# Patient Record
Sex: Female | Born: 1955 | Race: Black or African American | Hispanic: No | Marital: Single
Health system: Midwestern US, Community
[De-identification: ages and names within clinical notes are randomized; demographics above are authoritative.]

## PROBLEM LIST (undated history)

## (undated) DIAGNOSIS — R918 Other nonspecific abnormal finding of lung field: Secondary | ICD-10-CM

## (undated) DIAGNOSIS — L89154 Pressure ulcer of sacral region, stage 4: Secondary | ICD-10-CM

## (undated) DIAGNOSIS — Z1211 Encounter for screening for malignant neoplasm of colon: Secondary | ICD-10-CM

## (undated) DIAGNOSIS — I890 Lymphedema, not elsewhere classified: Secondary | ICD-10-CM

## (undated) DIAGNOSIS — Z1231 Encounter for screening mammogram for malignant neoplasm of breast: Secondary | ICD-10-CM

## (undated) DIAGNOSIS — R911 Solitary pulmonary nodule: Secondary | ICD-10-CM

## (undated) DIAGNOSIS — Z981 Arthrodesis status: Secondary | ICD-10-CM

## (undated) DIAGNOSIS — Z8614 Personal history of Methicillin resistant Staphylococcus aureus infection: Secondary | ICD-10-CM

## (undated) DIAGNOSIS — Z794 Long term (current) use of insulin: Secondary | ICD-10-CM

## (undated) DIAGNOSIS — I639 Cerebral infarction, unspecified: Secondary | ICD-10-CM

## (undated) DIAGNOSIS — E119 Type 2 diabetes mellitus without complications: Secondary | ICD-10-CM

## (undated) DIAGNOSIS — G959 Disease of spinal cord, unspecified: Secondary | ICD-10-CM

## (undated) DIAGNOSIS — M542 Cervicalgia: Principal | ICD-10-CM

## (undated) DIAGNOSIS — M25562 Pain in left knee: Principal | ICD-10-CM

## (undated) DIAGNOSIS — R531 Weakness: Principal | ICD-10-CM

## (undated) DIAGNOSIS — G3184 Mild cognitive impairment, so stated: Secondary | ICD-10-CM

## (undated) DIAGNOSIS — G8929 Other chronic pain: Secondary | ICD-10-CM

## (undated) DIAGNOSIS — I10 Essential (primary) hypertension: Secondary | ICD-10-CM

## (undated) DIAGNOSIS — M1990 Unspecified osteoarthritis, unspecified site: Secondary | ICD-10-CM

## (undated) DIAGNOSIS — K219 Gastro-esophageal reflux disease without esophagitis: Secondary | ICD-10-CM

## (undated) DIAGNOSIS — E785 Hyperlipidemia, unspecified: Secondary | ICD-10-CM

## (undated) DIAGNOSIS — I509 Heart failure, unspecified: Secondary | ICD-10-CM

## (undated) DIAGNOSIS — R51 Headache: Secondary | ICD-10-CM

## (undated) DIAGNOSIS — M5000 Cervical disc disorder with myelopathy, unspecified cervical region: Secondary | ICD-10-CM

## (undated) DIAGNOSIS — R519 Other chronic pain: Secondary | ICD-10-CM

## (undated) DIAGNOSIS — D509 Iron deficiency anemia, unspecified: Secondary | ICD-10-CM

## (undated) DIAGNOSIS — S0993XA Unspecified injury of face, initial encounter: Secondary | ICD-10-CM

## (undated) DIAGNOSIS — R053 Chronic cough: Secondary | ICD-10-CM

## (undated) DIAGNOSIS — R600 Localized edema: Secondary | ICD-10-CM

## (undated) DIAGNOSIS — E213 Hyperparathyroidism, unspecified: Secondary | ICD-10-CM

## (undated) DIAGNOSIS — M5416 Radiculopathy, lumbar region: Secondary | ICD-10-CM

## (undated) DIAGNOSIS — T148XXA Other injury of unspecified body region, initial encounter: Secondary | ICD-10-CM

## (undated) DIAGNOSIS — E611 Iron deficiency: Secondary | ICD-10-CM

## (undated) DIAGNOSIS — M79603 Pain in arm, unspecified: Secondary | ICD-10-CM

## (undated) DIAGNOSIS — M79671 Pain in right foot: Secondary | ICD-10-CM

## (undated) DIAGNOSIS — M79661 Pain in right lower leg: Secondary | ICD-10-CM

## (undated) DIAGNOSIS — R059 Cough, unspecified: Secondary | ICD-10-CM

## (undated) HISTORY — DX: Hyperlipidemia, unspecified: E78.5

## (undated) HISTORY — DX: Type 2 diabetes mellitus without complications: E11.9

## (undated) HISTORY — PX: OTHER SURGICAL HISTORY: SHX169

## (undated) HISTORY — DX: Headache: R51

## (undated) HISTORY — DX: Other chronic pain: R51.9

## (undated) HISTORY — DX: Cervical disc disorder with myelopathy, unspecified cervical region: M50.00

## (undated) HISTORY — PX: TUBAL LIGATION: SHX77

## (undated) HISTORY — DX: Gastro-esophageal reflux disease without esophagitis: K21.9

## (undated) HISTORY — DX: Unspecified osteoarthritis, unspecified site: M19.90

## (undated) HISTORY — DX: Other chronic pain: G89.29

## (undated) HISTORY — DX: Essential (primary) hypertension: I10

---

## 1997-11-09 ENCOUNTER — Encounter: Admission: RE | Admit: 1997-11-09 | Discharge: 1997-11-09 | Payer: Self-pay | Admitting: Hematology and Oncology

## 1997-12-04 ENCOUNTER — Encounter: Admission: RE | Admit: 1997-12-04 | Discharge: 1997-12-04 | Payer: Self-pay | Admitting: Internal Medicine

## 1998-01-03 ENCOUNTER — Encounter: Admission: RE | Admit: 1998-01-03 | Discharge: 1998-01-03 | Payer: Self-pay | Admitting: Internal Medicine

## 1998-02-02 ENCOUNTER — Encounter: Admission: RE | Admit: 1998-02-02 | Discharge: 1998-02-02 | Payer: Self-pay | Admitting: Internal Medicine

## 1998-03-13 ENCOUNTER — Encounter: Admission: RE | Admit: 1998-03-13 | Discharge: 1998-03-13 | Payer: Self-pay | Admitting: Hematology and Oncology

## 1998-04-17 ENCOUNTER — Encounter: Admission: RE | Admit: 1998-04-17 | Discharge: 1998-04-17 | Payer: Self-pay | Admitting: Obstetrics & Gynecology

## 1998-04-17 ENCOUNTER — Encounter: Admission: RE | Admit: 1998-04-17 | Discharge: 1998-04-17 | Payer: Self-pay | Admitting: Internal Medicine

## 1998-04-23 ENCOUNTER — Encounter: Admission: RE | Admit: 1998-04-23 | Discharge: 1998-07-22 | Payer: Self-pay | Admitting: *Deleted

## 1998-05-16 ENCOUNTER — Encounter: Admission: RE | Admit: 1998-05-16 | Discharge: 1998-05-16 | Payer: Self-pay | Admitting: Hematology and Oncology

## 1998-06-05 ENCOUNTER — Encounter: Payer: Self-pay | Admitting: Internal Medicine

## 1998-06-05 ENCOUNTER — Ambulatory Visit: Admission: RE | Admit: 1998-06-05 | Discharge: 1998-06-05 | Payer: Self-pay | Admitting: *Deleted

## 1998-06-07 ENCOUNTER — Encounter: Admission: RE | Admit: 1998-06-07 | Discharge: 1998-06-07 | Payer: Self-pay | Admitting: Obstetrics

## 1998-06-21 ENCOUNTER — Encounter: Admission: RE | Admit: 1998-06-21 | Discharge: 1998-06-21 | Payer: Self-pay | Admitting: Obstetrics

## 1998-08-15 ENCOUNTER — Encounter: Admission: RE | Admit: 1998-08-15 | Discharge: 1998-08-15 | Payer: Self-pay | Admitting: Hematology and Oncology

## 1998-08-16 ENCOUNTER — Other Ambulatory Visit: Admission: RE | Admit: 1998-08-16 | Discharge: 1998-08-16 | Payer: Self-pay | Admitting: Obstetrics

## 1998-08-16 ENCOUNTER — Encounter: Admission: RE | Admit: 1998-08-16 | Discharge: 1998-08-16 | Payer: Self-pay | Admitting: Obstetrics

## 1998-09-01 ENCOUNTER — Encounter: Payer: Self-pay | Admitting: Obstetrics

## 1998-09-01 ENCOUNTER — Ambulatory Visit (HOSPITAL_COMMUNITY): Admission: RE | Admit: 1998-09-01 | Discharge: 1998-09-01 | Payer: Self-pay | Admitting: Obstetrics

## 1998-09-27 ENCOUNTER — Encounter: Admission: RE | Admit: 1998-09-27 | Discharge: 1998-09-27 | Payer: Self-pay | Admitting: Obstetrics & Gynecology

## 1998-10-22 ENCOUNTER — Ambulatory Visit (HOSPITAL_COMMUNITY): Admission: RE | Admit: 1998-10-22 | Discharge: 1998-10-22 | Payer: Self-pay | Admitting: Internal Medicine

## 1998-10-22 ENCOUNTER — Encounter: Admission: RE | Admit: 1998-10-22 | Discharge: 1998-10-22 | Payer: Self-pay | Admitting: Internal Medicine

## 1998-11-28 ENCOUNTER — Encounter: Payer: Self-pay | Admitting: Internal Medicine

## 1998-11-28 ENCOUNTER — Ambulatory Visit (HOSPITAL_COMMUNITY): Admission: RE | Admit: 1998-11-28 | Discharge: 1998-11-28 | Payer: Self-pay | Admitting: Internal Medicine

## 1998-11-28 ENCOUNTER — Encounter: Admission: RE | Admit: 1998-11-28 | Discharge: 1998-11-28 | Payer: Self-pay | Admitting: Internal Medicine

## 1999-01-07 ENCOUNTER — Ambulatory Visit (HOSPITAL_COMMUNITY): Admission: RE | Admit: 1999-01-07 | Discharge: 1999-01-07 | Payer: Self-pay | Admitting: Internal Medicine

## 1999-01-07 ENCOUNTER — Encounter: Admission: RE | Admit: 1999-01-07 | Discharge: 1999-01-07 | Payer: Self-pay | Admitting: Internal Medicine

## 1999-01-25 ENCOUNTER — Encounter: Admission: RE | Admit: 1999-01-25 | Discharge: 1999-01-25 | Payer: Self-pay | Admitting: Internal Medicine

## 1999-03-28 ENCOUNTER — Encounter: Admission: RE | Admit: 1999-03-28 | Discharge: 1999-03-28 | Payer: Self-pay | Admitting: Obstetrics

## 1999-04-01 ENCOUNTER — Encounter: Payer: Self-pay | Admitting: Obstetrics

## 1999-04-12 ENCOUNTER — Encounter: Admission: RE | Admit: 1999-04-12 | Discharge: 1999-04-12 | Payer: Self-pay | Admitting: Obstetrics & Gynecology

## 1999-05-10 ENCOUNTER — Encounter: Admission: RE | Admit: 1999-05-10 | Discharge: 1999-05-10 | Payer: Self-pay | Admitting: Obstetrics & Gynecology

## 1999-05-13 ENCOUNTER — Encounter: Admission: RE | Admit: 1999-05-13 | Discharge: 1999-05-13 | Payer: Self-pay | Admitting: Hematology and Oncology

## 1999-06-07 ENCOUNTER — Encounter: Admission: RE | Admit: 1999-06-07 | Discharge: 1999-06-07 | Payer: Self-pay | Admitting: Obstetrics & Gynecology

## 1999-08-09 ENCOUNTER — Encounter: Admission: RE | Admit: 1999-08-09 | Discharge: 1999-08-09 | Payer: Self-pay | Admitting: Obstetrics & Gynecology

## 1999-08-12 ENCOUNTER — Encounter: Admission: RE | Admit: 1999-08-12 | Discharge: 1999-08-12 | Payer: Self-pay | Admitting: Internal Medicine

## 1999-08-26 ENCOUNTER — Ambulatory Visit (HOSPITAL_COMMUNITY): Admission: RE | Admit: 1999-08-26 | Discharge: 1999-08-26 | Payer: Self-pay | Admitting: *Deleted

## 1999-11-08 ENCOUNTER — Encounter: Admission: RE | Admit: 1999-11-08 | Discharge: 1999-11-08 | Payer: Self-pay | Admitting: Obstetrics & Gynecology

## 2000-01-08 ENCOUNTER — Encounter: Admission: RE | Admit: 2000-01-08 | Discharge: 2000-01-08 | Payer: Self-pay | Admitting: Internal Medicine

## 2000-01-09 ENCOUNTER — Encounter: Admission: RE | Admit: 2000-01-09 | Discharge: 2000-01-09 | Payer: Self-pay | Admitting: Obstetrics

## 2000-03-26 ENCOUNTER — Encounter: Admission: RE | Admit: 2000-03-26 | Discharge: 2000-03-26 | Payer: Self-pay | Admitting: Internal Medicine

## 2000-04-10 ENCOUNTER — Encounter: Admission: RE | Admit: 2000-04-10 | Discharge: 2000-04-10 | Payer: Self-pay | Admitting: Obstetrics

## 2000-06-17 ENCOUNTER — Encounter: Admission: RE | Admit: 2000-06-17 | Discharge: 2000-06-17 | Payer: Self-pay | Admitting: Internal Medicine

## 2000-06-17 ENCOUNTER — Ambulatory Visit (HOSPITAL_COMMUNITY): Admission: RE | Admit: 2000-06-17 | Discharge: 2000-06-17 | Payer: Self-pay | Admitting: Internal Medicine

## 2000-07-10 ENCOUNTER — Encounter: Admission: RE | Admit: 2000-07-10 | Discharge: 2000-07-10 | Payer: Self-pay | Admitting: Obstetrics & Gynecology

## 2000-10-02 ENCOUNTER — Encounter: Admission: RE | Admit: 2000-10-02 | Discharge: 2000-10-02 | Payer: Self-pay | Admitting: Obstetrics & Gynecology

## 2000-10-07 ENCOUNTER — Encounter: Admission: RE | Admit: 2000-10-07 | Discharge: 2000-10-07 | Payer: Self-pay | Admitting: Internal Medicine

## 2001-01-07 ENCOUNTER — Encounter: Admission: RE | Admit: 2001-01-07 | Discharge: 2001-01-07 | Payer: Self-pay | Admitting: Obstetrics

## 2001-01-13 ENCOUNTER — Encounter: Admission: RE | Admit: 2001-01-13 | Discharge: 2001-01-13 | Payer: Self-pay | Admitting: Internal Medicine

## 2001-04-06 ENCOUNTER — Encounter: Admission: RE | Admit: 2001-04-06 | Discharge: 2001-04-06 | Payer: Self-pay | Admitting: Internal Medicine

## 2001-07-09 ENCOUNTER — Encounter: Admission: RE | Admit: 2001-07-09 | Discharge: 2001-07-09 | Payer: Self-pay | Admitting: Obstetrics & Gynecology

## 2001-07-16 ENCOUNTER — Encounter: Admission: RE | Admit: 2001-07-16 | Discharge: 2001-07-16 | Payer: Self-pay | Admitting: Internal Medicine

## 2001-10-08 ENCOUNTER — Encounter: Admission: RE | Admit: 2001-10-08 | Discharge: 2001-10-08 | Payer: Self-pay | Admitting: *Deleted

## 2001-11-10 ENCOUNTER — Encounter: Admission: RE | Admit: 2001-11-10 | Discharge: 2001-11-10 | Payer: Self-pay

## 2002-01-06 ENCOUNTER — Encounter: Admission: RE | Admit: 2002-01-06 | Discharge: 2002-01-06 | Payer: Self-pay | Admitting: *Deleted

## 2002-01-11 ENCOUNTER — Ambulatory Visit (HOSPITAL_COMMUNITY): Admission: RE | Admit: 2002-01-11 | Discharge: 2002-01-11 | Payer: Self-pay | Admitting: Family Medicine

## 2002-03-07 ENCOUNTER — Encounter: Admission: RE | Admit: 2002-03-07 | Discharge: 2002-03-07 | Payer: Self-pay | Admitting: Internal Medicine

## 2002-03-11 ENCOUNTER — Ambulatory Visit (HOSPITAL_COMMUNITY): Admission: RE | Admit: 2002-03-11 | Discharge: 2002-03-11 | Payer: Self-pay | Admitting: Internal Medicine

## 2002-04-07 ENCOUNTER — Encounter: Admission: RE | Admit: 2002-04-07 | Discharge: 2002-04-07 | Payer: Self-pay | Admitting: Obstetrics and Gynecology

## 2002-07-07 ENCOUNTER — Encounter: Admission: RE | Admit: 2002-07-07 | Discharge: 2002-07-07 | Payer: Self-pay | Admitting: Obstetrics and Gynecology

## 2002-10-10 ENCOUNTER — Encounter: Admission: RE | Admit: 2002-10-10 | Discharge: 2002-10-10 | Payer: Self-pay | Admitting: Internal Medicine

## 2002-10-19 ENCOUNTER — Encounter: Payer: Self-pay | Admitting: Internal Medicine

## 2002-10-19 ENCOUNTER — Encounter: Admission: RE | Admit: 2002-10-19 | Discharge: 2002-10-19 | Payer: Self-pay | Admitting: Internal Medicine

## 2002-10-20 ENCOUNTER — Encounter: Payer: Self-pay | Admitting: Cardiology

## 2002-10-20 ENCOUNTER — Ambulatory Visit (HOSPITAL_COMMUNITY): Admission: RE | Admit: 2002-10-20 | Discharge: 2002-10-20 | Payer: Self-pay | Admitting: Internal Medicine

## 2003-01-09 ENCOUNTER — Encounter: Admission: RE | Admit: 2003-01-09 | Discharge: 2003-01-09 | Payer: Self-pay | Admitting: Internal Medicine

## 2003-04-11 ENCOUNTER — Encounter: Admission: RE | Admit: 2003-04-11 | Discharge: 2003-04-11 | Payer: Self-pay | Admitting: Internal Medicine

## 2003-05-03 ENCOUNTER — Encounter: Admission: RE | Admit: 2003-05-03 | Discharge: 2003-05-03 | Payer: Self-pay | Admitting: Internal Medicine

## 2003-07-04 ENCOUNTER — Encounter: Admission: RE | Admit: 2003-07-04 | Discharge: 2003-07-04 | Payer: Self-pay | Admitting: Internal Medicine

## 2003-10-02 ENCOUNTER — Encounter: Admission: RE | Admit: 2003-10-02 | Discharge: 2003-10-02 | Payer: Self-pay | Admitting: Internal Medicine

## 2003-10-20 ENCOUNTER — Encounter: Admission: RE | Admit: 2003-10-20 | Discharge: 2003-10-20 | Payer: Self-pay | Admitting: Internal Medicine

## 2003-12-25 ENCOUNTER — Encounter: Admission: RE | Admit: 2003-12-25 | Discharge: 2003-12-25 | Payer: Self-pay | Admitting: Internal Medicine

## 2004-02-09 ENCOUNTER — Encounter: Admission: RE | Admit: 2004-02-09 | Discharge: 2004-02-09 | Payer: Self-pay | Admitting: Internal Medicine

## 2004-03-18 ENCOUNTER — Encounter: Admission: RE | Admit: 2004-03-18 | Discharge: 2004-03-18 | Payer: Self-pay | Admitting: Internal Medicine

## 2004-06-07 ENCOUNTER — Ambulatory Visit: Payer: Self-pay | Admitting: Internal Medicine

## 2004-06-12 ENCOUNTER — Ambulatory Visit: Payer: Self-pay | Admitting: Internal Medicine

## 2004-08-30 ENCOUNTER — Ambulatory Visit: Payer: Self-pay | Admitting: Internal Medicine

## 2004-10-23 ENCOUNTER — Ambulatory Visit: Payer: Self-pay | Admitting: Internal Medicine

## 2004-11-13 ENCOUNTER — Ambulatory Visit (HOSPITAL_COMMUNITY): Admission: RE | Admit: 2004-11-13 | Discharge: 2004-11-13 | Payer: Self-pay | Admitting: Internal Medicine

## 2004-11-21 ENCOUNTER — Ambulatory Visit: Payer: Self-pay | Admitting: Internal Medicine

## 2005-01-21 ENCOUNTER — Ambulatory Visit: Payer: Self-pay | Admitting: Internal Medicine

## 2005-02-12 ENCOUNTER — Ambulatory Visit: Payer: Self-pay

## 2005-03-11 ENCOUNTER — Ambulatory Visit: Payer: Self-pay | Admitting: Internal Medicine

## 2005-03-18 ENCOUNTER — Ambulatory Visit: Payer: Self-pay | Admitting: Internal Medicine

## 2005-05-27 ENCOUNTER — Ambulatory Visit: Payer: Self-pay | Admitting: Internal Medicine

## 2005-08-22 ENCOUNTER — Ambulatory Visit: Payer: Self-pay | Admitting: Internal Medicine

## 2005-08-22 ENCOUNTER — Encounter (INDEPENDENT_AMBULATORY_CARE_PROVIDER_SITE_OTHER): Payer: Self-pay | Admitting: Internal Medicine

## 2005-08-22 LAB — CONVERTED CEMR LAB: Pap Smear: NORMAL

## 2005-08-27 ENCOUNTER — Encounter (INDEPENDENT_AMBULATORY_CARE_PROVIDER_SITE_OTHER): Payer: Self-pay | Admitting: Internal Medicine

## 2005-08-27 LAB — CONVERTED CEMR LAB: Pap Smear: NORMAL

## 2005-11-11 ENCOUNTER — Ambulatory Visit: Payer: Self-pay | Admitting: Hospitalist

## 2005-11-18 ENCOUNTER — Ambulatory Visit (HOSPITAL_COMMUNITY): Admission: RE | Admit: 2005-11-18 | Discharge: 2005-11-18 | Payer: Self-pay | Admitting: Pediatrics

## 2005-11-18 ENCOUNTER — Encounter (INDEPENDENT_AMBULATORY_CARE_PROVIDER_SITE_OTHER): Payer: Self-pay | Admitting: Cardiology

## 2006-02-26 ENCOUNTER — Ambulatory Visit (HOSPITAL_COMMUNITY): Admission: RE | Admit: 2006-02-26 | Discharge: 2006-02-26 | Payer: Self-pay | Admitting: Internal Medicine

## 2006-02-26 ENCOUNTER — Ambulatory Visit: Payer: Self-pay | Admitting: Hospitalist

## 2006-04-15 ENCOUNTER — Ambulatory Visit: Payer: Self-pay | Admitting: Internal Medicine

## 2006-04-20 ENCOUNTER — Ambulatory Visit (HOSPITAL_COMMUNITY): Admission: RE | Admit: 2006-04-20 | Discharge: 2006-04-20 | Payer: Self-pay | Admitting: Internal Medicine

## 2006-04-23 ENCOUNTER — Encounter: Admission: RE | Admit: 2006-04-23 | Discharge: 2006-04-23 | Payer: Self-pay | Admitting: Internal Medicine

## 2006-05-21 ENCOUNTER — Encounter (INDEPENDENT_AMBULATORY_CARE_PROVIDER_SITE_OTHER): Payer: Self-pay | Admitting: Internal Medicine

## 2006-05-21 ENCOUNTER — Ambulatory Visit: Payer: Self-pay | Admitting: Internal Medicine

## 2006-05-21 LAB — CONVERTED CEMR LAB
Free T4: 1.24 ng/dL (ref 0.89–1.80)
T4, Total: 10.2 ug/dL (ref 5.0–12.5)
TSH: 1.184 microintl units/mL (ref 0.350–5.50)

## 2006-08-04 DIAGNOSIS — E119 Type 2 diabetes mellitus without complications: Secondary | ICD-10-CM

## 2006-08-04 DIAGNOSIS — E785 Hyperlipidemia, unspecified: Secondary | ICD-10-CM

## 2006-08-04 HISTORY — DX: Type 2 diabetes mellitus without complications: E11.9

## 2006-08-04 HISTORY — DX: Hyperlipidemia, unspecified: E78.5

## 2006-08-11 ENCOUNTER — Encounter (INDEPENDENT_AMBULATORY_CARE_PROVIDER_SITE_OTHER): Payer: Self-pay | Admitting: Internal Medicine

## 2006-08-11 DIAGNOSIS — N921 Excessive and frequent menstruation with irregular cycle: Secondary | ICD-10-CM | POA: Insufficient documentation

## 2006-08-11 DIAGNOSIS — Z8639 Personal history of other endocrine, nutritional and metabolic disease: Secondary | ICD-10-CM

## 2006-08-11 DIAGNOSIS — E0865 Diabetes mellitus due to underlying condition with hyperglycemia: Secondary | ICD-10-CM | POA: Insufficient documentation

## 2006-08-11 DIAGNOSIS — E785 Hyperlipidemia, unspecified: Secondary | ICD-10-CM | POA: Insufficient documentation

## 2006-08-11 DIAGNOSIS — Z8669 Personal history of other diseases of the nervous system and sense organs: Secondary | ICD-10-CM | POA: Insufficient documentation

## 2006-08-11 DIAGNOSIS — E1149 Type 2 diabetes mellitus with other diabetic neurological complication: Secondary | ICD-10-CM | POA: Insufficient documentation

## 2006-08-11 DIAGNOSIS — Z9889 Other specified postprocedural states: Secondary | ICD-10-CM | POA: Insufficient documentation

## 2006-08-11 DIAGNOSIS — M1990 Unspecified osteoarthritis, unspecified site: Secondary | ICD-10-CM | POA: Insufficient documentation

## 2006-08-11 DIAGNOSIS — D500 Iron deficiency anemia secondary to blood loss (chronic): Secondary | ICD-10-CM | POA: Insufficient documentation

## 2006-08-11 DIAGNOSIS — I428 Other cardiomyopathies: Secondary | ICD-10-CM | POA: Insufficient documentation

## 2006-08-17 DIAGNOSIS — I10 Essential (primary) hypertension: Secondary | ICD-10-CM | POA: Insufficient documentation

## 2006-08-17 DIAGNOSIS — K219 Gastro-esophageal reflux disease without esophagitis: Secondary | ICD-10-CM | POA: Insufficient documentation

## 2006-09-03 ENCOUNTER — Ambulatory Visit: Payer: Self-pay | Admitting: Hospitalist

## 2006-09-03 DIAGNOSIS — E049 Nontoxic goiter, unspecified: Secondary | ICD-10-CM | POA: Insufficient documentation

## 2006-09-09 ENCOUNTER — Ambulatory Visit: Payer: Self-pay | Admitting: Internal Medicine

## 2006-09-09 ENCOUNTER — Ambulatory Visit (HOSPITAL_COMMUNITY): Admission: RE | Admit: 2006-09-09 | Discharge: 2006-09-09 | Payer: Self-pay | Admitting: Internal Medicine

## 2006-09-11 ENCOUNTER — Telehealth (INDEPENDENT_AMBULATORY_CARE_PROVIDER_SITE_OTHER): Payer: Self-pay | Admitting: *Deleted

## 2006-09-14 ENCOUNTER — Encounter (INDEPENDENT_AMBULATORY_CARE_PROVIDER_SITE_OTHER): Payer: Self-pay | Admitting: Internal Medicine

## 2006-09-14 ENCOUNTER — Telehealth (INDEPENDENT_AMBULATORY_CARE_PROVIDER_SITE_OTHER): Payer: Self-pay | Admitting: *Deleted

## 2006-09-14 DIAGNOSIS — N83209 Unspecified ovarian cyst, unspecified side: Secondary | ICD-10-CM | POA: Insufficient documentation

## 2006-09-15 ENCOUNTER — Encounter (INDEPENDENT_AMBULATORY_CARE_PROVIDER_SITE_OTHER): Payer: Self-pay | Admitting: Internal Medicine

## 2006-09-22 ENCOUNTER — Telehealth (INDEPENDENT_AMBULATORY_CARE_PROVIDER_SITE_OTHER): Payer: Self-pay | Admitting: Hospitalist

## 2006-09-28 ENCOUNTER — Telehealth (INDEPENDENT_AMBULATORY_CARE_PROVIDER_SITE_OTHER): Payer: Self-pay | Admitting: *Deleted

## 2006-10-02 ENCOUNTER — Ambulatory Visit: Payer: Self-pay | Admitting: Internal Medicine

## 2006-10-02 ENCOUNTER — Encounter (INDEPENDENT_AMBULATORY_CARE_PROVIDER_SITE_OTHER): Payer: Self-pay | Admitting: Internal Medicine

## 2006-10-02 LAB — CONVERTED CEMR LAB
Cholesterol: 124 mg/dL (ref 0–200)
HCT: 45.7 % (ref 36.0–46.0)
HDL: 34 mg/dL — ABNORMAL LOW (ref >39)
Hemoglobin: 15.4 g/dL — ABNORMAL HIGH (ref 12.0–15.0)
Platelets: 249 10*3/uL (ref 150–400)
TSH: 1.185 microintl units/mL (ref 0.350–5.50)
Total CHOL/HDL Ratio: 3.6
WBC: 7 10*3/uL (ref 4.0–10.5)

## 2006-12-02 ENCOUNTER — Encounter (INDEPENDENT_AMBULATORY_CARE_PROVIDER_SITE_OTHER): Payer: Self-pay | Admitting: Internal Medicine

## 2006-12-02 ENCOUNTER — Ambulatory Visit: Payer: Self-pay | Admitting: Internal Medicine

## 2006-12-02 ENCOUNTER — Ambulatory Visit (HOSPITAL_COMMUNITY): Admission: RE | Admit: 2006-12-02 | Discharge: 2006-12-02 | Payer: Self-pay | Admitting: Hospitalist

## 2006-12-02 DIAGNOSIS — R52 Pain, unspecified: Secondary | ICD-10-CM | POA: Insufficient documentation

## 2006-12-02 DIAGNOSIS — R0789 Other chest pain: Secondary | ICD-10-CM | POA: Insufficient documentation

## 2006-12-02 LAB — CONVERTED CEMR LAB
Blood Glucose, Fingerstick: 235
Hgb A1c MFr Bld: 9.8 %

## 2006-12-03 LAB — CONVERTED CEMR LAB
Calcium: 8.7 mg/dL (ref 8.4–10.5)
Chloride: 105 meq/L (ref 96–112)
Glucose, Bld: 218 mg/dL — ABNORMAL HIGH (ref 70–99)
Sed Rate: 37 mm/hr — ABNORMAL HIGH (ref 0–22)
Vitamin B-12: 609 pg/mL (ref 211–911)

## 2007-01-28 ENCOUNTER — Telehealth: Payer: Self-pay | Admitting: *Deleted

## 2007-02-25 ENCOUNTER — Ambulatory Visit: Payer: Self-pay | Admitting: *Deleted

## 2007-02-25 ENCOUNTER — Encounter (INDEPENDENT_AMBULATORY_CARE_PROVIDER_SITE_OTHER): Payer: Self-pay | Admitting: Internal Medicine

## 2007-02-25 LAB — CONVERTED CEMR LAB: Hgb A1c MFr Bld: 9 %

## 2007-02-27 LAB — CONVERTED CEMR LAB
ALT: 15 units/L (ref 0–35)
AST: 15 units/L (ref 0–37)
Albumin: 3.8 g/dL (ref 3.5–5.2)
Alkaline Phosphatase: 120 units/L — ABNORMAL HIGH (ref 39–117)
Anti Nuclear Antibody(ANA): NEGATIVE
Calcium: 8.6 mg/dL (ref 8.4–10.5)
Chloride: 107 meq/L (ref 96–112)
Creatinine, Ser: 0.53 mg/dL (ref 0.40–1.20)
Glucose, Bld: 179 mg/dL — ABNORMAL HIGH (ref 70–99)
Potassium: 3.7 meq/L (ref 3.5–5.3)
Total Bilirubin: 0.8 mg/dL (ref 0.3–1.2)
Total Protein: 7.1 g/dL (ref 6.0–8.3)

## 2007-03-24 ENCOUNTER — Encounter (INDEPENDENT_AMBULATORY_CARE_PROVIDER_SITE_OTHER): Payer: Self-pay | Admitting: Internal Medicine

## 2007-05-03 ENCOUNTER — Encounter (INDEPENDENT_AMBULATORY_CARE_PROVIDER_SITE_OTHER): Payer: Self-pay | Admitting: Internal Medicine

## 2007-05-03 ENCOUNTER — Ambulatory Visit: Payer: Self-pay | Admitting: Internal Medicine

## 2007-05-03 LAB — CONVERTED CEMR LAB: Blood Glucose, Fingerstick: 255

## 2007-05-04 LAB — CONVERTED CEMR LAB
ALT: 16 units/L (ref 0–35)
Albumin: 4.2 g/dL (ref 3.5–5.2)
BUN: 16 mg/dL (ref 6–23)
Calcium: 9.5 mg/dL (ref 8.4–10.5)
Chloride: 103 meq/L (ref 96–112)
Cholesterol: 118 mg/dL (ref 0–200)
Creatinine, Ser: 0.72 mg/dL (ref 0.40–1.20)
Creatinine, Urine: 420.3 mg/dL
Glucose, Bld: 251 mg/dL — ABNORMAL HIGH (ref 70–99)
Microalb Creat Ratio: 10.5 mg/g (ref 0.0–30.0)
Platelets: 299 10*3/uL (ref 150–400)
RBC: 5.37 M/uL — ABNORMAL HIGH (ref 3.87–5.11)
Total CHOL/HDL Ratio: 3
Total Protein: 8 g/dL (ref 6.0–8.3)
VLDL: 19 mg/dL (ref 0–40)

## 2007-05-05 ENCOUNTER — Encounter (INDEPENDENT_AMBULATORY_CARE_PROVIDER_SITE_OTHER): Payer: Self-pay | Admitting: Internal Medicine

## 2007-05-17 ENCOUNTER — Ambulatory Visit: Payer: Self-pay | Admitting: Infectious Diseases

## 2007-05-17 ENCOUNTER — Encounter (INDEPENDENT_AMBULATORY_CARE_PROVIDER_SITE_OTHER): Payer: Self-pay | Admitting: Infectious Diseases

## 2007-05-17 LAB — CONVERTED CEMR LAB
BUN: 15 mg/dL (ref 6–23)
CO2: 22 meq/L (ref 19–32)
Hgb A1c MFr Bld: 8.8 %
Potassium: 4 meq/L (ref 3.5–5.3)
Sodium: 139 meq/L (ref 135–145)

## 2007-07-05 ENCOUNTER — Encounter (INDEPENDENT_AMBULATORY_CARE_PROVIDER_SITE_OTHER): Payer: Self-pay | Admitting: Internal Medicine

## 2007-07-15 ENCOUNTER — Encounter: Admission: RE | Admit: 2007-07-15 | Discharge: 2007-07-15 | Payer: Self-pay | Admitting: Neurology

## 2007-07-19 ENCOUNTER — Encounter (INDEPENDENT_AMBULATORY_CARE_PROVIDER_SITE_OTHER): Payer: Self-pay | Admitting: Internal Medicine

## 2007-08-09 ENCOUNTER — Telehealth: Payer: Self-pay | Admitting: Internal Medicine

## 2007-08-10 ENCOUNTER — Telehealth: Payer: Self-pay | Admitting: *Deleted

## 2007-08-10 ENCOUNTER — Encounter: Payer: Self-pay | Admitting: Internal Medicine

## 2007-08-17 ENCOUNTER — Telehealth: Payer: Self-pay | Admitting: *Deleted

## 2007-08-18 ENCOUNTER — Ambulatory Visit: Payer: Self-pay | Admitting: Internal Medicine

## 2007-09-03 ENCOUNTER — Encounter (INDEPENDENT_AMBULATORY_CARE_PROVIDER_SITE_OTHER): Payer: Self-pay | Admitting: Internal Medicine

## 2007-09-08 ENCOUNTER — Encounter (INDEPENDENT_AMBULATORY_CARE_PROVIDER_SITE_OTHER): Payer: Self-pay | Admitting: Internal Medicine

## 2007-09-20 ENCOUNTER — Telehealth (INDEPENDENT_AMBULATORY_CARE_PROVIDER_SITE_OTHER): Payer: Self-pay | Admitting: Internal Medicine

## 2007-09-20 ENCOUNTER — Encounter (INDEPENDENT_AMBULATORY_CARE_PROVIDER_SITE_OTHER): Payer: Self-pay | Admitting: Internal Medicine

## 2007-09-27 ENCOUNTER — Encounter: Admission: RE | Admit: 2007-09-27 | Discharge: 2007-09-27 | Payer: Self-pay | Admitting: Sports Medicine

## 2007-10-12 ENCOUNTER — Encounter (INDEPENDENT_AMBULATORY_CARE_PROVIDER_SITE_OTHER): Payer: Self-pay | Admitting: Internal Medicine

## 2007-11-08 ENCOUNTER — Encounter (INDEPENDENT_AMBULATORY_CARE_PROVIDER_SITE_OTHER): Payer: Self-pay | Admitting: Internal Medicine

## 2007-12-23 ENCOUNTER — Ambulatory Visit: Payer: Self-pay | Admitting: Internal Medicine

## 2007-12-23 ENCOUNTER — Encounter (INDEPENDENT_AMBULATORY_CARE_PROVIDER_SITE_OTHER): Payer: Self-pay | Admitting: Internal Medicine

## 2008-06-14 ENCOUNTER — Ambulatory Visit: Payer: Self-pay | Admitting: *Deleted

## 2008-06-14 LAB — CONVERTED CEMR LAB
ALT: 13 units/L (ref 0–35)
Albumin: 4.3 g/dL (ref 3.5–5.2)
Alkaline Phosphatase: 103 units/L (ref 39–117)
BUN: 18 mg/dL (ref 6–23)
Basophils Relative: 0 % (ref 0–1)
Blood Glucose, Fingerstick: 127
CO2: 19 meq/L (ref 19–32)
Chloride: 105 meq/L (ref 96–112)
Cholesterol: 129 mg/dL (ref 0–200)
Creatinine, Ser: 0.58 mg/dL (ref 0.40–1.20)
Creatinine, Urine: 251.1 mg/dL
Digitoxin Lvl: 0.5 ng/mL — ABNORMAL LOW (ref 0.8–2.0)
Eosinophils Absolute: 0.1 10*3/uL (ref 0.0–0.7)
Eosinophils Relative: 2 % (ref 0–5)
Glucose, Bld: 107 mg/dL — ABNORMAL HIGH (ref 70–99)
HDL: 33 mg/dL — ABNORMAL LOW (ref >39)
Lymphocytes Relative: 34 % (ref 12–46)
Microalb Creat Ratio: 4.4 mg/g (ref 0.0–30.0)
Monocytes Absolute: 0.6 10*3/uL (ref 0.1–1.0)
RBC: 5.11 M/uL (ref 3.87–5.11)
Sodium: 139 meq/L (ref 135–145)
Triglycerides: 131 mg/dL (ref <150)
WBC: 8.7 10*3/uL (ref 4.0–10.5)

## 2008-07-10 ENCOUNTER — Ambulatory Visit: Payer: Self-pay | Admitting: Gastroenterology

## 2008-07-19 ENCOUNTER — Telehealth: Payer: Self-pay | Admitting: Gastroenterology

## 2008-07-19 LAB — HEMOGLOBIN A1C WITH EAG: Hemoglobin A1c: 6.5 % — ABNORMAL HIGH (ref 4.8–6.0)

## 2008-07-21 ENCOUNTER — Ambulatory Visit: Payer: Self-pay | Admitting: Gastroenterology

## 2008-07-21 ENCOUNTER — Ambulatory Visit (HOSPITAL_COMMUNITY): Admission: RE | Admit: 2008-07-21 | Discharge: 2008-07-21 | Payer: Self-pay | Admitting: Gastroenterology

## 2008-07-21 LAB — HM COLONOSCOPY: HM Colonoscopy: NORMAL

## 2008-08-15 ENCOUNTER — Telehealth (INDEPENDENT_AMBULATORY_CARE_PROVIDER_SITE_OTHER): Payer: Self-pay | Admitting: Pharmacy Technician

## 2008-08-15 ENCOUNTER — Telehealth (INDEPENDENT_AMBULATORY_CARE_PROVIDER_SITE_OTHER): Payer: Self-pay | Admitting: Internal Medicine

## 2008-10-09 ENCOUNTER — Ambulatory Visit: Payer: Self-pay | Admitting: *Deleted

## 2008-10-09 ENCOUNTER — Encounter (INDEPENDENT_AMBULATORY_CARE_PROVIDER_SITE_OTHER): Payer: Self-pay | Admitting: Internal Medicine

## 2008-10-09 LAB — CONVERTED CEMR LAB: Blood Glucose, Fingerstick: 112

## 2008-10-25 LAB — METABOLIC PANEL, COMPREHENSIVE
A-G Ratio: 1 (ref 0.8–1.7)
ALT (SGPT): 78 U/L — ABNORMAL HIGH (ref 30–65)
AST (SGOT): 47 U/L — ABNORMAL HIGH (ref 15–37)
Albumin: 3.8 g/dL (ref 3.4–5.0)
Alk. phosphatase: 91 U/L (ref 50–136)
Anion gap: 15 mmol/L (ref 5–15)
BUN/Creatinine ratio: 13 (ref 12–20)
BUN: 10 MG/DL (ref 7–18)
Bilirubin, total: 0.2 MG/DL (ref 0.1–0.9)
CO2: 25 MMOL/L (ref 21–32)
Calcium: 9.5 MG/DL (ref 8.4–10.4)
Chloride: 102 MMOL/L (ref 100–108)
Creatinine: 0.8 MG/DL (ref 0.6–1.3)
GFR est AA: >60 mL/min/{1.73_m2} (ref >60)
GFR est non-AA: >60 mL/min/{1.73_m2} (ref >60)
Globulin: 3.7 g/dL (ref 2.0–4.0)
Glucose: 63 MG/DL — ABNORMAL LOW (ref 74–99)
Potassium: 4.3 MMOL/L (ref 3.5–5.5)
Protein, total: 7.5 g/dL (ref 6.4–8.2)
Sodium: 142 MMOL/L (ref 136–145)

## 2008-10-25 LAB — MICROALBUMIN, UR, RAND W/ MICROALB/CREAT RATIO
Creatinine, urine random: 57 MG/DL (ref 30.0–125.0)
Microalbumin,urine random: 1.1 MG/DL — ABNORMAL HIGH (ref <0.20)
Microalbumin/Creat ratio (mg/g creat): 19 mg/g (ref 0–30)

## 2008-10-25 LAB — TSH 3RD GENERATION: TSH: 1.11 u[IU]/mL (ref 0.51–6.27)

## 2008-10-25 LAB — LIPID PANEL
CHOL/HDL Ratio: 3.1 (ref 0–5.0)
Cholesterol, total: 123 MG/DL (ref 0–200)
HDL Cholesterol: 40 MG/DL (ref 40–60)
LDL, calculated: 69.2 MG/DL (ref 0–100)
LDL/HDL Ratio: 1.7
Triglyceride: 69 MG/DL (ref 0–150)
VLDL, calculated: 13.8 MG/DL

## 2008-10-25 LAB — CORTISOL: Cortisol, random: 11 ug/dL (ref 3.09–22.40)

## 2008-10-25 LAB — HEMOGLOBIN A1C WITH EAG: Hemoglobin A1c: 6.5 % — ABNORMAL HIGH (ref 4.8–6.0)

## 2008-10-26 LAB — INSULIN-LIKE GROWTH FACTOR 1: Insulin-like Growth Factor: 172 ng/mL (ref 49–292)

## 2008-11-14 LAB — MICROALBUMIN, UR, RAND W/ MICROALB/CREAT RATIO
Creatinine, urine random: 30.2 MG/DL (ref 30.0–125.0)
Microalbumin,urine random: 1 MG/DL — ABNORMAL HIGH (ref <0.20)
Microalbumin/Creat ratio (mg/g creat): 33 mg/g — ABNORMAL HIGH (ref 0–30)

## 2008-11-14 LAB — METABOLIC PANEL, COMPREHENSIVE
A-G Ratio: 1.1 (ref 0.8–1.7)
ALT (SGPT): 67 U/L — ABNORMAL HIGH (ref 30–65)
AST (SGOT): 26 U/L (ref 15–37)
Albumin: 3.9 g/dL (ref 3.4–5.0)
Alk. phosphatase: 113 U/L (ref 50–136)
Anion gap: 15 mmol/L (ref 5–15)
BUN/Creatinine ratio: 10 — ABNORMAL LOW (ref 12–20)
BUN: 7 MG/DL (ref 7–18)
Bilirubin, total: 0.2 MG/DL (ref 0.1–0.9)
CO2: 23 MMOL/L (ref 21–32)
Calcium: 9.4 MG/DL (ref 8.4–10.4)
Chloride: 101 MMOL/L (ref 100–108)
Creatinine: 0.7 MG/DL (ref 0.6–1.3)
GFR est AA: >60 mL/min/{1.73_m2} (ref >60)
GFR est non-AA: >60 mL/min/{1.73_m2} (ref >60)
Globulin: 3.6 g/dL (ref 2.0–4.0)
Glucose: 147 MG/DL — ABNORMAL HIGH (ref 74–99)
Potassium: 3.9 MMOL/L (ref 3.5–5.5)
Protein, total: 7.5 g/dL (ref 6.4–8.2)
Sodium: 139 MMOL/L (ref 136–145)

## 2008-11-14 LAB — BILIRUBIN, DIRECT: Bilirubin, direct: 0.2 MG/DL (ref 0.0–0.3)

## 2008-11-14 LAB — T4, FREE: T4, Free: 0.9 NG/DL (ref 0.89–1.76)

## 2008-11-14 LAB — TSH 3RD GENERATION: TSH: 1.12 u[IU]/mL (ref 0.51–6.27)

## 2009-01-04 ENCOUNTER — Encounter (INDEPENDENT_AMBULATORY_CARE_PROVIDER_SITE_OTHER): Payer: Self-pay | Admitting: Internal Medicine

## 2009-02-01 LAB — HSV CULTURE WITHOUT TYPING

## 2009-03-23 LAB — CBC WITH AUTOMATED DIFF
ABS. EOSINOPHILS: 0.1 10*3/uL (ref 0.0–0.4)
ABS. LYMPHOCYTES: 1.5 10*3/uL (ref 0.8–3.5)
ABS. MONOCYTES: 0.7 10*3/uL (ref 0–1.0)
ABS. NEUTROPHILS: 4.6 10*3/uL (ref 1.8–8.0)
BASOPHILS: 1 % (ref 0–3)
EOSINOPHILS: 2 % (ref 0–5)
HCT: 30.2 % — ABNORMAL LOW (ref 36.0–46.0)
HGB: 9.9 g/dL — ABNORMAL LOW (ref 12.0–16.0)
LYMPHOCYTES: 22 % (ref 20–51)
MCH: 22.4 PG — ABNORMAL LOW (ref 25.0–35.0)
MCHC: 32.7 g/dL (ref 31.0–37.0)
MCV: 68.6 FL — ABNORMAL LOW (ref 78.0–102.0)
MONOCYTES: 10 % — ABNORMAL HIGH (ref 2–9)
MPV: 7.5 FL (ref 7.4–10.4)
NEUTROPHILS: 65 % (ref 42–75)
PLATELET: 301 10*3/uL (ref 130–400)
RBC: 4.41 M/uL (ref 4.10–5.10)
RDW: 19.4 % — ABNORMAL HIGH (ref 11.5–14.5)
WBC: 7 10*3/uL (ref 4.5–13.0)

## 2009-03-23 LAB — METABOLIC PANEL, COMPREHENSIVE
A-G Ratio: 1 (ref 0.8–1.7)
ALT (SGPT): 56 U/L (ref 30–65)
AST (SGOT): 32 U/L (ref 15–37)
Albumin: 3.4 g/dL (ref 3.4–5.0)
Alk. phosphatase: 81 U/L (ref 50–136)
Anion gap: 8 mmol/L (ref 5–15)
BUN/Creatinine ratio: 16 (ref 12–20)
BUN: 11 MG/DL (ref 7–18)
Bilirubin, total: 0.2 MG/DL (ref 0.1–0.9)
CO2: 27 MMOL/L (ref 21–32)
Calcium: 9.3 MG/DL (ref 8.4–10.4)
Chloride: 104 MMOL/L (ref 100–108)
Creatinine: 0.7 MG/DL (ref 0.6–1.3)
GFR est AA: >60 mL/min/{1.73_m2} (ref >60)
GFR est non-AA: >60 mL/min/{1.73_m2} (ref >60)
Globulin: 3.3 g/dL (ref 2.0–4.0)
Glucose: 52 MG/DL — ABNORMAL LOW (ref 74–99)
Potassium: 4.4 MMOL/L (ref 3.5–5.5)
Protein, total: 6.7 g/dL (ref 6.4–8.2)
Sodium: 139 MMOL/L (ref 136–145)

## 2009-03-23 LAB — LIPID PANEL
CHOL/HDL Ratio: 3.2 (ref 0–5.0)
Cholesterol, total: 113 MG/DL (ref 0–200)
HDL Cholesterol: 35 MG/DL — ABNORMAL LOW (ref 40–60)
LDL, calculated: 61.6 MG/DL (ref 0–100)
Triglyceride: 82 MG/DL (ref 0–150)
VLDL, calculated: 16.4 MG/DL

## 2009-03-24 LAB — MICROALBUMIN, UR, RAND W/ MICROALB/CREAT RATIO
Creatinine, urine random: 135.8 mg/dL — ABNORMAL HIGH (ref 30.0–125.0)
Microalbumin,urine random: 0.2 MG/DL — ABNORMAL HIGH (ref <0.20)
Microalbumin/Creat ratio (mg/g creat): 1 mg/g (ref 0–30)

## 2009-03-24 LAB — HEMOGLOBIN A1C WITH EAG: Hemoglobin A1c: 5.9 % (ref 4.8–6.0)

## 2009-03-25 LAB — VITAMIN D, 25 HYROXY PANEL
25-OH Vitamin D2 + D3: 25.2 ng/mL — ABNORMAL LOW (ref 30.0–80.0)
25-OH Vitamin D2: 6.1 ng/mL
25-OH Vitamin D3: 19.1 ng/mL

## 2009-04-30 ENCOUNTER — Encounter: Payer: Self-pay | Admitting: Internal Medicine

## 2009-04-30 ENCOUNTER — Ambulatory Visit: Payer: Self-pay | Admitting: Internal Medicine

## 2009-04-30 DIAGNOSIS — R109 Unspecified abdominal pain: Secondary | ICD-10-CM | POA: Insufficient documentation

## 2009-04-30 LAB — CONVERTED CEMR LAB
Blood Glucose, AC Bkfst: 112 mg/dL
Hgb A1c MFr Bld: 6.7 %

## 2009-05-02 ENCOUNTER — Ambulatory Visit (HOSPITAL_COMMUNITY): Admission: RE | Admit: 2009-05-02 | Discharge: 2009-05-02 | Payer: Self-pay | Admitting: Internal Medicine

## 2009-05-02 LAB — HM MAMMOGRAPHY: HM Mammogram: NEGATIVE

## 2009-05-10 ENCOUNTER — Encounter (INDEPENDENT_AMBULATORY_CARE_PROVIDER_SITE_OTHER): Payer: Self-pay | Admitting: Internal Medicine

## 2009-06-04 ENCOUNTER — Ambulatory Visit: Payer: Self-pay | Admitting: Internal Medicine

## 2009-06-04 LAB — HM DIABETES FOOT EXAM

## 2009-06-05 ENCOUNTER — Encounter: Payer: Self-pay | Admitting: Internal Medicine

## 2009-06-05 LAB — CONVERTED CEMR LAB
ALT: 15 units/L (ref 0–35)
AST: 14 units/L (ref 0–37)
Alkaline Phosphatase: 97 units/L (ref 39–117)
BUN: 12 mg/dL (ref 6–23)
CO2: 20 meq/L (ref 19–32)
Chloride: 109 meq/L (ref 96–112)
Cholesterol: 109 mg/dL (ref 0–200)
Creatinine, Ser: 0.62 mg/dL (ref 0.40–1.20)
Glucose, Bld: 94 mg/dL (ref 70–99)
HCT: 42.8 % (ref 36.0–46.0)
Microalb, Ur: 0.8 mg/dL (ref 0.00–1.89)
Potassium: 4.3 meq/L (ref 3.5–5.3)
RBC: 4.77 M/uL (ref 3.87–5.11)
RDW: 13.1 % (ref 11.5–15.5)
Sodium: 141 meq/L (ref 135–145)
Total Bilirubin: 0.4 mg/dL (ref 0.3–1.2)
Total Protein: 7.1 g/dL (ref 6.0–8.3)
Triglycerides: 88 mg/dL (ref <150)

## 2009-06-14 ENCOUNTER — Telehealth (INDEPENDENT_AMBULATORY_CARE_PROVIDER_SITE_OTHER): Payer: Self-pay | Admitting: *Deleted

## 2009-07-10 ENCOUNTER — Telehealth: Payer: Self-pay | Admitting: Internal Medicine

## 2009-09-19 ENCOUNTER — Telehealth: Payer: Self-pay | Admitting: Internal Medicine

## 2009-10-11 ENCOUNTER — Ambulatory Visit: Payer: Self-pay | Admitting: Internal Medicine

## 2009-11-19 ENCOUNTER — Telehealth: Payer: Self-pay | Admitting: Internal Medicine

## 2010-03-12 ENCOUNTER — Ambulatory Visit: Payer: Self-pay | Admitting: Internal Medicine

## 2010-03-12 LAB — CONVERTED CEMR LAB
Blood Glucose, AC Bkfst: 130 mg/dL
Hgb A1c MFr Bld: 6.3 %

## 2010-03-20 ENCOUNTER — Telehealth: Payer: Self-pay | Admitting: Internal Medicine

## 2010-04-01 ENCOUNTER — Telehealth: Payer: Self-pay | Admitting: *Deleted

## 2010-05-02 ENCOUNTER — Telehealth: Payer: Self-pay | Admitting: *Deleted

## 2010-05-16 ENCOUNTER — Telehealth: Payer: Self-pay | Admitting: Internal Medicine

## 2010-09-03 NOTE — Assessment & Plan Note (Signed)
Summary: est-ck/fu/meds/cfb   Vital Signs:  Patient profile:   55 year old female Height:      63.5 inches (161.29 cm) Weight:      278.9 pounds (126.77 kg) BMI:     48.81 Temp:     97.1 degrees F (36.17 degrees C) oral Pulse rate:   100 / minute BP sitting:   108 / 88  (right arm)  Vitals Entered By: Stanton Kidney Ditzler RN (March 12, 2010 3:30 PM) Is Patient Diabetic? Yes Did you bring your meter with you today? Yes Pain Assessment Patient in pain? yes     Location: back and legs Intensity: 4 Type: aching Onset of pain  past 4 years Nutritional Status BMI of 25 - 29 = overweight Nutritional Status Detail appetite good  Have you ever been in a relationship where you felt threatened, hurt or afraid?denies   Does patient need assistance? Functional Status Self care Ambulation Normal Comments Refills on meds - wants paper Rx. Nonproductive cough. Ck-up. Past 4 years nausea, discomfort low abd and breath has odor.   Primary Care Provider:  Elisabeth Most, MD   History of Present Illness: 55 yo female with PMHx outlined below presents to [facility] for regular follow up appointment. She has no concerns at the time. No recent sicknesses or hospitalizaitons. No episodes of chest pain, SOB, palpitations, no fever or chills. No specific abdominal or urinary concerns. No recent changes in appetite, weight, sleep patterns, mood.    Depression History:      The patient denies a depressed mood most of the day and a diminished interest in her usual daily activities.  The patient denies significant weight loss, significant weight gain, insomnia, hypersomnia, psychomotor agitation, psychomotor retardation, fatigue (loss of energy), feelings of worthlessness (guilt), impaired concentration (indecisiveness), and recurrent thoughts of death or suicide.        The patient denies that she feels like life is not worth living, denies that she wishes that she were dead, and denies that she has thought  about ending her life.         Preventive Screening-Counseling & Management  Alcohol-Tobacco     Alcohol drinks/day: 0     Smoking Status: current     Smoking Cessation Counseling: yes     Packs/Day: 5 cigs per day     Year Started: 1970  Caffeine-Diet-Exercise     Does Patient Exercise: yes     Type of exercise: LEG LIFT / ARM LIFTS / SIT UPS     Times/week: 3  Problems Prior to Update: 1)  Abdominal Pain  (ICD-789.00) 2)  Headache  (ICD-784.0) 3)  Special Screening For Malignant Neoplasms Colon  (ICD-V76.51) 4)  Screening For Malignant Neoplasm of The Cervix  (ICD-V76.2) 5)  Pain, Generalized  (ICD-780.96) 6)  Chest Pain, Atypical  (ICD-786.59) 7)  Ovarian Cyst  (ICD-620.2) 8)  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 Take  1 Tablet By Mouth Two Times A Day 2)  Glipizide 10 Mg Tabs (Glipizide) .... Take 2 Tablets By Mouth Two Times A Day 3)  Prilosec 20 Mg Cpdr (Omeprazole) .... Take 1 Tablet By Mouth Two Times A Day 4)  Adult Aspirin Ec Low Strength 81 Mg  Tbec (Aspirin) .... Take 1 Tablet By Mouth Once A Day 5)  K-Lor 20 Meq Pack (Potassium Chloride) .... Take 1 Tablet By Mouth Once A Day 6)  Digoxin 0.125 Mg Tabs (Digoxin) .... Take 1 Tablet By Mouth Once A Day 7)  Furosemide 80 Mg Tabs (Furosemide) .... Take 1 Tablet By Mouth Once A Day 8)  Calcarb 600/d 600-125 Mg-Unit Tabs (Calcium-Vitamin D) .... Take 1  Tablet By Mouth Once A Day 9)  Naproxen 500 Mg Tabs (Naproxen) .... Take 1 Tablet By Mouth Two Times A Day 10)  Simvastatin 40 Mg  Tabs (Simvastatin) .... Take 1 Tablet By Mouth Once A Day 11)  Lantus Solostar 100 Unit/ml Soln (Insulin Glargine) .... 30 Units Subcutaneously At Bedtime and in The Morning. 12)  Pen Needles 5/16" 31g X 8 Mm Misc (Insulin Pen Needle) 13)  Ascensia Contour Test  Strp (Glucose Blood) .... To Test 3  Times A Day 14)  Bd Ultra-Fine 33 Lancets  Misc (Lancets) .... 33 Gauge- Needs 60/month 15)  Miralax  Powd (Polyethylene Glycol 3350) .... As Instructed On The Package 16)  Toprol Xl 50 Mg  Tb24 (Metoprolol Succinate) .... Take 1 Tablet By Mouth Once A Day 17)  Percocet 5-325 Mg Tabs (Oxycodone-Acetaminophen) .... Take 1 Tablet Twice Daily As Needed For Pain  Current Medications (verified): 1)  Enalapril Maleate 20 Mg Tabs (Enalapril Maleate) .... Take 1 Tablet By Mouth Two Times A Day 2)  Glipizide 10 Mg Tabs (Glipizide) .... Take 2 Tablets By Mouth Two Times A Day 3)  Prilosec 20 Mg Cpdr (Omeprazole) .... Take 1 Tablet By Mouth Two Times A Day 4)  Adult Aspirin Ec Low Strength 81 Mg  Tbec (Aspirin) .... Take 1 Tablet By Mouth Once A Day 5)  K-Lor 20 Meq Pack (Potassium Chloride) .... Take 1 Tablet By Mouth Once A Day 6)  Digoxin 0.125 Mg Tabs (Digoxin) .... Take 1 Tablet By Mouth Once A Day 7)  Furosemide 80 Mg Tabs (Furosemide) .... Take 1 Tablet By Mouth Once A Day 8)  Calcarb 600/d 600-125 Mg-Unit Tabs (Calcium-Vitamin D) .... Take 1 Tablet By Mouth Once A Day 9)  Naproxen 500 Mg Tabs (Naproxen) .... Take 1 Tablet By Mouth Two Times A Day 10)  Simvastatin 40 Mg  Tabs (Simvastatin) .... Take 1 Tablet By Mouth Once A Day 11)  Lantus Solostar 100 Unit/ml Soln (Insulin Glargine) .... 30 Units Subcutaneously At Bedtime and in The Morning. 12)  Pen Needles 5/16" 31g X 8 Mm Misc (Insulin Pen Needle) 13)  Ascensia Contour Test  Strp (Glucose Blood) .... To Test 3  Times A  Day 14)  Bd Ultra-Fine 33 Lancets  Misc (Lancets) .... 33 Gauge- Needs 60/month 15)  Miralax  Powd (Polyethylene Glycol 3350) .... As Instructed On The Package 16)  Toprol Xl 50 Mg  Tb24 (Metoprolol Succinate) .... Take 1 Tablet By Mouth Once A Day 17)  Percocet 5-325 Mg Tabs (Oxycodone-Acetaminophen) .... Take 1 Tablet Twice Daily As Needed For Pain  Allergies: 1)  Claritin-D 24 Hour (Loratadine-Pseudoephedrine)  Past History:  Past Medical History: Last updated: 08/11/2006 Diabetes mellitus, type II, non-insulin dependent Hyperlipidemia Degenerative joint disease (Xray:  bilateral sacroiliac degen. ds., early degen. ds. bilateral knees. MRI cervical spine: no signif. 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 Hypertension tobacco abuse Normal thyroid tests 05/2006 GERD  Past Surgical History: Last updated: 07/10/2008 Carpal tunnel release Gallstones removed Tubal Ligation  Family History: Last updated: 07/10/2008 Family History of Diabetes: mother Family History of Heart Disease: mother Family History of Prostate Cancer:father  Social History: Last updated: 07/10/2008 Current Smoker 5 cig./day Occupation: unemployed Alcohol Use - no  Risk Factors: Alcohol Use: 0 (03/12/2010) Exercise: yes (03/12/2010)  Risk Factors: Smoking Status: current (03/12/2010) Packs/Day: 5 cigs per day (03/12/2010)  Family History: Reviewed history from 07/10/2008 and no changes required. Family History of Diabetes: mother Family History of Heart Disease: mother Family History of Prostate Cancer:father  Social History: Reviewed history from 07/10/2008 and no changes required. Current Smoker 5 cig./day Occupation: unemployed Alcohol Use - no Packs/Day:  5 cigs per day  Review of Systems       per  HPI  Physical Exam  General:  Well-developed,well-nourished,in no acute distress; alert,appropriate and cooperative throughout examination Lungs:  Normal respiratory effort, chest expands symmetrically. Lungs are clear to auscultation, no crackles or wheezes. Heart:  Normal rate and regular rhythm. S1 and S2 normal without gallop, murmur, click, rub or other extra sounds. Abdomen:  Bowel sounds positive,abdomen soft and non-tender without masses, organomegaly or hernias noted. Neurologic:  No cranial nerve deficits noted. Station and gait are normal. Plantar reflexes are down-going bilaterally. DTRs are symmetrical throughout. Sensory, motor and coordinative functions appear intact. Psych:  Cognition and judgment appear intact. Alert and cooperative with normal attention span and concentration. No apparent delusions, illusions, hallucinations   Impression & Recommendations:  Problem # 1:  HYPERTENSION (ICD-401.9) At goal, will cont the same regimen.  Her updated medication list for this problem includes:    Enalapril Maleate 20 Mg Tabs (Enalapril maleate) .Marland Kitchen... Take 1 tablet by mouth two times a day    Furosemide 80 Mg Tabs (Furosemide) .Marland Kitchen... Take 1 tablet by mouth once a day    Toprol Xl 50 Mg Tb24 (Metoprolol succinate) .Marland Kitchen... Take 1 tablet by mouth once a day  BP today: 108/88 Prior BP: 130/70 (10/11/2009)  Labs Reviewed: K+: 4.3 (06/05/2009) Creat: : 0.62 (06/05/2009)   Chol: 109 (06/05/2009)   HDL: 35 (06/05/2009)   LDL: 56 (06/05/2009)   TG: 88 (06/05/2009)  Problem # 2:  GERD (ICD-530.81) Cont the same regimen. Her updated medication list for this problem includes:    Protonix 40 Mg Tbec (Pantoprazole sodium) .Marland Kitchen... Take 1 tablet by mouth once a day for 3 months  Problem # 3:  TOBACCO ABUSE (ICD-305.1)  Encouraged smoking cessation and discussed different methods for smoking cessation.   Problem # 4:  HYPERLIPIDEMIA (ICD-272.4) Pt wants to have it repeated on her next  visit. Her updated medication list for this problem includes:    Simvastatin 40 Mg Tabs (Simvastatin) .Marland Kitchen... Take 1 tablet by mouth once a day  Labs Reviewed: SGOT: 14 (06/05/2009)   SGPT: 15 (06/05/2009)   HDL:35 (06/05/2009), 33 (06/14/2008)  LDL:56 (06/05/2009), 70 (06/14/2008)  Chol:109 (06/05/2009), 129 (06/14/2008)  Trig:88 (06/05/2009), 131 (06/14/2008)  Problem # 5:  DIABETES MELLITUS, TYPE II (ICD-250.00) A1C improving, she is up to date on her eye examinations, will cont the same regimen.  Her updated medication  list for this problem includes:    Enalapril Maleate 20 Mg Tabs (Enalapril maleate) .Marland Kitchen... Take 1 tablet by mouth two times a day    Glipizide 10 Mg Tabs (Glipizide) .Marland Kitchen... Take 2 tablets by mouth two times a day    Adult Aspirin Ec Low Strength 81 Mg Tbec (Aspirin) .Marland Kitchen... Take 1 tablet by mouth once a day    Lantus Solostar 100 Unit/ml Soln (Insulin glargine) .Marland KitchenMarland KitchenMarland KitchenMarland Kitchen 30 units subcutaneously at bedtime and in the morning.  Orders: T- Capillary Blood Glucose (16109) T-Hgb A1C (in-house) ([id number])  Labs Reviewed: Creat: 0.62 (06/05/2009)    Reviewed HgBA1c results: 6.3 (03/12/2010)  7.3 (10/11/2009)  Complete Medication List: 1)  Enalapril Maleate 20 Mg Tabs (Enalapril maleate) .... Take 1 tablet by mouth two times a day 2)  Glipizide 10 Mg Tabs (Glipizide) .... Take 2 tablets by mouth two times a day 3)  Protonix 40 Mg Tbec (Pantoprazole sodium) .... Take 1 tablet by mouth once a day for 3 months 4)  Adult Aspirin Ec Low Strength 81 Mg Tbec (Aspirin) .... Take 1 tablet by mouth once a day 5)  K-lor 20 Meq Pack (Potassium chloride) .... Take 1 tablet by mouth once a day 6)  Digoxin 0.125 Mg Tabs (Digoxin) .... Take 1 tablet by mouth once a day 7)  Furosemide 80 Mg Tabs (Furosemide) .... Take 1 tablet by mouth once a day 8)  Calcarb 600/d 600-125 Mg-unit Tabs (Calcium-vitamin d) .... Take 1 tablet by mouth once a day 9)  Simvastatin 40 Mg Tabs (Simvastatin) .... Take 1  tablet by mouth once a day 10)  Lantus Solostar 100 Unit/ml Soln (Insulin glargine) .... 30 units subcutaneously at bedtime and in the morning. 11)  Pen Needles 5/16" 31g X 8 Mm Misc (Insulin pen needle) 12)  Ascensia Contour Test Strp (Glucose blood) .... To test 3  times a day 13)  Bd Ultra-fine 33 Lancets Misc (Lancets) .... 33 gauge- needs 60/month 14)  Miralax Powd (Polyethylene glycol 3350) .... As instructed on the package 15)  Toprol Xl 50 Mg Tb24 (Metoprolol succinate) .... Take 1 tablet by mouth once a day 16)  Percocet 5-325 Mg Tabs (Oxycodone-acetaminophen) .... Take 1 tablet twice daily as needed for pain 17)  Robaxin 500 Mg Tabs (Methocarbamol) .... Take 1 tablet twice daily as needed for muscle spasm  Patient Instructions: 1)  Please schedule a follow-up appointment in 3 months. Prescriptions: PERCOCET 5-325 MG TABS (OXYCODONE-ACETAMINOPHEN) take 1 tablet twice daily as needed for pain  #30 x 0   Entered and Authorized by:   Mliss Sax MD   Signed by:   Mliss Sax MD on 03/12/2010   Method used:   Print then Give to Patient   RxID:   [id number] TOPROL XL 50 MG  TB24 (METOPROLOL SUCCINATE) Take 1 tablet by mouth once a day  #31 x 11   Entered and Authorized by:   Mliss Sax MD   Signed by:   Mliss Sax MD on 03/12/2010   Method used:   Print then Give to Patient   RxID:   [id number] BD ULTRA-FINE 33 LANCETS  MISC (LANCETS) 33 gauge- needs 60/month  #90 x prn   Entered and Authorized by:   Mliss Sax MD   Signed by:   Mliss Sax MD on 03/12/2010   Method used:   Print then Give to Patient   RxID:   [id number] ASCENSIA CONTOUR TEST  STRP (GLUCOSE BLOOD) to test 3  times a  day  #for 1 month x prn   Entered and Authorized by:   Mliss Sax MD   Signed by:   Mliss Sax MD on 03/12/2010   Method used:   Print then Give to Patient   RxID:   [id number] PEN NEEDLES 5/16" 31G X 8 MM MISC (INSULIN PEN NEEDLE)   #for 1 month x prn   Entered  and Authorized by:   Mliss Sax MD   Signed by:   Mliss Sax MD on 03/12/2010   Method used:   Print then Give to Patient   RxID:   [id number] LANTUS SOLOSTAR 100 UNIT/ML SOLN (INSULIN GLARGINE) 30 units subcutaneously at bedtime and in the morning.  #1 month supl x 11   Entered and Authorized by:   Mliss Sax MD   Signed by:   Mliss Sax MD on 03/12/2010   Method used:   Print then Give to Patient   RxID:   [id number] SIMVASTATIN 40 MG  TABS (SIMVASTATIN) Take 1 tablet by mouth once a day  #31 x 11   Entered and Authorized by:   Mliss Sax MD   Signed by:   Mliss Sax MD on 03/12/2010   Method used:   Print then Give to Patient   RxID:   [id number] CALCARB 600/D 600-125 MG-UNIT TABS (CALCIUM-VITAMIN D) Take 1 tablet by mouth once a day  #31 x 11   Entered and Authorized by:   Mliss Sax MD   Signed by:   Mliss Sax MD on 03/12/2010   Method used:   Print then Give to Patient   RxID:   [id number] FUROSEMIDE 80 MG TABS (FUROSEMIDE) Take 1 tablet by mouth once a day  #31 x 11   Entered and Authorized by:   Mliss Sax MD   Signed by:   Mliss Sax MD on 03/12/2010   Method used:   Print then Give to Patient   RxID:   [id number] DIGOXIN 0.125 MG TABS (DIGOXIN) Take 1 tablet by mouth once a day  #31 x 11   Entered and Authorized by:   Mliss Sax MD   Signed by:   Mliss Sax MD on 03/12/2010   Method used:   Print then Give to Patient   RxID:   [id number] K-LOR 20 MEQ PACK (POTASSIUM CHLORIDE) Take 1 tablet by mouth once a day  #31 x 11   Entered and Authorized by:   Mliss Sax MD   Signed by:   Mliss Sax MD on 03/12/2010   Method used:   Print then Give to Patient   RxID:   [id number] ADULT ASPIRIN EC LOW STRENGTH 81 MG  TBEC (ASPIRIN) Take 1 tablet by mouth once a day  #30 x 3   Entered and Authorized by:   Mliss Sax MD   Signed by:   Mliss Sax MD on 03/12/2010   Method used:   Print then Give to Patient    RxID:   [id number] GLIPIZIDE 10 MG TABS (GLIPIZIDE) Take 2 tablets by mouth two times a day  #120 x 11   Entered and Authorized by:   Mliss Sax MD   Signed by:   Mliss Sax MD on 03/12/2010   Method used:   Print then Give to Patient   RxID:   [id number] ENALAPRIL MALEATE 20 MG TABS (ENALAPRIL MALEATE) Take 1 tablet by mouth two times a day  #30 x 11   Entered and Authorized by:   Mliss Sax MD  Signed by:   Mliss Sax MD on 03/12/2010   Method used:   Print then Give to Patient   RxID:   [id number] ROBAXIN 500 MG TABS (METHOCARBAMOL) take 1 tablet twice daily as needed for muscle spasm  #40 x 0   Entered and Authorized by:   Mliss Sax MD   Signed by:   Mliss Sax MD on 03/12/2010   Method used:   Print then Give to Patient   RxID:   [id number] PROTONIX 40 MG TBEC (PANTOPRAZOLE SODIUM) Take 1 tablet by mouth once a day for 3 months  #90 x 3   Entered and Authorized by:   Mliss Sax MD   Signed by:   Mliss Sax MD on 03/12/2010   Method used:   Print then Give to Patient   RxID:   [id number]    Prevention & Chronic Care Immunizations   Influenza vaccine: pt. refused  (04/30/2009)   Influenza vaccine deferral: Refused  (06/04/2009)    Tetanus booster: Not documented   Td booster deferral: Refused  (06/04/2009)    Pneumococcal vaccine: Not documented  Colorectal Screening   Hemoccult: Not documented   Hemoccult action/deferral: Refused  (06/04/2009)    Colonoscopy: Location:  [facility].    (07/21/2008)   Colonoscopy due: 07/2018  Other Screening   Pap smear:  Specimen Adequacy: Satisfactory for evaluation.   Interpretation/Result:Negative for intraepithelial Lesion or Malignancy.   Location: [facility].    (12/23/2007)   Pap smear action/deferral: Deferred-3 yr interval  (06/04/2009)   Pap smear due: 01/2009    Mammogram: ASSESSMENT: Negative - BI-RADS 1^MM DIGITAL SCREENING  (05/02/2009)    Mammogram action/deferral: Ordered  (04/30/2009)   Smoking status: current  (03/12/2010)   Smoking cessation counseling: yes  (03/12/2010)  Diabetes Mellitus   HgbA1C: 6.3  (03/12/2010)    Eye exam: Not documented    Foot exam: yes  (06/04/2009)   Foot exam action/deferral: Do today   High risk foot: No  (04/30/2009)   Foot care education: Done  (04/30/2009)    Urine microalbumin/creatinine ratio: 5.5  (06/05/2009)    Diabetes flowsheet reviewed?: Yes   Progress toward A1C goal: At goal  Lipids   Total Cholesterol: 109  (06/05/2009)   LDL: 56  (06/05/2009)   LDL Direct: Not documented   HDL: 35  (06/05/2009)   Triglycerides: 88  (06/05/2009)    SGOT (AST): 14  (06/05/2009)   SGPT (ALT): 15  (06/05/2009)   Alkaline phosphatase: 97  (06/05/2009)   Total bilirubin: 0.4  (06/05/2009)    Lipid flowsheet reviewed?: Yes   Progress toward LDL goal: At goal  Hypertension   Last Blood Pressure: 108 / 88  (03/12/2010)   Serum creatinine: 0.62  (06/05/2009)   Serum potassium 4.3  (06/05/2009)    Hypertension flowsheet reviewed?: Yes   Progress toward BP goal: At goal  Self-Management Support :   Personal Goals (by the next clinic visit) :     Personal A1C goal: 7  (03/12/2010)     Personal blood pressure goal: 130/80  (03/12/2010)     Personal LDL goal: 100  (03/12/2010)    Patient will work on the following items until the next clinic visit to reach self-care goals:     Medications and monitoring: take my medicines every day, check my blood sugar, check my blood pressure, bring all of my medications to every visit, weigh myself weekly, examine my feet every day  (03/12/2010)  Eating: drink diet soda or water instead of juice or soda, eat more vegetables, use fresh or frozen vegetables, eat fruit for snacks and desserts, limit or avoid alcohol  (03/12/2010)     Activity: take a 30 minute walk every day  (03/12/2010)    Diabetes self-management support: Copy of home  glucose meter record, Written self-care plan, Education handout, Resources for patients handout  (03/12/2010)   Diabetes care plan printed   Diabetes education handout printed    Hypertension self-management support: Written self-care plan, Education handout, Resources for patients handout  (03/12/2010)   Hypertension self-care plan printed.   Hypertension education handout printed    Lipid self-management support: Written self-care plan, Education handout, Resources for patients handout  (03/12/2010)   Lipid self-care plan printed.   Lipid education handout printed      Resource handout printed.  Laboratory Results   Blood Tests   Date/Time Received: March 12, 2010 3:39 PM  Date/Time Reported: Burke Keels  March 12, 2010 3:39 PM   HGBA1C: 6.3%   (Normal Range: Non-Diabetic - 3-6%   Control Diabetic - 6-8%) CBG Fasting:: 130mg /dL

## 2010-09-03 NOTE — Assessment & Plan Note (Signed)
Summary: EST-CK/FU/MEDS/CFB   Vital Signs:  Patient profile:   55 year old female Height:      63.5 inches Weight:      287.3 pounds BMI:     50.28 Temp:     99.5 degrees F oral Pulse rate:   80 / minute BP sitting:   130 / 70  (right arm)  Vitals Entered By: Filomena Jungling NT II (October 11, 2009 3:42 PM) CC: check-up, need refills Is Patient Diabetic? Yes Did you bring your meter with you today? No Pain Assessment Patient in pain? no      Nutritional Status BMI of > 30 = obese CBG Result 109  Have you ever been in a relationship where you felt threatened, hurt or afraid?No   Does patient need assistance? Functional Status Self care Ambulation Normal   Primary Care Provider:  Elisabeth Most, MD  CC:  check-up and need refills.  History of Present Illness: 55 yo female with PMH outlined below presents today for regular follow up. She has one concern at the time, she had one episode of fall 4 yrs ago and she has been having somewhat continuous back pain with no significant relief with ibuprofen or tylenol. She was exercising as recommended and was able to loose 40 lbs but she still experience back pain. She denies any fever, chills, no chest apin, no paliptaions, no syncopla episodes, no weakness, she is able to walk but pain usually prevents her from walking longer distances. She denies any other systemic symptoms. Denies urinary or baldder incontinance, no abdominal or urinary concerns.   Preventive Screening-Counseling & Management  Alcohol-Tobacco     Alcohol drinks/day: 0     Smoking Status: current     Smoking Cessation Counseling: yes     Packs/Day: 6 per day     Year Started: 1970  Caffeine-Diet-Exercise     Does Patient Exercise: yes     Type of exercise: LEG LIFT / ARM LIFTS / SIT UPS     Times/week: 3  Problems Prior to Update: 1)  Abdominal Pain  (ICD-789.00) 2)  Headache  (ICD-784.0) 3)  Special Screening For Malignant Neoplasms Colon  (ICD-V76.51) 4)   Screening For Malignant Neoplasm of The Cervix  (ICD-V76.2) 5)  Pain, Generalized  (ICD-780.96) 6)  Chest Pain, Atypical  (ICD-786.59) 7)  Ovarian Cyst  (ICD-620.2) 8)  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 Take 1 Tablet By Mouth Two Times A Day 2)  Glipizide 10 Mg Tabs (Glipizide) .... Take 2 Tablets By Mouth Two Times A Day 3)  Prilosec 20 Mg Cpdr (Omeprazole) .... Take 1 Tablet By Mouth Two Times A Day 4)  Adult Aspirin Ec Low Strength 81 Mg  Tbec (Aspirin) .... Take 1 Tablet By Mouth Once A Day 5)  K-Lor 20 Meq Pack (Potassium Chloride) .... Take 1 Tablet By Mouth Once A Day 6)  Digoxin 0.125 Mg Tabs (Digoxin) .Marland KitchenMarland KitchenMarland Kitchen  Take 1 Tablet By Mouth Once A Day 7)  Furosemide 80 Mg Tabs (Furosemide) .... Take 1 Tablet By Mouth Once A Day 8)  Calcarb 600/d 600-125 Mg-Unit Tabs (Calcium-Vitamin D) .... Take 1 Tablet By Mouth Once A Day 9)  Naproxen 500 Mg Tabs (Naproxen) .... Take 1 Tablet By Mouth Two Times A Day 10)  Simvastatin 40 Mg  Tabs (Simvastatin) .... Take 1 Tablet By Mouth Once A Day 11)  Lantus Solostar 100 Unit/ml Soln (Insulin Glargine) .... 30 Units Subcutaneously At Bedtime and in The Morning. 12)  Pen Needles 5/16" 31g X 8 Mm Misc (Insulin Pen Needle) 13)  Ascensia Contour Test  Strp (Glucose Blood) .... To Test 3  Times A Day 14)  Bd Ultra-Fine 33 Lancets  Misc (Lancets) .... 33 Gauge- Needs 60/month 15)  Miralax  Powd  (Polyethylene Glycol 3350) .... As Instructed On The Package 16)  Toprol Xl 50 Mg  Tb24 (Metoprolol Succinate) .... Take 1 Tablet By Mouth Once A Day  Current Medications (verified): 1)  Enalapril Maleate 20 Mg Tabs (Enalapril Maleate) .... Take 1 Tablet By Mouth Two Times A Day 2)  Glipizide 10 Mg Tabs (Glipizide) .... Take 2 Tablets By Mouth Two Times A Day 3)  Prilosec 20 Mg Cpdr (Omeprazole) .... Take 1 Tablet By Mouth Two Times A Day 4)  Adult Aspirin Ec Low Strength 81 Mg  Tbec (Aspirin) .... Take 1 Tablet By Mouth Once A Day 5)  K-Lor 20 Meq Pack (Potassium Chloride) .... Take 1 Tablet By Mouth Once A Day 6)  Digoxin 0.125 Mg Tabs (Digoxin) .... Take 1 Tablet By Mouth Once A Day 7)  Furosemide 80 Mg Tabs (Furosemide) .... Take 1 Tablet By Mouth Once A Day 8)  Calcarb 600/d 600-125 Mg-Unit Tabs (Calcium-Vitamin D) .... Take 1 Tablet By Mouth Once A Day 9)  Naproxen 500 Mg Tabs (Naproxen) .... Take 1 Tablet By Mouth Two Times A Day 10)  Simvastatin 40 Mg  Tabs (Simvastatin) .... Take 1 Tablet By Mouth Once A Day 11)  Lantus Solostar 100 Unit/ml Soln (Insulin Glargine) .... 30 Units Subcutaneously At Bedtime and in The Morning. 12)  Pen Needles 5/16" 31g X 8 Mm Misc (Insulin Pen Needle) 13)  Ascensia Contour Test  Strp (Glucose Blood) .... To Test 3  Times A Day 14)  Bd Ultra-Fine 33 Lancets  Misc (Lancets) .... 33 Gauge- Needs 60/month 15)  Miralax  Powd (Polyethylene Glycol 3350) .... As Instructed On The Package 16)  Toprol Xl 50 Mg  Tb24 (Metoprolol Succinate) .... Take 1 Tablet By Mouth Once A Day  Allergies (verified): 1)  Claritin-D 24 Hour (Loratadine-Pseudoephedrine)  Past History:  Past Medical History: Last updated: 08/11/2006 Diabetes mellitus, type II, non-insulin dependent Hyperlipidemia Degenerative joint disease (Xray: bilateral sacroiliac degen. ds., early degen. ds. bilateral knees. MRI cervical spine: no signif. 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 Hypertension tobacco abuse Normal thyroid tests 05/2006 GERD  Past Surgical History: Last updated: 07/10/2008 Carpal tunnel release Gallstones removed Tubal Ligation  Family History: Last updated: 07/10/2008 Family History of Diabetes: mother Family History of Heart Disease: mother Family History of Prostate Cancer:father  Social History: Last updated: 07/10/2008 Current Smoker 5 cig./day Occupation: unemployed Alcohol Use - no  Risk Factors: Alcohol Use: 0 (10/11/2009) Exercise: yes (10/11/2009)  Risk Factors:  Smoking Status: current (10/11/2009) Packs/Day: 6 per day (10/11/2009)  Family History: Reviewed history from 07/10/2008 and no changes required. Family History of Diabetes: mother Family History of Heart Disease: mother Family History of Prostate Cancer:father  Social History: Reviewed history from 07/10/2008 and no changes required. Current Smoker 5 cig./day Occupation: unemployed Alcohol Use - no  Review of Systems       per HPI  Physical Exam  General:  Well-developed,well-nourished,in no acute distress; alert,appropriate and cooperative throughout examination Lungs:  Normal respiratory effort, chest expands symmetrically. Lungs are clear to auscultation, no crackles or wheezes. Heart:  Normal rate and regular rhythm. S1 and S2 normal without gallop, murmur, click, rub or other extra sounds. Abdomen:  Bowel sounds positive,abdomen soft and non-tender without masses, organomegaly or hernias noted.   Detailed Back/Spine Exam  Lumbosacral Exam:  Inspection-deformity:    Normal Palpation-spinal tenderness:  Normal    spinal tenderness B at the L4 level Range of Motion:    Forward Flexion:   15 degrees    Hyperextension:   10 degrees    Right Lateral Bend:   15  degrees    Left Lateral Bend:   15 degrees Squatting:      dificculty Lying Straight Leg Raise:    Right:  negative    Left:  negative Sitting Straight Leg Raise:    Right:  negative    Left:  negative Reverse Straight Leg Raise:    Right:  negative    Left:  negative Contralateral Straight Leg Raise:    Right:  negative    Left:  negative Sciatic Notch:    There is no sciatic notch tenderness. Toe Walking:    Right:  abnormal    Left:  abnormal Heel Walking:    Right:  abnormal    Left:  abnormal   Impression & Recommendations:  Problem # 1:  DEGENERATIVE JOINT DISEASE (ICD-715.90) In the spine as well. I have decided to let the patient try low dose percocet for pain control and will see if she thinks if that helps. i will only give her 30 tabs for now and maybe will need pain contract in the future. Will continue to monitor. I have encouraged her to continue exercising and following the recommended diet as this have helped her loose weight.  Her updated medication list for this problem includes:    Adult Aspirin Ec Low Strength 81 Mg Tbec (Aspirin) .Marland Kitchen... Take 1 tablet by mouth once a day    Naproxen 500 Mg Tabs (Naproxen) .Marland Kitchen... Take 1 tablet by mouth two times a day    Percocet 5-325 Mg Tabs (Oxycodone-acetaminophen) .Marland Kitchen... Take 1 tablet twice daily as needed for pain  Problem # 2:  HYPERTENSION (ICD-401.9) At goal, continue to monitor. Her updated medication list for this problem includes:    Enalapril Maleate 20 Mg Tabs (Enalapril maleate) .Marland Kitchen... Take 1 tablet by mouth two times a day    Furosemide 80 Mg Tabs (Furosemide) .Marland Kitchen... Take 1 tablet by mouth once a day    Toprol Xl 50 Mg Tb24 (Metoprolol succinate) .Marland Kitchen... Take 1 tablet by mouth once a day  BP today: 130/70 Prior BP: 132/86 (06/04/2009)  Labs Reviewed: K+: 4.3 (06/05/2009) Creat: : 0.62 (06/05/2009)   Chol: 109 (06/05/2009)   HDL: 35 (06/05/2009)   LDL: 56 (06/05/2009)   TG: 88 (06/05/2009)  Problem # 3:   HYPERLIPIDEMIA (ICD-272.4) Will check FLP on her next appointment.  Her updated medication list for this problem  includes:    Simvastatin 40 Mg Tabs (Simvastatin) .Marland Kitchen... Take 1 tablet by mouth once a day  Labs Reviewed: SGOT: 14 (06/05/2009)   SGPT: 15 (06/05/2009)   HDL:35 (06/05/2009), 33 (06/14/2008)  LDL:56 (06/05/2009), 70 (06/14/2008)  Chol:109 (06/05/2009), 129 (06/14/2008)  Trig:88 (06/05/2009), 131 (06/14/2008)  Problem # 4:  DIABETES MELLITUS, TYPE II (ICD-250.00) No changes today, I have encouraged her to brign her monitor with her next time so that we can review it and readjust the regimen.  Her updated medication list for this problem includes:    Glipizide 10 Mg Tabs (Glipizide) .Marland Kitchen... Take 2 tablets by mouth two times a day    Lantus Solostar 100 Unit/ml Soln (Insulin glargine) .Marland KitchenMarland KitchenMarland KitchenMarland Kitchen 30 units subcutaneously at bedtime and in the morning.  Orders: T- Capillary Blood Glucose (86578) T-Hgb A1C (in-house) (46962XB)  Labs Reviewed: Creat: 0.62 (06/05/2009)    Reviewed HgBA1c results: 7.3 (10/11/2009)  6.7 (04/30/2009)  Complete Medication List: 1)  Enalapril Maleate 20 Mg Tabs (Enalapril maleate) .... Take 1 tablet by mouth two times a day 2)  Glipizide 10 Mg Tabs (Glipizide) .... Take 2 tablets by mouth two times a day 3)  Prilosec 20 Mg Cpdr (Omeprazole) .... Take 1 tablet by mouth two times a day 4)  Adult Aspirin Ec Low Strength 81 Mg Tbec (Aspirin) .... Take 1 tablet by mouth once a day 5)  K-lor 20 Meq Pack (Potassium chloride) .... Take 1 tablet by mouth once a day 6)  Digoxin 0.125 Mg Tabs (Digoxin) .... Take 1 tablet by mouth once a day 7)  Furosemide 80 Mg Tabs (Furosemide) .... Take 1 tablet by mouth once a day 8)  Calcarb 600/d 600-125 Mg-unit Tabs (Calcium-vitamin d) .... Take 1 tablet by mouth once a day 9)  Naproxen 500 Mg Tabs (Naproxen) .... Take 1 tablet by mouth two times a day 10)  Simvastatin 40 Mg Tabs (Simvastatin) .... Take 1 tablet by mouth once a  day 11)  Lantus Solostar 100 Unit/ml Soln (Insulin glargine) .... 30 units subcutaneously at bedtime and in the morning. 12)  Pen Needles 5/16" 31g X 8 Mm Misc (Insulin pen needle) 13)  Ascensia Contour Test Strp (Glucose blood) .... To test 3  times a day 14)  Bd Ultra-fine 33 Lancets Misc (Lancets) .... 33 gauge- needs 60/month 15)  Miralax Powd (Polyethylene glycol 3350) .... As instructed on the package 16)  Toprol Xl 50 Mg Tb24 (Metoprolol succinate) .... Take 1 tablet by mouth once a day 17)  Percocet 5-325 Mg Tabs (Oxycodone-acetaminophen) .... Take 1 tablet twice daily as needed for pain  Patient Instructions: 1)  Please schedule a follow-up appointment in 3 months. 2)  Check your blood sugars regularly. If your readings are usually above 350 : or below 70 you should contact our office. 3)  It is important that your Diabetic A1c level is checked every 3 months. 4)  Check your feet each night for sore areas, calluses or signs of infection. 5)  Check your Blood Pressure regularly. If it is above 170: you should make an appointment. Prescriptions: PERCOCET 5-325 MG TABS (OXYCODONE-ACETAMINOPHEN) take 1 tablet twice daily as needed for pain  #30 x 0   Entered and Authorized by:   Mliss Sax MD   Signed by:   Mliss Sax MD on 10/11/2009   Method used:   Print then Give to Patient   RxID:   [id number]     Prevention & Chronic Care Immunizations   Influenza  vaccine: pt. refused  (04/30/2009)   Influenza vaccine deferral: Refused  (06/04/2009)    Tetanus booster: Not documented   Td booster deferral: Refused  (06/04/2009)    Pneumococcal vaccine: Not documented  Colorectal Screening   Hemoccult: Not documented   Hemoccult action/deferral: Refused  (06/04/2009)    Colonoscopy: Location:  [facility].    (07/21/2008)   Colonoscopy due: 07/2018  Other Screening   Pap smear:  Specimen Adequacy: Satisfactory for evaluation.   Interpretation/Result:Negative  for intraepithelial Lesion or Malignancy.   Location: [facility].    (12/23/2007)   Pap smear action/deferral: Deferred-3 yr interval  (06/04/2009)   Pap smear due: 01/2009    Mammogram: ASSESSMENT: Negative - BI-RADS 1^MM DIGITAL SCREENING  (05/02/2009)   Mammogram action/deferral: Ordered  (04/30/2009)   Smoking status: current  (10/11/2009)   Smoking cessation counseling: yes  (10/11/2009)  Diabetes Mellitus   HgbA1C: 7.3  (10/11/2009)    Eye exam: Not documented    Foot exam: yes  (06/04/2009)   Foot exam action/deferral: Do today   High risk foot: No  (04/30/2009)   Foot care education: Done  (04/30/2009)    Urine microalbumin/creatinine ratio: 5.5  (06/05/2009)  Lipids   Total Cholesterol: 109  (06/05/2009)   LDL: 56  (06/05/2009)   LDL Direct: Not documented   HDL: 35  (06/05/2009)   Triglycerides: 88  (06/05/2009)    SGOT (AST): 14  (06/05/2009)   SGPT (ALT): 15  (06/05/2009)   Alkaline phosphatase: 97  (06/05/2009)   Total bilirubin: 0.4  (06/05/2009)  Hypertension   Last Blood Pressure: 130 / 70  (10/11/2009)   Serum creatinine: 0.62  (06/05/2009)   Serum potassium 4.3  (06/05/2009)  Self-Management Support :    Patient will work on the following items until the next clinic visit to reach self-care goals:     Medications and monitoring: take my medicines every day, check my blood sugar  (10/11/2009)     Eating: drink diet soda or water instead of juice or soda, eat more vegetables, use fresh or frozen vegetables, eat foods that are low in salt, eat baked foods instead of fried foods, eat fruit for snacks and desserts  (10/11/2009)     Activity: take a 30 minute walk every day  (10/11/2009)    Diabetes self-management support: Education handout  (10/11/2009)   Diabetes education handout printed    Hypertension self-management support: Education handout  (10/11/2009)   Hypertension education handout printed    Lipid self-management support:  Education handout  (10/11/2009)     Lipid education handout printed  Laboratory Results   Blood Tests   Date/Time Received: October 11, 2009 4:06 PM Date/Time Reported: Alric Quan  October 11, 2009 4:06 PM  HGBA1C: 7.3%   (Normal Range: Non-Diabetic - 3-6%   Control Diabetic - 6-8%) CBG Random:: 109mg /dL

## 2010-09-03 NOTE — Progress Notes (Signed)
Summary: med refill/gp  Phone Note Refill Request Message from:  Patient on May 16, 2010 10:37 AM  Pt. requests refill on Naproxen 500mg  which was stopped 03/12/10. Pt. states it helps with the arthritis in her knees.   Method Requested: Telephone to Pharmacy Initial call taken by: Chinita Pester RN,  May 16, 2010 10:37 AM  Follow-up for Phone Call        completed refill, thank you Iskra  Follow-up by: Mliss Sax MD,  May 16, 2010 11:02 AM    New/Updated Medications: NAPROXEN 500 MG TABS (NAPROXEN) Take 1 tablet by mouth two times a day Prescriptions: NAPROXEN 500 MG TABS (NAPROXEN) Take 1 tablet by mouth two times a day  #60 x 3   Entered and Authorized by:   Mliss Sax MD   Signed by:   Mliss Sax MD on 05/16/2010   Method used:   Faxed to ...       [facility] (retail)       [address]       [place], [place]  [address]       Ph: [phone]       Fax: [phone]   RxID:   [id number]

## 2010-09-03 NOTE — Progress Notes (Signed)
Summary: change protonix/ hla  Phone Note Call from Patient   Summary of Call: pt insurance will not pay for protonix, could you please change to something else, pt did not know what they would pay for. Initial call taken by: Marin Roberts RN,  March 20, 2010 9:41 AM  Follow-up for Phone Call        completed refill, thank you Iskra  Follow-up by: Mliss Sax MD,  March 20, 2010 7:34 PM    New/Updated Medications: PRILOSEC 20 MG CPDR (OMEPRAZOLE) take 1 tablet twice daily Prescriptions: PRILOSEC 20 MG CPDR (OMEPRAZOLE) take 1 tablet twice daily  #60 x 5   Entered and Authorized by:   Mliss Sax MD   Signed by:   Mliss Sax MD on 03/20/2010   Method used:   Faxed to ...       [facility] (retail)       [address]       [place], [place]  [address]       Ph: [phone]       Fax: [phone]   RxID:   [id number]

## 2010-09-03 NOTE — Progress Notes (Signed)
Summary: change omeprazole/ hla  Phone Note Call from Patient   Summary of Call: pt's insurance will only pay for 30 omeprazole/ monthly, the prior auth was for 30 a month, ins will not pay for 2 daily, informed pt, she states she does not really need 2 daily, can we change this? if so please change med list, thank you Initial call taken by: Marin Roberts RN,  May 02, 2010 5:34 PM    New/Updated Medications: PRILOSEC 20 MG CPDR (OMEPRAZOLE) take 1 tablet twice daily Prescriptions: PRILOSEC 20 MG CPDR (OMEPRAZOLE) take 1 tablet twice daily  #60 x 6   Entered and Authorized by:   Zoila Shutter MD   Signed by:   Zoila Shutter MD on 05/02/2010   Method used:   Faxed to ...       [facility] (retail)       [address]       [place], [place]  [address]       Ph: [phone]       Fax: [phone]   RxID:   [id number]

## 2010-09-03 NOTE — Progress Notes (Signed)
Summary: prior authorization-Omeprazole/gp  Phone Note Outgoing Call   Summary of Call: [employer] called for prior authorization of Omeprazole 20mg .  Approved x 1 year; from 04/01/10 to 04/02/11.  [facility] was made awared. Initial call taken by: Chinita Pester RN,  April 01, 2010 11:56 AM

## 2010-09-03 NOTE — Progress Notes (Signed)
Summary: med change/gp  Phone Note Refill Request Message from:  Fax from Pharmacy on September 19, 2009 12:31 PM  Refills Requested: Medication #1:  PROTONIX 40 MG TBEC Take 1 tablet by mouth once a day Protonix  is no longer covered by her insurance plan; also the generic is no longer available.  Can you change to something else?   Method Requested: Telephone to Pharmacy Initial call taken by: Chinita Pester RN,  September 19, 2009 12:34 PM  Follow-up for Phone Call        maybe we can try prilosec unless patient has problems with it  thank you  Oday Ridings Follow-up by: Mliss Sax MD,  September 19, 2009 3:09 PM    New/Updated Medications: PRILOSEC 20 MG CPDR (OMEPRAZOLE) Take 1 tablet by mouth two times a day Prescriptions: PRILOSEC 20 MG CPDR (OMEPRAZOLE) Take 1 tablet by mouth two times a day  #60 x 3   Entered and Authorized by:   Mliss Sax MD   Signed by:   Mliss Sax MD on 09/19/2009   Method used:   Faxed to ...       [facility] (retail)       [address]       [place], [place]  [address]       Ph: [phone]       Fax: [phone]   RxID:   [id number]

## 2010-09-03 NOTE — Progress Notes (Signed)
Summary: med refill/gp  Phone Note Refill Request Message from:  Fax from Pharmacy on November 19, 2009 10:20 AM  Refills Requested: Medication #1:  GLIPIZIDE 10 MG TABS Take 2 tablets by mouth two times a day   Last Refilled: 10/19/2009  Medication #2:  SIMVASTATIN 40 MG  TABS Take 1 tablet by mouth once a day   Last Refilled: 10/19/2009  Method Requested: Telephone to Pharmacy Initial call taken by: Chinita Pester RN,  November 19, 2009 10:20 AM  Follow-up for Phone Call        completed refill, thank you Domonik Levario  Follow-up by: Mliss Sax MD,  November 20, 2009 7:14 AM    Prescriptions: SIMVASTATIN 40 MG  TABS (SIMVASTATIN) Take 1 tablet by mouth once a day  #31 x 11   Entered and Authorized by:   Mliss Sax MD   Signed by:   Mliss Sax MD on 11/20/2009   Method used:   Faxed to ...       [facility] (retail)       [address]       [place], [place]  [address]       Ph: [phone]       Fax: [phone]   RxID:   [id number] GLIPIZIDE 10 MG TABS (GLIPIZIDE) Take 2 tablets by mouth two times a day  #120 x 11   Entered and Authorized by:   Mliss Sax MD   Signed by:   Mliss Sax MD on 11/20/2009   Method used:   Faxed to ...       [facility] (retail)       [address]       [place], [place]  [address]       Ph: [phone]       Fax: [phone]   RxID:   [id number]

## 2010-09-04 ENCOUNTER — Ambulatory Visit: Admit: 2010-09-04 | Payer: Self-pay

## 2010-09-04 ENCOUNTER — Ambulatory Visit (INDEPENDENT_AMBULATORY_CARE_PROVIDER_SITE_OTHER): Payer: Medicare Other | Admitting: Internal Medicine

## 2010-09-04 ENCOUNTER — Encounter: Payer: Self-pay | Admitting: Internal Medicine

## 2010-09-04 ENCOUNTER — Other Ambulatory Visit: Payer: Self-pay | Admitting: Internal Medicine

## 2010-09-04 DIAGNOSIS — E785 Hyperlipidemia, unspecified: Secondary | ICD-10-CM

## 2010-09-04 DIAGNOSIS — I10 Essential (primary) hypertension: Secondary | ICD-10-CM

## 2010-09-04 DIAGNOSIS — R52 Pain, unspecified: Secondary | ICD-10-CM

## 2010-09-04 DIAGNOSIS — E119 Type 2 diabetes mellitus without complications: Secondary | ICD-10-CM

## 2010-09-04 LAB — GLUCOSE, CAPILLARY: Glucose-Capillary: 126 mg/dL — ABNORMAL HIGH (ref 70–99)

## 2010-09-04 NOTE — Patient Instructions (Signed)
Please schedule an appointment in 3 months. Diabetes and Foot Care   Diabetes may cause you to have a poor blood supply (circulation) to your legs and feet. Because of this, the skin may be thinner, break easier, and heal slower. You also may have nerve damage in your legs and feet causing decreased feeling. You may not notice minor injuries to your feet that could lead to serious problems or infections. Taking care of your feet is one of the most important things you can do for yourself.     HOME CARE INSTRUCTIONS FOR FOOT CARE  Do not go barefoot. Bare feet are easily injured.  Check your feet daily for blisters, cuts, and redness.  Wash your feet with warm water (not hot) and mild soap. Pat your feet and between your toes until completely dry.  Apply a moisturizing lotion that does not contain alcohol or petroleum jelly to the dry skin on your feet and to dry brittle toenails. Do not put it between your toes.  Trim your toenails straight across. Do not dig under them or around the cuticle.  Do not cut corns or calluses, or try to remove them with medicine.  Wear clean cotton socks or stockings every day. Make sure they are not too tight. Do not wear knee high stockings since they may decrease blood flow to your legs.  Wear leather shoes that fit properly and have enough cushioning. To break in new shoes, wear them just a few hours a day to avoid injuring your feet.  Wear shoes at all times, even in the house.  Do not cross your legs. This may decrease the blood flow to your feet.   If you find a minor scrape, cut, or break in the skin on your feet, keep it and the skin around it clean and dry. These areas may be cleansed with mild soap and water. Do not use peroxide, alcohol, iodine or Merthiolate.  When you remove an adhesive bandage, be sure not to harm the skin around it.  If you have a wound, look at it several times a day to make sure it is healing.  Do not use heating pads  or hot water bottles. Burns can occur. If you have lost feeling in your feet or legs, you may not know it is happening until it is too late.  Report any cuts, sores or bruises to your caregiver. Do not wait!     SEEK MEDICAL CARE IF:  You have an injury that is not healing or you notice redness, numbness, burning, or tingling.  Your feet always feel cold.  You have pain or cramps in your legs and feet.   SEEK IMMEDIATE MEDICAL CARE IF:  There is increasing redness, swelling, or increasing pain in the wound.  There is a red line that goes up your leg.  Pus is coming from a wound.  You develop an unexplained oral temperature above 102 F (38.9 C), or as your caregiver suggests.  You notice a bad smell coming from an ulcer or wound.    MAKE SURE YOU:   Understand these instructions.   Will watch your condition.  Will get help right away if you are not doing well or get worse.   Document Released: 07/18/2000  Document Re-Released: 05/18/2009 [facility] Patient Information 2011 [place], [place].

## 2010-09-05 NOTE — Assessment & Plan Note (Signed)
Continue the same pain medication regimen since well controlled on it and continue PT.

## 2010-09-05 NOTE — Assessment & Plan Note (Signed)
Pt's last FLP was checked 1 year ago and her LDL was at the target level. We have agreed that we will check FLP on her next visit and will readjust the regimen as indicated.

## 2010-09-05 NOTE — Progress Notes (Signed)
  Subjective:    Patient ID: Julie Elliott, female    DOB: [birth year], 55 y.o.   MRN: [id number]  HPI 55 yo female with PMH outlined below presents to [facility] for regular follow up. She reports feeling well, no major concerns for today. She continues to experience pain in her lower back area radiating down to her left leg. This is chronic pain for her and current pain medication regimen is helpful. She is trying to exercise daily and is resting as needed. She reports being compliant with her blood pressure medications as well as diabetes medications and is adherent to recommended diet. She denies recent sicknesses, hospitalizations,no episodes of chest pain, cough, fever, chills, abdominal, or urinary concerns.    Review of Systems  Constitutional: Negative.   HENT: Negative.   Respiratory: Negative.   Cardiovascular: Negative.   Genitourinary: Negative.        Objective:   Physical Exam  Constitutional: She appears well-developed and well-nourished. No distress.  Cardiovascular: Normal rate, regular rhythm, normal heart sounds and intact distal pulses.  Exam reveals no gallop and no friction rub.   No murmur heard. Pulmonary/Chest: Effort normal and breath sounds normal. No respiratory distress. She has no wheezes. She has no rales. She exhibits no tenderness.  Abdominal: Soft. Bowel sounds are normal. She exhibits no distension and no mass. There is no tenderness. There is no rebound and no guarding.  Musculoskeletal:       Lumbar back: She exhibits decreased range of motion, tenderness and spasm. She exhibits no swelling, no edema and no deformity.       Left upper leg: She exhibits tenderness and bony tenderness. She exhibits no swelling, no edema, no deformity and no laceration.       Left lower leg: She exhibits tenderness and bony tenderness. She exhibits no swelling, no edema and no deformity.       Left foot: She exhibits normal range of motion, no tenderness, no bony tenderness, no  swelling, normal capillary refill, no crepitus, no deformity and no laceration.  Skin: She is not diaphoretic.          Assessment & Plan:

## 2010-09-05 NOTE — Assessment & Plan Note (Addendum)
BP is at goal and will continue the same regimen. Will need bmet on her next visit.

## 2010-09-05 NOTE — Assessment & Plan Note (Signed)
HgBA1C checked today and is at the target level, will continue the same regimen. I have examined her feet today and the physical exam finding are WNL except thickened right foot big toe. We have discussed the proper foot care and careful examination of both feet daily. She compliant with her medications and I have encouraged her to continue with same routine.

## 2010-09-27 ENCOUNTER — Encounter: Payer: Self-pay | Admitting: Internal Medicine

## 2010-10-29 ENCOUNTER — Other Ambulatory Visit (INDEPENDENT_AMBULATORY_CARE_PROVIDER_SITE_OTHER): Payer: Medicare Other | Admitting: *Deleted

## 2010-10-29 DIAGNOSIS — K219 Gastro-esophageal reflux disease without esophagitis: Secondary | ICD-10-CM

## 2010-10-29 MED ORDER — OMEPRAZOLE 20 MG PO CPDR: 20.0000 mg | DELAYED_RELEASE_CAPSULE | Freq: Two times a day (BID) | ORAL | Status: DC

## 2010-11-08 LAB — GLUCOSE, CAPILLARY: Glucose-Capillary: 112 mg/dL — ABNORMAL HIGH (ref 70–99)

## 2010-11-14 LAB — GLUCOSE, CAPILLARY: Glucose-Capillary: 112 mg/dL — ABNORMAL HIGH (ref 70–99)

## 2010-12-05 NOTE — Procedures (Signed)
[facility]                            [facility]                               [address]                          [place], [place] [address]                           NUCLEAR CARDIOLOGY REPORT    PATIENT:    [name], [name] Short  MRN:        [id number]  BILLING:    [id number]  DATE:       12/05/2010  LOCATION:  REFERRINGCecile Hearing., MD  DICTATING:  Jerlyn Ly, MD      PHARMACOLOGIC NUCLEAR STRESS GATED SPECT IMAGING    ORDERING PHYSICIAN: Jules Husbands, MD    INDICATIONS: Abnormal EKG. Multiple cardiac risk factors including  diabetes, hypertension, and dyslipidemia.     RESTING ELECTROCARDIOGRAM: Normal sinus rhythm, poor R-wave progression. No  ST or T-wave abnormalities.    PHARMACOLOGICAL STRESS PORTION: The patient underwent Lexiscan infusion at  0.4 mg and then did a low level walk. The patient remained in sinus rhythm  without significant ST-T abnormalities.    PERFUSION IMAGING: The patient received intravenous technetium 67m Myoview  9.2 mCi for the resting images and 30.8 mCi for the stress images. Multiple  tomographic views of the left ventricle were obtained and compared in the  short axis, vertical, and horizontal long axis views. Left ventricular  cavity was normal in size with both the stress and rest imaging. There was  no transient ischemic dilatation present. There was a mild, but  predominantly fixed anterior perfusion imaging abnormality that did not  involve the apex or the septum, suspicious for breast attenuation artifact.  There were no large reversible perfusion imaging abnormalities to suggest  myocardial ischemia. On gated analysis, left ventricular end-diastolic  volume was calculated to be normal. Overall systolic function was normal  with no regional wall motion abnormalities present. Ejection fraction  calculated at 68%.    CONCLUSIONS  1. Probable normal pharmacological nuclear stress test.  2. Low risk  study.  3. Mild mid anterior perfusion defect, most likely secondary to breast  attenuation artifact.    4. No large reversible perfusion imaging abnormalities to suggest  myocardial ischemia.  5. Normal left ventricular size and systolic function, ejection fraction  calculated at 68%.                    STAGE    MINUTES      MPH       GRADE       METS        HR      BP      I        1:00       ---        ---        ---        127     144/8  4     II        1:00       ---        ---        ---        117     138/8                                                                     0     III       1:00       ---        ---        ---     IV        1:00       ---        ---        ---                       Electronically Signed       Jerlyn Ly, MD 12/10/2010 15:59                                       Jerlyn Ly, MD    MJR:WMX:ccm  D: 12/05/2010  CScript:12/05/2010  1:49 P  CQDocID #: [id number]   CScriptDoc #:  [id number]   cc:  Cecile Hearing., MD        Jerlyn Ly, MD

## 2010-12-05 NOTE — Procedures (Addendum)
[facility]                            [facility]                               [address]                          [place], [place] [address]                           NUCLEAR CARDIOLOGY REPORT    PATIENT:    [name] Short, [name] Short  MRN:        [id number]  BILLING:    [id number]  DATE:       12/05/2010  LOCATION:  REFERRINGCecile Hearing., MD  DICTATING:  Jerlyn Ly, MD      PHARMACOLOGIC NUCLEAR STRESS GATED SPECT IMAGING    ORDERING PHYSICIAN: Jules Husbands, MD    INDICATIONS: Abnormal EKG. Multiple cardiac risk factors including  diabetes, hypertension, and dyslipidemia.     RESTING ELECTROCARDIOGRAM: Normal sinus rhythm, poor R-wave progression. No  ST or T-wave abnormalities.    PHARMACOLOGICAL STRESS PORTION: The patient underwent Lexiscan infusion at  0.4 mg and then did a low level walk. The patient remained in sinus rhythm  without significant ST-T abnormalities.    PERFUSION IMAGING: The patient received intravenous technetium 45m Myoview  9.2 mCi for the resting images and 30.8 mCi for the stress images. Multiple  tomographic views of the left ventricle were obtained and compared in the  short axis, vertical, and horizontal long axis views. Left ventricular  cavity was normal in size with both the stress and rest imaging. There was  no transient ischemic dilatation present. There was a mild, but  predominantly fixed anterior perfusion imaging abnormality that did not  involve the apex or the septum, suspicious for breast attenuation artifact.  There were no large reversible perfusion imaging abnormalities to suggest  myocardial ischemia. On gated analysis, left ventricular end-diastolic  volume was calculated to be normal. Overall systolic function was normal  with no regional wall motion abnormalities present. Ejection fraction  calculated at 68%.    CONCLUSIONS  1. Probable normal pharmacological nuclear stress test.   2. Low risk study.  3. Mild mid anterior perfusion defect, most likely secondary to breast  attenuation artifact.    4. No large reversible perfusion imaging abnormalities to suggest  myocardial ischemia.  5. Normal left ventricular size and systolic function, ejection fraction  calculated at 68%.                    STAGE    MINUTES      MPH       GRADE       METS        HR      BP      I        1:00       ---        ---        ---        127     144/8  4     II        1:00       ---        ---        ---        117     138/8                                                                     0     III       1:00       ---        ---        ---     IV        1:00       ---        ---        ---                       Electronically Signed       Jerlyn Ly, MD 12/10/2010 15:59                                       Jerlyn Ly, MD    MJR:WMX:ccm  D: 12/05/2010  CScript:12/05/2010  1:49 P  CQDocID #: [id number]   CScriptDoc #:  [id number]   cc:  Cecile Hearing., MD        Jerlyn Ly, MD

## 2011-01-14 ENCOUNTER — Other Ambulatory Visit (INDEPENDENT_AMBULATORY_CARE_PROVIDER_SITE_OTHER): Payer: Medicare Other | Admitting: *Deleted

## 2011-01-14 DIAGNOSIS — R52 Pain, unspecified: Secondary | ICD-10-CM

## 2011-01-14 MED ORDER — NAPROXEN 500 MG PO TABS: 500.0000 mg | ORAL_TABLET | Freq: Two times a day (BID) | ORAL | Status: DC

## 2011-02-28 ENCOUNTER — Other Ambulatory Visit: Payer: Self-pay | Admitting: Internal Medicine

## 2011-03-05 ENCOUNTER — Encounter: Payer: Medicare Other | Admitting: Internal Medicine

## 2011-03-12 ENCOUNTER — Encounter: Payer: Self-pay | Admitting: Internal Medicine

## 2011-03-12 ENCOUNTER — Ambulatory Visit (INDEPENDENT_AMBULATORY_CARE_PROVIDER_SITE_OTHER): Payer: Medicare Other | Admitting: Internal Medicine

## 2011-03-12 VITALS — BP 125/89 | HR 112 | Temp 99.3°F | Ht 63.5 in | Wt 282.5 lb

## 2011-03-12 DIAGNOSIS — E119 Type 2 diabetes mellitus without complications: Secondary | ICD-10-CM

## 2011-03-12 DIAGNOSIS — I10 Essential (primary) hypertension: Secondary | ICD-10-CM

## 2011-03-12 DIAGNOSIS — R52 Pain, unspecified: Secondary | ICD-10-CM

## 2011-03-12 DIAGNOSIS — M1990 Unspecified osteoarthritis, unspecified site: Secondary | ICD-10-CM

## 2011-03-12 DIAGNOSIS — E785 Hyperlipidemia, unspecified: Secondary | ICD-10-CM

## 2011-03-12 LAB — COMPREHENSIVE METABOLIC PANEL
ALT: 14 U/L (ref 0–35)
AST: 13 U/L (ref 0–37)
Alkaline Phosphatase: 106 U/L (ref 39–117)
Sodium: 140 mEq/L (ref 135–145)
Total Bilirubin: 0.5 mg/dL (ref 0.3–1.2)
Total Protein: 7.7 g/dL (ref 6.0–8.3)

## 2011-03-12 LAB — LIPID PANEL
Cholesterol: 133 mg/dL (ref 0–200)
HDL: 41 mg/dL (ref >39)
LDL Cholesterol: 72 mg/dL (ref 0–99)
Triglycerides: 99 mg/dL (ref <150)

## 2011-03-12 LAB — GLUCOSE, CAPILLARY: Glucose-Capillary: 155 mg/dL — ABNORMAL HIGH (ref 70–99)

## 2011-03-12 MED ORDER — ENALAPRIL MALEATE 20 MG PO TABS: 20.0000 mg | ORAL_TABLET | Freq: Two times a day (BID) | ORAL | Status: DC

## 2011-03-12 MED ORDER — OXYCODONE-ACETAMINOPHEN 5-325 MG PO TABS: 1.0000 | ORAL_TABLET | Freq: Three times a day (TID) | ORAL | Status: DC | PRN

## 2011-03-12 MED ORDER — SIMVASTATIN 40 MG PO TABS: 40.0000 mg | ORAL_TABLET | Freq: Every day | ORAL | Status: DC

## 2011-03-12 MED ORDER — CALCIUM-VITAMIN D 600-125 MG-UNIT PO TABS: 1.0000 | ORAL_TABLET | Freq: Every day | ORAL | Status: DC

## 2011-03-12 MED ORDER — OMEPRAZOLE 20 MG PO CPDR: 20.0000 mg | DELAYED_RELEASE_CAPSULE | Freq: Two times a day (BID) | ORAL | Status: DC

## 2011-03-12 MED ORDER — DIGOXIN 125 MCG PO TABS: 125.0000 ug | ORAL_TABLET | Freq: Every day | ORAL | Status: DC

## 2011-03-12 MED ORDER — FUROSEMIDE 80 MG PO TABS: 80.0000 mg | ORAL_TABLET | Freq: Every day | ORAL | Status: DC

## 2011-03-12 MED ORDER — ASPIRIN 81 MG PO TBEC: 81.0000 mg | DELAYED_RELEASE_TABLET | Freq: Every day | ORAL | Status: DC

## 2011-03-12 MED ORDER — METHOCARBAMOL 500 MG PO TABS: 500.0000 mg | ORAL_TABLET | Freq: Two times a day (BID) | ORAL | Status: DC | PRN

## 2011-03-12 MED ORDER — NAPROXEN 500 MG PO TABS: 500.0000 mg | ORAL_TABLET | Freq: Two times a day (BID) | ORAL | Status: DC

## 2011-03-12 MED ORDER — INSULIN GLARGINE 100 UNIT/ML ~~LOC~~ SOLN: 30.0000 [IU] | Freq: Two times a day (BID) | SUBCUTANEOUS | Status: DC

## 2011-03-12 MED ORDER — GLIPIZIDE 10 MG PO TABS: 20.0000 mg | ORAL_TABLET | Freq: Two times a day (BID) | ORAL | Status: DC

## 2011-03-12 MED ORDER — "PEN NEEDLES 5/16"" 31G X 8 MM MISC": 1.0000 | Freq: Three times a day (TID) | Status: DC

## 2011-03-12 MED ORDER — POTASSIUM CHLORIDE CRYS ER 20 MEQ PO TBCR: 20.0000 meq | EXTENDED_RELEASE_TABLET | Freq: Every day | ORAL | Status: DC

## 2011-03-12 MED ORDER — METOPROLOL SUCCINATE ER 50 MG PO TB24: 50.0000 mg | ORAL_TABLET | Freq: Every day | ORAL | Status: DC

## 2011-03-12 MED ORDER — GLUCOSE BLOOD VI STRP: ORAL_STRIP | Status: DC

## 2011-03-12 NOTE — Assessment & Plan Note (Signed)
We will check fasting lipid panel today as well as liver function tests. We will readjust the medication regimen if indicated.

## 2011-03-12 NOTE — Progress Notes (Signed)
  Subjective:    Patient ID: Julie Elliott, female    DOB: [birth year], 55 y.o.   MRN: [id number]  HPI Patient is 55 year old female with past medical history outlined below who presents to clinic for regular followup for diabetes, hypertension, cholesterol. In addition she wants to get a referral to sports medicine doctor.She has chronic pain in her neck, back, and knees which has been there for approximately 7 years ever since she had an accident when she fell down. She was told she has degenerative joint disease and has been treated with numerous medications including NSAIDs and narcotics with minimum relief. She is interested in receiving injections other than steroids. She is also interested in physical therapy. She denies recent sicknesses or hospitalizations, no episodes of chest pain, no abdominal or urinary concerns. She reports compliance With current medications and recommended diet.   Review of Systems Per HPI    Objective:   Physical Exam Constitutional: Vital signs reviewed.  Patient is a well-developed and well-nourished in no acute distress and cooperative with exam. Alert and oriented x3.  Neck: Supple, Trachea midline normal ROM, No JVD, mass, thyromegaly, or carotid bruit present.  Cardiovascular: RRR, S1 normal, S2 normal, no MRG, pulses symmetric and intact bilaterally Pulmonary/Chest: CTAB, no wheezes, rales, or rhonchi Abdominal: Soft. Non-tender, non-distended, bowel sounds are normal, no masses, organomegaly, or guarding present.  Neurological: A&O x3, Strenght is normal and symmetric bilaterally, cranial nerve II-XII are grossly intact, no focal motor deficit, sensory intact to light touch bilaterally.  Skin: Warm, dry and intact. No rash, cyanosis, or clubbing.  Psychiatric: Normal mood and affect. speech and behavior is normal. Judgment and thought content normal. Cognition and memory are normal.          Assessment & Plan:

## 2011-03-12 NOTE — Assessment & Plan Note (Signed)
Diabetes is well controlled. Patient is compliant with current medications and recommended diet. I have encouraged her to continue doing the same. I have examined her feet today and the results are physical exam are within normal limits.

## 2011-03-12 NOTE — Assessment & Plan Note (Signed)
Well controlled on current medication regimen. We will check electrolyte panel today. No changes to her medication regimen are indicated today.

## 2011-03-12 NOTE — Assessment & Plan Note (Signed)
In multiple areas throughout the body secondary to severe degenerative joint disease. We will make a referral to sports medicine per patient's request.

## 2011-03-17 MED ORDER — ENALAPRIL MALEATE 20 MG PO TABS: 20.0000 mg | ORAL_TABLET | Freq: Two times a day (BID) | ORAL | Status: DC

## 2011-03-17 NOTE — Progress Notes (Signed)
Addended byMliss Sax on: 03/17/2011 10:11 AM   Modules accepted: Orders

## 2011-03-19 ENCOUNTER — Ambulatory Visit (INDEPENDENT_AMBULATORY_CARE_PROVIDER_SITE_OTHER): Payer: Medicare Other | Admitting: Family Medicine

## 2011-03-19 VITALS — BP 117/84

## 2011-03-19 DIAGNOSIS — M545 Low back pain, unspecified: Secondary | ICD-10-CM

## 2011-03-19 DIAGNOSIS — R52 Pain, unspecified: Secondary | ICD-10-CM

## 2011-04-01 NOTE — Progress Notes (Signed)
  Subjective:    Patient ID: Julie Elliott, female    DOB: 02-02-1956, 55 y.o.   MRN: [id number]  HPI 55 y/o female with chronic pain syndrome since falling in 2007.  This visit is regarding the back pain specifically.  It is low back pain the sometimes radiates to the right leg.  No red flag symptoms.  She says that she has numbness in all 4 extremities at times. She also has some hip pain that radiates into the groin. The pain is worse with walking or prolonged sitting.  She has been evaluated by a chiropractor, a rheumatologist, a neurologist, and an orthopedic surgeon and has found no answer to her problems. She has had multiple imaging studies to include plain films, MRI, and full body bone scan. She did start taking white willow bark per the Dr. Neil Crouch show about a week ago and notes a significant improvement in her symptoms.   Review of Systems     Objective:   Physical Exam  Back:  No masses Tender to deep palpation paraspinal Flexes, extends, and rotates with minimal pain Strength is normal DTR's 1+ bilat FABER is painful on the right         Assessment & Plan:

## 2011-04-01 NOTE — Assessment & Plan Note (Signed)
Regarding her back pain physical therapy would be helpful.  She is currently happy with her white willow bark.  We did discuss adding physical therapy.  She will make that decision at her next visit.    Her 4 extremity tingling sounds more like a systemic issue vs a musculoskeletal problem.  Would consider side effects of medications or complications of her diabetes.  She will discuss this with her PCM.  The hip pain could be deferred pain from her back.  A plain film is ordered to evaluate the hip joint specifically to be sure.

## 2011-05-09 LAB — GLUCOSE, CAPILLARY: Glucose-Capillary: 159 mg/dL — ABNORMAL HIGH (ref 70–99)

## 2011-08-19 ENCOUNTER — Encounter: Payer: Self-pay | Admitting: Internal Medicine

## 2011-08-19 ENCOUNTER — Ambulatory Visit (INDEPENDENT_AMBULATORY_CARE_PROVIDER_SITE_OTHER): Payer: Medicare Other | Admitting: Internal Medicine

## 2011-08-19 VITALS — BP 126/77 | HR 100 | Temp 97.7°F | Ht 63.5 in | Wt 274.4 lb

## 2011-08-19 DIAGNOSIS — M1990 Unspecified osteoarthritis, unspecified site: Secondary | ICD-10-CM | POA: Diagnosis not present

## 2011-08-19 DIAGNOSIS — E119 Type 2 diabetes mellitus without complications: Secondary | ICD-10-CM | POA: Diagnosis not present

## 2011-08-19 LAB — POCT GLYCOSYLATED HEMOGLOBIN (HGB A1C): Hemoglobin A1C: 6.5

## 2011-08-19 MED ORDER — TRAMADOL HCL 50 MG PO TABS: 50.0000 mg | ORAL_TABLET | Freq: Four times a day (QID) | ORAL | Status: DC | PRN

## 2011-08-19 NOTE — Progress Notes (Signed)
Subjective:   Patient ID: Julie Elliott female   DOB: 12/30/1955 56 y.o.   MRN: [id number]  HPI: [name] is a 56 y.o.   56 yo female with PMH outlined below presents for regular follow up. She reports feeling well, no major concerns for today. She continues to experience pain in her right hip and lower back area radiating down to her right leg. She has had multiple imaging studies including plain films, MRI, and full body bone scan in the past.  No red flag symptoms, such as lose control of bladder and weakness. The pain is worse with walking or prolonged sitting.    Denies fever, chills, fatigue, headaches,  cough, chest pain, SOB,  abdominal pain,diarrhea, constipation, dysuria, urgency, frequency, hematuria or leg swelling.   Past Medical History  Diagnosis Date  . Hypertension     well controlled  . DJD (degenerative joint disease)   . HLD (hyperlipidemia) 2008    stable, well controlled  . DM (diabetes mellitus) 2008    stable HgBA1C at 6.5  . GERD (gastroesophageal reflux disease)     well controlled on Omeprazole  . Chronic headaches    Current Outpatient Prescriptions  Medication Sig Dispense Refill  . aspirin 81 MG EC tablet Take 1 tablet (81 mg total) by mouth daily.  31 tablet  11  . B-D ULTRA-FINE 33 LANCETS MISC Use to test blood sugar up to 3 times a day.       . Calcium-Vitamin D 600-125 MG-UNIT TABS Take 1 tablet by mouth daily.  30 each  11  . digoxin (LANOXIN) 0.125 MG tablet Take 1 tablet (125 mcg total) by mouth daily.  31 tablet  11  . enalapril (VASOTEC) 20 MG tablet Take 1 tablet (20 mg total) by mouth 2 (two) times daily.  62 tablet  11  . Flaxseed, Linseed, (FLAX SEED OIL) 1000 MG CAPS Take by mouth.        . furosemide (LASIX) 80 MG tablet Take 1 tablet (80 mg total) by mouth daily.  31 tablet  11  . glipiZIDE (GLUCOTROL) 10 MG tablet Take 2 tablets (20 mg total) by mouth 2 (two) times daily.  62 tablet  11  . glucose blood (BAYER CONTOUR TEST) test  strip [employer]. Use to test blood sugar 3 times a day.  100 each  11  . insulin glargine (LANTUS SOLOSTAR) 100 UNIT/ML injection Inject 30 Units into the skin 2 (two) times daily.  10 mL  11  . Insulin Pen Needle (PEN NEEDLES 31GX5/16") 31G X 8 MM MISC Inject 1 Container into the skin 3 (three) times daily. Use to inject insulin twice daily.  1 each  11  . metoprolol (TOPROL-XL) 50 MG 24 hr tablet Take 1 tablet (50 mg total) by mouth daily.  31 tablet  11  . naproxen (NAPROSYN) 500 MG tablet Take 1 tablet (500 mg total) by mouth 2 (two) times daily.  180 tablet  11  . omeprazole (PRILOSEC) 20 MG capsule Take 1 capsule (20 mg total) by mouth 2 (two) times daily.  62 capsule  11  . potassium chloride SA (K-DUR,KLOR-CON) 20 MEQ tablet Take 1 tablet (20 mEq total) by mouth daily.  31 tablet  11  . simvastatin (ZOCOR) 40 MG tablet Take 1 tablet (40 mg total) by mouth daily.  31 tablet  11  . traMADol (ULTRAM) 50 MG tablet Take 1 tablet (50 mg total) by mouth every 6 (six) hours as needed for  pain.  20 tablet  0  . methocarbamol (ROBAXIN) 500 MG tablet Take 1 tablet (500 mg total) by mouth 2 (two) times daily as needed. For muscle spasm.  62 tablet  11  . Misc Natural Products (WHITE WILLOW BARK PO) Take by mouth.        . oxyCODONE-acetaminophen (PERCOCET) 5-325 MG per tablet Take 1 tablet by mouth every 8 (eight) hours as needed. For pain.  90 tablet  0  . polyethylene glycol (MIRALAX) powder Use as instructed on the package.        Family History  Problem Relation Age of Onset  . Diabetes Mother   . Heart disease Mother   . Prostate cancer Father    History   Social History  . Marital Status: Single    Spouse Name: N/A    Number of Children: N/A  . Years of Education: N/A   Social History Main Topics  . Smoking status: Current Everyday Smoker -- 0.4 packs/day  . Smokeless tobacco: None   Comment: Smokes 5-6 cigerettes per day  . Alcohol Use: None  . Drug Use: None  . Sexually Active:  None   Other Topics Concern  . None   Social History Narrative   to get supplies nfrom [facility] [employer] per pt request.[phone]   Review of Systems: as per HPI  Objective:  Physical Exam: Filed Vitals:   08/19/11 1348  BP: 126/77  Pulse: 100  Temp: 97.7 F (36.5 C)  TempSrc: Oral  Height: 5' 3.5" (1.613 m)  Weight: 274 lb 6.4 oz (124.467 kg)  SpO2: 99%    General: resting in bed, not in acute distress HEENT: PERRL, EOMI, no scleral icterus Cardiac: S1/S2, RRR, No murmurs, gallops or rubs Pulm: Good air movement bilaterally, Clear to auscultation bilaterally, No rales, wheezing, rhonchi or rubs. Abd: Soft,  nondistended, nontender, no rebound pain, no organomegaly, BS present Ext: No rashes or edema, 2+DP/PT pulse bilaterally. Neuro: alert and oriented X3, cranial nerves II-XII grossly intact, muscle strength 5/5 in all extremeties,  sensation to light touch intact.   Assessment & Plan:   # Chronic back pain and Hip pain:  She has had multiple imaging studies including plain films, MRI, and full body bone scan in the past.  No red flag symptoms, such as lost control of bladder and weakness. Her pain is mild if she is not sitting or standing for long time. Will continue Naproxen and add tramadol prn. Stop percocet. And follow up.  # DIABETES MELLITUS, TYPE II : well controlled. A1c is 6.5 today. Will continue the current regimen and follow up.  #  HYPERLIPIDEMIA - well controlled. Her LDL was 72 and AST/ALT were normal on 03/12/11.  Will continue with current regimen and follow up.   # HYPERTENSION -  BP is at goal and will continue the same regimen.   Lorretta Harp

## 2011-08-19 NOTE — Assessment & Plan Note (Signed)
A 

## 2011-08-19 NOTE — Patient Instructions (Signed)
1. Please take all medications as prescribed.  2. If you have worsening of your symptoms or new symptoms arise, please call the clinic ([phone]), or go to the ER immediately if symptoms are severe. 3.  

## 2011-08-20 NOTE — Progress Notes (Signed)
I agree with Dr. Niu's assessment and plan. 

## 2011-12-16 ENCOUNTER — Ambulatory Visit (INDEPENDENT_AMBULATORY_CARE_PROVIDER_SITE_OTHER): Payer: Medicare Other | Admitting: Internal Medicine

## 2011-12-16 ENCOUNTER — Encounter: Payer: Self-pay | Admitting: Internal Medicine

## 2011-12-16 VITALS — Temp 98.2°F | Ht 63.0 in | Wt 273.9 lb

## 2011-12-16 DIAGNOSIS — E119 Type 2 diabetes mellitus without complications: Secondary | ICD-10-CM

## 2011-12-16 DIAGNOSIS — I10 Essential (primary) hypertension: Secondary | ICD-10-CM

## 2011-12-16 DIAGNOSIS — K219 Gastro-esophageal reflux disease without esophagitis: Secondary | ICD-10-CM | POA: Diagnosis not present

## 2011-12-16 DIAGNOSIS — M1990 Unspecified osteoarthritis, unspecified site: Secondary | ICD-10-CM | POA: Diagnosis not present

## 2011-12-16 DIAGNOSIS — I509 Heart failure, unspecified: Secondary | ICD-10-CM

## 2011-12-16 DIAGNOSIS — E785 Hyperlipidemia, unspecified: Secondary | ICD-10-CM | POA: Diagnosis not present

## 2011-12-16 LAB — GLUCOSE, CAPILLARY: Glucose-Capillary: 165 mg/dL — ABNORMAL HIGH (ref 70–99)

## 2011-12-16 LAB — POCT GLYCOSYLATED HEMOGLOBIN (HGB A1C): Hemoglobin A1C: 6.8

## 2011-12-16 MED ORDER — ENALAPRIL MALEATE 20 MG PO TABS: 20.0000 mg | ORAL_TABLET | Freq: Two times a day (BID) | ORAL | Status: DC

## 2011-12-16 MED ORDER — POTASSIUM CHLORIDE CRYS ER 20 MEQ PO TBCR: 20.0000 meq | EXTENDED_RELEASE_TABLET | Freq: Every day | ORAL | Status: DC

## 2011-12-16 MED ORDER — TRAMADOL HCL 50 MG PO TABS: 50.0000 mg | ORAL_TABLET | Freq: Four times a day (QID) | ORAL | Status: DC | PRN

## 2011-12-16 MED ORDER — NAPROXEN 500 MG PO TABS: 500.0000 mg | ORAL_TABLET | Freq: Two times a day (BID) | ORAL | Status: DC

## 2011-12-16 MED ORDER — FUROSEMIDE 80 MG PO TABS: 80.0000 mg | ORAL_TABLET | Freq: Every day | ORAL | Status: DC

## 2011-12-16 MED ORDER — DIGOXIN 125 MCG PO TABS: 125.0000 ug | ORAL_TABLET | Freq: Every day | ORAL | Status: DC

## 2011-12-16 MED ORDER — SIMVASTATIN 40 MG PO TABS: 40.0000 mg | ORAL_TABLET | Freq: Every day | ORAL | Status: DC

## 2011-12-16 MED ORDER — GLIPIZIDE 10 MG PO TABS: 20.0000 mg | ORAL_TABLET | Freq: Two times a day (BID) | ORAL | Status: DC

## 2011-12-16 MED ORDER — CALCIUM-VITAMIN D 600-125 MG-UNIT PO TABS: 1.0000 | ORAL_TABLET | Freq: Every day | ORAL | Status: DC

## 2011-12-16 MED ORDER — METOPROLOL SUCCINATE ER 50 MG PO TB24: 50.0000 mg | ORAL_TABLET | Freq: Every day | ORAL | Status: DC

## 2011-12-16 MED ORDER — ASPIRIN 81 MG PO TBEC: 81.0000 mg | DELAYED_RELEASE_TABLET | Freq: Every day | ORAL | Status: DC

## 2011-12-16 MED ORDER — INSULIN GLARGINE 100 UNIT/ML ~~LOC~~ SOLN: 30.0000 [IU] | Freq: Two times a day (BID) | SUBCUTANEOUS | Status: DC

## 2011-12-16 MED ORDER — OMEPRAZOLE 20 MG PO CPDR: 20.0000 mg | DELAYED_RELEASE_CAPSULE | Freq: Two times a day (BID) | ORAL | Status: DC

## 2011-12-16 NOTE — Patient Instructions (Signed)
1. You have done great job in taking your medications. I appreciate it very much. Please continue to do it. 2. Please take all medications as prescribed.  3. If you have worsening of your symptoms or new symptoms arise, please call the clinic [phone]), or go to the ER immediately if symptoms are severe

## 2011-12-16 NOTE — Progress Notes (Signed)
Patient ID: Julie Elliott, female   DOB: 11-Dec-1955, 56 y.o.   MRN: [id number]   Subjective:   Patient ID: Julie Elliott female   DOB: 09/23/1955 56 y.o.   MRN: [id number]  HPI: [name] is a 56 y.o.  Julie Harp, MD  08/19/2011  7:45 PM  Signed    Subjective:    Patient ID: Julie Elliott female   DOB: Sep 28, 1955 56 y.o.   MRN: [id number]  HPI: [name] is a 56 y.o. female with PMH outlined below presents for regular follow up. She reports feeling well, no major concerns for today. She took all her medication bottles with her and reports that she is taking all her medications regularly. She wants to have all of them be refilled today. Her pain in right hip and lower back improved, which she attribute to Tramadol. No red flag symptoms, such as lose control of bladder and weakness.   Denies fever, chills, fatigue, headaches,  cough, chest pain, SOB,  abdominal pain,diarrhea, constipation, dysuria, urgency, frequency, hematuria or leg swelling.     Past Medical History   Diagnosis  Date   .  Hypertension         well controlled   .  DJD (degenerative joint disease)     .  HLD (hyperlipidemia)  2008       stable, well controlled   .  DM (diabetes mellitus)  2008       stable HgBA1C at 6.5   .  GERD (gastroesophageal reflux disease)         well controlled on Omeprazole   .  Chronic headaches      Current Outpatient Prescriptions   Medication  Sig  Dispense  Refill   .  aspirin 81 MG EC tablet  Take 1 tablet (81 mg total) by mouth daily.   31 tablet   11   .  B-D ULTRA-FINE 33 LANCETS MISC  Use to test blood sugar up to 3 times a day.          .  Calcium-Vitamin D 600-125 MG-UNIT TABS  Take 1 tablet by mouth daily.   30 each   11   .  digoxin (LANOXIN) 0.125 MG tablet  Take 1 tablet (125 mcg total) by mouth daily.   31 tablet   11   .  enalapril (VASOTEC) 20 MG tablet  Take 1 tablet (20 mg total) by mouth 2 (two) times daily.   62 tablet   11   .  Flaxseed, Linseed, (FLAX SEED OIL) 1000  MG CAPS  Take by mouth.           .  furosemide (LASIX) 80 MG tablet  Take 1 tablet (80 mg total) by mouth daily.   31 tablet   11   .  glipiZIDE (GLUCOTROL) 10 MG tablet  Take 2 tablets (20 mg total) by mouth 2 (two) times daily.   62 tablet   11   .  glucose blood (BAYER CONTOUR TEST) test strip  [employer]. Use to test blood sugar 3 times a day.   100 each   11   .  insulin glargine (LANTUS SOLOSTAR) 100 UNIT/ML injection  Inject 30 Units into the skin 2 (two) times daily.   10 mL   11   .  Insulin Pen Needle (PEN NEEDLES 31GX5/16") 31G X 8 MM MISC  Inject 1 Container into the skin 3 (three) times daily. Use to inject insulin twice daily.  1 each   11   .  metoprolol (TOPROL-XL) 50 MG 24 hr tablet  Take 1 tablet (50 mg total) by mouth daily.   31 tablet   11   .  naproxen (NAPROSYN) 500 MG tablet  Take 1 tablet (500 mg total) by mouth 2 (two) times daily.   180 tablet   11   .  omeprazole (PRILOSEC) 20 MG capsule  Take 1 capsule (20 mg total) by mouth 2 (two) times daily.   62 capsule   11   .  potassium chloride SA (K-DUR,KLOR-CON) 20 MEQ tablet  Take 1 tablet (20 mEq total) by mouth daily.   31 tablet   11   .  simvastatin (ZOCOR) 40 MG tablet  Take 1 tablet (40 mg total) by mouth daily.   31 tablet   11   .  traMADol (ULTRAM) 50 MG tablet  Take 1 tablet (50 mg total) by mouth every 6 (six) hours as needed for pain.   20 tablet   0   .  methocarbamol (ROBAXIN) 500 MG tablet  Take 1 tablet (500 mg total) by mouth 2 (two) times daily as needed. For muscle spasm.   62 tablet   11   .  Misc Natural Products (WH[place] PO)  Take by mouth.           .  oxyCODONE-acetaminophen (PERCOCET) 5-325 MG per tablet  Take 1 tablet by mouth every 8 (eight) hours as needed. For pain.   90 tablet   0   .  polyethylene glycol (MIRALAX) powder  Use as instructed on the package.           Family History   Problem  Relation  Age of Onset   .  Diabetes  Mother     .  Heart disease  Mother     .  Prostate  cancer  Father      History      Social History   .  Marital Status:  Single       Spouse Name:  N/A       Number of Children:  N/A   .  Years of Education:  N/A      Social History Main Topics   .  Smoking status:  Current Everyday Smoker -- 0.4 packs/day   .  Smokeless tobacco:  None     Comment: Smokes 5-6 cigerettes per day   .  Alcohol Use:  None   .  Drug Use:  None   .  Sexually Active:  None      Other Topics  Concern   .  None      Social History Narrative     to get supplies nfrom [facility] per pt request.[phone]    Review of Systems: as per HPI    Objective:   Physical Exam: Filed Vitals:    General: resting in bed, not in acute distress HEENT: PERRL, EOMI, no scleral icterus Cardiac: S1/S2, RRR, No murmurs, gallops or rubs Pulm: Good air movement bilaterally, Clear to auscultation bilaterally, No rales, wheezing, rhonchi or rubs. Abd: Soft,  nondistended, nontender, no rebound pain, no organomegaly, BS present Ext: No rashes or edema, 2+DP/PT pulse bilaterally. Neuro: alert and oriented X3, cranial nerves II-XII grossly intact, muscle strength 5/5 in all extremeties,  sensation to light touch intact.     Assessment & Plan:    # Chronic back pain and Hip pain:  She  has had multiple imaging studies including plain films, MRI, and full body bone scan in the past.  No red flag symptoms, such as lost control of bladder and weakness. Her pain is minimal today. Will continue Naproxen and add tramadol prn and follow up.  # DIABETES MELLITUS, TYPE II : well controlled. A1c is 6.8 today. Will continue the current regimen and follow up. Will check her micro/abumin ratio today.   #  HYPERLIPIDEMIA - well controlled. Her LDL was 72 and AST/ALT were normal on 03/12/11.  Will continue with current regimen and follow up.   # CHF: well controlled. No sign of exacerbation. No edema in her lower legs or ankles. Patient reports that she can sleep well in the night without  PND. No palpitation. Will continue lasix, metoprolol, ASA, digoxin. Will check BMP today.   # HYPERTENSION -  BP is at goal and will continue the same regimen.   # HM:   1. She had negative Mammogram on 9/10 and 1/12. 2. Refused pap smear today 3. Refused pneumococcal vaccine today 4. Colonoscopy not due until 2019 5. Influenza vaccine not due until 05/04/12 6. Will give referral to ophthalmology today.

## 2011-12-17 LAB — BASIC METABOLIC PANEL WITH GFR
BUN: 10 mg/dL (ref 6–23)
CO2: 23 mEq/L (ref 19–32)
Calcium: 9.3 mg/dL (ref 8.4–10.5)
Creat: 0.64 mg/dL (ref 0.50–1.10)
Glucose, Bld: 138 mg/dL — ABNORMAL HIGH (ref 70–99)

## 2011-12-17 LAB — MICROALBUMIN / CREATININE URINE RATIO
Microalb Creat Ratio: 5.8 mg/g (ref 0.0–30.0)
Microalb, Ur: 1.59 mg/dL (ref 0.00–1.89)

## 2012-01-05 DIAGNOSIS — E119 Type 2 diabetes mellitus without complications: Secondary | ICD-10-CM | POA: Diagnosis not present

## 2012-01-05 DIAGNOSIS — H2510 Age-related nuclear cataract, unspecified eye: Secondary | ICD-10-CM | POA: Diagnosis not present

## 2012-03-16 ENCOUNTER — Encounter: Payer: Medicare Other | Admitting: Internal Medicine

## 2012-03-23 ENCOUNTER — Encounter: Payer: Self-pay | Admitting: Internal Medicine

## 2012-03-23 ENCOUNTER — Ambulatory Visit (INDEPENDENT_AMBULATORY_CARE_PROVIDER_SITE_OTHER): Payer: Medicare Other | Admitting: Internal Medicine

## 2012-03-23 VITALS — BP 100/70 | HR 80 | Temp 97.0°F | Ht 63.0 in | Wt 271.0 lb

## 2012-03-23 DIAGNOSIS — E785 Hyperlipidemia, unspecified: Secondary | ICD-10-CM

## 2012-03-23 DIAGNOSIS — Z0000 Encounter for general adult medical examination without abnormal findings: Secondary | ICD-10-CM | POA: Diagnosis not present

## 2012-03-23 DIAGNOSIS — E119 Type 2 diabetes mellitus without complications: Secondary | ICD-10-CM

## 2012-03-23 DIAGNOSIS — I509 Heart failure, unspecified: Secondary | ICD-10-CM

## 2012-03-23 DIAGNOSIS — M1990 Unspecified osteoarthritis, unspecified site: Secondary | ICD-10-CM | POA: Diagnosis not present

## 2012-03-23 DIAGNOSIS — I10 Essential (primary) hypertension: Secondary | ICD-10-CM

## 2012-03-23 LAB — GLUCOSE, CAPILLARY: Glucose-Capillary: 158 mg/dL — ABNORMAL HIGH (ref 70–99)

## 2012-03-23 MED ORDER — TRAMADOL HCL 50 MG PO TABS: 50.0000 mg | ORAL_TABLET | Freq: Four times a day (QID) | ORAL | Status: DC | PRN

## 2012-03-23 NOTE — Progress Notes (Signed)
Patient ID: Julie Elliott, female   DOB: Feb 08, 1956, 56 y.o.   MRN: [id number]   Subjective:   Patient ID: Julie Elliott female   DOB: Dec 07, 1955 56 y.o.   MRN: [id number]  HPI: Julie Elliott is a 56 y.o. lady with PMH outlined below presents for regular follow up.   Patient feels good today. She does not have any complaints today. She reports taking all her medications regularly.   1.) DM-II: She is taking glipizide 10 mg daily. She is also taking Lantus 30 units twice a day. She did not have symptoms for hypoglycemia. Today her A1c is 6.6. 2.) Hypertension: Patient's blood pressure is well controlled. Her blood pressure is a 100/70 today. 3.) Hyperlipidemia: Patient is currently taking Zocor 40 mg daily. She did not noticed any side effects, such as muscle pain. 4.) Her pain in right hip and lower back improved.  She takes tramadol occasionally. No red flag symptoms, such as lose control of bladder and weakness.    Denies fever, chills, fatigue, headaches,  cough, chest pain, SOB,  abdominal pain, diarrhea, constipation, dysuria, urgency, frequency, hematuria or leg swelling.   Past Medical History  Diagnosis Date  . Hypertension     well controlled  . DJD (degenerative joint disease)   . HLD (hyperlipidemia) 2008    stable, well controlled  . DM (diabetes mellitus) 2008    stable HgBA1C at 6.5  . GERD (gastroesophageal reflux disease)     well controlled on Omeprazole  . Chronic headaches    Current Outpatient Prescriptions  Medication Sig Dispense Refill  . aspirin 81 MG EC tablet Take 1 tablet (81 mg total) by mouth daily.  90 tablet  1  . B-D ULTRA-FINE 33 LANCETS MISC Use to test blood sugar up to 3 times a day.       . Calcium-Vitamin D 600-125 MG-UNIT TABS Take 1 tablet by mouth daily.  90 each  1  . digoxin (LANOXIN) 0.125 MG tablet Take 1 tablet (125 mcg total) by mouth daily.  90 tablet  1  . enalapril (VASOTEC) 20 MG tablet Take 1 tablet (20 mg total) by mouth 2 (two) times  daily.  180 tablet  1  . furosemide (LASIX) 80 MG tablet Take 1 tablet (80 mg total) by mouth daily.  90 tablet  1  . glipiZIDE (GLUCOTROL) 10 MG tablet Take 2 tablets (20 mg total) by mouth 2 (two) times daily.  180 tablet  1  . glucose blood (BAYER CONTOUR TEST) test strip [employer]. Use to test blood sugar 3 times a day.  100 each  11  . insulin glargine (LANTUS SOLOSTAR) 100 UNIT/ML injection Inject 30 Units into the skin 2 (two) times daily.  10 mL  11  . Insulin Pen Needle (PEN NEEDLES 31GX5/16") 31G X 8 MM MISC Inject 1 Container into the skin 3 (three) times daily. Use to inject insulin twice daily.  1 each  11  . metoprolol succinate (TOPROL-XL) 50 MG 24 hr tablet Take 1 tablet (50 mg total) by mouth daily.  90 tablet  1  . naproxen (NAPROSYN) 500 MG tablet Take 1 tablet (500 mg total) by mouth 2 (two) times daily.  180 tablet  1  . omeprazole (PRILOSEC) 20 MG capsule Take 1 capsule (20 mg total) by mouth 2 (two) times daily.  180 capsule  1  . potassium chloride SA (K-DUR,KLOR-CON) 20 MEQ tablet Take 1 tablet (20 mEq total) by mouth daily.  90 tablet  1  . simvastatin (ZOCOR) 40 MG tablet Take 1 tablet (40 mg total) by mouth daily.  90 tablet  1  . traMADol (ULTRAM) 50 MG tablet Take 1 tablet (50 mg total) by mouth every 6 (six) hours as needed for pain.  20 tablet  0   Family History  Problem Relation Age of Onset  . Diabetes Mother   . Heart disease Mother   . Prostate cancer Father    History   Social History  . Marital Status: Single    Spouse Name: N/A    Number of Children: N/A  . Years of Education: N/A   Social History Main Topics  . Smoking status: Current Everyday Smoker -- 0.4 packs/day  . Smokeless tobacco: None   Comment: Smokes 5-6 cigerettes per day  . Alcohol Use: None  . Drug Use: None  . Sexually Active: None   Other Topics Concern  . None   Social History Narrative   to get supplies nfrom [facility] per pt request.[phone]   Review of  Systems: General: no fevers, chills, no changes in body weight, no changes in appetite Skin: no rash HEENT: no blurry vision, hearing changes or sore throat Pulm: no dyspnea, coughing, wheezing CV: no chest pain, palpitations, shortness of breath Abd: no nausea/vomiting, abdominal pain, diarrhea/constipation GU: no dysuria, hematuria, polyuria Ext: no arthralgias, myalgias Neuro: no weakness, numbness, or tingling   Objective:  Physical Exam: Filed Vitals:   03/23/12 1521  BP: 100/70  Pulse: 80  Temp: 97 F (36.1 C)  TempSrc: Oral  Height: 5\' 3"  (1.6 m)  Weight: 271 lb (122.925 kg)  SpO2: 100%   Physical Exam: Filed Vitals:     General: resting in bed, not in acute distress HEENT: PERRL, EOMI, no scleral icterus Cardiac: S1/S2, RRR, No murmurs, gallops or rubs Pulm: Good air movement bilaterally, Clear to auscultation bilaterally, No rales, wheezing, rhonchi or rubs. Abd: Soft,  nondistended, nontender, no rebound pain, no organomegaly, BS present Ext: No rashes or edema, 2+DP/PT pulse bilaterally. Neuro: alert and oriented X3, cranial nerves II-XII grossly intact, muscle strength 5/5 in all extremeties,  sensation to light touch intact.      Assessment & Plan:

## 2012-03-23 NOTE — Assessment & Plan Note (Signed)
It is well controlled. She is taking glipizide 10 mg daily. She is also taking Lantus 30 units twice a day. She did not have symptoms for hypoglycemia. Today her A1c is 6.6. Will continue current regimen.

## 2012-03-23 NOTE — Assessment & Plan Note (Signed)
Hyperlipidemia is well controlled. She is currently taking Zocor 40 mg daily. She did not noticed any side effects, such as muscle pain. Her LDL was 72 on 03/12/11. Will continue current regimen.

## 2012-03-23 NOTE — Patient Instructions (Signed)
1. You have done great job in taking all your medications. I appreciate it very much. Please continue doing that. 2. Please take all medications as prescribed.  3. If you have worsening of your symptoms or new symptoms arise, please call the clinic ([phone]), or go to the ER immediately if symptoms are severe.     

## 2012-03-23 NOTE — Assessment & Plan Note (Signed)
Her pain in right hip and lower back improved.  She takes tramadol occasionally. No red flag symptoms, such as lose control of bladder and weakness. Will continue current regimen.

## 2012-03-23 NOTE — Assessment & Plan Note (Signed)
Hypertension is well controlled. Her blood pressure is a 100/70 today. Will continue current regimen.

## 2012-03-23 NOTE — Assessment & Plan Note (Signed)
It is well controlled. No sign of exacerbation. No edema in her lower legs or ankles. Patient reports that she can sleep well in the night without PND. No palpitation. Will continue lasix, metoprolol, ASA, digoxin.

## 2012-03-23 NOTE — Assessment & Plan Note (Signed)
1. She had negative Mammogram on 9/10 and 1/12.  2. Refused pap smear today  3. Refused pneumococcal vaccine today  4. Colonoscopy not due until 2019  5. Influenza vaccine not due until 05/04/12  6. Eye exam not due until 01/04/13.

## 2012-05-31 ENCOUNTER — Other Ambulatory Visit: Payer: Self-pay | Admitting: *Deleted

## 2012-05-31 MED ORDER — "PEN NEEDLES 5/16"" 31G X 8 MM MISC": 1.0000 | Freq: Three times a day (TID) | Status: DC

## 2012-06-08 ENCOUNTER — Other Ambulatory Visit: Payer: Self-pay | Admitting: Internal Medicine

## 2012-06-08 NOTE — Telephone Encounter (Signed)
Has Nov appt 

## 2012-06-22 ENCOUNTER — Encounter: Payer: Self-pay | Admitting: Internal Medicine

## 2012-06-22 ENCOUNTER — Ambulatory Visit (INDEPENDENT_AMBULATORY_CARE_PROVIDER_SITE_OTHER): Payer: Medicare Other | Admitting: Internal Medicine

## 2012-06-22 VITALS — BP 120/80 | HR 80 | Temp 97.0°F | Ht 63.5 in | Wt 274.1 lb

## 2012-06-22 DIAGNOSIS — R52 Pain, unspecified: Secondary | ICD-10-CM | POA: Diagnosis not present

## 2012-06-22 DIAGNOSIS — Z0000 Encounter for general adult medical examination without abnormal findings: Secondary | ICD-10-CM

## 2012-06-22 DIAGNOSIS — K219 Gastro-esophageal reflux disease without esophagitis: Secondary | ICD-10-CM | POA: Diagnosis not present

## 2012-06-22 DIAGNOSIS — I509 Heart failure, unspecified: Secondary | ICD-10-CM | POA: Diagnosis not present

## 2012-06-22 DIAGNOSIS — M1990 Unspecified osteoarthritis, unspecified site: Secondary | ICD-10-CM | POA: Diagnosis not present

## 2012-06-22 DIAGNOSIS — E785 Hyperlipidemia, unspecified: Secondary | ICD-10-CM | POA: Diagnosis not present

## 2012-06-22 DIAGNOSIS — E119 Type 2 diabetes mellitus without complications: Secondary | ICD-10-CM

## 2012-06-22 DIAGNOSIS — I10 Essential (primary) hypertension: Secondary | ICD-10-CM

## 2012-06-22 LAB — GLUCOSE, CAPILLARY: Glucose-Capillary: 137 mg/dL — ABNORMAL HIGH (ref 70–99)

## 2012-06-22 LAB — POCT GLYCOSYLATED HEMOGLOBIN (HGB A1C): Hemoglobin A1C: 7

## 2012-06-22 MED ORDER — NAPROXEN 500 MG PO TABS: 500.0000 mg | ORAL_TABLET | Freq: Two times a day (BID) | ORAL | Status: DC

## 2012-06-22 MED ORDER — SIMVASTATIN 40 MG PO TABS: 40.0000 mg | ORAL_TABLET | Freq: Every day | ORAL | Status: DC

## 2012-06-22 MED ORDER — GLIPIZIDE 10 MG PO TABS: 10.0000 mg | ORAL_TABLET | Freq: Two times a day (BID) | ORAL | Status: DC

## 2012-06-22 MED ORDER — TRAMADOL HCL 50 MG PO TABS: 50.0000 mg | ORAL_TABLET | Freq: Four times a day (QID) | ORAL | Status: AC | PRN

## 2012-06-22 MED ORDER — OMEPRAZOLE 20 MG PO CPDR: 20.0000 mg | DELAYED_RELEASE_CAPSULE | Freq: Two times a day (BID) | ORAL | Status: DC

## 2012-06-22 MED ORDER — ENALAPRIL MALEATE 20 MG PO TABS: 20.0000 mg | ORAL_TABLET | Freq: Two times a day (BID) | ORAL | Status: DC

## 2012-06-22 MED ORDER — POTASSIUM CHLORIDE CRYS ER 20 MEQ PO TBCR: 20.0000 meq | EXTENDED_RELEASE_TABLET | Freq: Every day | ORAL | Status: DC

## 2012-06-22 MED ORDER — DIGOXIN 125 MCG PO TABS: 125.0000 ug | ORAL_TABLET | Freq: Every day | ORAL | Status: DC

## 2012-06-22 MED ORDER — INSULIN GLARGINE 100 UNIT/ML ~~LOC~~ SOLN: 30.0000 [IU] | Freq: Two times a day (BID) | SUBCUTANEOUS | Status: DC

## 2012-06-22 MED ORDER — GLUCOSE BLOOD VI STRP: ORAL_STRIP | Status: DC

## 2012-06-22 MED ORDER — INSULIN PEN NEEDLE 32G X 4 MM MISC: Status: DC

## 2012-06-22 MED ORDER — METOPROLOL SUCCINATE ER 50 MG PO TB24: 50.0000 mg | ORAL_TABLET | Freq: Every day | ORAL | Status: DC

## 2012-06-22 MED ORDER — FUROSEMIDE 80 MG PO TABS: 80.0000 mg | ORAL_TABLET | Freq: Every day | ORAL | Status: DC

## 2012-06-22 NOTE — Assessment & Plan Note (Signed)
Hypertension is well controlled. Her blood pressure is 120/80 today. Will continue current regimen.

## 2012-06-22 NOTE — Assessment & Plan Note (Addendum)
1. She had negative Mammogram on 9/10 and 1/12.  2. Refused pap smear, pneumococcal vaccine, Influenza vaccine and tetanus vaccine, will postpone.  3. Eye exam, foot examination and colonoscopy are up-to-date.

## 2012-06-22 NOTE — Assessment & Plan Note (Signed)
It is well controlled.Today her A1c is 7.0.  She is taking glipizide 10 mg daily. She is also taking Lantus 30 units twice a day. She did not have symptoms for hypoglycemia.  Will continue current regimen.

## 2012-06-22 NOTE — Progress Notes (Signed)
Patient ID: Julie Elliott, female   DOB: 01-08-1956, 56 y.o.   MRN: [id number]  Subjective:   Patient ID: Julie Elliott female   DOB: 1955-11-29 56 y.o.   MRN: [id number]  HPI: [name] is a 56 y.o.   [name] is a 56 y.o. lady with PMH outlined below, who presents for regular follow up.   Patient reports that she has been taking all her medications regularly. She feels good without without any new complaints. She wants her medication be refilled today.  1.) DM-II: She is currently taking Lantus 30 units twice a day. Today her A1c is 7.0. Marland KitchenShe is also taking glipizide 10 mg daily. She did not have symptoms for hypoglycemia.   2.) Hypertension: Patient's blood pressure is well controlled. Her blood pressure is 120/80 today.  3.) Hyperlipidemia: Patient is currently taking Zocor 40 mg daily. She did not noticed any side effects, such as muscle pain. Her AST and ALT was normal at 03/12/11.  Denies fever, chills, fatigue, headaches,  cough, chest pain, SOB,  abdominal pain, diarrhea, constipation, dysuria, urgency, frequency, hematuria.   Past Medical History  Diagnosis Date  . Hypertension     well controlled  . DJD (degenerative joint disease)   . HLD (hyperlipidemia) 2008    stable, well controlled  . DM (diabetes mellitus) 2008    stable HgBA1C at 6.5  . GERD (gastroesophageal reflux disease)     well controlled on Omeprazole  . Chronic headaches    Current Outpatient Prescriptions  Medication Sig Dispense Refill  . aspirin 81 MG EC tablet Take 1 tablet (81 mg total) by mouth daily.  90 tablet  1  . B-D ULTRA-FINE 33 LANCETS MISC Use to test blood sugar up to 3 times a day.       . Calcium-Vitamin D 600-125 MG-UNIT TABS Take 1 tablet by mouth daily.  90 each  1  . digoxin (LANOXIN) 0.125 MG tablet Take 1 tablet (125 mcg total) by mouth daily.  90 tablet  3  . enalapril (VASOTEC) 20 MG tablet Take 1 tablet (20 mg total) by mouth 2 (two) times daily.  180 tablet  3  . furosemide  (LASIX) 80 MG tablet Take 1 tablet (80 mg total) by mouth daily.  90 tablet  3  . glipiZIDE (GLUCOTROL) 10 MG tablet Take 1 tablet (10 mg total) by mouth 2 (two) times daily before a meal.  240 tablet  3  . glucose blood (BAYER CONTOUR TEST) test strip [employer]. Use to test blood sugar 3 times a day.  100 each  11  . insulin glargine (LANTUS SOLOSTAR) 100 UNIT/ML injection Inject 30 Units into the skin 2 (two) times daily.  10 mL  11  . metoprolol succinate (TOPROL-XL) 50 MG 24 hr tablet Take 1 tablet (50 mg total) by mouth daily.  90 tablet  2  . naproxen (NAPROSYN) 500 MG tablet Take 1 tablet (500 mg total) by mouth 2 (two) times daily.  180 tablet  2  . omeprazole (PRILOSEC) 20 MG capsule Take 1 capsule (20 mg total) by mouth 2 (two) times daily.  180 capsule  2  . potassium chloride SA (K-DUR,KLOR-CON) 20 MEQ tablet Take 1 tablet (20 mEq total) by mouth daily.  90 tablet  2  . simvastatin (ZOCOR) 40 MG tablet Take 1 tablet (40 mg total) by mouth daily.  90 tablet  2  . traMADol (ULTRAM) 50 MG tablet Take 1 tablet (50 mg total) by mouth  every 6 (six) hours as needed for pain.  20 tablet  0  . [DISCONTINUED] digoxin (LANOXIN) 0.125 MG tablet Take 1 tablet (125 mcg total) by mouth daily.  90 tablet  1  . [DISCONTINUED] enalapril (VASOTEC) 20 MG tablet Take 1 tablet (20 mg total) by mouth 2 (two) times daily.  180 tablet  1  . [DISCONTINUED] furosemide (LASIX) 80 MG tablet Take 1 tablet (80 mg total) by mouth daily.  90 tablet  1  . [DISCONTINUED] glipiZIDE (GLUCOTROL) 10 MG tablet TAKE TWO TABLETS TWICE DAILY  180 tablet  3  . [DISCONTINUED] glucose blood (BAYER CONTOUR TEST) test strip [employer]. Use to test blood sugar 3 times a day.  100 each  11  . [DISCONTINUED] insulin glargine (LANTUS SOLOSTAR) 100 UNIT/ML injection Inject 30 Units into the skin 2 (two) times daily.  10 mL  11  . [DISCONTINUED] metoprolol succinate (TOPROL-XL) 50 MG 24 hr tablet Take 1 tablet (50 mg total) by mouth daily.  90  tablet  1  . [DISCONTINUED] naproxen (NAPROSYN) 500 MG tablet Take 1 tablet (500 mg total) by mouth 2 (two) times daily.  180 tablet  1  . [DISCONTINUED] omeprazole (PRILOSEC) 20 MG capsule Take 1 capsule (20 mg total) by mouth 2 (two) times daily.  180 capsule  1  . [DISCONTINUED] potassium chloride SA (K-DUR,KLOR-CON) 20 MEQ tablet Take 1 tablet (20 mEq total) by mouth daily.  90 tablet  1  . [DISCONTINUED] simvastatin (ZOCOR) 40 MG tablet Take 1 tablet (40 mg total) by mouth daily.  90 tablet  1  . Insulin Pen Needle 32G X 4 MM MISC Use for insulin injection, bid. Diagnosis code is 250.00 for DM-II.  100 each  3   Family History  Problem Relation Age of Onset  . Diabetes Mother   . Heart disease Mother   . Prostate cancer Father    History   Social History  . Marital Status: Single    Spouse Name: N/A    Number of Children: N/A  . Years of Education: N/A   Social History Main Topics  . Smoking status: Current Every Day Smoker -- 0.4 packs/day  . Smokeless tobacco: None     Comment: Smokes 5-6 cigerettes per day  . Alcohol Use: None  . Drug Use: None  . Sexually Active: None   Other Topics Concern  . None   Social History Narrative   to get supplies nfrom [facility] per pt request.[phone]   Review of Systems: General: no fevers, chills, no changes in body weight, no changes in appetite Skin: no rash HEENT: no blurry vision, hearing changes or sore throat Pulm: no dyspnea, coughing, wheezing CV: no chest pain, palpitations, shortness of breath Abd: no nausea/vomiting, abdominal pain, diarrhea/constipation GU: no dysuria, hematuria, polyuria Ext: has minimal lower leg edema bilaterally. Neuro: no weakness, numbness, or tingling   Objective:  Physical Exam: Filed Vitals:   06/22/12 1527  BP: 120/80  Pulse: 80  Temp: 97 F (36.1 C)  TempSrc: Oral  Height: 5' 3.5" (1.613 m)  Weight: 274 lb 1.6 oz (124.331 kg)  SpO2: 100%   Physical Exam:  General:  resting in bed, not in acute distress HEENT: PERRL, EOMI, no scleral icterus Cardiac: S1/S2, RRR, No murmurs, gallops or rubs Pulm: Good air movement bilaterally, Clear to auscultation bilaterally, No rales, wheezing, rhonchi or rubs. Abd: Soft,  nondistended, nontender, no rebound pain, no organomegaly, BS present Ext: has trace lower leg edema bilaterally,  2+DP/PT pulse bilaterally. Neuro: alert and oriented X3, cranial nerves II-XII grossly intact, muscle strength 5/5 in all extremeties,  sensation to light touch intact.      Assessment & Plan:

## 2012-06-22 NOTE — Assessment & Plan Note (Signed)
Hyperlipidemia is well controlled. She is currently taking Zocor 40 mg daily. She did not noticed any side effects, such as muscle pain. Her LDL was 72 on 03/12/11. Patient refused lipid profile today. We'll continue current regimen.

## 2012-06-22 NOTE — Patient Instructions (Signed)
1. You have done great job in taking all your medications. I appreciate it very much. Please continue doing that. 2. Please take all medications as prescribed.  3. If you have worsening of your symptoms or new symptoms arise, please call the clinic ([phone]), or go to the ER immediately if symptoms are severe.     

## 2012-09-28 ENCOUNTER — Encounter: Payer: Self-pay | Admitting: Internal Medicine

## 2012-09-28 ENCOUNTER — Ambulatory Visit (INDEPENDENT_AMBULATORY_CARE_PROVIDER_SITE_OTHER): Payer: Medicare Other | Admitting: Internal Medicine

## 2012-09-28 VITALS — BP 100/80 | Temp 97.8°F | Ht 63.5 in | Wt 270.9 lb

## 2012-09-28 DIAGNOSIS — E119 Type 2 diabetes mellitus without complications: Secondary | ICD-10-CM | POA: Diagnosis not present

## 2012-09-28 DIAGNOSIS — I10 Essential (primary) hypertension: Secondary | ICD-10-CM | POA: Diagnosis not present

## 2012-09-28 DIAGNOSIS — E785 Hyperlipidemia, unspecified: Secondary | ICD-10-CM

## 2012-09-28 DIAGNOSIS — I509 Heart failure, unspecified: Secondary | ICD-10-CM | POA: Diagnosis not present

## 2012-09-28 LAB — GLUCOSE, CAPILLARY: Glucose-Capillary: 153 mg/dL — ABNORMAL HIGH (ref 70–99)

## 2012-09-28 LAB — POCT GLYCOSYLATED HEMOGLOBIN (HGB A1C): Hemoglobin A1C: 6.8

## 2012-09-28 NOTE — Assessment & Plan Note (Signed)
Lab Results  Component Value Date   HGBA1C 6.8 09/28/2012   HGBA1C 7.0 06/22/2012   HGBA1C 6.6 03/23/2012     Assessment:  Diabetes control: good control (HgbA1C at goal)  Progress toward A1C goal:  at goal  Comments:  Plan:  Medications:  continue current medications  Home glucose monitoring:   Frequency:  twice a day   Timing:  AM and evening  Instruction/counseling given: reminded to bring blood glucose meter & log to each visit  Educational resources provided: brochure  Self management tools provided:    Other plans: continue current regimen.

## 2012-09-28 NOTE — Assessment & Plan Note (Signed)
LDL was 72 on 03/12/11. Patient is taking Zocor 40 mg daily. She did not noticed any side effects, such as muscle pain. We'll continue current regimen. We'll check lipid profile.

## 2012-09-28 NOTE — Progress Notes (Signed)
Patient ID: Julie Elliott, female   DOB: 07-16-1956, 57 y.o.   MRN: [id number] Subjective:   Patient ID: Julie Elliott female   DOB: 27-Jul-1956 57 y.o.   MRN: [id number]  CC:   #   month follow up visit.            Hospital followup visit.            Acute visit due to # HPI:  [name] is a 57 y.o. lady with past medical history as outlined below, who presents for a followup visit today.  Patient reports that she has been taking all her medications regularly. She feels good without any new complaints. She wants her medication be refilled today.  1.) DM-II: She is currently taking Lantus 30 units twice a day. Today her A1c is 6.8. Marland KitchenShe is also taking glipizide 10 mg bid daily. She did not have symptoms for hypoglycemia.   2.) Hypertension: Patient's blood pressure is well controlled. Her blood pressure is 120/80 today.  3.) Hyperlipidemia: Patient is currently taking Zocor 40 mg daily. She did not noticed any side effects, such as muscle pain. Her AST and ALT was normal at 03/12/11.  4. CHF: Patient feels normal. Patient does not have chest pain, shortness of breath, palpitation, cough or leg edema.  Denies fever, chills, fatigue, headaches,  cough, chest pain, SOB,  abdominal pain, diarrhea, constipation, dysuria, urgency, frequency, hematuria.      Past Medical History  Diagnosis Date  . Hypertension     well controlled  . DJD (degenerative joint disease)   . HLD (hyperlipidemia) 2008    stable, well controlled  . DM (diabetes mellitus) 2008    stable HgBA1C at 6.5  . GERD (gastroesophageal reflux disease)     well controlled on Omeprazole  . Chronic headaches    Current Outpatient Prescriptions  Medication Sig Dispense Refill  . aspirin 81 MG EC tablet Take 1 tablet (81 mg total) by mouth daily.  90 tablet  1  . B-D ULTRA-FINE 33 LANCETS MISC Use to test blood sugar up to 3 times a day.       . Calcium-Vitamin D 600-125 MG-UNIT TABS Take 1 tablet by mouth daily.  90 each  1  .  digoxin (LANOXIN) 0.125 MG tablet Take 1 tablet (125 mcg total) by mouth daily.  90 tablet  3  . enalapril (VASOTEC) 20 MG tablet Take 1 tablet (20 mg total) by mouth 2 (two) times daily.  180 tablet  3  . furosemide (LASIX) 80 MG tablet Take 1 tablet (80 mg total) by mouth daily.  90 tablet  3  . glipiZIDE (GLUCOTROL) 10 MG tablet Take 1 tablet (10 mg total) by mouth 2 (two) times daily before a meal.  240 tablet  3  . glucose blood (BAYER CONTOUR TEST) test strip [employer]. Use to test blood sugar 3 times a day.  100 each  11  . insulin glargine (LANTUS SOLOSTAR) 100 UNIT/ML injection Inject 30 Units into the skin 2 (two) times daily.  10 mL  11  . Insulin Pen Needle 32G X 4 MM MISC Use for insulin injection, bid. Diagnosis code is 250.00 for DM-II.  100 each  3  . metoprolol succinate (TOPROL-XL) 50 MG 24 hr tablet Take 1 tablet (50 mg total) by mouth daily.  90 tablet  2  . naproxen (NAPROSYN) 500 MG tablet Take 1 tablet (500 mg total) by mouth 2 (two) times daily.  180 tablet  2  . omeprazole (PRILOSEC) 20 MG capsule Take 1 capsule (20 mg total) by mouth 2 (two) times daily.  180 capsule  2  . potassium chloride SA (K-DUR,KLOR-CON) 20 MEQ tablet Take 1 tablet (20 mEq total) by mouth daily.  90 tablet  2  . simvastatin (ZOCOR) 40 MG tablet Take 1 tablet (40 mg total) by mouth daily.  90 tablet  2  . traMADol (ULTRAM) 50 MG tablet Take 1 tablet (50 mg total) by mouth every 6 (six) hours as needed for pain.  20 tablet  0   No current facility-administered medications for this visit.   Family History  Problem Relation Age of Onset  . Diabetes Mother   . Heart disease Mother   . Prostate cancer Father    History   Social History  . Marital Status: Single    Spouse Name: N/A    Number of Children: N/A  . Years of Education: N/A   Social History Main Topics  . Smoking status: Current Every Day Smoker -- 0.30 packs/day    Types: Cigarettes  . Smokeless tobacco: None     Comment: Smokes  5-6 cigerettes per day  . Alcohol Use: None  . Drug Use: None  . Sexually Active: None   Other Topics Concern  . None   Social History Narrative   to get supplies nfrom [facility] per pt request.[phone]    Review of Systems: As per HPI  Objective:  Physical Exam: Filed Vitals:   09/28/12 1405  Temp: 97.8 F (36.6 C)  TempSrc: Oral  Height: 5' 3.5" (1.613 m)  Weight: 270 lb 14.4 oz (122.879 kg)  SpO2: 100%   Bp: 100/80 mmHg  Constitutional: Vital signs reviewed.  Patient is a well-developed and well-nourished, in no acute distress and cooperative with exam.   HEENT:  Head: Normocephalic and atraumatic Ear: TM normal bilaterally Mouth: no erythema or exudates, MMM Eyes: PERRL, EOMI, conjunctivae normal, No scleral icterus.  Neck: Supple, Trachea midline normal ROM, No JVD, mass, thyromegaly, or carotid bruit present. No lymph node enlargement. Cardiovascular: RRR, S1 normal, S2 normal, no MRG, pulses symmetric and intact bilaterally Pulmonary/Chest: CTAB, no wheezes, rales, or rhonchi Abdominal: Soft. Non-tender, non-distended, bowel sounds are normal, no masses, organomegaly, or guarding present.  GU: no CVA tenderness Musculoskeletal: No joint deformities, erythema, or stiffness, ROM full and non-tender Hematology: no cervical, inginal, or axillary adenopathy.  Neurological: A&O x3, Strength is normal and symmetric bilaterally, cranial nerve II-XII are grossly intact, no focal motor deficit, sensory intact to light touch bilaterally.  Skin: Warm, dry and intact. No rash, cyanosis, or clubbing.  Psychiatric: Normal mood and affect. speech and behavior is normal. Judgment and thought content normal. Cognition and memory are normal.   Assessment & Plan:

## 2012-09-28 NOTE — Assessment & Plan Note (Signed)
BP Readings from Last 3 Encounters:  06/22/12 120/80  03/23/12 100/70  08/19/11 126/77    Lab Results  Component Value Date   NA 139 12/16/2011   K 4.3 12/16/2011   CREATININE 0.64 12/16/2011    Assessment:  Blood pressure control: controlled  Progress toward BP goal:  at goal  Comments:   Plan:  Medications:  continue current medications  Educational resources provided: brochure  Self management tools provided:    Other plans: bp is well controlled. Will continue current regimen

## 2012-09-28 NOTE — Patient Instructions (Signed)
1. You have done great job in taking all your medications. I appreciate it very much. Please continue doing that. 2. Please take all medications as prescribed.  3. If you have worsening of your symptoms or new symptoms arise, please call the clinic ([phone]), or go to the ER immediately if symptoms are severe.     

## 2012-09-28 NOTE — Assessment & Plan Note (Signed)
Currently patient does not have any signs of CHF exacerbation. Lung auscultation is clear bilaterally. No leg edema. Patient does not have chest pain, shortness of breath and palpitation. Patient is on Enalapril, digoxin, aspirin, metoprolol and potassium supplement. Will continue current regimen. Will check BMP.

## 2012-09-29 LAB — BASIC METABOLIC PANEL WITH GFR
Calcium: 10 mg/dL (ref 8.4–10.5)
Creat: 0.7 mg/dL (ref 0.50–1.10)
GFR, Est African American: >89 mL/min
GFR, Est Non African American: >89 mL/min

## 2012-09-29 LAB — LIPID PANEL: Cholesterol: 133 mg/dL (ref 0–200)

## 2012-10-25 ENCOUNTER — Encounter

## 2012-12-21 ENCOUNTER — Ambulatory Visit (INDEPENDENT_AMBULATORY_CARE_PROVIDER_SITE_OTHER): Payer: Medicare Other | Admitting: Internal Medicine

## 2012-12-21 ENCOUNTER — Encounter: Payer: Self-pay | Admitting: Internal Medicine

## 2012-12-21 VITALS — BP 140/80 | HR 80 | Temp 97.8°F | Ht 63.5 in | Wt 267.1 lb

## 2012-12-21 DIAGNOSIS — I509 Heart failure, unspecified: Secondary | ICD-10-CM

## 2012-12-21 DIAGNOSIS — E119 Type 2 diabetes mellitus without complications: Secondary | ICD-10-CM

## 2012-12-21 DIAGNOSIS — I10 Essential (primary) hypertension: Secondary | ICD-10-CM

## 2012-12-21 LAB — POCT GLYCOSYLATED HEMOGLOBIN (HGB A1C): Hemoglobin A1C: 6.4

## 2012-12-21 LAB — GLUCOSE, CAPILLARY: Glucose-Capillary: 131 mg/dL — ABNORMAL HIGH (ref 70–99)

## 2012-12-21 NOTE — Patient Instructions (Addendum)
1. You have done great job in taking all your medications. I appreciate it very much. Please continue doing that. Please stop taking digoxin from now. Your 2D echo showed that your heart EF is normal (55 to 65% on 4/08/10/2005).   2. Please take all medications as prescribed.  3. If you have worsening of your symptoms or new symptoms arise, please call the clinic ([phone]), or go to the ER immediately if symptoms are severe.

## 2012-12-21 NOTE — Assessment & Plan Note (Signed)
BP Readings from Last 3 Encounters:  12/21/12 140/80  09/28/12 100/80  06/22/12 120/80    Lab Results  Component Value Date   NA 141 09/28/2012   K 4.0 09/28/2012   CREATININE 0.70 09/28/2012    Assessment: Blood pressure control: controlled Progress toward BP goal:  at goal Comments:   Plan: Medications:  continue current medications Educational resources provided: brochure Self management tools provided: home blood pressure logbook Other plans: well controlled.

## 2012-12-21 NOTE — Assessment & Plan Note (Signed)
Lab Results  Component Value Date   HGBA1C 6.4 12/21/2012   HGBA1C 6.8 09/28/2012   HGBA1C 7.0 06/22/2012     Assessment: Diabetes control: good control (HgbA1C at goal) Progress toward A1C goal:  at goal Comments:   Plan: Medications:  continue current medications Home glucose monitoring: Frequency: once a day Timing: before breakfast Instruction/counseling given: reminded to bring blood glucose meter & log to each visit Educational resources provided: brochure Self management tools provided: copy of home glucose meter download;home glucose meter Other plans: well controlled.

## 2012-12-21 NOTE — Progress Notes (Signed)
Case discussed with Dr. Niu (at time of visit, soon after the resident saw the patient).  We reviewed the resident's history and exam and pertinent patient test results.  I agree with the assessment, diagnosis, and plan of care documented in the resident's note.   

## 2012-12-21 NOTE — Progress Notes (Signed)
Patient ID: Julie Elliott, female   DOB: Nov 21, 1955, 57 y.o.   MRN: [id number] Subjective:   Patient ID: Julie Elliott female   DOB: [birth year] 57 y.o.   MRN: [id number]  CC:   3 month follow up visit.   HPI:  [name] is a 57 y.o. lady   with past medical history as outlined below, who presents for a followup visit today.  1.) DM-II: She is currently taking Lantus 30 units twice a day. Today her A1c is 6.4. Marland KitchenShe is also taking glipizide 10 mg bid daily. She did not have symptoms for hypoglycemia.   2.) Hypertension: Patient's blood pressure is well controlled. Her blood pressure is 140/80  today.  3.) Hyperlipidemia: Patient is currently taking Zocor 40 mg daily. She did not noticed any side effects, such as muscle pain. Her AST and ALT was normal at 03/12/11. LDL was 77 on 09/28/12.  4. CHF: Patient feels normal. Patient does not have chest pain, shortness of breath, palpitation, cough or leg edema. Her last 2E echo on 11/18/05 showed EF of 55 to 65%.   Denies fever, chills, fatigue, headaches,  cough, chest pain, SOB,  abdominal pain, diarrhea, constipation, dysuria, urgency, frequency, hematuria.   Past Medical History  Diagnosis Date  . Hypertension     well controlled  . DJD (degenerative joint disease)   . HLD (hyperlipidemia) 2008    stable, well controlled  . DM (diabetes mellitus) 2008    stable HgBA1C at 6.5  . GERD (gastroesophageal reflux disease)     well controlled on Omeprazole  . Chronic headaches    Current Outpatient Prescriptions  Medication Sig Dispense Refill  . aspirin 81 MG EC tablet Take 1 tablet (81 mg total) by mouth daily.  90 tablet  1  . B-D ULTRA-FINE 33 LANCETS MISC Use to test blood sugar up to 3 times a day.       . Calcium-Vitamin D 600-125 MG-UNIT TABS Take 1 tablet by mouth daily.  90 each  1  . digoxin (LANOXIN) 0.125 MG tablet Take 1 tablet (125 mcg total) by mouth daily.  90 tablet  3  . enalapril (VASOTEC) 20 MG tablet Take 1 tablet (20 mg  total) by mouth 2 (two) times daily.  180 tablet  3  . furosemide (LASIX) 80 MG tablet Take 1 tablet (80 mg total) by mouth daily.  90 tablet  3  . glipiZIDE (GLUCOTROL) 10 MG tablet Take 1 tablet (10 mg total) by mouth 2 (two) times daily before a meal.  240 tablet  3  . glucose blood (BAYER CONTOUR TEST) test strip [employer]. Use to test blood sugar 3 times a day.  100 each  11  . insulin glargine (LANTUS SOLOSTAR) 100 UNIT/ML injection Inject 30 Units into the skin 2 (two) times daily.  10 mL  11  . Insulin Pen Needle 32G X 4 MM MISC Use for insulin injection, bid. Diagnosis code is 250.00 for DM-II.  100 each  3  . metoprolol succinate (TOPROL-XL) 50 MG 24 hr tablet Take 1 tablet (50 mg total) by mouth daily.  90 tablet  2  . naproxen (NAPROSYN) 500 MG tablet Take 1 tablet (500 mg total) by mouth 2 (two) times daily.  180 tablet  2  . omeprazole (PRILOSEC) 20 MG capsule Take 1 capsule (20 mg total) by mouth 2 (two) times daily.  180 capsule  2  . potassium chloride SA (K-DUR,KLOR-CON) 20 MEQ tablet Take 1 tablet (20  mEq total) by mouth daily.  90 tablet  2  . simvastatin (ZOCOR) 40 MG tablet Take 1 tablet (40 mg total) by mouth daily.  90 tablet  2  . traMADol (ULTRAM) 50 MG tablet Take 1 tablet (50 mg total) by mouth every 6 (six) hours as needed for pain.  20 tablet  0   No current facility-administered medications for this visit.   Family History  Problem Relation Age of Onset  . Diabetes Mother   . Heart disease Mother   . Prostate cancer Father    History   Social History  . Marital Status: Single    Spouse Name: N/A    Number of Children: N/A  . Years of Education: N/A   Social History Main Topics  . Smoking status: Current Every Day Smoker -- 0.30 packs/day    Types: Cigarettes  . Smokeless tobacco: Not on file     Comment: Smokes 5-6 cigerettes per day  . Alcohol Use: Not on file  . Drug Use: Not on file  . Sexually Active: Not on file   Other Topics Concern  . Not  on file   Social History Narrative   to get supplies nfrom [facility] per pt request.[phone]    Review of Systems: as per HPI   Objective:  Physical Exam: There were no vitals filed for this visit. Constitutional: Vital signs reviewed.  Patient is a well-developed and well-nourished, in no acute distress and cooperative with exam.   HEENT:  Head: Normocephalic and atraumatic Ear: TM normal bilaterally Mouth: no erythema or exudates, MMM Eyes: PERRL, EOMI, conjunctivae normal, No scleral icterus.  Neck: Supple, Trachea midline normal ROM, No JVD, mass, thyromegaly, or carotid bruit present. No lymph node enlargement. Cardiovascular: RRR, S1 normal, S2 normal, no MRG, pulses symmetric and intact bilaterally Pulmonary/Chest: CTAB, no wheezes, rales, or rhonchi Abdominal: Soft. Non-tender, non-distended, bowel sounds are normal, no masses, organomegaly, or guarding present.  GU: no CVA tenderness Musculoskeletal: No joint deformities, erythema, or stiffness, ROM full and non-tender Hematology: no cervical, inginal, or axillary adenopathy.  Neurological: A&O x3, Strength is normal and symmetric bilaterally, cranial nerve II-XII are grossly intact, no focal motor deficit, sensory intact to light touch bilaterally.  Psychiatric: Normal mood and affect. speech and behavior is normal. Judgment and thought content normal. Cognition and memory are normal.   Assessment & Plan:

## 2012-12-21 NOTE — Assessment & Plan Note (Addendum)
Patient's last 2-D echo on 11/18/05 showed EF 55-65%. Currently she does not have any symptoms, such as PND and orthopnea. She does not have fluid retention such as leg edema. She is currently taking Lasix 80 mg daily, aspirin, enalapril and metoprolol. She is also taking digoxin.  -I dicussed with Dr. Aundria Rud about her regimen. We think that she does not need to take digoxin any more. Will discontinue digoxin from now. -will continue Lasix, metoprolol, aspirin at current doses.

## 2013-01-12 ENCOUNTER — Other Ambulatory Visit: Payer: Self-pay | Admitting: *Deleted

## 2013-01-12 ENCOUNTER — Encounter: Payer: Self-pay | Admitting: Dietician

## 2013-01-12 DIAGNOSIS — E119 Type 2 diabetes mellitus without complications: Secondary | ICD-10-CM

## 2013-01-12 DIAGNOSIS — M1990 Unspecified osteoarthritis, unspecified site: Secondary | ICD-10-CM

## 2013-01-12 DIAGNOSIS — E785 Hyperlipidemia, unspecified: Secondary | ICD-10-CM

## 2013-01-12 DIAGNOSIS — I5042 Chronic combined systolic (congestive) and diastolic (congestive) heart failure: Secondary | ICD-10-CM

## 2013-01-12 MED ORDER — NAPROXEN 500 MG PO TABS: 500.0000 mg | ORAL_TABLET | Freq: Two times a day (BID) | ORAL | Status: DC

## 2013-01-12 MED ORDER — POTASSIUM CHLORIDE CRYS ER 20 MEQ PO TBCR: 20.0000 meq | EXTENDED_RELEASE_TABLET | Freq: Every day | ORAL | Status: DC

## 2013-01-12 MED ORDER — SIMVASTATIN 40 MG PO TABS: 40.0000 mg | ORAL_TABLET | Freq: Every day | ORAL | Status: DC

## 2013-01-12 MED ORDER — METOPROLOL SUCCINATE ER 50 MG PO TB24: 50.0000 mg | ORAL_TABLET | Freq: Every day | ORAL | Status: DC

## 2013-01-12 MED ORDER — INSULIN PEN NEEDLE 32G X 4 MM MISC: Status: DC

## 2013-01-12 NOTE — Telephone Encounter (Signed)
[facility] has closed ; needs new rx .

## 2013-01-13 ENCOUNTER — Other Ambulatory Visit: Payer: Self-pay | Admitting: *Deleted

## 2013-01-13 DIAGNOSIS — K219 Gastro-esophageal reflux disease without esophagitis: Secondary | ICD-10-CM

## 2013-01-14 MED ORDER — OMEPRAZOLE 20 MG PO CPDR: 20.0000 mg | DELAYED_RELEASE_CAPSULE | Freq: Two times a day (BID) | ORAL | Status: DC

## 2013-01-25 ENCOUNTER — Ambulatory Visit (INDEPENDENT_AMBULATORY_CARE_PROVIDER_SITE_OTHER): Payer: Medicare Other | Admitting: Internal Medicine

## 2013-01-25 ENCOUNTER — Encounter: Payer: Self-pay | Admitting: Internal Medicine

## 2013-01-25 ENCOUNTER — Encounter: Payer: Medicare Other | Admitting: Internal Medicine

## 2013-01-25 VITALS — BP 110/80 | HR 80 | Temp 98.1°F | Ht 63.5 in | Wt 268.2 lb

## 2013-01-25 DIAGNOSIS — E119 Type 2 diabetes mellitus without complications: Secondary | ICD-10-CM | POA: Diagnosis not present

## 2013-01-25 MED ORDER — GLUCOSE BLOOD VI STRP: ORAL_STRIP | Status: DC

## 2013-01-25 MED ORDER — BD LANCET ULTRAFINE 33G MISC: Status: DC

## 2013-01-25 NOTE — Assessment & Plan Note (Signed)
Patient's congestive heart failure has been well controlled. In her previous visit, her digoxin was taken off from her medicine list. After being off of digoxin, patient has been doing well. There is no signs of CHF exacerbation. In fact the patient has been doing well and feeling well. No fluid retention in the legs. Lungs clear to auscultation bilaterally.  -will continue Lasix, metoprolol and aspirin at the current dose.

## 2013-01-25 NOTE — Progress Notes (Signed)
Case discussed with Dr. Niu (at time of visit, soon after the resident saw the patient).  We reviewed the resident's history and exam and pertinent patient test results.  I agree with the assessment, diagnosis, and plan of care documented in the resident's note.   

## 2013-01-25 NOTE — Progress Notes (Signed)
Subjective:   Patient ID: Julie Elliott female   DOB: 1956/02/15 57 y.o.   MRN: [id number]  CC:  Follow up visit.             HPI:  [name] is a 57 y.o. lady with past medical history as outlined below, who presents for a followup visit today.    Patient's last 2-D echo on 11/18/05 showed EF 55-65%. Her CHF has been well controled. She did not have any symptoms, such as PND and orthopnea. She did not have fluid retention such as leg edema. She was taking Lasix 80 mg daily, aspirin, enalapril and metoprolol. She was also taking digoxin. In her previous visit, we took off her digoxin and asked her to come back today in order to make sure that she is doing well after being off digoxin. Today, patient feels normal. Patient does not have chest pain, shortness of breath, palpitation, cough or leg edema.  ROS: Denies fever, chills, fatigue, headaches,  cough, chest pain, SOB,  abdominal pain, diarrhea, constipation, dysuria, urgency, frequency, hematuria.    Past Medical History  Diagnosis Date  . Hypertension     well controlled  . DJD (degenerative joint disease)   . HLD (hyperlipidemia) 2008    stable, well controlled  . DM (diabetes mellitus) 2008    stable HgBA1C at 6.5  . GERD (gastroesophageal reflux disease)     well controlled on Omeprazole  . Chronic headaches    Current Outpatient Prescriptions  Medication Sig Dispense Refill  . aspirin 81 MG EC tablet Take 1 tablet (81 mg total) by mouth daily.  90 tablet  1  . B-D ULTRA-FINE 33 LANCETS MISC Use for checking blood sugar, 3 times per day. Diagnosis Code is 250.00  100 each  11  . Calcium-Vitamin D 600-125 MG-UNIT TABS Take 1 tablet by mouth daily.  90 each  1  . enalapril (VASOTEC) 20 MG tablet Take 1 tablet (20 mg total) by mouth 2 (two) times daily.  180 tablet  3  . furosemide (LASIX) 80 MG tablet Take 1 tablet (80 mg total) by mouth daily.  90 tablet  3  . glipiZIDE (GLUCOTROL) 10 MG tablet Take 1 tablet (10 mg total) by  mouth 2 (two) times daily before a meal.  240 tablet  3  . glucose blood (BAYER CONTOUR TEST) test strip [employer]. Use to test blood sugar 3 times a day.  100 each  11  . insulin glargine (LANTUS SOLOSTAR) 100 UNIT/ML injection Inject 30 Units into the skin 2 (two) times daily.  10 mL  11  . Insulin Pen Needle 32G X 4 MM MISC Use for insulin injection, bid. Diagnosis code is 250.00 for DM-II.  100 each  11  . metoprolol succinate (TOPROL-XL) 50 MG 24 hr tablet Take 1 tablet (50 mg total) by mouth daily.  90 tablet  3  . naproxen (NAPROSYN) 500 MG tablet Take 1 tablet (500 mg total) by mouth 2 (two) times daily.  180 tablet  3  . omeprazole (PRILOSEC) 20 MG capsule Take 1 capsule (20 mg total) by mouth 2 (two) times daily.  180 capsule  2  . potassium chloride SA (K-DUR,KLOR-CON) 20 MEQ tablet Take 1 tablet (20 mEq total) by mouth daily.  90 tablet  3  . simvastatin (ZOCOR) 40 MG tablet Take 1 tablet (40 mg total) by mouth daily.  90 tablet  3  . traMADol (ULTRAM) 50 MG tablet Take 1 tablet (50 mg  total) by mouth every 6 (six) hours as needed for pain.  20 tablet  0   No current facility-administered medications for this visit.   Family History  Problem Relation Age of Onset  . Diabetes Mother   . Heart disease Mother   . Prostate cancer Father    History   Social History  . Marital Status: Single    Spouse Name: N/A    Number of Children: N/A  . Years of Education: N/A   Social History Main Topics  . Smoking status: Current Every Day Smoker -- 0.30 packs/day    Types: Cigarettes  . Smokeless tobacco: None     Comment: Smokes 5-6 cigerettes per day  . Alcohol Use: None  . Drug Use: None  . Sexually Active: None   Other Topics Concern  . None   Social History Narrative   to get supplies nfrom [facility] per pt request.[phone]    Review of Systems: Full 14-point review of systems otherwise negative. See HPI.   Objective:  Physical Exam: Filed Vitals:   01/25/13  1518  BP: 110/80  Pulse: 80  Temp: 98.1 F (36.7 C)  TempSrc: Oral  Height: 5' 3.5" (1.613 m)  Weight: 268 lb 3.2 oz (121.655 kg)  SpO2: 100%   Physical Exam: There were no vitals filed for this visit. Constitutional: Vital signs reviewed.  Patient is a well-developed and well-nourished, in no acute distress and cooperative with exam.  HEENT:  Head: Normocephalic and atraumatic Ear: TM normal bilaterally Mouth: no erythema or exudates, MMM Eyes: PERRL, EOMI, conjunctivae normal, No scleral icterus.  Neck: Supple, Trachea midline normal ROM, No JVD, mass, thyromegaly, or carotid bruit present. No lymph node enlargement. Cardiovascular: RRR, S1 normal, S2 normal, no MRG, pulses symmetric and intact bilaterally Pulmonary/Chest: CTAB, no wheezes, rales, or rhonchi Abdominal: Soft. Non-tender, non-distended, bowel sounds are normal, no masses, organomegaly, or guarding present.   GU: no CVA tenderness Musculoskeletal: No joint deformities, erythema, or stiffness, ROM full and non-tender Hematology: no cervical, inginal, or axillary adenopathy.  Neurological: A&O x3, Strength is normal and symmetric bilaterally, cranial nerve II-XII are grossly intact, no focal motor deficit, sensory intact to light touch bilaterally.  Psychiatric: Normal mood and affect. speech and behavior is normal. Judgment and thought content normal. Cognition and memory are normal.    Assessment & Plan:

## 2013-01-25 NOTE — Patient Instructions (Signed)
1. You have done great job in taking all your medications. I appreciate it very much. Please continue doing that. 2. Please take all medications as prescribed.  3. If you have worsening of your symptoms or new symptoms arise, please call the clinic ([phone]), or go to the ER immediately if symptoms are severe.     

## 2013-02-10 ENCOUNTER — Other Ambulatory Visit: Payer: Self-pay

## 2013-04-23 ENCOUNTER — Inpatient Hospital Stay
Admit: 2013-04-23 | Discharge: 2013-04-26 | Disposition: A | Discharge status: Home Health | Payer: PRIVATE HEALTH INSURANCE | Attending: Internal Medicine | Admitting: Internal Medicine

## 2013-04-23 DIAGNOSIS — I6350 Cerebral infarction due to unspecified occlusion or stenosis of unspecified cerebral artery: Secondary | ICD-10-CM

## 2013-04-23 LAB — METABOLIC PANEL, BASIC
Anion gap: 4 mmol/L (ref 3.0–18)
BUN/Creatinine ratio: 15 (ref 12–20)
BUN: 10 MG/DL (ref 7.0–18)
CO2: 29 mmol/L (ref 21–32)
Calcium: 9.7 MG/DL (ref 8.5–10.1)
Chloride: 107 mmol/L (ref 100–108)
Creatinine: 0.66 MG/DL (ref 0.6–1.3)
GFR est AA: >60 mL/min/{1.73_m2} (ref >60)
GFR est non-AA: >60 mL/min/{1.73_m2} (ref >60)
Glucose: 153 mg/dL — ABNORMAL HIGH (ref 74–99)
Potassium: 3.9 mmol/L (ref 3.5–5.5)
Sodium: 140 mmol/L (ref 136–145)

## 2013-04-23 LAB — CBC WITH AUTOMATED DIFF
ABS. BASOPHILS: 0 10*3/uL (ref 0.0–0.06)
ABS. EOSINOPHILS: 0.1 10*3/uL (ref 0.0–0.4)
ABS. LYMPHOCYTES: 1.5 10*3/uL (ref 0.9–3.6)
ABS. MONOCYTES: 0.6 10*3/uL (ref 0.05–1.2)
ABS. NEUTROPHILS: 4.4 10*3/uL (ref 1.8–8.0)
BASOPHILS: 0 % (ref 0–2)
EOSINOPHILS: 2 % (ref 0–5)
HCT: 39.9 % (ref 35.0–45.0)
HGB: 13.9 g/dL (ref 12.0–16.0)
LYMPHOCYTES: 23 % (ref 21–52)
MCH: 27.4 PG (ref 24.0–34.0)
MCHC: 34.8 g/dL (ref 31.0–37.0)
MCV: 78.5 FL (ref 74.0–97.0)
MONOCYTES: 9 % (ref 3–10)
MPV: 9.6 FL (ref 9.2–11.8)
NEUTROPHILS: 66 % (ref 40–73)
PLATELET: 186 10*3/uL (ref 135–420)
RBC: 5.08 M/uL (ref 4.20–5.30)
RDW: 14 % (ref 11.6–14.5)
WBC: 6.6 10*3/uL (ref 4.6–13.2)

## 2013-04-23 LAB — PTT: aPTT: 27.5 s (ref 24.6–37.7)

## 2013-04-23 LAB — GLUCOSE, POC
Glucose (POC): 119 mg/dL — ABNORMAL HIGH (ref 70–110)
Glucose (POC): 232 mg/dL — ABNORMAL HIGH (ref 70–110)

## 2013-04-23 LAB — PROTHROMBIN TIME + INR
INR: 1 (ref 0.8–1.2)
Prothrombin time: 12.9 s (ref 11.5–15.2)

## 2013-04-23 LAB — TSH 3RD GENERATION: TSH: 1.14 u[IU]/mL (ref 0.40–5.00)

## 2013-04-23 NOTE — ED Provider Notes (Signed)
HPI Comments: [name] Short is a 57 y.o. female with a history of HTN, anemia, and diabetes, presenting to the ED c/o R-sided extremity weakness onset last night. Pt states that she came home from work yesterday and experienced pain in her L eye around 5 PM. She notes that she took a nap and felt stiff upon waking up around 8 PM. She denies trouble lifting her legs, but states, "when I was driving, I felt like I wasn't going as fast as normal." She states that her R arm is weak and that she fell out of her bed this morning on her R side. She also reports that a family member noted slurred speech yesterday. Pt takes Lisinopril for her HTN and states that she is compliant with her medications. She denies FHx of CVA. No other symptoms or complaints were presented at this time.       The history is provided by the patient and a relative.        Past Medical History   Diagnosis Date   ??? Diabetes    ??? Anemia         Past Surgical History   Procedure Laterality Date   ??? Hx cesarean section  1987   ??? Hx dilation and curettage  1995         Family History   Problem Relation Age of Onset   ??? Hypertension Other 35     parent,NOS   ??? Heart Disease Other 62     parent, NOS   ??? Diabetes Other 45     parent, NOS        History     Social History   ??? Marital Status: SINGLE     Spouse Name: N/A     Number of Children: N/A   ??? Years of Education: N/A     Occupational History   ??? Not on file.     Social History Main Topics   ??? Smoking status: Current Every Day Smoker   ??? Smokeless tobacco: Not on file   ??? Alcohol Use: No   ??? Drug Use: No   ??? Sexually Active:      Other Topics Concern   ??? Not on file     Social History Narrative   ??? No narrative on file                  ALLERGIES: Review of patient's allergies indicates no known allergies.      Review of Systems   Constitutional: Negative for fever.   HENT: Negative for neck pain and neck stiffness.    Eyes: Negative.  Negative for discharge.   Respiratory: Negative.  Negative for chest  tightness.    Cardiovascular: Negative.  Negative for chest pain.   Gastrointestinal: Negative.  Negative for nausea and abdominal pain.   Endocrine: Negative.    Genitourinary: Negative.  Negative for difficulty urinating.   Musculoskeletal: Negative.    Skin: Negative.    Allergic/Immunologic: Negative.    Neurological: Positive for speech difficulty, weakness (R-sided, R arm) and headaches. Negative for dizziness.   Hematological: Negative.    Psychiatric/Behavioral: Negative.  Negative for confusion and decreased concentration.   All other systems reviewed and are negative.        There were no vitals filed for this visit.         Physical Exam   Nursing note and vitals reviewed.  Constitutional: She is oriented to person, place, and time. She  appears well-developed and well-nourished.   HENT:   Head: Normocephalic and atraumatic.   Eyes: Conjunctivae are normal. Pupils are equal, round, and reactive to light.   Neck: Normal range of motion. Neck supple.   Cardiovascular: Normal rate and regular rhythm.    Pulmonary/Chest: Effort normal and breath sounds normal.   Abdominal: Soft. There is no tenderness.   Musculoskeletal: Normal range of motion.   Neurological: She is alert and oriented to person, place, and time.   R leg drift. Decreased strength in R upper extremity, L side unremarkable. Dysarthric speech. Appropriate conversations.   Skin: Skin is warm and dry. No rash noted.   Psychiatric: She has a normal mood and affect. Her behavior is normal.        MDM     Differential Diagnosis; Clinical Impression; Plan:     57 yo female presenting to the ED c/o R-sided weakness and dysarthria onset yesterday evening. Prior to current symptoms, pt c/o L-sided headache and L eye discomfort at work. Presents with dysarthric speech and R-sided weakness upon physical examination. Ordered CT Head and EKG. Admission anticipated. Symptoms began 24 hrs PTA, patient is not a thrombolytic candidate (tPa not given by protocol).      Differential Diagnosis:  Weakness, dysarthria, headache, CVA, RIND.  Amount and/or Complexity of Data Reviewed:   Clinical lab tests:  Ordered and reviewed   Obtain history from someone other than the patient:  Yes   Discuss the patient with another provider:  Yes  Risk of Significant Complications, Morbidity, and/or Mortality:   Presenting problems:  High  Diagnostic procedures:  High  Management options:  High  Critical Care:   Total time providing critical care:  30-74 minutes (Excluding all other billable procedures.)  Progress:   Patient progress:  Stable      Procedures    Medications ordered:   Medications - No data to display      Lab findings:  Labs Reviewed   METABOLIC PANEL, BASIC - Abnormal; Notable for the following:     Glucose 153 (*)     All other components within normal limits   GLUCOSE, POC - Abnormal; Notable for the following:     Glucose (POC) 119 (*)     All other components within normal limits   CBC WITH AUTOMATED DIFF   PTT   PROTHROMBIN TIME + INR   POC GLUCOSE       EKG interpretation:   Rate: 67 bpm. NSR with sinus arrythmia. No ST elevation or depression. 2:16 PM      X-Ray, CT or other radiology findings or impressions:  CT HEAD WO CONT    Final Result: Impression:         6 mm area of hypoattenuation in the left cerebellum on image 9. This is too    small to definitively characterize however this may represent a small subacute    or remote infarct. Further evaluation with MRI is recommended.           Progress notes, Consult notes or additional Procedure notes:   Consult:  Discussed care with Dr. Theodoro Grist, Hospitalist. Standard discussion; including history of patient???s chief complaint, available diagnostic results, and treatment course. Will admit the patient.   3:28 PM, 04/23/2013     Consult:  Discussed care with Dr. Lethea Killings, Neurology. Standard discussion; including history of patient???s chief complaint, available diagnostic results, and treatment course.     Exam unchanged; dysarthric  speech, R sided weakness. Symptoms  stable.  3:45 PM, 04/23/2013       Scribe Attestation Statement:   Curt Jews 04/23/2013 1:47 PM scribing for and in the presence of Dr.Barak Bialecki A Earlene Plater, MD     Curt Jews, Scribe      Provider Attestation:   I personally performed the services described in the documentation, reviewed the documentation, as recorded by the scribe in my presence, and it accurately and completely records my words and actions. 3:52 PM 04/23/2013 - Rhona Leavens, MD

## 2013-04-23 NOTE — Progress Notes (Signed)
TRANSFER - IN REPORT:    Verbal report received from [facility] RN(name) on [place]  being received from ED(unit) for routine progression of care      Report consisted of patient???s Situation, Background, Assessment and   Recommendations(SBAR).     Information from the following report(s) SBAR, Kardex and MAR was reviewed with the receiving nurse.    Opportunity for questions and clarification was provided.      Assessment completed upon patient???s arrival to unit and care assumed.

## 2013-04-23 NOTE — H&P (Signed)
[facility]                               [address] [place], [place] [address]                               HISTORY AND PHYSICAL    PATIENT:      [name] Short, [name] Short  MRN:             [id number]      DATE:   04/23/2013  BILLING:         [id number]   DOB:    05/17/1956  ROOM:         [id number]  REFERRING:    Cecile Hearing., MD  DICTATING:    Chauncey Reading, MD        CHIEF COMPLAINT: Slurring of speech, right-sided weakness.    HISTORY OF PRESENT ILLNESS: The patient is a 57 year old female. She has a  past medical history for diabetes, hypertension, dyslipidemia. She stated  that her symptoms began around noon yesterday. She was complaining of some  eye pain with associated headaches. There were no visual changes, including  any blurry vision or any double vision. She had actually placed and ice  pack over her left thigh. Her eye pain had resolved, as well as her  headache. She was then driving and she noticed that she was having  decreased sensation out of her right side. When she had returned home, her  family had noticed that she was having slurring of speech. This morning  when she had gotten out of bed, she had actually fallen, as she had  decreased strength out of the right side. According to their family  members, the strength has improved; however, she continues to have slurring  of speech. She was seen by [facility]; however, since her symptoms began about 24  hours ago, no TPA was considered. According to the patient, she already  takes an aspirin. She takes a statin. She takes 70/30 insulin, as well as  BYDUREON, which is an IM injection of long-acting insulin, every week.  Otherwise, she states that she has been doing well. There have been no  complaints of any fevers, weight gain or weight loss. She denies any chest  pain or shortness of breath, cough, abdominal pain. Denies any GI blood  loss, any history of syncope or any seizures.    PAST  MEDICAL HISTORY: Insulin-dependent diabetes, hypertension,  dyslipidemia.    MEDICATIONS: Includes  1. Aspirin 81 daily.  2. Lisinopril.  3. BYDUREON every week IM 70/30, 30 units in the morning, 25 at night.  4. Zocor.    ALLERGIES: NO KNOWN DRUG ALLERGIES.    SOCIAL HISTORY: Positive tobacco, positive for social alcohol.    FAMILY HISTORY: Strokes.    REVIEW OF SYSTEMS: See HPI. Otherwise, a 10-point review is negative.    PHYSICAL EXAMINATION  VITAL SIGNS: Temperature 97.8, pulse 75, BP 113-189 systolic, and then  56-85 diastolic. Respirations 16, 100% on room air.  GENERAL: She is comfortable. She is sitting up in bed.  HEENT: Normocephalic, atraumatic. Pupils equally round, react to light and  accommodation. Extraocular muscles intact. Oral mucosa is dry.  NECK:  Supple.  CHEST: Clear.  CARDIOVASCULAR: S1, S2.  ABDOMEN: Soft, obese. Bowel sounds present.  EXTREMITIES: Show no edema.  SKIN: Shows no breakdown.  NEUROLOGIC: Her cranial nerves 2-12 are grossly intact. Right upper   strength is 4+/5 on the right, 5/5 on the left. Gait was not assessed.    LABORATORY: White count is 6.6, hemoglobin 13.9, hematocrit 39.9, platelets  186. INR 1. Sodium 140, potassium 3.9, chloride 107, CO2 29, BUN and  creatinine 10 and 0.66. A CT scan of the head shows a 6-mm area of  hypoattenuation in the left cerebellum, too small to definitively  characterize; however, this may represent a small subacute or remote  infarct. EKG shows a rate of 67. No ischemic changes noted.    ASSESSMENT AND PLAN  1. Cerebrovascular accident.  2. Insulin-dependent diabetes.  3. Hypertension.  4. Morbid obesity.    PLAN: She will be admitted to [facility] under the acute ischemic stroke  orders, as she continues to have symptoms after 24 hours. She is already on  aspirin, I am going to go ahead and place her on Plavix as well.  Continuation of a statin, diabetic diet after she passes the bedside  swallow, sliding scale insulin  continuation of her  70/30. The family is  unaware of how much of the BYDUREON she actually takes. I do not think we  have it here. If she brings it in, I will place an order for nonformulary  medications to include BYDUREON every week. In addition, she will have an  MRI/MRA, a 2-D echo regarding CVA. She will be seen by Neurology, PT and OT  as well.                     Chauncey Reading, MD    MSC:wmx  D: 04/23/2013  05:22 P  T: 04/23/2013  06:50 P  Job: [id number]  CScriptDoc #: [id number]  cc:   Chauncey Reading, MD        Cecile Hearing., MD        Alcide Goodness, MD

## 2013-04-23 NOTE — ED Notes (Signed)
Per patient, "I think I had a stroke last night, my face started drooping and my right leg is heavy, I'm dragging it and I can't feel my foot."

## 2013-04-23 NOTE — ED Notes (Signed)
TRANSFER - OUT REPORT:    Verbal report given to Ellamae Sia, RN(name) on [place]  being transferred to [facility](unit) for routine progression of care       Report consisted of patient???s Situation, Background, Assessment and   Recommendations(SBAR).     Information from the following report(s) SBAR and ED Summary was reviewed with the receiving nurse.    Opportunity for questions and clarification was provided.

## 2013-04-24 LAB — LIPID PANEL
CHOL/HDL Ratio: 4.7 (ref 0–5.0)
Cholesterol, total: 178 MG/DL (ref <200)
HDL Cholesterol: 38 MG/DL — ABNORMAL LOW (ref 40–60)
LDL, calculated: 102 MG/DL — ABNORMAL HIGH (ref 0–100)
Triglyceride: 190 MG/DL — ABNORMAL HIGH (ref <150)
VLDL, calculated: 38 MG/DL

## 2013-04-24 LAB — EKG, 12 LEAD, INITIAL
Atrial Rate: 67 {beats}/min
Calculated P Axis: 56 degrees
Calculated R Axis: 10 degrees
Calculated T Axis: 44 degrees
Diagnosis: NORMAL
P-R Interval: 140 ms
Q-T Interval: 398 ms
QRS Duration: 90 ms
QTC Calculation (Bezet): 420 ms
Ventricular Rate: 67 {beats}/min

## 2013-04-24 LAB — HEMOGLOBIN A1C WITH EAG
Hemoglobin A1c: 7.3 % — ABNORMAL HIGH (ref 4.2–6.3)
Hemoglobin A1c: 7.3 % — ABNORMAL HIGH (ref 4.2–6.3)

## 2013-04-24 LAB — GLUCOSE, POC
Glucose (POC): 164 mg/dL — ABNORMAL HIGH (ref 70–110)
Glucose (POC): 176 mg/dL — ABNORMAL HIGH (ref 70–110)
Glucose (POC): 221 mg/dL — ABNORMAL HIGH (ref 70–110)
Glucose (POC): 106 mg/dL (ref 70–110)

## 2013-04-24 MED ADMIN — aspirin chewable tablet 81 mg: ORAL | @ 14:00:00 | NDC 63739043401

## 2013-04-24 MED ADMIN — famotidine (PEPCID) tablet 20 mg: ORAL | @ 02:00:00 | NDC 00172572880

## 2013-04-24 MED ADMIN — enoxaparin (LOVENOX) injection 40 mg: SUBCUTANEOUS | @ 02:00:00 | NDC 00781322402

## 2013-04-24 MED ADMIN — atorvastatin (LIPITOR) tablet 40 mg: ORAL | @ 02:00:00 | NDC 68084009911

## 2013-04-24 MED ADMIN — sodium chloride (NS) flush 5-10 mL: INTRAVENOUS | @ 04:00:00 | NDC 87701099893

## 2013-04-24 MED ADMIN — sodium chloride (NS) flush 5-10 mL: INTRAVENOUS | @ 02:00:00 | NDC 87701099893

## 2013-04-24 MED ADMIN — clopidogrel (PLAVIX) tablet 75 mg: ORAL | @ 14:00:00 | NDC 68084053611

## 2013-04-24 MED ADMIN — insulin lispro (HUMALOG) injection: SUBCUTANEOUS | @ 14:00:00 | NDC 00002751017

## 2013-04-24 MED ADMIN — sodium chloride (NS) flush 5-10 mL: INTRAVENOUS | @ 17:00:00 | NDC 87701099893

## 2013-04-24 MED ADMIN — sodium chloride (NS) flush 5-10 mL: INTRAVENOUS | @ 14:00:00 | NDC 87701099893

## 2013-04-24 MED ADMIN — insulin lispro (HUMALOG) injection: SUBCUTANEOUS | @ 03:00:00 | NDC 00002751017

## 2013-04-24 MED ADMIN — insulin aspart protamine/insulin aspart (NOVOLOG MIX 70/30) injection 30 Units: SUBCUTANEOUS | @ 14:00:00 | NDC 09999368501

## 2013-04-24 MED ADMIN — famotidine (PEPCID) tablet 20 mg: ORAL | @ 14:00:00 | NDC 00172572880

## 2013-04-24 MED ADMIN — insulin aspart protamine/insulin aspart (NOVOLOG MIX 70/30) injection 25 Units: SUBCUTANEOUS | @ 21:00:00 | NDC 09999368501

## 2013-04-24 MED ADMIN — insulin lispro (HUMALOG) injection: SUBCUTANEOUS | @ 17:00:00 | NDC 00002751017

## 2013-04-24 MED ADMIN — enoxaparin (LOVENOX) injection 40 mg: SUBCUTANEOUS | @ 21:00:00 | NDC 00781322402

## 2013-04-24 NOTE — Progress Notes (Addendum)
Primary Nurse Chancy Hurter, RN and Lawson Fiscal, RN performed a dual skin assessment on this patient No impairment noted  Braden score is 21    Stroke Education provided to patient and the following topics were discussed    1. Patients personal risk factors for stroke are hypertension, smoking, diabetes mellitus and obesity    2. Warning signs of Stroke:        * Sudden numbness or weakness of the face, arm or leg, especially on one side of          The body            * Sudden confusion, trouble speaking or understanding        * Sudden trouble seeing in one or both eyes        * Sudden trouble walking, dizziness, loss of balance or coordination        * Sudden severe headache with no known cause      3. Importance of activation Emergency Medical Services ( 9-1-1 ) immediately if experience any warning signs of stroke.    4. Be sure and schedule a follow-up appointment with your primary care doctor or any specialists as instructed.     5. You must take medicine every day to treat your risk factors for stroke.  Be sure to take your medicines exactly as your doctor tells you: no more, no less.  Know what your medicines are for , what they do.  Anti-thrombotics /anticoagulants can help prevent strokes.  You are taking the following medicine(s)  lipitor, lovenox     6.  Smoking and second-hand smoke greatly increase your risk of stroke, cardiovascular disease and death. Smoking history cigarettes, a few per day    7. Information provided was Stroke Handouts    8. Documentation of teaching completed in Patient Education Activity and on Care Plan with teaching response noted?  yes

## 2013-04-24 NOTE — Progress Notes (Signed)
5:55pm - Spoke with Jessica/RN about YES to claustrophobia. She didn't realize and in looking in chart, no meds ordered. She asked if she could hang meds for a pt and then call physician- Okay'd.  In the meantime, transport brought pt down without seeing nurse. Showed patient machine, explained process and how it would be an hour. Called Jessica back but didn't get to call physician. Pt wanted meds and Shanda Bumps agreed to have pt sent back up and either herself or shiftchange nurse will be calling dr. To get meds ordered and then have MRIs (triple) done. has

## 2013-04-24 NOTE — Other (Signed)
Bedside and Verbal shift change report given to JessicRN (oncoming nurse) by CIsoe-RN (offgoing nurse). Report included the following information SBAR, Kardex, MAR and Recent Results.

## 2013-04-24 NOTE — Progress Notes (Signed)
Problem: Self Care Deficits Care Plan (Adult)  Goal: *Acute Goals and Plan of Care (Insert Text)  Occupational Therapy Goals  Initiated 04/24/2013 within 7 day(s).    1. Patient will perform grooming with independence   2. Patient will perform bathing with modified independence.  3. Patient will perform upper body dressing with modified independence.  4. Patient will perform toilet transfers with modified independence.  5. Patient will perform all aspects of toileting with independence.  6. Patient will participate in upper extremity therapeutic exercise/activities with modified independence for 15 minutes.   OCCUPATIONAL THERAPY EVALUATION    Patient: [name] Short (57 y.o. female)  Date: 04/24/2013  Primary Diagnosis: CVA (cerebral vascular accident)  Stroke        Precautions:   Fall      ASSESSMENT :  Based on the objective data described below, the patient presents with RUE/LE hemiparesis and is a fall risk. Pt presents with decreased strength and coordination and decreased independence with ADls. Recommend home health occupational therapy.     Patient will benefit from skilled intervention to address the above impairments.  Patient???s rehabilitation potential is considered to be Excellent  Factors which may influence rehabilitation potential include:   [X]              None noted  [ ]              Mental ability/status  [ ]              Medical condition  [ ]              Home/family situation and support systems  [ ]              Safety awareness  [ ]              Pain tolerance/management  [ ]              Other:        PLAN :  Recommendations and Planned Interventions:  [X]                Self Care Training                  [ ]         Therapeutic Activities  [X]                Functional Mobility Training    [ ]         Cognitive Retraining  [X]                Therapeutic Exercises           [ ]         Endurance Activities  [X]                Balance Training                   [X]         Neuromuscular Re-Education  [ ]                 Visual/Perceptual Training     [X]    Home Safety Training  [ ]                Patient Education                 [X]         Family Training/Education  [ ]                Other (  comment):    Frequency/Duration: Patient will be followed by occupational therapy 3 times a week to address goals.  Discharge Recommendations: Home Health  Further Equipment Recommendations for Discharge: walker       SUBJECTIVE:   Patient stated ???I need to go to the bathroom???      OBJECTIVE DATA SUMMARY:       Past Medical History   Diagnosis Date   ??? Diabetes     ??? Anemia       Past Surgical History   Procedure Laterality Date   ??? Hx cesarean section   1987   ??? Hx dilation and curettage   1995     Prior Level of Function/Home Situation:   Home Situation  Home Environment: Private residence  # Steps to Enter: 3  One/Two Story Residence: One story  Living Alone: No  Support Systems: Family member(s)  Patient Expects to be Discharged to:: Private residence  Current DME Used/Available at Home: None  Tub or Shower Type: Tub/Shower combination  [X]   Right hand dominant          [ ]   Left hand dominant  Cognitive/Behavioral Status:  Neurologic State: Alert  Orientation Level: Oriented X4  Cognition: Appropriate decision making;Appropriate for age attention/concentration;Appropriate safety awareness  Safety/Judgement: Awareness of environment;Fall prevention    Vision/Perceptual:    Tracking: Able to track stimulus in all quadrants w/o difficulty         Coordination:  Coordination: Generally decreased, functional  Fine Motor Skills-Upper: Left Intact;Right Impaired    Gross Motor Skills-Upper: Left Intact;Right Impaired  Balance:  Sitting: Intact;Without support  Standing: Intact;With support  Strength:  Strength: Generally decreased, functional  RUE: 4/5 MMT  Tone & Sensation:  Tone: Abnormal  Sensation: Impaired    Range of Motion:  AROM: Within functional limits  PROM: Within functional limits  Functional Mobility and Transfers for ADLs:   Bed Mobility:  Rolling: Minimal  assistance  Supine to Sit: Minimal assistance  Sit to Supine: Minimum assistance  Scooting: Supervision  Transfers:  Sit to Stand: Minimum assistance     Bed to Chair: Minimum assistance                                Toilet Transfer : Minimum assistance              ADL Assessment:  Feeding: Supervision/set up    Oral Facial Hygiene/Grooming: Supervision/set up    Bathing: Minimum assistance    Upper Body Dressing: Supervision/set up    Lower Body Dressing: Minimum assistance    Toileting: Supervision/set up  Meal Preparation: Total assistance      Cognitive Retraining  Safety/Judgement: Awareness of environment;Fall prevention      Pain:  Pain Scale 1: Numeric (0 - 10)  Pain Intensity 1: 0     Activity Tolerance:   Please refer to the flowsheet for vital signs taken during this treatment.  After treatment:   [ ]  Patient left in no apparent distress sitting up in chair  [X]  Patient left in no apparent distress in bed  [X]  Call bell left within reach  [ ]  Nursing notified  [ ]  Caregiver present  [X]  Bed alarm activated      COMMUNICATION/EDUCATION:   [X]  Home safety education was provided and the patient/caregiver indicated understanding.  [ ]  Patient/family have participated as able in goal setting and plan of care.  [ ]   Patient/family agree to work toward stated goals and plan of care.  [ ]  Patient understands intent and goals of therapy, but is neutral about his/her participation.  [ ]  Patient is unable to participate in goal setting and plan of care.    Thank you for this referral.  Eliott Nine, OT

## 2013-04-24 NOTE — Progress Notes (Signed)
Educated patient on smoking cessation, not ready to quit now but will think about it.

## 2013-04-24 NOTE — Consults (Signed)
[name] Short is a 57 y.o., right handed female, who has a history of diabetes,  htn, high cholesterol who came to the hospital for stroke like symptoms which began the night prior to her coming into the hospital. Friday night she felt like she couldn't lift her right leg.  She also felt light headed, as if she was drunk while shopping at the [employer].  She said that earlier that day she had a sharp pain in the left eye and in her periorbital region.  She rarely if ever gets headaches.  She denies any other symptoms except for the left eye pain and the foot heaviness.  She thought it would go away, so she went home and went to bed.    When she woke up in the morning she noted the entire right side was weak and wouldn't hold her up.  She actually feel out of bed as she tried to get up.  She denies any language but there was some slurring of speech.  There were no vision changes, diplopia nor loss of vision.  She had weakness of both the arm and the leg on the right, no symptoms on the left.  She's never had a stroke before.  She does take an 81 mg aspirin daily.    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  She wears compression hose.  She also gets swelling of the right leg intermittently, so she continues to wear the hose.    Social history, lives with her mother, does smokes 1 cigar daily for the last year, prior the that was smoking 1 PPD for 40 years.  occasional drinker.    Family history diabetes, htn, glaucoma and heart diease.     MEDICATIONS PRIOR TO ADMISSION: Includes   1. Aspirin 81 daily.   2. Lisinopril.   3. BYDUREON every week IM 70/30, 30 units in the morning, 25 at night.   4. Zocor.         Current Facility-Administered Medications   Medication Dose Route Frequency Provider Last Rate Last Dose   ??? sodium chloride (NS) flush 5-10 mL  5-10 mL IntraVENous Q8H Chauncey Reading, MD   10 mL at 04/24/13 1324   ??? sodium chloride (NS) flush 5-10 mL  5-10 mL IntraVENous PRN Chauncey Reading, MD       ??? ondansetron  [facility]) injection 4 mg  4 mg IntraVENous Q6H PRN Chauncey Reading, MD       ??? clopidogrel (PLAVIX) tablet 75 mg  75 mg Oral DAILY Chauncey Reading, MD   75 mg at 04/24/13 0944   ??? atorvastatin (LIPITOR) tablet 40 mg  40 mg Oral QHS Chauncey Reading, MD   40 mg at 04/23/13 2204   ??? famotidine (PEPCID) tablet 20 mg  20 mg Oral Q12H Chauncey Reading, MD   20 mg at 04/24/13 6578   ??? enoxaparin (LOVENOX) injection 40 mg  40 mg SubCUTAneous Q24H Chauncey Reading, MD   40 mg at 04/23/13 2204   ??? insulin lispro (HUMALOG) injection   SubCUTAneous AC&HS Chauncey Reading, MD   4 Units at 04/24/13 1316   ??? glucose chewable tablet 16 g  16 g Oral PRN Chauncey Reading, MD       ??? glucagon (GLUCAGEN) injection 1 mg  1 mg IntraMUSCular PRN Chauncey Reading, MD       ??? dextrose (D50W) injection syrg 25 g  50 mL IntraVENous  PRN Chauncey Reading, MD       ??? insulin aspart protamine/insulin aspart (NOVOLOG MIX 70/30) injection 30 Units  30 Units SubCUTAneous 7am Chauncey Reading, MD   30 Units at 04/24/13 [phone]   ??? insulin aspart protamine/insulin aspart (NOVOLOG MIX 70/30) injection 25 Units  25 Units SubCUTAneous ACD Chauncey Reading, MD           Past Medical History   Diagnosis Date   ??? Diabetes    ??? Anemia        Past Surgical History   Procedure Laterality Date   ??? Hx cesarean section  1987   ??? Hx dilation and curettage  1995       No Known Allergies    Patient Active Problem List   Diagnosis Code   ??? Diabetes 250.00   ??? Anemia 285.9   ??? CVA (cerebral vascular accident) 434.91   ??? Stroke 434.91           Review of Systems:   As above otherwise 11 point review of systems negative including;   Constitutional no fever or chills  Skin denies rash or itching  HENT  Denies tinnitus, hearing lose  Eyes denies diplopia vision lose  Respiratory denies sortness of breath  Cardiovascular denies chest pain, dyspnea on exertion  Gastrointestinal denies nausea, vomiting, diarrhea, constipation  Genitourinary denies incontinence  Musculoskeletal denies joint pain or swelling   Endocrine denies weight change  Hematology denies easy bruising or bleeding   Neurological as above in HPI      PHYSICAL EXAMINATION:      VITAL SIGNS:  BP 151/76   Pulse 64   Temp(Src) 98.4 ??F (36.9 ??C)   Resp 20   Ht 5' (1.524 m)   Wt 88.451 kg (195 lb)   BMI 38.08 kg/m2   SpO2 99%   Breastfeeding? No    GENERAL: The patient is well developed, well nourished, and in no apparent distress.   EXTREMITIES: No clubbing, cyanosis, or edema is identified.  Pulses 2+ and symmetrical.  Muscle tone is normal.  HEAD:   Ear, nose, and throat appear to be without trauma.  The patient is normocephalic.    NEUROLOGIC EXAMINATION    MENTAL STATUS: The patient is awake, alert, and oriented x 3.  Speech is slurred but fluent and memory appears to be intact, both long and short term.    CRANIAL NERVES: II ??? Visual fields are full to confrontation. Funduscopic examination reveals flat disks bilaterally. Pupils are both 4 mm and briskly reactive to light and accommodation.   III, IV, VI ??? Extraocular movements are intact and there is no nystagmus.   V ??? Facial sensation is intact to pinprick and light touch.  VII ??? Face is symmetrical.   VIII - Hearing is present.   IX, X ??? Palate rises symmetrically. Gag is present. Tongue is in the midline.  MOTOR:  The patient is 4/5 throughout the entire right side with pronator drift and her fine finger movements are slower on the right.  Tone is normal.  Sensory examination is intact to pinprick, light touch and position sense testing.  Reflexes are decreased on the right compared to the left. Plantars are upgoing on the right. Cerebellar examination reveals no gross ataxia or dysmetria. Gait is right hemiparetic.            CBC:   Lab Results   Component Value Date/Time    WBC 6.6 04/23/2013  2:10  PM    RBC 5.08 04/23/2013  2:10 PM    HGB 13.9 04/23/2013  2:10 PM    HCT 39.9 04/23/2013  2:10 PM    PLATELET 186 04/23/2013  2:10 PM     BMP:   Lab Results   Component Value Date/Time    Glucose 153  04/23/2013  2:10 PM    Sodium 140 04/23/2013  2:10 PM    Potassium 3.9 04/23/2013  2:10 PM    Chloride 107 04/23/2013  2:10 PM    CO2 29 04/23/2013  2:10 PM    BUN 10 04/23/2013  2:10 PM    Creatinine 0.66 04/23/2013  2:10 PM    Calcium 9.7 04/23/2013  2:10 PM     CMP:   Lab Results   Component Value Date/Time    Glucose 153 04/23/2013  2:10 PM    Sodium 140 04/23/2013  2:10 PM    Potassium 3.9 04/23/2013  2:10 PM    Chloride 107 04/23/2013  2:10 PM    CO2 29 04/23/2013  2:10 PM    BUN 10 04/23/2013  2:10 PM    Creatinine 0.66 04/23/2013  2:10 PM    Calcium 9.7 04/23/2013  2:10 PM    Anion gap 4 04/23/2013  2:10 PM    BUN/Creatinine ratio 15 04/23/2013  2:10 PM    Alk. phosphatase 81 03/23/2009 10:39 AM    Protein, total 6.7 03/23/2009 10:39 AM    Albumin 3.4 03/23/2009 10:39 AM    Globulin 3.3 03/23/2009 10:39 AM    A-G Ratio 1.0 03/23/2009 10:39 AM     Coagulation:   Lab Results   Component Value Date/Time    Prothrombin time 12.9 04/23/2013  2:10 PM    INR 1.0 04/23/2013  2:10 PM    aPTT 27.5 04/23/2013  2:10 PM     Cardiac markers:   No results found for this basename: CPK, CKRMB, CKND1, TROIP, MYO      CT of the head without contrast   HISTORY: Stroke, weakness   COMPARISON: None   Technique: Multiple axial CT images of the head were obtained extending from the   skull base to the vertex.   FINDINGS:   There is no acute intracranial hemorrhage, shift of midline structures,   extraaxial fluid collections or mass effect. Ventricles and basal cisterns are   normal in size and appearance. The visualized osseous structures are   unremarkable. There is a 6 mm area of hypoattenuation in the left cerebellum on   image 9. This could represent a subacute or remote infarct.   Impression:   6 mm area of hypoattenuation in the left cerebellum on image 9. This is too   small to definitively characterize however this may represent a small subacute   or remote infarct. Further evaluation with MRI is recommended.       Impression: New onset right sided  weakness, face arm and leg about equal in this patient who has risk factors including htn, MD, hyperlipidemia, tobacco abuse.  This is likely a lacune infarct in the left subcortex, internal capsule or semicentrum ovale.  She was on low dose aspirin when this occurred.       Plan: Check MRI scanning.  Switch from ASA to plavix being an aspirin failure.  High dose statin.  Likely for out patient OT and PT.

## 2013-04-24 NOTE — Other (Signed)
Bedside, Verbal and Written shift change report given to Caroline Isoe, RN (oncoming nurse) by Jessica Pyles, RN (offgoing nurse). Report included the following information SBAR, Kardex, ED Summary, Procedure Summary, Intake/Output, MAR and Recent Results.

## 2013-04-24 NOTE — Progress Notes (Signed)
Hospitalist Progress Note    Patient: [name] Short MRN: [id number]  CSN: [id number]    Date of Birth: 1956-07-18  Age: 57 y.o.  Sex: female    DOA: 04/23/2013 LOS:  LOS: 1 day          Feels better than yesterday but speech and right sided weakness not at baseline. Denies ha, cp, sob. Appetite good. Was on baby asa at home.     She states she has had diabetes education in the past.    Assessment/Plan     Active Problems:    CVA (cerebral vascular accident) (04/23/2013)      Stroke (04/23/2013)      1. Acute cerebral infarction - change to plavix pending input from neurology. Permissive hypertension. Glucose and temperature control. 4S. Await st, pt, and ot. Hopefully MRI/MRAs today.  2. DM2 fairly well controlled with A1c 7.3. On 70/30, ssi and ADA diet. Diabetes education.   3. Dyslipidemia on statin.   4. Anemia mc hc - check studies.   5. Hypertension - permissive hypertension - see #1.  6. Tobacco abuse - precontemplative. Smoking cessation education.  7. dvt prophylaxis  8. Full code. 4s. Await diagnostic workup.    Additional Notes:      Case discussed with:  [x] Patient  [] Family  [x] Nursing  [] Case Management  DVT Prophylaxis:  [x] Lovenox  [] Hep SQ  [] SCDs  [] Coumadin   [] On Heparin gtt    Vital signs/Intake and Output:  Visit Vitals   Item Reading   ??? BP 144/77   ??? Pulse 65   ??? Temp 97.8 ??F (36.6 ??C)   ??? Resp 20   ??? Ht 5' (1.524 m)   ??? Wt 88.451 kg (195 lb)   ??? BMI 38.08 kg/m2   ??? SpO2 99%   ??? Breastfeeding No     Current Shift:  09/21 0700 - 09/21 1859  In: 240 [P.O.:240]  Out: -   Last three shifts:       Awake alert and oriented. Speech slightly slurred  Perrl. Eomi. Oropharynx clear  Neck supple  RRR  Lungs cta b.l  abd obese s nt nd nabs  Ext no edema. dp 2+ b.l  Neuro right sided weakness, slurred speech.  No rash    Medications Reviewed      Labs: Results:       Chemistry Recent Labs      04/23/13   1410   GLU  153*   NA  140   K  3.9   CL  107   CO2  29   BUN  10   CREA  0.66   CA  9.7   AGAP  4   BUCR   15      CBC w/Diff Recent Labs      04/23/13   1410   WBC  6.6   RBC  5.08   HGB  13.9   HCT  39.9   PLT  186   GRANS  66   LYMPH  23   EOS  2      Cardiac Enzymes No results found for this basename: CPK, CKRMB, CKND1, TROIP, MYO,  in the last 72 hours   Coagulation Recent Labs      04/23/13   1410   PTP  12.9   INR  1.0   APTT  27.5       Lipid Panel Lab Results   Component Value Date/Time    Cholesterol, total  178 04/24/2013  6:50 AM    HDL Cholesterol 38 04/24/2013  6:50 AM    LDL, calculated 102 04/24/2013  6:50 AM    VLDL, calculated 38 04/24/2013  6:50 AM    Triglyceride 190 04/24/2013  6:50 AM    CHOL/HDL Ratio 4.7 04/24/2013  6:50 AM      BNP No results found for this basename: BNPP,  in the last 72 hours   Liver Enzymes No results found for this basename: TP, ALB, TBIL, AP, SGOT, GPT, DBIL,  in the last 72 hours   Thyroid Studies Lab Results   Component Value Date/Time    TSH 1.14 04/23/2013  2:16 PM        Procedures/imaging: see electronic medical records for all procedures/Xrays and details which were not copied into this note but were reviewed prior to creation of Plan

## 2013-04-25 LAB — GLUCOSE, POC
Glucose (POC): 76 mg/dL (ref 70–110)
Glucose (POC): 108 mg/dL (ref 70–110)
Glucose (POC): 185 mg/dL — ABNORMAL HIGH (ref 70–110)
Glucose (POC): 125 mg/dL — ABNORMAL HIGH (ref 70–110)

## 2013-04-25 LAB — METABOLIC PANEL, BASIC
Anion gap: 7 mmol/L (ref 3.0–18)
BUN/Creatinine ratio: 23 — ABNORMAL HIGH (ref 12–20)
BUN: 12 MG/DL (ref 7.0–18)
CO2: 28 mmol/L (ref 21–32)
Calcium: 9.6 MG/DL (ref 8.5–10.1)
Chloride: 109 mmol/L — ABNORMAL HIGH (ref 100–108)
Creatinine: 0.53 MG/DL — ABNORMAL LOW (ref 0.6–1.3)
GFR est AA: >60 mL/min/{1.73_m2} (ref >60)
GFR est non-AA: >60 mL/min/{1.73_m2} (ref >60)
Glucose: 89 mg/dL (ref 74–99)
Potassium: 3.7 mmol/L (ref 3.5–5.5)
Sodium: 144 mmol/L (ref 136–145)

## 2013-04-25 LAB — CBC WITH AUTOMATED DIFF
ABS. BASOPHILS: 0 10*3/uL (ref 0.0–0.1)
ABS. EOSINOPHILS: 0.1 10*3/uL (ref 0.0–0.4)
ABS. LYMPHOCYTES: 1.3 10*3/uL (ref 0.9–3.6)
ABS. MONOCYTES: 0.4 10*3/uL (ref 0.05–1.2)
ABS. NEUTROPHILS: 2.7 10*3/uL (ref 1.8–8.0)
BASOPHILS: 0 % (ref 0–2)
EOSINOPHILS: 2 % (ref 0–5)
HCT: 36.5 % (ref 35.0–45.0)
HGB: 12.5 g/dL (ref 12.0–16.0)
LYMPHOCYTES: 29 % (ref 21–52)
MCH: 27 PG (ref 24.0–34.0)
MCHC: 34.2 g/dL (ref 31.0–37.0)
MCV: 78.8 FL (ref 74.0–97.0)
MONOCYTES: 9 % (ref 3–10)
MPV: 10 FL (ref 9.2–11.8)
NEUTROPHILS: 60 % (ref 40–73)
PLATELET: 190 10*3/uL (ref 135–420)
RBC: 4.63 M/uL (ref 4.20–5.30)
RDW: 14.1 % (ref 11.6–14.5)
WBC: 4.5 10*3/uL — ABNORMAL LOW (ref 4.6–13.2)

## 2013-04-25 LAB — FERRITIN: Ferritin: 52 NG/ML (ref 8–388)

## 2013-04-25 LAB — IRON PROFILE
Iron % saturation: 18 %
Iron: 55 ug/dL (ref 50–175)
TIBC: 300 ug/dL (ref 250–450)

## 2013-04-25 LAB — VITAMIN B12 & FOLATE
Folate: 13 ng/mL (ref 5.38–24.0)
Vitamin B12: 463 pg/mL (ref 211–911)

## 2013-04-25 LAB — MAGNESIUM: Magnesium: 1.3 mg/dL — ABNORMAL LOW (ref 1.8–2.4)

## 2013-04-25 MED ADMIN — lorazepam (ATIVAN) injection 1 mg: INTRAVENOUS | @ 01:00:00 | NDC 00641604401

## 2013-04-25 MED ADMIN — famotidine (PEPCID) tablet 20 mg: ORAL | @ 02:00:00 | NDC 00172572880

## 2013-04-25 MED ADMIN — insulin aspart protamine/insulin aspart (NOVOLOG MIX 70/30) injection 25 Units: SUBCUTANEOUS | @ 21:00:00 | NDC 09999368501

## 2013-04-25 MED ADMIN — famotidine (PEPCID) tablet 20 mg: ORAL | @ 13:00:00 | NDC 00172572880

## 2013-04-25 MED ADMIN — enoxaparin (LOVENOX) injection 40 mg: SUBCUTANEOUS | @ 21:00:00 | NDC 00781322402

## 2013-04-25 MED ADMIN — insulin aspart protamine/insulin aspart (NOVOLOG MIX 70/30) injection 30 Units: SUBCUTANEOUS | @ 13:00:00 | NDC 09999368501

## 2013-04-25 MED ADMIN — atorvastatin (LIPITOR) tablet 40 mg: ORAL | @ 02:00:00 | NDC 68084009911

## 2013-04-25 MED ADMIN — sodium chloride (NS) flush 5-10 mL: INTRAVENOUS | @ 10:00:00 | NDC 87701099893

## 2013-04-25 MED ADMIN — gadopentetate dimeglumine (MAGNEVIST) 10 mmol/20 mL (469.01 mg/mL) contrast solution 20 mL: INTRAVENOUS | @ 02:00:00 | NDC 50419018802

## 2013-04-25 MED ADMIN — insulin lispro (HUMALOG) injection: SUBCUTANEOUS | @ 17:00:00 | NDC 00002751017

## 2013-04-25 MED ADMIN — sodium chloride (NS) flush 5-10 mL: INTRAVENOUS | @ 21:00:00 | NDC 87701099893

## 2013-04-25 MED ADMIN — sodium chloride (NS) flush 5-10 mL: INTRAVENOUS | @ 02:00:00 | NDC 87701099893

## 2013-04-25 MED ADMIN — magnesium sulfate 3 g in 0.9% sodium chloride 100 mL IVPB: INTRAVENOUS | @ 21:00:00 | NDC 63323006402

## 2013-04-25 MED ADMIN — clopidogrel (PLAVIX) tablet 75 mg: ORAL | @ 13:00:00 | NDC 68084053611

## 2013-04-25 NOTE — Other (Signed)
Diabetes Patient/Family Education Record  Obtained permission from patient to enter her hospital room for assessment of home diabetes management and to help identify educational needs.  Patient alert and oriented x3. Speech clear and appropriate response.    FACTORS THAT MAY INFLUENCE PATIENTS ABILITY TO LEARN OR COMPLY WITH RECOMMENDATIONS:   []    Language barrier    []    Cultural needs   []    Motivation    []    Cognitive limitation    [x]    Physical: patient is currently experiencing weakness of right arm and leg weakness due to stroke.   []    Education    []    Physiological factors   []    Hearing/vision/speaking impairment   []    Religious beliefs    []    Financial factors   []   Other:   [x]   No factors identified at this time: patient stated that she has been diabetic for many years and is also a caregiver for her mother who is also diabetic who lives with her. Patient stated that her adult daughter recently moved in with her. 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     Person Instructed:   [x]    Patient   []    Family   []   Other     Preference for Learning:   [x]    Verbal: patient stated that she already have written diabetes educational materials at home.   []    Written   []   Demonstration     Level of Comprehension & Competence:    [x]   Good                                      []  Fair                                     []   Poor                             []   Needs Reinforcement       Education Component:   [x]   Medication management, including how to administer insulin (when appropriate) and potential medication interactions: Home diabetes medications PTA: Novolog 70/30 pen (30 units every morning and 25 units daily at night) and Bydureon IM injection weekly. Patient reported that she recently ran out of Bydureon therefore she changed date of self injection from Sundays to Mondays. Discussed with patient the importance of taking the right amount of prescribed diabetes  medications at the same time daily for her Novolog 70/30 and maintaining the same day of Bydureon injection weekly. She verbalized understanding. Also encouraged patient to obtain refill ahead of time.   [x]   Nutritional management, including the role of carbohydrate intake in blood glucose management: discussed the importance of eating at the same time each day and the same amount, encouraged her to continue maintain portion control after she stated that she does not always follow the recommended diabetes nutrition. Patient stated that she attended diabetes class a few years ago. I informed her about [facility] free outpatient diabetes class which is held on the first four Thursdays of every month from 1-3 PM and encouraged her to attend. She expressed interest and accepted the flyer. Encouraged her to call and register  to attend the diabetes class, and extend the invitation to members of her family to come.    [x]   Exercise: Patient stated that she was doing walking exercise most days of the week prior to her hospital admission.  Discussed with patient that she will be seen by the hospital physical therapist for mobility evaluation and recommendation. Encouraged patient to follow recommendation for safety to help prevent falls or injury due to current weakness on her right side.    [x]   Signs, symptoms, and treatment of hyperglycemia and hypoglycemia: reviewed the signs and symptoms of both low and high blood sugar, and the importance of contacting her doctor as soon as possible for persistent low or high blood sugar readings. Discussed with patient that persistent low or high blood sugar readings if left untreated may cause serious medical complications. Reviewed the hypoglycemic action with patient and confirmed that she takes 15 grams of carbohydrates when she experience low blood glucose < 70 mg/dL but need to re check her sugar within 15 minutes. Repeat and take another 15 grams of carbs and re check blood  sugar within 15 minutes again. Call 911 after 30 minutes unsuccessful attempts to bring her blood sugar above 70 mg/dL.   [x]   Target blood glucose levels: reviewed with patient knowledge that fasting blood glucose (before meal) should be 70-130 mg/dL; and it should be < 161 mg/dL two hours after a meal.   [x]   Importance of blood glucose monitoring and how to obtain a blood glucose meter: patient stated that she checks her blood sugar at least two times daily at home.   []   Instruction on use of meter   [x]   Discuss and provide patient with HbA1C results: discussed with patient that her current A1C is 7.3% which is equivalent to average blood glucose reading of 162 mg/L within the past 2-3 months. Informed patient that the recommended A1C is 6% which is equivalent to average blood glucose reading of 126 mg/dL within 2-3 months.   [x]   Sick day guidelines: encouarged patient to follow her docotr's sick day guidelines and to make sure about the importance of not skipping her diabetes medications unless specifically instructed by her doctor. Reviewed with patient that blood glucose readings may increase during times of illness or sickness.   [x]   Health complications/consequences of non-compliance: discussed stroke with patient, and uncontrolled diabetes may damage vision, heart, kidney, and nerve.   [x]   Contact Information Provided: patient accepted my business card and encouraged her to call when she needs assistance with diabetes education. She agreed.    Plan for post-discharge education or self-management support:    [x]  Outpatient class schedule provided: Iinformed patient about [facility] free outpatient diabetes class which is held on the first four Thursdays of every month from 1-3 PM and encouraged her to attend. She expressed interest and accepted the flyer. Encouraged her to call and register to attend the diabetes class, and extend the invitation to members of her family to come.        []  Patient  Declined    []  Scheduled for outpatient classes (date) _______         Watt Climes, RN CCM  10:34 AM

## 2013-04-25 NOTE — Progress Notes (Signed)
Received HC order.  FOC given to pt, list of agencies given and the pt chose [facility].   Notified agency re: HC referral.  [facility]  [phone]

## 2013-04-25 NOTE — Progress Notes (Signed)
Chaplain visited during floor rounds. Listened as Pt expressed gratitude and relief about medical situation.  Pt expressed gratitude for spiritual supports.      Hassan Rowan  Chaplain Resident  Spiritual Care  [phone]

## 2013-04-25 NOTE — Other (Signed)
Bedside, Verbal and Written shift change report given to Su Ley, Charity fundraiser (oncoming nurse) by Roxanne Gates, RN (offgoing nurse). Report included the following information SBAR, Kardex, ED Summary, Procedure Summary, Intake/Output, MAR and Recent Results.

## 2013-04-25 NOTE — Progress Notes (Signed)
Pt is off the unit and unavailable for interview. Sw will re-attempt at a later time.

## 2013-04-25 NOTE — Progress Notes (Signed)
Problem: Dysphagia (Adult)  Goal: *Acute Goals and Plan of Care (Insert Text)    Dysphagia eval completed. Pt A&Ox4, reports dysarthria not improving, denies dysphagia. + appetite during am meal. SLP noted intermittent evidence of possible aspiration, with delayed throat clearing, hyponasality, rhinorrhea, etc; however, pt reports these symptoms are associated with post nasal drip and allergies that frequently occur during this time of year. SLP discussed aspiration risk with CVA and importance of alerting RN if s/sx aspiration are identified. Mild oropharyngeal dysphagia evident during am meal c/b labored oral prep phase, oral residue s/p swallow and decreased hyolaryngeal excursion. Increased oral residue observed with regular solids due to poor dentition and decreased lingual/labial ROM, rec modify die to mech-soft consistency accordingly. SLP educated pt with regard to compensatory swallow strategies and aspiration/reflux precautions, including small bites/sips, alternate liquids/solids, decrease feeding rate, HOB > 45 with all po, and upright in bed at 30 degrees after po for at least 45 minutes. Rec initiate mechanical soft diet with thin liquids. Further rec oral care TID, aspiration precautions, and meds whole, one at a time. Pt would benefit from MBS to r/o aspiration and assess swallow integrity.    REC:   Mech-soft diet; thin liquids  HOB >45 for po intake, remain >30 for 30-45 minutes after po   Small bites/sips; alternate liquid/solid with slow feeding rate   Verbal cues to swallow twice per bite/sip  Oral care TID  Meds whole, one @ a time    Patient will:  Utilize compensatory swallow maneuvers (decrease bolus size, decrease rate of intake, cyclical ingestion, etc.) to increase swallow function/safety with min visual, verbal and/or tactile cues in 4/5 trials.     Tolerate least-restrictive diet utilizing compensatory swallow techniques (decrease bolus size, decrease rate of intake, cyclical ingestion,  etc.) without overt s/sx aspiration/distress in 4/5 trials with min visual, verbal, and/or tactile cues    Perform restorative oropharyngeal exercises and swallow maneuvers (supraglottic swallow, Mendelson maneuver, effortful swallow, OMEs etc.) to increase oropharyngeal strength/awareness/agility, tongue base retraction, hyolaryngeal excursion, airway protection, and/or bolus clearance with min visual, verbal and/or tactile cues in 4/5 trials.    Demonstrate the ability to adequately self-monitor swallowing skills and perform   appropriate compensatory techniques to reduce s/sx of aspiration and to safely consume least-restrictive diet with min verbal, visual and/or tactile cues in 4/5 trials.  SPEECH LANGUAGE PATHOLOGY BEDSIDE SWALLOW EVALUATION    Patient: [name] Short (57 y.o. female)  Date: 04/25/2013  Primary Diagnosis: CVA (cerebral vascular accident)  Stroke        Precautions: aspiration;  Fall      ASSESSMENT :  Based on the objective data described below, the patient presents with mild-mod oropharyngeal dysphagia.    Patient will benefit from skilled intervention to address the above impairments.  Patient???s rehabilitation potential is considered to be Fair  Factors which may influence rehabilitation potential include:   [X]             None noted  [ ]             Mental ability/status  [ ]             Medical condition  [ ]             Home/family situation and support systems  [ ]             Safety awareness  [ ]             Pain tolerance/management  [ ]   Other:        PLAN :  Recommendations and Planned Interventions:  As above  Frequency/Duration: Patient will be followed by speech-language pathology 3 times a week to address goals.  Discharge Recommendations: Home Health and Outpatient       SUBJECTIVE:   Patient stated ???I have allergies during this time of year???      OBJECTIVE:       Past Medical History   Diagnosis Date   ??? Diabetes     ??? Anemia       Past Surgical History   Procedure Laterality  Date   ??? Hx cesarean section   1987   ??? Hx dilation and curettage   1995     Prior Level of Function/Home Situation: Mod I, cane   Home Situation  Home Environment: Private residence  # Steps to Enter: 3  One/Two Story Residence: One story  Living Alone: No  Support Systems: Family member(s)  Patient Expects to be Discharged to:: Private residence  Current DME Used/Available at Home: None  Tub or Shower Type: Tub/Shower combination  Diet prior to admission: Regular  Current Diet:  Mech soft     Cognitive and Communication Status:  Neurologic State: Alert  Orientation Level: Oriented X4  Cognition: Follows commands  Perception: Appears intact  Perseveration: No perseveration noted  Safety/Judgement: Awareness of environment;Fall prevention  Oral Assessment:  Oral Assessment  Labial: Decreased rate;Decreased seal;Impaired coordination  Dentition: Limited  Lingual: Decreased rate;Decreased strength;Incoordinated  Velum: No impairment  Mandible: No impairment  P.O. Trials:  Patient Position: EOB  Vocal quality prior to P.O.:  (Hyponasal)  Consistency Presented: Thin liquid;Solid;Mechanical soft  How Presented: Self-fed/presented;Straw;Successive swallows     Bolus Acceptance: No impairment  Bolus Formation/Control: Impaired  Type of Impairment: Poor;Mastication  Propulsion: Delayed (# of seconds)  Oral Residue: Less than 10% of bolus;Lingual  Initiation of Swallow: Delayed (# of seconds)  Laryngeal Elevation: Decreased  Aspiration Signs/Symptoms: Change vocal quality;Clear throat;Runny nose;Watery eyes  Pharyngeal Phase Characteristics: Double swallow  Effective Modifications: Alternate liquids/solids;Small sips and bites  Cues for Modifications: Minimal       Oral Phase Severity: Mild  Pharyngeal Phase Severity : Mild  Pain:  Pain Scale 1: Numeric (0 - 10)  Pain Intensity 1: 0     After treatment:   [ ]             Patient left in no apparent distress sitting up in chair  [X]             Patient left in no apparent  distress in bed  [ ]             Call bell left within reach  [ ]             Nursing notified  [ ]             Caregiver present  [ ]             Bed alarm activated      COMMUNICATION/EDUCATION:   [ ]             Posted safety precautions in patient's room.  [X]             Patient/family have participated as able in goal setting and plan of care.  [ ]             Patient/family agree to work toward stated goals and plan of care.  [ ]   Patient understands intent and goals of therapy, but is neutral about his/her participation.  [ ]             Patient is unable to participate in goal setting and plan of care.    Thank you for this referral.    Fuller Canada, M.S., CCC-SLP  Pager: [phone]

## 2013-04-25 NOTE — Progress Notes (Signed)
Patient given discharge instructions with opportunity for questions and answers.  Tele box remove, IV removed, prescriptions given with instructions and opportunity for questions and answers.  Daughter at bedside for information.  Patient and daughter gathering belongings.

## 2013-04-25 NOTE — Other (Signed)
Bedside and Verbal shift change report given to Jessica-rn (Cabin crew) by cisoe-rn (offgoing nurse). Report included the following information SBAR, Kardex, MAR and Recent Results.

## 2013-04-25 NOTE — Progress Notes (Signed)
Patient was adminisered Magnesium at 33.90ml/hr, 3 grams of magnesium to go over 3 hours.  Medication was given late due to patient being off the floor for a test.  Dr. Garret Reddish wanted this nurse to verify how fast we can run Magnesium with pharmacy.  Pharmacy states it can possibly be ran over 90 minutes, with possible risks.  This nurse bumped up magnesium to 58ml/hr.  Patient has tolerated this rate for approximately 30 minutes.

## 2013-04-25 NOTE — Other (Signed)
GLYCEMIC CONTROL     Assessment / Recommendations:  Noted per H&P patient with history of DM type II was admitted on 04/23/2013 with with slurred speech and right sided weakness. Neurology was consulted, impression likely lacune infarct.  Blood glucose readings are currently above recommended target range of 70-140 mgd/dL.  Recommend to continue inpatient blood glucose monitoring and intervention. Consider changing Aspart 70/30 insulin to preferred daily lantus insulin while inpatient: 8 units.    Most recent blood glucose values:  FBS 89 mg/dL  POC blood glucose readings on 04/24/2013: 176, 221, 106, 76 mg/dL  POC blood glucose reading on 04/25/2013 at time of review: 108 mg/dL    Current Z6X of 0.9% (04/24/2013) equivalent to average blood glucose of 162 mg/dl over past 2-3 months.    Current hospital diabetes medications:  Aspart 70/30 insulin 30 units daily daily every morning (30 units AM and 25 units before dinner)  Corrective lispro insulin 4 times daily before meals and nightly.    Previous day's insulin requirements: 04/24/2013  Aspart 70/30 insulin: 55 units   Lispro insulin: 6 units  TDD: 61 units of insulin    Home diabetes medications:  Novolog 70/30 insulin: 30 units in the morning and 25 units at night.  Bydureon weekly.    Diet: Cardiac with consistent carb    Education:  _X__ Refer to Diabetes Educations Record: Plan to see patient to assess home diabetes management and identify educational needs.                     ___ Education not indicated at this time      Sharyne Richters, RN CCM

## 2013-04-25 NOTE — Progress Notes (Signed)
Re:  [name] Short,Follow up visit     04/25/2013 11:57 AM    SSN: [id number]    Subjective:   [name] Short is seen resting in bed without any complaints.    Medications:    Current Facility-Administered Medications   Medication Dose Route Frequency Provider Last Rate Last Dose   ??? [COMPLETED] lorazepam (ATIVAN) injection 1 mg  1 mg IntraVENous ONCE Olin Pia, MD   1 mg at 04/24/13 2032   ??? [COMPLETED] gadopentetate dimeglumine (MAGNEVIST) 10 mmol/20 mL (469.01 mg/mL) contrast solution 20 mL  20 mL IntraVENous RAD ONCE Chauncey Reading, MD   20 mL at 04/24/13 2218   ??? sodium chloride (NS) flush 5-10 mL  5-10 mL IntraVENous Q8H Chauncey Reading, MD   10 mL at 04/25/13 0603   ??? sodium chloride (NS) flush 5-10 mL  5-10 mL IntraVENous PRN Chauncey Reading, MD       ??? ondansetron [facility]) injection 4 mg  4 mg IntraVENous Q6H PRN Chauncey Reading, MD       ??? clopidogrel (PLAVIX) tablet 75 mg  75 mg Oral DAILY Chauncey Reading, MD   75 mg at 04/25/13 4098   ??? atorvastatin (LIPITOR) tablet 40 mg  40 mg Oral QHS Chauncey Reading, MD   40 mg at 04/24/13 2219   ??? famotidine (PEPCID) tablet 20 mg  20 mg Oral Q12H Chauncey Reading, MD   20 mg at 04/25/13 1191   ??? enoxaparin (LOVENOX) injection 40 mg  40 mg SubCUTAneous Q24H Chauncey Reading, MD   40 mg at 04/24/13 1725   ??? insulin lispro (HUMALOG) injection   SubCUTAneous AC&HS Chauncey Reading, MD   4 Units at 04/24/13 1316   ??? glucose chewable tablet 16 g  16 g Oral PRN Chauncey Reading, MD       ??? glucagon (GLUCAGEN) injection 1 mg  1 mg IntraMUSCular PRN Chauncey Reading, MD       ??? dextrose (D50W) injection syrg 25 g  50 mL IntraVENous PRN Chauncey Reading, MD       ??? insulin aspart protamine/insulin aspart (NOVOLOG MIX 70/30) injection 30 Units  30 Units SubCUTAneous 7am Chauncey Reading, MD   30 Units at 04/25/13 4782   ??? insulin aspart protamine/insulin aspart (NOVOLOG MIX 70/30) injection 25 Units  25 Units SubCUTAneous ACD Chauncey Reading, MD   25 Units at 04/24/13 1724       Vital signs:  BP 143/70    Pulse 83   Temp(Src) 98.1 ??F (36.7 ??C)   Resp 20   Ht 5' (1.524 m)   Wt 84.9 kg (187 lb 2.7 oz)   BMI 36.55 kg/m2   SpO2 100%   Breastfeeding? No    Review of Systems:   As above otherwise 11 point review of systems negative including;   Constitutional no fever or chills  Skin denies rash or itching  HENT  Denies tinnitus, hearing lose  Eyes denies diplopia vision lose  Respiratory denies sortness of breath  Cardiovascular denies chest pain, dyspnea on exertion  Gastrointestinal denies nausea, vomiting, diarrhea, constipation  Genitourinary denies incontinence  Musculoskeletal denies joint pain or swelling  Endocrine denies weight change  Hematology denies easy bruising or bleeding   Neurological as above in HPI      Patient Active Problem List   Diagnosis Code   ??? Diabetes 250.00   ??? Anemia 285.9   ??? CVA (cerebral vascular  accident) 434.91   ??? Stroke 434.91         Objective: The patient is awake, alert, and oriented x 3.  Speech is fluent and memory is intact.  Cranial Nerves: II ??? Visual fields are full to confrontation.  III, IV, VI ??? Extraocular movements are intact. There is no nystagmus. V ??? Facial sensation is intact to pinprick.  VII ??? Face is symmetrical.  VIII - Hearing is present.  IX, X, XII ??? Palate is symmetrical.   Motor:  The patient has a mild right hemiparesis involving the arm and the leg. Tone is normal. Reflexes are 2+ and symmetrical. Plantars are down going. Gait is mildly right hemi paretic.    CBC:   Lab Results   Component Value Date/Time    WBC 4.5 04/25/2013  3:16 AM    RBC 4.63 04/25/2013  3:16 AM    HGB 12.5 04/25/2013  3:16 AM    HCT 36.5 04/25/2013  3:16 AM    PLATELET 190 04/25/2013  3:16 AM     BMP:   Lab Results   Component Value Date/Time    Glucose 89 04/25/2013  3:16 AM    Sodium 144 04/25/2013  3:16 AM    Potassium 3.7 04/25/2013  3:16 AM    Chloride 109 04/25/2013  3:16 AM    CO2 28 04/25/2013  3:16 AM    BUN 12 04/25/2013  3:16 AM    Creatinine 0.53 04/25/2013  3:16 AM    Calcium 9.6  04/25/2013  3:16 AM     CMP:   Lab Results   Component Value Date/Time    Glucose 89 04/25/2013  3:16 AM    Sodium 144 04/25/2013  3:16 AM    Potassium 3.7 04/25/2013  3:16 AM    Chloride 109 04/25/2013  3:16 AM    CO2 28 04/25/2013  3:16 AM    BUN 12 04/25/2013  3:16 AM    Creatinine 0.53 04/25/2013  3:16 AM    Calcium 9.6 04/25/2013  3:16 AM    Anion gap 7 04/25/2013  3:16 AM    BUN/Creatinine ratio 23 04/25/2013  3:16 AM    Alk. phosphatase 81 03/23/2009 10:39 AM    Protein, total 6.7 03/23/2009 10:39 AM    Albumin 3.4 03/23/2009 10:39 AM    Globulin 3.3 03/23/2009 10:39 AM    A-G Ratio 1.0 03/23/2009 10:39 AM     Coagulation:   Lab Results   Component Value Date/Time    Prothrombin time 12.9 04/23/2013  2:10 PM    INR 1.0 04/23/2013  2:10 PM    aPTT 27.5 04/23/2013  2:10 PM     MR Brain Without Contrast   CPT CODE: 16109   HISTORY: Right-sided weakness, stroke.   COMPARISON: CT head 04/23/13.   TECHNIQUE: Brain scanned with axial and sagittal T1W scans, axial T2W scans,   GRE, axial FLAIR, axial diffusion weighted images.   FINDINGS:   There is a mild degree of motion related artifact present on all sequences.   Results in some loss of fine detail throughout the exam.   8mm ovoid focus of hyperintense and restricted diffusion signal in the left side   of the central pons on DWI image 8 consistent with acute infarct. No associated   hemorrhage. No other findings for acute stroke. Grossly patent flow signal in   the bilateral internal carotid arteries and basilar artery. No definite flow   signal detected in the intradural left  vertebral artery. Fully evaluated on MRA.   No abnormal extra-axial fluid collections or acute hemorrhage. No chronic   microhemorrhage.   Multiple foci of nonspecific increased T2 FLAIR signal in the periventricular   white matter. 9 mm length band of hyperintense T2 FLAIR signal in the left   periatrial white matter on axial FLAIR image 14 has perpendicular orientation to   the corpus callosum. None of the  T2 FLAIR hyperintense foci have visible   abnormal signal on T1 noncontrast. There is a subcortical white matter T2 FLAIR   hyperintense signal focus at the right lateral frontal lobe on axial FLAIR image   16. Appears to be chronic encephalomalacia, but limited details due to   artifacts. Tiny chronic infarct in the superior aspect of the left cerebellum.   No suspicious marrow signal in the calvarium. No Chiari malformation.   IMPRESSION:   Exam is limited by motion related artifacts throughout.   8mm acute infarct in the left central pons.   -Dr. Ronne Binning provided a concordant preliminary report regarding the above   finding 04/24/13 at 2233 hrs.   Relatively mild burden of nonspecific increased T2 FLAIR signal in the   periventricular white matter. 9 mm length linear focus of hyperintense FLAIR   signal in the left periatrial white matter has morphology that can be seen in   both chronic microvascular ischemia and demyelinating disease. Other small   lesions are completely nonspecific.   Focal hyperintense T2 FLAIR signal right lateral frontal lobe subcortical white   matter. Most likely represents a remote infarct, but nonspecific. There is a   remote infarct in the superior left cerebellum.   Abnormal flow signal in the intradural left vertebral artery maybe high-grade   stenosis or occlusion    Brain Artery MRA   CPT CODE: 16109   HISTORY: Right-sided weakness, stroke.   COMPARISON: No prior brain artery MRA for comparison.   TECHNIQUE: Brain arteries scanned with axial 3D TOF source images from near   foramen magnum up to sylvian region, MIP reformatted images generated by   technologist.   FINDINGS:   Some loss of fine detail on the exam due to what appears to be motion related   artifacts.   At the right anterior circulation, right ICA patent through the skull base. No   definite ACA or MCA stenosis proximally. The more distal vessels are not as well   evaluated.   At the left anterior circulation, left ICA  is patent to the terminus. No   definite high-grade stenosis. ACA appears normal. Some irregularity in the mid   M1 segment may be artifact or mild stenosis. Distal MCA vessels appear patent,   loss of details secondary to artifacts.   At the posterior circulation, right vertebral artery is patent extradural and   intradural. Cannot confirm any antegrade flow in the intradural left vertebral   artery at the dural margin. Loss of details in the distal vertebral arteries and   basilar due to what appears to be motion related artifacts. There appears to be   some decrease in caliber in the intradural right vertebral artery following   origin of the right PICA. Limited patent flow in the intradural left vertebral   artery. Probable short segment fenestration of the proximal basilar. Distal   basilar flow appears normal. Prominent bilateral posterior communicating   arteries. Hypoplastic P1 segments of both PCAs. Distal PCAs are grossly patent   with limited distal details.  IMPRESSION:   Examination is degraded by what appears to be patient motion artifact.   Corresponding with findings on neck MRA there is absent flow signal in the left   vertebral artery at the dural margin. This may be very slow flow or occlusion.   There appears to be minimal flow in the distal intradural left vertebral artery.   The intradural right vertebral artery is patent. 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 Basilar is patent with probable   short segment fenestration proximally.   Grossly, no significant stenosis seen in the anterior circulation.   Thank you for this referral.    MRA NECK WITH AND WITHOUT CONTRAST   HISTORY: stroke. Right-sided weakness   COMPARISON: None.   TECHNIQUE: Noncontrast scanning of the neck arteries focused at the carotid   artery bifurcations was accomplished with 2D TOF technique. Pre-and post   gadolinium coronal 3-D MRA images of the neck. Images were post processed with    MIP algorithm.   CONTRAST: 20 cc Magnevist IV   FINDINGS:   Antegrade flow seen within the bilateral cervical carotid arteries. Grossly   normal flow signal in the right vertebral artery. Absent flow in the lower   cervical LVA. Some flow seen within the distal extradural LVA above C4 vertebral   level.   Aortic Arch: Typical arch anatomy. Patent innominate and bilateral subclavian   arteries.   Right Carotid: Right CCA is patent to the bifurcation. Some mild atheromatous   plaque along the carotid bulb. The residual lumen of the ICA origin is 4.7 mm.   More distal vessel 5.0 mm. 0% NASCET ICA origin stenosis. ICA patent to the   skull base.   Right Vertebral: Origin from the right subclavian is slightly limited by   respiratory motion, but appears grossly normal. Above this level the right   vertebral artery is patent to the skull base.   Left Carotid: Left CCA appears normal. No stenosis at the ICA origin. ICA patent   to the skull base.   Left Vertebral: Abrupt termination of flow approximately 1 cm above the origin   from the left subclavian. No definite flow signal within the left vertebral   artery below the C3/C4 disc level. Above this level there is some contrast   enhancement to the horizontal C1 segment. There may be some collateral   contribution at the C1-C2 level from muscular collaterals. Limited but some   detectable contrast in the left vertebral artery through the dural margin.   IMPRESSION:   1. There appears to be abrupt termination of flow in the left vertebral artery 1   cm from the origin. Some reconstituted flow distal cervical LVA at C3 vertebral   body level and above, may be through collateral vessels. Limited/small caliber   potentially patent LVA to and through the dura.   2. Patent right vertebral artery without stenosis.   3. No cervical ICA stenosis. 0% by NASCET.      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    Plan:  OK for DC with outpatient therapy.  The occlusion is best treated with anti platelet therapy, since she failed asa will use Plavix and asa for 90 days then Plavix only.     Sincerely,        Casimiro Needle A. Lethea Killings, M.D.

## 2013-04-25 NOTE — Home Health (Signed)
[facility] to follow after discharge for SN/PT/OT/ST - s/p CVA. Mary with [facility] notified of DME order for rw, bsc - all demographic information verified - S. Larae Caison LPN

## 2013-04-25 NOTE — Other (Signed)
Interdisciplinary rounds were conducted by Dr. Kandy Garrison, Neuroscience Medical director, Wyn Forster RN, stroke coordinator, Darl Householder, Physical Therapy, and [employer], the patient's nurse. Discussion included patient's current condition, any tests and patient's expected discharge outcome. Dr. Lethea Killings and team went into pt room where Dr. Lethea Killings demonstrated that pt has slight drift in her right arm but that her fine motor skills were slower in her right hand than in her left. Pt is to have echo done today and then possible discharge.

## 2013-04-25 NOTE — Progress Notes (Signed)
Problem: Mobility Impaired (Adult and Pediatric)  Goal: *Acute Goals and Plan of Care (Insert Text)  Physical Therapy Goals  Initiated 04/25/2013 and to be accomplished within 7 day(s)  1. Patient will move from supine to sit and sit to supine , scoot up and down and roll side to side in bed with modified independence.   2. Patient will transfer from bed to chair and chair to bed with modified independence using the least restrictive device.  3. Patient will perform sit to stand with modified independence.  4. Patient will ambulate with modified independence for 200 feet with the least restrictive device.   5. Patient will ascend/descend 4 stairs with 2 handrail(s) with modified independence.  Outcome: Progressing Towards Goal  PHYSICAL THERAPY EVALUATION    Patient: [name] Short (57 y.o. female)  Date: 04/25/2013  Primary Diagnosis: CVA (cerebral vascular accident)  Stroke        Precautions:Fall      ASSESSMENT :  Based on the objective data described below, the patient presents with hemiplegia and has foot slap and genurecurvatum. Pt uses UE on walker.  Patient will benefit from skilled intervention to address the above impairments.  Patient???s rehabilitation potential is considered to be good.  Factors which may influence rehabilitation potential include:   [ ]          None noted  [ ]          Mental ability/status  [X]          Medical condition  [X]          Home/family situation and support systems  [X]          Safety awareness  [ ]          Pain tolerance/management  [ ]          Other:        PLAN :  Recommendations and Planned Interventions:  [X]            Bed Mobility Training             [X]     Neuromuscular Re-Education  [X]            Transfer Training                   [ ]     Orthotic/Prosthetic Training  [X]            Gait Training                          [ ]     Modalities  [X]            Therapeutic Exercises          [ ]     Edema Management/Control  [X]            Therapeutic Activities            [X]      Patient and Family Training/Education  [ ]            Other (comment):    Frequency/Duration: Patient will be followed by physical therapy 1-2 times per day/4-7 days per week to address goals.  Discharge Recommendations: Home Health and Skilled Nursing Facility  Further Equipment Recommendations for Discharge: bedside commode and rolling walker       SUBJECTIVE:   Patient stated ???I like to walk.???      OBJECTIVE DATA SUMMARY:       Past Medical History   Diagnosis Date   ???  Diabetes     ??? Anemia       Past Surgical History   Procedure Laterality Date   ??? Hx cesarean section   1987   ??? Hx dilation and curettage   1995     Prior Level of Function/Home Situation: independent   Home Situation  Home Environment: Private residence  # Steps to Enter: 3  One/Two Story Residence: One story  Living Alone: No  Support Systems: Family member(s)  Patient Expects to be Discharged to:: Private residence  Current DME Used/Available at Home: None  Tub or Shower Type: Tub/Shower combination  Critical Behavior:  Neurologic State: Alert  Orientation Level: Oriented X4  Cognition: Follows commands   Psychosocial  Patient Behaviors: Calm;Cooperative  Needs Expressed: Educational  Purposeful Interaction: Yes  Pt Identified Daily Priority: Clinical issues (comment)  Caritas Process: Nurture loving kindness;Enable faith/hope;Establish trust;Nurture spiritual self;Teaching/learning;Attend basic human needs;Create healing environment  Caring Interventions: Reassure;Therapeutic modalities  Reassure: Therapeutic listening;Informing;Acceptance;Instilling faith and hope;Caring rounds  Therapeutic Modalities: Humor;Intentional therapeutic touch   Strength:  Right side weak   Tone & Sensation: right hemiplegia   Range Of Motion:functional   Functional Mobility:  Bed Mobility:  Rolling: Modified independence, requires equipment;Additional time   Sit to Supine: Modified independence, requires equipment  Scooting: Supervision  Transfers:  Sit to Stand:  Minimum assistance   Bed to Chair: CGA      Balance:   Sitting: Intact (tolerance > 8 min)  Standing:  (Tolerance = 4 min)  Standing - Static: Fair;Unsupported  Standing - Dynamic : Fair (With SPC on R side)  Ambulation/Gait Training:  Distance (ft): 30 Feet (ft)  Assistive Device: Walker, rolling  Ambulation - Level of Assistance: Minimal assistance   Gait Description (WDL): Exceptions to WDL  Gait Abnormalities: Decreased step clearance   Base of Support: Widened   Speed/Cadence: Slow    Pain:  Pain Scale 1: Numeric (0 - 10)  Pain Intensity 1: 0   Activity Tolerance: fair  Please refer to the flowsheet for vital signs taken during this treatment.  After treatment:   [X]          Patient left in no apparent distress sitting up in chair  [X]          Patient left in no apparent distress in bed  [X]          Call bell left within reach  [ ]          Nursing notified  [ ]          Caregiver present  [ ]          Bed alarm activated      COMMUNICATION/EDUCATION:   [X]          Fall prevention education was provided and the patient/caregiver indicated understanding.  [X]          Patient/family have participated as able in goal setting and plan of care.  [ ]          Patient/family agree to work toward stated goals and plan of care.  [ ]          Patient understands intent and goals of therapy, but is neutral about his/her participation.  [ ]          Patient is unable to participate in goal setting and plan of care.    Thank you for this referral.  Gardner Candle, PT   Time Calculation: 30 mins

## 2013-04-25 NOTE — Progress Notes (Signed)
Problem: Self Care Deficits Care Plan (Adult)  Goal: *Acute Goals and Plan of Care (Insert Text)  Occupational Therapy Goals  Initiated 04/24/2013 within 7 day(s).    1. Patient will perform grooming with independence   2. Patient will perform bathing with modified independence.  3. Patient will perform upper body dressing with modified independence.  4. Patient will perform toilet transfers with modified independence.  5. Patient will perform all aspects of toileting with independence.  6. Patient will participate in upper extremity therapeutic exercise/activities with modified independence for 15 minutes.     OCCUPATIONAL THERAPY TREATMENT    Patient: [name] Short (57 y.o. female)  Date: 04/25/2013  Diagnosis: CVA (cerebral vascular accident)  Stroke <principal problem not specified>       Precautions: Fall  Chart, occupational therapy assessment, plan of care, and goals were reviewed.      ASSESSMENT:Pt demonstrated progress in ADL tasks with verbal instructions to utilize energy conservation/Hemi techniques, and additional time. Please see below for levels of assistance/independence.  Progression toward goals:  [X]           Improving appropriately and progressing toward goals  [ ]           Improving slowly and progressing toward goals  [ ]           Not making progress toward goals and plan of care will be adjusted       PLAN:  Patient continues to benefit from skilled intervention to address the above impairments. Continue treatment per established plan of care.  Discharge Recommendations:  Home Health  Further Equipment Recommendations for Discharge:  bedside commode       SUBJECTIVE:   Patient stated ???I'm sleepy.???      OBJECTIVE DATA SUMMARY:   Cognitive/Behavioral Status:  Neurologic State: Alert, drowsy  Orientation Level: Oriented X4  Cognition: Follows commands  Safety/Judgement: Awareness of environment;Fall prevention  Functional Mobility and Transfers for ADLs:              Bed Mobility:  Rolling: Modified  independence, requires equipment;Additional time  Sit to Supine: Modified independence, requires equipment  Scooting: Supervision              Transfers:  Sit to Stand: Minimum assistance  Bed to Chair: CGA              Toilet Transfer : Stand-by assistance              Balance:  Sitting: Intact (tolerance > 8 min)  Standing:  (Tolerance = 4 min)  Standing - Static: Fair;Unsupported  Standing - Dynamic : Fair (With SPC on R side)  ADL Intervention:    Feeding  Utensil Management: Independent  Food to Mouth: Independent  Drink to Mouth: Independent  Lower Body Bathing  Bathing Assistance: Minimum assistance (to reach all areas of feet)  Perineal  : Contact guard assistance  Position Performed: Standing  Adaptive Equipment:  (SPC on Right side)  Lower Body : Minimum assistance (to complete bottom of feet)  Position Performed: Seated edge of bed  Cues: Verbal cues provided  Lower Body Dressing Assistance  Dressing Assistance: Moderate assistance  Underpants: Stand-by assistance  Socks: Moderate assistance  Leg Crossed Method Used:  (Attempted)  Position Performed: Seated edge of bed  Cues: Verbal cues provided (for Energy Conservation/hemi tech)    Toileting  Toileting Assistance: Stand-by assistance  Bladder Hygiene: Stand-by assistance  Bowel Hygiene: Stand-by assistance  Adaptive Equipment:  (BSC)    Pain:  Pain Scale 1: Numeric (0 - 10)  Pain Intensity 1: 0    Activity Tolerance:    Fair-  Please refer to the flowsheet for vital signs taken during this treatment.  After treatment:   [ ]   Patient left in no apparent distress sitting up in chair  [X]   Patient left in no apparent distress in bed  [X]   Call bell left within reach  [ ]   Nursing notified  [ ]   Caregiver present  [ ]   Bed alarm activated    Theo Dills  Time Calculation: 47 mins

## 2013-04-26 ENCOUNTER — Encounter: Payer: Medicare Other | Admitting: Internal Medicine

## 2013-04-26 NOTE — Progress Notes (Addendum)
Update on equipment:  Orders for ww and bsc were sent to [facility] [phone]) due to pt's ins.

## 2013-04-26 NOTE — Home Health (Signed)
D/c noted 9/22 - new referral [facility] will follow- D Varnell Orvis RN

## 2013-05-04 LAB — SENTARA SPECIMEN COLLN.

## 2013-05-07 NOTE — Telephone Encounter (Signed)
Neuroscience Clinical Care Coordinator  Followup Phone Call      Admit Date: 04/23/13  Discharge Date: 04/25/13  Spoke With: Patient       Topic Covered:  1.   Teaching Materials received regarding stroke and any questions on how to prevent future strokes: Yes  2.  An understanding of medications and the importance of taking them as prescribed: Yes  3.  Information received about recovery and rehabilitation after stroke:Yes  4.  Preparation to return home was completed prior to discharge:Yes   5.  Medication Management and if patient is taking medication as prescribed: Yes, had a question about whether she should increase her lisinpril to 10 mg from 5 mg. Suggested that she talk to her PCP on Monday  6.  Any other comments: ST,PT, and OT are seeing her. She will see Dr. Lethea Killings in October and her PCP on Oct 31. Her PCP is Dr. Andrena Mews    Signs and Symptoms of a stroke/tia were discussed using F.A.S.T. Yes    Lyndle Herrlich, RN    Title: Neuroscience Clinical Care Coordinator

## 2013-05-09 NOTE — Discharge Summary (Signed)
[id number]

## 2013-05-10 NOTE — Discharge Summary (Signed)
[facility]                               [address] [place], [place] [address]                                 DISCHARGE SUMMARY    PATIENT:   [name] Short, [name] Short  MRN:           [id number]    ADMIT DATE:    04/23/2013  BILLING:       [id number] [facility] DATE:     04/25/2013  ATTENDING: Chauncey Reading, MD  DICTATING  Jonah Blue, MD      PRIMARY CARE PHYSICIAN: Jules Husbands, MD    DISCHARGE DIAGNOSES INCLUDE  1. Acute cerebrovascular accident.  2. Type 2 diabetes.  3. Dyslipidemia.  4. Anemia.  5. Hypertension.  6. Tobacco abuse.    DISCHARGE MEDICATIONS INCLUDE  1. Lipitor 40 mg p.o. daily.  2. Plavix 75 mg p.o. daily.  3. Aspirin 81 mg p.o. daily.  4. Vitamin D3, 1000 units p.o. daily.  5. NovoLog insulin 70/30 FlexPen, 30 units subcutaneous in the morning and  25 units subcutaneous in the evening.  6. Lisinopril 5 mg p.o. daily.  7. Check a CBC, CMP, magnesium in 5 days, results to PCP.    INSTRUCTIONS  1. Diet is cardiac, diabetic, mechanical soft diet, thin liquids. Head of  bed greater than 45 degrees for p.o. intake, remain greater than 30 degrees  for 30-45 minutes after p.o. intake, small bites/sips, alternate liquid and  solid with slow feeding rate.  2. Check fingerstick blood sugars before meals and at bedtime.  3. Activity as tolerated.  4. Fall precautions.  5. Aspiration precautions.  6. Decubitus precautions.  7. Incentive spirometry every 30 minutes when awake.  8. PT/OT evaluate and treat.  9. Speech therapy evaluate and treat.  10. Check a CBC, CMP, magnesium in 5 days.  11. Follow up with PCP in 1 week.  12. Follow up with neurologist in 4 weeks.    I discussed the discharge plan with the patient and family member, and they  verbalized understanding and agreed. Neurology cleared the patient for  discharge.    HOSPITAL COURSE: This is a 57 year old female who presented to the ER with  some slurring of speech and right-sided weakness.  The patient was  evaluated, and it was felt that she had a stroke. The patient was admitted  to the stroke unit and was started on the stroke protocol. Neurology was  consulted. The patient underwent MRIs and MRAs. The patient was evaluated  by speech therapy. The patient was also seen by the diabetic educator. The  patient was noted to have some dysphagia, and speech therapy made  recommendations for the diet. Neurology made recommendations to continue  the patient on aspirin as well as Plavix for 90 days and then continue  patient on Plavix alone after the first 90 days of dual antiplatelet  therapy. Discharge plan was discussed with the patient. Home healthcare was  arranged,and the patient was subsequently discharged to home.    Total time for the discharge was more than 35 minutes.  Jonah Blue, MD    VT:wmx  D: 05/09/2013 T: 05/10/2013 04:04 A  Job: [id number]  CScriptDoc #: [id number]  cc:   Chauncey Reading, MD        Cecile Hearing., MD        Jonah Blue, MD

## 2013-05-23 NOTE — Progress Notes (Signed)
Re:  [name] Short,Follow up visit     05/23/2013 9:51 AM    SSN: [id number]    Subjective:   [name] Short returns for follow up.  She has been taking her medications regularly.  She has noticed continued heaviness of the right side.    Medications:    Current Outpatient Prescriptions   Medication Sig Dispense Refill   ??? furosemide (LASIX) 20 mg tablet Take  by mouth daily.       ??? EXENATIDE MICROSPHERES (BYDUREON SC) by SubCUTAneous route.       ??? cholecalciferol (VITAMIN D3) 1,000 unit tablet Take 1 tablet by mouth daily.  30 tablet  0   ??? insulin aspart protamine/insulin aspart (NOVOLOG MIX 70-30 FLEXPEN) 100 unit/mL (70-30) flex pen 30 units SQ every morning and 25 units SQ every evening  1 Package  0   ??? aspirin 81 mg chewable tablet Take 1 tablet by mouth daily.  30 tablet  2   ??? atorvastatin (LIPITOR) 40 mg tablet Take 1 tablet by mouth nightly.  30 tablet  0   ??? clopidogrel (PLAVIX) 75 mg tablet Take 1 tablet by mouth daily.  30 tablet  2   ??? lisinopril (PRINIVIL, ZESTRIL) 5 mg tablet Take 1 tablet by mouth daily.  30 tablet  0   ??? OTHER Check CBC, CMP, Mg in 5 days. Results to PCP    Dx-CVA  1 each  0   ??? OTHER This is to certify that  [name] Short  was admitted to [facility] on 04/23/2013 and discharged on 04/25/2013 , and has been advised to take rest at home for 14 ( Fourteen ) more days and then resume work if symptom free.  1 each  0       Vital signs:  BP 115/63   Pulse 86   Temp(Src) 98 ??F (36.7 ??C) (Oral)   Resp 19   Ht 5' (1.524 m)   Wt 79.833 kg (176 lb)   BMI 34.37 kg/m2   SpO2 98%    Review of Systems:   As above otherwise 11 point review of systems negative including;   Constitutional no fever or chills  Skin denies rash or itching  HENT  Denies tinnitus, hearing lose  Eyes denies diplopia vision lose  Respiratory denies sortness of breath  Cardiovascular denies chest pain, dyspnea on exertion  Gastrointestinal denies nausea, vomiting, diarrhea, constipation  Genitourinary  denies incontinence  Musculoskeletal denies joint pain or swelling  Endocrine denies weight change  Hematology denies easy bruising or bleeding   Neurological as above in HPI      Patient Active Problem List   Diagnosis Code   ??? Diabetes 250.00   ??? Anemia 285.9   ??? CVA (cerebral vascular accident) 434.91   ??? Stroke 434.91         Objective: The patient is awake, alert, and oriented x 3.  Speech is fluent and memory is intact.  Cranial Nerves: II ??? Visual fields are full to confrontation.  III, IV, VI ??? Extraocular movements are intact. There is no nystagmus. V ??? Facial sensation is intact to pinprick.  VII ??? Face is symmetrical.  VIII - Hearing is present.  IX, X, XII ??? Palate is symmetrical.   Motor:  The patient has a mild right hemiparesis.  She has a pronator drift and is about 5-/5 throughout both the arm and the leg in an upper motor neuron distribution. Tone is normal. Reflexes are  2+ and symmetrical. Plantars are down going. Gait is wide based but fairly symmetrical.    CBC:   Lab Results   Component Value Date/Time    WBC 4.5 04/25/2013  3:16 AM    RBC 4.63 04/25/2013  3:16 AM    HGB 12.5 04/25/2013  3:16 AM    HCT 36.5 04/25/2013  3:16 AM    PLATELET 190 04/25/2013  3:16 AM     BMP:   Lab Results   Component Value Date/Time    Glucose 89 04/25/2013  3:16 AM    Sodium 144 04/25/2013  3:16 AM    Potassium 3.7 04/25/2013  3:16 AM    Chloride 109 04/25/2013  3:16 AM    CO2 28 04/25/2013  3:16 AM    BUN 12 04/25/2013  3:16 AM    Creatinine 0.53 04/25/2013  3:16 AM    Calcium 9.6 04/25/2013  3:16 AM     CMP:   Lab Results   Component Value Date/Time    Glucose 89 04/25/2013  3:16 AM    Sodium 144 04/25/2013  3:16 AM    Potassium 3.7 04/25/2013  3:16 AM    Chloride 109 04/25/2013  3:16 AM    CO2 28 04/25/2013  3:16 AM    BUN 12 04/25/2013  3:16 AM    Creatinine 0.53 04/25/2013  3:16 AM    Calcium 9.6 04/25/2013  3:16 AM    Anion gap 7 04/25/2013  3:16 AM    BUN/Creatinine ratio 23 04/25/2013  3:16 AM    Alk. 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    Plan:  Continue current medications.  OK to stop the aspirin in December.  Continue on Plavix 75 mg daily.  Will see here only as needed.    Sincerely,        Casimiro Needle A. Lethea Killings, M.D.

## 2013-05-23 NOTE — Communication Body (Signed)
Re:  [name] Short,Follow up visit     05/23/2013 9:51 AM    SSN: [id number]    Subjective:   [name] Short returns for follow up.  She has been taking her medications regularly.  She has noticed continued heaviness of the right side.    Medications:    Current Outpatient Prescriptions   Medication Sig Dispense Refill   ??? furosemide (LASIX) 20 mg tablet Take  by mouth daily.       ??? EXENATIDE MICROSPHERES (BYDUREON SC) by SubCUTAneous route.       ??? cholecalciferol (VITAMIN D3) 1,000 unit tablet Take 1 tablet by mouth daily.  30 tablet  0   ??? insulin aspart protamine/insulin aspart (NOVOLOG MIX 70-30 FLEXPEN) 100 unit/mL (70-30) flex pen 30 units SQ every morning and 25 units SQ every evening  1 Package  0   ??? aspirin 81 mg chewable tablet Take 1 tablet by mouth daily.  30 tablet  2   ??? atorvastatin (LIPITOR) 40 mg tablet Take 1 tablet by mouth nightly.  30 tablet  0   ??? clopidogrel (PLAVIX) 75 mg tablet Take 1 tablet by mouth daily.  30 tablet  2   ??? lisinopril (PRINIVIL, ZESTRIL) 5 mg tablet Take 1 tablet by mouth daily.  30 tablet  0   ??? OTHER Check CBC, CMP, Mg in 5 days. Results to PCP    Dx-CVA  1 each  0   ??? OTHER This is to certify that  [name] Short  was admitted to [facility] on 04/23/2013 and discharged on 04/25/2013 , and has been advised to take rest at home for 14 ( Fourteen ) more days and then resume work if symptom free.  1 each  0       Vital signs:  BP 115/63   Pulse 86   Temp(Src) 98 ??F (36.7 ??C) (Oral)   Resp 19   Ht 5' (1.524 m)   Wt 79.833 kg (176 lb)   BMI 34.37 kg/m2   SpO2 98%    Review of Systems:   As above otherwise 11 point review of systems negative including;   Constitutional no fever or chills  Skin denies rash or itching  HENT  Denies tinnitus, hearing lose  Eyes denies diplopia vision lose  Respiratory denies sortness of breath  Cardiovascular denies chest pain, dyspnea on exertion  Gastrointestinal denies nausea, vomiting, diarrhea, constipation  Genitourinary  denies incontinence  Musculoskeletal denies joint pain or swelling  Endocrine denies weight change  Hematology denies easy bruising or bleeding   Neurological as above in HPI      Patient Active Problem List   Diagnosis Code   ??? Diabetes 250.00   ??? Anemia 285.9   ??? CVA (cerebral vascular accident) 434.91   ??? Stroke 434.91         Objective: The patient is awake, alert, and oriented x 3.  Speech is fluent and memory is intact.  Cranial Nerves: II ??? Visual fields are full to confrontation.  III, IV, VI ??? Extraocular movements are intact. There is no nystagmus. V ??? Facial sensation is intact to pinprick.  VII ??? Face is symmetrical.  VIII - Hearing is present.  IX, X, XII ??? Palate is symmetrical.   Motor:  The patient has a mild right hemiparesis.  She has a pronator drift and is about 5-/5 throughout both the arm and the leg in an upper motor neuron distribution. Tone is normal. Reflexes are   2+ and symmetrical. Plantars are down going. Gait is wide based but fairly symmetrical.    CBC:   Lab Results   Component Value Date/Time    WBC 4.5 04/25/2013  3:16 AM    RBC 4.63 04/25/2013  3:16 AM    HGB 12.5 04/25/2013  3:16 AM    HCT 36.5 04/25/2013  3:16 AM    PLATELET 190 04/25/2013  3:16 AM     BMP:   Lab Results   Component Value Date/Time    Glucose 89 04/25/2013  3:16 AM    Sodium 144 04/25/2013  3:16 AM    Potassium 3.7 04/25/2013  3:16 AM    Chloride 109 04/25/2013  3:16 AM    CO2 28 04/25/2013  3:16 AM    BUN 12 04/25/2013  3:16 AM    Creatinine 0.53 04/25/2013  3:16 AM    Calcium 9.6 04/25/2013  3:16 AM     CMP:   Lab Results   Component Value Date/Time    Glucose 89 04/25/2013  3:16 AM    Sodium 144 04/25/2013  3:16 AM    Potassium 3.7 04/25/2013  3:16 AM    Chloride 109 04/25/2013  3:16 AM    CO2 28 04/25/2013  3:16 AM    BUN 12 04/25/2013  3:16 AM    Creatinine 0.53 04/25/2013  3:16 AM    Calcium 9.6 04/25/2013  3:16 AM    Anion gap 7 04/25/2013  3:16 AM    BUN/Creatinine ratio 23 04/25/2013  3:16 AM    Alk. 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    Plan:  Continue current medications.  OK to stop the aspirin in December.  Continue on Plavix 75 mg daily.  Will see here only as needed.    Sincerely,        Loreda Silverio A. Dallys Nowakowski, M.D.

## 2013-06-07 ENCOUNTER — Ambulatory Visit (INDEPENDENT_AMBULATORY_CARE_PROVIDER_SITE_OTHER): Payer: Medicare Other | Admitting: Internal Medicine

## 2013-06-07 ENCOUNTER — Encounter: Payer: Self-pay | Admitting: Internal Medicine

## 2013-06-07 VITALS — BP 117/86 | HR 114 | Temp 97.7°F | Ht 62.5 in | Wt 266.9 lb

## 2013-06-07 DIAGNOSIS — I509 Heart failure, unspecified: Secondary | ICD-10-CM

## 2013-06-07 DIAGNOSIS — I10 Essential (primary) hypertension: Secondary | ICD-10-CM

## 2013-06-07 DIAGNOSIS — E119 Type 2 diabetes mellitus without complications: Secondary | ICD-10-CM | POA: Diagnosis not present

## 2013-06-07 DIAGNOSIS — E785 Hyperlipidemia, unspecified: Secondary | ICD-10-CM | POA: Diagnosis not present

## 2013-06-07 DIAGNOSIS — Z0000 Encounter for general adult medical examination without abnormal findings: Secondary | ICD-10-CM

## 2013-06-07 LAB — POCT GLYCOSYLATED HEMOGLOBIN (HGB A1C): Hemoglobin A1C: 6.5

## 2013-06-07 NOTE — Assessment & Plan Note (Signed)
BP Readings from Last 3 Encounters:  06/07/13 117/86  01/25/13 110/80  12/21/12 140/80    Lab Results  Component Value Date   NA 141 09/28/2012   K 4.0 09/28/2012   CREATININE 0.70 09/28/2012    Assessment: Blood pressure control: controlled Progress toward BP goal:  at goal Comments:   Plan: Medications:  continue current medications Educational resources provided: brochure;handout;video Self management tools provided:   Other plans: Blood pressure is well controlled. We'll continue current regimen, including enalapril 20 mg daily, Lasix 80 mg daily, metoprolol 50 mg daily.

## 2013-06-07 NOTE — Assessment & Plan Note (Addendum)
-  did foot exam, she has good pulse bilaterally -Patient refused other health maintenance related test or exam, including pneumococcal vaccination, mammogram, flu shot, eye examination and Pap smear.

## 2013-06-07 NOTE — Assessment & Plan Note (Signed)
It is stable. 2-D echo on 11/18/05 showed EF 55-65%. Currently patient is euvolemic. Her body weight is stable,  268 on 01/25/13-->266.9 pounds today. She does not have leg edema or JVD. Her lung auscultation is clear bilaterally. Will continue current regimen, including Lasix 80 mg daily, aspirin, metoprolol 50 mg daily, enalapril 20 mg daily.

## 2013-06-07 NOTE — Assessment & Plan Note (Signed)
LDL was 77 on 09/28/12. She is currently taking Zocor 40 mg daily. She did not notice any side effects, such as muscle pain. We'll continue current regimen.

## 2013-06-07 NOTE — Patient Instructions (Signed)
1. You have done great job in taking all your medications. I appreciate it very much. Please continue doing that. 2. Please take all medications as prescribed.  3. If you have worsening of your symptoms or new symptoms arise, please call the clinic ([phone]), or go to the ER immediately if symptoms are severe.     

## 2013-06-07 NOTE — Assessment & Plan Note (Signed)
Lab Results  Component Value Date   HGBA1C 6.5 06/07/2013   HGBA1C 6.4 12/21/2012   HGBA1C 6.8 09/28/2012     Assessment: Diabetes control: good control (HgbA1C at goal) Progress toward A1C goal:  at goal Comments:   Plan: Medications:  continue current medications Home glucose monitoring: Frequency:   Timing:   Instruction/counseling given: reminded to bring blood glucose meter & log to each visit Educational resources provided: brochure;handout Self management tools provided:   Other plans: It is well controled. Her A1c is 6.5 today. Will continue current regimen, including lantus 30 units twice a day and glipizide 10 mg twice a day.

## 2013-06-07 NOTE — Progress Notes (Signed)
Patient ID: Julie Elliott, female   DOB: [birth year], 57 y.o.   MRN: [id number] Subjective:   Patient ID: Julie Elliott female   DOB: October 12, 1955 57 y.o.   MRN: [id number]  CC:   Follow up visit.  HPI:  [name] is a 57 y.o. man lady with past medical history as outlined below, who presents for a followup visit today  1.) DM-II: She is currently taking Lantus 30 units twice a day. Today her A1c is 6.4 on 12/21/12-->6.5 today. she is also taking glipizide 10 mg bid daily. She did not have symptoms for hypoglycemia.   2.) Hypertension: Patient's blood pressure is well controlled. Her blood pressure is 117/86  today.  3.) Hyperlipidemia: LDL was 77 on 09/28/12. Patient is currently taking Zocor 40 mg daily. She did not noticed any side effects, such as muscle pain. Her AST and ALT was normal at 03/12/11.   4. CHF: 2 d echo on 11/08/05 showed Ef of 55 to 66%. Patient feels normal. Patient does not have chest pain, shortness of breath, palpitation, cough or leg edema.   5. tobacco abuse: Patient reports that she quit smoking for more than 2 month. She does not have any side effects from stopping smoking. She is happy about her decision.  ROS:  Denies fever, chills, fatigue, headaches,  cough, chest pain, SOB,  abdominal pain, diarrhea, constipation, dysuria, urgency, frequency, hematuria.    Past Medical History  Diagnosis Date  . Hypertension     well controlled  . DJD (degenerative joint disease)   . HLD (hyperlipidemia) 2008    stable, well controlled  . DM (diabetes mellitus) 2008    stable HgBA1C at 6.5  . GERD (gastroesophageal reflux disease)     well controlled on Omeprazole  . Chronic headaches    Current Outpatient Prescriptions  Medication Sig Dispense Refill  . aspirin 81 MG EC tablet Take 1 tablet (81 mg total) by mouth daily.  90 tablet  1  . B-D ULTRA-FINE 33 LANCETS MISC Use for checking blood sugar, 3 times per day. Diagnosis Code is 250.00  100 each  11  . Calcium-Vitamin D  600-125 MG-UNIT TABS Take 1 tablet by mouth daily.  90 each  1  . enalapril (VASOTEC) 20 MG tablet Take 1 tablet (20 mg total) by mouth 2 (two) times daily.  180 tablet  3  . furosemide (LASIX) 80 MG tablet Take 1 tablet (80 mg total) by mouth daily.  90 tablet  3  . glipiZIDE (GLUCOTROL) 10 MG tablet Take 1 tablet (10 mg total) by mouth 2 (two) times daily before a meal.  240 tablet  3  . glucose blood (BAYER CONTOUR TEST) test strip [employer]. Use to test blood sugar 3 times a day.  100 each  11  . insulin glargine (LANTUS SOLOSTAR) 100 UNIT/ML injection Inject 30 Units into the skin 2 (two) times daily.  10 mL  11  . Insulin Pen Needle 32G X 4 MM MISC Use for insulin injection, bid. Diagnosis code is 250.00 for DM-II.  100 each  11  . metoprolol succinate (TOPROL-XL) 50 MG 24 hr tablet Take 1 tablet (50 mg total) by mouth daily.  90 tablet  3  . naproxen (NAPROSYN) 500 MG tablet Take 1 tablet (500 mg total) by mouth 2 (two) times daily.  180 tablet  3  . omeprazole (PRILOSEC) 20 MG capsule Take 1 capsule (20 mg total) by mouth 2 (two) times daily.  180 capsule  2  . potassium chloride SA (K-DUR,KLOR-CON) 20 MEQ tablet Take 1 tablet (20 mEq total) by mouth daily.  90 tablet  3  . simvastatin (ZOCOR) 40 MG tablet Take 1 tablet (40 mg total) by mouth daily.  90 tablet  3  . traMADol (ULTRAM) 50 MG tablet Take 1 tablet (50 mg total) by mouth every 6 (six) hours as needed for pain.  20 tablet  0   No current facility-administered medications for this visit.   Family History  Problem Relation Age of Onset  . Diabetes Mother   . Heart disease Mother   . Prostate cancer Father    History   Social History  . Marital Status: Single    Spouse Name: N/A    Number of Children: N/A  . Years of Education: N/A   Social History Main Topics  . Smoking status: Former Smoker -- 0.30 packs/day    Types: Cigarettes    Quit date: 04/18/2013  . Smokeless tobacco: None     Comment: Smokes 5-6 cigerettes  per day  . Alcohol Use: None  . Drug Use: None  . Sexual Activity: None   Other Topics Concern  . None   Social History Narrative   to get supplies nfrom [facility] per pt request.[phone]    Review of Systems: Full 14-point review of systems otherwise negative. See HPI.   Objective:  Physical Exam: Filed Vitals:   06/07/13 1401  BP: 117/86  Pulse: 114  Temp: 97.7 F (36.5 C)  TempSrc: Oral  Height: 5' 2.5" (1.588 m)  Weight: 266 lb 14.4 oz (121.065 kg)  SpO2: 98%   Physical Exam: There were no vitals filed for this visit. Constitutional: Vital signs reviewed.  Patient is a well-developed and well-nourished, in no acute distress and cooperative with exam.   HEENT:  Head: Normocephalic and atraumatic Mouth: no erythema or exudates, MMM Eyes: PERRL, EOMI, conjunctivae normal, No scleral icterus.  Neck: Supple, Trachea midline normal ROM, No JVD, mass, thyromegaly, or carotid bruit present. No lymph node enlargement. Cardiovascular: RRR, S1 normal, S2 normal, no MRG, pulses symmetric and intact bilaterally Pulmonary/Chest: CTAB, no wheezes, rales, or rhonchi Abdominal: Soft. Non-tender, non-distended, bowel sounds are normal, no masses, organomegaly, or guarding present.   GU: no CVA tenderness Musculoskeletal: No joint deformities, erythema, or stiffness, ROM full and non-tender Hematology: no cervical, inginal, or axillary adenopathy.  Neurological: A&O x3, Strength is normal and symmetric bilaterally, cranial nerve II-XII are grossly intact, no focal motor deficit, sensory intact to light touch bilaterally.  Psychiatric: Normal mood and affect. speech and behavior is normal. Judgment and thought content normal. Cognition and memory are normal.    Assessment & Plan:

## 2013-06-08 NOTE — Progress Notes (Signed)
Case discussed with Dr. Niu at the time of the visit.  We reviewed the resident's history and exam and pertinent patient test results.  I agree with the assessment, diagnosis, and plan of care documented in the resident's note.    

## 2013-06-13 NOTE — Progress Notes (Addendum)
PT DAILY TREATMENT NOTE - MCR 8-14    Patient Name: [name] Short  Date:06/13/2013  DOB: 03-19-1956  [x]   Patient DOB Verified  Payor: [employer]  [place]  Product Type: Managed Care Medicaid     In time:5:48  Out time:6:52  Total Treatment Time (min): 54  Total Timed Codes (min): 54  1:1 Treatment Time (MC only): 54   Visit #: 2 of 12    Treatment Area: CVA (cerebral infarction) [434.91]    SUBJECTIVE  Pain Level (0-10 scale): denies pain   Any medication changes, allergies to medications, adverse drug reactions, diagnosis change, or new procedure performed?: [x]  No    []  Yes (see summary sheet for update)  Subjective functional status/changes:   []  No changes reported  Pt reports that she experienced a sharp pain in the eye on September 20th while she was at work. She was able to work her entire shift, went home and put an ice pack on her eye and went to sleep. She then noticed that later on when she was driving she was unable to press down on the gas or lift her leg out of her truck as normal. She awoke the next morning and was unable to bear any weight on RLE. She reports that she fell due to R sided weakness. She was then driven to the ER and admitted to the hospital. Pt spent two days in the hospital and was discharged home with  ST, OT and PT home health care.Pt reports Inability to sleep on RLE without numbness and tingling in BUE.  Pt is referred to OP physical to continue towards reaching her functional goals. 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    OBJECTIVE            10 min Patient Education: [x]  Review HEP    []  Progressed/Changed HEP based on:   []  positioning   []  body mechanics   []  transfers   []  heat/ice application        Other Objective/Functional Measures: See POC    Physical Therapy Evaluation ??? Neurologic    Posture: [x]  Poor    []  Fair    []  Good    Describe: forward head, rounded shoulders,  Forward flexed posture,  Gait: []  Normal    [x]   Abnormal    Device: SPC  on R side  Describe:  Slow cadence, decreased push-off on RLE, decreased step length, RUE flexor synergy  Pt instructed on proper use of cane on L side but reports that cane on L does not feel comfortable while walking    ROM:                     BLEs within functional limits    Strength (MMT):                                          Hip L (1-5) R (1-5)   Hip Flexion 4- 3+   Hip Ext 3 2   Hip ABD 4 3+     Hip ADD 4+ 4+   Hip ER NT NT   Hip IR NT NT     Knee L (1-5) R (1-5)   Knee Flexion 4 4   Knee Extension 4 4   Ankle PF 4 2+   Ankle DF  4- 4-   Other         Motor Control:    Sensation: intact B feet    Reflexes: [x]  Not Tested  Balance/ Equilibrium:         Sitting Balance: Static:  [x]  Good    []  Fair    []  Poor     Dynamic:   []  Good    [x]  Fair    []  Poor        Standing Balance: Static:   []  Good    [x]  Fair    []  Poor     Dynamic:   []  Good    []  Fair    []  Poor           Single Leg Stance:         Eyes Open  Eyes Closed   L Unable to perform L NT   R Unable to perform R NT     Romberg: 30 secs eyes closed    RLE modified tandem with eyes closed: 7 sec   LLE modified tandem with eyes closed: 30 sec , increased sway noted    Other:       Impaired Judgement: []  Y    []  N      Impaired Vision:  []  Y    []  N      Safety Awareness Deficits  [x]  Y    []  N      Impaired Hearing  []  Y    []  N      Able to Express Needs []  Y    []  N    Optional Tests:        Other test /comments: BP: 118/68       Pain Level (0-10 scale) post treatment: 0/10    ASSESSMENT/Changes in Function: See POC    [x]   See Plan of Care  []   See progress note/recertification  []   See Discharge Summary         Progress towards goals / Updated goals:  See POC    PLAN  []   Upgrade activities as tolerated     [x]   Continue plan of care  []   Update interventions per flow sheet       []   Discharge due to:_  []   Other:_      Starlyn Skeans, SPT 06/13/2013  5:42 PM

## 2013-06-13 NOTE — Progress Notes (Addendum)
[facility] ??? [facility]  [address]  [place], [place] [address]  [phone]  [phone] fax    Plan of Care/ Statement of Necessity for Physical Therapy Services    Patient name: [name] Short Start of Care: 06/13/2013   Referral source: Marissa Nestle.,* DOB: 18-May-1956    Medical Diagnosis: CVA (cerebral infarction) [434.91]   Onset Date:04/23/2013    Treatment Diagnosis: R hemiparesis   Prior Hospitalization: see medical history Provider#: [id number]   Medications: Verified on Patient summary List    Comorbidities: Diabetes, Arthritis, HTN   Prior Level of Function: independent with all ADLs and functional activities, employed      The Plan of Care and following information is based on the information from the initial evaluation.  Assessment/ key information: Pt is a 57 year old female s/p L sided stroke on 04/25/2013 with R hemiparesis. Pt reported that she had eye pain and progressively experienced weakness with driving and getting in/out of her truck. She awoke the next morning and was unable to bear any weight on RLE. She reports that she fell that morning due to R sided weakness. She was then driven to the ER and admitted to the hospital. Pt spent two days in the hospital and was discharged home with ST, OT and PT home health care. Pt reports Inability to sleep on RLE without numbness and tingling in BUE. Sensation is intact bilaterally.Pt presents with, abnormal gait pattern with SPC, decreased balance, decreased RLE strength grossly which has decreased her functional mobility. 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    Problem List: pain affecting function, decrease ROM, decrease strength, impaired gait/ balance, decrease ADL/ functional abilitiies, decrease activity tolerance, decrease flexibility/ joint mobility and decrease  transfer abilities   Treatment Plan may include any combination of the following: Therapeutic exercise, Therapeutic activities, Neuromuscular re-education, Physical agent/modality, Gait/balance training, Manual therapy and Patient education  Patient / Family readiness to learn indicated by: trying to perform skills and interest  Persons(s) to be included in education: patient (P)  Barriers to Learning/Limitations: no  Patient Goal (s): ???To improve Right side???  Patient Self Reported Health Status: fair  Rehabilitation Potential: good    Short Term Goals: To be accomplished in 2  weeks:     1. Pt will report compliance with HEP to take an active role in the rehabilitation process for improved activity tolerance and improved functional mobility.  Long Term Goals: To be accomplished in 6  weeks:    1. Pt will demonstrate bridge mod Independently for improved bed mobility.  2. Pt will demonstrate a 14 point improvement on LEFS indicating significant improvement in function.  3. Pt will ambulate on even and uneven surfaces without AD safely demonstrating no signs of imbalance for improved community ambulation.  4. Pt will increase RLE Plantar flexion strength to 4/5 for increased toe off during gait and improved ability to climb stairs.    Frequency / Duration: Patient to be seen 2-3 times per week for 4-6  weeks.    Patient/ Caregiver education and instruction: exercises   [x]   Plan of care has been reviewed with PTA    Starlyn Skeans 06/13/2013 7:19 PM    ________________________________________________________________________    I certify that the above Therapy Services are being furnished while the patient is  under my care. I agree with the treatment plan and certify that this therapy is necessary.    Physician's Signature:____________________  Date:____________Time: _________    Please sign and return to [facility] ??? [facility]  [address]  [place], [place] [address]  [phone]  [phone] fax

## 2013-06-17 NOTE — Progress Notes (Signed)
PT DAILY TREATMENT NOTE 8-14    Patient Name: [name] Short  Date:06/17/2013  DOB: [birth year]  [x]   Patient DOB Verified  Payor: [employer]  [place]  Product Type: Managed Care Medicaid     In time:5:35  Out time:6:10  Total Treatment Time (min): 35  Visit #: 2 of 12    Treatment Area: CVA (cerebral infarction) [434.91]    SUBJECTIVE  Pain Level (0-10 scale): 0/10  Any medication changes, allergies to medications, adverse drug reactions, diagnosis change, or new procedure performed?: [x]  No    []  Yes (see summary sheet for update)  Subjective functional status/changes:   []  No changes reported  Pt reports most weakness in her R leg. Pt says she wants to walk better and eventually run. Pt states she has a difficult time getting in and out of her tub because she doesn't have a shower seat/bench to give her support. Pt states non compliant with HEP due to busy schedule. Pt states dog walking is her therapy exercise.    OBJECTIVE    35 min Therapeutic Exercise:  [x]  See flow sheet :   Rationale: increase ROM, increase strength, improve coordination, improve balance and increase proprioception to improve the patient???s ability to ambulate with decreased fall risk          With   []  TE   []  TA   []  neuro   []  other: Patient Education: [x]  Review HEP    []  Progressed/Changed HEP based on:   []  positioning   []  body mechanics   []  transfers   []  heat/ice application    []  other:      Other Objective/Functional Measures: Decreased DF during ambulation. No pain with exercises but increased fatigue.      Pain Level (0-10 scale) post treatment: 0/10    ASSESSMENT/Changes in Function: Pt with good initiation of exercises in therapy. Pt continues to demonstrate decreased R LE strength evidenced by lack of DF with ambulation and increased fatigue with exercises. Continue to progress gait training and functional strengthening to improve pt safety with entering/exiting the bathtub and ambulating home and community  distances.    Patient will continue to benefit from skilled PT services to modify and progress therapeutic interventions, address functional mobility deficits, address ROM deficits, address strength deficits, analyze and cue movement patterns, analyze and modify body mechanics/ergonomics, address imbalance/dizziness and instruct in home and community integration to attain remaining goals.          Progress towards goals / Updated goals:  Short Term Goals: To be accomplished in 2 weeks:   1. Pt will report compliance with HEP to take an active role in the rehabilitation process for improved activity tolerance and improved functional mobility.   Long Term Goals: To be accomplished in 6 weeks:   1. Pt will demonstrate bridge mod Independently for improved bed mobility.   2. Pt will demonstrate a 14 point improvement on LEFS indicating significant improvement in function.   3. Pt will ambulate on even and uneven surfaces without AD safely demonstrating no signs of imbalance for improved community ambulation.   4. Pt will increase RLE Plantar flexion strength to 4/5 for increased toe off during gait and improved ability to climb stairs.    PLAN  [x]   Upgrade activities as tolerated     [x]   Continue plan of care  []   Update interventions per flow sheet       []   Discharge due to:_  [  x]  Other: Work on stepping over items for improved ability to step into/out of the shower safely.  Perform DGI and gait training.    Edmonia Lynch, PTA 06/17/2013  7:18 PM

## 2013-06-21 NOTE — Progress Notes (Signed)
PT DAILY TREATMENT NOTE - MCR 8-14    Patient Name: [name] Short  Date:06/21/2013  DOB: [birth year]  [x]   Patient DOB Verified  Payor: [employer]  [place]  Product Type: Managed Care Medicaid     In time:545  Out time:620  Total Treatment Time (min): 35  Total Timed Codes (min): 35   Visit #: 3 of 12    Treatment Area: CVA (cerebral infarction) [434.91]    SUBJECTIVE  Pain Level (0-10 scale): 0/10  Any medication changes, allergies to medications, adverse drug reactions, diagnosis change, or new procedure performed?: [x]  No    []  Yes (see summary sheet for update)  Subjective functional status/changes:   []  No changes reported  Pt states that she is doing well, trying to get here on time but having difficulty with traffic.     OBJECTIVE    25 min Therapeutic Exercise:  [x]  See flow sheet :   Rationale: increase ROM, increase strength and improve coordination to improve the patient???s ability to improve sit to stand transfers, stairs, and ambulation.      10 min Neuromuscular Re-education:  [x]   See flow sheet : DGI   Rationale: improve coordination and improve balance  to improve the patient???s ability to ambulate safely.     Other Objective/Functional Measures:   DGI: 22/24     Pain Level (0-10 scale) post treatment: 0/10    ASSESSMENT/Changes in Function:   Pt performed all exs well today, ambulating without AD and minimal to no gait deviations.     Patient will continue to benefit from skilled PT services to modify and progress therapeutic interventions, address functional mobility deficits, address ROM deficits, address strength deficits, analyze and cue movement patterns, analyze and modify body mechanics/ergonomics, address imbalance/dizziness and instruct in home and community integration to attain remaining goals.     Progress towards goals / Updated goals:   Short Term Goals: To be accomplished in 2 weeks:   1. Pt will report compliance with HEP to take an active role in the rehabilitation  process for improved activity tolerance and improved functional mobility.   Long Term Goals: To be accomplished in 6 weeks:   1. Pt will demonstrate bridge mod Independently for improved bed mobility.   2. Pt will demonstrate a 14 point improvement on LEFS indicating significant improvement in function.   3. Pt will ambulate on even and uneven surfaces without AD safely demonstrating no signs of imbalance for improved community ambulation.   4. Pt will increase RLE Plantar flexion strength to 4/5 for increased toe off during gait and improved ability to climb stairs.      PLAN  [x]   Upgrade activities as tolerated     [x]   Continue plan of care  []   Update interventions per flow sheet       []   Discharge due to:_  []   Other:_      Kirk Ruths, PT 06/21/2013  6:25 PM

## 2013-06-22 ENCOUNTER — Other Ambulatory Visit: Payer: Self-pay | Admitting: *Deleted

## 2013-06-22 DIAGNOSIS — E119 Type 2 diabetes mellitus without complications: Secondary | ICD-10-CM

## 2013-06-22 MED ORDER — INSULIN GLARGINE 100 UNIT/ML SOLOSTAR PEN: 30.0000 [IU] | PEN_INJECTOR | Freq: Two times a day (BID) | SUBCUTANEOUS | Status: DC

## 2013-06-22 NOTE — Telephone Encounter (Signed)
Done

## 2013-06-22 NOTE — Telephone Encounter (Signed)
This needs to be [employer].  Pt needs now if possible

## 2013-06-23 ENCOUNTER — Other Ambulatory Visit: Payer: Self-pay | Admitting: *Deleted

## 2013-06-23 DIAGNOSIS — I509 Heart failure, unspecified: Secondary | ICD-10-CM

## 2013-06-23 MED ORDER — FUROSEMIDE 80 MG PO TABS: 80.0000 mg | ORAL_TABLET | Freq: Every day | ORAL | Status: DC

## 2013-06-24 NOTE — Progress Notes (Signed)
PT DAILY TREATMENT NOTE 8-14    Patient Name: [name] Short  Date:06/24/2013  DOB: 02-01-1956  [x]   Patient DOB Verified  Payor: [employer]  [place] [employer]  Product Type: Managed Care Medicaid     In time:5:30  Out time:6:35  Total Treatment Time (min): 65  Visit #: 4 of 12    Treatment Area: CVA (cerebral infarction) [434.91]    SUBJECTIVE  Pain Level (0-10 scale): 7/10 R hip  Any medication changes, allergies to medications, adverse drug reactions, diagnosis change, or new procedure performed?: [x]  No    []  Yes (see summary sheet for update)  Subjective functional status/changes:   []  No changes reported  Pt states R hip is giving her more pain today; described as "barbie leg" where the leg pops off of a barbie. Pt feels the exercises are helping her get stronger but is still concerned about falling when getting in and out of her bath tub.     OBJECTIVE    Modality rationale: decrease pain to improve the patient???s ability to ambulate and safely transfer in/out of her tub   Min Type Additional Details    []  Estim: [] Att   [] Unatt        [] TENS instruct                  [] IFC  [] Premod   [] NMES                     [] Other:  [] w/US   [] w/ice   [] w/heat  Position:  Location:    []   Traction: []  Cervical       [] Lumbar                       []  Prone          [] Supine                       [] Intermittent   [] Continuous Lbs:  []  before manual  []  after manual    []   Ultrasound: [] Continuous   []  Pulsed                           []   [] Location:  W/cm2:    []   Iontophoresis with dexamethasone         Location: []  Take home patch   []  In clinic   10 []   Ice     [x]   heat  []   Ice massage Position: Supine  Location: R hip    []   Laser  []   Other: Position:  Location:    []   Vasopneumatic Device Pressure:       []  lo []  med []  hi   Temperature: []  lo []  med []  hi   [x]  Skin assessment post-treatment:  [x] intact [] redness- no adverse reaction    [] redness ??? adverse reaction:     40 min Therapeutic  Exercise:  [x]  See flow sheet :   Rationale: increase ROM, increase strength, improve coordination, improve balance and increase proprioception to improve the patient???s ability to improve ambulation and transfers with decreased fall risk    15 min Therapeutic Activity:  [x]   See flow sheet : dynamic gait activities (forward, side stepping, retro, eyes closes, around cones, over hurdles), sit to stands   Rationale: improve coordination, improve balance and increase proprioception  to improve the patient???s ability to  ambulate with less fall risk.           With   [x]  TE   []  TA   []  neuro   []  other: Patient Education: [x]  Review HEP    []  Progressed/Changed HEP based on:   []  positioning   []  body mechanics   []  transfers   []  heat/ice application    [x]  other: Educated pt to use heat at home to decrease hip pain.     Other Objective/Functional Measures: No LOB with dynamic gait activities; most difficulty with hurdles. Good technique with exercises.     Pain Level (0-10 scale) post treatment: 0/10    ASSESSMENT/Changes in Function: Pt making good progress in therapy with improving ambulation for decreased fall risk. Pt continuing to make R LE/UE strength gains but continues to have difficulty with stepping over obstacles. Continue progressing balance and ambulation without assistive device for improved independence at home and in the community.    Patient will continue to benefit from skilled PT services to modify and progress therapeutic interventions, address functional mobility deficits, address ROM deficits, address strength deficits, analyze and cue movement patterns, analyze and modify body mechanics/ergonomics, assess and modify postural abnormalities, address imbalance/dizziness and instruct in home and community integration to attain remaining goals.          Progress towards goals / Updated goals:  Short Term Goals: To be accomplished in 2 weeks:   1. Pt will report compliance with HEP to take an active  role in the rehabilitation process for improved activity tolerance and improved functional mobility. Met (06/24/13)  Long Term Goals: To be accomplished in 6 weeks:   1. Pt will demonstrate bridge mod Independently for improved bed mobility.   2. Pt will demonstrate a 14 point improvement on LEFS indicating significant improvement in function.   3. Pt will ambulate on even and uneven surfaces without AD safely demonstrating no signs of imbalance for improved community ambulation.   4. Pt will increase RLE Plantar flexion strength to 4/5 for increased toe off during gait and improved ability to climb stairs.      PLAN  [x]   Upgrade activities as tolerated     [x]   Continue plan of care  []   Update interventions per flow sheet       []   Discharge due to:_  [x]   Other: Add UE exercises on TB for improvement in R arm strength. Work on stepping over higher barriers for carry over to bath tub transfers.     Edmonia Lynch, PTA 06/24/2013  8:07 PM

## 2013-06-29 NOTE — Progress Notes (Signed)
PT DAILY TREATMENT NOTE 8-14    Patient Name: [name] Short  Date:06/29/2013  DOB: 10-29-1955  [x]   Patient DOB Verified  Payor: [employer]  [place] [employer] [place]  Product Type: Managed Care Medicaid     In time:4:37  Out time:5:15  Total Treatment Time (min): 38  Visit #: 5 of 12    Treatment Area: CVA (cerebral infarction) [434.91]    SUBJECTIVE  Pain Level (0-10 scale): 2/10  Any medication changes, allergies to medications, adverse drug reactions, diagnosis change, or new procedure performed?: [x]  No    []  Yes (see summary sheet for update)  Subjective functional status/changes:   []  No changes reported  Pt states R hip felt much better after receiving MH last visit. Pt reports she feels like she is getting stronger but wants to be able to walk/run without her cane as well as climb in and out of her bathtub safely. Pt also notes difficulty lifting with R arm when she is cooking.     OBJECTIVE    13 min Therapeutic Exercise:  [x]  See flow sheet :   Rationale: increase ROM and increase strength to improve the patient???s ability to step over obstacles and enter/exit tub with decreased fall risk    15 min Therapeutic Activity:  [x]   See flow sheet : Dynamic gait activities (toe walking, heel walking, high/low step over obstacles) without cane and sit to stand transfers   Rationale: increase ROM, increase strength, improve coordination, improve balance and increase proprioception  to improve the patient???s ability to safely ambulate at home and in the community and perform transfers     10 min Neuromuscular Re-education:  [x]   See flow sheet : bosu marches, MSR, Fitter board   Rationale: increase ROM, increase strength, improve coordination, improve balance and increase proprioception  to improve the patient???s ability to safely ambulate without assistive device          With   [x]  TE   []  TA   []  neuro   []  other: Patient Education: [x]  Review HEP    []  Progressed/Changed HEP based on:   []  positioning   []  body  mechanics   []  transfers   []  heat/ice application    [x]  other: Educated pt to use soup cans at home for bicep curls to work on increasing R UE strength for ease of lifting and cooking.     Other Objective/Functional Measures: Difficulty with R foot clearance over high hurdles needing Min A to prevent LOB. Noted R leg falling into abduction in hooklying position; added adductor strengthening. Pt appears safe ambulating without cane but still uses it as safety blanket in case R LE feels weak.     Pain Level (0-10 scale) post treatment: 0/10    ASSESSMENT/Changes in Function: Pt making good progress at improving R LE strengthening but still limited with stepping over high obstacles such as entering/exiting bath tub. Pt continues to require cane for ambulating although question the necessity versus the safety pt feels with having it to use. Continue progressing R UE/LE strengthening for improved ability to safely ambulate on varying terrain as well as negotiate stairs and perform ADLs with ease.    Patient will continue to benefit from skilled PT services to modify and progress therapeutic interventions, address functional mobility deficits, address ROM deficits, address strength deficits, analyze and cue movement patterns, address imbalance/dizziness and instruct in home and community integration to attain remaining goals.  Progress towards goals / Updated goals:  Short Term Goals: To be accomplished in 2 weeks:   1. Pt will report compliance with HEP to take an active role in the rehabilitation process for improved activity tolerance and improved functional mobility. Met (06/24/13)   Long Term Goals: To be accomplished in 6 weeks:   1. Pt will demonstrate bridge mod Independently for improved bed mobility.   2. Pt will demonstrate a 14 point improvement on LEFS indicating significant improvement in function.   3. Pt will ambulate on even and uneven surfaces without AD safely demonstrating no signs of  imbalance for improved community ambulation. Progressing (06/29/13)  4. Pt will increase RLE Plantar flexion strength to 4/5 for increased toe off during gait and improved ability to climb stairs.    PLAN  [x]   Upgrade activities as tolerated     [x]   Continue plan of care  []   Update interventions per flow sheet       []   Discharge due to:_  [x]   Other: Add UE strengthening to HEP and provide TB for door to improve ability of performing household chores and cooking.      Edmonia Lynch, PTA 06/29/2013  6:16 PM

## 2013-07-05 NOTE — Progress Notes (Signed)
PT DAILY TREATMENT NOTE 8-14    Patient Name: [name] Short  Date:07/05/2013  DOB: March 26, 1956  [x]   Patient DOB Verified  Payor: [employer]  [place] [employer] [place]  Product Type: Managed Care Medicaid     In time:5:13  Out time:6:18  Total Treatment Time (min): 65  Visit #: 6 of 12    Treatment Area: CVA (cerebral infarction) [434.91]    SUBJECTIVE  Pain Level (0-10 scale): 5/10 L hip  Any medication changes, allergies to medications, adverse drug reactions, diagnosis change, or new procedure performed?: [x]  No    []  Yes (see summary sheet for update)  Subjective functional status/changes:   []  No changes reported  Pt states L hip has been hurting since Thanksgiving when she felt like she pulled a muscle lifting a [place]. Pt feels that her R leg is getting stronger but still has difficulty getting in and out of the bath tub.     OBJECTIVE    Modality rationale: decrease pain and increase tissue extensibility to improve the patient???s ability to ambulate   Min Type Additional Details    []  Estim: [] Att   [] Unatt        [] TENS instruct                  [] IFC  [] Premod   [] NMES                     [] Other:  [] w/US   [] w/ice   [] w/heat  Position:  Location:    []   Traction: []  Cervical       [] Lumbar                       []  Prone          [] Supine                       [] Intermittent   [] Continuous Lbs:  []  before manual  []  after manual    []   Ultrasound: [] Continuous   []  Pulsed                           []   [] Location:  W/cm2:    []   Iontophoresis with dexamethasone         Location: []  Take home patch   []  In clinic   10 []   Ice     [x]   heat  []   Ice massage Position:supine  Location: B hips    []   Laser  []   Other: Position:  Location:    []   Vasopneumatic Device Pressure:       []  lo []  med []  hi   Temperature: []  lo []  med []  hi   [x]  Skin assessment post-treatment:  [x] intact [] redness- no adverse reaction    [] redness ??? adverse reaction:     40 min Therapeutic Exercise:  [x]  See flow sheet :    Rationale: increase ROM and increase strength to improve the patient???s ability to step over obstacles and enter/exit bathtub with decreased fall risk     15 min Neuromuscular Re-education:  [x]   See flow sheet :   Rationale: increase ROM, increase strength, improve coordination, improve balance and increase proprioception  to improve the patient???s ability to safely ambulate without a cane    5 min Gait Training:  _200__ feet with _no__ device on level surfaces with _SBA__ level  of assist   Rationale: Cueing to increase heel strike to decrease fall risk          With   []  TE   []  TA   []  neuro   []  other: Patient Education: [x]  Review HEP    []  Progressed/Changed HEP based on:   []  positioning   []  body mechanics   []  transfers   []  heat/ice application    []  other:      Other Objective/Functional Measures: Initial cueing during gait training for correct heel strike to prevent falls. Moderate sway with EC MSR on foam.      Pain Level (0-10 scale) post treatment: 0/10    ASSESSMENT/Changes in Function: Pt making good progress towards goals for improved strength of R LE. Pt continues to have some difficulty with more challenging balance activities. Pt still limited with stepping over obstacles from weakness which needs to be address to safely enter/exit bath tub at home.     Patient will continue to benefit from skilled PT services to modify and progress therapeutic interventions, address functional mobility deficits, address ROM deficits, address strength deficits, address imbalance/dizziness and instruct in home and community integration to attain remaining goals.          Progress towards goals / Updated goals:  Short Term Goals: To be accomplished in 2 weeks:   1. Pt will report compliance with HEP to take an active role in the rehabilitation process for improved activity tolerance and improved functional mobility. Met (06/24/13)   Long Term Goals: To be accomplished in 6 weeks:   1. Pt will demonstrate bridge mod  Independently for improved bed mobility. Met (07/05/13)  2. Pt will demonstrate a 14 point improvement on LEFS indicating significant improvement in function.   3. Pt will ambulate on even and uneven surfaces without AD safely demonstrating no signs of imbalance for improved community ambulation. Progressing (07/05/13); 200' on carpet with SBA and no cane   4. Pt will increase RLE Plantar flexion strength to 4/5 for increased toe off during gait and improved ability to climb stairs.    PLAN  [x]   Upgrade activities as tolerated     [x]   Continue plan of care  []   Update interventions per flow sheet       []   Discharge due to:_  [x]   Other:Add UE strengthening to HEP and provide TB for door to improve ability of performing household chores and cooking.     Edmonia Lynch, PTA 07/05/2013  6:14 PM

## 2013-07-08 NOTE — Progress Notes (Signed)
PT DAILY TREATMENT NOTE 8-14    Patient Name: Fiana Gladu  Date:07/08/2013  DOB: Nov 11, 1955  [x]   Patient DOB Verified  Payor: [employer]  [place] [employer] [place]  Product Type: Managed Care Medicaid     In time:5:40  Out time:6:35  Total Treatment Time (min): 55  Visit #: 7 of 12    Treatment Area: CVA (cerebral infarction) [434.91]    SUBJECTIVE  Pain Level (0-10 scale): 0/10  Any medication changes, allergies to medications, adverse drug reactions, diagnosis change, or new procedure performed?: [x]  No    []  Yes (see summary sheet for update)  Subjective functional status/changes:   []  No changes reported  Pt states no pain just continues to have a "barbie doll" leg feeling in her hips. Pt states pain is the worst in the morning but gets better when she walks around and thinks it has to do with arthritis. Pt with some discomfort in low back today with walking.    OBJECTIVE    Modality rationale: decrease pain to improve the patient???s ability to ambulate without low back pain.   Min Type Additional Details    []  Estim: [] Att   [] Unatt        [] TENS instruct                  [] IFC  [] Premod   [] NMES                     [] Other:  [] w/US   [] w/ice   [] w/heat  Position:  Location:    []   Traction: []  Cervical       [] Lumbar                       []  Prone          [] Supine                       [] Intermittent   [] Continuous Lbs:  []  before manual  []  after manual    []   Ultrasound: [] Continuous   []  Pulsed                           []   [] Location:  W/cm2:    []   Iontophoresis with dexamethasone         Location: []  Take home patch   []  In clinic   10 []   Ice     [x]   heat  []   Ice massage Position: supine  Location: low back    []   Laser  []   Other: Position:  Location:    []   Vasopneumatic Device Pressure:       []  lo []  med []  hi   Temperature: []  lo []  med []  hi   [x]  Skin assessment post-treatment:  [x] intact [] redness- no adverse reaction    [] redness ??? adverse reaction:     45 min Therapeutic  Exercise:  [x]  See flow sheet :   Rationale: increase ROM and increase strength to improve the patient???s ability to enter/exit bathtub with decreased fall risk          With   []  TE   []  TA   []  neuro   []  other: Patient Education: [x]  Review HEP    []  Progressed/Changed HEP based on:   []  positioning   []  body mechanics   []  transfers   []  heat/ice application    []   other:      Other Objective/Functional Measures: Cueing with new exercises for correct technique. Slight increased back pain due to forward posture with squats. Min A for SLR. Increased fatigue today throughout session.      Pain Level (0-10 scale) post treatment: 0/10    ASSESSMENT/Changes in Function: Pt continues to make slow, steady progress towards goals. Pt with decreased hip flexor strength limiting safe ability to enter/exit bath tub. Pt continues to have increased fatigue throughout sessions but is motivated to get better and stronger. Pt able to ambulate around clinic between exercises without use of assistive device and no signs of imbalance.     Patient will continue to benefit from skilled PT services to modify and progress therapeutic interventions, address functional mobility deficits, address ROM deficits, address strength deficits, analyze and address soft tissue restrictions, analyze and cue movement patterns, analyze and modify body mechanics/ergonomics, assess and modify postural abnormalities, address imbalance/dizziness and instruct in home and community integration to attain remaining goals.          Progress towards goals / Updated goals:  Short Term Goals: To be accomplished in 2 weeks:   1. Pt will report compliance with HEP to take an active role in the rehabilitation process for improved activity tolerance and improved functional mobility. Met (06/24/13)   Long Term Goals: To be accomplished in 6 weeks:   1. Pt will demonstrate bridge mod Independently for improved bed mobility. Met (07/05/13)   2. Pt will demonstrate a 14  point improvement on LEFS indicating significant improvement in function.   3. Pt will ambulate on even and uneven surfaces without AD safely demonstrating no signs of imbalance for improved community ambulation. Progressing (07/05/13); 200' on carpet with SBA and no cane   4. Pt will increase RLE Plantar flexion strength to 4/5 for increased toe off during gait and improved ability to climb stairs.    PLAN  [x]   Upgrade activities as tolerated     [x]   Continue plan of care  []   Update interventions per flow sheet       []   Discharge due to:_  [x]   Other: Work on stepping over obstacles to decrease fall risk.      Edmonia Lynch, PTA 07/08/2013  5:48 PM

## 2013-07-11 ENCOUNTER — Other Ambulatory Visit: Payer: Self-pay | Admitting: *Deleted

## 2013-07-11 DIAGNOSIS — M1990 Unspecified osteoarthritis, unspecified site: Secondary | ICD-10-CM

## 2013-07-11 DIAGNOSIS — I509 Heart failure, unspecified: Secondary | ICD-10-CM

## 2013-07-11 MED ORDER — ENALAPRIL MALEATE 20 MG PO TABS: 20.0000 mg | ORAL_TABLET | Freq: Two times a day (BID) | ORAL | Status: DC

## 2013-07-11 NOTE — Progress Notes (Signed)
PT DAILY TREATMENT NOTE 8-14    Patient Name: [name] Short  Date:07/11/2013  DOB: [birth year]  [x]   Patient DOB Verified  Payor: [employer]  [place] [employer]  Product Type: Managed Care Medicaid     In time:5:37  Out time:6:30  Total Treatment Time (min): 53  Visit #: 8 of 12    Treatment Area: CVA (cerebral infarction) [434.91]    SUBJECTIVE  Pain Level (0-10 scale): 0/10  Any medication changes, allergies to medications, adverse drug reactions, diagnosis change, or new procedure performed?: [x]  No    []  Yes (see summary sheet for update)  Subjective functional status/changes:   []  No changes reported  Pt states no current pain but that R shoulder is a little sore from using the combo bike last session. Pt feels that she is getting stronger but is still having difficulty walking faster because she feels that the R leg fatigues quickly and drags somewhat. Pt's ultimate goal is to be able to run if she needs to and walk her dog quickly.     OBJECTIVE    Modality rationale: decrease pain and increase tissue extensibility to improve the patient???s ability to tolerate ADLs   Min Type Additional Details    []  Estim: [] Att   [] Unatt        [] TENS instruct                  [] IFC  [] Premod   [] NMES                     [] Other:  [] w/US   [] w/ice   [] w/heat  Position:  Location:    []   Traction: []  Cervical       [] Lumbar                       []  Prone          [] Supine                       [] Intermittent   [] Continuous Lbs:  []  before manual  []  after manual    []   Ultrasound: [] Continuous   []  Pulsed                           []   [] Location:  W/cm2:    []   Iontophoresis with dexamethasone         Location: []  Take home patch   []  In clinic   10 []   Ice     [x]   heat  []   Ice massage Position:seated  Location:low back; R shoulder    []   Laser  []   Other: Position:  Location:    []   Vasopneumatic Device Pressure:       []  lo []  med []  hi   Temperature: []  lo []  med []  hi   [x]  Skin assessment  post-treatment:  [x] intact [] redness- no adverse reaction    [] redness ??? adverse reaction:     33 min Therapeutic Exercise:  [x]  See flow sheet :   Rationale: increase ROM, increase strength and improve coordination to improve the patient???s ability to improve endurance of R LE to ambulate quickly and safely    10 min Therapeutic Activity:  [x]   See flow sheet : dynamic gait (speed walking, low hurdles, balance ball on cone walking for distraction)   Rationale: improve coordination, improve balance and  increase proprioception  to improve the patient???s ability to decrease fall risk when ambulating quickly or stepping over obstacles.           With   []  TE   []  TA   []  neuro   []  other: Patient Education: [x]  Review HEP    []  Progressed/Changed HEP based on:   []  positioning   []  body mechanics   []  transfers   []  heat/ice application    []  other:      Other Objective/Functional Measures: No assistance needed for SLR today. Increased resistance/repetitions tolerated without pain/difficulty.  Good ability of negotiating hurdles without LOB, but increased R LE fatigue with speed walking.     Pain Level (0-10 scale) post treatment: 0/10    ASSESSMENT/Changes in Function: Pt with improving strength evidenced by ability to perform SLR without assistance. Pt continues to have decreased R LE endurance needed for safety with fast walking and negotiation of obstacles. Pt able to safely ambulate without use of cane initially, but still requires it during times of fatigue.     Patient will continue to benefit from skilled PT services to modify and progress therapeutic interventions, address functional mobility deficits, address ROM deficits, address strength deficits, analyze and cue movement patterns, analyze and modify body mechanics/ergonomics, address imbalance/dizziness and instruct in home and community integration to attain remaining goals.         Progress towards goals / Updated goals:  Short Term Goals: To be  accomplished in 2 weeks:   1. Pt will report compliance with HEP to take an active role in the rehabilitation process for improved activity tolerance and improved functional mobility. Met (06/24/13)   Long Term Goals: To be accomplished in 6 weeks:   1. Pt will demonstrate bridge mod Independently for improved bed mobility. Met (07/05/13)   2. Pt will demonstrate a 14 point improvement on LEFS indicating significant improvement in function.   3. Pt will ambulate on even and uneven surfaces without AD safely demonstrating no signs of imbalance for improved community ambulation. Progressing (07/11/13); fast walking/stepping over obstacles with CGA and no assistive device  4. Pt will increase RLE Plantar flexion strength to 4/5 for increased toe off during gait and improved ability to climb stairs.      PLAN  [x]   Upgrade activities as tolerated     [x]   Continue plan of care  []   Update interventions per flow sheet       []   Discharge due to:_  [x]   Other: Have pt fill out LEFS.  Modify hip x 4 into steamboats to improve hip endurance/stability.     Edmonia Lynch, PTA 07/11/2013  7:13 PM

## 2013-07-12 NOTE — Progress Notes (Signed)
PT DAILY TREATMENT NOTE 8-14    Patient Name: [name] Short  Date:07/12/2013  DOB: Jan 04, 1956  [x]   Patient DOB Verified  Payor: [employer]  [place] [employer] [place]  Product Type: Managed Care Medicaid     In time:5:43  Out time:6:40  Total Treatment Time (min): 57  Visit #: 9 of 12    Treatment Area: CVA (cerebral infarction) [434.91]    SUBJECTIVE  Pain Level (0-10 scale): 1/10  Any medication changes, allergies to medications, adverse drug reactions, diagnosis change, or new procedure performed?: [x]  No    []  Yes (see summary sheet for update)  Subjective functional status/changes:   []  No changes reported  Pt states she is still feeling stronger but her R leg feels extra heavy today from her workout yesterday and she is tired. Pt reports a little soreness in her R hip today likely due to the weather. 13 minutes late to appointment.     OBJECTIVE    Modality rationale: decrease pain to improve the patient???s ability to ambulate    Min Type Additional Details    []  Estim: [] Att   [] Unatt        [] TENS instruct                  [] IFC  [] Premod   [] NMES                     [] Other:  [] w/US   [] w/ice   [] w/heat  Position:  Location:    []   Traction: []  Cervical       [] Lumbar                       []  Prone          [] Supine                       [] Intermittent   [] Continuous Lbs:  []  before manual  []  after manual    []   Ultrasound: [] Continuous   []  Pulsed                           []   [] Location:  W/cm2:    []   Iontophoresis with dexamethasone         Location: []  Take home patch   []  In clinic   10 []   Ice     [x]   heat  []   Ice massage Position:supine  Location: R hip    []   Laser  []   Other: Position:  Location:    []   Vasopneumatic Device Pressure:       []  lo []  med []  hi   Temperature: []  lo []  med []  hi   [x]  Skin assessment post-treatment:  [x] intact [] redness- no adverse reaction    [] redness ??? adverse reaction:     47 min Therapeutic Exercise:  [x]  See flow sheet :   Rationale: increase ROM,  increase strength and improve coordination to improve the patient???s ability to improve R LE endurance for safety with ambulation          With   []  TE   []  TA   []  neuro   []  other: Patient Education: [x]  Review HEP    []  Progressed/Changed HEP based on:   []  positioning   []  body mechanics   []  transfers   []  heat/ice application    []  other:  Other Objective/Functional Measures: Pt progressing well with exercises and ready for increased resistance/repetitions. Increased difficulty with SLS HR due to weakness; might add toe walking to dynamic gait activity next visit.     Pain Level (0-10 scale) post treatment: 0/10    ASSESSMENT/Changes in Function: Pt making good progress towards goals of improving strength but still with increased weakness in R calf musculature preventing ease of SLS HR. Pt continues to have difficulty with speed of ambulation when trying to walk her dog and R LE endurance to prevent dragging with fatigue.     Patient will continue to benefit from skilled PT services to modify and progress therapeutic interventions, address functional mobility deficits, address ROM deficits and address strength deficits to attain remaining goals.          Progress towards goals / Updated goals:  Short Term Goals: To be accomplished in 2 weeks:   1. Pt will report compliance with HEP to take an active role in the rehabilitation process for improved activity tolerance and improved functional mobility. Met (06/24/13)   Long Term Goals: To be accomplished in 6 weeks:   1. Pt will demonstrate bridge mod Independently for improved bed mobility. Met (07/05/13)   2. Pt will demonstrate a 14 point improvement on LEFS indicating significant improvement in function.   3. Pt will ambulate on even and uneven surfaces without AD safely demonstrating no signs of imbalance for improved community ambulation. Progressing (07/11/13); fast walking/stepping over obstacles with CGA and no assistive device   4. Pt will increase RLE  Plantar flexion strength to 4/5 for increased toe off during gait and improved ability to climb stairs.    PLAN  [x]   Upgrade activities as tolerated     [x]   Continue plan of care  []   Update interventions per flow sheet       []   Discharge due to:_  [x]   Other: Complete DGI, LEFS, DASH, and assess PF strength to write PN. Modify hip x 4 into steamboats to improve hip endurance/stability       Edmonia Lynch, PTA 07/12/2013  7:24 PM

## 2013-07-13 LAB — SENTARA SPECIMEN COLLN.

## 2013-07-22 NOTE — Progress Notes (Signed)
PT DAILY TREATMENT NOTE 8-14    Patient Name: [name] Short  Date:07/22/2013  DOB: [birth year]  [x]   Patient DOB Verified  Payor: [employer]  [place] [employer] [place]  Product Type: Managed Care Medicaid     In time:5:45  Out time:6:30  Total Treatment Time (min): 45  Visit #: 10 of 12    Treatment Area: CVA (cerebral infarction) [434.91]    SUBJECTIVE  Pain Level (0-10 scale): 3/10  Any medication changes, allergies to medications, adverse drug reactions, diagnosis change, or new procedure performed?: [x]  No    []  Yes (see summary sheet for update)  Subjective functional status/changes:   []  No changes reported  Pt reports increased R knee pain today after falling two days ago when walking without her cane to step off of curb onto wet grass and twisting her R ankle. Pt reports she did not notify MD even though she is currently taking plavix. Pt states she is feeling much better today. Pt states main goals in therapy would be to return to running and less reliance on cane with walking.    OBJECTIVE    Modality rationale: decrease inflammation and decrease pain to improve the patient???s ability to tolerate ambulation   Min Type Additional Details    []  Estim: [] Att   [] Unatt        [] TENS instruct                  [] IFC  [] Premod   [] NMES                     [] Other:  [] w/US   [] w/ice   [] w/heat  Position:  Location:    []   Traction: []  Cervical       [] Lumbar                       []  Prone          [] Supine                       [] Intermittent   [] Continuous Lbs:  []  before manual  []  after manual    []   Ultrasound: [] Continuous   []  Pulsed                           []   [] Location:  W/cm2:    []   Iontophoresis with dexamethasone         Location: []  Take home patch   []  In clinic   10 [x]   Ice     []   heat  []   Ice massage Position:supine  Location: R knee    []   Laser  []   Other: Position:  Location:    []   Vasopneumatic Device Pressure:       []  lo []  med []  hi   Temperature: []  lo []  med []  hi   [x]   Skin assessment post-treatment:  [x] intact [] redness- no adverse reaction    [] redness ??? adverse reaction:     35 min Therapeutic Exercise:  [x]  See flow sheet :   Rationale: increase strength to improve the patient???s ability to improve R UE strength.          With   [x]  TE   []  TA   []  neuro   []  other: Patient Education: [x]  Review HEP    []  Progressed/Changed HEP based on:   []   positioning   []  body mechanics   []  transfers   []  heat/ice application    [x]  other: Instructed pt to notify MD of fall on Monday due to pt currently on plavix.      Other Objective/Functional Measures: Pt did not admit to a fall until after beginning R LE exercises in which she was having increased difficulty/pain with SLS on R. Educated pt that we would do light UE exercises but that she needed to contact MD immediately to see if she should be seen due to fall. No apparent bruising and one scabbed area over knee cap. Moderate R ankle swelling which pt says is constant.  LEFS: 48/80; Quick Dash: 43.18%. Unable to assess DGI and R PF strength.    Pain Level (0-10 scale) post treatment: numb    ASSESSMENT/Changes in Function: Pt making good progress towards goals of increased R LE strength despite minor setback with recent fall. Pt continues to have decreased R LE endurance and balance which increases risks for falls with ambulation on uneven terrain.     Patient will continue to benefit from skilled PT services to modify and progress therapeutic interventions, address functional mobility deficits, address ROM deficits, address strength deficits, analyze and address soft tissue restrictions, analyze and cue movement patterns, analyze and modify body mechanics/ergonomics, assess and modify postural abnormalities, address imbalance/dizziness and instruct in home and community integration to attain remaining goals.     Progress towards goals / Updated goals:  Short Term Goals: To be accomplished in 2 weeks:   1. Pt will report compliance  with HEP to take an active role in the rehabilitation process for improved activity tolerance and improved functional mobility. Met (06/24/13)   Long Term Goals: To be accomplished in 6 weeks:   1. Pt will demonstrate bridge mod Independently for improved bed mobility. Met (07/05/13)   2. Pt will demonstrate a 14 point improvement on LEFS indicating significant improvement in function. Met; improved by 30 points   3. Pt will ambulate on even and uneven surfaces without AD safely demonstrating no signs of imbalance for improved community ambulation. Progressing (07/11/13); fast walking/stepping over obstacles with CGA and no assistive device   4. Pt will increase RLE Plantar flexion strength to 4/5 for increased toe off during gait and improved ability to climb stairs.Progressing      PLAN  [x]   Upgrade activities as tolerated     [x]   Continue plan of care  []   Update interventions per flow sheet       []   Discharge due to:_  [x]   Other:Modify hip x 4 into steamboats to improve hip endurance/stability       Edmonia Lynch, PTA 07/22/2013  6:13 PM

## 2013-07-25 NOTE — Progress Notes (Signed)
[facility] ??? [facility]  [address]  [place], [place] [address]  [phone]  [phone] fax    Physical Therapy Progress Note  Patient name: [name] Short Start of Care: 06/13/13   Referral source: Marissa Nestle.,* DOB: Jul 09, 1956   Medical/Treatment Diagnosis: CVA (cerebral infarction) [434.91] Onset Date: 04/23/13     Prior Hospitalization: see medical history Provider#: [id number]   Medications: Verified on Patient Summary List    Comorbidities:  Diabetes, arthritis, HTN  Prior Level of Function: Ind with all ADLs and functional activities  Visits from Start of Care: 10    Missed Visits: 1    Established Goals:         Excellent           Good         Limited           None  [x]  Increased ROM   []   []   [x]   []   [x]  Increased Strength  []   []   [x]   []   [x]  Increased Mobility  []   []   [x]   []    [x]  Decreased Pain   []   []   [x]   []   []  Decreased Swelling  []   []   []   []     Key Functional Changes:  Pt. Is progressing towards goals. She is now able to perform bridges and LEFS improved by 30 points indicating a significant improvement in function. She continues to demonstrate PF strength and continues to be at risk of falling. She had a fall on 07/20/13 when she was stepping off a curb and was encouraged to contact physician about her fall.     Updated Goals: to be achieved in 4 weeks:  1. Patient will increase LEFS by 14 points in order to demonstrate improved function.  2. Pt will ambulate on even and uneven surfaces without AD safely demonstrating no signs of imbalance for improved community ambulation.  3. Pt will increase RLE Plantar flexion strength to 4/5 for increased toe off during gait and improved ability to climb stairs.    ASSESSMENT/RECOMMENDATIONS:  [x] Continue therapy per initial plan/protocol at a frequency of  2-3 x per week for 4 weeks  [] Continue therapy with the following recommended changes:_____________________       _____________________________________________________________________  [] Discontinue therapy progressing towards or have reached established goals  [] Discontinue therapy due to lack of appreciable progress towards goals  [] Discontinue therapy due to lack of attendance or compliance  [] Await Physician's recommendations/decisions regarding therapy  [] Other:________________________________________________________________    Thank you for this referral.   Sena Hitch, PT 07/25/2013 8:36 AM    NOTE TO PHYSICIAN:  PLEASE COMPLETE THE ORDERS BELOW AND   FAX TO [facility]: [phone]  If you are unable to process this request in 24 hours please contact our office: [phone]      I have read the above report and request that my patient continue as recommended.    I have read the above report and request that my patient continue therapy with the following changes/special instructions:____________________________________    I have read the above report and request that my patient be discharged from therapy.    Physician???s signature: ______________________________Date: ______Time:______

## 2013-08-02 NOTE — Progress Notes (Signed)
PT DAILY TREATMENT NOTE 8-14    Patient Name: [name] Short  Date:08/02/2013  DOB: May 18, 1956  [x]   Patient DOB Verified  Payor: [employer]  [place] [employer]  Product Type: Managed Care Medicaid     In time:4:38  Out time:5:18  Total Treatment Time (min): 40  Visit #: 11 of 12    Treatment Area: CVA (cerebral infarction) [434.91]    SUBJECTIVE  Pain Level (0-10 scale): 2/10 R knee  Any medication changes, allergies to medications, adverse drug reactions, diagnosis change, or new procedure performed?: [x]  No    []  Yes (see summary sheet for update)  Subjective functional status/changes:   []  No changes reported  Pt states knee is starting to feel better since her last fall but is still a little sore. Pt wants to continue working on balance and strength so she is able to safely run and step over obstacles without falling.     OBJECTIVE    30 min Therapeutic Exercise:  [x]  See flow sheet :   Rationale: increase ROM and increase strength to improve the patient???s ability to run and negotiate obstacles with decreased fall risk    10 min Neuromuscular Re-education:  []   See flow sheet : dynamic gait (fast walking, stepping over high/low obstacles), tandem balance   Rationale: increase strength, improve coordination, improve balance and increase proprioception  to improve the patient???s ability to decrease fall risk with running and obstacle negotiation          With   []  TE   []  TA   []  neuro   []  other: Patient Education: [x]  Review HEP    []  Progressed/Changed HEP based on:   []  positioning   []  body mechanics   []  transfers   []  heat/ice application    []  other:      Other Objective/Functional Measures: Good ability to fast walk with CGA; noted minor R LE fatigue afterwards with increased R foot shuffling. Pt continues to have difficulty with R hip flexion to step over high obstacles.      Pain Level (0-10 scale) post treatment: 2/10    ASSESSMENT/Changes in Function: Pt making progress towards goals of  improved balance for safety with ambulating. Pt continues to have increased R LE fatigue with exercises and requires more work on endurance. Will continue to focus on strengthening pt for decreased fall risk while we progress towards pt's goals of running and ease of negotiating obstacles.     Patient will continue to benefit from skilled PT services to modify and progress therapeutic interventions, address functional mobility deficits, address ROM deficits, address strength deficits, analyze and cue movement patterns, analyze and modify body mechanics/ergonomics, assess and modify postural abnormalities, address imbalance/dizziness and instruct in home and community integration to attain remaining goals.          Progress towards goals / Updated goals:  1. Patient will increase LEFS by 14 points in order to demonstrate improved function.   2. Pt will ambulate on even and uneven surfaces without AD safely demonstrating no signs of imbalance for improved community ambulation.   3. Pt will increase RLE Plantar flexion strength to 4/5 for increased toe off during gait and improved ability to climb stairs.    PLAN  [x]   Upgrade activities as tolerated     [x]   Continue plan of care  []   Update interventions per flow sheet       []   Discharge due to:_  [x]   Other: Add HS stretching for improve hip flexion ability      Edmonia Lynch, PTA 08/02/2013  7:23 PM

## 2013-08-05 NOTE — Progress Notes (Signed)
PT DAILY TREATMENT NOTE 8-14    Patient Name: [name] Short  Date:08/05/2013  DOB: 1956/01/20  [x]   Patient DOB Verified  Payor[place] [employer]  [place]  Product Type: Managed Care Medicaid     In time:5:29  Out time:6:10  Total Treatment Time (min): 41  Visit #: 12 of 12    Treatment Area: CVA (cerebral infarction) [434.91]    SUBJECTIVE  Pain Level (0-10 scale): 0/10  Any medication changes, allergies to medications, adverse drug reactions, diagnosis change, or new procedure performed?: [x]  No    []  Yes (see summary sheet for update)  Subjective functional status/changes:   []  No changes reported  Pt states knee is feeling better but still just a little sore since her fall. Pt wants to continue strengthening to decrease a chance of future falls. Pt's self goal is to run.    OBJECTIVE    31 min Therapeutic Exercise:  [x]  See flow sheet :   Rationale: increase ROM and increase strength to improve the patient???s ability to decrease fall risk with ambulation    10 min Therapeutic Activity:  [x]   See flow sheet : dynamic gait for preparation of running activities -high knee walking, toe walking, fast walking   Rationale: increase ROM, increase strength, improve coordination, improve balance and increase proprioception  to improve the patient???s ability to progress to running to meet self goal           With   []  TE   []  TA   []  neuro   []  other: Patient Education: [x]  Review HEP    []  Progressed/Changed HEP based on:   []  positioning   []  body mechanics   []  transfers   []  heat/ice application    []  other:      Other Objective/Functional Measures: Increased fatigue by end of session evidenced by increased shuffling during ambulation. Continues to have increased weakness in R LE.     Pain Level (0-10 scale) post treatment: 0/10    ASSESSMENT/Changes in Function: Pt making progress in therapy with increased R LE strength but continues to have fatigue by end of sessions. Pt continues to require cane when  walking her dog to help with balance. Will continue to progress per pt tolerance for increased strength/endurance to decrease fall risk.     Patient will continue to benefit from skilled PT services to modify and progress therapeutic interventions, address functional mobility deficits, address ROM deficits, address strength deficits, analyze and address soft tissue restrictions, analyze and cue movement patterns, analyze and modify body mechanics/ergonomics, assess and modify postural abnormalities, address imbalance/dizziness and instruct in home and community integration to attain remaining goals.          Progress towards goals / Updated goals:  1. Patient will increase LEFS by 14 points in order to demonstrate improved function.   2. Pt will ambulate on even and uneven surfaces without AD safely demonstrating no signs of imbalance for improved community ambulation.   3. Pt will increase RLE Plantar flexion strength to 4/5 for increased toe off during gait and improved ability to climb stairs.    PLAN  [x]   Upgrade activities as tolerated     [x]   Continue plan of care  []   Update interventions per flow sheet       []   Discharge due to:_  [x]   Other: Work on endurance versus strengthening for decreased fall risk.       Edmonia Lynch, PTA  08/05/2013  6:09 PM

## 2013-08-09 NOTE — Progress Notes (Signed)
PT DAILY TREATMENT NOTE 8-14    Patient Name: [name] AppleHelena Short  Date:08/09/2013  DOB: 1955/11/18  [x]   Patient DOB Verified  Payor: [employer]  [place]  Product Type: Managed Care Medicaid     In time:5:35  Out time: 6:15  Total Treatment Time (min): 40  Visit #: 1 of 8-12    Treatment Area: CVA (cerebral infarction) [434.91]    SUBJECTIVE  Pain Level (0-10 scale): 0/10  Any medication changes, allergies to medications, adverse drug reactions, diagnosis change, or new procedure performed?: [x]  No    []  Yes (see summary sheet for update)  Subjective functional status/changes:   []  No changes reported  Pt states she feels like she is getting stronger and having to use her cane less with therapy. Pt is still concerned about tripping and climbing stairs at work. Pt notes increased fatigue when walking her dog that makes her feet shuffle.     OBJECTIVE    40 min Therapeutic Exercise:  [x]  See flow sheet :   Rationale: increase ROM and increase strength to improve the patient???s ability to ambulate with decreased fall risk         With   []  TE   []  TA   []  neuro   []  other: Patient Education: [x]  Review HEP    []  Progressed/Changed HEP based on:   []  positioning   []  body mechanics   []  transfers   []  heat/ice application    []  other:      Other Objective/Functional Measures: Good tolerance of new exercises aimed at increasing LE strength. Increased fatigue with DF TB exercises. Cueing with S/L abduction to prevent TFL substitution. Able to perform sit to stands well with L foot out in front with lowest table setting.     Pain Level (0-10 scale) post treatment: 0/10    ASSESSMENT/Changes in Function: Pt progressing well in therapy with increased strength. Pt continues to have increased fatigue with activity increasing her risk for falls. Pt requiring less use of her cane with ambulation as she is improving with R LE balance and strength. Will continue to progress per pt tolerance for improved strength and  decreased fall risk.     Patient will continue to benefit from skilled PT services to modify and progress therapeutic interventions, address functional mobility deficits, address ROM deficits, address strength deficits, analyze and address soft tissue restrictions, analyze and cue movement patterns, analyze and modify body mechanics/ergonomics, assess and modify postural abnormalities, address imbalance/dizziness and instruct in home and community integration to attain remaining goals.         Progress towards goals / Updated goals:  1. Patient will increase LEFS by 14 points in order to demonstrate improved function.   2. Pt will ambulate on even and uneven surfaces without AD safely demonstrating no signs of imbalance for improved community ambulation.   3. Pt will increase RLE Plantar flexion strength to 4/5 for increased toe off during gait and improved ability to climb stairs.    PLAN  [x]   Upgrade activities as tolerated     [x]   Continue plan of care  []   Update interventions per flow sheet       []   Discharge due to:_  [x]   Other: Add elliptical for warm up and work on stair negotiation for improved strength and endurance.       Edmonia LynchLaura M Wilkinson, PTA 08/09/2013  6:51 PM

## 2013-08-18 ENCOUNTER — Telehealth: Payer: Self-pay | Admitting: Licensed Clinical Social Worker

## 2013-08-18 NOTE — Telephone Encounter (Signed)
CSW returned call from Ms. Rick Duff.  Pt states with arthritis flare ups it is becoming more difficult to be home alone.  Pt states at times she can not do anything for herself and most wait until her pain resolves.  CSW informed Ms. Seybold will send request to PCP for review.  Should PCP sign off on request, it is faxed to [facility].  Zap contact pt to schedule an assessment to determine eligibility.  Pt aware and informed to notified of potential time frame.

## 2013-08-18 NOTE — Telephone Encounter (Signed)
CSW received PCS request from [facility].  CSW placed called to pt.  CSW left message requesting return call. CSW provided contact hours and phone number.  During pt's most recent visit, pt states she was independent.  CSW will inquire about needs with ADL's.

## 2013-08-19 NOTE — Progress Notes (Signed)
PT DAILY TREATMENT NOTE - MCR 8-14    Patient Name: [name] AppleHelena Short  Date:08/19/2013  DOB: 1956/04/09  [x]   Patient DOB Verified  Payor: [employer]  [place]  Product Type: Managed Care Medicaid     In time: 5:00  Out time: 5:30  Total Treatment Time (min):  30  Total Timed Codes (min):  30     Visit #: 2 of  8-12    Treatment Area: CVA (cerebral infarction) [434.91]    SUBJECTIVE  Pain Level (0-10 scale): 0/10  Any medication changes, allergies to medications, adverse drug reactions, diagnosis change, or new procedure performed?: [x]  No    []  Yes (see summary sheet for update)  Subjective functional status/changes:   []  No changes reported  Pt. Reports she only has difficulty stepping over junk in her house.     OBJECTIVE      30 min Therapeutic Activity:  [x]   See flow sheet : outdoor ambulation with no assistive device and SBA. Ambulated on pavement, gravel, and grass on incline/decline. Ascended/descended curbs with no LOB. Fwd and side stepping over large hurdles   Rationale: increase ROM, increase strength, improve coordination and improve balance  to improve the patient???s ability to ambulate           With   []  TE   [x]  TA   []  neuro   []  other: Patient Education: [x]  Review HEP    []  Progressed/Changed HEP based on:   []  positioning   []  body mechanics   []  transfers   []  heat/ice application    []  other:      Other Objective/Functional Measures:   Pt. Had no difficulty with ambulation on different surfaces and no difficulty with curb negotiation.   She was challenged with stepping over large hurdles.      Pain Level (0-10 scale) post treatment:  0/10    ASSESSMENT/Changes in Function: Pt. Is progressing well with physical therapy. She continues to demonstrate difficulty with single leg stability stepping over objects.     Patient will continue to benefit from skilled PT services to modify and progress therapeutic interventions, address functional mobility deficits, address ROM deficits,  address strength deficits, analyze and address soft tissue restrictions, analyze and cue movement patterns, analyze and modify body mechanics/ergonomics, assess and modify postural abnormalities and instruct in home and community integration to attain remaining goals.         Progress towards goals / Updated goals:  1. Patient will increase LEFS by 14 points in order to demonstrate improved function.   2. Pt will ambulate on even and uneven surfaces without AD safely demonstrating no signs of imbalance for improved community ambulation.   Progressing: pt. Ambulated on even and uneven surfaces without assistive device and SBA  3. Pt will increase RLE Plantar flexion strength to 4/5 for increased toe off during gait and improved ability to climb stairs.       PLAN  []   Upgrade activities as tolerated     [x]   Continue plan of care  []   Update interventions per flow sheet       []   Discharge due to:_  []   Other:_      Sena HitchStephen Aimi Essner, PT 08/19/2013  7:25 PM

## 2013-08-22 NOTE — Progress Notes (Signed)
PT DAILY TREATMENT NOTE 8-14    Patient Name: [name] Short  Date:08/22/2013  DOB: 05/20/1956  [x]   Patient DOB Verified  Payor: [employer] MEDICA[employer]  [place]  Product Type: Managed Care Medicaid     In time:3:36  Out time:4:00  Total Treatment Time (min): 24  Visit #: 3 of 8-12    Treatment Area: CVA (cerebral infarction) [434.91]    SUBJECTIVE  Pain Level (0-10 scale): 0/10  Any medication changes, allergies to medications, adverse drug reactions, diagnosis change, or new procedure performed?: [x]  No    []  Yes (see summary sheet for update)  Subjective functional status/changes:   []  No changes reported  Pt states no pain today but still increased difficulty with stepping over things in her house. Pt wants to continue working towards goal of running but feels her R leg gets tired easily.     OBJECTIVE    14 min Therapeutic Exercise:  [x]  See flow sheet :   Rationale: increase ROM and increase strength to improve the patient???s ability to ambulate    10 min Therapeutic Activity:  [x]   See flow sheet : fast walking, high/low hurdles   Rationale: increase ROM, increase strength, improve coordination, improve balance and increase proprioception  to improve the patient???s ability to decrease fall risk         With   []  TE   []  TA   []  neuro   []  other: Patient Education: [x]  Review HEP    []  Progressed/Changed HEP based on:   []  positioning   []  body mechanics   []  transfers   []  heat/ice application    []  other:      Other Objective/Functional Measures: No LOB with stepping over high/low hurdles today but with increased fatigue.      Pain Level (0-10 scale) post treatment: 0/10    ASSESSMENT/Changes in Function: Pt making good progress with improved strength in physical therapy but continues to be challenged with endurance of R LE. Pt improving with ambulation but still has difficulty with stepping over obstacles. Will continue progressing stability and balance for decreased fall risk.     Patient will continue  to benefit from skilled PT services to modify and progress therapeutic interventions, address functional mobility deficits, address ROM deficits, address strength deficits, analyze and address soft tissue restrictions, analyze and cue movement patterns, analyze and modify body mechanics/ergonomics, assess and modify postural abnormalities and instruct in home and community integration to attain remaining goals.         Progress towards goals / Updated goals:  1. Patient will increase LEFS by 14 points in order to demonstrate improved function.   2. Pt will ambulate on even and uneven surfaces without AD safely demonstrating no signs of imbalance for improved community ambulation.   Progressing: pt. Ambulated on even and uneven surfaces without assistive device and SBA   3. Pt will increase RLE Plantar flexion strength to 4/5 for increased toe off during gait and improved ability to climb stairs.     PLAN  [x]   Upgrade activities as tolerated     [x]   Continue plan of care  []   Update interventions per flow sheet       []   Discharge due to:_  []   Other:_      [name] Short, PTA 08/22/2013  6:41 PM

## 2013-08-30 ENCOUNTER — Telehealth: Payer: Self-pay | Admitting: Internal Medicine

## 2013-08-30 NOTE — Progress Notes (Signed)
PT DAILY TREATMENT NOTE 8-14    Patient Name: [name] Short  Date:08/30/2013  DOB: 23-Apr-1956  [x]   Patient DOB Verified  Payor: [employer] [employer]  [place]  Product Type: Managed Care Medicaid     In time:5:30  Out time:6:00  Total Treatment Time (min): 30  Visit #: 3 of 8-12    Treatment Area: CVA (cerebral infarction) [434.91]    SUBJECTIVE  Pain Level (0-10 scale): 0/10  Any medication changes, allergies to medications, adverse drug reactions, diagnosis change, or new procedure performed?: [x]  No    []  Yes (see summary sheet for update)  Subjective functional status/changes:   []  No changes reported  Pt reports no pain just aching. Pt fell last week at work after tripping over her cane which she reports she saw and landed on her R knee. Pt states she does some exercises at work but not all of them.     OBJECTIVE    15 min Therapeutic Exercise:  [x]  See flow sheet :   Rationale: increase ROM and increase strength to improve the patient???s ability to ambulate and step over obstacles with decreased fall risk    15 min Therapeutic Activity:  [x]   See flow sheet : light jogging with CGA, stepping over foam, small hurdles and other objects that could pose a fall risk, high knee walking, elliptical for transition to working out at a gym   Rationale: increase ROM, increase strength, improve coordination, improve balance and increase proprioception  to improve the patient???s ability to decrease fall risk           With   []  TE   []  TA   []  neuro   []  other: Patient Education: [x]  Review HEP    []  Progressed/Changed HEP based on:   []  positioning   []  body mechanics   []  transfers   []  heat/ice application    []  other:      Other Objective/Functional Measures: No LOB with obstacles and supervision. CGA with light jogging and cueing to pick up feet. Still increased fatigue on R LE by end of session.      Pain Level (0-10 scale) post treatment: 0/10    ASSESSMENT/Changes in Function: pt making steady progress  towards final goals prior to discharge. Pt improving with ambulation over obstacles requiring less assistance or verbal cueing. Pt continues to have decreased endurance of R LE but is compliant with home exercises for continued strengthening. Will continue progressing for final sessions for independence with home management to further strengthen for decreased fall risk with ambulation.     Patient will continue to benefit from skilled PT services to modify and progress therapeutic interventions, address functional mobility deficits, address ROM deficits, address strength deficits, analyze and address soft tissue restrictions, analyze and cue movement patterns, analyze and modify body mechanics/ergonomics, assess and modify postural abnormalities, address imbalance/dizziness and instruct in home and community integration to attain remaining goals.          Progress towards goals / Updated goals:  1. Patient will increase LEFS by 14 points in order to demonstrate improved function.   2. Pt will ambulate on even and uneven surfaces without AD safely demonstrating no signs of imbalance for improved community ambulation.   Progressing: pt. Ambulated on even and uneven surfaces without assistive device and SBA   3. Pt will increase RLE Plantar flexion strength to 4/5 for increased toe off during gait and improved ability to climb stairs.  PLAN  [x]   Upgrade activities as tolerated     [x]   Continue plan of care  []   Update interventions per flow sheet       []   Discharge due to:_  []   Other:_      Edmonia Lynch, PTA 08/30/2013  5:28 PM

## 2013-08-30 NOTE — Telephone Encounter (Signed)
[place].  This patient has only had an appointment on for June of 2013.  No current appointments were made for 2014 or 2015.Marland Kitchen

## 2013-09-02 NOTE — Progress Notes (Signed)
[facility] ??? [facility] [place] [address]  [phone]  [phone] fax    Physical Therapy Discharge Summary    Patient name: Charlesetta Milliron Start of Care: 06/13/13   Referral source: Su Hoff.,* DOB: 1956-06-29   Medical/Treatment Diagnosis: CVA (cerebral infarction) [facility]) [434.91] Onset Date: 04/23/13     Prior Hospitalization: see medical history Provider#: [id number]   Medications: Verified on Patient Summary List    Comorbidities: diabetes, arthritis, HTN  Prior Level of Function: Ind with ADLs    Visits from Start of Care:   17    Missed Visits: 4    Reporting Period : 07/25/13 to 09/02/12    Summary of Care:  Goal: Patient will increase LEFS by 14 points in order to demonstrate improved function.  Status at last note/certification: 99/37  Status at discharge: not met    Goal: Pt will ambulate on even and uneven surfaces without AD safely demonstrating no signs of imbalance for improved community ambulation.   Status at last note/certification:   Status at discharge: not met    Goal: Pt will increase RLE Plantar flexion strength to 4/5 for increased toe off during gait and improved ability to climb stairs.   Status at last note/certification:  Status at discharge: not met    Pt. Was progressing slowly towards goals. She continued to have difficulty with negotiating around obstacles and over uneven surfaces. She also continued to demonstrate decreased R PF strength. Other goals were unable to be re-assessed secondary to unplanned D/C.     ASSESSMENT/RECOMMENDATIONS:  [x] Discontinue therapy: [] Patient has reached or is progressing toward set goals      [x] Patient is non-compliant or has abdicated      [] Due to lack of appreciable progress towards set goals    Joline Salt, PT 01/09/2014 9:39 AM

## 2013-09-02 NOTE — Progress Notes (Signed)
PT DAILY TREATMENT NOTE 8-14    Patient Name: [name] AppleHelena Hagner  Date:09/02/2013  DOB: [birth year]  [x]   Patient DOB Verified  Payor: [employer] MED[place]  [place]  Product Type: Managed Care Medicaid     In time:5:42  Out time:6:18  Total Treatment Time (min): 36  Visit #: 5 of 8-12    Treatment Area: CVA (cerebral infarction) [434.91]    SUBJECTIVE  Pain Level (0-10 scale): 0/10  Any medication changes, allergies to medications, adverse drug reactions, diagnosis change, or new procedure performed?: [x]  No    []  Yes (see summary sheet for update)  Subjective functional status/changes:   []  No changes reported  No changes since last session. Pt feeling tired after a long, busy day at work. Pt wants to continue working on strengthening her right leg and stepping over obstacles so she doesn't fall anymore.     OBJECTIVE    26 min Therapeutic Exercise:  [x]  See flow sheet :   Rationale: increase ROM and increase strength to improve the patient???s ability to decrease fall risk with ambulation    10 min Therapeutic Activity:  [x]   See flow sheet : Obstacle course with low/high hurdles, foam, steps up to 4", and cone weaving   Rationale: increase ROM, increase strength, improve coordination, improve balance and increase proprioception  to improve the patient???s ability to decrease fall risk.          With   []  TE   []  TA   []  neuro   []  other: Patient Education: [x]  Review HEP    []  Progressed/Changed HEP based on:   []  positioning   []  body mechanics   []  transfers   []  heat/ice application    []  other:      Other Objective/Functional Measures: Cueing to perform obstacle course slowly with control; minor difficulty with high hurdles.     Pain Level (0-10 scale) post treatment: 0/10    ASSESSMENT/Changes in Function: Pt continues to make steady progress towards goals. Pt improving with obstacle negotiation with less fall risk but still demonstrates increased R LE fatigue. Will continue to focus on ambulating on  even/uneven surfaces as well as R LE endurance for improved household/community ambulation.     Patient will continue to benefit from skilled PT services to modify and progress therapeutic interventions, address functional mobility deficits, address ROM deficits, address strength deficits, analyze and address soft tissue restrictions, analyze and cue movement patterns, analyze and modify body mechanics/ergonomics, assess and modify postural abnormalities, address imbalance/dizziness and instruct in home and community integration to attain remaining goals.         Progress towards goals / Updated goals:  1. Patient will increase LEFS by 14 points in order to demonstrate improved function.   2. Pt will ambulate on even and uneven surfaces without AD safely demonstrating no signs of imbalance for improved community ambulation.   Progressing: pt. Ambulated on even and uneven surfaces without assistive device and SBA   3. Pt will increase RLE Plantar flexion strength to 4/5 for increased toe off during gait and improved ability to climb stairs.       PLAN  [x]   Upgrade activities as tolerated     [x]   Continue plan of care  []   Update interventions per flow sheet       []   Discharge due to:_  [x]   Other: Work on leg exercise machines to increase R LE strength.  Edmonia Lynch, PTA 09/02/2013  6:39 PM

## 2013-11-14 ENCOUNTER — Other Ambulatory Visit: Payer: Self-pay | Admitting: *Deleted

## 2013-11-14 DIAGNOSIS — E119 Type 2 diabetes mellitus without complications: Secondary | ICD-10-CM

## 2013-11-14 MED ORDER — GLIPIZIDE 10 MG PO TABS: 10.0000 mg | ORAL_TABLET | Freq: Two times a day (BID) | ORAL | Status: DC

## 2013-11-24 ENCOUNTER — Encounter

## 2014-01-03 ENCOUNTER — Encounter: Payer: Self-pay | Admitting: Internal Medicine

## 2014-01-03 ENCOUNTER — Ambulatory Visit (INDEPENDENT_AMBULATORY_CARE_PROVIDER_SITE_OTHER): Payer: Medicare Other | Admitting: Internal Medicine

## 2014-01-03 VITALS — BP 128/86 | HR 113 | Temp 98.1°F | Ht 62.5 in | Wt 262.1 lb

## 2014-01-03 DIAGNOSIS — Z1239 Encounter for other screening for malignant neoplasm of breast: Secondary | ICD-10-CM

## 2014-01-03 DIAGNOSIS — I10 Essential (primary) hypertension: Secondary | ICD-10-CM

## 2014-01-03 DIAGNOSIS — K219 Gastro-esophageal reflux disease without esophagitis: Secondary | ICD-10-CM

## 2014-01-03 DIAGNOSIS — E119 Type 2 diabetes mellitus without complications: Secondary | ICD-10-CM

## 2014-01-03 DIAGNOSIS — M1990 Unspecified osteoarthritis, unspecified site: Secondary | ICD-10-CM

## 2014-01-03 DIAGNOSIS — E785 Hyperlipidemia, unspecified: Secondary | ICD-10-CM

## 2014-01-03 DIAGNOSIS — I509 Heart failure, unspecified: Secondary | ICD-10-CM | POA: Diagnosis not present

## 2014-01-03 DIAGNOSIS — Z0000 Encounter for general adult medical examination without abnormal findings: Secondary | ICD-10-CM

## 2014-01-03 LAB — LIPID PANEL
Cholesterol: 117 mg/dL (ref 0–200)
HDL: 39 mg/dL — AB (ref >39)
LDL Cholesterol: 61 mg/dL (ref 0–99)
TRIGLYCERIDES: 85 mg/dL (ref <150)
Total CHOL/HDL Ratio: 3 Ratio
VLDL: 17 mg/dL (ref 0–40)

## 2014-01-03 LAB — BASIC METABOLIC PANEL WITH GFR
BUN: 13 mg/dL (ref 6–23)
CHLORIDE: 108 meq/L (ref 96–112)
CO2: 23 mEq/L (ref 19–32)
Calcium: 9.2 mg/dL (ref 8.4–10.5)
Creat: 0.73 mg/dL (ref 0.50–1.10)
GFR, Est African American: >89 mL/min
Glucose, Bld: 163 mg/dL — ABNORMAL HIGH (ref 70–99)
Potassium: 4.4 mEq/L (ref 3.5–5.3)
SODIUM: 140 meq/L (ref 135–145)

## 2014-01-03 LAB — GLUCOSE, CAPILLARY: Glucose-Capillary: 157 mg/dL — ABNORMAL HIGH (ref 70–99)

## 2014-01-03 LAB — POCT GLYCOSYLATED HEMOGLOBIN (HGB A1C): HEMOGLOBIN A1C: 6.2

## 2014-01-03 MED ORDER — FUROSEMIDE 80 MG PO TABS: 80.0000 mg | ORAL_TABLET | Freq: Every day | ORAL | Status: DC

## 2014-01-03 MED ORDER — INSULIN GLARGINE 100 UNIT/ML SOLOSTAR PEN: 30.0000 [IU] | PEN_INJECTOR | Freq: Two times a day (BID) | SUBCUTANEOUS | Status: DC

## 2014-01-03 MED ORDER — GLIPIZIDE 10 MG PO TABS: 10.0000 mg | ORAL_TABLET | Freq: Two times a day (BID) | ORAL | Status: DC

## 2014-01-03 MED ORDER — ASPIRIN 81 MG PO TBEC: 81.0000 mg | DELAYED_RELEASE_TABLET | Freq: Every day | ORAL | Status: DC

## 2014-01-03 MED ORDER — NAPROXEN 500 MG PO TABS: 500.0000 mg | ORAL_TABLET | Freq: Two times a day (BID) | ORAL | Status: DC

## 2014-01-03 MED ORDER — OMEPRAZOLE 20 MG PO CPDR: 20.0000 mg | DELAYED_RELEASE_CAPSULE | Freq: Two times a day (BID) | ORAL | Status: DC

## 2014-01-03 MED ORDER — POTASSIUM CHLORIDE CRYS ER 20 MEQ PO TBCR: 20.0000 meq | EXTENDED_RELEASE_TABLET | Freq: Every day | ORAL | Status: DC

## 2014-01-03 MED ORDER — SIMVASTATIN 40 MG PO TABS: 40.0000 mg | ORAL_TABLET | Freq: Every day | ORAL | Status: DC

## 2014-01-03 MED ORDER — ENALAPRIL MALEATE 20 MG PO TABS: 20.0000 mg | ORAL_TABLET | Freq: Two times a day (BID) | ORAL | Status: DC

## 2014-01-03 NOTE — Assessment & Plan Note (Signed)
BP Readings from Last 3 Encounters:  01/03/14 128/86  06/07/13 117/86  01/25/13 110/80    Lab Results  Component Value Date   NA 141 09/28/2012   K 4.0 09/28/2012   CREATININE 0.70 09/28/2012    Assessment: Blood pressure control: controlled Progress toward BP goal:  at goal Comments:   Plan: Medications:  continue current medications Educational resources provided: brochure Self management tools provided:   Other plans:  Patient's blood pressure is well controlled. Will continue current regimen.

## 2014-01-03 NOTE — Progress Notes (Signed)
Case discussed with Dr. Blaine Hamper at the time of the visit.  We reviewed the resident's history and exam and pertinent patient test results.  I agree with the assessment, diagnosis, and plan of care documented in the resident's note.

## 2014-01-03 NOTE — Assessment & Plan Note (Signed)
Lab Results  Component Value Date   HGBA1C 6.2 01/03/2014   HGBA1C 6.5 06/07/2013   HGBA1C 6.4 12/21/2012     Assessment: Diabetes control: good control (HgbA1C at goal) Progress toward A1C goal:  at goal Comments:  Plan: Medications:  continue current medications Home glucose monitoring: Frequency:   Timing:   Instruction/counseling given: reminded to get eye exam Educational resources provided: brochure Self management tools provided:   Other plans: will continue Lantus 30 units twice a day and glipizide 10 mg bid daily.

## 2014-01-03 NOTE — Assessment & Plan Note (Signed)
-  Patient refused pneumococcal vaccination, Pap smear, tetanus vaccination, will postpone -Patient will do eye examination by calling her own ophthalmologist's office -Will order mammogram

## 2014-01-03 NOTE — Assessment & Plan Note (Signed)
Although patient carries diagnoses of congestive heart failure, It is questionable whether patient still has congestive heart failure. Her previously 2-D echo 2004 showed EF 30 to 40%, but her echo on 11/08/05 showed Ef of 55 to 66%. Anyway, she feels good. Patient does not have chest pain, shortness of breath, palpitation, cough or leg edema.   -will continue current regimen, Lasix 80 mg daily, aspirin, metoprolol 50 mg daily, enalapril 20 mg daily. -will check BMP today. -will try to get Medical record about what happened in 2004 and why she developed CHF that time.

## 2014-01-03 NOTE — Progress Notes (Signed)
Patient ID: Julie Elliott, female   DOB: 07-26-1956, 58 y.o.   MRN: [id number] Subjective:   Patient ID: Julie Elliott female   DOB: [birth year] 58 y.o.   MRN: [id number]  CC:   Follow up visit.     HPI:  [name] is a 58 y.o. man lady with past medical history as outlined below, who presents for a followup visit today  1.) DM-II: She is currently taking Lantus 30 units twice a day. Today her A1c is 6.2. she is also taking glipizide 10 mg bid daily. She did not have symptoms for hypoglycemia.   2.) Hypertension: Patient's blood pressure is well controlled. Her blood pressure is 128/86  today.  3.) Hyperlipidemia: LDL was 77 on 09/28/12. Patient is currently taking Zocor 40 mg daily. She did not noticed any side effects, such as muscle pain. Her AST and ALT was normal at 03/12/11.   4. CHF: 2 d echo on 11/08/05 showed Ef of 55 to 66%. Patient feels normal. Patient does not have chest pain, shortness of breath, palpitation, cough or leg edema.   ROS:  Denies fever, chills, fatigue, headaches,  cough, chest pain, SOB,  abdominal pain, diarrhea, constipation, dysuria, urgency, frequency, hematuria.   Past Medical History  Diagnosis Date  . Hypertension     well controlled  . DJD (degenerative joint disease)   . HLD (hyperlipidemia) 2008    stable, well controlled  . DM (diabetes mellitus) 2008    stable HgBA1C at 6.5  . GERD (gastroesophageal reflux disease)     well controlled on Omeprazole  . Chronic headaches    Current Outpatient Prescriptions  Medication Sig Dispense Refill  . aspirin 81 MG EC tablet Take 1 tablet (81 mg total) by mouth daily.  90 tablet  1  . B-D ULTRA-FINE 33 LANCETS MISC Use for checking blood sugar, 3 times per day. Diagnosis Code is 250.00  100 each  11  . Calcium-Vitamin D 600-125 MG-UNIT TABS Take 1 tablet by mouth daily.  90 each  1  . enalapril (VASOTEC) 20 MG tablet Take 1 tablet (20 mg total) by mouth 2 (two) times daily.  180 tablet  3  . furosemide (LASIX)  80 MG tablet Take 1 tablet (80 mg total) by mouth daily.  90 tablet  3  . glipiZIDE (GLUCOTROL) 10 MG tablet Take 1 tablet (10 mg total) by mouth 2 (two) times daily before a meal.  240 tablet  5  . glucose blood (BAYER CONTOUR TEST) test strip [employer]. Use to test blood sugar 3 times a day.  100 each  11  . Insulin Glargine (LANTUS SOLOSTAR) 100 UNIT/ML Solostar Pen Inject 30 Units into the skin 2 (two) times daily.  30 mL  5  . Insulin Pen Needle 32G X 4 MM MISC Use for insulin injection, bid. Diagnosis code is 250.00 for DM-II.  100 each  11  . metoprolol succinate (TOPROL-XL) 50 MG 24 hr tablet Take 1 tablet (50 mg total) by mouth daily.  90 tablet  3  . naproxen (NAPROSYN) 500 MG tablet Take 1 tablet (500 mg total) by mouth 2 (two) times daily.  180 tablet  3  . omeprazole (PRILOSEC) 20 MG capsule Take 1 capsule (20 mg total) by mouth 2 (two) times daily.  180 capsule  2  . potassium chloride SA (K-DUR,KLOR-CON) 20 MEQ tablet Take 1 tablet (20 mEq total) by mouth daily.  90 tablet  3  . simvastatin (ZOCOR) 40 MG tablet  Take 1 tablet (40 mg total) by mouth daily.  90 tablet  3   No current facility-administered medications for this visit.   Family History  Problem Relation Age of Onset  . Diabetes Mother   . Heart disease Mother   . Prostate cancer Father    History   Social History  . Marital Status: Single    Spouse Name: N/A    Number of Children: N/A  . Years of Education: N/A   Social History Main Topics  . Smoking status: Former Smoker -- 0.30 packs/day    Types: Cigarettes    Quit date: 04/18/2013  . Smokeless tobacco: None     Comment: Smokes 5-6 cigerettes per day  . Alcohol Use: None  . Drug Use: None  . Sexual Activity: None   Other Topics Concern  . None   Social History Narrative   to get supplies nfrom [facility] per pt request.[phone]    Review of Systems: Full 14-point review of systems otherwise negative. See HPI.   Objective:  Physical  Exam: Filed Vitals:   01/03/14 1356  BP: 128/86  Pulse: 113  Temp: 98.1 F (36.7 C)  TempSrc: Oral  Height: 5' 2.5" (1.588 m)  Weight: 262 lb 1.6 oz (118.888 kg)  SpO2: 98%   Physical Exam: . Constitutional: Vital signs reviewed.  Patient is a well-developed and well-nourished, in no acute distress and cooperative with exam.    HEENT:   Head: Normocephalic and atraumatic Mouth: no erythema or exudates, MMM Eyes: PERRL, EOMI, conjunctivae normal, No scleral icterus.   Neck: Supple, Trachea midline normal ROM, No JVD, mass, thyromegaly, or carotid bruit present. No lymph node enlargement. Cardiovascular: RRR, S1 normal, S2 normal, no MRG, pulses symmetric and intact bilaterally Pulmonary/Chest: CTAB, no wheezes, rales, or rhonchi Abdominal: Soft. Non-tender, non-distended, bowel sounds are normal, no masses, organomegaly, or guarding present.   GU: no CVA tenderness Musculoskeletal: No joint deformities, erythema, or stiffness, ROM full and non-tender Hematology: no cervical, inginal, or axillary adenopathy.   Neurological: A&O x3, Strength is normal and symmetric bilaterally, cranial nerve II-XII are grossly intact, no focal motor deficit, sensory intact to light touch bilaterally.   Psychiatric: Normal mood and affect. speech and behavior is normal. Judgment and thought content normal. Cognition and memory are normal.    Assessment & Plan:

## 2014-01-03 NOTE — Patient Instructions (Signed)
1. You have done great job in taking all your medications. I appreciate it very much. Please continue doing that. 2. Please take all medications as prescribed.  3. If you have worsening of your symptoms or new symptoms arise, please call the clinic ([phone]), or go to the ER immediately if symptoms are severe.  Please bring in all your medication bottles with you in next visit.   Please measure your blood sugar level ideally 3 times a day. If it is too difficult for you, please measure your blood sugar level once a day, and 3 times a day for at least 3 days before coming back for next visit.

## 2014-01-17 ENCOUNTER — Encounter: Payer: Medicare Other | Admitting: Internal Medicine

## 2014-02-27 ENCOUNTER — Other Ambulatory Visit: Payer: Self-pay | Admitting: *Deleted

## 2014-02-27 MED ORDER — INSULIN PEN NEEDLE 32G X 4 MM MISC: Status: DC

## 2014-02-27 NOTE — Telephone Encounter (Signed)
Message sent to front desk pool regarding appt per Dr Butcher. 

## 2014-02-27 NOTE — Telephone Encounter (Signed)
Pt needs Dec appt with PCP routine Appt

## 2014-03-21 ENCOUNTER — Other Ambulatory Visit: Payer: Self-pay | Admitting: *Deleted

## 2014-03-21 MED ORDER — METOPROLOL SUCCINATE ER 50 MG PO TB24: 50.0000 mg | ORAL_TABLET | Freq: Every day | ORAL | Status: DC

## 2014-03-24 ENCOUNTER — Telehealth: Payer: Self-pay | Admitting: Dietician

## 2014-03-24 NOTE — Telephone Encounter (Signed)
Patient wants to have her yearly eye exam done here in November at 3:30 PM on the same day she sees her doctor. Will ask front office to schedule

## 2014-06-20 ENCOUNTER — Encounter: Payer: Medicare Other | Admitting: Dietician

## 2014-06-20 ENCOUNTER — Ambulatory Visit (INDEPENDENT_AMBULATORY_CARE_PROVIDER_SITE_OTHER): Payer: Medicare Other | Admitting: Internal Medicine

## 2014-06-20 ENCOUNTER — Encounter: Payer: Self-pay | Admitting: Internal Medicine

## 2014-06-20 VITALS — BP 119/71 | HR 86 | Temp 97.7°F | Ht 62.5 in | Wt 235.2 lb

## 2014-06-20 DIAGNOSIS — R200 Anesthesia of skin: Secondary | ICD-10-CM

## 2014-06-20 DIAGNOSIS — R209 Unspecified disturbances of skin sensation: Secondary | ICD-10-CM | POA: Diagnosis not present

## 2014-06-20 DIAGNOSIS — R202 Paresthesia of skin: Secondary | ICD-10-CM | POA: Diagnosis not present

## 2014-06-20 DIAGNOSIS — I10 Essential (primary) hypertension: Secondary | ICD-10-CM

## 2014-06-20 DIAGNOSIS — R634 Abnormal weight loss: Secondary | ICD-10-CM | POA: Diagnosis not present

## 2014-06-20 DIAGNOSIS — R2681 Unsteadiness on feet: Secondary | ICD-10-CM | POA: Insufficient documentation

## 2014-06-20 DIAGNOSIS — E119 Type 2 diabetes mellitus without complications: Secondary | ICD-10-CM | POA: Diagnosis not present

## 2014-06-20 LAB — BASIC METABOLIC PANEL WITH GFR
BUN: 12 mg/dL (ref 6–23)
CALCIUM: 9.1 mg/dL (ref 8.4–10.5)
CO2: 25 mEq/L (ref 19–32)
CREATININE: 0.7 mg/dL (ref 0.50–1.10)
Chloride: 104 mEq/L (ref 96–112)
GFR, Est Non African American: >89 mL/min
GLUCOSE: 156 mg/dL — AB (ref 70–99)
Potassium: 3.9 mEq/L (ref 3.5–5.3)
Sodium: 138 mEq/L (ref 135–145)

## 2014-06-20 LAB — GLUCOSE, CAPILLARY: Glucose-Capillary: 148 mg/dL — ABNORMAL HIGH (ref 70–99)

## 2014-06-20 LAB — CBC
HCT: 40.6 % (ref 36.0–46.0)
Hemoglobin: 13.9 g/dL (ref 12.0–15.0)
MCH: 29 pg (ref 26.0–34.0)
MCHC: 34.2 g/dL (ref 30.0–36.0)
MCV: 84.8 fL (ref 78.0–100.0)
MPV: 11.3 fL (ref 9.4–12.4)
Platelets: 246 10*3/uL (ref 150–400)
RBC: 4.79 MIL/uL (ref 3.87–5.11)
RDW: 14 % (ref 11.5–15.5)
WBC: 5.6 10*3/uL (ref 4.0–10.5)

## 2014-06-20 LAB — TSH: TSH: 0.703 u[IU]/mL (ref 0.350–4.500)

## 2014-06-20 LAB — POCT GLYCOSYLATED HEMOGLOBIN (HGB A1C): Hemoglobin A1C: 6.8

## 2014-06-20 LAB — HM DIABETES EYE EXAM

## 2014-06-20 LAB — VITAMIN B12: Vitamin B-12: 781 pg/mL (ref 211–911)

## 2014-06-20 MED ORDER — METFORMIN HCL 500 MG PO TABS: 500.0000 mg | ORAL_TABLET | Freq: Every day | ORAL | Status: DC

## 2014-06-20 NOTE — Assessment & Plan Note (Signed)
BP Readings from Last 3 Encounters:  06/20/14 119/71  01/03/14 128/86  06/07/13 117/86    Lab Results  Component Value Date   NA 140 01/03/2014   K 4.4 01/03/2014   CREATININE 0.73 01/03/2014    Assessment: Blood pressure control: controlled Progress toward BP goal:  at goal Comments:   Plan: Medications:  continue current medications Educational resources provided:   Self management tools provided:   Other plans:  BP at goal. Will continue Elanapril 20 mg BID, Lasix 80 mg daily, and Metoprolol 50 mg daily.

## 2014-06-20 NOTE — Patient Instructions (Addendum)
It was a pleasure meeting you today, [name].   1. Diabetes - Continue taking Glipizide 10 mg daily - Start Metformin 500 mg daily - Stop taking Lantus   2. Unsteady feeling - We are testing your Vitamin B12 levels. I will call you with your results and let you know if you need a shot.  General Instructions:   Thank you for bringing your medicines today. This helps [place] keep you safe from mistakes.   Progress Toward Treatment Goals:  Treatment Goal 01/03/2014  Hemoglobin A1C at goal  Blood pressure at goal  Stop smoking -    Self Care Goals & Plans:  Self Care Goal 06/07/2013  Manage my medications take my medicines as prescribed; bring my medications to every visit; refill my medications on time; follow the sick day instructions if I am sick  Monitor my health keep track of my blood glucose; bring my glucose meter and log to each visit; keep track of my blood pressure; keep track of my weight; check my feet daily  Eat healthy foods eat more vegetables; eat fruit for snacks and desserts; eat foods that are low in salt; eat baked foods instead of fried foods; eat smaller portions; drink diet soda or water instead of juice or soda  Be physically active take a walk every day; find an activity I enjoy    Home Blood Glucose Monitoring 12/21/2012  Check my blood sugar once a day  When to check my blood sugar before breakfast     Care Management & Community Referrals:  No flowsheet data found.

## 2014-06-20 NOTE — Assessment & Plan Note (Signed)
Pt has lost 27 lbs since June this year. Pt reports weight loss unintentional, but she has changed her diet by decreasing her volume of food intake due to decreased appetite. She denies any warning symptoms.  Weight loss most likely due to decreased food intake. Will continue to monitor. - CBC today to assess for anemia - Pt had colonoscopy in 2009 which was normal, next due in 2019

## 2014-06-20 NOTE — Assessment & Plan Note (Signed)
Lab Results  Component Value Date   HGBA1C 6.8 06/20/2014   HGBA1C 6.2 01/03/2014   HGBA1C 6.5 06/07/2013     Assessment: Diabetes control: fair control Progress toward A1C goal:  deteriorated Comments:   Plan: Medications:  See below Home glucose monitoring: Frequency: once a day Timing:   Instruction/counseling given: reminded to bring blood glucose meter & log to each visit, reminded to bring medications to each visit and discussed diet Educational resources provided:   Self management tools provided:   Other plans:  Patient reported she stopped taking Lantus 30 units BID about 1 month ago due to hypoglycemia. HbA1c 6.8 today. Concern that blood sugars may continue to be elevated since not taking Lantus anymore. Patient reports she does not like giving herself shots as well. Will start Metformin 500 mg daily.  - Continue Glipizide 10 mg daily - Start Metformin 500 mg daily - Reassess blood sugars/HbA1c in 3 months

## 2014-06-20 NOTE — Assessment & Plan Note (Signed)
Pt with 2-3 month hx of unsteadiness while ambulating. She reports a tingling sensation in her legs bilaterally as well as her hands. She has a wide based gait and seems unsteady while ambulating. No history of alcohol abuse. Differential does include posterior circulation stroke/cerebellar pathology, but less likely since patient denies dizziness and does not have swaying or leaning to one side. Will check for Vitamin B12 deficiency first and continue to monitor.  - Vitamin B12 level - TSH

## 2014-06-20 NOTE — Progress Notes (Signed)
   Subjective:    Patient ID: Julie Elliott, female    DOB: [birth year], 58 y.o.   MRN: [id number]  HPI Julie Elliott is a 58yo woman w/ PMHx of HTN, CHF, Type 2 DM, and HLD who presents today for the following:  1. Feeling "Unbalanced": Patient reports for the last 2-3 months she has felt "unbalanced" and unsteady while ambulating. She reports she has an achy, tingling, "icy-hot" sensation in her legs. This sensation goes throughout her entire legs bilaterally. She states she has good sensation in her feet. She reports taking Naproxen with little relief. She also reports the tingling sensation in her hands as well, left worse than right. She also notes feeling "disoriented" but not dizzy.   2. Type 2 DM: Last HbA1c 6.2 on 01/03/14. Her HbA1c today is 6.8. She states she was getting hypoglycemia in the 50s or lower and stopped taking her Lantus 30 units BID about 1 month ago. She denies further episodes of hypoglycemia since stopping her Lantus. She states she is still taking her Glipizide 10 mg daily.   3. Weight Loss: Patient reports she has lost ~30 lbs in the last few months. Per records, she has lost 27 lbs since June this year. She states she has not intentionally meant to lose weight, but has adjusted her diet. She states she has had a decreased appetite and therefore has been decreasing her volume of food. She reports she is still eating starches, but just eats less of them. She denies fever, chills, night sweats, melena, hematochezia.   4. HTN: BP today 119/71. She takes Elanapril 20 mg BID, Lasix 80 mg daily, and Metoprolol 50 mg daily at home.      Review of Systems General: See HPI HEENT: Denies headaches, ear pain, changes in vision, rhinorrhea, sore throat CV: Denies CP, palpitations, SOB, orthopnea Pulm: Denies SOB, cough, wheezing GI: Denies abdominal pain, nausea, vomiting, diarrhea, constipation GU: Denies dysuria, hematuria, frequency Msk: See HPI Neuro: Denies weakness Skin: Denies  rashes, bruising    Objective:   Physical Exam General: alert, obese, sitting up in chair, NAD HEENT: [place]/AT, EOMI, PERRL, sclera anicteric, pharynx non-erythematous, mucus membranes moist Neck: supple, no JVD, no lymphadenopathy CV: RRR, normal S1/S2, no m/g/r Pulm: CTA bilaterally, breaths non-labored, no wheezing Abd: BS+, soft, non-distended, non-tender Ext: warm, no edema, moves all Neuro: alert and oriented x 3, CNs II-XII intact, finger to nose intact, strength 5/5 in upper and lower extremities bilaterally. Gait appears wide and unsteady. Pt does not lean to one side.        Assessment & Plan:

## 2014-06-21 NOTE — Progress Notes (Signed)
I saw and evaluated the patient.  I personally confirmed the key portions of Dr. Shela Commons history and exam and reviewed pertinent patient test results.  The assessment, diagnosis, and plan were formulated together and I agree with the documentation in the resident's note.

## 2014-06-23 ENCOUNTER — Encounter: Payer: Self-pay | Admitting: *Deleted

## 2014-08-04 ENCOUNTER — Emergency Department (HOSPITAL_COMMUNITY): Payer: Medicare Other

## 2014-08-04 ENCOUNTER — Emergency Department (HOSPITAL_COMMUNITY)
Admission: EM | Admit: 2014-08-04 | Discharge: 2014-08-04 | Disposition: A | Discharge status: Home / Self Care | Payer: Medicare Other | Attending: Emergency Medicine | Admitting: Emergency Medicine

## 2014-08-04 ENCOUNTER — Encounter (HOSPITAL_COMMUNITY): Payer: Self-pay | Admitting: Physical Medicine and Rehabilitation

## 2014-08-04 DIAGNOSIS — M545 Low back pain: Secondary | ICD-10-CM | POA: Diagnosis not present

## 2014-08-04 DIAGNOSIS — Z794 Long term (current) use of insulin: Secondary | ICD-10-CM | POA: Diagnosis not present

## 2014-08-04 DIAGNOSIS — M25512 Pain in left shoulder: Secondary | ICD-10-CM | POA: Insufficient documentation

## 2014-08-04 DIAGNOSIS — Z7982 Long term (current) use of aspirin: Secondary | ICD-10-CM | POA: Diagnosis not present

## 2014-08-04 DIAGNOSIS — G8929 Other chronic pain: Secondary | ICD-10-CM | POA: Diagnosis not present

## 2014-08-04 DIAGNOSIS — I10 Essential (primary) hypertension: Secondary | ICD-10-CM | POA: Diagnosis not present

## 2014-08-04 DIAGNOSIS — M5136 Other intervertebral disc degeneration, lumbar region: Secondary | ICD-10-CM | POA: Diagnosis not present

## 2014-08-04 DIAGNOSIS — E875 Hyperkalemia: Secondary | ICD-10-CM | POA: Diagnosis not present

## 2014-08-04 DIAGNOSIS — E119 Type 2 diabetes mellitus without complications: Secondary | ICD-10-CM | POA: Diagnosis not present

## 2014-08-04 DIAGNOSIS — Z79899 Other long term (current) drug therapy: Secondary | ICD-10-CM | POA: Diagnosis not present

## 2014-08-04 DIAGNOSIS — M160 Bilateral primary osteoarthritis of hip: Secondary | ICD-10-CM | POA: Diagnosis not present

## 2014-08-04 DIAGNOSIS — R202 Paresthesia of skin: Secondary | ICD-10-CM | POA: Insufficient documentation

## 2014-08-04 DIAGNOSIS — G629 Polyneuropathy, unspecified: Secondary | ICD-10-CM

## 2014-08-04 DIAGNOSIS — M1990 Unspecified osteoarthritis, unspecified site: Secondary | ICD-10-CM | POA: Diagnosis not present

## 2014-08-04 DIAGNOSIS — M549 Dorsalgia, unspecified: Secondary | ICD-10-CM

## 2014-08-04 DIAGNOSIS — M25559 Pain in unspecified hip: Secondary | ICD-10-CM | POA: Insufficient documentation

## 2014-08-04 DIAGNOSIS — Z791 Long term (current) use of non-steroidal anti-inflammatories (NSAID): Secondary | ICD-10-CM | POA: Insufficient documentation

## 2014-08-04 DIAGNOSIS — R200 Anesthesia of skin: Secondary | ICD-10-CM | POA: Diagnosis not present

## 2014-08-04 DIAGNOSIS — Z87891 Personal history of nicotine dependence: Secondary | ICD-10-CM | POA: Diagnosis not present

## 2014-08-04 DIAGNOSIS — K219 Gastro-esophageal reflux disease without esophagitis: Secondary | ICD-10-CM | POA: Insufficient documentation

## 2014-08-04 DIAGNOSIS — M159 Polyosteoarthritis, unspecified: Secondary | ICD-10-CM | POA: Diagnosis not present

## 2014-08-04 DIAGNOSIS — M1612 Unilateral primary osteoarthritis, left hip: Secondary | ICD-10-CM | POA: Diagnosis not present

## 2014-08-04 DIAGNOSIS — E785 Hyperlipidemia, unspecified: Secondary | ICD-10-CM | POA: Diagnosis not present

## 2014-08-04 HISTORY — PX: CERVICAL SPINE SURGERY: SHX589

## 2014-08-04 LAB — BASIC METABOLIC PANEL
Anion gap: 11 (ref 5–15)
BUN: 16 mg/dL (ref 6–23)
CALCIUM: 9 mg/dL (ref 8.4–10.5)
CO2: 21 mmol/L (ref 19–32)
Chloride: 109 mEq/L (ref 96–112)
Creatinine, Ser: 0.68 mg/dL (ref 0.50–1.10)
GFR calc Af Amer: >90 mL/min (ref >90)
GFR calc non Af Amer: >90 mL/min (ref >90)
GLUCOSE: 193 mg/dL — AB (ref 70–99)
Potassium: 3.4 mmol/L — ABNORMAL LOW (ref 3.5–5.1)
SODIUM: 141 mmol/L (ref 135–145)

## 2014-08-04 LAB — URINALYSIS, ROUTINE W REFLEX MICROSCOPIC
Glucose, UA: 500 mg/dL — AB
Hgb urine dipstick: NEGATIVE
KETONES UR: 15 mg/dL — AB
Leukocytes, UA: NEGATIVE
Nitrite: NEGATIVE
Protein, ur: NEGATIVE mg/dL
Specific Gravity, Urine: 1.039 — ABNORMAL HIGH (ref 1.005–1.030)
Urobilinogen, UA: 1 mg/dL (ref 0.0–1.0)
pH: 5.5 (ref 5.0–8.0)

## 2014-08-04 LAB — CBC
HCT: 39.5 % (ref 36.0–46.0)
Hemoglobin: 13.2 g/dL (ref 12.0–15.0)
MCH: 29.4 pg (ref 26.0–34.0)
MCHC: 33.4 g/dL (ref 30.0–36.0)
MCV: 88 fL (ref 78.0–100.0)
Platelets: 229 10*3/uL (ref 150–400)
RBC: 4.49 MIL/uL (ref 3.87–5.11)
RDW: 13.5 % (ref 11.5–15.5)
WBC: 7 10*3/uL (ref 4.0–10.5)

## 2014-08-04 LAB — CBG MONITORING, ED: GLUCOSE-CAPILLARY: 119 mg/dL — AB (ref 70–99)

## 2014-08-04 LAB — I-STAT CG4 LACTIC ACID, ED: LACTIC ACID, VENOUS: 2.2 mmol/L (ref 0.5–2.2)

## 2014-08-04 MED ORDER — TRAMADOL HCL 50 MG PO TABS: 50.0000 mg | ORAL_TABLET | Freq: Four times a day (QID) | ORAL | Status: DC | PRN

## 2014-08-04 MED ORDER — GABAPENTIN 300 MG PO CAPS: ORAL_CAPSULE | ORAL | Status: DC

## 2014-08-04 MED ORDER — SODIUM CHLORIDE 0.45 % IV BOLUS
1000.0000 mL | Freq: Once | INTRAVENOUS | Status: AC
  Administered 2014-08-04: 1000 mL via INTRAVENOUS

## 2014-08-04 NOTE — ED Provider Notes (Signed)
CSN: [id number]     Arrival date & time 08/04/14  1221 History   First MD Initiated Contact with Patient 08/04/14 1229     Chief Complaint  Patient presents with  . Leg Pain  . Hip Pain     (Consider location/radiation/quality/duration/timing/severity/associated sxs/prior Treatment) HPI  Julie Elliott Is a 59 year old female with a past medical history of insulin-dependent diabetes mellitus, osteoarthritis, and chronic dysphasia who presents emergency Department with chief complaint of bilateral cold lower extremities and worsening of her paresthesia. She is pleasant but gives a very difficult to follow history. Essentially, the patient has had ongoing lower back and hip pain since a fall that occurred in 2007. She has also had bilateral chronic paresthesia which is worse on the left side and radiates up into her hip. She takes no medication for this. She states that she has seen her primary care doctor, neurology and rheumatology" no one knows what is going on with me." Previously that she has peripheral neuropathy but states that " that's misdiagnosed."  She states that this morning she awoke with extremely cold feet and burning sensation. She states that her burning sensations get worse with sitting for long periods of time. She also has deep aching in the legs that is relieved with rest. She is also complaining of chronic left shoulder pain. She states she's had an MRI." Nobody can tell me was going on with me." She has no known arterial disease. She denies history of smoking. She is a history of hypertension and diabetes. She denies fevers, chills, nausea, vomiting or abdominal pain. She denies chest pain or shortness of breath.  Past Medical History  Diagnosis Date  . Hypertension     well controlled  . DJD (degenerative joint disease)   . HLD (hyperlipidemia) 2008    stable, well controlled  . DM (diabetes mellitus) 2008    stable HgBA1C at 6.5  . GERD (gastroesophageal reflux disease)     well controlled on Omeprazole  . Chronic headaches    Past Surgical History  Procedure Laterality Date  . Tubal ligation     Family History  Problem Relation Age of Onset  . Diabetes Mother   . Heart disease Mother   . Prostate cancer Father    History  Substance Use Topics  . Smoking status: Former Smoker -- 0.30 packs/day    Types: Cigarettes    Quit date: 04/18/2013  . Smokeless tobacco: Not on file     Comment: Smokes 5-6 cigerettes per day  . Alcohol Use: No   OB History    No data available     Review of Systems  Ten systems reviewed and are negative for acute change, except as noted in the HPI.    Allergies  Loratadine-pseudoephedrine er  Home Medications   Prior to Admission medications   Medication Sig Start Date End Date Taking? Authorizing Provider  aspirin 81 MG EC tablet Take 1 tablet (81 mg total) by mouth daily. 01/03/14   Ivor Costa, MD  B-D ULTRA-FINE 33 LANCETS MISC Use for checking blood sugar, 3 times per day. Diagnosis Code is 250.00 01/25/13   Ivor Costa, MD  Calcium-Vitamin D 8177552868 MG-UNIT TABS Take 1 tablet by mouth daily. 12/16/11   Ivor Costa, MD  enalapril (VASOTEC) 20 MG tablet Take 1 tablet (20 mg total) by mouth 2 (two) times daily. 01/03/14   Ivor Costa, MD  furosemide (LASIX) 80 MG tablet Take 1 tablet (80 mg total) by mouth daily.  01/03/14   Ivor Costa, MD  glipiZIDE (GLUCOTROL) 10 MG tablet Take 1 tablet (10 mg total) by mouth 2 (two) times daily before a meal. 01/03/14   Ivor Costa, MD  glucose blood (BAYER CONTOUR TEST) test strip [employer]. Use to test blood sugar 3 times a day. 01/25/13   Ivor Costa, MD  Insulin Glargine (LANTUS SOLOSTAR) 100 UNIT/ML Solostar Pen Inject 30 Units into the skin 2 (two) times daily. 01/03/14   Ivor Costa, MD  Insulin Pen Needle 32G X 4 MM MISC Use for insulin injection, bid. Diagnosis code is 250.00 for DM-II. 02/27/14   Bartholomew Crews, MD  metFORMIN (GLUCOPHAGE) 500 MG tablet Take 1 tablet (500 mg total) by mouth  daily with breakfast. 06/20/14   [name], MD  metoprolol succinate (TOPROL-XL) 50 MG 24 hr tablet Take 1 tablet (50 mg total) by mouth daily. 03/21/14   [name], MD  naproxen (NAPROSYN) 500 MG tablet Take 1 tablet (500 mg total) by mouth 2 (two) times daily. 01/03/14   Ivor Costa, MD  omeprazole (PRILOSEC) 20 MG capsule Take 1 capsule (20 mg total) by mouth 2 (two) times daily. 01/03/14   Ivor Costa, MD  potassium chloride SA (K-DUR,KLOR-CON) 20 MEQ tablet Take 1 tablet (20 mEq total) by mouth daily. 01/03/14   Ivor Costa, MD  simvastatin (ZOCOR) 40 MG tablet Take 1 tablet (40 mg total) by mouth daily. 01/03/14   Ivor Costa, MD   BP 101/77 mmHg  Pulse 96  Temp(Src) 98.2 F (36.8 C) (Oral)  Resp 18  SpO2 100% Physical Exam  Constitutional: She is oriented to person, place, and time. She appears well-developed and well-nourished. No distress.  HENT:  Head: Normocephalic and atraumatic.  Eyes: Conjunctivae are normal. No scleral icterus.  Neck: Normal range of motion.  Cardiovascular: Normal rate, regular rhythm and normal heart sounds.  Exam reveals no gallop and no friction rub.   No murmur heard. Pulmonary/Chest: Effort normal and breath sounds normal. No respiratory distress.  Abdominal: Soft. Bowel sounds are normal. She exhibits no distension and no mass. There is no tenderness. There is no guarding.  Musculoskeletal:  Patient's legs are cool to the touch. Thready pulses are palpable. On Doppler examination. She has a monophasic left. DP and PT pulse. She has a biphasic right DP pulse and a monophasic right PT pulse.   Neurological: She is alert and oriented to person, place, and time.  Skin: Skin is warm and dry. She is not diaphoretic.  Nursing note and vitals reviewed.   ED Course  Procedures (including critical care time) Labs Review Labs Reviewed  BASIC METABOLIC PANEL - Abnormal; Notable for the following:    Potassium 3.4 (*)    Glucose, Bld 193 (*)    All other components  within normal limits  CBC  URINALYSIS, ROUTINE W REFLEX MICROSCOPIC  I-STAT CG4 LACTIC ACID, ED  CBG MONITORING, ED    Imaging Review No results found.   EKG Interpretation None      MDM   Final diagnoses:  Back pain    Patient here with complaint of the legs. Think this is most likely peripheral neuropathy from her diabetes. The patient seems to have a component of possible decreased arterial blood flow to the legs. Ordered an ankle brachial index to measure this. She denies any pain at this time. However, just uncomfortable from paresthesia. Labs show elevated glucose and potassium at 3.4. Awaiting imaging currently.   Negative ABI >1 Believe this  is neuropathy. Patient will begin on neurontin. F/u with pcp  Margarita Mail, PA-C 08/09/14 2052  Tanna Furry, MD 08/27/14 [phone]

## 2014-08-04 NOTE — ED Notes (Signed)
To x-ray

## 2014-08-04 NOTE — Progress Notes (Signed)
VASCULAR LAB PRELIMINARY  ARTERIAL  ABI completed:    RIGHT    LEFT    PRESSURE WAVEFORM  PRESSURE WAVEFORM  BRACHIAL 105 triphasic BRACHIAL 104 triphasic  DP   DP    AT 111 triphasic AT 108 triphasic  PT 106 triphasic PT 107 triphasic  PER   PER    GREAT TOE  adequate GREAT TOE  adequate    RIGHT LEFT  ABI >1.0 >1.0     Erinne Gillentine, RVT 08/04/2014, 4:27 PM

## 2014-08-04 NOTE — ED Notes (Addendum)
Pt states numbness/tingling to bilateral legs this morning, also reports L hip pain. Pt states chronic issues with both since fall 2007. No obvious deformities noted. Pt is alert and oriented x4. NAD.

## 2014-08-04 NOTE — Discharge Instructions (Signed)
Peripheral Neuropathy Peripheral neuropathy is a type of nerve damage. It affects nerves that carry signals between the spinal cord and other parts of the body. These are called peripheral nerves. With peripheral neuropathy, one nerve or a group of nerves may be damaged.  CAUSES  Many things can damage peripheral nerves. For some people with peripheral neuropathy, the cause is unknown. Some causes include:  Diabetes. This is the most common cause of peripheral neuropathy.  Injury to a nerve.  Pressure or stress on a nerve that lasts a long time.  Too little vitamin B. Alcoholism can lead to this.  Infections.  Autoimmune diseases, such as multiple sclerosis and systemic lupus erythematosus.  Inherited nerve diseases.  Some medicines, such as cancer drugs.  Toxic substances, such as lead and mercury.  Too little blood flowing to the legs.  Kidney disease.  Thyroid disease. SIGNS AND SYMPTOMS  Different people have different symptoms. The symptoms you have will depend on which of your nerves is damaged. Common symptoms include:  Loss of feeling (numbness) in the feet and hands.  Tingling in the feet and hands.  Pain that burns.  Very sensitive skin.  Weakness.  Not being able to move a part of the body (paralysis).  Muscle twitching.  Clumsiness or poor coordination.  Loss of balance.  Not being able to control your bladder.  Feeling dizzy.  Sexual problems. DIAGNOSIS  Peripheral neuropathy is a symptom, not a disease. Finding the cause of peripheral neuropathy can be hard. To figure that out, your health care provider will take a medical history and do a physical exam. A neurological exam will also be done. This involves checking things affected by your brain, spinal cord, and nerves (nervous system). For example, your health care provider will check your reflexes, how you move, and what you can feel.  Other types of tests may also be ordered, such as:  Blood  tests.  A test of the fluid in your spinal cord.  Imaging tests, such as CT scans or an MRI.  Electromyography (EMG). This test checks the nerves that control muscles.  Nerve conduction velocity tests. These tests check how fast messages pass through your nerves.  Nerve biopsy. A small piece of nerve is removed. It is then checked under a microscope. TREATMENT   Medicine is often used to treat peripheral neuropathy. Medicines may include:  Pain-relieving medicines. Prescription or over-the-counter medicine may be suggested.  Antiseizure medicine. This may be used for pain.  Antidepressants. These also may help ease pain from neuropathy.  Lidocaine. This is a numbing medicine. You might wear a patch or be given a shot.  Mexiletine. This medicine is typically used to help control irregular heart rhythms.  Surgery. Surgery may be needed to relieve pressure on a nerve or to destroy a nerve that is causing pain.  Physical therapy to help movement.  Assistive devices to help movement. HOME CARE INSTRUCTIONS   Only take over-the-counter or prescription medicines as directed by your health care provider. Follow the instructions carefully for any given medicines. Do not take any other medicines without first getting approval from your health care provider.  If you have diabetes, work closely with your health care provider to keep your blood sugar under control.  If you have numbness in your feet:  Check every day for signs of injury or infection. Watch for redness, warmth, and swelling.  Wear padded socks and comfortable shoes. These help protect your feet.  Do not do  things that put pressure on your damaged nerve.  Do not smoke. Smoking keeps blood from getting to damaged nerves.  Avoid or limit alcohol. Too much alcohol can cause a lack of B vitamins. These vitamins are needed for healthy nerves.  Develop a good support system. Coping with peripheral neuropathy can be  stressful. Talk to a mental health specialist or join a support group if you are struggling.  Follow up with your health care provider as directed. SEEK MEDICAL CARE IF:   You have new signs or symptoms of peripheral neuropathy.  You are struggling emotionally from dealing with peripheral neuropathy.  You have a fever. SEEK IMMEDIATE MEDICAL CARE IF:   You have an injury or infection that is not healing.  You feel very dizzy or begin vomiting.  You have chest pain.  You have trouble breathing. Document Released: 07/11/2002 Document Revised: 04/02/2011 Document Reviewed: 03/28/2013 [facility] Patient Information 2015 [place], [place]. This information is not intended to replace advice given to you by your health care provider. Make sure you discuss any questions you have with your health care provider. Arthritis, Nonspecific Arthritis is inflammation of a joint. This usually means pain, redness, warmth or swelling are present. One or more joints may be involved. There are a number of types of arthritis. Your caregiver may not be able to tell what type of arthritis you have right away. CAUSES  The most common cause of arthritis is the wear and tear on the joint (osteoarthritis). This causes damage to the cartilage, which can break down over time. The knees, hips, back and neck are most often affected by this type of arthritis. Other types of arthritis and common causes of joint pain include:  Sprains and other injuries near the joint. Sometimes minor sprains and injuries cause pain and swelling that develop hours later.  Rheumatoid arthritis. This affects hands, feet and knees. It usually affects both sides of your body at the same time. It is often associated with chronic ailments, fever, weight loss and general weakness.  Crystal arthritis. Gout and pseudo gout can cause occasional acute severe pain, redness and swelling in the foot, ankle, or knee.  Infectious arthritis. Bacteria can  get into a joint through a break in overlying skin. This can cause infection of the joint. Bacteria and viruses can also spread through the blood and affect your joints.  Drug, infectious and allergy reactions. Sometimes joints can become mildly painful and slightly swollen with these types of illnesses. SYMPTOMS   Pain is the main symptom.  Your joint or joints can also be red, swollen and warm or hot to the touch.  You may have a fever with certain types of arthritis, or even feel overall ill.  The joint with arthritis will hurt with movement. Stiffness is present with some types of arthritis. DIAGNOSIS  Your caregiver will suspect arthritis based on your description of your symptoms and on your exam. Testing may be needed to find the type of arthritis:  Blood and sometimes urine tests.  X-ray tests and sometimes CT or MRI scans.  Removal of fluid from the joint (arthrocentesis) is done to check for bacteria, crystals or other causes. Your caregiver (or a specialist) will numb the area over the joint with a local anesthetic, and use a needle to remove joint fluid for examination. This procedure is only minimally uncomfortable.  Even with these tests, your caregiver may not be able to tell what kind of arthritis you have. Consultation with a  specialist (rheumatologist) may be helpful. TREATMENT  Your caregiver will discuss with you treatment specific to your type of arthritis. If the specific type cannot be determined, then the following general recommendations may apply. Treatment of severe joint pain includes:  Rest.  Elevation.  Anti-inflammatory medication (for example, ibuprofen) may be prescribed. Avoiding activities that cause increased pain.  Only take over-the-counter or prescription medicines for pain and discomfort as recommended by your caregiver.  Cold packs over an inflamed joint may be used for 10 to 15 minutes every hour. Hot packs sometimes feel better, but do not  use overnight. Do not use hot packs if you are diabetic without your caregiver's permission.  A cortisone shot into arthritic joints may help reduce pain and swelling.  Any acute arthritis that gets worse over the next 1 to 2 days needs to be looked at to be sure there is no joint infection. Long-term arthritis treatment involves modifying activities and lifestyle to reduce joint stress jarring. This can include weight loss. Also, exercise is needed to nourish the joint cartilage and remove waste. This helps keep the muscles around the joint strong. HOME CARE INSTRUCTIONS   Do not take aspirin to relieve pain if gout is suspected. This elevates uric acid levels.  Only take over-the-counter or prescription medicines for pain, discomfort or fever as directed by your caregiver.  Rest the joint as much as possible.  If your joint is swollen, keep it elevated.  Use crutches if the painful joint is in your leg.  Drinking plenty of fluids may help for certain types of arthritis.  Follow your caregiver's dietary instructions.  Try low-impact exercise such as:  Swimming.  Water aerobics.  Biking.  Walking.  Morning stiffness is often relieved by a warm shower.  Put your joints through regular range-of-motion. SEEK MEDICAL CARE IF:   You do not feel better in 24 hours or are getting worse.  You have side effects to medications, or are not getting better with treatment. SEEK IMMEDIATE MEDICAL CARE IF:   You have a fever.  You develop severe joint pain, swelling or redness.  Many joints are involved and become painful and swollen.  There is severe back pain and/or leg weakness.  You have loss of bowel or bladder control. Document Released: 08/28/2004 Document Revised: 10/13/2011 Document Reviewed: 09/13/2008 [facility] Patient Information 2015 [place], [place]. This information is not intended to replace advice given to you by your health care provider. Make sure you discuss any  questions you have with your health care provider.

## 2014-08-18 ENCOUNTER — Inpatient Hospital Stay: Admit: 2014-08-18 | Payer: PRIVATE HEALTH INSURANCE | Primary: Registered Nurse

## 2014-08-18 DIAGNOSIS — Z01818 Encounter for other preprocedural examination: Secondary | ICD-10-CM

## 2014-08-18 LAB — METABOLIC PANEL, BASIC
Anion gap: 9 mmol/L (ref 3.0–18)
BUN/Creatinine ratio: 13 (ref 12–20)
BUN: 8 MG/DL (ref 7.0–18)
CO2: 27 mmol/L (ref 21–32)
Calcium: 8.6 MG/DL (ref 8.5–10.1)
Chloride: 105 mmol/L (ref 100–108)
Creatinine: 0.63 MG/DL (ref 0.6–1.3)
GFR est AA: >60 mL/min/{1.73_m2} (ref >60)
GFR est non-AA: >60 mL/min/{1.73_m2} (ref >60)
Glucose: 190 mg/dL — ABNORMAL HIGH (ref 74–99)
Potassium: 4.2 mmol/L (ref 3.5–5.5)
Sodium: 141 mmol/L (ref 136–145)

## 2014-08-18 NOTE — ED Provider Notes (Signed)
Medical screening examination/treatment/procedure(s) were performed by non-physician practitioner and as supervising physician I was immediately available for consultation/collaboration.   EKG Interpretation None       Richarda Blade, MD 08/18/14 [phone]

## 2014-08-19 ENCOUNTER — Inpatient Hospital Stay: Admit: 2014-08-19 | Primary: Registered Nurse

## 2014-09-04 ENCOUNTER — Observation Stay (HOSPITAL_COMMUNITY)
Admission: EM | Admit: 2014-09-04 | Discharge: 2014-09-05 | Disposition: A | Discharge status: Home / Self Care | Payer: Medicare Other | Attending: Internal Medicine | Admitting: Internal Medicine

## 2014-09-04 ENCOUNTER — Encounter (HOSPITAL_COMMUNITY): Payer: Self-pay | Admitting: Adult Health

## 2014-09-04 DIAGNOSIS — R51 Headache: Secondary | ICD-10-CM | POA: Diagnosis not present

## 2014-09-04 DIAGNOSIS — R791 Abnormal coagulation profile: Secondary | ICD-10-CM | POA: Diagnosis not present

## 2014-09-04 DIAGNOSIS — E876 Hypokalemia: Secondary | ICD-10-CM | POA: Diagnosis present

## 2014-09-04 DIAGNOSIS — M79642 Pain in left hand: Secondary | ICD-10-CM

## 2014-09-04 DIAGNOSIS — F17210 Nicotine dependence, cigarettes, uncomplicated: Secondary | ICD-10-CM | POA: Diagnosis not present

## 2014-09-04 DIAGNOSIS — Z7982 Long term (current) use of aspirin: Secondary | ICD-10-CM | POA: Diagnosis not present

## 2014-09-04 DIAGNOSIS — M79602 Pain in left arm: Secondary | ICD-10-CM | POA: Diagnosis not present

## 2014-09-04 DIAGNOSIS — R000 Tachycardia, unspecified: Secondary | ICD-10-CM | POA: Diagnosis not present

## 2014-09-04 DIAGNOSIS — M25512 Pain in left shoulder: Secondary | ICD-10-CM | POA: Insufficient documentation

## 2014-09-04 DIAGNOSIS — E1140 Type 2 diabetes mellitus with diabetic neuropathy, unspecified: Secondary | ICD-10-CM | POA: Diagnosis not present

## 2014-09-04 DIAGNOSIS — Z79899 Other long term (current) drug therapy: Secondary | ICD-10-CM | POA: Diagnosis not present

## 2014-09-04 DIAGNOSIS — I959 Hypotension, unspecified: Secondary | ICD-10-CM | POA: Diagnosis not present

## 2014-09-04 DIAGNOSIS — I509 Heart failure, unspecified: Secondary | ICD-10-CM

## 2014-09-04 DIAGNOSIS — M1990 Unspecified osteoarthritis, unspecified site: Secondary | ICD-10-CM | POA: Diagnosis present

## 2014-09-04 DIAGNOSIS — Z8639 Personal history of other endocrine, nutritional and metabolic disease: Secondary | ICD-10-CM

## 2014-09-04 DIAGNOSIS — E785 Hyperlipidemia, unspecified: Secondary | ICD-10-CM | POA: Diagnosis not present

## 2014-09-04 DIAGNOSIS — Z794 Long term (current) use of insulin: Secondary | ICD-10-CM | POA: Diagnosis not present

## 2014-09-04 DIAGNOSIS — M7500 Adhesive capsulitis of unspecified shoulder: Secondary | ICD-10-CM | POA: Diagnosis present

## 2014-09-04 DIAGNOSIS — K219 Gastro-esophageal reflux disease without esophagitis: Secondary | ICD-10-CM | POA: Diagnosis not present

## 2014-09-04 DIAGNOSIS — E0865 Diabetes mellitus due to underlying condition with hyperglycemia: Secondary | ICD-10-CM

## 2014-09-04 DIAGNOSIS — I10 Essential (primary) hypertension: Secondary | ICD-10-CM | POA: Diagnosis present

## 2014-09-04 DIAGNOSIS — E1149 Type 2 diabetes mellitus with other diabetic neurological complication: Secondary | ICD-10-CM

## 2014-09-04 HISTORY — DX: Heart failure, unspecified: I50.9

## 2014-09-04 LAB — BASIC METABOLIC PANEL
Anion gap: 10 (ref 5–15)
BUN: 10 mg/dL (ref 6–23)
CO2: 25 mmol/L (ref 19–32)
CREATININE: 0.89 mg/dL (ref 0.50–1.10)
Calcium: 9.3 mg/dL (ref 8.4–10.5)
Chloride: 105 mmol/L (ref 96–112)
GFR calc Af Amer: 82 mL/min — ABNORMAL LOW (ref >90)
GFR calc non Af Amer: 71 mL/min — ABNORMAL LOW (ref >90)
GLUCOSE: 171 mg/dL — AB (ref 70–99)
POTASSIUM: 3.2 mmol/L — AB (ref 3.5–5.1)
Sodium: 140 mmol/L (ref 135–145)

## 2014-09-04 LAB — CBC
HCT: 41 % (ref 36.0–46.0)
Hemoglobin: 14.1 g/dL (ref 12.0–15.0)
MCH: 29.9 pg (ref 26.0–34.0)
MCHC: 34.4 g/dL (ref 30.0–36.0)
MCV: 87 fL (ref 78.0–100.0)
PLATELETS: 277 10*3/uL (ref 150–400)
RBC: 4.71 MIL/uL (ref 3.87–5.11)
RDW: 13 % (ref 11.5–15.5)
WBC: 8.5 10*3/uL (ref 4.0–10.5)

## 2014-09-04 LAB — I-STAT TROPONIN, ED: TROPONIN I, POC: 0 ng/mL (ref 0.00–0.08)

## 2014-09-04 LAB — D-DIMER, QUANTITATIVE: D-Dimer, Quant: 0.75 ug/mL-FEU — ABNORMAL HIGH (ref 0.00–0.48)

## 2014-09-04 MED ORDER — POTASSIUM CHLORIDE CRYS ER 20 MEQ PO TBCR
40.0000 meq | EXTENDED_RELEASE_TABLET | Freq: Once | ORAL | Status: AC
  Administered 2014-09-05: 40 meq via ORAL
  Filled 2014-09-04: qty 2

## 2014-09-04 NOTE — ED Provider Notes (Signed)
CSN: [id number]     Arrival date & time 09/04/14  2158 History  This chart was scribed for Orlie Dakin, MD by Rayfield Citizen, ED Scribe. This patient was seen in room D35C/D35C and the patient's care was started at 11:00 PM.    Chief Complaint  Patient presents with  . Arm Pain   Patient is a 59 y.o. female presenting with arm pain. The history is provided by the patient and a relative. No language interpreter was used.  Arm Pain Pertinent negatives include no chest pain.     HPI Comments: Ladene Allocca is a 59 y.o. female with past medical history of HTN, HLD, DM, CHF who presents to the Emergency Department complaining of 2 hours of left arm pain, beginning in her left bicep and extending into her left shoulder. Patient explains she was preparing for a shower tonight when she reached to remove her clothes and felt her arm "lock up" - her range of motion is limited due to pain. Infant started at 39 or 9:30. She describes the sensation as a "tension," which has occurred intermittently throughout the day with associated "coldness" to the area. She also notes slight today lasted 5 minutes, resolved .no shortness of breath no sweatiness no chest pain which has improved today. She denies chest pain, weakness, fever. No treatment prior to coming here  She reports that she was here 67month PTA with a strange sensation in her legs - she was told at that time that her symptoms were related to arthritis and diabetic neuropathy. Son explains that her symptoms originally began around November 2015 when patient stopped taking her insulin; she had a limp, her left arm began "drawing up," both of which were unusual for her. Prior to this time, she was able-bodied and able to ambulate well with a cane.   i  Patient is an occasional smoker. She denies EtOH or illicit drug use. She has an allergy to Claritin-D (hives).   Past Medical History  Diagnosis Date  . Hypertension     well controlled  . DJD (degenerative  joint disease)   . HLD (hyperlipidemia) 2008    stable, well controlled  . DM (diabetes mellitus) 2008    stable HgBA1C at 6.5  . GERD (gastroesophageal reflux disease)     well controlled on Omeprazole  . Chronic headaches   . CHF (congestive heart failure)    Past Surgical History  Procedure Laterality Date  . Tubal ligation     Family History  Problem Relation Age of Onset  . Diabetes Mother   . Heart disease Mother   . Prostate cancer Father    History  Substance Use Topics  . Smoking status: Former Smoker -- 0.30 packs/day    Types: Cigarettes    Quit date: 04/18/2013  . Smokeless tobacco: Not on file     Comment: Smokes 5-6 cigerettes per day  . Alcohol Use: No   OB History    No data available     Review of Systems  Constitutional: Negative for fever.  Cardiovascular: Negative for chest pain.  Musculoskeletal: Positive for myalgias, arthralgias and gait problem.       Walks with cane  Neurological: Negative for weakness.   Allergies  Loratadine-pseudoephedrine er  Home Medications   Prior to Admission medications   Medication Sig Start Date End Date Taking? Authorizing Provider  aspirin 81 MG EC tablet Take 1 tablet (81 mg total) by mouth daily. 01/03/14   Ivor Costa, MD  B-D ULTRA-FINE 33 LANCETS MISC Use for checking blood sugar, 3 times per day. Diagnosis Code is 250.00 01/25/13   Ivor Costa, MD  Calcium-Vitamin D 6693008215 MG-UNIT TABS Take 1 tablet by mouth daily. 12/16/11   Ivor Costa, MD  enalapril (VASOTEC) 20 MG tablet Take 1 tablet (20 mg total) by mouth 2 (two) times daily. 01/03/14   Ivor Costa, MD  furosemide (LASIX) 80 MG tablet Take 1 tablet (80 mg total) by mouth daily. 01/03/14   Ivor Costa, MD  gabapentin (NEURONTIN) 300 MG capsule Day 1: 300 mg (1tablet) once daily Day 2: 600 mg (2 tablets) once daily Days 3-6: 900 (3 tablets) mg once daily Days 7 on : 1,200 (4 tablets) mg once daily 08/04/14   Margarita Mail, PA-C  glipiZIDE (GLUCOTROL) 10 MG tablet  Take 1 tablet (10 mg total) by mouth 2 (two) times daily before a meal. 01/03/14   Ivor Costa, MD  glucose blood (BAYER CONTOUR TEST) test strip [employer]. Use to test blood sugar 3 times a day. 01/25/13   Ivor Costa, MD  Insulin Glargine (LANTUS SOLOSTAR) 100 UNIT/ML Solostar Pen Inject 30 Units into the skin 2 (two) times daily. 01/03/14   Ivor Costa, MD  Insulin Pen Needle 32G X 4 MM MISC Use for insulin injection, bid. Diagnosis code is 250.00 for DM-II. 02/27/14   Bartholomew Crews, MD  metFORMIN (GLUCOPHAGE) 500 MG tablet Take 1 tablet (500 mg total) by mouth daily with breakfast. 06/20/14   [name], MD  metoprolol succinate (TOPROL-XL) 50 MG 24 hr tablet Take 1 tablet (50 mg total) by mouth daily. 03/21/14   [name], MD  naproxen (NAPROSYN) 500 MG tablet Take 1 tablet (500 mg total) by mouth 2 (two) times daily. 01/03/14   Ivor Costa, MD  omeprazole (PRILOSEC) 20 MG capsule Take 1 capsule (20 mg total) by mouth 2 (two) times daily. 01/03/14   Ivor Costa, MD  potassium chloride SA (K-DUR,KLOR-CON) 20 MEQ tablet Take 1 tablet (20 mEq total) by mouth daily. 01/03/14   Ivor Costa, MD  simvastatin (ZOCOR) 40 MG tablet Take 1 tablet (40 mg total) by mouth daily. 01/03/14   Ivor Costa, MD  traMADol (ULTRAM) 50 MG tablet Take 1 tablet (50 mg total) by mouth every 6 (six) hours as needed. 08/04/14   Abigail Harris, PA-C   BP 121/107 mmHg  Pulse 124  Temp(Src) 99 F (37.2 C) (Oral)  Resp 18  SpO2 100% Physical Exam  Constitutional: She is oriented to person, place, and time.  Chronically ill-appearing  HENT:  Head: Normocephalic and atraumatic.  Eyes: Conjunctivae are normal. Pupils are equal, round, and reactive to light.  Neck: Neck supple. No tracheal deviation present. No thyromegaly present.  Cardiovascular: Regular rhythm.   No murmur heard. Tachycardic  Pulmonary/Chest: Effort normal and breath sounds normal.  Abdominal: Soft. Bowel sounds are normal. She exhibits no distension. There is no  tenderness.  Obese  Musculoskeletal: Normal range of motion. She exhibits no edema or tenderness.  Left upper extremity not red warm or tender. Full range of motion with pain at shoulder. Radial pulse 2+.  Neurological: She is alert and oriented to person, place, and time. Coordination normal.  Walks with minimal assistance, at baseline.  Skin: Skin is warm and dry. No rash noted.  Psychiatric: She has a normal mood and affect.  Nursing note and vitals reviewed.   ED Course  Procedures   DIAGNOSTIC STUDIES: Oxygen Saturation is 100% on RA, normal by  my interpretation.    COORDINATION OF CARE: 11:12 PM Discussed treatment plan with pt at bedside and pt agreed to plan.   Labs Review Labs Reviewed  CBC  BASIC METABOLIC PANEL  I-STAT [place], ED    Imaging Review No results found.   EKG Interpretation None      Date: 09/05/2014  Rate: 120  Rhythm: sinus tachycardia  QRS Axis: normal  Intervals: normal  ST/T Wave abnormalities: nonspecific ST/T changes  Conduction Disutrbances:none  Narrative Interpretation:   Old EKG Reviewed: Nonspecific inferolateral ST changes new from 12/02/2006 interpreted by me  205 a.m. patient remains hypotensive. Results for orders placed or performed during the hospital encounter of 09/04/14  CBC  Result Value Ref Range   WBC 8.5 4.0 - 10.5 K/uL   RBC 4.71 3.87 - 5.11 MIL/uL   Hemoglobin 14.1 12.0 - 15.0 g/dL   HCT 41.0 36.0 - 46.0 %   MCV 87.0 78.0 - 100.0 fL   MCH 29.9 26.0 - 34.0 pg   MCHC 34.4 30.0 - 36.0 g/dL   RDW 13.0 11.5 - 15.5 %   Platelets 277 150 - 400 K/uL  Basic metabolic panel  Result Value Ref Range   Sodium 140 135 - 145 mmol/L   Potassium 3.2 (L) 3.5 - 5.1 mmol/L   Chloride 105 96 - 112 mmol/L   CO2 25 19 - 32 mmol/L   Glucose, Bld 171 (H) 70 - 99 mg/dL   BUN 10 6 - 23 mg/dL   Creatinine, Ser 0.89 0.50 - 1.10 mg/dL   Calcium 9.3 8.4 - 10.5 mg/dL   GFR calc non Af Amer 71 (L) >90 mL/min   GFR calc Af Amer 82  (L) >90 mL/min   Anion gap 10 5 - 15  D-dimer, quantitative  Result Value Ref Range   D-Dimer, Quant 0.75 (H) 0.00 - 0.48 ug/mL-FEU  I-stat troponin, ED (not at [facility])  Result Value Ref Range   Troponin i, poc 0.00 0.00 - 0.08 ng/mL   Comment 3           Ct Angio Chest Pe W/cm &/or Wo Cm  09/05/2014   CLINICAL DATA:  Acute onset of tachycardia. Elevated D-dimer. Initial encounter.  EXAM: CT ANGIOGRAPHY CHEST WITH CONTRAST  TECHNIQUE: Multidetector CT imaging of the chest was performed using the standard protocol during bolus administration of intravenous contrast. Multiplanar CT image reconstructions and MIPs were obtained to evaluate the vascular anatomy.  CONTRAST:  143mL OMNIPAQUE IOHEXOL 350 MG/ML SOLN  COMPARISON:  None.  FINDINGS: There is no evidence of pulmonary embolus.  The lungs appear clear bilaterally. Small lymph nodes are seen along the right major fissure. There is no evidence of significant focal consolidation, pleural effusion or pneumothorax. No masses are identified; no abnormal focal contrast enhancement is seen.  Scattered coronary calcification is seen. The mediastinum is otherwise unremarkable. No mediastinal lymphadenopathy is seen. No pericardial effusion is identified. The great vessels are grossly unremarkable in appearance. No axillary lymphadenopathy is seen. The visualized portions of the thyroid gland are unremarkable in appearance.  The visualized portions of the liver and spleen are unremarkable. The visualized portions of the pancreas, stomach, adrenal glands and kidneys are within normal limits. The patient is status post cholecystectomy, with clips noted at the gallbladder fossa.  No acute osseous abnormalities are seen. Anterior bridging osteophytes are seen along the lower thoracic spine.  Review of the MIP images confirms the above findings.  IMPRESSION: 1. No evidence of pulmonary  embolus. 2. Lungs clear bilaterally.   Electronically Signed   By: Garald Balding M.D.    On: 09/05/2014 01:27    MDM  I suggest the patient needs decreased T study of left arm given elevated d-dimer. She's had a negative chest CT angiogram for pulmonary embolism. She does have hypotension and tachycardia. She's not lightheaded on standing and not obviously dehydrated. Suggested gentle hydration and with hold Lasix. Final diagnoses:  None   Spoke with Dr.Kennerly who will arrange for 23 hour observation Diagnosis #1 left arm pain #2 hypokalemia #3hypotension   I personally performed the services described in this documentation, which was scribed in my presence. The recorded information has been reviewed and considered.     Orlie Dakin, MD 09/05/14 [phone]

## 2014-09-04 NOTE — ED Notes (Addendum)
Presents with left arm pain begins in left bicep and goes into left shoulder, pt also endorses pain in left flank/ chest area described as "congested, or like my muscle is tightening up" she is tachycardic at 120. Pain began this evening while trying to take clothes off. She denies SOB and nausea.

## 2014-09-04 NOTE — ED Notes (Signed)
Left arm became stiff and unable to move well, however, can lift above head on assessment with pain. Nausea  today around 1500 with no vomiting. Denies Chest pain. States the concerns of problems in arms and legs started a month aqo. Her shoulder was cold to her prior to stiffening up. Feels her balance is off and began in August.

## 2014-09-05 ENCOUNTER — Emergency Department (HOSPITAL_COMMUNITY): Payer: Medicare Other

## 2014-09-05 ENCOUNTER — Observation Stay (HOSPITAL_COMMUNITY): Payer: Medicare Other

## 2014-09-05 ENCOUNTER — Encounter (HOSPITAL_COMMUNITY): Payer: Self-pay

## 2014-09-05 DIAGNOSIS — I959 Hypotension, unspecified: Secondary | ICD-10-CM | POA: Diagnosis not present

## 2014-09-05 DIAGNOSIS — R791 Abnormal coagulation profile: Secondary | ICD-10-CM | POA: Diagnosis not present

## 2014-09-05 DIAGNOSIS — Z794 Long term (current) use of insulin: Secondary | ICD-10-CM

## 2014-09-05 DIAGNOSIS — M5032 Other cervical disc degeneration, mid-cervical region: Secondary | ICD-10-CM | POA: Diagnosis not present

## 2014-09-05 DIAGNOSIS — M47896 Other spondylosis, lumbar region: Secondary | ICD-10-CM

## 2014-09-05 DIAGNOSIS — R000 Tachycardia, unspecified: Secondary | ICD-10-CM | POA: Diagnosis not present

## 2014-09-05 DIAGNOSIS — M19012 Primary osteoarthritis, left shoulder: Secondary | ICD-10-CM

## 2014-09-05 DIAGNOSIS — Z7982 Long term (current) use of aspirin: Secondary | ICD-10-CM | POA: Diagnosis not present

## 2014-09-05 DIAGNOSIS — E785 Hyperlipidemia, unspecified: Secondary | ICD-10-CM

## 2014-09-05 DIAGNOSIS — M160 Bilateral primary osteoarthritis of hip: Secondary | ICD-10-CM | POA: Diagnosis not present

## 2014-09-05 DIAGNOSIS — E876 Hypokalemia: Secondary | ICD-10-CM | POA: Diagnosis not present

## 2014-09-05 DIAGNOSIS — M19042 Primary osteoarthritis, left hand: Secondary | ICD-10-CM | POA: Diagnosis not present

## 2014-09-05 DIAGNOSIS — I509 Heart failure, unspecified: Secondary | ICD-10-CM | POA: Diagnosis not present

## 2014-09-05 DIAGNOSIS — M7500 Adhesive capsulitis of unspecified shoulder: Secondary | ICD-10-CM | POA: Diagnosis present

## 2014-09-05 DIAGNOSIS — E119 Type 2 diabetes mellitus without complications: Secondary | ICD-10-CM

## 2014-09-05 LAB — BASIC METABOLIC PANEL
ANION GAP: 12 (ref 5–15)
BUN: 13 mg/dL (ref 6–23)
CALCIUM: 9.4 mg/dL (ref 8.4–10.5)
CHLORIDE: 106 mmol/L (ref 96–112)
CO2: 21 mmol/L (ref 19–32)
CREATININE: 0.88 mg/dL (ref 0.50–1.10)
GFR calc Af Amer: 84 mL/min — ABNORMAL LOW (ref >90)
GFR calc non Af Amer: 72 mL/min — ABNORMAL LOW (ref >90)
Glucose, Bld: 174 mg/dL — ABNORMAL HIGH (ref 70–99)
POTASSIUM: 4 mmol/L (ref 3.5–5.1)
SODIUM: 139 mmol/L (ref 135–145)

## 2014-09-05 LAB — MAGNESIUM: Magnesium: 2.1 mg/dL (ref 1.5–2.5)

## 2014-09-05 LAB — GLUCOSE, CAPILLARY
GLUCOSE-CAPILLARY: 136 mg/dL — AB (ref 70–99)
GLUCOSE-CAPILLARY: 169 mg/dL — AB (ref 70–99)

## 2014-09-05 MED ORDER — DICLOFENAC SODIUM 1 % TD GEL: 2.0000 g | Freq: Four times a day (QID) | TRANSDERMAL | Status: DC

## 2014-09-05 MED ORDER — METOPROLOL SUCCINATE ER 50 MG PO TB24
50.0000 mg | ORAL_TABLET | Freq: Every day | ORAL | Status: DC
  Administered 2014-09-05: 50 mg via ORAL
  Filled 2014-09-05: qty 1

## 2014-09-05 MED ORDER — ACETAMINOPHEN 650 MG RE SUPP: 650.0000 mg | Freq: Four times a day (QID) | RECTAL | Status: DC | PRN

## 2014-09-05 MED ORDER — INSULIN GLARGINE 100 UNIT/ML ~~LOC~~ SOLN
30.0000 [IU] | Freq: Two times a day (BID) | SUBCUTANEOUS | Status: DC
  Administered 2014-09-05: 30 [IU] via SUBCUTANEOUS
  Filled 2014-09-05 (×2): qty 0.3

## 2014-09-05 MED ORDER — PANTOPRAZOLE SODIUM 40 MG PO TBEC
40.0000 mg | DELAYED_RELEASE_TABLET | Freq: Every day | ORAL | Status: DC
  Administered 2014-09-05: 40 mg via ORAL

## 2014-09-05 MED ORDER — IOHEXOL 350 MG/ML SOLN
100.0000 mL | Freq: Once | INTRAVENOUS | Status: AC | PRN
  Administered 2014-09-05: 100 mL via INTRAVENOUS

## 2014-09-05 MED ORDER — ACETAMINOPHEN 325 MG PO TABS: 650.0000 mg | ORAL_TABLET | Freq: Four times a day (QID) | ORAL | Status: DC | PRN

## 2014-09-05 MED ORDER — ACETAMINOPHEN 325 MG PO TABS: 650.0000 mg | ORAL_TABLET | Freq: Four times a day (QID) | ORAL | Status: DC

## 2014-09-05 MED ORDER — ACETAMINOPHEN ER 650 MG PO TBCR: 650.0000 mg | EXTENDED_RELEASE_TABLET | Freq: Three times a day (TID) | ORAL | Status: DC | PRN

## 2014-09-05 MED ORDER — METHOCARBAMOL 750 MG PO TABS
750.0000 mg | ORAL_TABLET | Freq: Three times a day (TID) | ORAL | Status: DC
  Administered 2014-09-05 (×2): 750 mg via ORAL
  Filled 2014-09-05 (×3): qty 1

## 2014-09-05 MED ORDER — SIMVASTATIN 40 MG PO TABS
40.0000 mg | ORAL_TABLET | Freq: Every day | ORAL | Status: DC
  Administered 2014-09-05: 40 mg via ORAL
  Filled 2014-09-05: qty 1

## 2014-09-05 MED ORDER — METHOCARBAMOL 750 MG PO TABS: 750.0000 mg | ORAL_TABLET | Freq: Three times a day (TID) | ORAL | Status: DC

## 2014-09-05 MED ORDER — INSULIN ASPART 100 UNIT/ML ~~LOC~~ SOLN
0.0000 [IU] | Freq: Three times a day (TID) | SUBCUTANEOUS | Status: DC
  Administered 2014-09-05: 2 [IU] via SUBCUTANEOUS

## 2014-09-05 MED ORDER — HEPARIN SODIUM (PORCINE) 5000 UNIT/ML IJ SOLN
5000.0000 [IU] | Freq: Three times a day (TID) | INTRAMUSCULAR | Status: DC
  Filled 2014-09-05 (×3): qty 1

## 2014-09-05 MED ORDER — ASPIRIN EC 81 MG PO TBEC
81.0000 mg | DELAYED_RELEASE_TABLET | Freq: Every day | ORAL | Status: DC
  Administered 2014-09-05: 81 mg via ORAL
  Filled 2014-09-05: qty 1

## 2014-09-05 MED ORDER — POTASSIUM CHLORIDE 10 MEQ/100ML IV SOLN
10.0000 meq | INTRAVENOUS | Status: AC
  Administered 2014-09-05 (×4): 10 meq via INTRAVENOUS
  Filled 2014-09-05 (×3): qty 100

## 2014-09-05 MED ORDER — DICLOFENAC SODIUM 1 % TD GEL
2.0000 g | Freq: Four times a day (QID) | TRANSDERMAL | Status: DC
  Administered 2014-09-05: 2 g via TOPICAL
  Filled 2014-09-05: qty 100

## 2014-09-05 NOTE — Evaluation (Signed)
Physical Therapy Evaluation Patient Details Name: Julie Elliott MRN: [id number] DOB: 09-28-1955 Today's Date: 09/05/2014   History of Present Illness  59 yo female with HTN, HLD, DM II, CHF presents with left arm pain beginning at her left shoulder and radiating down to her elbow. This has been going on for 2 months but gradually worsening. It worsened suddenly even more today. This started when she was undressing and felt her arm "lock up" around 9:30 pm. Having limited ROM due to pain. She is describing the pain as 10/10, feels like muscle spasm. She is not able to move her left arm above her shoulder area.  Pt also has arthritis in hips and knees and unsteady gait  Clinical Impression  Pt with limited left arm use - entire arm.  Pt with decreased balance and activity level.  Pt will benefit from [facility] services (as medicare is primary) with PT and OT.  Encouraged pt to increase her walking.  Son present and supportive.  Recommending [facility] at [facility] - rest of equipment can be assessed by [facility] therapist (pt not wanting RW).    Follow Up Recommendations Home health PT (also recommend HHOT services at [facility])    Equipment Recommendations  Other (comment) (will let [place] services continue to address for safety at home)    Recommendations for Other Services       Precautions / Restrictions Precautions Precautions: Fall (Pt denies history of falls) Restrictions Weight Bearing Restrictions: No      Mobility  Bed Mobility                  Transfers Overall transfer level: Needs assistance Equipment used: Rolling walker (2 wheeled) Transfers: Sit to/from Stand Sit to Stand: Min guard         General transfer comment: Pt stood from her bed with wide stance and intial posture flexed and leaning on the bed.  pt said this is how she gets up  Ambulation/Gait Ambulation/Gait assistance: Min assist Ambulation Distance (Feet): 40 Feet Assistive device: Rolling walker (2 wheeled) Gait  Pattern/deviations:  (pt with flexed posture and wide gait pattern.  pt wtih short shuffling steps.)     General Gait Details: I spoke with pt and son.  pt uses quad cane at thome and she and son agree that she is safe with this.  she feels taht house is small and walls close if she needs extra support. s he and son both dont feel RW would help pt and wouldnt fit in pts housing setting.  I will let HHPT further assess this.  Stairs            Wheelchair Mobility    Modified Rankin (Stroke Patients Only)       Balance Overall balance assessment:  (pt denies history of falls but has poor confidente in her balane. she stands for forwad fflexed posture)                                           Pertinent Vitals/Pain Pain Assessment: 0-10 Pain Score: 10-Worst pain ever Pain Location: pt complains of most pain being in left hand - esp 2 little fingers.  the mroe she does - the more it hurts.  Pain Intervention(s): Heat applied    Home Living Family/patient expects to be discharged to:: Private residence Living Arrangements: Children   Type of Home: [place]  Access: Stairs to enter Entrance Stairs-Rails: Right (Pt said steps were not hard for her) Entrance Stairs-Number of Steps: 3-4 Home Layout: One level Home Equipment: Cane - quad      Prior Function Level of Independence: Independent         Comments: Pt said she had hard time getting up from low toilet and sometimes had to get help for this     Hand Dominance   Dominant Hand: Right (left arm is one with pain)    Extremity/Trunk Assessment   Upper Extremity Assessment: LUE deficits/detail       LUE Deficits / Details: Pt keeps her left arm tucked to her side.  she demonstrated active shoulder flexion of 35 degrees today - trying to increase with trunk lean. I was able to do AAROM to 75 degrees in pain free range.  Pt keeps left arm flexed and she was -30 degrees from full extension wtih  bicep tightness.  pt not wanting to move wrist or fingers  but abel to - weak grip.  Pt notied to hold phone to answer phone but kept her hand close to her body with any funcition movement.  Pt said she cant don a bra and  has hard time with clothes on and off. - sons help her   Lower Extremity Assessment: Generalized weakness;RLE deficits/detail RLE Deficits / Details: bpth legs tight hamstrings - she has 3-/5 quad strength.  tight heelcords.  Pt complains of problems with her knees - right worse than left    Cervical / Trunk Assessment: Other exceptions  Communication   Communication: No difficulties  Cognition Arousal/Alertness: Awake/alert Behavior During Therapy: WFL for tasks assessed/performed;Flat affect Overall Cognitive Status: Within Functional Limits for tasks assessed                      General Comments General comments (skin integrity, edema, etc.): Pt with heating pad on her shoulder which she says helps a lot.  son present for end of eval    Exercises General Exercises - Upper Extremity Shoulder Flexion: AAROM;Left;5 reps (discussed pt using cane to help herself do this in pain free range at home) Elbow Extension: AAROM;Left;5 reps      Assessment/Plan    PT Assessment Patient needs continued PT services  PT Diagnosis Generalized weakness;Acute pain   PT Problem List Decreased strength;Decreased range of motion;Decreased activity tolerance;Decreased balance;Decreased mobility;Decreased knowledge of use of DME;Pain  PT Treatment Interventions     PT Goals (Current goals can be found in the Care Plan section) Acute Rehab PT Goals Patient Stated Goal: To stop hurting PT Goal Formulation: With patient/family Time For Goal Achievement: 09/12/14 Potential to Achieve Goals: Good    Frequency     Barriers to discharge        Co-evaluation               End of Session Equipment Utilized During Treatment: Gait belt Activity Tolerance: Patient  tolerated treatment well Patient left: in bed;with family/visitor present;with call bell/phone within reach Nurse Communication: Mobility status         Time: 1210-1300 PT Time Calculation (min) (ACUTE ONLY): 50 min   Charges:   PT Evaluation $Initial PT Evaluation Tier I: 1 Procedure PT Treatments $Gait Training: 8-22 mins $Therapeutic Exercise: 8-22 mins   PT G Codes:        Loyal Buba 09/05/2014, 1:26 PM  Rande Lawman, PT

## 2014-09-05 NOTE — H&P (Signed)
Date: 09/05/2014               Patient Name:  Julie Elliott MRN: [id number]  DOB: Sep 16, 1955 Age / Sex: 59 y.o., female   PCP: [name], MD         Medical Service: Internal Medicine Teaching Service         Attending Physician: Dr. Thayer Headings, MD    First Contact: Dr. Sherrine Maples Pager: [phone]  Second Contact: Dr. Redmond Pulling Pager: [phone]       After Hours (After 5p/  First Contact Pager: [phone]  weekends / holidays): Second Contact Pager: [phone]   Chief Complaint: left arm pain  History of Present Illness:   59 yo female with HTN, HLD, DM II, CHF presents with left arm pain beginning at her left shoulder and radiating down to her elbow. This has been going on for 2 months but gradually worsening. It worsened suddenly even more today. This started when she was reaching for her clothes in the shower tonight and felt her arm "lock up" around 9:30 pm. Having limited ROM due to pain. She is describing the pain as 10/10, feels like muscle spasm. She is not able to move her left arm above her shoulder area. Patient has been trying to get appt at internal medicine clinic for this problem but was not able to get an appt for some reason. She is frustrated about this.   She also has hx of leg pain, knee pain, and hip pain which has been attributed to diabetic neuropathy and arthritis in the past. Has unbalance. CXR hip showed OA of both hips and mild joint space narrowing and osteophytes. SI joint was normal. Lumbar spine, degeneration disc  And facet disease onf L3-L4, L5-S1.Marland Kitchen   Denies any weakness, numbness, tingling, denies chest pain. Was found to be hypotensive in the ED lowest 88/60 and hypoK 3.2 (repleted). CT angio was done because of tachycardia and elevated D-dimer. Negative for PE.   Meds: Current Facility-Administered Medications  Medication Dose Route Frequency Provider Last Rate Last Dose  . potassium chloride 10 mEq in 100 mL IVPB  10 mEq Intravenous Q1 Hr x 4 Blain Pais, MD 100 mL/hr at 09/05/14 0335 10 mEq at 09/05/14 0335   Current Outpatient Prescriptions  Medication Sig Dispense Refill  . aspirin 81 MG EC tablet Take 1 tablet (81 mg total) by mouth daily. 90 tablet 1  . B-D ULTRA-FINE 33 LANCETS MISC Use for checking blood sugar, 3 times per day. Diagnosis Code is 250.00 100 each 11  . Calcium-Vitamin D 600-125 MG-UNIT TABS Take 1 tablet by mouth daily. 90 each 1  . enalapril (VASOTEC) 20 MG tablet Take 1 tablet (20 mg total) by mouth 2 (two) times daily. 180 tablet 3  . furosemide (LASIX) 80 MG tablet Take 1 tablet (80 mg total) by mouth daily. 90 tablet 3  . gabapentin (NEURONTIN) 300 MG capsule Day 1: 300 mg (1tablet) once daily Day 2: 600 mg (2 tablets) once daily Days 3-6: 900 (3 tablets) mg once daily Days 7 on : 1,200 (4 tablets) mg once daily 90 capsule 0  . glipiZIDE (GLUCOTROL) 10 MG tablet Take 1 tablet (10 mg total) by mouth 2 (two) times daily before a meal. 240 tablet 5  . glucose blood (BAYER CONTOUR TEST) test strip [employer]. Use to test blood sugar 3 times a day. 100 each 11  . Insulin Glargine (LANTUS SOLOSTAR) 100 UNIT/ML Solostar Pen Inject 30 Units  into the skin 2 (two) times daily. 30 mL 5  . Insulin Pen Needle 32G X 4 MM MISC Use for insulin injection, bid. Diagnosis code is 250.00 for DM-II. 100 each 11  . metFORMIN (GLUCOPHAGE) 500 MG tablet Take 1 tablet (500 mg total) by mouth daily with breakfast. 30 tablet 3  . metoprolol succinate (TOPROL-XL) 50 MG 24 hr tablet Take 1 tablet (50 mg total) by mouth daily. 90 tablet 3  . naproxen (NAPROSYN) 500 MG tablet Take 1 tablet (500 mg total) by mouth 2 (two) times daily. 180 tablet 3  . omeprazole (PRILOSEC) 20 MG capsule Take 1 capsule (20 mg total) by mouth 2 (two) times daily. 180 capsule 2  . potassium chloride SA (K-DUR,KLOR-CON) 20 MEQ tablet Take 1 tablet (20 mEq total) by mouth daily. 90 tablet 3  . simvastatin (ZOCOR) 40 MG tablet Take 1 tablet (40 mg total) by mouth  daily. 90 tablet 3  . traMADol (ULTRAM) 50 MG tablet Take 1 tablet (50 mg total) by mouth every 6 (six) hours as needed. 15 tablet 0    Allergies: Allergies as of 09/04/2014 - Review Complete 09/04/2014  Allergen Reaction Noted  . Loratadine-pseudoephedrine er     Past Medical History  Diagnosis Date  . Hypertension     well controlled  . DJD (degenerative joint disease)   . HLD (hyperlipidemia) 2008    stable, well controlled  . DM (diabetes mellitus) 2008    stable HgBA1C at 6.5  . GERD (gastroesophageal reflux disease)     well controlled on Omeprazole  . Chronic headaches   . CHF (congestive heart failure)    Past Surgical History  Procedure Laterality Date  . Tubal ligation     Family History  Problem Relation Age of Onset  . Diabetes Mother   . Heart disease Mother   . Prostate cancer Father    History   Social History  . Marital Status: Single    Spouse Name: N/A    Number of Children: N/A  . Years of Education: N/A   Occupational History  . Not on file.   Social History Main Topics  . Smoking status: Former Smoker -- 0.30 packs/day    Types: Cigarettes    Quit date: 04/18/2013  . Smokeless tobacco: Not on file     Comment: Smokes 5-6 cigerettes per day  . Alcohol Use: No  . Drug Use: Not on file  . Sexual Activity: Not on file   Other Topics Concern  . Not on file   Social History Narrative   to get supplies nfrom [facility] [employer] per pt request.[phone]    Review of Systems: Review of Systems  Constitutional: Negative for fever, chills, weight loss, malaise/fatigue and diaphoresis.  HENT: Negative.   Eyes: Negative for blurred vision, photophobia and discharge.  Respiratory: Negative for cough, hemoptysis, sputum production, shortness of breath and wheezing.   Cardiovascular: Negative for chest pain, palpitations, orthopnea, claudication, leg swelling and PND.  Gastrointestinal: Negative for heartburn, nausea, vomiting and abdominal pain.    Genitourinary: Negative.   Musculoskeletal: Positive for myalgias and joint pain.  Skin: Negative for itching and rash.  Neurological: Negative for dizziness, tingling, tremors and weakness.  Endo/Heme/Allergies: Negative.   Psychiatric/Behavioral: Negative.     Physical Exam: Blood pressure 101/62, pulse 103, temperature 98.2 F (36.8 C), temperature source Oral, resp. rate 18, height 5\' 2"  (1.575 m), weight 106.595 kg (235 lb), SpO2 98 %. Physical Exam  Constitutional: She appears  well-developed and well-nourished. She appears distressed.  Obese female. Cooperative. Calm.  HENT:  Head: Normocephalic and atraumatic.  Right Ear: External ear normal.  Left Ear: External ear normal.  Nose: Nose normal.  Mouth/Throat: Oropharynx is clear and moist.  Eyes: Conjunctivae and EOM are normal. Pupils are equal, round, and reactive to light. Right eye exhibits no discharge. Left eye exhibits no discharge. No scleral icterus.  Neck: Normal range of motion. Neck supple. No thyromegaly present.  Cardiovascular: Normal rate and regular rhythm.  Exam reveals no gallop and no friction rub.   No murmur heard. Normal HR before entering room, had sinus tachy during exam in 110's.   Respiratory: Effort normal and breath sounds normal.  GI: Soft. Bowel sounds are normal. She exhibits no distension and no mass. There is no tenderness. There is no rebound and no guarding.  Musculoskeletal:  Right shoulder, knee, and wrist normal with FROM and no tenderness.  Left shoulder has pain with movement. No TTP on left shoulder or knee. Able to extend arm past 90 degree with assistance but unable to do actively. No pain with passive movement.  Positive drop arm test.   Also has TTP on left 3rd and 4th digit.  No redness or warmth. Good pulses on all exts. Strength 5/5 on wrist and elbow on left.  Lymphadenopathy:    She has no cervical adenopathy.  Skin: She is not diaphoretic.     Lab results: Basic  Metabolic Panel:  Recent Labs  09/04/14 2211  NA 140  K 3.2*  CL 105  CO2 25  GLUCOSE 171*  BUN 10  CREATININE 0.89  CALCIUM 9.3   Liver Function Tests: No results for input(s): AST, ALT, ALKPHOS, BILITOT, PROT, ALBUMIN in the last 72 hours. No results for input(s): LIPASE, AMYLASE in the last 72 hours. No results for input(s): AMMONIA in the last 72 hours. CBC:  Recent Labs  09/04/14 2211  WBC 8.5  HGB 14.1  HCT 41.0  MCV 87.0  PLT 277   Cardiac Enzymes: No results for input(s): CKTOTAL, CKMB, CKMBINDEX, TROPONINI in the last 72 hours. BNP: No results for input(s): PROBNP in the last 72 hours. D-Dimer:  Recent Labs  09/04/14 2317  DDIMER 0.75*   CBG: No results for input(s): GLUCAP in the last 72 hours. Hemoglobin A1C: No results for input(s): HGBA1C in the last 72 hours. Fasting Lipid Panel: No results for input(s): CHOL, HDL, LDLCALC, TRIG, CHOLHDL, LDLDIRECT in the last 72 hours. Thyroid Function Tests: No results for input(s): TSH, T4TOTAL, FREET4, T3FREE, THYROIDAB in the last 72 hours. Anemia Panel: No results for input(s): VITAMINB12, FOLATE, FERRITIN, TIBC, IRON, RETICCTPCT in the last 72 hours. Coagulation: No results for input(s): LABPROT, INR in the last 72 hours. Urine Drug Screen: Drugs of Abuse  No results found for: LABOPIA, COCAINSCRNUR, LABBENZ, AMPHETMU, THCU, LABBARB  Alcohol Level: No results for input(s): ETH in the last 72 hours. Urinalysis: No results for input(s): COLORURINE, LABSPEC, PHURINE, GLUCOSEU, HGBUR, BILIRUBINUR, KETONESUR, PROTEINUR, UROBILINOGEN, NITRITE, LEUKOCYTESUR in the last 72 hours.  Invalid input(s): APPERANCEUR Misc. Labs:  Imaging results:  Ct Angio Chest Pe W/cm &/or Wo Cm  09/05/2014   CLINICAL DATA:  Acute onset of tachycardia. Elevated D-dimer. Initial encounter.  EXAM: CT ANGIOGRAPHY CHEST WITH CONTRAST  TECHNIQUE: Multidetector CT imaging of the chest was performed using the standard protocol during  bolus administration of intravenous contrast. Multiplanar CT image reconstructions and MIPs were obtained to evaluate the vascular anatomy.  CONTRAST:  158mL OMNIPAQUE IOHEXOL 350 MG/ML SOLN  COMPARISON:  None.  FINDINGS: There is no evidence of pulmonary embolus.  The lungs appear clear bilaterally. Small lymph nodes are seen along the right major fissure. There is no evidence of significant focal consolidation, pleural effusion or pneumothorax. No masses are identified; no abnormal focal contrast enhancement is seen.  Scattered coronary calcification is seen. The mediastinum is otherwise unremarkable. No mediastinal lymphadenopathy is seen. No pericardial effusion is identified. The great vessels are grossly unremarkable in appearance. No axillary lymphadenopathy is seen. The visualized portions of the thyroid gland are unremarkable in appearance.  The visualized portions of the liver and spleen are unremarkable. The visualized portions of the pancreas, stomach, adrenal glands and kidneys are within normal limits. The patient is status post cholecystectomy, with clips noted at the gallbladder fossa.  No acute osseous abnormalities are seen. Anterior bridging osteophytes are seen along the lower thoracic spine.  Review of the MIP images confirms the above findings.  IMPRESSION: 1. No evidence of pulmonary embolus. 2. Lungs clear bilaterally.   Electronically Signed   By: Garald Balding M.D.   On: 09/05/2014 01:27    Other results: EKG: none.   Assessment & Plan by Problem: Principal Problem:   Left shoulder pain Active Problems:   Diabetes mellitus type 2, uncomplicated   Essential hypertension   GERD   Osteoarthritis   CHF (congestive heart failure)  59 yo female with HTN, HLD, DM II, CHF  And osteoarthritis here with left shoulder pain.  Left shoulder pain - with radiation down the arm. Could be frozen shoulder 2/2 to osteoarthritis and not moving her arm due to pain 2/2 to that which has been  gradually worsening. It could be complicated by rotator cuff tear caused by when she tried to reach her clothes today vs bursitis  Unlikely to be sep tic arthritis (no fever or WBC, shoulder joint doesn't look inflammed or swollen). No injuries to suggest fracture.   - main management would be outpatient. She would need evaluation by sports medicine or orthopaedic. May benefit from steroid injections and physical therapy. Primary team should try to reach sports medicine clinic and get her an appointment urgently.  - shoulder xray, Cspine xray to rule out nerve involvement causing her symptoms. Wrist and hand xray since she has TTP on fingers. - warm compresses, voltaren gel to shoulder area, robaxin for muscle spasm - avoid NSAID for now as she received IV contrast.   Osteoarthritis - of hip, Lumbar spine. Likely has on other joints too - needs sports medicine referral - tylenol scheduled. Tylenol is recommended over NSAID for non-inflammatory OA given it has lower risk of side effect from long term use.   Tachycardia - sinus. Likely 2/2 to pain. Normal at rest. HR goes up with movement/exam likely 2/2 to pain. - no PE on ct angio. I will not further investigate it as this is most likelyfrom her pain.  - she doesn't appear volume depleted on exam,   Hx HTN now hypotension - will hold anti-hypertensives (elanapril, lasix). Will do metoprolol just to help with tachycardia.  Hypo K+ -  Likely 2/2 to lasix. Replete PRN  DM II - hgba1c 6.8 on 06/2014, was on insulin in the past - on glypizide and metformin at home - will do SSI here.  CHF - last ECHO 55-65% on 2007, but had been lower in 40's in the past. - cont asa. Hold acei, lasix for now. and  metoprolol for now in the setting of hypotension  hld - cont zocor   Dispo: Disposition is deferred at this time, awaiting improvement of current medical problems. Anticipated discharge in approximately 1 day(s).   The patient does have a current  PCP ([name], MD) and does need an [facility] hospital follow-up appointment after discharge.  The patient does have transportation limitations that hinder transportation to clinic appointments.  Signed: Dellia Nims, MD 09/05/2014, 3:49 AM

## 2014-09-05 NOTE — ED Notes (Signed)
Admitting MD at BS.  

## 2014-09-05 NOTE — Progress Notes (Signed)
Subjective: Patient feels better this morning. She has found that the heat packs are helping her shoulder pain.  Objective: Vital signs in last 24 hours: Filed Vitals:   09/04/14 2308 09/05/14 0205 09/05/14 0332 09/05/14 0428  BP: 94/57 88/60 101/62   Pulse: 110  103   Temp: 98.1 F (36.7 C)  98.2 F (36.8 C)   TempSrc: Oral  Oral   Resp:      Height: 5\' 2"  (1.575 m)   5\' 2"  (1.575 m)  Weight: 235 lb (106.595 kg)   212 lb 8.4 oz (96.4 kg)  SpO2: 98%  98%    Weight change:   Intake/Output Summary (Last 24 hours) at 09/05/14 1349 Last data filed at 09/05/14 1129  Gross per 24 hour  Intake    480 ml  Output    450 ml  Net     30 ml   Physical Exam: Appearance: in NAD, cooperative, calm but mistrusting of providers HEENT: AT/[place], PERRL, EOMi, no lymphadenopathy Heart: RRR, normal S1S2 Lungs: CTAB, no wheezes Abdomen: obese abdomen, BS+, soft, nontender Musculoskeletal: tenderness to any examiner's movement of left arm, some pain on her own movement. Unable to extend left arm past 90 degrees. Also has TTP on left 3rd and 4th digit. No redness or warmth. Good pulses on all exts. Strength 5/5 on wrist and elbow on left.  Extremities: no BLE Neurologic: A&Ox3, grossly intact Skin: no lesions or rashes  Lab Results: Basic Metabolic Panel:  Recent Labs Lab 09/04/14 2211 09/05/14 0548  NA 140 139  K 3.2* 4.0  CL 105 106  CO2 25 21  GLUCOSE 171* 174*  BUN 10 13  CREATININE 0.89 0.88  CALCIUM 9.3 9.4  MG  --  2.1   CBC:  Recent Labs Lab 09/04/14 2211  WBC 8.5  HGB 14.1  HCT 41.0  MCV 87.0  PLT 277   D-Dimer:  Recent Labs Lab 09/04/14 2317  DDIMER 0.75*   CBG:  Recent Labs Lab 09/05/14 0806 09/05/14 1204  GLUCAP 136* 169*   Studies/Results: Dg Cervical Spine Complete  09/05/2014   CLINICAL DATA:  Left-sided neck pain radiating to left shoulder and hand. Symptoms for months.  EXAM: CERVICAL SPINE  4+ VIEWS  COMPARISON:  None.  FINDINGS: The  cervicothoracic junction is partially obscured by osseous and soft tissue overlap on the lateral view. Cervical spine alignment is maintained. Vertebral body heights are preserved. There is disc space narrowing at C4-C5, C5-C6, and C6-C7 with associated endplate spurs. There is bony neural foraminal narrowing at C5-C6 bilaterally. The dens is intact. Posterior elements appear well-aligned. There is no evidence of fracture. No prevertebral soft tissue edema.  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 Bony neural foraminal narrowing at C5-C6. No acute bony abnormality.   Electronically Signed   By: Jeb Levering M.D.   On: 09/05/2014 04:47   Ct Angio Chest Pe W/cm &/or Wo Cm  09/05/2014   CLINICAL DATA:  Acute onset of tachycardia. Elevated D-dimer. Initial encounter.  EXAM: CT ANGIOGRAPHY CHEST WITH CONTRAST  TECHNIQUE: Multidetector CT imaging of the chest was performed using the standard protocol during bolus administration of intravenous contrast. Multiplanar CT image reconstructions and MIPs were obtained to evaluate the vascular anatomy.  CONTRAST:  18mL OMNIPAQUE IOHEXOL 350 MG/ML SOLN  COMPARISON:  None.  FINDINGS: There is no evidence of pulmonary embolus.  The lungs appear clear bilaterally. Small lymph nodes are seen along the right major fissure. There is  no evidence of significant focal consolidation, pleural effusion or pneumothorax. No masses are identified; no abnormal focal contrast enhancement is seen.  Scattered coronary calcification is seen. The mediastinum is otherwise unremarkable. No mediastinal lymphadenopathy is seen. No pericardial effusion is identified. The great vessels are grossly unremarkable in appearance. No axillary lymphadenopathy is seen. The visualized portions of the thyroid gland are unremarkable in appearance.  The visualized portions of the liver and spleen are unremarkable. The visualized portions of the pancreas, stomach, adrenal glands and  kidneys are within normal limits. The patient is status post cholecystectomy, with clips noted at the gallbladder fossa.  No acute osseous abnormalities are seen. Anterior bridging osteophytes are seen along the lower thoracic spine.  Review of the MIP images confirms the above findings.  IMPRESSION: 1. No evidence of pulmonary embolus. 2. Lungs clear bilaterally.   Electronically Signed   By: Garald Balding M.D.   On: 09/05/2014 01:27   Dg Shoulder Left  09/05/2014   CLINICAL DATA:  Left shoulder pain for months.  No known injury.  EXAM: LEFT SHOULDER - 2+ VIEW  COMPARISON:  None.  FINDINGS: No fracture or dislocation. The alignment and joint spaces are maintained. There is mild osteoarthritis at the acromioclavicular joint. There is inferior spurring from the glenoid. No definite abnormal soft tissue calcifications.  IMPRESSION: Acromioclavicular and glenohumeral osteoarthritis. No acute bony abnormality.   Electronically Signed   By: Jeb Levering M.D.   On: 09/05/2014 04:45   Dg Hand Complete Left  09/05/2014   CLINICAL DATA:  Left hand pain, no known injury.  Pain for months.  EXAM: LEFT HAND - COMPLETE 3+ VIEW  COMPARISON:  None.  FINDINGS: Evaluation of the digits is limited due to flexed positioning. No fracture or dislocation. Mild osteoarthritis of the thumb and index finger metacarpal phalangeal joints with joint space narrowing and osteophytes. The joint spaces are otherwise maintained. There are no erosions or periosteal reaction. No focal soft tissue abnormality.  IMPRESSION: Mild osteoarthritis. No evidence of inflammatory arthropathy or acute bony abnormality.   Electronically Signed   By: Jeb Levering M.D.   On: 09/05/2014 04:49   Medications: I have reviewed the patient's current medications. Scheduled Meds: . acetaminophen  650 mg Oral Q6H  . aspirin EC  81 mg Oral Daily  . diclofenac sodium  2 g Topical QID  . heparin  5,000 Units Subcutaneous 3 times per day  . insulin aspart   0-9 Units Subcutaneous TID WC  . insulin glargine  30 Units Subcutaneous BID  . methocarbamol  750 mg Oral TID  . metoprolol succinate  50 mg Oral Daily  . pantoprazole  40 mg Oral Daily  . simvastatin  40 mg Oral Daily   Continuous Infusions:  PRN Meds:.acetaminophen **OR** acetaminophen Assessment/Plan: Principal Problem:   Left shoulder pain Active Problems:   Diabetes mellitus type 2, uncomplicated   Essential hypertension   GERD   Osteoarthritis   CHF (congestive heart failure)   Hypokalemia  59 yo female with HTN, HLD, DM II, CHFand osteoarthritis here with left shoulder pain.  Left Shoulder Pain: Patient unable to raise arm above 90 degrees due to pain. Has some pain radiation down the arm. Could be frozen shoulder 2/2 to osteoarthritis and not moving her arm due to pain 2/2 to that which has been gradually worsening. It could be complicated by rotator cuff tear caused by when she tried to reach her clothes today vs bursitis. No reported trauma. Unlikely to  be septic arthritis (no fever or WBC, shoulder joint doesn't look inflammed or swollen). No injuries to suggest fracture. Left shoulder XR showed acromioclavicular and glenohumeral osteoarthritis with no acute bony abnormality. XR c-spine shows degenerative disc disease from C4-C5 through C6-C7. Bony neural foraminal narrowing at C5-C6. No acute bony abnormality. - Recommending sports medicine / orthopaedic follow up as outpatient; may benefit from steroid injections and physical therapy - PT recommends home PT - Warm packs - Continue voltaren gel to shoulder area - Continue robaxin for muscle spasm  Osteoarthritis: Also in hip, Lumbar spine.  - Sports medicine / orthopaedics referral - Continue tylenol scheduled (non-inflammatory OA)   History of HTN, now Hypotensive: 96/64 currently. - Continue to hold home elanapril, lasix - Continue metoprolol  Hypokalemia: Repleted, now 3.2-->4.0. Likely 2/2 to lasix, which  patient has been taking since 1994.  - Suggest stopping in discharge summary  DM II: HgbA1c 6.8% on 06/2014, was on insulin in the past - On glypizide and metformin at home - SSI   CHF: Last Echo 55-65% on 2007, but had been lower in 40's in the past. - Cont ASA - Hold acei, lasix for now - Continue metoprolol   Hyperlipidemia:  - Continue zocor  Dispo: Disposition is deferred at this time, awaiting improvement of current medical problems.  Anticipated discharge in approximately 0 day(s).   The patient does have a current PCP ([name], MD) and does need an [facility] hospital follow-up appointment after discharge.  The patient does not have transportation limitations that hinder transportation to clinic appointments.  .Services Needed at time of discharge: Y = Yes, Blank = No PT:   OT:   RN:   Equipment:   Other:     LOS: 1 day   Drucilla Schmidt, MD 09/05/2014, 1:49 PM

## 2014-09-05 NOTE — Progress Notes (Signed)
Attempted to get report. RN told that she will receive call back.

## 2014-09-05 NOTE — Progress Notes (Signed)
Report received- patient due to xray before arrival.

## 2014-09-05 NOTE — Progress Notes (Signed)
Nutrition Brief Note  Patient identified on the Malnutrition Screening Tool (MST) Report  Wt Readings from Last 15 Encounters:  09/05/14 212 lb 8.4 oz (96.4 kg)  06/20/14 235 lb 3.2 oz (106.686 kg)  01/03/14 262 lb 1.6 oz (118.888 kg)  06/07/13 266 lb 14.4 oz (121.065 kg)  01/25/13 268 lb 3.2 oz (121.655 kg)  12/21/12 267 lb 1.6 oz (121.156 kg)  09/28/12 270 lb 14.4 oz (122.879 kg)  06/22/12 274 lb 1.6 oz (124.331 kg)  03/23/12 271 lb (122.925 kg)  12/16/11 273 lb 14.4 oz (124.24 kg)  08/19/11 274 lb 6.4 oz (124.467 kg)  03/12/11 282 lb 8 oz (128.141 kg)  09/04/10 283 lb 4.8 oz (128.504 kg)  03/12/10 278 lb 14.4 oz (126.508 kg)  10/11/09 287 lb 4.8 oz (130.318 kg)    Body mass index is 38.86 kg/(m^2). Patient meets criteria for Obesity II based on current BMI.   Current diet order is CHO Modified, patient is consuming approximately 100% of meals at this time. Pt reports no loss in appetite recently.  Pt does not know what weight loss but is possibly due to diuretics or poorly controlled diabetes. Labs and medications reviewed.   No nutrition interventions warranted at this time. If nutrition issues arise, please consult RD.   Elmer Picker MS Dietetic Intern Pager Number [phone]

## 2014-09-05 NOTE — Progress Notes (Signed)
UR completed 

## 2014-09-05 NOTE — Care Management Note (Signed)
    Page 1 of 1   09/05/2014     5:56:41 PM CARE MANAGEMENT NOTE 09/05/2014  Patient:  Julie Elliott, Julie Elliott   Account Number:  [id number]  Date Initiated:  09/05/2014  Documentation initiated by:  Tomi Bamberger  Subjective/Objective Assessment:   admit from home     Action/Plan:   pt eval- rec hhpt/ot   Anticipated DC Date:  09/05/2014   Anticipated DC Plan:  [place]  CM consult      [facility] Choice  HOME HEALTH   Choice offered to / List presented to:  C-1 Patient        [place] arranged  [place]   Status of service:  Completed, signed off Medicare Important Message given?  NA - LOS <3 / Initial given by admissions (If response is "NO", the following Medicare IM given date fields will be blank) Date Medicare IM given:   Medicare IM given by:   Date Additional Medicare IM given:   Additional Medicare IM given by:    Discharge Disposition:  [place]  Per UR Regulation:  Reviewed for med. necessity/level of care/duration of stay  If discussed at [place] of Stay Meetings, dates discussed:    Comments:  09/05/14 [place] [phone] patient is for dc today,she chose [facility] for hhpt/ot, referral made to Sherrin Daisy notified.  Soc will begin 24- 48 hrs post dc.

## 2014-09-05 NOTE — Discharge Instructions (Signed)
Please follow up with Julie Elliott at 11:30 on 09/08/14.  You may need an injection or other intervention to help with your pain.  In the meantime, please use warm packs, voltaren gel and robaxin to help your pain. You can also take tylenol 650 mg every 8 hours as needed for pain.  Please work hard with the physical and occupational therapists at your home as well.  Acromioclavicular Injuries The AC (acromioclavicular) joint is the joint in the shoulder where the collarbone (clavicle) meets the shoulder blade (scapula). The part of the shoulder blade connected to the collarbone is called the acromion. Common problems with and treatments for the [facility] joint are detailed below. ARTHRITIS Arthritis occurs when the joint has been injured and the smooth padding between the joints (cartilage) is lost. This is the wear and tear seen in most joints of the body if they have been overused. This causes the joint to produce pain and swelling which is worse with activity.  AC JOINT SEPARATION AC joint separation means that the ligaments connecting the acromion of the shoulder blade and collarbone have been damaged, and the two bones no longer line up. AC separations can be anywhere from mild to severe, and are "graded" depending upon which ligaments are torn and how badly they are torn.  Grade I Injury: the least damage is done, and the [facility] joint still lines up.  Grade II Injury: damage to the ligaments which reinforce the [facility] joint. In a Grade II injury, these ligaments are stretched but not entirely torn. When stressed, the [facility] joint becomes painful and unstable.  Grade III Injury: AC and secondary ligaments are completely torn, and the collarbone is no longer attached to the shoulder blade. This results in deformity; a prominence of the end of the clavicle. AC JOINT FRACTURE AC joint fracture means that there has been a break in the bones of the [facility] joint, usually the end of the  clavicle. TREATMENT TREATMENT OF AC ARTHRITIS  There is currently no way to replace the cartilage damaged by arthritis. The best way to improve the condition is to decrease the activities which aggravate the problem. Application of ice to the joint helps decrease pain and soreness (inflammation). The use of non-steroidal anti-inflammatory medication is helpful.  If less conservative measures do not work, then cortisone shots (injections) may be used. These are anti-inflammatories; they decrease the soreness in the joint and swelling.  If non-surgical measures fail, surgery may be recommended. The procedure is generally removal of a portion of the end of the clavicle. This is the part of the collarbone closest to your acromion which is stabilized with ligaments to the acromion of the shoulder blade. This surgery may be performed using a tube-like instrument with a light (arthroscope) for looking into a joint. It may also be performed as an open surgery through a small incision by the surgeon. Most patients will have good range of motion within 6 weeks and may return to all activity including sports by 8-12 weeks, barring complications. TREATMENT OF AN AC SEPARATION  The initial treatment is to decrease pain. This is best accomplished by immobilizing the arm in a sling and placing an ice pack to the shoulder for 20 to 30 minutes every 2 hours as needed. As the pain starts to subside, it is important to begin moving the fingers, wrist, elbow and eventually the shoulder in order to prevent a stiff or "frozen" shoulder. Instruction on when and how much to move  the shoulder will be provided by your caregiver. The length of time needed to regain full motion and function depends on the amount or grade of the injury. Recovery from a Grade I AC separation usually takes 10 to 14 days, whereas a Grade III may take 6 to 8 weeks.  Grade I and II separations usually do not require surgery. Even Grade III injuries  usually allow return to full activity with few restrictions. Treatment is also based on the activity demands of the injured shoulder. For example, a high level quarterback with an injured throwing arm will receive more aggressive treatment than someone with a desk job who rarely uses his/her arm for strenuous activities. In some cases, a painful lump may persist which could require a later surgery. Surgery can be very successful, but the benefits must be weighed against the potential risks. TREATMENT OF AN AC JOINT FRACTURE Fracture treatment depends on the type of fracture. Sometimes a splint or sling may be all that is required. Other times surgery may be required for repair. This is more frequently the case when the ligaments supporting the clavicle are completely torn. Your caregiver will help you with these decisions and together you can decide what will be the best treatment. HOME CARE INSTRUCTIONS   Apply ice to the injury for 15-20 minutes each hour while awake for 2 days. Put the ice in a plastic bag and place a towel between the bag of ice and skin.  If a sling has been applied, wear it constantly for as long as directed by your caregiver, even at night. The sling or splint can be removed for bathing or showering or as directed. Be sure to keep the shoulder in the same place as when the sling is on. Do not lift the arm.  If a figure-of-eight splint has been applied it should be tightened gently by another person every day. Tighten it enough to keep the shoulders held back. Allow enough room to place the index finger between the body and strap. Loosen the splint immediately if there is numbness or tingling in the hands.  Take over-the-counter or prescription medicines for pain, discomfort or fever as directed by your caregiver.  If you or your child has received a follow up appointment, it is very important to keep that appointment in order to avoid long term complications, chronic pain or  disability. SEEK MEDICAL CARE IF:   The pain is not relieved with medications.  There is increased swelling or discoloration that continues to get worse rather than better.  You or your child has been unable to follow up as instructed.  There is progressive numbness and tingling in the arm, forearm or hand. SEEK IMMEDIATE MEDICAL CARE IF:   The arm is numb, cold or pale.  There is increasing pain in the hand, forearm or fingers. MAKE SURE YOU:   Understand these instructions.  Will watch your condition.  Will get help right away if you are not doing well or get worse. Document Released: 04/30/2005 Document Revised: 10/13/2011 Document Reviewed: 10/23/2008 [facility] Patient Information 2015 [place], [place]. This information is not intended to replace advice given to you by your health care provider. Make sure you discuss any questions you have with your health care provider.

## 2014-09-05 NOTE — Progress Notes (Signed)
Julie Elliott to be D/C'd Home per MD order.  Discussed with the patient and all questions fully answered.    Medication List    TAKE these medications        acetaminophen 650 MG CR tablet  Commonly known as:  TYLENOL  Take 1 tablet (650 mg total) by mouth every 8 (eight) hours as needed for pain.     aspirin 81 MG EC tablet  Take 1 tablet (81 mg total) by mouth daily.     B-D ULTRA-FINE 33 LANCETS Misc  Use for checking blood sugar, 3 times per day. Diagnosis Code is 250.00     Calcium-Vitamin D 600-125 MG-UNIT Tabs  Take 1 tablet by mouth daily.     diclofenac sodium 1 % Gel  Commonly known as:  VOLTAREN  Apply 2 g topically 4 (four) times daily.     enalapril 20 MG tablet  Commonly known as:  VASOTEC  Take 1 tablet (20 mg total) by mouth 2 (two) times daily.     furosemide 80 MG tablet  Commonly known as:  LASIX  Take 1 tablet (80 mg total) by mouth daily.     gabapentin 300 MG capsule  Commonly known as:  NEURONTIN  - Day 1: 300 mg (1tablet) once daily  - Day 2: 600 mg (2 tablets) once daily  - Days 3-6: 900 (3 tablets) mg once daily  - Days 7 on : 1,200 (4 tablets) mg once daily     glipiZIDE 10 MG tablet  Commonly known as:  GLUCOTROL  Take 1 tablet (10 mg total) by mouth 2 (two) times daily before a meal.     glucose blood test strip  Commonly known as:  BAYER CONTOUR TEST  Asc[employer]ia. Use to test blood sugar 3 times a day.     Insulin Glargine 100 UNIT/ML Solostar Pen  Commonly known as:  LANTUS SOLOSTAR  Inject 30 Units into the skin 2 (two) times daily.     Insulin Pen Needle 32G X 4 MM Misc  Use for insulin injection, bid. Diagnosis code is 250.00 for DM-II.     metFORMIN 500 MG tablet  Commonly known as:  GLUCOPHAGE  Take 1 tablet (500 mg total) by mouth daily with breakfast.     methocarbamol 750 MG tablet  Commonly known as:  ROBAXIN  Take 1 tablet (750 mg total) by mouth 3 (three) times daily.     metoprolol succinate 50 MG 24 hr tablet   Commonly known as:  TOPROL-XL  Take 1 tablet (50 mg total) by mouth daily.     naproxen 500 MG tablet  Commonly known as:  NAPROSYN  Take 1 tablet (500 mg total) by mouth 2 (two) times daily.     omeprazole 20 MG capsule  Commonly known as:  PRILOSEC  Take 1 capsule (20 mg total) by mouth 2 (two) times daily.     potassium chloride SA 20 MEQ tablet  Commonly known as:  K-DUR,KLOR-CON  Take 1 tablet (20 mEq total) by mouth daily.     simvastatin 40 MG tablet  Commonly known as:  ZOCOR  Take 1 tablet (40 mg total) by mouth daily.     traMADol 50 MG tablet  Commonly known as:  ULTRAM  Take 1 tablet (50 mg total) by mouth every 6 (six) hours as needed.        .  An After Visit Summary was printed and given to the patient/family.  D/c education completed  with patient/family including follow up instructions, medication list, d/c activities limitations if indicated, with other d/c instructions as indicated by MD - patient able to verbalize understanding, all questions fully answered.   Patient instructed to return to ED, call 911, or call MD for any changes in condition.   Patient escorted via [place], and D/C home via private auto.  Audria Nine F 09/05/2014 1608

## 2014-09-05 NOTE — Discharge Summary (Signed)
Name: Julie Elliott MRN: [id number] DOB: September 09, 1955 59 y.o. PCP: [name], MD  Date of Admission: 09/04/2014 10:50 PM Date of Discharge: 09/05/2014 Attending Physician: Thayer Headings, MD  Discharge Diagnosis: Principal Problem:   Left shoulder pain Active Problems:   Diabetes mellitus type 2, uncomplicated   Essential hypertension   GERD   Osteoarthritis   CHF (congestive heart failure)   Hypokalemia  Discharge Medications:   Medication List    TAKE these medications        acetaminophen 650 MG CR tablet  Commonly known as:  TYLENOL  Take 1 tablet (650 mg total) by mouth every 8 (eight) hours as needed for pain.     aspirin 81 MG EC tablet  Take 1 tablet (81 mg total) by mouth daily.     B-D ULTRA-FINE 33 LANCETS Misc  Use for checking blood sugar, 3 times per day. Diagnosis Code is 250.00     Calcium-Vitamin D 600-125 MG-UNIT Tabs  Take 1 tablet by mouth daily.     diclofenac sodium 1 % Gel  Commonly known as:  VOLTAREN  Apply 2 g topically 4 (four) times daily.     enalapril 20 MG tablet  Commonly known as:  VASOTEC  Take 1 tablet (20 mg total) by mouth 2 (two) times daily.     furosemide 80 MG tablet  Commonly known as:  LASIX  Take 1 tablet (80 mg total) by mouth daily.     gabapentin 300 MG capsule  Commonly known as:  NEURONTIN  - Day 1: 300 mg (1tablet) once daily  - Day 2: 600 mg (2 tablets) once daily  - Days 3-6: 900 (3 tablets) mg once daily  - Days 7 on : 1,200 (4 tablets) mg once daily     glipiZIDE 10 MG tablet  Commonly known as:  GLUCOTROL  Take 1 tablet (10 mg total) by mouth 2 (two) times daily before a meal.     glucose blood test strip  Commonly known as:  BAYER CONTOUR TEST  [employer]. Use to test blood sugar 3 times a day.     Insulin Glargine 100 UNIT/ML Solostar Pen  Commonly known as:  LANTUS SOLOSTAR  Inject 30 Units into the skin 2 (two) times daily.     Insulin Pen Needle 32G X 4 MM Misc  Use for insulin injection,  bid. Diagnosis code is 250.00 for DM-II.     metFORMIN 500 MG tablet  Commonly known as:  GLUCOPHAGE  Take 1 tablet (500 mg total) by mouth daily with breakfast.     methocarbamol 750 MG tablet  Commonly known as:  ROBAXIN  Take 1 tablet (750 mg total) by mouth 3 (three) times daily.     metoprolol succinate 50 MG 24 hr tablet  Commonly known as:  TOPROL-XL  Take 1 tablet (50 mg total) by mouth daily.     naproxen 500 MG tablet  Commonly known as:  NAPROSYN  Take 1 tablet (500 mg total) by mouth 2 (two) times daily.     omeprazole 20 MG capsule  Commonly known as:  PRILOSEC  Take 1 capsule (20 mg total) by mouth 2 (two) times daily.     potassium chloride SA 20 MEQ tablet  Commonly known as:  K-DUR,KLOR-CON  Take 1 tablet (20 mEq total) by mouth daily.     simvastatin 40 MG tablet  Commonly known as:  ZOCOR  Take 1 tablet (40 mg total) by mouth daily.  traMADol 50 MG tablet  Commonly known as:  ULTRAM  Take 1 tablet (50 mg total) by mouth every 6 (six) hours as needed.        Disposition and follow-up:   [name] was discharged from [facility] in Stable condition.  At the hospital follow up visit please address:  1.  Shoulder Pain: Patient has follow up with [place] on 09/08/14 for assessment of her shoulder pain.   Hypotension in Hospital: Please recheck BP and consider manipulating BP medications (currently on metoprolol, enalapril and lasix for history of hypertension)  Hypokalemia: Consider stopping lasix (80 mg daily); potassium was repleated in the hospital.  2.  Labs / imaging needed at time of follow-up: BMET for K  3.  Pending labs/ test needing follow-up: none  Follow-up Appointments:     Follow-up Information    Follow up with [facility].   Why:  hhpt/ot   Contact information:   [address] [place] [address] [phone]       Follow up with Jerene Pitch, MD On 09/12/2014.    Specialty:  Internal Medicine   Why:  9:45 AM   Contact information:   [place]  [id number] [phone]       Follow up with [facility] On 09/08/2014.   Why:  Arrive by 11:30 AM (appointment at 12:00PM)   Contact information:   [address] [place] [phone]      Discharge Instructions:  Please follow up with [facility] at 11:30 on 09/08/14.  You may need an injection or other intervention to help with your pain.  In the meantime, please use warm packs, voltaren gel and robaxin to help your pain. You can also take tylenol 650 mg every 8 hours as needed for pain.   Please work hard with the physical and occupational therapists at your home as well.  Acromioclavicular Injuries The AC (acromioclavicular) joint is the joint in the shoulder where the collarbone (clavicle) meets the shoulder blade (scapula). The part of the shoulder blade connected to the collarbone is called the acromion. Common problems with and treatments for the [facility] joint are detailed below. ARTHRITIS Arthritis occurs when the joint has been injured and the smooth padding between the joints (cartilage) is lost. This is the wear and tear seen in most joints of the body if they have been overused. This causes the joint to produce pain and swelling which is worse with activity.  AC JOINT SEPARATION AC joint separation means that the ligaments connecting the acromion of the shoulder blade and collarbone have been damaged, and the two bones no longer line up. AC separations can be anywhere from mild to severe, and are "graded" depending upon which ligaments are torn and how badly they are torn.  Grade I Injury: the least damage is done, and the [facility] joint still lines up.  Grade II Injury: damage to the ligaments which reinforce the [facility] joint. In a Grade II injury, these ligaments are stretched but not entirely torn. When stressed, the [facility]  joint becomes painful and unstable.  Grade III Injury: AC and secondary ligaments are completely torn, and the collarbone is no longer attached to the shoulder blade. This results in deformity; a prominence of the end of the clavicle. AC JOINT FRACTURE AC joint fracture means that there has been a break in the bones of the [facility] joint, usually the  end of the clavicle. TREATMENT TREATMENT OF AC ARTHRITIS  There is currently no way to replace the cartilage damaged by arthritis. The best way to improve the condition is to decrease the activities which aggravate the problem. Application of ice to the joint helps decrease pain and soreness (inflammation). The use of non-steroidal anti-inflammatory medication is helpful.  If less conservative measures do not work, then cortisone shots (injections) may be used. These are anti-inflammatories; they decrease the soreness in the joint and swelling.  If non-surgical measures fail, surgery may be recommended. The procedure is generally removal of a portion of the end of the clavicle. This is the part of the collarbone closest to your acromion which is stabilized with ligaments to the acromion of the shoulder blade. This surgery may be performed using a tube-like instrument with a light (arthroscope) for looking into a joint. It may also be performed as an open surgery through a small incision by the surgeon. Most patients will have good range of motion within 6 weeks and may return to all activity including sports by 8-12 weeks, barring complications. TREATMENT OF AN AC SEPARATION  The initial treatment is to decrease pain. This is best accomplished by immobilizing the arm in a sling and placing an ice pack to the shoulder for 20 to 30 minutes every 2 hours as needed. As the pain starts to subside, it is important to begin moving the fingers, wrist, elbow and eventually the shoulder in order to prevent a stiff or "frozen" shoulder. Instruction on when and how much to  move the shoulder will be provided by your caregiver. The length of time needed to regain full motion and function depends on the amount or grade of the injury. Recovery from a Grade I AC separation usually takes 10 to 14 days, whereas a Grade III may take 6 to 8 weeks.  Grade I and II separations usually do not require surgery. Even Grade III injuries usually allow return to full activity with few restrictions. Treatment is also based on the activity demands of the injured shoulder. For example, a high level quarterback with an injured throwing arm will receive more aggressive treatment than someone with a desk job who rarely uses his/her arm for strenuous activities. In some cases, a painful lump may persist which could require a later surgery. Surgery can be very successful, but the benefits must be weighed against the potential risks. TREATMENT OF AN AC JOINT FRACTURE Fracture treatment depends on the type of fracture. Sometimes a splint or sling may be all that is required. Other times surgery may be required for repair. This is more frequently the case when the ligaments supporting the clavicle are completely torn. Your caregiver will help you with these decisions and together you can decide what will be the best treatment. HOME CARE INSTRUCTIONS   Apply ice to the injury for 15-20 minutes each hour while awake for 2 days. Put the ice in a plastic bag and place a towel between the bag of ice and skin.  If a sling has been applied, wear it constantly for as long as directed by your caregiver, even at night. The sling or splint can be removed for bathing or showering or as directed. Be sure to keep the shoulder in the same place as when the sling is on. Do not lift the arm.  If a figure-of-eight splint has been applied it should be tightened gently by another person every day. Tighten it enough to keep the  shoulders held back. Allow enough room to place the index finger between the body and strap.  Loosen the splint immediately if there is numbness or tingling in the hands.  Take over-the-counter or prescription medicines for pain, discomfort or fever as directed by your caregiver.  If you or your child has received a follow up appointment, it is very important to keep that appointment in order to avoid long term complications, chronic pain or disability. SEEK MEDICAL CARE IF:   The pain is not relieved with medications.  There is increased swelling or discoloration that continues to get worse rather than better.  You or your child has been unable to follow up as instructed.  There is progressive numbness and tingling in the arm, forearm or hand. SEEK IMMEDIATE MEDICAL CARE IF:   The arm is numb, cold or pale.  There is increasing pain in the hand, forearm or fingers. MAKE SURE YOU:   Understand these instructions.  Will watch your condition.  Will get help right away if you are not doing well or get worse. Document Released: 04/30/2005 Document Revised: 10/13/2011 Document Reviewed: 10/23/2008 [facility] Patient Information 2015 [place], [place]. This information is not intended to replace advice given to you by your health care provider. Make sure you discuss any questions you have with your health care provider.  Consultations:    Procedures Performed:  Dg Cervical Spine Complete  09/05/2014   CLINICAL DATA:  Left-sided neck pain radiating to left shoulder and hand. Symptoms for months.  EXAM: CERVICAL SPINE  4+ VIEWS  COMPARISON:  None.  FINDINGS: The cervicothoracic junction is partially obscured by osseous and soft tissue overlap on the lateral view. Cervical spine alignment is maintained. Vertebral body heights are preserved. There is disc space narrowing at C4-C5, C5-C6, and C6-C7 with associated endplate spurs. There is bony neural foraminal narrowing at C5-C6 bilaterally. The dens is intact. Posterior elements appear well-aligned. There is no evidence of fracture. No  prevertebral soft tissue edema.  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 Bony neural foraminal narrowing at C5-C6. No acute bony abnormality.   Electronically Signed   By: Jeb Levering M.D.   On: 09/05/2014 04:47   Ct Angio Chest Pe W/cm &/or Wo Cm  09/05/2014   CLINICAL DATA:  Acute onset of tachycardia. Elevated D-dimer. Initial encounter.  EXAM: CT ANGIOGRAPHY CHEST WITH CONTRAST  TECHNIQUE: Multidetector CT imaging of the chest was performed using the standard protocol during bolus administration of intravenous contrast. Multiplanar CT image reconstructions and MIPs were obtained to evaluate the vascular anatomy.  CONTRAST:  111mL OMNIPAQUE IOHEXOL 350 MG/ML SOLN  COMPARISON:  None.  FINDINGS: There is no evidence of pulmonary embolus.  The lungs appear clear bilaterally. Small lymph nodes are seen along the right major fissure. There is no evidence of significant focal consolidation, pleural effusion or pneumothorax. No masses are identified; no abnormal focal contrast enhancement is seen.  Scattered coronary calcification is seen. The mediastinum is otherwise unremarkable. No mediastinal lymphadenopathy is seen. No pericardial effusion is identified. The great vessels are grossly unremarkable in appearance. No axillary lymphadenopathy is seen. The visualized portions of the thyroid gland are unremarkable in appearance.  The visualized portions of the liver and spleen are unremarkable. The visualized portions of the pancreas, stomach, adrenal glands and kidneys are within normal limits. The patient is status post cholecystectomy, with clips noted at the gallbladder fossa.  No acute osseous abnormalities are seen. Anterior bridging osteophytes are seen along the  lower thoracic spine.  Review of the MIP images confirms the above findings.  IMPRESSION: 1. No evidence of pulmonary embolus. 2. Lungs clear bilaterally.   Electronically Signed   By: Garald Balding M.D.   On: 09/05/2014  01:27   Dg Shoulder Left  09/05/2014   CLINICAL DATA:  Left shoulder pain for months.  No known injury.  EXAM: LEFT SHOULDER - 2+ VIEW  COMPARISON:  None.  FINDINGS: No fracture or dislocation. The alignment and joint spaces are maintained. There is mild osteoarthritis at the acromioclavicular joint. There is inferior spurring from the glenoid. No definite abnormal soft tissue calcifications.  IMPRESSION: Acromioclavicular and glenohumeral osteoarthritis. No acute bony abnormality.   Electronically Signed   By: Jeb Levering M.D.   On: 09/05/2014 04:45   Dg Hand Complete Left  09/05/2014   CLINICAL DATA:  Left hand pain, no known injury.  Pain for months.  EXAM: LEFT HAND - COMPLETE 3+ VIEW  COMPARISON:  None.  FINDINGS: Evaluation of the digits is limited due to flexed positioning. No fracture or dislocation. Mild osteoarthritis of the thumb and index finger metacarpal phalangeal joints with joint space narrowing and osteophytes. The joint spaces are otherwise maintained. There are no erosions or periosteal reaction. No focal soft tissue abnormality.  IMPRESSION: Mild osteoarthritis. No evidence of inflammatory arthropathy or acute bony abnormality.   Electronically Signed   By: Jeb Levering M.D.   On: 09/05/2014 04:49   Admission HPI: 59 yo female with HTN, HLD, DM II, CHF presents with left arm pain beginning at her left shoulder and radiating down to her elbow. This has been going on for 2 months but gradually worsening. It worsened suddenly even more today. This started when she was reaching for her clothes in the shower tonight and felt her arm "lock up" around 9:30 pm. Having limited ROM due to pain. She is describing the pain as 10/10, feels like muscle spasm. She is not able to move her left arm above her shoulder area. Patient has been trying to get appt at internal medicine clinic for this problem but was not able to get an appt for some reason. She is frustrated about this.   She also has  hx of leg pain, knee pain, and hip pain which has been attributed to diabetic neuropathy and arthritis in the past. Has unbalance. CXR hip showed OA of both hips and mild joint space narrowing and osteophytes. SI joint was normal. Lumbar spine, degeneration disc And facet disease onf L3-L4, L5-S1.Marland Kitchen   Denies any weakness, numbness, tingling, denies chest pain. Was found to be hypotensive in the ED lowest 88/60 and hypoK 3.2 (repleted). CT angio was done because of tachycardia and elevated D-dimer. Negative for PE.   Hospital Course by problem list: Principal Problem:   Left shoulder pain Active Problems:   Diabetes mellitus type 2, uncomplicated   Essential hypertension   GERD   Osteoarthritis   CHF (congestive heart failure)   Hypokalemia   59 yo female with HTN, HLD, DM II, CHFand osteoarthritis here with left shoulder pain.  Left Shoulder Pain: Patient was unable to raise arm above 90 degrees due to pain. Has some pain radiation down the arm. Could be frozen shoulder 2/2 to osteoarthritis and not moving her arm due to pain 2/2 to that which has been gradually worsening. It could be complicated by rotator cuff tear caused by when she tried to reach her clothes today vs bursitis. No reported trauma. Unlikely  to be septic arthritis (no fever or WBC, shoulder joint doesn't look inflammed or swollen). No injuries to suggest fracture. Left shoulder XR showed acromioclavicular and glenohumeral osteoarthritis with no acute bony abnormality. XR c-spine shows degenerative disc disease from C4-C5 through C6-C7. Bony neural foraminal narrowing at C5-C6. No acute bony abnormality. Set up for orthopaedic follow up as outpatient; may benefit from steroid injections and physical therapy. Treating with warm packs, voltaren gel and robaxin.   Osteoarthritis: Also in hip, lumbar spine. Takes tylenol.  History of HTN: Hypotensive at times during this admission (96/64); asmptomatic. On home metoprolol,  enalapril and lasix. Enalapril and lasix were held during admission.   Hypokalemia: Repleted, 3.2-->4.0. Likely 2/2 to lasix, which patient has been taking since 1994.   Discharge Vitals:   BP 96/64 mmHg  Pulse 98  Temp(Src) 98.4 F (36.9 C) (Oral)  Resp 18  Ht 5\' 2"  (1.575 m)  Wt 212 lb 8.4 oz (96.4 kg)  BMI 38.86 kg/m2  SpO2 98%  Discharge Labs:  Results for orders placed or performed during the hospital encounter of 09/04/14 (from the past 24 hour(s))  CBC     Status: None   Collection Time: 09/04/14 10:11 PM  Result Value Ref Range   WBC 8.5 4.0 - 10.5 K/uL   RBC 4.71 3.87 - 5.11 MIL/uL   Hemoglobin 14.1 12.0 - 15.0 g/dL   HCT 41.0 36.0 - 46.0 %   MCV 87.0 78.0 - 100.0 fL   MCH 29.9 26.0 - 34.0 pg   MCHC 34.4 30.0 - 36.0 g/dL   RDW 13.0 11.5 - 15.5 %   Platelets 277 150 - 400 K/uL  Basic metabolic panel     Status: Abnormal   Collection Time: 09/04/14 10:11 PM  Result Value Ref Range   Sodium 140 135 - 145 mmol/L   Potassium 3.2 (L) 3.5 - 5.1 mmol/L   Chloride 105 96 - 112 mmol/L   CO2 25 19 - 32 mmol/L   Glucose, Bld 171 (H) 70 - 99 mg/dL   BUN 10 6 - 23 mg/dL   Creatinine, Ser 0.89 0.50 - 1.10 mg/dL   Calcium 9.3 8.4 - 10.5 mg/dL   GFR calc non Af Amer 71 (L) >90 mL/min   GFR calc Af Amer 82 (L) >90 mL/min   Anion gap 10 5 - 15  I-stat troponin, ED (not at [facility])     Status: None   Collection Time: 09/04/14 10:23 PM  Result Value Ref Range   Troponin i, poc 0.00 0.00 - 0.08 ng/mL   Comment 3          D-dimer, quantitative     Status: Abnormal   Collection Time: 09/04/14 11:17 PM  Result Value Ref Range   D-Dimer, Quant 0.75 (H) 0.00 - 0.48 ug/mL-FEU  Magnesium     Status: None   Collection Time: 09/05/14  5:48 AM  Result Value Ref Range   Magnesium 2.1 1.5 - 2.5 mg/dL  Basic metabolic panel     Status: Abnormal   Collection Time: 09/05/14  5:48 AM  Result Value Ref Range   Sodium 139 135 - 145 mmol/L   Potassium 4.0 3.5 - 5.1 mmol/L   Chloride 106 96  - 112 mmol/L   CO2 21 19 - 32 mmol/L   Glucose, Bld 174 (H) 70 - 99 mg/dL   BUN 13 6 - 23 mg/dL   Creatinine, Ser 0.88 0.50 - 1.10 mg/dL   Calcium 9.4 8.4 -  10.5 mg/dL   GFR calc non Af Amer 72 (L) >90 mL/min   GFR calc Af Amer 84 (L) >90 mL/min   Anion gap 12 5 - 15  Glucose, capillary     Status: Abnormal   Collection Time: 09/05/14  8:06 AM  Result Value Ref Range   Glucose-Capillary 136 (H) 70 - 99 mg/dL  Glucose, capillary     Status: Abnormal   Collection Time: 09/05/14 12:04 PM  Result Value Ref Range   Glucose-Capillary 169 (H) 70 - 99 mg/dL    Signed: Drucilla Schmidt, MD 09/05/2014, 3:22 PM    Services Ordered on Discharge: [place] [facility] Equipment Ordered on Discharge: none

## 2014-09-07 NOTE — Progress Notes (Signed)
Late entry for missed G-code. Based on review of the evaluation and goals by Rande Lawman, PT.    09/08/14 1332  PT G-Codes **NOT FOR INPATIENT CLASS**  Functional Assessment Tool Used Clinical judgement based on review of medical record  Functional Limitation Mobility: Walking and moving around  Mobility: Walking and Moving Around Current Status ([id number]) CI  Mobility: Walking and Moving Around Goal Status [phone]) CI  Mobility: Walking and Moving Around Discharge Status [phone]) CI  Lavonia Dana, [place]  [phone] 09/07/2014

## 2014-09-08 DIAGNOSIS — M7502 Adhesive capsulitis of left shoulder: Secondary | ICD-10-CM | POA: Diagnosis not present

## 2014-09-12 ENCOUNTER — Encounter: Payer: Self-pay | Admitting: Internal Medicine

## 2014-09-12 ENCOUNTER — Ambulatory Visit (INDEPENDENT_AMBULATORY_CARE_PROVIDER_SITE_OTHER): Payer: Medicare Other | Admitting: Internal Medicine

## 2014-09-12 ENCOUNTER — Telehealth: Payer: Self-pay | Admitting: *Deleted

## 2014-09-12 VITALS — BP 103/65 | HR 96 | Temp 98.1°F | Resp 20 | Ht 63.5 in | Wt 219.0 lb

## 2014-09-12 DIAGNOSIS — I10 Essential (primary) hypertension: Secondary | ICD-10-CM | POA: Diagnosis not present

## 2014-09-12 DIAGNOSIS — E876 Hypokalemia: Secondary | ICD-10-CM | POA: Diagnosis not present

## 2014-09-12 DIAGNOSIS — R2681 Unsteadiness on feet: Secondary | ICD-10-CM | POA: Diagnosis not present

## 2014-09-12 DIAGNOSIS — M159 Polyosteoarthritis, unspecified: Secondary | ICD-10-CM | POA: Diagnosis not present

## 2014-09-12 DIAGNOSIS — R634 Abnormal weight loss: Secondary | ICD-10-CM

## 2014-09-12 DIAGNOSIS — M7502 Adhesive capsulitis of left shoulder: Secondary | ICD-10-CM

## 2014-09-12 DIAGNOSIS — E119 Type 2 diabetes mellitus without complications: Secondary | ICD-10-CM

## 2014-09-12 DIAGNOSIS — I509 Heart failure, unspecified: Secondary | ICD-10-CM

## 2014-09-12 DIAGNOSIS — M5031 Other cervical disc degeneration,  high cervical region: Secondary | ICD-10-CM | POA: Diagnosis not present

## 2014-09-12 DIAGNOSIS — M5032 Other cervical disc degeneration, mid-cervical region: Secondary | ICD-10-CM | POA: Diagnosis not present

## 2014-09-12 LAB — BASIC METABOLIC PANEL WITH GFR
BUN: 16 mg/dL (ref 6–23)
CHLORIDE: 105 meq/L (ref 96–112)
CO2: 26 meq/L (ref 19–32)
CREATININE: 0.68 mg/dL (ref 0.50–1.10)
Calcium: 9.1 mg/dL (ref 8.4–10.5)
GFR, Est African American: >89 mL/min
GFR, Est Non African American: >89 mL/min
Glucose, Bld: 100 mg/dL — ABNORMAL HIGH (ref 70–99)
Potassium: 3.6 mEq/L (ref 3.5–5.3)
Sodium: 142 mEq/L (ref 135–145)

## 2014-09-12 LAB — GLUCOSE, CAPILLARY: Glucose-Capillary: 94 mg/dL (ref 70–99)

## 2014-09-12 LAB — POCT GLYCOSYLATED HEMOGLOBIN (HGB A1C): Hemoglobin A1C: 5.9

## 2014-09-12 MED ORDER — FUROSEMIDE 80 MG PO TABS: 40.0000 mg | ORAL_TABLET | Freq: Every day | ORAL | Status: DC

## 2014-09-12 NOTE — Patient Instructions (Addendum)
General Instructions:  Please bring your medicines with you each time you come to clinic.  Medicines may include prescription medications, over-the-counter medications, herbal remedies, eye drops, vitamins, or other pills.  Dear Ms. Fingerhut,  Thank you for coming in today. I am glad to hear the visit with Dr. Freda Munro went well and you have a little more mobility in the shoulder. Please continue with his recommendations and social work will work on the PT evaluation to be set up.   We are concerned about your weight loss. Great job of quitting smoking. When you are ready for a mammogram, hopefully soon, please let us know and we can help set that up  Please reconsider getting your vaccinations  If you continue to have weight-loss despite eating regular meals please let Dr. Arcelia Jew know  We talked about decreasing your lasix today to 40mg  and then hopefully being able to stop it completely  We stopped glipizide today due to your great control of diabetes!  Follow up with Dr. Arcelia Jew on 3/15 as already scheduled  Progress Toward Treatment Goals:  Treatment Goal 09/12/2014  Hemoglobin A1C improved  Blood pressure at goal  Stop smoking -    Self Care Goals & Plans:  Self Care Goal 09/12/2014  Manage my medications take my medicines as prescribed; bring my medications to every visit; refill my medications on time  Monitor my health keep track of my blood glucose  Eat healthy foods drink diet soda or water instead of juice or soda; eat foods that are low in salt; eat smaller portions  Be physically active find an activity I enjoy    Home Blood Glucose Monitoring 06/20/2014  Check my blood sugar once a day  When to check my blood sugar -   Care Management & Community Referrals:  No flowsheet data found.

## 2014-09-12 NOTE — Assessment & Plan Note (Addendum)
BP Readings from Last 3 Encounters:  09/12/14 103/65  09/05/14 96/64  08/04/14 102/62   Lab Results  Component Value Date   NA 139 09/05/2014   K 4.0 09/05/2014   CREATININE 0.88 09/05/2014   Assessment: Blood pressure control: controlled Progress toward BP goal:  at goal Comments: improved since hospital admission where she was noted to have some hypotension. I do not see why she needs lasix 80mg  daily based on prior chart review but has been on it for several years. Has not taken medications today. Lasix only M-Th  Plan: Medications:  Continue toprol xl 50mg  daily, enalapril 20mg  bid. I think it is reasonable to start tapering lasix dose, will drop to 40mg  today and hopefully be able to titrate off medication soon. She has no lower extremity edema and does not appear to have volume overload or issues with volume in the recent past either.  Other plans: bmet today, monitor K (hypokalemic during admission). Continue K supplementation for now as long as on lasix but d/c once off lasix or if K getting high given decreased dose of lasix  Continue to monitor for hypotension, may need to decrease other doses of medications if recurrent. Unable to check orthostatics today as she cannot lie down due to shoulder, but could consider on follow up visit

## 2014-09-12 NOTE — Assessment & Plan Note (Addendum)
Walks with cane, no recent falls and says she is not clumsy. Denies stroke like symptoms but feels like she gets unsteady in her body at times, not so much light-headed at this time. Cerebellar and cranial nerve testing were done today and no obvious abnormalities although when checking romberg near the end of the time she did get a little off balance but no arm drop or dizziness noted. 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 However, should me monitored closely.  She is due for home PT who can hopefully provide some evaluation as well. She is advised on stroke like symptoms including slurred speech, weakness in extremities, syncope or near syncope and advised to notify us immediately if she has any of the above symptoms or if her gait is worsening. As she was here for hospital follow up and we focused more on shoulder, diabetes, and hypokalemia, she would benefit from further evaluation with pcp next month if not sooner (limited transportation as she is reliant on son at this time)  If persistent or worsening, could consider neuro imaging (?CT head/MRI) but does not appear to have had an acute stroke at this time, although perhaps neurological component exists ?MS given her steady decline (son feels like since January more noticeable), balance issues, unexplained weightloss? Will discuss and defer to PCP who will see in one month.   TSH and vitamin b12 wnl on check in November

## 2014-09-12 NOTE — Assessment & Plan Note (Signed)
Her weightloss is certainly concerning. She has lost another ~16 pounds since November and says it is unintentional. She says she eats at least three times a day. She does have a long history of smoking cigarettes up to 1.5 packs per day but quit last year when her son moved out. She denies any blood in stool or urine. Had a colonoscopy done in 07/2008 by Dr. Deatra Ina that was normal. She is overdue for mammogram and papsmear but she does not wish to have those done today. I offered referral for at least mammogram today but she said she will think about it and wants to wait. I encouraged her to strongly consider getting her screening done and that we can help to set up the appointments but she said she will let us know and was not interested at this time. Additionally, given her smoking history, we could start with CXR for possible lung mass? Or CT, however, again she is not currently entertaining work up or detailed conversation at this time but we did review options and I strongly encouraged her to think about it. I think she will eventually be willing to do this once her shoulder is better and improved pain.  I think her high dose lasix could be contributing to some excessive volume removal which we will decrease today and hopefully be able to stop completely. She is in agreement for that.  I have encouraged her to eat balanced meals regular at least 3 times a day and to follow up with Dr. Arcelia Jew next month. If weight continues to trend down, we can start with weightloss work up with hopefully at least lung imaging if she agrees. Her son also tried to encourage her for screening but she cut that conversation short as well. In the meantime, she is encouraged to let us know if weight continues to drop or if she has any other issues.

## 2014-09-12 NOTE — Assessment & Plan Note (Addendum)
bmet today Decreased lasix dose to 40mg  and hopefully titrate to off

## 2014-09-12 NOTE — Assessment & Plan Note (Signed)
Last echo in 2007 showed improved EF, but she remains on ACEi, high dose lasix, and BB.   Consider repeat echo. Titrating lasix dose starting today. May need to decrease dose of other medications if hypotension recurrent

## 2014-09-12 NOTE — Progress Notes (Signed)
Subjective:   Patient ID: Julie Elliott female   DOB: June 06, 1956 59 y.o.   MRN: [id number]  HPI: Julie Elliott is a 59 y.o. female who presents to opc today for hospital follow up visit.   Hospital admission 2/1-09/05/2014 for left shoulder pain--was to follow up with [facility] on 09/08/14 for further assessment.  She was advised to use heat to the area and prescribed voltaren gel and robaxin for pain. Home PT and OT services were also initiated. L shoulder xray during admission showed AC and glenohumeral OA with no acute bony abnormality.  C-spine xray showed DDD C4-C5 through C6-C7. Bony neural foraminal narrowing at C5-C6. No acute bony abnormality. L hand xray showed mild OA.   Hypotension during hospital--home medications include metoprolol, enalapril, and lasix. BP during admission noted to be 80-90/60s.  Enalapril and lasix was held during admission and restarted on discharged. CT angio was done but negative for PE.   Hypokalemia--on lasix 80mg  daily. K replaced during admission and 4.0 on discharge. Was 3.2 on admission.   DM2--well controlled. Will check A1C today. Last A1C in November 2015 was 6.8. She is on glipizide and metformin. Significant weight loss since November 2015 when she was 235lb's and during admission and down to 219lb's today. Unintentional weight loss. Hx of smoking but quit last year. Denies any blood in still or urine. Has had a colonoscopy. Due for mammogram but does not wish to get it done today despite going over importance of screening and my recommendation to have it done and scheduled.   CHF--carries a diagnosis of CHF and was on digoxin, ASA, lasix, enalapril and metoprolol in the past.  Digoxin was discontinued in 2014 and she has been on lasix, BB, ACEi and ASA since then. Last echo is 2007 showed EF 55-65%, normal LV systolic function. She denies lower extremity edema or volume overload and says she has been on current regimen for several years.   Son has FMLA  paperwork for PCP to complete due to him being out of work and primary caretaker for his mother. He also reports progressive functional decline in his mother since January, mainly in her mobility.   Past Medical History  Diagnosis Date  . Hypertension     well controlled  . DJD (degenerative joint disease)   . HLD (hyperlipidemia) 2008    stable, well controlled  . DM (diabetes mellitus) 2008    stable HgBA1C at 6.5  . GERD (gastroesophageal reflux disease)     well controlled on Omeprazole  . Chronic headaches   . CHF (congestive heart failure)    Current Outpatient Prescriptions  Medication Sig Dispense Refill  . acetaminophen (TYLENOL) 650 MG CR tablet Take 1 tablet (650 mg total) by mouth every 8 (eight) hours as needed for pain. 30 tablet 0  . aspirin 81 MG EC tablet Take 1 tablet (81 mg total) by mouth daily. 90 tablet 1  . B-D ULTRA-FINE 33 LANCETS MISC Use for checking blood sugar, 3 times per day. Diagnosis Code is 250.00 100 each 11  . Calcium-Vitamin D 600-125 MG-UNIT TABS Take 1 tablet by mouth daily. 90 each 1  . diclofenac sodium (VOLTAREN) 1 % GEL Apply 2 g topically 4 (four) times daily. 100 g 0  . enalapril (VASOTEC) 20 MG tablet Take 1 tablet (20 mg total) by mouth 2 (two) times daily. 180 tablet 3  . furosemide (LASIX) 80 MG tablet Take 1 tablet (80 mg total) by mouth daily. 90 tablet  3  . gabapentin (NEURONTIN) 300 MG capsule Day 1: 300 mg (1tablet) once daily Day 2: 600 mg (2 tablets) once daily Days 3-6: 900 (3 tablets) mg once daily Days 7 on : 1,200 (4 tablets) mg once daily 90 capsule 0  . glipiZIDE (GLUCOTROL) 10 MG tablet Take 1 tablet (10 mg total) by mouth 2 (two) times daily before a meal. 240 tablet 5  . glucose blood (BAYER CONTOUR TEST) test strip [employer]. Use to test blood sugar 3 times a day. 100 each 11  . Insulin Glargine (LANTUS SOLOSTAR) 100 UNIT/ML Solostar Pen Inject 30 Units into the skin 2 (two) times daily. 30 mL 5  . Insulin Pen Needle 32G  X 4 MM MISC Use for insulin injection, bid. Diagnosis code is 250.00 for DM-II. 100 each 11  . metFORMIN (GLUCOPHAGE) 500 MG tablet Take 1 tablet (500 mg total) by mouth daily with breakfast. 30 tablet 3  . methocarbamol (ROBAXIN) 750 MG tablet Take 1 tablet (750 mg total) by mouth 3 (three) times daily. 90 tablet 0  . metoprolol succinate (TOPROL-XL) 50 MG 24 hr tablet Take 1 tablet (50 mg total) by mouth daily. 90 tablet 3  . naproxen (NAPROSYN) 500 MG tablet Take 1 tablet (500 mg total) by mouth 2 (two) times daily. 180 tablet 3  . omeprazole (PRILOSEC) 20 MG capsule Take 1 capsule (20 mg total) by mouth 2 (two) times daily. 180 capsule 2  . potassium chloride SA (K-DUR,KLOR-CON) 20 MEQ tablet Take 1 tablet (20 mEq total) by mouth daily. 90 tablet 3  . simvastatin (ZOCOR) 40 MG tablet Take 1 tablet (40 mg total) by mouth daily. 90 tablet 3  . traMADol (ULTRAM) 50 MG tablet Take 1 tablet (50 mg total) by mouth every 6 (six) hours as needed. 15 tablet 0   No current facility-administered medications for this visit.   Family History  Problem Relation Age of Onset  . Diabetes Mother   . Heart disease Mother   . Prostate cancer Father    History   Social History  . Marital Status: Single    Spouse Name: N/A    Number of Children: N/A  . Years of Education: N/A   Social History Main Topics  . Smoking status: Former Smoker -- 0.30 packs/day    Types: Cigarettes    Quit date: 04/18/2013  . Smokeless tobacco: Not on file     Comment: Smokes 5-6 cigerettes per day  . Alcohol Use: No  . Drug Use: Not on file  . Sexual Activity: Not on file   Other Topics Concern  . Not on file   Social History Narrative   to get supplies nfrom [employer] per pt request.[phone]   Review of Systems:  Constitutional:  Denies fever, chills  Respiratory:  Denies SOB  Cardiovascular:  Denies chest pain  Gastrointestinal:  Denies nausea, vomiting, abdominal pain  Genitourinary:  Denies  dysuria, hematuria, and difficulty urinating.   Musculoskeletal:  Arthritis, gait problem, L shoulder pain, tingling in hands and feet at times  Skin:  Dry skin  Neurological:  Denies headaches. Has felt light-headed in the past   Objective:  Physical Exam: Filed Vitals:   09/12/14 0956  BP: 103/65  Pulse: 96  Temp: 98.1 F (36.7 C)  TempSrc: Oral  Resp: 20  Height: 5' 3.5" (1.613 m)  Weight: 219 lb (99.338 kg)  SpO2: 100%   Vitals reviewed. General: sitting in wheelchair, NAD HEENT: EOMI Cardiac: RRR Pulm: clear  to auscultation bilaterally, no wheezes, rales, or rhonchi Abd: soft, BS present Ext: warm and well perfused, no pedal edema, DP B/L, +2 on left, slightly diminished and harder to locate on right foot, L shoulder with limited mobility but improved per patient since hospitalization. Unable to lift left arm above head but gets to chest area. Able to put both hands behind her back but cannot get to her bra strap. Able to shrug shoulders. No tenderness to palpation or erythema to left shoulder.   Neuro: alert and oriented X3, cranial nerves testing with no slurred speech or tongue deviation, able to puff cheeks, frown, and close eyes tightly, shrugging shoulders. Finger to nose done with right arm with no difficulty. Able to stand without assistance but slow mobility. uses cane for balance. Was able to stand and close eyes without assistance and held steadiness until the end where she felt slightly unstable, no drop in arms. Strength and sensation to light touch equal in bilateral upper and lower extremities and face. Does endorse cool sensation and tingling in hands and feet. Decreased strength in LUE compared to right. Hardened and darkened toe nails, mainly great toes of both feet. -edema or tenderness to palpation of extremities Skin: Very dry  Assessment & Plan:  Discussed with Dr. Dareen Piano

## 2014-09-12 NOTE — Telephone Encounter (Signed)
Eric with [facility] [phone] called for orders to extend home PT therapy. Hilda Blades Landynn Dupler RN 09/12/14 4:30PM

## 2014-09-12 NOTE — Assessment & Plan Note (Addendum)
Lab Results  Component Value Date   HGBA1C 5.9 09/12/2014   HGBA1C 6.8 06/20/2014   HGBA1C 6.2 01/03/2014    Assessment: Diabetes control: good control (HgbA1C at goal) Progress toward A1C goal:    improved Comments: recheck a1c today improved to 5.9 , remains well controlled  Plan: Medications:  continue current medications d/c glipizide. Continue metformin Home glucose monitoring: Frequency:   Timing:   Instruction/counseling given: reminded to bring blood glucose meter & log to each visit and reminded to bring medications to each visit Educational resources provided:   Self management tools provided:   Other plans: she has lost almost 16 pounds since October. Transitioned to monotherapy today with follow up with pcp scheduled for next month

## 2014-09-12 NOTE — Assessment & Plan Note (Addendum)
Recent hospital admission for shoulder pain. Saw ortho, Dr. Freda Munro on 2/5. Given steroid injection that has helped some with mobility but still limited. Recommended for home PT by ortho and inpatient team but has not started yet as they have not been in contact.   -social work to fax paperwork again to [place] -encouraged to continue mobility as tolerated -she denies any help with muscle relaxer, will not refill -continue voltaren gel to area if helping -takes aleve or naproxen to help with pain at times Son needs pcp to fill out FMLA paperwork

## 2014-09-14 NOTE — Telephone Encounter (Signed)
Ok to extend home PT.

## 2014-09-15 NOTE — Progress Notes (Signed)
INTERNAL MEDICINE TEACHING ATTENDING ADDENDUM - Lauraine Crespo, MD: I reviewed and discussed at the time of visit with the resident Dr. Qureshi, the patient's medical history, physical examination, diagnosis and results of pertinent tests and treatment and I agree with the patient's care as documented.  

## 2014-09-19 ENCOUNTER — Telehealth: Payer: Self-pay | Admitting: *Deleted

## 2014-09-19 NOTE — Telephone Encounter (Signed)
Call from [facility] with [facility] requesting order for PT. Advised an order was already requested from PCP, Dr. Arcelia Jew. Phone number given to call PCP.

## 2014-09-20 DIAGNOSIS — M7502 Adhesive capsulitis of left shoulder: Secondary | ICD-10-CM | POA: Diagnosis not present

## 2014-09-20 DIAGNOSIS — M5031 Other cervical disc degeneration,  high cervical region: Secondary | ICD-10-CM | POA: Diagnosis not present

## 2014-09-20 DIAGNOSIS — I10 Essential (primary) hypertension: Secondary | ICD-10-CM | POA: Diagnosis not present

## 2014-09-20 DIAGNOSIS — M159 Polyosteoarthritis, unspecified: Secondary | ICD-10-CM | POA: Diagnosis not present

## 2014-09-20 DIAGNOSIS — E119 Type 2 diabetes mellitus without complications: Secondary | ICD-10-CM | POA: Diagnosis not present

## 2014-09-20 DIAGNOSIS — M5032 Other cervical disc degeneration, mid-cervical region: Secondary | ICD-10-CM | POA: Diagnosis not present

## 2014-09-20 NOTE — Telephone Encounter (Signed)
Left message for Randall Hiss -  Dr Arcelia Jew ok to extend home PT.

## 2014-09-21 DIAGNOSIS — I10 Essential (primary) hypertension: Secondary | ICD-10-CM | POA: Diagnosis not present

## 2014-09-21 DIAGNOSIS — M5032 Other cervical disc degeneration, mid-cervical region: Secondary | ICD-10-CM | POA: Diagnosis not present

## 2014-09-21 DIAGNOSIS — M7502 Adhesive capsulitis of left shoulder: Secondary | ICD-10-CM | POA: Diagnosis not present

## 2014-09-21 DIAGNOSIS — M5031 Other cervical disc degeneration,  high cervical region: Secondary | ICD-10-CM | POA: Diagnosis not present

## 2014-09-21 DIAGNOSIS — E119 Type 2 diabetes mellitus without complications: Secondary | ICD-10-CM | POA: Diagnosis not present

## 2014-09-21 DIAGNOSIS — M159 Polyosteoarthritis, unspecified: Secondary | ICD-10-CM | POA: Diagnosis not present

## 2014-09-22 ENCOUNTER — Telehealth: Payer: Self-pay | Admitting: *Deleted

## 2014-09-22 DIAGNOSIS — M5032 Other cervical disc degeneration, mid-cervical region: Secondary | ICD-10-CM | POA: Diagnosis not present

## 2014-09-22 DIAGNOSIS — M159 Polyosteoarthritis, unspecified: Secondary | ICD-10-CM | POA: Diagnosis not present

## 2014-09-22 DIAGNOSIS — I10 Essential (primary) hypertension: Secondary | ICD-10-CM | POA: Diagnosis not present

## 2014-09-22 DIAGNOSIS — M5031 Other cervical disc degeneration,  high cervical region: Secondary | ICD-10-CM | POA: Diagnosis not present

## 2014-09-22 DIAGNOSIS — E119 Type 2 diabetes mellitus without complications: Secondary | ICD-10-CM | POA: Diagnosis not present

## 2014-09-22 DIAGNOSIS — M7502 Adhesive capsulitis of left shoulder: Secondary | ICD-10-CM | POA: Diagnosis not present

## 2014-09-22 NOTE — Telephone Encounter (Signed)
Yes, I approve for OT.

## 2014-09-22 NOTE — Telephone Encounter (Signed)
Call from Randall Hiss, Physical Therapist with [facility] - # [phone]  PT is asking for an order for OT.  She has weakness of left upper extremity and difficulty bathing. I can take a Verbal Order  Will this be okay with you?

## 2014-09-26 DIAGNOSIS — M5031 Other cervical disc degeneration,  high cervical region: Secondary | ICD-10-CM | POA: Diagnosis not present

## 2014-09-26 DIAGNOSIS — E119 Type 2 diabetes mellitus without complications: Secondary | ICD-10-CM | POA: Diagnosis not present

## 2014-09-26 DIAGNOSIS — I10 Essential (primary) hypertension: Secondary | ICD-10-CM | POA: Diagnosis not present

## 2014-09-26 DIAGNOSIS — M7502 Adhesive capsulitis of left shoulder: Secondary | ICD-10-CM | POA: Diagnosis not present

## 2014-09-26 DIAGNOSIS — M159 Polyosteoarthritis, unspecified: Secondary | ICD-10-CM | POA: Diagnosis not present

## 2014-09-26 DIAGNOSIS — M5032 Other cervical disc degeneration, mid-cervical region: Secondary | ICD-10-CM | POA: Diagnosis not present

## 2014-09-27 DIAGNOSIS — M5032 Other cervical disc degeneration, mid-cervical region: Secondary | ICD-10-CM | POA: Diagnosis not present

## 2014-09-27 DIAGNOSIS — M7502 Adhesive capsulitis of left shoulder: Secondary | ICD-10-CM | POA: Diagnosis not present

## 2014-09-27 DIAGNOSIS — M5031 Other cervical disc degeneration,  high cervical region: Secondary | ICD-10-CM | POA: Diagnosis not present

## 2014-09-27 DIAGNOSIS — M159 Polyosteoarthritis, unspecified: Secondary | ICD-10-CM | POA: Diagnosis not present

## 2014-09-27 DIAGNOSIS — I10 Essential (primary) hypertension: Secondary | ICD-10-CM | POA: Diagnosis not present

## 2014-09-27 DIAGNOSIS — E119 Type 2 diabetes mellitus without complications: Secondary | ICD-10-CM | POA: Diagnosis not present

## 2014-09-29 DIAGNOSIS — E119 Type 2 diabetes mellitus without complications: Secondary | ICD-10-CM | POA: Diagnosis not present

## 2014-09-29 DIAGNOSIS — M7502 Adhesive capsulitis of left shoulder: Secondary | ICD-10-CM | POA: Diagnosis not present

## 2014-09-29 DIAGNOSIS — M5032 Other cervical disc degeneration, mid-cervical region: Secondary | ICD-10-CM | POA: Diagnosis not present

## 2014-09-29 DIAGNOSIS — I10 Essential (primary) hypertension: Secondary | ICD-10-CM | POA: Diagnosis not present

## 2014-09-29 DIAGNOSIS — M5031 Other cervical disc degeneration,  high cervical region: Secondary | ICD-10-CM | POA: Diagnosis not present

## 2014-09-29 DIAGNOSIS — M159 Polyosteoarthritis, unspecified: Secondary | ICD-10-CM | POA: Diagnosis not present

## 2014-10-02 DIAGNOSIS — M5031 Other cervical disc degeneration,  high cervical region: Secondary | ICD-10-CM | POA: Diagnosis not present

## 2014-10-02 DIAGNOSIS — M7502 Adhesive capsulitis of left shoulder: Secondary | ICD-10-CM | POA: Diagnosis not present

## 2014-10-02 DIAGNOSIS — I10 Essential (primary) hypertension: Secondary | ICD-10-CM | POA: Diagnosis not present

## 2014-10-02 DIAGNOSIS — M159 Polyosteoarthritis, unspecified: Secondary | ICD-10-CM | POA: Diagnosis not present

## 2014-10-02 DIAGNOSIS — M5032 Other cervical disc degeneration, mid-cervical region: Secondary | ICD-10-CM | POA: Diagnosis not present

## 2014-10-02 DIAGNOSIS — E119 Type 2 diabetes mellitus without complications: Secondary | ICD-10-CM | POA: Diagnosis not present

## 2014-10-03 ENCOUNTER — Other Ambulatory Visit: Payer: Self-pay | Admitting: *Deleted

## 2014-10-03 DIAGNOSIS — K219 Gastro-esophageal reflux disease without esophagitis: Secondary | ICD-10-CM

## 2014-10-04 ENCOUNTER — Other Ambulatory Visit: Payer: Self-pay | Admitting: Internal Medicine

## 2014-10-04 DIAGNOSIS — E119 Type 2 diabetes mellitus without complications: Secondary | ICD-10-CM | POA: Diagnosis not present

## 2014-10-04 DIAGNOSIS — M7502 Adhesive capsulitis of left shoulder: Secondary | ICD-10-CM | POA: Diagnosis not present

## 2014-10-04 DIAGNOSIS — M5031 Other cervical disc degeneration,  high cervical region: Secondary | ICD-10-CM | POA: Diagnosis not present

## 2014-10-04 DIAGNOSIS — M159 Polyosteoarthritis, unspecified: Secondary | ICD-10-CM | POA: Diagnosis not present

## 2014-10-04 DIAGNOSIS — M5032 Other cervical disc degeneration, mid-cervical region: Secondary | ICD-10-CM | POA: Diagnosis not present

## 2014-10-04 DIAGNOSIS — I10 Essential (primary) hypertension: Secondary | ICD-10-CM | POA: Diagnosis not present

## 2014-10-06 DIAGNOSIS — E119 Type 2 diabetes mellitus without complications: Secondary | ICD-10-CM | POA: Diagnosis not present

## 2014-10-06 DIAGNOSIS — M159 Polyosteoarthritis, unspecified: Secondary | ICD-10-CM | POA: Diagnosis not present

## 2014-10-06 DIAGNOSIS — I10 Essential (primary) hypertension: Secondary | ICD-10-CM | POA: Diagnosis not present

## 2014-10-06 DIAGNOSIS — M5032 Other cervical disc degeneration, mid-cervical region: Secondary | ICD-10-CM | POA: Diagnosis not present

## 2014-10-06 DIAGNOSIS — M5031 Other cervical disc degeneration,  high cervical region: Secondary | ICD-10-CM | POA: Diagnosis not present

## 2014-10-06 DIAGNOSIS — M7502 Adhesive capsulitis of left shoulder: Secondary | ICD-10-CM | POA: Diagnosis not present

## 2014-10-10 DIAGNOSIS — M159 Polyosteoarthritis, unspecified: Secondary | ICD-10-CM | POA: Diagnosis not present

## 2014-10-10 DIAGNOSIS — I10 Essential (primary) hypertension: Secondary | ICD-10-CM | POA: Diagnosis not present

## 2014-10-10 DIAGNOSIS — M7502 Adhesive capsulitis of left shoulder: Secondary | ICD-10-CM | POA: Diagnosis not present

## 2014-10-10 DIAGNOSIS — E119 Type 2 diabetes mellitus without complications: Secondary | ICD-10-CM | POA: Diagnosis not present

## 2014-10-10 DIAGNOSIS — M5031 Other cervical disc degeneration,  high cervical region: Secondary | ICD-10-CM | POA: Diagnosis not present

## 2014-10-10 DIAGNOSIS — M5032 Other cervical disc degeneration, mid-cervical region: Secondary | ICD-10-CM | POA: Diagnosis not present

## 2014-10-17 ENCOUNTER — Ambulatory Visit (INDEPENDENT_AMBULATORY_CARE_PROVIDER_SITE_OTHER): Payer: Medicare Other | Admitting: Internal Medicine

## 2014-10-17 ENCOUNTER — Encounter: Payer: Self-pay | Admitting: Internal Medicine

## 2014-10-17 VITALS — BP 102/65 | HR 98 | Temp 98.4°F | Ht 63.5 in | Wt 201.8 lb

## 2014-10-17 DIAGNOSIS — I509 Heart failure, unspecified: Secondary | ICD-10-CM | POA: Diagnosis not present

## 2014-10-17 DIAGNOSIS — M7502 Adhesive capsulitis of left shoulder: Secondary | ICD-10-CM | POA: Diagnosis not present

## 2014-10-17 DIAGNOSIS — R2681 Unsteadiness on feet: Secondary | ICD-10-CM | POA: Diagnosis not present

## 2014-10-17 DIAGNOSIS — Z0000 Encounter for general adult medical examination without abnormal findings: Secondary | ICD-10-CM | POA: Diagnosis not present

## 2014-10-17 DIAGNOSIS — R634 Abnormal weight loss: Secondary | ICD-10-CM

## 2014-10-17 DIAGNOSIS — E119 Type 2 diabetes mellitus without complications: Secondary | ICD-10-CM

## 2014-10-17 DIAGNOSIS — I10 Essential (primary) hypertension: Secondary | ICD-10-CM

## 2014-10-17 NOTE — Patient Instructions (Addendum)
It was a pleasure taking care of you, [name].   1. High blood pressure - Continue taking Enalapril and Metoprolol  - Stop taking Lasix  2. Type 2 Diabetes  - Continue taking metformin 500 mg daily  3. Unsteadiness on feet - Continue physical therapy  4. Weight loss - Mammogram- you will be called to schedule - Pap smear- make appointment in next few weeks in clinic - Check liver tests - Schedule nutrition class with Butch Penny   General Instructions:   Thank you for bringing your medicines today. This helps [place] keep you safe from mistakes.   Progress Toward Treatment Goals:  Treatment Goal 09/12/2014  Hemoglobin A1C improved  Blood pressure at goal  Stop smoking -    Self Care Goals & Plans:  Self Care Goal 09/12/2014  Manage my medications take my medicines as prescribed; bring my medications to every visit; refill my medications on time  Monitor my health keep track of my blood glucose  Eat healthy foods drink diet soda or water instead of juice or soda; eat foods that are low in salt; eat smaller portions  Be physically active find an activity I enjoy    Home Blood Glucose Monitoring 06/20/2014  Check my blood sugar once a day  When to check my blood sugar -     Care Management & Community Referrals:  No flowsheet data found.

## 2014-10-18 LAB — HEPATIC FUNCTION PANEL
ALBUMIN: 3.9 g/dL (ref 3.5–5.2)
ALK PHOS: 82 U/L (ref 39–117)
ALT: 11 U/L (ref 0–35)
AST: 12 U/L (ref 0–37)
Bilirubin, Direct: 0.2 mg/dL (ref 0.0–0.3)
Indirect Bilirubin: 0.4 mg/dL (ref 0.2–1.2)
TOTAL PROTEIN: 6.9 g/dL (ref 6.0–8.3)
Total Bilirubin: 0.6 mg/dL (ref 0.2–1.2)

## 2014-10-18 NOTE — Progress Notes (Signed)
   Subjective:    Patient ID: Julie Elliott, female    DOB: Dec 19, 1955, 59 y.o.   MRN: [id number]  HPI Julie Elliott is a 59yo woman with PMHx of HTN, systolic CHF, GERD, Type 2 DM, and hyperlipidemia who presents today for the following:  1. HTN: BP today 102/65. Her Lasix was decreased to 40 mg at her last visit. She is also on Enalapril 20 mg BID and Metoprolol 50 mg daily. She denies swelling in her legs and dizziness.   2. Type 2 DM: Last HbA1c 5.9 om 09/12/14. Her Glipizide was discontinued at her last visit. She is currently taking Metformin 500 mg daily. She denies blurry vision, polydipsia, and polyuria.   4. Frozen Left Shoulder: Patient reports her pain is well controlled with Tylenol. She states the voltaren gel helps a little as well. She states overall her shoulder is not bothering her much. She reports physical therapy has improved both the pain and function of her left shoulder.   5. Unsteadiness on Feet: Patient reports she is still "off balance" but that physical therapy has been helping. She denies any dizziness or vertigo.   6. Unintentional Weight Loss: Patient's weight continues to decrease. Her weight today is 208 lbs, she was 219 lbs at her last visit on 09/12/14. She has lost almost 60 lbs since June 2015. She has not been cooperative in the past about pursuing work up, but today is more agreeable. She reports good appetite. Her son, [name], accompanied her to the visit and states she eats well when he is with her. She denies diarrhea, night sweats, fevers, bony pain, dark or bloody stool. Her last colonoscopy was in 2009 and was normal. She is not anemic, with Hbg 14.1 on 09/04/14.    Review of Systems General: See above HEENT: Denies headaches, ear pain, rhinorrhea, sore throat CV: Denies CP, palpitations, SOB, orthopnea Pulm: Denies SOB, cough, wheezing GI: Denies abdominal pain, nausea, vomiting, constipation GU: Denies dysuria, hematuria, frequency Msk: Denies muscle cramps,  joint pains Neuro: Denies weakness, numbness, tingling Skin: Denies rashes, bruising    Objective:   Physical Exam General: alert, sitting up in chair, pleasant, NAD HEENT: [place]/AT, EOMI, sclera anicteric, mucus membranes moist CV: RRR, no m/g/r Pulm: CTA bilaterally, breaths non-labored Abd: BS+, soft, non-tender, no organomegaly Ext: warm, no edema in lower extremities. ROM limited to 90 degrees in left upper extremity. ROM normal in all other extremities.  Neuro: alert and oriented x 3, no focal deficits, strength intact    Assessment & Plan:

## 2014-10-19 NOTE — Assessment & Plan Note (Signed)
Lab Results  Component Value Date   HGBA1C 5.9 09/12/2014   HGBA1C 6.8 06/20/2014   HGBA1C 6.2 01/03/2014     Assessment: Diabetes control: good control (HgbA1C at goal) Progress toward A1C goal:  at goal  Plan: Medications:  continue current medications Instruction/counseling given: reminded to bring blood glucose meter & log to each visit, reminded to bring medications to each visit and discussed diet  Glipizide was discontinued at last visit. She is currently taking Metformin 500 mg daily. Repeat HbA1c in May.

## 2014-10-19 NOTE — Assessment & Plan Note (Signed)
Patient with history of systolic HF (EF 88-82%), but repeat echo in 2007 showed improvement of EF with 55-65%. Patient does not appear fluid overloaded on exam and has not been in the past. Her Lasix was titrated down at her last visit to 40 mg daily. Will discontinue Lasix given this could be contributing to her unintentional weight loss and her BP has been on lower side.  - Discontinue Lasix. Recheck BP and volume status in 2-4 weeks. - Also check bmet to assess potassium. May need to stop potassium supplementation since stopping Lasix.  - Continue Enalapril 20 mg BID - Continue Metoprolol 50 mg daily

## 2014-10-19 NOTE — Assessment & Plan Note (Signed)
Her shoulder pain and functionality has improved with physical therapy. She states her pain is well controlled with Tylenol PRN and voltaren gel PRN.  - Continue home PT - Continue Tylenol and voltaren gel PRN

## 2014-10-19 NOTE — Assessment & Plan Note (Signed)
-   Patient agreed to schedule mammogram, will get at [facility] hospital - Pap smear scheduled in next 2-4 weeks - Refused pneumococcal, influenza, and tetanus vaccines

## 2014-10-19 NOTE — Assessment & Plan Note (Signed)
Patient states her unsteadiness is improving with physical therapy. She denies any dizziness or vertigo.  - Continue PT - Continue to monitor - May need to consider brain imaging if does not continue to improve

## 2014-10-19 NOTE — Assessment & Plan Note (Addendum)
BP Readings from Last 3 Encounters:  10/17/14 102/65  09/12/14 103/65  09/05/14 96/64    Lab Results  Component Value Date   NA 142 09/12/2014   K 3.6 09/12/2014   CREATININE 0.68 09/12/2014    Assessment: Blood pressure control: controlled Progress toward BP goal:  at goal  Plan: Medications:  continue current medications, see below  Patient is not fluid overloaded on exam and has never been in past. Will discontinue Lasix given this could be contributing to her unintentional weight loss (volume depletion) and her BP has been on lower side. Will need to monitor closely given her history of systolic HF (EF 70-26% in 2004, but then EF 55-65% on 2007 echo).  - Discontinue Lasix. Recheck BP in 2-4 weeks, assess volume status. - Continue Enalapril 20 mg BID - Continue Metoprolol 50 mg daily

## 2014-10-19 NOTE — Assessment & Plan Note (Addendum)
Patient's weight continues to decrease. She has lost almost 60 lbs since June 2015. Her weight dropped from 219 lbs at her visit in Feb to 208 lbs today. There is definitely concern for possible malignancy given she is still losing weight despite good appetite. She had a colonoscopy in 2009 that was normal and Hbg on 09/04/14 normal at 14.1 so colon less likely. She had a CTA chest on 09/05/14 that did not show any lung mass. Malabsorption is on the differential, but patient denies diarrhea or watery stools. Patient refused to have HIV checked stating that she has not been sexually active in over 30 years and has never used IV drugs.  - Will check LFTs for possible liver source - Patient has agreed to getting mammogram (has refused in past), will schedule - Patient also agreed to pap smear since she is overdue, will schedule in next 2-4 weeks - Nutrition referral >> patient refused to meet with nutritionist. She agreed to keep a log of her food intake for her next visit.   Addendum: LFTs normal

## 2014-10-20 DIAGNOSIS — M79642 Pain in left hand: Secondary | ICD-10-CM | POA: Diagnosis not present

## 2014-10-20 DIAGNOSIS — M7502 Adhesive capsulitis of left shoulder: Secondary | ICD-10-CM | POA: Diagnosis not present

## 2014-10-20 NOTE — Progress Notes (Signed)
Internal Medicine Clinic Attending  Case discussed with Dr. Rivet at the time of the visit.  We reviewed the resident's history and exam and pertinent patient test results.  I agree with the assessment, diagnosis, and plan of care documented in the resident's note.  

## 2014-10-23 ENCOUNTER — Other Ambulatory Visit: Payer: Self-pay | Admitting: Internal Medicine

## 2014-10-23 ENCOUNTER — Telehealth: Payer: Self-pay | Admitting: *Deleted

## 2014-10-23 DIAGNOSIS — M159 Polyosteoarthritis, unspecified: Secondary | ICD-10-CM | POA: Diagnosis not present

## 2014-10-23 DIAGNOSIS — M5032 Other cervical disc degeneration, mid-cervical region: Secondary | ICD-10-CM | POA: Diagnosis not present

## 2014-10-23 DIAGNOSIS — I10 Essential (primary) hypertension: Secondary | ICD-10-CM | POA: Diagnosis not present

## 2014-10-23 DIAGNOSIS — M5031 Other cervical disc degeneration,  high cervical region: Secondary | ICD-10-CM | POA: Diagnosis not present

## 2014-10-23 DIAGNOSIS — M7502 Adhesive capsulitis of left shoulder: Secondary | ICD-10-CM | POA: Diagnosis not present

## 2014-10-23 DIAGNOSIS — Z1231 Encounter for screening mammogram for malignant neoplasm of breast: Secondary | ICD-10-CM

## 2014-10-23 DIAGNOSIS — E119 Type 2 diabetes mellitus without complications: Secondary | ICD-10-CM | POA: Diagnosis not present

## 2014-10-23 NOTE — Telephone Encounter (Signed)
Eric with [facility] called needs to start OT for ADL - mostly bathing. Saw in notes last office visit - continue with PT - given to [place]. Hilda Blades Akaya Proffit RN 10/23/14 12N

## 2014-10-24 ENCOUNTER — Telehealth: Payer: Self-pay | Admitting: Internal Medicine

## 2014-10-24 NOTE — Telephone Encounter (Signed)
Wonderful, thank you

## 2014-10-25 DIAGNOSIS — M5032 Other cervical disc degeneration, mid-cervical region: Secondary | ICD-10-CM | POA: Diagnosis not present

## 2014-10-25 DIAGNOSIS — I10 Essential (primary) hypertension: Secondary | ICD-10-CM | POA: Diagnosis not present

## 2014-10-25 DIAGNOSIS — M159 Polyosteoarthritis, unspecified: Secondary | ICD-10-CM | POA: Diagnosis not present

## 2014-10-25 DIAGNOSIS — E119 Type 2 diabetes mellitus without complications: Secondary | ICD-10-CM | POA: Diagnosis not present

## 2014-10-25 DIAGNOSIS — M5031 Other cervical disc degeneration,  high cervical region: Secondary | ICD-10-CM | POA: Diagnosis not present

## 2014-10-25 DIAGNOSIS — M7502 Adhesive capsulitis of left shoulder: Secondary | ICD-10-CM | POA: Diagnosis not present

## 2014-10-27 DIAGNOSIS — M5031 Other cervical disc degeneration,  high cervical region: Secondary | ICD-10-CM | POA: Diagnosis not present

## 2014-10-27 DIAGNOSIS — M159 Polyosteoarthritis, unspecified: Secondary | ICD-10-CM | POA: Diagnosis not present

## 2014-10-27 DIAGNOSIS — E119 Type 2 diabetes mellitus without complications: Secondary | ICD-10-CM | POA: Diagnosis not present

## 2014-10-27 DIAGNOSIS — M7502 Adhesive capsulitis of left shoulder: Secondary | ICD-10-CM | POA: Diagnosis not present

## 2014-10-27 DIAGNOSIS — M5032 Other cervical disc degeneration, mid-cervical region: Secondary | ICD-10-CM | POA: Diagnosis not present

## 2014-10-27 DIAGNOSIS — I10 Essential (primary) hypertension: Secondary | ICD-10-CM | POA: Diagnosis not present

## 2014-10-30 DIAGNOSIS — M79642 Pain in left hand: Secondary | ICD-10-CM | POA: Diagnosis not present

## 2014-10-31 DIAGNOSIS — M159 Polyosteoarthritis, unspecified: Secondary | ICD-10-CM | POA: Diagnosis not present

## 2014-10-31 DIAGNOSIS — I10 Essential (primary) hypertension: Secondary | ICD-10-CM | POA: Diagnosis not present

## 2014-10-31 DIAGNOSIS — E119 Type 2 diabetes mellitus without complications: Secondary | ICD-10-CM | POA: Diagnosis not present

## 2014-10-31 DIAGNOSIS — M7502 Adhesive capsulitis of left shoulder: Secondary | ICD-10-CM | POA: Diagnosis not present

## 2014-10-31 DIAGNOSIS — M5032 Other cervical disc degeneration, mid-cervical region: Secondary | ICD-10-CM | POA: Diagnosis not present

## 2014-10-31 DIAGNOSIS — M5031 Other cervical disc degeneration,  high cervical region: Secondary | ICD-10-CM | POA: Diagnosis not present

## 2014-11-02 DIAGNOSIS — M159 Polyosteoarthritis, unspecified: Secondary | ICD-10-CM | POA: Diagnosis not present

## 2014-11-02 DIAGNOSIS — M7502 Adhesive capsulitis of left shoulder: Secondary | ICD-10-CM | POA: Diagnosis not present

## 2014-11-02 DIAGNOSIS — M5031 Other cervical disc degeneration,  high cervical region: Secondary | ICD-10-CM | POA: Diagnosis not present

## 2014-11-02 DIAGNOSIS — M5032 Other cervical disc degeneration, mid-cervical region: Secondary | ICD-10-CM | POA: Diagnosis not present

## 2014-11-02 DIAGNOSIS — I10 Essential (primary) hypertension: Secondary | ICD-10-CM | POA: Diagnosis not present

## 2014-11-02 DIAGNOSIS — E119 Type 2 diabetes mellitus without complications: Secondary | ICD-10-CM | POA: Diagnosis not present

## 2014-11-03 ENCOUNTER — Ambulatory Visit (INDEPENDENT_AMBULATORY_CARE_PROVIDER_SITE_OTHER): Payer: Medicare Other | Admitting: Internal Medicine

## 2014-11-03 ENCOUNTER — Other Ambulatory Visit (HOSPITAL_COMMUNITY)
Admission: RE | Admit: 2014-11-03 | Discharge: 2014-11-03 | Disposition: A | Discharge status: Home / Self Care | Payer: Medicare Other | Source: Ambulatory Visit | Attending: Internal Medicine | Admitting: Internal Medicine

## 2014-11-03 ENCOUNTER — Encounter: Payer: Self-pay | Admitting: Internal Medicine

## 2014-11-03 VITALS — BP 130/75 | HR 81 | Temp 98.2°F | Wt 208.4 lb

## 2014-11-03 DIAGNOSIS — I10 Essential (primary) hypertension: Secondary | ICD-10-CM | POA: Diagnosis not present

## 2014-11-03 DIAGNOSIS — R634 Abnormal weight loss: Secondary | ICD-10-CM

## 2014-11-03 DIAGNOSIS — Z124 Encounter for screening for malignant neoplasm of cervix: Secondary | ICD-10-CM | POA: Diagnosis not present

## 2014-11-03 DIAGNOSIS — Z1151 Encounter for screening for human papillomavirus (HPV): Secondary | ICD-10-CM | POA: Insufficient documentation

## 2014-11-03 NOTE — Progress Notes (Signed)
   Subjective:    Patient ID: Julie Elliott, female    DOB: October 14, 1955, 59 y.o.   MRN: [id number]  HPI Julie Elliott is a 59yo woman with PMHx of HTN, systolic CHF, GERD, Type 2 DM, and hyperlipidemia who presents today for the following:  1. HTN: BP 130/75 today. She is on Enalapril 20 mg BID and Metoprolol 50 mg daily. Lasix was discontinued at last visit as her BP was on low normal side and she has never appeared volume overloaded on exam. She denies any leg swelling.   2. Unintentional Weight Loss: Weight today is 208. Her weight has increased from 201 lbs on 10/19/14. She states she has not done anything differently and is eating the same amount. She denies diarrhea. Overall she is still down ~ 60 lbs since Nov 2015. LFTs checked at last visit were normal. Her mammogram is scheduled for 11/13/14. Will get pap smear today.    Review of Systems General: Denies fever, chills, night sweats, changes in appetite HEENT: Denies headaches, ear pain, changes in vision, rhinorrhea, sore throat CV: Denies CP, palpitations, SOB, orthopnea Pulm: Denies SOB, cough, wheezing GI: Denies abdominal pain, nausea, vomiting, diarrhea, constipation, melena, hematochezia GU: Denies dysuria, hematuria, frequency Msk: Denies muscle cramps, joint pains Neuro: Denies weakness, numbness, tingling Skin: Denies rashes, bruising    Objective:   Physical Exam General: sitting up in chair, pleasant, NAD HEENT: [place]/AT, EOMI, mucus membranes moist CV: RRR, no m/g/r  Pulm: CTA bilaterally, breaths non-labored  Abd: BS+, soft, non-tender  Pelvic: external genitalia appear normal, no ulcers or lesions present. Cervix is pink, no masses or lesions present. On bimanual exam, no masses palpated.  Ext: warm, no edema Neuro: alert and oriented x 3, no focal deficits     Assessment & Plan:

## 2014-11-03 NOTE — Assessment & Plan Note (Signed)
BP Readings from Last 3 Encounters:  11/03/14 130/75  10/17/14 102/65  09/12/14 103/65    Lab Results  Component Value Date   NA 142 09/12/2014   K 3.6 09/12/2014   CREATININE 0.68 09/12/2014    Assessment: Blood pressure control: controlled Progress toward BP goal:  at goal  Plan: Medications:  continue current medications Educational resources provided: brochure Other plans:  Will continue Enalapril 20 mg BID and Metoprolol 50 mg daily. Checking bmet today to assess K since off of Lasix. Will keep off of Lasix since no swelling apparent and instructed to call the clinic if she starts to notice leg swelling.

## 2014-11-03 NOTE — Assessment & Plan Note (Signed)
Weight has increased slightly. LFTs normal from last visit. Pap smear done today. No obvious malignancies on exam. Mammogram scheduled for 11/13/14. - Follow up cytology from pap smear - Follow up mammogram  - TSH, ESR, LDH ordered

## 2014-11-03 NOTE — Patient Instructions (Signed)
It was a pleasure seeing you today, Ms. Rick Duff.   1. Blood pressure - Continue Enalapril and Metoprolol - If you start to see leg swelling please call the clinic - Blood work today  2. Weight loss - Will let you know results of pap smear and mammogram - Blood work today, will let you know results  General Instructions:   Please bring your medicines with you each time you come to clinic.  Medicines may include prescription medications, over-the-counter medications, herbal remedies, eye drops, vitamins, or other pills.   Progress Toward Treatment Goals:  Treatment Goal 10/17/2014  Hemoglobin A1C at goal  Blood pressure at goal  Stop smoking -    Self Care Goals & Plans:  Self Care Goal 11/03/2014  Manage my medications take my medicines as prescribed; bring my medications to every visit; refill my medications on time  Monitor my health -  Eat healthy foods eat foods that are low in salt; eat baked foods instead of fried foods  Be physically active find an activity I enjoy    Home Blood Glucose Monitoring 06/20/2014  Check my blood sugar once a day  When to check my blood sugar -     Care Management & Community Referrals:  Referral 10/17/2014  Referrals made for care management support nutritionist

## 2014-11-04 LAB — BASIC METABOLIC PANEL WITH GFR
BUN: 15 mg/dL (ref 6–23)
CHLORIDE: 110 meq/L (ref 96–112)
CO2: 23 meq/L (ref 19–32)
CREATININE: 0.53 mg/dL (ref 0.50–1.10)
Calcium: 9.1 mg/dL (ref 8.4–10.5)
GFR, Est African American: >89 mL/min
GFR, Est Non African American: >89 mL/min
Glucose, Bld: 99 mg/dL (ref 70–99)
Potassium: 3.9 mEq/L (ref 3.5–5.3)
SODIUM: 143 meq/L (ref 135–145)

## 2014-11-04 LAB — LACTATE DEHYDROGENASE: LDH: 125 U/L (ref 94–250)

## 2014-11-04 LAB — SEDIMENTATION RATE: SED RATE: 11 mm/h (ref 0–30)

## 2014-11-04 LAB — TSH: TSH: 0.729 u[IU]/mL (ref 0.350–4.500)

## 2014-11-06 ENCOUNTER — Telehealth: Payer: Self-pay | Admitting: *Deleted

## 2014-11-06 NOTE — Telephone Encounter (Signed)
Randall Hiss with [facility] [phone] called to get VO for OT and extension on home therapy. Hilda Blades Deniyah Dillavou RN 11/06/14 1:55PM

## 2014-11-06 NOTE — Addendum Note (Signed)
Addended by: Gilles Chiquito B on: 11/06/2014 11:29 AM   Modules accepted: Level of Service

## 2014-11-06 NOTE — Progress Notes (Signed)
Internal Medicine Clinic Attending  Case discussed with Dr. Rivet at the time of the visit.  We reviewed the resident's history and exam and pertinent patient test results.  I agree with the assessment, diagnosis, and plan of care documented in the resident's note.  

## 2014-11-07 LAB — CYTOLOGY - PAP

## 2014-11-11 DIAGNOSIS — M5031 Other cervical disc degeneration,  high cervical region: Secondary | ICD-10-CM | POA: Diagnosis not present

## 2014-11-11 DIAGNOSIS — I10 Essential (primary) hypertension: Secondary | ICD-10-CM | POA: Diagnosis not present

## 2014-11-11 DIAGNOSIS — M159 Polyosteoarthritis, unspecified: Secondary | ICD-10-CM | POA: Diagnosis not present

## 2014-11-11 DIAGNOSIS — M5032 Other cervical disc degeneration, mid-cervical region: Secondary | ICD-10-CM | POA: Diagnosis not present

## 2014-11-11 DIAGNOSIS — M7502 Adhesive capsulitis of left shoulder: Secondary | ICD-10-CM | POA: Diagnosis not present

## 2014-11-11 DIAGNOSIS — E119 Type 2 diabetes mellitus without complications: Secondary | ICD-10-CM | POA: Diagnosis not present

## 2014-11-13 ENCOUNTER — Ambulatory Visit (HOSPITAL_COMMUNITY)
Admission: RE | Admit: 2014-11-13 | Discharge: 2014-11-13 | Disposition: A | Discharge status: Home / Self Care | Payer: Medicare Other | Source: Ambulatory Visit | Attending: Internal Medicine | Admitting: Internal Medicine

## 2014-11-13 ENCOUNTER — Other Ambulatory Visit: Payer: Self-pay | Admitting: Internal Medicine

## 2014-11-13 DIAGNOSIS — Z1231 Encounter for screening mammogram for malignant neoplasm of breast: Secondary | ICD-10-CM

## 2014-11-13 NOTE — Telephone Encounter (Signed)
Addy for OT and extension on home therapy. Thanks.

## 2014-11-13 NOTE — Telephone Encounter (Signed)
Pt saw Dr Arcelia Jew in clinic 11/03/14.

## 2014-11-13 NOTE — Telephone Encounter (Signed)
Talked with Randall Hiss - pt was recently discharged from home care - will talk  to supervisor about verbal order.

## 2014-11-15 DIAGNOSIS — G5602 Carpal tunnel syndrome, left upper limb: Secondary | ICD-10-CM | POA: Diagnosis not present

## 2014-11-16 DIAGNOSIS — M159 Polyosteoarthritis, unspecified: Secondary | ICD-10-CM | POA: Diagnosis not present

## 2014-11-16 DIAGNOSIS — M5032 Other cervical disc degeneration, mid-cervical region: Secondary | ICD-10-CM | POA: Diagnosis not present

## 2014-11-16 DIAGNOSIS — I10 Essential (primary) hypertension: Secondary | ICD-10-CM | POA: Diagnosis not present

## 2014-11-16 DIAGNOSIS — E119 Type 2 diabetes mellitus without complications: Secondary | ICD-10-CM | POA: Diagnosis not present

## 2014-11-16 DIAGNOSIS — M7502 Adhesive capsulitis of left shoulder: Secondary | ICD-10-CM | POA: Diagnosis not present

## 2014-11-16 DIAGNOSIS — M5031 Other cervical disc degeneration,  high cervical region: Secondary | ICD-10-CM | POA: Diagnosis not present

## 2014-11-21 DIAGNOSIS — M5032 Other cervical disc degeneration, mid-cervical region: Secondary | ICD-10-CM | POA: Diagnosis not present

## 2014-11-21 DIAGNOSIS — M5031 Other cervical disc degeneration,  high cervical region: Secondary | ICD-10-CM | POA: Diagnosis not present

## 2014-11-21 DIAGNOSIS — M7502 Adhesive capsulitis of left shoulder: Secondary | ICD-10-CM | POA: Diagnosis not present

## 2014-11-21 DIAGNOSIS — I10 Essential (primary) hypertension: Secondary | ICD-10-CM | POA: Diagnosis not present

## 2014-11-21 DIAGNOSIS — M159 Polyosteoarthritis, unspecified: Secondary | ICD-10-CM | POA: Diagnosis not present

## 2014-11-21 DIAGNOSIS — E119 Type 2 diabetes mellitus without complications: Secondary | ICD-10-CM | POA: Diagnosis not present

## 2014-11-22 ENCOUNTER — Inpatient Hospital Stay: Admit: 2014-11-22 | Primary: Registered Nurse

## 2014-11-22 DIAGNOSIS — M79642 Pain in left hand: Secondary | ICD-10-CM | POA: Diagnosis not present

## 2014-11-22 LAB — SENTARA SPECIMEN COLLN.

## 2014-11-23 ENCOUNTER — Telehealth: Payer: Self-pay | Admitting: *Deleted

## 2014-11-23 DIAGNOSIS — I10 Essential (primary) hypertension: Secondary | ICD-10-CM | POA: Diagnosis not present

## 2014-11-23 DIAGNOSIS — E119 Type 2 diabetes mellitus without complications: Secondary | ICD-10-CM | POA: Diagnosis not present

## 2014-11-23 DIAGNOSIS — M5031 Other cervical disc degeneration,  high cervical region: Secondary | ICD-10-CM | POA: Diagnosis not present

## 2014-11-23 DIAGNOSIS — M5032 Other cervical disc degeneration, mid-cervical region: Secondary | ICD-10-CM | POA: Diagnosis not present

## 2014-11-23 DIAGNOSIS — M159 Polyosteoarthritis, unspecified: Secondary | ICD-10-CM | POA: Diagnosis not present

## 2014-11-23 DIAGNOSIS — M7502 Adhesive capsulitis of left shoulder: Secondary | ICD-10-CM | POA: Diagnosis not present

## 2014-11-23 NOTE — Telephone Encounter (Signed)
I approve all- home OT, home health aide, and 3 in 1 commode and transfer tub bench,

## 2014-11-23 NOTE — Telephone Encounter (Signed)
Call from Izora Gala, Occupational Therapist with [facility]. [phone]  Therapist is asking for Verbal Order for [facility] 2 times a week for 7 weeks, 1 time a week for 1 week. :to address upper extremity deficit  and ADL deficits. Also wants to add a home health aide to, plan of care,  2 times a week for 6 weeks to help with bathing and dressing. Also wants Rx faxed to office for 3 in 1 commode and transfer tub bench .  Fax # [phone]   Att: brittani  Please advise

## 2014-11-24 ENCOUNTER — Telehealth: Payer: Self-pay | Admitting: Internal Medicine

## 2014-11-24 ENCOUNTER — Other Ambulatory Visit: Payer: Self-pay | Admitting: Internal Medicine

## 2014-11-24 DIAGNOSIS — M5032 Other cervical disc degeneration, mid-cervical region: Secondary | ICD-10-CM | POA: Diagnosis not present

## 2014-11-24 DIAGNOSIS — R2681 Unsteadiness on feet: Secondary | ICD-10-CM

## 2014-11-24 DIAGNOSIS — M7502 Adhesive capsulitis of left shoulder: Secondary | ICD-10-CM | POA: Diagnosis not present

## 2014-11-24 DIAGNOSIS — I10 Essential (primary) hypertension: Secondary | ICD-10-CM | POA: Diagnosis not present

## 2014-11-24 DIAGNOSIS — E119 Type 2 diabetes mellitus without complications: Secondary | ICD-10-CM | POA: Diagnosis not present

## 2014-11-24 DIAGNOSIS — M159 Polyosteoarthritis, unspecified: Secondary | ICD-10-CM | POA: Diagnosis not present

## 2014-11-24 DIAGNOSIS — M5031 Other cervical disc degeneration,  high cervical region: Secondary | ICD-10-CM | POA: Diagnosis not present

## 2014-11-24 NOTE — Telephone Encounter (Signed)
Called HH and gave the order. I told Izora Gala we would enter a DME order and send to [facility] for the bench and commode.    Dr Arcelia Jew - please enter a DME order for the 3 in 1 commode and transfer tub bench.

## 2014-11-27 DIAGNOSIS — M5031 Other cervical disc degeneration,  high cervical region: Secondary | ICD-10-CM | POA: Diagnosis not present

## 2014-11-27 DIAGNOSIS — M7502 Adhesive capsulitis of left shoulder: Secondary | ICD-10-CM | POA: Diagnosis not present

## 2014-11-27 DIAGNOSIS — I10 Essential (primary) hypertension: Secondary | ICD-10-CM | POA: Diagnosis not present

## 2014-11-27 DIAGNOSIS — M5032 Other cervical disc degeneration, mid-cervical region: Secondary | ICD-10-CM | POA: Diagnosis not present

## 2014-11-27 DIAGNOSIS — M159 Polyosteoarthritis, unspecified: Secondary | ICD-10-CM | POA: Diagnosis not present

## 2014-11-27 DIAGNOSIS — E119 Type 2 diabetes mellitus without complications: Secondary | ICD-10-CM | POA: Diagnosis not present

## 2014-11-28 ENCOUNTER — Telehealth: Payer: Self-pay | Admitting: *Deleted

## 2014-11-28 DIAGNOSIS — M5031 Other cervical disc degeneration,  high cervical region: Secondary | ICD-10-CM | POA: Diagnosis not present

## 2014-11-28 DIAGNOSIS — E119 Type 2 diabetes mellitus without complications: Secondary | ICD-10-CM | POA: Diagnosis not present

## 2014-11-28 DIAGNOSIS — M5032 Other cervical disc degeneration, mid-cervical region: Secondary | ICD-10-CM | POA: Diagnosis not present

## 2014-11-28 DIAGNOSIS — M159 Polyosteoarthritis, unspecified: Secondary | ICD-10-CM | POA: Diagnosis not present

## 2014-11-28 DIAGNOSIS — M7502 Adhesive capsulitis of left shoulder: Secondary | ICD-10-CM | POA: Diagnosis not present

## 2014-11-28 DIAGNOSIS — I10 Essential (primary) hypertension: Secondary | ICD-10-CM | POA: Diagnosis not present

## 2014-11-28 NOTE — Telephone Encounter (Signed)
Talked with pt - has rash under breast, left arm and vag area. Pt states she gets when temp gets warm. Pt states no broken skin. Offered an appt - prefers no appt at this time. States CBG are OK. Suggest to use cornstartch to area - no powder products. Needs an appt if no change. Hilda Blades Erryn Dickison RN 11/28/14 3PM

## 2014-11-29 DIAGNOSIS — E119 Type 2 diabetes mellitus without complications: Secondary | ICD-10-CM | POA: Diagnosis not present

## 2014-11-29 DIAGNOSIS — M5031 Other cervical disc degeneration,  high cervical region: Secondary | ICD-10-CM | POA: Diagnosis not present

## 2014-11-29 DIAGNOSIS — I10 Essential (primary) hypertension: Secondary | ICD-10-CM | POA: Diagnosis not present

## 2014-11-29 DIAGNOSIS — M159 Polyosteoarthritis, unspecified: Secondary | ICD-10-CM | POA: Diagnosis not present

## 2014-11-29 DIAGNOSIS — M7502 Adhesive capsulitis of left shoulder: Secondary | ICD-10-CM | POA: Diagnosis not present

## 2014-11-29 DIAGNOSIS — M5032 Other cervical disc degeneration, mid-cervical region: Secondary | ICD-10-CM | POA: Diagnosis not present

## 2014-11-30 DIAGNOSIS — M159 Polyosteoarthritis, unspecified: Secondary | ICD-10-CM | POA: Diagnosis not present

## 2014-11-30 DIAGNOSIS — M7502 Adhesive capsulitis of left shoulder: Secondary | ICD-10-CM | POA: Diagnosis not present

## 2014-11-30 DIAGNOSIS — M5031 Other cervical disc degeneration,  high cervical region: Secondary | ICD-10-CM | POA: Diagnosis not present

## 2014-11-30 DIAGNOSIS — M5032 Other cervical disc degeneration, mid-cervical region: Secondary | ICD-10-CM | POA: Diagnosis not present

## 2014-11-30 DIAGNOSIS — E119 Type 2 diabetes mellitus without complications: Secondary | ICD-10-CM | POA: Diagnosis not present

## 2014-11-30 DIAGNOSIS — I10 Essential (primary) hypertension: Secondary | ICD-10-CM | POA: Diagnosis not present

## 2014-12-01 ENCOUNTER — Telehealth: Payer: Self-pay | Admitting: Internal Medicine

## 2014-12-01 DIAGNOSIS — M25642 Stiffness of left hand, not elsewhere classified: Secondary | ICD-10-CM

## 2014-12-01 DIAGNOSIS — M159 Polyosteoarthritis, unspecified: Secondary | ICD-10-CM | POA: Diagnosis not present

## 2014-12-01 DIAGNOSIS — M5032 Other cervical disc degeneration, mid-cervical region: Secondary | ICD-10-CM | POA: Diagnosis not present

## 2014-12-01 DIAGNOSIS — M5031 Other cervical disc degeneration,  high cervical region: Secondary | ICD-10-CM | POA: Diagnosis not present

## 2014-12-01 DIAGNOSIS — I10 Essential (primary) hypertension: Secondary | ICD-10-CM | POA: Diagnosis not present

## 2014-12-01 DIAGNOSIS — M7502 Adhesive capsulitis of left shoulder: Secondary | ICD-10-CM | POA: Diagnosis not present

## 2014-12-01 DIAGNOSIS — E119 Type 2 diabetes mellitus without complications: Secondary | ICD-10-CM | POA: Diagnosis not present

## 2014-12-01 NOTE — Telephone Encounter (Signed)
Spoke with patient and she is wanting a referral to neurology for her bilateral hand pain. She states her left hand is worse and she is having cramping/stiffening of this hand. She has seen sports medicine and ortho-hand who believe she may have carpal tunnel, but would like a neurologist's opinion. Will place referral to neurology.   I also spoke with her home occupational therapist and they would like for her to have her home aide come 3 times weekly instead of two. I gave a verbal order to approve this. Paperwork will be faxed for me to complete.

## 2014-12-04 DIAGNOSIS — I10 Essential (primary) hypertension: Secondary | ICD-10-CM | POA: Diagnosis not present

## 2014-12-04 DIAGNOSIS — M5031 Other cervical disc degeneration,  high cervical region: Secondary | ICD-10-CM | POA: Diagnosis not present

## 2014-12-04 DIAGNOSIS — M159 Polyosteoarthritis, unspecified: Secondary | ICD-10-CM | POA: Diagnosis not present

## 2014-12-04 DIAGNOSIS — M5032 Other cervical disc degeneration, mid-cervical region: Secondary | ICD-10-CM | POA: Diagnosis not present

## 2014-12-04 DIAGNOSIS — M7502 Adhesive capsulitis of left shoulder: Secondary | ICD-10-CM | POA: Diagnosis not present

## 2014-12-04 DIAGNOSIS — E119 Type 2 diabetes mellitus without complications: Secondary | ICD-10-CM | POA: Diagnosis not present

## 2014-12-05 DIAGNOSIS — M7502 Adhesive capsulitis of left shoulder: Secondary | ICD-10-CM | POA: Diagnosis not present

## 2014-12-05 DIAGNOSIS — I10 Essential (primary) hypertension: Secondary | ICD-10-CM | POA: Diagnosis not present

## 2014-12-05 DIAGNOSIS — E119 Type 2 diabetes mellitus without complications: Secondary | ICD-10-CM | POA: Diagnosis not present

## 2014-12-05 DIAGNOSIS — M5031 Other cervical disc degeneration,  high cervical region: Secondary | ICD-10-CM | POA: Diagnosis not present

## 2014-12-05 DIAGNOSIS — M159 Polyosteoarthritis, unspecified: Secondary | ICD-10-CM | POA: Diagnosis not present

## 2014-12-05 DIAGNOSIS — M5032 Other cervical disc degeneration, mid-cervical region: Secondary | ICD-10-CM | POA: Diagnosis not present

## 2014-12-06 DIAGNOSIS — M159 Polyosteoarthritis, unspecified: Secondary | ICD-10-CM | POA: Diagnosis not present

## 2014-12-06 DIAGNOSIS — E119 Type 2 diabetes mellitus without complications: Secondary | ICD-10-CM | POA: Diagnosis not present

## 2014-12-06 DIAGNOSIS — M5032 Other cervical disc degeneration, mid-cervical region: Secondary | ICD-10-CM | POA: Diagnosis not present

## 2014-12-06 DIAGNOSIS — M5031 Other cervical disc degeneration,  high cervical region: Secondary | ICD-10-CM | POA: Diagnosis not present

## 2014-12-06 DIAGNOSIS — M7502 Adhesive capsulitis of left shoulder: Secondary | ICD-10-CM | POA: Diagnosis not present

## 2014-12-06 DIAGNOSIS — I10 Essential (primary) hypertension: Secondary | ICD-10-CM | POA: Diagnosis not present

## 2014-12-07 DIAGNOSIS — I10 Essential (primary) hypertension: Secondary | ICD-10-CM | POA: Diagnosis not present

## 2014-12-07 DIAGNOSIS — M159 Polyosteoarthritis, unspecified: Secondary | ICD-10-CM | POA: Diagnosis not present

## 2014-12-07 DIAGNOSIS — M5031 Other cervical disc degeneration,  high cervical region: Secondary | ICD-10-CM | POA: Diagnosis not present

## 2014-12-07 DIAGNOSIS — E119 Type 2 diabetes mellitus without complications: Secondary | ICD-10-CM | POA: Diagnosis not present

## 2014-12-07 DIAGNOSIS — M5032 Other cervical disc degeneration, mid-cervical region: Secondary | ICD-10-CM | POA: Diagnosis not present

## 2014-12-07 DIAGNOSIS — M7502 Adhesive capsulitis of left shoulder: Secondary | ICD-10-CM | POA: Diagnosis not present

## 2014-12-08 DIAGNOSIS — M159 Polyosteoarthritis, unspecified: Secondary | ICD-10-CM | POA: Diagnosis not present

## 2014-12-08 DIAGNOSIS — I10 Essential (primary) hypertension: Secondary | ICD-10-CM | POA: Diagnosis not present

## 2014-12-08 DIAGNOSIS — M7502 Adhesive capsulitis of left shoulder: Secondary | ICD-10-CM | POA: Diagnosis not present

## 2014-12-08 DIAGNOSIS — M5032 Other cervical disc degeneration, mid-cervical region: Secondary | ICD-10-CM | POA: Diagnosis not present

## 2014-12-08 DIAGNOSIS — E119 Type 2 diabetes mellitus without complications: Secondary | ICD-10-CM | POA: Diagnosis not present

## 2014-12-08 DIAGNOSIS — M5031 Other cervical disc degeneration,  high cervical region: Secondary | ICD-10-CM | POA: Diagnosis not present

## 2014-12-11 DIAGNOSIS — I10 Essential (primary) hypertension: Secondary | ICD-10-CM | POA: Diagnosis not present

## 2014-12-11 DIAGNOSIS — M7502 Adhesive capsulitis of left shoulder: Secondary | ICD-10-CM | POA: Diagnosis not present

## 2014-12-11 DIAGNOSIS — M5032 Other cervical disc degeneration, mid-cervical region: Secondary | ICD-10-CM | POA: Diagnosis not present

## 2014-12-11 DIAGNOSIS — M159 Polyosteoarthritis, unspecified: Secondary | ICD-10-CM | POA: Diagnosis not present

## 2014-12-11 DIAGNOSIS — M5031 Other cervical disc degeneration,  high cervical region: Secondary | ICD-10-CM | POA: Diagnosis not present

## 2014-12-11 DIAGNOSIS — E119 Type 2 diabetes mellitus without complications: Secondary | ICD-10-CM | POA: Diagnosis not present

## 2014-12-12 DIAGNOSIS — M5031 Other cervical disc degeneration,  high cervical region: Secondary | ICD-10-CM | POA: Diagnosis not present

## 2014-12-12 DIAGNOSIS — E119 Type 2 diabetes mellitus without complications: Secondary | ICD-10-CM | POA: Diagnosis not present

## 2014-12-12 DIAGNOSIS — M5032 Other cervical disc degeneration, mid-cervical region: Secondary | ICD-10-CM | POA: Diagnosis not present

## 2014-12-12 DIAGNOSIS — I10 Essential (primary) hypertension: Secondary | ICD-10-CM | POA: Diagnosis not present

## 2014-12-12 DIAGNOSIS — M159 Polyosteoarthritis, unspecified: Secondary | ICD-10-CM | POA: Diagnosis not present

## 2014-12-12 DIAGNOSIS — M7502 Adhesive capsulitis of left shoulder: Secondary | ICD-10-CM | POA: Diagnosis not present

## 2014-12-13 DIAGNOSIS — I10 Essential (primary) hypertension: Secondary | ICD-10-CM | POA: Diagnosis not present

## 2014-12-13 DIAGNOSIS — M5032 Other cervical disc degeneration, mid-cervical region: Secondary | ICD-10-CM | POA: Diagnosis not present

## 2014-12-13 DIAGNOSIS — M7502 Adhesive capsulitis of left shoulder: Secondary | ICD-10-CM | POA: Diagnosis not present

## 2014-12-13 DIAGNOSIS — M5031 Other cervical disc degeneration,  high cervical region: Secondary | ICD-10-CM | POA: Diagnosis not present

## 2014-12-13 DIAGNOSIS — E119 Type 2 diabetes mellitus without complications: Secondary | ICD-10-CM | POA: Diagnosis not present

## 2014-12-13 DIAGNOSIS — M159 Polyosteoarthritis, unspecified: Secondary | ICD-10-CM | POA: Diagnosis not present

## 2014-12-14 DIAGNOSIS — M159 Polyosteoarthritis, unspecified: Secondary | ICD-10-CM | POA: Diagnosis not present

## 2014-12-14 DIAGNOSIS — I10 Essential (primary) hypertension: Secondary | ICD-10-CM | POA: Diagnosis not present

## 2014-12-14 DIAGNOSIS — E119 Type 2 diabetes mellitus without complications: Secondary | ICD-10-CM | POA: Diagnosis not present

## 2014-12-14 DIAGNOSIS — M5031 Other cervical disc degeneration,  high cervical region: Secondary | ICD-10-CM | POA: Diagnosis not present

## 2014-12-14 DIAGNOSIS — M7502 Adhesive capsulitis of left shoulder: Secondary | ICD-10-CM | POA: Diagnosis not present

## 2014-12-14 DIAGNOSIS — M5032 Other cervical disc degeneration, mid-cervical region: Secondary | ICD-10-CM | POA: Diagnosis not present

## 2014-12-15 DIAGNOSIS — I10 Essential (primary) hypertension: Secondary | ICD-10-CM | POA: Diagnosis not present

## 2014-12-15 DIAGNOSIS — M159 Polyosteoarthritis, unspecified: Secondary | ICD-10-CM | POA: Diagnosis not present

## 2014-12-15 DIAGNOSIS — M5031 Other cervical disc degeneration,  high cervical region: Secondary | ICD-10-CM | POA: Diagnosis not present

## 2014-12-15 DIAGNOSIS — M7502 Adhesive capsulitis of left shoulder: Secondary | ICD-10-CM | POA: Diagnosis not present

## 2014-12-15 DIAGNOSIS — E119 Type 2 diabetes mellitus without complications: Secondary | ICD-10-CM | POA: Diagnosis not present

## 2014-12-15 DIAGNOSIS — M5032 Other cervical disc degeneration, mid-cervical region: Secondary | ICD-10-CM | POA: Diagnosis not present

## 2014-12-19 DIAGNOSIS — M5032 Other cervical disc degeneration, mid-cervical region: Secondary | ICD-10-CM | POA: Diagnosis not present

## 2014-12-19 DIAGNOSIS — I10 Essential (primary) hypertension: Secondary | ICD-10-CM | POA: Diagnosis not present

## 2014-12-19 DIAGNOSIS — M5031 Other cervical disc degeneration,  high cervical region: Secondary | ICD-10-CM | POA: Diagnosis not present

## 2014-12-19 DIAGNOSIS — M159 Polyosteoarthritis, unspecified: Secondary | ICD-10-CM | POA: Diagnosis not present

## 2014-12-19 DIAGNOSIS — M7502 Adhesive capsulitis of left shoulder: Secondary | ICD-10-CM | POA: Diagnosis not present

## 2014-12-19 DIAGNOSIS — E119 Type 2 diabetes mellitus without complications: Secondary | ICD-10-CM | POA: Diagnosis not present

## 2014-12-21 ENCOUNTER — Encounter: Payer: Self-pay | Admitting: Neurology

## 2014-12-21 ENCOUNTER — Encounter: Payer: Self-pay | Admitting: *Deleted

## 2014-12-21 ENCOUNTER — Telehealth: Payer: Self-pay | Admitting: *Deleted

## 2014-12-21 ENCOUNTER — Ambulatory Visit (INDEPENDENT_AMBULATORY_CARE_PROVIDER_SITE_OTHER): Payer: Medicare Other | Admitting: Neurology

## 2014-12-21 VITALS — BP 124/83 | HR 119 | Ht 63.5 in | Wt 194.2 lb

## 2014-12-21 DIAGNOSIS — E519 Thiamine deficiency, unspecified: Secondary | ICD-10-CM | POA: Diagnosis not present

## 2014-12-21 DIAGNOSIS — R292 Abnormal reflex: Secondary | ICD-10-CM | POA: Diagnosis not present

## 2014-12-21 DIAGNOSIS — G1221 Amyotrophic lateral sclerosis: Secondary | ICD-10-CM | POA: Diagnosis not present

## 2014-12-21 DIAGNOSIS — L959 Vasculitis limited to the skin, unspecified: Secondary | ICD-10-CM

## 2014-12-21 DIAGNOSIS — E538 Deficiency of other specified B group vitamins: Secondary | ICD-10-CM | POA: Diagnosis not present

## 2014-12-21 DIAGNOSIS — E531 Pyridoxine deficiency: Secondary | ICD-10-CM | POA: Diagnosis not present

## 2014-12-21 DIAGNOSIS — G63 Polyneuropathy in diseases classified elsewhere: Secondary | ICD-10-CM | POA: Diagnosis not present

## 2014-12-21 DIAGNOSIS — E119 Type 2 diabetes mellitus without complications: Secondary | ICD-10-CM | POA: Diagnosis not present

## 2014-12-21 DIAGNOSIS — M159 Polyosteoarthritis, unspecified: Secondary | ICD-10-CM | POA: Diagnosis not present

## 2014-12-21 DIAGNOSIS — G609 Hereditary and idiopathic neuropathy, unspecified: Secondary | ICD-10-CM | POA: Diagnosis not present

## 2014-12-21 DIAGNOSIS — IMO0002 Reserved for concepts with insufficient information to code with codable children: Secondary | ICD-10-CM

## 2014-12-21 DIAGNOSIS — M7502 Adhesive capsulitis of left shoulder: Secondary | ICD-10-CM | POA: Diagnosis not present

## 2014-12-21 DIAGNOSIS — I10 Essential (primary) hypertension: Secondary | ICD-10-CM | POA: Diagnosis not present

## 2014-12-21 DIAGNOSIS — G9050 Complex regional pain syndrome I, unspecified: Secondary | ICD-10-CM | POA: Diagnosis not present

## 2014-12-21 DIAGNOSIS — M6289 Other specified disorders of muscle: Secondary | ICD-10-CM

## 2014-12-21 DIAGNOSIS — M5031 Other cervical disc degeneration,  high cervical region: Secondary | ICD-10-CM | POA: Diagnosis not present

## 2014-12-21 DIAGNOSIS — R29898 Other symptoms and signs involving the musculoskeletal system: Secondary | ICD-10-CM | POA: Diagnosis not present

## 2014-12-21 DIAGNOSIS — G1229 Other motor neuron disease: Secondary | ICD-10-CM

## 2014-12-21 DIAGNOSIS — B20 Human immunodeficiency virus [HIV] disease: Secondary | ICD-10-CM | POA: Diagnosis not present

## 2014-12-21 DIAGNOSIS — M5032 Other cervical disc degeneration, mid-cervical region: Secondary | ICD-10-CM | POA: Diagnosis not present

## 2014-12-21 DIAGNOSIS — I776 Arteritis, unspecified: Secondary | ICD-10-CM | POA: Diagnosis not present

## 2014-12-21 NOTE — Patient Instructions (Signed)
Gave signed record release form to Milon Dikes in MR to request NCS/EMG results from [facility]; Dr. Normajean Glasgow on 12/21/14.

## 2014-12-21 NOTE — Patient Instructions (Signed)
Overall you are doing fairly well but I do want to suggest a few things today:   Remember to drink plenty of fluid, eat healthy meals and do not skip any meals. Try to eat protein with a every meal and eat a healthy snack such as fruit or nuts in between meals. Try to keep a regular sleep-wake schedule and try to exercise daily, particularly in the form of walking, 20-30 minutes a day, if you can.   As far as diagnostic testing: MRI of the brain, labs  I would like to see you back after workup is complete, sooner if we need to. Please call us with any interim questions, concerns, problems, updates or refill requests.   Please also call us for any test results so we can go over those with you on the phone.  My clinical assistant and will answer any of your questions and relay your messages to me and also relay most of my messages to you.   Our phone number is [phone]. We also have an after hours call service for urgent matters and there is a physician on-call for urgent questions. For any emergencies you know to call 911 or go to the nearest emergency room

## 2014-12-21 NOTE — Telephone Encounter (Signed)
Release faxed to [facility] on 12/21/14

## 2014-12-21 NOTE — Progress Notes (Addendum)
[name]    Provider:  Dr Jaynee Eagles Referring Provider: [name], MD Primary Care Physician:  [name], MD  CC:  Progressive gait ataxia  HPI:  Julie Elliott is a 59 y.o. female here as a referral from Dr. Arcelia Jew for progressive gait ataxia, left hand pain and weakness. Julie Elliott is a 59yo woman with PMHx of HTN, systolic CHF, GERD, Type 2 DM, and hyperlipidemia who presents today for the following:  She is having problems with the left hand. She has joint pain and out of nowhere the hand locked up. Stiffness, arthritic-type pain. Doesn't stop when she sleeps, the pain is continuous all day lone. Progressing and worsening. Left leg is weak. Also burning in the left leg. And coldness in both legs. They were in the ED January first, she felt like she couldn't move. Her legs were cold. She has lost a lot of weight quickly, over 100 pounds in the last 6 months. She eats the same, good appetite. she was told that her symptoms were related to arthritis and diabetic neuropathy. One month later they were in the ED again, decreased mobility and ataxia. Now she needs a cane to walk. Issues with balance and near falls. She has developed a frozen shoulder. Progressive, incapaciting pain 10/10 with difficulty performing daily tasks such as cleaning, toileting, bathing, cooking. Symptoms are mostly on the left side but her right leg is now affected. Assymmetric. Right leg slightly involved. Weakness as well as pain mostly on the left. She gets very hot. The left hand symptoms were acute, the other symptoms have been insidious and have slowly progressed. She has to have someone bathe her now. Balance is poor. She has not fallen but very off balance. No injuries, no inciting factors. She had stoped taking her insulin in December because of her weight loss she no longer needs it.  Son is resent with her and provides much of the information.   EMG/NCS showed severe CTS. Had emg/ncs at [facility]  orthopaedics.   Reviewed notes, labs and imaging from outside physicians, which showed:  DG Lumbar spine (reviewed images and agree with the following) : No evidence for fracture. Loss of disc height is seen at L3-4 and L5-S1 advanced facet osteoarthritis is seen bilaterally in the lower lumbar levels. No worrisome lytic or sclerotic osseous abnormality.  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  DG Cervical Spine: The cervicothoracic junction is partially obscured by osseous and soft tissue overlap on the lateral view. Cervical spine alignment is maintained. Vertebral body heights are preserved. There is disc space narrowing at C4-C5, C5-C6, and C6-C7 with associated endplate spurs. There is bony neural foraminal narrowing at C5-C6 bilaterally. The dens is intact. Posterior elements appear well-aligned. There is no evidence of fracture. No prevertebral soft tissue edema.  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 Bony neural foraminal narrowing at C5-C6. No acute bony abnormality.  DG Hip: There is no evidence of fracture. There is osteoarthritis of both hips with mild joint space narrowing and marginal osteophytes. Sacroiliac joints appear normal. The patient has a tendency towards calcification at tendon insertions.  IMPRESSION: No acute finding. Osteoarthritis of both hips.  DG Left hand complete: Evaluation of the digits is limited due to flexed positioning. No fracture or dislocation. Mild osteoarthritis of the thumb and index finger metacarpal phalangeal joints with joint space narrowing and osteophytes. The joint spaces are otherwise maintained. There are no erosions or periosteal reaction. No  focal soft tissue abnormality.  DG Shoulder: Mild osteoarthritis. No evidence of inflammatory arthropathy or acute bony abnormality.  No fracture or dislocation. The alignment and joint spaces are maintained. There is  mild osteoarthritis at the acromioclavicular joint. There is inferior spurring from the glenoid. No definite abnormal soft tissue calcifications.  IMPRESSION: Acromioclavicular and glenohumeral osteoarthritis. No acute bony abnormality.  She was seen in the ED in February for shoulder pain and limitations in movement. She was referred to Orthopaedics. Per notes, She also has hx of leg pain, knee pain, and hip pain which has been attributed to diabetic neuropathy and arthritis in the past. Has unbalance. CXR hip showed OA of both hips and mild joint space narrowing and osteophytes. SI joint was normal. Lumbar spine, degeneration disc And facet disease onf L3-L4, L5-S1. Dxed with possible frozen shoulder 2/2 to osteoarthritis  complicated by rotator cuff tear vs bursitis   She was seen in the ED in January for bilateral cold lower extremities and worsening of her paresthesias, acute onset of cold feet and burning sensation. Exam noted Patient's legs are cool to the touch. Bilateral ABI was within normal limits. Dxed with likely peripheral neuropathy from her diabetes and possible decreased arterial blood flow to the legs.  HgbA1c 6.8 06/2014   Review of Systems: Patient complains of symptoms per HPI as well as the following symptoms: No fever, no CP, no SOB. Pertinent negatives per HPI. All others negative.   History   Social History  . Marital Status: Single    Spouse Name: N/A  . Number of Children: 3  . Years of Education: 12   Occupational History  . Unemployed    Social History Main Topics  . Smoking status: Former Smoker -- 0.30 packs/day    Types: Cigarettes    Quit date: 04/18/2013  . Smokeless tobacco: Not on file     Comment: Smokes 5-6 cigerettes per day, 12/21/14 smokes rarely under stressful situations  . Alcohol Use: No  . Drug Use: No  . Sexual Activity: Not on file   Other Topics Concern  . Not on file   Social History Narrative   to get supplies nfrom [facility]  Medical per pt request.1--[phone]   Lives at home with son.    Right handed.   Caffeine use: drinks coffee-rare    Family History  Problem Relation Age of Onset  . Diabetes Mother   . Heart disease Mother   . Prostate cancer Father   . Neuropathy Neg Hx     Past Medical History  Diagnosis Date  . Hypertension     well controlled  . DJD (degenerative joint disease)   . HLD (hyperlipidemia) 2008    stable, well controlled  . DM (diabetes mellitus) 2008    stable HgBA1C at 6.5  . GERD (gastroesophageal reflux disease)     well controlled on Omeprazole  . Chronic headaches   . CHF (congestive heart failure)     Past Surgical History  Procedure Laterality Date  . Tubal ligation    . Carpel tunnel Right     20 yrs ago    Current Outpatient Prescriptions  Medication Sig Dispense Refill  . acetaminophen (TYLENOL) 650 MG CR tablet Take 1 tablet (650 mg total) by mouth every 8 (eight) hours as needed for pain. 30 tablet 0  . aspirin 81 MG EC tablet Take 1 tablet (81 mg total) by mouth daily. 90 tablet 1  . B-D ULTRA-FINE 33 LANCETS MISC Use  for checking blood sugar, 3 times per day. Diagnosis Code is 250.00 100 each 11  . Calcium-Vitamin D 600-125 MG-UNIT TABS Take 1 tablet by mouth daily. 90 each 1  . diclofenac sodium (VOLTAREN) 1 % GEL Apply 2 g topically 4 (four) times daily. 100 g 0  . enalapril (VASOTEC) 20 MG tablet Take 1 tablet (20 mg total) by mouth 2 (two) times daily. 180 tablet 3  . glucose blood (BAYER CONTOUR TEST) test strip [employer]. Use to test blood sugar 3 times a day. 100 each 11  . metFORMIN (GLUCOPHAGE) 500 MG tablet TAKE 1 TABLET ONCE A DAY WITH BREAKFAST 30 tablet 5  . metoprolol succinate (TOPROL-XL) 50 MG 24 hr tablet Take 1 tablet (50 mg total) by mouth daily. 90 tablet 3  . naproxen (NAPROSYN) 500 MG tablet Take 1 tablet (500 mg total) by mouth 2 (two) times daily. 180 tablet 3  . omeprazole (PRILOSEC) 20 MG capsule TAKE (1) CAPSULE TWICE DAILY. 180  capsule 0  . potassium chloride SA (K-DUR,KLOR-CON) 20 MEQ tablet Take 1 tablet (20 mEq total) by mouth daily. 90 tablet 3  . simvastatin (ZOCOR) 40 MG tablet Take 1 tablet (40 mg total) by mouth daily. 90 tablet 3   No current facility-administered medications for this visit.    Allergies as of 12/21/2014 - Review Complete 12/21/2014  Allergen Reaction Noted  . Loratadine-pseudoephedrine er      Vitals: BP 124/83 mmHg  Pulse 119  Ht 5' 3.5" (1.613 m)  Wt 194 lb 3.2 oz (88.089 kg)  BMI 33.86 kg/m2 Last Weight:  Wt Readings from Last 1 Encounters:  12/21/14 194 lb 3.2 oz (88.089 kg)   Last Height:   Ht Readings from Last 1 Encounters:  12/21/14 5' 3.5" (1.613 m)   Physical exam: Exam: Gen: NAD, conversant, well nourised, obese, well groomed                     CV: RRR, no MRG. No Carotid Bruits. No peripheral edema, warm, nontender Eyes: Conjunctivae clear without exudates or hemorrhage  Neuro: Detailed Neurologic Exam  Speech:    Speech is normal; fluent and spontaneous with normal comprehension.  Cognition:    The patient is oriented to person, place, and time;     recent and remote memory intact;     language fluent;     normal attention, concentration,     fund of knowledge Cranial Nerves:    The pupils are equal, round, and reactive to light. The fundi are flat. Visual fields are full to finger confrontation. Extraocular movements are intact. Trigeminal sensation is intact and the muscles of mastication are normal. The face is symmetric. The palate elevates in the midline. Hearing intact. Voice is normal. Shoulder shrug is normal. The tongue has normal motion without fasciculations.   Coordination:    No dysmetria with right arm and legs.   Gait:    Wide based, slow  Motor Observation: Decreased ROM left shoulder Swelling in the left hand, tender to touch Held in finger flexion at pip and dip Discoloration left hand, shiny skin Contracture at left elbow  (chronic)  Tone:   Increased in the hand vs patient protecting   Increased left leg > right leg  Posture:    Posture is mildly stooped    Strength:    Left > right hip flexion weakness, left arm prox weakness vs. Shoulder pain     Sensation: right leg decreased pp to mid  calf Left leg decreased pp to the ankle Vibration 3 seconds on the left and 5 second on the right at thegreat toes Proprioception intact bilaterally Decreased temp to below knees bilaterally      Reflex Exam:  DTR's: Brick biceps, patellars and achilles for age and medical conditions       Toes:    Left toe upgoing  Clonus:    Clonus is absent.    Assessment/Plan:  Ms. Vandivier is a 59 yo woman with PMHx of HTN, systolic CHF, GERD, Type 2 DM, and hyperlipidemia with 4-5 months of progressive weakness, left hand pain and paresthesias, unintentional weight loss, ataxia. Exam with decreased sensation distally, weakness, increased tone in the lower extremities with left upgoing toe, very brisk reflexes for age and medical conditions. Concerning for upper motor neuron lesion. 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   MRI of the brain w/wo contrast Labwork today Will request EMG/NCS records Continue gabapentin for pain  Addendum: EMG nerve conduction results received from [facility] and sports medicine showed normal ulnar motor and sensory, absent median nerve sensory, delayed latency of median motor with low amplitude. Notes also show 2+ spontaneous activity in the left abductor pollicis brevis. Suggesting carpal tunnel syndrome of the left wrist  Sarina Ill, MD  [facility] [address] [place], [place] [address]  Phone [phone] Fax [phone]

## 2014-12-22 ENCOUNTER — Telehealth: Payer: Self-pay | Admitting: *Deleted

## 2014-12-22 DIAGNOSIS — E119 Type 2 diabetes mellitus without complications: Secondary | ICD-10-CM | POA: Diagnosis not present

## 2014-12-22 DIAGNOSIS — M159 Polyosteoarthritis, unspecified: Secondary | ICD-10-CM | POA: Diagnosis not present

## 2014-12-22 DIAGNOSIS — M7502 Adhesive capsulitis of left shoulder: Secondary | ICD-10-CM | POA: Diagnosis not present

## 2014-12-22 DIAGNOSIS — M5032 Other cervical disc degeneration, mid-cervical region: Secondary | ICD-10-CM | POA: Diagnosis not present

## 2014-12-22 DIAGNOSIS — M5031 Other cervical disc degeneration,  high cervical region: Secondary | ICD-10-CM | POA: Diagnosis not present

## 2014-12-22 DIAGNOSIS — I10 Essential (primary) hypertension: Secondary | ICD-10-CM | POA: Diagnosis not present

## 2014-12-22 NOTE — Telephone Encounter (Signed)
Patient NCS report from [facility] on [employer].

## 2014-12-24 ENCOUNTER — Encounter: Payer: Self-pay | Admitting: Neurology

## 2014-12-25 DIAGNOSIS — M7502 Adhesive capsulitis of left shoulder: Secondary | ICD-10-CM | POA: Diagnosis not present

## 2014-12-25 DIAGNOSIS — I10 Essential (primary) hypertension: Secondary | ICD-10-CM | POA: Diagnosis not present

## 2014-12-25 DIAGNOSIS — E119 Type 2 diabetes mellitus without complications: Secondary | ICD-10-CM | POA: Diagnosis not present

## 2014-12-25 DIAGNOSIS — M159 Polyosteoarthritis, unspecified: Secondary | ICD-10-CM | POA: Diagnosis not present

## 2014-12-25 DIAGNOSIS — M5032 Other cervical disc degeneration, mid-cervical region: Secondary | ICD-10-CM | POA: Diagnosis not present

## 2014-12-25 DIAGNOSIS — M5031 Other cervical disc degeneration,  high cervical region: Secondary | ICD-10-CM | POA: Diagnosis not present

## 2014-12-27 DIAGNOSIS — E119 Type 2 diabetes mellitus without complications: Secondary | ICD-10-CM | POA: Diagnosis not present

## 2014-12-27 DIAGNOSIS — I10 Essential (primary) hypertension: Secondary | ICD-10-CM | POA: Diagnosis not present

## 2014-12-27 DIAGNOSIS — M7502 Adhesive capsulitis of left shoulder: Secondary | ICD-10-CM | POA: Diagnosis not present

## 2014-12-27 DIAGNOSIS — M159 Polyosteoarthritis, unspecified: Secondary | ICD-10-CM | POA: Diagnosis not present

## 2014-12-27 DIAGNOSIS — M5032 Other cervical disc degeneration, mid-cervical region: Secondary | ICD-10-CM | POA: Diagnosis not present

## 2014-12-27 DIAGNOSIS — M5031 Other cervical disc degeneration,  high cervical region: Secondary | ICD-10-CM | POA: Diagnosis not present

## 2014-12-27 LAB — ANA COMPREHENSIVE PANEL
Anti JO-1: <0.2 AI (ref 0.0–0.9)
Centromere Ab Screen: <0.2 AI (ref 0.0–0.9)
ENA RNP Ab: <0.2 AI (ref 0.0–0.9)
Scleroderma SCL-70: 0.2 AI (ref 0.0–0.9)
dsDNA Ab: <1 IU/mL (ref 0–9)

## 2014-12-27 LAB — B12 AND FOLATE PANEL
Folate: 8.8 ng/mL (ref >3.0)
Vitamin B-12: 542 pg/mL (ref 211–946)

## 2014-12-27 LAB — MULTIPLE MYELOMA PANEL, SERUM
ALBUMIN SERPL ELPH-MCNC: 3.7 g/dL (ref 3.2–5.6)
ALBUMIN/GLOB SERPL: 1.2 (ref 0.7–2.0)
Alpha 1: 0.3 g/dL (ref 0.1–0.4)
Alpha2 Glob SerPl Elph-Mcnc: 0.6 g/dL (ref 0.4–1.2)
B-GLOBULIN SERPL ELPH-MCNC: 1.3 g/dL (ref 0.6–1.3)
Gamma Glob SerPl Elph-Mcnc: 0.9 g/dL (ref 0.5–1.6)
Globulin, Total: 3.1 g/dL (ref 2.0–4.5)
IGA/IMMUNOGLOBULIN A, SERUM: 401 mg/dL — AB (ref 87–352)
IgG (Immunoglobin G), Serum: 1011 mg/dL (ref 700–1600)
IgM (Immunoglobulin M), Srm: 72 mg/dL (ref 26–217)
Total Protein: 6.8 g/dL (ref 6.0–8.5)

## 2014-12-27 LAB — PARANEOPLASTIC PROFILE 1: Purkinje Cell (Yo) Autoantobodies- IFA: <1:10 {titer}

## 2014-12-27 LAB — HEAVY METALS, BLOOD
ARSENIC: 7 ug/L (ref 2–23)
Lead, Blood: NOT DETECTED ug/dL (ref 0–19)
Mercury: NOT DETECTED ug/L (ref 0.0–14.9)

## 2014-12-27 LAB — BASIC METABOLIC PANEL
BUN / CREAT RATIO: 18 (ref 9–23)
BUN: 10 mg/dL (ref 6–24)
CALCIUM: 9.5 mg/dL (ref 8.7–10.2)
CO2: 21 mmol/L (ref 18–29)
CREATININE: 0.55 mg/dL — AB (ref 0.57–1.00)
Chloride: 107 mmol/L (ref 97–108)
GFR, EST AFRICAN AMERICAN: 121 mL/min/{1.73_m2} (ref >59)
GFR, EST NON AFRICAN AMERICAN: 105 mL/min/{1.73_m2} (ref >59)
Glucose: 109 mg/dL — ABNORMAL HIGH (ref 65–99)
POTASSIUM: 3.8 mmol/L (ref 3.5–5.2)
Sodium: 143 mmol/L (ref 134–144)

## 2014-12-27 LAB — HIV ANTIBODY (ROUTINE TESTING W REFLEX): HIV SCREEN 4TH GENERATION: REACTIVE — AB

## 2014-12-27 LAB — HIV 1/2 AB DIFFERENTIATION
HIV 1 Ab: UNDETERMINED — AB
HIV 2 Ab: NONREACTIVE

## 2014-12-27 LAB — PAN-ANCA
ANCA Proteinase 3: <3.5 U/mL (ref 0.0–3.5)
Atypical pANCA: <1:20 {titer}
C-ANCA: <1:20 {titer}

## 2014-12-27 LAB — RHEUMATOID FACTOR: Rhuematoid fact SerPl-aCnc: 7.7 IU/mL (ref 0.0–13.9)

## 2014-12-27 LAB — RNA QUALITATIVE: HIV 1 RNA Qualitative: NEGATIVE

## 2014-12-27 LAB — HEPATITIS C ANTIBODY

## 2014-12-27 LAB — VITAMIN B6: VITAMIN B6: 2.6 ug/L (ref 2.0–32.8)

## 2014-12-27 LAB — ANTINUCLEAR ANTIBODIES, IFA: ANA TITER 1: NEGATIVE

## 2014-12-27 LAB — ANGIOTENSIN CONVERTING ENZYME: ANGIO CONVERT ENZYME: 16 U/L (ref 14–82)

## 2014-12-27 LAB — HIGH SENSITIVITY CRP: CRP, High Sensitivity: 1.45 mg/L (ref 0.00–3.00)

## 2014-12-27 LAB — VITAMIN B1: Thiamine: 75 nmol/L (ref 66.5–200.0)

## 2014-12-27 LAB — RPR: RPR Ser Ql: NONREACTIVE

## 2014-12-29 DIAGNOSIS — M7502 Adhesive capsulitis of left shoulder: Secondary | ICD-10-CM | POA: Diagnosis not present

## 2014-12-29 DIAGNOSIS — E119 Type 2 diabetes mellitus without complications: Secondary | ICD-10-CM | POA: Diagnosis not present

## 2014-12-29 DIAGNOSIS — I10 Essential (primary) hypertension: Secondary | ICD-10-CM | POA: Diagnosis not present

## 2014-12-29 DIAGNOSIS — M5031 Other cervical disc degeneration,  high cervical region: Secondary | ICD-10-CM | POA: Diagnosis not present

## 2014-12-29 DIAGNOSIS — M159 Polyosteoarthritis, unspecified: Secondary | ICD-10-CM | POA: Diagnosis not present

## 2014-12-29 DIAGNOSIS — M5032 Other cervical disc degeneration, mid-cervical region: Secondary | ICD-10-CM | POA: Diagnosis not present

## 2015-01-02 ENCOUNTER — Other Ambulatory Visit: Payer: Self-pay | Admitting: Internal Medicine

## 2015-01-02 DIAGNOSIS — M5032 Other cervical disc degeneration, mid-cervical region: Secondary | ICD-10-CM | POA: Diagnosis not present

## 2015-01-02 DIAGNOSIS — M7502 Adhesive capsulitis of left shoulder: Secondary | ICD-10-CM | POA: Diagnosis not present

## 2015-01-02 DIAGNOSIS — E119 Type 2 diabetes mellitus without complications: Secondary | ICD-10-CM | POA: Diagnosis not present

## 2015-01-02 DIAGNOSIS — I10 Essential (primary) hypertension: Secondary | ICD-10-CM | POA: Diagnosis not present

## 2015-01-02 DIAGNOSIS — M5031 Other cervical disc degeneration,  high cervical region: Secondary | ICD-10-CM | POA: Diagnosis not present

## 2015-01-02 DIAGNOSIS — M159 Polyosteoarthritis, unspecified: Secondary | ICD-10-CM | POA: Diagnosis not present

## 2015-01-04 DIAGNOSIS — I10 Essential (primary) hypertension: Secondary | ICD-10-CM | POA: Diagnosis not present

## 2015-01-04 DIAGNOSIS — M5032 Other cervical disc degeneration, mid-cervical region: Secondary | ICD-10-CM | POA: Diagnosis not present

## 2015-01-04 DIAGNOSIS — M159 Polyosteoarthritis, unspecified: Secondary | ICD-10-CM | POA: Diagnosis not present

## 2015-01-04 DIAGNOSIS — M5031 Other cervical disc degeneration,  high cervical region: Secondary | ICD-10-CM | POA: Diagnosis not present

## 2015-01-04 DIAGNOSIS — E119 Type 2 diabetes mellitus without complications: Secondary | ICD-10-CM | POA: Diagnosis not present

## 2015-01-04 DIAGNOSIS — M7502 Adhesive capsulitis of left shoulder: Secondary | ICD-10-CM | POA: Diagnosis not present

## 2015-01-05 DIAGNOSIS — M159 Polyosteoarthritis, unspecified: Secondary | ICD-10-CM | POA: Diagnosis not present

## 2015-01-05 DIAGNOSIS — M7502 Adhesive capsulitis of left shoulder: Secondary | ICD-10-CM | POA: Diagnosis not present

## 2015-01-05 DIAGNOSIS — I10 Essential (primary) hypertension: Secondary | ICD-10-CM | POA: Diagnosis not present

## 2015-01-05 DIAGNOSIS — E119 Type 2 diabetes mellitus without complications: Secondary | ICD-10-CM | POA: Diagnosis not present

## 2015-01-05 DIAGNOSIS — M5032 Other cervical disc degeneration, mid-cervical region: Secondary | ICD-10-CM | POA: Diagnosis not present

## 2015-01-05 DIAGNOSIS — M5031 Other cervical disc degeneration,  high cervical region: Secondary | ICD-10-CM | POA: Diagnosis not present

## 2015-01-08 ENCOUNTER — Telehealth: Payer: Self-pay | Admitting: *Deleted

## 2015-01-08 DIAGNOSIS — M5031 Other cervical disc degeneration,  high cervical region: Secondary | ICD-10-CM | POA: Diagnosis not present

## 2015-01-08 DIAGNOSIS — I10 Essential (primary) hypertension: Secondary | ICD-10-CM | POA: Diagnosis not present

## 2015-01-08 DIAGNOSIS — E119 Type 2 diabetes mellitus without complications: Secondary | ICD-10-CM | POA: Diagnosis not present

## 2015-01-08 DIAGNOSIS — M5032 Other cervical disc degeneration, mid-cervical region: Secondary | ICD-10-CM | POA: Diagnosis not present

## 2015-01-08 DIAGNOSIS — M7502 Adhesive capsulitis of left shoulder: Secondary | ICD-10-CM | POA: Diagnosis not present

## 2015-01-08 DIAGNOSIS — M159 Polyosteoarthritis, unspecified: Secondary | ICD-10-CM | POA: Diagnosis not present

## 2015-01-08 NOTE — Telephone Encounter (Signed)
Call from [facility], Occupational Therapist with [facility] -  # [phone]  OT is at the end of Certification period for pt.   She is asking for Verbal Orders to continue OT and Home health aide visits. She is making progress with left hand, they are stretching hand and working on opening and closing.  She would like to continue OT three time a week for three weeks , then two time a week for three weeks. Also Home health aid for twice a week for 6 weeks.   I gave the VO for this.  Will you agree?

## 2015-01-09 ENCOUNTER — Telehealth: Payer: Self-pay | Admitting: Internal Medicine

## 2015-01-09 NOTE — Telephone Encounter (Signed)
I agree to continue home health care.

## 2015-01-10 ENCOUNTER — Other Ambulatory Visit: Payer: Self-pay | Admitting: *Deleted

## 2015-01-10 DIAGNOSIS — I509 Heart failure, unspecified: Secondary | ICD-10-CM

## 2015-01-10 DIAGNOSIS — E119 Type 2 diabetes mellitus without complications: Secondary | ICD-10-CM | POA: Diagnosis not present

## 2015-01-10 DIAGNOSIS — M7502 Adhesive capsulitis of left shoulder: Secondary | ICD-10-CM | POA: Diagnosis not present

## 2015-01-10 DIAGNOSIS — I10 Essential (primary) hypertension: Secondary | ICD-10-CM | POA: Diagnosis not present

## 2015-01-10 DIAGNOSIS — M5032 Other cervical disc degeneration, mid-cervical region: Secondary | ICD-10-CM | POA: Diagnosis not present

## 2015-01-10 DIAGNOSIS — M5031 Other cervical disc degeneration,  high cervical region: Secondary | ICD-10-CM | POA: Diagnosis not present

## 2015-01-10 DIAGNOSIS — M1990 Unspecified osteoarthritis, unspecified site: Secondary | ICD-10-CM | POA: Diagnosis not present

## 2015-01-10 MED ORDER — ENALAPRIL MALEATE 20 MG PO TABS: 20.0000 mg | ORAL_TABLET | Freq: Two times a day (BID) | ORAL | Status: DC

## 2015-01-11 ENCOUNTER — Ambulatory Visit
Admission: RE | Admit: 2015-01-11 | Discharge: 2015-01-11 | Disposition: A | Discharge status: Home / Self Care | Payer: Medicare Other | Source: Ambulatory Visit | Attending: Neurology | Admitting: Neurology

## 2015-01-11 DIAGNOSIS — M5031 Other cervical disc degeneration,  high cervical region: Secondary | ICD-10-CM | POA: Diagnosis not present

## 2015-01-11 DIAGNOSIS — E119 Type 2 diabetes mellitus without complications: Secondary | ICD-10-CM | POA: Diagnosis not present

## 2015-01-11 DIAGNOSIS — M1990 Unspecified osteoarthritis, unspecified site: Secondary | ICD-10-CM | POA: Diagnosis not present

## 2015-01-11 DIAGNOSIS — IMO0002 Reserved for concepts with insufficient information to code with codable children: Secondary | ICD-10-CM

## 2015-01-11 DIAGNOSIS — G9050 Complex regional pain syndrome I, unspecified: Secondary | ICD-10-CM | POA: Diagnosis not present

## 2015-01-11 DIAGNOSIS — G1221 Amyotrophic lateral sclerosis: Principal | ICD-10-CM

## 2015-01-11 DIAGNOSIS — M7502 Adhesive capsulitis of left shoulder: Secondary | ICD-10-CM | POA: Diagnosis not present

## 2015-01-11 DIAGNOSIS — M5032 Other cervical disc degeneration, mid-cervical region: Secondary | ICD-10-CM | POA: Diagnosis not present

## 2015-01-11 DIAGNOSIS — M6289 Other specified disorders of muscle: Secondary | ICD-10-CM

## 2015-01-11 DIAGNOSIS — R292 Abnormal reflex: Secondary | ICD-10-CM | POA: Diagnosis not present

## 2015-01-11 DIAGNOSIS — R29898 Other symptoms and signs involving the musculoskeletal system: Secondary | ICD-10-CM | POA: Diagnosis not present

## 2015-01-11 DIAGNOSIS — I10 Essential (primary) hypertension: Secondary | ICD-10-CM | POA: Diagnosis not present

## 2015-01-11 DIAGNOSIS — G1229 Other motor neuron disease: Secondary | ICD-10-CM

## 2015-01-11 MED ORDER — GADOBENATE DIMEGLUMINE 529 MG/ML IV SOLN
18.0000 mL | Freq: Once | INTRAVENOUS | Status: AC | PRN
  Administered 2015-01-11: 18 mL via INTRAVENOUS

## 2015-01-12 DIAGNOSIS — I10 Essential (primary) hypertension: Secondary | ICD-10-CM | POA: Diagnosis not present

## 2015-01-12 DIAGNOSIS — M7502 Adhesive capsulitis of left shoulder: Secondary | ICD-10-CM | POA: Diagnosis not present

## 2015-01-12 DIAGNOSIS — M1990 Unspecified osteoarthritis, unspecified site: Secondary | ICD-10-CM | POA: Diagnosis not present

## 2015-01-12 DIAGNOSIS — M5032 Other cervical disc degeneration, mid-cervical region: Secondary | ICD-10-CM | POA: Diagnosis not present

## 2015-01-12 DIAGNOSIS — M5031 Other cervical disc degeneration,  high cervical region: Secondary | ICD-10-CM | POA: Diagnosis not present

## 2015-01-12 DIAGNOSIS — E119 Type 2 diabetes mellitus without complications: Secondary | ICD-10-CM | POA: Diagnosis not present

## 2015-01-15 ENCOUNTER — Telehealth: Payer: Self-pay | Admitting: Neurology

## 2015-01-15 DIAGNOSIS — E119 Type 2 diabetes mellitus without complications: Secondary | ICD-10-CM | POA: Diagnosis not present

## 2015-01-15 DIAGNOSIS — M1990 Unspecified osteoarthritis, unspecified site: Secondary | ICD-10-CM | POA: Diagnosis not present

## 2015-01-15 DIAGNOSIS — M7502 Adhesive capsulitis of left shoulder: Secondary | ICD-10-CM | POA: Diagnosis not present

## 2015-01-15 DIAGNOSIS — I10 Essential (primary) hypertension: Secondary | ICD-10-CM | POA: Diagnosis not present

## 2015-01-15 DIAGNOSIS — M5031 Other cervical disc degeneration,  high cervical region: Secondary | ICD-10-CM | POA: Diagnosis not present

## 2015-01-15 DIAGNOSIS — M5032 Other cervical disc degeneration, mid-cervical region: Secondary | ICD-10-CM | POA: Diagnosis not present

## 2015-01-15 NOTE — Telephone Encounter (Signed)
Called patient to discuss MRi of the brain results and labwork. Asked her to call back and let me know if she would like me to call her or her son back to discuss. Asked her to call for emma and give me a phone number where she or her son can be best reached. Thanks.

## 2015-01-16 DIAGNOSIS — M5032 Other cervical disc degeneration, mid-cervical region: Secondary | ICD-10-CM | POA: Diagnosis not present

## 2015-01-16 DIAGNOSIS — M7502 Adhesive capsulitis of left shoulder: Secondary | ICD-10-CM | POA: Diagnosis not present

## 2015-01-16 DIAGNOSIS — E119 Type 2 diabetes mellitus without complications: Secondary | ICD-10-CM | POA: Diagnosis not present

## 2015-01-16 DIAGNOSIS — M5031 Other cervical disc degeneration,  high cervical region: Secondary | ICD-10-CM | POA: Diagnosis not present

## 2015-01-16 DIAGNOSIS — M1990 Unspecified osteoarthritis, unspecified site: Secondary | ICD-10-CM | POA: Diagnosis not present

## 2015-01-16 DIAGNOSIS — I10 Essential (primary) hypertension: Secondary | ICD-10-CM | POA: Diagnosis not present

## 2015-01-16 NOTE — Telephone Encounter (Signed)
Patient called back and would appreciate a call back to [phone].  Thanks!

## 2015-01-17 ENCOUNTER — Telehealth: Payer: Self-pay | Admitting: Neurology

## 2015-01-17 DIAGNOSIS — M1990 Unspecified osteoarthritis, unspecified site: Secondary | ICD-10-CM | POA: Diagnosis not present

## 2015-01-17 DIAGNOSIS — M5031 Other cervical disc degeneration,  high cervical region: Secondary | ICD-10-CM | POA: Diagnosis not present

## 2015-01-17 DIAGNOSIS — E119 Type 2 diabetes mellitus without complications: Secondary | ICD-10-CM | POA: Diagnosis not present

## 2015-01-17 DIAGNOSIS — M5032 Other cervical disc degeneration, mid-cervical region: Secondary | ICD-10-CM | POA: Diagnosis not present

## 2015-01-17 DIAGNOSIS — I10 Essential (primary) hypertension: Secondary | ICD-10-CM | POA: Diagnosis not present

## 2015-01-17 DIAGNOSIS — M7502 Adhesive capsulitis of left shoulder: Secondary | ICD-10-CM | POA: Diagnosis not present

## 2015-01-17 NOTE — Telephone Encounter (Signed)
Spoke to mother. MRi of the brain was unrevealing. She had a positive HIV antibody test, indeterminant HIV 1 Ab, but HIV RNA Qualitative was negative so appears to be a false positive. Will call and discuss with Son [phone]

## 2015-01-18 DIAGNOSIS — M5032 Other cervical disc degeneration, mid-cervical region: Secondary | ICD-10-CM | POA: Diagnosis not present

## 2015-01-18 DIAGNOSIS — I10 Essential (primary) hypertension: Secondary | ICD-10-CM | POA: Diagnosis not present

## 2015-01-18 DIAGNOSIS — M1990 Unspecified osteoarthritis, unspecified site: Secondary | ICD-10-CM | POA: Diagnosis not present

## 2015-01-18 DIAGNOSIS — M5031 Other cervical disc degeneration,  high cervical region: Secondary | ICD-10-CM | POA: Diagnosis not present

## 2015-01-18 DIAGNOSIS — M7502 Adhesive capsulitis of left shoulder: Secondary | ICD-10-CM | POA: Diagnosis not present

## 2015-01-18 DIAGNOSIS — E119 Type 2 diabetes mellitus without complications: Secondary | ICD-10-CM | POA: Diagnosis not present

## 2015-01-19 DIAGNOSIS — E119 Type 2 diabetes mellitus without complications: Secondary | ICD-10-CM | POA: Diagnosis not present

## 2015-01-19 DIAGNOSIS — M7502 Adhesive capsulitis of left shoulder: Secondary | ICD-10-CM | POA: Diagnosis not present

## 2015-01-19 DIAGNOSIS — M1990 Unspecified osteoarthritis, unspecified site: Secondary | ICD-10-CM | POA: Diagnosis not present

## 2015-01-19 DIAGNOSIS — I10 Essential (primary) hypertension: Secondary | ICD-10-CM | POA: Diagnosis not present

## 2015-01-19 DIAGNOSIS — M5031 Other cervical disc degeneration,  high cervical region: Secondary | ICD-10-CM | POA: Diagnosis not present

## 2015-01-19 DIAGNOSIS — M5032 Other cervical disc degeneration, mid-cervical region: Secondary | ICD-10-CM | POA: Diagnosis not present

## 2015-01-22 DIAGNOSIS — I10 Essential (primary) hypertension: Secondary | ICD-10-CM | POA: Diagnosis not present

## 2015-01-22 DIAGNOSIS — E119 Type 2 diabetes mellitus without complications: Secondary | ICD-10-CM | POA: Diagnosis not present

## 2015-01-22 DIAGNOSIS — M7502 Adhesive capsulitis of left shoulder: Secondary | ICD-10-CM | POA: Diagnosis not present

## 2015-01-22 DIAGNOSIS — M5032 Other cervical disc degeneration, mid-cervical region: Secondary | ICD-10-CM | POA: Diagnosis not present

## 2015-01-22 DIAGNOSIS — M5031 Other cervical disc degeneration,  high cervical region: Secondary | ICD-10-CM | POA: Diagnosis not present

## 2015-01-22 DIAGNOSIS — M1990 Unspecified osteoarthritis, unspecified site: Secondary | ICD-10-CM | POA: Diagnosis not present

## 2015-01-24 DIAGNOSIS — M7502 Adhesive capsulitis of left shoulder: Secondary | ICD-10-CM | POA: Diagnosis not present

## 2015-01-24 DIAGNOSIS — E119 Type 2 diabetes mellitus without complications: Secondary | ICD-10-CM | POA: Diagnosis not present

## 2015-01-24 DIAGNOSIS — I10 Essential (primary) hypertension: Secondary | ICD-10-CM | POA: Diagnosis not present

## 2015-01-24 DIAGNOSIS — M1990 Unspecified osteoarthritis, unspecified site: Secondary | ICD-10-CM | POA: Diagnosis not present

## 2015-01-24 DIAGNOSIS — M5032 Other cervical disc degeneration, mid-cervical region: Secondary | ICD-10-CM | POA: Diagnosis not present

## 2015-01-24 DIAGNOSIS — M5031 Other cervical disc degeneration,  high cervical region: Secondary | ICD-10-CM | POA: Diagnosis not present

## 2015-01-25 DIAGNOSIS — E119 Type 2 diabetes mellitus without complications: Secondary | ICD-10-CM | POA: Diagnosis not present

## 2015-01-25 DIAGNOSIS — M5031 Other cervical disc degeneration,  high cervical region: Secondary | ICD-10-CM | POA: Diagnosis not present

## 2015-01-25 DIAGNOSIS — I10 Essential (primary) hypertension: Secondary | ICD-10-CM | POA: Diagnosis not present

## 2015-01-25 DIAGNOSIS — M1990 Unspecified osteoarthritis, unspecified site: Secondary | ICD-10-CM | POA: Diagnosis not present

## 2015-01-25 DIAGNOSIS — M5032 Other cervical disc degeneration, mid-cervical region: Secondary | ICD-10-CM | POA: Diagnosis not present

## 2015-01-25 DIAGNOSIS — M7502 Adhesive capsulitis of left shoulder: Secondary | ICD-10-CM | POA: Diagnosis not present

## 2015-01-26 DIAGNOSIS — I10 Essential (primary) hypertension: Secondary | ICD-10-CM | POA: Diagnosis not present

## 2015-01-26 DIAGNOSIS — M5032 Other cervical disc degeneration, mid-cervical region: Secondary | ICD-10-CM | POA: Diagnosis not present

## 2015-01-26 DIAGNOSIS — M7502 Adhesive capsulitis of left shoulder: Secondary | ICD-10-CM | POA: Diagnosis not present

## 2015-01-26 DIAGNOSIS — M5031 Other cervical disc degeneration,  high cervical region: Secondary | ICD-10-CM | POA: Diagnosis not present

## 2015-01-26 DIAGNOSIS — E119 Type 2 diabetes mellitus without complications: Secondary | ICD-10-CM | POA: Diagnosis not present

## 2015-01-26 DIAGNOSIS — M1990 Unspecified osteoarthritis, unspecified site: Secondary | ICD-10-CM | POA: Diagnosis not present

## 2015-01-29 ENCOUNTER — Encounter

## 2015-02-01 ENCOUNTER — Ambulatory Visit: Payer: PRIVATE HEALTH INSURANCE | Primary: Registered Nurse

## 2015-02-01 ENCOUNTER — Telehealth: Payer: Self-pay | Admitting: Internal Medicine

## 2015-02-01 NOTE — Telephone Encounter (Signed)
Patient returning call.

## 2015-02-01 NOTE — Telephone Encounter (Signed)
Izora Gala from [facility] said she was returning a call to Debbi.

## 2015-02-01 NOTE — Telephone Encounter (Signed)
Left message for pt to call clinic on home phone ID recording.

## 2015-02-02 ENCOUNTER — Inpatient Hospital Stay: Admit: 2015-02-02 | Payer: PRIVATE HEALTH INSURANCE | Attending: Family Medicine | Primary: Registered Nurse

## 2015-02-02 ENCOUNTER — Telehealth: Payer: Self-pay | Admitting: *Deleted

## 2015-02-02 DIAGNOSIS — Z1231 Encounter for screening mammogram for malignant neoplasm of breast: Secondary | ICD-10-CM

## 2015-02-02 NOTE — Telephone Encounter (Signed)
Julie Elliott from [facility] [phone] called 02/01/15 PM - Pt does not seem to be progressing with OT. Left hand and arm are very sensitive to do therapy. At one point - OT thought pt may need a custom made splint for left hand and arm. Talked with pt 02/02/15 AM - pt has a rash from pelvic to hip area on left side. Pt called it a heat rash. Pt states the sensitivity and rash has been going on about 6 months. Left hip is now has a pulling feeling. Pt states Julie Elliott took away her CNA for bathing. Appt made 02/08/15 1:30PM Dr Tiburcio Pea. Hilda Blades Cavion Faiola RN 02/02/15 10:30AM

## 2015-02-02 NOTE — Telephone Encounter (Signed)
Talked with pt 02/02/15 10:30AM. Hilda Blades Missy Baksh RN 02/02/15 10:30AM

## 2015-02-07 ENCOUNTER — Telehealth: Payer: Self-pay | Admitting: Internal Medicine

## 2015-02-07 NOTE — Telephone Encounter (Signed)
Call to patient to confirm appointment for 02/08/15 at 1:30 lmtcb

## 2015-02-08 ENCOUNTER — Ambulatory Visit (INDEPENDENT_AMBULATORY_CARE_PROVIDER_SITE_OTHER): Payer: Medicare Other | Admitting: Internal Medicine

## 2015-02-08 ENCOUNTER — Encounter: Payer: Self-pay | Admitting: Internal Medicine

## 2015-02-08 VITALS — BP 106/75 | HR 85 | Temp 98.1°F | Wt 183.8 lb

## 2015-02-08 DIAGNOSIS — E119 Type 2 diabetes mellitus without complications: Secondary | ICD-10-CM

## 2015-02-08 DIAGNOSIS — G9520 Unspecified cord compression: Secondary | ICD-10-CM | POA: Insufficient documentation

## 2015-02-08 DIAGNOSIS — R270 Ataxia, unspecified: Secondary | ICD-10-CM

## 2015-02-08 DIAGNOSIS — E118 Type 2 diabetes mellitus with unspecified complications: Secondary | ICD-10-CM

## 2015-02-08 LAB — GLUCOSE, CAPILLARY: Glucose-Capillary: 92 mg/dL (ref 65–99)

## 2015-02-08 LAB — POCT GLYCOSYLATED HEMOGLOBIN (HGB A1C): HEMOGLOBIN A1C: 5.3

## 2015-02-08 NOTE — Patient Instructions (Addendum)
Thank you for your visit today. We will talk with the social worker and she will call you (son) to set up evaluation for PT/OT   Please call your neurologist to set up an appointment with him.  If you need a referral to any specialist, we will be happy to do that, please let us know.  Please follow up with your PCP as usual.  General Instructions:    Treatment Goals:  Goals (1 Years of Data) as of 02/08/15          As of Today 12/21/14 11/03/14 10/17/14 09/12/14     Blood Pressure   . Blood Pressure < 140/90  106/75 124/83 130/75 102/65 103/65     Result Component   . HEMOGLOBIN A1C < 7.0      5.9   . LDL CALC < 100            Progress Toward Treatment Goals:  Treatment Goal 11/03/2014  Hemoglobin A1C -  Blood pressure at goal  Stop smoking -    Self Care Goals & Plans:  Self Care Goal 11/03/2014  Manage my medications take my medicines as prescribed; bring my medications to every visit; refill my medications on time  Monitor my health -  Eat healthy foods eat foods that are low in salt; eat baked foods instead of fried foods  Be physically active find an activity I enjoy    Home Blood Glucose Monitoring 06/20/2014  Check my blood sugar once a day  When to check my blood sugar -     Care Management & Community Referrals:  Referral 10/17/2014  Referrals made for care management support nutritionist    RE: MyChart  Dear Ms. Lajara  We are excited to introduce MyChart, a new best-in-class service that provides you online access to important information in your electronic medical record. We want to make it easier for you to view your health information - all in one secure location - when and where you need it. We expect MyChart will enhance the quality of care and service we provide. Use the activation code below to enroll in MyChart online at [email or web address]  When you register for MyChart, you can:  Marland Kitchen View your test results. . Communicate securely with  your physician's office.  . View your medical history, allergies, medications, and immunizations. . Conveniently print information such as your medication lists.  If you are age 57 or older and want a member of your family to have access to your record, you must provide written consent by completing a proxy form available at our facility. Please speak to our clinical staff about guidelines regarding accounts for patients younger than age 57.  As you activate your MyChart account and need any technical assistance, please call the MyChart technical support line at [phone]) or email your question to [email or web address]. If you email your question(s), please include your name, a return phone number and the best time to reach you.  Thank you for using MyChart as your new health and wellness resource!  MyChart Activation Code:  [email or web address] Expires: 04/09/2015  1:33 PM

## 2015-02-08 NOTE — Progress Notes (Signed)
Patient ID: Julie Elliott, female   DOB: 16-Jan-1956, 59 y.o.   MRN: [id number]   Subjective:   Patient ID: Julie Elliott female   DOB: [birth year] 59 y.o.   MRN: [id number]  HPI: Julie Elliott is a 59 y.o. woman with a notable PMH of progressive ataxia, weakness, left hand pain, CHF, GERD, T2DM, HLD, who is here for a follow up after seeing her neurologist. 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  She has had quite an extensive work up done at the neurologist, and also here which have included ACE enzyme, B1,B6,B12, HIV, RPR, paraneoplastic, pan-ANCA, ESR, TSH, RF, MM, and they were all quite unrevealing. Her MRI was also non-specific.  She has been having difficulty with ADLs, and has lost over 100 pounds.   Her main concern today was what to do about the spasticity, and also she wanted renewal of her occupational therapist, and PT. Her left hand pain has gotten worse and she has consulted both sports medicine and orthopaedics and they were thinking she has carpal tunnel. However, she said today she has tingling mainly in the last two digits so it seems more ulnar nerve etiology to me. She does not wear a splint and is unable to flex and extend her left hand.  She used to have home OT until last week but it was decided by OT that therapy was not helping. But patient thought therapy was helping her.   Her son also wanted [place] of attorney so we provided him all the documents.    Past Medical History  Diagnosis Date  . Hypertension     well controlled  . DJD (degenerative joint disease)   . HLD (hyperlipidemia) 2008    stable, well controlled  . DM (diabetes mellitus) 2008    stable HgBA1C at 6.5  . GERD (gastroesophageal reflux disease)     well controlled on Omeprazole  . Chronic headaches   . CHF (congestive heart failure)    Current Outpatient Prescriptions  Medication Sig  Dispense Refill  . aspirin 81 MG EC tablet Take 1 tablet (81 mg total) by mouth daily. 90 tablet 1  . Calcium-Vitamin D 600-125 MG-UNIT TABS Take 1 tablet by mouth daily. 90 each 1  . enalapril (VASOTEC) 20 MG tablet Take 1 tablet (20 mg total) by mouth 2 (two) times daily. 180 tablet 3  . metFORMIN (GLUCOPHAGE) 500 MG tablet TAKE 1 TABLET ONCE A DAY WITH BREAKFAST 30 tablet 5  . naproxen (NAPROSYN) 500 MG tablet Take 1 tablet (500 mg total) by mouth 2 (two) times daily. 180 tablet 3  . potassium chloride SA (K-DUR,KLOR-CON) 20 MEQ tablet Take 1 tablet (20 mEq total) by mouth daily. 90 tablet 3  . simvastatin (ZOCOR) 40 MG tablet Take 1 tablet (40 mg total) by mouth daily. 90 tablet 3  . acetaminophen (TYLENOL) 650 MG CR tablet Take 1 tablet (650 mg total) by mouth every 8 (eight) hours as needed for pain. (Patient not taking: Reported on 02/08/2015) 30 tablet 0  . B-D ULTRA-FINE 33 LANCETS MISC Use for checking blood sugar, 3 times per day. Diagnosis Code is 250.00 (Patient not taking: Reported on 02/08/2015) 100 each 11  . diclofenac sodium (VOLTAREN) 1 % GEL Apply 2 g topically 4 (four) times daily. (Patient not taking: Reported on 02/08/2015) 100 g 0  . glucose  blood (BAYER CONTOUR TEST) test strip [employer]. Use to test blood sugar 3 times a day. (Patient not taking: Reported on 02/08/2015) 100 each 11  . metoprolol succinate (TOPROL-XL) 50 MG 24 hr tablet Take 1 tablet (50 mg total) by mouth daily. (Patient not taking: Reported on 02/08/2015) 90 tablet 3  . omeprazole (PRILOSEC) 20 MG capsule TAKE (1) CAPSULE TWICE DAILY. (Patient not taking: Reported on 02/08/2015) 180 capsule 2   No current facility-administered medications for this visit.   Family History  Problem Relation Age of Onset  . Diabetes Mother   . Heart disease Mother   . Prostate cancer Father   . Neuropathy Neg Hx    History   Social History  . Marital Status: Single    Spouse Name: N/A  . Number of Children: 3  . Years of  Education: 12   Occupational History  . Unemployed    Social History Main Topics  . Smoking status: Former Smoker -- 0.30 packs/day    Types: Cigarettes    Quit date: 04/18/2013  . Smokeless tobacco: Not on file     Comment: Smokes 5-6 cigerettes per day, 12/21/14 smokes rarely under stressful situations  . Alcohol Use: No  . Drug Use: No  . Sexual Activity: Not on file   Other Topics Concern  . None   Social History Narrative   to get supplies nfrom [facility] [employer] per pt request.1--[phone]   Lives at home with son.    Right handed.   Caffeine use: drinks coffee-rare   Review of Systems: Review of Systems  Constitutional: Positive for weight loss and malaise/fatigue.  HENT: Negative for hearing loss.   Eyes: Negative for blurred vision.  Musculoskeletal: Negative for back pain, falls and neck pain.  Skin: Positive for rash.  Neurological: Positive for tingling, focal weakness and weakness. Negative for dizziness and headaches.       Left hand is very stiff and has loss of balance  Psychiatric/Behavioral: Negative for depression and memory loss.     Objective:  Physical Exam: Filed Vitals:   02/08/15 1353  BP: 106/75  Pulse: 85  Temp: 98.1 F (36.7 C)  TempSrc: Oral  Weight: 183 lb 12.8 oz (83.371 kg)  SpO2: 100%   Physical Exam  Constitutional: She is oriented to person, place, and time.  Well developed, but loss of weight >90 lbs over the course of a year and half Sitting in wheel chair  HENT:  Head: Normocephalic and atraumatic.  Eyes: EOM are normal. Pupils are equal, round, and reactive to light.  Neck: Normal range of motion.  Cardiovascular: Normal rate, regular rhythm and intact distal pulses.   No murmur heard. Pulmonary/Chest: Effort normal and breath sounds normal. No respiratory distress.  Neurological: She is alert and oriented to person, place, and time. She has normal reflexes. She exhibits abnormal muscle tone. Coordination abnormal.  Cn II-XII  grossly intact Sensory: both UE and LE intact to light touch and pinprick b/l Motor: 2+ strength in upper and lower extremities, but decreased strength in left hand grip Cerebellar: Finger to nose test intact, has loss of balance  Left hand is very spastic and was flexed. Pt had trouble flexing and extending fingers   Skin: Skin is dry. Rash noted.  Psychiatric: She has a normal mood and affect.     Assessment & Plan:  Please see problem based charting for A&P

## 2015-02-08 NOTE — Progress Notes (Signed)
Internal Medicine Clinic Attending  I saw and evaluated the patient.  I personally confirmed the key portions of the history and exam documented by Dr. Burgess Estelle and I reviewed pertinent patient test results.  The assessment, diagnosis, and plan were formulated together and I agree with the documentation in the resident's note.  59 year old female with progressive ataxia and weight loss, spasticity who has been extensively worked up with negative results so far. The patient has had a CTA chest, and MRI brain as a part of work up, ESR, CRP, mammogram, colonoscopy (2009) and basic rheum work up. CT abdomen pelvis has not been done, but she has no abdominal or GI symptoms. She has seen neurology who did MR brain which does not show any findings that correspond with the symptomatology to me. I met with the patient and suggested follow up with neurology to discuss MRI findings and they understand. I also suggested that it might be worth while at this point to seek care at one of the higher level of care institutions like [facility] or [facility]. She can also be referred to a rheumatologist although basic rheumatology work up is negative.   The son wants to assume HPOA position and the mother agrees. Relevant papers and forms provided.    Madilyn Fireman MD MPH 02/08/2015 3:11 PM

## 2015-02-08 NOTE — Progress Notes (Deleted)
   Subjective:    Patient ID: Julie Elliott, female    DOB: 1955/12/22, 59 y.o.   MRN: [id number]  HPI    Review of Systems     Objective:   Physical Exam        Assessment & Plan:

## 2015-02-08 NOTE — Assessment & Plan Note (Signed)
We rechecked her HbA1c today as it has been more than 61months since her last one and it has been fluctuating a lot given her weight loss  Plan is to follow up with Dr Arcelia Jew for management of this.

## 2015-02-09 NOTE — Addendum Note (Signed)
Addended by: Burgess Estelle A on: 02/09/2015 01:34 PM   Modules accepted: Orders

## 2015-02-12 ENCOUNTER — Telehealth: Payer: Self-pay | Admitting: Licensed Clinical Social Worker

## 2015-02-13 NOTE — Telephone Encounter (Signed)
Julie Elliott was referred to CSW for home health services.  CSW placed call to Julie Elliott.  CSW discussed both HH and PCS services, pt states she is in need of both services.  Julie Elliott prefers [facility] for [facility], but requests CSW to contact son, Julie Elliott to confirm he is in agreement.  She states he has been doing research on agencies.  CSW placed call to son, Julie Elliott.  Julie Elliott states pt was with [facility] and he was not satisfied with their services and request referral to be sent to [facility] CSW discussed both HH and PCS and payor sources.  Son aware.  Referral sent to [facility].  [facility] requesting order to be signed by attending.  Referral printed awaiting attending signature to be faxed to [facility].  PCS initiated and placed in physician mailbox.

## 2015-02-15 DIAGNOSIS — I509 Heart failure, unspecified: Secondary | ICD-10-CM | POA: Diagnosis not present

## 2015-02-15 DIAGNOSIS — I10 Essential (primary) hypertension: Secondary | ICD-10-CM | POA: Diagnosis not present

## 2015-02-15 DIAGNOSIS — R270 Ataxia, unspecified: Secondary | ICD-10-CM | POA: Diagnosis not present

## 2015-02-15 DIAGNOSIS — E119 Type 2 diabetes mellitus without complications: Secondary | ICD-10-CM | POA: Diagnosis not present

## 2015-02-15 DIAGNOSIS — M150 Primary generalized (osteo)arthritis: Secondary | ICD-10-CM | POA: Diagnosis not present

## 2015-02-15 DIAGNOSIS — Z87891 Personal history of nicotine dependence: Secondary | ICD-10-CM | POA: Diagnosis not present

## 2015-02-15 DIAGNOSIS — K219 Gastro-esophageal reflux disease without esophagitis: Secondary | ICD-10-CM | POA: Diagnosis not present

## 2015-02-15 DIAGNOSIS — E785 Hyperlipidemia, unspecified: Secondary | ICD-10-CM | POA: Diagnosis not present

## 2015-02-20 DIAGNOSIS — M150 Primary generalized (osteo)arthritis: Secondary | ICD-10-CM | POA: Diagnosis not present

## 2015-02-20 DIAGNOSIS — I509 Heart failure, unspecified: Secondary | ICD-10-CM | POA: Diagnosis not present

## 2015-02-20 DIAGNOSIS — K219 Gastro-esophageal reflux disease without esophagitis: Secondary | ICD-10-CM | POA: Diagnosis not present

## 2015-02-20 DIAGNOSIS — I10 Essential (primary) hypertension: Secondary | ICD-10-CM | POA: Diagnosis not present

## 2015-02-20 DIAGNOSIS — E119 Type 2 diabetes mellitus without complications: Secondary | ICD-10-CM | POA: Diagnosis not present

## 2015-02-20 DIAGNOSIS — R270 Ataxia, unspecified: Secondary | ICD-10-CM | POA: Diagnosis not present

## 2015-02-21 DIAGNOSIS — I10 Essential (primary) hypertension: Secondary | ICD-10-CM | POA: Diagnosis not present

## 2015-02-21 DIAGNOSIS — I509 Heart failure, unspecified: Secondary | ICD-10-CM | POA: Diagnosis not present

## 2015-02-21 DIAGNOSIS — R270 Ataxia, unspecified: Secondary | ICD-10-CM | POA: Diagnosis not present

## 2015-02-21 DIAGNOSIS — E119 Type 2 diabetes mellitus without complications: Secondary | ICD-10-CM | POA: Diagnosis not present

## 2015-02-21 DIAGNOSIS — K219 Gastro-esophageal reflux disease without esophagitis: Secondary | ICD-10-CM | POA: Diagnosis not present

## 2015-02-21 DIAGNOSIS — M150 Primary generalized (osteo)arthritis: Secondary | ICD-10-CM | POA: Diagnosis not present

## 2015-02-22 DIAGNOSIS — E119 Type 2 diabetes mellitus without complications: Secondary | ICD-10-CM | POA: Diagnosis not present

## 2015-02-22 DIAGNOSIS — R270 Ataxia, unspecified: Secondary | ICD-10-CM | POA: Diagnosis not present

## 2015-02-22 DIAGNOSIS — M150 Primary generalized (osteo)arthritis: Secondary | ICD-10-CM | POA: Diagnosis not present

## 2015-02-22 DIAGNOSIS — I10 Essential (primary) hypertension: Secondary | ICD-10-CM | POA: Diagnosis not present

## 2015-02-22 DIAGNOSIS — K219 Gastro-esophageal reflux disease without esophagitis: Secondary | ICD-10-CM | POA: Diagnosis not present

## 2015-02-22 DIAGNOSIS — I509 Heart failure, unspecified: Secondary | ICD-10-CM | POA: Diagnosis not present

## 2015-02-23 DIAGNOSIS — R270 Ataxia, unspecified: Secondary | ICD-10-CM | POA: Diagnosis not present

## 2015-02-23 DIAGNOSIS — E119 Type 2 diabetes mellitus without complications: Secondary | ICD-10-CM | POA: Diagnosis not present

## 2015-02-23 DIAGNOSIS — M150 Primary generalized (osteo)arthritis: Secondary | ICD-10-CM | POA: Diagnosis not present

## 2015-02-23 DIAGNOSIS — K219 Gastro-esophageal reflux disease without esophagitis: Secondary | ICD-10-CM | POA: Diagnosis not present

## 2015-02-23 DIAGNOSIS — I10 Essential (primary) hypertension: Secondary | ICD-10-CM | POA: Diagnosis not present

## 2015-02-23 DIAGNOSIS — I509 Heart failure, unspecified: Secondary | ICD-10-CM | POA: Diagnosis not present

## 2015-02-26 DIAGNOSIS — I509 Heart failure, unspecified: Secondary | ICD-10-CM | POA: Diagnosis not present

## 2015-02-26 DIAGNOSIS — M150 Primary generalized (osteo)arthritis: Secondary | ICD-10-CM | POA: Diagnosis not present

## 2015-02-26 DIAGNOSIS — K219 Gastro-esophageal reflux disease without esophagitis: Secondary | ICD-10-CM | POA: Diagnosis not present

## 2015-02-26 DIAGNOSIS — I10 Essential (primary) hypertension: Secondary | ICD-10-CM | POA: Diagnosis not present

## 2015-02-26 DIAGNOSIS — E119 Type 2 diabetes mellitus without complications: Secondary | ICD-10-CM | POA: Diagnosis not present

## 2015-02-26 DIAGNOSIS — R270 Ataxia, unspecified: Secondary | ICD-10-CM | POA: Diagnosis not present

## 2015-02-26 NOTE — Telephone Encounter (Signed)
Signed by Dr. Lynnae January.  Emailed to [facility] per [place] C. request.

## 2015-02-27 ENCOUNTER — Ambulatory Visit (INDEPENDENT_AMBULATORY_CARE_PROVIDER_SITE_OTHER): Payer: Medicare Other | Admitting: Internal Medicine

## 2015-02-27 ENCOUNTER — Encounter: Payer: Self-pay | Admitting: Internal Medicine

## 2015-02-27 VITALS — BP 98/66 | HR 86 | Temp 98.5°F | Ht 63.5 in | Wt 175.2 lb

## 2015-02-27 DIAGNOSIS — E119 Type 2 diabetes mellitus without complications: Secondary | ICD-10-CM | POA: Diagnosis not present

## 2015-02-27 DIAGNOSIS — I509 Heart failure, unspecified: Secondary | ICD-10-CM | POA: Diagnosis not present

## 2015-02-27 DIAGNOSIS — R270 Ataxia, unspecified: Secondary | ICD-10-CM | POA: Diagnosis not present

## 2015-02-27 DIAGNOSIS — E785 Hyperlipidemia, unspecified: Secondary | ICD-10-CM | POA: Diagnosis not present

## 2015-02-27 DIAGNOSIS — R634 Abnormal weight loss: Secondary | ICD-10-CM | POA: Diagnosis not present

## 2015-02-27 DIAGNOSIS — M150 Primary generalized (osteo)arthritis: Secondary | ICD-10-CM | POA: Diagnosis not present

## 2015-02-27 DIAGNOSIS — K219 Gastro-esophageal reflux disease without esophagitis: Secondary | ICD-10-CM | POA: Diagnosis not present

## 2015-02-27 DIAGNOSIS — I10 Essential (primary) hypertension: Secondary | ICD-10-CM

## 2015-02-27 NOTE — Patient Instructions (Signed)
-   Stop taking Metformin - Will work on referral to [facility] rheumatologist - Will check with [facility] about home health services   General Instructions:   Please bring your medicines with you each time you come to clinic.  Medicines may include prescription medications, over-the-counter medications, herbal remedies, eye drops, vitamins, or other pills.   Progress Toward Treatment Goals:  Treatment Goal 11/03/2014  Hemoglobin A1C -  Blood pressure at goal  Stop smoking -    Self Care Goals & Plans:  Self Care Goal 02/27/2015  Manage my medications take my medicines as prescribed; bring my medications to every visit; refill my medications on time; follow the sick day instructions if I am sick  Monitor my health keep track of my blood pressure; keep track of my weight; check my feet daily  Eat healthy foods eat more vegetables; eat fruit for snacks and desserts; eat baked foods instead of fried foods; eat foods that are low in salt; drink diet soda or water instead of juice or soda  Be physically active find an activity I enjoy    Home Blood Glucose Monitoring 06/20/2014  Check my blood sugar once a day  When to check my blood sugar -     Care Management & Community Referrals:  Referral 10/17/2014  Referrals made for care management support nutritionist

## 2015-02-28 DIAGNOSIS — R270 Ataxia, unspecified: Secondary | ICD-10-CM | POA: Diagnosis not present

## 2015-02-28 DIAGNOSIS — M150 Primary generalized (osteo)arthritis: Secondary | ICD-10-CM | POA: Diagnosis not present

## 2015-02-28 DIAGNOSIS — K219 Gastro-esophageal reflux disease without esophagitis: Secondary | ICD-10-CM | POA: Diagnosis not present

## 2015-02-28 DIAGNOSIS — I10 Essential (primary) hypertension: Secondary | ICD-10-CM | POA: Diagnosis not present

## 2015-02-28 DIAGNOSIS — I509 Heart failure, unspecified: Secondary | ICD-10-CM | POA: Diagnosis not present

## 2015-02-28 DIAGNOSIS — E119 Type 2 diabetes mellitus without complications: Secondary | ICD-10-CM | POA: Diagnosis not present

## 2015-02-28 LAB — GLUCOSE, CAPILLARY: GLUCOSE-CAPILLARY: 89 mg/dL (ref 65–99)

## 2015-02-28 LAB — LIPID PANEL
CHOL/HDL RATIO: 2.6 ratio (ref <=5.0)
CHOLESTEROL: 112 mg/dL — AB (ref 125–200)
HDL: 43 mg/dL — ABNORMAL LOW (ref >=46)
LDL CALC: 54 mg/dL (ref <130)
Triglycerides: 75 mg/dL (ref <150)
VLDL: 15 mg/dL (ref <30)

## 2015-02-28 NOTE — Assessment & Plan Note (Addendum)
Patient continues to lose weight. Her weight is down to 175 lbs today. She was 194 in May and 183 at her last visit on 02/08/15. She has lost She reports good appetite. She has lost close to 100 lbs within the last year. It is unclear if her neurological symptoms are related to her weight loss. Work up for unintentional weight loss has been negative so far. Her mammogram and cervical pap smear were negative for malignancy. TSH, ESR, and LDH all normal from last visit. She has had additional work up for her ataxia, which has all been negative as well. Only positive result has been a mildly elevated IgA level on her multiple myeloma panel. Will follow up Neurology notes from appointment this Friday (7/27) and refer to tertiary center based on those recommendations.

## 2015-02-28 NOTE — Assessment & Plan Note (Signed)
BP Readings from Last 3 Encounters:  02/27/15 98/66  02/08/15 106/75  12/21/14 124/83    Lab Results  Component Value Date   NA 143 12/21/2014   K 3.8 12/21/2014   CREATININE 0.55* 12/21/2014    Assessment: Blood pressure control: controlled Progress toward BP goal:  at goal Comments: BP low-normal. Patient only taking Enalapril 20 mg BID. She reports symptoms of dizziness. Unable to obtain orthostatics due to her lower extremity weakness/ataxia.   Plan: Medications:  Stop Enalapril as patient experiencing dizziness and BP in low-normal range. Her extensive weight loss is likely contributing to her decreased medication requirements as well. Educational resources provided: brochure, handout, video

## 2015-02-28 NOTE — Assessment & Plan Note (Signed)
Patient was seen by [facility] on 12/21/14 for her progressive ataxia and left hand pain/weakness and has undergone an extensive work up. There was a concern for a possible upper motor neuron lesion given her decreased sensation distally, weakness, increased tone in the lower extremities with left upgoing toe, and very brisk reflexes. An MRI of the brain showed tiny scattered periventricular and subcortical nonspecific white matter hyperintensities.   Patient is accompanied by her son, [name], today and both state that they have not reviewed the MRI with neurology yet. [name] states they have an appointment this Friday (7/29).   The differential for the MRI findings includes: small vessel disease, vasculitis, demyelinating (MS?), autoimmune, infectious conditions of migraine or remote trauma. Her work up thus far has included the following:  Autoimmune/Inflammatory: ESR nml, CRP (high sens) nml, ANA neg (comprehensive including scleroderma, sjogren's, etc), LDH nml, ANCA panel negative, RF nml, ACE normal, TSH nml  Infectious: HIV negative, Hep C Ab neg, RPR neg  Vitamin deficiency: Vit B1, B6, and B12 normal, folate normal, heavy metals neg  Malignancy: paraneoplastic panel negative. Only positive result has been mildly increased IgA level on multiple myeloma panel. The differential for this includes IgA nephropathy, Henoch-Schnlein Purpura, AIDS, alcoholic cirrhosis, advanced hepatitis, IgA myeloma, and several autoimmune diseases (eg, rheumatoid arthritis, systemic lupus erythematosus). However, several of these are less likely given the above results.   I am concerned given her MRI findings that she may have an unusual autoimmune or inflammatory condition. I will follow up notes from patient's appointment with Neurology on Friday and determine if she would be best referred to a neurology or rheumatology specialist at a larger academic center such as [facility] or [facility].

## 2015-02-28 NOTE — Assessment & Plan Note (Signed)
Lab Results  Component Value Date   HGBA1C 5.3 02/08/2015   HGBA1C 5.9 09/12/2014   HGBA1C 6.8 06/20/2014     Assessment: Diabetes control: good control (HgbA1C at goal) Progress toward A1C goal:  at goal Comments: HbA1c continues to trend down to 5.3. Likely secondary to her extensive weight loss. Will stop Metformin at this time as she is on lowest dose (500 mg daily) and now overcontrolled.   Plan: Medications:  Stop Metformin Instruction/counseling given: reminded to bring medications to each visit Educational resources provided: brochure, handout Other plans:  - Will continue to monitor blood sugars periodically and restart Metformin if becomes necessary. I doubt she has diabetes now given her extensive weight loss.

## 2015-02-28 NOTE — Assessment & Plan Note (Signed)
Check lipid panel today. Continue Simvastatin 40 mg daily. 

## 2015-02-28 NOTE — Progress Notes (Signed)
   Subjective:    Patient ID: Julie Elliott, female    DOB: 1955-09-21, 59 y.o.   MRN: [id number]  HPI Julie Elliott is a 59yo woman with PMHx of HTN, Type 2 DM, and hyperlipidemia who presents today for a follow up visit.   Please refer to A&P documentation.   Review of Systems General: Reports continued weight loss, dizziness. Denies fever, chills, night sweats, changes in appetite HEENT: Reports smelling burning. Denies headaches, ear pain, changes in vision, rhinorrhea, sore throat CV: Denies CP, palpitations, SOB, orthopnea Pulm: Denies SOB, cough, wheezing GI: Denies abdominal pain, nausea, vomiting, diarrhea, constipation, melena, hematochezia GU: Denies dysuria, hematuria, frequency Msk: Denies muscle cramps, joint pains Neuro: Reports burning ("icy-hot") sensation in extremities.  Skin: Denies rashes, bruising    Objective:   Physical Exam General: sitting up in chair, NAD HEENT: [place]/AT, EOMI, sclera anicteric, mucus membranes moist  CV: RRR, no m/g/r Pulm: CTA bilaterally, breaths non-labored  Abd: BS+, soft, non-tender Ext: warm, no peripheral edema. Left hand has decreased grip strength, difficulty flexing and extending fingers. Decreased ROM in left shoulder.  Neuro: alert and oriented x 3. CNs II-XII grossly intact. Reports hot burning sensation of skin when extremities palpated, worse on left side.     Assessment & Plan:  Refer to A&P documentation.

## 2015-03-01 ENCOUNTER — Ambulatory Visit (INDEPENDENT_AMBULATORY_CARE_PROVIDER_SITE_OTHER): Payer: Medicare Other | Admitting: Neurology

## 2015-03-01 ENCOUNTER — Encounter: Payer: Self-pay | Admitting: Neurology

## 2015-03-01 VITALS — BP 109/73 | HR 92 | Temp 98.3°F | Ht 63.5 in | Wt 174.6 lb

## 2015-03-01 DIAGNOSIS — G1220 Motor neuron disease, unspecified: Secondary | ICD-10-CM

## 2015-03-01 DIAGNOSIS — R258 Other abnormal involuntary movements: Secondary | ICD-10-CM

## 2015-03-01 DIAGNOSIS — R270 Ataxia, unspecified: Secondary | ICD-10-CM | POA: Diagnosis not present

## 2015-03-01 DIAGNOSIS — M150 Primary generalized (osteo)arthritis: Secondary | ICD-10-CM | POA: Diagnosis not present

## 2015-03-01 DIAGNOSIS — Z789 Other specified health status: Secondary | ICD-10-CM

## 2015-03-01 DIAGNOSIS — I509 Heart failure, unspecified: Secondary | ICD-10-CM | POA: Diagnosis not present

## 2015-03-01 DIAGNOSIS — R531 Weakness: Secondary | ICD-10-CM

## 2015-03-01 DIAGNOSIS — R252 Cramp and spasm: Secondary | ICD-10-CM

## 2015-03-01 DIAGNOSIS — W19XXXA Unspecified fall, initial encounter: Secondary | ICD-10-CM

## 2015-03-01 DIAGNOSIS — K219 Gastro-esophageal reflux disease without esophagitis: Secondary | ICD-10-CM | POA: Diagnosis not present

## 2015-03-01 DIAGNOSIS — I10 Essential (primary) hypertension: Secondary | ICD-10-CM | POA: Diagnosis not present

## 2015-03-01 DIAGNOSIS — E119 Type 2 diabetes mellitus without complications: Secondary | ICD-10-CM | POA: Diagnosis not present

## 2015-03-01 NOTE — Progress Notes (Signed)
[name]    Provider:  Dr Jaynee Eagles Referring Provider: [name], MD Primary Care Physician:  [name], MD  CC: Progressive gait ataxia  Interval update:  Julie Elliott is a 59 y.o. female here as a follow up. Here with her son.   She is still losing weight. She lost 12-14 pounds in a 2 week period. She is losing muscle mass now. She can't stand up. She can't walk for a long period of time. Her PT and OT was discontinued. Patient reports that they told her they were wasting her time. No incontinence. No problems swallowing. No PBA. No weakness in facial muscles. No muscle cramping. Still with left-sided arm pain and weakness. Continuous pain. Stiffness in the legs.   Initial visit 12/21/2014: Julie Elliott is a 59 y.o. female here as a referral from Dr. Arcelia Jew for progressive gait ataxia, left hand pain and weakness. [name] is a 59yo woman with PMHx of HTN, systolic CHF, GERD, Type 2 DM, and hyperlipidemia who presents today for the following:  She is having problems with the left hand. She has joint pain and out of nowhere the hand locked up. Stiffness, arthritic-type pain. Doesn't stop when she sleeps, the pain is continuous all day lone. Progressing and worsening. Left leg is weak. Also burning in the left leg. And coldness in both legs. They were in the ED January first, she felt like she couldn't move. Her legs were cold. She has lost a lot of weight quickly, over 100 pounds in the last 6 months. She eats the same, good appetite. she was told that her symptoms were related to arthritis and diabetic neuropathy. One month later they were in the ED again, decreased mobility and ataxia. Now she needs a cane to walk. Issues with balance and near falls. She has developed a frozen shoulder. Progressive, incapaciting pain 10/10 with difficulty performing daily tasks such as cleaning, toileting, bathing, cooking. Symptoms are mostly on the left side but her right leg is now  affected. Assymmetric. Right leg slightly involved. Weakness as well as pain mostly on the left. She gets very hot. The left hand symptoms were acute, the other symptoms have been insidious and have slowly progressed. She has to have someone bathe her now. Balance is poor. She has not fallen but very off balance. No injuries, no inciting factors. She had stoped taking her insulin in December because of her weight loss she no longer needs it. Son is resent with her and provides much of the information.   EMG/NCS showed severe CTS. Had emg/ncs at [facility].   Reviewed notes, labs and imaging from outside physicians, which showed:  DG Lumbar spine (reviewed images and agree with the following) : No evidence for fracture. Loss of disc height is seen at L3-4 and L5-S1 advanced facet osteoarthritis is seen bilaterally in the lower lumbar levels. No worrisome lytic or sclerotic osseous abnormality.  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  DG Cervical Spine: The cervicothoracic junction is partially obscured by osseous and soft tissue overlap on the lateral view. Cervical spine alignment is maintained. Vertebral body heights are preserved. There is disc space narrowing at C4-C5, C5-C6, and C6-C7 with associated endplate spurs. There is bony neural foraminal narrowing at C5-C6 bilaterally. The dens is intact. Posterior elements appear well-aligned. There is no evidence of fracture. No prevertebral soft tissue edema.  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 Bony neural foraminal narrowing at C5-C6.  No acute bony abnormality.  DG Hip: There is no evidence of fracture. There is osteoarthritis of both hips with mild joint space narrowing and marginal osteophytes. Sacroiliac joints appear normal. The patient has a tendency towards calcification at tendon insertions.  IMPRESSION: No acute finding. Osteoarthritis of both  hips.  DG Left hand complete: Evaluation of the digits is limited due to flexed positioning. No fracture or dislocation. Mild osteoarthritis of the thumb and index finger metacarpal phalangeal joints with joint space narrowing and osteophytes. The joint spaces are otherwise maintained. There are no erosions or periosteal reaction. No focal soft tissue abnormality.  DG Shoulder: Mild osteoarthritis. No evidence of inflammatory arthropathy or acute bony abnormality.  No fracture or dislocation. The alignment and joint spaces are maintained. There is mild osteoarthritis at the acromioclavicular joint. There is inferior spurring from the glenoid. No definite abnormal soft tissue calcifications.  IMPRESSION: Acromioclavicular and glenohumeral osteoarthritis. No acute bony abnormality.  She was seen in the ED in February for shoulder pain and limitations in movement. She was referred to Orthopaedics. Per notes, She also has hx of leg pain, knee pain, and hip pain which has been attributed to diabetic neuropathy and arthritis in the past. Has unbalance. CXR hip showed OA of both hips and mild joint space narrowing and osteophytes. SI joint was normal. Lumbar spine, degeneration disc And facet disease onf L3-L4, L5-S1. 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 Exam noted Patient's legs are cool to the touch. Bilateral ABI was within normal limits. Dxed with likely peripheral neuropathy from her diabetes and possible decreased arterial blood flow to the legs.  HgbA1c 6.8 06/2014  Review of Systems: Patient complains of symptoms per HPI as well as the following symptoms: appetite change, weight change, rash, walking difficulty. Pertinent negatives per HPI. All others  negative.   History   Social History  . Marital Status: Single    Spouse Name: N/A  . Number of Children: 3  . Years of Education: 12   Occupational History  . Unemployed    Social History Main Topics  . Smoking status: Former Smoker -- 0.30 packs/day    Types: Cigarettes    Quit date: 04/18/2013  . Smokeless tobacco: Not on file     Comment: Smokes 5-6 cigerettes per day, 12/21/14 smokes rarely under stressful situations  . Alcohol Use: No  . Drug Use: No  . Sexual Activity: Not on file   Other Topics Concern  . Not on file   Social History Narrative   to get supplies nfrom [facility] per pt request.1--[phone]   Lives at home with son.    Right handed.   Caffeine use: drinks coffee-rare    Family History  Problem Relation Age of Onset  . Diabetes Mother   . Heart disease Mother   . Prostate cancer Father   . Neuropathy Neg Hx     Past Medical History  Diagnosis Date  . Hypertension     well controlled  . DJD (degenerative joint disease)   . HLD (hyperlipidemia) 2008    stable, well controlled  . DM (diabetes mellitus) 2008    stable HgBA1C at 6.5  . GERD (gastroesophageal reflux disease)     well controlled on Omeprazole  . Chronic headaches   .  CHF (congestive heart failure)     Past Surgical History  Procedure Laterality Date  . Tubal ligation    . Carpel tunnel Right     20 yrs ago    Current Outpatient Prescriptions  Medication Sig Dispense Refill  . aspirin 81 MG EC tablet Take 1 tablet (81 mg total) by mouth daily. 90 tablet 1  . Calcium-Vitamin D 600-125 MG-UNIT TABS Take 1 tablet by mouth daily. 90 each 1  . naproxen (NAPROSYN) 500 MG tablet Take 1 tablet (500 mg total) by mouth 2 (two) times daily. 180 tablet 3  . potassium chloride SA (K-DUR,KLOR-CON) 20 MEQ tablet Take 1 tablet (20 mEq total) by mouth daily. 90 tablet 3  . simvastatin (ZOCOR) 40 MG tablet Take 1 tablet (40 mg total) by mouth daily. 90 tablet 3  . B-D ULTRA-FINE 33  LANCETS MISC Use for checking blood sugar, 3 times per day. Diagnosis Code is 250.00 (Patient not taking: Reported on 02/08/2015) 100 each 11  . enalapril (VASOTEC) 20 MG tablet Take 1 tablet (20 mg total) by mouth 2 (two) times daily. (Patient not taking: Reported on 03/01/2015) 180 tablet 3  . glucose blood (BAYER CONTOUR TEST) test strip [employer]. Use to test blood sugar 3 times a day. (Patient not taking: Reported on 02/08/2015) 100 each 11   No current facility-administered medications for this visit.    Allergies as of 03/01/2015 - Review Complete 03/01/2015  Allergen Reaction Noted  . Loratadine-pseudoephedrine er      Vitals: BP 109/73 mmHg  Pulse 92  Temp(Src) 98.3 F (36.8 C) (Oral)  Ht 5' 3.5" (1.613 m)  Wt 174 lb 9.6 oz (79.198 kg)  BMI 30.44 kg/m2 Last Weight:  Wt Readings from Last 1 Encounters:  03/01/15 174 lb 9.6 oz (79.198 kg)   Last Height:   Ht Readings from Last 1 Encounters:  03/01/15 5' 3.5" (1.613 m)   Gen: NAD, conversant, well nourised, obese, well groomed  CV: RRR, no MRG. No Carotid Bruits. No peripheral edema, warm, nontender Eyes: Conjunctivae clear without exudates or hemorrhage  Neuro: Detailed Neurologic Exam  Speech:  Speech is normal; fluent and spontaneous with normal comprehension.  Cognition:  The patient is oriented to person, place, and time;   recent and remote memory intact;   language fluent;   normal attention, concentration,   fund of knowledge Cranial Nerves:  The pupils are equal, round, and reactive to light. The fundi are flat. Visual fields are full to finger confrontation. Extraocular movements are intact. Trigeminal sensation is intact and the muscles of mastication are normal. The face is symmetric. The palate elevates in the midline. Hearing intact. Voice is normal. Shoulder shrug is normal. The tongue has normal motion without fasciculations.   Coordination:  No dysmetria with  right arm and legs.   Gait:  Wide based, slow  Motor Observation: assymetry of the left lower extremitiy without edema, left ankle larger circumference, some muscle wasting.  Decreased ROM left shoulder Swelling in the left hand, tender to touch Held in finger flexion at pip and dip, biceps and wrist Discoloration left hand, shiny skin Contracture at left elbow (chronic)  Tone:  Increased in the hand vs patient protecting  Increased left leg > right leg  Posture:  Posture is mildly stooped   Strength:  Left > right hip flexion weakness, left arm prox weakness vs. Shoulder pain   Sensation: right leg decreased pp to mid calf Left leg decreased pp to the ankle  Vibration 3 seconds on the left and 5 second on the right at thegreat toes Proprioception intact bilaterally Decreased temp to below knees bilaterally    Reflex Exam:  DTR's: Brick biceps, patellars and achilles for age and medical conditions   Toes:  Left toe upgoing  Clonus:  Clonus is absent.        Assessment/Plan: Ms. Carbone is a 59 yo woman with PMHx of HTN, systolic CHF, GERD, Type 2 DM, and hyperlipidemia with 4-5 months of progressive weakness, left hand pain and paresthesias, unintentional weight loss, ataxia. Exam with decreased sensation distally, weakness, increased tone in the lower extremities with left upgoing toe, very brisk reflexes for age and medical conditions. Concerning for upper motor neuron lesion. 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   Extensive workup to date negative. MRI of the brain unremarkable.    Emg/ncs of all 4 limbs MRi of the cervical cord and thoracic cord due to LE increased tone Will check CK, cmp today and recheck HIV(false positive at last appt)  Sarina Ill, MD  [facility] [address] [place], [place] [address]  Phone [phone] Fax [phone]  A total of 30 minutes was spent face-to-face  with this patient. Over half this time was spent on counseling patient on the left-sided weakness, gait ataxia, upper motor neuron signs diagnosis and different diagnostic and therapeutic options available.

## 2015-03-01 NOTE — Patient Instructions (Signed)
Overall you are doing fairly well but I do want to suggest a few things today:   Remember to drink plenty of fluid, eat healthy meals and do not skip any meals. Try to eat protein with a every meal and eat a healthy snack such as fruit or nuts in between meals. Try to keep a regular sleep-wake schedule and try to exercise daily, particularly in the form of walking, 20-30 minutes a day, if you can.   As far as diagnostic testing: EMG/NCS, MRI of the cervical and thoracic cords, labs  I would like to see you back in 4 weeks, sooner if we need to. Please call us with any interim questions, concerns, problems, updates or refill requests.   Please also call us for any test results so we can go over those with you on the phone.  My clinical assistant and will answer any of your questions and relay your messages to me and also relay most of my messages to you.   Our phone number is [phone]. We also have an after hours call service for urgent matters and there is a physician on-call for urgent questions. For any emergencies you know to call 911 or go to the nearest emergency room

## 2015-03-02 ENCOUNTER — Telehealth: Payer: Self-pay | Admitting: Internal Medicine

## 2015-03-02 DIAGNOSIS — I10 Essential (primary) hypertension: Secondary | ICD-10-CM | POA: Diagnosis not present

## 2015-03-02 DIAGNOSIS — E119 Type 2 diabetes mellitus without complications: Secondary | ICD-10-CM | POA: Diagnosis not present

## 2015-03-02 DIAGNOSIS — I509 Heart failure, unspecified: Secondary | ICD-10-CM | POA: Diagnosis not present

## 2015-03-02 DIAGNOSIS — M150 Primary generalized (osteo)arthritis: Secondary | ICD-10-CM | POA: Diagnosis not present

## 2015-03-02 DIAGNOSIS — R270 Ataxia, unspecified: Secondary | ICD-10-CM | POA: Diagnosis not present

## 2015-03-02 DIAGNOSIS — K219 Gastro-esophageal reflux disease without esophagitis: Secondary | ICD-10-CM | POA: Diagnosis not present

## 2015-03-02 NOTE — Telephone Encounter (Signed)
Spoke with patient in regards to Neurology appointment. She states referral to a specialist at a tertiary center being deferred until further work up completed (MRI cervical/thoracic spine, lab work). Will continue to follow results.

## 2015-03-02 NOTE — Progress Notes (Signed)
Internal Medicine Clinic Attending  Case discussed with Dr. Rivet soon after the resident saw the patient.  We reviewed the resident's history and exam and pertinent patient test results.  I agree with the assessment, diagnosis, and plan of care documented in the resident's note.  

## 2015-03-05 ENCOUNTER — Other Ambulatory Visit: Payer: Self-pay | Admitting: Internal Medicine

## 2015-03-05 DIAGNOSIS — I509 Heart failure, unspecified: Secondary | ICD-10-CM | POA: Diagnosis not present

## 2015-03-05 DIAGNOSIS — I10 Essential (primary) hypertension: Secondary | ICD-10-CM | POA: Diagnosis not present

## 2015-03-05 DIAGNOSIS — M150 Primary generalized (osteo)arthritis: Secondary | ICD-10-CM | POA: Diagnosis not present

## 2015-03-05 DIAGNOSIS — K219 Gastro-esophageal reflux disease without esophagitis: Secondary | ICD-10-CM | POA: Diagnosis not present

## 2015-03-05 DIAGNOSIS — E119 Type 2 diabetes mellitus without complications: Secondary | ICD-10-CM | POA: Diagnosis not present

## 2015-03-05 DIAGNOSIS — R270 Ataxia, unspecified: Secondary | ICD-10-CM | POA: Diagnosis not present

## 2015-03-05 LAB — CK: CK TOTAL: 67 U/L (ref 24–173)

## 2015-03-05 LAB — RNA QUALITATIVE: HIV 1 RNA Qualitative: NEGATIVE

## 2015-03-05 LAB — COMPREHENSIVE METABOLIC PANEL
ALBUMIN: 4.3 g/dL (ref 3.5–5.5)
ALK PHOS: 69 IU/L (ref 39–117)
ALT: 12 IU/L (ref 0–32)
AST: 16 IU/L (ref 0–40)
Albumin/Globulin Ratio: 1.7 (ref 1.1–2.5)
BILIRUBIN TOTAL: 0.5 mg/dL (ref 0.0–1.2)
BUN/Creatinine Ratio: 20 (ref 9–23)
BUN: 12 mg/dL (ref 6–24)
CALCIUM: 9.5 mg/dL (ref 8.7–10.2)
CHLORIDE: 105 mmol/L (ref 97–108)
CO2: 19 mmol/L (ref 18–29)
Creatinine, Ser: 0.6 mg/dL (ref 0.57–1.00)
GFR calc non Af Amer: 102 mL/min/{1.73_m2} (ref >59)
GFR, EST AFRICAN AMERICAN: 117 mL/min/{1.73_m2} (ref >59)
GLUCOSE: 117 mg/dL — AB (ref 65–99)
Globulin, Total: 2.6 g/dL (ref 1.5–4.5)
POTASSIUM: 4.7 mmol/L (ref 3.5–5.2)
Sodium: 144 mmol/L (ref 134–144)
TOTAL PROTEIN: 6.9 g/dL (ref 6.0–8.5)

## 2015-03-05 LAB — HIV 1/2 AB DIFFERENTIATION
HIV 1 Ab: NEGATIVE
HIV 2 Ab: NEGATIVE

## 2015-03-05 LAB — HIV-1 RNA ULTRAQUANT REFLEX TO GENTYP+: HIV1 RNA # SerPl PCR: <20 copies/mL

## 2015-03-05 LAB — HIV ANTIBODY (ROUTINE TESTING W REFLEX): HIV Screen 4th Generation wRfx: REACTIVE — AB

## 2015-03-06 ENCOUNTER — Telehealth: Payer: Self-pay | Admitting: *Deleted

## 2015-03-06 DIAGNOSIS — K219 Gastro-esophageal reflux disease without esophagitis: Secondary | ICD-10-CM | POA: Diagnosis not present

## 2015-03-06 DIAGNOSIS — I10 Essential (primary) hypertension: Secondary | ICD-10-CM | POA: Diagnosis not present

## 2015-03-06 DIAGNOSIS — E119 Type 2 diabetes mellitus without complications: Secondary | ICD-10-CM | POA: Diagnosis not present

## 2015-03-06 DIAGNOSIS — M150 Primary generalized (osteo)arthritis: Secondary | ICD-10-CM | POA: Diagnosis not present

## 2015-03-06 DIAGNOSIS — R270 Ataxia, unspecified: Secondary | ICD-10-CM | POA: Diagnosis not present

## 2015-03-06 DIAGNOSIS — I509 Heart failure, unspecified: Secondary | ICD-10-CM | POA: Diagnosis not present

## 2015-03-06 NOTE — Telephone Encounter (Signed)
Spoke w/ pt about lab results. Told her labs were WNL. HIV came back positive again but again with negative confirmatory tests, it was a false positive. Dr. Jaynee Eagles will discuss with her on 03/15/15 when she comes for EMG/NCS. She verbalized understanding.

## 2015-03-06 NOTE — Telephone Encounter (Signed)
-----   Message from Melvenia Beam, MD sent at 03/05/2015  9:59 AM EDT ----- Let patient know the new labs we did were within normal limits. The HIV came back positive again but again with negative confirmatory tests so false positive. I can discuss it all with them at the emg/ncs. thanks

## 2015-03-07 DIAGNOSIS — K219 Gastro-esophageal reflux disease without esophagitis: Secondary | ICD-10-CM | POA: Diagnosis not present

## 2015-03-07 DIAGNOSIS — I509 Heart failure, unspecified: Secondary | ICD-10-CM | POA: Diagnosis not present

## 2015-03-07 DIAGNOSIS — R270 Ataxia, unspecified: Secondary | ICD-10-CM | POA: Diagnosis not present

## 2015-03-07 DIAGNOSIS — I10 Essential (primary) hypertension: Secondary | ICD-10-CM | POA: Diagnosis not present

## 2015-03-07 DIAGNOSIS — M150 Primary generalized (osteo)arthritis: Secondary | ICD-10-CM | POA: Diagnosis not present

## 2015-03-07 DIAGNOSIS — E119 Type 2 diabetes mellitus without complications: Secondary | ICD-10-CM | POA: Diagnosis not present

## 2015-03-07 NOTE — Telephone Encounter (Signed)
Open error 

## 2015-03-08 DIAGNOSIS — E119 Type 2 diabetes mellitus without complications: Secondary | ICD-10-CM | POA: Diagnosis not present

## 2015-03-08 DIAGNOSIS — I10 Essential (primary) hypertension: Secondary | ICD-10-CM | POA: Diagnosis not present

## 2015-03-08 DIAGNOSIS — M150 Primary generalized (osteo)arthritis: Secondary | ICD-10-CM | POA: Diagnosis not present

## 2015-03-08 DIAGNOSIS — K219 Gastro-esophageal reflux disease without esophagitis: Secondary | ICD-10-CM | POA: Diagnosis not present

## 2015-03-08 DIAGNOSIS — R270 Ataxia, unspecified: Secondary | ICD-10-CM | POA: Diagnosis not present

## 2015-03-08 DIAGNOSIS — I509 Heart failure, unspecified: Secondary | ICD-10-CM | POA: Diagnosis not present

## 2015-03-09 DIAGNOSIS — E119 Type 2 diabetes mellitus without complications: Secondary | ICD-10-CM | POA: Diagnosis not present

## 2015-03-09 DIAGNOSIS — I10 Essential (primary) hypertension: Secondary | ICD-10-CM | POA: Diagnosis not present

## 2015-03-09 DIAGNOSIS — K219 Gastro-esophageal reflux disease without esophagitis: Secondary | ICD-10-CM | POA: Diagnosis not present

## 2015-03-09 DIAGNOSIS — M150 Primary generalized (osteo)arthritis: Secondary | ICD-10-CM | POA: Diagnosis not present

## 2015-03-09 DIAGNOSIS — I509 Heart failure, unspecified: Secondary | ICD-10-CM | POA: Diagnosis not present

## 2015-03-09 DIAGNOSIS — R270 Ataxia, unspecified: Secondary | ICD-10-CM | POA: Diagnosis not present

## 2015-03-12 DIAGNOSIS — R270 Ataxia, unspecified: Secondary | ICD-10-CM | POA: Diagnosis not present

## 2015-03-12 DIAGNOSIS — K219 Gastro-esophageal reflux disease without esophagitis: Secondary | ICD-10-CM | POA: Diagnosis not present

## 2015-03-12 DIAGNOSIS — E119 Type 2 diabetes mellitus without complications: Secondary | ICD-10-CM | POA: Diagnosis not present

## 2015-03-12 DIAGNOSIS — I10 Essential (primary) hypertension: Secondary | ICD-10-CM | POA: Diagnosis not present

## 2015-03-12 DIAGNOSIS — M150 Primary generalized (osteo)arthritis: Secondary | ICD-10-CM | POA: Diagnosis not present

## 2015-03-12 DIAGNOSIS — I509 Heart failure, unspecified: Secondary | ICD-10-CM | POA: Diagnosis not present

## 2015-03-13 ENCOUNTER — Encounter (INDEPENDENT_AMBULATORY_CARE_PROVIDER_SITE_OTHER): Payer: Medicare Other | Admitting: Diagnostic Neuroimaging

## 2015-03-13 ENCOUNTER — Ambulatory Visit (INDEPENDENT_AMBULATORY_CARE_PROVIDER_SITE_OTHER): Payer: Self-pay

## 2015-03-13 ENCOUNTER — Ambulatory Visit (INDEPENDENT_AMBULATORY_CARE_PROVIDER_SITE_OTHER): Payer: Medicare Other

## 2015-03-13 DIAGNOSIS — W19XXXA Unspecified fall, initial encounter: Secondary | ICD-10-CM

## 2015-03-13 DIAGNOSIS — R531 Weakness: Secondary | ICD-10-CM

## 2015-03-13 DIAGNOSIS — R252 Cramp and spasm: Secondary | ICD-10-CM | POA: Diagnosis not present

## 2015-03-13 DIAGNOSIS — R270 Ataxia, unspecified: Secondary | ICD-10-CM

## 2015-03-13 DIAGNOSIS — R768 Other specified abnormal immunological findings in serum: Secondary | ICD-10-CM | POA: Diagnosis not present

## 2015-03-13 DIAGNOSIS — R29898 Other symptoms and signs involving the musculoskeletal system: Secondary | ICD-10-CM | POA: Diagnosis not present

## 2015-03-13 DIAGNOSIS — G1220 Motor neuron disease, unspecified: Secondary | ICD-10-CM

## 2015-03-13 DIAGNOSIS — Z0289 Encounter for other administrative examinations: Secondary | ICD-10-CM

## 2015-03-13 DIAGNOSIS — Z789 Other specified health status: Secondary | ICD-10-CM

## 2015-03-14 ENCOUNTER — Telehealth: Payer: Self-pay | Admitting: Internal Medicine

## 2015-03-14 DIAGNOSIS — I509 Heart failure, unspecified: Secondary | ICD-10-CM | POA: Diagnosis not present

## 2015-03-14 DIAGNOSIS — I10 Essential (primary) hypertension: Secondary | ICD-10-CM | POA: Diagnosis not present

## 2015-03-14 DIAGNOSIS — M150 Primary generalized (osteo)arthritis: Secondary | ICD-10-CM | POA: Diagnosis not present

## 2015-03-14 DIAGNOSIS — K219 Gastro-esophageal reflux disease without esophagitis: Secondary | ICD-10-CM | POA: Diagnosis not present

## 2015-03-14 DIAGNOSIS — R270 Ataxia, unspecified: Secondary | ICD-10-CM | POA: Diagnosis not present

## 2015-03-14 DIAGNOSIS — E119 Type 2 diabetes mellitus without complications: Secondary | ICD-10-CM | POA: Diagnosis not present

## 2015-03-14 NOTE — Telephone Encounter (Signed)
Calling wanting to speak to someone about FMLA paper work for him for taking care of his mother. Says that the status of her condition has changed so things will need to be different on this paperwork.

## 2015-03-14 NOTE — Telephone Encounter (Signed)
Called and spoke w/ son, he states his company will fax paperwork to clinic today, i will return his call and let him know when it has been placed in dr rivet's box He states he would like for the FMLA to be dated to start 03/06/2015 He states the neurologist told him that pt may need to be referred to a neurom[facility] specialist at [facility] else, he thinks that dr rivet needs to do the referral, i informed him that more than likely the neurologist will do the referral, he would like to confirm that at the next neurologist appt and will call [facility] back if needed Mr Feeser would like dr rivet to call him at [phone] asap

## 2015-03-15 ENCOUNTER — Telehealth: Payer: Self-pay | Admitting: Internal Medicine

## 2015-03-15 ENCOUNTER — Ambulatory Visit (INDEPENDENT_AMBULATORY_CARE_PROVIDER_SITE_OTHER): Payer: Medicare Other | Admitting: Neurology

## 2015-03-15 DIAGNOSIS — N889 Noninflammatory disorder of cervix uteri, unspecified: Secondary | ICD-10-CM

## 2015-03-15 DIAGNOSIS — G1220 Motor neuron disease, unspecified: Secondary | ICD-10-CM | POA: Diagnosis not present

## 2015-03-15 DIAGNOSIS — G1221 Amyotrophic lateral sclerosis: Secondary | ICD-10-CM

## 2015-03-15 DIAGNOSIS — G373 Acute transverse myelitis in demyelinating disease of central nervous system: Secondary | ICD-10-CM

## 2015-03-15 DIAGNOSIS — G1229 Other motor neuron disease: Secondary | ICD-10-CM

## 2015-03-15 DIAGNOSIS — R270 Ataxia, unspecified: Secondary | ICD-10-CM | POA: Diagnosis not present

## 2015-03-15 DIAGNOSIS — S14119A Complete lesion at unspecified level of cervical spinal cord, initial encounter: Secondary | ICD-10-CM

## 2015-03-15 DIAGNOSIS — M150 Primary generalized (osteo)arthritis: Secondary | ICD-10-CM | POA: Diagnosis not present

## 2015-03-15 DIAGNOSIS — I509 Heart failure, unspecified: Secondary | ICD-10-CM | POA: Diagnosis not present

## 2015-03-15 DIAGNOSIS — K219 Gastro-esophageal reflux disease without esophagitis: Secondary | ICD-10-CM | POA: Diagnosis not present

## 2015-03-15 DIAGNOSIS — R258 Other abnormal involuntary movements: Secondary | ICD-10-CM

## 2015-03-15 DIAGNOSIS — E119 Type 2 diabetes mellitus without complications: Secondary | ICD-10-CM | POA: Diagnosis not present

## 2015-03-15 DIAGNOSIS — G0489 Other myelitis: Secondary | ICD-10-CM

## 2015-03-15 DIAGNOSIS — I10 Essential (primary) hypertension: Secondary | ICD-10-CM | POA: Diagnosis not present

## 2015-03-15 DIAGNOSIS — R252 Cramp and spasm: Secondary | ICD-10-CM

## 2015-03-15 MED ORDER — BACLOFEN 10 MG PO TABS: 10.0000 mg | ORAL_TABLET | Freq: Three times a day (TID) | ORAL | Status: DC

## 2015-03-15 MED ORDER — OXYCODONE HCL 10 MG PO TABS: 10.0000 mg | ORAL_TABLET | Freq: Four times a day (QID) | ORAL | Status: DC | PRN

## 2015-03-15 MED ORDER — METHYLPREDNISOLONE 4 MG PO TBPK: ORAL_TABLET | ORAL | Status: DC

## 2015-03-15 NOTE — Telephone Encounter (Signed)
Dr Arcelia Jew on vacation. Return next week. Had seen pt for ataxia so might be able to fill out paperwork but will allow her to decide.

## 2015-03-15 NOTE — Telephone Encounter (Signed)
Dr. Jaynee Eagles from [facility] called today to discuss results from Julie Elliott's MRI.  Dr. Arcelia Jew is out of town.   MRI reviewed with Dr. 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  Dr. Jaynee Eagles will proceed with malignancy work up. Differential also includes MS.    I will send this message to PCP Dr. Arcelia Jew.    Gilles Chiquito, MD

## 2015-03-16 DIAGNOSIS — E119 Type 2 diabetes mellitus without complications: Secondary | ICD-10-CM | POA: Diagnosis not present

## 2015-03-16 DIAGNOSIS — R270 Ataxia, unspecified: Secondary | ICD-10-CM | POA: Diagnosis not present

## 2015-03-16 DIAGNOSIS — I10 Essential (primary) hypertension: Secondary | ICD-10-CM | POA: Diagnosis not present

## 2015-03-16 DIAGNOSIS — M150 Primary generalized (osteo)arthritis: Secondary | ICD-10-CM | POA: Diagnosis not present

## 2015-03-16 DIAGNOSIS — K219 Gastro-esophageal reflux disease without esophagitis: Secondary | ICD-10-CM | POA: Diagnosis not present

## 2015-03-16 DIAGNOSIS — I509 Heart failure, unspecified: Secondary | ICD-10-CM | POA: Diagnosis not present

## 2015-03-18 ENCOUNTER — Other Ambulatory Visit: Payer: Self-pay | Admitting: Neurology

## 2015-03-18 ENCOUNTER — Telehealth: Payer: Self-pay | Admitting: Neurology

## 2015-03-18 DIAGNOSIS — C7951 Secondary malignant neoplasm of bone: Secondary | ICD-10-CM

## 2015-03-18 NOTE — Progress Notes (Addendum)
[name]    Provider:  Dr Jaynee Eagles Referring Provider: [name], MD Primary Care Physician:  [name], MD  Provider: Dr Jaynee Eagles Referring Provider: [name], MD Primary Care Physician: [name], MD  CC: Progressive gait ataxia  Interval update 03/15/2015: Julie Elliott is a 59 y.o. female here as a referral from Dr. Arcelia Jew for progressive gait ataxia, left hand pain and weakness. Julie Elliott is a 59yo woman with PMHx of HTN, systolic CHF, GERD, Type 2 DM, and hyperlipidemia who presents today for the following: Progressive spasticity, weakness, falls, painful paresthesias unintentional significant weight loss decreased mobility. She was scheduled for an EMG nerve conduction today however MRI of the cervical cord today came back significant for T2 hyperintense lesion extending from C4-5 to inferior C7 level with focal spinal cord enhancement at C5-6 level. The spinal cord appears focally enlarged at the C6 level. May represent focal autoimmune, inflammatory, demyelinating, neoplastic or para-infectious etiologies. There is adjacent degenerative disc disease with mild spinal stenosis, and therefore compressive etiology may be considered. However the degree of abnormal cord signal appears to be out of proportion to the degree of compression, and therefore would consider workup of other etiologies.  Discussed at length with patient and son and further workup.  Interval update 03/01/2015: Julie Elliott is a 59 y.o. female here as a follow up. Here with her son.   She is still losing weight. She lost 12-14 pounds in a 2 week period. She is losing muscle mass now. She can't stand up. She can't walk for a long period of time. Her PT and OT was discontinued. Patient reports that they told her they were wasting her time. No incontinence. No problems swallowing. No PBA. No weakness in facial muscles. No muscle cramping. Still with left-sided arm pain and weakness. Continuous  pain. Stiffness in the legs.   Initial visit 12/21/2014: Julie Elliott is a 59 y.o. female here as a referral from Dr. Arcelia Jew for progressive gait ataxia, left hand pain and weakness. Julie Elliott is a 59yo woman with PMHx of HTN, systolic CHF, GERD, Type 2 DM, and hyperlipidemia who presents today for the following:  She is having problems with the left hand. She has joint pain and out of nowhere the hand locked up. Stiffness, arthritic-type pain. Doesn't stop when she sleeps, the pain is continuous all day lone. Progressing and worsening. Left leg is weak. Also burning in the left leg. And coldness in both legs. They were in the ED January first, she felt like she couldn't move. Her legs were cold. She has lost a lot of weight quickly, over 100 pounds in the last 6 months. She eats the same, good appetite. she was told that her symptoms were related to arthritis and diabetic neuropathy. One month later they were in the ED again, decreased mobility and ataxia. Now she needs a cane to walk. Issues with balance and near falls. She has developed a frozen shoulder. Progressive, incapaciting pain 10/10 with difficulty performing daily tasks such as cleaning, toileting, bathing, cooking. Symptoms are mostly on the left side but her right leg is now affected. Assymmetric. Right leg slightly involved. Weakness as well as pain mostly on the left. She gets very hot. The left hand symptoms were acute, the other symptoms have been insidious and have slowly progressed. She has to have someone bathe her now. Balance is poor. She has not fallen but very off balance. No injuries, no inciting factors. She had stoped  taking her insulin in December because of her weight loss she no longer needs it. Son is resent with her and provides much of the information.   EMG/NCS showed severe CTS. Had emg/ncs at [facility] orthopa[facility].   Reviewed notes, labs and imaging from outside physicians, which showed:  DG Lumbar spine (reviewed  images and agree with the following) : No evidence for fracture. Loss of disc height is seen at L3-4 and L5-S1 advanced facet osteoarthritis is seen bilaterally in the lower lumbar levels. No worrisome lytic or sclerotic osseous abnormality.  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  DG Cervical Spine: The cervicothoracic junction is partially obscured by osseous and soft tissue overlap on the lateral view. Cervical spine alignment is maintained. Vertebral body heights are preserved. There is disc space narrowing at C4-C5, C5-C6, and C6-C7 with associated endplate spurs. There is bony neural foraminal narrowing at C5-C6 bilaterally. The dens is intact. Posterior elements appear well-aligned. There is no evidence of fracture. No prevertebral soft tissue edema.  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 Bony neural foraminal narrowing at C5-C6. No acute bony abnormality.  DG Hip: There is no evidence of fracture. There is osteoarthritis of both hips with mild joint space narrowing and marginal osteophytes. Sacroiliac joints appear normal. The patient has a tendency towards calcification at tendon insertions.  IMPRESSION: No acute finding. Osteoarthritis of both hips.  DG Left hand complete: Evaluation of the digits is limited due to flexed positioning. No fracture or dislocation. Mild osteoarthritis of the thumb and index finger metacarpal phalangeal joints with joint space narrowing and osteophytes. The joint spaces are otherwise maintained. There are no erosions or periosteal reaction. No focal soft tissue abnormality.  DG Shoulder: Mild osteoarthritis. No evidence of inflammatory arthropathy or acute bony abnormality.  No fracture or dislocation. The alignment and joint spaces are maintained. There is mild osteoarthritis at the acromioclavicular joint. There is inferior spurring from the glenoid. No  definite abnormal soft tissue calcifications.  IMPRESSION: Acromioclavicular and glenohumeral osteoarthritis. No acute bony abnormality.  She was seen in the ED in February for shoulder pain and limitations in movement. She was referred to Orthopaedics. Per notes, She also has hx of leg pain, knee pain, and hip pain which has been attributed to diabetic neuropathy and arthritis in the past. Has unbalance. CXR hip showed OA of both hips and mild joint space narrowing and osteophytes. SI joint was normal. Lumbar spine, degeneration disc And facet disease onf L3-L4, L5-S1. 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 Exam noted Patient's legs are cool to the touch. Bilateral ABI was within normal limits. Dxed with likely peripheral neuropathy from her diabetes and possible decreased arterial blood flow to the legs.  HgbA1c 6.8 06/2014  Review of Systems: Patient complains of symptoms per HPI as well as the following symptoms: appetite change, weight change, rash, walking difficulty. Pertinent negatives per HPI. All others negative.   Social History   Social History  . Marital Status: Single    Spouse Name: N/A  . Number of Children: 3  . Years of Education: 12   Occupational History  . Unemployed    Social History Main Topics  . Smoking status: Former Smoker -- 0.30 packs/day    Types: Cigarettes  Quit date: 04/18/2013  . Smokeless tobacco: Not on file     Comment: Smokes 5-6 cigerettes per day, 12/21/14 smokes rarely under stressful situations  . Alcohol Use: No  . Drug Use: No  . Sexual Activity: Not on file   Other Topics Concern  . Not on file   Social History Narrative   to get supplies nfrom [facility] per pt request.1--[phone]   Lives at home with son.     Right handed.   Caffeine use: drinks coffee-rare    Family History  Problem Relation Age of Onset  . Diabetes Mother   . Heart disease Mother   . Prostate cancer Father   . Neuropathy Neg Hx     Past Medical History  Diagnosis Date  . Hypertension     well controlled  . DJD (degenerative joint disease)   . HLD (hyperlipidemia) 2008    stable, well controlled  . DM (diabetes mellitus) 2008    stable HgBA1C at 6.5  . GERD (gastroesophageal reflux disease)     well controlled on Omeprazole  . Chronic headaches   . CHF (congestive heart failure)     Past Surgical History  Procedure Laterality Date  . Tubal ligation    . Carpel tunnel Right     20 yrs ago    Current Outpatient Prescriptions  Medication Sig Dispense Refill  . aspirin 81 MG EC tablet Take 1 tablet (81 mg total) by mouth daily. 90 tablet 1  . B-D ULTRA-FINE 33 LANCETS MISC Use for checking blood sugar, 3 times per day. Diagnosis Code is 250.00 (Patient not taking: Reported on 02/08/2015) 100 each 11  . baclofen (LIORESAL) 10 MG tablet Take 1 tablet (10 mg total) by mouth 3 (three) times daily. 90 each 11  . Calcium-Vitamin D 600-125 MG-UNIT TABS Take 1 tablet by mouth daily. 90 each 1  . enalapril (VASOTEC) 20 MG tablet Take 1 tablet (20 mg total) by mouth 2 (two) times daily. (Patient not taking: Reported on 03/01/2015) 180 tablet 3  . glucose blood (BAYER CONTOUR TEST) test strip [employer]. Use to test blood sugar 3 times a day. (Patient not taking: Reported on 02/08/2015) 100 each 11  . methylPREDNISolone (MEDROL DOSEPAK) 4 MG TBPK tablet follow package directions 21 tablet 0  . naproxen (NAPROSYN) 500 MG tablet Take 1 tablet (500 mg total) by mouth 2 (two) times daily. 180 tablet 3  . Oxycodone HCl 10 MG TABS Take 1 tablet (10 mg total) by mouth every 6 (six) hours as needed. 45 tablet 0  . potassium chloride SA (K-DUR,KLOR-CON) 20 MEQ tablet Take 1 tablet (20 mEq total) by mouth daily. 90 tablet 3  .  simvastatin (ZOCOR) 40 MG tablet TAKE 1 TABLET ONCE DAILY. 90 tablet 4   No current facility-administered medications for this visit.    Allergies as of 03/15/2015 - Review Complete 03/01/2015  Allergen Reaction Noted  . Loratadine-pseudoephedrine er      Vitals: There were no vitals taken for this visit. Last Weight:  Wt Readings from Last 1 Encounters:  03/01/15 174 lb 9.6 oz (79.198 kg)   Last Height:   Ht Readings from Last 1 Encounters:  03/01/15 5' 3.5" (1.613 m)   Speech:  Speech is normal; fluent and spontaneous with normal comprehension.  Cognition:  The patient is oriented to person, place, and time;   recent and remote memory intact;   language fluent;   normal attention, concentration,   fund of knowledge Cranial  Nerves:  The pupils are equal, round, and reactive to light. The fundi are flat. Visual fields are full to finger confrontation. Extraocular movements are intact. Trigeminal sensation is intact and the muscles of mastication are normal. The face is symmetric. The palate elevates in the midline. Hearing intact. Voice is normal. Shoulder shrug is normal. The tongue has normal motion without fasciculations.   Coordination:  No dysmetria with right arm, difficult to perform with other extremities due to weakness  Gait:  spastic gait, slow, wheelchair assistance needed to ambulate  Motor Observation: assymetry of the left lower extremitiy without edema, left ankle larger circumference, some muscle wasting.  Decreased ROM left shoulder Swelling in the left hand, tender to touch Held in finger flexion at pip and dip, biceps and wrist Discoloration left hand, shiny skin Contracture at left elbow (chronic)  Tone: significantly increased tone left arm, increased and spastic in the lower extremities left> right leg.   Posture:  Posture is mildly stooped   Strength:  bilateral lower extremity spastic hemiparesis, left arm  spastic paresis   Sensation: right leg decreased pp to mid calf Left leg decreased pp to the ankle Vibration 3 seconds on the left and 5 second on the right at thegreat toes Proprioception intact bilaterally Decreased temp to below knees bilaterally    Reflex Exam:  DTR's: Brick biceps, patellars and achilles for age and medical conditions   Toes:  Left toe upgoing  Clonus:  Clonus is absent.        Assessment/Plan:  Aspynn Clover is a 59 y.o. female here as a referral from Dr. Arcelia Jew for progressive gait ataxia and spasticity, left hand pain and paresis, lower extremity bilateral spastic paresis. Julie Elliott is a 59yo woman with PMHx of HTN, systolic CHF, GERD, Type 2 DM, and hyperlipidemia who presents today for the following: Progressive spasticity, weakness, falls, painful paresthesias unintentional significant weight loss ,decreased mobility. Exam with decreased sensation distally, weakness necessitating wheelchair, increased tone in the lower extremities with left upgoing toe, very brisk reflexes for age and medical conditions. Concerning for upper motor neuron lesion. 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 She was scheduled for an EMG nerve conduction today however MRI of the cervical cord today came back significant for T2 hyperintense lesion extending from C4-5 to inferior C7 level with focal spinal cord enhancement at C5-6 level. The spinal cord appears focally enlarged at the C6 level. May represent focal autoimmune, inflammatory, demyelinating, neoplastic or para-infectious etiologies. There is adjacent degenerative disc disease with mild spinal stenosis, and therefore compressive etiology may be considered. However the degree of abnormal cord signal appears to be out of proportion to the degree of compression, and therefore would consider workup of other etiologies.  Differential: Intramedullary tumor, Multiple sclerosis, NMO, Transverse myelitis, Spinal cord  infarct, Viral myelitis (An extensive list of viruses can affect the spinal cord, most commonly enteroviruses, including Coxsackie; rubella, measles and mumps; and viruses in the herpes family, including Epstein-Barr, varicella-zoster, cytomegalovirus, and herpes simplex)   There is also a possibility of a  compressive etiology give the adjacent disc disease that aligns with the area of enhancement. This is less likely but will refer to Dr. Cyndy Freeze  at [facility] for evaluation.  CT of the chest/abd/pelvis and whole body PET scan to scan for malignancy, lumbar puncture, Orthopaedic referral. Extensive labs were already completed including ANA w/reflex, ANCA, ESR, ACE, HIV, RPR, paraneoplastic panel,  and others but will order NMO Abs and HTLV1/11  as well as other labs. Needs LP and possibly referral to [facility] due to significant complexicity of this case.  PATIENT'S MOBILITY IS SIGNIFICANTLY IMPAIRED DUE TO THE ABOVE. SHE NEEDS A WHEELCHAIR FOR AMBULATION AND A HOSPITAL BED DUE TO HER SPASTIC PARESIS AND INABILITY TO TRANSFER INDEPENDENTLY AND IMPAIRED MOBILITY.    Son: [phone]  Sarina Ill, MD  [facility] [address] [place], [place] [address]  Phone [phone] Fax [phone]  A total of 45 minutes was spent face-to-face with this patient. Over half this time was spent on counseling patient on the cervical cord lesion diagnosis and different diagnostic and therapeutic options available.

## 2015-03-18 NOTE — Telephone Encounter (Signed)
Seth Bake - would you be able to please get Julie Elliott scheduled for CTs ASAP please? 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 If you could place her high in the work queue and also call patient's son to let him know you are working on this, I would greatly appreciate it. She can go to [facility] imaging as I am also trying to set up a lumbar puncture there as well. Call her son at Son: [phone]   Terrence Dupont - I need to finalize the Lumbar puncture labs. Can you call and schedule her for a lumbar puncture and I will have the labs ordered. Not sure if you can coordinate with Seth Bake and have patient complete everything on the same day.   Thanks.

## 2015-03-19 ENCOUNTER — Telehealth: Payer: Self-pay | Admitting: Neurology

## 2015-03-19 ENCOUNTER — Other Ambulatory Visit: Payer: Self-pay | Admitting: Internal Medicine

## 2015-03-19 ENCOUNTER — Telehealth: Payer: Self-pay | Admitting: Internal Medicine

## 2015-03-19 ENCOUNTER — Other Ambulatory Visit: Payer: Self-pay | Admitting: Neurology

## 2015-03-19 DIAGNOSIS — I509 Heart failure, unspecified: Secondary | ICD-10-CM | POA: Diagnosis not present

## 2015-03-19 DIAGNOSIS — S14119A Complete lesion at unspecified level of cervical spinal cord, initial encounter: Secondary | ICD-10-CM

## 2015-03-19 DIAGNOSIS — K219 Gastro-esophageal reflux disease without esophagitis: Secondary | ICD-10-CM | POA: Diagnosis not present

## 2015-03-19 DIAGNOSIS — G1229 Other motor neuron disease: Secondary | ICD-10-CM | POA: Insufficient documentation

## 2015-03-19 DIAGNOSIS — M150 Primary generalized (osteo)arthritis: Secondary | ICD-10-CM | POA: Diagnosis not present

## 2015-03-19 DIAGNOSIS — G1221 Amyotrophic lateral sclerosis: Secondary | ICD-10-CM

## 2015-03-19 DIAGNOSIS — G373 Acute transverse myelitis in demyelinating disease of central nervous system: Secondary | ICD-10-CM

## 2015-03-19 DIAGNOSIS — E119 Type 2 diabetes mellitus without complications: Secondary | ICD-10-CM | POA: Diagnosis not present

## 2015-03-19 DIAGNOSIS — R270 Ataxia, unspecified: Secondary | ICD-10-CM | POA: Diagnosis not present

## 2015-03-19 DIAGNOSIS — R252 Cramp and spasm: Secondary | ICD-10-CM

## 2015-03-19 DIAGNOSIS — I10 Essential (primary) hypertension: Secondary | ICD-10-CM | POA: Diagnosis not present

## 2015-03-19 DIAGNOSIS — G1220 Motor neuron disease, unspecified: Secondary | ICD-10-CM

## 2015-03-19 DIAGNOSIS — N889 Noninflammatory disorder of cervix uteri, unspecified: Secondary | ICD-10-CM | POA: Insufficient documentation

## 2015-03-19 DIAGNOSIS — G959 Disease of spinal cord, unspecified: Secondary | ICD-10-CM

## 2015-03-19 NOTE — Telephone Encounter (Signed)
Spoke with son about FMLA paperwork. He states Dr. Jaynee Eagles has agreed to fill out paperwork as patient will have a lot of appointments with her in the next few months. I expressed that I am fine with that and if he needs anything from me/[facility] to please let us know.

## 2015-03-19 NOTE — Telephone Encounter (Signed)
Faxed order for LP to [facility] imaging 03/19/15 at [phone] to [place]. Received fax confirmation.

## 2015-03-19 NOTE — Telephone Encounter (Signed)
Patient is scheduled to have both CTs on Tuesday 03/20/15 @ 12:10 PM @ [facility]. [facility] will call the patient to inform her of contrast instructions.

## 2015-03-19 NOTE — Telephone Encounter (Signed)
Julie Elliott i tried calling. Got an answering machine. I approve this. Please place the order for me. Thank you!

## 2015-03-19 NOTE — Telephone Encounter (Signed)
Julie Elliott - I also need cytology for Julie. Arwood on the lumbar puncture. I wasn't sure how to order that but that is important as this could be cancerous. Would you discuss that with [facility] imaging asap in the morning?   I spoke to patient tonight. Julie Elliott needs a manual wheel chair and she also needs a hospital bed. Will you place the orders for me on that please and start working on it? Thanks!   Vivien Rota

## 2015-03-19 NOTE — Telephone Encounter (Signed)
Thanks Terrence Dupont - I have also placed an order for evaluation of Rolene Arbour MD at [facility]. I would like her to seen by him. Please let son know [phone] that they will need to get the MRI of the cervical cord (all of the MRIs really) on a CD and bring it with them. Woul dyou be able to get the referral there tomorrow and call and personally get them an appointment asap? Send my last note and the MRI cervical cord report.  thanks.

## 2015-03-19 NOTE — Telephone Encounter (Signed)
Marty/ [facility] [phone] called to get order changed on CAT scan to with only

## 2015-03-19 NOTE — Telephone Encounter (Signed)
Julie Elliott with [facility] is calling and would like an order from Dr. Jaynee Eagles for occupational therapy for this patient: 3 X week for 2 weeks, then 2 X week possibly thru early September.  Please call.

## 2015-03-20 ENCOUNTER — Other Ambulatory Visit (INDEPENDENT_AMBULATORY_CARE_PROVIDER_SITE_OTHER): Payer: Self-pay

## 2015-03-20 ENCOUNTER — Ambulatory Visit
Admission: RE | Admit: 2015-03-20 | Discharge: 2015-03-20 | Disposition: A | Discharge status: Home / Self Care | Payer: Medicare Other | Source: Ambulatory Visit | Attending: Neurology | Admitting: Neurology

## 2015-03-20 ENCOUNTER — Other Ambulatory Visit: Payer: Self-pay | Admitting: *Deleted

## 2015-03-20 ENCOUNTER — Encounter: Payer: Self-pay | Admitting: *Deleted

## 2015-03-20 ENCOUNTER — Other Ambulatory Visit: Payer: Self-pay | Admitting: Neurology

## 2015-03-20 DIAGNOSIS — C7951 Secondary malignant neoplasm of bone: Secondary | ICD-10-CM

## 2015-03-20 DIAGNOSIS — S14119A Complete lesion at unspecified level of cervical spinal cord, initial encounter: Secondary | ICD-10-CM | POA: Diagnosis not present

## 2015-03-20 DIAGNOSIS — G959 Disease of spinal cord, unspecified: Secondary | ICD-10-CM | POA: Diagnosis not present

## 2015-03-20 DIAGNOSIS — G0489 Other myelitis: Secondary | ICD-10-CM | POA: Diagnosis not present

## 2015-03-20 DIAGNOSIS — R252 Cramp and spasm: Secondary | ICD-10-CM

## 2015-03-20 DIAGNOSIS — N200 Calculus of kidney: Secondary | ICD-10-CM | POA: Diagnosis not present

## 2015-03-20 DIAGNOSIS — I700 Atherosclerosis of aorta: Secondary | ICD-10-CM | POA: Diagnosis not present

## 2015-03-20 DIAGNOSIS — R590 Localized enlarged lymph nodes: Secondary | ICD-10-CM | POA: Diagnosis not present

## 2015-03-20 DIAGNOSIS — E049 Nontoxic goiter, unspecified: Secondary | ICD-10-CM | POA: Diagnosis not present

## 2015-03-20 DIAGNOSIS — M5137 Other intervertebral disc degeneration, lumbosacral region: Secondary | ICD-10-CM | POA: Diagnosis not present

## 2015-03-20 DIAGNOSIS — G373 Acute transverse myelitis in demyelinating disease of central nervous system: Secondary | ICD-10-CM

## 2015-03-20 DIAGNOSIS — G1220 Motor neuron disease, unspecified: Secondary | ICD-10-CM

## 2015-03-20 DIAGNOSIS — Z0289 Encounter for other administrative examinations: Secondary | ICD-10-CM

## 2015-03-20 DIAGNOSIS — R258 Other abnormal involuntary movements: Secondary | ICD-10-CM | POA: Diagnosis not present

## 2015-03-20 DIAGNOSIS — M47896 Other spondylosis, lumbar region: Secondary | ICD-10-CM | POA: Diagnosis not present

## 2015-03-20 MED ORDER — IOPAMIDOL (ISOVUE-300) INJECTION 61%
100.0000 mL | Freq: Once | INTRAVENOUS | Status: AC | PRN
  Administered 2015-03-20: 100 mL via INTRAVENOUS

## 2015-03-20 NOTE — Progress Notes (Signed)
Called [facility] at [phone]  for pick-up lab for pt. Lab is for [facility] JCV antibody. Spoke with Jamal.  Confirmation [id number]. Printed demographic sheet, insurance, E-req and included this with lab. Placed in lock box for pick-up.

## 2015-03-20 NOTE — Telephone Encounter (Signed)
Called and spoke w/ Gerald Stabs from [facility] about referral placed for pt to see Dr. Susa Day, MD. His first available appt is not until 04/03/15 and he does not do surgery if pt would need this. They made appt for pt on Wednesday 03/21/15 at 1:30pm and will call the pt to make sure this is okay.   Dr. Jaynee Eagles is aware and okay with this.

## 2015-03-20 NOTE — Telephone Encounter (Signed)
Called and spoke with Anderson Malta from [facility] again and clarified which lab orders to do for CSF. She is not doing JCV virus. She is having lab test done here in the office. Read through which lab tests Dr. Jaynee Eagles wants and verified with her. She verbalized understanding. Pt having lab work in our office today for HTLV I+II ANTIBODIES and NEUROMYELITIS OPTICA AUTOAB, IGG , JCV. Anderson Malta is aware of this. She is going to call pt later on to schedule LP.

## 2015-03-20 NOTE — Telephone Encounter (Signed)
Sent referral for wheelchair and hospital bed request to [facility] at [phone]. Received fax confirmation.

## 2015-03-20 NOTE — Telephone Encounter (Signed)
Called and spoke w/ son to let him know we sent referral over to [facility] and appt is tomorrow at 1:30pm. He stated they already called and made them aware. Appt is okay with them. Told them [facility] imaging is also going to contact them to schedule a LP. I told him I spoke with Anderson Malta from spine services this morning about referral sent over. She had all the information they need. Told him they needed to bring MRI images on CD and can call [facility]) and request a copy to pick up at their office. He verbalized understanding. Also told him [facility] requested order for OT for 3 X week for 2 weeks, then 2 X week possibly thru early September.Told him Dr. Jaynee Eagles approved this and I am going to contact [facility]. Told him to call back with further questions.

## 2015-03-20 NOTE — Telephone Encounter (Signed)
Spoke w/ Anderson Malta from [facility] in spine services and she verified she received referral for LP to schedule pt. Went over what labs needed to be ordered under notes section in referral order. She has what she needs. Told her to call back if she needs anything else.

## 2015-03-20 NOTE — Telephone Encounter (Signed)
Called and left message for Rod Holler. Told her to fax over order for OT for Dr. Jaynee Eagles to sign and we can fax back. Told her Dr. Jaynee Eagles is out of office until Thursday and we will get it back then. Gave her fax # [phone] attn: Terrence Dupont for her to fax to. Gave pt name and DOB. Told her to call if she had further questions.

## 2015-03-21 ENCOUNTER — Telehealth: Payer: Self-pay | Admitting: Neurology

## 2015-03-21 ENCOUNTER — Telehealth: Payer: Self-pay | Admitting: *Deleted

## 2015-03-21 DIAGNOSIS — E119 Type 2 diabetes mellitus without complications: Secondary | ICD-10-CM | POA: Diagnosis not present

## 2015-03-21 DIAGNOSIS — K219 Gastro-esophageal reflux disease without esophagitis: Secondary | ICD-10-CM | POA: Diagnosis not present

## 2015-03-21 DIAGNOSIS — I509 Heart failure, unspecified: Secondary | ICD-10-CM | POA: Diagnosis not present

## 2015-03-21 DIAGNOSIS — M5002 Cervical disc disorder with myelopathy, mid-cervical region: Secondary | ICD-10-CM | POA: Diagnosis not present

## 2015-03-21 DIAGNOSIS — M5032 Other cervical disc degeneration, mid-cervical region: Secondary | ICD-10-CM | POA: Diagnosis not present

## 2015-03-21 DIAGNOSIS — I10 Essential (primary) hypertension: Secondary | ICD-10-CM | POA: Diagnosis not present

## 2015-03-21 DIAGNOSIS — R270 Ataxia, unspecified: Secondary | ICD-10-CM | POA: Diagnosis not present

## 2015-03-21 DIAGNOSIS — M150 Primary generalized (osteo)arthritis: Secondary | ICD-10-CM | POA: Diagnosis not present

## 2015-03-21 NOTE — Telephone Encounter (Signed)
Asencion Partridge, [place] at Dr. Valla Leaver office would like Dr. Jaynee Eagles to call back tomorrow after 12pm to speak with Dr. Rolena Infante about pt and plan for pt/what tests are being done. I advised her that pt has LP scheduled for 03/27/15 and she had labs done in our office yesterday (JCV, HTLV 1+2 antibodies, (EIA), bld and Neuromyelitis optica autoab, IgG). She verbalized understanding. She states Dr. Rolena Infante is in surgery all morning and will be back to the office around 12pm and would like a call ASAP. I told her I will let Dr. Jaynee Eagles know.

## 2015-03-21 NOTE — Telephone Encounter (Signed)
Beth Petty/[facility] [phone] ext [phone] called. Needs Dr. To sign papers that she faxed to [place] yesterday. Also needs office visit notes.

## 2015-03-21 NOTE — Telephone Encounter (Signed)
Called and spoke with [facility]. Advised her that Dr. Jaynee Eagles is out of office until tomorrow and I will have her sign the orders when she gets back and fax it to her. She verbalized understanding.

## 2015-03-22 ENCOUNTER — Telehealth: Payer: Self-pay | Admitting: Neurology

## 2015-03-22 ENCOUNTER — Encounter: Payer: Self-pay | Admitting: *Deleted

## 2015-03-22 ENCOUNTER — Telehealth: Payer: Self-pay | Admitting: *Deleted

## 2015-03-22 DIAGNOSIS — C7951 Secondary malignant neoplasm of bone: Secondary | ICD-10-CM

## 2015-03-22 DIAGNOSIS — R270 Ataxia, unspecified: Secondary | ICD-10-CM | POA: Diagnosis not present

## 2015-03-22 DIAGNOSIS — I10 Essential (primary) hypertension: Secondary | ICD-10-CM | POA: Diagnosis not present

## 2015-03-22 DIAGNOSIS — C490 Malignant neoplasm of connective and soft tissue of head, face and neck: Secondary | ICD-10-CM

## 2015-03-22 DIAGNOSIS — K219 Gastro-esophageal reflux disease without esophagitis: Secondary | ICD-10-CM | POA: Diagnosis not present

## 2015-03-22 DIAGNOSIS — I509 Heart failure, unspecified: Secondary | ICD-10-CM | POA: Diagnosis not present

## 2015-03-22 DIAGNOSIS — E119 Type 2 diabetes mellitus without complications: Secondary | ICD-10-CM | POA: Diagnosis not present

## 2015-03-22 DIAGNOSIS — M150 Primary generalized (osteo)arthritis: Secondary | ICD-10-CM | POA: Diagnosis not present

## 2015-03-22 LAB — NEUROMYELITIS OPTICA AUTOAB, IGG

## 2015-03-22 LAB — HTLV I+II ANTIBODIES, (EIA), BLD: HTLV I/II AB: NEGATIVE

## 2015-03-22 NOTE — Telephone Encounter (Signed)
I spoke to son on the phone and reviewed results of CT of the chest/abd/pelvis and lab results. LP scheduled for next Tuesday. thanks

## 2015-03-22 NOTE — Progress Notes (Signed)
Faxed signed orders by Dr. Jaynee Eagles for manual wheelchair and hospital bed to [facility] at [phone] [facility]). Received fax confirmation.

## 2015-03-22 NOTE — Telephone Encounter (Signed)
Patient son wants to know if they need to make a follow up regarding all the test that she had done this week. And also if you need to talk to him about any of the FMLA paper work. The best number to contact is [phone]

## 2015-03-22 NOTE — Telephone Encounter (Signed)
Spoke to [place], gave Dr. Rolena Infante my cell number. Explained the MRi reading mentioned possible spinal cord compression by disk so wanted that evaluated. This is unlikely cause of the cord lesion but family wanted confirmation

## 2015-03-22 NOTE — Telephone Encounter (Signed)
Spoke to patient's son, images results as below. Also spoke to Dr. Jerene Pitch who recommended thorough workup and lymphoma screening. He does think a cervical acdf may be beneficial to patient but like myself feels there may be another etiology for the lesion and wants a full workup before proceeding. Relayed Dr. Rolena Infante' opinion to the son.   Discussed Dr. Rolena Infante' concern for lymphoma Terrence Dupont, please ensure [facility] has cytology as one of the csf studies ordered.   IMPRESSION: 1. 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 2. The 2-3 mm right upper lobe subpleural lymph node, unchanged from 09/05/2014. This documents 6 months of stability. If the patient is at high risk for bronchogenic carcinoma, follow-up chest CT in 6 months is recommended. If the patient is at low risk, no follow-up is needed. This recommendation follows the consensus statement: Guidelines for Management of Small Pulmonary Nodules Detected on CT Scans: A Statement from the [place] as published in Radiology 2005; 237:395-400. 3. 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 4. 6 mm nonobstructive left mid kidney renal calculus. 5. Mild atherosclerosis.

## 2015-03-22 NOTE — Telephone Encounter (Signed)
Spoke to chilon, no paperwork rec'd, informed pt, he will have the paperwork faxed again

## 2015-03-22 NOTE — Telephone Encounter (Signed)
Left another message for Julie Elliott. Asked her to fax order or OT. Dr Jaynee Eagles will sign off on it. She is okay with it. Told her I left a message the other day and wanted to make sure she got message. Gave pt name and DOB. Gave [facility] phone number and fax number.

## 2015-03-22 NOTE — Telephone Encounter (Signed)
Patient form from [facility] on [place] desk.

## 2015-03-22 NOTE — Telephone Encounter (Signed)
The dates he needs on the FMLA should start August 2.

## 2015-03-23 DIAGNOSIS — Z0289 Encounter for other administrative examinations: Secondary | ICD-10-CM

## 2015-03-26 ENCOUNTER — Telehealth: Payer: Self-pay | Admitting: Neurology

## 2015-03-26 DIAGNOSIS — R270 Ataxia, unspecified: Secondary | ICD-10-CM | POA: Diagnosis not present

## 2015-03-26 DIAGNOSIS — I509 Heart failure, unspecified: Secondary | ICD-10-CM | POA: Diagnosis not present

## 2015-03-26 DIAGNOSIS — K219 Gastro-esophageal reflux disease without esophagitis: Secondary | ICD-10-CM | POA: Diagnosis not present

## 2015-03-26 DIAGNOSIS — I10 Essential (primary) hypertension: Secondary | ICD-10-CM | POA: Diagnosis not present

## 2015-03-26 DIAGNOSIS — M150 Primary generalized (osteo)arthritis: Secondary | ICD-10-CM | POA: Diagnosis not present

## 2015-03-26 DIAGNOSIS — E119 Type 2 diabetes mellitus without complications: Secondary | ICD-10-CM | POA: Diagnosis not present

## 2015-03-26 NOTE — Telephone Encounter (Signed)
Spoke with Rod Holler. Asked her about  OT order I asked to be faxed. She had received the couple messages prior and tried calling back this past Friday, but our office had closed. She apologized. She stated she is going to make sure order gets faxed to [phone]. Fax should be sent soon. Told her to call back if she needed anything further. She verbalized understanding.

## 2015-03-26 NOTE — Telephone Encounter (Signed)
Spoke w/ Anderson Malta and verified that she did not need to do JCV virus on pt. We already did lab work here in office for that. She verbalized understanding.

## 2015-03-26 NOTE — Telephone Encounter (Signed)
Called and spoke with Julie Elliott in spine services from [facility] and verified Cytology was part of CSF study. She verified it was and has appt for LP tomorrow, 8/23.

## 2015-03-26 NOTE — Telephone Encounter (Signed)
Anderson Malta / [facility] called needing clarification that she doesn't need to do [place] virus on patient.

## 2015-03-27 ENCOUNTER — Ambulatory Visit
Admission: RE | Admit: 2015-03-27 | Discharge: 2015-03-27 | Disposition: A | Discharge status: Home / Self Care | Payer: Medicare Other | Source: Ambulatory Visit | Attending: Neurology | Admitting: Neurology

## 2015-03-27 ENCOUNTER — Other Ambulatory Visit: Payer: Self-pay | Admitting: Neurology

## 2015-03-27 ENCOUNTER — Other Ambulatory Visit (HOSPITAL_COMMUNITY)
Admission: RE | Admit: 2015-03-27 | Discharge: 2015-03-27 | Disposition: A | Discharge status: Home / Self Care | Payer: Medicare Other | Source: Ambulatory Visit | Attending: Neurology | Admitting: Neurology

## 2015-03-27 DIAGNOSIS — R252 Cramp and spasm: Secondary | ICD-10-CM | POA: Insufficient documentation

## 2015-03-27 DIAGNOSIS — R258 Other abnormal involuntary movements: Secondary | ICD-10-CM | POA: Diagnosis not present

## 2015-03-27 DIAGNOSIS — M4712 Other spondylosis with myelopathy, cervical region: Secondary | ICD-10-CM | POA: Insufficient documentation

## 2015-03-27 DIAGNOSIS — G373 Acute transverse myelitis in demyelinating disease of central nervous system: Secondary | ICD-10-CM

## 2015-03-27 DIAGNOSIS — C760 Malignant neoplasm of head, face and neck: Secondary | ICD-10-CM | POA: Insufficient documentation

## 2015-03-27 DIAGNOSIS — G629 Polyneuropathy, unspecified: Secondary | ICD-10-CM | POA: Diagnosis not present

## 2015-03-27 DIAGNOSIS — S14119A Complete lesion at unspecified level of cervical spinal cord, initial encounter: Secondary | ICD-10-CM

## 2015-03-27 DIAGNOSIS — G0489 Other myelitis: Secondary | ICD-10-CM | POA: Diagnosis not present

## 2015-03-27 DIAGNOSIS — G1220 Motor neuron disease, unspecified: Secondary | ICD-10-CM

## 2015-03-27 LAB — HERPES SIMPLEX VIRUS(HSV) DNA BY PCR: HSV 1 DNA: NOT DETECTED

## 2015-03-27 LAB — CSF CELL COUNT WITH DIFFERENTIAL
RBC COUNT CSF: 10 uL — AB
Tube #: 3
WBC CSF: 1 uL (ref 0–5)

## 2015-03-27 LAB — PROTEIN, CSF: Total Protein, CSF: 67 mg/dL — ABNORMAL HIGH (ref 15–45)

## 2015-03-27 LAB — GLUCOSE, CSF: GLUCOSE CSF: 60 mg/dL (ref 43–76)

## 2015-03-27 LAB — VARICELLA-ZOSTER BY PCR

## 2015-03-27 LAB — HSV DNA BY PCR (REFERENCE LAB): HSV 2 DNA: NOT DETECTED

## 2015-03-27 NOTE — Discharge Instructions (Signed)

## 2015-03-27 NOTE — Telephone Encounter (Signed)
i spoke w/ Julie Elliott, he will call or fax the paperwork himself so i will close this note

## 2015-03-27 NOTE — Progress Notes (Signed)
Two SSTs and one lavendar tube of blood drawn from right [facility] space for LP labs; site unremarkable.

## 2015-03-28 DIAGNOSIS — M150 Primary generalized (osteo)arthritis: Secondary | ICD-10-CM | POA: Diagnosis not present

## 2015-03-28 DIAGNOSIS — K219 Gastro-esophageal reflux disease without esophagitis: Secondary | ICD-10-CM | POA: Diagnosis not present

## 2015-03-28 DIAGNOSIS — E119 Type 2 diabetes mellitus without complications: Secondary | ICD-10-CM | POA: Diagnosis not present

## 2015-03-28 DIAGNOSIS — R270 Ataxia, unspecified: Secondary | ICD-10-CM | POA: Diagnosis not present

## 2015-03-28 DIAGNOSIS — I10 Essential (primary) hypertension: Secondary | ICD-10-CM | POA: Diagnosis not present

## 2015-03-28 DIAGNOSIS — I509 Heart failure, unspecified: Secondary | ICD-10-CM | POA: Diagnosis not present

## 2015-03-28 LAB — CYTOMEGALOVIRUS PCR, QUALITATIVE: CMV DNA, Qual PCR: NOT DETECTED

## 2015-03-29 ENCOUNTER — Telehealth: Payer: Self-pay | Admitting: *Deleted

## 2015-03-29 ENCOUNTER — Telehealth: Payer: Self-pay

## 2015-03-29 DIAGNOSIS — R270 Ataxia, unspecified: Secondary | ICD-10-CM | POA: Diagnosis not present

## 2015-03-29 DIAGNOSIS — I10 Essential (primary) hypertension: Secondary | ICD-10-CM | POA: Diagnosis not present

## 2015-03-29 DIAGNOSIS — K219 Gastro-esophageal reflux disease without esophagitis: Secondary | ICD-10-CM | POA: Diagnosis not present

## 2015-03-29 DIAGNOSIS — M150 Primary generalized (osteo)arthritis: Secondary | ICD-10-CM | POA: Diagnosis not present

## 2015-03-29 DIAGNOSIS — E119 Type 2 diabetes mellitus without complications: Secondary | ICD-10-CM | POA: Diagnosis not present

## 2015-03-29 DIAGNOSIS — I509 Heart failure, unspecified: Secondary | ICD-10-CM | POA: Diagnosis not present

## 2015-03-29 LAB — EPSTEIN BARR VIRUS DNA, QUANT RTPCR: EBV DNA, QN PCR: <200 copies/mL

## 2015-03-29 LAB — VDRL, CSF: VDRL Quant, CSF: NONREACTIVE

## 2015-03-29 NOTE — Telephone Encounter (Signed)
Called and spoke to Amy H. And Arelia Sneddon. At [place] no pre cert needed for [id number] whole body PET Scan . Patient is scheduled for Sept. 7th at [place] at 11:30 am NPO six hours before. Patient 's son is aware. Offered patient an apt for 04-02-15 Patient could not do that time.

## 2015-03-29 NOTE — Telephone Encounter (Signed)
Called and spoke with son to find out how much time he would like to see for [facility] paperwork. He is not sure because she has so many appt. Her PCP gave her 2 days a month for 4 hours each and this was not enough. He needs more than this. Hoping to get a whole day off to help her when he needs it. I told him I will let  Dr. Jaynee Eagles and she will decide what to give them and we will let them know when paperwork is ready. He was appreciative and I told him to call back with further questions. He verbalized understanding.

## 2015-03-29 NOTE — Telephone Encounter (Signed)
Called and f/u with Rod Holler about OT order. Advised her that I have not received a fax yet for Dr. Jaynee Eagles to sign. She is going to send an email to their main office to have someone f/u on this.

## 2015-03-30 DIAGNOSIS — E119 Type 2 diabetes mellitus without complications: Secondary | ICD-10-CM | POA: Diagnosis not present

## 2015-03-30 DIAGNOSIS — I509 Heart failure, unspecified: Secondary | ICD-10-CM | POA: Diagnosis not present

## 2015-03-30 DIAGNOSIS — I10 Essential (primary) hypertension: Secondary | ICD-10-CM | POA: Diagnosis not present

## 2015-03-30 DIAGNOSIS — R270 Ataxia, unspecified: Secondary | ICD-10-CM | POA: Diagnosis not present

## 2015-03-30 DIAGNOSIS — M150 Primary generalized (osteo)arthritis: Secondary | ICD-10-CM | POA: Diagnosis not present

## 2015-03-30 DIAGNOSIS — K219 Gastro-esophageal reflux disease without esophagitis: Secondary | ICD-10-CM | POA: Diagnosis not present

## 2015-03-30 LAB — ECHOVIRUS ABS PANEL (CSF)
Echovirus Ab Type 11: <1:1 {titer}
Echovirus Ab Type 9: <1:1 {titer}

## 2015-03-30 LAB — CNS IGG SYNTHESIS RATE, CSF+BLOOD
ALBUMIN CSF: 24.5 mg/dL (ref 8.0–42.0)
Albumin, Serum(Neph): 3.7 g/dL (ref 3.5–4.9)
IGG, CSF: 3.4 mg/dL (ref 0.8–7.7)
IgG Index, CSF: 0.54 (ref <0.66)
IgG, Serum: 952 mg/dL (ref 694–1618)
MS CNS IGG SYNTHESIS RATE: -0.6 mg/(24.h) (ref <=3.3)

## 2015-03-30 LAB — VARICELLA ZOSTER IGG AND IGM
VARICELLA IGG: 2518 {index} — AB (ref <135.00)
VARICELLA ZOSTER AB IGM: 0 {ISR} (ref <0.91)

## 2015-03-30 LAB — CSF CULTURE W GRAM STAIN: Gram Stain: NONE SEEN

## 2015-03-30 LAB — CSF CULTURE: ORGANISM ID, BACTERIA: NO GROWTH

## 2015-03-30 NOTE — Telephone Encounter (Signed)
Thank you :)

## 2015-03-30 NOTE — Telephone Encounter (Signed)
Thank you! We will fill it out Monday morning.

## 2015-04-02 ENCOUNTER — Ambulatory Visit: Payer: Medicare Other

## 2015-04-02 ENCOUNTER — Telehealth: Payer: Self-pay | Admitting: Neurology

## 2015-04-02 ENCOUNTER — Telehealth: Payer: Self-pay | Admitting: Licensed Clinical Social Worker

## 2015-04-02 ENCOUNTER — Other Ambulatory Visit: Payer: Self-pay | Admitting: Neurology

## 2015-04-02 DIAGNOSIS — E119 Type 2 diabetes mellitus without complications: Secondary | ICD-10-CM | POA: Diagnosis not present

## 2015-04-02 DIAGNOSIS — I10 Essential (primary) hypertension: Secondary | ICD-10-CM | POA: Diagnosis not present

## 2015-04-02 DIAGNOSIS — K219 Gastro-esophageal reflux disease without esophagitis: Secondary | ICD-10-CM | POA: Diagnosis not present

## 2015-04-02 DIAGNOSIS — M150 Primary generalized (osteo)arthritis: Secondary | ICD-10-CM | POA: Diagnosis not present

## 2015-04-02 DIAGNOSIS — R270 Ataxia, unspecified: Secondary | ICD-10-CM | POA: Diagnosis not present

## 2015-04-02 DIAGNOSIS — G373 Acute transverse myelitis in demyelinating disease of central nervous system: Secondary | ICD-10-CM

## 2015-04-02 DIAGNOSIS — I509 Heart failure, unspecified: Secondary | ICD-10-CM | POA: Diagnosis not present

## 2015-04-02 LAB — B. BURGDORFI ANTIBODIES, CSF: Lyme Ab: NEGATIVE

## 2015-04-02 NOTE — Telephone Encounter (Signed)
Spoke to son. 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 I reviewed this with Dr. Jannifer Franklin as well as infectious disease who agree that this is unlikely. But will check VZV igg in the serum and compare.    Terrence Dupont - can you please call and see when patient can have XR of the cervical cord with flex ex completed(ordered).And while they are at [facility] can they have labs drawn? I ordered labs and xray tonight. Let me know thanks  Call son with any updates: [phone].

## 2015-04-02 NOTE — Telephone Encounter (Signed)
CSW received call from Camera operator from [employer].  Assessor inquiring about pt's ICD Transition form as pt in-home appointment today was for annual assessment.  CSW informed [employer] a transition form was not received; however, a new request was initiated in July 2016.  Assessor could not accept dx codes over the phone.  CSW pulled copy of initial new request from July 2016, pt saw PCP in July 2016.  CSW requested PCP to review and sign PCS request.

## 2015-04-03 ENCOUNTER — Ambulatory Visit
Admission: RE | Admit: 2015-04-03 | Discharge: 2015-04-03 | Disposition: A | Discharge status: Home / Self Care | Payer: Medicare Other | Source: Ambulatory Visit | Attending: Neurology | Admitting: Neurology

## 2015-04-03 ENCOUNTER — Telehealth: Payer: Self-pay | Admitting: *Deleted

## 2015-04-03 ENCOUNTER — Other Ambulatory Visit: Payer: Self-pay | Admitting: Neurology

## 2015-04-03 DIAGNOSIS — M150 Primary generalized (osteo)arthritis: Secondary | ICD-10-CM | POA: Diagnosis not present

## 2015-04-03 DIAGNOSIS — G373 Acute transverse myelitis in demyelinating disease of central nervous system: Secondary | ICD-10-CM

## 2015-04-03 DIAGNOSIS — R270 Ataxia, unspecified: Secondary | ICD-10-CM | POA: Diagnosis not present

## 2015-04-03 DIAGNOSIS — G0489 Other myelitis: Secondary | ICD-10-CM | POA: Diagnosis not present

## 2015-04-03 DIAGNOSIS — M47812 Spondylosis without myelopathy or radiculopathy, cervical region: Secondary | ICD-10-CM | POA: Diagnosis not present

## 2015-04-03 DIAGNOSIS — I509 Heart failure, unspecified: Secondary | ICD-10-CM | POA: Diagnosis not present

## 2015-04-03 DIAGNOSIS — K219 Gastro-esophageal reflux disease without esophagitis: Secondary | ICD-10-CM | POA: Diagnosis not present

## 2015-04-03 DIAGNOSIS — E119 Type 2 diabetes mellitus without complications: Secondary | ICD-10-CM | POA: Diagnosis not present

## 2015-04-03 DIAGNOSIS — I10 Essential (primary) hypertension: Secondary | ICD-10-CM | POA: Diagnosis not present

## 2015-04-03 LAB — MYELIN BASIC PROTEIN, CSF

## 2015-04-03 LAB — ALBUMIN: Albumin: 4 g/dL (ref 3.6–5.1)

## 2015-04-03 LAB — OLIGOCLONAL BANDS, CSF + SERM

## 2015-04-03 NOTE — Telephone Encounter (Signed)
Called and spoke w/ son, [name]. Advised that he can go to [address] [place], [place] and complete xray during walk-in hours of 8-5pm. She can also complete lab work while she is there. He verbalized understanding.

## 2015-04-03 NOTE — Telephone Encounter (Signed)
Called and spoke w/ Julie Elliott from [facility]. She stated pt can go to [facility] location and walk-in for xray between 8-5pm and have lab work completed as well.

## 2015-04-03 NOTE — Telephone Encounter (Signed)
Spoke w/ Vincente Liberty from [facility] and she stated she needed lab orders faxed to [phone] to draw labs. Faxed them on 04/03/15 at 12:50pm. Received fax confirmation.

## 2015-04-03 NOTE — Telephone Encounter (Signed)
Request signed by PCP and faxed to [employer].

## 2015-04-03 NOTE — Telephone Encounter (Signed)
[name] called to say that [facility] can not do his Mom's blood work there.  He stated during our conversation that GI is telling him [facility] has moved down the street.  He will go there.  Just FYI. Thanks!

## 2015-04-03 NOTE — Telephone Encounter (Signed)
Thank you :)

## 2015-04-04 ENCOUNTER — Telehealth: Payer: Self-pay | Admitting: Neurology

## 2015-04-04 NOTE — Telephone Encounter (Signed)
Spoke with Caleen Jobs from [facility] and advised her that I have not received an OT order that I have spoken with Rod Holler about 4 times prior. Not sure if it has been faxed to [place] yet or not. I was told she would email main office and follow up on this and I have not heard anything back. Beth said she will contact main office and follow up on this for me.

## 2015-04-04 NOTE — Telephone Encounter (Signed)
Beth/[facility] [phone] ext [phone] called to advise they need office visit notes per [facility] guidlines to support Lightweight Wheelchair and Semi-electric hospital bed.

## 2015-04-04 NOTE — Telephone Encounter (Signed)
Called and spoke with [facility]. Asked her if a addendum note would be okay to give them to show need for wheelchair and hospital bed. Beth said this would be okay. She would like patient name, what facility need is coming from [facility]), and the date attached to last office note of when she writes need for wheelchair and hospital bed. I told her I would let Dr. Jaynee Eagles know and will fax that over when it is ready. She verbalized understanding.   Fax number: [phone]

## 2015-04-04 NOTE — Telephone Encounter (Signed)
Called Beth and left message to receive fax number to sent office notes to. Please ask what the best fax number would be if she calls back, thank you. Gave [facility] phone number for her to call back with fax number.

## 2015-04-05 ENCOUNTER — Telehealth: Payer: Self-pay | Admitting: *Deleted

## 2015-04-05 DIAGNOSIS — I509 Heart failure, unspecified: Secondary | ICD-10-CM | POA: Diagnosis not present

## 2015-04-05 DIAGNOSIS — K219 Gastro-esophageal reflux disease without esophagitis: Secondary | ICD-10-CM | POA: Diagnosis not present

## 2015-04-05 DIAGNOSIS — E119 Type 2 diabetes mellitus without complications: Secondary | ICD-10-CM | POA: Diagnosis not present

## 2015-04-05 DIAGNOSIS — R270 Ataxia, unspecified: Secondary | ICD-10-CM | POA: Diagnosis not present

## 2015-04-05 DIAGNOSIS — M150 Primary generalized (osteo)arthritis: Secondary | ICD-10-CM | POA: Diagnosis not present

## 2015-04-05 DIAGNOSIS — I10 Essential (primary) hypertension: Secondary | ICD-10-CM | POA: Diagnosis not present

## 2015-04-05 LAB — COPPER, SERUM: Copper: 119 ug/dL (ref 70–175)

## 2015-04-05 LAB — CERULOPLASMIN: CERULOPLASMIN: 28 mg/dL (ref 18–53)

## 2015-04-05 NOTE — Telephone Encounter (Signed)
I fax patient form to [employer] on 04/05/15.

## 2015-04-06 LAB — IGG SUBCLASSES
IGG SUBCLASS 2: 296 mg/dL (ref 241–700)
IGG SUBCLASS 3: 65 mg/dL (ref 22–178)
IGG SUBCLASS 4: 14.9 mg/dL (ref 4.0–86.0)
IgG Subclass 1: 483 mg/dL (ref 382–929)

## 2015-04-06 LAB — VARICELLA ZOSTER IGG AND IGM
VARICELLA IGG: 2910 {index} — AB (ref <135.00)
Varicella Zoster Ab IgM: 0 {ISR} (ref <0.91)

## 2015-04-06 NOTE — Telephone Encounter (Signed)
Julie Elliott - I need an appointment with patient and son next week. I need to review everything to date with them.  I may add them on Friday the 9th if they can make it. I can room them myself. Can they come in at 9am please?  Also, they need to set up their next appointment with Dr. Rolena Infante, I spoke with him and I think they need to see him again. But I want to see them first. Can you call son please? Thank you!

## 2015-04-10 ENCOUNTER — Other Ambulatory Visit: Payer: Self-pay | Admitting: Internal Medicine

## 2015-04-10 ENCOUNTER — Telehealth: Payer: Self-pay

## 2015-04-10 DIAGNOSIS — R270 Ataxia, unspecified: Secondary | ICD-10-CM | POA: Diagnosis not present

## 2015-04-10 DIAGNOSIS — I10 Essential (primary) hypertension: Secondary | ICD-10-CM | POA: Diagnosis not present

## 2015-04-10 DIAGNOSIS — K219 Gastro-esophageal reflux disease without esophagitis: Secondary | ICD-10-CM | POA: Diagnosis not present

## 2015-04-10 DIAGNOSIS — M150 Primary generalized (osteo)arthritis: Secondary | ICD-10-CM | POA: Diagnosis not present

## 2015-04-10 DIAGNOSIS — E119 Type 2 diabetes mellitus without complications: Secondary | ICD-10-CM | POA: Diagnosis not present

## 2015-04-10 DIAGNOSIS — I509 Heart failure, unspecified: Secondary | ICD-10-CM | POA: Diagnosis not present

## 2015-04-10 NOTE — Telephone Encounter (Signed)
Thank you :)

## 2015-04-10 NOTE — Telephone Encounter (Signed)
Called and spoke w/ son. Set up f/u appt w/ Dr Jaynee Eagles at 2:00pm tomorrow for check in at 1:45pm. Also advised him that Dr. Jaynee Eagles spoke w/ Dr. Rolena Infante and Dr Rolena Infante would like them to call his office and set up appt with him as well. He verbalized understanding.

## 2015-04-10 NOTE — Telephone Encounter (Signed)
Informed patient her cervical xray did not show any significant finding such as instability of the spine on flexion and extension.  Also explained that lab work would be discussed at appt 04/11/2015 @ 2p.  She verbalized understanding.

## 2015-04-11 ENCOUNTER — Ambulatory Visit: Payer: Self-pay | Admitting: Neurology

## 2015-04-11 ENCOUNTER — Other Ambulatory Visit: Payer: Self-pay | Admitting: Neurology

## 2015-04-11 ENCOUNTER — Encounter: Payer: Self-pay | Admitting: Neurology

## 2015-04-11 ENCOUNTER — Telehealth: Payer: Self-pay | Admitting: Neurology

## 2015-04-11 ENCOUNTER — Ambulatory Visit (HOSPITAL_COMMUNITY)
Admission: RE | Admit: 2015-04-11 | Discharge: 2015-04-11 | Disposition: A | Discharge status: Home / Self Care | Payer: Medicare Other | Source: Ambulatory Visit | Attending: Neurology | Admitting: Neurology

## 2015-04-11 ENCOUNTER — Ambulatory Visit (INDEPENDENT_AMBULATORY_CARE_PROVIDER_SITE_OTHER): Payer: Medicare Other | Admitting: Neurology

## 2015-04-11 VITALS — BP 115/78 | HR 104 | Temp 98.5°F | Ht 63.0 in | Wt 170.4 lb

## 2015-04-11 DIAGNOSIS — M4712 Other spondylosis with myelopathy, cervical region: Secondary | ICD-10-CM

## 2015-04-11 DIAGNOSIS — G839 Paralytic syndrome, unspecified: Secondary | ICD-10-CM

## 2015-04-11 DIAGNOSIS — C760 Malignant neoplasm of head, face and neck: Secondary | ICD-10-CM | POA: Diagnosis not present

## 2015-04-11 DIAGNOSIS — R634 Abnormal weight loss: Secondary | ICD-10-CM | POA: Diagnosis not present

## 2015-04-11 DIAGNOSIS — D492 Neoplasm of unspecified behavior of bone, soft tissue, and skin: Secondary | ICD-10-CM | POA: Insufficient documentation

## 2015-04-11 DIAGNOSIS — C539 Malignant neoplasm of cervix uteri, unspecified: Secondary | ICD-10-CM

## 2015-04-11 DIAGNOSIS — C7951 Secondary malignant neoplasm of bone: Secondary | ICD-10-CM

## 2015-04-11 DIAGNOSIS — R948 Abnormal results of function studies of other organs and systems: Secondary | ICD-10-CM | POA: Diagnosis not present

## 2015-04-11 DIAGNOSIS — R252 Cramp and spasm: Secondary | ICD-10-CM

## 2015-04-11 DIAGNOSIS — N889 Noninflammatory disorder of cervix uteri, unspecified: Secondary | ICD-10-CM

## 2015-04-11 DIAGNOSIS — G629 Polyneuropathy, unspecified: Secondary | ICD-10-CM

## 2015-04-11 DIAGNOSIS — M629 Disorder of muscle, unspecified: Secondary | ICD-10-CM

## 2015-04-11 DIAGNOSIS — R258 Other abnormal involuntary movements: Secondary | ICD-10-CM

## 2015-04-11 DIAGNOSIS — W19XXXD Unspecified fall, subsequent encounter: Secondary | ICD-10-CM

## 2015-04-11 DIAGNOSIS — G0491 Myelitis, unspecified: Secondary | ICD-10-CM

## 2015-04-11 DIAGNOSIS — C490 Malignant neoplasm of connective and soft tissue of head, face and neck: Secondary | ICD-10-CM

## 2015-04-11 LAB — GLUCOSE, CAPILLARY: Glucose-Capillary: 88 mg/dL (ref 65–99)

## 2015-04-11 MED ORDER — PREGABALIN 75 MG PO CAPS: 75.0000 mg | ORAL_CAPSULE | Freq: Two times a day (BID) | ORAL | Status: DC

## 2015-04-11 MED ORDER — FLUDEOXYGLUCOSE F - 18 (FDG) INJECTION
8.5000 | Freq: Once | INTRAVENOUS | Status: DC | PRN
  Administered 2015-04-11: 8.5 via INTRAVENOUS
  Filled 2015-04-11: qty 8.5

## 2015-04-11 NOTE — Telephone Encounter (Signed)
Arleta Creek with [facility] calling to get verbal orders for OT- 2x wk for 2wks then 1x wk for 1 wk and then 1x every 3 wks twice. She can be reached at [phone].

## 2015-04-11 NOTE — Patient Instructions (Signed)
Lrica 75mg  twice a day. Can increase to 3x a day if needed.

## 2015-04-11 NOTE — Telephone Encounter (Signed)
Called and left a message for verbal ok. Thanks.

## 2015-04-11 NOTE — Telephone Encounter (Signed)
Spoke w/ Rod Holler and they need re certification for OT  Since her current OT will end on September 10. All she needs is a verbal order. Okay to call cell at: [phone] and leave a message.

## 2015-04-11 NOTE — Telephone Encounter (Signed)
LVM returning Rod Holler call. Asked her to call back. Gave [facility] phone number and office hours.

## 2015-04-11 NOTE — Progress Notes (Signed)
[name]    Provider: Dr Jaynee Eagles Referring Provider: [name], MD Primary Care Physician: [name], MD  CC: Progressive gait ataxia  Interval update 04/15/2015: Saidy Ormand is a 59 y.o. female here as a referral from Dr. Arcelia Jew for progressive gait ataxia, left hand pain and weakness. Ms. Bowie is a 59yo woman with PMHx of HTN, systolic CHF, GERD, Type 2 DM, and hyperlipidemia who presents today for the following: Progressive spasticity, weakness, falls, painful paresthesias unintentional significant weight loss of over 100 pounds, decreased mobility. MRI of the cervical cord is significant for T2 hyperintense lesion extending from C4-C7 with focal spinal cord enhancement at C5-C6. She has gone through extensive workup and returns today to review, with son. 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 She is gone through extensive testing (see below) including a nuclear medicine PET scan whose findings were largely unremarkable except for asymmetric metabolic activity in the RIGHT vallecula region of the hypopharynx para the RIGHT (will send for direct visualization to rule out head and neck malignancy with Dr. Ernesto Rutherford), CT of the chest abdomen and pelvis were largely unremarkable and no malignancy was found. Extensive serum testing includes the following unremarkable labs: CMP, CBC, TSH, sedimentation rate, HIV with negative reflex, ANA with rheumatologic comprehensive panel, vitamin B1, heavy metals in the blood, pan-ANCA panel, multiple myeloma panel, vitamin B6, rheumatoid factor, paraneoplastic profile, high sensitivity CRP, hep C antibody, RPR,, B12 and folate, angiotensin-converting enzyme, hemoglobin A1c, CK, NMO, copper, ceruloplasmin, HTLV-1 and 2 antibodies, JCV,   CSF studies were completed which include normal myelin basic protein, positive CSF oligoclonal pans greater than 5 that  are not present in the patient's corresponding serum sample,negative Lyme antibodies, normal CNS IgG index, negative Echo virus antibodies, a high index of varus sellar zoster IgG in the CSF was found at 2518 (upper limit normal of 135) however these antibodies were also found in an higher quantity the serum and so this is not consistent with intrathecal production, negative Epstein-Barr PCR, negative VDRL, negative CMV PCR, negative HSV PCR, still pending VZV DNA PCR, fungus culture with smear in progress, cell count with differential unremarkable, normal glucose and elevated protein.   Repeat MRI cervical cord: FINDINGS: :  On sagittal images, the spine is imaged from above the cervicomedullary junction to T3T4. There is increased signal within the spinal cord from C4-C5 to C7 on T2-weighted and STIR images. There is focal enlargement of the spinal cord adjacent to C5C6 and C6. There is relatively flat enhancement within this region only adjacent to C5C6.   The vertebral bodies are normally aligned. The vertebral bodies have normal signal. The discs and interspaces were further evaluated on axial views from C2 to T1 as follows: C2 - C3: The disc and interspace appear normal. C3 - C4: There is mild uncovertebral spurring and disc bulging causing moderate bilateral foraminal narrowing. There is no definite nerve root compression, but there is some encroachment of the exiting C4 nerve roots. C4 - C5: There is mild bilateral uncovertebral spurring and imaging causing severe bilateral foraminal narrowing. There could be compression of either of the C5 nerve roots. Posteriorly within the spinal cord, there is hyperintense signal on T2 images extending inferiorly.  C5 - C6: There is moderate spinal stenosis due to broad disc protrusion, uncovertebral spurring and facet hypertrophy. There is severe left and very severe right foraminal narrowing leading to right C6 and  possible left C6 nerve  root compression. The entire spinal cord is hyperintense C6 - C7: There is broad disc protrusion and facet hypertrophy causing severe left and moderate right foraminal narrowing. There is possible left C7 nerve root compression. Abnormal signal is seen in the entire spinal cord at the interspace and medially adjacent to the C7 vertebral body.  C7 - T1: The disc and interspace appear normal. Hyperintense signal is noted medially and anteriorly only, within the spinal cord T1 - T2: The disc and interspace appear normal.   Compared to the MRI dated 03/13/2015, there is no definite change.   IMPRESSION: This is an abnormal MRI of the cervical spine with and without contrast showing: 1. Abnormal T2 hyperintense signal within the spinal cord from C4C5 to C7 with mild expansion of the cord adjacent to C5C6 to C6. There is "pancake-like" flat enhancement within the spinal cord only adjacent to C5C6. The findings are stable compared to 03/13/2015. 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 Inflammatory, demyelinating and neoplastic etiologies remain in the differential diagnosis 2. At [place], there is severe foraminal narrowing bilaterally that could lead to compression of either c5 nerve root. 3. At C5C6, there is moderate spinal stenosis and very severe right and severe left foraminal narrowing. There is probable right and possible left C6 nerve root compression.  4. At C6C7, there is severe left foraminal narrowing with possible left C7 nerve root compression  An extensive workup has been completed on patient without another etiology found except for possible compressive spondylosis of the spine. Workup consisted of  Nuclear medicine PET image: FINDINGS: NECK There is increased activity within the  spinal cord from the skullbase through C7. There is focal activity at C5-C6 which corresponds to the lesion on comparison MRI. No hypermetabolic cervical lymphadenopathy.   There is asymmetric metabolic activity within the RIGHT vallecula compared to the LEFT (image 24 of the fused data set). This activity is intense with SUV max equal 11.6 compared to prior wall 7.0 on the contralateral side. No discrete mass lesions identified.   Here is a focus of activity at the LEFT small first rib and clavicle junction (image 48 of fused data set) without associated finding on the CT portion.  CHEST  No hypermetabolic mediastinal or hilar nodes. 4 mm on palm lung nodule along the RIGHT oblique fissure on image (image 25 with, series 6).  ABDOMEN/PELVIS  No abnormal hypermetabolic activity within the liver, pancreas, adrenal glands, or spleen. No hypermetabolic lymph nodes in the abdomen or pelvis. Nonobstructing calculus in the LEFT kidney measures 7 mm. Uterus and ovaries are normal.  SKELETON  No focal hypermetabolic activity to suggest skeletal metastasis.  IMPRESSION: 1. Hypermetabolic focus within the spinal cord corresponds to enhancing lesion on comparison cervical MRI. Differential includes inflammatory process, infectious process, or neoplastic process.). 2. Asymmetric metabolic activity in the RIGHT vallecula region of the hypopharynx para the RIGHT. Consider direct visualization if concern for head and neck malignancy. 3. Asymmetric uptake at the junction of the first rib LEFT clavicle is likely degenerative as there is no lesion identified on comparison CT 4. Small nodule along the RIGHT oblique fissure is likely benign. If the patient is at high risk for bronchogenic carcinoma, follow-up chest CT at 1 year is recommended. If the patient is at low risk, no follow-up is  needed. This recommendation follows the consensus statement: Guidelines for Management of Small Pulmonary Nodules  Detected on CT Scans: A Statement from the [place] as published in Radiology 2005; 237:395-400.  CT ABDOMEN PELVIS FINDINGS  Hepatobiliary: Cholecystectomy.  Pancreas: Unremarkable  Spleen: Unremarkable  Adrenals/Urinary Tract: 6 mm left mid kidney nonobstructive renal calculus.  Stomach/Bowel: Small periampullary duodenal diverticulum.  Vascular/Lymphatic: Mild aortoiliac atherosclerotic calcification.  Reproductive: Unremarkable  Other: No supplemental non-categorized findings.  CT of the chest, abdomen and pelvis with contrast  Musculoskeletal: Considerable lower lumbar spondylosis and degenerative disc disease thought to be causing impingement at ultiple lumbar levels but most particularly at L5-S1 and L4-5. This appears significantly progressive compared to the exam from 09/14/2006. High density along the anterior epidural tissues at the L5 level probably from disc material or venous plexus. Spurring of both hips noted.  IMPRESSION: 1. 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 2. The 2-3 mm right upper lobe subpleural lymph node, unchanged from 09/05/2014. This documents 6 months of stability. If the patient is at high risk for bronchogenic carcinoma, follow-up chest CT in 6 months is recommended. If the patient is at low risk, no follow-up is needed. This recommendation follows the consensus statement: Guidelines for Management of Small Pulmonary Nodules Detected on CT Scans: A Statement from the [employer] as published in Radiology 2005; 237:395-400. 3. 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 4. 6 mm nonobstructive left mid kidney renal calculus. 5. Mild atherosclerosis.  MR Brain:  IMPRESSION: Abnormal MRI scan of the brain showing tiny scattered periventricular and subcortical nonspecific white matter hyperintensities  with differential discussed above. No enhancing lesions are noted.  MRI Thoracic spine; Unremarkable  Interval update 03/15/2015: Abreanna Drawdy is a 59 y.o. female here as a referral from Dr. Arcelia Jew for progressive gait ataxia, left hand pain and weakness. Ms. Cavenaugh is a 59yo woman with PMHx of HTN, systolic CHF, GERD, Type 2 DM, and hyperlipidemia who presents today for the following: Progressive spasticity, weakness, falls, painful paresthesias unintentional significant weight loss decreased mobility. She was scheduled for an EMG nerve conduction today however MRI of the cervical cord today came back significant for T2 hyperintense lesion extending from C4-5 to inferior C7 level with focal spinal cord enhancement at C5-6 level. The spinal cord appears focally enlarged at the C6 level. May represent focal autoimmune, inflammatory, demyelinating, neoplastic or para-infectious etiologies. There is adjacent degenerative disc disease with mild spinal stenosis, and therefore compressive etiology may be considered. However the degree of abnormal cord signal appears to be out of proportion to the degree of compression, and therefore would consider workup of other etiologies.  Discussed at length with patient and son and further workup.  Interval update 03/01/2015: Blue Ruggerio is a 59 y.o. female here as a follow up. Here with her son.   She is still losing weight. She lost 12-14 pounds in a 2 week period. She is losing muscle mass now. She can't stand up. She can't walk for a long period of time. Her PT and OT was discontinued. Patient reports that they told her they were wasting her time. No incontinence. No problems swallowing. No PBA. No weakness in facial muscles. No muscle cramping. Still with left-sided arm pain and weakness. Continuous pain. Stiffness in the legs.   Initial visit 12/21/2014: Allana Shrestha is a 59 y.o. female here as a referral from Dr. Arcelia Jew for  progressive gait ataxia, left hand pain and  weakness. Ms. Lizaola is a 59yo woman with PMHx of HTN, systolic CHF, GERD, Type 2 DM, and hyperlipidemia who presents today for the following:  She is having problems with the left hand. She has joint pain and out of nowhere the hand locked up. Stiffness, arthritic-type pain. Doesn't stop when she sleeps, the pain is continuous all day lone. Progressing and worsening. Left leg is weak. Also burning in the left leg. And coldness in both legs. They were in the ED January first, she felt like she couldn't move. Her legs were cold. She has lost a lot of weight quickly, over 100 pounds in the last 6 months. She eats the same, good appetite. she was told that her symptoms were related to arthritis and diabetic neuropathy. One month later they were in the ED again, decreased mobility and ataxia. Now she needs a cane to walk. Issues with balance and near falls. She has developed a frozen shoulder. Progressive, incapaciting pain 10/10 with difficulty performing daily tasks such as cleaning, toileting, bathing, cooking. Symptoms are mostly on the left side but her right leg is now affected. Assymmetric. Right leg slightly involved. Weakness as well as pain mostly on the left. She gets very hot. The left hand symptoms were acute, the other symptoms have been insidious and have slowly progressed. She has to have someone bathe her now. Balance is poor. She has not fallen but very off balance. No injuries, no inciting factors. She had stoped taking her insulin in December because of her weight loss she no longer needs it. Son is resent with her and provides much of the information.   EMG/NCS showed severe CTS. Had emg/ncs at [facility].   Reviewed notes, labs and imaging from outside physicians, which showed:  DG Lumbar spine (reviewed images and agree with the following) : No evidence for fracture. Loss of disc height is seen at L3-4 and L5-S1 advanced facet osteoarthritis is seen bilaterally in the lower  lumbar levels. No worrisome lytic or sclerotic osseous abnormality.  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  DG Cervical Spine: The cervicothoracic junction is partially obscured by osseous and soft tissue overlap on the lateral view. Cervical spine alignment is maintained. Vertebral body heights are preserved. There is disc space narrowing at C4-C5, C5-C6, and C6-C7 with associated endplate spurs. There is bony neural foraminal narrowing at C5-C6 bilaterally. The dens is intact. Posterior elements appear well-aligned. There is no evidence of fracture. No prevertebral soft tissue edema.  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 Bony neural foraminal narrowing at C5-C6. No acute bony abnormality.  DG Hip: There is no evidence of fracture. There is osteoarthritis of both hips with mild joint space narrowing and marginal osteophytes. Sacroiliac joints appear normal. The patient has a tendency towards calcification at tendon insertions.  IMPRESSION: No acute finding. Osteoarthritis of both hips.  DG Left hand complete: Evaluation of the digits is limited due to flexed positioning. No fracture or dislocation. Mild osteoarthritis of the thumb and index inger metacarpal phalangeal joints with joint space narrowing and osteophytes. The joint spaces are otherwise maintained. There are no erosions or periosteal reaction. No focal soft tissue abnormality.  DG Shoulder: Mild osteoarthritis. No evidence of inflammatory arthropathy or acute bony abnormality.  No fracture or dislocation. The alignment and joint spaces are maintained. There is mild osteoarthritis at the acromioclavicular joint. There is inferior spurring from the glenoid. No definite  abnormal soft tissue calcifications.  IMPRESSION: Acromioclavicular and glenohumeral osteoarthritis. No acute bony abnormality.  She was seen in the ED in February for shoulder pain and  limitations in movement. She was referred to Orthopaedics. Per notes, She also has hx of leg pain, knee pain, and hip pain which has been attributed to diabetic neuropathy and arthritis in the past. Has unbalance. CXR hip showed OA of both hips and mild joint space narrowing and osteophytes. SI joint was normal. Lumbar spine, degeneration disc And facet disease onf L3-L4, L5-S1. 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 Exam noted Patient's legs are cool to the touch. Bilateral ABI was within normal limits. Dxed with likely peripheral neuropathy from her diabetes and possible decreased arterial blood flow to the legs.  HgbA1c 6.8 06/2014  Review of Systems: Patient complains of symptoms per HPI as well as the following symptoms: constipation  Pertinent negatives per HPI. All others negative.    Social History   Social History  . Marital Status: Single    Spouse Name: N/A  . Number of Children: 3  . Years of Education: 12   Occupational History  . Unemployed    Social History Main Topics  . Smoking status: Former Smoker -- 0.30 packs/day    Types: Cigarettes    Quit date: 04/18/2013  . Smokeless tobacco: Not on file     Comment: Smokes 5-6 cigerettes per day, 12/21/14 smokes rarely under stressful situations  . Alcohol Use: No  . Drug Use: No  . Sexual Activity: Not on file   Other Topics Concern  . Not on file   Social History Narrative   to get supplies nfrom [facility] per pt request.1--[id number]   Lives at home with son.    Right handed.   Caffeine use: drinks coffee-rare    Family History  Problem Relation Age of Onset  . Diabetes Mother   . Heart disease Mother   . Prostate cancer Father   . Neuropathy Neg Hx     Past Medical History  Diagnosis  Date  . Hypertension     well controlled  . DJD (degenerative joint disease)   . HLD (hyperlipidemia) 2008    stable, well controlled  . DM (diabetes mellitus) 2008    stable HgBA1C at 6.5  . GERD (gastroesophageal reflux disease)     well controlled on Omeprazole  . Chronic headaches   . CHF (congestive heart failure)     Past Surgical History  Procedure Laterality Date  . Tubal ligation    . Carpel tunnel Right     20 yrs ago    Current Outpatient Prescriptions  Medication Sig Dispense Refill  . aspirin 81 MG EC tablet Take 1 tablet (81 mg total) by mouth daily. 90 tablet 1  . baclofen (LIORESAL) 10 MG tablet Take 1 tablet (10 mg total) by mouth 3 (three) times daily. 90 each 11  . Calcium-Vitamin D 600-125 MG-UNIT TABS Take 1 tablet by mouth daily. 90 each 1  . naproxen (NAPROSYN) 500 MG tablet TAKE 1 TABLET TWICE DAILY. 180 tablet 0  . Oxycodone HCl 10 MG TABS Take 1 tablet (10 mg total) by mouth every 6 (six) hours as needed. 45 tablet 0  . potassium chloride SA (K-DUR,KLOR-CON) 20 MEQ tablet  TAKE 1 TABLET ONCE DAILY. 90 tablet 1  . simvastatin (ZOCOR) 40 MG tablet TAKE 1 TABLET ONCE DAILY. 90 tablet 4   No current facility-administered medications for this visit.   Facility-Administered Medications Ordered in Other Visits  Medication Dose Route Frequency Provider Last Rate Last Dose  . fludeoxyglucose F - 18 (FDG) injection 8.5 milli Curie  8.5 milli Curie Intravenous Once PRN Medication Radiologist, MD   8.5 milli Curie at 04/11/15 1229    Allergies as of 04/11/2015 - Review Complete 03/27/2015  Allergen Reaction Noted  . Loratadine-pseudoephedrine er Hives     Vitals: BP 115/78 mmHg  Pulse 104  Temp(Src) 98.5 F (36.9 C) (Oral)  Ht _0  (1.6 m)  Wt 170 lb 6.4 oz (77.293 kg)  BMI 30.19 kg/m2 Last Weight:  Wt Readings from Last 1 Encounters:  04/11/15 170 lb 6.4 oz (77.293 kg)   Last Height:   Ht Readings from Last 1 Encounters:  04/11/15 _1  (1.6 m)         Speech:  Speech is normal; fluent and spontaneous with normal comprehension.  Cognition:  The patient is oriented to person, place, and time;   recent and remote memory intact;   language fluent;   normal attention, concentration,   fund of knowledge Cranial Nerves:  The pupils are equal, round, and reactive to light. The fundi are flat. Visual fields are full to finger confrontation. Extraocular movements are intact. Trigeminal sensation is intact and the muscles of mastication are normal. The face is symmetric. The palate elevates in the midline. Hearing intact. Voice is normal. Shoulder shrug is normal. The tongue has normal motion without fasciculations.   Coordination:  No dysmetria with right arm, difficult to perform with other extremities due to weakness  Gait:  spastic gait, slow, wheelchair assistance needed to ambulate  Motor Observation: assymetry of the left lower extremitiy without edema, left ankle larger circumference, some muscle wasting.  Decreased ROM left shoulder Swelling in the left hand, tender to touch Held in finger flexion at pip and dip, biceps and wrist Discoloration left hand, shiny skin Contracture at left elbow (chronic)  Tone: significantly increased tone left arm, increased and spastic in the lower extremities left> right leg.   Posture:  Posture is mildly stooped   Strength:  bilateral lower extremity spastic hemiparesis, left arm spastic paresis   Sensation: right leg decreased pp to mid calf Left leg decreased pp to the ankle Vibration 3 seconds on the left and 5 second on the right at thegreat toes Proprioception intact bilaterally Decreased temp to below knees bilaterally    Reflex Exam:  DTR's: Brick biceps, patellars and achilles for age and medical conditions   Toes:  Left toe upgoing  Clonus:  Clonus is absent.       Assessment/Plan: Monae Topping is a 59 y.o. female  here as a referral from Dr. Arcelia Jew for progressive gait ataxia and spasticity, left hand pain and paresis, lower extremity bilateral spastic paresis. Ms. Nash is a 59yo woman with PMHx of HTN, systolic CHF, GERD, Type 2 DM, and hyperlipidemia who presents today for the following: Progressive spasticity, weakness, falls, painful paresthesias unintentional significant weight loss of over 100 pounds ,decreased mobility. Exam with decreased sensation distally, weakness necessitating wheelchair, increased tone in the lower extremities with left upgoing toe, very brisk reflexes for age and medical conditions. Concerning for upper motor neuron lesion. 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     MRI of  the cervical cord is significant for T2 hyperintense lesion extending from C4-C7 with focal spinal cord enhancement at C5-C6. She has gone through extensive workup and returns today to review, with son. 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 She is gone through extensive testing (see below) including a nuclear medicine PET scan whose findings were largely unremarkable except for asymmetric metabolic activity in the RIGHT vallecula region of the hypopharynx para the RIGHT (will send for direct visualization to rule out head and neck malignancy with Dr. Ernesto Rutherford), CT of the chest abdomen and pelvis were largely unremarkable and no malignancy was found. Extensive serum testing includes the following unremarkable labs: CMP, CBC, TSH, sedimentation rate, HIV with negative reflex, ANA with rheumatologic comprehensive panel, vitamin B1, heavy metals in the blood, pan-ANCA panel, multiple myeloma panel, vitamin B6, rheumatoid factor, paraneoplastic profile, high sensitivity CRP, hep C antibody, RPR,, B12 and folate, angiotensin-converting enzyme, hemoglobin A1c, CK, NMO, copper, ceruloplasmin, HTLV-1 and 2 antibodies, JCV,   CSF studies were  completed which include normal myelin basic protein, positive CSF oligoclonal pans greater than 5 that are not present in the patient's corresponding serum sample,negative Lyme antibodies, normal CNS IgG index, negative Echo virus antibodies, a high index of varus sellar zoster IgG in the CSF was found at 2518 (upper limit normal of 135) however these antibodies were also found in an higher quantity the serum and so this is not consistent with intrathecal production, negative Epstein-Barr PCR, negative VDRL, negative CMV PCR, negative HSV PCR, still pending VZV DNA PCR, fungus culture with smear in progress, cell count with differential unremarkable, normal glucose and elevated protein.   Repeat MRI cervical cord: 04/13/2015: :  On sagittal images, the spine is imaged from above the cervicomedullary junction to T3T4. There is increased signal within the spinal cord from C4-C5 to C7 on T2-weighted and STIR images. There is focal enlargement of the spinal cord adjacent to C5C6 and C6. There is relatively flat enhancement within this region only adjacent to C5C6.   The vertebral bodies are normally aligned. The vertebral bodies have normal signal. The discs and interspaces were further evaluated on axial views from C2 to T1 as follows: C2 - C3: The disc and interspace appear normal. C3 - C4: There is mild uncovertebral spurring and disc bulging causing moderate bilateral foraminal narrowing. There is no definite nerve root compression, but there is some encroachment of the exiting C4 nerve roots. C4 - C5: There is mild bilateral uncovertebral spurring and imaging causing severe bilateral foraminal narrowing. There could be compression of either of the C5 nerve roots. Posteriorly within the spinal cord, there is hyperintense signal on T2 images extending inferiorly.  C5 - C6: There is moderate spinal stenosis due to broad disc protrusion, uncovertebral spurring and facet hypertrophy. There is  severe left and very severe right foraminal narrowing leading to right C6 and possible left C6 nerve root compression. The entire spinal cord is hyperintense C6 - C7: There is broad disc protrusion and facet hypertrophy causing severe left and moderate right foraminal narrowing. There is possible left C7 nerve root compression. Abnormal signal is seen in the entire spinal cord at the interspace and medially adjacent to the C7 vertebral body.  C7 - T1: The disc and interspace appear normal. Hyperintense signal is noted medially and anteriorly only, within the spinal cord T1 - T2: The disc and interspace appear normal.   Compared to  the MRI dated 03/13/2015, there is no definite change.   IMPRESSION: This is an abnormal MRI of the cervical spine with and without contrast showing: 1. Abnormal T2 hyperintense signal within the spinal cord from C4C5 to C7 with mild expansion of the cord adjacent to C5C6 to C6. There is "pancake-like" flat enhancement within the spinal cord only adjacent to C5C6. The findings are stable compared to 03/13/2015. 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 Inflammatory, demyelinating and neoplastic etiologies remain in the differential diagnosis 2. At [place], there is severe foraminal narrowing bilaterally that could lead to compression of either c5 nerve root. 3. At C5C6, there is moderate spinal stenosis and very severe right and severe left foraminal narrowing. There is probable right and possible left C6 nerve root compression.  4. At C6C7, there is severe left foraminal narrowing with possible left C7 nerve root compression  Differential: Intramedullary tumor, Multiple sclerosis, NMO, Transverse myelitis, Spinal cord infarct, Viral myelitis (An extensive list of viruses  can affect the spinal cord, most commonly enteroviruses, including Coxsackie; rubella, measles and mumps; and viruses in the herpes family, including Epstein-Barr, varicella-zoster, cytomegalovirus, and herpes simplex)   There is also a possibility of a compressive etiology give the adjacent disc disease that aligns with the area of enhancement. She has pending follow-up with Dr. Rolena Infante at [facility].  PATIENT'S MOBILITY IS SIGNIFICANTLY IMPAIRED DUE TO THE ABOVE. SHE NEEDS A WHEELCHAIR FOR AMBULATION AND A HOSPITAL BED DUE TO HER SPASTIC PARESIS AND INABILITY TO TRANSFER INDEPENDENTLY AND IMPAIRED MOBILITY.    Son: [phone]  Sarina Ill, MD  [facility] [address] [place], [place] [address]  Phone [phone] Fax [phone]  A total of 60 minutes was spent face-to-face with this patient and her son. Over half this time was spent on counseling patient on the cervical cord lesion diagnosis and different diagnostic and therapeutic options available.

## 2015-04-11 NOTE — Telephone Encounter (Signed)
Called and spoke w/ Julie Elliott who transferred me to advanced patient care and I then spoke with Julie Elliott. Julie Elliott advised that Dr. Jaynee Eagles needs to send addendum letter for reason why pt needs wheelchair and hospital bed.  "She would like patient name, what facility need is coming from [facility]), and the date attached to last office note of when she writes need for wheelchair and hospital bed. I told her I would let Dr. Jaynee Eagles know and will fax that over when it is ready. She verbalized understanding.   Fax number: [phone]"  I also asked about OT order that I was following up on and he stated that another Dr signed off on this order. I explained that I had left several messages about this and spoke to Ruth/Beth and never heard back from either. He apologized and stated this has been taken care of.

## 2015-04-12 ENCOUNTER — Encounter: Payer: Self-pay | Admitting: *Deleted

## 2015-04-12 ENCOUNTER — Ambulatory Visit (INDEPENDENT_AMBULATORY_CARE_PROVIDER_SITE_OTHER): Payer: Self-pay

## 2015-04-12 DIAGNOSIS — W19XXXD Unspecified fall, subsequent encounter: Secondary | ICD-10-CM

## 2015-04-12 DIAGNOSIS — M629 Disorder of muscle, unspecified: Secondary | ICD-10-CM | POA: Diagnosis not present

## 2015-04-12 DIAGNOSIS — G839 Paralytic syndrome, unspecified: Secondary | ICD-10-CM

## 2015-04-12 DIAGNOSIS — C539 Malignant neoplasm of cervix uteri, unspecified: Secondary | ICD-10-CM

## 2015-04-12 DIAGNOSIS — Z0289 Encounter for other administrative examinations: Secondary | ICD-10-CM

## 2015-04-12 DIAGNOSIS — G0491 Myelitis, unspecified: Secondary | ICD-10-CM

## 2015-04-12 NOTE — Progress Notes (Signed)
Faxed office note with explanation of need for wheelchair and hospital bed to [employer] at [phone] on 04/12/15. Received fax confirmation.

## 2015-04-13 DIAGNOSIS — I10 Essential (primary) hypertension: Secondary | ICD-10-CM | POA: Diagnosis not present

## 2015-04-13 DIAGNOSIS — K219 Gastro-esophageal reflux disease without esophagitis: Secondary | ICD-10-CM | POA: Diagnosis not present

## 2015-04-13 DIAGNOSIS — R270 Ataxia, unspecified: Secondary | ICD-10-CM | POA: Diagnosis not present

## 2015-04-13 DIAGNOSIS — E119 Type 2 diabetes mellitus without complications: Secondary | ICD-10-CM | POA: Diagnosis not present

## 2015-04-13 DIAGNOSIS — M150 Primary generalized (osteo)arthritis: Secondary | ICD-10-CM | POA: Diagnosis not present

## 2015-04-13 DIAGNOSIS — I509 Heart failure, unspecified: Secondary | ICD-10-CM | POA: Diagnosis not present

## 2015-04-16 ENCOUNTER — Other Ambulatory Visit: Payer: Self-pay | Admitting: Neurology

## 2015-04-16 ENCOUNTER — Telehealth: Payer: Self-pay | Admitting: Neurology

## 2015-04-16 DIAGNOSIS — R270 Ataxia, unspecified: Secondary | ICD-10-CM | POA: Diagnosis not present

## 2015-04-16 DIAGNOSIS — E119 Type 2 diabetes mellitus without complications: Secondary | ICD-10-CM | POA: Diagnosis not present

## 2015-04-16 DIAGNOSIS — E785 Hyperlipidemia, unspecified: Secondary | ICD-10-CM | POA: Diagnosis not present

## 2015-04-16 DIAGNOSIS — Z87891 Personal history of nicotine dependence: Secondary | ICD-10-CM | POA: Diagnosis not present

## 2015-04-16 DIAGNOSIS — G959 Disease of spinal cord, unspecified: Secondary | ICD-10-CM

## 2015-04-16 DIAGNOSIS — M24542 Contracture, left hand: Secondary | ICD-10-CM | POA: Diagnosis not present

## 2015-04-16 DIAGNOSIS — I10 Essential (primary) hypertension: Secondary | ICD-10-CM | POA: Diagnosis not present

## 2015-04-16 DIAGNOSIS — M79642 Pain in left hand: Secondary | ICD-10-CM | POA: Diagnosis not present

## 2015-04-16 DIAGNOSIS — M150 Primary generalized (osteo)arthritis: Secondary | ICD-10-CM | POA: Diagnosis not present

## 2015-04-16 DIAGNOSIS — M5032 Other cervical disc degeneration, mid-cervical region: Secondary | ICD-10-CM | POA: Diagnosis not present

## 2015-04-16 DIAGNOSIS — M5002 Cervical disc disorder with myelopathy, mid-cervical region: Secondary | ICD-10-CM | POA: Diagnosis not present

## 2015-04-16 DIAGNOSIS — I509 Heart failure, unspecified: Secondary | ICD-10-CM | POA: Diagnosis not present

## 2015-04-16 NOTE — Telephone Encounter (Signed)
Spoke to son, need a time frame for parts A and B on FMLA paperowkr. Terrence Dupont, can you find the paperwork please so I can correct it.   Also informed him of the results of the PET scan and repeat MRi cervical cord, will refer him to Dr. 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 Ernesto Rutherford).   Terrence Dupont will you fax my note to Dr. Rolena Infante to ensure he has it at [phone]. Will you also call Dr. Berle Mull office and ask if they could call and get her seen as soon as they can? And fax my ntoe to them as well. I will follow with a driect call to Dr, Ernesto Rutherford.

## 2015-04-16 NOTE — Telephone Encounter (Signed)
Julie Elliott - Dr. Ernesto Rutherford is out of the office for the next 2 weeks. He recommended we send patient to dr. Radene Journey who is in his office, ENT. Please include my last office visit which was last week. Thank you.   Address: [address], [place], [place] [address]  Phone: [phone]

## 2015-04-16 NOTE — Telephone Encounter (Signed)
Dr. Alyson Locket FMLA form back from medical records.to make changes.

## 2015-04-16 NOTE — Telephone Encounter (Signed)
Faxed recent office note to Dr. Ernesto Rutherford at [phone]. Received fax confirmation.

## 2015-04-17 ENCOUNTER — Other Ambulatory Visit: Payer: Self-pay | Admitting: Neurology

## 2015-04-17 ENCOUNTER — Telehealth: Payer: Self-pay | Admitting: Neurology

## 2015-04-17 DIAGNOSIS — G959 Disease of spinal cord, unspecified: Secondary | ICD-10-CM

## 2015-04-17 NOTE — Telephone Encounter (Signed)
Danielle - I would like this patient evaluated up at [facility] by both neurology and Neurosurgery. Can we get these referrals there asap?  Include my note dated 04/11/2015 which has everything needed thanks.  Terrence Dupont - when the referral gets up there, let's wait a few days then call [facility] directly and see when we can get the appointments for her.  [facility] should call the Son at [phone] to schedule

## 2015-04-17 NOTE — Telephone Encounter (Signed)
Danielle - Dr. Ernesto Rutherford is out of the office for the next 2 weeks. He recommended we send patient to dr. Radene Journey who is in his office, ENT. Please include my last office visit which was last week. Thank you.   Address: [address] [place], [place] [address]  Phone: [phone]

## 2015-04-18 ENCOUNTER — Telehealth: Payer: Self-pay | Admitting: *Deleted

## 2015-04-18 NOTE — Telephone Encounter (Signed)
Sent both referrals. [facility]- [phone]. [place]. [facility] will review and call us back to let us know when the patient can be scheduled.

## 2015-04-18 NOTE — Telephone Encounter (Signed)
Form,[place] corrections made by Dr Jaynee Eagles re faxed 04/18/15.

## 2015-04-18 NOTE — Telephone Encounter (Signed)
Spoke with Neldon Labella (outgoing referral coordinator) and she is going to work on referral.

## 2015-04-19 DIAGNOSIS — M24542 Contracture, left hand: Secondary | ICD-10-CM | POA: Diagnosis not present

## 2015-04-19 DIAGNOSIS — M79642 Pain in left hand: Secondary | ICD-10-CM | POA: Diagnosis not present

## 2015-04-19 DIAGNOSIS — I509 Heart failure, unspecified: Secondary | ICD-10-CM | POA: Diagnosis not present

## 2015-04-19 DIAGNOSIS — R270 Ataxia, unspecified: Secondary | ICD-10-CM | POA: Diagnosis not present

## 2015-04-19 DIAGNOSIS — M150 Primary generalized (osteo)arthritis: Secondary | ICD-10-CM | POA: Diagnosis not present

## 2015-04-19 DIAGNOSIS — E119 Type 2 diabetes mellitus without complications: Secondary | ICD-10-CM | POA: Diagnosis not present

## 2015-04-20 NOTE — Telephone Encounter (Signed)
Patient scheduled for Tuesday 9/20.

## 2015-04-22 LAB — FUNGUS CULTURE W SMEAR: Smear Result: NONE SEEN

## 2015-04-23 DIAGNOSIS — G959 Disease of spinal cord, unspecified: Secondary | ICD-10-CM | POA: Diagnosis not present

## 2015-04-23 DIAGNOSIS — E119 Type 2 diabetes mellitus without complications: Secondary | ICD-10-CM | POA: Diagnosis not present

## 2015-04-23 DIAGNOSIS — R270 Ataxia, unspecified: Secondary | ICD-10-CM | POA: Diagnosis not present

## 2015-04-23 DIAGNOSIS — M79642 Pain in left hand: Secondary | ICD-10-CM | POA: Diagnosis not present

## 2015-04-23 DIAGNOSIS — M4712 Other spondylosis with myelopathy, cervical region: Secondary | ICD-10-CM | POA: Diagnosis not present

## 2015-04-23 DIAGNOSIS — M24542 Contracture, left hand: Secondary | ICD-10-CM | POA: Diagnosis not present

## 2015-04-23 DIAGNOSIS — M150 Primary generalized (osteo)arthritis: Secondary | ICD-10-CM | POA: Diagnosis not present

## 2015-04-23 DIAGNOSIS — I509 Heart failure, unspecified: Secondary | ICD-10-CM | POA: Diagnosis not present

## 2015-04-24 ENCOUNTER — Telehealth: Payer: Self-pay | Admitting: *Deleted

## 2015-04-24 DIAGNOSIS — R1313 Dysphagia, pharyngeal phase: Secondary | ICD-10-CM | POA: Diagnosis not present

## 2015-04-24 NOTE — Telephone Encounter (Signed)
THANK YOU

## 2015-04-24 NOTE — Telephone Encounter (Signed)
Called and spoke to son [name] who is listed on DPR. He stated that Dr. Rigoberto Noel explained everything to [facility] and she would like to go ahead with surgery. Dr. Rigoberto Noel was able to explain things very well and even give visuals, which they both were very appreciative of. He stated that Dr. Isabell Jarvis office will be calling today or tomorrow to schedule surgery. Her surgery will be within the next 3 weeks.   They also went and saw Dr. Lucia Gaskins today and he stated he will send his office note to Dr. Jaynee Eagles. He only saw inflammation. No tumors.   Advised that Dr. Jaynee Eagles is out of the office today and she will be back tomorrow. I will give her the message. He verbalized understanding.

## 2015-04-26 DIAGNOSIS — R270 Ataxia, unspecified: Secondary | ICD-10-CM | POA: Diagnosis not present

## 2015-04-26 DIAGNOSIS — M79642 Pain in left hand: Secondary | ICD-10-CM | POA: Diagnosis not present

## 2015-04-26 DIAGNOSIS — M150 Primary generalized (osteo)arthritis: Secondary | ICD-10-CM | POA: Diagnosis not present

## 2015-04-26 DIAGNOSIS — M24542 Contracture, left hand: Secondary | ICD-10-CM | POA: Diagnosis not present

## 2015-04-26 DIAGNOSIS — E119 Type 2 diabetes mellitus without complications: Secondary | ICD-10-CM | POA: Diagnosis not present

## 2015-04-26 DIAGNOSIS — I509 Heart failure, unspecified: Secondary | ICD-10-CM | POA: Diagnosis not present

## 2015-04-26 NOTE — Telephone Encounter (Signed)
Called pt to notify her of [facility] appt. Nov. 15th at 2:15p. The son asked if this appt was necessary since they were going to see Dr. Rigoberto Noel. He is fine taking her just wanted some clarification. Please call and advise pt

## 2015-04-30 DIAGNOSIS — E119 Type 2 diabetes mellitus without complications: Secondary | ICD-10-CM | POA: Diagnosis not present

## 2015-04-30 DIAGNOSIS — M150 Primary generalized (osteo)arthritis: Secondary | ICD-10-CM | POA: Diagnosis not present

## 2015-04-30 DIAGNOSIS — I509 Heart failure, unspecified: Secondary | ICD-10-CM | POA: Diagnosis not present

## 2015-04-30 DIAGNOSIS — R270 Ataxia, unspecified: Secondary | ICD-10-CM | POA: Diagnosis not present

## 2015-04-30 DIAGNOSIS — M79642 Pain in left hand: Secondary | ICD-10-CM | POA: Diagnosis not present

## 2015-04-30 DIAGNOSIS — M24542 Contracture, left hand: Secondary | ICD-10-CM | POA: Diagnosis not present

## 2015-04-30 NOTE — Telephone Encounter (Signed)
Patient and son met with Dr. Rigoberto Noel in [place] at [facility]. They are comfortable with the diagnosis that Dr. 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 They are going forward with the surgery. 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 However we tested her extensively for any malignancies and other disorders that could cause these findings and came up negative so not sure it means anything. We will continue to follow. He acknowledges and he has an appointment with neurology at [facility], I want him to bring this up with the neurologist. Still, I completely agree she needs the surgery and that this is compressive myelopathy.He will keep me informed of the surgery date.

## 2015-05-01 NOTE — Telephone Encounter (Signed)
PCP is Dr. Arcelia Jew.  Am attaching her to this correspondence.

## 2015-05-16 DIAGNOSIS — Z01818 Encounter for other preprocedural examination: Secondary | ICD-10-CM | POA: Diagnosis not present

## 2015-05-17 ENCOUNTER — Telehealth: Payer: Self-pay | Admitting: Neurology

## 2015-05-17 DIAGNOSIS — G629 Polyneuropathy, unspecified: Secondary | ICD-10-CM

## 2015-05-17 MED ORDER — PREGABALIN 75 MG PO CAPS: 75.0000 mg | ORAL_CAPSULE | Freq: Three times a day (TID) | ORAL | Status: DC

## 2015-05-17 NOTE — Telephone Encounter (Signed)
Son [name] [phone] called to advise Mom has ran out of pregabalin (LYRICA) 75 MG capsule samples and patient either needs more samples or a Rx. Son states this medication has helped.

## 2015-05-17 NOTE — Telephone Encounter (Signed)
Called son to see if pt has been taking rx 2x/day or 3x/day. He called her to double check. She stated she is taking 75mg  2 times a day. She is okay to increase rx lyrica. Per Dr. Jaynee Eagles, she will increase to 75mg , 3 times per day. She can take 3 times a day or 2 tablets at night and one in the morning. I am going to place printed rx up front as well as samples for [name] (son) to pick up who is listed on her DPR form. He also wanted to let Dr. Jaynee Eagles know she is going to have surgery on 05/22/15 now.

## 2015-05-17 NOTE — Telephone Encounter (Signed)
Great - thanks

## 2015-05-17 NOTE — Telephone Encounter (Signed)
Terrence Dupont - can you see if she has been taking it twice a day or three times a day? Also ask if she would like to increase it before i call it in. Happy to fax a prescription or give samples and a prescription. Let me know.

## 2015-05-21 DIAGNOSIS — M79642 Pain in left hand: Secondary | ICD-10-CM | POA: Diagnosis not present

## 2015-05-21 DIAGNOSIS — E119 Type 2 diabetes mellitus without complications: Secondary | ICD-10-CM | POA: Diagnosis not present

## 2015-05-21 DIAGNOSIS — I509 Heart failure, unspecified: Secondary | ICD-10-CM | POA: Diagnosis not present

## 2015-05-21 DIAGNOSIS — M150 Primary generalized (osteo)arthritis: Secondary | ICD-10-CM | POA: Diagnosis not present

## 2015-05-21 DIAGNOSIS — R270 Ataxia, unspecified: Secondary | ICD-10-CM | POA: Diagnosis not present

## 2015-05-21 DIAGNOSIS — M24542 Contracture, left hand: Secondary | ICD-10-CM | POA: Diagnosis not present

## 2015-05-22 ENCOUNTER — Telehealth: Payer: Self-pay | Admitting: Neurology

## 2015-05-22 DIAGNOSIS — E785 Hyperlipidemia, unspecified: Secondary | ICD-10-CM | POA: Diagnosis present

## 2015-05-22 DIAGNOSIS — E119 Type 2 diabetes mellitus without complications: Secondary | ICD-10-CM | POA: Diagnosis present

## 2015-05-22 DIAGNOSIS — I10 Essential (primary) hypertension: Secondary | ICD-10-CM | POA: Diagnosis present

## 2015-05-22 DIAGNOSIS — I509 Heart failure, unspecified: Secondary | ICD-10-CM | POA: Diagnosis present

## 2015-05-22 DIAGNOSIS — Z791 Long term (current) use of non-steroidal anti-inflammatories (NSAID): Secondary | ICD-10-CM | POA: Diagnosis not present

## 2015-05-22 DIAGNOSIS — M4322 Fusion of spine, cervical region: Secondary | ICD-10-CM | POA: Diagnosis not present

## 2015-05-22 DIAGNOSIS — M24542 Contracture, left hand: Secondary | ICD-10-CM | POA: Diagnosis not present

## 2015-05-22 DIAGNOSIS — M4712 Other spondylosis with myelopathy, cervical region: Secondary | ICD-10-CM | POA: Diagnosis not present

## 2015-05-22 DIAGNOSIS — R634 Abnormal weight loss: Secondary | ICD-10-CM | POA: Diagnosis present

## 2015-05-22 DIAGNOSIS — E669 Obesity, unspecified: Secondary | ICD-10-CM | POA: Diagnosis present

## 2015-05-22 DIAGNOSIS — Z888 Allergy status to other drugs, medicaments and biological substances status: Secondary | ICD-10-CM | POA: Diagnosis not present

## 2015-05-22 DIAGNOSIS — M4802 Spinal stenosis, cervical region: Secondary | ICD-10-CM | POA: Diagnosis present

## 2015-05-22 DIAGNOSIS — Z7982 Long term (current) use of aspirin: Secondary | ICD-10-CM | POA: Diagnosis not present

## 2015-05-22 DIAGNOSIS — Z6831 Body mass index (BMI) 31.0-31.9, adult: Secondary | ICD-10-CM | POA: Diagnosis not present

## 2015-05-22 NOTE — Telephone Encounter (Signed)
Jennifer/[facility] [phone] ext [phone] called to check status of documentation that was faxed over to Dr. Jaynee Eagles 05/18/15.

## 2015-05-22 NOTE — Telephone Encounter (Signed)
Called and LVM for [facility] about pt paperwork. Advised medical records fax machine is currently down and to refax paperwork to [phone]. Gave [facility] phone number to call back if she has more questions.

## 2015-05-23 NOTE — Telephone Encounter (Signed)
Dr. Jaynee Eagles-- Received forms from [facility] for you to sign. I will fax once you review and sign. Thank you!

## 2015-05-24 ENCOUNTER — Encounter: Payer: Self-pay | Admitting: *Deleted

## 2015-05-24 DIAGNOSIS — M24542 Contracture, left hand: Secondary | ICD-10-CM

## 2015-05-24 NOTE — Telephone Encounter (Signed)
Thanks

## 2015-05-24 NOTE — Progress Notes (Signed)
Faxed signed plan of care back to [place] at [phone] to [facility]. Received fax confirmation. Copy sent to medical records.

## 2015-05-25 DIAGNOSIS — I509 Heart failure, unspecified: Secondary | ICD-10-CM | POA: Diagnosis not present

## 2015-05-25 DIAGNOSIS — M150 Primary generalized (osteo)arthritis: Secondary | ICD-10-CM | POA: Diagnosis not present

## 2015-05-25 DIAGNOSIS — M24542 Contracture, left hand: Secondary | ICD-10-CM | POA: Diagnosis not present

## 2015-05-25 DIAGNOSIS — M79642 Pain in left hand: Secondary | ICD-10-CM | POA: Diagnosis not present

## 2015-05-25 DIAGNOSIS — R270 Ataxia, unspecified: Secondary | ICD-10-CM | POA: Diagnosis not present

## 2015-05-25 DIAGNOSIS — E119 Type 2 diabetes mellitus without complications: Secondary | ICD-10-CM | POA: Diagnosis not present

## 2015-05-28 DIAGNOSIS — M79642 Pain in left hand: Secondary | ICD-10-CM | POA: Diagnosis not present

## 2015-05-28 DIAGNOSIS — I509 Heart failure, unspecified: Secondary | ICD-10-CM | POA: Diagnosis not present

## 2015-05-28 DIAGNOSIS — E119 Type 2 diabetes mellitus without complications: Secondary | ICD-10-CM | POA: Diagnosis not present

## 2015-05-28 DIAGNOSIS — M150 Primary generalized (osteo)arthritis: Secondary | ICD-10-CM | POA: Diagnosis not present

## 2015-05-28 DIAGNOSIS — M24542 Contracture, left hand: Secondary | ICD-10-CM | POA: Diagnosis not present

## 2015-05-28 DIAGNOSIS — R270 Ataxia, unspecified: Secondary | ICD-10-CM | POA: Diagnosis not present

## 2015-05-29 DIAGNOSIS — M79642 Pain in left hand: Secondary | ICD-10-CM | POA: Diagnosis not present

## 2015-05-29 DIAGNOSIS — I509 Heart failure, unspecified: Secondary | ICD-10-CM | POA: Diagnosis not present

## 2015-05-29 DIAGNOSIS — R270 Ataxia, unspecified: Secondary | ICD-10-CM | POA: Diagnosis not present

## 2015-05-29 DIAGNOSIS — E119 Type 2 diabetes mellitus without complications: Secondary | ICD-10-CM | POA: Diagnosis not present

## 2015-05-29 DIAGNOSIS — M24542 Contracture, left hand: Secondary | ICD-10-CM | POA: Diagnosis not present

## 2015-05-29 DIAGNOSIS — M150 Primary generalized (osteo)arthritis: Secondary | ICD-10-CM | POA: Diagnosis not present

## 2015-05-30 DIAGNOSIS — M150 Primary generalized (osteo)arthritis: Secondary | ICD-10-CM | POA: Diagnosis not present

## 2015-05-30 DIAGNOSIS — I509 Heart failure, unspecified: Secondary | ICD-10-CM | POA: Diagnosis not present

## 2015-05-30 DIAGNOSIS — M79642 Pain in left hand: Secondary | ICD-10-CM | POA: Diagnosis not present

## 2015-05-30 DIAGNOSIS — R270 Ataxia, unspecified: Secondary | ICD-10-CM | POA: Diagnosis not present

## 2015-05-30 DIAGNOSIS — M24542 Contracture, left hand: Secondary | ICD-10-CM | POA: Diagnosis not present

## 2015-05-30 DIAGNOSIS — E119 Type 2 diabetes mellitus without complications: Secondary | ICD-10-CM | POA: Diagnosis not present

## 2015-05-31 DIAGNOSIS — M24542 Contracture, left hand: Secondary | ICD-10-CM | POA: Diagnosis not present

## 2015-05-31 DIAGNOSIS — I509 Heart failure, unspecified: Secondary | ICD-10-CM | POA: Diagnosis not present

## 2015-05-31 DIAGNOSIS — M79642 Pain in left hand: Secondary | ICD-10-CM | POA: Diagnosis not present

## 2015-05-31 DIAGNOSIS — E119 Type 2 diabetes mellitus without complications: Secondary | ICD-10-CM | POA: Diagnosis not present

## 2015-05-31 DIAGNOSIS — R270 Ataxia, unspecified: Secondary | ICD-10-CM | POA: Diagnosis not present

## 2015-05-31 DIAGNOSIS — M150 Primary generalized (osteo)arthritis: Secondary | ICD-10-CM | POA: Diagnosis not present

## 2015-06-01 DIAGNOSIS — M24542 Contracture, left hand: Secondary | ICD-10-CM | POA: Diagnosis not present

## 2015-06-01 DIAGNOSIS — I509 Heart failure, unspecified: Secondary | ICD-10-CM | POA: Diagnosis not present

## 2015-06-01 DIAGNOSIS — M150 Primary generalized (osteo)arthritis: Secondary | ICD-10-CM | POA: Diagnosis not present

## 2015-06-01 DIAGNOSIS — R270 Ataxia, unspecified: Secondary | ICD-10-CM | POA: Diagnosis not present

## 2015-06-01 DIAGNOSIS — E119 Type 2 diabetes mellitus without complications: Secondary | ICD-10-CM | POA: Diagnosis not present

## 2015-06-01 DIAGNOSIS — M79642 Pain in left hand: Secondary | ICD-10-CM | POA: Diagnosis not present

## 2015-06-04 DIAGNOSIS — M24542 Contracture, left hand: Secondary | ICD-10-CM | POA: Diagnosis not present

## 2015-06-04 DIAGNOSIS — R270 Ataxia, unspecified: Secondary | ICD-10-CM | POA: Diagnosis not present

## 2015-06-04 DIAGNOSIS — M150 Primary generalized (osteo)arthritis: Secondary | ICD-10-CM | POA: Diagnosis not present

## 2015-06-04 DIAGNOSIS — E119 Type 2 diabetes mellitus without complications: Secondary | ICD-10-CM | POA: Diagnosis not present

## 2015-06-04 DIAGNOSIS — I509 Heart failure, unspecified: Secondary | ICD-10-CM | POA: Diagnosis not present

## 2015-06-04 DIAGNOSIS — M79642 Pain in left hand: Secondary | ICD-10-CM | POA: Diagnosis not present

## 2015-06-05 ENCOUNTER — Telehealth: Payer: Self-pay | Admitting: Licensed Clinical Social Worker

## 2015-06-05 DIAGNOSIS — Z4789 Encounter for other orthopedic aftercare: Secondary | ICD-10-CM | POA: Diagnosis not present

## 2015-06-05 DIAGNOSIS — Z981 Arthrodesis status: Secondary | ICD-10-CM | POA: Diagnosis not present

## 2015-06-05 NOTE — Telephone Encounter (Signed)
PCS request faxed to [facility] confirmed.

## 2015-06-05 NOTE — Telephone Encounter (Signed)
CSW received call from Ms. Cruzen's home health SW, Nestor Lewandowsky with [facility].  Jenny Reichmann states pt is requesting add'l PCS hours for change of medical status due to spinal surgery. After chart review, Ms. Barrientes has not had an [facility] appointment since the end of July 2016, which is over the 90 day time frame.  CSW placed call and left message with Ms. Letitia Libra stating pt will need to be seen for [facility] to move forward with a PCS medical status change request.    CSW placed call to Ms. Estey to notify patient.  CSW offered to fax request to pt's outpatient specialty physician for signature.  Pt agreeable stating she just needs a little more assistance at this time.  CSW will fax PCS change of medical status to Dr. Cathren Laine office.

## 2015-06-07 DIAGNOSIS — M79642 Pain in left hand: Secondary | ICD-10-CM | POA: Diagnosis not present

## 2015-06-07 DIAGNOSIS — R270 Ataxia, unspecified: Secondary | ICD-10-CM | POA: Diagnosis not present

## 2015-06-07 DIAGNOSIS — E119 Type 2 diabetes mellitus without complications: Secondary | ICD-10-CM | POA: Diagnosis not present

## 2015-06-07 DIAGNOSIS — M150 Primary generalized (osteo)arthritis: Secondary | ICD-10-CM | POA: Diagnosis not present

## 2015-06-07 DIAGNOSIS — M24542 Contracture, left hand: Secondary | ICD-10-CM | POA: Diagnosis not present

## 2015-06-07 DIAGNOSIS — I509 Heart failure, unspecified: Secondary | ICD-10-CM | POA: Diagnosis not present

## 2015-06-08 ENCOUNTER — Encounter: Payer: Self-pay | Admitting: *Deleted

## 2015-06-08 DIAGNOSIS — R270 Ataxia, unspecified: Secondary | ICD-10-CM | POA: Diagnosis not present

## 2015-06-08 DIAGNOSIS — E119 Type 2 diabetes mellitus without complications: Secondary | ICD-10-CM | POA: Diagnosis not present

## 2015-06-08 DIAGNOSIS — M79642 Pain in left hand: Secondary | ICD-10-CM | POA: Diagnosis not present

## 2015-06-08 DIAGNOSIS — M24542 Contracture, left hand: Secondary | ICD-10-CM | POA: Diagnosis not present

## 2015-06-08 DIAGNOSIS — M150 Primary generalized (osteo)arthritis: Secondary | ICD-10-CM | POA: Diagnosis not present

## 2015-06-08 DIAGNOSIS — I509 Heart failure, unspecified: Secondary | ICD-10-CM | POA: Diagnosis not present

## 2015-06-08 NOTE — Progress Notes (Signed)
Faxed completed form from [employer] back to them. Faxed to [phone]. Received fax confirmation.

## 2015-06-08 NOTE — Progress Notes (Signed)
Faxed signed orders back to Hilda Blades at [facility] at [phone]. Received fax confirmation. Included dx written out per Dr Jaynee Eagles last office note.

## 2015-06-11 DIAGNOSIS — M79642 Pain in left hand: Secondary | ICD-10-CM | POA: Diagnosis not present

## 2015-06-11 DIAGNOSIS — I509 Heart failure, unspecified: Secondary | ICD-10-CM | POA: Diagnosis not present

## 2015-06-11 DIAGNOSIS — M150 Primary generalized (osteo)arthritis: Secondary | ICD-10-CM | POA: Diagnosis not present

## 2015-06-11 DIAGNOSIS — E119 Type 2 diabetes mellitus without complications: Secondary | ICD-10-CM | POA: Diagnosis not present

## 2015-06-11 DIAGNOSIS — R270 Ataxia, unspecified: Secondary | ICD-10-CM | POA: Diagnosis not present

## 2015-06-11 DIAGNOSIS — M24542 Contracture, left hand: Secondary | ICD-10-CM | POA: Diagnosis not present

## 2015-06-12 DIAGNOSIS — M150 Primary generalized (osteo)arthritis: Secondary | ICD-10-CM | POA: Diagnosis not present

## 2015-06-12 DIAGNOSIS — I509 Heart failure, unspecified: Secondary | ICD-10-CM | POA: Diagnosis not present

## 2015-06-12 DIAGNOSIS — R270 Ataxia, unspecified: Secondary | ICD-10-CM | POA: Diagnosis not present

## 2015-06-12 DIAGNOSIS — M24542 Contracture, left hand: Secondary | ICD-10-CM | POA: Diagnosis not present

## 2015-06-12 DIAGNOSIS — E119 Type 2 diabetes mellitus without complications: Secondary | ICD-10-CM | POA: Diagnosis not present

## 2015-06-12 DIAGNOSIS — M79642 Pain in left hand: Secondary | ICD-10-CM | POA: Diagnosis not present

## 2015-06-13 DIAGNOSIS — M24542 Contracture, left hand: Secondary | ICD-10-CM | POA: Diagnosis not present

## 2015-06-13 DIAGNOSIS — R270 Ataxia, unspecified: Secondary | ICD-10-CM | POA: Diagnosis not present

## 2015-06-13 DIAGNOSIS — M79642 Pain in left hand: Secondary | ICD-10-CM | POA: Diagnosis not present

## 2015-06-13 DIAGNOSIS — E119 Type 2 diabetes mellitus without complications: Secondary | ICD-10-CM | POA: Diagnosis not present

## 2015-06-13 DIAGNOSIS — I509 Heart failure, unspecified: Secondary | ICD-10-CM | POA: Diagnosis not present

## 2015-06-13 DIAGNOSIS — M150 Primary generalized (osteo)arthritis: Secondary | ICD-10-CM | POA: Diagnosis not present

## 2015-06-14 ENCOUNTER — Telehealth: Payer: Self-pay | Admitting: Neurology

## 2015-06-14 ENCOUNTER — Ambulatory Visit (INDEPENDENT_AMBULATORY_CARE_PROVIDER_SITE_OTHER): Payer: Medicare Other | Admitting: Neurology

## 2015-06-14 ENCOUNTER — Encounter: Payer: Self-pay | Admitting: Neurology

## 2015-06-14 VITALS — BP 127/82 | HR 98 | Temp 97.5°F | Ht 63.0 in

## 2015-06-14 DIAGNOSIS — I509 Heart failure, unspecified: Secondary | ICD-10-CM | POA: Diagnosis not present

## 2015-06-14 DIAGNOSIS — M79642 Pain in left hand: Secondary | ICD-10-CM | POA: Diagnosis not present

## 2015-06-14 DIAGNOSIS — R270 Ataxia, unspecified: Secondary | ICD-10-CM | POA: Diagnosis not present

## 2015-06-14 DIAGNOSIS — S14105S Unspecified injury at C5 level of cervical spinal cord, sequela: Secondary | ICD-10-CM | POA: Diagnosis not present

## 2015-06-14 DIAGNOSIS — M150 Primary generalized (osteo)arthritis: Secondary | ICD-10-CM | POA: Diagnosis not present

## 2015-06-14 DIAGNOSIS — B351 Tinea unguium: Secondary | ICD-10-CM

## 2015-06-14 DIAGNOSIS — M4712 Other spondylosis with myelopathy, cervical region: Secondary | ICD-10-CM | POA: Diagnosis not present

## 2015-06-14 DIAGNOSIS — G629 Polyneuropathy, unspecified: Secondary | ICD-10-CM

## 2015-06-14 DIAGNOSIS — M24542 Contracture, left hand: Secondary | ICD-10-CM | POA: Diagnosis not present

## 2015-06-14 DIAGNOSIS — E119 Type 2 diabetes mellitus without complications: Secondary | ICD-10-CM | POA: Diagnosis not present

## 2015-06-14 MED ORDER — HYDROCODONE-ACETAMINOPHEN 10-325 MG PO TABS: 2.0000 | ORAL_TABLET | ORAL | Status: DC | PRN

## 2015-06-14 MED ORDER — PREGABALIN 75 MG PO CAPS: 75.0000 mg | ORAL_CAPSULE | Freq: Two times a day (BID) | ORAL | Status: DC

## 2015-06-14 NOTE — Patient Instructions (Addendum)
Remember to drink plenty of fluid, eat healthy meals and do not skip any meals. Try to eat protein with a every meal and eat a healthy snack such as fruit or nuts in between meals. Try to keep a regular sleep-wake schedule and try to exercise daily, particularly in the form of walking, 20-30 minutes a day, if you can.   As far as your medications are concerned, I would like to suggest: As dicussed  I would like to see you back in 3 months, sooner if we need to. Please call us with any interim questions, concerns, problems, updates or refill requests.   Will find a podiatry referral. Will refer to Dr. Laurena Spies and Drs. Read Drivers and Naaman Plummer  Our phone number is [phone]. We also have an after hours call service for urgent matters and there is a physician on-call for urgent questions. For any emergencies you know to call 911 or go to the nearest emergency room

## 2015-06-14 NOTE — Telephone Encounter (Signed)
Tilford Pillar from [facility] called and needs orders:  Twice  a week for 2 weeks, to continue on balance and strength.May call [phone]

## 2015-06-14 NOTE — Telephone Encounter (Signed)
Called Kendra back. Asked her to fax PT orders over for Dr Jaynee Eagles to sign. She stated she will fax them.

## 2015-06-14 NOTE — Progress Notes (Signed)
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 Julie Elliott is doing exceptionally well. .  The spasticity is improving and so it the pain. Julie Elliott still has balance problems but is up walking around with a cane, no falls. Have recommended baclofen for the spasticity but Julie Elliott is worried about taking it. I also recommend Rehab with Dr. Letta Pate and Naaman Plummer as well as botox for the left arm spasticity. The weight loss has stabilized. Julie Elliott needs primary care follow up. Julie Elliott needs podiatry for her toe nail problems. I am extremely pleased with her progress after surgery with Dr. Rigoberto Noel at [facility], a remarkable improvement.   Interval update 04/15/2015: Julie Elliott is a 59 y.o. female here as a referral from Dr. Arcelia Jew for progressive gait ataxia, left hand pain and weakness. Julie Elliott is a 60yo woman with PMHx of HTN, systolic CHF, GERD, Type 2 DM, and hyperlipidemia who presents today for the following: Progressive spasticity, weakness, falls, painful paresthesias unintentional significant weight loss of over 100 pounds, decreased mobility. MRI of the cervical cord is significant for T2 hyperintense lesion extending from C4-C7 with focal spinal cord enhancement at C5-C6. Julie Elliott has gone through extensive workup and returns today to review, with son. 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 Julie Elliott is gone through extensive testing (see below) including a nuclear medicine PET scan whose findings were largely unremarkable except for asymmetric metabolic activity in the RIGHT vallecula region of the hypopharynx para the RIGHT (will send for direct visualization to rule out head and neck malignancy  with Dr. Ernesto Rutherford), CT of the chest abdomen and pelvis were largely unremarkable and no malignancy was found. Extensive serum testing includes the following unremarkable labs: CMP, CBC, TSH, sedimentation rate, HIV with negative reflex, ANA with rheumatologic comprehensive panel, vitamin B1, heavy metals in the blood, pan-ANCA panel, multiple myeloma panel, vitamin B6, rheumatoid factor, paraneoplastic profile, high sensitivity CRP, hep C antibody, RPR,, B12 and folate, angiotensin-converting enzyme, hemoglobin A1c, CK, NMO, copper, ceruloplasmin, HTLV-1 and 2 antibodies, JCV,   CSF studies were completed which include normal myelin basic protein, positive CSF oligoclonal pans greater than 5 that are not present in the patient's corresponding serum sample,negative Lyme antibodies, normal CNS IgG index, negative Echo virus antibodies, a high index of varus sellar zoster IgG in the CSF was found at 2518 (upper limit normal of 135) however these antibodies were also found in an higher quantity the serum and so this is not consistent with intrathecal production, negative Epstein-Barr PCR, negative VDRL, negative CMV PCR, negative HSV PCR, still pending VZV DNA PCR, fungus culture with smear in progress, cell count with differential unremarkable, normal glucose and elevated protein.   Repeat MRI cervical cord: FINDINGS: :  On sagittal images, the spine is imaged from above the cervicomedullary junction to T3T4. There is increased signal within the spinal cord from C4-C5 to C7 on T2-weighted and STIR images. There is focal enlargement of the spinal cord adjacent to C5C6 and C6. There is relatively flat enhancement within this region only adjacent to C5C6.   The vertebral bodies are normally aligned. The vertebral bodies have normal signal. The discs and interspaces were further evaluated on axial views  from C2 to T1 as follows: C2 - C3: The disc and interspace appear normal. C3 - C4: There is mild  uncovertebral spurring and disc bulging causing moderate bilateral foraminal narrowing. There is no definite nerve root compression, but there is some encroachment of the exiting C4 nerve roots. C4 - C5: There is mild bilateral uncovertebral spurring and imaging causing severe bilateral foraminal narrowing. There could be compression of either of the C5 nerve roots. Posteriorly within the spinal cord, there is hyperintense signal on T2 images extending inferiorly.  C5 - C6: There is moderate spinal stenosis due to broad disc protrusion, uncovertebral spurring and facet hypertrophy. There is severe left and very severe right foraminal narrowing leading to right C6 and possible left C6 nerve root compression. The entire spinal cord is hyperintense C6 - C7: There is broad disc protrusion and facet hypertrophy causing severe left and moderate right foraminal narrowing. There is possible left C7 nerve root compression. Abnormal signal is seen in the entire spinal cord at the interspace and medially adjacent to the C7 vertebral body.  C7 - T1: The disc and interspace appear normal. Hyperintense signal is noted medially and anteriorly only, within the spinal cord T1 - T2: The disc and interspace appear normal.   Compared to the MRI dated 03/13/2015, there is no definite change.   IMPRESSION: This is an abnormal MRI of the cervical spine with and without contrast showing: 1. Abnormal T2 hyperintense signal within the spinal cord from C4C5 to C7 with mild expansion of the cord adjacent to C5C6 to C6. There is "pancake-like" flat enhancement within the spinal cord only adjacent to C5C6. The findings are stable compared to 03/13/2015. 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 Inflammatory, demyelinating and  neoplastic etiologies remain in the differential diagnosis 2. At [place], there is severe foraminal narrowing bilaterally that could lead to compression of either c5 nerve root. 3. At C5C6, there is moderate spinal stenosis and very severe right and severe left foraminal narrowing. There is probable right and possible left C6 nerve root compression.  4. At C6C7, there is severe left foraminal narrowing with possible left C7 nerve root compression  An extensive workup has been completed on patient without another etiology found except for possible compressive spondylosis of the spine. Workup consisted of  Nuclear medicine PET image: FINDINGS: NECK There is increased activity within the spinal cord from the skullbase through C7. There is focal activity at C5-C6 which corresponds to the lesion on comparison MRI. No hypermetabolic cervical lymphadenopathy.   There is asymmetric metabolic activity within the RIGHT vallecula compared to the LEFT (image 24 of the fused data set). This activity is intense with SUV max equal 11.6 compared to prior wall 7.0 on the contralateral side. No discrete mass lesions identified.   Here is a focus of activity at the LEFT small first rib and clavicle junction (image 48 of fused data set) without associated finding on the CT portion.  CHEST  No hypermetabolic mediastinal or hilar nodes. 4 mm on palm lung nodule along the RIGHT oblique fissure on image (image 25 with, series 6).  ABDOMEN/PELVIS  No abnormal hypermetabolic activity within the liver, pancreas, adrenal glands, or spleen. No hypermetabolic lymph nodes in the abdomen or pelvis. Nonobstructing calculus in the LEFT kidney measures 7 mm. Uterus and ovaries are normal.  SKELETON  No focal hypermetabolic activity to suggest skeletal metastasis.  IMPRESSION: 1. Hypermetabolic focus within the spinal cord corresponds to enhancing lesion on comparison cervical MRI. Differential  includes inflammatory process, infectious process, or neoplastic process.). 2. Asymmetric metabolic activity in the RIGHT vallecula region of the hypopharynx para the RIGHT. Consider direct visualization if concern for head and neck malignancy. 3. Asymmetric uptake at the junction of the first rib LEFT clavicle is likely degenerative as there is no lesion identified on comparison CT 4. Small nodule along the RIGHT oblique fissure is likely benign. If the patient is at high risk for bronchogenic carcinoma, follow-up chest CT at 1 year is recommended. If the patient is at low risk, no follow-up is needed. This recommendation follows the consensus statement: Guidelines for Management of Small Pulmonary Nodules Detected on CT Scans: A Statement from the [employer] as published in Radiology 2005; 237:395-400.  CT ABDOMEN PELVIS FINDINGS  Hepatobiliary: Cholecystectomy.  Pancreas: Unremarkable  Spleen: Unremarkable  Adrenals/Urinary Tract: 6 mm left mid kidney nonobstructive renal calculus.  Stomach/Bowel: Small periampullary duodenal diverticulum.  Vascular/Lymphatic: Mild aortoiliac atherosclerotic calcification.  Reproductive: Unremarkable  Other: No supplemental non-categorized findings.  CT of the chest, abdomen and pelvis with contrast  Musculoskeletal: Considerable lower lumbar spondylosis and degenerative disc disease thought to be causing impingement at ultiple lumbar levels but most particularly at L5-S1 and L4-5. This appears significantly progressive compared to the exam from 09/14/2006. High density along the anterior epidural tissues at the L5 level probably from disc material or venous plexus. Spurring of both hips noted.  IMPRESSION: 1. 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 2. The 2-3 mm right upper lobe subpleural lymph node, unchanged from 09/05/2014. This documents 6 months of  stability. If the patient is at high risk for bronchogenic carcinoma, follow-up chest CT in 6 months is recommended. If the patient is at low risk, no follow-up is needed. This recommendation follows the consensus statement: Guidelines for Management of Small Pulmonary Nodules Detected on CT Scans: A Statement from the [employer] as published in Radiology 2005; 237:395-400. 3. 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 4. 6 mm nonobstructive left mid kidney renal calculus. 5. Mild atherosclerosis.  MR Brain:  IMPRESSION: Abnormal MRI scan of the brain showing tiny scattered periventricular and subcortical nonspecific white matter hyperintensities with differential discussed above. No enhancing lesions are noted.  MRI Thoracic spine; Unremarkable  Interval update 03/15/2015: Julie Elliott is a 59 y.o. female here as a referral from Dr. Arcelia Jew for progressive gait ataxia, left hand pain and weakness. Julie Elliott is a 59yo woman with PMHx of HTN, systolic CHF, GERD, Type 2 DM, and hyperlipidemia who presents today for the following: Progressive spasticity, weakness, falls, painful paresthesias unintentional significant weight loss decreased mobility. Julie Elliott was scheduled for an EMG nerve conduction today however MRI of the cervical cord today came back significant for T2 hyperintense lesion extending from C4-5 to inferior C7 level with focal spinal cord enhancement at C5-6 level. The spinal cord appears focally enlarged at the C6 level. May represent focal autoimmune, inflammatory, demyelinating, neoplastic or para-infectious etiologies. There is adjacent degenerative disc disease with mild spinal stenosis, and therefore compressive etiology may be considered. However the degree of abnormal cord signal appears to be out of proportion to the degree of compression, and therefore would consider workup of other etiologies.  Discussed at length with patient  and son and further workup.  Interval update 03/01/2015: Julie Elliott is a 59 y.o. female here as a follow up. Here with her son.   Julie Elliott is still losing weight. Julie Elliott lost 12-14 pounds in a 2 week period. Julie Elliott is losing muscle mass now. Julie Elliott can't stand up. Julie Elliott can't walk for a long period of time. Her PT and OT was discontinued. Patient reports that they told her they were wasting her time. No incontinence. No problems swallowing. No PBA. No weakness in facial muscles. No muscle cramping. Still with left-sided arm pain and weakness. Continuous pain. Stiffness in the legs.   Initial visit 12/21/2014: Julie Elliott is a 59 y.o. female here as a referral from Dr. Arcelia Jew for progressive gait ataxia, left hand pain and weakness. Julie Elliott is a 59yo woman with PMHx of HTN, systolic CHF, GERD, Type 2 DM, and hyperlipidemia who presents today for the following:  Julie Elliott is having problems with the left hand. Julie Elliott has joint pain and out of nowhere the hand locked up. Stiffness, arthritic-type pain. Doesn't stop when Julie Elliott sleeps, the pain is continuous all day lone. Progressing and worsening. Left leg is weak. Also burning in the left leg. And coldness in both legs. They were in the ED January first, Julie Elliott felt like Julie Elliott couldn't move. Her legs were cold. Julie Elliott has lost a lot of weight quickly, over 100 pounds in the last 6 months. Julie Elliott eats the same, good appetite. Julie Elliott was told that her symptoms were related to arthritis and diabetic neuropathy. One month later they were in the ED again, decreased mobility and ataxia. Now Julie Elliott needs a cane to walk. Issues with balance and near falls. 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 Progressive, incapaciting pain 10/10 with difficulty performing daily tasks such as cleaning, toileting, bathing, cooking. Symptoms are mostly on the left side but her right leg is now affected. Assymmetric. Right leg slightly involved. Weakness as well as pain mostly on the left. Julie Elliott gets very hot. The left hand symptoms  were acute, the other symptoms have been insidious and have slowly progressed. Julie Elliott has to have someone bathe her now. Balance is poor. Julie Elliott has not fallen but very off balance. No injuries, no inciting factors. Julie Elliott had stoped taking her insulin in December because of her weight loss Julie Elliott no longer needs it. Son is resent with her and provides much of the information.   EMG/NCS showed severe CTS. Had emg/ncs at [facility] orth[facility].   Reviewed notes, labs and imaging from outside physicians, which showed:  DG Lumbar spine (reviewed images and agree with the following) : No evidence for fracture. Loss of disc height is seen at L3-4 and L5-S1 advanced facet osteoarthritis is seen bilaterally in the lower lumbar levels. No worrisome lytic or sclerotic osseous abnormality.  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  DG Cervical Spine: The cervicothoracic junction is partially obscured by osseous and soft tissue overlap on the lateral view. Cervical spine alignment is maintained. Vertebral body heights are preserved. There is disc space narrowing at C4-C5, C5-C6, and C6-C7 with associated endplate spurs. There is bony neural foraminal narrowing at C5-C6 bilaterally. The dens is intact. Posterior elements appear well-aligned. There is no evidence of fracture. No prevertebral soft tissue edema.  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 Bony neural foraminal narrowing at C5-C6. No acute bony abnormality.  DG Hip: There is no evidence of fracture. There is osteoarthritis of both hips with mild joint space  narrowing and marginal osteophytes. Sacroiliac joints appear normal. The patient has a tendency towards calcification at tendon insertions.  IMPRESSION: No acute finding. Osteoarthritis of both hips.  DG Left hand complete: Evaluation of the digits is limited due to flexed positioning. No fracture or dislocation. Mild osteoarthritis of the  thumb and index inger metacarpal phalangeal joints with joint space narrowing and osteophytes. The joint spaces are otherwise maintained. There are no erosions or periosteal reaction. No focal soft tissue abnormality.  DG Shoulder: Mild osteoarthritis. No evidence of inflammatory arthropathy or acute bony abnormality.  No fracture or dislocation. The alignment and joint spaces are maintained. There is mild osteoarthritis at the acromioclavicular joint. There is inferior spurring from the glenoid. No definite abnormal soft tissue calcifications.  IMPRESSION: Acromioclavicular and glenohumeral osteoarthritis. No acute bony abnormality.  Julie Elliott was seen in the ED in February for shoulder pain and limitations in movement. Julie Elliott was referred to Orthopaedics. Per notes, Julie Elliott also has hx of leg pain, knee pain, and hip pain which has been attributed to diabetic neuropathy and arthritis in the past. Has unbalance. CXR hip showed OA of both hips and mild joint space narrowing and osteophytes. SI joint was normal. Lumbar spine, degeneration disc And facet disease onf L3-L4, L5-S1. Dxed with possible frozen shoulder 2/2 to osteoarthritis complicated by rotator cuff tear vs bursitis   Julie Elliott was seen in the ED in January for bilateral cold lower extremities and worsening of her paresthesias, acute onset of cold feet and burning sensation. Exam noted Patient's legs are cool to the touch. Bilateral ABI was within normal limits. Dxed with likely peripheral neuropathy from her diabetes and possible decreased arterial blood flow to the legs.  HgbA1c 6.8 06/2014  Social History   Social History  . Marital Status: Single    Spouse Name: N/A  . Number of Children: 3  . Years of Education: 12   Occupational History  . Unemployed    Social History Main Topics  . Smoking status: Former Smoker -- 0.30 packs/day    Types: Cigarettes    Quit date: 04/18/2013  . Smokeless tobacco: Not on file     Comment: Smokes  5-6 cigerettes per day, 12/21/14 smokes rarely under stressful situations  . Alcohol Use: No  . Drug Use: No  . Sexual Activity: Not on file   Other Topics Concern  . Not on file   Social History Narrative   to get supplies nfrom [facility] per pt request.1--[phone]   Lives at home with son.    Right handed.   Caffeine use: drinks coffee-rare    Family History  Problem Relation Age of Onset  . Diabetes Mother   . Heart disease Mother   . Prostate cancer Father   . Neuropathy Neg Hx     Past Medical History  Diagnosis Date  . Hypertension     well controlled  . DJD (degenerative joint disease)   . HLD (hyperlipidemia) 2008    stable, well controlled  . DM (diabetes mellitus) 2008    stable HgBA1C at 6.5  . GERD (gastroesophageal reflux disease)     well controlled on Omeprazole  . Chronic headaches   . CHF (congestive heart failure)     Past Surgical History  Procedure Laterality Date  . Tubal ligation    . Carpel tunnel Right     20 yrs ago    Current Outpatient Prescriptions  Medication Sig Dispense Refill  . aspirin 81 MG EC tablet Take  1 tablet (81 mg total) by mouth daily. 90 tablet 1  . baclofen (LIORESAL) 10 MG tablet Take 1 tablet (10 mg total) by mouth 3 (three) times daily. 90 each 11  . Calcium-Vitamin D 600-125 MG-UNIT TABS Take 1 tablet by mouth daily. 90 each 1  . naproxen (NAPROSYN) 500 MG tablet TAKE 1 TABLET TWICE DAILY. 180 tablet 0  . Oxycodone HCl 10 MG TABS Take 1 tablet (10 mg total) by mouth every 6 (six) hours as needed. 45 tablet 0  . phenol (CHLORASEPTIC) 1.4 % LIQD 1 spray.    . potassium chloride SA (K-DUR,KLOR-CON) 20 MEQ tablet TAKE 1 TABLET ONCE DAILY. 90 tablet 1  . pregabalin (LYRICA) 75 MG capsule Take 1 capsule (75 mg total) by mouth 3 (three) times daily. 60 capsule 3  . senna-docusate (SENOKOT-S) 8.6-50 MG tablet Take 2 tablets by mouth.    . simvastatin (ZOCOR) 40 MG tablet TAKE 1 TABLET ONCE DAILY. 90 tablet 4   No  current facility-administered medications for this visit.    Allergies as of 06/14/2015 - Review Complete 03/27/2015  Allergen Reaction Noted  . Loratadine-pseudoephedrine er Hives     Vitals: BP 127/82 mmHg  Pulse 98  Temp(Src) 97.5 F (36.4 C) (Oral) Last Weight:  Wt Readings from Last 1 Encounters:  04/11/15 170 lb 6.4 oz (77.293 kg)   Last Height:   Ht Readings from Last 1 Encounters:  04/11/15 '5\' 3"'  (1.6 m)    Speech:  Speech is normal; fluent and spontaneous with normal comprehension.  Cognition:  The patient is oriented to person, place, and time;   recent and remote memory intact;   language fluent;   normal attention, concentration,   fund of knowledge Cranial Nerves:  The pupils are equal, round, and reactive to light. The fundi are flat. Visual fields are full to finger confrontation. Extraocular movements are intact. Trigeminal sensation is intact and the muscles of mastication are normal. The face is symmetric. The palate elevates in the midline. Hearing intact. Voice is normal. Shoulder shrug is normal. The tongue has normal motion without fasciculations.   Coordination:  No dysmetria with right arm, difficult to perform with other extremities due to weakness  Gait:  spastic gait, slow, walking aids needed to ambulate  Motor Observation: assymetry of the left lower extremitiy without edema, left ankle larger circumference, some muscle wasting.  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   Posture:  Posture is mildly stooped   Strength:  bilateral lower extremity spastic hemiparesis, left arm spastic paresis   Sensation: right leg decreased pp to mid calf Left leg decreased pp to the  ankle Vibration 3 seconds on the left and 5 second on the right at thegreat toes Proprioception intact bilaterally Decreased temp to below knees bilaterally    Reflex Exam:  DTR's: Brick biceps, patellars and achilles for age and medical conditions   Toes:  Left toe upgoing  Clonus:  Clonus is absent.    Assessment/Plan: Julie Elliott is a wonderful 58 y.o. female here as a referral from Dr. Arcelia Jew for progressive gait ataxia and spasticity, left hand pain and paresis, lower extremity bilateral spastic paresis.  Progressive spasticity, weakness, falls, painful paresthesias unintentional significant weight loss of over 100 pounds ,decreased mobility.  Exam with decreased sensation distally, weakness necessitating wheelchair, increased tone in the lower extremities with left upgoing toe, very brisk reflexes for age and medical conditions. Concerning for upper motor neuron lesion. 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   Repeat MRI of the cervical spine consistent with compressive myelopathy of the spinal cord due to spondylosis. Julie Elliott is s/p cervical decompression and is significantly improved, today on exam.   - Will refer to Dr. Letta Pate and Dr. Naaman Plummer for physical rehabilitation as well as evaluation for Botox for spasticity. - Needs primary care and podiatry. Will make referrals.     Son: [phone]  Sarina Ill, MD  [facility] [address] [place], [place] [address]  Phone [phone] Fax [phone]  A total of 60 minutes was spent face-to-face with this patient and her son. Over half this time was spent on counseling patient on the cervical cord lesion diagnosis and different diagnostic and therapeutic options available.

## 2015-06-15 DIAGNOSIS — E119 Type 2 diabetes mellitus without complications: Secondary | ICD-10-CM | POA: Diagnosis not present

## 2015-06-15 DIAGNOSIS — M24542 Contracture, left hand: Secondary | ICD-10-CM | POA: Diagnosis not present

## 2015-06-15 DIAGNOSIS — M150 Primary generalized (osteo)arthritis: Secondary | ICD-10-CM | POA: Diagnosis not present

## 2015-06-15 DIAGNOSIS — I110 Hypertensive heart disease with heart failure: Secondary | ICD-10-CM | POA: Diagnosis not present

## 2015-06-15 DIAGNOSIS — G1229 Other motor neuron disease: Secondary | ICD-10-CM | POA: Diagnosis not present

## 2015-06-15 DIAGNOSIS — M79642 Pain in left hand: Secondary | ICD-10-CM | POA: Diagnosis not present

## 2015-06-15 DIAGNOSIS — Z981 Arthrodesis status: Secondary | ICD-10-CM | POA: Diagnosis not present

## 2015-06-15 DIAGNOSIS — M4712 Other spondylosis with myelopathy, cervical region: Secondary | ICD-10-CM | POA: Diagnosis not present

## 2015-06-15 DIAGNOSIS — M4322 Fusion of spine, cervical region: Secondary | ICD-10-CM | POA: Diagnosis not present

## 2015-06-15 DIAGNOSIS — Z87891 Personal history of nicotine dependence: Secondary | ICD-10-CM | POA: Diagnosis not present

## 2015-06-15 DIAGNOSIS — I509 Heart failure, unspecified: Secondary | ICD-10-CM | POA: Diagnosis not present

## 2015-06-15 DIAGNOSIS — R270 Ataxia, unspecified: Secondary | ICD-10-CM | POA: Diagnosis not present

## 2015-06-15 DIAGNOSIS — E785 Hyperlipidemia, unspecified: Secondary | ICD-10-CM | POA: Diagnosis not present

## 2015-06-18 DIAGNOSIS — R270 Ataxia, unspecified: Secondary | ICD-10-CM | POA: Diagnosis not present

## 2015-06-18 DIAGNOSIS — M79642 Pain in left hand: Secondary | ICD-10-CM | POA: Diagnosis not present

## 2015-06-18 DIAGNOSIS — M4712 Other spondylosis with myelopathy, cervical region: Secondary | ICD-10-CM | POA: Diagnosis not present

## 2015-06-18 DIAGNOSIS — M150 Primary generalized (osteo)arthritis: Secondary | ICD-10-CM | POA: Diagnosis not present

## 2015-06-18 DIAGNOSIS — E119 Type 2 diabetes mellitus without complications: Secondary | ICD-10-CM | POA: Diagnosis not present

## 2015-06-18 DIAGNOSIS — M24542 Contracture, left hand: Secondary | ICD-10-CM | POA: Diagnosis not present

## 2015-06-18 NOTE — Telephone Encounter (Signed)
Called [facility] again. Advised I have not received PT orders yet. She states another department would be sending over orders. I should get it this week. Advised that Dr Jaynee Eagles out this week and I will be out of office starting Wednesday. She is going to send email to check on status.

## 2015-06-19 ENCOUNTER — Telehealth: Payer: Self-pay | Admitting: Neurology

## 2015-06-19 DIAGNOSIS — M79642 Pain in left hand: Secondary | ICD-10-CM | POA: Diagnosis not present

## 2015-06-19 DIAGNOSIS — M4712 Other spondylosis with myelopathy, cervical region: Secondary | ICD-10-CM | POA: Diagnosis not present

## 2015-06-19 DIAGNOSIS — S14105A Unspecified injury at C5 level of cervical spinal cord, initial encounter: Secondary | ICD-10-CM | POA: Insufficient documentation

## 2015-06-19 DIAGNOSIS — M24542 Contracture, left hand: Secondary | ICD-10-CM | POA: Diagnosis not present

## 2015-06-19 DIAGNOSIS — E119 Type 2 diabetes mellitus without complications: Secondary | ICD-10-CM | POA: Diagnosis not present

## 2015-06-19 DIAGNOSIS — M150 Primary generalized (osteo)arthritis: Secondary | ICD-10-CM | POA: Diagnosis not present

## 2015-06-19 DIAGNOSIS — R270 Ataxia, unspecified: Secondary | ICD-10-CM | POA: Diagnosis not present

## 2015-06-19 NOTE — Telephone Encounter (Signed)
Julie Elliott to patient today. can you make sure the following is in the works before you leave today please for Julie Elliott  1. Her referral to Drs. Read Drivers and Naaman Plummer is in progress - most important. 2. She gets referred to podiatry 3. Can you call Dr. Collier Salina Blomgren's office and see if he is taking new referals for primary care? And if his practice requires a membership fee, I heard that patient must pay a yearly fee now.   Thank you.

## 2015-06-19 NOTE — Telephone Encounter (Signed)
Spoke w/ Rance Muir who does referrals. She is going to work on getting referrals sent tomorrow and make sure pt is set up.

## 2015-06-19 NOTE — Telephone Encounter (Signed)
Called Dr [employer] office. Pt have to pay a yearly fee of 1650 dollars to be seen at his practice.

## 2015-06-21 DIAGNOSIS — M150 Primary generalized (osteo)arthritis: Secondary | ICD-10-CM | POA: Diagnosis not present

## 2015-06-21 DIAGNOSIS — R270 Ataxia, unspecified: Secondary | ICD-10-CM | POA: Diagnosis not present

## 2015-06-21 DIAGNOSIS — M4712 Other spondylosis with myelopathy, cervical region: Secondary | ICD-10-CM | POA: Diagnosis not present

## 2015-06-21 DIAGNOSIS — M24542 Contracture, left hand: Secondary | ICD-10-CM | POA: Diagnosis not present

## 2015-06-21 DIAGNOSIS — M79642 Pain in left hand: Secondary | ICD-10-CM | POA: Diagnosis not present

## 2015-06-21 DIAGNOSIS — E119 Type 2 diabetes mellitus without complications: Secondary | ICD-10-CM | POA: Diagnosis not present

## 2015-06-22 DIAGNOSIS — M150 Primary generalized (osteo)arthritis: Secondary | ICD-10-CM | POA: Diagnosis not present

## 2015-06-22 DIAGNOSIS — M24542 Contracture, left hand: Secondary | ICD-10-CM | POA: Diagnosis not present

## 2015-06-22 DIAGNOSIS — R270 Ataxia, unspecified: Secondary | ICD-10-CM | POA: Diagnosis not present

## 2015-06-22 DIAGNOSIS — M4712 Other spondylosis with myelopathy, cervical region: Secondary | ICD-10-CM | POA: Diagnosis not present

## 2015-06-22 DIAGNOSIS — E119 Type 2 diabetes mellitus without complications: Secondary | ICD-10-CM | POA: Diagnosis not present

## 2015-06-22 DIAGNOSIS — M79642 Pain in left hand: Secondary | ICD-10-CM | POA: Diagnosis not present

## 2015-06-25 DIAGNOSIS — M24542 Contracture, left hand: Secondary | ICD-10-CM | POA: Diagnosis not present

## 2015-06-25 DIAGNOSIS — R270 Ataxia, unspecified: Secondary | ICD-10-CM | POA: Diagnosis not present

## 2015-06-25 DIAGNOSIS — M150 Primary generalized (osteo)arthritis: Secondary | ICD-10-CM | POA: Diagnosis not present

## 2015-06-25 DIAGNOSIS — E119 Type 2 diabetes mellitus without complications: Secondary | ICD-10-CM | POA: Diagnosis not present

## 2015-06-25 DIAGNOSIS — M79642 Pain in left hand: Secondary | ICD-10-CM | POA: Diagnosis not present

## 2015-06-25 DIAGNOSIS — M4712 Other spondylosis with myelopathy, cervical region: Secondary | ICD-10-CM | POA: Diagnosis not present

## 2015-06-25 NOTE — Telephone Encounter (Signed)
Called Julie Elliott again to check on PT orders. She is going to fax over first thing tomorrow morning. Advised I have not received anything yet.

## 2015-06-26 DIAGNOSIS — M79642 Pain in left hand: Secondary | ICD-10-CM | POA: Diagnosis not present

## 2015-06-26 DIAGNOSIS — M24542 Contracture, left hand: Secondary | ICD-10-CM | POA: Diagnosis not present

## 2015-06-26 DIAGNOSIS — M150 Primary generalized (osteo)arthritis: Secondary | ICD-10-CM | POA: Diagnosis not present

## 2015-06-26 DIAGNOSIS — M4712 Other spondylosis with myelopathy, cervical region: Secondary | ICD-10-CM | POA: Diagnosis not present

## 2015-06-26 DIAGNOSIS — E119 Type 2 diabetes mellitus without complications: Secondary | ICD-10-CM | POA: Diagnosis not present

## 2015-06-26 DIAGNOSIS — R270 Ataxia, unspecified: Secondary | ICD-10-CM | POA: Diagnosis not present

## 2015-06-29 DIAGNOSIS — M79642 Pain in left hand: Secondary | ICD-10-CM | POA: Diagnosis not present

## 2015-06-29 DIAGNOSIS — M4712 Other spondylosis with myelopathy, cervical region: Secondary | ICD-10-CM | POA: Diagnosis not present

## 2015-06-29 DIAGNOSIS — E119 Type 2 diabetes mellitus without complications: Secondary | ICD-10-CM | POA: Diagnosis not present

## 2015-06-29 DIAGNOSIS — M24542 Contracture, left hand: Secondary | ICD-10-CM | POA: Diagnosis not present

## 2015-06-29 DIAGNOSIS — R270 Ataxia, unspecified: Secondary | ICD-10-CM | POA: Diagnosis not present

## 2015-06-29 DIAGNOSIS — M150 Primary generalized (osteo)arthritis: Secondary | ICD-10-CM | POA: Diagnosis not present

## 2015-07-02 DIAGNOSIS — Z981 Arthrodesis status: Secondary | ICD-10-CM | POA: Diagnosis not present

## 2015-07-02 DIAGNOSIS — Z4789 Encounter for other orthopedic aftercare: Secondary | ICD-10-CM | POA: Diagnosis not present

## 2015-07-02 NOTE — Telephone Encounter (Signed)
LVM for pt regarding Dr Jaynee Eagles message below. Gave [facility] phone number.

## 2015-07-02 NOTE — Telephone Encounter (Signed)
Dr Jaynee Eagles- pt referral sent to Dr Naaman Plummer. Pt also scheduled with Myeong Sheard on 12/13 for podiatry.

## 2015-07-02 NOTE — Telephone Encounter (Signed)
Julie Elliott - would you call patient and let her know that Dr. Sandi Mariscal is probably not a possibility since he charges a fee to join his practice as a provider. We will have to find a different primary care for her. I would like her to see Dr. Read Drivers or Naaman Plummer and get their opinion on a primary care physician, can you ensure the referral has been sent to them? We will ask around as well and try and find some good references for her. Also, did we find a podiatrist for her? I placed the referral, let me know thank you

## 2015-07-03 NOTE — Telephone Encounter (Signed)
LVM for Julie Elliott to call back. Advised I have not seen any PT orders yet. Gave [facility] phone number.

## 2015-07-04 DIAGNOSIS — M150 Primary generalized (osteo)arthritis: Secondary | ICD-10-CM | POA: Diagnosis not present

## 2015-07-04 DIAGNOSIS — M4712 Other spondylosis with myelopathy, cervical region: Secondary | ICD-10-CM | POA: Diagnosis not present

## 2015-07-04 DIAGNOSIS — E119 Type 2 diabetes mellitus without complications: Secondary | ICD-10-CM | POA: Diagnosis not present

## 2015-07-04 DIAGNOSIS — M24542 Contracture, left hand: Secondary | ICD-10-CM | POA: Diagnosis not present

## 2015-07-04 DIAGNOSIS — M79642 Pain in left hand: Secondary | ICD-10-CM | POA: Diagnosis not present

## 2015-07-04 DIAGNOSIS — R270 Ataxia, unspecified: Secondary | ICD-10-CM | POA: Diagnosis not present

## 2015-07-04 NOTE — Telephone Encounter (Signed)
Julie Elliott called sts she is very sorry she did not get information to [facility]. She forgot and will get orders faxed tonight

## 2015-07-05 NOTE — Telephone Encounter (Signed)
Called Julie Elliott back. Advised I got her message but still have not received PT orders. Advised I was not in office yesterday. Asked her to call back so we could get this worked out today.

## 2015-07-06 NOTE — Telephone Encounter (Signed)
Received PT orders and faxed back signed orders from Dr Jaynee Eagles.  Also faxed other orders from [facility] for OT.

## 2015-07-11 DIAGNOSIS — M150 Primary generalized (osteo)arthritis: Secondary | ICD-10-CM | POA: Diagnosis not present

## 2015-07-11 DIAGNOSIS — M24542 Contracture, left hand: Secondary | ICD-10-CM | POA: Diagnosis not present

## 2015-07-11 DIAGNOSIS — R270 Ataxia, unspecified: Secondary | ICD-10-CM | POA: Diagnosis not present

## 2015-07-11 DIAGNOSIS — E119 Type 2 diabetes mellitus without complications: Secondary | ICD-10-CM | POA: Diagnosis not present

## 2015-07-11 DIAGNOSIS — M79642 Pain in left hand: Secondary | ICD-10-CM | POA: Diagnosis not present

## 2015-07-11 DIAGNOSIS — M4712 Other spondylosis with myelopathy, cervical region: Secondary | ICD-10-CM | POA: Diagnosis not present

## 2015-07-17 ENCOUNTER — Encounter: Payer: Self-pay | Admitting: Podiatry

## 2015-07-17 ENCOUNTER — Ambulatory Visit (INDEPENDENT_AMBULATORY_CARE_PROVIDER_SITE_OTHER): Payer: Medicare Other | Admitting: Podiatry

## 2015-07-17 VITALS — Ht 63.0 in | Wt 170.0 lb

## 2015-07-17 DIAGNOSIS — G609 Hereditary and idiopathic neuropathy, unspecified: Secondary | ICD-10-CM

## 2015-07-17 DIAGNOSIS — B351 Tinea unguium: Secondary | ICD-10-CM | POA: Diagnosis not present

## 2015-07-17 DIAGNOSIS — M79606 Pain in leg, unspecified: Secondary | ICD-10-CM | POA: Diagnosis not present

## 2015-07-17 DIAGNOSIS — G629 Polyneuropathy, unspecified: Secondary | ICD-10-CM | POA: Insufficient documentation

## 2015-07-17 NOTE — Progress Notes (Signed)
SUBJECTIVE: 59 y.o. year old female presents using a cane with problem on toe nails. Used to be diabetic till she lost much weight.  Spasms in hands and legs get hot.  Review of systems reveal positive for history of bulging disc in neck and causing problem in shoulder and hand on left side allowing limited mobility. Had disc surgery in October 2016. CHF in 1994, controlled with Digoxin and Vasotec.   OBJECTIVE: DERMATOLOGIC EXAMINATION: Nails:Hypertrophic and dystrophic nails x 10.  VASCULAR EXAMINATION OF LOWER LIMBS: Pedal pulses: All pedal pulses are faintly palpable on both feet.  Capillary Filling times within 3 seconds in all digits.  Cold feet to tough. NEUROLOGIC EXAMINATION OF THE LOWER LIMBS: Both lower limbs feel hot subjectively. They care cold to touch. Achilles DTR is present and within normal on right. Monofilament (Semmes-Weinstein 10-gm) sensory testing positive 6 out of 6, bilateral. Vibratory sensations(128Hz  turning fork) intact at medial and lateral forefoot bilateral.  Sharp and Dull discriminatory sensations at the plantar ball of hallux is intact bilateral.  MUSCULOSKELETAL EXAMINATION: Positive for stiffness on left foot forefoot and rearfoot.  Normal on right foot.   ASSESSMENT: Onychomycosis x 10. Peripheral Neuropathy with history of spinal cord pathology and surgery.  PLAN: Debrided all nails. Advised to be active in exercise. Mat benefit from Neuremedy for weakened nerve.

## 2015-07-17 NOTE — Patient Instructions (Signed)
Seen for hypertrophic nails. Having sensory difficulty both lower limbs. All nails debrided. May benefit from nerve vitamins.  Return in 3 months or as needed.

## 2015-07-18 DIAGNOSIS — M24542 Contracture, left hand: Secondary | ICD-10-CM | POA: Diagnosis not present

## 2015-07-18 DIAGNOSIS — E119 Type 2 diabetes mellitus without complications: Secondary | ICD-10-CM | POA: Diagnosis not present

## 2015-07-18 DIAGNOSIS — M79642 Pain in left hand: Secondary | ICD-10-CM | POA: Diagnosis not present

## 2015-07-18 DIAGNOSIS — M150 Primary generalized (osteo)arthritis: Secondary | ICD-10-CM | POA: Diagnosis not present

## 2015-07-18 DIAGNOSIS — R270 Ataxia, unspecified: Secondary | ICD-10-CM | POA: Diagnosis not present

## 2015-07-18 DIAGNOSIS — M4712 Other spondylosis with myelopathy, cervical region: Secondary | ICD-10-CM | POA: Diagnosis not present

## 2015-08-08 ENCOUNTER — Encounter: Payer: Self-pay | Admitting: *Deleted

## 2015-08-14 ENCOUNTER — Ambulatory Visit (INDEPENDENT_AMBULATORY_CARE_PROVIDER_SITE_OTHER): Payer: Medicare Other | Admitting: Internal Medicine

## 2015-08-14 ENCOUNTER — Encounter: Payer: Self-pay | Admitting: Internal Medicine

## 2015-08-14 DIAGNOSIS — F17210 Nicotine dependence, cigarettes, uncomplicated: Secondary | ICD-10-CM | POA: Diagnosis not present

## 2015-08-14 DIAGNOSIS — G609 Hereditary and idiopathic neuropathy, unspecified: Secondary | ICD-10-CM

## 2015-08-14 DIAGNOSIS — G9520 Unspecified cord compression: Secondary | ICD-10-CM

## 2015-08-14 DIAGNOSIS — G629 Polyneuropathy, unspecified: Secondary | ICD-10-CM

## 2015-08-14 DIAGNOSIS — Z720 Tobacco use: Secondary | ICD-10-CM

## 2015-08-14 DIAGNOSIS — Z8639 Personal history of other endocrine, nutritional and metabolic disease: Secondary | ICD-10-CM

## 2015-08-14 DIAGNOSIS — I10 Essential (primary) hypertension: Secondary | ICD-10-CM | POA: Diagnosis not present

## 2015-08-14 DIAGNOSIS — E785 Hyperlipidemia, unspecified: Secondary | ICD-10-CM

## 2015-08-14 DIAGNOSIS — R634 Abnormal weight loss: Secondary | ICD-10-CM

## 2015-08-14 DIAGNOSIS — E1159 Type 2 diabetes mellitus with other circulatory complications: Secondary | ICD-10-CM

## 2015-08-14 DIAGNOSIS — E1169 Type 2 diabetes mellitus with other specified complication: Secondary | ICD-10-CM

## 2015-08-14 DIAGNOSIS — I152 Hypertension secondary to endocrine disorders: Secondary | ICD-10-CM

## 2015-08-14 LAB — GLUCOSE, CAPILLARY: Glucose-Capillary: 103 mg/dL — ABNORMAL HIGH (ref 65–99)

## 2015-08-14 LAB — POCT GLYCOSYLATED HEMOGLOBIN (HGB A1C): Hemoglobin A1C: 5.4

## 2015-08-14 MED ORDER — GABAPENTIN 300 MG PO CAPS: 300.0000 mg | ORAL_CAPSULE | Freq: Three times a day (TID) | ORAL | Status: DC

## 2015-08-14 MED ORDER — METHOCARBAMOL 500 MG PO TABS: 500.0000 mg | ORAL_TABLET | Freq: Four times a day (QID) | ORAL | Status: DC | PRN

## 2015-08-14 NOTE — Patient Instructions (Signed)
-   Stop taking potassium - Blood work today - STOP SMOKING!!! - Try Gabapentin 300 mg three times daily for your nerve pain - Try Robaxin 500 mg every 6 hours ONLY AS NEEDED for muscle spasms - We will check your electrolytes today to see if that is the cause for your spasms  General Instructions:   Please bring your medicines with you each time you come to clinic.  Medicines may include prescription medications, over-the-counter medications, herbal remedies, eye drops, vitamins, or other pills.   Progress Toward Treatment Goals:  Treatment Goal 02/27/2015  Hemoglobin A1C at goal  Blood pressure at goal  Stop smoking -    Self Care Goals & Plans:  Self Care Goal 08/14/2015  Manage my medications take my medicines as prescribed; bring my medications to every visit; refill my medications on time  Monitor my health -  Eat healthy foods eat more vegetables; eat foods that are low in salt; eat baked foods instead of fried foods  Be physically active find an activity I enjoy    Home Blood Glucose Monitoring 06/20/2014  Check my blood sugar once a day  When to check my blood sugar -     Care Management & Community Referrals:  Referral 10/17/2014  Referrals made for care management support nutritionist

## 2015-08-15 ENCOUNTER — Telehealth: Payer: Self-pay | Admitting: *Deleted

## 2015-08-15 LAB — CBC WITH DIFFERENTIAL/PLATELET
BASOS ABS: 0.1 10*3/uL (ref 0.0–0.2)
Basos: 1 %
EOS (ABSOLUTE): 0.1 10*3/uL (ref 0.0–0.4)
Eos: 2 %
Hematocrit: 40 % (ref 34.0–46.6)
Hemoglobin: 13.7 g/dL (ref 11.1–15.9)
IMMATURE GRANULOCYTES: 0 %
Immature Grans (Abs): 0 10*3/uL (ref 0.0–0.1)
LYMPHS: 55 %
Lymphocytes Absolute: 3.1 10*3/uL (ref 0.7–3.1)
MCH: 29.7 pg (ref 26.6–33.0)
MCHC: 34.3 g/dL (ref 31.5–35.7)
MCV: 87 fL (ref 79–97)
MONOS ABS: 0.4 10*3/uL (ref 0.1–0.9)
Monocytes: 7 %
NEUTROS PCT: 35 %
Neutrophils Absolute: 2 10*3/uL (ref 1.4–7.0)
PLATELETS: 209 10*3/uL (ref 150–379)
RBC: 4.61 x10E6/uL (ref 3.77–5.28)
RDW: 14.4 % (ref 12.3–15.4)
WBC: 5.7 10*3/uL (ref 3.4–10.8)

## 2015-08-15 LAB — CMP14 + ANION GAP
ALK PHOS: 70 IU/L (ref 39–117)
ALT: 9 IU/L (ref 0–32)
AST: 12 IU/L (ref 0–40)
Albumin/Globulin Ratio: 1.7 (ref 1.1–2.5)
Albumin: 4 g/dL (ref 3.5–5.5)
Anion Gap: 15 mmol/L (ref 10.0–18.0)
BUN/Creatinine Ratio: 16 (ref 9–23)
BUN: 9 mg/dL (ref 6–24)
Bilirubin Total: 0.5 mg/dL (ref 0.0–1.2)
CALCIUM: 9.2 mg/dL (ref 8.7–10.2)
CO2: 21 mmol/L (ref 18–29)
CREATININE: 0.55 mg/dL — AB (ref 0.57–1.00)
Chloride: 105 mmol/L (ref 96–106)
GFR calc Af Amer: 120 mL/min/{1.73_m2} (ref >59)
GFR, EST NON AFRICAN AMERICAN: 104 mL/min/{1.73_m2} (ref >59)
GLOBULIN, TOTAL: 2.4 g/dL (ref 1.5–4.5)
Glucose: 113 mg/dL — ABNORMAL HIGH (ref 65–99)
Potassium: 3.8 mmol/L (ref 3.5–5.2)
SODIUM: 141 mmol/L (ref 134–144)
Total Protein: 6.4 g/dL (ref 6.0–8.5)

## 2015-08-15 LAB — TSH: TSH: 0.836 u[IU]/mL (ref 0.450–4.500)

## 2015-08-15 NOTE — Telephone Encounter (Signed)
Form on Emma desk. 

## 2015-08-15 NOTE — Progress Notes (Signed)
   Subjective:    Patient ID: Julie Elliott, female    DOB: 12-Jun-1956, 60 y.o.   MRN: [id number]  HPI Julie Elliott is a 60yo woman with PMHx of HTN, type 2 DM, peripheral neuropathy, and recent compressive myelopathy s/p ACDF who presents today for follow up of her hypertension.   HTN: BP controlled at 131/82. She has not required any BP medications since losing over 100 lbs within the last year.   Hx of Type 2 DM: Last A1c 5.3 in July 2016, today is 5.4. Her Metformin was stopped at last visit. Due to her extensive weight loss her diabetes has resolved. She denies any blurry vision, polyuria, or polydipsia.   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 She states she has been doing well since the surgery. She completed 3 months of PT/OT. She does note having muscle spasms that are not well controlled with baclofen.   Peripheral Neuropathy: She reports intense burning pains in her bilateral lower extremities. She describes her legs feeling as if "they are on fire." These symptoms have been ongoing for the past year. She has been taking Lyrica and naproxen with no relief.   Hyperlipidemia: Last LDL 54. She is taking simvastatin 40 mg daily.   Tobacco Abuse: She reports she restarted smoking recently due to the pains in her legs. She had previously quit in 2014. She states smoking helps with the stress and pain. She is smoking 1 pack over the course of 3 days. She reports she knows she shouldn't be smoking again and would like to quit again.   Unintentional Weight Loss: She continues to lose weight despite having a good appetite. Her son who accompanied to her reports she does have a good appetite and eats 2-3 good-sized meals daily. Her weight at last visit (July 2016) was 175 lbs and today is 164 lbs. She has lost a 100 lbs over the last 18 months. She denies any fevers, chills, dysphagia, odynophagia,  abdominal pain, diarrhea, constipation, melena, hematochezia, and vaginal bleeding. All of her routine screening is up to date and unremarkable, including mammogram, pap smear, and colonoscopy.    Review of Systems General: Denies night sweats, changes in appetite HEENT: Denies headaches, ear pain, rhinorrhea, sore throat CV: Denies CP, palpitations, SOB, orthopnea Pulm: Denies SOB, cough, wheezing GI: See HPI GU: Denies dysuria, hematuria, frequency Msk: See HPI Neuro: Denies weakness Skin: Denies rashes, bruising Psych: Denies depression, anxiety, hallucinations    Objective:   Physical Exam General: alert, sitting up, appears thinner compared to last visit HEENT: [place]/AT, EOMI, sclera anicteric, mucus membranes moist Neck: supple, no lymphadenopathy, no thyromegaly CV: RRR, no m/g/r Pulm: CTA bilaterally, breaths non-labored Abd: BS+, soft, non-tender, non-distended Ext: warm, no peripheral edema. She complains of an intense burning pain when her lower extremities are palpated. Left hand with contracture. Decreased ROM in left shoulder. Neuro: alert and oriented x 3, walks with cane      Assessment & Plan:  Please refer to A&P documentation.

## 2015-08-16 DIAGNOSIS — Z720 Tobacco use: Secondary | ICD-10-CM | POA: Insufficient documentation

## 2015-08-16 NOTE — Progress Notes (Signed)
Internal Medicine Clinic Attending  Case discussed with Dr. Rivet soon after the resident saw the patient.  We reviewed the resident's history and exam and pertinent patient test results.  I agree with the assessment, diagnosis, and plan of care documented in the resident's note.  

## 2015-08-16 NOTE — Assessment & Plan Note (Signed)
BP Readings from Last 3 Encounters:  08/14/15 131/82  06/14/15 127/82  04/11/15 115/78    Lab Results  Component Value Date   NA 141 08/14/2015   K 3.8 08/14/2015   CREATININE 0.55* 08/14/2015    Assessment: Blood pressure control:  BP well controlled without any medications. Her hypertension has essentially resolved with her extreme weight loss.    Plan: Medications:  None  Other plans:  - Continue to monitor BP - Work up for unintentional weight loss ongoing

## 2015-08-16 NOTE — Assessment & Plan Note (Signed)
Lab Results  Component Value Date   HGBA1C 5.4 08/14/2015   HGBA1C 5.3 02/08/2015   HGBA1C 5.9 09/12/2014     Assessment: Diabetes control:  Well controlled Comments: Her diabetes has essentially resolved with her unintentional weight loss. Will discuss the risks/benefits of continued monitoring for her diabetes. At this point she is off all medications and A1c in normal range. Unclear if there is any benefit for monitoring her A1c every 3 months, getting eye exams, etc.   Plan: Medications:  None  Instruction/counseling given: reminded to bring medications to each visit and discussed diet Other plans:  - Will have her come back in 6 months to recheck A1c. Not sure if there is any benefit for seeing her every 3 months since her diabetes has essentially resolved with her weight loss. - Work up ongoing for her unintentional weight loss

## 2015-08-16 NOTE — Assessment & Plan Note (Signed)
The cause for her weight loss is still unknown. Work up, including mammogram, pap smear, colonoscopy, labs (CBC., CMET, TSH, SPEP/UPEP, HIV), CXR are all normal. Patient has no other symptoms pointing towards a specific etiology or organ system. She has a good appetite and is not experiencing diarrhea to suggest a malnutrition or absorption problem. Her nutrition status is great, albumin 4.0. She did have a 2-3 mm right upper lobe subpleural lymph node (unchanged from 09/05/2014) noted on CT Abd/Pelvis in August 2016. Will consider getting a repeat CT scan in the next 1-2 months to see if this could be a potential source. Encouraged patient to continue her good eating habits. Will continue to monitor to her weight.

## 2015-08-16 NOTE — Assessment & Plan Note (Signed)
Patient doing much better after her surgery and subsequent PT/OT. She is walking with a cane. Baclofen does not seem to be helping her associated muscle spasms. She has not been taking it. Will stop Baclofen and try Robaxin, although do not expect it to help much. Recommended to continue PT/OT exercises at home and can consider further outpatient therapy if needed. - Prescribed Robaxin 500 mg Q6H PRN - Has f/u with Neurology on 09/11/14

## 2015-08-16 NOTE — Assessment & Plan Note (Addendum)
Continues to have intense burning pains in her legs. Her neuropathy could be secondary to her prior diabetes. No history of alcohol abuse. Vitamin B12 normal in May 2016. Lyrica is not helping her pain and she has stopped taking this medication.  - Will try Gabapentin 300 mg TID - Stop Lyrica

## 2015-08-16 NOTE — Assessment & Plan Note (Signed)
Continue simvastatin 40 mg daily. 

## 2015-08-16 NOTE — Assessment & Plan Note (Signed)
  Assessment: Progress toward smoking cessation:   Started smoking again  Barriers to progress toward smoking cessation:   Stress, pain Comments: Encouraged patient to quit immediately as she just restarted smoking recently. I advised her that there are other ways to cope with stress/pain/being home alone than smoking. We discussed things that she enjoys and focused on pursuing one of those instead of smoking.   Plan: Instruction/counseling given:  I counseled patient on the dangers of tobacco use, advised patient to stop smoking, and reviewed strategies to maximize success. Medications to assist with smoking cessation:  None Patient agreed to the following self-care plans for smoking cessation:   Cut down on number of cigarettes Other plans:  - Strongly encouraged her to quit now - Will continue to follow her cessation status

## 2015-09-12 ENCOUNTER — Ambulatory Visit (INDEPENDENT_AMBULATORY_CARE_PROVIDER_SITE_OTHER): Payer: Medicare Other | Admitting: Neurology

## 2015-09-12 ENCOUNTER — Encounter: Payer: Self-pay | Admitting: *Deleted

## 2015-09-12 ENCOUNTER — Encounter: Payer: Self-pay | Admitting: Neurology

## 2015-09-12 VITALS — BP 120/75 | HR 87 | Ht 63.0 in | Wt 171.0 lb

## 2015-09-12 DIAGNOSIS — M4712 Other spondylosis with myelopathy, cervical region: Secondary | ICD-10-CM

## 2015-09-12 DIAGNOSIS — G839 Paralytic syndrome, unspecified: Secondary | ICD-10-CM | POA: Insufficient documentation

## 2015-09-12 DIAGNOSIS — M629 Disorder of muscle, unspecified: Secondary | ICD-10-CM

## 2015-09-12 MED ORDER — PREGABALIN 75 MG PO CAPS: 75.0000 mg | ORAL_CAPSULE | Freq: Two times a day (BID) | ORAL | Status: DC

## 2015-09-12 NOTE — Progress Notes (Signed)
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 She is doing exceptionally well. The spasticity is improving and so it the pain. She still has balance problems but is up walking around with a cane, no falls. Have recommended baclofen for the spasticity but she is worried about taking it. I also recommend Rehab with Dr. Letta Pate and Naaman Plummer as well as botox for the left arm spasticity. The weight loss has stabilized. She needs podiatry for her toe nail problems. I am extremely pleased with her progress after surgery with Dr. Rigoberto Noel at [facility], a remarkable improvement. She has some spasms in the left arm and both legs. The spasms are less with the Lyrica. The Baclofen didn't help.Her right leg has gotten weaker. She lives with her son who cares for her. Sleeping well at night. She has lost weight. She has spasms mostly in the left arm and mostly in the hand. She needs PT, there was an error in the referral to [place] and Naaman Plummer which has been corrected. Discussed spasticity with patient. Have prescribed baclofen for patient but she is not interested in taking it, even though it can help with her spasticity. Discussed increasing baclofen and she declines, it makes her sleepy and difficult to walk. She is urinating fine, but bowel movements are difficult  Interval update 04/15/2015: Julie Elliott is a 60 y.o. female here as a referral from Dr. Arcelia Elliott for progressive gait ataxia, left hand pain and weakness. Julie Elliott is a 60yo woman with PMHx of HTN, systolic CHF, GERD, Type 2 DM, and hyperlipidemia who presents today for the following: Progressive spasticity, weakness, falls, painful  paresthesias unintentional significant weight loss of over 100 pounds, decreased mobility. MRI of the cervical cord is significant for T2 hyperintense lesion extending from C4-C7 with focal spinal cord enhancement at C5-C6. She has gone through extensive workup and returns today to review, with son. 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 She is gone through extensive testing (see below) including a nuclear medicine PET scan whose findings were largely unremarkable except for asymmetric metabolic activity in the RIGHT vallecula region of the hypopharynx para the RIGHT (will send for direct visualization to rule out head and neck malignancy with Dr. Ernesto Elliott), CT of the chest abdomen and pelvis were largely unremarkable and no malignancy was found. Extensive serum testing includes the following unremarkable labs: CMP, CBC, TSH, sedimentation rate, HIV with negative reflex, ANA with rheumatologic comprehensive panel, vitamin B1, heavy metals in the blood, pan-ANCA panel, multiple myeloma panel, vitamin B6, rheumatoid factor, paraneoplastic profile, high sensitivity CRP, hep C antibody, RPR,, B12 and folate, angiotensin-converting enzyme, hemoglobin A1c, CK, NMO, copper, ceruloplasmin, HTLV-1 and 2 antibodies, JCV,   CSF studies were completed which include normal myelin basic protein, positive CSF oligoclonal pans greater than 5 that are not present in the patient's corresponding serum sample,negative Lyme antibodies, normal CNS IgG index, negative Echo virus antibodies, a high index of varus sellar zoster IgG in the CSF was found at 2518 (upper limit normal of 135) however these antibodies were also found in an higher quantity the serum and so  this is not consistent with intrathecal production, negative Epstein-Barr PCR, negative VDRL, negative CMV PCR, negative HSV PCR, still pending VZV DNA PCR, fungus culture with smear in  progress, cell count with differential unremarkable, normal glucose and elevated protein.   Repeat MRI cervical cord: FINDINGS: :  On sagittal images, the spine is imaged from above the cervicomedullary junction to T3T4. There is increased signal within the spinal cord from C4-C5 to C7 on T2-weighted and STIR images. There is focal enlargement of the spinal cord adjacent to C5C6 and C6. There is relatively flat enhancement within this region only adjacent to C5C6.   The vertebral bodies are normally aligned. The vertebral bodies have normal signal. The discs and interspaces were further evaluated on axial views from C2 to T1 as follows: C2 - C3: The disc and interspace appear normal. C3 - C4: There is mild uncovertebral spurring and disc bulging causing moderate bilateral foraminal narrowing. There is no definite nerve root compression, but there is some encroachment of the exiting C4 nerve roots. C4 - C5: There is mild bilateral uncovertebral spurring and imaging causing severe bilateral foraminal narrowing. There could be compression of either of the C5 nerve roots. Posteriorly within the spinal cord, there is hyperintense signal on T2 images extending inferiorly.  C5 - C6: There is moderate spinal stenosis due to broad disc protrusion, uncovertebral spurring and facet hypertrophy. There is severe left and very severe right foraminal narrowing leading to right C6 and possible left C6 nerve root compression. The entire spinal cord is hyperintense C6 - C7: There is broad disc protrusion and facet hypertrophy causing severe left and moderate right foraminal narrowing. There is possible left C7 nerve root compression. Abnormal signal is seen in the entire spinal cord at the interspace and medially adjacent to the C7 vertebral body.  C7 - T1: The disc and interspace appear normal. Hyperintense signal is noted medially and anteriorly only, within the spinal cord T1 - T2:  The disc and interspace appear normal.   Compared to the MRI dated 03/13/2015, there is no definite change.   IMPRESSION: This is an abnormal MRI of the cervical spine with and without contrast showing: 1. Abnormal T2 hyperintense signal within the spinal cord from C4C5 to C7 with mild expansion of the cord adjacent to C5C6 to C6. There is "pancake-like" flat enhancement within the spinal cord only adjacent to C5C6. The findings are stable compared to 03/13/2015. 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 Inflammatory, demyelinating and neoplastic etiologies remain in the differential diagnosis 2. At [place], there is severe foraminal narrowing bilaterally that could lead to compression of either c5 nerve root. 3. At C5C6, there is moderate spinal stenosis and very severe right and severe left foraminal narrowing. There is probable right and possible left C6 nerve root compression.  4. At C6C7, there is severe left foraminal narrowing with possible left C7 nerve root compression  An extensive workup has been completed on patient without another etiology found except for possible compressive spondylosis of the spine. Workup consisted of  Nuclear medicine PET image: FINDINGS: NECK There is increased activity within the spinal cord from the skullbase through C7. There is focal activity at C5-C6 which corresponds to the lesion on comparison MRI. No hypermetabolic cervical lymphadenopathy.   There is asymmetric metabolic activity within the RIGHT vallecula compared to  the LEFT (image 24 of the fused data set). This activity is intense with SUV max equal 11.6 compared to prior wall 7.0 on the contralateral side. No discrete mass lesions identified.   Here is a focus of activity at the LEFT small first rib  and clavicle junction (image 48 of fused data set) without associated finding on the CT portion.  CHEST  No hypermetabolic mediastinal or hilar nodes. 4 mm on palm lung nodule along the RIGHT oblique fissure on image (image 25 with, series 6).  ABDOMEN/PELVIS  No abnormal hypermetabolic activity within the liver, pancreas, adrenal glands, or spleen. No hypermetabolic lymph nodes in the abdomen or pelvis. Nonobstructing calculus in the LEFT kidney measures 7 mm. Uterus and ovaries are normal.  SKELETON  No focal hypermetabolic activity to suggest skeletal metastasis.  IMPRESSION: 1. Hypermetabolic focus within the spinal cord corresponds to enhancing lesion on comparison cervical MRI. Differential includes inflammatory process, infectious process, or neoplastic process.). 2. Asymmetric metabolic activity in the RIGHT vallecula region of the hypopharynx para the RIGHT. Consider direct visualization if concern for head and neck malignancy. 3. Asymmetric uptake at the junction of the first rib LEFT clavicle is likely degenerative as there is no lesion identified on comparison CT 4. Small nodule along the RIGHT oblique fissure is likely benign. If the patient is at high risk for bronchogenic carcinoma, follow-up chest CT at 1 year is recommended. If the patient is at low risk, no follow-up is needed. This recommendation follows the consensus statement: Guidelines for Management of Small Pulmonary Nodules Detected on CT Scans: A Statement from the [place] as published in Radiology 2005; 237:395-400.  CT ABDOMEN PELVIS FINDINGS  Hepatobiliary: Cholecystectomy.  Pancreas: Unremarkable  Spleen: Unremarkable  Adrenals/Urinary Tract: 6 mm left mid kidney nonobstructive renal calculus.  Stomach/Bowel: Small periampullary duodenal diverticulum.  Vascular/Lymphatic: Mild aortoiliac atherosclerotic calcification.  Reproductive: Unremarkable  Other: No supplemental  non-categorized findings.  CT of the chest, abdomen and pelvis with contrast  Musculoskeletal: Considerable lower lumbar spondylosis and degenerative disc disease thought to be causing impingement at ultiple lumbar levels but most particularly at L5-S1 and L4-5. This appears significantly progressive compared to the exam from 09/14/2006. High density along the anterior epidural tissues at the L5 level probably from disc material or venous plexus. Spurring of both hips noted.  IMPRESSION: 1. 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 2. The 2-3 mm right upper lobe subpleural lymph node, unchanged from 09/05/2014. This documents 6 months of stability. If the patient is at high risk for bronchogenic carcinoma, follow-up chest CT in 6 months is recommended. If the patient is at low risk, no follow-up is needed. This recommendation follows the consensus statement: Guidelines for Management of Small Pulmonary Nodules Detected on CT Scans: A Statement from the [place] as published in Radiology 2005; 237:395-400. 3. 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 4. 6 mm nonobstructive left mid kidney renal calculus. 5. Mild atherosclerosis.  MR Brain:  IMPRESSION: Abnormal MRI scan of the brain showing tiny scattered periventricular and subcortical nonspecific white matter hyperintensities with differential discussed above. No enhancing lesions are noted.  MRI Thoracic spine; Unremarkable  Interval update 03/15/2015: Julie Elliott is a 60 y.o. female here as a referral from Dr. Arcelia Elliott for progressive gait ataxia, left hand pain and weakness. Ms. Swiech is a 60yo woman with PMHx of HTN, systolic CHF, GERD, Type 2 DM,  and hyperlipidemia who presents today for the following: Progressive spasticity, weakness, falls, painful paresthesias unintentional significant weight loss decreased  mobility. She was scheduled for an EMG nerve conduction today however MRI of the cervical cord today came back significant for T2 hyperintense lesion extending from C4-5 to inferior C7 level with focal spinal cord enhancement at C5-6 level. The spinal cord appears focally enlarged at the C6 level. May represent focal autoimmune, inflammatory, demyelinating, neoplastic or para-infectious etiologies. There is adjacent degenerative disc disease with mild spinal stenosis, and therefore compressive etiology may be considered. However the degree of abnormal cord signal appears to be out of proportion to the degree of compression, and therefore would consider workup of other etiologies.  Discussed at length with patient and son and further workup.  Interval update 03/01/2015: Julie Elliott is a 60 y.o. female here as a follow up. Here with her son.   She is still losing weight. She lost 12-14 pounds in a 2 week period. She is losing muscle mass now. She can't stand up. She can't walk for a long period of time. Her PT and OT was discontinued. Patient reports that they told her they were wasting her time. No incontinence. No problems swallowing. No PBA. No weakness in facial muscles. No muscle cramping. Still with left-sided arm pain and weakness. Continuous pain. Stiffness in the legs.   Initial visit 12/21/2014: Julie Elliott is a 60 y.o. female here as a referral from Dr. Arcelia Elliott for progressive gait ataxia, left hand pain and weakness. Ms. Tauzin is a 60yo woman with PMHx of HTN, systolic CHF, GERD, Type 2 DM, and hyperlipidemia who presents today for the following:  She is having problems with the left hand. She has joint pain and out of nowhere the hand locked up. Stiffness, arthritic-type pain. Doesn't stop when she sleeps, the pain is continuous all day lone. Progressing and worsening. Left leg is weak. Also burning in the left leg. And coldness in both legs. They were in the ED January first, she felt like she  couldn't move. Her legs were cold. She has lost a lot of weight quickly, over 100 pounds in the last 6 months. She eats the same, good appetite. she was told that her symptoms were related to arthritis and diabetic neuropathy. One month later they were in the ED again, decreased mobility and ataxia. Now she needs a cane to walk. Issues with balance and near falls. She has developed a frozen shoulder. Progressive, incapaciting pain 10/10 with difficulty performing daily tasks such as cleaning, toileting, bathing, cooking. Symptoms are mostly on the left side but her right leg is now affected. Assymmetric. Right leg slightly involved. Weakness as well as pain mostly on the left. She gets very hot. The left hand symptoms were acute, the other symptoms have been insidious and have slowly progressed. She has to have someone bathe her now. Balance is poor. She has not fallen but very off balance. No injuries, no inciting factors. She had stoped taking her insulin in December because of her weight loss she no longer needs it. Son is resent with her and provides much of the information.   EMG/NCS showed severe CTS. Had emg/ncs at [facility].   Reviewed notes, labs and imaging from outside physicians, which showed:  DG Lumbar spine (reviewed images and agree with the following) : No evidence for fracture. Loss of disc height is seen at L3-4 and L5-S1 advanced facet osteoarthritis is seen bilaterally in the lower lumbar levels.  No worrisome lytic or sclerotic osseous abnormality.  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  DG Cervical Spine: The cervicothoracic junction is partially obscured by osseous and soft tissue overlap on the lateral view. Cervical spine alignment is maintained. Vertebral body heights are preserved. There is disc space narrowing at C4-C5, C5-C6, and C6-C7 with associated endplate spurs. There is bony neural foraminal narrowing at C5-C6  bilaterally. The dens is intact. Posterior elements appear well-aligned. There is no evidence of fracture. No prevertebral soft tissue edema.  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 Bony neural foraminal narrowing at C5-C6. No acute bony abnormality.  DG Hip: There is no evidence of fracture. There is osteoarthritis of both hips with mild joint space narrowing and marginal osteophytes. Sacroiliac joints appear normal. The patient has a tendency towards calcification at tendon insertions.  IMPRESSION: No acute finding. Osteoarthritis of both hips.  DG Left hand complete: Evaluation of the digits is limited due to flexed positioning. No fracture or dislocation. Mild osteoarthritis of the thumb and index inger metacarpal phalangeal joints with joint space narrowing and osteophytes. The joint spaces are otherwise maintained. There are no erosions or periosteal reaction. No focal soft tissue abnormality.  DG Shoulder: Mild osteoarthritis. No evidence of inflammatory arthropathy or acute bony abnormality.  No fracture or dislocation. The alignment and joint spaces are maintained. There is mild osteoarthritis at the acromioclavicular joint. There is inferior spurring from the glenoid. No definite abnormal soft tissue calcifications.  IMPRESSION: Acromioclavicular and glenohumeral osteoarthritis. No acute bony abnormality.  She was seen in the ED in February for shoulder pain and limitations in movement. She was referred to Orthopaedics. Per notes, She also has hx of leg pain, knee pain, and hip pain which has been attributed to diabetic neuropathy and arthritis in the past. Has unbalance. CXR hip showed OA of both hips and mild joint space narrowing and osteophytes. SI joint was normal. Lumbar spine, degeneration disc And facet disease onf L3-L4, L5-S1. Dxed with possible frozen shoulder 2/2 to osteoarthritis complicated by rotator cuff tear vs bursitis   She was  seen in the ED in January for bilateral cold lower extremities and worsening of her paresthesias, acute onset of cold feet and burning sensation. Exam noted Patient's legs are cool to the touch. Bilateral ABI was within normal limits. Dxed with likely peripheral neuropathy from her diabetes and possible decreased arterial blood flow to the legs.  HgbA1c 6.8 06/2014   Social History   Social History  . Marital Status: Single    Spouse Name: N/A  . Number of Children: 3  . Years of Education: 12   Occupational History  . Unemployed    Social History Main Topics  . Smoking status: Current Some Day Smoker -- 0.30 packs/day    Types: Cigarettes    Last Attempt to Quit: 04/18/2013  . Smokeless tobacco: Not on file     Comment: Smokes 5-6 cigerettes per day, 12/21/14 smokes rarely under stressful situations  . Alcohol Use: No  . Drug Use: No  . Sexual Activity: Not on file   Other Topics Concern  . Not on file   Social History Narrative   to get supplies nfrom [facility] per pt request.1--[phone]   Lives at home with son.    Right handed.   Caffeine use: drinks coffee-rare    Family History  Problem Relation Age of Onset  . Diabetes Mother   . Heart disease Mother   .  Prostate cancer Father   . Neuropathy Neg Hx     Past Medical History  Diagnosis Date  . Hypertension     well controlled  . DJD (degenerative joint disease)   . HLD (hyperlipidemia) 2008    stable, well controlled  . DM (diabetes mellitus) ([place]) 2008    stable HgBA1C at 6.5  . GERD (gastroesophageal reflux disease)     well controlled on Omeprazole  . Chronic headaches   . CHF (congestive heart failure) [facility])     Past Surgical History  Procedure Laterality Date  . Tubal ligation    . Carpel tunnel Right     20 yrs ago  . Cervical spine surgery  2016    Current Outpatient Prescriptions  Medication Sig Dispense Refill  . aspirin 81 MG EC tablet Take 1 tablet (81 mg total) by mouth daily. 90  tablet 1  . Calcium-Vitamin D 600-125 MG-UNIT TABS Take 1 tablet by mouth daily. 90 each 1  . senna-docusate (SENOKOT-S) 8.6-50 MG tablet Take 2 tablets by mouth.    . simvastatin (ZOCOR) 40 MG tablet TAKE 1 TABLET ONCE DAILY. 90 tablet 4  . gabapentin (NEURONTIN) 300 MG capsule Take 1 capsule (300 mg total) by mouth 3 (three) times daily. (Patient not taking: Reported on 09/12/2015) 90 capsule 1  . methocarbamol (ROBAXIN) 500 MG tablet Take 1 tablet (500 mg total) by mouth every 6 (six) hours as needed for muscle spasms. (Patient not taking: Reported on 09/12/2015) 30 tablet 1  . naproxen (NAPROSYN) 500 MG tablet TAKE 1 TABLET TWICE DAILY. (Patient not taking: Reported on 09/12/2015) 180 tablet 0  . potassium chloride SA (K-DUR,KLOR-CON) 20 MEQ tablet TAKE 1 TABLET ONCE DAILY. (Patient not taking: Reported on 09/12/2015) 90 tablet 1   No current facility-administered medications for this visit.    Allergies as of 09/12/2015 - Review Complete 09/12/2015  Allergen Reaction Noted  . Loratadine-pseudoephedrine er Hives     Vitals: BP 120/75 mmHg  Pulse 87  Ht _0  (1.6 m)  Wt 171 lb (77.565 kg)  BMI 30.30 kg/m2 Last Weight:  Wt Readings from Last 1 Encounters:  09/12/15 171 lb (77.565 kg)   Last Height:   Ht Readings from Last 1 Encounters:  09/12/15 _1  (1.6 m)    Speech:  Speech is normal; fluent and spontaneous with normal comprehension.  Cognition:  The patient is oriented to person, place, and time;   recent and remote memory intact;   language fluent;   normal attention, concentration,   fund of knowledge Cranial Nerves:  The pupils are equal, round, and reactive to light. The fundi are flat. Visual fields are full to finger confrontation. Extraocular movements are intact. Trigeminal sensation is intact and the muscles of mastication are normal. The face is symmetric. The palate elevates in the midline. Hearing intact. Voice is normal. Shoulder shrug is  normal. The tongue has normal motion without fasciculations.   Coordination:  No dysmetria with right arm, difficult to perform with other extremities due to weakness  Gait:  spastic gait, slow, walking aids needed to ambulate  Motor Observation: assymetry of the left lower extremitiy without edema, left ankle larger circumference, some muscle wasting.  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 Mild increased tone right arm.  Posture:  Posture is mildly stooped   Strength: 4/5 strength proximally in the right arm with increased tone. 3/5 hip flexion weakness bilaterally with  bilateral lower extremity spasticity, left arm weakness proximally with significant spasticity more distally.    Sensation: right leg decreased pp to mid calf Left leg decreased pp to the ankle Vibration 3 seconds on the left and 5 second on the right at thegreat toes Proprioception intact bilaterally Decreased temp to below knees bilaterally    Reflex Exam:  DTR's: Brick biceps, patellars and achilles for age and medical conditions   Toes:  Left toe upgoing  Clonus:  Clonus is absent.    Assessment/Plan: Julie Elliott is a wonderful 60 y.o. female here as a referral from Dr. Arcelia Elliott for progressive gait ataxia and spasticity, left hand pain and paresis, lower extremity bilateral spastic paresis. Progressive spasticity, weakness, falls, painful paresthesias unintentional significant weight loss of over 100 pounds ,decreased mobility. Exam with decreased sensation distally, weakness necessitating wheelchair, increased tone in the lower extremities with left upgoing toe, very brisk reflexes for age and medical conditions. 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   - Will refer to Dr. Letta Pate and Dr. Naaman Plummer for physical rehabilitation as well as evaluation for Botox for spasticity. - Have tried Baclofen for her spasticity, she felt it made her too tired. Discussed valium for her spasms, she declines. She needs physical therapy asap and hopefully botox for her spasticity.  - continue Lyrica for pain - For neurogenic bowel, I have recommended suppository at 9am when her aid is there. She takes oral stool softener. Discussed enemas too.    Sarina Ill, MD  [facility] [facility] [address] [place], [place] [address]  Phone [phone] Fax [phone]  A total of 45 minutes was spent face-to-face with this patient. Over half this time was spent on counseling patient on the diagnosis and different diagnostic and therapeutic options available.

## 2015-09-17 ENCOUNTER — Telehealth: Payer: Self-pay | Admitting: *Deleted

## 2015-09-17 ENCOUNTER — Telehealth: Payer: Self-pay | Admitting: Neurology

## 2015-09-17 NOTE — Telephone Encounter (Signed)
Called Julie Elliott back. Advised part where he needs to fill out paperwork is not completed. We also need payment for form. Advised I will leave up front for him to complete. He is going to stop by today to sign/make payment.

## 2015-09-17 NOTE — Telephone Encounter (Signed)
Julie Elliott [phone] called regarding paperwork, his employer is saying they haven't received the paperwork.

## 2015-09-17 NOTE — Telephone Encounter (Signed)
Patient form faxed to [facility] on 09/17/15.

## 2015-09-19 ENCOUNTER — Telehealth: Payer: Self-pay

## 2015-09-19 DIAGNOSIS — Z0289 Encounter for other administrative examinations: Secondary | ICD-10-CM

## 2015-09-19 NOTE — Telephone Encounter (Signed)
Spoke with April at [facility] about referral.  She will try to get the process going.

## 2015-09-25 ENCOUNTER — Telehealth: Payer: Self-pay | Admitting: Neurology

## 2015-09-25 NOTE — Telephone Encounter (Signed)
Dana: Can you call and talk to [place] at Dr. Dianna Limbo office. Ms. Allenbaugh still has not been contacted. I emailed with Dr. Read Drivers and he said Zac may be taking care of this?  thank you !!

## 2015-09-26 NOTE — Telephone Encounter (Signed)
Called and spoke to [place] at [facility] medicine [facility] rehab and asked about patient's apt. Corene Cornea relayed he was working on the apt. [phone]. And he is going to call the patient . Dr. Jaynee Eagles I did sent to [facility] and I routed back to [facility] so that was my mistake.  Called patient and explained to her she understood she was fine I gave patient there phone number and relayed to her to call me back if she has  not scheduled by this coming Friday.

## 2015-09-27 NOTE — Telephone Encounter (Signed)
Thank you Hinton Dyer! Would you check with Charles tomorrow and make sure she got the appointment? Thanks!

## 2015-10-01 NOTE — Telephone Encounter (Signed)
Patient has apt . March 17 th  With [facility] physical medicine and rehab. Patient is aware.

## 2015-10-15 ENCOUNTER — Ambulatory Visit: Payer: Medicare Other | Admitting: Podiatry

## 2015-10-19 ENCOUNTER — Encounter: Payer: Self-pay | Admitting: Physical Medicine & Rehabilitation

## 2015-10-19 ENCOUNTER — Ambulatory Visit (HOSPITAL_BASED_OUTPATIENT_CLINIC_OR_DEPARTMENT_OTHER): Payer: Medicare Other | Admitting: Physical Medicine & Rehabilitation

## 2015-10-19 ENCOUNTER — Encounter: Payer: Medicare Other | Attending: Physical Medicine & Rehabilitation

## 2015-10-19 VITALS — BP 125/72 | HR 91

## 2015-10-19 DIAGNOSIS — I10 Essential (primary) hypertension: Secondary | ICD-10-CM | POA: Diagnosis not present

## 2015-10-19 DIAGNOSIS — E785 Hyperlipidemia, unspecified: Secondary | ICD-10-CM | POA: Insufficient documentation

## 2015-10-19 DIAGNOSIS — I509 Heart failure, unspecified: Secondary | ICD-10-CM | POA: Diagnosis not present

## 2015-10-19 DIAGNOSIS — Z9889 Other specified postprocedural states: Secondary | ICD-10-CM | POA: Insufficient documentation

## 2015-10-19 DIAGNOSIS — R51 Headache: Secondary | ICD-10-CM | POA: Diagnosis not present

## 2015-10-19 DIAGNOSIS — F17210 Nicotine dependence, cigarettes, uncomplicated: Secondary | ICD-10-CM | POA: Diagnosis not present

## 2015-10-19 DIAGNOSIS — K219 Gastro-esophageal reflux disease without esophagitis: Secondary | ICD-10-CM | POA: Insufficient documentation

## 2015-10-19 DIAGNOSIS — E119 Type 2 diabetes mellitus without complications: Secondary | ICD-10-CM | POA: Diagnosis not present

## 2015-10-19 DIAGNOSIS — G8250 Quadriplegia, unspecified: Secondary | ICD-10-CM | POA: Insufficient documentation

## 2015-10-19 DIAGNOSIS — M1990 Unspecified osteoarthritis, unspecified site: Secondary | ICD-10-CM | POA: Diagnosis not present

## 2015-10-19 MED ORDER — TIZANIDINE HCL 4 MG PO TABS: 4.0000 mg | ORAL_TABLET | Freq: Three times a day (TID) | ORAL | Status: DC

## 2015-10-19 MED ORDER — TIZANIDINE HCL 2 MG PO TABS: 2.0000 mg | ORAL_TABLET | Freq: Three times a day (TID) | ORAL | Status: DC

## 2015-10-19 NOTE — Patient Instructions (Signed)
We will first do a Botox injection to the left arm and forearm  We will start tizanidine which is also called Zanaflex 2 mg 3 times per day watch out for drowsiness or dizziness  In the future we may need to increase the Lyrica depending on your response to the medicines above  After the Botox I plan to send you for some occupational therapy

## 2015-10-19 NOTE — Progress Notes (Signed)
Subjective:    Patient ID: Julie Elliott, female    DOB: 08/01/1956, 60 y.o.   MRN: [id number]  HPI  CC:  Spasms of Left hand and wrist  60 yo female with > 1 yr hx of progressive Left hand weakness and spasms who initially presented to PCP and ED adn was referred to Orthopedics for further w/u.  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  Because of further progression of symptoms as well as increasing LE weakness pt was referred to Neuro in May 2016 and further w/u was prsued.  C spine MRI revealed severe cervical central stenosis as well as foraminal stenosis as well as cord changes primarily at C5-6.  Pt was referred to neurosurgery at [facility] and underwent C4-7 ACDF on 05/22/2015.  Post op had improvement in LLE weakness as well as partial improvement of UE weakness however pt still had hypersensitivity to touch.  She  Was started on Lyrica per Neuro which has helped pain at 75mg  BID dose. Remembers being on Baclofen which was not helpful, has not tried tizanidine Had PT prior to surgery  In addition pt had 100lb wt loss has been through extensive work up for immunologic, infectious and neoplastic disorders which has been essentially negative. Pain Inventory Average Pain 7 Pain Right Now 7 My pain is constant, sharp, burning, dull, tingling and aching  In the last 24 hours, has pain interfered with the following? General activity 1 Relation with others 10 Enjoyment of life 10 What TIME of day is your pain at its worst? all Sleep (in general) Good  Pain is worse with: inactivity and some activites Pain improves with: medication Relief from Meds: not answered  Mobility walk with assistance use a cane ability to climb steps?  yes do you drive?  no use a wheelchair needs help with transfers  Function disabled: date disabled 1994 I need assistance with the following:  dressing, bathing, toileting, meal prep, household duties and  shopping  Neuro/Psych weakness numbness tremor tingling trouble walking spasms dizziness  Prior Studies Any changes since last visit?  no  Physicians involved in your care Any changes since last visit?  no   Family History  Problem Relation Age of Onset  . Diabetes Mother   . Heart disease Mother   . Prostate cancer Father   . Neuropathy Neg Hx    Social History   Social History  . Marital Status: Single    Spouse Name: N/A  . Number of Children: 3  . Years of Education: 12   Occupational History  . Unemployed    Social History Main Topics  . Smoking status: Current Some Day Smoker -- 0.30 packs/day    Types: Cigarettes    Last Attempt to Quit: 04/18/2013  . Smokeless tobacco: None     Comment: Smokes 5-6 cigerettes per day, 12/21/14 smokes rarely under stressful situations  . Alcohol Use: No  . Drug Use: No  . Sexual Activity: Not Asked   Other Topics Concern  . None   Social History Narrative   to get supplies nfrom [facility] [employer] per pt request.1--[phone]   Lives at home with son.    Right handed.   Caffeine use: drinks coffee-rare   Past Surgical History  Procedure Laterality Date  . Tubal ligation    . Carpel tunnel Right     20 yrs ago  . Cervical spine surgery  2016   Past Medical History  Diagnosis Date  . Hypertension  well controlled  . DJD (degenerative joint disease)   . HLD (hyperlipidemia) 2008    stable, well controlled  . DM (diabetes mellitus) ([place]) 2008    stable HgBA1C at 6.5  . GERD (gastroesophageal reflux disease)     well controlled on Omeprazole  . Chronic headaches   . CHF (congestive heart failure) (HCC)    BP 125/72 mmHg  Pulse 91  SpO2 99%  Opioid Risk Score:   Fall Risk Score:  `1  Depression screen PHQ 2/9  Depression screen [facility] 2/9 08/14/2015 02/27/2015 10/17/2014 09/12/2014 06/20/2014 01/03/2014 01/03/2014  Decreased Interest 0 0 0 0 0 - 0  Down, Depressed, Hopeless 0 0 0 0 0 0 0  PHQ - 2 Score 0 0 0 0 0 0  0     Review of Systems  Constitutional: Positive for unexpected weight change.  Skin: Positive for rash.  All other systems reviewed and are negative.      Objective:   Physical Exam  Constitutional: She appears well-developed and well-nourished.  HENT:  Head: Normocephalic and atraumatic.  Eyes: Conjunctivae and EOM are normal. Pupils are equal, round, and reactive to light.  Neck:  Cervical range of motion is limited to 50% flexion extension lateral bending and rotation.  Well-healed left anterior cervical incision.  Neurological: Gait normal.  3 minus bilateral deltoid, range of motion restrictions  4/5 right biceps and triceps. 4/5 in the finger flexors and extensors 3+ in the hand interossei on the right side.  3 minus right HF, 4+ KE , ankle DF Ashworth 3 at the DIPs and PIPs, FPL Ashworth 0 wrist flexor Ashworth 3 on the left  3 minus bilateral hip flexors 5 bilateral knee extensors 5 right ankle dorsiflexor 3 minus left ankle dorsiflexor  Sensation hypersensitive bilateral C8 as well as left C7 to pinprick intact light touch bilateral upper limbs  Nursing note and vitals reviewed.  Pt can ambulate with standby assist but with short step length and widened base of support  Left pectoralis, Ashworth 4      Assessment & Plan:  1.  Spastic quadriparesis with LUE mos affected. Rec trial of tizanidine 2mg  TID Schedule forBotox Left  FDS 50 FDP 50 FCU 25 FCR 50 PT 25 Pect 100  Will need post inj OT Discussed with PT asnd son

## 2015-11-02 ENCOUNTER — Encounter: Payer: Self-pay | Admitting: Physical Medicine & Rehabilitation

## 2015-11-02 ENCOUNTER — Ambulatory Visit (HOSPITAL_BASED_OUTPATIENT_CLINIC_OR_DEPARTMENT_OTHER): Payer: Medicare Other | Admitting: Physical Medicine & Rehabilitation

## 2015-11-02 VITALS — BP 130/73 | HR 102 | Resp 14

## 2015-11-02 DIAGNOSIS — Z9889 Other specified postprocedural states: Secondary | ICD-10-CM | POA: Diagnosis not present

## 2015-11-02 DIAGNOSIS — E785 Hyperlipidemia, unspecified: Secondary | ICD-10-CM | POA: Diagnosis not present

## 2015-11-02 DIAGNOSIS — M1990 Unspecified osteoarthritis, unspecified site: Secondary | ICD-10-CM | POA: Diagnosis not present

## 2015-11-02 DIAGNOSIS — G8250 Quadriplegia, unspecified: Secondary | ICD-10-CM | POA: Insufficient documentation

## 2015-11-02 DIAGNOSIS — F17210 Nicotine dependence, cigarettes, uncomplicated: Secondary | ICD-10-CM | POA: Diagnosis not present

## 2015-11-02 DIAGNOSIS — I10 Essential (primary) hypertension: Secondary | ICD-10-CM | POA: Diagnosis not present

## 2015-11-02 NOTE — Progress Notes (Signed)
Botox Injection for spasticity using needle EMG guidance  Dilution: 50 Units/ml Indication: Severe spasticity which interferes with ADL,mobility and/or  hygiene and is unresponsive to medication management and other conservative care Informed consent was obtained after describing risks and benefits of the procedure with the patient. This includes bleeding, bruising, infection, excessive weakness, or medication side effects. A REMS form is on file and signed. Needle: 27g1 needle electrode Number of units per muscle FDS 50 FDP 50 FCU 25 FCR 50 PT 25 Pect 100 All injections were done after obtaining appropriate EMG activity and after negative drawback for blood. The patient tolerated the procedure well. Post procedure instructions were given. A followup appointment was made.

## 2015-11-02 NOTE — Patient Instructions (Signed)

## 2015-11-05 ENCOUNTER — Telehealth: Payer: Self-pay | Admitting: Internal Medicine

## 2015-11-05 NOTE — Telephone Encounter (Signed)
APPT. REMINDER CALL, LMTCB °

## 2015-11-06 ENCOUNTER — Encounter: Payer: Self-pay | Admitting: Internal Medicine

## 2015-11-06 ENCOUNTER — Ambulatory Visit (INDEPENDENT_AMBULATORY_CARE_PROVIDER_SITE_OTHER): Payer: Medicare Other | Admitting: Internal Medicine

## 2015-11-06 VITALS — BP 124/81 | HR 96 | Temp 98.7°F | Ht 63.0 in | Wt 175.6 lb

## 2015-11-06 DIAGNOSIS — I152 Hypertension secondary to endocrine disorders: Secondary | ICD-10-CM

## 2015-11-06 DIAGNOSIS — Z720 Tobacco use: Secondary | ICD-10-CM

## 2015-11-06 DIAGNOSIS — Z0000 Encounter for general adult medical examination without abnormal findings: Secondary | ICD-10-CM

## 2015-11-06 DIAGNOSIS — F17210 Nicotine dependence, cigarettes, uncomplicated: Secondary | ICD-10-CM | POA: Diagnosis not present

## 2015-11-06 DIAGNOSIS — E1159 Type 2 diabetes mellitus with other circulatory complications: Secondary | ICD-10-CM | POA: Diagnosis not present

## 2015-11-06 DIAGNOSIS — I10 Essential (primary) hypertension: Principal | ICD-10-CM

## 2015-11-06 DIAGNOSIS — G800 Spastic quadriplegic cerebral palsy: Secondary | ICD-10-CM

## 2015-11-06 DIAGNOSIS — E1142 Type 2 diabetes mellitus with diabetic polyneuropathy: Secondary | ICD-10-CM

## 2015-11-06 DIAGNOSIS — G609 Hereditary and idiopathic neuropathy, unspecified: Secondary | ICD-10-CM

## 2015-11-06 DIAGNOSIS — G8250 Quadriplegia, unspecified: Secondary | ICD-10-CM

## 2015-11-06 NOTE — Progress Notes (Signed)
   Subjective:    Patient ID: Julie Elliott, female    DOB: February 08, 1956, 60 y.o.   MRN: [id number]  HPI Ms. Rackard is a 60yo woman with PMHx of HTN, type 2 DM, peripheral neuropathy, and recent compressive myelopathy s/p ACDF who presents today for follow up of her hypertension.  HTN: BP well controlled at 124/81. She is currently not on any antihypertensives.  Peripheral Neuropathy: She reports the Gabapentin did not help her and that she switched back to Lyrica. She is taking Lyrica 75 mg BID.   Spastic Quadriparesis: Her spasticity is mostly in her left upper extremity, predominantly the left hand. She saw Dr. Jaynee Eagles (neurology) in Feb and was referred to Dr. Letta Pate (physical medicine) who performed botox injections and had her try zanaflex. She reports the spasms have improved significantly. She is scheduled to get her next round of botox injections in 6 weeks. She reports taking the zanaflex only once daily as it makes her "feel weird and unbalanced." She is able to walk with a cane. She will also be starting physical therapy this week.   Tobacco Abuse: She reports she cut down on her smoking. She reports smoking 1-2 cigarettes "every now and then." When I asked her how often this occurs she states less than once a week.    Review of Systems General: Denies fever, chills, night sweats, changes in appetite HEENT: Denies headaches, ear pain, changes in vision, rhinorrhea, sore throat CV: Denies CP, palpitations, SOB, orthopnea Pulm: Denies SOB, cough, wheezing GI: Denies abdominal pain, nausea, vomiting, diarrhea, constipation, melena, hematochezia GU: Denies dysuria, hematuria, frequency Msk: Denies joint pains Neuro: See HPI Skin: Denies rashes, bruising Psych: Denies depression, anxiety, hallucinations    Objective:   Physical Exam General: alert, sitting up, pleasant, NAD HEENT: [place]/AT, EOMI, sclera anicteric, mucus membranes moist CV: RRR, no m/g/r Pulm: CTA bilaterally, breaths  non-labored Abd: BS+, soft, non-tender Ext: warm, no peripheral edema Neuro: alert and oriented x 3. Left hand- fingers flexed at PIPs and DIPs, stiff.      Assessment & Plan:  Please refer to A&P documentation.

## 2015-11-06 NOTE — Assessment & Plan Note (Signed)
Will continue Lyrica 75 mg BID as this seems to work best for her peripheral neuropathy.

## 2015-11-06 NOTE — Assessment & Plan Note (Signed)
Advised her to get an eye exam.

## 2015-11-06 NOTE — Patient Instructions (Signed)
-   Please schedule an eye exam  - I will see you back in 6 months unless anything else comes up!  General Instructions:   Please bring your medicines with you each time you come to clinic.  Medicines may include prescription medications, over-the-counter medications, herbal remedies, eye drops, vitamins, or other pills.   Progress Toward Treatment Goals:  Treatment Goal 02/27/2015  Hemoglobin A1C at goal  Blood pressure at goal    Self Care Goals & Plans:  Self Care Goal 11/06/2015  Manage my medications take my medicines as prescribed; bring my medications to every visit; refill my medications on time  Monitor my health keep track of my blood pressure; check my feet daily  Eat healthy foods eat more vegetables; eat foods that are low in salt; eat baked foods instead of fried foods; eat smaller portions  Be physically active find an activity I enjoy  Stop smoking set a quit date and stop smoking    Home Blood Glucose Monitoring 06/20/2014  Check my blood sugar once a day  When to check my blood sugar -     Care Management & Community Referrals:  Referral 10/17/2014  Referrals made for care management support nutritionist

## 2015-11-06 NOTE — Assessment & Plan Note (Signed)
I am happy to see that the botox injections have helped her spasticity tremendously. She will continue to follow with Dr. Joan Mayans and I recommended for her to continue the zanaflex once daily. I advised her to stop the zanaflex if she encounters falls or if it is causing her excessive dizziness.

## 2015-11-06 NOTE — Assessment & Plan Note (Signed)
I think her excessive weight loss has ultimately "cured" her hypertension. Will continue to monitor her BP at every visit but she is no longer hypertensive.

## 2015-11-06 NOTE — Assessment & Plan Note (Signed)
She reports decreased cigarette use. I encouraged her to quit completely. She reports she has no intentions to increase her use or become a "habitual smoker."

## 2015-11-07 NOTE — Progress Notes (Signed)
Internal Medicine Clinic Attending  Case discussed with Dr. Rivet at the time of the visit.  We reviewed the resident's history and exam and pertinent patient test results.  I agree with the assessment, diagnosis, and plan of care documented in the resident's note.  

## 2015-11-08 ENCOUNTER — Ambulatory Visit: Payer: Medicare Other | Attending: Physical Medicine & Rehabilitation | Admitting: Occupational Therapy

## 2015-11-08 ENCOUNTER — Encounter: Payer: Self-pay | Admitting: Occupational Therapy

## 2015-11-08 DIAGNOSIS — M6281 Muscle weakness (generalized): Secondary | ICD-10-CM | POA: Diagnosis not present

## 2015-11-08 DIAGNOSIS — M25641 Stiffness of right hand, not elsewhere classified: Secondary | ICD-10-CM | POA: Insufficient documentation

## 2015-11-08 DIAGNOSIS — M25642 Stiffness of left hand, not elsewhere classified: Secondary | ICD-10-CM | POA: Diagnosis not present

## 2015-11-08 DIAGNOSIS — M25621 Stiffness of right elbow, not elsewhere classified: Secondary | ICD-10-CM | POA: Insufficient documentation

## 2015-11-08 DIAGNOSIS — M25612 Stiffness of left shoulder, not elsewhere classified: Secondary | ICD-10-CM | POA: Diagnosis not present

## 2015-11-08 DIAGNOSIS — M25542 Pain in joints of left hand: Secondary | ICD-10-CM | POA: Insufficient documentation

## 2015-11-08 DIAGNOSIS — M25512 Pain in left shoulder: Secondary | ICD-10-CM | POA: Insufficient documentation

## 2015-11-08 DIAGNOSIS — M25611 Stiffness of right shoulder, not elsewhere classified: Secondary | ICD-10-CM | POA: Diagnosis not present

## 2015-11-08 NOTE — Therapy (Signed)
[facility] [address] [place], [place], [address] Phone: [phone]   Fax:  [phone]  Occupational Therapy Evaluation  Patient Details  Name: Julie Elliott MRN: [id number] Date of Birth: September 21, 1955 Referring Provider: Dr, Julie Elliott  Encounter Date: 11/08/2015      OT End of Session - 11/08/15 1601    Visit Number 1   Number of Visits 16   Date for OT Re-Evaluation 01/03/16   Authorization Type MCR primary, MCD secondary, will need G code and PN every 10th visit   Authorization Time Period 60 days   Authorization - Visit Number 1   Authorization - Number of Visits 10   OT Start Time [id number]   OT Stop Time 1525   OT Time Calculation (min) 40 min   Activity Tolerance Patient tolerated treatment well      Past Medical History  Diagnosis Date  . Hypertension     well controlled  . DJD (degenerative joint disease)   . HLD (hyperlipidemia) 2008    stable, well controlled  . DM (diabetes mellitus) ([place]) 2008    stable HgBA1C at 6.5  . GERD (gastroesophageal reflux disease)     well controlled on Omeprazole  . Chronic headaches   . CHF (congestive heart failure) [facility])     Past Surgical History  Procedure Laterality Date  . Tubal ligation    . Carpel tunnel Right     20 yrs ago  . Cervical spine surgery  2016    There were no vitals filed for this visit.  Visit Diagnosis:  Stiffness of left shoulder, not elsewhere classified - Plan: Ot plan of care cert/re-cert  Stiffness of right shoulder, not elsewhere classified - Plan: Ot plan of care cert/re-cert  Stiffness of right elbow, not elsewhere classified - Plan: Ot plan of care cert/re-cert  Stiffness of right hand, not elsewhere classified - Plan: Ot plan of care cert/re-cert  Pain in left shoulder - Plan: Ot plan of care cert/re-cert  Pain in joint of left hand - Plan: Ot plan of care cert/re-cert  Muscle weakness (generalized) - Plan: Ot plan  of care cert/re-cert      Subjective Assessment - 11/08/15 1454    Subjective  My left hand is worse than the right   Patient is accompained by: Family member  son   Patient Stated Goals I want my hands back   Currently in Pain? No/denies           [facility] OT Assessment - 11/08/15 0001    Assessment   Diagnosis spastic quadrapesis from severe cervical stenosis and s/p ACDF C4-C7   Referring Provider Dr, Julie Elliott   Onset Date 05/22/15   Prior Therapy HHPT and OT   Precautions   Precautions Fall   Restrictions   Weight Bearing Restrictions No   Balance Screen   Has the patient fallen in the past 6 months No  gait is very slow and unsteady   Home  Environment   Family/patient expects to be discharged to: Private residence   [name]  adult son [name]   Available Help at Discharge Available PRN/intermittently   Type of [place]  Lead One level   Bathroom Shower/Tub Walk-in Shower   Additional Comments :Pt has aide who assist with bathing and dressing and helps with meals and household chores.  Grab bars with shower seat with grab bars and 3 in  1 around toilet   Prior Function   Level of Independence Independent   Vocation On disability   ADL   Eating/Feeding Minimal assistance  for cutting only   Grooming Independent   Upper Body Bathing Maximal assistance   Lower Body Bathing Maximal assistance   Upper Body Dressing Maximal assistance   Lower Body Dressing Maximal assistance   Toilet Tranfer Modified independent   Toileting - Clothing Manipulation Modified independent   [place] Transfer Supervision/safety   IADL   Shopping Assistance for transportation;Needs to be accompanied on any shopping trip   Light Housekeeping Does not participate in any housekeeping tasks   Meal Prep Able to complete simple warm meal prep   Community Mobility Relies on family or  friends for transportation   Medication Management Is responsible for taking medication in correct dosages at correct time   Mobility   Mobility Status Needs assist   Mobility Status Comments needs supervision to occassional min to ambulate in community   Written Expression   Dominant Hand Right   Vision - History   Baseline Vision Wears glasses all the time   Activity Tolerance   Activity Tolerance Tolerate 30+ min activity without fatigue   Cognition   Overall Cognitive Status Within Functional Limits for tasks assessed   Sensation   Light Touch Appears Intact   Coordination   Gross Motor Movements are Fluid and Coordinated No   Fine Motor Movements are Fluid and Coordinated No   Other Pt does not have enough isolated movement in either UE to complete standardized tests.    Edema   Edema mild in L hand   Tone   Assessment Location Left Upper Extremity   ROM / Strength   AROM / PROM / Strength AROM;PROM;Strength   AROM   Overall AROM  Deficits   Overall AROM Comments LUE: pt with approximately 50* shoulder abduction, 30* shoulder flexion, 50* elbow flexion, 115 elbow extension, trace wrist flexion and extension, 1/4 range of finger flexion/extension with pain and hypersensitivity to L hand.  RUE:  shoulder flexion to 90*, abduction to 100* and all other WFL's however with limitations. Pt can use her LUE for self feeding, clothing mgmt and hygience for toileting.    PROM   Overall PROM  Deficits   Overall PROM Comments LUE:  shoulder abduction to 60*, shoulder flexion to 40*, elbow flexion 50*, elbow extension 115*. See RUE above for functional movement.    Strength   Overall Strength Deficits;Unable to assess   Overall Strength Comments Unable to accurately asesss due to severe spasticity and muscle spasms   Hand Function   Comment Pt has functional use of R hand for simple ADL tasks however does not have enough AROM for functional use in L hand.    LUE Tone   LUE Tone Severe   Pt also with moderate tone in RUE.                             OT Short Term Goals - 11/08/15 1548    OT SHORT TERM GOAL #1   Title Pt and son will be mod I with HEP - 12/06/2015   Baseline dependent   Status New   OT SHORT TERM GOAL #2   Title Pt will tolerate manual techniques from a pain standpoint to be able to address improving ROM in LUE to avoid further contracture and  to reduce pain.   Baseline unable to tolerate   Status New   OT SHORT TERM GOAL #3   Title Pt will be able to use L hand as stabilizer for at least 2 functional tasks.   Baseline no functional use at this time   Status New           OT Long Term Goals - 2015/11/28 1551    OT LONG TERM GOAL #1   Title Pt and son will be mod I with upgraded HEP - 01/03/2016   Baseline dependent   Status New   OT LONG TERM GOAL #2   Title Assess current splint and revise as needed   Baseline Pt did not have splint on eval however reports current splint is hard to don   Status New   OT LONG TERM GOAL #3   Title Pt will demonstrate increased PROM of shoulder flexion to 50* and shoulder abduction to 65* for ease in self care activities   Baseline shoulder flexion 40, abduction 60   Status New   OT LONG TERM GOAL #4   Title Pt will report increased relaxation in LUE and decreased intensity in spasm of LUE to reduce pain and discmfort   Baseline Reports severe currently   Status New               Plan - November 28, 2015 1554    Clinical Impression Statement Pt is a 60 year old female s/p ACDF C4-C7 on 05/22/2015 with resultant spastic quadraplegia. PMH includes: severe cervical stenosis, DM, HLD, HTN, OA, peripheral neuropathy.  Pt is now s/p botox injection on 3/10/03/2015 with referral to follow up OT.  Pt presents with stiffness of B shoulders, decreased ROM in entire LUE, decresaed strength, decreased functional use, severe spasticity, and pain in entire LUE with any ROM/stretchng.  Pt will beneift from  skilled OT as follow up to botox injection to address ROM, tone, pain, development of  HEP for stretching and tone mgmt, as well as addressing any splinting needs. Pt in agreement with plan   Pt will benefit from skilled therapeutic intervention in order to improve on the following deficits (Retired) Decreased range of motion;Decreased strength;Difficulty walking;Increased edema;Impaired UE functional use;Impaired tone;Increased muscle spasms;Pain   Rehab Potential Fair   OT Frequency 2x / week   OT Duration 8 weeks   OT Treatment/Interventions Self-care/ADL training;Electrical Stimulation;Moist Heat;Fluidtherapy;Ultrasound;Therapeutic exercise;Neuromuscular education;DME and/or AE instruction;Passive range of motion;Manual Therapy;Splinting;Therapeutic exercises;Therapeutic activities;Patient/family education   Plan begin to address ROM via manual therapy in order to work toward development of HEP   Recommended Other Services recommneded referral to PT to address functional mobility and balance - will discuss with patient next visit.   Consulted and Agree with Plan of Care Patient;Family member/caregiver   Family Member Consulted son          G-Codes - 11-28-15 1610    Functional Assessment Tool Used skilled clinical observation   Functional Limitation Self care  ability to carry for LUE relative to ROM/ tone mgmt   Self Care Current Status [phone]) 100 percent impaired, limited or restricted   Self Care Goal Status [id number]) At least 60 percent but less than 80 percent impaired, limited or restricted   Self Care Discharge Status [phone]) At least 80 percent but less than 100 percent impaired, limited or restricted      Problem List Patient Active Problem List   Diagnosis Date Noted  . Spastic quadriparesis ([place]) 11/02/2015  . Tobacco abuse  08/16/2015  . Peripheral neuropathy ([place]) 07/17/2015  . Cervical cord compression with myelopathy ([place]) 02/08/2015  . Frozen shoulder--left  09/05/2014  . Unintentional weight loss 06/20/2014  . Health care maintenance 03/23/2012  . Hypertension associated with diabetes ([place]) 08/17/2006  . GERD 08/17/2006  . History of diabetes mellitus, type II 08/11/2006  . Hyperlipidemia associated with type 2 diabetes mellitus ([place]) 08/11/2006  . Osteoarthritis 08/11/2006    [name], OTR/L 11/08/2015, 4:13 PM  [facility] [address] [place], [place], [address] Phone: [phone]   Fax:  [phone]  Name: Julie Elliott MRN: [id number] Date of Birth: 1955/11/18

## 2015-11-12 ENCOUNTER — Ambulatory Visit: Payer: Medicare Other | Admitting: Occupational Therapy

## 2015-11-12 ENCOUNTER — Encounter: Payer: Self-pay | Admitting: Occupational Therapy

## 2015-11-12 DIAGNOSIS — M6281 Muscle weakness (generalized): Secondary | ICD-10-CM

## 2015-11-12 DIAGNOSIS — M25621 Stiffness of right elbow, not elsewhere classified: Secondary | ICD-10-CM

## 2015-11-12 DIAGNOSIS — M25612 Stiffness of left shoulder, not elsewhere classified: Secondary | ICD-10-CM

## 2015-11-12 DIAGNOSIS — M25542 Pain in joints of left hand: Secondary | ICD-10-CM | POA: Diagnosis not present

## 2015-11-12 DIAGNOSIS — M25512 Pain in left shoulder: Secondary | ICD-10-CM | POA: Diagnosis not present

## 2015-11-12 DIAGNOSIS — M25641 Stiffness of right hand, not elsewhere classified: Secondary | ICD-10-CM

## 2015-11-12 DIAGNOSIS — M25611 Stiffness of right shoulder, not elsewhere classified: Secondary | ICD-10-CM | POA: Diagnosis not present

## 2015-11-12 NOTE — Therapy (Signed)
[facility] [address] [place], [place], [address] Phone: [phone]   Fax:  [phone]  Occupational Therapy Treatment  Patient Details  Name: Julie Elliott MRN: [id number] Date of Birth: Mar 06, 1956 Referring Provider: Dr, Alysia Penna  Encounter Date: 11/12/2015      OT End of Session - 11/12/15 1741    Visit Number 2   Number of Visits 16   Date for OT Re-Evaluation 01/03/16   Authorization Type MCR primary, MCD secondary, will need G code and PN every 10th visit   Authorization Time Period 60 days   Authorization - Visit Number 2   Authorization - Number of Visits 10   OT Start Time [id number]   OT Stop Time 1614   OT Time Calculation (min) 42 min   Activity Tolerance Patient tolerated treatment well      Past Medical History  Diagnosis Date  . Hypertension     well controlled  . DJD (degenerative joint disease)   . HLD (hyperlipidemia) 2008    stable, well controlled  . DM (diabetes mellitus) ([place]) 2008    stable HgBA1C at 6.5  . GERD (gastroesophageal reflux disease)     well controlled on Omeprazole  . Chronic headaches   . CHF (congestive heart failure) [facility])     Past Surgical History  Procedure Laterality Date  . Tubal ligation    . Carpel tunnel Right     20 yrs ago  . Cervical spine surgery  2016    There were no vitals filed for this visit.      Subjective Assessment - 11/12/15 1540    Patient is accompained by: Family member  son   Patient Stated Goals I want my hands back   Currently in Pain? No/denies                      OT Treatments/Exercises (OP) - 11/12/15 0001    Exercises   Exercises Hand   Hand Exercises   Other Hand Exercises As able pt participated in [facility] for flexoin/exension of L fingers at all joints. Pt with some improvement by end of session.    Modalities   Modalities Moist Heat   Moist Heat Therapy   Number Minutes Moist Heat 8 Minutes   Moist Heat Location Hand  to reduce tightness prior to manual therapy   Manual Therapy   Manual Therapy Joint mobilization;Soft tissue mobilization;Passive ROM   Joint Mobilization joint mob, soft tissue mob and PROM to decrease tightness/begin to address PROM in L hand. Also used to desensitize L hand as pt with hypersensitivity in L hand.                    OT Short Term Goals - 11/12/15 1739    OT SHORT TERM GOAL #1   Title Pt and son will be mod I with HEP - 12/06/2015   Baseline dependent   Status On-going   OT SHORT TERM GOAL #2   Title Pt will tolerate manual techniques from a pain standpoint to be able to address improving ROM in LUE to avoid further contracture and to reduce pain.   Baseline unable to tolerate   Status On-going   OT SHORT TERM GOAL #3   Title Pt will be able to use L hand as stabilizer for at least 2 functional tasks.   Baseline no functional use at this time   Status On-going  OT Long Term Goals - 11/12/15 1739    OT LONG TERM GOAL #1   Title Pt and son will be mod I with upgraded HEP - 01/03/2016   Baseline dependent   Status On-going   OT LONG TERM GOAL #2   Title Assess current splint and revise as needed   Baseline Pt did not have splint on eval however reports current splint is hard to don   Status On-going   OT LONG TERM GOAL #3   Title Pt will demonstrate increased PROM of shoulder flexion to 50* and shoulder abduction to 65* for ease in self care activities   Baseline shoulder flexion 40, abduction 60   Status On-going   OT LONG TERM GOAL #4   Title Pt will report increased relaxation in LUE and decreased intensity in spasm of LUE to reduce pain and discmfort   Baseline Reports severe currently   Status On-going               Plan - 11/12/15 1740    Clinical Impression Statement Pt makign slow progress toward goals. Pt to bring existing splint to next session (forgot today). Pt has agreed to PT referral.   Rehab  Potential Fair   OT Frequency 2x / week   OT Duration 8 weeks   Plan manual therapy, PROM, AAROM, check splint    Consulted and Agree with Plan of Care Patient;Family member/caregiver   Family Member Consulted son      Patient will benefit from skilled therapeutic intervention in order to improve the following deficits and impairments:  Decreased range of motion, Decreased strength, Difficulty walking, Increased edema, Impaired UE functional use, Impaired tone, Increased muscle spasms, Pain  Visit Diagnosis: Stiffness of left shoulder, not elsewhere classified  Stiffness of right shoulder, not elsewhere classified  Stiffness of right elbow, not elsewhere classified  Stiffness of right hand, not elsewhere classified  Pain in left shoulder  Pain in joint of left hand  Muscle weakness (generalized)    Problem List Patient Active Problem List   Diagnosis Date Noted  . 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 Tobacco abuse 08/16/2015  . Peripheral neuropathy ([place]) 07/17/2015  . Cervical cord compression with myelopathy ([place]) 02/08/2015  . Frozen shoulder--left 09/05/2014  . Unintentional weight loss 06/20/2014  . Health care maintenance 03/23/2012  . Hypertension associated with diabetes ([place]) 08/17/2006  . GERD 08/17/2006  . History of diabetes mellitus, type II 08/11/2006  . Hyperlipidemia associated with type 2 diabetes mellitus ([place]) 08/11/2006  . Osteoarthritis 08/11/2006    [name], OTR/L 11/12/2015, 5:43 PM  [facility] [address] [place], [place], [address] Phone: [phone]   Fax:  [phone]  Name: Alverta Mcgorty MRN: [id number] Date of Birth: July 10, 1956

## 2015-11-13 ENCOUNTER — Ambulatory Visit: Payer: Medicare Other | Admitting: Occupational Therapy

## 2015-11-13 ENCOUNTER — Encounter: Payer: Self-pay | Admitting: Occupational Therapy

## 2015-11-13 ENCOUNTER — Other Ambulatory Visit: Payer: Self-pay | Admitting: Neurology

## 2015-11-13 DIAGNOSIS — M25542 Pain in joints of left hand: Secondary | ICD-10-CM

## 2015-11-13 DIAGNOSIS — M25611 Stiffness of right shoulder, not elsewhere classified: Secondary | ICD-10-CM

## 2015-11-13 DIAGNOSIS — M6281 Muscle weakness (generalized): Secondary | ICD-10-CM

## 2015-11-13 DIAGNOSIS — M25641 Stiffness of right hand, not elsewhere classified: Secondary | ICD-10-CM

## 2015-11-13 DIAGNOSIS — M25512 Pain in left shoulder: Secondary | ICD-10-CM

## 2015-11-13 DIAGNOSIS — IMO0002 Reserved for concepts with insufficient information to code with codable children: Secondary | ICD-10-CM

## 2015-11-13 DIAGNOSIS — M25612 Stiffness of left shoulder, not elsewhere classified: Secondary | ICD-10-CM

## 2015-11-13 DIAGNOSIS — M25621 Stiffness of right elbow, not elsewhere classified: Secondary | ICD-10-CM | POA: Diagnosis not present

## 2015-11-13 NOTE — Therapy (Signed)
[facility] [address] [place], [place], [address] Phone: [phone]   Fax:  [phone]  Occupational Therapy Treatment  Patient Details  Name: Julie Elliott MRN: [id number] Date of Birth: [birth year] Referring Provider: Dr, Alysia Penna  Encounter Date: 11/13/2015      OT End of Session - 11/13/15 1704    Visit Number 3   Number of Visits 16   Date for OT Re-Evaluation 01/03/16   Authorization Type MCR primary, MCD secondary, will need G code and PN every 10th visit   Authorization Time Period 60 days   Authorization - Visit Number 3   Authorization - Number of Visits 10   OT Start Time [id number]   OT Stop Time 1530   OT Time Calculation (min) 43 min   Activity Tolerance Patient tolerated treatment well      Past Medical History  Diagnosis Date  . Hypertension     well controlled  . DJD (degenerative joint disease)   . HLD (hyperlipidemia) 2008    stable, well controlled  . DM (diabetes mellitus) ([place]) 2008    stable HgBA1C at 6.5  . GERD (gastroesophageal reflux disease)     well controlled on Omeprazole  . Chronic headaches   . CHF (congestive heart failure) [facility])     Past Surgical History  Procedure Laterality Date  . Tubal ligation    . Carpel tunnel Right     20 yrs ago  . Cervical spine surgery  2016    There were no vitals filed for this visit.      Subjective Assessment - 11/13/15 1659    Subjective  I think my left hand feels looser.   Patient Stated Goals I want my hands back   Currently in Pain? No/denies                      OT Treatments/Exercises (OP) - 11/13/15 0001    Hand Exercises   Other Hand Exercises Pt able to now touch thumb to ring finger and has slowly improving arch in L hand. Pt with some ability for AAROM for finger flexon and extension.   Modalities   Modalities Ultrasound   Ultrasound   Ultrasound Location L palm   Ultrasound Parameters 20%,  3..3 HmZ, .08 w/cm x8 minutes   Ultrasound Goals Other (Comment)  reduce tightness in soft tissue in hand   Manual Therapy   Manual Therapy Joint mobilization;Soft tissue mobilization   Joint Mobilization joint mob and soft tissue mob to reduce tightness and gain AROM in L hand                   OT Short Term Goals - 11/13/15 1702    OT SHORT TERM GOAL #1   Title Pt and son will be mod I with HEP - 12/06/2015   Baseline dependent   Status On-going   OT SHORT TERM GOAL #2   Title Pt will tolerate manual techniques from a pain standpoint to be able to address improving ROM in LUE to avoid further contracture and to reduce pain.   Baseline unable to tolerate   Status On-going   OT SHORT TERM GOAL #3   Title Pt will be able to use L hand as stabilizer for at least 2 functional tasks.   Baseline no functional use at this time   Status On-going           OT Long Term Goals -  11/13/15 1703    OT LONG TERM GOAL #1   Title Pt and son will be mod I with upgraded HEP - 01/03/2016   Baseline dependent   Status On-going   OT LONG TERM GOAL #2   Title Assess current splint and revise as needed   Baseline Pt did not have splint on eval however reports current splint is hard to don   Status On-going   OT LONG TERM GOAL #3   Title Pt will demonstrate increased PROM of shoulder flexion to 50* and shoulder abduction to 65* for ease in self care activities   Baseline shoulder flexion 40, abduction 60   Status On-going   OT LONG TERM GOAL #4   Title Pt will report increased relaxation in LUE and decreased intensity in spasm of LUE to reduce pain and discmfort   Baseline Reports severe currently   Status On-going               Plan - 11/13/15 1703    Clinical Impression Statement Pt making slow progress toward goals.    Rehab Potential Fair   OT Frequency 2x / week   OT Duration 8 weeks   OT Treatment/Interventions Self-care/ADL training;Electrical Stimulation;Moist  Heat;Fluidtherapy;Ultrasound;Therapeutic exercise;Neuromuscular education;DME and/or AE instruction;Passive range of motion;Manual Therapy;Splinting;Therapeutic exercises;Therapeutic activities;Patient/family education   Plan make new splint for L hand, manual therapy, PROM, AAROM   Consulted and Agree with Plan of Care Patient      Patient will benefit from skilled therapeutic intervention in order to improve the following deficits and impairments:  Decreased range of motion, Decreased strength, Difficulty walking, Increased edema, Impaired UE functional use, Impaired tone, Increased muscle spasms, Pain  Visit Diagnosis: Stiffness of left shoulder, not elsewhere classified  Stiffness of right shoulder, not elsewhere classified  Stiffness of right elbow, not elsewhere classified  Stiffness of right hand, not elsewhere classified  Pain in left shoulder  Pain in joint of left hand  Muscle weakness (generalized)    Problem List Patient Active Problem List   Diagnosis Date Noted  . 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 Tobacco abuse 08/16/2015  . Peripheral neuropathy ([place]) 07/17/2015  . Cervical cord compression with myelopathy ([place]) 02/08/2015  . Frozen shoulder--left 09/05/2014  . Unintentional weight loss 06/20/2014  . Health care maintenance 03/23/2012  . Hypertension associated with diabetes ([place]) 08/17/2006  . GERD 08/17/2006  . History of diabetes mellitus, type II 08/11/2006  . Hyperlipidemia associated with type 2 diabetes mellitus ([place]) 08/11/2006  . Osteoarthritis 08/11/2006    [name], OTR/L 11/13/2015, 5:05 PM  [facility] [address] [place], [place], [address] Phone: [phone]   Fax:  [phone]  Name: Julie Elliott MRN: [id number] Date of Birth: 06-24-1956

## 2015-11-20 ENCOUNTER — Encounter: Payer: Self-pay | Admitting: Occupational Therapy

## 2015-11-20 ENCOUNTER — Ambulatory Visit: Payer: Medicare Other | Admitting: Occupational Therapy

## 2015-11-20 DIAGNOSIS — M25542 Pain in joints of left hand: Secondary | ICD-10-CM

## 2015-11-20 DIAGNOSIS — M6281 Muscle weakness (generalized): Secondary | ICD-10-CM

## 2015-11-20 DIAGNOSIS — M25512 Pain in left shoulder: Secondary | ICD-10-CM

## 2015-11-20 DIAGNOSIS — M25611 Stiffness of right shoulder, not elsewhere classified: Secondary | ICD-10-CM

## 2015-11-20 DIAGNOSIS — M25621 Stiffness of right elbow, not elsewhere classified: Secondary | ICD-10-CM | POA: Diagnosis not present

## 2015-11-20 DIAGNOSIS — M25612 Stiffness of left shoulder, not elsewhere classified: Secondary | ICD-10-CM

## 2015-11-20 DIAGNOSIS — M25641 Stiffness of right hand, not elsewhere classified: Secondary | ICD-10-CM

## 2015-11-20 NOTE — Patient Instructions (Signed)
Your Splint This splint should initially be fitted by a healthcare practitioner.  The healthcare practitioner is responsible for providing wearing instructions and precautions to the patient, other healthcare practitioners and care provider involved in the patient's care.  This splint was custom made for you. Please read the following instructions to learn about wearing and caring for your splint.  Precautions Should your splint cause any of the following problems, remove the splint immediately and contact your therapist/physician.  Swelling  Severe Pain  Pressure Areas  Stiffness  Numbness  Do not wear your splint while operating machinery unless it has been fabricated for that purpose.  When To Wear Your Splint Where your splint according to your therapist/physician instructions. Nights and rest periods only   BUILDING UP YOUR TOLERANCE: Day One ( Wednesday):  Wear 2 hours on and 2 hours off through out the day- DO NOT WEAR AT NIGHT YET Day Two (Thursday)  Wear 4 hours on 2 hours off 4 hours on- DO NOT WEAR AT NIGHT YET Day Three (Friday) Wear 5-6 hours on , 2 hours off then additional 3-4 hours. DO NOT WEAR AT NIGHT YET Day Four (Saturday) Wear at night ONLY do not wear during the day any more.   Care and Cleaning of Your Splint 1. Keep your splint away from open flames. 2. Your splint will lose its shape in temperatures over 135 degrees Farenheit, ( in car windows, near radiators, ovens or in hot water).  Never make any adjustments to your splint, if the splint needs adjusting remove it and make an appointment to see your therapist. 3. Your splint, including the cushion liner may be cleaned with soap and lukewarm water.  Do not immerse in hot water over 135 degrees Farenheit. 4. Straps may be washed with soap and water, but do not moisten the self-adhesive portion. 5. For ink or hard to remove spots use a scouring cleanser which contains chlorine.  Rinse the splint thoroughly  after using chlorine cleanser. 6.

## 2015-11-20 NOTE — Therapy (Signed)
[facility] [address] [place], [place], [address] Phone: [phone]   Fax:  [phone]  Occupational Therapy Treatment  Patient Details  Name: Julie Elliott MRN: [id number] Date of Birth: [birth year] Referring Provider: Dr, Alysia Penna  Encounter Date: 11/20/2015      OT End of Session - 11/20/15 1643    Visit Number 4   Number of Visits 16   Date for OT Re-Evaluation 01/03/16   Authorization Type MCR primary, MCD secondary, will need G code and PN every 10th visit   Authorization Time Period 60 days   Authorization - Visit Number 4   Authorization - Number of Visits 10   OT Start Time 1534   OT Stop Time 1620   OT Time Calculation (min) 46 min   Activity Tolerance Patient tolerated treatment well      Past Medical History  Diagnosis Date  . Hypertension     well controlled  . DJD (degenerative joint disease)   . HLD (hyperlipidemia) 2008    stable, well controlled  . DM (diabetes mellitus) ([place]) 2008    stable HgBA1C at 6.5  . GERD (gastroesophageal reflux disease)     well controlled on Omeprazole  . Chronic headaches   . CHF (congestive heart failure) [facility])     Past Surgical History  Procedure Laterality Date  . Tubal ligation    . Carpel tunnel Right     20 yrs ago  . Cervical spine surgery  2016    There were no vitals filed for this visit.      Subjective Assessment - 11/20/15 1623    Subjective  My left hand is getting even more loose   Patient Stated Goals I want my hands back   Currently in Pain? No/denies                      OT Treatments/Exercises (OP) - 11/20/15 0001    Splinting   Splinting fabricated resting hand splint to facilitate finger extension and thumb and wrist in functional position.  Also reviewed wear and care as well as how to build up wearing schedule to progress to night time wearing - see pt instructions.  Pt able to verbalize understanding.   Pt able to don and doff independently                OT Education - 11/20/15 1640    Education provided Yes   Education Details splint wear and care; build up schedule   Person(s) Educated Patient   Methods Explanation;Demonstration;Handout   Comprehension Verbalized understanding;Returned demonstration          OT Short Term Goals - 11/20/15 1642    OT SHORT TERM GOAL #1   Title Pt and son will be mod I with HEP - 12/06/2015   Baseline dependent   Status On-going   OT SHORT TERM GOAL #2   Title Pt will tolerate manual techniques from a pain standpoint to be able to address improving ROM in LUE to avoid further contracture and to reduce pain.   Baseline unable to tolerate   Status On-going   OT SHORT TERM GOAL #3   Title Pt will be able to use L hand as stabilizer for at least 2 functional tasks.   Baseline no functional use at this time   Status On-going           OT Long Term Goals - 11/20/15 1642  OT LONG TERM GOAL #1   Title Pt and son will be mod I with upgraded HEP - 01/03/2016   Baseline dependent   Status On-going   OT LONG TERM GOAL #2   Title Assess current splint and revise as needed   Baseline Pt did not have splint on eval however reports current splint is hard to don   Status On-going   OT LONG TERM GOAL #3   Title Pt will demonstrate increased PROM of shoulder flexion to 50* and shoulder abduction to 65* for ease in self care activities   Baseline shoulder flexion 40, abduction 60   Status On-going   OT LONG TERM GOAL #4   Title Pt will report increased relaxation in LUE and decreased intensity in spasm of LUE to reduce pain and discmfort   Baseline Reports severe currently   Status On-going               Plan - 11/20/15 1642    Clinical Impression Statement Pt progressing toward goals and demonsrating decresed tone in L hand and more islated movement.    Rehab Potential Fair   OT Frequency 2x / week   OT Duration 8 weeks   OT  Treatment/Interventions Self-care/ADL training;Electrical Stimulation;Moist Heat;Fluidtherapy;Ultrasound;Therapeutic exercise;Neuromuscular education;DME and/or AE instruction;Passive range of motion;Manual Therapy;Splinting;Therapeutic exercises;Therapeutic activities;Patient/family education   Plan check splint;  ulrasound to palm of L hand, manual therapyu, PROM, AAROM   Consulted and Agree with Plan of Care Patient      Patient will benefit from skilled therapeutic intervention in order to improve the following deficits and impairments:  Decreased range of motion, Decreased strength, Difficulty walking, Increased edema, Impaired UE functional use, Impaired tone, Increased muscle spasms, Pain  Visit Diagnosis: Stiffness of left shoulder, not elsewhere classified  Stiffness of right shoulder, not elsewhere classified  Stiffness of right elbow, not elsewhere classified  Stiffness of right hand, not elsewhere classified  Pain in left shoulder  Pain in joint of left hand  Muscle weakness (generalized)    Problem List Patient Active Problem List   Diagnosis Date Noted  . 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 Tobacco abuse 08/16/2015  . Peripheral neuropathy ([place]) 07/17/2015  . Cervical cord compression with myelopathy ([place]) 02/08/2015  . Frozen shoulder--left 09/05/2014  . Unintentional weight loss 06/20/2014  . Health care maintenance 03/23/2012  . Hypertension associated with diabetes ([place]) 08/17/2006  . GERD 08/17/2006  . History of diabetes mellitus, type II 08/11/2006  . Hyperlipidemia associated with type 2 diabetes mellitus ([place]) 08/11/2006  . Osteoarthritis 08/11/2006    [name], OTR/L 11/20/2015, 4:44 PM  [facility] [address] [place], [place], [address] Phone: [phone]   Fax:  [phone]  Name: Julie Elliott MRN: [id number] Date of Birth: 1956/03/24

## 2015-11-22 ENCOUNTER — Encounter: Payer: Self-pay | Admitting: Occupational Therapy

## 2015-11-22 ENCOUNTER — Ambulatory Visit: Payer: Medicare Other | Admitting: Occupational Therapy

## 2015-11-22 DIAGNOSIS — M25641 Stiffness of right hand, not elsewhere classified: Secondary | ICD-10-CM

## 2015-11-22 DIAGNOSIS — M25542 Pain in joints of left hand: Secondary | ICD-10-CM

## 2015-11-22 DIAGNOSIS — M25612 Stiffness of left shoulder, not elsewhere classified: Secondary | ICD-10-CM | POA: Diagnosis not present

## 2015-11-22 DIAGNOSIS — M6281 Muscle weakness (generalized): Secondary | ICD-10-CM

## 2015-11-22 DIAGNOSIS — M25621 Stiffness of right elbow, not elsewhere classified: Secondary | ICD-10-CM | POA: Diagnosis not present

## 2015-11-22 DIAGNOSIS — M25512 Pain in left shoulder: Secondary | ICD-10-CM | POA: Diagnosis not present

## 2015-11-22 DIAGNOSIS — M25611 Stiffness of right shoulder, not elsewhere classified: Secondary | ICD-10-CM

## 2015-11-22 DIAGNOSIS — M25642 Stiffness of left hand, not elsewhere classified: Secondary | ICD-10-CM

## 2015-11-22 NOTE — Therapy (Signed)
[facility] [address] [place], [place], [address] Phone: [phone]   Fax:  [phone]  Occupational Therapy Treatment  Patient Details  Name: Julie Elliott MRN: [id number] Date of Birth: 10-25-1955 Referring Provider: Dr, Alysia Penna  Encounter Date: 11/22/2015      OT End of Session - 11/22/15 1707    Visit Number 5   Number of Visits 16   Date for OT Re-Evaluation 01/03/16   Authorization Type MCR primary, MCD secondary, will need G code and PN every 10th visit   Authorization Time Period 60 days   Authorization - Visit Number 5   Authorization - Number of Visits 10   OT Start Time 1616   OT Stop Time [id number]   OT Time Calculation (min) 43 min   Activity Tolerance Patient tolerated treatment well      Past Medical History  Diagnosis Date  . Hypertension     well controlled  . DJD (degenerative joint disease)   . HLD (hyperlipidemia) 2008    stable, well controlled  . DM (diabetes mellitus) ([place]) 2008    stable HgBA1C at 6.5  . GERD (gastroesophageal reflux disease)     well controlled on Omeprazole  . Chronic headaches   . CHF (congestive heart failure) [facility])     Past Surgical History  Procedure Laterality Date  . Tubal ligation    . Carpel tunnel Right     20 yrs ago  . Cervical spine surgery  2016    There were no vitals filed for this visit.      Subjective Assessment - 11/22/15 1625    Subjective  The splint is going really good - I am not having any problems with it except sometimes it gets hot   Patient is accompained by: Family member  son   Patient Stated Goals I want my hands back   Currently in Pain? No/denies                      OT Treatments/Exercises (OP) - 11/22/15 0001    Neurological Re-education Exercises   Other Exercises 1 Neuro re ed focused on functional grasp and release in L hand for cylindrical and round objects as well as focus on increasing  active arch in hand to aid in 2 pt pinch with emphasis on moving thumb into more functional position (pt postures with thumb in adduction contributing to flattening of arch and leading to less functional use of hand. Pt is improving in finger extension as well as active useful finger flexion.     Ultrasound   Ultrasound Location L palm   Ultrasound Parameters 20%, 3.3 HmZ, .08w/cm, x8 minutes   Ultrasound Goals Other (Comment)  reduce tightness in soft tissue in hand to aid in AROM   Manual Therapy   Manual Therapy Joint mobilization;Soft tissue mobilization   Joint Mobilization joint and soft tissue mob to decrease tightnes and stiffness in L hand in order to improve functional AROM in L hand prior to use in activity. Pt tolerating more aggressive manual therapy.                    OT Short Term Goals - 11/22/15 1705    OT SHORT TERM GOAL #1   Title Pt and son will be mod I with HEP - 12/06/2015   Baseline dependent   Status On-going   OT SHORT TERM GOAL #2   Title Pt will  tolerate manual techniques from a pain standpoint to be able to address improving ROM in LUE to avoid further contracture and to reduce pain.   Baseline unable to tolerate   Status Achieved   OT SHORT TERM GOAL #3   Title Pt will be able to use L hand as stabilizer for at least 2 functional tasks.   Baseline no functional use at this time   Status On-going           OT Long Term Goals - 11/22/15 1706    OT LONG TERM GOAL #1   Title Pt and son will be mod I with upgraded HEP - 01/03/2016   Baseline dependent   Status On-going   OT LONG TERM GOAL #2   Title Assess current splint and revise as needed   Baseline Pt did not have splint on eval however reports current splint is hard to don   Status Achieved   OT LONG TERM GOAL #3   Title Pt will demonstrate increased PROM of shoulder flexion to 50* and shoulder abduction to 65* for ease in self care activities   Baseline shoulder flexion 40, abduction 60    Status On-going   OT LONG TERM GOAL #4   Title Pt will report increased relaxation in LUE and decreased intensity in spasm of LUE to reduce pain and discmfort   Baseline Reports severe currently   Status On-going               Plan - 11/22/15 1706    Clinical Impression Statement Pt making good progress toward goals. Pt with more relaxation in L hand and improved grasp and release.    Rehab Potential Fair   OT Frequency 2x / week   OT Duration 8 weeks   OT Treatment/Interventions Self-care/ADL training;Electrical Stimulation;Moist Heat;Fluidtherapy;Ultrasound;Therapeutic exercise;Neuromuscular education;DME and/or AE instruction;Passive range of motion;Manual Therapy;Splinting;Therapeutic exercises;Therapeutic activities;Patient/family education   Plan check splint wear, ultrasound to L palm, manual therapy, functional use of  L hand, begin to address shoulder tightness   Consulted and Agree with Plan of Care Patient   Family Member Consulted son      Patient will benefit from skilled therapeutic intervention in order to improve the following deficits and impairments:  Decreased range of motion, Decreased strength, Difficulty walking, Increased edema, Impaired UE functional use, Impaired tone, Increased muscle spasms, Pain  Visit Diagnosis: Pain in joint of left hand  Stiffness of left hand joint  Stiffness of left shoulder, not elsewhere classified  Stiffness of right shoulder, not elsewhere classified  Stiffness of right elbow, not elsewhere classified  Stiffness of right hand, not elsewhere classified  Pain in left shoulder  Muscle weakness (generalized)    Problem List Patient Active Problem List   Diagnosis Date Noted  . 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 Tobacco abuse 08/16/2015  . Peripheral neuropathy ([place]) 07/17/2015  . Cervical cord compression with myelopathy ([place]) 02/08/2015  . Frozen shoulder--left 09/05/2014  . Unintentional weight loss  06/20/2014  . Health care maintenance 03/23/2012  . Hypertension associated with diabetes ([place]) 08/17/2006  . GERD 08/17/2006  . History of diabetes mellitus, type II 08/11/2006  . Hyperlipidemia associated with type 2 diabetes mellitus ([place]) 08/11/2006  . Osteoarthritis 08/11/2006    [name], OTR/L 11/22/2015, 5:10 PM  [facility] [address] [place], [place], [address] Phone: [phone]   Fax:  [phone]  Name: Julie Elliott MRN: [id number] Date of Birth: 1955-10-01

## 2015-11-23 ENCOUNTER — Inpatient Hospital Stay: Admit: 2015-11-23 | Primary: Registered Nurse

## 2015-11-23 LAB — SENTARA SPECIMEN COLLN.

## 2015-11-24 DIAGNOSIS — D62 Acute posthemorrhagic anemia: Secondary | ICD-10-CM

## 2015-11-24 NOTE — ED Notes (Addendum)
Pt states she feels fine was sent by her doctor due to abnormal lab results, "low blood". Pt denies pain, SOB, states she has been tired lately and has had darker than normal stools for a couple of days but unsure if it looked like blood, no obvious signs of bleeding according to pt. Physical exam is unremarkable. Pt has hx of CVA 3 years ago.

## 2015-11-24 NOTE — ED Notes (Signed)
Instructed on collection of a clean catch urine specimen verbally and with wipes and urine collection container given, verbalized understanding.

## 2015-11-24 NOTE — ED Provider Notes (Signed)
HPI Comments: 11:29 PM [name] Short is a 60 y.o. female who presents to the ED for evaluation of abnormal lab results. Patient states that she was at her PCP's office for a routine check up, and at that time her blood count was noted to have dropped from 12 to 6 over the course of 1 year. She was advised to come to the ED for further evaluation. Currently, the patient is only c/o fatigue and chronic arthralgias. She mentions a nose bleed yesterday that lasted for 1 hour. Denies blood in stools, and any other pertinent symptoms. Reports a normal colonoscopy approximately 7 years ago, at age 15. No further complaints at this time.     The history is provided by the patient.        Past Medical History:   Diagnosis Date   ??? Anemia    ??? Diabetes (HCC)        Past Surgical History:   Procedure Laterality Date   ??? HX CESAREAN SECTION  1987   ??? HX DILATION AND CURETTAGE  1995         Family History:   Problem Relation Age of Onset   ??? Hypertension Other 35     parent,NOS   ??? Heart Disease Other 62     parent, NOS   ??? Diabetes Other 45     parent, NOS   ??? Cancer Father        Social History     Social History   ??? Marital status: SINGLE     Spouse name: N/A   ??? Number of children: N/A   ??? Years of education: N/A     Occupational History   ??? Not on file.     Social History Main Topics   ??? Smoking status: Current Every Day Smoker   ??? Smokeless tobacco: Not on file      Comment: patient state that she smokes 1 black&mild /daily   ??? Alcohol use No   ??? Drug use: No   ??? Sexual activity: No     Other Topics Concern   ??? Not on file     Social History Narrative         ALLERGIES: Review of patient's allergies indicates no known allergies.    Review of Systems   Constitutional: Positive for fatigue. Negative for activity change and appetite change.   HENT: Positive for nosebleeds. Negative for congestion, ear discharge, ear pain, facial swelling, postnasal drip, sinus pressure, sneezing and tinnitus.     Eyes: Negative for pain, discharge, redness and visual disturbance.   Respiratory: Negative for apnea, cough, choking, chest tightness, shortness of breath, wheezing and stridor.    Cardiovascular: Negative for chest pain and leg swelling.   Gastrointestinal: Negative for abdominal distention, abdominal pain, anal bleeding, blood in stool, constipation, diarrhea, nausea and vomiting.   Genitourinary: Negative for decreased urine volume, difficulty urinating, dyspareunia, dysuria, flank pain, frequency, hematuria, pelvic pain, urgency, vaginal bleeding and vaginal discharge.   Musculoskeletal: Positive for arthralgias. Negative for back pain, gait problem, joint swelling, myalgias, neck pain and neck stiffness.   Skin: Negative for color change.   Neurological: Negative for dizziness, tremors, seizures, speech difficulty, weakness, numbness and headaches.   Hematological: Negative for adenopathy. Does not bruise/bleed easily.   Psychiatric/Behavioral: Negative for agitation, dysphoric mood and self-injury. The patient is not nervous/anxious.        Vitals:    11/24/15 2220 11/24/15 2330   BP: 136/70 135/57  Pulse: 85 80   Resp: 19 17   Temp: 97.7 ??F (36.5 ??C)    SpO2: 100% 100%   Weight: 75.4 kg (166 lb 4.8 oz)    Height: 4\' 11"  (1.499 m)             Physical Exam   Constitutional: She is oriented to person, place, and time. She appears well-developed and well-nourished.   HENT:   Head: Normocephalic and atraumatic.   Right Ear: External ear normal.   Left Ear: External ear normal.   Nose: Nose normal.   Mouth/Throat: Oropharynx is clear and moist.   Eyes: Conjunctivae and EOM are normal. Pupils are equal, round, and reactive to light.   Neck: Normal range of motion. Neck supple.   Cardiovascular: Regular rhythm, normal heart sounds and intact distal pulses.    Pulmonary/Chest: Effort normal and breath sounds normal. No respiratory distress. She has no wheezes. She has no rales. She exhibits no tenderness.    Abdominal: Soft. Bowel sounds are normal. She exhibits no distension and no mass. There is no tenderness. There is no rebound and no guarding.   Genitourinary:   Genitourinary Comments: Rectal Exam:   Brown stool, guaiac positive.    Musculoskeletal: Normal range of motion. She exhibits no edema.   Neurological: She is alert and oriented to person, place, and time.   Skin: Skin is warm and dry. No rash noted. No erythema.   Psychiatric: She has a normal mood and affect. Her behavior is normal. Judgment normal.   Nursing note and vitals reviewed.       MDM  Number of Diagnoses or Management Options  Guaiac positive stools:   Iron deficiency anemia due to chronic blood loss:   Diagnosis management comments: 60 year old female sent in after a workup for outpatient abdominal pain showed her to be significantly anemic.  DDx includes occult GI loss, ongoing intermittent menses loss, other      ED Course       Procedures    Vitals:  Patient Vitals for the past 12 hrs:   Temp Pulse Resp BP SpO2   11/24/15 2330 - 80 17 135/57 100 %   11/24/15 2220 97.7 ??F (36.5 ??C) 85 19 136/70 100 %     100% on RA, indicating adequate oxygenation.    Medications ordered:   Medications   0.9% sodium chloride infusion 250 mL (not administered)         Lab findings:  Recent Results (from the past 12 hour(s))   CBC WITH AUTOMATED DIFF    Collection Time: 11/24/15 10:57 PM   Result Value Ref Range    WBC 8.3 4.6 - 13.2 K/uL    RBC 3.51 (L) 4.20 - 5.30 M/uL    HGB 6.4 (L) 12.0 - 16.0 g/dL    HCT 16.1 (L) 09.6 - 45.0 %    MCV 65.5 (L) 74.0 - 97.0 FL    MCH 18.2 (L) 24.0 - 34.0 PG    MCHC 27.8 (L) 31.0 - 37.0 g/dL    RDW 04.5 (H) 40.9 - 14.5 %    PLATELET 283 135 - 420 K/uL    MPV 9.5 9.2 - 11.8 FL    NEUTROPHILS 73 40 - 73 %    LYMPHOCYTES 16 (L) 21 - 52 %    MONOCYTES 9 3 - 10 %    EOSINOPHILS 2 0 - 5 %    BASOPHILS 0 0 - 2 %  ABS. NEUTROPHILS 6.1 1.8 - 8.0 K/UL    ABS. LYMPHOCYTES 1.3 0.9 - 3.6 K/UL     ABS. MONOCYTES 0.7 0.05 - 1.2 K/UL    ABS. EOSINOPHILS 0.2 0.0 - 0.4 K/UL    ABS. BASOPHILS 0.0 0.0 - 0.1 K/UL    DF AUTOMATED      PLATELET COMMENTS ADEQUATE PLATELETS      RBC COMMENTS ANISOCYTOSIS  2+        RBC COMMENTS MICROCYTOSIS  2+        RBC COMMENTS POLYCHROMASIA  1+        RBC COMMENTS HYPOCHROMIA  3+        RBC COMMENTS OVALOCYTES  1+       METABOLIC PANEL, BASIC    Collection Time: 11/24/15 10:57 PM   Result Value Ref Range    Sodium 142 136 - 145 mmol/L    Potassium 3.8 3.5 - 5.5 mmol/L    Chloride 107 100 - 108 mmol/L    CO2 29 21 - 32 mmol/L    Anion gap 6 3.0 - 18 mmol/L    Glucose 72 (L) 74 - 99 mg/dL    BUN 14 7.0 - 18 MG/DL    Creatinine 4.540.59 (L) 0.6 - 1.3 MG/DL    BUN/Creatinine ratio 24 (H) 12 - 20      GFR est AA >60 >60 ml/min/1.473m2    GFR est non-AA >60 >60 ml/min/1.8773m2    Calcium 9.2 8.5 - 10.1 MG/DL   FERRITIN    Collection Time: 11/24/15 10:57 PM   Result Value Ref Range    Ferritin 9 8 - 388 NG/ML   IRON PROFILE    Collection Time: 11/24/15 10:57 PM   Result Value Ref Range    Iron 60 50 - 175 ug/dL    TIBC 098486 (H) 119250 - 147450 ug/dL    Iron % saturation 12 %   RETICULOCYTE COUNT    Collection Time: 11/24/15 10:57 PM   Result Value Ref Range    Reticulocyte count 1.3 0.5 - 2.3 %   VITAMIN B12 & FOLATE    Collection Time: 11/24/15 10:57 PM   Result Value Ref Range    Vitamin B12 562 211 - 911 pg/mL    Folate >20.0 (H) 3.10 - 17.50 ng/mL   TYPE & CROSSMATCH    Collection Time: 11/24/15 11:45 PM   Result Value Ref Range    Crossmatch Expiration 11/27/2015     ABO/Rh(D) B POSITIVE     Antibody screen NEG     CALLED TO:       RACHEL LEWIS,RN  EMERRM AT 0034 ON 11/25/2015 BY GNS    Unit number [id number]     Blood component type RC LR AS1     Unit division 00     Status of unit ALLOCATED     Crossmatch result Compatible     Unit number [id number]     Blood component type RC LR AS1     Unit division 00     Status of unit ALLOCATED     Crossmatch result Compatible       Progress notes, Consult notes or additional Procedure notes:   Consult:  Discussed care with Dr. Virl Cageyjo, Hospitalist. Standard discussion; including history of patient???s chief complaint, available diagnostic results, and treatment course. Being that the patient has a hemoglobin of less than 7, and is taking Plavix and ASA for a previous CVA she is at higher risk for increased  bleeding. Will admit for a blood transfusion.   12:47 AM    Disposition:  Diagnosis:   1. Iron deficiency anemia due to chronic blood loss    2. Guaiac positive stools        Disposition: Admit     Follow-up Information     None           Patient's Medications   Start Taking    No medications on file   Continue Taking    ASPIRIN 81 MG CHEWABLE TABLET    Take 1 tablet by mouth daily.    ATORVASTATIN (LIPITOR) 40 MG TABLET    Take 1 tablet by mouth nightly.    CHOLECALCIFEROL (VITAMIN D3) 1,000 UNIT TABLET    Take 1 tablet by mouth daily.    CLOPIDOGREL (PLAVIX) 75 MG TABLET    Take 1 tablet by mouth daily.    EXENATIDE MICROSPHERES (BYDUREON SC)    by SubCUTAneous route.    FUROSEMIDE (LASIX) 20 MG TABLET    Take  by mouth daily.    INSULIN ASPART PROTAMINE/INSULIN ASPART (NOVOLOG MIX 70-30 FLEXPEN) 100 UNIT/ML (70-30) FLEX PEN    30 units SQ every morning and 25 units SQ every evening    LISINOPRIL (PRINIVIL, ZESTRIL) 5 MG TABLET    Take 1 tablet by mouth daily.    OTHER    Check CBC, CMP, Mg in 5 days. Results to PCP    Dx-CVA    OTHER    This is to certify that  Abiageal Blowe  was admitted to [facility] on 04/23/2013 and discharged on 04/25/2013 , and has been advised to take rest at home for 14 ( Fourteen ) more days and then resume work if symptom free.   These Medications have changed    No medications on file   Stop Taking    No medications on file           SCRIBE ATTESTATION STATEMENT  Documented by: Maretta Bees scribing for, and in the presence of, Thomes Dinning, MD 11:33 PM      Signed by: Maretta Bees, Scribe, 11/25/2015, 11:33 PM    PROVIDER ATTESTATION STATEMENT  I personally performed the services described in the documentation, reviewed the documentation, as recorded by the scribe in my presence, and it accurately and completely records my words and actions.  Thomes Dinning, MD

## 2015-11-24 NOTE — ED Triage Notes (Signed)
Patient states that she had lab work done her yesterday and her Doctor call yesterday and go to ED.  Patient was 14 last year and a 6 Hgb

## 2015-11-25 ENCOUNTER — Inpatient Hospital Stay
Admit: 2015-11-25 | Discharge: 2015-11-25 | Disposition: A | Discharge status: Home / Self Care | Payer: PRIVATE HEALTH INSURANCE | Attending: Emergency Medicine

## 2015-11-25 LAB — IRON PROFILE
Iron % saturation: 12 %
Iron % saturation: 14 %
Iron: 60 ug/dL (ref 50–175)
Iron: 57 ug/dL (ref 50–175)
TIBC: 486 ug/dL — ABNORMAL HIGH (ref 250–450)
TIBC: 421 ug/dL (ref 250–450)

## 2015-11-25 LAB — METABOLIC PANEL, BASIC
Anion gap: 6 mmol/L (ref 3.0–18)
Anion gap: 4 mmol/L (ref 3.0–18)
BUN/Creatinine ratio: 24 — ABNORMAL HIGH (ref 12–20)
BUN/Creatinine ratio: 22 — ABNORMAL HIGH (ref 12–20)
BUN: 14 MG/DL (ref 7.0–18)
BUN: 11 MG/DL (ref 7.0–18)
CO2: 29 mmol/L (ref 21–32)
CO2: 27 mmol/L (ref 21–32)
Calcium: 9.2 MG/DL (ref 8.5–10.1)
Calcium: 8.8 MG/DL (ref 8.5–10.1)
Chloride: 107 mmol/L (ref 100–108)
Chloride: 111 mmol/L — ABNORMAL HIGH (ref 100–108)
Creatinine: 0.59 MG/DL — ABNORMAL LOW (ref 0.6–1.3)
Creatinine: 0.49 MG/DL — ABNORMAL LOW (ref 0.6–1.3)
GFR est AA: >60 mL/min/{1.73_m2} (ref >60)
GFR est AA: >60 mL/min/{1.73_m2} (ref >60)
GFR est non-AA: >60 mL/min/{1.73_m2} (ref >60)
GFR est non-AA: >60 mL/min/{1.73_m2} (ref >60)
Glucose: 72 mg/dL — ABNORMAL LOW (ref 74–99)
Glucose: 105 mg/dL — ABNORMAL HIGH (ref 74–99)
Potassium: 3.8 mmol/L (ref 3.5–5.5)
Potassium: 4.1 mmol/L (ref 3.5–5.5)
Sodium: 142 mmol/L (ref 136–145)
Sodium: 142 mmol/L (ref 136–145)

## 2015-11-25 LAB — CBC W/O DIFF
HCT: 27 % — ABNORMAL LOW (ref 35.0–45.0)
HGB: 8.1 g/dL — ABNORMAL LOW (ref 12.0–16.0)
MCH: 20.9 PG — ABNORMAL LOW (ref 24.0–34.0)
MCHC: 30 g/dL — ABNORMAL LOW (ref 31.0–37.0)
MCV: 69.6 FL — ABNORMAL LOW (ref 74.0–97.0)
MPV: 8.9 FL — ABNORMAL LOW (ref 9.2–11.8)
PLATELET: 186 10*3/uL (ref 135–420)
RBC: 3.88 M/uL — ABNORMAL LOW (ref 4.20–5.30)
RDW: 22.3 % — ABNORMAL HIGH (ref 11.6–14.5)
WBC: 6.1 10*3/uL (ref 4.6–13.2)

## 2015-11-25 LAB — RETICULOCYTE COUNT: Reticulocyte count: 1.3 % (ref 0.5–2.3)

## 2015-11-25 LAB — CBC WITH AUTOMATED DIFF
ABS. BASOPHILS: 0 10*3/uL (ref 0.0–0.1)
ABS. EOSINOPHILS: 0.2 10*3/uL (ref 0.0–0.4)
ABS. LYMPHOCYTES: 1.3 10*3/uL (ref 0.9–3.6)
ABS. MONOCYTES: 0.7 10*3/uL (ref 0.05–1.2)
ABS. NEUTROPHILS: 6.1 10*3/uL (ref 1.8–8.0)
BASOPHILS: 0 % (ref 0–2)
EOSINOPHILS: 2 % (ref 0–5)
HCT: 23 % — ABNORMAL LOW (ref 35.0–45.0)
HGB: 6.4 g/dL — ABNORMAL LOW (ref 12.0–16.0)
LYMPHOCYTES: 16 % — ABNORMAL LOW (ref 21–52)
MCH: 18.2 PG — ABNORMAL LOW (ref 24.0–34.0)
MCHC: 27.8 g/dL — ABNORMAL LOW (ref 31.0–37.0)
MCV: 65.5 FL — ABNORMAL LOW (ref 74.0–97.0)
MONOCYTES: 9 % (ref 3–10)
MPV: 9.5 FL (ref 9.2–11.8)
NEUTROPHILS: 73 % (ref 40–73)
PLATELET COMMENTS: ADEQUATE
PLATELET: 283 10*3/uL (ref 135–420)
RBC: 3.51 M/uL — ABNORMAL LOW (ref 4.20–5.30)
RDW: 21.3 % — ABNORMAL HIGH (ref 11.6–14.5)
WBC: 8.3 10*3/uL (ref 4.6–13.2)

## 2015-11-25 LAB — HEMOGLOBIN A1C WITH EAG
Est. average glucose: 128 mg/dL
Hemoglobin A1c: 6.1 % — ABNORMAL HIGH (ref 4.2–5.6)

## 2015-11-25 LAB — FERRITIN: Ferritin: 9 NG/ML (ref 8–388)

## 2015-11-25 LAB — GLUCOSE, POC
Glucose (POC): 108 mg/dL (ref 70–110)
Glucose (POC): 111 mg/dL — ABNORMAL HIGH (ref 70–110)
Glucose (POC): 141 mg/dL — ABNORMAL HIGH (ref 70–110)

## 2015-11-25 LAB — VITAMIN B12 & FOLATE
Folate: >20 ng/mL — ABNORMAL HIGH (ref 3.10–17.50)
Vitamin B12: 562 pg/mL (ref 211–911)

## 2015-11-25 MED ORDER — SODIUM CHLORIDE 0.45 % IV
0.45 % | INTRAVENOUS | Status: DC
Start: 2015-11-25 — End: 2015-11-27
  Administered 2015-11-25: 10:00:00 via INTRAVENOUS

## 2015-11-25 MED ORDER — ATORVASTATIN 40 MG TAB
40 mg | Freq: Every evening | ORAL | Status: DC
Start: 2015-11-25 — End: 2015-11-27
  Administered 2015-11-26 – 2015-11-27 (×2): via ORAL

## 2015-11-25 MED ORDER — GLUCAGON 1 MG INJECTION
1 mg | INTRAMUSCULAR | Status: DC | PRN
Start: 2015-11-25 — End: 2015-11-27

## 2015-11-25 MED ORDER — GLUCOSE 4 GRAM CHEWABLE TAB
4 gram | ORAL | Status: DC | PRN
Start: 2015-11-25 — End: 2015-11-27

## 2015-11-25 MED ORDER — INSULIN LISPRO 100 UNIT/ML INJECTION
100 unit/mL | Freq: Four times a day (QID) | SUBCUTANEOUS | Status: DC
Start: 2015-11-25 — End: 2015-11-25

## 2015-11-25 MED ORDER — INSULIN LISPRO 100 UNIT/ML INJECTION
100 unit/mL | Freq: Four times a day (QID) | SUBCUTANEOUS | Status: DC
Start: 2015-11-25 — End: 2015-11-27
  Administered 2015-11-26 – 2015-11-27 (×2): via SUBCUTANEOUS

## 2015-11-25 MED ORDER — LISINOPRIL 5 MG TAB
5 mg | Freq: Every day | ORAL | Status: DC
Start: 2015-11-25 — End: 2015-11-27
  Administered 2015-11-26: 20:00:00 via ORAL

## 2015-11-25 MED ORDER — INSULIN LISPRO 100 UNIT/ML INJECTION
100 unit/mL | Freq: Three times a day (TID) | SUBCUTANEOUS | Status: DC
Start: 2015-11-25 — End: 2015-11-27
  Administered 2015-11-25 – 2015-11-27 (×5): via SUBCUTANEOUS

## 2015-11-25 MED ORDER — NICOTINE 14 MG/24 HR DAILY PATCH
14 mg/24 hr | TRANSDERMAL | Status: DC
Start: 2015-11-25 — End: 2015-11-27

## 2015-11-25 MED ORDER — SODIUM CHLORIDE 0.9 % IV
INTRAVENOUS | Status: DC | PRN
Start: 2015-11-25 — End: 2015-11-27

## 2015-11-25 MED ORDER — GLUCAGON 1 MG INJECTION
1 mg | INTRAMUSCULAR | Status: DC | PRN
Start: 2015-11-25 — End: 2015-11-25

## 2015-11-25 MED ORDER — .PHARMACY TO SUBSTITUTE PER PROTOCOL
Status: DC | PRN
Start: 2015-11-25 — End: 2015-11-25

## 2015-11-25 MED ORDER — FUROSEMIDE 20 MG TAB
20 mg | Freq: Every day | ORAL | Status: DC
Start: 2015-11-25 — End: 2015-11-25

## 2015-11-25 MED ORDER — GLUCOSE 4 GRAM CHEWABLE TAB
4 gram | ORAL | Status: DC | PRN
Start: 2015-11-25 — End: 2015-11-25

## 2015-11-25 MED ORDER — INSULIN GLARGINE 100 UNIT/ML INJECTION
100 unit/mL | Freq: Every evening | SUBCUTANEOUS | Status: DC
Start: 2015-11-25 — End: 2015-11-27
  Administered 2015-11-26 – 2015-11-27 (×2): via SUBCUTANEOUS

## 2015-11-25 MED ORDER — CHOLECALCIFEROL (VITAMIN D3) 1,000 UNIT (25 MCG) TAB
Freq: Every day | ORAL | Status: DC
Start: 2015-11-25 — End: 2015-11-27
  Administered 2015-11-25 – 2015-11-27 (×3): via ORAL

## 2015-11-25 MED ORDER — DEXTROSE 50% IN WATER (D50W) IV SYRG
INTRAVENOUS | Status: DC | PRN
Start: 2015-11-25 — End: 2015-11-27

## 2015-11-25 MED FILL — SODIUM CHLORIDE 0.45 % IV: 0.45 % | INTRAVENOUS | Qty: 1000

## 2015-11-25 MED FILL — SODIUM CHLORIDE 0.9 % IV: INTRAVENOUS | Qty: 250

## 2015-11-25 MED FILL — FUROSEMIDE 20 MG TAB: 20 mg | ORAL | Qty: 1

## 2015-11-25 MED FILL — INSULIN LISPRO 100 UNIT/ML INJECTION: 100 unit/mL | SUBCUTANEOUS | Qty: 1

## 2015-11-25 MED FILL — LISINOPRIL 5 MG TAB: 5 mg | ORAL | Qty: 1

## 2015-11-25 MED FILL — CHOLECALCIFEROL (VITAMIN D3) 1,000 UNIT (25 MCG) TAB: ORAL | Qty: 1

## 2015-11-25 MED FILL — ATORVASTATIN 40 MG TAB: 40 mg | ORAL | Qty: 1

## 2015-11-25 MED FILL — NICOTINE 14 MG/24 HR DAILY PATCH: 14 mg/24 hr | TRANSDERMAL | Qty: 1

## 2015-11-25 NOTE — ED Notes (Signed)
Hourly rounding-Pt resting on stretcher, side rails up, call bell in reach, vitals stable, pt updated on plan of care, no issues or complaints at this time.

## 2015-11-25 NOTE — Other (Signed)
Bedside and Verbal shift change report given to Wilnette Kaleshelma, Charity fundraiserN (Cabin crewoncoming nurse) by Iantha FallenKenneth, RN (offgoing nurse). Report included the following information SBAR, Kardex, ED Summary, Procedure Summary, Intake/Output, MAR, Recent Results and Med Rec Status.

## 2015-11-25 NOTE — Other (Signed)
Dr. Sherlynn Stallsalur made awre of BP this AM and lisinopril held and bloodsugars 108 and 111 at noon insulin held

## 2015-11-25 NOTE — ED Notes (Signed)
Dr. Bertram GalaAnglin at bedside to perform stool guiac test, pt test mildly positive

## 2015-11-25 NOTE — H&P (Signed)
[facility]     Admission History and Physical    Patient: [name] Short               Sex: female          DOA: 11/24/2015       Date of Birth:  1956/05/07      Age:  60 y.o.        LOS:  LOS: 0 days      Assessment and Plan:    Acute blood loss Anemia () hemoglobin of 6.4. Probably GI loss vs Post menopausal bleed    Admit and monitor in telemetry unit   Type and cross, transfuse with 2 units of packed cell   Stool quaiac   GI consult in am   Iron studies   Hold Aspirin and Plavix   Pelvic US, based on the result might need a GYN consult with history of post menopausal bleeding     Diabetes Mellitus Type II   Check hemoglobin a1c   Continue SSI     History of CVA   PT evaluation   Aspirin and Plavix on hold.    Hypertension controlled   Continue Lisinopril and Lasix   Hydralazine PRN     Tobacco abuse   Patient counseled   Nicotine patch offered     Risk of deterioration: high      Total time spent with patient: 7770 Minutes                  Care Plan discussed with: Patient, Family, Nursing Staff, Consultant/Specialist and >50% of time spent in counseling and coordination of care    Discussed:  Code Status, Care Plan and D/C Planning    Prophylaxis:  SCD's    Disposition:  Home w/Family    HPI:     CHIEF COMPLAINT:   Abdominal pain   Positive stool guaiac     HISTORY OF PRESENT ILLNESS:     [name] is a 60 y.o.  African American female with history of Diabetes Mellitus, hypertension, anemia and acute CVA on plavix and aspirin who presents to the ED for evaluation of abdominal pain and evaluation of hemoglobin of 6.4 she was seen at Dr Andrena Mewsajao her PCP's office for a routine check up and complain of dyspnea on exertion with fatigue. she reports a colonoscopy approximately 7 years ago with some polyps removed. Last hemoglobin 1 year ago was 10. She denies any gross bleeding. She also complains of still having heavy period while been post menopausal. Last  Menstrual period was about 2 months ago and were heavy. She said she was told she has ovarian cyst.   Denies any chest pain no nausea no vomiting no diarrhea.     Past Medical History:   Diagnosis Date   ??? Anemia    ??? Diabetes (HCC)         Past Surgical History:   Procedure Laterality Date   ??? HX CESAREAN SECTION  1987   ??? HX DILATION AND CURETTAGE  1995       Social History   Substance Use Topics   ??? Smoking status: Current Every Day Smoker   ??? Smokeless tobacco: Not on file      Comment: patient state that she smokes 1 black&mild /daily   ??? Alcohol use No        Family History   Problem Relation Age of Onset   ??? Hypertension Other 35  parent,NOS   ??? Heart Disease Other 62     parent, NOS   ??? Diabetes Other 45     parent, NOS   ??? Cancer Father         No Known Allergies     Prior to Admission medications    Medication Sig Start Date End Date Taking? Authorizing Provider   furosemide (LASIX) 20 mg tablet Take  by mouth daily.    Historical Provider   EXENATIDE MICROSPHERES (BYDUREON SC) by SubCUTAneous route.    Historical Provider   cholecalciferol (VITAMIN D3) 1,000 unit tablet Take 1 tablet by mouth daily. 04/25/13   Jonah Blue, MD   insulin aspart protamine/insulin aspart (NOVOLOG MIX 70-30 FLEXPEN) 100 unit/mL (70-30) flex pen 30 units SQ every morning and 25 units SQ every evening 04/25/13   Jonah Blue, MD   aspirin 81 mg chewable tablet Take 1 tablet by mouth daily. 04/26/13   Jonah Blue, MD   atorvastatin (LIPITOR) 40 mg tablet Take 1 tablet by mouth nightly. 04/25/13   Jonah Blue, MD   clopidogrel (PLAVIX) 75 mg tablet Take 1 tablet by mouth daily. 04/25/13   Jonah Blue, MD   lisinopril (PRINIVIL, ZESTRIL) 5 mg tablet Take 1 tablet by mouth daily. 04/26/13   Jonah Blue, MD   OTHER Check CBC, CMP, Mg in 5 days. Results to PCP    Dx-CVA 04/25/13   Jonah Blue, MD   OTHER This is to certify that  [name]  was admitted to [facility] on 04/23/2013 and discharged on 04/25/2013 , and has been advised to take rest at home for 14 ( Fourteen ) more days and then resume work if symptom free. 04/25/13   Jonah Blue, MD       REVIEW OF SYSTEMS:     General: negative for fever, chills, sweats, weakness  Eyes: negative for blurred vision, eye pain, loss of vision, diplopia  Ear Nose and Throat: negative for rhinorrhea, pharyngitis, otalgia, tinnitus, speech or swallowing difficulties  Respiratory:  positive for DOE   Cardiology:  negative for chest pain, palpitations, orthopnea, PND, syncope  and positive for edema  Gastrointestinal: negative for abdominal pain, N/V, dysphagia, change in bowel habits, bleeding  Genitourinary: negative for frequency, urgency, dysuria, hematuria, incontinence  Muskuloskeletal : negative for arthralgia, myalgia  Hematology: negative for easy bruising, bleeding, lymphadenopathy  Dermatological: negative for rash, ulceration, mole change, new lesion  Endocrine: negative for hot flashes or polydipsia  Neurological: negative for headache, dizziness, confusion, focal weakness, paresthesia, memory loss, gait disturbance  Psychological: negative for anxiety, depression, agitation      Physical Exam:     VITALS:    Vital signs reviewed; most recent are:    Visit Vitals   ??? BP 135/57   ??? Pulse 80   ??? Temp 97.7 ??F (36.5 ??C)   ??? Resp 17   ??? Ht  (1.499 m)   ??? Wt 75.4 kg (166 lb 4.8 oz)   ??? SpO2 100%   ??? BMI 33.59 kg/m2     SpO2 Readings from Last 6 Encounters:   11/24/15 100%   05/23/13 98%   04/25/13 100%        No intake or output data in the 24 hours ending 11/25/15 0054     Physical Exam:   General:  Alert, cooperative, no distress, appears stated age.   Eyes:  Conjunctivae/corneas clear. PERRL, EOMs intact. Fundi benign   Ears:  Normal TMs  and external ear canals both ears.   Nose: Nares normal. Septum midline. Mucosa normal. No drainage or sinus tenderness.    Mouth/Throat: Lips, mucosa, and tongue normal. Teeth and gums normal.   Neck: Supple, symmetrical, trachea midline, no adenopathy, thyroid: no enlargement/tenderness/nodules, no carotid bruit and no JVD.   Back:   Symmetric, no curvature. ROM normal. No CVA tenderness.   Lungs:   Clear to auscultation bilaterally.   Heart:  Regular rate and rhythm, S1, S2 normal, no murmur, click, rub or gallop.   Abdomen:   Soft, non-tender. Bowel sounds normal. No masses,  No organomegaly.   Extremities: Extremities normal, atraumatic, no cyanosis + edema.   Pulses: 2+ and symmetric all extremities.   Skin: Skin color, texture, turgor normal. No rashes or lesions       Neurologic: CNII-XII intact. Normal strength, sensation and reflexes throughout.       Labs:  BMP:   Lab Results   Component Value Date/Time    NA 142 11/24/2015 10:57 PM    K 3.8 11/24/2015 10:57 PM    CL 107 11/24/2015 10:57 PM    CO2 29 11/24/2015 10:57 PM    AGAP 6 11/24/2015 10:57 PM    GLU 72 (L) 11/24/2015 10:57 PM    BUN 14 11/24/2015 10:57 PM    CREA 0.59 (L) 11/24/2015 10:57 PM    GFRAA >60 11/24/2015 10:57 PM    GFRNA >60 11/24/2015 10:57 PM     CBC:   Lab Results   Component Value Date/Time    WBC 8.3 11/24/2015 10:57 PM    HGB 6.4 (L) 11/24/2015 10:57 PM    HCT 23.0 (L) 11/24/2015 10:57 PM    PLT 283 11/24/2015 10:57 PM     All Cardiac Markers in the last 24 hours: No results found for: CPK, CKMMB, CKMB, RCK3, CKMBT, CKNDX, CKND1, MYO, TROPT, TROIQ, TROI, TROPT, TNIPOC, BNP, BNPP  Recent Glucose Results:   Lab Results   Component Value Date/Time    GLU 72 (L) 11/24/2015 10:57 PM     ABG: No results found for: PH, PHI, PCO2, PCO2I, PO2, PO2I, HCO3, HCO3I, FIO2, FIO2I  COAGS: No results found for: APTT, PTP, INR  Liver Panel: No results found for: ALB, CBIL, TBIL, TP, GLOB, AGRAT, SGOT, ASTPOC, ALTPOC, ALT, GPT, AP  Pancreatic Markers: No results found for: AMYLPOCT, AML, LIPPOCT, LPSE    Telemetry reviewed:   normal sinus rhythm  Tubes:   None    X-Ray:  Reviewed   Procedures:  None              ___________________________________________________    Attending Physician:     Eartha Inch, MD

## 2015-11-25 NOTE — Progress Notes (Signed)
Pt feels ok, no new complaints   Labs, chart and vitals noted   Will monitor h/h and transfuse as needed   GI eval, d/w Dr, Reesa ChewNeumann  Start liquid diet and keep NPO p MN

## 2015-11-25 NOTE — Other (Signed)
Bedside and Verbal shift change report given to Arliss JourneyKenneth rn (oncoming nurse) by Manson Allanhelma J Ballou, RN (offgoing nurse). Report included the following information SBAR, Kardex, MAR and Recent Results.    SITUATION:   ? Code Status: Full Code  ? Reason for Admission: Anemia  ? Anemia    ? Hospital day: 0  ? Problem List:       Hospital Problems  Never Reviewed          Codes Class Noted POA    Anemia ICD-10-CM: D64.9  ICD-9-CM: 285.9  Unknown Unknown              BACKGROUND:    Past Medical History:   Past Medical History:   Diagnosis Date   ??? Anemia    ??? Diabetes (HCC)          Patient taking anticoagulants no     ASSESSMENT:   ? Changes in Assessment Throughout Shift: no    ? Patient has Central Line: no Reasons if yes:   ? Patient has Foley Cath: no Reasons if yes:      ? Last Vitals:     Vitals:    11/25/15 0640 11/25/15 0726 11/25/15 1229 11/25/15 1647   BP: 116/54 110/57 110/65 145/74   Pulse: 81 87 71 70   Resp: 16 16 16     Temp: 98.5 ??F (36.9 ??C) 98.6 ??F (37 ??C) 98.1 ??F (36.7 ??C) 98.2 ??F (36.8 ??C)   SpO2: 98% 100% 96% 100%   Weight:       Height:           ? IV and DRAINS (will only show if present)   Peripheral IV 11/24/15 Left Antecubital-Site Assessment: Clean, dry, & intact    ? WOUND (if present)   Wound Type:     Dressing present Dressing Present : No   Wound Concerns/Notes:  none    ? PAIN    Pain Assessment    Pain Intensity 1: 0 (11/25/15 1452)              Patient Stated Pain Goal: 0  o Interventions for Pain:    o Intervention effective:   o Time of last intervention:    o Reassessment Completed: yes     ? Last 3 Weights:  Last 3 Recorded Weights in this Encounter    11/24/15 2220   Weight: 75.4 kg (166 lb 4.8 oz)     Weight change:     ? INTAKE/OUPUT    Current Shift:      Last three shifts:      ? LAB RESULTS     Recent Labs      11/25/15   1020  11/24/15   2257   WBC  6.1  8.3   HGB  8.1*  6.4*   HCT  27.0*  23.0*   PLT  186  283        Recent Labs      11/25/15   1035  11/24/15   2257    NA  142  142   K  4.1  3.8   GLU  105*  72*   BUN  11  14   CREA  0.49*  0.59*   CA  8.8  9.2       RECOMMENDATIONS AND DISCHARGE PLANNING     1. Pending tests/procedures/ Plan of Care or Other Needs: s/p transfusion 2 units blood, awaiting gi consult     2.  Discharge plan for patient and Needs/Barriers: none    3. Estimated Discharge Date: 11/26/15 Posted on Whiteboard in Patient???s Room: yes      4. The patient's care plan was reviewed with the oncoming nurse.       "HEALS" SAFETY CHECK      Fall Risk    Total Score: 2    Safety Measures: Safety Measures: Bed/Chair-Wheels locked, Bed in low position, Call light within reach    A safety check occurred in the patient's room between off going nurse and oncoming nurse listed above.    The safety check included the below items  Area Items   H  High Alert Medications ? Verify all high alert medication drips (heparin, PCA, etc.)   E  Equipment ? Suction is set up for ALL patients (with yanker)  ? Red plugs utilized for all equipment (IV pumps, etc.)  ? WOW???s wiped down at end of shift.  ? Room stocked with oxygen, suction, and other unit-specific supplies   A  Alarms ? Bed alarm is set for fall risk patients  ? Ensure chair alarm is in place and activated if patient is up in a chair   L  Lines ? Check IV for any infiltration  ? Foley bag is empty if patient has a Foley   ? Tubing and IV bags are labeled   S  Safety   ? Room is clean, patient is clean, and equipment is clean.  ? Hallways are clear from equipment besides carts.   ? Fall bracelet on for fall risk patients  ? Ensure room is clear and free of clutter  ? Suction is set up for ALL patients (with yanker)  ? Hallways are clear from equipment besides carts.   ? Isolation precautions followed, supplies available outside room, sign posted     Manson Allan, RN

## 2015-11-25 NOTE — ED Notes (Addendum)
TRANSFER - OUT REPORT:    Verbal report given to Romalina, RN(name) on El SalvadorHelena Wohler  being transferred to 4N(unit) for routine progression of care       Report consisted of patient???s Situation, Background, Assessment and   Recommendations(SBAR).     Information from the following report(s) SBAR was reviewed with the receiving nurse.    Lines:   Peripheral IV 11/24/15 Left Antecubital (Active)   Site Assessment Clean, dry, & intact 11/24/2015 11:01 PM   Phlebitis Assessment 0 11/24/2015 11:01 PM   Infiltration Assessment 0 11/24/2015 11:01 PM   Dressing Status Clean, dry, & intact 11/24/2015 11:01 PM   Dressing Type Tape;Transparent 11/24/2015 11:01 PM   Hub Color/Line Status Pink;Flushed;Patent 11/24/2015 11:01 PM   Action Taken Blood drawn 11/24/2015 11:01 PM        Opportunity for questions and clarification was provided.      Patient transported with:   [employer]

## 2015-11-25 NOTE — Other (Signed)
Resting quietly , denies pain

## 2015-11-25 NOTE — Other (Deleted)
Patient off unit to or

## 2015-11-26 ENCOUNTER — Inpatient Hospital Stay: Admit: 2015-11-26 | Payer: PRIVATE HEALTH INSURANCE | Primary: Registered Nurse

## 2015-11-26 ENCOUNTER — Encounter: Primary: Registered Nurse

## 2015-11-26 ENCOUNTER — Ambulatory Visit: Payer: Medicare Other | Admitting: Occupational Therapy

## 2015-11-26 ENCOUNTER — Encounter: Payer: Self-pay | Admitting: Occupational Therapy

## 2015-11-26 DIAGNOSIS — M25641 Stiffness of right hand, not elsewhere classified: Secondary | ICD-10-CM

## 2015-11-26 DIAGNOSIS — M25542 Pain in joints of left hand: Secondary | ICD-10-CM

## 2015-11-26 DIAGNOSIS — M25611 Stiffness of right shoulder, not elsewhere classified: Secondary | ICD-10-CM

## 2015-11-26 DIAGNOSIS — M25612 Stiffness of left shoulder, not elsewhere classified: Secondary | ICD-10-CM

## 2015-11-26 DIAGNOSIS — M25512 Pain in left shoulder: Secondary | ICD-10-CM

## 2015-11-26 DIAGNOSIS — M25621 Stiffness of right elbow, not elsewhere classified: Secondary | ICD-10-CM | POA: Diagnosis not present

## 2015-11-26 DIAGNOSIS — M6281 Muscle weakness (generalized): Secondary | ICD-10-CM

## 2015-11-26 DIAGNOSIS — M25642 Stiffness of left hand, not elsewhere classified: Secondary | ICD-10-CM

## 2015-11-26 LAB — METABOLIC PANEL, BASIC
Anion gap: 4 mmol/L (ref 3.0–18)
BUN/Creatinine ratio: 17 (ref 12–20)
BUN: 8 MG/DL (ref 7.0–18)
CO2: 27 mmol/L (ref 21–32)
Calcium: 8.9 MG/DL (ref 8.5–10.1)
Chloride: 111 mmol/L — ABNORMAL HIGH (ref 100–108)
Creatinine: 0.46 MG/DL — ABNORMAL LOW (ref 0.6–1.3)
GFR est AA: >60 mL/min/{1.73_m2} (ref >60)
GFR est non-AA: >60 mL/min/{1.73_m2} (ref >60)
Glucose: 69 mg/dL — ABNORMAL LOW (ref 74–99)
Potassium: 3.7 mmol/L (ref 3.5–5.5)
Sodium: 142 mmol/L (ref 136–145)

## 2015-11-26 LAB — CBC WITH AUTOMATED DIFF
ABS. BASOPHILS: 0 10*3/uL (ref 0.0–0.1)
ABS. EOSINOPHILS: 0.2 10*3/uL (ref 0.0–0.4)
ABS. LYMPHOCYTES: 1.4 10*3/uL (ref 0.9–3.6)
ABS. MONOCYTES: 0.6 10*3/uL (ref 0.05–1.2)
ABS. NEUTROPHILS: 4 10*3/uL (ref 1.8–8.0)
BASOPHILS: 0 % (ref 0–2)
EOSINOPHILS: 3 % (ref 0–5)
HCT: 27.1 % — ABNORMAL LOW (ref 35.0–45.0)
HGB: 8.1 g/dL — ABNORMAL LOW (ref 12.0–16.0)
LYMPHOCYTES: 23 % (ref 21–52)
MCH: 20.9 PG — ABNORMAL LOW (ref 24.0–34.0)
MCHC: 29.9 g/dL — ABNORMAL LOW (ref 31.0–37.0)
MCV: 69.8 FL — ABNORMAL LOW (ref 74.0–97.0)
MONOCYTES: 10 % (ref 3–10)
MPV: 9.8 FL (ref 9.2–11.8)
NEUTROPHILS: 64 % (ref 40–73)
PLATELET COMMENTS: ADEQUATE
PLATELET: 235 10*3/uL (ref 135–420)
RBC: 3.88 M/uL — ABNORMAL LOW (ref 4.20–5.30)
RDW: 22.8 % — ABNORMAL HIGH (ref 11.6–14.5)
WBC: 6.2 10*3/uL (ref 4.6–13.2)

## 2015-11-26 LAB — GLUCOSE, POC
Glucose (POC): 118 mg/dL — ABNORMAL HIGH (ref 70–110)
Glucose (POC): 75 mg/dL (ref 70–110)
Glucose (POC): 90 mg/dL (ref 70–110)
Glucose (POC): 82 mg/dL (ref 70–110)
Glucose (POC): 158 mg/dL — ABNORMAL HIGH (ref 70–110)

## 2015-11-26 LAB — TYPE + CROSSMATCH
ABO/Rh(D): B POS
Antibody screen: NEGATIVE
Status of unit: TRANSFUSED
Status of unit: TRANSFUSED
Unit division: 0
Unit division: 0

## 2015-11-26 MED ORDER — PANTOPRAZOLE 40 MG TAB, DELAYED RELEASE
40 mg | Freq: Two times a day (BID) | ORAL | Status: DC
Start: 2015-11-26 — End: 2015-11-27
  Administered 2015-11-26 – 2015-11-27 (×3): via ORAL

## 2015-11-26 MED ORDER — LIDOCAINE (PF) 20 MG/ML (2 %) IJ SOLN
20 mg/mL (2 %) | INTRAMUSCULAR | Status: AC
Start: 2015-11-26 — End: ?

## 2015-11-26 MED ORDER — PROPOFOL 10 MG/ML IV EMUL
10 mg/mL | INTRAVENOUS | Status: AC
Start: 2015-11-26 — End: ?

## 2015-11-26 MED ORDER — LIDOCAINE (PF) 20 MG/ML (2 %) IJ SOLN
20 mg/mL (2 %) | INTRAMUSCULAR | Status: DC | PRN
Start: 2015-11-26 — End: 2015-11-26
  Administered 2015-11-26: 17:00:00 via INTRAVENOUS

## 2015-11-26 MED ORDER — PROPOFOL 10 MG/ML IV EMUL
10 mg/mL | INTRAVENOUS | Status: DC | PRN
Start: 2015-11-26 — End: 2015-11-26
  Administered 2015-11-26 (×2): via INTRAVENOUS

## 2015-11-26 MED FILL — PROPOFOL 10 MG/ML IV EMUL: 10 mg/mL | INTRAVENOUS | Qty: 20

## 2015-11-26 MED FILL — INSULIN LISPRO 100 UNIT/ML INJECTION: 100 unit/mL | SUBCUTANEOUS | Qty: 1

## 2015-11-26 MED FILL — LIDOCAINE (PF) 20 MG/ML (2 %) IJ SOLN: 20 mg/mL (2 %) | INTRAMUSCULAR | Qty: 5

## 2015-11-26 MED FILL — NICOTINE 14 MG/24 HR DAILY PATCH: 14 mg/24 hr | TRANSDERMAL | Qty: 1

## 2015-11-26 MED FILL — ATORVASTATIN 40 MG TAB: 40 mg | ORAL | Qty: 1

## 2015-11-26 MED FILL — INSULIN GLARGINE 100 UNIT/ML INJECTION: 100 unit/mL | SUBCUTANEOUS | Qty: 1

## 2015-11-26 MED FILL — CHOLECALCIFEROL (VITAMIN D3) 1,000 UNIT (25 MCG) TAB: ORAL | Qty: 1

## 2015-11-26 MED FILL — PANTOPRAZOLE 40 MG TAB, DELAYED RELEASE: 40 mg | ORAL | Qty: 1

## 2015-11-26 MED FILL — LISINOPRIL 5 MG TAB: 5 mg | ORAL | Qty: 1

## 2015-11-26 NOTE — Other (Signed)
Bedside and Verbal shift change report given to French Anaracy, LPN (oncoming nurse) by Halford DecampKenneth M Bowman (offgoing nurse). Report included the following information SBAR, Kardex, MAR and Recent Results.    SITUATION:   ? Code Status: Full Code  Reason for Admission: Anemia  ? Anemia    ? Hospital day: 1  ? Problem List:       Hospital Problems  Never Reviewed          Codes Class Noted POA    Anemia ICD-10-CM: D64.9  ICD-9-CM: 285.9  Unknown Unknown              BACKGROUND:    Past Medical History:   Past Medical History:   Diagnosis Date   ??? Anemia    ??? Diabetes (HCC)          Patient taking anticoagulants no     ASSESSMENT:   ? Changes in Assessment Throughout Shift: no    ? Patient has Central Line: no Reasons if yes:   ? Patient has Foley Cath: no Reasons if yes:      ? Last Vitals:     Vitals:    11/25/15 1647 11/25/15 2104 11/26/15 0053 11/26/15 0400   BP: 145/74 136/70 131/73 132/71   Pulse: 70 70 60 62   Resp:  18 18 18    Temp: 98.2 ??F (36.8 ??C) 98.3 ??F (36.8 ??C) 98.2 ??F (36.8 ??C) 98.1 ??F (36.7 ??C)   SpO2: 100% 100% 100% 99%   Weight:       Height:           ? IV and DRAINS (will only show if present)   Peripheral IV 11/24/15 Left Antecubital-Site Assessment: Clean, dry, & intact    ? WOUND (if present)   Wound Type:     Dressing present Dressing Present : No   Wound Concerns/Notes:  none    ? PAIN    Pain Assessment    Pain Intensity 1: 0 (11/26/15 0445)              Patient Stated Pain Goal: 0  o Interventions for Pain:    o Intervention effective:   o Time of last intervention:    o Reassessment Completed: yes     ? Last 3 Weights:  Last 3 Recorded Weights in this Encounter    11/24/15 2220   Weight: 75.4 kg (166 lb 4.8 oz)     Weight change:     ? INTAKE/OUPUT    Current Shift: 04/23 1901 - 04/24 0700  In: 1312.5 [P.O.:240; I.V.:1072.5]  Out: 600 [Urine:600]    Last three shifts:      ? LAB RESULTS     Recent Labs      11/26/15   0353  11/25/15   1020  11/24/15   2257   WBC  6.2  6.1  8.3    HGB  8.1*  8.1*  6.4*   HCT  27.1*  27.0*  23.0*   PLT  235  186  283        Recent Labs      11/26/15   0353  11/25/15   1035  11/24/15   2257   NA  142  142  142   K  3.7  4.1  3.8   GLU  69*  105*  72*   BUN  8  11  14    CREA  0.46*  0.49*  0.59*   CA  8.9  8.8  9.2       RECOMMENDATIONS AND DISCHARGE PLANNING     1. Pending tests/procedures/ Plan of Care or Other Needs: s/p transfusion 2 units blood, awaiting gi consult     2. Discharge plan for patient and Needs/Barriers: none    3. Estimated Discharge Date: 11/26/15 Posted on Whiteboard in Patient???s Room: yes      4. The patient's care plan was reviewed with the oncoming nurse.       "HEALS" SAFETY CHECK      Fall Risk    Total Score: 2    Safety Measures: Safety Measures: Bed/Chair-Wheels locked, Bed in low position, Call light within reach, Fall prevention (comment), Gripper socks, Side rails X 3    A safety check occurred in the patient's room between off going nurse and oncoming nurse listed above.    The safety check included the below items  Area Items   H  High Alert Medications ? Verify all high alert medication drips (heparin, PCA, etc.)   E  Equipment ? Suction is set up for ALL patients (with yanker)  ? Red plugs utilized for all equipment (IV pumps, etc.)  ? WOW???s wiped down at end of shift.  ? Room stocked with oxygen, suction, and other unit-specific supplies   A  Alarms ? Bed alarm is set for fall risk patients  ? Ensure chair alarm is in place and activated if patient is up in a chair   L  Lines ? Check IV for any infiltration  ? Foley bag is empty if patient has a Foley   ? Tubing and IV bags are labeled   S  Safety   ? Room is clean, patient is clean, and equipment is clean.  ? Hallways are clear from equipment besides carts.   ? Fall bracelet on for fall risk patients  ? Ensure room is clear and free of clutter  ? Suction is set up for ALL patients (with yanker)  ? Hallways are clear from equipment besides carts.    ? Isolation precautions followed, supplies available outside room, sign posted     Halford Decamp

## 2015-11-26 NOTE — Anesthesia Post-Procedure Evaluation (Signed)
Post-Anesthesia Evaluation and Assessment    Patient: [name] AppleHelena Hartt MRN: [id number]  SSN: [id number]    Date of Birth: Feb 06, 1956  Age: 60 y.o.  Sex: female      Data from PACU flowsheet    Cardiovascular Function/Vital Signs  Visit Vitals   ??? BP 139/63 (BP 1 Location: Right arm, BP Patient Position: At rest)   ??? Pulse (!) 58   ??? Temp 36.7 ??C (98 ??F)   ??? Resp 18   ??? Ht 4\' 11"  (1.499 m)   ??? Wt 75.4 kg (166 lb 4.8 oz)   ??? SpO2 99%   ??? BMI 33.59 kg/m2       Patient is status post general anesthesia for Procedure(s):  ENDOSCOPY.    Nausea/Vomiting: controlled    Postoperative hydration reviewed and adequate.    Pain:  Pain Scale 1: Numeric (0 - 10) (11/26/15 1307)  Pain Intensity 1: 0 (11/26/15 1307)   Managed      Mental Status and Level of Consciousness: Alert and oriented     Pulmonary Status:   O2 Device: Room air (11/26/15 1303)   Adequate oxygenation and airway patent    Complications related to anesthesia: None    Post-anesthesia assessment completed. No concerns    Signed By: Beatriz StallionIan M Lucus Lambertson, MD     November 26, 2015

## 2015-11-26 NOTE — Progress Notes (Signed)
[facility] Hospitalist Group  Progress Note    Patient: [name] Short Age: 60 y.o. DOB: [birth year] MR#: [id number] SSN: [id number]  Date/Time: 11/26/2015 10:54 AM    Subjective/24-hour events:     No bleeding or other complaints overnight.  Family present at bedside.    Assessment:   Anemia, likely acute on chronic   DM 2  HTN  Tobacco use    Plan:  EGD and TVUS today.  Post-transfusion H/H.  ACHS accuchecks and SSI.  Home antihypertensives as previously prescribed.    Case discussed with:  Patient  Family  Nursing  Case Management  DVT Prophylaxis:  Lovenox  Hep SQ  SCDs  Coumadin   On Heparin gtt    Objective:   VS:   Visit Vitals   ??? BP 153/81 (BP 1 Location: Right arm, BP Patient Position: At rest)   ??? Pulse 69   ??? Temp 98.1 ??F (36.7 ??C)   ??? Resp 20   ??? Ht  (1.499 m)   ??? Wt 75.4 kg (166 lb 4.8 oz)   ??? SpO2 98%   ??? BMI 33.59 kg/m2      Tmax/24hrs: Temp (24hrs), Avg:98.2 ??F (36.8 ??C), Min:98.1 ??F (36.7 ??C), Max:98.3 ??F (36.8 ??C)    Intake/Output Summary (Last 24 hours) at 11/26/15 1054  Last data filed at 11/26/15 7829   Gross per 24 hour   Intake           1312.5 ml   Output              950 ml   Net            362.5 ml       General:  In NAD.  Cardiovascular: RRR.   Pulmonary:  Clear, no wheezes.  Effort nonlabored.  GI:  Abdomen soft, NTTP.  Extremities:  Warm, no ischemia.  Additional:  Alert, appropriate.  Motor nonfocal.    Labs:    Recent Results (from the past 24 hour(s))   GLUCOSE, POC    Collection Time: 11/25/15 12:19 PM   Result Value Ref Range    Glucose (POC) 111 (H) 70 - 110 mg/dL   GLUCOSE, POC    Collection Time: 11/25/15  4:52 PM   Result Value Ref Range    Glucose (POC) 141 (H) 70 - 110 mg/dL   GLUCOSE, POC    Collection Time: 11/25/15 10:12 PM   Result Value Ref Range    Glucose (POC) 118 (H) 70 - 110 mg/dL   METABOLIC PANEL, BASIC    Collection Time: 11/26/15  3:53 AM   Result Value Ref Range    Sodium 142 136 - 145 mmol/L    Potassium 3.7 3.5 - 5.5 mmol/L     Chloride 111 (H) 100 - 108 mmol/L    CO2 27 21 - 32 mmol/L    Anion gap 4 3.0 - 18 mmol/L    Glucose 69 (L) 74 - 99 mg/dL    BUN 8 7.0 - 18 MG/DL    Creatinine 5.62 (L) 0.6 - 1.3 MG/DL    BUN/Creatinine ratio 17 12 - 20      GFR est AA >60 >60 ml/min/1.11m2    GFR est non-AA >60 >60 ml/min/1.15m2    Calcium 8.9 8.5 - 10.1 MG/DL   CBC WITH AUTOMATED DIFF    [facility]  3:53 AM   Result Value Ref Range    WBC 6.2 4.6 - 13.2  K/uL    RBC 3.88 (L) 4.20 - 5.30 M/uL    HGB 8.1 (L) 12.0 - 16.0 g/dL    HCT 16.127.1 (L) 09.635.0 - 45.0 %    MCV 69.8 (L) 74.0 - 97.0 FL    MCH 20.9 (L) 24.0 - 34.0 PG    MCHC 29.9 (L) 31.0 - 37.0 g/dL    RDW 04.522.8 (H) 40.911.6 - 14.5 %    PLATELET 235 135 - 420 K/uL    MPV 9.8 9.2 - 11.8 FL    NEUTROPHILS 64 40 - 73 %    LYMPHOCYTES 23 21 - 52 %    MONOCYTES 10 3 - 10 %    EOSINOPHILS 3 0 - 5 %    BASOPHILS 0 0 - 2 %    ABS. NEUTROPHILS 4.0 1.8 - 8.0 K/UL    ABS. LYMPHOCYTES 1.4 0.9 - 3.6 K/UL    ABS. MONOCYTES 0.6 0.05 - 1.2 K/UL    ABS. EOSINOPHILS 0.2 0.0 - 0.4 K/UL    ABS. BASOPHILS 0.0 0.0 - 0.1 K/UL    DF AUTOMATED      PLATELET COMMENTS ADEQUATE PLATELETS      RBC COMMENTS MICROCYTOSIS  3+        RBC COMMENTS HYPOCHROMIA  2+        RBC COMMENTS ANISOCYTOSIS  2+        RBC COMMENTS POLYCHROMASIA  1+       GLUCOSE, POC    Collection Time: 11/26/15  8:55 AM   Result Value Ref Range    Glucose (POC) 75 70 - 110 mg/dL       Signed By: Areatha KeasArmando J Ferne Ellingwood, MD     November 26, 2015 10:54 AM

## 2015-11-26 NOTE — Procedures (Signed)
[facility]  PROCEDURES    Name:  [name] Short, Matrice  MR#:  [id number]  DOB:  05/19/1956  Account #:  [id number]  Date of Adm:  11/24/2015  Date of Procedure:  11/26/2015      PREPROCEDURE DIAGNOS:    POSTPROCEDURE DIAGNOS:    PROCEDURES PERFORMED: Upper endoscopy.    INDICATION: Anemia.    ANESTHESIA: Sedation per anesthesia, monitored anesthesia care.    SPECIMENS REMOVED: None.    ESTIMATED BLOOD LOSS: 0.    OPERATOR: Philemon Kingdomaniel Samarah Hogle, MD.    INSTRUMENT: Olympus upper endoscope.    DESCRIPTION OF PROCEDURE: After informed consent was  obtained, risks, benefits and alternatives discussed, the patient brought  to endoscopy suite and placed in the left lateral decubitus position.  Scope was inserted in the oropharynx and  advanced to the  distal duodenum. Duodenum and duodenal bulb were  normal. Stomach was unremarkable. Retroflex exam was normal.  Esophagus had grade 2 erosive esophagitis and ulcerations seen at  the GE junction. No active bleeding. Remainder of the esophagus was  normal. The patient tolerated the procedure well without acute  complications. No biopsies were obtained as she is currently only less  than 24 hours off her Plavix.    IMPRESSION:  1. Grade 2 erosive esophagitis.  2. Otherwise normal upper endoscopy.    PLAN:  1. Okay to resume Plavix if absolutely necessary. Will begin Proton  pump inhibitor daily.  2. Follow hemoglobin.  3. Okay to resume diet and she will follow up with us in 8 weeks. She  will likely need another colonoscopy as her last colonoscopy was  reportedly 7 years ago (age 60) and normal by her account.        Philemon KingdomANIEL  Presly Steinruck, MD    DN / SM  D:  11/26/2015   12:58  T:  11/26/2015   14:29  Job #:  [id number]

## 2015-11-26 NOTE — Procedures (Signed)
EGD dictated. Grade 2 esophagitis, will start PPI. OK to cont Plavix if needed. Will need colo as OP in 8 weeks or so, pt aware. OK to eat. Will be available if needed.    Kris Hartmannan Shaniqua Guillot, MD

## 2015-11-26 NOTE — Procedures (Signed)
EGD dictated. Grade 2 esophagitis, will start PPI. OK to cont Plavix if needed. Will need colo as OP in 8 weeks or so, pt aware. OK to eat. Will be available if needed.    Dan Savvy Peeters, MD

## 2015-11-26 NOTE — Therapy (Signed)
[facility] [address] [place], [place], [address] Phone: [phone]   Fax:  [phone]  Occupational Therapy Treatment  Patient Details  Name: Julie Elliott MRN: [id number] Date of Birth: 07-11-1956 Referring Provider: Dr, Alysia Penna  Encounter Date: 11/26/2015      OT End of Session - 11/26/15 1710    Visit Number 6   Number of Visits 16   Date for OT Re-Evaluation 01/03/16   Authorization Type MCR primary, MCD secondary, will need G code and PN every 10th visit   Authorization Time Period 60 days   Authorization - Visit Number 6   Authorization - Number of Visits 10   OT Start Time [id number]   OT Stop Time 1700   OT Time Calculation (min) 43 min   Activity Tolerance Patient tolerated treatment well      Past Medical History  Diagnosis Date  . Hypertension     well controlled  . DJD (degenerative joint disease)   . HLD (hyperlipidemia) 2008    stable, well controlled  . DM (diabetes mellitus) ([place]) 2008    stable HgBA1C at 6.5  . GERD (gastroesophageal reflux disease)     well controlled on Omeprazole  . Chronic headaches   . CHF (congestive heart failure) [facility])     Past Surgical History  Procedure Laterality Date  . Tubal ligation    . Carpel tunnel Right     20 yrs ago  . Cervical spine surgery  2016    There were no vitals filed for this visit.      Subjective Assessment - 11/26/15 1627    Subjective  I am sleeping in my splint now and I have no problems with it at all.    Pertinent History see epic   Patient Stated Goals I want my hands back   Currently in Pain? No/denies                      OT Treatments/Exercises (OP) - 11/26/15 0001    Neurological Re-education Exercises   Other Exercises 1 Neuro re ed to address isolated movement of bilateral shoulder flexion with tone inhibition in supine and in seated.  Pt given activities to do at home and after instruction and  practice able to return demonstrate. See pt instruction section for details. Pt had no pain with any activities.    Manual Therapy   Manual Therapy Joint mobilization;Soft tissue mobilization   Manual therapy comments joint mob and soft tissue mob to L shoulder in order to decrease tightness and improve PROM as a well as AAROM/AROM in L shoulder and to decrease tone.  Pt with greatly restricted ROM and superior subluxation.                  OT Education - 11/26/15 1708    Education provided Yes   Education Details HEP for bilateral shoudlers   Person(s) Educated Patient   Methods Explanation;Demonstration;Tactile cues;Verbal cues;Handout   Comprehension Verbalized understanding;Returned demonstration          OT Short Term Goals - 11/26/15 1708    OT SHORT TERM GOAL #1   Title Pt and son will be mod I with HEP - 12/06/2015   Baseline dependent   Status On-going   OT SHORT TERM GOAL #2   Title Pt will tolerate manual techniques from a pain standpoint to be able to address improving ROM in LUE to avoid further contracture and  to reduce pain.   Baseline unable to tolerate   Status Achieved   OT SHORT TERM GOAL #3   Title Pt will be able to use L hand as stabilizer for at least 2 functional tasks.   Baseline no functional use at this time   Status Achieved           OT Long Term Goals - 11/26/15 1708    OT LONG TERM GOAL #1   Title Pt and son will be mod I with upgraded HEP - 01/03/2016   Baseline dependent   Status On-going   OT LONG TERM GOAL #2   Title Assess current splint and revise as needed   Baseline Pt did not have splint on eval however reports current splint is hard to don   Status Achieved   OT LONG TERM GOAL #3   Title Pt will demonstrate increased PROM of shoulder flexion to 50* and shoulder abduction to 65* for ease in self care activities   Baseline shoulder flexion 40, abduction 60   Status On-going   OT LONG TERM GOAL #4   Title Pt will report  increased relaxation in LUE and decreased intensity in spasm of LUE to reduce pain and discmfort   Baseline Reports severe currently   Status On-going               Plan - 11/26/15 1709    Clinical Impression Statement Pt progressing toward goals. Pt with improved functional use of L hand.   Rehab Potential Fair   OT Frequency 2x / week   OT Duration 8 weeks   OT Treatment/Interventions Self-care/ADL training;Electrical Stimulation;Moist Heat;Fluidtherapy;Ultrasound;Therapeutic exercise;Neuromuscular education;DME and/or AE instruction;Passive range of motion;Manual Therapy;Splinting;Therapeutic exercises;Therapeutic activities;Patient/family education   Plan check HEP, maual therapy to both shoulders, NMR to BUE's. trunk rotation.    Consulted and Agree with Plan of Care Patient      Patient will benefit from skilled therapeutic intervention in order to improve the following deficits and impairments:  Decreased range of motion, Decreased strength, Difficulty walking, Increased edema, Impaired UE functional use, Impaired tone, Increased muscle spasms, Pain  Visit Diagnosis: Pain in joint of left hand  Stiffness of left hand joint  Stiffness of left shoulder, not elsewhere classified  Stiffness of right shoulder, not elsewhere classified  Stiffness of right elbow, not elsewhere classified  Stiffness of right hand, not elsewhere classified  Pain in left shoulder  Muscle weakness (generalized)    Problem List Patient Active Problem List   Diagnosis Date Noted  . 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 Tobacco abuse 08/16/2015  . Peripheral neuropathy ([place]) 07/17/2015  . Cervical cord compression with myelopathy ([place]) 02/08/2015  . Frozen shoulder--left 09/05/2014  . Unintentional weight loss 06/20/2014  . Health care maintenance 03/23/2012  . Hypertension associated with diabetes ([place]) 08/17/2006  . GERD 08/17/2006  . History of diabetes mellitus, type II  08/11/2006  . Hyperlipidemia associated with type 2 diabetes mellitus ([place]) 08/11/2006  . Osteoarthritis 08/11/2006    [name], OTR/L 11/26/2015, 5:11 PM  [facility] [address] [place], [place], [address] Phone: [phone]   Fax:  [phone]  Name: Julie Elliott MRN: [id number] Date of Birth: 04-20-1956

## 2015-11-26 NOTE — Patient Instructions (Signed)
Home exercise program for your arms:  Use a large light weight beach ball for #1. STOP if you get shoulder pain and let your therapist know. Do these every day 1-2 times per day!!!  1. Lay on your back with something under your knees to take the pressure off your lower back. Hold the ball between your hands. Raise it up toward the ceiling.  Slowly raise the ball over your head as far as you can go then reach toward your knees with the ball.  Do 10, rest then do 10 more.    2. Sit on a firm surface.  Place your arms on the outside of your legs. SLOWLY lean forward brining hands as far forward as you can.  HOLD for a slow count of 5 then sit back up.  Do 10, rest then do more.  3.  Sit at a table with you belly up close to the table.  Place both arms on the table.  Make sure your whole forearm stays on the table and that you don't hike your shoulders.  SLOWLY slide arms out as far as you can in front of you on the table and then hold for a slow count 5. Return to starting position. Do 10, rest then do 10 more.

## 2015-11-26 NOTE — Anesthesia Pre-Procedure Evaluation (Addendum)
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  NO       Female?  YES       Motion sickness?  NO       Intended opioid administration for postoperative analgesia?  NO         Physical Exam    Airway  Mallampati: III  TM Distance: 4 - 6 cm  Neck ROM: normal range of motion   Mouth opening: Normal     Cardiovascular  Regular rate and rhythm,  S1 and S2 normal,  no murmur, click, rub, or gallop  Rhythm: regular  Rate: normal         Dental    Dentition: Poor dentition     Pulmonary  Breath sounds clear to auscultation               Abdominal  GI exam deferred       Other Findings            Anesthetic Plan    ASA: 3  Anesthesia type: MAC          Induction: Intravenous  Anesthetic plan and risks discussed with: Patient

## 2015-11-26 NOTE — Procedures (Signed)
[facility]  PROCEDURES    Name:  [name] Short, [name] Short  MR#:  [id number]  DOB:  12/29/1955  Account #:  [id number]  Date of Adm:  11/24/2015  Date of Procedure:  11/26/2015      PREPROCEDURE DIAGNOS:    POSTPROCEDURE DIAGNOS:    PROCEDURES PERFORMED: Upper endoscopy.    INDICATION: Anemia.    ANESTHESIA: Sedation per anesthesia, monitored anesthesia care.    SPECIMENS REMOVED: None.    ESTIMATED BLOOD LOSS: 0.    OPERATOR: Yides Saidi, MD.    INSTRUMENT: Olympus upper endoscope.    DESCRIPTION OF PROCEDURE: After informed consent was  obtained, risks, benefits and alternatives discussed, the patient brought  to endoscopy suite and placed in the left lateral decubitus position.  Scope was inserted in the oropharynx and  advanced to the  distal duodenum. Duodenum and duodenal bulb were  normal. Stomach was unremarkable. Retroflex exam was normal.  Esophagus had grade 2 erosive esophagitis and ulcerations seen at  the GE junction. No active bleeding. Remainder of the esophagus was  normal. The patient tolerated the procedure well without acute  complications. No biopsies were obtained as she is currently only less  than 24 hours off her Plavix.    IMPRESSION:  1. Grade 2 erosive esophagitis.  2. Otherwise normal upper endoscopy.    PLAN:  1. Okay to resume Plavix if absolutely necessary. Will begin Proton  pump inhibitor daily.  2. Follow hemoglobin.  3. Okay to resume diet and she will follow up with us in 8 weeks. She  will likely need another colonoscopy as her last colonoscopy was  reportedly 7 years ago (age 52) and normal by her account.        Terryl Molinelli, MD    DN / SM  D:  11/26/2015   12:58  T:  11/26/2015   14:29  Job #:  [id number]

## 2015-11-26 NOTE — Consults (Signed)
[facility]   [email or web address]      Impression:   1. Anemia, etiology not clearly GI but will r/o UGI source.      Plan:     1. Hold Plavix.  2. EGD today, if neg colo as OP off Plavix.     Informed Consent: The risks, benefits and alternatives of the procedure and the sedation options were discussed in detail. The patient is aware of potential complications including but not limited to bleeding, perforation, aspiration, infection, sedation adverse events, missed diagnosis, missed lesions, intravenous site complications, potential need for additional procedures, and death. All questions were answered. Informed consent is documented on medical record.        Chief Complaint: tired      HPI:  [name] Short is a 60 y.o. female who is being seen on consult for chronic anmenia. Nosebleed yesterday, no reported GI losses. Colo neg 7 years ago. Bowels OK. Occasional RUQ pains, not assoc w meals    PMH:   Past Medical History:   Diagnosis Date   ??? Anemia    ??? Diabetes (HCC)        PSH:   Past Surgical History:   Procedure Laterality Date   ??? HX CESAREAN SECTION  1987   ??? HX DILATION AND CURETTAGE  1995       Social HX:   Social History     Social History   ??? Marital status: SINGLE     Spouse name: N/A   ??? Number of children: N/A   ??? Years of education: N/A     Occupational History   ??? Not on file.     Social History Main Topics   ??? Smoking status: Current Every Day Smoker   ??? Smokeless tobacco: Not on file      Comment: patient state that she smokes 1 black&mild /daily   ??? Alcohol use No   ??? Drug use: No   ??? Sexual activity: No     Other Topics Concern   ??? Not on file     Social History Narrative       FHX:   Family History   Problem Relation Age of Onset   ??? Hypertension Other 35     parent,NOS   ??? Heart Disease Other 62     parent, NOS   ??? Diabetes Other 45     parent, NOS   ??? Cancer Father        Allergy:   No Known Allergies    Home Medications:      Prescriptions Prior to Admission   Medication Sig   ??? furosemide (LASIX) 20 mg tablet Take  by mouth daily.   ??? EXENATIDE MICROSPHERES (BYDUREON SC) by SubCUTAneous route.   ??? cholecalciferol (VITAMIN D3) 1,000 unit tablet Take 1 tablet by mouth daily.   ??? insulin aspart protamine/insulin aspart (NOVOLOG MIX 70-30 FLEXPEN) 100 unit/mL (70-30) flex pen 30 units SQ every morning and 25 units SQ every evening   ??? aspirin 81 mg chewable tablet Take 1 tablet by mouth daily.   ??? atorvastatin (LIPITOR) 40 mg tablet Take 1 tablet by mouth nightly.   ??? clopidogrel (PLAVIX) 75 mg tablet Take 1 tablet by mouth daily.   ??? lisinopril (PRINIVIL, ZESTRIL) 5 mg tablet Take 1 tablet by mouth daily.   ??? OTHER Check CBC, CMP, Mg in 5 days. Results to PCP    Dx-CVA   ??? OTHER This is  to certify that  Kalimah Capurro  was admitted to [facility] on 04/23/2013 and discharged on 04/25/2013 , and has been advised to take rest at home for 14 ( Fourteen ) more days and then resume work if symptom free.       Review of Systems:     Constitutional: No fevers, chills, weight loss, fatigue.   Skin: No rashes, pruritis, jaundice, ulcerations, erythema.   HENT: No headaches, + nosebleeds, sinus pressure, rhinorrhea, sore throat.   Eyes: No visual changes, blurred vision, eye pain, photophobia, jaundice.   Cardiovascular: No chest pain, heart palpitations.   Respiratory: No cough, SOB, wheezing, chest discomfort, orthopnea.   Gastrointestinal: neg   Genitourinary: No dysuria, bleeding, discharge, pyuria.   Musculoskeletal: No weakness, arthralgias, wasting.   Endo: No sweats.   Heme: No bruising, easy bleeding.   Allergies: As noted.   Neurological: Cranial nerves intact.  Alert and oriented. Gait not assessed.   Psychiatric:  No anxiety, depression, hallucinations.                 Visit Vitals   ??? BP 132/71 (BP 1 Location: Left arm, BP Patient Position: At rest)   ??? Pulse 62   ??? Temp 98.1 ??F (36.7 ??C)   ??? Resp 18   ??? Ht  (1.499 m)    ??? Wt 75.4 kg (166 lb 4.8 oz)   ??? SpO2 99%   ??? BMI 33.59 kg/m2       Physical Assessment:     constitutional: appearance: well developed, normal habitus, no deformities, in no acute distress.   skin: inspection: no rashes, ulcers, icterus or other lesions; no clubbing or telangiectasias. palpation: no induration or subcutaneos nodules.   eyes: inspection: normal conjunctivae and lids; no jaundice pupils: symmetrical, normoreactive to light, normal accommodation and size.   ENMT: mouth: normal oral mucosa,lips and gums; good dentition.   respiratory: effort: normal chest excursion; no intercostal retraction or accessory muscle use.   cardiovascular: abdominal aorta: normal size and position; no bruits. palpation: PMI of normal size and position; normal rhythm; no thrill or murmurs.   abdominal: abdomen: normal consistency; no tenderness or masses. hernias: no hernias appreciated. liver: normal size and consistency. spleen: not palpable.   rectal: hemoccult/guaiac: not performed.   musculoskeletal: digits and nails: not assessed.   neurologic: cranial nerves: II-XII normal.   psychiatric: judgement/insight: within normal limits. memory: within normal limits for recent and remote events. mood and affect: no evidence of depression, anxiety or agitation. orientation: oriented to time, space and person.        Basic Metabolic Profile   Recent Labs      11/26/15   0353   NA  142   K  3.7   CL  111*   CO2  27   BUN  8   GLU  69*   CA  8.9         CBC w/Diff    Recent Labs      11/26/15   0353   WBC  6.2   RBC  3.88*   HGB  8.1*   HCT  27.1*   MCV  69.8*   MCH  20.9*   MCHC  29.9*   RDW  22.8*   PLT  235    Recent Labs      11/26/15   0353   GRANS  64   LYMPH  23   EOS  3  Hepatic Function   No results for input(s): ALB, TP, TBILI, GPT, SGOT, AP, AML, LPSE in the last 72 hours.    No lab exists for component: DBILI       Philemon Kingdom, MD, M.D.   [facility] [place]  [phone]   [email or web address]

## 2015-11-26 NOTE — Other (Signed)
TRANSFER - IN REPORT:    Verbal report received from Scribnerracy on [name] Short  being received from [address] for routine progression of care      Report consisted of patient???s Situation, Background, Assessment and   Recommendations(SBAR).     Information from the following report(s) SBAR was reviewed with the receiving nurse.    Opportunity for questions and clarification was provided.      Assessment completed upon patient???s arrival to unit and care assumed.     French Anaracy aware to complete vital signs, checklist, and blood sugar prior to patient transfer.

## 2015-11-26 NOTE — Other (Signed)
TRANSFER - OUT REPORT:    Verbal report given to [facility] L[facility](name) on [name]  being transferred to [address](unit) for routine post - op       Report consisted of patient???s Situation, Background, Assessment and   Recommendations(SBAR).     Information from the following report(s) SBAR, Kardex, OR Summary, Procedure Summary, Intake/Output, MAR, Recent Results and Med Rec Status was reviewed with the receiving nurse.    Lines:   Peripheral IV 11/24/15 Left Antecubital (Active)   Site Assessment Clean, dry, & intact 11/26/2015 12:56 PM   Phlebitis Assessment 0 11/26/2015 12:56 PM   Infiltration Assessment 0 11/26/2015 12:56 PM   Dressing Status Clean, dry, & intact 11/26/2015 12:56 PM   Dressing Type Tape;Transparent 11/26/2015 12:56 PM   Hub Color/Line Status Pink;Infusing 11/26/2015 12:56 PM   Action Taken Blood drawn 11/24/2015 11:01 PM        Opportunity for questions and clarification was provided.      Patient transported with:   Tech    Nurse notified that patient is going to ultrasound prior to returning to Unit.

## 2015-11-27 LAB — METABOLIC PANEL, BASIC
Anion gap: 6 mmol/L (ref 3.0–18)
BUN/Creatinine ratio: 15 (ref 12–20)
BUN: 11 MG/DL (ref 7.0–18)
CO2: 26 mmol/L (ref 21–32)
Calcium: 9.5 MG/DL (ref 8.5–10.1)
Chloride: 109 mmol/L — ABNORMAL HIGH (ref 100–108)
Creatinine: 0.73 MG/DL (ref 0.6–1.3)
GFR est AA: >60 mL/min/{1.73_m2} (ref >60)
GFR est non-AA: >60 mL/min/{1.73_m2} (ref >60)
Glucose: 140 mg/dL — ABNORMAL HIGH (ref 74–99)
Potassium: 3.9 mmol/L (ref 3.5–5.5)
Sodium: 141 mmol/L (ref 136–145)

## 2015-11-27 LAB — GLUCOSE, POC
Glucose (POC): 146 mg/dL — ABNORMAL HIGH (ref 70–110)
Glucose (POC): 120 mg/dL — ABNORMAL HIGH (ref 70–110)
Glucose (POC): 179 mg/dL — ABNORMAL HIGH (ref 70–110)
Glucose (POC): 147 mg/dL — ABNORMAL HIGH (ref 70–110)

## 2015-11-27 LAB — HGB & HCT
HCT: 27.9 % — ABNORMAL LOW (ref 35.0–45.0)
HGB: 8.3 g/dL — ABNORMAL LOW (ref 12.0–16.0)

## 2015-11-27 MED ORDER — PANTOPRAZOLE 40 MG TAB, DELAYED RELEASE
40 mg | ORAL_TABLET | Freq: Two times a day (BID) | ORAL | 1 refills | Status: DC
Start: 2015-11-27 — End: 2016-12-21

## 2015-11-27 MED FILL — PANTOPRAZOLE 40 MG TAB, DELAYED RELEASE: 40 mg | ORAL | Qty: 1

## 2015-11-27 MED FILL — ATORVASTATIN 40 MG TAB: 40 mg | ORAL | Qty: 1

## 2015-11-27 MED FILL — INSULIN LISPRO 100 UNIT/ML INJECTION: 100 unit/mL | SUBCUTANEOUS | Qty: 1

## 2015-11-27 MED FILL — XYLOCAINE-MPF 20 MG/ML (2 %) INJECTION SOLUTION: 20 mg/mL (2 %) | INTRAMUSCULAR | Qty: 5

## 2015-11-27 MED FILL — PROPOFOL 10 MG/ML IV EMUL: 10 mg/mL | INTRAVENOUS | Qty: 20

## 2015-11-27 MED FILL — CHOLECALCIFEROL (VITAMIN D3) 1,000 UNIT (25 MCG) TAB: ORAL | Qty: 1

## 2015-11-27 MED FILL — NICOTINE 14 MG/24 HR DAILY PATCH: 14 mg/24 hr | TRANSDERMAL | Qty: 1

## 2015-11-27 MED FILL — LISINOPRIL 5 MG TAB: 5 mg | ORAL | Qty: 1

## 2015-11-27 NOTE — Other (Signed)
Bedside and Verbal shift change report given to Joni Reining, RN (oncoming nurse) by Halford Decamp (offgoing nurse). Report included the following information SBAR, Kardex, MAR and Recent Results.    SITUATION:   ? Code Status: Full Code  Reason for Admission: Anemia  Anemia  ? DX    ? Hospital day: 2  ? Problem List:       Hospital Problems  Date Reviewed: Dec 16, 2015          Codes Class Noted POA    Anemia ICD-10-CM: D64.9  ICD-9-CM: 285.9  Unknown Unknown              BACKGROUND:    Past Medical History:   Past Medical History:   Diagnosis Date   ??? Anemia    ??? Diabetes (HCC)          Patient taking anticoagulants no     ASSESSMENT:   ? Changes in Assessment Throughout Shift: no    ? Patient has Central Line: no Reasons if yes:   ? Patient has Foley Cath: no Reasons if yes:      ? Last Vitals:     Vitals:    Dec 16, 2015 1658 12/16/2015 2033 11/27/15 0059 11/27/15 0358   BP: 142/77 145/78 147/79 133/79   Pulse: (!) 59 71 72 74   Resp: Temp: 98.2 ??F (36.8 ??C) 98.3 ??F (36.8 ??C) 98.1 ??F (36.7 ??C) 98.2 ??F (36.8 ??C)   SpO2:  100% 100% 100%   Weight:       Height:           ? IV and DRAINS (will only show if present)   Peripheral IV 11/24/15 Left Antecubital-Site Assessment: Clean, dry, & intact    ? WOUND (if present)   Wound Type:     Dressing present Dressing Present : No   Wound Concerns/Notes:  none    ? PAIN    Pain Assessment    Pain Intensity 1: 0 (11/27/15 0358)    Pain Location 1: Head         Patient Stated Pain Goal: 0  o Interventions for Pain:    o Intervention effective:   o Time of last intervention:    o Reassessment Completed: yes     ? Last 3 Weights:  Last 3 Recorded Weights in this Encounter    11/24/15 2220   Weight: 75.4 kg (166 lb 4.8 oz)     Weight change:     ? INTAKE/OUPUT    Current Shift: 2022/12/16 1901 - 04/25 0700  In: 480 [P.O.:480]  Out: 350 [Urine:350]    Last three shifts: 04/23 0701 - 16-Dec-2022 1900  In: 2172.5 [P.O.:800; I.V.:1372.5]  Out: 1500 [Urine:1500]    ? LAB RESULTS      Recent Labs      11/27/15   0401  12/16/15   0353  11/25/15   1020  11/24/15   2257   WBC   --   6.2  6.1  8.3   HGB  8.3*  8.1*  8.1*  6.4*   HCT  27.9*  27.1*  27.0*  23.0*   PLT   --   235  186  283        Recent Labs      11/27/15   0401  2015/12/16   0353  11/25/15   1035   NA  141  142  142   K  3.9  3.7  4.1   GLU  140*  69*  105*   BUN  11  8  11    CREA  0.73  0.46*  0.49*   CA  9.5  8.9  8.8       RECOMMENDATIONS AND DISCHARGE PLANNING     1. Pending tests/procedures/ Plan of Care or Other Needs: s/p transfusion 2 units blood, awaiting gi consult     2. Discharge plan for patient and Needs/Barriers: none    3. Estimated Discharge Date: 11/26/15 Posted on Whiteboard in Patient???s Room: yes      4. The patient's care plan was reviewed with the oncoming nurse.       "HEALS" SAFETY CHECK      Fall Risk    Total Score: 1    Safety Measures: Safety Measures: Bed/Chair-Wheels locked, Bed in low position, Call light within reach, Fall prevention (comment), Family at bedside, Gripper socks, Side rails X 3    A safety check occurred in the patient's room between off going nurse and oncoming nurse listed above.    The safety check included the below items  Area Items   H  High Alert Medications ? Verify all high alert medication drips (heparin, PCA, etc.)   E  Equipment ? Suction is set up for ALL patients (with yanker)  ? Red plugs utilized for all equipment (IV pumps, etc.)  ? WOW???s wiped down at end of shift.  ? Room stocked with oxygen, suction, and other unit-specific supplies   A  Alarms ? Bed alarm is set for fall risk patients  ? Ensure chair alarm is in place and activated if patient is up in a chair   L  Lines ? Check IV for any infiltration  ? Foley bag is empty if patient has a Foley   ? Tubing and IV bags are labeled   S  Safety   ? Room is clean, patient is clean, and equipment is clean.  ? Hallways are clear from equipment besides carts.   ? Fall bracelet on for fall risk patients   ? Ensure room is clear and free of clutter  ? Suction is set up for ALL patients (with yanker)  ? Hallways are clear from equipment besides carts.   ? Isolation precautions followed, supplies available outside room, sign posted     Halford DecampKenneth M Bowman

## 2015-11-27 NOTE — Progress Notes (Signed)
[facility] [place] [place]  [phone]  [email or web address]         Impression/Plan:  1.  Anemia. EGD w erosive esophagitis. Cont PPI. F/U w GI in 8-12 weeks to discuss colonoscopy. Will sign off and be avil if needed.      Chief Complaint: none      Subjective:  No bleeding and Hg stable        Eyes: conjunctiva normal, EOM normal   Neck: ROM normal, supple and trachea normal   Cardiovascular: heart normal, intact distal pulses, normal rate and regular rhythm   Pulmonary/Chest Wall: breath sounds normal and effort normal   Abdominal: appearance normal, bowel sounds normal and soft, non-acute, non-tender                Visit Vitals   ??? BP 133/79 (BP 1 Location: Right arm, BP Patient Position: At rest)   ??? Pulse 74   ??? Temp 98.2 ??F (36.8 ??C)   ??? Resp 18   ??? Ht 4\' 11"  (1.499 m)   ??? Wt 75.4 kg (166 lb 4.8 oz)   ??? SpO2 100%   ??? BMI 33.59 kg/m2           Intake/Output Summary (Last 24 hours) at 11/27/15 0833  Last data filed at 11/26/15 2000   Gross per 24 hour   Intake             1340 ml   Output              900 ml   Net              440 ml       CBC w/Diff    Lab Results   Component Value Date/Time    WBC 6.2 11/26/2015 03:53 AM    RBC 3.88 (L) 11/26/2015 03:53 AM    HGB 8.3 (L) 11/27/2015 04:01 AM    HCT 27.9 (L) 11/27/2015 04:01 AM    MCV 69.8 (L) 11/26/2015 03:53 AM    MCH 20.9 (L) 11/26/2015 03:53 AM    MCHC 29.9 (L) 11/26/2015 03:53 AM    RDW 22.8 (H) 11/26/2015 03:53 AM    PLT 235 11/26/2015 03:53 AM    Lab Results   Component Value Date/Time    GRANS 64 11/26/2015 03:53 AM    LYMPH 23 11/26/2015 03:53 AM    EOS 3 11/26/2015 03:53 AM    BASOS 0 11/26/2015 03:53 AM      Basic Metabolic Profile   Recent Labs      11/27/15   0401   NA  141   K  3.9   CL  109*   CO2  26   BUN  11   CA  9.5        Hepatic Function    Lab Results   Component Value Date/Time    ALB 3.4 03/23/2009 10:39 AM    TP 6.7 03/23/2009 10:39 AM    AP 81 03/23/2009 10:39 AM    Lab Results    Component Value Date/Time    SGOT 32 03/23/2009 10:39 AM        @LABRCNT (CULT:3)                 )[name] Kingdomaniel Taje Littler, MD, MD  [facility] [place] [place]  [phone]  [email or web address]

## 2015-11-27 NOTE — Other (Signed)
Glycemic Control Plan of Care    Assessment/Recommendations:  Patient is 60 year old with past medical history including type 2 diabetes mellitus, CVA, hypertension, and tobacco abuse - was admitted on 11/24/2015 for evaluation of abnormal lab results and had nose bleed the day prior. She was reportedly at her PCP and was directed to go to ED.    Noted:  Anemia. Status post upper endoscopy on 11/26/2015: grade 2 erosive esophagitis.  Type 2 diabetes mellitus with A1C of 6.1% (11/25/2015).    POC BG range on 11/26/2015: 75-158 mg/dL.  POC BG report on 11/27/2015 at time of review: 120, 179 mg/dL.    Patient stated that she is scheduled for discharge later today. Patient stated that she has BG monitor device and testing supplies, Novolog 70/30 mix insulin, and Bydureon SC.    Most recent blood glucose values:    Results for Short Short (MRN [id number]) as of 11/27/2015 15:24   Ref. Range 11/26/2015 08:55 11/26/2015 11:13 11/26/2015 13:12 11/26/2015 17:14 11/26/2015 21:53   GLUCOSE,FAST - POC Latest Ref Range: 70 - 110 mg/dL 75 90 82 962158 (H) 952146 (H)     Results for Short Short (MRN [id number]) as of 11/27/2015 15:24   Ref. Range 11/27/2015 07:50 11/27/2015 11:51   GLUCOSE,FAST - POC Latest Ref Range: 70 - 110 mg/dL 027120 (H) 253179 (H)     Current A1C: 6.1% (11/25/2015) is equivalent to average blood glucose of 128 mg/dL during the past 2-3 months.    Current hospital diabetes medications:  Basal lantus insulin 30 units daily at bedtime.  Prandial lispro insulin 5 units TID AC.  Correctional lispro insulin ACHS. Normal sensitivity dose.    Total daily dose insulin requirement previous day: 11/26/2015:  Lantus: 30 units  Lispro: 7 units    Home diabetes medications: As stated by patient on 11/27/2015:  Novolog 70/30 mix insulin: 30 units daily every morning before breakfast and 30 units daily before dinner.  Bydureon SC weekly. Patient cannot remember the dosage.    Diet: Diabetic consistent carb regular.     Goals:  Blood glucose will be within target range of 70-180 mg/dL by 66/44/034704/28/2017     Education:  ___  Refer to Diabetes Education Record             __X_  Education not indicated at this time    Sharyne RichtersArnel Rodriguez, RN

## 2015-11-27 NOTE — Progress Notes (Addendum)
Care Management Interventions  PCP Verified by CM: Yes (dr. Andrena Mewsdajao)  Palliative Care Consult (Criteria: CHF and RRAT>21): No  Reason for No Palliative Care Consult: Other (see comment) (NO ORDER)  Mode of Transport at Discharge: Other (see comment) (PT'S DAUGHTER WILL TRANSPORT THIS PT HOME)  Transition of Care Consult (CM Consult): Other  Current Support Network: Own Home, Family Lives Nearby  Confirm Follow Up Transport: Family (PT'S DAUGHTER WILL TRANSPORT HOME WHEN READY IF NEEDED)  Plan discussed with Pt/Family/Caregiver: Yes (RETURN HOME)  Discharge Location  Discharge Placement: Home with family assistance  Pt's daugther [name] Short [phone] will transport this pt home at her discharge.

## 2015-11-27 NOTE — Other (Signed)
Please clarify if  you feel the erosive esophagitis  is the cause of   patient's anemia?    =>      Thank you,                    Jolene ProvostKathy Humphrey   RN, BSN, CCDS    [phone]

## 2015-11-27 NOTE — Discharge Summary (Signed)
Discharge Summary    Patient: [name] Short MRN: [id number]  CSN: [id number]    Date of Birth: 1956/03/12  Age: 60 y.o.  Sex: female    DOA: 11/24/2015 LOS:  LOS: 2 days   Discharge Date: 11/27/2015     Admission Diagnoses:   Anemia    Discharge Diagnoses:    Anemia, likely acute on chronic   DM 2  HTN  Tobacco use      Discharge Condition: Stable    PHYSICAL EXAM  Visit Vitals   ??? BP 136/69 (BP 1 Location: Right arm, BP Patient Position: At rest)   ??? Pulse 68   ??? Temp 98 ??F (36.7 ??C)   ??? Resp 18   ??? Ht 4\' 11"  (1.499 m)   ??? Wt 75.4 kg (166 lb 4.8 oz)   ??? SpO2 99%   ??? BMI 33.59 kg/m2       General: In NAD.  HEENT: NCAT. Sclerae anicteric, EOMI.    Lungs:  CTAB, no R/W/R.  Heart:  RRR.  Abdomen: Soft, NTTP.  Extremities: Warm, no ischemia or edema.  Psych:???? Mood normal.  Neurologic:?? Motor nonfocal.    Hospital Course:   Patient admitted to medical bed for observation.  She was transfused 2 units of PRBCs and had no evidence for acute blood loss while hospitalized.  EGD showed erosive esophagitis and PPI was continued as already initiated.  Patient has remained stable otherwise and is felt to be medically stable for discharge home with outpatient follow up as advised.      Consults:   GI      Significant Diagnostic Studies:   EGD:  DESCRIPTION OF PROCEDURE: After informed consent was  obtained, risks, benefits and alternatives discussed, the patient brought  to endoscopy suite and placed in the left lateral decubitus position.  Scope was inserted in the oropharynx and advanced to the  distal duodenum. Duodenum and duodenal bulb were  normal. Stomach was unremarkable. Retroflex exam was normal.  Esophagus had grade 2 erosive esophagitis and ulcerations seen at  the GE junction. No active bleeding. Remainder of the esophagus was  normal. The patient tolerated the procedure well without acute  complications. No biopsies were obtained as she is currently only less  than 24 hours off her Plavix.    ??  IMPRESSION:   1. Grade 2 erosive esophagitis.  2. Otherwise normal upper endoscopy.    PLAN:  1. Okay to resume Plavix if absolutely necessary. Will begin Proton  pump inhibitor daily.  2. Follow hemoglobin.  3. Okay to resume diet and she will follow up with us in 8 weeks. She  will likely need another colonoscopy as her last colonoscopy was  reportedly 7 years ago (age 60) and normal by her account.        Discharge Medications:     Current Discharge Medication List      START taking these medications    Details   pantoprazole (PROTONIX) 40 mg tablet Take 1 Tab by mouth Before breakfast and dinner.  Qty: 60 Tab, Refills: 1         CONTINUE these medications which have NOT CHANGED    Details   furosemide (LASIX) 20 mg tablet Take  by mouth daily.      EXENATIDE MICROSPHERES (BYDUREON SC) by SubCUTAneous route.      cholecalciferol (VITAMIN D3) 1,000 unit tablet Take 1 tablet by mouth daily.  Qty: 30 tablet, Refills: 0      insulin  aspart protamine/insulin aspart (NOVOLOG MIX 70-30 FLEXPEN) 100 unit/mL (70-30) flex pen 30 units SQ every morning and 25 units SQ every evening  Qty: 1 Package, Refills: 0      aspirin 81 mg chewable tablet Take 1 tablet by mouth daily.  Qty: 30 tablet, Refills: 2      atorvastatin (LIPITOR) 40 mg tablet Take 1 tablet by mouth nightly.  Qty: 30 tablet, Refills: 0      clopidogrel (PLAVIX) 75 mg tablet Take 1 tablet by mouth daily.  Qty: 30 tablet, Refills: 2      lisinopril (PRINIVIL, ZESTRIL) 5 mg tablet Take 1 tablet by mouth daily.  Qty: 30 tablet, Refills: 0         STOP taking these medications       OTHER Comments:   Reason for Stopping:         OTHER Comments:   Reason for Stopping:               Activity: As tolerated    Diet: Cardiac Diet and Diabetic Diet    Follow-up: with PCP, ROGACIANO Otila Back, MD in 1-2 weeks.       Melvern Sample. Lindie Spruce, MD  [facility]

## 2015-11-29 ENCOUNTER — Ambulatory Visit: Payer: Medicare Other | Admitting: Occupational Therapy

## 2015-11-29 ENCOUNTER — Encounter: Payer: Self-pay | Admitting: Occupational Therapy

## 2015-11-29 DIAGNOSIS — M25612 Stiffness of left shoulder, not elsewhere classified: Secondary | ICD-10-CM | POA: Diagnosis not present

## 2015-11-29 DIAGNOSIS — M25642 Stiffness of left hand, not elsewhere classified: Secondary | ICD-10-CM

## 2015-11-29 DIAGNOSIS — M25512 Pain in left shoulder: Secondary | ICD-10-CM

## 2015-11-29 DIAGNOSIS — M25621 Stiffness of right elbow, not elsewhere classified: Secondary | ICD-10-CM | POA: Diagnosis not present

## 2015-11-29 DIAGNOSIS — M25641 Stiffness of right hand, not elsewhere classified: Secondary | ICD-10-CM | POA: Diagnosis not present

## 2015-11-29 DIAGNOSIS — M25611 Stiffness of right shoulder, not elsewhere classified: Secondary | ICD-10-CM | POA: Diagnosis not present

## 2015-11-29 DIAGNOSIS — M25542 Pain in joints of left hand: Secondary | ICD-10-CM | POA: Diagnosis not present

## 2015-11-29 DIAGNOSIS — M6281 Muscle weakness (generalized): Secondary | ICD-10-CM

## 2015-11-29 NOTE — Therapy (Signed)
[facility] [address] [place], [place], [address] Phone: [phone]   Fax:  [phone]  Occupational Therapy Treatment  Patient Details  Name: Julie Elliott MRN: [id number] Date of Birth: 12-06-1955 Referring Provider: Dr, Alysia Penna  Encounter Date: 11/29/2015      OT End of Session - 11/29/15 1709    Visit Number 7   Number of Visits 16   Date for OT Re-Evaluation 01/03/16   Authorization Type MCR primary, MCD secondary, will need G code and PN every 10th visit   Authorization Time Period 60 days   Authorization - Visit Number 7   Authorization - Number of Visits 10   OT Start Time [id number]   OT Stop Time [id number]   OT Time Calculation (min) 42 min   Activity Tolerance Patient tolerated treatment well      Past Medical History  Diagnosis Date  . Hypertension     well controlled  . DJD (degenerative joint disease)   . HLD (hyperlipidemia) 2008    stable, well controlled  . DM (diabetes mellitus) ([place]) 2008    stable HgBA1C at 6.5  . GERD (gastroesophageal reflux disease)     well controlled on Omeprazole  . Chronic headaches   . CHF (congestive heart failure) [facility])     Past Surgical History  Procedure Laterality Date  . Tubal ligation    . Carpel tunnel Right     20 yrs ago  . Cervical spine surgery  2016    There were no vitals filed for this visit.      Subjective Assessment - 11/29/15 1624    Subjective  My right shoulder was bothering me the last two days but you worked on my left shoulder the last time I was here.    Pertinent History see epic   Patient Stated Goals I want my hands back   Currently in Pain? No/denies                      OT Treatments/Exercises (OP) - 11/29/15 0001    Neurological Re-education Exercises   Other Exercises 1 Neuro re ed to address active LUE active abduction and shoulder flexion following manual therapy - in closed chain activity in supine  pt able to complete 90* of shoulder flexion and 70* active shouulder abduction.  Pt also continues to improve in AROM of L hand and can now use L hand as stabilizer as well as gross assist for some activities.    Manual Therapy   Manual Therapy Joint mobilization;Soft tissue mobilization;Scapular mobilization   Manual therapy comments Joint, soft tissue and scapular mob to LUE to increasse PROM/AAROM/AROM for shoulder flexion/abduction.  Pt today able to complete PROM of abduction to 80* and shoulder flexion to PROM to 105*  See above for AROM                  OT Short Term Goals - 11/29/15 1707    OT SHORT TERM GOAL #1   Title Pt and son will be mod I with HEP - 12/06/2015   Baseline dependent   Status On-going   OT SHORT TERM GOAL #2   Title Pt will tolerate manual techniques from a pain standpoint to be able to address improving ROM in LUE to avoid further contracture and to reduce pain.   Baseline unable to tolerate   Status Achieved   OT SHORT TERM GOAL #3   Title Pt will  be able to use L hand as stabilizer for at least 2 functional tasks.   Baseline no functional use at this time   Status Achieved           OT Long Term Goals - 11/29/15 1707    OT LONG TERM GOAL #1   Title Pt and son will be mod I with upgraded HEP - 01/03/2016   Baseline dependent   Status On-going   OT LONG TERM GOAL #2   Title Assess current splint and revise as needed   Baseline Pt did not have splint on eval however reports current splint is hard to don   Status Achieved   OT LONG TERM GOAL #3   Title Pt will demonstrate increased PROM of shoulder flexion to 50* and shoulder abduction to 65* for ease in self care activities   Baseline shoulder flexion 40, abduction 60   Status On-going   OT LONG TERM GOAL #4   Title Pt will report increased relaxation in LUE and decreased intensity in spasm of LUE to reduce pain and discmfort   Baseline Reports severe currently   Status On-going                Plan - 11/29/15 1708    Clinical Impression Statement Pt making good progress toward goals.  Pt gaining in ROM of LUE and improved functional use of LUE   Rehab Potential Fair   OT Frequency 2x / week   OT Duration 8 weeks   OT Treatment/Interventions Self-care/ADL training;Electrical Stimulation;Moist Heat;Fluidtherapy;Ultrasound;Therapeutic exercise;Neuromuscular education;DME and/or AE instruction;Passive range of motion;Manual Therapy;Splinting;Therapeutic exercises;Therapeutic activities;Patient/family education   Plan maual therapy for both shoulders, NMR to BUE's, trunk rotation.       Patient will benefit from skilled therapeutic intervention in order to improve the following deficits and impairments:  Decreased range of motion, Decreased strength, Difficulty walking, Increased edema, Impaired UE functional use, Impaired tone, Increased muscle spasms, Pain  Visit Diagnosis: Pain in joint of left hand  Stiffness of left hand joint  Stiffness of left shoulder, not elsewhere classified  Stiffness of right shoulder, not elsewhere classified  Stiffness of right elbow, not elsewhere classified  Stiffness of right hand, not elsewhere classified  Pain in left shoulder  Muscle weakness (generalized)    Problem List Patient Active Problem List   Diagnosis Date Noted  . 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 Tobacco abuse 08/16/2015  . Peripheral neuropathy ([place]) 07/17/2015  . Cervical cord compression with myelopathy ([place]) 02/08/2015  . Frozen shoulder--left 09/05/2014  . Unintentional weight loss 06/20/2014  . Health care maintenance 03/23/2012  . Hypertension associated with diabetes ([place]) 08/17/2006  . GERD 08/17/2006  . History of diabetes mellitus, type II 08/11/2006  . Hyperlipidemia associated with type 2 diabetes mellitus ([place]) 08/11/2006  . Osteoarthritis 08/11/2006    [name], OTR/L 11/29/2015, 5:11 PM  [facility] [address] [place], [place], [address] Phone: [phone]   Fax:  [phone]  Name: Julie Elliott MRN: [id number] Date of Birth: 21-Jul-1956

## 2015-12-03 ENCOUNTER — Ambulatory Visit: Payer: Medicare Other | Attending: Physical Medicine & Rehabilitation | Admitting: Occupational Therapy

## 2015-12-03 ENCOUNTER — Encounter: Payer: Self-pay | Admitting: Occupational Therapy

## 2015-12-03 DIAGNOSIS — M25641 Stiffness of right hand, not elsewhere classified: Secondary | ICD-10-CM | POA: Diagnosis not present

## 2015-12-03 DIAGNOSIS — M6281 Muscle weakness (generalized): Secondary | ICD-10-CM | POA: Diagnosis not present

## 2015-12-03 DIAGNOSIS — M25542 Pain in joints of left hand: Secondary | ICD-10-CM | POA: Insufficient documentation

## 2015-12-03 DIAGNOSIS — R2681 Unsteadiness on feet: Secondary | ICD-10-CM | POA: Insufficient documentation

## 2015-12-03 DIAGNOSIS — M25611 Stiffness of right shoulder, not elsewhere classified: Secondary | ICD-10-CM | POA: Insufficient documentation

## 2015-12-03 DIAGNOSIS — M25512 Pain in left shoulder: Secondary | ICD-10-CM | POA: Insufficient documentation

## 2015-12-03 DIAGNOSIS — M25642 Stiffness of left hand, not elsewhere classified: Secondary | ICD-10-CM | POA: Insufficient documentation

## 2015-12-03 DIAGNOSIS — M6249 Contracture of muscle, multiple sites: Secondary | ICD-10-CM | POA: Diagnosis not present

## 2015-12-03 DIAGNOSIS — R2689 Other abnormalities of gait and mobility: Secondary | ICD-10-CM | POA: Diagnosis not present

## 2015-12-03 DIAGNOSIS — M25612 Stiffness of left shoulder, not elsewhere classified: Secondary | ICD-10-CM | POA: Insufficient documentation

## 2015-12-03 NOTE — Therapy (Signed)
[facility] [address] [place], [place], [address] Phone: [phone]   Fax:  [phone]  Occupational Therapy Treatment  Patient Details  Name: Julie Elliott MRN: [id number] Date of Birth: [birth year] Referring Provider: Dr, Alysia Penna  Encounter Date: 12/03/2015      OT End of Session - 12/03/15 1707    Visit Number 8   Number of Visits 16   Date for OT Re-Evaluation 01/03/16   Authorization Type MCR primary, MCD secondary, will need G code and PN every 10th visit   Authorization Time Period 60 days   Authorization - Visit Number 8   Authorization - Number of Visits 10   OT Start Time 1616   OT Stop Time 1700   OT Time Calculation (min) 44 min   Activity Tolerance Patient tolerated treatment well      Past Medical History  Diagnosis Date  . Hypertension     well controlled  . DJD (degenerative joint disease)   . HLD (hyperlipidemia) 2008    stable, well controlled  . DM (diabetes mellitus) ([place]) 2008    stable HgBA1C at 6.5  . GERD (gastroesophageal reflux disease)     well controlled on Omeprazole  . Chronic headaches   . CHF (congestive heart failure) [facility])     Past Surgical History  Procedure Laterality Date  . Tubal ligation    . Carpel tunnel Right     20 yrs ago  . Cervical spine surgery  2016    There were no vitals filed for this visit.      Subjective Assessment - 12/03/15 1622    Subjective  My shoulders were pretty good after last session.   Patient is accompained by: Family member  son   Pertinent History see epic   Patient Stated Goals I want my hands back   Currently in Pain? No/denies                      OT Treatments/Exercises (OP) - 12/03/15 0001    Neurological Re-education Exercises   Other Exercises 1 Neuro re ed after manual therapy in supine in closed chain activity to address shoulder flexion/extension, IR/ER and incorpration of functional grasp  and release of L hand . Pt continues to slowly gain in active reach in this plane however is still limited in seated position. Pt does report significant decease in tightness in BUE's as well as decreased in spasms. Pt also reporst increased ease in using  L hand and LUE functionally.    Manual Therapy   Manual Therapy Joint mobilization;Soft tissue mobilization   Manual therapy comments Joint  and soft tissue mob to both shoulder girdles as well as L hand prior to neuro re ed to address activation into length.  Pt wih improved ability to tolerate manual therapy.                   OT Short Term Goals - 12/03/15 1705    OT SHORT TERM GOAL #1   Title Pt and son will be mod I with HEP - 12/06/2015   Baseline dependent   Status On-going   OT SHORT TERM GOAL #2   Title Pt will tolerate manual techniques from a pain standpoint to be able to address improving ROM in LUE to avoid further contracture and to reduce pain.   Baseline unable to tolerate   Status Achieved   OT SHORT TERM GOAL #3  Title Pt will be able to use L hand as stabilizer for at least 2 functional tasks.   Baseline no functional use at this time   Status Achieved           OT Long Term Goals - 12/03/15 1705    OT LONG TERM GOAL #1   Title Pt and son will be mod I with upgraded HEP - 01/03/2016   Baseline dependent   Status On-going   OT LONG TERM GOAL #2   Title Assess current splint and revise as needed   Baseline Pt did not have splint on eval however reports current splint is hard to don   Status Achieved   OT LONG TERM GOAL #3   Title Pt will demonstrate increased PROM of shoulder flexion to 50* and shoulder abduction to 65* for ease in self care activities   Baseline shoulder flexion 40, abduction 60   Status On-going   OT LONG TERM GOAL #4   Title Pt will report increased relaxation in LUE and decreased intensity in spasm of LUE to reduce pain and discmfort   Baseline Reports severe currently   Status  On-going               Plan - 12/03/15 1706    Clinical Impression Statement Pt making slow but steady progress toward goals.  Pt tolerating manual therapy better and reports carry over for relaxation and functional use of UE's.    Rehab Potential Fair   Clinical Impairments Affecting Rehab Potential severity of deficits.    OT Frequency 2x / week   OT Duration 8 weeks   OT Treatment/Interventions Self-care/ADL training;Electrical Stimulation;Moist Heat;Fluidtherapy;Ultrasound;Therapeutic exercise;Neuromuscular education;DME and/or AE instruction;Passive range of motion;Manual Therapy;Splinting;Therapeutic exercises;Therapeutic activities;Patient/family education   Plan manual therapy for both shoulders, L hand, MNT for BUE"s and truink. IF son makes PVC square instruct in HEP   Consulted and Agree with Plan of Care Patient;Family member/caregiver   Family Member Consulted son      Patient will benefit from skilled therapeutic intervention in order to improve the following deficits and impairments:  Decreased range of motion, Decreased strength, Difficulty walking, Increased edema, Impaired UE functional use, Impaired tone, Increased muscle spasms, Pain  Visit Diagnosis: Pain in joint of left hand  Stiffness of left hand joint  Stiffness of left shoulder, not elsewhere classified  Stiffness of right shoulder, not elsewhere classified  Pain in left shoulder  Muscle weakness (generalized)    Problem List Patient Active Problem List   Diagnosis Date Noted  . 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 Tobacco abuse 08/16/2015  . Peripheral neuropathy ([place]) 07/17/2015  . Cervical cord compression with myelopathy ([place]) 02/08/2015  . Frozen shoulder--left 09/05/2014  . Unintentional weight loss 06/20/2014  . Health care maintenance 03/23/2012  . Hypertension associated with diabetes ([place]) 08/17/2006  . GERD 08/17/2006  . History of diabetes mellitus, type II 08/11/2006   . Hyperlipidemia associated with type 2 diabetes mellitus ([place]) 08/11/2006  . Osteoarthritis 08/11/2006    [name], OTR/L 12/03/2015, 5:09 PM  [facility] [address] [place], [place], [address] Phone: [phone]   Fax:  [phone]  Name: Anaija Sorrenti MRN: [id number] Date of Birth: 08/03/1956

## 2015-12-06 ENCOUNTER — Encounter: Payer: Self-pay | Admitting: Occupational Therapy

## 2015-12-06 ENCOUNTER — Ambulatory Visit: Payer: Medicare Other | Admitting: Occupational Therapy

## 2015-12-06 DIAGNOSIS — M6281 Muscle weakness (generalized): Secondary | ICD-10-CM

## 2015-12-06 DIAGNOSIS — M25611 Stiffness of right shoulder, not elsewhere classified: Secondary | ICD-10-CM

## 2015-12-06 DIAGNOSIS — M25542 Pain in joints of left hand: Secondary | ICD-10-CM

## 2015-12-06 DIAGNOSIS — M25512 Pain in left shoulder: Secondary | ICD-10-CM | POA: Diagnosis not present

## 2015-12-06 DIAGNOSIS — M25642 Stiffness of left hand, not elsewhere classified: Secondary | ICD-10-CM

## 2015-12-06 DIAGNOSIS — M25612 Stiffness of left shoulder, not elsewhere classified: Secondary | ICD-10-CM

## 2015-12-06 NOTE — Therapy (Signed)
[facility] [address] [place], [place], [address] Phone: [phone]   Fax:  [phone]  Occupational Therapy Treatment  Patient Details  Name: Julie Elliott MRN: [id number] Date of Birth: May 23, 1956 Referring Provider: Dr, Alysia Penna  Encounter Date: 12/06/2015      OT End of Session - 12/06/15 1711    Visit Number 9   Number of Visits 16   Date for OT Re-Evaluation 01/03/16   Authorization Type MCR primary, MCD secondary, will need G code and PN every 10th visit   Authorization Time Period 60 days   Authorization - Visit Number 9   Authorization - Number of Visits 10   OT Start Time [id number]   OT Stop Time 1700   OT Time Calculation (min) 43 min   Activity Tolerance Patient tolerated treatment well      Past Medical History  Diagnosis Date  . Hypertension     well controlled  . DJD (degenerative joint disease)   . HLD (hyperlipidemia) 2008    stable, well controlled  . DM (diabetes mellitus) ([place]) 2008    stable HgBA1C at 6.5  . GERD (gastroesophageal reflux disease)     well controlled on Omeprazole  . Chronic headaches   . CHF (congestive heart failure) [facility])     Past Surgical History  Procedure Laterality Date  . Tubal ligation    . Carpel tunnel Right     20 yrs ago  . Cervical spine surgery  2016    There were no vitals filed for this visit.      Subjective Assessment - 12/06/15 1625    Subjective  I have no pain today   Patient is accompained by: Family member  son   Patient Stated Goals I want my hands back   Currently in Pain? No/denies                      OT Treatments/Exercises (OP) - 12/06/15 0001    Neurological Re-education Exercises   Other Exercises 1 Neuro re ed to address shoulder flexion/reach pattern first in closed chain in supine and then in sitting. Pt able to activate into movement without pain and showing imrproved movement pattern and control.   Also addressed grasp and release with reach.    Manual Therapy   Manual Therapy Joint mobilization;Soft tissue mobilization;Myofascial release   Manual therapy comments Joint, scapular and soft tissue mob to B shoulder girdles to decease tightness and increase PROM/AAROM prior to neuro re ed. Pt continues to make slow but steady gains with this.                   OT Short Term Goals - 12/06/15 1709    OT SHORT TERM GOAL #1   Title Pt and son will be mod I with HEP - 12/06/2015   Baseline dependent   Status Achieved   OT SHORT TERM GOAL #2   Title Pt will tolerate manual techniques from a pain standpoint to be able to address improving ROM in LUE to avoid further contracture and to reduce pain.   Baseline unable to tolerate   Status Achieved   OT SHORT TERM GOAL #3   Title Pt will be able to use L hand as stabilizer for at least 2 functional tasks.   Baseline no functional use at this time   Status Achieved           OT Long Term Goals -  12/06/15 1709    OT LONG TERM GOAL #1   Title Pt and son will be mod I with upgraded HEP - 01/03/2016   Baseline dependent   Status On-going   OT LONG TERM GOAL #2   Title Assess current splint and revise as needed   Baseline Pt did not have splint on eval however reports current splint is hard to don   Status Achieved   OT LONG TERM GOAL #3   Title Pt will demonstrate increased PROM of shoulder flexion to 50* and shoulder abduction to 65* for ease in self care activities   Baseline shoulder flexion 40, abduction 60   Status On-going   OT LONG TERM GOAL #4   Title Pt will report increased relaxation in LUE and decreased intensity in spasm of LUE to reduce pain and discmfort   Baseline Reports severe currently   Status On-going               Plan - 12/06/15 1709    Clinical Impression Statement Pt continues to make progress toward goals and demonstates improved functional reach and grasp  and release.    Rehab Potential Fair    Clinical Impairments Affecting Rehab Potential severity of deficits.    OT Frequency 2x / week   OT Duration 8 weeks   OT Treatment/Interventions Self-care/ADL training;Electrical Stimulation;Moist Heat;Fluidtherapy;Ultrasound;Therapeutic exercise;Neuromuscular education;DME and/or AE instruction;Passive range of motion;Manual Therapy;Splinting;Therapeutic exercises;Therapeutic activities;Patient/family education   Plan manual therapy for both shoulders, L hand, NMR for BUE"s and trunk.  Son attempted to have PVC square made and still looking for hardware store willing to assist him.    Consulted and Agree with Plan of Care Patient;Family member/caregiver   Family Member Consulted son      Patient will benefit from skilled therapeutic intervention in order to improve the following deficits and impairments:  Decreased range of motion, Decreased strength, Difficulty walking, Increased edema, Impaired UE functional use, Impaired tone, Increased muscle spasms, Pain  Visit Diagnosis: Pain in joint of left hand  Stiffness of left hand joint  Stiffness of left shoulder, not elsewhere classified  Stiffness of right shoulder, not elsewhere classified  Pain in left shoulder  Muscle weakness (generalized)    Problem List Patient Active Problem List   Diagnosis Date Noted  . 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 Tobacco abuse 08/16/2015  . Peripheral neuropathy ([place]) 07/17/2015  . Cervical cord compression with myelopathy ([place]) 02/08/2015  . Frozen shoulder--left 09/05/2014  . Unintentional weight loss 06/20/2014  . Health care maintenance 03/23/2012  . Hypertension associated with diabetes ([place]) 08/17/2006  . GERD 08/17/2006  . History of diabetes mellitus, type II 08/11/2006  . Hyperlipidemia associated with type 2 diabetes mellitus ([place]) 08/11/2006  . Osteoarthritis 08/11/2006    [name], OTR/L 12/06/2015, 5:12 PM  [facility] [address] [place], [place], [address] Phone: [phone]   Fax:  [phone]  Name: Julie Elliott MRN: [id number] Date of Birth: [birth year]

## 2015-12-07 ENCOUNTER — Encounter: Payer: Self-pay | Admitting: *Deleted

## 2015-12-11 ENCOUNTER — Encounter: Payer: Self-pay | Admitting: Occupational Therapy

## 2015-12-11 ENCOUNTER — Ambulatory Visit: Payer: Medicare Other | Admitting: Occupational Therapy

## 2015-12-11 ENCOUNTER — Encounter: Payer: Self-pay | Admitting: Physical Therapy

## 2015-12-11 ENCOUNTER — Ambulatory Visit: Payer: Medicare Other | Admitting: Physical Therapy

## 2015-12-11 DIAGNOSIS — M25512 Pain in left shoulder: Secondary | ICD-10-CM

## 2015-12-11 DIAGNOSIS — M6281 Muscle weakness (generalized): Secondary | ICD-10-CM | POA: Diagnosis not present

## 2015-12-11 DIAGNOSIS — M25612 Stiffness of left shoulder, not elsewhere classified: Secondary | ICD-10-CM

## 2015-12-11 DIAGNOSIS — M6249 Contracture of muscle, multiple sites: Secondary | ICD-10-CM

## 2015-12-11 DIAGNOSIS — M25542 Pain in joints of left hand: Secondary | ICD-10-CM

## 2015-12-11 DIAGNOSIS — R2681 Unsteadiness on feet: Secondary | ICD-10-CM

## 2015-12-11 DIAGNOSIS — M25642 Stiffness of left hand, not elsewhere classified: Secondary | ICD-10-CM

## 2015-12-11 DIAGNOSIS — M25611 Stiffness of right shoulder, not elsewhere classified: Secondary | ICD-10-CM

## 2015-12-11 DIAGNOSIS — R2689 Other abnormalities of gait and mobility: Secondary | ICD-10-CM

## 2015-12-11 NOTE — Therapy (Signed)
[facility] [address] [place], [place], [address] Phone: [phone]   Fax:  [phone]  Physical Therapy Evaluation  Patient Details  Name: Julie Elliott MRN: [id number] Date of Birth: 1956-06-24 Referring Provider: Sarina Ill, MD  Encounter Date: 12/11/2015      PT End of Session - 12/11/15 1927    Visit Number 1   Number of Visits 17  eval + 16 visits   Date for PT Re-Evaluation 02/09/16   Authorization Type Medicare Traditional primary; Medicaid secondary   Authorization Time Period G Codes and PN required every 10 visits   PT Start Time 1529   PT Stop Time 1615   PT Time Calculation (min) 46 min   Activity Tolerance Patient tolerated treatment well   Behavior During Therapy [facility] for tasks assessed/performed      Past Medical History  Diagnosis Date  . Hypertension     well controlled  . DJD (degenerative joint disease)   . HLD (hyperlipidemia) 2008    stable, well controlled  . DM (diabetes mellitus) ([place]) 2008    stable HgBA1C at 6.5  . GERD (gastroesophageal reflux disease)     well controlled on Omeprazole  . Chronic headaches   . CHF (congestive heart failure) [facility])     Past Surgical History  Procedure Laterality Date  . Tubal ligation    . Carpel tunnel Right     20 yrs ago  . Cervical spine surgery  2016    There were no vitals filed for this visit.       Subjective Assessment - 12/11/15 1536    Subjective Decline in functional mobility started in late August last year. Pt began getting weaker, had difficulty standing up from furniture, and began losing weight. Soon after found to have spinal cord compression. Underwent  ACDF C4-C7 in October 2016. Prior to August 2016, pt completely independent with mobility without assistive device. Currently, pt uses small based quad cane for all mobility and requires assistance for sit > stand from furniture.   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 No/denies            [facility] PT Assessment - 12/11/15 0001    Assessment   Medical Diagnosis spastic quadriparesis   Referring Provider Sarina Ill, MD   Onset Date/Surgical Date 03/05/15   Hand Dominance Right   Precautions   Precautions Fall   Restrictions   Weight Bearing Restrictions No   Balance Screen   Has the patient fallen in the past 6 months No   Has the patient had a decrease in activity level because of a fear of falling?  No   Is the patient reluctant to leave their home because of a fear of falling?  No   Home Ecologist residence   [place]  lives with son, who works FT   Type of [place] to enter   [employer] of Steps 4   Entrance Stairs-Rails Right   Home Layout One level   Scenic - quad  [facility]   Additional Comments Has home health aide 2-3 hours/day on weekdays   Prior Function  Level of Independence Independent   Vocation On disability   Leisure "Get up and go," per pt.   Cognition   Overall Cognitive Status Within Functional Limits for tasks assessed   Coordination   Gross Motor Movements are Fluid and Coordinated No   Heel Shin Test Quality and excursion of movement limited in BLE's; limited moreso on L than R.   Tone   Assessment Location --   ROM / Strength   AROM / PROM / Strength AROM   AROM   Overall AROM  Deficits   AROM Assessment Site Knee;Ankle   Right/Left Knee Right;Left   Right Knee Extension -26  actively and passively   Left Knee Extension -12  actively and passively   Right/Left Ankle Left   Left Ankle Dorsiflexion -2   PROM   Overall PROM   Deficits   Overall PROM Comments See above for details on B knee extension A/PROM. B hip extension limited to grossly -10-15 degrees bilaterally.    Strength   Overall Strength Deficits   Strength Assessment Site Hip;Knee;Ankle   Right/Left Hip Right;Left   Right Hip Flexion 4/5   Right Hip Extension 4-/5  within available ROM   Right Hip ABduction 3-/5   Left Hip Flexion 4-/5   Left Hip Extension 4-/5  within available ROM   Left Hip ABduction 4-/5  within available ROM   Right/Left Knee Right;Left   Right Knee Flexion 4+/5   Right Knee Extension 5/5  within available ROM   Left Knee Flexion 4/5   Left Knee Extension 5/5  within available ROM   Right/Left Ankle Right;Left   Right Ankle Dorsiflexion 4/5   Right Ankle Plantar Flexion 4/5   Left Ankle Dorsiflexion 4-/5  within available ROM   Left Ankle Plantar Flexion 4-/5   Bed Mobility   Bed Mobility Rolling Right;Rolling Left;Supine to Sit;Sit to Supine                   [facility] Adult PT Treatment/Exercise - 12/11/15 0001    Bed Mobility   Rolling Right 5: Supervision   Rolling Left 5: Supervision   Supine to Sit 6: Modified independent (Device/Increase time)   Sit to Supine 6: Modified independent (Device/Increase time)   Transfers   Transfers Sit to Stand;Stand to Sit   Sit to Stand 4: Min assist   Sit to Stand Details Tactile cues for weight shifting;Verbal cues for sequencing;Verbal cues for technique;Other (comment)  verbal/demo cueing for setup, foot placement   Five time sit to stand comments  very wide BOS with sit > stand   Stand to Sit 5: Supervision   Ambulation/Gait   Ambulation/Gait Yes   Ambulation/Gait Assistance 5: Supervision   Ambulation Distance (Feet) 200 Feet   Assistive device Small based quad cane   Gait Pattern Step-through pattern;Decreased arm swing - left;Decreased stride length;Decreased hip/knee flexion - right;Decreased hip/knee flexion - left;Decreased dorsiflexion -  left;Left foot flat;Right flexed knee in stance;Left flexed knee in stance;Decreased trunk rotation;Wide base of support  does not achieve neutal hip ext throughout gait cycle   Ambulation Surface Level;Indoor   Gait velocity 0.86 ft/sec   Standardized Balance Assessment   Standardized Balance Assessment Timed Up and Go Test   Timed Up and Go Test   TUG Normal TUG   Normal TUG (seconds) 38.57  using SBQC                PT Education - 12/11/15 1915  Education provided Yes   Education Details PT eval findings, goals, and POC.   Person(s) Educated Patient   Methods Explanation   Comprehension Verbalized understanding          PT Short Term Goals - 12/11/15 1929    PT SHORT TERM GOAL #1   Title Pt will perform initial home stretching program with mod I using paper handout to indicate safe compliance with HEP.  (Target date: 01/08/16)   PT SHORT TERM GOAL #2   Title Pt will improve active L ankle dorsiflexion from -2 degrees to >/= 3 degrees to facilitate consistent L foot clearance during gait.   PT SHORT TERM GOAL #3   Title Pt will improve R knee extension AROM from -26 degrees to -20 degrees to progress toward more normalized gait pattern.    PT SHORT TERM GOAL #4   Title Pt will improve L knee AROM from -12 degrees to -7 degrees to progress toward more normalized gait pattern.    PT SHORT TERM GOAL #5   Title Pt will consistently perform sit <> stand from standard chair with mod I, normal BOS, and symmetrical LE placement.   Additional Short Term Goals   Additional Short Term Goals Yes   PT SHORT TERM GOAL #6   Title Pt will negotiate 4 stairs with R rail with mod I using LRAD to indicate pt safety using primary home entrance.    PT SHORT TERM GOAL #7   Title Pt will negotiate standard ramp and curb step with supervision using LRAD to progress toward independence with community obstacle negotiation.            PT Long Term Goals - 12/11/15 1939    PT LONG TERM  GOAL #1   Title Pt will independently demo HEP to maximize functional gains made in PT.  (Target date: 02/05/16)   PT LONG TERM GOAL #2   Title Pt will improve R knee extension AROM from -26 degrees to -15 degrees to promote more normalized gait pattern, increase energy/time efficiency of mobility.    PT LONG TERM GOAL #3   Title Pt will improve L knee extension AROM from -12 degrees to -2 degrees to promote more normalized gait pattern, increase energy/time efficiency of mobility.    PT LONG TERM GOAL #4   Title Pt will improve gait velocity from 0.86 ft/sec to >/= 1.29 ft/sec to indicate significantly increased efficiency of ambulation.   PT LONG TERM GOAL #5   Title Pt will improve TUG time from 38.57 seconds to </= 28.17 seconds to indicate significant improvement in efficiency of mobility.    Additional Long Term Goals   Additional Long Term Goals Yes   PT LONG TERM GOAL #6   Title Pt will negotiate standard ramp and curb step with mod I using LRAD to indicate increased stability/independence with community obstacle negotiation.    PT LONG TERM GOAL #7   Title Pt will ambulate >300' over level and unlevel indoor and (paved) outdoor surfaces with mod I using LRAD to indicate increased safety with community mobility.                Plan - 12/11/15 1956    Clinical Impression Statement Pt is a 60 y/o F referred to outpatient PT to address functional impairments associated with spastic quadriparesis. PMH significant for: HTN, HLD, DM II, GERD, CHF, DJD, chronic headaches, peripheral neuropathy, severe cervical stenosis with myelopathy and s/p ACDF C4-C7 (05/2015). PT evaluation reveals  the following impairments: multiple joint contractures limiting B hip/knee extension and L ankle dorsiflexion; significant BLE weakness, gait impairments; gait velocity and TUG suggestive of risk of recurrent falls; impaired standing balance; and decreased stability and independence with all aspects of  functional mobility. Pt will benefit from skilled outpatient PT 2x/week for 8 weeks to address said impairments.   Rehab Potential Good   Clinical Impairments Affecting Rehab Potential Positive: intact cognition, pt motivation, h/o compliance, age, and family support. Negative: multiple contractures   PT Frequency 2x / week   PT Duration 8 weeks   PT Treatment/Interventions Orthotic Fit/Training;ADLs/Self Care Home Management;Gait training;Stair training;Functional mobility training;Therapeutic activities;Moist Heat;Electrical Stimulation;Therapeutic exercise;Balance training;Patient/family education;Neuromuscular re-education;Passive range of motion;Manual techniques   PT Next Visit Plan Provide HEP for stretching. Begin stretching and manual therapy at B hips, knees, and L ankle. Further educate on PT goals. Assess negotiate of stairs x4 with R rail.   Consulted and Agree with Plan of Care Patient      Patient will benefit from skilled therapeutic intervention in order to improve the following deficits and impairments:  Abnormal gait, Decreased knowledge of use of DME, Decreased balance, Decreased range of motion, Impaired flexibility, Postural dysfunction, Impaired tone, Decreased strength, Impaired UE functional use, Decreased coordination  Visit Diagnosis: Other abnormalities of gait and mobility - Plan: PT plan of care cert/re-cert  Contracture of muscle, multiple sites - Plan: PT plan of care cert/re-cert  Muscle weakness (generalized) - Plan: PT plan of care cert/re-cert  Unsteadiness on feet - Plan: PT plan of care cert/re-cert      G-Codes - [id number] 2008    Functional Assessment Tool Used TUG: 38.52 seconds with SBQC; gait velocity = 0.86 ft/sec   Functional Limitation Mobility: Walking and moving around   Mobility: Walking and Moving Around Current Status [phone]) At least 60 percent but less than 80 percent impaired, limited or restricted   Mobility: Walking and Moving Around  Goal Status [phone]) At least 40 percent but less than 60 percent impaired, limited or restricted       Problem List Patient Active Problem List   Diagnosis Date Noted  . 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 Tobacco abuse 08/16/2015  . Peripheral neuropathy ([place]) 07/17/2015  . Cervical cord compression with myelopathy ([place]) 02/08/2015  . Frozen shoulder--left 09/05/2014  . Unintentional weight loss 06/20/2014  . Health care maintenance 03/23/2012  . Hypertension associated with diabetes ([place]) 08/17/2006  . GERD 08/17/2006  . History of diabetes mellitus, type II 08/11/2006  . Hyperlipidemia associated with type 2 diabetes mellitus ([place]) 08/11/2006  . Osteoarthritis 08/11/2006    Billie Ruddy, PT, DPT [facility] [address] [place], [place], [address] Phone: [phone]   Fax:  [phone] 12/11/2015, 8:11 PM  Name: Larrisa Prechtel MRN: [id number] Date of Birth: [birth year]

## 2015-12-11 NOTE — Therapy (Signed)
[facility] [address] [place], [place], [address] Phone: [phone]   Fax:  [phone]  Occupational Therapy Treatment  Patient Details  Name: Julie Elliott MRN: [id number] Date of Birth: May 18, 1956 Referring Provider: Dr, Alysia Penna  Encounter Date: 12/11/2015    Past Medical History  Diagnosis Date  . Hypertension     well controlled  . DJD (degenerative joint disease)   . HLD (hyperlipidemia) 2008    stable, well controlled  . DM (diabetes mellitus) ([place]) 2008    stable HgBA1C at 6.5  . GERD (gastroesophageal reflux disease)     well controlled on Omeprazole  . Chronic headaches   . CHF (congestive heart failure) [facility])     Past Surgical History  Procedure Laterality Date  . Tubal ligation    . Carpel tunnel Right     20 yrs ago  . Cervical spine surgery  2016    There were no vitals filed for this visit.      Subjective Assessment - 12/11/15 1627    Subjective  I had my PT eval today   Pertinent History see epic   Patient Stated Goals I want my hands back   Currently in Pain? No/denies                      OT Treatments/Exercises (OP) - 12/11/15 1742    Neurological Re-education Exercises   Other Exercises 1 Neuro re ed to address functional grasp and release with L hand. Pt is able to use LUE as gross assist with some activities at this point.   Ultrasound   Ultrasound Location L palm   Ultrasound Parameters 20%, 3.3 HmZ, .08w/cm, x 8 minutes   Ultrasound Goals Other (Comment)  reduce tightness, swelling    Manual Therapy   Manual Therapy Joint mobilization;Soft tissue mobilization   Manual therapy comments joint mob and soft tissue mob following ultrasound to L hand to decrease tightness, increase PROM prior neuro re ed for grasp and release.                   OT Short Term Goals - 12/06/15 1709    OT SHORT TERM GOAL #1   Title Pt and son will be mod I  with HEP - 12/06/2015   Baseline dependent   Status Achieved   OT SHORT TERM GOAL #2   Title Pt will tolerate manual techniques from a pain standpoint to be able to address improving ROM in LUE to avoid further contracture and to reduce pain.   Baseline unable to tolerate   Status Achieved   OT SHORT TERM GOAL #3   Title Pt will be able to use L hand as stabilizer for at least 2 functional tasks.   Baseline no functional use at this time   Status Achieved           OT Long Term Goals - 12/06/15 1709    OT LONG TERM GOAL #1   Title Pt and son will be mod I with upgraded HEP - 01/03/2016   Baseline dependent   Status On-going   OT LONG TERM GOAL #2   Title Assess current splint and revise as needed   Baseline Pt did not have splint on eval however reports current splint is hard to don   Status Achieved   OT LONG TERM GOAL #3   Title Pt will demonstrate increased PROM of shoulder flexion to 50* and shoulder  abduction to 65* for ease in self care activities   Baseline shoulder flexion 40, abduction 60   Status On-going   OT LONG TERM GOAL #4   Title Pt will report increased relaxation in LUE and decreased intensity in spasm of LUE to reduce pain and discmfort   Baseline Reports severe currently   Status On-going             Patient will benefit from skilled therapeutic intervention in order to improve the following deficits and impairments:     Visit Diagnosis: Pain in joint of left hand  Stiffness of left hand joint  Stiffness of left shoulder, not elsewhere classified  Stiffness of right shoulder, not elsewhere classified  Pain in left shoulder  Muscle weakness (generalized)    Problem List Patient Active Problem List   Diagnosis Date Noted  . 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 Tobacco abuse 08/16/2015  . Peripheral neuropathy ([place]) 07/17/2015  . Cervical cord compression with myelopathy ([place]) 02/08/2015  . Frozen shoulder--left 09/05/2014  .  Unintentional weight loss 06/20/2014  . Health care maintenance 03/23/2012  . Hypertension associated with diabetes ([place]) 08/17/2006  . GERD 08/17/2006  . History of diabetes mellitus, type II 08/11/2006  . Hyperlipidemia associated with type 2 diabetes mellitus ([place]) 08/11/2006  . Osteoarthritis 08/11/2006    [name], OTR/L 12/11/2015, 5:46 PM  [facility] [address] [place], [place], [address] Phone: [phone]   Fax:  [phone]  Name: Deirdre Riggs MRN: [id number] Date of Birth: [birth year]

## 2015-12-13 ENCOUNTER — Ambulatory Visit: Payer: Medicare Other | Admitting: Occupational Therapy

## 2015-12-13 ENCOUNTER — Encounter: Payer: Self-pay | Admitting: Occupational Therapy

## 2015-12-13 DIAGNOSIS — M25642 Stiffness of left hand, not elsewhere classified: Secondary | ICD-10-CM | POA: Diagnosis not present

## 2015-12-13 DIAGNOSIS — M6281 Muscle weakness (generalized): Secondary | ICD-10-CM | POA: Diagnosis not present

## 2015-12-13 DIAGNOSIS — M25512 Pain in left shoulder: Secondary | ICD-10-CM | POA: Diagnosis not present

## 2015-12-13 DIAGNOSIS — M25612 Stiffness of left shoulder, not elsewhere classified: Secondary | ICD-10-CM

## 2015-12-13 DIAGNOSIS — M25641 Stiffness of right hand, not elsewhere classified: Secondary | ICD-10-CM

## 2015-12-13 DIAGNOSIS — M25611 Stiffness of right shoulder, not elsewhere classified: Secondary | ICD-10-CM

## 2015-12-13 DIAGNOSIS — M25542 Pain in joints of left hand: Secondary | ICD-10-CM

## 2015-12-13 NOTE — Therapy (Signed)
[facility] [address] [place], [place], [address] Phone: [phone]   Fax:  [phone]  Occupational Therapy Treatment  Patient Details  Name: Julie Elliott MRN: [id number] Date of Birth: 05/26/1956 Referring Provider: Dr, Alysia Penna  Encounter Date: 12/13/2015      OT End of Session - 12/13/15 1519    Visit Number 10   Number of Visits 16   Date for OT Re-Evaluation 01/03/16   Authorization Type MCR primary, MCD secondary, will need G code and PN every 10th visit   Authorization Time Period 60 days   Authorization - Visit Number 10   Authorization - Number of Visits 10   OT Start Time [id number]   OT Stop Time 1358   OT Time Calculation (min) 43 min   Activity Tolerance Patient tolerated treatment well      Past Medical History  Diagnosis Date  . Hypertension     well controlled  . DJD (degenerative joint disease)   . HLD (hyperlipidemia) 2008    stable, well controlled  . DM (diabetes mellitus) ([place]) 2008    stable HgBA1C at 6.5  . GERD (gastroesophageal reflux disease)     well controlled on Omeprazole  . Chronic headaches   . CHF (congestive heart failure) [facility])     Past Surgical History  Procedure Laterality Date  . Tubal ligation    . Carpel tunnel Right     20 yrs ago  . Cervical spine surgery  2016    There were no vitals filed for this visit.      Subjective Assessment - 12/13/15 1326    Subjective  My shoulder feel great!   Patient is accompained by: Family member  son   Pertinent History see epic   Patient Stated Goals I want my hands back   Currently in Pain? No/denies                      OT Treatments/Exercises (OP) - 12/13/15 0001    Neurological Re-education Exercises   Other Exercises 1 developed HEP for pt for UE's to faciliitate bilateral and unilateral reach  with closed chain and open chain activities.  Pt able to return demonstrate all activiities and  also provided in writing. See pt instruction section for details. Pt with significant improvement in AROM of BUE's and now has approximately 80* of shoulder flexion in LUE in supine and 60* in sitting. Pt now able to also use LUE as gross assist for some activities.                 OT Education - 12/13/15 1513    Education provided Yes   Education Details HEP for [employer]) Educated Patient   Methods Explanation;Demonstration;Verbal cues;Handout   Comprehension Verbalized understanding;Returned demonstration          OT Short Term Goals - 12/13/15 1513    OT SHORT TERM GOAL #1   Title Pt and son will be mod I with HEP - 12/06/2015   Baseline dependent   Status Achieved   OT SHORT TERM GOAL #2   Title Pt will tolerate manual techniques from a pain standpoint to be able to address improving ROM in LUE to avoid further contracture and to reduce pain.   Baseline unable to tolerate   Status Achieved   OT SHORT TERM GOAL #3   Title Pt will be able to use L hand as stabilizer for at  least 2 functional tasks.   Baseline no functional use at this time   Status Achieved           OT Long Term Goals - Dec 27, 2015 1513    OT LONG TERM GOAL #1   Title Pt and son will be mod I with upgraded HEP - 01/03/2016   Baseline dependent   Status On-going   OT LONG TERM GOAL #2   Title Assess current splint and revise as needed   Baseline Pt did not have splint on eval however reports current splint is hard to don   Status Achieved   OT LONG TERM GOAL #3   Title Pt will demonstrate increased PROM of shoulder flexion to 50* and shoulder abduction to 65* for ease in self care activities   Baseline shoulder flexion 40, abduction 60   Status Achieved   OT LONG TERM GOAL #4   Title Pt will report increased relaxation in LUE and decreased intensity in spasm of LUE to reduce pain and discmfort   Baseline Reports severe currently   Status Achieved               Plan - 12-27-2015  1521    Clinical Impairments Affecting Rehab Potential severity of deficits.       Patient will benefit from skilled therapeutic intervention in order to improve the following deficits and impairments:  Decreased range of motion, Decreased strength, Difficulty walking, Increased edema, Impaired UE functional use, Impaired tone, Increased muscle spasms, Pain  Visit Diagnosis: Muscle weakness (generalized)  Pain in joint of left hand  Stiffness of left hand joint  Stiffness of left shoulder, not elsewhere classified  Stiffness of right shoulder, not elsewhere classified  Pain in left shoulder  Stiffness of right hand, not elsewhere classified      G-Codes - 12-27-2015 1527    Functional Assessment Tool Used skilled clinical observation   Functional Limitation Self care   Self Care Current Status [id number]) At least 80 percent but less than 100 percent impaired, limited or restricted   Self Care Goal Status [id number]) At least 60 percent but less than 80 percent impaired, limited or restricted      Problem List Patient Active Problem List   Diagnosis Date Noted  . 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 Tobacco abuse 08/16/2015  . Peripheral neuropathy ([place]) 07/17/2015  . Cervical cord compression with myelopathy ([place]) 02/08/2015  . Frozen shoulder--left 09/05/2014  . Unintentional weight loss 06/20/2014  . Health care maintenance 03/23/2012  . Hypertension associated with diabetes ([place]) 08/17/2006  . GERD 08/17/2006  . History of diabetes mellitus, type II 08/11/2006  . Hyperlipidemia associated with type 2 diabetes mellitus ([place]) 08/11/2006  . Osteoarthritis 08/11/2006   Occupational Therapy Progress Note  Dates of Reporting Period: 11/08/2015 to 12/27/15  Objective Reports of Subjective Statement: see above  Objective Measurements: see above  Goal Update: see above  Plan: see above  Reason Skilled Services are Required: see above  [name],  OTR/L 2015/12/27, 3:29 PM  [facility] [address] [place], [place], [address] Phone: [phone]   Fax:  [phone]  Name: Julie Elliott MRN: [id number] Date of Birth: Dec 24, 1955

## 2015-12-13 NOTE — Patient Instructions (Signed)
Home Program for Arms:  Do every day 1-2 times per day. Always work within your pain limits and make sure you rest between each set.    1. Lay on your back. Hold the PVC square in your hands, palms facing each other.  Start with square on your chest. Raise toward the ceiling and then return to chest. Do 15, rest then do 15 more.  2. Lay on your back and hold square.  Raise square toward the ceiling then raise it over your head as far you can within your pain limits.  Then reach square toward your knees. Do 15, rest then do 15 more.    3.  Lay on your back and hold square. Raise toward the ceiling and then go left to right. Do 15, rest then do 15 more.    4. Sit on a firm surface.  Reach for the floor with both arms.  Let your head and your arms relax into the stretch.  HOLD for a slow count of 5 then sit back up.  Do 10, rest then do 10 more.  You can also use this as tone reduction technique during the day if your arms feel tight.   4.  Complete table slides.  Do 5, rest then do 10 more.

## 2015-12-14 ENCOUNTER — Encounter: Payer: Self-pay | Admitting: Physical Medicine & Rehabilitation

## 2015-12-14 ENCOUNTER — Ambulatory Visit: Payer: Medicare Other | Admitting: Physical Therapy

## 2015-12-14 ENCOUNTER — Encounter: Payer: Medicare Other | Attending: Physical Medicine & Rehabilitation

## 2015-12-14 ENCOUNTER — Ambulatory Visit (HOSPITAL_BASED_OUTPATIENT_CLINIC_OR_DEPARTMENT_OTHER): Payer: Medicare Other | Admitting: Physical Medicine & Rehabilitation

## 2015-12-14 VITALS — BP 118/69 | HR 93 | Resp 14

## 2015-12-14 DIAGNOSIS — G8250 Quadriplegia, unspecified: Secondary | ICD-10-CM | POA: Diagnosis not present

## 2015-12-14 DIAGNOSIS — Z9889 Other specified postprocedural states: Secondary | ICD-10-CM | POA: Diagnosis not present

## 2015-12-14 DIAGNOSIS — K219 Gastro-esophageal reflux disease without esophagitis: Secondary | ICD-10-CM | POA: Insufficient documentation

## 2015-12-14 DIAGNOSIS — M25512 Pain in left shoulder: Secondary | ICD-10-CM | POA: Diagnosis not present

## 2015-12-14 DIAGNOSIS — R51 Headache: Secondary | ICD-10-CM | POA: Diagnosis not present

## 2015-12-14 DIAGNOSIS — M25642 Stiffness of left hand, not elsewhere classified: Secondary | ICD-10-CM | POA: Diagnosis not present

## 2015-12-14 DIAGNOSIS — I509 Heart failure, unspecified: Secondary | ICD-10-CM | POA: Insufficient documentation

## 2015-12-14 DIAGNOSIS — E785 Hyperlipidemia, unspecified: Secondary | ICD-10-CM | POA: Diagnosis not present

## 2015-12-14 DIAGNOSIS — F17210 Nicotine dependence, cigarettes, uncomplicated: Secondary | ICD-10-CM | POA: Insufficient documentation

## 2015-12-14 DIAGNOSIS — M1990 Unspecified osteoarthritis, unspecified site: Secondary | ICD-10-CM | POA: Diagnosis not present

## 2015-12-14 DIAGNOSIS — M6281 Muscle weakness (generalized): Secondary | ICD-10-CM | POA: Diagnosis not present

## 2015-12-14 DIAGNOSIS — E119 Type 2 diabetes mellitus without complications: Secondary | ICD-10-CM | POA: Diagnosis not present

## 2015-12-14 DIAGNOSIS — M6249 Contracture of muscle, multiple sites: Secondary | ICD-10-CM

## 2015-12-14 DIAGNOSIS — I10 Essential (primary) hypertension: Secondary | ICD-10-CM | POA: Diagnosis not present

## 2015-12-14 DIAGNOSIS — R2689 Other abnormalities of gait and mobility: Secondary | ICD-10-CM

## 2015-12-14 DIAGNOSIS — M25612 Stiffness of left shoulder, not elsewhere classified: Secondary | ICD-10-CM | POA: Diagnosis not present

## 2015-12-14 DIAGNOSIS — M25611 Stiffness of right shoulder, not elsewhere classified: Secondary | ICD-10-CM | POA: Diagnosis not present

## 2015-12-14 DIAGNOSIS — M25542 Pain in joints of left hand: Secondary | ICD-10-CM | POA: Diagnosis not present

## 2015-12-14 NOTE — Patient Instructions (Signed)
Continue your current medication dosages Lyrica 150 mg at night Tizanidine 2mg  every morning  Repeat Botox  approximate 6 weeks

## 2015-12-14 NOTE — Patient Instructions (Signed)
Hamstring Stretch, Seated (Strap, Two Chairs)    Sit with one leg extended onto facing chair.  1. Try to get knee as straight as possible (toes and knee straight - not pointing inward/outward); then hinged forward at waist to feel a stretch in the back of your leg. Hold for __60__ seconds, __2-3__ times per day on each leg.  2. Loop strap/belt over outstretched foot (as pictured). Keeping foot aligned straight, pull belt to feel gentle stretch in back of lower leg. Hold for 60 seconds, 2-3 times per day on each leg.  Hip Flexor Stretch    Lying on back near edge of (low) couch, bend one leg, foot flat. Loop belt around foot closest to edge, then hang leg over edge. Pull strap to feel stretch in front of thigh. Hold for 60 seconds, 2-3 times per day on each leg.

## 2015-12-14 NOTE — Progress Notes (Signed)
Subjective:    Patient ID: Julie Elliott, female    DOB: Dec 30, 1955, 60 y.o.   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 She tried taking 2 mg 3 times a day however that caused excessive drowsiness. He gets good relief from Lyrica 150 mg at night Pain Inventory Average Pain 0 Pain Right Now 0 My pain is tingling  In the last 24 hours, has pain interfered with the following? General activity 0 Relation with others 0 Enjoyment of life 0 What TIME of day is your pain at its worst? NA Sleep (in general) Good  Pain is worse with: some activites Pain improves with: rest Relief from Meds: 0  Mobility use a cane how many minutes can you walk? 12 ability to climb steps?  yes do you drive?  no use a wheelchair needs help with transfers Do you have any goals in this area?  yes  Function disabled: date disabled NA I need assistance with the following:  feeding, dressing, bathing, toileting, meal prep, household duties and shopping  Neuro/Psych weakness numbness tremor tingling trouble walking spasms  Prior Studies Any changes since last visit?  no  Physicians involved in your care Any changes since last visit?  no   Family History  Problem Relation Age of Onset  . Diabetes Mother   . Heart disease Mother   . Prostate cancer Father   . Neuropathy Neg Hx    Social History   Social History  . Marital Status: Single    Spouse Name: N/A  . Number of Children: 3  . Years of Education: 12   Occupational History  . Unemployed    Social History Main Topics  . Smoking status: Current Some Day Smoker -- 0.30 packs/day   Types: Cigarettes    Last Attempt to Quit: 04/18/2013  . Smokeless tobacco: None  . Alcohol Use: No  . Drug Use: No  . Sexual Activity: Not Asked   Other Topics Concern  . None   Social History Narrative   to get supplies nfrom [facility] per pt request.1--[phone]   Lives at home with son.    Right handed.   Caffeine use: drinks coffee-rare   Past Surgical History  Procedure Laterality Date  . Tubal ligation    . Carpel tunnel Right     20 yrs ago  . Cervical spine surgery  2016   Past Medical History  Diagnosis Date  . Hypertension     well controlled  . DJD (degenerative joint disease)   . HLD (hyperlipidemia) 2008    stable, well controlled  . DM (diabetes mellitus) ([place]) 2008    stable HgBA1C at 6.5  . GERD (gastroesophageal reflux disease)     well controlled on Omeprazole  . Chronic headaches   . CHF (congestive heart failure) ([facility])    BP 118/69 mmHg  Pulse 93  Resp 14  SpO2 99%  Opioid Risk Score:   Fall Risk Score:  `1  Depression screen PHQ 2/9  Depression screen [facility] 2/9 11/06/2015 10/19/2015 08/14/2015 02/27/2015 10/17/2014 09/12/2014 06/20/2014  Decreased Interest 0 1 0 0 0 0 0  Down, Depressed, Hopeless 0 0 0  0 0 0 0  PHQ - 2 Score 0 1 0 0 0 0 0  Altered sleeping - 0 - - - - -  Tired, decreased energy - 0 - - - - -  Change in appetite - 0 - - - - -  Feeling bad or failure about yourself  - 0 - - - - -  Trouble concentrating - 0 - - - - -  Moving slowly or fidgety/restless - 0 - - - - -  Suicidal thoughts - 0 - - - - -  PHQ-9 Score - 1 - - - - -  Difficult doing work/chores - Not difficult at all - - - - -     Review of Systems  Constitutional: Positive for unexpected weight change.  Musculoskeletal: Positive for gait problem.  Neurological: Positive for tremors, weakness and numbness.       Tingling  Spasms   All other systems reviewed and are negative.      Objective:   Physical Exam Motor strength is 3 minus at the left deltoid biceps  triceps 2 minus finger flexors and extensors 4 minus at the right deltoid, bicep, tricep 3 minus finger flexors and extensors Right lower extremity 4 minus at the hip flexor and knee extensor and ankle dorsiflexor Left lower extremity 3 minus at the hip flexor and extensor and dorsiflexor  Tone Ashworth grade 3 at the left pectoralis, Ashworth 2 at the wrist flexors on the left, Ashworth grade testing limited at the finger flexors due to finger contracture Pronator left Ashworth 0  Mood and affect are appropriate  Mild swelling of the left hand no tenderness palpation there is contracture at the MCP PIP and DIP joints of the left hand No hypersensitivity to touch      Assessment & Plan:  1.Cervical myelopathy with spastic quadriplegia. Improvement in left upper extremity spasticity After Botox injection 11/02/2015. Patient would benefit from additional units  In the left pectoralis muscle. Repeat treatment in 6 weeks as it will be wearing off around that time. Next treatment dosages as follows  FDS 50 FDP 50 FCU 25 FCR 50 PT 25 Pect 200  Continue tizanidine, now taking 2 mg every morning Continue Lyrica 150 mg daily at bedtime

## 2015-12-14 NOTE — Therapy (Signed)
[facility] [address] [place], [place], [address] Phone: [phone]   Fax:  [phone]  Physical Therapy Treatment  Patient Details  Name: Julie Elliott MRN: [id number] Date of Birth: Oct 29, 1955 Referring Provider: Sarina Ill, MD  Encounter Date: 12/14/2015      PT End of Session - 12/14/15 1112    Visit Number 2   Number of Visits 17   Date for PT Re-Evaluation 02/09/16   Authorization Type Medicare Traditional primary; Medicaid secondary   Authorization Time Period G Codes and PN required every 10 visits   PT Start Time 1102   PT Stop Time 1148   PT Time Calculation (min) 46 min   Activity Tolerance Patient tolerated treatment well   Behavior During Therapy [facility] for tasks assessed/performed      Past Medical History  Diagnosis Date  . Hypertension     well controlled  . DJD (degenerative joint disease)   . HLD (hyperlipidemia) 2008    stable, well controlled  . DM (diabetes mellitus) ([place]) 2008    stable HgBA1C at 6.5  . GERD (gastroesophageal reflux disease)     well controlled on Omeprazole  . Chronic headaches   . CHF (congestive heart failure) [facility])     Past Surgical History  Procedure Laterality Date  . Tubal ligation    . Carpel tunnel Right     20 yrs ago  . Cervical spine surgery  2016    There were no vitals filed for this visit.      Subjective Assessment - 12/14/15 1105    Subjective Pt states no pain today, L side stiff pain with laying on stomach    Patient is accompained by: Family member   Pertinent History PMH significant for: HTN, HLD, DM II, GERD, CHF, DJD, chronic headaches, peripheral neuropathy, spastic quadriparesis from severe cervical stenosis with myelopathy and s/p ACDF C4-C7 (05/2015)   Patient Stated Goals "To get my mobility and stability back, and to get energized again."   Currently in Pain? No/denies                         [facility] Adult PT  Treatment/Exercise - 12/14/15 0001    Transfers   Transfers Sit to Stand;Stand to Sit   Sit to Stand 5: Supervision   Five time sit to stand comments  continues to demo very wide BOS with sit > stand from low mat table; LE placement more symmetrical, as compared with on PT evaluation   Stand to Sit 5: Supervision   Ambulation/Gait   Ambulation/Gait Yes   Ambulation/Gait Assistance 5: Supervision   Ambulation/Gait Assistance Details Gait 2 x120' with [facility] with (S), x130 with R forearm crutch with (S), noted improvement in LUE hypertonia (less prominent extensor synergy during gait) as compared with SBQC. Trialed L Reaction AFO x100' for DF assist; noted iincreased L ankle DF and improved LLE clearance, but increased LUE flexor tone and pt-reported discomfort (pt perceived brace felt "unlevel", as though brace elevated at heel).   Ambulation Distance (Feet) 470 Feet  total   Assistive device Small based quad cane;R Forearm Crutch;Other (Comment)  L Reaction AFO for 1 trial   Gait Pattern Step-through pattern;Decreased arm swing - left;Decreased stride length;Decreased hip/knee flexion - right;Decreased hip/knee flexion - left;Decreased dorsiflexion - left;Left foot flat;Right flexed knee in stance;Left flexed knee in stance;Decreased trunk rotation;Wide base of support  does not achieve neutal hip ext throughout gait  cycle   Ambulation Surface Level;Indoor   Stairs Yes   Stairs Assistance 4: Min guard   Stairs Assistance Details (indicate cue type and reason) Ascends forward-facing with RUE on R rail, min guard for stability/balance (very UE dependent); descends laterally with RUE on R rail.   Stair Management Technique One rail Right;Forwards;Sideways;Step to pattern   Number of Stairs 4   Height of Stairs 6   Exercises   Exercises Other Exercises   Other Exercises  With PT verbal instruction and demo cueing, pt effectively performed B hamstring self-stretch 2 x60-sec holds per side, B ankle  PF stretches using belt 2 x60-sec holds per side, and supine B hip flexor self-stretch x60-sec holds per side with belt for increased knee flexion.  increased time required for transitional movements                PT Education - 12/14/15 1111    Education provided Yes   Education Details HEP: LE stretching program initiated. See Pt Instructions for details.   Person(s) Educated Patient   Methods Explanation;Demonstration;Tactile cues;Handout;Verbal cues   Comprehension Returned demonstration;Verbalized understanding          PT Short Term Goals - 12/11/15 1929    PT SHORT TERM GOAL #1   Title Pt will perform initial home stretching program with mod I using paper handout to indicate safe compliance with HEP.  (Target date: 01/08/16)   PT SHORT TERM GOAL #2   Title Pt will improve active L ankle dorsiflexion from -2 degrees to >/= 3 degrees to facilitate consistent L foot clearance during gait.   PT SHORT TERM GOAL #3   Title Pt will improve R knee extension AROM from -26 degrees to -20 degrees to progress toward more normalized gait pattern.    PT SHORT TERM GOAL #4   Title Pt will improve L knee AROM from -12 degrees to -7 degrees to progress toward more normalized gait pattern.    PT SHORT TERM GOAL #5   Title Pt will consistently perform sit <> stand from standard chair with mod I, normal BOS, and symmetrical LE placement.   Additional Short Term Goals   Additional Short Term Goals Yes   PT SHORT TERM GOAL #6   Title Pt will negotiate 4 stairs with R rail with mod I using LRAD to indicate pt safety using primary home entrance.    PT SHORT TERM GOAL #7   Title Pt will negotiate standard ramp and curb step with supervision using LRAD to progress toward independence with community obstacle negotiation.            PT Long Term Goals - 12/11/15 1939    PT LONG TERM GOAL #1   Title Pt will independently demo HEP to maximize functional gains made in PT.  (Target date:  02/05/16)   PT LONG TERM GOAL #2   Title Pt will improve R knee extension AROM from -26 degrees to -15 degrees to promote more normalized gait pattern, increase energy/time efficiency of mobility.    PT LONG TERM GOAL #3   Title Pt will improve L knee extension AROM from -12 degrees to -2 degrees to promote more normalized gait pattern, increase energy/time efficiency of mobility.    PT LONG TERM GOAL #4   Title Pt will improve gait velocity from 0.86 ft/sec to >/= 1.29 ft/sec to indicate significantly increased efficiency of ambulation.   PT LONG TERM GOAL #5   Title Pt will improve TUG time  from 38.57 seconds to </= 28.17 seconds to indicate significant improvement in efficiency of mobility.    Additional Long Term Goals   Additional Long Term Goals Yes   PT LONG TERM GOAL #6   Title Pt will negotiate standard ramp and curb step with mod I using LRAD to indicate increased stability/independence with community obstacle negotiation.    PT LONG TERM GOAL #7   Title Pt will ambulate >300' over level and unlevel indoor and (paved) outdoor surfaces with mod I using LRAD to indicate increased safety with community mobility.                Plan - 12/14/15 1205    Clinical Impression Statement Session focused initiating of LE stretching program for management of hypertonia, gait training with varying assistive devices to normalize weight shift, address tone. LUE appeared less hypertonic with use of R forearm crutch; will continue to assess. L Reaction AFO effective in increasing ankle DF assist but uncomfortable for pt.    Rehab Potential Good   Clinical Impairments Affecting Rehab Potential Positive: intact cognition, pt motivation, h/o compliance, age, and family support. Negative: multiple contractures   PT Frequency 2x / week   PT Duration 8 weeks   PT Treatment/Interventions Orthotic Fit/Training;ADLs/Self Care Home Management;Gait training;Stair training;Functional mobility  training;Therapeutic activities;Moist Heat;Electrical Stimulation;Therapeutic exercise;Balance training;Patient/family education;Neuromuscular re-education;Passive range of motion;Manual techniques   PT Next Visit Plan Assess stretching HEP. Continue to address contractures in B hip flexors, B hamstrings, L gastroc. Assess gait with different L AFO's (consider revisiting L Reaction using supination control strap).   Consulted and Agree with Plan of Care Patient      Patient will benefit from skilled therapeutic intervention in order to improve the following deficits and impairments:  Abnormal gait, Decreased knowledge of use of DME, Decreased balance, Decreased range of motion, Impaired flexibility, Postural dysfunction, Impaired tone, Decreased strength, Impaired UE functional use, Decreased coordination  Visit Diagnosis: Other abnormalities of gait and mobility  Contracture of muscle, multiple sites     Problem List Patient Active Problem List   Diagnosis Date Noted  . 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 Tobacco abuse 08/16/2015  . Peripheral neuropathy ([place]) 07/17/2015  . Cervical cord compression with myelopathy ([place]) 02/08/2015  . Frozen shoulder--left 09/05/2014  . Unintentional weight loss 06/20/2014  . Health care maintenance 03/23/2012  . Hypertension associated with diabetes ([place]) 08/17/2006  . GERD 08/17/2006  . History of diabetes mellitus, type II 08/11/2006  . Hyperlipidemia associated with type 2 diabetes mellitus ([place]) 08/11/2006  . Osteoarthritis 08/11/2006   Billie Ruddy, PT, DPT [facility] [address] [place], [place], [address] Phone: [phone]   Fax:  [phone] 12/14/2015, 12:14 PM  Name: Julie Elliott MRN: [id number] Date of Birth: 1956-03-20

## 2015-12-17 ENCOUNTER — Encounter: Payer: Medicare Other | Admitting: Occupational Therapy

## 2015-12-19 ENCOUNTER — Ambulatory Visit: Payer: Medicare Other | Admitting: Physical Therapy

## 2015-12-19 DIAGNOSIS — M6281 Muscle weakness (generalized): Secondary | ICD-10-CM

## 2015-12-19 DIAGNOSIS — M25612 Stiffness of left shoulder, not elsewhere classified: Secondary | ICD-10-CM | POA: Diagnosis not present

## 2015-12-19 DIAGNOSIS — M25611 Stiffness of right shoulder, not elsewhere classified: Secondary | ICD-10-CM | POA: Diagnosis not present

## 2015-12-19 DIAGNOSIS — R2681 Unsteadiness on feet: Secondary | ICD-10-CM

## 2015-12-19 DIAGNOSIS — M25512 Pain in left shoulder: Secondary | ICD-10-CM | POA: Diagnosis not present

## 2015-12-19 DIAGNOSIS — M25642 Stiffness of left hand, not elsewhere classified: Secondary | ICD-10-CM | POA: Diagnosis not present

## 2015-12-19 DIAGNOSIS — M6249 Contracture of muscle, multiple sites: Secondary | ICD-10-CM

## 2015-12-19 DIAGNOSIS — R2689 Other abnormalities of gait and mobility: Secondary | ICD-10-CM

## 2015-12-19 DIAGNOSIS — M25542 Pain in joints of left hand: Secondary | ICD-10-CM | POA: Diagnosis not present

## 2015-12-19 NOTE — Therapy (Signed)
[facility] [address] [place], [place], [address] Phone: [phone]   Fax:  [phone]  Physical Therapy Treatment  Patient Details  Name: Julie Elliott MRN: [id number] Date of Birth: Feb 25, 1956 Referring Provider: Sarina Ill, MD  Encounter Date: 12/19/2015      PT End of Session - 12/19/15 1453    Visit Number 3   Number of Visits 17   Date for PT Re-Evaluation 02/09/16   Authorization Type Medicare Traditional primary; Medicaid secondary   Authorization Time Period G Codes and PN required every 10 visits   PT Start Time 1448   PT Stop Time 1531   PT Time Calculation (min) 43 min   Activity Tolerance Patient tolerated treatment well;Patient limited by pain   Behavior During Therapy [facility] for tasks assessed/performed      Past Medical History  Diagnosis Date  . Hypertension     well controlled  . DJD (degenerative joint disease)   . HLD (hyperlipidemia) 2008    stable, well controlled  . DM (diabetes mellitus) ([place]) 2008    stable HgBA1C at 6.5  . GERD (gastroesophageal reflux disease)     well controlled on Omeprazole  . Chronic headaches   . CHF (congestive heart failure) [facility])     Past Surgical History  Procedure Laterality Date  . Tubal ligation    . Carpel tunnel Right     20 yrs ago  . Cervical spine surgery  2016    There were no vitals filed for this visit.      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 No/denies                          [facility] Adult PT Treatment/Exercise - 12/19/15 1455    Bed Mobility   Bed Mobility Left Sidelying to Sit   Left Sidelying to Sit 6: Modified independent (Device/Increase time)  instructed in proper mechanics   Supine to Sit 6: Modified independent (Device/Increase time)   Sit to Supine 6: Modified independent (Device/Increase time)   Transfers   Transfers Sit to Stand;Stand to Sit   Sit to Stand 5: Supervision   Five time sit to stand comments  --   Stand to Sit 5: Supervision   Ambulation/Gait   Ambulation/Gait Yes   Ambulation/Gait Assistance 5: Supervision   Ambulation/Gait Assistance Details Trailed ambulation with single forearm crutch in RUE and PLS 10' , pt states she perfers to use [facility] and did not like the way the brace felt in her shoe. Ambulated without AD 100 x2, pt demostrated toe walking and was cued on walking heel-toe with some improvements on foot contact.   Ambulation Distance (Feet) 210 Feet  total   Assistive device Small based quad cane;R Forearm Crutch;Other (Comment)   Gait Pattern Step-through pattern;Decreased arm swing - left;Decreased stride length;Decreased hip/knee flexion - right;Decreased hip/knee flexion - left;Decreased dorsiflexion - left;Left foot flat;Right flexed knee in stance;Left flexed knee in stance;Decreased  trunk rotation;Wide base of support  does not achieve neutal hip ext throughout gait cycle   Ambulation Surface Level;Indoor   Stairs --   [employer] --   Stair Management Technique --   Number of Stairs --   Height of Stairs --   High Level Balance   High Level Balance Activities Side stepping;Backward walking   High Level Balance Comments Pt able to side step both directions and walk backwards with no LOB  and indicates that she does these at home as well.    Exercises   Exercises Other Exercises   Other Exercises  Pt performed B hamstring stretches 1x 60s holds, B ankle PR stretches  using cane to help pull foot up and out, B hip flexor self stretch with emphasis on stretch of psoas. Atttempted tall kneeling with B UE support, could not continue due to pain in patients knee  increased time required for transitional movements                PT Education - 12/19/15 1654    Education provided Yes   Education Details Modified PF self stretch for patient to use cane instead of sheet   Person(s) Educated Patient   Methods Explanation;Demonstration   Comprehension Verbalized understanding;Returned demonstration          PT Short Term Goals - 12/19/15 1702    PT SHORT TERM GOAL #1   Title Pt will perform initial home stretching program with mod I using paper handout to indicate safe compliance with HEP.  (Target date: 01/08/16)   Status Achieved   PT SHORT TERM GOAL #2   Title Pt will improve active L ankle dorsiflexion from -2 degrees to >/= 3 degrees to facilitate consistent L foot clearance during gait.   PT SHORT TERM GOAL #3   Title Pt will improve R knee extension AROM from -26 degrees to -20 degrees to progress toward more normalized gait pattern.    PT SHORT TERM GOAL #4   Title Pt will improve L knee AROM from -12 degrees to -7 degrees to progress toward more normalized gait pattern.    PT SHORT TERM GOAL #5   Title Pt will consistently perform sit <> stand from standard chair with mod I, normal BOS, and symmetrical LE placement.   PT SHORT TERM GOAL #6   Title Pt will negotiate 4 stairs with R rail with mod I using LRAD to indicate pt safety using primary home entrance.    PT SHORT TERM GOAL #7   Title Pt will negotiate standard ramp and curb step with supervision using LRAD to progress toward independence with community obstacle negotiation.            PT Long Term Goals - 12/11/15 1939    PT LONG TERM GOAL #1   Title Pt will independently demo HEP to maximize functional gains made in PT.  (Target date: 02/05/16)   PT LONG TERM GOAL #2   Title Pt  will improve R knee extension AROM from -26 degrees to -15 degrees to promote more normalized gait pattern, increase energy/time efficiency of mobility.    PT LONG TERM GOAL #3   Title Pt will improve L knee extension AROM from -12 degrees to -2 degrees to promote more normalized gait pattern, increase energy/time efficiency of mobility.    PT LONG TERM GOAL #4   Title Pt will improve gait velocity from 0.86 ft/sec to >/= 1.29 ft/sec to indicate significantly increased efficiency of  ambulation.   PT LONG TERM GOAL #5   Title Pt will improve TUG time from 38.57 seconds to </= 28.17 seconds to indicate significant improvement in efficiency of mobility.    Additional Long Term Goals   Additional Long Term Goals Yes   PT LONG TERM GOAL #6   Title Pt will negotiate standard ramp and curb step with mod I using LRAD to indicate increased stability/independence with community obstacle negotiation.    PT LONG TERM GOAL #7   Title Pt will ambulate >300' over level and unlevel indoor and (paved) outdoor surfaces with mod I using LRAD to indicate increased safety with community mobility.                Plan - 12/19/15 1656    Clinical Impression Statement Pt demostrated proper technique and good understanding of HEP for stretching. Ambulation focused on attempts with multiple assistive devices to conserve engery and normalize gait pattern, however pt stated she perfers walking with [facility] or without a AD. Exercise during session was limited due to patient pain and discouragement.    Rehab Potential Good   Clinical Impairments Affecting Rehab Potential Positive: intact cognition, pt motivation, h/o compliance, age, and family support. Negative: multiple contractures   PT Frequency 2x / week   PT Duration 8 weeks   PT Treatment/Interventions Orthotic Fit/Training;ADLs/Self Care Home Management;Gait training;Stair training;Functional mobility training;Therapeutic activities;Moist Heat;Electrical  Stimulation;Therapeutic exercise;Balance training;Patient/family education;Neuromuscular re-education;Passive range of motion;Manual techniques   PT Next Visit Plan Assess stretching HEP. Continue to address contractures in B hip flexors, B hamstrings, L gastroc. Assess gait with different L AFO's (consider revisiting L Reaction using supination control strap).   PT Home Exercise Plan Progress to core stabilization and strengthen of hip girdle.   Consulted and Agree with Plan of Care Patient      Patient will benefit from skilled therapeutic intervention in order to improve the following deficits and impairments:  Abnormal gait, Decreased knowledge of use of DME, Decreased balance, Decreased range of motion, Impaired flexibility, Postural dysfunction, Impaired tone, Decreased strength, Impaired UE functional use, Decreased coordination  Visit Diagnosis: Other abnormalities of gait and mobility  Muscle weakness (generalized)  Contracture of muscle, multiple sites  Unsteadiness on feet     Problem List Patient Active Problem List   Diagnosis Date Noted  . Spastic quadriparesis ([place]) 11/02/2015  . Tobacco abuse 08/16/2015  . Peripheral neuropathy ([place]) 07/17/2015  . Cervical cord compression with myelopathy ([place]) 02/08/2015  . Frozen shoulder--left 09/05/2014  . Unintentional weight loss 06/20/2014  . Health care maintenance 03/23/2012  . Hypertension associated with diabetes ([place]) 08/17/2006  . GERD 08/17/2006  . History of diabetes mellitus, type II 08/11/2006  . Hyperlipidemia associated with type 2 diabetes mellitus ([place]) 08/11/2006  . Osteoarthritis 08/11/2006    Dillard Essex, SPT 12/19/2015, 5:07 PM  [facility] [address] [place], [place], [address] Phone: [phone]   Fax:  [phone]  Name: Davita Federico MRN: [id number] Date of Birth: [birth year]

## 2015-12-20 ENCOUNTER — Ambulatory Visit: Payer: Medicare Other | Admitting: Physical Therapy

## 2015-12-20 ENCOUNTER — Encounter: Payer: Medicare Other | Admitting: Occupational Therapy

## 2015-12-24 ENCOUNTER — Ambulatory Visit: Payer: Medicare Other | Admitting: Physical Therapy

## 2015-12-24 ENCOUNTER — Encounter: Payer: Medicare Other | Admitting: Occupational Therapy

## 2015-12-24 DIAGNOSIS — M25611 Stiffness of right shoulder, not elsewhere classified: Secondary | ICD-10-CM | POA: Diagnosis not present

## 2015-12-24 DIAGNOSIS — M25612 Stiffness of left shoulder, not elsewhere classified: Secondary | ICD-10-CM | POA: Diagnosis not present

## 2015-12-24 DIAGNOSIS — M25642 Stiffness of left hand, not elsewhere classified: Secondary | ICD-10-CM | POA: Diagnosis not present

## 2015-12-24 DIAGNOSIS — M25512 Pain in left shoulder: Secondary | ICD-10-CM | POA: Diagnosis not present

## 2015-12-24 DIAGNOSIS — M6249 Contracture of muscle, multiple sites: Secondary | ICD-10-CM

## 2015-12-24 DIAGNOSIS — R2689 Other abnormalities of gait and mobility: Secondary | ICD-10-CM

## 2015-12-24 DIAGNOSIS — M6281 Muscle weakness (generalized): Secondary | ICD-10-CM

## 2015-12-24 DIAGNOSIS — M25542 Pain in joints of left hand: Secondary | ICD-10-CM | POA: Diagnosis not present

## 2015-12-24 NOTE — Therapy (Signed)
[place] [address] [place], [place], [address] Phone: [phone]   Fax:  [phone]  Physical Therapy Treatment  Patient Details  Name: Julie Elliott MRN: [id number] Date of Birth: [birth year] Referring Provider: Sarina Ill, MD  Encounter Date: 12/24/2015      PT End of Session - 12/24/15 1651    Visit Number 4   Number of Visits 17   Date for PT Re-Evaluation 02/09/16   Authorization Type Medicare Traditional primary; Medicaid secondary   Authorization Time Period G Codes and PN required every 10 visits   PT Start Time 1539   PT Stop Time 1620   PT Time Calculation (min) 41 min   Activity Tolerance Patient tolerated treatment well   Behavior During Therapy [facility] for tasks assessed/performed      Past Medical History  Diagnosis Date  . Hypertension     well controlled  . DJD (degenerative joint disease)   . HLD (hyperlipidemia) 2008    stable, well controlled  . DM (diabetes mellitus) ([place]) 2008    stable HgBA1C at 6.5  . GERD (gastroesophageal reflux disease)     well controlled on Omeprazole  . Chronic headaches   . CHF (congestive heart failure) [facility])     Past Surgical History  Procedure Laterality Date  . Tubal ligation    . Carpel tunnel Right     20 yrs ago  . Cervical spine surgery  2016    There were no vitals filed for this visit.      Subjective Assessment - 12/24/15 1641    Subjective Pt continues to perform home stretching program at home daily. States, "I'm feeling better. 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 No/denies                          [facility] Adult PT Treatment/Exercise - 12/24/15 0001    Transfers   Transfers Sit to Stand;Stand to Sit   Sit to Stand 5: Supervision   Five time sit to stand comments  very wide BOS   Stand to Sit 5: Supervision   Ambulation/Gait   Ambulation/Gait Yes   Ambulation/Gait Assistance 5: Supervision   Assistive device Small based quad cane   Gait Pattern Step-through pattern;Decreased arm swing - left;Decreased stride length;Decreased hip/knee flexion - right;Decreased hip/knee flexion - left;Decreased dorsiflexion - left;Left foot flat;Right flexed knee in stance;Left flexed knee in stance;Decreased trunk rotation;Wide base of support;Decreased dorsiflexion - right  limited B hip ext; ankle PF (L > R) during mid-term stance   Ambulation Surface Level;Indoor   Exercises   Exercises Other Exercises   Other Exercises  Prone: passive stretch of B iliopsoas x3 minutes per side; contract-relax PNF alternating with prolonged passive stretch of B rectus femoris 3 x45-sec holds per side. Prone: B hip extension with knee flexion 2 x10 reps on L (with max manual facilitation to maintain knee flexion) and x10 reps on R (with min manual facilitation). Prolonged passive stretch of L TFL x2 minute holds (supine ) and of B gluteus medius x60-sec holds per side. Seated: L TFL/gluteus medius  self-stretch 2 x1-minute holds. Standing L heel cord stretch on step 2 x1-minute holds; standing B heel cord stretch with BUE support at countertop x1-minute hold.  to address BLE spasticity, normalize posture and gait                PT Education - 12/24/15 1641    Education provided Yes   Education Details Recommended pt perform heel stretch in standing for increased WB. Explained adjustable-tension PRAFO's for prolonged passive stretch of B heel cords.   Person(s) Educated Patient   Methods Explanation;Demonstration;Handout;Verbal cues   Comprehension Returned  demonstration;Verbalized understanding          PT Short Term Goals - 12/19/15 1702    PT SHORT TERM GOAL #1   Title Pt will perform initial home stretching program with mod I using paper handout to indicate safe compliance with HEP.  (Target date: 01/08/16)   Status Achieved   PT SHORT TERM GOAL #2   Title Pt will improve active L ankle dorsiflexion from -2 degrees to >/= 3 degrees to facilitate consistent L foot clearance during gait.   PT SHORT TERM GOAL #3   Title Pt will improve R knee extension AROM from -26 degrees to -20 degrees to progress toward more normalized gait pattern.    PT SHORT TERM GOAL #4   Title Pt will improve L knee AROM from -12 degrees to -7 degrees to progress toward more normalized gait pattern.    PT SHORT TERM GOAL #5   Title Pt will consistently perform sit <> stand from standard chair with mod I, normal BOS, and symmetrical LE placement.   PT SHORT TERM GOAL #6   Title Pt will negotiate 4 stairs with R rail with mod I using LRAD to indicate pt safety using primary home entrance.    PT SHORT TERM GOAL #7   Title Pt will negotiate standard ramp and curb step with supervision using LRAD to progress toward independence with community obstacle negotiation.            PT Long Term Goals - 12/11/15 1939    PT LONG TERM GOAL #1   Title Pt will independently demo HEP to maximize functional gains made in PT.  (Target date: 02/05/16)   PT LONG TERM GOAL #2   Title Pt will improve R knee extension AROM from -26 degrees to -15 degrees to promote more normalized gait pattern, increase energy/time efficiency of mobility.    PT LONG TERM GOAL #3   Title Pt will improve L knee extension AROM from -12 degrees to -2 degrees to promote more normalized gait pattern, increase energy/time efficiency of mobility.    PT LONG TERM GOAL #4   Title Pt will improve gait velocity from 0.86 ft/sec to >/= 1.29 ft/sec to indicate significantly increased efficiency of ambulation.   PT  LONG TERM GOAL #5   Title Pt will improve TUG time from 38.57 seconds to </= 28.17 seconds to indicate significant improvement in efficiency of mobility.    Additional Long Term Goals   Additional Long Term Goals Yes   PT LONG TERM GOAL #6   Title Pt will negotiate standard ramp and curb step with mod I using LRAD to indicate increased stability/independence with community obstacle negotiation.    PT LONG TERM GOAL #7   Title Pt will ambulate >300' over level and unlevel indoor and (paved) outdoor surfaces with mod I using LRAD to indicate increased safety with community mobility.  Plan - 12/24/15 1652    Clinical Impression Statement Session focused on continued stretching for BLE spasticity management to normalize postural alignment and gait pattern. Pt may benefit from tension-adjusting PRAFO's to address B heel cord contractures.   Rehab Potential Good   Clinical Impairments Affecting Rehab Potential Positive: intact cognition, pt motivation, h/o compliance, age, and family support. Negative: multiple contractures   PT Frequency 2x / week   PT Duration 8 weeks   PT Treatment/Interventions Orthotic Fit/Training;ADLs/Self Care Home Management;Gait training;Stair training;Functional mobility training;Therapeutic activities;Moist Heat;Electrical Stimulation;Therapeutic exercise;Balance training;Patient/family education;Neuromuscular re-education;Passive range of motion;Manual techniques   PT Next Visit Plan  Continue to address contractures in B hip flexors, B hamstrings, L gastroc.  ? Tension-adjusting PRAFO's   Consulted and Agree with Plan of Care Patient      Patient will benefit from skilled therapeutic intervention in order to improve the following deficits and impairments:  Abnormal gait, Decreased knowledge of use of DME, Decreased balance, Decreased range of motion, Impaired flexibility, Postural dysfunction, Impaired tone, Decreased strength, Impaired UE  functional use, Decreased coordination  Visit Diagnosis: Other abnormalities of gait and mobility  Muscle weakness (generalized)  Contracture of muscle, multiple sites     Problem List Patient Active Problem List   Diagnosis Date Noted  . Spastic quadriparesis ([place]) 11/02/2015  . Tobacco abuse 08/16/2015  . Peripheral neuropathy ([place]) 07/17/2015  . Cervical cord compression with myelopathy ([place]) 02/08/2015  . Frozen shoulder--left 09/05/2014  . Unintentional weight loss 06/20/2014  . Health care maintenance 03/23/2012  . Hypertension associated with diabetes ([place]) 08/17/2006  . GERD 08/17/2006  . History of diabetes mellitus, type II 08/11/2006  . Hyperlipidemia associated with type 2 diabetes mellitus ([place]) 08/11/2006  . Osteoarthritis 08/11/2006   Billie Ruddy, PT, DPT [facility] [address] [place], [place], [address] Phone: [phone]   Fax:  [phone] 12/24/2015, 4:55 PM  Name: Julie Elliott MRN: [id number] Date of Birth: 1956/04/03

## 2015-12-25 ENCOUNTER — Ambulatory Visit: Payer: Medicare Other | Admitting: Physical Therapy

## 2015-12-25 ENCOUNTER — Telehealth: Payer: Self-pay | Admitting: Physical Therapy

## 2015-12-25 DIAGNOSIS — M6249 Contracture of muscle, multiple sites: Secondary | ICD-10-CM

## 2015-12-25 DIAGNOSIS — M25642 Stiffness of left hand, not elsewhere classified: Secondary | ICD-10-CM | POA: Diagnosis not present

## 2015-12-25 DIAGNOSIS — M25512 Pain in left shoulder: Secondary | ICD-10-CM | POA: Diagnosis not present

## 2015-12-25 DIAGNOSIS — M25612 Stiffness of left shoulder, not elsewhere classified: Secondary | ICD-10-CM | POA: Diagnosis not present

## 2015-12-25 DIAGNOSIS — R2689 Other abnormalities of gait and mobility: Secondary | ICD-10-CM

## 2015-12-25 DIAGNOSIS — M6281 Muscle weakness (generalized): Secondary | ICD-10-CM

## 2015-12-25 DIAGNOSIS — M25542 Pain in joints of left hand: Secondary | ICD-10-CM | POA: Diagnosis not present

## 2015-12-25 DIAGNOSIS — M25611 Stiffness of right shoulder, not elsewhere classified: Secondary | ICD-10-CM | POA: Diagnosis not present

## 2015-12-25 DIAGNOSIS — G8250 Quadriplegia, unspecified: Secondary | ICD-10-CM

## 2015-12-25 NOTE — Telephone Encounter (Signed)
Thanks Kristopher Glee, can you place these orders please?   Thanks!

## 2015-12-25 NOTE — Telephone Encounter (Signed)
Dr. Jaynee Eagles,  I've been seeing Ms. Rick Duff for PT at outpatient neuro rehab. Ms. Eifert mobility is progressing, but she is limited by contractures in bilateral ankle plantarflexors. Ms. Lundquist would benefit from adjustable-tension PRAFO's to increase functional length of ankle plantarflexors.  If you agree, please place an order for bilateral adjustable-tension PRAFO's. Please don't hesitate to contact me with any questions.  Thanks so much,  Billie Ruddy, PT, DPT [facility] [address] [place], [place], [address] Phone: [phone]   Fax:  [phone] 12/25/2015, 3:55 PM

## 2015-12-25 NOTE — Therapy (Signed)
[facility] [address] [place], [place], [address] Phone: [phone]   Fax:  [phone]  Physical Therapy Treatment  Patient Details  Name: Quenetta Wiman MRN: [id number] Date of Birth: July 03, 1956 Referring Provider: Sarina Ill, MD  Encounter Date: 12/25/2015      PT End of Session - 12/25/15 1841    Visit Number 5   Number of Visits 17   Date for PT Re-Evaluation 02/09/16   Authorization Type Medicare Traditional primary; Medicaid secondary   Authorization Time Period G Codes and PN required every 10 visits   PT Start Time 1536   PT Stop Time 1620   PT Time Calculation (min) 44 min   Activity Tolerance Patient tolerated treatment well;No increased pain   Behavior During Therapy [facility] for tasks assessed/performed      Past Medical History  Diagnosis Date  . Hypertension     well controlled  . DJD (degenerative joint disease)   . HLD (hyperlipidemia) 2008    stable, well controlled  . DM (diabetes mellitus) ([place]) 2008    stable HgBA1C at 6.5  . GERD (gastroesophageal reflux disease)     well controlled on Omeprazole  . Chronic headaches   . CHF (congestive heart failure) [facility])     Past Surgical History  Procedure Laterality Date  . Tubal ligation    . Carpel tunnel Right     20 yrs ago  . Cervical spine surgery  2016    There were no vitals filed for this visit.      Subjective Assessment - 12/25/15 1549    Subjective Pt feels as though PT is helping, stating, "The stretching is working." Pt has given some thought to PT suggestion of adjustable-tension PRAFO's and is agreeable to trying this.    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 No/denies                         [facility] Adult PT Treatment/Exercise - 12/25/15 0001    Transfers   Transfers Sit to Stand;Stand to Sit   Sit to Stand 5: Supervision;4: Min guard;4: Min assist   Sit to Stand Details (indicate cue type and reason) Blocked practice of sit <> stand from progressively lower mat table height; initially from wedge on elevated mat table to promote anterior pelvic tilt. Cueing focused on setup (scooting to EOM, placement of B feet). During sit > stand, cueing focused on full anterior weight shift, maintaining L foot in contact with grounf (onset of spasticity caused increased ankle PF). Verbal cueing focused on more narrow (normalized) BOS.  Following blocked practice, pt able to perform sit > stand from standard chair with single UE use with normalize BOS, symmetrical WB, and midline posture using single UE without difficulty.  sit <> stand x16 reps total   Stand to Sit 5: Supervision   Stand to Sit Details Cueing for full anterior weight shift and slow, controlled descent.   Ambulation/Gait   Ambulation/Gait Yes   Ambulation/Gait Assistance 5: Supervision   Ambulation/Gait Assistance Details Gait [phone]' then x100' without AD with cueing for lateral weight shift to L, increased B arm swing, increased trunk rotation. Noted improved postural alignment when  pt not using SBQC. Performed gait x30' with SPC, cueing for sequencing, upright posture. Pt prefers SBQC due to weight of QC and pt-perceived stability when using QC.   Ambulation Distance (Feet) 245 Feet   Assistive device None;Straight cane;Small based quad cane   Gait Pattern Step-through pattern;Decreased arm swing - left;Decreased stride length;Decreased hip/knee flexion - right;Decreased hip/knee flexion - left;Decreased dorsiflexion - left;Left foot flat;Right flexed knee in stance;Left flexed knee in stance;Decreased trunk rotation;Wide base of support;Decreased dorsiflexion -  right  limited B hip ext; ankle PF (L > R) during mid-term stance   Ambulation Surface Level;Indoor   Exercises   Exercises Other Exercises   Other Exercises  Pt supine with moist heat at B hamstrings (to promote relaxation, increase muscle extensibility, and maximize effectiveness of stretching): prolonged passive stretch of B plantar flexors x5 minutes per side; manual prolonged passive stretch of B hamstrings x3 minutes per side.                PT Education - 12/25/15 1830    Education provided Yes   Education Details Recommending bilateral adjustable-tension PRAFO's to address contractures in B ankle plantar flexors.   Person(s) Educated Patient   Methods Explanation   Comprehension Verbalized understanding          PT Short Term Goals - 12/19/15 1702    PT SHORT TERM GOAL #1   Title Pt will perform initial home stretching program with mod I using paper handout to indicate safe compliance with HEP.  (Target date: 01/08/16)   Status Achieved   PT SHORT TERM GOAL #2   Title Pt will improve active L ankle dorsiflexion from -2 degrees to >/= 3 degrees to facilitate consistent L foot clearance during gait.   PT SHORT TERM GOAL #3   Title Pt will improve R knee extension AROM from -26 degrees to -20 degrees to progress toward more normalized gait pattern.    PT SHORT TERM GOAL #4   Title Pt will improve L knee AROM from -12 degrees to -7 degrees to progress toward more normalized gait pattern.    PT SHORT TERM GOAL #5   Title Pt will consistently perform sit <> stand from standard chair with mod I, normal BOS, and symmetrical LE placement.   PT SHORT TERM GOAL #6   Title Pt will negotiate 4 stairs with R rail with mod I using LRAD to indicate pt safety using primary home entrance.    PT SHORT TERM GOAL #7   Title Pt will negotiate standard ramp and curb step with supervision using LRAD to progress toward independence with community obstacle negotiation.            PT  Long Term Goals - 12/11/15 1939    PT LONG TERM GOAL #1   Title Pt will independently demo HEP to maximize functional gains made in PT.  (Target date: 02/05/16)   PT LONG TERM GOAL #2   Title Pt will improve R knee extension AROM from -26 degrees to -15 degrees to promote more normalized gait pattern, increase energy/time efficiency of mobility.    PT LONG TERM GOAL #3   Title Pt will improve L knee extension AROM from -12 degrees to -2 degrees to promote more normalized gait pattern, increase energy/time efficiency of mobility.    PT LONG TERM GOAL #4   Title Pt will improve gait velocity from 0.86 ft/sec to >/= 1.29 ft/sec to indicate significantly increased efficiency of ambulation.   PT  LONG TERM GOAL #5   Title Pt will improve TUG time from 38.57 seconds to </= 28.17 seconds to indicate significant improvement in efficiency of mobility.    Additional Long Term Goals   Additional Long Term Goals Yes   PT LONG TERM GOAL #6   Title Pt will negotiate standard ramp and curb step with mod I using LRAD to indicate increased stability/independence with community obstacle negotiation.    PT LONG TERM GOAL #7   Title Pt will ambulate >300' over level and unlevel indoor and (paved) outdoor surfaces with mod I using LRAD to indicate increased safety with community mobility.                Plan - 12/25/15 1842    Clinical Impression Statement Session focused on increasing functional length of B hamstrings and plantar flexors; increasing pt independence with functional transfers; and normalizing gait pattern to further address spasticity. Pt agreeable to B adjustable-tension PRAFO's. Will schedule orthotist consult.    Rehab Potential Good   Clinical Impairments Affecting Rehab Potential Positive: intact cognition, pt motivation, h/o compliance, age, and family support. Negative: multiple contractures   PT Frequency 2x / week   PT Duration 8 weeks   PT Treatment/Interventions Orthotic  Fit/Training;ADLs/Self Care Home Management;Gait training;Stair training;Functional mobility training;Therapeutic activities;Moist Heat;Electrical Stimulation;Therapeutic exercise;Balance training;Patient/family education;Neuromuscular re-education;Passive range of motion;Manual techniques   PT Next Visit Plan  Continue to address contractures in B hip flexors, B hamstrings, L gastroc.  ? Tension-adjusting PRAFO's   Recommended Other Services 5/23: Received MD order for B tension-adjusting PRAFO's. Will schedule orthotist consult (pt requesting to have consult at [facility] neuro due to transportation limitations).    Consulted and Agree with Plan of Care Patient      Patient will benefit from skilled therapeutic intervention in order to improve the following deficits and impairments:  Abnormal gait, Decreased knowledge of use of DME, Decreased balance, Decreased range of motion, Impaired flexibility, Postural dysfunction, Impaired tone, Decreased strength, Impaired UE functional use, Decreased coordination  Visit Diagnosis: Other abnormalities of gait and mobility  Contracture of muscle, multiple sites  Muscle weakness (generalized)     Problem List Patient Active Problem List   Diagnosis Date Noted  . Spastic quadriparesis ([place]) 11/02/2015  . Tobacco abuse 08/16/2015  . Peripheral neuropathy ([place]) 07/17/2015  . Cervical cord compression with myelopathy ([place]) 02/08/2015  . Frozen shoulder--left 09/05/2014  . Unintentional weight loss 06/20/2014  . Health care maintenance 03/23/2012  . Hypertension associated with diabetes ([place]) 08/17/2006  . GERD 08/17/2006  . History of diabetes mellitus, type II 08/11/2006  . Hyperlipidemia associated with type 2 diabetes mellitus ([place]) 08/11/2006  . Osteoarthritis 08/11/2006   Billie Ruddy, PT, DPT [facility] [address] [place], [place], [address] Phone: [phone]   Fax:  [phone] 12/25/2015,  6:46 PM  Name: Chiann Plaster MRN: [id number] Date of Birth: [birth year]

## 2015-12-26 NOTE — Telephone Encounter (Signed)
Order placed, to Dr. Jaynee Eagles for signature.

## 2015-12-27 ENCOUNTER — Encounter: Payer: Medicare Other | Admitting: Occupational Therapy

## 2015-12-27 NOTE — Telephone Encounter (Signed)
Faxed order to [phone]. Received confirmation.

## 2016-01-01 ENCOUNTER — Encounter: Payer: Medicare Other | Admitting: Occupational Therapy

## 2016-01-03 ENCOUNTER — Encounter: Payer: Medicare Other | Admitting: Occupational Therapy

## 2016-01-03 ENCOUNTER — Ambulatory Visit: Payer: Medicare Other | Attending: Physical Medicine & Rehabilitation | Admitting: Physical Therapy

## 2016-01-03 DIAGNOSIS — M6281 Muscle weakness (generalized): Secondary | ICD-10-CM | POA: Insufficient documentation

## 2016-01-03 DIAGNOSIS — M25642 Stiffness of left hand, not elsewhere classified: Secondary | ICD-10-CM | POA: Diagnosis not present

## 2016-01-03 DIAGNOSIS — R2689 Other abnormalities of gait and mobility: Secondary | ICD-10-CM | POA: Diagnosis not present

## 2016-01-03 DIAGNOSIS — M25612 Stiffness of left shoulder, not elsewhere classified: Secondary | ICD-10-CM | POA: Insufficient documentation

## 2016-01-03 DIAGNOSIS — M25542 Pain in joints of left hand: Secondary | ICD-10-CM | POA: Insufficient documentation

## 2016-01-03 DIAGNOSIS — R2681 Unsteadiness on feet: Secondary | ICD-10-CM | POA: Diagnosis not present

## 2016-01-03 DIAGNOSIS — M25512 Pain in left shoulder: Secondary | ICD-10-CM | POA: Insufficient documentation

## 2016-01-03 DIAGNOSIS — G8250 Quadriplegia, unspecified: Secondary | ICD-10-CM | POA: Diagnosis not present

## 2016-01-03 DIAGNOSIS — H8111 Benign paroxysmal vertigo, right ear: Secondary | ICD-10-CM | POA: Insufficient documentation

## 2016-01-03 DIAGNOSIS — M6249 Contracture of muscle, multiple sites: Secondary | ICD-10-CM | POA: Diagnosis not present

## 2016-01-03 NOTE — Therapy (Signed)
[facility] [address] [place], [place], [address] Phone: [phone]   Fax:  [phone]  Physical Therapy Treatment  Patient Details  Name: Julie Elliott MRN: [id number] Date of Birth: May 03, 1956 Referring Provider: Sarina Ill, MD  Encounter Date: 01/03/2016      PT End of Session - 01/03/16 1635    Visit Number 6   Number of Visits 17   Date for PT Re-Evaluation 02/09/16   Authorization Type Medicare Traditional primary; Medicaid secondary   Authorization Time Period G Codes and PN required every 10 visits   PT Start Time 1536   PT Stop Time 1621   PT Time Calculation (min) 45 min   Equipment Utilized During Treatment Gait belt   Activity Tolerance Patient tolerated treatment well   Behavior During Therapy [facility] for tasks assessed/performed      Past Medical History  Diagnosis Date  . Hypertension     well controlled  . DJD (degenerative joint disease)   . HLD (hyperlipidemia) 2008    stable, well controlled  . DM (diabetes mellitus) ([place]) 2008    stable HgBA1C at 6.5  . GERD (gastroesophageal reflux disease)     well controlled on Omeprazole  . Chronic headaches   . CHF (congestive heart failure) [facility])     Past Surgical History  Procedure Laterality Date  . Tubal ligation    . Carpel tunnel Right     20 yrs ago  . Cervical spine surgery  2016    There were no vitals filed for this visit.      Subjective Assessment - 01/03/16 1632    Subjective Pt arrives to this session accompanied by son. Pt reports no falls and no significant changes. 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 No/denies            [facility] PT Assessment - 01/03/16 0001    AROM   Right Knee Extension -19   Left Knee Extension -12                     [facility] Adult PT Treatment/Exercise - 01/03/16 0001    Transfers   Transfers Sit to Stand;Stand to Sit   Sit to Stand 6: Modified independent (Device/Increase time)   Sit to Stand Details (indicate cue type and reason) Noted more narrow (normalized) BOS today during sit <> stand from mat table. No cueing required.   Stand to Sit 6: Modified independent (Device/Increase time)   Ambulation/Gait   Ambulation/Gait Yes   Ambulation/Gait Assistance 5: Supervision;4: Min guard   Ambulation/Gait Assistance Details Gait [phone]' without AD, no AFO's with orthotist observing gait pattern. Gait 140' x2 trials without AD with supervision, no overt LOB. See therex (below) for additional information on gait x115' with B AFO's; pt utilized [facility] when ambulating with AFO's due to pt-perceived instability.   Ambulation Distance (Feet) 510 Feet   Assistive device None;Straight cane;Small based quad cane  B AFO's (Reaction)   Gait Pattern Step-through pattern;Decreased arm swing - left;Decreased stride length;Decreased hip/knee flexion - right;Decreased hip/knee flexion - left;Decreased dorsiflexion -  left;Left foot flat;Right flexed knee in stance;Left flexed knee in stance;Decreased trunk rotation;Wide base of support;Decreased dorsiflexion - right  limited B hip ext; ankle PF (L > R) during mid-term stance   Ambulation Surface Level;Indoor   Gait Comments Orthotic fit/train: orthotist, Malena Edman, present to discuss options for night splinting/tension-adjusting PRAFO's to address  B plantarflexor contractures to normalize gait pattern and postural alignment. Pt and son educated on importance of consistency of wear to maximize gains. Pt in full agreement. Pt reports son can assist with donning  (at night) and [place] aide can assist with doffing (in morning).   Exercises   Exercises Other Exercises   Other Exercises  Performed gait x115' with B [place] Reaction AFO's to promote dynamic stretch of B heel cords.                 PT Education - 01/03/16 1637    Education provided Yes   Education Details PT and orthotist educated pt/son on use of night splints to address heel cord tightness. Emphasized consistency of wear to maximize impact.   Person(s) Educated Patient;Child(ren)   Methods Explanation   Comprehension Verbalized understanding          PT Short Term Goals - 01/03/16 1616    PT SHORT TERM GOAL #1   Title Pt will perform initial home stretching program with mod I using paper handout to indicate safe compliance with HEP.  (Target date: 01/08/16)   Status Achieved   PT SHORT TERM GOAL #2   Title Pt will improve active L ankle dorsiflexion from -2 degrees to >/= 3 degrees to facilitate consistent L foot clearance during gait.   PT SHORT TERM GOAL #3   Title Pt will improve R knee extension AROM from -26 degrees to -20 degrees to progress toward more normalized gait pattern.    Baseline 6/1: R knee extension AROM = -19 degrees   Status Achieved   PT SHORT TERM GOAL #4   Title Pt will improve L knee extension AROM from -12 degrees to -7 degrees to progress toward more normalized gait pattern.    Baseline --   Status --   PT SHORT TERM GOAL #5   Title Pt will consistently perform sit <> stand from standard chair with mod I, normal BOS, and symmetrical LE placement.   PT SHORT TERM GOAL #6   Title Pt will negotiate 4 stairs with R rail with mod I using LRAD to indicate pt safety using primary home entrance.    PT SHORT TERM GOAL #7   Title Pt will negotiate standard ramp and curb step with supervision using LRAD to progress toward independence with community obstacle negotiation.            PT Long Term Goals - 12/11/15 1939    PT LONG TERM GOAL #1   Title  Pt will independently demo HEP to maximize functional gains made in PT.  (Target date: 02/05/16)   PT LONG TERM GOAL #2   Title Pt will improve R knee extension AROM from -26 degrees to -15 degrees to promote more normalized gait pattern, increase energy/time efficiency of mobility.    PT LONG TERM GOAL #3   Title Pt will improve L knee extension AROM from -12 degrees to -2 degrees to promote more normalized gait pattern, increase energy/time efficiency of mobility.    PT LONG TERM GOAL #4   Title Pt will improve gait velocity from 0.86 ft/sec to >/= 1.29 ft/sec to  indicate significantly increased efficiency of ambulation.   PT LONG TERM GOAL #5   Title Pt will improve TUG time from 38.57 seconds to </= 28.17 seconds to indicate significant improvement in efficiency of mobility.    Additional Long Term Goals   Additional Long Term Goals Yes   PT LONG TERM GOAL #6   Title Pt will negotiate standard ramp and curb step with mod I using LRAD to indicate increased stability/independence with community obstacle negotiation.    PT LONG TERM GOAL #7   Title Pt will ambulate >300' over level and unlevel indoor and (paved) outdoor surfaces with mod I using LRAD to indicate increased safety with community mobility.                Plan - 01/03/16 1649    Clinical Impression Statement Orthotist present to assess/address pt need for B night splints/tension-adjusting PRAFO's to address plantarflexor contractures, promote more normalized postural alignment and gait pattern. Pt agreeable to moving forward with plan for bilateral PRAFO's for wear at night.    Rehab Potential Good   Clinical Impairments Affecting Rehab Potential Positive: intact cognition, pt motivation, h/o compliance, age, and family support. Negative: multiple contractures   PT Frequency 2x / week   PT Duration 8 weeks   PT Treatment/Interventions Orthotic Fit/Training;ADLs/Self Care Home Management;Gait training;Stair  training;Functional mobility training;Therapeutic activities;Moist Heat;Electrical Stimulation;Therapeutic exercise;Balance training;Patient/family education;Neuromuscular re-education;Passive range of motion;Manual techniques   PT Next Visit Plan Trial heat and joint mobilizations (A/P of femur) for increased knee extension ROM.    Consulted and Agree with Plan of Care Patient      Patient will benefit from skilled therapeutic intervention in order to improve the following deficits and impairments:  Abnormal gait, Decreased knowledge of use of DME, Decreased balance, Decreased range of motion, Impaired flexibility, Postural dysfunction, Impaired tone, Decreased strength, Impaired UE functional use, Decreased coordination  Visit Diagnosis: Other abnormalities of gait and mobility  Spastic quadriparesis ([facility])  Contracture of muscle, multiple sites     Problem List Patient Active Problem List   Diagnosis Date Noted  . Spastic quadriparesis ([place]) 11/02/2015  . Tobacco abuse 08/16/2015  . Peripheral neuropathy ([place]) 07/17/2015  . Cervical cord compression with myelopathy ([place]) 02/08/2015  . Frozen shoulder--left 09/05/2014  . Unintentional weight loss 06/20/2014  . Health care maintenance 03/23/2012  . Hypertension associated with diabetes ([place]) 08/17/2006  . GERD 08/17/2006  . History of diabetes mellitus, type II 08/11/2006  . Hyperlipidemia associated with type 2 diabetes mellitus ([place]) 08/11/2006  . Osteoarthritis 08/11/2006   Billie Ruddy, PT, DPT [facility] [address] [place], [place], [address] Phone: [phone]   Fax:  [phone] 01/03/2016, 4:58 PM  Name: Julie Elliott MRN: [id number] Date of Birth: 1956/06/30

## 2016-01-07 ENCOUNTER — Ambulatory Visit: Payer: Medicare Other | Admitting: Occupational Therapy

## 2016-01-07 ENCOUNTER — Encounter: Payer: Self-pay | Admitting: Occupational Therapy

## 2016-01-07 ENCOUNTER — Ambulatory Visit: Payer: Medicare Other | Admitting: Physical Therapy

## 2016-01-07 DIAGNOSIS — M6249 Contracture of muscle, multiple sites: Secondary | ICD-10-CM

## 2016-01-07 DIAGNOSIS — M25512 Pain in left shoulder: Secondary | ICD-10-CM

## 2016-01-07 DIAGNOSIS — R2689 Other abnormalities of gait and mobility: Secondary | ICD-10-CM

## 2016-01-07 DIAGNOSIS — R2681 Unsteadiness on feet: Secondary | ICD-10-CM | POA: Diagnosis not present

## 2016-01-07 DIAGNOSIS — M6281 Muscle weakness (generalized): Secondary | ICD-10-CM | POA: Diagnosis not present

## 2016-01-07 DIAGNOSIS — G8250 Quadriplegia, unspecified: Secondary | ICD-10-CM

## 2016-01-07 DIAGNOSIS — M25542 Pain in joints of left hand: Secondary | ICD-10-CM

## 2016-01-07 DIAGNOSIS — M25612 Stiffness of left shoulder, not elsewhere classified: Secondary | ICD-10-CM

## 2016-01-07 DIAGNOSIS — H8111 Benign paroxysmal vertigo, right ear: Secondary | ICD-10-CM | POA: Diagnosis not present

## 2016-01-07 DIAGNOSIS — M25642 Stiffness of left hand, not elsewhere classified: Secondary | ICD-10-CM

## 2016-01-07 NOTE — Therapy (Signed)
[facility] [address] [place], [place], [address] Phone: [phone]   Fax:  [phone]  Occupational Therapy Treatment  Patient Details  Name: Julie Elliott MRN: [id number] Date of Birth: [birth year] Referring Provider: Dr, Alysia Penna  Encounter Date: 01/07/2016      OT End of Session - 01/07/16 1806    Visit Number 11   Number of Visits 16   Date for OT Re-Evaluation 01/03/16   Authorization Type MCR primary, MCD secondary, will need G code and PN every 10th visit   Authorization Time Period 60 days   Authorization - Visit Number 11   Authorization - Number of Visits 12   OT Start Time [id number]   OT Stop Time 1613   OT Time Calculation (min) 41 min   Activity Tolerance Patient tolerated treatment well      Past Medical History  Diagnosis Date  . Hypertension     well controlled  . DJD (degenerative joint disease)   . HLD (hyperlipidemia) 2008    stable, well controlled  . DM (diabetes mellitus) ([place]) 2008    stable HgBA1C at 6.5  . GERD (gastroesophageal reflux disease)     well controlled on Omeprazole  . Chronic headaches   . CHF (congestive heart failure) [facility])     Past Surgical History  Procedure Laterality Date  . Tubal ligation    . Carpel tunnel Right     20 yrs ago  . Cervical spine surgery  2016    There were no vitals filed for this visit.      Subjective Assessment - 01/07/16 1537    Subjective  I have stiffness but not any true pain (hands, neck, shoulders, back)   Pertinent History see epic   Patient Stated Goals I want my hands back   Currently in Pain? No/denies                      OT Treatments/Exercises (OP) - 01/07/16 1801    Neurological Re-education Exercises   Other Exercises 1 Pt returns today after brief hold for OT.  Pt has been able to maintain both PROM and AROM of LUE from completing HEP.  Pt with active shoulder flexion to 72* and active  abduction to 65* in supine. Pt with PROM shoulder flexion to 90* and PROM abduction to 70*in supine.  Pt states she has been compliant with HEP, that spasms are less frequent and less intense and that she is able to use her LUE more functionally.     Manual Therapy   Manual Therapy Joint mobilization;Soft tissue mobilization   Manual therapy comments joint mob and soft tissue mob to hand and L shoulder to decreasd stiffness - pt states that when it rains she feels stiff all over.                   OT Short Term Goals - 01/07/16 1805    OT SHORT TERM GOAL #1   Title Pt and son will be mod I with HEP - 12/06/2015   Baseline dependent   Status Achieved   OT SHORT TERM GOAL #2   Title Pt will tolerate manual techniques from a pain standpoint to be able to address improving ROM in LUE to avoid further contracture and to reduce pain.   Baseline unable to tolerate   Status Achieved   OT SHORT TERM GOAL #3   Title Pt will be able to use  L hand as stabilizer for at least 2 functional tasks.   Baseline no functional use at this time   Status Achieved           OT Long Term Goals - 01/07/16 1805    OT LONG TERM GOAL #1   Title Pt and son will be mod I with upgraded HEP - 01/03/2016   Baseline dependent   Status On-going   OT LONG TERM GOAL #2   Title Assess current splint and revise as needed   Baseline Pt did not have splint on eval however reports current splint is hard to don   Status Achieved   OT LONG TERM GOAL #3   Title Pt will demonstrate increased PROM of shoulder flexion to 50* and shoulder abduction to 65* for ease in self care activities   Baseline shoulder flexion 40, abduction 60   Status Achieved   OT LONG TERM GOAL #4   Title Pt will report increased relaxation in LUE and decreased intensity in spasm of LUE to reduce pain and discmfort   Baseline Reports severe currently   Status Achieved               Plan - 01/07/16 1805    Clinical Impression  Statement Pt progressing toward goals and will be ready for discharge after next session.   Rehab Potential Fair   Clinical Impairments Affecting Rehab Potential severity of deficits.    OT Frequency 2x / week   OT Duration 8 weeks   OT Treatment/Interventions Self-care/ADL training;Electrical Stimulation;Moist Heat;Fluidtherapy;Ultrasound;Therapeutic exercise;Neuromuscular education;DME and/or AE instruction;Passive range of motion;Manual Therapy;Splinting;Therapeutic exercises;Therapeutic activities;Patient/family education   Plan finalize HEP, joint mob prn and NMR for active reach in LUE   Consulted and Agree with Plan of Care Patient      Patient will benefit from skilled therapeutic intervention in order to improve the following deficits and impairments:  Decreased range of motion, Decreased strength, Difficulty walking, Increased edema, Impaired UE functional use, Impaired tone, Increased muscle spasms, Pain  Visit Diagnosis: Pain in joint of left hand  Muscle weakness (generalized)  Unsteadiness on feet  Stiffness of left hand joint  Stiffness of left shoulder, not elsewhere classified  Pain in left shoulder    Problem List Patient Active Problem List   Diagnosis Date Noted  . 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 Tobacco abuse 08/16/2015  . Peripheral neuropathy ([place]) 07/17/2015  . Cervical cord compression with myelopathy ([place]) 02/08/2015  . Frozen shoulder--left 09/05/2014  . Unintentional weight loss 06/20/2014  . Health care maintenance 03/23/2012  . Hypertension associated with diabetes ([place]) 08/17/2006  . GERD 08/17/2006  . History of diabetes mellitus, type II 08/11/2006  . Hyperlipidemia associated with type 2 diabetes mellitus ([place]) 08/11/2006  . Osteoarthritis 08/11/2006    [name], OTR/L 01/07/2016, 6:08 PM  [facility] [address] [place], [place], [address] Phone:  [phone]   Fax:  [phone]  Name: Julie Elliott MRN: [id number] Date of Birth: October 08, 1955

## 2016-01-07 NOTE — Therapy (Signed)
[facility] [address] [place], [place], [address] Phone: [phone]   Fax:  [phone]  Physical Therapy Treatment  Patient Details  Name: Julie Elliott MRN: [id number] Date of Birth: February 06, 1956 Referring Provider: Sarina Ill, MD  Encounter Date: 01/07/2016      PT End of Session - 01/07/16 1804    Visit Number 7   Number of Visits 17   Date for PT Re-Evaluation 02/09/16   Authorization Type Medicare Traditional primary; Medicaid secondary   Authorization Time Period G Codes and PN required every 10 visits   PT Start Time 1450   PT Stop Time 1530   PT Time Calculation (min) 40 min      Past Medical History  Diagnosis Date  . Hypertension     well controlled  . DJD (degenerative joint disease)   . HLD (hyperlipidemia) 2008    stable, well controlled  . DM (diabetes mellitus) ([place]) 2008    stable HgBA1C at 6.5  . GERD (gastroesophageal reflux disease)     well controlled on Omeprazole  . Chronic headaches   . CHF (congestive heart failure) [facility])     Past Surgical History  Procedure Laterality Date  . Tubal ligation    . Carpel tunnel Right     20 yrs ago  . Cervical spine surgery  2016    There were no vitals filed for this visit.      Subjective Assessment - 01/07/16 1450    Subjective Pt reports no significant changes, no falls.    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 No/denies            [facility] PT Assessment - 01/07/16 0001    AROM   Right/Left Knee Right;Left   Right Knee Extension -17   Left Knee Extension -7   Right/Left Ankle Left   Left Ankle Dorsiflexion -10                      [facility] Adult PT Treatment/Exercise - 01/07/16 0001    Transfers   Transfers Sit to Stand;Stand to Sit   Sit to Stand 6: Modified independent (Device/Increase time)   Sit to Stand Details (indicate cue type and reason) Consistently demonstrates normalized BOS   Stand to Sit 6: Modified independent (Device/Increase time)   Ambulation/Gait   Ambulation/Gait Yes   Ambulation/Gait Assistance 5: Supervision   Ambulation/Gait Assistance Details x230' without AD with min guard, manual facilitation (progressed to tactile cueing) for full lateral weight shift to L; verbal cueing fof anterior weight shift during L stance/RLE advancement. Verbal cueing for increased step kength to promote full lateral weight shift to L side.   Ambulation Distance (Feet) 230 Feet   Assistive device None   Gait Pattern Step-through pattern;Decreased arm swing - left;Decreased stride length;Decreased hip/knee flexion - right;Decreased hip/knee flexion - left;Decreased dorsiflexion - left;Left foot flat;Right flexed knee in stance;Left flexed knee in stance;Decreased trunk rotation;Wide base of support;Decreased dorsiflexion - right;Decreased step length - right  limited B hip ext; ankle PF (L > R) during mid-term stance   Ambulation Surface Level;Indoor   Stairs Yes   Stairs Assistance 6: Modified independent (Device/Increase time)   Stairs Assistance Details (  indicate cue type and reason) Ascends forward-facing with RUE support at R rail; descends laterally with RUE at L rail.   Stair Management Technique One rail Right;Step to pattern;Sideways;Forwards   Number of Stairs 4   Height of Stairs 6   Ramp 5: Supervision   Ramp Details (indicate cue type and reason) using SBQC   Curb 5: Supervision;6: Modified independent (Device/increase time)   Curb Details (indicate cue type and reason) (S) to ascend; mod I to descend. During ascent.   Self-Care   Self-Care Other Self-Care Comments   Other Self-Care Comments  While pt  receiving moist heat on bilat hamstrings for increased relaxation, flexibility, PT educated pt on STG's, progress, and functional implications. Also discussed pt goals to ensure pt and therapy goals coincide. Pt verbalized understanding and was in full agreement.   Manual Therapy   Manual Therapy Joint mobilization   Joint Mobilization Mobilization with movement: A/P of L femur then R femur for increased knee extension ROM of respective LE. A/P of L talus to increase L ankle dorsiflexion ROM.                PT Education - 01/07/16 1803    Education provided Yes   Education Details STG's, progress, and functional implications. Discussed pt's functional goals and how these coincide with PT goals.    Person(s) Educated Patient   Methods Explanation   Comprehension Verbalized understanding          PT Short Term Goals - 01/07/16 1459    PT SHORT TERM GOAL #1   Title Pt will perform initial home stretching program with mod I using paper handout to indicate safe compliance with HEP.  (Target date: 01/08/16)   Status Achieved   PT SHORT TERM GOAL #2   Title Pt will improve active L ankle dorsiflexion from -12 degrees to -8 degrees to facilitate consistent L foot clearance during gait.  Modified due to therapist error on evaluation   Baseline 6/5: L ankle dorsiflexion AROM = -10 degrees   Status Revised   PT SHORT TERM GOAL #3   Title Pt will improve R knee extension AROM from -26 degrees to -20 degrees to progress toward more normalized gait pattern.    Baseline 6/1: R knee extension AROM = -19 degrees   Status Achieved   PT SHORT TERM GOAL #4   Title Pt will improve L knee extension AROM from -12 degrees to -7 degrees to progress toward more normalized gait pattern.    Baseline Met 6/5.   Status Achieved   PT SHORT TERM GOAL #5   Title Pt will consistently perform sit <> stand from standard chair with mod I, normal BOS, and symmetrical LE placement.   Baseline Met 6/5.   Status  Achieved   PT SHORT TERM GOAL #6   Title Pt will negotiate 4 stairs with R rail with mod I using LRAD to indicate pt safety using primary home entrance.    Baseline Met 6/5.   Status Achieved   PT SHORT TERM GOAL #7   Title Pt will negotiate standard ramp and curb step with supervision using LRAD to progress toward independence with community obstacle negotiation.    Baseline Met 6/5.   Status Achieved           PT Long Term Goals - 01/07/16 1806    PT LONG TERM GOAL #1   Title Pt will independently demo HEP to maximize functional gains made in PT.  (Target  date: 02/05/16)   PT LONG TERM GOAL #2   Title Pt will improve R knee extension AROM from -26 degrees to -15 degrees to promote more normalized gait pattern, increase energy/time efficiency of mobility.    PT LONG TERM GOAL #3   Title Pt will improve L knee extension AROM from -12 degrees to -2 degrees to promote more normalized gait pattern, increase energy/time efficiency of mobility.    PT LONG TERM GOAL #4   Title Pt will improve gait velocity from 0.86 ft/sec to >/= 1.29 ft/sec to indicate significantly increased efficiency of ambulation.   PT LONG TERM GOAL #5   Title Pt will improve TUG time from 38.57 seconds to </= 28.17 seconds to indicate significant improvement in efficiency of mobility.    PT LONG TERM GOAL #6   Title Pt will negotiate standard ramp and curb step with mod I using LRAD to indicate increased stability/independence with community obstacle negotiation.    PT LONG TERM GOAL #7   Title Pt will ambulate >300' over level and unlevel indoor and (paved) outdoor surfaces with mod I using LRAD to indicate increased safety with community mobility.                Plan - 01/07/16 1805    Clinical Impression Statement Session focused on assessing pt progress in PT. Pt met 6 of 7 STG's, suggesting significant improvement in B knee extensiot ROM and stability/independence with community obstacle negotiation,  stair negotiation, and HEP compliance. Only unmet STG was due to PT error on evaluation, as L ankle dorsiflexion AROM was documented as -2 degrees rather than -12 degrees. STG revised to reflect accurate AROM and realistic goal.    Rehab Potential Good   Clinical Impairments Affecting Rehab Potential Positive: intact cognition, pt motivation, h/o compliance, age, and family support. Negative: multiple contractures   PT Frequency 2x / week   PT Duration 8 weeks   PT Treatment/Interventions Orthotic Fit/Training;ADLs/Self Care Home Management;Gait training;Stair training;Functional mobility training;Therapeutic activities;Moist Heat;Electrical Stimulation;Therapeutic exercise;Balance training;Patient/family education;Neuromuscular re-education;Passive range of motion;Manual techniques   PT Next Visit Plan Continue heat and joint mobilizations (A/P of femur; A/P of L talus) for increased knee extension, ankle DF ROM.   Consulted and Agree with Plan of Care Patient      Patient will benefit from skilled therapeutic intervention in order to improve the following deficits and impairments:  Abnormal gait, Decreased knowledge of use of DME, Decreased balance, Decreased range of motion, Impaired flexibility, Postural dysfunction, Impaired tone, Decreased strength, Impaired UE functional use, Decreased coordination  Visit Diagnosis: Other abnormalities of gait and mobility  Spastic quadriparesis (HCC)  Contracture of muscle, multiple sites     Problem List Patient Active Problem List   Diagnosis Date Noted  . 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 Tobacco abuse 08/16/2015  . Peripheral neuropathy ([place]) 07/17/2015  . Cervical cord compression with myelopathy ([place]) 02/08/2015  . Frozen shoulder--left 09/05/2014  . Unintentional weight loss 06/20/2014  . Health care maintenance 03/23/2012  . Hypertension associated with diabetes ([place]) 08/17/2006  . GERD 08/17/2006  . History of diabetes  mellitus, type II 08/11/2006  . Hyperlipidemia associated with type 2 diabetes mellitus ([place]) 08/11/2006  . Osteoarthritis 08/11/2006    Billie Ruddy, PT, DPT [facility] [address] [place], [place], [address] Phone: [phone]   Fax:  [phone] 01/07/2016, 6:09 PM  Name: Julie Elliott MRN: [id number] Date of Birth: [birth year]

## 2016-01-08 ENCOUNTER — Ambulatory Visit: Payer: Medicare Other | Admitting: Occupational Therapy

## 2016-01-08 ENCOUNTER — Encounter: Payer: Self-pay | Admitting: Occupational Therapy

## 2016-01-08 ENCOUNTER — Ambulatory Visit: Payer: Medicare Other | Admitting: Physical Therapy

## 2016-01-08 DIAGNOSIS — M6249 Contracture of muscle, multiple sites: Secondary | ICD-10-CM

## 2016-01-08 DIAGNOSIS — R2689 Other abnormalities of gait and mobility: Secondary | ICD-10-CM

## 2016-01-08 DIAGNOSIS — M25512 Pain in left shoulder: Secondary | ICD-10-CM

## 2016-01-08 DIAGNOSIS — G8250 Quadriplegia, unspecified: Secondary | ICD-10-CM | POA: Diagnosis not present

## 2016-01-08 DIAGNOSIS — M25542 Pain in joints of left hand: Secondary | ICD-10-CM

## 2016-01-08 DIAGNOSIS — M25612 Stiffness of left shoulder, not elsewhere classified: Secondary | ICD-10-CM

## 2016-01-08 DIAGNOSIS — H8111 Benign paroxysmal vertigo, right ear: Secondary | ICD-10-CM | POA: Diagnosis not present

## 2016-01-08 DIAGNOSIS — M6281 Muscle weakness (generalized): Secondary | ICD-10-CM | POA: Diagnosis not present

## 2016-01-08 DIAGNOSIS — R2681 Unsteadiness on feet: Secondary | ICD-10-CM | POA: Diagnosis not present

## 2016-01-08 DIAGNOSIS — M25642 Stiffness of left hand, not elsewhere classified: Secondary | ICD-10-CM

## 2016-01-08 NOTE — Therapy (Signed)
[facility] [address] [place], [place], [address] Phone: [phone]   Fax:  [phone]  Occupational Therapy Treatment  Patient Details  Name: Julie Elliott MRN: [id number] Date of Birth: 1956-02-05 Referring Provider: Dr, Alysia Penna  Encounter Date: 01/08/2016      OT End of Session - 01/08/16 1752    Visit Number 12   Number of Visits 16   Date for OT Re-Evaluation 01/03/16   Authorization Type MCR primary, MCD secondary, will need G code and PN every 10th visit   Authorization Time Period 60 days   Authorization - Visit Number 12   Authorization - Number of Visits 12   OT Start Time 1616   OT Stop Time 2482   OT Time Calculation (min) 43 min   Activity Tolerance Patient tolerated treatment well      Past Medical History  Diagnosis Date  . Hypertension     well controlled  . DJD (degenerative joint disease)   . HLD (hyperlipidemia) 2008    stable, well controlled  . DM (diabetes mellitus) ([place]) 2008    stable HgBA1C at 6.5  . GERD (gastroesophageal reflux disease)     well controlled on Omeprazole  . Chronic headaches   . CHF (congestive heart failure) [facility])     Past Surgical History  Procedure Laterality Date  . Tubal ligation    . Carpel tunnel Right     20 yrs ago  . Cervical spine surgery  2016    There were no vitals filed for this visit.      Subjective Assessment - 01/08/16 1747    Subjective  I am going to a wedding this weekend   Patient is accompained by: Family member  son Julie Elliott   Pertinent History see epic   Patient Stated Goals I want my hands back   Currently in Pain? No/denies                      OT Treatments/Exercises (OP) - 01/08/16 1748    Neurological Re-education Exercises   Other Exercises 1 Neuro re ed to address closed chain bilateral mid reach in supine - addressed increasing shoulder flexion, shoulder abduction, improving core mobility  and control.  Finalized HEP and pt independent in all activities.    Manual Therapy   Manual Therapy Joint mobilization;Soft tissue mobilization   Manual therapy comments Joint mob, soft tissue mob to L shoulder girdle and L hand to reduce stiffness prior to neuro re ed.  Pt tolerated well                   OT Short Term Goals - 01/08/16 1751    OT SHORT TERM GOAL #1   Title Pt and son will be mod I with HEP - 12/06/2015   Baseline dependent   Status Achieved   OT SHORT TERM GOAL #2   Title Pt will tolerate manual techniques from a pain standpoint to be able to address improving ROM in LUE to avoid further contracture and to reduce pain.   Baseline unable to tolerate   Status Achieved   OT SHORT TERM GOAL #3   Title Pt will be able to use L hand as stabilizer for at least 2 functional tasks.   Baseline no functional use at this time   Status Achieved           OT Long Term Goals - 01/08/16 1751  OT LONG TERM GOAL #1   Title Pt and son will be mod I with upgraded HEP - 01/03/2016   Baseline dependent   Status Achieved   OT LONG TERM GOAL #2   Title Assess current splint and revise as needed   Baseline Pt did not have splint on eval however reports current splint is hard to don   Status Achieved   OT LONG TERM GOAL #3   Title Pt will demonstrate increased PROM of shoulder flexion to 50* and shoulder abduction to 65* for ease in self care activities   Baseline shoulder flexion 40, abduction 60   Status Achieved   OT LONG TERM GOAL #4   Title Pt will report increased relaxation in LUE and decreased intensity in spasm of LUE to reduce pain and discmfort   Baseline Reports severe currently   Status Achieved               Plan - 01/08/16 1751    Clinical Impression Statement Pt has met all goals and is ready for discharge.  Pt in agreement.   Rehab Potential Fair   Clinical Impairments Affecting Rehab Potential severity of deficits.    OT Frequency 2x / week    OT Duration 8 weeks   OT Treatment/Interventions Self-care/ADL training;Electrical Stimulation;Moist Heat;Fluidtherapy;Ultrasound;Therapeutic exercise;Neuromuscular education;DME and/or AE instruction;Passive range of motion;Manual Therapy;Splinting;Therapeutic exercises;Therapeutic activities;Patient/family education   Plan d/ from OT   Consulted and Agree with Plan of Care Patient      Patient will benefit from skilled therapeutic intervention in order to improve the following deficits and impairments:  Decreased range of motion, Decreased strength, Difficulty walking, Increased edema, Impaired UE functional use, Impaired tone, Increased muscle spasms, Pain  Visit Diagnosis: Other abnormalities of gait and mobility  Spastic quadriparesis (HCC)  Pain in joint of left hand  Stiffness of left hand joint  Stiffness of left shoulder, not elsewhere classified  Pain in left shoulder    Problem List Patient Active Problem List   Diagnosis Date Noted  . 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 Tobacco abuse 08/16/2015  . Peripheral neuropathy ([place]) 07/17/2015  . Cervical cord compression with myelopathy ([place]) 02/08/2015  . Frozen shoulder--left 09/05/2014  . Unintentional weight loss 06/20/2014  . Health care maintenance 03/23/2012  . Hypertension associated with diabetes ([place]) 08/17/2006  . GERD 08/17/2006  . History of diabetes mellitus, type II 08/11/2006  . Hyperlipidemia associated with type 2 diabetes mellitus ([place]) 08/11/2006  . Osteoarthritis 08/11/2006   OCCUPATIONAL THERAPY DISCHARGE SUMMARY  Visits from Start of Care: 12  Current functional level related to goals / functional outcomes: See above   Remaining deficits: Spastic quadraplegia   Education / Equipment: HEP  Plan: Patient agrees to discharge.  Patient goals were met. Patient is being discharged due to meeting the stated rehab goals.  ?????     [name], OTR/L 01/08/2016, 5:53  PM  [facility] [address] [place], [place], [address] Phone: [phone]   Fax:  [phone]  Name: Julie Elliott MRN: [id number] Date of Birth: October 15, 1955

## 2016-01-08 NOTE — Therapy (Signed)
[facility] [address] [place], [place], [address] Phone: [phone]   Fax:  [phone]  Physical Therapy Treatment  Patient Details  Name: Mandolin Falwell MRN: [id number] Date of Birth: 03-09-1956 Referring Provider: Sarina Ill, MD  Encounter Date: 01/08/2016      PT End of Session - 01/08/16 1651    Visit Number 8   Number of Visits 17   Date for PT Re-Evaluation 02/09/16   Authorization Type Medicare Traditional primary; Medicaid secondary   Authorization Time Period G Codes and PN required every 10 visits   PT Start Time 1530   PT Stop Time 1615   PT Time Calculation (min) 45 min   Activity Tolerance Patient tolerated treatment well   Behavior During Therapy [facility] for tasks assessed/performed      Past Medical History  Diagnosis Date  . Hypertension     well controlled  . DJD (degenerative joint disease)   . HLD (hyperlipidemia) 2008    stable, well controlled  . DM (diabetes mellitus) ([place]) 2008    stable HgBA1C at 6.5  . GERD (gastroesophageal reflux disease)     well controlled on Omeprazole  . Chronic headaches   . CHF (congestive heart failure) [facility])     Past Surgical History  Procedure Laterality Date  . Tubal ligation    . Carpel tunnel Right     20 yrs ago  . Cervical spine surgery  2016    There were no vitals filed for this visit.      Subjective Assessment - 01/08/16 1632    Subjective Pt reports no significant changes, no falls. Pt "felt pretty good" after yesterday's PT session.   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 No/denies                         [facility] Adult PT  Treatment/Exercise - 01/08/16 0001    Transfers   Transfers Sit to Stand;Stand to Sit   Sit to Stand 6: Modified independent (Device/Increase time)   Sit to Stand Details (indicate cue type and reason) wide BOS only with sit > stand from very low mat table   Stand to Sit 6: Modified independent (Device/Increase time)   Ambulation/Gait   Ambulation/Gait Yes   Ambulation/Gait Assistance 5: Supervision   Ambulation/Gait Assistance Details x325' without AD with cueing for increased B step length, increased reciprocal arm swing (focus on L), increased trunk rotation  effective between-session carryover of inc R step length   Ambulation Distance (Feet) 325 Feet   Assistive device None   Gait Pattern Step-through pattern;Decreased arm swing - left;Decreased stride length;Decreased hip/knee flexion - right;Decreased hip/knee flexion - left;Decreased dorsiflexion - left;Left foot flat;Right flexed knee in stance;Left flexed knee in stance;Decreased trunk rotation;Wide base of support;Decreased dorsiflexion - right  limited B hip ext; ankle PF (L > R) during mid-term stance   Ambulation Surface Level;Indoor   Exercises   Exercises Other Exercises   Other Exercises  B iliopsoas, rectus femoris self-stretch by placing respective LE on second step (12" from ground) and contralateral LE on ground, single UE support 2 x60-sec holds per side with cueing to lift head/chest and lean forward.  Manual Therapy   Manual Therapy Joint mobilization   Manual therapy comments Moist heat applied to R hamstrings while LLE mobilizations being performed and at L hamstrings, L ankle while RLE mobilizations being performed.   Joint Mobilization Joint mobilizations of the following joints (6 reps x10-second holds each): A/P of L femur (grades I-IV), A/P of R femur (grades I-IV) for increased knee extension ROM. A/P of L talus (grades I-IV), A/P of distal tibiofibular joint (grades I-III) to increase L ankle dorsiflexion ROM;  grades I-III AP/PA oscillation of L tibiofemoral joint 20 seconds x2.                 PT Education - 01/07/16 1803    Education provided Yes   Education Details STG's, progress, and functional implications. Discussed pt's functional goals and how these coincide with PT goals.    Person(s) Educated Patient   Methods Explanation   Comprehension Verbalized understanding          PT Short Term Goals - 01/07/16 1459    PT SHORT TERM GOAL #1   Title Pt will perform initial home stretching program with mod I using paper handout to indicate safe compliance with HEP.  (Target date: 01/08/16)   Status Achieved   PT SHORT TERM GOAL #2   Title Pt will improve active L ankle dorsiflexion from -12 degrees to -8 degrees to facilitate consistent L foot clearance during gait.  Modified due to therapist error on evaluation   Baseline 6/5: L ankle dorsiflexion AROM = -10 degrees   Status Revised   PT SHORT TERM GOAL #3   Title Pt will improve R knee extension AROM from -26 degrees to -20 degrees to progress toward more normalized gait pattern.    Baseline 6/1: R knee extension AROM = -19 degrees   Status Achieved   PT SHORT TERM GOAL #4   Title Pt will improve L knee extension AROM from -12 degrees to -7 degrees to progress toward more normalized gait pattern.    Baseline Met 6/5.   Status Achieved   PT SHORT TERM GOAL #5   Title Pt will consistently perform sit <> stand from standard chair with mod I, normal BOS, and symmetrical LE placement.   Baseline Met 6/5.   Status Achieved   PT SHORT TERM GOAL #6   Title Pt will negotiate 4 stairs with R rail with mod I using LRAD to indicate pt safety using primary home entrance.    Baseline Met 6/5.   Status Achieved   PT SHORT TERM GOAL #7   Title Pt will negotiate standard ramp and curb step with supervision using LRAD to progress toward independence with community obstacle negotiation.    Baseline Met 6/5.   Status Achieved            PT Long Term Goals - 01/07/16 1806    PT LONG TERM GOAL #1   Title Pt will independently demo HEP to maximize functional gains made in PT.  (Target date: 02/05/16)   PT LONG TERM GOAL #2   Title Pt will improve R knee extension AROM from -26 degrees to -15 degrees to promote more normalized gait pattern, increase energy/time efficiency of mobility.    PT LONG TERM GOAL #3   Title Pt will improve L knee extension AROM from -12 degrees to -2 degrees to promote more normalized gait pattern, increase energy/time efficiency of mobility.    PT LONG TERM GOAL #4   Title Pt will  improve gait velocity from 0.86 ft/sec to >/= 1.29 ft/sec to indicate significantly increased efficiency of ambulation.   PT LONG TERM GOAL #5   Title Pt will improve TUG time from 38.57 seconds to </= 28.17 seconds to indicate significant improvement in efficiency of mobility.    PT LONG TERM GOAL #6   Title Pt will negotiate standard ramp and curb step with mod I using LRAD to indicate increased stability/independence with community obstacle negotiation.    PT LONG TERM GOAL #7   Title Pt will ambulate >300' over level and unlevel indoor and (paved) outdoor surfaces with mod I using LRAD to indicate increased safety with community mobility.                Plan - 01/08/16 1654    Clinical Impression Statement Session focused on continued use of joint mobilizations to promote B knee extension and L ankle dorsiflexion. Noted effective between-session carryover of normalized/symmetrical weight shift during gait.    Rehab Potential Good   Clinical Impairments Affecting Rehab Potential Positive: intact cognition, pt motivation, h/o compliance, age, and family support. Negative: multiple contractures   PT Frequency 2x / week   PT Duration 8 weeks   PT Treatment/Interventions Orthotic Fit/Training;ADLs/Self Care Home Management;Gait training;Stair training;Functional mobility training;Therapeutic activities;Moist  Heat;Electrical Stimulation;Therapeutic exercise;Balance training;Patient/family education;Neuromuscular re-education;Passive range of motion;Manual techniques   PT Next Visit Plan Continue heat and joint mobilizations (A/P of femur; A/P of L talus, A/P of distal tibfib joint) for increased knee extension, ankle DF ROM. Try standing frame for hip flexor stretch in WB.   Consulted and Agree with Plan of Care Patient      Patient will benefit from skilled therapeutic intervention in order to improve the following deficits and impairments:  Abnormal gait, Decreased knowledge of use of DME, Decreased balance, Decreased range of motion, Impaired flexibility, Postural dysfunction, Impaired tone, Decreased strength, Impaired UE functional use, Decreased coordination  Visit Diagnosis: Contracture of muscle, multiple sites  Other abnormalities of gait and mobility  Spastic quadriparesis [facility])     Problem List Patient Active Problem List   Diagnosis Date Noted  . Spastic quadriparesis ([place]) 11/02/2015  . Tobacco abuse 08/16/2015  . Peripheral neuropathy ([place]) 07/17/2015  . Cervical cord compression with myelopathy ([place]) 02/08/2015  . Frozen shoulder--left 09/05/2014  . Unintentional weight loss 06/20/2014  . Health care maintenance 03/23/2012  . Hypertension associated with diabetes ([place]) 08/17/2006  . GERD 08/17/2006  . History of diabetes mellitus, type II 08/11/2006  . Hyperlipidemia associated with type 2 diabetes mellitus ([place]) 08/11/2006  . Osteoarthritis 08/11/2006    Billie Ruddy, PT, DPT [facility] [address] [place], [place], [address] Phone: [phone]   Fax:  [phone] 01/08/2016, 4:57 PM  Name: Kehinde Totzke MRN: [id number] Date of Birth: [birth year]

## 2016-01-10 ENCOUNTER — Encounter: Payer: Self-pay | Admitting: Neurology

## 2016-01-10 ENCOUNTER — Ambulatory Visit (INDEPENDENT_AMBULATORY_CARE_PROVIDER_SITE_OTHER): Payer: Medicare Other | Admitting: Neurology

## 2016-01-10 VITALS — BP 132/87 | HR 109 | Ht 63.0 in | Wt 170.6 lb

## 2016-01-10 DIAGNOSIS — E785 Hyperlipidemia, unspecified: Secondary | ICD-10-CM | POA: Diagnosis not present

## 2016-01-10 DIAGNOSIS — G8250 Quadriplegia, unspecified: Secondary | ICD-10-CM

## 2016-01-10 DIAGNOSIS — R6889 Other general symptoms and signs: Secondary | ICD-10-CM | POA: Diagnosis not present

## 2016-01-10 DIAGNOSIS — G8110 Spastic hemiplegia affecting unspecified side: Secondary | ICD-10-CM

## 2016-01-10 NOTE — Patient Instructions (Addendum)
-   Will discuss with Benjie Karvonen plans for continuing PT and orthosis for the foot - Dr. Letta Pate for recommendation on botox possibly in the left leg - Hospital mattress, will request - labs  I would like to see you back in 4-6 months, sooner if we need to. Please call us with any interim questions, concerns, problems, updates or refill requests.   Our phone number is [phone]. We also have an after hours call service for urgent matters and there is a physician on-call for urgent questions. For any emergencies you know to call 911 or go to the nearest emergency room

## 2016-01-10 NOTE — Progress Notes (Signed)
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   Interval history 01/10/2016: Patient feels like she has been doing excellent. She is walking much better, she loves physical therapy, she is getting better. Botox with Julie Elliott is significantly helping with range of motion and pain in her left arm. She is working with Julie Elliott in physical therapy.  Using a cane but can walk without it. She is doing great with botox. Lyrica helps at night. Tizanidine helps in the morning takes it once a day with breakfast for spasticity but doesn't want to take any more than that due to side effects. She still has difficulty due to her weakness and spasticity with changing positions in bed, and this week she had an irritation on her back which was treated concerning for skin erosion. Discussed mattresses that can help prevent skin erosion and ulcers. Will request a mattress to try and avoid bed sores. She has a irritation in the lower back now healing, worried about bed sores and will try and request a full size mattress to address this problem. Will talk to [facility]. Patient is here with her son as well. Her weight has stabilized. Son provides information. Had a long talk about what we can do in the future including continuing Botox, continued physical therapy, stretching and strengthening, other medications to help with spasticity and pain, and most important mattress to help address and prevent possible bed sores. Physical therapy ordered an orthosis for her left foot due to her abnormal gait because of foot inversion due to myelopathy, we'll discuss with Julie Elliott and possibly he can help with Botox..   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 She is doing exceptionally well. The spasticity is improving and so it the pain. She still has balance problems but is up walking around with a cane, no falls. Have recommended baclofen for the spasticity but she is worried about taking it. I also recommend Rehab with Julie Elliott and Julie Elliott as well as botox for the left arm spasticity. The weight loss has stabilized. She needs podiatry for her toe nail problems. I am extremely pleased with her progress after surgery with Julie Elliott at [facility], a remarkable improvement. She has some spasms in the left arm and both legs. The spasms are less with the Lyrica. The Baclofen didn't help.Her right leg has gotten weaker. She lives with her son who cares for her. Sleeping well at night. She has lost weight. She has spasms mostly in the left arm and mostly in the hand. She needs PT, there was an error in the referral to [facility] and Julie Elliott which has been corrected. Discussed spasticity with patient. Have prescribed baclofen for patient but she is not interested in taking it, even though it can help with her spasticity. Discussed increasing baclofen and she declines, it makes her sleepy and difficult to walk. She is urinating fine, but bowel movements are difficult  Interval update 04/15/2015: Julie Elliott is a 60 y.o. female here as a referral from Julie Elliott for progressive gait ataxia, left hand pain and weakness. [name] is a 60yo woman with PMHx of HTN, systolic CHF, GERD, Type 2 DM, and  hyperlipidemia who presents today for the following: Progressive spasticity, weakness, falls, painful paresthesias unintentional significant weight loss of over 100 pounds, decreased mobility. MRI of the cervical cord is significant for T2 hyperintense lesion extending from C4-C7 with focal spinal cord enhancement at C5-C6. She has gone through extensive workup and returns today to review, with son. Repeat MRI of the  cervical spine, see below, consistent with compressive myelopathy of the spinal cord due to spondylosis. She is gone through extensive testing (see below) including a nuclear medicine PET scan whose findings were largely unremarkable except for asymmetric metabolic activity in the RIGHT vallecula region of the hypopharynx para the RIGHT (will send for direct visualization to rule out head and neck malignancy with Julie Elliott), CT of the chest abdomen and pelvis were largely unremarkable and no malignancy was found. Extensive serum testing includes the following unremarkable labs: CMP, CBC, TSH, sedimentation rate, HIV with negative reflex, ANA with rheumatologic comprehensive panel, vitamin B1, heavy metals in the blood, pan-ANCA panel, multiple myeloma panel, vitamin B6, rheumatoid factor, paraneoplastic profile, high sensitivity CRP, hep C antibody, RPR,, B12 and folate, angiotensin-converting enzyme, hemoglobin A1c, CK, NMO, copper, ceruloplasmin, HTLV-1 and 2 antibodies, JCV,   CSF studies were completed which include normal myelin basic protein, positive CSF oligoclonal pans greater than 5 that are not present in the patient's corresponding serum sample,negative Lyme antibodies, normal CNS IgG index, negative Echo virus antibodies, a high index of varus sellar zoster IgG in the CSF was found at 2518 (upper limit normal of 135) however these antibodies were also found in an higher quantity the serum and so this is not consistent with intrathecal production, negative Epstein-Barr PCR, negative VDRL, negative CMV PCR, negative HSV PCR, still pending VZV DNA PCR, fungus culture with smear in progress, cell count with differential unremarkable, normal glucose and elevated protein.   Repeat MRI cervical cord: FINDINGS: :  On sagittal images, the spine is imaged from above the cervicomedullary junction to T3T4. There is increased signal within the spinal cord from C4-C5 to C7 on T2-weighted and STIR images.  There is focal enlargement of the spinal cord adjacent to C5C6 and C6. There is relatively flat enhancement within this region only adjacent to C5C6.   The vertebral bodies are normally aligned. The vertebral bodies have normal signal. The discs and interspaces were further evaluated on axial views from C2 to T1 as follows: C2 - C3: The disc and interspace appear normal. C3 - C4: There is mild uncovertebral spurring and disc bulging causing moderate bilateral foraminal narrowing. There is no definite nerve root compression, but there is some encroachment of the exiting C4 nerve roots. C4 - C5: There is mild bilateral uncovertebral spurring and imaging causing severe bilateral foraminal narrowing. There could be compression of either of the C5 nerve roots. Posteriorly within the spinal cord, there is hyperintense signal on T2 images extending inferiorly.  C5 - C6: There is moderate spinal stenosis due to broad disc protrusion, uncovertebral spurring and facet hypertrophy. There is severe left and very severe right foraminal narrowing leading to right C6 and possible left C6 nerve root compression. The entire spinal cord is hyperintense C6 - C7: There is broad disc protrusion and facet hypertrophy causing severe left and moderate right foraminal narrowing. There is possible left C7 nerve root compression. Abnormal signal is seen in the entire spinal cord at the interspace and medially adjacent to the C7 vertebral body.  C7 - T1: The disc and interspace appear normal.  Hyperintense signal is noted medially and anteriorly only, within the spinal cord T1 - T2: The disc and interspace appear normal.   Compared to the MRI dated 03/13/2015, there is no definite change.   IMPRESSION: This is an abnormal MRI of the cervical spine with and without contrast showing: 1. Abnormal T2 hyperintense signal within the spinal cord from C4C5 to C7 with mild expansion of the cord  adjacent to C5C6 to C6. There is "pancake-like" flat enhancement within the spinal cord only adjacent to C5C6. The findings are stable compared to 03/13/2015. 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 Inflammatory, demyelinating and neoplastic etiologies remain in the differential diagnosis 2. At [place], there is severe foraminal narrowing bilaterally that could lead to compression of either c5 nerve root. 3. At C5C6, there is moderate spinal stenosis and very severe right and severe left foraminal narrowing. There is probable right and possible left C6 nerve root compression.  4. At C6C7, there is severe left foraminal narrowing with possible left C7 nerve root compression  An extensive workup has been completed on patient without another etiology found except for possible compressive spondylosis of the spine. Workup consisted of  Nuclear medicine PET image: FINDINGS: NECK There is increased activity within the spinal cord from the skullbase through C7. There is focal activity at C5-C6 which corresponds to the lesion on comparison MRI. No hypermetabolic cervical lymphadenopathy.   There is asymmetric metabolic activity within the RIGHT vallecula compared to the LEFT (image 24 of the fused data set). This activity is intense with SUV max equal 11.6 compared to prior wall 7.0 on the contralateral side. No discrete mass lesions identified.   Here is a focus of activity at the LEFT small first rib and clavicle junction (image 48 of fused data set) without associated finding on the CT portion.  CHEST  No hypermetabolic mediastinal or hilar nodes. 4 mm on palm lung nodule along the RIGHT oblique fissure on image (image 25 with, series 6).  ABDOMEN/PELVIS  No abnormal hypermetabolic activity within the  liver, pancreas, adrenal glands, or spleen. No hypermetabolic lymph nodes in the abdomen or pelvis. Nonobstructing calculus in the LEFT kidney measures 7 mm. Uterus and ovaries are normal.  SKELETON  No focal hypermetabolic activity to suggest skeletal metastasis.  IMPRESSION: 1. Hypermetabolic focus within the spinal cord corresponds to enhancing lesion on comparison cervical MRI. Differential includes inflammatory process, infectious process, or neoplastic process.). 2. Asymmetric metabolic activity in the RIGHT vallecula region of the hypopharynx para the RIGHT. Consider direct visualization if concern for head and neck malignancy. 3. Asymmetric uptake at the junction of the first rib LEFT clavicle is likely degenerative as there is no lesion identified on comparison CT 4. Small nodule along the RIGHT oblique fissure is likely benign. If the patient is at high risk for bronchogenic carcinoma, follow-up chest CT at 1 year is recommended. If the patient is at low risk, no follow-up is needed. This recommendation follows the consensus statement: Guidelines for Management of Small Pulmonary Nodules Detected on CT Scans: A Statement from the [place] as published in Radiology 2005; 237:395-400.  CT ABDOMEN PELVIS FINDINGS  Hepatobiliary: Cholecystectomy.  Pancreas: Unremarkable  Spleen: Unremarkable  Adrenals/Urinary Tract: 6 mm left mid kidney nonobstructive renal calculus.  Stomach/Bowel: Small periampullary duodenal diverticulum.  Vascular/Lymphatic: Mild aortoiliac atherosclerotic calcification.  Reproductive:  Unremarkable  Other: No supplemental non-categorized findings.  CT of the chest, abdomen and pelvis with contrast  Musculoskeletal: Considerable lower lumbar spondylosis and degenerative disc disease thought to be causing impingement at ultiple lumbar levels but most particularly at L5-S1 and L4-5. This appears significantly progressive compared to  the exam from 09/14/2006. High density along the anterior epidural tissues at the L5 level probably from disc material or venous plexus. Spurring of both hips noted.  IMPRESSION: 1. 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 2. The 2-3 mm right upper lobe subpleural lymph node, unchanged from 09/05/2014. This documents 6 months of stability. If the patient is at high risk for bronchogenic carcinoma, follow-up chest CT in 6 months is recommended. If the patient is at low risk, no follow-up is needed. This recommendation follows the consensus statement: Guidelines for Management of Small Pulmonary Nodules Detected on CT Scans: A Statement from the [place] as published in Radiology 2005; 237:395-400. 3. 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 4. 6 mm nonobstructive left mid kidney renal calculus. 5. Mild atherosclerosis.  MR Brain:  IMPRESSION: Abnormal MRI scan of the brain showing tiny scattered periventricular and subcortical nonspecific white matter hyperintensities with differential discussed above. No enhancing lesions are noted.  MRI Thoracic spine; Unremarkable  Interval update 03/15/2015: Julie Elliott is a 60 y.o. female here as a referral from Julie Elliott for progressive gait ataxia, left hand pain and weakness. Ms. Neuenfeldt is a 60yo woman with PMHx of HTN, systolic CHF, GERD, Type 2 DM, and hyperlipidemia who presents today for the following: Progressive spasticity, weakness, falls, painful paresthesias unintentional significant weight loss decreased mobility. She was scheduled for an EMG nerve conduction today however MRI of the cervical cord today came back significant for T2 hyperintense lesion extending from C4-5 to inferior C7 level with focal spinal cord enhancement at C5-6 level. The spinal cord appears focally enlarged at the C6 level. May represent focal  autoimmune, inflammatory, demyelinating, neoplastic or para-infectious etiologies. There is adjacent degenerative disc disease with mild spinal stenosis, and therefore compressive etiology may be considered. However the degree of abnormal cord signal appears to be out of proportion to the degree of compression, and therefore would consider workup of other etiologies.  Discussed at length with patient and son and further workup.  Interval update 03/01/2015: Julie Elliott is a 60 y.o. female here as a follow up. Here with her son.   She is still losing weight. She lost 12-14 pounds in a 2 week period. She is losing muscle mass now. She can't stand up. She can't walk for a long period of time. Her PT and OT was discontinued. Patient reports that they told her they were wasting her time. No incontinence. No problems swallowing. No PBA. No weakness in facial muscles. No muscle cramping. Still with left-sided arm pain and weakness. Continuous pain. Stiffness in the legs.   Initial visit 12/21/2014: Julie Elliott is a 60 y.o. female here as a referral from Julie Elliott for progressive gait ataxia, left hand pain and weakness. Ms. Sturgeon is a 60yo woman with PMHx of HTN, systolic CHF, GERD, Type 2 DM, and hyperlipidemia who presents today for the following:  She is having problems with the left hand. She has joint pain and out of nowhere the hand locked up. Stiffness, arthritic-type pain. Doesn't stop when she sleeps, the pain is continuous all day lone. Progressing  and worsening. Left leg is weak. Also burning in the left leg. And coldness in both legs. They were in the ED January first, she felt like she couldn't move. Her legs were cold. She has lost a lot of weight quickly, over 100 pounds in the last 6 months. She eats the same, good appetite. she was told that her symptoms were related to arthritis and diabetic neuropathy. One month later they were in the ED again, decreased mobility and ataxia. Now she needs a cane  to walk. Issues with balance and near falls. She has developed a frozen shoulder. Progressive, incapaciting pain 10/10 with difficulty performing daily tasks such as cleaning, toileting, bathing, cooking. Symptoms are mostly on the left side but her right leg is now affected. Assymmetric. Right leg slightly involved. Weakness as well as pain mostly on the left. She gets very hot. The left hand symptoms were acute, the other symptoms have been insidious and have slowly progressed. She has to have someone bathe her now. Balance is poor. She has not fallen but very off balance. No injuries, no inciting factors. She had stoped taking her insulin in December because of her weight loss she no longer needs it. Son is resent with her and provides much of the information.   EMG/NCS showed severe CTS. Had emg/ncs at [facility] orthopaedics.   Reviewed notes, labs and imaging from outside physicians, which showed:  DG Lumbar spine (reviewed images and agree with the following) : No evidence for fracture. Loss of disc height is seen at L3-4 and L5-S1 advanced facet osteoarthritis is seen bilaterally in the lower lumbar levels. No worrisome lytic or sclerotic osseous abnormality.  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  DG Cervical Spine: The cervicothoracic junction is partially obscured by osseous and soft tissue overlap on the lateral view. Cervical spine alignment is maintained. Vertebral body heights are preserved. There is disc space narrowing at C4-C5, C5-C6, and C6-C7 with associated endplate spurs. There is bony neural foraminal narrowing at C5-C6 bilaterally. The dens is intact. Posterior elements appear well-aligned. There is no evidence of fracture. No prevertebral soft tissue edema.  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 Bony neural foraminal narrowing at C5-C6. No acute bony abnormality.  DG Hip: There is no evidence of fracture. There  is osteoarthritis of both hips with mild joint space narrowing and marginal osteophytes. Sacroiliac joints appear normal. The patient has a tendency towards calcification at tendon insertions.  IMPRESSION: No acute finding. Osteoarthritis of both hips.  DG Left hand complete: Evaluation of the digits is limited due to flexed positioning. No fracture or dislocation. Mild osteoarthritis of the thumb and index inger metacarpal phalangeal joints with joint space narrowing and osteophytes. The joint spaces are otherwise maintained. There are no erosions or periosteal reaction. No focal soft tissue abnormality.  DG Shoulder: Mild osteoarthritis. No evidence of inflammatory arthropathy or acute bony abnormality.  No fracture or dislocation. The alignment and joint spaces are maintained. There is mild osteoarthritis at the acromioclavicular joint. There is inferior spurring from the glenoid. No definite abnormal soft tissue calcifications.  IMPRESSION: Acromioclavicular and glenohumeral osteoarthritis. No acute bony abnormality.  She was seen in the ED in February for shoulder pain and limitations in movement. She was referred to Orthopaedics. Per notes, She also has hx of leg pain, knee pain, and hip pain which has been attributed to diabetic neuropathy and arthritis in the past. Has unbalance. CXR hip showed OA of both  hips and mild joint space narrowing and osteophytes. SI joint was normal. Lumbar spine, degeneration disc And facet disease onf L3-L4, L5-S1. 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 Exam noted Patient's legs are cool to the touch. Bilateral ABI was within normal limits. Dxed with likely peripheral neuropathy from her diabetes and possible decreased arterial blood  flow to the legs.  HgbA1c 6.8 06/2014   Social History   Social History  . Marital Status: Single    Spouse Name: N/A  . Number of Children: 3  . Years of Education: 12   Occupational History  . Unemployed    Social History Main Topics  . Smoking status: Current Some Day Smoker -- 0.30 packs/day    Types: Cigarettes    Last Attempt to Quit: 04/18/2013  . Smokeless tobacco: Not on file  . Alcohol Use: No  . Drug Use: No  . Sexual Activity: Not on file   Other Topics Concern  . Not on file   Social History Narrative   to get supplies nfrom [facility] per pt request.1--[phone]   Lives at home with son.    Right handed.   Caffeine use: drinks coffee-rare    Family History  Problem Relation Age of Onset  . Diabetes Mother   . Heart disease Mother   . Prostate cancer Father   . Neuropathy Neg Hx     Past Medical History  Diagnosis Date  . Hypertension     well controlled  . DJD (degenerative joint disease)   . HLD (hyperlipidemia) 2008    stable, well controlled  . DM (diabetes mellitus) ([place]) 2008    stable HgBA1C at 6.5  . GERD (gastroesophageal reflux disease)     well controlled on Omeprazole  . Chronic headaches   . CHF (congestive heart failure) [facility])     Past Surgical History  Procedure Laterality Date  . Tubal ligation    . Carpel tunnel Right     20 yrs ago  . Cervical spine surgery  2016    Current Outpatient Prescriptions  Medication Sig Dispense Refill  . aspirin 81 MG EC tablet Take 1 tablet (81 mg total) by mouth daily. 90 tablet 1  . Calcium-Vitamin D 600-125 MG-UNIT TABS Take 1 tablet by mouth daily. 90 each 1  . pregabalin (LYRICA) 75 MG capsule Take 1 capsule (75 mg total) by mouth 2 (two) times daily. 60 capsule 12  . senna-docusate (SENOKOT-S) 8.6-50 MG tablet Take 2 tablets by mouth.    . simvastatin (ZOCOR) 40 MG tablet TAKE 1 TABLET ONCE DAILY. 90 tablet 4  . tiZANidine (ZANAFLEX) 2 MG tablet Take 1 tablet (2 mg total) by  mouth 3 (three) times daily. 90 tablet 1   No current facility-administered medications for this visit.    Allergies as of 01/10/2016 - Review Complete 01/10/2016  Allergen Reaction Noted  . Loratadine-pseudoephedrine er Hives     Vitals: BP 132/87 mmHg  Pulse 109  Ht '5\' 3"'  (1.6 m)  Wt 170 lb 9.6 oz (77.384 kg)  BMI 30.23 kg/m2 Last Weight:  Wt Readings from Last 1 Encounters:  01/10/16 170 lb 9.6 oz (77.384 kg)   Last Height:   Ht Readings from Last 1 Encounters:  01/10/16 '5\' 3"'  (1.6 m)  Speech:  Speech is normal; fluent and spontaneous with normal comprehension.  Cognition:  The patient is oriented to person, place, and time;   recent and remote memory intact;   language fluent;   normal attention, concentration,   fund of knowledge Cranial Nerves:  The pupils are equal, round, and reactive to light. The fundi are flat. Visual fields are full to finger confrontation. Extraocular movements are intact. Trigeminal sensation is intact and the muscles of mastication are normal. The face is symmetric. The palate elevates in the midline. Hearing intact. Voice is normal. Shoulder shrug is normal. The tongue has normal motion without fasciculations.   Coordination:  No dysmetria with right arm, difficult to perform with other extremities due to weakness  Gait:  spastic gait, slow, walking without a cane today but needs help from her son, noticeable left leg inversion with walking.  Motor Observation: assymetry of the left lower extremitiy without edema, left ankle larger circumference, some muscle wasting.  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 Mild increased tone right arm.  Posture:  Posture is mildly stooped   Strength: 4/5 strength  proximally in the right arm with increased tone. 3/5 hip flexion weakness bilaterally with bilateral lower extremity spasticity, left arm weakness proximally 3/5 with significant spasticity more distally, decreased ROM left arm proximally (improved with pectoral muscle botox). Right lower extremity 4 out of 5 weakness in the long tract distribution of hip flexion knee extension and dorsiflexion, the left lower extremity mildly weaker in the same distribution.      Sensation: right leg decreased pp to mid calf Left leg decreased pp to the ankle Vibration 3 seconds on the left and 5 second on the right at thegreat toes Proprioception intact bilaterally Decreased temp to below knees bilaterally    Reflex Exam:  DTR's: Brick biceps, patellars and achilles for age and medical conditions   Toes:  Left toe upgoing  Clonus:  Clonus is absent.    Assessment/Plan: Julie Elliott is a wonderful 60 y.o. female here as a referral from Julie Elliott for progressive gait ataxia and spasticity, left hand pain and paresis, lower extremity bilateral spastic paresis. Progressive spasticity, weakness, falls, painful paresthesias unintentional significant weight loss of over 100 pounds ,decreased mobility. Exam with decreased sensation distally, weakness necessitating wheelchair, increased tone in the lower extremities with left upgoing toe, very brisk reflexes for age and medical conditions. 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   -Patient needs a new mattress due to skin abrasion on the back and concern for decubital ulcers, we'll contact advanced home care. - We'll discuss with Julie Elliott physical therapy continued plans  - Continue with Julie Elliott and Julie. Naaman Elliott for  physical rehabilitation as well as evaluation for Botox for spasticity.Will see if Julie Elliott thinks botox of the distal left leg will help with gait. - Have tried Baclofen for her spasticity, she felt it made her too tired. Discussed valium for her spasms, she declines. She needs continued physical therapy. Continue tizanidine and Lyrica daily.  - continue Lyrica for pain - For neurogenic bowel, I have  recommended suppository at 9am when her aid is there. She takes oral stool softener. Discussed enemas too.  - We will check labs today.  [facility] [address] [place], [place] [address]  Phone [phone] Fax [phone]  A total of 60 minutes was spent face-to-face with this patient. Over half this time was spent on counseling patient on the cervical myelopathy and spastic quadriparesis diagnosis and different diagnostic and therapeutic options available.

## 2016-01-11 LAB — THYROID PANEL WITH TSH
Free Thyroxine Index: 2.2 (ref 1.2–4.9)
T3 Uptake Ratio: 25 % (ref 24–39)
T4 TOTAL: 8.8 ug/dL (ref 4.5–12.0)
TSH: 1.1 u[IU]/mL (ref 0.450–4.500)

## 2016-01-11 LAB — COMPREHENSIVE METABOLIC PANEL
ALK PHOS: 78 IU/L (ref 39–117)
ALT: 11 IU/L (ref 0–32)
AST: 13 IU/L (ref 0–40)
Albumin/Globulin Ratio: 1.3 (ref 1.2–2.2)
Albumin: 4 g/dL (ref 3.5–5.5)
BUN/Creatinine Ratio: 16 (ref 9–23)
BUN: 11 mg/dL (ref 6–24)
Bilirubin Total: 0.3 mg/dL (ref 0.0–1.2)
CALCIUM: 9 mg/dL (ref 8.7–10.2)
CHLORIDE: 106 mmol/L (ref 96–106)
CO2: 22 mmol/L (ref 18–29)
Creatinine, Ser: 0.7 mg/dL (ref 0.57–1.00)
GFR calc Af Amer: 111 mL/min/{1.73_m2} (ref >59)
GFR calc non Af Amer: 96 mL/min/{1.73_m2} (ref >59)
GLUCOSE: 84 mg/dL (ref 65–99)
Globulin, Total: 3 g/dL (ref 1.5–4.5)
Potassium: 4.5 mmol/L (ref 3.5–5.2)
Sodium: 144 mmol/L (ref 134–144)
TOTAL PROTEIN: 7 g/dL (ref 6.0–8.5)

## 2016-01-11 LAB — CBC
HEMATOCRIT: 42.4 % (ref 34.0–46.6)
HEMOGLOBIN: 14.4 g/dL (ref 11.1–15.9)
MCH: 30.1 pg (ref 26.6–33.0)
MCHC: 34 g/dL (ref 31.5–35.7)
MCV: 89 fL (ref 79–97)
Platelets: 197 10*3/uL (ref 150–379)
RBC: 4.78 x10E6/uL (ref 3.77–5.28)
RDW: 14.1 % (ref 12.3–15.4)
WBC: 5.1 10*3/uL (ref 3.4–10.8)

## 2016-01-11 LAB — LIPID PANEL
CHOL/HDL RATIO: 2.1 ratio (ref 0.0–4.4)
Cholesterol, Total: 140 mg/dL (ref 100–199)
HDL: 67 mg/dL (ref >39)
LDL Calculated: 62 mg/dL (ref 0–99)
TRIGLYCERIDES: 56 mg/dL (ref 0–149)
VLDL Cholesterol Cal: 11 mg/dL (ref 5–40)

## 2016-01-14 ENCOUNTER — Telehealth: Payer: Self-pay | Admitting: *Deleted

## 2016-01-14 NOTE — Telephone Encounter (Signed)
I have spoken with Julie Elliott this morning, and per AA, advised that labs done in our office look normal.  She verbalized understanding of same/fim

## 2016-01-14 NOTE — Telephone Encounter (Signed)
-----   Message from Melvenia Beam, MD sent at 01/11/2016  4:35 PM EDT ----- Labs all look normal thanks

## 2016-01-15 ENCOUNTER — Ambulatory Visit: Payer: Medicare Other | Admitting: Physical Therapy

## 2016-01-15 ENCOUNTER — Encounter: Payer: Self-pay | Admitting: *Deleted

## 2016-01-15 DIAGNOSIS — M6281 Muscle weakness (generalized): Secondary | ICD-10-CM | POA: Diagnosis not present

## 2016-01-15 DIAGNOSIS — M6249 Contracture of muscle, multiple sites: Secondary | ICD-10-CM | POA: Diagnosis not present

## 2016-01-15 DIAGNOSIS — H8111 Benign paroxysmal vertigo, right ear: Secondary | ICD-10-CM | POA: Diagnosis not present

## 2016-01-15 DIAGNOSIS — G8250 Quadriplegia, unspecified: Secondary | ICD-10-CM

## 2016-01-15 DIAGNOSIS — R2689 Other abnormalities of gait and mobility: Secondary | ICD-10-CM

## 2016-01-15 DIAGNOSIS — R2681 Unsteadiness on feet: Secondary | ICD-10-CM | POA: Diagnosis not present

## 2016-01-15 NOTE — Therapy (Signed)
[facility] [address] [place], [place], [address] Phone: [phone]   Fax:  [phone]  Physical Therapy Treatment  Patient Details  Name: Julie Elliott MRN: [id number] Date of Birth: 25-May-1956 Referring Provider: Sarina Ill, MD  Encounter Date: 01/15/2016      PT End of Session - 01/15/16 1805    Visit Number 9   Number of Visits 17   Date for PT Re-Evaluation 02/09/16   Authorization Type Medicare Traditional primary; Medicaid secondary   Authorization Time Period G Codes and PN required every 10 visits   PT Start Time 2119   PT Stop Time 1621   PT Time Calculation (min) 46 min   Activity Tolerance Patient tolerated treatment well   Behavior During Therapy [facility] for tasks assessed/performed      Past Medical History  Diagnosis Date  . Hypertension     well controlled  . DJD (degenerative joint disease)   . HLD (hyperlipidemia) 2008    stable, well controlled  . DM (diabetes mellitus) ([place]) 2008    stable HgBA1C at 6.5  . GERD (gastroesophageal reflux disease)     well controlled on Omeprazole  . Chronic headaches   . CHF (congestive heart failure) [facility])     Past Surgical History  Procedure Laterality Date  . Tubal ligation    . Carpel tunnel Right     20 yrs ago  . Cervical spine surgery  2016    There were no vitals filed for this visit.      Subjective Assessment - 01/15/16 1752    Subjective Pt reports no significant changes and no falls. Pt recently attended a family member's wedding and "walked the whole entire time," per patient. Pt continues home stretching program daily.    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 No/denies                         [facility] Adult PT Treatment/Exercise - 01/15/16 0001    Transfers   Transfers Sit to Stand;Stand to Sit   Sit to Stand 6: Modified independent (Device/Increase time)   Sit to Stand Details (indicate cue type and reason) Wide BOS during sit > stand from mat table   Stand to Sit 6: Modified independent (Device/Increase time)   Ambulation/Gait   Ambulation/Gait Yes   Ambulation/Gait Assistance 5: Supervision   Ambulation/Gait Assistance Details Gait 2 x230' without AD with tactile cueing for upright posture (emphasis on B hip extension) throughout.   Ambulation Distance (Feet) 460 Feet   Assistive device None   Gait Pattern Step-through pattern;Decreased arm swing - left;Decreased stride length;Decreased hip/knee flexion - right;Decreased hip/knee flexion - left;Decreased dorsiflexion - left;Left foot flat;Right flexed knee in stance;Left flexed knee in stance;Decreased trunk rotation;Wide base of support;Decreased dorsiflexion - right  limited B hip ext; ankle PF (L > R) during mid-term stance   Ambulation Surface Level;Indoor   Exercises   Exercises Other Exercises   Other Exercises  B hip flexor stretch via standing facing wall with B forearms against wall, bringing B hips toward wall 4 x15-sec holds (to pt tolerance). Also performed contract-relax (4 x3-sec holds) alternating with prolonged passive stretch 4 x30-sec  holds) of L iliopsoas with pt supine, hook lying. Performed static standing in standing frame x7 minutes for prolonged passive stretch of the following muscle groups in WB: B hip flexors, hamstrings, and plantarflexors. During final 2 minutes, lowered standing frame sling to 10-15 degrees of hip/knee flexion in attempt to promote pt performance of active hip extension; however, pt unable due to decreased motor control/weakness of B hip/knee extensors   Manual Therapy   Manual Therapy Joint mobilization   Joint Mobilization  Supine: performed anatomical distraction (using belt) of L hip 5 x10-sec holds in each of the following ROM: 45 degrees hip flexion, neutral hip extension, and 5 degrees extension. Performed manual inferior distraction of L hip in neutral hip/knee extension 4 x15-sec holds. L sidelying: performed grades I-III P/A mobilization of L hip 5 x10-sec holds for increased L hip extension ROM.                  PT Short Term Goals - 01/07/16 1459    PT SHORT TERM GOAL #1   Title Pt will perform initial home stretching program with mod I using paper handout to indicate safe compliance with HEP.  (Target date: 01/08/16)   Status Achieved   PT SHORT TERM GOAL #2   Title Pt will improve active L ankle dorsiflexion from -12 degrees to -8 degrees to facilitate consistent L foot clearance during gait.  Modified due to therapist error on evaluation   Baseline 6/5: L ankle dorsiflexion AROM = -10 degrees   Status Revised   PT SHORT TERM GOAL #3   Title Pt will improve R knee extension AROM from -26 degrees to -20 degrees to progress toward more normalized gait pattern.    Baseline 6/1: R knee extension AROM = -19 degrees   Status Achieved   PT SHORT TERM GOAL #4   Title Pt will improve L knee extension AROM from -12 degrees to -7 degrees to progress toward more normalized gait pattern.    Baseline Met 6/5.   Status Achieved   PT SHORT TERM GOAL #5   Title Pt will consistently perform sit <> stand from standard chair with mod I, normal BOS, and symmetrical LE placement.   Baseline Met 6/5.   Status Achieved   PT SHORT TERM GOAL #6   Title Pt will negotiate 4 stairs with R rail with mod I using LRAD to indicate pt safety using primary home entrance.    Baseline Met 6/5.   Status Achieved   PT SHORT TERM GOAL #7   Title Pt will negotiate standard ramp and curb step with supervision using LRAD to progress toward independence with community obstacle negotiation.    Baseline Met 6/5.   Status Achieved            PT Long Term Goals - 01/07/16 1806    PT LONG TERM GOAL #1   Title Pt will independently demo HEP to maximize functional gains made in PT.  (Target date: 02/05/16)   PT LONG TERM GOAL #2   Title Pt will improve R knee extension AROM from -26 degrees to -15 degrees to promote more normalized gait pattern, increase energy/time efficiency of mobility.    PT LONG TERM GOAL #3   Title Pt will improve L knee extension AROM from -12 degrees to -2 degrees to promote more normalized gait pattern, increase energy/time efficiency of mobility.    PT LONG TERM GOAL #4   Title Pt will improve gait velocity from 0.86  ft/sec to >/= 1.29 ft/sec to indicate significantly increased efficiency of ambulation.   PT LONG TERM GOAL #5   Title Pt will improve TUG time from 38.57 seconds to </= 28.17 seconds to indicate significant improvement in efficiency of mobility.    PT LONG TERM GOAL #6   Title Pt will negotiate standard ramp and curb step with mod I using LRAD to indicate increased stability/independence with community obstacle negotiation.    PT LONG TERM GOAL #7   Title Pt will ambulate >300' over level and unlevel indoor and (paved) outdoor surfaces with mod I using LRAD to indicate increased safety with community mobility.                Plan - 01/15/16 1807    Clinical Impression Statement Session focused on use of WB and manual therapy to increase ROM in B hips, knees, and ankles (emphasis on L). In standing frame, pt achieved full B knee extension. Pt making progress toward goals and will continue to benefit from PT  to maximize stability/independence with functional mobility.    Rehab Potential Good   Clinical Impairments Affecting Rehab Potential Positive: intact cognition, pt motivation, h/o compliance, age, and family support. Negative: multiple contractures   PT Frequency 2x / week   PT Duration 8 weeks   PT Treatment/Interventions Orthotic Fit/Training;ADLs/Self Care Home  Management;Gait training;Stair training;Functional mobility training;Therapeutic activities;Moist Heat;Electrical Stimulation;Therapeutic exercise;Balance training;Patient/family education;Neuromuscular re-education;Passive range of motion;Manual techniques   PT Next Visit Plan GCODES and PN.   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 Tobacco abuse 08/16/2015  . Peripheral neuropathy ([place]) 07/17/2015  . Cervical cord compression with myelopathy ([place]) 02/08/2015  . Frozen shoulder--left 09/05/2014  . Unintentional weight loss 06/20/2014  . Health care maintenance 03/23/2012  . Hypertension associated with diabetes ([place]) 08/17/2006  . GERD 08/17/2006  . History of diabetes mellitus, type II 08/11/2006  . Hyperlipidemia associated with type 2 diabetes mellitus ([place]) 08/11/2006  . Osteoarthritis 08/11/2006   Billie Ruddy, PT, DPT [facility] [address] [place], [place], [address] Phone: [phone]   Fax:  [phone] 01/15/2016, 6:10 PM  Name: Julie Elliott MRN: [id number] Date of Birth: Dec 02, 1955

## 2016-01-15 NOTE — Progress Notes (Signed)
Faxed signed order request for Static AFO, prefab custom fit orders (bilateral) back to [facility]. Fax: [phone]. Received fax confirmation.

## 2016-01-17 ENCOUNTER — Ambulatory Visit: Payer: Medicare Other | Admitting: Physical Therapy

## 2016-01-17 DIAGNOSIS — M6281 Muscle weakness (generalized): Secondary | ICD-10-CM | POA: Diagnosis not present

## 2016-01-17 DIAGNOSIS — H8111 Benign paroxysmal vertigo, right ear: Secondary | ICD-10-CM | POA: Diagnosis not present

## 2016-01-17 DIAGNOSIS — M6249 Contracture of muscle, multiple sites: Secondary | ICD-10-CM | POA: Diagnosis not present

## 2016-01-17 DIAGNOSIS — G8250 Quadriplegia, unspecified: Secondary | ICD-10-CM | POA: Diagnosis not present

## 2016-01-17 DIAGNOSIS — R2689 Other abnormalities of gait and mobility: Secondary | ICD-10-CM

## 2016-01-17 DIAGNOSIS — R2681 Unsteadiness on feet: Secondary | ICD-10-CM

## 2016-01-17 NOTE — Therapy (Signed)
[facility] [address] [place], [place], [address] Phone: [phone]   Fax:  [phone]  Physical Therapy Treatment  Patient Details  Name: Julie Elliott MRN: [id number] Date of Birth: Sep 27, 1955 Referring Provider: Sarina Ill, MD  Encounter Date: 01/17/2016      PT End of Session - 01/17/16 1648    Visit Number 10   Number of Visits 17   Date for PT Re-Evaluation 02/09/16   Authorization Type Medicare Traditional primary; Medicaid secondary   Authorization Time Period G Codes and PN required every 10 visits   PT Start Time 1537   PT Stop Time 1621   PT Time Calculation (min) 44 min   Activity Tolerance Patient tolerated treatment well   Behavior During Therapy [facility] for tasks assessed/performed      Past Medical History  Diagnosis Date  . Hypertension     well controlled  . DJD (degenerative joint disease)   . HLD (hyperlipidemia) 2008    stable, well controlled  . DM (diabetes mellitus) ([place]) 2008    stable HgBA1C at 6.5  . GERD (gastroesophageal reflux disease)     well controlled on Omeprazole  . Chronic headaches   . CHF (congestive heart failure) [facility])     Past Surgical History  Procedure Laterality Date  . Tubal ligation    . Carpel tunnel Right     20 yrs ago  . Cervical spine surgery  2016    There were no vitals filed for this visit.      Subjective Assessment - 01/17/16 1658    Subjective Son states, "Mom is walking so fast now. She almost leaves me." Pt perceives improvement her walking and feels more flexible.   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 No/denies                         [facility] Adult PT Treatment/Exercise - 01/17/16 0001    Ambulation/Gait   Ambulation/Gait Yes   Ambulation/Gait Assistance 5: Supervision   Ambulation/Gait Assistance Details Gait x175' without AD with cueing for reciprocal arm swing, trunk rotation, anterior weight shift.   Ambulation Distance (Feet) 175 Feet   Assistive device None   Gait Pattern Step-through pattern;Decreased arm swing - left;Decreased stride length;Decreased hip/knee flexion - right;Decreased hip/knee flexion - left;Decreased dorsiflexion - left;Left foot flat;Right flexed knee in stance;Left flexed knee in stance;Decreased trunk rotation;Wide base of support;Decreased dorsiflexion - right  limited B hip ext; ankle PF (L > R) during mid-term stance   Ambulation Surface Level;Indoor   Gait velocity 1.25 ft/sec with SBQC; 1.26 ft/sec without AD   Gait Comments Gait 6 x10' with RUE support at parallel bars with cueing (resisted cueing at posterior knees) for knee extension during stance phase of respective LE, cueing for increased hip/knee flexion during advancement of respective LE; and cueing for consistent heel strike bilaterally.   Timed Up and Go Test   TUG Normal TUG   Normal TUG (seconds) 32.81  without AD; 37.18 with SBQC   Manual Therapy   Manual Therapy Joint mobilization;Other (comment)   Manual therapy comments Moist heat applied  to R hamstrings while LLE mobilizations being performed and at L hamstrings, L ankle while RLE mobilizations being performed.   Joint Mobilization Joint mobilizations of the following joints (6 reps x10-second holds each): A/P of L femur (grades I-IV), A/P of R femur (grades I-IV) for increased knee extension ROM. A/P of L talus (grades I-IV) to increase L ankle dorsiflexion ROM.   Other Manual Therapy Contract-relax PNF (4 x3-sec holds) alternating with prolonged passive stretch (4 x30-sec holds) of L ankle plantarflexors for more  normalized gait pattern.                PT Education - 01/17/16 1649    Education provided Yes   Education Details TUG and gait velocity findings, progress, and functional implications.    Person(s) Educated Patient   Methods Explanation   Comprehension Verbalized understanding          PT Short Term Goals - 01/07/16 1459    PT SHORT TERM GOAL #1   Title Pt will perform initial home stretching program with mod I using paper handout to indicate safe compliance with HEP.  (Target date: 01/08/16)   Status Achieved   PT SHORT TERM GOAL #2   Title Pt will improve active L ankle dorsiflexion from -12 degrees to -8 degrees to facilitate consistent L foot clearance during gait.  Modified due to therapist error on evaluation   Baseline 6/5: L ankle dorsiflexion AROM = -10 degrees   Status Revised   PT SHORT TERM GOAL #3   Title Pt will improve R knee extension AROM from -26 degrees to -20 degrees to progress toward more normalized gait pattern.    Baseline 6/1: R knee extension AROM = -19 degrees   Status Achieved   PT SHORT TERM GOAL #4   Title Pt will improve L knee extension AROM from -12 degrees to -7 degrees to progress toward more normalized gait pattern.    Baseline Met 6/5.   Status Achieved   PT SHORT TERM GOAL #5   Title Pt will consistently perform sit <> stand from standard chair with mod I, normal BOS, and symmetrical LE placement.   Baseline Met 6/5.   Status Achieved   PT SHORT TERM GOAL #6   Title Pt will negotiate 4 stairs with R rail with mod I using LRAD to indicate pt safety using primary home entrance.    Baseline Met 6/5.   Status Achieved   PT SHORT TERM GOAL #7   Title Pt will negotiate standard ramp and curb step with supervision using LRAD to progress toward independence with community obstacle negotiation.    Baseline Met 6/5.   Status Achieved           PT Long Term Goals - 01/07/16 1806    PT LONG TERM GOAL #1   Title Pt will independently  demo HEP to maximize functional gains made in PT.  (Target date: 02/05/16)   PT LONG TERM GOAL #2   Title Pt will improve R knee extension AROM from -26 degrees to -15 degrees to promote more normalized gait pattern, increase energy/time efficiency of mobility.    PT LONG TERM GOAL #3   Title Pt will improve L knee extension AROM from -12 degrees to -2 degrees to promote more normalized gait pattern, increase energy/time efficiency of mobility.    PT LONG TERM GOAL #4   Title Pt will improve gait velocity from 0.86 ft/sec to >/= 1.29 ft/sec to indicate significantly increased efficiency  of ambulation.   PT LONG TERM GOAL #5   Title Pt will improve TUG time from 38.57 seconds to </= 28.17 seconds to indicate significant improvement in efficiency of mobility.    PT LONG TERM GOAL #6   Title Pt will negotiate standard ramp and curb step with mod I using LRAD to indicate increased stability/independence with community obstacle negotiation.    PT LONG TERM GOAL #7   Title Pt will ambulate >300' over level and unlevel indoor and (paved) outdoor surfaces with mod I using LRAD to indicate increased safety with community mobility.                Plan - 2016-01-23 1651    Clinical Impression Statement Session focused on continued manual therapy to address contractures, promote more normalized postural alignment and gait pattern. Since PT evaluation, TUG time has improved from 38.57 sec to 32.81 seconds; and gait velocity has increased from 0.86 ft/sec to 1.25 ft/sec.    Rehab Potential Good   Clinical Impairments Affecting Rehab Potential Positive: intact cognition, pt motivation, h/o compliance, age, and family support. Negative: multiple contractures   PT Frequency 2x / week   PT Duration 8 weeks   PT Treatment/Interventions Orthotic Fit/Training;ADLs/Self Care Home Management;Gait training;Stair training;Functional mobility training;Therapeutic activities;Moist Heat;Electrical  Stimulation;Therapeutic exercise;Balance training;Patient/family education;Neuromuscular re-education;Passive range of motion;Manual techniques   PT Next Visit Plan Continue manual therapy, joint mobilizations, stretching, WB (standing frame), and gait training.   Consulted and Agree with Plan of Care Patient      Patient will benefit from skilled therapeutic intervention in order to improve the following deficits and impairments:  Abnormal gait, Decreased knowledge of use of DME, Decreased balance, Decreased range of motion, Impaired flexibility, Postural dysfunction, Impaired tone, Decreased strength, Impaired UE functional use, Decreased coordination  Visit Diagnosis: Other abnormalities of gait and mobility  Contracture of muscle, multiple sites  Unsteadiness on feet  Muscle weakness (generalized)       G-Codes - 01-23-16 1643    Functional Assessment Tool Used TUG: 32.81 without AD, 37.41 seconds with SBQC; gait velocity = 1.26 ft/sec without AD, 1.25 ft/sec with SBQC   Functional Limitation Mobility: Walking and moving around   Mobility: Walking and Moving Around Current Status [phone]) At least 60 percent but less than 80 percent impaired, limited or restricted   Mobility: Walking and Moving Around Goal Status [phone]) At least 40 percent but less than 60 percent impaired, limited or restricted      Problem List Patient Active Problem List   Diagnosis Date Noted  . 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 Tobacco abuse 08/16/2015  . Peripheral neuropathy ([place]) 07/17/2015  . Cervical cord compression with myelopathy ([place]) 02/08/2015  . Frozen shoulder--left 09/05/2014  . Unintentional weight loss 06/20/2014  . Health care maintenance 03/23/2012  . Hypertension associated with diabetes ([place]) 08/17/2006  . GERD 08/17/2006  . History of diabetes mellitus, type II 08/11/2006  . Hyperlipidemia associated with type 2 diabetes mellitus ([place]) 08/11/2006  . Osteoarthritis  08/11/2006    Physical Therapy Progress Note  Dates of Reporting Period: 12/11/15 to 01-23-2016  Objective Reports of Subjective Statement: See Subjective section above.   Objective Measurements: See above TUG and gait velocity.  Goal Update: revised goals for L ankle dorsiflexion due to this PT's error in documentation. See above.  Plan: Continue per original POC.  Reason Skilled Services are Required: To maximize stability/independence with functional mobility, decrease fall risk, and progress toward PLOF.   Benjie Karvonen  Hobble, PT, DPT [facility] [address] [place], [place], [address] Phone: [phone]   Fax:  [phone] 01/17/2016, 5:01 PM   Name: Saren Corkern MRN: [id number] Date of Birth: [birth year]

## 2016-01-21 ENCOUNTER — Encounter: Payer: Medicare Other | Admitting: Occupational Therapy

## 2016-01-21 ENCOUNTER — Ambulatory Visit: Payer: Medicare Other | Admitting: Physical Therapy

## 2016-01-21 DIAGNOSIS — R2689 Other abnormalities of gait and mobility: Secondary | ICD-10-CM

## 2016-01-21 DIAGNOSIS — R2681 Unsteadiness on feet: Secondary | ICD-10-CM

## 2016-01-21 DIAGNOSIS — M6281 Muscle weakness (generalized): Secondary | ICD-10-CM | POA: Diagnosis not present

## 2016-01-21 DIAGNOSIS — G8250 Quadriplegia, unspecified: Secondary | ICD-10-CM | POA: Diagnosis not present

## 2016-01-21 DIAGNOSIS — H8111 Benign paroxysmal vertigo, right ear: Secondary | ICD-10-CM | POA: Diagnosis not present

## 2016-01-21 DIAGNOSIS — M6249 Contracture of muscle, multiple sites: Secondary | ICD-10-CM

## 2016-01-21 NOTE — Therapy (Signed)
[facility] [address] [place], [place], [address] Phone: [phone]   Fax:  [phone]  Physical Therapy Treatment  Patient Details  Name: Julie Elliott MRN: [id number] Date of Birth: 07-07-1956 Referring Provider: Sarina Ill, MD  Encounter Date: 01/21/2016      PT End of Session - 01/21/16 1505    Visit Number 11   Number of Visits 17   Date for PT Re-Evaluation 02/09/16   Authorization Type Medicare Traditional primary; Medicaid secondary   Authorization Time Period G Codes and PN required every 10 visits   PT Start Time 1404   PT Stop Time 1445   PT Time Calculation (min) 41 min   Activity Tolerance Patient tolerated treatment well   Behavior During Therapy [facility] for tasks assessed/performed      Past Medical History  Diagnosis Date  . Hypertension     well controlled  . DJD (degenerative joint disease)   . HLD (hyperlipidemia) 2008    stable, well controlled  . DM (diabetes mellitus) ([place]) 2008    stable HgBA1C at 6.5  . GERD (gastroesophageal reflux disease)     well controlled on Omeprazole  . Chronic headaches   . CHF (congestive heart failure) [facility])     Past Surgical History  Procedure Laterality Date  . Tubal ligation    . Carpel tunnel Right     20 yrs ago  . Cervical spine surgery  2016    There were no vitals filed for this visit.      Subjective Assessment - 01/21/16 1450    Subjective Pt reports she has been trying to walk short distances at home without QC.    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 No/denies                         [facility] Adult PT Treatment/Exercise - 01/21/16 0001     Ambulation/Gait   Ambulation/Gait Yes   Ambulation/Gait Assistance 5: Supervision   Ambulation/Gait Assistance Details Gait x175' over level, indoor and unlevel, paved surfaces without AD with supervision, no overt LOB. After seated rest break, pt participated in obstacles course (without AD) x30' total over compliant (red mat) surface stepping forward and laterally (bilat directions) over ski poles, negotiating 4" curb step without AD/rails  (forward-facing x3 trials). When negotiating obstacle course, pt did require min guard due to LOB x3 total, but pt did demo effective self-recovery (via stepping strategy).   Ambulation Distance (Feet) 325 Feet  175' + 150'   Assistive device None   Gait Pattern Step-through pattern;Decreased arm swing - left;Decreased stride length;Decreased hip/knee flexion - right;Decreased hip/knee flexion - left;Decreased dorsiflexion - left;Left foot flat;Right flexed knee in stance;Left flexed knee in stance;Decreased trunk rotation;Wide base of support;Decreased dorsiflexion - right  limited B hip ext; ankle PF (L > R) during mid-term stance   Ambulation Surface Level;Unlevel;Indoor;Outdoor;Paved   Ramp 5: Supervision   Ramp Details (indicate cue type and reason) no AD   Curb 5: Supervision   Curb Details (indicate cue type and reason) Pt descended standard curb step (outdoors) without AD requiring close (S); however, pt did not ascend due to fatigue and fear of falling.  PT Education - 01/21/16 1450    Education provided Yes   Education Details B tension-adjusting PRAFO: purpose, donning/doffing, importance of increased time with PRAFO's on, use of kickstands for neutral hip alignment.   Person(s) Educated Patient   Methods Explanation;Handout;Demonstration   Comprehension Verbalized understanding          PT Short Term Goals - 01/07/16 1459    PT SHORT TERM GOAL #1   Title Pt will perform initial home stretching program with mod  I using paper handout to indicate safe compliance with HEP.  (Target date: 01/08/16)   Status Achieved   PT SHORT TERM GOAL #2   Title Pt will improve active L ankle dorsiflexion from -12 degrees to -8 degrees to facilitate consistent L foot clearance during gait.  Modified due to therapist error on evaluation   Baseline 6/5: L ankle dorsiflexion AROM = -10 degrees   Status Revised   PT SHORT TERM GOAL #3   Title Pt will improve R knee extension AROM from -26 degrees to -20 degrees to progress toward more normalized gait pattern.    Baseline 6/1: R knee extension AROM = -19 degrees   Status Achieved   PT SHORT TERM GOAL #4   Title Pt will improve L knee extension AROM from -12 degrees to -7 degrees to progress toward more normalized gait pattern.    Baseline Met 6/5.   Status Achieved   PT SHORT TERM GOAL #5   Title Pt will consistently perform sit <> stand from standard chair with mod I, normal BOS, and symmetrical LE placement.   Baseline Met 6/5.   Status Achieved   PT SHORT TERM GOAL #6   Title Pt will negotiate 4 stairs with R rail with mod I using LRAD to indicate pt safety using primary home entrance.    Baseline Met 6/5.   Status Achieved   PT SHORT TERM GOAL #7   Title Pt will negotiate standard ramp and curb step with supervision using LRAD to progress toward independence with community obstacle negotiation.    Baseline Met 6/5.   Status Achieved           PT Long Term Goals - 01/07/16 1806    PT LONG TERM GOAL #1   Title Pt will independently demo HEP to maximize functional gains made in PT.  (Target date: 02/05/16)   PT LONG TERM GOAL #2   Title Pt will improve R knee extension AROM from -26 degrees to -15 degrees to promote more normalized gait pattern, increase energy/time efficiency of mobility.    PT LONG TERM GOAL #3   Title Pt will improve L knee extension AROM from -12 degrees to -2 degrees to promote more normalized gait pattern, increase energy/time efficiency  of mobility.    PT LONG TERM GOAL #4   Title Pt will improve gait velocity from 0.86 ft/sec to >/= 1.29 ft/sec to indicate significantly increased efficiency of ambulation.   PT LONG TERM GOAL #5   Title Pt will improve TUG time from 38.57 seconds to </= 28.17 seconds to indicate significant improvement in efficiency of mobility.    PT LONG TERM GOAL #6   Title Pt will negotiate standard ramp and curb step with mod I using LRAD to indicate increased stability/independence with community obstacle negotiation.    PT LONG TERM GOAL #7   Title Pt will ambulate >300' over level and unlevel indoor and (paved) outdoor surfaces with mod I using LRAD to indicate increased  safety with community mobility.                Plan - 01/21/16 1505    Clinical Impression Statement Orthotist present to deliver B tension-adjusting PRAFO's and to fit PRAFO's. While orthotist adjusting straps and ensuring proper fit, this therapist and orthotist educated pt on the following in reference to B tension-adjusting PRAFO: purpose, donning/doffing, importance of increased time with PRAFO's on, use of kickstands for neutral hip alignment. Patient verbalized understanding but is unable to return demo of donning/doffing due to impaired fine motor control in BLE's. Patient reports son, [name], will be donning/doffing at home. Written instructons sent home with patient.    Rehab Potential Good   Clinical Impairments Affecting Rehab Potential Positive: intact cognition, pt motivation, h/o compliance, age, and family support. Negative: multiple contractures   PT Frequency 2x / week   PT Duration 8 weeks   PT Treatment/Interventions Orthotic Fit/Training;ADLs/Self Care Home Management;Gait training;Stair training;Functional mobility training;Therapeutic activities;Moist Heat;Electrical Stimulation;Therapeutic exercise;Balance training;Patient/family education;Neuromuscular re-education;Passive range of motion;Manual techniques    PT Next Visit Plan Gait training without AD with emphasis on obstacle negotiation, compliance surfaces, etc. TE: initiate closed chain strengthening (wall squats).   Consulted and Agree with Plan of Care Patient      Patient will benefit from skilled therapeutic intervention in order to improve the following deficits and impairments:  Abnormal gait, Decreased knowledge of use of DME, Decreased balance, Decreased range of motion, Impaired flexibility, Postural dysfunction, Impaired tone, Decreased strength, Impaired UE functional use, Decreased coordination  Visit Diagnosis: Contracture of muscle, multiple sites  Other abnormalities of gait and mobility  Unsteadiness on feet     Problem List Patient Active Problem List   Diagnosis Date Noted  . Spastic quadriparesis ([place]) 11/02/2015  . Tobacco abuse 08/16/2015  . Peripheral neuropathy ([place]) 07/17/2015  . Cervical cord compression with myelopathy ([place]) 02/08/2015  . Frozen shoulder--left 09/05/2014  . Unintentional weight loss 06/20/2014  . Health care maintenance 03/23/2012  . Hypertension associated with diabetes ([place]) 08/17/2006  . GERD 08/17/2006  . History of diabetes mellitus, type II 08/11/2006  . Hyperlipidemia associated with type 2 diabetes mellitus ([place]) 08/11/2006  . Osteoarthritis 08/11/2006   Billie Ruddy, PT, DPT [facility] [address] [place], [place], [address] Phone: [phone]   Fax:  [phone] 01/21/2016, 3:09 PM  Name: Julie Elliott MRN: [id number] Date of Birth: [birth year]

## 2016-01-22 ENCOUNTER — Ambulatory Visit: Payer: Medicare Other | Admitting: Physical Therapy

## 2016-01-22 ENCOUNTER — Encounter: Payer: Medicare Other | Admitting: Occupational Therapy

## 2016-01-22 DIAGNOSIS — R2681 Unsteadiness on feet: Secondary | ICD-10-CM

## 2016-01-22 DIAGNOSIS — M6249 Contracture of muscle, multiple sites: Secondary | ICD-10-CM

## 2016-01-22 DIAGNOSIS — M6281 Muscle weakness (generalized): Secondary | ICD-10-CM | POA: Diagnosis not present

## 2016-01-22 DIAGNOSIS — G8250 Quadriplegia, unspecified: Secondary | ICD-10-CM | POA: Diagnosis not present

## 2016-01-22 DIAGNOSIS — H8111 Benign paroxysmal vertigo, right ear: Secondary | ICD-10-CM | POA: Diagnosis not present

## 2016-01-22 DIAGNOSIS — R2689 Other abnormalities of gait and mobility: Secondary | ICD-10-CM | POA: Diagnosis not present

## 2016-01-22 NOTE — Therapy (Signed)
[facility] [address] [place], [place], [address] Phone: [phone]   Fax:  [phone]  Physical Therapy Treatment  Patient Details  Name: Julie Elliott MRN: [id number] Date of Birth: [birth year] Referring Provider: Sarina Ill, MD  Encounter Date: 01/22/2016      PT End of Session - 01/22/16 1738    Visit Number 12   Number of Visits 17   Date for PT Re-Evaluation 02/09/16   Authorization Type Medicare Traditional primary; Medicaid secondary   Authorization Time Period G Codes and PN required every 10 visits   PT Start Time 1449   PT Stop Time 1530   PT Time Calculation (min) 41 min   Activity Tolerance Patient tolerated treatment well   Behavior During Therapy [facility] for tasks assessed/performed      Past Medical History  Diagnosis Date  . Hypertension     well controlled  . DJD (degenerative joint disease)   . HLD (hyperlipidemia) 2008    stable, well controlled  . DM (diabetes mellitus) ([place]) 2008    stable HgBA1C at 6.5  . GERD (gastroesophageal reflux disease)     well controlled on Omeprazole  . Chronic headaches   . CHF (congestive heart failure) [facility])     Past Surgical History  Procedure Laterality Date  . Tubal ligation    . Carpel tunnel Right     20 yrs ago  . Cervical spine surgery  2016    There were no vitals filed for this visit.      Subjective Assessment - 01/22/16 1454    Subjective Pt reports no significant changes, no falls. States, "Those (PRAFO) boots are nice. They sleep like I do. 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 No/denies            [facility] PT Assessment - 01/22/16 0001    AROM   Right Knee Extension -16   Left Knee Extension -12                     [facility] Adult PT Treatment/Exercise - 01/22/16 0001    Bed Mobility   Rolling Right 5: Supervision;6: Modified independent (Device/Increase time)   Rolling Right Details (indicate cue type and reason) Pt rolled from supine > prone to R side x3 trials total, initially requiring cueing to place RUE in full flexion to enable rolling to R side. Pt gave effective return demo during subsequent 2 trials. Noted L -beating nystagmus (? torsional component) x5-10 seconds duration when pt transitioning from supine > sit (pt on L side with head rotated slightly to R).  nystagmus accompanied by vertiginous symptoms   Exercises   Exercises Other Exercises   Other Exercises  Wall squats x10 reps, x5 reps (to pt fatigue) for increased quad control. Trials ended due to pt report of R knee "feeling like it's going to give out," per pt. Supine, B hamstring stretch 2 x45-sec holds per side for improvoed postural alignment. Prone on elbows x3 minutes, x2 minutes (to pt tolerance) to increase extensibility of B hip flexors.  Balance Exercises - 01/22/16 1734    Balance Exercises: Standing   Standing Eyes Closed Narrow base of support (BOS);Head turns;5 reps  horizontl, vertical head turns 2 x5 each   Wall Bumps Hip   Wall Bumps-Hips Eyes opened;Anterior/posterior;15 reps  verbal/demo cueing for technique           PT Education - 01/22/16 1455    Education provided Yes   Education Details Strongly recommending that pt not walk in PRAFO's, as bottom of boots are not designed for ambulation.   Person(s) Educated Patient   Methods Explanation   Comprehension Verbalized understanding          PT Short Term Goals - 01/07/16 1459    PT SHORT TERM GOAL #1   Title Pt will perform initial home stretching program with  mod I using paper handout to indicate safe compliance with HEP.  (Target date: 01/08/16)   Status Achieved   PT SHORT TERM GOAL #2   Title Pt will improve active L ankle dorsiflexion from -12 degrees to -8 degrees to facilitate consistent L foot clearance during gait.  Modified due to therapist error on evaluation   Baseline 6/5: L ankle dorsiflexion AROM = -10 degrees   Status Revised   PT SHORT TERM GOAL #3   Title Pt will improve R knee extension AROM from -26 degrees to -20 degrees to progress toward more normalized gait pattern.    Baseline 6/1: R knee extension AROM = -19 degrees   Status Achieved   PT SHORT TERM GOAL #4   Title Pt will improve L knee extension AROM from -12 degrees to -7 degrees to progress toward more normalized gait pattern.    Baseline Met 6/5.   Status Achieved   PT SHORT TERM GOAL #5   Title Pt will consistently perform sit <> stand from standard chair with mod I, normal BOS, and symmetrical LE placement.   Baseline Met 6/5.   Status Achieved   PT SHORT TERM GOAL #6   Title Pt will negotiate 4 stairs with R rail with mod I using LRAD to indicate pt safety using primary home entrance.    Baseline Met 6/5.   Status Achieved   PT SHORT TERM GOAL #7   Title Pt will negotiate standard ramp and curb step with supervision using LRAD to progress toward independence with community obstacle negotiation.    Baseline Met 6/5.   Status Achieved           PT Long Term Goals - 01/22/16 1518    PT LONG TERM GOAL #1   Title Pt will independently demo HEP to maximize functional gains made in PT.  (Target date: 02/05/16)   PT LONG TERM GOAL #2   Title Pt will improve R knee extension AROM from -26 degrees to -15 degrees to promote more normalized gait pattern, increase energy/time efficiency of mobility.    PT LONG TERM GOAL #3   Title Pt will improve L knee extension AROM from -12 degrees to -2 degrees to promote more normalized gait pattern, increase energy/time  efficiency of mobility.    PT LONG TERM GOAL #4   Title Pt will improve gait velocity from 0.86 ft/sec to >/= 1.29 ft/sec to indicate significantly increased efficiency of ambulation.   PT LONG TERM GOAL #5   Title Pt will improve TUG time from 38.57 seconds to </= 28.17 seconds to indicate significant improvement in efficiency of mobility.    PT LONG TERM GOAL #6  Title Pt will negotiate standard ramp and curb step with mod I using LRAD to indicate increased stability/independence with community obstacle negotiation.    PT LONG TERM GOAL #7   Title Pt will ambulate >300' over level and unlevel indoor and (paved) outdoor surfaces with mod I using LRAD to indicate increased safety with community mobility.                Plan - 01/22/16 1740    Clinical Impression Statement Session focused on functional LE strengthening, standing balance, and practicing rolling from supine > prone to increase pt indepenence with bed mobility and to enable pt to stretch hip flexors in prone. Pt able to roll from supine > prone with mod I for the first time during this session. Noted onset of nystagmus and vertiginous symptoms during rolling; unable to fully assess due to time constraint. Will address at next session.    Rehab Potential Good   Clinical Impairments Affecting Rehab Potential Positive: intact cognition, pt motivation, h/o compliance, age, and family support. Negative: multiple contractures   PT Frequency 2x / week   PT Duration 8 weeks   PT Treatment/Interventions Orthotic Fit/Training;ADLs/Self Care Home Management;Gait training;Stair training;Functional mobility training;Therapeutic activities;Moist Heat;Electrical Stimulation;Therapeutic exercise;Balance training;Patient/family education;Neuromuscular re-education;Passive range of motion;Manual techniques   PT Next Visit Plan Assess for BPPV and treat prn. *Send recert (add diagnosis and CRM, Vestibular) if treating BPPV. Consider adding goals  for balance.   Consulted and Agree with Plan of Care Patient      Patient will benefit from skilled therapeutic intervention in order to improve the following deficits and impairments:  Abnormal gait, Decreased knowledge of use of DME, Decreased balance, Decreased range of motion, Impaired flexibility, Postural dysfunction, Impaired tone, Decreased strength, Impaired UE functional use, Decreased coordination  Visit Diagnosis: Contracture of muscle, multiple sites  Other abnormalities of gait and mobility  Unsteadiness on feet  Muscle weakness (generalized)     Problem List Patient Active Problem List   Diagnosis Date Noted  . Spastic quadriparesis ([place]) 11/02/2015  . Tobacco abuse 08/16/2015  . Peripheral neuropathy ([place]) 07/17/2015  . Cervical cord compression with myelopathy ([place]) 02/08/2015  . Frozen shoulder--left 09/05/2014  . Unintentional weight loss 06/20/2014  . Health care maintenance 03/23/2012  . Hypertension associated with diabetes ([place]) 08/17/2006  . GERD 08/17/2006  . History of diabetes mellitus, type II 08/11/2006  . Hyperlipidemia associated with type 2 diabetes mellitus ([place]) 08/11/2006  . Osteoarthritis 08/11/2006   Billie Ruddy, PT, DPT [facility] [address] [place], [place], [address] Phone: [phone]   Fax:  [phone] 01/22/2016, 5:45 PM  Name: Julie Elliott MRN: [id number] Date of Birth: 12-08-1955

## 2016-01-29 ENCOUNTER — Ambulatory Visit: Payer: Medicare Other | Admitting: Physical Therapy

## 2016-01-29 DIAGNOSIS — G8250 Quadriplegia, unspecified: Secondary | ICD-10-CM | POA: Diagnosis not present

## 2016-01-29 DIAGNOSIS — M6281 Muscle weakness (generalized): Secondary | ICD-10-CM | POA: Diagnosis not present

## 2016-01-29 DIAGNOSIS — H8111 Benign paroxysmal vertigo, right ear: Secondary | ICD-10-CM | POA: Diagnosis not present

## 2016-01-29 DIAGNOSIS — R2689 Other abnormalities of gait and mobility: Secondary | ICD-10-CM

## 2016-01-29 DIAGNOSIS — M6249 Contracture of muscle, multiple sites: Secondary | ICD-10-CM | POA: Diagnosis not present

## 2016-01-29 DIAGNOSIS — R2681 Unsteadiness on feet: Secondary | ICD-10-CM

## 2016-01-29 NOTE — Therapy (Signed)
[facility] [address] [place], [place], [address] Phone: [phone]   Fax:  [phone]  Physical Therapy Treatment  Patient Details  Name: Julie Elliott MRN: [id number] Date of Birth: Jan 20, 1956 Referring Provider: Sarina Ill, MD  Encounter Date: 01/29/2016      PT End of Session - 01/29/16 1628    Visit Number 13   Number of Visits 17   Date for PT Re-Evaluation 02/28/16   Authorization Type Medicare Traditional primary; Medicaid secondary   Authorization Time Period G Codes and PN required every 10 visits   PT Start Time 7001   PT Stop Time 1615   PT Time Calculation (min) 44 min   Activity Tolerance Patient tolerated treatment well   Behavior During Therapy [facility] for tasks assessed/performed      Past Medical History  Diagnosis Date  . Hypertension     well controlled  . DJD (degenerative joint disease)   . HLD (hyperlipidemia) 2008    stable, well controlled  . DM (diabetes mellitus) ([place]) 2008    stable HgBA1C at 6.5  . GERD (gastroesophageal reflux disease)     well controlled on Omeprazole  . Chronic headaches   . CHF (congestive heart failure) [facility])     Past Surgical History  Procedure Laterality Date  . Tubal ligation    . Carpel tunnel Right     20 yrs ago  . Cervical spine surgery  2016    There were no vitals filed for this visit.          [facility] PT Assessment - 01/29/16 0001    Standardized Balance Assessment   Standardized Balance Assessment Berg Balance Test   Berg Balance Test   Sit to Stand Needs minimal aid to stand or to stabilize   Standing Unsupported Able to stand safely 2 minutes   Sitting with Back Unsupported but Feet Supported on Floor or Stool Able to sit safely and securely 2 minutes   Stand to Sit Sits safely with minimal use of hands   Transfers Able to transfer safely, definite need of hands   Standing Unsupported with Eyes Closed Able to stand 10 seconds  safely   Standing Ubsupported with Feet Together Able to place feet together independently and stand for 1 minute with supervision   From Standing, Reach Forward with Outstretched Arm Can reach forward >12 cm safely (5")   From Standing Position, Pick up Object from Floor Able to pick up shoe, needs supervision   From Standing Position, Turn to Look Behind Over each Shoulder Turn sideways only but maintains balance   Turn 360 Degrees Able to turn 360 degrees safely but slowly   Standing Unsupported, Alternately Place Feet on Step/Stool Able to stand independently and complete 8 steps >20 seconds  23 sec   Standing Unsupported, One Foot in Front Able to plae foot ahead of the other independently and hold 30 seconds   Standing on One Leg Tries to lift leg/unable to hold 3 seconds but remains standing independently   Total Score 40            Vestibular Assessment - 01/29/16 0001    Symptom Behavior   Type of Dizziness Spinning   Frequency of Dizziness "Comes in spurts" per pt   Duration of Dizziness < 30 sec   Aggravating Factors Supine to sit;Rolling to right   Relieving Factors Head stationary   Positional Testing   Dix-Hallpike Dix-Hallpike Right   Dix-Hallpike Right  Dix-Hallpike Right Duration 10-second latency; nystagmus x10-15 seconds   Dix-Hallpike Right Symptoms Upbeat, right rotatory nystagmus                 [facility] Adult PT Treatment/Exercise - 01/29/16 0001    Timed Up and Go Test   TUG Normal TUG   Normal TUG (seconds) 31.66         Vestibular Treatment/Exercise - 01/29/16 0001    Vestibular Treatment/Exercise   Vestibular Treatment Provided Canalith Repositioning   Canalith Repositioning Epley Manuever Right    EPLEY MANUEVER RIGHT   Number of Reps  2   Overall Response Symptoms Resolved   Response Details  Following second Epley Maneuver, reasessment of R Dix-Hallpike (-) and asymptomatic.                 PT Short Term Goals - 01/07/16  1459    PT SHORT TERM GOAL #1   Title Pt will perform initial home stretching program with mod I using paper handout to indicate safe compliance with HEP.  (Target date: 01/08/16)   Status Achieved   PT SHORT TERM GOAL #2   Title Pt will improve active L ankle dorsiflexion from -12 degrees to -8 degrees to facilitate consistent L foot clearance during gait.  Modified due to therapist error on evaluation   Baseline 6/5: L ankle dorsiflexion AROM = -10 degrees   Status Revised   PT SHORT TERM GOAL #3   Title Pt will improve R knee extension AROM from -26 degrees to -20 degrees to progress toward more normalized gait pattern.    Baseline 6/1: R knee extension AROM = -19 degrees   Status Achieved   PT SHORT TERM GOAL #4   Title Pt will improve L knee extension AROM from -12 degrees to -7 degrees to progress toward more normalized gait pattern.    Baseline Met 6/5.   Status Achieved   PT SHORT TERM GOAL #5   Title Pt will consistently perform sit <> stand from standard chair with mod I, normal BOS, and symmetrical LE placement.   Baseline Met 6/5.   Status Achieved   PT SHORT TERM GOAL #6   Title Pt will negotiate 4 stairs with R rail with mod I using LRAD to indicate pt safety using primary home entrance.    Baseline Met 6/5.   Status Achieved   PT SHORT TERM GOAL #7   Title Pt will negotiate standard ramp and curb step with supervision using LRAD to progress toward independence with community obstacle negotiation.    Baseline Met 6/5.   Status Achieved           PT Long Term Goals - 01/29/16 1632    PT LONG TERM GOAL #1   Title Pt will independently demo HEP to maximize functional gains made in PT.  (Target date: 02/05/16)   Baseline Met 6/27.   Status Achieved   PT LONG TERM GOAL #2   Title Pt will improve R knee extension AROM from -26 degrees to -15 degrees to promote more normalized gait pattern, increase energy/time efficiency of mobility.  (02/26/16)   Status On-going   PT  LONG TERM GOAL #3   Title Pt will improve L knee extension AROM from -12 degrees to -2 degrees to promote more normalized gait pattern, increase energy/time efficiency of mobility.  (02/26/16)   Status On-going   PT LONG TERM GOAL #4   Title Pt will improve gait velocity from 0.86 ft/sec to >/=  1.29 ft/sec to indicate significantly increased efficiency of ambulation. (02/26/16)   Status On-going   PT LONG TERM GOAL #5   Title Pt will improve TUG time from 38.57 seconds to </= 28.17 seconds to indicate significant improvement in efficiency of mobility. (02/26/16)   Status On-going   PT LONG TERM GOAL #6   Title Pt will negotiate standard ramp and curb step with mod I using LRAD to indicate increased stability/independence with community obstacle negotiation. (02/26/16)   Status On-going   PT LONG TERM GOAL #7   Title Pt will ambulate >300' over level and unlevel indoor and (paved) outdoor surfaces with mod I using LRAD to indicate increased safety with community mobility.    Baseline Met 6/27.   Status Achieved   PT LONG TERM GOAL #8   Title Positional testing will be negative to indicate resovled BPPV.  (02/26/16)   Status New   PT LONG TERM GOAL  #9   TITLE Berg score will improve from 40/56 to >/= 45/56 to indicate decreased fall risk.  (02/26/16)   Status New               Plan - 01/29/16 1558    Clinical Impression Statement R Dix-Hallpike reveals R upbeating torsional BPPV x10-15 seconds accompanied by vertiginous symptoms. Unable to rule out R posterior canalithasis. Therefore, treated with R Epley maneuver x2 trials. R Dix-Hallpike (-) and asymptomatic upon reassessment.  Completed Berg with score of 40/56, suggesting increased fall risk. Added LTG's for BPPV and Berg. Pt will benefit from 4 additional sessions over a 4-week period. POC extended, as patient missed sessions due to scheduling conflicts.   Rehab Potential Good   Clinical Impairments Affecting Rehab Potential  Excellent motivation, compliance, and progress with PT thusfar   PT Frequency Other (comment)  4 total sessions   PT Duration 4 weeks   PT Treatment/Interventions Orthotic Fit/Training;ADLs/Self Care Home Management;Gait training;Stair training;Functional mobility training;Therapeutic activities;Moist Heat;Therapeutic exercise;Balance training;Patient/family education;Neuromuscular re-education;Passive range of motion;Manual techniques;Canalith Repostioning;Vestibular   PT Next Visit Plan Reassess for BPPV (R PC). Assess LTG's for LE ROM. Provide HEP for standing balance. Schedule 1 additional PT session (preferably week of 7/27)   Consulted and Agree with Plan of Care Patient      Patient will benefit from skilled therapeutic intervention in order to improve the following deficits and impairments:  Abnormal gait, Decreased knowledge of use of DME, Decreased balance, Decreased range of motion, Impaired flexibility, Postural dysfunction, Impaired tone, Decreased strength, Impaired UE functional use, Decreased coordination, Dizziness  Visit Diagnosis: BPPV (benign paroxysmal positional vertigo), right - Plan: PT plan of care cert/re-cert  Unsteadiness on feet - Plan: PT plan of care cert/re-cert  Other abnormalities of gait and mobility - Plan: PT plan of care cert/re-cert  Contracture of muscle, multiple sites - Plan: PT plan of care cert/re-cert  Muscle weakness (generalized) - Plan: PT plan of care cert/re-cert     Problem List Patient Active Problem List   Diagnosis Date Noted  . 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 Tobacco abuse 08/16/2015  . Peripheral neuropathy ([place]) 07/17/2015  . Cervical cord compression with myelopathy ([place]) 02/08/2015  . Frozen shoulder--left 09/05/2014  . Unintentional weight loss 06/20/2014  . Health care maintenance 03/23/2012  . Hypertension associated with diabetes ([employer]) 08/17/2006  . GERD 08/17/2006  . History of diabetes mellitus, type II  08/11/2006  . Hyperlipidemia associated with type 2 diabetes mellitus ([place]) 08/11/2006  . Osteoarthritis 08/11/2006    Billie Ruddy,  PT, DPT [facility] [address] [place], [place], [address] Phone: [phone]   Fax:  [phone] 01/29/2016, 4:45 PM  Name: Julie Elliott MRN: [id number] Date of Birth: 1956/03/01

## 2016-01-30 ENCOUNTER — Telehealth: Payer: Self-pay | Admitting: *Deleted

## 2016-01-30 NOTE — Telephone Encounter (Addendum)
Called Julie Elliott. Advised per Kendal Hymen. At [facility], she received a gel overlay on 04/12/15, and would not qualify for a replacement for 3 yr per insurance. She can try to get a low air loss mattress.  Julie Elliott states she already has a hospital bed. She was not certain what a gel overlay was. She States "It is a plastic thing". She said her hospital bed got delivered on 04/2015 from [facility]. It is a twin size bed. She checked her paperwork while I was speaking with her on the phone. Advised Julie Elliott I will contact [facility] again and see what her options are. I will call her back with an update once I have done this. She verbalized understanding.

## 2016-01-30 NOTE — Telephone Encounter (Signed)
What about a new overlay if her is defective?

## 2016-01-30 NOTE — Telephone Encounter (Signed)
Called Julie O. with [facility] and [facility]. Asked her to call back. Gave [facility] phone number.

## 2016-01-30 NOTE — Telephone Encounter (Signed)
Dr Jaynee Eagles- see below. Please advise  Called Gracy Racer at [facility]. She stated pt would qualify for low air loss mattress if pt has documented stage 3 or 4 pressure ulcers. She is going to fax me order form to [phone] to have on hand if Dr Jaynee Eagles thinks pt qualifies.

## 2016-01-31 ENCOUNTER — Ambulatory Visit: Payer: Medicare Other | Admitting: Physical Therapy

## 2016-01-31 DIAGNOSIS — H8111 Benign paroxysmal vertigo, right ear: Secondary | ICD-10-CM

## 2016-01-31 DIAGNOSIS — R2681 Unsteadiness on feet: Secondary | ICD-10-CM | POA: Diagnosis not present

## 2016-01-31 DIAGNOSIS — M6249 Contracture of muscle, multiple sites: Secondary | ICD-10-CM | POA: Diagnosis not present

## 2016-01-31 DIAGNOSIS — M6281 Muscle weakness (generalized): Secondary | ICD-10-CM | POA: Diagnosis not present

## 2016-01-31 DIAGNOSIS — G8250 Quadriplegia, unspecified: Secondary | ICD-10-CM | POA: Diagnosis not present

## 2016-01-31 DIAGNOSIS — R2689 Other abnormalities of gait and mobility: Secondary | ICD-10-CM | POA: Diagnosis not present

## 2016-01-31 NOTE — Patient Instructions (Signed)
   Sit with one leg extended onto facing chair.   Try to get knee as straight as possible (toes and knee straight - not pointing inward/outward); then hinged forward at waist to feel a stretch in the back of your leg. Hold for __60__ seconds, __2-3__ times per day on each leg.  SINGLE LIMB STANCE    Stand with your back to a corner with a stable chair in front of you. Raise one leg. Hold _10__ seconds. Repeat with other leg. Try this 8-10 times per day on each leg.  Functional Quadriceps: Sit to Stand    Sit on edge of chair, feet flat on floor. Scoot to edge of chair/bed. Be sure something stable is in front of you (in case you lose your balance forward). Try to stand up without using your hands. Repeat __10__ times per set. Do __2__ sets per day.

## 2016-01-31 NOTE — Telephone Encounter (Signed)
[facility] again. Advised pt would not qualify for low air loss mattress d/t not having stage 3 or 4 pressure ulcer.  She stated she is going to contact [facility] to have them contact the pt to see what is going on with her mattress. She will call back to give [place] an update tomorrow. I advised I will not be in the office, but Katrina, RN will be covering. She will be able to take message for Dr Jaynee Eagles. She verbalized understanding.

## 2016-01-31 NOTE — Therapy (Signed)
[facility] [address] [place], [place], [address] Phone: [phone]   Fax:  [phone]  Physical Therapy Treatment  Patient Details  Name: Julie Elliott MRN: [id number] Date of Birth: Oct 26, 1955 Referring Provider: Sarina Ill, MD  Encounter Date: 01/31/2016      PT End of Session - 01/31/16 1749    Visit Number 14   Number of Visits 17   Date for PT Re-Evaluation 02/28/16   Authorization Type Medicare Traditional primary; Medicaid secondary   Authorization Time Period G Codes and PN required every 10 visits   PT Start Time 1532   PT Stop Time 1623   PT Time Calculation (min) 51 min   Activity Tolerance Patient tolerated treatment well   Behavior During Therapy [facility] for tasks assessed/performed      Past Medical History  Diagnosis Date  . Hypertension     well controlled  . DJD (degenerative joint disease)   . HLD (hyperlipidemia) 2008    stable, well controlled  . DM (diabetes mellitus) ([place]) 2008    stable HgBA1C at 6.5  . GERD (gastroesophageal reflux disease)     well controlled on Omeprazole  . Chronic headaches   . CHF (congestive heart failure) [facility])     Past Surgical History  Procedure Laterality Date  . Tubal ligation    . Carpel tunnel Right     20 yrs ago  . Cervical spine surgery  2016    There were no vitals filed for this visit.          [facility] PT Assessment - 01/31/16 0001    AROM   Right Knee Extension -18   Left Knee Extension -12   Right/Left Ankle Left   Left Ankle Dorsiflexion -8            Vestibular Assessment - 01/31/16 0001    Positional Testing   Dix-Hallpike Dix-Hallpike Right   Dix-Hallpike Right   Dix-Hallpike Right Duration 8-second latency; nystagmus < 10 seconds   Dix-Hallpike Right Symptoms Upbeat, right rotatory nystagmus                 [facility] Adult PT Treatment/Exercise - 01/31/16 0001    Transfers   Transfers Sit to Stand;Stand to  Sit   Sit to Stand 5: Supervision   Sit to Stand Details (indicate cue type and reason) x3 reps from neutral height mat table without UE assist; required cueing for full anterior weight shift (pt reports fear of anterior LOB).   Stand to Sit 6: Modified independent (Device/Increase time)   Ambulation/Gait   Ambulation/Gait Yes   Ambulation/Gait Assistance 5: Supervision;6: Modified independent (Device/Increase time)   Ambulation/Gait Assistance Details x150' over level indoor surfaces without AD with (S). x100' over unlevel, paved surfaces with SBQC, mod I   Ambulation Distance (Feet) 250 Feet   Assistive device None;Small based quad cane   Gait Pattern Step-through pattern;Decreased arm swing - left;Decreased stride length;Decreased hip/knee flexion - right;Decreased hip/knee flexion - left;Decreased dorsiflexion - left;Left foot flat;Right flexed knee in stance;Left flexed knee in stance;Decreased trunk rotation;Wide base of support;Decreased dorsiflexion - right  limited B hip ext   Ambulation Surface Level;Unlevel;Indoor;Outdoor;Paved   Gait velocity 1.28 ft/sec   Door Management 5: Supervision   Door Managment Details (indicate cue type and reason) using SBQC   Ramp 6: Modified independent (Device)   Ramp Details (indicate cue type and reason) using SBQC   Curb 5: Supervision   Curb Details (indicate cue  type and reason) x3 trials, initially requiring cueing for sequencing/technique with [facility] with effective within-session carryover         Vestibular Treatment/Exercise - 01/31/16 0001    Vestibular Treatment/Exercise   Vestibular Treatment Provided Canalith Repositioning   Canalith Repositioning Epley Manuever Right    EPLEY MANUEVER RIGHT   Number of Reps  1   Overall Response Improved Symptoms   Response Details  Did not reassess due to time constraint            Balance Exercises - 01/31/16 1755    Balance Exercises: Standing   SLS Eyes open;Solid surface;Intermittent  upper extremity support;2 reps;10 secs           PT Education - 01/31/16 1757    Education provided Yes   Education Details Explained goals, progress. Reviewed sequencing/technique with curb step negotiation using SBQC. Updated HEP; see Pt Instructions.   Person(s) Educated Patient   Methods Explanation;Demonstration;Verbal cues;Handout   Comprehension Verbalized understanding;Returned demonstration          PT Short Term Goals - 01/31/16 1607    PT SHORT TERM GOAL #1   Title Pt will perform initial home stretching program with mod I using paper handout to indicate safe compliance with HEP.  (Target date: 01/08/16)   Status Achieved   PT SHORT TERM GOAL #2   Title Pt will improve active L ankle dorsiflexion from -12 degrees to -8 degrees to facilitate consistent L foot clearance during gait.  Modified due to therapist error on evaluation   Baseline 6/29: L ankle DF ARM = -8 degrees   Status Achieved   PT SHORT TERM GOAL #3   Title Pt will improve R knee extension AROM from -26 degrees to -20 degrees to progress toward more normalized gait pattern.    Baseline 6/1: R knee extension AROM = -19 degrees   Status Achieved   PT SHORT TERM GOAL #4   Title Pt will improve L knee extension AROM from -12 degrees to -7 degrees to progress toward more normalized gait pattern.    Baseline Met 6/5.   Status Achieved   PT SHORT TERM GOAL #5   Title Pt will consistently perform sit <> stand from standard chair with mod I, normal BOS, and symmetrical LE placement.   Baseline Met 6/5.   Status Achieved   PT SHORT TERM GOAL #6   Title Pt will negotiate 4 stairs with R rail with mod I using LRAD to indicate pt safety using primary home entrance.    Baseline Met 6/5.   Status Achieved   PT SHORT TERM GOAL #7   Title Pt will negotiate standard ramp and curb step with supervision using LRAD to progress toward independence with community obstacle negotiation.    Baseline Met 6/5.   Status  Achieved           PT Long Term Goals - 01/31/16 1538    PT LONG TERM GOAL #1   Title Pt will independently demo HEP to maximize functional gains made in PT.  (Target date: 02/05/16)   Baseline Met 6/27.   Status Achieved   PT LONG TERM GOAL #2   Title Pt will improve R knee extension AROM from -26 degrees to -15 degrees to promote more normalized gait pattern, increase energy/time efficiency of mobility.  (02/26/16)   Baseline 6/29: -18 degrees   Status Partially Met   PT LONG TERM GOAL #3   Title Pt will improve L knee extension  AROM from -12 degrees to -2 degrees to promote more normalized gait pattern, increase energy/time efficiency of mobility.  (02/26/16)   Baseline 6/29: -12 degrees   Status On-going   PT LONG TERM GOAL #4   Title Pt will improve gait velocity from 0.86 ft/sec to >/= 1.29 ft/sec to indicate significantly increased efficiency of ambulation. (02/26/16)   Baseline 1.28 ft/sec   Status Partially Met   PT LONG TERM GOAL #5   Title Pt will improve TUG time from 38.57 seconds to </= 28.17 seconds to indicate significant improvement in efficiency of mobility. (02/26/16)   Status On-going   PT LONG TERM GOAL #6   Title Pt will negotiate standard ramp and curb step with mod I using LRAD to indicate increased stability/independence with community obstacle negotiation. (02/26/16)   Baseline --   Status On-going   PT LONG TERM GOAL #7   Title Pt will ambulate >300' over level and unlevel indoor and (paved) outdoor surfaces with mod I using LRAD to indicate increased safety with community mobility.    Baseline Met 6/27.   Status Achieved   PT LONG TERM GOAL #8   Title Positional testing will be negative to indicate resovled BPPV.  (02/26/16)   Status On-going   PT LONG TERM GOAL  #9   TITLE Berg score will improve from 40/56 to >/= 45/56 to indicate decreased fall risk.  (02/26/16)   Status On-going               Plan - 01/31/16 1758    Clinical Impression  Statement R Dix-Hallpike again reveals R upbeating torsional nystagmus < 10 seconds with latency; symptoms improved since last session. Treated with R Epley x1 triall; did not reassess due to time constraint. Unable to provide habituation due to pt difficulty performing sit <> sidelying, rolling in home hospital bed. Will continue to assess/address. Remainder of session focused on assessing ROM goals and updating HEP. Pt met STG for L ankle DF AROM and has demonstrated improved R knee AROM, but not to goal-level.    Rehab Potential Good   Clinical Impairments Affecting Rehab Potential Excellent motivation, compliance, and progress with PT thusfar   PT Frequency Other (comment)  4 total sessions   PT Duration 4 weeks   PT Treatment/Interventions Orthotic Fit/Training;ADLs/Self Care Home Management;Gait training;Stair training;Functional mobility training;Therapeutic activities;Moist Heat;Therapeutic exercise;Balance training;Patient/family education;Neuromuscular re-education;Passive range of motion;Manual techniques;Canalith Repostioning;Vestibular   PT Next Visit Plan Reassess for BPPV (R PC).  Expand on HEP for standing balance. Schedule 1 additional PT session (held 3:30 spot on 7/27)   Consulted and Agree with Plan of Care Patient      Patient will benefit from skilled therapeutic intervention in order to improve the following deficits and impairments:  Abnormal gait, Decreased knowledge of use of DME, Decreased balance, Decreased range of motion, Impaired flexibility, Postural dysfunction, Impaired tone, Decreased strength, Impaired UE functional use, Decreased coordination, Dizziness  Visit Diagnosis: Other abnormalities of gait and mobility  Unsteadiness on feet  BPPV (benign paroxysmal positional vertigo), right  Contracture of muscle, multiple sites     Problem List Patient Active Problem List   Diagnosis Date Noted  . 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 Tobacco abuse  08/16/2015  . Peripheral neuropathy ([place]) 07/17/2015  . Cervical cord compression with myelopathy ([place]) 02/08/2015  . Frozen shoulder--left 09/05/2014  . Unintentional weight loss 06/20/2014  . Health care maintenance 03/23/2012  . Hypertension associated with diabetes ([place]) 08/17/2006  . GERD 08/17/2006  .  History of diabetes mellitus, type II 08/11/2006  . Hyperlipidemia associated with type 2 diabetes mellitus ([place]) 08/11/2006  . Osteoarthritis 08/11/2006    Billie Ruddy, PT, DPT [facility] [address] [place], [place], [address] Phone: [phone]   Fax:  [phone] 01/31/2016, 6:06 PM  Name: Julie Elliott MRN: [id number] Date of Birth: 09/20/1955

## 2016-02-06 ENCOUNTER — Ambulatory Visit: Payer: Medicare Other | Attending: Physical Medicine & Rehabilitation | Admitting: Physical Therapy

## 2016-02-06 DIAGNOSIS — R2689 Other abnormalities of gait and mobility: Secondary | ICD-10-CM

## 2016-02-06 DIAGNOSIS — M6249 Contracture of muscle, multiple sites: Secondary | ICD-10-CM | POA: Diagnosis not present

## 2016-02-06 DIAGNOSIS — H8111 Benign paroxysmal vertigo, right ear: Secondary | ICD-10-CM | POA: Diagnosis not present

## 2016-02-06 DIAGNOSIS — R2681 Unsteadiness on feet: Secondary | ICD-10-CM

## 2016-02-06 NOTE — Patient Instructions (Signed)
Tip Card 1.The goal of habituation training is to assist in decreasing symptoms of vertigo, dizziness, or nausea provoked by specific head and body motions. 2.These exercises may initially increase symptoms; however, be persistent and work through symptoms. With repetition and time, the exercises will assist in reducing or eliminating symptoms. 3.Exercises should be stopped and discussed with the therapist if you experience any of the following: - Sudden change or fluctuation in hearing - New onset of ringing in the ears, or increase in current intensity - Any fluid discharge from the ear - Severe pain in neck or back - Extreme nausea   Copyright  VHI. All rights reserved.  Sit to Side-Lying   Sit on edge of bed. Lie down onto the right side and hold until dizziness stops, plus 20 seconds.  Return to sitting and wait until dizziness stops, plus 20 seconds.  Repeat to the left side. Repeat sequence 5 times per session. Do 2 sessions per day.  Feet Apart (Compliant Surface) Head Motion - Eyes Closed    Stand with your back to a corner with a stable chair in front of you. Stand on 1 pillow with feet shoulder width apart. Close eyes and move head slowly: up and down 5 times; right to left 5 times. Repeat _2-3___ times per day.  Copyright  VHI. All rights reserved.     Sit with one leg extended onto facing chair.   Try to get knee as straight as possible (toes and knee straight - not pointing inward/outward); then hinged forward at waist to feel a stretch in the back of your leg. Hold for __60__ seconds, __2-3__ times per day on each leg.  SINGLE LIMB STANCE    Stand with your back to a corner with a stable chair in front of you. Raise one leg. Hold _10__ seconds. Repeat with other leg. Try this 8-10 times per day on each leg.  Functional Quadriceps: Sit to Stand    Sit on edge of chair, feet flat on floor. Scoot to edge of chair/bed. Be sure something stable is in front of you  (in case you lose your balance forward). Try to stand up without using your hands. Repeat __10__ times per set. Do __2__ sets per day.

## 2016-02-06 NOTE — Telephone Encounter (Signed)
[facility] again since I have not heard back. Was calling to see if she had an update about mattress. LVM for her to call. Gave [facility] phone number.

## 2016-02-06 NOTE — Telephone Encounter (Signed)
Dr Jaynee Eagles- I sent you a staff message with [facility] reply. Pt does not qualify for anything new. See staff message.

## 2016-02-06 NOTE — Telephone Encounter (Signed)
Julie Elliott, let Ms. Shird know we tried really hard but couldn't get her a new mattress, we are so sorry.

## 2016-02-06 NOTE — Therapy (Signed)
[facility] [address] [place], [place], [address] Phone: [phone]   Fax:  [phone]  Physical Therapy Treatment  Patient Details  Name: Julie Elliott MRN: [id number] Date of Birth: 1955/12/04 Referring Provider: Sarina Ill, MD  Encounter Date: 02/06/2016      PT End of Session - 02/06/16 1815    Visit Number 15   Number of Visits 17   Date for PT Re-Evaluation 02/28/16   Authorization Type Medicare Traditional primary; Medicaid secondary   Authorization Time Period G Codes and PN required every 10 visits   PT Start Time 1535  Pt arrived late to session   PT Stop Time 1617   PT Time Calculation (min) 42 min   Equipment Utilized During Treatment Gait belt   Activity Tolerance Patient tolerated treatment well   Behavior During Therapy [facility] for tasks assessed/performed      Past Medical History  Diagnosis Date  . Hypertension     well controlled  . DJD (degenerative joint disease)   . HLD (hyperlipidemia) 2008    stable, well controlled  . DM (diabetes mellitus) ([place]) 2008    stable HgBA1C at 6.5  . GERD (gastroesophageal reflux disease)     well controlled on Omeprazole  . Chronic headaches   . CHF (congestive heart failure) [name])     Past Surgical History  Procedure Laterality Date  . Tubal ligation    . Carpel tunnel Right     20 yrs ago  . Cervical spine surgery  2016    There were no vitals filed for this visit.      Subjective Assessment - 02/06/16 1806    Subjective Pt reports no significant changes, denies falls. Reports she is wearing PRAFO's every night. 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 No/denies                Vestibular Assessment - 02/06/16 0001    Positional Testing   Sidelying Test Sidelying Right;Sidelying Left   Sidelying Right   Sidelying Right Duration Approx. 5 seconds   Sidelying Right Symptoms Upbeat, right rotatory nystagmus   Sidelying Left   Sidelying Left Duration NA   Sidelying Left Symptoms No nystagmus                 [facility] Adult PT Treatment/Exercise - 02/06/16 0001    Transfers   Transfers Sit to Stand;Stand to Sit   Sit to Stand 6: Modified independent (Device/Increase time)   Sit to Stand Details (indicate cue type and reason) x10 reps; cueing for more narrow BOS  from neutral height mat table   Stand to Sit 6: Modified independent (Device/Increase time)   Ambulation/Gait   Ambulation/Gait Yes   Ambulation/Gait Assistance 5: Supervision   Ambulation/Gait Assistance Details without AD; cueing for increased B hip extension throughout gait cycle; cueing for increased L hip/knee flexion during LLE advancement and for L heel strike   Ambulation Distance (Feet) 220 Feet   Assistive device None   Gait Pattern Step-through pattern;Decreased arm swing - left;Decreased stride length;Decreased hip/knee flexion - left;Decreased dorsiflexion -  left;Left foot flat;Right flexed knee in stance;Left flexed knee in stance;Decreased trunk rotation  limited B hip ext   Ambulation Surface Level;Indoor   Stairs Yes   Stairs Assistance 6: Modified independent (Device/Increase time)   Stairs Assistance Details (indicate cue type and reason) Ascends with reciprocal pattern, descends with step-to pattern   Stair Management Technique One rail Right;Alternating pattern;Step to pattern;Forwards   Number of Stairs 8   Height of Stairs 6         Vestibular Treatment/Exercise - 02/06/16 0001    Vestibular Treatment/Exercise   Vestibular Treatment  Provided Habituation   Habituation Exercises Legrand Como Daroff   Number of Reps  2   Symptom Description  PT provided cueing for technique with habituation exercise with effective return demo from pt.            Balance Exercises - 02/06/16 1809    Balance Exercises: Standing   Standing Eyes Closed Wide (BOA);Foam/compliant surface;Head turns;5 reps  1 pillow; horiz, vertical head turns x5 each   SLS Eyes open;Solid surface;Intermittent upper extremity support;10 secs;3 reps   Rockerboard Anterior/posterior;EO;UE support;10 seconds  large board at // bars; difficulty removing UE support   Retro Gait 2 reps;Other (comment)  2 x50'           PT Education - 02/06/16 1807    Education provided Yes   Education Details HEP: added Nestor Lewandowsky for habituation as well as corner balance for vestibular reliance.    Person(s) Educated Patient   Methods Demonstration;Explanation;Handout;Verbal cues   Comprehension Returned demonstration;Verbalized understanding          PT Short Term Goals - 01/31/16 1607    PT SHORT TERM GOAL #1   Title Pt will perform initial home stretching program with mod I using paper handout to indicate safe compliance with HEP.  (Target date: 01/08/16)   Status Achieved   PT SHORT TERM GOAL #2   Title Pt will improve active L ankle dorsiflexion from -12 degrees to -8 degrees to facilitate consistent L foot clearance during gait.  Modified due to therapist error on evaluation   Baseline 6/29: L ankle DF ARM = -8 degrees   Status Achieved   PT SHORT TERM GOAL #3   Title Pt will improve R knee extension AROM from -26 degrees to -20 degrees to progress toward more normalized gait pattern.    Baseline 6/1: R knee extension AROM = -19 degrees   Status Achieved   PT SHORT TERM GOAL #4   Title Pt will improve L knee extension AROM from -12 degrees to -7 degrees to progress toward more normalized gait pattern.    Baseline Met 6/5.   Status Achieved    PT SHORT TERM GOAL #5   Title Pt will consistently perform sit <> stand from standard chair with mod I, normal BOS, and symmetrical LE placement.   Baseline Met 6/5.   Status Achieved   PT SHORT TERM GOAL #6   Title Pt will negotiate 4 stairs with R rail with mod I using LRAD to indicate pt safety using primary home entrance.    Baseline Met 6/5.   Status Achieved   PT SHORT TERM GOAL #7   Title Pt will negotiate standard ramp and curb step with supervision using LRAD to progress toward independence with community obstacle negotiation.    Baseline Met 6/5.   Status Achieved           PT Long Term Goals -  01/31/16 1538    PT LONG TERM GOAL #1   Title Pt will independently demo HEP to maximize functional gains made in PT.  (Target date: 02/05/16)   Baseline Met 6/27.   Status Achieved   PT LONG TERM GOAL #2   Title Pt will improve R knee extension AROM from -26 degrees to -15 degrees to promote more normalized gait pattern, increase energy/time efficiency of mobility.  (02/26/16)   Baseline 6/29: -18 degrees   Status Partially Met   PT LONG TERM GOAL #3   Title Pt will improve L knee extension AROM from -12 degrees to -2 degrees to promote more normalized gait pattern, increase energy/time efficiency of mobility.  (02/26/16)   Baseline 6/29: -12 degrees   Status On-going   PT LONG TERM GOAL #4   Title Pt will improve gait velocity from 0.86 ft/sec to >/= 1.29 ft/sec to indicate significantly increased efficiency of ambulation. (02/26/16)   Baseline 1.28 ft/sec   Status Partially Met   PT LONG TERM GOAL #5   Title Pt will improve TUG time from 38.57 seconds to </= 28.17 seconds to indicate significant improvement in efficiency of mobility. (02/26/16)   Status On-going   PT LONG TERM GOAL #6   Title Pt will negotiate standard ramp and curb step with mod I using LRAD to indicate increased stability/independence with community obstacle negotiation. (02/26/16)   Baseline --   Status  On-going   PT LONG TERM GOAL #7   Title Pt will ambulate >300' over level and unlevel indoor and (paved) outdoor surfaces with mod I using LRAD to indicate increased safety with community mobility.    Baseline Met 6/27.   Status Achieved   PT LONG TERM GOAL #8   Title Positional testing will be negative to indicate resovled BPPV.  (02/26/16)   Status On-going   PT LONG TERM GOAL  #9   TITLE Berg score will improve from 40/56 to >/= 45/56 to indicate decreased fall risk.  (02/26/16)   Status On-going               Plan - 02/06/16 1816    Clinical Impression Statement Session focused on expanding on current HEP to add vestibular exercises. R Dix-Hallpike reveals continued R upbeating torsional nystagmus for approx. 5 seconds. Educated pt on [place] for habituation and pt was able to safely and independently perform during this session. Added corner balanace exercise to HEP for increased vestibular reliance. Pt able to ascend stairs with reciprocal pattern for the first time during this session. Continues to demo postural/gait impairments associated with multiple contractures.    Rehab Potential Good   Clinical Impairments Affecting Rehab Potential Excellent motivation, compliance, and progress with PT thusfar   PT Frequency Other (comment)  4 total sessions   PT Duration 4 weeks   PT Treatment/Interventions Orthotic Fit/Training;ADLs/Self Care Home Management;Gait training;Stair training;Functional mobility training;Therapeutic activities;Moist Heat;Therapeutic exercise;Balance training;Patient/family education;Neuromuscular re-education;Passive range of motion;Manual techniques;Canalith Repostioning;Vestibular   PT Next Visit Plan Dynamic standing balance at // bars. Dynamic gait activities. Obstacle negotiation without AD.   Consulted and Agree with Plan of Care Patient      Patient will benefit from skilled therapeutic intervention in order to improve the following deficits and  impairments:  Abnormal gait, Decreased knowledge of use of DME, Decreased balance, Decreased range of motion, Impaired flexibility, Postural dysfunction, Impaired tone, Decreased strength, Impaired UE functional use, Decreased coordination, Dizziness  Visit Diagnosis: Other abnormalities of gait and mobility  Unsteadiness on feet  BPPV (benign paroxysmal positional vertigo), right     Problem List Patient Active Problem List   Diagnosis Date Noted  . Spastic quadriparesis ([place]) 11/02/2015  . Tobacco abuse 08/16/2015  . Peripheral neuropathy ([place]) 07/17/2015  . Cervical cord compression with myelopathy ([place]) 02/08/2015  . Frozen shoulder--left 09/05/2014  . Unintentional weight loss 06/20/2014  . Health care maintenance 03/23/2012  . Hypertension associated with diabetes ([place]) 08/17/2006  . GERD 08/17/2006  . History of diabetes mellitus, type II 08/11/2006  . Hyperlipidemia associated with type 2 diabetes mellitus ([place]) 08/11/2006  . Osteoarthritis 08/11/2006   Billie Ruddy, PT, DPT [facility] [address] [place], [place], [address] Phone: [phone]   Fax:  [phone] 02/06/2016, 6:21 PM  Name: Julie Elliott MRN: [id number] Date of Birth: 09-20-1955

## 2016-02-07 ENCOUNTER — Ambulatory Visit: Payer: Medicare Other | Admitting: Physical Therapy

## 2016-02-07 DIAGNOSIS — M6249 Contracture of muscle, multiple sites: Secondary | ICD-10-CM | POA: Diagnosis not present

## 2016-02-07 DIAGNOSIS — H8111 Benign paroxysmal vertigo, right ear: Secondary | ICD-10-CM | POA: Diagnosis not present

## 2016-02-07 DIAGNOSIS — R2689 Other abnormalities of gait and mobility: Secondary | ICD-10-CM

## 2016-02-07 DIAGNOSIS — R2681 Unsteadiness on feet: Secondary | ICD-10-CM | POA: Diagnosis not present

## 2016-02-07 NOTE — Therapy (Signed)
[facility] [address] [place], [place], [address] Phone: [phone]   Fax:  [phone]  Physical Therapy Treatment  Patient Details  Name: Julie Elliott MRN: [id number] Date of Birth: 05/17/1956 Referring Provider: Sarina Ill, MD  Encounter Date: 02/07/2016      PT End of Session - 02/07/16 1811    Visit Number 16   Number of Visits 17   Date for PT Re-Evaluation 02/28/16   Authorization Type Medicare Traditional primary; Medicaid secondary   Authorization Time Period G Codes and PN required every 10 visits   PT Start Time 1532   PT Stop Time 1611   PT Time Calculation (min) 39 min   Activity Tolerance Patient tolerated treatment well   Behavior During Therapy [facility] for tasks assessed/performed      Past Medical History  Diagnosis Date  . Hypertension     well controlled  . DJD (degenerative joint disease)   . HLD (hyperlipidemia) 2008    stable, well controlled  . DM (diabetes mellitus) ([place]) 2008    stable HgBA1C at 6.5  . GERD (gastroesophageal reflux disease)     well controlled on Omeprazole  . Chronic headaches   . CHF (congestive heart failure) [facility])     Past Surgical History  Procedure Laterality Date  . Tubal ligation    . Carpel tunnel Right     20 yrs ago  . Cervical spine surgery  2016    There were no vitals filed for this visit.      Subjective Assessment - 02/07/16 1538    Subjective "It's better, but I really need to get fab for D.C.," states patient (of upcoming family reunion.   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 No/denies            [facility] PT Assessment - 02/07/16 0001    AROM    Right/Left Knee Right;Left   Right Knee Extension -15   Left Knee Extension -9            Vestibular Assessment - 02/07/16 0001    Sidelying Right   Sidelying Right Duration NA   Sidelying Right Symptoms No nystagmus   Sidelying Left   Sidelying Left Duration NA   Sidelying Left Symptoms No nystagmus                 [facility] Adult PT Treatment/Exercise - 02/07/16 0001    Standardized Balance Assessment   Standardized Balance Assessment Timed Up and Go Test   Timed Up and Go Test   TUG Normal TUG   Normal TUG (seconds) 16.83   Manual Therapy   Manual Therapy Joint mobilization   Joint Mobilization Joint mobilizations of the following joints (6 reps x10-second holds each): A/P of L femur (grades I-IV), A/P of R femur (grades I-IV) for increased knee extension ROM.         Vestibular Treatment/Exercise - 02/07/16 0001    Vestibular Treatment/Exercise   Vestibular Treatment Provided Habituation   Habituation Exercises Legrand Como Daroff   Number of Reps  1   Symptom Description  Reviewed exercise x1 rep per direction. Pt exhibited effective between-session carryover. Recommended pt perform daily until 3 consecutive  days without symptoms.            Balance Exercises - 02/07/16 1813    Balance Exercises: Standing   Standing Eyes Opened Wide (BOA);Head turns;Foam/compliant surface;5 reps  Airex foam at // bars; vert, horiz, diag turns x5 each   Balance Beam Static standing on blue foam beam at // bars 2 x10, x20 seconds with min guard. Noted tendency toward posterior LOB.           PT Education - 02/07/16 1816    Education provided Yes   Education Details HEP: reviewed, progressed current HEP and added gait with head turns. Educated pt on goals, findings, and progress.   Person(s) Educated Patient   Methods Explanation;Demonstration;Handout;Verbal cues   Comprehension Verbalized understanding;Returned demonstration          PT Short Term  Goals - 01/31/16 1607    PT SHORT TERM GOAL #1   Title Pt will perform initial home stretching program with mod I using paper handout to indicate safe compliance with HEP.  (Target date: 01/08/16)   Status Achieved   PT SHORT TERM GOAL #2   Title Pt will improve active L ankle dorsiflexion from -12 degrees to -8 degrees to facilitate consistent L foot clearance during gait.  Modified due to therapist error on evaluation   Baseline 6/29: L ankle DF ARM = -8 degrees   Status Achieved   PT SHORT TERM GOAL #3   Title Pt will improve R knee extension AROM from -26 degrees to -20 degrees to progress toward more normalized gait pattern.    Baseline 6/1: R knee extension AROM = -19 degrees   Status Achieved   PT SHORT TERM GOAL #4   Title Pt will improve L knee extension AROM from -12 degrees to -7 degrees to progress toward more normalized gait pattern.    Baseline Met 6/5.   Status Achieved   PT SHORT TERM GOAL #5   Title Pt will consistently perform sit <> stand from standard chair with mod I, normal BOS, and symmetrical LE placement.   Baseline Met 6/5.   Status Achieved   PT SHORT TERM GOAL #6   Title Pt will negotiate 4 stairs with R rail with mod I using LRAD to indicate pt safety using primary home entrance.    Baseline Met 6/5.   Status Achieved   PT SHORT TERM GOAL #7   Title Pt will negotiate standard ramp and curb step with supervision using LRAD to progress toward independence with community obstacle negotiation.    Baseline Met 6/5.   Status Achieved           PT Long Term Goals - 02/07/16 1547    PT LONG TERM GOAL #1   Title Pt will independently demo HEP to maximize functional gains made in PT.  (Target date: 02/05/16)   Baseline Met 6/27.   Status Achieved   PT LONG TERM GOAL #2   Title Pt will improve R knee extension AROM from -26 degrees to -15 degrees to promote more normalized gait pattern, increase energy/time efficiency of mobility.  (02/26/16)   Baseline 7/6: -15  degrees   Status Achieved   PT LONG TERM GOAL #3   Title Pt will improve L knee extension AROM from -12 degrees to -2 degrees to promote more normalized gait pattern, increase energy/time efficiency of mobility.  (02/26/16)   Baseline 7/6: -9 degrees   Status Partially Met   PT LONG TERM GOAL #4  Title Pt will improve gait velocity from 0.86 ft/sec to >/= 1.29 ft/sec to indicate significantly increased efficiency of ambulation. (02/26/16)   Baseline 1.28 ft/sec   Status Partially Met   PT LONG TERM GOAL #5   Title Pt will improve TUG time from 38.57 seconds to </= 28.17 seconds to indicate significant improvement in efficiency of mobility. (02/26/16)   Baseline 7/6: TUG = 16.83 seconds without AD   Status Achieved   PT LONG TERM GOAL #6   Title Pt will negotiate standard ramp and curb step with mod I using LRAD to indicate increased stability/independence with community obstacle negotiation. (02/26/16)   Status On-going   PT LONG TERM GOAL #7   Title Pt will ambulate >300' over level and unlevel indoor and (paved) outdoor surfaces with mod I using LRAD to indicate increased safety with community mobility.    Baseline Met 6/27.   Status Achieved   PT LONG TERM GOAL #8   Title Positional testing will be negative to indicate resovled BPPV.  (02/26/16)   Baseline Met 7/6.   Status Achieved   PT LONG TERM GOAL  #9   TITLE Berg score will improve from 40/56 to >/= 45/56 to indicate decreased fall risk.  (02/26/16)   Status On-going               Plan - 02/07/16 1817    Clinical Impression Statement Pt met LTG for R knee extension AROM and surpassed LTG for TUG time. Positional testing (-) and asymptomatic, suggesting BPPV now cleared. Remainder of session focused on reviewing, progressing current HEP, improved postural stability/control with head turns on dynamic surfaces. Pt  proficient with HEP for dynamic gait, balance. Will reassess remaining goals (emphasis on Berg) when pt returns  to PT in 3 weeks. Pt in full agreement.    Rehab Potential Good   Clinical Impairments Affecting Rehab Potential Excellent motivation, compliance, and progress with PT thusfar   PT Frequency Other (comment)  4 total sessions   PT Duration 4 weeks   PT Treatment/Interventions Orthotic Fit/Training;ADLs/Self Care Home Management;Gait training;Stair training;Functional mobility training;Therapeutic activities;Moist Heat;Therapeutic exercise;Balance training;Patient/family education;Neuromuscular re-education;Passive range of motion;Manual techniques;Canalith Repostioning;Vestibular   PT Next Visit Plan Reassess LTG's Merrilee Jansky) and determine POC (DC vs. recert 1x/week for 4 weeks)   Consulted and Agree with Plan of Care Patient      Patient will benefit from skilled therapeutic intervention in order to improve the following deficits and impairments:  Abnormal gait, Decreased knowledge of use of DME, Decreased balance, Decreased range of motion, Impaired flexibility, Postural dysfunction, Impaired tone, Decreased strength, Impaired UE functional use, Decreased coordination, Dizziness  Visit Diagnosis: Other abnormalities of gait and mobility  Unsteadiness on feet  Contracture of muscle, multiple sites     Problem List Patient Active Problem List   Diagnosis Date Noted  . Spastic quadriparesis ([place]) 11/02/2015  . Tobacco abuse 08/16/2015  . Peripheral neuropathy ([place]) 07/17/2015  . Cervical cord compression with myelopathy ([place]) 02/08/2015  . Frozen shoulder--left 09/05/2014  . Unintentional weight loss 06/20/2014  . Health care maintenance 03/23/2012  . Hypertension associated with diabetes ([place]) 08/17/2006  . GERD 08/17/2006  . History of diabetes mellitus, type II 08/11/2006  . Hyperlipidemia associated with type 2 diabetes mellitus ([place]) 08/11/2006  . Osteoarthritis 08/11/2006  ] Billie Ruddy, PT, DPT [facility] [address] [place], [place], [address] Phone: [phone]   Fax:  [phone] 02/07/2016, 6:22 PM  Name: Fraida Veldman  MRN: [id number] Date of Birth: Jul 27, 1956

## 2016-02-07 NOTE — Patient Instructions (Signed)
Tip Card 1.The goal of habituation training is to assist in decreasing symptoms of vertigo, dizziness, or nausea provoked by specific head and body motions. 2.These exercises may initially increase symptoms; however, be persistent and work through symptoms. With repetition and time, the exercises will assist in reducing or eliminating symptoms. 3.Exercises should be stopped and discussed with the therapist if you experience any of the following: - Sudden change or fluctuation in hearing - New onset of ringing in the ears, or increase in current intensity - Any fluid discharge from the ear - Severe pain in neck or back - Extreme nausea   Copyright  VHI. All rights reserved.  Sit to Side-Lying   Sit on edge of bed. Lie down onto the right side and hold until dizziness stops, plus 20 seconds. Return to sitting and wait until dizziness stops, plus 20 seconds. Repeat to the left side. Repeat sequence 5 times per session. Do 2 sessions per day. Julie Elliott, you may stop doing this exercise if you have no symptoms with it for 3 consecutive days.  Feet Together (Compliant Surface) Head Motion - Eyes Closed    Stand with your back to a corner with a stable chair in front of you. Stand on 1 pillow with feet together. Close eyes and move head slowly: up and down 10 times; right to left 10 times. Repeat _2___ times per day.  SINGLE LIMB STANCE    Stand with your back to a corner with a stable chair in front of you. Raise one leg. Hold _10__ seconds. Repeat with other leg. Try this 8-10 times per day on each leg.  Functional Quadriceps: Sit to Stand    Sit on edge of chair, feet flat on floor. Scoot to edge of chair/bed. Be sure something stable is in front of you (in case you lose your balance forward). Try to stand up without using your hands. Repeat __10__ times per set. Do __2__ sets per day.   Walking Head Turn    Standing close to a wall, walk the length of your hallway while turning  head side to side. Touch wall if necessary to keep balance. Perform this exercise for 3-5 minutes per day.

## 2016-02-07 NOTE — Telephone Encounter (Signed)
Accidentally dialed number for [facility] with [facility]. Did not LVM. Spoke with patient and informed her that Dr Jaynee Eagles attempted to get her a new mattress but was unable.  She stated she "would figure something out". Patient verbalized understanding, appreciation for efforts.

## 2016-02-12 ENCOUNTER — Encounter: Payer: Self-pay | Admitting: Physical Medicine & Rehabilitation

## 2016-02-12 ENCOUNTER — Ambulatory Visit (HOSPITAL_BASED_OUTPATIENT_CLINIC_OR_DEPARTMENT_OTHER): Payer: Medicare Other | Admitting: Physical Medicine & Rehabilitation

## 2016-02-12 ENCOUNTER — Encounter: Payer: Medicare Other | Attending: Physical Medicine & Rehabilitation

## 2016-02-12 VITALS — BP 123/83 | HR 86 | Resp 14

## 2016-02-12 DIAGNOSIS — E785 Hyperlipidemia, unspecified: Secondary | ICD-10-CM | POA: Insufficient documentation

## 2016-02-12 DIAGNOSIS — R51 Headache: Secondary | ICD-10-CM | POA: Diagnosis not present

## 2016-02-12 DIAGNOSIS — Z9889 Other specified postprocedural states: Secondary | ICD-10-CM | POA: Diagnosis not present

## 2016-02-12 DIAGNOSIS — G8250 Quadriplegia, unspecified: Secondary | ICD-10-CM | POA: Diagnosis not present

## 2016-02-12 DIAGNOSIS — M1990 Unspecified osteoarthritis, unspecified site: Secondary | ICD-10-CM | POA: Diagnosis not present

## 2016-02-12 DIAGNOSIS — K219 Gastro-esophageal reflux disease without esophagitis: Secondary | ICD-10-CM | POA: Insufficient documentation

## 2016-02-12 DIAGNOSIS — E119 Type 2 diabetes mellitus without complications: Secondary | ICD-10-CM | POA: Diagnosis not present

## 2016-02-12 DIAGNOSIS — F17210 Nicotine dependence, cigarettes, uncomplicated: Secondary | ICD-10-CM | POA: Insufficient documentation

## 2016-02-12 DIAGNOSIS — I10 Essential (primary) hypertension: Secondary | ICD-10-CM | POA: Insufficient documentation

## 2016-02-12 DIAGNOSIS — I509 Heart failure, unspecified: Secondary | ICD-10-CM | POA: Diagnosis not present

## 2016-02-12 NOTE — Patient Instructions (Signed)

## 2016-02-12 NOTE — Progress Notes (Signed)
Botox Injection for spasticity using needle EMG guidance  Dilution: 50 Units/ml Indication: Severe spasticity which interferes with ADL,mobility and/or  hygiene and is unresponsive to medication management and other conservative care Informed consent was obtained after describing risks and benefits of the procedure with the patient. This includes bleeding, bruising, infection, excessive weakness, or medication side effects. A REMS form is on file and signed. Needle: 27g 1" needle electrode Number of units per muscle FDS 50 FDP 50 FCU 25 FCR 50 PT 25 Pect 200 All injections were done after obtaining appropriate EMG activity and after negative drawback for blood. The patient tolerated the procedure well. Post procedure instructions were given. A followup appointment was made.   Evaluated her gait pattern, she does have some plantar flexor spasticity bilaterally with some tendency towards foot inversion. Would consider Botox however she is already at maximum dose and would have to reduce upper extremity dosages considerably, we will get PT input

## 2016-02-28 ENCOUNTER — Ambulatory Visit: Payer: Medicare Other | Admitting: Physical Therapy

## 2016-02-28 DIAGNOSIS — M6249 Contracture of muscle, multiple sites: Secondary | ICD-10-CM | POA: Diagnosis not present

## 2016-02-28 DIAGNOSIS — R2681 Unsteadiness on feet: Secondary | ICD-10-CM | POA: Diagnosis not present

## 2016-02-28 DIAGNOSIS — H8111 Benign paroxysmal vertigo, right ear: Secondary | ICD-10-CM | POA: Diagnosis not present

## 2016-02-28 DIAGNOSIS — R2689 Other abnormalities of gait and mobility: Secondary | ICD-10-CM | POA: Diagnosis not present

## 2016-02-28 NOTE — Therapy (Signed)
[facility] [address] [place], [place], [address] Phone: [phone]   Fax:  [phone]  Physical Therapy Treatment and Discharge Summary  Patient Details  Name: Julie Elliott MRN: [id number] Date of Birth: 1955-08-07 Referring Provider: Sarina Ill, MD  Encounter Date: 02/28/2016      PT End of Session - 02/28/16 1625    Visit Number 17   Number of Visits 17   Date for PT Re-Evaluation 02/28/16   Authorization Type Medicare Traditional primary; Medicaid secondary   Authorization Time Period G Codes and PN required every 10 visits   PT Start Time 1533   PT Stop Time 1612  Session ended early due to goals met, patient D/C'ed   PT Time Calculation (min) 39 min   Activity Tolerance Patient tolerated treatment well   Behavior During Therapy [facility] for tasks assessed/performed      Past Medical History:  Diagnosis Date  . CHF (congestive heart failure) ([place])   . Chronic headaches   . DJD (degenerative joint disease)   . DM (diabetes mellitus) ([place]) 2008   stable HgBA1C at 6.5  . GERD (gastroesophageal reflux disease)    well controlled on Omeprazole  . HLD (hyperlipidemia) 2008   stable, well controlled  . Hypertension    well controlled    Past Surgical History:  Procedure Laterality Date  . carpel tunnel Right    20 yrs ago  . CERVICAL SPINE SURGERY  2016  . TUBAL LIGATION      There were no vitals filed for this visit.      Subjective Assessment - 02/28/16 1538    Subjective Pt reports no falls, no significant changes. Has been stretching a lot at home. Doesn't feel as though she has any further goals in terms of mobility; wants to focus on her hand function.   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 No/denies            [facility] PT Assessment - 02/28/16 0001      Berg Balance Test   Sit to Stand Able to stand  independently using hands   Standing Unsupported Able to stand safely 2 minutes   Sitting with Back Unsupported but Feet Supported on Floor or Stool Able to sit safely and securely 2 minutes   Stand to Sit Sits safely with minimal use of hands   Transfers Able to transfer safely, definite need of hands   Standing Unsupported with Eyes Closed Able to stand 10 seconds safely   Standing Ubsupported with Feet Together Able to place feet together independently and stand 1 minute safely   From Standing, Reach Forward with Outstretched Arm Can reach confidently >25 cm (10")   From Standing Position, Pick up Object from Floor Able to pick up shoe safely and easily   From Standing Position, Turn to Look Behind Over each Shoulder Looks behind from both sides and weight shifts well   Turn 360 Degrees Able to turn 360 degrees safely but slowly   Standing Unsupported, Alternately Place Feet on Step/Stool Able to stand independently and safely and complete 8 steps in 20 seconds   Standing Unsupported, One Foot in Front Able to plae foot ahead of the other  independently and hold 30 seconds   Standing on One Leg Able to lift leg independently and hold 5-10 seconds  8.5 seconds   Total Score 50                     [facility] Adult PT Treatment/Exercise - 02/28/16 0001      Transfers   Transfers Sit to Stand;Stand to Sit   Sit to Stand 6: Modified independent (Device/Increase time)   Stand to Sit 6: Modified independent (Device/Increase time)     Ambulation/Gait   Ambulation/Gait Yes   Ambulation/Gait Assistance 6: Modified independent (Device/Increase time)   Ambulation/Gait Assistance Details Mod I gait over level, indoor surfaces without AD and over unlevel, paved surfaces with [facility]   Ambulation  Distance (Feet) 250 Feet   Assistive device Small based quad cane;None   Gait Pattern Step-through pattern;Decreased arm swing - left;Decreased stride length;Decreased hip/knee flexion - left;Decreased dorsiflexion - left;Left foot flat;Right flexed knee in stance;Left flexed knee in stance;Decreased trunk rotation  limited B hip ext   Ambulation Surface Level;Indoor   Ramp 6: Modified independent (Device)   Ramp Details (indicate cue type and reason) using [facility]   Curb 6: Modified independent (Device/increase time)   Curb Details (indicate cue type and reason) using [facility]             Balance Exercises - 02/28/16 1624      Balance Exercises: Standing   Standing Eyes Closed Narrow base of support (BOS);Head turns;Foam/compliant surface;5 reps;Other (comment)  1 pillow; horizontal, vertical head turns x5 each           PT Education - 02/28/16 1613    Education provided Yes   Education Details PT goals, findings, progress, and DC plan.   Person(s) Educated Patient   Methods Explanation   Comprehension Verbalized understanding          PT Short Term Goals - 01/31/16 1607      PT SHORT TERM GOAL #1   Title Pt will perform initial home stretching program with mod I using paper handout to indicate safe compliance with HEP.  (Target date: 01/08/16)   Status Achieved     PT SHORT TERM GOAL #2   Title Pt will improve active L ankle dorsiflexion from -12 degrees to -8 degrees to facilitate consistent L foot clearance during gait.  Modified due to therapist error on evaluation   Baseline 6/29: L ankle DF ARM = -8 degrees   Status Achieved     PT SHORT TERM GOAL #3   Title Pt will improve R knee extension AROM from -26 degrees to -20 degrees to progress toward more normalized gait pattern.    Baseline 6/1: R knee extension AROM = -19 degrees   Status Achieved     PT SHORT TERM GOAL #4   Title Pt will improve L knee extension AROM from -12 degrees to -7 degrees to progress  toward more normalized gait pattern.    Baseline Met 6/5.   Status Achieved     PT SHORT TERM GOAL #5   Title Pt will consistently perform sit <> stand from standard chair with mod I, normal BOS, and symmetrical LE placement.   Baseline Met 6/5.   Status Achieved     PT SHORT TERM GOAL #6   Title Pt will negotiate 4 stairs with R rail with mod I using LRAD to indicate pt safety using primary home entrance.    Baseline Met 6/5.  Status Achieved     PT SHORT TERM GOAL #7   Title Pt will negotiate standard ramp and curb step with supervision using LRAD to progress toward independence with community obstacle negotiation.    Baseline Met 6/5.   Status Achieved           PT Long Term Goals - Mar 08, 2016 1555      PT LONG TERM GOAL #1   Title Pt will independently demo HEP to maximize functional gains made in PT.  (Target date: 02/05/16)   Baseline Met 6/27.   Status Achieved     PT LONG TERM GOAL #2   Title Pt will improve R knee extension AROM from -26 degrees to -15 degrees to promote more normalized gait pattern, increase energy/time efficiency of mobility.  (02/26/16)   Baseline 7/6: -15 degrees   Status Achieved     PT LONG TERM GOAL #3   Title Pt will improve L knee extension AROM from -12 degrees to -2 degrees to promote more normalized gait pattern, increase energy/time efficiency of mobility.  (02/26/16)   Baseline 7/6: -9 degrees   Status Partially Met     PT LONG TERM GOAL #4   Title Pt will improve gait velocity from 0.86 ft/sec to >/= 1.29 ft/sec to indicate significantly increased efficiency of ambulation. (02/26/16)   Baseline 1.28 ft/sec   Status Partially Met     PT LONG TERM GOAL #5   Title Pt will improve TUG time from 38.57 seconds to </= 28.17 seconds to indicate significant improvement in efficiency of mobility. (02/26/16)   Baseline 7/6: TUG = 16.83 seconds without AD   Status Achieved     PT LONG TERM GOAL #6   Title Pt will negotiate standard ramp and  curb step with mod I using LRAD to indicate increased stability/independence with community obstacle negotiation. (02/26/16)   Baseline Met 03/09/2023.   Status Achieved     PT LONG TERM GOAL #7   Title Pt will ambulate >300' over level and unlevel indoor and (paved) outdoor surfaces with mod I using LRAD to indicate increased safety with community mobility.    Baseline Met 6/27.   Status Achieved     PT LONG TERM GOAL #8   Title Positional testing will be negative to indicate resovled BPPV.  (02/26/16)   Baseline Met 7/6.   Status Achieved     PT LONG TERM GOAL  #9   TITLE Berg score will improve from 40/56 to >/= 45/56 to indicate decreased fall risk.  (02/26/16)   Baseline 2023-03-09: Berg score = 50/56   Status Achieved               Plan - 2016-03-08 1644    Clinical Impression Statement Patient has met (or partially met) all LTG's. BPPV clear, Berg score indicates low fall risk, and pt reports no further goals (in reference to functional mobility) at this time. Pt will therefore be discharged from outpatient PT.    Consulted and Agree with Plan of Care Patient      Patient will benefit from skilled therapeutic intervention in order to improve the following deficits and impairments:     Visit Diagnosis: Other abnormalities of gait and mobility  Unsteadiness on feet       G-Codes - 03-08-2016 1636    Functional Assessment Tool Used TUG: 16.83 without AD, gait velocity = 1.28 ft/sec   Functional Limitation Mobility: Walking and moving around   Mobility: Walking and Moving  Around Goal Status [phone]) At least 40 percent but less than 60 percent impaired, limited or restricted   Mobility: Walking and Moving Around Discharge Status [phone]) At least 20 percent but less than 40 percent impaired, limited or restricted      Problem List Patient Active Problem List   Diagnosis Date Noted  . 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 Tobacco abuse 08/16/2015  . Peripheral neuropathy  ([place]) 07/17/2015  . Cervical cord compression with myelopathy ([place]) 02/08/2015  . Frozen shoulder--left 09/05/2014  . Unintentional weight loss 06/20/2014  . Health care maintenance 03/23/2012  . Hypertension associated with diabetes ([place]) 08/17/2006  . GERD 08/17/2006  . History of diabetes mellitus, type II 08/11/2006  . Hyperlipidemia associated with type 2 diabetes mellitus ([place]) 08/11/2006  . Osteoarthritis 08/11/2006    PHYSICAL THERAPY DISCHARGE SUMMARY  Visits from Start of Care: 17  Current functional level related to goals / functional outcomes: See above.   Remaining deficits: See above; pt continues to exhibit gait impairments associated with spastic tetraplegia.   Education / Equipment: HEP for functional strengthening, habituation, standing balance, stretching (for spasticity management); B adjustable-tension PRAFO's Plan: Patient agrees to discharge.  Patient goals were met. Patient is being discharged due to meeting the stated rehab goals.  ?????             Billie Ruddy, PT, [facility] [address] [place], [place], [address] Phone: [phone]   Fax:  [phone] 02/28/16, 4:45 PM  Name: Julie Elliott MRN: [id number] Date of Birth: [birth year]

## 2016-03-25 ENCOUNTER — Encounter: Payer: Medicare Other | Attending: Physical Medicine & Rehabilitation

## 2016-03-25 ENCOUNTER — Ambulatory Visit (HOSPITAL_BASED_OUTPATIENT_CLINIC_OR_DEPARTMENT_OTHER): Payer: Medicare Other | Admitting: Physical Medicine & Rehabilitation

## 2016-03-25 ENCOUNTER — Encounter: Payer: Self-pay | Admitting: Physical Medicine & Rehabilitation

## 2016-03-25 VITALS — BP 123/82 | HR 88 | Resp 17

## 2016-03-25 DIAGNOSIS — G8250 Quadriplegia, unspecified: Secondary | ICD-10-CM | POA: Diagnosis not present

## 2016-03-25 DIAGNOSIS — I10 Essential (primary) hypertension: Secondary | ICD-10-CM | POA: Insufficient documentation

## 2016-03-25 DIAGNOSIS — F17210 Nicotine dependence, cigarettes, uncomplicated: Secondary | ICD-10-CM | POA: Diagnosis not present

## 2016-03-25 DIAGNOSIS — Z9889 Other specified postprocedural states: Secondary | ICD-10-CM | POA: Insufficient documentation

## 2016-03-25 DIAGNOSIS — E119 Type 2 diabetes mellitus without complications: Secondary | ICD-10-CM | POA: Diagnosis not present

## 2016-03-25 DIAGNOSIS — I509 Heart failure, unspecified: Secondary | ICD-10-CM | POA: Insufficient documentation

## 2016-03-25 DIAGNOSIS — R51 Headache: Secondary | ICD-10-CM | POA: Diagnosis not present

## 2016-03-25 DIAGNOSIS — E785 Hyperlipidemia, unspecified: Secondary | ICD-10-CM | POA: Insufficient documentation

## 2016-03-25 DIAGNOSIS — M1990 Unspecified osteoarthritis, unspecified site: Secondary | ICD-10-CM | POA: Diagnosis not present

## 2016-03-25 DIAGNOSIS — K219 Gastro-esophageal reflux disease without esophagitis: Secondary | ICD-10-CM | POA: Diagnosis not present

## 2016-03-25 NOTE — Progress Notes (Signed)
Subjective:  Performed Botox injection on 02/12/2016 injecting FDS, left side FDP, FCR 50 units each, FCU 25 units, FCR 50 units, PT 25 units and pectoralis 200 units  Patient ID: Julie Elliott, female    DOB: Jun 16, 1956, 60 y.o.   MRN: [id number]  HPI Patient had no post injection complications She feels like she can move her left fingers better now. She also feels looser at the shoulder. Pain Inventory Average Pain 7 Pain Right Now 5 My pain is sharp, burning, dull and tingling  In the last 24 hours, has pain interfered with the following? General activity 4 Relation with others 4 Enjoyment of life 4 What TIME of day is your pain at its worst? evening Sleep (in general) Good  Pain is worse with: some activites Pain improves with: rest Relief from Meds: good  Mobility walk with assistance use a cane how many minutes can you walk? 30 ability to climb steps?  yes do you drive?  no Do you have any goals in this area?  yes  Function disabled: date disabled NA I need assistance with the following:  dressing, bathing, meal prep, household duties and shopping  Neuro/Psych numbness tingling  Prior Studies Any changes since last visit?  no  Physicians involved in your care Any changes since last visit?  no   Family History  Problem Relation Age of Onset  . Diabetes Mother   . Heart disease Mother   . Prostate cancer Father   . Neuropathy Neg Hx    Social History   Social History  . Marital status: Single    Spouse name: N/A  . Number of children: 3  . Years of education: 12   Occupational History  . Unemployed    Social History Main Topics  . Smoking status: Current Some Day Smoker    Packs/day: 0.30    Types: Cigarettes    Last attempt to quit: 04/18/2013  . Smokeless tobacco: Current User  . Alcohol use No  . Drug use: No  . Sexual activity: Not Asked   Other Topics Concern  . None   Social History Narrative   to get supplies nfrom [employer]  per pt request.1--[phone]   Lives at home with son.    Right handed.   Caffeine use: drinks coffee-rare   Past Surgical History:  Procedure Laterality Date  . carpel tunnel Right    20 yrs ago  . CERVICAL SPINE SURGERY  2016  . TUBAL LIGATION     Past Medical History:  Diagnosis Date  . CHF (congestive heart failure) ([place])   . Chronic headaches   . DJD (degenerative joint disease)   . DM (diabetes mellitus) ([place]) 2008   stable HgBA1C at 6.5  . GERD (gastroesophageal reflux disease)    well controlled on Omeprazole  . HLD (hyperlipidemia) 2008   stable, well controlled  . Hypertension    well controlled   BP 123/82   Pulse 88   Resp 17   SpO2 96%   Opioid Risk Score:   Fall Risk Score:  `1  Depression screen PHQ 2/9  Depression screen [facility] 2/9 11/06/2015 10/19/2015 08/14/2015 02/27/2015 10/17/2014 09/12/2014 06/20/2014  Decreased Interest 0 1 0 0 0 0 0  Down, Depressed, Hopeless 0 0 0 0 0 0 0  PHQ - 2 Score 0 1 0 0 0 0 0  Altered sleeping - 0 - - - - -  Tired, decreased energy - 0 - - - - -  Change in appetite - 0 - - - - -  Feeling bad or failure about yourself  - 0 - - - - -  Trouble concentrating - 0 - - - - -  Moving slowly or fidgety/restless - 0 - - - - -  Suicidal thoughts - 0 - - - - -  PHQ-9 Score - 1 - - - - -  Difficult doing work/chores - Not difficult at all - - - - -    Review of Systems  Neurological: Positive for numbness.       Tingling   All other systems reviewed and are negative.      Objective:   Physical Exam  Patient has extensor contractures of the fingers of the left hand at the PIP, MCP, and DIP. She has partial flexion of fingers once again limited by contracture. [place] is 0 at the finger flexors, 0 at the pronator. Ashworth 3 at the pectoralis/internal rotators Ashworth 1 at the wrist flexors She has trace wrist flexion, extension to minus bicep and deltoid. 3 minus on the right      Assessment & Plan:  1. 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  She's had a good response to the Botox injection. I would recommend same dosing same muscle group. Injection in 6 weeks  FDS 50 FDP 50 FCU 25 FCR 50 PT 25 Pect 200

## 2016-03-25 NOTE — Patient Instructions (Signed)
Will do same dosage as well as same muscle groups as the previous Botox injection.

## 2016-03-26 ENCOUNTER — Other Ambulatory Visit: Payer: Self-pay | Admitting: Neurology

## 2016-03-26 ENCOUNTER — Encounter: Payer: Self-pay | Admitting: *Deleted

## 2016-03-26 DIAGNOSIS — M4712 Other spondylosis with myelopathy, cervical region: Secondary | ICD-10-CM

## 2016-03-26 NOTE — Progress Notes (Signed)
Faxed printed rx lyrica to pt pharmacy. Called and spoke to pharmacy that stated her old one old allowed 5 refills per law. Fax: [phone]. Received confirmation.

## 2016-04-08 ENCOUNTER — Other Ambulatory Visit: Payer: Self-pay | Admitting: Physical Medicine & Rehabilitation

## 2016-04-29 IMAGING — PT NM PET TUM IMG INITIAL (PI) SKULL BASE T - THIGH
7 series · 25 of 25 positions shown · non-contrast
Comparison: Cervical MRI 03/13/2015

CLINICAL DATA: Initial treatment strategy for cervical lesion.
Concern for malignancy. Weight loss..

EXAM:
NUCLEAR MEDICINE PET SKULL BASE TO THIGH
TECHNIQUE: 8.5 mCi F-18 FDG was injected intravenously. Full-ring PET imaging
was performed from the skull base to thigh after the radiotracer. CT
data was obtained and used for attenuation correction and anatomic
localization.
FASTING BLOOD GLUCOSE:  Value: 88 mg/dl

[Series 3: pet hn_sk_thigh ac · axial · 5.0mm · 4.07mm/px · z∈[-894,-86]mm · 5 of 203 slices shown]
[im 1/203]
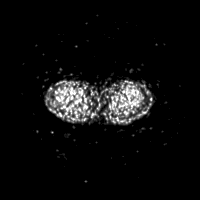
[im 51/203]
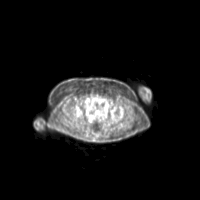
[im 102/203]
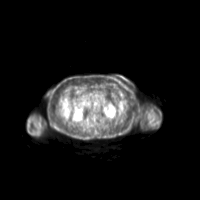
[im 152/203]
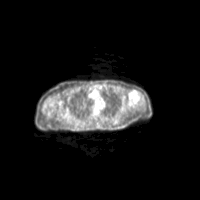
[im 203/203]
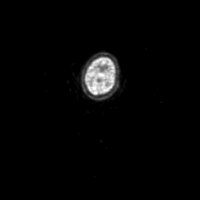

[Series 4: ct hn_sk_th 5.0 hd_fov · axial · 5.0mm · 1.09mm/px · z∈[-894,-86]mm · 5 of 203 slices shown]
[im 1/203]
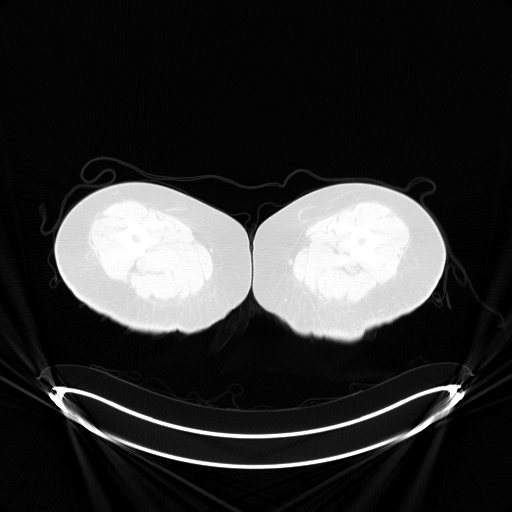
[im 51/203]
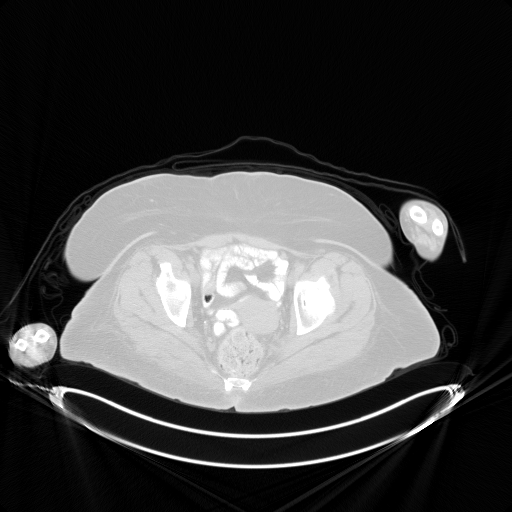
[im 102/203]
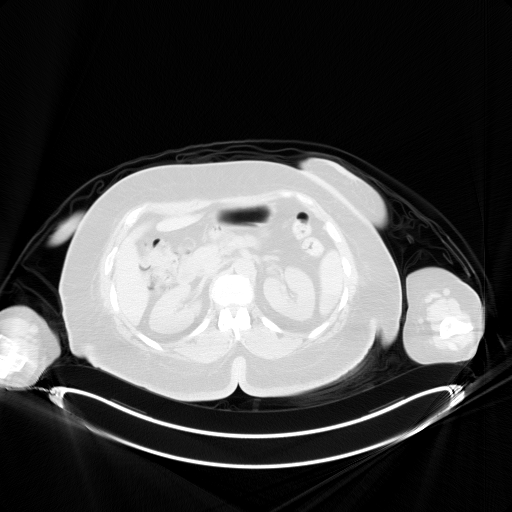
[im 152/203]
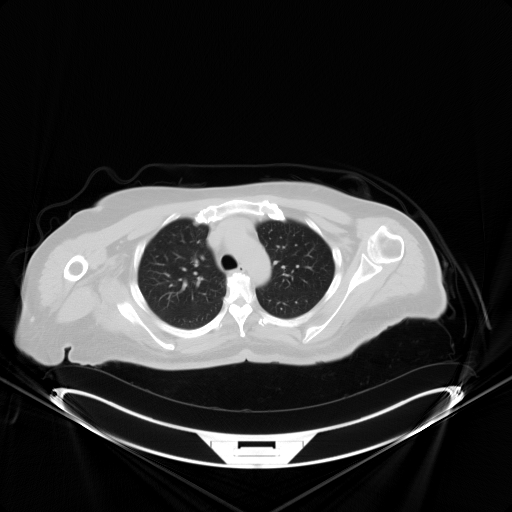
[im 203/203]
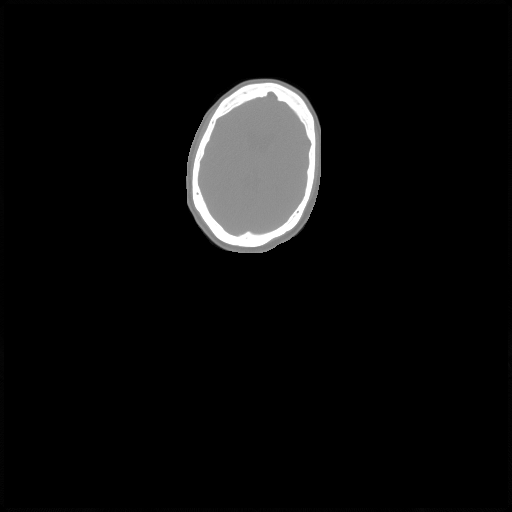

[Series 6: ct hn_sk_th 5.0 b70f (id)_bone · axial · 5.0mm · 0.56mm/px · z∈[-458,-226]mm · 2 of 59 slices shown]
[im 1/59  bone]
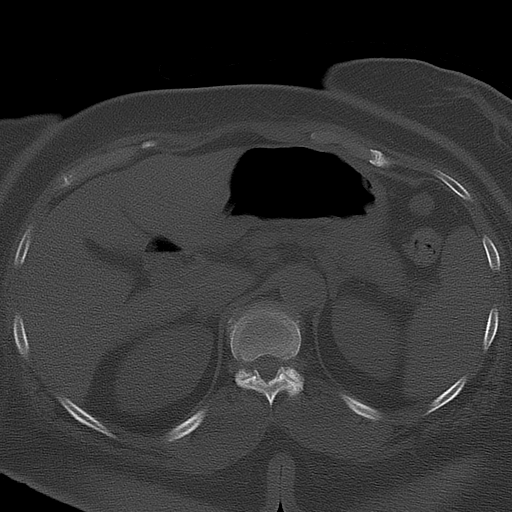
[im 59/59  bone]
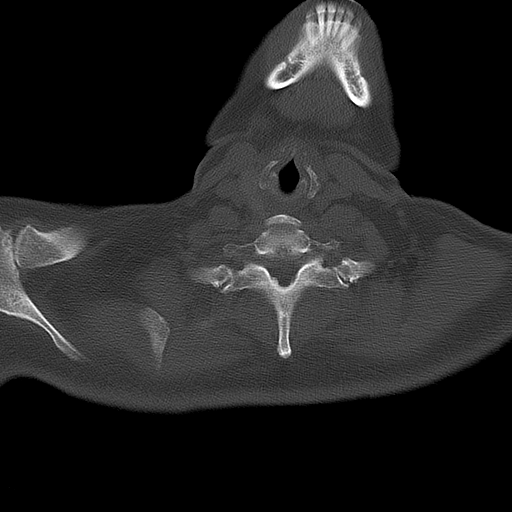

[Series 8: pet hn_sk_thigh nac · axial · 5.0mm · 4.07mm/px · z∈[-894,-86]mm · 5 of 203 slices shown]
[im 1/203]
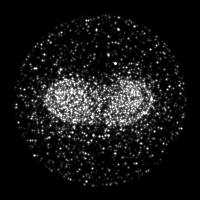
[im 51/203]
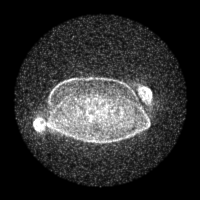
[im 102/203]
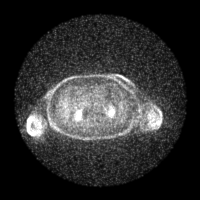
[im 152/203]
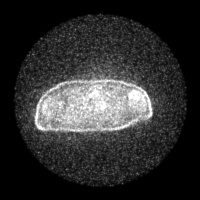
[im 203/203]
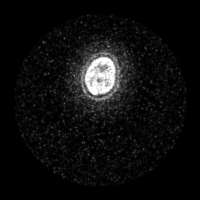

[Series 604: mip collection<mip range> · coronal · 1.68mm/px · 1 of 32 slices shown]
[im 1/32]
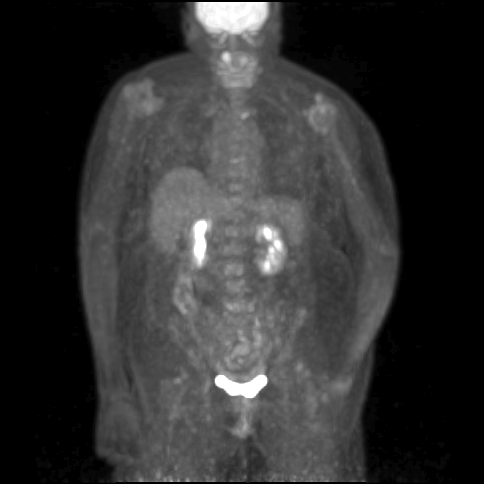

[Series 605: range-ct hn_sk_th 5.0 hd_fov-cor-<alpha range> · 2 of 70 slices shown]
[im 1/70]
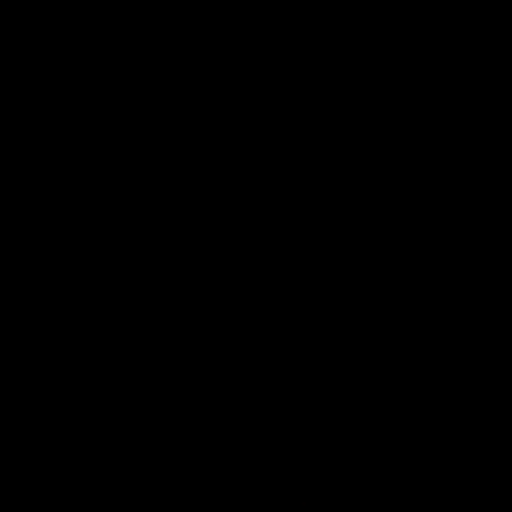
[im 70/70]
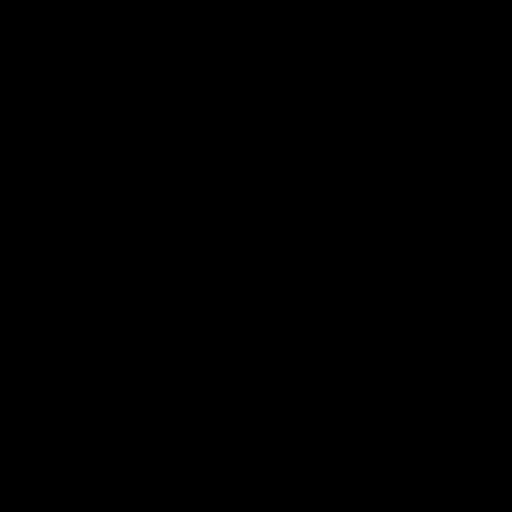

[Series 606: range-ct hn_sk_th 5.0 hd_fov-tra-<alpha range> · 5 of 195 slices shown]
[im 1/195]
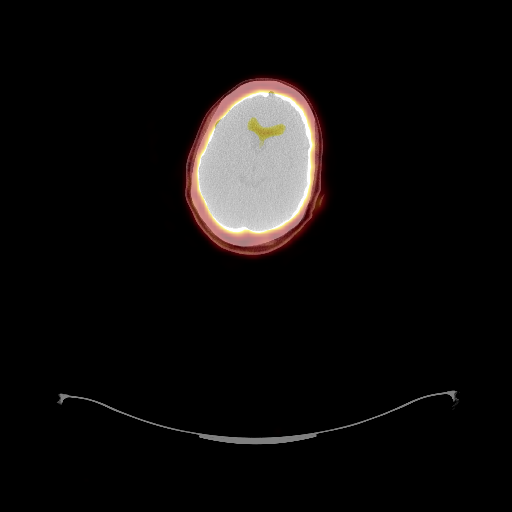
[im 49/195]
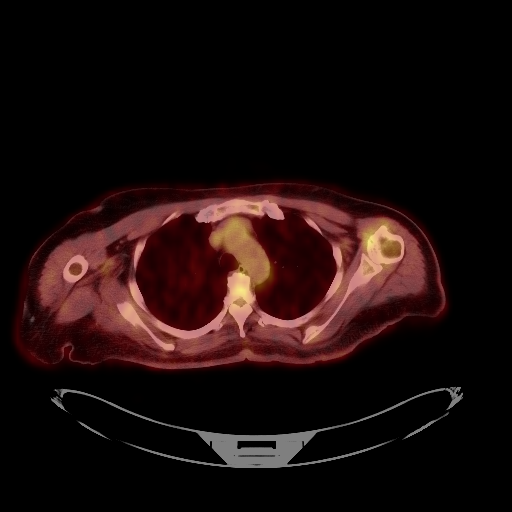
[im 98/195]
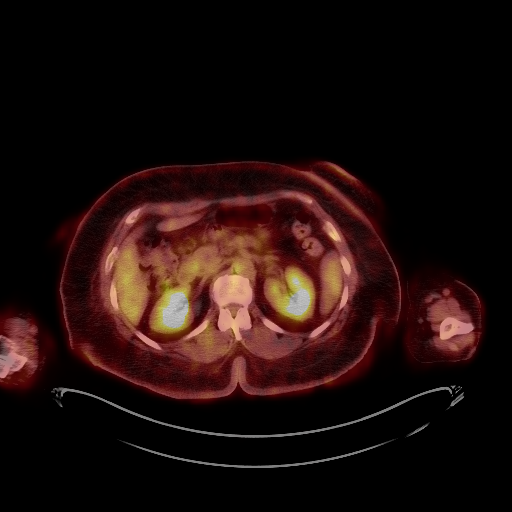
[im 146/195]
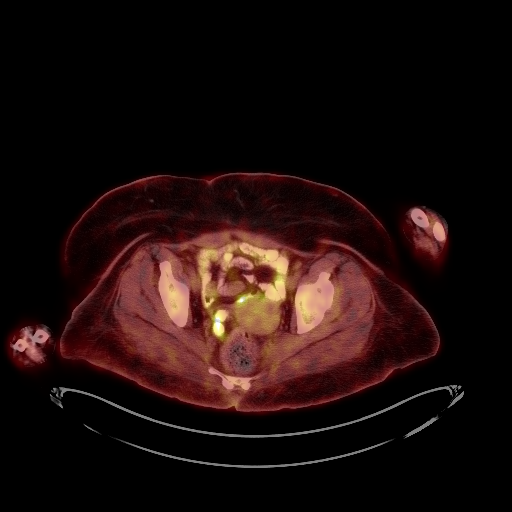
[im 195/195]
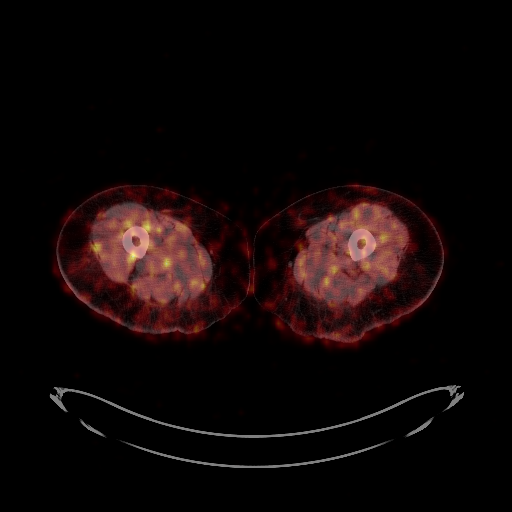

[25 of 25 positions shown; findings below may reference images not displayed]

FINDINGS: NECK

There is increased activity within the spinal cord from the
skullbase through C7. There is focal activity at C5-C6 which
corresponds to the lesion on comparison MRI. No hypermetabolic
cervical lymphadenopathy. Scroll

There is asymmetric metabolic activity within the RIGHT vallecula
compared to the LEFT (image 24 of the fused data set). This activity
is intense with SUV max equal 11.6 compared to prior wall 7.0 on the
contralateral side. No discrete mass lesions identified. T

Here is a focus of activity at the LEFT small first rib and clavicle
junction (image 48 of fused data set) without associated finding on
the CT portion.

CHEST

No hypermetabolic mediastinal or hilar nodes. 4 mm on palm lung
nodule along the RIGHT oblique fissure on image (image 25 with,
series 6).

ABDOMEN/PELVIS

No abnormal hypermetabolic activity within the liver, pancreas,
adrenal glands, or spleen. No hypermetabolic lymph nodes in the
abdomen or pelvis. Nonobstructing calculus in the LEFT kidney
measures 7 mm. Uterus and ovaries are normal.

SKELETON

No focal hypermetabolic activity to suggest skeletal metastasis.
IMPRESSION: 1. Hypermetabolic focus within the spinal cord corresponds to
enhancing lesion on comparison cervical MRI. Differential includes
inflammatory process, infectious process, or neoplastic process.).
2. Asymmetric metabolic activity in the RIGHT vallecula region of
the hypopharynx para the RIGHT. Consider direct visualization if
concern for head and neck malignancy.
3. Asymmetric uptake at the junction of the first rib LEFT clavicle
is likely degenerative as there is no lesion identified on
comparison CT
4. Small nodule along the RIGHT oblique fissure is likely benign. If
the patient is at high risk for bronchogenic carcinoma, follow-up
chest CT at 1 year is recommended. If the patient is at low risk, no
follow-up is needed. This recommendation follows the consensus
statement: Guidelines for Management of Small Pulmonary Nodules
Detected on CT Scans: A Statement from the [HOSPITAL] as

## 2016-05-13 ENCOUNTER — Ambulatory Visit (INDEPENDENT_AMBULATORY_CARE_PROVIDER_SITE_OTHER): Payer: Medicare Other | Admitting: Internal Medicine

## 2016-05-13 ENCOUNTER — Encounter: Payer: Medicare Other | Admitting: Internal Medicine

## 2016-05-13 ENCOUNTER — Encounter: Payer: Self-pay | Admitting: Internal Medicine

## 2016-05-13 DIAGNOSIS — G8250 Quadriplegia, unspecified: Secondary | ICD-10-CM

## 2016-05-13 DIAGNOSIS — G629 Polyneuropathy, unspecified: Secondary | ICD-10-CM

## 2016-05-13 DIAGNOSIS — F17210 Nicotine dependence, cigarettes, uncomplicated: Secondary | ICD-10-CM

## 2016-05-13 DIAGNOSIS — Z720 Tobacco use: Secondary | ICD-10-CM

## 2016-05-13 DIAGNOSIS — G609 Hereditary and idiopathic neuropathy, unspecified: Secondary | ICD-10-CM

## 2016-05-13 DIAGNOSIS — Z0000 Encounter for general adult medical examination without abnormal findings: Secondary | ICD-10-CM

## 2016-05-13 DIAGNOSIS — M4712 Other spondylosis with myelopathy, cervical region: Secondary | ICD-10-CM

## 2016-05-13 DIAGNOSIS — Z79899 Other long term (current) drug therapy: Secondary | ICD-10-CM

## 2016-05-13 MED ORDER — PREGABALIN 75 MG PO CAPS: ORAL_CAPSULE | ORAL | 2 refills | Status: DC

## 2016-05-13 NOTE — Progress Notes (Signed)
   CC: Spasms in arms and legs   HPI:  [name] is a 60 y.o. woman with PMHx of cervical myelopathy s/p ACDF, peripheral neuropathy, and tobacco abuse who presents today for follow up of her spastic quadriparesis.   Spastic Quadriparesis: She reports her spasms are occurring about 4-5 times per day and cause her discomfort. She states the botox injections she has been receiving at Dr. Michaell Cowing are helping and she is due for another session soon. She reports the Zanaflex does not help her symptoms at all and she is only taking this once daily. She does note the Lyrica helps her symptoms significantly but she waits to take both pills at bedtime so she can sleep through the night.   Peripheral Neuropathy: She reports burning sensations in her hands and lower legs. She notes the burning sensation in her legs is more intense and worse when the spasms occur. She states the Lyrica does help relieve this discomfort.   Tobacco Abuse: She reports she is still smoking 2-3 cigarettes per day. She is smoking because this helps her deal with the daily spasms. She states if her spasms were under better control she could quit completely.   Past Medical History:  Diagnosis Date  . CHF (congestive heart failure) ([place])   . Chronic headaches   . DJD (degenerative joint disease)   . DM (diabetes mellitus) ([place]) 2008   stable HgBA1C at 6.5  . GERD (gastroesophageal reflux disease)    well controlled on Omeprazole  . HLD (hyperlipidemia) 2008   stable, well controlled  . Hypertension    well controlled    Review of Systems:  All systems reviewed and negative except per HPI  Physical Exam:  Vitals:   05/13/16 1342  BP: 138/80  Pulse: 94  Temp: 99.2 F (37.3 C)  TempSrc: Oral  SpO2: 100%  Weight: 186 lb 1.6 oz (84.4 kg)  Height: 5\' 3"  (1.6 m)   General: well-nourished woman sitting up, pleasant, NAD HEENT: [place]/AT, EOMI, sclera anicteric, mucus membranes moist CV: RRR, no m/g/r Pulm: CTA  bilaterally, breaths non-labored Abd: BS+, soft, non-tender Ext: moves all extremities but has limited ROM in the left upper extremity. No peripheral edema. Neuro: alert and oriented x 3  Assessment & Plan:   See Encounters Tab for problem based charting.  Patient discussed with Dr. Dareen Piano

## 2016-05-13 NOTE — Patient Instructions (Signed)
General Instructions: - Change Lyrica to 75 mg in morning, 75 mg in evening and 150 mg at bedtime - Please make an eye appointment at [facility] - Work on quitting smoking completely - Follow up in 6 weeks to see how Lyrica increase is working   Please bring your medicines with you each time you come to clinic.  Medicines may include prescription medications, over-the-counter medications, herbal remedies, eye drops, vitamins, or other pills.   Progress Toward Treatment Goals:  Treatment Goal 02/27/2015  Hemoglobin A1C at goal  Blood pressure at goal  Stop smoking -    Self Care Goals & Plans:  Self Care Goal 05/13/2016  Manage my medications take my medicines as prescribed; bring my medications to every visit; refill my medications on time  Monitor my health -  Eat healthy foods eat foods that are low in salt; eat more vegetables; eat baked foods instead of fried foods  Be physically active find an activity I enjoy  Stop smoking set a quit date and stop smoking    Home Blood Glucose Monitoring 06/20/2014  Check my blood sugar once a day  When to check my blood sugar -     Care Management & Community Referrals:  Referral 10/17/2014  Referrals made for care management support nutritionist

## 2016-05-14 NOTE — Assessment & Plan Note (Signed)
Will increase her Lyrica to 75 mg in the morning, 75 mg in the evening, and 150 mg at night. Will re-evaluate in a few weeks if this is improving her symptoms.

## 2016-05-14 NOTE — Assessment & Plan Note (Signed)
Her spasms are occurring frequently throughout the day and causing significant discomfort and interference with her quality of life. She reports Lyrica does help her symptoms but she is only taking this at night. Since the Zanaflex does not help alleviate her symptoms will stop this completely. Will add additional Lyrica for the daytime to better control her spasms. Will increase Lyrica to 75 mg in the morning, 75 mg in the evening and continue 150 mg at night. She will let me know in the next few weeks if this increase is improving her symptoms.

## 2016-05-14 NOTE — Progress Notes (Signed)
Internal Medicine Clinic Attending  Case discussed with Dr. Rivet soon after the resident saw the patient.  We reviewed the resident's history and exam and pertinent patient test results.  I agree with the assessment, diagnosis, and plan of care documented in the resident's note.  

## 2016-05-14 NOTE — Assessment & Plan Note (Signed)
She is still smoking a minimal amount. She has agreed if we get her spasms under better control that she will quit. Hopefully the increase in Lyrica will alleviate her symptoms.

## 2016-05-14 NOTE — Assessment & Plan Note (Signed)
-   Declined flu shot - Advised to get eye exam- patient will schedule with [facility]

## 2016-05-20 ENCOUNTER — Encounter: Payer: Medicare Other | Admitting: Internal Medicine

## 2016-05-23 ENCOUNTER — Ambulatory Visit (HOSPITAL_BASED_OUTPATIENT_CLINIC_OR_DEPARTMENT_OTHER): Payer: Medicare Other | Admitting: Physical Medicine & Rehabilitation

## 2016-05-23 ENCOUNTER — Encounter: Payer: Medicare Other | Attending: Physical Medicine & Rehabilitation

## 2016-05-23 ENCOUNTER — Encounter: Payer: Self-pay | Admitting: Physical Medicine & Rehabilitation

## 2016-05-23 DIAGNOSIS — G8114 Spastic hemiplegia affecting left nondominant side: Secondary | ICD-10-CM | POA: Diagnosis not present

## 2016-05-23 DIAGNOSIS — I10 Essential (primary) hypertension: Secondary | ICD-10-CM | POA: Diagnosis not present

## 2016-05-23 DIAGNOSIS — R51 Headache: Secondary | ICD-10-CM | POA: Insufficient documentation

## 2016-05-23 DIAGNOSIS — I509 Heart failure, unspecified: Secondary | ICD-10-CM | POA: Diagnosis not present

## 2016-05-23 DIAGNOSIS — Z9889 Other specified postprocedural states: Secondary | ICD-10-CM | POA: Insufficient documentation

## 2016-05-23 DIAGNOSIS — M1990 Unspecified osteoarthritis, unspecified site: Secondary | ICD-10-CM | POA: Diagnosis not present

## 2016-05-23 DIAGNOSIS — G8110 Spastic hemiplegia affecting unspecified side: Secondary | ICD-10-CM

## 2016-05-23 DIAGNOSIS — K219 Gastro-esophageal reflux disease without esophagitis: Secondary | ICD-10-CM | POA: Diagnosis not present

## 2016-05-23 DIAGNOSIS — E119 Type 2 diabetes mellitus without complications: Secondary | ICD-10-CM | POA: Insufficient documentation

## 2016-05-23 DIAGNOSIS — F17210 Nicotine dependence, cigarettes, uncomplicated: Secondary | ICD-10-CM | POA: Diagnosis not present

## 2016-05-23 DIAGNOSIS — G8250 Quadriplegia, unspecified: Secondary | ICD-10-CM | POA: Diagnosis not present

## 2016-05-23 DIAGNOSIS — E785 Hyperlipidemia, unspecified: Secondary | ICD-10-CM | POA: Diagnosis not present

## 2016-05-23 NOTE — Patient Instructions (Signed)

## 2016-05-23 NOTE — Progress Notes (Signed)
   Botox Injection for spasticity using needle EMG guidance  Dilution: 50 Units/ml Indication: Severe spasticity which interferes with ADL,mobility and/or  hygiene and is unresponsive to medication management and other conservative care Informed consent was obtained after describing risks and benefits of the procedure with the patient. This includes bleeding, bruising, infection, excessive weakness, or medication side effects. A REMS form is on file and signed. Needle: 27g 1" needle electrode Number of units per muscle FDS 50 FDP 50 FCU 25 FCR 50 PT 25 Pect 200 All injections were done after obtaining appropriate EMG activity and after negative drawback for blood. The patient tolerated the procedure well. Post procedure instructions were given. A followup appointment was made.

## 2016-05-30 ENCOUNTER — Other Ambulatory Visit: Payer: Self-pay | Admitting: Internal Medicine

## 2016-06-19 ENCOUNTER — Inpatient Hospital Stay: Admit: 2016-06-19 | Payer: PRIVATE HEALTH INSURANCE | Primary: Registered Nurse

## 2016-06-19 ENCOUNTER — Encounter

## 2016-06-19 DIAGNOSIS — R05 Cough: Secondary | ICD-10-CM

## 2016-06-23 ENCOUNTER — Telehealth: Payer: Self-pay | Admitting: Internal Medicine

## 2016-06-23 NOTE — Telephone Encounter (Signed)
Called to see how Lyrica was working for patient. She states it is helping with her spasms. She is still getting spasms in her hand but otherwise is improved.

## 2016-07-01 ENCOUNTER — Inpatient Hospital Stay: Admit: 2016-07-01 | Primary: Registered Nurse

## 2016-07-01 LAB — SENTARA SPECIMEN COLLN.

## 2016-07-04 ENCOUNTER — Ambulatory Visit: Payer: Medicare Other | Admitting: Physical Medicine & Rehabilitation

## 2016-07-08 ENCOUNTER — Encounter: Payer: Medicare Other | Attending: Physical Medicine & Rehabilitation

## 2016-07-08 ENCOUNTER — Ambulatory Visit (HOSPITAL_BASED_OUTPATIENT_CLINIC_OR_DEPARTMENT_OTHER): Payer: Medicare Other | Admitting: Physical Medicine & Rehabilitation

## 2016-07-08 ENCOUNTER — Encounter: Payer: Self-pay | Admitting: Physical Medicine & Rehabilitation

## 2016-07-08 VITALS — BP 132/85 | HR 99 | Resp 14

## 2016-07-08 DIAGNOSIS — G8250 Quadriplegia, unspecified: Secondary | ICD-10-CM

## 2016-07-08 DIAGNOSIS — E785 Hyperlipidemia, unspecified: Secondary | ICD-10-CM | POA: Diagnosis not present

## 2016-07-08 DIAGNOSIS — R51 Headache: Secondary | ICD-10-CM | POA: Diagnosis not present

## 2016-07-08 DIAGNOSIS — M1990 Unspecified osteoarthritis, unspecified site: Secondary | ICD-10-CM | POA: Insufficient documentation

## 2016-07-08 DIAGNOSIS — I10 Essential (primary) hypertension: Secondary | ICD-10-CM | POA: Insufficient documentation

## 2016-07-08 DIAGNOSIS — I509 Heart failure, unspecified: Secondary | ICD-10-CM | POA: Diagnosis not present

## 2016-07-08 DIAGNOSIS — K219 Gastro-esophageal reflux disease without esophagitis: Secondary | ICD-10-CM | POA: Insufficient documentation

## 2016-07-08 DIAGNOSIS — E119 Type 2 diabetes mellitus without complications: Secondary | ICD-10-CM | POA: Diagnosis not present

## 2016-07-08 DIAGNOSIS — Z9889 Other specified postprocedural states: Secondary | ICD-10-CM | POA: Diagnosis not present

## 2016-07-08 DIAGNOSIS — F17210 Nicotine dependence, cigarettes, uncomplicated: Secondary | ICD-10-CM | POA: Diagnosis not present

## 2016-07-08 NOTE — Progress Notes (Signed)
Subjective:    Patient ID: Julie Elliott, female    DOB: 1955/11/01, 60 y.o.   MRN: [id number]  HPI Returns today for follow-up of botulinum toxin injection performed 05/23/2016 Patient reports a loosening of her left shoulder. Some improvement in her hand tightness as well. Muscles injected include FDS 50 FDP 50 FCU 25 FCR 50 PT 25 Pect 200  We discussed physical therapy recommendations for Botox to the left lower extremity. We discussed that she is at a maximum dose of Botox. However, Dysport may be an option given the expanded dosing guidelines that are FDA approved.  Pain Inventory Average Pain 6 Pain Right Now 5 My pain is burning and tingling  In the last 24 hours, has pain interfered with the following? General activity 4 Relation with others 2 Enjoyment of life 4 What TIME of day is your pain at its worst? all Sleep (in general) Good  Pain is worse with: inactivity and standing Pain improves with: medication Relief from Meds: 5  Mobility walk with assistance use a cane how many minutes can you walk? 10 Do you have any goals in this area?  yes  Function not employed: date last employed . I need assistance with the following:  dressing, bathing, meal prep, household duties and shopping  Neuro/Psych weakness numbness trouble walking spasms  Prior Studies Any changes since last visit?  no  Physicians involved in your care Any changes since last visit?  no   Family History  Problem Relation Age of Onset  . Diabetes Mother   . Heart disease Mother   . Prostate cancer Father   . Neuropathy Neg Hx    Social History   Social History  . Marital status: Single    Spouse name: N/A  . Number of children: 3  . Years of education: 12   Occupational History  . Unemployed    Social History Main Topics  . Smoking status: Current Some Day Smoker    Packs/day: 0.30    Types: Cigarettes    Last attempt to quit: 04/18/2013  . Smokeless tobacco: Current  User  . Alcohol use No  . Drug use: No  . Sexual activity: Not Asked   Other Topics Concern  . None   Social History Narrative   to get supplies nfrom [facility] [employer] per pt request.1--[phone]   Lives at home with son.    Right handed.   Caffeine use: drinks coffee-rare   Past Surgical History:  Procedure Laterality Date  . carpel tunnel Right    20 yrs ago  . CERVICAL SPINE SURGERY  2016  . TUBAL LIGATION     Past Medical History:  Diagnosis Date  . CHF (congestive heart failure) ([place])   . Chronic headaches   . DJD (degenerative joint disease)   . DM (diabetes mellitus) ([place]) 2008   stable HgBA1C at 6.5  . GERD (gastroesophageal reflux disease)    well controlled on Omeprazole  . HLD (hyperlipidemia) 2008   stable, well controlled  . Hypertension    well controlled   BP 132/85 (BP Location: Right Arm, Patient Position: Sitting, Cuff Size: Large)   Pulse 99   Resp 14   SpO2 97%   Opioid Risk Score:   Fall Risk Score:  `1  Depression screen PHQ 2/9  Depression screen [facility] 2/9 05/13/2016 11/06/2015 10/19/2015 08/14/2015 02/27/2015 10/17/2014 09/12/2014  Decreased Interest 0 0 1 0 0 0 0  Down, Depressed, Hopeless 0 0 0 0 0 0  0  PHQ - 2 Score 0 0 1 0 0 0 0  Altered sleeping - - 0 - - - -  Tired, decreased energy - - 0 - - - -  Change in appetite - - 0 - - - -  Feeling bad or failure about yourself  - - 0 - - - -  Trouble concentrating - - 0 - - - -  Moving slowly or fidgety/restless - - 0 - - - -  Suicidal thoughts - - 0 - - - -  PHQ-9 Score - - 1 - - - -  Difficult doing work/chores - - Not difficult at all - - - -    Review of Systems  HENT: Negative.   Eyes: Negative.   Respiratory: Negative.   Cardiovascular: Negative.   Gastrointestinal: Negative.   Genitourinary: Negative.   Musculoskeletal: Positive for gait problem.       Spasms  Skin: Negative.   Allergic/Immunologic: Negative.   Neurological: Positive for weakness and numbness.       Tingling    Hematological: Negative.   All other systems reviewed and are negative.      Objective:   Physical Exam  Neurological:  Fingers are extended at the PIP and DIP approximate 30 flexion at MCP. Thumb is extended at IP joint. She has opposition Wrist is in a 20 extended position. No active wrist flexion and extension.  3 minus deltoid, 2 minus, elbow flexion, extension, 0 at the finger flexors, extensors, except for the thumb. 3 minus, hip flexor, 4 minus. Knee extensor, trace ankle plantar flexion, 0 ankle dorsiflexion. With ambulation, there is evidence of toe drag due to plantar flexor spasticity. She has clonus at the left ankle.       Assessment & Plan:  1. 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

## 2016-07-10 ENCOUNTER — Encounter

## 2016-07-15 ENCOUNTER — Inpatient Hospital Stay: Admit: 2016-07-15 | Payer: PRIVATE HEALTH INSURANCE | Attending: Family Medicine | Primary: Registered Nurse

## 2016-07-15 ENCOUNTER — Ambulatory Visit (INDEPENDENT_AMBULATORY_CARE_PROVIDER_SITE_OTHER): Payer: Medicare Other | Admitting: Neurology

## 2016-07-15 ENCOUNTER — Encounter: Payer: Self-pay | Admitting: Neurology

## 2016-07-15 ENCOUNTER — Ambulatory Visit

## 2016-07-15 DIAGNOSIS — M4712 Other spondylosis with myelopathy, cervical region: Secondary | ICD-10-CM | POA: Diagnosis not present

## 2016-07-15 DIAGNOSIS — Z1231 Encounter for screening mammogram for malignant neoplasm of breast: Secondary | ICD-10-CM

## 2016-07-15 NOTE — Progress Notes (Signed)
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   Interval history 07/15/2016: She is doing well. She continues to get botox in the left arm, switching to dysport soon. Spasms are improved. She has swelling in both legs.  She is walking well with a cane, she fell the other day she got up too fast as as she was walking she stumbled in the hallway and she fell on the floor. This is the first fall, she became off balance because she had to go to the bathroom and going too fast. No pain. She has swelling and numbness. No pain. She still takes lyrica. She does not need baclofen or benzodiazepines. She feels the botox has significantly helped. Son feels like she is better, she is moving faster, he doesn't have to helpe her in and out of the car as much. They got her a lift chair. They may start physical therapy after the next botox treatment. She would also benefit from occupational in addition to physical therapy for help with use of the left hand especially with cooking.   Interval history 01/10/2016: Patient feels like she has been doing excellent. She is walking much better, she loves physical therapy, she is getting better. Botox with Dr. Dianna Limbo is significantly helping with range of motion and pain in her left arm. She is working with Benjie Karvonen Hobble in physical therapy.  Using a cane but can walk without it. She is doing great with botox. Lyrica helps at night. Tizanidine helps in the morning takes it once a day with breakfast for spasticity but doesn't want to take any more than that due to side effects. She still has difficulty due to her weakness and spasticity with changing positions in bed, and this week she had an irritation on her back which was treated concerning for skin erosion. Discussed mattresses that can help prevent skin erosion and  ulcers. Will request a mattress to try and avoid bed sores. She has a irritation in the lower back now healing, worried about bed sores and will try and request a full size mattress to address this problem. Will talk to [facility]. Patient is here with her son as well. Her weight has stabilized. Son provides information. Had a long talk about what we can do in the future including continuing Botox, continued physical therapy, stretching and strengthening, other medications to help with spasticity and pain, and most important mattress to help address and prevent possible bed sores. Physical therapy ordered an orthosis for her left foot due to her abnormal gait because of foot inversion due to myelopathy, we'll discuss with Dr. Read Drivers and possibly he can help with Botox..   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 She is doing exceptionally well. The spasticity is improving and so it the pain. She still has balance problems but is up walking around with a cane, no falls. Have recommended baclofen for the spasticity but she is worried about taking it. I also recommend Rehab with Dr. Letta Pate and Naaman Plummer as well as botox for the left arm spasticity. The weight loss has stabilized. She needs podiatry for her toe nail problems. I am extremely pleased with her progress after surgery with  Dr. Rigoberto Noel at [facility], a remarkable improvement. She has some spasms in the left arm and both legs. The spasms are less with the Lyrica. The Baclofen didn't help.Her right leg has gotten weaker. She lives with her son who cares for her. Sleeping well at night. She has lost weight. She has spasms mostly in the left arm and mostly in the hand. She needs PT, there was an error in the referral to [place] and Naaman Plummer which has been corrected. Discussed spasticity  with patient. Have prescribed baclofen for patient but she is not interested in taking it, even though it can help with her spasticity. Discussed increasing baclofen and she declines, it makes her sleepy and difficult to walk. She is urinating fine, but bowel movements are difficult  Interval update 04/15/2015: Quinlan Mcfall is a 60 y.o. female here as a referral from Dr. Arcelia Jew for progressive gait ataxia, left hand pain and weakness. Ms. Hoffman is a 60yo woman with PMHx of HTN, systolic CHF, GERD, Type 2 DM, and hyperlipidemia who presents today for the following: Progressive spasticity, weakness, falls, painful paresthesias unintentional significant weight loss of over 100 pounds, decreased mobility. MRI of the cervical cord is significant for T2 hyperintense lesion extending from C4-C7 with focal spinal cord enhancement at C5-C6. She has gone through extensive workup and returns today to review, with son. 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 She is gone through extensive testing (see below) including a nuclear medicine PET scan whose findings were largely unremarkable except for asymmetric metabolic activity in the RIGHT vallecula region of the hypopharynx para the RIGHT (will send for direct visualization to rule out head and neck malignancy with Dr. Ernesto Rutherford), CT of the chest abdomen and pelvis were largely unremarkable and no malignancy was found. Extensive serum testing includes the following unremarkable labs: CMP, CBC, TSH, sedimentation rate, HIV with negative reflex, ANA with rheumatologic comprehensive panel, vitamin B1, heavy metals in the blood, pan-ANCA panel, multiple myeloma panel, vitamin B6, rheumatoid factor, paraneoplastic profile, high sensitivity CRP, hep C antibody, RPR,, B12 and folate, angiotensin-converting enzyme, hemoglobin A1c, CK, NMO, copper, ceruloplasmin, HTLV-1 and 2 antibodies, JCV,   CSF studies  were completed which include normal myelin basic protein, positive CSF oligoclonal pans greater than 5 that are not present in the patient's corresponding serum sample,negative Lyme antibodies, normal CNS IgG index, negative Echo virus antibodies, a high index of varus sellar zoster IgG in the CSF was found at 2518 (upper limit normal of 135) however these antibodies were also found in an higher quantity the serum and so this is not consistent with intrathecal production, negative Epstein-Barr PCR, negative VDRL, negative CMV PCR, negative HSV PCR, still pending VZV DNA PCR, fungus culture with smear in progress, cell count with differential unremarkable, normal glucose and elevated protein.   Repeat MRI cervical cord: FINDINGS: :  On sagittal images, the spine is imaged from above the cervicomedullary junction to T3T4. There is increased signal within the spinal cord from C4-C5 to C7 on T2-weighted and STIR images. There is focal enlargement of the spinal cord adjacent to C5C6 and C6. There is relatively flat enhancement within this region only adjacent to C5C6.   The vertebral bodies are normally aligned. The vertebral bodies have normal signal. The discs and interspaces were further evaluated on axial views from C2 to T1 as follows: C2 - C3: The disc and interspace appear normal. C3 - C4:  There is mild uncovertebral spurring and disc bulging causing moderate bilateral foraminal narrowing. There is no definite nerve root compression, but there is some encroachment of the exiting C4 nerve roots. C4 - C5: There is mild bilateral uncovertebral spurring and imaging causing severe bilateral foraminal narrowing. There could be compression of either of the C5 nerve roots. Posteriorly within the spinal cord, there is hyperintense signal on T2 images extending inferiorly.  C5 - C6: There is moderate spinal stenosis due to broad disc protrusion, uncovertebral spurring and facet  hypertrophy. There is severe left and very severe right foraminal narrowing leading to right C6 and possible left C6 nerve root compression. The entire spinal cord is hyperintense C6 - C7: There is broad disc protrusion and facet hypertrophy causing severe left and moderate right foraminal narrowing. There is possible left C7 nerve root compression. Abnormal signal is seen in the entire spinal cord at the interspace and medially adjacent to the C7 vertebral body.  C7 - T1: The disc and interspace appear normal. Hyperintense signal is noted medially and anteriorly only, within the spinal cord T1 - T2: The disc and interspace appear normal.   Compared to the MRI dated 03/13/2015, there is no definite change.   IMPRESSION: This is an abnormal MRI of the cervical spine with and without contrast showing: 1. Abnormal T2 hyperintense signal within the spinal cord from C4C5 to C7 with mild expansion of the cord adjacent to C5C6 to C6. There is "pancake-like" flat enhancement within the spinal cord only adjacent to C5C6. The findings are stable compared to 03/13/2015. 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 Inflammatory, demyelinating and neoplastic etiologies remain in the differential diagnosis 2. At [place], there is severe foraminal narrowing bilaterally that could lead to compression of either c5 nerve root. 3. At C5C6, there is moderate spinal stenosis and very severe right and severe left foraminal narrowing. There is probable right and possible left C6 nerve root compression.  4. At C6C7, there is severe left foraminal narrowing with possible left C7 nerve root compression  An extensive workup has been completed on patient without another etiology found except for possible compressive  spondylosis of the spine. Workup consisted of  Nuclear medicine PET image: FINDINGS: NECK There is increased activity within the spinal cord from the skullbase through C7. There is focal activity at C5-C6 which corresponds to the lesion on comparison MRI. No hypermetabolic cervical lymphadenopathy.   There is asymmetric metabolic activity within the RIGHT vallecula compared to the LEFT (image 24 of the fused data set). This activity is intense with SUV max equal 11.6 compared to prior wall 7.0 on the contralateral side. No discrete mass lesions identified.   Here is a focus of activity at the LEFT small first rib and clavicle junction (image 48 of fused data set) without associated finding on the CT portion.  CHEST  No hypermetabolic mediastinal or hilar nodes. 4 mm on palm lung nodule along the RIGHT oblique fissure on image (image 25 with, series 6).  ABDOMEN/PELVIS  No abnormal hypermetabolic activity within the liver, pancreas, adrenal glands, or spleen. No hypermetabolic lymph nodes in the abdomen or pelvis. Nonobstructing calculus in the LEFT kidney measures 7 mm. Uterus and ovaries are normal.  SKELETON  No focal hypermetabolic activity to suggest skeletal metastasis.  IMPRESSION: 1. Hypermetabolic focus within the spinal  cord corresponds to enhancing lesion on comparison cervical MRI. Differential includes inflammatory process, infectious process, or neoplastic process.). 2. Asymmetric metabolic activity in the RIGHT vallecula region of the hypopharynx para the RIGHT. Consider direct visualization if concern for head and neck malignancy. 3. Asymmetric uptake at the junction of the first rib LEFT clavicle is likely degenerative as there is no lesion identified on comparison CT 4. Small nodule along the RIGHT oblique fissure is likely benign. If the patient is at high risk for bronchogenic carcinoma, follow-up chest CT at 1 year is recommended. If the patient is at low  risk, no follow-up is needed. This recommendation follows the consensus statement: Guidelines for Management of Small Pulmonary Nodules Detected on CT Scans: A Statement from the [place] as published in Radiology 2005; 237:395-400.  CT ABDOMEN PELVIS FINDINGS  Hepatobiliary: Cholecystectomy.  Pancreas: Unremarkable  Spleen: Unremarkable  Adrenals/Urinary Tract: 6 mm left mid kidney nonobstructive renal calculus.  Stomach/Bowel: Small periampullary duodenal diverticulum.  Vascular/Lymphatic: Mild aortoiliac atherosclerotic calcification.  Reproductive: Unremarkable  Other: No supplemental non-categorized findings.  CT of the chest, abdomen and pelvis with contrast  Musculoskeletal: Considerable lower lumbar spondylosis and degenerative disc disease thought to be causing impingement at ultiple lumbar levels but most particularly at L5-S1 and L4-5. This appears significantly progressive compared to the exam from 09/14/2006. High density along the anterior epidural tissues at the L5 level probably from disc material or venous plexus. Spurring of both hips noted.  IMPRESSION: 1. 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 2. The 2-3 mm right upper lobe subpleural lymph node, unchanged from 09/05/2014. This documents 6 months of stability. If the patient is at high risk for bronchogenic carcinoma, follow-up chest CT in 6 months is recommended. If the patient is at low risk, no follow-up is needed. This recommendation follows the consensus statement: Guidelines for Management of Small Pulmonary Nodules Detected on CT Scans: A Statement from the [place] as published in Radiology 2005; 237:395-400. 3. 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 4. 6 mm nonobstructive left mid kidney renal calculus. 5. Mild atherosclerosis.  MR  Brain:  IMPRESSION: Abnormal MRI scan of the brain showing tiny scattered periventricular and subcortical nonspecific white matter hyperintensities with differential discussed above. No enhancing lesions are noted.  MRI Thoracic spine; Unremarkable  Interval update 03/15/2015: Maddyson Keil is a 60 y.o. female here as a referral from Dr. Arcelia Jew for progressive gait ataxia, left hand pain and weakness. Ms. Cervantez is a 60yo woman with PMHx of HTN, systolic CHF, GERD, Type 2 DM, and hyperlipidemia who presents today for the following: Progressive spasticity, weakness, falls, painful paresthesias unintentional significant weight loss decreased mobility. She was scheduled for an EMG nerve conduction today however MRI of the cervical cord today came back significant for T2 hyperintense lesion extending from C4-5 to inferior C7 level with focal spinal cord enhancement at C5-6 level. The spinal cord appears focally enlarged at the C6 level. May represent focal autoimmune, inflammatory, demyelinating, neoplastic or para-infectious etiologies. There is adjacent degenerative disc disease with mild spinal stenosis, and therefore compressive etiology may be considered. However the degree of abnormal cord signal appears to be out of proportion to the degree of compression, and therefore would consider workup of other etiologies.  Discussed at length with patient and son and further workup.  Interval update 03/01/2015: Nyoka Alcoser is a 60 y.o. female here as a  follow up. Here with her son.   She is still losing weight. She lost 12-14 pounds in a 2 week period. She is losing muscle mass now. She can't stand up. She can't walk for a long period of time. Her PT and OT was discontinued. Patient reports that they told her they were wasting her time. No incontinence. No problems swallowing. No PBA. No weakness in facial muscles. No muscle cramping. Still with left-sided arm pain and weakness. Continuous pain. Stiffness in the  legs.   Initial visit 12/21/2014: Sujata Maines is a 60 y.o. female here as a referral from Dr. Arcelia Jew for progressive gait ataxia, left hand pain and weakness. Ms. Mahr is a 60yo woman with PMHx of HTN, systolic CHF, GERD, Type 2 DM, and hyperlipidemia who presents today for the following:  She is having problems with the left hand. She has joint pain and out of nowhere the hand locked up. Stiffness, arthritic-type pain. Doesn't stop when she sleeps, the pain is continuous all day lone. Progressing and worsening. Left leg is weak. Also burning in the left leg. And coldness in both legs. They were in the ED January first, she felt like she couldn't move. Her legs were cold. She has lost a lot of weight quickly, over 100 pounds in the last 6 months. She eats the same, good appetite. she was told that her symptoms were related to arthritis and diabetic neuropathy. One month later they were in the ED again, decreased mobility and ataxia. Now she needs a cane to walk. Issues with balance and near falls. She has developed a frozen shoulder. Progressive, incapaciting pain 10/10 with difficulty performing daily tasks such as cleaning, toileting, bathing, cooking. Symptoms are mostly on the left side but her right leg is now affected. Assymmetric. Right leg slightly involved. Weakness as well as pain mostly on the left. She gets very hot. The left hand symptoms were acute, the other symptoms have been insidious and have slowly progressed. She has to have someone bathe her now. Balance is poor. She has not fallen but very off balance. No injuries, no inciting factors. She had stoped taking her insulin in December because of her weight loss she no longer needs it. Son is resent with her and provides much of the information.   EMG/NCS showed severe CTS. Had emg/ncs at [facility] orthopaedics.   Reviewed notes, labs and imaging from outside physicians, which showed:  DG Lumbar spine (reviewed images and agree with  the following) : No evidence for fracture. Loss of disc height is seen at L3-4 and L5-S1 advanced facet osteoarthritis is seen bilaterally in the lower lumbar levels. No worrisome lytic or sclerotic osseous abnormality.  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  DG Cervical Spine: The cervicothoracic junction is partially obscured by osseous and soft tissue overlap on the lateral view. Cervical spine alignment is maintained. Vertebral body heights are preserved. There is disc space narrowing at C4-C5, C5-C6, and C6-C7 with associated endplate spurs. There is bony neural foraminal narrowing at C5-C6 bilaterally. The dens is intact. Posterior elements appear well-aligned. There is no evidence of fracture. No prevertebral soft tissue edema.  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 Bony neural foraminal narrowing at C5-C6. No acute bony abnormality.  DG Hip: There is no evidence of fracture. There is osteoarthritis of both hips with mild joint space narrowing and marginal osteophytes. Sacroiliac joints appear normal. The patient has a tendency towards calcification at tendon insertions.  IMPRESSION: No acute finding. Osteoarthritis of both hips.  DG Left hand complete: Evaluation of the digits is limited due to flexed positioning. No fracture or dislocation. Mild osteoarthritis of the thumb and index inger metacarpal phalangeal joints with joint space narrowing and osteophytes. The joint spaces are otherwise maintained. There are no erosions or periosteal reaction. No focal soft tissue abnormality.  DG Shoulder: Mild osteoarthritis. No evidence of inflammatory arthropathy or acute bony abnormality.  No fracture or dislocation. The alignment and joint spaces are maintained. There is mild osteoarthritis at the acromioclavicular joint. There is inferior spurring from the glenoid. No definite abnormal soft tissue  calcifications.  IMPRESSION: Acromioclavicular and glenohumeral osteoarthritis. No acute bony abnormality.  She was seen in the ED in February for shoulder pain and limitations in movement. She was referred to Orthopaedics. Per notes, She also has hx of leg pain, knee pain, and hip pain which has been attributed to diabetic neuropathy and arthritis in the past. Has unbalance. CXR hip showed OA of both hips and mild joint space narrowing and osteophytes. SI joint was normal. Lumbar spine, degeneration disc And facet disease onf L3-L4, L5-S1. 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 Exam noted Patient's legs are cool to the touch. Bilateral ABI was within normal limits. Dxed with likely peripheral neuropathy from her diabetes and possible decreased arterial blood flow to the legs.  HgbA1c 6.8 06/2014   Social History   Social History  . Marital status: Single    Spouse name: N/A  . Number of children: 3  . Years of education: 12   Occupational History  . Unemployed    Social History Main Topics  . Smoking status: Current Some Day Smoker    Packs/day: 0.30    Types: Cigarettes    Last attempt to quit: 04/18/2013  . Smokeless tobacco: Current User  . Alcohol use No  . Drug use: No  . Sexual activity: Not on file   Other Topics Concern  . Not on file   Social History Narrative   to get supplies nfrom [facility] per pt request.1--[phone]   Lives at home with son.    Right handed.   Caffeine use: drinks coffee-rare    Family History  Problem Relation Age of Onset  . Diabetes Mother   . Heart disease Mother   . Prostate cancer Father   . Neuropathy Neg Hx     Past Medical History:  Diagnosis Date  . CHF (congestive heart failure) ([place])   . Chronic  headaches   . DJD (degenerative joint disease)   . DM (diabetes mellitus) ([place]) 2008   stable HgBA1C at 6.5  . GERD (gastroesophageal reflux disease)    well controlled on Omeprazole  . HLD (hyperlipidemia) 2008   stable, well controlled  . Hypertension    well controlled    Past Surgical History:  Procedure Laterality Date  . carpel tunnel Right    20 yrs ago  . CERVICAL SPINE SURGERY  2016  . TUBAL LIGATION      Current Outpatient Prescriptions  Medication Sig Dispense Refill  . aspirin 81 MG EC tablet Take 1 tablet (81 mg total) by mouth daily. 90 tablet 1  . Calcium-Vitamin D 600-125 MG-UNIT TABS Take 1 tablet by mouth daily. Portis  each 1  . pregabalin (LYRICA) 75 MG capsule Take 75 mg in morning, 75 mg in evening, and 150 mg at bedtime. 120 capsule 2  . senna-docusate (SENOKOT-S) 8.6-50 MG tablet Take 2 tablets by mouth.    . simvastatin (ZOCOR) 40 MG tablet TAKE 1 TABLET ONCE DAILY. 90 tablet 1   No current facility-administered medications for this visit.     Allergies as of 07/15/2016 - Review Complete 07/15/2016  Allergen Reaction Noted  . Loratadine-pseudoephedrine er Hives     Vitals: BP 132/84 (BP Location: Right Arm, Patient Position: Sitting, Cuff Size: Large)   Pulse (!) 121   Ht '5\' 3"'  (1.6 m)   Wt 192 lb 12.8 oz (87.5 kg)   BMI 34.15 kg/m  Last Weight:  Wt Readings from Last 1 Encounters:  07/15/16 192 lb 12.8 oz (87.5 kg)   Last Height:   Ht Readings from Last 1 Encounters:  07/15/16 '5\' 3"'  (1.6 m)     Speech:  Speech is normal; fluent and spontaneous with normal comprehension.  Cognition:  The patient is oriented to person, place, and time;   recent and remote memory intact;   language fluent;   normal attention, concentration,   fund of knowledge Cranial Nerves:  The pupils are equal, round, and reactive to light. The fundi are flat. Visual fields are full to finger confrontation. Extraocular movements are intact.  Trigeminal sensation is intact and the muscles of mastication are normal. The face is symmetric. The palate elevates in the midline. Hearing intact. Voice is normal. Shoulder shrug is normal. The tongue has normal motion without fasciculations.   Coordination:  No dysmetria with right arm, difficult to perform with other extremities due to weakness  Gait:  spastic gait, slow, walking without a cane today but needs help from her son, noticeable left leg inversion with walking.  Motor Observation: assymetry of the left lower extremitiy without edema, left ankle larger circumference, some muscle wasting.  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 Mild increased tone right arm.  Posture:  Posture is mildly stooped   Strength: 4/5 strength proximally in the right arm with increased tone. 3/5 hip flexion weakness bilaterally with bilateral lower extremity spasticity, left arm weakness proximally 3/5 with significant spasticity more distally, decreased ROM left arm proximally (improved with pectoral muscle botox). Right lower extremity 4 out of 5 weakness in the long tract distribution of hip flexion knee extension and dorsiflexion, the left lower extremity mildly weaker in the same distribution.      Sensation: right leg decreased pp to mid calf Left leg decreased pp to the ankle Vibration 3 seconds on the left and 5 second on the right at thegreat toes Proprioception intact bilaterally Decreased temp to below knees bilaterally    Reflex Exam:  DTR's: Brick biceps, patellars and achilles for age and medical conditions   Toes:  Left toe upgoing  Clonus:  Clonus is absent.    Assessment/Plan: Kyan Giannone is a wonderful 60 y.o. female here as a  referral from Dr. Arcelia Jew for progressive gait ataxia and spasticity, left hand pain and paresis, lower extremity bilateral spastic paresis. Progressive spasticity, weakness, falls, painful paresthesias unintentional significant weight loss of over 100 pounds ,decreased mobility. Exam with decreased sensation distally, weakness necessitating wheelchair, increased tone in the lower  extremities with left upgoing toe, very brisk reflexes for age and medical conditions. 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   - Continue with Dr. Letta Pate and Dr. Naaman Plummer for physical rehabilitation as well as Dysport for spasticity.Will see if Dr. Letta Pate thinks botox of the distal left leg will help with gait. Recommend PT as well as OT after next botulinum injection -  Continue Lyrica daily.  - continue Lyrica for pain - she is doing exceptionally well and is in great hands with Dr. Letta Pate, follow up in 9 months or one year   [facility] [address] [place], [place] [address]  Phone [phone] Fax [phone]  A total of 60 minutes was spent face-to-face with this patient. Over half this time was spent on counseling patient on the cervical myelopathy and spastic quadriparesis diagnosis and different diagnostic and therapeutic options available.

## 2016-07-15 NOTE — Patient Instructions (Addendum)
Remember to drink plenty of fluid, eat healthy meals and do not skip any meals. Try to eat protein with a every meal and eat a healthy snack such as fruit or nuts in between meals. Try to keep a regular sleep-wake schedule and try to exercise daily, particularly in the form of walking, 20-30 minutes a day, if you can.   As far as your medications are concerned, I would like to suggest: Continue current medication and regimen  I would like to see you back in 9 months - one year, sooner if we need to. Please call us with any interim questions, concerns, problems, updates or refill requests.   Our phone number is [phone]. We also have an after hours call service for urgent matters and there is a physician on-call for urgent questions. For any emergencies you know to call 911 or go to the nearest emergency room

## 2016-08-22 ENCOUNTER — Ambulatory Visit: Payer: Medicare Other | Admitting: Physical Medicine & Rehabilitation

## 2016-08-22 ENCOUNTER — Encounter: Payer: Medicare Other | Attending: Physical Medicine & Rehabilitation

## 2016-08-22 DIAGNOSIS — I10 Essential (primary) hypertension: Secondary | ICD-10-CM | POA: Insufficient documentation

## 2016-08-22 DIAGNOSIS — R51 Headache: Secondary | ICD-10-CM | POA: Insufficient documentation

## 2016-08-22 DIAGNOSIS — E119 Type 2 diabetes mellitus without complications: Secondary | ICD-10-CM | POA: Insufficient documentation

## 2016-08-22 DIAGNOSIS — M1990 Unspecified osteoarthritis, unspecified site: Secondary | ICD-10-CM | POA: Insufficient documentation

## 2016-08-22 DIAGNOSIS — E785 Hyperlipidemia, unspecified: Secondary | ICD-10-CM | POA: Insufficient documentation

## 2016-08-22 DIAGNOSIS — I509 Heart failure, unspecified: Secondary | ICD-10-CM | POA: Insufficient documentation

## 2016-08-22 DIAGNOSIS — G8250 Quadriplegia, unspecified: Secondary | ICD-10-CM | POA: Insufficient documentation

## 2016-08-22 DIAGNOSIS — F17210 Nicotine dependence, cigarettes, uncomplicated: Secondary | ICD-10-CM | POA: Insufficient documentation

## 2016-08-22 DIAGNOSIS — K219 Gastro-esophageal reflux disease without esophagitis: Secondary | ICD-10-CM | POA: Insufficient documentation

## 2016-08-22 DIAGNOSIS — Z9889 Other specified postprocedural states: Secondary | ICD-10-CM | POA: Insufficient documentation

## 2016-08-25 ENCOUNTER — Other Ambulatory Visit: Payer: Self-pay | Admitting: Physical Medicine & Rehabilitation

## 2016-08-25 DIAGNOSIS — M4712 Other spondylosis with myelopathy, cervical region: Secondary | ICD-10-CM

## 2016-08-26 ENCOUNTER — Other Ambulatory Visit: Payer: Self-pay | Admitting: Neurology

## 2016-08-26 DIAGNOSIS — M4712 Other spondylosis with myelopathy, cervical region: Secondary | ICD-10-CM

## 2016-08-26 NOTE — Telephone Encounter (Signed)
Faxed printed rx lyrica 75 mg capsule qty 120 5 refills to pt pharmacy. [phone]. Received confirmation.

## 2016-08-28 ENCOUNTER — Telehealth: Payer: Self-pay

## 2016-08-28 NOTE — Telephone Encounter (Signed)
On August 28, 2016 NCCSR was reviewed. No conflict was seen on the [place] with multiple prescribers. Julie Elliott has signed a Narcotic Contract with our office. If there are any discrepancies this will be reported to her Physician.

## 2016-09-01 ENCOUNTER — Telehealth: Payer: Self-pay | Admitting: Neurology

## 2016-09-01 NOTE — Telephone Encounter (Signed)
Patient's son is calling regarding FMLA paperwork that is on file for the patient. Does paperwork need to be filled out again for recertification?

## 2016-09-01 NOTE — Telephone Encounter (Signed)
Called Julie Elliott back. Advised new paperwork will have to be faxed to [phone] for [place] to fill out again. He verbalized understanding. He stated he also went on leave from his job and has until middle of February for when he needs to use FMLA. Advised I will keep an eye out for FMLA to be faxed to [place].

## 2016-09-04 NOTE — Telephone Encounter (Signed)
AA,MD signed completed form. Gave to Hilda Blades in medical records to process.

## 2016-09-04 NOTE — Telephone Encounter (Signed)
Spoke with son, [name] and informed him that Laurence Ferrari has not received FMLA papers yet. He stated he was at work and would have [place] papers faxed over today. Gave him fax number for Emma's RN pod.

## 2016-09-04 NOTE — Telephone Encounter (Signed)
FMLA papers completed, sent to [facility] RN and Dr Jaynee Eagles for review and signature.

## 2016-09-08 ENCOUNTER — Encounter: Payer: Self-pay | Admitting: Physical Medicine & Rehabilitation

## 2016-09-08 ENCOUNTER — Encounter: Payer: Medicare Other | Attending: Physical Medicine & Rehabilitation

## 2016-09-08 ENCOUNTER — Ambulatory Visit (HOSPITAL_BASED_OUTPATIENT_CLINIC_OR_DEPARTMENT_OTHER): Payer: Medicare Other | Admitting: Physical Medicine & Rehabilitation

## 2016-09-08 VITALS — BP 136/85 | HR 92 | Resp 14

## 2016-09-08 DIAGNOSIS — K219 Gastro-esophageal reflux disease without esophagitis: Secondary | ICD-10-CM | POA: Insufficient documentation

## 2016-09-08 DIAGNOSIS — M1990 Unspecified osteoarthritis, unspecified site: Secondary | ICD-10-CM | POA: Diagnosis not present

## 2016-09-08 DIAGNOSIS — Z9889 Other specified postprocedural states: Secondary | ICD-10-CM | POA: Insufficient documentation

## 2016-09-08 DIAGNOSIS — R51 Headache: Secondary | ICD-10-CM | POA: Diagnosis not present

## 2016-09-08 DIAGNOSIS — G8110 Spastic hemiplegia affecting unspecified side: Secondary | ICD-10-CM | POA: Diagnosis not present

## 2016-09-08 DIAGNOSIS — E785 Hyperlipidemia, unspecified: Secondary | ICD-10-CM | POA: Insufficient documentation

## 2016-09-08 DIAGNOSIS — I10 Essential (primary) hypertension: Secondary | ICD-10-CM | POA: Insufficient documentation

## 2016-09-08 DIAGNOSIS — E119 Type 2 diabetes mellitus without complications: Secondary | ICD-10-CM | POA: Insufficient documentation

## 2016-09-08 DIAGNOSIS — G8250 Quadriplegia, unspecified: Secondary | ICD-10-CM | POA: Diagnosis not present

## 2016-09-08 DIAGNOSIS — F17210 Nicotine dependence, cigarettes, uncomplicated: Secondary | ICD-10-CM | POA: Insufficient documentation

## 2016-09-08 DIAGNOSIS — I509 Heart failure, unspecified: Secondary | ICD-10-CM | POA: Diagnosis not present

## 2016-09-08 NOTE — Progress Notes (Signed)
Dysport Injection for spasticity using needle EMG guidance  Dilution: 200 Units/ml Indication: Severe spasticity which interferes with ADL,mobility and/or  hygiene and is unresponsive to medication management and other conservative care Informed consent was obtained after describing risks and benefits of the procedure with the patient. This includes bleeding, bruising, infection, excessive weakness, or medication side effects. A REMS form is on file and signed. Needle:  needle electrode Number of units per muscle Dysport Left side Pectoralis 400 FDS 200 FDP, 200 FCU 100 FCR, 200 Pronator teres 100  Gastroc 300 All injections were done after obtaining appropriate EMG activity and after negative drawback for blood. The patient tolerated the procedure well. Post procedure instructions were given. A followup appointment was made.

## 2016-09-08 NOTE — Patient Instructions (Signed)
You received a Dysport injection today. You may experience muscle pains and aches. He may apply ice 20 minutes every 2 hours as needed for the next 24-48 hours. He also noticed bleeding or bruising in the areas that were injected. May apply Band-Aid. If this bruising is extensive, please notify our office. If there is evidence of increasing redness that occurs 2-3 days after injection. Please call our office. This could be a sign of infection. It is very rare, however. You may experience some muscle weakness in the muscles and injected. This would likely start in about one week.  

## 2016-10-20 ENCOUNTER — Ambulatory Visit (HOSPITAL_BASED_OUTPATIENT_CLINIC_OR_DEPARTMENT_OTHER): Payer: Medicare Other | Admitting: Physical Medicine & Rehabilitation

## 2016-10-20 ENCOUNTER — Encounter: Payer: Self-pay | Admitting: Physical Medicine & Rehabilitation

## 2016-10-20 ENCOUNTER — Encounter: Payer: Medicare Other | Attending: Physical Medicine & Rehabilitation

## 2016-10-20 VITALS — BP 117/77 | HR 98 | Resp 14

## 2016-10-20 DIAGNOSIS — M1990 Unspecified osteoarthritis, unspecified site: Secondary | ICD-10-CM | POA: Diagnosis not present

## 2016-10-20 DIAGNOSIS — R51 Headache: Secondary | ICD-10-CM | POA: Diagnosis not present

## 2016-10-20 DIAGNOSIS — E785 Hyperlipidemia, unspecified: Secondary | ICD-10-CM | POA: Diagnosis not present

## 2016-10-20 DIAGNOSIS — G8250 Quadriplegia, unspecified: Secondary | ICD-10-CM | POA: Diagnosis not present

## 2016-10-20 DIAGNOSIS — I10 Essential (primary) hypertension: Secondary | ICD-10-CM | POA: Insufficient documentation

## 2016-10-20 DIAGNOSIS — F17210 Nicotine dependence, cigarettes, uncomplicated: Secondary | ICD-10-CM | POA: Insufficient documentation

## 2016-10-20 DIAGNOSIS — E119 Type 2 diabetes mellitus without complications: Secondary | ICD-10-CM | POA: Insufficient documentation

## 2016-10-20 DIAGNOSIS — Z9889 Other specified postprocedural states: Secondary | ICD-10-CM | POA: Insufficient documentation

## 2016-10-20 DIAGNOSIS — K219 Gastro-esophageal reflux disease without esophagitis: Secondary | ICD-10-CM | POA: Diagnosis not present

## 2016-10-20 DIAGNOSIS — I509 Heart failure, unspecified: Secondary | ICD-10-CM | POA: Diagnosis not present

## 2016-10-20 NOTE — Patient Instructions (Signed)
Would recommend repeat Dysport 1500 U

## 2016-10-20 NOTE — Progress Notes (Signed)
Subjective:    Patient ID: Julie Elliott, female    DOB: Oct 23, 1955, 61 y.o.   MRN: [id number]  HPI Spasms better after Dysport injection 09/08/2016 Left side Pectoralis 400 FDS 200 FDP, 200 FCU 100 FCR, 200 Pronator teres 100  Gastroc 300  Able to move ankle better on Left  Ambulating with a cane, still needs some assistance for dressing Difficulty with buttons Pain Inventory Average Pain 0 Pain Right Now 0 My pain is no pain  In the last 24 hours, has pain interfered with the following? General activity 0 Relation with others 0 Enjoyment of life 0 What TIME of day is your pain at its worst? no pain Sleep (in general) Good  Pain is worse with: no pain Pain improves with: no pain Relief from Meds: no pain  Mobility walk with assistance use a cane how many minutes can you walk? 30 ability to climb steps?  yes do you drive?  no Do you have any goals in this area?  yes  Function disabled: date disabled . I need assistance with the following:  dressing and bathing Do you have any goals in this area?  yes  Neuro/Psych No problems in this area  Prior Studies Any changes since last visit?  no  Physicians involved in your care Any changes since last visit?  no   Family History  Problem Relation Age of Onset  . Diabetes Mother   . Heart disease Mother   . Prostate cancer Father   . Neuropathy Neg Hx    Social History   Social History  . Marital status: Single    Spouse name: N/A  . Number of children: 3  . Years of education: 12   Occupational History  . Unemployed    Social History Main Topics  . Smoking status: Current Some Day Smoker    Packs/day: 0.30    Types: Cigarettes    Last attempt to quit: 04/18/2013  . Smokeless tobacco: Current User  . Alcohol use No  . Drug use: No  . Sexual activity: Not Asked   Other Topics Concern  . None   Social History Narrative   to get supplies nfrom [facility] per pt request.1--[phone]   Lives  at home with son.    Right handed.   Caffeine use: drinks coffee-rare   Past Surgical History:  Procedure Laterality Date  . carpel tunnel Right    20 yrs ago  . CERVICAL SPINE SURGERY  2016  . TUBAL LIGATION     Past Medical History:  Diagnosis Date  . CHF (congestive heart failure) ([place])   . Chronic headaches   . DJD (degenerative joint disease)   . DM (diabetes mellitus) ([place]) 2008   stable HgBA1C at 6.5  . GERD (gastroesophageal reflux disease)    well controlled on Omeprazole  . HLD (hyperlipidemia) 2008   stable, well controlled  . Hypertension    well controlled   BP 117/77   Pulse 98   Resp 14   SpO2 97%   Opioid Risk Score:   Fall Risk Score:  `1  Depression screen PHQ 2/9  Depression screen [facility] 2/9 05/13/2016 11/06/2015 10/19/2015 08/14/2015 02/27/2015 10/17/2014 09/12/2014  Decreased Interest 0 0 1 0 0 0 0  Down, Depressed, Hopeless 0 0 0 0 0 0 0  PHQ - 2 Score 0 0 1 0 0 0 0  Altered sleeping - - 0 - - - -  Tired, decreased energy - - 0 - - - -  Change in appetite - - 0 - - - -  Feeling bad or failure about yourself  - - 0 - - - -  Trouble concentrating - - 0 - - - -  Moving slowly or fidgety/restless - - 0 - - - -  Suicidal thoughts - - 0 - - - -  PHQ-9 Score - - 1 - - - -  Difficult doing work/chores - - Not difficult at all - - - -    Review of Systems  Constitutional: Negative.   HENT: Negative.   Eyes: Negative.   Respiratory: Negative.   Cardiovascular: Negative.   Gastrointestinal: Negative.   Endocrine: Negative.   Genitourinary: Negative.   Musculoskeletal: Negative.   Skin: Negative.   Allergic/Immunologic: Negative.   Neurological: Negative.   Hematological: Negative.   Psychiatric/Behavioral: Negative.   All other systems reviewed and are negative.      Objective:   Physical Exam  Constitutional: She appears well-developed and well-nourished.  Nursing note and vitals reviewed.  motor strength is 3 minus, left deltoid, bicep,  tricep, trace, finger extensors 0, finger flexors, trace wrist extensor and flexors. Right upper extremity 4 minus at the deltoid by stress of grip. Right lower extremity for at the hip flexor, knee extensor, ankle dorsiflexor. Left lower extremity for at the hip flexor, knee extensor, 3 minus at the ankle dorsiflexor and plantar flexor Ambulates with a cane. No evidence of toe drag or knee instability. She does have difficulty getting her foot flat on the right side tends to be on her toes   Gritty sensation to LT in 4th and 5th digit bilateral     Assessment & Plan:  1. Cervical myelopathy with spastic quadriparesis, left greater than right We discussed that the Dysport injection would not have any effect on some of her dysesthetic pain. She should continue her Lyrica. Plan repeat Dysport same dosing and muscle selection, , not before 12/06/2016

## 2016-11-11 ENCOUNTER — Encounter: Payer: Self-pay | Admitting: Internal Medicine

## 2016-11-11 ENCOUNTER — Ambulatory Visit (INDEPENDENT_AMBULATORY_CARE_PROVIDER_SITE_OTHER): Payer: Medicare Other | Admitting: Internal Medicine

## 2016-11-11 VITALS — BP 131/77 | HR 84 | Temp 98.1°F | Ht 63.0 in | Wt 204.1 lb

## 2016-11-11 DIAGNOSIS — E1169 Type 2 diabetes mellitus with other specified complication: Secondary | ICD-10-CM

## 2016-11-11 DIAGNOSIS — G8250 Quadriplegia, unspecified: Secondary | ICD-10-CM

## 2016-11-11 DIAGNOSIS — E784 Other hyperlipidemia: Secondary | ICD-10-CM | POA: Diagnosis not present

## 2016-11-11 DIAGNOSIS — I10 Essential (primary) hypertension: Secondary | ICD-10-CM | POA: Diagnosis not present

## 2016-11-11 DIAGNOSIS — Z720 Tobacco use: Secondary | ICD-10-CM

## 2016-11-11 DIAGNOSIS — G629 Polyneuropathy, unspecified: Secondary | ICD-10-CM

## 2016-11-11 DIAGNOSIS — F17210 Nicotine dependence, cigarettes, uncomplicated: Secondary | ICD-10-CM

## 2016-11-11 DIAGNOSIS — G609 Hereditary and idiopathic neuropathy, unspecified: Secondary | ICD-10-CM

## 2016-11-11 DIAGNOSIS — E1165 Type 2 diabetes mellitus with hyperglycemia: Secondary | ICD-10-CM | POA: Diagnosis not present

## 2016-11-11 DIAGNOSIS — E785 Hyperlipidemia, unspecified: Principal | ICD-10-CM

## 2016-11-11 NOTE — Progress Notes (Signed)
   CC: Follow-up for peripheral neuropathy  HPI:  JulieJulie Elliott is a 61 y.o. woman with past medical history as noted below who presents today for follow-up of her peripheral neuropathy.  Peripheral neuropathy/spastic quadriparesis: Patient has a long history of peripheral neuropathy, particularly on her left arm and left leg. She was found to have a compressive cervical myelopathy status post decompressive surgery in September 2016. She reports she is still getting a warm sensation on her left side. She reports the Lyrica sometimes helps relieve this sensation. She would like to try a trial off of the Lyrica to see whether this is really helping her symptoms or not. She does not like to take medications. She is also receiving Dysport injections at Dr. Letta Pate' office which help alleviate the neuropathy and associated muscle spasms.   HTN: BP today is 131/77. She is not on any antihypertensives at this time. Her blood pressure had improved significantly when she had lost weight.  Hyperlipidemia: Patient reports she is no longer taking simvastatin. She stopped taking this 2 weeks ago. She would like to get off any medications that are not necessary.   Tobacco abuse: Patient reports she is still smoking one to 2 cigarettes daily. She states that she can quit anytime, but "doesn't see any reason to." She denies any shortness of breath, cough, or wheezing.  Past Medical History:  Diagnosis Date  . CHF (congestive heart failure) ([place])   . Chronic headaches   . DJD (degenerative joint disease)   . DM (diabetes mellitus) ([place]) 2008   stable HgBA1C at 6.5  . GERD (gastroesophageal reflux disease)    well controlled on Omeprazole  . HLD (hyperlipidemia) 2008   stable, well controlled  . Hypertension    well controlled    Review of Systems:   General: Denies fever, chills, night sweats, changes in appetite HEENT: Denies headaches, ear pain, changes in vision, rhinorrhea, sore throat CV:  Denies CP, palpitations, orthopnea Pulm: See HPI GI: Denies abdominal pain, nausea, vomiting, diarrhea, constipation, melena, hematochezia GU: Denies dysuria, hematuria, frequency Msk: Denies joint pains Neuro: Denies weakness Skin: Denies rashes, bruising Psych: Denies depression, anxiety, hallucinations  Physical Exam:  Vitals:   11/11/16 1555  BP: 131/77  Pulse: 84  Temp: 98.1 F (36.7 C)  TempSrc: Oral  SpO2: 98%  Weight: 204 lb 1.6 oz (92.6 kg)  Height: 5\' 3"  (1.6 m)   General: Well-nourished woman in NAD CV: RRR, no m/g/r Pulm: CTA bilaterally, breaths non-labored Ext: no peripheral edema  Assessment & Plan:   See Encounters Tab for problem based charting.  Patient discussed with Dr. Dareen Piano

## 2016-11-11 NOTE — Patient Instructions (Signed)
General Instructions: - Ok to take "drug holiday" from Lyrica. If you notice spasms are worse, then restart. - Will check cholesterol today. Will let you know if you need to restart Simvastatin - Please stop smoking - Follow up in 6 months or sooner if needed  Please bring your medicines with you each time you come to clinic.  Medicines may include prescription medications, over-the-counter medications, herbal remedies, eye drops, vitamins, or other pills.   Progress Toward Treatment Goals:  Treatment Goal 02/27/2015  Hemoglobin A1C at goal  Blood pressure at goal  Stop smoking -    Self Care Goals & Plans:  Self Care Goal 11/11/2016  Manage my medications take my medicines as prescribed; refill my medications on time; bring my medications to every visit  Monitor my health keep track of my blood pressure; check my feet daily  Eat healthy foods eat more vegetables; eat foods that are low in salt; eat baked foods instead of fried foods  Be physically active find an activity I enjoy  Stop smoking set a quit date and stop smoking    Home Blood Glucose Monitoring 06/20/2014  Check my blood sugar once a day  When to check my blood sugar -     Care Management & Community Referrals:  Referral 10/17/2014  Referrals made for care management support nutritionist

## 2016-11-12 LAB — LIPID PANEL
CHOLESTEROL TOTAL: 166 mg/dL (ref 100–199)
Chol/HDL Ratio: 3.1 ratio (ref 0.0–4.4)
HDL: 54 mg/dL (ref >39)
LDL Calculated: 99 mg/dL (ref 0–99)
Triglycerides: 64 mg/dL (ref 0–149)
VLDL Cholesterol Cal: 13 mg/dL (ref 5–40)

## 2016-11-13 NOTE — Assessment & Plan Note (Signed)
Reports her spasms are better controlled after getting the injections at Dr. Bonney Aid' office. Advised her to continue following with him. She will try a drug holiday off of the Lyrica. She was advised to restart Lyrica if her symptoms worsen. Will follow up how she is doing at her next visit.

## 2016-11-13 NOTE — Assessment & Plan Note (Signed)
Patient wishes to take a drug holiday from her Lyrica. Discussed this extensively with patient and agree for her to try this, but advised her to restart the Lyrica if the warm/burning sensation worsens. Will follow up how this is doing at her next visit.

## 2016-11-13 NOTE — Assessment & Plan Note (Signed)
BP is elevated today. She has been off of antihypertensives after she lost weight. However, she has gained about 30 pounds in the last several months. Will discuss with patient that she needs to maintain her weight to avoid going back on antihypertensives.

## 2016-11-13 NOTE — Assessment & Plan Note (Signed)
Advised patient to stop smoking completely. We discussed the adverse side effects of smoking and how this can impact her health. She states she will stop smoking. Will follow up if she has quit at her next visit

## 2016-11-13 NOTE — Progress Notes (Signed)
Internal Medicine Clinic Attending  Case discussed with Dr. Rivet soon after the resident saw the patient.  We reviewed the resident's history and exam and pertinent patient test results.  I agree with the assessment, diagnosis, and plan of care documented in the resident's note.  

## 2016-11-13 NOTE — Assessment & Plan Note (Addendum)
Will check lipids today as patient has been off of her statin. Her ASCVD risk score is 5.6%. If her lipids are markedly elevated then will have her restart Simvastatin.  Will also discuss maintaining her weight as she has had significant weight gain over the last few months after losing a lot of weight previously.

## 2016-11-19 ENCOUNTER — Telehealth: Payer: Self-pay | Admitting: *Deleted

## 2016-11-19 NOTE — Telephone Encounter (Signed)
Pt was last seen in Dec for cervical myelopathy w/ resultant paresis and spasticity. At that time Dr. Jaynee Eagles recommended - pt continue with Dr. Letta Pate and Dr. Naaman Plummer for physical rehabilitation as well as Dysport for spasticity - see if Dr. Letta Pate thinks botox of the distal left leg will help with gait - recommend PT as well as OT after next botulinum injection - continue Lyrica daily.   Pt had her 6 wk Dysport f/u w/ Dr. Letta Pate 10/20/16 and spasms reportedly better after injection in Feb w/ plans to repeat Dysport in May.  Spoke to pt's son who feels that pt has declined some since completing therapy. F/u appt scheduled Mon 12/08/16.

## 2016-11-24 ENCOUNTER — Telehealth: Payer: Self-pay

## 2016-11-24 NOTE — Telephone Encounter (Signed)
[employer] claim # [id number] forms completed for pt's son, reviewed/signed by MD and returned to medical records for payment/processing.

## 2016-12-08 ENCOUNTER — Ambulatory Visit (INDEPENDENT_AMBULATORY_CARE_PROVIDER_SITE_OTHER): Payer: Medicare Other | Admitting: Neurology

## 2016-12-08 ENCOUNTER — Encounter: Payer: Self-pay | Admitting: Neurology

## 2016-12-08 ENCOUNTER — Encounter (INDEPENDENT_AMBULATORY_CARE_PROVIDER_SITE_OTHER): Payer: Self-pay

## 2016-12-08 VITALS — BP 149/91 | HR 84 | Ht 63.0 in | Wt 207.8 lb

## 2016-12-08 DIAGNOSIS — R270 Ataxia, unspecified: Secondary | ICD-10-CM | POA: Diagnosis not present

## 2016-12-08 DIAGNOSIS — R531 Weakness: Secondary | ICD-10-CM

## 2016-12-08 DIAGNOSIS — R252 Cramp and spasm: Secondary | ICD-10-CM | POA: Diagnosis not present

## 2016-12-08 DIAGNOSIS — R208 Other disturbances of skin sensation: Secondary | ICD-10-CM | POA: Diagnosis not present

## 2016-12-08 DIAGNOSIS — G959 Disease of spinal cord, unspecified: Secondary | ICD-10-CM

## 2016-12-08 NOTE — Progress Notes (Signed)
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   Interval history 12/11/2016: She has a warm hot sensation from the waist down. Radiates from the lateral abdomen to the legs. Feels like a dry heat. Intense heat. Progressive. More weakness in the left leg. Left side worse than the right side. She denies back pain. She has had falls. The sensation is disturbing. She has urinated on herself. Like putting icy hot on her skin. Lyrica is not helping. Progressive weakness and stiffness. She has fallen. Very unsteady, ataxic. Progressively worsening.  Within the last few months. Need MRI cervical spine and thoracic spine  Interval history 07/15/2016: She is doing well. She continues to get botox in the left arm, switching to dysport soon. Spasms are improved. She has swelling in both legs.  She is walking well with a cane, she fell the other day she got up too fast as as she was walking she stumbled in the hallway and she fell on the floor. This is the first fall, she became off balance because she had to go to the bathroom and going too fast. No pain. She has swelling and numbness. No pain. She still takes lyrica. She does not need baclofen or benzodiazepines. She feels the botox has significantly helped. Son feels like she is better, she is moving faster, he doesn't have to helpe her in and out of the car as much. They got her a lift chair. They may start physical therapy after the next botox treatment. She would also benefit from occupational in addition to physical therapy for help with use of the left hand especially with cooking.   Interval history 01/10/2016: Patient feels like she has been doing excellent. She is walking much better, she loves physical therapy, she is getting better. Botox with Julie Elliott is significantly helping with range  of motion and pain in her left arm. She is working with Julie Elliott in physical therapy. Using a cane but can walk without it. She is doing great with botox. Lyrica helps at night. Tizanidine helps in the morning takes it once a day with breakfast for spasticity but doesn't want to take any more than that due to side effects. She still has difficulty due to her weakness and spasticity with changing positions in bed, and this week she had an irritation on her back which was treated concerning for skin erosion. Discussed mattresses that can help prevent skin erosion and ulcers. Will request a mattress to try and avoid bed sores. She has a irritation in the lower back now healing, worried about bed sores and will try and request a full size mattress to address this problem. Will talk to [facility]. Patient is here with her son as well. Her weight has stabilized. Son provides information. Had a long talk about what we can do in the future including continuing Botox, continued physical therapy, stretching and strengthening, other medications to help with spasticity and pain, and most important mattress to help address and prevent possible bed sores. Physical therapy ordered an orthosis for her left foot due to her abnormal gait because of foot inversion due to myelopathy, we'll discuss with Julie Elliott and possibly he can help with Botox..   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 She is doing exceptionally well. The spasticity is improving and so it the pain. She still has balance problems but is up walking around with a cane, no falls. Have recommended baclofen for the spasticity but she is worried about taking it. I also recommend Rehab with Dr. Letta Elliott and Julie Elliott as well as botox for the left arm spasticity. The weight loss has  stabilized. She needs podiatry for her toe nail problems. I am extremely pleased with her progress after surgery with Dr. Rigoberto Elliott at [facility], a remarkable improvement. She has some spasms in the left arm and both legs. The spasms are less with the Lyrica. The Baclofen didn't help.Her right leg has gotten weaker. She lives with her son who cares for her. Sleeping well at night. She has lost weight. She has spasms mostly in the left arm and mostly in the hand. She needs PT, there was an error in the referral to [place] and Julie Elliott which has been corrected. Discussed spasticity with patient. Have prescribed baclofen for patient but she is not interested in taking it, even though it can help with her spasticity. Discussed increasing baclofen and she declines, it makes her sleepy and difficult to walk. She is urinating fine, but bowel movements are difficult  Interval update 04/15/2015: Julie Elliott is a 61 y.o. female here as a referral from Julie Elliott for progressive gait ataxia, left hand pain and weakness. Julie Elliott is a 61yo woman with PMHx of HTN, systolic CHF, GERD, Type 2 DM, and hyperlipidemia who presents today for the following: Progressive spasticity, weakness, falls, painful paresthesias unintentional significant weight loss of over 100 pounds, decreased mobility. MRI of the cervical cord is significant for T2 hyperintense lesion extending from C4-C7 with focal spinal cord enhancement at C5-C6. She has gone through extensive workup and returns today to review, with son. Repeat MRI of the cervical spine, see below, consistent with compressive myelopathy of the spinal cord due to spondylosis.She is gone through extensive testing (see below) including a nuclear medicine PET scan whose findings were largely unremarkable except for asymmetric metabolic activity in the RIGHT vallecula region of the hypopharynx para the RIGHT (will send for direct visualization to rule out head and neck malignancy with Dr.  Ernesto Elliott), CT of the chest abdomen and pelvis were largely unremarkable and no malignancy was found. Extensive serum testing includes the following unremarkable labs: CMP, CBC, TSH, sedimentation rate, HIV with negative reflex, ANA with rheumatologic comprehensive panel, vitamin B1, heavy metals in the blood, pan-ANCA panel, multiple myeloma panel, vitamin B6, rheumatoid factor, paraneoplastic profile, high sensitivity CRP, hep C antibody, RPR,, B12 and folate, angiotensin-converting enzyme, hemoglobin A1c, CK, NMO, copper, ceruloplasmin, HTLV-1 and 2 antibodies, JCV,   CSF studies were completed which include normal myelin basic protein, positive CSF oligoclonal pans greater than 5 that are not present in the patient's corresponding serum sample,negative Lyme antibodies, normal CNS IgG index, negative Echo virus antibodies, a high index of varus sellar zoster IgG in the CSF was found at 2518 (upper limit normal of 135) however these antibodies were also found in an higher quantity the serum and so this is not consistent with intrathecal production, negative Epstein-Barr PCR, negative VDRL, negative CMV PCR, negative HSV PCR, still pending VZV DNA PCR, fungus culture with smear in progress, cell count with differential unremarkable, normal glucose and elevated protein.   Repeat MRI cervical cord: FINDINGS: :  On sagittal images,  the spine is imaged from above the cervicomedullary junction to T3T4. There is increased signal within the spinal cord from C4-C5 to C7 on T2-weighted and STIR images. There is focal enlargement of the spinal cord adjacent to C5C6 and C6. There is relatively flat enhancement within this region only adjacent to C5C6.   The vertebral bodies are normally aligned. The vertebral bodies have normal signal. The discs and interspaces were further evaluated on axial views from C2 to T1 as follows: C2 - C3: The disc and interspace appear normal. C3 -C4: There is mild  uncovertebral spurring and disc bulging causing moderate bilateral foraminal narrowing. There is no definite nerve root compression, but there is some encroachment of the exiting C4 nerve roots. C4 -C5: There is mild bilateral uncovertebral spurring and imaging causing severe bilateral foraminal narrowing. There could be compression of either of the C5 nerve roots. Posteriorly within the spinal cord, there is hyperintense signal on T2 images extending inferiorly.  C5 -C6: There is moderate spinal stenosis due to broad disc protrusion, uncovertebral spurring and facet hypertrophy. There is severe left and very severe right foraminal narrowing leading to right C6 and possible left C6 nerve root compression. The entire spinal cord is hyperintense C6 -C7: There is broad disc protrusion and facet hypertrophy causing severe left and moderate right foraminal narrowing. There is possible left C7 nerve root compression. Abnormal signal is seen in the entire spinal cord at the interspace and medially adjacent to the C7 vertebral body.  C7 -T1: The disc and interspace appear normal. Hyperintense signal is noted medially and anteriorly only, within the spinal cord T1 - T2: The disc and interspace appear normal.   Compared to the MRI dated 03/13/2015, there is no definite change.   IMPRESSION: This is an abnormal MRI of the cervical spine with and without contrast showing: 1. Abnormal T2 hyperintense signal within the spinal cord from C4C5 to C7 with mild expansion of the cord adjacent to C5C6 to C6. There is "pancake-like" flat enhancement within the spinal cord only adjacent to C5C6. The findings are stable compared to 03/13/2015. 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 Inflammatory, demyelinating and  neoplastic etiologies remain in the differential diagnosis 2. At [place], there is severe foraminal narrowing bilaterally that could lead to compression of either c5 nerve root. 3. At C5C6, there is moderate spinal stenosis and very severe right and severe left foraminal narrowing. There is probable right and possible left C6 nerve root compression.  4. At C6C7, there is severe left foraminal narrowing with possible left C7 nerve root compression  An extensive workup has been completed on patient without another etiology found except for possible compressive spondylosis of the spine. Workup consisted of  Nuclear medicine PET image: FINDINGS: NECK There is increased activity within the spinal cord from the skullbase through C7. There is focal activity at C5-C6 which corresponds to the lesion on comparison MRI. No hypermetabolic cervical lymphadenopathy.   There is asymmetric metabolic activity within the RIGHT vallecula compared to the LEFT (image 24 of the fused data set). This activity is intense with SUV max equal 11.6 compared to prior wall 7.0 on the contralateral side. No discrete mass lesions identified.   Here is a focus of activity at the LEFT small first rib and clavicle junction (image 48 of fused data set) without  associated finding on the CT portion.  CHEST  No hypermetabolic mediastinal or hilar nodes. 4 mm on palm lung nodule along the RIGHT oblique fissure on image (image 25 with, series 6).  ABDOMEN/PELVIS  No abnormal hypermetabolic activity within the liver, pancreas, adrenal glands, or spleen. No hypermetabolic lymph nodes in the abdomen or pelvis. Nonobstructing calculus in the LEFT kidney measures 7 mm. Uterus and ovaries are normal.  SKELETON  No focal hypermetabolic activity to suggest skeletal metastasis.  IMPRESSION: 1. Hypermetabolic focus within the spinal cord corresponds to enhancing lesion on comparison cervical MRI. Differential  includes inflammatory process, infectious process, or neoplastic process.). 2. Asymmetric metabolic activity in the RIGHT vallecula region of the hypopharynx para the RIGHT. Consider direct visualization if concern for head and neck malignancy. 3. Asymmetric uptake at the junction of the first rib LEFT clavicle is likely degenerative as there is no lesion identified on comparison CT 4. Small nodule along the RIGHT oblique fissure is likely benign. If the patient is at high risk for bronchogenic carcinoma, follow-up chest CT at 1 year is recommended. If the patient is at low risk, no follow-up is needed. This recommendation follows the consensus statement: Guidelines for Management of Small Pulmonary Nodules Detected on CT Scans: A Statement from the [place] as published in Radiology 2005; 237:395-400.  CT ABDOMEN PELVIS FINDINGS  Hepatobiliary: Cholecystectomy.  Pancreas: Unremarkable  Spleen: Unremarkable  Adrenals/Urinary Tract: 6 mm left mid kidney nonobstructive renal calculus.  Stomach/Bowel: Small periampullary duodenal diverticulum.  Vascular/Lymphatic: Mild aortoiliac atherosclerotic calcification.  Reproductive: Unremarkable  Other: No supplemental non-categorized findings.  CT of the chest, abdomen and pelvis with contrast  Musculoskeletal: Considerable lower lumbar spondylosis and degenerative disc disease thought to be causing impingement at ultiple lumbar levels but most particularly at L5-S1 and L4-5. This appears significantly progressive compared to the exam from 09/14/2006. High density along the anterior epidural tissues at the L5 level probably from disc material or venous plexus. Spurring of both hips noted.  IMPRESSION: 1. 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 2. The 2-3 mm right upper lobe subpleural lymph node, unchanged from 09/05/2014. This documents 6 months of  stability. If the patient is at high risk for bronchogenic carcinoma, follow-up chest CT in 6 months is recommended. If the patient is at low risk, no follow-up is needed. This recommendation follows the consensus statement: Guidelines for Management of Small Pulmonary Nodules Detected on CT Scans: A Statement from the [place] as published in Radiology 2005; 237:395-400. 3. 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 4. 6 mm nonobstructive left mid kidney renal calculus. 5. Mild atherosclerosis.  MR Brain:  IMPRESSION: Abnormal MRI scan of the brain showing tiny scattered periventricular and subcortical nonspecific white matter hyperintensities with differential discussed above. No enhancing lesions are noted.  MRI Thoracic spine; Unremarkable  Interval update 03/15/2015: Julie Elliott is a 61 y.o. female here as a referral from Julie Elliott for progressive gait ataxia, left hand pain and weakness. Julie Elliott is a 61yo woman with PMHx of HTN, systolic CHF, GERD, Type 2 DM, and hyperlipidemia who presents today for the following: Progressive spasticity, weakness, falls, painful paresthesias unintentional significant weight loss decreased mobility. She was scheduled for an EMG nerve conduction today however MRI of the cervical cord today came back significant for T2 hyperintense lesion extending from C4-5 to inferior C7 level with focal spinal cord enhancement at  C5-6 level. The spinal cord appears focally enlarged at the C6 level. May represent focal autoimmune, inflammatory, demyelinating, neoplastic or para-infectious etiologies. There is adjacent degenerative disc disease with mild spinal stenosis, and therefore compressive etiology may be considered. However the degree of abnormal cord signal appears to be out of proportion to the degree of compression, and therefore would consider workup of other etiologies.  Discussed at length with  patient and son and further workup.  Interval update 03/01/2015: Julie Elliott is a 61 y.o. female here as a follow up. Here with her son.   She is still losing weight. She lost 12-14 pounds in a 2 week period. She is losing muscle mass now. She can't stand up. She can't walk for a long period of time. Her PT and OT was discontinued. Patient reports that they told her they were wasting her time. No incontinence. No problems swallowing. No PBA. No weakness in facial muscles. No muscle cramping. Still with left-sided arm pain and weakness. Continuous pain. Stiffness in the legs.   Initial visit 12/21/2014: Julie Elliott is a 61 y.o. female here as a referral from Julie Elliott for progressive gait ataxia, left hand pain and weakness. Ms. Boer is a 61yo woman with PMHx of HTN, systolic CHF, GERD, Type 2 DM, and hyperlipidemia who presents today for the following:  She is having problems with the left hand. She has joint pain and out of nowhere the hand locked up. Stiffness, arthritic-type pain. Doesn't stop when she sleeps, the pain is continuous all day lone. Progressing and worsening. Left leg is weak. Also burning in the left leg. And coldness in both legs. They were in the ED January first, she felt like she couldn't move. Her legs were cold. She has lost a lot of weight quickly, over 100 pounds in the last 6 months. She eats the same, good appetite. she was told that her symptoms were related to arthritis and diabetic neuropathy. One month later they were in the ED again, decreased mobility and ataxia. Now she needs a cane to walk. Issues with balance and near falls. She has developed a frozen shoulder. Progressive, incapaciting pain 10/10 with difficulty performing daily tasks such as cleaning, toileting, bathing, cooking. Symptoms are mostly on the left side but her right leg is now affected. Assymmetric. Right leg slightly involved. Weakness as well as pain mostly on the left. She gets very hot. The left  hand symptoms were acute, the other symptoms have been insidious and have slowly progressed. She has to have someone bathe her now. Balance is poor. She has not fallen but very off balance. No injuries, no inciting factors. She had stoped taking her insulin in December because of her weight loss she no longer needs it. Son is resent with her and provides much of the information.   EMG/NCS showed severe CTS. Had emg/ncs at [facility].   Reviewed notes, labs and imaging from outside physicians, which showed:  DG Lumbar spine (reviewed images and agree with the following) : No evidence for fracture. Loss of disc height is seen at L3-4 and L5-S1 advanced facet osteoarthritis is seen bilaterally in the lower lumbar levels. No worrisome lytic or sclerotic osseous abnormality.  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  DG Cervical Spine: The cervicothoracic junction is partially obscured by osseous and soft tissue overlap on the lateral view. Cervical spine alignment is maintained. Vertebral body heights are preserved. There is disc space narrowing at C4-C5, C5-C6,  and C6-C7 with associated endplate spurs. There is bony neural foraminal narrowing at C5-C6 bilaterally. The dens is intact. Posterior elements appear well-aligned. There is no evidence of fracture. No prevertebral soft tissue edema.  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 Bony neural foraminal narrowing at C5-C6. No acute bony abnormality.  DG Hip: There is no evidence of fracture. There is osteoarthritis of both hips with mild joint space narrowing and marginal osteophytes. Sacroiliac joints appear normal. The patient has a tendency towards calcification at tendon insertions.  IMPRESSION: No acute finding. Osteoarthritis of both hips.  DG Left hand complete: Evaluation of the digits is limited due to flexed positioning. No fracture or dislocation. Mild  osteoarthritis of the thumb and index inger metacarpal phalangeal joints with joint space narrowing and osteophytes. The joint spaces are otherwise maintained. There are no erosions or periosteal reaction. No focal soft tissue abnormality.  DG Shoulder: Mild osteoarthritis. No evidence of inflammatory arthropathy or acute bony abnormality.  No fracture or dislocation. The alignment and joint spaces are maintained. There is mild osteoarthritis at the acromioclavicular joint. There is inferior spurring from the glenoid. No definite abnormal soft tissue calcifications.  IMPRESSION: Acromioclavicular and glenohumeral osteoarthritis. No acute bony abnormality.  She was seen in the ED in February for shoulder pain and limitations in movement. She was referred to Orthopaedics. Per notes, She also has hx of leg pain, knee pain, and hip pain which has been attributed to diabetic neuropathy and arthritis in the past. Has unbalance. CXR hip showed OA of both hips and mild joint space narrowing and osteophytes. SI joint was normal. Lumbar spine, degeneration disc And facet disease onf L3-L4, L5-S1. 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 Exam noted Patient's legs are cool to the touch. Bilateral ABI was within normal limits. Dxed with likely peripheral neuropathy from her diabetes and possible decreased arterial blood flow to the legs.  HgbA1c 6.8 06/2014   Social History   Social History  . Marital status: Single    Spouse name: N/A  . Number of children: 3  . Years of education: 12   Occupational History  . Unemployed    Social History Main Topics  . Smoking status: Current Some Day Smoker    Types: Cigarettes    Last attempt to quit: 04/18/2013  . Smokeless tobacco:  Current User     Comment: 1-2 cigs in the a.m.  Marland Kitchen Alcohol use No  . Drug use: No  . Sexual activity: Not on file   Other Topics Concern  . Not on file   Social History Narrative   to get supplies nfrom [facility] per pt request.1--[phone]   Lives at home with son.    Right handed.   Caffeine use: drinks coffee-rare    Family History  Problem Relation Age of Onset  . Diabetes Mother   . Heart disease Mother   . Prostate cancer Father   . Neuropathy Neg Hx     Past Medical History:  Diagnosis Date  . CHF (congestive heart failure) ([place])   . Chronic headaches   . DJD (degenerative joint disease)   . DM (diabetes mellitus) ([place]) 2008   stable HgBA1C at 6.5  . GERD (gastroesophageal reflux disease)    well controlled on Omeprazole  .  HLD (hyperlipidemia) 2008   stable, well controlled  . Hypertension    well controlled    Past Surgical History:  Procedure Laterality Date  . carpel tunnel Right    20 yrs ago  . CERVICAL SPINE SURGERY  2016  . TUBAL LIGATION      Current Outpatient Prescriptions  Medication Sig Dispense Refill  . aspirin 81 MG EC tablet Take 1 tablet (81 mg total) by mouth daily. 90 tablet 1  . LYRICA 75 MG capsule TAKE 1 CAPSULE IN THE MORNING, 1 CAPSULE IN THE EVENING AND 2 AT BEDTIME 120 capsule 5  . senna-docusate (SENOKOT-S) 8.6-50 MG tablet Take 2 tablets by mouth.     No current facility-administered medications for this visit.     Allergies as of 12/08/2016 - Review Complete 12/08/2016  Allergen Reaction Noted  . Loratadine-pseudoephedrine er Hives     Vitals: BP (!) 149/91   Pulse 84   Ht '5\' 3"'  (1.6 m)   Wt 207 lb 12.8 oz (94.3 kg)   BMI 36.81 kg/m  Last Weight:  Wt Readings from Last 1 Encounters:  12/08/16 207 lb 12.8 oz (94.3 kg)   Last Height:   Ht Readings from Last 1 Encounters:  12/08/16 '5\' 3"'  (1.6 m)   Cranial Nerves:  The pupils are equal, round, and reactive to light. The fundi are flat. Visual fields are  full to finger confrontation. Extraocular movements are intact. Trigeminal sensation is intact and the muscles of mastication are normal. The face is symmetric. The palate elevates in the midline. Hearing intact. Voice is normal. Shoulder shrug is normal. The tongue has normal motion without fasciculations.   Coordination:  No dysmetria with right arm, difficult to perform with other extremities due to weakness  Gait:  spastic gait, slow, worsening and ataxic, needs help from her son, noticeable left leg inversion with walking.  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    Assessment/Plan: Julie Elliott is a wonderful 61 y.o. female here as a referral from Julie Elliott for progressive gait ataxia and spasticity, left hand pain and paresis, lower extremity bilateral spastic paresis. Progressive spasticity, weakness, falls, painful paresthesias unintentional significant weight loss of over 100 pounds ,decreased mobility. Exam with decreased sensation distally, weakness necessitating wheelchair, increased tone in the lower extremities with left upgoing toe, very brisk reflexes for age and medical conditions. 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  - Patient has worsening weakness, ataxia, spasticity and a thoracic sensory level need MRI of the cervical spine and thoracic spine to evaluate for acute  myelopathy and any possible surgical intervention. 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   - Continue with Dr. Letta Elliott and Dr. Naaman Elliott for physical rehabilitation as well as Dysport for spasticity. -  Continue Lyrica daily.  -  continue Lyrica for pain   [facility] [address] [place], [place] [address]  Phone [phone] Fax [phone]  A total of 30 minutes was spent face-to-face with this patient. Over half this time was spent on counseling patient on the ataxia diagnosis and different diagnostic and therapeutic options available.

## 2016-12-08 NOTE — Patient Instructions (Signed)
Remember to drink plenty of fluid, eat healthy meals and do not skip any meals. Try to eat protein with a every meal and eat a healthy snack such as fruit or nuts in between meals. Try to keep a regular sleep-wake schedule and try to exercise daily, particularly in the form of walking, 20-30 minutes a day, if you can.   As far as your medications are concerned, I would like to suggest: Continue Lyrica  As far as diagnostic testing: MRI cervical spine and thoracic spine  I would like to see you back in 3 months, sooner if we need to. Please call us with any interim questions, concerns, problems, updates or refill requests.   Our phone number is [phone]. We also have an after hours call service for urgent matters and there is a physician on-call for urgent questions. For any emergencies you know to call 911 or go to the nearest emergency room

## 2016-12-09 ENCOUNTER — Ambulatory Visit: Payer: Medicare Other | Admitting: Physical Medicine & Rehabilitation

## 2016-12-09 ENCOUNTER — Encounter: Payer: Self-pay | Admitting: Physical Medicine & Rehabilitation

## 2016-12-09 ENCOUNTER — Encounter: Payer: Medicare Other | Attending: Physical Medicine & Rehabilitation

## 2016-12-09 VITALS — BP 137/84 | HR 80

## 2016-12-09 DIAGNOSIS — E119 Type 2 diabetes mellitus without complications: Secondary | ICD-10-CM | POA: Diagnosis not present

## 2016-12-09 DIAGNOSIS — M1990 Unspecified osteoarthritis, unspecified site: Secondary | ICD-10-CM | POA: Insufficient documentation

## 2016-12-09 DIAGNOSIS — K219 Gastro-esophageal reflux disease without esophagitis: Secondary | ICD-10-CM | POA: Diagnosis not present

## 2016-12-09 DIAGNOSIS — G8250 Quadriplegia, unspecified: Secondary | ICD-10-CM | POA: Insufficient documentation

## 2016-12-09 DIAGNOSIS — I10 Essential (primary) hypertension: Secondary | ICD-10-CM | POA: Diagnosis not present

## 2016-12-09 DIAGNOSIS — I509 Heart failure, unspecified: Secondary | ICD-10-CM | POA: Insufficient documentation

## 2016-12-09 DIAGNOSIS — Z9889 Other specified postprocedural states: Secondary | ICD-10-CM | POA: Diagnosis not present

## 2016-12-09 DIAGNOSIS — R51 Headache: Secondary | ICD-10-CM | POA: Diagnosis not present

## 2016-12-09 DIAGNOSIS — F17210 Nicotine dependence, cigarettes, uncomplicated: Secondary | ICD-10-CM | POA: Diagnosis not present

## 2016-12-09 DIAGNOSIS — E785 Hyperlipidemia, unspecified: Secondary | ICD-10-CM | POA: Diagnosis not present

## 2016-12-09 NOTE — Patient Instructions (Signed)
You received a Dysport injection today. You may experience muscle pains and aches. He may apply ice 20 minutes every 2 hours as needed for the next 24-48 hours. He also noticed bleeding or bruising in the areas that were injected. May apply Band-Aid. If this bruising is extensive, please notify our office. If there is evidence of increasing redness that occurs 2-3 days after injection. Please call our office. This could be a sign of infection. It is very rare, however. You may experience some muscle weakness in the muscles and injected. Please call office if you develop swallowing problems or eye weakness  This would likely start in about one week.

## 2016-12-09 NOTE — Progress Notes (Signed)
Dysport Injection for spasticity using needle EMG guidance  Dilution: 200 Units/ml Indication: Severe spasticity which interferes with ADL,mobility and/or  hygiene and is unresponsive to medication management and other conservative care Informed consent was obtained after describing risks and benefits of the procedure with the patient. This includes bleeding, bruising, infection, excessive weakness, swallowing problems or medication side effects. A REMS form is on file and signed.  Patient states that she went to her neurologist yesterday with complaints of increasing weakness problem walking. I called her neurologist, Dr. Lavell Anchors who felt like patient's complaints are not related to botulinum toxin. Workup with cervical and lumbar MRI is being pursued, clearance for Dysport obtained. Needle: UE 27g 1", LE 25g 2" needle electrode Number of units per muscle Dysport Left side Pectoralis 400 FDS 200 biceps, 200 FCU 100 FCR, 200 Pronator teres 100  Gastroc 300 All injections were done after obtaining appropriate EMG activity and after negative drawback for blood. The patient tolerated the procedure well. Post procedure instructions were given. A followup appointment was made.   EMG activity at the FCU was minimal, would recommend increasing dose into the gastroc to 400 units at Next injection

## 2016-12-19 ENCOUNTER — Inpatient Hospital Stay: Admit: 2016-12-19 | Primary: Registered Nurse

## 2016-12-19 LAB — SENTARA SPECIMEN COLLN.

## 2016-12-20 ENCOUNTER — Inpatient Hospital Stay
Admit: 2016-12-20 | Discharge: 2016-12-21 | Disposition: A | Discharge status: Home / Self Care | Payer: PRIVATE HEALTH INSURANCE | Attending: Hospitalist | Admitting: Hospitalist

## 2016-12-20 DIAGNOSIS — D509 Iron deficiency anemia, unspecified: Secondary | ICD-10-CM

## 2016-12-20 LAB — CBC WITH AUTOMATED DIFF
ABS. BASOPHILS: 0 10*3/uL (ref 0.0–0.06)
ABS. EOSINOPHILS: 0.1 10*3/uL (ref 0.0–0.4)
ABS. LYMPHOCYTES: 0.9 10*3/uL (ref 0.9–3.6)
ABS. MONOCYTES: 0.4 10*3/uL (ref 0.05–1.2)
ABS. NEUTROPHILS: 3 10*3/uL (ref 1.8–8.0)
BASOPHILS: 1 % (ref 0–2)
EOSINOPHILS: 2 % (ref 0–5)
HCT: 11.3 % — CL (ref 35.0–45.0)
HGB: 3.1 g/dL — CL (ref 12.0–16.0)
LYMPHOCYTES: 21 % (ref 21–52)
MCH: 17.6 PG — ABNORMAL LOW (ref 24.0–34.0)
MCHC: 27.4 g/dL — ABNORMAL LOW (ref 31.0–37.0)
MCV: 64.2 FL — ABNORMAL LOW (ref 74.0–97.0)
MONOCYTES: 9 % (ref 3–10)
MPV: 8.6 FL — ABNORMAL LOW (ref 9.2–11.8)
NEUTROPHILS: 67 % (ref 40–73)
PLATELET: 291 10*3/uL (ref 135–420)
RBC: 1.76 M/uL — ABNORMAL LOW (ref 4.20–5.30)
RDW: 20.9 % — ABNORMAL HIGH (ref 11.6–14.5)
WBC: 4.4 10*3/uL — ABNORMAL LOW (ref 4.6–13.2)

## 2016-12-20 LAB — METABOLIC PANEL, COMPREHENSIVE
A-G Ratio: 1 (ref 0.8–1.7)
ALT (SGPT): 23 U/L (ref 13–56)
AST (SGOT): 18 U/L (ref 15–37)
Albumin: 3.2 g/dL — ABNORMAL LOW (ref 3.4–5.0)
Alk. phosphatase: 85 U/L (ref 45–117)
Anion gap: 7 mmol/L (ref 3.0–18)
BUN/Creatinine ratio: 31 — ABNORMAL HIGH (ref 12–20)
BUN: 21 MG/DL — ABNORMAL HIGH (ref 7.0–18)
Bilirubin, total: 0.2 MG/DL (ref 0.2–1.0)
CO2: 29 mmol/L (ref 21–32)
Calcium: 10 MG/DL (ref 8.5–10.1)
Chloride: 106 mmol/L (ref 100–108)
Creatinine: 0.68 MG/DL (ref 0.6–1.3)
GFR est AA: >60 mL/min/{1.73_m2} (ref >60)
GFR est non-AA: >60 mL/min/{1.73_m2} (ref >60)
Globulin: 3.1 g/dL (ref 2.0–4.0)
Glucose: 142 mg/dL — ABNORMAL HIGH (ref 74–99)
Potassium: 3.3 mmol/L — ABNORMAL LOW (ref 3.5–5.5)
Protein, total: 6.3 g/dL — ABNORMAL LOW (ref 6.4–8.2)
Sodium: 142 mmol/L (ref 136–145)

## 2016-12-20 MED ORDER — PANTOPRAZOLE 40 MG IV SOLR
40 mg | INTRAVENOUS | Status: DC
Start: 2016-12-20 — End: 2016-12-20

## 2016-12-20 MED ORDER — PANTOPRAZOLE 40 MG IV SOLR
40 mg | INTRAVENOUS | Status: AC
Start: 2016-12-20 — End: 2016-12-20
  Administered 2016-12-21: 01:00:00 via INTRAVENOUS

## 2016-12-20 MED ORDER — SODIUM CHLORIDE 0.9 % IV
INTRAVENOUS | Status: DC | PRN
Start: 2016-12-20 — End: 2016-12-21

## 2016-12-20 MED FILL — PROTONIX 40 MG INTRAVENOUS SOLUTION: 40 mg | INTRAVENOUS | Qty: 80

## 2016-12-20 MED FILL — SODIUM CHLORIDE 0.9 % IV: INTRAVENOUS | Qty: 250

## 2016-12-20 NOTE — ED Triage Notes (Signed)
Patient had labs drawn and doctors office called patient and told patient to come to the ED due to H&H being low.

## 2016-12-20 NOTE — Other (Signed)
2130  TRANSFER - IN REPORT:    [facility] on [name]  being received from ED(unit) for routine progression of care      Report consisted of patient???s Situation, Background, Assessment and   Recommendations(SBAR).     Information from the following report(s) SBAR, [facility], [facility] and Recent Results was reviewed with the receiving nurse.    Opportunity for questions and clarification was provided.      Assessment completed upon patient???s arrival to unit and care assumed.

## 2016-12-20 NOTE — H&P (Signed)
111980

## 2016-12-20 NOTE — Progress Notes (Signed)
PCCM:    Pt seen in ED. AOx3; stable; c/o mild dizziness with ambulation and is hungry and would like some water. Denies any SOB/DIB, CP, palpitations, abd pain, dysuria, H/A, F/C; blurry vision. States that she came to the ED because her PCP instructed her to do so after her routine lab work came back with low Hgb.     Orders placed. Discussed case with Dr. Zollie BeckersWalter.    Full consult note to follow.     Tonia BroomsAndrea M Carlisa Eble, PA-C  Pulmonary Critical Care Medicine  [facility]

## 2016-12-20 NOTE — Other (Signed)
TRANSFER - OUT REPORT:    Verbal report given to Kia, RN(name) on El SalvadorHelena Azam  being transferred to ICU(unit) for routine progression of care       Report consisted of patient???s Situation, Background, Assessment and   Recommendations(SBAR).     Information from the following report(s) SBAR, ED Summary, Intake/Output, MAR, Recent Results and Cardiac Rhythm NSR was reviewed with the receiving nurse.    Lines:       Opportunity for questions and clarification was provided.      Patient transported with:   Monitor  Registered Nurse   IV with blood transfusion

## 2016-12-20 NOTE — H&P (Signed)
MEDICAL CENTER  HISTORY AND PHYSICAL      Name:[name] Short, [name] Short  MR#: [id number]  DOB: 05-Sep-1955  ACCOUNT #: [id number]   ADMIT DATE: 12/20/2016    CHIEF COMPLAINT:  Sent to the ED for labs, noted to have low hemoglobin.    PRIMARY CARE PHYSICIAN:  Dr. Octavia Bruckneroger Dajao.    HISTORY OF PRESENT ILLNESS:  This is a 61 year old African-American female with a past medical history of iron deficiency anemia.  She underwent an EGD in 11/2015 for low hemoglobin.  This revealed a grade II erosive esophagitis.  She followed up as an outpatient for a colonoscopy, which apparently was unrevealing.  She was recommended to have a pill camera; however, she did not follow up and have this done.  She also has been noncompliant with her iron supplements.  She does take a multivitamin daily; however, she had not noted any blood in her stool, no melena.  She did note black and tarry stools, but she felt that this was related to her eating vegetables and to taking her multivitamin.  She had been noticing some progressively worsening dizziness.  She denies any shortness of breath at rest.  No dyspnea on exertion.  She did fall down some stairs at work, this was in 11/2016.  She went to see Dr. Andrena Mewsajao for routine followup last month, he gave her a script to get her labs drawn.  She did not follow up on this until this month.  She had her labs drawn in the ED, her hemoglobin was noted to be 3.1 and she was called back into the ED for further management where she has been noted by the ED attending to have brown stool, which is Hemoccult positive.  At my interview, she denies any fever, sweats, or chills.  No chest pain, no shortness of breath, no dyspnea on exertion.  No nausea, vomiting, no constipation, no diarrhea.  Again, she did note some dark stool.  She denies any blurred vision, no double vision, no headache, no rash, no focal weakness.  She states that she has had diabetes education.  Her daughter occasionally shops and cooks  or she does her own shopping and cooking.  In the ED, her iron studies are pending at the time of this dictation.  She has been ordered a unit of blood, and given her very low hemoglobin, she is triaged to the intensive care unit.    ALLERGIES:  NO KNOWN DRUG ALLERGIES.      PAST MEDICAL HISTORY:  1.  Iron deficiency anemia.  2.  Hypertension.  3.  Diabetes.  4.  Tobacco abuse.  5.  History of small brainstem stroke 2014, on Plavix.  6.  Health maintenance:  She states that she is up to date with her colonoscopy and her mammogram.  She is due for a Pap smear.    HOME MEDICATIONS:  Please note, she does not know all of the doses.  This list has been updated in the computer per the patient's report.  1.  Lipitor 40 mg p.o. at bedtime.  2.  Vitamin D3 1000 units p.o. daily.  3.  Plavix 75 mg p.o. daily.  4.  Bydureon subcutaneous every 7 days.  5.  Lasix 20 mg p.o. daily.  6.  NovoLog 70/30, 30 units subcutaneous every morning before breakfast and 25 units subcutaneous every evening before dinner.  7.  Lisinopril 5 mg p.o. daily.  8.  Metolazone 5 mg half a tab every Monday, Wednesday,  and Friday.  9.  Multivitamin 1 tab p.o. daily.  10.  Protonix 40 mg p.o. b.i.d.    SURGICAL HISTORY:  1.  C-section in 26.  2.  D and C in 1995.  3.  EGD, Dr. Reesa Chew, 11/26/2015.  4.  Colonoscopy in 2017.    SOCIAL HISTORY:  She continues to smoke.  She states she has cut back.  She denies alcohol or illicits.  She resides with her daughter at home.  She works in a clerical position.    FAMILY HISTORY:  Her parents with hypertension and heart disease.  Also, family history of diabetes.    PHYSICAL EXAMINATION:    VITAL SIGNS:  Temperature 99.5, blood pressure 108/58, pulse 88, respiratory rate 16, satting 100% on room air.  GENERAL:  She is awake, alert, and oriented x4.  Her daughter and another family member are at the bedside.  HEENT:  Normocephalic, atraumatic.  Pupils equally round and reactive to  light.  No scleral icterus.  She does have conjunctival pallor.    NECK:  Neck is supple.  Neck veins flat.  No cervical lymphadenopathy.  No carotid bruit.  CARDIOVASCULAR:  S1, S2, regular rate and rhythm.  No murmur.  LUNGS:  Clear to auscultation bilaterally.    ABDOMEN:  Slightly obese, soft, nontender, nondistended, normoactive bowel sounds.  EXTREMITIES:  No edema.  Dorsalis pedis pulses 2+ bilaterally.  NEUROLOGIC:  Cranial nerves II through XII grossly intact.  Moving all 4 extremities.  SKIN:  No rash, no lesion.    LABORATORY DATA:  WBC 4.4, H and H 3.1 and 11.3, MCV 64.2, MCH 17.6, platelets 291, neutrophils 67%.  Sodium 142, potassium 3.3, chloride 106, CO2 29, BUN 21, creatinine 0.68, calcium 10, ALT 23, AST 18, total bilirubin 0.2.    IMAGING:  None.    ASSESSMENT AND PLAN:  1.  Profound symptomatic anemia.  Iron studies are pending.  She may need IV iron.  She has been ordered a blood transfusion.  She is triaged to the intensive care unit.  She is on a Protonix drip.  Per GI doctor, okay to continue Plavix for now as no overt bleeding.  She may eat at the present time pending decision regarding scoping her during this admission.  2.  History of iron deficiency anemia.  Please follow her iron studies.  She may need IV iron.  We will start her on oral supplements.  3.  Hypertension.  She is relatively hypotensive.  We will hold her lisinopril and metolazone.  We will give Lasix with holding parameters as she will receive a blood transfusion.  4.  Diabetes type 2.  Her sugars are in the 100s, will hold her long-acting insulin for now.  Sliding scale insulin.  Check hemoglobin A1c.  Consult diabetes educator.  5.  Tobacco abuse.  Smoking cessation education.  6.  History of small brainstem stroke 2014, continue Plavix for now.  7.  Hypokalemia.  Repleted in the ED.  8.  Obesity.  9.  HEALTH MAINTENANCE:  She is up to date with her mammogram and colonoscopy.  She is due for her Pap smear.   10.  Avoid chemical DVT prophylaxis.    DISPOSITION:  SHE IS A FULL CODE.  Admit to medical ICU.    Time spent 70 minutes.      Sandrina Heaton       ES/DN  D: 12/20/2016 20:37     T: 12/20/2016 22:50  JOB #: [id number]  CC: Ezzard Standing MD  CC: Jules Husbands MD  CC: Eustace Moore MD

## 2016-12-20 NOTE — ED Provider Notes (Signed)
EMERGENCY DEPARTMENT HISTORY AND PHYSICAL EXAM    5:36 PM      Date: 12/20/2016  Patient Name: [name] Short    History of Presenting Illness     Chief Complaint   Patient presents with   ??? Abnormal Lab Results         History Provided By: Patient    Chief Complaint: Abnormal Lab Results   Duration:  Today   Timing:  Acute  Location: N/A  Quality: N/A  Severity: Patient denies having any pain.   Modifying Factors: States that when her sugars are low, she eats a peanut butter and jelly sandwich, and her sugar increases.  Notes that she eats a lot of ice. Patient reports non-compliance with taking her iron pill.  Associated Symptoms: Patient states that she has been feeling intermittent dizziness, and fatigue.      Additional History (Context): [name] Short is a 62 y.o. female with PMHx of DM and anemia who presents with abnormal lab results onset today. Patient states that her blood was drawn 1 day ago, and was called today to go to the ED because her Hemoglobin was low. Patient states that she has been feeling intermittent dizziness, and fatigue. Notes that she eats a lot of ice. Patient reports non-compliance with taking her iron pill. Reports prior hx of this presentation when she was here last for a colonoscopy. States that she presented with anemia when she was getting her colonoscopy. Patient was told to follow-up with GI for a Pill endoscopy, but states that she did not follow-up because she was feeling better. Patient also notes that she has been experiencing fluctuating sugars. State that her sugars have been high (200s), and then sometimes at night, her blood sugars would drop to as low as 37. States that when her sugars are low, she eats a peanut butter and jelly sandwich, and her sugar increases. Notes that she does check her sugars regularly. No other concerns were expressed at this time.      As the patient is without physical symptoms or complaints of pain, there  is no location of pain, severity of pain, quality of pain regarding the pt's presenting complaint.      PCP: Marissa Nestle, MD    Current Outpatient Prescriptions   Medication Sig Dispense Refill   ??? pantoprazole (PROTONIX) 40 mg tablet Take 1 Tab by mouth Before breakfast and dinner. 60 Tab 1   ??? furosemide (LASIX) 20 mg tablet Take  by mouth daily.     ??? EXENATIDE MICROSPHERES (BYDUREON SC) by SubCUTAneous route.     ??? cholecalciferol (VITAMIN D3) 1,000 unit tablet Take 1 tablet by mouth daily. 30 tablet 0   ??? insulin aspart protamine/insulin aspart (NOVOLOG MIX 70-30 FLEXPEN) 100 unit/mL (70-30) flex pen 30 units SQ every morning and 25 units SQ every evening 1 Package 0   ??? aspirin 81 mg chewable tablet Take 1 tablet by mouth daily. 30 tablet 2   ??? atorvastatin (LIPITOR) 40 mg tablet Take 1 tablet by mouth nightly. 30 tablet 0   ??? clopidogrel (PLAVIX) 75 mg tablet Take 1 tablet by mouth daily. 30 tablet 2   ??? lisinopril (PRINIVIL, ZESTRIL) 5 mg tablet Take 1 tablet by mouth daily. 30 tablet 0       Past History     Past Medical History:  Past Medical History:   Diagnosis Date   ??? Anemia    ??? Diabetes (HCC)  Past Surgical History:  Past Surgical History:   Procedure Laterality Date   ??? HX CESAREAN SECTION  1987   ??? HX DILATION AND CURETTAGE  1995       Family History:  Family History   Problem Relation Age of Onset   ??? Hypertension Other 35     parent,NOS   ??? Heart Disease Other 62     parent, NOS   ??? Diabetes Other 45     parent, NOS   ??? Cancer Father        Social History:  Social History   Substance Use Topics   ??? Smoking status: Current Every Day Smoker   ??? Smokeless tobacco: Not on file      Comment: patient state that she smokes 1 black&mild /daily   ??? Alcohol use No       Allergies:  No Known Allergies      Review of Systems     Review of Systems   Constitutional: Positive for fatigue. Negative for activity change and fever.   HENT: Negative for congestion and rhinorrhea.     Eyes: Negative for visual disturbance.   Respiratory: Negative for shortness of breath.    Cardiovascular: Negative for chest pain and palpitations.   Gastrointestinal: Negative for abdominal pain, diarrhea, nausea and vomiting.   Genitourinary: Negative for dysuria and hematuria.   Musculoskeletal: Negative for back pain.   Skin: Negative for rash.   Neurological: Positive for dizziness. Negative for weakness and light-headedness.   All other systems reviewed and are negative.        Physical Exam     Visit Vitals   ??? BP 108/58 (BP 1 Location: Left arm, BP Patient Position: At rest;Sitting)   ??? Pulse 88   ??? Temp 99.5 ??F (37.5 ??C)   ??? Resp 18   ??? Ht 4\' 11"  (1.499 m)   ??? Wt 81.6 kg (180 lb)   ??? SpO2 100%   ??? BMI 36.36 kg/m2     Physical Exam   Constitutional: She is oriented to person, place, and time. She appears well-developed and well-nourished. No distress.   HENT:   Head: Normocephalic and atraumatic.   Right Ear: External ear normal.   Left Ear: External ear normal.   Nose: Nose normal.   Mouth/Throat: Oropharynx is clear and moist.   Eyes: Conjunctivae and EOM are normal. Pupils are equal, round, and reactive to light. No scleral icterus.   Neck: Normal range of motion. Neck supple. No JVD present. No tracheal deviation present. No thyromegaly present.   Cardiovascular: Normal rate, regular rhythm, normal heart sounds and intact distal pulses.  Exam reveals no gallop and no friction rub.    No murmur heard.  Pulmonary/Chest: Effort normal and breath sounds normal. She exhibits no tenderness.   Abdominal: Soft. Bowel sounds are normal. She exhibits no distension. There is no tenderness. There is no rebound and no guarding.   Genitourinary:   Genitourinary Comments: Rectal Exam:  Patient has brown stool  Heme positive    Musculoskeletal: Normal range of motion. She exhibits no edema or tenderness.   Lymphadenopathy:     She has no cervical adenopathy.    Neurological: She is alert and oriented to person, place, and time. No cranial nerve deficit. Coordination normal.   No sensory loss, Gait normal, Motor 5/5   Skin: Skin is warm and dry.   Psychiatric: She has a normal mood and affect. Her behavior is normal. Judgment and  thought content normal.   Nursing note and vitals reviewed.        Diagnostic Study Results     Labs -  No results found for this or any previous visit (from the past 12 hour(s)).    Radiologic Studies -   No orders to display         Medical Decision Making   I am the first provider for this patient.    I reviewed the vital signs, available nursing notes, past medical history, past surgical history, family history and social history.    Vital Signs-Reviewed the patient's vital signs.    Pulse Oximetry Analysis -  100% on room air (Interpretation)    Records Reviewed: Nursing Notes (Time of Review: 5:36 PM)    ED Course: Progress Notes, Reevaluation, and Consults:  Consult:  Discussed care with Dr. Loretha Stapler, Hospitalist Standard discussion; including history of patient???s chief complaint, available diagnostic results, and treatment course. Accepts the patient for admission to the ICU  7:39 PM    Consult:  Discussed care with Dr. Zollie Beckers, ICU Standard discussion; including history of patient???s chief complaint, available diagnostic results, and treatment course. Will admit the patient to the ICU.   7:43 PM    Provider Notes (Medical Decision Making):  Pt is a 61yo female with a hx of GI bleeding and anemia presents after being found to have low hgb by PMD. Pt is symptomatic but in no true distress with Hgb 3.1.  Will transfuse based on guidelines and admit.  Pt is heme positive but suspect chronic bleeding without hemorrhage.      Procedures: None     Core Measures: None    CRITICAL CARE:  7:45 PM  I have spent 60 minutes of critical care time involved in lab review, consultations with specialist, family decision-making, and documentation.   During this entire length of time I was immediately available to the patient.    Critical Care:  The reason for providing this level of medical care for this critically ill patient was due a critical illness that impaired one or more vital organ systems such that there was a high probability of imminent or life threatening deterioration in the patients condition. This care involved high complexity decision making to assess, manipulate, and support vital system functions, to treat this degreee vital organ system failure and to prevent further life threatening deterioration of the patient???s condition.     For Hospitalized Patients:    1. Hospitalization Decision Time:  The decision to hospitalize the patient was made by Dr. Janey Greaser at 19:39PM on 12/20/2016    2. Aspirin: Aspirin was not given because the patient is a bleeding risk    Diagnosis     Clinical Impression: No diagnosis found.    Disposition: Admission to ICU     Follow-up Information     None           Patient's Medications   Start Taking    No medications on file   Continue Taking    ASPIRIN 81 MG CHEWABLE TABLET    Take 1 tablet by mouth daily.    ATORVASTATIN (LIPITOR) 40 MG TABLET    Take 1 tablet by mouth nightly.    CHOLECALCIFEROL (VITAMIN D3) 1,000 UNIT TABLET    Take 1 tablet by mouth daily.    CLOPIDOGREL (PLAVIX) 75 MG TABLET    Take 1 tablet by mouth daily.    EXENATIDE MICROSPHERES (BYDUREON SC)    by SubCUTAneous route.  FUROSEMIDE (LASIX) 20 MG TABLET    Take  by mouth daily.    INSULIN ASPART PROTAMINE/INSULIN ASPART (NOVOLOG MIX 70-30 FLEXPEN) 100 UNIT/ML (70-30) FLEX PEN    30 units SQ every morning and 25 units SQ every evening    LISINOPRIL (PRINIVIL, ZESTRIL) 5 MG TABLET    Take 1 tablet by mouth daily.    PANTOPRAZOLE (PROTONIX) 40 MG TABLET    Take 1 Tab by mouth Before breakfast and dinner.   These Medications have changed    No medications on file   Stop Taking    No medications on file     _______________________________     Attestations:  Scribe Attestation     Clelia Croft acting as a scribe for and in the presence of Alver Sorrow, MD      Dec 20, 2016 at 5:36 PM       Provider Attestation:      I personally performed the services described in the documentation, reviewed the documentation, as recorded by the scribe in my presence, and it accurately and completely records my words and actions. Dec 20, 2016 at 5:36 PM - Alver Sorrow, MD    _______________________________  '

## 2016-12-20 NOTE — ED Notes (Cosign Needed)
I performed a brief evaluation, including history and physical, of the patient here in triage and I have determined that pt will need further treatment and evaluation from the main side ER physician.  I have placed initial orders to help in expediting patients care.     Dec 20, 2016 at 4:15 PM - Mechele CollinAshley N. Dantre Yearwood, PA-C        Visit Vitals   ??? BP 108/58 (BP 1 Location: Left arm, BP Patient Position: At rest;Sitting)   ??? Pulse 88   ??? Temp 99.5 ??F (37.5 ??C)   ??? Resp 18   ??? Ht 4\' 11"  (1.499 m)   ??? Wt 81.6 kg (180 lb)   ??? SpO2 100%   ??? BMI 36.36 kg/m2

## 2016-12-21 LAB — GLUCOSE, POC
Glucose (POC): 123 mg/dL — ABNORMAL HIGH (ref 70–110)
Glucose (POC): 107 mg/dL (ref 70–110)
Glucose (POC): 116 mg/dL — ABNORMAL HIGH (ref 70–110)
Glucose (POC): 305 mg/dL — ABNORMAL HIGH (ref 70–110)

## 2016-12-21 LAB — CBC WITH AUTOMATED DIFF
ABS. BASOPHILS: 0 10*3/uL (ref 0.0–0.1)
ABS. EOSINOPHILS: 0.1 10*3/uL (ref 0.0–0.4)
ABS. LYMPHOCYTES: 0.7 10*3/uL — ABNORMAL LOW (ref 0.9–3.6)
ABS. MONOCYTES: 0.5 10*3/uL (ref 0.05–1.2)
ABS. NEUTROPHILS: 4.7 10*3/uL (ref 1.8–8.0)
BASOPHILS: 0 % (ref 0–2)
EOSINOPHILS: 2 % (ref 0–5)
HCT: 26.7 % — ABNORMAL LOW (ref 35.0–45.0)
HGB: 8.6 g/dL — ABNORMAL LOW (ref 12.0–16.0)
LYMPHOCYTES: 12 % — ABNORMAL LOW (ref 21–52)
MCH: 24.2 PG (ref 24.0–34.0)
MCHC: 32.2 g/dL (ref 31.0–37.0)
MCV: 75.2 FL (ref 74.0–97.0)
MONOCYTES: 9 % (ref 3–10)
MPV: 9.3 FL (ref 9.2–11.8)
NEUTROPHILS: 77 % — ABNORMAL HIGH (ref 40–73)
PLATELET COMMENTS: ADEQUATE
PLATELET: 275 10*3/uL (ref 135–420)
RBC: 3.55 M/uL — ABNORMAL LOW (ref 4.20–5.30)
RDW: 23.2 % — ABNORMAL HIGH (ref 11.6–14.5)
WBC: 6 10*3/uL (ref 4.6–13.2)

## 2016-12-21 LAB — HEMOGLOBIN A1C WITH EAG
Est. average glucose: 100 mg/dL
Hemoglobin A1c: 5.1 % (ref 4.2–5.6)

## 2016-12-21 LAB — HGB & HCT
HCT: 30.9 % — ABNORMAL LOW (ref 35.0–45.0)
HGB: 9.7 g/dL — ABNORMAL LOW (ref 12.0–16.0)

## 2016-12-21 LAB — MRSA SCREEN - PCR (NASAL)

## 2016-12-21 LAB — CALCIUM, IONIZED: Ionized Calcium: 1.27 MMOL/L (ref 1.12–1.32)

## 2016-12-21 LAB — METABOLIC PANEL, BASIC
Anion gap: 5 mmol/L (ref 3.0–18)
BUN/Creatinine ratio: 22 — ABNORMAL HIGH (ref 12–20)
BUN: 14 MG/DL (ref 7.0–18)
CO2: 28 mmol/L (ref 21–32)
Calcium: 9.7 MG/DL (ref 8.5–10.1)
Chloride: 110 mmol/L — ABNORMAL HIGH (ref 100–108)
Creatinine: 0.63 MG/DL (ref 0.6–1.3)
GFR est AA: >60 mL/min/{1.73_m2} (ref >60)
GFR est non-AA: >60 mL/min/{1.73_m2} (ref >60)
Glucose: 104 mg/dL — ABNORMAL HIGH (ref 74–99)
Potassium: 3.7 mmol/L (ref 3.5–5.5)
Sodium: 143 mmol/L (ref 136–145)

## 2016-12-21 LAB — PHOSPHORUS: Phosphorus: 2.5 MG/DL (ref 2.5–4.9)

## 2016-12-21 LAB — DIRECT COOMBS
Anti-Complement (C3B,C3D): NEGATIVE
DAT C3b/C3d: NEGATIVE
DAT IgG: NEGATIVE
DAT Poly: NEGATIVE
Dat, Anti-IgG Coombs: NEGATIVE
Direct Coombs Test: NEGATIVE

## 2016-12-21 LAB — IRON PROFILE
Iron % saturation: 3 %
Iron: 17 ug/dL — ABNORMAL LOW (ref 50–175)
TIBC: 495 ug/dL — ABNORMAL HIGH (ref 250–450)

## 2016-12-21 LAB — PROTHROMBIN TIME + INR
INR: 1.2 (ref 0.8–1.2)
Prothrombin time: 14.2 s (ref 11.5–15.2)

## 2016-12-21 LAB — VITAMIN B12 & FOLATE
Folate: 17.9 ng/mL — ABNORMAL HIGH (ref 3.10–17.50)
Vitamin B12: 603 pg/mL (ref 211–911)

## 2016-12-21 LAB — MAGNESIUM: Magnesium: 1.9 mg/dL (ref 1.6–2.6)

## 2016-12-21 LAB — FERRITIN: Ferritin: 7 NG/ML — ABNORMAL LOW (ref 8–388)

## 2016-12-21 LAB — PTT: aPTT: 27.5 s (ref 23.0–36.4)

## 2016-12-21 MED ORDER — DEXTROSE 50% IN WATER (D50W) IV SYRG
INTRAVENOUS | Status: DC | PRN
Start: 2016-12-21 — End: 2016-12-21

## 2016-12-21 MED ORDER — FERROUS SULFATE 325 MG (65 MG ELEMENTAL IRON) TAB
325 mg (65 mg iron) | ORAL_TABLET | Freq: Two times a day (BID) | ORAL | 0 refills | Status: AC
Start: 2016-12-21 — End: ?

## 2016-12-21 MED ORDER — SODIUM CHLORIDE 0.9 % IJ SYRG
INTRAMUSCULAR | Status: DC | PRN
Start: 2016-12-21 — End: 2016-12-21

## 2016-12-21 MED ORDER — FERROUS SULFATE 325 MG (65 MG ELEMENTAL IRON) TAB
325 mg (65 mg iron) | Freq: Two times a day (BID) | ORAL | Status: DC
Start: 2016-12-21 — End: 2016-12-21
  Administered 2016-12-21 (×2): via ORAL

## 2016-12-21 MED ORDER — PANTOPRAZOLE 40 MG TAB, DELAYED RELEASE
40 mg | ORAL_TABLET | Freq: Every day | ORAL | 0 refills | Status: AC
Start: 2016-12-21 — End: ?

## 2016-12-21 MED ORDER — ONDANSETRON (PF) 4 MG/2 ML INJECTION
4 mg/2 mL | Freq: Four times a day (QID) | INTRAMUSCULAR | Status: DC | PRN
Start: 2016-12-21 — End: 2016-12-21

## 2016-12-21 MED ORDER — IRON SUCROSE 100 MG/5 ML IV SOLN
100 mg iron/5 mL | Freq: Once | INTRAVENOUS | Status: AC
Start: 2016-12-21 — End: 2016-12-21
  Administered 2016-12-21: 16:00:00 via INTRAVENOUS

## 2016-12-21 MED ORDER — SODIUM CHLORIDE 0.9 % INJECTION
40 mg | Freq: Every day | INTRAMUSCULAR | Status: DC
Start: 2016-12-21 — End: 2016-12-21
  Administered 2016-12-21: 13:00:00 via INTRAVENOUS

## 2016-12-21 MED ORDER — THERAPEUTIC MULTIVITAMIN TAB
Freq: Every day | ORAL | Status: DC
Start: 2016-12-21 — End: 2016-12-21
  Administered 2016-12-21: 14:00:00 via ORAL

## 2016-12-21 MED ORDER — FUROSEMIDE 20 MG TAB
20 mg | Freq: Every day | ORAL | Status: DC
Start: 2016-12-21 — End: 2016-12-21
  Administered 2016-12-21: 14:00:00 via ORAL

## 2016-12-21 MED ORDER — SODIUM CHLORIDE 0.9 % IJ SYRG
Freq: Three times a day (TID) | INTRAMUSCULAR | Status: DC
Start: 2016-12-21 — End: 2016-12-21
  Administered 2016-12-21 (×3): via INTRAVENOUS

## 2016-12-21 MED ORDER — GLUCOSE 4 GRAM CHEWABLE TAB
4 gram | ORAL | Status: DC | PRN
Start: 2016-12-21 — End: 2016-12-21

## 2016-12-21 MED ORDER — GLUCAGON 1 MG INJECTION
1 mg | INTRAMUSCULAR | Status: DC | PRN
Start: 2016-12-21 — End: 2016-12-21

## 2016-12-21 MED ORDER — CLOPIDOGREL 75 MG TAB
75 mg | Freq: Every day | ORAL | Status: DC
Start: 2016-12-21 — End: 2016-12-20

## 2016-12-21 MED ORDER — ACETAMINOPHEN 500 MG TAB
500 mg | Freq: Four times a day (QID) | ORAL | Status: DC | PRN
Start: 2016-12-21 — End: 2016-12-21

## 2016-12-21 MED ORDER — SODIUM CHLORIDE 0.9 % IV
INTRAVENOUS | Status: DC | PRN
Start: 2016-12-21 — End: 2016-12-21

## 2016-12-21 MED ORDER — ASCORBIC ACID 250 MG TAB
250 mg | ORAL_TABLET | Freq: Two times a day (BID) | ORAL | 0 refills | Status: AC
Start: 2016-12-21 — End: ?

## 2016-12-21 MED ORDER — ATORVASTATIN 40 MG TAB
40 mg | Freq: Every evening | ORAL | Status: DC
Start: 2016-12-21 — End: 2016-12-21
  Administered 2016-12-21: 03:00:00 via ORAL

## 2016-12-21 MED ORDER — ASCORBIC ACID 250 MG TAB
250 mg | Freq: Two times a day (BID) | ORAL | Status: DC
Start: 2016-12-21 — End: 2016-12-21
  Administered 2016-12-21 (×2): via ORAL

## 2016-12-21 MED ORDER — SODIUM CHLORIDE 0.9 % INJECTION
40 mg | Freq: Every day | INTRAMUSCULAR | Status: DC
Start: 2016-12-21 — End: 2016-12-21

## 2016-12-21 MED ORDER — POTASSIUM CHLORIDE SR 20 MEQ TAB, PARTICLES/CRYSTALS
20 mEq | ORAL | Status: AC
Start: 2016-12-21 — End: 2016-12-20
  Administered 2016-12-21: 03:00:00 via ORAL

## 2016-12-21 MED ORDER — INSULIN LISPRO 100 UNIT/ML INJECTION
100 unit/mL | Freq: Four times a day (QID) | SUBCUTANEOUS | Status: DC
Start: 2016-12-21 — End: 2016-12-21
  Administered 2016-12-21: 22:00:00 via SUBCUTANEOUS

## 2016-12-21 MED ORDER — INSULIN LISPRO 100 UNIT/ML INJECTION
100 unit/mL | Freq: Four times a day (QID) | SUBCUTANEOUS | Status: DC
Start: 2016-12-21 — End: 2016-12-20

## 2016-12-21 MED FILL — POTASSIUM CHLORIDE SR 20 MEQ TAB, PARTICLES/CRYSTALS: 20 mEq | ORAL | Qty: 2

## 2016-12-21 MED FILL — SODIUM CHLORIDE 0.9 % IV: INTRAVENOUS | Qty: 250

## 2016-12-21 MED FILL — INSULIN LISPRO 100 UNIT/ML INJECTION: 100 unit/mL | SUBCUTANEOUS | Qty: 1

## 2016-12-21 MED FILL — VITAMIN C 250 MG TABLET: 250 mg | ORAL | Qty: 1

## 2016-12-21 MED FILL — FERROUS SULFATE 325 MG (65 MG ELEMENTAL IRON) TAB: 325 mg (65 mg iron) | ORAL | Qty: 1

## 2016-12-21 MED FILL — FUROSEMIDE 20 MG TAB: 20 mg | ORAL | Qty: 1

## 2016-12-21 MED FILL — BD POSIFLUSH NORMAL SALINE 0.9 % INJECTION SYRINGE: INTRAMUSCULAR | Qty: 10

## 2016-12-21 MED FILL — VENOFER 100 MG IRON/5 ML INTRAVENOUS SOLUTION: 100 mg iron/5 mL | INTRAVENOUS | Qty: 25

## 2016-12-21 MED FILL — PROTONIX 40 MG INTRAVENOUS SOLUTION: 40 mg | INTRAVENOUS | Qty: 40

## 2016-12-21 MED FILL — ATORVASTATIN 40 MG TAB: 40 mg | ORAL | Qty: 1

## 2016-12-21 MED FILL — THERAPEUTIC MULTIVITAMIN TAB: ORAL | Qty: 1

## 2016-12-21 NOTE — Consults (Signed)
[email or web address]  [phone]    GASTROENTEROLOGY CONSULT      Impression:   1. Severe recurrent Fe def anemia with occult GI bleeding without overt GI blood loss in the setting of plavix use-s/p transfusion. Has had EGD/Colo in 2017-pillcam was recommended, but patient did not get it done. Hgb 8.6 after transfusion. Also non compliant on plavix.   2. Plavix use due to CVA  3. GERD: h/o erosive esophagitis in 2017      Plan:     1. Resume diet.  2. Will repeat EGD and Colonoscopy as outpatient (no evidence of overt bleeding, and will need clearance to stop plavix x 7 days)-given that it is > 1 year, reasonable to repeat this to make sure there are no changes. If those are negative, will need pill cam. No plans for inpatient scopes unless there is active, ongoing bleeding.   3. IV iron dose before discharge.   4. PPI Q day.     She will need to come in for a follow up visit to get these scheduled. She can call [phone] to get this set up.     Chief Complaint: anemia      HPI:  [name] Short is a 61 y.o. female who I am being asked to see in consultation for an opinion regarding above. She has severe Fe def anemia-she was seen in 2017. She had an EGD in 11/2015-erosive esophagitis. Colo in 02/2016-HP polyp in descending colon. She was supposed to have pillcam done, but states she was busy and forgot about it.     She had routine labs done this week and was noted to have a Hgb of 3.1, MCV 64.2, Ferritin 7. No overt bleeding, but stool was heme positive. No other GI symptoms. She did have pagophagia.    PMH:   Past Medical History:   Diagnosis Date   ??? Anemia    ??? Diabetes (HCC)        PSH:   Past Surgical History:   Procedure Laterality Date   ??? HX CESAREAN SECTION  1987   ??? HX DILATION AND CURETTAGE  1995       Social HX:   Social History     Social History   ??? Marital status: SINGLE     Spouse name: N/A   ??? Number of children: N/A   ??? Years of education: N/A     Occupational History   ??? Not on file.      Social History Main Topics   ??? Smoking status: Current Every Day Smoker   ??? Smokeless tobacco: Not on file      Comment: patient state that she smokes 1 black&mild /daily   ??? Alcohol use No   ??? Drug use: No   ??? Sexual activity: No     Other Topics Concern   ??? Not on file     Social History Narrative       FHX:   Family History   Problem Relation Age of Onset   ??? Hypertension Other 35     parent,NOS   ??? Heart Disease Other 62     parent, NOS   ??? Diabetes Other 45     parent, NOS   ??? Cancer Father        Allergy:   No Known Allergies    Patient Active Problem List   Diagnosis Code   ??? Diabetes (HCC) E11.9   ??? Anemia D64.9   ??? CVA (cerebral vascular  accident) (HCC) I63.9   ??? Stroke (HCC) I63.9   ??? Symptomatic anemia D64.9       Home Medications:     Prescriptions Prior to Admission   Medication Sig   ??? multivitamin (ONE A DAY) tablet Take 1 Tab by mouth daily.   ??? metOLazone (ZAROXOLYN) 5 mg tablet Take 2.5 mg by mouth Three (3) times a week.   ??? EXENATIDE MICROSPHERES (BYDUREON SC) by SubCUTAneous route every seven (7) days.   ??? insulin aspart protamine/insulin aspart (NOVOLOG MIX 70-30 FLEXPEN) 100 unit/mL (70-30) flex pen 30 units SQ every morning and 25 units SQ every evening   ??? pantoprazole (PROTONIX) 40 mg tablet Take 1 Tab by mouth Before breakfast and dinner.   ??? furosemide (LASIX) 20 mg tablet Take 20 mg by mouth daily.   ??? cholecalciferol (VITAMIN D3) 1,000 unit tablet Take 1 tablet by mouth daily.   ??? atorvastatin (LIPITOR) 40 mg tablet Take 1 tablet by mouth nightly.   ??? clopidogrel (PLAVIX) 75 mg tablet Take 1 tablet by mouth daily.   ??? lisinopril (PRINIVIL, ZESTRIL) 5 mg tablet Take 1 tablet by mouth daily.       Review of Systems:     Constitutional: No fevers, chills, weight loss, fatigue.   Skin: No rashes, pruritis, jaundice, ulcerations, erythema.   HENT: No headaches, nosebleeds, sinus pressure, rhinorrhea, sore throat.   Eyes: No visual changes, blurred vision, eye pain, photophobia, jaundice.    Cardiovascular: No chest pain, heart palpitations.   Respiratory: No cough, SOB, wheezing, chest discomfort, orthopnea.   Gastrointestinal:    Genitourinary: No dysuria, bleeding, discharge, pyuria.   Musculoskeletal: No weakness, arthralgias, wasting.   Endo: No sweats.   Heme: No bruising, easy bleeding.   Allergies: As noted.   Neurological: Cranial nerves intact.  Alert and oriented. Gait not assessed.   Psychiatric:  No anxiety, depression, hallucinations.          Visit Vitals   ??? BP 150/57   ??? Pulse (!) 54   ??? Temp 98.1 ??F (36.7 ??C)   ??? Resp 12   ??? Ht 4\' 11"  (1.499 m)   ??? Wt 60.1 kg (132 lb 7.9 oz)   ??? SpO2 100%   ??? BMI 26.76 kg/m2       Physical Assessment:     constitutional: appearance: well developed, well nourished, normal habitus, no deformities, in no acute distress.   skin: inspection: no rashes, ulcers, icterus or other lesions; no clubbing or telangiectasias. palpation: no induration or subcutaneos nodules.   eyes: inspection: normal conjunctivae and lids; no jaundice pupils: normal  ENMT: mouth: normal oral mucosa,lips and gums; good dentition. oropharynx: normal tongue, hard and soft palate; posterior pharynx without erithema, exudate or lesions.   neck: thyroid: normal size, consistency and position; no masses or tenderness.   respiratory: effort: normal chest excursion; no intercostal retraction or accessory muscle use.   cardiovascular: abdominal aorta: normal size and position; no bruits. palpation: PMI of normal size and position; normal rhythm; no thrill or murmurs.   abdominal: abdomen: normal consistency; no tenderness or masses. hernias: no hernias appreciated. liver: normal size and consistency. spleen: not palpable.   rectal: hemoccult/guaiac: not performed.   musculoskeletal: digits and nails: no clubbing, cyanosis, petechiae or other inflammatory conditions. gait: normal gait and station head and neck: normal range of motion; no pain, crepitation or contracture.  spine/ribs/pelvis: normal range of motion; no pain, deformity or contracture.   neurologic: cranial nerves: II-XII normal.  psychiatric: judgement/insight: within normal limits. memory: within normal limits for recent and remote events. mood and affect: no evidence of depression, anxiety or agitation. orientation: oriented to time, space and person.        Basic Metabolic Profile   Recent Labs      12/21/16   0557   NA  143   K  3.7   CL  110*   CO2  28   BUN  14   GLU  104*   CA  9.7   MG  1.9   PHOS  2.5         CBC w/Diff    Recent Labs      12/21/16   0557   WBC  6.0   RBC  3.55*   HGB  8.6*   HCT  26.7*   MCV  75.2   MCH  24.2   MCHC  32.2   RDW  23.2*   PLT  275    Recent Labs      12/21/16   0557   GRANS  77*   LYMPH  12*   EOS  2        Hepatic Function   Recent Labs      12/20/16   1717   ALB  3.2*   TP  6.3*   TBILI  0.2   SGOT  18   AP  85        Coags   Recent Labs      12/21/16   0557   PTP  14.2   INR  1.2   APTT  27.5           Eustace Moore, MD.   [facility] [place]  [phone]  Cell: [phone]  [email or web address]  [email or web address]

## 2016-12-21 NOTE — Consults (Signed)
 Consults  by Dorothe Gaster, PA-C at 12/21/16 1610                Author: Dorothe Gaster, PA-C  Service: Pulmonary Disease  Author Type: Physician Assistant       Filed: 12/21/16 0635  Date of Service: 12/21/16 0615  Status: Signed           Editor: [place] At[place] Leellen Puller, PA-C (Physician Assistant)  Cosigner: Colman Deans at 12/22/16 9604            Consult Orders        1. IP CONSULT TO INTENSIVIST [[id number]] ordered by Cris Dollar, MD at 12/20/16 2012                              [facility]   Pulmonary, Critical Care, and Sleep Medicine              Name:  [name] Short  MRN:  [id number]          DOB:  [birth year]  Hospital:  [facility]     Date:  12/21/2016                Critical Care Initial Patient Consult      Requesting MD:  Felipe Horton                                                 Reason for CC Consult: Severe Anemia        IMPRESSION:         Asymptomatic Severe Anemia - Likely Iron  deficient; Initial  Hgb in ED - 3.1. Transfusing 4u PRBC's.     Mild Hypokalemia - Now resolved     Hx of CVA, DM II, Fe++ Deficient Anemia - Non-complaint with Fe++ replacement               RECOMMENDATIONS:         Resp - Comfortable on RA; titrate supp O2 PRN for SpO2 > 94%; aspiration precautions  - HOB >30'; encourage ICS; bronchial hygiene.      ID - Afebrile; aleukocytosis; low suspicion for infection; monitor s/s for infection. No ABX at this time.     CVS - HD stable; MAP >65; con't Lasix  (home med) BID.     Heme/Onc- Transfusing 1U PRBC's now; ordered for 3 more units to be transfused STAT; Iron  is low; check Haptoglobin; PT/PTT/INR; direct coombs.  Heme occult (+) in ED; monitor for any sign of active bleeding. Hold Plavix . Start on Iron  replacement therapy     Metabolic - Daily BMP; check Mg & Ionized Ca++ in AM; monitor e-lytes; replace PRN      Renal - Trend renal indices; Cr stable; no active issues     Endocrine - Glycemic control goal <180; SSI; POC glucose q6  while NPO     Neuro/ Pain/ Sedation - No active issues     GI - Keep NPO overnight for any possible procedures in AM; Consult GI in AM; Endoscopy (11/29/15) with Erosive Esophagitis, (-) bleed at that time. Con't daily Protonix.      Prophylaxis - DVT (SCD's) , GI (Protonix)          Subjective/History:        This patient  has been seen and evaluated at the request of Dr. Felipe Horton for Severe Anemia, Asymptomatic.  Patient is a  61 y.o. female with PMH of CVA, DM II, and Iron  Deficiency anemia, non-compliant with taking  Iron  pills, who presented to [facility] ED on 12/20/16 after being advised to go to ED by her PCP when her routine lab work revealed she had "low blood count". Pt is otherwise largely asymptomatic. She does note that she has more fatigue lately, and that she  eats a lot of ice; pt admits to being non-compliant with taking her Iron  pills as prescribed. She also notes that her blood sugars have been fluctuating lately, and that she checks her sugars regularly. ED w/o revealed Hgb of 3.1; began transfusing 1u  PRBC's in ED; transferred to ICU for further management.        Of note, Heme occult stool was (+) in ED; pt had seen GI in the past, and had an EGD last year (11/2015) with Dr. Clydene Darner that showed erosive Esophagitis, but no acute bleed. She was apparently supposed to f/u with GI for a Pill Endoscopy, but states  that she felt better so she did not follow up.       Upon initial evaluation in ED, pt is AOx3, NAD and resting comfortably in bed on RA. Pt admits to some dizziness with ambulation, but otherwise denies any SOB/DIB, cough, congestion, CP, Palpitations, abd pain, N/V/D; H/A; F/C; dysuria, dark tarry stool.  Denies any recent sick contacts.         Past Medical History:        Diagnosis  Date         ?  Anemia           ?  Diabetes [facility])             Past Surgical History:         Procedure  Laterality  Date          ?  HX CESAREAN SECTION    1987          ?  HX DILATION AND CURETTAGE    1995            Prior to Admission medications             Medication  Sig  Start Date  End Date  Taking?  Authorizing Provider            multivitamin (ONE A DAY) tablet  Take 1 Tab by mouth daily.      Yes  Historical Provider     metOLazone (ZAROXOLYN) 5 mg tablet  Take 2.5 mg by mouth Three (3) times a week.      Yes  Historical Provider     pantoprazole (PROTONIX) 40 mg tablet  Take 1 Tab by mouth Before breakfast and dinner.  11/27/15      Loistine Rinne, MD     furosemide  (LASIX ) 20 mg tablet  Take 20 mg by mouth daily.        Historical Provider     EXENATIDE MICROSPHERES (BYDUREON SC)  by SubCUTAneous route every seven (7) days.        Historical Provider     cholecalciferol (VITAMIN D3) 1,000 unit tablet  Take 1 tablet by mouth daily.  04/25/13      Halbert Levee, MD     insulin  aspart protamine/insulin  aspart (NOVOLOG MIX 70-30 FLEXPEN) 100 unit/mL (70-30) flex pen  30 units SQ every  morning and 25 units SQ every evening  04/25/13      Halbert Levee, MD     atorvastatin  (LIPITOR ) 40 mg tablet  Take 1 tablet by mouth nightly.  04/25/13      Halbert Levee, MD     clopidogrel  (PLAVIX ) 75 mg tablet  Take 1 tablet by mouth daily.  04/25/13      Halbert Levee, MD            lisinopril  (PRINIVIL , ZESTRIL ) 5 mg tablet  Take 1 tablet by mouth daily.  04/26/13      Halbert Levee, MD          Current Facility-Administered Medications          Medication  Dose  Route  Frequency           ?  atorvastatin  (LIPITOR ) tablet 40 mg   40 mg  Oral  QHS     ?  furosemide  (LASIX ) tablet 20 mg   20 mg  Oral  DAILY     ?  therapeutic multivitamin (THERAGRAN) tablet 1 Tab   1 Tab  Oral  DAILY     ?  sodium chloride  (NS) flush 5-10 mL   5-10 mL  IntraVENous  Q8H     ?  ferrous sulfate  tablet 325 mg   1 Tab  Oral  BID WITH MEALS     ?  ascorbic acid (vitamin C) (VITAMIN C) tablet 250 mg   250 mg  Oral  BID WITH MEALS     ?  pantoprazole (PROTONIX) 40 mg in sodium chloride  0.9% 10 mL injection   40 mg  IntraVENous  DAILY           ?  insulin   lispro (HUMALOG ) injection     SubCUTAneous  Q6H        No Known Allergies      Social History       Substance Use Topics         ?  Smoking status:  Current Every Day Smoker     ?  Smokeless tobacco:  Not on file                Comment: patient state that she smokes 1 black&mild /daily         ?  Alcohol use  No           Family History         Problem  Relation  Age of Onset          ?  Hypertension  Other  35             parent,NOS          ?  Heart Disease  Other  62             parent, NOS          ?  Diabetes  Other  45             parent, NOS          ?  Cancer  Father              Review of Systems:   Pertinent items are noted in HPI.        Objective:     Vital Signs:     Visit Vitals      ?  BP  129/56 (BP 1 Location: Left arm, BP Patient Position: At rest)      ?  Pulse  (!) 55      ?  Temp  98.2 F (36.8 C)      ?  Resp  13      ?  Ht  4\' 11"  (1.499 m)      ?  Wt  60.1 kg (132 lb 7.9 oz)      ?  SpO2  100%      ?  BMI  26.76 kg/m2                O2 Device: Room air     O2 Flow Rate (L/min): 2 l/min     Temp (24hrs), Avg:97.8 F (36.6 C), Min:97 F (36.1 C), Max:99.5 F (37.5 C)           Intake/Output:    Last shift:      05/19 1901 - 05/20 0700   In: 1240    Out: -    Last 3 shifts:        Intake/Output Summary (Last 24 hours) at 12/21/16 0615  Last data filed at 12/21/16 0455        Gross per 24 hour     Intake              1240 ml     Output                 0 ml     Net              1240 ml           Physical Exam:         General:   Alert, cooperative, NAD, appears stated age.        Head:   Normocephalic, without obvious abnormality, atraumatic.     Eyes:   Conjunctivae/corneas clear, pale. PERRL, EOMs intact.        Nose:  Nares normal. Septum midline. Mucosa normal, pale. No drainage or sinus tenderness.        Throat:  Lips, mucosa, and tongue pale. Teeth and gums normal.        Neck:  Supple, symmetrical, trachea midline, No JVD.     Back:    Symmetric, no curvature. ROM normal.     Lungs:     Symmetrical chest rise; good AE bilat; CTAB     Heart:   RRR, S1, S2 normal, no m/r/g     Abdomen:    Soft, non-tender. Bowel sounds normal. No masses,  No organomegaly.     Extremities:  Extremities normal, atraumatic, no cyanosis or edema.     Pulses:  2+ and symmetric all extremities.     Skin:  Skin color, texture, turgor normal. No rashes or lesions     Neurologic:  Grossly nonfocal             Data:          Recent Results (from the past 24 hour(s))     CBC WITH AUTOMATED DIFF          Collection Time: 12/20/16  5:17 PM         Result  Value  Ref Range            WBC  4.4 (L)  4.6 - 13.2 K/uL       RBC  1.76 (L)  4.20 - 5.30 M/uL       HGB  3.1 (LL)  12.0 - 16.0 g/dL  HCT  11.3 (LL)  35.0 - 45.0 %       MCV  64.2 (L)  74.0 - 97.0 FL       MCH  17.6 (L)  24.0 - 34.0 PG       MCHC  27.4 (L)  31.0 - 37.0 g/dL       RDW  45.4 (H)  09.8 - 14.5 %       PLATELET  291  135 - 420 K/uL       MPV  8.6 (L)  9.2 - 11.8 FL       NEUTROPHILS  67  40 - 73 %       LYMPHOCYTES  21  21 - 52 %       MONOCYTES  9  3 - 10 %       EOSINOPHILS  2  0 - 5 %       BASOPHILS  1  0 - 2 %       ABS. NEUTROPHILS  3.0  1.8 - 8.0 K/UL       ABS. LYMPHOCYTES  0.9  0.9 - 3.6 K/UL       ABS. MONOCYTES  0.4  0.05 - 1.2 K/UL       ABS. EOSINOPHILS  0.1  0.0 - 0.4 K/UL       ABS. BASOPHILS  0.0  0.0 - 0.06 K/UL       PLATELET COMMENTS  LARGE PLATELETS               RBC COMMENTS  ANISOCYTOSIS   2+                  RBC COMMENTS  TARGET CELLS   1+             RBC COMMENTS  HYPOCHROMIA   3+             RBC COMMENTS  TEARDROP CELLS   1+             RBC COMMENTS  RBC FRAGMENTS   POIKILOCYTOSIS   1+             DF  AUTOMATED          METABOLIC PANEL, COMPREHENSIVE          Collection Time: 12/20/16  5:17 PM         Result  Value  Ref Range            Sodium  142  136 - 145 mmol/L       Potassium  3.3 (L)  3.5 - 5.5 mmol/L       Chloride  106  100 - 108 mmol/L       CO2  29  21 - 32 mmol/L       Anion gap  7  3.0 - 18 mmol/L       Glucose  142 (H)   74 - 99 mg/dL       BUN  21 (H)  7.0 - 18 MG/DL       Creatinine  1.19  0.6 - 1.3 MG/DL       BUN/Creatinine ratio  31 (H)  12 - 20         GFR est AA  >60  >60 ml/min/1.35m2       GFR est non-AA  >60  >60 ml/min/1.46m2       Calcium  10.0  8.5 - 10.1 MG/DL  Bilirubin, total  0.2  0.2 - 1.0 MG/DL       ALT (SGPT)  23  13 - 56 U/L       AST (SGOT)  18  15 - 37 U/L       Alk. phosphatase  85  45 - 117 U/L       Protein, total  6.3 (L)  6.4 - 8.2 g/dL       Albumin  3.2 (L)  3.4 - 5.0 g/dL       Globulin  3.1  2.0 - 4.0 g/dL       A-G Ratio  1.0  0.8 - 1.7         TYPE & SCREEN          Collection Time: 12/20/16  5:17 PM         Result  Value  Ref Range            Crossmatch Expiration  12/23/2016         ABO/Rh(D)  B POSITIVE         Antibody screen  NEG         CALLED TO:  ALAGUN, ER, AT 1848 ON [id number] BY CLB         CALLED TO:                 [facility] AT 2205 ON 12/20/2016 BY GNS            Unit number  [id number]         Blood component type  RC LR         Unit division  00         Status of unit  ISSUED         Crossmatch result  Compatible         Unit number  [id number]         Blood component type  RC LR         Unit division  00         Status of unit  ISSUED         Crossmatch result  Compatible         Unit number  [id number]         Blood component type  RC LR         Unit division  00         Status of unit  ISSUED         Crossmatch result  Compatible         Unit number  [id number]         Blood component type  RC LR         Unit division  00         Status of unit  ISSUED         Crossmatch result  Compatible         IRON  PROFILE          Collection Time: 12/20/16  5:17 PM         Result  Value  Ref Range            Iron   17 (L)  50 - 175 ug/dL       TIBC  034 (H)  742 - 450 ug/dL       Iron  % saturation  3  %       FERRITIN  Collection Time: 12/20/16  5:17 PM         Result  Value  Ref Range            Ferritin  7 (L)  8 - 388 NG/ML       VITAMIN B12 & FOLATE           Collection Time: 12/20/16  5:17 PM         Result  Value  Ref Range            Vitamin B12  603  211 - 911 pg/mL       Folate  17.9 (H)  3.10 - 17.50 ng/mL       DIRECT COOMBS          Collection Time: 12/20/16  9:30 PM         Result  Value  Ref Range            DAT Poly  NEG         DAT IgG  NEG         DAT C3b/C3d  NEG         GLUCOSE, POC          Collection Time: 12/20/16 10:40 PM         Result  Value  Ref Range            Glucose (POC)  123 (H)  70 - 110 mg/dL                 Telemetry:normal sinus rhythm      Imaging: No imaging (12/20/16)             Total critical care time exclusive of procedures : 60 minutes      Dorothe Gaster, PA-C   Pulmonary Critical Care Medicine   [facility]

## 2016-12-21 NOTE — Consults (Signed)
 Consults by Lucita Sabine, MD at 12/21/16 1610                Author: Lucita Sabine, MD  Service: Gastroenterology  Author Type: Physician       Filed: 12/21/16 0932  Date of Service: 12/21/16 0915  Status: Signed          Editor: Lucita Sabine, MD (Physician)            Consult Orders        1. IP CONSULT TO GASTROENTEROLOGY [[id number]] ordered by Dorothe Gaster, PA-C at 12/21/16 [phone]                                   [email or web address]   [phone]      GASTROENTEROLOGY CONSULT           Impression:     1. Severe recurrent Fe def anemia with occult GI bleeding without overt GI blood loss in the setting of plavix  use-s/p transfusion. Has had EGD/Colo in 2017-pillcam  was recommended, but patient did not get it done. Hgb 8.6 after transfusion. Also non compliant on plavix .    2. Plavix  use due to CVA   3. GERD: h/o erosive esophagitis in 2017           Plan:        1. Resume diet.   2. Will repeat EGD and Colonoscopy as outpatient (no evidence of overt bleeding, and will need clearance to stop plavix  x 7 days)-given that it is > 1 year, reasonable to repeat this to make sure there are no changes. If those are negative, will need  pill cam. No plans for inpatient scopes unless there is active, ongoing bleeding.    3. IV iron  dose before discharge.    4. PPI Q day.       She will need to come in for a follow up visit to get these scheduled. She can call [phone] to get this set up.       Chief Complaint: anemia         HPI:   [name] Short is a 61 y.o.  female who I am being asked to see in consultation for an opinion regarding above. She has severe Fe def anemia-she was seen in 2017. She had an EGD in 11/2015-erosive esophagitis. Colo in 02/2016-HP  polyp in descending colon. She was supposed to have pillcam done, but states she was busy and forgot about it.       She had routine labs done this week and was noted to have a Hgb of 3.1, MCV 64.2, Ferritin 7. No overt bleeding, but stool was heme positive. No other GI  symptoms. She did have pagophagia.      PMH:      Past Medical History:        Diagnosis  Date         ?  Anemia           ?  Diabetes (HCC)             PSH:      Past Surgical History:         Procedure  Laterality  Date          ?  HX CESAREAN SECTION    1987          ?  HX DILATION AND CURETTAGE  1995           Social HX:      Social History          Social History         ?  Marital status:  SINGLE              Spouse name:  N/A         ?  Number of children:  N/A         ?  Years of education:  N/A          Occupational History        ?  Not on file.          Social History Main Topics         ?  Smoking status:  Current Every Day Smoker     ?  Smokeless tobacco:  Not on file                Comment: patient state that she smokes 1 black&mild /daily         ?  Alcohol use  No     ?  Drug use:  No         ?  Sexual activity:  No           Other Topics  Concern        ?  Not on file          Social History Narrative           FHX:      Family History         Problem  Relation  Age of Onset          ?  Hypertension  Other  35             parent,NOS          ?  Heart Disease  Other  62             parent, NOS          ?  Diabetes  Other  45             parent, NOS          ?  Cancer  Father             Allergy:    No Known Allergies        Patient Active Problem List        Diagnosis  Code         ?  Diabetes (HCC)  E11.9     ?  Anemia  D64.9     ?  CVA (cerebral vascular accident) (HCC)  I63.9     ?  Stroke [facility])  I63.9         ?  Symptomatic anemia  D64.9             Home Medications:          Prescriptions Prior to Admission        Medication  Sig         ?  multivitamin (ONE A DAY) tablet  Take 1 Tab by mouth daily.     ?  metOLazone (ZAROXOLYN) 5 mg tablet  Take 2.5 mg by mouth Three (3) times a week.     ?  EXENATIDE MICROSPHERES (BYDUREON SC)  by SubCUTAneous route every seven (7) days.     ?  insulin   aspart protamine/insulin  aspart (NOVOLOG MIX 70-30 FLEXPEN) 100 unit/mL (70-30) flex pen  30 units SQ every  morning and 25 units SQ every evening     ?  pantoprazole (PROTONIX) 40 mg tablet  Take 1 Tab by mouth Before breakfast and dinner.     ?  furosemide  (LASIX ) 20 mg tablet  Take 20 mg by mouth daily.     ?  cholecalciferol (VITAMIN D3) 1,000 unit tablet  Take 1 tablet by mouth daily.     ?  atorvastatin  (LIPITOR ) 40 mg tablet  Take 1 tablet by mouth nightly.     ?  clopidogrel  (PLAVIX ) 75 mg tablet  Take 1 tablet by mouth daily.         ?  lisinopril  (PRINIVIL , ZESTRIL ) 5 mg tablet  Take 1 tablet by mouth daily.             Review of Systems:           Constitutional:  No fevers, chills, weight loss, fatigue.        Skin:  No rashes, pruritis, jaundice, ulcerations, erythema.     HENT:  No headaches, nosebleeds, sinus pressure, rhinorrhea, sore throat.     Eyes:  No visual changes, blurred vision, eye pain, photophobia, jaundice.     Cardiovascular:  No chest pain, heart palpitations.        Respiratory:  No cough, SOB, wheezing, chest discomfort, orthopnea.     Gastrointestinal:          Genitourinary:  No dysuria, bleeding, discharge, pyuria.        Musculoskeletal:  No weakness, arthralgias, wasting.     Endo:  No sweats.     Heme:  No bruising, easy bleeding.        Allergies:  As noted.        Neurological:  Cranial nerves intact.  Alert and oriented. Gait not assessed.        Psychiatric:   No anxiety, depression, hallucinations.                 Visit Vitals         ?  BP  150/57     ?  Pulse  (!) 54     ?  Temp  98.1 F (36.7 C)     ?  Resp  12     ?  Ht  4\' 11"  (1.499 m)     ?  Wt  60.1 kg (132 lb 7.9 oz)     ?  SpO2  100%         ?  BMI  26.76 kg/m2             Physical Assessment:        constitutional: appearance: well developed, well nourished, normal habitus, no deformities,  in no acute distress.   skin: inspection:  no rashes, ulcers, icterus or other lesions; no clubbing or telangiectasias. palpation: no induration or subcutaneos nodules.    eyes: inspection: normal conjunctivae and lids; no jaundice  pupils:  normal   ENMT: mouth: normal oral mucosa,lips  and gums; good dentition. oropharynx: normal tongue, hard and soft palate; posterior pharynx without erithema, exudate or lesions.    neck: thyroid: normal size, consistency and position; no masses or tenderness.    respiratory: effort: normal chest excursion; no intercostal retraction or accessory muscle  use.   cardiovascular: abdominal aorta:  normal size and position; no bruits. palpation: PMI of normal size and  position; normal rhythm; no thrill or murmurs.    abdominal: abdomen: normal consistency; no tenderness or masses. hernias:  no hernias appreciated. liver: normal size and consistency. spleen:  not palpable.   rectal: hemoccult/guaiac: not performed.    musculoskeletal: digits and nails: no clubbing, cyanosis, petechiae or other inflammatory conditions.  gait: normal gait and station head and neck: normal range of motion; no pain, crepitation  or contracture. spine/ribs/pelvis: normal range of motion; no pain, deformity or contracture.    neurologic: cranial nerves: II-XII normal.    psychiatric: judgement/insight: within normal limits. memory:  within normal limits for recent and remote events. mood and affect: no evidence of depression, anxiety or agitation.  orientation: oriented to time, space and person.            Basic Metabolic Profile     Recent Labs          12/21/16    0557      NA   143      K   3.7      CL   110*      CO2   28      BUN   14      GLU   104*      CA   9.7      MG   1.9      PHOS   2.5                    CBC w/Diff       Recent Labs          12/21/16    0557      WBC   6.0      RBC   3.55*      HGB   8.6*      HCT   26.7*      MCV   75.2      MCH   24.2      MCHC   32.2      RDW   23.2*      PLT   275           Recent Labs          12/21/16    0557      GRANS   77*      LYMPH   12*      EOS   2                   Hepatic Function     Recent Labs          12/20/16    1717      ALB   3.2*      TP   6.3*      TBILI   0.2       SGOT   18      AP   85                  Coags     Recent Labs          12/21/16    0557      PTP   14.2      INR   1.2      APTT   27.5                    Lucita Sabine, MD.    [facility]  [facility]   [phone]   Cell: [phone]   [email or web address]   [email or web address]

## 2016-12-21 NOTE — Discharge Summary (Signed)
 Discharge Summary by Gustav Lehmann, PA-C at 12/21/16 1259                Author: Gustav Lehmann, PA-C  Service: Internal Medicine  Author Type: Physician Assistant       Filed: 12/21/16 2137  Date of Service: 12/21/16 1259  Status: Attested           Editor: Thelbert Finner (Physician Assistant)  Cosigner: Aleatha Hunting, MD at 12/25/16 1733          Attestation signed by Aleatha Hunting, MD at 12/25/16 1733          I have personally seen, evaluated and examined the patient. Agree with documentation of assessment and plan as documented by  [facility] Kelson Queenan, PA-C.    Pt feels great post transfusion. No rectal bleeding or melena, had brown stool.   D/w GI, Dr Virlinda Grimmer, recommend ok for d/c home and out pt folllow up for EGD/colo.   D/w pt and daughter at bedside in detail about out pt work up, pt says she will follow up and daughter agree with pt.    Home post IV iron  infusion.                                  [facility]   Discharge Summary             Patient: [name] Short  Age: 61 y.o.  DOB: Mar 17, 1956 MR#:  [id number] SSN:  [id number]   PCP on record: Gust Leghorn, MD   Admit date: 12/20/2016   Discharge date: 12/21/2016        Disposition:      [] Home   [] Home with Home Health   [] SNF/NH   [] Rehab   [x] Home with family    []  Alternate Facility:____________________      Discharge Diagnoses:                              1.  Profound symptomatic anemia d/t acute occult blood loss and iron  deficiency anemia   2.  Iron  deficiency anemia   3.  Occult lower GI bleeding without overt GI blood loss   4.  Hypertension   5.  Diabetes type 2.   6.  Tobacco abuse   6.  History of small brainstem stroke 2014   7.  Hypokalemia.          Discharge Medications:          Current Discharge Medication List              START taking these medications          Details        ascorbic acid, vitamin C, (VITAMIN C) 250 mg tablet  Take 1 Tab by mouth  two (2) times daily (with meals).   Qty: 60 Tab, Refills:  0               ferrous sulfate  325 mg (65 mg iron ) tablet  Take 1 Tab by mouth two (2) times daily (with meals).   Qty: 60 Tab, Refills:  0                     CONTINUE these medications which have CHANGED          Details  pantoprazole (PROTONIX) 40 mg tablet  Take 1 Tab by mouth daily.   Qty: 30 Tab, Refills:  0                     CONTINUE these medications which have NOT CHANGED          Details        multivitamin (ONE A DAY) tablet  Take 1 Tab by mouth daily.               metOLazone (ZAROXOLYN) 5 mg tablet  Take 2.5 mg by mouth Three (3) times a week.               EXENATIDE MICROSPHERES (BYDUREON SC)  by SubCUTAneous route every seven (7) days.               insulin  aspart protamine/insulin  aspart (NOVOLOG MIX 70-30 FLEXPEN) 100 unit/mL (70-30) flex pen  30 units SQ every morning and 25 units SQ every evening   Qty: 1 Package, Refills:  0               furosemide  (LASIX ) 20 mg tablet  Take 20 mg by mouth daily.               cholecalciferol (VITAMIN D3) 1,000 unit tablet  Take 1 tablet by mouth daily.   Qty: 30 tablet, Refills:  0               atorvastatin  (LIPITOR ) 40 mg tablet  Take 1 tablet by mouth nightly.   Qty: 30 tablet, Refills:  0               clopidogrel  (PLAVIX ) 75 mg tablet  Take 1 tablet by mouth daily.   Qty: 30 tablet, Refills:  2               lisinopril  (PRINIVIL , ZESTRIL ) 5 mg tablet  Take 1 tablet by mouth daily.   Qty: 30 tablet, Refills:  0                         Consults:     -    Procedures:   -      Significant Diagnostic Studies:    -          Hospital Course by Problem     1.  Profound symptomatic anemia d/t acute occult blood loss and iron  deficiency anemia: Admitted d/t low hemoglobin, when she had her labs drawn in the ED,  also had some progressively worsening dizziness. Pt was non-compliant with her iron  pills. Her hemoglobin was noted to be 3.1. Her hemoccult was positive. Pt received 4 units of PRBCs, hgb  was 9.7 prior to discharge. Iron  studies were remarkable, Fe was  17, Ferritin was 7, and TIBC was 495. She recieved iron  infusion of Venofer 500mg  along w/ PO iron  pills prior to discharge.  Also placed on Protonix drip. GI was consulted and recommended plan to repeat EGD and Colonoscopy as outpatient, provided she  can off Plavix  for 7 days. D/w Neurology, Dr. Devoria Font about case, states pt can stop Plavix  for 7 days for procedure. Per GI doctor, okay to continue Plavix  for now as no overt bleeding. Pt felt much better before discharge, no dizziness.    2.  History of iron  deficiency anemia.  Pt received IV iron  infusion and po iron  meds.    3.  Occult lower GI bleeding without overt  GI blood loss: no overt GI bleed, GI followed while inpt, plan to EGD and Colo as OP.    3.  Hypertension.  Pt was initially hypotensive at admission, BP elevated after transfusion, BP meds were continued at time of discharge.    4.  Diabetes type 2. Managed with Sliding scale insulin  while inpt. May resume insulin  dosing as OP as pt BG elevated after eating. hemoglobin A1c was 5.1.  Consulted diabetes educator.   5.  Tobacco abuse.  Smoking cessation education.   6.  History of small brainstem stroke 2014, continue Plavix  for now per GI and Neurology, may hold Plavix  for 7 days prior to EDG and Colo per discussion w/ neuro..   7.  Hypokalemia.  Repleted in the ED, and resolved while inpt. .        Today's examination of the patient revealed:          Subjective:     Pt states she is feeling much better after blood transfusions. Denies an chest pain, Nausea, vomiting, hematochezia, sob, bleeding, or diarrhea.     Objective:     VS:      Visit Vitals         ?  BP  136/55     ?  Pulse  63     ?  Temp  (P) 98.5 F (36.9 C)     ?  Resp  14     ?  Ht  4\' 11"  (1.499 m)     ?  Wt  60.1 kg (132 lb 7.9 oz)     ?  SpO2  100%         ?  BMI  26.76 kg/m2         Tmax/24hrs: Temp (24hrs), Avg:97.9 F (36.6 C), Min:97 F  (36.1 C), Max:99.5 F  (37.5 C)       Input/Output:    Intake/Output Summary (Last 24 hours) at 12/21/16 1259  Last data filed at 12/21/16 0600        Gross per 24 hour     Intake              1240 ml     Output                20 ml     Net              1220 ml           General:  Alert, NAD   Cardiovascular:  RRR   Pulmonary:  LSC throughout; respiratory effort WNL   GI:  +BS in all four quadrants, soft, non-tender   Extremities:  No edema; or cyanosis   Additional:        Labs:       Recent Results (from the past 24 hour(s))     CBC WITH AUTOMATED DIFF          Collection Time: 12/20/16  5:17 PM         Result  Value  Ref Range            WBC  4.4 (L)  4.6 - 13.2 K/uL       RBC  1.76 (L)  4.20 - 5.30 M/uL       HGB  3.1 (LL)  12.0 - 16.0 g/dL       HCT  16.1 (LL)  09.6 - 45.0 %       MCV  64.2 (L)  74.0 - 97.0 FL       MCH  17.6 (L)  24.0 - 34.0 PG       MCHC  27.4 (L)  31.0 - 37.0 g/dL       RDW  02.7 (H)  25.3 - 14.5 %       PLATELET  291  135 - 420 K/uL       MPV  8.6 (L)  9.2 - 11.8 FL       NEUTROPHILS  67  40 - 73 %       LYMPHOCYTES  21  21 - 52 %       MONOCYTES  9  3 - 10 %       EOSINOPHILS  2  0 - 5 %       BASOPHILS  1  0 - 2 %       ABS. NEUTROPHILS  3.0  1.8 - 8.0 K/UL       ABS. LYMPHOCYTES  0.9  0.9 - 3.6 K/UL       ABS. MONOCYTES  0.4  0.05 - 1.2 K/UL       ABS. EOSINOPHILS  0.1  0.0 - 0.4 K/UL       ABS. BASOPHILS  0.0  0.0 - 0.06 K/UL       PLATELET COMMENTS  LARGE PLATELETS          RBC COMMENTS  ANISOCYTOSIS   2+             RBC COMMENTS  TARGET CELLS   1+             RBC COMMENTS  HYPOCHROMIA   3+             RBC COMMENTS  TEARDROP CELLS   1+             RBC COMMENTS  RBC FRAGMENTS   POIKILOCYTOSIS   1+             DF  AUTOMATED          METABOLIC PANEL, COMPREHENSIVE          Collection Time: 12/20/16  5:17 PM         Result  Value  Ref Range            Sodium  142  136 - 145 mmol/L       Potassium  3.3 (L)  3.5 - 5.5 mmol/L       Chloride  106  100 - 108 mmol/L       CO2  29  21 - 32 mmol/L       Anion gap  7   3.0 - 18 mmol/L       Glucose  142 (H)  74 - 99 mg/dL       BUN  21 (H)  7.0 - 18 MG/DL       Creatinine  6.64  0.6 - 1.3 MG/DL       BUN/Creatinine ratio  31 (H)  12 - 20         GFR est AA  >60  >60 ml/min/1.25m2       GFR est non-AA  >60  >60 ml/min/1.37m2       Calcium  10.0  8.5 - 10.1 MG/DL       Bilirubin, total  0.2  0.2 - 1.0 MG/DL       ALT (SGPT)  23  13 - 56 U/L  AST (SGOT)  18  15 - 37 U/L       Alk. phosphatase  85  45 - 117 U/L       Protein, total  6.3 (L)  6.4 - 8.2 g/dL       Albumin  3.2 (L)  3.4 - 5.0 g/dL       Globulin  3.1  2.0 - 4.0 g/dL       A-G Ratio  1.0  0.8 - 1.7         TYPE & SCREEN          Collection Time: 12/20/16  5:17 PM         Result  Value  Ref Range            Crossmatch Expiration  12/23/2016         ABO/Rh(D)  B POSITIVE         Antibody screen  NEG              CALLED TO:  ALAGUN, ER, AT 1848 ON [id number] BY CLB              CALLED TO:                 [facility] AT 2205 ON 12/20/2016 BY GNS            Unit number  [id number]         Blood component type  RC LR         Unit division  00         Status of unit  TRANSFUSED         Crossmatch result  Compatible         Unit number  [id number]         Blood component type  RC LR         Unit division  00         Status of unit  TRANSFUSED         Crossmatch result  Compatible         Unit number  [id number]         Blood component type  RC LR         Unit division  00         Status of unit  ISSUED         Crossmatch result  Compatible         Unit number  [id number]         Blood component type  RC LR         Unit division  00         Status of unit  ISSUED         Crossmatch result  Compatible         IRON  PROFILE          Collection Time: 12/20/16  5:17 PM         Result  Value  Ref Range            Iron   17 (L)  50 - 175 ug/dL       TIBC  951 (H)  884 - 450 ug/dL       Iron  % saturation  3  %       FERRITIN          Collection Time: 12/20/16  5:17 PM         Result  Value  Ref Range             Ferritin  7 (L)  8 - 388 NG/ML       VITAMIN B12 & FOLATE          Collection Time: 12/20/16  5:17 PM         Result  Value  Ref Range            Vitamin B12  603  211 - 911 pg/mL       Folate  17.9 (H)  3.10 - 17.50 ng/mL       DIRECT COOMBS          Collection Time: 12/20/16  9:30 PM         Result  Value  Ref Range            DAT Poly  NEG         DAT IgG  NEG         DAT C3b/C3d  NEG         GLUCOSE, POC          Collection Time: 12/20/16 10:40 PM         Result  Value  Ref Range            Glucose (POC)  123 (H)  70 - 110 mg/dL       HEMOGLOBIN Z6X WITH EAG          Collection Time: 12/21/16  5:57 AM         Result  Value  Ref Range            Hemoglobin A1c  5.1  4.2 - 5.6 %       Est. average glucose  100  mg/dL       MAGNESIUM           Collection Time: 12/21/16  5:57 AM         Result  Value  Ref Range            Magnesium   1.9  1.6 - 2.6 mg/dL       PHOSPHORUS          Collection Time: 12/21/16  5:57 AM         Result  Value  Ref Range            Phosphorus  2.5  2.5 - 4.9 MG/DL       PROTHROMBIN TIME + INR          Collection Time: 12/21/16  5:57 AM         Result  Value  Ref Range            Prothrombin time  14.2  11.5 - 15.2 sec       INR  1.2  0.8 - 1.2         PTT          Collection Time: 12/21/16  5:57 AM         Result  Value  Ref Range            aPTT  27.5  23.0 - 36.4 SEC       CBC WITH AUTOMATED DIFF          Collection Time: 12/21/16  5:57 AM         Result  Value  Ref Range            WBC  6.0  4.6 -  13.2 K/uL       RBC  3.55 (L)  4.20 - 5.30 M/uL       HGB  8.6 (L)  12.0 - 16.0 g/dL       HCT  45.4 (L)  09.8 - 45.0 %       MCV  75.2  74.0 - 97.0 FL       MCH  24.2  24.0 - 34.0 PG       MCHC  32.2  31.0 - 37.0 g/dL       RDW  11.9 (H)  14.7 - 14.5 %       PLATELET  275  135 - 420 K/uL       MPV  9.3  9.2 - 11.8 FL       NEUTROPHILS  77 (H)  40 - 73 %       LYMPHOCYTES  12 (L)  21 - 52 %       MONOCYTES  9  3 - 10 %       EOSINOPHILS  2  0 - 5 %       BASOPHILS  0  0 - 2 %       ABS.  NEUTROPHILS  4.7  1.8 - 8.0 K/UL       ABS. LYMPHOCYTES  0.7 (L)  0.9 - 3.6 K/UL       ABS. MONOCYTES  0.5  0.05 - 1.2 K/UL       ABS. EOSINOPHILS  0.1  0.0 - 0.4 K/UL       ABS. BASOPHILS  0.0  0.0 - 0.1 K/UL       DF  AUTOMATED          PLATELET COMMENTS  ADEQUATE PLATELETS          RBC COMMENTS  ANISOCYTOSIS   2+             RBC COMMENTS  MICROCYTOSIS   1+             RBC COMMENTS  Dimorphic RBCs   SCHISTOCYTES   1+             RBC COMMENTS  OVALOCYTES   FEW   POLYCHROMASIA   1+             RBC COMMENTS  TARGET CELLS   FEW             METABOLIC PANEL, BASIC          Collection Time: 12/21/16  5:57 AM         Result  Value  Ref Range            Sodium  143  136 - 145 mmol/L       Potassium  3.7  3.5 - 5.5 mmol/L       Chloride  110 (H)  100 - 108 mmol/L       CO2  28  21 - 32 mmol/L       Anion gap  5  3.0 - 18 mmol/L       Glucose  104 (H)  74 - 99 mg/dL       BUN  14  7.0 - 18 MG/DL       Creatinine  8.29  0.6 - 1.3 MG/DL       BUN/Creatinine ratio  22 (H)  12 - 20         GFR est AA  >60  >  60 ml/min/1.45m2       GFR est non-AA  >60  >60 ml/min/1.35m2       Calcium  9.7  8.5 - 10.1 MG/DL       GLUCOSE, POC          Collection Time: 12/21/16  7:34 AM         Result  Value  Ref Range            Glucose (POC)  107  70 - 110 mg/dL       MRSA SCREEN - PCR (NASAL)          Collection Time: 12/21/16  8:29 AM         Result  Value  Ref Range            Special Requests:  NO SPECIAL REQUESTS          Culture result:                  MRSA target DNA is not detected (presumptive not colonized with MRSA)       CALCIUM, IONIZED          Collection Time: 12/21/16 10:32 AM         Result  Value  Ref Range            Ionized Calcium  1.27  1.12 - 1.32 MMOL/L       HGB & HCT          Collection Time: 12/21/16 10:32 AM         Result  Value  Ref Range            HGB  9.7 (L)  12.0 - 16.0 g/dL       HCT  84.6 (L)  96.2 - 45.0 %       GLUCOSE, POC          Collection Time: 12/21/16 10:46 AM         Result  Value  Ref Range             Glucose (POC)  116 (H)  70 - 110 mg/dL        Additional Data Reviewed:       Condition:      Follow-up Appointments:     1. Your PCP: Gust Leghorn, MD, within 7-10days   2.             Please follow-up on tests/labs that are still pending:   1.       >30 minutes spent coordinating this discharge (review instructions/follow-up, prescriptions, preparing report for sign off)      Signed:   Gustav Lehmann, PA-C   12/21/2016   12:59 PM

## 2016-12-21 NOTE — Consults (Signed)
[facility]  Pulmonary, Critical Care, and Sleep Medicine      Name: [name] Short MRN: [id number]   DOB: Feb 25, 1956 Hospital: [facility]   Date: 12/21/2016          Critical Care Initial Patient Consult    Requesting MD:  Tamala Julian                                                Reason for CC Consult: Severe Anemia    IMPRESSION:   ?? Asymptomatic Severe Anemia - Likely Iron deficient; Initial Hgb in ED - 3.1. Transfusing 4u PRBC's.  ?? Mild Hypokalemia - Now resolved  ?? Hx of CVA, DM II, Fe++ Deficient Anemia - Non-complaint with Fe++ replacement       RECOMMENDATIONS:   ?? Resp - Comfortable on RA; titrate supp O2 PRN for SpO2 > 94%; aspiration precautions - HOB >30'; encourage ICS; bronchial hygiene.   ?? ID - Afebrile; aleukocytosis; low suspicion for infection; monitor s/s for infection. No ABX at this time.  ?? CVS - HD stable; MAP >65; con't Lasix (home med) BID.  ?? Heme/Onc- Transfusing 1U PRBC's now; ordered for 3 more units to be transfused STAT; Iron is low; check Haptoglobin; PT/PTT/INR; direct coombs. Heme occult (+) in ED; monitor for any sign of active bleeding. Hold Plavix. Start on Iron replacement therapy  ?? Metabolic - Daily BMP; check Mg & Ionized Ca++ in AM; monitor e-lytes; replace PRN   ?? Renal - Trend renal indices; Cr stable; no active issues  ?? Endocrine - Glycemic control goal <180; SSI; POC glucose q6 while NPO  ?? Neuro/ Pain/ Sedation - No active issues  ?? GI - Keep NPO overnight for any possible procedures in AM; Consult GI in AM; Endoscopy (11/29/15) with Erosive Esophagitis, (-) bleed at that time. Con't daily Protonix.   ?? Prophylaxis - DVT (SCD's) , GI (Protonix)     Subjective/History:     This patient has been seen and evaluated at the request of Dr. Tamala Julian for Severe Anemia, Asymptomatic.  Patient is a 61 y.o. female with PMH of CVA, DM II, and Iron Deficiency anemia, non-compliant with taking Iron pills,  who presented to [facility] ED on 12/20/16 after being advised to go to ED by her PCP when her routine lab work revealed she had "low blood count". Pt is otherwise largely asymptomatic. She does note that she has more fatigue lately, and that she eats a lot of ice; pt admits to being non-compliant with taking her Iron pills as prescribed. She also notes that her blood sugars have been fluctuating lately, and that she checks her sugars regularly. ED w/o revealed Hgb of 3.1; began transfusing 1u PRBC's in ED; transferred to ICU for further management.      Of note, Heme occult stool was (+) in ED; pt had seen GI in the past, and had an EGD last year (11/2015) with Dr. Daivd Council that showed erosive Esophagitis, but no acute bleed. She was apparently supposed to f/u with GI for a Pill Endoscopy, but states that she felt better so she did not follow up.     Upon initial evaluation in ED, pt is AOx3, NAD and resting comfortably in bed on RA. Pt admits to some dizziness with ambulation, but otherwise denies any SOB/DIB, cough, congestion, CP, Palpitations, abd  pain, N/V/D; H/A; F/C; dysuria, dark tarry stool. Denies any recent sick contacts.     Past Medical History:   Diagnosis Date   ??? Anemia    ??? Diabetes ([place])       Past Surgical History:   Procedure Laterality Date   ??? HX CESAREAN SECTION  1987   ??? HX DILATION AND CURETTAGE  1995      Prior to Admission medications    Medication Sig Start Date End Date Taking? Authorizing Provider   multivitamin (ONE A DAY) tablet Take 1 Tab by mouth daily.   Yes Historical Provider   metOLazone (ZAROXOLYN) 5 mg tablet Take 2.5 mg by mouth Three (3) times a week.   Yes Historical Provider   pantoprazole (PROTONIX) 40 mg tablet Take 1 Tab by mouth Before breakfast and dinner. 11/27/15   Ignacia Bayley, MD   furosemide (LASIX) 20 mg tablet Take 20 mg by mouth daily.    Historical Provider   EXENATIDE MICROSPHERES (BYDUREON SC) by SubCUTAneous route every seven (7) days.    Historical Provider    cholecalciferol (VITAMIN D3) 1,000 unit tablet Take 1 tablet by mouth daily. 04/25/13   Michel Bickers, MD   insulin aspart protamine/insulin aspart (NOVOLOG MIX 70-30 FLEXPEN) 100 unit/mL (70-30) flex pen 30 units SQ every morning and 25 units SQ every evening 04/25/13   Michel Bickers, MD   atorvastatin (LIPITOR) 40 mg tablet Take 1 tablet by mouth nightly. 04/25/13   Michel Bickers, MD   clopidogrel (PLAVIX) 75 mg tablet Take 1 tablet by mouth daily. 04/25/13   Michel Bickers, MD   lisinopril (PRINIVIL, ZESTRIL) 5 mg tablet Take 1 tablet by mouth daily. 04/26/13   Michel Bickers, MD     Current Facility-Administered Medications   Medication Dose Route Frequency   ??? atorvastatin (LIPITOR) tablet 40 mg  40 mg Oral QHS   ??? furosemide (LASIX) tablet 20 mg  20 mg Oral DAILY   ??? therapeutic multivitamin (THERAGRAN) tablet 1 Tab  1 Tab Oral DAILY   ??? sodium chloride (NS) flush 5-10 mL  5-10 mL IntraVENous Q8H   ??? ferrous sulfate tablet 325 mg  1 Tab Oral BID WITH MEALS   ??? ascorbic acid (vitamin C) (VITAMIN C) tablet 250 mg  250 mg Oral BID WITH MEALS   ??? pantoprazole (PROTONIX) 40 mg in sodium chloride 0.9% 10 mL injection  40 mg IntraVENous DAILY   ??? insulin lispro (HUMALOG) injection   SubCUTAneous Q6H     No Known Allergies   Social History   Substance Use Topics   ??? Smoking status: Current Every Day Smoker   ??? Smokeless tobacco: Not on file      Comment: patient state that she smokes 1 black&mild /daily   ??? Alcohol use No      Family History   Problem Relation Age of Onset   ??? Hypertension Other 35     parent,NOS   ??? Heart Disease Other 62     parent, NOS   ??? Diabetes Other 75     parent, NOS   ??? Cancer Father         Review of Systems:  Pertinent items are noted in HPI.    Objective:   Vital Signs:    Visit Vitals   ??? BP 129/56 (BP 1 Location: Left arm, BP Patient Position: At rest)   ??? Pulse (!) 55   ??? Temp 98.2 ??F (36.8 ??C)   ??? Resp 13   ???  Ht 4' 11"  (1.499 m)   ??? Wt 60.1 kg (132 lb 7.9 oz)   ??? SpO2 100%    ??? BMI 26.76 kg/m2       O2 Device: Room air   O2 Flow Rate (L/min): 2 l/min   Temp (24hrs), Avg:97.8 ??F (36.6 ??C), Min:97 ??F (36.1 ??C), Max:99.5 ??F (37.5 ??C)       Intake/Output:   Last shift:      05/19 1901 - 05/20 0700  In: 1240   Out: -   Last 3 shifts:      Intake/Output Summary (Last 24 hours) at 12/21/16 0615  Last data filed at 12/21/16 0455   Gross per 24 hour   Intake             1240 ml   Output                0 ml   Net             1240 ml       Physical Exam:    General:  Alert, cooperative, NAD, appears stated age.   Head:  Normocephalic, without obvious abnormality, atraumatic.   Eyes:  Conjunctivae/corneas clear, pale. PERRL, EOMs intact.   Nose: Nares normal. Septum midline. Mucosa normal, pale. No drainage or sinus tenderness.   Throat: Lips, mucosa, and tongue pale. Teeth and gums normal.   Neck: Supple, symmetrical, trachea midline, No JVD.   Back:   Symmetric, no curvature. ROM normal.   Lungs:   Symmetrical chest rise; good AE bilat; CTAB   Heart:  RRR, S1, S2 normal, no m/r/g   Abdomen:   Soft, non-tender. Bowel sounds normal. No masses,  No organomegaly.   Extremities: Extremities normal, atraumatic, no cyanosis or edema.   Pulses: 2+ and symmetric all extremities.   Skin: Skin color, texture, turgor normal. No rashes or lesions   Neurologic: Grossly nonfocal       Data:     Recent Results (from the past 24 hour(s))   CBC WITH AUTOMATED DIFF    Collection Time: 12/20/16  5:17 PM   Result Value Ref Range    WBC 4.4 (L) 4.6 - 13.2 K/uL    RBC 1.76 (L) 4.20 - 5.30 M/uL    HGB 3.1 (LL) 12.0 - 16.0 g/dL    HCT 11.3 (LL) 35.0 - 45.0 %    MCV 64.2 (L) 74.0 - 97.0 FL    MCH 17.6 (L) 24.0 - 34.0 PG    MCHC 27.4 (L) 31.0 - 37.0 g/dL    RDW 20.9 (H) 11.6 - 14.5 %    PLATELET 291 135 - 420 K/uL    MPV 8.6 (L) 9.2 - 11.8 FL    NEUTROPHILS 67 40 - 73 %    LYMPHOCYTES 21 21 - 52 %    MONOCYTES 9 3 - 10 %    EOSINOPHILS 2 0 - 5 %    BASOPHILS 1 0 - 2 %    ABS. NEUTROPHILS 3.0 1.8 - 8.0 K/UL     ABS. LYMPHOCYTES 0.9 0.9 - 3.6 K/UL    ABS. MONOCYTES 0.4 0.05 - 1.2 K/UL    ABS. EOSINOPHILS 0.1 0.0 - 0.4 K/UL    ABS. BASOPHILS 0.0 0.0 - 0.06 K/UL    PLATELET COMMENTS LARGE PLATELETS      RBC COMMENTS ANISOCYTOSIS  2+        RBC COMMENTS TARGET CELLS  1+  RBC COMMENTS HYPOCHROMIA  3+        RBC COMMENTS TEARDROP CELLS  1+        RBC COMMENTS RBC FRAGMENTS  POIKILOCYTOSIS  1+        DF AUTOMATED     METABOLIC PANEL, COMPREHENSIVE    Collection Time: 12/20/16  5:17 PM   Result Value Ref Range    Sodium 142 136 - 145 mmol/L    Potassium 3.3 (L) 3.5 - 5.5 mmol/L    Chloride 106 100 - 108 mmol/L    CO2 29 21 - 32 mmol/L    Anion gap 7 3.0 - 18 mmol/L    Glucose 142 (H) 74 - 99 mg/dL    BUN 21 (H) 7.0 - 18 MG/DL    Creatinine 0.68 0.6 - 1.3 MG/DL    BUN/Creatinine ratio 31 (H) 12 - 20      GFR est AA >60 >60 ml/min/1.23m    GFR est non-AA >60 >60 ml/min/1.713m   Calcium 10.0 8.5 - 10.1 MG/DL    Bilirubin, total 0.2 0.2 - 1.0 MG/DL    ALT (SGPT) 23 13 - 56 U/L    AST (SGOT) 18 15 - 37 U/L    Alk. phosphatase 85 45 - 117 U/L    Protein, total 6.3 (L) 6.4 - 8.2 g/dL    Albumin 3.2 (L) 3.4 - 5.0 g/dL    Globulin 3.1 2.0 - 4.0 g/dL    A-G Ratio 1.0 0.8 - 1.7     TYPE & SCREEN    Collection Time: 12/20/16  5:17 PM   Result Value Ref Range    Crossmatch Expiration 12/23/2016     ABO/Rh(D) B POSITIVE     Antibody screen NEG     CALLED TO: ALAGUN, ER, AT 1848 ON [id number] CLB     CALLED TO:       KAClaude MangesCU AT 2205 ON 12/20/2016 BY GNS    Unit number [id number]   Blood component type RC LR     Unit division 00     Status of unit ISSUED     Crossmatch result Compatible     Unit number [id number]   Blood component type RC LR     Unit division 00     Status of unit ISSUED     Crossmatch result Compatible     Unit number [id number]   Blood component type RC LR     Unit division 00     Status of unit ISSUED     Crossmatch result Compatible     Unit number [id number]    Blood component type RC LR     Unit division 00     Status of unit ISSUED     Crossmatch result Compatible    IRON PROFILE    Collection Time: 12/20/16  5:17 PM   Result Value Ref Range    Iron 17 (L) 50 - 175 ug/dL    TIBC 495 (H) 250 - 450 ug/dL    Iron % saturation 3 %   FERRITIN    Collection Time: 12/20/16  5:17 PM   Result Value Ref Range    Ferritin 7 (L) 8 - 388 NG/ML   VITAMIN B12 & FOLATE    Collection Time: 12/20/16  5:17 PM   Result Value Ref Range    Vitamin B12 603 211 - 911 pg/mL    Folate 17.9 (H)  3.10 - 17.50 ng/mL   DIRECT COOMBS    Collection Time: 12/20/16  9:30 PM   Result Value Ref Range    DAT Poly NEG     DAT IgG NEG     DAT C3b/C3d NEG    GLUCOSE, POC    Collection Time: 12/20/16 10:40 PM   Result Value Ref Range    Glucose (POC) 123 (H) 70 - 110 mg/dL             Telemetry:normal sinus rhythm    Imaging: No imaging (12/20/16)       Total critical care time exclusive of procedures: 60 minutes    Adela Glimpse, PA-C  Pulmonary [facility]

## 2016-12-21 NOTE — Discharge Summary (Signed)
[facility]  Discharge Summary       Patient: Shacara Cozine Age: 61 y.o. DOB: 23-Feb-1956 MR#: [id number] SSN: [id number]  PCP on record: Su Hoff, MD  Admit date: 12/20/2016  Discharge date: 12/21/2016    Disposition:    [] Home   [] Home with Home Health   [] SNF/NH   [] Rehab   [x] Home with family   [] Alternate Facility:____________________    Discharge Diagnoses:                             1.  Profound symptomatic anemia d/t acute occult blood loss and iron deficiency anemia  2.  Iron deficiency anemia  3.  Occult lower GI bleeding without overt GI blood loss  4.  Hypertension  5.  Diabetes type 2.  6.  Tobacco abuse  6.  History of small brainstem stroke 2014  7.  Hypokalemia.      Discharge Medications:     Current Discharge Medication List      START taking these medications    Details   ascorbic acid, vitamin C, (VITAMIN C) 250 mg tablet Take 1 Tab by mouth two (2) times daily (with meals).  Qty: 60 Tab, Refills: 0      ferrous sulfate 325 mg (65 mg iron) tablet Take 1 Tab by mouth two (2) times daily (with meals).  Qty: 60 Tab, Refills: 0         CONTINUE these medications which have CHANGED    Details   pantoprazole (PROTONIX) 40 mg tablet Take 1 Tab by mouth daily.  Qty: 30 Tab, Refills: 0         CONTINUE these medications which have NOT CHANGED    Details   multivitamin (ONE A DAY) tablet Take 1 Tab by mouth daily.      metOLazone (ZAROXOLYN) 5 mg tablet Take 2.5 mg by mouth Three (3) times a week.      EXENATIDE MICROSPHERES (BYDUREON SC) by SubCUTAneous route every seven (7) days.      insulin aspart protamine/insulin aspart (NOVOLOG MIX 70-30 FLEXPEN) 100 unit/mL (70-30) flex pen 30 units SQ every morning and 25 units SQ every evening  Qty: 1 Package, Refills: 0      furosemide (LASIX) 20 mg tablet Take 20 mg by mouth daily.      cholecalciferol (VITAMIN D3) 1,000 unit tablet Take 1 tablet by mouth daily.  Qty: 30 tablet, Refills: 0       atorvastatin (LIPITOR) 40 mg tablet Take 1 tablet by mouth nightly.  Qty: 30 tablet, Refills: 0      clopidogrel (PLAVIX) 75 mg tablet Take 1 tablet by mouth daily.  Qty: 30 tablet, Refills: 2      lisinopril (PRINIVIL, ZESTRIL) 5 mg tablet Take 1 tablet by mouth daily.  Qty: 30 tablet, Refills: 0             Consults:    -   Procedures:  -     Significant Diagnostic Studies:   -      Hospital Course by Problem   1.  Profound symptomatic anemia d/t acute occult blood loss and iron deficiency anemia: Admitted d/t low hemoglobin, when she had her labs drawn in the ED, also had some progressively worsening dizziness. Pt was non-compliant with her iron pills. Her hemoglobin was noted to be 3.1. Her hemoccult was positive. Pt received 4 units of PRBCs,  hgb was 9.7 prior to discharge. Iron studies were remarkable, Fe was 17, Ferritin was 7, and TIBC was 495. She recieved iron infusion of Venofer 57m along w/ PO iron pills prior to discharge.  Also placed on Protonix drip. GI was consulted and recommended plan to repeat EGD and Colonoscopy as outpatient, provided she can off Plavix for 7 days. D/w Neurology, Dr. CLaurann Montanaabout case, states pt can stop Plavix for 7 days for procedure. Per GI doctor, okay to continue Plavix for now as no overt bleeding. Pt felt much better before discharge, no dizziness.   2.  History of iron deficiency anemia.  Pt received IV iron infusion and po iron meds.   3.  Occult lower GI bleeding without overt GI blood loss: no overt GI bleed, GI followed while inpt, plan to EGD and Colo as OP.   3.  Hypertension.  Pt was initially hypotensive at admission, BP elevated after transfusion, BP meds were continued at time of discharge.   4.  Diabetes type 2. Managed with Sliding scale insulin while inpt. May resume insulin dosing as OP as pt BG elevated after eating. hemoglobin A1c was 5.1.  Consulted diabetes educator.  5.  Tobacco abuse.  Smoking cessation education.   6.  History of small brainstem stroke 2014, continue Plavix for now per GI and Neurology, may hold Plavix for 7 days prior to [place] discussion w/ neuro..  7.  Hypokalemia.  Repleted in the ED, and resolved while inpt. .    Today's examination of the patient revealed:     Subjective:   Pt states she is feeling much better after blood transfusions. Denies an chest pain, Nausea, vomiting, hematochezia, sob, bleeding, or diarrhea.  Objective:   VS:   Visit Vitals   ??? BP 136/55   ??? Pulse 63   ??? Temp (P) 98.5 ??F (36.9 ??C)   ??? Resp 14   ??? Ht 4' 11"  (1.499 m)   ??? Wt 60.1 kg (132 lb 7.9 oz)   ??? SpO2 100%   ??? BMI 26.76 kg/m2      Tmax/24hrs: Temp (24hrs), Avg:97.9 ??F (36.6 ??C), Min:97 ??F (36.1 ??C), Max:99.5 ??F (37.5 ??C)     Input/Output:   Intake/Output Summary (Last 24 hours) at 12/21/16 1259  Last data filed at 12/21/16 0600   Gross per 24 hour   Intake             1240 ml   Output               20 ml   Net             1220 ml       General:  Alert, NAD  Cardiovascular:  RRR  Pulmonary:  LSC throughout; respiratory effort WNL  GI:  +BS in all four quadrants, soft, non-tender  Extremities:  No edema; or cyanosis  Additional:      Labs:    Recent Results (from the past 24 hour(s))   CBC WITH AUTOMATED DIFF    Collection Time: 12/20/16  5:17 PM   Result Value Ref Range    WBC 4.4 (L) 4.6 - 13.2 K/uL    RBC 1.76 (L) 4.20 - 5.30 M/uL    HGB 3.1 (LL) 12.0 - 16.0 g/dL    HCT 11.3 (LL) 35.0 - 45.0 %    MCV 64.2 (L) 74.0 - 97.0 FL    MCH 17.6 (L) 24.0 - 34.0 PG  MCHC 27.4 (L) 31.0 - 37.0 g/dL    RDW 20.9 (H) 11.6 - 14.5 %    PLATELET 291 135 - 420 K/uL    MPV 8.6 (L) 9.2 - 11.8 FL    NEUTROPHILS 67 40 - 73 %    LYMPHOCYTES 21 21 - 52 %    MONOCYTES 9 3 - 10 %    EOSINOPHILS 2 0 - 5 %    BASOPHILS 1 0 - 2 %    ABS. NEUTROPHILS 3.0 1.8 - 8.0 K/UL    ABS. LYMPHOCYTES 0.9 0.9 - 3.6 K/UL    ABS. MONOCYTES 0.4 0.05 - 1.2 K/UL    ABS. EOSINOPHILS 0.1 0.0 - 0.4 K/UL    ABS. BASOPHILS 0.0 0.0 - 0.06 K/UL     PLATELET COMMENTS LARGE PLATELETS      RBC COMMENTS ANISOCYTOSIS  2+        RBC COMMENTS TARGET CELLS  1+        RBC COMMENTS HYPOCHROMIA  3+        RBC COMMENTS TEARDROP CELLS  1+        RBC COMMENTS RBC FRAGMENTS  POIKILOCYTOSIS  1+        DF AUTOMATED     METABOLIC PANEL, COMPREHENSIVE    Collection Time: 12/20/16  5:17 PM   Result Value Ref Range    Sodium 142 136 - 145 mmol/L    Potassium 3.3 (L) 3.5 - 5.5 mmol/L    Chloride 106 100 - 108 mmol/L    CO2 29 21 - 32 mmol/L    Anion gap 7 3.0 - 18 mmol/L    Glucose 142 (H) 74 - 99 mg/dL    BUN 21 (H) 7.0 - 18 MG/DL    Creatinine 0.68 0.6 - 1.3 MG/DL    BUN/Creatinine ratio 31 (H) 12 - 20      GFR est AA >60 >60 ml/min/1.24m    GFR est non-AA >60 >60 ml/min/1.727m   Calcium 10.0 8.5 - 10.1 MG/DL    Bilirubin, total 0.2 0.2 - 1.0 MG/DL    ALT (SGPT) 23 13 - 56 U/L    AST (SGOT) 18 15 - 37 U/L    Alk. phosphatase 85 45 - 117 U/L    Protein, total 6.3 (L) 6.4 - 8.2 g/dL    Albumin 3.2 (L) 3.4 - 5.0 g/dL    Globulin 3.1 2.0 - 4.0 g/dL    A-G Ratio 1.0 0.8 - 1.7     TYPE & SCREEN    Collection Time: 12/20/16  5:17 PM   Result Value Ref Range    Crossmatch Expiration 12/23/2016     ABO/Rh(D) B POSITIVE     Antibody screen NEG     CALLED TO: ALAGUN, ER, AT 1848 ON [id number] CLB     CALLED TO:       KAClaude MangesCU AT 2205 ON 12/20/2016 BY GNS    Unit number [id number]   Blood component type RC LR     Unit division 00     Status of unit TRANSFUSED     Crossmatch result Compatible     Unit number [id number]   Blood component type RC LR     Unit division 00     Status of unit TRANSFUSED     Crossmatch result Compatible     Unit number [id number]   Blood component type RC LR     Unit  division 00     Status of unit ISSUED     Crossmatch result Compatible     Unit number [id number]     Blood component type RC LR     Unit division 00     Status of unit ISSUED     Crossmatch result Compatible    IRON PROFILE    Collection Time: 12/20/16  5:17 PM    Result Value Ref Range    Iron 17 (L) 50 - 175 ug/dL    TIBC 495 (H) 250 - 450 ug/dL    Iron % saturation 3 %   FERRITIN    Collection Time: 12/20/16  5:17 PM   Result Value Ref Range    Ferritin 7 (L) 8 - 388 NG/ML   VITAMIN B12 & FOLATE    Collection Time: 12/20/16  5:17 PM   Result Value Ref Range    Vitamin B12 603 211 - 911 pg/mL    Folate 17.9 (H) 3.10 - 17.50 ng/mL   DIRECT COOMBS    Collection Time: 12/20/16  9:30 PM   Result Value Ref Range    DAT Poly NEG     DAT IgG NEG     DAT C3b/C3d NEG    GLUCOSE, POC    Collection Time: 12/20/16 10:40 PM   Result Value Ref Range    Glucose (POC) 123 (H) 70 - 110 mg/dL   HEMOGLOBIN A1C WITH EAG    Collection Time: 12/21/16  5:57 AM   Result Value Ref Range    Hemoglobin A1c 5.1 4.2 - 5.6 %    Est. average glucose 100 mg/dL   MAGNESIUM    Collection Time: 12/21/16  5:57 AM   Result Value Ref Range    Magnesium 1.9 1.6 - 2.6 mg/dL   PHOSPHORUS    Collection Time: 12/21/16  5:57 AM   Result Value Ref Range    Phosphorus 2.5 2.5 - 4.9 MG/DL   PROTHROMBIN TIME + INR    Collection Time: 12/21/16  5:57 AM   Result Value Ref Range    Prothrombin time 14.2 11.5 - 15.2 sec    INR 1.2 0.8 - 1.2     PTT    Collection Time: 12/21/16  5:57 AM   Result Value Ref Range    aPTT 27.5 23.0 - 36.4 SEC   CBC WITH AUTOMATED DIFF    Collection Time: 12/21/16  5:57 AM   Result Value Ref Range    WBC 6.0 4.6 - 13.2 K/uL    RBC 3.55 (L) 4.20 - 5.30 M/uL    HGB 8.6 (L) 12.0 - 16.0 g/dL    HCT 26.7 (L) 35.0 - 45.0 %    MCV 75.2 74.0 - 97.0 FL    MCH 24.2 24.0 - 34.0 PG    MCHC 32.2 31.0 - 37.0 g/dL    RDW 23.2 (H) 11.6 - 14.5 %    PLATELET 275 135 - 420 K/uL    MPV 9.3 9.2 - 11.8 FL    NEUTROPHILS 77 (H) 40 - 73 %    LYMPHOCYTES 12 (L) 21 - 52 %    MONOCYTES 9 3 - 10 %    EOSINOPHILS 2 0 - 5 %    BASOPHILS 0 0 - 2 %    ABS. NEUTROPHILS 4.7 1.8 - 8.0 K/UL    ABS. LYMPHOCYTES 0.7 (L) 0.9 - 3.6 K/UL    ABS. MONOCYTES 0.5 0.05 - 1.2 K/UL    ABS.  EOSINOPHILS 0.1 0.0 - 0.4 K/UL     ABS. BASOPHILS 0.0 0.0 - 0.1 K/UL    DF AUTOMATED      PLATELET COMMENTS ADEQUATE PLATELETS      RBC COMMENTS ANISOCYTOSIS  2+        RBC COMMENTS MICROCYTOSIS  1+        RBC COMMENTS Dimorphic RBCs  SCHISTOCYTES  1+        RBC COMMENTS OVALOCYTES  FEW  POLYCHROMASIA  1+        RBC COMMENTS TARGET CELLS  FEW       METABOLIC PANEL, BASIC    Collection Time: 12/21/16  5:57 AM   Result Value Ref Range    Sodium 143 136 - 145 mmol/L    Potassium 3.7 3.5 - 5.5 mmol/L    Chloride 110 (H) 100 - 108 mmol/L    CO2 28 21 - 32 mmol/L    Anion gap 5 3.0 - 18 mmol/L    Glucose 104 (H) 74 - 99 mg/dL    BUN 14 7.0 - 18 MG/DL    Creatinine 0.63 0.6 - 1.3 MG/DL    BUN/Creatinine ratio 22 (H) 12 - 20      GFR est AA >60 >60 ml/min/1.48m    GFR est non-AA >60 >60 ml/min/1.775m   Calcium 9.7 8.5 - 10.1 MG/DL   GLUCOSE, POC    Collection Time: 12/21/16  7:34 AM   Result Value Ref Range    Glucose (POC) 107 70 - 110 mg/dL   MRSA SCREEN - PCR (NASAL)    Collection Time: 12/21/16  8:29 AM   Result Value Ref Range    Special Requests: NO SPECIAL REQUESTS      Culture result:        MRSA target DNA is not detected (presumptive not colonized with MRSA)   CALCIUM, IONIZED    Collection Time: 12/21/16 10:32 AM   Result Value Ref Range    Ionized Calcium 1.27 1.12 - 1.32 MMOL/L   HGB & HCT    Collection Time: 12/21/16 10:32 AM   Result Value Ref Range    HGB 9.7 (L) 12.0 - 16.0 g/dL    HCT 30.9 (L) 35.0 - 45.0 %   GLUCOSE, POC    Collection Time: 12/21/16 10:46 AM   Result Value Ref Range    Glucose (POC) 116 (H) 70 - 110 mg/dL     Additional Data Reviewed:     Condition:   Follow-up Appointments:   1. Your PCP: ROSu HoffMD, within 7-10days  2.         Please follow-up on tests/labs that are still pending:  1.     >30 minutes spent coordinating this discharge (review instructions/follow-up, prescriptions, preparing report for sign off)    Signed:  OlChase PicketPA-C  12/21/2016  12:59 PM

## 2016-12-21 NOTE — Other (Signed)
0730 Bedside and Verbal shift change report given to Sophia Staley RN (oncoming nurse) by K. Cooke RN (offgoing nurse). Report included the following information SBAR, Kardex, MAR and Recent Results.

## 2016-12-21 NOTE — Other (Signed)
Pt discharged this pm, reviewed medications, discharge plans and importance of medication and medical compliance. Pt covered for elevated BG and states she will follow up with BG recheck in 2 hours. Pt verbalizes importance of ongoing  compliance and states she will do much better with follow up post discharge. Daughter at bedside. Due to weekend office closures, unit secretary to call patient on tomorrow to confirm follow up appointment dates and times-pt made aware.

## 2016-12-21 NOTE — Progress Notes (Addendum)
Patient doing well after transfusions.  If repeat H/H stable then can transfer out of ICU to med floor for further work-up.    Addendum:  Hgb >9 on repeat  Patient can transfer to med floor  Will sign off

## 2016-12-22 LAB — TYPE & SCREEN
ABO/Rh(D): B POS
Antibody screen: NEGATIVE
Status of unit: TRANSFUSED
Status of unit: TRANSFUSED
Status of unit: TRANSFUSED
Status of unit: TRANSFUSED
Unit division: 0
Unit division: 0
Unit division: 0
Unit division: 0

## 2016-12-22 LAB — TYPE AND SCREEN
ABO/Rh: B POS
Antibody Screen: NEGATIVE
Status: TRANSFUSED
Status: TRANSFUSED
Status: TRANSFUSED
Status: TRANSFUSED
Unit Divison: 0
Unit Divison: 0
Unit Divison: 0
Unit Divison: 0

## 2016-12-23 LAB — PERIPHERAL SMEAR

## 2016-12-23 LAB — HAPTOGLOBIN: Haptoglobin: 132 mg/dL (ref 34–200)

## 2016-12-26 ENCOUNTER — Encounter

## 2017-01-01 ENCOUNTER — Inpatient Hospital Stay: Admit: 2017-01-01 | Payer: PRIVATE HEALTH INSURANCE | Attending: Gastroenterology | Primary: Registered Nurse

## 2017-01-01 DIAGNOSIS — I639 Cerebral infarction, unspecified: Secondary | ICD-10-CM

## 2017-01-01 NOTE — Procedures (Signed)
[facility]  *** FINAL REPORT ***    Name: [name] Short, [name] Short  MRN: [id number]    Outpatient  DOB: 31 Jan 1956  HIS Order #: [id number]  TRAKnet Visit #: [id number]  Date: 01 Jan 2017    TYPE OF TEST: Peripheral Venous Testing    REASON FOR TEST  Limb swelling    Right Leg:-  Deep venous thrombosis:           No  Superficial venous thrombosis:    No  Deep venous insufficiency:        Not examined  Superficial venous insufficiency: Not examined    Left Leg:-  Deep venous thrombosis:           No  Superficial venous thrombosis:    No  Deep venous insufficiency:        Not examined  Superficial venous insufficiency: Not examined      INTERPRETATION/FINDINGS  Duplex images were obtained using 2-D gray scale, color flow, and  spectral Doppler analysis.  Right leg :  1. Deep vein(s) visualized include the common femoral, proximal  femoral, mid femoral, distal femoral, popliteal(above knee),  popliteal(fossa), popliteal(below knee), posterior tibial and peroneal   veins.  2. No evidence of deep venous thrombosis detected in the veins  visualized.  3. Superficial vein(s) visualized include the great saphenous vein.  4. No evidence of superficial thrombosis detected.  Left leg :  1. Deep vein(s) visualized include the common femoral, proximal  femoral, mid femoral, distal femoral, popliteal(above knee),  popliteal(fossa), popliteal(below knee), posterior tibial and peroneal   veins.  2. No evidence of deep venous thrombosis detected in the veins  visualized.  3. Superficial vein(s) visualized include the great saphenous vein.  4. No evidence of superficial thrombosis detected.    ADDITIONAL COMMENTS    I have personally reviewed the data relevant to the interpretation of  this  study.    TECHNOLOGIST: Bess KindsEmmanuel Bathelemy, [facility], RVT/  Signed: 01/01/2017 01:56 PM    PHYSICIAN: Vernia BuffMarc A. Lorine Bearsamacho, MD  Signed: 01/01/2017 03:49 PM

## 2017-01-01 NOTE — Procedures (Signed)
[facility]  *** FINAL REPORT ***    Name: [name] Short, [name] Short  MRN: [id number]    Outpatient  DOB: 31 Jan 1956  HIS Order #: [id number]  TRAKnet Visit #: [id number]  Date: 01 Jan 2017    TYPE OF TEST: Peripheral Venous Testing    REASON FOR TEST  Limb swelling    Right Leg:-  Deep venous thrombosis:           No  Superficial venous thrombosis:    No  Deep venous insufficiency:        Not examined  Superficial venous insufficiency: Not examined    Left Leg:-  Deep venous thrombosis:           No  Superficial venous thrombosis:    No  Deep venous insufficiency:        Not examined  Superficial venous insufficiency: Not examined      INTERPRETATION/FINDINGS  Duplex images were obtained using 2-D gray scale, color flow, and  spectral Doppler analysis.  Right leg :  1. Deep vein(s) visualized include the common femoral, proximal  femoral, mid femoral, distal femoral, popliteal(above knee),  popliteal(fossa), popliteal(below knee), posterior tibial and peroneal   veins.  2. No evidence of deep venous thrombosis detected in the veins  visualized.  3. Superficial vein(s) visualized include the great saphenous vein.  4. No evidence of superficial thrombosis detected.  Left leg :  1. Deep vein(s) visualized include the common femoral, proximal  femoral, mid femoral, distal femoral, popliteal(above knee),  popliteal(fossa), popliteal(below knee), posterior tibial and peroneal   veins.  2. No evidence of deep venous thrombosis detected in the veins  visualized.  3. Superficial vein(s) visualized include the great saphenous vein.  4. No evidence of superficial thrombosis detected.    ADDITIONAL COMMENTS    I have personally reviewed the data relevant to the interpretation of  this  study.    TECHNOLOGIST: Emmanuel Bathelemy, [facility], RVT/  Signed: 01/01/2017 01:56 PM    PHYSICIAN: Pranay Hilbun A. Paco Cislo, MD  Signed: 01/01/2017 03:49 PM

## 2017-01-05 ENCOUNTER — Ambulatory Visit
Admission: RE | Admit: 2017-01-05 | Discharge: 2017-01-05 | Disposition: A | Discharge status: Home / Self Care | Payer: Medicare Other | Source: Ambulatory Visit | Attending: Neurology | Admitting: Neurology

## 2017-01-05 DIAGNOSIS — R531 Weakness: Secondary | ICD-10-CM

## 2017-01-05 DIAGNOSIS — R252 Cramp and spasm: Secondary | ICD-10-CM

## 2017-01-05 DIAGNOSIS — G959 Disease of spinal cord, unspecified: Secondary | ICD-10-CM

## 2017-01-05 DIAGNOSIS — M4802 Spinal stenosis, cervical region: Secondary | ICD-10-CM | POA: Diagnosis not present

## 2017-01-05 DIAGNOSIS — R208 Other disturbances of skin sensation: Secondary | ICD-10-CM

## 2017-01-05 DIAGNOSIS — R270 Ataxia, unspecified: Secondary | ICD-10-CM | POA: Diagnosis not present

## 2017-01-05 DIAGNOSIS — M47814 Spondylosis without myelopathy or radiculopathy, thoracic region: Secondary | ICD-10-CM | POA: Diagnosis not present

## 2017-01-13 ENCOUNTER — Inpatient Hospital Stay: Payer: PRIVATE HEALTH INSURANCE

## 2017-01-13 ENCOUNTER — Encounter: Payer: Self-pay | Admitting: *Deleted

## 2017-01-13 LAB — GLUCOSE, POC: Glucose (POC): 103 mg/dL (ref 70–110)

## 2017-01-13 MED ORDER — LACTATED RINGERS IV
INTRAVENOUS | Status: DC
Start: 2017-01-13 — End: 2017-01-13

## 2017-01-13 MED ORDER — PROPOFOL 10 MG/ML IV EMUL
10 mg/mL | INTRAVENOUS | Status: DC | PRN
Start: 2017-01-13 — End: 2017-01-13
  Administered 2017-01-13 (×6): via INTRAVENOUS

## 2017-01-13 MED ORDER — FAMOTIDINE (PF) 20 MG/2 ML IV
20 mg/2 mL | Freq: Once | INTRAVENOUS | Status: AC
Start: 2017-01-13 — End: 2017-01-13
  Administered 2017-01-13: 17:00:00 via INTRAVENOUS

## 2017-01-13 MED ORDER — GLUCAGON 1 MG INJECTION
1 mg | INTRAMUSCULAR | Status: DC | PRN
Start: 2017-01-13 — End: 2017-01-13

## 2017-01-13 MED ORDER — SODIUM CHLORIDE 0.9 % IJ SYRG
Freq: Three times a day (TID) | INTRAMUSCULAR | Status: DC
Start: 2017-01-13 — End: 2017-01-13

## 2017-01-13 MED ORDER — GLUCOSE 4 GRAM CHEWABLE TAB
4 gram | ORAL | Status: DC | PRN
Start: 2017-01-13 — End: 2017-01-13

## 2017-01-13 MED ORDER — LACTATED RINGERS IV
INTRAVENOUS | Status: DC | PRN
Start: 2017-01-13 — End: 2017-01-13
  Administered 2017-01-13: 17:00:00 via INTRAVENOUS

## 2017-01-13 MED ORDER — INSULIN LISPRO 100 UNIT/ML INJECTION
100 unit/mL | Freq: Once | SUBCUTANEOUS | Status: DC
Start: 2017-01-13 — End: 2017-01-13

## 2017-01-13 MED ORDER — SODIUM CHLORIDE 0.9 % IJ SYRG
INTRAMUSCULAR | Status: DC | PRN
Start: 2017-01-13 — End: 2017-01-13

## 2017-01-13 MED ORDER — DEXTROSE 50% IN WATER (D50W) IV SYRG
INTRAVENOUS | Status: DC | PRN
Start: 2017-01-13 — End: 2017-01-13

## 2017-01-13 MED FILL — BD POSIFLUSH NORMAL SALINE 0.9 % INJECTION SYRINGE: INTRAMUSCULAR | Qty: 10

## 2017-01-13 MED FILL — FAMOTIDINE (PF) 20 MG/2 ML IV: 20 mg/2 mL | INTRAVENOUS | Qty: 2

## 2017-01-13 MED FILL — LACTATED RINGERS IV: INTRAVENOUS | Qty: 1000

## 2017-01-13 NOTE — Anesthesia Post-Procedure Evaluation (Signed)
Post-Anesthesia Evaluation and Assessment    Patient: [name] Short MRN: [id number]  SSN: [id number]    Date of Birth: 1956/03/25  Age: 61 y.o.  Sex: female       Cardiovascular Function/Vital Signs  Visit Vitals   ??? BP 134/51   ??? Pulse 72   ??? Temp 36.8 ??C (98.2 ??F)   ??? Resp 12   ??? Ht 5' (1.524 m)   ??? Wt 72.6 kg (160 lb)   ??? SpO2 100%   ??? BMI 31.25 kg/m2       Patient is status post MAC anesthesia for Procedure(s):  UPPER ENDOSCOPY w/ biopsies  COLONOSCOPY.    Nausea/Vomiting: None    Postoperative hydration reviewed and adequate.    Pain:  Pain Scale 1: Numeric (0 - 10) (01/13/17 1226)  Pain Intensity 1: 0 (01/13/17 1226)   Managed    Neurological Status:       At baseline    Mental Status and Level of Consciousness: Arousable    Pulmonary Status:   O2 Device: Nasal cannula (01/13/17 1327)   Adequate oxygenation and airway patent    Complications related to anesthesia: None    Post-anesthesia assessment completed. No concerns    Signed By: Herbert Seta, MD     January 13, 2017

## 2017-01-13 NOTE — Procedures (Signed)
Procedures  by Joycelyn Schmid, MD at 01/13/17 1329                Author: Joycelyn Schmid, MD  Service: Gastroenterology  Author Type: Physician       Filed: 01/13/17 1329  Date of Service: 01/13/17 1329  Status: Signed          Editor: Joycelyn Schmid, MD (Physician)                                [facility] [place] [address]         Brief Procedure Note      [name] Short   1956/03/15   [id number]      Date of Procedure: 01/13/2017      Preoperative diagnosis: 434.91 - I63.9,  Cerebrovascular accident  (2014)   792.1 - R19.5,  Occult blood + stool   280.9 - D50.9,  Iron deficiency anemia   530.10 - K20.9,  Esophagitis grade II   V12.72 - Z86.010, Personal history of colon polyps   401.9 - I10,  Hypertension   250.00 - E11.9,  Diabetes   729.81 - M79.89,  Swelling of limb      Postoperative diagnosis:  EGD: gastric reflux, gastritis   Colo: normal       Type of Anesthesia: MAC (monitered anesthesia care)      Description of Findings: same as post op dx      Procedure: Procedure(s):   UPPER ENDOSCOPY w/ biopsies   COLONOSCOPY      Operator:  Dr. Joycelyn Schmid, MD      Assistant(s): _0 @      Type of Anesthesia:MAC       VQQ:VZDG      Specimens:            ID  Type  Source  Tests  Collected by  Time  Destination             1 : gastric antrum bx  Preservative  Gastric    Joycelyn Schmid, MD  01/13/2017 1307  Pathology     2 : GE junction bx  Preservative  Esophagus    Joycelyn Schmid, MD  01/13/2017 1310  Pathology           Findings: See printed and scanned procedure note      Complications: None      Dr. Joycelyn Schmid, MD   01/13/2017  1:29 PM

## 2017-01-13 NOTE — Anesthesia Pre-Procedure Evaluation (Signed)
Anesthetic History   No history of anesthetic complications            Review of Systems / Medical History  Patient summary reviewed and pertinent labs reviewed    Pulmonary          Smoker         Neuro/Psych       CVA  TIA     Cardiovascular    Hypertension: well controlled              Exercise tolerance: >4 METS     GI/Hepatic/Renal     GERD           Endo/Other    Diabetes: well controlled, type 2    Arthritis     Other Findings   Comments: Documentation of current medication  Current medications obtained, documented and obtained? YES      Risk Factors for Postoperative nausea/vomiting:       History of postoperative nausea/vomiting?  NO       Female?  YES       Motion sickness?  NO       Intended opioid administration for postoperative analgesia?  NO      Smoking Abstinence:  Current Smoker?   YES  Elective Surgery? YES  Seen preoperatively by anesthesiologist or proxy prior to day of surgery?  YES  Pt abstained from smoking 24 hours prior to anesthesia? N/A    Preventive care/screening for High Blood Pressure:  Aged 18 years and older: YES  Screened for high blood pressure: YES  Patients with high blood pressure referred to primary care provider   for BP management: YES                 Physical Exam    Airway  Mallampati: III  TM Distance: 4 - 6 cm  Neck ROM: decreased range of motion   Mouth opening: Diminished (comment)     Cardiovascular    Rhythm: regular  Rate: normal         Dental    Dentition: Poor dentition     Pulmonary  Breath sounds clear to auscultation               Abdominal  GI exam deferred       Other Findings            Anesthetic Plan    ASA: 3  Anesthesia type: MAC            Anesthetic plan and risks discussed with: Patient

## 2017-01-13 NOTE — Procedures (Signed)
[facility] [place] [address]      Brief Procedure Note    [name] Short  1956/04/19  [id number]    Date of Procedure: 01/13/2017    Preoperative diagnosis: 434.91 - I63.9,  Cerebrovascular accident  (2014)  792.1 - R19.5,  Occult blood + stool  280.9 - D50.9,  Iron deficiency anemia  530.10 - K20.9,  Esophagitis grade II  V12.72 - Z86.010, Personal history of colon polyps  401.9 - I10,  Hypertension  250.00 - E11.9,  Diabetes  729.81 - M79.89,  Swelling of limb    Postoperative diagnosis:  EGD: gastric reflux, gastritis  Colo: normal     Type of Anesthesia: MAC (monitered anesthesia care)    Description of Findings: same as post op dx    Procedure: Procedure(s):  UPPER ENDOSCOPY w/ biopsies  COLONOSCOPY    Operator:  Dr. Joycelyn Schmid, MD    Assistant(s): @ORSSTAF @    Type of Anesthesia:MAC     RFX:JOIT    Specimens:   ID Type Source Tests Collected by Time Destination   1 : gastric antrum bx Preservative Gastric  Joycelyn Schmid, MD 01/13/2017 1307 Pathology   2 : GE junction bx Preservative Esophagus  Joycelyn Schmid, MD 01/13/2017 1310 Pathology       Findings: See printed and scanned procedure note    Complications: None    Dr. Joycelyn Schmid, MD  01/13/2017  1:29 PM

## 2017-01-13 NOTE — H&P (Signed)
History and Physical reviewed; I have examined the patient and there are no pertinent changes.    Levan Hursthomas Kazuki Ingle, MD, MD   12:13 PM 01/13/2017  [facility]  [phone]  [email or web address]

## 2017-01-14 MED FILL — LACTATED RINGERS IV: INTRAVENOUS | Qty: 1000

## 2017-01-14 MED FILL — PROPOFOL 10 MG/ML IV EMUL: 10 mg/mL | INTRAVENOUS | Qty: 40

## 2017-01-20 ENCOUNTER — Encounter: Payer: Self-pay | Admitting: Physical Medicine & Rehabilitation

## 2017-01-20 ENCOUNTER — Encounter: Payer: Medicare Other | Attending: Physical Medicine & Rehabilitation

## 2017-01-20 ENCOUNTER — Ambulatory Visit (HOSPITAL_BASED_OUTPATIENT_CLINIC_OR_DEPARTMENT_OTHER): Payer: Medicare Other | Admitting: Physical Medicine & Rehabilitation

## 2017-01-20 VITALS — BP 127/83 | HR 101 | Resp 14

## 2017-01-20 DIAGNOSIS — F17210 Nicotine dependence, cigarettes, uncomplicated: Secondary | ICD-10-CM | POA: Insufficient documentation

## 2017-01-20 DIAGNOSIS — E119 Type 2 diabetes mellitus without complications: Secondary | ICD-10-CM | POA: Diagnosis not present

## 2017-01-20 DIAGNOSIS — I509 Heart failure, unspecified: Secondary | ICD-10-CM | POA: Diagnosis not present

## 2017-01-20 DIAGNOSIS — K219 Gastro-esophageal reflux disease without esophagitis: Secondary | ICD-10-CM | POA: Diagnosis not present

## 2017-01-20 DIAGNOSIS — G8250 Quadriplegia, unspecified: Secondary | ICD-10-CM

## 2017-01-20 DIAGNOSIS — M1990 Unspecified osteoarthritis, unspecified site: Secondary | ICD-10-CM | POA: Diagnosis not present

## 2017-01-20 DIAGNOSIS — E785 Hyperlipidemia, unspecified: Secondary | ICD-10-CM | POA: Insufficient documentation

## 2017-01-20 DIAGNOSIS — Z9889 Other specified postprocedural states: Secondary | ICD-10-CM | POA: Diagnosis not present

## 2017-01-20 DIAGNOSIS — R51 Headache: Secondary | ICD-10-CM | POA: Diagnosis not present

## 2017-01-20 DIAGNOSIS — I10 Essential (primary) hypertension: Secondary | ICD-10-CM | POA: Insufficient documentation

## 2017-01-20 NOTE — Patient Instructions (Signed)
Will increase gastroc dose and reduce wrist flexor dose next visit
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
## 2017-02-09 ENCOUNTER — Ambulatory Visit: Payer: Medicare Other | Admitting: Neurology

## 2017-02-26 ENCOUNTER — Other Ambulatory Visit: Payer: Self-pay | Admitting: Neurology

## 2017-02-26 DIAGNOSIS — M4712 Other spondylosis with myelopathy, cervical region: Secondary | ICD-10-CM

## 2017-02-27 NOTE — Telephone Encounter (Signed)
Rx printed, signed and faxed to pharmacy. 

## 2017-03-04 ENCOUNTER — Ambulatory Visit: Payer: Medicare Other | Admitting: Neurology

## 2017-03-04 NOTE — Progress Notes (Deleted)
Pt called and r/s appt to Sept. Does not require no-show fee per Dr. Jaynee Eagles.

## 2017-03-05 ENCOUNTER — Encounter: Payer: Self-pay | Admitting: Neurology

## 2017-03-09 ENCOUNTER — Ambulatory Visit (HOSPITAL_BASED_OUTPATIENT_CLINIC_OR_DEPARTMENT_OTHER): Payer: Medicare Other | Admitting: Physical Medicine & Rehabilitation

## 2017-03-09 ENCOUNTER — Encounter: Payer: Self-pay | Admitting: Physical Medicine & Rehabilitation

## 2017-03-09 ENCOUNTER — Encounter: Payer: Medicare Other | Attending: Physical Medicine & Rehabilitation

## 2017-03-09 VITALS — BP 134/85 | HR 91

## 2017-03-09 DIAGNOSIS — F17210 Nicotine dependence, cigarettes, uncomplicated: Secondary | ICD-10-CM | POA: Insufficient documentation

## 2017-03-09 DIAGNOSIS — E785 Hyperlipidemia, unspecified: Secondary | ICD-10-CM | POA: Diagnosis not present

## 2017-03-09 DIAGNOSIS — Z9889 Other specified postprocedural states: Secondary | ICD-10-CM | POA: Insufficient documentation

## 2017-03-09 DIAGNOSIS — M1990 Unspecified osteoarthritis, unspecified site: Secondary | ICD-10-CM | POA: Insufficient documentation

## 2017-03-09 DIAGNOSIS — G8250 Quadriplegia, unspecified: Secondary | ICD-10-CM | POA: Diagnosis not present

## 2017-03-09 DIAGNOSIS — R51 Headache: Secondary | ICD-10-CM | POA: Insufficient documentation

## 2017-03-09 DIAGNOSIS — I10 Essential (primary) hypertension: Secondary | ICD-10-CM | POA: Insufficient documentation

## 2017-03-09 DIAGNOSIS — E119 Type 2 diabetes mellitus without complications: Secondary | ICD-10-CM | POA: Insufficient documentation

## 2017-03-09 DIAGNOSIS — I509 Heart failure, unspecified: Secondary | ICD-10-CM | POA: Diagnosis not present

## 2017-03-09 DIAGNOSIS — K219 Gastro-esophageal reflux disease without esophagitis: Secondary | ICD-10-CM | POA: Insufficient documentation

## 2017-03-09 NOTE — Progress Notes (Signed)
Dysport Injection for spasticity using needle EMG guidance  Dilution: 200 Units/ml Indication: Severe spasticity which interferes with ADL,mobility and/or  hygiene and is unresponsive to medication management and other conservative care Informed consent was obtained after describing risks and benefits of the procedure with the patient. This includes bleeding, bruising, infection, excessive weakness, or medication side effects. A REMS form is on file and signed. Needle:  needle electrode Number of units per muscle Pectoralis 400 FDS 200 biceps, 200  FCR, 200 Pronator teres 100  Gastroc 400 All injections were done after obtaining appropriate EMG activity and after negative drawback for blood. The patient tolerated the procedure well. Post procedure instructions were given. A followup appointment was made.

## 2017-03-09 NOTE — Patient Instructions (Signed)
You received a Dysport injection today. You may experience muscle pains and aches. He may apply ice 20 minutes every 2 hours as needed for the next 24-48 hours. He also noticed bleeding or bruising in the areas that were injected. May apply Band-Aid. If this bruising is extensive, please notify our office. If there is evidence of increasing redness that occurs 2-3 days after injection. Please call our office. This could be a sign of infection. It is very rare, however. You may experience some muscle weakness in the muscles and injected. This would likely start in about one week.  

## 2017-03-10 ENCOUNTER — Ambulatory Visit: Payer: Medicare Other | Admitting: Physical Medicine & Rehabilitation

## 2017-04-20 ENCOUNTER — Encounter: Payer: Self-pay | Admitting: Physical Medicine & Rehabilitation

## 2017-04-20 ENCOUNTER — Ambulatory Visit (HOSPITAL_BASED_OUTPATIENT_CLINIC_OR_DEPARTMENT_OTHER): Payer: Medicare Other | Admitting: Physical Medicine & Rehabilitation

## 2017-04-20 ENCOUNTER — Encounter: Payer: Medicare Other | Attending: Physical Medicine & Rehabilitation

## 2017-04-20 VITALS — BP 142/87 | HR 102

## 2017-04-20 DIAGNOSIS — G8250 Quadriplegia, unspecified: Secondary | ICD-10-CM | POA: Insufficient documentation

## 2017-04-20 DIAGNOSIS — E785 Hyperlipidemia, unspecified: Secondary | ICD-10-CM | POA: Insufficient documentation

## 2017-04-20 DIAGNOSIS — M1990 Unspecified osteoarthritis, unspecified site: Secondary | ICD-10-CM | POA: Insufficient documentation

## 2017-04-20 DIAGNOSIS — Z9889 Other specified postprocedural states: Secondary | ICD-10-CM | POA: Diagnosis not present

## 2017-04-20 DIAGNOSIS — I10 Essential (primary) hypertension: Secondary | ICD-10-CM | POA: Diagnosis not present

## 2017-04-20 DIAGNOSIS — I509 Heart failure, unspecified: Secondary | ICD-10-CM | POA: Diagnosis not present

## 2017-04-20 DIAGNOSIS — K219 Gastro-esophageal reflux disease without esophagitis: Secondary | ICD-10-CM | POA: Insufficient documentation

## 2017-04-20 DIAGNOSIS — M7062 Trochanteric bursitis, left hip: Secondary | ICD-10-CM

## 2017-04-20 DIAGNOSIS — F17210 Nicotine dependence, cigarettes, uncomplicated: Secondary | ICD-10-CM | POA: Diagnosis not present

## 2017-04-20 DIAGNOSIS — R51 Headache: Secondary | ICD-10-CM | POA: Insufficient documentation

## 2017-04-20 DIAGNOSIS — E119 Type 2 diabetes mellitus without complications: Secondary | ICD-10-CM | POA: Insufficient documentation

## 2017-04-20 NOTE — Progress Notes (Signed)
Trochanteric bursa injection without ultrasound guidance  Indication Trochanteric bursitis. Exam has tenderness over the greater trochanter of the hip. Pain has not responded to conservative care such as exercise therapy and oral medications. Pain interferes with sleep or with mobility Informed consent was obtained after describing risks and benefits of the procedure with the patient these include bleeding bruising and infection. Patient has signed written consent form. Patient placed in a lateral decubitus position with the affected hip superior. Point of maximal pain was palpated marked and prepped with Betadine and entered with a needle to bone contact. Needle slightly withdrawn then 6mg  of betamethasone with 4 cc 1% lidocaine were injected. Patient tolerated procedure well. Post procedure instructions given.

## 2017-04-21 ENCOUNTER — Ambulatory Visit (INDEPENDENT_AMBULATORY_CARE_PROVIDER_SITE_OTHER): Payer: Medicare Other | Admitting: Neurology

## 2017-04-21 ENCOUNTER — Encounter: Payer: Self-pay | Admitting: Neurology

## 2017-04-21 VITALS — BP 140/94 | HR 104 | Ht 63.0 in | Wt 211.0 lb

## 2017-04-21 DIAGNOSIS — R7309 Other abnormal glucose: Secondary | ICD-10-CM | POA: Diagnosis not present

## 2017-04-21 DIAGNOSIS — G834 Cauda equina syndrome: Secondary | ICD-10-CM

## 2017-04-21 DIAGNOSIS — M48062 Spinal stenosis, lumbar region with neurogenic claudication: Secondary | ICD-10-CM

## 2017-04-21 DIAGNOSIS — R29898 Other symptoms and signs involving the musculoskeletal system: Secondary | ICD-10-CM | POA: Diagnosis not present

## 2017-04-21 DIAGNOSIS — R339 Retention of urine, unspecified: Secondary | ICD-10-CM

## 2017-04-21 DIAGNOSIS — G9519 Other vascular myelopathies: Secondary | ICD-10-CM

## 2017-04-21 NOTE — Patient Instructions (Addendum)
Remember to drink plenty of fluid, eat healthy meals and do not skip any meals. Try to eat protein with a every meal and eat a healthy snack such as fruit or nuts in between meals. Try to keep a regular sleep-wake schedule and try to exercise daily, particularly in the form of walking, 20-30 minutes a day, if you can.   As far as diagnostic testing: MRI lumbar spine, Labs, emg/ncs  I would like to see you back for emg/ncs, sooner if we need to. Please call us with any interim questions, concerns, problems, updates or refill requests.   Our phone number is [phone]. We also have an after hours call service for urgent matters and there is a physician on-call for urgent questions. For any emergencies you know to call 911 or go to the nearest emergency room

## 2017-04-21 NOTE — Progress Notes (Signed)
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   She has pain in the right leg at the knee and shin. She continues to have weakness her legs and not her arms. She feels weak in the shins and ankles. She doesn't feel like she has strength to push herself up. She doesn't have that spring, feels like she falls down. She is having more proximal weakness, difficulty getting out of low seats.  Difficulty getting up out of low seats. Cannot walk without having to sit after a certain amount of time.  Interval history 12/11/2016: She has a warm hot sensation from the waist down. Radiates from the lateral abdomen to the legs. Feels like a dry heat. Intense heat. Progressive. More weakness in the left leg. Left side worse than the right side. She denies back pain. She has had falls. The sensation is disturbing. She has urinated on herself. Like putting icy hot on her skin. Lyrica is not helping. Progressive weakness and stiffness. She has fallen. Very unsteady, ataxic. Progressively worsening.  Within the last few months. Need MRI cervical spine and thoracic spine  Interval history 07/15/2016: She is doing well. She continues to get botox in the left arm, switching to dysport soon. Spasms are improved. She has swelling in both legs. She is walking well with a cane, she fell the other day she got up too fast as as she was walking she stumbled in the hallway and she fell on the floor. This is the first fall, she became off balance because she had to go to the bathroom and going too fast. No pain. She has swelling and numbness. No pain. She still takes lyrica. She does not need baclofen or benzodiazepines. She feels the botox has significantly helped. Son feels like she is better, she is moving faster, he doesn't have to helpe her in and out of the car as much. They got her a lift chair. They may  start physical therapy after the next botox treatment. She would also benefit from occupational in addition to physical therapy for help with use of the left hand especially with cooking.   Interval history 01/10/2016: Patient feels like she has been doing excellent. She is walking much better, she loves physical therapy, she is getting better. Botox with Dr. Dianna Limbo is significantly helping with range of motion and pain in her left arm. She is working with Benjie Karvonen Hobble in physical therapy. Using a cane but can walk without it. She is doing great with botox. Lyrica helps at night. Tizanidine helps in the morning takes it once a day with breakfast for spasticity but doesn't want to take any more than that due to side effects. She still has difficulty due to her weakness and spasticity with changing positions in bed, and this week she had an irritation on her back which was treated concerning for skin erosion. Discussed mattresses that can help prevent skin erosion and ulcers. Will request a mattress to try and avoid bed sores. She has a irritation in the lower back now healing, worried about bed sores and will try and request a full size mattress to address this problem. Will talk to [facility]. Patient is here with her son as well. Her weight has stabilized. Son provides information. Had a long talk about what we can do in the future including continuing Botox, continued physical therapy, stretching and strengthening, other  medications to help with spasticity and pain, and most important mattress to help address and prevent possible bed sores. Physical therapy ordered an orthosis for her left foot due to her abnormal gait because of foot inversion due to myelopathy, we'll discuss with Dr. Read Drivers and possibly he can help with Botox..   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 She is doing exceptionally well. The spasticity is improving and so it the pain. She still has balance problems but is up walking around with a cane, no falls. Have recommended baclofen for the spasticity but she is worried about taking it. I also recommend Rehab with Dr. Letta Pate and Naaman Plummer as well as botox for the left arm spasticity. The weight loss has stabilized. She needs podiatry for her toe nail problems. I am extremely pleased with her progress after surgery with Dr. Rigoberto Noel at [facility], a remarkable improvement. She has some spasms in the left arm and both legs. The spasms are less with the Lyrica. The Baclofen didn't help.Her right leg has gotten weaker. She lives with her son who cares for her. Sleeping well at night. She has lost weight. She has spasms mostly in the left arm and mostly in the hand. She needs PT, there was an error in the referral to [facility] and Naaman Plummer which has been corrected. Discussed spasticity with patient. Have prescribed baclofen for patient but she is not interested in taking it, even though it can help with her spasticity. Discussed increasing baclofen and she declines, it makes her sleepy and difficult to walk. She is urinating fine, but bowel movements are difficult  Interval update 04/15/2015: Julie Elliott is a 61 y.o. female here as a referral from Dr. Arcelia Jew for progressive gait ataxia, left hand pain and weakness. Julie Elliott is a 61yo woman with PMHx of HTN, systolic CHF, GERD, Type 2 DM, and hyperlipidemia who presents today for the following: Progressive spasticity, weakness, falls, painful paresthesias unintentional significant weight loss of over 100 pounds, decreased mobility. MRI of the cervical cord is significant for T2 hyperintense lesion extending from C4-C7 with focal spinal cord enhancement at C5-C6. She has gone through extensive workup and returns today to review, with son. Repeat MRI of the cervical spine, see below,  consistent with compressive myelopathy of the spinal cord due to spondylosis.She is gone through extensive testing (see below) including a nuclear medicine PET scan whose findings were largely unremarkable except for asymmetric metabolic activity in the RIGHT vallecula region of the hypopharynx para the RIGHT (will send for direct visualization to rule out head and neck malignancy with Dr. Ernesto Rutherford), CT of the chest abdomen and pelvis were largely unremarkable and no malignancy was found. Extensive serum testing includes the following unremarkable labs: CMP, CBC, TSH, sedimentation rate, HIV with negative reflex, ANA with rheumatologic comprehensive panel, vitamin B1, heavy metals in the blood, pan-ANCA panel, multiple myeloma panel, vitamin B6, rheumatoid factor, paraneoplastic profile, high sensitivity CRP, hep C antibody, RPR,, B12 and folate, angiotensin-converting enzyme, hemoglobin A1c, CK, NMO, copper, ceruloplasmin, HTLV-1 and 2 antibodies, JCV,   CSF studies were completed which include normal myelin basic protein, positive CSF oligoclonal pans greater than 5 that are not present in the patient's corresponding serum sample,negative Lyme antibodies, normal CNS IgG index, negative Echo virus antibodies, a high index of varus sellar zoster IgG in  the CSF was found at 2518 (upper limit normal of 135) however these antibodies were also found in an higher quantity the serum and so this is not consistent with intrathecal production, negative Epstein-Barr PCR, negative VDRL, negative CMV PCR, negative HSV PCR, still pending VZV DNA PCR, fungus culture with smear in progress, cell count with differential unremarkable, normal glucose and elevated protein.   Repeat MRI cervical cord: FINDINGS: :  On sagittal images, the spine is imaged from above the cervicomedullary junction to T3T4. There is increased signal within the spinal cord from C4-C5 to C7 on T2-weighted and STIR images. There is focal  enlargement of the spinal cord adjacent to C5C6 and C6. There is relatively flat enhancement within this region only adjacent to C5C6.   The vertebral bodies are normally aligned. The vertebral bodies have normal signal. The discs and interspaces were further evaluated on axial views from C2 to T1 as follows: C2 - C3: The disc and interspace appear normal. C3 -C4: There is mild uncovertebral spurring and disc bulging causing moderate bilateral foraminal narrowing. There is no definite nerve root compression, but there is some encroachment of the exiting C4 nerve roots. C4 -C5: There is mild bilateral uncovertebral spurring and imaging causing severe bilateral foraminal narrowing. There could be compression of either of the C5 nerve roots. Posteriorly within the spinal cord, there is hyperintense signal on T2 images extending inferiorly.  C5 -C6: There is moderate spinal stenosis due to broad disc protrusion, uncovertebral spurring and facet hypertrophy. There is severe left and very severe right foraminal narrowing leading to right C6 and possible left C6 nerve root compression. The entire spinal cord is hyperintense C6 -C7: There is broad disc protrusion and facet hypertrophy causing severe left and moderate right foraminal narrowing. There is possible left C7 nerve root compression. Abnormal signal is seen in the entire spinal cord at the interspace and medially adjacent to the C7 vertebral body.  C7 -T1: The disc and interspace appear normal. Hyperintense signal is noted medially and anteriorly only, within the spinal cord T1 - T2: The disc and interspace appear normal.   Compared to the MRI dated 03/13/2015, there is no definite change.   IMPRESSION: This is an abnormal MRI of the cervical spine with and without contrast showing: 1. Abnormal T2 hyperintense signal within the spinal cord from C4C5 to C7 with mild expansion of the cord adjacent to C5C6  to C6. There is "pancake-like" flat enhancement within the spinal cord only adjacent to C5C6. The findings are stable compared to 03/13/2015. 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 Inflammatory, demyelinating and neoplastic etiologies remain in the differential diagnosis 2. At [place], there is severe foraminal narrowing bilaterally that could lead to compression of either c5 nerve root. 3. At C5C6, there is moderate spinal stenosis and very severe right and severe left foraminal narrowing. There is probable right and possible left C6 nerve root compression.  4. At C6C7, there is severe left foraminal narrowing with possible left C7 nerve root compression  An extensive workup has been completed on patient without another etiology found except for possible compressive spondylosis of the spine. Workup consisted of  Nuclear medicine PET image: FINDINGS: NECK There is increased activity within the spinal cord from the skullbase through C7. There is focal activity at C5-C6 which corresponds to the lesion  on comparison MRI. No hypermetabolic cervical lymphadenopathy.   There is asymmetric metabolic activity within the RIGHT vallecula compared to the LEFT (image 24 of the fused data set). This activity is intense with SUV max equal 11.6 compared to prior wall 7.0 on the contralateral side. No discrete mass lesions identified.   Here is a focus of activity at the LEFT small first rib and clavicle junction (image 48 of fused data set) without associated finding on the CT portion.  CHEST  No hypermetabolic mediastinal or hilar nodes. 4 mm on palm lung nodule along the RIGHT oblique fissure on image (image 25 with, series 6).  ABDOMEN/PELVIS  No abnormal hypermetabolic activity within the liver, pancreas,  adrenal glands, or spleen. No hypermetabolic lymph nodes in the abdomen or pelvis. Nonobstructing calculus in the LEFT kidney measures 7 mm. Uterus and ovaries are normal.  SKELETON  No focal hypermetabolic activity to suggest skeletal metastasis.  IMPRESSION: 1. Hypermetabolic focus within the spinal cord corresponds to enhancing lesion on comparison cervical MRI. Differential includes inflammatory process, infectious process, or neoplastic process.). 2. Asymmetric metabolic activity in the RIGHT vallecula region of the hypopharynx para the RIGHT. Consider direct visualization if concern for head and neck malignancy. 3. Asymmetric uptake at the junction of the first rib LEFT clavicle is likely degenerative as there is no lesion identified on comparison CT 4. Small nodule along the RIGHT oblique fissure is likely benign. If the patient is at high risk for bronchogenic carcinoma, follow-up chest CT at 1 year is recommended. If the patient is at low risk, no follow-up is needed. This recommendation follows the consensus statement: Guidelines for Management of Small Pulmonary Nodules Detected on CT Scans: A Statement from the [place] as published in Radiology 2005; 237:395-400.  CT ABDOMEN PELVIS FINDINGS  Hepatobiliary: Cholecystectomy.  Pancreas: Unremarkable  Spleen: Unremarkable  Adrenals/Urinary Tract: 6 mm left mid kidney nonobstructive renal calculus.  Stomach/Bowel: Small periampullary duodenal diverticulum.  Vascular/Lymphatic: Mild aortoiliac atherosclerotic calcification.  Reproductive: Unremarkable  Other: No supplemental non-categorized findings.  CT of the chest, abdomen and pelvis with contrast  Musculoskeletal: Considerable lower lumbar spondylosis and degenerative disc disease thought to be causing impingement at ultiple lumbar levels but most particularly at L5-S1 and L4-5. This appears significantly progressive compared to the exam from  09/14/2006. High density along the anterior epidural tissues at the L5 level probably from disc material or venous plexus. Spurring of both hips noted.  IMPRESSION: 1. 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 2. The 2-3 mm right upper lobe subpleural lymph node, unchanged from 09/05/2014. This documents 6 months of stability. If the patient is at high risk for bronchogenic carcinoma, follow-up chest CT in 6 months is recommended. If the patient is at low risk, no follow-up is needed. This recommendation follows the consensus statement: Guidelines for Management of Small Pulmonary Nodules Detected on CT Scans: A Statement from the [place] as published in Radiology 2005; 237:395-400. 3. 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 4. 6 mm nonobstructive left mid kidney renal calculus. 5. Mild atherosclerosis.  MR Brain:  IMPRESSION: Abnormal MRI scan of the brain showing tiny scattered periventricular and subcortical nonspecific white matter hyperintensities with differential discussed above. No enhancing lesions are noted.  MRI Thoracic spine; Unremarkable  Interval update 03/15/2015: Julie Elliott is a 61 y.o. female here as a referral from Dr. Arcelia Jew for progressive gait ataxia, left  hand pain and weakness. Julie Elliott is a 61yo woman with PMHx of HTN, systolic CHF, GERD, Type 2 DM, and hyperlipidemia who presents today for the following: Progressive spasticity, weakness, falls, painful paresthesias unintentional significant weight loss decreased mobility. She was scheduled for an EMG nerve conduction today however MRI of the cervical cord today came back significant for T2 hyperintense lesion extending from C4-5 to inferior C7 level with focal spinal cord enhancement at C5-6 level. The spinal cord appears focally enlarged at the C6 level. May represent focal autoimmune,  inflammatory, demyelinating, neoplastic or para-infectious etiologies. There is adjacent degenerative disc disease with mild spinal stenosis, and therefore compressive etiology may be considered. However the degree of abnormal cord signal appears to be out of proportion to the degree of compression, and therefore would consider workup of other etiologies.  Discussed at length with patient and son and further workup.  Interval update 03/01/2015: Julie Elliott is a 61 y.o. female here as a follow up. Here with her son.   She is still losing weight. She lost 12-14 pounds in a 2 week period. She is losing muscle mass now. She can't stand up. She can't walk for a long period of time. Her PT and OT was discontinued. Patient reports that they told her they were wasting her time. No incontinence. No problems swallowing. No PBA. No weakness in facial muscles. No muscle cramping. Still with left-sided arm pain and weakness. Continuous pain. Stiffness in the legs.   Initial visit 12/21/2014: Julie Elliott is a 61 y.o. female here as a referral from Dr. Arcelia Jew for progressive gait ataxia, left hand pain and weakness. Julie Elliott is a 61yo woman with PMHx of HTN, systolic CHF, GERD, Type 2 DM, and hyperlipidemia who presents today for the following:  She is having problems with the left hand. She has joint pain and out of nowhere the hand locked up. Stiffness, arthritic-type pain. Doesn't stop when she sleeps, the pain is continuous all day lone. Progressing and worsening. Left leg is weak. Also burning in the left leg. And coldness in both legs. They were in the ED January first, she felt like she couldn't move. Her legs were cold. She has lost a lot of weight quickly, over 100 pounds in the last 6 months. She eats the same, good appetite. she was told that her symptoms were related to arthritis and diabetic neuropathy. One month later they were in the ED again, decreased mobility and ataxia. Now she needs a cane to walk.  Issues with balance and near falls. She has developed a frozen shoulder. Progressive, incapaciting pain 10/10 with difficulty performing daily tasks such as cleaning, toileting, bathing, cooking. Symptoms are mostly on the left side but her right leg is now affected. Assymmetric. Right leg slightly involved. Weakness as well as pain mostly on the left. She gets very hot. The left hand symptoms were acute, the other symptoms have been insidious and have slowly progressed. She has to have someone bathe her now. Balance is poor. She has not fallen but very off balance. No injuries, no inciting factors. She had stoped taking her insulin in December because of her weight loss she no longer needs it. Son is resent with her and provides much of the information.   EMG/NCS showed severe CTS. Had emg/ncs at [facility] orthopaedics.   Reviewed notes, labs and imaging from outside physicians, which showed:  DG Lumbar spine (reviewed images and agree with the following) : No evidence for fracture. Loss  of disc height is seen at L3-4 and L5-S1 advanced facet osteoarthritis is seen bilaterally in the lower lumbar levels. No worrisome lytic or sclerotic osseous abnormality.  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  DG Cervical Spine: The cervicothoracic junction is partially obscured by osseous and soft tissue overlap on the lateral view. Cervical spine alignment is maintained. Vertebral body heights are preserved. There is disc space narrowing at C4-C5, C5-C6, and C6-C7 with associated endplate spurs. There is bony neural foraminal narrowing at C5-C6 bilaterally. The dens is intact. Posterior elements appear well-aligned. There is no evidence of fracture. No prevertebral soft tissue edema.  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 Bony neural foraminal narrowing at C5-C6. No acute bony abnormality.  DG Hip: There is no evidence of fracture. There is  osteoarthritis of both hips with mild joint space narrowing and marginal osteophytes. Sacroiliac joints appear normal. The patient has a tendency towards calcification at tendon insertions.  IMPRESSION: No acute finding. Osteoarthritis of both hips.  DG Left hand complete: Evaluation of the digits is limited due to flexed positioning. No fracture or dislocation. Mild osteoarthritis of the thumb and index inger metacarpal phalangeal joints with joint space narrowing and osteophytes. The joint spaces are otherwise maintained. There are no erosions or periosteal reaction. No focal soft tissue abnormality.  DG Shoulder: Mild osteoarthritis. No evidence of inflammatory arthropathy or acute bony abnormality.  No fracture or dislocation. The alignment and joint spaces are maintained. There is mild osteoarthritis at the acromioclavicular joint. There is inferior spurring from the glenoid. No definite abnormal soft tissue calcifications.  IMPRESSION: Acromioclavicular and glenohumeral osteoarthritis. No acute bony abnormality.  She was seen in the ED in February for shoulder pain and limitations in movement. She was referred to Orthopaedics. Per notes, She also has hx of leg pain, knee pain, and hip pain which has been attributed to diabetic neuropathy and arthritis in the past. Has unbalance. CXR hip showed OA of both hips and mild joint space narrowing and osteophytes. SI joint was normal. Lumbar spine, degeneration disc And facet disease onf L3-L4, L5-S1. 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 Exam noted Patient's legs are cool to the touch. Bilateral ABI was within normal limits. Dxed with likely peripheral neuropathy from her diabetes and possible decreased arterial blood  flow to the legs.  HgbA1c 6.8 06/2014   Social History   Social History  . Marital status: Single    Spouse name: N/A  . Number of children: 3  . Years of education: 12   Occupational History  . Unemployed    Social History Main Topics  . Smoking status: Current Some Day Smoker    Types: Cigarettes    Last attempt to quit: 04/18/2013  . Smokeless tobacco: Current User     Comment: 1-2 cigs in the a.m.  Marland Kitchen Alcohol use No  . Drug use: No  . Sexual activity: Not on file   Other Topics Concern  . Not on file   Social History Narrative   to get supplies nfrom [facility] per pt request.1--[phone]   Lives at home with son.    Right handed.   Caffeine use: drinks coffee-rare    Family History  Problem Relation Age of Onset  .  Diabetes Mother   . Heart disease Mother   . Prostate cancer Father   . Neuropathy Neg Hx     Past Medical History:  Diagnosis Date  . CHF (congestive heart failure) ([place])   . Chronic headaches   . DJD (degenerative joint disease)   . DM (diabetes mellitus) ([place]) 2008   stable HgBA1C at 6.5  . GERD (gastroesophageal reflux disease)    well controlled on Omeprazole  . HLD (hyperlipidemia) 2008   stable, well controlled  . Hypertension    well controlled    Past Surgical History:  Procedure Laterality Date  . carpel tunnel Right    20 yrs ago  . CERVICAL SPINE SURGERY  2016  . TUBAL LIGATION      Current Outpatient Prescriptions  Medication Sig Dispense Refill  . aspirin 81 MG EC tablet Take 1 tablet (81 mg total) by mouth daily. 90 tablet 1  . LYRICA 75 MG capsule TAKE 1 CAPSULE IN THE MORNING, 1 CAPSULE IN THE EVENING AND 2 AT BEDTIME 120 capsule 5  . senna-docusate (SENOKOT-S) 8.6-50 MG tablet Take 2 tablets by mouth.     No current facility-administered medications for this visit.     Allergies as of 04/21/2017 - Review Complete 04/21/2017  Allergen Reaction Noted  . Loratadine-pseudoephedrine er Hives     Vitals: BP  (!) 140/94   Pulse (!) 104   Ht '5\' 3"'  (1.6 m)   Wt 211 lb (95.7 kg)   BMI 37.38 kg/m  Last Weight:  Wt Readings from Last 1 Encounters:  04/21/17 211 lb (95.7 kg)   Last Height:   Ht Readings from Last 1 Encounters:  04/21/17 '5\' 3"'  (1.6 m)   Physical exam: Exam: Gen: NAD, conversant Eyes: Conjunctivae clear without exudates or hemorrhage  Neuro: Detailed Neurologic Exam  Speech:    Speech is normal; fluent and spontaneous with normal comprehension.  Cognition:    The patient is oriented to person, place, and time;   Cranial Nerves:    The pupils are equal, round, and reactive to light.  Visual fields are full to finger confrontation. Extraocular movements are intact. Trigeminal sensation is intact and the muscles of mastication are normal. The face is symmetric. The palate elevates in the midline. Hearing intact. Voice is normal. Shoulder shrug is normal. The tongue has normal motion without fasciculations.   Gait:    Cannot get out of chair independently, needs assistance, wide based, imbalance  Posture:    Posture is slightly stooped    Strength:    Strength is 5/5 in the upper extremities, .proximal > distal weakness of the lower extremities right > left        Assessment/Plan: Julie Elliott is a wonderful 60y.o. female here as a referral from Dr. Arcelia Jew for progressive gait ataxia and spasticity, left hand pain and paresis, lower extremity bilateral spastic paresis. Progressive spasticity, weakness, falls, painful paresthesias unintentional significant weight loss of over 100 pounds ,decreased mobility. Exam with decreased sensation distally, weakness necessitating wheelchair, increased tone in the lower extremities with left upgoing toe, very brisk reflexes for age and medical conditions. 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  - Patient has worsening lower extremity weakness, back pain, radiculopathy, claudication, bowel retention  suspicious for lumber stenosis. Need MRI of the lumbar spine to evaluate for stenosis or cauda equina syndrome. EMG/NCS of one arm and one leg.   - Continue with Dr. Letta Pate and Dr. Naaman Plummer for physical rehabilitation  as well as Dysportfor spasticity.   Sarina Ill, MD  [facility] [address] [place], [place] [address]  Phone [phone] Fax [phone]  A total of 25 minutes was spent face-to-face with this patient. Over half this time was spent on counseling patient on the spinal stenosis diagnosis and different diagnostic and therapeutic options available.

## 2017-04-28 ENCOUNTER — Encounter: Payer: Self-pay | Admitting: Internal Medicine

## 2017-04-28 ENCOUNTER — Ambulatory Visit (INDEPENDENT_AMBULATORY_CARE_PROVIDER_SITE_OTHER): Payer: Medicare Other | Admitting: Internal Medicine

## 2017-04-28 VITALS — BP 128/71 | HR 87 | Temp 97.9°F | Ht 63.0 in | Wt 212.4 lb

## 2017-04-28 DIAGNOSIS — Z1231 Encounter for screening mammogram for malignant neoplasm of breast: Secondary | ICD-10-CM

## 2017-04-28 DIAGNOSIS — M25612 Stiffness of left shoulder, not elsewhere classified: Secondary | ICD-10-CM

## 2017-04-28 DIAGNOSIS — G8250 Quadriplegia, unspecified: Secondary | ICD-10-CM

## 2017-04-28 DIAGNOSIS — M25559 Pain in unspecified hip: Secondary | ICD-10-CM

## 2017-04-28 DIAGNOSIS — I10 Essential (primary) hypertension: Secondary | ICD-10-CM

## 2017-04-28 DIAGNOSIS — F17210 Nicotine dependence, cigarettes, uncomplicated: Secondary | ICD-10-CM | POA: Diagnosis not present

## 2017-04-28 DIAGNOSIS — Z79899 Other long term (current) drug therapy: Secondary | ICD-10-CM | POA: Diagnosis not present

## 2017-04-28 DIAGNOSIS — G629 Polyneuropathy, unspecified: Secondary | ICD-10-CM | POA: Diagnosis not present

## 2017-04-28 DIAGNOSIS — Z0000 Encounter for general adult medical examination without abnormal findings: Secondary | ICD-10-CM

## 2017-04-28 DIAGNOSIS — M7989 Other specified soft tissue disorders: Secondary | ICD-10-CM | POA: Diagnosis not present

## 2017-04-28 DIAGNOSIS — M6281 Muscle weakness (generalized): Secondary | ICD-10-CM | POA: Diagnosis not present

## 2017-04-28 DIAGNOSIS — M5000 Cervical disc disorder with myelopathy, unspecified cervical region: Secondary | ICD-10-CM

## 2017-04-28 NOTE — Assessment & Plan Note (Signed)
States her symptoms are doing well and has been pleased with the results of dysport injections. She did have a new type of hip pain for which she received an additional injection and is also being further evaluated by her neurologist with a pending L Spine MRI and nerve conduction studies to investigate other causes given her history of spinal cord disease at the cervical level. These tests are already scheduled and we will follow the results.   --Continue Lyrica

## 2017-04-28 NOTE — Progress Notes (Signed)
   CC: Follow up of chronic medical conditions   HPI:  Julie Elliott is a 61 y.o. F with past medical history as described below who presents to the clinic for follow-up of chronic medical conditions.  Spastic quadriparesis and peripheral neuropathy: She has a history of peripheral neuropathy and muscle spasms primarily to her L upper and lower extremities due to a history of cervical cord compression. She states the Lyrica has provided some relief and she decided not to stop taking it. She is seen regularly by PM&R and Neurology for these issues, recently evaluated by Neurology who recommended further testing for hip complaints and decreased mobility. However, overall, she feels she is doing better than she has previously and is pleased with her progress.   Tobacco use: She has cut back on her smoking, down to about 1/3rd of a pack per day. She attributes this to losing the taste for cigarettes. She notes that she can completely stop and feels she can gradually and naturally do this without further assistance.     Past Medical History:  Diagnosis Date  . CHF (congestive heart failure) ([place])   . Chronic headaches   . DJD (degenerative joint disease)   . DM (diabetes mellitus) ([place]) 2008   stable HgBA1C at 6.5  . GERD (gastroesophageal reflux disease)    well controlled on Omeprazole  . HLD (hyperlipidemia) 2008   stable, well controlled  . Hypertension    well controlled   Review of Systems:  Review of Systems  Constitutional: Negative for fever and weight loss.  Respiratory: Negative for shortness of breath.   Cardiovascular: Positive for leg swelling.  Musculoskeletal: Negative for falls.     Physical Exam:  Vitals:   04/28/17 1516  BP: 128/71  Pulse: 87  Temp: 97.9 F (36.6 C)  TempSrc: Oral  SpO2: 99%  Weight: 212 lb 6.4 oz (96.3 kg)  Height: 5\' 3"  (1.6 m)   Physical Exam  Constitutional: She is oriented to person, place, and time.  Sitting in chair comfortably  in no distress   HENT:  Head: Normocephalic and atraumatic.  Cardiovascular: Normal rate and regular rhythm.   Pulmonary/Chest: Effort normal and breath sounds normal. No respiratory distress. She has no wheezes.  Musculoskeletal:  Limited ROM of L shoulder, no tenderness to palpation of extremities   Neurological: She is alert and oriented to person, place, and time.    Assessment & Plan:   See Encounters Tab for problem based charting.  Patient seen with Dr. Evette Doffing

## 2017-04-28 NOTE — Assessment & Plan Note (Signed)
The pt is not currently on anti-hypertensive medications though she does have a history of hypertension. Her BP today is controlled at 128/71. She would like to continue to avoid unnecessary medications, not necessary to resume anti-HTN medications today. Recent BMP at neurology office visit unremarkable. Will continue to monitor at follow up.

## 2017-04-28 NOTE — Assessment & Plan Note (Signed)
Pt declined flu shot today. Order placed for screening mammogram. Due for colonoscopy next year.

## 2017-04-28 NOTE — Patient Instructions (Signed)
Nice to meet you today Julie Elliott.   I'm glad to hear you're doing well. Your blood pressure and recent labs look great. And your neurologist and other doctor had ordered good tests to see what may be going on with your hip. We will keep a close eye on the results as well. You should be getting a letter or call to schedule a mammogram soon. Let us know if there are any changes or if you have any questions before your next visit.

## 2017-04-29 ENCOUNTER — Ambulatory Visit (INDEPENDENT_AMBULATORY_CARE_PROVIDER_SITE_OTHER): Payer: Medicare Other | Admitting: Neurology

## 2017-04-29 ENCOUNTER — Ambulatory Visit (INDEPENDENT_AMBULATORY_CARE_PROVIDER_SITE_OTHER): Payer: Self-pay | Admitting: Neurology

## 2017-04-29 DIAGNOSIS — R29898 Other symptoms and signs involving the musculoskeletal system: Secondary | ICD-10-CM

## 2017-04-29 DIAGNOSIS — R7309 Other abnormal glucose: Secondary | ICD-10-CM

## 2017-04-29 DIAGNOSIS — Z0289 Encounter for other administrative examinations: Secondary | ICD-10-CM

## 2017-04-29 DIAGNOSIS — R531 Weakness: Secondary | ICD-10-CM

## 2017-04-29 LAB — BASIC METABOLIC PANEL
BUN / CREAT RATIO: 16 (ref 9–23)
BUN: 10 mg/dL (ref 6–24)
CALCIUM: 9.1 mg/dL (ref 8.7–10.2)
CHLORIDE: 107 mmol/L — AB (ref 96–106)
CO2: 23 mmol/L (ref 20–29)
Creatinine, Ser: 0.61 mg/dL (ref 0.57–1.00)
GFR, EST AFRICAN AMERICAN: 115 mL/min/{1.73_m2} (ref >59)
GFR, EST NON AFRICAN AMERICAN: 100 mL/min/{1.73_m2} (ref >59)
Glucose: 98 mg/dL (ref 65–99)
Potassium: 4.4 mmol/L (ref 3.5–5.2)
Sodium: 144 mmol/L (ref 134–144)

## 2017-04-29 LAB — CK: CK TOTAL: 151 U/L (ref 24–173)

## 2017-04-29 LAB — HEMOGLOBIN A1C

## 2017-04-30 ENCOUNTER — Other Ambulatory Visit: Payer: Self-pay | Admitting: Neurology

## 2017-04-30 DIAGNOSIS — G5603 Carpal tunnel syndrome, bilateral upper limbs: Secondary | ICD-10-CM

## 2017-04-30 NOTE — Procedures (Signed)
Full Name: Julie Elliott Gender: Female MRN #: [id number] Date of Birth: June 01, 2056    Visit Date: 04/29/17 09:42 Age: 61 Years 2 Months Old Examining Physician: Sarina Ill, MD  Referring Physician: Jaynee Eagles, MD  History: 61 year old female with progressive weakness most affected in the legs.  Summary: The right median motor nerve showed delayed distal onset latency (5.3 ms, N<4.4) and reduced amplitude (3.9 mV, N>4). The left median motor nerve showed delayed distal onset latency (5.8 ms, N<4.4). The right median sensory nerve showed delayed distal peak latency (4.4 ms, N<3.4). The left median sensory nerve showed delayed distal peak latency (4.8 ms, N<3.4) and reduced amplitude (5 V, N>10).  The left median/ulnar (palm) comparison nerve showed prolonged distal peak latency (Median Palm, 3.5 ms, N<2.2) and abnormal  peak latency difference (Median Palm-Ulnar Palm, 1.6 ms, N<0.4) with a relative median delay. The right median/ulnar (palm) comparison nerve showed prolonged distal peak latency (Median Palm, 3.5 ms, N<2.2) and abnormal peak latency difference (Median Palm-Ulnar Palm, 1.4 ms, N<0.4) with a relative median delay.  The left ulnar motor nerve showed delayed distal onset latency (4.1 ms, N<3.3). The right peroneal motor nerve showed delayed distal onset latency (7.2 ms, N<6.5) and reduced amplitude (1.4 mV, N>2). The left peroneal motor nerve showed delayed distal onset latency (7.6 ms, N<6.5) and reduced amplitude (1.3 mV, N>2).  The left tibial motor nerve showed delayed distal onset latency (8.1, N<5.8). The left sural sensory nerve showed delayed distal peak latency (4.5 ms, N<4.4). The right tibial and left tibial F-wave showed delayed latencies. The bilateral opponens pollicis muscles showed increased spontaneous activity (+3 positive sharp waves). All remaining muscles remaining nerves were within normal limits as detailed in the tables before.    Conclusion:  1. Bilateral  severe carpal tunnel syndrome. We'll refer to [place]. 2. Reduced amplitude and delayed latencies of lower extremity motor nerves with essentially normal sensory conductions may be due to L5-S1 radiculopathy however cannot verify given normal EMG exam. 3. No suggestion of myopathy, myositis or neuromuscular disorder. 3. Pending MRI of the lumbar spine for further evaluation.  Sarina Ill, M.D.  [facility] [place], [place] [address] Tel: [phone] Fax: [phone]        [facility]    Nerve / Sites Muscle Latency Ref. Amplitude Ref. Rel Amp Segments Distance Velocity Ref. Area    ms ms mV mV %  cm m/s m/s mVms  R Median - APB     Wrist APB 5.3 ?4.4 3.9 ?4.0 100 Wrist - APB 7   11.2     Upper arm APB 9.8  3.7  94.3 Upper arm - Wrist 20 44 ?49 11.1  L Median - APB     Wrist APB 5.8 ?4.4 6.7 ?4.0 100 Wrist - APB 7   22.7     Upper arm APB 11.6  5.4  81 Upper arm - Wrist 21 36 ?49 19.6  R Ulnar - ADM     Wrist ADM 3.2 ?3.3 9.7 ?6.0 100 Wrist - ADM 7   26.8     B.Elbow ADM 6.4  9.3  96.1 B.Elbow - Wrist 16 50 ?49 25.9     A.Elbow ADM 8.8  9.3  100 A.Elbow - B.Elbow 12 51 ?49 25.6         A.Elbow - Wrist      L Ulnar - ADM     Wrist ADM 4.1 ?3.3 9.5 ?6.0 100 Wrist -  ADM 7   19.9     B.Elbow ADM 7.4  9.8  103 B.Elbow - Wrist 16 48 ?49 19.5     A.Elbow ADM 10.0  9.5  96.7 A.Elbow - B.Elbow 12 46 ?49 19.8         A.Elbow - Wrist      R Peroneal - EDB     Ankle EDB 7.2 ?6.5 1.4 ?2.0 100 Ankle - EDB 9   4.1     Fib head EDB 14.2  1.3  91.2 Fib head - Ankle 27 39 ?44 4.2     Pop fossa EDB 16.5  1.3  102 Pop fossa - Fib head 9 39 ?44 4.8         Pop fossa - Ankle      L Peroneal - EDB     Ankle EDB 7.6 ?6.5 1.3 ?2.0 100 Ankle - EDB 9   4.4     Fib head EDB 15.5  1.1  80.3 Fib head - Ankle 27 34 ?44 4.0     Pop fossa EDB 18.1  0.8  74.1 Pop fossa - Fib head 10 38 ?44 2.9         Pop fossa - Ankle      R Tibial - AH     Ankle AH 4.6 ?5.8 7.0 ?4.0 100 Ankle  - AH 9   24.1     Pop fossa AH 13.2  6.1  86.5 Pop fossa - Ankle 33 38 ?41 21.5  L Tibial - AH     Ankle AH 8.1 ?5.8 10.5 ?4.0 100 Ankle - AH 9   24.9     Pop fossa AH 17.3  9.4  89.7 Pop fossa - Ankle 33 36 ?41 29.7                     SNC    Nerve / Sites Rec. Site Peak Lat Ref.  Amp Ref. Segments Distance Peak Diff Ref.    ms ms V V  cm ms ms  R Radial - Anatomical snuff box (Forearm)     Forearm Wrist 2.7 ?2.9 26 ?15 Forearm - Wrist 10    L Radial - Anatomical snuff box (Forearm)     Forearm Wrist 2.8 ?2.9 18 ?15 Forearm - Wrist 10    R Sural - Ankle (Calf)     Calf Ankle 4.4 ?4.4 8 ?6 Calf - Ankle 14    L Sural - Ankle (Calf)     Calf Ankle 4.5 ?4.4 9 ?6 Calf - Ankle 14    R Superficial peroneal - Ankle     Lat leg Ankle 3.6 ?4.4 7 ?6 Lat leg - Ankle 14    L Superficial peroneal - Ankle     Lat leg Ankle 4.2 ?4.4 8 ?6 Lat leg - Ankle 14    R Median, Ulnar - Transcarpal comparison     Median Palm Wrist 3.5 ?2.2 35 ?35 Median Palm - Wrist 8       Ulnar Palm Wrist 2.1 ?2.2 44 ?12 Ulnar Palm - Wrist 8          Median Palm - Ulnar Palm  1.4 ?0.4  L Median, Ulnar - Transcarpal comparison     Median Palm Wrist 3.5 ?2.2 20 ?35 Median Palm - Wrist 8       Ulnar Palm Wrist 1.9 ?2.2 36 ?12 Ulnar Palm - Wrist 8  Median Palm - Ulnar Palm  1.6 ?0.4  R Median - Orthodromic (Dig II, Mid palm)     Dig II Wrist 4.4 ?3.4 15 ?10 Dig II - Wrist 13    L Median - Orthodromic (Dig II, Mid palm)     Dig II Wrist 4.8 ?3.4 5 ?10 Dig II - Wrist 13    R Ulnar - Orthodromic, (Dig V, Mid palm)     Dig V Wrist 2.8 ?3.1 20 ?5 Dig V - Wrist 11    L Ulnar - Orthodromic, (Dig V, Mid palm)     Dig V Wrist 2.9 ?3.1 17 ?5 Dig V - Wrist 17                               F  Wave    Nerve F Lat Ref.   ms ms  R Tibial - AH 54.7 ?56.0  R Ulnar - ADM 28.7 ?32.0  L Ulnar - ADM 30.3 ?32.0  L Tibial - AH 59.5 ?56.0             H Reflex    Nerve H Lat Lat Hmax   ms ms   Left Right Ref. Left Right  Ref.  Tibial - Soleus 32.7 35 ?35.0 41.6  ?35.0         EMG full       EMG Summary Table    Spontaneous MUAP Recruitment  Muscle IA Fib PSW Fasc Other Amp Dur. Poly Pattern  L. Deltoid Normal None None None _______ Normal Normal Normal Normal  R. Deltoid Normal None None None _______ Normal Normal Normal Normal  L. Triceps brachii Normal None None None _______ Normal Normal Normal Normal  R. Triceps brachii Normal None None None _______ Normal Normal Normal Normal  L. Pronator teres Normal None None None _______ Normal Normal Normal Normal  R. Pronator teres Normal None None None _______ Normal Normal Normal Normal  L. First dorsal interosseous Normal None None None _______ Normal Normal Normal Normal  R. First dorsal interosseous Normal None None None _______ Normal Normal Normal Reduced  L. Opponens pollicis Normal None +3 None _______ Normal Normal Normal Normal  R. Opponens pollicis Normal None +3 None _______ Normal Normal Normal Normal  L. Iliopsoas Normal None None None _______ Normal Normal Normal Normal  R. Iliopsoas Normal None None None _______ Normal Normal Normal Normal  L. Vastus medialis Normal None None None _______ Normal Normal Normal Normal  R. Vastus medialis Normal None None None _______ Normal Normal Normal Normal  L. Tibialis anterior Normal None None None _______ Normal Normal Normal Normal  R. Tibialis anterior Normal None None None _______ Normal Normal Normal Normal  L. Gastrocnemius (Medial head) Normal None None None _______ Normal Normal Normal Normal  R. Gastrocnemius (Medial head) Normal None None None _______ Normal Normal Normal Normal  L. Biceps femoris (long head) Normal None None None _______ Normal Normal Normal Normal  R. Biceps femoris (long head) Normal None None None _______ Normal Normal Normal Normal  L. Gluteus maximus Normal None None None _______ Normal Normal Normal Normal  R. Gluteus maximus Normal None None None _______ Normal Normal  Normal Normal  L. Gluteus medius Normal None None None _______ Normal Normal Normal Normal  R. Gluteus medius Normal None None None _______ Normal Normal Normal Normal  L. Lumbar paraspinals (low) Normal None None None _______ Normal Normal Normal Normal  R.  Lumbar paraspinals (low) Normal None None None _______ Normal Normal Normal Normal

## 2017-04-30 NOTE — Progress Notes (Signed)
See procedure note.

## 2017-04-30 NOTE — Progress Notes (Signed)
Full Name: Julie Elliott Gender: Female MRN #: [id number] Date of Birth: 2055/08/09    Visit Date: 04/29/17 09:42 Age: 61 Years 2 Months Old Examining Physician: Sarina Ill, MD  Referring Physician: Jaynee Eagles, MD  History: 61 year old female with progressive weakness most affected in the legs.  Summary: The right median motor nerve showed delayed distal onset latency (5.3 ms, N<4.4) and reduced amplitude (3.9 mV, N>4). The left median motor nerve showed delayed distal onset latency (5.8 ms, N<4.4). The right median sensory nerve showed delayed distal peak latency (4.4 ms, N<3.4). The left median sensory nerve showed delayed distal peak latency (4.8 ms, N<3.4) and reduced amplitude (5 V, N>10).  The left median/ulnar (palm) comparison nerve showed prolonged distal peak latency (Median Palm, 3.5 ms, N<2.2) and abnormal  peak latency difference (Median Palm-Ulnar Palm, 1.6 ms, N<0.4) with a relative median delay. The right median/ulnar (palm) comparison nerve showed prolonged distal peak latency (Median Palm, 3.5 ms, N<2.2) and abnormal peak latency difference (Median Palm-Ulnar Palm, 1.4 ms, N<0.4) with a relative median delay.  The left ulnar motor nerve showed delayed distal onset latency (4.1 ms, N<3.3). The right peroneal motor nerve showed delayed distal onset latency (7.2 ms, N<6.5) and reduced amplitude (1.4 mV, N>2). The left peroneal motor nerve showed delayed distal onset latency (7.6 ms, N<6.5) and reduced amplitude (1.3 mV, N>2).  The left tibial motor nerve showed delayed distal onset latency (8.1, N<5.8). The left sural sensory nerve showed delayed distal peak latency (4.5 ms, N<4.4). The right tibial and left tibial F-wave showed delayed latencies. The bilateral opponens pollicis muscles showed increased spontaneous activity (+3 positive sharp waves). All remaining muscles remaining nerves were within normal limits as detailed in the tables before.    Conclusion:  1. Bilateral  severe carpal tunnel syndrome. We'll refer to [place]. 2. Reduced amplitude and delayed latencies of lower extremity motor nerves with essentially normal sensory conductions may be due to L5-S1 radiculopathy however cannot verify given normal EMG exam. 3. No suggestion of myopathy, myositis or neuromuscular disorder. 3. Pending MRI of the lumbar spine for further evaluation.  Sarina Ill, M.D.  [facility] [place],  [address] Tel: [phone] Fax: [phone]        [facility]    Nerve / Sites Muscle Latency Ref. Amplitude Ref. Rel Amp Segments Distance Velocity Ref. Area    ms ms mV mV %  cm m/s m/s mVms  R Median - APB     Wrist APB 5.3 ?4.4 3.9 ?4.0 100 Wrist - APB 7   11.2     Upper arm APB 9.8  3.7  94.3 Upper arm - Wrist 20 44 ?49 11.1  L Median - APB     Wrist APB 5.8 ?4.4 6.7 ?4.0 100 Wrist - APB 7   22.7     Upper arm APB 11.6  5.4  81 Upper arm - Wrist 21 36 ?49 19.6  R Ulnar - ADM     Wrist ADM 3.2 ?3.3 9.7 ?6.0 100 Wrist - ADM 7   26.8     B.Elbow ADM 6.4  9.3  96.1 B.Elbow - Wrist 16 50 ?49 25.9     A.Elbow ADM 8.8  9.3  100 A.Elbow - B.Elbow 12 51 ?49 25.6         A.Elbow - Wrist      L Ulnar - ADM     Wrist ADM 4.1 ?3.3 9.5 ?6.0 100 Wrist -  ADM 7   19.9     B.Elbow ADM 7.4  9.8  103 B.Elbow - Wrist 16 48 ?49 19.5     A.Elbow ADM 10.0  9.5  96.7 A.Elbow - B.Elbow 12 46 ?49 19.8         A.Elbow - Wrist      R Peroneal - EDB     Ankle EDB 7.2 ?6.5 1.4 ?2.0 100 Ankle - EDB 9   4.1     Fib head EDB 14.2  1.3  91.2 Fib head - Ankle 27 39 ?44 4.2     Pop fossa EDB 16.5  1.3  102 Pop fossa - Fib head 9 39 ?44 4.8         Pop fossa - Ankle      L Peroneal - EDB     Ankle EDB 7.6 ?6.5 1.3 ?2.0 100 Ankle - EDB 9   4.4     Fib head EDB 15.5  1.1  80.3 Fib head - Ankle 27 34 ?44 4.0     Pop fossa EDB 18.1  0.8  74.1 Pop fossa - Fib head 10 38 ?44 2.9         Pop fossa - Ankle      R Tibial - AH     Ankle AH 4.6 ?5.8 7.0 ?4.0 100 Ankle  - AH 9   24.1     Pop fossa AH 13.2  6.1  86.5 Pop fossa - Ankle 33 38 ?41 21.5  L Tibial - AH     Ankle AH 8.1 ?5.8 10.5 ?4.0 100 Ankle - AH 9   24.9     Pop fossa AH 17.3  9.4  89.7 Pop fossa - Ankle 33 36 ?41 29.7                     SNC    Nerve / Sites Rec. Site Peak Lat Ref.  Amp Ref. Segments Distance Peak Diff Ref.    ms ms V V  cm ms ms  R Radial - Anatomical snuff box (Forearm)     Forearm Wrist 2.7 ?2.9 26 ?15 Forearm - Wrist 10    L Radial - Anatomical snuff box (Forearm)     Forearm Wrist 2.8 ?2.9 18 ?15 Forearm - Wrist 10    R Sural - Ankle (Calf)     Calf Ankle 4.4 ?4.4 8 ?6 Calf - Ankle 14    L Sural - Ankle (Calf)     Calf Ankle 4.5 ?4.4 9 ?6 Calf - Ankle 14    R Superficial peroneal - Ankle     Lat leg Ankle 3.6 ?4.4 7 ?6 Lat leg - Ankle 14    L Superficial peroneal - Ankle     Lat leg Ankle 4.2 ?4.4 8 ?6 Lat leg - Ankle 14    R Median, Ulnar - Transcarpal comparison     Median Palm Wrist 3.5 ?2.2 35 ?35 Median Palm - Wrist 8       Ulnar Palm Wrist 2.1 ?2.2 44 ?12 Ulnar Palm - Wrist 8          Median Palm - Ulnar Palm  1.4 ?0.4  L Median, Ulnar - Transcarpal comparison     Median Palm Wrist 3.5 ?2.2 20 ?35 Median Palm - Wrist 8       Ulnar Palm Wrist 1.9 ?2.2 36 ?12 Ulnar Palm - Wrist 8  Median Palm - Ulnar Palm  1.6 ?0.4  R Median - Orthodromic (Dig II, Mid palm)     Dig II Wrist 4.4 ?3.4 15 ?10 Dig II - Wrist 13    L Median - Orthodromic (Dig II, Mid palm)     Dig II Wrist 4.8 ?3.4 5 ?10 Dig II - Wrist 13    R Ulnar - Orthodromic, (Dig V, Mid palm)     Dig V Wrist 2.8 ?3.1 20 ?5 Dig V - Wrist 11    L Ulnar - Orthodromic, (Dig V, Mid palm)     Dig V Wrist 2.9 ?3.1 17 ?5 Dig V - Wrist 53                               F  Wave    Nerve F Lat Ref.   ms ms  R Tibial - AH 54.7 ?56.0  R Ulnar - ADM 28.7 ?32.0  L Ulnar - ADM 30.3 ?32.0  L Tibial - AH 59.5 ?56.0             H Reflex    Nerve H Lat Lat Hmax   ms ms   Left Right Ref. Left Right  Ref.  Tibial - Soleus 32.7 35 ?35.0 41.6  ?35.0         EMG full       EMG Summary Table    Spontaneous MUAP Recruitment  Muscle IA Fib PSW Fasc Other Amp Dur. Poly Pattern  L. Deltoid Normal None None None _______ Normal Normal Normal Normal  R. Deltoid Normal None None None _______ Normal Normal Normal Normal  L. Triceps brachii Normal None None None _______ Normal Normal Normal Normal  R. Triceps brachii Normal None None None _______ Normal Normal Normal Normal  L. Pronator teres Normal None None None _______ Normal Normal Normal Normal  R. Pronator teres Normal None None None _______ Normal Normal Normal Normal  L. First dorsal interosseous Normal None None None _______ Normal Normal Normal Normal  R. First dorsal interosseous Normal None None None _______ Normal Normal Normal Reduced  L. Opponens pollicis Normal None +3 None _______ Normal Normal Normal Normal  R. Opponens pollicis Normal None +3 None _______ Normal Normal Normal Normal  L. Iliopsoas Normal None None None _______ Normal Normal Normal Normal  R. Iliopsoas Normal None None None _______ Normal Normal Normal Normal  L. Vastus medialis Normal None None None _______ Normal Normal Normal Normal  R. Vastus medialis Normal None None None _______ Normal Normal Normal Normal  L. Tibialis anterior Normal None None None _______ Normal Normal Normal Normal  R. Tibialis anterior Normal None None None _______ Normal Normal Normal Normal  L. Gastrocnemius (Medial head) Normal None None None _______ Normal Normal Normal Normal  R. Gastrocnemius (Medial head) Normal None None None _______ Normal Normal Normal Normal  L. Biceps femoris (long head) Normal None None None _______ Normal Normal Normal Normal  R. Biceps femoris (long head) Normal None None None _______ Normal Normal Normal Normal  L. Gluteus maximus Normal None None None _______ Normal Normal Normal Normal  R. Gluteus maximus Normal None None None _______ Normal Normal  Normal Normal  L. Gluteus medius Normal None None None _______ Normal Normal Normal Normal  R. Gluteus medius Normal None None None _______ Normal Normal Normal Normal  L. Lumbar paraspinals (low) Normal None None None _______ Normal Normal Normal Normal  R.  Lumbar paraspinals (low) Normal None None None _______ Normal Normal Normal Normal

## 2017-04-30 NOTE — Progress Notes (Signed)
Internal Medicine Clinic Attending  I saw and evaluated the patient.  I personally confirmed the key portions of the history and exam documented by Dr. Harden and I reviewed pertinent patient test results.  The assessment, diagnosis, and plan were formulated together and I agree with the documentation in the resident's note.  

## 2017-05-05 ENCOUNTER — Ambulatory Visit
Admission: RE | Admit: 2017-05-05 | Discharge: 2017-05-05 | Disposition: A | Discharge status: Home / Self Care | Payer: Medicare Other | Source: Ambulatory Visit | Attending: Neurology | Admitting: Neurology

## 2017-05-05 DIAGNOSIS — R7309 Other abnormal glucose: Secondary | ICD-10-CM

## 2017-05-05 DIAGNOSIS — R339 Retention of urine, unspecified: Secondary | ICD-10-CM

## 2017-05-05 DIAGNOSIS — G834 Cauda equina syndrome: Secondary | ICD-10-CM

## 2017-05-05 DIAGNOSIS — R29898 Other symptoms and signs involving the musculoskeletal system: Secondary | ICD-10-CM

## 2017-05-05 DIAGNOSIS — G9519 Other vascular myelopathies: Secondary | ICD-10-CM

## 2017-05-05 DIAGNOSIS — M48062 Spinal stenosis, lumbar region with neurogenic claudication: Secondary | ICD-10-CM | POA: Diagnosis not present

## 2017-06-08 ENCOUNTER — Encounter: Payer: Self-pay | Admitting: Physical Medicine & Rehabilitation

## 2017-06-08 ENCOUNTER — Ambulatory Visit (HOSPITAL_BASED_OUTPATIENT_CLINIC_OR_DEPARTMENT_OTHER): Payer: Medicare Other | Admitting: Physical Medicine & Rehabilitation

## 2017-06-08 ENCOUNTER — Encounter: Payer: Medicare Other | Attending: Physical Medicine & Rehabilitation

## 2017-06-08 VITALS — BP 130/85 | HR 93 | Resp 14

## 2017-06-08 DIAGNOSIS — E119 Type 2 diabetes mellitus without complications: Secondary | ICD-10-CM | POA: Insufficient documentation

## 2017-06-08 DIAGNOSIS — E785 Hyperlipidemia, unspecified: Secondary | ICD-10-CM | POA: Insufficient documentation

## 2017-06-08 DIAGNOSIS — K219 Gastro-esophageal reflux disease without esophagitis: Secondary | ICD-10-CM | POA: Insufficient documentation

## 2017-06-08 DIAGNOSIS — I509 Heart failure, unspecified: Secondary | ICD-10-CM | POA: Insufficient documentation

## 2017-06-08 DIAGNOSIS — F17210 Nicotine dependence, cigarettes, uncomplicated: Secondary | ICD-10-CM | POA: Insufficient documentation

## 2017-06-08 DIAGNOSIS — I10 Essential (primary) hypertension: Secondary | ICD-10-CM | POA: Insufficient documentation

## 2017-06-08 DIAGNOSIS — G8250 Quadriplegia, unspecified: Secondary | ICD-10-CM

## 2017-06-08 DIAGNOSIS — Z9889 Other specified postprocedural states: Secondary | ICD-10-CM | POA: Diagnosis not present

## 2017-06-08 DIAGNOSIS — M1990 Unspecified osteoarthritis, unspecified site: Secondary | ICD-10-CM | POA: Diagnosis not present

## 2017-06-08 DIAGNOSIS — R51 Headache: Secondary | ICD-10-CM | POA: Diagnosis not present

## 2017-06-08 NOTE — Progress Notes (Signed)
Dysport Injection for spasticity using needle EMG guidance  Dilution: 200 Units/ml Indication: Severe spasticity which interferes with ADL,mobility and/or  hygiene and is unresponsive to medication management and other conservative care Informed consent was obtained after describing risks and benefits of the procedure with the patient. This includes bleeding, bruising, infection, excessive weakness, or medication side effects. A REMS form is on file and signed. Needle:  needle electrode Number of units per muscle Pectoralis 400 FDS 200 biceps, 200  FCR, 200 Pronator teres 100  Gastroc 400   All injections were done after obtaining appropriate EMG activity and after negative drawback for blood. The patient tolerated the procedure well. Post procedure instructions were given. A followup appointment was made.   Would recommend 200 Unit to long head of biceps 0 in short head  Would also recommend injecting the lateral head of the gastroc rather than medial head based on EMG activity.

## 2017-06-08 NOTE — Patient Instructions (Signed)
You received a Dysport injection today. You may experience muscle pains and aches. He may apply ice 20 minutes every 2 hours as needed for the next 24-48 hours. He also noticed bleeding or bruising in the areas that were injected. May apply Band-Aid. If this bruising is extensive, please notify our office. If there is evidence of increasing redness that occurs 2-3 days after injection. Please call our office. This could be a sign of infection. It is very rare, however. You may experience some muscle weakness in the muscles and injected. This would likely start in about one week.  

## 2017-06-22 DIAGNOSIS — M25552 Pain in left hip: Secondary | ICD-10-CM | POA: Diagnosis not present

## 2017-06-22 DIAGNOSIS — Z888 Allergy status to other drugs, medicaments and biological substances status: Secondary | ICD-10-CM | POA: Diagnosis not present

## 2017-06-22 DIAGNOSIS — R269 Unspecified abnormalities of gait and mobility: Secondary | ICD-10-CM | POA: Diagnosis not present

## 2017-06-22 DIAGNOSIS — Z7982 Long term (current) use of aspirin: Secondary | ICD-10-CM | POA: Diagnosis not present

## 2017-06-22 DIAGNOSIS — M47816 Spondylosis without myelopathy or radiculopathy, lumbar region: Secondary | ICD-10-CM | POA: Diagnosis not present

## 2017-06-22 DIAGNOSIS — I509 Heart failure, unspecified: Secondary | ICD-10-CM | POA: Diagnosis not present

## 2017-06-22 DIAGNOSIS — R202 Paresthesia of skin: Secondary | ICD-10-CM | POA: Diagnosis not present

## 2017-06-22 DIAGNOSIS — F17210 Nicotine dependence, cigarettes, uncomplicated: Secondary | ICD-10-CM | POA: Diagnosis not present

## 2017-06-22 DIAGNOSIS — R270 Ataxia, unspecified: Secondary | ICD-10-CM | POA: Diagnosis not present

## 2017-06-22 DIAGNOSIS — R29898 Other symptoms and signs involving the musculoskeletal system: Secondary | ICD-10-CM | POA: Diagnosis not present

## 2017-06-22 DIAGNOSIS — R200 Anesthesia of skin: Secondary | ICD-10-CM | POA: Diagnosis not present

## 2017-06-22 DIAGNOSIS — Z79899 Other long term (current) drug therapy: Secondary | ICD-10-CM | POA: Diagnosis not present

## 2017-07-08 DIAGNOSIS — M25552 Pain in left hip: Secondary | ICD-10-CM | POA: Diagnosis not present

## 2017-07-11 DIAGNOSIS — M542 Cervicalgia: Secondary | ICD-10-CM | POA: Diagnosis not present

## 2017-07-11 DIAGNOSIS — M79642 Pain in left hand: Secondary | ICD-10-CM | POA: Diagnosis not present

## 2017-07-11 DIAGNOSIS — M79641 Pain in right hand: Secondary | ICD-10-CM | POA: Diagnosis not present

## 2017-07-11 DIAGNOSIS — M24542 Contracture, left hand: Secondary | ICD-10-CM | POA: Diagnosis not present

## 2017-07-17 ENCOUNTER — Encounter: Payer: Self-pay | Admitting: Physical Medicine & Rehabilitation

## 2017-07-17 ENCOUNTER — Ambulatory Visit (HOSPITAL_BASED_OUTPATIENT_CLINIC_OR_DEPARTMENT_OTHER): Payer: Medicare Other | Admitting: Physical Medicine & Rehabilitation

## 2017-07-17 ENCOUNTER — Encounter: Payer: Medicare Other | Attending: Physical Medicine & Rehabilitation

## 2017-07-17 VITALS — BP 156/98 | HR 109

## 2017-07-17 DIAGNOSIS — R51 Headache: Secondary | ICD-10-CM | POA: Diagnosis not present

## 2017-07-17 DIAGNOSIS — F17210 Nicotine dependence, cigarettes, uncomplicated: Secondary | ICD-10-CM | POA: Diagnosis not present

## 2017-07-17 DIAGNOSIS — E785 Hyperlipidemia, unspecified: Secondary | ICD-10-CM | POA: Diagnosis not present

## 2017-07-17 DIAGNOSIS — Z9889 Other specified postprocedural states: Secondary | ICD-10-CM | POA: Diagnosis not present

## 2017-07-17 DIAGNOSIS — G8250 Quadriplegia, unspecified: Secondary | ICD-10-CM

## 2017-07-17 DIAGNOSIS — E119 Type 2 diabetes mellitus without complications: Secondary | ICD-10-CM | POA: Insufficient documentation

## 2017-07-17 DIAGNOSIS — G9520 Unspecified cord compression: Secondary | ICD-10-CM

## 2017-07-17 DIAGNOSIS — I509 Heart failure, unspecified: Secondary | ICD-10-CM | POA: Diagnosis not present

## 2017-07-17 DIAGNOSIS — M1990 Unspecified osteoarthritis, unspecified site: Secondary | ICD-10-CM | POA: Insufficient documentation

## 2017-07-17 DIAGNOSIS — I10 Essential (primary) hypertension: Secondary | ICD-10-CM | POA: Insufficient documentation

## 2017-07-17 DIAGNOSIS — K219 Gastro-esophageal reflux disease without esophagitis: Secondary | ICD-10-CM | POA: Insufficient documentation

## 2017-07-17 NOTE — Patient Instructions (Signed)
Order for outpt PT

## 2017-07-17 NOTE — Progress Notes (Signed)
Subjective:    Patient ID: Julie Elliott, female    DOB: September 20, 1955, 61 y.o.   MRN: [id number]  HPI  61 year old female with history of cervical myelopathy.  She has spastic quadriparesis.  She is undergone  Dysportinjection to the left upper extremity and left lower extremity approximately 6 weeks ago with the following dosages. Pectoralis 400 FDS 200 biceps, 200  FCR, 200 Pronator teres 100  Gastroc 400  Left hip flexor weakness, gradual onset, MRI lumbar spine showed no compressive lesions, left hip MRI showed mild osteoarthritis. Pain Inventory Average Pain 0 Pain Right Now 0 My pain is na  In the last 24 hours, has pain interfered with the following? General activity 0 Relation with others 0 Enjoyment of life 0 What TIME of day is your pain at its worst? na Sleep (in general) Good  Pain is worse with: na Pain improves with: na Relief from Meds: 5  Mobility walk with assistance use a cane ability to climb steps?  yes  Function disabled: date disabled . I need assistance with the following:  dressing, household duties and shopping  Neuro/Psych No problems in this area  Prior Studies Any changes since last visit?  no  Physicians involved in your care Any changes since last visit?  no   Family History  Problem Relation Age of Onset  . Diabetes Mother   . Heart disease Mother   . Prostate cancer Father   . Neuropathy Neg Hx    Social History   Socioeconomic History  . Marital status: Single    Spouse name: Not on file  . Number of children: 3  . Years of education: 47  . Highest education level: Not on file  Social Needs  . Financial resource strain: Not on file  . Food insecurity - worry: Not on file  . Food insecurity - inability: Not on file  . Transportation needs - medical: Not on file  . Transportation needs - non-medical: Not on file  Occupational History  . Occupation: Unemployed  Tobacco Use  . Smoking status: Current Some Day Smoker     Types: Cigarettes    Last attempt to quit: 04/18/2013    Years since quitting: 4.2  . Smokeless tobacco: Current User  . Tobacco comment: 1-2 cigs in the a.m.  Substance and Sexual Activity  . Alcohol use: No    Alcohol/week: 0.0 oz  . Drug use: No  . Sexual activity: Not on file  Other Topics Concern  . Not on file  Social History Narrative   to get supplies nfrom [facility] [employer] per pt request.1--[phone]   Lives at home with son.    Right handed.   Caffeine use: drinks coffee-rare   Past Surgical History:  Procedure Laterality Date  . carpel tunnel Right    20 yrs ago  . CERVICAL SPINE SURGERY  2016  . TUBAL LIGATION     Past Medical History:  Diagnosis Date  . CHF (congestive heart failure) ([place])   . Chronic headaches   . DJD (degenerative joint disease)   . DM (diabetes mellitus) ([place]) 2008   stable HgBA1C at 6.5  . GERD (gastroesophageal reflux disease)    well controlled on Omeprazole  . HLD (hyperlipidemia) 2008   stable, well controlled  . Hypertension    well controlled   There were no vitals taken for this visit.  Opioid Risk Score:   Fall Risk Score:  `1  Depression screen PHQ 2/9  Depression screen  [facility] 2/9 04/28/2017 11/11/2016 05/13/2016 11/06/2015 10/19/2015 08/14/2015 02/27/2015  Decreased Interest 0 0 0 0 1 0 0  Down, Depressed, Hopeless 0 0 0 0 0 0 0  PHQ - 2 Score 0 0 0 0 1 0 0  Altered sleeping - - - - 0 - -  Tired, decreased energy - - - - 0 - -  Change in appetite - - - - 0 - -  Feeling bad or failure about yourself  - - - - 0 - -  Trouble concentrating - - - - 0 - -  Moving slowly or fidgety/restless - - - - 0 - -  Suicidal thoughts - - - - 0 - -  PHQ-9 Score - - - - 1 - -  Difficult doing work/chores - - - - Not difficult at all - -  Some recent data might be hidden     Review of Systems  Constitutional: Negative.   HENT: Negative.   Eyes: Negative.   Respiratory: Negative.   Cardiovascular: Negative.   Gastrointestinal: Negative.     Endocrine: Negative.   Genitourinary: Negative.   Musculoskeletal: Negative.   Skin: Negative.   Allergic/Immunologic: Negative.   Neurological: Negative.   Hematological: Negative.   Psychiatric/Behavioral: Negative.   All other systems reviewed and are negative.      Objective:   Physical Exam  Tone-with grade 2 at the left pectoralis left bicep-with 1 at the wrist flexors finger flexors, [place] 2 at the lumbricals on the left Ashworth 2 at the biceps femoris.  3- strength in the left deltoid biceps triceps finger flexors extensors 3- at the left hip flexor 4 at the right hip flexor 4 at the right knee extensor 4 at the left knee extensor 3+ in the left ankle dorsiflexor 4+ right ankle dorsiflexor.      Assessment & Plan:  #1.  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  Overall she has had good improvement in functioning after decompression as well as extensive rehabilitation.  She has unfortunately not kept up with her home exercise program.  She has had some progressive lower extremity mild weakness mainly in the left side.  I do think she would benefit from additional therapy Terms of spasticity I think this for dosages are appropriate Would concentrate on long head of biceps brachii As well as lateral head of the gastroc

## 2017-08-01 ENCOUNTER — Inpatient Hospital Stay: Admit: 2017-08-01 | Payer: PRIVATE HEALTH INSURANCE | Primary: Registered Nurse

## 2017-08-01 ENCOUNTER — Encounter

## 2017-08-01 DIAGNOSIS — R05 Cough: Secondary | ICD-10-CM

## 2017-08-12 ENCOUNTER — Ambulatory Visit: Payer: Medicare Other | Attending: Physical Medicine & Rehabilitation | Admitting: Physical Therapy

## 2017-08-12 ENCOUNTER — Telehealth: Payer: Self-pay | Admitting: Physical Therapy

## 2017-08-12 ENCOUNTER — Encounter: Payer: Self-pay | Admitting: Physical Therapy

## 2017-08-12 DIAGNOSIS — M6281 Muscle weakness (generalized): Secondary | ICD-10-CM

## 2017-08-12 DIAGNOSIS — M25612 Stiffness of left shoulder, not elsewhere classified: Secondary | ICD-10-CM | POA: Diagnosis not present

## 2017-08-12 DIAGNOSIS — M6249 Contracture of muscle, multiple sites: Secondary | ICD-10-CM | POA: Insufficient documentation

## 2017-08-12 DIAGNOSIS — G8250 Quadriplegia, unspecified: Secondary | ICD-10-CM

## 2017-08-12 DIAGNOSIS — R2681 Unsteadiness on feet: Secondary | ICD-10-CM | POA: Diagnosis not present

## 2017-08-12 DIAGNOSIS — R2689 Other abnormalities of gait and mobility: Secondary | ICD-10-CM | POA: Insufficient documentation

## 2017-08-12 NOTE — Therapy (Signed)
[facility] [address] [place], [place], [address] Phone: [phone]   Fax:  [phone]  Physical Therapy Evaluation  Patient Details  Name: Julie Elliott MRN: [id number] Date of Birth: 1956/03/30 Referring Provider: Dr. Alysia Penna   Encounter Date: 08/12/2017  PT End of Session - 08/12/17 1537    Visit Number  1    Number of Visits  16    Date for PT Re-Evaluation  10/07/17    Authorization Type  Medicare/Medicaid    PT Start Time  1445    PT Stop Time  1527    PT Time Calculation (min)  42 min    Equipment Utilized During Treatment  Gait belt    Activity Tolerance  Patient tolerated treatment well    Behavior During Therapy  [facility] for tasks assessed/performed       Past Medical History:  Diagnosis Date  . CHF (congestive heart failure) ([place])   . Chronic headaches   . DJD (degenerative joint disease)   . DM (diabetes mellitus) ([place]) 2008   stable HgBA1C at 6.5  . GERD (gastroesophageal reflux disease)    well controlled on Omeprazole  . HLD (hyperlipidemia) 2008   stable, well controlled  . Hypertension    well controlled    Past Surgical History:  Procedure Laterality Date  . carpel tunnel Right    20 yrs ago  . CERVICAL SPINE SURGERY  2016  . TUBAL LIGATION      There were no vitals filed for this visit.   Subjective Assessment - 08/12/17 1447    Subjective  Pt is a 62 y/o female who presents to OPPT for cervical myelopathy s/p C4-C7 ACDF on 05/22/15.  Pt had PT/OT at this facility in May-July 2017.  Pt reports since d/c from this clinic both legs have become weaker and heavier.  Son is present and reports stairs are now a challenge for her.  Pt reports she is still doing exercises provided from this clinic daily.      Patient is accompained by:  Family member    Limitations  Walking    Patient Stated Goals  improve balance, negotiate stairs safely    Currently in Pain?  No/denies          [facility] PT Assessment - 08/12/17 1455      Assessment   Medical Diagnosis  Spastic quadriparesis    Referring Provider  Dr. Alysia Penna    Onset Date/Surgical Date  05/22/15    Hand Dominance  Right    Next MD Visit  09/11/17    Prior Therapy  2 months at this clinic in 2017      Precautions   Precautions  Fall      Restrictions   Weight Bearing Restrictions  No      Balance Screen   Has the patient fallen in the past 6 months  No    How many times?  fell in April 2018    Has the patient had a decrease in activity level because of a fear of falling?   No    Is the patient reluctant to leave their home because of a fear of falling?   No      Home Environment   Living Environment  Private residence    Living Arrangements  Children    Available Help at Discharge  Personal care attendant;Family;Available PRN/intermittently has aide in the mornings to help with bathing    Type of  Home  House    Home Access  Stairs to enter    Entrance Stairs-Number of Steps  4    Entrance Stairs-Rails  Right with wall on Lt    Home Layout  One level    [place] - quad;Bedside commode;Shower seat lift chair      Prior Function   Level of Independence  Needs assistance with ADLs;Requires assistive device for independence    Level of Independence - [place]  Moderate    Dressing  Moderate    Vocation  On disability    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; watch TV/movies      Cognition   Overall Cognitive Status  Within Functional Limits for tasks assessed      Tone   Assessment Location  Right Lower Extremity;Left Lower Extremity      ROM / Strength   AROM / PROM / Strength  Strength      Strength   Strength Assessment Site  Hip;Knee;Ankle    Right/Left Hip  Right;Left    Right Hip Flexion  3-/5    Left Hip Flexion  3-/5    Right/Left Knee  Right;Left    Right Knee Flexion  2/5    Right Knee Extension  3/5     Left Knee Flexion  2/5    Left Knee Extension  3/5    Right/Left Ankle  Right;Left    Right Ankle Dorsiflexion  3/5    Left Ankle Dorsiflexion  3-/5      Palpation   Palpation comment  tightness Lt>Rt gastroc/soleus with Lt plantarflexion contracture at ~ 5 deg plantarflexion      Transfers   Transfers  Sit to Stand;Stand to Sit    Sit to Stand  5: Supervision;With upper extremity assist;From chair/3-in-1    Sit to Stand Details (indicate cue type and reason)  wide BOS needed to stand with increased forward flexion at trunk    Stand to Sit  5: Supervision      Ambulation/Gait   Ambulation/Gait  Yes    Ambulation/Gait Assistance  4: Min guard    Ambulation Distance (Feet)  150 Feet    Assistive device  Small based quad cane    Gait Pattern  Decreased step length - right;Decreased stance time - left;Decreased hip/knee flexion - left;Decreased hip/knee flexion - right;Decreased dorsiflexion - right;Decreased dorsiflexion - left;Abducted - left;Poor foot clearance - left    Ambulation Surface  Level;Indoor    Gait velocity  1.17 ft/sec 27.97 sec       Standardized Balance Assessment   Standardized Balance Assessment  Timed Up and Go Test      Timed Up and Go Test   Normal TUG (seconds)  32.41    TUG Comments  with SBQC      RLE Tone   RLE Tone  Mild      LLE Tone   LLE Tone  Moderate             Objective measurements completed on examination: See above findings.              PT Education - 08/12/17 1535    Education provided  Yes    Education Details  continue current HEP, pt to perform sit to/from stand transfers without assist from family    Person(s) Educated  Patient;Child(ren)    Methods  Explanation    Comprehension  Verbalized understanding  PT Short Term Goals - 08/12/17 1542      PT SHORT TERM GOAL #1   Title  independent with initial HEP    Status  New    Target Date  09/09/17      PT SHORT TERM GOAL #2   Title  improve gait  velocity to > 1.4 ft/sec for improved function    Status  New    Target Date  09/09/17      PT SHORT TERM GOAL #3   Title  perform BERG with STG and LTG to be written    Status  New    Target Date  08/19/17      PT SHORT TERM GOAL #4   Title  amb > 250' with LRAD with supervision for improved mobility and function    Status  New    Target Date  09/09/17      PT SHORT TERM GOAL #5   Title  Pt will consistently perform sit <> stand from standard chair with mod I, normal BOS, and symmetrical LE placement.    Status  New    Target Date  09/09/17        PT Long Term Goals - 08/12/17 1544      PT LONG TERM GOAL #1   Title  Pt will independently demo HEP to maximize functional gains made in PT.    Status  New    Target Date  10/07/17      PT LONG TERM GOAL #2   Title  improve gait velocity to > 1.8 ft/sec for decreased fall risk    Status  New    Target Date  10/07/17      PT LONG TERM GOAL #3   Title  improve timed up and go to < 20 sec with LRAD for improved function and mobility    Status  New    Target Date  10/07/17      PT LONG TERM GOAL #4   Title  amb > 400' on indoor/paved outdoor surfaces with LRAD mod I for improved community access    Status  New    Target Date  10/07/17      PT LONG TERM GOAL #5   Title  negotiate stairs with supervision for improved functional mobility    Status  New    Target Date  10/07/17             Plan - 08/12/17 1537    Clinical Impression Statement  Pt is a 62 y/o female who presents to OPPT for spastic quadraplegia due to cervical myelopathy resulting in C4-7 ACDF.  Pt is over 2 years from initial surgery, but demonstrates functional decline since d/c from PT at this clinic in July 2017.  Pt will benefit from PT to address the deficits listed.    History and Personal Factors relevant to plan of care:  > 3 comorbidities, functional decline since d/c from PT in 2017    Clinical Presentation  Evolving    Clinical Presentation  due to:  functional decline, fall risk    Clinical Decision Making  Moderate    Rehab Potential  Fair    PT Frequency  2x / week    PT Duration  8 weeks    PT Treatment/Interventions  ADLs/Self Care Home Management;Cryotherapy;Electrical Stimulation;Moist Heat;Therapeutic exercise;Therapeutic activities;Functional mobility training;Stair training;Patient/family education;Gait training;DME Instruction;Ultrasound;Balance training;Neuromuscular re-education;Orthotic Fit/Training;Manual techniques;Taping    PT Next Visit Plan  needs BERG with goals to be  written; try gait with SPC/quad tip; look at HEP provided by Benjie Karvonen in 2017 and update PRN    Consulted and Agree with Plan of Care  Patient       Patient will benefit from skilled therapeutic intervention in order to improve the following deficits and impairments:  Abnormal gait, Pain, Increased muscle spasms, Increased fascial restricitons, Decreased strength, Difficulty walking, Decreased balance, Impaired flexibility, Decreased range of motion, Hypomobility, Decreased knowledge of use of DME, Decreased mobility  Visit Diagnosis: Other abnormalities of gait and mobility - Plan: PT plan of care cert/re-cert  Unsteadiness on feet - Plan: PT plan of care cert/re-cert  Muscle weakness (generalized) - Plan: PT plan of care cert/re-cert  Contracture of muscle, multiple sites - Plan: PT plan of care cert/re-cert     Problem List Patient Active Problem List   Diagnosis Date Noted  . Breast cancer screening by mammogram 04/28/2017  . Spastic quadriparesis ([place]) 11/02/2015  . Tobacco abuse 08/16/2015  . Peripheral neuropathy 07/17/2015  . Cervical cord compression with myelopathy ([place]) 02/08/2015  . Frozen shoulder--left 09/05/2014  . Unintentional weight loss 06/20/2014  . Health care maintenance 03/23/2012  . Essential hypertension 08/17/2006  . GERD 08/17/2006  . History of diabetes mellitus, type II 08/11/2006  . Hyperlipidemia  associated with type 2 diabetes mellitus ([place]) 08/11/2006  . Osteoarthritis 08/11/2006      Laureen Abrahams, PT, DPT 08/12/17 3:48 PM    [facility] [address] [place], [place], [address] Phone: [phone]   Fax:  [phone]  Name: Julie Elliott MRN: [id number] Date of Birth: Jan 25, 1956

## 2017-08-12 NOTE — Telephone Encounter (Signed)
Dr. Letta Pate, Julie Elliott was evaluated by PT on 08/12/17.  The patient would benefit from OT evaluation for UE weakness and spasticity.   If you agree, please place an order in [facility]que in [facility] or fax the order to [phone]. Thank you, Laureen Abrahams, PT, DPT 08/12/17 3:50 PM

## 2017-08-17 ENCOUNTER — Ambulatory Visit: Payer: Medicare Other | Admitting: Physical Therapy

## 2017-08-17 ENCOUNTER — Encounter: Payer: Self-pay | Admitting: Physical Therapy

## 2017-08-17 ENCOUNTER — Encounter: Payer: Self-pay | Admitting: Occupational Therapy

## 2017-08-17 ENCOUNTER — Ambulatory Visit: Payer: Medicare Other | Admitting: Occupational Therapy

## 2017-08-17 DIAGNOSIS — M6281 Muscle weakness (generalized): Secondary | ICD-10-CM

## 2017-08-17 DIAGNOSIS — R2689 Other abnormalities of gait and mobility: Secondary | ICD-10-CM

## 2017-08-17 DIAGNOSIS — R2681 Unsteadiness on feet: Secondary | ICD-10-CM | POA: Diagnosis not present

## 2017-08-17 DIAGNOSIS — M6249 Contracture of muscle, multiple sites: Secondary | ICD-10-CM | POA: Diagnosis not present

## 2017-08-17 DIAGNOSIS — M25612 Stiffness of left shoulder, not elsewhere classified: Secondary | ICD-10-CM | POA: Diagnosis not present

## 2017-08-17 NOTE — Patient Instructions (Addendum)
Hamstring Stretch, Seated (Strap, Two Chairs)    Sit with one leg extended onto facing chair.  1. Try to get knee as straight as possible (toes and knee straight - not pointing inward/outward); then hinged forward at waist to feel a stretch in the back of your leg. Hold for _30-60__ seconds, __2-3__ times per day on each leg.  2. Loop strap/belt over outstretched foot (as pictured). Keeping foot aligned straight, pull belt to feel gentle stretch in back of lower leg. Hold for 60 seconds, 2-3 times per day on each leg.  Hip Flexor Stretch    Lying on back near edge of (low) couch, bend one leg, foot flat. Hang leg over edge with knee bent and thigh resting on bed. Hold for 30-60 seconds, 2-3 times per day on each leg.    Functional Quadriceps: Sit to Stand    Sit on edge of chair, feet flat on floor. Scoot to edge of chair/bed. Be sure something stable is in front of you (in case you lose your balance forward). Try to stand up without using your hands. Repeat __10__ times per set. Do __2__ sets per day.  Feet Together (Compliant Surface) Head Motion - Eyes Closed    Stand with your back to a corner with a stable chair in front of you. Stand on 1 pillow with feet together. Close eyes and move head slowly: up and down 10 times; right to left 10 times. Repeat _2___ times per day. Single Leg Stance    Stand with your back to a corner with a stable chair in front of you. Raise one leg. Hold _10__ seconds. Repeat with other leg. Try this 8-10 times per day on each leg.

## 2017-08-17 NOTE — Therapy (Signed)
[facility] [address] [place], [place], [address] Phone: [phone]   Fax:  [phone]  Occupational Therapy Evaluation  Patient Details  Name: Julie Elliott MRN: [id number] Date of Birth: 06/11/1956 Referring Provider: Dr. Alysia Penna   Encounter Date: 08/17/2017  OT End of Session - 08/17/17 1441    Visit Number  1    Number of Visits  1    Date for OT Re-Evaluation  -- n/a    Authorization Type  Medicare    OT Start Time  6962    OT Stop Time  1425    OT Time Calculation (min)  20 min    Activity Tolerance  Patient tolerated treatment well       Past Medical History:  Diagnosis Date  . CHF (congestive heart failure) ([facility])   . Chronic headaches   . DJD (degenerative joint disease)   . DM (diabetes mellitus) ([place]) 2008   stable HgBA1C at 6.5  . GERD (gastroesophageal reflux disease)    well controlled on Omeprazole  . HLD (hyperlipidemia) 2008   stable, well controlled  . Hypertension    well controlled    Past Surgical History:  Procedure Laterality Date  . carpel tunnel Right    20 yrs ago  . CERVICAL SPINE SURGERY  2016  . TUBAL LIGATION      There were no vitals filed for this visit.  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  Pt had outpt PT and OT at this facility May- July 2017.  Returns stating legs feel weaker and heavier and stairs are hard to do.    Patient Stated Goals  I still have pain and stiffness in this L hand and it doesn't move so welll    Currently in Pain?  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 Alysia Penna    Onset Date/Surgical Date  05/22/15    Hand Dominance  Right    Next MD Visit  09/12/2017    Prior Therapy  2 months at this  clinic in 2017. Pt returned with weaker legs and PT asked for OT order to ensure pt had no other decline.      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 Pt has aide at home who assists her daily with ADL's and has since surgery.  Pt is able to do more of her own bathing and dressing however reports that "we have a system - I stand and she does the rest of it."  Pt states that she is happy with this arrangement.  Pt is able to manipulate clothing and do hygiene for toileting      IADL   Shopping  Assistance for transportation chooses not to shop      Mobility   Mobility Status  Needs assist    Mobility Status Comments  in community.  Pt states it is getting harder to do stairs and legs feel heavy      Written Expression   Dominant Hand  Right      Vision - History   Baseline Vision  Wears glasses all the time    Additional Comments  Pt denies any visual changes      Cognition   Overall Cognitive Status  Within Functional  Limits for tasks assessed      Coordination   Gross Motor Movements are Fluid and Coordinated  No in LUE, limited movement    Other  Pt does not have enough active movement in L hand to complete 9 hole peg.       Tone   Assessment Location  Left Upper Extremity      ROM / Strength   AROM / PROM / Strength  Strength;AROM;PROM      AROM   Overall AROM   Deficits    Overall AROM Comments  LUE - pt with active shoulder flexion to approximately 45* with compensations, abduction to approximately 45* with substitutions. Pt has contracture in L hand however is able to use a stabilizer.        PROM   Overall PROM   Deficits    Overall PROM Comments  Pt with limitations however improved PROM since d/c from OT in May 2017 (shoulder flexion improved from 50* to 65*, abduction improved from 65* to 70*).  Pt reports that she is doing 2/3 of her home exercies. Pt also reports that the MD told her she no longer needed to wear her splint for her L hand.  Pt reports that spasms in LUE have almost completely disappeared.        Strength   Overall Strength  Deficits;Unable to assess    Overall Strength Comments  RUEL WFL's,  Unable to assess LUE due to contracture and altered tone      Hand Function   Right Hand Gross Grasp  Functional    Left Hand Gross Grasp  Impaired    Comment  Pt can use L hand  as stabilizer and is using L hand more at home as stabilizer than she was when she was discharged from OT in July 2017.        LUE Tone   LUE Tone  Moderate pt also with contracture in LUE          Tx:  Reviewed HEP for LUE - pt able to return demonstrate.  OT Education - 08/17/17 1436    Education provided  Yes    Education Details  reviewed HEP issued from last theray session    Person(s) Educated  Patient;Child(ren)    Methods  Explanation;Demonstration    Comprehension  Verbalized understanding;Returned demonstration          OT Long Term Goals - 08/17/17 1437       OT LONG TERM GOAL #1   Title  n/a            Plan - 08/17/17 1437    Clinical Impression Statement  Pt is a 62 year old female s/p C4-C7 ACDF on 05/22/2015 with resultant spastic tetraplegia. Pt is well known to this therapist as she received therapies in this clinic from May-July 2017.  Curent evaluation demonstrates that pt has had no decline from an OT standpoint and has actually improved in terms of PROM of LUE and use of LUE as stabilizer.  Pt does not wish to change how she currently does ADL's (has an aide who assists her) .   Pt completes 2/3 UE exercises given to her prior to today. Given lack of functional decline and some improvement in LUE since last session, do not feel pt requires skilled OT at this time. Pt and son in agreement.     Plan  no further OT indicated.     Clinical Decision Making  Limited treatment options, no task modification necessary    Consulted and Agree with Plan of Care  Patient;Family member/caregiver    Family Member Consulted  son       Patient will benefit from skilled therapeutic intervention in order to improve the following deficits and impairments:     Visit Diagnosis: Stiffness of left shoulder joint    Problem List Patient Active Problem List   Diagnosis Date Noted  . Breast cancer screening by mammogram 04/28/2017  . 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 Tobacco abuse 08/16/2015  . Peripheral neuropathy 07/17/2015  . Cervical cord compression with myelopathy ([place]) 02/08/2015  . Frozen shoulder--left 09/05/2014  . Unintentional weight loss 06/20/2014  . Health care maintenance 03/23/2012  . Essential hypertension 08/17/2006  . GERD 08/17/2006  . History of diabetes mellitus, type II 08/11/2006  . Hyperlipidemia associated with type 2 diabetes mellitus ([place]) 08/11/2006  . Osteoarthritis 08/11/2006    [name], OTR/L 08/17/2017, 2:43 PM  [facility] [address] [place], [place], [address] Phone: [phone]   Fax:  [phone]  Name: Julie Elliott MRN: [id number] Date of Birth: 09/07/1955

## 2017-08-17 NOTE — Patient Instructions (Signed)
Reviewed HEP with pt - pt completing 2/3 exercises and states "I like to do some of these other ones too and my arm is better."

## 2017-08-18 NOTE — Therapy (Signed)
[facility] [address] [place], [place], [address] Phone: [phone]   Fax:  [phone]  Physical Therapy Treatment  Patient Details  Name: Julie Elliott MRN: [id number] Date of Birth: [birth year] Referring Provider: Dr. Alysia Penna   Encounter Date: 08/17/2017  PT End of Session - 08/17/17 1319    Visit Number  2    Number of Visits  16    Date for PT Re-Evaluation  10/07/17    Authorization Type  Medicare/Medicaid    PT Start Time  1937    PT Stop Time  1402    PT Time Calculation (min)  45 min    Equipment Utilized During Treatment  Gait belt    Activity Tolerance  Patient tolerated treatment well    Behavior During Therapy  [facility] for tasks assessed/performed       Past Medical History:  Diagnosis Date  . CHF (congestive heart failure) ([facility])   . Chronic headaches   . DJD (degenerative joint disease)   . DM (diabetes mellitus) ([facility]) 2008   stable HgBA1C at 6.5  . GERD (gastroesophageal reflux disease)    well controlled on Omeprazole  . HLD (hyperlipidemia) 2008   stable, well controlled  . Hypertension    well controlled    Past Surgical History:  Procedure Laterality Date  . carpel tunnel Right    20 yrs ago  . CERVICAL SPINE SURGERY  2016  . TUBAL LIGATION      There were no vitals filed for this visit.  Subjective Assessment - 08/17/17 1318    Subjective  No new complaints. No falls or pain to report.     Patient is accompained by:  Family member    Limitations  Walking    Patient Stated Goals  improve balance, negotiate stairs safely    Currently in Pain?  No/denies         [facility] PT Assessment - 08/17/17 1327      Standardized Balance Assessment   Standardized Balance Assessment  Berg Balance Test      Berg Balance Test   Sit to Stand  Able to stand  independently using hands    Standing Unsupported  Able to stand 2 minutes with supervision    Sitting with Back Unsupported but  Feet Supported on Floor or Stool  Able to sit safely and securely 2 minutes    Stand to Sit  Uses backs of legs against chair to control descent    Transfers  Able to transfer with verbal cueing and /or supervision    Standing Unsupported with Eyes Closed  Able to stand 10 seconds with supervision    Standing Ubsupported with Feet Together  Able to place feet together independently and stand for 1 minute with supervision    From Standing, Reach Forward with Outstretched Arm  Can reach forward >5 cm safely (2") 2 inches    From Standing Position, Pick up Object from Floor  Able to pick up shoe, needs supervision    From Standing Position, Turn to Look Behind Over each Shoulder  Turn sideways only but maintains balance    Turn 360 Degrees  Needs close supervision or verbal cueing > 9 seconds each way    Standing Unsupported, Alternately Place Feet on Step/Stool  Able to complete >2 steps/needs minimal assist    Standing Unsupported, One Foot in Front  Able to take small step independently and hold 30 seconds    Standing on One  Leg  Tries to lift leg/unable to hold 3 seconds but remains standing independently    Total Score  32      issued the following to HEP today with min cues on form and technique.   Hamstring Stretch, Seated (Strap, Two Chairs)    Sit with one leg extended onto facing chair.  1. Try to get knee as straight as possible (toes and knee straight - not pointing inward/outward); then hinged forward at waist to feel a stretch in the back of your leg. Hold for _30-60__ seconds, __2-3__ times per day on each leg.  2. Loop strap/belt over outstretched foot (as pictured). Keeping foot aligned straight, pull belt to feel gentle stretch in back of lower leg. Hold for 60 seconds, 2-3 times per day on each leg.  Hip Flexor Stretch    Lying on back near edge of (low) couch, bend one leg, foot flat. Hang leg over edge with knee bent and thigh resting on bed. Hold for 30-60 seconds,  2-3 times per day on each leg.    Functional Quadriceps: Sit to Stand    Sit on edge of chair, feet flat on floor. Scoot to edge of chair/bed. Be sure something stable is in front of you (in case you lose your balance forward). Try to stand up without using your hands. Repeat __10__ times per set. Do __2__ sets per day.  Feet Together (Compliant Surface) Head Motion - Eyes Closed    Stand with your back to a corner with a stable chair in front of you. Stand on 1 pillow with feet together. Close eyes and move head slowly: up and down 10 times; right to left 10 times. Repeat _2___ times per day. Single Leg Stance    Stand with your back to a corner with a stable chair in front of you. Raise one leg. Hold _10__ seconds. Repeat with other leg. Try this 8-10 times per day on each leg.       PT Education - 08/17/17 1400    Education provided  Yes    Education Details  results of [employer]; re-issued after performance in session HEP from previous episode of care    Person(s) Educated  Patient    Methods  Explanation;Demonstration;Verbal cues;Handout    Comprehension  Verbalized understanding;Returned demonstration;Verbal cues required;Need further instruction       PT Short Term Goals - 08/18/17 1328      PT SHORT TERM GOAL #1   Title  independent with initial HEP. (all STGs due 09/09/17)    Status  On-going      PT SHORT TERM GOAL #2   Title  improve gait velocity to > 1.4 ft/sec for improved function    Status  On-going      PT SHORT TERM GOAL #3   Title  perform BERG with STG and LTG to be written    Baseline  08/17/17: 32/56 today for baseline value. primary PT to set goals.     Status  Achieved      PT SHORT TERM GOAL #4   Title  amb > 250' with LRAD with supervision for improved mobility and function      PT SHORT TERM GOAL #5   Title  Pt will consistently perform sit <> stand from standard chair with mod I, normal BOS, and symmetrical LE placement.     Status  On-going        PT Long Term Goals - 08/12/17 1544  PT LONG TERM GOAL #1   Title  Pt will independently demo HEP to maximize functional gains made in PT.    Status  New    Target Date  10/07/17      PT LONG TERM GOAL #2   Title  improve gait velocity to > 1.8 ft/sec for decreased fall risk    Status  New    Target Date  10/07/17      PT LONG TERM GOAL #3   Title  improve timed up and go to < 20 sec with LRAD for improved function and mobility    Status  New    Target Date  10/07/17      PT LONG TERM GOAL #4   Title  amb > 400' on indoor/paved outdoor surfaces with LRAD mod I for improved community access    Status  New    Target Date  10/07/17      PT LONG TERM GOAL #5   Title  negotiate stairs with supervision for improved functional mobility    Status  New    Target Date  10/07/17            Plan - 08/17/17 1319    Clinical Impression Statement  Today's skilled session established baseline values for Berg Balance Test with primary PT to set goals. Remainder of session reviewed HEP issued at previous episode of care with cues needed on form and technique. All exercises remain appropriate and reissued today as pt was not performing all of them currently. Pt should benefit from continued PT to progress toward unmet goals.     Rehab Potential  Fair    PT Frequency  2x / week    PT Duration  8 weeks    PT Treatment/Interventions  ADLs/Self Care Home Management;Cryotherapy;Electrical Stimulation;Moist Heat;Therapeutic exercise;Therapeutic activities;Functional mobility training;Stair training;Patient/family education;Gait training;DME Instruction;Ultrasound;Balance training;Neuromuscular re-education;Orthotic Fit/Training;Manual techniques;Taping    PT Next Visit Plan  try gait with SPC/quad continue to work on strengthening and standing balance activities    Consulted and Agree with Plan of Care  Patient       Patient will benefit from skilled therapeutic  intervention in order to improve the following deficits and impairments:  Abnormal gait, Pain, Increased muscle spasms, Increased fascial restricitons, Decreased strength, Difficulty walking, Decreased balance, Impaired flexibility, Decreased range of motion, Hypomobility, Decreased knowledge of use of DME, Decreased mobility  Visit Diagnosis: Other abnormalities of gait and mobility  Unsteadiness on feet  Muscle weakness (generalized)     Problem List Patient Active Problem List   Diagnosis Date Noted  . Breast cancer screening by mammogram 04/28/2017  . 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 Tobacco abuse 08/16/2015  . Peripheral neuropathy 07/17/2015  . Cervical cord compression with myelopathy ([place]) 02/08/2015  . Frozen shoulder--left 09/05/2014  . Unintentional weight loss 06/20/2014  . Health care maintenance 03/23/2012  . Essential hypertension 08/17/2006  . GERD 08/17/2006  . History of diabetes mellitus, type II 08/11/2006  . Hyperlipidemia associated with type 2 diabetes mellitus ([facility]) 08/11/2006  . Osteoarthritis 08/11/2006    Willow Ora, PTA, [facility] [address] [place], [place] [address] [phone] 08/18/17, 1:29 PM   Name: Arelia Volpe MRN: [id number] Date of Birth: 02-Dec-1955

## 2017-08-21 ENCOUNTER — Encounter: Payer: Self-pay | Admitting: Physical Therapy

## 2017-08-21 ENCOUNTER — Ambulatory Visit: Payer: Medicare Other | Admitting: Physical Therapy

## 2017-08-21 DIAGNOSIS — R2689 Other abnormalities of gait and mobility: Secondary | ICD-10-CM

## 2017-08-21 DIAGNOSIS — R2681 Unsteadiness on feet: Secondary | ICD-10-CM | POA: Diagnosis not present

## 2017-08-21 DIAGNOSIS — M6249 Contracture of muscle, multiple sites: Secondary | ICD-10-CM | POA: Diagnosis not present

## 2017-08-21 DIAGNOSIS — M6281 Muscle weakness (generalized): Secondary | ICD-10-CM | POA: Diagnosis not present

## 2017-08-21 DIAGNOSIS — M25612 Stiffness of left shoulder, not elsewhere classified: Secondary | ICD-10-CM | POA: Diagnosis not present

## 2017-08-22 NOTE — Therapy (Signed)
[facility] [address] [place], [place], [address] Phone: [phone]   Fax:  [phone]  Physical Therapy Treatment  Patient Details  Name: Julie Elliott MRN: [id number] Date of Birth: January 17, 1956 Referring Provider: Dr. Alysia Penna   Encounter Date: 08/21/2017  PT End of Session - 08/21/17 1539    Visit Number  3    Number of Visits  16    Date for PT Re-Evaluation  10/07/17    Authorization Type  Medicare/Medicaid    PT Start Time  2979    PT Stop Time  8921    PT Time Calculation (min)  41 min    Equipment Utilized During Treatment  Gait belt    Activity Tolerance  Patient tolerated treatment well;No increased pain    Behavior During Therapy  WFL for tasks assessed/performed       Past Medical History:  Diagnosis Date  . CHF (congestive heart failure) ([place])   . Chronic headaches   . DJD (degenerative joint disease)   . DM (diabetes mellitus) ([place]) 2008   stable HgBA1C at 6.5  . GERD (gastroesophageal reflux disease)    well controlled on Omeprazole  . HLD (hyperlipidemia) 2008   stable, well controlled  . Hypertension    well controlled    Past Surgical History:  Procedure Laterality Date  . carpel tunnel Right    20 yrs ago  . CERVICAL SPINE SURGERY  2016  . TUBAL LIGATION      There were no vitals filed for this visit.  Subjective Assessment - 08/21/17 1538    Subjective  No new complaints. No falls or pain to report. Does report some near falls due to right ankle rolling out and right knee buckling, one today when approaching the car to get in for therapy. States the HEP is going well.     Patient is accompained by:  Family member    Limitations  Walking    Patient Stated Goals  improve balance, negotiate stairs safely    Currently in Pain?  No/denies        [facility] Adult PT Treatment/Exercise - 08/21/17 1540      Transfers   Transfers  Sit to Stand;Stand to Sit      Ambulation/Gait   Ambulation/Gait  Yes    Ambulation/Gait Assistance  4: Min guard    Ambulation/Gait Assistance Details  use of small based quad cane for 100 feet, then 115 feet with min guard assist. pt tends to only touch one point of cane to floor at a time with gait, needing cues for safety with cane use. Pt also holds cane on right side (weaker side) due to decr grip on left hand. cues for sequencing needed to more offload right leg. trialed gait with straight cane with rubber quad tip with cues on sequencing. pt reports it feels "too light", otherwise no issues.     Ambulation Distance (Feet)  100 Feet x1, 115 x2    Assistive device  Small based quad cane    Gait Pattern  Decreased step length - right;Decreased stance time - left;Decreased hip/knee flexion - left;Decreased hip/knee flexion - right;Decreased dorsiflexion - right;Decreased dorsiflexion - left;Abducted - left;Poor foot clearance - left    Ambulation Surface  Level;Indoor      Knee/Hip Exercises: Aerobic   Nustep  Legs only only on level 1 for 5 minutes with goal >/=30 steps for strengthening and activity tolerance.       Knee/Hip  Exercises: Seated   Long Arc Quad  Strengthening;Both;2 sets;10 reps;Limitations;Weights    Long Arc Quad Weight  2 lbs.    Long [employer] Limitations  cues to maintain upright posture, for full knee extension and for slow, controlled movements    Hamstring Curl  AROM;Strengthening;Both;2 sets;10 reps;Limitations    Hamstring Limitations  with red band resistance: cues for slow/controlled movements and to maintain tall posture          PT Short Term Goals - 08/18/17 1328      PT SHORT TERM GOAL #1   Title  independent with initial HEP. (all STGs due 09/09/17)    Status  On-going      PT SHORT TERM GOAL #2   Title  improve gait velocity to > 1.4 ft/sec for improved function    Status  On-going      PT SHORT TERM GOAL #3   Title  perform BERG with STG and LTG to be written    Baseline   08/17/17: 32/56 today for baseline value. primary PT to set goals.     Status  Achieved      PT SHORT TERM GOAL #4   Title  amb > 250' with LRAD with supervision for improved mobility and function      PT SHORT TERM GOAL #5   Title  Pt will consistently perform sit <> stand from standard chair with mod I, normal BOS, and symmetrical LE placement.    Status  On-going        PT Long Term Goals - 08/12/17 1544      PT LONG TERM GOAL #1   Title  Pt will independently demo HEP to maximize functional gains made in PT.    Status  New    Target Date  10/07/17      PT LONG TERM GOAL #2   Title  improve gait velocity to > 1.8 ft/sec for decreased fall risk    Status  New    Target Date  10/07/17      PT LONG TERM GOAL #3   Title  improve timed up and go to < 20 sec with LRAD for improved function and mobility    Status  New    Target Date  10/07/17      PT LONG TERM GOAL #4   Title  amb > 400' on indoor/paved outdoor surfaces with LRAD mod I for improved community access    Status  New    Target Date  10/07/17      PT LONG TERM GOAL #5   Title  negotiate stairs with supervision for improved functional mobility    Status  New    Target Date  10/07/17            Plan - 08/21/17 1539    Clinical Impression Statement  Today's skilled session addressed gait with small based quad cane and straight cane with quad tip. Pt needs cues on posture, step length and weight shifting with both devices. Safer with straight cane as she tends to only use one tip of the quad cane with gait, however pt does not like how light the straight cane feels. Remainder of session focused on LE strengthening due to pt reporting episodes of knees "buckling". Have not seen any episodes of knee bucking in PT sessions as yet. Pt is making progress toward goals and should benefit from continued PT to progress toward unmet goals.     Rehab Potential  Fair    PT Frequency  2x / week    PT Duration  8 weeks    PT  Treatment/Interventions  ADLs/Self Care Home Management;Cryotherapy;Electrical Stimulation;Moist Heat;Therapeutic exercise;Therapeutic activities;Functional mobility training;Stair training;Patient/family education;Gait training;DME Instruction;Ultrasound;Balance training;Neuromuscular re-education;Orthotic Fit/Training;Manual techniques;Taping    PT Next Visit Plan  continue to work on gait with SPC/quad cane, continue to work on strengthening and standing balance activities    Consulted and Agree with Plan of Care  Patient       Patient will benefit from skilled therapeutic intervention in order to improve the following deficits and impairments:  Abnormal gait, Pain, Increased muscle spasms, Increased fascial restricitons, Decreased strength, Difficulty walking, Decreased balance, Impaired flexibility, Decreased range of motion, Hypomobility, Decreased knowledge of use of DME, Decreased mobility  Visit Diagnosis: Other abnormalities of gait and mobility  Unsteadiness on feet  Muscle weakness (generalized)     Problem List Patient Active Problem List   Diagnosis Date Noted  . Breast cancer screening by mammogram 04/28/2017  . 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 Tobacco abuse 08/16/2015  . Peripheral neuropathy 07/17/2015  . Cervical cord compression with myelopathy ([place]) 02/08/2015  . Frozen shoulder--left 09/05/2014  . Unintentional weight loss 06/20/2014  . Health care maintenance 03/23/2012  . Essential hypertension 08/17/2006  . GERD 08/17/2006  . History of diabetes mellitus, type II 08/11/2006  . Hyperlipidemia associated with type 2 diabetes mellitus ([place]) 08/11/2006  . Osteoarthritis 08/11/2006    Willow Ora, PTA, [facility] [address] [place], [place] [address] [phone] 08/22/17, 6:35 PM   Name: Julie Elliott MRN: [id number] Date of Birth: February 03, 1956

## 2017-08-24 ENCOUNTER — Encounter: Payer: Self-pay | Admitting: Physical Therapy

## 2017-08-24 ENCOUNTER — Ambulatory Visit: Payer: Medicare Other | Admitting: Physical Therapy

## 2017-08-24 DIAGNOSIS — M25612 Stiffness of left shoulder, not elsewhere classified: Secondary | ICD-10-CM | POA: Diagnosis not present

## 2017-08-24 DIAGNOSIS — M6281 Muscle weakness (generalized): Secondary | ICD-10-CM

## 2017-08-24 DIAGNOSIS — R2681 Unsteadiness on feet: Secondary | ICD-10-CM | POA: Diagnosis not present

## 2017-08-24 DIAGNOSIS — M6249 Contracture of muscle, multiple sites: Secondary | ICD-10-CM | POA: Diagnosis not present

## 2017-08-24 DIAGNOSIS — R2689 Other abnormalities of gait and mobility: Secondary | ICD-10-CM

## 2017-08-24 NOTE — Therapy (Signed)
[facility] [address] [place], [place], [address] Phone: [phone]   Fax:  [phone]  Physical Therapy Treatment  Patient Details  Name: Julie Elliott MRN: [id number] Date of Birth: 06-17-1956 Referring Provider: Dr. Alysia Penna   Encounter Date: 08/24/2017  PT End of Session - 08/24/17 1535    Visit Number  4    Number of Visits  16    Date for PT Re-Evaluation  10/07/17    Authorization Type  Medicare/Medicaid    PT Start Time  1532    PT Stop Time  1611    PT Time Calculation (min)  39 min    Equipment Utilized During Treatment  Gait belt    Activity Tolerance  Patient tolerated treatment well;Patient limited by fatigue    Behavior During Therapy  [facility] for tasks assessed/performed       Past Medical History:  Diagnosis Date  . CHF (congestive heart failure) ([place])   . Chronic headaches   . DJD (degenerative joint disease)   . DM (diabetes mellitus) ([place]) 2008   stable HgBA1C at 6.5  . GERD (gastroesophageal reflux disease)    well controlled on Omeprazole  . HLD (hyperlipidemia) 2008   stable, well controlled  . Hypertension    well controlled    Past Surgical History:  Procedure Laterality Date  . carpel tunnel Right    20 yrs ago  . CERVICAL SPINE SURGERY  2016  . TUBAL LIGATION      There were no vitals filed for this visit.  Subjective Assessment - 08/24/17 1535    Subjective  doing well; is now able to get in and out of car by herself without assistance.    Patient Stated Goals  improve balance, negotiate stairs safely    Currently in Pain?  No/denies                      [facility] Adult PT Treatment/Exercise - 08/24/17 1536      Ambulation/Gait   Ambulation/Gait  Yes    Ambulation/Gait Assistance  5: Supervision;4: Min guard    Ambulation Distance (Feet)  230 Feet    Assistive device  Straight cane with quad tip    Gait Pattern  Decreased step length -  right;Decreased stance time - left;Decreased hip/knee flexion - left;Decreased hip/knee flexion - right;Decreased dorsiflexion - right;Decreased dorsiflexion - left;Abducted - left;Poor foot clearance - left    Gait Comments  continued practice with quad tip SPC: pt demonstrated safe mobility with quad tip: pt wanting to continue with [facility]      Knee/Hip Exercises: Aerobic   Nustep  LEs only x   min; level 2      Knee/Hip Exercises: Standing   Forward Step Up  Both;10 reps;Hand Hold: 2;Step Height: 4"    Gait Training  sidestepping and backwards walking with 1 UE support               PT Short Term Goals - 08/18/17 1328      PT SHORT TERM GOAL #1   Title  independent with initial HEP. (all STGs due 09/09/17)    Status  On-going      PT SHORT TERM GOAL #2   Title  improve gait velocity to > 1.4 ft/sec for improved function    Status  On-going      PT SHORT TERM GOAL #3   Title  perform BERG with STG and LTG to be  written    Baseline  08/17/17: 32/56 today for baseline value. primary PT to set goals.     Status  Achieved      PT SHORT TERM GOAL #4   Title  amb > 250' with LRAD with supervision for improved mobility and function      PT SHORT TERM GOAL #5   Title  Pt will consistently perform sit <> stand from standard chair with mod I, normal BOS, and symmetrical LE placement.    Status  On-going        PT Long Term Goals - 08/12/17 1544      PT LONG TERM GOAL #1   Title  Pt will independently demo HEP to maximize functional gains made in PT.    Status  New    Target Date  10/07/17      PT LONG TERM GOAL #2   Title  improve gait velocity to > 1.8 ft/sec for decreased fall risk    Status  New    Target Date  10/07/17      PT LONG TERM GOAL #3   Title  improve timed up and go to < 20 sec with LRAD for improved function and mobility    Status  New    Target Date  10/07/17      PT LONG TERM GOAL #4   Title  amb > 400' on indoor/paved outdoor surfaces with LRAD mod  I for improved community access    Status  New    Target Date  10/07/17      PT LONG TERM GOAL #5   Title  negotiate stairs with supervision for improved functional mobility    Status  New    Target Date  10/07/17            Plan - 08/24/17 1611    Clinical Impression Statement  Increased time needed today between activities due to fatigue but tolerated standing activities well today.  Pt enjoyed NuStep last session and requested to repeat.  Briefly discussed community resources and access to NuStep at gym.  Will further elaborate if pt is interested.      PT Treatment/Interventions  ADLs/Self Care Home Management;Cryotherapy;Electrical Stimulation;Moist Heat;Therapeutic exercise;Therapeutic activities;Functional mobility training;Stair training;Patient/family education;Gait training;DME Instruction;Ultrasound;Balance training;Neuromuscular re-education;Orthotic Fit/Training;Manual techniques;Taping    PT Next Visit Plan  continue to work on gait with SPC/quad cane, continue to work on strengthening and standing balance activities    Consulted and Agree with Plan of Care  Patient       Patient will benefit from skilled therapeutic intervention in order to improve the following deficits and impairments:  Abnormal gait, Pain, Increased muscle spasms, Increased fascial restricitons, Decreased strength, Difficulty walking, Decreased balance, Impaired flexibility, Decreased range of motion, Hypomobility, Decreased knowledge of use of DME, Decreased mobility  Visit Diagnosis: Other abnormalities of gait and mobility  Unsteadiness on feet  Muscle weakness (generalized)     Problem List Patient Active Problem List   Diagnosis Date Noted  . Breast cancer screening by mammogram 04/28/2017  . 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 Tobacco abuse 08/16/2015  . Peripheral neuropathy 07/17/2015  . Cervical cord compression with myelopathy ([place]) 02/08/2015  . Frozen shoulder--left  09/05/2014  . Unintentional weight loss 06/20/2014  . Health care maintenance 03/23/2012  . Essential hypertension 08/17/2006  . GERD 08/17/2006  . History of diabetes mellitus, type II 08/11/2006  . Hyperlipidemia associated with type 2 diabetes mellitus ([place]) 08/11/2006  . Osteoarthritis 08/11/2006  Laureen Abrahams, PT, DPT 08/24/17 4:14 PM    [facility] [address] [place], [place], [address] Phone: [phone]   Fax:  [phone]  Name: Julie Elliott MRN: [id number] Date of Birth: 1956-05-29

## 2017-08-25 ENCOUNTER — Encounter: Payer: Self-pay | Admitting: Internal Medicine

## 2017-08-26 ENCOUNTER — Ambulatory Visit: Payer: Medicare Other | Admitting: Physical Therapy

## 2017-08-26 ENCOUNTER — Encounter: Payer: Self-pay | Admitting: Physical Therapy

## 2017-08-26 VITALS — BP 143/76 | HR 101

## 2017-08-26 DIAGNOSIS — R2689 Other abnormalities of gait and mobility: Secondary | ICD-10-CM | POA: Diagnosis not present

## 2017-08-26 DIAGNOSIS — M25612 Stiffness of left shoulder, not elsewhere classified: Secondary | ICD-10-CM | POA: Diagnosis not present

## 2017-08-26 DIAGNOSIS — M6281 Muscle weakness (generalized): Secondary | ICD-10-CM

## 2017-08-26 DIAGNOSIS — M6249 Contracture of muscle, multiple sites: Secondary | ICD-10-CM | POA: Diagnosis not present

## 2017-08-26 DIAGNOSIS — R2681 Unsteadiness on feet: Secondary | ICD-10-CM

## 2017-08-26 NOTE — Therapy (Signed)
[facility] [address] [place], [place], [address] Phone: [phone]   Fax:  [phone]  Physical Therapy Treatment  Patient Details  Name: Julie Elliott MRN: [id number] Date of Birth: [birth year] Referring Provider: Dr. Alysia Penna   Encounter Date: 08/26/2017  PT End of Session - 08/26/17 1606    Visit Number  5    Number of Visits  16    Date for PT Re-Evaluation  10/07/17    Authorization Type  Medicare/Medicaid    PT Start Time  1527    PT Stop Time  1608    PT Time Calculation (min)  41 min    Equipment Utilized During Treatment  Gait belt    Activity Tolerance  Patient tolerated treatment well;Patient limited by fatigue    Behavior During Therapy  [facility] for tasks assessed/performed       Past Medical History:  Diagnosis Date  . CHF (congestive heart failure) ([place])   . Chronic headaches   . DJD (degenerative joint disease)   . DM (diabetes mellitus) ([place]) 2008   stable HgBA1C at 6.5  . GERD (gastroesophageal reflux disease)    well controlled on Omeprazole  . HLD (hyperlipidemia) 2008   stable, well controlled  . Hypertension    well controlled    Past Surgical History:  Procedure Laterality Date  . carpel tunnel Right    20 yrs ago  . CERVICAL SPINE SURGERY  2016  . TUBAL LIGATION      Vitals:   08/26/17 1559  BP: (!) 143/76  Pulse: (!) 101  SpO2: 99%    Subjective Assessment - 08/26/17 1531    Subjective  no new c/o or reports.      Patient is accompained by:  Family member    Patient Stated Goals  improve balance, negotiate stairs safely    Currently in Pain?  No/denies                      [facility] Adult PT Treatment/Exercise - 08/26/17 1532      Self-Care   Self-Care  Other Self-Care Comments    Other Self-Care Comments   monitoring daily weights and checking for increased edema in bil LEs; pt with hx of CHF but denies current SOB.  reoommended she monitor  symptoms and call MD if symptoms get worse      Knee/Hip Exercises: Stretches   [employer] Limitations  attempted but pt reports she was unable to feel and noted increased edema in bil LEs      Knee/Hip Exercises: Aerobic   Nustep  LEs only x 5  min; level 3      Knee/Hip Exercises: Standing   Lateral Step Up  Both;5 reps;Hand Hold: 2;Step Height: 4"    Forward Step Up  Both;10 reps;Hand Hold: 2;Step Height: 4"    Functional Squat  2 sets;10 reps cues for decreased trunk flexion and more hip/knee flexion      Knee/Hip Exercises: Seated   Sit to Sand  5 reps;without UE support;10 reps;with UE support from chair with blue foam             PT Education - 08/26/17 1606    Education provided  Yes    Education Details  see self care    Person(s) Educated  Patient    Methods  Explanation    Comprehension  Verbalized understanding       PT Short Term Goals - 08/26/17  [place] #1   Title  independent with initial HEP. (all STGs due 09/09/17)    Status  On-going      PT SHORT TERM GOAL #2   Title  improve gait velocity to > 1.4 ft/sec for improved function    Status  On-going      PT SHORT TERM GOAL #3   Title  perform BERG with STG and LTG to be written    Status  On-going      PT SHORT TERM GOAL #4   Title  amb > 250' with LRAD with supervision for improved mobility and function    Status  On-going      PT SHORT TERM GOAL #5   Title  Pt will consistently perform sit <> stand from standard chair with mod I, normal BOS, and symmetrical LE placement.    Status  On-going      PT SHORT TERM GOAL #6   Title  improve BERG balance score to >/= 38/56 for improved balance    Status  New    Target Date  09/09/17        PT Long Term Goals - 08/26/17 1608      PT LONG TERM GOAL #1   Title  Pt will independently demo HEP to maximize functional gains made in PT.    Status  On-going    Target Date  10/07/17      PT LONG TERM GOAL #2   Title   improve gait velocity to > 1.8 ft/sec for decreased fall risk    Status  On-going    Target Date  10/07/17      PT LONG TERM GOAL #3   Title  improve timed up and go to < 20 sec with LRAD for improved function and mobility    Status  On-going    Target Date  10/07/17      PT LONG TERM GOAL #4   Title  amb > 400' on indoor/paved outdoor surfaces with LRAD mod I for improved community access    Status  On-going    Target Date  10/07/17      PT LONG TERM GOAL #5   Title  negotiate stairs with supervision for improved functional mobility    Status  On-going    Target Date  10/07/17      PT LONG TERM GOAL #6   Title  improve BERG balance score to >/= 45/56 for improved balance and decreased fall risk.    Status  New    Target Date  10/07/17            Plan - 08/26/17 1611    Clinical Impression Statement  Pt continued to have increased fatigue today and c/o tightness in her legs.  Edema noted in bil LEs today, and pt with hx of CHF.  She denies any SOB and BP stable, but advised to monitor and perform daily weight checks to see if she's experiencing an increase in fluid.  Pt to monitor.  Will continue to benefit from PT to maximize function.    PT Treatment/Interventions  ADLs/Self Care Home Management;Cryotherapy;Electrical Stimulation;Moist Heat;Therapeutic exercise;Therapeutic activities;Functional mobility training;Stair training;Patient/family education;Gait training;DME Instruction;Ultrasound;Balance training;Neuromuscular re-education;Orthotic Fit/Training;Manual techniques;Taping    PT Next Visit Plan  continue to work on gait with SPC/quad cane, continue to work on strengthening and standing balance activities    Consulted and Agree with Plan of Care  Patient  Patient will benefit from skilled therapeutic intervention in order to improve the following deficits and impairments:  Abnormal gait, Pain, Increased muscle spasms, Increased fascial restricitons, Decreased  strength, Difficulty walking, Decreased balance, Impaired flexibility, Decreased range of motion, Hypomobility, Decreased knowledge of use of DME, Decreased mobility  Visit Diagnosis: Other abnormalities of gait and mobility  Unsteadiness on feet  Muscle weakness (generalized)     Problem List Patient Active Problem List   Diagnosis Date Noted  . Breast cancer screening by mammogram 04/28/2017  . 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 Tobacco abuse 08/16/2015  . Peripheral neuropathy 07/17/2015  . Cervical cord compression with myelopathy ([place]) 02/08/2015  . Frozen shoulder--left 09/05/2014  . Unintentional weight loss 06/20/2014  . Health care maintenance 03/23/2012  . Essential hypertension 08/17/2006  . GERD 08/17/2006  . History of diabetes mellitus, type II 08/11/2006  . Hyperlipidemia 08/11/2006  . Osteoarthritis 08/11/2006      Laureen Abrahams, PT, DPT 08/26/17 4:14 PM     [facility] [address] [place], [place], [address] Phone: [phone]   Fax:  [phone]  Name: Schyler Butikofer MRN: [id number] Date of Birth: [birth year]

## 2017-08-28 ENCOUNTER — Other Ambulatory Visit: Payer: Self-pay | Admitting: Neurology

## 2017-08-28 DIAGNOSIS — M4712 Other spondylosis with myelopathy, cervical region: Secondary | ICD-10-CM

## 2017-08-31 ENCOUNTER — Ambulatory Visit: Payer: Medicare Other | Admitting: Physical Therapy

## 2017-08-31 ENCOUNTER — Telehealth: Payer: Self-pay | Admitting: *Deleted

## 2017-08-31 NOTE — Telephone Encounter (Signed)
Faxed signed prescription for Lyrica to [facility]. Received a receipt of confirmation.

## 2017-09-02 ENCOUNTER — Ambulatory Visit: Payer: Medicare Other | Admitting: Physical Therapy

## 2017-09-02 ENCOUNTER — Encounter: Payer: Self-pay | Admitting: Physical Therapy

## 2017-09-02 DIAGNOSIS — R2681 Unsteadiness on feet: Secondary | ICD-10-CM | POA: Diagnosis not present

## 2017-09-02 DIAGNOSIS — M6249 Contracture of muscle, multiple sites: Secondary | ICD-10-CM | POA: Diagnosis not present

## 2017-09-02 DIAGNOSIS — R2689 Other abnormalities of gait and mobility: Secondary | ICD-10-CM

## 2017-09-02 DIAGNOSIS — M25612 Stiffness of left shoulder, not elsewhere classified: Secondary | ICD-10-CM | POA: Diagnosis not present

## 2017-09-02 DIAGNOSIS — M6281 Muscle weakness (generalized): Secondary | ICD-10-CM

## 2017-09-04 NOTE — Therapy (Signed)
[facility] [address] [place], [place], [address] Phone: [phone]   Fax:  [phone]  Physical Therapy Treatment  Patient Details  Name: Julie Elliott MRN: [id number] Date of Birth: September 29, 1955 Referring Provider: Dr. Alysia Penna   Encounter Date: 09/02/2017   09/02/17 1538  PT Visits / Re-Eval  Visit Number 6  Number of Visits 16  Date for PT Re-Evaluation 10/07/17  Authorization  Authorization Type Medicare/Medicaid  PT Time Calculation  PT Start Time 1532  PT Stop Time 1615  PT Time Calculation (min) 43 min  PT - End of Session  Equipment Utilized During Treatment Gait belt  Activity Tolerance Patient tolerated treatment well;Patient limited by fatigue  Behavior During Therapy [facility] for tasks assessed/performed     Past Medical History:  Diagnosis Date  . CHF (congestive heart failure) ([place])   . Chronic headaches   . DJD (degenerative joint disease)   . DM (diabetes mellitus) ([place]) 2008   stable HgBA1C at 6.5  . GERD (gastroesophageal reflux disease)    well controlled on Omeprazole  . HLD (hyperlipidemia) 2008   stable, well controlled  . Hypertension    well controlled    Past Surgical History:  Procedure Laterality Date  . carpel tunnel Right    20 yrs ago  . CERVICAL SPINE SURGERY  2016  . TUBAL LIGATION      There were no vitals filed for this visit.     09/02/17 1537  Symptoms/Limitations  Subjective No new complaints. No falls or pain to report.   Patient is accompained by: Family member  Limitations Walking  Patient Stated Goals improve balance, negotiate stairs safely  Pain Assessment  Currently in Pain? No/denies      09/02/17 1539  Transfers  Transfers Sit to Stand;Stand to Sit  Sit to Stand 5: Supervision;With upper extremity assist;From chair/3-in-1  Stand to Sit 5: Supervision;With upper extremity assist;To chair/3-in-1;To bed  Ambulation/Gait  Ambulation/Gait  Yes  Ambulation/Gait Assistance 5: Supervision;4: Min guard  Ambulation/Gait Assistance Details cues for posture, step length, decr base of support and for weight shifting with use of straight cane with rubber quad tip. Pt continues to prefer the quad cane which she uses as a straight cane.   Ambulation Distance (Feet) 115 Feet (x2)  Assistive device Straight cane (with rubber quad tip)  Gait Pattern Decreased step length - right;Decreased stance time - left;Decreased hip/knee flexion - left;Decreased hip/knee flexion - right;Decreased dorsiflexion - right;Decreased dorsiflexion - left;Abducted - left;Poor foot clearance - left  Ambulation Surface Level;Indoor  High Level Balance  High Level Balance Activities Side stepping;Backward walking;Marching forwards;Tandem walking  High Level Balance Comments in parallel bars with light UE support: 3-4 laps each one with cues on posutre/ex form.   Neuro Re-ed   Neuro Re-ed Details  on airex with no UE support (in bars for safety): EC no head movements, progressing to EC head movements left<>right and up<>down. min guard to min assist for balance.   Knee/Hip Exercises: Aerobic  Nustep LEs only x 5 min; level 3 with goal >/= 50 steps for strengthening/activity tolearance         PT Short Term Goals - 08/26/17 1607      PT SHORT TERM GOAL #1   Title  independent with initial HEP. (all STGs due 09/09/17)    Status  On-going      PT SHORT TERM GOAL #2   Title  improve gait velocity to > 1.4 ft/sec for  improved function    Status  On-going      PT SHORT TERM GOAL #3   Title  perform BERG with STG and LTG to be written    Status  On-going      PT SHORT TERM GOAL #4   Title  amb > 250' with LRAD with supervision for improved mobility and function    Status  On-going      PT SHORT TERM GOAL #5   Title  Pt will consistently perform sit <> stand from standard chair with mod I, normal BOS, and symmetrical LE placement.    Status  On-going       PT SHORT TERM GOAL #6   Title  improve BERG balance score to >/= 38/56 for improved balance    Status  New    Target Date  09/09/17        PT Long Term Goals - 08/26/17 1608      PT LONG TERM GOAL #1   Title  Pt will independently demo HEP to maximize functional gains made in PT.    Status  On-going    Target Date  10/07/17      PT LONG TERM GOAL #2   Title  improve gait velocity to > 1.8 ft/sec for decreased fall risk    Status  On-going    Target Date  10/07/17      PT LONG TERM GOAL #3   Title  improve timed up and go to < 20 sec with LRAD for improved function and mobility    Status  On-going    Target Date  10/07/17      PT LONG TERM GOAL #4   Title  amb > 400' on indoor/paved outdoor surfaces with LRAD mod I for improved community access    Status  On-going    Target Date  10/07/17      PT LONG TERM GOAL #5   Title  negotiate stairs with supervision for improved functional mobility    Status  On-going    Target Date  10/07/17      PT LONG TERM GOAL #6   Title  improve BERG balance score to >/= 45/56 for improved balance and decreased fall risk.    Status  New    Target Date  10/07/17         09/02/17 1538  Plan  Clinical Impression Statement Today's skilled session continued to focuse on gait with straight cane, balance and strengthening with short rest breaks needed due to fatigue. Pt is progressing toward goals and should benefit from continued PT to progress toward unmet goals.   Pt will benefit from skilled therapeutic intervention in order to improve on the following deficits Abnormal gait;Pain;Increased muscle spasms;Increased fascial restricitons;Decreased strength;Difficulty walking;Decreased balance;Impaired flexibility;Decreased range of motion;Hypomobility;Decreased knowledge of use of DME;Decreased mobility  PT Treatment/Interventions ADLs/Self Care Home Management;Cryotherapy;Electrical Stimulation;Moist Heat;Therapeutic exercise;Therapeutic  activities;Functional mobility training;Stair training;Patient/family education;Gait training;DME Instruction;Ultrasound;Balance training;Neuromuscular re-education;Orthotic Fit/Training;Manual techniques;Taping  PT Next Visit Plan continue to work on gait with SPC/quad cane, continue to work on strengthening and standing balance activities  Consulted and Agree with Plan of Care Patient      Patient will benefit from skilled therapeutic intervention in order to improve the following deficits and impairments:  Abnormal gait, Pain, Increased muscle spasms, Increased fascial restricitons, Decreased strength, Difficulty walking, Decreased balance, Impaired flexibility, Decreased range of motion, Hypomobility, Decreased knowledge of use of DME, Decreased mobility  Visit Diagnosis: Other abnormalities of gait  and mobility  Unsteadiness on feet  Muscle weakness (generalized)     Problem List Patient Active Problem List   Diagnosis Date Noted  . Breast cancer screening by mammogram 04/28/2017  . 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 Tobacco abuse 08/16/2015  . Peripheral neuropathy 07/17/2015  . Cervical cord compression with myelopathy ([place]) 02/08/2015  . Frozen shoulder--left 09/05/2014  . Unintentional weight loss 06/20/2014  . Health care maintenance 03/23/2012  . Essential hypertension 08/17/2006  . GERD 08/17/2006  . History of diabetes mellitus, type II 08/11/2006  . Hyperlipidemia 08/11/2006  . Osteoarthritis 08/11/2006    [place] Ora, PTA, [facility] [address], [place], [place] [address] [phone] 09/04/17, 8:47 AM  Name: Julie Elliott MRN: [id number] Date of Birth: October 23, 1955

## 2017-09-07 ENCOUNTER — Ambulatory Visit: Payer: Medicare Other | Attending: Physical Medicine & Rehabilitation | Admitting: Physical Therapy

## 2017-09-07 ENCOUNTER — Encounter: Payer: Self-pay | Admitting: Physical Therapy

## 2017-09-07 DIAGNOSIS — R2689 Other abnormalities of gait and mobility: Secondary | ICD-10-CM | POA: Diagnosis not present

## 2017-09-07 DIAGNOSIS — M6281 Muscle weakness (generalized): Secondary | ICD-10-CM | POA: Diagnosis not present

## 2017-09-07 DIAGNOSIS — R2681 Unsteadiness on feet: Secondary | ICD-10-CM | POA: Diagnosis not present

## 2017-09-07 NOTE — Therapy (Signed)
[facility] [address] [place], [place], [address] Phone: [phone]   Fax:  [phone]  Physical Therapy Treatment  Patient Details  Name: Julie Elliott MRN: [id number] Date of Birth: Sep 01, 1955 Referring Provider: Dr. Alysia Penna   Encounter Date: 09/07/2017  PT End of Session - 09/07/17 1536    Visit Number  7    Number of Visits  16    Date for PT Re-Evaluation  10/07/17    Authorization Type  Medicare/Medicaid    PT Start Time  1533    PT Stop Time  1615    PT Time Calculation (min)  42 min    Equipment Utilized During Treatment  Gait belt    Activity Tolerance  Patient tolerated treatment well;Patient limited by fatigue    Behavior During Therapy  [facility] for tasks assessed/performed       Past Medical History:  Diagnosis Date  . CHF (congestive heart failure) ([place])   . Chronic headaches   . DJD (degenerative joint disease)   . DM (diabetes mellitus) ([place]) 2008   stable HgBA1C at 6.5  . GERD (gastroesophageal reflux disease)    well controlled on Omeprazole  . HLD (hyperlipidemia) 2008   stable, well controlled  . Hypertension    well controlled    Past Surgical History:  Procedure Laterality Date  . carpel tunnel Right    20 yrs ago  . CERVICAL SPINE SURGERY  2016  . TUBAL LIGATION      There were no vitals filed for this visit.  Subjective Assessment - 09/07/17 1536    Subjective  No new complaints. No falls or pain to report.     Patient is accompained by:  Family member    Limitations  Walking    Patient Stated Goals  improve balance, negotiate stairs safely    Currently in Pain?  No/denies         [facility] PT Assessment - 09/07/17 1539      Transfers   Transfers  Sit to Stand;Stand to Sit    Sit to Stand  5: Supervision;With upper extremity assist;From chair/3-in-1    Sit to Stand Details (indicate cue type and reason)  continues to need a wide base of support with standing    Stand to Sit  5: Supervision;With upper extremity assist;To chair/3-in-1;To bed    Stand to Sit Details  able to sit with a narrowed base of support vs the wide base of support needed to stand      Ambulation/Gait   Ambulation/Gait  Yes    Ambulation/Gait Assistance  5: Supervision    Ambulation/Gait Assistance Details  cues on posture, step length,     Ambulation Distance (Feet)  185 Feet x1, plus around gym    Assistive device  Small based quad cane    Gait Pattern  Decreased step length - right;Decreased stance time - left;Decreased hip/knee flexion - left;Decreased hip/knee flexion - right;Decreased dorsiflexion - right;Decreased dorsiflexion - left;Abducted - left;Poor foot clearance - left    Ambulation Surface  Level;Indoor    Gait velocity  27.60 sec's= 1.19 ft/sec with small based quad cane      Standardized Balance Assessment   Standardized Balance Assessment  Berg Balance Test      Berg Balance Test   Sit to Stand  Able to stand  independently using hands    Standing Unsupported  Able to stand safely 2 minutes    Sitting with Back Unsupported but  Feet Supported on Floor or Stool  Able to sit safely and securely 2 minutes    Stand to Sit  Controls descent by using hands    Transfers  Able to transfer safely, definite need of hands    Standing Unsupported with Eyes Closed  Able to stand 10 seconds with supervision    Standing Ubsupported with Feet Together  Able to place feet together independently and stand 1 minute safely    From Standing, Reach Forward with Outstretched Arm  Can reach forward >5 cm safely (2") 4 inches    From Standing Position, Pick up Object from Floor  Able to pick up shoe, needs supervision    From Standing Position, Turn to Look Behind Over each Shoulder  Turn sideways only but maintains balance    Turn 360 Degrees  Able to turn 360 degrees safely but slowly > 8 sec's both ways    Standing Unsupported, Alternately Place Feet on Step/Stool  Able to  complete 4 steps without aid or supervision    Standing Unsupported, One Foot in Front  Able to plae foot ahead of the other independently and hold 30 seconds    Standing on One Leg  Tries to lift leg/unable to hold 3 seconds but remains standing independently    Total Score  39    Berg comment:  39/56= significant risk of falls          [facility] Adult PT Treatment/Exercise - 09/07/17 1539      Knee/Hip Exercises: Aerobic   Nustep  LEs only x 5 min; level 3 with goal >/= 50 steps for strengthening/activity tolearance                PT Short Term Goals - 09/07/17 1537      PT SHORT TERM GOAL #1   Title  independent with initial HEP. (all STGs due 09/09/17)    Baseline  09/07/17: met today    Status  Achieved      PT SHORT TERM GOAL #2   Title  improve gait velocity to > 1.4 ft/sec for improved function    Baseline  09/07/17: increased to 1.19, improved just not to goal    Status  Partially Met      PT SHORT TERM GOAL #3   Title  perform BERG with STG and LTG to be written    Status  Achieved      PT SHORT TERM GOAL #4   Title  amb > 250' with LRAD with supervision for improved mobility and function    Baseline  09/07/17: max distance of 185 feet with small based quad cane before needing to rest    Status  Partially Met      PT SHORT TERM GOAL #5   Title  Pt will consistently perform sit <> stand from standard chair with mod I, normal BOS, and symmetrical LE placement.    Baseline  09/07/17: pt continues to need a wide base of support to stand    Status  Not Met      PT SHORT TERM GOAL #6   Title  improve BERG balance score to >/= 38/56 for improved balance    Baseline  09/07/17: 39/56 scored today    Status  Achieved        PT Long Term Goals - 08/26/17 1608      PT LONG TERM GOAL #1   Title  Pt will independently demo HEP to maximize functional gains made in  PT.    Status  On-going    Target Date  10/07/17      PT LONG TERM GOAL #2   Title  improve gait velocity to  > 1.8 ft/sec for decreased fall risk    Status  On-going    Target Date  10/07/17      PT LONG TERM GOAL #3   Title  improve timed up and go to < 20 sec with LRAD for improved function and mobility    Status  On-going    Target Date  10/07/17      PT LONG TERM GOAL #4   Title  amb > 400' on indoor/paved outdoor surfaces with LRAD mod I for improved community access    Status  On-going    Target Date  10/07/17      PT LONG TERM GOAL #5   Title  negotiate stairs with supervision for improved functional mobility    Status  On-going    Target Date  10/07/17      PT LONG TERM GOAL #6   Title  improve BERG balance score to >/= 45/56 for improved balance and decreased fall risk.    Status  New    Target Date  10/07/17          Plan - 09/07/17 1536    Clinical Impression Statement  Today's session focused on progress toward STGs. 3 goals met, 1 not met and remaining were partially met. Pt should benefit from continued PT to progress toward unmet goals.     PT Treatment/Interventions  ADLs/Self Care Home Management;Cryotherapy;Electrical Stimulation;Moist Heat;Therapeutic exercise;Therapeutic activities;Functional mobility training;Stair training;Patient/family education;Gait training;DME Instruction;Ultrasound;Balance training;Neuromuscular re-education;Orthotic Fit/Training;Manual techniques;Taping    PT Next Visit Plan  continue to work on gait with SPC/quad cane, continue to work on strengthening and standing balance activities    Consulted and Agree with Plan of Care  Patient       Patient will benefit from skilled therapeutic intervention in order to improve the following deficits and impairments:  Abnormal gait, Pain, Increased muscle spasms, Increased fascial restricitons, Decreased strength, Difficulty walking, Decreased balance, Impaired flexibility, Decreased range of motion, Hypomobility, Decreased knowledge of use of DME, Decreased mobility  Visit Diagnosis: Other  abnormalities of gait and mobility  Unsteadiness on feet  Muscle weakness (generalized)     Problem List Patient Active Problem List   Diagnosis Date Noted  . Breast cancer screening by mammogram 04/28/2017  . 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 Tobacco abuse 08/16/2015  . Peripheral neuropathy 07/17/2015  . Cervical cord compression with myelopathy ([place]) 02/08/2015  . Frozen shoulder--left 09/05/2014  . Unintentional weight loss 06/20/2014  . Health care maintenance 03/23/2012  . Essential hypertension 08/17/2006  . GERD 08/17/2006  . History of diabetes mellitus, type II 08/11/2006  . Hyperlipidemia 08/11/2006  . Osteoarthritis 08/11/2006    [place] Ora, PTA, [facility] [address], [place], [place] [address] [phone] 09/07/17, 5:05 PM   Name: Nardos Putnam MRN: [id number] Date of Birth: Jan 10, 1956

## 2017-09-09 ENCOUNTER — Encounter: Payer: Self-pay | Admitting: Physical Therapy

## 2017-09-09 ENCOUNTER — Ambulatory Visit: Payer: Medicare Other | Admitting: Physical Therapy

## 2017-09-09 DIAGNOSIS — R2681 Unsteadiness on feet: Secondary | ICD-10-CM

## 2017-09-09 DIAGNOSIS — M6281 Muscle weakness (generalized): Secondary | ICD-10-CM

## 2017-09-09 DIAGNOSIS — R2689 Other abnormalities of gait and mobility: Secondary | ICD-10-CM | POA: Diagnosis not present

## 2017-09-10 NOTE — Therapy (Signed)
[facility] [address] [place], [place], [address] Phone: [phone]   Fax:  [phone]  Physical Therapy Treatment  Patient Details  Name: Julie Elliott MRN: [id number] Date of Birth: 11/27/1955 Referring Provider: Dr. Alysia Penna   Encounter Date: 09/09/2017  PT End of Session - 09/09/17 1537    Visit Number  8    Number of Visits  16    Date for PT Re-Evaluation  10/07/17    Authorization Type  Medicare/Medicaid    PT Start Time  1534    PT Stop Time  4037    PT Time Calculation (min)  40 min    Equipment Utilized During Treatment  Gait belt    Activity Tolerance  Patient tolerated treatment well;Patient limited by fatigue    Behavior During Therapy  [facility] for tasks assessed/performed       Past Medical History:  Diagnosis Date  . CHF (congestive heart failure) ([place])   . Chronic headaches   . DJD (degenerative joint disease)   . DM (diabetes mellitus) ([place]) 2008   stable HgBA1C at 6.5  . GERD (gastroesophageal reflux disease)    well controlled on Omeprazole  . HLD (hyperlipidemia) 2008   stable, well controlled  . Hypertension    well controlled    Past Surgical History:  Procedure Laterality Date  . carpel tunnel Right    20 yrs ago  . CERVICAL SPINE SURGERY  2016  . TUBAL LIGATION      There were no vitals filed for this visit.  Subjective Assessment - 09/09/17 1536    Subjective  No new complaints. No falls or pain to report.     Patient is accompained by:  Family member    Limitations  Walking    Patient Stated Goals  improve balance, negotiate stairs safely    Currently in Pain?  No/denies           [facility] Adult PT Treatment/Exercise - 09/09/17 1540      Transfers   Transfers  Sit to Stand;Stand to Sit    Sit to Stand  5: Supervision;4: Min assist    Stand to Sit  5: Supervision;4: Min assist    Number of Reps  10 reps;2 sets;Other reps (comment) 5 reps    Comments  from 22  inch mat table: with more narrowed base of support from pt's normal.  cues for anterior weight shifting and to "push down" through legs. from 21 inch high mat: with min HHA 5 reps, 2 sets with cues for anterior weight shifting and to power up through LE's.           High Level Balance   High Level Balance Activities  Side stepping;Marching forwards;Marching backwards    High Level Balance Comments  in parallel bars with light UE support: 3-4 laps each one with cues on posutre/ex form.       Neuro Re-ed    Neuro Re-ed Details   in parallel bars: using 2 inch step- single leg stance on step with contralateral LE stepping forward for heel tap then backwards for toe taps x 10 reps each leg, followed by lateral step ups x 10 reps each leg. light UE support on bil bars for balance with cues on ex form/technique.       Knee/Hip Exercises: Aerobic   Nustep  LEs only x 6 min; level 3 with goal >/= 50 steps for strengthening/activity tolearance  PT Short Term Goals - 09/07/17 1537      PT SHORT TERM GOAL #1   Title  independent with initial HEP. (all STGs due 09/09/17)    Baseline  09/07/17: met today    Status  Achieved      PT SHORT TERM GOAL #2   Title  improve gait velocity to > 1.4 ft/sec for improved function    Baseline  09/07/17: increased to 1.19, improved just not to goal    Status  Partially Met      PT SHORT TERM GOAL #3   Title  perform BERG with STG and LTG to be written    Status  Achieved      PT SHORT TERM GOAL #4   Title  amb > 250' with LRAD with supervision for improved mobility and function    Baseline  09/07/17: max distance of 185 feet with small based quad cane before needing to rest    Status  Partially Met      PT SHORT TERM GOAL #5   Title  Pt will consistently perform sit <> stand from standard chair with mod I, normal BOS, and symmetrical LE placement.    Baseline  09/07/17: pt continues to need a wide base of support to stand    Status  Not Met      PT  SHORT TERM GOAL #6   Title  improve BERG balance score to >/= 38/56 for improved balance    Baseline  09/07/17: 39/56 scored today    Status  Achieved        PT Long Term Goals - 08/26/17 1608      PT LONG TERM GOAL #1   Title  Pt will independently demo HEP to maximize functional gains made in PT.    Status  On-going    Target Date  10/07/17      PT LONG TERM GOAL #2   Title  improve gait velocity to > 1.8 ft/sec for decreased fall risk    Status  On-going    Target Date  10/07/17      PT LONG TERM GOAL #3   Title  improve timed up and go to < 20 sec with LRAD for improved function and mobility    Status  On-going    Target Date  10/07/17      PT LONG TERM GOAL #4   Title  amb > 400' on indoor/paved outdoor surfaces with LRAD mod I for improved community access    Status  On-going    Target Date  10/07/17      PT LONG TERM GOAL #5   Title  negotiate stairs with supervision for improved functional mobility    Status  On-going    Target Date  10/07/17      PT LONG TERM GOAL #6   Title  improve BERG balance score to >/= 45/56 for improved balance and decreased fall risk.    Status  New    Target Date  10/07/17            Plan - 09/09/17 1537    Clinical Impression Statement  Today's skilled session continued to focus on transfer training with decreased base of support, balance and LE strengthening without any issues reported. Pt is progressing toward goals and should benefit from contineud PT to progress toward unmet goals.     PT Treatment/Interventions  ADLs/Self Care Home Management;Cryotherapy;Electrical Stimulation;Moist Heat;Therapeutic exercise;Therapeutic activities;Functional mobility training;Stair training;Patient/family education;Gait training;DME Instruction;Ultrasound;Balance  training;Neuromuscular re-education;Orthotic Fit/Training;Manual techniques;Taping    PT Next Visit Plan  continue to work on gait with SPC/quad cane, continue to work on strengthening  and standing balance activities    Consulted and Agree with Plan of Care  Patient       Patient will benefit from skilled therapeutic intervention in order to improve the following deficits and impairments:  Abnormal gait, Pain, Increased muscle spasms, Increased fascial restricitons, Decreased strength, Difficulty walking, Decreased balance, Impaired flexibility, Decreased range of motion, Hypomobility, Decreased knowledge of use of DME, Decreased mobility  Visit Diagnosis: Other abnormalities of gait and mobility  Unsteadiness on feet  Muscle weakness (generalized)     Problem List Patient Active Problem List   Diagnosis Date Noted  . Breast cancer screening by mammogram 04/28/2017  . 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 Tobacco abuse 08/16/2015  . Peripheral neuropathy 07/17/2015  . Cervical cord compression with myelopathy ([place]) 02/08/2015  . Frozen shoulder--left 09/05/2014  . Unintentional weight loss 06/20/2014  . Health care maintenance 03/23/2012  . Essential hypertension 08/17/2006  . GERD 08/17/2006  . History of diabetes mellitus, type II 08/11/2006  . Hyperlipidemia 08/11/2006  . Osteoarthritis 08/11/2006    [place] Ora, PTA, [facility] [address], [place], [place] [address] [phone] 09/10/17, 10:30 AM   Name: Julie Elliott MRN: [id number] Date of Birth: [birth year]

## 2017-09-11 ENCOUNTER — Encounter

## 2017-09-11 ENCOUNTER — Encounter: Payer: Self-pay | Admitting: Physical Medicine & Rehabilitation

## 2017-09-11 ENCOUNTER — Ambulatory Visit (HOSPITAL_BASED_OUTPATIENT_CLINIC_OR_DEPARTMENT_OTHER): Payer: Medicare Other | Admitting: Physical Medicine & Rehabilitation

## 2017-09-11 ENCOUNTER — Encounter: Payer: Medicare Other | Attending: Physical Medicine & Rehabilitation

## 2017-09-11 VITALS — BP 143/94 | HR 99 | Resp 14

## 2017-09-11 DIAGNOSIS — M1990 Unspecified osteoarthritis, unspecified site: Secondary | ICD-10-CM | POA: Insufficient documentation

## 2017-09-11 DIAGNOSIS — E785 Hyperlipidemia, unspecified: Secondary | ICD-10-CM | POA: Insufficient documentation

## 2017-09-11 DIAGNOSIS — Z9889 Other specified postprocedural states: Secondary | ICD-10-CM | POA: Insufficient documentation

## 2017-09-11 DIAGNOSIS — I10 Essential (primary) hypertension: Secondary | ICD-10-CM | POA: Insufficient documentation

## 2017-09-11 DIAGNOSIS — F17210 Nicotine dependence, cigarettes, uncomplicated: Secondary | ICD-10-CM | POA: Insufficient documentation

## 2017-09-11 DIAGNOSIS — G8250 Quadriplegia, unspecified: Secondary | ICD-10-CM

## 2017-09-11 DIAGNOSIS — I509 Heart failure, unspecified: Secondary | ICD-10-CM | POA: Diagnosis not present

## 2017-09-11 DIAGNOSIS — R51 Headache: Secondary | ICD-10-CM | POA: Insufficient documentation

## 2017-09-11 DIAGNOSIS — K219 Gastro-esophageal reflux disease without esophagitis: Secondary | ICD-10-CM | POA: Diagnosis not present

## 2017-09-11 DIAGNOSIS — E119 Type 2 diabetes mellitus without complications: Secondary | ICD-10-CM | POA: Insufficient documentation

## 2017-09-11 NOTE — Progress Notes (Signed)
  Dysport Injection for spasticity using needle EMG guidance  Dilution: 200 Units/ml Indication: Severe spasticity which interferes with ADL,mobility and/or  hygiene and is unresponsive to medication management and other conservative care Informed consent was obtained after describing risks and benefits of the procedure with the patient. This includes bleeding, bruising, infection, excessive weakness, or medication side effects. A REMS form is on file and signed. Needle:  needle electrode Number of units per muscle Pectoralis 200 FDS 200 biceps, 200, long head  FCR, 100 Pronator teres 0  Gastroc 400 lateral head Soleus 200 All injections were done after obtaining appropriate EMG activity and after negative drawback for blood. The patient tolerated the procedure well. Post procedure instructions were given. A followup appointment was made.   We reassess clinically in 6wks Based on EMG activity may be able to reduce next injection

## 2017-09-11 NOTE — Patient Instructions (Signed)
You received a Dysport injection today. You may experience muscle pains and aches. He may apply ice 20 minutes every 2 hours as needed for the next 24-48 hours. He also noticed bleeding or bruising in the areas that were injected. May apply Band-Aid. If this bruising is extensive, please notify our office. If there is evidence of increasing redness that occurs 2-3 days after injection. Please call our office. This could be a sign of infection. It is very rare, however. You may experience some muscle weakness in the muscles  Injected and potentially non injected muscles. This would likely start in about one week.

## 2017-09-17 ENCOUNTER — Encounter: Payer: Self-pay | Admitting: Physical Therapy

## 2017-09-17 ENCOUNTER — Ambulatory Visit: Payer: Medicare Other | Admitting: Physical Therapy

## 2017-09-17 DIAGNOSIS — R2689 Other abnormalities of gait and mobility: Secondary | ICD-10-CM | POA: Diagnosis not present

## 2017-09-17 DIAGNOSIS — M6281 Muscle weakness (generalized): Secondary | ICD-10-CM | POA: Diagnosis not present

## 2017-09-17 DIAGNOSIS — R2681 Unsteadiness on feet: Secondary | ICD-10-CM

## 2017-09-17 NOTE — Therapy (Signed)
[facility] [address] [place], [place], [address] Phone: [phone]   Fax:  [phone]  Physical Therapy Treatment  Patient Details  Name: Julie Elliott MRN: [id number] Date of Birth: Jun 06, 1956 Referring Provider: Dr. Alysia Penna   Encounter Date: 09/17/2017  PT End of Session - 09/17/17 1656    Visit Number  9    Number of Visits  16    Date for PT Re-Evaluation  10/07/17    Authorization Type  Medicare/Medicaid    PT Start Time  1533    PT Stop Time  1615    PT Time Calculation (min)  42 min       Past Medical History:  Diagnosis Date  . CHF (congestive heart failure) ([place])   . Chronic headaches   . DJD (degenerative joint disease)   . DM (diabetes mellitus) ([place]) 2008   stable HgBA1C at 6.5  . GERD (gastroesophageal reflux disease)    well controlled on Omeprazole  . HLD (hyperlipidemia) 2008   stable, well controlled  . Hypertension    well controlled    Past Surgical History:  Procedure Laterality Date  . carpel tunnel Right    20 yrs ago  . CERVICAL SPINE SURGERY  2016  . TUBAL LIGATION      There were no vitals filed for this visit.  Subjective Assessment - 09/17/17 1646    Subjective  Pt reports no changes - "things are about the same"; pt received Dysport injection in Lt calf muscle last Friday (09-11-17)    Patient is accompained by:  Family member    Limitations  Walking    Patient Stated Goals  improve balance, negotiate stairs safely                      [facility] Adult PT Treatment/Exercise - 09/17/17 1548      Transfers   Transfers  Sit to Stand;Stand to Sit    Sit to Stand  4: Min assist    Sit to Stand Details  Verbal cues for technique    Sit to Stand Details (indicate cue type and reason)  pt has very wide BOS - did not use UE's to push up form mat but requested that PT stand  in front of her -  she pulled up holding on  to pT's hands    Number of Reps   Other reps (comment) 5    Comments  from mat tabel      Ambulation/Gait   Ambulation/Gait  Yes    Ambulation/Gait Assistance  5: Supervision    Ambulation/Gait Assistance Details  cues to stand erect    Ambulation Distance (Feet)  100 Feet    Assistive device  Small based quad cane    Gait Pattern  Decreased step length - right;Decreased stance time - left;Decreased hip/knee flexion - left;Decreased hip/knee flexion - right;Decreased dorsiflexion - right;Decreased dorsiflexion - left;Abducted - left;Poor foot clearance - left    Ambulation Surface  Level;Indoor    Stairs  Yes    Stair Management Technique  Two rails;Step to pattern only 1 rail used with descension - pt turned sideways    Number of Stairs  4 3 reps for 12 steps total    Height of Stairs  6    Gait Comments  Pt states she does not want to use the [facility] - feels safer with use of SBQC      Knee/Hip Exercises: Aerobic  Recumbent Bike  SciFit level 2.0 LE's only x 5"           Balance Exercises - 09/17/17 1653      Balance Exercises: Standing   Standing Eyes Opened  Wide (BOA);Head turns 3 reps inside // bars    Rockerboard  Anterior/posterior;EO;10 reps;Intermittent UE support    Sidestepping  2 reps    Other Standing Exercises  Pt performed tap ups to 2", 4" and 6" step 5 reps each:  pt touched balance bubble with RLE for improved SLS on LLE with use of SBQC for support;     Pt performed tap ups with LLE to 1st step 5 reps; attempted tap up to 2nd step with LLE but pt able to complete only 1 reps prior to fatigue  Pt performed marching inside // bars without UE support Performed standing reaching to Rt, Lt, and front without UE support -  Touching PT's hand for target; pt hesitant to lean to reach outside BOS Performed stepping over and back of black balance beam inside // bars - 5 reps with 1 UE support  Attempted sit to stand from mat with Rt foot on balance bubble for increased LLE weight bearing for  strengthening but pt stated RLE was doing more Than LLE and requested to discontinue exercise   PT Short Term Goals - 09/07/17 1537      PT SHORT TERM GOAL #1   Title  independent with initial HEP. (all STGs due 09/09/17)    Baseline  09/07/17: met today    Status  Achieved      PT SHORT TERM GOAL #2   Title  improve gait velocity to > 1.4 ft/sec for improved function    Baseline  09/07/17: increased to 1.19, improved just not to goal    Status  Partially Met      PT SHORT TERM GOAL #3   Title  perform BERG with STG and LTG to be written    Status  Achieved      PT SHORT TERM GOAL #4   Title  amb > 250' with LRAD with supervision for improved mobility and function    Baseline  09/07/17: max distance of 185 feet with small based quad cane before needing to rest    Status  Partially Met      PT SHORT TERM GOAL #5   Title  Pt will consistently perform sit <> stand from standard chair with mod I, normal BOS, and symmetrical LE placement.    Baseline  09/07/17: pt continues to need a wide base of support to stand    Status  Not Met      PT SHORT TERM GOAL #6   Title  improve BERG balance score to >/= 38/56 for improved balance    Baseline  09/07/17: 39/56 scored today    Status  Achieved        PT Long Term Goals - 08/26/17 1608      PT LONG TERM GOAL #1   Title  Pt will independently demo HEP to maximize functional gains made in PT.    Status  On-going    Target Date  10/07/17      PT LONG TERM GOAL #2   Title  improve gait velocity to > 1.8 ft/sec for decreased fall risk    Status  On-going    Target Date  10/07/17      PT LONG TERM GOAL #3   Title  improve timed  up and go to < 20 sec with LRAD for improved function and mobility    Status  On-going    Target Date  10/07/17      PT LONG TERM GOAL #4   Title  amb > 400' on indoor/paved outdoor surfaces with LRAD mod I for improved community access    Status  On-going    Target Date  10/07/17      PT LONG TERM GOAL #5    Title  negotiate stairs with supervision for improved functional mobility    Status  On-going    Target Date  10/07/17      PT LONG TERM GOAL #6   Title  improve BERG balance score to >/= 45/56 for improved balance and decreased fall risk.    Status  New    Target Date  10/07/17            Plan - 09/17/17 1657    Clinical Impression Statement  Pt reported she had some inflammation in Rt knee today which impacted her performance with sit to stand transfers from mat.  Pt reluctant to attempt sit to stand with feet closer together and initially reluctant to attempt standing balance activities inside  // bars with decreased UE support.  Pt also declines use of SPC for assistance with ambulation, stating that it is not sturdy enough for her and she prefers to use SBQC.                                                                                Rehab Potential  Fair    PT Frequency  2x / week    PT Duration  8 weeks    PT Treatment/Interventions  ADLs/Self Care Home Management;Cryotherapy;Electrical Stimulation;Moist Heat;Therapeutic exercise;Therapeutic activities;Functional mobility training;Stair training;Patient/family education;Gait training;DME Instruction;Ultrasound;Balance training;Neuromuscular re-education;Orthotic Fit/Training;Manual techniques;Taping    PT Next Visit Plan  continue to work on gait quad cane (per pt's choice), continue to work on strengthening and standing balance activities    Consulted and Agree with Plan of Care  Patient       Patient will benefit from skilled therapeutic intervention in order to improve the following deficits and impairments:  Abnormal gait, Pain, Increased muscle spasms, Increased fascial restricitons, Decreased strength, Difficulty walking, Decreased balance, Impaired flexibility, Decreased range of motion, Hypomobility, Decreased knowledge of use of DME, Decreased mobility  Visit Diagnosis: Other abnormalities of gait and  mobility  Unsteadiness on feet  Muscle weakness (generalized)     Problem List Patient Active Problem List   Diagnosis Date Noted  . Breast cancer screening by mammogram 04/28/2017  . 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 Tobacco abuse 08/16/2015  . Peripheral neuropathy 07/17/2015  . Cervical cord compression with myelopathy ([place]) 02/08/2015  . Frozen shoulder--left 09/05/2014  . Unintentional weight loss 06/20/2014  . Health care maintenance 03/23/2012  . Essential hypertension 08/17/2006  . GERD 08/17/2006  . History of diabetes mellitus, type II 08/11/2006  . Hyperlipidemia 08/11/2006  . Osteoarthritis 08/11/2006    Alda Lea, PT 09/17/2017, 5:04 PM  [facility] [address] [place], [place], [address] Phone: [phone]   Fax:  [phone]  Name: Julie Elliott MRN: [id number] Date of Birth: 09-May-1956

## 2017-09-18 ENCOUNTER — Encounter: Payer: Self-pay | Admitting: Rehabilitation

## 2017-09-18 ENCOUNTER — Ambulatory Visit: Payer: Medicare Other | Admitting: Rehabilitation

## 2017-09-18 DIAGNOSIS — R2681 Unsteadiness on feet: Secondary | ICD-10-CM | POA: Diagnosis not present

## 2017-09-18 DIAGNOSIS — R2689 Other abnormalities of gait and mobility: Secondary | ICD-10-CM | POA: Diagnosis not present

## 2017-09-18 DIAGNOSIS — M6281 Muscle weakness (generalized): Secondary | ICD-10-CM | POA: Diagnosis not present

## 2017-09-19 NOTE — Therapy (Signed)
[facility] [address] [place], [place], [address] Phone: [phone]   Fax:  [phone]  Physical Therapy Treatment  Patient Details  Name: Julie Elliott MRN: [id number] Date of Birth: 1956/06/25 Referring Provider: Dr. Alysia Penna   Encounter Date: 09/18/2017  PT End of Session - 09/18/17 1543    Visit Number  10    Number of Visits  16    Date for PT Re-Evaluation  10/07/17    Authorization Type  Medicare/Medicaid    PT Start Time  1533    PT Stop Time  1615    PT Time Calculation (min)  42 min    Equipment Utilized During Treatment  Gait belt    Activity Tolerance  Patient tolerated treatment well;Patient limited by fatigue    Behavior During Therapy  [facility] for tasks assessed/performed       Past Medical History:  Diagnosis Date  . CHF (congestive heart failure) ([place])   . Chronic headaches   . DJD (degenerative joint disease)   . DM (diabetes mellitus) ([place]) 2008   stable HgBA1C at 6.5  . GERD (gastroesophageal reflux disease)    well controlled on Omeprazole  . HLD (hyperlipidemia) 2008   stable, well controlled  . Hypertension    well controlled    Past Surgical History:  Procedure Laterality Date  . carpel tunnel Right    20 yrs ago  . CERVICAL SPINE SURGERY  2016  . TUBAL LIGATION      There were no vitals filed for this visit.  Subjective Assessment - 09/18/17 1542    Subjective  Reports things are about the same, no falls.     Patient is accompained by:  Family member    Limitations  Walking    Patient Stated Goals  improve balance, negotiate stairs safely    Currently in Pain?  No/denies                      [facility] Adult PT Treatment/Exercise - 09/19/17 0001      Ambulation/Gait   Ambulation/Gait  Yes    Ambulation/Gait Assistance  5: Supervision    Ambulation/Gait Assistance Details  Performed gait with use of SBQC.  She requires cues for improved posture,  narrowed BOS and correct use of SBQC as she tends to place cane too far ahead and tipped onto single or double prongs.  Pt able to carryover with cues, however did not note carryover between tasks.      Ambulation Distance (Feet)  200 Feet another 100' during session     Assistive device  Small based quad cane    Gait Pattern  Decreased step length - right;Decreased stance time - left;Decreased hip/knee flexion - left;Decreased hip/knee flexion - right;Decreased dorsiflexion - right;Decreased dorsiflexion - left;Abducted - left;Poor foot clearance - left    Ambulation Surface  Level;Indoor    Stairs  Yes    Stairs Assistance  5: Supervision    Stairs Assistance Details (indicate cue type and reason)  Performed stair training for improved safety with home entry as well as BLE strengthening.  She performs stairs ascending with RLE in step to pattern except for last step she leads with LLE (stating this is how she does at home).  She descends stairs sideways with use of R rail.      Stair Management Technique  Two rails;Step to pattern;Sideways;Forwards    Number of Stairs  4    Height of  Stairs  6    Ramp  4: Min assist min/guard    Ramp Details (indicate cue type and reason)  Cues for safe sequencing with cane     Curb  4: Min assist;3: Mod assist    Curb Details (indicate cue type and reason)  Pt able to descend ramp with use of cane and min/mod A with heavy cues for safety and sequencing.  She was not willing to attempt to ascend curb step during session for fear of falling.        Neuro Re-ed    Neuro Re-ed Details   Supine hip/knee flex elevating LE from mat and lowering to floor and back to mat x 10 reps on each side.  Note increased fluidity with increased practice.       Exercises   Exercises  Other Exercises    Other Exercises   Step up/down in // bars with use of aerobic step forwards/backwards and side/side for improved LE strengthening and activation.  Cues for posture and safe UE  support as she tends to lean onto // bars.        Knee/Hip Exercises: Stretches   Hip Flexor Stretch  Both;1 rep;60 seconds supine with LE off edge of mat               PT Short Term Goals - 09/07/17 1537      PT SHORT TERM GOAL #1   Title  independent with initial HEP. (all STGs due 09/09/17)    Baseline  09/07/17: met today    Status  Achieved      PT SHORT TERM GOAL #2   Title  improve gait velocity to > 1.4 ft/sec for improved function    Baseline  09/07/17: increased to 1.19, improved just not to goal    Status  Partially Met      PT SHORT TERM GOAL #3   Title  perform BERG with STG and LTG to be written    Status  Achieved      PT SHORT TERM GOAL #4   Title  amb > 250' with LRAD with supervision for improved mobility and function    Baseline  09/07/17: max distance of 185 feet with small based quad cane before needing to rest    Status  Partially Met      PT SHORT TERM GOAL #5   Title  Pt will consistently perform sit <> stand from standard chair with mod I, normal BOS, and symmetrical LE placement.    Baseline  09/07/17: pt continues to need a wide base of support to stand    Status  Not Met      PT SHORT TERM GOAL #6   Title  improve BERG balance score to >/= 38/56 for improved balance    Baseline  09/07/17: 39/56 scored today    Status  Achieved        PT Long Term Goals - 08/26/17 1608      PT LONG TERM GOAL #1   Title  Pt will independently demo HEP to maximize functional gains made in PT.    Status  On-going    Target Date  10/07/17      PT LONG TERM GOAL #2   Title  improve gait velocity to > 1.8 ft/sec for decreased fall risk    Status  On-going    Target Date  10/07/17      PT LONG TERM GOAL #3   Title  improve  timed up and go to < 20 sec with LRAD for improved function and mobility    Status  On-going    Target Date  10/07/17      PT LONG TERM GOAL #4   Title  amb > 400' on indoor/paved outdoor surfaces with LRAD mod I for improved community  access    Status  On-going    Target Date  10/07/17      PT LONG TERM GOAL #5   Title  negotiate stairs with supervision for improved functional mobility    Status  On-going    Target Date  10/07/17      PT LONG TERM GOAL #6   Title  improve BERG balance score to >/= 45/56 for improved balance and decreased fall risk.    Status  New    Target Date  10/07/17            Plan - 09/18/17 1543    Clinical Impression Statement  Skilled session focused on gait for improved quality with use of SBQC, NMR for improved BLE strength and activation along with postural control, and stair/ramp/curb negotiation for safe technique and LE strength.      Rehab Potential  Fair    PT Frequency  2x / week    PT Duration  8 weeks    PT Treatment/Interventions  ADLs/Self Care Home Management;Cryotherapy;Electrical Stimulation;Moist Heat;Therapeutic exercise;Therapeutic activities;Functional mobility training;Stair training;Patient/family education;Gait training;DME Instruction;Ultrasound;Balance training;Neuromuscular re-education;Orthotic Fit/Training;Manual techniques;Taping    PT Next Visit Plan  continue to work on gait quad cane (per pt's choice), continue to work on strengthening and standing balance activities    Consulted and Agree with Plan of Care  Patient       Patient will benefit from skilled therapeutic intervention in order to improve the following deficits and impairments:  Abnormal gait, Pain, Increased muscle spasms, Increased fascial restricitons, Decreased strength, Difficulty walking, Decreased balance, Impaired flexibility, Decreased range of motion, Hypomobility, Decreased knowledge of use of DME, Decreased mobility  Visit Diagnosis: Other abnormalities of gait and mobility  Unsteadiness on feet  Muscle weakness (generalized)     Problem List Patient Active Problem List   Diagnosis Date Noted  . Breast cancer screening by mammogram 04/28/2017  . Spastic quadriparesis  ([place]) 11/02/2015  . Tobacco abuse 08/16/2015  . Peripheral neuropathy 07/17/2015  . Cervical cord compression with myelopathy ([place]) 02/08/2015  . Frozen shoulder--left 09/05/2014  . Unintentional weight loss 06/20/2014  . Health care maintenance 03/23/2012  . Essential hypertension 08/17/2006  . GERD 08/17/2006  . History of diabetes mellitus, type II 08/11/2006  . Hyperlipidemia 08/11/2006  . Osteoarthritis 08/11/2006    Cameron Sprang, PT, MPT [facility] [address] [place], [place], [address] Phone: [phone]   Fax:  [phone] 09/19/17, 7:35 AM  Name: Julie Elliott MRN: [id number] Date of Birth: 1956-06-05

## 2017-09-21 ENCOUNTER — Inpatient Hospital Stay: Admit: 2017-09-21 | Payer: PRIVATE HEALTH INSURANCE | Attending: Family Medicine | Primary: Registered Nurse

## 2017-09-21 ENCOUNTER — Encounter: Payer: Self-pay | Admitting: Rehabilitation

## 2017-09-21 ENCOUNTER — Ambulatory Visit: Payer: Medicare Other | Admitting: Rehabilitation

## 2017-09-21 DIAGNOSIS — R2689 Other abnormalities of gait and mobility: Secondary | ICD-10-CM

## 2017-09-21 DIAGNOSIS — R2681 Unsteadiness on feet: Secondary | ICD-10-CM

## 2017-09-21 DIAGNOSIS — M6281 Muscle weakness (generalized): Secondary | ICD-10-CM | POA: Diagnosis not present

## 2017-09-21 DIAGNOSIS — Z1231 Encounter for screening mammogram for malignant neoplasm of breast: Secondary | ICD-10-CM

## 2017-09-21 NOTE — Patient Instructions (Signed)
Hamstring Stretch, Seated (Strap, Two Chairs)    Sit with one leg extended onto facing chair.  1. Try to get knee as straight as possible (toes and knee straight - not pointing inward/outward); then hinged forward at waist to feel a stretch in the back of your leg. Hold for _30-60__ seconds, __2-3__ times per day on each leg.  2. Loop strap/belt over outstretched foot (as pictured). Keeping foot aligned straight, pull belt to feel gentle stretch in back of lower leg. Hold for 60 seconds, 2-3 times per day on each leg.  Hip Flexor Stretch    Lying on back near edge of (low) couch, bend one leg, foot flat. Hang leg over edge with knee bent and thigh resting on bed. Hold for 30-60 seconds, 2-3 times per day on each leg.    Functional Quadriceps: Sit to Stand    Sit on edge of chair, feet flat on floor. Scoot to edge of chair/bed. Be sure something stable is in front of you (in case you lose your balance forward). Try to stand up without using your hands. Repeat __10__ times per set. Do __2__ sets per day.  Feet Together (Compliant Surface) Head Motion - Eyes Closed    Stand with your back to a corner with a stable chair in front of you. Stand on 1 pillowwithfeet together. Close eyes and move head slowly: up and down 10 times; right to left 10 times. Repeat _2___ times per day. Single Leg Stance    Stand with your back to a corner with a stable chair in front of you. Raise one leg. Hold _10__ seconds. Repeat with other leg. Try this 8-10 times per day on each leg.  Abduction: Clam (Eccentric) - Side-Lying    Lie on side with knees bent. Lift top knee, keeping feet together. Keep trunk steady. Slowly lower for 3-5 seconds. __10_ reps per set, _1__ sets per day, _5-7__ days per week.   http://ecce.exer.us/65   Copyright  VHI. All rights reserved.

## 2017-09-21 NOTE — Therapy (Signed)
[facility] [address] [place], [place], [address] Phone: [phone]   Fax:  [phone]  Physical Therapy Treatment  Patient Details  Name: Julie Elliott MRN: [id number] Date of Birth: [birth year] Referring Provider: Dr. Alysia Penna   Encounter Date: 09/21/2017  PT End of Session - 09/21/17 1538    Visit Number  11    Number of Visits  16    Date for PT Re-Evaluation  10/07/17    Authorization Type  Medicare/Medicaid    PT Start Time  1533    PT Stop Time  1615    PT Time Calculation (min)  42 min    Equipment Utilized During Treatment  Gait belt    Activity Tolerance  Patient tolerated treatment well;Patient limited by fatigue    Behavior During Therapy  [facility] for tasks assessed/performed       Past Medical History:  Diagnosis Date  . CHF (congestive heart failure) ([place])   . Chronic headaches   . DJD (degenerative joint disease)   . DM (diabetes mellitus) ([place]) 2008   stable HgBA1C at 6.5  . GERD (gastroesophageal reflux disease)    well controlled on Omeprazole  . HLD (hyperlipidemia) 2008   stable, well controlled  . Hypertension    well controlled    Past Surgical History:  Procedure Laterality Date  . carpel tunnel Right    20 yrs ago  . CERVICAL SPINE SURGERY  2016  . TUBAL LIGATION      There were no vitals filed for this visit.  Subjective Assessment - 09/21/17 1537    Subjective  Pt reports doing exercises over the weekend, but no much else.     Patient is accompained by:  Family member    Limitations  Walking    Patient Stated Goals  improve balance, negotiate stairs safely    Currently in Pain?  No/denies                      [facility] Adult PT Treatment/Exercise - 09/21/17 0001      Ambulation/Gait   Stairs  Yes    Stairs Assistance  5: Supervision    Stairs Assistance Details (indicate cue type and reason)  Performed stairs for BLE strengthening during session as  well as improved functional endurance.  Pt not willing to perform more than one time for fear that she would not have enough energy to do at home.  Educated on importance of performing multiple times to improved strength and endurance.      Stair Management Technique  Two rails;Step to pattern;Sideways;Forwards    Number of Stairs  4    Height of Stairs  6       Therex and NMR:  Went over current HEP (except for seated hamstring stretch) with education and demonstration on how to perform hip flex stretch with use of sheet to increase stretch.        PT Education - 09/21/17 2111    Education provided  Yes    Education Details  importance of compliance with HEP, cues in therapy for safe ambulation    Person(s) Educated  Patient    Methods  Explanation    Comprehension  Verbalized understanding       PT Short Term Goals - 09/07/17 1537      PT SHORT TERM GOAL #1   Title  independent with initial HEP. (all STGs due 09/09/17)    Baseline  09/07/17: met  today    Status  Achieved      PT SHORT TERM GOAL #2   Title  improve gait velocity to > 1.4 ft/sec for improved function    Baseline  09/07/17: increased to 1.19, improved just not to goal    Status  Partially Met      PT SHORT TERM GOAL #3   Title  perform BERG with STG and LTG to be written    Status  Achieved      PT SHORT TERM GOAL #4   Title  amb > 250' with LRAD with supervision for improved mobility and function    Baseline  09/07/17: max distance of 185 feet with small based quad cane before needing to rest    Status  Partially Met      PT SHORT TERM GOAL #5   Title  Pt will consistently perform sit <> stand from standard chair with mod I, normal BOS, and symmetrical LE placement.    Baseline  09/07/17: pt continues to need a wide base of support to stand    Status  Not Met      PT SHORT TERM GOAL #6   Title  improve BERG balance score to >/= 38/56 for improved balance    Baseline  09/07/17: 39/56 scored today    Status   Achieved        PT Long Term Goals - 08/26/17 1608      PT LONG TERM GOAL #1   Title  Pt will independently demo HEP to maximize functional gains made in PT.    Status  On-going    Target Date  10/07/17      PT LONG TERM GOAL #2   Title  improve gait velocity to > 1.8 ft/sec for decreased fall risk    Status  On-going    Target Date  10/07/17      PT LONG TERM GOAL #3   Title  improve timed up and go to < 20 sec with LRAD for improved function and mobility    Status  On-going    Target Date  10/07/17      PT LONG TERM GOAL #4   Title  amb > 400' on indoor/paved outdoor surfaces with LRAD mod I for improved community access    Status  On-going    Target Date  10/07/17      PT LONG TERM GOAL #5   Title  negotiate stairs with supervision for improved functional mobility    Status  On-going    Target Date  10/07/17      PT LONG TERM GOAL #6   Title  improve BERG balance score to >/= 45/56 for improved balance and decreased fall risk.    Status  New    Target Date  10/07/17            Plan - 09/21/17 2113    Clinical Impression Statement  Skilled session went over current HEP with education on how to increase hip flex stretch with use of sheet at home.  Also performed stairs for increased BLE strength/endurance.   Pt reluctant to challenge self in therapy and does not demonstrate carryover.      Rehab Potential  Fair    PT Frequency  2x / week    PT Duration  8 weeks    PT Treatment/Interventions  ADLs/Self Care Home Management;Cryotherapy;Electrical Stimulation;Moist Heat;Therapeutic exercise;Therapeutic activities;Functional mobility training;Stair training;Patient/family education;Gait training;DME Instruction;Ultrasound;Balance training;Neuromuscular re-education;Orthotic Fit/Training;Manual techniques;Taping  PT Next Visit Plan  D/C soon?  She does not seem very motivated to continue with therapy, continue to work on gait quad cane (per pt's choice), continue to  work on strengthening and standing balance activities    Consulted and Agree with Plan of Care  Patient       Patient will benefit from skilled therapeutic intervention in order to improve the following deficits and impairments:  Abnormal gait, Pain, Increased muscle spasms, Increased fascial restricitons, Decreased strength, Difficulty walking, Decreased balance, Impaired flexibility, Decreased range of motion, Hypomobility, Decreased knowledge of use of DME, Decreased mobility  Visit Diagnosis: Other abnormalities of gait and mobility  Unsteadiness on feet  Muscle weakness (generalized)     Problem List Patient Active Problem List   Diagnosis Date Noted  . Breast cancer screening by mammogram 04/28/2017  . 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 Tobacco abuse 08/16/2015  . Peripheral neuropathy 07/17/2015  . Cervical cord compression with myelopathy ([place]) 02/08/2015  . Frozen shoulder--left 09/05/2014  . Unintentional weight loss 06/20/2014  . Health care maintenance 03/23/2012  . Essential hypertension 08/17/2006  . GERD 08/17/2006  . History of diabetes mellitus, type II 08/11/2006  . Hyperlipidemia 08/11/2006  . Osteoarthritis 08/11/2006    Cameron Sprang, PT, MPT [facility] [address] [place], [place], [address] Phone: [phone]   Fax:  [phone] 09/21/17, 9:17 PM  Name: Julie Elliott MRN: [id number] Date of Birth: February 26, 1956

## 2017-09-23 ENCOUNTER — Ambulatory Visit (INDEPENDENT_AMBULATORY_CARE_PROVIDER_SITE_OTHER): Payer: Medicare Other | Admitting: Podiatry

## 2017-09-23 DIAGNOSIS — B351 Tinea unguium: Secondary | ICD-10-CM | POA: Diagnosis not present

## 2017-09-23 DIAGNOSIS — M79672 Pain in left foot: Secondary | ICD-10-CM

## 2017-09-23 DIAGNOSIS — M79671 Pain in right foot: Secondary | ICD-10-CM

## 2017-09-23 NOTE — Patient Instructions (Signed)
Seen for hypertrophic nails. All nails debrided. May benefit from using firm pillow under leg while laying down to take pressure off of heels. Use wheeled walker and increase mobility. Return in 3 months or as needed.

## 2017-09-23 NOTE — Progress Notes (Signed)
Subjective: 62 y.o. year old female patient presents complaining of painful nails. Feet are tight and hurts to walk. Also pain in back of heel while in bed.  Positive history of bulging disc in neck and causing problem in shoulder and hand on left side allowing limited mobility.  Had disc surgery in October 2016. CHF in 1994, controlled with Digoxin and Vasotec.   Objective: Dermatologic: Thick yellow deformed nails both great toes. Thick distal 1/2 of nail plate grows dorsally and proximally. Vascular: Pedal pulses are all palpable. Bilateral lower limb edema. Orthopedic: Severe stiffness foot and ankle joint. Neurologic: Hyperalgic foot bilateral.  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 Peripheral neuropathy. Bilateral lower limb edema.  Treatment: All mycotic nails debrided.  Advised to keep a pad or pillow under the leg to take pressure off of her heel while laying down. Explained both to patient and her son. Return in 3 months or as needed.

## 2017-09-24 ENCOUNTER — Encounter: Payer: Self-pay | Admitting: Podiatry

## 2017-09-25 ENCOUNTER — Ambulatory Visit: Payer: Medicare Other | Admitting: Physical Therapy

## 2017-09-25 ENCOUNTER — Encounter: Payer: Self-pay | Admitting: Physical Therapy

## 2017-09-25 DIAGNOSIS — M6281 Muscle weakness (generalized): Secondary | ICD-10-CM | POA: Diagnosis not present

## 2017-09-25 DIAGNOSIS — R2681 Unsteadiness on feet: Secondary | ICD-10-CM | POA: Diagnosis not present

## 2017-09-25 DIAGNOSIS — R2689 Other abnormalities of gait and mobility: Secondary | ICD-10-CM

## 2017-09-25 NOTE — Therapy (Signed)
[facility] [address] [place], [place], [address] Phone: [phone]   Fax:  [phone]  Physical Therapy Treatment  Patient Details  Name: Julie Elliott MRN: [id number] Date of Birth: 29-Aug-1955 Referring Provider: Dr. Alysia Penna   Encounter Date: 09/25/2017  PT End of Session - 09/25/17 1631    Visit Number  12    Number of Visits  16    Date for PT Re-Evaluation  10/07/17    Authorization Type  Medicare/Medicaid    PT Start Time  1531    PT Stop Time  1618    PT Time Calculation (min)  47 min    Equipment Utilized During Treatment  Gait belt    Activity Tolerance  Patient tolerated treatment well;Patient limited by fatigue    Behavior During Therapy  [facility] for tasks assessed/performed       Past Medical History:  Diagnosis Date  . CHF (congestive heart failure) ([place])   . Chronic headaches   . DJD (degenerative joint disease)   . DM (diabetes mellitus) ([place]) 2008   stable HgBA1C at 6.5  . GERD (gastroesophageal reflux disease)    well controlled on Omeprazole  . HLD (hyperlipidemia) 2008   stable, well controlled  . Hypertension    well controlled    Past Surgical History:  Procedure Laterality Date  . carpel tunnel Right    20 yrs ago  . CERVICAL SPINE SURGERY  2016  . TUBAL LIGATION      There were no vitals filed for this visit.  Subjective Assessment - 09/25/17 1534    Subjective  Pt reported no changes in medication and no falls. However, Pt stated the rainy weather affects her knees, "they give out, my shin bone and my ankles are swollen." Pt also stated she went to her foot doctor, and mentioned they recommended a walker with wheels.     Patient is accompained by:  Family member    Limitations  Walking    Patient Stated Goals  improve balance, negotiate stairs safely    Currently in Pain?  Other (Comment) Feels like her knees buckle, shin bone and ankle swelling    Pain Score  0-No  pain    Multiple Pain Sites  No         [facility] Adult PT Treatment/Exercise - 09/25/17 1632      Transfers   Transfers  Sit to Stand;Stand to Sit    Sit to Stand  4: Min assist    Sit to Stand Details  Verbal cues for technique    Sit to Stand Details (indicate cue type and reason)  Pt has wide stance and hinges forward before standing    Number of Reps  Other reps (comment) During transfer and rest breaks x 7 reps      Ambulation/Gait   Ambulation/Gait  Yes    Ambulation/Gait Assistance  5: Supervision Use of gait belt, verbal cues for upright posture    Ambulation/Gait Assistance Details  Gait with SPC with quad tip 60 feet, 30 feet to parallel bars,with rollator 150 feet and 80 feet.     Ambulation Distance (Feet)  150 Feet Cane: 60 feet arrival, 30 to parallel bars; rollator150 , 80    Assistive device  Small based quad cane;Rollator    Gait Pattern  Decreased step length - right;Decreased stance time - left;Decreased hip/knee flexion - left;Decreased hip/knee flexion - right;Decreased dorsiflexion - right;Decreased dorsiflexion - left;Abducted - left;Poor foot clearance -  left    Ambulation Surface  Other (comment);Level;Unlevel;Indoor;Paved Inside PT clinic, and exited outdoors to help pt to car    Stairs  No Used single step at parallel bars today     Stairs Assistance  5: Supervision    Stairs Assistance Details (indicate cue type and reason)  Pt required demonstration, verbal, UE assist to perform step up, onto, and down step.     Stair Management Technique  Two rails;Step to pattern;Sideways;Forwards;One rail Left;One rail Right;With cane;Other (comment) Practice with both parallel bar UE hold, single bar, cane    Number of Stairs  1    Height of Stairs  1      Exercises   Other Exercises   Standing on airex pad with Alt UE shd flexion 2 x 10 reps; shd diagonal reaching 2 x 10 reps      Ambulation   Ambulation/Gait Assistance Details  Verbal cues for  precautions/safety;Visual cues for safe use of DME/AE;Visual cues/gestures for precautions/safety;Visual cues/gestures for sequencing;Verbal cues for technique    Stairs Assistance Details  Verbal cues for sequencing;Verbal cues for technique;Verbal cues for precautions/safety;Verbal cues for gait pattern          Balance Exercises - 09/25/17 1646      Balance Exercises: Standing   Standing Eyes Opened  30 secs;2 reps    Standing Eyes Closed  30 secs;2 reps Open stance and Narrow BOS    SLS with Vectors  Solid surface;Other (comment);5 reps 5 reps x 2 sets B, heel taps, toe taps, step tap fwd    Overall Comments  Limitations Swaying during EC tasks, lateral swaying during SLS tasks        PT Education - 09/25/17 1629    Education provided  Yes    Education Details  Rollator AD usage    Person(s) Educated  Patient;Other (comment) Family member present    Methods  Explanation;Demonstration;Verbal cues;Other (comment) Practice task     Comprehension  Verbalized understanding;Returned demonstration;Verbal cues required;Tactile cues required       PT Short Term Goals - 09/07/17 1537      PT SHORT TERM GOAL #1   Title  independent with initial HEP. (all STGs due 09/09/17)    Baseline  09/07/17: met today    Status  Achieved      PT SHORT TERM GOAL #2   Title  improve gait velocity to > 1.4 ft/sec for improved function    Baseline  09/07/17: increased to 1.19, improved just not to goal    Status  Partially Met      PT SHORT TERM GOAL #3   Title  perform BERG with STG and LTG to be written    Status  Achieved      PT SHORT TERM GOAL #4   Title  amb > 250' with LRAD with supervision for improved mobility and function    Baseline  09/07/17: max distance of 185 feet with small based quad cane before needing to rest    Status  Partially Met      PT SHORT TERM GOAL #5   Title  Pt will consistently perform sit <> stand from standard chair with mod I, normal BOS, and symmetrical LE  placement.    Baseline  09/07/17: pt continues to need a wide base of support to stand    Status  Not Met      PT SHORT TERM GOAL #6   Title  improve BERG balance score to >/= 38/56  for improved balance    Baseline  09/07/17: 39/56 scored today    Status  Achieved        PT Long Term Goals - 08/26/17 1608      PT LONG TERM GOAL #1   Title  Pt will independently demo HEP to maximize functional gains made in PT.    Status  On-going    Target Date  10/07/17      PT LONG TERM GOAL #2   Title  improve gait velocity to > 1.8 ft/sec for decreased fall risk    Status  On-going    Target Date  10/07/17      PT LONG TERM GOAL #3   Title  improve timed up and go to < 20 sec with LRAD for improved function and mobility    Status  On-going    Target Date  10/07/17      PT LONG TERM GOAL #4   Title  amb > 400' on indoor/paved outdoor surfaces with LRAD mod I for improved community access    Status  On-going    Target Date  10/07/17      PT LONG TERM GOAL #5   Title  negotiate stairs with supervision for improved functional mobility    Status  On-going    Target Date  10/07/17      PT LONG TERM GOAL #6   Title  improve BERG balance score to >/= 45/56 for improved balance and decreased fall risk.    Status  New    Target Date  10/07/17        Plan - 09/25/17 1653    Clinical Impression Statement  Today's session focused on balance training, LE strengthening, and AD ambulation training using rollator. Pt arrived with cane with quad tip to clinic but was open to practicing ambulation around clinic with use of rollator for the first time. During AD ambulation with rollator, pt upright posture improved, gait steadiness, and improved gait quality. Pt and familiy member demonstrated understanding of safety consideration using a rollator, use of brakes, and optimal hand placement on handle of rollator. During step up task, pt quality of performed task decrease with less UE support of the parallel  bar or cane. Rest breaks were required between tasks. However, gait distance increased due to use of rollator. Pt goal towards improved gait speed improving.     History and Personal Factors relevant to plan of care:  3 comorbidities, functional decline since d/c from PT in 2017    Clinical Presentation  Evolving    Clinical Presentation due to:  functional decline, fall risk     Clinical Decision Making  Moderate    Rehab Potential  Fair    PT Frequency  2x / week    PT Duration  8 weeks    PT Treatment/Interventions  ADLs/Self Care Home Management;Cryotherapy;Electrical Stimulation;Moist Heat;Therapeutic exercise;Therapeutic activities;Functional mobility training;Stair training;Patient/family education;Gait training;DME Instruction;Ultrasound;Balance training;Neuromuscular re-education;Orthotic Fit/Training;Manual techniques;Taping    PT Next Visit Plan  Follow up on use of rollator. Family member planned on buying rollator today and was given imformation on options for purchasing device. Next session with primar PT will be considering possible D/C as previous PT recommended possible discharge early due to pt not very motivated for therapy. Pt did progress this session with education on use of rollator. Defer this to primary PT.      Consulted and Agree with Plan of Care  Patient  Patient will benefit from skilled therapeutic intervention in order to improve the following deficits and impairments:  Abnormal gait, Pain, Increased muscle spasms, Increased fascial restricitons, Decreased strength, Difficulty walking, Decreased balance, Impaired flexibility, Decreased range of motion, Hypomobility, Decreased knowledge of use of DME, Decreased mobility  Visit Diagnosis: Other abnormalities of gait and mobility  Unsteadiness on feet  Muscle weakness (generalized)     Problem List Patient Active Problem List   Diagnosis Date Noted  . Breast cancer screening by mammogram  04/28/2017  . 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 Tobacco abuse 08/16/2015  . Peripheral neuropathy 07/17/2015  . Cervical cord compression with myelopathy ([place]) 02/08/2015  . Frozen shoulder--left 09/05/2014  . Unintentional weight loss 06/20/2014  . Health care maintenance 03/23/2012  . Essential hypertension 08/17/2006  . GERD 08/17/2006  . History of diabetes mellitus, type II 08/11/2006  . Hyperlipidemia 08/11/2006  . Osteoarthritis 08/11/2006    Carlena Sax 09/25/2017, 5:06 PM  [facility] [address] [place], [place], [address] Phone: [phone]   Fax:  [phone]  Name: Julie Elliott MRN: [id number] Date of Birth: 1956/06/06  This note has been reviewed and edited by supervising CI.  Willow Ora, PTA, [facility] [address], [place], [place] [address] [phone] 09/25/17, 11:28 PM

## 2017-09-28 ENCOUNTER — Ambulatory Visit: Payer: Medicare Other | Admitting: Physical Therapy

## 2017-09-28 ENCOUNTER — Encounter: Payer: Self-pay | Admitting: Physical Therapy

## 2017-09-28 DIAGNOSIS — M6281 Muscle weakness (generalized): Secondary | ICD-10-CM

## 2017-09-28 DIAGNOSIS — R2681 Unsteadiness on feet: Secondary | ICD-10-CM | POA: Diagnosis not present

## 2017-09-28 DIAGNOSIS — R2689 Other abnormalities of gait and mobility: Secondary | ICD-10-CM | POA: Diagnosis not present

## 2017-09-28 NOTE — Therapy (Signed)
[facility] [address] [place], [place], [address] Phone: [phone]   Fax:  [phone]  Physical Therapy Treatment  Patient Details  Name: Julie Elliott MRN: [id number] Date of Birth: [birth year] Referring Provider: Dr. Alysia Penna   Encounter Date: 09/28/2017  PT End of Session - 09/28/17 1615    Visit Number  13    Number of Visits  16    Date for PT Re-Evaluation  10/07/17    Authorization Type  Medicare/Medicaid    PT Start Time  1532    PT Stop Time  1612    PT Time Calculation (min)  40 min    Equipment Utilized During Treatment  Gait belt    Activity Tolerance  Patient tolerated treatment well;Patient limited by fatigue    Behavior During Therapy  [facility] for tasks assessed/performed       Past Medical History:  Diagnosis Date  . CHF (congestive heart failure) ([place])   . Chronic headaches   . DJD (degenerative joint disease)   . DM (diabetes mellitus) ([place]) 2008   stable HgBA1C at 6.5  . GERD (gastroesophageal reflux disease)    well controlled on Omeprazole  . HLD (hyperlipidemia) 2008   stable, well controlled  . Hypertension    well controlled    Past Surgical History:  Procedure Laterality Date  . carpel tunnel Right    20 yrs ago  . CERVICAL SPINE SURGERY  2016  . TUBAL LIGATION      There were no vitals filed for this visit.  Subjective Assessment - 09/28/17 1535    Subjective  son needed to help pt stand up in lobby.    Patient Stated Goals  improve balance, negotiate stairs safely    Currently in Pain?  No/denies                      [facility] Adult PT Treatment/Exercise - 09/28/17 1553      Ambulation/Gait   Ambulation/Gait Assistance  5: Supervision    Ambulation Distance (Feet)  250 Feet    Assistive device  Rollator    Gait Pattern  Decreased step length - right;Decreased stance time - left;Decreased hip/knee flexion - left;Decreased hip/knee flexion -  right;Decreased dorsiflexion - right;Decreased dorsiflexion - left;Abducted - left;Poor foot clearance - left    Ambulation Surface  Level;Indoor;Outdoor;Paved    Stairs Assistance Details (indicate cue type and reason)  attempted; pt unable to step up today    Gait Comments  HR up to 134 bpm after 250' amb; needing seated rest break to recover      Self-Care   Other Self-Care Comments   extensive discussion about current POC, and PT recommendations.  Recommend d/c at end of POC (2 visits remaining) and pt to continue with home program and regular walking.  Pt with limited motivation at this time and pts son reports he will be moving back in with pt and will help with continued exercise.      Knee/Hip Exercises: Standing   Forward Step Up  Limitations    Forward Step Up Limitations  attempted today but pt unable c/o Rt knee pain and instability as well as increased fatigue    Other Standing Knee Exercises  taps to 6" step with 2UE support x 10 bil             PT Education - 09/28/17 1615    Education provided  Yes    Education Details  see self care    Person(s) Educated  Patient;Child(ren)    Methods  Explanation    Comprehension  Verbalized understanding       PT Short Term Goals - 09/07/17 1537      PT SHORT TERM GOAL #1   Title  independent with initial HEP. (all STGs due 09/09/17)    Baseline  09/07/17: met today    Status  Achieved      PT SHORT TERM GOAL #2   Title  improve gait velocity to > 1.4 ft/sec for improved function    Baseline  09/07/17: increased to 1.19, improved just not to goal    Status  Partially Met      PT SHORT TERM GOAL #3   Title  perform BERG with STG and LTG to be written    Status  Achieved      PT SHORT TERM GOAL #4   Title  amb > 250' with LRAD with supervision for improved mobility and function    Baseline  09/07/17: max distance of 185 feet with small based quad cane before needing to rest    Status  Partially Met      PT SHORT TERM GOAL  #5   Title  Pt will consistently perform sit <> stand from standard chair with mod I, normal BOS, and symmetrical LE placement.    Baseline  09/07/17: pt continues to need a wide base of support to stand    Status  Not Met      PT SHORT TERM GOAL #6   Title  improve BERG balance score to >/= 38/56 for improved balance    Baseline  09/07/17: 39/56 scored today    Status  Achieved        PT Long Term Goals - 08/26/17 1608      PT LONG TERM GOAL #1   Title  Pt will independently demo HEP to maximize functional gains made in PT.    Status  On-going    Target Date  10/07/17      PT LONG TERM GOAL #2   Title  improve gait velocity to > 1.8 ft/sec for decreased fall risk    Status  On-going    Target Date  10/07/17      PT LONG TERM GOAL #3   Title  improve timed up and go to < 20 sec with LRAD for improved function and mobility    Status  On-going    Target Date  10/07/17      PT LONG TERM GOAL #4   Title  amb > 400' on indoor/paved outdoor surfaces with LRAD mod I for improved community access    Status  On-going    Target Date  10/07/17      PT LONG TERM GOAL #5   Title  negotiate stairs with supervision for improved functional mobility    Status  On-going    Target Date  10/07/17      PT LONG TERM GOAL #6   Title  improve BERG balance score to >/= 45/56 for improved balance and decreased fall risk.    Status  New    Target Date  10/07/17            Plan - 09/28/17 1616    Clinical Impression Statement  Pt with poor activity tolerance today with elevated HR with ambulation today.  Pt seems to lack motivation at this time, and plan to d/c at end of POC.  PT Treatment/Interventions  ADLs/Self Care Home Management;Cryotherapy;Electrical Stimulation;Moist Heat;Therapeutic exercise;Therapeutic activities;Functional mobility training;Stair training;Patient/family education;Gait training;DME Instruction;Ultrasound;Balance training;Neuromuscular re-education;Orthotic  Fit/Training;Manual techniques;Taping    PT Next Visit Plan  gait with rollator; plan for d/c next 2 visits    Consulted and Agree with Plan of Care  Patient       Patient will benefit from skilled therapeutic intervention in order to improve the following deficits and impairments:  Abnormal gait, Pain, Increased muscle spasms, Increased fascial restricitons, Decreased strength, Difficulty walking, Decreased balance, Impaired flexibility, Decreased range of motion, Hypomobility, Decreased knowledge of use of DME, Decreased mobility  Visit Diagnosis: Other abnormalities of gait and mobility  Unsteadiness on feet  Muscle weakness (generalized)     Problem List Patient Active Problem List   Diagnosis Date Noted  . Breast cancer screening by mammogram 04/28/2017  . 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 Tobacco abuse 08/16/2015  . Peripheral neuropathy 07/17/2015  . Cervical cord compression with myelopathy ([place]) 02/08/2015  . Frozen shoulder--left 09/05/2014  . Unintentional weight loss 06/20/2014  . Health care maintenance 03/23/2012  . Essential hypertension 08/17/2006  . GERD 08/17/2006  . History of diabetes mellitus, type II 08/11/2006  . Hyperlipidemia 08/11/2006  . Osteoarthritis 08/11/2006      Laureen Abrahams, PT, DPT 09/28/17 4:17 PM    [facility] [address] [place], [place], [address] Phone: [phone]   Fax:  [phone]  Name: Julie Elliott MRN: [id number] Date of Birth: [birth year]

## 2017-09-30 ENCOUNTER — Ambulatory Visit: Payer: Medicare Other | Admitting: Physical Therapy

## 2017-09-30 ENCOUNTER — Encounter: Payer: Self-pay | Admitting: Physical Therapy

## 2017-09-30 DIAGNOSIS — R2689 Other abnormalities of gait and mobility: Secondary | ICD-10-CM

## 2017-09-30 DIAGNOSIS — M6281 Muscle weakness (generalized): Secondary | ICD-10-CM

## 2017-09-30 DIAGNOSIS — R2681 Unsteadiness on feet: Secondary | ICD-10-CM | POA: Diagnosis not present

## 2017-09-30 NOTE — Therapy (Signed)
[facility] [address] [place], [place], [address] Phone: [phone]   Fax:  [phone]  Physical Therapy Treatment  Patient Details  Name: Julie Elliott MRN: [id number] Date of Birth: 1955-09-02 Referring Provider: Dr. Alysia Penna   Encounter Date: 09/30/2017  PT End of Session - 09/30/17 1619    Visit Number  14    Number of Visits  16    Date for PT Re-Evaluation  10/07/17    Authorization Type  Medicare/Medicaid    PT Start Time  1449    PT Stop Time  1530    PT Time Calculation (min)  41 min    Equipment Utilized During Treatment  Gait belt    Activity Tolerance  Patient tolerated treatment well;Patient limited by fatigue    Behavior During Therapy  [facility] for tasks assessed/performed       Past Medical History:  Diagnosis Date  . CHF (congestive heart failure) ([place])   . Chronic headaches   . DJD (degenerative joint disease)   . DM (diabetes mellitus) ([place]) 2008   stable HgBA1C at 6.5  . GERD (gastroesophageal reflux disease)    well controlled on Omeprazole  . HLD (hyperlipidemia) 2008   stable, well controlled  . Hypertension    well controlled    Past Surgical History:  Procedure Laterality Date  . carpel tunnel Right    20 yrs ago  . CERVICAL SPINE SURGERY  2016  . TUBAL LIGATION      There were no vitals filed for this visit.  Subjective Assessment - 09/30/17 1451    Subjective  No pain, no changes in medications, and no falls reported.     Patient is accompained by:  Family member    Limitations  Walking    Patient Stated Goals  improve balance, negotiate stairs safely    Currently in Pain?  No/denies    Pain Score  0-No pain    Multiple Pain Sites  No         [facility] Adult PT Treatment/Exercise - 09/30/17 1534      Transfers   Transfers  Sit to Stand;Stand to Sit    Sit to Stand  4: Min assist    Sit to Stand Details  Verbal cues for technique    Sit to Stand Details  (indicate cue type and reason)  Wide BOS and increased forward trunk flexion needed for STS.  Verbal cues for eccentric lowering required, pt tendency to quickly sit without eccentic lowering control.    Number of Reps  Other reps (comment) x 6 times during transitions in PT clinic.       Ambulation/Gait   Ambulation/Gait  Yes    Ambulation/Gait Assistance  5: Supervision    Ambulation Distance (Feet)  230 Feet 250 with rollator & 40 feet x2, 60 feet x 2 with quad cane     Assistive device  Rollator;Small based quad cane    Gait Pattern  Decreased step length - right;Decreased stance time - left;Decreased hip/knee flexion - left;Decreased hip/knee flexion - right;Decreased dorsiflexion - right;Decreased dorsiflexion - left;Abducted - left;Poor foot clearance - left    Ambulation Surface  Level;Indoor    Stairs  Yes    Stairs Assistance  4: Min assist;5: Supervision    Stairs Assistance Details (indicate cue type and reason)  Pt performed stair training forward ascending and sideways descending using B UE support for improved home safety and BLE strengthening. Pt performed ascending with  both RLE and LLE in step to pattern, LLE was easier for patient today (pt stated she goes up both at home, depending on which feels stronger.)      Stair Management Technique  Two rails;Step to pattern;Sideways;Forwards;One rail Left;One rail Right;With cane;Other (comment)    Number of Stairs  4    Height of Stairs  6    Gait Comments  Pt required rest break after 230 feet ambulation with rollator. Pt upright posture and gait distance tolerance increases with use of rollator in comparison to quad cane. Pt and family member mentioned rollator was being reshipped currently due to shipping damage.       High Level Balance   High Level Balance Activities  Other (comment) Standing Narrow BOS with EO, solid surface and on airex pad     High Level Balance Comments  30 sec x 2 reps pt with minor swaying standing on solid  surface. But standing on airex pad, loss balance two which required a steppage strategy to regain upright posture.      Knee/Hip Exercises: Seated   Long Arc Quad  2 sets;10 reps;Strengthening;Weights;Other (comment);Limitations;AROM;Both #2 cuff weights used, limited AROM     Clamshell with TheraBand  Green 2 x 10 reps with 5 sec holds.    Marching  Strengthening;Weights;Other (comment);Limitations;15 reps;AROM;Both #2 cuff weights, limited AROM during task.     Marching Limitations  Pt only able to perform limited AROM of hip flexion to perform march and unable to slowly eccentric lower.     Hamstring Curl  Strengthening;10 reps;AROM;Limitations;Both Using Green tband, B LE, with 5 sec holds at end range.       Ambulation   Ambulation/Gait Assistance Details  Verbal cues for precautions/safety;Verbal cues for technique;Verbal cues for safe use of DME/AE;Verbal cues for gait pattern    Stairs Assistance Details  Verbal cues for sequencing;Verbal cues for technique;Verbal cues for precautions/safety;Verbal cues for gait pattern        Balance Exercises - 09/30/17 1601      Balance Exercises: Standing   Standing Eyes Closed  30 secs;2 reps one rep on solid surface, second rep on airex pad    Tandem Stance  Eyes closed;2 reps;30 secs first rep on solid surface, second rep on airex pad         PT Short Term Goals - 09/07/17 1537      PT SHORT TERM GOAL #1   Title  independent with initial HEP. (all STGs due 09/09/17)    Baseline  09/07/17: met today    Status  Achieved      PT SHORT TERM GOAL #2   Title  improve gait velocity to > 1.4 ft/sec for improved function    Baseline  09/07/17: increased to 1.19, improved just not to goal    Status  Partially Met      PT SHORT TERM GOAL #3   Title  perform BERG with STG and LTG to be written    Status  Achieved      PT SHORT TERM GOAL #4   Title  amb > 250' with LRAD with supervision for improved mobility and function    Baseline  09/07/17:  max distance of 185 feet with small based quad cane before needing to rest    Status  Partially Met      PT SHORT TERM GOAL #5   Title  Pt will consistently perform sit <> stand from standard chair with mod I, normal BOS, and  symmetrical LE placement.    Baseline  09/07/17: pt continues to need a wide base of support to stand    Status  Not Met      PT SHORT TERM GOAL #6   Title  improve BERG balance score to >/= 38/56 for improved balance    Baseline  09/07/17: 39/56 scored today    Status  Achieved        PT Long Term Goals - 08/26/17 1608      PT LONG TERM GOAL #1   Title  Pt will independently demo HEP to maximize functional gains made in PT.    Status  On-going    Target Date  10/07/17      PT LONG TERM GOAL #2   Title  improve gait velocity to > 1.8 ft/sec for decreased fall risk    Status  On-going    Target Date  10/07/17      PT LONG TERM GOAL #3   Title  improve timed up and go to < 20 sec with LRAD for improved function and mobility    Status  On-going    Target Date  10/07/17      PT LONG TERM GOAL #4   Title  amb > 400' on indoor/paved outdoor surfaces with LRAD mod I for improved community access    Status  On-going    Target Date  10/07/17      PT LONG TERM GOAL #5   Title  negotiate stairs with supervision for improved functional mobility    Status  On-going    Target Date  10/07/17      PT LONG TERM GOAL #6   Title  improve BERG balance score to >/= 45/56 for improved balance and decreased fall risk.    Status  New    Target Date  10/07/17         Plan - 09/30/17 1620    Clinical Impression Statement  Today's skilled session focused on increased gait distance with rollator, stair training for home safety, and bilateral LE strengthening. Pt is making progress towards gait distance goal with use of rollator. However, is not on pace to the rest of long term goals at time point due to lack of motivation and HEP compliance.     History and Personal  Factors relevant to plan of care:  3 comorbidities, functional decline since d/c fom PT in 2017.    Clinical Presentation  Evolving    Clinical Presentation due to:  functional deline, fall risk    Clinical Decision Making  Moderate    Rehab Potential  Fair    PT Frequency  2x / week    PT Duration  8 weeks    PT Treatment/Interventions  ADLs/Self Care Home Management;Cryotherapy;Electrical Stimulation;Moist Heat;Therapeutic exercise;Therapeutic activities;Functional mobility training;Stair training;Patient/family education;Gait training;DME Instruction;Ultrasound;Balance training;Neuromuscular re-education;Orthotic Fit/Training;Manual techniques;Taping    PT Next Visit Plan  Pt is aware that next PT visit is her last one scheduled and will likely be d/c after. Pt plans to bring new rollator next tx. Plan to perform gait with rollator and for gait tests.     Consulted and Agree with Plan of Care  Patient       Patient will benefit from skilled therapeutic intervention in order to improve the following deficits and impairments:     Visit Diagnosis: Other abnormalities of gait and mobility  Muscle weakness (generalized)  Unsteadiness on feet     Problem List Patient Active Problem List  Diagnosis Date Noted  . Breast cancer screening by mammogram 04/28/2017  . 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 Tobacco abuse 08/16/2015  . Peripheral neuropathy 07/17/2015  . Cervical cord compression with myelopathy ([place]) 02/08/2015  . Frozen shoulder--left 09/05/2014  . Unintentional weight loss 06/20/2014  . Health care maintenance 03/23/2012  . Essential hypertension 08/17/2006  . GERD 08/17/2006  . History of diabetes mellitus, type II 08/11/2006  . Hyperlipidemia 08/11/2006  . Osteoarthritis 08/11/2006    Carlena Sax 09/30/2017, 4:37 PM  [facility] [address] [place], [place], [address] Phone: [phone]   Fax:   [phone]  Name: Meosha Castanon MRN: [id number] Date of Birth: 11-23-1955

## 2017-10-07 ENCOUNTER — Encounter: Payer: Self-pay | Admitting: Physical Therapy

## 2017-10-07 ENCOUNTER — Ambulatory Visit: Payer: Medicare Other | Attending: Physical Medicine & Rehabilitation | Admitting: Physical Therapy

## 2017-10-07 DIAGNOSIS — R2681 Unsteadiness on feet: Secondary | ICD-10-CM | POA: Insufficient documentation

## 2017-10-07 DIAGNOSIS — M6281 Muscle weakness (generalized): Secondary | ICD-10-CM | POA: Diagnosis not present

## 2017-10-07 DIAGNOSIS — R2689 Other abnormalities of gait and mobility: Secondary | ICD-10-CM | POA: Insufficient documentation

## 2017-10-08 NOTE — Therapy (Addendum)
[place] [address] [place], [place], [address] Phone: [phone]   Fax:  [phone]  Physical Therapy Treatment/Discharge  Patient Details  Name: Julie Elliott MRN: [id number] Date of Birth: 1956-05-09 Referring Provider: Dr. Alysia Penna   Encounter Date: 10/07/2017  PT End of Session - 10/07/17 1613    Visit Number  15    Number of Visits  16    Date for PT Re-Evaluation  10/07/17    Authorization Type  Medicare/Medicaid    PT Start Time  1532    PT Stop Time  1613    PT Time Calculation (min)  41 min    Equipment Utilized During Treatment  Gait belt    Activity Tolerance  Patient tolerated treatment well;Patient limited by fatigue    Behavior During Therapy  [facility] for tasks assessed/performed       Past Medical History:  Diagnosis Date  . CHF (congestive heart failure) ([place])   . Chronic headaches   . DJD (degenerative joint disease)   . DM (diabetes mellitus) ([place]) 2008   stable HgBA1C at 6.5  . GERD (gastroesophageal reflux disease)    well controlled on Omeprazole  . HLD (hyperlipidemia) 2008   stable, well controlled  . Hypertension    well controlled    Past Surgical History:  Procedure Laterality Date  . carpel tunnel Right    20 yrs ago  . CERVICAL SPINE SURGERY  2016  . TUBAL LIGATION      There were no vitals filed for this visit.  Subjective Assessment - 10/07/17 1535    Subjective  No new complaints. No falls or pain to report.     Patient is accompained by:  Family member    Limitations  Walking    Patient Stated Goals  improve balance, negotiate stairs safely    Currently in Pain?  No/denies    Pain Score  0-No pain         [facility] PT Assessment - 10/07/17 1541      Standardized Balance Assessment   Standardized Balance Assessment  Berg Balance Test;Timed Up and Go Test      Berg Balance Test   Sit to Stand  Able to stand  independently using hands    Standing  Unsupported  Able to stand safely 2 minutes    Sitting with Back Unsupported but Feet Supported on Floor or Stool  Able to sit safely and securely 2 minutes    Stand to Sit  Controls descent by using hands    Transfers  Able to transfer safely, definite need of hands    Standing Unsupported with Eyes Closed  Able to stand 10 seconds safely    Standing Ubsupported with Feet Together  Able to place feet together independently and stand for 1 minute with supervision    From Standing, Reach Forward with Outstretched Arm  Can reach forward >12 cm safely (5") 5 inches    From Standing Position, Pick up Object from Floor  Able to pick up shoe, needs supervision    From Standing Position, Turn to Look Behind Over each Shoulder  Turn sideways only but maintains balance    Turn 360 Degrees  Able to turn 360 degrees safely but slowly    Standing Unsupported, Alternately Place Feet on Step/Stool  Able to complete >2 steps/needs minimal assist    Standing Unsupported, One Foot in Front  Able to take small step independently and hold 30 seconds  Standing on One Leg  Tries to lift leg/unable to hold 3 seconds but remains standing independently    Total Score  38      Timed Up and Go Test   TUG  Normal TUG    Normal TUG (seconds)  26.09    TUG Comments  with rollator          [facility] Adult PT Treatment/Exercise - 10/07/17 1541      Transfers   Transfers  Sit to Stand;Stand to Sit    Sit to Stand  5: Supervision;With upper extremity assist;From bed;From chair/3-in-1    Stand to Sit  5: Supervision;To bed;To chair/3-in-1;With upper extremity assist      Ambulation/Gait   Ambulation/Gait  Yes    Ambulation/Gait Assistance  5: Supervision    Ambulation/Gait Assistance Details  no balance issues noted. demo'd good hand placement on brakes of rollator. continues with narrow base of support and decreaesed step/stride length     Ambulation Distance (Feet)  414 Feet    Assistive device  Rollator     Ambulation Surface  Level;Indoor    Gait velocity  15.44 sec's= 2.12 sec's with rollator    Stairs  Yes    Stairs Assistance  5: Supervision    Stairs Assistance Details (indicate cue type and reason)  forward with 2 rails to ascend, sideways with sinlge rail to descend. supervision with increased time/rocking needed to advance LE's up stairs in a wide stance.     Stair Management Technique  Two rails;Step to pattern;Sideways;One rail Right    Number of Stairs  4    Height of Stairs  6               PT Short Term Goals - 10/07/17 1539      PT SHORT TERM GOAL #1   Title  independent with initial HEP. (all STGs due 09/09/17)    Baseline  09/07/17: met today    Status  Achieved      PT SHORT TERM GOAL #2   Title  improve gait velocity to > 1.4 ft/sec for improved function    Baseline  09/07/17: increased to 1.19, improved just not to goal    Status  Partially Met      PT SHORT TERM GOAL #3   Title  perform BERG with STG and LTG to be written    Status  Achieved      PT SHORT TERM GOAL #4   Title  amb > 250' with LRAD with supervision for improved mobility and function    Baseline  09/07/17: max distance of 185 feet with small based quad cane before needing to rest    Status  Partially Met      PT SHORT TERM GOAL #5   Title  Pt will consistently perform sit <> stand from standard chair with mod I, normal BOS, and symmetrical LE placement.    Baseline  09/07/17: pt continues to need a wide base of support to stand    Status  Not Met      PT SHORT TERM GOAL #6   Title  improve BERG balance score to >/= 38/56 for improved balance    Baseline  09/07/17: 39/56 scored today    Status  Achieved        PT Long Term Goals - 10/07/17 1540      PT LONG TERM GOAL #1   Title  Pt will independently demo HEP to maximize functional gains made  in PT.    Status  Achieved      PT LONG TERM GOAL #2   Title  improve gait velocity to > 1.8 ft/sec for decreased fall risk    Baseline  10/07/17:  2.12 ft/sec with rollator    Status  Achieved      PT LONG TERM GOAL #3   Title  improve timed up and go to < 20 sec with LRAD for improved function and mobility    Baseline  10/07/17: 26.09 sec's with rollator. improved from eval just not to goal    Status  Partially Met      PT LONG TERM GOAL #4   Title  amb > 400' on indoor/paved outdoor surfaces with LRAD mod I for improved community access    Baseline  10/07/17: met with indoor surfaces today (deferred outdoors due to cold weather, reports walking with rollator on paved/gravel surfaces without issues)    Status  Achieved      PT LONG TERM GOAL #5   Title  negotiate stairs with supervision for improved functional mobility    Baseline  10/07/17: met today     Status  Achieved      PT LONG TERM GOAL #6   Title  improve BERG balance score to >/= 45/56 for improved balance and decreased fall risk.    Baseline  10/07/17: 38/56 scored today. decreased by 1 point from last performance.     Status  Not Met            Plan - 10/07/17 1613    Clinical Impression Statement  Today's skilled session focused on progress toward LTGs for discharge. Pt is safe and independent with use of rollator for gait with improved endurance, gait velocity and timed up and go. Berg Balance test score decreased by 1 point this session comparied to previous testing. Pt and son agreeable to discharge today.     Rehab Potential  Fair    PT Frequency  2x / week    PT Duration  8 weeks    PT Treatment/Interventions  ADLs/Self Care Home Management;Cryotherapy;Electrical Stimulation;Moist Heat;Therapeutic exercise;Therapeutic activities;Functional mobility training;Stair training;Patient/family education;Gait training;DME Instruction;Ultrasound;Balance training;Neuromuscular re-education;Orthotic Fit/Training;Manual techniques;Taping    PT Next Visit Plan  discharge today per PT plan of care    Consulted and Agree with Plan of Care  Patient       Patient will benefit  from skilled therapeutic intervention in order to improve the following deficits and impairments:     Visit Diagnosis: Other abnormalities of gait and mobility  Muscle weakness (generalized)  Unsteadiness on feet     Problem List Patient Active Problem List   Diagnosis Date Noted  . Breast cancer screening by mammogram 04/28/2017  . 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 Tobacco abuse 08/16/2015  . Peripheral neuropathy 07/17/2015  . Cervical cord compression with myelopathy ([place]) 02/08/2015  . Frozen shoulder--left 09/05/2014  . Unintentional weight loss 06/20/2014  . Health care maintenance 03/23/2012  . Essential hypertension 08/17/2006  . GERD 08/17/2006  . History of diabetes mellitus, type II 08/11/2006  . Hyperlipidemia 08/11/2006  . Osteoarthritis 08/11/2006    Willow Ora, PTA, [place] [address], [place], [place] [address] [phone] 10/08/17, 9:17 AM   Name: Julie Elliott MRN: [id number] Date of Birth: [birth year]        PHYSICAL THERAPY DISCHARGE SUMMARY  Visits from Start of Care: 15  Current functional level related to goals / functional outcomes:  See above   Remaining deficits: See above; pt with spastic quadriplegia    Education / Equipment: HEP, rollator  Plan: Patient agrees to discharge.  Patient goals were partially met. Patient is being discharged due to being pleased with the current functional level.  ?????    Laureen Abrahams, PT, DPT 10/09/17 11:55 AM  [facility] [address]. [place], [place] [address]  [phone] (office) [phone] (fax)

## 2017-10-14 ENCOUNTER — Inpatient Hospital Stay: Admit: 2017-10-14 | Primary: Registered Nurse

## 2017-10-14 LAB — SENTARA SPECIMEN COLLN.

## 2017-10-26 ENCOUNTER — Ambulatory Visit: Payer: Medicare Other | Admitting: Physical Medicine & Rehabilitation

## 2017-10-27 ENCOUNTER — Ambulatory Visit (HOSPITAL_BASED_OUTPATIENT_CLINIC_OR_DEPARTMENT_OTHER): Payer: Medicare Other | Admitting: Physical Medicine & Rehabilitation

## 2017-10-27 ENCOUNTER — Encounter: Payer: Medicare Other | Attending: Physical Medicine & Rehabilitation

## 2017-10-27 ENCOUNTER — Encounter: Payer: Self-pay | Admitting: Physical Medicine & Rehabilitation

## 2017-10-27 VITALS — BP 134/83 | HR 103

## 2017-10-27 DIAGNOSIS — R51 Headache: Secondary | ICD-10-CM | POA: Insufficient documentation

## 2017-10-27 DIAGNOSIS — R600 Localized edema: Secondary | ICD-10-CM | POA: Diagnosis not present

## 2017-10-27 DIAGNOSIS — I509 Heart failure, unspecified: Secondary | ICD-10-CM | POA: Insufficient documentation

## 2017-10-27 DIAGNOSIS — Z9889 Other specified postprocedural states: Secondary | ICD-10-CM | POA: Diagnosis not present

## 2017-10-27 DIAGNOSIS — I10 Essential (primary) hypertension: Secondary | ICD-10-CM | POA: Diagnosis not present

## 2017-10-27 DIAGNOSIS — G8250 Quadriplegia, unspecified: Secondary | ICD-10-CM | POA: Diagnosis not present

## 2017-10-27 DIAGNOSIS — M1990 Unspecified osteoarthritis, unspecified site: Secondary | ICD-10-CM | POA: Diagnosis not present

## 2017-10-27 DIAGNOSIS — E119 Type 2 diabetes mellitus without complications: Secondary | ICD-10-CM | POA: Diagnosis not present

## 2017-10-27 DIAGNOSIS — G9520 Unspecified cord compression: Secondary | ICD-10-CM

## 2017-10-27 DIAGNOSIS — F17210 Nicotine dependence, cigarettes, uncomplicated: Secondary | ICD-10-CM | POA: Diagnosis not present

## 2017-10-27 DIAGNOSIS — E785 Hyperlipidemia, unspecified: Secondary | ICD-10-CM | POA: Diagnosis not present

## 2017-10-27 DIAGNOSIS — K219 Gastro-esophageal reflux disease without esophagitis: Secondary | ICD-10-CM | POA: Insufficient documentation

## 2017-10-27 NOTE — Progress Notes (Signed)
Subjective:    Patient ID: Julie Elliott, female    DOB: 23-Jul-1956, 62 y.o.   MRN: [id number]  HPI  Dysport injection 09/11/2017 Pectoralis 200 FDS 200 biceps, 200, long head  FCR, 100 Pronator teres 0  Gastroc 400 lateral head Soleus 200  Patient feels looser in the left hand as well as left foot and ankle area.  She complains of swelling in both lower limbs including her thighs.  At one point she was on a "water pill" Pain Inventory Average Pain 1 Pain Right Now 1 My pain is intermittent, dull and aching  In the last 24 hours, has pain interfered with the following? General activity 0 Relation with others 0 Enjoyment of life 0 What TIME of day is your pain at its worst? na Sleep (in general) Good  Pain is worse with: walking and standing Pain improves with: rest Relief from Meds: na  Mobility use a walker ability to climb steps?  no do you drive?  no  Function disabled: date disabled 35  Neuro/Psych weakness numbness tingling trouble walking spasms  Prior Studies Any changes since last visit?  no  Physicians involved in your care Any changes since last visit?  no   Family History  Problem Relation Age of Onset  . Diabetes Mother   . Heart disease Mother   . Prostate cancer Father   . Neuropathy Neg Hx    Social History   Socioeconomic History  . Marital status: Single    Spouse name: Not on file  . Number of children: 3  . Years of education: 72  . Highest education level: Not on file  Occupational History  . Occupation: Unemployed  Social Needs  . Financial resource strain: Not on file  . Food insecurity:    Worry: Not on file    Inability: Not on file  . Transportation needs:    Medical: Not on file    Non-medical: Not on file  Tobacco Use  . Smoking status: Current Some Day Smoker    Types: Cigarettes    Last attempt to quit: 04/18/2013    Years since quitting: 4.5  . Smokeless tobacco: Current User  . Tobacco comment: 1-2  cigs in the a.m.  Substance and Sexual Activity  . Alcohol use: No    Alcohol/week: 0.0 oz  . Drug use: No  . Sexual activity: Not on file  Lifestyle  . Physical activity:    Days per week: Not on file    Minutes per session: Not on file  . Stress: Not on file  Relationships  . Social connections:    Talks on phone: Not on file    Gets together: Not on file    Attends religious service: Not on file    Active member of club or organization: Not on file    Attends meetings of clubs or organizations: Not on file    Relationship status: Not on file  Other Topics Concern  . Not on file  Social History Narrative   to get supplies nfrom [employer] per pt request.1--[phone]   Lives at home with son.    Right handed.   Caffeine use: drinks coffee-rare   Past Surgical History:  Procedure Laterality Date  . carpel tunnel Right    20 yrs ago  . CERVICAL SPINE SURGERY  2016  . TUBAL LIGATION     Past Medical History:  Diagnosis Date  . CHF (congestive heart failure) ([place])   . Chronic  headaches   . DJD (degenerative joint disease)   . DM (diabetes mellitus) ([place]) 2008   stable HgBA1C at 6.5  . GERD (gastroesophageal reflux disease)    well controlled on Omeprazole  . HLD (hyperlipidemia) 2008   stable, well controlled  . Hypertension    well controlled   There were no vitals taken for this visit.  Opioid Risk Score:   Fall Risk Score:  `1  Depression screen PHQ 2/9  Depression screen [facility] 2/9 04/28/2017 11/11/2016 05/13/2016 11/06/2015 10/19/2015 08/14/2015 02/27/2015  Decreased Interest 0 0 0 0 1 0 0  Down, Depressed, Hopeless 0 0 0 0 0 0 0  PHQ - 2 Score 0 0 0 0 1 0 0  Altered sleeping - - - - 0 - -  Tired, decreased energy - - - - 0 - -  Change in appetite - - - - 0 - -  Feeling bad or failure about yourself  - - - - 0 - -  Trouble concentrating - - - - 0 - -  Moving slowly or fidgety/restless - - - - 0 - -  Suicidal thoughts - - - - 0 - -  PHQ-9 Score - - - - 1 - -    Difficult doing work/chores - - - - Not difficult at all - -  Some recent data might be hidden     Review of Systems  Constitutional: Positive for unexpected weight change.  HENT: Negative.   Eyes: Negative.   Respiratory: Negative.   Cardiovascular: Negative.   Gastrointestinal: Negative.   Endocrine: Negative.   Genitourinary: Negative.   Musculoskeletal: Positive for gait problem and myalgias.  Skin: Negative.   Allergic/Immunologic: Negative.   Hematological: Negative.   Psychiatric/Behavioral: Negative.   All other systems reviewed and are negative.      Objective:   Physical Exam  Constitutional: She is oriented to person, place, and time. She appears well-developed and well-nourished. No distress.  HENT:  Head: Normocephalic and atraumatic.  Eyes: Pupils are equal, round, and reactive to light. Conjunctivae are normal.  Neck: Normal range of motion.  Cardiovascular: Normal rate, regular rhythm and normal heart sounds.  No murmur heard. Pulmonary/Chest: Breath sounds normal. No respiratory distress. She has no wheezes.  Abdominal: Soft. Bowel sounds are normal. She exhibits no distension. There is no tenderness.  Neurological: She is alert and oriented to person, place, and time.  Skin: She is not diaphoretic.  Psychiatric: She has a normal mood and affect.  Nursing note and vitals reviewed.   Motor strength is 3- at the left deltoid to minus at bicep tricep trace at the finger flexors and extensors as well as wrist flexors and extensors 4- in the left hip flexor knee extensor 3- in the left ankle dorsiflexor 4/5 in the right deltoid bicep tricep grip hip flexion knee extension ankle dorsiflexion Tone MAS 2 at the pectoralis MAS 3/4 in the left biceps MAS 0 at the finger flexors and wrist flexors MAS 2 at the lumbricals on the left MAS 1 at the left ankle plantar flexors 2 at the quads Mood and affect are appropriate  Pitting edema bilateral ankles 1+ on the  right 2+ on the left Lungs clear to auscultation heart regular rate and rhythm no rubs murmurs or extra sounds    Assessment & Plan:  1.  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 She has had good results with spasticity reduction in the left upper  and left lower extremity with the exception of left elbow which appears to have a joint contracture. We discussed that Botox does not enhan ce motor recovery but instead simply reduce his muscle tone in overactive muscle groups.  Patient feels that it is helping her in this regard but is disappointed that she has plateaued in terms of her neurologic recovery.  We will schedule next injection approximately 3-1/2 months from 09/11/2017  2.  Lower extremity edema likely stasis, instructed patient to follow-up with PCP may need to be restarted on her diuretic

## 2017-10-27 NOTE — Patient Instructions (Signed)
Dysport, repeat in ~7wks

## 2017-11-06 ENCOUNTER — Inpatient Hospital Stay: Admit: 2017-11-06 | Payer: PRIVATE HEALTH INSURANCE | Primary: Registered Nurse

## 2017-11-06 ENCOUNTER — Encounter

## 2017-11-06 DIAGNOSIS — S0993XA Unspecified injury of face, initial encounter: Secondary | ICD-10-CM

## 2017-12-15 ENCOUNTER — Ambulatory Visit (HOSPITAL_BASED_OUTPATIENT_CLINIC_OR_DEPARTMENT_OTHER): Payer: Medicare Other | Admitting: Physical Medicine & Rehabilitation

## 2017-12-15 ENCOUNTER — Encounter: Payer: Medicare Other | Attending: Physical Medicine & Rehabilitation

## 2017-12-15 ENCOUNTER — Encounter: Payer: Self-pay | Admitting: Physical Medicine & Rehabilitation

## 2017-12-15 VITALS — BP 128/84 | HR 97 | Resp 14 | Ht 63.0 in | Wt 224.0 lb

## 2017-12-15 DIAGNOSIS — R51 Headache: Secondary | ICD-10-CM | POA: Insufficient documentation

## 2017-12-15 DIAGNOSIS — E785 Hyperlipidemia, unspecified: Secondary | ICD-10-CM | POA: Diagnosis not present

## 2017-12-15 DIAGNOSIS — E119 Type 2 diabetes mellitus without complications: Secondary | ICD-10-CM | POA: Diagnosis not present

## 2017-12-15 DIAGNOSIS — I509 Heart failure, unspecified: Secondary | ICD-10-CM | POA: Diagnosis not present

## 2017-12-15 DIAGNOSIS — G8250 Quadriplegia, unspecified: Secondary | ICD-10-CM

## 2017-12-15 DIAGNOSIS — I10 Essential (primary) hypertension: Secondary | ICD-10-CM | POA: Insufficient documentation

## 2017-12-15 DIAGNOSIS — K219 Gastro-esophageal reflux disease without esophagitis: Secondary | ICD-10-CM | POA: Insufficient documentation

## 2017-12-15 DIAGNOSIS — M1990 Unspecified osteoarthritis, unspecified site: Secondary | ICD-10-CM | POA: Diagnosis not present

## 2017-12-15 DIAGNOSIS — F17210 Nicotine dependence, cigarettes, uncomplicated: Secondary | ICD-10-CM | POA: Diagnosis not present

## 2017-12-15 DIAGNOSIS — Z9889 Other specified postprocedural states: Secondary | ICD-10-CM | POA: Insufficient documentation

## 2017-12-15 NOTE — Progress Notes (Signed)
Dysport Injection for spasticity using needle EMG guidance  Dilution: 200 Units/ml Indication: Severe spasticity which interferes with ADL,mobility and/or  hygiene and is unresponsive to medication management and other conservative care Informed consent was obtained after describing risks and benefits of the procedure with the patient. This includes bleeding, bruising, infection, excessive weakness, or medication side effects. A REMS form is on file and signed. Needle:  needle electrode Number of units per muscle Pectoralis 200 FDS 200 biceps, 200, long head  FCR, 100 Pronator teres 0  Gastroc 400 lateral head Soleus 200 AddPoll 50 Lumbricals 150 All injections were done after obtaining appropriate EMG activity and after negative drawback for blood. The patient tolerated the procedure well. Post procedure instructions were given. A followup appointment was made.  EMG activity overall improved should be able to reduce the dose to 1000U next injection (no pec or biceps  Reduce gastroc to 200) depending on clinical effect

## 2017-12-15 NOTE — Patient Instructions (Signed)

## 2017-12-22 ENCOUNTER — Encounter: Payer: Self-pay | Admitting: Podiatry

## 2017-12-22 ENCOUNTER — Ambulatory Visit (INDEPENDENT_AMBULATORY_CARE_PROVIDER_SITE_OTHER): Payer: Medicare Other | Admitting: Podiatry

## 2017-12-22 DIAGNOSIS — M79672 Pain in left foot: Secondary | ICD-10-CM | POA: Diagnosis not present

## 2017-12-22 DIAGNOSIS — M79671 Pain in right foot: Secondary | ICD-10-CM

## 2017-12-22 DIAGNOSIS — L600 Ingrowing nail: Secondary | ICD-10-CM

## 2017-12-22 DIAGNOSIS — B351 Tinea unguium: Secondary | ICD-10-CM | POA: Diagnosis not present

## 2017-12-22 NOTE — Progress Notes (Signed)
Subjective: 62 y.o. year old female patient presents complaining of painful nails. Patient requests toe nails, corns and calluses trimmed.  Stated that her blood sugar is under control and not on any medication.  Taking Lyrica for peripheral neuropathy.  Objective: Dermatologic: Thick yellow deformed nails x 10. Dystrophic ingrown nails with pain both great toes. Vascular: Pedal pulses are all palpable. Orthopedic: Contracted lesser digits  Neurologic: All epicritic and tactile sensations grossly intact.  Assessment: Dystrophic mycotic nails x 10. Dystrophic ingrown nail both great toes. Peripheral neuropathy. Bilateral lower limb edema. Tinea pedis bilateral.  Treatment: All mycotic nails debrided.  Need to wear compression socks during the day with open toed shoes. Use antifungal cream at night. Use antifungal powder in between digits after each shower and dried well. Return in 3 months or sooner if needed.

## 2017-12-22 NOTE — Patient Instructions (Signed)
Seen for hypertrophic nails. Noted of swollen lower limbs, peeling fungal infected skin on both feet in between digits. Need to wear compression socks during the day with open toed shoes. Use antifungal cream at night. Use antifungal powder in between digits after each shower and dried well. All nails debrided. Return in 3 months or sooner if needed.

## 2018-02-02 ENCOUNTER — Encounter: Payer: Medicare Other | Attending: Physical Medicine & Rehabilitation

## 2018-02-02 ENCOUNTER — Encounter: Payer: Self-pay | Admitting: Physical Medicine & Rehabilitation

## 2018-02-02 ENCOUNTER — Ambulatory Visit (HOSPITAL_BASED_OUTPATIENT_CLINIC_OR_DEPARTMENT_OTHER): Payer: Medicare Other | Admitting: Physical Medicine & Rehabilitation

## 2018-02-02 VITALS — BP 142/84 | HR 101 | Resp 14 | Ht 63.0 in | Wt 224.0 lb

## 2018-02-02 DIAGNOSIS — Z9889 Other specified postprocedural states: Secondary | ICD-10-CM | POA: Diagnosis not present

## 2018-02-02 DIAGNOSIS — M1990 Unspecified osteoarthritis, unspecified site: Secondary | ICD-10-CM | POA: Diagnosis not present

## 2018-02-02 DIAGNOSIS — F17210 Nicotine dependence, cigarettes, uncomplicated: Secondary | ICD-10-CM | POA: Diagnosis not present

## 2018-02-02 DIAGNOSIS — R51 Headache: Secondary | ICD-10-CM | POA: Insufficient documentation

## 2018-02-02 DIAGNOSIS — E119 Type 2 diabetes mellitus without complications: Secondary | ICD-10-CM | POA: Insufficient documentation

## 2018-02-02 DIAGNOSIS — K219 Gastro-esophageal reflux disease without esophagitis: Secondary | ICD-10-CM | POA: Insufficient documentation

## 2018-02-02 DIAGNOSIS — G8250 Quadriplegia, unspecified: Secondary | ICD-10-CM

## 2018-02-02 DIAGNOSIS — I10 Essential (primary) hypertension: Secondary | ICD-10-CM | POA: Diagnosis not present

## 2018-02-02 DIAGNOSIS — Z5181 Encounter for therapeutic drug level monitoring: Secondary | ICD-10-CM | POA: Diagnosis not present

## 2018-02-02 DIAGNOSIS — I509 Heart failure, unspecified: Secondary | ICD-10-CM | POA: Diagnosis not present

## 2018-02-02 DIAGNOSIS — E785 Hyperlipidemia, unspecified: Secondary | ICD-10-CM | POA: Insufficient documentation

## 2018-02-02 DIAGNOSIS — Z79891 Long term (current) use of opiate analgesic: Secondary | ICD-10-CM | POA: Diagnosis not present

## 2018-02-02 DIAGNOSIS — G894 Chronic pain syndrome: Secondary | ICD-10-CM

## 2018-02-02 NOTE — Patient Instructions (Signed)
Will reduce Dysport dose next time

## 2018-02-02 NOTE — Progress Notes (Signed)
Subjective:    Patient ID: Julie Elliott, female    DOB: [birth year], 62 y.o.   MRN: [id number]  HPI 62 year old female with cervical myelopathy and spastic paresis.  Her spasticity is worse on the left side.  She has undergone both Botox as well as Dysport injections. Most recent injection was 6 weeks ago, Dysport Pectoralis 200 FDS 200 biceps, 200, long head   FCR, 100 Pronator teres 0   Gastroc 400 lateral head Soleus 200 AddPoll 50 Lumbricals 150  Monitoring needle activity with electromyography there was little activity in the biceps and pectoralis. Clinically the patient feels like her upper extremity tone has improved however her lower extremity tone has not improved.  Still experiences difficulty with extending her toes and raising her foot.  Patient states she has had lower extremity swelling.  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  We also discussed that Lyrica can cause peripheral edema but she states that she has had this even prior to starting Lyrica.  Pain Inventory Average Pain 0 Pain Right Now 0 My pain is intermittent, burning, dull, tingling and aching  In the last 24 hours, has pain interfered with the following? General activity 0 Relation with others 0 Enjoyment of life 0 What TIME of day is your pain at its worst? varies Sleep (in general) Good  Pain is worse with: walking Pain improves with: rest Relief from Meds: 10  Mobility walk with assistance use a cane use a walker ability to climb steps?  no do you drive?  no needs help with transfers Do you have any goals in this area?  no  Function disabled: date disabled . I need assistance with the following:  feeding, dressing, bathing, toileting, meal prep, household duties and shopping  Neuro/Psych weakness tingling trouble walking spasms  Prior Studies Any changes since last visit?  no  Physicians involved in your care Any changes since last visit?   no   Family History  Problem Relation Age of Onset  . Diabetes Mother   . Heart disease Mother   . Prostate cancer Father   . Neuropathy Neg Hx    Social History   Socioeconomic History  . Marital status: Single    Spouse name: Not on file  . Number of children: 3  . Years of education: 10  . Highest education level: Not on file  Occupational History  . Occupation: Unemployed  Social Needs  . Financial resource strain: Not on file  . Food insecurity:    Worry: Not on file    Inability: Not on file  . Transportation needs:    Medical: Not on file    Non-medical: Not on file  Tobacco Use  . Smoking status: Current Some Day Smoker    Types: Cigarettes    Last attempt to quit: 04/18/2013    Years since quitting: 4.7  . Smokeless tobacco: Current User  . Tobacco comment: 1-2 cigs in the a.m.  Substance and Sexual Activity  . Alcohol use: No    Alcohol/week: 0.0 oz  . Drug use: No  . Sexual activity: Not on file  Lifestyle  . Physical activity:    Days per week: Not on file    Minutes per session: Not on file  . Stress: Not on file  Relationships  . Social connections:    Talks on phone: Not on file    Gets together: Not on file    Attends religious service: Not  on file    Active member of club or organization: Not on file    Attends meetings of clubs or organizations: Not on file    Relationship status: Not on file  Other Topics Concern  . Not on file  Social History Narrative   to get supplies nfrom [facility] [employer] per pt request.1--[phone]   Lives at home with son.    Right handed.   Caffeine use: drinks coffee-rare   Past Surgical History:  Procedure Laterality Date  . carpel tunnel Right    20 yrs ago  . CERVICAL SPINE SURGERY  2016  . TUBAL LIGATION     Past Medical History:  Diagnosis Date  . CHF (congestive heart failure) ([place])   . Chronic headaches   . DJD (degenerative joint disease)   . DM (diabetes mellitus) ([place]) 2008   stable HgBA1C at 6.5   . GERD (gastroesophageal reflux disease)    well controlled on Omeprazole  . HLD (hyperlipidemia) 2008   stable, well controlled  . Hypertension    well controlled   BP (!) 142/84   Pulse (!) 101   Resp 14   Ht 5\' 3"  (1.6 m)   Wt 224 lb (101.6 kg)   SpO2 96%   BMI 39.68 kg/m   Opioid Risk Score:   Fall Risk Score:  `1  Depression screen PHQ 2/9  Depression screen [facility] 2/9 12/15/2017 04/28/2017 11/11/2016 05/13/2016 11/06/2015 10/19/2015 08/14/2015  Decreased Interest 0 0 0 0 0 1 0  Down, Depressed, Hopeless 0 0 0 0 0 0 0  PHQ - 2 Score 0 0 0 0 0 1 0  Altered sleeping - - - - - 0 -  Tired, decreased energy - - - - - 0 -  Change in appetite - - - - - 0 -  Feeling bad or failure about yourself  - - - - - 0 -  Trouble concentrating - - - - - 0 -  Moving slowly or fidgety/restless - - - - - 0 -  Suicidal thoughts - - - - - 0 -  PHQ-9 Score - - - - - 1 -  Difficult doing work/chores - - - - - Not difficult at all -  Some recent data might be hidden    Review of Systems  Constitutional: Negative.   HENT: Negative.   Eyes: Negative.   Respiratory: Negative.   Cardiovascular: Negative.   Gastrointestinal: Negative.   Musculoskeletal: Positive for gait problem.       Spasms   Allergic/Immunologic: Negative.   Neurological: Positive for weakness.       Tingling   Hematological: Negative.   Psychiatric/Behavioral: Negative.   All other systems reviewed and are negative.      Objective:   Physical Exam  Constitutional: She appears well-developed and well-nourished. No distress.  HENT:  Head: Normocephalic and atraumatic.  Eyes: Pupils are equal, round, and reactive to light. EOM are normal.  Neurological: She exhibits abnormal muscle tone. Gait abnormal.  Gait is using a walker she has poor heel strike on the left side.  Skin: Skin is warm and dry. She is not diaphoretic.  Psychiatric: She has a normal mood and affect.  Nursing note and vitals reviewed.   Motor  strength is 3- in the biceps 3- in the left deltoid to minus at the left finger flexors and 3- left finger extensors. Ankle dorsiflexors are trace ankle plantar flexors are 2-. Tone MAS 4 at the left elbow  and with a firm endpoint MAS 2 at the finger flexors MAS 2 at the lumbricals MAS 1 at the thumb adductor, MAS 4 in the plantar flexors  1-2+ pretibial edema pitting     Assessment & Plan:  #1.  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 Overall she has improved with her left upper extremity tone. Given her bony ankylosis left elbow we will not inject the left bicep. We will try holding injection on pectoralis and observe for increased shoulder adductor spasticity. Given lack of functional improvements with gait will not inject lower extremity. Plan in 6 weeks is for Dysport FDS 200 FCR, 100  AddPoll 50 Lumbricals 150  2.  Lower extremity swelling she will follow-up with her PCP.  Indicates that this was present even prior to starting Lyrica, her last echo was in 2007 and showed a normal ejection fraction

## 2018-02-22 ENCOUNTER — Encounter: Payer: Self-pay | Admitting: *Deleted

## 2018-02-26 ENCOUNTER — Other Ambulatory Visit: Payer: Self-pay

## 2018-02-26 ENCOUNTER — Ambulatory Visit (INDEPENDENT_AMBULATORY_CARE_PROVIDER_SITE_OTHER): Payer: Medicare Other | Admitting: Internal Medicine

## 2018-02-26 ENCOUNTER — Other Ambulatory Visit: Payer: Self-pay | Admitting: Neurology

## 2018-02-26 ENCOUNTER — Encounter: Payer: Self-pay | Admitting: Internal Medicine

## 2018-02-26 VITALS — BP 132/80 | HR 97 | Temp 98.3°F | Ht 63.0 in | Wt 227.1 lb

## 2018-02-26 DIAGNOSIS — Z79899 Other long term (current) drug therapy: Secondary | ICD-10-CM | POA: Diagnosis not present

## 2018-02-26 DIAGNOSIS — M25551 Pain in right hip: Secondary | ICD-10-CM | POA: Diagnosis not present

## 2018-02-26 DIAGNOSIS — Z8639 Personal history of other endocrine, nutritional and metabolic disease: Secondary | ICD-10-CM

## 2018-02-26 DIAGNOSIS — R600 Localized edema: Secondary | ICD-10-CM | POA: Diagnosis not present

## 2018-02-26 DIAGNOSIS — I10 Essential (primary) hypertension: Secondary | ICD-10-CM | POA: Diagnosis not present

## 2018-02-26 DIAGNOSIS — R0989 Other specified symptoms and signs involving the circulatory and respiratory systems: Secondary | ICD-10-CM

## 2018-02-26 DIAGNOSIS — Z0000 Encounter for general adult medical examination without abnormal findings: Secondary | ICD-10-CM

## 2018-02-26 DIAGNOSIS — I110 Hypertensive heart disease with heart failure: Secondary | ICD-10-CM

## 2018-02-26 DIAGNOSIS — M4712 Other spondylosis with myelopathy, cervical region: Secondary | ICD-10-CM

## 2018-02-26 DIAGNOSIS — I509 Heart failure, unspecified: Secondary | ICD-10-CM | POA: Diagnosis not present

## 2018-02-26 DIAGNOSIS — Z7982 Long term (current) use of aspirin: Secondary | ICD-10-CM

## 2018-02-26 DIAGNOSIS — F17210 Nicotine dependence, cigarettes, uncomplicated: Secondary | ICD-10-CM | POA: Diagnosis not present

## 2018-02-26 DIAGNOSIS — Z981 Arthrodesis status: Secondary | ICD-10-CM

## 2018-02-26 DIAGNOSIS — G959 Disease of spinal cord, unspecified: Secondary | ICD-10-CM

## 2018-02-26 LAB — GLUCOSE, CAPILLARY: GLUCOSE-CAPILLARY: 78 mg/dL (ref 70–99)

## 2018-02-26 LAB — POCT GLYCOSYLATED HEMOGLOBIN (HGB A1C): Hemoglobin A1C: 5.2 % (ref 4.0–5.6)

## 2018-02-26 MED ORDER — FUROSEMIDE 20 MG PO TABS: 20.0000 mg | ORAL_TABLET | Freq: Two times a day (BID) | ORAL | 0 refills | Status: DC

## 2018-02-26 MED ORDER — SENNOSIDES-DOCUSATE SODIUM 8.6-50 MG PO TABS: 2.0000 | ORAL_TABLET | Freq: Every evening | ORAL | 2 refills | Status: DC | PRN

## 2018-02-26 NOTE — Progress Notes (Signed)
   CC: Lower extremity edema  HPI: [name] is a 62 y.o. F w/ PMH of CHF, HTN, T2DM, cervical myelopathy s/p cervical arthrodesis in 2016 presenting to the clinic with her son for yearly check-up. She has had ongoing physical therapy since her surgery up to 10/2017 and instructed to do exercise at home. She states she has not been able to do any of the instructed exercise at home 2/2 joint pain, especially on her right hip. She states she also gets short of breath and fatigued whenever she tries to exercise. She denies any chest pain, palpitations, wheezing, cough  Her son informs [place] that she had a history of hypertension, congestive heart failure and diabetes but these issues 'disappeared after she lost ~60 lbs couple years ago' presumably 2/2 her cervical myelopathy. However since her surgery she has regained much of her weight back and is currently 230lbs but they were unable to return to clinic for assessment of her chronic conditions due to scheduling conflicts. She is currently only on medications prescribed by her neurologist, which includes Lyrica, Senna and ASA.  Past Medical History:  Diagnosis Date  . CHF (congestive heart failure) ([place])   . Chronic headaches   . DJD (degenerative joint disease)   . DM (diabetes mellitus) ([place]) 2008   stable HgBA1C at 6.5  . GERD (gastroesophageal reflux disease)    well controlled on Omeprazole  . HLD (hyperlipidemia) 2008   stable, well controlled  . Hypertension    well controlled   Review of Systems: Review of Systems  Constitutional: Negative for chills, fever and malaise/fatigue.       Weight gain  Respiratory: Negative for cough, sputum production, shortness of breath and wheezing.   Cardiovascular: Positive for orthopnea and leg swelling. Negative for chest pain and palpitations.  Musculoskeletal: Positive for joint pain. Negative for back pain, falls and neck pain.  Neurological: Negative for dizziness, sensory change, weakness  and headaches.     Physical Exam: Vitals:   02/26/18 1423  BP: 132/80  Pulse: 97  Temp: 98.3 F (36.8 C)  SpO2: 98%    Physical Exam  Constitutional: She appears well-developed and well-nourished. No distress.  Neck: Normal range of motion. Neck supple. No JVD present.  Cardiovascular: Normal rate, regular rhythm, normal heart sounds and intact distal pulses.  Respiratory: Effort normal. No respiratory distress. She has no wheezes. She has rales (Crackles on lower lobes bilaterally).  GI: Soft. Bowel sounds are normal. She exhibits no distension. There is no tenderness. There is no guarding.  Musculoskeletal: Normal range of motion. She exhibits edema (3+ pitting edema on bilateral lower extremities. No ulcers or varicose veins.). She exhibits no tenderness or deformity.  Lymphadenopathy:    She has no cervical adenopathy.  Skin: Skin is warm and dry.  Onychomycosis on toenails    Assessment & Plan:   See Encounters Tab for problem based charting.  Patient seen with Dr. Evette Doffing   -Julie Elliott, PGY1

## 2018-02-26 NOTE — Patient Instructions (Addendum)
Thank you for coming Julie Elliott  We are sending a water pill to help you pee out the excess fluid in your lungs. Please take them as prescribed. Also please make sure to follow up with the heart scan. Please follow up in 1 month  -Gilberto Better, MD

## 2018-02-27 ENCOUNTER — Encounter: Payer: Self-pay | Admitting: Internal Medicine

## 2018-02-27 DIAGNOSIS — R600 Localized edema: Secondary | ICD-10-CM | POA: Insufficient documentation

## 2018-02-27 LAB — BMP8+ANION GAP
ANION GAP: 16 mmol/L (ref 10.0–18.0)
BUN/Creatinine Ratio: 15 (ref 12–28)
BUN: 9 mg/dL (ref 8–27)
CALCIUM: 8.6 mg/dL — AB (ref 8.7–10.3)
CO2: 21 mmol/L (ref 20–29)
Chloride: 107 mmol/L — ABNORMAL HIGH (ref 96–106)
Creatinine, Ser: 0.6 mg/dL (ref 0.57–1.00)
GFR calc Af Amer: 115 mL/min/{1.73_m2} (ref >59)
GFR, EST NON AFRICAN AMERICAN: 99 mL/min/{1.73_m2} (ref >59)
Glucose: 73 mg/dL (ref 65–99)
POTASSIUM: 3.8 mmol/L (ref 3.5–5.2)
SODIUM: 144 mmol/L (ref 134–144)

## 2018-02-27 NOTE — Assessment & Plan Note (Signed)
-   Prior records indicate her diabetes resolved with unintentional weight loss in 2017 (~200 -> 170lbs) - Weight at clinic today 227 lbs, above pre-2016 weight - Denies any polyphagia, polyuria, polydipsia - Will recheck A1c today (Last A1c 5.4 08/14/2015)

## 2018-02-27 NOTE — Assessment & Plan Note (Addendum)
-   Last colonoscopy 07/21/08, Last mammogram 11/13/14 - Patient is overdue for Tdap and Pneumovar 13 and mammogram - Patient declined Tdap, Pneumovar13, and Shingrix/Zostavax - States 'she doesn't get sick like that.' - Explained the benefits and risks of vaccination and patient verbalizes understanding. - Provided additional information on specific vaccines' benefits and risks - Would like to discuss mammogram at a later time after her acute issues are resolved

## 2018-02-27 NOTE — Assessment & Plan Note (Signed)
BP Readings from Last 3 Encounters:  02/26/18 132/80  02/02/18 (!) 142/84  12/15/17 128/84   - Patient's blood pressure almost at goal - Currently not on any bp meds, but has history of essential hypertension - Denies any headaches, chest pain, palpitations - BMP today to establish baseline as pt has had no labs for 12 months

## 2018-02-27 NOTE — Assessment & Plan Note (Signed)
-   Patient presents with significant 3+ pitting edema on bilateral lower extremities - No mention of edema on last visit (04/11/17), presumed to be new - Patient w/ ~15 pound weight gain since last visit - Physical exam: +rales, - JVD - Patient's son states that patient has hx of CHF which resolved when she was down to 170 pounds in 2017  - Bilateral lower extremity edema 2/2 CHF vs Lymphedema - Lung exam findings and pt's history indicate CHF as most likely cause - Order for Echo - Start Lasix 20mg  BID

## 2018-03-01 NOTE — Progress Notes (Signed)
Internal Medicine Clinic Attending  I saw and evaluated the patient.  I personally confirmed the key portions of the history and exam documented by Dr. Lee and I reviewed pertinent patient test results.  The assessment, diagnosis, and plan were formulated together and I agree with the documentation in the resident's note.  

## 2018-03-02 ENCOUNTER — Ambulatory Visit (HOSPITAL_COMMUNITY)
Admission: RE | Admit: 2018-03-02 | Discharge: 2018-03-02 | Disposition: A | Discharge status: Home / Self Care | Payer: Medicare Other | Source: Ambulatory Visit | Attending: Student in an Organized Health Care Education/Training Program | Admitting: Student in an Organized Health Care Education/Training Program

## 2018-03-02 DIAGNOSIS — E119 Type 2 diabetes mellitus without complications: Secondary | ICD-10-CM | POA: Insufficient documentation

## 2018-03-02 DIAGNOSIS — Z8249 Family history of ischemic heart disease and other diseases of the circulatory system: Secondary | ICD-10-CM | POA: Diagnosis not present

## 2018-03-02 DIAGNOSIS — R600 Localized edema: Secondary | ICD-10-CM | POA: Diagnosis not present

## 2018-03-02 DIAGNOSIS — Z720 Tobacco use: Secondary | ICD-10-CM | POA: Diagnosis not present

## 2018-03-02 DIAGNOSIS — I509 Heart failure, unspecified: Secondary | ICD-10-CM | POA: Diagnosis not present

## 2018-03-02 DIAGNOSIS — I340 Nonrheumatic mitral (valve) insufficiency: Secondary | ICD-10-CM | POA: Insufficient documentation

## 2018-03-02 DIAGNOSIS — E785 Hyperlipidemia, unspecified: Secondary | ICD-10-CM | POA: Diagnosis not present

## 2018-03-02 DIAGNOSIS — I110 Hypertensive heart disease with heart failure: Secondary | ICD-10-CM | POA: Diagnosis not present

## 2018-03-03 NOTE — Addendum Note (Signed)
Addended by: Gildardo Griffes on: 03/03/2018 04:48 PM   Modules accepted: Orders

## 2018-03-04 MED ORDER — PREGABALIN 75 MG PO CAPS
ORAL_CAPSULE | ORAL | 5 refills | Status: DC
Start: 2018-03-04 — End: 2018-08-25

## 2018-03-09 ENCOUNTER — Telehealth: Payer: Self-pay | Admitting: Internal Medicine

## 2018-03-09 DIAGNOSIS — R600 Localized edema: Secondary | ICD-10-CM

## 2018-03-09 MED ORDER — FUROSEMIDE 20 MG PO TABS: 20.0000 mg | ORAL_TABLET | Freq: Two times a day (BID) | ORAL | 0 refills | Status: DC

## 2018-03-09 NOTE — Telephone Encounter (Signed)
Spoke with pt on her home phone number about her echo result and lab results. Expressed that I would like to see her sooner. Patient states she had another appointment and had to reschedule for end of August. Let her know that I will put in refill for her Lasix to tide her over until her next visit. Patient expressed understanding.

## 2018-03-16 ENCOUNTER — Encounter: Payer: Self-pay | Admitting: Physical Medicine & Rehabilitation

## 2018-03-16 ENCOUNTER — Encounter: Payer: Medicare Other | Admitting: Internal Medicine

## 2018-03-16 ENCOUNTER — Encounter: Payer: Medicare Other | Attending: Physical Medicine & Rehabilitation

## 2018-03-16 ENCOUNTER — Ambulatory Visit (HOSPITAL_BASED_OUTPATIENT_CLINIC_OR_DEPARTMENT_OTHER): Payer: Medicare Other | Admitting: Physical Medicine & Rehabilitation

## 2018-03-16 VITALS — BP 138/85 | HR 98 | Ht 63.0 in | Wt 227.0 lb

## 2018-03-16 DIAGNOSIS — F17210 Nicotine dependence, cigarettes, uncomplicated: Secondary | ICD-10-CM | POA: Insufficient documentation

## 2018-03-16 DIAGNOSIS — I509 Heart failure, unspecified: Secondary | ICD-10-CM | POA: Insufficient documentation

## 2018-03-16 DIAGNOSIS — E119 Type 2 diabetes mellitus without complications: Secondary | ICD-10-CM | POA: Diagnosis not present

## 2018-03-16 DIAGNOSIS — M1990 Unspecified osteoarthritis, unspecified site: Secondary | ICD-10-CM | POA: Insufficient documentation

## 2018-03-16 DIAGNOSIS — E785 Hyperlipidemia, unspecified: Secondary | ICD-10-CM | POA: Diagnosis not present

## 2018-03-16 DIAGNOSIS — I10 Essential (primary) hypertension: Secondary | ICD-10-CM | POA: Insufficient documentation

## 2018-03-16 DIAGNOSIS — M62838 Other muscle spasm: Secondary | ICD-10-CM

## 2018-03-16 DIAGNOSIS — G8250 Quadriplegia, unspecified: Secondary | ICD-10-CM | POA: Insufficient documentation

## 2018-03-16 DIAGNOSIS — Z9889 Other specified postprocedural states: Secondary | ICD-10-CM | POA: Insufficient documentation

## 2018-03-16 DIAGNOSIS — K219 Gastro-esophageal reflux disease without esophagitis: Secondary | ICD-10-CM | POA: Diagnosis not present

## 2018-03-16 DIAGNOSIS — R51 Headache: Secondary | ICD-10-CM | POA: Insufficient documentation

## 2018-03-16 NOTE — Patient Instructions (Signed)
You received a Dysport injection today. You may experience muscle pains and aches. He may apply ice 20 minutes every 2 hours as needed for the next 24-48 hours. He also noticed bleeding or bruising in the areas that were injected. May apply Band-Aid. If this bruising is extensive, please notify our office. If there is evidence of increasing redness that occurs 2-3 days after injection. Please call our office. This could be a sign of infection. It is very rare, however. You may experience some muscle weakness in the muscles and injected. This would likely start in about one week.  

## 2018-03-16 NOTE — Progress Notes (Signed)
Dysport Injection for spasticity using needle EMG guidance  Dilution: 200 Units/ml Indication: Severe spasticity which interferes with ADL,mobility and/or  hygiene and is unresponsive to medication management and other conservative care Informed consent was obtained after describing risks and benefits of the procedure with the patient. This includes bleeding, bruising, infection, excessive weakness, or medication side effects. A REMS form is on file and signed. Needle:  needle electrode Number of units per muscle  FDS 200 FCR, 100  AddPoll 50 Lumbricals 150  All injections were done after obtaining appropriate EMG activity and after negative drawback for blood. The patient tolerated the procedure well. Post procedure instructions were given. A followup appointment was made.   Based on EMG activity would consider reducing dose to 300 U next injection cycle Would not inject FCR and reduce FDS to 100U , will check clinical effect in 6 wks

## 2018-03-24 ENCOUNTER — Ambulatory Visit (INDEPENDENT_AMBULATORY_CARE_PROVIDER_SITE_OTHER): Payer: Medicare Other | Admitting: Podiatry

## 2018-03-24 ENCOUNTER — Encounter: Payer: Self-pay | Admitting: Podiatry

## 2018-03-24 DIAGNOSIS — M79671 Pain in right foot: Secondary | ICD-10-CM

## 2018-03-24 DIAGNOSIS — M79672 Pain in left foot: Secondary | ICD-10-CM

## 2018-03-24 DIAGNOSIS — B351 Tinea unguium: Secondary | ICD-10-CM

## 2018-03-24 NOTE — Patient Instructions (Signed)
Seen for hypertrophic nails. Noted of Fungal skin infection with oozing skin. All nails debrided. Foot soak prescribed. Return in 3 months or as needed.

## 2018-03-24 NOTE — Progress Notes (Signed)
Subjective: 62 y.o. year old female patient presents complaining of painful nails. Patient requests toe nails trimmed. Patient walks with walker and accompanied by her son. Stated that she stopped taking fluid pills because they are not helping. Patient reported not on medication since her blood sugar is under control but takes Lyrica for Pepheral neuropathy.  HPI: Positive history of bulging disc in neck and causing problem in shoulder and hand on left side allowing limited mobility.  Had disc surgery in October 2016. CHF in 1994, controlled with Digoxin and Vasotec.   Objective: Dermatologic: Thick yellow deformed nails with fungal debris 1-5 bilateral.  Hyperpigmented medial ankle right. Severe dry scaly skin with oozing clear fluid from medial ankle right. Vascular: Pedal pulses are not palpable. Severe lower limb edema R>L with oozing fluid through fungal infected skin. Orthopedic: No gross deformities. Neurologic: Hyperalgic foot bilateral.  Assessment: Dystrophic mycotic nails x 10. Tinea pedis with oozing skin right medial ankle. Peripheral neuropathy.  Treatment: All mycotic nails debrided.  Reviewed benefit of antifungal foot soak. Prescribed foot soak for oozing Tinea pedis. Return in 3 months or sooner if needed.

## 2018-03-30 ENCOUNTER — Other Ambulatory Visit: Payer: Self-pay

## 2018-03-30 ENCOUNTER — Encounter: Payer: Self-pay | Admitting: Internal Medicine

## 2018-03-30 ENCOUNTER — Ambulatory Visit (INDEPENDENT_AMBULATORY_CARE_PROVIDER_SITE_OTHER): Payer: Medicare Other | Admitting: Internal Medicine

## 2018-03-30 VITALS — BP 133/74 | HR 88 | Temp 98.0°F | Ht 63.0 in | Wt 227.7 lb

## 2018-03-30 DIAGNOSIS — Z79899 Other long term (current) drug therapy: Secondary | ICD-10-CM

## 2018-03-30 DIAGNOSIS — Z981 Arthrodesis status: Secondary | ICD-10-CM

## 2018-03-30 DIAGNOSIS — R0989 Other specified symptoms and signs involving the circulatory and respiratory systems: Secondary | ICD-10-CM

## 2018-03-30 DIAGNOSIS — I5020 Unspecified systolic (congestive) heart failure: Secondary | ICD-10-CM | POA: Diagnosis not present

## 2018-03-30 DIAGNOSIS — I10 Essential (primary) hypertension: Secondary | ICD-10-CM

## 2018-03-30 DIAGNOSIS — G959 Disease of spinal cord, unspecified: Secondary | ICD-10-CM | POA: Diagnosis not present

## 2018-03-30 DIAGNOSIS — I5040 Unspecified combined systolic (congestive) and diastolic (congestive) heart failure: Secondary | ICD-10-CM | POA: Insufficient documentation

## 2018-03-30 DIAGNOSIS — I110 Hypertensive heart disease with heart failure: Secondary | ICD-10-CM | POA: Diagnosis not present

## 2018-03-30 DIAGNOSIS — M21932 Unspecified acquired deformity of left forearm: Secondary | ICD-10-CM

## 2018-03-30 MED ORDER — ENALAPRIL MALEATE 2.5 MG PO TABS: 2.5000 mg | ORAL_TABLET | Freq: Two times a day (BID) | ORAL | 3 refills | Status: DC

## 2018-03-30 MED ORDER — FUROSEMIDE 40 MG PO TABS: 40.0000 mg | ORAL_TABLET | Freq: Two times a day (BID) | ORAL | 2 refills | Status: DC

## 2018-03-30 MED ORDER — METOPROLOL SUCCINATE ER 25 MG PO TB24: 25.0000 mg | ORAL_TABLET | Freq: Every day | ORAL | 2 refills | Status: DC

## 2018-03-30 NOTE — Progress Notes (Signed)
CC: Lower extremity edema   HPI: [name] is a 62 y.o. F w/ PMH of CHF, HTn, Cervical myelopathy s/p cervical arthrodesis in 2016 presenting to the clinic with her son for a follow up visit for management of heart failure. She came to the clinic last month for an annual follow up with complaints of dyspnea, orthopnea and increasing lower extremity swelling. We had ordered an echo and put her on 20mg  BID Lasix. Her Echo showed a new EF of 30-35% compared to her prior in 2007 w/ 60-65%. She states when she took the Lasix, her urination actually decreased so she stopped taking it. She continues to endorse dyspnea and 2 pillow orthopnea. She states in the past she was on vasotec for her blood pressure and heart. Denies any chest pain, palpitations, wheezing, cough, fever.  Past Medical History:  Diagnosis Date  . CHF (congestive heart failure) ([place])   . Chronic headaches   . DJD (degenerative joint disease)   . DM (diabetes mellitus) ([place]) 2008   stable HgBA1C at 6.5  . GERD (gastroesophageal reflux disease)    well controlled on Omeprazole  . HLD (hyperlipidemia) 2008   stable, well controlled  . Hypertension    well controlled   Review of Systems: Review of Systems  Constitutional: Negative for chills, fever and malaise/fatigue.  Respiratory: Positive for shortness of breath. Negative for cough, sputum production and wheezing.   Cardiovascular: Positive for orthopnea and leg swelling. Negative for chest pain, palpitations and claudication.  Neurological: Negative for dizziness, sensory change, weakness and headaches.    Physical Exam: Vitals:   03/30/18 1427  BP: 133/74  Pulse: 88  Temp: 98 F (36.7 C)  TempSrc: Oral  Weight: 227 lb 11.2 oz (103.3 kg)  Height: 5\' 3"  (1.6 m)    Physical Exam  Constitutional: She is oriented to person, place, and time. She appears well-developed and well-nourished. No distress.  HENT:  Head: Normocephalic and atraumatic.  Mouth/Throat:  Oropharynx is clear and moist. No oropharyngeal exudate.  Neck: Normal range of motion. Neck supple.  Wide neck circumference  Cardiovascular: Normal rate, regular rhythm, normal heart sounds and intact distal pulses.  Respiratory: Effort normal. No respiratory distress. She has no wheezes. She has rales (bilateral rales up to mid lung).  GI: Soft. Bowel sounds are normal. She exhibits no distension. There is no tenderness. There is no guarding.  Musculoskeletal: Normal range of motion. She exhibits edema (3+ pitting edema up to the knees) and deformity (Left sided hand, wrist, and arm spasticity). She exhibits no tenderness.  Neurological: She is alert and oriented to person, place, and time. She has normal reflexes. No cranial nerve deficit.  Skin: Skin is warm and dry. She is not diaphoretic.  Psychiatric: She has a normal mood and affect. Her behavior is normal. Judgment and thought content normal.     Assessment & Plan:   Systolic heart failure ([place]) Presents with dyspena, orthopnea, lower extremity edema. Not on medications. Recent echo show significantly reduced EF. Has hx of prior CHF resolved after losing 70 pounds. Will need to restart medication therapy and reconnect with cardiology - Increase Lasix to 40mg  Bid - Start Toprol-XL 25mg  - Start Vasotec 2.5mg  BID - Referral for cardiology - lipid panel - TSH - Iron panel - CBC  Essential hypertension BP Readings from Last 3 Encounters:  03/30/18 133/74  03/16/18 138/85  02/26/18 132/80   Presents with elevated blood pressure. Will need to start antihypertensives in setting  of HFrEF. Creatinine 0.6 - Start enalapril 2.5mg  BID, Metropolo succinate 25mg  daily, Furosemide 40mg  BID     Patient seen with Dr. Rebeca Alert   -Gilberto Better, PGY1

## 2018-03-30 NOTE — Patient Instructions (Signed)
Julie Elliott  Thank you for coming to the clinic. Here are the recommendations based on what we discussed today:  START TOPROL-XL (metoprolol succinate) 25mg  daily START Lasix 40mg  daily START Vasotec 2.5mg  twice a day  We will start a referral for cardiology  Thank you for choosing [place] stands for "Dietary Approaches to Stop Hypertension." The DASH eating plan is a healthy eating plan that has been shown to reduce high blood pressure (hypertension). It may also reduce your risk for type 2 diabetes, heart disease, and stroke. The DASH eating plan may also help with weight loss. What are tips for following this plan? General guidelines  Avoid eating more than 2,300 mg (milligrams) of salt (sodium) a day. If you have hypertension, you may need to reduce your sodium intake to 1,500 mg a day.  Limit alcohol intake to no more than 1 drink a day for nonpregnant women and 2 drinks a day for men. One drink equals 12 oz of beer, 5 oz of wine, or 1 oz of hard liquor.  Work with your health care provider to maintain a healthy body weight or to lose weight. Ask what an ideal weight is for you.  Get at least 30 minutes of exercise that causes your heart to beat faster (aerobic exercise) most days of the week. Activities may include walking, swimming, or biking.  Work with your health care provider or diet and nutrition specialist (dietitian) to adjust your eating plan to your individual calorie needs. Reading food labels  Check food labels for the amount of sodium per serving. Choose foods with less than 5 percent of the Daily Value of sodium. Generally, foods with less than 300 mg of sodium per serving fit into this eating plan.  To find whole grains, look for the word "whole" as the first word in the ingredient list. Shopping  Buy products labeled as "low-sodium" or "no salt added."  Buy fresh foods. Avoid canned foods and premade or frozen  meals. Cooking  Avoid adding salt when cooking. Use salt-free seasonings or herbs instead of table salt or sea salt. Check with your health care provider or pharmacist before using salt substitutes.  Do not fry foods. Cook foods using healthy methods such as baking, boiling, grilling, and broiling instead.  Cook with heart-healthy oils, such as olive, canola, soybean, or sunflower oil. Meal planning   Eat a balanced diet that includes: ? 5 or more servings of fruits and vegetables each day. At each meal, try to fill half of your plate with fruits and vegetables. ? Up to 6-8 servings of whole grains each day. ? Less than 6 oz of lean meat, poultry, or fish each day. A 3-oz serving of meat is about the same size as a deck of cards. One egg equals 1 oz. ? 2 servings of low-fat dairy each day. ? A serving of nuts, seeds, or beans 5 times each week. ? Heart-healthy fats. Healthy fats called Omega-3 fatty acids are found in foods such as flaxseeds and coldwater fish, like sardines, salmon, and mackerel.  Limit how much you eat of the following: ? Canned or prepackaged foods. ? Food that is high in trans fat, such as fried foods. ? Food that is high in saturated fat, such as fatty meat. ? Sweets, desserts, sugary drinks, and other foods with added sugar. ? Full-fat dairy products.  Do not salt foods before eating.  Try to eat at least 2 vegetarian  meals each week.  Eat more home-cooked food and less restaurant, buffet, and fast food.  When eating at a restaurant, ask that your food be prepared with less salt or no salt, if possible. What foods are recommended? The items listed may not be a complete list. Talk with your dietitian about what dietary choices are best for you. Grains Whole-grain or whole-wheat bread. Whole-grain or whole-wheat pasta. Brown rice. [place]. Bulgur. Whole-grain and low-sodium cereals. Pita bread. Low-fat, low-sodium crackers. Whole-wheat flour  tortillas. Vegetables Fresh or frozen vegetables (raw, steamed, roasted, or grilled). Low-sodium or reduced-sodium tomato and vegetable juice. Low-sodium or reduced-sodium tomato sauce and tomato paste. Low-sodium or reduced-sodium canned vegetables. Fruits All fresh, dried, or frozen fruit. Canned fruit in natural juice (without added sugar). Meat and other protein foods Skinless chicken or [place]. Ground chicken or [place]. Pork with fat trimmed off. Fish and seafood. Egg whites. Dried beans, peas, or lentils. Unsalted nuts, nut butters, and seeds. Unsalted canned beans. Lean cuts of beef with fat trimmed off. Low-sodium, lean deli meat. Dairy Low-fat (1%) or fat-free (skim) milk. Fat-free, low-fat, or reduced-fat cheeses. Nonfat, low-sodium ricotta or cottage cheese. Low-fat or nonfat yogurt. Low-fat, low-sodium cheese. Fats and oils Soft margarine without trans fats. Vegetable oil. Low-fat, reduced-fat, or light mayonnaise and salad dressings (reduced-sodium). Canola, safflower, olive, soybean, and sunflower oils. Avocado. Seasoning and other foods Herbs. Spices. Seasoning mixes without salt. Unsalted popcorn and pretzels. Fat-free sweets. What foods are not recommended? The items listed may not be a complete list. Talk with your dietitian about what dietary choices are best for you. Grains Baked goods made with fat, such as croissants, muffins, or some breads. Dry pasta or rice meal packs. Vegetables Creamed or fried vegetables. Vegetables in a cheese sauce. Regular canned vegetables (not low-sodium or reduced-sodium). Regular canned tomato sauce and paste (not low-sodium or reduced-sodium). Regular tomato and vegetable juice (not low-sodium or reduced-sodium). Julie Elliott. Olives. Fruits Canned fruit in a light or heavy syrup. Fried fruit. Fruit in cream or butter sauce. Meat and other protein foods Fatty cuts of meat. Ribs. Fried meat. Julie Elliott. Sausage. Bologna and other processed lunch meats.  Salami. Fatback. Hotdogs. Bratwurst. Salted nuts and seeds. Canned beans with added salt. Canned or smoked fish. Whole eggs or egg yolks. Chicken or [place] with skin. Dairy Whole or 2% milk, cream, and half-and-half. Whole or full-fat cream cheese. Whole-fat or sweetened yogurt. Full-fat cheese. Nondairy creamers. Whipped toppings. Processed cheese and cheese spreads. Fats and oils Butter. Stick margarine. Lard. Shortening. Ghee. Bacon fat. Tropical oils, such as coconut, palm kernel, or palm oil. Seasoning and other foods Salted popcorn and pretzels. Onion salt, garlic salt, seasoned salt, table salt, and sea salt. Worcestershire sauce. Tartar sauce. Barbecue sauce. Teriyaki sauce. Soy sauce, including reduced-sodium. Steak sauce. Canned and packaged gravies. Fish sauce. Oyster sauce. Cocktail sauce. Horseradish that you find on the shelf. Ketchup. Mustard. Meat flavorings and tenderizers. Bouillon cubes. Hot sauce and Tabasco sauce. Premade or packaged marinades. Premade or packaged taco seasonings. Relishes. Regular salad dressings. Where to find more information:  National Heart, Lung, and [place]: https://wilson-eaton.com/  American Heart Association: www.heart.org Summary  The DASH eating plan is a healthy eating plan that has been shown to reduce high blood pressure (hypertension). It may also reduce your risk for type 2 diabetes, heart disease, and stroke.  With the DASH eating plan, you should limit salt (sodium) intake to 2,300 mg a day. If you have hypertension, you may need to  reduce your sodium intake to 1,500 mg a day.  When on the DASH eating plan, aim to eat more fresh fruits and vegetables, whole grains, lean proteins, low-fat dairy, and heart-healthy fats.  Work with your health care provider or diet and nutrition specialist (dietitian) to adjust your eating plan to your individual calorie needs. This information is not intended to replace advice given to you by your health  care provider. Make sure you discuss any questions you have with your health care provider. Document Released: 07/10/2011 Document Revised: 07/14/2016 Document Reviewed: 07/14/2016 Elsevier Interactive Patient Education  [employer].

## 2018-03-31 ENCOUNTER — Encounter: Payer: Self-pay | Admitting: Internal Medicine

## 2018-03-31 LAB — LIPID PANEL
Chol/HDL Ratio: 3.7 ratio (ref 0.0–4.4)
Cholesterol, Total: 158 mg/dL (ref 100–199)
HDL: 43 mg/dL (ref >39)
LDL Calculated: 100 mg/dL — ABNORMAL HIGH (ref 0–99)
Triglycerides: 75 mg/dL (ref 0–149)
VLDL Cholesterol Cal: 15 mg/dL (ref 5–40)

## 2018-03-31 LAB — CBC WITH DIFFERENTIAL/PLATELET
Basophils Absolute: 0.1 10*3/uL (ref 0.0–0.2)
Basos: 1 %
EOS (ABSOLUTE): 0.1 10*3/uL (ref 0.0–0.4)
Eos: 3 %
Hematocrit: 41.5 % (ref 34.0–46.6)
Hemoglobin: 14.2 g/dL (ref 11.1–15.9)
Immature Grans (Abs): 0 10*3/uL (ref 0.0–0.1)
Immature Granulocytes: 0 %
Lymphocytes Absolute: 1.9 10*3/uL (ref 0.7–3.1)
Lymphs: 42 %
MCH: 29.7 pg (ref 26.6–33.0)
MCHC: 34.2 g/dL (ref 31.5–35.7)
MCV: 87 fL (ref 79–97)
Monocytes Absolute: 0.3 10*3/uL (ref 0.1–0.9)
Monocytes: 6 %
Neutrophils Absolute: 2.1 10*3/uL (ref 1.4–7.0)
Neutrophils: 48 %
Platelets: 217 10*3/uL (ref 150–450)
RBC: 4.78 x10E6/uL (ref 3.77–5.28)
RDW: 13.5 % (ref 12.3–15.4)
WBC: 4.5 10*3/uL (ref 3.4–10.8)

## 2018-03-31 LAB — IRON AND TIBC
Iron Saturation: 18 % (ref 15–55)
Iron: 55 ug/dL (ref 27–159)
Total Iron Binding Capacity: 304 ug/dL (ref 250–450)
UIBC: 249 ug/dL (ref 131–425)

## 2018-03-31 LAB — FERRITIN: Ferritin: 64 ng/mL (ref 15–150)

## 2018-03-31 LAB — TSH: TSH: 1.38 u[IU]/mL (ref 0.450–4.500)

## 2018-03-31 NOTE — Assessment & Plan Note (Signed)
Presents with dyspena, orthopnea, lower extremity edema. Not on medications. Recent echo show significantly reduced EF. Has hx of prior CHF resolved after losing 70 pounds. Will need to restart medication therapy and reconnect with cardiology - Increase Lasix to 40mg  Bid - Start Toprol-XL 25mg  - Start Vasotec 2.5mg  BID - Referral for cardiology - lipid panel - TSH - Iron panel - CBC

## 2018-03-31 NOTE — Assessment & Plan Note (Addendum)
BP Readings from Last 3 Encounters:  03/30/18 133/74  03/16/18 138/85  02/26/18 132/80   Presents with elevated blood pressure. Will need to start antihypertensives in setting of HFrEF. Creatinine 0.6 - Start enalapril 2.5mg  BID, Metropolo succinate 25mg  daily, Furosemide 40mg  BID

## 2018-04-01 NOTE — Progress Notes (Signed)
Internal Medicine Clinic Attending  I saw and evaluated the patient.  I personally confirmed the key portions of the history and exam documented by Dr. Truman Hayward and I reviewed pertinent patient test results.  The assessment, diagnosis, and plan were formulated together and I agree with the documentation in the resident's note.  Here for follow up of dyspnea, orthopnea, leg swelling. TTE showed LVEF 30-35%. Clinical picture consistent with HFrEF. Evidently, she previously had severe HF and reports being listed for transplant 20 years ago, but it "got better". Unclear etiology of current HFrEF, TTE with no significant valvular dz, LV with diffuse hypokinesis. TSH normal, no known family history. Given personal history, will refer to cardiology for further evaluation, consideration of cMRI and possible ischemic evaluation.  In the meantime, will start enalapril (her son reported this "worked wonders for her" in the past) and metoprolol, increase dose of furosemide to try to achieve better diuresis. Would be a candidate for ARNI instead of ACE-I.   Ferritin is <100, iron sat <20%, per guidelines and FAIR-HF trial, would benefit from IV iron. Will try to arrange at follow up.  Lenice Pressman, M.D., Ph.D.

## 2018-04-22 ENCOUNTER — Emergency Department: Admit: 2018-04-22 | Payer: PRIVATE HEALTH INSURANCE | Primary: Registered Nurse

## 2018-04-22 ENCOUNTER — Inpatient Hospital Stay
Admit: 2018-04-22 | Discharge: 2018-04-22 | Disposition: A | Discharge status: Home / Self Care | Payer: PRIVATE HEALTH INSURANCE | Attending: Emergency Medicine

## 2018-04-22 DIAGNOSIS — T148XXA Other injury of unspecified body region, initial encounter: Secondary | ICD-10-CM

## 2018-04-22 LAB — CBC WITH AUTOMATED DIFF
ABS. BASOPHILS: 0 10*3/uL (ref 0.0–0.1)
ABS. EOSINOPHILS: 0.2 10*3/uL (ref 0.0–0.4)
ABS. LYMPHOCYTES: 1.2 10*3/uL (ref 0.9–3.6)
ABS. MONOCYTES: 0.4 10*3/uL (ref 0.05–1.2)
ABS. NEUTROPHILS: 5.1 10*3/uL (ref 1.8–8.0)
BASOPHILS: 0 % (ref 0–2)
EOSINOPHILS: 2 % (ref 0–5)
HCT: 37.3 % (ref 35.0–45.0)
HGB: 12.3 g/dL (ref 12.0–16.0)
LYMPHOCYTES: 18 % — ABNORMAL LOW (ref 21–52)
MCH: 26.1 PG (ref 24.0–34.0)
MCHC: 33 g/dL (ref 31.0–37.0)
MCV: 79.2 FL (ref 74.0–97.0)
MONOCYTES: 5 % (ref 3–10)
MPV: 10.3 FL (ref 9.2–11.8)
NEUTROPHILS: 75 % — ABNORMAL HIGH (ref 40–73)
PLATELET: 239 10*3/uL (ref 135–420)
RBC: 4.71 M/uL (ref 4.20–5.30)
RDW: 14.1 % (ref 11.6–14.5)
WBC: 6.8 10*3/uL (ref 4.6–13.2)

## 2018-04-22 LAB — METABOLIC PANEL, COMPREHENSIVE
A-G Ratio: 1.1 (ref 0.8–1.7)
ALT (SGPT): 43 U/L (ref 13–56)
AST (SGOT): 21 U/L (ref 10–38)
Albumin: 3.8 g/dL (ref 3.4–5.0)
Alk. phosphatase: 115 U/L (ref 45–117)
Anion gap: 1 mmol/L — ABNORMAL LOW (ref 3.0–18)
BUN/Creatinine ratio: 26 — ABNORMAL HIGH (ref 12–20)
BUN: 19 MG/DL — ABNORMAL HIGH (ref 7.0–18)
Bilirubin, total: 0.3 MG/DL (ref 0.2–1.0)
CO2: 31 mmol/L (ref 21–32)
Calcium: 10.6 MG/DL — ABNORMAL HIGH (ref 8.5–10.1)
Chloride: 108 mmol/L (ref 100–111)
Creatinine: 0.72 MG/DL (ref 0.6–1.3)
GFR est AA: >60 mL/min/{1.73_m2} (ref >60)
GFR est non-AA: >60 mL/min/{1.73_m2} (ref >60)
Globulin: 3.4 g/dL (ref 2.0–4.0)
Glucose: 134 mg/dL — ABNORMAL HIGH (ref 74–99)
Potassium: 4.4 mmol/L (ref 3.5–5.5)
Protein, total: 7.2 g/dL (ref 6.4–8.2)
Sodium: 140 mmol/L (ref 136–145)

## 2018-04-22 LAB — URINALYSIS W/ RFLX MICROSCOPIC
Bilirubin, Urine: NEGATIVE
Bilirubin: NEGATIVE
Glucose, Ur: NEGATIVE mg/dL
Glucose: NEGATIVE mg/dL
Ketone: NEGATIVE mg/dL
Ketones, Urine: NEGATIVE mg/dL
Leukocyte Esterase, Urine: NEGATIVE
Leukocyte Esterase: NEGATIVE
Nitrite, Urine: NEGATIVE
Nitrites: NEGATIVE
Protein, UA: NEGATIVE mg/dL
Protein: NEGATIVE mg/dL
Specific Gravity, UA: 1.01 (ref 1.003–1.030)
Specific gravity: 1.01 (ref 1.003–1.030)
Urobilinogen, UA, POCT: 0.2 EU/dL (ref 0.2–1.0)
Urobilinogen: 0.2 EU/dL (ref 0.2–1.0)
pH (UA): 6 (ref 5.0–8.0)
pH, UA: 6 (ref 5.0–8.0)

## 2018-04-22 LAB — CARDIAC PANEL,(CK, CKMB & TROPONIN)
CK - MB: 3.4 ng/ml (ref <3.6)
CK-MB Index: 2.7 % (ref 0.0–4.0)
CK: 124 U/L (ref 26–192)
Troponin-I, QT: <0.02 NG/ML (ref 0.0–0.045)

## 2018-04-22 LAB — URINE MICROSCOPIC ONLY
RBC, UA: 0 /HPF (ref 0–5)
RBC: 0 /hpf (ref 0–5)
WBC, UA: NEGATIVE /HPF (ref 0–4)
WBC: NEGATIVE /hpf (ref 0–4)

## 2018-04-22 LAB — LIPASE
Lipase: 180 U/L (ref 73–393)
Lipase: 180 U/L (ref 73–393)

## 2018-04-22 LAB — CBC WITH AUTO DIFFERENTIAL
Basophils %: 0 % (ref 0–2)
Basophils Absolute: 0 10*3/uL (ref 0.0–0.1)
Eosinophils %: 2 % (ref 0–5)
Eosinophils Absolute: 0.2 10*3/uL (ref 0.0–0.4)
Hematocrit: 37.3 % (ref 35.0–45.0)
Hemoglobin: 12.3 g/dL (ref 12.0–16.0)
Lymphocytes %: 18 % — ABNORMAL LOW (ref 21–52)
Lymphocytes Absolute: 1.2 10*3/uL (ref 0.9–3.6)
MCH: 26.1 pg (ref 24.0–34.0)
MCHC: 33 g/dL (ref 31.0–37.0)
MCV: 79.2 FL (ref 74.0–97.0)
MPV: 10.3 FL (ref 9.2–11.8)
Monocytes %: 5 % (ref 3–10)
Monocytes Absolute: 0.4 10*3/uL (ref 0.05–1.2)
Neutrophils %: 75 % — ABNORMAL HIGH (ref 40–73)
Neutrophils Absolute: 5.1 10*3/uL (ref 1.8–8.0)
Platelets: 239 10*3/uL (ref 135–420)
RBC: 4.71 M/uL (ref 4.20–5.30)
RDW: 14.1 % (ref 11.6–14.5)
WBC: 6.8 10*3/uL (ref 4.6–13.2)

## 2018-04-22 LAB — COMPREHENSIVE METABOLIC PANEL
ALT: 43 U/L (ref 13–56)
AST: 21 U/L (ref 10–38)
Albumin/Globulin Ratio: 1.1 (ref 0.8–1.7)
Albumin: 3.8 g/dL (ref 3.4–5.0)
Alkaline Phosphatase: 115 U/L (ref 45–117)
Anion Gap: 1 mmol/L — ABNORMAL LOW (ref 3.0–18)
BUN/Creatinine Ratio: 26 — ABNORMAL HIGH (ref 12–20)
BUN: 19 mg/dL — ABNORMAL HIGH (ref 7.0–18)
CO2: 31 mmol/L (ref 21–32)
Calcium: 10.6 mg/dL — ABNORMAL HIGH (ref 8.5–10.1)
Chloride: 108 mmol/L (ref 100–111)
Creatinine: 0.72 mg/dL (ref 0.6–1.3)
GFR African American: >60 mL/min/{1.73_m2} (ref >60)
Globulin: 3.4 g/dL (ref 2.0–4.0)
Glucose: 134 mg/dL — ABNORMAL HIGH (ref 74–99)
Potassium: 4.4 mmol/L (ref 3.5–5.5)
Sodium: 140 mmol/L (ref 136–145)
Total Bilirubin: 0.3 mg/dL (ref 0.2–1.0)
Total Protein: 7.2 g/dL (ref 6.4–8.2)
eGFR NON-AA: >60 mL/min/{1.73_m2} (ref >60)

## 2018-04-22 LAB — CARDIAC PANEL
CK-MB Index: 2.7 % (ref 0.0–4.0)
CK-MB: 3.4 ng/mL (ref <3.6)
Total CK: 124 U/L (ref 26–192)
Troponin I: <0.02 ng/mL (ref 0.0–0.045)

## 2018-04-22 MED ORDER — ACETAMINOPHEN-CODEINE 300 MG-30 MG TAB
300-30 mg | ORAL_TABLET | ORAL | 0 refills | Status: AC | PRN
Start: 2018-04-22 — End: 2018-04-25

## 2018-04-22 NOTE — ED Provider Notes (Signed)
ED Provider Notes by Earl Gala, PA-C at 04/22/18 0981                Author: Earl Gala, PA-C  Service: EMERGENCY  Author Type: Physician Assistant       Filed: 04/24/18 1148  Date of Service: 04/22/18 0811  Status: Attested           Editor: Garth Schlatter (Physician Assistant)  Cosigner: Wendie Chess, MD at 04/25/18 2035          Attestation signed by Wendie Chess, MD at 04/25/18 2035          I was personally available for consultation in the emergency department. Wendie Chess, MD                                 EMERGENCY DEPARTMENT HISTORY AND PHYSICAL EXAM      Date: 04/22/2018   Patient Name: [name] Short        History of Presenting Illness          Chief Complaint       Patient presents with        ?  Back Pain              History Provided By: Patient            Additional History (Context): Savhanna Sliva  is a 62 y.o. female with h/o HTN,  DM, arthritis, Hyperlipidemtia, stroke, anemia who presents with back pain for days. States it is located on both side of middle and lower back, worse with movements. Pt states she did move a table last week so not sure if this caused pain. No other trauma  or injury. H/o similar back pain but usually in lower back. Red flags for low back pain including history of HIV, unexplained weight loss, unexplained fevers, inability control bowel or bladder, prolonged steroid use, IV drug use, age greater than 44,  history of cancer were all addressed and all are negative.   Pt denies any fevers or chills, headache, dizziness or light headedness, ENT issues, CP or discomfort, SOB, cough, diaphoresis, melena/hematochezia, dysuria, hematuria, frequency, focal weakness/numbness/tingling, or rash.  Patient has no other complaints  at this time.         PCP: Marissa Nestle., MD        Current Outpatient Medications          Medication  Sig  Dispense  Refill           ?  ascorbic acid, vitamin C, (VITAMIN C) 250 mg tablet  Take 1 Tab by mouth two (2)  times daily (with meals).  60 Tab  0     ?  ferrous sulfate 325 mg (65 mg iron) tablet  Take 1 Tab by mouth two (2) times daily (with meals).  60 Tab  0     ?  pantoprazole (PROTONIX) 40 mg tablet  Take 1 Tab by mouth daily.  30 Tab  0     ?  multivitamin (ONE A DAY) tablet  Take 1 Tab by mouth daily.         ?  metOLazone (ZAROXOLYN) 5 mg tablet  Take 2.5 mg by mouth Three (3) times a week.               ?  furosemide (LASIX) 20 mg tablet  Take 20  mg by mouth daily.               ?  EXENATIDE MICROSPHERES (BYDUREON SC)  by SubCUTAneous route every seven (7) days.         ?  cholecalciferol (VITAMIN D3) 1,000 unit tablet  Take 1 tablet by mouth daily.  30 tablet  0     ?  insulin aspart protamine/insulin aspart (NOVOLOG MIX 70-30 FLEXPEN) 100 unit/mL (70-30) flex pen  30 units SQ every morning and 25 units SQ every evening  1 Package  0     ?  atorvastatin (LIPITOR) 40 mg tablet  Take 1 tablet by mouth nightly.  30 tablet  0     ?  clopidogrel (PLAVIX) 75 mg tablet  Take 1 tablet by mouth daily.  30 tablet  2           ?  lisinopril (PRINIVIL, ZESTRIL) 5 mg tablet  Take 1 tablet by mouth daily.  30 tablet  0             Past History        Past Medical History:     Past Medical History:        Diagnosis  Date         ?  Anemia       ?  Arthritis       ?  Diabetes (HCC)       ?  History of colon polyps       ?  HTN (hypertension)       ?  Hyperlipemia           ?  Stroke [facility]             Past Surgical History:     Past Surgical History:         Procedure  Laterality  Date          ?  COLONOSCOPY  N/A  01/13/2017          COLONOSCOPY performed by Levan Hurst, MD at [facility]          ?  HX CESAREAN SECTION    1987     ?  HX COLONOSCOPY              ?  HX DILATION AND CURETTAGE    1995           Family History:     Family History         Problem  Relation  Age of Onset          ?  Hypertension  Other  35              parent,NOS          ?  Heart Disease  Other  62              parent, NOS          ?  Diabetes   Other  45              parent, NOS          ?  Cancer  Father             Social History:     Social History          Tobacco Use         ?  Smoking status:  Current Every Day Smoker        ?  Tobacco comment: patient state that she smokes 1 black&mild /daily       Substance Use Topics         ?  Alcohol use:  No         ?  Drug use:  No           Allergies:   No Known Allergies           Review of Systems     Review of Systems    Constitutional: Negative for chills and fever.    HENT: Negative.     Eyes: Negative.     Respiratory: Negative for cough and shortness of breath.     Cardiovascular: Negative for chest pain and palpitations.    Gastrointestinal: Negative for abdominal pain, constipation, diarrhea, nausea and vomiting.    Genitourinary: Negative.  Negative for difficulty urinating and flank pain.    Musculoskeletal: Positive for back pain. Negative for gait problem and neck pain.    Skin: Negative.     Allergic/Immunologic: Negative.     Neurological: Negative for dizziness, weakness, numbness and headaches.    Psychiatric/Behavioral: Negative.     All other systems reviewed and are negative.      All Other Systems Negative     Physical Exam     Nursing note and vitals reviewed.   CONSTITUTIONAL: Alert, in no apparent distress; well-developed; well-nourished.    HEAD:  Normocephalic, atraumatic.    RESP: Chest clear, equal breath sounds.    CV: S1 and S2 WNL; No murmurs, gallops or rubs.    GI: Abdomen soft and non-tender. No masses or organomegaly.    BACK: bilateral Muscles in lower and mid back TTP. FROM, 5/5 strength, 2+DTR's,no lumbar paraspinal tenderness no erythema, no edema, no ecchymosis,(-) deformity, swelling, skin changes, midline tenderness or crepitus. (-) step offs.  Neg SLR bilaterlly.  Full sensation to deep and light palpation bilaterally.  (-) foot drop   UPPER EXT:  Normal inspection.    LOWER EXT: Normal inspection   NEURO:  strength 5/5 and sym, sensation intact.    SKIN: No rashes;  Normal for age and stage.    PSYCH:  Alert and oriented, normal affect.              Diagnostic Study Results        Labs -    No results found for this or any previous visit (from the past 12 hour(s)).      Radiologic Studies -      XR CHEST PORT    (Results Pending)          CT Results  (Last 48 hours)          None                 CXR Results  (Last 48  hours)          None                       Medical Decision Making     I am the first provider for this patient.      I reviewed the vital signs, available nursing notes, past medical history, past surgical history, family history and social history.      Vital Signs-Reviewed the patient's vital signs.         Pulse Oximetry Analysis - 99% on RA      Records Reviewed: Nursing Notes, Old Medical Records,  Previous Radiology Studies and  Previous Laboratory Studies      Procedures:   Procedures      Provider Notes (Medical Decision Making):    62 yo F here with lower back pain. Pt presents ambulatory, moving BLE in NAD, well-hydrated, non-toxic in appearance, with normal vitals.  Exam including neuro exam normal. No red flags such as saddle paresthesias, weakness, bladder/bowel dysfunction,  IVDA, DM, or fever - very low suspicion for epidural abscess, cauda equina, or spinal cord injury.  No recent injury/trauma/heavy lifting.  Labs, EKG and work up negative. Exam and HPI consistent with lumbosacral radiculopathy/sciatica/myofascial strain/sprain.     Explained to patient that this pain may take a few weeks to completely resolve.  Will provide work note as necessary.        Progress Note/Consultations:       MED RECONCILIATION:     No current facility-administered medications for this encounter.           Current Outpatient Medications        Medication  Sig         ?  ascorbic acid, vitamin C, (VITAMIN C) 250 mg tablet  Take 1 Tab by mouth two (2) times daily (with meals).     ?  ferrous sulfate 325 mg (65 mg iron) tablet  Take 1 Tab by mouth two (2) times daily  (with meals).     ?  pantoprazole (PROTONIX) 40 mg tablet  Take 1 Tab by mouth daily.     ?  multivitamin (ONE A DAY) tablet  Take 1 Tab by mouth daily.     ?  metOLazone (ZAROXOLYN) 5 mg tablet  Take 2.5 mg by mouth Three (3) times a week.     ?  furosemide (LASIX) 20 mg tablet  Take 20 mg by mouth daily.     ?  EXENATIDE MICROSPHERES (BYDUREON SC)  by SubCUTAneous route every seven (7) days.     ?  cholecalciferol (VITAMIN D3) 1,000 unit tablet  Take 1 tablet by mouth daily.     ?  insulin aspart protamine/insulin aspart (NOVOLOG MIX 70-30 FLEXPEN) 100 unit/mL (70-30) flex pen  30 units SQ every morning and 25 units SQ every evening     ?  atorvastatin (LIPITOR) 40 mg tablet  Take 1 tablet by mouth nightly.     ?  clopidogrel (PLAVIX) 75 mg tablet  Take 1 tablet by mouth daily.         ?  lisinopril (PRINIVIL, ZESTRIL) 5 mg tablet  Take 1 tablet by mouth daily.           Disposition:   home      DISCHARGE NOTE:       Pt has been reexamined.  Patient has no new complaints, changes, or physical findings.  Care plan outlined and precautions discussed.  Results of work up, treatment plan and disposition were reviewed with the patient. All medications were reviewed with  the patient; including any prescriptions given. All of pt's questions and concerns were addressed. Patient was instructed and agrees to follow up with pcp , as well as to return to the ED upon further deterioration. Patient is ready to go home.        Follow-up Information      None                  Current Discharge Medication List  Diagnosis        Clinical Impression: back pain, muscle strain

## 2018-04-22 NOTE — ED Notes (Signed)
Pt c/o severe lower back pain on both sides made worse with movement, pt unable to walk without pain, pt states she moved a table on Tuesday but does not remember hurting herself, pt denies dysuria, states bowel movements are irregular but that is normal for her

## 2018-04-22 NOTE — ED Provider Notes (Signed)
EMERGENCY DEPARTMENT HISTORY AND PHYSICAL EXAM    Date: 04/22/2018  Patient Name: [name] Short    History of Presenting Illness     Chief Complaint   Patient presents with   ??? Back Pain         History Provided By: Patient        Additional History (Context): [name] Short is a 62 y.o. female with h/o HTN, DM, arthritis, Hyperlipidemtia, stroke, anemia who presents with back pain for days. States it is located on both side of middle and lower back, worse with movements. Pt states she did move a table last week so not sure if this caused pain. No other trauma or injury. H/o similar back pain but usually in lower back. Red flags for low back pain including history of HIV, unexplained weight loss, unexplained fevers, inability control bowel or bladder, prolonged steroid use, IV drug use, age greater than 79, history of cancer were all addressed and all are negative.  Pt denies any fevers or chills, headache, dizziness or light headedness, ENT issues, CP or discomfort, SOB, cough, diaphoresis, melena/hematochezia, dysuria, hematuria, frequency, focal weakness/numbness/tingling, or rash.  Patient has no other complaints at this time.      PCP: Marissa Nestle., MD    Current Outpatient Medications   Medication Sig Dispense Refill   ??? ascorbic acid, vitamin C, (VITAMIN C) 250 mg tablet Take 1 Tab by mouth two (2) times daily (with meals). 60 Tab 0   ??? ferrous sulfate 325 mg (65 mg iron) tablet Take 1 Tab by mouth two (2) times daily (with meals). 60 Tab 0   ??? pantoprazole (PROTONIX) 40 mg tablet Take 1 Tab by mouth daily. 30 Tab 0   ??? multivitamin (ONE A DAY) tablet Take 1 Tab by mouth daily.     ??? metOLazone (ZAROXOLYN) 5 mg tablet Take 2.5 mg by mouth Three (3) times a week.     ??? furosemide (LASIX) 20 mg tablet Take 20 mg by mouth daily.     ??? EXENATIDE MICROSPHERES (BYDUREON SC) by SubCUTAneous route every seven (7) days.     ??? cholecalciferol (VITAMIN D3) 1,000 unit tablet Take 1 tablet by mouth  daily. 30 tablet 0   ??? insulin aspart protamine/insulin aspart (NOVOLOG MIX 70-30 FLEXPEN) 100 unit/mL (70-30) flex pen 30 units SQ every morning and 25 units SQ every evening 1 Package 0   ??? atorvastatin (LIPITOR) 40 mg tablet Take 1 tablet by mouth nightly. 30 tablet 0   ??? clopidogrel (PLAVIX) 75 mg tablet Take 1 tablet by mouth daily. 30 tablet 2   ??? lisinopril (PRINIVIL, ZESTRIL) 5 mg tablet Take 1 tablet by mouth daily. 30 tablet 0       Past History     Past Medical History:  Past Medical History:   Diagnosis Date   ??? Anemia    ??? Arthritis    ??? Diabetes (HCC)    ??? History of colon polyps    ??? HTN (hypertension)    ??? Hyperlipemia    ??? Stroke [facility])        Past Surgical History:  Past Surgical History:   Procedure Laterality Date   ??? COLONOSCOPY N/A 01/13/2017    COLONOSCOPY performed by Levan Hurst, MD at [facility]   ??? HX CESAREAN SECTION  1987   ??? HX COLONOSCOPY     ??? HX DILATION AND CURETTAGE  1995       Family History:  Family History  Problem Relation Age of Onset   ??? Hypertension Other 35        parent,NOS   ??? Heart Disease Other 62        parent, NOS   ??? Diabetes Other 45        parent, NOS   ??? Cancer Father        Social History:  Social History     Tobacco Use   ??? Smoking status: Current Every Day Smoker   ??? Tobacco comment: patient state that she smokes 1 black&mild /daily   Substance Use Topics   ??? Alcohol use: No   ??? Drug use: No       Allergies:  No Known Allergies      Review of Systems   Review of Systems   Constitutional: Negative for chills and fever.   HENT: Negative.    Eyes: Negative.    Respiratory: Negative for cough and shortness of breath.    Cardiovascular: Negative for chest pain and palpitations.   Gastrointestinal: Negative for abdominal pain, constipation, diarrhea, nausea and vomiting.   Genitourinary: Negative.  Negative for difficulty urinating and flank pain.   Musculoskeletal: Positive for back pain. Negative for gait problem and neck pain.   Skin: Negative.     Allergic/Immunologic: Negative.    Neurological: Negative for dizziness, weakness, numbness and headaches.   Psychiatric/Behavioral: Negative.    All other systems reviewed and are negative.    All Other Systems Negative  Physical Exam   Nursing note and vitals reviewed.  CONSTITUTIONAL: Alert, in no apparent distress; well-developed; well-nourished.   HEAD:  Normocephalic, atraumatic.   RESP: Chest clear, equal breath sounds.   CV: S1 and S2 WNL; No murmurs, gallops or rubs.   GI: Abdomen soft and non-tender. No masses or organomegaly.   BACK: bilateral Muscles in lower and mid back TTP. FROM, 5/5 strength, 2+DTR's,no lumbar paraspinal tenderness no erythema, no edema, no ecchymosis,(-) deformity, swelling, skin changes, midline tenderness or crepitus. (-) step offs.  Neg SLR bilaterlly. Full sensation to deep and light palpation bilaterally.  (-) foot drop  UPPER EXT:  Normal inspection.   LOWER EXT: Normal inspection  NEURO:  strength 5/5 and sym, sensation intact.   SKIN: No rashes; Normal for age and stage.   PSYCH:  Alert and oriented, normal affect.        Diagnostic Study Results     Labs -   No results found for this or any previous visit (from the past 12 hour(s)).    Radiologic Studies -   XR CHEST PORT    (Results Pending)     CT Results  (Last 48 hours)    None        CXR Results  (Last 48 hours)    None            Medical Decision Making   I am the first provider for this patient.    I reviewed the vital signs, available nursing notes, past medical history, past surgical history, family history and social history.    Vital Signs-Reviewed the patient's vital signs.      Pulse Oximetry Analysis - 99% on RA    Records Reviewed: Nursing Notes, Old Medical Records, Previous Radiology Studies and Previous Laboratory Studies    Procedures:  Procedures    Provider Notes (Medical Decision Making):   62 yo F here with lower back pain. Pt presents ambulatory, moving BLE in  NAD, well-hydrated, non-toxic in appearance,  with normal vitals.  Exam including neuro exam normal. No red flags such as saddle paresthesias, weakness, bladder/bowel dysfunction, IVDA, DM, or fever - very low suspicion for epidural abscess, cauda equina, or spinal cord injury.  No recent injury/trauma/heavy lifting.  Labs, EKG and work up negative. Exam and HPI consistent with lumbosacral radiculopathy/sciatica/myofascial strain/sprain.    Explained to patient that this pain may take a few weeks to completely resolve.  Will provide work note as necessary.      Progress Note/Consultations:     MED RECONCILIATION:  No current facility-administered medications for this encounter.      Current Outpatient Medications   Medication Sig   ??? ascorbic acid, vitamin C, (VITAMIN C) 250 mg tablet Take 1 Tab by mouth two (2) times daily (with meals).   ??? ferrous sulfate 325 mg (65 mg iron) tablet Take 1 Tab by mouth two (2) times daily (with meals).   ??? pantoprazole (PROTONIX) 40 mg tablet Take 1 Tab by mouth daily.   ??? multivitamin (ONE A DAY) tablet Take 1 Tab by mouth daily.   ??? metOLazone (ZAROXOLYN) 5 mg tablet Take 2.5 mg by mouth Three (3) times a week.   ??? furosemide (LASIX) 20 mg tablet Take 20 mg by mouth daily.   ??? EXENATIDE MICROSPHERES (BYDUREON SC) by SubCUTAneous route every seven (7) days.   ??? cholecalciferol (VITAMIN D3) 1,000 unit tablet Take 1 tablet by mouth daily.   ??? insulin aspart protamine/insulin aspart (NOVOLOG MIX 70-30 FLEXPEN) 100 unit/mL (70-30) flex pen 30 units SQ every morning and 25 units SQ every evening   ??? atorvastatin (LIPITOR) 40 mg tablet Take 1 tablet by mouth nightly.   ??? clopidogrel (PLAVIX) 75 mg tablet Take 1 tablet by mouth daily.   ??? lisinopril (PRINIVIL, ZESTRIL) 5 mg tablet Take 1 tablet by mouth daily.       Disposition:  home    DISCHARGE NOTE:     Pt has been reexamined.  Patient has no new complaints, changes, or  physical findings.  Care plan outlined and precautions discussed.  Results of work up, treatment plan and disposition were reviewed with the patient. All medications were reviewed with the patient; including any prescriptions given. All of pt's questions and concerns were addressed. Patient was instructed and agrees to follow up with pcp , as well as to return to the ED upon further deterioration. Patient is ready to go home.    Follow-up Information    None         Current Discharge Medication List              Diagnosis     Clinical Impression: back pain, muscle strain

## 2018-04-22 NOTE — ED Triage Notes (Signed)
Pt c/o severe lower back pain on both sides made worse with movement, pt unable to walk without pain, pt states she moved a table on Tuesday but does not remember hurting herself, pt denies dysuria, states bowel movements are irregular but that is normal for her

## 2018-04-23 LAB — EKG, 12 LEAD, INITIAL
Atrial Rate: 76 {beats}/min
Calculated P Axis: 66 degrees
Calculated R Axis: 13 degrees
Calculated T Axis: 55 degrees
Diagnosis: NORMAL
P-R Interval: 136 ms
Q-T Interval: 372 ms
QRS Duration: 88 ms
QTC Calculation (Bezet): 418 ms
Ventricular Rate: 76 {beats}/min

## 2018-04-23 LAB — EKG 12-LEAD
Atrial Rate: 76 {beats}/min
Diagnosis: NORMAL
P Axis: 66 degrees
P-R Interval: 136 ms
Q-T Interval: 372 ms
QRS Duration: 88 ms
QTc Calculation (Bazett): 418 ms
R Axis: 13 degrees
T Axis: 55 degrees
Ventricular Rate: 76 {beats}/min

## 2018-04-27 ENCOUNTER — Ambulatory Visit: Payer: Medicare Other | Admitting: Physical Medicine & Rehabilitation

## 2018-04-27 ENCOUNTER — Ambulatory Visit (INDEPENDENT_AMBULATORY_CARE_PROVIDER_SITE_OTHER): Payer: Medicare Other | Admitting: Internal Medicine

## 2018-04-27 ENCOUNTER — Other Ambulatory Visit: Payer: Self-pay

## 2018-04-27 VITALS — BP 141/73 | HR 83 | Temp 98.1°F | Ht 63.0 in | Wt 223.2 lb

## 2018-04-27 DIAGNOSIS — E785 Hyperlipidemia, unspecified: Secondary | ICD-10-CM | POA: Diagnosis not present

## 2018-04-27 DIAGNOSIS — E876 Hypokalemia: Secondary | ICD-10-CM | POA: Diagnosis not present

## 2018-04-27 DIAGNOSIS — I110 Hypertensive heart disease with heart failure: Secondary | ICD-10-CM | POA: Diagnosis not present

## 2018-04-27 DIAGNOSIS — Z79899 Other long term (current) drug therapy: Secondary | ICD-10-CM

## 2018-04-27 DIAGNOSIS — I5020 Unspecified systolic (congestive) heart failure: Secondary | ICD-10-CM

## 2018-04-27 DIAGNOSIS — E119 Type 2 diabetes mellitus without complications: Secondary | ICD-10-CM

## 2018-04-27 MED ORDER — ENALAPRIL MALEATE 5 MG PO TABS: 5.0000 mg | ORAL_TABLET | Freq: Two times a day (BID) | ORAL | 3 refills | Status: DC

## 2018-04-27 MED ORDER — METOPROLOL SUCCINATE ER 50 MG PO TB24: 25.0000 mg | ORAL_TABLET | Freq: Every day | ORAL | 2 refills | Status: DC

## 2018-04-27 MED ORDER — ENALAPRIL MALEATE 5 MG PO TABS: 2.5000 mg | ORAL_TABLET | Freq: Two times a day (BID) | ORAL | 3 refills | Status: DC

## 2018-04-27 MED ORDER — ATORVASTATIN CALCIUM 40 MG PO TABS: 40.0000 mg | ORAL_TABLET | Freq: Every day | ORAL | 5 refills | Status: DC

## 2018-04-27 NOTE — Patient Instructions (Signed)
Increase the metoprolol to 50mg  daily (can take two pills of the 25mg  metoprolol until you run out) Increase your enalapril to 5mg  twice daily (can take two pills of the 2.5mg  enalapril until you run out) Continue your lasix twice daily Start atorvastatin 40mg  daily.  Weigh yourself every day; if you notice a weight gain of more than 3lbs in 1 day or 5lbs in one week, call the office.  Keep your fluid intake to less than 64 ounces per day (this includes coffee, juice, milk, water) Keep your sodium and salt intake to less than 2000mg  a day.    Low-Sodium Eating Plan Sodium, which is an element that makes up salt, helps you maintain a healthy balance of fluids in your body. Too much sodium can increase your blood pressure and cause fluid and waste to be held in your body. Your health care provider or dietitian may recommend following this plan if you have high blood pressure (hypertension), kidney disease, liver disease, or heart failure. Eating less sodium can help lower your blood pressure, reduce swelling, and protect your heart, liver, and kidneys. What are tips for following this plan? General guidelines  Most people on this plan should limit their sodium intake to 1,500-2,000 mg (milligrams) of sodium each day. Reading food labels  The Nutrition Facts label lists the amount of sodium in one serving of the food. If you eat more than one serving, you must multiply the listed amount of sodium by the number of servings.  Choose foods with less than 140 mg of sodium per serving.  Avoid foods with 300 mg of sodium or more per serving. Shopping  Look for lower-sodium products, often labeled as "low-sodium" or "no salt added."  Always check the sodium content even if foods are labeled as "unsalted" or "no salt added".  Buy fresh foods. ? Avoid canned foods and premade or frozen meals. ? Avoid canned, cured, or processed meats  Buy breads that have less than 80 mg of sodium per  slice. Cooking  Eat more home-cooked food and less restaurant, buffet, and fast food.  Avoid adding salt when cooking. Use salt-free seasonings or herbs instead of table salt or sea salt. Check with your health care provider or pharmacist before using salt substitutes.  Cook with plant-based oils, such as canola, sunflower, or olive oil. Meal planning  When eating at a restaurant, ask that your food be prepared with less salt or no salt, if possible.  Avoid foods that contain MSG (monosodium glutamate). MSG is sometimes added to [place] food, bouillon, and some canned foods. What foods are recommended? The items listed may not be a complete list. Talk with your dietitian about what dietary choices are best for you. Grains Low-sodium cereals, including oats, puffed wheat and rice, and shredded wheat. Low-sodium crackers. Unsalted rice. Unsalted pasta. Low-sodium bread. Whole-grain breads and whole-grain pasta. Vegetables Fresh or frozen vegetables. "No salt added" canned vegetables. "No salt added" tomato sauce and paste. Low-sodium or reduced-sodium tomato and vegetable juice. Fruits Fresh, frozen, or canned fruit. Fruit juice. Meats and other protein foods Fresh or frozen (no salt added) meat, poultry, seafood, and fish. Low-sodium canned tuna and salmon. Unsalted nuts. Dried peas, beans, and lentils without added salt. Unsalted canned beans. Eggs. Unsalted nut butters. Dairy Milk. Soy milk. Cheese that is naturally low in sodium, such as ricotta cheese, fresh mozzarella, or Swiss cheese Low-sodium or reduced-sodium cheese. Cream cheese. Yogurt. Fats and oils Unsalted butter. Unsalted margarine with no trans  fat. Vegetable oils such as canola or olive oils. Seasonings and other foods Fresh and dried herbs and spices. Salt-free seasonings. Low-sodium mustard and ketchup. Sodium-free salad dressing. Sodium-free light mayonnaise. Fresh or refrigerated horseradish. Lemon juice. Vinegar.  Homemade, reduced-sodium, or low-sodium soups. Unsalted popcorn and pretzels. Low-salt or salt-free chips. What foods are not recommended? The items listed may not be a complete list. Talk with your dietitian about what dietary choices are best for you. Grains Instant hot cereals. Bread stuffing, pancake, and biscuit mixes. Croutons. Seasoned rice or pasta mixes. Noodle soup cups. Boxed or frozen macaroni and cheese. Regular salted crackers. Self-rising flour. Vegetables Sauerkraut, pickled vegetables, and relishes. Olives. [place] fries. Onion rings. Regular canned vegetables (not low-sodium or reduced-sodium). Regular canned tomato sauce and paste (not low-sodium or reduced-sodium). Regular tomato and vegetable juice (not low-sodium or reduced-sodium). Frozen vegetables in sauces. Meats and other protein foods Meat or fish that is salted, canned, smoked, spiced, or pickled. Bacon, ham, sausage, hotdogs, corned beef, chipped beef, packaged lunch meats, salt pork, jerky, pickled herring, anchovies, regular canned tuna, sardines, salted nuts. Dairy Processed cheese and cheese spreads. Cheese curds. Blue cheese. Feta cheese. String cheese. Regular cottage cheese. Buttermilk. Canned milk. Fats and oils Salted butter. Regular margarine. Ghee. Bacon fat. Seasonings and other foods Onion salt, garlic salt, seasoned salt, table salt, and sea salt. Canned and packaged gravies. Worcestershire sauce. Tartar sauce. Barbecue sauce. Teriyaki sauce. Soy sauce, including reduced-sodium. Steak sauce. Fish sauce. Oyster sauce. Cocktail sauce. Horseradish that you find on the shelf. Regular ketchup and mustard. Meat flavorings and tenderizers. Bouillon cubes. Hot sauce and Tabasco sauce. Premade or packaged marinades. Premade or packaged taco seasonings. Relishes. Regular salad dressings. Salsa. Potato and tortilla chips. Corn chips and puffs. Salted popcorn and pretzels. Canned or dried soups. Pizza. Frozen entrees and  pot pies. Summary  Eating less sodium can help lower your blood pressure, reduce swelling, and protect your heart, liver, and kidneys.  Most people on this plan should limit their sodium intake to 1,500-2,000 mg (milligrams) of sodium each day.  Canned, boxed, and frozen foods are high in sodium. Restaurant foods, fast foods, and pizza are also very high in sodium. You also get sodium by adding salt to food.  Try to cook at home, eat more fresh fruits and vegetables, and eat less fast food, canned, processed, or prepared foods. This information is not intended to replace advice given to you by your health care provider. Make sure you discuss any questions you have with your health care provider. Document Released: 01/10/2002 Document Revised: 07/14/2016 Document Reviewed: 07/14/2016 Elsevier Interactive Patient Education  2018 [employer].   Fluid Restriction Some health conditions may require you to restrict your fluid intake. This means that you need to limit the amount of fluid you drink each day. When you have a fluid restriction, you must carefully measure and keep track of the amount of fluid you drink. Your health care provider will identify the specific amount of fluid you are allowed each day. This amount may depend on several things, such as:  The amount of urine you produce in a day.  How much fluid you are keeping (retaining) in your body.  Your blood pressure.  What is my plan? Your health care provider recommends that you limit your fluid intake to __________ per day. What counts toward my fluid intake? Your fluid intake includes all liquids that you drink, as well as any foods that become liquid at room temperature.  The following are examples of some fluids that you will have to restrict:  Tea, coffee, soda, lemonade, milk, water, juice, sport drinks, and nutritional supplement beverages.  Alcoholic beverages.  Cream.  Gravy.  Ice cubes.  Soup and  broth.  The following are examples of foods that become liquid at room temperature. These foods will also count toward your fluid intake.  Ice cream and ice milk.  Frozen yogurt and sherbet.  Frozen ice pops.  Flavored gelatin.  How do I keep track of my fluid intake? Each morning, fill a jug with the amount of water that equals the amount of fluid you are allowed for the day. You can use this water as a guideline for fluid allowance. Each time you take in any form of fluid, including ice cubes and foods that become liquid at room temperature, pour an equal amount of water out of the container. This helps you to see how much fluid you are taking in. It also helps you to see how much of your fluid intake is left for the rest of the day. The following conversions may also be helpful in measuring your fluid intake:  1 cup equals 8 oz (240 mL).   cup equals 6 oz (180 mL).  ? cup equals 5? oz (160 mL).   cup equals 4 oz (120 mL).  ? cup equals 2? oz (80 mL).   cup equals 2 oz (60 mL).  2 Tbsp equals 1 oz (30 mL).  What home care instructions should I follow while restricting fluids?  Make sure that you stay within the recommended limit each day. Always measure and keep track of your fluids, as well as any foods that turn liquid at room temperature.  Use small cups and glasses and learn to sip fluids slowly.  Add a slice of fresh lemon or lemon juice to water or ice. This helps to satisfy your thirst.  Freeze fruit juice or water in an ice cube tray. Use this as part of your fluid allowance. These cubes are useful for quenching your thirst. Measure the amount of liquid in each ice cube prior to freezing so you can subtract this amount from your day's allowance when you consume each frozen cube.  Try frozen fruits between meals, such as grapes or strawberries.  Swallow your pills along with meals or soft foods, such as applesauce or mashed potatoes. This helps you to save your  fluid allowance for something that you enjoy.  Weigh yourself every day. Keeping track of your daily weight can help you and your health care provider to notice as soon as possible if you are retaining too much fluid in your body. ? Weigh yourself every morning after you urinate but before you eat breakfast. ? Wear the same amount of clothing each time you weigh yourself. ? Write down your daily weight. Give this weight record to your health care provider. If your weight is going up, you may be retaining too much fluid. Every 2 cups (480 mL) of fluid retained in the body becomes an extra 1 lb (0.45 kg) of body weight.  Avoid salty foods. These foods make you thirsty and make fluid control more difficult.  Brush your teeth often or rinse your mouth with mouthwash to help your dry mouth. Lemon wedges, hard sour candies, chewing gum, or breath spray may also help to moisten your mouth.  Keep the temperature in your home at a cooler level. Dry air increases thirst, so keep the  air in your home as humid as possible.  Avoid being out in the hot sun, which can cause you to sweat and become thirsty. What are some signs that I may be taking in too much fluid? You may be taking in too much fluid if:  Your weight increases. Contact your health care provider if your weight increases 3 lb or more in a day or if it increases 5 lb or more in a week.  Your face, hands, legs, feet, and belly (abdomen) start to swell.  You have trouble breathing.  This information is not intended to replace advice given to you by your health care provider. Make sure you discuss any questions you have with your health care provider. Document Released: 05/18/2007 Document Revised: 12/27/2015 Document Reviewed: 12/20/2013 Elsevier Interactive Patient Education  [employer].

## 2018-04-27 NOTE — Assessment & Plan Note (Addendum)
Patient with ?recurrent HFrEF (reports decades ago was being considered for heart transplant) with as of yet unknown etiology. TSH wnl, Hgb wnl; no known arrhythmia or ischemic event. Renal function wnl.  Patient with 3lbs weight loss and symptom improvement after initiation of BB, ACE-I, and diuretic. She does still have signs of mild hypervolemia on exam however states her symptoms are well controlled so will not adjust lasix dose at this time.   BP was elevated, HR in 70s and 80s.  ASCVD risk score >20%, would benefit from high dose statin.  Plan: --increase metoprolol to 50mg  daily --increase enalapril to 5mg  BID --continue lasix 40mg  BID --start atorvastatin 40mg  daily --f/u BMet today due to starting ACE --advised smoking cessation; advised daily weight with parameters to call clinic; advised <2L fluid intake and <2g NA intake daily --f/u in 1 month for BP --she has f/u next month with cardiology for further workup for etiology

## 2018-04-27 NOTE — Progress Notes (Signed)
   CC: CHF  HPI:  Julie Elliott is a 62 y.o. with a PMH of sCHF, HTN, HLD, T2DM diet controlled presenting to clinic for follow up on CHF.  At last visit, enalapril 2.5mg  BID, metoprolol 25mg  daily, and lasix 40mg  BID was started; patient was fluid overloaded and symptomatic with orthopnea, LE swelling, and dyspnea. Since then she has noted resolution of orthopnea (sleeps on no pillows), and significant improvement in her LE edema - she does still have some mild edema which is worse on the left (not associated with erythema, pain, knee pain). She notes increased urination with the current lasix dose. She states that she walks inside her home for exercise and as long as she takes a slow pace she is able to walk for several minutes - does not stop due to shortness of breath. Her son does note dyspnea when she is climbing the 4 steps to her home, however she also has pain and deconditioning which are contributors. She denies chest pain.  Please see problem based Assessment and Plan for status of patients chronic conditions.  Past Medical History:  Diagnosis Date  . CHF (congestive heart failure) ([place])   . Chronic headaches   . DJD (degenerative joint disease)   . DM (diabetes mellitus) ([place]) 2008   stable HgBA1C at 6.5  . GERD (gastroesophageal reflux disease)    well controlled on Omeprazole  . HLD (hyperlipidemia) 2008   stable, well controlled  . Hypertension    well controlled    Review of Systems:   Per HPI  Physical Exam:  Vitals:   04/27/18 1545  BP: (!) 141/73  Pulse: 83  Temp: 98.1 F (36.7 C)  TempSrc: Oral  SpO2: 99%  Weight: 223 lb 3.2 oz (101.2 kg)  Height: 5\' 3"  (1.6 m)   GENERAL- alert, co-operative, appears as stated age, not in any distress. CARDIAC- RRR, no murmurs, rubs or gallops. RESP- Moving equal volumes of air, bilateral fine rales to mid lungs EXTREMITIES- pulse 2+ PT, symmetric, trace RLE edema, 1+ LLE edema without associated erythema, warmth, TTP,  palpable cords SKIN- Warm, dry.  Assessment & Plan:   See Encounters Tab for problem based charting.   Patient discussed with Dr. Moshe Cipro, MD Internal Medicine PGY-3

## 2018-04-28 LAB — BMP8+ANION GAP
Anion Gap: 17 mmol/L (ref 10.0–18.0)
BUN/Creatinine Ratio: 17 (ref 12–28)
BUN: 11 mg/dL (ref 8–27)
CO2: 24 mmol/L (ref 20–29)
Calcium: 8.8 mg/dL (ref 8.7–10.3)
Chloride: 103 mmol/L (ref 96–106)
Creatinine, Ser: 0.65 mg/dL (ref 0.57–1.00)
GFR calc Af Amer: 112 mL/min/{1.73_m2} (ref >59)
GFR calc non Af Amer: 97 mL/min/{1.73_m2} (ref >59)
Glucose: 88 mg/dL (ref 65–99)
Potassium: 3.4 mmol/L — ABNORMAL LOW (ref 3.5–5.2)
Sodium: 144 mmol/L (ref 134–144)

## 2018-04-28 NOTE — Progress Notes (Signed)
Internal Medicine Clinic Attending  Case discussed with Dr. Jari Favre at the time of the visit.  We reviewed the resident's history and exam and pertinent patient test results.  I agree with the assessment, diagnosis, and plan of care documented in the resident's note.  Improving with initiation of GDMT for HFrEF. Can increase metoprolol and enalapril today as hypertensive, will continue furosemide therapy. May benefit from addition of spironolactone, especially with mild hypokalemia of 3.4 on BMP today.   Lenice Pressman, M.D., Ph.D.

## 2018-04-30 ENCOUNTER — Ambulatory Visit (HOSPITAL_BASED_OUTPATIENT_CLINIC_OR_DEPARTMENT_OTHER): Payer: Medicare Other | Admitting: Physical Medicine & Rehabilitation

## 2018-04-30 ENCOUNTER — Encounter: Payer: Self-pay | Admitting: Physical Medicine & Rehabilitation

## 2018-04-30 ENCOUNTER — Encounter: Payer: Medicare Other | Attending: Physical Medicine & Rehabilitation

## 2018-04-30 VITALS — BP 112/75 | HR 77

## 2018-04-30 DIAGNOSIS — I509 Heart failure, unspecified: Secondary | ICD-10-CM | POA: Diagnosis not present

## 2018-04-30 DIAGNOSIS — G8250 Quadriplegia, unspecified: Secondary | ICD-10-CM

## 2018-04-30 DIAGNOSIS — I10 Essential (primary) hypertension: Secondary | ICD-10-CM | POA: Insufficient documentation

## 2018-04-30 DIAGNOSIS — F17210 Nicotine dependence, cigarettes, uncomplicated: Secondary | ICD-10-CM | POA: Diagnosis not present

## 2018-04-30 DIAGNOSIS — Z9889 Other specified postprocedural states: Secondary | ICD-10-CM | POA: Insufficient documentation

## 2018-04-30 DIAGNOSIS — M1990 Unspecified osteoarthritis, unspecified site: Secondary | ICD-10-CM | POA: Diagnosis not present

## 2018-04-30 DIAGNOSIS — R51 Headache: Secondary | ICD-10-CM | POA: Insufficient documentation

## 2018-04-30 DIAGNOSIS — K219 Gastro-esophageal reflux disease without esophagitis: Secondary | ICD-10-CM | POA: Diagnosis not present

## 2018-04-30 DIAGNOSIS — E785 Hyperlipidemia, unspecified: Secondary | ICD-10-CM | POA: Diagnosis not present

## 2018-04-30 DIAGNOSIS — E119 Type 2 diabetes mellitus without complications: Secondary | ICD-10-CM | POA: Diagnosis not present

## 2018-04-30 NOTE — Progress Notes (Signed)
Subjective:    Patient ID: Julie Elliott, female    DOB: [birth year], 62 y.o.   MRN: [id number]  HPI Dysport injection 03/16/18 Number of units per muscle  FDS 200 FCR, 100  AddPoll 50 Lumbricals 150  Patient feels like her left hand is looser.  We described the muscle groups injected.  We also discussed that we had not injected the left gastroc last time and she does not feel any tighter in her left foot and ankle area.  No postinjection pain. Pain Inventory Average Pain 0 Pain Right Now 0 My pain is no pain  In the last 24 hours, has pain interfered with the following? General activity 0 Relation with others 0 Enjoyment of life 0 What TIME of day is your pain at its worst? no pain Sleep (in general) Good  Pain is worse with: no pain Pain improves with: no pain Relief from Meds: no pain  Mobility walk with assistance use a walker  Function disabled: date disabled .  Neuro/Psych trouble walking  Prior Studies Any changes since last visit?  no  Physicians involved in your care Any changes since last visit?  no   Family History  Problem Relation Age of Onset  . Diabetes Mother   . Heart disease Mother   . Prostate cancer Father   . Neuropathy Neg Hx    Social History   Socioeconomic History  . Marital status: Single    Spouse name: Not on file  . Number of children: 3  . Years of education: 51  . Highest education level: Not on file  Occupational History  . Occupation: Unemployed  Social Needs  . Financial resource strain: Not on file  . Food insecurity:    Worry: Not on file    Inability: Not on file  . Transportation needs:    Medical: Not on file    Non-medical: Not on file  Tobacco Use  . Smoking status: Current Some Day Smoker    Types: Cigarettes    Last attempt to quit: 04/18/2013    Years since quitting: 5.0  . Smokeless tobacco: Current User  . Tobacco comment: 1-2 cigs in the a.m.  Substance and Sexual Activity  . Alcohol use: No    Alcohol/week: 0.0 standard drinks  . Drug use: No  . Sexual activity: Not on file  Lifestyle  . Physical activity:    Days per week: Not on file    Minutes per session: Not on file  . Stress: Not on file  Relationships  . Social connections:    Talks on phone: Not on file    Gets together: Not on file    Attends religious service: Not on file    Active member of club or organization: Not on file    Attends meetings of clubs or organizations: Not on file    Relationship status: Not on file  Other Topics Concern  . Not on file  Social History Narrative   to get supplies nfrom [facility] per pt request.1--[phone]   Lives at home with son.    Right handed.   Caffeine use: drinks coffee-rare   Past Surgical History:  Procedure Laterality Date  . carpel tunnel Right    20 yrs ago  . CERVICAL SPINE SURGERY  2016  . TUBAL LIGATION     Past Medical History:  Diagnosis Date  . CHF (congestive heart failure) ([place])   . Chronic headaches   . DJD (degenerative joint disease)   .  DM (diabetes mellitus) ([place]) 2008   stable HgBA1C at 6.5  . GERD (gastroesophageal reflux disease)    well controlled on Omeprazole  . HLD (hyperlipidemia) 2008   stable, well controlled  . Hypertension    well controlled   BP 112/75   Pulse 77   SpO2 94%   Opioid Risk Score:   Fall Risk Score:  `1  Depression screen PHQ 2/9  Depression screen [facility] 2/9 04/27/2018 03/30/2018 02/26/2018 12/15/2017 04/28/2017 11/11/2016 05/13/2016  Decreased Interest 0 0 0 0 0 0 0  Down, Depressed, Hopeless 0 0 0 0 0 0 0  PHQ - 2 Score 0 0 0 0 0 0 0  Altered sleeping - - - - - - -  Tired, decreased energy - - - - - - -  Change in appetite - - - - - - -  Feeling bad or failure about yourself  - - - - - - -  Trouble concentrating - - - - - - -  Moving slowly or fidgety/restless - - - - - - -  Suicidal thoughts - - - - - - -  PHQ-9 Score - - - - - - -  Difficult doing work/chores - - - - - - -  Some recent data  might be hidden    Review of Systems  Constitutional: Negative.   HENT: Negative.   Eyes: Negative.   Respiratory: Negative.   Cardiovascular: Negative.   Gastrointestinal: Negative.   Endocrine: Negative.   Genitourinary: Negative.   Musculoskeletal: Positive for gait problem.  Skin: Negative.   Allergic/Immunologic: Negative.   Hematological: Negative.   Psychiatric/Behavioral: Negative.        Objective:   Physical Exam  Constitutional: She appears well-developed and well-nourished. No distress.  HENT:  Head: Normocephalic and atraumatic.  Eyes: Pupils are equal, round, and reactive to light. EOM are normal.  Neck: Normal range of motion.  Musculoskeletal:  Slight flexion contracture at the MCPs digit 2 through 4 on the left                                                                extension contracture left PIP and DIP.  Able to flex the fourth and fifth digits more easily than the second and third digits.  There is also thumb extension contracture at the PIP.  Patient does not have pain with passive flexion of the fingers. There is mild swelling of the dorsum of the hand.   Neurological:  Tone is MAS 1 at the lumbricals on the left 0 at the wrist flexors 0 at the thumb abductors, 0 at the finger flexors  Skin: Skin is warm and dry. She is not diaphoretic.  Psychiatric: She has a normal mood and affect.  Nursing note and vitals reviewed.  Motor strength is 2- at the left deltoid biceps triceps finger flexion is trace 3/5 in the left deltoid bicep tricep grip and finger extension 3+ bilateral hip flexor knee extensor ankle dorsiflexor.       Assessment & Plan:  #1.  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

## 2018-04-30 NOTE — Patient Instructions (Signed)
Practice stretching fingers on left side in a flexion pattern (fist)

## 2018-05-31 ENCOUNTER — Encounter: Payer: Self-pay | Admitting: Cardiology

## 2018-05-31 ENCOUNTER — Ambulatory Visit (INDEPENDENT_AMBULATORY_CARE_PROVIDER_SITE_OTHER): Payer: Medicare Other | Admitting: Cardiology

## 2018-05-31 VITALS — BP 121/74 | HR 93 | Resp 16 | Ht 63.0 in | Wt 222.6 lb

## 2018-05-31 DIAGNOSIS — R600 Localized edema: Secondary | ICD-10-CM

## 2018-05-31 DIAGNOSIS — Z716 Tobacco abuse counseling: Secondary | ICD-10-CM | POA: Diagnosis not present

## 2018-05-31 DIAGNOSIS — I10 Essential (primary) hypertension: Secondary | ICD-10-CM

## 2018-05-31 DIAGNOSIS — F17210 Nicotine dependence, cigarettes, uncomplicated: Secondary | ICD-10-CM | POA: Diagnosis not present

## 2018-05-31 DIAGNOSIS — I5022 Chronic systolic (congestive) heart failure: Secondary | ICD-10-CM | POA: Diagnosis not present

## 2018-05-31 DIAGNOSIS — Z7189 Other specified counseling: Secondary | ICD-10-CM | POA: Diagnosis not present

## 2018-05-31 DIAGNOSIS — Z720 Tobacco use: Secondary | ICD-10-CM

## 2018-05-31 NOTE — Progress Notes (Signed)
Cardiology Office Note:    Date:  05/31/2018   ID:  Julie Elliott, DOB June 17, 1956, MRN [id number]  PCP:  Mosetta Anis, MD  Cardiologist:  Buford Dresser, MD PhD  Referring MD: Mosetta Anis, MD   Chief Complaint  Patient presents with  . Heart Problem    History of Present Illness:    Julie Elliott is a 62 y.o. female with a hx of chronic systolic heart failure (EF 30-35%), hypertension, hyperlipidemia, type II diabetes who is seen as a new consult at the request of Mosetta Anis, MD for the evaluation and management of chronic systolic heart failure.  She is followed in the [facility], last seen by Dr. Britt Bolognese on 04/27/18. Per that note, she was improved on enalapril 2.5 mg BID, metoprolol 25 mg daily, and lasix 40 mg BID. Her metoprolol was increased to 50 mg daily and enalapril increased to 5 mg BID. She was also started on atorvastatin 40 mg.  She tells me today that she is feeling well and has no concerns. She reports that the first episode of CHF was in 1994, when she thought she had a chest cold. She had a heart cath at [facility], was told she had no blockages. Was put on the heart transplant list, started on medications. Was followed routinely, in the late 90s told heart had recovered and didn't need transplant any more.  Workup to date: Echo 03/02/18 EF 31-54%, grade 2 diastolic dysfunction, diffuse hypokinesis. Mild MR. Prior echo 11/18/2005: EF 55-65% Prior echo 3.18.2004: EF 30-40%  Has been on and off medications over the last several decades. Her main concern is to figure out why she is having nerve issues on the left side of her body (following with neurology).   Has inflammation of her hands and feet, and her swelling is worse when the inflammation/pain is worst. Feels like her feet are on fire. Doesn't weigh herself daily. Has good urine output on lasix.   Past Medical History:  Diagnosis Date  . CHF (congestive heart failure) ([place])     . Chronic headaches   . DJD (degenerative joint disease)   . DM (diabetes mellitus) ([place]) 2008   stable HgBA1C at 6.5  . GERD (gastroesophageal reflux disease)    well controlled on Omeprazole  . HLD (hyperlipidemia) 2008   stable, well controlled  . Hypertension    well controlled    Past Surgical History:  Procedure Laterality Date  . carpel tunnel Right    20 yrs ago  . CERVICAL SPINE SURGERY  2016  . TUBAL LIGATION      Current Medications: Current Outpatient Medications on File Prior to Visit  Medication Sig  . aspirin 81 MG EC tablet Take 1 tablet (81 mg total) by mouth daily.  Marland Kitchen atorvastatin (LIPITOR) 40 MG tablet Take 1 tablet (40 mg total) by mouth daily.  . enalapril (VASOTEC) 5 MG tablet Take 1 tablet (5 mg total) by mouth 2 (two) times daily.  . furosemide (LASIX) 40 MG tablet Take 1 tablet (40 mg total) by mouth 2 (two) times daily.  . metoprolol succinate (TOPROL-XL) 50 MG 24 hr tablet Take 1 tablet (50 mg total) by mouth daily.  . pregabalin (LYRICA) 75 MG capsule TAKE 1 CAPSULE IN THE MORNING, 1 CAPSULE IN THE EVENING AND 2 AT BEDTIME  . senna-docusate (SENOKOT-S) 8.6-50 MG tablet Take 2 tablets by mouth at bedtime as needed for mild constipation.   No current facility-administered  medications on file prior to visit.      Allergies:   Loratadine-pseudoephedrine er   Social History   Socioeconomic History  . Marital status: Single    Spouse name: Not on file  . Number of children: 3  . Years of education: 31  . Highest education level: Not on file  Occupational History  . Occupation: Unemployed  Social Needs  . Financial resource strain: Not on file  . Food insecurity:    Worry: Not on file    Inability: Not on file  . Transportation needs:    Medical: Not on file    Non-medical: Not on file  Tobacco Use  . Smoking status: Current Some Day Smoker    Types: Cigarettes    Last attempt to quit: 04/18/2013    Years since quitting: 5.1  .  Smokeless tobacco: Current User  . Tobacco comment: 1-2 cigs in the a.m.  Substance and Sexual Activity  . Alcohol use: No    Alcohol/week: 0.0 standard drinks  . Drug use: No  . Sexual activity: Not on file  Lifestyle  . Physical activity:    Days per week: Not on file    Minutes per session: Not on file  . Stress: Not on file  Relationships  . Social connections:    Talks on phone: Not on file    Gets together: Not on file    Attends religious service: Not on file    Active member of club or organization: Not on file    Attends meetings of clubs or organizations: Not on file    Relationship status: Not on file  Other Topics Concern  . Not on file  Social History Narrative   to get supplies nfrom [facility] [employer] per pt request.1--[phone]   Lives at home with son.    Right handed.   Caffeine use: drinks coffee-rare     Family History: The patient's family history includes Diabetes in her mother; Heart disease in her mother; Prostate cancer in her father. There is no history of Neuropathy.  ROS:   Please see the history of present illness.  Additional pertinent ROS:  Constitutional: Negative for chills, fever, night sweats, unintentional weight loss  HENT: Negative for ear pain and hearing loss.   Eyes: Negative for loss of vision and eye pain.  Respiratory: Negative for cough, sputum, shortness of breath, wheezing.   Cardiovascular: Positive for mild LE edema. Negative for chest pain, palpitations , PND, orthopnea and claudication.  Gastrointestinal: Negative for abdominal pain, melena, and hematochezia.  Genitourinary: Negative for dysuria and hematuria.  Musculoskeletal: Negative for falls and myalgias.  Skin: Negative for itching and rash.  Neurological: Negative for focal weakness, focal sensory changes and loss of consciousness.  Endo/Heme/Allergies: Does not bruise/bleed easily.    EKGs/Labs/Other Studies Reviewed:    The following studies were reviewed  today: Echo 03/02/18 Study Conclusions  - Left ventricle: The cavity size was normal. Wall thickness was   normal. Systolic function was moderately to severely reduced. The   estimated ejection fraction was in the range of 30% to 35%.   Diffuse hypokinesis. Features are consistent with a pseudonormal   left ventricular filling pattern, with concomitant abnormal   relaxation and increased filling pressure (grade 2 diastolic   dysfunction). - Mitral valve: There was mild regurgitation. Valve area by   continuity equation (using LVOT flow): 2.27 cm^2.  EKG:  EKG is ordered today.  The ekg ordered today demonstrates normal sinus rhythm  Recent Labs: 03/30/2018: Hemoglobin 14.2; Platelets 217; TSH 1.380 04/27/2018: BUN 11; Creatinine, Ser 0.65; Potassium 3.4; Sodium 144  Recent Lipid Panel    Component Value Date/Time   CHOL 158 03/30/2018 1605   TRIG 75 03/30/2018 1605   HDL 43 03/30/2018 1605   CHOLHDL 3.7 03/30/2018 1605   CHOLHDL 2.6 02/27/2015 1641   VLDL 15 02/27/2015 1641   LDLCALC 100 (H) 03/30/2018 1605    Physical Exam:    VS:  BP 121/74   Pulse 93   Resp 16   Ht 5\' 3"  (1.6 m)   Wt 222 lb 9.6 oz (101 kg)   SpO2 100%   BMI 39.43 kg/m     Wt Readings from Last 3 Encounters:  05/31/18 222 lb 9.6 oz (101 kg)  04/27/18 223 lb 3.2 oz (101.2 kg)  03/30/18 227 lb 11.2 oz (103.3 kg)     GEN: Well nourished, well developed in no acute distress HEENT: Normal NECK: No JVD; No carotid bruits LYMPHATICS: No lymphadenopathy CARDIAC: regular rhythm, normal S1 and S2, no murmurs, rubs, gallops. Radial and DP pulses 2+ bilaterally. RESPIRATORY:  Clear to auscultation without rales, wheezing or rhonchi  ABDOMEN: Soft, non-tender, non-distended MUSCULOSKELETAL:  Trace bilateral LE edema; No deformity. Left hand with trace edema as compared to right. SKIN: Warm and dry NEUROLOGIC:  Alert and oriented x 3 PSYCHIATRIC:  Normal affect   ASSESSMENT:    1. Chronic systolic  heart failure ([place])   2. Encounter for education about heart failure   3. Tobacco abuse counseling   4. Essential hypertension   5. Tobacco abuse   6. Bilateral lower extremity edema    PLAN:    1. Chronic systolic heart failure: denies any symptoms at this time at rest, NYHA class I-II. Has chronic bilateral LE edema, improved but not fully resolved. Has had this pattern previously with low EF and improvement on medications. Reports normal cath at [facility], I cannot see results.  -we discussed ischemic workup, either with R/LHC or lexiscan. She declines at this time, given that she has no chest pain and reported prior cath was normal  -on enalapril 5 mg BID, metoprolol succinate 50 mg daily, lasix 40 mg BID. I recommended uptitrating her metoprolol today, given her resting heart rate is 89 bpm. As this was only recently changed, she would like to hold on any further changes.   -discussed entresto. She is not interested at this time but will consider if her EF remains reduced at the next visit  -HF education given on daily weights, salt/fluid restrictions  -will plan to recheck echo in 3 mos to monitor for improvement. If EF remains <35%, will discuss ICD.  -on aspirin 81 mg and atorvastatin 40 mg for prevention, tolerating.  2. Tobacco abuse counseling: The patient was counseled on the dangers of tobacco use, and was advised to quit.  Reviewed strategies to maximize success, including removing cigarettes and smoking materials from environment. Total 5 minutes tobacco cessation.  3. Hypertension: at goal today. On enalapril and metoprolol as above  Plan for follow up: 3 mos, with repeat echo prior  Medication Adjustments/Labs and Tests Ordered: Current medicines are reviewed at length with the patient today.  Concerns regarding medicines are outlined above.  Orders Placed This Encounter  Procedures  . EKG 12-Lead  . ECHOCARDIOGRAM COMPLETE   No orders of the defined types were placed in this  encounter.   Patient Instructions  Medication Instructions:  Your Physician recommend you  continue on your current medication as directed.    If you need a refill on your cardiac medications before your next appointment, please call your pharmacy.   Lab work: None   Testing/Procedures: Your physician has requested that you have an echocardiogram in 3 months. Echocardiography is a painless test that uses sound waves to create images of your heart. It provides your doctor with information about the size and shape of your heart and how well your heart's chambers and valves are working. This procedure takes approximately one hour. There are no restrictions for this procedure. [place] [address]   Follow-Up: At [facility], you and your health needs are our priority.  As part of our continuing mission to provide you with exceptional heart care, we have created designated Provider Care Teams.  These Care Teams include your primary Cardiologist (physician) and Advanced Practice Providers (APPs -  Physician Assistants and Nurse Practitioners) who all work together to provide you with the care you need, when you need it. You will need a follow up appointment in 3 months.  Please call our office 2 months in advance to schedule this appointment.  You may see Buford Dresser, MD or one of the following Advanced Practice Providers on your designated Care Team:   Rosaria Ferries, PA-C . Jory Sims, DNP, ANP  Any Other Special Instructions Will Be Listed Below (If Applicable).       Signed, Buford Dresser, MD PhD 05/31/2018 6:12 PM    [facility]

## 2018-05-31 NOTE — Patient Instructions (Signed)
Medication Instructions:  Your Physician recommend you continue on your current medication as directed.    If you need a refill on your cardiac medications before your next appointment, please call your pharmacy.   Lab work: None   Testing/Procedures: Your physician has requested that you have an echocardiogram in 3 months. Echocardiography is a painless test that uses sound waves to create images of your heart. It provides your doctor with information about the size and shape of your heart and how well your heart's chambers and valves are working. This procedure takes approximately one hour. There are no restrictions for this procedure. [place] [address]   Follow-Up: At [facility], you and your health needs are our priority.  As part of our continuing mission to provide you with exceptional heart care, we have created designated Provider Care Teams.  These Care Teams include your primary Cardiologist (physician) and Advanced Practice Providers (APPs -  Physician Assistants and Nurse Practitioners) who all work together to provide you with the care you need, when you need it. You will need a follow up appointment in 3 months.  Please call our office 2 months in advance to schedule this appointment.  You may see Buford Dresser, MD or one of the following Advanced Practice Providers on your designated Care Team:   Rosaria Ferries, PA-C . Jory Sims, DNP, ANP  Any Other Special Instructions Will Be Listed Below (If Applicable).

## 2018-06-25 ENCOUNTER — Ambulatory Visit (HOSPITAL_BASED_OUTPATIENT_CLINIC_OR_DEPARTMENT_OTHER): Payer: Medicare Other | Admitting: Physical Medicine & Rehabilitation

## 2018-06-25 ENCOUNTER — Encounter: Payer: Self-pay | Admitting: Physical Medicine & Rehabilitation

## 2018-06-25 ENCOUNTER — Encounter: Payer: Medicare Other | Attending: Physical Medicine & Rehabilitation

## 2018-06-25 VITALS — BP 116/76 | HR 83 | Ht 63.0 in | Wt 222.0 lb

## 2018-06-25 DIAGNOSIS — R51 Headache: Secondary | ICD-10-CM | POA: Diagnosis not present

## 2018-06-25 DIAGNOSIS — Z9889 Other specified postprocedural states: Secondary | ICD-10-CM | POA: Insufficient documentation

## 2018-06-25 DIAGNOSIS — M1990 Unspecified osteoarthritis, unspecified site: Secondary | ICD-10-CM | POA: Insufficient documentation

## 2018-06-25 DIAGNOSIS — G800 Spastic quadriplegic cerebral palsy: Secondary | ICD-10-CM

## 2018-06-25 DIAGNOSIS — G8250 Quadriplegia, unspecified: Secondary | ICD-10-CM | POA: Insufficient documentation

## 2018-06-25 DIAGNOSIS — E785 Hyperlipidemia, unspecified: Secondary | ICD-10-CM | POA: Insufficient documentation

## 2018-06-25 DIAGNOSIS — I10 Essential (primary) hypertension: Secondary | ICD-10-CM | POA: Diagnosis not present

## 2018-06-25 DIAGNOSIS — F17210 Nicotine dependence, cigarettes, uncomplicated: Secondary | ICD-10-CM | POA: Insufficient documentation

## 2018-06-25 DIAGNOSIS — I509 Heart failure, unspecified: Secondary | ICD-10-CM | POA: Insufficient documentation

## 2018-06-25 DIAGNOSIS — E119 Type 2 diabetes mellitus without complications: Secondary | ICD-10-CM | POA: Diagnosis not present

## 2018-06-25 DIAGNOSIS — K219 Gastro-esophageal reflux disease without esophagitis: Secondary | ICD-10-CM | POA: Diagnosis not present

## 2018-06-25 NOTE — Progress Notes (Signed)
Dysport Injection for spasticity using needle EMG guidance  Dilution: 200 Units/ml Indication: Severe spasticity which interferes with ADL,mobility and/or  hygiene and is unresponsive to medication management and other conservative care Informed consent was obtained after describing risks and benefits of the procedure with the patient. This includes bleeding, bruising, infection, excessive weakness, or medication side effects. A REMS form is on file and signed. Needle:  needle electrode Number of units per muscle  AddPoll 100 Lumbricals 300  All injections were done after obtaining appropriate EMG activity and after negative drawback for blood. The patient tolerated the procedure well. Post procedure instructions were given. A followup appointment was made.

## 2018-06-29 ENCOUNTER — Other Ambulatory Visit: Payer: Self-pay | Admitting: Internal Medicine

## 2018-06-29 ENCOUNTER — Ambulatory Visit: Payer: Medicare Other | Admitting: Podiatry

## 2018-06-29 DIAGNOSIS — I5020 Unspecified systolic (congestive) heart failure: Secondary | ICD-10-CM

## 2018-07-22 ENCOUNTER — Inpatient Hospital Stay: Admit: 2018-07-22 | Payer: PRIVATE HEALTH INSURANCE | Primary: Registered Nurse

## 2018-07-22 LAB — SENTARA SPECIMEN COLLN.

## 2018-07-23 LAB — HEMOGLOBIN A1C
Estimated Avg Glucose, External: 119 mg/dL (ref 91–123)
Hemoglobin A1C, External: 5.8 % — ABNORMAL HIGH (ref 4.8–5.6)

## 2018-08-02 ENCOUNTER — Other Ambulatory Visit: Payer: Self-pay | Admitting: Internal Medicine

## 2018-08-02 DIAGNOSIS — I5020 Unspecified systolic (congestive) heart failure: Secondary | ICD-10-CM

## 2018-08-17 ENCOUNTER — Encounter: Payer: Self-pay | Admitting: Internal Medicine

## 2018-08-25 ENCOUNTER — Other Ambulatory Visit: Payer: Self-pay | Admitting: Internal Medicine

## 2018-08-25 ENCOUNTER — Other Ambulatory Visit: Payer: Self-pay | Admitting: Neurology

## 2018-08-25 DIAGNOSIS — I5020 Unspecified systolic (congestive) heart failure: Secondary | ICD-10-CM

## 2018-08-25 DIAGNOSIS — M4712 Other spondylosis with myelopathy, cervical region: Secondary | ICD-10-CM

## 2018-08-31 ENCOUNTER — Ambulatory Visit (HOSPITAL_COMMUNITY): Payer: Medicare Other | Attending: Cardiology

## 2018-08-31 DIAGNOSIS — I5022 Chronic systolic (congestive) heart failure: Secondary | ICD-10-CM | POA: Diagnosis not present

## 2018-09-01 ENCOUNTER — Telehealth: Payer: Self-pay | Admitting: Cardiology

## 2018-09-01 NOTE — Telephone Encounter (Signed)
New message     Pt calling back to discuss test results

## 2018-09-01 NOTE — Telephone Encounter (Signed)
Notes recorded by Buford Dresser, MD on 09/01/2018 at 9:04 AM EST Heart function is slightly improved compared to prior--not back to normal, but it has gone from 30-35% to 40-45%. I would continue the current medications, and we will talk about everything at the upcoming visit.  Patient notified of results. Moved AM appointment on Feb 6 to 4:20 on same day per request

## 2018-09-09 ENCOUNTER — Encounter: Payer: Self-pay | Admitting: Gastroenterology

## 2018-09-09 ENCOUNTER — Ambulatory Visit: Payer: Medicare Other | Admitting: Cardiology

## 2018-09-10 ENCOUNTER — Encounter: Payer: Self-pay | Admitting: Cardiology

## 2018-09-10 ENCOUNTER — Ambulatory Visit (INDEPENDENT_AMBULATORY_CARE_PROVIDER_SITE_OTHER): Payer: Medicare Other | Admitting: Cardiology

## 2018-09-10 VITALS — BP 131/77 | HR 79 | Ht 63.0 in | Wt 234.2 lb

## 2018-09-10 DIAGNOSIS — F17210 Nicotine dependence, cigarettes, uncomplicated: Secondary | ICD-10-CM

## 2018-09-10 DIAGNOSIS — I10 Essential (primary) hypertension: Secondary | ICD-10-CM | POA: Diagnosis not present

## 2018-09-10 DIAGNOSIS — I5022 Chronic systolic (congestive) heart failure: Secondary | ICD-10-CM | POA: Diagnosis not present

## 2018-09-10 DIAGNOSIS — Z7189 Other specified counseling: Secondary | ICD-10-CM | POA: Diagnosis not present

## 2018-09-10 DIAGNOSIS — Z720 Tobacco use: Secondary | ICD-10-CM

## 2018-09-10 NOTE — Patient Instructions (Signed)

## 2018-09-10 NOTE — Progress Notes (Signed)
Cardiology Office Note:    Date:  09/10/2018   ID:  Julie Elliott, DOB 05/30/1956, MRN [id number]  PCP:  Mosetta Anis, MD  Cardiologist:  Buford Dresser, MD PhD  Referring MD: Mosetta Anis, MD   CC: follow up  History of Present Illness:    Julie Elliott is a 63 y.o. female with a hx of chronic systolic heart failure (prior EF 30-35%, most recently 40-45%), hypertension, hyperlipidemia, type II diabetes who is seen in follow up today. She was initially seen as a new consult at the request of Mosetta Anis, MD for the evaluation and management of chronic systolic heart failure on 05/31/18 (please see note for full history).  Brief cardiac history: first episode of CHF was in 1994, when she thought she had a chest cold. She had a heart cath at [facility], was told she had no blockages. Was put on the heart transplant list, started on medications. Was followed routinely, in the late 90s told heart had recovered and didn't need transplant any more. She has been on and off cardiac medications multiple times in the past.   Workup to date: Echo 03/02/18 EF 40-98%, grade 2 diastolic dysfunction, diffuse hypokinesis. Mild MR. Prior echo 11/18/2005: EF 55-65% Prior echo 3.18.2004: EF 30-40%  Today: reviewed results of echo, with mild improvement in EF (though not normalized). She notes that she generally continues to feel well. She has had some discoloration in her lower extremities, along with a sensation of them being "on fire from the inside out." No wounds. Has been putting lotion on them, hasn't worn compression stockings. Doesn't weight herself at home but doesn't feel very swollen. Still smoking, not sure that she wants to quit.  Denies chest pain, shortness of breath at rest or with normal exertion. No PND, orthopnea, or unexpected weight gain. No syncope or palpitations.  Past Medical History:  Diagnosis Date  . CHF (congestive heart failure) ([place])   . Chronic headaches   . DJD  (degenerative joint disease)   . DM (diabetes mellitus) ([place]) 2008   stable HgBA1C at 6.5  . GERD (gastroesophageal reflux disease)    well controlled on Omeprazole  . HLD (hyperlipidemia) 2008   stable, well controlled  . Hypertension    well controlled    Past Surgical History:  Procedure Laterality Date  . carpel tunnel Right    20 yrs ago  . CERVICAL SPINE SURGERY  2016  . TUBAL LIGATION      Current Medications: Current Outpatient Medications on File Prior to Visit  Medication Sig  . aspirin 81 MG EC tablet Take 1 tablet (81 mg total) by mouth daily.  Marland Kitchen atorvastatin (LIPITOR) 40 MG tablet Take 1 tablet (40 mg total) by mouth daily.  . enalapril (VASOTEC) 5 MG tablet TAKE 1 TABLET BY MOUTH TWICE DAILY.  . furosemide (LASIX) 40 MG tablet TAKE 1 TABLET BY MOUTH TWICE DAILY.  . metoprolol succinate (TOPROL-XL) 50 MG 24 hr tablet TAKE 1 TABLET ONCE DAILY.  Marland Kitchen pregabalin (LYRICA) 75 MG capsule TAKE 1 CAPSULE IN THE MORNING, 1 CAPSULE IN THE EVENING AND 2 AT BEDTIME. FOLLOW UP NEEDED OR HAVE PRIMARY CARE PRESCRIBE.  Marland Kitchen senna-docusate (SENOKOT-S) 8.6-50 MG tablet Take 2 tablets by mouth at bedtime as needed for mild constipation.   No current facility-administered medications on file prior to visit.      Allergies:   Loratadine-pseudoephedrine er   Social History   Socioeconomic History  . Marital status: Single  Spouse name: Not on file  . Number of children: 3  . Years of education: 40  . Highest education level: Not on file  Occupational History  . Occupation: Unemployed  Social Needs  . Financial resource strain: Not on file  . Food insecurity:    Worry: Not on file    Inability: Not on file  . Transportation needs:    Medical: Not on file    Non-medical: Not on file  Tobacco Use  . Smoking status: Current Some Day Smoker    Types: Cigarettes    Last attempt to quit: 04/18/2013    Years since quitting: 5.4  . Smokeless tobacco: Current User  . Tobacco  comment: 1-2 cigs in the a.m.  Substance and Sexual Activity  . Alcohol use: No    Alcohol/week: 0.0 standard drinks  . Drug use: No  . Sexual activity: Not on file  Lifestyle  . Physical activity:    Days per week: Not on file    Minutes per session: Not on file  . Stress: Not on file  Relationships  . Social connections:    Talks on phone: Not on file    Gets together: Not on file    Attends religious service: Not on file    Active member of club or organization: Not on file    Attends meetings of clubs or organizations: Not on file    Relationship status: Not on file  Other Topics Concern  . Not on file  Social History Narrative   to get supplies nfrom [facility] per pt request.1--[phone]   Lives at home with son.    Right handed.   Caffeine use: drinks coffee-rare     Family History: The patient's family history includes Diabetes in her mother; Heart disease in her mother; Prostate cancer in her father. There is no history of Neuropathy.  ROS:   Please see the history of present illness.  Additional pertinent ROS:  Constitutional: Negative for chills, fever, night sweats, unintentional weight loss  HENT: Negative for ear pain and hearing loss.   Eyes: Negative for loss of vision and eye pain.  Respiratory: Negative for cough, sputum, shortness of breath, wheezing.   Cardiovascular: As per HPI Gastrointestinal: Negative for abdominal pain, melena, and hematochezia.  Genitourinary: Negative for dysuria and hematuria.  Musculoskeletal: Negative for falls and myalgias.  Skin: Negative for itching and rash, positive for LE skin changes.  Neurological: Negative for focal weakness, focal sensory changes and loss of consciousness.  Endo/Heme/Allergies: Does not bruise/bleed easily.    EKGs/Labs/Other Studies Reviewed:    The following studies were reviewed today: Echo 08/31/18 The left ventricle appears to be normal in size, have normal wall thickness, with  mild-moderately reduced systolic function of 63-14%. Echo evidence of impaired relaxation in diastolic filling patterns.  Echo 03/02/18 - Left ventricle: The cavity size was normal. Wall thickness was   normal. Systolic function was moderately to severely reduced. The   estimated ejection fraction was in the range of 30% to 35%.   Diffuse hypokinesis. Features are consistent with a pseudonormal   left ventricular filling pattern, with concomitant abnormal   relaxation and increased filling pressure (grade 2 diastolic   dysfunction). - Mitral valve: There was mild regurgitation. Valve area by   continuity equation (using LVOT flow): 2.27 cm^2.  EKG:  EKG is personally reviewed today.  The ekg ordered 05/31/18 demonstrates normal sinus rhythm  Recent Labs: 03/30/2018: Hemoglobin 14.2; Platelets 217; TSH 1.380  04/27/2018: BUN 11; Creatinine, Ser 0.65; Potassium 3.4; Sodium 144  Recent Lipid Panel    Component Value Date/Time   CHOL 158 03/30/2018 1605   TRIG 75 03/30/2018 1605   HDL 43 03/30/2018 1605   CHOLHDL 3.7 03/30/2018 1605   CHOLHDL 2.6 02/27/2015 1641   VLDL 15 02/27/2015 1641   LDLCALC 100 (H) 03/30/2018 1605    Physical Exam:    VS:  BP 131/77   Pulse 79   Ht 5\' 3"  (1.6 m)   Wt 234 lb 3.2 oz (106.2 kg)   SpO2 100%   BMI 41.49 kg/m     Wt Readings from Last 3 Encounters:  09/10/18 234 lb 3.2 oz (106.2 kg)  06/25/18 222 lb (100.7 kg)  05/31/18 222 lb 9.6 oz (101 kg)     GEN: Well nourished, well developed in no acute distress HEENT: Normal NECK: No JVD; No carotid bruits LYMPHATICS: No lymphadenopathy CARDIAC: regular rhythm, normal S1 and S2, no murmurs, rubs, gallops. Radial and DP pulses 2+ bilaterally. RESPIRATORY:  Clear to auscultation without rales, wheezing or rhonchi  ABDOMEN: Soft, non-tender, non-distended MUSCULOSKELETAL:  Trace bilateral LE edema; LE skin is darkened with overlying peeling skin. No blisters, no erythema, no drainage. Warm to  touch SKIN: Warm and dry NEUROLOGIC:  Alert and oriented x 3 PSYCHIATRIC:  Normal affect   ASSESSMENT:    1. Chronic systolic heart failure ([place])   2. Essential hypertension   3. Tobacco abuse   4. Encounter for education about heart failure    PLAN:    1. Chronic systolic heart failure: No symptoms at rest, minimally active. EF recently slightly improved on medication therapy. Chronic LE edema with skin changes suggestive of venous stasis/venous insufficiency.  -as previously, declines ischemic workup as she is asymptomatic and was told it was normal in the past. Not interested in entresto change today.  -on enalapril 5 mg BID, metoprolol succinate 50 mg daily, lasix 40 mg BID. She has blood pressure room to uptitrate but would like to stay on her current doses, as she believes they are working. .   -HF education given on daily weights, salt/fluid restrictions  -counseled on use of compression stockings  -on aspirin 81 mg and atorvastatin 40 mg for prevention, tolerating.  2. Tobacco abuse counseling: The patient was counseled on the dangers of tobacco use, and was advised to quit.  Reviewed strategies to maximize success, including removing cigarettes and smoking materials from environment. Total 5 minutes tobacco cessation.  3. Hypertension: within goal today. On enalapril and metoprolol as above, declines uptitration  Plan for follow up: 6 mos  TIME SPENT WITH PATIENT: 25 minutes of direct patient care. More than 50% of that time was spent on coordination of care and counseling regarding heart failure education and management.  Buford Dresser, MD, PhD [place]  [facility]   Medication Adjustments/Labs and Tests Ordered: Current medicines are reviewed at length with the patient today.  Concerns regarding medicines are outlined above.  No orders of the defined types were placed in this encounter.  No orders of the defined types were placed in this  encounter.   Patient Instructions  Medication Instructions:  Your Physician recommend you continue on your current medication as directed.    If you need a refill on your cardiac medications before your next appointment, please call your pharmacy.   Lab work: None  Testing/Procedures: None  Follow-Up: At [facility], you and your health needs are our priority.  As part of our continuing mission to provide you with exceptional heart care, we have created designated Provider Care Teams.  These Care Teams include your primary Cardiologist (physician) and Advanced Practice Providers (APPs -  Physician Assistants and Nurse Practitioners) who all work together to provide you with the care you need, when you need it. You will need a follow up appointment in 6 months.  Please call our office 2 months in advance to schedule this appointment.  You may see Buford Dresser, MD or one of the following Advanced Practice Providers on your designated Care Team:   Rosaria Ferries, PA-C . Jory Sims, DNP, ANP        Signed, Buford Dresser, MD PhD 09/10/2018 4:56 PM    [place]

## 2018-09-12 ENCOUNTER — Encounter: Payer: Self-pay | Admitting: Cardiology

## 2018-09-27 ENCOUNTER — Other Ambulatory Visit: Payer: Self-pay | Admitting: Internal Medicine

## 2018-09-27 ENCOUNTER — Encounter: Payer: Self-pay | Admitting: Physical Medicine & Rehabilitation

## 2018-09-27 ENCOUNTER — Ambulatory Visit (HOSPITAL_BASED_OUTPATIENT_CLINIC_OR_DEPARTMENT_OTHER): Payer: Medicare Other | Admitting: Physical Medicine & Rehabilitation

## 2018-09-27 ENCOUNTER — Encounter: Payer: Medicare Other | Attending: Physical Medicine & Rehabilitation

## 2018-09-27 ENCOUNTER — Other Ambulatory Visit: Payer: Self-pay | Admitting: Neurology

## 2018-09-27 VITALS — BP 135/80 | HR 71 | Ht 63.0 in | Wt 234.0 lb

## 2018-09-27 DIAGNOSIS — M4712 Other spondylosis with myelopathy, cervical region: Secondary | ICD-10-CM

## 2018-09-27 DIAGNOSIS — I5020 Unspecified systolic (congestive) heart failure: Secondary | ICD-10-CM

## 2018-09-27 DIAGNOSIS — G8250 Quadriplegia, unspecified: Secondary | ICD-10-CM | POA: Diagnosis not present

## 2018-09-27 MED ORDER — PREGABALIN 75 MG PO CAPS: ORAL_CAPSULE | ORAL | 4 refills | Status: DC

## 2018-09-27 NOTE — Telephone Encounter (Signed)
Patient needs an appt for further refills  

## 2018-09-27 NOTE — Telephone Encounter (Signed)
Pt has an appt scheduled for tomorrow w/Dr Truman Hayward.

## 2018-09-27 NOTE — Patient Instructions (Signed)
You received a Dysport injection today. You may experience muscle pains and aches. He may apply ice 20 minutes every 2 hours as needed for the next 24-48 hours. He also noticed bleeding or bruising in the areas that were injected. May apply Band-Aid. If this bruising is extensive, please notify our office. If there is evidence of increasing redness that occurs 2-3 days after injection. Please call our office. This could be a sign of infection. It is very rare, however. You may experience some muscle weakness in the muscles and injected. This would likely start in about one week.  

## 2018-09-27 NOTE — Progress Notes (Signed)
Dysport Injection for spasticity using needle EMG guidance  Dilution: 150 Units/ml Indication: Severe spasticity which interferes with ADL,mobility and/or  hygiene and is unresponsive to medication management and other conservative care Informed consent was obtained after describing risks and benefits of the procedure with the patient. This includes bleeding, bruising, infection, excessive weakness, or medication side effects. A REMS form is on file and signed. Needle:  needle electrode Number of units per muscle  AddPoll 75 Lumbricals 150 FDS 75  All injections were done after obtaining appropriate EMG activity and after negative drawback for blood. The patient tolerated the procedure well. Post procedure instructions were given. A followup appointment was made.

## 2018-09-28 ENCOUNTER — Ambulatory Visit (INDEPENDENT_AMBULATORY_CARE_PROVIDER_SITE_OTHER): Payer: Medicare Other | Admitting: Internal Medicine

## 2018-09-28 VITALS — BP 135/71 | HR 63 | Temp 97.9°F | Ht 63.0 in | Wt 233.7 lb

## 2018-09-28 DIAGNOSIS — M62838 Other muscle spasm: Secondary | ICD-10-CM

## 2018-09-28 DIAGNOSIS — I5042 Chronic combined systolic (congestive) and diastolic (congestive) heart failure: Secondary | ICD-10-CM | POA: Diagnosis not present

## 2018-09-28 DIAGNOSIS — Z981 Arthrodesis status: Secondary | ICD-10-CM

## 2018-09-28 DIAGNOSIS — E785 Hyperlipidemia, unspecified: Secondary | ICD-10-CM | POA: Diagnosis not present

## 2018-09-28 DIAGNOSIS — I110 Hypertensive heart disease with heart failure: Secondary | ICD-10-CM | POA: Diagnosis not present

## 2018-09-28 DIAGNOSIS — I10 Essential (primary) hypertension: Secondary | ICD-10-CM

## 2018-09-28 DIAGNOSIS — Z79899 Other long term (current) drug therapy: Secondary | ICD-10-CM

## 2018-09-28 DIAGNOSIS — I509 Heart failure, unspecified: Secondary | ICD-10-CM | POA: Diagnosis not present

## 2018-09-28 DIAGNOSIS — Z8739 Personal history of other diseases of the musculoskeletal system and connective tissue: Secondary | ICD-10-CM | POA: Diagnosis not present

## 2018-09-28 DIAGNOSIS — I5043 Acute on chronic combined systolic (congestive) and diastolic (congestive) heart failure: Secondary | ICD-10-CM

## 2018-09-28 DIAGNOSIS — Z720 Tobacco use: Secondary | ICD-10-CM

## 2018-09-28 DIAGNOSIS — E782 Mixed hyperlipidemia: Secondary | ICD-10-CM

## 2018-09-28 MED ORDER — METOPROLOL SUCCINATE ER 50 MG PO TB24: 50.0000 mg | ORAL_TABLET | Freq: Every day | ORAL | 3 refills | Status: DC

## 2018-09-28 MED ORDER — ASPIRIN 81 MG PO TBEC: 81.0000 mg | DELAYED_RELEASE_TABLET | Freq: Every day | ORAL | 3 refills | Status: DC

## 2018-09-28 MED ORDER — ENALAPRIL MALEATE 5 MG PO TABS: 5.0000 mg | ORAL_TABLET | Freq: Two times a day (BID) | ORAL | 3 refills | Status: DC

## 2018-09-28 MED ORDER — ATORVASTATIN CALCIUM 40 MG PO TABS: 40.0000 mg | ORAL_TABLET | Freq: Every day | ORAL | 3 refills | Status: DC

## 2018-09-28 MED ORDER — FUROSEMIDE 40 MG PO TABS: 40.0000 mg | ORAL_TABLET | Freq: Two times a day (BID) | ORAL | 3 refills | Status: DC

## 2018-09-28 NOTE — Progress Notes (Signed)
CC: Heart Failure with Reduced Ejection Fraction  HPI: [name] is a 63 y.o. F w/ PMH of CHF, HTn, Cervical myelopathy s/p cervical arthrodesis presenting for management of her chronic conditions. She states she has been having significant improvement in her symptoms and mentions that she no longer 'feels heavy.' She states she received her dysport injection yesterday but complains that she has not been seeing much improvement lately and discussed with Dr.Kirsteins about possibly adjusting her management for her spasticity of left extremities. She denies any chest pain, palpitations, dyspnea, worsening lower extremity edema or cough.  Past Medical History:  Diagnosis Date  . CHF (congestive heart failure) ([place])   . Chronic headaches   . DJD (degenerative joint disease)   . DM (diabetes mellitus) ([place]) 2008   stable HgBA1C at 6.5  . GERD (gastroesophageal reflux disease)    well controlled on Omeprazole  . HLD (hyperlipidemia) 2008   stable, well controlled  . Hypertension    well controlled   Review of Systems: Review of Systems  Constitutional: Negative for chills, fever, malaise/fatigue and weight loss.  Eyes: Negative for blurred vision.  Respiratory: Negative for shortness of breath.   Cardiovascular: Positive for leg swelling. Negative for chest pain, palpitations and orthopnea.  Gastrointestinal: Negative for diarrhea, nausea and vomiting.  Neurological: Negative for dizziness and headaches.    Physical Exam: Vitals:   09/28/18 1540 09/28/18 1610  BP: (!) 142/75 135/71  Pulse: 70 63  Temp: 97.9 F (36.6 C)   TempSrc: Oral   SpO2: 98%   Weight: 233 lb 11.2 oz (106 kg)   Height: 5\' 3"  (1.6 m)     Physical Exam  Constitutional: She is oriented to person, place, and time. She appears well-developed and well-nourished. No distress.  Eyes: EOM are normal.  Conjunctival injection on left eye with tearing  Neck: Normal range of motion. Neck supple.    Cardiovascular: Normal rate, regular rhythm, normal heart sounds and intact distal pulses. Exam reveals no gallop.  No murmur heard. Respiratory: Effort normal. She has rales (bibasilar rales).  GI: Soft. Bowel sounds are normal. There is no abdominal tenderness. There is no guarding.  Musculoskeletal: Normal range of motion.        General: Edema (2+ edema up to mid shin) present.  Neurological: She is alert and oriented to person, place, and time.  Chronic left hand spasticity     Assessment & Plan:   Essential hypertension BP Readings from Last 3 Encounters:  09/28/18 135/71  09/27/18 135/80  09/10/18 131/77   Blood pressure above goal today. Patient denies any significant chest pain, palpitations, headache, blurry vision. States she likes her current regimen. Discussed importance of avoiding salt intake. Discussed increasing her bp med dosage if amenable. States she spoke with her cardiologist and decided to watch her response and consider changing her anti-hypertensive regimen on her next visit with cardiology and would not like to change her regimen at this time.  - C/w current regimen: enalapril 5mg  BID, metoprolol succinate 50mg  daily, furosemide 40mg  BID - BMP today  Combined systolic and diastolic congestive heart failure ([place]) Presents with signs of volume overload. She had f/u with cardiology since last seen [facility]. She declined any ischemic eval or change in medications at that time. She underwent a repeat echocardiogram in 08/2018 which showed improvement in her EF and signs of diastolic dysfunction. She states she will have f/u appointment with cardiology in couple months to discuss next steps.  Her weight has increased from 222lbs last fall to 233lbs today. Symptomatically she states she feels Elliott and is not endorsing significant dyspnea or orthopnea but she has pitting edema and rales on exam. Currently on lasix 40mg  BID and tolerating it well. She states she has  good urinary output. She will need to continue diuresis until she reaches dry weight. States she does not weigh herself everyday. Discussed importance of daily weights and titrating up frequency of her furosemide dosing if she notices significant weight gain of >3lbs in 1 day. [name] expressed understanding and her son who is with her states he will assist.  She also has difficulty managing her feet due to significant edema. Her previous podiatry office closed down and she requires new referral to establish care with a new podiatry clinic.  - C/w furosemide 40mg  BID, Metoprolol 50mg  daily, enalapril 5mg  BID - F/u in 1 month - Referral to podaitry  Hyperlipidemia Lipid Panel     Component Value Date/Time   CHOL 158 03/30/2018 1605   TRIG 75 03/30/2018 1605   HDL 43 03/30/2018 1605   CHOLHDL 3.7 03/30/2018 1605   CHOLHDL 2.6 02/27/2015 1641   VLDL 15 02/27/2015 1641   LDLCALC 100 (H) 03/30/2018 1605   Started on high intensity statin during last visit. Tolerating it well without significant myalgia. Can repeat lipid panel on 03/2019 to monitor for response.  - C/w atorvastatin 40mg  daily    Patient discussed with Dr. Evette Elliott   -Julie Elliott, PGY1

## 2018-09-28 NOTE — Patient Instructions (Signed)
Julie Elliott  Thank you for allowing [place] to provide your care today. Today we discussed your heart failure and blood pressure    I have ordered electrolyte labs for you. I will call if any are abnormal.    Today we made no changes to your medications.    Please follow-up in 1 months.    Should you have any questions or concerns please call the internal medicine clinic at [phone].

## 2018-09-29 LAB — BMP8+ANION GAP
Anion Gap: 16 mmol/L (ref 10.0–18.0)
BUN/Creatinine Ratio: 16 (ref 12–28)
BUN: 9 mg/dL (ref 8–27)
CO2: 21 mmol/L (ref 20–29)
Calcium: 8.5 mg/dL — ABNORMAL LOW (ref 8.7–10.3)
Chloride: 107 mmol/L — ABNORMAL HIGH (ref 96–106)
Creatinine, Ser: 0.58 mg/dL (ref 0.57–1.00)
GFR calc Af Amer: 116 mL/min/{1.73_m2} (ref >59)
GFR calc non Af Amer: 101 mL/min/{1.73_m2} (ref >59)
Glucose: 113 mg/dL — ABNORMAL HIGH (ref 65–99)
POTASSIUM: 3.6 mmol/L (ref 3.5–5.2)
Sodium: 144 mmol/L (ref 134–144)

## 2018-09-30 ENCOUNTER — Encounter: Payer: Self-pay | Admitting: Internal Medicine

## 2018-09-30 NOTE — Assessment & Plan Note (Addendum)
Presents with signs of volume overload. She had f/u with cardiology since last seen at [facility] clinic. She declined any ischemic eval or change in medications at that time. She underwent a repeat echocardiogram in 08/2018 which showed improvement in her EF and signs of diastolic dysfunction. She states she will have f/u appointment with cardiology in couple months to discuss next steps. Her weight has increased from 222lbs last fall to 233lbs today. Symptomatically she states she feels better and is not endorsing significant dyspnea or orthopnea but she has pitting edema and rales on exam. Currently on lasix 40mg  BID and tolerating it well. She states she has good urinary output. She will need to continue diuresis until she reaches dry weight. States she does not weigh herself everyday. Discussed importance of daily weights and titrating up frequency of her furosemide dosing if she notices significant weight gain of >3lbs in 1 day. Ms.Stingley expressed understanding and her son who is with her states he will assist.  She also has difficulty managing her feet due to significant edema. Her previous podiatry office closed down and she requires new referral to establish care with a new podiatry clinic.  - C/w furosemide 40mg  BID, Metoprolol 50mg  daily, enalapril 5mg  BID - F/u in 1 month - Referral to podaitry

## 2018-09-30 NOTE — Assessment & Plan Note (Addendum)
BP Readings from Last 3 Encounters:  09/28/18 135/71  09/27/18 135/80  09/10/18 131/77   Blood pressure above goal today. Patient denies any significant chest pain, palpitations, headache, blurry vision. States she likes her current regimen. Discussed importance of avoiding salt intake. Discussed increasing her bp med dosage if amenable. States she spoke with her cardiologist and decided to watch her response and consider changing her anti-hypertensive regimen on her next visit with cardiology and would not like to change her regimen at this time.  - C/w current regimen: enalapril 5mg  BID, metoprolol succinate 50mg  daily, furosemide 40mg  BID - BMP today

## 2018-09-30 NOTE — Progress Notes (Signed)
Internal Medicine Clinic Attending ° °Case discussed with Dr. Lee at the time of the visit.  We reviewed the resident’s history and exam and pertinent patient test results.  I agree with the assessment, diagnosis, and plan of care documented in the resident’s note.  °

## 2018-09-30 NOTE — Assessment & Plan Note (Addendum)
Lipid Panel     Component Value Date/Time   CHOL 158 03/30/2018 1605   TRIG 75 03/30/2018 1605   HDL 43 03/30/2018 1605   CHOLHDL 3.7 03/30/2018 1605   CHOLHDL 2.6 02/27/2015 1641   VLDL 15 02/27/2015 1641   LDLCALC 100 (H) 03/30/2018 1605   Started on high intensity statin during last visit. Tolerating it well without significant myalgia. Can repeat lipid panel on 03/2019 to monitor for response.  - C/w atorvastatin 40mg  daily

## 2018-10-07 ENCOUNTER — Encounter: Payer: Self-pay | Admitting: Podiatry

## 2018-10-07 ENCOUNTER — Ambulatory Visit (INDEPENDENT_AMBULATORY_CARE_PROVIDER_SITE_OTHER): Payer: Medicare Other | Admitting: Podiatry

## 2018-10-07 ENCOUNTER — Other Ambulatory Visit: Payer: Self-pay | Admitting: Podiatry

## 2018-10-07 ENCOUNTER — Ambulatory Visit (INDEPENDENT_AMBULATORY_CARE_PROVIDER_SITE_OTHER): Payer: Medicare Other

## 2018-10-07 VITALS — BP 111/69 | HR 72 | Resp 16

## 2018-10-07 DIAGNOSIS — M779 Enthesopathy, unspecified: Secondary | ICD-10-CM

## 2018-10-07 DIAGNOSIS — M79675 Pain in left toe(s): Secondary | ICD-10-CM

## 2018-10-07 DIAGNOSIS — M79674 Pain in right toe(s): Secondary | ICD-10-CM

## 2018-10-07 DIAGNOSIS — B351 Tinea unguium: Secondary | ICD-10-CM

## 2018-10-07 DIAGNOSIS — M25571 Pain in right ankle and joints of right foot: Secondary | ICD-10-CM

## 2018-10-07 DIAGNOSIS — M25572 Pain in left ankle and joints of left foot: Secondary | ICD-10-CM | POA: Diagnosis not present

## 2018-10-07 MED ORDER — TRIAMCINOLONE ACETONIDE 10 MG/ML IJ SUSP
10.0000 mg | Freq: Once | INTRAMUSCULAR | Status: AC
  Administered 2018-10-07: 10 mg

## 2018-10-07 NOTE — Progress Notes (Signed)
   Subjective:    Patient ID: Julie Elliott, female    DOB: [birth year], 63 y.o.   MRN: [id number]  HPI    Review of Systems  All other systems reviewed and are negative.      Objective:   Physical Exam        Assessment & Plan:

## 2018-10-10 NOTE — Progress Notes (Signed)
Subjective:   Patient ID: Julie Elliott, female   DOB: 63 y.o.   MRN: [id number]   HPI Patient presents with a lot of pain in the ankles of both feet and nail disease that she cannot take care of.  She is not in good health and is moderately obese which is complicating factor and states that she would like to try to be a little more active.  Patient does not smoke and is not active currently   Review of Systems  All other systems reviewed and are negative.       Objective:  Physical Exam Vitals signs and nursing note reviewed.  Constitutional:      Appearance: She is well-developed.  Pulmonary:     Effort: Pulmonary effort is normal.  Musculoskeletal: Normal range of motion.  Skin:    General: Skin is warm.  Neurological:     Mental Status: She is alert.     Neurovascular status intact muscle strength is adequate range of motion is within normal limits except some splinting in the ankle.  Patient has exquisite discomfort in the ankle region bilateral with inflammation fluid of the sinus tarsi and is severe nail disease 1-5 both feet that are thick yellow brittle and painful when palpated.  Patient does have good digital perfusion well oriented     Assessment:  Acute sinus tarsitis bilateral with significant mycotic nail infection 1-5 both feet     Plan:  H&P conditions reviewed sterile prep applied and injected the sinus tarsi bilateral 3 mg Kenalog 5 mg Xylocaine applied sterile dressings and debrided nailbeds 1-5 both feet with no iatrogenic bleeding of the nailbeds noted

## 2018-10-13 ENCOUNTER — Inpatient Hospital Stay: Admit: 2018-10-13 | Primary: Registered Nurse

## 2018-10-13 LAB — SENTARA SPECIMEN COLLN.

## 2018-12-03 ENCOUNTER — Inpatient Hospital Stay: Admit: 2018-12-03 | Payer: PRIVATE HEALTH INSURANCE | Primary: Registered Nurse

## 2018-12-03 ENCOUNTER — Encounter

## 2018-12-03 DIAGNOSIS — M79671 Pain in right foot: Secondary | ICD-10-CM

## 2018-12-13 ENCOUNTER — Ambulatory Visit
Admit: 2018-12-13 | Discharge: 2018-12-13 | Payer: PRIVATE HEALTH INSURANCE | Attending: Foot and Ankle Surgery | Primary: Registered Nurse

## 2018-12-13 ENCOUNTER — Ambulatory Visit: Attending: Foot and Ankle Surgery | Primary: Registered Nurse

## 2018-12-13 DIAGNOSIS — S92414A Nondisplaced fracture of proximal phalanx of right great toe, initial encounter for closed fracture: Secondary | ICD-10-CM

## 2018-12-13 NOTE — Progress Notes (Signed)
AMBULATORY PROGRESS NOTE      Patient: [name] Short             MRN: [id number]     SSN: [id number] Body mass index is 33.33 kg/m??.  Date of Birth: August 03, 1956     AGE: 63 y.o.       EX: female    PCP: Marissa Nestleajao, Rogaciano M Jr., MD       IMPRESSION//  DIAGNOSIS AND TREATMENT PLAN      DIAGNOSES  1. Closed nondisplaced fracture of proximal phalanx of right great toe, initial encounter        Orders Placed This Encounter   ??? Generic Supply Order     Hard Sole Shoe   ??? CLOSED TX BIG TOE FRACTURE         PLAN  HPI //  OBJECTIVE EXAMINATION       [name] Short IS A 63 y.o. female who presents to my outpatient office for evaluation of:     Right great toe pain, after accidentally hitting her right great toe, an object table, few days ago.  She [name] possible facility, for which this date is Dec 03, 2018.  X-ray results are listed as below.  This showed nondisplaced non angulated fracture to the right distal phalanx the right great toe.  On close inspection, there is a linear line, also seen to the great toe distal end of the proximal phalanx, possibly indicating a nondisplaced fracture or a possible vascular channel    When I examined her she has tenderness the right great toe distal phalanx and the distal end of the proximal phalanx.    Chief complaint, is right great toe pain after hitting an object, about 4 5 days ago    Injury date Dec 03, 2018.      Visit Vitals  BP 132/71   Pulse 79   Temp 96.8 ??F (36 ??C)   Resp 16   Ht 4\' 11"  (1.499 m)   Wt 165 lb (74.8 kg)   SpO2 100%   BMI 33.33 kg/m??       Visit Vitals  BP 132/71   Pulse 79   Temp 96.8 ??F (36 ??C)   Resp 16   Ht 4\' 11"  (1.499 m)   Wt 165 lb (74.8 kg)   SpO2 100%   BMI 33.33 kg/m??       Appearance: Alert, well appearing and pleasant patient who is in no distress, oriented to person, place/time, and who follows commands.   Psychiatric: Affect and mood are appropriate.       Hearings Intact, does not require hearing aid device   Respiratory: Breathing is unlabored without accessory chest muscle use  Cardiovascular/Peripheral Vascular: Normal Pulses to each hand and foot       ANKLE/FOOT right    Psychiatry: Alert, Oriented x 3; Speech normal in context and clarity,            Memory intact grossly, no involuntary movements - tremors, no dementia  Gait: slow  Tenderness: Moderate tenderness is present to the right great toe distal phalanx and proximal phalanx near the IP joint region.  Cutaneous: slight right great toe  Joint Motion: WNL   Joint /Tendon Stability: Stability Testing: Peroneal tendon instability : not present   Instability of ankle not present, Subtalar instability not present   Alignment: neutral Hindfoot, none Metatarsus Adductus Metatarsus.  Neuro Motor/Sensory: NL/NL, Vascular: NL foot/ankle pulses  Lymphatics: No extremity lymphedema, No calf swelling, no tenderness to calf muscles.            CHART REVIEW     [name] Short has been experiencing pain and discomfort confirmed as outlined in the pain assessment outlined below.      Pain Assessment  12/13/2018   Location of Pain Toe   Location Modifiers Right   Severity of Pain 5   Quality of Pain Aching;Dull;Sharp   Duration of Pain A few hours   Frequency of Pain Intermittent   Aggravating Factors Walking;Standing   Relieving Factors Rest;Elevation   Result of Injury Yes   Work-Related Injury No   Type of Injury Other (Comment)        [name] Short  has a past medical history of Anemia, Arthritis, Diabetes (HCC), History of colon polyps, HTN (hypertension), Hyperlipemia, and Stroke (HCC).     Patients is employed at:       Lab Results   Component Value Date/Time    Hemoglobin A1c 5.1 12/21/2016 05:57 AM    Hemoglobin A1c 6.1 (H) 11/25/2015 10:25 AM    Hemoglobin A1c 7.3 (H) 04/24/2013 06:50 AM        Lab Results   Component Value Date/Time    Glucose 134 (H) 04/22/2018 08:55 AM    Glucose (POC) 103 01/13/2017 12:35 PM         Lab Results   Component Value Date/Time    Hemoglobin A1c 5.1 12/21/2016 05:57 AM         No results found for: VITD3, Esperanza Sheets, VD3RIA, VITD25OHEXT        Past Medical History:   Diagnosis Date   ??? Anemia    ??? Arthritis    ??? Diabetes (HCC)    ??? History of colon polyps    ??? HTN (hypertension)    ??? Hyperlipemia    ??? Stroke [facility])      Current Outpatient Medications   Medication Sig   ??? ascorbic acid, vitamin C, (VITAMIN C) 250 mg tablet Take 1 Tab by mouth two (2) times daily (with meals).   ??? ferrous sulfate 325 mg (65 mg iron) tablet Take 1 Tab by mouth two (2) times daily (with meals).   ??? pantoprazole (PROTONIX) 40 mg tablet Take 1 Tab by mouth daily.   ??? multivitamin (ONE A DAY) tablet Take 1 Tab by mouth daily.   ??? metOLazone (ZAROXOLYN) 5 mg tablet Take 2.5 mg by mouth Three (3) times a week.   ??? furosemide (LASIX) 20 mg tablet Take 20 mg by mouth daily.   ??? EXENATIDE MICROSPHERES (BYDUREON SC) by SubCUTAneous route every seven (7) days.   ??? cholecalciferol (VITAMIN D3) 1,000 unit tablet Take 1 tablet by mouth daily.   ??? insulin aspart protamine/insulin aspart (NOVOLOG MIX 70-30 FLEXPEN) 100 unit/mL (70-30) flex pen 30 units SQ every morning and 25 units SQ every evening   ??? atorvastatin (LIPITOR) 40 mg tablet Take 1 tablet by mouth nightly.   ??? clopidogrel (PLAVIX) 75 mg tablet Take 1 tablet by mouth daily.   ??? lisinopril (PRINIVIL, ZESTRIL) 5 mg tablet Take 1 tablet by mouth daily.     No current facility-administered medications for this visit.      THE PMP FOR [facility]  WAS REVIEWED BY Netta Corrigan, MD 12/13/2018 .   No Known Allergies  Past Surgical History:   Procedure Laterality Date   ??? COLONOSCOPY N/A 01/13/2017    COLONOSCOPY performed  by Levan Hurst, [facility] at [facility]   ??? HX CESAREAN SECTION  1987   ??? HX COLONOSCOPY     ??? HX DILATION AND CURETTAGE  1995     Social History     Occupational History   ??? Not on file   Tobacco Use    ??? Smoking status: Current Every Day Smoker   ??? Smokeless tobacco: Never Used   ??? Tobacco comment: patient state that she smokes 1 black&mild /daily   Substance and Sexual Activity   ??? Alcohol use: No   ??? Drug use: No   ??? Sexual activity: Never     Family History   Problem Relation Age of Onset   ??? Hypertension Other 35        parent,NOS   ??? Heart Disease Other 62        parent, NOS   ??? Diabetes Other 45        parent, NOS   ??? Cancer Father         REVIEW OF SYSTEMS : 12/13/2018  ALL BELOW ARE Negative except : SEE HPI     Constitutional: Negative for fever, chills and weight loss. Neg Weight Loss  Cardiovascular: Negative for chest pain, claudication and leg swelling. SOB, DOE   Gastrointestinal/Urological: Negative for pain, N/V/D/C, Blood in stool or urine,dysuria,  Hematuria, Incontinence  Musculoskeletal: see HPI.  Neurological: Negative for dizziness and weakness, headaches,Visual Changes or   Confusion, or Seizures,   Psychiatric/Behavioral: Negative for depression, memory loss and substance abuse.   Extremities:  Negative for hair changes, rash or skin lesion changes.  Hematologic: Negative for Bleeding problems, bruising, pallor or swollen lymph nodes.  Peripheral Vascular: No calf pain, vascular vein tenderness to calf pain              No calf throbbing, posterior knee throbbing pain     DIAGNOSTIC IMAGING      [facility]: INTERPRETATION BY RADIOLOGIST     XR Results (most recent):  Results from Hospital Encounter encounter on 12/03/18   XR FOOT RT MIN 3 V    Narrative Right Foot Complete    History: Trauma, pain    Technique: AP, Lateral, and oblique views of the foot were obtained.    Comparison: 3/14    Findings:     Lucency at the medial edge of the distal phalange base first digit with  intra-articular extension likely representing a nondisplaced fracture. Very  subtle vertical lucency proximal phalanx, may represent a vascular channel   versus additional nondisplaced fracture. Mild adjacent contusion. Small plantar  calcaneal spur.      Impression Impression:    Nondisplaced fracture distal phalanx base digit.  -Additional subtle vertical lucency proximal phalange as well.          I have personally reviewed the results of the above study. The interpretation of this study is my professional opinion.      Netta Corrigan, MD  12/13/2018  8:47 AM

## 2018-12-13 NOTE — Progress Notes (Signed)
 AMBULATORY PROGRESS NOTE      Patient: [name] Short             MRN: [id number]     SSN: [id number] Body mass index is 33.33 kg/m.  Date of Birth: 08-27-1955     AGE: 63 y.o.       EX: female    PCP: Dajao, Rogaciano M Jr., MD       IMPRESSION//  DIAGNOSIS AND TREATMENT PLAN      DIAGNOSES  1. Closed nondisplaced fracture of proximal phalanx of right great toe, initial encounter        Orders Placed This Encounter   . Generic Supply Order     Hard Sole Shoe   . CLOSED TX BIG TOE FRACTURE         PLAN  HPI //  OBJECTIVE EXAMINATION       [name] Short IS A 63 y.o. female who presents to my outpatient office for evaluation of:     Right great toe pain, after accidentally hitting her right great toe, an object table, few days ago.  She arrives here with x-rays, obtained [facility] [place] view, possible facility, for which this date is Dec 03, 2018.  X-ray results are listed as below.  This showed nondisplaced non angulated fracture to the right distal phalanx the right great toe.  On close inspection, there is a linear line, also seen to the great toe distal end of the proximal phalanx, possibly indicating a nondisplaced fracture or a possible vascular channel    When I examined her she has tenderness the right great toe distal phalanx and the distal end of the proximal phalanx.    Chief complaint, is right great toe pain after hitting an object, about 4 5 days ago    Injury date Dec 03, 2018.      Visit Vitals  BP 132/71   Pulse 79   Temp 96.8 F (36 C)   Resp 16   Ht 4\' 11"  (1.499 m)   Wt 165 lb (74.8 kg)   SpO2 100%   BMI 33.33 kg/m       Visit Vitals  BP 132/71   Pulse 79   Temp 96.8 F (36 C)   Resp 16   Ht 4\' 11"  (1.499 m)   Wt 165 lb (74.8 kg)   SpO2 100%   BMI 33.33 kg/m       Appearance: Alert, well appearing and pleasant patient who is in no distress, oriented to person, place/time, and who follows commands.   Psychiatric: Affect and mood are appropriate.      Hearings Intact, does not require hearing  aid device   Respiratory: Breathing is unlabored without accessory chest muscle use  Cardiovascular/Peripheral Vascular: Normal Pulses to each hand and foot       ANKLE/FOOT right    Psychiatry: Alert, Oriented x 3; Speech normal in context and clarity,            Memory intact grossly, no involuntary movements - tremors, no dementia  Gait: slow  Tenderness: Moderate tenderness is present to the right great toe distal phalanx and proximal phalanx near the IP joint region.  Cutaneous: slight right great toe  Joint Motion: WNL   Joint /Tendon Stability: Stability Testing: Peroneal tendon instability : not present   Instability of ankle not present, Subtalar instability not present   Alignment: neutral Hindfoot, none Metatarsus Adductus Metatarsus.  Neuro Motor/Sensory: NL/NL, Vascular: NL foot/ankle pulses  Lymphatics: No extremity lymphedema, No calf swelling, no tenderness to calf muscles.            CHART REVIEW     [name] Short has been experiencing pain and discomfort confirmed as outlined in the pain assessment outlined below.      Pain Assessment  12/13/2018   Location of Pain Toe   Location Modifiers Right   Severity of Pain 5   Quality of Pain Aching;Dull;Sharp   Duration of Pain A few hours   Frequency of Pain Intermittent   Aggravating Factors Walking;Standing   Relieving Factors Rest;Elevation   Result of Injury Yes   Work-Related Injury No   Type of Injury Other (Comment)        [name] Short  has a past medical history of Anemia, Arthritis, Diabetes (HCC), History of colon polyps, HTN (hypertension), Hyperlipemia, and Stroke (HCC).     Patients is employed at:       Lab Results   Component Value Date/Time    Hemoglobin A1c 5.1 12/21/2016 05:57 AM    Hemoglobin A1c 6.1 (H) 11/25/2015 10:25 AM    Hemoglobin A1c 7.3 (H) 04/24/2013 06:50 AM        Lab Results   Component Value Date/Time    Glucose 134 (H) 04/22/2018 08:55 AM    Glucose (POC) 103 01/13/2017 12:35 PM        Lab Results   Component Value Date/Time     Hemoglobin A1c 5.1 12/21/2016 05:57 AM         No results found for: VITD3, XQVID2, XQVID3, XQVID, VD3RIA, VITD25OHEXT        Past Medical History:   Diagnosis Date   . Anemia    . Arthritis    . Diabetes (HCC)    . History of colon polyps    . HTN (hypertension)    . Hyperlipemia    . Stroke [facility])      Current Outpatient Medications   Medication Sig   . ascorbic acid, vitamin C, (VITAMIN C) 250 mg tablet Take 1 Tab by mouth two (2) times daily (with meals).   . ferrous sulfate  325 mg (65 mg iron ) tablet Take 1 Tab by mouth two (2) times daily (with meals).   . pantoprazole (PROTONIX) 40 mg tablet Take 1 Tab by mouth daily.   . multivitamin (ONE A DAY) tablet Take 1 Tab by mouth daily.   . metOLazone (ZAROXOLYN) 5 mg tablet Take 2.5 mg by mouth Three (3) times a week.   . furosemide  (LASIX ) 20 mg tablet Take 20 mg by mouth daily.   Aaron Aas EXENATIDE MICROSPHERES (BYDUREON SC) by SubCUTAneous route every seven (7) days.   . cholecalciferol (VITAMIN D3) 1,000 unit tablet Take 1 tablet by mouth daily.   . insulin  aspart protamine/insulin  aspart (NOVOLOG MIX 70-30 FLEXPEN) 100 unit/mL (70-30) flex pen 30 units SQ every morning and 25 units SQ every evening   . atorvastatin  (LIPITOR ) 40 mg tablet Take 1 tablet by mouth nightly.   . clopidogrel  (PLAVIX ) 75 mg tablet Take 1 tablet by mouth daily.   . lisinopril  (PRINIVIL , ZESTRIL ) 5 mg tablet Take 1 tablet by mouth daily.     No current facility-administered medications for this visit.      THE PMP FOR [facility]  WAS REVIEWED BY Janyce Attica, MD 12/13/2018 .   No Known Allergies  Past Surgical History:   Procedure Laterality Date   . COLONOSCOPY N/A 01/13/2017    COLONOSCOPY performed  by Geneva Kerbs, MD at [facility]   . HX CESAREAN SECTION  1987   . HX COLONOSCOPY     . HX DILATION AND CURETTAGE  1995     Social History     Occupational History   . Not on file   Tobacco Use   . Smoking status: Current Every Day Smoker   . Smokeless tobacco: Never Used   . Tobacco  comment: patient state that she smokes 1 black&mild /daily   Substance and Sexual Activity   . Alcohol use: No   . Drug use: No   . Sexual activity: Never     Family History   Problem Relation Age of Onset   . Hypertension Other 35        parent,NOS   . Heart Disease Other 62        parent, NOS   . Diabetes Other 45        parent, NOS   . Cancer Father         REVIEW OF SYSTEMS : 12/13/2018  ALL BELOW ARE Negative except : SEE HPI     Constitutional: Negative for fever, chills and weight loss. Neg Weight Loss  Cardiovascular: Negative for chest pain, claudication and leg swelling. SOB, DOE   Gastrointestinal/Urological: Negative for pain, N/V/D/C, Blood in stool or urine,dysuria,  Hematuria, Incontinence  Musculoskeletal: see HPI.  Neurological: Negative for dizziness and weakness, headaches,Visual Changes or   Confusion, or Seizures,   Psychiatric/Behavioral: Negative for depression, memory loss and substance abuse.   Extremities:  Negative for hair changes, rash or skin lesion changes.  Hematologic: Negative for Bleeding problems, bruising, pallor or swollen lymph nodes.  Peripheral Vascular: No calf pain, vascular vein tenderness to calf pain              No calf throbbing, posterior knee throbbing pain     DIAGNOSTIC IMAGING      [facility]: INTERPRETATION BY RADIOLOGIST     XR Results (most recent):  Results from Hospital Encounter encounter on 12/03/18   XR FOOT RT MIN 3 V    Narrative Right Foot Complete    History: Trauma, pain    Technique: AP, Lateral, and oblique views of the foot were obtained.    Comparison: 3/14    Findings:     Lucency at the medial edge of the distal phalange base first digit with  intra-articular extension likely representing a nondisplaced fracture. Very  subtle vertical lucency proximal phalanx, may represent a vascular channel  versus additional nondisplaced fracture. Mild adjacent contusion. Small plantar  calcaneal spur.      Impression Impression:    Nondisplaced  fracture distal phalanx base digit.  -Additional subtle vertical lucency proximal phalange as well.          I have personally reviewed the results of the above study. The interpretation of this study is my professional opinion.      Janyce Regent, MD  12/13/2018  8:47 AM

## 2019-01-06 ENCOUNTER — Ambulatory Visit: Payer: Medicare Other | Admitting: Podiatry

## 2019-01-17 ENCOUNTER — Other Ambulatory Visit: Payer: Self-pay

## 2019-01-17 ENCOUNTER — Encounter: Payer: Self-pay | Admitting: Podiatry

## 2019-01-17 ENCOUNTER — Ambulatory Visit (INDEPENDENT_AMBULATORY_CARE_PROVIDER_SITE_OTHER): Payer: Medicare Other | Admitting: Podiatry

## 2019-01-17 VITALS — Temp 97.7°F

## 2019-01-17 DIAGNOSIS — B351 Tinea unguium: Secondary | ICD-10-CM | POA: Diagnosis not present

## 2019-01-17 DIAGNOSIS — M79675 Pain in left toe(s): Secondary | ICD-10-CM

## 2019-01-17 DIAGNOSIS — M79674 Pain in right toe(s): Secondary | ICD-10-CM

## 2019-01-17 DIAGNOSIS — M779 Enthesopathy, unspecified: Secondary | ICD-10-CM

## 2019-01-19 NOTE — Progress Notes (Signed)
Subjective:   Patient ID: Julie Elliott, female   DOB: 63 y.o.   MRN: [id number]   HPI Patient presents with nail disease 1-5 both feet that are thick and bothersome   ROS      Objective:  Physical Exam  Neurovascular status intact with thick yellow brittle nailbeds 1-5 both feet     Assessment:  Mycotic nail infections with pain 1-5 both the     Plan:  Debride painful nailbeds 1-5 both feet with no iatrogenic bleeding noted

## 2019-01-28 ENCOUNTER — Encounter: Payer: Medicare Other | Admitting: Physical Medicine & Rehabilitation

## 2019-02-01 ENCOUNTER — Inpatient Hospital Stay: Admit: 2019-02-01 | Primary: Registered Nurse

## 2019-02-01 LAB — SENTARA SPECIMEN COLLN.

## 2019-02-02 LAB — HEMOGLOBIN A1C
Estimated Avg Glucose, External: 125 mg/dL — ABNORMAL HIGH (ref 91–123)
Hemoglobin A1C, External: 6 % — ABNORMAL HIGH (ref 4.8–5.6)

## 2019-02-22 ENCOUNTER — Encounter: Payer: Self-pay | Admitting: Physical Medicine & Rehabilitation

## 2019-02-22 ENCOUNTER — Other Ambulatory Visit: Payer: Self-pay

## 2019-02-22 ENCOUNTER — Encounter: Payer: Medicare Other | Attending: Physical Medicine & Rehabilitation | Admitting: Physical Medicine & Rehabilitation

## 2019-02-22 VITALS — BP 136/89 | HR 79 | Temp 97.5°F | Ht 63.0 in | Wt 252.0 lb

## 2019-02-22 DIAGNOSIS — G8250 Quadriplegia, unspecified: Secondary | ICD-10-CM | POA: Insufficient documentation

## 2019-02-22 NOTE — Progress Notes (Signed)
Subjective:    Patient ID: Julie Elliott, female    DOB: 07-09-1956, 63 y.o.   MRN: [id number]  HPI 63 year old female with history of cervical myelopathy with spastic quadriparesis who returns today to follow-up after botulinum toxin injection last performed in February.  Because of the COVID-19 pandemic, the patient was not seen in the office until today.  She feels like her finger flexors are still doing pretty well that she can extend them with help from her right upper extremity.  She does not feel any tightness in her wrist or her elbow flexors. She is ambulating with a cane which is her baseline.  Her son assists her with household chores.  Pain Inventory Average Pain 0 Pain Right Now 0 My pain is no pain  In the last 24 hours, has pain interfered with the following? General activity 0 Relation with others 0 Enjoyment of life 0 What TIME of day is your pain at its worst? no pain Sleep (in general) Good  Pain is worse with: no pain Pain improves with: no pain Relief from Meds: no pain  Mobility walk with assistance use a cane use a walker ability to climb steps?  no do you drive?  no  Function disabled: date disabled na I need assistance with the following:  dressing, bathing, toileting, meal prep, household duties and shopping  Neuro/Psych weakness numbness tremor tingling trouble walking  Prior Studies Any changes since last visit?  no  Physicians involved in your care Any changes since last visit?  yes Poditrist   Family History  Problem Relation Age of Onset  . Diabetes Mother   . Heart disease Mother   . Prostate cancer Father   . Neuropathy Neg Hx    Social History   Socioeconomic History  . Marital status: Single    Spouse name: Not on file  . Number of children: 3  . Years of education: 80  . Highest education level: Not on file  Occupational History  . Occupation: Unemployed  Social Needs  . Financial resource strain: Not on file  .  Food insecurity    Worry: Not on file    Inability: Not on file  . Transportation needs    Medical: Not on file    Non-medical: Not on file  Tobacco Use  . Smoking status: Current Some Day Smoker    Types: Cigarettes    Last attempt to quit: 04/18/2013    Years since quitting: 5.8  . Smokeless tobacco: Current User  . Tobacco comment: 1-2 cigs in the a.m.  Substance and Sexual Activity  . Alcohol use: No    Alcohol/week: 0.0 standard drinks  . Drug use: No  . Sexual activity: Not on file  Lifestyle  . Physical activity    Days per week: Not on file    Minutes per session: Not on file  . Stress: Not on file  Relationships  . Social Herbalist on phone: Not on file    Gets together: Not on file    Attends religious service: Not on file    Active member of club or organization: Not on file    Attends meetings of clubs or organizations: Not on file    Relationship status: Not on file  Other Topics Concern  . Not on file  Social History Narrative   to get supplies nfrom [employer] per pt request.1--[phone]   Lives at home with son.    Right handed.  Caffeine use: drinks coffee-rare   Past Surgical History:  Procedure Laterality Date  . carpel tunnel Right    20 yrs ago  . CERVICAL SPINE SURGERY  2016  . TUBAL LIGATION     Past Medical History:  Diagnosis Date  . CHF (congestive heart failure) ([place])   . Chronic headaches   . DJD (degenerative joint disease)   . DM (diabetes mellitus) ([place]) 2008   stable HgBA1C at 6.5  . GERD (gastroesophageal reflux disease)    well controlled on Omeprazole  . HLD (hyperlipidemia) 2008   stable, well controlled  . Hypertension    well controlled   BP 136/89   Pulse 79   Temp (!) 97.5 F (36.4 C)   Ht 5\' 3"  (1.6 m)   Wt 252 lb (114.3 kg)   SpO2 98%   BMI 44.64 kg/m   Opioid Risk Score:   Fall Risk Score:  `1  Depression screen PHQ 2/9  Depression screen [facility] 2/9 02/22/2019 04/27/2018 03/30/2018 02/26/2018  12/15/2017 04/28/2017 11/11/2016  Decreased Interest 0 0 0 0 0 0 0  Down, Depressed, Hopeless 0 0 0 0 0 0 0  PHQ - 2 Score 0 0 0 0 0 0 0  Altered sleeping - - - - - - -  Tired, decreased energy - - - - - - -  Change in appetite - - - - - - -  Feeling bad or failure about yourself  - - - - - - -  Trouble concentrating - - - - - - -  Moving slowly or fidgety/restless - - - - - - -  Suicidal thoughts - - - - - - -  PHQ-9 Score - - - - - - -  Difficult doing work/chores - - - - - - -  Some recent data might be hidden   Review of Systems  Constitutional: Negative.   HENT: Negative.   Eyes: Negative.   Respiratory: Negative.   Cardiovascular: Negative.   Gastrointestinal: Negative.   Endocrine: Negative.   Genitourinary: Negative.   Musculoskeletal: Positive for gait problem.  Skin: Negative.   Allergic/Immunologic: Negative.   Neurological: Positive for tremors, weakness and numbness.  Hematological: Negative.   Psychiatric/Behavioral: Negative.   All other systems reviewed and are negative.      Objective:   Physical Exam Vitals signs and nursing note reviewed.  Constitutional:      Appearance: Normal appearance. She is obese.  Eyes:     Extraocular Movements: Extraocular movements intact.     Conjunctiva/sclera: Conjunctivae normal.     Pupils: Pupils are equal, round, and reactive to light.  Neurological:     Mental Status: She is alert.  Psychiatric:        Mood and Affect: Mood normal.   Strength is 3- at the left deltoid bicep tricep finger flexors and extensor 4- at the right deltoid bicep tricep grip hip flexor knee extensor ankle dorsiflexor 4- at the left hip flexion extensor ankle dorsiflexor Skill skeletal she has mild tenderness palpation of the left greater trochanter. Tenderness in the left lumbar paraspinals as well. She has mildly reduced hip external rotation with moderate reduced hip internal rotation as well.  MAS 3 in the finger flexors of digits 2  3 and 4.  MAS 1 in thumb flexor MAS 0 in finger flexors of digit 5 MAS 0 at wrist flexor MAS 1 at the lumbricals MAS 1 at the elbow flexor on the left side  Assessment & Plan:  1.  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  She has some intermittent paresthesias and dysesthesias as well.  She does remain on Lyrica as prescribed by neurology. In addition she has spasticity which has affected her left upper greater than right upper limb.  She does have some milder spasticity in lower extremities but this does not interfere with ambulation. We discussed that she may be a good candidate for repeat Dysport injection concentrating on the finger flexors, would recommend 150 units to the FDP and 150 units FDS on the left side.  Her lumbricals do not need any repeat injection at this time. She will think about this and if she feels like her fingers are getting tighter she will call.

## 2019-02-22 NOTE — Patient Instructions (Signed)
Please call if Left hand fingers get tighter

## 2019-02-28 ENCOUNTER — Other Ambulatory Visit: Payer: Self-pay | Admitting: Neurology

## 2019-02-28 DIAGNOSIS — M4712 Other spondylosis with myelopathy, cervical region: Secondary | ICD-10-CM

## 2019-04-01 ENCOUNTER — Other Ambulatory Visit: Payer: Self-pay | Admitting: Neurology

## 2019-04-01 DIAGNOSIS — M4712 Other spondylosis with myelopathy, cervical region: Secondary | ICD-10-CM

## 2019-04-04 NOTE — Telephone Encounter (Signed)
 Database Verified LR: 03-01-2019 Qty: 120 Pending appointment: No pending appt

## 2019-04-04 NOTE — Telephone Encounter (Signed)
Faxed signed Lyrica prescription to [facility]. Received a receipt of confirmation.

## 2019-04-19 ENCOUNTER — Emergency Department: Admit: 2019-04-19 | Payer: Worker's Compensation | Primary: Registered Nurse

## 2019-04-19 ENCOUNTER — Inpatient Hospital Stay
Admit: 2019-04-19 | Discharge: 2019-04-19 | Disposition: A | Discharge status: Home / Self Care | Payer: Worker's Compensation | Attending: Emergency Medicine

## 2019-04-19 DIAGNOSIS — S0003XA Contusion of scalp, initial encounter: Secondary | ICD-10-CM

## 2019-04-19 MED ORDER — OXYCODONE-ACETAMINOPHEN 5 MG-325 MG TAB
5-325 mg | ORAL | Status: AC
Start: 2019-04-19 — End: 2019-04-19
  Administered 2019-04-19: via ORAL

## 2019-04-19 MED ORDER — ACETAMINOPHEN 325 MG TABLET
325 mg | ORAL | Status: AC
Start: 2019-04-19 — End: 2019-04-19
  Administered 2019-04-19: via ORAL

## 2019-04-19 MED ORDER — CYCLOBENZAPRINE 5 MG TAB
5 mg | ORAL_TABLET | Freq: Three times a day (TID) | ORAL | 0 refills | Status: AC | PRN
Start: 2019-04-19 — End: 2019-04-22

## 2019-04-19 MED ORDER — OXYCODONE-ACETAMINOPHEN 5 MG-325 MG TAB
5-325 mg | ORAL_TABLET | Freq: Four times a day (QID) | ORAL | 0 refills | Status: AC | PRN
Start: 2019-04-19 — End: 2019-04-22

## 2019-04-19 MED FILL — OXYCODONE-ACETAMINOPHEN 5 MG-325 MG TAB: 5-325 mg | ORAL | Qty: 1

## 2019-04-19 MED FILL — MAPAP (ACETAMINOPHEN) 325 MG TABLET: 325 mg | ORAL | Qty: 3

## 2019-04-19 NOTE — ED Notes (Signed)
I have reviewed discharge instructions with the patient. The patient verbalized understanding.

## 2019-04-19 NOTE — ED Notes (Signed)
6:30 PM :Pt care assumed from Dr. Ebrom, ED provider. Pt complaint(s), current treatment plan, progression and available diagnostic results have been discussed thoroughly.  Rounding occurred: yes  Intended Disposition: Pending at time of turnover  Pending diagnostic reports and/or labs (please list): CT scans of the head and cervical spine    7:20pm: CT scans of the head and cervical spine are unremarkable for acute injury.  Patient still has a headache and neck pain, I will give her a tablet of Percocet here and then a short course of Percocet and Flexeril as an outpatient.  Final diagnoses are cervical strain, scalp contusion, and mechanical fall.    Niall Illes, MD

## 2019-04-19 NOTE — ED Notes (Signed)
Pt reports " I fell and bang my head" Pt denies LOC, pt states " I as wide awake and fell and don't know how"

## 2019-04-19 NOTE — ED Provider Notes (Signed)
 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  She was standing at her desk when she suddenly felt pain in her right knee causing her to fall onto her right side and she hit her head on the desk.  She denies any visual changes, nausea, vomiting, syncope, or any other complaints.  She did not lose consciousness and prior to falling did not experience any chest pain, palpitations or shortness of breath. She did not take any pain medication prior to arrival here.  No other complaints.           Past Medical History:   Diagnosis Date   . Anemia    . Arthritis    . Diabetes (HCC)    . History of colon polyps    . HTN (hypertension)    . Hyperlipemia    . Stroke [facility])        Past Surgical History:   Procedure Laterality Date   . COLONOSCOPY N/A 01/13/2017    COLONOSCOPY performed by Geneva Kerbs, MD at [facility]   . HX CESAREAN SECTION  1987   . HX COLONOSCOPY     . HX DILATION AND CURETTAGE  1995         Family History:   Problem Relation Age of Onset   . Hypertension Other 35        parent,NOS   . Heart Disease Other 62        parent, NOS   . Diabetes Other 45        parent, NOS   . Cancer Father        Social History     Socioeconomic History   . Marital status: SINGLE     Spouse name: Not on file   . Number of children: Not on file   . Years of education: Not on file   . Highest education level: Not on file   Occupational History   . Not on file   Social Needs   . Financial resource strain: Not on file   . Food insecurity     Worry: Not on file     Inability: Not on file   . Transportation needs     Medical: Not on file     Non-medical: Not on file   Tobacco Use   . Smoking status: Current Every Day Smoker   . Smokeless tobacco: Never Used   . Tobacco comment: patient state that she  smokes 1 black&mild /daily   Substance and Sexual Activity   . Alcohol use: No   . Drug use: No   . Sexual activity: Never   Lifestyle   . Physical activity     Days per week: Not on file     Minutes per session: Not on file   . Stress: Not on file   Relationships   . Social Wellsite geologist on phone: Not on file     Gets together: Not on file     Attends religious service: Not on file     Active member of club or organization: Not on file     Attends meetings of clubs or organizations: Not on file     Relationship status: Not on file   . Intimate partner violence  Fear of current or ex partner: Not on file     Emotionally abused: Not on file     Physically abused: Not on file     Forced sexual activity: Not on file   Other Topics Concern   . Not on file   Social History Narrative   . Not on file         ALLERGIES: Patient has no known allergies.    Review of Systems   Constitutional: Negative for activity change and appetite change.   HENT: Negative for drooling and facial swelling.    Eyes: Negative for pain and discharge.   Respiratory: Negative for apnea and choking.    Cardiovascular: Negative for palpitations and leg swelling.   Gastrointestinal: Negative for blood in stool and rectal pain.   Endocrine: Negative for polydipsia and polyphagia.   Genitourinary: Negative for genital sores and hematuria.   Musculoskeletal: Negative for gait problem and neck stiffness.   Skin: Negative for color change and rash.   Allergic/Immunologic: Negative for environmental allergies.   Neurological: Positive for headaches. Negative for tremors.   Hematological: Negative for adenopathy.   Psychiatric/Behavioral: Negative for agitation and behavioral problems.       Vitals:    04/19/19 1530   BP: 125/67   Pulse: 86   Resp: 16   Temp: 98.4 F (36.9 C)   SpO2: 100%            Physical Exam  Vitals signs and nursing note reviewed.   Constitutional:       Appearance: Normal appearance.   HENT:      Head: Normocephalic.       Comments: Right-sided frontotemporal hematoma approximately 5 cm in greatest diameter with mild tenderness to palpation.  Small abrasion on right forehead.  No laceration or active bleeding noted.  Eyes:      Extraocular Movements: Extraocular movements intact.      Pupils: Pupils are equal, round, and reactive to light.   Cardiovascular:      Rate and Rhythm: Normal rate and regular rhythm.      Heart sounds: Normal heart sounds. No murmur. No friction rub.   Pulmonary:      Effort: Pulmonary effort is normal.      Breath sounds: Normal breath sounds.   Abdominal:      Palpations: Abdomen is soft.   Musculoskeletal: Normal range of motion.   Skin:     General: Skin is warm and dry.   Neurological:      General: No focal deficit present.      Mental Status: She is alert and oriented to person, place, and time.   Psychiatric:         Mood and Affect: Mood normal.          MDM  Number of Diagnoses or Management Options  Contusion of scalp, initial encounter:   Fall from slip, trip, or stumble, initial encounter:   Strain of neck muscle, initial encounter:   Diagnosis management comments: 63 year old female with right-sided head trauma after a fall.  She takes Plavix  and baby aspirin  on a daily basis.  Patient has no focal neurological deficits but given that she takes blood thinners and is 63 years of age, will follow NEXUS criteria and obtain CT of the head and cervical spine without contrast to assess for any acute intracranial injury or fracture.  Patient is ambulatory with a steady gait and has no other complaints.  She does not want  any pain medication at this time.    Patient signed out to Dr. Lanie Pitcairn at 1800 pending results of CT head/C-spine.    Geraldyne Kluver, DO           Procedures

## 2019-04-19 NOTE — ED Provider Notes (Signed)
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  She was standing at her desk when she suddenly felt pain in her right knee causing her to fall onto her right side and she hit her head on the desk.  She denies any visual changes, nausea, vomiting, syncope, or any other complaints.  She did not lose consciousness and prior to falling did not experience any chest pain, palpitations or shortness of breath. She did not take any pain medication prior to arrival here.  No other complaints.           Past Medical History:   Diagnosis Date   ??? Anemia    ??? Arthritis    ??? Diabetes (HCC)    ??? History of colon polyps    ??? HTN (hypertension)    ??? Hyperlipemia    ??? Stroke [facility](HCC)        Past Surgical History:   Procedure Laterality Date   ??? COLONOSCOPY N/A 01/13/2017    COLONOSCOPY performed by Levan Hursthomas Duntemann, MD at [facility]   ??? HX CESAREAN SECTION  1987   ??? HX COLONOSCOPY     ??? HX DILATION AND CURETTAGE  1995         Family History:   Problem Relation Age of Onset   ??? Hypertension Other 35        parent,NOS   ??? Heart Disease Other 62        parent, NOS   ??? Diabetes Other 45        parent, NOS   ??? Cancer Father        Social History     Socioeconomic History   ??? Marital status: SINGLE     Spouse name: Not on file   ??? Number of children: Not on file   ??? Years of education: Not on file   ??? Highest education level: Not on file   Occupational History   ??? Not on file   Social Needs   ??? Financial resource strain: Not on file   ??? Food insecurity     Worry: Not on file     Inability: Not on file   ??? Transportation needs     Medical: Not on file     Non-medical: Not on file   Tobacco Use   ??? Smoking status: Current Every Day Smoker   ??? Smokeless tobacco: Never Used    ??? Tobacco comment: patient state that she smokes 1 black&mild /daily   Substance and Sexual Activity   ??? Alcohol use: No   ??? Drug use: No   ??? Sexual activity: Never   Lifestyle   ??? Physical activity     Days per week: Not on file     Minutes per session: Not on file   ??? Stress: Not on file   Relationships   ??? Social Wellsite geologistconnections     Talks on phone: Not on file     Gets together: Not on file     Attends religious service: Not on file     Active member of club or organization: Not on file     Attends meetings of clubs or organizations: Not on file     Relationship status: Not on file   ??? Intimate partner violence  Fear of current or ex partner: Not on file     Emotionally abused: Not on file     Physically abused: Not on file     Forced sexual activity: Not on file   Other Topics Concern   ??? Not on file   Social History Narrative   ??? Not on file         ALLERGIES: Patient has no known allergies.    Review of Systems   Constitutional: Negative for activity change and appetite change.   HENT: Negative for drooling and facial swelling.    Eyes: Negative for pain and discharge.   Respiratory: Negative for apnea and choking.    Cardiovascular: Negative for palpitations and leg swelling.   Gastrointestinal: Negative for blood in stool and rectal pain.   Endocrine: Negative for polydipsia and polyphagia.   Genitourinary: Negative for genital sores and hematuria.   Musculoskeletal: Negative for gait problem and neck stiffness.   Skin: Negative for color change and rash.   Allergic/Immunologic: Negative for environmental allergies.   Neurological: Positive for headaches. Negative for tremors.   Hematological: Negative for adenopathy.   Psychiatric/Behavioral: Negative for agitation and behavioral problems.       Vitals:    04/19/19 1530   BP: 125/67   Pulse: 86   Resp: 16   Temp: 98.4 ??F (36.9 ??C)   SpO2: 100%            Physical Exam  Vitals signs and nursing note reviewed.   Constitutional:        Appearance: Normal appearance.   HENT:      Head: Normocephalic.      Comments: Right-sided frontotemporal hematoma approximately 5 cm in greatest diameter with mild tenderness to palpation.  Small abrasion on right forehead.  No laceration or active bleeding noted.  Eyes:      Extraocular Movements: Extraocular movements intact.      Pupils: Pupils are equal, round, and reactive to light.   Cardiovascular:      Rate and Rhythm: Normal rate and regular rhythm.      Heart sounds: Normal heart sounds. No murmur. No friction rub.   Pulmonary:      Effort: Pulmonary effort is normal.      Breath sounds: Normal breath sounds.   Abdominal:      Palpations: Abdomen is soft.   Musculoskeletal: Normal range of motion.   Skin:     General: Skin is warm and dry.   Neurological:      General: No focal deficit present.      Mental Status: She is alert and oriented to person, place, and time.   Psychiatric:         Mood and Affect: Mood normal.          MDM  Number of Diagnoses or Management Options  Contusion of scalp, initial encounter:   Fall from slip, trip, or stumble, initial encounter:   Strain of neck muscle, initial encounter:   Diagnosis management comments: 63 year old female with right-sided head trauma after a fall.  She takes Plavix and baby aspirin on a daily basis.  Patient has no focal neurological deficits but given that she takes blood thinners and is 63 years of age, will follow NEXUS criteria and obtain CT of the head and cervical spine without contrast to assess for any acute intracranial injury or fracture.  Patient is ambulatory with a steady gait and has no other complaints.  She does not want  any pain medication at this time.    Patient signed out to Dr. Kizzie Furnish at 1800 pending results of CT head/C-spine.    Earlie Lou, DO           Procedures

## 2019-04-19 NOTE — ED Triage Notes (Signed)
Pt reports on blood thinner plavix

## 2019-04-19 NOTE — ED Notes (Signed)
Pt reports on blood thinner plavix

## 2019-04-19 NOTE — ED Notes (Signed)
Pt alert and oriented X 4 and appears to ambulate with cane without any assistance, denies dizziness, unilateral weakness

## 2019-04-19 NOTE — ED Notes (Signed)
6:30 PM :Pt care assumed from Dr. Romana Juniper, ED provider. Pt complaint(s), current treatment plan, progression and available diagnostic results have been discussed thoroughly.  Rounding occurred: yes  Intended Disposition: Pending at time of turnover  Pending diagnostic reports and/or labs (please list): CT scans of the head and cervical spine    7:20pm: CT scans of the head and cervical spine are unremarkable for acute injury.  Patient still has a headache and neck pain, I will give her a tablet of Percocet here and then a short course of Percocet and Flexeril as an outpatient.  Final diagnoses are cervical strain, scalp contusion, and mechanical fall.    Gerrit Friends, MD

## 2019-04-19 NOTE — ED Triage Notes (Signed)
Pt reports " I fell and bang my head" Pt denies LOC, pt states " I as wide awake and fell and don't know how"

## 2019-04-19 NOTE — ED Triage Notes (Signed)
Pt alert and oriented X 4 and appears to ambulate with cane without any assistance, denies dizziness, unilateral weakness

## 2019-04-20 ENCOUNTER — Encounter: Payer: Self-pay | Admitting: Podiatry

## 2019-04-20 ENCOUNTER — Other Ambulatory Visit: Payer: Self-pay

## 2019-04-20 ENCOUNTER — Ambulatory Visit (INDEPENDENT_AMBULATORY_CARE_PROVIDER_SITE_OTHER): Payer: Medicare Other | Admitting: Podiatry

## 2019-04-20 DIAGNOSIS — M25373 Other instability, unspecified ankle: Secondary | ICD-10-CM | POA: Diagnosis not present

## 2019-04-20 DIAGNOSIS — B353 Tinea pedis: Secondary | ICD-10-CM | POA: Diagnosis not present

## 2019-04-20 DIAGNOSIS — M79674 Pain in right toe(s): Secondary | ICD-10-CM

## 2019-04-20 DIAGNOSIS — B351 Tinea unguium: Secondary | ICD-10-CM

## 2019-04-20 DIAGNOSIS — M79675 Pain in left toe(s): Secondary | ICD-10-CM | POA: Diagnosis not present

## 2019-04-20 MED ORDER — CLOTRIMAZOLE-BETAMETHASONE 1-0.05 % EX CREA: TOPICAL_CREAM | CUTANEOUS | 1 refills | Status: DC

## 2019-04-20 NOTE — Patient Instructions (Addendum)
Athlete's Foot  Athlete's foot (tinea pedis) is a fungal infection of the skin on your feet. It often occurs on the skin that is between or underneath the toes. It can also occur on the soles of your feet. The infection can spread from person to person (is contagious). It can also spread when a person's bare feet come in contact with the fungus on shower floors or on items such as shoes. What are the causes? This condition is caused by a fungus that grows in warm, moist places. You can get athlete's foot by sharing shoes, shower stalls, towels, and wet floors with someone who is infected. Not washing your feet or changing your socks often enough can also lead to athlete's foot. What increases the risk? This condition is more likely to develop in:  Men.  People who have a weak body defense system (immune system).  People who have diabetes.  People who use public showers, such as at a gym.  People who wear heavy-duty shoes, such as industrial or military shoes.  Seasons with warm, humid weather. What are the signs or symptoms? Symptoms of this condition include:  Itchy areas between your toes or on the soles of your feet.  White, flaky, or scaly areas between your toes or on the soles of your feet.  Very itchy small blisters between your toes or on the soles of your feet.  Small cuts in your skin. These cuts can become infected.  Thick or discolored toenails. How is this diagnosed? This condition may be diagnosed with a physical exam and a review of your medical history. Your health care provider may also take a skin or toenail sample to examine under a microscope. How is this treated? This condition is treated with antifungal medicines. These may be applied as powders, ointments, or creams. In severe cases, an oral antifungal medicine may be given. Follow these instructions at home: Medicines  Apply or take over-the-counter and prescription medicines only as told by your health  care provider.  Apply your antifungal medicine as told by your health care provider. Do not stop using the antifungal even if your condition improves. Foot care  Do not scratch your feet.  Keep your feet dry: ? Wear cotton or wool socks. Change your socks every day or if they become wet. ? Wear shoes that allow air to flow, such as sandals or canvas tennis shoes.  Wash and dry your feet, including the area between your toes. Also, wash and dry your feet: ? Every day or as told by your health care provider. ? After exercising. General instructions  Do not let others use towels, shoes, nail clippers, or other personal items that touch your feet.  Protect your feet by wearing sandals in wet areas, such as locker rooms and shared showers.  Keep all follow-up visits as told by your health care provider. This is important.  If you have diabetes, keep your blood sugar under control. Contact a health care provider if:  You have a fever.  You have swelling, soreness, warmth, or redness in your foot.  Your feet are not getting better with treatment.  Your symptoms get worse.  You have new symptoms. Summary  Athlete's foot (tinea pedis) is a fungal infection of the skin on your feet. It often occurs on skin that is between or underneath the toes.  This condition is caused by a fungus that grows in warm, moist places.  Symptoms include white, flaky, or scaly areas between   your toes or on the soles of your feet.  This condition is treated with antifungal medicines.  Keep your feet clean. Always dry them thoroughly. This information is not intended to replace advice given to you by your health care provider. Make sure you discuss any questions you have with your health care provider. Document Released: 07/18/2000 Document Revised: 07/16/2017 Document Reviewed: 05/11/2017 Elsevier Patient Education  2020 [employer]   Onychomycosis/Fungal Toenails  WHAT IS IT? An infection  that lies within the keratin of your nail plate that is caused by a fungus.  WHY ME? Fungal infections affect all ages, sexes, races, and creeds.  There may be many factors that predispose you to a fungal infection such as age, coexisting medical conditions such as diabetes, or an autoimmune disease; stress, medications, fatigue, genetics, etc.  Bottom line: fungus thrives in a warm, moist environment and your shoes offer such a location.  IS IT CONTAGIOUS? Theoretically, yes.  You do not want to share shoes, nail clippers or files with someone who has fungal toenails.  Walking around barefoot in the same room or sleeping in the same bed is unlikely to transfer the organism.  It is important to realize, however, that fungus can spread easily from one nail to the next on the same foot.  HOW DO WE TREAT THIS?  There are several ways to treat this condition.  Treatment may depend on many factors such as age, medications, pregnancy, liver and kidney conditions, etc.  It is best to ask your doctor which options are available to you.  1. No treatment.   Unlike many other medical concerns, you can live with this condition.  However for many people this can be a painful condition and may lead to ingrown toenails or a bacterial infection.  It is recommended that you keep the nails cut short to help reduce the amount of fungal nail. 2. Topical treatment.  These range from herbal remedies to prescription strength nail lacquers.  About 40-50% effective, topicals require twice daily application for approximately 9 to 12 months or until an entirely new nail has grown out.  The most effective topicals are medical grade medications available through physicians offices. 3. Oral antifungal medications.  With an 80-90% cure rate, the most common oral medication requires 3 to 4 months of therapy and stays in your system for a year as the new nail grows out.  Oral antifungal medications do require blood work to make sure it is  a safe drug for you.  A liver function panel will be performed prior to starting the medication and after the first month of treatment.  It is important to have the blood work performed to avoid any harmful side effects.  In general, this medication safe but blood work is required. 4. Laser Therapy.  This treatment is performed by applying a specialized laser to the affected nail plate.  This therapy is noninvasive, fast, and non-painful.  It is not covered by insurance and is therefore, out of pocket.  The results have been very good with a 80-95% cure rate.  [facility] is the only practice in the area to offer this therapy. 5. Permanent Nail Avulsion.  Removing the entire nail so that a new nail will not grow back. 

## 2019-04-27 ENCOUNTER — Telehealth (INDEPENDENT_AMBULATORY_CARE_PROVIDER_SITE_OTHER): Payer: Medicare Other | Admitting: Cardiology

## 2019-04-27 ENCOUNTER — Encounter: Payer: Self-pay | Admitting: Cardiology

## 2019-04-27 DIAGNOSIS — I5042 Chronic combined systolic (congestive) and diastolic (congestive) heart failure: Secondary | ICD-10-CM | POA: Diagnosis not present

## 2019-04-27 DIAGNOSIS — F17210 Nicotine dependence, cigarettes, uncomplicated: Secondary | ICD-10-CM | POA: Diagnosis not present

## 2019-04-27 DIAGNOSIS — Z720 Tobacco use: Secondary | ICD-10-CM

## 2019-04-27 DIAGNOSIS — Z7189 Other specified counseling: Secondary | ICD-10-CM | POA: Diagnosis not present

## 2019-04-27 NOTE — Progress Notes (Signed)
Virtual Visit via Video Note   This visit type was conducted due to national recommendations for restrictions regarding the COVID-19 Pandemic (e.g. social distancing) in an effort to limit this patient's exposure and mitigate transmission in our community.  Due to her co-morbid illnesses, this patient is at least at moderate risk for complications without adequate follow up.  This format is felt to be most appropriate for this patient at this time.  All issues noted in this document were discussed and addressed.  A limited physical exam was performed with this format.  Please refer to the patient's chart for her consent to telehealth for [facility]   Date:  04/27/2019   ID:  Julie Elliott, DOB February 11, 1956, MRN [id number]  Patient Location: Home Provider Location: Home  PCP:  Mosetta Anis, MD  Cardiologist:  Buford Dresser, MD  Electrophysiologist:  None   Evaluation Performed:  Follow-Up Visit  Chief Complaint:  Follow up  History of Present Illness:    Julie Elliott is a 63 y.o. female with hx of chronic systolic heart failure (prior EF 30-35%, most recently 40-45%), hypertension, hyperlipidemia, type II diabetes who is seen in follow up today. She was initially seen as a new consult at the request of Mosetta Anis, MD for the evaluation and management of chronic systolic heart failure on 05/31/18 (please see note for full history).  Brief cardiac history: first episode of CHF was in 1994, when she thought she had a chest cold. She had a heart cath at [facility], was told she had no blockages. Was put on the heart transplant list, started on medications. Was followed routinely, in the late 90s told heart had recovered and didn't need transplant any more. She has been on and off cardiac medications multiple times in the past.   Workup to date: Echo 08/31/18: EF 40-45%, grade 1 DD Echo 03/02/18 EF 99991111, grade 2 diastolic dysfunction, diffuse hypokinesis. Mild MR. Prior echo  11/18/2005: EF 55-65% Prior echo 3.18.2004: EF 30-40%  The patient does not have symptoms concerning for COVID-19 infection (fever, chills, cough, or new shortness of breath).   Today: Doing well overall. Legs are slightly less painful than before, less burning. Legs swell intermittently, especially with the weather, but goes away on its own. No working BP cuff, intermittently weighs on scale but no routine log of weights. Trying to avoid salt. Discussed quitting smoking again today.  Denies chest pain, shortness of breath at rest or with normal exertion. No PND, orthopnea, LE edema or unexpected weight gain. No syncope or palpitations.  Tolerating all medications, denies side effects. Counseled on safe distance with COVID-19 pandemic.   Past Medical History:  Diagnosis Date  . CHF (congestive heart failure) ([place])   . Chronic headaches   . DJD (degenerative joint disease)   . DM (diabetes mellitus) ([place]) 2008   stable HgBA1C at 6.5  . GERD (gastroesophageal reflux disease)    well controlled on Omeprazole  . HLD (hyperlipidemia) 2008   stable, well controlled  . Hypertension    well controlled   Past Surgical History:  Procedure Laterality Date  . carpel tunnel Right    20 yrs ago  . CERVICAL SPINE SURGERY  2016  . TUBAL LIGATION       Current Meds  Medication Sig  . aspirin 81 MG EC tablet Take 1 tablet (81 mg total) by mouth daily.  Marland Kitchen atorvastatin (LIPITOR) 40 MG tablet Take 1 tablet (40 mg total) by mouth  daily.  . clotrimazole-betamethasone (LOTRISONE) cream Apply to both feet and between toes bid x 4 weeks.  . enalapril (VASOTEC) 5 MG tablet Take 1 tablet (5 mg total) by mouth 2 (two) times daily.  . furosemide (LASIX) 40 MG tablet Take 1 tablet (40 mg total) by mouth 2 (two) times daily.  . metoprolol succinate (TOPROL-XL) 50 MG 24 hr tablet Take 1 tablet (50 mg total) by mouth daily. Take with or immediately following a meal.  . pregabalin (LYRICA) 75 MG capsule TAKE  1 CAPSULE IN THE MORNING, 1 CAPSULE IN THE EVENING AND 2 AT BEDTIME  . senna-docusate (SENOKOT-S) 8.6-50 MG tablet Take 2 tablets by mouth at bedtime as needed for mild constipation.     Allergies:   Loratadine-pseudoephedrine er   Social History   Tobacco Use  . Smoking status: Current Some Day Smoker    Types: Cigarettes    Last attempt to quit: 04/18/2013    Years since quitting: 6.0  . Smokeless tobacco: Current User  . Tobacco comment: 1-2 cigs in the a.m.  Substance Use Topics  . Alcohol use: No    Alcohol/week: 0.0 standard drinks  . Drug use: No     Family Hx: The patient's family history includes Diabetes in her mother; Heart disease in her mother; Prostate cancer in her father. There is no history of Neuropathy.  ROS:   Please see the history of present illness.    Constitutional: Negative for chills, fever, night sweats, unintentional weight loss  HENT: Negative for ear pain and hearing loss.   Eyes: Negative for loss of vision and eye pain.  Respiratory: Negative for cough, sputum, wheezing.   Cardiovascular: See HPI. Gastrointestinal: Negative for abdominal pain, melena, and hematochezia.  Genitourinary: Negative for dysuria and hematuria.  Musculoskeletal: Negative for falls and myalgias.  Skin: Negative for itching and rash.  Neurological: Negative for focal weakness, focal sensory changes and loss of consciousness.  Endo/Heme/Allergies: Does not bruise/bleed easily.  All other systems reviewed and are negative.   Prior CV studies:   The following studies were reviewed today: Prior echoes  Labs/Other Tests and Data Reviewed:    EKG:  An ECG dated 05/31/18 was personally reviewed today and demonstrated:  NSR  Recent Labs: 09/28/2018: BUN 9; Creatinine, Ser 0.58; Potassium 3.6; Sodium 144   Recent Lipid Panel Lab Results  Component Value Date/Time   CHOL 158 03/30/2018 04:05 PM   TRIG 75 03/30/2018 04:05 PM   HDL 43 03/30/2018 04:05 PM   CHOLHDL 3.7  03/30/2018 04:05 PM   CHOLHDL 2.6 02/27/2015 04:41 PM   LDLCALC 100 (H) 03/30/2018 04:05 PM    Wt Readings from Last 3 Encounters:  02/22/19 252 lb (114.3 kg)  09/28/18 233 lb 11.2 oz (106 kg)  09/27/18 234 lb (106.1 kg)     Objective:    Vital Signs:  There were no vitals taken for this visit.   GEN:  no acute distress EYES:  sclerae anicteric, EOMI - Extraocular Movements Intact RESPIRATORY:  normal respiratory effort, symmetric expansion CARDIOVASCULAR:  no visible JVD SKIN:  not well visualized today MUSCULOSKELETAL:  no change from prior baseline NEURO:  alert and oriented x 3, no obvious focal deficit PSYCH:  normal affect  ASSESSMENT & PLAN:    Chronic systolic heart failure: Asymptomatic today. -minimally active given her chronic MSK conditions -history of chronic LE edema with skin changes suggestive of venous stasis/venous insufficiency. Not well visualized today  -as previously, declines ischemic workup  as she is asymptomatic and was told it was normal in the past. Wishes to stay on current medications -on enalapril 5 mg BID, metoprolol succinate 50 mg daily, lasix 40 mg BID.  -HF education given on daily weights, salt/fluid restrictions -counseled on use of compression stockings -on aspirin 81 mg and atorvastatin 40 mg for prevention, tolerating. Ok to continue given unknown CAD status  Tobacco abuse counseling: The patient was counseled on tobacco cessation today for 4 minutes.  Counseling included reviewing the risks of smoking tobacco products, how it impacts the patient's current medical diagnoses and different strategies for quitting.  Pharmacotherapy to aid in tobacco cessation was not prescribed today.  Hypertension: no home working BP cuff. She will get batteries for her cuff and call us if they are elevated.  COVID-19 Education: The signs and symptoms of COVID-19 were discussed with the patient and how to seek care for testing (follow up with PCP or  arrange E-visit).  The importance of social distancing was discussed today.  Time:   Today, I have spent 15 minutes with the patient with telehealth technology discussing the above problems.     Medication Adjustments/Labs and Tests Ordered: Current medicines are reviewed at length with the patient today.  Concerns regarding medicines are outlined above.   Patient Instructions  Medication Instructions:  Your Physician recommend you continue on your current medication as directed.    If you need a refill on your cardiac medications before your next appointment, please call your pharmacy.   Lab work: None  Testing/Procedures: None  Follow-Up: At [facility], you and your health needs are our priority.  As part of our continuing mission to provide you with exceptional heart care, we have created designated Provider Care Teams.  These Care Teams include your primary Cardiologist (physician) and Advanced Practice Providers (APPs -  Physician Assistants and Nurse Practitioners) who all work together to provide you with the care you need, when you need it. You will need a follow up appointment in 6 months.  Please call our office 2 months in advance to schedule this appointment.  You may see Buford Dresser, MD or one of the following Advanced Practice Providers on your designated Care Team:   Rosaria Ferries, PA-C . Jory Sims, DNP, ANP      Signed, Buford Dresser, MD  04/27/2019 3:20 PM    [facility]

## 2019-04-27 NOTE — Patient Instructions (Signed)

## 2019-04-28 ENCOUNTER — Encounter: Payer: Self-pay | Admitting: Podiatry

## 2019-04-28 NOTE — Progress Notes (Signed)
Subjective: Julie Elliott presents today with h/o diabetes with painful, thick toenails 1-5 b/l that she cannot cut and which interfere with daily activities.  Pain is aggravated when wearing enclosed shoe gear.  She c/o ankle instability b/l which has been ongoing. Has also had injections in ankles.   Her son is present during the visit on today.  Mosetta Anis, MD is her PCP.  Current Outpatient Medications on File Prior to Visit  Medication Sig Dispense Refill  . aspirin 81 MG EC tablet Take 1 tablet (81 mg total) by mouth daily. 90 tablet 3  . atorvastatin (LIPITOR) 40 MG tablet Take 1 tablet (40 mg total) by mouth daily. 90 tablet 3  . enalapril (VASOTEC) 5 MG tablet Take 1 tablet (5 mg total) by mouth 2 (two) times daily. 120 tablet 3  . furosemide (LASIX) 40 MG tablet Take 1 tablet (40 mg total) by mouth 2 (two) times daily. 120 tablet 3  . metoprolol succinate (TOPROL-XL) 50 MG 24 hr tablet Take 1 tablet (50 mg total) by mouth daily. Take with or immediately following a meal. 90 tablet 3  . pregabalin (LYRICA) 75 MG capsule TAKE 1 CAPSULE IN THE MORNING, 1 CAPSULE IN THE EVENING AND 2 AT BEDTIME 120 capsule 6  . senna-docusate (SENOKOT-S) 8.6-50 MG tablet Take 2 tablets by mouth at bedtime as needed for mild constipation. 60 tablet 2   No current facility-administered medications on file prior to visit.     Allergies  Allergen Reactions  . Loratadine-Pseudoephedrine Er Hives    Claritin-D    Objective: Vascular Examination: Capillary refill time immediate x 10 digits.  Dorsalis pedis and Posterior tibial pulses palpable b/l.  Digital hair sparse x 10 digits.  Skin temperature gradient WNL b/l.  Trace edema b/l LE.   Dermatological Examination: Skin with normal turgor, texture and tone b/l.  No open wounds b/l.  Toenails 1-5 b/l discolored, thick, dystrophic with subungual debris and pain with palpation to nailbeds due to thickness of nails.  Diffuse scaling noted  peripherally and plantarly b/l feet with mild foot odor.  No interdigital macerations.  No blisters, no weeping. No signs of secondary bacterial infection noted.  Musculoskeletal: Muscle strength 5/5 to all LE muscle groups b/l.  Ligamentous laxity lateral aspect of both ankles.   Neurological: Sensation intact with 10 gram monofilament.  Vibratory sensation intact.  Assessment: Painful onychomycosis toenails 1-5 b/l  Tinea pedis b/l Ankle instability b/l   Plan: 1. Toenails 1-5 b/l were debrided in length and girth without iatrogenic bleeding. 2. For her ankle instability will consult Pedorthist for evaluation for bracing.  3. Rx was sent for Lotrisone Cream to be applied to both feet and between toes bid for 4 weeks. 4. Patient to continue soft, supportive shoe gear daily. 5. Patient to report any pedal injuries to medical professional immediately. 6. Follow up 3 months.  7. Patient/POA to call should there be a concern in the interim.

## 2019-05-04 ENCOUNTER — Ambulatory Visit
Admit: 2019-05-04 | Discharge: 2019-05-04 | Payer: PRIVATE HEALTH INSURANCE | Attending: Foot and Ankle Surgery | Primary: Registered Nurse

## 2019-05-04 ENCOUNTER — Ambulatory Visit: Attending: Foot and Ankle Surgery | Primary: Registered Nurse

## 2019-05-04 DIAGNOSIS — S92414D Nondisplaced fracture of proximal phalanx of right great toe, subsequent encounter for fracture with routine healing: Secondary | ICD-10-CM

## 2019-05-04 NOTE — Progress Notes (Signed)
 AMBULATORY PROGRESS NOTE      Patient: [name] Short             MRN: [id number]     SSN: [id number] Body mass index is 34.34 kg/m.  Date of Birth: 06-25-1956     AGE: 63 y.o.       EX: female    PCP: Dajao, Rogaciano M Jr., MD       IMPRESSION//  DIAGNOSIS AND TREATMENT PLAN      DIAGNOSES  1. Closed nondisplaced fracture of proximal phalanx of right great toe with routine healing, subsequent encounter    2. Right foot pain        Orders Placed This Encounter   . AMB POC XRAY, FOOT; COMPLETE, 3+ VIEW     ASK ALL FEMALE PATIENTS IF PREGNANT     Order Specific Question:   Reason for Exam     Answer:   PAIN   . HumaLOG  Mix 75-25 KwikPen flexpen     Sig: INJECT 30 UNITS BENEATH THE SKIN BEFORE BREAKFAST AND DINNER   . diclofenac EC (VOLTAREN) 25 mg EC tablet     Sig: Take  by mouth two (2) times a day.        Plan:    1. Discontinue hard sole shoe, wear comfortable tennis shoe.  2.  Continued knee high compression stockings for her known peripheral extremity edema:    RTO- prn as she is not having any pain.      X-rays three-view right foot, AP lateral, and oblique standard films nonweightbearing: Generalized osteopenia, signal throughout the metatarsals, no acute fracture seen.  Is a healed fracture to the great toe.  No signs of Charcot neuroarthropathy.  No subluxation dislocation, to the midfoot.  There is soft tissue swelling, seen to her foot, dorsal part of her distal one third forefoot, which she is had in the past:          PLAN  HPI //  OBJECTIVE EXAMINATION     [name] Short IS A 63 y.o. female who presents to my outpatient office for follow up evaluation NW:[id number] nondisplaced fracture of proximal phalanx of right great toe. At the last OV, I placed the patient into a hard sole shoe.   Injury date Dec 03, 2018.     Since the last OV, [name] Short is not having any major pain in her foot. She states that she banged her #2 toe on a storm door recently. Clinically she is non-tender. She does mention that she fell  recently, hitting her head.The patient has h/o of diabetes.             Visit Vitals  BP (!) 144/71 (BP 1 Location: Left arm, BP Patient Position: Sitting)   Pulse 85   Temp 97.8 F (36.6 C)   Resp 16   Ht 4\' 11"  (1.499 m)   Wt 170 lb (77.1 kg)   BMI 34.34 kg/m         Appearance: Alert, well appearing and pleasant patient who is in no distress, oriented to person, place/time, and who follows commands.   Psychiatric: Affect and mood are appropriate.      Hearings Intact, does not require hearing aid device   Respiratory: Breathing is unlabored without accessory chest muscle use  Cardiovascular/Peripheral Vascular: Normal Pulses to each hand and foot       ANKLE/FOOT right    Psychiatry: Alert, Oriented x 3; Speech normal in context and clarity,  Memory intact grossly, no involuntary movements - tremors, no dementia  Gait: slow  Tenderness: No tenderness is present to the right great toe distal phalanx and proximal phalanx near the IP joint region.  Cutaneous: slight right great toe  Joint Motion: WNL   Joint /Tendon Stability: Stability Testing: Peroneal tendon instability : not present   Instability of ankle not present, Subtalar instability not present   Alignment: neutral Hindfoot, none Metatarsus Adductus Metatarsus.  Neuro Motor/Sensory: NL/NL, Vascular: NL foot/ankle pulses 1+ pitting of foot into lower leg  Lymphatics: No extremity lymphedema, No calf swelling, no tenderness to calf muscles.            CHART REVIEW     [name] Short has been experiencing pain and discomfort confirmed as outlined in the pain assessment outlined below.      Pain Assessment  05/04/2019   Location of Pain Toe   Location Modifiers Right   Severity of Pain 0   Quality of Pain -   Duration of Pain -   Frequency of Pain -   Aggravating Factors -   Relieving Factors -   Result of Injury -   Work-Related Injury -   Type of Injury -        [name] Short  has a past medical history of Anemia, Arthritis, Diabetes (HCC), History of  colon polyps, HTN (hypertension), Hyperlipemia, and Stroke (HCC).     Patients is employed at:       Lab Results   Component Value Date/Time    Hemoglobin A1c 5.1 12/21/2016 05:57 AM    Hemoglobin A1c 6.1 (H) 11/25/2015 10:25 AM    Hemoglobin A1c 7.3 (H) 04/24/2013 06:50 AM        Lab Results   Component Value Date/Time    Glucose 134 (H) 04/22/2018 08:55 AM    Glucose (POC) 103 01/13/2017 12:35 PM        Lab Results   Component Value Date/Time    Hemoglobin A1c 5.1 12/21/2016 05:57 AM         No results found for: VITD3, XQVID2, XQVID3, XQVID, VD3RIA, VITD25OHEXT        Past Medical History:   Diagnosis Date   . Anemia    . Arthritis    . Diabetes (HCC)    . History of colon polyps    . HTN (hypertension)    . Hyperlipemia    . Stroke [facility])      Current Outpatient Medications   Medication Sig   . HumaLOG  Mix 75-25 KwikPen flexpen INJECT 30 UNITS BENEATH THE SKIN BEFORE BREAKFAST AND DINNER   . diclofenac EC (VOLTAREN) 25 mg EC tablet Take  by mouth two (2) times a day.   [name] Short ascorbic acid, vitamin C, (VITAMIN C) 250 mg tablet Take 1 Tab by mouth two (2) times daily (with meals).   . ferrous sulfate  325 mg (65 mg iron ) tablet Take 1 Tab by mouth two (2) times daily (with meals).   . pantoprazole (PROTONIX) 40 mg tablet Take 1 Tab by mouth daily.   . metOLazone (ZAROXOLYN) 5 mg tablet Take 2.5 mg by mouth Three (3) times a week.   . furosemide  (LASIX ) 20 mg tablet Take 20 mg by mouth daily.   [name] Short EXENATIDE MICROSPHERES (BYDUREON SC) by SubCUTAneous route every seven (7) days.   . cholecalciferol (VITAMIN D3) 1,000 unit tablet Take 1 tablet by mouth daily.   . atorvastatin  (LIPITOR ) 40 mg tablet Take 1 tablet by mouth  nightly.   . clopidogrel  (PLAVIX ) 75 mg tablet Take 1 tablet by mouth daily.   . lisinopril  (PRINIVIL , ZESTRIL ) 5 mg tablet Take 1 tablet by mouth daily.   . multivitamin (ONE A DAY) tablet Take 1 Tab by mouth daily.   . insulin  aspart protamine/insulin  aspart (NOVOLOG MIX 70-30 FLEXPEN) 100 unit/mL (70-30)  flex pen 30 units SQ every morning and 25 units SQ every evening     No current facility-administered medications for this visit.      THE PMP FOR [facility]  WAS REVIEWED BY [name] [place], MD 05/04/2019 .   No Known Allergies  Past Surgical History:   Procedure Laterality Date   . COLONOSCOPY N/A 01/13/2017    COLONOSCOPY performed by [name] Kerbs, MD at [facility]   . HX CESAREAN SECTION  1987   . HX COLONOSCOPY     . HX DILATION AND CURETTAGE  1995     Social History     Occupational History   . Not on file   Tobacco Use   . Smoking status: Current Every Day Smoker   . Smokeless tobacco: Never Used   . Tobacco comment: patient state that she smokes 1 black&mild /daily   Substance and Sexual Activity   . Alcohol use: No   . Drug use: No   . Sexual activity: Never     Family History   Problem Relation Age of Onset   . Hypertension Other 35        parent,NOS   . Heart Disease Other 62        parent, NOS   . Diabetes Other 45        parent, NOS   . Cancer Father         REVIEW OF SYSTEMS : 05/04/2019  ALL BELOW ARE Negative except : SEE HPI     Constitutional: Negative for fever, chills and weight loss. Neg Weight Loss  Cardiovascular: Negative for chest pain, claudication and leg swelling. SOB, DOE   Gastrointestinal/Urological: Negative for pain, N/V/D/C, Blood in stool or urine,dysuria,  Hematuria, Incontinence  Musculoskeletal: see HPI.  Neurological: Negative for dizziness and weakness, headaches,Visual Changes or   Confusion, or Seizures,   Psychiatric/Behavioral: Negative for depression, memory loss and substance abuse.   Extremities:  Negative for hair changes, rash or skin lesion changes.  Hematologic: Negative for Bleeding problems, bruising, pallor or swollen lymph nodes.  Peripheral Vascular: No calf pain, vascular vein tenderness to calf pain              No calf throbbing, posterior knee throbbing pain     DIAGNOSTIC IMAGING      [facility]: INTERPRETATION BY RADIOLOGIST     XR Results  (most recent):  Results from Hospital Encounter encounter on 12/03/18   XR FOOT RT MIN 3 V    Narrative Right Foot Complete    History: Trauma, pain    Technique: AP, Lateral, and oblique views of the foot were obtained.    Comparison: 3/14    Findings:     Lucency at the medial edge of the distal phalange base first digit with  intra-articular extension likely representing a nondisplaced fracture. Very  subtle vertical lucency proximal phalanx, may represent a vascular channel  versus additional nondisplaced fracture. Mild adjacent contusion. Small plantar  calcaneal spur.      Impression Impression:    Nondisplaced fracture distal phalanx base digit.  -Additional subtle vertical lucency proximal phalange as  well.          I have personally reviewed the results of the above study. The interpretation of this study is my professional opinion.      [name] [place], MD  05/04/2019  8:47 AM

## 2019-05-04 NOTE — Progress Notes (Signed)
AMBULATORY PROGRESS NOTE      Patient: [name] Short             MRN: [id number]     SSN: [id number] Body mass index is 34.34 kg/m??.  Date of Birth: 15-May-1956     AGE: 63 y.o.       EX: female    PCP: Marissa Nestleajao, Rogaciano M Jr., MD       IMPRESSION//  DIAGNOSIS AND TREATMENT PLAN      DIAGNOSES  1. Closed nondisplaced fracture of proximal phalanx of right great toe with routine healing, subsequent encounter    2. Right foot pain        Orders Placed This Encounter   ??? AMB POC XRAY, FOOT; COMPLETE, 3+ VIEW     ASK ALL FEMALE PATIENTS IF PREGNANT     Order Specific Question:   Reason for Exam     Answer:   PAIN   ??? HumaLOG Mix 75-25 KwikPen flexpen     Sig: INJECT 30 UNITS BENEATH THE SKIN BEFORE BREAKFAST AND DINNER   ??? diclofenac EC (VOLTAREN) 25 mg EC tablet     Sig: Take  by mouth two (2) times a day.        Plan:    1. Discontinue hard sole shoe, wear comfortable tennis shoe.  2.  Continued knee high compression stockings for her known peripheral extremity edema:    RTO- prn as she is not having any pain.      X-rays three-view right foot, AP lateral, and oblique standard films nonweightbearing: Generalized osteopenia, signal throughout the metatarsals, no acute fracture seen.  Is a healed fracture to the great toe.  No signs of Charcot neuroarthropathy.  No subluxation dislocation, to the midfoot.  There is soft tissue swelling, seen to her foot, dorsal part of her distal one third forefoot, which she is had in the past:          PLAN  HPI //  OBJECTIVE EXAMINATION     [name] AppleHelena Jaworski IS A 63 y.o. female who presents to my outpatient office for follow up evaluation QM:VHQIONof:Closed nondisplaced fracture of proximal phalanx of right great toe. At the last OV, I placed the patient into a hard sole shoe.   Injury date Dec 03, 2018.     Since the last OV, [name] AppleHelena Shabazz is not having any major pain in her foot. She states that she banged her #2 toe on a storm door recently. Clinically  she is non-tender. She does mention that she fell recently, hitting her head.The patient has h/o of diabetes.             Visit Vitals  BP (!) 144/71 (BP 1 Location: Left arm, BP Patient Position: Sitting)   Pulse 85   Temp 97.8 ??F (36.6 ??C)   Resp 16   Ht 4\' 11"  (1.499 m)   Wt 170 lb (77.1 kg)   BMI 34.34 kg/m??         Appearance: Alert, well appearing and pleasant patient who is in no distress, oriented to person, place/time, and who follows commands.   Psychiatric: Affect and mood are appropriate.      Hearings Intact, does not require hearing aid device   Respiratory: Breathing is unlabored without accessory chest muscle use  Cardiovascular/Peripheral Vascular: Normal Pulses to each hand and foot       ANKLE/FOOT right    Psychiatry: Alert, Oriented x 3; Speech normal in context and clarity,  Memory intact grossly, no involuntary movements - tremors, no dementia  Gait: slow  Tenderness: No tenderness is present to the right great toe distal phalanx and proximal phalanx near the IP joint region.  Cutaneous: slight right great toe  Joint Motion: WNL   Joint /Tendon Stability: Stability Testing: Peroneal tendon instability : not present   Instability of ankle not present, Subtalar instability not present   Alignment: neutral Hindfoot, none Metatarsus Adductus Metatarsus.  Neuro Motor/Sensory: NL/NL, Vascular: NL foot/ankle pulses 1+ pitting of foot into lower leg  Lymphatics: No extremity lymphedema, No calf swelling, no tenderness to calf muscles.            CHART REVIEW     Tatelyn Vanhecke has been experiencing pain and discomfort confirmed as outlined in the pain assessment outlined below.      Pain Assessment  05/04/2019   Location of Pain Toe   Location Modifiers Right   Severity of Pain 0   Quality of Pain -   Duration of Pain -   Frequency of Pain -   Aggravating Factors -   Relieving Factors -   Result of Injury -   Work-Related Injury -   Type of Injury -         Emely Fahy  has a past medical history of Anemia, Arthritis, Diabetes (HCC), History of colon polyps, HTN (hypertension), Hyperlipemia, and Stroke (HCC).     Patients is employed at:       Lab Results   Component Value Date/Time    Hemoglobin A1c 5.1 12/21/2016 05:57 AM    Hemoglobin A1c 6.1 (H) 11/25/2015 10:25 AM    Hemoglobin A1c 7.3 (H) 04/24/2013 06:50 AM        Lab Results   Component Value Date/Time    Glucose 134 (H) 04/22/2018 08:55 AM    Glucose (POC) 103 01/13/2017 12:35 PM        Lab Results   Component Value Date/Time    Hemoglobin A1c 5.1 12/21/2016 05:57 AM         No results found for: VITD3, Esperanza Sheets, VD3RIA, VITD25OHEXT        Past Medical History:   Diagnosis Date   ??? Anemia    ??? Arthritis    ??? Diabetes (HCC)    ??? History of colon polyps    ??? HTN (hypertension)    ??? Hyperlipemia    ??? Stroke [facility])      Current Outpatient Medications   Medication Sig   ??? HumaLOG Mix 75-25 KwikPen flexpen INJECT 30 UNITS BENEATH THE SKIN BEFORE BREAKFAST AND DINNER   ??? diclofenac EC (VOLTAREN) 25 mg EC tablet Take  by mouth two (2) times a day.   ??? ascorbic acid, vitamin C, (VITAMIN C) 250 mg tablet Take 1 Tab by mouth two (2) times daily (with meals).   ??? ferrous sulfate 325 mg (65 mg iron) tablet Take 1 Tab by mouth two (2) times daily (with meals).   ??? pantoprazole (PROTONIX) 40 mg tablet Take 1 Tab by mouth daily.   ??? metOLazone (ZAROXOLYN) 5 mg tablet Take 2.5 mg by mouth Three (3) times a week.   ??? furosemide (LASIX) 20 mg tablet Take 20 mg by mouth daily.   ??? EXENATIDE MICROSPHERES (BYDUREON SC) by SubCUTAneous route every seven (7) days.   ??? cholecalciferol (VITAMIN D3) 1,000 unit tablet Take 1 tablet by mouth daily.   ??? atorvastatin (LIPITOR) 40 mg tablet Take 1 tablet by mouth  nightly.   ??? clopidogrel (PLAVIX) 75 mg tablet Take 1 tablet by mouth daily.   ??? lisinopril (PRINIVIL, ZESTRIL) 5 mg tablet Take 1 tablet by mouth daily.   ??? multivitamin (ONE A DAY) tablet Take 1 Tab by mouth daily.    ??? insulin aspart protamine/insulin aspart (NOVOLOG MIX 70-30 FLEXPEN) 100 unit/mL (70-30) flex pen 30 units SQ every morning and 25 units SQ every evening     No current facility-administered medications for this visit.      THE PMP FOR [facility]  WAS REVIEWED BY Teressa Lower, MD 05/04/2019 .   No Known Allergies  Past Surgical History:   Procedure Laterality Date   ??? COLONOSCOPY N/A 01/13/2017    COLONOSCOPY performed by Joycelyn Schmid, MD at [facility]   ??? HX [place]   ??? HX COLONOSCOPY     ??? HX [place]     Social History     Occupational History   ??? Not on file   Tobacco Use   ??? Smoking status: Current Every Day Smoker   ??? Smokeless tobacco: Never Used   ??? Tobacco comment: patient state that she smokes 1 black&mild /daily   Substance and Sexual Activity   ??? Alcohol use: No   ??? Drug use: No   ??? Sexual activity: Never     Family History   Problem Relation Age of Onset   ??? Hypertension Other 35        parent,NOS   ??? Heart Disease Other 62        parent, NOS   ??? Diabetes Other 73        parent, NOS   ??? Cancer Father         REVIEW OF SYSTEMS : 05/04/2019  ALL BELOW ARE Negative except : SEE HPI     Constitutional: Negative for fever, chills and weight loss. Neg Weight Loss  Cardiovascular: Negative for chest pain, claudication and leg swelling. SOB, DOE   Gastrointestinal/Urological: Negative for pain, N/V/D/C, Blood in stool or urine,dysuria,  Hematuria, Incontinence  Musculoskeletal: see HPI.  Neurological: Negative for dizziness and weakness, headaches,Visual Changes or   Confusion, or Seizures,   Psychiatric/Behavioral: Negative for depression, memory loss and substance abuse.   Extremities:  Negative for hair changes, rash or skin lesion changes.  Hematologic: Negative for Bleeding problems, bruising, pallor or swollen lymph nodes.  Peripheral Vascular: No calf pain, vascular vein tenderness to calf pain              No calf throbbing, posterior knee throbbing pain      DIAGNOSTIC IMAGING      [facility]: INTERPRETATION BY RADIOLOGIST     XR Results (most recent):  Results from Hospital Encounter encounter on 12/03/18   XR FOOT RT MIN 3 V    Narrative Right Foot Complete    History: Trauma, pain    Technique: AP, Lateral, and oblique views of the foot were obtained.    Comparison: 3/14    Findings:     Lucency at the medial edge of the distal phalange base first digit with  intra-articular extension likely representing a nondisplaced fracture. Very  subtle vertical lucency proximal phalanx, may represent a vascular channel  versus additional nondisplaced fracture. Mild adjacent contusion. Small plantar  calcaneal spur.      Impression Impression:    Nondisplaced fracture distal phalanx base digit.  -Additional subtle vertical lucency proximal phalange as  well.          I have personally reviewed the results of the above study. The interpretation of this study is my professional opinion.      Netta Corrigan, MD  05/04/2019  8:47 AM

## 2019-05-23 ENCOUNTER — Other Ambulatory Visit: Payer: Medicare Other | Admitting: Orthotics

## 2019-05-23 ENCOUNTER — Other Ambulatory Visit: Payer: Self-pay

## 2019-06-02 ENCOUNTER — Other Ambulatory Visit: Payer: Self-pay | Admitting: Internal Medicine

## 2019-06-02 DIAGNOSIS — I5042 Chronic combined systolic (congestive) and diastolic (congestive) heart failure: Secondary | ICD-10-CM

## 2019-06-21 ENCOUNTER — Other Ambulatory Visit: Payer: Self-pay

## 2019-06-21 ENCOUNTER — Encounter: Payer: Self-pay | Admitting: Internal Medicine

## 2019-06-21 ENCOUNTER — Ambulatory Visit (INDEPENDENT_AMBULATORY_CARE_PROVIDER_SITE_OTHER): Payer: Medicare Other | Admitting: Internal Medicine

## 2019-06-21 VITALS — BP 127/105 | HR 78 | Temp 98.5°F | Ht 63.0 in | Wt 250.0 lb

## 2019-06-21 DIAGNOSIS — I10 Essential (primary) hypertension: Secondary | ICD-10-CM

## 2019-06-21 DIAGNOSIS — I5042 Chronic combined systolic (congestive) and diastolic (congestive) heart failure: Secondary | ICD-10-CM | POA: Diagnosis not present

## 2019-06-21 MED ORDER — SPIRONOLACTONE 25 MG PO TABS: 25.0000 mg | ORAL_TABLET | Freq: Every day | ORAL | 0 refills | Status: DC

## 2019-06-21 NOTE — Progress Notes (Signed)
CC: Heart Failure with reduced ejection fraction  HPI: [name] is a 63 y.o. 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 She states that her legs have been slowly getting larger and she also has been gaining weight. She continues to experience dyspnea with exertion and orthopnea and sleeps on a recliner. She denies any significant chest pain, palpitations, dyspnea at rest. She mentions that on her prior visit Dr.Christopher recommended Entresto but she refused due to 'not liking change in my meds.' She also mentions disliking her diuretic regimen due to continued need to go to the bathroom.  Past Medical History:  Diagnosis Date   CHF (congestive heart failure) (HCC)    Chronic headaches    DJD (degenerative joint disease)    DM (diabetes mellitus) ([place]) 2008   stable HgBA1C at 6.5   GERD (gastroesophageal reflux disease)    well controlled on Omeprazole   HLD (hyperlipidemia) 2008   stable, well controlled   Hypertension    well controlled   Review of Systems: Review of Systems  Constitutional: Negative for chills, fever and malaise/fatigue.  Eyes: Negative for blurred vision.  Respiratory: Negative for cough and shortness of breath.   Cardiovascular: Positive for leg swelling. Negative for chest pain and palpitations.  Gastrointestinal: Negative for constipation, diarrhea, nausea and vomiting.  Genitourinary: Positive for frequency.  Neurological: Negative for dizziness.  All other systems reviewed and are negative.   Physical Exam: Vitals:   06/21/19 1536  BP: (!) 127/105  Pulse: 78  Temp: 98.5 F (36.9 C)  TempSrc: Oral  SpO2: 99%  Weight: 250 lb (113.4 kg)  Height: 5\' 3"  (1.6 m)   Physical Exam  Constitutional: She is oriented to person, place, and time. She appears well-developed.  Obese  Cardiovascular: Normal rate, regular  rhythm, normal heart sounds and intact distal pulses.  No murmur heard. Respiratory: Effort normal. She has no wheezes. She has rales (bibasilar rales).  GI: Soft. Bowel sounds are normal. She exhibits no distension. There is no abdominal tenderness.  Musculoskeletal: Normal range of motion.        General: Edema (2+ pitting edema up to lower thighs) present.  Neurological: She is alert and oriented to person, place, and time.  Stable left sided upper extremity paresthesia  Skin: Skin is warm and dry.    Assessment & Plan:   Combined systolic and diastolic congestive heart failure (HCC) Presents for f/u management of her CHF. Since her last visit, her weight has increased to 250lbs from 233lbs. She continues to endorse orthopnea and dyspnea with exertion but no dyspnea at rest. Mentions that she would like to decrease furosemide frequency due to urinary frequency as she continues to have good urianry output. Discussed with Julie Elliott that with recurrent hypokalemia and worsening hypervolemia she would benefit of addition of aldosterone antagonist in addition to her current regimen. Julie Elliott expressed understanding.  - Start spironolactone 25mg  daily - likely will need uptitration - C/w furosemide 40mg  BID, metoprolol 50mg  daily, enalapril 5mg  BID - F/u in 4 weeks  Essential hypertension BP Readings from Last 3 Encounters:  06/21/19 (!) 127/105  02/22/19 136/89  10/07/18 111/69   Bp slightly above goal but acceptable. Denies any significant chest pain, palpitations, headache, blurry vision. Discussed importance of avoiding salt intake. Son who was present at the visit mentions significant dietary indiscretion. Discussed importance of DASH diet. Julie Elliott expressed understanding.  -  C/w enalapril 5mg  BID, metoprolol succinate 50mg  daily - BMP today    Patient discussed with Dr. Rebeca Alert   -Gilberto Better, PGY2 [facility] Pager: [phone]

## 2019-06-21 NOTE — Patient Instructions (Addendum)
Dear [name],  Thank you for allowing [place] to provide your care today. Today we discussed your leg swelling    I have ordered bmp labs for you. I will call if any are abnormal.    Today we made the following changes to your medications:    Please start spironolactone 25mg  daily  Please follow-up in 4 weeks for lab visit.    Should you have any questions or concerns please call the internal medicine clinic at [phone].    Thank you for choosing [facility].  Spironolactone tablets What is this medicine? SPIRONOLACTONE (speer on oh LAK tone) is a diuretic. It helps you make more urine and to lose excess water from your body. This medicine is used to treat high blood pressure, and edema or swelling from heart, kidney, or liver disease. It is also used to treat patients who make too much aldosterone or have low potassium. This medicine may be used for other purposes; ask your health care provider or pharmacist if you have questions. COMMON BRAND NAME(S): Aldactone What should I tell my health care provider before I take this medicine? They need to know if you have any of these conditions:  high blood level of potassium  kidney disease or trouble making urine  liver disease  an unusual or allergic reaction to spironolactone, other medicines, foods, dyes, or preservatives  pregnant or trying to get pregnant  breast-feeding How should I use this medicine? Take this medicine by mouth with a drink of water. Follow the directions on your prescription label. You can take it with or without food. If it upsets your stomach, take it with food. Do not take your medicine more often than directed. Remember that you will need to pass more urine after taking this medicine. Do not take your doses at a time of day that will cause you problems. Do not take at bedtime. Talk to your pediatrician regarding the use of this medicine in children. While this drug may be prescribed for selected  conditions, precautions do apply. Overdosage: If you think you have taken too much of this medicine contact a poison control center or emergency room at once. NOTE: This medicine is only for you. Do not share this medicine with others. What if I miss a dose? If you miss a dose, take it as soon as you can. If it is almost time for your next dose, take only that dose. Do not take double or extra doses. What may interact with this medicine? Do not take this medicine with any of the following medications:  cidofovir  eplerenone  tranylcypromine This medicine may also interact with the following medications:  aspirin  certain medicines for blood pressure or heart disease like benazepril, lisinopril, losartan, valsartan  certain medicines that treat or prevent blood clots like heparin and enoxaparin  cholestyramine  cyclosporine  digoxin  lithium  medicines that relax muscles for surgery  NSAIDs, medicines for pain and inflammation, like ibuprofen or naproxen  other diuretics  potassium supplements  steroid medicines like prednisone or cortisone  trimethoprim This list may not describe all possible interactions. Give your health care provider a list of all the medicines, herbs, non-prescription drugs, or dietary supplements you use. Also tell them if you smoke, drink alcohol, or use illegal drugs. Some items may interact with your medicine. What should I watch for while using this medicine? Visit your doctor or health care professional for regular checks on your progress. Check your blood pressure as  directed. Ask your doctor what your blood pressure should be, and when you should contact them. You may need to be on a special diet while taking this medicine. Ask your doctor. Also, ask how many glasses of fluid you need to drink a day. You must not get dehydrated. This medicine may make you feel confused, dizzy or lightheaded. Drinking alcohol and taking some medicines can make  this worse. Do not drive, use machinery, or do anything that needs mental alertness until you know how this medicine affects you. Do not sit or stand up quickly. What side effects may I notice from receiving this medicine? Side effects that you should report to your doctor or health care professional as soon as possible:  allergic reactions such as skin rash or itching, hives, swelling of the lips, mouth, tongue, or throat  black or tarry stools  fast, irregular heartbeat  fever  muscle pain, cramps  numbness, tingling in hands or feet  trouble breathing  trouble passing urine  unusual bleeding  unusually weak or tired Side effects that usually do not require medical attention (report to your doctor or health care professional if they continue or are bothersome):  change in voice or hair growth  confusion  dizzy, drowsy  dry mouth, increased thirst  enlarged or tender breasts  headache  irregular menstrual periods  sexual difficulty, unable to have an erection  stomach upset This list may not describe all possible side effects. Call your doctor for medical advice about side effects. You may report side effects to FDA at 1-800-FDA-1088. Where should I keep my medicine? Keep out of the reach of children. Store below 25 degrees C (77 degrees F). Throw away any unused medicine after the expiration date. NOTE: This sheet is a summary. It may not cover all possible information. If you have questions about this medicine, talk to your doctor, pharmacist, or health care provider.  2020 Elsevier/Gold Standard (2016-12-05 09:42:28)

## 2019-06-22 ENCOUNTER — Encounter

## 2019-06-22 ENCOUNTER — Inpatient Hospital Stay: Admit: 2019-06-22 | Payer: PRIVATE HEALTH INSURANCE | Attending: Family Medicine | Primary: Registered Nurse

## 2019-06-22 DIAGNOSIS — Z1231 Encounter for screening mammogram for malignant neoplasm of breast: Secondary | ICD-10-CM

## 2019-06-22 LAB — BMP8+ANION GAP
Anion Gap: 15 mmol/L (ref 10.0–18.0)
BUN/Creatinine Ratio: 9 — ABNORMAL LOW (ref 12–28)
BUN: 5 mg/dL — ABNORMAL LOW (ref 8–27)
CO2: 26 mmol/L (ref 20–29)
Calcium: 8.1 mg/dL — ABNORMAL LOW (ref 8.7–10.3)
Chloride: 102 mmol/L (ref 96–106)
Creatinine, Ser: 0.57 mg/dL (ref 0.57–1.00)
GFR calc Af Amer: 116 mL/min/{1.73_m2} (ref >59)
GFR calc non Af Amer: 100 mL/min/{1.73_m2} (ref >59)
Glucose: 206 mg/dL — ABNORMAL HIGH (ref 65–99)
Potassium: 3.1 mmol/L — ABNORMAL LOW (ref 3.5–5.2)
Sodium: 143 mmol/L (ref 134–144)

## 2019-06-23 ENCOUNTER — Encounter: Payer: Self-pay | Admitting: Internal Medicine

## 2019-06-23 NOTE — Assessment & Plan Note (Signed)
Presents for f/u management of her CHF. Since her last visit, her weight has increased to 250lbs from 233lbs. She continues to endorse orthopnea and dyspnea with exertion but no dyspnea at rest. Mentions that she would like to decrease furosemide frequency due to urinary frequency as she continues to have good urianry output. Discussed with Julie Elliott that with recurrent hypokalemia and worsening hypervolemia she would benefit of addition of aldosterone antagonist in addition to her current regimen. Julie Elliott expressed understanding.  - Start spironolactone 25mg  daily - likely will need uptitration - C/w furosemide 40mg  BID, metoprolol 50mg  daily, enalapril 5mg  BID - F/u in 4 weeks

## 2019-06-23 NOTE — Assessment & Plan Note (Signed)
BP Readings from Last 3 Encounters:  06/21/19 (!) 127/105  02/22/19 136/89  10/07/18 111/69   Bp slightly above goal but acceptable. Denies any significant chest pain, palpitations, headache, blurry vision. Discussed importance of avoiding salt intake. Son who was present at the visit mentions significant dietary indiscretion. Discussed importance of DASH diet. Julie Elliott expressed understanding.  - C/w enalapril 5mg  BID, metoprolol succinate 50mg  daily - BMP today

## 2019-06-24 NOTE — Progress Notes (Signed)
Internal Medicine Clinic Attending  Case discussed with Dr. Lee at the time of the visit.  We reviewed the resident's history and exam and pertinent patient test results.  I agree with the assessment, diagnosis, and plan of care documented in the resident's note.  Alexander Raines, M.D., Ph.D.  

## 2019-06-27 ENCOUNTER — Telehealth: Payer: Self-pay | Admitting: Internal Medicine

## 2019-06-27 NOTE — Telephone Encounter (Signed)
Spoke with Ms.Stringfellow regarding her lab results, including her hypokalemia. She mentions that she has started her spironolactone and is taking it as prescribed. Discussed with Ms.Rhem regarding dietary indiscretion during Thanksgiving season and advised to reduce sodium intake and increase potassium intake. Ms.Lipsky expressed understanding.

## 2019-07-19 ENCOUNTER — Other Ambulatory Visit: Payer: Medicare Other

## 2019-07-20 ENCOUNTER — Other Ambulatory Visit: Payer: Self-pay

## 2019-07-20 ENCOUNTER — Encounter: Payer: Self-pay | Admitting: Podiatry

## 2019-07-20 ENCOUNTER — Ambulatory Visit (INDEPENDENT_AMBULATORY_CARE_PROVIDER_SITE_OTHER): Payer: Medicare Other | Admitting: Podiatry

## 2019-07-20 ENCOUNTER — Other Ambulatory Visit: Payer: Self-pay | Admitting: Internal Medicine

## 2019-07-20 DIAGNOSIS — B351 Tinea unguium: Secondary | ICD-10-CM | POA: Diagnosis not present

## 2019-07-20 DIAGNOSIS — M79675 Pain in left toe(s): Secondary | ICD-10-CM

## 2019-07-20 DIAGNOSIS — M79674 Pain in right toe(s): Secondary | ICD-10-CM

## 2019-07-20 DIAGNOSIS — B353 Tinea pedis: Secondary | ICD-10-CM | POA: Diagnosis not present

## 2019-07-20 DIAGNOSIS — I5042 Chronic combined systolic (congestive) and diastolic (congestive) heart failure: Secondary | ICD-10-CM

## 2019-07-20 MED ORDER — TRIAMCINOLONE ACETONIDE 0.1 % EX CREA: 1.0000 "application " | TOPICAL_CREAM | Freq: Two times a day (BID) | CUTANEOUS | 1 refills | Status: DC

## 2019-07-20 MED ORDER — TOLNAFTATE 1 % EX AERP: INHALATION_SPRAY | Freq: Two times a day (BID) | CUTANEOUS | 3 refills | Status: DC

## 2019-07-20 MED ORDER — CICLOPIROX OLAMINE 0.77 % EX CREA: TOPICAL_CREAM | CUTANEOUS | 1 refills | Status: DC

## 2019-07-20 NOTE — Patient Instructions (Signed)
Athlete's Foot  Athlete's foot (tinea pedis) is a fungal infection of the skin on your feet. It often occurs on the skin that is between or underneath the toes. It can also occur on the soles of your feet. The infection can spread from person to person (is contagious). It can also spread when a person's bare feet come in contact with the fungus on shower floors or on items such as shoes. What are the causes? This condition is caused by a fungus that grows in warm, moist places. You can get athlete's foot by sharing shoes, shower stalls, towels, and wet floors with someone who is infected. Not washing your feet or changing your socks often enough can also lead to athlete's foot. What increases the risk? This condition is more likely to develop in:  Men.  People who have a weak body defense system (immune system).  People who have diabetes.  People who use public showers, such as at a gym.  People who wear heavy-duty shoes, such as industrial or military shoes.  Seasons with warm, humid weather. What are the signs or symptoms? Symptoms of this condition include:  Itchy areas between your toes or on the soles of your feet.  White, flaky, or scaly areas between your toes or on the soles of your feet.  Very itchy small blisters between your toes or on the soles of your feet.  Small cuts in your skin. These cuts can become infected.  Thick or discolored toenails. How is this diagnosed? This condition may be diagnosed with a physical exam and a review of your medical history. Your health care provider may also take a skin or toenail sample to examine under a microscope. How is this treated? This condition is treated with antifungal medicines. These may be applied as powders, ointments, or creams. In severe cases, an oral antifungal medicine may be given. Follow these instructions at home: Medicines  Apply or take over-the-counter and prescription medicines only as told by your health  care provider.  Apply your antifungal medicine as told by your health care provider. Do not stop using the antifungal even if your condition improves. Foot care  Do not scratch your feet.  Keep your feet dry: ? Wear cotton or wool socks. Change your socks every day or if they become wet. ? Wear shoes that allow air to flow, such as sandals or canvas tennis shoes.  Wash and dry your feet, including the area between your toes. Also, wash and dry your feet: ? Every day or as told by your health care provider. ? After exercising. General instructions  Do not let others use towels, shoes, nail clippers, or other personal items that touch your feet.  Protect your feet by wearing sandals in wet areas, such as locker rooms and shared showers.  Keep all follow-up visits as told by your health care provider. This is important.  If you have diabetes, keep your blood sugar under control. Contact a health care provider if:  You have a fever.  You have swelling, soreness, warmth, or redness in your foot.  Your feet are not getting better with treatment.  Your symptoms get worse.  You have new symptoms. Summary  Athlete's foot (tinea pedis) is a fungal infection of the skin on your feet. It often occurs on skin that is between or underneath the toes.  This condition is caused by a fungus that grows in warm, moist places.  Symptoms include white, flaky, or scaly areas between   your toes or on the soles of your feet.  This condition is treated with antifungal medicines.  Keep your feet clean. Always dry them thoroughly. This information is not intended to replace advice given to you by your health care provider. Make sure you discuss any questions you have with your health care provider. Document Released: 07/18/2000 Document Revised: 07/16/2017 Document Reviewed: 05/11/2017 Elsevier Patient Education  2020 [employer].

## 2019-07-21 ENCOUNTER — Other Ambulatory Visit: Payer: Medicare Other

## 2019-07-21 ENCOUNTER — Telehealth: Payer: Self-pay | Admitting: Internal Medicine

## 2019-07-21 ENCOUNTER — Ambulatory Visit (INDEPENDENT_AMBULATORY_CARE_PROVIDER_SITE_OTHER): Payer: Medicare Other | Admitting: Orthotics

## 2019-07-21 DIAGNOSIS — R2681 Unsteadiness on feet: Secondary | ICD-10-CM | POA: Diagnosis not present

## 2019-07-21 DIAGNOSIS — M25371 Other instability, right ankle: Secondary | ICD-10-CM

## 2019-07-21 DIAGNOSIS — M25372 Other instability, left ankle: Secondary | ICD-10-CM

## 2019-07-21 DIAGNOSIS — M25373 Other instability, unspecified ankle: Secondary | ICD-10-CM

## 2019-07-21 NOTE — Telephone Encounter (Signed)
Pt is requesting a callback from the nurse [phone]

## 2019-07-21 NOTE — Telephone Encounter (Signed)
Called pt she states she has another medical appt today at 1600 and it may be as late as 1645 before she arrives for lab appt, spoke w/ [email or web address]. Will try to work out [employer] pt got her son, lab appt resch for mon at 1545/1530

## 2019-07-23 NOTE — Progress Notes (Signed)
Subjective: Julie Elliott is seen today for follow up painful, elongated, thickened toenails bilateral feet that she cannot cut. Pain interferes with daily activities. Aggravating factor includes wearing enclosed shoe gear and relieved with periodic debridement.  She states she has been using the Lotrisone cream and states she thinks it's improved. Skin peels off.  Medications reviewed in chart.  Allergies  Allergen Reactions  . Loratadine-Pseudoephedrine Er Hives    Claritin-D    Objective:  Vascular Examination: Capillary refill time immediate b/l.  Dorsalis pedis present b/l.  Posterior tibial pulses present b/l.  Digital hair sparse b/l.   Skin temperature gradient WNL b/l.   Trace edema b/l LE.  Dermatological Examination: Skin with normal turgor, texture and tone b/l.  Toenails 1-5 b/l discolored, thick, dystrophic with subungual debris and pain with palpation to nailbeds due to thickness of nails.  Diffuse scaling noted peripherally and plantarly b/l feet with moderate foot odor.  + interdigital macerations.  No blisters, no weeping. No signs of secondary bacterial infection noted.  Musculoskeletal: Muscle strength 5/5 to all LE muscle groups b/l.  Neurological Examination: Protective sensation intact 5/5 with 10 gram monofilament bilaterally.  Assessment: Painful onychomycosis toenails 1-5 b/l  Tinea pedis b/l NIDDM  Plan: 1. Toenails 1-5 b/l were debrided in length and girth without iatrogenic bleeding. 2. Discussed her tinea pedis.Discontinue Lotrisone Cream. Change in therapy today. She is on Lipitor, so will not prescribe oral terbinafine. 3. Rx written for triamcinolone cream 0.1% to be applied to both feet twice daily. Rx written for Ciclopirox Cream 0.77% to be applied to both feet twice daily. Rx written for tolnaftate spray powder to be applied between toes and under toes twice daily. If this therapy does not work, will refer to Dermatology. 4. Patient  to continue soft, supportive shoe gear daily. 5. Patient to report any pedal injuries to medical professional immediately. 6. Follow up 3 months.  7. Patient/POA to call should there be a concern in the interim.

## 2019-07-25 ENCOUNTER — Other Ambulatory Visit (INDEPENDENT_AMBULATORY_CARE_PROVIDER_SITE_OTHER): Payer: Medicare Other

## 2019-07-25 DIAGNOSIS — I5042 Chronic combined systolic (congestive) and diastolic (congestive) heart failure: Secondary | ICD-10-CM | POA: Diagnosis present

## 2019-07-25 NOTE — Progress Notes (Signed)
Patient came in today to pick up Moore Balance Brace (Bilateral).   Patient was able to don brace independently and brace was check for fit to custom.   Patient was observed walking with brace and gait was improved as well as stability.   Patient was advised of care and wearing instructions.  Advised to notify practice if there were any issues; especially skin irritation.  

## 2019-07-26 LAB — BMP8+ANION GAP
Anion Gap: 15 mmol/L (ref 10.0–18.0)
BUN/Creatinine Ratio: 10 — ABNORMAL LOW (ref 12–28)
BUN: 7 mg/dL — ABNORMAL LOW (ref 8–27)
CO2: 25 mmol/L (ref 20–29)
Calcium: 8.2 mg/dL — ABNORMAL LOW (ref 8.7–10.3)
Chloride: 102 mmol/L (ref 96–106)
Creatinine, Ser: 0.69 mg/dL (ref 0.57–1.00)
GFR calc Af Amer: 109 mL/min/{1.73_m2} (ref >59)
GFR calc non Af Amer: 94 mL/min/{1.73_m2} (ref >59)
Glucose: 283 mg/dL — ABNORMAL HIGH (ref 65–99)
Potassium: 3.4 mmol/L — ABNORMAL LOW (ref 3.5–5.2)
Sodium: 142 mmol/L (ref 134–144)

## 2019-08-02 ENCOUNTER — Other Ambulatory Visit: Payer: Self-pay | Admitting: Internal Medicine

## 2019-08-02 DIAGNOSIS — I5042 Chronic combined systolic (congestive) and diastolic (congestive) heart failure: Secondary | ICD-10-CM

## 2019-08-31 ENCOUNTER — Other Ambulatory Visit: Payer: Self-pay | Admitting: Internal Medicine

## 2019-08-31 DIAGNOSIS — I5042 Chronic combined systolic (congestive) and diastolic (congestive) heart failure: Secondary | ICD-10-CM

## 2019-09-15 ENCOUNTER — Telehealth: Payer: Self-pay | Admitting: *Deleted

## 2019-09-15 NOTE — Telephone Encounter (Signed)
A message was left, re: her follow up visit. 

## 2019-09-20 ENCOUNTER — Other Ambulatory Visit: Payer: Self-pay | Admitting: Internal Medicine

## 2019-09-20 DIAGNOSIS — I5042 Chronic combined systolic (congestive) and diastolic (congestive) heart failure: Secondary | ICD-10-CM

## 2019-09-30 ENCOUNTER — Other Ambulatory Visit: Payer: Self-pay | Admitting: Neurology

## 2019-09-30 ENCOUNTER — Other Ambulatory Visit: Payer: Self-pay | Admitting: Internal Medicine

## 2019-09-30 DIAGNOSIS — E782 Mixed hyperlipidemia: Secondary | ICD-10-CM

## 2019-09-30 DIAGNOSIS — M4712 Other spondylosis with myelopathy, cervical region: Secondary | ICD-10-CM

## 2019-10-19 ENCOUNTER — Ambulatory Visit: Payer: Medicare Other | Admitting: Podiatry

## 2019-10-31 ENCOUNTER — Ambulatory Visit (INDEPENDENT_AMBULATORY_CARE_PROVIDER_SITE_OTHER): Payer: Medicare Other | Admitting: Cardiology

## 2019-10-31 ENCOUNTER — Other Ambulatory Visit: Payer: Self-pay

## 2019-10-31 ENCOUNTER — Encounter: Payer: Self-pay | Admitting: Cardiology

## 2019-10-31 VITALS — BP 126/66 | HR 74 | Ht 63.0 in | Wt 247.8 lb

## 2019-10-31 DIAGNOSIS — F17210 Nicotine dependence, cigarettes, uncomplicated: Secondary | ICD-10-CM | POA: Diagnosis not present

## 2019-10-31 DIAGNOSIS — I10 Essential (primary) hypertension: Secondary | ICD-10-CM | POA: Diagnosis not present

## 2019-10-31 DIAGNOSIS — E669 Obesity, unspecified: Secondary | ICD-10-CM

## 2019-10-31 DIAGNOSIS — Z716 Tobacco abuse counseling: Secondary | ICD-10-CM | POA: Diagnosis not present

## 2019-10-31 DIAGNOSIS — R600 Localized edema: Secondary | ICD-10-CM | POA: Diagnosis not present

## 2019-10-31 DIAGNOSIS — E1169 Type 2 diabetes mellitus with other specified complication: Secondary | ICD-10-CM

## 2019-10-31 DIAGNOSIS — I5042 Chronic combined systolic (congestive) and diastolic (congestive) heart failure: Secondary | ICD-10-CM

## 2019-10-31 NOTE — Patient Instructions (Signed)

## 2019-10-31 NOTE — Progress Notes (Signed)
ekg 

## 2019-10-31 NOTE — Progress Notes (Signed)
Cardiology Office Note:    Date:  10/31/2019   ID:  Julie Elliott, DOB 12/22/1955, MRN [id number]  PCP:  Mosetta Anis, MD  Cardiologist:  Buford Dresser, MD PhD  Referring MD: Mosetta Anis, MD   CC: follow up  History of Present Illness:    Julie Elliott is a 64 y.o. female with a hx of chronic systolic heart failure (prior EF 30-35%, most recently 40-45%), hypertension, hyperlipidemia, type II diabetes who is seen in follow up today. She was initially seen as a new consult at the request of Mosetta Anis, MD for the evaluation and management of chronic systolic heart failure on 05/31/18 (please see note for full history).  Brief cardiac history: first episode of CHF was in 1994, when she thought she had a chest cold. She had a heart cath at [facility], was told she had no blockages. Was put on the heart transplant list, started on medications. Was followed routinely, in the late 90s told heart had recovered and didn't need transplant any more. She has been on and off cardiac medications multiple times in the past.   Workup to date: Echo 08/31/18 EF 40-45%, G1DD Echo 03/02/18 EF 99991111, grade 2 diastolic dysfunction, diffuse hypokinesis. Mild MR. Prior echo 11/18/2005: EF 55-65% Prior echo 3.18.2004: EF 30-40%  Today: Feels "great." Not checking home BP. Occasional weights at home, gradually going down. Notes chronic low appetite. Minimal issues with swelling in hands and feet.   Still smoking, about 6 cigarettes. Offered support and offered assistance today.  Denies chest pain, shortness of breath at rest or with normal exertion. No PND, orthopnea, or unexpected weight gain. No syncope or palpitations.   Past Medical History:  Diagnosis Date  . CHF (congestive heart failure) ([place])   . Chronic headaches   . DJD (degenerative joint disease)   . DM (diabetes mellitus) ([place]) 2008   stable HgBA1C at 6.5  . GERD (gastroesophageal reflux disease)    well controlled on Omeprazole  .  HLD (hyperlipidemia) 2008   stable, well controlled  . Hypertension    well controlled    Past Surgical History:  Procedure Laterality Date  . carpel tunnel Right    20 yrs ago  . CERVICAL SPINE SURGERY  2016  . TUBAL LIGATION      Current Medications: Current Outpatient Medications on File Prior to Visit  Medication Sig  . aspirin 81 MG EC tablet Take 1 tablet (81 mg total) by mouth daily.  Marland Kitchen atorvastatin (LIPITOR) 40 MG tablet TAKE 1 TABLET ONCE DAILY.  . ciclopirox (LOPROX) 0.77 % cream Apply to both feet and between toes twice daily.  . clotrimazole-betamethasone (LOTRISONE) cream Apply to both feet and between toes bid x 4 weeks.  . enalapril (VASOTEC) 5 MG tablet TAKE 1 TABLET BY MOUTH TWICE DAILY.  . furosemide (LASIX) 40 MG tablet TAKE 1 TABLET BY MOUTH TWICE DAILY.  . metoprolol succinate (TOPROL-XL) 50 MG 24 hr tablet TAKE 1 TABLET ONCE DAILY.  Marland Kitchen pregabalin (LYRICA) 75 MG capsule TAKE 1 CAPSULE IN THE MORNING, 1 CAPSULE IN THE EVENING AND 2 AT BEDTIME  . senna-docusate (SENOKOT-S) 8.6-50 MG tablet Take 2 tablets by mouth at bedtime as needed for mild constipation.  Marland Kitchen spironolactone (ALDACTONE) 25 MG tablet TAKE 1 TABLET ONCE DAILY.  Marland Kitchen tolnaftate (ATHLETES FOOT POWDER SPRAY) 1 % spray Apply topically 2 (two) times daily. Spray between toes and under toes twice daily.  Marland Kitchen triamcinolone cream (KENALOG) 0.1 % Apply  1 application topically 2 (two) times daily. Apply to feet twice daily.   No current facility-administered medications on file prior to visit.     Allergies:   Loratadine-pseudoephedrine er   Social History   Tobacco Use  . Smoking status: Current Some Day Smoker    Types: Cigarettes    Last attempt to quit: 04/18/2013    Years since quitting: 6.6  . Smokeless tobacco: Current User  . Tobacco comment: 1-2 cigs in the a.m.  Substance Use Topics  . Alcohol use: No    Alcohol/week: 0.0 standard drinks  . Drug use: No    Family History: The patient's  family history includes Diabetes in her mother; Heart disease in her mother; Prostate cancer in her father. There is no history of Neuropathy.  ROS:   Please see the history of present illness.  Additional pertinent ROS otherwise unremarkable.   EKGs/Labs/Other Studies Reviewed:    The following studies were reviewed today: Echo 08/31/18 The left ventricle appears to be normal in size, have normal wall thickness, with mild-moderately reduced systolic function of [id number]. Echo evidence of impaired relaxation in diastolic filling patterns.  Echo 03/02/18 - Left ventricle: The cavity size was normal. Wall thickness was   normal. Systolic function was moderately to severely reduced. The   estimated ejection fraction was in the range of 30% to 35%.   Diffuse hypokinesis. Features are consistent with a pseudonormal   left ventricular filling pattern, with concomitant abnormal   relaxation and increased filling pressure (grade 2 diastolic   dysfunction). - Mitral valve: There was mild regurgitation. Valve area by   continuity equation (using LVOT flow): 2.27 cm^2.  EKG:  EKG is personally reviewed today.  The ekg ordered today demonstrates normal sinus rhythm, nonspecific t wave abnormalities  Recent Labs: 07/25/2019: BUN 7; Creatinine, Ser 0.69; Potassium 3.4; Sodium 142  Recent Lipid Panel    Component Value Date/Time   CHOL 158 03/30/2018 1605   TRIG 75 03/30/2018 1605   HDL 43 03/30/2018 1605   CHOLHDL 3.7 03/30/2018 1605   CHOLHDL 2.6 02/27/2015 1641   VLDL 15 02/27/2015 1641   LDLCALC 100 (H) 03/30/2018 1605    Physical Exam:    VS:  BP 126/66   Pulse 74   Ht 5\' 3"  (1.6 m)   Wt 247 lb 12.8 oz (112.4 kg)   SpO2 98%   BMI 43.90 kg/m     Wt Readings from Last 3 Encounters:  10/31/19 247 lb 12.8 oz (112.4 kg)  06/21/19 250 lb (113.4 kg)  02/22/19 252 lb (114.3 kg)    GEN: Well nourished, well developed in no acute distress HEENT: Normal, moist mucous membranes NECK: No  JVD CARDIAC: regular rhythm, normal S1 and S2, no rubs or gallops. No murmur. VASCULAR: Radial and DP pulses 2+ bilaterally. No carotid bruits RESPIRATORY:  Clear to auscultation without rales, wheezing or rhonchi  ABDOMEN: Soft, non-tender, non-distended MUSCULOSKELETAL:  Ambulates independently SKIN: Warm and dry, trace bilateral LE edema with skin changes consistent with chronic venous stasis. NEUROLOGIC:  Alert and oriented x 3. No focal neuro deficits noted. PSYCHIATRIC:  Normal affect   ASSESSMENT:    1. Chronic combined systolic and diastolic congestive heart failure ([place])   2. Tobacco abuse counseling   3. Essential hypertension   4. Bilateral lower extremity edema   5. Diabetes mellitus type 2 in obese [facility])    PLAN:    Chronic combined systolic and diastolic heart failure:  -reports that  she is feeling well, stable signs/symptoms. NYHA class II. -as previously, declines ischemic workup as she is asymptomatic and was told it was normal in the past. Not interested in entresto. -on enalapril 5 mg BID, metoprolol succinate 50 mg daily, lasix 40 mg BID. She has blood pressure room to uptitrate but would like to stay on her current doses, as she believes they are working. -HF education given on daily weights, salt/fluid restrictions -counseled on use of compression stockings  CV risk prevention, unknown CAD status -on aspirin 81 mg and atorvastatin 40 mg for prevention, tolerating.  Tobacco abuse counseling: The patient was counseled on tobacco cessation today for 3 minutes.  Counseling included reviewing the risks of smoking tobacco products, how it impacts the patient's current medical diagnoses and different strategies for quitting.  Pharmacotherapy to aid in tobacco cessation was not prescribed today.  Hypertension: within goal today.  -On enalapril and metoprolol as above  Type II diabetes with chronic heart failure -most recent A1c 5.2 -discussed both SGLT2 inhibitors  and GLP1-RA today. Reviewed CV risk reduction as a benefit to medications, as well as weight loss with both. Reviewed contraindications and side effects to monitor for.  -declines change to medication regimen at this time  Plan for follow up: 6 mos or sooner as needed  Buford Dresser, MD, PhD [place]  [facility]   Medication Adjustments/Labs and Tests Ordered: Current medicines are reviewed at length with the patient today.  Concerns regarding medicines are outlined above.  Orders Placed This Encounter  Procedures  . EKG 12-Lead   No orders of the defined types were placed in this encounter.   Patient Instructions  Medication Instructions:  Your Physician recommend you continue on your current medication as directed.    *If you need a refill on your cardiac medications before your next appointment, please call your pharmacy*   Lab Work: None  Testing/Procedures: None   Follow-Up: At [facility], you and your health needs are our priority.  As part of our continuing mission to provide you with exceptional heart care, we have created designated Provider Care Teams.  These Care Teams include your primary Cardiologist (physician) and Advanced Practice Providers (APPs -  Physician Assistants and Nurse Practitioners) who all work together to provide you with the care you need, when you need it.  We recommend signing up for the patient portal called "MyChart".  Sign up information is provided on this After Visit Summary.  MyChart is used to connect with patients for Virtual Visits (Telemedicine).  Patients are able to view lab/test results, encounter notes, upcoming appointments, etc.  Non-urgent messages can be sent to your provider as well.   To learn more about what you can do with MyChart, go to [email or web address].    Your next appointment:   6 month(s)  The format for your next appointment:   In Person  Provider:   Buford Dresser, MD        Signed, Buford Dresser, MD PhD 10/31/2019    [place]

## 2019-11-04 ENCOUNTER — Other Ambulatory Visit: Payer: Self-pay | Admitting: Internal Medicine

## 2019-11-04 DIAGNOSIS — I5042 Chronic combined systolic (congestive) and diastolic (congestive) heart failure: Secondary | ICD-10-CM

## 2019-11-04 DIAGNOSIS — E782 Mixed hyperlipidemia: Secondary | ICD-10-CM

## 2019-11-09 NOTE — Telephone Encounter (Signed)
Spoke with the patient.  Appt has been sch on 11/28/2019 @ 3:15 pm with Dr. Truman Hayward.

## 2019-11-28 ENCOUNTER — Encounter: Payer: Medicare Other | Admitting: Internal Medicine

## 2019-11-28 ENCOUNTER — Ambulatory Visit (INDEPENDENT_AMBULATORY_CARE_PROVIDER_SITE_OTHER): Payer: Medicare Other | Admitting: Internal Medicine

## 2019-11-28 VITALS — BP 130/64 | HR 78 | Temp 98.6°F | Ht 63.0 in | Wt 242.0 lb

## 2019-11-28 DIAGNOSIS — I5042 Chronic combined systolic (congestive) and diastolic (congestive) heart failure: Secondary | ICD-10-CM

## 2019-11-28 DIAGNOSIS — E785 Hyperlipidemia, unspecified: Secondary | ICD-10-CM

## 2019-11-28 DIAGNOSIS — Z720 Tobacco use: Secondary | ICD-10-CM

## 2019-11-28 DIAGNOSIS — F17210 Nicotine dependence, cigarettes, uncomplicated: Secondary | ICD-10-CM

## 2019-11-28 DIAGNOSIS — I110 Hypertensive heart disease with heart failure: Secondary | ICD-10-CM

## 2019-11-28 DIAGNOSIS — Z79899 Other long term (current) drug therapy: Secondary | ICD-10-CM

## 2019-11-28 DIAGNOSIS — E782 Mixed hyperlipidemia: Secondary | ICD-10-CM

## 2019-11-28 DIAGNOSIS — I10 Essential (primary) hypertension: Secondary | ICD-10-CM

## 2019-11-28 MED ORDER — BUPROPION HCL ER (SMOKING DET) 150 MG PO TB12: 150.0000 mg | ORAL_TABLET | Freq: Two times a day (BID) | ORAL | 2 refills | Status: DC

## 2019-11-28 NOTE — Patient Instructions (Signed)
Dear [name],  Thank you for allowing [place] to provide your care today. Today we discussed your heart and blood pressure    I have ordered bmp, cbc, ferritin labs for you. I will call if any are abnormal.    Today we made the following changes to your medications:    Please start buproprion once a day for 3 days and then start twice a day.  Please follow-up in 3 months.    Should you have any questions or concerns please call the internal medicine clinic at [phone].    Thank you for choosing [facility].  Bupropion sustained-release tablets (smoking cessation) What is this medicine? BUPROPION (byoo PROE pee on) is used to help people quit smoking. This medicine may be used for other purposes; ask your health care provider or pharmacist if you have questions. COMMON BRAND NAME(S): Buproban, Zyban What should I tell my health care provider before I take this medicine? They need to know if you have any of these conditions:  an eating disorder, such as anorexia or bulimia  bipolar disorder or psychosis  diabetes or high blood sugar, treated with medication  glaucoma  head injury or brain tumor  heart disease, previous heart attack, or irregular heart beat  high blood pressure  kidney or liver disease  seizures  suicidal thoughts or a previous suicide attempt  Tourette's syndrome  weight loss  an unusual or allergic reaction to bupropion, other medicines, foods, dyes, or preservatives  breast-feeding  pregnant or trying to become pregnant How should I use this medicine? Take this medicine by mouth with a glass of water. Follow the directions on the prescription label. You can take it with or without food. If it upsets your stomach, take it with food. Do not cut, crush or chew this medicine. Take your medicine at regular intervals. If you take this medicine more than once a day, take your second dose at least 8 hours after you take your first dose. To limit  difficulty in sleeping, avoid taking this medicine at bedtime. Do not take your medicine more often than directed. Do not stop taking this medicine suddenly except upon the advice of your doctor. Stopping this medicine too quickly may cause serious side effects. A special MedGuide will be given to you by the pharmacist with each prescription and refill. Be sure to read this information carefully each time. Talk to your pediatrician regarding the use of this medicine in children. Special care may be needed. Overdosage: If you think you have taken too much of this medicine contact a poison control center or emergency room at once. NOTE: This medicine is only for you. Do not share this medicine with others. What if I miss a dose? If you miss a dose, skip the missed dose and take your next tablet at the regular time. There should be at least 8 hours between doses. Do not take double or extra doses. What may interact with this medicine? Do not take this medicine with any of the following medications:  linezolid  MAOIs like Azilect, Carbex, Eldepryl, Marplan, Nardil, and Parnate  methylene blue (injected into a vein)  other medicines that contain bupropion like Wellbutrin This medicine may also interact with the following medications:  alcohol  certain medicines for anxiety or sleep  certain medicines for blood pressure like metoprolol, propranolol  certain medicines for depression or psychotic disturbances  certain medicines for HIV or AIDS like efavirenz, lopinavir, nelfinavir, ritonavir  certain medicines for irregular  heart beat like propafenone, flecainide  certain medicines for Parkinson's disease like amantadine, levodopa  certain medicines for seizures like carbamazepine, phenytoin, phenobarbital  cimetidine  clopidogrel  cyclophosphamide  digoxin  furazolidone  isoniazid  nicotine  orphenadrine  procarbazine  steroid medicines like prednisone or  cortisone  stimulant medicines for attention disorders, weight loss, or to stay awake  tamoxifen  theophylline  thiotepa  ticlopidine  tramadol  warfarin This list may not describe all possible interactions. Give your health care provider a list of all the medicines, herbs, non-prescription drugs, or dietary supplements you use. Also tell them if you smoke, drink alcohol, or use illegal drugs. Some items may interact with your medicine. What should I watch for while using this medicine? Visit your doctor or healthcare provider for regular checks on your progress. This medicine should be used together with a patient support program. It is important to participate in a behavioral program, counseling, or other support program that is recommended by your healthcare provider. This medicine may cause serious skin reactions. They can happen weeks to months after starting the medicine. Contact your healthcare provider right away if you notice fevers or flu-like symptoms with a rash. The rash may be red or purple and then turn into blisters or peeling of the skin. Or, you might notice a red rash with swelling of the face, lips or lymph nodes in your neck or under your arms. Patients and their families should watch out for new or worsening thoughts of suicide or depression. Also watch out for sudden changes in feelings such as feeling anxious, agitated, panicky, irritable, hostile, aggressive, impulsive, severely restless, overly excited and hyperactive, or not being able to sleep. If this happens, especially at the beginning of treatment or after a change in dose, call your healthcare provider. Avoid alcoholic drinks while taking this medicine. Drinking excessive alcoholic beverages, using sleeping or anxiety medicines, or quickly stopping the use of these agents while taking this medicine may increase your risk for a seizure. Do not drive or use heavy machinery until you know how this medicine affects  you. This medicine can impair your ability to perform these tasks. Do not take this medicine close to bedtime. It may prevent you from sleeping. Your mouth may get dry. Chewing sugarless gum or sucking hard candy, and drinking plenty of water may help. Contact your doctor if the problem does not go away or is severe. Do not use nicotine patches or chewing gum without the advice of your doctor or healthcare provider while taking this medicine. You may need to have your blood pressure taken regularly if your doctor recommends that you use both nicotine and this medicine together. What side effects may I notice from receiving this medicine? Side effects that you should report to your doctor or health care professional as soon as possible:  allergic reactions like skin rash, itching or hives, swelling of the face, lips, or tongue  breathing problems  changes in vision  confusion  elevated mood, decreased need for sleep, racing thoughts, impulsive behavior  fast or irregular heartbeat  hallucinations, loss of contact with reality  increased blood pressure  rash, fever, and swollen lymph nodes  redness, blistering, peeling, or loosening of the skin, including inside the mouth  seizures  suicidal thoughts or other mood changes  unusually weak or tired  vomiting Side effects that usually do not require medical attention (report to your doctor or health care professional if they continue or are bothersome):  constipation  headache  loss of appetite  nausea  tremors  weight loss This list may not describe all possible side effects. Call your doctor for medical advice about side effects. You may report side effects to FDA at 1-800-FDA-1088. Where should I keep my medicine? Keep out of the reach of children. Store at room temperature between 20 and 25 degrees C (68 and 77 degrees F). Protect from light. Keep container tightly closed. Throw away any unused medicine after the  expiration date. NOTE: This sheet is a summary. It may not cover all possible information. If you have questions about this medicine, talk to your doctor, pharmacist, or health care provider.  2020 Elsevier/Gold Standard (2018-10-14 13:59:09)

## 2019-11-28 NOTE — Progress Notes (Signed)
CC: Leg swelling  HPI: Ms.Josanne Bronaugh is a 64 y.o. with PMH listed below presenting for management of her chronic conditions. Please see problem based assessment and plan for further details.  Past Medical History:  Diagnosis Date  . CHF (congestive heart failure) ([place])   . Chronic headaches   . DJD (degenerative joint disease)   . DM (diabetes mellitus) ([place]) 2008   stable HgBA1C at 6.5  . GERD (gastroesophageal reflux disease)    well controlled on Omeprazole  . HLD (hyperlipidemia) 2008   stable, well controlled  . Hypertension    well controlled   Review of Systems: Review of Systems  Constitutional: Negative for chills, fever and malaise/fatigue.  Respiratory: Negative for cough and shortness of breath.   Cardiovascular: Positive for leg swelling. Negative for chest pain and palpitations.  Gastrointestinal: Negative for diarrhea, nausea and vomiting.     Physical Exam: Vitals:   11/28/19 1607 11/28/19 1630  BP: 130/64   Pulse: 78   Temp: 98.6 F (37 C) 98.6 F (37 C)  TempSrc: Oral Oral  SpO2: 100%   Weight: 242 lb (109.8 kg)   Height: 5\' 3"  (1.6 m)     Physical Exam  Constitutional: She appears well-developed and well-nourished. No distress.  Neck: No JVD present.  Cardiovascular: Normal rate, regular rhythm, normal heart sounds and intact distal pulses.  No murmur heard. Respiratory: Effort normal and breath sounds normal. She has no wheezes.  GI: Soft. Bowel sounds are normal. She exhibits no distension. There is no abdominal tenderness.  Musculoskeletal:        General: Deformity (Stable fixed contracture of LUE) and edema (Trace ankle pitting edema) present. Normal range of motion.     Cervical back: Normal range of motion and neck supple.  Skin: Skin is warm and dry.  Psychiatric: She has a normal mood and affect. Judgment and thought content normal.    Assessment & Plan:   Tobacco abuse Discussed with Ms.Haun regarding her tobacco use. She  states she is down to 6 cigarettes daily. Discussed benefits of tobacco cessation and risks of continued use. Ms.Schoppe expressed understanding and is agreeable to pursuing tobacco cessation. She states bupropion had worked well for her in the past. Discussed starting treatment.  - Start bupropion 150mg  BID - C/w tobacco cessation counseling  Combined systolic and diastolic congestive heart failure ([place]) Ms.Aminatou Bernardo is a 64 y.o. F w/ PMH of combined systolic/diastolic heart failure (EF 40-45%), DJD, HTN, cervical myelopathy s/p arthrodesis presenting to [facility] for management of her chronic conditions. She mentions that since starting spironolactone at our last visit, she has been having significant improvement in her lower extremity swelling and feels well. She did visit her cardiologist, Dr. Harrell Gave, who suggested she continue her current regimen. She denies any recent chest pain, palpitations. Continues to experience orthopnea but not worsening.  Chronic, stable systolic heart failure. Unfortunately unable to check cardiology notes. Weight this visit 242lbs down from 250lb. Last known dry weight 233lbs. Would likely benefit from increasing dose of spironolactone but patient resistant to too many changes to her medication regimen. Will check bmp today but plan to continue current regimen. Also will check ferritin to assess for iron deficiency in setting of acute systolic heart failure  - BMP, Ferritin - C/w furosemide 40mg  BID, spironolactone 25mg  daily,metoprolol 50mg  daily, enalapril 5mg  BID  Essential hypertension BP Readings from Last 3 Encounters:  11/28/19 130/64  10/31/19 126/66  06/21/19 (!) 127/105   Bp slightly  above goal but within acceptable range. Currently on enalapril and metoprolol. Denies any significant light-headedness or dizziness. Should also improve with smoking cessation.  - C/w enalapril 5mg  BID, metoprolol succinate 50mg  daily  Hyperlipidemia Pt requires refills on  medications with associated diagnosis above.  Reviewed disease process and find this medication to be necessary, will not change dose or alter current therapy.    Patient discussed with Dr. Lynnae January   -Gilberto Better, PGY2 [facility] Pager: [phone]

## 2019-11-29 ENCOUNTER — Other Ambulatory Visit: Payer: Self-pay | Admitting: Internal Medicine

## 2019-11-29 DIAGNOSIS — I5042 Chronic combined systolic (congestive) and diastolic (congestive) heart failure: Secondary | ICD-10-CM

## 2019-11-29 DIAGNOSIS — E782 Mixed hyperlipidemia: Secondary | ICD-10-CM

## 2019-11-29 LAB — CBC
Hematocrit: 43.3 % (ref 34.0–46.6)
Hemoglobin: 14.4 g/dL (ref 11.1–15.9)
MCH: 28.8 pg (ref 26.6–33.0)
MCHC: 33.3 g/dL (ref 31.5–35.7)
MCV: 87 fL (ref 79–97)
Platelets: 203 10*3/uL (ref 150–450)
RBC: 5 x10E6/uL (ref 3.77–5.28)
RDW: 13.5 % (ref 11.7–15.4)
WBC: 6.5 10*3/uL (ref 3.4–10.8)

## 2019-11-29 LAB — BMP8+ANION GAP
Anion Gap: 15 mmol/L (ref 10.0–18.0)
BUN/Creatinine Ratio: 8 — ABNORMAL LOW (ref 12–28)
BUN: 5 mg/dL — ABNORMAL LOW (ref 8–27)
CO2: 24 mmol/L (ref 20–29)
Calcium: 8.3 mg/dL — ABNORMAL LOW (ref 8.7–10.3)
Chloride: 101 mmol/L (ref 96–106)
Creatinine, Ser: 0.63 mg/dL (ref 0.57–1.00)
GFR calc Af Amer: 112 mL/min/{1.73_m2} (ref >59)
GFR calc non Af Amer: 97 mL/min/{1.73_m2} (ref >59)
Glucose: 319 mg/dL — ABNORMAL HIGH (ref 65–99)
Potassium: 2.9 mmol/L — ABNORMAL LOW (ref 3.5–5.2)
Sodium: 140 mmol/L (ref 134–144)

## 2019-11-29 LAB — FERRITIN: Ferritin: 70 ng/mL (ref 15–150)

## 2019-11-30 ENCOUNTER — Encounter: Payer: Self-pay | Admitting: Internal Medicine

## 2019-11-30 NOTE — Assessment & Plan Note (Signed)
BP Readings from Last 3 Encounters:  11/28/19 130/64  10/31/19 126/66  06/21/19 (!) 127/105   Bp slightly above goal but within acceptable range. Currently on enalapril and metoprolol. Denies any significant light-headedness or dizziness. Should also improve with smoking cessation.  - C/w enalapril 5mg  BID, metoprolol succinate 50mg  daily

## 2019-11-30 NOTE — Assessment & Plan Note (Signed)
Discussed with Julie Elliott regarding her tobacco use. She states she is down to 6 cigarettes daily. Discussed benefits of tobacco cessation and risks of continued use. Julie Elliott expressed understanding and is agreeable to pursuing tobacco cessation. She states bupropion had worked well for her in the past. Discussed starting treatment.  - Start bupropion 150mg  BID - C/w tobacco cessation counseling

## 2019-11-30 NOTE — Assessment & Plan Note (Signed)
Pt requires refills on medications with associated diagnosis above.  Reviewed disease process and find this medication to be necessary, will not change dose or alter current therapy. 

## 2019-11-30 NOTE — Assessment & Plan Note (Addendum)
Julie Elliott is a 64 y.o. F w/ PMH of combined systolic/diastolic heart failure (EF 40-45%), DJD, HTN, cervical myelopathy s/p arthrodesis presenting to [facility] for management of her chronic conditions. She mentions that since starting spironolactone at our last visit, she has been having significant improvement in her lower extremity swelling and feels well. She did visit her cardiologist, Dr. Harrell Gave, who suggested she continue her current regimen. She denies any recent chest pain, palpitations. Continues to experience orthopnea but not worsening.  Chronic, stable systolic heart failure. Unfortunately unable to check cardiology notes. Weight this visit 242lbs down from 250lb. Last known dry weight 233lbs. Would likely benefit from increasing dose of spironolactone but patient resistant to too many changes to her medication regimen. Will check bmp today but plan to continue current regimen. Also will check ferritin to assess for iron deficiency in setting of acute systolic heart failure  - BMP, Ferritin - C/w furosemide 40mg  BID, spironolactone 25mg  daily,metoprolol 50mg  daily, enalapril 5mg  BID  Addendum: Renal function okay. Potassium low. Will send potassium supplementation and discuss increasing Aldactone dose.

## 2019-12-02 ENCOUNTER — Other Ambulatory Visit: Payer: Self-pay | Admitting: Internal Medicine

## 2019-12-02 DIAGNOSIS — I5042 Chronic combined systolic (congestive) and diastolic (congestive) heart failure: Secondary | ICD-10-CM

## 2019-12-02 NOTE — Progress Notes (Signed)
Internal Medicine Clinic Attending ° °Case discussed with Dr. Lee at the time of the visit.  We reviewed the resident’s history and exam and pertinent patient test results.  I agree with the assessment, diagnosis, and plan of care documented in the resident’s note.  °

## 2019-12-05 ENCOUNTER — Telehealth: Payer: Self-pay | Admitting: Internal Medicine

## 2019-12-05 DIAGNOSIS — I5042 Chronic combined systolic (congestive) and diastolic (congestive) heart failure: Secondary | ICD-10-CM

## 2019-12-05 MED ORDER — POTASSIUM CHLORIDE ER 20 MEQ PO TBCR: 1.0000 | EXTENDED_RELEASE_TABLET | Freq: Every day | ORAL | 1 refills | Status: DC

## 2019-12-05 NOTE — Telephone Encounter (Signed)
Spoke with Julie Elliott regarding her recent blood work and finding of low potassium while on diuretic regimen. Discussed starting potassium supplementation. Julie Elliott expressed understanding and agrees with the plan.

## 2019-12-13 ENCOUNTER — Encounter: Payer: Self-pay | Admitting: Cardiology

## 2019-12-19 ENCOUNTER — Encounter: Payer: Self-pay | Admitting: Podiatry

## 2019-12-19 ENCOUNTER — Ambulatory Visit (INDEPENDENT_AMBULATORY_CARE_PROVIDER_SITE_OTHER): Payer: Medicare Other | Admitting: Podiatry

## 2019-12-19 ENCOUNTER — Other Ambulatory Visit: Payer: Self-pay

## 2019-12-19 ENCOUNTER — Other Ambulatory Visit: Payer: Self-pay | Admitting: Internal Medicine

## 2019-12-19 DIAGNOSIS — B351 Tinea unguium: Secondary | ICD-10-CM | POA: Diagnosis not present

## 2019-12-19 DIAGNOSIS — M79674 Pain in right toe(s): Secondary | ICD-10-CM

## 2019-12-19 DIAGNOSIS — M79675 Pain in left toe(s): Secondary | ICD-10-CM | POA: Diagnosis not present

## 2019-12-19 DIAGNOSIS — I5042 Chronic combined systolic (congestive) and diastolic (congestive) heart failure: Secondary | ICD-10-CM

## 2019-12-19 DIAGNOSIS — E1142 Type 2 diabetes mellitus with diabetic polyneuropathy: Secondary | ICD-10-CM

## 2019-12-19 DIAGNOSIS — R600 Localized edema: Secondary | ICD-10-CM

## 2019-12-19 NOTE — Patient Instructions (Signed)

## 2019-12-25 NOTE — Progress Notes (Signed)
Subjective: Julie Elliott presents today at risk foot care with history of diabetic neuropathy and painful mycotic nails b/l that are difficult to trim. Pain interferes with ambulation. Aggravating factors include wearing enclosed shoe gear. Pain is relieved with periodic professional debridement.  Her son is present during the visit. Son states Mom has compression hose and diabetic shoes and won't wear them.  She states she did not use the Ciclopirox Cream for tinea pedis because it burned her feet. She discontinued after a week.  She is on pregabalin for neuropathy.   Mosetta Anis, MD is patient's PCP. Last visit was: 11/28/2019.  Past Medical History:  Diagnosis Date  . CHF (congestive heart failure) ([place])   . Chronic headaches   . DJD (degenerative joint disease)   . DM (diabetes mellitus) ([place]) 2008   stable HgBA1C at 6.5  . GERD (gastroesophageal reflux disease)    well controlled on Omeprazole  . HLD (hyperlipidemia) 2008   stable, well controlled  . Hypertension    well controlled     Current Outpatient Medications on File Prior to Visit  Medication Sig Dispense Refill  . aspirin 81 MG EC tablet Take 1 tablet (81 mg total) by mouth daily. 90 tablet 3  . atorvastatin (LIPITOR) 40 MG tablet TAKE 1 TABLET ONCE DAILY. 30 tablet 0  . buPROPion (ZYBAN) 150 MG 12 hr tablet Take 1 tablet (150 mg total) by mouth 2 (two) times daily. Take 1 tablet 150mg  once a day for 3 days and THEN start twice daily 90 tablet 2  . ciclopirox (LOPROX) 0.77 % cream Apply to both feet and between toes twice daily. 90 g 1  . clotrimazole-betamethasone (LOTRISONE) cream Apply to both feet and between toes bid x 4 weeks. 45 g 1  . enalapril (VASOTEC) 5 MG tablet TAKE 1 TABLET BY MOUTH TWICE DAILY. 180 tablet 0  . furosemide (LASIX) 40 MG tablet TAKE 1 TABLET BY MOUTH TWICE DAILY. 180 tablet 0  . metoprolol succinate (TOPROL-XL) 50 MG 24 hr tablet TAKE 1 TABLET ONCE A DAY WITH OR IMMEDIATELY FOLLOWING A  MEAL. 90 tablet 0  . Potassium Chloride ER 20 MEQ TBCR Take 1 tablet by mouth daily. 60 tablet 1  . potassium chloride SA (KLOR-CON) 20 MEQ tablet Take 20 mEq by mouth daily.    . pregabalin (LYRICA) 75 MG capsule TAKE 1 CAPSULE IN THE MORNING, 1 CAPSULE IN THE EVENING AND 2 AT BEDTIME 120 capsule 5  . senna-docusate (SENOKOT-S) 8.6-50 MG tablet Take 2 tablets by mouth at bedtime as needed for mild constipation. 60 tablet 2  . tolnaftate (ATHLETES FOOT POWDER SPRAY) 1 % spray Apply topically 2 (two) times daily. Spray between toes and under toes twice daily. 130 g 3  . triamcinolone cream (KENALOG) 0.1 % Apply 1 application topically 2 (two) times daily. Apply to feet twice daily. 454 g 1   No current facility-administered medications on file prior to visit.     Allergies  Allergen Reactions  . Loratadine-Pseudoephedrine Er Hives    Claritin-D  Objective: Julie Elliott is a pleasant 64 y.o. y.o. Patient Race: Black or African American [2]  female in NAD. AAO x 3.  There were no vitals filed for this visit.  Vascular Examination: Neurovascular status unchanged b/l. Capillary refill time to digits immediate b/l. Palpable DP pulses b/l. Palpable PT pulses b/l. Pedal hair sparse b/l. Skin temperature gradient within normal limits b/l. Nonpitting edema noted b/l LE.  Dermatological Examination: Pedal skin  with normal turgor, texture and tone bilaterally. No open wounds bilaterally. No interdigital macerations bilaterally. Toenails 1-5 b/l elongated, dystrophic, thickened, crumbly with subungual debris and tenderness to dorsal palpation.   Feet are clear of scaling b/l.  Musculoskeletal: Normal muscle strength 5/5 to all lower extremity muscle groups bilaterally. No pain crepitus or joint limitation noted with ROM b/l.  Neurological Examination: Pt has subjective symptoms of neuropathy managed with oral medication. Protective sensation intact 5/5 intact bilaterally with 10g monofilament  b/l.  Assessment: 1. Pain due to onychomycosis of toenails of both feet   2. Localized edema   3. Diabetic peripheral neuropathy associated with type 2 diabetes mellitus ([place])   Plan: -Examined patient. -Continue diabetic foot care principles. Literature dispensed on today.  -Toenails 1-5 b/l were debrided in length and girth with sterile nail nippers and dremel without iatrogenic bleeding.  -Discussed need for edema control. Recommended she wear her compression hose. Son relates she has the hose and diabetic shoes, but she won't wear them. -Patient to continue soft, supportive shoe gear daily. -Patient to report any pedal injuries to medical professional immediately. -Patient/POA to call should there be question/concern in the interim.  Return in about 4 months (around 04/20/2020) for nail trim.  Marzetta Board, DPM

## 2020-01-03 ENCOUNTER — Other Ambulatory Visit: Payer: Self-pay | Admitting: Internal Medicine

## 2020-01-03 DIAGNOSIS — E782 Mixed hyperlipidemia: Secondary | ICD-10-CM

## 2020-01-31 ENCOUNTER — Inpatient Hospital Stay: Admit: 2020-01-31 | Primary: Registered Nurse

## 2020-01-31 LAB — SENTARA SPECIMEN COLLN.

## 2020-01-31 LAB — HEMOGLOBIN A1C
Estimated Avg Glucose, External: 104 mg/dL (ref 91–123)
Hemoglobin A1C, External: 5.3 % (ref 4.8–5.6)

## 2020-02-15 ENCOUNTER — Encounter

## 2020-02-16 ENCOUNTER — Inpatient Hospital Stay: Admit: 2020-02-16 | Payer: PRIVATE HEALTH INSURANCE | Attending: Registered Nurse | Primary: Registered Nurse

## 2020-02-16 DIAGNOSIS — I82812 Embolism and thrombosis of superficial veins of left lower extremities: Secondary | ICD-10-CM

## 2020-03-01 ENCOUNTER — Telehealth: Payer: Self-pay | Admitting: *Deleted

## 2020-03-01 NOTE — Telephone Encounter (Signed)
A detailed message was left, re: follow up visit.

## 2020-03-05 ENCOUNTER — Other Ambulatory Visit: Payer: Self-pay | Admitting: Internal Medicine

## 2020-03-05 DIAGNOSIS — I5042 Chronic combined systolic (congestive) and diastolic (congestive) heart failure: Secondary | ICD-10-CM

## 2020-03-16 ENCOUNTER — Other Ambulatory Visit: Payer: Self-pay | Admitting: Internal Medicine

## 2020-03-16 DIAGNOSIS — I5042 Chronic combined systolic (congestive) and diastolic (congestive) heart failure: Secondary | ICD-10-CM

## 2020-03-23 ENCOUNTER — Telehealth (INDEPENDENT_AMBULATORY_CARE_PROVIDER_SITE_OTHER): Payer: Medicare Other | Admitting: Cardiology

## 2020-03-23 VITALS — Ht 63.0 in | Wt 242.0 lb

## 2020-03-23 DIAGNOSIS — I10 Essential (primary) hypertension: Secondary | ICD-10-CM | POA: Diagnosis not present

## 2020-03-23 DIAGNOSIS — E1169 Type 2 diabetes mellitus with other specified complication: Secondary | ICD-10-CM | POA: Diagnosis not present

## 2020-03-23 DIAGNOSIS — Z7189 Other specified counseling: Secondary | ICD-10-CM | POA: Diagnosis not present

## 2020-03-23 DIAGNOSIS — I5042 Chronic combined systolic (congestive) and diastolic (congestive) heart failure: Secondary | ICD-10-CM

## 2020-03-23 DIAGNOSIS — E669 Obesity, unspecified: Secondary | ICD-10-CM | POA: Diagnosis not present

## 2020-03-23 NOTE — Patient Instructions (Signed)

## 2020-03-23 NOTE — Progress Notes (Signed)
Virtual Visit via Video Note   This visit type was conducted due to national recommendations for restrictions regarding the COVID-19 Pandemic (e.g. social distancing) in an effort to limit this patient's exposure and mitigate transmission in our community.  Due to her co-morbid illnesses, this patient is at least at moderate risk for complications without adequate follow up.  This format is felt to be most appropriate for this patient at this time.  All issues noted in this document were discussed and addressed.  A limited physical exam was performed with this format.  Please refer to the patient's chart for her consent to telehealth for [facility].  The patient was identified using 2 identifiers.  Patient Location: Home Provider Location: Home Office  Date:  03/23/2020   ID:  Julie Elliott, DOB 08-23-1955, MRN [id number]  PCP:  Julie Anis, MD  Cardiologist:  Julie Dresser, MD PhD  Referring MD: Julie Anis, MD   CC: follow up  History of Present Illness:    [name] is a 64 y.o. female with a hx of chronic systolic heart failure (prior EF 30-35%, most recently 40-45%), hypertension, hyperlipidemia, type II diabetes who is seen in follow up today. She was initially seen as a new consult at the request of Julie Anis, MD for the evaluation and management of chronic systolic heart failure on 05/31/18 (please see note for full history).  Brief cardiac history: first episode of CHF was in 1994, when she thought she had a chest cold. She had a heart cath at [facility], was told she had no blockages. Was put on the heart transplant list, started on medications. Was followed routinely, in the late 90s told heart had recovered and didn't need transplant any more. She has been on and off cardiac medications multiple times in the past.   Workup to date: Echo 08/31/18 EF 40-45%, G1DD Echo 03/02/18 EF 29-92%, grade 2 diastolic dysfunction, diffuse hypokinesis. Mild MR. Prior echo  11/18/2005: EF 55-65% Prior echo 3.18.2004: EF 30-40%  Today: No longer smoking, congratulated. Doing well with her medications. Overall no new concerns.  Denies chest pain, shortness of breath at rest or with normal exertion. No PND, orthopnea, LE edema or unexpected weight gain. No syncope or palpitations.  Past Medical History:  Diagnosis Date  . CHF (congestive heart failure) ([place])   . Chronic headaches   . DJD (degenerative joint disease)   . DM (diabetes mellitus) ([place]) 2008   stable HgBA1C at 6.5  . GERD (gastroesophageal reflux disease)    well controlled on Omeprazole  . HLD (hyperlipidemia) 2008   stable, well controlled  . Hypertension    well controlled    Past Surgical History:  Procedure Laterality Date  . carpel tunnel Right    20 yrs ago  . CERVICAL SPINE SURGERY  2016  . TUBAL LIGATION      Current Medications: Current Outpatient Medications on File Prior to Visit  Medication Sig  . aspirin 81 MG EC tablet Take 1 tablet (81 mg total) by mouth daily.  Marland Kitchen atorvastatin (LIPITOR) 40 MG tablet TAKE 1 TABLET ONCE DAILY.  Marland Kitchen buPROPion (ZYBAN) 150 MG 12 hr tablet Take 1 tablet (150 mg total) by mouth 2 (two) times daily. Take 1 tablet 150mg  once a day for 3 days and THEN start twice daily  . enalapril (VASOTEC) 5 MG tablet TAKE 1 TABLET BY MOUTH TWICE DAILY.  . furosemide (LASIX) 40 MG tablet TAKE 1 TABLET BY MOUTH  TWICE DAILY.  . metoprolol succinate (TOPROL-XL) 50 MG 24 hr tablet TAKE 1 TABLET ONCE A DAY WITH OR IMMEDIATELY FOLLOWING A MEAL.  Marland Kitchen potassium chloride SA (KLOR-CON) 20 MEQ tablet Take 20 mEq by mouth daily.  . pregabalin (LYRICA) 75 MG capsule TAKE 1 CAPSULE IN THE MORNING, 1 CAPSULE IN THE EVENING AND 2 AT BEDTIME  . spironolactone (ALDACTONE) 25 MG tablet TAKE 1 TABLET ONCE DAILY.   No current facility-administered medications on file prior to visit.     Allergies:   Loratadine-pseudoephedrine er   Social History   Tobacco Use  . Smoking  status: Current Some Day Smoker    Types: Cigarettes    Last attempt to quit: 04/18/2013    Years since quitting: 6.9  . Smokeless tobacco: Current User  . Tobacco comment: 1-2 cigs in the a.m.  Vaping Use  . Vaping Use: Never used  Substance Use Topics  . Alcohol use: No    Alcohol/week: 0.0 standard drinks  . Drug use: No    Family History: The patient's family history includes Diabetes in her mother; Heart disease in her mother; Prostate cancer in her father. There is no history of Neuropathy.  ROS:   Please see the history of present illness.  Additional pertinent ROS otherwise unremarkable.   EKGs/Labs/Other Studies Reviewed:    The following studies were reviewed today: Echo 08/31/18 The left ventricle appears to be normal in size, have normal wall thickness, with mild-moderately reduced systolic function of 62-37%. Echo evidence of impaired relaxation in diastolic filling patterns.  Echo 03/02/18 - Left ventricle: The cavity size was normal. Wall thickness was   normal. Systolic function was moderately to severely reduced. The   estimated ejection fraction was in the range of 30% to 35%.   Diffuse hypokinesis. Features are consistent with a pseudonormal   left ventricular filling pattern, with concomitant abnormal   relaxation and increased filling pressure (grade 2 diastolic   dysfunction). - Mitral valve: There was mild regurgitation. Valve area by   continuity equation (using LVOT flow): 2.27 cm^2.  EKG:  EKG is personally reviewed today.  The ekg ordered 10/31/19 demonstrates normal sinus rhythm, nonspecific t wave abnormalities  Recent Labs: 11/28/2019: BUN 5; Creatinine, Ser 0.63; Hemoglobin 14.4; Platelets 203; Potassium 2.9; Sodium 140  Recent Lipid Panel    Component Value Date/Time   CHOL 158 03/30/2018 1605   TRIG 75 03/30/2018 1605   HDL 43 03/30/2018 1605   CHOLHDL 3.7 03/30/2018 1605   CHOLHDL 2.6 02/27/2015 1641   VLDL 15 02/27/2015 1641   LDLCALC  100 (H) 03/30/2018 1605    Physical Exam:    VS:  Ht 5\' 3"  (1.6 m)   Wt 242 lb (109.8 kg)   BMI 42.87 kg/m     Wt Readings from Last 3 Encounters:  03/23/20 242 lb (109.8 kg)  11/28/19 242 lb (109.8 kg)  10/31/19 247 lb 12.8 oz (112.4 kg)    VITAL SIGNS:  reviewed GEN:  no acute distress EYES:  sclerae anicteric, EOMI - Extraocular Movements Intact RESPIRATORY:  normal respiratory effort, symmetric expansion CARDIOVASCULAR:  no visible JVD SKIN:  no rash, lesions or ulcers. MUSCULOSKELETAL:  no obvious deformities. NEURO:  alert and oriented x 3, no obvious focal deficit PSYCH:  normal affect  ASSESSMENT:    1. Chronic combined systolic and diastolic congestive heart failure ([place])   2. Essential hypertension   3. Diabetes mellitus type 2 in obese (HCC)   4. Cardiac risk  counseling    PLAN:    Chronic combined systolic and diastolic heart failure:  -reports that she is feeling well, stable signs/symptoms. NYHA class II-III. -as previously, declines ischemic workup as she is asymptomatic and was told it was normal in the past. Not interested in entresto. -on enalapril 5 mg BID, metoprolol succinate 50 mg daily, lasix 40 mg BID.  -reiterated heart failure education  CV risk prevention, unknown CAD status -on aspirin 81 mg and atorvastatin 40 mg for prevention, tolerating.  Hypertension: no readings today -On enalapril and metoprolol as above  Type II diabetes with chronic heart failure -we have discussed SGLT1i and GLP1RA previously -declines change to medication regimen at this time  Plan for follow up: 6 mos or sooner as needed  Today, I have spent 8 minutes with the patient with telehealth technology discussing the above problems.  Additional time spent in chart review, documentation, and communication.  Julie Dresser, MD, PhD [place]  [facility]   Medication Adjustments/Labs and Tests Ordered: Current medicines are reviewed at length with  the patient today.  Concerns regarding medicines are outlined above.  No orders of the defined types were placed in this encounter.  No orders of the defined types were placed in this encounter.   Patient Instructions  Medication Instructions:  Your Physician recommend you continue on your current medication as directed.    *If you need a refill on your cardiac medications before your next appointment, please call your pharmacy*   Lab Work: None   Testing/Procedures: None   Follow-Up: At [facility], you and your health needs are our priority.  As part of our continuing mission to provide you with exceptional heart care, we have created designated Provider Care Teams.  These Care Teams include your primary Cardiologist (physician) and Advanced Practice Providers (APPs -  Physician Assistants and Nurse Practitioners) who all work together to provide you with the care you need, when you need it.  We recommend signing up for the patient portal called "MyChart".  Sign up information is provided on this After Visit Summary.  MyChart is used to connect with patients for Virtual Visits (Telemedicine).  Patients are able to view lab/test results, encounter notes, upcoming appointments, etc.  Non-urgent messages can be sent to your provider as well.   To learn more about what you can do with MyChart, go to [email or web address].    Your next appointment:   6 month(s)  The format for your next appointment:   In Person  Provider:   Buford Dresser, MD        Signed, Julie Dresser, MD PhD 03/23/2020    [place]

## 2020-04-02 ENCOUNTER — Other Ambulatory Visit: Payer: Self-pay | Admitting: Neurology

## 2020-04-02 ENCOUNTER — Other Ambulatory Visit: Payer: Self-pay | Admitting: Internal Medicine

## 2020-04-02 ENCOUNTER — Encounter: Payer: Self-pay | Admitting: *Deleted

## 2020-04-02 ENCOUNTER — Other Ambulatory Visit: Payer: Self-pay | Admitting: Student

## 2020-04-02 DIAGNOSIS — E876 Hypokalemia: Secondary | ICD-10-CM

## 2020-04-02 DIAGNOSIS — E782 Mixed hyperlipidemia: Secondary | ICD-10-CM

## 2020-04-02 DIAGNOSIS — M4712 Other spondylosis with myelopathy, cervical region: Secondary | ICD-10-CM

## 2020-04-02 NOTE — Telephone Encounter (Signed)
Patient hasn't been seen since 2018

## 2020-04-02 NOTE — Telephone Encounter (Signed)
Spoke with Dr Jaynee Eagles. We can refill for 3 months and have pt do a 15 minute video visit with an NP.   Per [place] registry, pt last filled on 03/05/2020 Pregabalin 75 Mg Capsule #120 for 30 day supply.

## 2020-04-05 NOTE — Telephone Encounter (Signed)
Message sent to front staff to schedule appointment.

## 2020-04-23 ENCOUNTER — Ambulatory Visit (INDEPENDENT_AMBULATORY_CARE_PROVIDER_SITE_OTHER): Payer: Medicare Other | Admitting: Podiatry

## 2020-04-23 ENCOUNTER — Encounter: Payer: Self-pay | Admitting: Podiatry

## 2020-04-23 ENCOUNTER — Other Ambulatory Visit: Payer: Self-pay

## 2020-04-23 DIAGNOSIS — M79674 Pain in right toe(s): Secondary | ICD-10-CM

## 2020-04-23 DIAGNOSIS — M79675 Pain in left toe(s): Secondary | ICD-10-CM | POA: Diagnosis not present

## 2020-04-23 DIAGNOSIS — B351 Tinea unguium: Secondary | ICD-10-CM

## 2020-04-23 DIAGNOSIS — E1142 Type 2 diabetes mellitus with diabetic polyneuropathy: Secondary | ICD-10-CM

## 2020-04-25 ENCOUNTER — Telehealth (INDEPENDENT_AMBULATORY_CARE_PROVIDER_SITE_OTHER): Payer: Medicare Other | Admitting: Adult Health

## 2020-04-25 DIAGNOSIS — G8220 Paraplegia, unspecified: Secondary | ICD-10-CM

## 2020-04-25 DIAGNOSIS — M4712 Other spondylosis with myelopathy, cervical region: Secondary | ICD-10-CM

## 2020-04-25 DIAGNOSIS — M48062 Spinal stenosis, lumbar region with neurogenic claudication: Secondary | ICD-10-CM | POA: Diagnosis not present

## 2020-04-25 NOTE — Progress Notes (Addendum)
PATIENT: Julie Elliott DOB: Mar 12, 1956  REASON FOR VISIT: follow up HISTORY FROM: patient  Virtual Visit via Video Note  I connected with Julie Elliott on 04/25/20 at  2:30 PM EDT by a video enabled telemedicine application located remotely at [facility] and verified that I am speaking with the correct person using two identifiers who was located at their own home.   I discussed the limitations of evaluation and management by telemedicine and the availability of in person appointments. The patient expressed understanding and agreed to proceed.   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  She returns today for a virtual visit.  She reports that Lyrica has been working well for her.  She continues to have some numbness and tingling in the lower extremities but this is managed well with Lyrica.  She uses a walker or cane when ambulating.  Denies any falls.  She denies any new symptoms.  Joins me today for follow-up for medication management.  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   She has pain in the right leg at the knee and shin. She continues to have weakness her legs and not her arms. She feels weak in the shins and ankles. She doesn't feel like she has strength to push herself up. She doesn't have that spring, feels like she falls down. She is having more proximal weakness, difficulty getting out of low seats.  Difficulty getting up out of low seats. Cannot walk without having to sit after a certain amount of time.  Interval history 12/11/2016: She has a warm hot sensation from the waist down. Radiates from the lateral abdomen to the legs. Feels like a dry heat. Intense heat. Progressive. More weakness in the left leg. Left  side worse than the right side. She denies back pain. She has had falls. The sensation is disturbing. She has urinated on herself. Like putting icy hot on her skin. Lyrica is not helping. Progressive weakness and stiffness. She has fallen. Very unsteady, ataxic. Progressively worsening. Within the last few months. Need MRI cervical spine and thoracic spine  Interval history 07/15/2016: She is doing well. She continues to get botox in the left arm, switching to dysport soon. Spasms are improved. She has swelling in both legs. She is walking well with a cane, she fell the other day she got up too fast as as she was walking she stumbled in the hallway and she fell on the floor. This is the first fall, she became off balance because she had to go to the bathroom and going too fast. No pain. She has swelling and numbness. No pain. She still takes lyrica. She does not need baclofen or benzodiazepines. She feels the botox has significantly helped. Son feels like she is better, she is moving faster, he doesn't have to helpe her in and out of the car as much. They got her a lift chair. They may start physical therapy after the next botox treatment. She would also benefit from occupational in addition to physical therapy for help with use of the left hand especially with cooking.   Interval history 01/10/2016: Patient feels like she has been doing excellent. She is walking much better, she loves physical therapy, she is getting better. Botox with Dr. Dianna Limbo is  significantly helping with range of motion and pain in her left arm. She is working with Benjie Karvonen Hobble in physical therapy. Using a cane but can walk without it. She is doing great with botox. Lyrica helps at night. Tizanidine helps in the morning takes it once a day with breakfast for spasticity but doesn't want to take any more than that due to side effects. She still has difficulty due to her weakness and spasticity with changing positions in bed, and this  week she had an irritation on her back which was treated concerning for skin erosion. Discussed mattresses that can help prevent skin erosion and ulcers. Will request a mattress to try and avoid bed sores. She has a irritation in the lower back now healing, worried about bed sores and will try and request a full size mattress to address this problem. Will talk to [facility]. Patient is here with her son as well. Her weight has stabilized. Son provides information. Had a long talk about what we can do in the future including continuing Botox, continued physical therapy, stretching and strengthening, other medications to help with spasticity and pain, and most important mattress to help address and prevent possible bed sores. Physical therapy ordered an orthosis for her left foot due to her abnormal gait because of foot inversion due to myelopathy, we'll discuss with Dr. Read Drivers and possibly he can help with Botox..   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 She is doing exceptionally well. The spasticity is improving and so it the pain. She still has balance problems but is up walking around with a cane, no falls. Have recommended baclofen for the spasticity but she is worried about taking it. I also recommend Rehab with Dr. Letta Pate and Naaman Plummer as well as botox for the left arm spasticity. The weight loss has stabilized. She needs podiatry for her toe nail problems. I am extremely pleased with her progress after surgery with Dr. Rigoberto Noel at [facility], a remarkable improvement. She has some spasms in the left arm and both legs. The spasms are less with the Lyrica. The Baclofen didn't help.Her right leg has gotten weaker. She lives with her son who cares for her. Sleeping well at night. She has lost weight. She has spasms mostly in the left arm  and mostly in the hand. She needs PT, there was an error in the referral to [place] and Naaman Plummer which has been corrected. Discussed spasticity with patient. Have prescribed baclofen for patient but she is not interested in taking it, even though it can help with her spasticity. Discussed increasing baclofen and she declines, it makes her sleepy and difficult to walk. She is urinating fine, but bowel movements are difficult  Interval update 04/15/2015: Julie Elliott is a 64 y.o. female here as a referral from Dr. Arcelia Jew for progressive gait ataxia, left hand pain and weakness. Julie Elliott is a 64yo woman with PMHx of HTN, systolic CHF, GERD, Type 2 DM, and hyperlipidemia who presents today for the following: Progressive spasticity, weakness, falls, painful paresthesias unintentional significant weight loss of over 100 pounds, decreased mobility. MRI of the cervical cord is significant for T2 hyperintense lesion extending from C4-C7 with focal spinal cord enhancement at C5-C6. She has gone through extensive workup and returns today to review, with son. 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 Ernesto Rutherford), CT of the chest abdomen and pelvis were largely unremarkable and no malignancy was found. Extensive serum testing includes the following unremarkable labs: CMP, CBC, TSH, sedimentation rate, HIV with negative reflex, ANA with rheumatologic comprehensive panel, vitamin B1, heavy metals in the blood, pan-ANCA panel, multiple myeloma panel, vitamin B6, rheumatoid factor, paraneoplastic profile, high sensitivity CRP, hep C antibody,  RPR,, B12 and folate, angiotensin-converting enzyme, hemoglobin A1c, CK, NMO, copper, ceruloplasmin, HTLV-1 and 2 antibodies, JCV,   CSF studies were completed which include normal myelin basic protein, positive CSF oligoclonal pans greater than 5 that are not present in the patient's corresponding serum sample,negative Lyme antibodies, normal CNS IgG index, negative Echo virus antibodies, a high index of varus sellar zoster IgG in the CSF was found at 2518 (upper limit normal of 135) however these antibodies were also found in an higher quantity the serum and so this is not consistent with intrathecal production, negative Epstein-Barr PCR, negative VDRL, negative CMV PCR, negative HSV PCR, still pending VZV DNA PCR, fungus culture with smear in progress, cell count with differential unremarkable, normal glucose and elevated protein.   Repeat MRI cervical cord: FINDINGS: :  On sagittal images, the spine is imaged from above the cervicomedullary junction to T3T4. There is increased signal within the spinal cord from C4-C5 to C7 on T2-weighted and STIR images. There is focal enlargement of the spinal cord adjacent to C5C6 and C6. There is relatively flat enhancement within this region only adjacent to C5C6.   The vertebral bodies are normally aligned. The vertebral bodies have normal signal. The discs and interspaces were further evaluated on axial views from C2 to T1 as follows: C2 - C3: The disc and interspace appear normal. C3 -C4: There is mild uncovertebral spurring and disc bulging causing moderate bilateral foraminal narrowing. There is no definite nerve root compression, but there is some encroachment of the exiting C4 nerve roots. C4 -C5: There is mild bilateral uncovertebral spurring and imaging causing severe bilateral foraminal narrowing. There could be compression of either of the C5 nerve roots. Posteriorly within the spinal cord, there is hyperintense signal on T2  images extending inferiorly.  C5 -C6: There is moderate spinal stenosis due to broad disc protrusion, uncovertebral spurring and facet hypertrophy. There is severe left and very severe right foraminal narrowing leading to right C6 and possible left C6 nerve root compression. The entire spinal cord is hyperintense C6 -C7: There is broad disc protrusion and facet hypertrophy causing severe left and moderate right foraminal narrowing. There is possible left C7 nerve root compression. Abnormal signal is seen in the entire spinal cord at the interspace and medially adjacent to the C7 vertebral body.  C7 -T1: The disc and interspace appear normal. Hyperintense signal is noted medially and anteriorly only, within the spinal cord T1 - T2: The disc and interspace appear normal.   Compared to the MRI dated 03/13/2015, there is no definite change.   IMPRESSION: This is an abnormal MRI of the cervical spine with and without contrast showing: 1. Abnormal T2 hyperintense signal within the spinal cord from C4C5 to C7 with mild expansion of the cord  adjacent to C5C6 to C6. There is "pancake-like" flat enhancement within the spinal cord only adjacent to C5C6. The findings are stable compared to 03/13/2015. 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 Inflammatory, demyelinating and neoplastic etiologies remain in the differential diagnosis 2. At [place], there is severe foraminal narrowing bilaterally that could lead to compression of either c5 nerve root. 3. At C5C6, there is moderate spinal stenosis and very severe right and severe left foraminal narrowing. There is probable right and possible left C6 nerve root compression.  4. At C6C7, there is severe left foraminal narrowing with possible left C7 nerve  root compression  An extensive workup has been completed on patient without another etiology found except for possible compressive spondylosis of the spine. Workup consisted of  Nuclear medicine PET image: FINDINGS: NECK There is increased activity within the spinal cord from the skullbase through C7. There is focal activity at C5-C6 which corresponds to the lesion on comparison MRI. No hypermetabolic cervical lymphadenopathy.   There is asymmetric metabolic activity within the RIGHT vallecula compared to the LEFT (image 24 of the fused data set). This activity is intense with SUV max equal 11.6 compared to prior wall 7.0 on the contralateral side. No discrete mass lesions identified.   Here is a focus of activity at the LEFT small first rib and clavicle junction (image 48 of fused data set) without associated finding on the CT portion.  CHEST  No hypermetabolic mediastinal or hilar nodes. 4 mm on palm lung nodule along the RIGHT oblique fissure on image (image 25 with, series 6).  ABDOMEN/PELVIS  No abnormal hypermetabolic activity within the liver, pancreas, adrenal glands, or spleen. No hypermetabolic lymph nodes in the abdomen or pelvis. Nonobstructing calculus in the LEFT kidney measures 7 mm. Uterus and ovaries are normal.  SKELETON  No focal hypermetabolic activity to suggest skeletal metastasis.  IMPRESSION: 1. Hypermetabolic focus within the spinal cord corresponds to enhancing lesion on comparison cervical MRI. Differential includes inflammatory process, infectious process, or neoplastic process.). 2. Asymmetric metabolic activity in the RIGHT vallecula region of the hypopharynx para the RIGHT. Consider direct visualization if concern for head and neck malignancy. 3. Asymmetric uptake at the junction of the first rib LEFT clavicle is likely degenerative as there is no lesion identified on comparison CT 4. Small nodule along the RIGHT oblique fissure is likely  benign. If the patient is at high risk for bronchogenic carcinoma, follow-up chest CT at 1 year is recommended. If the patient is at low risk, no follow-up is needed. This recommendation follows the consensus statement: Guidelines for Management of Small Pulmonary Nodules Detected on CT Scans: A Statement from the [place] as published in Radiology 2005; 237:395-400.  CT ABDOMEN PELVIS FINDINGS  Hepatobiliary: Cholecystectomy.  Pancreas: Unremarkable  Spleen: Unremarkable  Adrenals/Urinary Tract: 6 mm left mid kidney nonobstructive renal calculus.  Stomach/Bowel: Small periampullary duodenal diverticulum.  Vascular/Lymphatic: Mild aortoiliac atherosclerotic calcification.  Reproductive: Unremarkable  Other: No supplemental non-categorized findings.  CT of the chest, abdomen and pelvis with contrast  Musculoskeletal: Considerable lower lumbar spondylosis and degenerative disc disease thought to be causing impingement at ultiple lumbar levels but most particularly at L5-S1 and L4-5. This appears significantly progressive compared to the exam from 09/14/2006. High density along the anterior epidural tissues at the L5 level probably from disc material or venous  plexus. Spurring of both hips noted.  IMPRESSION: 1. 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 2. The 2-3 mm right upper lobe subpleural lymph node, unchanged from 09/05/2014. This documents 6 months of stability. If the patient is at high risk for bronchogenic carcinoma, follow-up chest CT in 6 months is recommended. If the patient is at low risk, no follow-up is needed. This recommendation follows the consensus statement: Guidelines for Management of Small Pulmonary Nodules Detected on CT Scans: A Statement from the [place] as published in Radiology 2005; 237:395-400. 3. No adenopathy or primary lesion to explain the lesion in the  cervical cord shown on outside MRI. 4. 6 mm nonobstructive left mid kidney renal calculus. 5. Mild atherosclerosis.  MR Brain:  IMPRESSION: Abnormal MRI scan of the brain showing tiny scattered periventricular and subcortical nonspecific white matter hyperintensities with differential discussed above. No enhancing lesions are noted.  MRI Thoracic spine; Unremarkable  Interval update 03/15/2015: Julie Elliott is a 64 y.o. female here as a referral from Dr. Arcelia Jew for progressive gait ataxia, left hand pain and weakness. Julie Elliott is a 64yo woman with PMHx of HTN, systolic CHF, GERD, Type 2 DM, and hyperlipidemia who presents today for the following: Progressive spasticity, weakness, falls, painful paresthesias unintentional significant weight loss decreased mobility. She was scheduled for an EMG nerve conduction today however MRI of the cervical cord today came back significant for T2 hyperintense lesion extending from C4-5 to inferior C7 level with focal spinal cord enhancement at C5-6 level. The spinal cord appears focally enlarged at the C6 level. May represent focal autoimmune, inflammatory, demyelinating, neoplastic or para-infectious etiologies. There is adjacent degenerative disc disease with mild spinal stenosis, and therefore compressive etiology may be considered. However the degree of abnormal cord signal appears to be out of proportion to the degree of compression, and therefore would consider workup of other etiologies.  Discussed at length with patient and son and further workup.  Interval update 03/01/2015: Julie Elliott is a 64 y.o. female here as a follow up. Here with her son.   She is still losing weight. She lost 12-14 pounds in a 2 week period. She is losing muscle mass now. She can't stand up. She can't walk for a long period of time. Her PT and OT was discontinued. Patient reports that they told her they were wasting her time. No incontinence. No problems swallowing. No PBA. No  weakness in facial muscles. No muscle cramping. Still with left-sided arm pain and weakness. Continuous pain. Stiffness in the legs.   Initial visit 12/21/2014: Julie Elliott is a 64 y.o. female here as a referral from Dr. Arcelia Jew for progressive gait ataxia, left hand pain and weakness. Julie Elliott is a 64yo woman with PMHx of HTN, systolic CHF, GERD, Type 2 DM, and hyperlipidemia who presents today for the following:  She is having problems with the left hand. She has joint pain and out of nowhere the hand locked up. Stiffness, arthritic-type pain. Doesn't stop when she sleeps, the pain is continuous all day lone. Progressing and worsening. Left leg is weak. Also burning in the left leg. And coldness in both legs. They were in the ED January first, she felt like she couldn't move. Her legs were cold. She has lost a lot of weight quickly, over 100 pounds in the last 6 months. She eats the same, good appetite. she was told that her symptoms were related to arthritis and diabetic neuropathy. One  month later they were in the ED again, decreased mobility and ataxia. Now she needs a cane to walk. Issues with balance and near falls. She has developed a frozen shoulder. Progressive, incapaciting pain 10/10 with difficulty performing daily tasks such as cleaning, toileting, bathing, cooking. Symptoms are mostly on the left side but her right leg is now affected. Assymmetric. Right leg slightly involved. Weakness as well as pain mostly on the left. She gets very hot. The left hand symptoms were acute, the other symptoms have been insidious and have slowly progressed. She has to have someone bathe her now. Balance is poor. She has not fallen but very off balance. No injuries, no inciting factors. She had stoped taking her insulin in December because of her weight loss she no longer needs it. Son is resent with her and provides much of the information.   EMG/NCS showed severe CTS. Had emg/ncs at [facility] orth[facility].     Reviewed notes, labs and imaging from outside physicians, which showed:  DG Lumbar spine (reviewed images and agree with the following) : No evidence for fracture. Loss of disc height is seen at L3-4 and L5-S1 advanced facet osteoarthritis is seen bilaterally in the lower lumbar levels. No worrisome lytic or sclerotic osseous abnormality.  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  DG Cervical Spine: The cervicothoracic junction is partially obscured by osseous and soft tissue overlap on the lateral view. Cervical spine alignment is maintained. Vertebral body heights are preserved. There is disc space narrowing at C4-C5, C5-C6, and C6-C7 with associated endplate spurs. There is bony neural foraminal narrowing at C5-C6 bilaterally. The dens is intact. Posterior elements appear well-aligned. There is no evidence of fracture. No prevertebral soft tissue edema.  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 Bony neural foraminal narrowing at C5-C6. No acute bony abnormality.  DG Hip: There is no evidence of fracture. There is osteoarthritis of both hips with mild joint space narrowing and marginal osteophytes. Sacroiliac joints appear normal. The patient has a tendency towards calcification at tendon insertions.  IMPRESSION: No acute finding. Osteoarthritis of both hips.  DG Left hand complete: Evaluation of the digits is limited due to flexed positioning. No fracture or dislocation. Mild osteoarthritis of the thumb and index inger metacarpal phalangeal joints with joint space narrowing and osteophytes. The joint spaces are otherwise maintained. There are no erosions or periosteal reaction. No focal soft tissue abnormality.  DG Shoulder: Mild osteoarthritis. No evidence of inflammatory arthropathy or acute bony abnormality.  No fracture or dislocation. The alignment and joint spaces are maintained. There is mild osteoarthritis at  the acromioclavicular joint. There is inferior spurring from the glenoid. No definite abnormal soft tissue calcifications.  IMPRESSION: Acromioclavicular and glenohumeral osteoarthritis. No acute bony abnormality.  She was seen in the ED in February for shoulder pain and limitations in movement. She was referred to Orthopaedics. Per notes, She also has hx of leg pain, knee pain, and hip pain which has been attributed to diabetic neuropathy and arthritis in the past. Has unbalance. CXR hip showed OA of both hips and mild joint space narrowing and osteophytes. SI joint was normal. Lumbar spine, degeneration disc And facet disease onf L3-L4, L5-S1. 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 Exam noted Patient's legs  are cool to the touch. Bilateral ABI was within normal limits. Dxed with likely peripheral neuropathy from her diabetes and possible decreased arterial blood flow to the legs.  HgbA1c 6.8 06/2014  REVIEW OF SYSTEMS: Out of a complete 14 system review of symptoms, the patient complains only of the following symptoms, and all other reviewed systems are negative.  ALLERGIES: Allergies  Allergen Reactions  . Loratadine-Pseudoephedrine Er Hives    Claritin-D    HOME MEDICATIONS: Outpatient Medications Prior to Visit  Medication Sig Dispense Refill  . aspirin 81 MG EC tablet Take 1 tablet (81 mg total) by mouth daily. 90 tablet 3  . atorvastatin (LIPITOR) 40 MG tablet TAKE 1 TABLET ONCE DAILY. 90 tablet 0  . buPROPion (ZYBAN) 150 MG 12 hr tablet Take 1 tablet (150 mg total) by mouth 2 (two) times daily. Take 1 tablet 172m once a day for 3 days and THEN start twice daily 90 tablet 2  . enalapril (VASOTEC) 5 MG tablet TAKE 1 TABLET BY MOUTH TWICE DAILY. 180 tablet 0  .  furosemide (LASIX) 40 MG tablet TAKE 1 TABLET BY MOUTH TWICE DAILY. 180 tablet 0  . metoprolol succinate (TOPROL-XL) 50 MG 24 hr tablet TAKE 1 TABLET ONCE A DAY WITH OR IMMEDIATELY FOLLOWING A MEAL. 90 tablet 0  . potassium chloride SA (KLOR-CON) 20 MEQ tablet TAKE 1 TABLET BY MOUTH DAILY. 60 tablet 0  . pregabalin (LYRICA) 75 MG capsule TAKE 1 CAPSULE IN THE MORNING, 1 CAPSULE IN THE EVENING AND 2 AT BEDTIME 120 capsule 5  . spironolactone (ALDACTONE) 25 MG tablet TAKE 1 TABLET ONCE DAILY. 90 tablet 1   No facility-administered medications prior to visit.    PAST MEDICAL HISTORY: Past Medical History:  Diagnosis Date  . [place]   . Chronic headaches   . DJD (degenerative joint disease)   . DM (diabetes mellitus) ([place] 2008   stable HgBA1C at 6.5  . GERD (gastroesophageal reflux disease)    well controlled on Omeprazole  . HLD (hyperlipidemia) 2008   stable, well controlled  . Hypertension    well controlled    PAST SURGICAL HISTORY: Past Surgical History:  Procedure Laterality Date  . carpel tunnel Right    20 yrs ago  . CERVICAL SPINE SURGERY  2016  . TUBAL LIGATION      FAMILY HISTORY: Family History  Problem Relation Age of Onset  . Diabetes Mother   . Heart disease Mother   . Prostate cancer Father   . Neuropathy Neg Hx     SOCIAL HISTORY: Social History   Socioeconomic History  . Marital status: Single    Spouse name: Not on file  . Number of children: 3  . Years of education: 173 . Highest education level: Not on file  Occupational History  . Occupation: Unemployed  Tobacco Use  . Smoking status: Current Some Day Smoker    Types: Cigarettes    Last attempt to quit: 04/18/2013    Years since quitting: 7.0  . Smokeless tobacco: Current User  . Tobacco comment: 1-2 cigs in the a.m.  Vaping Use  . Vaping Use: Never used  Substance and Sexual Activity  . Alcohol use: No    Alcohol/week: 0.0 standard drinks  . Drug use: No   . Sexual activity: Not on file  Other Topics Concern  . Not on file  Social History Narrative   to get supplies nfrom [facility] per pt request.1--[phone]  Lives at home with son.    Right handed.   Caffeine use: drinks coffee-rare   Social Determinants of Health   Financial Resource Strain:   . Difficulty of Paying Living Expenses: Not on file  Food Insecurity:   . Worried About Charity fundraiser in the Last Year: Not on file  . Ran Out of Food in the Last Year: Not on file  Transportation Needs:   . Lack of Transportation (Medical): Not on file  . Lack of Transportation (Non-Medical): Not on file  Physical Activity:   . Days of Exercise per Week: Not on file  . Minutes of Exercise per Session: Not on file  Stress:   . Feeling of Stress : Not on file  Social Connections:   . Frequency of Communication with Friends and Family: Not on file  . Frequency of Social Gatherings with Friends and Family: Not on file  . Attends Religious Services: Not on file  . Active Member of Clubs or Organizations: Not on file  . Attends Archivist Meetings: Not on file  . Marital Status: Not on file  Intimate Partner Violence:   . Fear of Current or Ex-Partner: Not on file  . Emotionally Abused: Not on file  . Physically Abused: Not on file  . Sexually Abused: Not on file      PHYSICAL EXAM Generalized: Well developed, in no acute distress   Neurological examination  Mentation: Alert oriented to time, place, history taking. Follows all commands speech and language fluent Cranial nerve II-XII:Extraocular movements were full. Facial symmetry noted. uvula tongue midline. Head turning and shoulder shrug  were normal and symmetric. Motor: Good strength throughout subjectively per patient Sensory: Sensory testing is intact to soft touch on all 4 extremities subjectively per patient    DIAGNOSTIC DATA (LABS, IMAGING, TESTING) - I reviewed patient records, labs, notes, testing  and imaging myself where available.  Lab Results  Component Value Date   WBC 6.5 11/28/2019   HGB 14.4 11/28/2019   HCT 43.3 11/28/2019   MCV 87 11/28/2019   PLT 203 11/28/2019      Component Value Date/Time   NA 140 11/28/2019 1633   K 2.9 (L) 11/28/2019 1633   CL 101 11/28/2019 1633   CO2 24 11/28/2019 1633   GLUCOSE 319 (H) 11/28/2019 1633   GLUCOSE 99 11/03/2014 1600   BUN 5 (L) 11/28/2019 1633   CREATININE 0.63 11/28/2019 1633   CREATININE 0.53 11/03/2014 1600   CALCIUM 8.3 (L) 11/28/2019 1633   PROT 7.0 01/10/2016 1523   ALBUMIN 4.0 01/10/2016 1523   AST 13 01/10/2016 1523   ALT 11 01/10/2016 1523   ALKPHOS 78 01/10/2016 1523   BILITOT 0.3 01/10/2016 1523   GFRNONAA 97 11/28/2019 1633   GFRNONAA >89 11/03/2014 1600   GFRAA 112 11/28/2019 1633   GFRAA >89 11/03/2014 1600   Lab Results  Component Value Date   CHOL 158 03/30/2018   HDL 43 03/30/2018   LDLCALC 100 (H) 03/30/2018   TRIG 75 03/30/2018   CHOLHDL 3.7 03/30/2018   Lab Results  Component Value Date   HGBA1C 5.2 02/26/2018   Lab Results  Component Value Date   VITAMINB12 542 12/21/2014   Lab Results  Component Value Date   TSH 1.380 03/30/2018      ASSESSMENT AND PLAN 64 y.o. year old female  has a past medical history of CHF (congestive heart failure) ([place]), Chronic headaches, DJD (degenerative joint disease), DM (diabetes mellitus) (  [place]) (2008), GERD (gastroesophageal reflux disease), HLD (hyperlipidemia) (2008), and Hypertension. here with:  Cervical spondylosis with myelopathy Paraparesis Spinal stenosis of the lumbar region  Patient will continue on Lyrica 75 mg twice a day.  Advised that if her primary care is amenable they can refill this from here on out.  Patient voiced understanding.  She will follow-up in their office on an as-needed basis.  I spent 25  minutes of face-to-face and non-face-to-face time with patient.  This included previsit chart review, lab review, study review,  order entry, electronic health record documentation, patient education.    Ward Givens, MSN, NP-C 04/25/2020, 2:49 PM [name] [address], [place], [place] [address] [phone]  Made any corrections needed, and agree with history, physical, neuro exam,assessment and plan as stated.     Sarina Ill, MD [name]

## 2020-04-26 NOTE — Progress Notes (Signed)
Subjective:  Patient ID: Julie Elliott, female    DOB: 1955-10-19,  MRN: [id number]  64 y.o. female presents with at risk foot care with history of diabetic neuropathy and painful thick toenails that are difficult to trim. Pain interferes with ambulation. Aggravating factors include wearing enclosed shoe gear. Pain is relieved with periodic professional debridement.   Her son is present during today's visit.   PCP is Dr. Mosetta Anis. Last visit was 11/28/2019.  Review of Systems: Negative except as noted in the HPI.  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[place]) 03/30/2018   Breast cancer screening by mammogram 04/28/2017   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   Tobacco abuse 08/16/2015   Peripheral neuropathy 07/17/2015   Status post cervical arthrodesis 07/02/2015   Cervical cord compression with myelopathy ([place]) 02/08/2015   Ataxia 02/08/2015   Frozen shoulder--left 09/05/2014   Health care maintenance 03/23/2012   Essential hypertension 08/17/2006   GERD 08/17/2006   History of diabetes mellitus, type II 08/11/2006   Hyperlipidemia 08/11/2006   Osteoarthritis 08/11/2006    Current Outpatient Medications:    aspirin 81 MG EC tablet, Take 1 tablet (81 mg total) by mouth daily., Disp: 90 tablet, Rfl: 3   atorvastatin (LIPITOR) 40 MG tablet, TAKE 1 TABLET ONCE DAILY., Disp: 90  tablet, Rfl: 0   buPROPion (ZYBAN) 150 MG 12 hr tablet, Take 1 tablet (150 mg total) by mouth 2 (two) times daily. Take 1 tablet 150mg  once a day for 3 days and THEN start twice daily, Disp: 90 tablet, Rfl: 2   enalapril (VASOTEC) 5 MG tablet, TAKE 1 TABLET BY MOUTH TWICE DAILY., Disp: 180 tablet, Rfl: 0   furosemide (LASIX) 40 MG tablet, TAKE 1 TABLET BY MOUTH TWICE DAILY., Disp: 180 tablet, Rfl: 0   metoprolol succinate (TOPROL-XL) 50 MG 24 hr tablet, TAKE 1 TABLET ONCE A DAY WITH OR IMMEDIATELY FOLLOWING A MEAL., Disp: 90 tablet, Rfl: 0   potassium chloride SA (KLOR-CON) 20 MEQ tablet, TAKE 1 TABLET BY MOUTH DAILY., Disp: 60 tablet, Rfl: 0   pregabalin (LYRICA) 75 MG capsule, TAKE 1 CAPSULE IN THE MORNING, 1 CAPSULE IN THE EVENING AND 2 AT BEDTIME, Disp: 120 capsule, Rfl: 5   spironolactone (ALDACTONE) 25 MG tablet, TAKE 1 TABLET ONCE DAILY., Disp: 90 tablet, Rfl: 1 Allergies  Allergen Reactions   Loratadine-Pseudoephedrine Er Hives    Claritin-D   Social History   Tobacco Use  Smoking Status Current Some Day Smoker   Types: Cigarettes   Last attempt to quit: 04/18/2013   Years since quitting: 7.0  Smokeless Tobacco Current User  Tobacco Comment   1-2 cigs in the a.m.   Objective:  There were no vitals filed for this visit. Constitutional Patient is a pleasant 64 y.o. African American female morbidly obese in NAD.Marland Kitchen AAO x 3.  Vascular Capillary refill time to digits immediate b/l. Palpable pedal pulses b/l LE. Pedal hair sparse. Lower extremity skin temperature gradient within normal limits. Nonpitting edema noted b/l lower extremities. No cyanosis or clubbing noted.  Neurologic Normal speech. Oriented to person, place, and time. Pt has subjective symptoms of neuropathy. Protective sensation intact 5/5 intact bilaterally with 10g monofilament b/l. Vibratory sensation intact b/l. Proprioception intact bilaterally.  Dermatologic Pedal skin with normal turgor, texture and  tone bilaterally. No open wounds bilaterally. No interdigital macerations bilaterally. Toenails 1-5 b/l elongated, discolored, dystrophic, thickened, crumbly with subungual debris and tenderness to dorsal palpation.  Orthopedic: Normal muscle strength 5/5 to all lower extremity muscle groups bilaterally. No pain crepitus or joint limitation noted with ROM b/l. No gross bony deformities bilaterally.   Assessment:   1. Pain due to onychomycosis of toenails of both feet   2. Diabetic peripheral neuropathy associated with type 2 diabetes mellitus ([place])    Plan:  Patient was evaluated and treated and all questions answered. Onychomycosis with pain -Nails palliatively debridement as below. -Educated on self-care  Procedure: Nail Debridement Rationale: Pain Type of Debridement: manual, sharp debridement. Instrumentation: Nail nipper, rotary burr. Number of Nails: 10  -Examined patient. -No new findings. No new orders. -Continue diabetic foot care principles. -Toenails 1-5 b/l were debrided in length and girth with sterile nail nippers and dremel without iatrogenic bleeding.  -Patient to report any pedal injuries to medical professional immediately. -Patient to continue soft, supportive shoe gear daily. -Patient/POA to call should there be question/concern in the interim.  Return in about 3 months (around 07/23/2020).  Marzetta Board, DPM

## 2020-05-03 ENCOUNTER — Other Ambulatory Visit: Payer: Self-pay | Admitting: Internal Medicine

## 2020-05-03 DIAGNOSIS — I5042 Chronic combined systolic (congestive) and diastolic (congestive) heart failure: Secondary | ICD-10-CM

## 2020-05-03 DIAGNOSIS — Z720 Tobacco use: Secondary | ICD-10-CM

## 2020-05-04 ENCOUNTER — Encounter: Payer: Medicare Other | Admitting: Internal Medicine

## 2020-05-07 ENCOUNTER — Ambulatory Visit (INDEPENDENT_AMBULATORY_CARE_PROVIDER_SITE_OTHER): Payer: Medicare Other | Admitting: Internal Medicine

## 2020-05-07 ENCOUNTER — Encounter: Payer: Self-pay | Admitting: Internal Medicine

## 2020-05-07 ENCOUNTER — Other Ambulatory Visit: Payer: Self-pay

## 2020-05-07 ENCOUNTER — Encounter: Payer: Medicare Other | Admitting: Internal Medicine

## 2020-05-07 VITALS — BP 131/70 | HR 74 | Temp 98.4°F | Wt 242.4 lb

## 2020-05-07 DIAGNOSIS — I5042 Chronic combined systolic (congestive) and diastolic (congestive) heart failure: Secondary | ICD-10-CM | POA: Diagnosis not present

## 2020-05-07 DIAGNOSIS — Z23 Encounter for immunization: Secondary | ICD-10-CM

## 2020-05-07 DIAGNOSIS — Z8639 Personal history of other endocrine, nutritional and metabolic disease: Secondary | ICD-10-CM

## 2020-05-07 DIAGNOSIS — E1165 Type 2 diabetes mellitus with hyperglycemia: Secondary | ICD-10-CM

## 2020-05-07 DIAGNOSIS — G8250 Quadriplegia, unspecified: Secondary | ICD-10-CM

## 2020-05-07 NOTE — Progress Notes (Signed)
CC: Heart failure  HPI: Julie Elliott is a 64 y.o. with PMH listed below presenting with complaint of leg swelling. Please see problem based assessment and plan for further details.  Past Medical History:  Diagnosis Date  . CHF (congestive heart failure) ([place])   . Chronic headaches   . DJD (degenerative joint disease)   . DM (diabetes mellitus) ([place]) 2008   stable HgBA1C at 6.5  . GERD (gastroesophageal reflux disease)    well controlled on Omeprazole  . HLD (hyperlipidemia) 2008   stable, well controlled  . Hypertension    well controlled   Review of Systems: Review of Systems  Constitutional: Negative for chills, fever and malaise/fatigue.  Eyes: Negative for blurred vision.  Respiratory: Negative for shortness of breath.   Cardiovascular: Positive for leg swelling. Negative for chest pain and palpitations.  Gastrointestinal: Negative for constipation, diarrhea, nausea and vomiting.  Musculoskeletal: Positive for back pain and joint pain.     Physical Exam: Vitals:   05/07/20 1610  BP: 131/70  Pulse: 74  Temp: 98.4 F (36.9 C)  TempSrc: Oral  SpO2: 97%  Weight: 242 lb 6.4 oz (110 kg)   Gen: Well-developed, well nourished, NAD HEENT: NCAT head, hearing intact CV: RRR, S1, S2 normal Pulm: CTAB, No rales, no wheezes Extm: ROM intact, Peripheral pulses intact, Stable fixed contracture of LUE, Bilateral trace ankle edema Skin: Dry, Warm, normal turgor, no wounds, no rashes, no lesions  Assessment & Plan:   Need for COVID-19 vaccine Currently unvaccinated. Had prolonged conversation regarding importance of protection against COVID-19. Son present also describes multiple members who had recent hospitalizations for COVID. Encouraged Julie Elliott to set up appointment for COVID-19 vaccination, which she is agreeable to.  History of diabetes mellitus, type II Lab Results  Component Value Date   HGBA1C 5.2 02/26/2018   Previously had diabetes but had resolved with  weight loss. Most recent hgb a1c on 2019 within normal range at 5.2. However, most recent labs show blood glucose >300 which meets criteria for diabetes. Likely need to start diabetic meds. Currently denies any polyuria, polydipsia, polyphagia. Not interested in starting diabetic meds at this time.   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 Follows with [facility] neurology. Received dysport injections. She needs a new bedside commodes as well as a new hospital bed due to neurologic sequelae of her disease. Especially with LUE contracture disabling use of her left hand as well as quadriparesis that cause her to have difficulty utilizing regular toilet. Without assistance, she is confined to single room at home as she requires assistance for mobility. She also has difficulty with orientation due to her quadriparesis and requires a hospital bed for orientation.  - Hospital bed, bedside commode ordered - C/w lyrica - F/u with nuerology  Combined systolic and diastolic congestive heart failure ([place]) Julie Elliott is a 64 yo F w/ PMH of combined systolic/diastolic heart failure EF 40-45%, DJD, HTN, cervical myelopathy s/p arthrodesis presenting to [facility] for management of her chronic conditions. She has been seeing her cardiologist, Dr.Chistopher, regularly. She has noted continued ankle edema and also endorse ankle pain but associates this with the rain and exacerbation of her DJD.  Current weight in clinic 242. Unchanged from prior. Stable. No respiratory distress. Was put on Potassium supplementation last visit due to hypokalemia in  setting of diuretic use. Will check electrolytes this visit.  - BMP - C/w furosemide 40mg  BID, spironolactone 25mg  daily, metoprolol 50mg  daily, enalapril 5mg  BID    Patient discussed with Dr.  Daryll Drown  -Gilberto Better, [facility] Pager: [phone]

## 2020-05-07 NOTE — Patient Instructions (Signed)
Dear Ms.[name],  Thank you for allowing [place] to provide your care today. Today we discussed your heart failure and your need for COVID vaccine    I have ordered bmp, magnesium labs for you. I will call if any are abnormal.    Today we made no changes to your medications  Please follow-up in 3 months.    Should you have any questions or concerns please call the internal medicine clinic at [phone].    Thank you for choosing [facility].   Heart Failure, Self Care Heart failure is a serious condition. This sheet explains things you need to do to take care of yourself at home. To help you stay as healthy as possible, you may be asked to change your diet, take certain medicines, and make other changes in your life. Your doctor may also give you more specific instructions. If you have problems or questions, call your doctor. What are the risks? Having heart failure makes it more likely for you to have some problems. These problems can get worse if you do not take good care of yourself. Problems may include:  Blood clotting problems. This may cause a stroke.  Damage to the kidneys, liver, or lungs.  Abnormal heart rhythms. Supplies needed:  Scale for weighing yourself.  Blood pressure monitor.  Notebook.  Medicines. How to care for yourself when you have heart failure Medicines Take over-the-counter and prescription medicines only as told by your doctor. Take your medicines every day.  Do not stop taking your medicine unless your doctor tells you to do so.  Do not skip any medicines.  Get your prescriptions refilled before you run out of medicine. This is important. Eating and drinking   Eat heart-healthy foods. Talk with a diet specialist (dietitian) to create an eating plan.  Choose foods that: ? Have no trans fat. ? Are low in saturated fat and cholesterol.  Choose healthy foods, such as: ? Fresh or frozen fruits and vegetables. ? Fish. ? Low-fat (lean)  meats. ? Legumes, such as beans, peas, and lentils. ? Fat-free or low-fat dairy products. ? Whole-grain foods. ? High-fiber foods.  Limit salt (sodium) if told by your doctor. Ask your diet specialist to tell you which seasonings are healthy for your heart.  Cook in healthy ways instead of frying. Healthy ways of cooking include roasting, grilling, broiling, baking, poaching, steaming, and stir-frying.  Limit how much fluid you drink, if told by your doctor. Alcohol use  Do not drink alcohol if: ? Your doctor tells you not to drink. ? Your heart was damaged by alcohol, or you have very bad heart failure. ? You are pregnant, may be pregnant, or are planning to become pregnant.  If you drink alcohol: ? Limit how much you use to:  0-1 drink a day for women.  0-2 drinks a day for men. ? Be aware of how much alcohol is in your drink. In the [place] one drink equals one 12 oz bottle of beer (355 mL), one 5 oz glass of wine (148 mL), or one 1 oz glass of hard liquor (44 mL). Lifestyle   Do not use any products that contain nicotine or tobacco, such as cigarettes, e-cigarettes, and chewing tobacco. If you need help quitting, ask your doctor. ? Do not use nicotine gum or patches before talking to your doctor.  Do not use illegal drugs.  Lose weight if told by your doctor.  Do physical activity if told by your doctor. Talk  to your doctor before you begin an exercise if: ? You are an older adult. ? You have very bad heart failure.  Learn to manage stress. If you need help, ask your doctor.  Get rehab (rehabilitation) to help you stay independent and to help with your quality of life.  Plan time to rest when you get tired. Check weight and blood pressure   Weigh yourself every day. This will help you to know if fluid is building up in your body. ? Weigh yourself every morning after you pee (urinate) and before you eat breakfast. ? Wear the same amount of clothing each  time. ? Write down your daily weight. Give your record to your doctor.  Check and write down your blood pressure as told by your doctor.  Check your pulse as told by your doctor. Dealing with very hot and very cold weather  If it is very hot: ? Avoid activities that take a lot of energy. ? Use air conditioning or fans, or find a cooler place. ? Avoid caffeine and alcohol. ? Wear clothing that is loose-fitting, lightweight, and light-colored.  If it is very cold: ? Avoid activities that take a lot of energy. ? Layer your clothes. ? Wear mittens or gloves, a hat, and a scarf when you go outside. ? Avoid alcohol. Follow these instructions at home:  Stay up to date with shots (vaccines). Get pneumococcal and flu (influenza) shots.  Keep all follow-up visits as told by your doctor. This is important. Contact a doctor if:  You gain weight quickly.  You have increasing shortness of breath.  You cannot do your normal activities.  You get tired easily.  You cough a lot.  You don't feel like eating or feel like you may vomit (nauseous).  You become puffy (swell) in your hands, feet, ankles, or belly (abdomen).  You cannot sleep well because it is hard to breathe.  You feel like your heart is beating fast (palpitations).  You get dizzy when you stand up. Get help right away if:  You have trouble breathing.  You or someone else notices a change in your behavior, such as having trouble staying awake.  You have chest pain or discomfort.  You pass out (faint). These symptoms may be an emergency. Do not wait to see if the symptoms will go away. Get medical help right away. Call your local emergency services (911 in the [place] Do not drive yourself to the hospital. Summary  Heart failure is a serious condition. To care for yourself, you may have to change your diet, take medicines, and make other lifestyle changes.  Take your medicines every day. Do not stop taking them  unless your doctor tells you to do so.  Eat heart-healthy foods, such as fresh or frozen fruits and vegetables, fish, lean meats, legumes, fat-free or low-fat dairy products, and whole-grain or high-fiber foods.  Ask your doctor if you can drink alcohol. You may have to stop alcohol use if you have very bad heart failure.  Contact your doctor if you gain weight quickly or feel that your heart is beating too fast. Get help right away if you pass out, or have chest pain or trouble breathing. This information is not intended to replace advice given to you by your health care provider. Make sure you discuss any questions you have with your health care provider. Document Revised: 11/02/2018 Document Reviewed: 11/03/2018 Elsevier Patient Education  [place].

## 2020-05-08 ENCOUNTER — Telehealth: Payer: Self-pay | Admitting: Internal Medicine

## 2020-05-08 ENCOUNTER — Encounter: Payer: Self-pay | Admitting: Internal Medicine

## 2020-05-08 DIAGNOSIS — Z23 Encounter for immunization: Secondary | ICD-10-CM | POA: Insufficient documentation

## 2020-05-08 LAB — BMP8+ANION GAP
Anion Gap: 18 mmol/L (ref 10.0–18.0)
BUN/Creatinine Ratio: 10 — ABNORMAL LOW (ref 12–28)
BUN: 8 mg/dL (ref 8–27)
CO2: 23 mmol/L (ref 20–29)
Calcium: 8.7 mg/dL (ref 8.7–10.3)
Chloride: 97 mmol/L (ref 96–106)
Creatinine, Ser: 0.83 mg/dL (ref 0.57–1.00)
GFR calc Af Amer: 87 mL/min/{1.73_m2} (ref >59)
GFR calc non Af Amer: 76 mL/min/{1.73_m2} (ref >59)
Glucose: 293 mg/dL — ABNORMAL HIGH (ref 65–99)
Potassium: 4.1 mmol/L (ref 3.5–5.2)
Sodium: 138 mmol/L (ref 134–144)

## 2020-05-08 LAB — MAGNESIUM: Magnesium: 2 mg/dL (ref 1.6–2.3)

## 2020-05-08 NOTE — Assessment & Plan Note (Signed)
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 Follows with [facility] neurology. Received dysport injections. She needs a new bedside commodes as well as a new hospital bed due to neurologic sequelae of her disease. Especially with LUE contracture disabling use of her left hand as well as quadriparesis that cause her to have difficulty utilizing regular toilet. Without assistance, she is confined to single room at home as she requires assistance for mobility. She also has difficulty with orientation due to her quadriparesis and requires a hospital bed for orientation.  [facility] bed, bedside commode ordered - C/w lyrica - F/u with nuerology

## 2020-05-08 NOTE — Assessment & Plan Note (Signed)
Julie Elliott is a 64 yo F w/ PMH of combined systolic/diastolic heart failure EF 40-45%, DJD, HTN, cervical myelopathy s/p arthrodesis presenting to [facility] for management of her chronic conditions. She has been seeing her cardiologist, Dr.Chistopher, regularly. She has noted continued ankle edema and also endorse ankle pain but associates this with the rain and exacerbation of her DJD.  Current weight in clinic 242. Unchanged from prior. Stable. No respiratory distress. Was put on Potassium supplementation last visit due to hypokalemia in setting of diuretic use. Will check electrolytes this visit.  - BMP - C/w furosemide 40mg  BID, spironolactone 25mg  daily, metoprolol 50mg  daily, enalapril 5mg  BID

## 2020-05-08 NOTE — Assessment & Plan Note (Signed)
Lab Results  Component Value Date   HGBA1C 5.2 02/26/2018   Previously had diabetes but had resolved with weight loss. Most recent hgb a1c on 2019 within normal range at 5.2. However, most recent labs show blood glucose >300 which meets criteria for diabetes. Likely need to start diabetic meds. Currently denies any polyuria, polydipsia, polyphagia. Not interested in starting diabetic meds at this time.   - Check hgb a1c at next visit - C/w to encourage weight loss - C/w to encourage starting diabetic regimen

## 2020-05-08 NOTE — Assessment & Plan Note (Signed)
Currently unvaccinated. Had prolonged conversation regarding importance of protection against COVID-19. Son present also describes multiple members who had recent hospitalizations for COVID. Encouraged Julie Elliott to set up appointment for COVID-19 vaccination, which she is agreeable to.

## 2020-05-08 NOTE — Telephone Encounter (Signed)
Spoke with Julie Elliott and discussed need to assess for diabetes in setting of persistent hyperglycemia on recent BMP. Julie Elliott expressed understanding.

## 2020-05-09 ENCOUNTER — Telehealth: Payer: Self-pay | Admitting: *Deleted

## 2020-05-09 NOTE — Telephone Encounter (Signed)
I've re-ordered it from the clinic encounter. Please let me know if anything else needs to be revised.

## 2020-05-09 NOTE — Addendum Note (Signed)
Addended by: Mosetta Anis on: 05/09/2020 03:24 PM   Modules accepted: Orders

## 2020-05-09 NOTE — Telephone Encounter (Signed)
CM sent to Skeet Latch at [facility] for hospital bed and [facility]. F2F was 05/07/2020. Hubbard Hartshorn, BSN, RN-BC

## 2020-05-09 NOTE — Telephone Encounter (Addendum)
Orders for hospital bed and BSC will need to be changed. Please select DME Hospital Bed and answer questions that are embedded in order. Same for DME Bedside [facility]. Thank you. Hubbard Hartshorn, BSN, RN-BC

## 2020-05-10 NOTE — Progress Notes (Signed)
Internal Medicine Clinic Attending  Case discussed with Dr. Lee  At the time of the visit.  We reviewed the resident's history and exam and pertinent patient test results.  I agree with the assessment, diagnosis, and plan of care documented in the resident's note.    

## 2020-05-10 NOTE — Telephone Encounter (Signed)
Theophilus Bones, RN; Sandi Raveling, Charlotte Sanes   Thanks Lauren!

## 2020-05-20 ENCOUNTER — Encounter: Payer: Self-pay | Admitting: Cardiology

## 2020-05-22 ENCOUNTER — Inpatient Hospital Stay: Admit: 2020-05-22 | Primary: Registered Nurse

## 2020-05-22 LAB — SENTARA SPECIMEN COLLN.

## 2020-05-24 ENCOUNTER — Encounter

## 2020-06-04 ENCOUNTER — Other Ambulatory Visit: Payer: Self-pay | Admitting: Student

## 2020-06-05 ENCOUNTER — Other Ambulatory Visit: Payer: Self-pay | Admitting: Internal Medicine

## 2020-06-05 DIAGNOSIS — I5042 Chronic combined systolic (congestive) and diastolic (congestive) heart failure: Secondary | ICD-10-CM

## 2020-06-14 ENCOUNTER — Other Ambulatory Visit: Payer: Self-pay | Admitting: Internal Medicine

## 2020-06-14 DIAGNOSIS — I5042 Chronic combined systolic (congestive) and diastolic (congestive) heart failure: Secondary | ICD-10-CM

## 2020-06-27 ENCOUNTER — Encounter

## 2020-06-27 ENCOUNTER — Inpatient Hospital Stay: Admit: 2020-06-27 | Payer: PRIVATE HEALTH INSURANCE | Attending: Registered Nurse | Primary: Registered Nurse

## 2020-06-27 DIAGNOSIS — Z1231 Encounter for screening mammogram for malignant neoplasm of breast: Secondary | ICD-10-CM

## 2020-07-02 ENCOUNTER — Other Ambulatory Visit: Payer: Self-pay | Admitting: Internal Medicine

## 2020-07-02 ENCOUNTER — Other Ambulatory Visit: Payer: Self-pay | Admitting: Student

## 2020-07-02 DIAGNOSIS — I5042 Chronic combined systolic (congestive) and diastolic (congestive) heart failure: Secondary | ICD-10-CM

## 2020-07-02 DIAGNOSIS — E782 Mixed hyperlipidemia: Secondary | ICD-10-CM

## 2020-07-04 ENCOUNTER — Other Ambulatory Visit: Payer: Self-pay | Admitting: Internal Medicine

## 2020-07-05 ENCOUNTER — Other Ambulatory Visit: Payer: Self-pay | Admitting: Internal Medicine

## 2020-07-05 ENCOUNTER — Telehealth: Payer: Self-pay | Admitting: *Deleted

## 2020-07-05 MED ORDER — BUPROPION HCL ER (SR) 150 MG PO TB12: 150.0000 mg | ORAL_TABLET | Freq: Two times a day (BID) | ORAL | 2 refills | Status: DC

## 2020-07-05 NOTE — Telephone Encounter (Signed)
Call from [facility] [place] [facility] stated Bupropion (Zyban) is on back order. Wellbutrin is available. Please send new rx Thanks

## 2020-07-05 NOTE — Telephone Encounter (Signed)
I sent in a new prescription. Thanks

## 2020-07-31 ENCOUNTER — Inpatient Hospital Stay: Admit: 2020-07-31 | Primary: Registered Nurse

## 2020-07-31 LAB — SENTARA SPECIMEN COLLN.

## 2020-08-01 LAB — HEMOGLOBIN A1C
Estimated Avg Glucose, External: 118 mg/dL (ref 91–123)
Hemoglobin A1C, External: 5.7 % — ABNORMAL HIGH (ref 4.8–5.6)

## 2020-08-08 ENCOUNTER — Other Ambulatory Visit: Payer: Self-pay | Admitting: Internal Medicine

## 2020-08-08 DIAGNOSIS — I5042 Chronic combined systolic (congestive) and diastolic (congestive) heart failure: Secondary | ICD-10-CM

## 2020-08-24 ENCOUNTER — Ambulatory Visit (INDEPENDENT_AMBULATORY_CARE_PROVIDER_SITE_OTHER): Payer: Medicare Other | Admitting: Podiatry

## 2020-08-24 ENCOUNTER — Other Ambulatory Visit: Payer: Self-pay

## 2020-08-24 DIAGNOSIS — E1142 Type 2 diabetes mellitus with diabetic polyneuropathy: Secondary | ICD-10-CM | POA: Diagnosis not present

## 2020-08-24 DIAGNOSIS — M79674 Pain in right toe(s): Secondary | ICD-10-CM | POA: Diagnosis not present

## 2020-08-24 DIAGNOSIS — L853 Xerosis cutis: Secondary | ICD-10-CM | POA: Diagnosis not present

## 2020-08-24 DIAGNOSIS — B353 Tinea pedis: Secondary | ICD-10-CM

## 2020-08-24 DIAGNOSIS — B351 Tinea unguium: Secondary | ICD-10-CM

## 2020-08-24 DIAGNOSIS — L84 Corns and callosities: Secondary | ICD-10-CM | POA: Diagnosis not present

## 2020-08-24 DIAGNOSIS — M79675 Pain in left toe(s): Secondary | ICD-10-CM

## 2020-08-24 NOTE — Patient Instructions (Signed)
Spray Athlete's Foot Powder spray between toes once daily.   For extremely dry, cracked feet: moisturize feet once daily; do not apply between toes A. CeraVe Healing Ointment B. Aquaphor Healing Ointment C. Vaseline Petroleum Healing Jelly   If you have problems reaching your feet: apply to feet once daily; do not apply between toes A.  Aquaphor Advanced Therapy Ointment Body Spray B.  Vaseline Intensive Care Spray Lotion Advanced Repair

## 2020-08-27 ENCOUNTER — Encounter: Payer: Self-pay | Admitting: Podiatry

## 2020-08-27 NOTE — Progress Notes (Signed)
Subjective:  Patient ID: Julie Elliott, female    DOB: 1956-07-11,  MRN: [id number]  65 y.o. female presents with at risk foot care with history of diabetic neuropathy and painful thick toenails that are difficult to trim. Pain interferes with ambulation. Aggravating factors include wearing enclosed shoe gear. Pain is relieved with periodic professional debridement.   Her son is present during today's visit.   PCP is Dr. Mosetta Anis. Last visit was 11/28/2019.  Review of Systems: Negative except as noted in the HPI.  Past Medical History:  Diagnosis Date  . CHF (congestive heart failure) ([place])   . Chronic headaches   . DJD (degenerative joint disease)   . DM (diabetes mellitus) ([place]) 2008   stable HgBA1C at 6.5  . GERD (gastroesophageal reflux disease)    well controlled on Omeprazole  . HLD (hyperlipidemia) 2008   stable, well controlled  . Hypertension    well controlled   Past Surgical History:  Procedure Laterality Date  . carpel tunnel Right    20 yrs ago  . CERVICAL SPINE SURGERY  2016  . TUBAL LIGATION     Patient Active Problem List   Diagnosis Date Noted  . Need for COVID-19 vaccine 05/08/2020  . 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 Breast cancer screening by mammogram 04/28/2017  . 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 Tobacco abuse 08/16/2015  . Peripheral neuropathy 07/17/2015  . Status post cervical arthrodesis 07/02/2015  . Cervical cord compression with myelopathy ([place]) 02/08/2015  . Ataxia 02/08/2015  . Frozen shoulder--left 09/05/2014  . Health care maintenance 03/23/2012  . Essential hypertension 08/17/2006  . GERD 08/17/2006  . History of diabetes mellitus, type II 08/11/2006  . Hyperlipidemia 08/11/2006  . Osteoarthritis 08/11/2006    Current Outpatient Medications:  .  aspirin 81 MG EC tablet, Take 1 tablet (81 mg total) by mouth daily., Disp: 90 tablet, Rfl: 3 .  atorvastatin (LIPITOR) 40 MG tablet,  TAKE 1 TABLET ONCE DAILY., Disp: 90 tablet, Rfl: 0 .  buPROPion (WELLBUTRIN SR) 150 MG 12 hr tablet, Take 1 tablet (150 mg total) by mouth 2 (two) times daily., Disp: 60 tablet, Rfl: 2 .  enalapril (VASOTEC) 5 MG tablet, TAKE 1 TABLET BY MOUTH TWICE DAILY., Disp: 180 tablet, Rfl: 0 .  furosemide (LASIX) 40 MG tablet, TAKE 1 TABLET BY MOUTH TWICE DAILY., Disp: 180 tablet, Rfl: 0 .  metoprolol succinate (TOPROL-XL) 50 MG 24 hr tablet, TAKE 1 TABLET DAILY WITH OR IMMEDIATELY FOLLOWING A MEAL., Disp: 90 tablet, Rfl: 0 .  potassium chloride SA (KLOR-CON) 20 MEQ tablet, TAKE 1 TABLET BY MOUTH DAILY., Disp: 90 tablet, Rfl: 1 .  pregabalin (LYRICA) 75 MG capsule, TAKE 1 CAPSULE IN THE MORNING, 1 CAPSULE IN THE EVENING AND 2 AT BEDTIME, Disp: 120 capsule, Rfl: 5 .  spironolactone (ALDACTONE) 25 MG tablet, TAKE 1 TABLET ONCE DAILY., Disp: 90 tablet, Rfl: 0 Allergies  Allergen Reactions  . Loratadine-Pseudoephedrine Er Hives    Claritin-D   Social History   Tobacco Use  Smoking Status Current Some Day Smoker  . Types: Cigarettes  . Last attempt to quit: 04/18/2013  . Years since quitting: 7.3  Smokeless Tobacco Current User  Tobacco Comment   1-2 cigs in the a.m.   Objective:  There were no vitals filed for this visit. Constitutional Patient is a pleasant 65 y.o. African American female morbidly obese in NAD.Marland Kitchen AAO x 3.  Vascular Capillary refill time to digits immediate b/l.  Palpable pedal pulses b/l LE. Pedal hair sparse. Lower extremity skin temperature gradient within normal limits. Nonpitting edema noted b/l lower extremities. No cyanosis or clubbing noted.  Neurologic Normal speech. Oriented to person, place, and time. Pt has subjective symptoms of neuropathy. Protective sensation intact 5/5 intact bilaterally with 10g monofilament b/l. Vibratory sensation intact b/l. Proprioception intact bilaterally.   Dermatologic Pedal skin with normal turgor, texture and tone bilaterally. No open wounds  bilaterally. Interdigital maceration noted webspace(s) 1-4 b/l. No blistering, no weeping, no open wounds. Toenails 1-5 b/l elongated, discolored, dystrophic, thickened, crumbly with subungual debris and tenderness to dorsal palpation. 0.5 cm flat blood blister medial aspect right 5th digit. No erythema, no edema, no drainage, no fluctuance.Hyperkeratotic lesion submet head 5 b/l. No edema, no erythema, no drainage, no flocculence.  Orthopedic: Normal muscle strength 5/5 to all lower extremity muscle groups bilaterally. No pain crepitus or joint limitation noted with ROM b/l. No gross bony deformities bilaterally.   Assessment:   1. Pain due to onychomycosis of toenails of both feet   2. Tinea pedis of both feet   3. Callus   4. Xerosis cutis   5. Diabetic peripheral neuropathy associated with type 2 diabetes mellitus ([place])    Plan:  Patient was evaluated and treated and all questions answered. Onychomycosis with pain -Nails palliatively debridement as below. -Educated on self-care  Procedure: Nail Debridement Rationale: Pain Type of Debridement: manual, sharp debridement. Instrumentation: Nail nipper, rotary burr. Number of Nails: 10  -Examined patient. -Continue diabetic foot care principles. -Toenails 1-5 b/l were debrided in length and girth with sterile nail nippers and dremel without iatrogenic bleeding.  -Calluses pared submetatarsal head(s) 5 b/l utilizing sterile scalpel blade without incident. -Patient to report any pedal injuries to medical professional immediately. -For dry skin, dispensed list of moisturizers. -Monitor dried blood blister right 5th digit. -Son was instructed to have caregiver apply topical antifungal powder spray betwee her toes once daily. Dry well between toes after baths/showers. -Patient/POA to call should there be question/concern in the interim.  Return in about 3 months (around 11/22/2020).  Marzetta Board, DPM

## 2020-08-29 ENCOUNTER — Inpatient Hospital Stay: Admit: 2020-08-29 | Primary: Registered Nurse

## 2020-08-29 LAB — SENTARA SPECIMEN COLLN.

## 2020-09-03 ENCOUNTER — Other Ambulatory Visit: Payer: Self-pay | Admitting: Internal Medicine

## 2020-09-03 DIAGNOSIS — I5042 Chronic combined systolic (congestive) and diastolic (congestive) heart failure: Secondary | ICD-10-CM

## 2020-09-04 ENCOUNTER — Encounter

## 2020-09-04 ENCOUNTER — Inpatient Hospital Stay: Admit: 2020-09-04 | Payer: PRIVATE HEALTH INSURANCE | Primary: Registered Nurse

## 2020-09-04 DIAGNOSIS — M79601 Pain in right arm: Secondary | ICD-10-CM

## 2020-09-21 ENCOUNTER — Ambulatory Visit
Admit: 2020-09-21 | Discharge: 2020-09-21 | Payer: PRIVATE HEALTH INSURANCE | Attending: Specialist | Primary: Registered Nurse

## 2020-09-21 ENCOUNTER — Ambulatory Visit: Attending: Specialist | Primary: Registered Nurse

## 2020-09-21 DIAGNOSIS — M25561 Pain in right knee: Secondary | ICD-10-CM

## 2020-09-21 DIAGNOSIS — G8929 Other chronic pain: Secondary | ICD-10-CM

## 2020-09-21 MED ORDER — BETAMETHASONE ACET & SOD PHOS 6 MG/ML SUSP FOR INJECTION
6 mg/mL | Freq: Once | INTRAMUSCULAR | Status: AC
Start: 2020-09-21 — End: 2020-09-21
  Administered 2020-09-21: 15:00:00 via INTRA_ARTICULAR
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
## 2020-10-05 ENCOUNTER — Other Ambulatory Visit: Payer: Self-pay | Admitting: Internal Medicine

## 2020-10-05 ENCOUNTER — Other Ambulatory Visit: Payer: Self-pay | Admitting: Neurology

## 2020-10-05 DIAGNOSIS — M4712 Other spondylosis with myelopathy, cervical region: Secondary | ICD-10-CM

## 2020-10-05 DIAGNOSIS — I5042 Chronic combined systolic (congestive) and diastolic (congestive) heart failure: Secondary | ICD-10-CM

## 2020-10-05 DIAGNOSIS — E782 Mixed hyperlipidemia: Secondary | ICD-10-CM

## 2020-10-05 NOTE — Telephone Encounter (Signed)
Per [place] drug registry, last filled on 09/04/2020 Pregabalin 75 Mg Capsule #120.00 for 30 day supply. Will send to Dr Jaynee Eagles. Pt can have PCP send further refills as previously discussed with provider at last office visit.

## 2020-10-11 ENCOUNTER — Inpatient Hospital Stay: Admit: 2020-10-11 | Payer: PRIVATE HEALTH INSURANCE | Primary: Registered Nurse

## 2020-10-11 DIAGNOSIS — M25561 Pain in right knee: Secondary | ICD-10-CM

## 2020-10-11 NOTE — Progress Notes (Signed)
 [facility] - [address]  [address]  [place], [place] [address]  [phone] fax    Plan of Care/ Statement of Necessity for Physical Therapy Services           Patient name: [name] Short Start of Care: 10/11/2020   Referral source: Bertell Broach, MD DOB: 25-Nov-1955    Medical Diagnosis: Right knee pain [M25.561]  Right shoulder pain [M25.511]  Payor: OPTIMA / Plan: [place] [employer] HMO / Product Type: HMO /  Onset Date:chronic with exacerbation 09/21/20    Treatment Diagnosis: right shoulder pain, right knee pain, LBP   Prior Hospitalization: see medical history Provider#: [id number]   Medications: Verified on Patient summary List    Comorbidities: chronicity of condition, HTN, DM, OA, tobacco use, CVA   Prior Level of Function: mod (I) with SPC; 3step to enter 1 story home, 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  Assessment/ key information:   [name] Short is a 65yo female who presents to PT with right knee pain with mobility deficits, right shoulder subacromial pain syndrome, and LBP with generalized pain. Pt demonstrates decrease right shoulder AROM, right knee AROM, and decreased right shoulder and LE strength secondary to pain. Pt reports of deep aching and clunking, yerguson's test was positive yerguson's, long head of biceps tendon tenderness, and positive speeds and O'Brien's tests suggest possible labral involvement. Pt deficits impair her sleep, ability to reach overhead, and perform work duties at [employer]. Pt will benefit from skilled PT in order to address listed deficits, decrease pain, and maximize functional potential; pt would benefit from 2x/week, but financial concerns may limit participation to 1x/week.      Evaluation Complexity History HIGH Complexity :3+ comorbidities / personal factors will impact the outcome/ POC ; Examination HIGH Complexity : 4+ Standardized  tests and measures addressing body structure, function, activity limitation and / or participation in recreation  ;Presentation MEDIUM Complexity : Evolving with changing characteristics  ;Clinical Decision Making MEDIUM Complexity : FOTO score of 26-74  Overall Complexity Rating: MEDIUM  Problem List: pain affecting function, decrease ROM, decrease strength, edema affecting function, impaired gait/ balance, decrease ADL/ functional abilitiies, decrease activity tolerance, decrease flexibility/ joint mobility and decrease transfer abilities   Treatment Plan may include any combination of the following: Therapeutic exercise, Therapeutic activities, Neuromuscular re-education, Physical agent/modality, Gait/balance training, Manual therapy, Aquatic therapy, Patient education, Self Care training, Functional mobility training, Home safety training and Stair training  Patient / Family readiness to learn indicated by: asking questions, trying to perform skills and interest  Persons(s) to be included in education: patient (P)  Barriers to Learning/Limitations: None  Patient Goal (s): "alleviate some of the pain and stiffness I have"  Patient Self Reported Health Status: fair  Rehabilitation Potential: good  Short Term Goals: To be accomplished in 2 weeks:  1.   Pt will report compliance HEP in order to supplement PT treatment   Eval = established  2.   Pt will demonstrate right shoulder AROM flexion to 140degrees in order to improve ability to perform dressing   Eval = 122degrees  3.   Pt will demonstrate right shoulder AROM functional ER to T1 in order to improve ability to bathe    Eval = occiput    Long Term Goals: To be accomplished in 8 treatments:  1.   Pt will  score at least 59 on FOTO in order to improve overall function, decrease pain, and facilitate return to PLOF.   Eval = 52  2.   Pt will demonstrate right gross shoulder strength to at least 5-/5 in order to improve ability to lift objects shoulder height and  perform heavy ADLs   Eval = grossly 4/5  3.   Pt will demonstrate right knee AROM 0-120degrees in order to improve stair negotiation and walking community distances   Eval = 5-95  4.   Pt will demonstrate right knee flex/ext strength and left hip abduction to 5/5 and 4/5, respectively in order to improve walking tolerance   Eval: knee ext: 4/5, flex = 3+/5; abd = 3/5    Frequency / Duration: Patient to be seen 1-2 times per week for 8 treatments.    Patient/ Caregiver education and instruction: Diagnosis, prognosis, self care, activity modification and exercises   [x]   Plan of care has been reviewed with PTA    Laine Piggs, PT 10/11/2020 12:00 PM    ________________________________________________________________________    I certify that the above Therapy Services are being furnished while the patient is under my care. I agree with the treatment plan and certify that this therapy is necessary.    Physician's Signature:____________Date:_________TIME:________     Bertell Broach, MD  ** Signature, Date and Time must be completed for valid certification **    Please sign and return to [facility] - [address]  [address]  [place], [place] [address]  [phone] fax

## 2020-10-11 NOTE — Progress Notes (Signed)
PT DAILY TREATMENT NOTE 11-20    Patient Name: [name] Short  Date:10/11/2020  DOB: [birth year]  [x]   Patient DOB Verified  Payor: OPTIMA / Plan: [place] OPTIMA HMO / Product Type: HMO /    In time:1210  Out time:1251  Total Treatment Time (min): 41  Visit #: 1 of 8    Treatment Area: Right knee pain [M25.561]  Right shoulder pain [M25.511]    SUBJECTIVE  Pain Level (0-10 scale): 5  Any medication changes, allergies to medications, adverse drug reactions, diagnosis change, or new procedure performed?: [x]  No    []  Yes (see summary sheet for update)  Subjective functional status/changes:   []  No changes reported  See evaluation    OBJECTIVE    41 min [x] Eval                  [] Re-Eval             With   []  TE   []  TA   []  neuro   []  other: Patient Education: [x]  Review HEP    []  Progressed/Changed HEP based on:   []  positioning   []  body mechanics   []  transfers   []  heat/ice application    []  other:      Other Objective/Functional Measures: see evaluation     Pain Level (0-10 scale) post treatment: 4    ASSESSMENT/Changes in Function: see POC    Patient will continue to benefit from skilled PT services to modify and progress therapeutic interventions, address functional mobility deficits, address ROM deficits, address strength deficits, analyze and address soft tissue restrictions, analyze and cue movement patterns, analyze and modify body mechanics/ergonomics, assess and modify postural abnormalities, address imbalance/dizziness and instruct in home and community integration to attain remaining goals.     [x]   See Plan of Care  []   See progress note/recertification  []   See Discharge Summary         Progress towards goals / Updated goals:  Short Term Goals: To be accomplished in 2 weeks:  1.   Pt will report compliance HEP in order to supplement PT treatment   Eval = established  2.   Pt will demonstrate right shoulder AROM flexion to 140degrees in order to improve ability to perform dressing   Eval = 122degrees  3.   Pt  will demonstrate right shoulder AROM functional ER to T1 in order to improve ability to bathe    Eval = occiput    Long Term Goals: To be accomplished in 8 treatments:  1.   Pt will score at least 59 on FOTO in order to improve overall function, decrease pain, and facilitate return to PLOF.   Eval = 52  2.   Pt will demonstrate right gross shoulder strength to at least 5-/5 in order to improve ability to lift objects shoulder height and perform heavy ADLs   Eval = grossly 4/5  3.   Pt will demonstrate right knee AROM 0-120degrees in order to improve stair negotiation and walking community distances   Eval = 5-95  4.   Pt will demonstrate right knee flex/ext strength and left hip abduction to 5/5 and 4/5, respectively in order to improve walking tolerance   Eval: knee ext: 4/5, flex = 3+/5; abd = 3/5    PLAN  [x]   Upgrade activities as tolerated     [x]   Continue plan of care  []   Update interventions per flow sheet       []   Discharge due to:_  []   Other:_      Precious Bard, PT 10/11/2020  12:01 PM    No future appointments.

## 2020-10-19 ENCOUNTER — Encounter: Payer: PRIVATE HEALTH INSURANCE | Primary: Registered Nurse

## 2020-10-26 ENCOUNTER — Encounter

## 2020-10-29 ENCOUNTER — Telehealth: Payer: Self-pay

## 2020-11-02 ENCOUNTER — Encounter

## 2020-11-05 ENCOUNTER — Other Ambulatory Visit: Payer: Self-pay

## 2020-11-05 ENCOUNTER — Encounter: Payer: Self-pay | Admitting: Internal Medicine

## 2020-11-05 ENCOUNTER — Ambulatory Visit (INDEPENDENT_AMBULATORY_CARE_PROVIDER_SITE_OTHER): Payer: Medicare Other | Admitting: Internal Medicine

## 2020-11-05 VITALS — BP 123/107 | HR 73 | Temp 98.3°F | Ht 63.0 in | Wt 239.5 lb

## 2020-11-05 DIAGNOSIS — I5042 Chronic combined systolic (congestive) and diastolic (congestive) heart failure: Secondary | ICD-10-CM | POA: Diagnosis not present

## 2020-11-05 DIAGNOSIS — R3981 Functional urinary incontinence: Secondary | ICD-10-CM

## 2020-11-05 DIAGNOSIS — Z720 Tobacco use: Secondary | ICD-10-CM | POA: Diagnosis not present

## 2020-11-05 DIAGNOSIS — Z8639 Personal history of other endocrine, nutritional and metabolic disease: Secondary | ICD-10-CM | POA: Diagnosis not present

## 2020-11-05 DIAGNOSIS — E782 Mixed hyperlipidemia: Secondary | ICD-10-CM

## 2020-11-05 LAB — POCT GLYCOSYLATED HEMOGLOBIN (HGB A1C): HbA1c POC (<> result, manual entry): >14 % — AB (ref 4.0–5.6)

## 2020-11-05 LAB — GLUCOSE, CAPILLARY: Glucose-Capillary: 346 mg/dL — ABNORMAL HIGH (ref 70–99)

## 2020-11-05 MED ORDER — BUPROPION HCL ER (SR) 150 MG PO TB12: 150.0000 mg | ORAL_TABLET | Freq: Two times a day (BID) | ORAL | 2 refills | Status: DC

## 2020-11-05 MED ORDER — POTASSIUM CHLORIDE CRYS ER 20 MEQ PO TBCR: 20.0000 meq | EXTENDED_RELEASE_TABLET | Freq: Every day | ORAL | 3 refills | Status: DC

## 2020-11-05 MED ORDER — METOPROLOL SUCCINATE ER 50 MG PO TB24: ORAL_TABLET | ORAL | 3 refills | Status: DC

## 2020-11-05 MED ORDER — ASPIRIN 81 MG PO TBEC: 81.0000 mg | DELAYED_RELEASE_TABLET | Freq: Every day | ORAL | 3 refills | Status: DC

## 2020-11-05 MED ORDER — SPIRONOLACTONE 25 MG PO TABS: 1.0000 | ORAL_TABLET | Freq: Every day | ORAL | 3 refills | Status: DC

## 2020-11-05 MED ORDER — ATORVASTATIN CALCIUM 40 MG PO TABS: 1.0000 | ORAL_TABLET | Freq: Every day | ORAL | 3 refills | Status: DC

## 2020-11-05 NOTE — Assessment & Plan Note (Addendum)
Has previously history of diabetes that resolved with weight loss. Last hgb a1c 5.2 on 2019. Recent BMPs show elevated random blood glucose >300. Will check hgb a1c this visit.  - Check hgb a1c - C/w encourage weight loss  Addendum: Hgb a1c >14. Need to start therapy. Discussed with Ms.Struve regarding this update and recommended starting multi-drug regimen for her diabetes based on severity. Ms.Zagal expressed understanding and mentions that she wishes to avoid injectables if possible. Advised we can start Synjardy with her hx of heart failure as well. Ms.Bohac expressed understanding.  - Start empagliflozin/metformin 5-500mg  BID

## 2020-11-05 NOTE — Assessment & Plan Note (Signed)
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 Her weight has remained stable and her hypokalemia associated with her diuretic use has resolved last clinic visit. She mentions taking her medications as prescribed with no significant side effects. Denies significant orthopnea, chest pain, palpitations, dyspnea.  - Bmp - C/w furosemide 40mg  BID, spironolactone 25mg  daily, metoprolol 50mg  daily, enalapril 5mg  BID

## 2020-11-05 NOTE — Progress Notes (Signed)
CC: Urine leakage  HPI: [name] is a 65 y.o. with PMH listed below presenting with complaint of urinary incontinence. Please see problem based assessment and plan for further details.  Past Medical History:  Diagnosis Date  . CHF (congestive heart failure) ([place])   . Chronic headaches   . DJD (degenerative joint disease)   . DM (diabetes mellitus) ([place]) 2008   stable HgBA1C at 6.5  . GERD (gastroesophageal reflux disease)    well controlled on Omeprazole  . HLD (hyperlipidemia) 2008   stable, well controlled  . Hypertension    well controlled    Review of Systems: Review of Systems  Constitutional: Negative for chills, fever and malaise/fatigue.  Eyes: Negative for blurred vision.  Cardiovascular: Negative for chest pain, palpitations and leg swelling.  Gastrointestinal: Negative for diarrhea, nausea and vomiting.  Genitourinary: Negative for dysuria, frequency and urgency.  Musculoskeletal: Negative for myalgias.  Neurological: Negative for dizziness, sensory change and headaches.  All other systems reviewed and are negative.    Physical Exam: Vitals:   11/05/20 1604  BP: (!) 123/107  Pulse: 73  Temp: 98.3 F (36.8 C)  TempSrc: Oral  SpO2: 100%  Weight: 239 lb 8 oz (108.6 kg)  Height: 5\' 3"  (1.6 m)   Gen: Well-developed, well nourished, NAD HEENT: NCAT head, hearing intact CV: RRR, S1, S2 normal Pulm: CTAB, No rales, no wheezes Extm: Stable fixed contracture of LUE,  2+ pitting edema up to ankles Skin: Dry, Warm, normal turgor  Assessment & Plan:   History of diabetes mellitus, type II Has previously history of diabetes that resolved with weight loss. Last hgb a1c 5.2 on 2019. Recent BMPs show elevated random blood glucose >300. Will check hgb a1c this visit.  - Check hgb a1c - C/w encourage weight loss  Tobacco abuse Continues to smoke cigarettes. Down to 3 daily from 6 cigarettes last year. Discussed benefits of tobacco cessation and risks  associated with continued use. Currently on wellbutrin with improvement in her cravings.   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 Her weight has remained stable and her hypokalemia associated with her diuretic use has resolved last clinic visit. She mentions taking her medications as prescribed with no significant side effects. Denies significant orthopnea, chest pain, palpitations, dyspnea.  - Bmp - C/w furosemide 40mg  BID, spironolactone 25mg  daily, metoprolol 50mg  daily, enalapril 5mg  BID  Functional urinary incontinence Presents with complaint of urinary incontinence. She states she has episodes where she is unable to get to the bathroom on time when she feels the need to urinate. She mentions making large volume urine every 2-3 times a day. She notices that she has some leakage with large movements, especially when she gets up from a sitting position. She states she has been using incontinence pads and requests RX for assistance with cost.  A/P Presenting with urinary continence. Appear to be primarily functional incontinence with difficulty with mobility in the bathroom due to spastic quadriparesis although neurogenic and stress incontinence may be contributing. - Discussed use of  timed toileting and kegal exercises - Advised to f/u with urology if sxs worsen - Incontinence supply RX provided  Hyperlipidemia Pt requires refills on medications with associated diagnosis above.  Reviewed disease process and find this medication to be  necessary, will not change dose or alter current therapy.     Patient discussed with Dr. Jimmye Norman  -Gilberto Better, [facility] Pager: [phone]

## 2020-11-05 NOTE — Assessment & Plan Note (Signed)
Presents with complaint of urinary incontinence. She states she has episodes where she is unable to get to the bathroom on time when she feels the need to urinate. She mentions making large volume urine every 2-3 times a day. She notices that she has some leakage with large movements, especially when she gets up from a sitting position. She states she has been using incontinence pads and requests RX for assistance with cost.  A/P Presenting with urinary continence. Appear to be primarily functional incontinence with difficulty with mobility in the bathroom due to spastic quadriparesis although neurogenic and stress incontinence may be contributing. - Discussed use of timed toileting and kegal exercises - Advised to f/u with urology if sxs worsen - Incontinence supply RX provided

## 2020-11-05 NOTE — Patient Instructions (Addendum)
Thank you for allowing [place] to provide your care today. Today we discussed your urinary incontinence    I have ordered bmp, hgb a1c labs for you. I will call if any are abnormal.    Today we made no changes to your medications.    Please follow-up in 3 months.    Should you have any questions or concerns please call the internal medicine clinic at [phone].     Heart Failure Eating Plan Heart failure, also called congestive heart failure, occurs when your heart does not pump blood well enough to meet your body's needs for oxygen-rich blood. Heart failure is a long-term (chronic) condition. Living with heart failure can be challenging. Following your health care provider's instructions about a healthy lifestyle and working with a dietitian to choose the right foods may help to improve your symptoms. An eating plan for someone with heart failure will include changes that limit the intake of salt (sodium) and unhealthy fat. What are tips for following this plan? Reading food labels  Check food labels for the amount of sodium per serving. Choose foods that have less than 140 mg (milligrams) of sodium in each serving.  Check food labels for the number of calories per serving. This is important if you need to limit your daily calorie intake to lose weight.  Check food labels for the serving size. If you eat more than one serving, you will be eating more sodium and calories than what is listed on the label.  Look for foods that are labeled as "sodium-free," "very low sodium," or "low sodium." ? Foods labeled as "reduced sodium" or "lightly salted" may still have more sodium than what is recommended for you. Cooking  Avoid adding salt when cooking. Ask your health care provider or dietitian before using salt substitutes.  Season food with salt-free seasonings, spices, or herbs. Check the label of seasoning mixes to make sure they do not contain salt.  Cook with heart-healthy oils, such as  olive, canola, soybean, or sunflower oil.  Do not fry foods. Cook foods using low-fat methods, such as baking, boiling, grilling, and broiling.  Limit unhealthy fats when cooking by: ? Removing the skin from poultry, such as chicken. ? Removing all visible fats from meats. ? Skimming the fat off from stews, soups, and gravies before serving them. Meal planning  Limit your intake of: ? Processed, canned, or prepackaged foods. ? Foods that are high in trans fat, such as fried foods. ? Sweets, desserts, sugary drinks, and other foods with added sugar. ? Full-fat dairy products, such as whole milk.  Eat a balanced diet. This may include: ? 4-5 servings of fruit each day and 4-5 servings of vegetables each day. At each meal, try to fill one-half of your plate with fruits and vegetables. ? Up to 6-8 servings of whole grains each day. ? Up to 2 servings of lean meat, poultry, or fish each day. One serving of meat is equal to 3 oz (85 g). This is about the same size as a deck of cards. ? 2 servings of low-fat dairy each day. ? Heart-healthy fats. Healthy fats called omega-3 fatty acids are found in foods such as flaxseed and cold-water fish like sardines, salmon, and mackerel.  Aim to eat 25-35 g (grams) of fiber a day. Foods that are high in fiber include apples, broccoli, carrots, beans, peas, and whole grains.  Do not add salt or condiments that contain salt (such as soy sauce) to foods before  eating.  When eating at a restaurant, ask that your food be prepared with less salt or no salt, if possible.  Try to eat 2 or more vegetarian meals each week.  Eat more home-cooked food and eat less restaurant, buffet, and fast food.   General information  Do not eat more than 2,300 mg of sodium a day. The amount of sodium that is recommended for you may be lower, depending on your condition.  Maintain a healthy body weight as directed. Ask your health care provider what a healthy weight is for  you. ? Check your weight every day. ? Work with your health care provider and dietitian to make a plan that is right for you to lose weight or maintain your current weight.  Limit how much fluid you drink. Ask your health care provider or dietitian how much fluid you can have each day.  Limit or avoid alcohol as told by your health care provider or dietitian. Recommended foods Fruits All fresh, frozen, and canned fruits. Dried fruits, such as raisins, prunes, and cranberries. Vegetables All fresh vegetables. Vegetables that are frozen without sauce or added salt. Low-sodium or sodium-free canned vegetables. Grains Bread with less than 80 mg of sodium per slice. Whole-wheat pasta, quinoa, and brown rice. Oats and oatmeal. Barley. [place]. Grits and cream of wheat. Whole-grain and whole-wheat cold cereal. Meats and other protein foods Lean cuts of meat. Skinless chicken and [place]. Fish with high omega-3 fatty acids, such as salmon, sardines, and other cold-water fishes. Eggs. Dried beans, peas, and edamame. Unsalted nuts and nut butters. Dairy Low-fat or nonfat (skim) milk and dried milk. Rice milk, soy milk, and almond milk. Low-fat or nonfat yogurt. Small amounts of reduced-sodium block cheese. Low-sodium cottage cheese. Fats and oils Olive, canola, soybean, flaxseed, avocado, or sunflower oil. Sweets and desserts Applesauce. Granola bars. Sugar-free pudding and gelatin. Frozen fruit bars. Seasoning and other foods Fresh and dried herbs. Lemon or lime juice. Vinegar. Low-sodium ketchup. Salt-free marinades, salad dressings, sauces, and seasonings. The items listed above may not be a complete list of foods and beverages you can eat. Contact a dietitian for more information. Foods to avoid Fruits Fruits that are dried with sodium-containing preservatives. Vegetables Canned vegetables. Frozen vegetables with sauce or seasonings. Creamed vegetables. [place] fries. Onion rings. Pickled  vegetables and sauerkraut. Grains Bread with more than 80 mg of sodium per slice. Hot or cold cereal with more than 140 mg sodium per serving. Salted pretzels and crackers. Prepackaged breadcrumbs. Bagels, croissants, and biscuits. Meats and other protein foods Ribs and chicken wings. Bacon, ham, pepperoni, bologna, salami, and packaged luncheon meats. Hot dogs, bratwurst, and sausage. Canned meat. Smoked meat and fish. Salted nuts and seeds. Dairy Whole milk, half-and-half, and cream. Buttermilk. Processed cheese, cheese spreads, and cheese curds. Regular cottage cheese. Feta cheese. Shredded cheese. String cheese. Fats and oils Butter, lard, shortening, ghee, and bacon fat. Canned and packaged gravies. Seasoning and other foods Onion salt, garlic salt, table salt, and sea salt. Marinades. Regular salad dressings. Relishes, pickles, and olives. Meat flavorings and tenderizers, and bouillon cubes. Horseradish, ketchup, and mustard. Worcestershire sauce. Teriyaki sauce, soy sauce (including reduced sodium). Hot sauce and Tabasco sauce. Steak sauce, fish sauce, oyster sauce, and cocktail sauce. Taco seasonings. Barbecue sauce. Tartar sauce. The items listed above may not be a complete list of foods and beverages you should avoid. Contact a dietitian for more information. Summary  A heart failure eating plan includes changes that limit your intake  of sodium and unhealthy fat, and it may help you lose weight or maintain a healthy weight. Your health care provider may also recommend limiting how much fluid you drink.  Most people with heart failure should eat no more than 2,300 mg of salt (sodium) a day. The amount of sodium that is recommended for you may be lower, depending on your condition.  Contact your health care provider or dietitian before making any major changes to your diet. This information is not intended to replace advice given to you by your health care provider. Make sure you discuss  any questions you have with your health care provider. Document Revised: 03/05/2020 Document Reviewed: 03/05/2020 Elsevier Patient Education  2021 [employer].

## 2020-11-05 NOTE — Assessment & Plan Note (Signed)
Pt requires refills on medications with associated diagnosis above.  Reviewed disease process and find this medication to be necessary, will not change dose or alter current therapy. 

## 2020-11-05 NOTE — Assessment & Plan Note (Signed)
Continues to smoke cigarettes. Down to 3 daily from 6 cigarettes last year. Discussed benefits of tobacco cessation and risks associated with continued use. Currently on wellbutrin with improvement in her cravings.   - C/w bupropion 150mg  BID - C/w tobacco cessation counseling

## 2020-11-06 LAB — BMP8+ANION GAP
Anion Gap: 16 mmol/L (ref 10.0–18.0)
BUN/Creatinine Ratio: 11 — ABNORMAL LOW (ref 12–28)
BUN: 8 mg/dL (ref 8–27)
CO2: 20 mmol/L (ref 20–29)
Calcium: 8.7 mg/dL (ref 8.7–10.3)
Chloride: 101 mmol/L (ref 96–106)
Creatinine, Ser: 0.75 mg/dL (ref 0.57–1.00)
Glucose: 381 mg/dL — ABNORMAL HIGH (ref 65–99)
Potassium: 3.7 mmol/L (ref 3.5–5.2)
Sodium: 137 mmol/L (ref 134–144)
eGFR: 90 mL/min/{1.73_m2} (ref >59)

## 2020-11-06 MED ORDER — EMPAGLIFLOZIN-METFORMIN HCL 5-500 MG PO TABS: 5.0000 mg | ORAL_TABLET | Freq: Two times a day (BID) | ORAL | 3 refills | Status: DC

## 2020-11-06 NOTE — Addendum Note (Signed)
Addended by: Mosetta Anis on: 11/06/2020 04:23 PM   Modules accepted: Orders

## 2020-11-07 ENCOUNTER — Encounter

## 2020-11-07 ENCOUNTER — Inpatient Hospital Stay: Admit: 2020-11-07 | Payer: PRIVATE HEALTH INSURANCE | Attending: Vascular Surgery | Primary: Registered Nurse

## 2020-11-07 ENCOUNTER — Encounter: Payer: Self-pay | Admitting: Internal Medicine

## 2020-11-07 DIAGNOSIS — I890 Lymphedema, not elsewhere classified: Secondary | ICD-10-CM

## 2020-11-07 LAB — CREATININE, POC
Creatinine, POC: 0.9 MG/DL (ref 0.6–1.3)
GFRAA, POC: >60 mL/min/{1.73_m2} (ref >60)
GFRNA, POC: >60 mL/min/{1.73_m2} (ref >60)

## 2020-11-07 LAB — AMB POC CREATININE
Creatinine, POC: 0.9 mg/dL (ref 0.6–1.3)
GFR African American: >60 mL/min/{1.73_m2} (ref >60)
GFR Non-African American: >60 mL/min/{1.73_m2} (ref >60)

## 2020-11-07 MED ORDER — IOPAMIDOL 76 % IV SOLN
76 % | Freq: Once | INTRAVENOUS | Status: AC
Start: 2020-11-07 — End: 2020-11-07
  Administered 2020-11-07: 20:00:00 via INTRAVENOUS

## 2020-11-07 MED FILL — ISOVUE-370  76 % INTRAVENOUS SOLUTION: 370 mg iodine /mL (76 %) | INTRAVENOUS | Qty: 100

## 2020-11-08 ENCOUNTER — Inpatient Hospital Stay: Admit: 2020-11-08 | Payer: PRIVATE HEALTH INSURANCE | Primary: Registered Nurse

## 2020-11-08 DIAGNOSIS — M25561 Pain in right knee: Secondary | ICD-10-CM

## 2020-11-08 NOTE — Progress Notes (Signed)
 PT DAILY TREATMENT NOTE 06-21    Patient Name: [name] Short  Date:11/08/2020  DOB: 05-Jul-1956  [x]   Patient DOB Verified  Payor: OPTIMA / Plan: [place] [employer] H[place] / Product Type: HMO /    In time:225  Out time:308  Total Treatment Time (min): 43  Visit #: 2 of 8      Treatment Area: Right knee pain [M25.561]  Right shoulder pain [M25.511]    SUBJECTIVE  Pain Level (0-10 scale): knees- 0, LBP- 9, shoulder- 6  Any medication changes, allergies to medications, adverse drug reactions, diagnosis change, or new procedure performed?: [x]  No    []  Yes (see summary sheet for update)  Subjective functional status/changes:   []  No changes reported  Patient reports it's her shoulder and her back that are hurting today.    OBJECTIVE    Modality rationale: decrease pain and increase tissue extensibility to improve the patient's ability to perform ADLs   Min Type Additional Details    []  Estim:  [] Unatt       [] IFC  [] Premod                        [] Other:  [] w/ice   [] w/heat  Position:  Location:    []  Estim: [] Att    [] TENS instruct  [] NMES                    [] Other:  [] w/US    [] w/ice   [] w/heat  Position:  Location:    []   Traction: []  Cervical       [] Lumbar                       []  Prone          [] Supine                       [] Intermittent   [] Continuous Lbs:  []  before manual  []  after manual    []   Ultrasound: [] Continuous   []  Pulsed                           []   [] W/cm2:  Location:    []   Iontophoresis with dexamethasone          Location: []  Take home patch   []  In clinic   10 []   Ice     [x]   heat  []   Ice massage  []   Laser   []   Anodyne Position:  Supine with wedge  Location: low back    []   Laser with stim  []   Other:  Position:  Location:    []   Vasopneumatic Device    []   Right     []   Left  Pre-treatment girth:  Post-treatment girth:  Measured at (location):  Pressure:       []  lo []  med []  hi   Temperature: []  lo []  med []  hi   [x]  Skin assessment post-treatment:  [x] intact [] redness- no adverse reaction     [] redness - adverse reaction:     10 min Therapeutic Exercise:  [x]  See flow sheet :   Rationale: increase ROM and increase strength to improve the patient's ability to increase activity tolerance    23 min Therapeutic Activity:  [x]   See flow sheet : FOTO, goals assessment   Rationale: increase ROM, increase strength and improve coordination  to  improve the patient's ability to continue to progress in therapy     With   []  TE   []  TA   []  neuro   []  other: Patient Education: [x]  Review HEP    []  Progressed/Changed HEP based on:   []  positioning   []  body mechanics   []  transfers   []  heat/ice application    []  other:      Other Objective/Functional Measures:   Functional Gains: none reported  Functional Deficits: back pain, shoulder pain, knee pain, left knee buckling  % improvement: 0%  Pain   Average: 8/10       Best: 0/10     Worst: 10/10  Patient Goal: "I don't want too much pain, be at least 1 or 2"     Pain Level (0-10 scale) post treatment: 0    ASSESSMENT/Changes in Function: Patient is due for reassessment at first f/u due to conflicts with her work schedule. She reports noncompliance with HEP and no functional gains since evaluation. She has made no significant progress with strength or ROM as she has not yet been seen for therapy and has not been performing exercises at home. Educated patient on the importance of HEP performance to improve strength and ROM to supplement gains made in therapy. She will benefit from continued therapy to reduce pain and improve strength and ROM.    Patient will continue to benefit from skilled PT services to modify and progress therapeutic interventions, address functional mobility deficits, address ROM deficits, address strength deficits, analyze and address soft tissue restrictions, analyze and cue movement patterns, analyze and modify body mechanics/ergonomics, assess and modify postural abnormalities, address imbalance/dizziness and instruct in home and community  integration to attain remaining goals.     []   See Plan of Care  []   See progress note/recertification  []   See Discharge Summary         Progress towards goals / Updated goals:  Short Term Goals: To be accomplished in 2 weeks:  1.   Pt will report compliance HEP in order to supplement PT treatment               Eval = established   Reports noncompliance  2.   Pt will demonstrate right shoulder AROM flexion to 140degrees in order to improve ability to perform dressing               Eval = 122degrees   AROM 122 deg  3.   Pt will demonstrate right shoulder AROM functional ER to T1 in order to improve ability to bathe                Eval = occiput   No change  Long Term Goals: To be accomplished in 8 treatments:  1.   Pt will score at least 59 on FOTO in order to improve overall function, decrease pain, and facilitate return to PLOF.               Eval = 52   MET: 69  2.   Pt will demonstrate right gross shoulder strength to at least 5-/5 in order to improve ability to lift objects shoulder height and perform heavy ADLs               Eval = grossly 4/5   Grossly: 4/5  3.   Pt will demonstrate right knee AROM 0-120degrees in order to improve stair negotiation and walking community distances  Eval = 5-95   Right knee AROM 3-100 deg  4.   Pt will demonstrate right knee flex/ext strength and left hip abduction to 5/5 and 4/5, respectively in order to improve walking tolerance               Eval: knee ext: 4/5, flex = 3+/5; abd = 3/5   Knee ext = 4/5, flex = 3+/5, Hip abd = 3-/5    PLAN  [x]   Upgrade activities as tolerated     [x]   Continue plan of care  []   Update interventions per flow sheet       []   Discharge due to:_  []   Other:_      Maxie Spaniel, PTA 11/08/2020  1:52 PM    Future Appointments   Date Time Provider Department Center   11/08/2020  2:15 PM Elinore Guarneri [facility]   11/13/2020 10:30 AM [facility] 2 [facility]   11/13/2020 12:30 PM [facility] 1 [facility]   11/22/2020  3:00 PM Lake Pilgrim, MD [facility]

## 2020-11-08 NOTE — Progress Notes (Signed)
 [facility] - [address]  [address]  [place], [place] [address]  [phone] fax    Progress Note                                                                     Patient name: [name] Short Start of Care: 10/11/2020   Referral source: Tonia Brooms, MD DOB: [birth year]                Medical Diagnosis: Right knee pain [M25.561]  Right shoulder pain [M25.511]  Payor: OPTIMA / Plan: [place] [employer] H[employer] / Product Type: HMO /  Onset Date:chronic with exacerbation 09/21/20                Treatment Diagnosis: right shoulder pain, right knee pain, LBP   Prior Hospitalization: see medical history Provider#: [id number]   Medications: Verified on Patient summary List    Comorbidities: chronicity of condition, HTN, DM, OA, tobacco use, CVA   Prior Level of Function: mod (I) with SPC; 3step to enter 1 story home, 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 Pt will reportcompliance HEP in order to supplement PT treatment  Eval = established              Reports noncompliance  2. Pt will demonstrate rightshoulder AROM flexion to 140degreesin order to improve ability to perform dressing  Eval = 122degrees              AROM 122 deg  3. Pt will demonstrate rightshoulder AROM functional ER to T1in order to improve ability to bathe   Eval = occiput             No change    Long Term Goals:To be accomplished in 8treatments:  1. Pt will score at least 59on FOTO in order to improve overall function, decrease pain, and facilitate return to PLOF.  Eval = 52              MET: 69  2. Pt will demonstrate right grossshoulder strength to at least 5-/5 in order to improve ability to lift objects shoulder height and perform heavy ADLs  Eval = grossly 4/5              Grossly: 4/5  3. Pt will  demonstrate right knee AROM 0-120degrees in order to improve stair negotiation and walking community distances  Eval = 5-95              Right knee AROM 3-100 deg  4. Pt will demonstrate right knee flex/ext strength and left hip abduction to 5/5 and 4/5, respectively in order to improve walking tolerance  Eval: knee ext: 4/5, flex = 3+/5; abd = 3/5              Knee ext = 4/5, flex = 3+/5, Hip abd = 3-/5    Key Functional Changes:   Functional Gains: none reported  Functional Deficits: back pain, shoulder pain, knee pain, left knee buckling  % improvement:  0%  Pain   Average: 8/10                  Best: 0/10                Worst: 10/10  Patient Goal: "I don't want too much pain, be at least 1 or 2"    Updated Goals: to be achieved in 4 weeks:  1. Pt will demonstrate rightshoulder AROM flexion to 140degreesin order to improve ability to perform dressing  PN status: AROM 122 deg  2. Pt will demonstrate rightshoulder AROM functional ER to T1in order to improve ability to bathe   PN status: no change = occiput  3. Pt will demonstrate right grossshoulder strength to at least 5-/5 in order to improve ability to lift objects shoulder height and perform heavy ADLs  PN status: no change = Grossly: 4/5  4. Pt will demonstrate right knee AROM 0-120degrees in order to improve stair negotiation and walking community distances  PN status: Right knee AROM 3-100 deg  5. Pt will demonstrate right knee flex/ext strength and left hip abduction to 5/5 and 4/5, respectively in order to improve walking tolerance  PN status: Knee ext = 4/5, flex = 3+/5, Hip abd = 3-/5      ASSESSMENT/RECOMMENDATIONS: Patient is due for reassessment at first f/u due to conflicts with her work schedule. She reports noncompliance with HEP and no functional gains since evaluation. She has made no significant progress with strength or ROM as she has not  yet been seen for therapy and has not been performing exercises at home. Educated patient on the importance of HEP performance to improve strength and ROM to supplement gains made in therapy. She will benefit from continued therapy to reduce pain and improve strength and ROM.    [x] Continue therapy per initial plan/protocol at a frequency of  2 x per week for 4 weeks  [] Continue therapy with the following recommended changes:_____________________      _____________________________________________________________________  [] Discontinue therapy progressing towards or have reached established goals  [] Discontinue therapy due to lack of appreciable progress towards goals  [] Discontinue therapy due to lack of attendance or compliance  [] Await Physician's recommendations/decisions regarding therapy  [] Other:________________________________________________________________    Thank you for this referral.    Precious Bard, PT 11/09/2020 10:28 AM  NOTE TO PHYSICIAN:  PLEASE COMPLETE THE ORDERS BELOW AND   FAX TO [facility]: [phone]  If you are unable to process this request in 24 hours please contact our office: [phone]    []   I have read the above report and request that my patient continue as recommended.  []   I have read the above report and request that my patient continue therapy with the following changes/special instructions:________________________________________  []  I have read the above report and request that my patient be discharged from therapy.    Physician's Signature:____________Date:_________TIME:________     Tonia Brooms, MD  ** Signature, Date and Time must be completed for valid certification **

## 2020-11-08 NOTE — Progress Notes (Signed)
Internal Medicine Clinic Attending  Case discussed with Dr. Lee  At the time of the visit.  We reviewed the resident's history and exam and pertinent patient test results.  I agree with the assessment, diagnosis, and plan of care documented in the resident's note.    

## 2020-11-13 ENCOUNTER — Inpatient Hospital Stay: Admit: 2020-11-13 | Payer: PRIVATE HEALTH INSURANCE | Attending: Family | Primary: Registered Nurse

## 2020-11-13 DIAGNOSIS — E213 Hyperparathyroidism, unspecified: Secondary | ICD-10-CM

## 2020-11-13 MED ORDER — TECHNETIUM TC-99M SESTAMIBI (CARDIOLITE) INJECTION WITH DILUTION KIT
Freq: Once | INTRAVENOUS | Status: AC
Start: 2020-11-13 — End: 2020-11-13
  Administered 2020-11-13: 18:00:00 via INTRAVENOUS

## 2020-11-15 ENCOUNTER — Inpatient Hospital Stay: Admit: 2020-11-15 | Payer: PRIVATE HEALTH INSURANCE | Primary: Registered Nurse

## 2020-11-15 NOTE — Progress Notes (Signed)
 PT DAILY TREATMENT NOTE 06-21    Patient Name: [name] Short  Date:11/15/2020  DOB: Jun 03, 1956  [x]   Patient DOB Verified  Payor: OPTIMA / Plan: [place] [employer] HMO / Product Type: HMO /    In time:225  Out time:303  Total Treatment Time (min): 38  Visit #: 1 of 8    Treatment Area: Right knee pain [M25.561]  Right shoulder pain [M25.511]    SUBJECTIVE  Pain Level (0-10 scale): 8-back, 0-shoulder, 0-knee  Any medication changes, allergies to medications, adverse drug reactions, diagnosis change, or new procedure performed?: [x]  No    []  Yes (see summary sheet for update)  Subjective functional status/changes:   []  No changes reported  Pt states back is bothering her more than her shoulder and knee    OBJECTIVE    25 min Therapeutic Exercise:  [x]  See flow sheet :   Rationale: increase ROM and increase strength to improve the patient's ability to increase activity tolerance     13 min Neuromuscular Re-education:  [x]   See flow sheet :   Rationale: increase ROM, increase strength, improve coordination, improve balance and increase proprioception  to improve the patient's ability to ambulate in community safely         With   []  TE   []  TA   []  neuro   []  other: Patient Education: [x]  Review HEP    []  Progressed/Changed HEP based on:   []  positioning   []  body mechanics   []  transfers   []  heat/ice application    []  other:      Other Objective/Functional Measures:   Increased right shoulder pain post treatment session  Unable to assess knee AROM d/t patient wearing skirt     Pain Level (0-10 scale) post treatment: 8-back, 8-shoulder, 0-knee    ASSESSMENT/Changes in Function: Pt making slow progress towards goals. Initiated POC as per flowsheet. Pt tolerated treatment session well. Pt continues to ambulate with SPC.    Patient will continue to benefit from skilled PT services to modify and progress therapeutic interventions, address functional mobility deficits, address ROM deficits, address strength deficits, analyze and address  soft tissue restrictions, analyze and cue movement patterns, analyze and modify body mechanics/ergonomics, assess and modify postural abnormalities, address imbalance/dizziness and instruct in home and community integration to attain remaining goals.     []   See Plan of Care  []   See progress note/recertification  []   See Discharge Summary         Progress towards goals / Updated goals:  1. Pt will demonstrate rightshoulder AROM flexion to 140degreesin order to improve ability to perform dressing  PN status: [id number]  2. Pt will demonstrate rightshoulder AROM functional ER to T1in order to improve ability to bathe   PN status: no change = occiput  3. Pt will demonstrate right grossshoulder strength to at least 5-/5 in order to improve ability to lift objects shoulder height and perform heavy ADLs  PN status: no change = Grossly:4/5  4. Pt will demonstrate right knee AROM 0-120degrees in order to improve stair negotiation and walking community distances  PN status: Right knee AROM3-100deg  5. Pt will demonstrate right knee flex/ext strength and left hip abduction to 5/5 and 4/5, respectively in order to improve walking tolerance  PN status: Knee ext =4/5, flex =3+/5, Hip abd =3-/5    PLAN  []   Upgrade activities as tolerated     [x]   Continue plan of care  []   Update interventions  per flow sheet       []   Discharge due to:_  []   Other:_      Houston Mace, PTA 11/15/2020  10:48 AM    Future Appointments   Date Time Provider Department Center   11/15/2020  2:15 PM Antoinette Batman [facility]   11/19/2020  2:15 PM Recel, Remigio Carl [facility]   11/22/2020  3:00 PM [place] Pil[place], MD [facility]

## 2020-11-19 ENCOUNTER — Inpatient Hospital Stay: Admit: 2020-11-19 | Payer: PRIVATE HEALTH INSURANCE | Primary: Registered Nurse

## 2020-11-19 ENCOUNTER — Telehealth: Payer: Self-pay

## 2020-11-19 NOTE — Progress Notes (Signed)
 PT DAILY TREATMENT NOTE 06-21    Patient Name: [name] Short  Date:11/19/2020  DOB: 1955/12/31  [x]   Patient DOB Verified  Payor: OPTIMA / Plan: [place] [employer] HMO / Product Type: HMO /    In time:217  Out time:255  Total Treatment Time (min): 38  Visit #: 2 of 8    Treatment Area: Right knee pain [M25.561]  Right shoulder pain [M25.511]    SUBJECTIVE  Pain Level (0-10 scale): 8-back  Any medication changes, allergies to medications, adverse drug reactions, diagnosis change, or new procedure performed?: [x]  No    []  Yes (see summary sheet for update)  Subjective functional status/changes:   []  No changes reported  Pt reports increased back pain today.     OBJECTIVE    25 min Therapeutic Exercise:  [x]  See flow sheet :   Rationale: increase ROM and increase strength to improve the patient's ability to increase activity tolerance     13 min Neuromuscular Re-education:  [x]   See flow sheet :   Rationale: increase ROM, increase strength, improve coordination, improve balance and increase proprioception  to improve the patient's ability to ambulate in community safely            With   []  TE   []  TA   []  neuro   []  other: Patient Education: [x]  Review HEP    []  Progressed/Changed HEP based on:   []  positioning   []  body mechanics   []  transfers   []  heat/ice application    []  other:      Other Objective/Functional Measures:   Added SLS, 1/2 stance  Added ball/band, supine clamshells     Pain Level (0-10 scale) post treatment: 7/10-back    ASSESSMENT/Changes in Function: Pt making slow progress towards goals. Pt tolerated new exercises well without increased pain or discomfort. Pt challenged with SLS with swaying noted but no LOB. Pt continues to have decreased right knee AROM. Will continue to progress as tolerated to increase functional strength and mobility for ease of ADL's    Patient will continue to benefit from skilled PT services to modify and progress therapeutic interventions, address functional mobility deficits,  address ROM deficits, address strength deficits, analyze and address soft tissue restrictions, analyze and cue movement patterns, analyze and modify body mechanics/ergonomics, assess and modify postural abnormalities, address imbalance/dizziness and instruct in home and community integration to attain remaining goals.     []   See Plan of Care  []   See progress note/recertification  []   See Discharge Summary         Progress towards goals / Updated goals:  1. Pt will demonstrate rightshoulder AROM flexion to 140degreesin order to improve ability to perform dressing  PN status:AROM122deg  2. Pt will demonstrate rightshoulder AROM functional ER to T1in order to improve ability to bathe   PN status: no change =occiput  3. Pt will demonstrate right grossshoulder strength to at least 5-/5 in order to improve ability to lift objects shoulder height and perform heavy ADLs  PN status: no change =Grossly:4/5  4. Pt will demonstrate right knee AROM 0-120degrees in order to improve stair negotiation and walking community distances  PN status:Right knee AROM3-100deg    Current: Regressing: 0-80 deg with pain 11/19/20  5. Pt will demonstrate right knee flex/ext strength and left hip abduction to 5/5 and 4/5, respectively in order to improve walking tolerance  PN status:Knee ext =4/5, flex =3+/5, Hip abd =3-/5    PLAN  [x]   Upgrade activities as tolerated     [x]   Continue plan of care  []   Update interventions per flow sheet       []   Discharge due to:_  []   Other:_      Houston Mace, PTA 11/19/2020  10:09 AM    Future Appointments   Date Time Provider Department Center   11/19/2020  2:15 PM Antoinette Batman [facility]   11/22/2020  3:00 PM [place] Pilgrim, MD [facility]

## 2020-11-20 ENCOUNTER — Encounter

## 2020-11-22 ENCOUNTER — Encounter: Attending: Physical Medicine & Rehabilitation | Primary: Registered Nurse

## 2020-11-22 ENCOUNTER — Ambulatory Visit
Admit: 2020-11-22 | Payer: PRIVATE HEALTH INSURANCE | Attending: Physical Medicine & Rehabilitation | Primary: Registered Nurse

## 2020-11-22 ENCOUNTER — Encounter

## 2020-11-22 ENCOUNTER — Ambulatory Visit: Attending: Physical Medicine & Rehabilitation | Primary: Registered Nurse

## 2020-11-22 DIAGNOSIS — M5416 Radiculopathy, lumbar region: Secondary | ICD-10-CM

## 2020-11-22 NOTE — Progress Notes (Signed)
 Progress Notes by Lake Pilgrim, MD at 11/22/20 1500                Author: Eleesha Purkey D, MD  Service: --  Author Type: Physician       Filed: 11/23/20 1348  Encounter Date: 11/22/2020  Status: Signed          Editor: Lake Pilgrim, MD (Physician)                     [place]  [facility]  [address], [address]   [place], [place] [address]   Phone: [phone]   Fax: [phone]            [name], Short   DOB: 01/30/1956   PCP: Venice Gillis, NP   11/22/2020      NEW PATIENT         HISTORY OF PRESENT ILLNESS  [name] Short is a 65 y.o.  female c/o chronic low back, right leg, and right shoulder pain. She reports edema and heaviness of the RLE. Her LLE occasionally gives out  on her.  Pt notes that she had a stroke affecting the right side, but her leg was not heavy at the time. She sees Dr. Randalyn Bushman for her right knee and shoulder pains. 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  Her claim was denied by [facility] so she did not proceed with treatment.      Pain Score: 9/10.      Treatments patient has tried:   Physical therapy:Unknown   Doing HEP: Unknown   Non-opioid medications: Yes   Spinal injections: No   Spinal surgery- No.    Last Lumbar Spine MRI: None.      PmHx: h/o stroke (2014) affecting the right side; DM; constipation      ASSESSMENT   [name] Short is a 65 y.o.  female with c/o low back pain radiating into the RLE with weakness. Her symptoms may be due to a right L5 radiculopathy. She had weakness in the anterior tibialis and with hip flexion on the right.      PLAN   1. Recommended Miralax  for stool softening/constipation.   2. Lumbar MRI - low back pain radiating into the RLE; weakness of RLE; evaluate lumbar radiculopathy      Pt will f/u after MRI (~4 weeks) or sooner if needed.        Diagnoses and all orders for this visit:      1. Lumbar radiculopathy      2. Low back pain, unspecified back pain  laterality, unspecified chronicity, unspecified whether sciatica present   -     AMB POC XRAY, SPINE, LUMBOSACRAL; 2 O      3. Right leg weakness            CHIEF COMPLAINT  [name] Short is seen today in consultation at the request  of Deatherage, Abraham Hoffmann B, NP for complaints of low back pain into RLE.         PAST MEDICAL HISTORY      Past Medical History:        Diagnosis  Date         ?  Anemia       ?  Arthritis       ?  Diabetes (HCC)       ?  History of colon polyps       ?  HTN (hypertension)       ?  Hyperlipemia       ?  Menopause       ?  Serum calcium elevated           ?  Stroke [facility])               Past Surgical History:         Procedure  Laterality  Date          ?  COLONOSCOPY  N/A  01/13/2017          COLONOSCOPY performed by Geneva Kerbs, MD at [facility]          ?  HX CESAREAN SECTION    1987     ?  HX COLONOSCOPY              ?  HX DILATION AND CURETTAGE    1995             MEDICATIONS       Current Outpatient Medications          Medication  Sig  Dispense  Refill           ?  HumaLOG  Mix 75-25 KwikPen flexpen  INJECT 30 UNITS BENEATH THE SKIN BEFORE BREAKFAST AND DINNER         ?  diclofenac EC (VOLTAREN) 25 mg EC tablet  Take  by mouth two (2) times a day.               ?  ascorbic acid, vitamin C, (VITAMIN C) 250 mg tablet  Take 1 Tab by mouth two (2) times daily (with meals).  60 Tab  0           ?  ferrous sulfate  325 mg (65 mg iron ) tablet  Take 1 Tab by mouth two (2) times daily (with meals).  60 Tab  0     ?  pantoprazole (PROTONIX) 40 mg tablet  Take 1 Tab by mouth daily.  30 Tab  0     ?  furosemide  (LASIX ) 20 mg tablet  Take 20 mg by mouth daily.         ?  EXENATIDE MICROSPHERES (BYDUREON SC)  by SubCUTAneous route every seven (7) days.         ?  cholecalciferol (VITAMIN D3) 1,000 unit tablet  Take 1 tablet by mouth daily.  30 tablet  0     ?  atorvastatin  (LIPITOR ) 40 mg tablet  Take 1 tablet by mouth nightly.  30 tablet  0     ?  clopidogrel  (PLAVIX ) 75 mg tablet  Take 1 tablet  by mouth daily.  30 tablet  2     ?  lisinopril  (PRINIVIL , ZESTRIL ) 5 mg tablet  Take 1 tablet by mouth daily.  30 tablet  0     ?  multivitamin (ONE A DAY) tablet  Take 1 Tab by mouth daily. (Patient not taking: Reported on 09/21/2020)         ?  metOLazone (ZAROXOLYN) 5 mg tablet  Take 2.5 mg by mouth Three (3) times a week. (Patient not taking: Reported on 09/21/2020)               ?  insulin  aspart protamine/insulin  aspart (NOVOLOG MIX 70-30 FLEXPEN) 100 unit/mL (70-30) flex pen  30 units SQ every morning and 25 units SQ every evening (Patient not taking: Reported on 09/21/2020)  1 Package  0  ALLERGIES  No Known Allergies         SOCIAL HISTORY       Social History          Socioeconomic History         ?  Marital status:  SINGLE       Tobacco Use         ?  Smoking status:  Current Every Day Smoker     ?  Smokeless tobacco:  Never Used        ?  Tobacco comment: patient state that she smokes 1 black&mild /daily       Vaping Use         ?  Vaping Use:  Never used       Substance and Sexual Activity         ?  Alcohol use:  No     ?  Drug use:  No         ?  Sexual activity:  Never           FAMILY HISTORY     Family History         Problem  Relation  Age of Onset          ?  Hypertension  Other  35              parent,NOS          ?  Heart Disease  Other  62              parent, NOS          ?  Diabetes  Other  45              parent, NOS          ?  Cancer  Father                REVIEW OF SYSTEMS  Review of Systems    Constitutional: Negative for chills, fever and weight loss.    Respiratory: Negative for shortness of breath.     Cardiovascular: Negative for chest pain.    Gastrointestinal: Negative for constipation.         Negative for fecal incontinence     Genitourinary: Negative for dysuria.         Negative for urinary incontinence    Musculoskeletal: Positive for back pain and joint pain  ( right knee, right shoulder).          RLE pain, edema, heaviness    Skin: Negative for rash.     Neurological: Negative for dizziness, tingling, tremors, focal weakness and headaches.    Endo/Heme/Allergies: Does not bruise/bleed easily.    Psychiatric/Behavioral: The patient does not have insomnia.              PHYSICAL EXAMINATION   Visit Vitals      BP  127/69 (BP 1 Location: Right arm, BP Patient Position: Sitting, BP Cuff Size: Adult)     Pulse  83     Temp  97.7 F (36.5 C)     Resp  16     Ht  4\' 11"  (1.499 m)     Wt  153 lb 6.4 oz (69.6 kg)     SpO2  100%        BMI  30.98 kg/m                 Pain Assessment   11/22/2020  Location of Pain  Back;Shoulder;Knee     Location Modifiers  Lateral     Severity of Pain  9     Quality of Pain  -     Duration of Pain  Persistent     Frequency of Pain  Constant     Aggravating Factors  -     Aggravating Factors Comment  -     Limiting Behavior  -     Relieving Factors  -     Result of Injury  -     Work-Related Injury  -        Type of Injury  -              Constitutional:  Well developed, well nourished, in no acute distress.    Psychiatric: Affect and mood are appropriate.    HEENT: Normocephalic, atraumatic. Extraocular movements intact.   Integumentary : No rashes or abrasions noted on exposed areas.     Cardiovascular: Regular rate and rhythm.    Pulmonary: Clear to auscultation bilaterally.     SPINE/MUSCULOSKELETAL EXAM      Cervical spine:   Neck is midline.    Normal muscle tone.    No focal atrophy is noted.    ROM pain free.    Shoulder ROM intact.    Negative Spurling's sign.    Negative Tinel's sign.    Positive Hoffman's sign on the right.                 Sensation in the bilateral arms grossly intact to light touch.       Lumbar spine:   No rash, ecchymosis, or gross obliquity.    No fasciculations.    No focal atrophy is noted.    No pain with hip ROM.    Full range of motion.   No tenderness to palpation at the sciatic notch.    SI joints non-tender.    Trochanters non tender.       Sensation in the bilateral legs grossly intact to light  touch.      Edema in RLE         MOTOR:                Biceps   Triceps  Deltoids  Wrist Ext  Wrist Flex  Hand Intrin             Right  5/5  5/5  5/5  5/5  5/5  5/5             Left  5/5  5/5  5/5  5/5  5/5  5/5                                        Hip Flex   Quads  Hamstrings  Ankle DF  EHL  Ankle PF             Right  4/5  5/5  5/5  4/5  5/5  5/5             Left  5/5  5/5  5/5  5/5  5/5  5/5        DTRs are 2+ biceps, triceps, brachioradialis, patella, and Achilles.      Negative Straight Leg raise.    Squat not tested.    No difficulty with tandem gait.  Ambulation with single point cane. FWB.        RADIOGRAPHS/DATA  2V (AP/LAT) Lumbar Spine XR images taken on 11/22/20 personally reviewed with patient:   Pelvic obliquity - left side higher than right   Reversal of lumbar lordosis.   Severe disc space narrowing at L4-5, L5-S1   No obvious compression fractures or instabilities      23 minutes of face-to-face contact were spent with the patient during today's visit extensively discussing symptoms and treatment plan.  All questions were answered. More than half of this visit today was spent on counseling.       Written by Antonina Kleine, [employer], as dictated by Dr. Jennett Model.

## 2020-12-03 ENCOUNTER — Other Ambulatory Visit: Payer: Self-pay | Admitting: Student

## 2020-12-03 DIAGNOSIS — I5042 Chronic combined systolic (congestive) and diastolic (congestive) heart failure: Secondary | ICD-10-CM

## 2020-12-06 ENCOUNTER — Encounter: Payer: PRIVATE HEALTH INSURANCE | Primary: Registered Nurse

## 2020-12-06 ENCOUNTER — Encounter: Payer: Self-pay | Admitting: *Deleted

## 2020-12-06 NOTE — Progress Notes (Signed)

## 2020-12-07 ENCOUNTER — Inpatient Hospital Stay: Admit: 2020-12-07 | Payer: PRIVATE HEALTH INSURANCE | Primary: Registered Nurse

## 2020-12-07 ENCOUNTER — Other Ambulatory Visit: Payer: Self-pay

## 2020-12-07 ENCOUNTER — Encounter: Payer: Self-pay | Admitting: Podiatry

## 2020-12-07 ENCOUNTER — Ambulatory Visit (INDEPENDENT_AMBULATORY_CARE_PROVIDER_SITE_OTHER): Payer: Medicare Other | Admitting: Podiatry

## 2020-12-07 DIAGNOSIS — M79674 Pain in right toe(s): Secondary | ICD-10-CM

## 2020-12-07 DIAGNOSIS — L84 Corns and callosities: Secondary | ICD-10-CM | POA: Diagnosis not present

## 2020-12-07 DIAGNOSIS — B351 Tinea unguium: Secondary | ICD-10-CM | POA: Diagnosis not present

## 2020-12-07 DIAGNOSIS — E1142 Type 2 diabetes mellitus with diabetic polyneuropathy: Secondary | ICD-10-CM

## 2020-12-07 DIAGNOSIS — M79675 Pain in left toe(s): Secondary | ICD-10-CM | POA: Diagnosis not present

## 2020-12-07 DIAGNOSIS — M25561 Pain in right knee: Secondary | ICD-10-CM

## 2020-12-07 NOTE — Progress Notes (Signed)
 PT DAILY TREATMENT NOTE 06-21    Patient Name: [name] Short  Date:12/07/2020  DOB: [birth year]  [x]   Patient DOB Verified  Payor: OPTIMA / Plan: [place] OPT[place] HMO / Product Type: HMO /    In time:130  Out time:213  Total Treatment Time (min): 43  Visit #: 3 of 8    Treatment Area: Right knee pain [M25.561]  Right shoulder pain [M25.511]    SUBJECTIVE  Pain Level (0-10 scale): 4 shoulder, 5 knee, 8 back  Any medication changes, allergies to medications, adverse drug reactions, diagnosis change, or new procedure performed?: [x]  No    []  Yes (see summary sheet for update)  Subjective functional status/changes:   []  No changes reported  See PN    OBJECTIVE    13 min Therapeutic Exercise:  [x]  See flow sheet :   Rationale: increase ROM and increase strength to improve the patient's ability to perform ADLs    14 min Therapeutic Activity:  [x]   See flow sheet : reassessment, sit to stands   Rationale: increase strength and improve coordination  to improve the patient's ability to negotiate home and community      8 min Neuromuscular Re-education:  [x]   See flow sheet : glut and core reed   Rationale: increase strength and improve coordination  to improve the patient's ability to tolerate sustained postures    8 min Home Management: Pt edu on HEP compliance and PT attendance in order to maximize functional potential    Rationale: education in order to improve understanding of roles and goals of PT          With   []  TE   []  TA   []  neuro   []  other: Patient Education: [x]  Review HEP    []  Progressed/Changed HEP based on:   []  positioning   []  body mechanics   []  transfers   []  heat/ice application    []  other:      Other Objective/Functional Measures: see PN     Pain Level (0-10 scale) post treatment: 3    ASSESSMENT/Changes in Function: see PN    Patient will continue to benefit from skilled PT services to modify and progress therapeutic interventions, address functional mobility deficits, address ROM deficits, address strength  deficits, analyze and address soft tissue restrictions, analyze and cue movement patterns, analyze and modify body mechanics/ergonomics, assess and modify postural abnormalities, address imbalance/dizziness and instruct in home and community integration to attain remaining goals.     []   See Plan of Care  [x]   See progress note/recertification  []   See Discharge Summary         Progress towards goals / Updated goals:  1. Pt will demonstrate rightshoulder AROM flexion to 140degreesin order to improve ability to perform dressing  PN status:progressing = 127degrees  2. Pt will demonstrate rightshoulder AROM functional ER to T1in order to improve ability to bathe   PN status: no change, laterally at UT  3. Pt will demonstrate right grossshoulder strength to at least 5-/5 in order to improve ability to lift objects shoulder height and perform heavy ADLs  PN status: Progressing = grossly 4+/5  4. Pt will demonstrate right knee AROM 0-120degrees in order to improve stair negotiation and walking community distances  PN status:no change = 4-101degrees  5. Pt will demonstrate left hip abduction to 4/5 in order to improve walking tolerance  PN status: hip abd = 3+/5    PLAN  [x]   Upgrade activities as  tolerated     [x]   Continue plan of care  []   Update interventions per flow sheet       []   Discharge due to:_  []   Other:_      Laine Piggs, PT 12/07/2020  2:06 PM    Future Appointments   Date Time Provider Department Center   12/11/2020 11:30 AM [facility] RM 1 [facility]   12/11/2020 12:00 PM [facility] 2 [facility]   12/13/2020  1:30 PM Laine Piggs, PT [facility]   12/17/2020  3:45 PM Waldon Gula, PT [facility]   12/20/2020 11:15 AM Deward Force Azzie Bollman, MD [facility]

## 2020-12-07 NOTE — Progress Notes (Signed)
 [facility] - [address]  [address]  [place], [place] [address]  [phone] fax    Progress Note  Patient name:[name] Short of Care:10/11/2020   Referral source:Blasdell, Salvatore Decent, MD DOB:1956/06/11   Medical Diagnosis:Right knee pain [M25.561]  Right shoulder pain [M25.511]  Payor: OPTIMA / Plan: [place] OPT[employer] HMO / Product Type: HMO / Onset Date:chronic with exacerbation 09/21/20   Treatment Diagnosis:right shoulder pain, right knee pain, LBP   Prior Hospitalization: see medical history Provider#: [id number]   Medications: Verified on Patient summary List   Comorbidities: chronicity of condition, HTN, DM, OA, tobacco use, CVA  Prior Level of Function:mod (I) with SPC; 3step to enter 1 story home, 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 Pt will demonstrate rightshoulder AROM flexion to 140degreesin order to improve ability to perform dressing  PN status:AROM122deg    progressing = 127degrees  2. Pt will demonstrate rightshoulder AROM functional ER to T1in order to improve ability to bathe   PN status: no change =occiput    no change, laterally at UT  3. Pt will demonstrate right grossshoulder strength to at least 5-/5 in order to improve ability to lift objects shoulder height and perform heavy ADLs  PN status: no change =Grossly:4/5    Progressing = grossly 4+/5  4. Pt will demonstrate right knee AROM 0-120degrees in order to improve stair negotiation and walking community distances  PN status:Right knee AROM3-100deg               no change = 4-101degrees  5. Pt will demonstrate right knee flex/ext strength and left hip abduction to 5/5 and 4/5, respectively in order to improve walking tolerance  PN status:Knee ext =4/5, flex =3+/5, Hip abd =3-/5    partially Met:  knee flex/ext = 5/5, hip abd = 3+/5    Key Functional Changes:   Functional Gains: decreased fear of movement  Functional Deficits: continued pain   % improvement: 40%  Pain   Average: 7/10       Best: 7/10     Worst: 10/10  Patient Goal: "to not to have no pain"    Updated Goals: to be achieved in 4 weeks:  1. Pt will demonstrate rightshoulder AROM flexion to 140degreesin order to improve ability to perform dressing  PN status:progressing = 127degrees  2. Pt will demonstrate rightshoulder AROM functional ER to T1in order to improve ability to bathe   PN status: no change, laterally at UT  3. Pt will demonstrate right grossshoulder strength to at least 5-/5 in order to improve ability to lift objects shoulder height and perform heavy ADLs  PN status: Progressing = grossly 4+/5  4. Pt will demonstrate right knee AROM 0-120degrees in order to improve stair negotiation and walking community distances  PN status:no change = 4-101degrees  5. Pt will demonstrate left hip abduction to 4/5 in order to improve walking tolerance  PN status: hip abd = 3+/5      ASSESSMENT/RECOMMENDATIONS: [name] reports 40% improvement since beginning PT. Pt reports improvement in functional mobility and decreased fear of movement. Her shoulder AROM/strength and knee strength have improved, however, knee ROM remains unchanged. Pt remains on a wait list for right LE lymphedema management, which may be impacting progress. Pt has only been able to attend  x4 visits in the last 2 months; discussed consistency with attendance in order to maximize therapeutic benefits and pt verbalized understanding. Pt will benefit from continued skilled PT in order to address remaining deficits and maximize functional potential, pt will attend 1x/week to better accommodate busy work/MD schedule    [x] Continue therapy per initial plan/protocol at a frequency of  1 x per week for 4  weeks  [] Continue therapy with the following recommended changes:_____________________      _____________________________________________________________________  [] Discontinue therapy progressing towards or have reached established goals  [] Discontinue therapy due to lack of appreciable progress towards goals  [] Discontinue therapy due to lack of attendance or compliance  [] Await Physician's recommendations/decisions regarding therapy  [] Other:________________________________________________________________    Thank you for this referral.    Precious Bard, PT 12/07/2020 1:49 PM  NOTE TO PHYSICIAN:  PLEASE COMPLETE THE ORDERS BELOW AND   FAX TO [facility]: [phone]  If you are unable to process this request in 24 hours please contact our office: [phone]    []   I have read the above report and request that my patient continue as recommended.  []   I have read the above report and request that my patient continue therapy with the following changes/special instructions:________________________________________  []  I have read the above report and request that my patient be discharged from therapy.    Physician's Signature:____________Date:_________TIME:________     Tonia Brooms, MD  ** Signature, Date and Time must be completed for valid certification **

## 2020-12-07 NOTE — Progress Notes (Signed)
Things That May Be Affecting Your Health:  Alcohol  Hearing loss  Pain    Depression  Home Safety  Sexual Health  X Diabetes  Lack of physical activity  Stress   Difficulty with daily activities  Loneliness  Tiredness   Drug use X Medicines X Tobacco use   Falls  Motor Vehicle Safety X Weight  X Food choices  Oral Health  Other    YOUR PERSONALIZED HEALTH PLAN : 1. Schedule your next subsequent Medicare Wellness visit in one year 2. Attend all of your regular appointments to address your medical issues 3. Complete the preventative screenings and services   Annual Wellness Visit   Medicare Covered Preventative Screenings and [place] Men and Women Who How Often Need? Date of Last Service Action  Abdominal Aortic Aneurysm Adults with AAA risk factors Once      Alcohol Misuse and Counseling All Adults Screening once a year if no alcohol misuse. Counseling up to 4 face to face sessions.     Bone Density Measurement  Adults at risk for osteoporosis Once every 2 yrs      Lipid Panel Z13.6 All adults without CV disease Once every 5 yrs       Colorectal Cancer   Stool sample or  Colonoscopy All adults 62 and older   Once every year  Every 10 years X       Depression All Adults Once a year  Today   Diabetes Screening Blood glucose, post glucose load, or GTT Z13.1  All adults at risk  Pre-diabetics  Once per year  Twice per year      Diabetes  Self-Management Training All adults Diabetics 10 hrs first year; 2 hours subsequent years. Requires Copay     Glaucoma  Diabetics  Family history of glaucoma  African Americans 38 yrs +  Hispanic Americans 21 yrs + Annually - requires coppay      Hepatitis C Z72.89 or F19.20  High Risk for HCV  Born between 1945 and 1965  Annually  Once      HIV Z11.4 All adults based on risk  Annually btw ages 5 & 31 regardless of risk  Annually > 65 yrs if at increased risk      Lung Cancer Screening  Asymptomatic adults aged 24-77 with 30 pack yr history and current smoker OR quit within the last 15 yrs Annually Must have counseling and shared decision making documentation before first screen      Medical Nutrition Therapy Adults with   Diabetes  Renal disease  Kidney transplant within past 3 yrs 3 hours first year; 2 hours subsequent years     Obesity and Counseling All adults Screening once a year Counseling if BMI 30 or higher  Today   Tobacco Use Counseling Adults who use tobacco  Up to 8 visits in one year     Vaccines Z23  Hepatitis B  Influenza   Pneumonia  Adults   Once  Once every flu season  Two different vaccines separated by one year     Next Annual Wellness Visit People with Medicare Every year  Today     Services & Screenings Women Who How Often Need  Date of Last Service Action  Mammogram  Z12.31 Women over 62 One baseline ages 98-39. Annually ager 45 yrs+ X     Pap tests All women Annually if high risk. Every 2 yrs for normal risk women  Screening for cervical cancer with   Pap (Z01.419 nl or Z01.411abnl) &  HPV Z11.51 Women aged 19 to 60 Once every 5 yrs     Screening pelvic and breast exams All women Annually if high risk. Every 2 yrs for normal risk women     Sexually Transmitted Diseases  Chlamydia  Gonorrhea  Syphilis All at risk adults Annually for non pregnant females at increased risk         Energy Men Who How Ofter Need  Date of Last Service Action  Prostate Cancer - DRE & PSA Men over 50 Annually.  DRE might require a copay.        Sexually Transmitted Diseases  Syphilis All at risk adults Annually for men at increased risk      Health Maintenance List Health Maintenance  Topic Date Due  . COVID-19 Vaccine (1) Never done  . MAMMOGRAM  11/12/2016  . COLONOSCOPY (Pts 45-41yrs Insurance coverage will need to be confirmed)  07/21/2018  . PAP SMEAR-Modifier  11/03/2019  . INFLUENZA VACCINE   03/04/2021  . HEMOGLOBIN A1C  05/07/2021  . Hepatitis C Screening  Completed  . HIV Screening  Completed  . HPV VACCINES  Aged Out  . FOOT EXAM  Discontinued  . OPHTHALMOLOGY EXAM  Discontinued

## 2020-12-11 ENCOUNTER — Inpatient Hospital Stay: Admit: 2020-12-11 | Payer: PRIVATE HEALTH INSURANCE | Attending: Family | Primary: Registered Nurse

## 2020-12-11 ENCOUNTER — Inpatient Hospital Stay
Admit: 2020-12-11 | Payer: PRIVATE HEALTH INSURANCE | Attending: Physical Medicine & Rehabilitation | Primary: Registered Nurse

## 2020-12-11 DIAGNOSIS — M5416 Radiculopathy, lumbar region: Secondary | ICD-10-CM

## 2020-12-11 MED ORDER — IOPAMIDOL 61 % IV SOLN
300 mg iodine /mL (61 %) | Freq: Once | INTRAVENOUS | Status: AC
Start: 2020-12-11 — End: 2020-12-11
  Administered 2020-12-11: 16:00:00 via INTRAVENOUS

## 2020-12-11 MED FILL — ISOVUE-300  61 % INTRAVENOUS SOLUTION: 300 mg iodine /mL (61 %) | INTRAVENOUS | Qty: 100

## 2020-12-12 LAB — CREATININE, POC
Creatinine, POC: 0.9 MG/DL (ref 0.6–1.3)
GFRAA, POC: >60 mL/min/{1.73_m2} (ref >60)
GFRNA, POC: >60 mL/min/{1.73_m2} (ref >60)

## 2020-12-12 LAB — AMB POC CREATININE
Creatinine, POC: 0.9 mg/dL (ref 0.6–1.3)
GFR African American: >60 mL/min/{1.73_m2} (ref >60)
GFR Non-African American: >60 mL/min/{1.73_m2} (ref >60)

## 2020-12-13 ENCOUNTER — Inpatient Hospital Stay: Admit: 2020-12-13 | Payer: PRIVATE HEALTH INSURANCE | Primary: Registered Nurse

## 2020-12-13 NOTE — Progress Notes (Signed)
PT DAILY TREATMENT NOTE 06-21    Patient Name: [name] Short  Date:12/13/2020  DOB: [birth year]  [x]   Patient DOB Verified  Payor: OPTIMA / Plan: [place] [employer] HMO / Product Type: HMO /    In time:133  Out time:221  Total Treatment Time (min): 48  Visit #: 1 of 4    Treatment Area: Right knee pain [M25.561]  Right shoulder pain [M25.511]    SUBJECTIVE  Pain Level (0-10 scale): 9 back, 5 shoulder  Any medication changes, allergies to medications, adverse drug reactions, diagnosis change, or new procedure performed?: [x]  No    []  Yes (see summary sheet for update)  Subjective functional status/changes:   []  No changes reported  Pt reports her back is bothering her a lot today due to the rain. She tries to stand upright but ends up slumped over anyways     OBJECTIVE     15 min Therapeutic Exercise:  [x]  See flow sheet :   Rationale: increase ROM and increase strength to improve the patient's ability to perform ADLs     23 min Therapeutic Activity:  [x]   See flow sheet : standing functional LE strength   Rationale: increase strength and improve coordination  to improve the patient's ability to negotiate home and community      10 min Neuromuscular Re-education:  [x]   See flow sheet : balance, glut reed   Rationale: increase strength, improve coordination and increase proprioception  to improve the patient's ability to tolerate sustained postures            With   []  TE   []  TA   []  neuro   []  other: Patient Education: [x]  Review HEP    []  Progressed/Changed HEP based on:   []  positioning   []  body mechanics   []  transfers   []  heat/ice application    []  other:      Other Objective/Functional Measures: progressed exercises per flow sheet       Pain Level (0-10 scale) post treatment: 7 back and shoulder    ASSESSMENT/Changes in Function: Pt tolerated progression of exercises with out increase in symptoms.   Challenged with eccentric lowering during sit to stands; improved with significant cuing. Pt requires frequent cuing for  upright posture.     Patient will continue to benefit from skilled PT services to modify and progress therapeutic interventions, address functional mobility deficits, address ROM deficits, address strength deficits, analyze and address soft tissue restrictions, analyze and cue movement patterns, analyze and modify body mechanics/ergonomics, assess and modify postural abnormalities, address imbalance/dizziness and instruct in home and community integration to attain remaining goals.     []   See Plan of Care  []   See progress note/recertification  []   See Discharge Summary         Progress towards goals / Updated goals:  1. Pt will demonstrate rightshoulder AROM flexion to 140degreesin order to improve ability to perform dressing  PN status:progressing = 127degrees   Tolerating progression of exercises, measure NV  2. Pt will demonstrate rightshoulder AROM functional ER to T1in order to improve ability to bathe   PN status: no change, laterally at UT   Tolerating progression of exercises  3. Pt will demonstrate right grossshoulder strength to at least 5-/5 in order to improve ability to lift objects shoulder height and perform heavy ADLs  PN status: Progressing = grossly 4+/5   Tolerating progression of exercises  4. Pt will demonstrate right knee AROM 0-120degrees in  order to improve stair negotiation and walking community distances  PN status:no change = 4-101degrees   Tolerating progression of exercises, measure NV  5. Pt will demonstrate left hip abduction to 4/5 in order to improve walking tolerance  PN status: hip abd = 3+/5   Tolerating progression of exercises    PLAN  [x]   Upgrade activities as tolerated     [x]   Continue plan of care  []   Update interventions per flow sheet       []   Discharge due to:_  []   Other:_      Precious Bard, PT 12/13/2020  1:38 PM    Future Appointments   Date Time Provider Department Center    12/17/2020  3:45 PM Ernst Spell, PT [facility]   12/20/2020 11:15 AM Brooks Sailors, MD [facility]

## 2020-12-15 NOTE — Progress Notes (Signed)
  Subjective:  Patient ID: Julie Elliott, female    DOB: Mar 15, 1956,  MRN: [id number]  Julie Elliott presents to clinic today for at risk foot care with history of diabetic neuropathy and callus(es) b/l feet and painful thick toenails that are difficult to trim. Painful toenails interfere with ambulation. Aggravating factors include wearing enclosed shoe gear. Pain is relieved with periodic professional debridement. Painful calluses are aggravated when weightbearing with and without shoegear. Pain is relieved with periodic professional debridement.   She is accompanied by her son on today's visit.  She has not checked her blood glucose and does not check it on a regular basis.  PCP is Dr. Gilberto Better and last visit was 2 to 3 weeks ago.  Patient voices no new pedal concerns on today's visit.  Allergies  Allergen Reactions  . Loratadine-Pseudoephedrine Er Hives    Claritin-D    Review of Systems: Negative except as noted in the HPI. Objective:   Constitutional Julie Elliott is a pleasant 65 y.o. African American female, morbidly obese in NAD. AAO x 3.   Vascular Neurovascular status unchanged b/l lower extremities. Capillary refill time to digits immediate b/l. Palpable pedal pulses b/l LE. Pedal hair sparse. Lower extremity skin temperature gradient within normal limits. Nonpitting edema noted b/l lower extremities. No cyanosis or clubbing noted.  Neurologic Normal speech. Oriented to person, place, and time. Pt has subjective symptoms of neuropathy. Protective sensation intact 5/5 intact bilaterally with 10g monofilament b/l. Vibratory sensation intact b/l.  Dermatologic Pedal skin with normal turgor, texture and tone bilaterally. No open wounds bilaterally. No interdigital macerations bilaterally. Toenails 1-5 b/l elongated, discolored, dystrophic, thickened, crumbly with subungual debris and tenderness to dorsal palpation. Hyperkeratotic lesion(s) submet head 5 left foot and submet head 5 right foot.   No erythema, no edema, no drainage, no fluctuance.  Orthopedic: Normal muscle strength 5/5 to all lower extremity muscle groups bilaterally. No pain crepitus or joint limitation noted with ROM b/l. No gross bony deformities bilaterally.   Radiographs: None Assessment:   1. Pain due to onychomycosis of toenails of both feet   2. Callus   3. Diabetic peripheral neuropathy associated with type 2 diabetes mellitus ([place])    Plan:  Patient was evaluated and treated and all questions answered.  Onychomycosis with pain -Nails palliatively debridement as below -Educated on self-care  Procedure: Nail Debridement Rationale: Pain Type of Debridement: manual, sharp debridement. Instrumentation: Nail nipper, rotary burr. Number of Nails: 10 -Examined patient. -Continue diabetic foot care principles. -Patient to continue soft, supportive shoe gear daily. -Toenails 1-5 b/l were debrided in length and girth with sterile nail nippers and dremel without iatrogenic bleeding.  -Callus(es) submet head 5 left foot and submet head 5 right foot pared utilizing sterile scalpel blade without complication or incident. Total number debrided =2. -Patient to report any pedal injuries to medical professional immediately. -Patient/POA to call should there be question/concern in the interim.  Return in about 3 months (around 03/09/2021).  Marzetta Board, DPM

## 2020-12-17 ENCOUNTER — Encounter: Payer: PRIVATE HEALTH INSURANCE | Primary: Registered Nurse

## 2020-12-17 ENCOUNTER — Ambulatory Visit: Payer: Medicare Other | Admitting: Cardiology

## 2020-12-20 ENCOUNTER — Ambulatory Visit
Admit: 2020-12-20 | Discharge: 2020-12-20 | Payer: PRIVATE HEALTH INSURANCE | Attending: Physical Medicine & Rehabilitation | Primary: Registered Nurse

## 2020-12-20 ENCOUNTER — Ambulatory Visit: Attending: Physical Medicine & Rehabilitation | Primary: Registered Nurse

## 2020-12-20 DIAGNOSIS — M5416 Radiculopathy, lumbar region: Secondary | ICD-10-CM

## 2020-12-20 NOTE — Progress Notes (Signed)
 Progress Notes by Lake Pilgrim, MD at 12/20/20 1115                Author: Lake Pilgrim, MD  Service: --  Author Type: Physician       Filed: 12/21/20 1156  Encounter Date: 12/20/2020  Status: Signed          Editor: Lake Pilgrim, MD (Physician)                        [place]  [facility]  [address]   [place], [place] [address]   Phone: [phone]   Fax: [phone]            [name] Short, [name] Short   DOB: [birth year]   PCP: Venice Gillis, NP   12/20/2020      PROGRESS NOTE         HISTORY OF PRESENT ILLNESS  [name] Short is a 65 y.o.  female who was seen as a new patient c/o chronic low back, right leg, and right shoulder pain. She reports edema and heaviness of the RLE. Her  LLE occasionally gives out on her.  Pt notes that she had a stroke affecting the right side, but her leg was not heavy at the time. She sees Dr. Randalyn Bushman for her right knee and shoulder pains. 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 Her claim was denied by [facility] so she did not proceed with treatment.      [name] Short comes in to the office today for f/u. She saw her endocrinologist, and she was told her calcium was too high  because of an issue with her parathyroid. She continues to have low back pain and heaviness in the RLE. Her back pain is worse with prolonged sitting, so she has to get up and move/stretch. She is attending PT and doing stretches. She has h/o stroke,  but she notes that her leg did not feel heavy until more recently. Lumbar spine MRI dated 12/11/20 reviewed. Per report, Right-sided disease,  with posterior displacement of right S1 nerve root and compression of right exiting L5 nerve root. Left-sided disease, central stenosis and additional findings as described.      Pain Score: 8/10.      Treatments patient has tried:   Physical therapy: Yes - knee and shoulder    Doing HEP: Unknown   Non-opioid medications: Yes    Spinal injections: No   Spinal surgery- No.    Last Lumbar Spine MRI: 2022      PmHx: h/o stroke (2014) affecting the right side; DM; constipation      ASSESSMENT   [name] Short is a 65 y.o.  female with c/o low back pain radiating into the RLE with weakness. Her symptoms may be due to a right L5 radiculopathy vs lumbar spinal stenosis (compression of exiting right L5; severe central stenosis  L4-5). She had weakness in the anterior tibialis and with hip flexion on the right. She also has h/o stroke affecting the right side.       We discussed options of: surgical consult, neuromodulators, right L5 TFESI      PLAN   1. She can call if she would like to proceed with surgical consult, neuromodulators, or right L5 TFESI.      Pt will f/u in 4 months or sooner as needed.  Diagnoses and all orders for this visit:      1. 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 H/O: stroke             PAST MEDICAL HISTORY      Past Medical History:        Diagnosis  Date         ?  Anemia       ?  Arthritis       ?  Diabetes (HCC)       ?  History of colon polyps       ?  HTN (hypertension)       ?  Hyperlipemia       ?  Menopause       ?  Serum calcium  elevated           ?  Stroke [facility])               Past Surgical History:         Procedure  Laterality  Date          ?  COLONOSCOPY  N/A  01/13/2017          COLONOSCOPY performed by Geneva Kerbs, MD at [facility]          ?  HX CESAREAN SECTION    1987     ?  HX COLONOSCOPY              ?  HX DILATION AND CURETTAGE    1995     .          MEDICATIONS    Current Outpatient Medications          Medication  Sig  Dispense  Refill           ?  HumaLOG  Mix 75-25 KwikPen flexpen  INJECT 30 UNITS BENEATH THE SKIN BEFORE BREAKFAST AND DINNER               ?  diclofenac EC (VOLTAREN) 25 mg EC tablet  Take  by mouth two (2) times a day.               ?  ascorbic acid, vitamin C, (VITAMIN C) 250 mg tablet  Take 1 Tab by mouth two  (2) times daily (with meals).  60 Tab  0     ?  ferrous sulfate  325 mg (65 mg iron ) tablet  Take 1 Tab by mouth two (2) times daily (with meals).  60 Tab  0     ?  pantoprazole (PROTONIX) 40 mg tablet  Take 1 Tab by mouth daily.  30 Tab  0     ?  multivitamin (ONE A DAY) tablet  Take 1 Tab by mouth daily. (Patient not taking: Reported on 09/21/2020)         ?  metOLazone (ZAROXOLYN) 5 mg tablet  Take 2.5 mg by mouth Three (3) times a week. (Patient not taking: Reported on 09/21/2020)         ?  furosemide  (LASIX ) 20 mg tablet  Take 20 mg by mouth daily.         ?  EXENATIDE MICROSPHERES (BYDUREON SC)  by SubCUTAneous route every seven (7) days.         ?  cholecalciferol (VITAMIN D3) 1,000 unit tablet  Take 1 tablet by mouth daily.  30 tablet  0     ?  insulin  aspart protamine/insulin  aspart (NOVOLOG MIX 70-30 FLEXPEN) 100 unit/mL (70-30) flex pen  30 units SQ every morning and 25 units SQ every evening (Patient not taking: Reported on 09/21/2020)  1 Package  0     ?  atorvastatin  (LIPITOR ) 40 mg tablet  Take 1 tablet by mouth nightly.  30 tablet  0     ?  clopidogrel  (PLAVIX ) 75 mg tablet  Take 1 tablet by mouth daily.  30 tablet  2           ?  lisinopril  (PRINIVIL , ZESTRIL ) 5 mg tablet  Take 1 tablet by mouth daily.  30 tablet  0            ALLERGIES  No Known Allergies         SOCIAL HISTORY       Social History          Socioeconomic History         ?  Marital status:  SINGLE       Tobacco Use         ?  Smoking status:  Current Every Day Smoker     ?  Smokeless tobacco:  Never Used        ?  Tobacco comment: patient state that she smokes 1 black&mild /daily       Vaping Use         ?  Vaping Use:  Never used       Substance and Sexual Activity         ?  Alcohol use:  No     ?  Drug use:  No         ?  Sexual activity:  Never           FAMILY HISTORY   Family History         Problem  Relation  Age of Onset          ?  Hypertension  Other  35              parent,NOS          ?  Heart Disease  Other  62               parent, NOS          ?  Diabetes  Other  45              parent, NOS          ?  Cancer  Father                REVIEW OF SYSTEMS  Review of Systems    Constitutional: Negative for chills, fever and weight loss.    Respiratory: Negative for shortness of breath.     Cardiovascular: Negative for chest pain.    Gastrointestinal: Negative for constipation.         Negative for fecal incontinence     Genitourinary: Negative for dysuria.         Negative for urinary incontinence    Musculoskeletal: Positive for back pain and joint pain  (right knee, right shoulder).    Skin: Negative for rash.    Neurological: Negative for dizziness, tingling, tremors, focal weakness and headaches.         RLE pain, edema, heaviness     Endo/Heme/Allergies: Does not bruise/bleed easily.    Psychiatric/Behavioral: The patient does not have insomnia.  PHYSICAL EXAMINATION   Visit Vitals      BP  126/67 (BP 1 Location: Left upper arm, BP Patient Position: Sitting, BP Cuff Size: Adult)     Pulse  100     Temp  98.6 F (37 C) (Temporal)     Ht  4\' 11"  (1.499 m)     Wt  151 lb (68.5 kg)         SpO2  99%  Comment: RA        BMI  30.50 kg/m              Pain Assessment   12/20/2020        Location of Pain  Back     Location Modifiers  -     Severity of Pain  8     Quality of Pain  Aching     Duration of Pain  Persistent     Frequency of Pain  Intermittent     Aggravating Factors  Walking;Other (Comment)     Aggravating Factors Comment  weather, when I do not stretch     Limiting Behavior  Yes     Relieving Factors  Other (Comment);Heat     Relieving Factors Comment  stretching     Result of Injury  No     Work-Related Injury  -        Type of Injury  -                 Constitutional:  Well developed, well nourished, in no acute distress.    Psychiatric: Affect and mood are appropriate.   Integumentary : No rashes or abrasions noted on exposed areas.          SPINE/MUSCULOSKELETAL EXAM      Cervical spine:   Neck is midline.     Normal muscle tone.    No focal atrophy is noted.    ROM pain free.    Shoulder ROM intact.    Negative Spurling's sign.    Negative Tinel's sign.    Positive Hoffman's sign on the right.                                                                                                                               Sensation in the bilateral arms grossly intact to light touch.       Lumbar spine:   No rash, ecchymosis, or gross obliquity.    No fasciculations.    No focal atrophy is noted.    No pain with hip ROM.    Full range of motion.   No tenderness to palpation at the sciatic notch.    SI joints non-tender.    Trochanters non tender.       Sensation in the bilateral legs grossly intact to light touch.      Edema in RLE  MOTOR:                Biceps   Triceps  Deltoids  Wrist Ext  Wrist Flex  Hand Intrin             Right  5/5  5/5  5/5  5/5  5/5  5/5             Left  5/5  5/5  5/5  5/5  5/5  5/5                                        Hip Flex   Quads  Hamstrings  Ankle DF  EHL  Ankle PF             Right  4/5  5/5  5/5  4/5  5/5  5/5             Left  5/5  5/5  5/5  5/5  5/5  5/5        DTRs are 2+ biceps, triceps, brachioradialis, patella, and Achilles.      Negative Straight Leg raise.    Squat not tested.    No difficulty with tandem gait.       Ambulation with single point cane. FWB.        RADIOGRAPHS/DATA   Lumbar Spine MRI images taken on 12/11/20 personally reviewed with patient:   Enlarged L4-L5, with near complete block fusion. Mild scoliosis, right   convexity. Mild straightening of the lumbar lordosis with mild retrolisthesis of   the fused L4-L5 segment. No focal compression deformity. Schmorl's formation,   discogenic endplate edema at Z6-X0. No pathologic marrow signal. Conus   medullaris ends at L2 with normal morphology and signal intensity      Sagittal images show posterior disc bulge with mild cord contact at T10-T11 and   T11-T12. No cord compression. Severe  bilateral foraminal stenosis when combined   with facet and ligamentous hypertrophy.      T12-L1: Right posterior lateral disc protrusion/foraminal disc protrusion. No   central stenosis or cord contact. Patent left foramen. Moderately severe right   foraminal stenosis.      L1-L2: No disc herniation or central stenosis. Facet and ligamentous hypertrophy   with mild to moderate foraminal stenosis.      L2-L3: Mild posterior disc bulge. Prominent posterior epidural fat. AP canal   measures 9.3 mm, mild central stenosis. Facet and ligamentous hypertrophy with   moderate bilateral foraminal stenosis.      L3-L4: Posterior disc bulge. Severe facet and ligamentous hypertrophy. Facet   joint inflammation and trace joint effusion. Edema in the right L4 pedicle.   Overall, AP canal measures 6 mm, moderately severe to severe central stenosis.   Moderately severe foraminal stenosis with suspected mild compression of left   exiting L3 nerve root.      L4-L5: Posterior disc bulge and endplate spondylosis. Severe facet and   ligamentous hypertrophy. Severe central stenosis. Severe left and moderate right   foraminal stenosis. Compression of left exiting L4 nerve root. Mild compression   of right exiting L4 nerve root also possible.      L5-S1: Right posterior lateral disc protrusion and endplate spondylosis.   Displacement of the right descending S1 nerve root. Moderate central stenosis.   Facet and ligamentous hypertrophy with mild inflammation. Severe right and   moderate left  foraminal stenosis. Compression of right exiting L5 nerve root.      IMPRESSION   1. Right-sided disease, with posterior displacement of right S1 nerve root and   compression of right exiting L5 nerve root.   2. Left-sided disease, central stenosis and additional findings as described.     2V (AP/LAT) Lumbar Spine XR images taken on 11/22/20 personally reviewed with patient:   Pelvic obliquity - left side higher than right   Reversal of lumbar  lordosis.   Severe disc space narrowing at L4-5, L5-S1   No obvious compression fractures or instabilities         20 minutes of face-to-face contact were spent with the patient during today's visit extensively discussing symptoms and treatment plan.  All questions were answered. More than half of this visit today was spent on counseling.       Written by Antonina Kleine, [place] as dictated by Belmont Valli, Stephani Janak, MD

## 2020-12-20 NOTE — Progress Notes (Signed)
 [name] Short presents today for   Chief Complaint   Patient presents with   . Back Pain       Is someone accompanying this pt? Yes, female    Is the patient using any DME equipment during OV? Yes, cane    Depression Screening:  3 most recent PHQ Screens 05/04/2019   Little interest or pleasure in doing things Not at all   Feeling down, depressed, irritable, or hopeless Not at all   Total Score PHQ 2 0       Coordination of Care:  1. Have you been to the ER, urgent care clinic since your last visit? no  Hospitalized since your last visit? no    2. Have you seen or consulted any other health care providers outside of the [facility] since your last visit? no Include any pap smears or colon screening. no

## 2020-12-22 ENCOUNTER — Encounter: Payer: Self-pay | Admitting: *Deleted

## 2020-12-24 ENCOUNTER — Inpatient Hospital Stay: Admit: 2020-12-24 | Payer: PRIVATE HEALTH INSURANCE | Primary: Registered Nurse

## 2020-12-24 ENCOUNTER — Encounter: Primary: Registered Nurse

## 2020-12-24 NOTE — Progress Notes (Signed)
PT DAILY TREATMENT NOTE 06-21    Patient Name: [name] Short  Date:12/24/2020  DOB: Jun 01, 1956  [x]   Patient DOB Verified  Payor: OPTIMA / Plan: [place] [employer] HMO / Product Type: HMO /    In time:218  Out time:302  Total Treatment Time (min): 44  Visit #: 2 of 4    Treatment Area: Right knee pain [M25.561]  Right shoulder pain [M25.511]    SUBJECTIVE  Pain Level (0-10 scale): 9 back, 8 in shoulder and knee  Any medication changes, allergies to medications, adverse drug reactions, diagnosis change, or new procedure performed?: [x]  No    []  Yes (see summary sheet for update)  Subjective functional status/changes:   []  No changes reported  Pt reports the weather is making her hurt. She is fairly busy     OBJECTIVE    10 min Therapeutic Exercise:  [x]  See flow sheet :   Rationale: increase ROM and increase strength to improve the patient's ability to perform ADLs      23 min Therapeutic Activity:  [x]   See flow sheet : standing functional LE strength   Rationale: increase strength and improve coordination  to improve the patient's ability to normalize gait     11 min Neuromuscular Re-education:  [x]   See flow sheet : balance, core reed   Rationale: increase strength, improve coordination, improve balance and increase proprioception  to improve the patient's ability to improve posture          With   []  TE   []  TA   []  neuro   []  other: Patient Education: [x]  Review HEP    []  Progressed/Changed HEP based on:   []  positioning   []  body mechanics   []  transfers   []  heat/ice application    []  other:      Other Objective/Functional Measures: maintained program     Pain Level (0-10 scale) post treatment: 9 back, 8 in shoulder and knee    ASSESSMENT/Changes in Function: Maintained program due to reports of severe pain levels today. Frequent cuing for upright posture and SBA/CGA due to concern for several LOB. Reassess NV    Patient will continue to benefit from skilled PT services to modify and progress therapeutic interventions,  address functional mobility deficits, address ROM deficits, address strength deficits, analyze and address soft tissue restrictions, analyze and cue movement patterns, analyze and modify body mechanics/ergonomics, assess and modify postural abnormalities, address imbalance/dizziness and instruct in home and community integration to attain remaining goals.     []   See Plan of Care  []   See progress note/recertification  []   See Discharge Summary         Progress towards goals / Updated goals:  1. Pt will demonstrate rightshoulder AROM flexion to 140degreesin order to improve ability to perform dressing  PN status:progressing = 127degrees              reassess NV   2. Pt will demonstrate rightshoulder AROM functional ER to T1in order to improve ability to bathe   PN status: no change, laterally at UT              reassess NV   3. Pt will demonstrate right grossshoulder strength to at least 5-/5 in order to improve ability to lift objects shoulder height and perform heavy ADLs  PN status: Progressing = grossly 4+/5              reassess NV   4. Pt will demonstrate  right knee AROM 0-120degrees in order to improve stair negotiation and walking community distances  PN status:no change = 4-101degrees              reassess NV   5. Pt will demonstrate left hip abduction to 4/5 in order to improve walking tolerance  PN status: hip abd = 3+/5              reassess NV       PLAN  [x]   Upgrade activities as tolerated     [x]   Continue plan of care  []   Update interventions per flow sheet       []   Discharge due to:_  []   Other:_      Precious Bard, PT 12/24/2020  3:05 PM    Future Appointments   Date Time Provider Department Center   01/03/2021  5:15 PM Lloyd Huger, PT [facility]   05/02/2021  1:15 PM Brooks Sailors, MD [facility]

## 2021-01-01 ENCOUNTER — Encounter: Payer: Self-pay | Admitting: Cardiology

## 2021-01-01 ENCOUNTER — Other Ambulatory Visit: Payer: Self-pay

## 2021-01-01 ENCOUNTER — Ambulatory Visit (INDEPENDENT_AMBULATORY_CARE_PROVIDER_SITE_OTHER): Payer: Medicare Other | Admitting: Cardiology

## 2021-01-01 VITALS — BP 122/62 | HR 80 | Ht 63.0 in | Wt 240.0 lb

## 2021-01-01 DIAGNOSIS — E1169 Type 2 diabetes mellitus with other specified complication: Secondary | ICD-10-CM | POA: Diagnosis not present

## 2021-01-01 DIAGNOSIS — I10 Essential (primary) hypertension: Secondary | ICD-10-CM

## 2021-01-01 DIAGNOSIS — R600 Localized edema: Secondary | ICD-10-CM | POA: Diagnosis not present

## 2021-01-01 DIAGNOSIS — I5042 Chronic combined systolic (congestive) and diastolic (congestive) heart failure: Secondary | ICD-10-CM

## 2021-01-01 DIAGNOSIS — E669 Obesity, unspecified: Secondary | ICD-10-CM

## 2021-01-01 DIAGNOSIS — Z7189 Other specified counseling: Secondary | ICD-10-CM

## 2021-01-01 NOTE — Patient Instructions (Signed)
Medication Instructions:  Your Physician recommend you continue on your current medication as directed.    *If you need a refill on your cardiac medications before your next appointment, please call your pharmacy*   Lab Work: None ordered today   Testing/Procedures: None ordered today   Follow-Up: At [facility], you and your health needs are our priority.  As part of our continuing mission to provide you with exceptional heart care, we have created designated Provider Care Teams.  These Care Teams include your primary Cardiologist (physician) and Advanced Practice Providers (APPs -  Physician Assistants and Nurse Practitioners) who all work together to provide you with the care you need, when you need it.  We recommend signing up for the patient portal called "MyChart".  Sign up information is provided on this After Visit Summary.  MyChart is used to connect with patients for Virtual Visits (Telemedicine).  Patients are able to view lab/test results, encounter notes, upcoming appointments, etc.  Non-urgent messages can be sent to your provider as well.   To learn more about what you can do with MyChart, go to [email or web address].    Your next appointment:   6 month(s) @ [address] [place], [place] [address]   The format for your next appointment:   In Person  Provider:   Bridgette Christopher, MD     

## 2021-01-01 NOTE — Progress Notes (Signed)
Cardiology Office Note:    Date:  01/01/2021   ID:  Julie Elliott, DOB [birth year], MRN [id number]  PCP:  Angelica Pou, MD  Cardiologist:  Buford Dresser, MD  Referring MD: Mosetta Anis, MD   No chief complaint on file.  CC: follow up  History of Present Illness:    Julie Elliott is a 65 y.o. female with a hx of chronic systolic heart failure (prior EF 30-35%, most recently 40-45%), hypertension, hyperlipidemia, type II diabetes who is seen in follow up today. She was initially seen as a new consult at the request of Mosetta Anis, MD for the evaluation and management of chronic systolic heart failure on 05/31/18 (please see note for full history).  Brief cardiac history: first episode of CHF was in 1994, when she thought she had a chest cold. She had a heart cath at [facility], was told she had no blockages. Was put on the heart transplant list, started on medications. Was followed routinely, in the late 90s told heart had recovered and didn't need transplant any more. She has been on and off cardiac medications multiple times in the past.   Workup to date: Echo 08/31/18 EF 40-45%, G1DD Echo 03/02/18 EF 25-85%, grade 2 diastolic dysfunction, diffuse hypokinesis. Mild MR. Prior echo 11/18/2005: EF 55-65% Prior echo 3.18.2004: EF 30-40%  Today: She is accompanied by her son. Overall, her LE edema is fine but she notes the joint inflammation is bothersome. However, they note the inflammation has improved dramatically at this time. Otherwise, she is feeling well and has no new complaints.  She has lost 2 pounds since her last visit. She regularly uses her walker.   She denies any chest pain, shortness of breath, palpitations, or exertional symptoms. No headaches, lightheadedness, or syncope to report. Also has no orthopnea or PND.   Past Medical History:  Diagnosis Date  . CHF (congestive heart failure) ([place])   . Chronic headaches   . DJD (degenerative joint disease)   . DM  (diabetes mellitus) ([place]) 2008   stable HgBA1C at 6.5  . GERD (gastroesophageal reflux disease)    well controlled on Omeprazole  . HLD (hyperlipidemia) 2008   stable, well controlled  . Hypertension    well controlled    Past Surgical History:  Procedure Laterality Date  . carpel tunnel Right    20 yrs ago  . CERVICAL SPINE SURGERY  2016  . TUBAL LIGATION      Current Medications: Current Outpatient Medications on File Prior to Visit  Medication Sig  . aspirin 81 MG EC tablet Take 1 tablet (81 mg total) by mouth daily.  Marland Kitchen atorvastatin (LIPITOR) 40 MG tablet Take 1 tablet (40 mg total) by mouth daily.  Marland Kitchen buPROPion (WELLBUTRIN SR) 150 MG 12 hr tablet Take 1 tablet (150 mg total) by mouth 2 (two) times daily.  . Empagliflozin-metFORMIN HCl 5-500 MG TABS Take 5 mg by mouth 2 (two) times daily.  . enalapril (VASOTEC) 5 MG tablet TAKE 1 TABLET BY MOUTH TWICE DAILY.  . furosemide (LASIX) 40 MG tablet TAKE ONE TABLET BY MOUTH TWICE DAILY  . metoprolol succinate (TOPROL-XL) 50 MG 24 hr tablet TAKE 1 TABLET DAILY WITH OR IMMEDIATELY FOLLOWING A MEAL.  Marland Kitchen potassium chloride SA (KLOR-CON) 20 MEQ tablet Take 1 tablet (20 mEq total) by mouth daily.  . pregabalin (LYRICA) 75 MG capsule TAKE 1 CAPSULE IN THE MORNING, 1 CAPSULE IN THE EVENING AND 2 AT BEDTIME. Please see if primary  care will refill this going forward.  . spironolactone (ALDACTONE) 25 MG tablet Take 1 tablet (25 mg total) by mouth daily.   No current facility-administered medications on file prior to visit.     Allergies:   Loratadine-pseudoephedrine er   Social History   Tobacco Use  . Smoking status: Current Some Day Smoker    Types: Cigarettes    Last attempt to quit: 04/18/2013    Years since quitting: 7.7  . Smokeless tobacco: Current User  . Tobacco comment: 1-2 cigs in the a.m.  Vaping Use  . Vaping Use: Never used  Substance Use Topics  . Alcohol use: No    Alcohol/week: 0.0 standard drinks  . Drug use: No     Family History: The patient's family history includes Diabetes in her mother; Heart disease in her mother; Prostate cancer in her father. There is no history of Neuropathy.  ROS:   Please see the history of present illness.   (+) Bilateral LE inflammation (+) Bilateral LE edema Additional pertinent ROS otherwise unremarkable.   EKGs/Labs/Other Studies Reviewed:    The following studies were reviewed today: Echo 08/31/18 The left ventricle appears to be normal in size, have normal wall thickness, with mild-moderately reduced systolic function of 81-19%. Echo evidence of impaired relaxation in diastolic filling patterns.  Echo 03/02/18 - Left ventricle: The cavity size was normal. Wall thickness was   normal. Systolic function was moderately to severely reduced. The   estimated ejection fraction was in the range of 30% to 35%.   Diffuse hypokinesis. Features are consistent with a pseudonormal   left ventricular filling pattern, with concomitant abnormal   relaxation and increased filling pressure (grade 2 diastolic   dysfunction). - Mitral valve: There was mild regurgitation. Valve area by   continuity equation (using LVOT flow): 2.27 cm^2.  EKG:  EKG is personally reviewed today.   01/01/2021: EKG is not ordered today. 10/31/19: normal sinus rhythm, nonspecific t wave abnormalities  Recent Labs: 05/07/2020: Magnesium 2.0 11/05/2020: BUN 8; Creatinine, Ser 0.75; Potassium 3.7; Sodium 137  Recent Lipid Panel    Component Value Date/Time   CHOL 158 03/30/2018 1605   TRIG 75 03/30/2018 1605   HDL 43 03/30/2018 1605   CHOLHDL 3.7 03/30/2018 1605   CHOLHDL 2.6 02/27/2015 1641   VLDL 15 02/27/2015 1641   LDLCALC 100 (H) 03/30/2018 1605    Physical Exam:    VS:  BP 122/62 (BP Location: Right Arm, Patient Position: Sitting, Cuff Size: Large)   Pulse 80   Ht 5\' 3"  (1.6 m)   Wt 240 lb (108.9 kg)   BMI 42.51 kg/m     Wt Readings from Last 3 Encounters:  01/01/21 240 lb  (108.9 kg)  11/05/20 239 lb 8 oz (108.6 kg)  05/07/20 242 lb 6.4 oz (110 kg)    GEN: Well nourished, well developed in no acute distress HEENT: Normal, moist mucous membranes NECK: No JVD CARDIAC: regular rhythm, normal S1 and S2, no rubs or gallops. No murmur. VASCULAR: Radial and DP pulses 2+ bilaterally. No carotid bruits RESPIRATORY:  Clear to auscultation without rales, wheezing or rhonchi  ABDOMEN: Soft, non-tender, non-distended MUSCULOSKELETAL:  Ambulates independently SKIN: Warm and dry, trivial bilateral LE edema NEUROLOGIC:  Alert and oriented x 3. No focal neuro deficits noted. PSYCHIATRIC:  Normal affect    ASSESSMENT:    1. Chronic combined systolic and diastolic congestive heart failure ([place])   2. Essential hypertension   3. Diabetes mellitus type 2  in obese ([place])   4. Bilateral lower extremity edema   5. Cardiac risk counseling    PLAN:    Chronic combined systolic and diastolic heart failure:  -reports that she is feeling well, stable signs/symptoms. NYHA class II -as previously, declines ischemic workup as she is asymptomatic and was told it was normal in the past. Not interested in entresto. -on enalapril 5 mg BID, metoprolol succinate 50 mg daily, lasix 40 mg BID.  -reiterated heart failure education -continue spirolonactone 25 mg daily -trivial BL LE edema today  CV risk prevention, unknown CAD status -on aspirin 81 mg and atorvastatin 40 mg for prevention, tolerating.  Hypertension: at goal -On enalapril and metoprolol as above  Type II diabetes with chronic heart failure -on empagliflozin-metformin, tolerating  Plan for follow up: 6 mos or sooner as needed   Buford Dresser, MD, PhD [place]  [facility]   Medication Adjustments/Labs and Tests Ordered: Current medicines are reviewed at length with the patient today.  Concerns regarding medicines are outlined above.  No orders of the defined types were placed in this  encounter.  No orders of the defined types were placed in this encounter.   Patient Instructions  Medication Instructions:  Your Physician recommend you continue on your current medication as directed.    *If you need a refill on your cardiac medications before your next appointment, please call your pharmacy*   Lab Work: None ordered today   Testing/Procedures: None ordered today   Follow-Up: At [facility], you and your health needs are our priority.  As part of our continuing mission to provide you with exceptional heart care, we have created designated Provider Care Teams.  These Care Teams include your primary Cardiologist (physician) and Advanced Practice Providers (APPs -  Physician Assistants and Nurse Practitioners) who all work together to provide you with the care you need, when you need it.  We recommend signing up for the patient portal called "MyChart".  Sign up information is provided on this After Visit Summary.  MyChart is used to connect with patients for Virtual Visits (Telemedicine).  Patients are able to view lab/test results, encounter notes, upcoming appointments, etc.  Non-urgent messages can be sent to your provider as well.   To learn more about what you can do with MyChart, go to [email or web address].    Your next appointment:   6 month(s) @ [address] [place], [place] [address]   The format for your next appointment:   In Person  Provider:   Buford Dresser, MD       [facility] Stumpf,acting as a scribe for Buford Dresser, MD.,have documented all relevant documentation on the behalf of Buford Dresser, MD,as directed by  Buford Dresser, MD while in the presence of Buford Dresser, MD.  I, Buford Dresser, MD, have reviewed all documentation for this visit. The documentation on 01/08/21 for the exam, diagnosis, procedures, and orders are all accurate and complete.  Signed, Buford Dresser, MD PhD 01/01/2021    [facility]

## 2021-01-03 ENCOUNTER — Inpatient Hospital Stay: Admit: 2021-01-03 | Payer: PRIVATE HEALTH INSURANCE | Primary: Registered Nurse

## 2021-01-03 DIAGNOSIS — M25561 Pain in right knee: Secondary | ICD-10-CM

## 2021-01-03 NOTE — Progress Notes (Signed)
 PT DAILY TREATMENT NOTE 06-21    Patient Name: [name] Short  Date:01/03/2021  DOB: 24-Aug-1955  [x]   Patient DOB Verified  Payor: OPTIMA / Plan: [place] OPTIM[employer] HMO / Product Type: HMO /    In time: 5:27  Out time: 6:15  Total Treatment Time (min): 48  Visit #: 3 of 4    Treatment Area: Right knee pain [M25.561]  Right shoulder pain [M25.511]    SUBJECTIVE   Pain Level (0-10 scale): 9/10 on back and bilateral knees, and 5/10 on bilateral shoulders   Any medication changes, allergies to medications, adverse drug reactions, diagnosis change, or new procedure performed?: [x]  No    []  Yes (see summary sheet for update)  Subjective functional status/changes:   []  No changes reported  Pt states, "I think I don't need therapy anymore"    OBJECTIVE    22 min Therapeutic Exercise:  [x]  See flow sheet :   Rationale: increase ROM, increase strength and improve coordination to improve the patient's ability to tolerate ADL's    26 min Therapeutic Activity:  [x]   See flow sheet : RDL's, PPT's, Reassesed ROM/strength, FOTO completed, HEP given/demonstrated    Rationale: increase ROM, increase strength, improve coordination and increase proprioception  to improve the patient's ability to tolerate repetitive transfers        With   [x]  TE   [x]  TA   []  neuro   []  other: Patient Education: [x]  Review HEP    [x]  Progressed/Changed HEP based on:   [x]  positioning   [x]  body mechanics   [x]  transfers   []  heat/ice application    []  other:      Other Objective/Functional Measures: See DC    Pain Level (0-10 scale) post treatment: 6/10 on back and right shoulder knee, 7/10 right knee    ASSESSMENT/Changes in Function: See DC    Patient will continue to benefit from skilled PT services to modify and progress therapeutic interventions, address functional mobility deficits, address ROM deficits, address strength deficits, analyze and address soft tissue restrictions, analyze and cue movement patterns, analyze and modify body mechanics/ergonomics,  assess and modify postural abnormalities, address imbalance/dizziness and instruct in home and community integration to attain remaining goals.     []   See Plan of Care  [x]   See progress note/recertification  []   See Discharge Summary         Progress towards goals / Updated goals:  1. Pt will demonstrate rightshoulder AROM flexion to 140degreesin order to improve ability to perform dressing  PN status:progressing = 127degrees  Current:  Met, 150 degrees  2. Pt will demonstrate rightshoulder AROM functional ER to T1in order to improve ability to bathe   PN status: no change, laterally at UT  Current: back of head, regressed  3. Pt will demonstrate right grossshoulder strength to at least 5-/5 in order to improve ability to lift objects shoulder height and perform heavy ADLs  PN status: Progressing = grossly 4+/5  Current: Not met, 4-/5 regressed   4. Pt will demonstrate right knee AROM 0-120degrees in order to improve stair negotiation and walking community distances  PN status:no change = 4-101degrees  Current:  Not met, progressed/regressed to 0-96 degrees  5. Pt will demonstrate left hip abduction to 4/5 in order to improve walking tolerance  PN status: hip abd = 3+/5  Current: Not met, 3+/5    PLAN  []   Upgrade activities as tolerated     []   Continue plan of  care  []   Update interventions per flow sheet       [x]   Discharge due to: pt progressing towards independence, and advised she is able to complete HEP   []   Other:_      Julian Obey, PT 01/03/2021  5:33 PM    Future Appointments   Date Time Provider Department Center   01/08/2021  4:45 PM Debria Fang, PT [facility]   05/02/2021  1:15 PM [place], MD [facility]

## 2021-01-03 NOTE — Progress Notes (Signed)
[facility] - [address]  [address]  [place], [place] [address]  [phone] fax    Discharge Summary  Patient name: [name] Short Start of Care: 10/11/2020   Referral source: Tonia Brooms, MD DOB: [birth year]                Medical Diagnosis: Right knee pain [M25.561]  Right shoulder pain [M25.511]  Payor: OPTIMA / Plan: [place] OPTIMA HMO / Product Type: HMO /  Onset Date:chronic with exacerbation 09/21/20                Treatment Diagnosis: right shoulder pain, right knee pain, LBP   Prior Hospitalization: see medical history Provider#: [id number]   Medications: Verified on Patient summary List    Comorbidities: chronicity of condition, HTN, DM, OA, tobacco use, CVA   Prior Level of Function: mod (I) with SPC; 3step to enter 1 story home, 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   Pt will demonstrate right shoulder AROM flexion to 140degrees in order to improve ability to perform dressing               PN status: progressing = 127degrees              Current:  Met, 150 degrees  2.   Pt will demonstrate right shoulder AROM functional ER to T1 in order to improve ability to bathe                PN status: no change, laterally at UT              Current: back of head, regressed  3.   Pt will demonstrate right gross shoulder strength to at least 5-/5 in order to improve ability to lift objects shoulder height and perform heavy ADLs               PN status: Progressing = grossly 4+/5              Current: Not met, 4-/5 regressed   4.   Pt will demonstrate right knee AROM 0-120degrees in order to improve stair negotiation and walking community distances               PN status: no change = 4-101degrees             Current:  Not met, progressed/regressed to 0-96 degrees  5.   Pt will demonstrate left hip abduction to 4/5 in order to improve walking tolerance                PN status:  hip abd = 3+/5              Current: Not met, 3+/5     Assessment/Summary of care:     Patient is a 65 y.o.female who presented to PT with Right knee pain [M25.561]  Right shoulder pain [M25.511]. . Pt currently still exhibits decreased strength, ROM, decreased transfer abilities, and balance deficits. Pt has made progress with right shoulder flexion, and with knee extension. However, pt is still limited. Pt advised she would be better off completing a HEP at home. Pt was advised to return to her MD to follow up if she has any further concerns or experiences return of initial pain.  RECOMMENDATIONS:  [x] Discontinue therapy: [x] Patient has reached or is progressing toward set goals      [] Patient is non-compliant or has abdicated      [] Due to lack of appreciable progress towards set goals    Lloyd Huger, PT 01/03/2021 5:30 PM

## 2021-01-07 ENCOUNTER — Other Ambulatory Visit: Payer: Self-pay | Admitting: Internal Medicine

## 2021-01-07 DIAGNOSIS — I5042 Chronic combined systolic (congestive) and diastolic (congestive) heart failure: Secondary | ICD-10-CM

## 2021-01-08 ENCOUNTER — Encounter: Payer: PRIVATE HEALTH INSURANCE | Primary: Registered Nurse

## 2021-01-08 ENCOUNTER — Encounter: Payer: Self-pay | Admitting: Cardiology

## 2021-01-08 ENCOUNTER — Encounter

## 2021-01-16 ENCOUNTER — Inpatient Hospital Stay: Admit: 2021-01-16 | Payer: PRIVATE HEALTH INSURANCE | Primary: Registered Nurse

## 2021-01-16 DIAGNOSIS — I890 Lymphedema, not elsewhere classified: Secondary | ICD-10-CM

## 2021-01-16 NOTE — Progress Notes (Signed)
 Lymphedema EVAL/DAILY NOTE    Patient Name: [name] Short  Date:01/16/2021  DOB: 09/02/1955  [x]   Patient DOB Verified  Payor: OPTIMA / Plan: [place] OPT[place] H[place] / Product Type: HMO /    In time:16:30  Out time: 17:14  Total Treatment Time (min): 44  Total Timed Codes (min): 30  1:1 Treatment Time (MC/BCBS only): 44   Visit #: 1 of 8    Referring Provider: McKenzie, [place], MD  Next Appointment: TBD  Treatment Area: Lymphedema, not elsewhere classified [I89.0]    Pain Level (0-10 scale): 0    Personal Goal:  "Get the leg smaller"    History of Present Illness: History of chronic swelling, right>left. Has been managing with OTC compression stockings. Now seeking care after vein specialist referral after consultation with many other specialties. Works as Diplomatic Services operational officer at [employer].     Social History (DME-pump, family support):  Work situation:   [x] Currently working    Corporate treasurer to return to work   [] Disabled   [] Retired      Current or Previous Compression Garment(s):   [x] Stocking [] Sleeve  []  Pantyhose                          []  Glove/gauntlet/toe                           [] Brand:                          [] mmHg:   []  Size:     Compression Pump:  [] Yes  [x] No      Limitations/Prior level of functioning: Difficulty with functional transfers, decreased ambulatory and stair negotiation tolerance.    Subjective functional status:    Lymphedema extremity:    [] Left arm    [] Right Arm    [x] Left Leg    [x] Right Leg      Present Symptoms/History:    History of infections? No   History of leaking fluid? No   Currently on antibiotics? [] Yes [x] No      Do you take diuretics for lymphedema? [] Yes [x] No      Do you take any other drugs for lymphedema? No   Previous lymphedema treatment: No     Oncology history, if applicable: N/A      Precautions/Indications: OA, LBP, BMI >30, DM II, HTN, TIA    Diagnostic tests (imaging/bone scan/labs): Doppler, negative for DVT       OBJECTIVE:   Edema:  [] min [] moderate  [x]  severe []   Pitting    Circumferences:   Girth (cm):  Right LE  01/16/2021 Left LE     MTP: 22.4 23.4   Midfoot/navicular: 23.7 24.2   Ankle: (6 cm from floor) 23.6 26.0   Calf     (15 cm from floor) 23.8 30.0   Calf:      (25 cm from floor) 32.7 38.4   Knee:    (patella) 37.4 38.6   Thigh     (50 cm fron floor) 41.5 45.0   Thigh     (60 cm from floor) 51.0 54.0   TOTAL 256.1 279.6       Skin Integrity  Sensation: intact  Color:  hyperpigmentation  Texture:  Boggy, mildly indurated around ankle and at medial thigh  Skin integrity:  intact  Scars/Burns/incisions: N/A  Wounds: anterior and lateral aspect, scabbed, healing    Strength:  Hip Flexors = 4/5  Quadriceps = 4/5  Ankle dorsiflexors = 4/5     14 min [x] Eval                  [] Re-Eval       30 min Self Care/Home Management: Education regarding A&P of lymphatic system and associated pathology, regarding role of Complete Decongestive Therapy (CDT) in managing condition, phases of CDT including visual references/videos of compression bandaging, education on various garments, explanation/demonstration of  Manual Lymph Drainage (MLD), education on time and financial commitment of lymphedema management   Rationale: To promote patient's knowledge of condition and understanding of lymphedema treatment protocol to maximize compliance with therapy and promote independence with home management.           With   []  TE   []  TA   []  neuro   []  other: Patient Education: []  Review HEP    []  Progressed/Changed HEP based on:   []  positioning   []  body mechanics   []  transfers   []  heat/ice application    [x]  other: Self Care     Other Objective/Functional Measures:   Lymphedema Life Impact Scale = 35       Pain Level (0-10 scale) post treatment: 0    ASSESSMENT/Changes in Function: Patient is 65 year old female with c/o right lower extremity swelling below and above the knee. She is limited in activity and ADL tolerance due to discomfort in bilateral LE and is demonstrating changes in skin  texture. Patient skin with two scabbed areas present at lower leg due to collision with object causing wound. Edema  non-pitting with mild induration noted at distal lower leg as well as medial thigh just proximal to knee.. Patient with signs and symptoms consistent with bilateral lower extremity lymphedema (Stage 3). She would benefit from skilled physical therapy for complete decongestive therapy in order to reduce limb swelling, mitigate progression, promote functional mobility and ADLs, and procure necessary compression devices (bandages and garments). She will be instructed in self management of condition including self manual lymph drainage, self bandaging, donning/doffing compression garments, skin care, and remedial exercise.     Patient received an initial evaluation today followed by education as to diagnosis, precautions and treatment plan.  Patient was provided with a basic information on lymphedema, complete decongestive therapies, compression and financial obligations.    Short Term Goals: To be accomplished in 2 weeks:  1. Patient to tolerate right LE compression bandaging to ensure management of symptoms for progress towards ambulatory tolerance.  Evaluation: to be initiated    2. Patient to be independent in decongestive HEP to maximize therapeutic benefit of muscle pump with compression bandages donned.  Evaluation: to be initiated     3. Patient to demonstrate an overall 4% reduction in right LE for improved standing and walking ability.  Evaluation: baseline measurements gathered; right LE 279.6    Long Term Goals: To be accomplished in 6 weeks:  1. Patient to be fitted for necessary compression garments to ensure independent management of condition following discharge  Evaluation: to be fitted upon plateau of limb decongestion    2. Patient to verbalize 2-3 edema and skin care management strategies for risk reduction.  Evaluation: information to be initiated    3. Patient to improve LLFS score  to 28 to indicate clinically detectable subjective improvement in condition for greater QOL.  Evaluation: 35    4. Patient to demonstrate an overall 8% reduction in bil LE for  improved standing and walking ability.  Evaluation: baseline measurements gathered; right LE 279.6    Justification for Eval Code Complexity:   Patient History (low 0, mod 1-2, high 3-4): High   Examination (low 1-2, mod 3+, high 4+): mod  Clinical Presentation (low: stable/uncomplicated; mod: evolving; high: unstable/unpredictable): mod  Clinical Decision Making (low 75-100, mod 26-74, high 1-25): High           PLAN  [x]   Upgrade activities as tolerated     []   Continue plan of care  []   Update interventions per flow sheet       []   Discharge due to:_  [x]   Other:_ initiate POC      Kayla Boespflug, PT 01/16/2021  4:31 PM

## 2021-01-16 NOTE — Progress Notes (Signed)
[facility]  [address] [place] [address]  [place], [place] [address]  [phone]  [phone] fax    Plan of Care/ Statement of Necessity for Physical Therapy Services    Patient name: [name] Short Start of Care: 01/16/2021   Referral source: McKenzie, [place], MD DOB: September 24, 1955    Medical Diagnosis: Lymphedema, not elsewhere classified [I89.0]  Payor: OPTIMA / Plan: [place] OPTIMA HMO / Product Type: HMO /  Onset Date:08/17/2020    Treatment Diagnosis: Lymphedema, right lower extremity   Prior Hospitalization: see medical history Provider#: [id number]   Medications: Verified on Patient summary List    Comorbidities: OA, LBP, BMI >30, DM II, HTN, TIA   Prior Level of Function: Greater ambulatory tolerance, greater ease of stair negotiation and functional transfers.     The Plan of Care and following information is based on the information from the initial evaluation.  Assessment/ key information: Patient is 65 year old female with c/o right lower extremity swelling below and above the knee. She is limited in activity and ADL tolerance due to discomfort in bilateral LE and is demonstrating changes in skin texture. Patient skin with two scabbed areas present at lower leg due to collision with object causing wound. Edema  non-pitting with mild induration noted at distal lower leg as well as medial thigh just proximal to knee.. Patient with signs and symptoms consistent with bilateral lower extremity lymphedema (Stage 3). She would benefit from skilled physical therapy for complete decongestive therapy in order to reduce limb swelling, mitigate progression, promote functional mobility and ADLs, and procure necessary compression devices (bandages and garments). She will be instructed in self management of condition including self manual lymph drainage, self bandaging, donning/doffing compression garments, skin care, and remedial exercise.     Patient received an initial evaluation today followed by  education as to diagnosis, precautions and treatment plan.  Patient was provided with a basic information on lymphedema, complete decongestive therapies, compression and financial obligations.    Evaluation Complexity History HIGH Complexity :3+ comorbidities / personal factors will impact the outcome/ POC ; Examination MEDIUM Complexity : 3 Standardized tests and measures addressing body structure, function, activity limitation and / or participation in recreation  ;Presentation MEDIUM Complexity : Evolving with changing characteristics  ;Clinical Decision Making MEDIUM Complexity : FOTO score of 26-74  Overall Complexity Rating: HIGH   Problem List: edema affecting function, decrease ADL/ functional abilitiies and decrease activity tolerance   Treatment Plan may include any combination of the following: Therapeutic exercise, Therapeutic activities, Manual therapy, Patient education, Self Care training and Home safety training  Patient / Family readiness to learn indicated by: asking questions and interest  Persons(s) to be included in education: patient (P)  Barriers to Learning/Limitations: None  Patient Goal (s): "Get the leg smaller"  Patient Self Reported Health Status: fair  Rehabilitation Potential: good    Short Term Goals: To be accomplished in 2 weeks:  1. Patient to tolerate right LE compression bandaging to ensure management of symptoms for progress towards ambulatory tolerance.  Evaluation: to be initiated    2. Patient to be independent in decongestive HEP to maximize therapeutic benefit of muscle pump with compression bandages donned.  Evaluation: to be initiated     3. Patient to demonstrate an overall 4% reduction in right LE for improved standing and walking ability.  Evaluation: baseline measurements gathered; right LE 279.6    Long Term Goals: To be accomplished in 6  weeks:  1. Patient to be fitted for necessary compression garments to ensure independent management of condition following  discharge  Evaluation: to be fitted upon plateau of limb decongestion    2. Patient to verbalize 2-3 edema and skin care management strategies for risk reduction.  Evaluation: information to be initiated    3. Patient to improve LLFS score to 28 to indicate clinically detectable subjective improvement in condition for greater QOL.  Evaluation: 35    4. Patient to demonstrate an overall 8% reduction in bil LE for improved standing and walking ability.  Evaluation: baseline measurements gathered; right LE 279.6    Frequency / Duration: Patient to be seen 1-2 times per week for 4 weeks.    Patient/ Caregiver education and instruction: Diagnosis, prognosis, self care, activity modification and exercises   [x]   Plan of care has been reviewed with PTA    Caren Hazy, PT 01/16/2021 6:36 PM    ________________________________________________________________________    I certify that the above Therapy Services are being furnished while the patient is under my care. I agree with the treatment plan and certify that this therapy is necessary.    Physician's Signature:____________Date:_________TIME:________     McKenzie, [place], MD  ** Signature, Date and Time must be completed for valid certification **    Please sign and return to [facility]  [address] [place] [address]  [place], [place] [address]  [phone]  [phone] fax

## 2021-01-21 ENCOUNTER — Inpatient Hospital Stay: Admit: 2021-01-21 | Payer: PRIVATE HEALTH INSURANCE | Primary: Registered Nurse

## 2021-01-21 NOTE — Progress Notes (Signed)
 PHYSICAL THERAPY - DAILY TREATMENT NOTE    Patient Name: [name] Short        Date: 01/21/2021  DOB: 11/22/1955   YES Patient DOB Verified  Visit #:   2   of   8  Insurance: Payor: OPTIMA / Plan: [place] OPTIMA HMO / Product Type: HMO /      In time: 13:18 Out time: 14:13   Total Treatment Time: 55     TREATMENT AREA =  Lymphedema, not elsewhere classified [I89.0]    SUBJECTIVE    Pain Level (on 0 to 10 scale):  0  / 10   Medication Changes/New allergies or changes in medical history, any new surgeries or procedures?    NO     If yes, update Summary List   Subjective Functional Status/Changes:  [x]   No changes reported     Says her left hip has been hurting her quite a bit.           OBJECTIVE    8 min Therapeutic Exercise:  See flow sheet - decongestive exercise to reroute lymphatic fluid from bil LE via muscle joint pump action with compression bandages donne   Rationale:      decrease edema to improve the patient's ability to perform functional mobility with increased independence and safety and perform ADLs with increased ease.     37 min Manual Therapy: Multilayer compression bandaging to left LE:    - Eucerin  - stockinette  - toe wraps  - 0.4 cm soft foam x 2 ankle to groin  - 6 cm short stretch bandage (SSB) in modified sandle pattern  - 8 cm SSB herringbone ankle to popliteus  - 10 cm x 2 SSB mid-calf to groin   The manual therapy interventions were performed at a separate and distinct time from the therapeutic activities interventions.  Rationale:      increase ROM, increase tissue extensibility and decrease edema  to improve patient's ability to perform ADLs and functional mobility with improved ease and comfort    10 Min Self-Care: Bandage wear/care schedule, indications for bandage removal   Rationale:    To promote carryover of lymphedema treatment protocol and increase independence with lymphedema management.    Patient Education:  Self Care   []   Progressed/Changed HEP based on:   Patient reports compliance      Other Objective/Functional Measures:    Compression bandaging initiated    Gait antalgic on left due to hip pain, patient ambulating with straight cane     Post Treatment Pain Level (on 0 to 10) scale:   0  / 10     ASSESSMENT    Assessment/Changes in Function: Patient tolerated session well with no c/o discomfort with bandages applied. Patient and daughter (present for session) acknowledge all information presented and teach back. Daughter took video of PT performing wrapping to attempt at home.      []   See Progress Note/Recertification   Patient will continue to benefit from skilled PT services to modify and progress therapeutic interventions, address functional mobility deficits through edema reduction, procure/train in necessary compression garments, and facilitate independence in long term management.    Progress toward goals / Updated goals:  Short Term Goals: To be accomplished in 2 weeks:  1. Patient to tolerate right LE compression bandaging to ensure management of symptoms for progress towards ambulatory tolerance.  Evaluation: to be initiated  Current: 01/21/2021 - Progressing - initiated    2. Patient to be  independent in decongestive HEP to maximize therapeutic benefit of muscle pump with compression bandages donned.  Evaluation: to be initiated   Current: 01/21/2021 - Progressing - initiated    3. Patient to demonstrate an overall 4% reduction in right LE for improved standing and walking ability.  Evaluation: baseline measurements gathered; right LE 279.6    Long Term Goals: To be accomplished in 6 weeks:  1. Patient to be fitted for necessary compression garments to ensure independent management of condition following discharge  Evaluation: to be fitted upon plateau of limb decongestion    2. Patient to verbalize 2-3 edema and skin care management strategies for risk reduction.  Evaluation: information to be initiated    3. Patient to improve LLFS score to 28 to indicate clinically detectable  subjective improvement in condition for greater QOL.  Evaluation: 35    4. Patient to demonstrate an overall 8% reduction in bil LE for improved standing and walking ability.  Evaluation: baseline measurements gathered; right LE 279.6     PLAN    [x]   Upgrade activities as tolerated YES Continue plan of care   []   Discharge due to :    []   Other:      Therapist: Debria Fang, PT, PT, DPT, CLT    Date: 01/21/2021 Time: 2:19 PM     Future Appointments   Date Time Provider Department Center   01/28/2021  2:15 PM Debria Fang, PT [facility]   02/05/2021  4:45 PM Debria Fang, PT [facility]   02/08/2021  8:30 AM Debria Fang, PT [facility]   02/12/2021  4:45 PM Debria Fang, PT [facility]   02/15/2021  9:30 AM Debria Fang, PT [facility]   02/19/2021  4:45 PM Debria Fang, PT [facility]   02/22/2021  9:30 AM Debria Fang, PT [facility]   02/26/2021  2:30 PM Lilia Reges, MD [facility]   05/02/2021  1:15 PM [place], MD [facility]

## 2021-01-28 ENCOUNTER — Inpatient Hospital Stay: Admit: 2021-01-28 | Payer: PRIVATE HEALTH INSURANCE | Primary: Registered Nurse

## 2021-01-28 NOTE — Progress Notes (Signed)
 PHYSICAL THERAPY - DAILY TREATMENT NOTE    Patient Name: [name] Short        Date: 01/28/2021  DOB: May 05, 1956   YES Patient DOB Verified  Visit #:   3   of   8  Insurance: Payor: OPTIMA / Plan: [place] OPTIMA HMO / Product Type: HMO /      In time: 14:24 Out time: 15:09   Total Treatment Time: 45     Medicare/BCBS Time Tracking (below)   Total Timed Codes (min):  45 1:1 Treatment Time:  45     TREATMENT AREA =  Lymphedema, not elsewhere classified [I89.0]    SUBJECTIVE    Pain Level (on 0 to 10 scale):  0  / 10, right LE (8/10, left hip)   Medication Changes/New allergies or changes in medical history, any new surgeries or procedures?    NO     If yes, update Summary List   Subjective Functional Status/Changes:  [x]   No changes reported     Has been having significant pain in left hip but was able to wear the bandages on the right without trouble. Would like to order a second set of bandages.         OBJECTIVE      45 min Manual Therapy: Bandages taken down by PT    Skilled girth measurements.    Multilayer compression bandaging to left LE:    - Eucerin  - stockinette  - toe wraps  - 0.4 cm soft foam x 2 ankle to groin  - 6 cm short stretch bandage (SSB) in modified sandle pattern  - 8 cm SSB herringbone ankle to popliteus  - 10 cm x 2 SSB mid-calf to groin   The manual therapy interventions were performed at a separate and distinct time from the therapeutic activities interventions.  Rationale:      increase tissue extensibility and decrease edema  to improve patient's ability to perform ADLs and functional mobility with improved ease and comfort     min Patient Education:  Reviewed bandage wear and launder instructions   []   Progressed/Changed HEP based on:   Patient reports compliance     Other Objective/Functional Measures:    Girth (cm):  Right LE  01/16/2021 Left LE   Right LE  01/28/2021   MTP: 22.4 23.4 23.0   Midfoot/navicular: 23.7 24.2 22.6   Ankle: (6 cm from floor) 23.6 26.0 23.0   Calf     (15 cm from floor)  23.8 30.0 30.0   Calf:      (25 cm from floor) 32.7 38.4 33.6   Knee:    (patella) 37.4 38.6 33.5   Thigh     (50 cm fron floor) 41.5 45.0 42.0   Thigh     (60 cm from floor) 51.0 54.0 52.5   TOTAL 256.1 279.6 260.2     Right LE = -19.4 cm = 6.9% reduction     Post Treatment Pain Level (on 0 to 10) scale:   0  / 10     ASSESSMENT    Assessment/Changes in Function: Patient 9 minutes late to session. Presents to clinic with bandages donned. Demonstrating excellent response to compression therapy thus far showing 6.9% reduction in right LE edema. Second set of bandages to be ordered next session. Patient acknowledges understanding to all education provided today.      []   See Progress Note/Recertification   Patient will continue to benefit from skilled PT services to  modify and progress therapeutic interventions, address functional mobility deficits through edema reduction, procure/train in necessary compression garments, and facilitate independence in long term management   Progress toward goals / Updated goals:  Short Term Goals: To be accomplished in 2 weeks:  1. Patient to toleraterightLE compression bandaging to ensure management of symptoms for progress towards ambulatory tolerance.  Evaluation: to be initiated  Current: 01/28/2021 - MET - tolerated >72 hour wear    2. Patient to be independent in decongestive HEP to maximize therapeutic benefit of muscle pump with compression bandages donned.  Evaluation: to be initiated   Current: 01/21/2021 - Progressing - initiated    3. Patient to demonstrate an overall 4% reduction inrightLE for improved standing and walking ability.  Evaluation: baseline measurements gathered; right NW295.6  Current: 01/28/2021 - MET - 6.9% reduction    Long Term Goals: To be accomplished in 6 weeks:  1. Patient to be fitted for necessary compression garments to ensure independent management of condition following discharge  Evaluation: to be fitted upon plateau of limb  decongestion    2. Patient to verbalize 2-3 edema and skin care management strategies for risk reduction.  Evaluation: information to be initiated    3. Patient to improve LLFS score to28to indicate clinically detectable subjective improvement in condition for greater QOL.  Evaluation:35    4. Patient to demonstrate an overall 8% reduction in bil LE for improved standing and walking ability.  Evaluation: baseline measurements gathered; right LE279.6  Current: 01/28/2021 - Progressing - 6.9% reduction     PLAN    [x]   Upgrade activities as tolerated YES Continue plan of care   []   Discharge due to :    []   Other:      Therapist: Debria Fang, PT, PT, DPT, CLT    Date: 01/28/2021 Time: 2:23 PM     Future Appointments   Date Time Provider Department Center   02/05/2021  4:45 PM Debria Fang, PT [facility]   02/08/2021  8:30 AM Debria Fang, PT [facility]   02/12/2021  4:45 PM Debria Fang, PT [facility]   02/15/2021  9:30 AM Debria Fang, PT [facility]   02/19/2021  4:45 PM Debria Fang, PT [facility]   02/22/2021  9:30 AM Debria Fang, PT [facility]   02/26/2021  2:30 PM Lilia Reges, MD [facility]   05/02/2021  1:15 PM [place], MD [facility]

## 2021-02-05 ENCOUNTER — Inpatient Hospital Stay: Admit: 2021-02-05 | Payer: PRIVATE HEALTH INSURANCE | Primary: Registered Nurse

## 2021-02-05 ENCOUNTER — Encounter: Payer: Self-pay | Admitting: *Deleted

## 2021-02-05 DIAGNOSIS — I890 Lymphedema, not elsewhere classified: Secondary | ICD-10-CM

## 2021-02-05 NOTE — Progress Notes (Signed)
PHYSICAL THERAPY - DAILY TREATMENT NOTE    Patient Name: [name] Short        Date: 02/05/2021  DOB: [birth year]   YES Patient DOB Verified  Visit #:   4   of   8  Insurance: Payor: OPTIMA / Plan: [facility] [employer] H[facility] / Product Type: HMO /      In time: 16:54 Out time: 15:47   Total Treatment Time: 53     TREATMENT AREA =  Lymphedema, not elsewhere classified [I89.0]    SUBJECTIVE    Pain Level (on 0 to 10 scale):  0  / 10   Medication Changes/New allergies or changes in medical history, any new surgeries or procedures?    NO     If yes, update Summary List   Subjective Functional Status/Changes:  [x]   No changes reported     Wraps came off 45 minutes prior to appointment. Toe wraps were uncomfortable, requesting no toe wraps this date.          OBJECTIVE    53 min Manual Therapy: Manual lymph drainage sequence to reroute lymphatic fluid from right LE    Skilled girth measurements.    Multilayer compression bandaging to rightLE:    - Eucerin  - stockinette  - 0.4 cm soft foam x 2 ankle to groin  - 6 cm short stretch bandage (SSB) in modified sandle pattern  - 8 cm SSB herringbone ankle to popliteus  - 10 cm  SSB mid-calf to groin  - 12 cm SSB ankle to groin   The manual therapy interventions were performed at a separate and distinct time from the therapeutic activities interventions.  Rationale:      increase tissue extensibility and decrease edema  to improve patient's ability to perform ADLs and functional mobility with improved ease and comfort       min Patient Education:  MLD rationale   []   Progressed/Changed HEP based on:   Patient reports compliance     Other Objective/Functional Measures:     Girth (cm):  Right LE  01/16/2021 Left LE   Right LE  01/28/2021 Right LE  02/05/2021   MTP: 22.4 23.4 23.0 24.6   Midfoot/navicular: 23.7 24.2 22.6 25.0   Ankle: (6cm from floor) 23.6 26.0 23.0 26.0   Calf (15cm from floor) 23.8 30.0 30.0 28,7   Calf: (25cm from floor) 32.7 38.4 33.6 34.3   Knee: (patella) 37.4 38.6  33.5 36.0   Thigh (50cm fron floor) 41.5 45.0 42.0 41.8   Thigh (60 cm from floor) 51.0 54.0 52.5 47.0   TOTAL 256.1 279.6 260.2 263.5     Right LE = -16.2 cm = 5.8% reduction     Post Treatment Pain Level (on 0 to 10) scale:   0  / 10     ASSESSMENT    Assessment/Changes in Function: Patient 9 minutes late to session. Lower leg edema slightly increased from last session, upper leg reduced with improving skin pliability to medial aspect. Toe wraps held per patient request. Tolerated MLD without difficulty. Plan to measure for and order garments next session.      []   See Progress Note/Recertification   Patient will continue to benefit from skilled PT services to modify and progress therapeutic interventions, address functional mobility deficits through edema reduction,procure/train in necessary compression garments, and facilitate independence in long term management   Progress toward goals / Updated goals:    Short Term Goals: To be accomplished in 2 weeks:  1. Patient to toleraterightLE compression bandaging to ensure management of symptoms for progress towards ambulatory tolerance.  Evaluation: to be initiated  Current: 01/28/2021 - MET - tolerated >72 hour wear   2. Patient to be independent in decongestive HEP to maximize therapeutic benefit of muscle pump with compression bandages donned.  Evaluation: to be initiated  Current: 01/21/2021 - Progressing - initiated    3. Patient to demonstrate an overall 4% reduction inrightLE for improved standing and walking ability.  Evaluation: baseline measurements gathered; right ZO109.6  Current: 01/28/2021 - MET - 6.9% reduction    Long Term Goals: To be accomplished in 6 weeks:  1. Patient to be fitted for necessary compression garments to ensure independent management of condition following discharge  Evaluation: to be fitted upon plateau of limb decongestion    2. Patient to verbalize 2-3 edema and skin care management strategies for risk  reduction.  Evaluation: information to be initiated    3. Patient to improve LLFS score to28to indicate clinically detectable subjective improvement in condition for greater QOL.  Evaluation:35    4. Patient to demonstrate an overall 8% reduction in bil LE for improved standing and walking ability.  Evaluation: baseline measurements gathered; right LE279.6  Current: 01/28/2021 - Progressing - 6.9% reduction     PLAN    [x]   Upgrade activities as tolerated YES Continue plan of care   []   Discharge due to :    []   Other:      Therapist: Caren Hazy, PT, PT, DPT, CLT    Date: 02/05/2021 Time: 9:33 AM     Future Appointments   Date Time Provider Department Center   02/05/2021  4:45 PM Caren Hazy, PT [facility]   02/08/2021  8:30 AM Caren Hazy, PT [facility]   02/12/2021  4:45 PM Caren Hazy, PT [facility]   02/15/2021  9:30 AM Caren Hazy, PT [facility]   02/19/2021  4:45 PM Caren Hazy, PT [facility]   02/22/2021  9:30 AM Caren Hazy, PT [facility]   02/26/2021  2:30 PM Isabell Jarvis, MD [facility]   05/02/2021  1:15 PM Brooks Sailors, MD [facility]

## 2021-02-08 ENCOUNTER — Inpatient Hospital Stay: Admit: 2021-02-08 | Payer: PRIVATE HEALTH INSURANCE | Primary: Registered Nurse

## 2021-02-08 NOTE — Progress Notes (Signed)
 PHYSICAL THERAPY - DAILY TREATMENT NOTE    Patient Name: [name] Short        Date: 02/08/2021  DOB: 1956/01/07   YES Patient DOB Verified  Visit #:   5   of   8    Insurance: Payor: OPTIMA / Plan: [facility] [employer] H[facility] / Product Type: HMO /      In time: 8:30 Out time: 9:24   Total Treatment Time: 54     TREATMENT AREA =  Lymphedema, not elsewhere classified [I89.0]    SUBJECTIVE    Pain Level (on 0 to 10 scale):  0  / 10   Medication Changes/New allergies or changes in medical history, any new surgeries or procedures?    NO     If yes, update Summary List   Subjective Functional Status/Changes:  [x]   No changes reported     Wraps came off this morning.           OBJECTIVE    30 min Therapeutic Activity: Blocked practice don/doff trials using various donning aids for compression stockings, blocked practice don/doff trial of velcro compression garments   Rationale:      Facilitate compliance and long term management to improve the patient's ability to perform functional mobility with increased independence and safety and perform ADLs with increased ease.       24 min Manual Therapy: Manual lymph drainage sequence to reroute lymphatic fluid from right LE    Skilled girth measurements.    Multilayer compression bandaging to rightLE:    - Eucerin  - stockinette  - 0.4 cm soft foam x 2 ankle to groin  - 6 cm short stretch bandage (SSB) in modified sandle pattern  - 8 cm SSB herringbone ankle to popliteus  - 10 cm  SSB mid-calf to groin  - 12 cm SSB ankle to groin   The manual therapy interventions were performed at a separate and distinct time from the therapeutic activities interventions.  Rationale:      increase tissue extensibility and decrease edema  to improve patient's ability to perform ADLs and functional mobility with improved ease and comfort       min Patient Education:  Pros/cons of various garments   []   Progressed/Changed HEP based on:   Patient reports compliance     Other Objective/Functional Measures:    Min A  required for stocking donning using aide  Cueing only for velcro garments       Post Treatment Pain Level (on 0 to 10) scale:   0  / 10     ASSESSMENT    Assessment/Changes in Function: Patient trialed various garment options and donning aids. Patient choosing to proceed with velcro options for ease of don/doff and management. Plan to measure and order next session.      []   See Progress Note/Recertification   Patient will continue to benefit from skilled PT services to modify and progress therapeutic interventions, address functional mobility deficits through edema reduction,procure/train in necessary compression garments, and facilitate independence in long term management   Progress toward goals / Updated goals:  Short Term Goals: To be accomplished in 2 weeks:  1. Patient to toleraterightLE compression bandaging to ensure management of symptoms for progress towards ambulatory tolerance.  Evaluation: to be initiated  Current: 01/28/2021 - MET - tolerated >72 hour wear     2. Patient to be independent in decongestive HEP to maximize therapeutic benefit of muscle pump with compression bandages donned.  Evaluation: to  be initiated  Current: 01/21/2021 - Progressing - initiated    3. Patient to demonstrate an overall 4% reduction inrightLE for improved standing and walking ability.  Evaluation: baseline measurements gathered; right ZO109.6  Current: 01/28/2021 - MET - 6.9% reduction    Long Term Goals: To be accomplished in 6 weeks:  1. Patient to be fitted for necessary compression garments to ensure independent management of condition following discharge  Evaluation: to be fitted upon plateau of limb decongestion    2. Patient to verbalize 2-3 edema and skin care management strategies for risk reduction.  Evaluation: information to be initiated    3. Patient to improve LLFS score to28to indicate clinically detectable subjective improvement in condition for greater QOL.  Evaluation:35    4. Patient to  demonstrate an overall 8% reduction in bil LE for improved standing and walking ability.  Evaluation: baseline measurements gathered; right LE279.6  Current: 01/28/2021 - Progressing - 6.9% reduction     PLAN    [x]   Upgrade activities as tolerated YES Continue plan of care   []   Discharge due to :    []   Other:      Therapist: Debria Fang, PT, PT, DPT, CLT    Date: 02/08/2021 Time: 7:27 AM     Future Appointments   Date Time Provider Department Center   02/08/2021  8:30 AM Debria Fang, PT [facility]   02/12/2021  4:45 PM Debria Fang, PT [facility]   02/15/2021  9:30 AM Debria Fang, PT [facility]   02/19/2021  4:45 PM Debria Fang, PT [facility]   02/22/2021  9:30 AM Debria Fang, PT [facility]   02/26/2021  2:30 PM Lilia Reges, MD [facility]   05/02/2021  1:15 PM [place] Pil[place], MD [facility]

## 2021-02-12 ENCOUNTER — Inpatient Hospital Stay: Payer: PRIVATE HEALTH INSURANCE | Primary: Registered Nurse

## 2021-02-15 ENCOUNTER — Inpatient Hospital Stay: Admit: 2021-02-15 | Payer: PRIVATE HEALTH INSURANCE | Primary: Registered Nurse

## 2021-02-15 NOTE — Progress Notes (Signed)
 PHYSICAL THERAPY - DAILY TREATMENT NOTE    Patient Name: [name] Short        Date: 02/15/2021  DOB: 03/18/1956   YES Patient DOB Verified  Visit #:   6   of   8  Insurance: Payor: OPTIMA / Plan: [facility] [employer] HMO / Product Type: HMO /      In time: 09:45 Out time: 10:29   Total Treatment Time: 44     TREATMENT AREA =  Lymphedema, not elsewhere classified [I89.0]    SUBJECTIVE    Pain Level (on 0 to 10 scale):  0  / 10   Medication Changes/New allergies or changes in medical history, any new surgeries or procedures?    NO     If yes, update Summary List   Subjective Functional Status/Changes:  [x]   No changes reported     Wraps came off earlier this morning.          OBJECTIVE    8 min Therapeutic Activity:  Introduction to self MLD practice using paper copy to reroute lymphatic fluid and optimize return   Rationale:      Reduce edema to improve the patient's ability to perform functional mobility with increased independence and safety and perform ADLs with increased ease.       36 min Manual Therapy: Skilled measurement for [employer] Compreflex velcro compression garment system.    Multilayer compression bandaging torightLE:    - Eucerin  - stockinette  - 0.4 cm soft foam x 2 ankle to groin  - 6 cm short stretch bandage (SSB) in modified sandle pattern  - 8 cm SSB herringbone ankle to popliteus  - 10 cm SSB mid-calf to groin  - 12 cm SSB ankle to groin   The manual therapy interventions were performed at a separate and distinct time from the therapeutic activities interventions.  Rationale:      increase tissue extensibility and decrease edema  to improve patient's ability to perform ADLs and functional mobility with improved ease and comfort    Patient Education:  Importance of compliance with scheduled visit times for optimal outcomes- completed during manual intervention   []   Progressed/Changed HEP based on:   Patient reports compliance     Other Objective/Functional Measures:      Girth (cm):  Right LE  01/16/2021  Left LE   Right LE  01/28/2021 Right LE  02/05/2021 Right LE  02/15/2021   MTP: 22.4 23.4 23.0 24.6 24.4   Midfoot/navicular: 23.7 24.2 22.6 25.0 25.0   Ankle: (6cm from floor) 23.6 26.0 23.0 26.0 24,5   Calf (15cm from floor) 23.8 30.0 30.0 28,7 28.5   Calf: (25cm from floor) 32.7 38.4 33.6 34.3 34.0   Knee: (patella) 37.4 38.6 33.5 36.0 35.4   Thigh (50cm fron floor) 41.5 45.0 42.0 41.8 40.8   Thigh (60 cm from floor) 51.0 54.0 52.5 47.0 50.3   TOTAL 256.1 279.6 260.2 263.5 262.9     Right LE = -16.7 cm = 6% reduction     Post Treatment Pain Level (on 0 to 10) scale:   0  / 10     ASSESSMENT    Assessment/Changes in Function: Patient 15 minutes late to session, acknowledged understanding to attendance policy and importance of arriving on time for optimal outcomes during session. Patient fitted for compression garments, PT to submit order. Introduced self MLD, will review in future. Patient demonstrating excellent response to CDT showing 6 reduction of right limb edema as  well as softening of skin texture and medial thigh. States she has been compliant with HEP as method of edema management.    []   See Progress Note/Recertification   Patient will continue to benefit from skilled PT services to modify and progress therapeutic interventions, address functional mobility deficits via edema reduction and AROM/strength improvement, procure/train in necessary compression garments, and facilitate independence in long term management.    Progress toward goals / Updated goals:    Short TermGoals: To be accomplished in 2 weeks:  1. Patient to toleraterightLE compression bandaging to ensure management of symptoms for progress towards ambulatory tolerance.  Evaluation: to be initiated  Current: 01/28/2021 - MET - tolerated >72 hour wear     2. Patient to be independent in decongestive HEP to maximize therapeutic benefit of muscle pump with compression bandages donned.  Evaluation: to be  initiated  Current: 02/15/2021 - Unchanged - unable to introduce due to time constraints    3. Patient to demonstrate an overall 4% reduction inrightLE for improved standing and walking ability.  Evaluation: baseline measurements gathered; right ZO109.6  Current: 01/28/2021 - MET - 6.9% reduction    Long Term Goals: To be accomplished in 6 weeks:  1. Patient to be fitted for necessary compression garments to ensure independent management of condition following discharge  Evaluation: to be fitted upon plateau of limb decongestion  Current: 02/15/2021 - MET - fitted this date    2. Patient to verbalize 2-3 edema and skin care management strategies for risk reduction.  Evaluation: information to be initiated  Current: 02/15/2021 - Progressing - verbalizes compression and exercise.    3. Patient to improve LLFS score to28to indicate clinically detectable subjective improvement in condition for greater QOL.  Evaluation:35  Current: 02/15/2021 - To re-assess at DC    4. Patient to demonstrate an overall 8% reduction in bil LE for improved standing and walking ability.  Evaluation: baseline measurements gathered; right LE279.6  Current: Current: 02/15/2021 - 262.9 = 6% reduction     PLAN    [x]   Upgrade activities as tolerated YES Continue plan of care   []   Discharge due to :    []   Other:      Therapist: Debria Fang, PT, PT, DPT, CLT    Date: 02/15/2021 Time: 9:33 AM     Future Appointments   Date Time Provider Department Center   02/19/2021  4:45 PM Debria Fang, PT [facility]   02/22/2021  9:30 AM Debria Fang, PT [facility]   02/26/2021  2:30 PM Lilia Reges, MD [facility]   05/02/2021  1:15 PM Alcantara, David D, MD [facility]

## 2021-02-15 NOTE — Progress Notes (Signed)
 [facility]  [address]  [place], [place] [address]  [phone]  [phone] fax    Physical Therapy Progress Note  Patient name: [name] Short Start of Care: 01/16/2021   Referral source: McKenzie, [place], MD DOB: May 02, 1956                Medical Diagnosis: Lymphedema, not elsewhere classified [I89.0]  Payor: OPTIMA / Plan: [facility] OPT[employer] HMO / Product Type: HMO /  Onset Date:08/17/2020                Treatment Diagnosis: Lymphedema, right lower extremity   Prior Hospitalization: see medical history Provider#: [id number]   Medications: Verified on Patient summary List    Comorbidities: OA, LBP, BMI >30, DM II, HTN, TIA   Prior Level of Function: Greater ambulatory tolerance, greater ease of stair negotiation and functional transfers.    Visits from Start of Care: 5    Missed Visits: 1      Established Goals:        Excellent         Good         Limited            None  []  Increased ROM   []   []   []   []   []  Increased Strength  []   []   []   []   []  Increased Mobility  []   [x]   []   []    []  Decreased Pain   []   [x]   []   []   []  Decreased Swelling  []   [x]   []   []     Key Functional Changes: Reduced right LE edema, improved skin pliability    Updated Goals: to be achieved in 4 weeks:  1. Patient to be independent in decongestive HEP to maximize therapeutic benefit of muscle pump with compression bandages donned.  PN: to be initiated    2. Patient to verbalize 2-3 edema and skin care management strategies for risk reduction.  PN: verbalizes compression and exercise.    3. Patient to improve LLFS score to28to indicate clinically detectable subjective improvement in condition for greater QOL.  PN: 35 on evaluation, to re-assess at DC    4. Patient to demonstrate an overall 8% reduction in bil LE for improved standing and walking ability.  PN: 262.9 cm = 6% reduction    ASSESSMENT/RECOMMENDATIONS: Patient frequently late to sessions. acknowledged understanding to attendance policy and  importance of arriving on time for optimal outcomes during session. Patient fitted for compression garments, PT to submit order. Introduced self MLD, will review in future. Patient demonstrating excellent response to CDT showing 6% reduction of right limb edema as well as softening of skin texture and medial thigh. States she has been compliant with HEP as method of edema management.     [x] Continue therapy per initial plan/protocol at a frequency of  2 x per week for 4 weeks  [] Continue therapy with the following recommended changes:_____________________      _____________________________________________________________________  [] Discontinue therapy progressing towards or have reached established goals  [] Discontinue therapy due to lack of appreciable progress towards goals  [] Discontinue therapy due to lack of attendance or compliance  [] Await Physician's recommendations/decisions regarding therapy  [] Other:________________________________________________________________    Thank you for this referral.    Debria Fang, PT 02/15/2021 12:03 PM  NOTE TO PHYSICIAN:  PLEASE COMPLETE THE ORDERS BELOW AND   FAX TO [facility]: [phone]  If you are unable to  process this request in 24 hours please contact our office: [phone]    ? I have read the above report and request that my patient continue as recommended.  ? I have read the above report and request that my patient continue therapy with the following changes/special instructions:__________________________________________________________  ? I have read the above report and request that my patient be discharged from therapy.    Physician's signature: ______________________________Date: ______Time:______     McKenzie, [place], MD

## 2021-02-19 ENCOUNTER — Inpatient Hospital Stay: Admit: 2021-02-19 | Payer: PRIVATE HEALTH INSURANCE | Primary: Registered Nurse

## 2021-02-19 NOTE — Progress Notes (Signed)
 PHYSICAL THERAPY - DAILY TREATMENT NOTE    Patient Name: [name] Short        Date: 02/19/2021  DOB: 1956/01/07   YES Patient DOB Verified  Visit #:   1   of   8  Insurance: Payor: OPTIMA / Plan: [facility] OPT[employer] HMO / Product Type: HMO /      In time: 14:46 Out time: 17:50   Total Treatment Time: 54     TREATMENT AREA =  Lymphedema, not elsewhere classified [I89.0]    SUBJECTIVE    Pain Level (on 0 to 10 scale):  0  / 10   Medication Changes/New allergies or changes in medical history, any new surgeries or procedures?    NO     If yes, update Summary List   Subjective Functional Status/Changes:  [x]   No changes reported     OBJECTIVE      14 min Therapeutic Exercise:  Decongestive exercise to maximize lymphatic return via muscle joint pump action    -seated marches, clamshells, LAQ, ankle pumps x 2 in reverse   Rationale:      Decrease edema to improve the patient's ability to perform functional mobility with increased independence and safety and perform ADLs with increased ease.       40 min Manual Therapy: Manual lymph drainage to reroute lymphatic fluid from right LE    Multilayer compression bandaging torightLE:    - Eucerin  - stockinette  - 0.4 cm soft foam x 2 ankle to groin  - 6 cm short stretch bandage (SSB) in modified sandle pattern  - 8 cm SSB herringbone ankle to popliteus  - 10 cm SSB mid-calf to groin  - 12 cm SSB ankle to groin   The manual therapy interventions were performed at a separate and distinct time from the therapeutic activities interventions.  Rationale:      increase tissue extensibility and decrease edema  to improve patient's ability to perform ADLs and functional mobility with improved ease and comfort      Patient Education: Initiation of HEP     Other Objective/Functional Measures:    Initiated HEP         Post Treatment Pain Level (on 0 to 10) scale:   0  / 10     ASSESSMENT    Assessment/Changes in Function: Tolerated session activities without difficulty. Patient to remove bandages prior  to next session per PT instruction; acknowledges understanding.      []   See Progress Note/Recertification   Patient will continue to benefit from skilled PT services to modify and progress therapeutic interventions, address functional mobility deficits via edema reduction and AROM/strength improvement, procure/train in necessary compression garments, and facilitate independence in long term management.    Progress toward goals / Updated goals:  1. Patient to be independent in decongestive HEP to maximize therapeutic benefit of muscle pump with compression bandages donned.  PN: to be initiated  Current: 02/19/2021 - MET - initiated in clinic    2. Patient to verbalize 2-3 edema and skin care management strategies for risk reduction.  PN: verbalizes compression and exercise.    3. Patient to improve LLFS score to28to indicate clinically detectable subjective improvement in condition for greater QOL.  PN: 35 on evaluation, to re-assess at DC    4. Patient to demonstrate an overall 8% reduction in bil LE for improved standing and walking ability.  PN: 262.9 cm = 6% reduction     PLAN    [x]   Upgrade activities as tolerated YES Continue plan of care   []   Discharge due to :    []   Other:      Therapist: Debria Fang, PT, PT, DPT, CLT    Date: 02/19/2021 Time: 9:12 AM     Future Appointments   Date Time Provider Department Center   02/19/2021  4:45 PM Debria Fang, PT [facility]   02/22/2021  9:30 AM Debria Fang, PT [facility]   02/26/2021  2:30 PM Lilia Reges, MD [facility]   02/26/2021  4:45 PM Debria Fang, PT [facility]   03/05/2021  4:45 PM Debria Fang, PT [facility]   03/12/2021  4:45 PM Debria Fang, PT [facility]   03/15/2021 10:30 AM Debria Fang, PT [facility]   03/19/2021  4:45 PM Debria Fang, PT [facility]   05/02/2021  1:15 PM [place], MD [facility]

## 2021-02-22 ENCOUNTER — Inpatient Hospital Stay: Payer: PRIVATE HEALTH INSURANCE | Primary: Registered Nurse

## 2021-02-23 ENCOUNTER — Inpatient Hospital Stay
Admit: 2021-02-23 | Discharge: 2021-02-23 | Disposition: A | Discharge status: Home / Self Care | Payer: PRIVATE HEALTH INSURANCE | Attending: Emergency Medicine

## 2021-02-23 DIAGNOSIS — E11649 Type 2 diabetes mellitus with hypoglycemia without coma: Secondary | ICD-10-CM

## 2021-02-23 LAB — GLUCOSE, POC
Glucose (POC): 118 mg/dL — ABNORMAL HIGH (ref 70–110)
Glucose (POC): 156 mg/dL — ABNORMAL HIGH (ref 70–110)

## 2021-02-23 LAB — POCT GLUCOSE
POC Glucose: 118 mg/dL — ABNORMAL HIGH (ref 70–110)
POC Glucose: 156 mg/dL — ABNORMAL HIGH (ref 70–110)

## 2021-02-23 NOTE — ED Provider Notes (Signed)
 EMERGENCY DEPARTMENT HISTORY AND PHYSICAL EXAM    I have evaluated the patient at 2:06 AM      Date: 02/23/2021  Patient Name: [name] Short    History of Presenting Illness     Chief Complaint   Patient presents with    Low Blood Sugar         History Provided By: Patient  Location/Duration/Severity/Modifying factors   Patient is a 66 year old female with history of diabetes presenting to the emergency department for hypoglycemia.  Patient reports taking her normal dose of insulin  and her diabetes medications but did not eat as much as she normally does.  States that she was feeling lightheaded and knew her blood sugar was getting low.  When she checked it was 59.  She ate a small Twix candy bar and 2 crab legs.  This made her feel better.  She went to watch TV and started feeling lightheaded again.  She called out for her daughter who called EMS.  When EMS arrived, her fingerstick was 37.  She was given dextrose  in route with improvement of her sugars to 147.  She currently has no complaints.      PCP: Deatherage, Margaree Shark, NP    Current Outpatient Medications   Medication Sig Dispense Refill    BD Ultra-Fine Mini Pen Needle 31 gauge x 3/16" ndle USE TWICE A DAY      HumaLOG  Mix 75-25 KwikPen flexpen INJECT 30 UNITS BENEATH THE SKIN BEFORE BREAKFAST AND DINNER      ascorbic acid, vitamin C, (VITAMIN C) 250 mg tablet Take 1 Tab by mouth two (2) times daily (with meals). (Patient taking differently: Take 250 mg by mouth daily.) 60 Tab 0    ferrous sulfate  325 mg (65 mg iron ) tablet Take 1 Tab by mouth two (2) times daily (with meals). 60 Tab 0    pantoprazole (PROTONIX) 40 mg tablet Take 1 Tab by mouth daily. 30 Tab 0    furosemide  (LASIX ) 20 mg tablet Take 20 mg by mouth daily.      EXENATIDE MICROSPHERES (BYDUREON SC) by SubCUTAneous route every seven (7) days.      clopidogrel  (PLAVIX ) 75 mg tablet Take 1 tablet by mouth daily. 30 tablet 2    lisinopril  (PRINIVIL , ZESTRIL ) 5 mg tablet Take 1 tablet by mouth daily. 30  tablet 0       Past History     Past Medical History:  Past Medical History:   Diagnosis Date    Anemia     Arthritis     Diabetes (HCC)     History of colon polyps     HTN (hypertension)     Hyperlipemia     Menopause     Serum calcium elevated     Stroke [facility])        Past Surgical History:  Past Surgical History:   Procedure Laterality Date    COLONOSCOPY N/A 01/13/2017    COLONOSCOPY performed by Geneva Kerbs, MD at [facility]    HX CESAREAN SECTION  1987    HX COLONOSCOPY      HX DILATION AND CURETTAGE  1995       Family History:  Family History   Problem Relation Age of Onset    Hypertension Other 35        parent,NOS    Heart Disease Other 64        parent, NOS    Diabetes Other 45  parent, NOS    Cancer Father        Social History:  Social History     Tobacco Use    Smoking status: Every Day    Smokeless tobacco: Never    Tobacco comments:     patient state that she smokes 1 black&mild /daily   Vaping Use    Vaping Use: Never used   Substance Use Topics    Alcohol use: No    Drug use: No       Allergies:  No Known Allergies      Review of Systems       Review of Systems   Constitutional:  Negative for activity change, chills, diaphoresis, fatigue and fever.   Respiratory:  Negative for cough, chest tightness, shortness of breath, wheezing and stridor.    Cardiovascular:  Negative for chest pain and palpitations.   Gastrointestinal:  Negative for abdominal distention, abdominal pain, constipation, diarrhea, nausea and vomiting.   Genitourinary:  Negative for difficulty urinating, dysuria and hematuria.   Musculoskeletal:  Negative for back pain, joint swelling and myalgias.   Skin:  Negative for rash and wound.   Neurological:  Positive for light-headedness. Negative for dizziness, tremors, seizures, syncope, speech difficulty, weakness, numbness and headaches.   Psychiatric/Behavioral:  Negative for agitation. The patient is not nervous/anxious.        Physical Exam   Visit Vitals  BP (!) 154/64  (BP 1 Location: Left upper arm, BP Patient Position: At rest)   Pulse 69   Temp 97.6 F (36.4 C)   Resp 18   SpO2 100%         Physical Exam  Constitutional:       General: She is not in acute distress.     Appearance: She is not toxic-appearing.   HENT:      Head: Normocephalic and atraumatic.      Mouth/Throat:      Mouth: Mucous membranes are moist.   Eyes:      Extraocular Movements: Extraocular movements intact.      Pupils: Pupils are equal, round, and reactive to light.   Cardiovascular:      Rate and Rhythm: Normal rate and regular rhythm.      Heart sounds: Normal heart sounds. No murmur heard.    No friction rub. No gallop.   Pulmonary:      Effort: Pulmonary effort is normal.      Breath sounds: Normal breath sounds.   Abdominal:      General: There is no distension.      Palpations: Abdomen is soft. There is no mass.      Tenderness: There is no abdominal tenderness. There is no guarding.      Hernia: No hernia is present.   Musculoskeletal:         General: No swelling, tenderness or deformity.      Cervical back: Normal range of motion and neck supple.   Skin:     General: Skin is warm and dry.      Findings: No rash.   Neurological:      General: No focal deficit present.      Mental Status: She is alert and oriented to person, place, and time.   Psychiatric:         Mood and Affect: Mood normal.         Diagnostic Study Results     Labs -  Recent Results (from the past 12 hour(s))  GLUCOSE, POC    Collection Time: 02/23/21  2:13 AM   Result Value Ref Range    Glucose (POC) 118 (H) 70 - 110 mg/dL   GLUCOSE, POC    Collection Time: 02/23/21  4:35 AM   Result Value Ref Range    Glucose (POC) 156 (H) 70 - 110 mg/dL       Radiologic Studies -   No orders to display         Medical Decision Making   I am the first provider for this patient.    I reviewed the vital signs, available nursing notes, past medical history, past surgical history, family history and social history.    Vital Signs-Reviewed the  patient's vital signs.      EKG:     Records Reviewed: Nursing Notes and Old Medical Records (Time of Review: 2:06 AM)    ED Course: Progress Notes, Reevaluation, and Consults:         Provider Notes (Medical Decision Making):   MDM  Number of Diagnoses or Management Options  Hypoglycemia  Diagnosis management comments: 65 year old female presenting to the emergency department for evaluation of hypoglycemia.  Fingerstick here is 118.  She has a benign physical exam.  We will continue to monitor here and give juice and sandwich.    0441:  Repeat fingerstick is 156.  Patient remains asymptomatic hemodynamically stable.  Will be discharged at this time.  Instructed her to follow-up with her primary care doctor to adjust her medications as needed.  Return precautions advised.  Patient verbalized understanding and agreement with plan.                 Procedures    Critical Care Time:       Diagnosis     Clinical Impression:   1. Hypoglycemia        Disposition: home    Follow-up Information       Follow up With Specialties Details Why Contact Info    [facility] EMERGENCY DEPT Emergency Medicine  As needed, If symptoms worsen [address] [place]  [address]  [phone]    Deatherage, Margaree Shark, NP Nurse Practitioner Call   [address] [place] [address]  [phone]               Patient's Medications   Start Taking    No medications on file   Continue Taking    ASCORBIC ACID, VITAMIN C, (VITAMIN C) 250 MG TABLET    Take 1 Tab by mouth two (2) times daily (with meals).    BD ULTRA-FINE MINI PEN NEEDLE 31 GAUGE X 3/16" NDLE    USE TWICE A DAY    CLOPIDOGREL  (PLAVIX ) 75 MG TABLET    Take 1 tablet by mouth daily.    EXENATIDE MICROSPHERES (BYDUREON SC)    by SubCUTAneous route every seven (7) days.    FERROUS SULFATE  325 MG (65 MG IRON ) TABLET    Take 1 Tab by mouth two (2) times daily (with meals).    FUROSEMIDE  (LASIX ) 20 MG TABLET    Take 20 mg by mouth daily.    HUMALOG  MIX 75-25 KWIKPEN FLEXPEN    INJECT  30 UNITS BENEATH THE SKIN BEFORE BREAKFAST AND DINNER    LISINOPRIL  (PRINIVIL , ZESTRIL ) 5 MG TABLET    Take 1 tablet by mouth daily.    PANTOPRAZOLE (PROTONIX) 40 MG TABLET    Take 1 Tab by mouth daily.  These Medications have changed    No medications on file   Stop Taking    No medications on file     Disclaimer: Sections of this note are dictated using utilizing voice recognition software.  Minor typographical errors may be present. If questions arise, please do not hesitate to contact me or call our department.

## 2021-02-23 NOTE — ED Notes (Signed)
I have reviewed discharge instructions with the patient.  The patient verbalized understanding.

## 2021-02-23 NOTE — ED Notes (Signed)
Patient arrived via medic from home hypoglycemic. Patient bg 37 at home, given D10 IV by medic. BG 118 on arrival. Patient alert and oriented on arrival saying she is hungry. Patient provided with a tv dinner, applesauce and orange juice. Patient consumed 100% of meal.

## 2021-02-25 ENCOUNTER — Other Ambulatory Visit: Payer: Self-pay | Admitting: Internal Medicine

## 2021-02-25 ENCOUNTER — Encounter: Payer: Medicare Other | Admitting: Internal Medicine

## 2021-02-25 DIAGNOSIS — Z8639 Personal history of other endocrine, nutritional and metabolic disease: Secondary | ICD-10-CM

## 2021-02-25 NOTE — Telephone Encounter (Signed)
Empagliflozin-metFORMIN HCl 5-500 MG TABS 60 tablet   3 RF 11/06/2020  Due for a refill on 03/08/21.

## 2021-02-26 ENCOUNTER — Ambulatory Visit
Admit: 2021-02-26 | Discharge: 2021-02-26 | Payer: PRIVATE HEALTH INSURANCE | Attending: Pulmonary Disease | Primary: Registered Nurse

## 2021-02-26 ENCOUNTER — Inpatient Hospital Stay: Admit: 2021-02-26 | Payer: PRIVATE HEALTH INSURANCE | Primary: Registered Nurse

## 2021-02-26 ENCOUNTER — Ambulatory Visit: Attending: Pulmonary Disease | Primary: Registered Nurse

## 2021-02-26 DIAGNOSIS — R918 Other nonspecific abnormal finding of lung field: Secondary | ICD-10-CM

## 2021-02-26 NOTE — Progress Notes (Signed)
 HISTORY OF PRESENT ILLNESS  [name] Short is a 65 y.o. female referred for lung nodules.  Pt is a current smoker with incidental note of a lung nodule on a CT of the abdomen in April 2022. Dedicated CT done in May confirmed R lung nodule and also showed a L lung nodule, see below.  Pt is a poor historian, her daughter provides much of the history.  Pt denies shortness of breath or wheezing. Per pt's daughter, pt has had a cough with tenacious clear phlegm for years. She has no chest pain or hemoptysis.  Pt has a 34 PY smoking history and now smokes cigars in an effort to quit smoking. 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 Pt handled his work clothes.      Review of Systems   Constitutional:  Negative for chills, diaphoresis, fever, malaise/fatigue and weight loss.   HENT:  Negative for congestion, ear discharge, ear pain, hearing loss, nosebleeds, sinus pain, sore throat and tinnitus.    Eyes:  Negative for blurred vision, double vision, photophobia, pain, discharge and redness.   Respiratory:  Positive for cough and sputum production. Negative for hemoptysis, shortness of breath, wheezing and stridor.    Cardiovascular:  Positive for leg swelling (chronic). Negative for chest pain, palpitations, orthopnea, claudication and PND.   Gastrointestinal:  Negative for abdominal pain, blood in stool, constipation, diarrhea, heartburn, melena, nausea and vomiting.   Genitourinary:  Negative for dysuria, flank pain, frequency, hematuria and urgency.   Musculoskeletal:  Positive for back pain. Negative for falls, joint pain, myalgias and neck pain.   Skin:  Negative for itching and rash.   Neurological:  Negative for dizziness, tingling, tremors, sensory change, speech change, focal weakness, seizures, loss of consciousness, weakness and headaches.   Endo/Heme/Allergies:  Negative for environmental allergies and polydipsia. Bruises/bleeds easily.   Psychiatric/Behavioral:  Negative for depression,  hallucinations, memory loss, substance abuse and suicidal ideas. The patient is not nervous/anxious and does not have insomnia.    Past Medical History:   Diagnosis Date    Anemia     Arthritis     Diabetes (HCC)     History of colon polyps     HTN (hypertension)     Hyperlipemia     Menopause     Serum calcium elevated     Stroke [facility])      Past Surgical History:   Procedure Laterality Date    COLONOSCOPY N/A 01/13/2017    COLONOSCOPY performed by Geneva Kerbs, MD at [facility]    HX CESAREAN SECTION  1987    HX COLONOSCOPY      HX DILATION AND CURETTAGE  1995     Current Outpatient Medications on File Prior to Visit   Medication Sig Dispense Refill    BD Ultra-Fine Mini Pen Needle 31 gauge x 3/16" ndle USE TWICE A DAY      HumaLOG  Mix 75-25 KwikPen flexpen INJECT 30 UNITS BENEATH THE SKIN BEFORE BREAKFAST AND DINNER      ascorbic acid, vitamin C, (VITAMIN C) 250 mg tablet Take 1 Tab by mouth two (2) times daily (with meals). (Patient taking differently: Take 250 mg by mouth in the morning.) 60 Tab 0    ferrous sulfate  325 mg (65 mg iron ) tablet Take 1 Tab by mouth two (2) times daily (with meals). 60 Tab 0    pantoprazole (PROTONIX) 40 mg tablet Take 1 Tab by mouth daily. 30 Tab 0    furosemide  (LASIX )  20 mg tablet Take 20 mg by mouth daily.      EXENATIDE MICROSPHERES (BYDUREON SC) by SubCUTAneous route every seven (7) days.      clopidogrel  (PLAVIX ) 75 mg tablet Take 1 tablet by mouth daily. 30 tablet 2    lisinopril  (PRINIVIL , ZESTRIL ) 5 mg tablet Take 1 tablet by mouth daily. 30 tablet 0     No current facility-administered medications on file prior to visit.     No Known Allergies  Family History   Problem Relation Age of Onset    Lung Disease Father     Cancer Father     Hypertension Other 35        parent,NOS    Heart Disease Other 82        parent, NOS    Diabetes Other 45        parent, NOS     Social History     Socioeconomic History    Marital status: SINGLE     Spouse name: Not on file    Number  of children: Not on file    Years of education: Not on file    Highest education level: Not on file   Occupational History    Not on file   Tobacco Use    Smoking status: Not on file    Smokeless tobacco: Never    Tobacco comments:     Pt is trying to quit   Vaping Use    Vaping Use: Never used   Substance and Sexual Activity    Alcohol use: No    Drug use: No    Sexual activity: Never   Other Topics Concern    Not on file   Social History Narrative    Not on file     Social Determinants of Health     Financial Resource Strain: Not on file   Food Insecurity: Not on file   Transportation Needs: Not on file   Physical Activity: Not on file   Stress: Not on file   Social Connections: Not on file   Intimate Partner Violence: Not on file   Housing Stability: Not on file     Blood pressure (!) 141/72, pulse 84, temperature 97.7 F (36.5 C), temperature source Temporal, resp. rate 18, height 4\' 11"  (1.499 m), weight 68.9 kg (152 lb), SpO2 100 %.    Physical Exam  Constitutional:       General: She is not in acute distress.     Appearance: She is not ill-appearing or toxic-appearing.      Comments: Overweight    HENT:      Head: Normocephalic and atraumatic.      Right Ear: External ear normal.      Left Ear: External ear normal.      Nose:      Comments: Deferred      Mouth/Throat:      Comments: Deferred   Eyes:      General: No scleral icterus.        Right eye: No discharge.         Left eye: No discharge.      Conjunctiva/sclera: Conjunctivae normal.      Pupils: Pupils are equal, round, and reactive to light.   Neck:      Vascular: No carotid bruit.   Cardiovascular:      Rate and Rhythm: Normal rate and regular rhythm.      Pulses: Normal pulses.  Heart sounds: Normal heart sounds. No murmur heard.    No gallop.   Pulmonary:      Effort: Pulmonary effort is normal. No respiratory distress.      Breath sounds: Normal breath sounds. No stridor. No wheezing, rhonchi or rales.   Chest:      Chest wall: No  tenderness.   Abdominal:      Palpations: Abdomen is soft. There is no mass.      Tenderness: There is no abdominal tenderness.   Musculoskeletal:      Cervical back: No rigidity or tenderness.      Right lower leg: Edema (lynphedema) present.      Left lower leg: Edema (lymphedema) present.   Lymphadenopathy:      Cervical: No cervical adenopathy.   Skin:     Coloration: Skin is not jaundiced or pale.      Findings: No bruising or erythema.   Neurological:      General: No focal deficit present.      Mental Status: She is alert and oriented to person, place, and time.      Coordination: Coordination normal.   Psychiatric:         Mood and Affect: Mood normal.         Behavior: Behavior normal.         Thought Content: Thought content normal.         Judgment: Judgment normal.     XR Results (most recent):  Results from Hospital Encounter encounter on 09/04/20    XR HUMERUS RT    Narrative  RIGHT SHOULDER RADIOGRAPH-2 VIEWS  RIGHT HUMERUS RADIOGRAPH-2 VIEWS    INDICATION: Right upper arm/shoulder pain    COMPARISON: None    FINDINGS:  No evidence for acute fracture or dislocation right shoulder or humerus. Mild  degenerative change noted at the right glenohumeral joint and mild-to-moderate  degenerative change at the right acromioclavicular joint. Inferiorly directed  osseous spurring noted along the acromion. The right elbow joint appears grossly  intact, but not well evaluated on this exam. Visualized portion of the right  ribs appear intact. No obvious soft tissue abnormality.    Impression  1. No acute finding at the right shoulder or right humerus.  2. Degenerative findings are as described    CT Results (most recent):  Results from Hospital Encounter encounter on 12/11/20    CT CHEST W CONT    Narrative  Contrast enhanced CT of the chest.    CPT code 84696    CLINICAL HISTORY: Coin lesion. Right middle lobe nodule seen on abdominal CT  11/07/2020    TECHNIQUE: 5 mm contrast enhanced MDCT of the chest obtained.  Sagittal and  coronal reformations then created with the original data set. All CT scans at  this facility are performed using dose optimization techniques as appropriate to  a performed exam, to include automated exposure control, adjustment of the mA  and/or kV according to patient's size (including appropriate matching for site  specific examinations), or use of iterative reconstruction technique.    COMPARISON: No priors on PACS    FINDINGS:    Lungs: There is 7 mm nodule right middle lobe (series 3 image 32), stable. There  is oval 6 mm nodule left upper lobe anteriorly (21), not included on previous  PET/CT.    Airways: Patent    Pleural space: No effusions    Heart and pericardium: Mild burden coronary artery calcifications at left  coronary system  Great vessels and mediastinum: Unremarkable    Lymph nodes: No adenopathy    Base of neck: Unremarkable    Upper abdomen: Included solid organs and hollow viscera are unremarkable    MSK/body wall: Unremarkable    Impression  2 small pulmonary nodules. Recommend follow-up in 6-12 months.    [facility] SOCIETY RECOMMENDATIONS:    1.  LOW SMOKING OR OTHER EXPOSURE RISK:  6 - :  Solid solitary or multiple: CT in 6-12 months. Then consider CT in  18-24 months.    2.  HIGH SMOKING OR OTHER EXPOSURE RISK:  6 - 8mm:  Solid solitary or multiple: CT in 6-12 months. Then obtain CT in  18-24 months.    ASSESSMENT and PLAN  Encounter Diagnoses   Name Primary?    Lung nodules Yes    Current smoker     Chronic cough      Lung nodules in the setting of significant smoking history and asbestos exposure in childhood.  CT follow up to resolution or 2 year period of stability is indicated, as pt is at high risk for lung cancer.  Chronic cough may be due to chronic bronchitis. Will schedule screening spirometry.  Decision on inhaled therapy pending PFT's.  Smoking harm and cessation counseling.  Return after CT as further intervention pending test results.

## 2021-02-26 NOTE — Progress Notes (Signed)
[name] Short presents today for   Chief Complaint   Patient presents with    New Patient       Is someone accompanying this pt? yes    Is the patient using any DME equipment during OV? Cane, walker,     -DME Company n/a    Depression Screening:  3 most recent PHQ Screens 02/26/2021   Little interest or pleasure in doing things Not at all   Feeling down, depressed, irritable, or hopeless Not at all   Total Score PHQ 2 0       Learning Assessment:  No flowsheet data found.    Abuse Screening:  No flowsheet data found.    Fall Risk  No flowsheet data found.      Coordination of Care:  1. Have you been to the ER, urgent care clinic since your last visit? Hospitalized since your last visit? Yes [facility] Blood sugars    2. Have you seen or consulted any other health care providers outside of the [facility] since your last visit? Include any pap smears or colon screening. no

## 2021-02-26 NOTE — Progress Notes (Signed)
PHYSICAL THERAPY - DAILY TREATMENT NOTE    Patient Name: [name] Short        Date: 02/26/2021  DOB: 1956-01-21   YES Patient DOB Verified  Visit #:   2   of   8  Insurance: Payor: OPTIMA / Plan: [place] OPT[employer] HMO / Product Type: HMO /      In time: 16:48 Out time: 15:43   Total Treatment Time: 55       TREATMENT AREA =  Lymphedema, not elsewhere classified [I89.0]    SUBJECTIVE    Pain Level (on 0 to 10 scale):  0  / 10   Medication Changes/New allergies or changes in medical history, any new surgeries or procedures?    NO     If yes, update Summary List   Subjective Functional Status/Changes:  [x]   No changes reported     OBJECTIVE    53 min Manual Therapy:     Bandages taken down by PT.    Skilled girth measurements.    Manual lymph drainage to reroute lymphatic fluid from right LE    Multilayer compression bandaging to rightLE:    - Eucerin  - stockinette  - 0.4 cm soft foam x 2 ankle to groin  - 6 cm short stretch bandage (SSB) in modified sandle pattern  - 8 cm SSB herringbone ankle to popliteus  - 10 cm  SSB mid-calf to groin  - 12 cm SSB ankle to groin   The manual therapy interventions were performed at a separate and distinct time from the therapeutic activities interventions.  Rationale:      increase tissue extensibility and decrease edema  to improve patient's ability to perform ADLs and functional mobility with improved ease and comfort    Patient Education: Review HEP     Other Objective/Functional Measures:    Girth (cm): Right LE  01/16/2021 Left LE    Right LE  01/28/2021 Right LE  02/05/2021 Right LE  02/15/2021 Right LE  02/26/2021   MTP: 22.4 23.4 23.0 24.6 24.4 24.6   Midfoot/navicular: 23.7 24.2 22.6 25.0 25.0 23.9   Ankle: (6 cm from floor) 23.6 26.0 23.0 26.0 24,5 22.7   Calf     (15 cm from floor) 23.8 30.0 30.0 28,7 28.5 28.0   Calf:      (25 cm from floor) 32.7 38.4 33.6 34.3 34.0 33.3   Knee:    (patella) 37.4 38.6 33.5 36.0 35.4 35.9   Thigh     (50 cm fron floor) 41.5 45.0 42.0 41.8 40.8 40.4    Thigh     (60 cm from floor) 51.0 54.0 52.5 47.0 50.3 48.0   TOTAL 256.1 279.6 260.2 263.5 262.9 256.8     Right LE = -22.8 cm = 7.9% reduction     Post Treatment Pain Level (on 0 to 10) scale:   0  / 10     ASSESSMENT    Assessment/Changes in Function: Continues with excellent response to CDT and compliance with compression wear. Awaiting garment delivery for transition to garment training.    []   See Progress Note/Recertification   Patient will continue to benefit from skilled PT services to modify and progress therapeutic interventions, address functional mobility deficits via edema reduction and AROM/strength improvement, procure/train in necessary compression garments, and facilitate independence in long term management.    Progress toward goals / Updated goals:  1. Patient to be independent in decongestive HEP to maximize therapeutic benefit of muscle pump  with compression bandages donned.  PN: to be initiated   Current: 02/19/2021 - MET - initiated in clinic     2. Patient to verbalize 2-3 edema and skin care management strategies for risk reduction.  PN: verbalizes compression and exercise.     3. Patient to improve LLFS score to 28 to indicate clinically detectable subjective improvement in condition for greater QOL.  PN: 35 on evaluation, to re-assess at DC     4. Patient to demonstrate an overall 8% reduction in bil LE for improved standing and walking ability.  PN: 262.9 cm = 6% reduction  Current: 02/26/2021 - Progressing - 7.9% reduction     PLAN    [x]   Upgrade activities as tolerated YES Continue plan of care   []   Discharge due to :    []   Other:      Therapist: Delrae Alfred, PT, PT, DPT, CLT    Date: 02/26/2021 Time: 7:50 AM     Future Appointments   Date Time Provider [place]   02/26/2021  2:30 PM Amada Kingfisher, MD [facility]   02/26/2021  4:45 PM Delrae Alfred, PT [facility]   03/05/2021  4:45 PM Delrae Alfred, PT [facility]   03/12/2021  4:45 PM Delrae Alfred, PT [facility]    03/15/2021 10:30 AM Delrae Alfred, PT [facility]   03/19/2021  4:45 PM Delrae Alfred, PT [facility]   05/02/2021  1:15 PM Rollene Rotunda, MD [facility]

## 2021-02-27 ENCOUNTER — Telehealth: Payer: Self-pay

## 2021-02-27 NOTE — Telephone Encounter (Signed)
Pt's daughter requesting an order for toilet seat and shower chair. Please call back.

## 2021-02-28 NOTE — Telephone Encounter (Signed)
I returned phone call to Patients daughter Julie Elliott.Patient is in need of An Raised toilet seat and a shower chair. The daughter would like to have help with getting  incon- Supplies because she has been paying out of pocket for this. The patient has an upcoming appointment with her new PCP and we will get this addressed at this time.I will let daughter no [place], [place] C7/28/20221:46 PM

## 2021-03-05 ENCOUNTER — Inpatient Hospital Stay: Payer: PRIVATE HEALTH INSURANCE | Primary: Registered Nurse

## 2021-03-05 DIAGNOSIS — I890 Lymphedema, not elsewhere classified: Secondary | ICD-10-CM

## 2021-03-06 ENCOUNTER — Encounter: Payer: Self-pay | Admitting: Student

## 2021-03-06 ENCOUNTER — Ambulatory Visit (INDEPENDENT_AMBULATORY_CARE_PROVIDER_SITE_OTHER): Payer: Medicare Other | Admitting: Student

## 2021-03-06 ENCOUNTER — Other Ambulatory Visit: Payer: Self-pay

## 2021-03-06 VITALS — BP 123/62 | HR 76 | Temp 98.4°F | Ht 63.0 in | Wt 243.7 lb

## 2021-03-06 DIAGNOSIS — I5042 Chronic combined systolic (congestive) and diastolic (congestive) heart failure: Secondary | ICD-10-CM

## 2021-03-06 DIAGNOSIS — I5020 Unspecified systolic (congestive) heart failure: Secondary | ICD-10-CM

## 2021-03-06 DIAGNOSIS — E1142 Type 2 diabetes mellitus with diabetic polyneuropathy: Secondary | ICD-10-CM

## 2021-03-06 DIAGNOSIS — Z8639 Personal history of other endocrine, nutritional and metabolic disease: Secondary | ICD-10-CM

## 2021-03-06 DIAGNOSIS — G9520 Unspecified cord compression: Secondary | ICD-10-CM | POA: Diagnosis not present

## 2021-03-06 LAB — POCT GLYCOSYLATED HEMOGLOBIN (HGB A1C): Hemoglobin A1C: 7 % — AB (ref 4.0–5.6)

## 2021-03-06 LAB — GLUCOSE, CAPILLARY: Glucose-Capillary: 169 mg/dL — ABNORMAL HIGH (ref 70–99)

## 2021-03-06 NOTE — Patient Instructions (Signed)
Mrs. Julie Elliott it was pleasure meeting you today.   We submitted the information to get you a shower chair and an elevated toilet seat.   Regarding your diabetes, it is much improved on the [place]. We will continue this medication. Please continue to do the exercises that you have picked up from physical therapy and continue to avoid sugary foods and drinks.   Please call our office if you have any questions!

## 2021-03-07 ENCOUNTER — Encounter: Payer: Self-pay | Admitting: Student

## 2021-03-07 LAB — BMP8+ANION GAP
Anion Gap: 16 mmol/L (ref 10.0–18.0)
BUN/Creatinine Ratio: 16 (ref 12–28)
BUN: 11 mg/dL (ref 8–27)
CO2: 24 mmol/L (ref 20–29)
Calcium: 8.7 mg/dL (ref 8.7–10.3)
Chloride: 106 mmol/L (ref 96–106)
Creatinine, Ser: 0.69 mg/dL (ref 0.57–1.00)
Glucose: 171 mg/dL — ABNORMAL HIGH (ref 65–99)
Potassium: 3.5 mmol/L (ref 3.5–5.2)
Sodium: 146 mmol/L — ABNORMAL HIGH (ref 134–144)
eGFR: 97 mL/min/{1.73_m2} (ref >59)

## 2021-03-07 NOTE — Assessment & Plan Note (Signed)
Patient continues to have persistent lasix but notes that lasix has significantly reduced her lower extremity swelling. She follows regularly with Dr. Harrell Gave.  Plan: Continue current medications

## 2021-03-07 NOTE — Progress Notes (Signed)
   CC: Follow up of her diabetes   HPI:  [name] is a 65 y.o. F with PMH below who presents for follow up of her diabetes. Please see Encounters Tab for problem based charting.  Past Medical History:  Diagnosis Date   Cervical disc disease with myelopathy    CHF (congestive heart failure) (HCC)    Chronic headaches    DJD (degenerative joint disease)    DM (diabetes mellitus) ([place]) 08/04/2006   stable HgBA1C at 6.5   GERD (gastroesophageal reflux disease)    well controlled on Omeprazole   HLD (hyperlipidemia) 08/04/2006   stable, well controlled   Hypertension    well controlled   Review of Systems:  see Encounters Tab for problem based charting.  Physical Exam:  Vitals:   03/06/21 1544  BP: 123/62  Pulse: 76  Temp: 98.4 F (36.9 C)  TempSrc: Oral  SpO2: 95%  Weight: 243 lb 11.2 oz (110.5 kg)  Height: '5\' 3"'$  (1.6 m)   Gen: No acute distress CV: RRR, no murmurs or rubs or gallops Pulm: Non labored breathing on RA, CTAB, no wheezing or rales Abd: Soft, NT, ND  Ext: 2+ pitting edema of the BLE up to the knee, increased pigmentation of BLE  Neuro: BUE 4/5 strength, BLE 2-3/5  Assessment & Plan:   See Encounters Tab for problem based charting.  Patient discussed with Dr. Heber Elliott

## 2021-03-07 NOTE — Assessment & Plan Note (Signed)
Patient was last seen in our clinic 11/2020. At that time her Hgb A1c was noted to be >14. She was started on empagliflozin/metformin 5-'500mg'$  as she wished to avoid injectable medications. She reports she has also cut back on sugary drinks and foods. This visit her A1c is 7.0. Next clinic visit we will obtain a urine sample to check her microalbumin/cr ratio and will instruct patient to get an eye exam.  Plan: continue synjardy

## 2021-03-07 NOTE — Assessment & Plan Note (Signed)
Patient states that she had c-spine cord compression and underwent ACDF. She notes since that time she has had improvement in her BUE/BLE strength but her current weakness requires the use of a wheelchair. She also requires an elevated commode with handrails and a shower chair.  Plan: We will send DME to get these assistance devices ordered and patient will continue with physical therapy.

## 2021-03-08 ENCOUNTER — Encounter: Payer: PRIVATE HEALTH INSURANCE | Primary: Registered Nurse

## 2021-03-08 NOTE — Telephone Encounter (Signed)
CM sent to Julie Elliott at [facility] for elevated toilet seat with handrails and a shower chair. F2F was 03/06/21.

## 2021-03-12 ENCOUNTER — Inpatient Hospital Stay: Payer: PRIVATE HEALTH INSURANCE | Primary: Registered Nurse

## 2021-03-12 ENCOUNTER — Ambulatory Visit (INDEPENDENT_AMBULATORY_CARE_PROVIDER_SITE_OTHER): Payer: Medicare Other | Admitting: Podiatry

## 2021-03-12 ENCOUNTER — Encounter: Payer: Self-pay | Admitting: Podiatry

## 2021-03-12 ENCOUNTER — Other Ambulatory Visit: Payer: Self-pay

## 2021-03-12 DIAGNOSIS — E1142 Type 2 diabetes mellitus with diabetic polyneuropathy: Secondary | ICD-10-CM | POA: Diagnosis not present

## 2021-03-12 DIAGNOSIS — B351 Tinea unguium: Secondary | ICD-10-CM

## 2021-03-12 DIAGNOSIS — M79674 Pain in right toe(s): Secondary | ICD-10-CM | POA: Diagnosis not present

## 2021-03-12 DIAGNOSIS — L84 Corns and callosities: Secondary | ICD-10-CM | POA: Diagnosis not present

## 2021-03-12 DIAGNOSIS — M79675 Pain in left toe(s): Secondary | ICD-10-CM | POA: Diagnosis not present

## 2021-03-12 DIAGNOSIS — L853 Xerosis cutis: Secondary | ICD-10-CM

## 2021-03-12 DIAGNOSIS — B353 Tinea pedis: Secondary | ICD-10-CM | POA: Diagnosis not present

## 2021-03-12 MED ORDER — CLOTRIMAZOLE-BETAMETHASONE 1-0.05 % EX CREA: 1.0000 "application " | TOPICAL_CREAM | Freq: Every day | CUTANEOUS | 1 refills | Status: DC

## 2021-03-12 MED ORDER — AMMONIUM LACTATE 12 % EX LOTN
1.0000 | TOPICAL_LOTION | Freq: Two times a day (BID) | CUTANEOUS | 5 refills | Status: AC
Start: 2021-03-12 — End: ?

## 2021-03-12 NOTE — Telephone Encounter (Signed)
stated she tested positive last wed. should be in for friday's appt

## 2021-03-15 ENCOUNTER — Inpatient Hospital Stay: Admit: 2021-03-15 | Payer: PRIVATE HEALTH INSURANCE | Primary: Registered Nurse

## 2021-03-15 NOTE — Progress Notes (Signed)
 PHYSICAL THERAPY - DAILY TREATMENT NOTE    Patient Name: [name] Short        Date: 03/15/2021  DOB: 1955/09/24   YES Patient DOB Verified  Visit #:   4   of   8  Insurance: Payor: OPTIMA / Plan: [place] OPT[place] H[place] / Product Type: HMO /      In time: 10:38 Out time: 11:31   Total Treatment Time: 53     TREATMENT AREA =  Lymphedema, not elsewhere classified [I89.0]    SUBJECTIVE    Pain Level (on 0 to 10 scale):  0  / 10   Medication Changes/New allergies or changes in medical history, any new surgeries or procedures?    NO     If yes, update Summary List   Subjective Functional Status/Changes:  [x]   No changes reported     Had COVID last week but no adverse events. Wraps came off this morning. Patient with questions regarding longevity of treatment and prognosis for lymphedema as she is trying to stop smoking.        OBJECTIVE    13 min Therapeutic Exercise:  [x]   See flow sheet : Decongestive exercise to maximize lymphatic return via muscle joint pump action   Rationale:       Reduce edema  to improve the patient's ability to perform functional mobility with increased independence and safety and perform ADLs with increased ease.      40 min Manual Therapy: Skilled girth measurements.     Manual lymph drainage to reroute lymphatic fluid from right LE    Multilayer compression bandaging to rightLE:    - Eucerin  - stockinette  - 0.4 cm soft foam x 2 ankle to groin  - 6 cm short stretch bandage (SSB) in modified sandle pattern  - 8 cm SSB herringbone ankle to popliteus  - 10 cm  SSB mid-calf to groin  - 12 cm SSB ankle to groin   The manual therapy interventions were performed at a separate and distinct time from the therapeutic activities interventions.  Rationale:      increase tissue extensibility and decrease edema  to improve patient's ability to perform ADLs and functional mobility with improved ease and comfort      Patient Education: Review of lymphedema pathophysiology and prognosis, role of smoking cessation and  lymphedema management     Other Objective/Functional Measures:    Girth (cm): Right LE  01/16/2021 Left LE    Right LE  01/28/2021 Right LE  02/05/2021 Right LE  02/15/2021 Right LE  03/15/2021   MTP: 22.4 23.4 23.0 24.6 24.4 23.7   Midfoot/navicular: 23.7 24.2 22.6 25.0 25.0 24.8   Ankle: (6 cm from floor) 23.6 26.0 23.0 26.0 24,5 22.3   Calf     (15 cm from floor) 23.8 30.0 30.0 28,7 28.5 25.3   Calf:      (25 cm from floor) 32.7 38.4 33.6 34.3 34.0 32.3   Knee:    (patella) 37.4 38.6 33.5 36.0 35.4 35.4   Thigh     (50 cm fron floor) 41.5 45.0 42.0 41.8 40.8 39.9   Thigh     (60 cm from floor) 51.0 54.0 52.5 47.0 50.3 47.2   TOTAL 256.1 279.6 260.2 263.5 262.9 250.3      Right LE = -28.1 cm = 10.2% reduction     Post Treatment Pain Level (on 0 to 10) scale:   0  / 10  ASSESSMENT    Assessment/Changes in Function: Patient 8 minutes late to session. Continues with right LE decongestion and good skin integrity. Continuing to await delivery of compression garments. PT to send referral for pneumatic pump trial. Patient would benefit from 4 final visits to ensure reduction of limb volume and independence in long term management. Reviewed attendance policy with patient and stated she will be discharged should be cancel <24 hours or no show once more; acknowledges understanding.    []   See Progress Note/Recertification   Patient will continue to benefit from skilled PT services to modify and progress therapeutic interventions, address functional mobility deficits via edema reduction and AROM/strength improvement, procure/train in necessary compression garments, and facilitate independence in long term management.    Progress toward goals / Updated goals:    1. Patient to be independent in decongestive HEP to maximize therapeutic benefit of muscle pump with compression bandages donned.  PN: to be initiated   Current: 02/19/2021 - MET - initiated in clinic     2. Patient to verbalize 2-3 edema and skin care management  strategies for risk reduction.  PN: verbalizes compression and exercise.  Current: 03/15/2021 - MET - compression, exercise, and smoking cessation     3. Patient to improve LLFS score to 28 to indicate clinically detectable subjective improvement in condition for greater QOL.  PN: 35 on evaluation, to re-assess at DC     4. Patient to demonstrate an overall 8% reduction in bil LE for improved standing and walking ability.  PN: 262.9 cm = 6% reduction  Current: 03/15/2021 - MET - 10.2% reduction     PLAN    [x]   Upgrade activities as tolerated YES Continue plan of care   []   Discharge due to :    []   Other:      Therapist: Debria Fang, PT, PT, DPT, CLT    Date: 03/15/2021 Time: 10:39 AM     Future Appointments   Date Time Provider Department Center   03/19/2021  4:45 PM Debria Fang, PT [facility]   05/02/2021  1:15 PM [place], MD [facility]

## 2021-03-15 NOTE — Progress Notes (Signed)
 [facility]  [address]  [place], [place] [address]  [phone]  [phone] fax    Physical Therapy Progress Note  Patient name: [name] Short Start of Care: 01/16/2021   Referral source: McKenzie, [place], MD DOB: 09-02-1955                Medical Diagnosis: Lymphedema, not elsewhere classified [I89.0]  Payor: OPTIMA / Plan: [place] OPT[place] HMO / Product Type: HMO /  Onset Date:08/17/2020                Treatment Diagnosis: Lymphedema, right lower extremity   Prior Hospitalization: see medical history Provider#: [id number]   Medications: Verified on Patient summary List    Comorbidities: OA, LBP, BMI >30, DM II, HTN, TIA   Prior Level of Function: Greater ambulatory tolerance, greater ease of stair negotiation and functional transfers.    Visits from Start of Care: 9    Missed Visits: 4    Established Goals:        Excellent         Good         Limited            None  []  Increased ROM   []   []   []   []   []  Increased Strength  []   []   []   []   []  Increased Mobility  []   [x]   []   []    []  Decreased Pain   []   [x]   []   []   []  Decreased Swelling  []   [x]   []   []     Key Functional Changes: Edema reduction, garment fitting, increasing independence in HEP    Updated Goals: to be achieved in 4 weeks:  1. Patient to improve LLFS score to 28 to indicate clinically detectable subjective improvement in condition for greater QOL.  PN: 35 on evaluation, to re-assess at DC    2 Patient to demonstrate and maintain overall 10% reduction in bil LE for improved standing and walking ability.  PN: 10.2% reduction    ASSESSMENT/RECOMMENDATIONS: Continues with right LE decongestion and good skin integrity. Continuing to await delivery of compression garments. PT to send referral for pneumatic pump trial. Patient would benefit from 4 final visits to ensure reduction of limb volume and independence in long term management. Reviewed attendance policy with patient and stated she will be discharged should be  cancel <24 hours or no show once more; acknowledges understanding.     [x] Continue therapy per initial plan/protocol at a frequency of  1-2 x per week for 3 treatments  [] Continue therapy with the following recommended changes:_____________________      _____________________________________________________________________  [] Discontinue therapy progressing towards or have reached established goals  [] Discontinue therapy due to lack of appreciable progress towards goals  [] Discontinue therapy due to lack of attendance or compliance  [] Await Physician's recommendations/decisions regarding therapy  [] Other:________________________________________________________________    Thank you for this referral.    Debria Fang, PT 03/15/2021 11:25 AM  NOTE TO PHYSICIAN:  PLEASE COMPLETE THE ORDERS BELOW AND   FAX TO [facility]: [phone]  If you are unable to process this request in 24 hours please contact our office: [phone]    I have read the above report and request that my patient continue as recommended.  I have read the above report and request that my patient continue therapy with the following changes/special instructions:__________________________________________________________  I have read the above report and request  that my patient be discharged from therapy.    Physician's signature: ______________________________Date: ______Time:______     McKenzie, [place], MD

## 2021-03-17 NOTE — Progress Notes (Signed)
Subjective: Julie Elliott is a pleasant 65 y.o. female patient seen today painful thick toenails that are difficult to trim. Pain interferes with ambulation. Aggravating factors include wearing enclosed shoe gear. Pain is relieved with periodic professional debridement.  Patient is seen for at-risk foot care with h/o diabetic neuropathy. Her son, [name], is present during today's visit.  PCP is Rick Duff, MD. Last visit was: 03/06/2021.  Allergies  Allergen Reactions   Loratadine-Pseudoephedrine Er Hives    Claritin-D    Objective: Physical Exam  General: Julie Elliott is a pleasant 65 y.o. African American female, morbidly obese in NAD. AAO x 3.   Vascular:  Capillary refill time to digits immediate b/l. Palpable DP pulse(s) b/l lower extremities Palpable PT pulse(s) b/l lower extremities Pedal hair sparse. Lower extremity skin temperature gradient within normal limits. Nonpitting edema noted b/l lower extremities.  Dermatological:  Skin warm and supple b/l lower extremities. No open wounds b/l lower extremities. Interdigital maceration noted webspace(s) 1-4 b/l. No blistering, no weeping, no open wounds. Toenails 1-5 b/l elongated, discolored, dystrophic, thickened, crumbly with subungual debris and tenderness to dorsal palpation. Hyperkeratotic lesion(s) submet head 5 left foot and submet head 5 right foot.  No erythema, no edema, no drainage, no fluctuance. Diffuse scaling noted peripherally and plantarly b/l feet with mild foot odor.  No interdigital macerations.  No blisters, no weeping. No signs of secondary bacterial infection noted.  Musculoskeletal:  Normal muscle strength 5/5 to all lower extremity muscle groups bilaterally. No pain crepitus or joint limitation noted with ROM b/l lower extremities. No gross bony deformities b/l lower extremities.  Neurological:  Pt has subjective symptoms of neuropathy. Protective sensation intact 5/5 intact bilaterally with 10g monofilament  b/l. Vibratory sensation intact b/l.  Assessment and Plan:  1. Pain due to onychomycosis of toenails of both feet   2. Callus   3. Tinea pedis of both feet   4. Xerosis cutis   5. Diabetic peripheral neuropathy associated with type 2 diabetes mellitus ([place])      -Examined patient. -Patient to continue soft, supportive shoe gear daily. -Toenails 1-5 b/l were debrided in length and girth with sterile nail nippers and dremel without iatrogenic bleeding.  -Calluses pared submetatarsal head(s) 5 bilaterally utilizing sterile scalpel blade without incident.  -Patient to report any pedal injuries to medical professional immediately. -For interdigital maceration, continue antifungal spray powder between toes daily. -For tinea pedis, prescription written for Lotrisone Cream 1%/0.05% to be applied to both feet twice daily until resolved. -Rx sent to pharmacy for AmLactin Lotion 12% to be applied to feet once daily for xerosis. Start this after the Lotrisone treatment is completed. -Patient/POA to call should there be question/concern in the interim.  Return in about 3 months (around 06/12/2021).  Marzetta Board, DPM

## 2021-03-18 NOTE — Telephone Encounter (Signed)
Jones, Danielle  Gabriele Loveland L, RN; Jones, Danielle; Stenson, Melissa; Garcia, Patricia; New, Bradley; 1 other  Received! Thanks!  

## 2021-03-19 ENCOUNTER — Encounter: Payer: PRIVATE HEALTH INSURANCE | Primary: Registered Nurse

## 2021-03-26 ENCOUNTER — Inpatient Hospital Stay: Payer: PRIVATE HEALTH INSURANCE | Primary: Registered Nurse

## 2021-03-26 NOTE — Progress Notes (Signed)
[facility]  [address] [place] [address]  [place], [place] [address]  [phone]  [phone] fax    Physical Therapy Discharge Summary  Patient name: [name] Short Start of Care: 01/16/2021   Referral source: McKenzie, [place], MD DOB: 1955-10-05                Medical Diagnosis: Lymphedema, not elsewhere classified [I89.0]  Payor: OPTIMA / Plan: [place] OPTIMA HMO / Product Type: HMO /  Onset Date:08/17/2020                Treatment Diagnosis: Lymphedema, right lower extremity   Prior Hospitalization: see medical history Provider#: [id number]   Medications: Verified on Patient summary List    Comorbidities: OA, LBP, BMI >30, DM II, HTN, TIA   Prior Level of Function: Greater ambulatory tolerance, greater ease of stair negotiation and functional transfers.  Visits from Start of Care: 9    Missed Visits: 5    Reporting Period : 01/16/2021 to 03/15/2021    Summary of Care:  1. Patient to improve LLFS score to 28 to indicate clinically detectable subjective improvement in condition for greater QOL.  PN: 35 on evaluation, to re-assess at DC  DC: Unchanged - DC due to poor attendance     2 Patient to demonstrate and maintain overall 10% reduction in bil LE for improved standing and walking ability.  PN: 10.2% reduction  DC: Unchanged - DC due to poor attendance    ASSESSMENT/RECOMMENDATIONS: Patient discharged due to clinic attendance policy. At time of last visit seen, patient with excellent reduction of limb edema (10.2%). Patient has been fit for compression garments and awaiting delivery. Daughter trained in multilayer compression wrapping and able to assist patient in order to maintain results until garments are delivered.     [x] Discontinue therapy: [x] Patient has reached or is progressing toward set goals      [x] Patient is non-compliant or has abdicated      [] Due to lack of appreciable progress towards set goals    , PT 03/26/2021 4:06 PM

## 2021-03-27 ENCOUNTER — Other Ambulatory Visit: Payer: Self-pay | Admitting: Neurology

## 2021-03-27 DIAGNOSIS — M4712 Other spondylosis with myelopathy, cervical region: Secondary | ICD-10-CM

## 2021-03-29 ENCOUNTER — Inpatient Hospital Stay: Admit: 2021-03-29 | Primary: Registered Nurse

## 2021-03-29 LAB — SENTARA SPECIMEN COLLN.

## 2021-03-30 LAB — HEMOGLOBIN A1C
Estimated Avg Glucose, External: 128 mg/dL — ABNORMAL HIGH (ref 91–123)
Hemoglobin A1C, External: 6.1 % — ABNORMAL HIGH (ref 4.8–5.6)

## 2021-04-09 ENCOUNTER — Other Ambulatory Visit: Payer: Self-pay | Admitting: Neurology

## 2021-04-09 DIAGNOSIS — M4712 Other spondylosis with myelopathy, cervical region: Secondary | ICD-10-CM

## 2021-04-10 ENCOUNTER — Other Ambulatory Visit: Payer: Self-pay | Admitting: Internal Medicine

## 2021-04-10 DIAGNOSIS — M4712 Other spondylosis with myelopathy, cervical region: Secondary | ICD-10-CM

## 2021-04-11 NOTE — Telephone Encounter (Signed)
I do not know.  However, this medication has always (for 2 years on PDMP) been filled by her neurologist Dr. Jaynee Eagles.  Refill request should go to that office.  Thanks!

## 2021-05-02 ENCOUNTER — Encounter: Attending: Physical Medicine & Rehabilitation | Primary: Registered Nurse

## 2021-05-23 ENCOUNTER — Encounter: Payer: PRIVATE HEALTH INSURANCE | Attending: Physical Medicine & Rehabilitation | Primary: Registered Nurse

## 2021-05-26 ENCOUNTER — Inpatient Hospital Stay
Admit: 2021-05-26 | Discharge: 2021-05-26 | Disposition: A | Discharge status: Home / Self Care | Payer: PRIVATE HEALTH INSURANCE | Attending: Emergency Medicine

## 2021-05-26 DIAGNOSIS — N3000 Acute cystitis without hematuria: Secondary | ICD-10-CM

## 2021-05-26 LAB — URINALYSIS W/ RFLX MICROSCOPIC
Bilirubin, Urine: NEGATIVE
Bilirubin: NEGATIVE
Blood, Urine: NEGATIVE
Blood: NEGATIVE
Glucose, Ur: NEGATIVE mg/dL
Glucose: NEGATIVE mg/dL
Ketone: NEGATIVE mg/dL
Ketones, Urine: NEGATIVE mg/dL
Leukocyte Esterase, Urine: NEGATIVE
Leukocyte Esterase: NEGATIVE
Nitrite, Urine: NEGATIVE
Nitrites: NEGATIVE
Protein, UA: 30 mg/dL — AB
Protein: 30 mg/dL — AB
Specific Gravity, UA: 1.014 (ref 1.005–1.030)
Specific gravity: 1.014 (ref 1.005–1.030)
Urobilinogen, UA, POCT: 1 EU/dL (ref 0.2–1.0)
Urobilinogen: 1 EU/dL (ref 0.2–1.0)
pH (UA): 7.5 (ref 5.0–8.0)
pH, UA: 7.5 (ref 5.0–8.0)

## 2021-05-26 LAB — METABOLIC PANEL, COMPREHENSIVE
A-G Ratio: 0.9 (ref 0.8–1.7)
ALT (SGPT): 29 U/L (ref 13–56)
AST (SGOT): 28 U/L (ref 10–38)
Albumin: 3.4 g/dL (ref 3.4–5.0)
Alk. phosphatase: 101 U/L (ref 45–117)
Anion gap: 6 mmol/L (ref 3.0–18)
BUN/Creatinine ratio: 18 (ref 12–20)
BUN: 14 MG/DL (ref 7.0–18)
Bilirubin, total: 0.3 MG/DL (ref 0.2–1.0)
CO2: 29 mmol/L (ref 21–32)
Calcium: 10.8 MG/DL — ABNORMAL HIGH (ref 8.5–10.1)
Chloride: 113 mmol/L — ABNORMAL HIGH (ref 100–111)
Creatinine: 0.76 MG/DL (ref 0.6–1.3)
Globulin: 3.6 g/dL (ref 2.0–4.0)
Glucose: 53 mg/dL — CL (ref 74–99)
Potassium: 3.6 mmol/L (ref 3.5–5.5)
Protein, total: 7 g/dL (ref 6.4–8.2)
Sodium: 148 mmol/L — ABNORMAL HIGH (ref 136–145)
eGFR: >60 mL/min/{1.73_m2} (ref >60)

## 2021-05-26 LAB — URINE MICROSCOPIC ONLY
RBC, UA: NEGATIVE /HPF (ref 0–5)
RBC: NEGATIVE /hpf (ref 0–5)

## 2021-05-26 LAB — CBC WITH AUTOMATED DIFF
ABS. BASOPHILS: 0 10*3/uL (ref 0.0–0.1)
ABS. EOSINOPHILS: 0.1 10*3/uL (ref 0.0–0.4)
ABS. IMM. GRANS.: 0.1 10*3/uL — ABNORMAL HIGH (ref 0.00–0.04)
ABS. LYMPHOCYTES: 0.9 10*3/uL (ref 0.9–3.6)
ABS. MONOCYTES: 0.6 10*3/uL (ref 0.05–1.2)
ABS. NEUTROPHILS: 3.5 10*3/uL (ref 1.8–8.0)
ABSOLUTE NRBC: 0 10*3/uL (ref 0.00–0.01)
BASOPHILS: 0 % (ref 0–2)
EOSINOPHILS: 3 % (ref 0–5)
HCT: 35 % (ref 35.0–45.0)
HGB: 11.1 g/dL — ABNORMAL LOW (ref 12.0–16.0)
IMMATURE GRANULOCYTES: 1 % — ABNORMAL HIGH (ref 0.0–0.5)
LYMPHOCYTES: 17 % — ABNORMAL LOW (ref 21–52)
MCH: 25.1 PG (ref 24.0–34.0)
MCHC: 31.7 g/dL (ref 31.0–37.0)
MCV: 79.2 FL (ref 78.0–100.0)
MONOCYTES: 11 % — ABNORMAL HIGH (ref 3–10)
MPV: 10.5 FL (ref 9.2–11.8)
NEUTROPHILS: 68 % (ref 40–73)
NRBC: 0 PER 100 WBC
PLATELET: 283 10*3/uL (ref 135–420)
RBC: 4.42 M/uL (ref 4.20–5.30)
RDW: 15.1 % — ABNORMAL HIGH (ref 11.6–14.5)
WBC: 5.2 10*3/uL (ref 4.6–13.2)

## 2021-05-26 LAB — GLUCOSE, POC: Glucose (POC): 149 mg/dL — ABNORMAL HIGH (ref 70–110)

## 2021-05-26 LAB — CBC WITH AUTO DIFFERENTIAL
Basophils %: 0 % (ref 0–2)
Basophils Absolute: 0 10*3/uL (ref 0.0–0.1)
Eosinophils %: 3 % (ref 0–5)
Eosinophils Absolute: 0.1 10*3/uL (ref 0.0–0.4)
Granulocyte Absolute Count: 0.1 10*3/uL — ABNORMAL HIGH (ref 0.00–0.04)
Hematocrit: 35 % (ref 35.0–45.0)
Hemoglobin: 11.1 g/dL — ABNORMAL LOW (ref 12.0–16.0)
Immature Granulocytes %: 1 % — ABNORMAL HIGH (ref 0.0–0.5)
Lymphocytes %: 17 % — ABNORMAL LOW (ref 21–52)
Lymphocytes Absolute: 0.9 10*3/uL (ref 0.9–3.6)
MCH: 25.1 pg (ref 24.0–34.0)
MCHC: 31.7 g/dL (ref 31.0–37.0)
MCV: 79.2 FL (ref 78.0–100.0)
MPV: 10.5 FL (ref 9.2–11.8)
Monocytes %: 11 % — ABNORMAL HIGH (ref 3–10)
Monocytes Absolute: 0.6 10*3/uL (ref 0.05–1.2)
NRBC Absolute: 0 10*3/uL (ref 0.00–0.01)
Neutrophils %: 68 % (ref 40–73)
Neutrophils Absolute: 3.5 10*3/uL (ref 1.8–8.0)
Nucleated RBCs: 0 /100{WBCs}
Platelets: 283 10*3/uL (ref 135–420)
RBC: 4.42 M/uL (ref 4.20–5.30)
RDW: 15.1 % — ABNORMAL HIGH (ref 11.6–14.5)
WBC: 5.2 10*3/uL (ref 4.6–13.2)

## 2021-05-26 LAB — COMPREHENSIVE METABOLIC PANEL
ALT: 29 U/L (ref 13–56)
AST: 28 U/L (ref 10–38)
Albumin/Globulin Ratio: 0.9 (ref 0.8–1.7)
Albumin: 3.4 g/dL (ref 3.4–5.0)
Alkaline Phosphatase: 101 U/L (ref 45–117)
Anion Gap: 6 mmol/L (ref 3.0–18)
BUN/Creatinine Ratio: 18 (ref 12–20)
BUN: 14 mg/dL (ref 7.0–18)
CO2: 29 mmol/L (ref 21–32)
Calcium: 10.8 mg/dL — ABNORMAL HIGH (ref 8.5–10.1)
Chloride: 113 mmol/L — ABNORMAL HIGH (ref 100–111)
Creatinine: 0.76 mg/dL (ref 0.6–1.3)
Est, Glom Filt Rate: >60 mL/min/{1.73_m2} (ref >60)
Globulin: 3.6 g/dL (ref 2.0–4.0)
Glucose: 53 mg/dL — CL (ref 74–99)
Potassium: 3.6 mmol/L (ref 3.5–5.5)
Sodium: 148 mmol/L — ABNORMAL HIGH (ref 136–145)
Total Bilirubin: 0.3 mg/dL (ref 0.2–1.0)
Total Protein: 7 g/dL (ref 6.4–8.2)

## 2021-05-26 LAB — POCT GLUCOSE: POC Glucose: 149 mg/dL — ABNORMAL HIGH (ref 70–110)

## 2021-05-26 MED ORDER — NITROFURANTOIN MACROCRYSTAL 100 MG CAP
100 mg | ORAL_CAPSULE | Freq: Two times a day (BID) | ORAL | 0 refills | Status: AC
Start: 2021-05-26 — End: 2021-06-02

## 2021-05-26 MED ORDER — DEXTROSE 10% BOLUS IV (NEONATE)
10 % | Freq: Once | INTRAVENOUS | Status: AC
Start: 2021-05-26 — End: 2021-05-26
  Administered 2021-05-26: 08:00:00 via INTRAVENOUS

## 2021-05-26 MED ORDER — NITROFURANTOIN (25% MACROCRYSTAL FORM) 100 MG CAP
100 mg | Freq: Once | ORAL | Status: AC
Start: 2021-05-26 — End: 2021-05-26
  Administered 2021-05-26: 09:00:00 via ORAL

## 2021-05-26 MED FILL — DEXTROSE 10% IN WATER (D10W) IV: 10 % | INTRAVENOUS | Qty: 250

## 2021-05-26 MED FILL — NITROFURANTOIN (25% MACROCRYSTAL FORM) 100 MG CAP: 100 mg | ORAL | Qty: 1

## 2021-05-26 NOTE — ED Provider Notes (Signed)
 Spasms     Fatigue     S: Patient is a 65 yo female who is A&O.  She present to the ER with  with c/o left leg pain and generalized lweakness x 1 week. Reports having spasms in left leg, increased pain with movement.  Patient also have chronic weakness and peripheral edema ot her right leg.    Past Medical History:   Diagnosis Date    Anemia     Arthritis     Diabetes (HCC)     History of colon polyps     HTN (hypertension)     Hyperlipemia     Menopause     Serum calcium elevated     Stroke [facility])        Past Surgical History:   Procedure Laterality Date    COLONOSCOPY N/A 01/13/2017    COLONOSCOPY performed by Geneva Kerbs, MD at [facility]    HX CESAREAN SECTION  1987    HX COLONOSCOPY      HX DILATION AND CURETTAGE  1995         Family History:   Problem Relation Age of Onset    Lung Disease Father     Cancer Father     Hypertension Other 35        parent,NOS    Heart Disease Other 37        parent, NOS    Diabetes Other 45        parent, NOS       Social History     Socioeconomic History    Marital status: SINGLE     Spouse name: Not on file    Number of children: Not on file    Years of education: Not on file    Highest education level: Not on file   Occupational History    Not on file   Tobacco Use    Smoking status: Not on file    Smokeless tobacco: Never    Tobacco comments:     Pt is trying to quit   Vaping Use    Vaping Use: Never used   Substance and Sexual Activity    Alcohol use: No    Drug use: No    Sexual activity: Never   Other Topics Concern    Not on file   Social History Narrative    Not on file     Social Determinants of Health     Financial Resource Strain: Not on file   Food Insecurity: Not on file   Transportation Needs: Not on file   Physical Activity: Not on file   Stress: Not on file   Social Connections: Not on file   Intimate Partner Violence: Not on file   Housing Stability: Not on file         ALLERGIES: Patient has no known allergies.    Review of Systems   Constitutional:   Positive for fatigue.   HENT: Negative.     Eyes: Negative.    Respiratory: Negative.     Cardiovascular: Negative.    Gastrointestinal: Negative.    Endocrine: Negative.    Genitourinary: Negative.    Musculoskeletal:  Positive for muscle spasms.   Skin: Negative.    Allergic/Immunologic: Negative.    Neurological: Negative.    Hematological: Negative.    Psychiatric/Behavioral: Negative.     All other systems reviewed and are negative.    Vitals:    05/25/21 2358 05/26/21 0512   BP: Aaron Aas)  152/77 (!) 149/77   Pulse: 88 80   Resp: 18 16   Temp: 98.4 F (36.9 C)    SpO2: 99% 100%   Weight: 68 kg (150 lb)    Height: 5' (1.524 m)             Physical Exam  Vitals and nursing note reviewed.   Constitutional:       General: She is not in acute distress.     Appearance: She is well-developed. She is not diaphoretic.   HENT:      Head: Normocephalic.      Right Ear: External ear normal.      Left Ear: External ear normal.      Mouth/Throat:      Pharynx: No oropharyngeal exudate.   Eyes:      General: No scleral icterus.        Right eye: No discharge.         Left eye: No discharge.      Conjunctiva/sclera: Conjunctivae normal.      Pupils: Pupils are equal, round, and reactive to light.   Neck:      Thyroid: No thyromegaly.      Vascular: No JVD.      Trachea: No tracheal deviation.   Cardiovascular:      Rate and Rhythm: Normal rate and regular rhythm.      Heart sounds: Normal heart sounds. No murmur heard.    No friction rub. No gallop.   Pulmonary:      Effort: Pulmonary effort is normal. No respiratory distress.      Breath sounds: Normal breath sounds. No stridor. No wheezing or rales.   Chest:      Chest wall: No tenderness.   Abdominal:      General: Bowel sounds are normal. There is no distension.      Palpations: Abdomen is soft. There is no mass.      Tenderness: There is no abdominal tenderness. There is no guarding or rebound.   Musculoskeletal:         General: No tenderness. Normal range of motion.       Cervical back: Normal range of motion and neck supple.      Right lower leg: Edema ((has on compression a velcor compression dression for her chronic right leg lymph edema) present.      Left lower leg: Edema ((+) minimal pain with ROM, normalROM, pulses and sensory) present.   Lymphadenopathy:      Cervical: No cervical adenopathy.   Skin:     General: Skin is warm and dry.      Coloration: Skin is not pale.      Findings: No erythema or rash.   Neurological:      Mental Status: She is alert and oriented to person, place, and time.      Cranial Nerves: No cranial nerve deficit.      Motor: No abnormal muscle tone.      Coordination: Coordination normal.      Deep Tendon Reflexes: Reflexes normal.        MDM       Procedures    Torrential diagnosis: Peripheral edema, hypoglycemia, dehydration, hyponatremia    Work-up showed patient had hypoglycemia because of weakness.  Patient treated with IV dextrose .  Patient symptom resolved    Dx: hypoglycemia, peripheral edema    Disp: D/C home.  F/U PCP in 2 to 3 days  Return to Er prn.  Dictation disclaimer:  Please note that this dictation was completed with Dragon, the computer voice recognition software.  Quite often unanticipated grammatical, syntax, homophones, and other interpretive errors are inadvertently transcribed by the computer software.  Please disregard these errors.  Please excuse any errors that have escaped final proofreading.

## 2021-05-26 NOTE — ED Notes (Signed)
Patient is alert and oriented. Patient is stable at this time. Patient placed in wheelchair. Daughter will take patient to the car. I have reviewed discharge instructions with the patient.  The patient verbalized understanding.

## 2021-05-26 NOTE — ED Notes (Signed)
A&O female with c/o left leg pain and generalized weakness x 1 week. Reports having spasms in left leg, increased pain with movement.

## 2021-05-31 ENCOUNTER — Encounter

## 2021-06-04 ENCOUNTER — Encounter: Payer: PRIVATE HEALTH INSURANCE | Attending: Physical Medicine & Rehabilitation | Primary: Registered Nurse

## 2021-06-12 ENCOUNTER — Encounter

## 2021-06-13 ENCOUNTER — Inpatient Hospital Stay: Admit: 2021-06-13 | Payer: PRIVATE HEALTH INSURANCE | Attending: Registered Nurse | Primary: Registered Nurse

## 2021-06-13 DIAGNOSIS — R600 Localized edema: Secondary | ICD-10-CM

## 2021-06-18 ENCOUNTER — Other Ambulatory Visit: Payer: Self-pay

## 2021-06-18 ENCOUNTER — Ambulatory Visit (INDEPENDENT_AMBULATORY_CARE_PROVIDER_SITE_OTHER): Payer: Medicare Other | Admitting: Podiatry

## 2021-06-18 DIAGNOSIS — E1142 Type 2 diabetes mellitus with diabetic polyneuropathy: Secondary | ICD-10-CM

## 2021-06-18 DIAGNOSIS — M79674 Pain in right toe(s): Secondary | ICD-10-CM

## 2021-06-18 DIAGNOSIS — L84 Corns and callosities: Secondary | ICD-10-CM

## 2021-06-18 DIAGNOSIS — B351 Tinea unguium: Secondary | ICD-10-CM

## 2021-06-18 DIAGNOSIS — D229 Melanocytic nevi, unspecified: Secondary | ICD-10-CM

## 2021-06-18 DIAGNOSIS — M79675 Pain in left toe(s): Secondary | ICD-10-CM | POA: Diagnosis not present

## 2021-06-20 ENCOUNTER — Ambulatory Visit: Payer: PRIVATE HEALTH INSURANCE | Attending: Physical Medicine & Rehabilitation | Primary: Registered Nurse

## 2021-06-22 ENCOUNTER — Encounter: Payer: Self-pay | Admitting: Podiatry

## 2021-06-22 NOTE — Progress Notes (Signed)
  Subjective:  Patient ID: Julie Elliott, female    DOB: 1956-05-11,  MRN: [id number]  Julie Elliott presents to clinic today for at risk foot care with history of diabetic neuropathy and painful elongated mycotic toenails 1-5 bilaterally which are tender when wearing enclosed shoe gear. Pain is relieved with periodic professional debridement.  She is accompanied by her son, Julie Elliott, on today's visit. Son states her provider has been applying the Lotrisone Spray Powder between toes and AmLactin Lotion once daily.  Diabetic foot exam requirements discontinued in 2019 by M.D. due to 5/2% A1c.  PCP is Rick Duff, MD , and last visit was 03/06/2021.  Allergies  Allergen Reactions   Loratadine-Pseudoephedrine Er Hives    Claritin-D    Review of Systems: Negative except as noted in the HPI. Objective:   Constitutional Julie Elliott is a pleasant 65 y.o. African American female, obese in NAD. AAO x 3.   Vascular CFT <3 seconds b/l LE. Palpable pedal pulses b/l LE. Pedal hair sparse. No pain with calf compression RLE. Nonpitting edema noted BLE.  Neurologic Normal speech. Oriented to person, place, and time. Pt has subjective symptoms of neuropathy. Protective sensation intact 5/5 intact bilaterally with 10g monofilament b/l. Vibratory sensation intact b/l.  Dermatologic Pedal integument with normal turgor, texture and tone BLE. No open wounds b/l LE. Interdigital maceration noted b/l 4th webspace(s). No blistering, no weeping, no open wounds. Hyperkeratotic lesion(s) submet head 5 b/l.  No erythema, no edema, no drainage, no fluctuance. She does have improvement in appearance of dryness of feet. She has multiple nevi noted on both feet: medial aspect right foot, medial aspect of right 5th digit and medial aspect of hallux left foot.  Orthopedic: Normal muscle strength 5/5 to all lower extremity muscle groups bilaterally. No pain, crepitus or joint limitation noted with ROM b/l LE. No gross bony pedal  deformities b/l. Patient ambulates independently without assistive aids.   Radiographs: None Assessment:   1. Pain due to onychomycosis of toenails of both feet   2. Callus   3. Multiple nevi   4. Diabetic peripheral neuropathy associated with type 2 diabetes mellitus ([place])    Plan:  Patient was evaluated and treated and all questions answered. Consent given for treatment as described below: -Examined patient. -She has improvement in appearance of her feet. For nevi, recommend Dermatology for evaluation. Son will schedule appointment for her with his Dermatologist. -Continue diabetic foot care principles: inspect feet daily, monitor glucose as recommended by PCP and/or Endocrinologist, and follow prescribed diet per PCP, Endocrinologist and/or dietician. -Toenails 1-5 bilaterally debrided in length and girth without iatrogenic bleeding with sterile nail nipper and dremel.  -Callus(es) submet head 5 b/l pared utilizing sterile scalpel blade without complication or incident. Total number debrided =2. -Patient/POA to call should there be question/concern in the interim.  Return in about 3 months (around 09/18/2021).  Marzetta Board, DPM

## 2021-06-28 ENCOUNTER — Inpatient Hospital Stay: Payer: PRIVATE HEALTH INSURANCE | Attending: Registered Nurse | Primary: Registered Nurse

## 2021-06-28 ENCOUNTER — Inpatient Hospital Stay: Admit: 2021-06-28 | Payer: PRIVATE HEALTH INSURANCE | Attending: Registered Nurse | Primary: Registered Nurse

## 2021-06-28 DIAGNOSIS — R410 Disorientation, unspecified: Secondary | ICD-10-CM

## 2021-06-28 LAB — CREATININE, POC
Creatinine, POC: 0.8 MG/DL (ref 0.6–1.3)
eGFR (POC): >60 mL/min/{1.73_m2} (ref >60)

## 2021-06-28 LAB — AMB POC CREATININE
Creatinine, POC: 0.8 mg/dL (ref 0.6–1.3)
eGFR, POC: >60 mL/min/{1.73_m2} (ref >60)

## 2021-06-28 MED ORDER — GADOBENATE DIMEGLUMINE 529 MG/ML (0.1 MMOL/0.2 ML) IV
529 mg/mL (0.1mmol/0.2mL) | Freq: Once | INTRAVENOUS | Status: AC
Start: 2021-06-28 — End: 2021-06-28
  Administered 2021-06-28: 19:00:00 via INTRAVENOUS

## 2021-06-28 MED FILL — MULTIHANCE 529 MG/ML (0.1 MMOL/0.2 ML) INTRAVENOUS SOLUTION: 529 mg/mL (0.1mmol/0.2mL) | INTRAVENOUS | Qty: 15

## 2021-07-02 ENCOUNTER — Encounter: Attending: Physical Medicine & Rehabilitation | Primary: Registered Nurse

## 2021-07-09 ENCOUNTER — Encounter: Payer: PRIVATE HEALTH INSURANCE | Attending: Physical Medicine & Rehabilitation | Primary: Registered Nurse

## 2021-07-09 NOTE — Telephone Encounter (Signed)
Spoke with daughter. The order was placed back in July for a CT. Scheduling team at hospital called the next day to schedule for the time needed. Daughter will call them to schedule

## 2021-07-09 NOTE — Telephone Encounter (Signed)
Pt dtr would like to know the other test that dr Quitman Livings wanted to do on the pt. Please call [phone] to advise

## 2021-07-15 ENCOUNTER — Other Ambulatory Visit: Payer: Self-pay | Admitting: Internal Medicine

## 2021-07-15 DIAGNOSIS — I5042 Chronic combined systolic (congestive) and diastolic (congestive) heart failure: Secondary | ICD-10-CM

## 2021-07-16 ENCOUNTER — Encounter: Attending: Physical Medicine & Rehabilitation | Primary: Registered Nurse

## 2021-07-16 ENCOUNTER — Other Ambulatory Visit: Payer: Self-pay

## 2021-07-16 DIAGNOSIS — Z8639 Personal history of other endocrine, nutritional and metabolic disease: Secondary | ICD-10-CM

## 2021-07-16 MED ORDER — SYNJARDY 5-500 MG PO TABS: 1.0000 | ORAL_TABLET | Freq: Two times a day (BID) | ORAL | 3 refills | Status: DC

## 2021-07-22 ENCOUNTER — Inpatient Hospital Stay: Admit: 2021-07-22 | Payer: PRIVATE HEALTH INSURANCE | Attending: Pulmonary Disease | Primary: Registered Nurse

## 2021-07-22 DIAGNOSIS — R918 Other nonspecific abnormal finding of lung field: Secondary | ICD-10-CM

## 2021-08-20 ENCOUNTER — Ambulatory Visit (INDEPENDENT_AMBULATORY_CARE_PROVIDER_SITE_OTHER): Payer: Medicare Other | Admitting: Student

## 2021-08-20 VITALS — BP 110/51 | HR 76 | Temp 98.9°F | Ht 63.0 in | Wt 226.5 lb

## 2021-08-20 DIAGNOSIS — E1142 Type 2 diabetes mellitus with diabetic polyneuropathy: Secondary | ICD-10-CM | POA: Diagnosis not present

## 2021-08-20 DIAGNOSIS — I10 Essential (primary) hypertension: Secondary | ICD-10-CM | POA: Diagnosis not present

## 2021-08-20 DIAGNOSIS — G8250 Quadriplegia, unspecified: Secondary | ICD-10-CM | POA: Diagnosis not present

## 2021-08-20 LAB — POCT GLYCOSYLATED HEMOGLOBIN (HGB A1C): Hemoglobin A1C: 6.4 % — AB (ref 4.0–5.6)

## 2021-08-20 LAB — GLUCOSE, CAPILLARY: Glucose-Capillary: 114 mg/dL — ABNORMAL HIGH (ref 70–99)

## 2021-08-20 NOTE — Patient Instructions (Signed)
It was a pleasure seeing you today!   Please continue with the exercises that you were instructed to do with physical therapy.   Please also continue with what you are doing to manage your blood sugars.  We will see you in 6 months!

## 2021-08-21 LAB — MICROALBUMIN / CREATININE URINE RATIO
Creatinine, Urine: 25.4 mg/dL
Microalb/Creat Ratio: <12 mg/g creat (ref 0–29)
Microalbumin, Urine: <3 ug/mL

## 2021-08-21 NOTE — Assessment & Plan Note (Signed)
Well-controlled on metoprolol succinate 50 mg once daily, enalapril 5 mg twice daily, furosemide 40 mg twice daily, spironolactone 25 mg daily

## 2021-08-21 NOTE — Progress Notes (Signed)
° °  CC: Follow up of her diabetes   HPI:  Julie Elliott is a 66 y.o. F with PMH per below who presents for follow up of her diabetes. Please see problem based charting under encounters tab for further details.    Past Medical History:  Diagnosis Date   Cervical disc disease with myelopathy    CHF (congestive heart failure) (HCC)    Chronic headaches    DJD (degenerative joint disease)    DM (diabetes mellitus) ([place]) 08/04/2006   stable HgBA1C at 6.5   GERD (gastroesophageal reflux disease)    well controlled on Omeprazole   HLD (hyperlipidemia) 08/04/2006   stable, well controlled   Hypertension    well controlled   Review of Systems:  Please see problem based charting under encounters tab for further details.    Physical Exam:  Vitals:   08/20/21 1626  BP: (!) 110/51  Pulse: 76  Temp: 98.9 F (37.2 C)  TempSrc: Oral  SpO2: 100%  Weight: 226 lb 8 oz (102.7 kg)  Height: 5\' 3"  (1.6 m)   Constitutional: well-developed, well-nourished, and in no distress.  HENT:  Head: Normocephalic and atraumatic.  Cardiovascular: Normal rate, regular rhythm, normal heart sounds and intact distal pulses. Exam reveals no gallop and no friction rub.  No murmur heard. Pulmonary: Effort normal and breath sounds normal. No respiratory distress.  Abdominal: Soft. Bowel sounds are normal. Non tender non distended     General: No tenderness or edema.  Neurological: Limited mobility in wheel chair during visit, but able to ambulate with use of walker  Skin: Skin is warm and dry.    Assessment & Plan:   See Encounters Tab for problem based charting.  Patient discussed with Dr. Dareen Piano

## 2021-08-21 NOTE — Assessment & Plan Note (Signed)
Patient still requires use of rolling walker.  Discussed with patient possibility of resuming physical therapy.  She is currently not interested in this at this time.

## 2021-08-21 NOTE — Assessment & Plan Note (Signed)
Patient's A1c 6.4 this clinic visit.  She is currently on Synjardy 5-500 mg twice daily.  She does not have any albuminuria. She will need foot exam at next clinic visit, referred patient to ophthalmology for an eye exam. -Continue with [facility]

## 2021-08-22 NOTE — Progress Notes (Signed)
Internal Medicine Clinic Attending  Case discussed with Dr. Carter  At the time of the visit.  We reviewed the resident's history and exam and pertinent patient test results.  I agree with the assessment, diagnosis, and plan of care documented in the resident's note.  

## 2021-08-29 ENCOUNTER — Ambulatory Visit
Admit: 2021-08-29 | Discharge: 2021-08-29 | Payer: PRIVATE HEALTH INSURANCE | Attending: Pulmonary Disease | Primary: Registered Nurse

## 2021-08-29 ENCOUNTER — Ambulatory Visit
Admit: 2021-08-29 | Discharge: 2021-08-29 | Payer: PRIVATE HEALTH INSURANCE | Attending: Physical Medicine & Rehabilitation | Primary: Registered Nurse

## 2021-08-29 ENCOUNTER — Ambulatory Visit: Attending: Pulmonary Disease | Primary: Registered Nurse

## 2021-08-29 ENCOUNTER — Ambulatory Visit: Attending: Physical Medicine & Rehabilitation | Primary: Registered Nurse

## 2021-08-29 DIAGNOSIS — M5416 Radiculopathy, lumbar region: Secondary | ICD-10-CM

## 2021-08-29 DIAGNOSIS — R918 Other nonspecific abnormal finding of lung field: Secondary | ICD-10-CM

## 2021-08-29 NOTE — Progress Notes (Signed)
 HISTORY OF PRESENT ILLNESS  [name] Short is a 66 y.o. female following up for lung nodules.  Pt is a current smoker with incidental note of a lung nodule on a CT of the abdomen in April 2022. Dedicated CT done in May confirmed R lung nodule with other small nodules. Follow up CT Dec 2022 showed stable nodules, see below.    Pt denies shortness of breath or wheezing. Per pt's daughter, pt has had a cough with tenacious clear phlegm for years with some improvement since the last visit. She has no chest pain or hemoptysis.    Pt has a 34 PY smoking history and occasionally smokes cigars in an effort to quit smoking. She has occupational and family exposure to asbestos.      Review of Systems   Constitutional:  Negative for chills, diaphoresis, fever, malaise/fatigue and weight loss.   HENT:  Negative for congestion, ear discharge, ear pain, hearing loss, nosebleeds, sinus pain, sore throat and tinnitus.    Eyes:  Negative for blurred vision, double vision, photophobia, pain, discharge and redness.   Respiratory:  Negative for cough, hemoptysis, sputum production, shortness of breath, wheezing and stridor.    Cardiovascular:  Positive for leg swelling (chronic). Negative for chest pain, palpitations, orthopnea, claudication and PND.   Gastrointestinal:  Negative for abdominal pain, blood in stool, constipation, diarrhea, heartburn, melena, nausea and vomiting.   Genitourinary:  Negative for dysuria, flank pain, frequency, hematuria and urgency.   Musculoskeletal:  Positive for back pain. Negative for falls, joint pain, myalgias and neck pain.   Skin:  Negative for itching and rash.   Neurological:  Negative for dizziness, tingling, tremors, sensory change, speech change, focal weakness, seizures, loss of consciousness, weakness and headaches.   Endo/Heme/Allergies:  Negative for environmental allergies and polydipsia. Bruises/bleeds easily.   Psychiatric/Behavioral:  Negative for depression, hallucinations, memory loss,  substance abuse and suicidal ideas. The patient is not nervous/anxious and does not have insomnia.    Past Medical History:   Diagnosis Date    Anemia     Arthritis     Diabetes (HCC)     History of colon polyps     HTN (hypertension)     Hyperlipemia     Menopause     Serum calcium elevated     Stroke [facility])      Past Surgical History:   Procedure Laterality Date    COLONOSCOPY N/A 01/13/2017    COLONOSCOPY performed by Geneva Kerbs, MD at [facility]    HX CESAREAN SECTION  1987    HX COLONOSCOPY      HX DILATION AND CURETTAGE  1995     Current Outpatient Medications on File Prior to Visit   Medication Sig Dispense Refill    BD Ultra-Fine Mini Pen Needle 31 gauge x 3/16" ndle USE TWICE A DAY      HumaLOG  Mix 75-25 KwikPen flexpen 20 Units two (2) times daily (after meals).      ascorbic acid, vitamin C, (VITAMIN C) 250 mg tablet Take 1 Tab by mouth two (2) times daily (with meals). (Patient taking differently: Take 250 mg by mouth daily.) 60 Tab 0    ferrous sulfate  325 mg (65 mg iron ) tablet Take 1 Tab by mouth two (2) times daily (with meals). 60 Tab 0    furosemide  (LASIX ) 20 mg tablet Take 20 mg by mouth daily.      clopidogrel  (PLAVIX ) 75 mg tablet Take 1 tablet by mouth daily. 30  tablet 2    lisinopril  (PRINIVIL , ZESTRIL ) 5 mg tablet Take 1 tablet by mouth daily. 30 tablet 0    cyclobenzaprine (FLEXERIL) 5 mg tablet Take 5 mg by mouth two (2) times daily as needed for Muscle Spasm(s). (Patient not taking: Reported on 08/29/2021)      pantoprazole (PROTONIX) 40 mg tablet Take 1 Tab by mouth daily. (Patient not taking: Reported on 08/29/2021) 30 Tab 0    EXENATIDE MICROSPHERES (BYDUREON SC) by SubCUTAneous route every seven (7) days. (Patient not taking: Reported on 08/29/2021)       No current facility-administered medications on file prior to visit.     No Known Allergies  Family History   Problem Relation Age of Onset    Lung Disease Father     Cancer Father     Hypertension Other 35        parent,NOS     Heart Disease Other 28        parent, NOS    Diabetes Other 45        parent, NOS     Social History     Socioeconomic History    Marital status: SINGLE     Spouse name: Not on file    Number of children: Not on file    Years of education: Not on file    Highest education level: Not on file   Occupational History    Not on file   Tobacco Use    Smoking status: Every Day     Packs/day: 1.00     Years: 34.00     Pack years: 34.00     Types: Cigars, Cigarettes    Smokeless tobacco: Never    Tobacco comments:     Pt is trying to quit   Vaping Use    Vaping Use: Never used   Substance and Sexual Activity    Alcohol use: No    Drug use: No    Sexual activity: Never   Other Topics Concern    Not on file   Social History Narrative    Not on file     Social Determinants of Health     Financial Resource Strain: Not on file   Food Insecurity: Not on file   Transportation Needs: Not on file   Physical Activity: Not on file   Stress: Not on file   Social Connections: Not on file   Intimate Partner Violence: Not on file   Housing Stability: Not on file     Blood pressure (!) 180/81, pulse 74, temperature 98.2 F (36.8 C), temperature source Temporal, resp. rate 16, height 5\' 4"  (1.626 m), weight 70 kg (154 lb 6.4 oz), SpO2 100 %.    Physical Exam  Constitutional:       General: She is not in acute distress.     Appearance: She is not ill-appearing, toxic-appearing or diaphoretic.      Comments: Overweight , uses a walker   HENT:      Head: Normocephalic and atraumatic.      Right Ear: External ear normal.      Left Ear: External ear normal.      Nose: Nose normal. No congestion.      Mouth/Throat:      Mouth: Mucous membranes are moist.      Pharynx: No oropharyngeal exudate.   Eyes:      General: No scleral icterus.        Right eye: No discharge.  Left eye: No discharge.      Conjunctiva/sclera: Conjunctivae normal.      Pupils: Pupils are equal, round, and reactive to light.   Neck:      Vascular: No carotid bruit.    Cardiovascular:      Rate and Rhythm: Normal rate and regular rhythm.      Pulses: Normal pulses.      Heart sounds: Normal heart sounds. No murmur heard.    No gallop.   Pulmonary:      Effort: Pulmonary effort is normal. No respiratory distress.      Breath sounds: Normal breath sounds. No stridor. No wheezing, rhonchi or rales.   Chest:      Chest wall: No tenderness.   Abdominal:      Palpations: Abdomen is soft. There is no mass.      Tenderness: There is no abdominal tenderness.   Musculoskeletal:         General: No tenderness, deformity or signs of injury.      Cervical back: No rigidity or tenderness.      Right lower leg: Edema (lymphedema) present.      Left lower leg: Edema (lymphedema) present.   Lymphadenopathy:      Cervical: No cervical adenopathy.   Skin:     Coloration: Skin is not jaundiced or pale.      Findings: No bruising or erythema.   Neurological:      General: No focal deficit present.      Mental Status: She is alert and oriented to person, place, and time.      Coordination: Coordination normal.      Gait: Gait abnormal (antalgic).   Psychiatric:         Mood and Affect: Mood normal.         Behavior: Behavior normal.         Thought Content: Thought content normal.         Judgment: Judgment normal.     XR Results (most recent):  Results from Hospital Encounter encounter on 09/04/20    XR HUMERUS RT    Narrative  RIGHT SHOULDER RADIOGRAPH-2 VIEWS  RIGHT HUMERUS RADIOGRAPH-2 VIEWS    INDICATION: Right upper arm/shoulder pain    COMPARISON: None    FINDINGS:  No evidence for acute fracture or dislocation right shoulder or humerus. Mild  degenerative change noted at the right glenohumeral joint and mild-to-moderate  degenerative change at the right acromioclavicular joint. Inferiorly directed  osseous spurring noted along the acromion. The right elbow joint appears grossly  intact, but not well evaluated on this exam. Visualized portion of the right  ribs appear intact. No obvious soft  tissue abnormality.    Impression  1. No acute finding at the right shoulder or right humerus.  2. Degenerative findings are as described    CT Results (most recent):  Results from Hospital Encounter encounter on 07/22/21    CT CHEST WO CONT    Narrative  EXAM: CT CHEST WITHOUT CONTRAST.    CLINICAL HISTORY/INDICATION:  Follow-up lung nodules    COMPARISON: CT chest 12/11/2020.    TECHNIQUE: Standard helical images were obtained from the thoracic inlet through  the adrenals at mm thick sections without intravenous contrast.  Coronal and sagittal reformations obtained. Images were reviewed on both soft  tissue, lung, and bone window settings.    All CT scans at this facility are performed using dose optimization technique as  appropriate to a performed exam, to  include automated exposure control,  adjustment of the mA and/or kV according to patient's size (including  appropriate matching for site-specific examinations), or use of iterative  reconstruction technique.    FINDINGS:    Right upper lobe-  3 mm oval solid pulmonary nodule image 20 stable.    Right middle lobe-  7 mm oval solid pulmonary nodule right middle lobe stable.    Left upper lobe-irregular 6 x 9 mm pulmonary nodule, image 21 series 3 unchanged    No new nodules.    There is no evidence of mediastinal, hilar, nor axillary adenopathy.    Evaluation of the mediastinum and hila is limited by the lack of IV contrast.  The great vessels and thoracic aorta are unremarkable.    There are no pleural effusions.    The included portion of the of the liver is unremarkable.    The adrenal glands are normal.    The chest wall soft tissues are unremarkable.    The bony structures are unremarkable.    Impression  6 month stability of 3 pulmonary nodules the largest measuring 7 mm. [facility]LEISCH[facility]  SOCIETY RECOMMENDATIONS:  B. 6 TO 8 MM  Multiple Solid:  Low Risk: consider CT in 18 to 24 months  High Risk: obtain in 18 to 24 months    ASSESSMENT and PLAN  Encounter  Diagnoses   Name Primary?    Lung nodules Yes    Current smoker     Exposure to asbestos      Lung nodules in the setting of significant smoking history and asbestos exposure in childhood.  CT follow up to resolution or 2 year period of stability is indicated, as pt with risk factor for lung cancer (asbestos exposure and smoking).  Cough has resolved in the interim. Should it recur will send for spirometry to screen for COPD.   Smoking harm and cessation counseling. Pt sets end of 2023 as a goal to quit.  Schedule follow up CT in Dec 2023 and return visit after.  Flu vaccine given by PCP.

## 2021-08-29 NOTE — Progress Notes (Signed)
 Progress Notes by Lake Pilgrim, MD at 08/29/21 1330                Author: Geet Hosking D, MD  Service: --  Author Type: Physician       Filed: 08/30/21 1310  Encounter Date: 08/29/2021  Status: Signed          Editor: Lake Pilgrim, MD (Physician)                       [facility]  [address], [address]   [place], [place] [address]   Phone: [phone]   Fax: [phone]          [name] Short, [name] Short   DOB: 11/28/1955   PCP: Venice Gillis, NP   08/29/2021      PROGRESS NOTE      HISTORY OF PRESENT ILLNESS  [name] Short is a 66 y.o. female who was seen as a new patient on 11/22/20 c/o chronic low back, right leg, and right shoulder pain. She reports edema and  heaviness of the RLE. Her LLE occasionally gives out on her.  Pt notes that she had a stroke affecting the right side, but her leg was not heavy at the time. She sees Dr. Randalyn Bushman for her right knee  and shoulder pains. 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 Her claim was denied by [facility] so she did not proceed with treatment.  During 12/20/20 OV, she mentioned that she saw her endocrinologist, and she was told her calcium was too high because of an issue with her parathyroid. She continued to have low back pain and heaviness  in the RLE. Her back pain was worse with prolonged sitting, so she had to get up and move/stretch. She was attending PT and doing stretches. She  also had h/o stroke, but she noted that her leg did not feel heavy until more recently. Lumbar spine MRI dated 12/11/20 reviewed. Per report,  Right-sided disease, with posterior displacement of right S1 nerve root and compression of right exiting L5 nerve root. Left-sided disease, central stenosis and additional findings as described.      [name] Short was seen today for follow up. She continues with lower back pain radiating into RLE with weakness. Pt was not interested in injection treatment or  surgical consult at this time, but was interested in managing condition through HEP and exercise.  Pt's family notes that pt sees Neurology through a private practice.      Pain Score: 3/10       Treatments patient has tried:   Physical therapy: yes   Doing HEP: yes   Non-opioid medications: Yes   Spinal injections: No   Spinal surgery- No.    Last Lumbar Spine MRI: 12/2020       PmHx: h/o stroke (2014) affecting the right side; DM; constipation         ASSESSMENT   [name] Short is a 66 y.o. female with chronic low back pain radiating into RLE with weakness. Her symptoms may be due to a right L5 radiculopathy (T11-12 severe bilateral foraminal stenosis when combined  with facet and ligamentous hypertrophy) vs lumbar spinal stenosis (compression of exiting right L5; severe central stenosis L4-5) based on presentation and radiologic evidence.        We discussed treatment options of: right L5 ESI, surgical consult, neuromodulator medications, HEP. Pt is not interested in  surgical consult or injections at this time.           PLAN   1.  We provided and discussed exercises/stretches for pt to add to her HEP. Advised pt to maintain consistent physician-guided HEP.   2.  Advised pt to call if she is interested in any of the discussed treatment options      Pt will f/u PRN.        Diagnoses and all orders for this visit:      1. 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 H/O: stroke      4. Lumbar facet arthropathy      5. Lumbar pain                  PAST MEDICAL HISTORY      Past Medical History:        Diagnosis  Date         ?  Anemia       ?  Arthritis       ?  Diabetes (HCC)       ?  History of colon polyps       ?  HTN (hypertension)       ?  Hyperlipemia       ?  Menopause       ?  Serum calcium elevated           ?  Stroke [facility])               Past Surgical History:         Procedure  Laterality  Date          ?  COLONOSCOPY  N/A  01/13/2017          COLONOSCOPY  performed by Geneva Kerbs, MD at [facility]          ?  HX CESAREAN SECTION    1987     ?  HX COLONOSCOPY              ?  HX DILATION AND CURETTAGE    1995           MEDICATIONS         Current Outpatient Medications          Medication  Sig  Dispense  Refill           ?  BD Ultra-Fine Mini Pen Needle 31 gauge x 3/16" ndle  USE TWICE A DAY         ?  HumaLOG  Mix 75-25 KwikPen flexpen  20 Units two (2) times daily (after meals).         ?  ascorbic acid, vitamin C, (VITAMIN C) 250 mg tablet  Take 1 Tab by mouth two (2) times daily (with meals). (Patient taking differently: Take 250 mg by mouth daily.)  60 Tab  0     ?  ferrous sulfate  325 mg (65 mg iron ) tablet  Take 1 Tab by mouth two (2) times daily (with meals).  60 Tab  0     ?  furosemide  (LASIX ) 20 mg tablet  Take 20 mg by mouth daily.         ?  clopidogrel  (PLAVIX ) 75 mg tablet  Take 1 tablet by mouth daily.  30 tablet  2     ?  lisinopril  (PRINIVIL , ZESTRIL ) 5 mg tablet  Take 1 tablet by mouth daily.  30 tablet  0     ?  cyclobenzaprine (FLEXERIL) 5 mg tablet  Take 5 mg by mouth two (2) times daily as needed for Muscle Spasm(s). (Patient not taking: No sig reported)         ?  pantoprazole (PROTONIX) 40 mg tablet  Take 1 Tab by mouth daily. (Patient not taking: No sig reported)  30 Tab  0           ?  EXENATIDE MICROSPHERES (BYDUREON SC)  by SubCUTAneous route every seven (7) days. (Patient not taking: No sig reported)               ALLERGIES     No Known Allergies         SOCIAL HISTORY       Social History          Socioeconomic History         ?  Marital status:  SINGLE       Tobacco Use         ?  Smoking status:  Every Day              Packs/day:  1.00         Years:  34.00         Pack years:  34.00         Types:  Cigars, Cigarettes         ?  Smokeless tobacco:  Never        ?  Tobacco comments:             Pt is trying to quit       Vaping Use         ?  Vaping Use:  Never used       Substance and Sexual Activity         ?  Alcohol use:  No      ?  Drug use:  No         ?  Sexual activity:  Never         FAMILY HISTORY       Family History         Problem  Relation  Age of Onset          ?  Lung Disease  Father       ?  Cancer  Father       ?  Hypertension  Other  35              parent,NOS          ?  Heart Disease  Other  62              parent, NOS          ?  Diabetes  Other  45              parent, NOS           REVIEW OF SYSTEMS  Review  of Systems    Constitutional:  Negative for chills, fever and weight loss.    Respiratory:  Negative for shortness of breath.     Cardiovascular:  Negative for chest pain.    Gastrointestinal:  Negative for constipation.         Negative for fecal incontinence    Genitourinary:  Negative for dysuria.         Negative for urinary incontinence  Musculoskeletal:  Positive for back pain (lower).    Skin:  Negative for rash.    Neurological:  Negative for dizziness, tingling, tremors, focal weakness and headaches.    Endo/Heme/Allergies:  Does not bruise/bleed easily.    Psychiatric/Behavioral:  The patient does not have insomnia.         PHYSICAL EXAMINATION   Visit Vitals      Pulse  82     Temp  98.3 F (36.8 C) (Temporal)     Resp  16     Ht  5\' 4"  (1.626 m)     Wt  154 lb (69.9 kg)     SpO2  100%        BMI  26.43 kg/m              Pain Assessment   08/29/2021        Location of Pain  Back     Location Modifiers  -     Severity of Pain  3     Quality of Pain  Dull;Aching;Throbbing;Sharp     Quality of Pain Comment  depends on activity     Duration of Pain  Other (Comment)     Frequency of Pain  Intermittent     Aggravating Factors  Other (Comment);Walking;Standing     Aggravating Factors Comment  lifting     Limiting Behavior  Yes     Relieving Factors  Exercises;Heat     Relieving Factors Comment  -     Result of Injury  No     Work-Related Injury  -        Type of Injury  -           Constitutional:  Well developed, well nourished, in no acute distress.    Psychiatric: Affect and mood are appropriate.     HEENT: Normocephalic, atraumatic. Extraocular movements intact.   Integumentary : No rashes or abrasions noted on exposed areas.     Cardiovascular: Regular rate and rhythm.    Pulmonary: Clear to auscultation bilaterally.     SPINE/MUSCULOSKELETAL EXAM      Cervical spine:   Neck is midline.    Normal muscle tone.    No focal atrophy is noted.    ROM pain free.    Shoulder ROM intact.    Negative Spurling's sign.    Negative Tinel's sign.    Positive Hoffman's sign on the right.                                                                                                                               Sensation in the bilateral arms grossly intact to light touch.        Lumbar spine:   No rash, ecchymosis, or gross obliquity.    No fasciculations.    No focal atrophy is noted.    No pain with hip ROM.    Full range of motion.  No tenderness to palpation at the sciatic notch.    SI joints non-tender.    Trochanters non tender.       Sensation in the bilateral legs grossly intact to light touch.       Edema in RLE      Updates 08/29/21:   Edema in BLE, she wears compression stockings to OV      MOTOR:                Elbow Flex   Elbow Ext  Arm Abd  Wrist Ext  Wrist Flex  Hand Intrin             Right  5/5  5/5  5/5  5/5  5/5  5/5             Left  5/5  5/5  5/5  5/5  5/5  5/5                                        Hip flex   Knee Ext  EHL  Ankle DF  Ankle PF                  Right  5/5  5/5  5/5  5/5  5/5               Left  5/5  5/5  5/5  5/5  5/5          DTRs are 2+ biceps, triceps, brachioradialis, patella, and Achilles.      Negative Straight Leg raise.    Squat not tested.    No difficulty with tandem gait.       Ambulation with Rollator walker. FWB.     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 Mild scoliosis, right   convexity. Mild straightening of the lumbar lordosis with mild retrolisthesis of   the fused  L4-L5 segment. No focal compression deformity. Schmorl's formation,   discogenic endplate edema at E4-V4. No pathologic marrow signal. Conus   medullaris ends at L2 with normal morphology and signal intensity       Sagittal images show posterior disc bulge with mild cord contact at T10-T11 and   T11-T12. No cord compression. Severe bilateral foraminal stenosis when combined   with facet and ligamentous hypertrophy.       T12-L1: Right posterior lateral disc protrusion/foraminal disc protrusion. No   central stenosis or cord contact. Patent left foramen. Moderately severe right   foraminal stenosis.       L1-L2: No disc herniation or central stenosis. Facet and ligamentous hypertrophy   with mild to moderate foraminal stenosis.       L2-L3: Mild posterior disc bulge. Prominent posterior epidural fat. AP canal   measures 9.3 mm, mild central stenosis. Facet and ligamentous hypertrophy with   moderate bilateral foraminal stenosis.       L3-L4: Posterior disc bulge. Severe facet and ligamentous hypertrophy. Facet   joint inflammation and trace joint effusion. Edema in the right L4 pedicle.   Overall, AP canal measures 6 mm, moderately severe to severe central stenosis.   Moderately severe foraminal stenosis with suspected mild compression of left   exiting L3 nerve root.       L4-L5: Posterior disc bulge and endplate spondylosis. Severe facet and  ligamentous hypertrophy. Severe central stenosis. Severe left and moderate right   foraminal stenosis. Compression of left exiting L4 nerve root. Mild compression   of right exiting L4 nerve root also possible.       L5-S1: Right posterior lateral disc protrusion and endplate spondylosis.   Displacement of the right descending S1 nerve root. Moderate central stenosis.   Facet and ligamentous hypertrophy with mild inflammation. Severe right and   moderate left foraminal stenosis. Compression of right exiting L5 nerve root.       IMPRESSION   1. Right-sided disease, with  posterior displacement of right S1 nerve root and   compression of right exiting L5 nerve root.   2. Left-sided disease, central stenosis and additional findings as described.     2V (AP/LAT) Lumbar Spine XR images taken on 11/22/20 personally reviewed with patient:   Pelvic obliquity - left side higher than right   Reversal of lumbar lordosis.   Severe disc space narrowing at L4-5, L5-S1   No obvious compression fractures or instabilities         10 minutes of face-to-face contact were spent with the patient during today's visit extensively discussing symptoms and treatment plan.  All questions were answered. More than half of this visit today was spent on counseling.        Written by Rebekah Canada, [employer], as dictated by Dr. Jennett Model.

## 2021-08-29 NOTE — Progress Notes (Signed)
Melenie Minniear presents today for   Chief Complaint   Patient presents with    Lung Nodule    Follow-up    Results     CT done 07/22/21       Is someone accompanying this pt? Daughter    Is the patient using any DME equipment during OV? No    -DME Company NA    Depression Screening:  3 most recent PHQ Screens 02/26/2021   Little interest or pleasure in doing things Not at all   Feeling down, depressed, irritable, or hopeless Not at all   Total Score PHQ 2 0       Learning Assessment:  No flowsheet data found.    Abuse Screening:  No flowsheet data found.    Fall Risk  No flowsheet data found.      Coordination of Care:  1. Have you been to the ER, urgent care clinic since your last visit? Hospitalized since your last visit? No    2. Have you seen or consulted any other health care providers outside of the [facility] since your last visit? Include any pap smears or colon screening. PCP

## 2021-09-04 ENCOUNTER — Encounter

## 2021-09-08 ENCOUNTER — Encounter

## 2021-09-20 NOTE — Telephone Encounter (Signed)
 Called patient [phone] and verified her name and date of birth. I informed her the provider was out of the office until Tuesday. I would recommend she go to the nearest urgent care or to the local ER if her pain is that bad. I can offer her an appointment for next week but if she is feeling that much in pain. I do not want to have her wait until then, I inquired where her pain was. She stated it is in both legs but it is worse in her rle. Patient what was close by her. I informed her that I am not sure but it could be a [employer] or a [facility] or she could have her daughter google the nearest urgent care to her. Patient verbalized understanding.        Patient last HIPPA form was done on 12/13/2018 and it is blank. It may need to be updated if she would like for us  to speak to her daughter.

## 2021-09-20 NOTE — Telephone Encounter (Signed)
 Patient daughter [name] Short stating that patient is getting worst and they will be getting the form on Monday to disclose her MRI so that they can have them sent over having spasms in her leg and her pain level is at a 10. Patient is requesting a call back as well as patient daughter.        [phone]

## 2021-09-23 ENCOUNTER — Other Ambulatory Visit: Payer: Self-pay

## 2021-09-23 ENCOUNTER — Encounter: Payer: Self-pay | Admitting: Podiatry

## 2021-09-23 ENCOUNTER — Ambulatory Visit (INDEPENDENT_AMBULATORY_CARE_PROVIDER_SITE_OTHER): Payer: Medicare Other | Admitting: Podiatry

## 2021-09-23 DIAGNOSIS — G959 Disease of spinal cord, unspecified: Secondary | ICD-10-CM

## 2021-09-23 DIAGNOSIS — E1142 Type 2 diabetes mellitus with diabetic polyneuropathy: Secondary | ICD-10-CM | POA: Diagnosis not present

## 2021-09-23 DIAGNOSIS — M79675 Pain in left toe(s): Secondary | ICD-10-CM

## 2021-09-23 DIAGNOSIS — B351 Tinea unguium: Secondary | ICD-10-CM

## 2021-09-23 DIAGNOSIS — M79674 Pain in right toe(s): Secondary | ICD-10-CM

## 2021-09-23 DIAGNOSIS — L84 Corns and callosities: Secondary | ICD-10-CM

## 2021-09-24 ENCOUNTER — Telehealth
Admit: 2021-09-24 | Discharge: 2021-09-24 | Payer: PRIVATE HEALTH INSURANCE | Attending: Physical Medicine & Rehabilitation | Primary: Registered Nurse

## 2021-09-24 DIAGNOSIS — G959 Disease of spinal cord, unspecified: Secondary | ICD-10-CM

## 2021-09-24 NOTE — Progress Notes (Addendum)
 [facility]  [address], [address]  [place], [place] [address]  Phone: [phone]  Fax: [phone]        Short, [name]  DOB: Mar 07, 1956  PCP: Venice Gillis, APRN - NP  09/24/2021     PROGRESS NOTE     Consent: [name] was evaluated through a patient-initiated, synchronous (real-time) audio only encounter. She (or guardian if applicable) is aware that it is a billable service, which includes applicable co-pays, with coverage as determined by her insurance carrier. This visit was conducted with the patient's (and/or legan guardian's) verbal consent. She has not had a related appointment within my department in the past 7 days or scheduled within the next 24 hours. The patient was located in a state where the provider was licensed to provide care.     The patient was located at Home: [address]  [place] [address]  Provider was located at [employer] (Appt Dept): [address]  [address]  [place],  [place] [address]     The visit was conducted in the office with the patient being in home through a telephone platform.  Visit video time start: 4:39 PM  end: 5:00 PM    HISTORY OF PRESENT ILLNESS  [name] Short is a 66 y.o. female who was seen as a new patient on 11/22/20 c/o chronic low back, right leg, and right shoulder pain. She reports edema and  heaviness of the RLE. Her LLE occasionally gives out on her.  Pt notes that she had a stroke affecting the right side, but her leg was not heavy at the time. She sees Dr. Randalyn Bushman for her right knee  and shoulder pains. 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 Her claim was denied by [facility] so she did not proceed with treatment.  During 12/20/20 OV, she mentioned that she saw her endocrinologist, and she was told her calcium  was too high because of an issue with her parathyroid. She continued to have low back pain and heaviness  in the RLE. Her back pain was worse with  prolonged sitting, so she had to get up and move/stretch. She was attending PT and doing stretches. She  also had h/o stroke, but she noted that her leg did not feel heavy until more recently. Lumbar spine MRI dated 12/11/20 reviewed. Per report,  Right-sided disease, with posterior displacement of right S1 nerve root and compression of right exiting L5 nerve root. Left-sided disease, central stenosis and additional findings as described. She continued  with lower back pain radiating into RLE with weakness. Pt was not interested in injection treatment or surgical consult at this time, but was interested in managing condition through HEP and exercise.  Pt's family noted that pt sees Neurology through a private practice.    [name] was seen virtually today for follow up. She continues with lower back pain. She is consistent with Gabapentin regimen. 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 Pt notes of muscle spasms of her right leg, where the leg will jump up, for the last 2 months. Pt's daughter notes that pt has also been experiencing a right foot drop since 05/2021. She also has shaking of her legs when she  puts pressure on them. Pt's daughter notes that pt is also dropping things from her hand.    Pain Scale: 4/10     PmHx:h/o stroke (2014) affecting the right side; DM; constipation    PMP reviewed.     REVIEW OF SYSTEMS  Review of Systems   Constitutional:  Negative for fever and unexpected weight change.   HENT:  Negative for trouble swallowing.    Eyes:  Negative for visual disturbance.   Respiratory:  Negative for shortness of breath and wheezing.    Gastrointestinal:  Negative for nausea and vomiting.   Genitourinary:  Negative for enuresis.   Musculoskeletal:  Positive  for back pain and neck pain.   Neurological:  Positive for dizziness and weakness. Negative for headaches.   Psychiatric/Behavioral:  Negative for sleep disturbance. The patient is not nervous/anxious.    All other systems reviewed and are negative.      ASSESSMENT  [name] Short is seen virtually today with hx of chronic low back pain radiating into RLE with weakness. 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 I recommended surgical consult for pt. Pt has signs of cervical myelopathy, including decreased balance, hand weakness, and possible spasticity in her legs. Of note, Lumbar MRI reveals right lumbar radiculopathy (T11-12 severe bilateral foraminal stenosis when combined  with facet and ligamentous hypertrophy) and lumbar spinal stenosis (compression of exiting right L5; severe central stenosis L4-5).       PLAN  Referral to Orthopedic Surgery - surgical consult for cervical myelopathy. Advised pt to bring Cervical MRI disk. Pt had Cervical MRI performed by [facility] in 09/2021  We may consider Baclofen  for treatment option if she continues to suffer from spasticity in the future. Due to pt's DM, we did not rx prednisone steroid pack.    Pt will f/u after surgical consult or sooner if needed.      [name] was seen today for back pain.    Diagnoses and all orders for this visit:    Cervical myelopathy (HCC)    Spasticity    Lumbar pain    Cervical pain             PAST MEDICAL HISTORY   Past Medical History:   Diagnosis Date    Anemia     Arthritis     Diabetes (HCC)     History of colon polyps     HTN (hypertension)     Hyperlipemia     Menopause     Serum calcium  elevated     Stroke [facility])        Past Surgical History:   Procedure Laterality Date    CESAREAN SECTION  1987    COLONOSCOPY N/A 01/13/2017    COLONOSCOPY performed by Geneva Kerbs,  MD at [facility]    COLONOSCOPY      DILATION AND CURETTAGE OF UTERUS  1995       MEDICATIONS      Current Outpatient Medications   Medication Sig Dispense Refill    ascorbic acid (VITAMIN C) 250 MG tablet Take 250 mg by mouth daily      clopidogrel  (PLAVIX ) 75 MG tablet Take 1 tablet by mouth daily      Continuous Blood Gluc Sensor (FREESTYLE LIBRE 3 SENSOR) MISC MISCELLANEOUS CHANGE NEW SENSOR EVERY 14 DAYS      cyclobenzaprine (FLEXERIL)  5 MG tablet Take 5 mg by mouth daily as needed      BYDUREON BCISE 2 MG/0.85ML injection INJECT 2 MG MILLIGRAM PER WEEK INTO SKIN      ferrous sulfate  (IRON  325) 325 (65 Fe) MG tablet Take 325 mg by mouth daily      furosemide  (LASIX ) 40 MG tablet furosemide  40 mg tablet   TAKE 1 TABLET BY MOUTH EVERY DAY      gabapentin (NEURONTIN) 100 MG capsule Take 1 capsule by mouth in the morning and at bedtime.      Glucagon , rDNA, (GLUCAGON  EMERGENCY) 1 MG KIT INJECT 1 MILLIGRAM(S) INJECTION EVERY DAY AS NEEDED FOR LOW BLOOD DUGAR      HUMALOG  MIX 75/25 KWIKPEN (75-25) 100 UNIT/ML SUPN injection pen Inject 17 Units into the skin 2 times daily (with meals)      lisinopril  (PRINIVIL ;ZESTRIL ) 10 MG tablet TAKE 1 TABLET BY MOUTH EVERY DAY      pantoprazole (PROTONIX) 40 MG tablet Take 40 mg by mouth daily as needed       No current facility-administered medications for this visit.       ALLERGIES    No Known Allergies       SOCIAL HISTORY    Social History     Socioeconomic History    Marital status: Single   Tobacco Use    Smoking status: Every Day     Packs/day: 1.00     Types: Cigarettes    Smokeless tobacco: Never   Substance and Sexual Activity    Alcohol use: No    Drug use: No       FAMILY HISTORY    Family History   Problem Relation Age of Onset    Diabetes Other 44        parent, NOS    Cancer Father     Hypertension Other 35        parent,NOS    Heart Disease Other 67        parent, NOS    Lung Disease Father        VITALS  There were no vitals taken for this visit.    PAIN  ASSESSMENT  AMB PAIN ASSESSMENT 09/24/2021   Location of Pain Back   Severity of Pain 4   Quality of Pain Other (Comment)   Duration of Pain Persistent   Frequency of Pain Constant   Aggravating Factors Walking;Other (Comment)   Limiting Behavior Yes   Relieving Factors Other (Comment)   Result of Injury No   Work-Related Injury No   Are there other pain locations you wish to document? No         Constitutional:  Well developed, well nourished, in no acute distress.   Psychiatric: Affect and mood are appropriate.   HEENT: Normocephalic, atraumatic. Extraocular movements intact.   Integumentary: No rashes or abrasions noted on exposed areas.    Cardiovascular: Regular rate and rhythm.   Pulmonary: Clear to auscultation bilaterally.    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 Mild scoliosis, right   convexity. Mild straightening of the lumbar lordosis with mild retrolisthesis of   the fused L4-L5 segment. No focal compression deformity. Schmorl's formation,   discogenic endplate edema at Z6-X0. No pathologic marrow signal. Conus   medullaris ends at L2 with normal morphology and signal intensity       Sagittal images show posterior disc  bulge with mild cord contact at T10-T11 and   T11-T12. No cord compression. Severe bilateral foraminal stenosis when combined   with facet and ligamentous hypertrophy.       T12-L1: Right posterior lateral disc protrusion/foraminal disc protrusion. No   central stenosis or cord contact. Patent left foramen. Moderately severe right   foraminal stenosis.       L1-L2: No disc herniation or central stenosis. Facet and ligamentous hypertrophy   with mild to moderate foraminal stenosis.       L2-L3: Mild posterior disc bulge. Prominent posterior epidural fat. AP canal   measures 9.3 mm, mild central stenosis. Facet and ligamentous hypertrophy with   moderate bilateral foraminal stenosis.        L3-L4: Posterior disc bulge. Severe facet and ligamentous hypertrophy. Facet   joint inflammation and trace joint effusion. Edema in the right L4 pedicle.   Overall, AP canal measures 6 mm, moderately severe to severe central stenosis.   Moderately severe foraminal stenosis with suspected mild compression of left   exiting L3 nerve root.       L4-L5: Posterior disc bulge and endplate spondylosis. Severe facet and   ligamentous hypertrophy. Severe central stenosis. Severe left and moderate right   foraminal stenosis. Compression of left exiting L4 nerve root. Mild compression   of right exiting L4 nerve root also possible.       L5-S1: Right posterior lateral disc protrusion and endplate spondylosis.   Displacement of the right descending S1 nerve root. Moderate central stenosis.   Facet and ligamentous hypertrophy with mild inflammation. Severe right and   moderate left foraminal stenosis. Compression of right exiting L5 nerve root.       IMPRESSION   1. Right-sided disease, with posterior displacement of right S1 nerve root and   compression of right exiting L5 nerve root.   2. Left-sided disease, central stenosis and additional findings as described.     2V (AP/LAT) Lumbar Spine XR images taken on 11/22/20 personally reviewed with patient:   Pelvic obliquity - left side higher than right   Reversal of lumbar lordosis.   Severe disc space narrowing at L4-5, L5-S1   No obvious compression fractures or instabilities    The limitations of a telemedicine visit including the inability to perform a detailed physical examination may miss significant findings was presented to the patient, and the patient still elected to proceed with the telemedicine visit.     PMP reviewed     Ms. Kolk has a reminder for a "due or due soon" health maintenance. I have asked that she contact her primary care provider for follow-up on this health maintenance.     Services were provided through a audio-video synchronous discussion virtually to  substitute for in-person clinic visit.     I spent at least minutes: 21-30 minutes with this established patient, and >50% of the time was spent counseling and/or coordinating care.      Written by Rebekah Canada, [employer], as dictated by Dr. Jennett Model.

## 2021-09-24 NOTE — Progress Notes (Signed)
 [name] Short presents today for   Chief Complaint   Patient presents with    Back Pain              Is someone accompanying this pt? yes, female can be heard talking with pt in the back ground    Is the patient using any DME equipment during OV? no    Depression Screening:  PHQ-9 Questionaire 09/24/2021 02/26/2021   Little interest or pleasure in doing things 0 0   Feeling down, depressed, or hopeless 0 0   PHQ-9 Total Score 0 0     PHQ Scores 09/24/2021 02/26/2021   PHQ2 Score 0 0   PHQ9 Score 0 0       Learning Assessment:  Who is the primary learner? Patient    What is the preferred language for health care of the primary learner? ENGLISH    How does the primary learner prefer to learn new concepts? PICTURES    How does the primary learner prefer to learn new concepts? DEMONSTRATION    Answered By patient    Relationship to Learner SELF          Coordination of Care:  1. Have you been to the ER, urgent care clinic since your last visit? no  Hospitalized since your last visit? no    2. Have you seen or consulted any other health care providers outside of the [facility] since your last visit? Yes, neurosurgeon, neurology, pulmonology, pcp  Include any pap smears or colon screening. no

## 2021-09-29 NOTE — Progress Notes (Signed)
Subjective: Julie Elliott is a 66 y.o. female patient seen today for follow up of  at risk foot care with history of diabetic neuropathy and thick, elongated toenails 1-5 bilaterally which are tender when wearing enclosed shoe gear..   New problem(s)/concern(s) today: None    Julie Elliott is accompanied by her son, [name], on today's visit. She has not seen the Dermatologist for multiple nevi of both feet.  PCP is Rick Duff, MD. Last visit was: 08/20/2021.  Allergies  Allergen Reactions   Loratadine-Pseudoephedrine Er Hives    Claritin-D    Objective: Physical Exam  General: Patient is a pleasant [name] in NAD. AAO x 3.   Neurovascular Examination: CFT <3 seconds b/l LE. Palpable DP pulse(s) b/l LE. Palpable PT pulse(s) b/l LE. Pedal hair absent. No pain with calf compression b/l. Lower extremity skin temperature gradient within normal limits. Nonpitting edema noted BLE. No cyanosis or clubbing noted b/l LE.  Pt has subjective symptoms of neuropathy. Protective sensation intact 5/5 intact bilaterally with 10g monofilament b/l. Vibratory sensation intact b/l.  Dermatological:  Pedal skin is warm and supple b/l LE. No open wounds b/l LE. No interdigital macerations noted b/l LE. Toenails 1-5 bilaterally elongated, discolored, dystrophic, thickened, and crumbly with subungual debris and tenderness to dorsal palpation. Multiple nevi noted b/l LE, medial aspect right foot and medial aspect right 5th digit. Hyperkeratotic lesion(s) submet head 5 b/l.  No erythema, no edema, no drainage, no fluctuance.  Musculoskeletal:  Muscle strength 5/5 to all lower extremity muscle groups bilaterally. No pain, crepitus or joint limitation noted with ROM bilateral LE. No gross bony deformities bilaterally.  Assessment: 1. Pain due to onychomycosis of toenails of both feet   2. Callus   3. Diabetic peripheral neuropathy associated with type 2 diabetes mellitus ([place])     Plan: Patient was evaluated and treated and all questions answered. Consent given for treatment as described below: -They will schedule appointment with Dermatologist for  evaluation of multiple nevi of both LE.. -Mycotic toenails 1-5 bilaterally were debrided in length and girth with sterile nail nippers and dremel without incident. -Callus(es) submet head 5 b/l pared utilizing sterile scalpel blade without complication or incident. Total number debrided =2. -Patient/POA to call should there be question/concern in the interim.  Return in about 3 months (around 12/21/2021).  Marzetta Board, DPM

## 2021-10-11 ENCOUNTER — Encounter: Payer: PRIVATE HEALTH INSURANCE | Primary: Registered Nurse

## 2021-10-14 ENCOUNTER — Encounter: Admit: 2021-10-14 | Discharge: 2021-10-14 | Payer: PRIVATE HEALTH INSURANCE | Primary: Registered Nurse

## 2021-10-14 NOTE — Home Health (Signed)
 Skilled services/Home bound verification:     Skilled Reason for admission/summary of clinical condition: HTN  66 y.o. female with a PMH of CVA, DM, OSA, anemia, cervical myelopathy, spinal stenosis lumbar region, syncope who was referred to home health for HTN and leg pain. Home health admission completed and paperwork signed. Patient is alert and oriented x 4, pleasant and cooperative but forgetful at times. Speech therapy eval requested. Patient does complain of bilateral leg and back pain. She is taking gabapentin as ordered. According to notes surgical intervention is required and patient's surgery is scheduled in 4 weeks. Patient BP elevated at visit at 180/100, patient does complain of dizziness. SN notified PCP to see if patient can take extra lisinopril  and PCP agreed and requested visit for tomorrow BP check and notify. Scheduling made aware. No edema present. No cp or heart palpitations. Patient educated on no salt added to foods and limiting caffeine intake. Respiratory rate regular, lungs clear in all fields. Bowel sounds present x 4, no n/v/d/c or s/s of UTI. Patient is diabetic and currently on insulin  but not wearing her freestyle libre 3. SN applied the sensor and patient tolerated well. Patient has some unsteady gait and difficulty getting up, using a cane at this time. PT and OT eval requested. Patient denies any recent falls. HHA requested to assist with showering safely. Patient's home is very cluttered with papers piled up everywhere. SN discussed the safety and fall risks with patient and caregiver. Patient's daughter lives with her but works during the day. MSW eval ordered to assist with caregiver resources, possible life alert and meals on wheels. There are major drug interactions between clopidogrel  and pantoprazole. These are not new medications and patient is not having any side effects or complications from these meds at this time. PCP notified of this. Med rec performed and all meds  available in the home. Patient educated For plavix  - Take it at the same time each day. Take it with a full glass of water . You can take it with or without food. Use a pillbox to help keep track of your doses. If you miss a dose. Never take a double dose. Increased risk of bleeding. Increased bruising. Put on a pressure dressing if you cut yourself. If you cannot stop the bleeding, seek emergency help. Patient is on Humalog  and educated to take Humalog  either 15 minutes before eating or right away after eating. If you don't eat, taking Humalog  may lower your blood sugar levels to dangerously low levels. Rotate injection sites. Do not inject into veins or muscles. Look at humalog  before injecting, it should be clear and colorless. Patient educated on s/s of hypoglycemia and how to treat.    This patient is homebound for the following reasons Requires considerable and taxing effort to leave the home , Requires the assistance of 1 or more persons to leave the home  and Only leaves the home for medical reasons or religious services and are infrequent and of short duration for other reasons .    Caregiver: relative.  Caregiver assists with meals, errands, meds.    Medications reconciled and all medications are available in the home this visit.      The following education was provided regarding medications: all medications reviewed and educated on to include: name, dose, route, frequency, side effects and effectiveness  Medications  are not effective at this time.      High risk medication teaching regarding anticoagulants, hyperglycemic agents or opiod narcotics performed (specify) -  humalog  and clopidogrel     [facility], APRN - NP notified of any discrepancies/look a like medications/medication interactions - pantoprazole and clopidogrel ,    Home health supplies by type and quantity ordered/delivered this visit include: n/a    Patient education provided this visit to include: see interventions    Patient level of  understanding of education provided: needs additional education, reinforcement    Sharps Education Provided: Clinician instructed patient/CG on proper disposal of sharps: Containers should be made of hard plastic, be puncture-resistant and leakproof,   such as a laundry detergent or bleach bottle. When the container is  full, it should be sealed with tape and labeled   DO NOT RECYCLE prior to discarding in the regular trash.     Patient response to procedure performed:  tolerated freestyle libre 3 placement well and tolerated assessment well    Home exercise program/Homework provided: Patient educated on fall precautions to include Use good lighting in all rooms, make sure you use nightlights, Place frequently used items in easy-to-reach places, Set up furniture so that there are clear paths around it, Remove throw rugs and other tripping hazards from the floors, Avoid walking on wet floors, Keep moving. Physical activity can go a long way toward fall prevention, Wear sensible shoes with backs on them, Use assistive devices.  Report any s/sx of abnormal bleeding to MD immediately/seek medical treatment for any: black tary stools, dark/tea/blood, hemtoma, dizziness, frequent gum/nose bleeds, unusal brusing, or prolong bleeding. For plavix - Take it at the same time each day. Take it with a full glass of water . You can take it with or without food. Use a pillbox to help keep track of your doses. If you miss a dose. Never take a double dose. Increased risk of bleeding. Increased bruising. Put on a pressure dressing if you cut yourself. If you cannot stop the bleeding, seek emergency help.  importance of compliance with medication regimen, follow up with MD as ordered, safety and fall prevention, compliance with diabetic diet, assessing diabetic feet daily, checking blood sugar daily. Monitor for s/s of hypo/hyperglycemia.    Pt/Caregiver instructed on plan of care and are agreeable to plan of care at this time.       Physician Freada Jacobs, APRN - NP notified of patient admission to home health and plan of care including anticipated frequency of SN 3w1, 2w2, 1w3, 2prn and treatments/interventions/modalities of disease process management, pain management, med management, fall and safety education    Discharge planning discussed with patient and caregiver.  Discharge planning as follows: discharge when all goals met.  Pt/Caregiver did verbalize understanding of discharge planning.     Next MD appointment TBD (date) with MD/NP/PA.  Patient/caregiver encouraged/instructed to keep appointment as lack of follow through with physician appointment could result in discontinuation of home care services for non-compliance.

## 2021-10-15 ENCOUNTER — Encounter: Admit: 2021-10-15 | Discharge: 2021-10-15 | Payer: PRIVATE HEALTH INSURANCE | Primary: Registered Nurse

## 2021-10-15 ENCOUNTER — Other Ambulatory Visit: Payer: Self-pay

## 2021-10-15 DIAGNOSIS — I5042 Chronic combined systolic (congestive) and diastolic (congestive) heart failure: Secondary | ICD-10-CM

## 2021-10-15 MED ORDER — ENALAPRIL MALEATE 5 MG PO TABS: 5.0000 mg | ORAL_TABLET | Freq: Two times a day (BID) | ORAL | 2 refills | Status: DC

## 2021-10-15 NOTE — Home Health (Signed)
 Skilled reason for visit: HTN    Caregiver involvement: assists with meds, meals, errands    Medications reviewed and all medications are available in the home this visit.    The following education was provided regarding medications:  humalog  and lisinopril .    MD notified of any discrepancies/look a-like medications/medication interactions: none noted  Medications are too early to tell if effective at this time.      Home health supplies by type and quantity ordered/delivered this visit include: n/a    Patient education provided this visit: see interventions    Sharps education provided: Clinician instructed patient/CG on proper disposal of sharps: Containers should be made of hard plastic, be puncture-resistant and leakproof,   such as a laundry detergent or bleach bottle. When the container is  full, it should be sealed with tape and labeled   DO NOT RECYCLE prior to discarding in the regular trash.     Patient level of understanding of education provided: see interventions    Skilled Care Performed this visit: BP check. 171/90 but she has not taken her medications yet. All other vs wnl. Blood sugar 123. SN notified PCP of BP and will await return call.     Patient response to procedure performed:  tolerated assessment well    Agency Progress toward goals: progressing towards agency goals    Patient's Progress towards personal goals: progressing towards personal goals    Home exercise program: Patient educated on fall precautions to include Use good lighting in all rooms, make sure you use nightlights, Place frequently used items in easy-to-reach places, Set up furniture so that there are clear paths around it, Remove throw rugs and other tripping hazards from the floors, Avoid walking on wet floors, Keep moving. Physical activity can go a long way toward fall prevention, Wear sensible shoes with backs on them, Use assistive devices.      Continued need for the following skills: Nursing.    Plan for next  visit: assessment, vitals, med rec, pain     Patient and/or caregiver notified and agrees to changes in the Plan of Care: Yes.     The following discharge planning was discussed with the pt/caregiver: discharge when all goals met

## 2021-10-16 ENCOUNTER — Encounter: Payer: PRIVATE HEALTH INSURANCE | Primary: Registered Nurse

## 2021-10-16 ENCOUNTER — Encounter: Admit: 2021-10-16 | Discharge: 2021-10-16 | Payer: PRIVATE HEALTH INSURANCE | Primary: Registered Nurse

## 2021-10-16 NOTE — Home Health (Deleted)
 April 10th - spine surgery

## 2021-10-17 ENCOUNTER — Encounter: Payer: PRIVATE HEALTH INSURANCE | Primary: Registered Nurse

## 2021-10-17 NOTE — Case Communication (Signed)
 Does this pt still need OT services?  She is on a download list for me to evaluate possibly by next week.  I see PT has discharged and she is planning to return to work in the next few weeks.  ----- Message -----  From: Everet Hitchcock, RN  Sent: 10/16/2021   5:36 PM EDT  To: Sarrah Cure, RN, Darletta Ehrich, RN, *  Subject: RE:                                                Patient was a discharge with PT today. Patient told this SN sh e was working from home and today told PT she was going in the office 3x a week.     thanks,  Ludwig Safer  ----- Message -----  From: Hubert Madden, CNA  Sent: 10/16/2021  11:33 AM EDT  To: Sarrah Cure, RN, Everet Hitchcock, RN, *      called  2 times,left messages,no response

## 2021-10-18 ENCOUNTER — Encounter: Payer: PRIVATE HEALTH INSURANCE | Primary: Registered Nurse

## 2021-10-19 ENCOUNTER — Encounter: Payer: PRIVATE HEALTH INSURANCE | Primary: Registered Nurse

## 2021-10-21 ENCOUNTER — Encounter: Payer: PRIVATE HEALTH INSURANCE | Primary: Registered Nurse

## 2021-10-22 ENCOUNTER — Encounter: Payer: PRIVATE HEALTH INSURANCE | Primary: Registered Nurse

## 2021-10-25 ENCOUNTER — Encounter: Payer: PRIVATE HEALTH INSURANCE | Primary: Physician Assistant

## 2021-10-25 ENCOUNTER — Encounter: Payer: PRIVATE HEALTH INSURANCE | Primary: Registered Nurse

## 2021-10-29 ENCOUNTER — Encounter: Payer: PRIVATE HEALTH INSURANCE | Primary: Physician Assistant

## 2021-10-29 ENCOUNTER — Encounter: Payer: PRIVATE HEALTH INSURANCE | Primary: Registered Nurse

## 2021-10-31 LAB — HEMOGLOBIN A1C
Estimated Avg Glucose, External: 164 mg/dL — ABNORMAL HIGH (ref 91–123)
Hemoglobin A1C, External: 7.4 % — ABNORMAL HIGH (ref 4.8–5.6)

## 2021-11-01 ENCOUNTER — Encounter: Payer: PRIVATE HEALTH INSURANCE | Primary: Physician Assistant

## 2021-11-01 ENCOUNTER — Encounter: Payer: PRIVATE HEALTH INSURANCE | Primary: Registered Nurse

## 2021-11-05 ENCOUNTER — Other Ambulatory Visit: Payer: Self-pay

## 2021-11-05 DIAGNOSIS — I5042 Chronic combined systolic (congestive) and diastolic (congestive) heart failure: Secondary | ICD-10-CM

## 2021-11-05 MED ORDER — METOPROLOL SUCCINATE ER 50 MG PO TB24: ORAL_TABLET | ORAL | 3 refills | Status: DC

## 2021-11-06 ENCOUNTER — Encounter: Payer: PRIVATE HEALTH INSURANCE | Primary: Registered Nurse

## 2021-11-06 NOTE — Other (Signed)
PRE-SURGICAL INSTRUCTIONS        Patient's Name:  [name] Short      [id number] Date:  11/06/2021            Covid Testing Date and Time:    Surgery Date:  11/11/2021                Do NOT eat or drink anything, including candy, gum, or ice chips after midnight on 11/10/2021, unless you have specific instructions from your surgeon or anesthesia provider to do so.  You may brush your teeth before coming to the hospital.  No smoking 24 hours prior to the day of surgery.  No alcohol 24 hours prior to the day of surgery.  No recreational drugs for one week prior to the day of surgery.  Leave all valuables, including money/purse, at home.  Remove all jewelry, nail polish, acrylic nails, and makeup (including mascara); no lotions powders, deodorant, or perfume/cologne/after shave on the skin.  Follow instruction for Hibiclens washes and CHG wipes from surgeon's office.   Glasses/contact lenses and dentures may be worn to the hospital.  They will be removed prior to surgery.  Call your doctor if symptoms of a cold or illness develop within 24-48 hours prior to your surgery.  11.  If you are having an outpatient procedure, please make arrangements for a responsible ADULT TO DRIVE YOU HOME AFTER SURGERY and stay with you for 24 hours after your surgery.  12.  ONE VISITOR in the hospital at this time for outpatient procedures.  Exceptions may be made for surgical admissions, per nursing unit guidelines      Special Instructions:      Bring list of CURRENT medications.  Bring any pertinent legal medical records.  Take these medications the morning of surgery with a sip of water:  As directed by physician  Follow physician instructions about insulin.  Follow physician instructions about stopping anticoagulants.    On the day of surgery, come in the main entrance of [facility].  Let the security guard at the desk know you are there for surgery.  A staff member will come escort you to the surgical area on the second  floor.    If you have any questions or concerns, please do not hesitate to call:     (Prior to the day of surgery) PAT department:  [phone]   (Day of surgery) Pre-Op department:  [phone]    These surgical instructions were reviewed with patient during the PAT phone call.

## 2021-11-11 ENCOUNTER — Encounter: Payer: PRIVATE HEALTH INSURANCE | Primary: Registered Nurse

## 2021-11-12 ENCOUNTER — Encounter: Admit: 2021-11-12 | Discharge: 2021-11-12 | Payer: PRIVATE HEALTH INSURANCE | Primary: Physician Assistant

## 2021-11-12 NOTE — Home Health (Signed)
Skilled services/Home bound verification:     Skilled Reason for admission/summary of clinical condition:  cervical myelopathy, HTN  66 y.o. female with pmh of Anemia, Arthritis, Diabetes, HTN, Hyperlipemia, Stroke referred to home health for cervical myelopathy, recent fall and surgery put on hold due to HTN. Patient on couch at nursing arrival. Patient lives in 2 story home with her daughter. THe home is very cluttered and barely a walk way throughout rooms of the house, papers are piled high everywhere. It is unsafe and a fall hazard. Patient and daughter are aware and SN discussed at visit today. Patient's daughter did attempt to get assist through social services but patient isn't eligible and according to daughter they could deem the home an unsafe living environment. Patient does have running water, no leaks, heat and cooling. Patient had a fall approx 2 weeks ago and hit her head. No obvious injury. Patient does appear forgetful of immediate events. but this is not new. SN to continue to monitor. Referral notes that surgery cancelled due to blood sugar being evaluated. However, blood sugars have been WNL. Yesterday was 57 and today was 112. Her sugars have been stable. However, her blood pressure has not been stable and is frequently out of parameters. SN took BP this am prior to medication and it was 153/95 and 1hr after it was 151/83. Patient educated extensively on HTN management to include: diet management, limiting caffeine, stress prevention, pain management, taking meds as directed. SN directed daughter to take BP prior to meds and 1hr after and log them, along with logging her blood sugars. Patient and daughter verbalized understanding. SN to reassess logs at next visit and notify dr of BP ranges. Respiratory rate regular, no cough present and lungs clear in all fields. Heart rate regular, no cp or heart palpitations. Left knee edema present. Patient notes her knee swells frequently. Bowel sounds  present x 4, no n/d/v/c or s/s of UTI. Patient does have back and left knee pain and is taking her pain meds as directed. For plavix - Take it at the same time each day. Take it with a full glass of water. You can take it with or without food. Use a pillbox to help keep track of your doses. If you miss a dose. Never take a double dose. Increased risk of bleeding. Increased bruising. Put on a pressure dressing if you cut yourself. If you cannot stop the bleeding, seek emergency help. Patient is on Humalog and educated to take Humalog either 15 minutes before eating or right away after eating. If you don't eat, taking Humalog may lower your blood sugar levels to dangerously low levels. Rotate injection sites. Do not inject into veins or muscles. Look at [place] before injecting, it should be clear and colorless. Patient educated on s/s of hypoglycemia and how to treat.    This patient is homebound for the following reasons Requires considerable and taxing effort to leave the home , Requires the assistance of 1 or more persons to leave the home  and Only leaves the home for medical reasons or religious services and are infrequent and of short duration for other reasons .    Caregiver: relative.  Caregiver assists with errands, meals, dr appointments.    Medications reconciled and all medications are available in the home this visit.      The following education was provided regarding medications: all medications reviewed and educated on to include: name, dose, route, frequency, side effects and effectiveness  Medications  are too early to tell if effective at this time.      High risk medication teaching regarding anticoagulants, hyperglycemic agents or opiod narcotics performed (specify) - humalog, clopidogrel    [place], APRN - NP notified of any discrepancies/look a like medications/medication interactions - pantoprazole and clopidogrel, grapefruit juice and clopidogrel.     Home health supplies by type  and quantity ordered/delivered this visit include: n/a    Patient education provided this visit to include: see interventions.      Patient level of understanding of education provided: see interventions    Sharps Education Provided: Clinician instructed patient/CG on proper disposal of sharps: Containers should be made of hard plastic, be puncture-resistant and leakproof,   such as a laundry detergent or bleach bottle. When the container is  full, it should be sealed with tape and labeled   DO NOT RECYCLE prior to discarding in the regular trash.     Patient response to procedure performed:  tolerated assessment well    Home exercise program/Homework provided: Patient educated on fall precautions to include Use good lighting in all rooms, make sure you use nightlights, Place frequently used items in easy-to-reach places, Set up furniture so that there are clear paths around it, Remove throw rugs and other tripping hazards from the floors, Avoid walking on wet floors, Keep moving. Physical activity can go a long way toward fall prevention, Wear sensible shoes with backs on them, Use assistive devices.  Report any s/sx of abnormal bleeding to MD immediately/seek medical treatment for any: black tary stools, dark/tea/blood, hemtoma, dizziness, frequent gum/nose bleeds, unusal brusing, or prolong bleeding.  importance of compliance with medication regimen, follow up with MD as ordered, safety and fall prevention, compliance with diabetic diet, assessing diabetic feet daily, checking blood sugar daily. Monitor for s/s of hypo/hyperglycemia.    Pt/Caregiver instructed on plan of care and are agreeable to plan of care at this time.      Elly Modena Deatherage, APRN - NP notified of patient admission to home health and plan of care including anticipated frequency of 2w4, 1w4, 2prn and treatments/interventions/modalities of disease management, pain management, med management.    Discharge planning discussed with patient and  caregiver.  Discharge planning as follows: discharge when all goals met.  Pt/Caregiver did verbalize understanding of discharge planning.     Next MD appointment TBD (date) with MD/NP/PA.  Patient/caregiver encouraged/instructed to keep appointment as lack of follow through with physician appointment could result in discontinuation of home care services for non-compliance.

## 2021-11-13 ENCOUNTER — Encounter: Payer: PRIVATE HEALTH INSURANCE | Primary: Physician Assistant

## 2021-11-14 ENCOUNTER — Encounter: Payer: PRIVATE HEALTH INSURANCE | Primary: Registered Nurse

## 2021-11-15 ENCOUNTER — Encounter: Admit: 2021-11-15 | Discharge: 2021-11-15 | Payer: PRIVATE HEALTH INSURANCE | Primary: Physician Assistant

## 2021-11-15 ENCOUNTER — Other Ambulatory Visit: Payer: Self-pay | Admitting: *Deleted

## 2021-11-15 ENCOUNTER — Other Ambulatory Visit: Payer: Self-pay | Admitting: Student

## 2021-11-15 DIAGNOSIS — Z8639 Personal history of other endocrine, nutritional and metabolic disease: Secondary | ICD-10-CM

## 2021-11-15 NOTE — Home Health (Signed)
RECENT HX OF CURRENT ILLNESS/REASON FOR REFERRAL: Patient referred to [facility] for cognitive linguistic concerns.     PLOF/DIET/COMMUNICATION: independent, patient working outside of the home    PAST MEDICAL HISTORY: . Anemia     . Arthritis     . Diabetes ([place])     . History of colon polyps     . HTN (hypertension)     . Hyperlipemia     . Menopause     . Serum calcium elevated     . Stroke [facility])      CURRENT RESIDENCE/LIVING SITUATION: Lives with daughter     EDUCATIONAL LEVEL: 2 years of college     SUBJECTIVE (PT/FAMILY COMMENTS, THERAPIST OBSERVATIONS): Patient states blood pressure needs to be under control before surgery.Denies memeory difficulty.     CAREGIVER INVOLVEMENT: caregivers provide assistance wiht meidcations meals and ADLs.    ASSESSMENT / BARRIERS / PLAN: Patient recieved cognitive linguisitc evaluaiton utilizing the [place] Status Examination scoring a 25 of 30 which is remarkable for a mild neuroconitive deficit. Throughout assessment patient was aware of most of her deficit areas and stated she would like help so she coudl return to her job at [employer]. Patient is a good candidate fro congitive linguistic therapy.     PATIENT RESPONSE TO TREATMENT:  Pt is cooperative and pleasant     PATIENT LEVEL OF UNDERSTANDING OF EDUCATION PROVIDED: Pt agrees with ST POC        MD NOTIFIED OF POC AND FREQUENCY     PT GOALS: return to work     HOME EXERCISE PROGRAM: deferred to next visit     Stacy - ([place]): ST to d/c in 8 more visits or when goals met/max potential achieved, Pt/caregiver verbalized understanding

## 2021-11-16 ENCOUNTER — Encounter: Admit: 2021-11-16 | Discharge: 2021-11-16 | Payer: PRIVATE HEALTH INSURANCE | Primary: Physician Assistant

## 2021-11-16 NOTE — Home Health (Signed)
Skilled reason for visit:  Cervical Myelopathy,Hypertension,Diabetes,Safety Precautions, Infection Prevention, Skin Impairment Prevention, and Medication Education     Caregiver involvement: Patient prepares her own small meals, schedules her own appointments, and her daughter takes her to and from appointments. Patient will have her family pick up medications, and assist with toileting and bathing if needed, but she states she sponges off as best as she can for the most part.    Medications reviewed and all medications ARE available in the home this visit.      The following education was provided regarding medications: Gabapentin-Patient/Caregiver was educated on this medication and the purpose of it. Patient/Caregiver verbalized understanding    MD notified of any discrepancies/look a-like medications/medication interactions: NA    Medications are EFFECTIVE at this time.      Home health supplies by type and quantity ordered/delivered this visit include: NA    Patient education provided this visit: See interventions Tab.    Sharps education provided: Clinician instructed patient/CG on proper disposal of sharps: Containers should be made of hard plastic, be puncture-resistant and leak proof, such as a laundry detergent or bleach bottle. When the container is  full, it should be sealed with tape and labeled DO NOT RECYCLE prior to discarding in the regular trash.     Patient level of understanding of education provided: Patient and caregiver verbalized understanding to all education in interventions tab.    Skilled Care Performed this visit:  Disease and Medication, Safety, Infection Prevention, Education and Management    Patient response to procedure performed:   Patient tolerated vital assessment well and no facial grimaces or complaints of pain or discomfort. Patient was sitting on her couch answering questions that this nurse was asking her.    Agency Progress toward goals: Agency staff is progressing well  with patient as patient verbalizes being able to better manage her disease processes with the education received from home care staff.    Patient's Progress towards personal goals:  Patient is progressing slowly towards goals.    Home exercise program: Patient will be sure to contact MD office on Monday morning and inform them that her Gabapentin makes her hallucinate and see bugs as she stated to this nurse today. Patients daughter and caregiver states she had never stated that before and patient states she only sees the when she is awake and it makes her sleepy, so she takes it when she know she is not going anywhere, so she can just go to sleep. This nurse informed her that she needed to inform MD as this was not good and that is a side effect that MD need to be aware of. Patient and her daughter verbalized understanding.    Continued need for the following skills: Nursing will continue to follow patient until education is understood and able to be verbalized by patient/caregiver.    Plan for next visit: Continue to educate, assess weight, assess vitals, and review medications    Patient and/or caregiver notified and agrees to changes in the Plan of Care: NA    The following discharge planning was discussed with the pt/caregiver:  when patient reaches goals and medication is managed, and disease processes are understood patient agrees and understand that discharge will take place.

## 2021-11-18 ENCOUNTER — Encounter: Admit: 2021-11-18 | Discharge: 2021-11-18 | Payer: PRIVATE HEALTH INSURANCE | Primary: Physician Assistant

## 2021-11-18 NOTE — Home Health (Signed)
PMHx: . Anemia     . Arthritis     . Diabetes ([place])     . History of colon polyps     . HTN (hypertension)     . Hyperlipemia     . Menopause     . Serum calcium elevated     . Stroke [facility])   SUBJECTIVE:Pt states she was having neck surgery but was founf to not be medically stable and the surgery was rescheduled for 5/8 and was referred to home care for disease management.  LIVING SITUATION: Lives with adult daughter in a single story home with 3 steps to enter. Pt very cluttered with minimal room to walk  REQUIRES CAREGIVER ASSISTANCE FOR: Pts daughter assists with medications, meals and transportation to appointments  PLOF: [place].   MEDICATIONS REVIEWED AND UPDATED: No changes noted  NEXT MD APPT: surgery scheduled 5/8  ROM:B LE WNL  WOUNDS:NA  BED MOBILITY:independent, sleeps on couch, unable to access bedroom  TRANSFERS:Sup with sit to stand from low couch and toilet with SC  GAIT: X69f with SC and SBA.  Gait characterized by unsteady with changing directions and grabbing for objects d/t clutter in the home.  STAIRS:NT  BALANCE: Pt scored 10/28 on [place] Balance Assessment placing patient at high risk for falls.   PATIENT EDUCATION PROVIDED THIS VISIT: safety, HEP, walking, deep breathing, clear pathways and pressure relief    HEP consisting of:  1. Walking every hour during the day with SC   2. Change positions/stand every hour  3. Deep breathing   Written instructions issued.  Patient/caregiver verbalized understanding.   PATIENT RESPONSE TO TREATMENT:  CONTINUED NEED FOR THE FOLLOWING SKILLS: HH PT is medically necessary to address pain, decreased strength, decreased independence with functional transfers, impaired gait, impaired stair negotiation, and impaired balance in order to improve functional independence, quality of life, return to PLOF, and reduce the risk for falls.  PLAN: PT 2W3, 1W1 for B LE stregthening, gait training, balance training and reassess after neck surgery scheduled for 5/8.      DISCHARGE PLANNING DISCUSSED: Discharge to self and family under MD supervision once all goals have been met or patient has reached max potential.

## 2021-11-19 ENCOUNTER — Encounter: Admit: 2021-11-19 | Discharge: 2021-11-19 | Payer: PRIVATE HEALTH INSURANCE | Primary: Registered Nurse

## 2021-11-19 ENCOUNTER — Encounter: Payer: PRIVATE HEALTH INSURANCE | Primary: Registered Nurse

## 2021-11-20 ENCOUNTER — Encounter: Payer: PRIVATE HEALTH INSURANCE | Primary: Physician Assistant

## 2021-11-20 NOTE — Telephone Encounter (Signed)
Synjardy refill done 4/17. ?

## 2021-11-21 ENCOUNTER — Encounter: Admit: 2021-11-21 | Discharge: 2021-11-21 | Payer: PRIVATE HEALTH INSURANCE | Primary: Physician Assistant

## 2021-11-21 ENCOUNTER — Encounter: Payer: PRIVATE HEALTH INSURANCE | Primary: Physician Assistant

## 2021-11-21 NOTE — Home Health (Signed)
SUBJECTIVE: Pt reports she is very hungry since she didn't get around to eating breakfast yet.  Pt also reported she was tired.  CAREGIVER INVOLVEMENT/ASSISTANCE NEEDED FOR: pts dtr assist pt with all needs prn.  .  OBJECTIVE: See interventions.  PATIENT EDUCATION PROVIDED THIS VISIT: Pt instructed to continue HEP in sitting 2x/day to BLEs and issued pt a  copy of HEP.  Pt instructed to decrease pace with amb and pts dtr to remove clutter in home for safe amb.  PATIENT RESPONSE TO EDUCATION PROVIDED: Pt demonstrates a good understanding of sitting HEP and pts dtr reports she is working towards removing clutter from home.  PATIENT RESPONSE TO TREATMENT: Pt appeared easily distracted and had much difficulty focusing on tasks initially.  .  ASSESSMENT OF PROGRESS TOWARD GOALS: Pts home is very cluttered and has narrow passageways to [employer] amb in home.  Pt had 2 slight LOB reaching out for furniture/walls for support.  Pts dtr reports she is working towards organizing home and decreasing clutter.  Pt was easily distracted during PT session requiring frequent cuing to re direct pt on task.  Marland Kitchen  PLAN FOR NEXT VISIT: Will plan to attempt outdoor amb next visit.  THE FOLLOWING DISCHARGE PLANNING WAS DISCUSSED WITH THE PATIENT/CAREGIVER: Pt is scheduled to continue PT 2w2, 1w1.

## 2021-11-21 NOTE — Home Health (Signed)
Skilled reason for visit: ASSESSMENT FOR CERVICAL PAIN AND TEACHING ON DISEASE PROCESS. INSTRUCT ON HYPERTENSON  Caregiver involvement:COOKS, CLEANS AND TAKES PATIENT TO MD APPT.    Medications reviewed and all medications are available in the home this visit.    The following education was provided regarding medications:  INSTRUCTED ON IMPORTANCE OF MEDICATION COMPLIANCY..    MD notified of any discrepancies/look a-like medications/medication interactions: NA  Medications are IN HOME AND EFFECTIVE  at this time.      Home health supplies by type and quantity ordered/delivered this visit include: NA    Patient education provided this visit: TAUGHT S/S OF ELEVATED BLOOD SUGARS , S/S OF HYPERTENSON. SEE NSG INTERVENTIONS, INSTRUCTED ON WHEN TO NOTIFY MD OF MEDICAL CHANGES.  Sharps education provided: YES    Patient level of understanding of education provided:GOOD, VERBALIZED UNDERSTANDING.    Skilled Care Performed this visit:, SEE NSG INTERVENTIONS.TAUGHT DISEASE PROCESS FOR  DIABETES AND HYPERTENSON, MEDICATION AND DIETARY COMPLIANCY. PATIENT PLANNING ON HAVING CERVICAL SURGERY AFTER BP IS UNDER CONTROL.    Patient response to procedure performed:  GOOD.      Patient's Progress towards personal goals: PROGRESSING.    Home exercise program: DEEP BREATHING EXERCISES TO HELP PREVENT PNEUMONIA.    Continued need for the following skills: Nursing.    Plan for next visit: ASSESSMENT AND TEACHING ON DIABETES AND HYPERTENSON.  Patient and/or caregiver notified and agrees to changes in the Plan of Care: Yes.     The following discharge planning was discussed with the pt/caregiver: PATIENT WILL BE DISCHARGED WHEN GOALS ARE MET AND PATIENT IS STABLE.

## 2021-11-22 ENCOUNTER — Encounter: Admit: 2021-11-22 | Discharge: 2021-11-22 | Payer: PRIVATE HEALTH INSURANCE | Primary: Physician Assistant

## 2021-11-22 ENCOUNTER — Encounter: Payer: PRIVATE HEALTH INSURANCE | Primary: Registered Nurse

## 2021-11-22 NOTE — Home Health (Signed)
SUBJECTIVE:   Caregiver states they ar hoping for improvement in all areas follow ACDF surgery scheduled for 12-09-2021.       CAREGIVER INVOLVEMENT / ASSISTANCE NEEDED FOR: caregivers provide assistance with medications meals and ADLs       HOME HEALTH SUPPLIES BY TYPE AND QUANTITY ORDERED/DELIVERED THIS VISIT INCLUDE: N/A         OBJECTIVE / PATIENT RESPONSE TO TREATMENT / PATIENT LEVEL OF UNDERSTANDING OF EDUCATION PROVIDED: see interventions         ASSESSMENT OF PROGRESS TOWARD GOALS: Pt is making appropriate progress toward goals as indicated by orientation [facility] with use of cell phone  and completion of spaced retrieval with 3 words. Patient is ready to increase to next word count level using spaced retrieval. Cognitive linguistic deficit negatively impact patients daily functioning and safety.         CONTINUED NEED FOR THE FOLLOWING SKILLS: Pt presents with mild neurocognitive deficit         PLAN FOR NEXT VISIT: working memory and orientation       THE FOLLOWING DISCHARGE PLANNING WAS DISCUSSED WITH THE PATIENT/CAREGIVER: ST to d/c in 6 more visits/ wks or when goals met/max potential achieved, Pt/caregiver verbalized understanding

## 2021-11-25 ENCOUNTER — Encounter: Admit: 2021-11-25 | Discharge: 2021-11-25 | Payer: PRIVATE HEALTH INSURANCE | Primary: Registered Nurse

## 2021-11-26 ENCOUNTER — Encounter: Admit: 2021-11-26 | Discharge: 2021-11-26 | Payer: PRIVATE HEALTH INSURANCE | Primary: Physician Assistant

## 2021-11-26 ENCOUNTER — Encounter: Admit: 2021-11-26 | Discharge: 2021-11-26 | Payer: PRIVATE HEALTH INSURANCE | Primary: Registered Nurse

## 2021-11-26 NOTE — Home Health (Addendum)
 Skilled reason for visit:ASSESSMENT OF CERVICAL PAIN, PREPARING FOR SURGERY.  Caregiver involvement:COOKS, CLEANS AND TAKES PATIENT TO MD APPT.    Medications reviewed and all medications are available in the home this visit.    The following education was provided regarding medications:  INSTRUCTED ON IMPORTANCE OF MEDICATION COMPLIANCY..    MD notified of any discrepancies/look a-like medications/medication interactions: NA  Medications are IN HOME AND EFFECTIVE  at this time.      Home health supplies by type and quantity ordered/delivered this visit include: NA    Patient education provided this visit: , SEE NSG INTERVENTIONS, taught diabetes, importance of diabetic diet. instructed on decreasing salt intake to help lower sodium and chance of elevated blood pressure.  Sharps education provided: YES    Patient level of understanding of education provided:GOOD, VERBALIZED UNDERSTANDING.    Skilled Care Performed this visit SEE NSG INTERVENTIONS. taught hypertenson and diabetes management.    Patient response to procedure performed:  GOOD.      Patient's Progress towards personal goals: PROGRESSING.    Home exercise program: DEEP BREATHING EXERCISES TO HELP PREVENT PNEUMONIA.    Continued need for the following skills: Nursing.    Plan for next visit:  TEACHING on diabetes and hypertenson. patient awaiting to have cervical surgery.     Patient and/or caregiver notified and agrees to changes in the Plan of Care: Yes.     The following discharge planning was discussed with the pt/caregiver: PATIENT WILL BE DISCHARGED WHEN GOALS ARE MET AND PATIENT IS STABLE.

## 2021-11-26 NOTE — Home Health (Signed)
SUBJECTIVE: Pt reports last week after PT session pt was attempting to open microwave with both hands and had LOB resulting in fall with L knee under her.  Pt reports her dtr assist her off floor but she has had increased pain in L knee since.  CAREGIVER INVOLVEMENT/ASSISTANCE NEEDED FOR: Pts dtr assist pt with all needs prn and present in home during PT session.  .  OBJECTIVE: See interventions.  PATIENT EDUCATION PROVIDED THIS VISIT: Pt instructed to stay closer to counter when managing microwave door or any object with one UE support on counter and   PATIENT RESPONSE TO EDUCATION PROVIDED: Pt reports a good understanding but unable to attempts secondary to pts dtr wanting pts treatment on porch today.  PATIENT RESPONSE TO TREATMENT: Pt was able to perform sitting HEP  with c/o pain in L knee with extension and declined standing exercises secondary to c/o pain in L knee with WB activiites.  .  ASSESSMENT OF PROGRESS TOWARD GOALS: Pt reports she was attempting to open microwave oven door with 2 hands and had LOB resulting in fall to L with L knee under pt.  Pt instructed to keep one hand on counter at all times for balance and to stand closer to counter with attempts to open microwave.  Also reviewed with pt importance of removing clutter in home for safe pathways in home.  Pts dtr did not allow LPTA in home today and pt treatment was performed on pts porch.  Marland Kitchen  PLAN FOR NEXT VISIT: Will plan on PFA with pt.  THE FOLLOWING DISCHARGE PLANNING WAS DISCUSSED WITH THE PATIENT/CAREGIVER: Pt is scheduled for PFA with PT next visit then 2w1, 1w1.

## 2021-11-26 NOTE — Home Health (Signed)
SUBJECTIVE:      Patient states she has noticed increased difficulty when writing number verus putting them in a calculator. She states this has been going on for years. She sometimes notices the error and at the others catches it when she double checks the problem.    CAREGIVER INVOLVEMENT / ASSISTANCE NEEDED FOR: caregivers provide assistance with medications, meals, and ADLs        HOME HEALTH SUPPLIES BY TYPE AND QUANTITY ORDERED/DELIVERED THIS VISIT INCLUDE: N/A         OBJECTIVE / PATIENT RESPONSE TO TREATMENT / PATIENT LEVEL OF UNDERSTANDING OF EDUCATION PROVIDED: - see interventions         ASSESSMENT OF PROGRESS TOWARD GOALS: Pt is making appropriate progress toward goals as indicated by increased working memory for completion of all tasks this day. Patient is independent using her phone to manage appointments and orientation. Reminiscing increases client's engagement, motivation, and success in therapy tasks.         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 Working and short term memory deficit requires increased processing time placing patient at risk for decreased safety and dysfunction completion of ADLs required for management of daily activities and work function.         PLAN FOR NEXT VISIT: working and short term recall       THE FOLLOWING DISCHARGE PLANNING WAS DISCUSSED WITH THE PATIENT/CAREGIVER: ST to d/c in 5 more visits or when goals met/max potential achieved, Pt/caregiver verbalized understanding

## 2021-11-28 ENCOUNTER — Encounter: Admit: 2021-11-28 | Discharge: 2021-11-28 | Payer: PRIVATE HEALTH INSURANCE | Primary: Physician Assistant

## 2021-11-28 ENCOUNTER — Encounter: Admit: 2021-11-28 | Discharge: 2021-11-28 | Payer: PRIVATE HEALTH INSURANCE | Primary: Registered Nurse

## 2021-11-28 ENCOUNTER — Encounter: Payer: PRIVATE HEALTH INSURANCE | Primary: Registered Nurse

## 2021-11-28 NOTE — Other (Signed)
PRE-SURGICAL INSTRUCTIONS        Patient's Name:  [name] Short      TKPTW'S Date:  11/28/2021            Covid Testing Date and Time:    Surgery Date: 12/09/2021                Do NOT eat or drink anything, including candy, gum, or ice chips after midnight on 5/8, unless you have specific instructions from your surgeon or anesthesia provider to do so.  You may brush your teeth before coming to the hospital.  No smoking 24 hours prior to the day of surgery.  No alcohol 24 hours prior to the day of surgery.  No recreational drugs for one week prior to the day of surgery.  Leave all valuables, including money/purse, at home.  Remove all jewelry, nail polish, acrylic nails, and makeup (including mascara); no lotions powders, deodorant, or perfume/cologne/after shave on the skin.  Follow instruction for Hibiclens washes and CHG wipes from surgeon's office.   Glasses/contact lenses and dentures may be worn to the hospital.  They will be removed prior to surgery.  Call your doctor if symptoms of a cold or illness develop within 24-48 hours prior to your surgery.  11.  If you are having an outpatient procedure, please make arrangements for a responsible ADULT TO DRIVE YOU HOME AFTER SURGERY and stay with you for 24 hours after your surgery.  12.  ONE VISITOR in the hospital at this time for outpatient procedures.  Exceptions may be made for surgical admissions, per nursing unit guidelines      Special Instructions:      Bring list of CURRENT medications.  Bring inhaler.  Bring CPAP machine.  Bring any pertinent legal medical records.  Take these medications the morning of surgery with a sip of water:  per MD  Follow physician instructions about insulin.  Follow physician instructions about stopping anticoagulants.Plavix  Complete bowel prep per MD instructions.       On the day of surgery, come in the main entrance of [facility].  Let the security guard at the desk know you are there for surgery.  A staff member  will come escort you to the surgical area on the second floor.    If you have any questions or concerns, please do not hesitate to call:     (Prior to the day of surgery) PAT department:  [phone]   (Day of surgery) Pre-Op department:  [phone]    These surgical instructions were reviewed with [name] during the PAT phone call.

## 2021-11-28 NOTE — Home Health (Signed)
Skilled reason for visit:ASSESSMENT OF CERVICAL PAIN, PREPARING FOR SURGERY.  Caregiver involvement:COOKS, CLEANS AND TAKES PATIENT TO MD APPT.    Medications reviewed and all medications are available in the home this visit.    The following education was provided regarding medications:  INSTRUCTED ON IMPORTANCE OF MEDICATION COMPLIANCY..    MD notified of any discrepancies/look a-like medications/medication interactions: NA  Medications are IN HOME AND EFFECTIVE  at this time.      Home health supplies by type and quantity ordered/delivered this visit include: NA    Patient education provided this visit: , SEE NSG INTERVENTIONS, taught diabetes, importance of diabetic diet. instructed on decreasing salt intake to help lower sodium and chance of elevated blood pressure.  Sharps education provided: YES    Patient level of understanding of education provided:GOOD, VERBALIZED UNDERSTANDING.    Skilled Care Performed this visit SEE NSG INTERVENTIONS. taught hypertenson and diabetes management.    Patient response to procedure performed:  GOOD.      Patient's Progress towards personal goals: PROGRESSING.    Home exercise program: DEEP BREATHING EXERCISES TO HELP PREVENT PNEUMONIA.    Continued need for the following skills: Nursing.    Plan for next visit:  TEACHING on diabetes and hypertenson. patient awaiting to have cervical surgery.     Patient and/or caregiver notified and agrees to changes in the Plan of Care: Yes.     The following discharge planning was discussed with the pt/caregiver: PATIENT WILL BE DISCHARGED WHEN GOALS ARE MET AND PATIENT IS STABLE.

## 2021-11-28 NOTE — Home Health (Signed)
SUBJECTIVE: Pt admits she had a fall when she was trying to open her microwave and she pulled too hard and the door opened and she lost her balance and fell onto her L knee. States her daughter was out walking the dog but came back within 5 minutes and was able to assist her up off the floor. States she did not need EMS services. Reports the knee is tender and sore but okay.   CAREGIVER INVOLVEMENT/ASSISTANCE NEEDED FOR: Pts daughter assists with meals and transportation to appointments  [place] ORDERED/DELIVERED THIS VISIT INCLUDE: none  OBJECTIVE:  See interventions.  MEDICATIONS: Medications reconciled and all medications are available in the home this visit.  The following education was provided regarding medications, medication interactions, and look a like medications: Continue medication as prescribed by MD. Medications are effective at this time.  PATIENT RESPONSE TO TREATMENT:  No c/o increased pain throughout treatment/assessment  PATIENT LEVEL OF UNDERSTANDING OF EDUCATION PROVIDED: Pt needs ongoing education regarding cluttered living and proper footwear for fall prevention  ASSESSMENT OF PROGRESS TOWARD GOALS: Pt is making slow steady progress towards goals. Pt is still scheduled for surgery on 5/8 per pt  CONTINUED NEED FOR THE FOLLOWING SKILLS: Con't with B LE strengthening, gait trianing and balance training for improved safe mobility and fall prevention  PLAN FOR NEXT VISIT: Gait outside over multiple surfaces for improved safe mobility  THE FOLLOWING DISCHARGE PLANNING WAS DISCUSSED WITH THE PATIENT/CAREGIVER: DC to family under MD supervision when goals met/max rehab potential

## 2021-11-29 ENCOUNTER — Encounter: Admit: 2021-11-29 | Discharge: 2021-11-29 | Payer: PRIVATE HEALTH INSURANCE | Primary: Physician Assistant

## 2021-11-29 NOTE — Home Health (Signed)
SUBJECTIVE:   Patient reports no new changes or memory problems.       CAREGIVER INVOLVEMENT / ASSISTANCE NEEDED FOR: caregivers provide assistance with medications, meals, and ADLs       HOME HEALTH SUPPLIES BY TYPE AND QUANTITY ORDERED/DELIVERED THIS VISIT INCLUDE: N/A         OBJECTIVE / PATIENT RESPONSE TO TREATMENT / PATIENT LEVEL OF UNDERSTANDING OF EDUCATION PROVIDED: see interventions         ASSESSMENT OF PROGRESS TOWARD GOALS: Pt is making appropriate progress toward goals as indicated by increased short term recall with use of spaced retrieval for unrelated information. Patient reports increased use of safety precautions to decrease risk of falls and is able to state practical ways in which she can problem solve for memory and safety situations.         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         PLAN FOR NEXT VISIT: short term and working memory       THE FOLLOWING DISCHARGE PLANNING WAS DISCUSSED WITH THE PATIENT/CAREGIVER: ST to d/c in 4 more visits or when goals met/max potential achieved, Pt/caregiver verbalized understanding. Patient would like to discharge from therapy after visits next week due to upcoming surgery. SLP anticipates patient will have met her cognitive goals at that time.

## 2021-12-02 ENCOUNTER — Encounter: Payer: PRIVATE HEALTH INSURANCE | Primary: Physician Assistant

## 2021-12-03 ENCOUNTER — Encounter: Admit: 2021-12-03 | Discharge: 2021-12-03 | Payer: PRIVATE HEALTH INSURANCE | Primary: Physician Assistant

## 2021-12-03 ENCOUNTER — Encounter

## 2021-12-03 NOTE — Home Health (Signed)
Skilled reason for visit: ASSESSMENT FOR CERVICAL MYELOPATHY AND TEACHING ON DISEASE PROCESS.    Caregiver involvement:COOKS, CLEANS AND TAKES PATIENT TO MD APPT.    Medications reviewed and all medications are available in the home this visit.    The following education was provided regarding medications:  INSTRUCTED ON IMPORTANCE OF MEDICATION COMPLIANCY..    MD notified of any discrepancies/look a-like medications/medication interactions: NA  Medications are IN HOME AND EFFECTIVE  at this time.      Home health supplies by type and quantity ordered/delivered this visit include: NA    Patient education provided this visit: TAUGHT S/S OF CERVICAL PAIN,INSTRUCTED ON MEDICATION AND DIETARY COMPLIANCY. SEE NSG INTERVENTIONS, INSTRUCTED ON WHEN TO NOTIFY MD OF MEDICAL CHANGES.  Sharps education provided: YES    Patient level of understanding of education provided:GOOD, VERBALIZED UNDERSTANDING.    Skilled Care Performed this visit:, SEE NSG INTERVENTIONS.TAUGHT DISEASE PROCESS FOR CERVICAL MYELOPAHATY, MEDICATION AND DIETARY COMPLIANCY. INSTRUCTED ON PAIN MANAGEMENT.    Patient response to procedure performed:  GOOD.      Patient's Progress towards personal goals: PROGRESSING.    Home exercise program: DEEP BREATHING EXERCISES TO HELP PREVENT PNEUMONIA.    Continued need for the following skills: Nursing.    Plan for next visit: ASSESSMENT AND TEACHING ON PAIN MANAGEMENT AND UPCOMING  SURGERY FOR CERVICAL PAIN.Marland Kitchen    Patient and/or caregiver notified and agrees to changes in the Plan of Care: Yes.     The following discharge planning was discussed with the pt/caregiver: PATIENT WILL BE DISCHARGED WHEN GOALS ARE MET AND PATIENT IS STABLE.

## 2021-12-03 NOTE — Home Health (Signed)
SUBJECTIVE: Daughter states patieint doesn well with therapy staff with attention and focus.      CAREGIVER INVOLVEMENT / ASSISTANCE NEEDED FOR: caregiver provides assistance with medications, meals, and ADLs       HOME HEALTH SUPPLIES BY TYPE AND QUANTITY ORDERED/DELIVERED THIS VISIT INCLUDE: N/A         OBJECTIVE / PATIENT RESPONSE TO TREATMENT / PATIENT LEVEL OF UNDERSTANDING OF EDUCATION PROVIDED: see interventions         ASSESSMENT OF PROGRESS TOWARD GOALS: Patient is making appropriate progress toward goals as indicated by functional short term memory task completion and problem solving. Patient will be ready for discharge on next visit.         CONTINUED NEED FOR THE FOLLOWING SKILLS: Pt presents with mild cognitive linguistic deficit.         PLAN FOR NEXT VISIT: short term memory       THE FOLLOWING DISCHARGE PLANNING WAS DISCUSSED WITH THE PATIENT/CAREGIVER: ST to d/c in 1 more visits/ wks or when goals met/max potential achieved, Pt/caregiver verbalized understanding

## 2021-12-03 NOTE — Home Health (Signed)
SUBJECTIVE: Pt reports she is tired today and hungry.  Pt reports she could not   CAREGIVER INVOLVEMENT/ASSISTANCE NEEDED FOR:  Pts dtr assist pt with all needs prn.  .  OBJECTIVE: See interventions.  PATIENT EDUCATION PROVIDED THIS VISIT: Pt instructed to continue HEP 2x/day in sitting/standing to increase LE strength to improve gait balance.  PATIENT RESPONSE TO EDUCATION PROVIDED: Pt demonstrates a good understanding of standing exercises but reports poor to fair compliance.  PATIENT RESPONSE TO TREATMENT: Pt requires much encouragement initially participating in PT secondary to wanting to eat and getting dressed to go outdoors.  Pt requires frequent rest breaks secondary to c/o fatigue.  .  ASSESSMENT OF PROGRESS TOWARD GOALS: Pt declined stair amb and outdoor amb today secondary to c/o fatigue.  Pt reports a fair compliance with HEP but demonstrates good technique.  Reviewed with pt importance of compliance with HEP.  Pts dtr reports she is working on decreasing clutter but pt has a very narrow pathway through living room.  Marland Kitchen  PLAN FOR NEXT VISIT: Will plan to attempt stair and outdoor amb again next visit.  THE FOLLOWING DISCHARGE PLANNING WAS DISCUSSED WITH THE PATIENT/CAREGIVER: Pt is scheduled to continue PT 1 more visit this week then dc next week with HEP, nsg.

## 2021-12-05 ENCOUNTER — Encounter: Admit: 2021-12-05 | Discharge: 2021-12-05 | Payer: PRIVATE HEALTH INSURANCE | Primary: Physician Assistant

## 2021-12-05 ENCOUNTER — Encounter: Payer: PRIVATE HEALTH INSURANCE | Primary: Physician Assistant

## 2021-12-05 ENCOUNTER — Other Ambulatory Visit: Payer: Self-pay | Admitting: Student

## 2021-12-05 DIAGNOSIS — I5042 Chronic combined systolic (congestive) and diastolic (congestive) heart failure: Secondary | ICD-10-CM

## 2021-12-05 NOTE — Other (Signed)
Call placed to patient, ID verified x 2. Patient  has decided with their surgeon to have cervical surgery to decrease  pain and improve mobility . Topics discussed included surgery preparation, what to expect the day of surgery, medications, physical and occupational therapy, and discharge planning.  It was discussed that this is considered an elective surgery and that prior to the surgery  decisions such as arranging for help at home once they are discharged needs to be made.Patient agreed to get home ready for surgery and to have a ride arranged to go home. Instructions were given for CHG bathing. Patient will complete the procedure the morning of surgery.  Patient was reminded not to apply any deoderant, perfumes, makeup or lotions to skin the morning of surgery. She will remain NPO after midnight the night before surgery and will take only the medications as instructed to take by her surgeon the morning of surgery with a sip of water. Per anesthesia guidelines patient was instructed to hold her lisinopril her lasix and her diabetes medications.  A spine surgery education book will be provided to patient post op prior to discharge. Education regarding the importance of early and frequent ambulation to avoid surgical complications and to assist with pain was provided. Recommended the use of ice to assist with pain and swelling post op for 20 minutes an hour, not to be placed directly on her skin. Patient verbalized understanding of all information provided.  Opportunity was given to ask questions and phone number of the Orthopaedic Program Coordinator  was given for any questions or concerns that may arise later.

## 2021-12-05 NOTE — Home Health (Signed)
SUBJECTIVE:   Patient is pleasant, nurse is present. Patient is working out details for her surgery on Monday.    CAREGIVER INVOLVEMENT / ASSISTANCE NEEDED FOR: caregivers provide assistance with medications, meals, and ADLs       HOME HEALTH SUPPLIES BY TYPE AND QUANTITY ORDERED/DELIVERED THIS VISIT INCLUDE: N/A         OBJECTIVE / PATIENT RESPONSE TO TREATMENT / PATIENT LEVEL OF UNDERSTANDING OF EDUCATION PROVIDED: see interventions         ASSESSMENT OF PROGRESS TOWARD GOALS: Pt is making appropriate progress toward goals as evidenced by functional completion of all therapy tasks. Patient has met her goals early and is ready for discharge from speech therapy. Patient benefits from cognitive stimulation and social interaction daily to maintain cognitive function. SLP discussed this with daughter and patient on 3 separate visits.         CONTINUED NEED FOR THE FOLLOWING SKILLS: discharge today         PLAN FOR NEXT VISIT: n/a       THE FOLLOWING DISCHARGE PLANNING WAS DISCUSSED WITH THE PATIENT/CAREGIVER: ST to d/c today due to goals met.

## 2021-12-05 NOTE — Home Health (Signed)
Skilled reason for visit: ASSESSMENT FOR CERVICAL MYELOPATHY AND TEACHING ON DISEASE PROCESS.    Caregiver involvement:COOKS, CLEANS AND TAKES PATIENT TO MD APPT.    Medications reviewed and all medications are available in the home this visit.    The following education was provided regarding medications:  INSTRUCTED ON IMPORTANCE OF MEDICATION COMPLIANCY..    MD notified of any discrepancies/look a-like medications/medication interactions: NA  Medications are IN HOME AND EFFECTIVE  at this time.      Home health supplies by type and quantity ordered/delivered this visit include: NA    Patient education provided this visit: TAUGHT S/S OF CERVICAL PAIN,INSTRUCTED ON MEDICATION AND DIETARY COMPLIANCY. SEE NSG INTERVENTIONS, INSTRUCTED ON WHEN TO NOTIFY MD OF MEDICAL CHANGES.  Sharps education provided: YES    Patient level of understanding of education provided:GOOD, VERBALIZED UNDERSTANDING.    Skilled Care Performed this visit:, SEE NSG INTERVENTIONS.TAUGHT DISEASE PROCESS FOR CERVICAL MYELOPAHATY, MEDICATION AND DIETARY COMPLIANCY. INSTRUCTED ON PAIN MANAGEMENT.    Patient response to procedure performed:  GOOD.      Patient's Progress towards personal goals: PROGRESSING.    Home exercise program: DEEP BREATHING EXERCISES TO HELP PREVENT PNEUMONIA.    Continued need for the following skills: Nursing.    Plan for next visit: ASSESSMENT AND TEACHING ON PAIN MANAGEMENT AND UPCOMING  SURGERY FOR CERVICAL PAIN on Monday 12-09-21.    Patient and/or caregiver notified and agrees to changes in the Plan of Care: Yes.     The following discharge planning was discussed with the pt/caregiver: PATIENT WILL BE DISCHARGED WHEN GOALS ARE MET AND PATIENT IS STABLE.

## 2021-12-06 ENCOUNTER — Encounter: Payer: PRIVATE HEALTH INSURANCE | Primary: Physician Assistant

## 2021-12-06 ENCOUNTER — Other Ambulatory Visit: Payer: Self-pay | Admitting: *Deleted

## 2021-12-06 DIAGNOSIS — I5042 Chronic combined systolic (congestive) and diastolic (congestive) heart failure: Secondary | ICD-10-CM

## 2021-12-06 NOTE — H&P (Signed)
Pre-Admission History and Physical    Patient: [name] Short   MRN: [id number]   SSN: [id number]   Date of Birth: 04/11/1956   Age: 66 y.o.   Sex: female     Patient scheduled for: cervical three, four,five and six laminectomy fusion.  Date of surgery: 12/09/2021.  Surgeon: Pamalee Leyden, MD    HPI:  [name] Short is a 66 y.o. female with progressive gait instability with frequent falls. She has numbness in her hands. She has loss of dexterity and is dropping objects. She has some neck pain. MRI demonstrates cervical spondylosis at C3/4. There is a sizable disc herniation as well as posterior ligamentous invagination causing cord compression that is severe. There is also moderately severe stenosis at 4/5, 5/6, and 6/7. CT performed a few years ago did not show any evidence of OPLL.   This patient has failed the presurgical conservative treatments  including physical therapy, spinal block injections and medications. Pain has impacted the patient's functional ability. She is being admitted for surgical intervention.         Past Medical History:   Diagnosis Date    Anemia     Arthritis     Chronic pain     Legs and Shoulders    Diabetes (HCC)     Exposure to asbestos     History of blood transfusion     History of colon polyps     HTN (hypertension)     Hx of blood clots     DVT    Hyperlipemia     Menopause     Pulmonary nodule     Serum calcium elevated     Sleep apnea     not using cpap    Stroke [facility])      Social History     Socioeconomic History    Marital status: Single     Spouse name: None    Number of children: None    Years of education: None    Highest education level: None   Tobacco Use    Smoking status: Every Day     Packs/day: 1.00     Types: Cigarettes, Cigars     Last attempt to quit: 08/2019     Years since quitting: 2.3    Smokeless tobacco: Never   Vaping Use    Vaping Use: Never used   Substance and Sexual Activity    Alcohol use: Yes     Comment: occ    Drug use: Never     Past Surgical History:    Procedure Laterality Date    CESAREAN SECTION  1987    COLONOSCOPY N/A 01/13/2017    COLONOSCOPY performed by Levan Hurst, MD at [facility]    COLONOSCOPY      DILATION AND CURETTAGE OF UTERUS  1995     Family History   Problem Relation Age of Onset    Diabetes Other 78        parent, NOS    Cancer Father     Hypertension Other 35        parent,NOS    Heart Disease Other 59        parent, NOS    Lung Disease Father      Allergies   Allergen Reactions    Lactose Other (See Comments)     Intolerance-Pt denies     No current facility-administered medications for this encounter.     Current Outpatient Medications   Medication Sig  Dispense Refill    Aspirin (VAZALORE) 81 MG CAPS Take 1 capsule by mouth      lisinopril (PRINIVIL;ZESTRIL) 40 MG tablet Take 40 mg by mouth daily.      acetaminophen (TYLENOL) 500 MG tablet Take 500 mg by mouth every 4 hours as needed for Fever or Pain.      polyethylene glycol (GLYCOLAX) 17 g packet Take 17 g by mouth daily as needed for Constipation.      psyllium 0.52 g capsule Take 0.52 g by mouth daily as needed (constipation). 1st line of defense (Patient not taking: Reported on 11/28/2021)      ascorbic acid (VITAMIN C) 250 MG tablet Take 250 mg by mouth daily      clopidogrel (PLAVIX) 75 MG tablet Take 1 tablet by mouth daily      Continuous Blood Gluc Sensor (FREESTYLE LIBRE 3 SENSOR) MISC MISCELLANEOUS CHANGE NEW SENSOR EVERY 14 DAYS (Patient not taking: Reported on 11/06/2021)      cyclobenzaprine (FLEXERIL) 5 MG tablet Take 5 mg by mouth daily as needed (Patient not taking: Reported on 11/28/2021)      BYDUREON BCISE 2 MG/0.85ML injection every 7 days      ferrous sulfate (IRON 325) 325 (65 Fe) MG tablet Take 325 mg by mouth daily      furosemide (LASIX) 40 MG tablet furosemide 40 mg tablet   TAKE 1 TABLET BY MOUTH EVERY DAY      gabapentin (NEURONTIN) 100 MG capsule Take 1 capsule by mouth as needed.      Glucagon, rDNA, (GLUCAGON EMERGENCY) 1 MG KIT INJECT 1 MILLIGRAM(S)  INJECTION EVERY DAY AS NEEDED FOR LOW BLOOD DUGAR      HUMALOG MIX 75/25 KWIKPEN (75-25) 100 UNIT/ML SUPN injection pen Inject 17 Units into the skin 2 times daily (with meals)      pantoprazole (PROTONIX) 40 MG tablet Take 40 mg by mouth daily as needed (acid reflux, heartburn)         ROS:  Denies chills, fever,night sweats,  bowel or bladder dysfunction, unexplained weight loss/weight gain, chest pain, sob or anxiety.    Physical Examination    Gen: Well developed, well nourished 66 y.o. female with weak intrinsics. Normal strength in her biceps, triceps and deltoids. Antalgic ROM of her cervical spine. Positive hoffmans. Bilaterally hyperreflexic achilles and patella. Normal biceps and triceps. No clonus, no babinski. She is wheelchair bound. Her balance is poor.     Assessment and Plan    Due to the pt's persistent symptoms unrelieved by conservative measure Lulamae Thielbar is being admitted to undergo surgical intervention. The post-operative plan of care consists of physical therapy, home health and a 2 week f/u office visit.  The risks, benefits, complications and alternatives to surgery have been discussed in detail with the patient.  The patient understands and agrees to proceed.

## 2021-12-08 NOTE — Home Health (Signed)
SUBJECTIVE: Pt reports she is tired today and hungry again today.  Pt reports she is anxious to get her surgery done Monday but not sure if it will be post poned secondary to dr being ill this week.  CAREGIVER INVOLVEMENT/ASSISTANCE NEEDED FOR:  Pts dtr assist pt with all needs prn.  .  OBJECTIVE: See interventions.  PATIENT EDUCATION PROVIDED THIS VISIT: Pt instructed to continue HEP 2x/day in sitting/standing to increase LE strength to improve gait balance.  PATIENT RESPONSE TO EDUCATION PROVIDED: Pt demonstrates a good understanding of standing exercises but reports poor to fair compliance.  PATIENT RESPONSE TO TREATMENT: Pt requires frequent rest breaks throughout PT session secondary to c/o fatigue.  .  ASSESSMENT OF PROGRESS TOWARD GOALS: Pt requires frequent rest breaks throughout PT session secondary to c/o fatigue.  Pt did agree to amb outdoors and up/down steps today with minimal difficulty using one hand rail and cane.  Reviewed with pt importance of compliance with HEP.  Marland Kitchen  PLAN FOR NEXT VISIT: Will plan for reassessment after pts surgery next week.  THE FOLLOWING DISCHARGE PLANNING WAS DISCUSSED WITH THE PATIENT/CAREGIVER: Pt is scheduled for reassessment next week after pts surgery.

## 2021-12-09 ENCOUNTER — Inpatient Hospital Stay
Admit: 2021-12-09 | Discharge: 2021-12-11 | Disposition: A | Discharge status: Home Health | Payer: PRIVATE HEALTH INSURANCE | Attending: Orthopaedic Surgery | Admitting: Orthopaedic Surgery

## 2021-12-09 ENCOUNTER — Observation Stay: Admit: 2021-12-09 | Payer: PRIVATE HEALTH INSURANCE | Primary: Physician Assistant

## 2021-12-09 ENCOUNTER — Encounter: Payer: PRIVATE HEALTH INSURANCE | Primary: Registered Nurse

## 2021-12-09 ENCOUNTER — Other Ambulatory Visit: Payer: Self-pay | Admitting: Student

## 2021-12-09 DIAGNOSIS — M4712 Other spondylosis with myelopathy, cervical region: Secondary | ICD-10-CM

## 2021-12-09 DIAGNOSIS — I5042 Chronic combined systolic (congestive) and diastolic (congestive) heart failure: Secondary | ICD-10-CM

## 2021-12-09 LAB — POCT GLUCOSE
POC Glucose: 204 mg/dL — ABNORMAL HIGH (ref 70–110)
POC Glucose: 167 mg/dL — ABNORMAL HIGH (ref 70–110)
POC Glucose: 239 mg/dL — ABNORMAL HIGH (ref 70–110)

## 2021-12-09 LAB — HEMOGLOBIN A1C
Estimated Avg Glucose: 169 mg/dL
Hemoglobin A1C: 7.5 % — ABNORMAL HIGH (ref 4.2–5.6)

## 2021-12-09 MED ORDER — DEXAMETHASONE SODIUM PHOSPHATE 4 MG/ML IJ SOLN
4 MG/ML | INTRAMUSCULAR | Status: AC
Start: 2021-12-09 — End: ?

## 2021-12-09 MED ORDER — CEFAZOLIN SODIUM 1 G IJ SOLR
1 g | INTRAMUSCULAR | Status: DC | PRN
Start: 2021-12-09 — End: 2021-12-09
  Administered 2021-12-09: 14:00:00 2 via INTRAVENOUS

## 2021-12-09 MED ORDER — BUPIVACAINE HCL (PF) 0.5 % IJ SOLN
0.5 % | INTRAMUSCULAR | Status: AC
Start: 2021-12-09 — End: ?

## 2021-12-09 MED ORDER — CEFAZOLIN SODIUM 1 G IJ SOLR
1 g | Freq: Three times a day (TID) | INTRAMUSCULAR | Status: AC
Start: 2021-12-09 — End: 2021-12-10
  Administered 2021-12-09 – 2021-12-10 (×2): 2000 mg via INTRAVENOUS

## 2021-12-09 MED ORDER — ONDANSETRON HCL 4 MG/2ML IJ SOLN
4 MG/2ML | Freq: Once | INTRAMUSCULAR | Status: AC | PRN
Start: 2021-12-09 — End: 2021-12-09
  Administered 2021-12-09: 17:00:00 4 mg via INTRAVENOUS

## 2021-12-09 MED ORDER — BUPIVACAINE HCL (PF) 0.5 % IJ SOLN
0.5 % | INTRAMUSCULAR | Status: DC | PRN
Start: 2021-12-09 — End: 2021-12-09
  Administered 2021-12-09: 16:00:00 20 via SUBCUTANEOUS

## 2021-12-09 MED ORDER — ESMOLOL HCL 100 MG/10ML IV SOLN
100 MG/10ML | INTRAVENOUS | Status: DC | PRN
Start: 2021-12-09 — End: 2021-12-09
  Administered 2021-12-09: 14:00:00 20 via INTRAVENOUS

## 2021-12-09 MED ORDER — GLYCOPYRROLATE 0.4 MG/2ML IJ SOLN
0.4 MG/2ML | INTRAMUSCULAR | Status: DC | PRN
Start: 2021-12-09 — End: 2021-12-09
  Administered 2021-12-09: 14:00:00 .2 via INTRAVENOUS
  Administered 2021-12-09: 15:00:00 .4 via INTRAVENOUS

## 2021-12-09 MED ORDER — PROPOFOL 200 MG/20ML IV EMUL
200 MG/20ML | INTRAVENOUS | Status: DC | PRN
Start: 2021-12-09 — End: 2021-12-09
  Administered 2021-12-09: 14:00:00 150 via INTRAVENOUS

## 2021-12-09 MED ORDER — GLUCOSE 4 G PO CHEW
4 g | ORAL | Status: AC | PRN
Start: 2021-12-09 — End: 2021-12-11

## 2021-12-09 MED ORDER — SUCCINYLCHOLINE CHLORIDE 100 MG/5ML IV SOSY
100 MG/5ML | INTRAVENOUS | Status: DC | PRN
Start: 2021-12-09 — End: 2021-12-09
  Administered 2021-12-09 (×2): 140 via INTRAVENOUS

## 2021-12-09 MED ORDER — ONDANSETRON 4 MG PO TBDP
4 MG | Freq: Three times a day (TID) | ORAL | Status: AC | PRN
Start: 2021-12-09 — End: 2021-12-11

## 2021-12-09 MED ORDER — INSULIN LISPRO 100 UNIT/ML IJ SOLN
100 UNIT/ML | Freq: Three times a day (TID) | INTRAMUSCULAR | Status: AC
Start: 2021-12-09 — End: 2021-12-11
  Administered 2021-12-09: 22:00:00 2 [IU] via SUBCUTANEOUS
  Administered 2021-12-10 (×3): 8 [IU] via SUBCUTANEOUS
  Administered 2021-12-11: 17:00:00 6 [IU] via SUBCUTANEOUS

## 2021-12-09 MED ORDER — SODIUM CHLORIDE 0.9 % IV SOLN
0.9 % | INTRAVENOUS | Status: DC | PRN
Start: 2021-12-09 — End: 2021-12-09
  Administered 2021-12-09: 15:00:00 100 via INTRAVENOUS
  Administered 2021-12-09: 15:00:00 200 via INTRAVENOUS
  Administered 2021-12-09: 15:00:00 100 via INTRAVENOUS

## 2021-12-09 MED ORDER — SUCCINYLCHOLINE CHLORIDE 100 MG/5ML IV SOSY
100 MG/5ML | INTRAVENOUS | Status: AC
Start: 2021-12-09 — End: ?

## 2021-12-09 MED ORDER — LACTATED RINGERS IV SOLN
INTRAVENOUS | Status: AC
Start: 2021-12-09 — End: 2021-12-10

## 2021-12-09 MED ORDER — SODIUM CHLORIDE 0.45 % IV SOLN
0.45 % | INTRAVENOUS | Status: AC
Start: 2021-12-09 — End: 2021-12-11
  Administered 2021-12-09 – 2021-12-10 (×2): via INTRAVENOUS

## 2021-12-09 MED ORDER — NEOSTIGMINE METHYLSULFATE 3 MG/3ML IV SOSY
3 MG/ML | INTRAVENOUS | Status: DC | PRN
Start: 2021-12-09 — End: 2021-12-09
  Administered 2021-12-09: 15:00:00 3 via INTRAVENOUS

## 2021-12-09 MED ORDER — FAMOTIDINE 20 MG PO TABS
20 MG | Freq: Two times a day (BID) | ORAL | Status: DC
Start: 2021-12-09 — End: 2021-12-10
  Administered 2021-12-10 (×2): 20 mg via ORAL

## 2021-12-09 MED ORDER — DEXTROSE 10 % IV SOLN
10 % | INTRAVENOUS | Status: DC | PRN
Start: 2021-12-09 — End: 2021-12-09

## 2021-12-09 MED ORDER — ONDANSETRON HCL 4 MG/2ML IJ SOLN
4 MG/2ML | Freq: Four times a day (QID) | INTRAMUSCULAR | Status: AC | PRN
Start: 2021-12-09 — End: 2021-12-11

## 2021-12-09 MED ORDER — BENZOCAINE-MENTHOL 15-3.6 MG MT LOZG
OROMUCOSAL | Status: AC | PRN
Start: 2021-12-09 — End: 2021-12-11

## 2021-12-09 MED ORDER — ONDANSETRON HCL 4 MG/2ML IJ SOLN
4 MG/2ML | INTRAMUSCULAR | Status: AC
Start: 2021-12-09 — End: ?

## 2021-12-09 MED ORDER — CELECOXIB 400 MG PO CAPS
400 MG | Freq: Once | ORAL | Status: AC
Start: 2021-12-09 — End: 2021-12-09
  Administered 2021-12-09: 13:00:00 400 mg via ORAL

## 2021-12-09 MED ORDER — LACTATED RINGERS IV SOLN
INTRAVENOUS | Status: DC
Start: 2021-12-09 — End: 2021-12-09
  Administered 2021-12-09: 13:00:00 via INTRAVENOUS

## 2021-12-09 MED ORDER — DEXTROSE 10 % IV BOLUS
INTRAVENOUS | Status: AC | PRN
Start: 2021-12-09 — End: 2021-12-11

## 2021-12-09 MED ORDER — THROMBIN 5000 UNITS EX SOLR
5000 units | CUTANEOUS | Status: DC | PRN
Start: 2021-12-09 — End: 2021-12-09
  Administered 2021-12-09: 14:00:00 10000 via TOPICAL

## 2021-12-09 MED ORDER — NORMAL SALINE FLUSH 0.9 % IV SOLN
0.9 % | INTRAVENOUS | Status: DC | PRN
Start: 2021-12-09 — End: 2021-12-09

## 2021-12-09 MED ORDER — NEOSTIGMINE METHYLSULFATE 3 MG/3ML IV SOSY
3 MG/ML | INTRAVENOUS | Status: AC
Start: 2021-12-09 — End: ?

## 2021-12-09 MED ORDER — THROMBIN 5000 UNITS EX SOLR
5000 units | CUTANEOUS | Status: AC
Start: 2021-12-09 — End: ?

## 2021-12-09 MED ORDER — OXYCODONE HCL 5 MG PO TABS
5 MG | ORAL | Status: AC | PRN
Start: 2021-12-09 — End: 2021-12-11

## 2021-12-09 MED ORDER — PROCHLORPERAZINE EDISYLATE 10 MG/2ML IJ SOLN
10 MG/2ML | Freq: Once | INTRAMUSCULAR | Status: DC | PRN
Start: 2021-12-09 — End: 2021-12-09

## 2021-12-09 MED ORDER — METAXALONE 800 MG PO TABS
800 MG | Freq: Three times a day (TID) | ORAL | Status: AC | PRN
Start: 2021-12-09 — End: 2021-12-11

## 2021-12-09 MED ORDER — POLYETHYLENE GLYCOL 3350 17 G PO PACK
17 g | Freq: Every day | ORAL | Status: AC
Start: 2021-12-09 — End: 2021-12-11
  Administered 2021-12-09 – 2021-12-10 (×2): 17 g via ORAL

## 2021-12-09 MED ORDER — HYDROMORPHONE 0.5MG/0.5ML IJ SOLN
1 MG/ML | Status: DC | PRN
Start: 2021-12-09 — End: 2021-12-11

## 2021-12-09 MED ORDER — FENTANYL CITRATE (PF) 100 MCG/2ML IJ SOLN
100 MCG/2ML | INTRAMUSCULAR | Status: AC
Start: 2021-12-09 — End: ?

## 2021-12-09 MED ORDER — TAMSULOSIN HCL 0.4 MG PO CAPS
0.4 MG | Freq: Every day | ORAL | Status: AC
Start: 2021-12-09 — End: 2021-12-11
  Administered 2021-12-09 – 2021-12-10 (×2): 0.4 mg via ORAL

## 2021-12-09 MED ORDER — LIDOCAINE HCL (PF) 2 % IJ SOLN
2 % | INTRAMUSCULAR | Status: DC | PRN
Start: 2021-12-09 — End: 2021-12-09
  Administered 2021-12-09: 14:00:00 80 via INTRAVENOUS

## 2021-12-09 MED ORDER — DIPHENHYDRAMINE HCL 25 MG PO CAPS
25 MG | Freq: Four times a day (QID) | ORAL | Status: AC | PRN
Start: 2021-12-09 — End: 2021-12-11

## 2021-12-09 MED ORDER — PHENYLEPHRINE HCL 10 MG/ML IV SOLN
10 MG/ML | INTRAVENOUS | Status: AC
Start: 2021-12-09 — End: ?

## 2021-12-09 MED ORDER — LIDOCAINE HCL (PF) 2 % IJ SOLN
2 % | INTRAMUSCULAR | Status: AC
Start: 2021-12-09 — End: ?

## 2021-12-09 MED ORDER — DEXTROSE 10 % IV SOLN
10 % | INTRAVENOUS | Status: AC | PRN
Start: 2021-12-09 — End: 2021-12-11

## 2021-12-09 MED ORDER — SODIUM CHLORIDE 0.9 % IV SOLN
0.9 % | INTRAVENOUS | Status: DC | PRN
Start: 2021-12-09 — End: 2021-12-09

## 2021-12-09 MED ORDER — DEXTROSE 10 % IV BOLUS
INTRAVENOUS | Status: DC | PRN
Start: 2021-12-09 — End: 2021-12-09

## 2021-12-09 MED ORDER — PREGABALIN 75 MG PO CAPS
75 MG | Freq: Once | ORAL | Status: AC
Start: 2021-12-09 — End: 2021-12-09
  Administered 2021-12-09: 13:00:00 75 mg via ORAL

## 2021-12-09 MED ORDER — ESMOLOL HCL 100 MG/10ML IV SOLN
100 MG/10ML | INTRAVENOUS | Status: AC
Start: 2021-12-09 — End: ?

## 2021-12-09 MED ORDER — FENTANYL CITRATE (PF) 100 MCG/2ML IJ SOLN
100 MCG/2ML | INTRAMUSCULAR | Status: DC | PRN
Start: 2021-12-09 — End: 2021-12-09
  Administered 2021-12-09 (×2): 50 via INTRAVENOUS

## 2021-12-09 MED ORDER — GLYCOPYRROLATE 0.4 MG/2ML IJ SOLN
0.4 MG/2ML | INTRAMUSCULAR | Status: AC
Start: 2021-12-09 — End: ?

## 2021-12-09 MED ORDER — LIDOCAINE HCL (PF) 1 % IJ SOLN
1 % | Freq: Once | INTRAMUSCULAR | Status: DC | PRN
Start: 2021-12-09 — End: 2021-12-09

## 2021-12-09 MED ORDER — LISINOPRIL 40 MG PO TABS
40 MG | Freq: Every day | ORAL | Status: AC
Start: 2021-12-09 — End: 2021-12-11
  Administered 2021-12-10 – 2021-12-11 (×2): 40 mg via ORAL

## 2021-12-09 MED ORDER — BISACODYL 5 MG PO TBEC
5 MG | Freq: Every day | ORAL | Status: AC
Start: 2021-12-09 — End: 2021-12-11

## 2021-12-09 MED ORDER — SODIUM CHLORIDE (PF) 0.9 % IJ SOLN
0.9 % | Freq: Two times a day (BID) | INTRAMUSCULAR | Status: AC
Start: 2021-12-09 — End: 2021-12-10

## 2021-12-09 MED ORDER — VANCOMYCIN HCL 1 G IV SOLR
1 g | INTRAVENOUS | Status: AC
Start: 2021-12-09 — End: ?

## 2021-12-09 MED ORDER — ROCURONIUM BROMIDE 50 MG/5ML IV SOLN
50 MG/5ML | INTRAVENOUS | Status: DC | PRN
Start: 2021-12-09 — End: 2021-12-09
  Administered 2021-12-09: 14:00:00 10 via INTRAVENOUS

## 2021-12-09 MED ORDER — FAMOTIDINE 20 MG PO TABS
20 MG | Freq: Once | ORAL | Status: AC
Start: 2021-12-09 — End: 2021-12-09
  Administered 2021-12-09: 13:00:00 20 mg via ORAL

## 2021-12-09 MED ORDER — NORMAL SALINE FLUSH 0.9 % IV SOLN
0.9 % | Freq: Two times a day (BID) | INTRAVENOUS | Status: DC
Start: 2021-12-09 — End: 2021-12-09

## 2021-12-09 MED ORDER — GELATIN ABSORBABLE 100 EX MISC
100 | CUTANEOUS | Status: DC | PRN
Start: 2021-12-09 — End: 2021-12-09
  Administered 2021-12-09: 14:00:00 1 via TOPICAL

## 2021-12-09 MED ORDER — DEXAMETHASONE SODIUM PHOSPHATE 4 MG/ML IJ SOLN
4 MG/ML | INTRAMUSCULAR | Status: DC | PRN
Start: 2021-12-09 — End: 2021-12-09
  Administered 2021-12-09: 14:00:00 10 via INTRAVENOUS

## 2021-12-09 MED ORDER — ACETAMINOPHEN 500 MG PO TABS
500 MG | Freq: Once | ORAL | Status: AC
Start: 2021-12-09 — End: 2021-12-09
  Administered 2021-12-09: 13:00:00 1000 mg via ORAL

## 2021-12-09 MED ORDER — SODIUM CHLORIDE 0.9 % IV SOLN (MINI-BAG)
0.9 % | Freq: Once | INTRAVENOUS | Status: AC
Start: 2021-12-09 — End: 2021-12-09
  Administered 2021-12-09: 15:00:00 1000 mg via INTRAVENOUS

## 2021-12-09 MED ORDER — ACETAMINOPHEN 325 MG PO TABS
325 MG | Freq: Four times a day (QID) | ORAL | Status: AC
Start: 2021-12-09 — End: 2021-12-11
  Administered 2021-12-09 – 2021-12-11 (×7): 650 mg via ORAL

## 2021-12-09 MED ORDER — INSULIN LISPRO 100 UNIT/ML IJ SOLN
100 UNIT/ML | Freq: Once | INTRAMUSCULAR | Status: AC
Start: 2021-12-09 — End: 2021-12-09
  Administered 2021-12-09: 14:00:00 6 [IU] via SUBCUTANEOUS

## 2021-12-09 MED ORDER — DEXMEDETOMIDINE HCL 200 MCG/2ML IV SOLN
2002 MCG/2ML | INTRAVENOUS | Status: DC | PRN
Start: 2021-12-09 — End: 2021-12-09
  Administered 2021-12-09: 14:00:00 10 via INTRAVENOUS
  Administered 2021-12-09: 14:00:00 4 via INTRAVENOUS

## 2021-12-09 MED ORDER — TRANEXAMIC ACID 1000 MG/10ML IV SOLN
1000 MG/10ML | INTRAVENOUS | Status: AC
Start: 2021-12-09 — End: ?

## 2021-12-09 MED ORDER — GLUCAGON HCL RDNA (DIAGNOSTIC) 1 MG IJ SOLR
1 MG | INTRAMUSCULAR | Status: AC | PRN
Start: 2021-12-09 — End: 2021-12-11

## 2021-12-09 MED ORDER — GELATIN ABSORBABLE 100 CM EX MISC
100 CM | CUTANEOUS | Status: AC
Start: 2021-12-09 — End: ?

## 2021-12-09 MED ORDER — GLUCOSE 4 G PO CHEW
4 g | ORAL | Status: DC | PRN
Start: 2021-12-09 — End: 2021-12-09

## 2021-12-09 MED ORDER — GLUCAGON HCL RDNA (DIAGNOSTIC) 1 MG IJ SOLR
1 MG | INTRAMUSCULAR | Status: DC | PRN
Start: 2021-12-09 — End: 2021-12-09

## 2021-12-09 MED ORDER — NORMAL SALINE FLUSH 0.9 % IV SOLN
0.9 % | Freq: Two times a day (BID) | INTRAVENOUS | Status: AC
Start: 2021-12-09 — End: 2021-12-11
  Administered 2021-12-10 – 2021-12-11 (×3): 10 mL via INTRAVENOUS

## 2021-12-09 MED ORDER — PROPOFOL 200 MG/20ML IV EMUL
200 MG/20ML | INTRAVENOUS | Status: AC
Start: 2021-12-09 — End: ?

## 2021-12-09 MED ORDER — ONDANSETRON HCL 4 MG/2ML IJ SOLN
4 MG/2ML | INTRAMUSCULAR | Status: DC | PRN
Start: 2021-12-09 — End: 2021-12-09
  Administered 2021-12-09: 15:00:00 4 via INTRAVENOUS

## 2021-12-09 MED ORDER — INSULIN LISPRO 100 UNIT/ML IJ SOLN
100 UNIT/ML | Freq: Every evening | INTRAMUSCULAR | Status: AC
Start: 2021-12-09 — End: 2021-12-11
  Administered 2021-12-11: 02:00:00 4 [IU] via SUBCUTANEOUS

## 2021-12-09 MED ORDER — DIPHENHYDRAMINE HCL 50 MG/ML IJ SOLN
50 MG/ML | Freq: Four times a day (QID) | INTRAMUSCULAR | Status: AC | PRN
Start: 2021-12-09 — End: 2021-12-11

## 2021-12-09 MED ORDER — HYDROMORPHONE 0.5MG/0.5ML IJ SOLN
1 MG/ML | Status: DC | PRN
Start: 2021-12-09 — End: 2021-12-09
  Administered 2021-12-09 (×2): 0.5 mg via INTRAVENOUS

## 2021-12-09 MED ORDER — ROCURONIUM BROMIDE 50 MG/5ML IV SOLN
50 MG/5ML | INTRAVENOUS | Status: AC
Start: 2021-12-09 — End: ?

## 2021-12-09 MED ORDER — VANCOMYCIN HCL 1 G IV SOLR
1 g | INTRAVENOUS | Status: DC | PRN
Start: 2021-12-09 — End: 2021-12-09
  Administered 2021-12-09: 15:00:00 1000 via TOPICAL

## 2021-12-09 MED ORDER — OXYCODONE HCL 5 MG PO TABS
5 | ORAL | Status: DC | PRN
Start: 2021-12-09 — End: 2021-12-11
  Administered 2021-12-09 – 2021-12-11 (×6): 5 mg via ORAL

## 2021-12-09 MED ORDER — FENTANYL CITRATE (PF) 100 MCG/2ML IJ SOLN
100 | INTRAMUSCULAR | Status: DC | PRN
Start: 2021-12-09 — End: 2021-12-09
  Administered 2021-12-09: 16:00:00 25 ug via INTRAVENOUS

## 2021-12-09 MED ORDER — HYDROMORPHONE 0.5MG/0.5ML IJ SOLN
1 | Status: DC | PRN
Start: 2021-12-09 — End: 2021-12-11

## 2021-12-09 MED ORDER — CEFAZOLIN SODIUM 1 G IJ SOLR
1 g | Freq: Once | INTRAMUSCULAR | Status: DC
Start: 2021-12-09 — End: 2021-12-09

## 2021-12-09 MED FILL — PREGABALIN 75 MG PO CAPS: 75 MG | ORAL | Qty: 1

## 2021-12-09 MED FILL — BD POSIFLUSH 0.9 % IV SOLN: 0.9 % | INTRAVENOUS | Qty: 40

## 2021-12-09 MED FILL — FAMOTIDINE 20 MG PO TABS: 20 MG | ORAL | Qty: 1

## 2021-12-09 MED FILL — LACTATED RINGERS IV SOLN: INTRAVENOUS | Qty: 1000

## 2021-12-09 MED FILL — GLYCOPYRROLATE 0.4 MG/2ML IJ SOLN: 0.4 MG/2ML | INTRAMUSCULAR | Qty: 2

## 2021-12-09 MED FILL — ACETAMINOPHEN EXTRA STRENGTH 500 MG PO TABS: 500 MG | ORAL | Qty: 2

## 2021-12-09 MED FILL — NEOSTIGMINE METHYLSULFATE 3 MG/3ML IV SOSY: 3 MG/ML | INTRAVENOUS | Qty: 3

## 2021-12-09 MED FILL — PHENYLEPHRINE HCL (PRESSORS) 10 MG/ML IV SOLN: 10 MG/ML | INTRAVENOUS | Qty: 1

## 2021-12-09 MED FILL — CEFAZOLIN SODIUM 1 G IJ SOLR: 1 g | INTRAMUSCULAR | Qty: 2000

## 2021-12-09 MED FILL — DILAUDID 1 MG/ML IJ SOLN: 1 MG/ML | INTRAMUSCULAR | Qty: 0.5

## 2021-12-09 MED FILL — ACETAMINOPHEN 325 MG PO TABS: 325 MG | ORAL | Qty: 2

## 2021-12-09 MED FILL — DIPRIVAN 200 MG/20ML IV EMUL: 200 MG/20ML | INTRAVENOUS | Qty: 20

## 2021-12-09 MED FILL — POLYETHYLENE GLYCOL 3350 17 G PO PACK: 17 g | ORAL | Qty: 1

## 2021-12-09 MED FILL — VANCOMYCIN HCL 1 G IV SOLR: 1 g | INTRAVENOUS | Qty: 1000

## 2021-12-09 MED FILL — SENSORCAINE-MPF 0.5 % IJ SOLN: 0.5 % | INTRAMUSCULAR | Qty: 30

## 2021-12-09 MED FILL — SODIUM CHLORIDE 0.45 % IV SOLN: 0.45 % | INTRAVENOUS | Qty: 1000

## 2021-12-09 MED FILL — ONDANSETRON HCL 4 MG/2ML IJ SOLN: 4 MG/2ML | INTRAMUSCULAR | Qty: 2

## 2021-12-09 MED FILL — FENTANYL CITRATE (PF) 100 MCG/2ML IJ SOLN: 100 MCG/2ML | INTRAMUSCULAR | Qty: 2

## 2021-12-09 MED FILL — HUMALOG 100 UNIT/ML IJ SOLN: 100 UNIT/ML | INTRAMUSCULAR | Qty: 1

## 2021-12-09 MED FILL — SUCCINYLCHOLINE CHLORIDE 100 MG/5ML IV SOSY: 100 MG/5ML | INTRAVENOUS | Qty: 10

## 2021-12-09 MED FILL — CELECOXIB 400 MG PO CAPS: 400 MG | ORAL | Qty: 1

## 2021-12-09 MED FILL — ESMOLOL HCL 100 MG/10ML IV SOLN: 100 MG/10ML | INTRAVENOUS | Qty: 10

## 2021-12-09 MED FILL — GELFOAM COMPRESSED SIZE 100 EX MISC: CUTANEOUS | Qty: 6

## 2021-12-09 MED FILL — ROCURONIUM BROMIDE 50 MG/5ML IV SOLN: 50 MG/5ML | INTRAVENOUS | Qty: 5

## 2021-12-09 MED FILL — XYLOCAINE-MPF 2 % IJ SOLN: 2 % | INTRAMUSCULAR | Qty: 5

## 2021-12-09 MED FILL — OXYCODONE HCL 5 MG PO TABS: 5 MG | ORAL | Qty: 1

## 2021-12-09 MED FILL — TRANEXAMIC ACID 1000 MG/10ML IV SOLN: 1000 MG/10ML | INTRAVENOUS | Qty: 10

## 2021-12-09 MED FILL — DEXAMETHASONE SODIUM PHOSPHATE 4 MG/ML IJ SOLN: 4 MG/ML | INTRAMUSCULAR | Qty: 2

## 2021-12-09 MED FILL — TAMSULOSIN HCL 0.4 MG PO CAPS: 0.4 MG | ORAL | Qty: 1

## 2021-12-09 MED FILL — THROMBIN-JMI 5000 UNITS EX SOLR: 5000 units | CUTANEOUS | Qty: 10000

## 2021-12-09 NOTE — Discharge Instructions (Addendum)
 Dr. Overton Blotter Post-Operative Instructions: Cervical Surgery    DO NOT take any NSAIDS if you had Fusion Surgery.     ACTIVITIES:  Change positions every hour while you are awake.  Walking is the best way to rebuild strength.   Wear your soft collar just for comfort if it is provided to you.  You may remove if desired.   Sleep with your head elevated for 2-3 nights after surgery to prevent swelling.   Avoid strenuous activity, such as yard work, vacuuming, or lifting anything heavier than a gallon of milk.   Avoid strenuous activities, such as vacuuming, and do not lift anything heavier than 1 gallon of milk (or about 5-8 pounds).   Walk at a pace that avoids fatigue or severe pain. Do not try to walk several blocks the first day!   Follow-up with Dr. Darice Edelman will be 7-10 days from surgery.    BATHING and INCISION CARE:  The incision may be tender or feel numb: this is normal.   Keep the incision clean and dry. You may shower 3 days after surgery. Cover the dressing with saran wrap before getting in the shower. The incision is closed with sutures under the skin and glue on top.   Do not apply any lotions, ointments or oils on the incision.   Do not remove the dressing. Your dressing will be changed at your first post op appointment. If it comes loose or is damaged, dirty or wet before this appointment, call your home health nurse (if you are being seen by a nurse at home) or the office to have the dressing changed.  If you notice any excessive swelling, redness, or persistent drainage around the incision, notify the office immediately.    CONSTIPATION:  Take a stool softener twice a day while you are taking a narcotic.   If you have not had a bowel movement within 3 days of surgery, you will need to use a laxative or suppository that can be obtained over the counter at your local pharmacy     ICE  Use ice on your neck and shoulders to decrease pain and swelling.  Do NOT use heat.    MEDICATIONS:  If you had  fusion surgery DO NOT TAKE non-steroidal anti-inflammatory (NSAID) medications, such as Motrin, Aleve, Advil Naprosyn, Ibuprofen or aspirin .   Take Tylenol /Acetaminophen  every 4-6 hours for pain. Do not take more than 3000 mg each day. (Do not take Tylenol /Acetaminophen  if you have liver problems).  Take your prescribed narcotic pain medication as needed for pain that is not tolerable.    Eat food before you take any pain medication to avoid nausea.    If you need a medication refill, please call the office during working hours at least 2 days before your prescription runs out. Do not wait until your bottle is empty to call for a refill.           EATING & NUTRITION:  You can eat anything you can chew and make soft.  Take small bites and avoid tough foods like meat and bread.    Painful swallowing is normal after surgery and may be experienced up to 2 weeks post-operatively.   You may obtain over the counter Chloraseptic spray.  The more you swallow the better your throat will feel.     If at any point you can no longer swallow your own saliva, call Dr. Overton Blotter office right away.    Eat a healthy balanced diet to  help your wound to heal. Protein supplements should be considered if you are eating less than 50% of your meal.   Drink plenty of water  to stay hydrated.    DRIVING & RETURN TO WORK:   You will be told at your follow up appointment if it is safe for you to drive or return to work and will be provided with a return-to-work-note if needed (please ask).   NEVER drive while taking narcotic medication.    WHEN TO CALL THE OFFICE:  If you have severe pain unrelieved by the medications, new numbness or tingling in your legs;       If you have a fever of 101.87F or greater   If you notice increased swelling, redness, or increased drainage from the incision    If you are not able to urinate  If you are not able to control your bowels   If you are unable to swallow your own saliva      [facility]  number is [phone].  They are open from 8:00am to 5:00pm Mon - Fri. After 5:00pm, or on weekends/holidays, please call the answering service at [phone] for a call back.   DISCHARGE SUMMARY from Nurse    PATIENT INSTRUCTIONS:    After general anesthesia or intravenous sedation, for 24 hours or while taking prescription Narcotics:  Limit your activities  Do not drive and operate hazardous machinery  Do not make important personal or business decisions  Do  not drink alcoholic beverages  If you have not urinated within 8 hours after discharge, please contact your surgeon on call.    Report the following to your surgeon:  Excessive pain, swelling, redness or odor of or around the surgical area  Temperature over 100.5  Nausea and vomiting lasting longer than 4 hours or if unable to take medications  Any signs of decreased circulation or nerve impairment to extremity: change in color, persistent  numbness, tingling, coldness or increase pain  Any questions    What to do at Home:  Recommended activity: activity as tolerated,     If you experience any of the following symptoms chest pain, fever greater than 100.5, swelling or difficulty swallowing, pain unrelieved by medication , please follow up with Dr Darice Edelman.    *  Please give a list of your current medications to your Primary Care Provider.    *  Please update this list whenever your medications are discontinued, doses are      changed, or new medications (including over-the-counter products) are added.    *  Please carry medication information at all times in case of emergency situations.    These are general instructions for a healthy lifestyle:    No smoking/ No tobacco products/ Avoid exposure to second hand smoke  Surgeon General's Warning:  Quitting smoking now greatly reduces serious risk to your health.    Obesity, smoking, and sedentary lifestyle greatly increases your risk for illness    A healthy diet, regular physical exercise & weight monitoring  are important for maintaining a healthy lifestyle    You may be retaining fluid if you have a history of heart failure or if you experience any of the following symptoms:  Weight gain of 3 pounds or more overnight or 5 pounds in a week, increased swelling in our hands or feet or shortness of breath while lying flat in bed.  Please call your doctor as soon as you notice any of these symptoms; do  not wait until your next office visit.  Patient armband removed and shredded   Thank you for enrolling in MyChart. Please follow the instructions below to securely access your online medical record. MyChart allows you to send messages to your doctor, view your test results, renew your prescriptions, schedule appointments, and more.     How Do I Sign Up?  In your Internet browser, go to [email or web address]  Click on the Sign Up Now link in the Sign In box. You will see the New Member Sign Up page.  Enter your MyChart Access Code exactly as it appears below. You will not need to use this code after you've completed the sign-up process. If you do not sign up before the expiration date, you must request a new code.  MyChart Access Code: Activation code not generated  Current MyChart Status: Active    Enter your Social Security Number ([id number]) and Date of Birth (mm/dd/yyyy) as indicated and click Submit. You will be taken to the next sign-up page.  Create a MyChart ID. This will be your MyChart login ID and cannot be changed, so think of one that is secure and easy to remember.  Create a Clinical biochemist. You can change your password at any time.  Enter your Password Reset Question and Answer. This can be used at a later time if you forget your password.   Enter your e-mail address. You will receive e-mail notification when new information is available in MyChart.  Click Sign Up. You can now view your medical record.     Additional Information  If you have questions, please contact your physician  practice where you receive care. Remember, MyChart is NOT to be used for urgent needs. For medical emergencies, dial 911.     The discharge information has been reviewed with the {PATIENT PARENT GUARDIAN:40233}.  The {PATIENT PARENT GUARDIAN:40233} verbalized understanding.  Discharge medications reviewed with the {Dishcarge meds reviewed WUJW:11914} and appropriate educational materials and side effects teaching were provided.  ___________________________________________________________________________________________________________________________________

## 2021-12-09 NOTE — Anesthesia Post-Procedure Evaluation (Signed)
Department of Anesthesiology  Postprocedure Note    Patient: [name] Short  MRN: [id number]  Birthdate: [birth year]  Date of evaluation: 12/09/2021      Procedure Summary     Date: 12/09/21 Room / Location: [facility] MAIN 06 / [facility] MAIN OR    Anesthesia Start: 0952 Anesthesia Stop: 1158    Procedure: CERVICAL THREE/FOUR/FIVE/SIX LAMINECTOMY FUSION; C-ARM; STRYKER; EXT BONE GROWTH STIM; 23 HR (Spine Cervical) Diagnosis:       Cervical spondylosis with myelopathy      Myopathy      (Cervical spondylosis with myelopathy [M47.12])      (Myopathy [G72.9])    Surgeons: Pamalee Leyden, MD Responsible Provider: Rivka Barbara, MD    Anesthesia Type: General ASA Status: 3          Anesthesia Type: General    Aldrete Phase I: Aldrete Score: 9    Aldrete Phase II:        Anesthesia Post Evaluation    Patient location during evaluation: bedside  Patient participation: complete - patient participated  Airway patency: patent  Complications: no  Cardiovascular status: hemodynamically stable  Respiratory status: acceptable  Hydration status: stable

## 2021-12-09 NOTE — Op Note (Signed)
 MEDICAL CENTER  OPERATIVE REPORT    Name:  [name] Short, [name] Short  MR#:   [id number]  DOB:  08-18-1955  ACCOUNT #:  [id number]  DATE OF SERVICE:  12/09/2021    PREOPERATIVE DIAGNOSIS:  Cervical spondylotic myelopathy.    POSTOPERATIVE DIAGNOSIS:  Cervical spondylotic myelopathy.    PROCEDURES PERFORMED:  C3, C4, C5, C6 laminectomy; posterior cervical fusion C3, C4, C5, C6; segmental instrumentation, Stryker type, C3, C4, C5, C6 with lateral mass screws.    SURGEON:  Pamalee Leyden, MD    ASSISTANT:  Yevonne Pax, PA    ANESTHESIA:  General endotracheal.    COMPLICATIONS:  No complications.    SPECIMENS REMOVED:  No specimens.    IMPLANTS:  Stryker lateral mass screws.    ESTIMATED BLOOD LOSS:  100 mL.    FINDINGS:  The patient had poor bone, ligamentous hypertrophy, clear indentation to the thecal sac.    PROCEDURE:  Following induction of endotracheal anesthesia, the patient was turned in prone position on a cervical headholder.  The patient was prepped and draped in usual fashion.  Midline incision was made.  Paramedian incision was made in the cervicodorsal fascia.  Subperiosteal dissection done from the inferior aspect of C2 to the superior aspect of C7.  C-arm image verified our surgical level.  Strict hemostasis maintained.  The facets were debrided.  Lateral masses and facets defined.  Starting points for lateral mass instrumentation dimpled with a high-speed bur at C3, C4, C5, and C6.  Then, high-speed drill was used to place classic lateral mass screws.  The holes were palpated with a ball-tip probe to confirm they were intact.  A trough was made at the lamina-facet junction bilaterally.  First on the left.  Then, with a 1- and 2-mm Kerrison, a laminectomy starting at the C6-7 level going to the C2-3 level on the left at the trough made followed by placement of the lateral mass screws on that side and a lordosed rod.  Final tightening and torquing then done.  Then, on the right side again, the trough made at  the lamina-facet junction.  Laminectomy done from the inferior aspect of C6 to the superior aspect of C3 was done.  Lateral mass screws placed at the predrilled holes at C3, C4, C5, and C6 with lordosed rods.  Then, an en bloc laminectomy done after providing the interspinous ligaments and structures at C6-7 and C2-3.  The entire lobster shell of the lamina was removed, C6, C5, C4, and C3.  Visualization of the cord with a 1- and 2-mm Kerrison allowed me to touch up some areas that continued ligamentous hypertrophy causing compression.  At the conclusion, the cord was free of compression and the spine stabilized.  Final tightening and torquing had been done.  The bone resected from the lamina was debrided and then milled and used as autograft for the fusion.  Vancomycin powder instilled for infection prophylaxis.  There was no active bleeding.  A deep drain utilized.  The cervical dorsal fascia was closed in two layers with #1 Vicryl, subcutaneous tissues closed with 2-0 Vicryl, and the skin closed with a 3-0 Monocryl subcuticular suture and Dermabond.  A sterile occlusive dressing placed upon the wound.  All counts were correct.      Pamalee Leyden, MD      [id number]  D:  12/09/2021 11:32  T:  12/09/2021 17:05  JOB #:  [id number]

## 2021-12-09 NOTE — Care Coordination-Inpatient (Signed)
[facility]  [phone]    Case Manager Note  Patient Name: [name] Short  Date of Birth: 1955-09-03  Age: 66 y.o.    Date of Visit: 12/09/21  Patient Room: 2213/01     Attending: Pamalee Leyden, MD   Diagnosis: Cervical spondylosis with myelopathy [M47.12]  Myopathy [G72.9]  Cervical myelopathy (HCC) [G95.9]      Chart reviewed. Home Health orders acknowledged. Sent Home Health Referral to [facility] who is in network with [employer] [facility] provider.         Primary Care Physician verified   Uvaldo Bristle, APRN  [phone]    Rosalee Kaufman, MSW  Social Worker  [facility]

## 2021-12-09 NOTE — Anesthesia Pre-Procedure Evaluation (Addendum)
Department of Anesthesiology  Preprocedure Note       Name:  [name] Short   Age:  66 y.o.  DOB:  10/02/1955                                          MRN:  [id number]         Date:  12/09/2021      Surgeon: Juliann Mule):  Candi Leash, MD    Procedure: Procedure(s):  CERVICAL THREE/FOUR/FIVE/SIX LAMINECTOMY FUSION; C-ARM; STRYKER; EXT BONE GROWTH STIM; 23 HR    Medications prior to admission:   Prior to Admission medications    Medication Sig Start Date End Date Taking? Authorizing Provider   Aspirin (VAZALORE) 81 MG CAPS Take 1 capsule by mouth    Historical Provider, MD   lisinopril (PRINIVIL;ZESTRIL) 40 MG tablet Take 1 tablet by mouth daily    Historical Provider, MD   acetaminophen (TYLENOL) 500 MG tablet Take 500 mg by mouth every 4 hours as needed for Fever or Pain.    Historical Provider, MD   polyethylene glycol (GLYCOLAX) 17 g packet Take 17 g by mouth daily as needed for Constipation    Historical Provider, MD   psyllium 0.52 g capsule Take 0.52 g by mouth daily as needed (constipation). 1st line of defense  Patient not taking: Reported on 11/28/2021    Historical Provider, MD   ascorbic acid (VITAMIN C) 250 MG tablet Take 1 tablet by mouth daily 12/21/16   Historical Provider, MD   clopidogrel (PLAVIX) 75 MG tablet Take 1 tablet by mouth daily 09/16/21   Historical Provider, MD   Continuous Blood Gluc Sensor (FREESTYLE LIBRE 3 SENSOR) MISC MISCELLANEOUS CHANGE NEW SENSOR EVERY 14 DAYS  Patient not taking: Reported on 11/06/2021 08/01/21   Historical Provider, MD   cyclobenzaprine (FLEXERIL) 5 MG tablet Take 5 mg by mouth daily as needed  Patient not taking: Reported on 11/28/2021    Historical Provider, MD   Jimmy Footman BCISE 2 MG/0.85ML injection every 7 days 09/07/21   Historical Provider, MD   ferrous sulfate (IRON 325) 325 (65 Fe) MG tablet Take 1 tablet by mouth daily 12/21/16   Historical Provider, MD   furosemide (LASIX) 40 MG tablet furosemide 40 mg tablet   TAKE 1 TABLET BY MOUTH EVERY DAY 09/23/21   Historical  Provider, MD   gabapentin (NEURONTIN) 100 MG capsule Take 1 capsule by mouth as needed. 09/23/21   Historical Provider, MD   Glucagon, rDNA, (GLUCAGON EMERGENCY) 1 MG KIT INJECT 1 MILLIGRAM(S) INJECTION EVERY DAY AS NEEDED FOR LOW BLOOD DUGAR 08/18/21   Historical Provider, MD   HUMALOG MIX 75/25 KWIKPEN (75-25) 100 UNIT/ML SUPN injection pen Inject 0.17 mLs into the skin 2 times daily (with meals) 08/11/21   Historical Provider, MD   pantoprazole (PROTONIX) 40 MG tablet Take 40 mg by mouth daily as needed (acid reflux, heartburn) 12/21/16   Historical Provider, MD       Current medications:    Current Facility-Administered Medications   Medication Dose Route Frequency Provider Last Rate Last Admin   . lidocaine PF 1 % injection 1 mL  1 mL IntraDERmal Once PRN Filiberto Pinks, APRN - CRNA       . famotidine (PEPCID) tablet 20 mg  20 mg Oral Once Filiberto Pinks, APRN - CRNA       . lactated ringers  IV soln infusion   IntraVENous Continuous Filiberto Pinks, APRN - CRNA       . sodium chloride flush 0.9 % injection 5-40 mL  5-40 mL IntraVENous PRN Filiberto Pinks, APRN - CRNA       . insulin lispro (HUMALOG) injection vial 0-15 Units  0-15 Units SubCUTAneous Once Filiberto Pinks, APRN - CRNA       . glucose chewable tablet 16 g  4 tablet Oral PRN Filiberto Pinks, APRN - CRNA       . dextrose bolus 10% 125 mL  125 mL IntraVENous PRN Filiberto Pinks, APRN - CRNA        Or   . dextrose bolus 10% 250 mL  250 mL IntraVENous PRN Filiberto Pinks, APRN - CRNA       . glucagon (rDNA) injection 1 mg  1 mg SubCUTAneous PRN Filiberto Pinks, APRN - CRNA       . dextrose 10 % infusion   IntraVENous Continuous PRN Filiberto Pinks, APRN - CRNA       . acetaminophen (TYLENOL) tablet 1,000 mg  1,000 mg Oral Once Filiberto Pinks, APRN - CRNA       . celecoxib (CELEBREX) capsule 400 mg  400 mg Oral Once Filiberto Pinks, APRN - CRNA       . pregabalin (LYRICA) capsule 75 mg  75 mg Oral Once Filiberto Pinks, APRN - CRNA       . ceFAZolin (ANCEF) 2,000 mg in  sterile water 20 mL IV syringe  2,000 mg IntraVENous Once Vilma Meckel, PA-C       . tamsulosin (FLOMAX) capsule 0.4 mg  0.4 mg Oral Daily Candi Leash, MD       . tranexamic acid (CYKLOKAPRON) 1,000 mg in sodium chloride 0.9 % 110 mL IVPB (mini-bag)  1,000 mg IntraVENous Once Candi Leash, MD           Allergies:    Allergies   Allergen Reactions   . Lactose Other (See Comments)     Intolerance-Pt denies       Problem List:    Patient Active Problem List   Diagnosis Code   . Iron deficiency anemia due to chronic blood loss D50.0   . Diabetes ([place]) E11.9   . Occult blood in stools R19.5   . Stroke ([place]) I63.9   . Symptomatic anemia D64.9   . Cervical myelopathy ([facility]) G95.9   . Obstructive sleep apnea syndrome G47.33   . Spinal stenosis of lumbar region M48.061   . Syncope R55   . Cerebrovascular accident [facility]) I63.9       Past Medical History:        Diagnosis Date   . Anemia    . Arthritis    . Chronic pain     Legs and Shoulders   . Diabetes ([place])    . Exposure to asbestos    . History of blood transfusion    . History of colon polyps    . HTN (hypertension)    . Hx of blood clots     DVT   . Hyperlipemia    . Menopause    . Pulmonary nodule    . Serum calcium elevated    . Sleep apnea     not using cpap   . Stroke [facility])        Past Surgical History:        Procedure  Laterality Date   . CESAREAN SECTION  1987   . COLONOSCOPY N/A 01/13/2017    COLONOSCOPY performed by Joycelyn Schmid, MD at [facility]   . COLONOSCOPY     . DILATION AND CURETTAGE OF UTERUS  1995       Social History:    Social History     Tobacco Use   . Smoking status: Every Day     Packs/day: 1.00     Types: Cigarettes, Cigars     Last attempt to quit: 08/2019     Years since quitting: 2.3   . Smokeless tobacco: Never   Substance Use Topics   . Alcohol use: Yes     Comment: occ                                Ready to quit: Not Answered  Counseling given: Not Answered      Vital Signs (Current):   Vitals:    11/28/21 1115   Weight: 154 lb  (69.9 kg)   Height: 4' 11"  (1.499 m)                                              BP Readings from Last 3 Encounters:   12/05/21 135/81   12/05/21 124/82   12/05/21 130/72       NPO Status:                                                                                 BMI:   Wt Readings from Last 3 Encounters:   11/28/21 154 lb (69.9 kg)   08/29/21 154 lb (69.9 kg)   08/29/21 154 lb 6.4 oz (70 kg)     Body mass index is 31.1 kg/m.    CBC:   Lab Results   Component Value Date/Time    WBC 5.2 05/26/2021 02:43 AM    RBC 4.42 05/26/2021 02:43 AM    HGB 11.1 05/26/2021 02:43 AM    HCT 35.0 05/26/2021 02:43 AM    MCV 79.2 05/26/2021 02:43 AM    RDW 15.1 05/26/2021 02:43 AM    PLT 283 05/26/2021 02:43 AM       CMP:   Lab Results   Component Value Date/Time    NA 148 05/26/2021 02:43 AM    K 3.6 05/26/2021 02:43 AM    CL 113 05/26/2021 02:43 AM    CO2 29 05/26/2021 02:43 AM    BUN 14 05/26/2021 02:43 AM    CREATININE 0.76 05/26/2021 02:43 AM    GFRAA >60 12/11/2020 12:01 PM    AGRATIO 0.9 05/26/2021 02:43 AM    LABGLOM >60 12/11/2020 12:01 PM    GLUCOSE 53 05/26/2021 02:43 AM    PROT 7.0 05/26/2021 02:43 AM    CALCIUM 10.8 05/26/2021 02:43 AM    BILITOT 0.3 05/26/2021 02:43 AM    ALKPHOS 101 05/26/2021 02:43 AM    AST 28 05/26/2021 02:43 AM    ALT 29 05/26/2021 02:43 AM  POC Tests:   Recent Labs     12/09/21  0916   POCGLU 204*       Coags: No results found for: PROTIME, INR, APTT    HCG (If Applicable): No results found for: PREGTESTUR, PREGSERUM, HCG, HCGQUANT     ABGs: No results found for: PHART, PO2ART, PCO2ART, HCO3ART, BEART, O2SATART     Type & Screen (If Applicable):  No results found for: LABABO, LABRH    Drug/Infectious Status (If Applicable):  No results found for: HIV, HEPCAB    COVID-19 Screening (If Applicable): No results found for: COVID19        Anesthesia Evaluation  Patient summary reviewed and Nursing notes reviewed  Airway: Mallampati: II  TM distance: <3 FB   Neck ROM: limited  Mouth opening: >  = 3 FB   Dental:    (+) poor dentition  Comment: Few front teeth on bottom, scattered teeth remain up top, no loose per pt but very poor condition.      Pulmonary:normal exam    (+) sleep apnea:  current smoker                           Cardiovascular:    (+) hypertension: moderate,         Rhythm: regular  Rate: normal                    Neuro/Psych:   (+) CVA: no interval change,             GI/Hepatic/Renal:             Endo/Other:    (+) Diabetespoorly controlled, , blood dyscrasia: anemia:., .                 Abdominal: normal exam            Vascular: negative vascular ROS.         Other Findings:           Anesthesia Plan      general     ASA 3       Induction: intravenous.    MIPS: Postoperative opioids intended and Prophylactic antiemetics administered.  Anesthetic plan and risks discussed with patient.                        Jonette Pesa, MD   12/09/2021

## 2021-12-09 NOTE — Other (Signed)
Rounded on patient s/p cervical laminectomy/ fusion with Dr. Carney Living, [facility] 12/09/2021. Patient observed sleeping in bed. Awakens briefly to fall asleep again. She denies chest pain, shortness of breath,nausea or vomiting. Patient has strong grip to both hands which was her original presentation, unable to grip things. Hard collar observed in place, dressing posterior neck observed to be clean, dry and intact. JP drain in place to suction. Foley remains in place until first ambulation. At this time patient is too sedated to continue education. Will wait until tomorrow to review education with patient.

## 2021-12-09 NOTE — Interval H&P Note (Signed)
Update History & Physical    The patient's History and Physical of Dec 07, 2023 was reviewed with the patient and I examined the patient. There was no change. The surgical site was confirmed by the patient and me.     Plan: The risks, benefits, expected outcome, and alternative to the recommended procedure have been discussed with the patient. Patient understands and wants to proceed with the procedure.     Electronically signed by Pamalee Leyden, MD on 12/09/2021 at 9:15 AM

## 2021-12-09 NOTE — Brief Op Note (Signed)
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 Item Serial No. Manufacturer Lot No. LRB No. Used Action   CADDY SCR POLYAX 3.5X12 MM VA VIRAGE - [id number]  CADDY SCR POLYAX 3.5X12 MM VA VIRAGE  ZIMMER SPINE-WD NA N/A 8 Implanted   ROD SPNL CRV 3.5X60 MM PREBENT TI VIRAGE - [id number]  ROD SPNL CRV 3.5X60 MM PREBENT TI VIRAGE  ZIMMER SPINE-WD NA N/A 2 Implanted   CAP SPNL OCT CLSR FIX SYS VIRAGE - [id number]  CAP SPNL OCT CLSR FIX SYS VIRAGE  ZIMMER SPINE-WD NA N/A 8 Implanted         Drains:   Closed/Suction Drain Posterior;Right Neck Bulb (Active)       Urinary Catheter Dec 30, 2021 Foley (Active)       Findings: stenosis, poor bone        Electronically signed by Pamalee Leyden, MD on 12/30/21 at 11:15 AM

## 2021-12-09 NOTE — Plan of Care (Addendum)
Problem: Physical Therapy - Adult  Goal: By Discharge: Performs mobility at highest level of function for planned discharge setting.  See evaluation for individualized goals.  Description: Physical Therapy Goals:  Initiated 12/09/2021 to be met within 7-10 days.    1.  Patient will move from supine to sit and sit to supine  in bed with modified independence.    2.  Patient will transfer from bed to chair and chair to bed with modified independence using the least restrictive device.  3.  Patient will perform sit to stand with modified independence.  4.  Patient will ambulate with modified independence for 150 feet with the least restrictive device.   5.  Patient will ascend/descend 4 stairs with handrail(s) with supervision/set-up.    PLOF: Patient is mod I with cane/RW. She lives with her daughter in single story home with 4 STE.       Outcome: Progressing     PHYSICAL THERAPY EVALUATION    Patient: [name] Short (66 y.o. 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 Patient sleeping and opens eyes briefly to tactile stimulus. Daughter present at bedside and able to provide with PLOF. Educated on cervical protection precautions and role of PT. Patient is limited by drowsiness. She requires max A for supine to sit transfer. She is unable to stay awake sitting on the side of the bed, therefore further mobility deferred. Total A with RN to scoot up towards [facility] for repositioning. Patient left resting semi reclined in bed with call bell in reach and bed alarm activated for safety.     DEFICITS/IMPAIRMENTS:    , Body Structures, Functions, Activity Limitations  Requiring Skilled Therapeutic Intervention: Decreased functional mobility ;Decreased balance;Decreased strength;Decreased safe awareness    Patient will benefit from skilled intervention to address the above impairments.  Patient's rehabilitation potential/Therapy Prognosis: Good.  Factors which may influence rehabilitation potential include:   []          None noted  []          Mental ability/status  []          Medical condition  []          Home/family situation and support systems  [x]          Safety awareness  []          Pain tolerance/management  []          Other:      PLAN :  Recommendations and Planned Interventions:   [x]            Bed Mobility Training             [x]     Neuromuscular Re-Education  [x]            Transfer Training                   []     Orthotic/Prosthetic Training  [x]            Gait Training                          []   Modalities  [x]            Therapeutic Exercises           []     Edema Management/Control  [x]            Therapeutic Activities            [x]     Family Training/Education  [x]            Patient Education  []            Other (comment):    Frequency/Duration: Patient will be followed by physical therapy to address goals, 1-2 times per day/3-5 days per week to address goals.    Further Equipment Recommendations for Discharge: n/a    AMPAC: AM-PAC Inpatient Mobility Raw Score : 12      Anticipate discharge to home with home health and family support.     Current research shows that an AM-PAC score of 18 (14 without stairs) or greater is associated with a discharge to the patient's home setting. Based on an AM-PAC score and their current functional mobility deficits, it is recommended that the patient have 2-3 sessions per week of Physical Therapy at d/c to increase the patient's independence.      This AMPAC score should be considered in conjunction with interdisciplinary team recommendations to determine the most appropriate discharge setting. Patient's social support,  diagnosis, medical stability, and prior level of function should also be taken into consideration.     SUBJECTIVE:   Patient stated "Tired."    OBJECTIVE DATA SUMMARY:     Past Medical History:   Diagnosis Date    Anemia     Arthritis     Chronic pain     Legs and Shoulders    Diabetes ([place])     Exposure to asbestos     History of blood transfusion     History of colon polyps     HTN (hypertension)     Hx of blood clots     DVT    Hyperlipemia     Menopause     Pulmonary nodule     Serum calcium elevated     Sleep apnea     not using cpap    Stroke [facility])      Past Surgical History:   Procedure Laterality Date    CESAREAN SECTION  1987    COLONOSCOPY N/A 01/13/2017    COLONOSCOPY performed by Joycelyn Schmid, MD at [place] Situation:  Social/Functional History  Lives With: Daughter  Type of Home: House  Home Layout: One level  Home Access: Stairs to enter with rails  Entrance Stairs - Number of Steps: 4  Home Equipment: Walker, rolling, Cane  Ambulation Assistance: Independent  Transfer Assistance: Independent  Critical Behavior:  Orientation  Orientation Level: Oriented to place;Oriented to person;Oriented to situation       Strength:    Strength: Generally decreased, functional    Tone & Sensation:   Tone: Normal  Sensation: Intact    Range Of Motion:  AROM: Generally decreased, functional  PROM: Within functional limits    Functional Mobility:  Bed Mobility:     Bed Mobility Training  Bed Mobility Training: Yes  Rolling: Maximum assistance  Supine to Sit: Maximum assistance  Sit to Supine: Maximum assistance  Scooting: Total assistance;Assist X2  Balance:        Balance  Sitting: Impaired  Sitting - Static: Poor (constant support)       Pain:  Pain level pre-treatment: -/10   Pain level post-treatment: -/10   Pain Intervention(s): Medication (see MAR); Rest, Ice, Repositioning  Response to intervention: Nurse notified, See doc  flow    Activity Tolerance:   Activity Tolerance: Patient limited by fatigue  Please refer to the flowsheet for vital signs taken during this treatment.    After treatment:   []          Patient left in no apparent distress sitting up in chair  [x]          Patient left in no apparent distress in bed  [x]          Call bell left within reach  [x]          Nursing notified  []          Caregiver present  [x]          Bed alarm activated  [x]          SCDs applied    COMMUNICATION/EDUCATION:   Patient Education  Education Given To: Patient;Family  Education Provided: Role of Therapy;Plan of Care;Precautions  Education Method: Verbal;Teach Back  Barriers to Learning: None  Education Outcome: Verbalized understanding;Continued education needed    Thank you for this referral.  Collier Bullock, PT  Minutes: 25      Eval Complexity: Decision Making: Medium Complexity

## 2021-12-09 NOTE — Other (Signed)
TRANSFER - OUT REPORT:    Verbal report given to Marisue Ivan on [name] Short  being transferred to room  2213 for routine progression of patient care       Report consisted of patient's Situation, Background, Assessment and   Recommendations(SBAR).     Information from the following report(s) Nurse Handoff Report, Surgery Report, and Intake/Output was reviewed with the receiving nurse.  Kinder Assessment: No data recorded  Lines:   Peripheral IV 12/09/21 Left Hand (Active)   Site Assessment Clean, dry & intact 12/09/21 1152   Line Status Infusing 12/09/21 1152   Line Care Connections checked and tightened 12/09/21 1152   Phlebitis Assessment No symptoms 12/09/21 1152   Infiltration Assessment 0 12/09/21 1152   Dressing Status Clean, dry & intact 12/09/21 1152   Dressing Type [place];Transparent 12/09/21 1152        Opportunity for questions and clarification was provided.      Patient transported with:  [employer]

## 2021-12-10 ENCOUNTER — Encounter: Payer: PRIVATE HEALTH INSURANCE | Primary: Registered Nurse

## 2021-12-10 LAB — BASIC METABOLIC PANEL
Anion Gap: 3 mmol/L (ref 3.0–18)
BUN/Creatinine Ratio: 24 — ABNORMAL HIGH (ref 12–20)
BUN: 31 mg/dL — ABNORMAL HIGH (ref 7.0–18)
CO2: 25 mmol/L (ref 21–32)
Calcium: 10.5 mg/dL — ABNORMAL HIGH (ref 8.5–10.1)
Chloride: 107 mmol/L (ref 100–111)
Creatinine: 1.31 mg/dL — ABNORMAL HIGH (ref 0.6–1.3)
Est, Glom Filt Rate: 46 mL/min/{1.73_m2} — ABNORMAL LOW (ref >60)
Glucose: 310 mg/dL — ABNORMAL HIGH (ref 74–99)
Potassium: 5.3 mmol/L (ref 3.5–5.5)
Sodium: 135 mmol/L — ABNORMAL LOW (ref 136–145)

## 2021-12-10 LAB — POCT GLUCOSE
POC Glucose: 295 mg/dL — ABNORMAL HIGH (ref 70–110)
POC Glucose: 357 mg/dL — ABNORMAL HIGH (ref 70–110)
POC Glucose: 365 mg/dL — ABNORMAL HIGH (ref 70–110)
POC Glucose: 400 mg/dL — ABNORMAL HIGH (ref 70–110)

## 2021-12-10 LAB — CBC
Hematocrit: 32.1 % — ABNORMAL LOW (ref 35.0–45.0)
Hemoglobin: 10.2 g/dL — ABNORMAL LOW (ref 12.0–16.0)
MCH: 25.3 pg (ref 24.0–34.0)
MCHC: 31.8 g/dL (ref 31.0–37.0)
MCV: 79.7 FL (ref 78.0–100.0)
MPV: 10.7 FL (ref 9.2–11.8)
Nucleated RBCs: 0 /100{WBCs}
Platelets: 195 10*3/uL (ref 135–420)
RBC: 4.03 M/uL — ABNORMAL LOW (ref 4.20–5.30)
RDW: 15.3 % — ABNORMAL HIGH (ref 11.6–14.5)
WBC: 9.4 10*3/uL (ref 4.6–13.2)
nRBC: 0 10*3/uL (ref 0.00–0.01)

## 2021-12-10 MED ORDER — FAMOTIDINE 20 MG PO TABS
20 MG | Freq: Every day | ORAL | Status: AC
Start: 2021-12-10 — End: 2021-12-11
  Administered 2021-12-11: 14:00:00 20 mg via ORAL

## 2021-12-10 MED ORDER — FAMOTIDINE (PF) 20 MG/2ML IV SOLN
20 MG/2ML | Freq: Every day | INTRAVENOUS | Status: AC
Start: 2021-12-10 — End: 2021-12-11

## 2021-12-10 MED ORDER — OXYCODONE HCL 5 MG PO TABS
5 MG | ORAL_TABLET | Freq: Four times a day (QID) | ORAL | 0 refills | Status: AC | PRN
Start: 2021-12-10 — End: 2021-12-17

## 2021-12-10 MED FILL — BISACODYL EC 5 MG PO TBEC: 5 MG | ORAL | Qty: 1

## 2021-12-10 MED FILL — ACETAMINOPHEN 325 MG PO TABS: 325 MG | ORAL | Qty: 2

## 2021-12-10 MED FILL — CEFAZOLIN SODIUM 1 G IJ SOLR: 1 g | INTRAMUSCULAR | Qty: 2000

## 2021-12-10 MED FILL — HUMALOG 100 UNIT/ML IJ SOLN: 100 UNIT/ML | INTRAMUSCULAR | Qty: 1

## 2021-12-10 MED FILL — FAMOTIDINE 20 MG PO TABS: 20 MG | ORAL | Qty: 1

## 2021-12-10 MED FILL — TAMSULOSIN HCL 0.4 MG PO CAPS: 0.4 MG | ORAL | Qty: 1

## 2021-12-10 MED FILL — LISINOPRIL 40 MG PO TABS: 40 MG | ORAL | Qty: 1

## 2021-12-10 MED FILL — POLYETHYLENE GLYCOL 3350 17 G PO PACK: 17 g | ORAL | Qty: 1

## 2021-12-10 MED FILL — OXYCODONE HCL 5 MG PO TABS: 5 MG | ORAL | Qty: 1

## 2021-12-10 NOTE — Care Coordination-Inpatient (Signed)
Case Management Assessment  Initial Evaluation    Date/Time of Evaluation: 12/10/2021 8:21 AM  Assessment Completed by: Rosalee Kaufman    If patient is discharged prior to next notation, then this note serves as note for discharge by case management.    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 Yes    Chart Reviewed: Yes      History Provided by: Patient  Patient Orientation: Alert and Oriented    Patient Cognition: Alert    Hospitalization in the last 30 days (Readmission):  No    If yes, Readmission Assessment in CM Navigator will be completed.    Advance Directives:      Code Status: Full Code   Patient's Primary Decision Maker is: Legal Next of Kin      Discharge Planning:    Patient lives with: (P) Family Members Type of Home: House  Primary Care Giver: Self  Patient Support Systems include: Family Members, Children   Current Financial resources: (P) None  Current community resources: (P) None  Current services prior to admission: (P) None            Current DME: Ephraim Hamburger            Type of Home Care services:  (P) PT    ADLS  Prior functional level: (P) Independent in ADLs/IADLs  Current functional level: (P) Independent in ADLs/IADLs    PT AM-PAC: 12 /24  OT AM-PAC:   /24    Family can provide assistance at DC: (P) Yes  Would you like Case Management to discuss the discharge plan with any other family members/significant others, and if so, who? (P) No  Plans to Return to Present Housing: (P) Yes  Other Identified Issues/Barriers to RETURNING to current housing: No  Potential Assistance needed at discharge: (P) Outpatient PT/OT, Home Care             Potential DME:    Patient expects to discharge to: (P) House  Plan for transportation at discharge: (P) Family    Financial    Payor: OPTIMA / Plan: OPTIMA HMO / Product Type: *No Product type* /     Does insurance require precert for SNF: Yes    Potential assistance Purchasing Medications: (P) No  Meds-to-Beds request:        [id number] [place], [place] - [address] [phone] Carmon Ginsberg [phone]  [address]  [place] [address]  Phone: [phone] Fax: [phone]      Notes:    Factors facilitating achievement of predicted outcomes: Family support    Barriers to discharge: None    Additional Case Management Notes:     The Plan for Transition of Care is related to the following treatment goals of 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 Freedom of choice list with basic dialogue that supports the patient's individualized plan of care/goals and shares the quality data associated with the providers was provided to: (P) Patient      The Patient and/or Patient Representative Agree with the Discharge Plan? (P) Yes       12/09/21 1702   Service Assessment   Patient Orientation Alert and Oriented   Cognition Alert   History Provided By Patient   Primary Caregiver Self   Support Systems Family Members;Children   Patient's Healthcare Decision Maker is: Legal Next of Kin   PCP Verified by CM Yes   Last Visit to PCP Within last 3 months   Prior Functional Level Independent in ADLs/IADLs   Current Functional Level Independent in ADLs/IADLs   Can patient return to prior living arrangement Yes   Ability to make needs known: Good   Family able to assist with home care needs: Yes   Would you like for me to discuss the discharge plan with any other family members/significant others, and if so, who? No    Psychologist, occupational None   Social/Functional History   Lives With Daughter   Type of Home House   Home Layout One level   Home Access Stairs to enter with rails   Entrance Stairs - Number of Steps 4   Bathroom Accessibility Accessible   Home Equipment Walker, rolling;Cane   Receives Help From Family   ADL Assistance Independent   Homemaking Assistance Independent   Homemaking Responsibilities No   Ambulation Assistance Independent   Transfer Assistance Independent   Mode of Transportation Family   Occupation Full time employment   Type of Occupation [employer]   Discharge Planning   Living Arrangements Family Members   Current Services Prior To Admission None   Potential Assistance Needed Outpatient PT/OT;Home Care   DME Ordered? No   Potential Assistance Purchasing Medications No   Type of Home Care Services PT   Patient expects to be discharged to: [facility] At/After Discharge   Transition of Care Consult (CM Consult) Home Health   Internal Home Health Yes   Services At/After Discharge Home Health   [facility] Resource Information Provided? No   Mode of Transport at Discharge Other (see comment)   Confirm Follow Up Transport Family   Condition of Participation: Discharge Planning   The Patient and/or Patient Representative was provided with a Choice of Provider? Patient   The Patient and/Or Patient Representative agree with the Discharge Plan? Yes   Freedom of Choice list was provided with basic dialogue that supports the patient's individualized plan of care/goals, treatment preferences, and shares the quality data associated with the providers?  Yes     Rosalee Kaufman, MSW  Social Worker  [facility]

## 2021-12-10 NOTE — Progress Notes (Signed)
Vss afeb  Neuro intact  Co incisional pain  Pt ot  Has not voided since foley removal  Bladder scan, possible SC  Monitor drain  DC in pm if cleared PT, Voiding and drain able to be removed

## 2021-12-10 NOTE — Progress Notes (Signed)
Pt incontinent to urine. Patient went to the bathroom without assistance. Nurse educated patient on the importance of notifying nurse when she needs to go to the bathroom for safety reasons. Pt placed back into bed, alarm on.

## 2021-12-10 NOTE — Progress Notes (Signed)
[facility] Renal Dosing Note  Physician/Prescriber:  Dr Guilford Shi    Previous Regimen Famotidine 20 mg PO/IV BID   Serum Creatinine Lab Results   Component Value Date/Time    CREAPOC 0.8 06/28/2021 02:02 PM      Creatinine Clearance Estimated Creatinine Clearance: 38 mL/min (A) (based on SCr of 1.31 mg/dL (H)).   BUN Lab Results   Component Value Date/Time    BUN 31 12/10/2021 06:40 AM           The following medication: Famotidine was automatically dose-adjusted per [facility] Protocol, with respect to renal function.      Dosage changed to:  20 mg PO/IV Daily      Pharmacy to continue to monitor patient daily.   Will make dosage adjustments based upon changing renal function.  Signed Rudene Christians, RPH.

## 2021-12-10 NOTE — Progress Notes (Signed)
[facility] Dr. Ma Rings, Jul[facility] Rainier. Pt glucose range.

## 2021-12-10 NOTE — Progress Notes (Signed)
This writer is unable to get Pt to get out of the chair, ambulate or urinate. Pt observed with eyes shut tightly. Daughter at bedside. Education provided to both parties. Pt snapped at daughter angrily that is unable to pee because she has not drank enough water. 500 ml of water and 240 ml of tea provided earlier in the shift. Will continue to encourage Pt to drink  or ambulate.

## 2021-12-10 NOTE — Progress Notes (Signed)
Patient sitting up asleep in chair  10ccs serosanguinous drainage in JP  Voiding  Has cleared PT  Plan  D/c drain in AM  D/c home in AM  2 week follow up in clinic

## 2021-12-10 NOTE — Progress Notes (Signed)
Pt continues to decline mobility. Observed up in the chair, legs elevated/ ted stockings maintained. Blankets provided for comfort.

## 2021-12-10 NOTE — IP Home Care (Addendum)
Patient is Active to [facility] for SN and PT, Received new ROC orders from Dr Carney Living for SN,PT,Kerner protocol; [facility] referral updated, Spoke to patient in room, Verified demographics,explained [facility] services and answered all questions and provided patient with [facility] contact card ;  Patient states she has DME: Rollator and cane; HH referral updated. [facility] will continue to follow for pending discharge today .C.Vladimir Lenhoff,LPN.    13:45 pm- HH referral  updated to include HH orders for OT also; [facility] will follow for SN,PT,OT, Kerner protocol. C.Darshana Curnutt,LPN.

## 2021-12-10 NOTE — Care Coordination-Inpatient (Signed)
[facility]  [phone]    Case Manager Note  Patient Name: [name] Short  Date of Birth: 1955-08-18  Age: 66 y.o.    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 The pt is Active with [facility].  The pt states she has DME: Rollator and cane; [facility] will continue to follow for pending discharge.    Primary Care Physician verified   Uvaldo Bristle, APRN  [phone]    Rosalee Kaufman, MSW  Social Worker  [facility]

## 2021-12-10 NOTE — Plan of Care (Signed)
Problem: Safety - Adult  Goal: Free from fall injury  12/10/2021 0120 by Cleone Slim, RN  Outcome: Progressing  12/09/2021 1605 by Leda Quail, RN  Outcome: Progressing     Problem: ABCDS Injury Assessment  Goal: Absence of physical injury  12/10/2021 0120 by Cleone Slim, RN  Outcome: Progressing  12/09/2021 1605 by Leda Quail, RN  Outcome: Progressing     Problem: Pain  Goal: Verbalizes/displays adequate comfort level or baseline comfort level  12/10/2021 0120 by Cleone Slim, RN  Outcome: Progressing  12/09/2021 1605 by Leda Quail, RN  Outcome: Progressing

## 2021-12-10 NOTE — Progress Notes (Signed)
Pt is in recliner with legs elevated and eyes closed. Nurse woke up patient and patient asked for covers. Nurse applied covers and patient in chair with no signs of distress noted.

## 2021-12-10 NOTE — Other (Addendum)
Rounded on patient s/p cervical laminectomy/ fusion with Dr. Carney Living, [facility] 12/09/2021. Patient observed to be lying in bed with eyes closed. Earlier today patient was observed ambulating in hallway with steady gate and use of rolling walker. Foley has been removed and patient has voided. JP drain remains in place to suction. Last output 30cc at 0830. Dressing to posterior neck observed to be clean, dry and intact. Patient continues to wear collar. She denies chest pain, shortness of breath, nausea or vomiting. Per patient pain is 8/10 most times, but patient is observed frequently falling asleep during conversation. Postop showering instructions reviewed with patient. She is denying any numbness or tingling in her upper extremities, she has a strong grip on both sides. She denies any difficulty swallowing but does endorse some throat soreness. Encouraged patient to sleep with head elevated and to ice to assist with pain and swelling. Patient has been cleared safe for discharge by PT/OT. She has required DME at home and lives with her daughter. She Is already established with [facility] home health. Order for home health, home physical therapy and home occupational therapy placed to assist patient in becoming independent with her adls.  Will continue to monitor until discharge and then postoperatively.

## 2021-12-10 NOTE — Progress Notes (Signed)
Pt voided 325 cc yellow, no odor.

## 2021-12-10 NOTE — Care Coordination-Inpatient (Signed)
[facility]  [phone]    Case Manager Note  Patient Name: [name] Short  Date of Birth: 1955/11/04  Age: 66 y.o.    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 SW reviewed and explained with the patient Joelly Bolanos at bedside and signature was obtained. A signed copy provided to patient/representative. Original signed document placed in patient's chart.         12/10/21 0823   IMM Letter   Observation Status Letter date given: 12/10/21   Observation Status Letter time given: 0822   Observation Status Letter given to Patient/Family/Significant other/Guardian/POA/by: Rosalee Kaufman, Case Manager       Rosalee Kaufman, MSW  Social Worker  [facility]

## 2021-12-10 NOTE — Progress Notes (Signed)
Pt voided 400 cc, assisted back into chair, no signs of distress noted.

## 2021-12-10 NOTE — Plan of Care (Signed)
Problem: Physical Therapy - Adult  Goal: By Discharge: Performs mobility at highest level of function for planned discharge setting.  See evaluation for individualized goals.  Description: Physical Therapy Goals:  Initiated 12/09/2021 to be met within 7-10 days.    1.  Patient will move from supine to sit and sit to supine  in bed with modified independence.    2.  Patient will transfer from bed to chair and chair to bed with modified independence using the least restrictive device.  3.  Patient will perform sit to stand with modified independence.  4.  Patient will ambulate with modified independence for 150 feet with the least restrictive device.   5.  Patient will ascend/descend 4 stairs with handrail(s) with supervision/set-up.    PLOF: Patient is mod I with cane/RW. She lives with her daughter in single story home with 4 STE.       Outcome: Adequate for Discharge   PHYSICAL THERAPY TREATMENT/DISCHARGE    Patient: [name] Short (66 y.o. 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 MinA for supine to sit, Sitting on EOB with good balance. Pt standing with supervision to RW. Ambulating in room x15 feet, requesting to toilet. Pt returned to bathroom and voided urine, RN notified.  Ambulating x200 feet with slow gait speed. Pt ascending/descending 4 steps with R handrail and supervision. Pt then ambulating back to recliner. Pt positioned for comfort and educated to call for  assist before getting up, pt verbalized understanding. Pt left with all needs met and call bell in reach. RN notified of position and participation.     Pt has demonstrated mobility for discharge home with RW and family assist.         PLAN:  Maximum therapeutic gains met at current level of care and patient will be discharged from physical therapy at this time.  Rationale for discharge:  [x]     Goals Achieved  []     Plateau Reached  []     Patient not participating in therapy  []     Other:    Further Equipment Recommendations for Discharge: rolling walker    AMPAC:          Current research shows that an AM-PAC score of 18 (14 without stairs) or greater is associated with a discharge to the patient's home setting. Based on an AM-PAC score and their current functional mobility deficits, it is recommended that the patient have 2-3 sessions per week of Physical Therapy at d/c to increase the patient's independence.        This AMPAC score should be considered in conjunction with interdisciplinary team recommendations to determine the most appropriate discharge setting. Patient's social support, diagnosis, medical stability, and prior level of function should also be taken into consideration.     SUBJECTIVE:   Patient stated, "I can do the steps."    OBJECTIVE DATA SUMMARY:   Critical Behavior:  Orientation  Overall Orientation Status: Within Normal Limits  Orientation Level: Oriented X4  Cognition  Overall Cognitive Status: WNL    Functional Mobility Training:  Bed Mobility:  Bed Mobility Training  Bed Mobility Training: No  Supine to Sit: Minimum assistance  Scooting: Contact-guard assistance  Transfers:  Pharmacologist: Yes  Sit to Stand: Supervision  Stand to Sit: Supervision  Toilet Transfer: Stand-by assistance  Balance:  Balance  Sitting: Intact  Sitting - Static: Good (unsupported)  Sitting - Dynamic: Good (unsupported)  Standing: With support  Standing - Static: Good  Standing -  Dynamic: Good     Ambulation/Gait Training:     Gait  Overall Level of Assistance: Stand-by assistance  Speed/Cadence: Slow;Shuffled  Step Length: Right shortened;Left shortened  Gait Abnormalities: Decreased step clearance  Distance (ft): 200 Feet  Assistive Device: Walker, rolling  Rail Use: Right  Stairs - Level of Assistance: Supervision  Number of Stairs Trained: 4      Pain:  Pain level pre-treatment: 0/10  Pain level post-treatment: 0/10     Activity Tolerance:   Activity Tolerance: Patient tolerated treatment well  Please refer to the flowsheet for vital signs taken during this treatment.  After treatment:   [x] Patient left in no apparent distress sitting up in chair  [] Patient left in no apparent distress in bed  [x] Call bell left within reach  [x] Nursing notified  [] Caregiver present  [] Bed alarm activated  [] SCDs applied      COMMUNICATION/EDUCATION:   Patient Education  Education Given To: Patient;Family  Education Provided: Role of Therapy;Plan of Care;Precautions;Transfer Training;Equipment;Energy Conservation  Education Method: Verbal;Teach Back  Barriers to Learning: None  Education Outcome: Verbalized understanding      Para March, PT  Minutes: [phone]

## 2021-12-10 NOTE — Plan of Care (Addendum)
Problem: Occupational Therapy - Adult  Goal: By Discharge: Performs self-care activities at highest level of function for planned discharge setting.  See evaluation for individualized goals.  Description: Occupational Therapy Goals:  Initiated 12/10/2021 to be met within 7-10 days.    1.  Patient will perform self-feeding and grooming with modified independence.   2.  Patient will perform bathing with modified independence using adaptive equipment while adhering to spinal precautions.  3.  Patient will perform upper body dressing and lower body dressing  with modified independence using adaptive equipment while adhering to spinal precautions..  4.  Patient will perform toilet transfers with modified independence using RW with good balance.  5.  Patient will perform all aspects of toileting with modified independence.  6.  Patient will utilize energy conservation techniques during functional activities with min verbal cues.    PLOF: Mod I with additional time required for ADLs and functional mobility using rollator    Outcome: Progressing   OCCUPATIONAL THERAPY EVALUATION    Patient: [name] Short (66 y.o. 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 Patient issued and instructed on adaptive equipment: long handle bath sponge and shoe horn, demonstration for reacher and sock aide for use during LB dressing to increase independence and grade easier, patient noted with mild  incoordination of dominant R hand with hx of CVA, pt able to don sock onto sock aide with additional time, although Min A for hiking sock up over heel d/t TED hose (pt wears at baseline for lymphedema) Patient also verbalized understanding for use of reacher for retrieval of items e.g. floor surface to prevent falls and provided good return demonstration following doffing sock with reach and retrieving from floor surface, instruction for use of reacher to thread adult pull up and pants over feet. Toilet transfer: SBA vc's for correct hand placement during STS from recliner w/ SBA and safety with RW e.g. keep close to self and pacing, SBA toilet tasks. Dtr reports she is ordering a bedside commode to have next to sofa where pt sleeps d/t bathroom is a long distance and also plans to install toilet safety frame over toilet for fall prevention. Pt and dtr verbal understanding for recommendation of 24/7 supv and assist e.g. ADLs, IADLs, meal prep and OT Home health for continued self care/ADL retraining and recommendation for additional DME and shower/tub transfers with current set up of grab bar and shower chair versus tub transfer bench.  Pt sitting up in recliner with BLEs at end of tx session, call bell within reach & pt verbalized understanding to utilize for assist e.g. functional transfers in order to prevent falls.           DEFICITS/IMPAIRMENTS:  Performance deficits / Impairments: Decreased functional mobility ;Decreased safe awareness;Decreased balance;Decreased coordination;Decreased ADL status;Decreased ROM;Decreased endurance;Decreased strength;Decreased fine motor control    Patient will benefit from skilled intervention to address the above impairments.  Patient's rehabilitation potential/Prognosis: Good.  Factors which may influence rehabilitation potential include:   _0   None noted  _0              Mental ability/status  _1              Medical condition  _2              Home/family situation  and support systems  _3              Safety awareness  _4              Pain tolerance/management  _5              Other:      PLAN :  Recommendations and Planned Interventions:   _6                Self Care Training                  _7       Therapeutic Activities  _8                Functional Mobility Training   _9       Cognitive Retraining  _10                Therapeutic Exercises           _11       Endurance Activities  _12                Balance Training                    _13       Neuromuscular Re-Education  _14                Visual/Perceptual Training     _15       Home Safety Training  _16                Patient Education                   _17       Family Training/Education  _18                Other (comment):    Frequency/Duration: Patient will be followed by occupational therapy to address goals, 1-2 times per day/3-5 days per week to address goals.    Further Equipment Recommendations for Discharge: bedside commode, transfer bench, and rolling walker    AMPAC: AM-PAC Inpatient Daily Activity Raw Score: 18    Current research shows that an AM-PAC score of 18 or greater is associated with a discharge to the patient's home setting.  Based on an AM-PAC score and their current ADL deficits; it is recommended that the patient have 2-3 sessions per week of Occupational Therapy at d/c to increase the patient's independence.        This AMPAC score should be considered in conjunction with interdisciplinary team recommendations to determine the most appropriate discharge setting. Patient's social support, diagnosis, medical stability, and prior level of function should also be taken into consideration.     SUBJECTIVE:   Patient stated, "I work in Medical sales representative."    OBJECTIVE DATA SUMMARY:     Past Medical History:   Diagnosis Date    Anemia     Arthritis     Chronic pain     Legs and Shoulders    Diabetes (English)     Exposure to asbestos     History of blood transfusion     History of colon polyps     HTN (hypertension)  Hx of  blood clots     DVT    Hyperlipemia     Menopause     Pulmonary nodule     Serum calcium elevated     Sleep apnea     not using cpap    Stroke [facility])      Past Surgical History:   Procedure Laterality Date    CESAREAN SECTION  1987    COLONOSCOPY N/A 01/13/2017    COLONOSCOPY performed by Joycelyn Schmid, MD at [facility] N/A 12/09/2021    CERVICAL THREE/FOUR/FIVE/SIX LAMINECTOMY FUSION; C-ARM; STRYKER; EXT BONE GROWTH STIM; 23 HR performed by Candi Leash, MD at [place] Situation:   Social/Functional History  Lives With: Daughter  Type of Home: House  Home Layout: One level  Home Access: Stairs to enter with rails  Entrance Stairs - Number of Steps: 4  Entrance Stairs - Rails: Both  Bathroom Shower/Tub: Engineer, materials: Civil engineer, contracting, Grab bars in Designer, multimedia Accessibility: Accessible  Home Equipment: Rollator, Cane  Has the patient had two or more falls in the past year or any fall with injury in the past year?: Unknown  Receives Help From: Family  ADL Assistance: [place]: Independent  Homemaking Responsibilities: No  Ambulation Assistance: Tourist information centre manager Assistance: Teacher, English as a foreign language: Yes  Mode of Transportation: SUV  Occupation: Full time employment  Type of Occupation: CLERICAL  _0   Right hand dominant   _1   Left hand dominant    Cognitive/Behavioral Status:  Orientation  Overall Orientation Status: Within Normal Limits  Orientation Level: Oriented X4  Cognition  Overall Cognitive Status: WNL    Skin: s/p anterior cervical fusion  Edema: s/p anterior cervical fusion    Vision/Perceptual:    Vision  Vision: Impaired  Vision Exceptions: Wears glasses for reading        Coordination: BUE  Coordination: Generally decreased, functional        Balance:     Balance  Sitting: Intact  Sitting - Static: Good (unsupported)  Standing: Impaired  Standing - Static:  Good  Standing - Dynamic: Good    Strength: BUE  Strength: Generally decreased, functional (MMT NT, grossly RUE shoulder 3-/5, LUE 3/5, elbow WFL)    Tone & Sensation: BUE  Tone: Normal       Range of Motion: BUE  AROM: Generally decreased, functional         Functional Mobility and Transfers for ADLs:  Bed Mobility:     Bed Mobility Training  Bed Mobility Training: No  Transfers:                 Pharmacologist: Yes  Sit to Stand: Stand-by assistance  Stand to Sit: Stand-by assistance  Toilet Transfer: Stand-by assistance    ADL Assessment:   Feeding: Setup  Grooming: Stand by assistance;Setup  UE Bathing: Stand by assistance;Setup  LE Bathing: Contact guard assistance  UE Dressing: Stand by assistance  LE Dressing: Minimal assistance  Toileting: Stand by assistance      Pain:  Pain level pre-treatment: 5/10   Pain level post-treatment: 6/10   Pain Intervention(s): Rest, Ice, Repositioning   Response to intervention: Nurse notified    Activity Tolerance:   Activity Tolerance: Patient Tolerated treatment well  Please refer to  the flowsheet for vital signs taken during this treatment.    After treatment:   _0  Patient left in no apparent distress sitting up in chair  _1  Patient left in no apparent distress in bed  _2  Call bell left within reach  _3  Nursing notified  _4  daughter present  _5  Bed alarm activated    COMMUNICATION/EDUCATION:   Patient Education  Education Given To: Patient;Family  Education Provided: Role of Therapy;Plan of Care;Precautions;Equipment;Energy Risk manager;Fall Prevention Strategies;ADL Adaptive Strategies  Education Method: Demonstration;Verbal;Teach Back  Barriers to Learning: None  Education Outcome: Verbalized understanding;Demonstrated understanding;Continued education needed    Thank you for this referral.  Ezequiel Essex, OT  Minutes: 31    Eval Complexity: Decision Making: Medium Complexity

## 2021-12-11 ENCOUNTER — Encounter: Payer: PRIVATE HEALTH INSURANCE | Primary: Physician Assistant

## 2021-12-11 LAB — POCT GLUCOSE
POC Glucose: 364 mg/dL — ABNORMAL HIGH (ref 70–110)
POC Glucose: 222 mg/dL — ABNORMAL HIGH (ref 70–110)
POC Glucose: 328 mg/dL — ABNORMAL HIGH (ref 70–110)

## 2021-12-11 MED FILL — HUMALOG 100 UNIT/ML IJ SOLN: 100 UNIT/ML | INTRAMUSCULAR | Qty: 1

## 2021-12-11 MED FILL — FAMOTIDINE 20 MG PO TABS: 20 MG | ORAL | Qty: 1

## 2021-12-11 MED FILL — OXYCODONE HCL 5 MG PO TABS: 5 MG | ORAL | Qty: 1

## 2021-12-11 MED FILL — ACETAMINOPHEN 325 MG PO TABS: 325 MG | ORAL | Qty: 2

## 2021-12-11 MED FILL — LISINOPRIL 40 MG PO TABS: 40 MG | ORAL | Qty: 1

## 2021-12-11 NOTE — Plan of Care (Addendum)
Problem: Occupational Therapy - Adult  Goal: By Discharge: Performs self-care activities at highest level of function for planned discharge setting.  See evaluation for individualized goals.  Description: Occupational Therapy Goals:  Initiated 12/10/2021 to be met within 7-10 days.    1.  Patient will perform self-feeding and grooming with modified independence.   2.  Patient will perform bathing with modified independence using adaptive equipment while adhering to spinal precautions.  3.  Patient will perform upper body dressing and lower body dressing  with modified independence using adaptive equipment while adhering to spinal precautions..  4.  Patient will perform toilet transfers with modified independence using RW with good balance.  5.  Patient will perform all aspects of toileting with modified independence.  6.  Patient will utilize energy conservation techniques during functional activities with min verbal cues.    PLOF: Mod I with additional time required for ADLs and functional mobility using rollator      Outcome: Progressing   OCCUPATIONAL THERAPY TREATMENT    Patient: [name] Short (66 y.o. 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  ASSESSMENT:  Pt presents sitting up in recliner, reports neck pain 8/10-nursing notified. Pillow placed behind pt's back for additional comfort and support, pt reports improved comfort. Pt reports and nursing confirms pt recently performed toilet transfer  and tasks, pt agreeable to practice LB dressing with adaptive equipment I.e. reacher and sock aide. Pt requiring additional time d/t impaired (R)UE coordination and dexerity d/t hx of CVA, with additional time pt able to doff sock using reacher with SBA and don sock using sock aide w/ Min A and don sock onto foot with Min A using sock aide and good return demonstration for compensatory techniques. Pt sitting up in recliner with BLEs elevated, assist provided for set up of meal, call bell within reach & pt verbalized understanding to utilize for assist e.g. functional transfers in order to prevent falls.     Progression toward goals:  [x]           Improving appropriately and progressing toward goals  []           Improving slowly and progressing toward goals  []           Not making progress toward goals and plan of care will be adjusted     PLAN:  Patient continues to benefit from skilled intervention to address the above impairments.  Continue treatment per established plan of care.    Further Equipment Recommendations for Discharge: rolling walker, bedside commode and tub transfer bench    AMPAC: AM-PAC Inpatient Daily Activity Raw Score: 18      Current research shows that an AM-PAC score of 18 or greater is associated with a discharge to the patient's home setting. Based on an AM-PAC score and their current ADL deficits; it is recommended that the patient have 2-3 sessions per week of Occupational Therapy at d/c to increase the patient's independence.      This AMPAC score should be considered in  conjunction with interdisciplinary team recommendations to determine the most appropriate discharge setting. Patient's social support, diagnosis, medical stability, and prior level of function should also be taken into consideration.     SUBJECTIVE:   Patient stated, "I just went to the bathroom with the nurse."    OBJECTIVE DATA SUMMARY:   Cognitive/Behavioral Status:  Orientation  Overall Orientation Status: Within  Normal Limits  Orientation Level: Oriented X4  Cognition  Overall Cognitive Status: WNL      ADL Intervention:  Feeding: Setup              LE Dressing: Minimal assistance        Pain:  Pain level pre-treatment: 8/10   Pain level post-treatment: 8/10  Pain Intervention(s): Rest, Ice, Repositioning   Response to intervention: Nurse notified    Activity Tolerance:    Activity Tolerance: Patient tolerated treatment well  Please refer to the flowsheet for vital signs taken during this treatment.  After treatment:   []   Patient left in no apparent distress sitting up in chair  []   Patient left in no apparent distress in bed  []   Call bell left within reach  []   Nursing notified  []   Caregiver present  []   Bed alarm activated    COMMUNICATION/EDUCATION:   Patient Education  Education Given To: Patient  Education Provided: Role of Therapy;Plan of Care;Precautions;Equipment;Energy Conservation;Fall Prevention Strategies;ADL Adaptive Strategies  Education Method: Demonstration;Verbal;Teach Back  Barriers to Learning: None  Education Outcome: Verbalized understanding;Demonstrated understanding;Continued education needed      Thank you for this referral.  Ezequiel Essex, OT  Minutes: [phone]

## 2021-12-11 NOTE — IP Home Care (Signed)
Patient did not discharge yesterday.  Updated referral and sent to central intake and scheduling.

## 2021-12-11 NOTE — Discharge Summary (Signed)
Discharge/Transfer  Summary     Patient: [name] Short MRN: [id number]  SSN: [id number]    Date of Birth: [birth year]  Age: 66 y.o.  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 Intended supply: 7 days. Take lowest dose possible to manage pain Max Daily Amount: 20 mg  Qty: 28 tablet, Refills: 0    Comments: Reduce doses taken as pain becomes manageable  Associated Diagnoses: S/P cervical spinal fusion           CONTINUE these medications which have NOT CHANGED    Details   Aspirin (VAZALORE) 81 MG CAPS Take 1 capsule by mouth      lisinopril (PRINIVIL;ZESTRIL) 40 MG tablet Take 1 tablet by mouth daily      acetaminophen (TYLENOL) 500 MG tablet Take 1 tablet by mouth every 4 hours as needed for Fever or Pain      polyethylene glycol (GLYCOLAX) 17 g packet Take 17 g by mouth daily as needed for Constipation      ascorbic acid (VITAMIN C) 250 MG tablet Take 1 tablet by mouth daily      clopidogrel (PLAVIX) 75 MG tablet Take 1 tablet by mouth daily      Continuous Blood Gluc Sensor (FREESTYLE LIBRE 3 SENSOR) MISC MISCELLANEOUS CHANGE NEW SENSOR EVERY 14 DAYS      cyclobenzaprine (FLEXERIL) 5 MG tablet Take 5 mg by mouth daily as needed      BYDUREON BCISE 2 MG/0.85ML injection every 7 days      ferrous sulfate (IRON 325) 325 (65 Fe) MG tablet Take 1 tablet by mouth daily      furosemide (LASIX) 40 MG tablet furosemide 40 mg tablet   TAKE 1 TABLET BY MOUTH EVERY DAY      gabapentin (NEURONTIN) 100 MG capsule Take 1 capsule by mouth as  needed.      Glucagon, rDNA, (GLUCAGON EMERGENCY) 1 MG KIT INJECT 1 MILLIGRAM(S) INJECTION EVERY DAY AS NEEDED FOR LOW BLOOD DUGAR      HUMALOG MIX 75/25 KWIKPEN (75-25) 100 UNIT/ML SUPN injection pen Inject 0.17 mLs into the skin 2 times daily (with meals)      pantoprazole (PROTONIX) 40 MG tablet Take 40 mg by mouth daily as needed (acid reflux, heartburn)           STOP taking these medications       psyllium 0.52 g capsule Comments:   Reason for Stopping:                 No follow-ups on file.    Signed By: Vilma Meckel, PA-C     Dec 11, 2021

## 2021-12-11 NOTE — Care Coordination-Inpatient (Signed)
[facility]  [phone]    Case Manager Note  Patient Name: [name] Short  Date of Birth: Feb 23, 1956  Age: 66 y.o.    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 [name] Short requires a bedside commode due to being confined to one level of the home, and is physically incapable of utilizing regular toilet facilities.  Current body weight is Weight - Scale: 143 lb (64.9 kg).     SW placed DME order through the [facility] site and should be delivered to the pt's home.     Rosalee Kaufman, MSW  Social Worker  [facility]

## 2021-12-11 NOTE — Progress Notes (Signed)
2000 Patient ambulated to bathroom, back in bed.  2040 Posterior neck dressing dry and clean, JP drain intact draining serosanguinous drainage, denies numbness or tingling. Pain rates   /10 gave Roxicodone 1 tab. Patient able to move all extremities.  0030 Patient resting ,reported no pain. JP emptied 15 ml of serosanguinous liquid.  0430 assisted to potty. Medicated with Oxycodone 5 mg for pain.

## 2021-12-11 NOTE — Plan of Care (Signed)
Problem: Chronic Conditions and Co-morbidities  Goal: Patient's chronic conditions and co-morbidity symptoms are monitored and maintained or improved  Outcome: Progressing     Problem: Safety - Adult  Goal: Free from fall injury  Outcome: Progressing     Problem: ABCDS Injury Assessment  Goal: Absence of physical injury  Outcome: Progressing     Problem: Pain  Goal: Verbalizes/displays adequate comfort level or baseline comfort level  Outcome: Progressing     Problem: Discharge Planning  Goal: Discharge to home or other facility with appropriate resources  Outcome: Progressing     Problem: Occupational Therapy - Adult  Goal: By Discharge: Performs self-care activities at highest level of function for planned discharge setting.  See evaluation for individualized goals.  Description: Occupational Therapy Goals:  Initiated 12/10/2021 to be met within 7-10 days.    1.  Patient will perform self-feeding and grooming with modified independence.   2.  Patient will perform bathing with modified independence using adaptive equipment while adhering to spinal precautions.  3.  Patient will perform upper body dressing and lower body dressing  with modified independence using adaptive equipment while adhering to spinal precautions..  4.  Patient will perform toilet transfers with modified independence using RW with good balance.  5.  Patient will perform all aspects of toileting with modified independence.  6.  Patient will utilize energy conservation techniques during functional activities with min verbal cues.    PLOF: Mod I with additional time required for ADLs and functional mobility using rollator      12/11/2021 1024 by Ezequiel Essex, OT  Outcome: Progressing

## 2021-12-11 NOTE — Plan of Care (Signed)
Problem: Chronic Conditions and Co-morbidities  Goal: Patient's chronic conditions and co-morbidity symptoms are monitored and maintained or improved  Outcome: Progressing     Problem: Safety - Adult  Goal: Free from fall injury  Outcome: Progressing     Problem: ABCDS Injury Assessment  Goal: Absence of physical injury  Outcome: Progressing     Problem: Pain  Goal: Verbalizes/displays adequate comfort level or baseline comfort level  Outcome: Progressing     Problem: Discharge Planning  Goal: Discharge to home or other facility with appropriate resources  Outcome: Progressing

## 2021-12-11 NOTE — Progress Notes (Signed)
Discharge teaching completed at bedside with patient. Opportunity provided for clarifying questions. All answered to patient satisfaction. IV removed. Drain removed by RN(Steffi). ID removed and shredded.

## 2021-12-11 NOTE — Progress Notes (Signed)
Patient resting  Serous fluid in JP, scant amount  D/c JP  Has cleared pt  D.c home  2 week follow up in clinic

## 2021-12-11 NOTE — Telephone Encounter (Signed)
spironolactone (ALDACTONE) 25 MG tablet ? ?potassium chloride SA (KLOR-CON) 20 MEQ tablet, refill request @ [place] ?Pt would like this medications to be filled by today. Please call pt back.  ?

## 2021-12-12 ENCOUNTER — Encounter: Admit: 2021-12-12 | Discharge: 2021-12-12 | Payer: PRIVATE HEALTH INSURANCE | Primary: Physician Assistant

## 2021-12-12 ENCOUNTER — Encounter: Admit: 2021-12-12 | Discharge: 2021-12-12 | Payer: PRIVATE HEALTH INSURANCE | Primary: Registered Nurse

## 2021-12-12 MED ORDER — POTASSIUM CHLORIDE CRYS ER 20 MEQ PO TBCR: 20.0000 meq | EXTENDED_RELEASE_TABLET | Freq: Every day | ORAL | 3 refills | Status: DC

## 2021-12-12 NOTE — Home Health (Signed)
PMHx: . Anemia     . Arthritis     . Diabetes (HCC)     . History of colon polyps     . HTN (hypertension)     . Hyperlipemia     . Menopause     . Serum calcium elevated     . Stroke [facility])   SUBJECTIVE:Pt reports she is having pain and her neck brace is hurting her ears. Pt underwent cervical three, four,five and six laminectomy fusion on 5/8 and released home on 5/10.   LIVING SITUATION: Lives with daughter in a single story home with 3 steps to enter. Home is very cluttered with very narrow walking pathways to get around home with a small dog for tripping hazard  REQUIRES CAREGIVER ASSISTANCE FOR: Pts daughter asisst with meals, ambulation, ADL's, medications and transportation to appointments  PLOF: ModI with ADL's and ambulation.   MEDICATIONS REVIEWED AND UPDATED: New medication, Oxycodone, medications updated  NEXT MD APPT: 5/23 with surgeon  ROM:B LE's WNL  WOUNDS:Nonremoveable dressing intact to posterior neck, old drainage noted  BED MOBILITY:SBA, sleeps on couch, unable to get to bedroom  TRANSFERS:Min/Mod A with sit to stand from couch and toilet  GAIT: X37ft X2 with HHA.  Gait characterized by shuffling with flexed posture  STAIRS:NT  BALANCE: Pt scored 8/28 on Tinetti Balance Assessment placing patient at high risk for falls.   PATIENT EDUCATION PROVIDED THIS VISIT: safety, HEP, walking, deep breathing, cervical precautions, signs and symptoms of infection    HEP consisting of:  1. Walking every hour during the day with asisstance of her daughter   2. Deep breathing   Written instructions issued.  Patient/caregiver verbalized understanding.   PATIENT RESPONSE TO TREATMENT: Pt with increased c/o fatigue and pain in the neck after assessment  CONTINUED NEED FOR THE FOLLOWING SKILLS: HH PT is medically necessary to address pain, decreased ROM, decreased strength, impaired bed mobility, decreased independence with functional transfers, impaired gait, impaired stair negotiation, and impaired balance in  order to improve functional independence, quality of life, return to PLOF, and reduce the risk for falls.  PLAN: PT 1w1, 3w2 for cervical ROM, transfer training, gait training and stair training to improve pts safe mobility and safe egress from home for appointments.     DISCHARGE PLANNING DISCUSSED: Discharge to self and family under MD supervision once all goals have been met or patient has reached max potential.

## 2021-12-12 NOTE — Home Health (Signed)
Skilled reason for visit: Cervical myelopathy. Daughter expressed concern about patient BS being elevated since surgery. Daughter stated that patients BS has been in the 200-300s since she has been home and that abnormal for her and shes concerned because shes been home for a few days now. BS noted 367 at visit per meter. Call placed to endocrinologist Dr. Toniann Ket to discuss daughters concerns, NP stated she would speak with MD about patients status and call her daughter if he makes any changes.     Caregiver involvement: Daughter assist with meal prep, ADLs, and medications.    Medications reviewed and all medications are available in the home this visit.    The following education was provided regarding medications:  SN Instructed patient about the Eliquis ( apixaban ) this is helps to prevent that platelets in your blood from sticking together and forming a blood clot. Eliquis is used to lower the risk of stroke caused by a blood clot in people with a heart rhythm disorder called atrial fibrillation. Because Eliquis keeps your blood from coagulating ( clotting ) to prevent unwanted blood clots, this medicine can also make it easier for you to bleed, even from a minor injury such as a fall or a bump on the head. Do not stop taking Eliquis unless your doctor tells you to. Stopping suddenly can increase your risk of blood clot or stroke. Skilled nurse instructed patient about medication Oxycodone is used to treat moderate to severe pain. Take this medicine exactly as prescribed by your doctor. Never share the medicine with another person. Misuse narcotic pain medication can use addiction overdose, or death, especially in a child or other person using the medicine without a prescription. Common Oxycodone side effects may include: mild drowsiness, headache, dizziness, tired feeling; stomach pain, nausea, vomiting, constipation, loss of appetite, dry mouth, or mild itching.      MD notified of any discrepancies/look  a-like medications/medication interactions: nnone  Medications are effective at this time.      Home health supplies by type and quantity ordered/delivered this visit include: n/a    Patient education provided this visit: Instructed patient/caregiver to take exact amount of narcotics prescribed, signs and symptoms of oversedation, notify SN/PT if oversedated.  May cause constipation, notify SN/PT if no BM x 3 days.   Instructed patient/caregiver that hypoglycemic medications may result in lower blood sugars than body is accustomed to, notify SN/PT of dizziness, confusion, nervousness, or any other symptoms of low blood sugar.    Instructed patient/caregiver to notify SN/PT of signs and symptoms of anticoagulant adverse effects including bleeding gums, blood in urine or stool, easily bruising.  Instructed on importance of fall prevention due to risk for bleeding.        Sharps education provided: Clinician instructed patient/CG on proper disposal of sharps: Containers should be made of hard plastic, be puncture-resistant and leakproof, such as a laundry detergent or bleach bottle. When the container is  full, it should be sealed with tape and labeled   DO NOT RECYCLE prior to discarding in the regular trash.       Patient level of understanding of education provided: Patient and caregiver verbalized understanding      Skilled Care Performed this visit: SN assessment, post surgical teaching, incision assessment    Patient response to procedure performed:  Patient tolerated w/o any increased level of pain      Agency Progress toward goals: progressing    Patient's Progress towards personal goals: progressing  Home exercise program: Take all medications as prescribed, follow all fall precaution measures, attend all future medical appointments      Continued need for the following skills: Nursing, PT, OT, HHA    Plan for next visit: SN assessment    Patient and/or caregiver notified and agrees to changes in the Plan  of Care: Yes.     The following discharge planning was discussed with the pt/caregiver: Discharge when goals are met, patient/caregiver able to manage disease process, medications, and pain

## 2021-12-13 ENCOUNTER — Encounter: Payer: PRIVATE HEALTH INSURANCE | Primary: Registered Nurse

## 2021-12-13 NOTE — Home Health (Addendum)
SUBJECTIVE: Pt reports she is very tired today and does not know how much she can do.  Pt reports she had to go to bathroom when LPTA arrived.  CAREGIVER INVOLVEMENT/ASSISTANCE NEEDED FOR: pts dtr present during PT session and assist pt with all needs prn.  .  OBJECTIVE: See interventions.  PATIENT EDUCATION PROVIDED THIS VISIT: Pt instructed to continue HEP 2x/day and amb in home/standing with assist every 1-2 waking hours.  Reviewed neck precautions and lifting with pt.  PATIENT RESPONSE TO EDUCATION PROVIDED: Pt requires cuing to safely manuver around obstacles and follow neck precautions.  PATIENT RESPONSE TO TREATMENT: Pt requires frequent rest breaks throughout PT session secondary to c/o fatigue.  Pt had to use bathroom when LPTA arrived and pts dtr assisted pt with HHA in home secondary to limited space.    .  ASSESSMENT OF PROGRESS TOWARD GOALS:  Pt continues to have difficulty with transfers and amb .  Pt amb to/from bathroom with HHA of pts dtr secondary to narrow passageways in which walker will not fit.  PLAN FOR NEXT VISIT: Will plan to amb pt with FWW next visit in home pending pts dtr is able to make large enough pathway for walker.  THE FOLLOWING DISCHARGE PLANNING WAS DISCUSSED WITH THE PATIENT/CAREGIVER:Pt is scheduled to continue PT 3w2.  Reviewed POC and PT goals with pt/pts dtr.

## 2021-12-13 NOTE — Telephone Encounter (Signed)
Call placed to patient, ID verified x 2. Patient is s/p cervical laminectomy/ fusion with Dr. Carney Living, [facility] 12/09/2021. She denies chest pain, shortness of breath, nausea, vomiting or chills. Per daughter mom has been running low grade temp of 100. When asked if patient had been using incentive spirometer, daughter stated that it was broken as it did not do anything at all when they blew into it. Education provided that patient must breath into the ics 10 times hourly to ensure that lungs are expanded and temp will go down. Temp is low grade because her mom is not expanding her lungs at this time. Patient and daughter verbalized understanding. Patient states that pain is controlled taking her pain medication as prescribed.She denies any difficulty with swallowing. She denies any numbness or tingling in her upper extremities, she does state that she still has some weakness in her grip that was present prior to surgery. She continues to wear her collar. She is up ambulating and icing. She denies difficulty with bowel or bladder. Her dressing is described as clean, dry and intact. Home health and home physical therapy have been out to the home to see her. Overall she is doing well. She has no questions or concerns at this time. She will follow up with Dr. Carney Living in two weeks or sooner if needed.

## 2021-12-16 ENCOUNTER — Encounter: Admit: 2021-12-16 | Discharge: 2021-12-16 | Payer: PRIVATE HEALTH INSURANCE | Primary: Physician Assistant

## 2021-12-16 NOTE — Home Health (Addendum)
 Skilled reason for visit: Cervical myelopathy, dressing changed, follow up appointment with Dr. Darice Edelman on 12/24/21. Follow up conversation with daughter regarding concern about patients elevated blood sugar last week. Daughter stated endocrinologist increased humalog  75-25 to 30 units for 2 days and then to resume 20 units afterwards. Patient BG 91 during visit. Daughter stated she is waiting to hear back from endocrinologist on if patient should resume taking Bydureon as she has not taken since she has been back from the hospital. Patient noted with with two skin tears and two small fluid filled blisters to bilateral buttocks. Educated daughter on how to apply calmoseptine to buttocks and for patient to turn and reposition Q2H while sitting in chair. Daughter verbalized understanding.     Caregiver involvement: daughter assist with ADLs, meal prep , medications.    Medications reviewed and all medications are available in the home this visit.    The following education was provided regarding medications:  SN instructed patient and caregiver that Plavix  is used to prevent heart attacks and strokes in persons with heart disease, recent stroke and/or blood circulation disease (peripheral vascular disease). Easy bleeding/bruising, stomach upset/pain, diarrhea, and constipation may occur.  .    MD notified of any discrepancies/look a-like medications/medication interactions: none  Medications are effective at this time.      Home health supplies by type and quantity ordered/delivered this visit include: n/a    Patient education provided this visit: Instructed in materials used in wound care. However, even with proper treatment, a wound infection may occur. Check the wound daily for signs of infection like increased drainage or bleeding from the wound that won't stop with direct pressure, redness in or around the wound, foul odor or pus coming from the wound, increased swelling around the wound and ever above 101.98F or  shaking chills.    Sharps education provided: Clinician instructed patient/CG on proper disposal of sharps: Containers should be made of hard plastic, be puncture-resistant and leakproof, such as a laundry detergent or bleach bottle. When the container is  full, it should be sealed with tape and labeled   DO NOT RECYCLE prior to discarding in the regular trash.       Patient level of understanding of education provided: Patient and caregiver verbalized understanding      Skilled Care Performed this visit: incision care/assessment    Patient response to procedure performed:  Patient tolerated w/o any increased level of pain      Agency Progress toward goals: progressing    Patient's Progress towards personal goals: progressing    Home exercise program: Take all medications as prescribed, follow all fall precaution measures, attend all future medical appointments      Continued need for the following skills: Nursing, Physical Therapy, Occupational Therapy and Home Health Aide.    Plan for next visit: incision assessment, SN assessment    Patient and/or caregiver notified and agrees to changes in the Plan of Care: Yes.     The following discharge planning was discussed with the pt/caregiver: Discharge when goals are met, patient/caregiver able to manage disease process, medications, and pain

## 2021-12-16 NOTE — Home Health (Signed)
 SUBJECTIVE: Pt reports she is very tired and sore and has to use the restroom when LPTA arrived.  CAREGIVER INVOLVEMENT/ASSISTANCE NEEDED FOR: pts dtr present during PT session and assist pt with all needs prn.  .  OBJECTIVE: See interventions.  PATIENT EDUCATION PROVIDED THIS VISIT: Pt instructed to continue HEP 2x/day to BLEs and amb in home with assist every 2 hrs.  PATIENT RESPONSE TO EDUCATION PROVIDED: Pt demonstrates a fair understanding of HEP in sitting.  PATIENT RESPONSE TO TREATMENT: Pt requires frequent rest breaks throughout PT session and displays delay tactics to initiate participation in PT.  .  ASSESSMENT OF PROGRESS TOWARD GOALS: Pt requires HHA to amb in home secondary to narrow passage ways and clutter.  Pts dtr reporting she will clear pathway by next PT visit.  Pt requires extra time/effort with all activities secondary to c/o fatigue and weakness.  Aaron Aas  PLAN FOR NEXT VISIT: Will plan to attempt amb with FWW in home pending pts dtr clears a path for walker in home.  THE FOLLOWING DISCHARGE PLANNING WAS DISCUSSED WITH THE PATIENT/CAREGIVER:Pt is scheduled for 2 more vists this week then 3w1 next week.

## 2021-12-18 ENCOUNTER — Encounter: Admit: 2021-12-18 | Discharge: 2021-12-18 | Payer: PRIVATE HEALTH INSURANCE | Primary: Registered Nurse

## 2021-12-18 NOTE — Home Health (Signed)
SUBJECTIVE: Pt reports she needs to use the bathroom and c/o fatigue after returning from bathroom with pts dtr.  CAREGIVER INVOLVEMENT/ASSISTANCE NEEDED FOR: pts dtr assisted with all needs prn and in home during PT session.  .  OBJECTIVE: See interventions.  PATIENT EDUCATION PROVIDED THIS VISIT: Pt instructed to continue HEP 2x/day and amb /stand every waking hour.  PATIENT RESPONSE TO EDUCATION PROVIDED: Pt requires frequent rest breaks throughout PT session secondary to c/o fatigue.    PATIENT RESPONSE TO TREATMENT: Pt had limited participation in PT today secondary to bathroom issues and c/o fatigue after returning from bathroom.  .  ASSESSMENT OF PROGRESS TOWARD GOALS: Pt had bowel problems when LPTA arrived going to the bathroom when LPTA arrived.  Pt amb with HHA of pts dtr vs walker secondary to limited space.  Recommended again clearing pathway in home for safe amb.   Marland Kitchen  PLAN FOR NEXT VISIT: Will plan to continue PT working towards PT goals and attempt stair amb next visit with weather permitting.  THE FOLLOWING DISCHARGE PLANNING WAS DISCUSSED WITH THE PATIENT/CAREGIVER: Pt is scheduleduled to continue PT 1 more visit this week and 3w1.  Reassessment next week.

## 2021-12-18 NOTE — Telephone Encounter (Signed)
Call to Pharmacy have received the refill for the Potassium.  Need refill for the Spirolactone only. ?

## 2021-12-19 ENCOUNTER — Encounter: Payer: PRIVATE HEALTH INSURANCE | Primary: Registered Nurse

## 2021-12-20 ENCOUNTER — Encounter: Admit: 2021-12-20 | Discharge: 2021-12-20 | Payer: PRIVATE HEALTH INSURANCE | Primary: Registered Nurse

## 2021-12-20 NOTE — Home Health (Signed)
SUBJECTIVE: Pt reports she was feeling a little better today but has not been up moving around much.  CAREGIVER INVOLVEMENT/ASSISTANCE NEEDED FOR: pts dtr assist pt with all needs prn and present during PT session.  .  OBJECTIVE: See interventions.  PATIENT EDUCATION PROVIDED THIS VISIT: Pt instructed to continue HEP 2x/day and amb with assist in home/standing with walker every waking hour.  PATIENT RESPONSE TO EDUCATION PROVIDED: Pt reports a good compliance with sitting HEP but reports she does not stand/walk much during the day secondary to limited space.  PATIENT RESPONSE TO TREATMENT: Pt requires frequent rest breaks throughout PT session secondary to c/o fatigue.  .  ASSESSMENT OF PROGRESS TOWARD GOALS: Pt continues to have much difficulty with transfers and amb.  Pt reports a fair compliance with HEP and reviewed with pt importance of compliance.  Marland Kitchen  PLAN FOR NEXT VISIT: Will plan to continue PT working towards improving LE strength and gait ability.  THE FOLLOWING DISCHARGE PLANNING WAS DISCUSSED WITH THE PATIENT/CAREGIVER: Pt is scheduled to continue PT 3w1 with reassessment next week.

## 2021-12-23 ENCOUNTER — Encounter: Admit: 2021-12-23 | Discharge: 2021-12-23 | Payer: PRIVATE HEALTH INSURANCE | Primary: Registered Nurse

## 2021-12-23 NOTE — Home Health (Signed)
 SUBJECTIVE: Pt reports she is doing okay but gets tired easily.  CAREGIVER INVOLVEMENT/ASSISTANCE NEEDED FOR: Pts dtr assist pt with all needs prn.  .  OBJECTIVE: See interventions.  PATIENT EDUCATION PROVIDED THIS VISIT: Pt instructed to continue HEP 2x/day to BLEs in sitting.  Demonstrated safe shower chair transfer with CNA and pts dtr for safe positionining/technique.  PATIENT RESPONSE TO EDUCATION PROVIDED: Pt requires extra time/effort with all transfers/amb.  pts dtr verbalizes a good understanding of shower transfers.  Pts dtr reports when she gets time she will try to clear a pathway for pt to use walker for safety.  PATIENT RESPONSE TO TREATMENT: Pt requires frequent rest breaks throughout PT session and requires max cuing to safely manuver around obstacles with amb with HHA secondary to narrow pathways.  .  ASSESSMENT OF PROGRESS TOWARD GOALS: Pt reports she was trying to get up from supine position on couch and slid off couch.  Pt reports pts dtr lifted her off floor back to couch with no injuries.  Reviewed with pt/pts dtr importance of pt sitting back against back of couch before attempting to bring legs around to floor with sup to sit transfers.  Pt amb to bathroom for shower and demonstrated proper technique to assist pt in/out of shower.  Aaron Aas  PLAN FOR NEXT VISIT: Will plan for a sup visit/PFA next visit.  THE FOLLOWING DISCHARGE PLANNING WAS DISCUSSED WITH THE PATIENT/CAREGIVER:Pt is scheduled for PT reassessment next visit.

## 2021-12-24 ENCOUNTER — Ambulatory Visit (INDEPENDENT_AMBULATORY_CARE_PROVIDER_SITE_OTHER): Payer: Medicare Other | Admitting: Podiatry

## 2021-12-24 DIAGNOSIS — E1142 Type 2 diabetes mellitus with diabetic polyneuropathy: Secondary | ICD-10-CM

## 2021-12-24 DIAGNOSIS — M79675 Pain in left toe(s): Secondary | ICD-10-CM | POA: Diagnosis not present

## 2021-12-24 DIAGNOSIS — B351 Tinea unguium: Secondary | ICD-10-CM | POA: Diagnosis not present

## 2021-12-24 DIAGNOSIS — L84 Corns and callosities: Secondary | ICD-10-CM | POA: Diagnosis not present

## 2021-12-24 DIAGNOSIS — M79674 Pain in right toe(s): Secondary | ICD-10-CM | POA: Diagnosis not present

## 2021-12-24 DIAGNOSIS — D229 Melanocytic nevi, unspecified: Secondary | ICD-10-CM

## 2021-12-24 DIAGNOSIS — L89891 Pressure ulcer of other site, stage 1: Secondary | ICD-10-CM | POA: Diagnosis not present

## 2021-12-24 NOTE — Patient Instructions (Signed)
DRESSING CHANGES right foot:   PHARMACY SHOPPING LIST: Saline or Wound Cleanser for cleaning wound 2 x 2 inch sterile gauze for cleaning wound Betadine Solution  A. IF DISPENSED, WEAR SURGICAL SHOE OR WALKING BOOT AT ALL TIMES.  B. IF PRESCRIBED ORAL ANTIBIOTICS, TAKE ALL MEDICATION AS PRESCRIBED UNTIL ALL ARE GONE.  C. IF DOCTOR HAS DESIGNATED NONWEIGHTBEARING STATUS, PLEASE ADHERE TO INSTRUCTIONS.  1. KEEP right foot DRY AT ALL TIMES!!!!  2. CLEANSE ULCER WITH SALINE OR WOUND CLEANSER.  3. DAB DRY WITH GAUZE SPONGE.  4. APPLY A LIGHT AMOUNT OF Betadine Solution TO BASE OF ULCER.  5. APPLY FABRIC BAND-AID TO EACH TOE. APPLY FOAM TOE SEPARATOR BETWEEN TOES.  6. WEAR SURGICAL SHOE/BOOT DAILY AT ALL TIMES. IF SUPPLIED, WEAR HEEL PROTECTORS AT ALL TIMES WHEN IN BED.  DO NOT WALK BAREFOOT!!! IF YOU EXPERIENCE ANY FEVER, CHILLS, NIGHTSWEATS, NAUSEA OR VOMITING, ELEVATED OR LOW BLOOD SUGARS, REPORT TO EMERGENCY ROOM.  IF YOU EXPERIENCE INCREASED REDNESS, PAIN, SWELLING, DISCOLORATION, ODOR, PUS, DRAINAGE OR WARMTH OF YOUR FOOT, REPORT TO EMERGENCY ROOM.

## 2021-12-25 ENCOUNTER — Encounter: Admit: 2021-12-25 | Discharge: 2021-12-25 | Payer: PRIVATE HEALTH INSURANCE | Primary: Physician Assistant

## 2021-12-25 ENCOUNTER — Encounter: Payer: PRIVATE HEALTH INSURANCE | Primary: Registered Nurse

## 2021-12-25 ENCOUNTER — Other Ambulatory Visit: Payer: Medicare Other

## 2021-12-25 ENCOUNTER — Encounter: Payer: Self-pay | Admitting: Podiatry

## 2021-12-25 NOTE — Home Health (Signed)
SUBJECTIVE: Pt states her appointment with the surgeon went okay yesterday. Denies any medication changes. Reports she goes back in 4 weeks for another check.   CAREGIVER INVOLVEMENT/ASSISTANCE NEEDED FOR: Pts daughter assists with meals, medications and transportation to appointments.   HOME HEALTH SUPPLIES BY TYPE AND QUANTITY ORDERED/DELIVERED THIS VISIT INCLUDE: none  OBJECTIVE:  See interventions.  MEDICATIONS: Medications reconciled and all medications are available in the home this visit.  The following education was provided regarding medications, medication interactions, and look a like medications: Continue medication as prescribed by MD. Medications are effective at this time.  PATIENT RESPONSE TO TREATMENT:  No c/o increased pain. C/o of fatigue after walking outside.   PATIENT LEVEL OF UNDERSTANDING OF EDUCATION PROVIDED: Instructed pt/daughter to continue to clear home for safer walkways and safe egress from home. Needs ongoing encouragement.   ASSESSMENT OF PROGRESS TOWARD GOALS: Pt is making slow progress towards goals and continues to be limited by surroundings/limited space in home. Pt was able to finally walk outside with assistance to improve ability to safely egress the home for appointments.   CONTINUED NEED FOR THE FOLLOWING SKILLS: Pt continues to have B LE weakness, unsteady gait and remains limited by pain.   PLAN FOR NEXT VISIT: Con't to progress distance with ambulation and stair training for improved safety with egress from home. Received order for continuing PT 2w2.   THE FOLLOWING DISCHARGE PLANNING WAS DISCUSSED WITH THE PATIENT/CAREGIVER: DC to family under MD supervision when goals met/max rehab potential

## 2021-12-25 NOTE — Progress Notes (Signed)
Subjective:  Patient ID: Julie Elliott, female    DOB: 1956/01/24,  MRN: [id number]  Tacey Dimaggio presents to clinic today for at risk foot care with history of diabetic neuropathy and painful elongated mycotic toenails 1-5 bilaterally which are tender when wearing enclosed shoe gear. Pain is relieved with periodic professional debridement.  Patient and son state her feet look better. Her aide has been applying lotion between her toes. Our medical assistant noticed breakdown of right 5th digit interdigitally. Patient was unaware.  She still has not seen Dermatology for multiple nevi b/l LE.  Son, Lowella Kindley, is present during today's visit. He is very involved in his mother's care. She does have any aide that comes and helps her with ADLs daily.  PCP is Rick Duff, MD , and last visit was August 20, 2021.  Allergies  Allergen Reactions   Loratadine-Pseudoephedrine Er Hives    Claritin-D    Review of Systems: Negative except as noted in the HPI.  Objective: General: Patient is a pleasant [name] in NAD. AAO x 3.   Neurovascular Examination: CFT <3 seconds b/l LE. Palpable DP pulse(s) b/l LE. Palpable PT pulse(s) b/l LE. Pedal hair absent. No pain with calf compression b/l. Lower extremity skin temperature gradient within normal limits. Nonpitting edema noted BLE. No cyanosis or clubbing noted b/l LE.  Pt has subjective symptoms of neuropathy. Protective sensation intact 5/5 intact bilaterally with 10g monofilament b/l. Vibratory sensation intact b/l.  Dermatological:  Pedal skin is warm and supple b/l LE. oenails 1-5 bilaterally elongated, discolored, dystrophic, thickened, and crumbly with subungual debris and tenderness to dorsal palpation. Multiple nevi noted b/l LE, medial aspect right foot and medial aspect right 5th digit. Hyperkeratotic lesion(s) submet head 5 b/l.  No erythema, no edema, no drainage, no fluctuance.  Interdigital maceration webspaces  1-4 bilaterally.   Stage 1 pressure sore noted interdigitally medial aspect of right 5th digit. No erythema, no edema, no drainage, no fluctuance. No evidence of deep space infection noted today.  Musculoskeletal:  Muscle strength 5/5 to all lower extremity muscle groups bilaterally. No pain, crepitus or joint limitation noted with ROM bilateral LE. No gross bony deformities bilaterally.    Latest Ref Rng & Units 08/20/2021    4:42 PM 03/06/2021    4:00 PM  Hemoglobin A1C  Hemoglobin-A1c 4.0 - 5.6 % 6.4   7.0     Assessment/Plan: 1. Pain due to onychomycosis of toenails of both feet   2. Pressure injury of toe of right foot, stage 1   3. Callus   4. Multiple nevi   5. Diabetic peripheral neuropathy associated with type 2 diabetes mellitus ([place])     -Patient was evaluated and treated. All patient's and/or POA's questions/concerns answered on today's visit. -Discussed pressure sore right 5th toe. Area painted with betadine.Son given written instructions for daily local wound care. Paint webspace with betadine and apply dot band-aid to 4th and 5th digits. Use foam toe spacer daily. Advised she wear open toe shoes to prevent pressure on toes. Son related understanding.. -Toenails 1-5 b/l were debrided in length and girth with sterile nail nippers and dremel without iatrogenic bleeding.  -Dermatology consultation for evaluation/treatment of multiple nevi of both LE. -Patient scheduled to see Dr. Lanae Crumbly in 2 weeks for follow up of pressure sore right 5th toe. -Dispensed foam toe separator. Apply to 4th webspace right foot every morning. Remove every evening. -Patient/POA to call should there be question/concern in the interim. -  Son may contact me through [place] or call office if he has any problems.   Return in about 2 weeks (around 01/07/2022).  Marzetta Board, DPM

## 2021-12-26 ENCOUNTER — Encounter: Admit: 2021-12-26 | Discharge: 2021-12-26 | Payer: PRIVATE HEALTH INSURANCE | Primary: Registered Nurse

## 2021-12-26 ENCOUNTER — Encounter: Payer: PRIVATE HEALTH INSURANCE | Primary: Physician Assistant

## 2021-12-26 ENCOUNTER — Emergency Department: Admit: 2021-12-27 | Payer: PRIVATE HEALTH INSURANCE | Primary: Physician Assistant

## 2021-12-26 ENCOUNTER — Inpatient Hospital Stay
Admission: EM | Admit: 2021-12-26 | Discharge: 2022-01-06 | Disposition: A | Discharge status: Rehabilitation Facility | Payer: PRIVATE HEALTH INSURANCE | Admitting: Hospitalist

## 2021-12-26 DIAGNOSIS — I6381 Other cerebral infarction due to occlusion or stenosis of small artery: Secondary | ICD-10-CM

## 2021-12-26 DIAGNOSIS — W19XXXA Unspecified fall, initial encounter: Secondary | ICD-10-CM

## 2021-12-26 DIAGNOSIS — L89150 Pressure ulcer of sacral region, unstageable: Principal | ICD-10-CM

## 2021-12-26 NOTE — ED Provider Notes (Signed)
[facility] EMERGENCY DEPT  EMERGENCY DEPARTMENT ENCOUNTER       Pt Name: [name] Short  MRN: [id number]  Birthdate 05-22-1956  Date of evaluation: 12/26/2021  Provider: Marilynne Drivers, MD   PCP: Uvaldo Bristle, APRN - NP  Note Started: 12:44 PM 12/26/21     CHIEF COMPLAINT       Chief Complaint   Patient presents with    Fall        HISTORY OF PRESENT ILLNESS: 1 or more elements      History From: Patient and Patient's Daughter  None     [name] Short is a 66 y.o. female who presents with weakness in the lower extremities.  Bilateral lower extremity she is incontinent at baseline, also has saddle anesthesia at baseline, recently had a C3-C4-C5-C6.  Laminectomy fusion, she is complaining of some weakness in her lower extremities, to the point where she had to slowly sit to the ground.  Denies any fevers, any chills, increasing pain at the surgical site, no warmth or drainage from the site. Patient states she does have some chronic weakness in the R upper extremity that may have worsened also reports some parasthesia in the right upper extremity. No abdominal pain, vomiting, diarrhea. No productive cough. She is on insulin for glycemic management of her diabetes.     Nursing Notes were all reviewed and agreed with or any disagreements were addressed in the HPI.     REVIEW OF SYSTEMS      Review of Systems     Positives and Pertinent negatives as per HPI.    PAST HISTORY     Past Medical History:  Past Medical History:   Diagnosis Date    Anemia     Arthritis     Chronic pain     Legs and Shoulders    Diabetes (HCC)     Exposure to asbestos     History of blood transfusion     History of colon polyps     HTN (hypertension)     Hx of blood clots     DVT    Hyperlipemia     Menopause     Pulmonary nodule     Serum calcium elevated     Sleep apnea     not using cpap    Stroke [facility])          Past Surgical History:  Past Surgical History:   Procedure Laterality Date    CESAREAN SECTION  1987    COLONOSCOPY N/A 01/13/2017    COLONOSCOPY  performed by Levan Hurst, MD at [facility]    COLONOSCOPY      DILATION AND CURETTAGE OF UTERUS  1995    SPINE SURGERY N/A 12/09/2021    CERVICAL THREE/FOUR/FIVE/SIX LAMINECTOMY FUSION; C-ARM; STRYKER; EXT BONE GROWTH STIM; 23 HR performed by Pamalee Leyden, MD at [facility] MAIN OR       Family History:  Family History   Problem Relation Age of Onset    Diabetes Other 26        parent, NOS    Cancer Father     Hypertension Other 35        parent,NOS    Heart Disease Other 56        parent, NOS    Lung Disease Father        Social History:  Social History     Tobacco Use    Smoking status: Every Day  Packs/day: 1.00     Types: Cigarettes, Cigars     Last attempt to quit: 08/2019     Years since quitting: 2.3    Smokeless tobacco: Never   Vaping Use    Vaping Use: Never used   Substance Use Topics    Alcohol use: Yes     Comment: occ    Drug use: Never       Allergies:  Allergies   Allergen Reactions    Lactose Other (See Comments)     Intolerance-Pt denies       CURRENT MEDICATIONS      Previous Medications    ACETAMINOPHEN (TYLENOL) 500 MG TABLET    Take 1 tablet by mouth every 4 hours as needed for Fever or Pain    ASCORBIC ACID (VITAMIN C) 250 MG TABLET    Take 1 tablet by mouth daily    ASPIRIN (VAZALORE) 81 MG CAPS    Take 1 capsule by mouth    BYDUREON BCISE 2 MG/0.85ML INJECTION    Inject 2 mg into the skin every 7 days every Sunday    CLOPIDOGREL (PLAVIX) 75 MG TABLET    Take 1 tablet by mouth daily    CONTINUOUS BLOOD GLUC SENSOR (FREESTYLE LIBRE 3 SENSOR) MISC    MISCELLANEOUS CHANGE NEW SENSOR EVERY 14 DAYS    CYCLOBENZAPRINE (FLEXERIL) 5 MG TABLET    Take 5 mg by mouth daily as needed    FERROUS SULFATE (IRON 325) 325 (65 FE) MG TABLET    Take 1 tablet by mouth daily    FUROSEMIDE (LASIX) 40 MG TABLET    furosemide 40 mg tablet   TAKE 1 TABLET BY MOUTH EVERY DAY    GABAPENTIN (NEURONTIN) 100 MG CAPSULE    Take 1 capsule by mouth as needed.    GLUCAGON, RDNA, (GLUCAGON EMERGENCY) 1 MG KIT    INJECT 1  MILLIGRAM(S) INJECTION EVERY DAY AS NEEDED FOR LOW BLOOD DUGAR    HUMALOG MIX 75/25 KWIKPEN (75-25) 100 UNIT/ML SUPN INJECTION PEN    Inject 0.17 mLs into the skin 2 times daily (with meals)    LISINOPRIL (PRINIVIL;ZESTRIL) 40 MG TABLET    Take 1 tablet by mouth daily    PANTOPRAZOLE (PROTONIX) 40 MG TABLET    Take 40 mg by mouth daily as needed (acid reflux, heartburn)    POLYETHYLENE GLYCOL (GLYCOLAX) 17 G PACKET    Take 17 g by mouth 2 times daily.         PHYSICAL EXAM      ED Triage Vitals   Enc Vitals Group      BP       Pulse       Resp       Temp       Temp src       SpO2       Weight       Height       Head Circumference       Peak Flow       Pain Score       Pain Loc       Pain Edu?       Excl. in GC?               Physical Exam  Vitals and nursing note reviewed.   Constitutional:       Appearance: Normal appearance.   HENT:      Head: Normocephalic and atraumatic.   Neck:  Comments: There is a surgical dressing for a posterior midline neck incision with no fluctuance, no induration or erythema, the thoracic and lumbar spine are nontender on exam  Cardiovascular:      Rate and Rhythm: Regular rhythm. Tachycardia present.      Heart sounds: No murmur heard.    No friction rub. No gallop.   Pulmonary:      Effort: Pulmonary effort is normal. No respiratory distress.      Breath sounds: Normal breath sounds. No wheezing or rales.   Abdominal:      General: Abdomen is flat.      Palpations: Abdomen is soft.   Skin:     General: Skin is warm and dry.      Capillary Refill: Capillary refill takes less than 2 seconds.      Comments: There is stage II sacral decubitus ulcers present   Neurological:      Mental Status: She is alert and oriented to person, place, and time. Mental status is at baseline.      Comments: Patient has bilateral decreased strength in the lower extremities, she also has bilateral mildly decreased upper extremity strength worse on the right.  Sensation seems to be grossly intact  throughout the lower extremities and upper extremities.     Psychiatric:         Mood and Affect: Mood normal.         Behavior: Behavior normal.          DIAGNOSTIC RESULTS   LABS:     Recent Results (from the past 24 hour(s))   EKG 12 Lead    Collection Time: 12/26/21  8:27 PM   Result Value Ref Range    Ventricular Rate 115 BPM    Atrial Rate 115 BPM    P-R Interval 150 ms    QRS Duration 88 ms    Q-T Interval 324 ms    QTc Calculation (Bazett) 448 ms    P Axis 74 degrees    R Axis -18 degrees    T Axis 69 degrees    Diagnosis       Sinus tachycardia  Otherwise normal ECG  When compared with ECG of 22-Apr-2018 08:27,  Vent. rate has increased BY  39 BPM  Non-specific change in ST segment in Lateral leads  Confirmed by Isabelle Course, MD, ----- ([id number]) on 12/27/2021 12:28:05 PM     CBC with Auto Differential    Collection Time: 12/26/21  8:38 PM   Result Value Ref Range    WBC 10.9 4.6 - 13.2 K/uL    RBC 3.99 (L) 4.20 - 5.30 M/uL    Hemoglobin 10.0 (L) 12.0 - 16.0 g/dL    Hematocrit 81.1 (L) 35.0 - 45.0 %    MCV 79.4 78.0 - 100.0 FL    MCH 25.1 24.0 - 34.0 PG    MCHC 31.5 31.0 - 37.0 g/dL    RDW 91.4 (H) 78.2 - 14.5 %    Platelets 368 135 - 420 K/uL    MPV 9.4 9.2 - 11.8 FL    Nucleated RBCs 0.0 0 PER 100 WBC    nRBC 0.00 0.00 - 0.01 K/uL    Neutrophils % 84 (H) 40 - 73 %    Lymphocytes % 4 (L) 21 - 52 %    Monocytes % 10 3 - 10 %    Eosinophils % 1 0 - 5 %    Basophils % 0 0 - 2 %  Immature Granulocytes 1 (H) 0.0 - 0.5 %    Neutrophils Absolute 9.1 (H) 1.8 - 8.0 K/UL    Lymphocytes Absolute 0.4 (L) 0.9 - 3.6 K/UL    Monocytes Absolute 1.1 0.05 - 1.2 K/UL    Eosinophils Absolute 0.1 0.0 - 0.4 K/UL    Basophils Absolute 0.0 0.0 - 0.1 K/UL    Absolute Immature Granulocyte 0.1 (H) 0.00 - 0.04 K/UL    Differential Type AUTOMATED     Comprehensive Metabolic Panel    Collection Time: 12/26/21  8:38 PM   Result Value Ref Range    Sodium 137 136 - 145 mmol/L    Potassium 6.2 (HH) 3.5 - 5.5 mmol/L    Chloride 106 100 -  111 mmol/L    CO2 26 21 - 32 mmol/L    Anion Gap 5 3.0 - 18 mmol/L    Glucose 385 (H) 74 - 99 mg/dL    BUN 21 (H) 7.0 - 18 MG/DL    Creatinine 0.98 0.6 - 1.3 MG/DL    Bun/Cre Ratio 21 (H) 12 - 20      Est, Glom Filt Rate >60 >60 ml/min/1.71m2    Calcium 10.7 (H) 8.5 - 10.1 MG/DL    Total Bilirubin 0.5 0.2 - 1.0 MG/DL    ALT 34 13 - 56 U/L    AST 60 (H) 10 - 38 U/L    Alk Phosphatase 121 (H) 45 - 117 U/L    Total Protein 6.4 6.4 - 8.2 g/dL    Albumin 2.7 (L) 3.4 - 5.0 g/dL    Globulin 3.7 2.0 - 4.0 g/dL    Albumin/Globulin Ratio 0.7 (L) 0.8 - 1.7     Troponin    Collection Time: 12/26/21  8:38 PM   Result Value Ref Range    Troponin, High Sensitivity 11 0 - 54 ng/L   Sedimentation Rate    Collection Time: 12/26/21  8:38 PM   Result Value Ref Range    Sed Rate, Automated 66 (H) 0 - 30 mm/hr   C-Reactive Protein    Collection Time: 12/26/21  8:38 PM   Result Value Ref Range    CRP 9.6 (H) 0 - 0.3 mg/dL   BMP    Collection Time: 12/26/21 10:00 PM   Result Value Ref Range    Sodium 141 136 - 145 mmol/L    Potassium 3.8 3.5 - 5.5 mmol/L    Chloride 111 100 - 111 mmol/L    CO2 26 21 - 32 mmol/L    Anion Gap 4 3.0 - 18 mmol/L    Glucose 398 (H) 74 - 99 mg/dL    BUN 20 (H) 7.0 - 18 MG/DL    Creatinine 1.19 0.6 - 1.3 MG/DL    Bun/Cre Ratio 20 12 - 20      Est, Glom Filt Rate >60 >60 ml/min/1.18m2    Calcium 10.5 (H) 8.5 - 10.1 MG/DL   POCT Glucose    Collection Time: 12/27/21  5:25 AM   Result Value Ref Range    POC Glucose 211 (H) 70 - 110 mg/dL   CBC with Auto Differential    Collection Time: 12/27/21  7:52 AM   Result Value Ref Range    WBC 7.5 4.6 - 13.2 K/uL    RBC 3.65 (L) 4.20 - 5.30 M/uL    Hemoglobin 9.0 (L) 12.0 - 16.0 g/dL    Hematocrit 14.7 (L) 35.0 - 45.0 %    MCV 78.6 78.0 - 100.0  FL    MCH 24.7 24.0 - 34.0 PG    MCHC 31.4 31.0 - 37.0 g/dL    RDW 16.1 (H) 09.6 - 14.5 %    Platelets 328 135 - 420 K/uL    MPV 9.3 9.2 - 11.8 FL    Nucleated RBCs 0.0 0 PER 100 WBC    nRBC 0.00 0.00 - 0.01 K/uL    Neutrophils % 84  (H) 40 - 73 %    Lymphocytes % 7 (L) 21 - 52 %    Monocytes % 7 3 - 10 %    Eosinophils % 2 0 - 5 %    Basophils % 0 0 - 2 %    Immature Granulocytes 0 0.0 - 0.5 %    Neutrophils Absolute 6.3 1.8 - 8.0 K/UL    Lymphocytes Absolute 0.5 (L) 0.9 - 3.6 K/UL    Monocytes Absolute 0.5 0.05 - 1.2 K/UL    Eosinophils Absolute 0.1 0.0 - 0.4 K/UL    Basophils Absolute 0.0 0.0 - 0.1 K/UL    Absolute Immature Granulocyte 0.0 0.00 - 0.04 K/UL    Differential Type AUTOMATED     Basic Metabolic Panel    Collection Time: 12/27/21  7:52 AM   Result Value Ref Range    Sodium 145 136 - 145 mmol/L    Potassium 3.4 (L) 3.5 - 5.5 mmol/L    Chloride 115 (H) 100 - 111 mmol/L    CO2 27 21 - 32 mmol/L    Anion Gap 3 3.0 - 18 mmol/L    Glucose 180 (H) 74 - 99 mg/dL    BUN 15 7.0 - 18 MG/DL    Creatinine 0.45 0.6 - 1.3 MG/DL    Bun/Cre Ratio 20 12 - 20      Est, Glom Filt Rate >60 >60 ml/min/1.94m2    Calcium 10.6 (H) 8.5 - 10.1 MG/DL   POCT Glucose    Collection Time: 12/27/21 10:26 AM   Result Value Ref Range    POC Glucose 192 (H) 70 - 110 mg/dL   }        EKG: EKG interpreted by me reveals sinus tachycardia, no evidence of acute ischemia or arrhythmia, axis normal, intervals normal.     RADIOLOGY:  Non-plain film images such as CT, Ultrasound and MRI are read by the radiologist. Plain radiographic images are visualized and preliminarily interpreted by the ED Provider with the below findings:          Interpretation per the Radiologist below, if available at the time of this note:     MRI LUMBAR SPINE WO CONTRAST   Final Result      1. Motion degraded examination.   2. No acute abnormalities in the lumbar spine.   3. Multilevel degenerative disc and facet joint disease as discussed   -Moderate canal stenosis and slightly worsening trace anterolisthesis at L3-4.   Low-grade facet inflammation L3-4 right greater than left.   -Interbody osseous fusion L4-5 and L5-S1. Moderate canal stenosis L4-5 and mild   right lateral recesses stenosis  L5-S1.   -Multilevel foraminal stenosis most severe stenosis at right L5-S1.   4. Degenerative disc and facet joint disease lower thoracic spine with mild   canal stenosis T11-12 and severe foraminal stenosis.         MRI THORACIC SPINE WO CONTRAST   Final Result      1. Moderately motion degraded examination.   2. No evidence of acute abnormality in  the thoracic spine.   3. Mild thoracic spondylosis without high-grade spinal canal stenosis or cord   deformity.         MRI CERVICAL SPINE WO CONTRAST   Final Result      1. Postsurgical changes of laminectomy and fusion C3-C6. Postsurgical changes   posterior paraspinal soft tissue with fluid collection along the surgical scar,   likely seroma but cannot exclude infection based on imaging alone. No associated   mass effect on the thecal sac.   2.Multilevel degenerative disc disease and facet arthrosis.   -Moderate spinal canal stenosis at C2-3 and C6-7.   -Multilevel moderate and severe neural foraminal stenosis throughout the   cervical spine as discussed.   3. Laminectomy level C3-4 degenerative disc disease and central disc protrusion   with ventral cord compression but patent dorsal CSF space. No associated cord   signal abnormality   4. Myelomalacia changes at C4-5.   5. Enlarged multinodular thyroid gland.      XR CHEST (2 VW)   Final Result   No radiographic evidence of acute cardiopulmonary process.                PROCEDURES   Unless otherwise noted below, none  Procedures       CRITICAL CARE TIME     SCREENINGS          EMERGENCY DEPARTMENT COURSE and DIFFERENTIAL DIAGNOSIS/MDM   Vitals:    Vitals:    12/27/21 0930 12/27/21 0945 12/27/21 1000 12/27/21 1015   BP: (!) 111/52 (!) 115/54 (!) 118/59 (!) 115/52   Pulse:       Resp:       Temp:       TempSrc:       SpO2:                Patient was given the following medications:  Medications   insulin lispro (HUMALOG) injection vial 10 Units (10 Units SubCUTAneous Given 12/26/21 2311)   lactated ringers bolus (  IntraVENous Rate/Dose Verify 12/27/21 1002)   acetaminophen (TYLENOL) tablet 650 mg (650 mg Oral Given 12/27/21 0113)   LORazepam (ATIVAN) injection 1 mg (1 mg IntraVENous Given 12/27/21 0318)       CONSULTS: (Who and What was discussed)  IP WOUND CARE NURSE CONSULT TO EVAL      Chronic Conditions: Uncontrolled diabetes, stroke, cervical myelopathy    Social Determinants affecting Dx or Tx: None    Records Reviewed (source and summary of external notes): Nursing Notes, Old Medical Records, Previous electrocardiograms, Previous Radiology Studies, and Previous Laboratory Studies    CC/HPI Summary, DDx, ED Course, and Reassessment: See course    ED Course as of 12/27/21 1244   Thu Dec 26, 2021   2250 This is a 66 year old female who has a history of cervical spinal fusion done in early May, also is a diabetic, on insulin at home, presents with weakness in her bilateral lower extremities, on exam she has some mild weakness in the bilateral lower extremities, right worse than left, no spasticity, no fasciculations present, she is incontinent at baseline she has some stage II decubitus ulcers which are going to be seen by wound care, she denies any pain in her cervical spine, denies fevers denies redness or drainage and I do not see any on exam around the surgical site.    No history of spinal epidural abscess or spinal infection, however given the weakness in the bilateral lower extremities that caused  her to have to sit down to the ground earlier today, poorly controlled diabetic with a glucose of 400, I think imaging is indicated.    Of note the patient had a potassium of 6.2 on initial draw, redraw revealed a potassium of 3.8.  She has prerenal azotemia with a BUN of 20, given crystalloid.  I also gave her 10 units of regular insulin subcutaneous she normally takes 20, will recheck the glucose.    Troponin was negative.  Inflammatory markers pending.  CBC with no leukocytosis but there is a left shift with 84%  neutrophils.    EKG reveals sinus tachycardia, with normal PR, QRS, QTc, no evidence of acute ischemia.  Interpreted by me        Discussed the case with Dr. Carney Living, he recommends, MRI entire spine with contrast.  [SM]   2250 Chest x-ray interpreted by me, there is stable haziness on the right cardiac border from prior x-rays, patient has no respiratory symptoms at this time, I am not suspicious for pneumonia. [SM]      ED Course User Index  [SM] Marilynne Drivers, MD     Patient was signed out to Dr. Evalee Mutton, we discussed labs and pending imaging including MRI of the entire spine.  Patient found to have elevated inflammatory markers.  Remained stable at this time.  She was given 10 units of subcutaneous insulin, I have put in an order for repeat glucose    Disposition Considerations (Tests not done, Shared Decision Making, Pt Expectation of Test or Tx.):     FINAL IMPRESSION     1. Fall, initial encounter          12:44 PM : Pt care transferred to Dr. Lawerance Bach   ,ED provider. History of patient complaint(s), available diagnostic reports and current treatment plan has been discussed thoroughly.   Bedside rounding on patient occured : Yes  Intended disposition of patient TBD pending MRI studies and gait assessment  Pending diagnostics reports and/or labs (please list): MRI and gait assessment if normal     Dr. Pat Kocher assistance in completion of this plan is greatly appreciated but it should be noted that I will be the provider of record for this patient.      Medication List        CONTINUE taking these medications      FreeStyle Libre 3 Sensor Misc            ASK your doctor about these medications      acetaminophen 500 MG tablet  Commonly known as: TYLENOL     ascorbic acid 250 MG tablet  Commonly known as: VITAMIN C     Bydureon BCise 2 MG/0.85ML injection  Generic drug: Exenatide ER     clopidogrel 75 MG tablet  Commonly known as: PLAVIX     cyclobenzaprine 5 MG tablet  Commonly known as: FLEXERIL     ferrous  sulfate 325 (65 Fe) MG tablet  Commonly known as: IRON 325     furosemide 40 MG tablet  Commonly known as: LASIX     gabapentin 100 MG capsule  Commonly known as: NEURONTIN     Glucagon Emergency 1 MG Kit     HumaLOG Mix 75/25 KwikPen (75-25) 100 UNIT per ML Supn injection pen  Generic drug: insulin lispro protamine & lispro     lisinopril 40 MG tablet  Commonly known as: PRINIVIL;ZESTRIL     pantoprazole 40 MG tablet  Commonly known as:  PROTONIX     polyethylene glycol 17 g packet  Commonly known as: GLYCOLAX     [place] 81 MG Caps  Generic drug: Aspirin                DISCONTINUED MEDICATIONS:  Current Discharge Medication List          I am the Primary Clinician of Record.   Marilynne Drivers, MD (electronically signed)      (Please note that parts of this dictation were completed with voice recognition software. Quite often unanticipated grammatical, syntax, homophones, and other interpretive errors are inadvertently transcribed by the computer software. Please disregards these errors. Please excuse any errors that have escaped final proofreading.)         Marilynne Drivers, MD  12/27/21 1245

## 2021-12-26 NOTE — ED Provider Notes (Signed)
The patient is a 66 year old woman who was turned over to me at the end of Dr. Maryclare Labrador shift.  She is status post a C3-C5 laminectomy and fusion but has findings concerning for something going on in her low back.  In discussion with Dr. Carney Living, the plan is to get an MRI with contrast of her entire spine.    At the end of my shift, the patient's MRIs are not read.  The preliminary reads are not in either.  I will turn the patient over to the oncoming day doc.       Vista Lawman, MD  12/31/21 Earle Gell

## 2021-12-26 NOTE — ED Provider Notes (Signed)
Patient was signed out to me by the previous physician.  She presented with weakness and inability to stand.  She has recent surgery on her neck.  Overnight she had imaging of her lumbar thoracic and cervical spine.  This all showed normal postsurgical changes and no signs of cord compression.  She was evaluated by Dr. Carney Living from orthospine.  He did not believe there was any issue with her spinal surgery.  She did receive a dose of Ativan.  She was signed out to me awaiting reevaluation for her right arm weakness as reported by the daughter.  She also reported tingling in her right hand with a blood pressure cuff going off.    On my evaluation of the patient she had no right arm weakness or numbness.  NIH stroke scale is 0.    Physical Exam:  Constitutional: Well nourished, well developed, appears stated age. Alert and oriented.  HEENT: Neck supple without meningismus. PERRLA, no conjunctival injection. EOM intact.  Visual fields intact  Cardiovascular: RRR, Warm, well-perfused extremities  Respiratory: CTAB, Unlabored respiratory effort  Abdominal: Soft, nontender, nondistended, no CVA tenderness  Musculoskeletal: Full range of motion all extremities. No deformities. No peripheral edema.  Skin: Warm, dry. No rashes  Vascular: Pulses equal in all 4 extremities.  Neuro: CNs II-XII grossly intact. Sensation and motor function of extremities grossly intact.  No aphasia, dysarthria or loss coordination.  Psych: Appropriate mood and affect.    1145.  Spoke to Dr. Michell Heinrich from hospitalist service accepted admission.    Core Measures:  For Hospitalized Patients:    Hospitalization Decision Time:  The decision to hospitalize the patient was made by Kerney Elbe, MD @ at  1200 on 12/27/2021    Patient is being admitted to the hospital by Dr. Michell Heinrich. The results of their tests and reasons for their admission have been discussed with them and/or available family. They convey agreement and understanding for the need to be  admitted and for their admission diagnosis.      CONDITIONS ON ADMISSION:  Sepsis is not present at the time of admission. Deep Vein Thrombosis is not present at the time of admission. Thrombosis is not present at the time of admission. Urinary Tract Infection is not present at the time of admission. Pneumonia is not present at the time of admission. MRSA is not present at the time of admission. Wound infection is not present at the time of admission. Pressure Ulcer is  present at the time of admission.      CLINICAL IMPRESSION:  1. Fall, initial encounter    2. Weakness    PLAN:  Admit to Medical floor       Teodoro Kil, MD  12/29/21 1455

## 2021-12-26 NOTE — ED Triage Notes (Signed)
Pt arrives via ems fro c/o weakness after a fall. Pt was getting out her car when she was unable to stand and slowly fell to the ground. Pt states her legs buckled and that her R leg has become increasingly weak recently. Denies pain from trauma. Pt c/o pain to left lateral side.   A&Ox4. NAD noted.

## 2021-12-27 ENCOUNTER — Emergency Department: Admit: 2021-12-27 | Payer: PRIVATE HEALTH INSURANCE | Primary: Physician Assistant

## 2021-12-27 ENCOUNTER — Inpatient Hospital Stay: Admit: 2021-12-27 | Payer: PRIVATE HEALTH INSURANCE | Primary: Physician Assistant

## 2021-12-27 ENCOUNTER — Encounter: Admit: 2021-12-27 | Discharge: 2021-12-27 | Payer: PRIVATE HEALTH INSURANCE | Primary: Physician Assistant

## 2021-12-27 LAB — EKG 12-LEAD
Atrial Rate: 115 {beats}/min
P Axis: 74 degrees
P-R Interval: 150 ms
Q-T Interval: 324 ms
QRS Duration: 88 ms
QTc Calculation (Bazett): 448 ms
R Axis: -18 degrees
T Axis: 69 degrees
Ventricular Rate: 115 {beats}/min

## 2021-12-27 LAB — CBC WITH AUTO DIFFERENTIAL
Basophils %: 0 % (ref 0–2)
Basophils %: 0 % (ref 0–2)
Basophils Absolute: 0 10*3/uL (ref 0.0–0.1)
Basophils Absolute: 0 10*3/uL (ref 0.0–0.1)
Eosinophils %: 1 % (ref 0–5)
Eosinophils %: 2 % (ref 0–5)
Eosinophils Absolute: 0.1 10*3/uL (ref 0.0–0.4)
Eosinophils Absolute: 0.1 10*3/uL (ref 0.0–0.4)
Hematocrit: 31.7 % — ABNORMAL LOW (ref 35.0–45.0)
Hematocrit: 28.7 % — ABNORMAL LOW (ref 35.0–45.0)
Hemoglobin: 10 g/dL — ABNORMAL LOW (ref 12.0–16.0)
Hemoglobin: 9 g/dL — ABNORMAL LOW (ref 12.0–16.0)
Immature Granulocytes %: 1 % — ABNORMAL HIGH (ref 0.0–0.5)
Immature Granulocytes %: 0 % (ref 0.0–0.5)
Immature Granulocytes Absolute: 0.1 10*3/uL — ABNORMAL HIGH (ref 0.00–0.04)
Immature Granulocytes Absolute: 0 10*3/uL (ref 0.00–0.04)
Lymphocytes %: 4 % — ABNORMAL LOW (ref 21–52)
Lymphocytes %: 7 % — ABNORMAL LOW (ref 21–52)
Lymphocytes Absolute: 0.4 10*3/uL — ABNORMAL LOW (ref 0.9–3.6)
Lymphocytes Absolute: 0.5 10*3/uL — ABNORMAL LOW (ref 0.9–3.6)
MCH: 25.1 pg (ref 24.0–34.0)
MCH: 24.7 pg (ref 24.0–34.0)
MCHC: 31.5 g/dL (ref 31.0–37.0)
MCHC: 31.4 g/dL (ref 31.0–37.0)
MCV: 79.4 FL (ref 78.0–100.0)
MCV: 78.6 FL (ref 78.0–100.0)
MPV: 9.4 FL (ref 9.2–11.8)
MPV: 9.3 FL (ref 9.2–11.8)
Monocytes %: 10 % (ref 3–10)
Monocytes %: 7 % (ref 3–10)
Monocytes Absolute: 1.1 10*3/uL (ref 0.05–1.2)
Monocytes Absolute: 0.5 10*3/uL (ref 0.05–1.2)
Neutrophils %: 84 % — ABNORMAL HIGH (ref 40–73)
Neutrophils %: 84 % — ABNORMAL HIGH (ref 40–73)
Neutrophils Absolute: 9.1 10*3/uL — ABNORMAL HIGH (ref 1.8–8.0)
Neutrophils Absolute: 6.3 10*3/uL (ref 1.8–8.0)
Nucleated RBCs: 0 /100{WBCs}
Nucleated RBCs: 0 /100{WBCs}
Platelets: 368 10*3/uL (ref 135–420)
Platelets: 328 10*3/uL (ref 135–420)
RBC: 3.99 M/uL — ABNORMAL LOW (ref 4.20–5.30)
RBC: 3.65 M/uL — ABNORMAL LOW (ref 4.20–5.30)
RDW: 15 % — ABNORMAL HIGH (ref 11.6–14.5)
RDW: 15.3 % — ABNORMAL HIGH (ref 11.6–14.5)
WBC: 10.9 10*3/uL (ref 4.6–13.2)
WBC: 7.5 10*3/uL (ref 4.6–13.2)
nRBC: 0 10*3/uL (ref 0.00–0.01)
nRBC: 0 10*3/uL (ref 0.00–0.01)

## 2021-12-27 LAB — BASIC METABOLIC PANEL
Anion Gap: 4 mmol/L (ref 3.0–18)
Anion Gap: 3 mmol/L (ref 3.0–18)
BUN/Creatinine Ratio: 20 (ref 12–20)
BUN/Creatinine Ratio: 20 (ref 12–20)
BUN: 20 mg/dL — ABNORMAL HIGH (ref 7.0–18)
BUN: 15 mg/dL (ref 7.0–18)
CO2: 26 mmol/L (ref 21–32)
CO2: 27 mmol/L (ref 21–32)
Calcium: 10.5 mg/dL — ABNORMAL HIGH (ref 8.5–10.1)
Calcium: 10.6 mg/dL — ABNORMAL HIGH (ref 8.5–10.1)
Chloride: 111 mmol/L (ref 100–111)
Chloride: 115 mmol/L — ABNORMAL HIGH (ref 100–111)
Creatinine: 1 mg/dL (ref 0.6–1.3)
Creatinine: 0.76 mg/dL (ref 0.6–1.3)
Est, Glom Filt Rate: >60 mL/min/{1.73_m2} (ref >60)
Est, Glom Filt Rate: >60 mL/min/{1.73_m2} (ref >60)
Glucose: 398 mg/dL — ABNORMAL HIGH (ref 74–99)
Glucose: 180 mg/dL — ABNORMAL HIGH (ref 74–99)
Potassium: 3.8 mmol/L (ref 3.5–5.5)
Potassium: 3.4 mmol/L — ABNORMAL LOW (ref 3.5–5.5)
Sodium: 141 mmol/L (ref 136–145)
Sodium: 145 mmol/L (ref 136–145)

## 2021-12-27 LAB — COMPREHENSIVE METABOLIC PANEL
ALT: 34 U/L (ref 13–56)
AST: 60 U/L — ABNORMAL HIGH (ref 10–38)
Albumin/Globulin Ratio: 0.7 — ABNORMAL LOW (ref 0.8–1.7)
Albumin: 2.7 g/dL — ABNORMAL LOW (ref 3.4–5.0)
Alk Phosphatase: 121 U/L — ABNORMAL HIGH (ref 45–117)
Anion Gap: 5 mmol/L (ref 3.0–18)
BUN/Creatinine Ratio: 21 — ABNORMAL HIGH (ref 12–20)
BUN: 21 mg/dL — ABNORMAL HIGH (ref 7.0–18)
CO2: 26 mmol/L (ref 21–32)
Calcium: 10.7 mg/dL — ABNORMAL HIGH (ref 8.5–10.1)
Chloride: 106 mmol/L (ref 100–111)
Creatinine: 1 mg/dL (ref 0.6–1.3)
Est, Glom Filt Rate: >60 mL/min/{1.73_m2} (ref >60)
Globulin: 3.7 g/dL (ref 2.0–4.0)
Glucose: 385 mg/dL — ABNORMAL HIGH (ref 74–99)
Potassium: 6.2 mmol/L (ref 3.5–5.5)
Sodium: 137 mmol/L (ref 136–145)
Total Bilirubin: 0.5 mg/dL (ref 0.2–1.0)
Total Protein: 6.4 g/dL (ref 6.4–8.2)

## 2021-12-27 LAB — POCT GLUCOSE
POC Glucose: 185 mg/dL — ABNORMAL HIGH (ref 70–110)
POC Glucose: 192 mg/dL — ABNORMAL HIGH (ref 70–110)
POC Glucose: 211 mg/dL — ABNORMAL HIGH (ref 70–110)

## 2021-12-27 LAB — TROPONIN: Troponin, High Sensitivity: 11 ng/L (ref 0–54)

## 2021-12-27 LAB — SEDIMENTATION RATE: Sed Rate, Automated: 66 mm/h — ABNORMAL HIGH (ref 0–30)

## 2021-12-27 LAB — C-REACTIVE PROTEIN: CRP: 9.6 mg/dL — ABNORMAL HIGH (ref 0–0.3)

## 2021-12-27 LAB — HEMOGLOBIN A1C
Estimated Avg Glucose: 171 mg/dL
Hemoglobin A1C: 7.6 % — ABNORMAL HIGH (ref 4.2–5.6)

## 2021-12-27 MED ORDER — SODIUM CHLORIDE 0.9 % IV SOLN (MINI-BAG)
0.9 % | Freq: Once | INTRAVENOUS | Status: AC
Start: 2021-12-27 — End: 2021-12-27
  Administered 2021-12-27: 4500 mg via INTRAVENOUS

## 2021-12-27 MED ORDER — INSULIN LISPRO 100 UNIT/ML IJ SOLN
100 UNIT/ML | Freq: Every evening | INTRAMUSCULAR | Status: AC
Start: 2021-12-27 — End: 2022-01-06
  Administered 2021-12-29 – 2021-12-30 (×2): 4 [IU] via SUBCUTANEOUS

## 2021-12-27 MED ORDER — INSULIN LISPRO 100 UNIT/ML IJ SOLN
100 UNIT/ML | Freq: Three times a day (TID) | INTRAMUSCULAR | Status: AC
Start: 2021-12-27 — End: 2022-01-06
  Administered 2021-12-28 (×2): 2 [IU] via SUBCUTANEOUS
  Administered 2021-12-28: 13:00:00 4 [IU] via SUBCUTANEOUS
  Administered 2021-12-29: 20:00:00 8 [IU] via SUBCUTANEOUS
  Administered 2021-12-29 – 2021-12-30 (×2): 4 [IU] via SUBCUTANEOUS
  Administered 2021-12-30 – 2021-12-31 (×3): 8 [IU] via SUBCUTANEOUS
  Administered 2021-12-31: 16:00:00 6 [IU] via SUBCUTANEOUS
  Administered 2021-12-31: 21:00:00 4 [IU] via SUBCUTANEOUS
  Administered 2022-01-01 – 2022-01-02 (×2): 2 [IU] via SUBCUTANEOUS
  Administered 2022-01-02 – 2022-01-03 (×2): 4 [IU] via SUBCUTANEOUS
  Administered 2022-01-03: 22:00:00 8 [IU] via SUBCUTANEOUS

## 2021-12-27 MED ORDER — POTASSIUM CHLORIDE 10 MEQ/100ML IV SOLN
10 MEQ/0ML | INTRAVENOUS | Status: AC
Start: 2021-12-27 — End: 2021-12-27

## 2021-12-27 MED ORDER — MAGNESIUM SULFATE 2000 MG/50 ML IVPB PREMIX
2 GM/50ML | INTRAVENOUS | Status: AC | PRN
Start: 2021-12-27 — End: 2022-01-06

## 2021-12-27 MED ORDER — LACTATED RINGERS IV SOLN
INTRAVENOUS | Status: AC
Start: 2021-12-27 — End: 2021-12-28
  Administered 2021-12-27 – 2021-12-28 (×2): via INTRAVENOUS

## 2021-12-27 MED ORDER — GLUCOSE 4 G PO CHEW
4 g | ORAL | Status: AC | PRN
Start: 2021-12-27 — End: 2022-01-06

## 2021-12-27 MED ORDER — SODIUM CHLORIDE 0.9 % IV SOLN (MINI-BAG)
0.9 % | Freq: Three times a day (TID) | INTRAVENOUS | Status: AC
Start: 2021-12-27 — End: 2022-01-06
  Administered 2021-12-28 – 2022-01-06 (×28): 3375 mg via INTRAVENOUS

## 2021-12-27 MED ORDER — DAKINS (1/4 STRENGTH) 0.125 % EX SOLN
0.125 % | Freq: Two times a day (BID) | CUTANEOUS | Status: AC
Start: 2021-12-27 — End: 2021-12-31
  Administered 2021-12-28 – 2021-12-30 (×2)

## 2021-12-27 MED ORDER — NORMAL SALINE FLUSH 0.9 % IV SOLN
0.9 % | INTRAVENOUS | Status: AC | PRN
Start: 2021-12-27 — End: 2022-01-06

## 2021-12-27 MED ORDER — DEXTROSE 10 % IV BOLUS
INTRAVENOUS | Status: AC | PRN
Start: 2021-12-27 — End: 2022-01-06

## 2021-12-27 MED ORDER — NORMAL SALINE FLUSH 0.9 % IV SOLN
0.9 % | Freq: Two times a day (BID) | INTRAVENOUS | Status: AC
Start: 2021-12-27 — End: 2022-01-06
  Administered 2021-12-28 – 2022-01-02 (×10): 10 mL via INTRAVENOUS
  Administered 2022-01-02 – 2022-01-03 (×2): 5 mL via INTRAVENOUS
  Administered 2022-01-03: 14:00:00 10 mL via INTRAVENOUS
  Administered 2022-01-04: 01:00:00 5 mL via INTRAVENOUS
  Administered 2022-01-04 – 2022-01-06 (×4): 10 mL via INTRAVENOUS

## 2021-12-27 MED ORDER — LACTATED RINGERS IV BOLUS
Freq: Once | INTRAVENOUS | Status: AC
Start: 2021-12-27 — End: 2021-12-27
  Administered 2021-12-27: 03:00:00 1000 mL via INTRAVENOUS

## 2021-12-27 MED ORDER — POTASSIUM CHLORIDE 10 MEQ/100ML IV SOLN
10 MEQ/0ML | INTRAVENOUS | Status: AC | PRN
Start: 2021-12-27 — End: 2022-01-06
  Administered 2021-12-28 (×2): 10 meq via INTRAVENOUS

## 2021-12-27 MED ORDER — MORPHINE SULFATE (PF) 10 MG/ML IJ SOLN
10 MG/ML | Freq: Once | INTRAMUSCULAR | Status: DC
Start: 2021-12-27 — End: 2021-12-27

## 2021-12-27 MED ORDER — SODIUM CHLORIDE 0.9 % IV SOLN
0.9 % | INTRAVENOUS | Status: AC | PRN
Start: 2021-12-27 — End: 2022-01-06
  Administered 2021-12-28: 14:00:00 via INTRAVENOUS

## 2021-12-27 MED ORDER — POLYETHYLENE GLYCOL 3350 17 G PO PACK
17 g | Freq: Every day | ORAL | Status: AC | PRN
Start: 2021-12-27 — End: 2022-01-06
  Administered 2022-01-06: 01:00:00 17 g via ORAL

## 2021-12-27 MED ORDER — GLUCAGON HCL RDNA (DIAGNOSTIC) 1 MG IJ SOLR
1 MG | INTRAMUSCULAR | Status: AC | PRN
Start: 2021-12-27 — End: 2022-01-06

## 2021-12-27 MED ORDER — ONDANSETRON 4 MG PO TBDP
4 MG | Freq: Three times a day (TID) | ORAL | Status: AC | PRN
Start: 2021-12-27 — End: 2022-01-06

## 2021-12-27 MED ORDER — DEXTROSE 10 % IV SOLN
10 % | INTRAVENOUS | Status: AC | PRN
Start: 2021-12-27 — End: 2022-01-06

## 2021-12-27 MED ORDER — ONDANSETRON HCL 4 MG/2ML IJ SOLN
4 MG/2ML | Freq: Four times a day (QID) | INTRAMUSCULAR | Status: AC | PRN
Start: 2021-12-27 — End: 2022-01-06

## 2021-12-27 MED ORDER — INSULIN LISPRO 100 UNIT/ML IJ SOLN
100 UNIT/ML | INTRAMUSCULAR | Status: AC
Start: 2021-12-27 — End: 2021-12-26
  Administered 2021-12-27: 03:00:00 10 [IU] via SUBCUTANEOUS

## 2021-12-27 MED ORDER — ACETAMINOPHEN 650 MG RE SUPP
650 | Freq: Four times a day (QID) | RECTAL | Status: DC | PRN
Start: 2021-12-27 — End: 2022-01-06

## 2021-12-27 MED ORDER — ACETAMINOPHEN 325 MG PO TABS
325 MG | ORAL | Status: AC
Start: 2021-12-27 — End: 2021-12-27
  Administered 2021-12-27: 05:00:00 650 mg via ORAL

## 2021-12-27 MED ORDER — ACETAMINOPHEN 325 MG PO TABS
325 | Freq: Four times a day (QID) | ORAL | Status: DC | PRN
Start: 2021-12-27 — End: 2021-12-27

## 2021-12-27 MED ORDER — LORAZEPAM 2 MG/ML IJ SOLN
2 MG/ML | Freq: Once | INTRAMUSCULAR | Status: AC
Start: 2021-12-27 — End: 2021-12-27
  Administered 2021-12-27: 07:00:00 1 mg via INTRAVENOUS

## 2021-12-27 MED FILL — PIPERACILLIN SOD-TAZOBACTAM SO 4.5 (4-0.5) G IV SOLR: 4.5 (4-0.5) g | INTRAVENOUS | Qty: 4500

## 2021-12-27 MED FILL — BD POSIFLUSH 0.9 % IV SOLN: 0.9 % | INTRAVENOUS | Qty: 40

## 2021-12-27 MED FILL — HUMALOG 100 UNIT/ML IJ SOLN: 100 UNIT/ML | INTRAMUSCULAR | Qty: 1

## 2021-12-27 MED FILL — H-CHLOR 12 0.125 % EX SOLN: 0.125 % | CUTANEOUS | Qty: 473

## 2021-12-27 MED FILL — ACETAMINOPHEN 325 MG PO TABS: 325 MG | ORAL | Qty: 2

## 2021-12-27 MED FILL — LACTATED RINGERS IV SOLN: INTRAVENOUS | Qty: 1000

## 2021-12-27 MED FILL — LORAZEPAM 2 MG/ML IJ SOLN: 2 MG/ML | INTRAMUSCULAR | Qty: 1

## 2021-12-27 NOTE — ED Notes (Signed)
Pt laying on stretcher, family at bedside     Lelan Pons, RN  12/27/21 [phone]

## 2021-12-27 NOTE — Progress Notes (Signed)
Pharmacy Note     Pharmacy was consulted to dose Zosyn for this diabetic patient with an unstageable sacral wound.    Estimated Creatinine Clearance: Estimated Creatinine Clearance: 66 mL/min (based on SCr of 0.76 mg/dL).    BMI:  Body mass index is 28.5 kg/m.    Zosyn 4.5gm IVPB loading dose over 30 min followed by Zosyn 3.375 gm IVPB Q8h, extended infusion dosing.    Pharmacy will continue to monitor and adjust dose as necessary.      Please call with any questions.    Thank you,  Altamese Dilling, [facility]

## 2021-12-27 NOTE — ED Notes (Signed)
PATIENT MEDICATED AS PER MAR. PATIENT INFORMED OF EFFECTS OF MEDICATION. RELATIVE PRESENT AND UPDATE.      Pam Drown, RN  12/27/21 1954

## 2021-12-27 NOTE — ED Notes (Signed)
PATIENT LEFT FOR ULTRASOUND ACCOMPANIED BY STAFF     Arletha Pili, RN  12/27/21 1919

## 2021-12-27 NOTE — ED Notes (Signed)
Pt returned from [place], RN  12/27/21 [phone]

## 2021-12-27 NOTE — Other (Signed)
12/27/21 2116   Vital Signs   Temp (!) 101.8 F (38.8 C)  (RN cassidy is aware of tem amd PR)   Temp Source Oral   Pulse (!) 120   Heart Rate Source Monitor   Respirations 16   BP 139/61   MAP (Calculated) 87   BP Location Right upper arm   BP Method Automatic   Level of Consciousness 0   MEWS Score 5   Oxygen Therapy   SpO2 99 %       MEWS 5 r/t elevated temp and HR. Dr. Michell Heinrich on unit and verbally notified. He stated he would order PRN tylenol and vanco.     Tylenol given and IV abx administered.

## 2021-12-27 NOTE — ED Notes (Signed)
Pt transported to CT scan with transport team.      Pam Drown, RN  12/27/21 1501

## 2021-12-27 NOTE — ED Notes (Signed)
Orders noted for admission, family member agreeable to [place].      Pam Drown, RN  12/27/21 1139

## 2021-12-27 NOTE — ED Notes (Signed)
Assumed care of pt from hallway. Pt awaiting MRI results and dispo. Pt currently resting comfortable on stretcher, placed on monitor.      Pam Drown, RN  12/27/21 [phone]

## 2021-12-27 NOTE — ED Notes (Signed)
Attempted to ambulate pt. Pt was max assisted to even get out of bed, then she almost fell onto floor because there was no self control. RN's stopped her from falling. Pt and family asked several times "what do you do at home?"  Family reports she walks at home. Pt unable to position her legs under her in order to stand. RN did not feel pt was safe to keep moving. Placed back in stretcher, MD aware.      Judeth Porch, RN  12/27/21 1046

## 2021-12-27 NOTE — ED Notes (Signed)
PATIENT OBSERVED ASLEEP IN BED, RELATIVE PRESENT. PATIENT AWAITS TO BE SEEN BY PT     Arletha Pili, RN  12/27/21 1129

## 2021-12-27 NOTE — H&P (Addendum)
History & Physical    Patient: [name] Short MRN: [id number]  CSN: [id number]    Date of Birth: [birth year]  Age: 66 y.o.  Sex: female      DOA: 12/26/2021    Chief Complaint:   Chief Complaint   Patient presents with    Fall          HPI:     [name] Short is a 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 The history was obtained from the patient's daughter. The daughter notes the patient couldn't stand at home and had RUQ pain and she was unable to describe. RUQ pain started yesterday and family states no known history of this. The daughter notes the pain resolved here. The patient was not able to stand up and has had a recent procedure(cervical decompression and fusion 2 weeks ago) and was evaluated for these symptoms by her spine surgeon Dr. Ma Rings, he does not think her issues are related to her recent surgery per his note from today. The daughter notes the patient has been getting progressively weaker over months. Sh went from a cane to a walker in 6 months. The daughter notes the sacral ulcer is actually only a week or 2 old and has progressed quickly. No nausea or vomiting, no diarrhea or constipation per the patient's daughter    In the ED it was noted the patient had Slight tachycardia at 103 but otherwise normal vitals except for slightly lower BP at 104/56. Labs were remarkable  a potassium of 3.4, chloride of 115, glucose of 398, calcium of 10.6, a hemoglobin of 9.0. The patient was given tylenol, 10 units insulin, 1 liter LR and ativan 1 mg. MRI C/T/L spine were obtained. 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 Ma Rings evaluated the MRI of those aforementioned regions and did not think any findings were contributing to her symptoms at this time.     Past Medical History:   Diagnosis Date    Anemia     Arthritis     Chronic  pain     Legs and Shoulders    Diabetes ([place])     Exposure to asbestos     History of blood transfusion     History of colon polyps     HTN (hypertension)     Hx of blood clots     DVT    Hyperlipemia     Menopause     Pulmonary nodule     Serum calcium elevated     Sleep apnea     not using cpap    Stroke [facility])        Past Surgical History:   Procedure Laterality Date    CESAREAN SECTION  1987    COLONOSCOPY N/A 01/13/2017    COLONOSCOPY performed by Joycelyn Schmid, MD at [place] N/A 12/09/2021    CERVICAL THREE/FOUR/FIVE/SIX LAMINECTOMY FUSION; C-ARM; STRYKER; EXT BONE GROWTH STIM; 23 HR performed by Candi Leash, MD at [facility] MAIN OR       Family History   Problem Relation Age of Onset    Diabetes Other 88        parent, NOS  Cancer Father     Hypertension Other 66        parent,NOS    Heart Disease Other 21        parent, NOS    Lung Disease Father        Social History     Socioeconomic History    Marital status: Single   Tobacco Use    Smoking status: Every Day     Packs/day: 1.00     Types: Cigarettes, Cigars     Last attempt to quit: 08/2019     Years since quitting: 2.3    Smokeless tobacco: Never   Vaping Use    Vaping Use: Never used   Substance and Sexual Activity    Alcohol use: Yes     Comment: occ    Drug use: Never       Prior to Admission medications    Medication Sig Start Date End Date Taking? Authorizing Provider   Aspirin (VAZALORE) 81 MG CAPS Take 1 capsule by mouth    Historical Provider, MD   lisinopril (PRINIVIL;ZESTRIL) 40 MG tablet Take 1 tablet by mouth daily    Historical Provider, MD   acetaminophen (TYLENOL) 500 MG tablet Take 1 tablet by mouth every 4 hours as needed for Fever or Pain    Historical Provider, MD   polyethylene glycol (GLYCOLAX) 17 g packet Take 17 g by mouth 2 times daily.    Historical Provider, MD   ascorbic acid (VITAMIN C) 250 MG tablet Take 1 tablet by mouth daily 12/21/16   Historical  Provider, MD   clopidogrel (PLAVIX) 75 MG tablet Take 1 tablet by mouth daily 09/16/21   Historical Provider, MD   Continuous Blood Gluc Sensor (FREESTYLE LIBRE 3 SENSOR) MISC MISCELLANEOUS CHANGE NEW SENSOR EVERY 14 DAYS  Patient not taking: Reported on 11/06/2021 08/01/21   Historical Provider, MD   cyclobenzaprine (FLEXERIL) 5 MG tablet Take 5 mg by mouth daily as needed    Historical Provider, MD   Jimmy Footman BCISE 2 MG/0.85ML injection Inject 2 mg into the skin every 7 days every Sunday 09/07/21   Historical Provider, MD   ferrous sulfate (IRON 325) 325 (65 Fe) MG tablet Take 1 tablet by mouth daily 12/21/16   Historical Provider, MD   furosemide (LASIX) 40 MG tablet furosemide 40 mg tablet   TAKE 1 TABLET BY MOUTH EVERY DAY 09/23/21   Historical Provider, MD   gabapentin (NEURONTIN) 100 MG capsule Take 1 capsule by mouth as needed. 09/23/21   Historical Provider, MD   Glucagon, rDNA, (GLUCAGON EMERGENCY) 1 MG KIT INJECT 1 MILLIGRAM(S) INJECTION EVERY DAY AS NEEDED FOR LOW BLOOD DUGAR 08/18/21   Historical Provider, MD   HUMALOG MIX 75/25 KWIKPEN (75-25) 100 UNIT/ML SUPN injection pen Inject 0.17 mLs into the skin 2 times daily (with meals) 08/11/21   Historical Provider, MD   pantoprazole (PROTONIX) 40 MG tablet Take 40 mg by mouth daily as needed (acid reflux, heartburn) 12/21/16   Historical Provider, MD       Allergies   Allergen Reactions    Lactose Other (See Comments)     Intolerance-Pt denies         Review of Systems  GENERAL: Patient alert, awake and oriented times 3, able to communicate full sentences and not in distress. no weight loss, no falls.  HEENT: No change in vision, no earache, no tinnitus, no sore throat or sinus congestion.   NECK: No pain or stiffness.   PULMONARY:  No shortness of breath, no cough or wheeze.   Cardiovascular: no pnd or orthopnea, no CP  GASTROINTESTINAL: No abdominal pain, no nausea, no vomiting or diarrhea, no melena or bright red blood per rectum.   GENITOURINARY: No urinary  frequency, no urgency, no hesitancy or dysuria.   MUSCULOSKELETAL: No joint or muscle pain, no back pain, no recent trauma.   DERMATOLOGIC: No rash, no itching, no lesions.   ENDOCRINE: No polyuria, no polydipsia, no heat or cold intolerance. No recent change in weight.   HEMATOLOGICAL: No anemia or easy bruising or bleeding.   NEUROLOGIC: No headache, no seizures, no numbness, no tingling or weakness.       Physical Exam:     Physical Exam:  BP (!) 115/52   Pulse (!) 103   Temp 99.6 F (37.6 C) (Oral)   Resp 15   SpO2 100%         Temp (24hrs), Avg:99.8 F (37.7 C), Min:99.4 F (37.4 C), Max:100.3 F (37.9 C)    No intake/output data recorded.   No intake/output data recorded.    General:  Alert, cooperative, no distress, appears stated age.              Head: Normocephalic, without obvious abnormality, atraumatic.   Eyes:  Conjunctivae/corneas clear. PERRL, EOMs intact.   Nose: Nares normal. No drainage or sinus tenderness.   Neck: Supple, symmetrical, trachea midline, no adenopathy, thyroid: no enlargement, no carotid bruit and no JVD.   Lungs:   Clear to auscultation bilaterally.   Heart:  Regular rate and rhythm, S1, S2 normal. Tachycardia     Abdomen: Soft, non-tender. Bowel sounds normal.    Extremities: Extremities normal, atraumatic, no cyanosis or edema.   Pulses: 2+ and symmetric all extremities.   Skin:  No rashes or lesions.Unstagable sacral decubitus, moderately tender, foul odor noted, no periwound erythema, no crepitus    Neurologic: AAOx2,lethargic. No focal motor or sensory deficit.       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  Patient not safe at home due to inability to stand  Differential is broad for weakness: could consider deconditioning, progressive DDD leading to weakness and debility, prior CVA leading to deconditioning  Wound care consult   Surgery consulted for sacral wound  Patient on IV zosyn for sacral wound  Update** patient became febrile and tachycardic, will add vancomycin.  CRP/sed rate elevated on 5/25  CT scan sacral area, patient needed ativan for MRI and was very lethargic afterward  PT/OT consult, patient will likely need placement  Regarding diabetes will put patient on Insulin SSI, POCT glucose checks  PTH level    DVT/GI Prophylaxis: Lovenox    Discussed with patient and husband at bedside about hospital admission  and my plan care, both understood and agree with my plan care.    Ferd Hibbs, DO  12/27/2021 1:51 PM    Disclaimer: Sections of this note are dictated using utilizing voice recognition software. Minor typographical errors may be present. If questions arise, please do not hesitate to contact me or call our department.

## 2021-12-27 NOTE — Home Health (Signed)
Pts dtr reports pt had a fall and now is in the ER at [place] of injuries at this time per pts dtr.

## 2021-12-27 NOTE — ED Notes (Signed)
Pt found to be incontinent of stool and urine. Pt cleaned, lg unstageable ulcer noted to sacrum. Pt's family reports present on admission.      Pam Drown, RN  12/27/21 [phone]

## 2021-12-27 NOTE — ED Notes (Signed)
Report called to floor, transport arranged.      Pam Drown, RN  12/27/21 2007

## 2021-12-27 NOTE — Consults (Addendum)
Room ED07: focused wound assess & discussed with Dr. Michell Heinrich.   Noted photo in chart from [facility] 12/16/[facility]21 & wound on sacrum looks worse today.     Sacrum, unstageable pressure injury, 9.3x7.5x0.3cm, foul odor, center black moist necrosis,  Rest of wound bed is yellow & pink, periwound tissue is hyperpigmented proximally  & distally, POA.  Right buttock, stage 2 pressure injury, 4.3x2.7x0.1cm, pink base proximally,  Rest of wound bed is hyperpigmented, dry base, POA.   Both wounds exacerbated by incontinence bowel/bladder.       Right lateral heel, unstageable pressure injury, 3.1x3.7x0cm, dry black tissue,  No drainage, no break in skin, periwound hyperpigmented & tender to touch, POA.      Left lateral heel, unstageable pressure injury, 4.5x6x0cm, dry black tissue,  No drainage, no break in skin, periwound hyperpigmented & tender to touch, POA.  Pt also has hyperpigmentation & dermatology moles in both groins, POA.   Applied silver hydrofiber & silicone dressing to sacrum, pt in process of going off the unit   for testing, will add orders to chart for unit nursing staff to continue.  Unit nursing staff to apply heel prevention boots, use while in bed. Velcro should be on the loosest section.  Lift team consult: turn & reposition q2hrs, left & right sides, on back only for meals or treatments.  Unit nursing staff to turn & reposition q2hrs, left & right sides, on back only for meals or treatments.  Wound care orders sacrum: unit nursing staff to apply Dakin's solution wet to dry dressings BID & prn soilage.   Leave heels open to air, socks are fine, boots are fine.   Will turn over care to nursing staff at this time.  Rejina Odle Fredric Mare BSN, RN, [employer], [facility]

## 2021-12-27 NOTE — ED Notes (Signed)
Pt provided with dinner, pt will be NPO after MD.Pt noted with rm [phone] attempted to call report.     Pam Drown, RN  12/27/21 [phone]

## 2021-12-27 NOTE — Consults (Signed)
History & Physical    Date: 12/27/2021    Referring Physician: Hospitalist    Assessment:  Full code      Principal Problem:    Fall at home, initial encounter  Active Problems:    Failure to thrive in adult  Resolved Problems:    * No resolved hospital problems. *  Unstagable sacral wound     Plan:   I have discussed the above findings with [name] and her daughter who is bedside my recommendation to perform debridement of the sacral decubitus. It is unclear how far this extends but she is moderately tender and I would recommend this be done under sedation. NPO after midnight. Hold anticoagulants today. We will obtain wound cultures in the operating room. The risks and benefits of the procedure were reviewed with the patient including infection, bleeding, need for repeat procedure, injury to surrounding structures, pain and poor cosmetic outcome.     History of Present Illness:  [name] is a 66 y.o. year old female who presented with failure to thrive. She was seen with her daughter who was bedside. She lives with her daughter. Approximately 2 weeks ago she underwent cervical spine surgery and since then she developed a sacral wound. She reports this area is tender. Over the last several months they report she has been declining and went from cane to walker. Now little activity outside of bed. No fevers or chills.     History:  Past Medical History:   Diagnosis Date    Anemia     Arthritis     Chronic pain     Legs and Shoulders    Diabetes (HCC)     Exposure to asbestos     History of blood transfusion     History of colon polyps     HTN (hypertension)     Hx of blood clots     DVT    Hyperlipemia     Menopause     Pulmonary nodule     Serum calcium elevated     Sleep apnea     not using cpap    Stroke [facility])      Past Surgical History:   Procedure Laterality Date    CESAREAN SECTION  1987    COLONOSCOPY N/A 01/13/2017    COLONOSCOPY performed by Joycelyn Schmid, MD at [place] N/A 12/09/2021    CERVICAL THREE/FOUR/FIVE/SIX LAMINECTOMY FUSION; C-ARM; STRYKER; EXT BONE GROWTH STIM; 23 HR performed by Candi Leash, MD at [facility] MAIN OR     Family History   Problem Relation Age of Onset    Diabetes Other 20        parent, NOS    Cancer Father     Hypertension Other 35        parent,NOS    Heart Disease Other 24        parent, NOS    Lung Disease Father      Social History     Socioeconomic History    Marital status: Single     Spouse name: Not on file    Number of children: Not on file    Years of education: Not on file    Highest education level: Not on file   Occupational History    Not on file   Tobacco Use    Smoking status:  Every Day     Packs/day: 1.00     Types: Cigarettes, Cigars     Last attempt to quit: 08/2019     Years since quitting: 2.3    Smokeless tobacco: Never   Vaping Use    Vaping Use: Never used   Substance and Sexual Activity    Alcohol use: Yes     Comment: occ    Drug use: Never    Sexual activity: Not on file   Other Topics Concern    Not on file   Social History Narrative    Not on file     Social Determinants of Health     Financial Resource Strain: Not on file   Food Insecurity: Not on file   Transportation Needs: Not on file   Physical Activity: Not on file   Stress: Not on file   Social Connections: Not on file   Intimate Partner Violence: Not on file   Housing Stability: Not on file     Allergies   Allergen Reactions    Lactose Other (See Comments)     Intolerance-Pt denies       Medications:  No current facility-administered medications on file prior to encounter.     Current Outpatient Medications on File Prior to Encounter   Medication Sig Dispense Refill    lisinopril (PRINIVIL;ZESTRIL) 40 MG tablet Take 1 tablet by mouth daily      acetaminophen (TYLENOL) 500 MG tablet Take 1 tablet by mouth every 4 hours as needed for Fever or Pain      polyethylene glycol (GLYCOLAX) 17 g packet Take 17 g by mouth 2 times  daily      clopidogrel (PLAVIX) 75 MG tablet Take 1 tablet by mouth daily      Continuous Blood Gluc Sensor (FREESTYLE LIBRE 3 SENSOR) MISC       BYDUREON BCISE 2 MG/0.85ML injection Inject 0.85 mLs into the skin every 7 days every Sunday      furosemide (LASIX) 40 MG tablet furosemide 40 mg tablet   TAKE 1 TABLET BY MOUTH EVERY DAY      HUMALOG MIX 75/25 KWIKPEN (75-25) 100 UNIT/ML SUPN injection pen Inject 0.17 mLs into the skin 2 times daily (with meals)      Glucagon, rDNA, (GLUCAGON EMERGENCY) 1 MG KIT INJECT 1 MILLIGRAM(S) INJECTION EVERY DAY AS NEEDED FOR LOW BLOOD DUGAR         Review of Systems:  Review of Systems   Constitutional:  Positive for activity change, appetite change and fever.   Respiratory:  Negative for chest tightness and shortness of breath.    Musculoskeletal:  Positive for back pain.   Skin:  Positive for color change and wound.   Hematological:  Negative for adenopathy. Does not bruise/bleed easily.     Physical Exam:  Patient Vitals for the past 24 hrs:   BP Temp Temp src Pulse Resp SpO2 Weight   12/27/21 1630 -- -- -- -- -- 98 % --   12/27/21 1545 -- -- -- -- -- -- 141 lb 1.5 oz (64 kg)   12/27/21 1500 -- -- -- -- -- 98 % --   12/27/21 1400 -- -- -- -- -- 99 % --   12/27/21 1018 -- -- -- -- -- 100 % --   12/27/21 1015 (!) 115/52 -- -- -- -- -- --   12/27/21 1000 (!) 118/59 -- -- -- -- -- --   12/27/21 0945 (!) 115/54 -- -- -- -- -- --  12/27/21 0930 (!) 111/52 -- -- -- -- -- --   12/27/21 0915 (!) 116/52 -- -- -- -- -- --   12/27/21 0900 (!) 119/54 -- -- -- -- -- --   12/27/21 0845 (!) 128/56 -- -- -- -- -- --   12/27/21 0830 93/81 -- -- -- -- 100 % --   12/27/21 0511 (!) 104/56 99.6 F (37.6 C) Oral (!) 103 15 97 % --   12/27/21 0015 (!) 115/58 100.3 F (37.9 C) -- 99 14 99 % --   12/26/21 2230 114/65 99.4 F (37.4 C) Oral 95 16 100 % --       Physical Exam  Constitutional:       General: She is not in acute distress.     Appearance: She is ill-appearing.   HENT:      Head:  Normocephalic and atraumatic.   Eyes:      General: No scleral icterus.     Extraocular Movements: Extraocular movements intact.      Pupils: Pupils are equal, round, and reactive to light.   Cardiovascular:      Rate and Rhythm: Tachycardia present.   Pulmonary:      Effort: Pulmonary effort is normal.   Skin:     General: Skin is warm.      Comments: Unstagable sacral decubitus, moderately tender, foul odor noted, no periwound erythema, no crepitus   Neurological:      Mental Status: She is alert and oriented to person, place, and time.   Psychiatric:         Mood and Affect: Mood normal.       Laboratory Data:  Recent Results (from the past 2016 hour(s))   POCT Glucose    Collection Time: 12/09/21  9:16 AM   Result Value Ref Range    POC Glucose 204 (H) 70 - 110 mg/dL   POCT Glucose    Collection Time: 12/09/21 11:58 AM   Result Value Ref Range    POC Glucose 167 (H) 70 - 110 mg/dL   Hemoglobin A1c    Collection Time: 12/09/21  4:07 PM   Result Value Ref Range    Hemoglobin A1C 7.5 (H) 4.2 - 5.6 %    eAG 169 mg/dL   POCT Glucose    Collection Time: 12/09/21  4:20 PM   Result Value Ref Range    POC Glucose 239 (H) 70 - 110 mg/dL   POCT Glucose    Collection Time: 12/09/21  9:07 PM   Result Value Ref Range    POC Glucose 295 (H) 70 - 110 mg/dL   POCT Glucose    Collection Time: 12/10/21  6:09 AM   Result Value Ref Range    POC Glucose 357 (H) 70 - 110 mg/dL   Basic Metabolic Panel    Collection Time: 12/10/21  6:40 AM   Result Value Ref Range    Sodium 135 (L) 136 - 145 mmol/L    Potassium 5.3 3.5 - 5.5 mmol/L    Chloride 107 100 - 111 mmol/L    CO2 25 21 - 32 mmol/L    Anion Gap 3 3.0 - 18 mmol/L    Glucose 310 (H) 74 - 99 mg/dL    BUN 31 (H) 7.0 - 18 MG/DL    Creatinine 1.31 (H) 0.6 - 1.3 MG/DL    Bun/Cre Ratio 24 (H) 12 - 20      Est, Glom Filt Rate 46 (L) >60 ml/min/1.62m  Calcium 10.5 (H) 8.5 - 10.1 MG/DL   CBC    Collection Time: 12/10/21  6:40 AM   Result Value Ref Range    WBC 9.4 4.6 - 13.2 K/uL    RBC  4.03 (L) 4.20 - 5.30 M/uL    Hemoglobin 10.2 (L) 12.0 - 16.0 g/dL    Hematocrit 32.1 (L) 35.0 - 45.0 %    MCV 79.7 78.0 - 100.0 FL    MCH 25.3 24.0 - 34.0 PG    MCHC 31.8 31.0 - 37.0 g/dL    RDW 15.3 (H) 11.6 - 14.5 %    Platelets 195 135 - 420 K/uL    MPV 10.7 9.2 - 11.8 FL    Nucleated RBCs 0.0 0 PER 100 WBC    nRBC 0.00 0.00 - 0.01 K/uL   POCT Glucose    Collection Time: 12/10/21 12:01 PM   Result Value Ref Range    POC Glucose 365 (H) 70 - 110 mg/dL   POCT Glucose    Collection Time: 12/10/21  6:30 PM   Result Value Ref Range    POC Glucose 400 (H) 70 - 110 mg/dL   POCT Glucose    Collection Time: 12/10/21  8:44 PM   Result Value Ref Range    POC Glucose 364 (H) 70 - 110 mg/dL   POCT Glucose    Collection Time: 12/11/21  6:59 AM   Result Value Ref Range    POC Glucose 222 (H) 70 - 110 mg/dL   POCT Glucose    Collection Time: 12/11/21 10:47 AM   Result Value Ref Range    POC Glucose 328 (H) 70 - 110 mg/dL   EKG 12 Lead    Collection Time: 12/26/21  8:27 PM   Result Value Ref Range    Ventricular Rate 115 BPM    Atrial Rate 115 BPM    P-R Interval 150 ms    QRS Duration 88 ms    Q-T Interval 324 ms    QTc Calculation (Bazett) 448 ms    P Axis 74 degrees    R Axis -18 degrees    T Axis 69 degrees    Diagnosis       Sinus tachycardia  Otherwise normal ECG  When compared with ECG of 22-Apr-2018 08:27,  Vent. rate has increased BY  39 BPM  Non-specific change in ST segment in Lateral leads  Confirmed by Gerre Scull, MD, ----- ([id number]) on 12/27/2021 12:28:05 PM     CBC with Auto Differential    Collection Time: 12/26/21  8:38 PM   Result Value Ref Range    WBC 10.9 4.6 - 13.2 K/uL    RBC 3.99 (L) 4.20 - 5.30 M/uL    Hemoglobin 10.0 (L) 12.0 - 16.0 g/dL    Hematocrit 31.7 (L) 35.0 - 45.0 %    MCV 79.4 78.0 - 100.0 FL    MCH 25.1 24.0 - 34.0 PG    MCHC 31.5 31.0 - 37.0 g/dL    RDW 15.0 (H) 11.6 - 14.5 %    Platelets 368 135 - 420 K/uL    MPV 9.4 9.2 - 11.8 FL    Nucleated RBCs 0.0 0 PER 100 WBC    nRBC 0.00 0.00 - 0.01  K/uL    Neutrophils % 84 (H) 40 - 73 %    Lymphocytes % 4 (L) 21 - 52 %    Monocytes % 10 3 - 10 %    Eosinophils % 1  0 - 5 %    Basophils % 0 0 - 2 %    Immature Granulocytes 1 (H) 0.0 - 0.5 %    Neutrophils Absolute 9.1 (H) 1.8 - 8.0 K/UL    Lymphocytes Absolute 0.4 (L) 0.9 - 3.6 K/UL    Monocytes Absolute 1.1 0.05 - 1.2 K/UL    Eosinophils Absolute 0.1 0.0 - 0.4 K/UL    Basophils Absolute 0.0 0.0 - 0.1 K/UL    Absolute Immature Granulocyte 0.1 (H) 0.00 - 0.04 K/UL    Differential Type AUTOMATED     Comprehensive Metabolic Panel    Collection Time: 12/26/21  8:38 PM   Result Value Ref Range    Sodium 137 136 - 145 mmol/L    Potassium 6.2 (HH) 3.5 - 5.5 mmol/L    Chloride 106 100 - 111 mmol/L    CO2 26 21 - 32 mmol/L    Anion Gap 5 3.0 - 18 mmol/L    Glucose 385 (H) 74 - 99 mg/dL    BUN 21 (H) 7.0 - 18 MG/DL    Creatinine 1.00 0.6 - 1.3 MG/DL    Bun/Cre Ratio 21 (H) 12 - 20      Est, Glom Filt Rate >60 >60 ml/min/1.73m    Calcium 10.7 (H) 8.5 - 10.1 MG/DL    Total Bilirubin 0.5 0.2 - 1.0 MG/DL    ALT 34 13 - 56 U/L    AST 60 (H) 10 - 38 U/L    Alk Phosphatase 121 (H) 45 - 117 U/L    Total Protein 6.4 6.4 - 8.2 g/dL    Albumin 2.7 (L) 3.4 - 5.0 g/dL    Globulin 3.7 2.0 - 4.0 g/dL    Albumin/Globulin Ratio 0.7 (L) 0.8 - 1.7     Troponin    Collection Time: 12/26/21  8:38 PM   Result Value Ref Range    Troponin, High Sensitivity 11 0 - 54 ng/L   Sedimentation Rate    Collection Time: 12/26/21  8:38 PM   Result Value Ref Range    Sed Rate, Automated 66 (H) 0 - 30 mm/hr   C-Reactive Protein    Collection Time: 12/26/21  8:38 PM   Result Value Ref Range    CRP 9.6 (H) 0 - 0.3 mg/dL   BMP    Collection Time: 12/26/21 10:00 PM   Result Value Ref Range    Sodium 141 136 - 145 mmol/L    Potassium 3.8 3.5 - 5.5 mmol/L    Chloride 111 100 - 111 mmol/L    CO2 26 21 - 32 mmol/L    Anion Gap 4 3.0 - 18 mmol/L    Glucose 398 (H) 74 - 99 mg/dL    BUN 20 (H) 7.0 - 18 MG/DL    Creatinine 1.00 0.6 - 1.3 MG/DL    Bun/Cre Ratio 20 12 -  20      Est, Glom Filt Rate >60 >60 ml/min/1.765m   Calcium 10.5 (H) 8.5 - 10.1 MG/DL   POCT Glucose    Collection Time: 12/27/21  5:25 AM   Result Value Ref Range    POC Glucose 211 (H) 70 - 110 mg/dL   CBC with Auto Differential    Collection Time: 12/27/21  7:52 AM   Result Value Ref Range    WBC 7.5 4.6 - 13.2 K/uL    RBC 3.65 (L) 4.20 - 5.30 M/uL    Hemoglobin 9.0 (L) 12.0 - 16.0 g/dL  Hematocrit 28.7 (L) 35.0 - 45.0 %    MCV 78.6 78.0 - 100.0 FL    MCH 24.7 24.0 - 34.0 PG    MCHC 31.4 31.0 - 37.0 g/dL    RDW 15.3 (H) 11.6 - 14.5 %    Platelets 328 135 - 420 K/uL    MPV 9.3 9.2 - 11.8 FL    Nucleated RBCs 0.0 0 PER 100 WBC    nRBC 0.00 0.00 - 0.01 K/uL    Neutrophils % 84 (H) 40 - 73 %    Lymphocytes % 7 (L) 21 - 52 %    Monocytes % 7 3 - 10 %    Eosinophils % 2 0 - 5 %    Basophils % 0 0 - 2 %    Immature Granulocytes 0 0.0 - 0.5 %    Neutrophils Absolute 6.3 1.8 - 8.0 K/UL    Lymphocytes Absolute 0.5 (L) 0.9 - 3.6 K/UL    Monocytes Absolute 0.5 0.05 - 1.2 K/UL    Eosinophils Absolute 0.1 0.0 - 0.4 K/UL    Basophils Absolute 0.0 0.0 - 0.1 K/UL    Absolute Immature Granulocyte 0.0 0.00 - 0.04 K/UL    Differential Type AUTOMATED     Basic Metabolic Panel    Collection Time: 12/27/21  7:52 AM   Result Value Ref Range    Sodium 145 136 - 145 mmol/L    Potassium 3.4 (L) 3.5 - 5.5 mmol/L    Chloride 115 (H) 100 - 111 mmol/L    CO2 27 21 - 32 mmol/L    Anion Gap 3 3.0 - 18 mmol/L    Glucose 180 (H) 74 - 99 mg/dL    BUN 15 7.0 - 18 MG/DL    Creatinine 0.76 0.6 - 1.3 MG/DL    Bun/Cre Ratio 20 12 - 20      Est, Glom Filt Rate >60 >60 ml/min/1.52m    Calcium 10.6 (H) 8.5 - 10.1 MG/DL   POCT Glucose    Collection Time: 12/27/21 10:26 AM   Result Value Ref Range    POC Glucose 192 (H) 70 - 110 mg/dL           [place]

## 2021-12-27 NOTE — ED Notes (Signed)
PATIENT RETURNED AFTER HAVING ULTRASOUND. RELATIVE PRESENT     Arletha Pili, RN  12/27/21 1921

## 2021-12-27 NOTE — Consults (Addendum)
Consult    Patient: [name] Short MRN: [id number]  SSN: [id number]    Date of Birth: March 30, 1956  Age: 66 y.o.  Sex: female      Subjective:      [name] Short is a 66 y.o. female who is being seen for weakness currently.  Patient is a little over 2 weeks status post a posterior cervical decompression and fusion for myelopathy.  She had been doing well.    Family says  that she is not walking as much as they would like her to.  They also noted that yesterday morning she had right arm and right leg weakness that they felt was more pronounced.  She was coming back from her primary care doctor and felt weak and could not get up.      She had no particular complaints of pain on arrival extremities.  Right greater than left leg weakness.      She was brought to the emergency room.      Was found to be afebrile.  Labs in the emergency room had a white count of 10 blood glucose nearly 400.  Given her recent surgery and weakness MRIs were obtained of her spinal axis.  I reviewed them.  They have not been officially read.  MRI of the cervical spine demonstrates postoperative changes without cord compression a small amount of subcutaneous fluid normal postoperative is present.  No evidence of infection.  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  No acute changes is noted.  No trauma no infection.    Patient currently is somnolent.  She has been up all night with studies and was recently given Ativan.  I cannot perform a neurologic exam.    Past Medical History:   Diagnosis Date    Anemia     Arthritis     Chronic pain     Legs and Shoulders    Diabetes ([place])     Exposure to asbestos     History of blood transfusion     History of colon polyps     HTN (hypertension)     Hx of blood clots     DVT    Hyperlipemia     Menopause     Pulmonary nodule     Serum calcium elevated     Sleep apnea     not using cpap    Stroke [facility])      Past Surgical History:   Procedure Laterality Date    CESAREAN SECTION  1987     COLONOSCOPY N/A 01/13/2017    COLONOSCOPY performed by Joycelyn Schmid, MD at [place] N/A 12/09/2021    CERVICAL THREE/FOUR/FIVE/SIX LAMINECTOMY FUSION; C-ARM; STRYKER; EXT BONE GROWTH STIM; 23 HR performed by Candi Leash, MD at [facility] MAIN OR      Family History   Problem Relation Age of Onset    Diabetes Other 20        parent, NOS    Cancer Father     Hypertension Other 35        parent,NOS    Heart Disease Other 58        parent, NOS    Lung Disease Father      Social History     Tobacco Use    Smoking status: Every Day     Packs/day: 1.00  Types: Cigarettes, Cigars     Last attempt to quit: 08/2019     Years since quitting: 2.3    Smokeless tobacco: Never   Substance Use Topics    Alcohol use: Yes     Comment: occ      No current facility-administered medications for this encounter.     Current Outpatient Medications   Medication Sig Dispense Refill    Aspirin (VAZALORE) 81 MG CAPS Take 1 capsule by mouth      lisinopril (PRINIVIL;ZESTRIL) 40 MG tablet Take 1 tablet by mouth daily      acetaminophen (TYLENOL) 500 MG tablet Take 1 tablet by mouth every 4 hours as needed for Fever or Pain      polyethylene glycol (GLYCOLAX) 17 g packet Take 17 g by mouth 2 times daily.      ascorbic acid (VITAMIN C) 250 MG tablet Take 1 tablet by mouth daily      clopidogrel (PLAVIX) 75 MG tablet Take 1 tablet by mouth daily      Continuous Blood Gluc Sensor (FREESTYLE LIBRE 3 SENSOR) MISC MISCELLANEOUS CHANGE NEW SENSOR EVERY 14 DAYS (Patient not taking: Reported on 11/06/2021)      cyclobenzaprine (FLEXERIL) 5 MG tablet Take 5 mg by mouth daily as needed      BYDUREON BCISE 2 MG/0.85ML injection Inject 2 mg into the skin every 7 days every Sunday      ferrous sulfate (IRON 325) 325 (65 Fe) MG tablet Take 1 tablet by mouth daily      furosemide (LASIX) 40 MG tablet furosemide 40 mg tablet   TAKE 1 TABLET BY MOUTH EVERY DAY      gabapentin (NEURONTIN)  100 MG capsule Take 1 capsule by mouth as needed.      Glucagon, rDNA, (GLUCAGON EMERGENCY) 1 MG KIT INJECT 1 MILLIGRAM(S) INJECTION EVERY DAY AS NEEDED FOR LOW BLOOD DUGAR      HUMALOG MIX 75/25 KWIKPEN (75-25) 100 UNIT/ML SUPN injection pen Inject 0.17 mLs into the skin 2 times daily (with meals)      pantoprazole (PROTONIX) 40 MG tablet Take 40 mg by mouth daily as needed (acid reflux, heartburn)          Allergies   Allergen Reactions    Lactose Other (See Comments)     Intolerance-Pt denies       Review of Systems:  Pertinent items are noted in the History of Present Illness.    Objective:     Vitals:    12/26/21 2230 12/27/21 0015 12/27/21 0511   BP: 114/65 (!) 115/58 (!) 104/56   Pulse: 95 99 (!) 103   Resp: 16 14 15    Temp: 99.4 F (37.4 C) 100.3 F (37.9 C) 99.6 F (37.6 C)   TempSrc: Oral  Oral   SpO2: 100% 99% 97%        Physical Exam:  Patient recently sedated and somnolent no exam available.  Discussions with ER staff earlier indicated.  Normal neurological exam discussions with patient's family indicated possible right arm and right leg weakness.  Her posterior cervical incision is benign and nontender.    Radiographs as discussed demonstrate postoperative changes in the cervical spine normal thoracic spine chronic degenerative changes in her lumbar spine with chronic areas of spinal stenosis without acute change.  Assessment:       My overall sense is that her weakness may be due to hyperglycemia with sugars in the range of 400, deconditioning and activity.  The family  has mention of a unilateral weakness on the right and left foot always when he rises concern for stroke.  There was not evidence to the emergency room staff on presentation.  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    Plan:     I would recommend reevaluation once sedation resolves.     if patient has significant right upper or lower extremity weakness I would have neurology evaluation consider MRI of the brain.       Rule out more acute central event causing right-sided weakness.      I see no postoperative complication or issue here.      I would monitor her blood glucoses closely and see if we can get them under better control.    I have ordered repeat labs for her    Signed By: Candi Leash, MD     Dec 27, 2021

## 2021-12-27 NOTE — Progress Notes (Signed)
[facility]   Pharmacy Pharmacokinetic Monitoring Service - Vancomycin     [name] Short is a 66 y.o. female starting on vancomycin therapy for Skin and Soft Tissue Infection. Pharmacy consulted for monitoring and adjustment.    Target Concentration: Goal AUC/MIC 400-600 mg*hr/L    Additional Antimicrobials: Piperacillin/Tazobactam    Pertinent Laboratory Values:   Temp: (!) 101.8 F (38.8 C) (RN cassidy is aware of tem amd PR), Weight - Scale: 141 lb 1.5 oz (64 kg)  Recent Labs     12/26/21  2038 12/26/21  2200 12/27/21  0752   CREATININE 1.00 1.00 0.76   BUN 21* 20* 15   WBC 10.9  --  7.5     Estimated Creatinine Clearance: 66 mL/min (based on SCr of 0.76 mg/dL).    Pertinent Cultures:  Culture Date Source Results   05/26 Wound Pending   MRSA Nasal Swab: N/A. Non-respiratory infection    Plan:  Dosing recommendations based on Bayesian software  Start vancomycin 1500 mg IV x 1 followed by 750 mg IV q12h  Anticipated AUC of 474 and trough concentration of 15.2 at steady state  Renal labs as indicated   Vancomycin concentration ordered for  05/28 @ 0400  Pharmacy will continue to monitor patient and adjust therapy as indicated    Thank you for the consult,  Silvestre Moment, [facility]  12/27/2021

## 2021-12-28 ENCOUNTER — Inpatient Hospital Stay: Payer: PRIVATE HEALTH INSURANCE | Primary: Physician Assistant

## 2021-12-28 ENCOUNTER — Inpatient Hospital Stay: Admit: 2021-12-28 | Discharge: 2022-01-01 | Payer: PRIVATE HEALTH INSURANCE | Primary: Physician Assistant

## 2021-12-28 ENCOUNTER — Inpatient Hospital Stay: Admit: 2021-12-28 | Payer: PRIVATE HEALTH INSURANCE | Primary: Physician Assistant

## 2021-12-28 LAB — CBC WITH AUTO DIFFERENTIAL
Basophils %: 0 % (ref 0–2)
Basophils Absolute: 0 10*3/uL (ref 0.0–0.1)
Eosinophils %: 1 % (ref 0–5)
Eosinophils Absolute: 0.1 10*3/uL (ref 0.0–0.4)
Hematocrit: 27.1 % — ABNORMAL LOW (ref 35.0–45.0)
Hemoglobin: 8.5 g/dL — ABNORMAL LOW (ref 12.0–16.0)
Immature Granulocytes %: 1 % — ABNORMAL HIGH (ref 0.0–0.5)
Immature Granulocytes Absolute: 0.1 10*3/uL — ABNORMAL HIGH (ref 0.00–0.04)
Lymphocytes %: 6 % — ABNORMAL LOW (ref 21–52)
Lymphocytes Absolute: 0.6 10*3/uL — ABNORMAL LOW (ref 0.9–3.6)
MCH: 24.7 pg (ref 24.0–34.0)
MCHC: 31.4 g/dL (ref 31.0–37.0)
MCV: 78.8 FL (ref 78.0–100.0)
MPV: 9.7 FL (ref 9.2–11.8)
Monocytes %: 11 % — ABNORMAL HIGH (ref 3–10)
Monocytes Absolute: 1.1 10*3/uL (ref 0.05–1.2)
Neutrophils %: 82 % — ABNORMAL HIGH (ref 40–73)
Neutrophils Absolute: 8.5 10*3/uL — ABNORMAL HIGH (ref 1.8–8.0)
Nucleated RBCs: 0 /100{WBCs}
Platelets: 339 10*3/uL (ref 135–420)
RBC: 3.44 M/uL — ABNORMAL LOW (ref 4.20–5.30)
RDW: 15.6 % — ABNORMAL HIGH (ref 11.6–14.5)
WBC: 10.4 10*3/uL (ref 4.6–13.2)
nRBC: 0 10*3/uL (ref 0.00–0.01)

## 2021-12-28 LAB — ECHO (TTE) COMPLETE (PRN CONTRAST/BUBBLE/STRAIN/3D)
AV Area by Peak Velocity: 1.6 cm2
AV Area by VTI: 1.6 cm2
AV Mean Gradient: 5 mmHg
AV Mean Velocity: 1.1 m/s
AV Peak Gradient: 9 mmHg
AV Peak Velocity: 1.5 m/s
AV VTI: 29.3 cm
AV Velocity Ratio: 0.67
AVA/BSA Peak Velocity: 1 cm2/m2
AVA/BSA VTI: 1 cm2/m2
Ao Root Index: 1.69 cm/m2
Aortic Root: 2.7 cm
Body Surface Area: 1.65 m2
E/E' Lateral: 9.88
Fractional Shortening 2D: 27 % (ref 28–44)
IVSd: 1.3 cm — AB (ref 0.6–0.9)
LA Volume A-L A4C: 21 mL — AB (ref 22–52)
LA Volume A-L A4C: 36 mL (ref 22–52)
LA Volume A/L: 28 mL
LA Volume BP: 27 mL (ref 22–52)
LA Volume Index A-L A2C: 23 mL/m2 (ref 16–34)
LA Volume Index A-L A4C: 13 mL/m2 — AB (ref 16–34)
LA Volume Index A/L: 18 mL/m2 (ref 16–34)
LA Volume Index BP: 17 mL/m2 (ref 16–34)
LV E' Lateral Velocity: 8 cm/s
LV Mass 2D Index: 109.4 g/m2 — AB (ref 43–95)
LV Mass 2D: 175 g — AB (ref 67–162)
LV RWT Ratio: 0.4
LVIDd Index: 2.81 cm/m2
LVIDd: 4.5 cm (ref 3.9–5.3)
LVIDs Index: 2.06 cm/m2
LVIDs: 3.3 cm
LVOT Area: 2.3 cm2
LVOT Diameter: 1.7 cm
LVOT Mean Gradient: 3 mmHg
LVOT Peak Gradient: 4 mmHg
LVOT Peak Velocity: 1 m/s
LVOT SV: 43.6 mL
LVOT Stroke Volume Index: 27.2 mL/m2
LVOT VTI: 19.2 cm
LVOT:AV VTI Index: 0.66
LVPWd: 0.9 cm (ref 0.6–0.9)
MV A Velocity: 1.02 m/s
MV E Velocity: 0.79 m/s
MV E Wave Deceleration Time: 160.2 ms
MV E/A: 0.77
PV Max Velocity: 0.9 m/s
PV Peak Gradient: 3 mmHg
TAPSE: 2.1 cm (ref 1.7–?)
TR Max Velocity: 0.16 m/s
TR Peak Gradient: 0 mmHg

## 2021-12-28 LAB — POCT GLUCOSE
POC Glucose: 298 mg/dL — ABNORMAL HIGH (ref 70–110)
POC Glucose: 280 mg/dL — ABNORMAL HIGH (ref 70–110)
POC Glucose: 243 mg/dL — ABNORMAL HIGH (ref 70–110)
POC Glucose: 244 mg/dL — ABNORMAL HIGH (ref 70–110)
POC Glucose: 218 mg/dL — ABNORMAL HIGH (ref 70–110)

## 2021-12-28 LAB — TROPONIN
Troponin, High Sensitivity: 11 ng/L (ref 0–54)
Troponin, High Sensitivity: 10 ng/L (ref 0–54)

## 2021-12-28 LAB — PTH, INTACT
Calcium: 10.6 mg/dL — ABNORMAL HIGH (ref 8.5–10.1)
Pth Intact: 120.7 pg/mL — ABNORMAL HIGH (ref 18.4–88.0)

## 2021-12-28 MED ORDER — PROPOFOL 200 MG/20ML IV EMUL
200 MG/20ML | INTRAVENOUS | Status: AC
Start: 2021-12-28 — End: ?

## 2021-12-28 MED ORDER — VANCOMYCIN 1500 MG NS 500 ML (PREMIX) IVPB
Freq: Once | Status: AC
Start: 2021-12-28 — End: 2021-12-28
  Administered 2021-12-28: 04:00:00 1500 mg/kg via INTRAVENOUS

## 2021-12-28 MED ORDER — FENTANYL CITRATE (PF) 100 MCG/2ML IJ SOLN
100 MCG/2ML | INTRAMUSCULAR | Status: DC | PRN
Start: 2021-12-28 — End: 2021-12-28
  Administered 2021-12-28 (×2): 50 via INTRAVENOUS

## 2021-12-28 MED ORDER — ONDANSETRON HCL 4 MG/2ML IJ SOLN
4 MG/2ML | INTRAMUSCULAR | Status: DC | PRN
Start: 2021-12-28 — End: 2021-12-28
  Administered 2021-12-28: 15:00:00 4 via INTRAVENOUS

## 2021-12-28 MED ORDER — FENTANYL CITRATE (PF) 100 MCG/2ML IJ SOLN
100 MCG/2ML | INTRAMUSCULAR | Status: AC
Start: 2021-12-28 — End: ?

## 2021-12-28 MED ORDER — ONDANSETRON HCL 4 MG/2ML IJ SOLN
4 MG/2ML | INTRAMUSCULAR | Status: AC
Start: 2021-12-28 — End: ?

## 2021-12-28 MED ORDER — ONDANSETRON HCL 4 MG/2ML IJ SOLN
4 MG/2ML | Freq: Once | INTRAMUSCULAR | Status: DC | PRN
Start: 2021-12-28 — End: 2021-12-28

## 2021-12-28 MED ORDER — LACTATED RINGERS IV SOLN
INTRAVENOUS | Status: AC
Start: 2021-12-28 — End: 2022-01-06
  Administered 2021-12-28 – 2022-01-05 (×8): via INTRAVENOUS

## 2021-12-28 MED ORDER — LIDOCAINE HCL (PF) 2 % IJ SOLN
2 % | INTRAMUSCULAR | Status: DC | PRN
Start: 2021-12-28 — End: 2021-12-28
  Administered 2021-12-28: 14:00:00 80 via INTRAVENOUS

## 2021-12-28 MED ORDER — INSULIN LISPRO 100 UNIT/ML IJ SOLN
100 UNIT/ML | Freq: Once | INTRAMUSCULAR | Status: AC
Start: 2021-12-28 — End: 2021-12-28
  Administered 2021-12-28: 15:00:00 6 [IU] via SUBCUTANEOUS

## 2021-12-28 MED ORDER — PROPOFOL 200 MG/20ML IV EMUL
200 MG/20ML | INTRAVENOUS | Status: DC | PRN
Start: 2021-12-28 — End: 2021-12-28
  Administered 2021-12-28: 14:00:00 100 via INTRAVENOUS

## 2021-12-28 MED ORDER — VANCOMYCIN HCL 750 MG IV SOLR
750 MG | Freq: Two times a day (BID) | INTRAVENOUS | Status: AC
Start: 2021-12-28 — End: 2022-01-01
  Administered 2021-12-28 – 2022-01-01 (×8): 750 mg via INTRAVENOUS

## 2021-12-28 MED ORDER — BUPIVACAINE-EPINEPHRINE (PF) 0.5% -1:200000 IJ SOLN
INTRAMUSCULAR | Status: AC
Start: 2021-12-28 — End: ?

## 2021-12-28 MED ORDER — FUROSEMIDE 40 MG PO TABS
40 MG | Freq: Every day | ORAL | Status: AC
Start: 2021-12-28 — End: 2022-01-06
  Administered 2021-12-28 – 2022-01-06 (×11): 40 mg via ORAL

## 2021-12-28 MED ORDER — FENTANYL CITRATE (PF) 100 MCG/2ML IJ SOLN
100 MCG/2ML | INTRAMUSCULAR | Status: DC | PRN
Start: 2021-12-28 — End: 2021-12-28

## 2021-12-28 MED ORDER — LABETALOL HCL 5 MG/ML IV SOLN
5 MG/ML | INTRAVENOUS | Status: DC | PRN
Start: 2021-12-28 — End: 2021-12-28

## 2021-12-28 MED ORDER — INSULIN GLARGINE 100 UNIT/ML SC SOLN
100 UNIT/ML | Freq: Every day | SUBCUTANEOUS | Status: AC
Start: 2021-12-28 — End: 2021-12-29
  Administered 2021-12-28 – 2021-12-29 (×2): 10 [IU] via SUBCUTANEOUS

## 2021-12-28 MED ORDER — LIDOCAINE HCL (PF) 2 % IJ SOLN
2 % | INTRAMUSCULAR | Status: AC
Start: 2021-12-28 — End: ?

## 2021-12-28 MED ORDER — LIDOCAINE-EPINEPHRINE 0.5 %-1:200000 IJ SOLN
0.5 %-1:200000 | INTRAMUSCULAR | Status: DC | PRN
Start: 2021-12-28 — End: 2021-12-28
  Administered 2021-12-28: 15:00:00 30 via SUBCUTANEOUS

## 2021-12-28 MED ORDER — HYDROCODONE-ACETAMINOPHEN 5-325 MG PO TABS
5-325 | ORAL | Status: DC | PRN
Start: 2021-12-28 — End: 2022-01-06
  Administered 2021-12-28 – 2022-01-06 (×23): 1 via ORAL

## 2021-12-28 MED ORDER — IOPAMIDOL 61 % IV SOLN
61 % | Freq: Once | INTRAVENOUS | Status: AC | PRN
Start: 2021-12-28 — End: 2021-12-28
  Administered 2021-12-28: 19:00:00 80 mL via INTRAVENOUS

## 2021-12-28 MED ORDER — HYDROMORPHONE 0.5MG/0.5ML IJ SOLN
1 | Status: DC | PRN
Start: 2021-12-28 — End: 2021-12-28

## 2021-12-28 MED ORDER — ACETAMINOPHEN 500 MG PO TABS
500 | ORAL | Status: DC | PRN
Start: 2021-12-28 — End: 2021-12-30
  Administered 2021-12-28: 02:00:00 500 mg via ORAL

## 2021-12-28 MED FILL — VANCOMYCIN HCL 750 MG IV SOLR: 750 MG | INTRAVENOUS | Qty: 750

## 2021-12-28 MED FILL — HUMALOG 100 UNIT/ML IJ SOLN: 100 UNIT/ML | INTRAMUSCULAR | Qty: 1

## 2021-12-28 MED FILL — LACTATED RINGERS IV SOLN: INTRAVENOUS | Qty: 1000

## 2021-12-28 MED FILL — SENSORCAINE-MPF/EPINEPHRINE 0.5% -1:200000 IJ SOLN: INTRAMUSCULAR | Qty: 30

## 2021-12-28 MED FILL — ACETAMINOPHEN EXTRA STRENGTH 500 MG PO TABS: 500 MG | ORAL | Qty: 1

## 2021-12-28 MED FILL — HUMALOG 100 UNIT/ML IJ SOLN: 100 UNIT/ML | INTRAMUSCULAR | Qty: 6

## 2021-12-28 MED FILL — POTASSIUM CHLORIDE 10 MEQ/100ML IV SOLN: 10 MEQ/0ML | INTRAVENOUS | Qty: 100

## 2021-12-28 MED FILL — DIPRIVAN 200 MG/20ML IV EMUL: 200 MG/20ML | INTRAVENOUS | Qty: 20

## 2021-12-28 MED FILL — FUROSEMIDE 40 MG PO TABS: 40 MG | ORAL | Qty: 1

## 2021-12-28 MED FILL — XYLOCAINE-MPF 2 % IJ SOLN: 2 % | INTRAMUSCULAR | Qty: 5

## 2021-12-28 MED FILL — H-CHLOR 12 0.125 % EX SOLN: 0.125 % | CUTANEOUS | Qty: 473

## 2021-12-28 MED FILL — PIPERACILLIN SOD-TAZOBACTAM SO 3.375 (3-0.375) G IV SOLR: 3.375 (3-0.375) g | INTRAVENOUS | Qty: 3375

## 2021-12-28 MED FILL — LANTUS 100 UNIT/ML SC SOLN: 100 UNIT/ML | SUBCUTANEOUS | Qty: 1

## 2021-12-28 MED FILL — LANTUS 100 UNIT/ML SC SOLN: 100 UNIT/ML | SUBCUTANEOUS | Qty: 10

## 2021-12-28 MED FILL — HYDROCODONE-ACETAMINOPHEN 5-325 MG PO TABS: 5-325 MG | ORAL | Qty: 1

## 2021-12-28 MED FILL — VANCOMYCIN HCL 10 G IV SOLR: 10 g | INTRAVENOUS | Qty: 1500

## 2021-12-28 MED FILL — ISOVUE-300 61 % IV SOLN: 61 % | INTRAVENOUS | Qty: 80

## 2021-12-28 MED FILL — FENTANYL CITRATE (PF) 100 MCG/2ML IJ SOLN: 100 MCG/2ML | INTRAMUSCULAR | Qty: 2

## 2021-12-28 MED FILL — ONDANSETRON HCL 4 MG/2ML IJ SOLN: 4 MG/2ML | INTRAMUSCULAR | Qty: 2

## 2021-12-28 MED FILL — DAKINS (1/4 STRENGTH) 0.125 % EX SOLN: 0.125 % | CUTANEOUS | Qty: 473

## 2021-12-28 NOTE — Anesthesia Post-Procedure Evaluation (Signed)
Department of Anesthesiology  Postprocedure Note    Patient: [name] Short  MRN: [id number]  Birthdate: 06/04/1956  Date of evaluation: 12/28/2021      Procedure Summary     Date: 12/28/21 Room / Location: [facility] MAIN 07 / [facility] MAIN OR    Anesthesia Start: 1003 Anesthesia Stop: 1046    Procedure: SACRAL WOUND DEBRIDEMENT INCISION AND DRAINAGE Diagnosis:       Wound of sacral region, initial encounter      (Wound of sacral region, initial encounter [S31.000A])    Surgeons: Phineas Semen, DO Responsible Provider: Billy Fischer, MD    Anesthesia Type: General ASA Status: 3 - Emergent          Anesthesia Type: General    Aldrete Phase I: Aldrete Score: 9    Aldrete Phase II:        Anesthesia Post Evaluation    Patient location during evaluation: bedside  Patient participation: complete - patient participated  Level of consciousness: responsive to verbal stimuli  Airway patency: patent  Nausea & Vomiting: no nausea  Complications: no  Respiratory status: acceptable  Hydration status: euvolemic

## 2021-12-28 NOTE — Consults (Signed)
Comprehensive Nutrition Assessment    Type and Reason for Visit:  Initial, Consult    Nutrition Recommendations/Plan:   Modify po diet; add 4 carb choice/ meal (60 gm carb) for improved glycemic control  Add supplement: Juven BID (95 kcal, 2.5 gm collagen protein each)  Continue all other nutrition interventions. Encourage/ monitor po intake of meals and supplements.      Malnutrition Assessment:  Malnutrition Status:  No malnutrition (12/28/21 1637)      Nutrition History and Allergies:   Past medical hx:  anemia, arthritis, DM, hx of colon polyps, HTN, HLD, stroke.   Pt reported good appetite/ meal intake PTA.   Pt reported UBW was 140+ lb and had unplanned wt loss. Wt of 144 lb upon admission. Checked wt on 12/28/21, but pt weighing 154 lb via bed scale (did have prevalon boots and blanks).   Per chart hx, pt weighing 152 lb on 09/21/20,  144 lb on 12/28/21.   No known food allergies (lactose intolerance)     Nutrition Assessment:    Pt reported having good appetite; was hungry for lunch. Was off unit at breakfast this morning for sacral wound debridement incision and drainage. Discussed adding protein supplement for promotion of wound healing; pt agreeable to trying. Also encouraged pt to monitor/ control BG levels post discharge as uncontrolled BG levels slow wound healing process; pt verbalized understanding. Noted A1c of 7.6%. Pt with recent BG levels of 218- 298 mg/dL in past 24 hours; plan to add carb restrictions to diet order.    Nutrition Related Findings:    BM 5/26.   + edema.   Pertinent meds: lasix, lantus, SSI, antibiotic.  IVF, LR at 75 mL/hr. Wound Type: Surgical Incision (buttocks wound)       Current Nutrition Intake & Therapies:    Average Meal Intake: Unable to assess  Average Supplements Intake: None Ordered  ADULT DIET; Regular    Anthropometric Measures:  Height: 4\' 11"  (149.9 cm)  Ideal Body Weight (IBW): 95 lbs (43 kg)    Admission Body Weight: 144 lb (65.3 kg)  Current Body Weight: 144 lb  (65.3 kg), 151.6 % IBW. Weight Source: Bed Scale  Current BMI (kg/m2): 29.1  Usual Body Weight: 152 lb (68.9 kg)  % Weight Change (Calculated): -5.3  Weight Adjustment For: No Adjustment  BMI Categories: Overweight (BMI 25.0-29.9)    Estimated Daily Nutrient Needs:  Energy Requirements Based On: Formula (MSJ x1.2-1.4)  Weight Used for Energy Requirements: Admission  Energy (kcal/day): 06-06-2001  Weight Used for Protein Requirements: Admission  Protein (g/day): 65-91 (wt x1-1.4)  Method Used for Fluid Requirements: 1 ml/kcal  CHO (g/day): 167-195   Method Used for CHO Requirements: 50% total kcal.  Fluid (ml/day): 4174-0814    Nutrition Diagnosis:   Increased nutrient needs related to increase demand for energy/nutrients as evidenced by wounds  Altered nutrition-related lab values related to endocrine dysfuntion as evidenced by lab values    Nutrition Interventions:   Food and/or Nutrient Delivery: Start Oral Nutrition Supplement, Modify Current Diet  Nutrition Education/Counseling: Education not indicated  Coordination of Nutrition Care: Continue to monitor while inpatient  Plan of Care discussed with: pt    Goals:     Goals: Meet at least 75% of estimated needs, by next RD assessment       Nutrition Monitoring and Evaluation:   Behavioral-Environmental Outcomes: None Identified  Food/Nutrient Intake Outcomes: Food and Nutrient Intake, Supplement Intake  Physical Signs/Symptoms Outcomes: Biochemical Data, GI Status, Meal  Time Behavior, Skin, Weight    Discharge Planning:    Continue current diet, Continue Oral Nutrition Supplement     [place]e [place], [place]  Contact: [phone]

## 2021-12-28 NOTE — Progress Notes (Signed)
Occupational Therapy  This patient is sleeping soundly following her procedure in therapy. Discussed with her daughter who reports she was awake and talking and just "conked out". Will follow up tomorrow as appropriate for this patient. [name] Short [name] Short, OTR/L

## 2021-12-28 NOTE — Progress Notes (Addendum)
Lying in bed  Daughter feeding patient  Neurologically intact  S/p debridement sacral decubitus ulcer this morning   Concern for infection increased now due to stage 4 sacral decubitus ulcer  Continues with weakness in her legs and arm   CT head negative for CVA  MRI cervical spine with resolved cord compression post surgical changes seroma, no active signs of infection    Recommend  Close control of blood glucose  Wound care management  Likely deconditioned recommending snf/ rehab

## 2021-12-28 NOTE — Plan of Care (Signed)
Problem: Safety - Adult  Goal: Free from fall injury  Outcome: Progressing     Problem: Skin/Tissue Integrity  Goal: Absence of new skin breakdown  Description: 1.  Monitor for areas of redness and/or skin breakdown  2.  Assess vascular access sites hourly  3.  Every 4-6 hours minimum:  Change oxygen saturation probe site  4.  Every 4-6 hours:  If on nasal continuous positive airway pressure, respiratory therapy assess nares and determine need for appliance change or resting period.  Outcome: Progressing     Problem: ABCDS Injury Assessment  Goal: Absence of physical injury  Outcome: Progressing     Problem: Chronic Conditions and Co-morbidities  Goal: Patient's chronic conditions and co-morbidity symptoms are monitored and maintained or improved  Outcome: Progressing

## 2021-12-28 NOTE — Progress Notes (Signed)
Advance Care Planning     Advance Care Planning Inpatient Note  Spiritual Care Department    Today's Date: 12/28/2021  Unit: [facility]    Received request from admission screening.  Upon review of chart and communication with care team, patient's decision making abilities are not in question.. Patient and Child/Children was/were present in the room during visit.    Goals of ACP Conversation:  Discuss advance care planning documents    Health Care Decision Makers:     No healthcare decision makers have been documented.  Click here to complete Clinical research associate of the Environmental health practitioner Relationship (ie "Primary")  Summary:  No Decision Maker named by patient at this time  I left the Advance Medical Directive form with the patient and daughter. Perhaps, they will complete later.    Advance Care Planning Documents (Patient Wishes):  None     Assessment:     12/28/21 1332   Encounter Summary   Encounter Overview/Reason  Initial Encounter   Service Provided For: Patient and family together   Referral/Consult From: Nurse   Support System Children;Family members   Last Encounter  12/28/21  (IV-SA-KP)   Complexity of Encounter Moderate   Begin Time 1324   End Time  1333   Total Time Calculated 9 min   Encounter    Type Initial Screen/Assessment   Spiritual/Emotional needs   Type Spiritual Support   Assessment/Intervention/Outcome   Assessment Coping   Intervention Active listening   Outcome Encouraged   Plan and Referrals   Plan/Referrals Continue to visit, (comment)           Interventions:  Patient DECLINED ACP conversation    Care Preferences Communicated:   No    Outcomes/Plan:  ACP Discussion: Refused    Electronically signed by Eilene Ghazi, BCC on 12/28/2021 at 1:33 PM

## 2021-12-28 NOTE — Progress Notes (Signed)
PHYSICAL THERAPY CONSULT RECEIVED AND CHART REVIEWED    Discussed with nursing who notes patient is currently NPO and scheduled for sacral surgery today.    Agreed and plan to hold at this time due to surgery today and will continue to follow.     Thank you,  Tawana Scale. Glory Buff, PT

## 2021-12-28 NOTE — Plan of Care (Signed)
Problem: Safety - Adult  Goal: Free from fall injury  12/28/2021 1523 by Nathen May, RN  Outcome: Progressing  12/28/2021 1314 by Nathen May, RN  Outcome: Progressing     Problem: Skin/Tissue Integrity  Goal: Absence of new skin breakdown  Description: 1.  Monitor for areas of redness and/or skin breakdown  2.  Assess vascular access sites hourly  3.  Every 4-6 hours minimum:  Change oxygen saturation probe site  4.  Every 4-6 hours:  If on nasal continuous positive airway pressure, respiratory therapy assess nares and determine need for appliance change or resting period.  12/28/2021 1523 by Nathen May, RN  Outcome: Progressing  12/28/2021 1314 by Nathen May, RN  Outcome: Progressing     Problem: ABCDS Injury Assessment  Goal: Absence of physical injury  12/28/2021 1523 by Nathen May, RN  Outcome: Progressing  12/28/2021 1314 by Nathen May, RN  Outcome: Progressing     Problem: Chronic Conditions and Co-morbidities  Goal: Patient's chronic conditions and co-morbidity symptoms are monitored and maintained or improved  12/28/2021 1523 by Nathen May, RN  Outcome: Progressing  12/28/2021 1314 by Nathen May, RN  Outcome: Progressing

## 2021-12-28 NOTE — Anesthesia Pre-Procedure Evaluation (Signed)
Department of Anesthesiology  Preprocedure Note       Name:  [name] Short   Age:  66 y.o.  DOB:  1956/07/25                                          MRN:  [id number]         Date:  12/28/2021      Surgeon: Juliann Mule):  Nestor Ramp, DO    Procedure: Procedure(s):  SACRAL WOUND DEBRIDEMENT INCISION AND DRAINAGE    Medications prior to admission:   Prior to Admission medications    Medication Sig Start Date End Date Taking? Authorizing Provider   lisinopril (PRINIVIL;ZESTRIL) 40 MG tablet Take 1 tablet by mouth daily   Yes Historical Provider, MD   acetaminophen (TYLENOL) 500 MG tablet Take 1 tablet by mouth every 4 hours as needed for Fever or Pain   Yes Historical Provider, MD   polyethylene glycol (GLYCOLAX) 17 g packet Take 17 g by mouth 2 times daily   Yes Historical Provider, MD   clopidogrel (PLAVIX) 75 MG tablet Take 1 tablet by mouth daily 09/16/21  Yes Historical Provider, MD   Continuous Blood Gluc Sensor (FREESTYLE LIBRE 3 SENSOR) MISC  08/01/21  Yes Historical Provider, MD   BYDUREON BCISE 2 MG/0.85ML injection Inject 0.85 mLs into the skin every 7 days every Sunday 09/07/21  Yes Historical Provider, MD   furosemide (LASIX) 40 MG tablet furosemide 40 mg tablet   TAKE 1 TABLET BY MOUTH EVERY DAY 09/23/21  Yes Historical Provider, MD   HUMALOG MIX 75/25 KWIKPEN (75-25) 100 UNIT/ML SUPN injection pen Inject 0.17 mLs into the skin 2 times daily (with meals) 08/11/21  Yes Historical Provider, MD   Glucagon, rDNA, (GLUCAGON EMERGENCY) 1 MG KIT INJECT 1 MILLIGRAM(S) INJECTION EVERY DAY AS NEEDED FOR LOW BLOOD DUGAR 08/18/21   Historical Provider, MD       Current medications:    Current Facility-Administered Medications   Medication Dose Route Frequency Provider Last Rate Last Admin   . iopamidol (ISOVUE-300) 61 % injection 80 mL  80 mL IntraVENous ONCE PRN Ferd Hibbs, DO       . acetaminophen (TYLENOL) tablet 500 mg  500 mg Oral Q4H PRN Ferd Hibbs, DO   500 mg at 12/27/21 2140   . furosemide (LASIX) tablet 40  mg  40 mg Oral Daily Ferd Hibbs, DO   40 mg at 12/27/21 2140   . sodium chloride flush 0.9 % injection 5-40 mL  5-40 mL IntraVENous 2 times per day Ferd Hibbs, DO   10 mL at 12/28/21 0903   . sodium chloride flush 0.9 % injection 5-40 mL  5-40 mL IntraVENous PRN Ferd Hibbs, DO       . 0.9 % sodium chloride infusion   IntraVENous PRN Ferd Hibbs, DO       . potassium chloride 10 mEq/100 mL IVPB (Peripheral Line)  10 mEq IntraVENous PRN Ferd Hibbs, DO 100 mL/hr at 12/27/21 2307 10 mEq at 12/27/21 2307   . magnesium sulfate 2000 mg in 50 mL IVPB premix  2,000 mg IntraVENous PRN Ferd Hibbs, DO       . ondansetron (ZOFRAN-ODT) disintegrating tablet 4 mg  4 mg Oral Q8H PRN Ferd Hibbs, DO        Or   . ondansetron [facility])  injection 4 mg  4 mg IntraVENous Q6H PRN Ferd Hibbs, DO       . polyethylene glycol (GLYCOLAX) packet 17 g  17 g Oral Daily PRN Ferd Hibbs, DO       . acetaminophen (TYLENOL) suppository 650 mg  650 mg Rectal Q6H PRN Ferd Hibbs, DO       . lactated ringers IV soln infusion   IntraVENous Continuous Ferd Hibbs, DO 100 mL/hr at 12/28/21 0630 New Bag at 12/28/21 0630   . sodium hypochlorite (DAKINS) 0.125 % external solution   Irrigation BID Ferd Hibbs, DO   Given at 12/27/21 2353   . piperacillin-tazobactam (ZOSYN) 3,375 mg in sodium chloride 0.9 % 50 mL IVPB (mini-bag)  3,375 mg IntraVENous Q8H Ferd Hibbs, DO 12.5 mL/hr at 12/28/21 0630 3,375 mg at 12/28/21 0630   . glucose chewable tablet 16 g  4 tablet Oral PRN Ferd Hibbs, DO       . dextrose bolus 10% 125 mL  125 mL IntraVENous PRN Ferd Hibbs, DO        Or   . dextrose bolus 10% 250 mL  250 mL IntraVENous PRN Ferd Hibbs, DO       . glucagon (rDNA) injection 1 mg  1 mg SubCUTAneous PRN Ferd Hibbs, DO       . dextrose 10 % infusion   IntraVENous Continuous PRN Ferd Hibbs, DO       . insulin lispro (HUMALOG) injection vial 0-8 Units  0-8 Units SubCUTAneous TID WC Ferd Hibbs, DO   4  Units at 12/28/21 0902   . insulin lispro (HUMALOG) injection vial 0-4 Units  0-4 Units SubCUTAneous Nightly Ferd Hibbs, DO       . vancomycin (VANCOCIN) 750 mg in sodium chloride 0.9 % 250 mL IVPB (vial-mate)  750 mg IntraVENous Q12H Karmen Stabs Reprogle, PA           Allergies:    Allergies   Allergen Reactions   . Lactose Other (See Comments)     Intolerance-Pt denies       Problem List:    Patient Active Problem List   Diagnosis Code   . Iron deficiency anemia due to chronic blood loss D50.0   . Diabetes ([place]) E11.9   . Occult blood in stools R19.5   . Stroke ([place]) I63.9   . Symptomatic anemia D64.9   . Cervical myelopathy ([facility]) G95.9   . Obstructive sleep apnea syndrome G47.33   . Spinal stenosis of lumbar region M48.061   . Syncope R55   . Cerebrovascular accident ([place]) I63.9   . Fall W19.Merril Abbe   . Failure to thrive in adult R62.7   . Skin ulcer of sacrum ([place]) L98.429   . RUQ pain R10.11       Past Medical History:        Diagnosis Date   . Anemia    . Arthritis    . Chronic pain     Legs and Shoulders   . Diabetes ([place])    . Exposure to asbestos    . History of blood transfusion    . History of colon polyps    . HTN (hypertension)    . Hx of blood clots     DVT   . Hyperlipemia    . Menopause    . Pulmonary nodule    . Serum calcium elevated    . Sleep apnea  not using cpap   . Stroke [facility])        Past Surgical History:        Procedure Laterality Date   . CESAREAN SECTION  1987   . COLONOSCOPY N/A 01/13/2017    COLONOSCOPY performed by Joycelyn Schmid, MD at [facility]   . COLONOSCOPY     . DILATION AND CURETTAGE OF UTERUS  1995   . SPINE SURGERY N/A 12/09/2021    CERVICAL THREE/FOUR/FIVE/SIX LAMINECTOMY FUSION; C-ARM; STRYKER; EXT BONE GROWTH STIM; 23 HR performed by Candi Leash, MD at [place]       Social History:    Social History     Tobacco Use   . Smoking status: Every Day     Packs/day: 1.00     Types: Cigarettes, Cigars     Last attempt to quit: 08/2019     Years since quitting: 2.4   .  Smokeless tobacco: Never   Substance Use Topics   . Alcohol use: Yes     Comment: occ                                Ready to quit: Not Answered  Counseling given: Not Answered      Vital Signs (Current):   Vitals:    12/28/21 0504 12/28/21 0600 12/28/21 0642 12/28/21 0757   BP: 121/67   120/68   Pulse: 97   97   Resp: 16   17   Temp: 98.3 F (36.8 C)   98.6 F (37 C)   TempSrc: Oral   Oral   SpO2: 97%   96%   Weight:  144 lb 12.8 oz (65.7 kg) 144 lb (65.3 kg)    Height:   4' 11"  (1.499 m)                                               BP Readings from Last 3 Encounters:   12/28/21 120/68   12/25/21 124/76   12/23/21 138/72       NPO Status: Time of last liquid consumption: 2300                                                 Date of last liquid consumption: 12/27/21                        Date of last solid food consumption: 12/27/21    BMI:   Wt Readings from Last 3 Encounters:   12/28/21 144 lb (65.3 kg)   12/09/21 143 lb (64.9 kg)   08/29/21 154 lb (69.9 kg)     Body mass index is 29.08 kg/m.    CBC:   Lab Results   Component Value Date/Time    WBC 10.4 12/28/2021 04:32 AM    RBC 3.44 12/28/2021 04:32 AM    HGB 8.5 12/28/2021 04:32 AM    HCT 27.1 12/28/2021 04:32 AM    MCV 78.8 12/28/2021 04:32 AM    RDW 15.6 12/28/2021 04:32 AM    PLT 339 12/28/2021 04:32 AM       CMP:   Lab  Results   Component Value Date/Time    NA 145 12/27/2021 07:52 AM    K 3.4 12/27/2021 07:52 AM    CL 115 12/27/2021 07:52 AM    CO2 27 12/27/2021 07:52 AM    BUN 15 12/27/2021 07:52 AM    CREATININE 0.76 12/27/2021 07:52 AM    GFRAA >60 12/11/2020 12:01 PM    AGRATIO 0.9 05/26/2021 02:43 AM    LABGLOM >60 12/27/2021 07:52 AM    GLUCOSE 180 12/27/2021 07:52 AM    PROT 6.4 12/26/2021 08:38 PM    CALCIUM 10.6 12/28/2021 05:45 AM    BILITOT 0.5 12/26/2021 08:38 PM    ALKPHOS 121 12/26/2021 08:38 PM    ALKPHOS 101 05/26/2021 02:43 AM    AST 60 12/26/2021 08:38 PM    ALT 34 12/26/2021 08:38 PM       POC Tests:   Recent Labs     12/28/21  0800    POCGLU 280*       Coags: No results found for: PROTIME, INR, APTT    HCG (If Applicable): No results found for: PREGTESTUR, PREGSERUM, HCG, HCGQUANT     ABGs: No results found for: PHART, PO2ART, PCO2ART, HCO3ART, BEART, O2SATART     Type & Screen (If Applicable):  No results found for: LABABO, LABRH    Drug/Infectious Status (If Applicable):  No results found for: HIV, HEPCAB    COVID-19 Screening (If Applicable): No results found for: COVID19        Anesthesia Evaluation  Patient summary reviewed  Airway: Mallampati: III  TM distance: >3 FB   Neck ROM: limited  Mouth opening: > = 3 FB   Dental:    (+) poor dentition      Pulmonary: breath sounds clear to auscultation  (+) sleep apnea: on CPAP,                             Cardiovascular:  Exercise tolerance: poor (<4 METS),   (+) hypertension: moderate,         Rhythm: regular  Rate: normal                    Neuro/Psych:   (+) CVA: residual symptoms,             GI/Hepatic/Renal:             Endo/Other:    (+) DiabetesType II DM, poorly controlled, , : arthritis:., .                 Abdominal:             Vascular:          Other Findings:           Anesthesia Plan      general     ASA 3 - emergent       Induction: intravenous.      Anesthetic plan and risks discussed with patient.                        Herbert Seta, MD   12/28/2021

## 2021-12-28 NOTE — Op Note (Signed)
Operative Note      Patient: [name] Short  Date of Birth: October 14, 1955  MRN: [id number]    Date of Procedure: 01/01/22    Pre-Op Diagnosis Codes:     * Wound of sacral region, initial encounter [S31.000A]    Post-Op Diagnosis: stage 4 sacral decubitus 10.0x15.0x4.0cm depth       Procedure(s):  SACRAL WOUND DEBRIDEMENT INCISION AND DRAINAGE    Surgeon(s):  Phineas Semen, DO    Assistant:   Surgical Assistant: Pat Patrick Phaup    Anesthesia: General    Estimated Blood Loss (mL): Minimal    Complications: None    Specimens:   ID Type Source Tests Collected by Time Destination   1 : SACRAL WOUND CULTURE Swab Sacrum CULTURE, ANAEROBIC, CULTURE, WOUND Phineas Semen, DO 01/01/22 1024        Implants:  * No implants in log *      Drains:   [REMOVED] Closed/Suction Drain Posterior;Right Neck Bulb (Removed)   Site Description Clean, dry & intact 12/11/21 0549   Dressing Status Clean, dry & intact 12/11/21 0549   Drainage Appearance Serosanguinous 12/11/21 0549   Drain Status Compressed;To bulb suction 12/11/21 0549   Output (ml) 15 ml 12/11/21 0549       [REMOVED] Urinary Catheter 12/09/21 Foley (Removed)   $ Urethral catheter insertion Inserted for procedure 12/09/21 1152   Catheter Indications Perioperative use for selected surgical procedures 12/09/21 1530   Site Assessment No urethral drainage 12/09/21 1530   Urine Color Yellow 12/09/21 1336   Urine Appearance Clear 12/09/21 1336   Collection Container Standard 12/09/21 1530   Securement Method Securing device (Describe) 12/09/21 1530   Catheter Care  Perineal wipes 12/09/21 1530   Catheter Best Practices  Drainage tube clipped to bed;Catheter secured to thigh;Tamper seal intact;Bag below bladder;Bag not on floor;Lack of dependent loop in tubing;Drainage bag less than half full 12/09/21 1530   Status Patent 12/09/21 1530   Output (mL) 425 mL 12/09/21 1846       Findings: see dictation       Detailed Description of Procedure:   After informed consent was obtained the  patient was taken to the operating room and placed in the supine position. General anesthesia was administered and titrated to effect. The patient was then placed in left lateral position. The sacrum was prepped and draped in the usual sterile fashion and a time out procedure was performed. Sharp excisional debridement of necrotic skin, subcutaneous tissue was performed down to healthy appearing sacrum and fascia. Several pus pockets were encountered. Wound cultures were obtained. The bed of the wound was hemostatic. The wound measured 10.0x14.0x4.0cm in depth. It was then irrigated with Aricept solution and packed with betadine soaked kerlex. The patient tolerated this procedure well and was sent to recovery in stable condition.     Electronically signed by Phineas Semen, DO on Jan 01, 2022 at 10:57 AM

## 2021-12-28 NOTE — Other (Signed)
TRANSFER - OUT REPORT:    Verbal report given to [place] on Lennette Fader  being transferred to [facility] for routine post-op       Report consisted of patient's Situation, Background, Assessment and   Recommendations(SBAR).     Information from the following report(s) Nurse Handoff Report and MAR was reviewed with the receiving nurse.  [place] Assessment: No data recorded  Lines:   Peripheral IV 12/26/21 Left Antecubital (Active)   Site Assessment Clean, dry & intact 12/28/21 1046   Line Status Infusing 12/28/21 1046   Line Care Connections checked and tightened 12/28/21 0800   Phlebitis Assessment No symptoms 12/28/21 1046   Infiltration Assessment 0 12/28/21 1046   Alcohol Cap Used Yes 12/28/21 0153   Dressing Status Clean, dry & intact 12/28/21 1046   Dressing Type Transparent 12/28/21 1046        Opportunity for questions and clarification was provided.      Patient transported with:  Patient Transport Bed

## 2021-12-28 NOTE — Progress Notes (Signed)
[facility] Hospitalist Group  Progress Note    Patient: [name] Short Age: 66 y.o. DOB: 09-08-1955 MR#: [id number] SSN: [id number]  Date: 12/28/2021       Subjective/24-hour events:     Seen after arriving to floor from OR.  Major complaint is that of pain but o/w stable.  Daughter present at bedside.    Assessment:   Adult failure to thrive  Sacral ulcer, POA, s/p surgical I&D 5/27  DM2 with hyperglycemia  Hypertension  Hypercalcemia    Plan:   Continue antibiotic therapy per ID, follow blood and wound cultures.  Wound care per surgery, assistance w/management appreciated.    2D echo ordered on admission - pending.    Lantus/SSI as ordered.  Adjust as necessary to optimize glycemic control in setting of wound issues.  PT/OT evaluations.  Mobility appears to have been quite limited PTA - patient is likely to need placement at discharge.  Analgesia as necessary.  Updated daughter at bedside, questions answered.    Case discussed with:  [x] Patient  [x] Family  [x]  Nursing  [] Case Management  DVT Prophylaxis:  [] Lovenox  [] Hep SQ  [] SCDs  [] Coumadin   [] On Heparin gtt [] PO anticoagulant    Objective:   VS: BP 120/68   Pulse 97   Temp 98.6 F (37 C) (Oral)   Resp 17   Ht 4\' 11"  (1.499 m)   Wt 144 lb (65.3 kg)   SpO2 96%   BMI 29.08 kg/m      Tmax/24hrs: Temp (24hrs), Avg:99.2 F (37.3 C), Min:98.2 F (36.8 C), Max:101.8 F (38.8 C)    Intake/Output Summary (Last 24 hours) at 12/28/2021 0950  Last data filed at 12/28/2021 0504  Gross per 24 hour   Intake --   Output 1000 ml   Net -1000 ml       Gen:  Appears uncomfortable but In NAD.  Lungs: Clear, no wheezes  Effort nonlabored.  CV: RRR.  Abdomen: Soft, NTTP.  Extremities: Warm, no pitting edema or ischemia.  Neuro:  Awake and alert.    Current Facility-Administered Medications   Medication Dose Route Frequency    iopamidol (ISOVUE-300) 61 % injection 80 mL  80 mL IntraVENous ONCE PRN    acetaminophen (TYLENOL) tablet 500 mg  500 mg Oral  Q4H PRN    furosemide (LASIX) tablet 40 mg  40 mg Oral Daily    sodium chloride flush 0.9 % injection 5-40 mL  5-40 mL IntraVENous 2 times per day    sodium chloride flush 0.9 % injection 5-40 mL  5-40 mL IntraVENous PRN    0.9 % sodium chloride infusion   IntraVENous PRN    potassium chloride 10 mEq/100 mL IVPB (Peripheral Line)  10 mEq IntraVENous PRN    magnesium sulfate 2000 mg in 50 mL IVPB premix  2,000 mg IntraVENous PRN    ondansetron (ZOFRAN-ODT) disintegrating tablet 4 mg  4 mg Oral Q8H PRN    Or    ondansetron (ZOFRAN) injection 4 mg  4 mg IntraVENous Q6H PRN    polyethylene glycol (GLYCOLAX) packet 17 g  17 g Oral Daily PRN    acetaminophen (TYLENOL) suppository 650 mg  650 mg Rectal Q6H PRN    lactated ringers IV soln infusion   IntraVENous Continuous    sodium hypochlorite (DAKINS) 0.125 % external solution   Irrigation BID    piperacillin-tazobactam (ZOSYN) 3,375 mg in sodium chloride 0.9 % 50 mL IVPB (mini-bag)  3,375 mg IntraVENous Q8H  glucose chewable tablet 16 g  4 tablet Oral PRN    dextrose bolus 10% 125 mL  125 mL IntraVENous PRN    Or    dextrose bolus 10% 250 mL  250 mL IntraVENous PRN    glucagon (rDNA) injection 1 mg  1 mg SubCUTAneous PRN    dextrose 10 % infusion   IntraVENous Continuous PRN    insulin lispro (HUMALOG) injection vial 0-8 Units  0-8 Units SubCUTAneous TID WC    insulin lispro (HUMALOG) injection vial 0-4 Units  0-4 Units SubCUTAneous Nightly    vancomycin (VANCOCIN) 750 mg in sodium chloride 0.9 % 250 mL IVPB (vial-mate)  750 mg IntraVENous Q12H        Labs:    Recent Results (from the past 24 hour(s))   POCT Glucose    Collection Time: 12/27/21 10:26 AM   Result Value Ref Range    POC Glucose 192 (H) 70 - 110 mg/dL   POCT Glucose    Collection Time: 12/27/21  7:32 PM   Result Value Ref Range    POC Glucose 185 (H) 70 - 110 mg/dL   POCT Glucose    Collection Time: 12/27/21  9:20 PM   Result Value Ref Range    POC Glucose 298 (H) 70 - 110 mg/dL   Troponin    Collection  Time: 12/27/21 11:26 PM   Result Value Ref Range    Troponin, High Sensitivity 11 0 - 54 ng/L   Troponin    Collection Time: 12/28/21  4:32 AM   Result Value Ref Range    Troponin, High Sensitivity 10 0 - 54 ng/L   CBC with Auto Differential    Collection Time: 12/28/21  4:32 AM   Result Value Ref Range    WBC 10.4 4.6 - 13.2 K/uL    RBC 3.44 (L) 4.20 - 5.30 M/uL    Hemoglobin 8.5 (L) 12.0 - 16.0 g/dL    Hematocrit 79.4 (L) 35.0 - 45.0 %    MCV 78.8 78.0 - 100.0 FL    MCH 24.7 24.0 - 34.0 PG    MCHC 31.4 31.0 - 37.0 g/dL    RDW 80.1 (H) 65.5 - 14.5 %    Platelets 339 135 - 420 K/uL    MPV 9.7 9.2 - 11.8 FL    Nucleated RBCs 0.0 0 PER 100 WBC    nRBC 0.00 0.00 - 0.01 K/uL    Neutrophils % 82 (H) 40 - 73 %    Lymphocytes % 6 (L) 21 - 52 %    Monocytes % 11 (H) 3 - 10 %    Eosinophils % 1 0 - 5 %    Basophils % 0 0 - 2 %    Immature Granulocytes 1 (H) 0.0 - 0.5 %    Neutrophils Absolute 8.5 (H) 1.8 - 8.0 K/UL    Lymphocytes Absolute 0.6 (L) 0.9 - 3.6 K/UL    Monocytes Absolute 1.1 0.05 - 1.2 K/UL    Eosinophils Absolute 0.1 0.0 - 0.4 K/UL    Basophils Absolute 0.0 0.0 - 0.1 K/UL    Absolute Immature Granulocyte 0.1 (H) 0.00 - 0.04 K/UL    Differential Type AUTOMATED     Culture, Blood 1    Collection Time: 12/28/21  4:32 AM    Specimen: Blood   Result Value Ref Range    Special Requests NO SPECIAL REQUESTS  LEFT  HAND        Culture NO GROWTH AFTER  1 HOUR     Culture, Blood 2    Collection Time: 12/28/21  4:35 AM    Specimen: Blood   Result Value Ref Range    Special Requests NO SPECIAL REQUESTS  RIGHT  HAND        Culture NO GROWTH AFTER 1 HOUR     PTH, Intact    Collection Time: 12/28/21  5:45 AM   Result Value Ref Range    Calcium 10.6 (H) 8.5 - 10.1 MG/DL    Pth Intact 045.4120.7 (H) 18.4 - 88.0 pg/mL   POCT Glucose    Collection Time: 12/28/21  8:00 AM   Result Value Ref Range    POC Glucose 280 (H) 70 - 110 mg/dL         Signed By: Gracy BruinsArmando Zabian Swayne, MD     Dec 28, 2021

## 2021-12-28 NOTE — Other (Signed)
Dr. Nicoletta Dress aware pt's B/S at  0800 on floor was 280 and patient received 4u humalog insulin at 0900, per dr Nicoletta Dress no additional insulin pre-op.

## 2021-12-29 LAB — CBC
Hematocrit: 25.9 % — ABNORMAL LOW (ref 35.0–45.0)
Hemoglobin: 8.1 g/dL — ABNORMAL LOW (ref 12.0–16.0)
MCH: 24.8 pg (ref 24.0–34.0)
MCHC: 31.3 g/dL (ref 31.0–37.0)
MCV: 79.2 FL (ref 78.0–100.0)
MPV: 9.9 FL (ref 9.2–11.8)
Nucleated RBCs: 0 /100{WBCs}
Platelets: 334 10*3/uL (ref 135–420)
RBC: 3.27 M/uL — ABNORMAL LOW (ref 4.20–5.30)
RDW: 15.6 % — ABNORMAL HIGH (ref 11.6–14.5)
WBC: 7.9 10*3/uL (ref 4.6–13.2)
nRBC: 0 10*3/uL (ref 0.00–0.01)

## 2021-12-29 LAB — COMPREHENSIVE METABOLIC PANEL
ALT: 21 U/L (ref 13–56)
AST: 11 U/L (ref 10–38)
Albumin/Globulin Ratio: 0.6 — ABNORMAL LOW (ref 0.8–1.7)
Albumin: 2 g/dL — ABNORMAL LOW (ref 3.4–5.0)
Alk Phosphatase: 87 U/L (ref 45–117)
Anion Gap: 4 mmol/L (ref 3.0–18)
BUN/Creatinine Ratio: 24 — ABNORMAL HIGH (ref 12–20)
BUN: 18 mg/dL (ref 7.0–18)
CO2: 28 mmol/L (ref 21–32)
Calcium: 10.2 mg/dL — ABNORMAL HIGH (ref 8.5–10.1)
Chloride: 109 mmol/L (ref 100–111)
Creatinine: 0.74 mg/dL (ref 0.6–1.3)
Est, Glom Filt Rate: >60 mL/min/{1.73_m2} (ref >60)
Globulin: 3.5 g/dL (ref 2.0–4.0)
Glucose: 186 mg/dL — ABNORMAL HIGH (ref 74–99)
Potassium: 3.3 mmol/L — ABNORMAL LOW (ref 3.5–5.5)
Sodium: 141 mmol/L (ref 136–145)
Total Bilirubin: 0.4 mg/dL (ref 0.2–1.0)
Total Protein: 5.5 g/dL — ABNORMAL LOW (ref 6.4–8.2)

## 2021-12-29 LAB — VANCOMYCIN LEVEL, RANDOM: Vancomycin Rm: 19.1 ug/mL (ref 5.0–40.0)

## 2021-12-29 LAB — POCT GLUCOSE
POC Glucose: 321 mg/dL — ABNORMAL HIGH (ref 70–110)
POC Glucose: 197 mg/dL — ABNORMAL HIGH (ref 70–110)
POC Glucose: 273 mg/dL — ABNORMAL HIGH (ref 70–110)
POC Glucose: 362 mg/dL — ABNORMAL HIGH (ref 70–110)

## 2021-12-29 MED ORDER — POTASSIUM BICARB-CITRIC ACID 20 MEQ PO TBEF
20 MEQ | ORAL | Status: AC
Start: 2021-12-29 — End: 2021-12-29
  Administered 2021-12-29 – 2021-12-30 (×3): 40 meq via ORAL

## 2021-12-29 MED ORDER — INSULIN GLARGINE 100 UNIT/ML SC SOLN
100 UNIT/ML | Freq: Every day | SUBCUTANEOUS | Status: DC
Start: 2021-12-29 — End: 2021-12-30
  Administered 2021-12-30: 12:00:00 15 [IU] via SUBCUTANEOUS

## 2021-12-29 MED FILL — HUMALOG 100 UNIT/ML IJ SOLN: 100 UNIT/ML | INTRAMUSCULAR | Qty: 1

## 2021-12-29 MED FILL — VANCOMYCIN HCL 750 MG IV SOLR: 750 MG | INTRAVENOUS | Qty: 750

## 2021-12-29 MED FILL — LANTUS 100 UNIT/ML SC SOLN: 100 UNIT/ML | SUBCUTANEOUS | Qty: 1

## 2021-12-29 MED FILL — EFFER-K 20 MEQ PO TBEF: 20 MEQ | ORAL | Qty: 2

## 2021-12-29 MED FILL — PIPERACILLIN SOD-TAZOBACTAM SO 3.375 (3-0.375) G IV SOLR: 3.375 (3-0.375) g | INTRAVENOUS | Qty: 3375

## 2021-12-29 MED FILL — HYDROCODONE-ACETAMINOPHEN 5-325 MG PO TABS: 5-325 MG | ORAL | Qty: 1

## 2021-12-29 MED FILL — FUROSEMIDE 40 MG PO TABS: 40 MG | ORAL | Qty: 1

## 2021-12-29 NOTE — Plan of Care (Signed)
Problem: Safety - Adult  Goal: Free from fall injury  Outcome: Progressing     Problem: Skin/Tissue Integrity  Goal: Absence of new skin breakdown  Description: 1.  Monitor for areas of redness and/or skin breakdown  2.  Assess vascular access sites hourly  3.  Every 4-6 hours minimum:  Change oxygen saturation probe site  4.  Every 4-6 hours:  If on nasal continuous positive airway pressure, respiratory therapy assess nares and determine need for appliance change or resting period.  Outcome: Progressing     Problem: ABCDS Injury Assessment  Goal: Absence of physical injury  Outcome: Progressing     Problem: Pain  Goal: Verbalizes/displays adequate comfort level or baseline comfort level  Outcome: Progressing     Problem: Nutrition Deficit:  Goal: Optimize nutritional status  Outcome: Progressing     Problem: Neurosensory - Adult  Goal: Absence of seizures  Outcome: Progressing     Problem: Skin/Tissue Integrity - Adult  Goal: Incisions, wounds, or drain sites healing without S/S of infection  Outcome: Progressing  Flowsheets (Taken 12/29/2021 0732)  Incisions, Wounds, or Drain Sites Healing Without Sign and Symptoms of Infection:   TWICE DAILY: Assess and document skin integrity   Implement wound care per orders   Initiate pressure ulcer prevention bundle as indicated  Goal: Oral mucous membranes remain intact  Outcome: Progressing  Flowsheets (Taken 12/29/2021 0732)  Oral Mucous Membranes Remain Intact:   Implement preventative oral hygiene regimen   Assess oral mucosa and hygiene practices     Problem: Musculoskeletal - Adult  Goal: Return mobility to safest level of function  Outcome: Progressing  Flowsheets (Taken 12/29/2021 0732)  Return Mobility to Safest Level of Function:   Assess patient stability and activity tolerance for standing, transferring and ambulating with or without assistive devices   Ensure adequate protection for wounds/incisions during mobilization  Goal: Return ADL status to a safe level of  function  Recent Flowsheet Documentation  Taken 12/29/2021 0732 by Nathen May, RN  Return ADL Status to a Safe Level of Function:   Assess activities of daily living deficits and provide assistive devices as needed   Administer medication as ordered     Problem: Infection - Adult  Goal: Absence of infection at discharge  Outcome: Progressing

## 2021-12-29 NOTE — Progress Notes (Addendum)
[facility] Hospitalist Group  Progress Note    Patient: [name] Short Age: 66 y.o. DOB: [birth year] MR#: [id number] SSN: [id number]  Date: 12/29/2021       Subjective/24-hour events:     Pain under better control today.  No new issues overnight.  Daughter present at bedside.    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  Continue wound care per surgery.  Neurology evaluation per request of daughter.  No obvious evidence for any acute neurologic insults.  Dr. Chancy Milroy to see, recommendations appreciated in advance.  Increase Lantus some today.  SSI as ordered.  Replace potassium today.  Monitor.  PT/OT evals.  Disposition to be determined.  Daughter hopeful that patient may be a candidate for inpatient rehab.  Continue analgesia as necessary, bowel regimen as needed.  Continue supportive care otherwise.  Follow  Plan discussed with daughter at bedside, questions answered.    Case discussed with:  [x]Patient  [x]Family  [x] Nursing  []Case Management  DVT Prophylaxis:  []Lovenox  []Hep SQ  [x]SCDs  []Coumadin   []On Heparin gtt []PO anticoagulant    Objective:   VS: BP (!) 120/55   Pulse 87   Temp 98.4 F (36.9 C) (Axillary)   Resp 18   Ht 4' 11" (1.499 m)   Wt 144 lb (65.3 kg)   SpO2 96%   BMI 29.08 kg/m      Tmax/24hrs: Temp (24hrs), Avg:98.6 F (37 C), Min:97.8 F (36.6 C), Max:99.5 F (37.5 C)    Intake/Output Summary (Last 24 hours) at 12/29/2021 1354  Last data filed at 12/29/2021 0459  Gross per 24 hour   Intake 1501.25 ml   Output 1925 ml   Net -423.75 ml       Gen:  Appears uncomfortable but In NAD.  Lungs: Clear, no wheezes  Effort nonlabored.  CV: RRR.  Abdomen: Soft, NTTP.  Extremities: Warm, no pitting edema or  ischemia.  Neuro:  Awake and alert.    Current Facility-Administered Medications   Medication Dose Route Frequency    potassium bicarb-citric acid (EFFER-K) effervescent tablet 40 mEq  40 mEq Oral Q4H    insulin glargine (LANTUS) injection vial 10 Units  10 Units SubCUTAneous Daily    lactated ringers IV soln infusion   IntraVENous Continuous    HYDROcodone-acetaminophen (NORCO) 5-325 MG per tablet 1 tablet  1 tablet Oral Q4H PRN    acetaminophen (TYLENOL) tablet 500 mg  500 mg Oral Q4H PRN    furosemide (LASIX) tablet 40 mg  40 mg Oral Daily    sodium chloride flush 0.9 % injection 5-40 mL  5-40 mL IntraVENous 2 times per day    sodium chloride flush 0.9 % injection 5-40 mL  5-40 mL IntraVENous PRN    0.9 % sodium chloride infusion   IntraVENous PRN    potassium chloride 10 mEq/100 mL IVPB (Peripheral Line)  10 mEq IntraVENous PRN    magnesium sulfate 2000 mg in 50 mL IVPB premix  2,000 mg IntraVENous PRN    ondansetron (ZOFRAN-ODT) disintegrating tablet 4 mg  4 mg Oral Q8H PRN    Or    ondansetron (ZOFRAN) injection 4 mg  4 mg IntraVENous Q6H PRN    polyethylene  glycol (GLYCOLAX) packet 17 g  17 g Oral Daily PRN    acetaminophen (TYLENOL) suppository 650 mg  650 mg Rectal Q6H PRN    sodium hypochlorite (DAKINS) 0.125 % external solution   Irrigation BID    piperacillin-tazobactam (ZOSYN) 3,375 mg in sodium chloride 0.9 % 50 mL IVPB (mini-bag)  3,375 mg IntraVENous Q8H    glucose chewable tablet 16 g  4 tablet Oral PRN    dextrose bolus 10% 125 mL  125 mL IntraVENous PRN    Or    dextrose bolus 10% 250 mL  250 mL IntraVENous PRN    glucagon (rDNA) injection 1 mg  1 mg SubCUTAneous PRN    dextrose 10 % infusion   IntraVENous Continuous PRN    insulin lispro (HUMALOG) injection vial 0-8 Units  0-8 Units SubCUTAneous TID WC    insulin lispro (HUMALOG) injection vial 0-4 Units  0-4 Units SubCUTAneous Nightly    vancomycin (VANCOCIN) 750 mg in sodium chloride 0.9 % 250 mL IVPB (vial-mate)  750 mg IntraVENous Q12H         Labs:    Recent Results (from the past 24 hour(s))   POCT Glucose    Collection Time: 12/28/21  4:06 PM   Result Value Ref Range    POC Glucose 218 (H) 70 - 110 mg/dL   POCT Glucose    Collection Time: 12/28/21  9:09 PM   Result Value Ref Range    POC Glucose 321 (H) 70 - 110 mg/dL   Vancomycin Level, Random    Collection Time: 12/29/21  3:30 AM   Result Value Ref Range    Vancomycin Rm 19.1 5.0 - 40.0 UG/ML   Comprehensive Metabolic Panel    Collection Time: 12/29/21  3:30 AM   Result Value Ref Range    Sodium 141 136 - 145 mmol/L    Potassium 3.3 (L) 3.5 - 5.5 mmol/L    Chloride 109 100 - 111 mmol/L    CO2 28 21 - 32 mmol/L    Anion Gap 4 3.0 - 18 mmol/L    Glucose 186 (H) 74 - 99 mg/dL    BUN 18 7.0 - 18 MG/DL    Creatinine 0.74 0.6 - 1.3 MG/DL    Bun/Cre Ratio 24 (H) 12 - 20      Est, Glom Filt Rate >60 >60 ml/min/1.78m    Calcium 10.2 (H) 8.5 - 10.1 MG/DL    Total Bilirubin 0.4 0.2 - 1.0 MG/DL    ALT 21 13 - 56 U/L    AST 11 10 - 38 U/L    Alk Phosphatase 87 45 - 117 U/L    Total Protein 5.5 (L) 6.4 - 8.2 g/dL    Albumin 2.0 (L) 3.4 - 5.0 g/dL    Globulin 3.5 2.0 - 4.0 g/dL    Albumin/Globulin Ratio 0.6 (L) 0.8 - 1.7     CBC    Collection Time: 12/29/21  3:30 AM   Result Value Ref Range    WBC 7.9 4.6 - 13.2 K/uL    RBC 3.27 (L) 4.20 - 5.30 M/uL    Hemoglobin 8.1 (L) 12.0 - 16.0 g/dL    Hematocrit 25.9 (L) 35.0 - 45.0 %    MCV 79.2 78.0 - 100.0 FL    MCH 24.8 24.0 - 34.0 PG    MCHC 31.3 31.0 - 37.0 g/dL    RDW 15.6 (H) 11.6 - 14.5 %    Platelets 334 135 - 420 K/uL  MPV 9.9 9.2 - 11.8 FL    Nucleated RBCs 0.0 0 PER 100 WBC    nRBC 0.00 0.00 - 0.01 K/uL   POCT Glucose    Collection Time: 12/29/21  8:00 AM   Result Value Ref Range    POC Glucose 197 (H) 70 - 110 mg/dL   POCT Glucose    Collection Time: 12/29/21 11:16 AM   Result Value Ref Range    POC Glucose 273 (H) 70 - 110 mg/dL         Signed By: Daneil Dolin, MD     Dec 29, 2021

## 2021-12-29 NOTE — Progress Notes (Signed)
OT order received and chart reviewed. Pt soundly sleeping, however, opened eyes when OT walked in. Explained OT eval and pt requested to come back tomorrow.     Will f/u later as schedule allows. Thank you.  Corita Allinson OTD, OTR/L

## 2021-12-29 NOTE — Consults (Signed)
HISTORY AND EXAM   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 The patient was not able to stand up and has had a recent procedure(cervical decompression and fusion 2 weeks ago) and was evaluated for these symptoms by her spine surgeon Dr. Carney Living, he does not think her issues are related to her recent surgery per his note from today. The daughter notes the patient has been getting progressively weaker over months. Sh went from a cane to a walker in 6 months. No nausea or vomiting, no diarrhea or constipation per the patient's daughter  MRI C spine showed post cervical change, chronic DDD in the thoracic and lumbar spine, Dr. Carney Living evaluated the MRI of those aforementioned regions and did not think any findings were contributing to her symptoms at this time.   Daughter report patient have memory issues and forgetful last six months, patient admit taking her aspirin regularly before.  Patient report hand lower lower extremity paresthesias, neck pain, follow simple commands appropriately answers questions.       Social History     Socioeconomic History    Marital status: Single     Spouse name: Not on file    Number of children: Not on file    Years of education: Not on file    Highest education level: Not on file   Occupational History    Not on file   Tobacco Use    Smoking status: Every Day     Packs/day: 1.00     Types: Cigarettes, Cigars     Last attempt to quit: 08/2019     Years since quitting: 2.4    Smokeless tobacco: Never   Vaping Use    Vaping Use: Never used   Substance and Sexual Activity    Alcohol use: Yes     Comment: occ    Drug use: Never    Sexual activity: Not on file   Other  Topics Concern    Not on file   Social History Narrative    Not on file     Social Determinants of Health     Financial Resource Strain: Not on file   Food Insecurity: Not on file   Transportation Needs: Not on file   Physical Activity: Not on file   Stress: Not on file   Social Connections: Not on file   Intimate Partner Violence: Not on file   Housing Stability: Not on file       Family History   Problem Relation Age of Onset    Diabetes Other 45        parent, NOS    Cancer Father     Hypertension Other 35        parent,NOS    Heart Disease Other 37        parent, NOS    Lung Disease Father         Current Facility-Administered Medications   Medication Dose Route Frequency Provider Last Rate Last Admin    potassium bicarb-citric acid (EFFER-K) effervescent tablet 40 mEq  40 mEq Oral Q4H Areatha Keas, MD   40 mEq at 12/29/21 1623    [  START ON 12/30/2021] insulin glargine (LANTUS) injection vial 15 Units  15 Units SubCUTAneous Daily Areatha KeasArmando J Wyatt, MD        lactated ringers IV soln infusion   IntraVENous Continuous Mei-Chun Tillie RungA Yuen, APRN - CRNA   Stopped at 12/28/21 2152    HYDROcodone-acetaminophen (NORCO) 5-325 MG per tablet 1 tablet  1 tablet Oral Q4H PRN Areatha KeasArmando J Wyatt, MD   1 tablet at 12/29/21 1135    acetaminophen (TYLENOL) tablet 500 mg  500 mg Oral Q4H PRN Ferdie PingMatthew L Klug, DO   500 mg at 12/27/21 2140    furosemide (LASIX) tablet 40 mg  40 mg Oral Daily Ferdie PingMatthew L Klug, DO   40 mg at 12/29/21 0913    sodium chloride flush 0.9 % injection 5-40 mL  5-40 mL IntraVENous 2 times per day Ferdie PingMatthew L Klug, DO   10 mL at 12/29/21 [id number]    sodium chloride flush 0.9 % injection 5-40 mL  5-40 mL IntraVENous PRN Ferdie PingMatthew L Klug, DO        0.9 % sodium chloride infusion   IntraVENous PRN Ferdie PingMatthew L Klug, DO 50 mL/hr at 12/28/21 1003 Restarted at 12/28/21 1033    potassium chloride 10 mEq/100 mL IVPB (Peripheral Line)  10 mEq IntraVENous PRN Ferdie PingMatthew L Klug, DO 100 mL/hr at 12/27/21 2307 10 mEq at 12/27/21 2307    magnesium  sulfate 2000 mg in 50 mL IVPB premix  2,000 mg IntraVENous PRN Ferdie PingMatthew L Klug, DO        ondansetron (ZOFRAN-ODT) disintegrating tablet 4 mg  4 mg Oral Q8H PRN Ferdie PingMatthew L Klug, DO        Or    ondansetron [facility](ZOFRAN) injection 4 mg  4 mg IntraVENous Q6H PRN Ferdie PingMatthew L Klug, DO        polyethylene glycol (GLYCOLAX) packet 17 g  17 g Oral Daily PRN Ferdie PingMatthew L Klug, DO        acetaminophen (TYLENOL) suppository 650 mg  650 mg Rectal Q6H PRN Ferdie PingMatthew L Klug, DO        sodium hypochlorite (DAKINS) 0.125 % external solution   Irrigation BID Ferdie PingMatthew L Klug, DO   Given at 12/27/21 2353    piperacillin-tazobactam (ZOSYN) 3,375 mg in sodium chloride 0.9 % 50 mL IVPB (mini-bag)  3,375 mg IntraVENous Q8H Ferdie PingMatthew L Klug, DO   Stopped at 12/29/21 1800    glucose chewable tablet 16 g  4 tablet Oral PRN Ferdie PingMatthew L Klug, DO        dextrose bolus 10% 125 mL  125 mL IntraVENous PRN Ferdie PingMatthew L Klug, DO        Or    dextrose bolus 10% 250 mL  250 mL IntraVENous PRN Ferdie PingMatthew L Klug, DO        glucagon (rDNA) injection 1 mg  1 mg SubCUTAneous PRN Ferdie PingMatthew L Klug, DO        dextrose 10 % infusion   IntraVENous Continuous PRN Ferdie PingMatthew L Klug, DO        insulin lispro (HUMALOG) injection vial 0-8 Units  0-8 Units SubCUTAneous TID WC Ferdie PingMatthew L Klug, DO   8 Units at 12/29/21 1625    insulin lispro (HUMALOG) injection vial 0-4 Units  0-4 Units SubCUTAneous Nightly Ferdie PingMatthew L Klug, DO   4 Units at 12/28/21 2149    vancomycin (VANCOCIN) 750 mg in sodium chloride 0.9 % 250 mL IVPB (vial-mate)  750 mg IntraVENous Q12H Collier BullockLindsey K Reprogle, PA   Stopped at 12/29/21 1230  Past Medical History:   Diagnosis Date    Anemia     Arthritis     Chronic pain     Legs and Shoulders    Diabetes (HCC)     Exposure to asbestos     History of blood transfusion     History of colon polyps     HTN (hypertension)     Hx of blood clots     DVT    Hyperlipemia     Menopause     Pulmonary nodule     Serum calcium elevated     Sleep apnea     not using cpap    Stroke [facility])         Past Surgical History:   Procedure Laterality Date    CESAREAN SECTION  1987    COLONOSCOPY N/A 01/13/2017    COLONOSCOPY performed by Levan Hurst, MD at [facility] ENDOSCOPY    COLONOSCOPY      DILATION AND CURETTAGE OF UTERUS  1995    SPINE SURGERY N/A 12/09/2021    CERVICAL THREE/FOUR/FIVE/SIX LAMINECTOMY FUSION; C-ARM; STRYKER; EXT BONE GROWTH STIM; 23 HR performed by Pamalee Leyden, MD at [facility] MAIN OR       Allergies   Allergen Reactions    Lactose Other (See Comments)     Intolerance-Pt denies       Patient Active Problem List   Diagnosis    Iron deficiency anemia due to chronic blood loss    Diabetes (HCC)    Occult blood in stools    Stroke (HCC)    Symptomatic anemia    Cervical myelopathy (HCC)    Obstructive sleep apnea syndrome    Spinal stenosis of lumbar region    Syncope    Cerebrovascular accident [facility])    Fall    Failure to thrive in adult    Skin ulcer of sacrum (HCC)    RUQ pain    Decubitus ulcer of sacral region, stage 4 (HCC)         Review of Systems:   As above       PHYSICAL EXAMINATION:      VITAL SIGNS:  BP 115/61   Pulse 83   Temp 98.1 F (36.7 C) (Oral)   Resp 20   Ht  (1.499 m)   Wt 144 lb (65.3 kg)   SpO2 97%   BMI 29.08 kg/m     GENERAL: The patient is well developed, well nourished, and in no apparent distress. Marland Kitchen  HEAD:   Ear, nose, and throat appear to be without trauma.  The patient is normocephalic.    NEUROLOGIC EXAMINATION  Mental status: Awake, alert, oriented x3, follows simple,   CN: VFF, EOMI, PERRLA, face sensation intact , no facial asymmetry noted,tongue midline  Motor: Move upper extremity against gravity but poor effort, left lower extremity 2/5 , AT and GA -4/5, right side able to move some extent but not fully against gravity, AT and GA -4/5, increased lower extremity tone, no clonus,   Left GA atrophy.  Sensory: Grossly intact  Coordination: FNF accurate w/o dysmetria  DTR: 2+ throughout, Plantar withdrawal  Gait: not tested     Impression:   66 y.o.  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 The patient was not able to stand up and has had a recent procedure(cervical decompression and fusion 2 weeks ago) and was evaluated for these symptoms by her spine surgeon Dr. Carney Living, he does not think her issues are related to her recent surgery per his note from today. The daughter notes the patient has been getting progressively weaker over months. Sh went from a cane to a walker in 6 months. No nausea or vomiting, no diarrhea or constipation per the patient's daughter  MRI C spine showed post cervical change, chronic DDD in the thoracic and lumbar spine, Dr. Carney Living evaluated the MRI of those aforementioned regions and did not think any findings were contributing to her symptoms at this time.   Daughter report patient have memory issues and forgetful last six months, patient admit taking her aspirin regularly before.  Patient report hand lower lower extremity paresthesias, neck pain, follow simple commands appropriately answers questions.    VASCULAR/ CERVICAL MYELOPATHY.  Rule out stroke.  RECOMMEND  MRI BRAIN, MRA HEAD AND NECK to rule out stroke.  Worsening lower extremity weakness to rule out new stroke.  Orthopedic did not think related to spine.  Maximize control of risk factors.  Aspirin and statins.  DVT prophylaxis.  PT AND OT.    History of sleep apnea- compliance with machine.risk factor.  Memory problems , vascular dementia ? Mri in 55 showed old stroke and hippocampal volume loss.  Check B12, neuropsychological  evaluation out patient    Plan: As above    I spent 25 minutes with the patient in face-to-face consultation, of which greater than 50%  was spent in counseling and coordination of care as described above.    PLEASE NOTE:   This document has been produced using voice recognition software. Unrecognized errors in transcription may be present.

## 2021-12-29 NOTE — Progress Notes (Addendum)
Dr. Rosine Abe was notified of patients BS being 58 tonight and that she had received 4 units of Lispro. Will wait one hour to recheck patients BS level and page him again with the results.

## 2021-12-29 NOTE — Progress Notes (Signed)
General surgery:    POD 1 s/p debridement of sacral decubitus    No acute events overnight    D/w patient and daughter nursing will perform Dakins dressing BID today. Tomorrow a wound vac can be placed over the sacrum and changed 3 times a week. If contamination of the wound from stool does not become a problem there is no need for diverting colostomy.    Emphasized high protein diet, off loading    Recommend physical therapy as she is severely deconditioned      Phineas Semen, DO

## 2021-12-30 ENCOUNTER — Inpatient Hospital Stay: Admit: 2021-12-30 | Payer: PRIVATE HEALTH INSURANCE | Primary: Physician Assistant

## 2021-12-30 ENCOUNTER — Encounter: Payer: PRIVATE HEALTH INSURANCE | Primary: Registered Nurse

## 2021-12-30 LAB — BASIC METABOLIC PANEL
Anion Gap: 5 mmol/L (ref 3.0–18)
BUN/Creatinine Ratio: 27 — ABNORMAL HIGH (ref 12–20)
BUN: 26 mg/dL — ABNORMAL HIGH (ref 7.0–18)
CO2: 30 mmol/L (ref 21–32)
Calcium: 10.1 mg/dL (ref 8.5–10.1)
Chloride: 104 mmol/L (ref 100–111)
Creatinine: 0.96 mg/dL (ref 0.6–1.3)
Est, Glom Filt Rate: >60 mL/min/{1.73_m2} (ref >60)
Glucose: 296 mg/dL — ABNORMAL HIGH (ref 74–99)
Potassium: 4.7 mmol/L (ref 3.5–5.5)
Sodium: 139 mmol/L (ref 136–145)

## 2021-12-30 LAB — VITAMIN B12: Vitamin B-12: 477 pg/mL (ref 211–911)

## 2021-12-30 LAB — CULTURE, ANAEROBIC

## 2021-12-30 LAB — POCT GLUCOSE
POC Glucose: 372 mg/dL — ABNORMAL HIGH (ref 70–110)
POC Glucose: 283 mg/dL — ABNORMAL HIGH (ref 70–110)
POC Glucose: 253 mg/dL — ABNORMAL HIGH (ref 70–110)
POC Glucose: 369 mg/dL — ABNORMAL HIGH (ref 70–110)
POC Glucose: 387 mg/dL — ABNORMAL HIGH (ref 70–110)

## 2021-12-30 MED ORDER — ASPIRIN 81 MG PO CHEW
81 MG | Freq: Every day | ORAL | Status: AC
Start: 2021-12-30 — End: 2022-01-06
  Administered 2021-12-30 – 2022-01-06 (×8): 81 mg via ORAL

## 2021-12-30 MED ORDER — INSULIN GLARGINE 100 UNIT/ML SC SOLN
100 UNIT/ML | Freq: Every day | SUBCUTANEOUS | Status: AC
Start: 2021-12-30 — End: 2022-01-02
  Administered 2021-12-31 – 2022-01-02 (×3): 30 [IU] via SUBCUTANEOUS

## 2021-12-30 MED ORDER — INSULIN GLARGINE 100 UNIT/ML SC SOLN
100 UNIT/ML | Freq: Once | SUBCUTANEOUS | Status: AC
Start: 2021-12-30 — End: 2021-12-30
  Administered 2021-12-31: 01:00:00 15 [IU] via SUBCUTANEOUS

## 2021-12-30 MED ORDER — ATORVASTATIN CALCIUM 40 MG PO TABS
40 MG | Freq: Every evening | ORAL | Status: AC
Start: 2021-12-30 — End: 2022-01-06
  Administered 2022-01-01 – 2022-01-06 (×6): 80 mg via ORAL

## 2021-12-30 MED ORDER — ATORVASTATIN CALCIUM 40 MG PO TABS
40 MG | Freq: Every evening | ORAL | Status: DC
Start: 2021-12-30 — End: 2021-12-30

## 2021-12-30 MED ORDER — ACETAMINOPHEN 500 MG PO TABS
500 | ORAL | Status: DC | PRN
Start: 2021-12-30 — End: 2022-01-06

## 2021-12-30 MED FILL — VANCOMYCIN HCL 750 MG IV SOLR: 750 MG | INTRAVENOUS | Qty: 750

## 2021-12-30 MED FILL — HUMALOG 100 UNIT/ML IJ SOLN: 100 UNIT/ML | INTRAMUSCULAR | Qty: 1

## 2021-12-30 MED FILL — LANTUS 100 UNIT/ML SC SOLN: 100 UNIT/ML | SUBCUTANEOUS | Qty: 1

## 2021-12-30 MED FILL — PIPERACILLIN SOD-TAZOBACTAM SO 3.375 (3-0.375) G IV SOLR: 3.375 (3-0.375) g | INTRAVENOUS | Qty: 3375

## 2021-12-30 MED FILL — HYDROCODONE-ACETAMINOPHEN 5-325 MG PO TABS: 5-325 MG | ORAL | Qty: 1

## 2021-12-30 MED FILL — ASPIRIN LOW DOSE 81 MG PO CHEW: 81 MG | ORAL | Qty: 1

## 2021-12-30 MED FILL — EFFER-K 20 MEQ PO TBEF: 20 MEQ | ORAL | Qty: 2

## 2021-12-30 MED FILL — FUROSEMIDE 40 MG PO TABS: 40 MG | ORAL | Qty: 1

## 2021-12-30 NOTE — Progress Notes (Signed)
[facility] Hospitalist [facility]  Progress Note    Patient: [name] Short Age: 66 y.o. DOB: 19-Oct-1955 MR#: [id number] SSN: [id number]  Date: 12/30/2021       Subjective/24-hour events:     Comfortable currently, no new issues overnight.    Assessment:   Adult failure to thrive  Sacral ulcer, POA, s/p surgical I&D 5/27  DM 2 with hyperglycemia  Hypertension  Hypercalcemia  Cervical spondylosis with myelopathy status post laminectomy and fusion of C3-C6 and external bone growth stimulator placement 12/09/2021    Plan:   Zosyn and vancomycin per ID recommendations.  Wound care per surgery.  Wound VAC to be placed.  Neurology recs appreciated.  MRI brain, MRA neck ordered, await result.  ASA/statin added.  Lantus increased starting this AM.  Continue SSI.  Further adjustment as necessary based on glycemic control going forward.  Potassium in normal range today.  Monitor and replace further as necessary.  Pain control, bowel regimen.  PT/OT as tolerated.  Anticipate need for placement at discharge.  Daughter would prefer ARU if deemed a candidate.  Continue supportive care otherwise.      Case discussed with:  [x] Patient  [x] Family  [x]  Nursing  [] Case Management  DVT Prophylaxis:  [] Lovenox  [] Hep SQ  [x] SCDs  [] Coumadin   [] On Heparin gtt [] PO anticoagulant    Objective:   VS: BP (!) 117/56   Pulse 77   Temp 97.2 F (36.2 C) (Oral)   Resp 20   Ht 4\' 11"  (1.499 m)   Wt 144 lb (65.3 kg)   SpO2 97%   BMI 29.08 kg/m      Tmax/24hrs: Temp (24hrs), Avg:98.2 F (36.8 C), Min:97.2 F (36.2 C), Max:98.8 F (37.1 C)    Intake/Output Summary (Last 24 hours) at 12/30/2021 0948  Last data filed at 12/30/2021 0936  Gross per 24 hour   Intake 950 ml   Output 2400 ml   Net -1450 ml       Gen:  Appears uncomfortable but In NAD.  Lungs: Clear, no wheezes  Effort nonlabored.  CV: RRR.  Abdomen: Soft, NTTP.  Extremities: Warm, no pitting edema or ischemia.  Neuro:  Awake and alert.    Current  Facility-Administered Medications   Medication Dose Route Frequency    insulin glargine (LANTUS) injection vial 15 Units  15 Units SubCUTAneous Daily    lactated ringers IV soln infusion   IntraVENous Continuous    HYDROcodone-acetaminophen (NORCO) 5-325 MG per tablet 1 tablet  1 tablet Oral Q4H PRN    acetaminophen (TYLENOL) tablet 500 mg  500 mg Oral Q4H PRN    furosemide (LASIX) tablet 40 mg  40 mg Oral Daily    sodium chloride flush 0.9 % injection 5-40 mL  5-40 mL IntraVENous 2 times per day    sodium chloride flush 0.9 % injection 5-40 mL  5-40 mL IntraVENous PRN    0.9 % sodium chloride infusion   IntraVENous PRN    potassium chloride 10 mEq/100 mL IVPB (Peripheral Line)  10 mEq IntraVENous PRN    magnesium sulfate 2000 mg in 50 mL IVPB premix  2,000 mg IntraVENous PRN    ondansetron (ZOFRAN-ODT) disintegrating tablet 4 mg  4 mg Oral Q8H PRN    Or    ondansetron (ZOFRAN) injection 4 mg  4 mg IntraVENous Q6H PRN    polyethylene glycol (GLYCOLAX) packet 17 g  17 g Oral Daily PRN    acetaminophen (TYLENOL) suppository 650 mg  650 mg Rectal Q6H PRN    sodium hypochlorite (DAKINS) 0.125 % external solution   Irrigation BID    piperacillin-tazobactam (ZOSYN) 3,375 mg in sodium chloride 0.9 % 50 mL IVPB (mini-bag)  3,375 mg IntraVENous Q8H    glucose chewable tablet 16 g  4 tablet Oral PRN    dextrose bolus 10% 125 mL  125 mL IntraVENous PRN    Or    dextrose bolus 10% 250 mL  250 mL IntraVENous PRN    glucagon (rDNA) injection 1 mg  1 mg SubCUTAneous PRN    dextrose 10 % infusion   IntraVENous Continuous PRN    insulin lispro (HUMALOG) injection vial 0-8 Units  0-8 Units SubCUTAneous TID WC    insulin lispro (HUMALOG) injection vial 0-4 Units  0-4 Units SubCUTAneous Nightly    vancomycin (VANCOCIN) 750 mg in sodium chloride 0.9 % 250 mL IVPB (vial-mate)  750 mg IntraVENous Q12H        Labs:    Recent Results (from the past 24 hour(s))   POCT Glucose    Collection Time: 12/29/21 11:16 AM   Result Value Ref Range     POC Glucose 273 (H) 70 - 110 mg/dL   POCT Glucose    Collection Time: 12/29/21  3:44 PM   Result Value Ref Range    POC Glucose 362 (H) 70 - 110 mg/dL   POCT Glucose    Collection Time: 12/29/21  9:23 PM   Result Value Ref Range    POC Glucose 372 (H) 70 - 110 mg/dL   Basic Metabolic Panel    Collection Time: 12/30/21 12:39 AM   Result Value Ref Range    Sodium 139 136 - 145 mmol/L    Potassium 4.7 3.5 - 5.5 mmol/L    Chloride 104 100 - 111 mmol/L    CO2 30 21 - 32 mmol/L    Anion Gap 5 3.0 - 18 mmol/L    Glucose 296 (H) 74 - 99 mg/dL    BUN 26 (H) 7.0 - 18 MG/DL    Creatinine 2.99 0.6 - 1.3 MG/DL    Bun/Cre Ratio 27 (H) 12 - 20      Est, Glom Filt Rate >60 >60 ml/min/1.24m2    Calcium 10.1 8.5 - 10.1 MG/DL   Vitamin M42    Collection Time: 12/30/21 12:39 AM   Result Value Ref Range    Vitamin B-12 477 211 - 911 pg/mL   POCT Glucose    Collection Time: 12/30/21  1:45 AM   Result Value Ref Range    POC Glucose 283 (H) 70 - 110 mg/dL   POCT Glucose    Collection Time: 12/30/21  7:54 AM   Result Value Ref Range    POC Glucose 253 (H) 70 - 110 mg/dL         Signed By: Gracy Bruins, MD     Dec 30, 2021

## 2021-12-30 NOTE — Progress Notes (Signed)
Wound vac therapy written for to be placed today.     Nestor Ramp, DO

## 2021-12-30 NOTE — Plan of Care (Signed)
Problem: Occupational Therapy - Adult  Goal: By Discharge: Performs self-care activities at highest level of function for planned discharge setting.  See evaluation for individualized goals.  Description: Occupational Therapy Goals:  Initiated 12/30/2021 to be met within 7-10 days.    1.  Patient will sit EOB with moderate assistance in order to eat meals while sitting up.   2.  Patient will complete bed mobility to roll in order to participate in hygiene needs with moderate assistance .  3.  Patient will perform bathing  with moderate assistance .  4.  Patient will perform toilet transfers with moderate assistance .  5.  Patient will perform all aspects of toileting with moderate assistance .  6.  Patient will participate in upper extremity therapeutic exercise/activities with minimal assistance/contact guard assist for 8-10 minutes to increase strength/endurance for ADLs.    7.  Patient will utilize energy conservation techniques during functional activities with verbal, visual, and tactile cues.    PLOF: Pt lives in a single story home with her dtr. Reports aides assist with ADLs a few hours a day, 2-3 days a week.      Outcome: Progressing     OCCUPATIONAL THERAPY EVALUATION    Patient: [name] Short (66 y.o. female)  Date: 12/30/2021  Primary Diagnosis: Failure to thrive in adult [R62.7]  Fall, initial encounter [W19.XXXA]  Fall at home, initial encounter [phone], D17.616]  Cerebrovascular accident (CVA), unspecified mechanism ([place]) [I63.9]  Procedure(s) (LRB):  SACRAL WOUND DEBRIDEMENT INCISION AND DRAINAGE (N/A) 2 Days Post-Op   Precautions: Fall Risk  PLOF: Pt lives in a single story home with her dtr. Reports aides assist with ADLs a few hours a day, 2-3 days a week.      ASSESSMENT :  Pt was cleared for eval and pt was agreeable. Seen with PT to increase safety and participation. Required max-total assist for bed mobility to improve positioing for to decrease skin breakdown. Pt screamed out in pain during  all movements due to pain in right leg, left arm and at sacral wound. Yelled out in pain when attempting to move left arm, full ROM on right arm. Pt reports that she has assistance for ADLs at home "occasionally" and when asked how long aides stay, she stated "long enough to do what they have to do." Unable to sit EOB or attempt functional transfers at this time. Left pt sitting up in bed with lunch tray, required assistance to cut food and then was independent in self-feeding.     *See pt in the afternoon. After lunch and afternoon meds have been passed     DEFICITS/IMPAIRMENTS:  Performance deficits / Impairments: Decreased functional mobility ;Decreased high-level IADLs;Decreased ADL status;Decreased strength;Decreased ROM;Decreased coordination;Decreased endurance    Patient will benefit from skilled intervention to address the above impairments.  Patient's rehabilitation potential/Prognosis: Fair.  Factors which may influence rehabilitation potential include:   []              None noted  []              Mental ability/status  [x]              Medical condition  [x]              Home/family situation and support systems  []              Safety awareness  [x]              Pain tolerance/management  []   Other:      PLAN :  Recommendations and Planned Interventions:   [x]                Self Care Training                  [x]       Therapeutic Activities  [x]                Functional Mobility Training   []       Cognitive Retraining  [x]                Therapeutic Exercises           [x]       Endurance Activities  [x]                Balance Training                    []       Neuromuscular Re-Education  []                Visual/Perceptual Training     [x]       Home Safety Training  [x]                Patient Education                   [x]       Family Training/Education  []                Other (comment):    Frequency/Duration: Patient will be followed by occupational therapy to address goals, 1-2 times per day/3-5  days per week to address goals.    Further Equipment Recommendations for Discharge: bedside commode, hospital bed, and shower chair    AMPAC: Current research shows that an AM-PAC score of 17 or less is not associated with a discharge to the patient's home setting.  Based on an AM-PAC score and their current ADL deficits; it is recommended that the patient have 3-5 sessions per week of Occupational Therapy at d/c to increase the patient's independence.      This AMPAC score should be considered in conjunction with interdisciplinary team recommendations to determine the most appropriate discharge setting. Patient's social support, diagnosis, medical stability, and prior level of function should also be taken into consideration.     SUBJECTIVE:   Patient stated, "Everything hurts."    OBJECTIVE DATA SUMMARY:     Past Medical History:   Diagnosis Date    Anemia     Arthritis     Chronic pain     Legs and Shoulders    Diabetes ([place])     Exposure to asbestos     History of blood transfusion     History of colon polyps     HTN (hypertension)     Hx of blood clots     DVT    Hyperlipemia     Menopause     Pulmonary nodule     Serum calcium elevated     Sleep apnea     not using cpap    Stroke [facility])      Past Surgical History:   Procedure Laterality Date    CESAREAN SECTION  1987    COLONOSCOPY N/A 01/13/2017    COLONOSCOPY performed by Joycelyn Schmid, MD at [place] N/A 12/09/2021  CERVICAL THREE/FOUR/FIVE/SIX LAMINECTOMY FUSION; C-ARM; STRYKER; EXT BONE GROWTH STIM; 23 HR performed by Candi Leash, MD at [place] Situation:   Social/Functional History  Lives With: Daughter  Type of Home: House  Home Layout: One level  Has the patient had two or more falls in the past year or any fall with injury in the past year?: Unknown  Receives Help From: Personal care attendant, Family  ADL Assistance: Needs assistance  Bath: Maximum  assistance  Dressing: Maximum assistance  Grooming: Minimal assistance  Feeding: Modified independent   Toileting: Needs assistance  Transfer Assistance: Needs assistance  []   Right hand dominant   []   Left hand dominant    Cognitive/Behavioral Status:  Orientation  Overall Orientation Status: Within Normal Limits  Orientation Level: Oriented X4  Cognition  Overall Cognitive Status: WFL    Skin: sacral wound-to get wound vac today   Edema: noted in BLEs    Vision/Perceptual:    Vision  Vision: Within Functional Limits        Coordination: BUE  Coordination: Generally decreased, functional    Strength: BUE  Strength: Generally decreased, functional    Tone & Sensation: BUE  Tone: Normal  Sensation: Intact    Range of Motion: BUE  AROM: Generally decreased, functional (Decreased ROM on LUE)    Functional Mobility and Transfers for ADLs:  Bed Mobility:  Bed Mobility Training  Bed Mobility Training: Yes  Overall Level of Assistance: Maximum assistance  Rolling: Maximum assistance    ADL Assessment:   Feeding: Setup  Grooming: Setup;Contact guard assistance  UE Bathing: Dependent/Total  LE Bathing: Dependent/Total  UE Dressing: Dependent/Total  LE Dressing: Dependent/Total  Toileting: Dependent/Total    Pain:  Pain level pre-treatment: 10/10   Pain level post-treatment: 10/10   Pain Intervention(s): Rest, Ice, Repositioning   Response to intervention: Nurse notified    Activity Tolerance:   Activity Tolerance: Patient limited by pain  Please refer to the flowsheet for vital signs taken during this treatment.    After treatment:   []  Patient left in no apparent distress sitting up in chair  [x]  Patient left in no apparent distress in bed  [x]  Call bell left within reach  [x]  Nursing notified  []  Caregiver present  []  Bed alarm activated    COMMUNICATION/EDUCATION:   Patient Education  Education Given To: Patient  Education Provided: Role of Therapy;Plan of Care;Energy Conservation;Fall Prevention Strategies;ADL Adaptive  Strategies  Education Method: Demonstration  Barriers to Learning: None  Education Outcome: Continued education needed    Thank you for this referral.  Alma Friendly, OT  Minutes: 19    Eval Complexity: Decision Making: Low Complexity

## 2021-12-30 NOTE — Consults (Signed)
Infectious Disease Consultation Note        Reason: Infected sacral decubiti    Current abx Prior abx   Zosyn, vancomycin since 5/26      Lines:       Assessment :   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     Clinical presentation c/w infected necrotic sacral decubiti.    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     Recommendations:    Continue zosyn, vancomycin  Follow up intra op cultures, modify antibiotics accordingly  Place picc line for outpatient iv antibiotics  Follow-up surgery recommendations regarding wound care of sacrum  Follow-up neurology/spine surgery recommendations given lower extremity weakness  We will finalize antibiotic treatment based on Intra-Op cultures    Thank you for consultation request. Above plan was discussed in details with patient, daughter at bedside and primary team. Please call me if any further questions or concerns. Will continue to participate in the care of this patient.    HPI:     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      The daughter notes the patient has been getting progressively weaker over months. She went from a cane to a walker in 6 months.    The patient had a recent procedure(cervical decompression and fusion 2 weeks ago).  As per the patient he was relatively well after that and was able to sit up with physical therapy.  In the interim she developed a sacral ulcer.  She home  and nurses and asked the daughter, they did not mention of any evidence of infection.    She started having right-sided abdominal pain and hence brought to the emergency room on 5/25.In the ED it was noted the patient had Slight tachycardia at 103 but otherwise normal vitals except for slightly lower BP at 104/56. Labs were remarkable  a potassium of 3.4, chloride of 115, glucose of 398, calcium of 10.6, a hemoglobin of 9.0. The patient was given tylenol, 10 units insulin, 1 liter LR and ativan 1 mg. MRI C/T/L spine were obtained. 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 Carney LivingKerner evaluated the MRI and did not think any findings were contributing to her symptoms at this time.  She was noted to have necrotic sacral decubiti.  She was started on antibiotic on 5/26.  General surgery consulted.  I was consulted for further recommendations.      She underwent incision and drainage of sacral wound on 5/27.  Infection extended up to sacrum.  Patient denies any subjective fever, chills at home.  She denies any increasing chest pain, shortness of breath, neck pain.  She feels better today compared to day of admission.  No prior history of MRSA colonization or infection    Past Medical History:   Diagnosis Date  Anemia     Arthritis     Chronic pain     Legs and Shoulders    Diabetes ([facility])     Exposure to asbestos     History of blood transfusion     History of colon polyps     HTN (hypertension)     Hx of blood clots     DVT    Hyperlipemia     Menopause     Pulmonary nodule     Serum calcium elevated     Sleep apnea     not using cpap    Stroke [facility])        Past Surgical History:   Procedure Laterality Date    CESAREAN SECTION  1987    COLONOSCOPY N/A 01/13/2017    COLONOSCOPY performed by Levan Hurst, MD at [facility] ENDOSCOPY    COLONOSCOPY      DILATION AND CURETTAGE OF UTERUS  1995    SPINE SURGERY N/A 12/09/2021    CERVICAL THREE/FOUR/FIVE/SIX LAMINECTOMY FUSION; C-ARM;  STRYKER; EXT BONE GROWTH STIM; 23 HR performed by Pamalee Leyden, MD at [facility] MAIN OR       [email or web address] @    Current Facility-Administered Medications   Medication Dose Route Frequency    insulin glargine (LANTUS) injection vial 15 Units  15 Units SubCUTAneous Daily    lactated ringers IV soln infusion   IntraVENous Continuous    HYDROcodone-acetaminophen (NORCO) 5-325 MG per tablet 1 tablet  1 tablet Oral Q4H PRN    acetaminophen (TYLENOL) tablet 500 mg  500 mg Oral Q4H PRN    furosemide (LASIX) tablet 40 mg  40 mg Oral Daily    sodium chloride flush 0.9 % injection 5-40 mL  5-40 mL IntraVENous 2 times per day    sodium chloride flush 0.9 % injection 5-40 mL  5-40 mL IntraVENous PRN    0.9 % sodium chloride infusion   IntraVENous PRN    potassium chloride 10 mEq/100 mL IVPB (Peripheral Line)  10 mEq IntraVENous PRN    magnesium sulfate 2000 mg in 50 mL IVPB premix  2,000 mg IntraVENous PRN    ondansetron (ZOFRAN-ODT) disintegrating tablet 4 mg  4 mg Oral Q8H PRN    Or    ondansetron (ZOFRAN) injection 4 mg  4 mg IntraVENous Q6H PRN    polyethylene glycol (GLYCOLAX) packet 17 g  17 g Oral Daily PRN    acetaminophen (TYLENOL) suppository 650 mg  650 mg Rectal Q6H PRN    sodium hypochlorite (DAKINS) 0.125 % external solution   Irrigation BID    piperacillin-tazobactam (ZOSYN) 3,375 mg in sodium chloride 0.9 % 50 mL IVPB (mini-bag)  3,375 mg IntraVENous Q8H    glucose chewable tablet 16 g  4 tablet Oral PRN    dextrose bolus 10% 125 mL  125 mL IntraVENous PRN    Or    dextrose bolus 10% 250 mL  250 mL IntraVENous PRN    glucagon (rDNA) injection 1 mg  1 mg SubCUTAneous PRN    dextrose 10 % infusion   IntraVENous Continuous PRN    insulin lispro (HUMALOG) injection vial 0-8 Units  0-8 Units SubCUTAneous TID WC    insulin lispro (HUMALOG) injection vial 0-4 Units  0-4 Units SubCUTAneous Nightly    vancomycin (VANCOCIN) 750 mg in sodium chloride 0.9 % 250 mL IVPB (vial-mate)  750 mg IntraVENous Q12H       Allergies:  Lactose    Family History   Problem Relation  Age of Onset    Diabetes Other 62        parent, NOS    Cancer Father     Hypertension Other 35        parent,NOS    Heart Disease Other 45        parent, NOS    Lung Disease Father      Social History     Socioeconomic History    Marital status: Single     Spouse name: Not on file    Number of children: Not on file    Years of education: Not on file    Highest education level: Not on file   Occupational History    Not on file   Tobacco Use    Smoking status: Every Day     Packs/day: 1.00     Types: Cigarettes, Cigars     Last attempt to quit: 08/2019     Years since quitting: 2.4    Smokeless tobacco: Never   Vaping Use    Vaping Use: Never used   Substance and Sexual Activity    Alcohol use: Yes     Comment: occ    Drug use: Never    Sexual activity: Not on file   Other Topics Concern    Not on file   Social History Narrative    Not on file     Social Determinants of Health     Financial Resource Strain: Not on file   Food Insecurity: Not on file   Transportation Needs: Not on file   Physical Activity: Not on file   Stress: Not on file   Social Connections: Not on file   Intimate Partner Violence: Not on file   Housing Stability: Not on file     Social History     Tobacco Use   Smoking Status Every Day    Packs/day: 1.00    Types: Cigarettes, Cigars    Last attempt to quit: 08/2019    Years since quitting: 2.4   Smokeless Tobacco Never        Temp (24hrs), Avg:98.4 F (36.9 C), Min:98.1 F (36.7 C), Max:98.8 F (37.1 C)    BP 134/75   Pulse 74   Temp 98.8 F (37.1 C) (Oral)   Resp 18   Ht 4\' 11"  (1.499 m)   Wt 144 lb (65.3 kg)   SpO2 96%   BMI 29.08 kg/m     ROS: 12 point ROS obtained in details. Pertinent positives as mentioned in HPI,   otherwise negative    Physical Exam:    General:  Alert, cooperative, no distress, appears stated age.              Head: Normocephalic, without obvious abnormality, atraumatic.   Eyes:  Conjunctivae/corneas clear. EOMs  intact.   Nose: Nares normal. No drainage or sinus tenderness.   Neck: Supple, symmetrical, trachea midline, no adenopathy, thyroid: no enlargement, no carotid bruit and no JVD.   Lungs:   Clear to auscultation bilaterally.   Heart:  Regular rate and rhythm, S1, S2 normal.      Abdomen: Soft, non-tender. Bowel sounds normal.    Extremities: Extremities normal, atraumatic, no cyanosis or edema.   Pulses: 2+ and symmetric all extremities.   Skin:  No rashes or lesions.surgical dressing sacral decubiti not opened   Neurologic: AAOx2,lethargic. No focal motor or sensory deficit.        Labs: Results:   Chemistry Recent Labs  12/27/21  0752 12/29/21  0330 12/30/21  0039   GLUCOSE 180* 186* 296*   NA 145 141 139   K 3.4* 3.3* 4.7   CL 115* 109 104   CO2 BUN 15 18 26*   CREATININE 0.76 0.74 0.96   GLOB  --  3.5  --    ALT  --  21  --    AST  --  11  --       CBC w/Diff Recent Labs     12/27/21  0752 12/28/21  0432 12/29/21  0330   WBC 7.5 10.4 7.9   RBC 3.65* 3.44* 3.27*   HGB 9.0* 8.5* 8.1*   HCT 28.7* 27.1* 25.9*   PLT 328 339 334      Microbiology Results       Procedure Component Value Units Date/Time    Culture, Anaerobic [[id number]] Collected: 12/28/21 1030    Order Status: Sent Specimen: Swab from Sacrum Updated: 12/28/21 2127    Culture, Wound Aerobic Only [[id number]]  (Abnormal) Collected: 12/28/21 1030    Order Status: Completed Specimen: Swab from Sacrum Updated: 12/29/21 1528     Special Requests NO SPECIAL REQUESTS        Gram stain RARE WBC'S         NO ORGANISMS SEEN        Culture FEW PROTEUS SPECIES       Culture, Blood 2 [[id number]] Collected: 12/28/21 0435    Order Status: Completed Specimen: Blood Updated: 12/30/21 0606     Special Requests --        NO SPECIAL REQUESTS  RIGHT  HAND       Culture NO GROWTH 2 DAYS       Culture, Blood 1 [[id number]] Collected: 12/28/21 0432    Order Status: Completed Specimen: Blood Updated: 12/30/21 0606     Special Requests --        NO SPECIAL  REQUESTS  LEFT  HAND       Culture NO GROWTH 2 DAYS       Culture, Wound Aerobic Only [[id number]] Collected: 12/28/21 0000    Order Status: Completed Specimen: Sacrum Updated: 12/29/21 1240     Special Requests NO SPECIAL REQUESTS        Gram stain OCCASIONAL WBCS SEEN               2+ APPARENT Gram negative rods                  1+ Gram positive cocci IN PAIRS           Culture       MODERATE MIXED ENTERIC FLORA                      RADIOLOGY:    All available imaging studies/reports in connect care for this admission were reviewed      Disclaimer: Sections of this note are dictated utilizing voice recognition software, which may have resulted in some phonetic based errors in grammar and contents. Even though attempts were made to correct all the mistakes, some may have been missed, and remained in the body of the document. If questions arise, please contact our department.    Dr. Raiford Simmonds, Infectious Disease Specialist  [phone]  Dec 30, 2021  7:32 AM

## 2021-12-30 NOTE — Plan of Care (Signed)
Problem: Physical Therapy - Adult  Goal: By Discharge: Performs mobility at highest level of function for planned discharge setting.  See evaluation for individualized goals.  Description: Physical Therapy Goals:  Initiated 12/30/2021 to be met within 7-10 days.    1.  Patient will move from supine to sit and sit to supine  and scoot up and down in bed with minimal assistance/contact guard assist.    2.  Patient will transfer from bed to chair and chair to bed with moderate assistance  using the least restrictive device.  3.  Patient will perform sit to stand with moderate assistance .  4.  Patient will ambulate with moderate assistance  for 5 feet with the least restrictive device.     PLOF: Per daughter, pt was ambulating with RW; however, questionable at this time when this was given pt's current sacral wound.      Outcome: Progressing     PHYSICAL THERAPY EVALUATION    Patient: [name] Short (66 y.o. female)  Date: 12/30/2021  Primary Diagnosis: Failure to thrive in adult [R62.7]  Fall, initial encounter [W19.XXXA]  Fall at home, initial encounter [phone], Y92.009]  Cerebrovascular accident (CVA), unspecified mechanism ([place]) [I63.9]  Procedure(s) (LRB):  SACRAL WOUND DEBRIDEMENT INCISION AND DRAINAGE (N/A) 2 Days Post-Op   Precautions: Fall Risk,  ,  ,  ,  ,  ,  ,    PLOF: Max A per previous notes Patient Goals : to not be in pain    ASSESSMENT :  Pt presents with impaired functional mobility, generalized weakness, and decreased activity tolerance in addition to RLE increased tone limiting function. Spoke at length with pt's daughter who reports pt is more cooperative after pain meds and lunch, requests PM treatment tomorrow. Pt required max encouragement and total A to complete rolling in bed, screaming in pain. Pt reports "all" of her body is in pain. Noted increased tone in RLE and LUE during movement. Anticipate pt would benefit from 2 person assist for session.    DEFICITS/IMPAIRMENTS:    , Body Structures,  Functions, Activity Limitations Requiring Skilled Therapeutic Intervention: Decreased functional mobility ;Decreased strength;Decreased ROM;Increased pain    Patient will benefit from skilled intervention to address the above impairments.  Patient's rehabilitation potential/Therapy Prognosis: Fair.  Factors which may influence rehabilitation potential include:   []          None noted  []          Mental ability/status  []          Medical condition  []          Home/family situation and support systems  []          Safety awareness  [x]          Pain tolerance/management  []          Other:      PLAN :  Recommendations and Planned Interventions:   [x]            Bed Mobility Training             [x]     Neuromuscular Re-Education  [x]            Transfer Training                   []     Orthotic/Prosthetic Training  [x]            Gait Training                          []   Modalities  [x]            Therapeutic Exercises           []     Edema Management/Control  [x]            Therapeutic Activities            [x]     Family Training/Education  [x]            Patient Education  []            Other (comment):    Frequency/Duration: Patient will be followed by physical therapy to address goals, 1-2 times per day/3-5 days per week to address goals.    Further Equipment Recommendations for Discharge:  TBD when OOB    AMPAC: AM-PAC Inpatient Mobility Raw Score : 7      Current research shows that an AM-PAC score of 17 (13 without stairs) or less is not associated with a discharge to the patient's home setting. Based on an AM-PAC score and their current functional mobility deficits, it is recommended that the patient have 3-5 sessions per week of Physical Therapy at d/c to increase the patient's independence.     This AMPAC score should be considered in conjunction with interdisciplinary team recommendations to determine the most appropriate discharge setting. Patient's social support, diagnosis, medical stability, and prior level of  function should also be taken into consideration.     SUBJECTIVE:   Patient stated "Ow."    OBJECTIVE DATA SUMMARY:     Past Medical History:   Diagnosis Date    Anemia     Arthritis     Chronic pain     Legs and Shoulders    Diabetes ([place])     Exposure to asbestos     History of blood transfusion     History of colon polyps     HTN (hypertension)     Hx of blood clots     DVT    Hyperlipemia     Menopause     Pulmonary nodule     Serum calcium elevated     Sleep apnea     not using cpap    Stroke [facility])      Past Surgical History:   Procedure Laterality Date    CESAREAN SECTION  1987    COLONOSCOPY N/A 01/13/2017    COLONOSCOPY performed by Joycelyn Schmid, MD at [facility] N/A 12/09/2021    CERVICAL THREE/FOUR/FIVE/SIX LAMINECTOMY FUSION; C-ARM; STRYKER; EXT BONE GROWTH STIM; 23 HR performed by Candi Leash, MD at [place] Situation:  Social/Functional History  Lives With: Daughter  Type of Home: House  Home Layout: One level  Home Equipment: Walker, rolling  Has the patient had two or more falls in the past year or any fall with injury in the past year?: Unknown  Receives Help From: Personal care attendant, Family  ADL Assistance: Needs assistance  Bath: Maximum assistance  Dressing: Maximum assistance  Grooming: Minimal assistance  Feeding: Modified independent   Toileting: Needs assistance  Transfer Assistance: Needs assistance  Critical Behavior:  Orientation  Overall Orientation Status: Within Normal Limits  Orientation Level: Oriented X4  Cognition  Overall Cognitive Status: WFL    Strength:    Strength: Grossly decreased, non-functional  Tone & Sensation:   Tone: Abnormal ((+) babinski (B), (+)  single beat clonus (B). Increased (R) flexor tone in RLE.)  Sensation: Intact  Range Of Motion:  AROM: Grossly decreased, non-functional  PROM: Grossly decreased, non-functional  Functional Mobility:  Bed Mobility:  Bed  Mobility Training  Bed Mobility Training: Yes  Overall Level of Assistance: Maximum assistance  Rolling: Assist X2;Total assistance  Pain:  Pain level pre-treatment: 8/10   Pain level post-treatment: 8/10   Pain Intervention(s): Medication (see MAR); Rest, Ice, Repositioning  Response to intervention: Nurse notified, See doc flow    Activity Tolerance:   Activity Tolerance: Patient limited by pain  Please refer to the flowsheet for vital signs taken during this treatment.    After treatment:   []          Patient left in no apparent distress sitting up in chair  [x]          Patient left in no apparent distress in bed  [x]          Call bell left within reach  [x]          Nursing notified  []          Caregiver present  []          Bed alarm activated  []          SCDs applied    COMMUNICATION/EDUCATION:   Patient Education  Education Given To: Patient;Family  Education Provided: Role of Therapy;Plan of Care  Education Method: Demonstration;Verbal  Barriers to Learning: None  Education Outcome: Continued education needed    Thank you for this referral.  Wendall Papa, PT  Minutes: 23      Eval Complexity: Decision Making: High Complexity

## 2021-12-30 NOTE — Care Coordination-Inpatient (Signed)
..Case Management Assessment  Initial Evaluation    Date/Time of Evaluation: 12/30/2021 3:10 PM  Assessment Completed by: Alycia PattenOMBS,Leshae Mcclay    If patient is discharged prior to next notation, then this note serves as note for discharge by case management.    Patient Name: [name] Short                   Date of Birth: 18-Feb-1956  Diagnosis: Failure to thrive in adult [R62.7]  Fall, initial encounter [W19.XXXA]  Fall at home, initial encounter [phone][W19.XXXA, Y92.009]  Cerebrovascular accident (CVA), unspecified mechanism ([facility]) [I63.9]                   Date / Time: 12/26/2021  7:58 PM    Patient Admission Status: Inpatient   Readmission Risk (Low < 19, Mod (19-27), High > 27): Readmission Risk Score: 15.9    Current PCP: Uvaldo BristleSara B Deatherage, APRN - NP  PCP verified by CM? Yes    Chart Reviewed: Yes      History Provided by: Patient  Patient Orientation: Alert and Oriented    Patient Cognition: Alert    Hospitalization in the last 30 days (Readmission):  Yes    If yes, Readmission Assessment in CM Navigator will be completed.  Marland Kitchen..Readmission Assessment  Number of Days since last admission?: 8-30 days  Previous Disposition: Home with Home Health  Who is being Interviewed: Patient  What was the patient's/caregiver's perception as to why they think they needed to return back to the hospital?: Not enough help at home  Did you visit your Primary Care Physician after you left the hospital, before you returned this time?: No  Why weren't you able to visit your PCP?: Did not have an appointment  Did you see a specialist, such as Cardiac, Pulmonary, Orthopedic Physician, etc. after you left the hospital?: No  Who advised the patient to return to the hospital?: Self-referral  Does the patient report anything that got in the way of taking their medications?: No  In our efforts to provide the best possible care to you and others like you, can you think of anything that we could have done to help you after you left the hospital the first time, so  that you might not have needed to return so soon?: Arrange for more help when leaving the hospital   Advance Directives:      Code Status: Full Code   Patient's Primary Decision Maker is:        Discharge Planning:    Patient lives with: Children Type of Home: Skilled Nursing Facility  Primary Care Giver: Self  Patient Support Systems include: Children   Current Financial resources:    Current community resources: None  Current services prior to admission: None            Current DME: [facility], Walker            Type of Home Care services:  OT, PT, Skilled Therapy    ADLS  Prior functional level: Assistance with the following:, Mobility  Current functional level: Assistance with the following:, Bathing, Dressing, Mobility    PT AM-PAC: 7 /24  OT AM-PAC:   /24    Family can provide assistance at DC: No  Would you like Case Management to discuss the discharge plan with any other family members/significant others, and if so, who? No  Plans to Return to Present Housing: Yes  Other Identified Issues/Barriers to RETURNING to current housing: No issues or concerns.Pt  resides with daughter  Potential Assistance needed at discharge: Skilled Nursing Facility            Potential DME:    Patient expects to discharge to: Skilled nursing facility  Plan for transportation at discharge: Other (see comment) Financial controller at DC)    Financial    Payor: OPTIMA / Plan: OPTIMA HMO / Product Type: *No Product type* /     Does insurance require precert for SNF: Patient will require a prior insurance authorization for SNF    Potential assistance Purchasing Medications: No  Meds-to-Beds request:        [id number] [place], [place] - [address] [phone] Carmon Ginsberg [phone]  [address] [place] [address]  Phone: [phone] Fax: [phone]       12/30/21 1500   Service Assessment   Patient Orientation Alert and Oriented   Cognition Alert   History Provided By Patient   Primary Caregiver Self   Accompanied  By/Relationship pt's daughter, [name] Short   Support Systems Children   PCP Verified by CM Yes   Last Visit to PCP Within last 3 months   Prior Functional Level Assistance with the following:;Mobility   Current Functional Level Assistance with the following:;Bathing;Dressing;Mobility   Can patient return to prior living arrangement Yes   Family able to assist with home care needs: No   Would you like for me to discuss the discharge plan with any other family members/significant others, and if so, who? No   Community Resources None   CM/SW Referral ADLs/IADLs   Social/Functional History   Lives With Daughter   Type of Home House   Home Layout One level   Home Access Stairs to enter with rails   Entrance Stairs - Number of Steps 3   Bathroom Shower/Tub Tub/Shower unit   Chief Strategy Officer chair;Grab bars in Leisure centre manager, rolling;Cane   Receives Help From Home health   ADL Assistance Needs assistance   Toileting Needs assistance   Homemaking Assistance Needs assistance   Homemaking Responsibilities No   Ambulation Assistance Needs assistance   Transfer Assistance Needs assistance   Active Driver Yes   Education [employer] Graduate   Occupation Full time employment   Type of Occupation Clerical   Discharge Planning   Type of Residence Skilled Nursing Facility   Living Arrangements Children   Current Services Prior To Admission None   Current DME Prior to [employer]   Potential Assistance Needed Skilled Nursing Facility   DME Ordered? No   Potential Assistance Purchasing Medications No   Type of Home Care Services OT;PT;Skilled Therapy   Patient expects to be discharged to: Skilled nursing facility   History of falls? 0   Services At/After Discharge   Transition of Care Consult (CM Consult) Discharge Planning   Services At/After Discharge Skilled Nursing Facility (SNF)   [facility] Information Provided? No   Mode of  Transport at Discharge BLS   Confirm Follow Up Transport Other (see comment)  (Medical Transport at DC)   Condition of Participation: Discharge Planning   The Plan for Transition of Care is related to the following treatment goals: Skilled Nursing Facility   Freedom of Choice list was provided with basic dialogue that supports the patient's individualized plan of care/goals, treatment preferences, and shares the quality data associated with the providers?  Yes  (Pt and Pt's daugter  was provided SNF [facility])       Notes:    Factors facilitating achievement of predicted outcomes: Family support, Motivated, Cooperative, and Pleasant    Barriers to discharge: No Barrier at this time     Additional Case Management Notes: Skilled Nursing Facility    The Plan for Transition of Care is related to the following treatment goals of Failure to thrive in adult [R62.7]  Fall, initial encounter [W19.XXXA]  Fall at home, initial encounter ([phone], Y92.009]  Cerebrovascular accident (CVA), unspecified mechanism ([facility]) [I63.9]    IF APPLICABLE: The Patient and/or patient representative [place] and her family were provided with a choice of provider and agrees with the discharge plan. Freedom of choice list with basic dialogue that supports the patient's individualized plan of care/goals and shares the quality data associated with the providers was provided to:     Patient Representative Name:       The Patient and/or Patient Representative Agree with the Discharge Plan?      Alycia Patten, MSW  Care Manager

## 2021-12-30 NOTE — Progress Notes (Signed)
Patient gone down for MRI   2 days s/p debridement sacral ulcer  Wound vac placed  Due to stage and progression of sacral ulcer and proximity to spine, ID treating as osteomyelitis with plans for PICC line outpatient   MRI of cervical spine with resolution of cord compression resolved stenosis  Patient deconditioned, continues with worsening weakness in arm and lower extremities  Cervical spine likely not cause of continued progression of weakness at this time  Awaiting MRI brain and MRA neck r/o stroke  Final cultures pending  Monitor glucose closely  Pt/ot mobilization as tolerated

## 2021-12-30 NOTE — Progress Notes (Signed)
Wound vac applied; black foam and adaptic applied. Patient lying comfortably on right side. No complaints of pain or distress.

## 2021-12-31 ENCOUNTER — Encounter: Payer: PRIVATE HEALTH INSURANCE | Primary: Physician Assistant

## 2021-12-31 ENCOUNTER — Other Ambulatory Visit: Payer: Self-pay

## 2021-12-31 DIAGNOSIS — I6381 Other cerebral infarction due to occlusion or stenosis of small artery: Secondary | ICD-10-CM

## 2021-12-31 DIAGNOSIS — E782 Mixed hyperlipidemia: Secondary | ICD-10-CM

## 2021-12-31 LAB — POCT GLUCOSE
POC Glucose: 181 mg/dL — ABNORMAL HIGH (ref 70–110)
POC Glucose: 291 mg/dL — ABNORMAL HIGH (ref 70–110)
POC Glucose: 316 mg/dL — ABNORMAL HIGH (ref 70–110)
POC Glucose: 341 mg/dL — ABNORMAL HIGH (ref 70–110)
POC Glucose: 344 mg/dL — ABNORMAL HIGH (ref 70–110)
POC Glucose: 439 mg/dL (ref 70–110)
POC Glucose: 441 mg/dL (ref 70–110)

## 2021-12-31 LAB — BASIC METABOLIC PANEL
Anion Gap: 2 mmol/L — ABNORMAL LOW (ref 3.0–18)
BUN/Creatinine Ratio: 29 — ABNORMAL HIGH (ref 12–20)
BUN: 24 mg/dL — ABNORMAL HIGH (ref 7.0–18)
CO2: 31 mmol/L (ref 21–32)
Calcium: 10.6 mg/dL — ABNORMAL HIGH (ref 8.5–10.1)
Chloride: 105 mmol/L (ref 100–111)
Creatinine: 0.82 mg/dL (ref 0.6–1.3)
Est, Glom Filt Rate: >60 mL/min/{1.73_m2} (ref >60)
Glucose: 269 mg/dL — ABNORMAL HIGH (ref 74–99)
Potassium: 3.8 mmol/L (ref 3.5–5.5)
Sodium: 138 mmol/L (ref 136–145)

## 2021-12-31 LAB — CULTURE, WOUND (WITH GRAM STAIN): Gram Stain: NONE SEEN

## 2021-12-31 LAB — VANCOMYCIN LEVEL, TROUGH: Vancomycin Tr: 14.1 ug/mL (ref 10.0–20.0)

## 2021-12-31 MED ORDER — INSULIN LISPRO 100 UNIT/ML IJ SOLN
100 UNIT/ML | Freq: Once | INTRAMUSCULAR | Status: AC
Start: 2021-12-31 — End: 2021-12-30
  Administered 2021-12-31: 02:00:00 5 [IU] via SUBCUTANEOUS

## 2021-12-31 MED FILL — HUMALOG 100 UNIT/ML IJ SOLN: 100 UNIT/ML | INTRAMUSCULAR | Qty: 1

## 2021-12-31 MED FILL — VANCOMYCIN HCL 750 MG IV SOLR: 750 MG | INTRAVENOUS | Qty: 750

## 2021-12-31 MED FILL — PIPERACILLIN SOD-TAZOBACTAM SO 3.375 (3-0.375) G IV SOLR: 3.375 (3-0.375) g | INTRAVENOUS | Qty: 3375

## 2021-12-31 MED FILL — LANTUS 100 UNIT/ML SC SOLN: 100 UNIT/ML | SUBCUTANEOUS | Qty: 1

## 2021-12-31 MED FILL — HYDROCODONE-ACETAMINOPHEN 5-325 MG PO TABS: 5-325 MG | ORAL | Qty: 1

## 2021-12-31 MED FILL — ATORVASTATIN CALCIUM 40 MG PO TABS: 40 MG | ORAL | Qty: 2

## 2021-12-31 MED FILL — FUROSEMIDE 40 MG PO TABS: 40 MG | ORAL | Qty: 1

## 2021-12-31 MED FILL — ASPIRIN LOW DOSE 81 MG PO CHEW: 81 MG | ORAL | Qty: 1

## 2021-12-31 NOTE — Progress Notes (Signed)
[facility] Hospitalist Group  Progress Note    Patient: [name] Short Age: 66 y.o. DOB: Nov 21, 1955 MR#: [id number] SSN: [id number]  Date: 12/31/2021       Subjective/24-hour events:     Comfortable currently, no new issues overnight.    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  Wound VAC to be placed.  Lantus 30 units daily.  Continue SSI.  Further adjustment as necessary based on glycemic control going forward.  Potassium in normal range today.  Monitor and replace further as necessary.  Pain control, bowel regimen.  Neuro following.   ID following - on zosyn, vanc. To have Picc line placed for IV antibiotics when discharge plan is clear.   PT/OT as tolerated.  Anticipate need for placement at discharge.  Daughter would prefer ARU if deemed a candidate.  Continue supportive care otherwise.      Case discussed with:  [x] Patient  [x] Family  [x]  Nursing  [] Case Management  DVT Prophylaxis:  [] Lovenox  [] Hep SQ  [x] SCDs  [] Coumadin   [] On Heparin gtt [] PO anticoagulant    Objective:   VS: BP 135/70   Pulse 89   Temp 99.5 F (37.5 C) (Oral)   Resp 17   Ht 4\' 11"  (1.499 m)   Wt 144 lb (65.3 kg)   SpO2 98%   BMI 29.08 kg/m      Tmax/24hrs: Temp (24hrs), Avg:98.5 F (36.9 C), Min:97.2 F (36.2 C), Max:99.5 F (37.5 C)    Intake/Output Summary (Last 24 hours) at 12/31/2021 1229  Last data filed at 12/31/2021 1000  Gross per 24 hour   Intake 820 ml   Output 2425 ml   Net -1605 ml         Gen:  Appears uncomfortable but In NAD.  Lungs: Clear, no wheezes  Effort nonlabored.  CV: RRR.  Abdomen: Soft, NTTP.  Extremities: Warm, no pitting  edema or ischemia.  Neuro:  Awake and alert.    Current Facility-Administered Medications   Medication Dose Route Frequency    aspirin chewable tablet 81 mg  81 mg Oral Daily    acetaminophen (TYLENOL) tablet 500 mg  500 mg Oral Q4H PRN    insulin glargine (LANTUS) injection vial 30 Units  30 Units SubCUTAneous Daily    atorvastatin (LIPITOR) tablet 80 mg  80 mg Oral Nightly    lactated ringers IV soln infusion   IntraVENous Continuous    HYDROcodone-acetaminophen (NORCO) 5-325 MG per tablet 1 tablet  1 tablet Oral Q4H PRN    furosemide (LASIX) tablet 40 mg  40 mg Oral Daily    sodium chloride flush 0.9 % injection 5-40 mL  5-40 mL IntraVENous 2 times per day    sodium chloride flush 0.9 % injection 5-40 mL  5-40 mL IntraVENous PRN    0.9 % sodium chloride infusion   IntraVENous PRN    potassium chloride 10 mEq/100 mL IVPB (Peripheral Line)  10 mEq IntraVENous PRN    magnesium sulfate 2000 mg in 50 mL IVPB premix  2,000 mg IntraVENous PRN    ondansetron (ZOFRAN-ODT) disintegrating tablet 4 mg  4 mg Oral Q8H PRN    Or    ondansetron (ZOFRAN) injection 4 mg  4 mg IntraVENous Q6H PRN    polyethylene glycol (GLYCOLAX) packet 17 g  17 g Oral Daily PRN    acetaminophen (TYLENOL) suppository 650 mg  650 mg Rectal Q6H PRN    piperacillin-tazobactam (ZOSYN) 3,375 mg in sodium chloride 0.9 % 50 mL IVPB (mini-bag)  3,375 mg IntraVENous Q8H    glucose chewable tablet 16 g  4 tablet Oral PRN    dextrose bolus 10% 125 mL  125 mL IntraVENous PRN    Or    dextrose bolus 10% 250 mL  250 mL IntraVENous PRN    glucagon (rDNA) injection 1 mg  1 mg SubCUTAneous PRN    dextrose 10 % infusion   IntraVENous Continuous PRN    insulin lispro (HUMALOG) injection vial 0-8 Units  0-8 Units SubCUTAneous TID WC    insulin lispro (HUMALOG) injection vial 0-4 Units  0-4 Units SubCUTAneous Nightly    vancomycin (VANCOCIN) 750 mg in sodium chloride 0.9 % 250 mL IVPB (vial-mate)  750 mg IntraVENous Q12H        Labs:    Recent Results (from the past 24  hour(s))   POCT Glucose    Collection Time: 12/30/21  4:02 PM   Result Value Ref Range    POC Glucose 387 (H) 70 - 110 mg/dL   POCT Glucose    Collection Time: 12/30/21  8:27 PM   Result Value Ref Range    POC Glucose 439 (HH) 70 - 110 mg/dL   POCT Glucose    Collection Time: 12/30/21  8:28 PM   Result Value Ref Range    POC Glucose 441 (HH) 70 - 110 mg/dL   Basic Metabolic Panel    Collection Time: 12/31/21 12:29 AM   Result Value Ref Range    Sodium 138 136 - 145 mmol/L    Potassium 3.8 3.5 - 5.5 mmol/L    Chloride 105 100 - 111 mmol/L    CO2 31 21 - 32 mmol/L    Anion Gap 2 (L) 3.0 - 18 mmol/L    Glucose 269 (H) 74 - 99 mg/dL    BUN 24 (H) 7.0 - 18 MG/DL    Creatinine 4.25 0.6 - 1.3 MG/DL    Bun/Cre Ratio 29 (H) 12 - 20      Est, Glom Filt Rate >60 >60 ml/min/1.36m2    Calcium 10.6 (H) 8.5 - 10.1 MG/DL   POCT Glucose    Collection Time: 12/31/21  7:49 AM   Result Value Ref Range    POC Glucose 181 (H) 70 - 110 mg/dL   Vancomycin Level, Trough    Collection Time: 12/31/21 10:39 AM   Result Value Ref Range    Vancomycin Tr 14.1 10.0 - 20.0 ug/mL   POCT Glucose    Collection Time: 12/31/21 11:50 AM   Result Value Ref Range    POC Glucose 344 (H) 70 - 110 mg/dL   POCT Glucose    Collection Time: 12/31/21 11:52 AM   Result Value Ref Range    POC Glucose 316 (H) 70 - 110 mg/dL         Signed By: Henrietta Hoover, DO     Dec 31, 2021

## 2021-12-31 NOTE — Progress Notes (Signed)
FOLLOW UP NOTE    NEUROLOGIC EXAMINATION    Mental status: Awake, alert, oriented x3, follows simple,   CN: VFF, EOMI, PERRLA, face sensation intact , no facial asymmetry noted,tongue midline  Motor: Move upper extremity against gravity but poor effort, left lower extremity 2/5 , AT and GA -4/5, right side able to move some extent but not fully against gravity, AT and GA -4/5, increased lower extremity tone, no clonus,   Left GA atrophy.  Sensory: Grossly intact  Coordination: FNF accurate w/o dysmetria  DTR: 2+ throughout, Plantar withdrawal  Gait: not tested      Impression:      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 The patient was not able to stand up and has had a recent procedure(cervical decompression and fusion 2 weeks ago) and was evaluated for these symptoms by her spine surgeon Dr. Carney Living, he does not think her issues are related to her recent surgery per his note from today. The daughter notes the patient has been getting progressively weaker over months. Sh went from a cane to a walker in 6 months. No nausea or vomiting, no diarrhea or constipation per the patient's daughter  MRI C spine showed post cervical change, chronic DDD in the thoracic and lumbar spine, Dr. Carney Living evaluated the MRI of those aforementioned regions and did not think any findings were contributing to her symptoms at this time.   Daughter report patient have memory issues and forgetful last six months, patient admit taking her aspirin regularly before.  Patient report hand lower lower extremity paresthesias, neck pain, follow simple commands appropriately answers questions.  MRI BRAIN-  Small lacunar infarct  in the left thalamus seen on prior head CT from 5/26 is  compatible with an acute infarct.  Small chronic infarcts in the left cerebellar hemisphere. Postsurgical changes  in the cervical spine. Multiple chronic infarcts within the pons. Mild to    Follow up today   No new complain, neuro exam unchanged, Mri with acute stroke.     STROKE-Lower extremity weakness from new stroke.  MRI BRAIN showed new stroke and old strokes as well  MRA HEAD AND NECK-Pending  ECHO-EF-60    RECOMMEND  Maximize control of risk factors.  Aspirin and statins.  DVT prophylaxis.  PT AND OT.     History of sleep apnea- compliance with machine.risk factor.  Memory problems , vascular dementia ? Mri in 34 showed old stroke and hippocampal volume loss.  Check B12, neuropsychological  evaluation out patient  Memantine- 5 mg po qd for now.     Plan:   As above     I spent 10 minutes with the patient in face-to-face consultation, of which greater than 50% was spent in counseling and coordination of care as described above.     PLEASE NOTE:   This document has been produced using voice recognition software.

## 2021-12-31 NOTE — Consults (Addendum)
Room 470: wound vac orders placed on chart.  Wound vac dressing was placed by unit nursing staff yesterday 12/30/2021.  Wound measurements placed into [employer]M express.   Left message with Tasha CM on voicemail re: measurements are in express if needed.   Will turn over care to nursing staff at this time.  Gared Gillie Mel Almond BSN, RN, [employer], [place]

## 2021-12-31 NOTE — Progress Notes (Signed)
Infectious Disease progress  Note        Reason: Infected sacral decubiti    Current abx Prior abx   Zosyn, vancomycin since 5/26      Lines:       Assessment :   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     Cervical spondylotic myelopathy: s/p  C3, C4, C5, C6 laminectomy; posterior cervical fusion C3, C4, C5, C6; segmental instrumentation, [employer] type, C3, C4, C5, C6 with lateral mass screws on 12/09/21    Clinical presentation c/w infected necrotic sacral decubiti.    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     Recommendations:    Continue zosyn, vancomycin for now  Follow up susceptibility of Pseudomonas in intra op cultures, will finalize antibiotics accordingly  Follow-up surgery recommendations regarding wound care of sacrum  Follow-up neurology/spine surgery recommendations given lower extremity weakness  Plan to give outpatient IV antibiotics till 02/06/22  Needs weekly cbc with diff, renal function panel while on iv antibiotics to monitor for antibiotic side effects    Thank you for consultation request. Above plan was discussed in details with patient, daughter at bedside and primary team. Please call me if any further questions or concerns. Will continue to participate in the care of this patient.    HPI:     no new complaints. Patient denies any subjective fever, chills at home.  She denies any increasing chest pain, shortness of breath, neck pain.  Past Medical History:   Diagnosis Date    Anemia     Arthritis     Chronic  pain     Legs and Shoulders    Diabetes (HCC)     Exposure to asbestos     History of blood transfusion     History of colon polyps     HTN (hypertension)     Hx of blood clots     DVT    Hyperlipemia     Menopause     Pulmonary nodule     Serum calcium elevated     Sleep apnea     not using cpap    Stroke [facility])        Past Surgical History:   Procedure Laterality Date    CESAREAN SECTION  1987    COLONOSCOPY N/A 01/13/2017    COLONOSCOPY performed by Levan Hurst, MD at [facility] ENDOSCOPY    COLONOSCOPY      DILATION AND CURETTAGE OF UTERUS  1995    RECTAL SURGERY N/A 12/28/2021    SACRAL WOUND DEBRIDEMENT INCISION AND DRAINAGE performed by Phineas Semen, DO at [facility] MAIN OR    SPINE SURGERY N/A 12/09/2021    CERVICAL THREE/FOUR/FIVE/SIX LAMINECTOMY FUSION; C-ARM; STRYKER; EXT BONE GROWTH STIM; 23 HR performed by Pamalee Leyden, MD at [facility] MAIN OR       @    Current Facility-Administered Medications   Medication Dose Route Frequency    aspirin chewable tablet 81 mg  81 mg Oral Daily    acetaminophen (  TYLENOL) tablet 500 mg  500 mg Oral Q4H PRN    insulin glargine (LANTUS) injection vial 30 Units  30 Units SubCUTAneous Daily    atorvastatin (LIPITOR) tablet 80 mg  80 mg Oral Nightly    lactated ringers IV soln infusion   IntraVENous Continuous    HYDROcodone-acetaminophen (NORCO) 5-325 MG per tablet 1 tablet  1 tablet Oral Q4H PRN    furosemide (LASIX) tablet 40 mg  40 mg Oral Daily    sodium chloride flush 0.9 % injection 5-40 mL  5-40 mL IntraVENous 2 times per day    sodium chloride flush 0.9 % injection 5-40 mL  5-40 mL IntraVENous PRN    0.9 % sodium chloride infusion   IntraVENous PRN    potassium chloride 10 mEq/100 mL IVPB (Peripheral Line)  10 mEq IntraVENous PRN    magnesium sulfate 2000 mg in 50 mL IVPB premix  2,000 mg IntraVENous PRN    ondansetron (ZOFRAN-ODT) disintegrating tablet 4 mg  4 mg Oral Q8H PRN    Or    ondansetron (ZOFRAN) injection 4 mg  4 mg IntraVENous Q6H PRN    polyethylene  glycol (GLYCOLAX) packet 17 g  17 g Oral Daily PRN    acetaminophen (TYLENOL) suppository 650 mg  650 mg Rectal Q6H PRN    piperacillin-tazobactam (ZOSYN) 3,375 mg in sodium chloride 0.9 % 50 mL IVPB (mini-bag)  3,375 mg IntraVENous Q8H    glucose chewable tablet 16 g  4 tablet Oral PRN    dextrose bolus 10% 125 mL  125 mL IntraVENous PRN    Or    dextrose bolus 10% 250 mL  250 mL IntraVENous PRN    glucagon (rDNA) injection 1 mg  1 mg SubCUTAneous PRN    dextrose 10 % infusion   IntraVENous Continuous PRN    insulin lispro (HUMALOG) injection vial 0-8 Units  0-8 Units SubCUTAneous TID WC    insulin lispro (HUMALOG) injection vial 0-4 Units  0-4 Units SubCUTAneous Nightly    vancomycin (VANCOCIN) 750 mg in sodium chloride 0.9 % 250 mL IVPB (vial-mate)  750 mg IntraVENous Q12H       Allergies: Lactose    Family History   Problem Relation Age of Onset    Diabetes Other 5445        parent, NOS    Cancer Father     Hypertension Other 35        parent,NOS    Heart Disease Other 7162        parent, NOS    Lung Disease Father      Social History     Socioeconomic History    Marital status: Single     Spouse name: Not on file    Number of children: Not on file    Years of education: Not on file    Highest education level: Not on file   Occupational History    Not on file   Tobacco Use    Smoking status: Every Day     Packs/day: 1.00     Types: Cigarettes, Cigars     Last attempt to quit: 08/2019     Years since quitting: 2.4    Smokeless tobacco: Never   Vaping Use    Vaping Use: Never used   Substance and Sexual Activity    Alcohol use: Yes     Comment: occ    Drug use: Never    Sexual activity: Not on file   Other Topics  Concern    Not on file   Social History Narrative    Not on file     Social Determinants of Health     Financial Resource Strain: Not on file   Food Insecurity: Not on file   Transportation Needs: Not on file   Physical Activity: Not on file   Stress: Not on file   Social Connections: Not on file   Intimate  Partner Violence: Not on file   Housing Stability: Not on file     Social History     Tobacco Use   Smoking Status Every Day    Packs/day: 1.00    Types: Cigarettes, Cigars    Last attempt to quit: 08/2019    Years since quitting: 2.4   Smokeless Tobacco Never        Temp (24hrs), Avg:98.2 F (36.8 C), Min:97.2 F (36.2 C), Max:99 F (37.2 C)    BP 131/70   Pulse 78   Temp 99 F (37.2 C) (Oral)   Resp 16   Ht 4\' 11"  (1.499 m)   Wt 144 lb (65.3 kg)   SpO2 98%   BMI 29.08 kg/m     ROS: 12 point ROS obtained in details. Pertinent positives as mentioned in HPI,   otherwise negative    Physical Exam:    General:  Alert, cooperative, no distress, appears stated age.              Head: Normocephalic, without obvious abnormality, atraumatic.   Eyes:  Conjunctivae/corneas clear. EOMs intact.   Nose: Nares normal. No drainage or sinus tenderness.   Neck: Supple, symmetrical, trachea midline, no adenopathy, thyroid: no enlargement, no carotid bruit and no JVD.   Lungs:   Clear to auscultation bilaterally.   Heart:  Regular rate and rhythm, S1, S2 normal.      Abdomen: Soft, non-tender. Bowel sounds normal.    Extremities: Extremities normal, atraumatic, no cyanosis or edema.   Pulses: 2+ and symmetric all extremities.   Skin:  wound vac sacral decubiti not opened   Neurologic: AAOx2,lethargic. No focal motor or sensory deficit.        Labs: Results:   Chemistry Recent Labs     12/29/21  0330 12/30/21  0039 12/31/21  0029   GLUCOSE 186* 296* 269*   NA 141 139 138   K 3.3* 4.7 3.8   CL 109 104 105   CO2 28 30 31    BUN 18 26* 24*   CREATININE 0.74 0.96 0.82   GLOB 3.5  --   --    ALT 21  --   --    AST 11  --   --         CBC w/Diff Recent Labs     12/29/21  0330   WBC 7.9   RBC 3.27*   HGB 8.1*   HCT 25.9*   PLT 334        Microbiology Results       Procedure Component Value Units Date/Time    Culture, Anaerobic  (Abnormal) Collected: 12/28/21 1030    Order Status: Completed Specimen: Swab from Sacrum  Updated: 12/30/21 1137     Special Requests NO SPECIAL REQUESTS        Culture       LIGHT MIXED ANAEROBIC GRAM NEGATIVE RODS          Culture, Wound Aerobic Only 12/30/21  (Abnormal)  (Susceptibility) Collected: 12/28/21 1030    Order Status:  Completed Specimen: Swab from Sacrum Updated: 12/31/21 0733     Special Requests NO SPECIAL REQUESTS        Gram stain RARE WBC'S         NO ORGANISMS SEEN        Culture FEW Proteus mirabilis         FEW Enterococcus faecalis       Susceptibility        Proteus mirabilis      BACTERIAL SUSCEPTIBILITY PANEL MIC      amikacin <=2 ug/mL Sensitive      ampicillin <=2 ug/mL Sensitive      ampicillin-sulbactam <=2 ug/mL Sensitive      ceFAZolin <=4 ug/mL Sensitive      cefepime <=1 ug/mL Sensitive      cefOXitin <=4 ug/mL Sensitive      cefTAZidime <=1 ug/mL Sensitive      cefTRIAXone <=1 ug/mL Sensitive      ciprofloxacin <=0.25 ug/mL Sensitive      gentamicin <=1 ug/mL Sensitive      levofloxacin <=0.12 ug/mL Sensitive      meropenem <=0.25 ug/mL Sensitive      piperacillin-tazobactam <=4 ug/mL Sensitive      tobramycin <=1 ug/mL Sensitive      trimethoprim-sulfamethoxazole <=20 ug/mL Sensitive                       Susceptibility        Enterococcus faecalis      BACTERIAL SUSCEPTIBILITY PANEL MIC      ampicillin <=2 ug/mL Resistant      DAPTOmycin 2 ug/mL Sensitive      linezolid 2 ug/mL Sensitive      vancomycin 1 ug/mL Sensitive                           Culture, Blood 2 [[id number]] Collected: 12/28/21 0435    Order Status: Completed Specimen: Blood Updated: 12/30/21 0606     Special Requests --        NO SPECIAL REQUESTS  RIGHT  HAND       Culture NO GROWTH 2 DAYS       Culture, Blood 1 [[id number]] Collected: 12/28/21 0432    Order Status: Completed Specimen: Blood Updated: 12/30/21 0606     Special Requests --        NO SPECIAL REQUESTS  LEFT  HAND       Culture NO GROWTH 2 DAYS       Culture, Wound Aerobic Only [[id number]]  (Abnormal) Collected: 12/28/21 0000     Order Status: Completed Specimen: Sacrum Updated: 12/31/21 0730     Special Requests NO SPECIAL REQUESTS        Gram stain OCCASIONAL WBCS SEEN               2+ APPARENT Gram negative rods                  1+ Gram positive cocci IN PAIRS           Culture       LIGHT Pseudomonas species IDENTIFICATION AND SUSCEPTIBILITY TO FOLLOW                  MODERATE MIXED ENTERIC FLORA                      RADIOLOGY:    All available imaging studies/reports in connect care for  this admission were reviewed      Disclaimer: Sections of this note are dictated utilizing voice recognition software, which may have resulted in some phonetic based errors in grammar and contents. Even though attempts were made to correct all the mistakes, some may have been missed, and remained in the body of the document. If questions arise, please contact our department.    Dr. Raiford Simmonds, Infectious Disease Specialist  [phone]  Dec 31, 2021  8:41 AM

## 2021-12-31 NOTE — Progress Notes (Addendum)
Pt laying supine in bed, pt with decreased verbal communication despite max encouragement to participate in OT evaluation, pt responded several times "Call my daughter" and "Ask my daughter". Nursing notified and OT to follow up tomorrow.

## 2022-01-01 LAB — POCT GLUCOSE
POC Glucose: 200 mg/dL — ABNORMAL HIGH (ref 70–110)
POC Glucose: 133 mg/dL — ABNORMAL HIGH (ref 70–110)
POC Glucose: 180 mg/dL — ABNORMAL HIGH (ref 70–110)
POC Glucose: 229 mg/dL — ABNORMAL HIGH (ref 70–110)
POC Glucose: 239 mg/dL — ABNORMAL HIGH (ref 70–110)

## 2022-01-01 LAB — LIPID PANEL
Chol/HDL Ratio: 4.5 (ref 0–5.0)
Cholesterol, Total: 153 mg/dL (ref <200)
HDL: 34 mg/dL — ABNORMAL LOW (ref 40–60)
LDL Calculated: 84.4 mg/dL (ref 0–100)
Triglycerides: 173 mg/dL — ABNORMAL HIGH (ref <150)
VLDL Cholesterol Calculated: 34.6 mg/dL

## 2022-01-01 LAB — BASIC METABOLIC PANEL
Anion Gap: 4 mmol/L (ref 3.0–18)
BUN/Creatinine Ratio: 35 — ABNORMAL HIGH (ref 12–20)
BUN: 23 mg/dL — ABNORMAL HIGH (ref 7.0–18)
CO2: 30 mmol/L (ref 21–32)
Calcium: 10.7 mg/dL — ABNORMAL HIGH (ref 8.5–10.1)
Chloride: 107 mmol/L (ref 100–111)
Creatinine: 0.66 mg/dL (ref 0.6–1.3)
Est, Glom Filt Rate: >60 mL/min/{1.73_m2} (ref >60)
Glucose: 112 mg/dL — ABNORMAL HIGH (ref 74–99)
Potassium: 3.8 mmol/L (ref 3.5–5.5)
Sodium: 141 mmol/L (ref 136–145)

## 2022-01-01 LAB — CULTURE, WOUND (WITH GRAM STAIN)

## 2022-01-01 MED ORDER — VANCOMYCIN HCL 1 G IV SOLR
1 g | Freq: Two times a day (BID) | INTRAVENOUS | Status: AC
Start: 2022-01-01 — End: 2022-01-05
  Administered 2022-01-01 – 2022-01-05 (×8): 1000 mg via INTRAVENOUS

## 2022-01-01 MED FILL — ASPIRIN LOW DOSE 81 MG PO CHEW: 81 MG | ORAL | Qty: 1

## 2022-01-01 MED FILL — PIPERACILLIN SOD-TAZOBACTAM SO 3.375 (3-0.375) G IV SOLR: 3.375 (3-0.375) g | INTRAVENOUS | Qty: 3375

## 2022-01-01 MED FILL — VANCOMYCIN HCL 1 G IV SOLR: 1 g | INTRAVENOUS | Qty: 1000

## 2022-01-01 MED FILL — LANTUS 100 UNIT/ML SC SOLN: 100 UNIT/ML | SUBCUTANEOUS | Qty: 1

## 2022-01-01 MED FILL — HYDROCODONE-ACETAMINOPHEN 5-325 MG PO TABS: 5-325 MG | ORAL | Qty: 1

## 2022-01-01 MED FILL — HUMALOG 100 UNIT/ML IJ SOLN: 100 UNIT/ML | INTRAMUSCULAR | Qty: 1

## 2022-01-01 MED FILL — ATORVASTATIN CALCIUM 40 MG PO TABS: 40 MG | ORAL | Qty: 2

## 2022-01-01 MED FILL — VANCOMYCIN HCL 750 MG IV SOLR: 750 MG | INTRAVENOUS | Qty: 750

## 2022-01-01 MED FILL — FUROSEMIDE 40 MG PO TABS: 40 MG | ORAL | Qty: 1

## 2022-01-01 NOTE — Progress Notes (Signed)
Patient blood sugar to be repeated due to being completed too soon from when dinner arrived on floor.

## 2022-01-01 NOTE — Progress Notes (Signed)
ARU/IPR REFERRAL CONTACT NOTE  [facility]      Thank you for the opportunity to review this patient's case for admission to [facility].    Based on our pre-admission screening:     [ x] Our Team/Medical Director is following this case.   Comments: Referral received during IDR. Met with patient and daughter at bedside. With patient's permission discussed ARU admission.    Pt and daughter agreeable to ARU. Will initiate auth with [employer]     Again, Thank you for this referral. Should you have any questions please do not hesitate to call.     Sincerely,  Alberteen Spindle    Clinical Liaison  [facility]  [phone]

## 2022-01-01 NOTE — Care Coordination-Inpatient (Signed)
CM reached out to Dr.Yeshtokin in regards to pt's wound vac RX. CM stated  a signed  RX is needed to order pt's home wound vac.     CM placed RX on pt's chart for signature.        Arvid Right, MSW  Care Manager

## 2022-01-01 NOTE — Plan of Care (Signed)
Problem: Occupational Therapy - Adult  Goal: By Discharge: Performs self-care activities at highest level of function for planned discharge setting.  See evaluation for individualized goals.  Description: Occupational Therapy Goals:  Initiated 12/30/2021 to be met within 7-10 days.    1.  Patient will sit EOB with moderate assistance in order to eat meals while sitting up.   2.  Patient will complete bed mobility to roll in order to participate in hygiene needs with moderate assistance .  3.  Patient will perform bathing  with moderate assistance .  4.  Patient will perform toilet transfers with moderate assistance .  5.  Patient will perform all aspects of toileting with moderate assistance .  6.  Patient will participate in upper extremity therapeutic exercise/activities with minimal assistance/contact guard assist for 8-10 minutes to increase strength/endurance for ADLs.    7.  Patient will utilize energy conservation techniques during functional activities with verbal, visual, and tactile cues.    PLOF: Pt lives in a single story home with her dtr. Reports aides assist with ADLs a few hours a day, 2-3 days a week.      Outcome: Progressing   OCCUPATIONAL THERAPY TREATMENT    Patient: [name] Short (66 y.o. female)  Date: 01/01/2022  Diagnosis: Failure to thrive in adult [R62.7]  Fall, initial encounter [W19.XXXA]  Fall at home, initial encounter [phone].[id number], G99.242]  Cerebrovascular accident (CVA), unspecified mechanism ([place]) [I63.9] Fall  Procedure(s) (LRB):  SACRAL WOUND DEBRIDEMENT INCISION AND DRAINAGE (N/A) 4 Days Post-Op  Precautions: Fall Risk,  ,  ,  ,  ,  ,  ,    PLOF:   Pt lives in a single story home with her dtr. Reports aides assist with ADLs a few hours a day, 2-3 days a week.     Chart, occupational therapy assessment, plan of care, and goals were reviewed.  ASSESSMENT:  PT co tx to maximize pt safety and participation. Pt presented supine in bed upon entry, daughter present, requiring some  encouragement for participation. Pt assisted to EOB MAX A in prep for functional tasks 2/2 increased pain from buttocks wounds. Once sitting, pt demo G balance requiring TD donning her socks. STS transfer MIN A with RW x 3 trials in prep for [facility] transfers. Pt able to take lateral steps towards HOB in prep for BR mobility. She was assisted back to supine and positioned for comfort. She was left with HOB elevated, bed alarm active, and all needs left within reach. RN made aware.   Progression toward goals:  []           Improving appropriately and progressing toward goals  [x]           Improving slowly and progressing toward goals  []           Not making progress toward goals and plan of care will be adjusted     PLAN:  Patient continues to benefit from skilled intervention to address the above impairments.  Continue treatment per established plan of care.    Further Equipment Recommendations for Discharge: RW, [facility], hospital bed    AMPAC: AM-PAC Inpatient Daily Activity Raw Score: 12    Current research shows that an AM-PAC score of 17 or less is not associated with a discharge to the patient's home setting. Based on an AM-PAC score and their current ADL deficits; it is recommended that the patient have 3-5 sessions per week of Occupational Therapy at d/c to increase the patient's independence.  SUBJECTIVE:   Patient stated, "They are going to change my wound vac soon."    OBJECTIVE DATA SUMMARY:   Cognitive/Behavioral Status:  Orientation  Overall Orientation Status: Within Normal Limits  Orientation Level: Oriented X4       Functional Mobility and Transfers for ADLs:   Bed Mobility:  Bed Mobility Training  Rolling: Moderate assistance  Supine to Sit: Moderate assistance;Maximum assistance  Sit to Supine: Moderate assistance  Scooting: Total assistance   Transfers:  Pharmacologist: Yes  Sit to Stand: Minimum assistance  Stand to Sit: Minimum assistance    Balance:  Balance  Sitting:  Intact  Standing: With support  Standing - Static: Fair  Standing - Dynamic: Fair    ADL Intervention:     LE Dressing: Dependent/Total       Pain:  Pain level pre-treatment: 0/10   Pain level post-treatment: 0/10      Activity Tolerance:    Activity Tolerance: Patient limited by pain  Please refer to the flowsheet for vital signs taken during this treatment.  After treatment:   []   Patient left in no apparent distress sitting up in chair  [x]   Patient left in no apparent distress in bed  [x]   Call bell left within reach  [x]   Nursing notified  [x]   Caregiver present  [x]   Bed alarm activated    COMMUNICATION/EDUCATION:   Patient Education  Education Given To: Patient  Education Provided: Role of Therapy;Energy Conservation;Fall Prevention Strategies;Plan of Care;ADL Adaptive Strategies;Transfer Training  Education Method: Teach Back;Verbal;Demonstration  Barriers to Learning: None  Education Outcome: Continued education needed      Thank you for this referral.  Stephani Police Earley Grobe, OTA  Minutes: 31

## 2022-01-01 NOTE — Care Coordination-Inpatient (Signed)
CM talked with pt and pt's daughter at bedside to discuss pt's plan of care to SNF. Pt and pt's daughter provided the following SNF choices for placement:   [facility] at [place], [place], [place], [facility] [place] and [employer] and [address].         CM sent SNF referrals via [facility] to [facility] [place] and [employer] and [address].CM sent referrals to the following via [employer] at [place], [place] and [place]. CM awaiting responses.           Alycia Patten, MSW  Care Manager

## 2022-01-01 NOTE — Progress Notes (Signed)
Infectious Disease progress  Note        Reason: Infected sacral decubiti    Current abx Prior abx   Zosyn, vancomycin since 5/26      Lines:       Assessment :   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     Cervical spondylotic myelopathy: s/p  C3, C4, C5, C6 laminectomy; posterior cervical fusion C3, C4, C5, C6; segmental instrumentation, [employer] type, C3, C4, C5, C6 with lateral mass screws on 12/09/21    Clinical presentation c/w infected necrotic sacral decubiti.    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     Recommendations:    Continue zosyn, vancomycin till 02/06/22  Follow-up surgery recommendations regarding wound care of sacrum  Follow-up neurology/spine surgery recommendations given lower extremity weakness  Needs weekly cbc with diff, renal function panel while on iv antibiotics to monitor for antibiotic side effects     Above plan was discussed in details with patient, daughter at bedside and primary team. Please call me if any further questions or concerns. Will continue to participate in the care of this patient.    HPI:     no new complaints. Patient denies any subjective fever, chills at home.  She denies any increasing chest pain, shortness of breath, neck pain.  Past Medical History:   Diagnosis Date    Anemia     Arthritis     Chronic pain     Legs and Shoulders    Diabetes (HCC)     Exposure to asbestos     History of blood transfusion     History of colon polyps     HTN (hypertension)     Hx of blood clots     DVT     Hyperlipemia     Menopause     Pulmonary nodule     Serum calcium elevated     Sleep apnea     not using cpap    Stroke [facility](HCC)        Past Surgical History:   Procedure Laterality Date    CESAREAN SECTION  1987    COLONOSCOPY N/A 01/13/2017    COLONOSCOPY performed by Levan Hursthomas Duntemann, MD at [facility]    COLONOSCOPY      DILATION AND CURETTAGE OF UTERUS  1995    RECTAL SURGERY N/A 12/28/2021    SACRAL WOUND DEBRIDEMENT INCISION AND DRAINAGE performed by Phineas SemenNicole Yeshtokin, DO at [facility]    SPINE SURGERY N/A 12/09/2021    CERVICAL THREE/FOUR/FIVE/SIX LAMINECTOMY FUSION; C-ARM; STRYKER; EXT BONE GROWTH STIM; 23 HR performed by Pamalee LeydenMark B Kerner, MD at [facility]       [email or web address] @    Current Facility-Administered Medications   Medication Dose Route Frequency    aspirin chewable tablet 81 mg  81 mg Oral Daily    acetaminophen (TYLENOL) tablet 500 mg  500 mg Oral Q4H PRN    insulin glargine (LANTUS) injection vial 30 Units  30 Units SubCUTAneous Daily  atorvastatin (LIPITOR) tablet 80 mg  80 mg Oral Nightly    lactated ringers IV soln infusion   IntraVENous Continuous    HYDROcodone-acetaminophen (NORCO) 5-325 MG per tablet 1 tablet  1 tablet Oral Q4H PRN    furosemide (LASIX) tablet 40 mg  40 mg Oral Daily    sodium chloride flush 0.9 % injection 5-40 mL  5-40 mL IntraVENous 2 times per day    sodium chloride flush 0.9 % injection 5-40 mL  5-40 mL IntraVENous PRN    0.9 % sodium chloride infusion   IntraVENous PRN    potassium chloride 10 mEq/100 mL IVPB (Peripheral Line)  10 mEq IntraVENous PRN    magnesium sulfate 2000 mg in 50 mL IVPB premix  2,000 mg IntraVENous PRN    ondansetron (ZOFRAN-ODT) disintegrating tablet 4 mg  4 mg Oral Q8H PRN    Or    ondansetron (ZOFRAN) injection 4 mg  4 mg IntraVENous Q6H PRN    polyethylene glycol (GLYCOLAX) packet 17 g  17 g Oral Daily PRN    acetaminophen (TYLENOL) suppository 650 mg  650 mg Rectal Q6H PRN    piperacillin-tazobactam (ZOSYN) 3,375 mg in sodium chloride 0.9 % 50  mL IVPB (mini-bag)  3,375 mg IntraVENous Q8H    glucose chewable tablet 16 g  4 tablet Oral PRN    dextrose bolus 10% 125 mL  125 mL IntraVENous PRN    Or    dextrose bolus 10% 250 mL  250 mL IntraVENous PRN    glucagon (rDNA) injection 1 mg  1 mg SubCUTAneous PRN    dextrose 10 % infusion   IntraVENous Continuous PRN    insulin lispro (HUMALOG) injection vial 0-8 Units  0-8 Units SubCUTAneous TID WC    insulin lispro (HUMALOG) injection vial 0-4 Units  0-4 Units SubCUTAneous Nightly    vancomycin (VANCOCIN) 750 mg in sodium chloride 0.9 % 250 mL IVPB (vial-mate)  750 mg IntraVENous Q12H       Allergies: Lactose    Family History   Problem Relation Age of Onset    Diabetes Other 43        parent, NOS    Cancer Father     Hypertension Other 35        parent,NOS    Heart Disease Other 44        parent, NOS    Lung Disease Father      Social History     Socioeconomic History    Marital status: Single     Spouse name: Not on file    Number of children: Not on file    Years of education: Not on file    Highest education level: Not on file   Occupational History    Not on file   Tobacco Use    Smoking status: Every Day     Packs/day: 1.00     Types: Cigarettes, Cigars     Last attempt to quit: 08/2019     Years since quitting: 2.4    Smokeless tobacco: Never   Vaping Use    Vaping Use: Never used   Substance and Sexual Activity    Alcohol use: Yes     Comment: occ    Drug use: Never    Sexual activity: Not on file   Other Topics Concern    Not on file   Social History Narrative    Not on file     Social Determinants of Health  Financial Resource Strain: Not on file   Food Insecurity: Not on file   Transportation Needs: Not on file   Physical Activity: Not on file   Stress: Not on file   Social Connections: Not on file   Intimate Partner Violence: Not on file   Housing Stability: Not on file     Social History     Tobacco Use   Smoking Status Every Day    Packs/day: 1.00    Types: Cigarettes, Cigars    Last attempt to  quit: 08/2019    Years since quitting: 2.4   Smokeless Tobacco Never        Temp (24hrs), Avg:99.1 F (37.3 C), Min:98.1 F (36.7 C), Max:99.8 F (37.7 C)    BP (!) 146/70   Pulse 82   Temp 98.1 F (36.7 C) (Oral)   Resp 20   Ht  (1.499 m)   Wt 144 lb (65.3 kg)   SpO2 99%   BMI 29.08 kg/m     ROS: 12 point ROS obtained in details. Pertinent positives as mentioned in HPI,   otherwise negative    Physical Exam:    General:  Alert, cooperative, no distress, appears stated age.              Head: Normocephalic, without obvious abnormality, atraumatic.   Eyes:  Conjunctivae/corneas clear. EOMs intact.   Nose: Nares normal. No drainage or sinus tenderness.   Neck: Supple, symmetrical, trachea midline, no adenopathy, thyroid: no enlargement, no carotid bruit and no JVD.   Lungs:   Clear to auscultation bilaterally.   Heart:  Regular rate and rhythm, S1, S2 normal.      Abdomen: Soft, non-tender. Bowel sounds normal.    Extremities: Extremities normal, atraumatic, no cyanosis or edema.   Pulses: 2+ and symmetric all extremities.   Skin:  wound vac sacral decubiti not opened   Neurologic: AAOx2,lethargic. No focal motor or sensory deficit.        Labs: Results:   Chemistry Recent Labs     12/30/21  0039 12/31/21  0029 01/01/22  0505   GLUCOSE 296* 269* 112*   NA 139 138 141   K 4.7 3.8 3.8   CL 104 105 107   CO2 BUN 26* 24* 23*   CREATININE 0.96 0.82 0.66        CBC w/Diff No results for input(s): WBC, RBC, HGB, HCT, PLT in the last 72 hours.    Invalid input(s): GRANS, LYMPH, EOS     Microbiology Results       Procedure Component Value Units Date/Time    Culture, Anaerobic [[id number]]  (Abnormal) Collected: 12/28/21 1030    Order Status: Completed Specimen: Swab from Sacrum Updated: 12/30/21 1137     Special Requests NO SPECIAL REQUESTS        Culture       LIGHT MIXED ANAEROBIC GRAM NEGATIVE RODS          Culture, Wound Aerobic Only [[id number]]  (Abnormal)  (Susceptibility) Collected:  12/28/21 1030    Order Status: Completed Specimen: Swab from Sacrum Updated: 12/31/21 0733     Special Requests NO SPECIAL REQUESTS        Gram stain RARE WBC'S         NO ORGANISMS SEEN        Culture FEW Proteus mirabilis         FEW Enterococcus faecalis       Susceptibility  Proteus mirabilis      BACTERIAL SUSCEPTIBILITY PANEL MIC      amikacin <=2 ug/mL Sensitive      ampicillin <=2 ug/mL Sensitive      ampicillin-sulbactam <=2 ug/mL Sensitive      ceFAZolin <=4 ug/mL Sensitive      cefepime <=1 ug/mL Sensitive      cefOXitin <=4 ug/mL Sensitive      cefTAZidime <=1 ug/mL Sensitive      cefTRIAXone <=1 ug/mL Sensitive      ciprofloxacin <=0.25 ug/mL Sensitive      gentamicin <=1 ug/mL Sensitive      levofloxacin <=0.12 ug/mL Sensitive      meropenem <=0.25 ug/mL Sensitive      piperacillin-tazobactam <=4 ug/mL Sensitive      tobramycin <=1 ug/mL Sensitive      trimethoprim-sulfamethoxazole <=20 ug/mL Sensitive                       Susceptibility        Enterococcus faecalis      BACTERIAL SUSCEPTIBILITY PANEL MIC      ampicillin <=2 ug/mL Resistant      DAPTOmycin 2 ug/mL Sensitive      linezolid 2 ug/mL Sensitive      vancomycin 1 ug/mL Sensitive                           Culture, Blood 2 [[id number]] Collected: 12/28/21 0435    Order Status: Completed Specimen: Blood Updated: 01/01/22 0617     Special Requests --        NO SPECIAL REQUESTS  RIGHT  HAND       Culture NO GROWTH 4 DAYS       Culture, Blood 1 [[id number]] Collected: 12/28/21 0432    Order Status: Completed Specimen: Blood Updated: 01/01/22 0617     Special Requests --        NO SPECIAL REQUESTS  LEFT  HAND       Culture NO GROWTH 4 DAYS       Culture, Wound Aerobic Only [[id number]]  (Abnormal)  (Susceptibility) Collected: 12/28/21 0000    Order Status: Completed Specimen: Sacrum Updated: 01/01/22 0640     Special Requests NO SPECIAL REQUESTS        Gram stain OCCASIONAL WBCS SEEN               2+ APPARENT Gram negative rods                   1+ Gram positive cocci IN PAIRS           Culture       LIGHT PSEUDOMONAS AERUGINOSA                  MODERATE MIXED ENTERIC FLORA          Susceptibility        Pseudomonas aeruginosa      BACTERIAL SUSCEPTIBILITY PANEL MIC      amikacin <=2 ug/mL Sensitive      cefepime <=1 ug/mL Sensitive      cefTAZidime 4 ug/mL Sensitive      ciprofloxacin <=0.25 ug/mL Sensitive      gentamicin <=1 ug/mL Sensitive      levofloxacin 0.5 ug/mL Sensitive      meropenem <=0.25 ug/mL Sensitive      piperacillin-tazobactam <=4 ug/mL Sensitive      tobramycin <=1 ug/mL Sensitive  RADIOLOGY:    All available imaging studies/reports in connect care for this admission were reviewed      Disclaimer: Sections of this note are dictated utilizing voice recognition software, which may have resulted in some phonetic based errors in grammar and contents. Even though attempts were made to correct all the mistakes, some may have been missed, and remained in the body of the document. If questions arise, please contact our department.    Dr. Raiford Simmonds, Infectious Disease Specialist  [phone]  Jan 01, 2022  9:02 AM

## 2022-01-01 NOTE — Plan of Care (Signed)
Problem: Physical Therapy - Adult  Goal: By Discharge: Performs mobility at highest level of function for planned discharge setting.  See evaluation for individualized goals.  Description: Physical Therapy Goals:  Initiated 12/30/2021 to be met within 7-10 days.    1.  Patient will move from supine to sit and sit to supine  and scoot up and down in bed with minimal assistance/contact guard assist.    2.  Patient will transfer from bed to chair and chair to bed with moderate assistance  using the least restrictive device.  3.  Patient will perform sit to stand with moderate assistance .  4.  Patient will ambulate with moderate assistance  for 5 feet with the least restrictive device.     PLOF: Per daughter, pt was ambulating with RW; however, questionable at this time when this was given pt's current sacral wound.      Outcome: Progressing   PHYSICAL THERAPY TREATMENT    Patient: [name] Short (66 y.o. female)  Date: 01/01/2022  Diagnosis: Failure to thrive in adult [R62.7]  Fall, initial encounter [W19.XXXA]  Fall at home, initial encounter [id number], Y92.009]  Cerebrovascular accident (CVA), unspecified mechanism ([place]) [I63.9] Fall  Procedure(s) (LRB):  SACRAL WOUND DEBRIDEMENT INCISION AND DRAINAGE (N/A) 4 Days Post-Op  Precautions: Fall Risk   Patient Goals : to not be in pain    ASSESSMENT:  Pt cleared to participate in PT session, pt received semi-reclined in bed and agreeable to therapy session with encouragement and daughter at bedside. Completing with OT to maximize safety and mobility. Pt transitioning to EOB with mod-maxA, increased pain in buttocks area, wound vac not working. Pt with good sitting balance at EOB, standing x3 reps with minA to RW, able to take slow shuffled side steps towards HOB. Pt returned to supine in bed with modA. Scooting towards HOB with totalA. Pt positioned for comfort and educated to call for assist before getting up, pt verbalized understanding. Pt left with all needs met and  call bell in reach. RN notified of position and participation.       Progression toward goals:   []       Improving appropriately and progressing toward goals  [x]       Improving slowly and progressing toward goals  []       Not making progress toward goals and plan of care will be adjusted     PLAN:  Patient continues to benefit from skilled intervention to address the above impairments.  Continue treatment per established plan of care.    Further Equipment Recommendations for Discharge: rolling walker    AMPAC: AM-PAC Inpatient Mobility Raw Score : 14        Current research shows that an AM-PAC score of 17 (13 without stairs) or less is not associated with a discharge to the patient's home setting. Based on an AM-PAC score and their current functional mobility deficits, it is recommended that the patient have 3-5 sessions per week of Physical Therapy at d/c to increase the patient's independence.         This AMPAC score should be considered in conjunction with interdisciplinary team recommendations to determine the most appropriate discharge setting. Patient's social support, diagnosis, medical stability, and prior level of function should also be taken into consideration.     SUBJECTIVE:   Patient stated, "They are changing my wound vac today."    OBJECTIVE DATA SUMMARY:   Critical Behavior:  Orientation  Overall Orientation Status: Within Normal Limits  Orientation  Level: Oriented X4       Functional Mobility Training:  Bed Mobility:  Bed Mobility Training  Rolling: Moderate assistance  Supine to Sit: Moderate assistance;Maximum assistance  Sit to Supine: Moderate assistance  Scooting: Total assistance  Transfers:  Pharmacologist: Yes  Sit to Stand: Minimum assistance  Stand to Sit: Minimum assistance  Balance:  Balance  Sitting: Intact  Standing: With support  Standing - Static: Fair  Standing - Dynamic: Fair     Ambulation/Gait Training:     Gait  Speed/Cadence: Slow;Shuffled  Step  Length: Left shortened;Right shortened  Gait Abnormalities: Decreased step clearance  Distance (ft): 2 Feet (side steps towards HOB)  Assistive Device: Walker, rolling  Pain:  Pain level pre-treatment: 0/10  Pain level post-treatment: 0/10   FLACC    Activity Tolerance:   Activity Tolerance: Patient limited by pain  Please refer to the flowsheet for vital signs taken during this treatment.  After treatment:   []  Patient left in no apparent distress sitting up in chair  [x]  Patient left in no apparent distress in bed  [x]  Call bell left within reach  [x]  Nursing notified  []  Caregiver present  []  Bed alarm activated  []  SCDs applied      COMMUNICATION/EDUCATION:   Patient Education  Education Given To: Patient;Family  Education Provided: Role of Therapy;Plan of Care;Transfer Training;Equipment;Fall Prevention Strategies;Energy Conservation  Education Method: Demonstration;Verbal  Barriers to Learning: None  Education Outcome: Continued education needed      Para March, PT  Minutes: 31

## 2022-01-01 NOTE — Progress Notes (Signed)
[facility]   Pharmacy Pharmacokinetic Monitoring Service - Vancomycin    Indication: Skin and Soft Tissue Infection  Target Concentration: Goal AUC/MIC 400-600 mg*hr/L  Day of Therapy: 7  Additional Antimicrobials: Piperacillin/Tazobactam    Pertinent Laboratory Values:   Temp: 98.1 F (36.7 C), Weight - Scale: 144 lb (65.3 kg)  Recent Labs     12/31/21  0029 01/01/22  0505   CREATININE 0.82 0.66   BUN 24* 23*       Estimated Creatinine Clearance: 76 mL/min (based on SCr of 0.66 mg/dL).    Pertinent Cultures:  Culture Date Source Results   5/28  Wound (sacrum) E faecalis (ampicillin resistant);  Proteus;   Pseudomonas    MRSA Nasal Swab: N/A. Non-respiratory infection    Assessment:  Date/Time Current Dose Concentration Timing of Concentration (h) AUC   5/26 750 mg q12h - - -   5/28 750 mg q12h 19.1 3.25h -   5/29 750 mg q12h - - -   5/30 750 mg q12h 14.1 10.5 436   5/31 750 mg q12h - - 363     Note: Serum concentrations collected for AUC dosing may appear elevated if collected in close proximity to the dose administered, this is not necessarily an indication of toxicity    Plan:  Current dose is subtherapeutic with AUC < 400  Will increase to Vancomycin 1000 mg q12h for est AUC/tr = 482/14.1  BMP for tomorrow AM   No level ordered at this time   Pharmacy will continue to monitor patient and adjust therapy as indicated    Thank you for the consult,  Christen Butter, [facility]  01/01/2022

## 2022-01-01 NOTE — Progress Notes (Signed)
[facility] Hospitalist Group  Progress Note    Patient: [name] Short Age: 66 y.o. DOB: 17-Dec-1955 MR#: [id number] SSN: [id number]  Date: 01/01/2022       Subjective/24-hour events:     Comfortable currently, no new issues overnight.  Updated daughter at bedside.   ARU has started insurance auth with [employer].     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  Wound VAC to be placed.  Lantus 30 units daily.  Continue SSI.  Further adjustment as necessary based on glycemic control going forward.  Potassium in normal range today.  Monitor and replace further as necessary.  Pain control, bowel regimen.  Neuro following.   ID following - on zosyn, vanc. To have Picc line placed for IV antibiotics when discharge plan is clear. Awaiting to see if [employer] approves ARU placement.   PT/OT as tolerated.    Continue supportive care otherwise.      Case discussed with:  [x] Patient  [x] Family  [x]  Nursing  [] Case Management  DVT Prophylaxis:  [] Lovenox  [] Hep SQ  [x] SCDs  [] Coumadin   [] On Heparin gtt [] PO anticoagulant    Objective:   VS: BP (!) 120/55   Pulse 77   Temp 97.9 F (36.6 C) (Oral)   Resp 18   Ht 4\' 11"  (1.499 m)   Wt 144 lb (65.3 kg)   SpO2 97%   BMI 29.08 kg/m      Tmax/24hrs: Temp (24hrs), Avg:98.9 F (37.2 C), Min:97.9 F (36.6 C), Max:99.8 F (37.7 C)    Intake/Output Summary (Last 24 hours) at 01/01/2022 1444  Last data filed at 01/01/2022 1300  Gross per 24 hour   Intake 700 ml   Output 1125 ml   Net -425 ml         Gen:  Appears uncomfortable but In NAD.  Lungs: Clear, no wheezes  Effort nonlabored.  CV: RRR.  Abdomen: Soft,  NTTP.  Extremities: Warm, no pitting edema or ischemia.  Neuro:  Awake and alert.    Current Facility-Administered Medications   Medication Dose Route Frequency    vancomycin (VANCOCIN) 1,000 mg in sodium chloride 0.9 % 250 mL (vial-mate) IVPB  1,000 mg IntraVENous Q12H    aspirin chewable tablet 81 mg  81 mg Oral Daily    acetaminophen (TYLENOL) tablet 500 mg  500 mg Oral Q4H PRN    insulin glargine (LANTUS) injection vial 30 Units  30 Units SubCUTAneous Daily    atorvastatin (LIPITOR) tablet 80 mg  80 mg Oral Nightly    lactated ringers IV soln infusion   IntraVENous Continuous    HYDROcodone-acetaminophen (NORCO) 5-325 MG per tablet 1 tablet  1 tablet Oral Q4H PRN    furosemide (LASIX) tablet 40 mg  40 mg Oral Daily    sodium chloride flush 0.9 % injection 5-40 mL  5-40 mL IntraVENous 2 times per day    sodium chloride flush 0.9 % injection 5-40 mL  5-40 mL IntraVENous PRN    0.9 % sodium chloride infusion   IntraVENous PRN    potassium chloride 10 mEq/100 mL IVPB (Peripheral Line)  10 mEq IntraVENous PRN    magnesium sulfate 2000 mg in 50 mL IVPB premix  2,000 mg IntraVENous PRN    ondansetron (ZOFRAN-ODT) disintegrating tablet 4 mg  4 mg Oral Q8H PRN    Or    ondansetron (ZOFRAN) injection 4 mg  4 mg IntraVENous Q6H PRN    polyethylene glycol (GLYCOLAX) packet 17 g  17 g Oral Daily PRN    acetaminophen (TYLENOL) suppository 650 mg  650 mg Rectal Q6H PRN    piperacillin-tazobactam (ZOSYN) 3,375 mg in sodium chloride 0.9 % 50 mL IVPB (mini-bag)  3,375 mg IntraVENous Q8H    glucose chewable tablet 16 g  4 tablet Oral PRN    dextrose bolus 10% 125 mL  125 mL IntraVENous PRN    Or    dextrose bolus 10% 250 mL  250 mL IntraVENous PRN    glucagon (rDNA) injection 1 mg  1 mg SubCUTAneous PRN    dextrose 10 % infusion   IntraVENous Continuous PRN    insulin lispro (HUMALOG) injection vial 0-8 Units  0-8 Units SubCUTAneous TID WC    insulin lispro (HUMALOG) injection vial 0-4 Units  0-4 Units SubCUTAneous Nightly         Labs:    Recent Results (from the past 24 hour(s))   POCT Glucose    Collection Time: 12/31/21  4:19 PM   Result Value Ref Range    POC Glucose 341 (H) 70 - 110 mg/dL   POCT Glucose    Collection Time: 12/31/21  4:21 PM   Result Value Ref Range    POC Glucose 291 (H) 70 - 110 mg/dL   POCT Glucose    Collection Time: 12/31/21 10:05 PM   Result Value Ref Range    POC Glucose 200 (H) 70 - 110 mg/dL   Basic Metabolic Panel    Collection Time: 01/01/22  5:05 AM   Result Value Ref Range    Sodium 141 136 - 145 mmol/L    Potassium 3.8 3.5 - 5.5 mmol/L    Chloride 107 100 - 111 mmol/L    CO2 30 21 - 32 mmol/L    Anion Gap 4 3.0 - 18 mmol/L    Glucose 112 (H) 74 - 99 mg/dL    BUN 23 (H) 7.0 - 18 MG/DL    Creatinine 9.92 0.6 - 1.3 MG/DL    Bun/Cre Ratio 35 (H) 12 - 20      Est, Glom Filt Rate >60 >60 ml/min/1.4m2    Calcium 10.7 (H) 8.5 - 10.1 MG/DL   POCT Glucose    Collection Time: 01/01/22  7:58 AM   Result Value Ref Range    POC Glucose 133 (H) 70 - 110 mg/dL   POCT Glucose    Collection Time: 01/01/22 11:14 AM   Result Value Ref Range    POC Glucose 180 (H) 70 - 110 mg/dL         Signed By: Henrietta Hoover, DO     Jan 01, 2022

## 2022-01-01 NOTE — Progress Notes (Signed)
Patient wound vac dressing change per wound care order. Patient tolerated well.

## 2022-01-02 ENCOUNTER — Encounter: Payer: PRIVATE HEALTH INSURANCE | Primary: Registered Nurse

## 2022-01-02 ENCOUNTER — Encounter: Payer: PRIVATE HEALTH INSURANCE | Primary: Physician Assistant

## 2022-01-02 LAB — BASIC METABOLIC PANEL
Anion Gap: 5 mmol/L (ref 3.0–18)
BUN/Creatinine Ratio: 39 — ABNORMAL HIGH (ref 12–20)
BUN: 30 mg/dL — ABNORMAL HIGH (ref 7.0–18)
CO2: 29 mmol/L (ref 21–32)
Calcium: 10.7 mg/dL — ABNORMAL HIGH (ref 8.5–10.1)
Chloride: 105 mmol/L (ref 100–111)
Creatinine: 0.76 mg/dL (ref 0.6–1.3)
Est, Glom Filt Rate: >60 mL/min/{1.73_m2} (ref >60)
Glucose: 204 mg/dL — ABNORMAL HIGH (ref 74–99)
Potassium: 3.6 mmol/L (ref 3.5–5.5)
Sodium: 139 mmol/L (ref 136–145)

## 2022-01-02 LAB — POCT GLUCOSE
POC Glucose: 280 mg/dL — ABNORMAL HIGH (ref 70–110)
POC Glucose: 179 mg/dL — ABNORMAL HIGH (ref 70–110)
POC Glucose: 207 mg/dL — ABNORMAL HIGH (ref 70–110)
POC Glucose: 293 mg/dL — ABNORMAL HIGH (ref 70–110)

## 2022-01-02 MED ORDER — THERAPEUTIC MULTIVIT/MINERAL PO TABS
Freq: Every day | ORAL | Status: AC
Start: 2022-01-02 — End: 2022-01-06
  Administered 2022-01-02 – 2022-01-06 (×5): 1 via ORAL

## 2022-01-02 MED ORDER — INSULIN GLARGINE 100 UNIT/ML SC SOLN
100 UNIT/ML | Freq: Every day | SUBCUTANEOUS | Status: AC
Start: 2022-01-02 — End: 2022-01-03
  Administered 2022-01-03: 14:00:00 33 [IU] via SUBCUTANEOUS

## 2022-01-02 MED ORDER — ATORVASTATIN CALCIUM 40 MG PO TABS: 40.0000 mg | ORAL_TABLET | Freq: Every day | ORAL | 3 refills | Status: DC

## 2022-01-02 MED FILL — VANCOMYCIN HCL 1 G IV SOLR: 1 g | INTRAVENOUS | Qty: 1000

## 2022-01-02 MED FILL — HYDROCODONE-ACETAMINOPHEN 5-325 MG PO TABS: 5-325 MG | ORAL | Qty: 1

## 2022-01-02 MED FILL — THERAPEUTIC-M PO TABS: ORAL | Qty: 1 | Fill #0

## 2022-01-02 MED FILL — LANTUS 100 UNIT/ML SC SOLN: 100 UNIT/ML | SUBCUTANEOUS | Qty: 1

## 2022-01-02 MED FILL — ASPIRIN LOW DOSE 81 MG PO CHEW: 81 MG | ORAL | Qty: 1

## 2022-01-02 MED FILL — FUROSEMIDE 40 MG PO TABS: 40 MG | ORAL | Qty: 1

## 2022-01-02 MED FILL — PIPERACILLIN SOD-TAZOBACTAM SO 3.375 (3-0.375) G IV SOLR: 3.375 (3-0.375) g | INTRAVENOUS | Qty: 3375

## 2022-01-02 MED FILL — HUMALOG 100 UNIT/ML IJ SOLN: 100 UNIT/ML | INTRAMUSCULAR | Qty: 1

## 2022-01-02 MED FILL — ATORVASTATIN CALCIUM 40 MG PO TABS: 40 MG | ORAL | Qty: 2

## 2022-01-02 NOTE — Plan of Care (Signed)
Problem: Safety - Adult  Goal: Free from fall injury  Outcome: Progressing  Flowsheets (Taken 01/01/2022 1130 by Jeraldine Loots, RN)  Free From Fall Injury: Instruct family/caregiver on patient safety     Problem: Skin/Tissue Integrity  Goal: Absence of new skin breakdown  Description: 1.  Monitor for areas of redness and/or skin breakdown  2.  Assess vascular access sites hourly  3.  Every 4-6 hours minimum:  Change oxygen saturation probe site  4.  Every 4-6 hours:  If on nasal continuous positive airway pressure, respiratory therapy assess nares and determine need for appliance change or resting period.  Outcome: Progressing     Problem: ABCDS Injury Assessment  Goal: Absence of physical injury  Outcome: Progressing  Flowsheets (Taken 01/01/2022 1130 by Melina Bias, RN)  Absence of Physical Injury: Implement safety measures based on patient assessment     Problem: Chronic Conditions and Co-morbidities  Goal: Patient's chronic conditions and co-morbidity symptoms are monitored and maintained or improved  Outcome: Progressing     Problem: Pain  Goal: Verbalizes/displays adequate comfort level or baseline comfort level  Outcome: Progressing     Problem: Nutrition Deficit:  Goal: Optimize nutritional status  Outcome: Progressing     Problem: Neurosensory - Adult  Goal: Absence of seizures  Outcome: Progressing     Problem: Respiratory - Adult  Goal: Achieves optimal ventilation and oxygenation  Outcome: Progressing     Problem: Cardiovascular - Adult  Goal: Maintains optimal cardiac output and hemodynamic stability  Outcome: Progressing     Problem: Skin/Tissue Integrity - Adult  Goal: Incisions, wounds, or drain sites healing without S/S of infection  Outcome: Progressing  Flowsheets (Taken 01/01/2022 1130 by Melina Bias, RN)  Incisions, Wounds, or Drain Sites Healing Without Sign and Symptoms of Infection: Implement wound care per orders  Goal: Oral mucous membranes remain intact  Outcome: Progressing  Flowsheets  (Taken 01/01/2022 1130 by Melina Bias, RN)  Oral Mucous Membranes Remain Intact: Assess oral mucosa and hygiene practices     Problem: Musculoskeletal - Adult  Goal: Return mobility to safest level of function  Outcome: Progressing  Goal: Return ADL status to a safe level of function  Outcome: Progressing     Problem: Gastrointestinal - Adult  Goal: Minimal or absence of nausea and vomiting  Outcome: Progressing  Goal: Maintains or returns to baseline bowel function  Outcome: Progressing  Goal: Maintains adequate nutritional intake  Outcome: Progressing     Problem: Genitourinary - Adult  Goal: Absence of urinary retention  Outcome: Progressing     Problem: Infection - Adult  Goal: Absence of infection at discharge  Outcome: Progressing     Problem: Metabolic/Fluid and Electrolytes - Adult  Goal: Electrolytes maintained within normal limits  Outcome: Progressing     Problem: Hematologic - Adult  Goal: Maintains hematologic stability  Outcome: Progressing     Problem: Occupational Therapy - Adult  Goal: By Discharge: Performs self-care activities at highest level of function for planned discharge setting.  See evaluation for individualized goals.  Description: Occupational Therapy Goals:  Initiated 12/30/2021 to be met within 7-10 days.    1.  Patient will sit EOB with moderate assistance in order to eat meals while sitting up.   2.  Patient will complete bed mobility to roll in order to participate in hygiene needs with moderate assistance .  3.  Patient will perform bathing  with moderate assistance .  4.  Patient will perform toilet transfers with moderate assistance .  5.  Patient will perform all aspects of toileting with moderate assistance .  6.  Patient will participate in upper extremity therapeutic exercise/activities with minimal assistance/contact guard assist for 8-10 minutes to increase strength/endurance for ADLs.    7.  Patient will utilize energy conservation techniques during functional activities  with verbal, visual, and tactile cues.    PLOF: Pt lives in a single story home with her dtr. Reports aides assist with ADLs a few hours a day, 2-3 days a week.      01/01/2022 1144 by Stephani Police Poyser, OTA  Outcome: Progressing     Problem: Physical Therapy - Adult  Goal: By Discharge: Performs mobility at highest level of function for planned discharge setting.  See evaluation for individualized goals.  Description: Physical Therapy Goals:  Initiated 12/30/2021 to be met within 7-10 days.    1.  Patient will move from supine to sit and sit to supine  and scoot up and down in bed with minimal assistance/contact guard assist.    2.  Patient will transfer from bed to chair and chair to bed with moderate assistance  using the least restrictive device.  3.  Patient will perform sit to stand with moderate assistance .  4.  Patient will ambulate with moderate assistance  for 5 feet with the least restrictive device.     PLOF: Per daughter, pt was ambulating with RW; however, questionable at this time when this was given pt's current sacral wound.      01/01/2022 1128 by Para March, PT  Outcome: Progressing

## 2022-01-02 NOTE — Progress Notes (Signed)
ARU/IPR REFERRAL CONTACT NOTE  [facility]    RE:  [place], [place]        Comments: Spoke with Rock Nephew at [facility]. Confirmed case is pending review by medical director and anticipate decision by [facility] today.    Please be advised admission to ARU will be based on meeting admission requirements, authorization from [facility]      [    Thank you for this referral.   Please do not hesitate to call if you need further information or have additional questions.     Regards,    Arrie Senate PTA   Admissions [facility] for Physical Rehabilitation  [phone]

## 2022-01-02 NOTE — Progress Notes (Signed)
[facility] Hospitalist Group  Progress Note    Patient: [name] Short Age: 66 y.o. DOB: 07-08-1956 MR#: [id number] SSN: [id number]  Date: 01/02/2022       Subjective/24-hour events:     Patient lying in the bed, feels fine.  No new complaints.    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  Wound VAC to be placed.  Continue Lantus 30 units daily.  Continue SSI.    Pain control, bowel regimen.  Neuro following.   ID following - on zosyn, vanc. To have Picc line placed for IV antibiotics when discharge plan is clear. Awaiting to see if [employer] approves ARU placement.   PT/OT as tolerated.    Continue supportive care otherwise.      Discussed with case management, awaiting for ARU authorization    Discussed with the patient at bedside and explained about my above plan care.    Case discussed with:  [x] Patient  [x] Family  [x]  Nursing  [] Case Management  DVT Prophylaxis:  [] Lovenox  [] Hep SQ  [x] SCDs  [] Coumadin   [] On Heparin gtt [] PO anticoagulant    Objective:   VS: BP 111/62   Pulse 74   Temp 98.6 F (37 C) (Oral)   Resp 18   Ht 4\' 11"  (1.499 m)   Wt 144 lb (65.3 kg)   SpO2 97%   BMI 29.08 kg/m      Tmax/24hrs: Temp (24hrs), Avg:98.5 F (36.9 C), Min:97.9 F (36.6 C), Max:99.1 F (37.3 C)    Intake/Output Summary (Last 24 hours) at 01/02/2022 1758  Last data filed at 01/02/2022  Gross per 24 hour   Intake 2630 ml   Output 3800 ml   Net -1170 ml       Gen:  Appears uncomfortable but In NAD.  Lungs: Clear, no wheezes  Effort nonlabored.  CV: RRR.  Abdomen: Soft, NTTP.  Extremities: Warm, no pitting edema or ischemia.  Neuro:  Awake and alert.  Oriented  x3  Back dressing with wound VAC in place    Current Facility-Administered Medications   Medication Dose Route Frequency    therapeutic multivitamin-minerals 1 tablet  1 tablet Oral Daily    [START ON 01/03/2022] insulin glargine (LANTUS) injection vial 33 Units  33 Units SubCUTAneous Daily    vancomycin (VANCOCIN) 1,000 mg in sodium chloride 0.9 % 250 mL (vial-mate) IVPB  1,000 mg IntraVENous Q12H    aspirin chewable tablet 81 mg  81 mg Oral Daily    acetaminophen (TYLENOL) tablet 500 mg  500 mg Oral Q4H PRN    atorvastatin (LIPITOR) tablet 80 mg  80 mg Oral Nightly    lactated ringers IV soln infusion   IntraVENous Continuous    HYDROcodone-acetaminophen (NORCO) 5-325 MG per tablet 1 tablet  1 tablet Oral Q4H PRN    furosemide (LASIX) tablet 40 mg  40 mg Oral Daily    sodium chloride flush 0.9 % injection 5-40 mL  5-40 mL IntraVENous 2 times per day    sodium chloride flush 0.9 % injection 5-40 mL  5-40 mL IntraVENous PRN    0.9 % sodium chloride  infusion   IntraVENous PRN    potassium chloride 10 mEq/100 mL IVPB (Peripheral Line)  10 mEq IntraVENous PRN    magnesium sulfate 2000 mg in 50 mL IVPB premix  2,000 mg IntraVENous PRN    ondansetron (ZOFRAN-ODT) disintegrating tablet 4 mg  4 mg Oral Q8H PRN    Or    ondansetron (ZOFRAN) injection 4 mg  4 mg IntraVENous Q6H PRN    polyethylene glycol (GLYCOLAX) packet 17 g  17 g Oral Daily PRN    acetaminophen (TYLENOL) suppository 650 mg  650 mg Rectal Q6H PRN    piperacillin-tazobactam (ZOSYN) 3,375 mg in sodium chloride 0.9 % 50 mL IVPB (mini-bag)  3,375 mg IntraVENous Q8H    glucose chewable tablet 16 g  4 tablet Oral PRN    dextrose bolus 10% 125 mL  125 mL IntraVENous PRN    Or    dextrose bolus 10% 250 mL  250 mL IntraVENous PRN    glucagon (rDNA) injection 1 mg  1 mg SubCUTAneous PRN    dextrose 10 % infusion   IntraVENous Continuous PRN    insulin lispro (HUMALOG) injection vial 0-8 Units  0-8 Units SubCUTAneous TID WC    insulin lispro (HUMALOG) injection vial  0-4 Units  0-4 Units SubCUTAneous Nightly        Labs:    Recent Results (from the past 24 hour(s))   POCT Glucose    Collection Time: 01/01/22  8:40 PM   Result Value Ref Range    POC Glucose 280 (H) 70 - 110 mg/dL   Basic Metabolic Panel    Collection Time: 01/02/22  2:09 AM   Result Value Ref Range    Sodium 139 136 - 145 mmol/L    Potassium 3.6 3.5 - 5.5 mmol/L    Chloride 105 100 - 111 mmol/L    CO2 29 21 - 32 mmol/L    Anion Gap 5 3.0 - 18 mmol/L    Glucose 204 (H) 74 - 99 mg/dL    BUN 30 (H) 7.0 - 18 MG/DL    Creatinine 5.40 0.6 - 1.3 MG/DL    Bun/Cre Ratio 39 (H) 12 - 20      Est, Glom Filt Rate >60 >60 ml/min/1.30m2    Calcium 10.7 (H) 8.5 - 10.1 MG/DL   POCT Glucose    Collection Time: 01/02/22  8:01 AM   Result Value Ref Range    POC Glucose 179 (H) 70 - 110 mg/dL   POCT Glucose    Collection Time: 01/02/22 11:37 AM   Result Value Ref Range    POC Glucose 207 (H) 70 - 110 mg/dL   POCT Glucose    Collection Time: 01/02/22  4:25 PM   Result Value Ref Range    POC Glucose 293 (H) 70 - 110 mg/dL         Signed By: Woodward Ku, MD     January 02, 2022

## 2022-01-02 NOTE — Plan of Care (Signed)
Problem: Occupational Therapy - Adult  Goal: By Discharge: Performs self-care activities at highest level of function for planned discharge setting.  See evaluation for individualized goals.  Description: Occupational Therapy Goals:  Initiated 12/30/2021 to be met within 7-10 days.    1.  Patient will sit EOB with moderate assistance in order to eat meals while sitting up.   2.  Patient will complete bed mobility to roll in order to participate in hygiene needs with moderate assistance .  3.  Patient will perform bathing  with moderate assistance .  4.  Patient will perform toilet transfers with moderate assistance .  5.  Patient will perform all aspects of toileting with moderate assistance .  6.  Patient will participate in upper extremity therapeutic exercise/activities with minimal assistance/contact guard assist for 8-10 minutes to increase strength/endurance for ADLs.    7.  Patient will utilize energy conservation techniques during functional activities with verbal, visual, and tactile cues.    PLOF: Pt lives in a single story home with her dtr. Reports aides assist with ADLs a few hours a day, 2-3 days a week.      Outcome: Progressing   OCCUPATIONAL THERAPY TREATMENT    Patient: [name] Short (66 y.o. female)  Date: 01/02/2022  Diagnosis: Failure to thrive in adult [R62.7]  Fall, initial encounter [W19.XXXA]  Fall at home, initial encounter [phone], S56.812]  Cerebrovascular accident (CVA), unspecified mechanism ([place]) [I63.9] Fall  Procedure(s) (LRB):  SACRAL WOUND DEBRIDEMENT INCISION AND DRAINAGE (N/A) 5 Days Post-Op  Precautions: Fall Risk    Chart, occupational therapy assessment, plan of care, and goals were reviewed.  ASSESSMENT:  Pt sleeping upon entry. Arouses to verbal/tactile stimuli. Agreeable to EOB activity. Pt requires MAX A w/bed mobility 2/2 generalized weakness. Pt tolerates ~ 10 minutes at EOB w/close guarding performing UE Therex and ADL grooming tasks. Pt w/poor posture, requiring  verbal/tactile cues for cervical extension and bilateral scapula retraction for improved static sitting balance. Reviewed energy conservation techniques and importance OOB. Plan OOB following session w/2 person assist.     Progression toward goals:  []           Improving appropriately and progressing toward goals  [x]           Improving slowly and progressing toward goals  []           Not making progress toward goals and plan of care will be adjusted     PLAN:  Patient continues to benefit from skilled intervention to address the above impairments.  Continue treatment per established plan of care.    Further Equipment Recommendations for Discharge: shower chair and rolling walker    AMPAC: AM-PAC Inpatient Daily Activity Raw Score: 14    Current research shows that an AM-PAC score of 17 or less is not associated with a discharge to the patient's home setting. Based on an AM-PAC score and their current ADL deficits; it is recommended that the patient have 5-7 sessions per week of Occupational Therapy at d/c to increase the patient's independence.  Currently, this patient demonstrates the potential endurance, and/or tolerance for 3 hours of therapy each day at d/c.        This AMPAC score should be considered in conjunction with interdisciplinary team recommendations to determine the most appropriate discharge setting. Patient's social support, diagnosis, medical stability, and prior level of function should also be taken into consideration.     SUBJECTIVE:   Patient stated, "I could get up and take steps  at home."    OBJECTIVE DATA SUMMARY:   Cognitive/Behavioral Status:  Orientation  Orientation Level: Oriented X4    Functional Mobility and Transfers for ADLs:   Bed Mobility:  Bed Mobility Training  Rolling: Moderate assistance  Supine to Sit: Additional time;Maximum assistance  Sit to Supine: Maximum assistance     Balance:  Balance  Sitting: Impaired  Sitting - Static: Fair (occasional)  Sitting - Dynamic: Fair  (occasional)    ADL Intervention:  Grooming: Stand by assistance  Oral care seated EOB    UE Therapeutic Exercises:   AROM BUE hand squeezes  AROM BUE elbow flexion/extension  AROM cervical extension to neutral  AROM BUE scapula retraction    Pain:  Pain level pre-treatment: 7/10   Pain level post-treatment: 7/10  Pt c/o sacral and cervical pain.  Response to intervention: Nurse notified, Melina    Activity Tolerance:    Fair 2/2 c/o pain    After treatment:   []   Patient left in no apparent distress sitting up in chair  [x]   Patient left in no apparent distress in bed  [x]   Call bell left within reach  [x]   Nursing notified  [x]   Caregiver present  []   Bed alarm activated    COMMUNICATION/EDUCATION:   Patient Education  Education Given To: Patient;Family  Education Provided: Role of Therapy;Plan of Care;ADL Adaptive Strategies;Energy Conservation  Education Method: Verbal;Teach Back  Barriers to Learning: None  Education Outcome: Continued education needed    Thank you for this referral.  Seung Nidiffer, OTA  Minutes: 31

## 2022-01-02 NOTE — Progress Notes (Signed)
Infectious Disease progress  Note        Reason: Infected sacral decubiti    Current abx Prior abx   Zosyn, vancomycin since 5/26      Lines:   Picc line LUE since 12/30/21    Assessment :   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     Cervical spondylotic myelopathy: s/p  C3, C4, C5, C6 laminectomy; posterior cervical fusion C3, C4, C5, C6; segmental instrumentation, [employer] type, C3, C4, C5, C6 with lateral mass screws on 12/09/21    Clinical presentation c/w infected necrotic sacral decubiti.    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     Recommendations:    Continue zosyn, vancomycin till 02/06/22  Follow-up surgery recommendations regarding wound care of sacrum  Follow-up neurology/spine surgery recommendations given lower extremity weakness  Needs weekly cbc with diff, renal function panel while on iv antibiotics to monitor for antibiotic side effects     Above plan was discussed in details with patient, daughter over the phone and dr. Sherlynn Stalls. Please call me if any further questions or concerns. Will continue to participate in the care of this patient.    HPI:     no new complaints. Patient denies any subjective fever, chills at home.  She denies any increasing chest pain, shortness of breath, neck pain.  Past Medical History:   Diagnosis Date    Anemia     Arthritis     Chronic pain     Legs and Shoulders    Diabetes (HCC)     Exposure to asbestos     History of blood transfusion     History of colon polyps     HTN (hypertension)      Hx of blood clots     DVT    Hyperlipemia     Menopause     Pulmonary nodule     Serum calcium elevated     Sleep apnea     not using cpap    Stroke [facility])        Past Surgical History:   Procedure Laterality Date    CESAREAN SECTION  1987    COLONOSCOPY N/A 01/13/2017    COLONOSCOPY performed by Levan Hurst, MD at [facility]COPY    COLONOSCOPY      DILATION AND CURETTAGE OF UTERUS  1995    RECTAL SURGERY N/A 12/28/2021    SACRAL WOUND DEBRIDEMENT INCISION AND DRAINAGE performed by Phineas Semen, DO at [facility] MAIN OR    SPINE SURGERY N/A 12/09/2021    CERVICAL THREE/FOUR/FIVE/SIX LAMINECTOMY FUSION; C-ARM; STRYKER; EXT BONE GROWTH STIM; 23 HR performed by Pamalee Leyden, MD at [facility] MAIN OR       @    Current Facility-Administered Medications   Medication Dose Route Frequency    vancomycin (VANCOCIN) 1,000 mg in sodium chloride 0.9 % 250 mL (vial-mate) IVPB  1,000 mg IntraVENous Q12H    aspirin chewable tablet 81 mg  81 mg Oral Daily    acetaminophen (TYLENOL)  tablet 500 mg  500 mg Oral Q4H PRN    insulin glargine (LANTUS) injection vial 30 Units  30 Units SubCUTAneous Daily    atorvastatin (LIPITOR) tablet 80 mg  80 mg Oral Nightly    lactated ringers IV soln infusion   IntraVENous Continuous    HYDROcodone-acetaminophen (NORCO) 5-325 MG per tablet 1 tablet  1 tablet Oral Q4H PRN    furosemide (LASIX) tablet 40 mg  40 mg Oral Daily    sodium chloride flush 0.9 % injection 5-40 mL  5-40 mL IntraVENous 2 times per day    sodium chloride flush 0.9 % injection 5-40 mL  5-40 mL IntraVENous PRN    0.9 % sodium chloride infusion   IntraVENous PRN    potassium chloride 10 mEq/100 mL IVPB (Peripheral Line)  10 mEq IntraVENous PRN    magnesium sulfate 2000 mg in 50 mL IVPB premix  2,000 mg IntraVENous PRN    ondansetron (ZOFRAN-ODT) disintegrating tablet 4 mg  4 mg Oral Q8H PRN    Or    ondansetron (ZOFRAN) injection 4 mg  4 mg IntraVENous Q6H PRN    polyethylene glycol (GLYCOLAX) packet 17 g  17 g Oral Daily PRN     acetaminophen (TYLENOL) suppository 650 mg  650 mg Rectal Q6H PRN    piperacillin-tazobactam (ZOSYN) 3,375 mg in sodium chloride 0.9 % 50 mL IVPB (mini-bag)  3,375 mg IntraVENous Q8H    glucose chewable tablet 16 g  4 tablet Oral PRN    dextrose bolus 10% 125 mL  125 mL IntraVENous PRN    Or    dextrose bolus 10% 250 mL  250 mL IntraVENous PRN    glucagon (rDNA) injection 1 mg  1 mg SubCUTAneous PRN    dextrose 10 % infusion   IntraVENous Continuous PRN    insulin lispro (HUMALOG) injection vial 0-8 Units  0-8 Units SubCUTAneous TID WC    insulin lispro (HUMALOG) injection vial 0-4 Units  0-4 Units SubCUTAneous Nightly       Allergies: Lactose    Family History   Problem Relation Age of Onset    Diabetes Other 58        parent, NOS    Cancer Father     Hypertension Other 35        parent,NOS    Heart Disease Other 78        parent, NOS    Lung Disease Father      Social History     Socioeconomic History    Marital status: Single     Spouse name: Not on file    Number of children: Not on file    Years of education: Not on file    Highest education level: Not on file   Occupational History    Not on file   Tobacco Use    Smoking status: Every Day     Packs/day: 1.00     Types: Cigarettes, Cigars     Last attempt to quit: 08/2019     Years since quitting: 2.4    Smokeless tobacco: Never   Vaping Use    Vaping Use: Never used   Substance and Sexual Activity    Alcohol use: Yes     Comment: occ    Drug use: Never    Sexual activity: Not on file   Other Topics Concern    Not on file   Social History Narrative    Not on file  Social Determinants of Health     Financial Resource Strain: Not on file   Food Insecurity: Not on file   Transportation Needs: Not on file   Physical Activity: Not on file   Stress: Not on file   Social Connections: Not on file   Intimate Partner Violence: Not on file   Housing Stability: Not on file     Social History     Tobacco Use   Smoking Status Every Day    Packs/day: 1.00    Types:  Cigarettes, Cigars    Last attempt to quit: 08/2019    Years since quitting: 2.4   Smokeless Tobacco Never        Temp (24hrs), Avg:98.4 F (36.9 C), Min:97.9 F (36.6 C), Max:99.1 F (37.3 C)    BP (!) 140/66   Pulse 73   Temp 98.2 F (36.8 C) (Oral)   Resp 20   Ht 4\' 11"  (1.499 m)   Wt 144 lb (65.3 kg)   SpO2 97%   BMI 29.08 kg/m     ROS: 12 point ROS obtained in details. Pertinent positives as mentioned in HPI,   otherwise negative    Physical Exam:    General:  Alert, cooperative, no distress, appears stated age.              Head: Normocephalic, without obvious abnormality, atraumatic.   Eyes:  Conjunctivae/corneas clear. EOMs intact.   Nose: Nares normal. No drainage or sinus tenderness.   Neck: Supple, symmetrical, trachea midline, no adenopathy, thyroid: no enlargement, no carotid bruit and no JVD.   Lungs:   Clear to auscultation bilaterally.   Heart:  Regular rate and rhythm, S1, S2 normal.      Abdomen: Soft, non-tender. Bowel sounds normal.    Extremities: Extremities normal, atraumatic, no cyanosis or edema.   Pulses: 2+ and symmetric all extremities.   Skin:  wound vac sacral decubiti not opened   Neurologic: AAOx2,lethargic. No focal motor or sensory deficit.        Labs: Results:   Chemistry Recent Labs     12/31/21  0029 01/01/22  0505 01/02/22  0209   GLUCOSE 269* 112* 204*   NA 138 141 139   K 3.8 3.8 3.6   CL 105 107 105   CO2 31 30 29    BUN 24* 23* 30*   CREATININE 0.82 0.66 0.76        CBC w/Diff No results for input(s): WBC, RBC, HGB, HCT, PLT in the last 72 hours.    Invalid input(s): GRANS, LYMPH, EOS     Microbiology Results       Procedure Component Value Units Date/Time    Culture, Anaerobic 03/04/22  (Abnormal) Collected: 12/28/21 1030    Order Status: Completed Specimen: Swab from Sacrum Updated: 12/30/21 1137     Special Requests NO SPECIAL REQUESTS        Culture       LIGHT MIXED ANAEROBIC GRAM NEGATIVE RODS          Culture, Wound Aerobic Only 12/30/21   (Abnormal)  (Susceptibility) Collected: 12/28/21 1030    Order Status: Completed Specimen: Swab from Sacrum Updated: 12/31/21 0733     Special Requests NO SPECIAL REQUESTS        Gram stain RARE WBC'S         NO ORGANISMS SEEN        Culture FEW Proteus mirabilis         FEW Enterococcus faecalis  Susceptibility        Proteus mirabilis      BACTERIAL SUSCEPTIBILITY PANEL MIC      amikacin <=2 ug/mL Sensitive      ampicillin <=2 ug/mL Sensitive      ampicillin-sulbactam <=2 ug/mL Sensitive      ceFAZolin <=4 ug/mL Sensitive      cefepime <=1 ug/mL Sensitive      cefOXitin <=4 ug/mL Sensitive      cefTAZidime <=1 ug/mL Sensitive      cefTRIAXone <=1 ug/mL Sensitive      ciprofloxacin <=0.25 ug/mL Sensitive      gentamicin <=1 ug/mL Sensitive      levofloxacin <=0.12 ug/mL Sensitive      meropenem <=0.25 ug/mL Sensitive      piperacillin-tazobactam <=4 ug/mL Sensitive      tobramycin <=1 ug/mL Sensitive      trimethoprim-sulfamethoxazole <=20 ug/mL Sensitive                       Susceptibility        Enterococcus faecalis      BACTERIAL SUSCEPTIBILITY PANEL MIC      ampicillin <=2 ug/mL Resistant      DAPTOmycin 2 ug/mL Sensitive      linezolid 2 ug/mL Sensitive      vancomycin 1 ug/mL Sensitive                           Culture, Blood 2 [[id number]] Collected: 12/28/21 0435    Order Status: Completed Specimen: Blood Updated: 01/02/22 0721     Special Requests --        NO SPECIAL REQUESTS  RIGHT  HAND       Culture NO GROWTH 5 DAYS       Culture, Blood 1 [[id number]] Collected: 12/28/21 0432    Order Status: Completed Specimen: Blood Updated: 01/02/22 0721     Special Requests --        NO SPECIAL REQUESTS  LEFT  HAND       Culture NO GROWTH 5 DAYS       Culture, Wound Aerobic Only [[id number]]  (Abnormal)  (Susceptibility) Collected: 12/28/21 0000    Order Status: Completed Specimen: Sacrum Updated: 01/01/22 0640     Special Requests NO SPECIAL REQUESTS        Gram stain OCCASIONAL WBCS SEEN               2+  APPARENT Gram negative rods                  1+ Gram positive cocci IN PAIRS           Culture       LIGHT PSEUDOMONAS AERUGINOSA                  MODERATE MIXED ENTERIC FLORA          Susceptibility        Pseudomonas aeruginosa      BACTERIAL SUSCEPTIBILITY PANEL MIC      amikacin <=2 ug/mL Sensitive      cefepime <=1 ug/mL Sensitive      cefTAZidime 4 ug/mL Sensitive      ciprofloxacin <=0.25 ug/mL Sensitive      gentamicin <=1 ug/mL Sensitive      levofloxacin 0.5 ug/mL Sensitive      meropenem <=0.25 ug/mL Sensitive      piperacillin-tazobactam <=4 ug/mL Sensitive  tobramycin <=1 ug/mL Sensitive                                       RADIOLOGY:    All available imaging studies/reports in connect care for this admission were reviewed      Disclaimer: Sections of this note are dictated utilizing voice recognition software, which may have resulted in some phonetic based errors in grammar and contents. Even though attempts were made to correct all the mistakes, some may have been missed, and remained in the body of the document. If questions arise, please contact our department.    Dr. Raiford SimmondsManali Laken Lobato, Infectious Disease Specialist  [phone]  January 02, 2022  9:06 AM

## 2022-01-02 NOTE — Progress Notes (Signed)
Comprehensive Nutrition Assessment    Type and Reason for Visit:  Reassess, Positive Nutrition Screen    Nutrition Recommendations/Plan:   Continue current diet as tolerated.  Continue Juven (each provides 95 kcal, 2.5g collagen) BID and order Glucerna (each provides 220 kcal, 10g protein) once daily.  Order MVI/min supplementation.   Continue to monitor tolerance of PO, compliance of oral supplements, weight, labs, and plan of care during admission.         Malnutrition Assessment:  Malnutrition Status:  At risk for malnutrition (Comment) (multiple PI, wound vac pending) (01/02/22 0914)    Context:  Acute Illness       Nutrition Assessment:    Pt admitted for adult failure to thrive, Sacral ulcer, POA, s/p surgical I&D 5/27. Wound VAC to be placed. Positive nutrition screen noted, PUR. Pt asleep at time of visit, observed breakfast untouched. Per flowsheet, intake is fluctuating 25-75% of meals, provides ~65% of estimated kcal, 55% of estimated protein needs. Will continue Juven, Will add oral supplements to increase calorie/protein intake opportunity.    Nutrition Related Findings:    Last BM (including prior to admit): 12/27/21  Pertinent Meds: lasix, lantus, zosyn, vanco, LR @ 75 ml/hr Pertinent Labs: POC Glucose 133-280 mg/dl x 24 hrs Wound Type: Multiple, Pressure Injury       Current Nutrition Intake & Therapies:    Average Meal Intake: 26-50%, 51-75%  Average Supplements Intake: Unable to assess  ADULT DIET; Regular; 4 carb choices (60 gm/meal)  ADULT ORAL NUTRITION SUPPLEMENT; Breakfast, Dinner; Wound Healing Oral Supplement    Anthropometric Measures:  Height: 4' 11" (149.9 cm)  Ideal Body Weight (IBW): 95 lbs (43 kg)    Admission Body Weight: 144 lb (65.3 kg)  Current Body Weight: 144 lb (65.3 kg), 151.6 % IBW. Weight Source: Bed Scale  Current BMI (kg/m2): 29.1  Usual Body Weight: 152 lb (68.9 kg)  % Weight Change (Calculated): -5.3  Weight Adjustment For: No Adjustment  BMI Categories: Overweight (BMI  25.0-29.9)    Estimated Daily Nutrient Needs:  Energy Requirements Based On: Formula  Weight Used for Energy Requirements: Admission  Energy (kcal/day): 4827-0786 (MSJ 1.3-1.5)  Weight Used for Protein Requirements: Admission  Protein (g/day): 78-98 (1.2-1.5 g/day)  Method Used for Fluid Requirements: 1 ml/kcal  Fluid (ml/day): 7544-9201    Nutrition Diagnosis:   Increased nutrient needs related to increase demand for energy/nutrients as evidenced by wounds  Altered nutrition-related lab values related to endocrine dysfuntion as evidenced by lab values    Nutrition Interventions:   Food and/or Nutrient Delivery: Continue Current Diet, Start Oral Nutrition Supplement, Continue Oral Nutrition Supplement  Nutrition Education/Counseling: No recommendation at this time  Coordination of Nutrition Care: Continue to monitor while inpatient  Plan of Care discussed with: .    Goals:  Previous Goal Met: Progressing toward Goal(s)  Goals: Meet at least 75% of estimated needs, by next RD assessment       Nutrition Monitoring and Evaluation:   Behavioral-Environmental Outcomes: None Identified  Food/Nutrient Intake Outcomes: Food and Nutrient Intake, Supplement Intake  Physical Signs/Symptoms Outcomes: Biochemical Data, Chewing or Swallowing, GI Status, Fluid Status or Edema, Hemodynamic Status, Meal Time Behavior, Weight, Skin    Discharge Planning:    Continue current diet     Norton Blizzard, [place], [place], [place]  Contact: [phone]

## 2022-01-03 ENCOUNTER — Telehealth: Payer: Self-pay

## 2022-01-03 LAB — POCT GLUCOSE
POC Glucose: 273 mg/dL — ABNORMAL HIGH (ref 70–110)
POC Glucose: 303 mg/dL — ABNORMAL HIGH (ref 70–110)
POC Glucose: 158 mg/dL — ABNORMAL HIGH (ref 70–110)
POC Glucose: 289 mg/dL — ABNORMAL HIGH (ref 70–110)
POC Glucose: 376 mg/dL — ABNORMAL HIGH (ref 70–110)
POC Glucose: 401 mg/dL (ref 70–110)

## 2022-01-03 LAB — CULTURE, BLOOD 1: Culture: NO GROWTH

## 2022-01-03 LAB — CULTURE, BLOOD 2: Culture: NO GROWTH

## 2022-01-03 MED ORDER — INSULIN GLARGINE 100 UNIT/ML SC SOLN
100 UNIT/ML | Freq: Every day | SUBCUTANEOUS | Status: AC
Start: 2022-01-03 — End: 2022-01-06
  Administered 2022-01-04 – 2022-01-06 (×3): 36 [IU] via SUBCUTANEOUS

## 2022-01-03 MED ORDER — INSULIN LISPRO 100 UNIT/ML IJ SOLN
100 UNIT/ML | Freq: Three times a day (TID) | INTRAMUSCULAR | Status: AC
Start: 2022-01-03 — End: 2022-01-06
  Administered 2022-01-03 – 2022-01-04 (×4): 5 [IU] via SUBCUTANEOUS

## 2022-01-03 MED FILL — PIPERACILLIN SOD-TAZOBACTAM SO 3.375 (3-0.375) G IV SOLR: 3.375 (3-0.375) g | INTRAVENOUS | Qty: 3375

## 2022-01-03 MED FILL — HYDROCODONE-ACETAMINOPHEN 5-325 MG PO TABS: 5-325 MG | ORAL | Qty: 1

## 2022-01-03 MED FILL — VANCOMYCIN HCL 1 G IV SOLR: 1 g | INTRAVENOUS | Qty: 1000

## 2022-01-03 MED FILL — LANTUS 100 UNIT/ML SC SOLN: 100 UNIT/ML | SUBCUTANEOUS | Qty: 1

## 2022-01-03 MED FILL — ASPIRIN LOW DOSE 81 MG PO CHEW: 81 MG | ORAL | Qty: 1

## 2022-01-03 MED FILL — HUMALOG 100 UNIT/ML IJ SOLN: 100 UNIT/ML | INTRAMUSCULAR | Qty: 1

## 2022-01-03 MED FILL — ATORVASTATIN CALCIUM 40 MG PO TABS: 40 MG | ORAL | Qty: 2

## 2022-01-03 MED FILL — THERAPEUTIC-M PO TABS: ORAL | Qty: 1 | Fill #0

## 2022-01-03 MED FILL — FUROSEMIDE 40 MG PO TABS: 40 MG | ORAL | Qty: 1

## 2022-01-03 NOTE — Progress Notes (Signed)
Physician Progress Note      PATIENTANTANASIA, [name] Short  CSN #:                  [id number]  DOB:                       1956-07-05  ADMIT DATE:       12/26/2021 7:58 PM  DISCH DATE:  RESPONDING  PROVIDER #:        Woodward Ku MD          QUERY TEXT:    Patient admitted with failure to thrive. Per 5/26 wound care RN consult, noted   to also have pressure ulcers. If possible, please document in progress notes   and discharge summary the location, present on admission status and stage of   the pressure ulcer:      The medical record reflects the following:  Risk Factors: 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  Clinical Indicators: Per 5/26 Wound care RN: Sacrum, unstageable pressure   injury, 9.3x7.5x0.3cm, foul odor, center black moist necrosis, Rest of wound   bed is yellow & pink, periwound tissue is hyperpigmented proximally &   distally, POA.  Right buttock, stage 2 pressure injury, 4.3x2.7x0.1cm, pink base proximally,   Rest of wound bed is hyperpigmented, dry base, POA.  Right lateral heel, unstageable pressure injury, 3.1x3.7x0cm, dry black   tissue, No drainage, no break in skin, periwound hyperpigmented & tender to   touch, POA.  Left lateral heel, unstageable pressure injury, 4.5x6x0cm, dry black tissue,   No drainage, no break in skin, periwound hyperpigmented & tender to touch,   POA.  Treatment: Wound care following, wound vac    Thank you,  Charlynn Grimes, RN, CDI  [email or web address]  >  Options provided:  -- Unstageable Pressure Ulcer to sacrum, L and R heel; St 2 R buttock, all   present on admission  -- Other - I will add my own diagnosis  -- Disagree - Not applicable / Not valid  -- Disagree - Clinically unable to determine / Unknown  -- Refer to Clinical Documentation Reviewer    PROVIDER RESPONSE TEXT:    This patient has an  unstageable pressure ulcer of the sacrum, L and R heel; St   2 R buttock, all present on admission.    Query created by: Charlynn Grimes on 01/01/2022 9:18 AM      Electronically signed by:  Woodward Ku MD 01/03/2022 8:39 AM

## 2022-01-03 NOTE — Plan of Care (Signed)
Problem: Safety - Adult  Goal: Free from fall injury  Outcome: Progressing     Problem: Skin/Tissue Integrity  Goal: Absence of new skin breakdown  Description: 1.  Monitor for areas of redness and/or skin breakdown  2.  Assess vascular access sites hourly  3.  Every 4-6 hours minimum:  Change oxygen saturation probe site  4.  Every 4-6 hours:  If on nasal continuous positive airway pressure, respiratory therapy assess nares and determine need for appliance change or resting period.  Outcome: Progressing     Problem: ABCDS Injury Assessment  Goal: Absence of physical injury  Outcome: Progressing     Problem: Chronic Conditions and Co-morbidities  Goal: Patient's chronic conditions and co-morbidity symptoms are monitored and maintained or improved  Outcome: Progressing  Flowsheets (Taken 01/02/2022 1919)  Care Plan - Patient's Chronic Conditions and Co-Morbidity Symptoms are Monitored and Maintained or Improved:   Monitor and assess patient's chronic conditions and comorbid symptoms for stability, deterioration, or improvement   Collaborate with multidisciplinary team to address chronic and comorbid conditions and prevent exacerbation or deterioration   Update acute care plan with appropriate goals if chronic or comorbid symptoms are exacerbated and prevent overall improvement and discharge     Problem: Pain  Goal: Verbalizes/displays adequate comfort level or baseline comfort level  Outcome: Progressing     Problem: Nutrition Deficit:  Goal: Optimize nutritional status  Outcome: Progressing     Problem: Neurosensory - Adult  Goal: Absence of seizures  Outcome: Progressing  Flowsheets (Taken 01/02/2022 1919)  Absence of seizures:   Monitor for seizure activity.  If seizure occurs, document type and location of movements and any associated apnea   If seizure occurs, turn head to side and suction secretions as needed     Problem: Respiratory - Adult  Goal: Achieves optimal ventilation and oxygenation  Outcome:  Progressing  Flowsheets (Taken 01/02/2022 1919)  Achieves optimal ventilation and oxygenation:   Assess for changes in respiratory status   Assess for changes in mentation and behavior     Problem: Cardiovascular - Adult  Goal: Maintains optimal cardiac output and hemodynamic stability  Outcome: Progressing  Flowsheets (Taken 01/02/2022 1919)  Maintains optimal cardiac output and hemodynamic stability:   Monitor blood pressure and heart rate   Monitor urine output and notify Licensed Independent Practitioner for values outside of normal range   Assess for signs of decreased cardiac output     Problem: Skin/Tissue Integrity - Adult  Goal: Incisions, wounds, or drain sites healing without S/S of infection  Outcome: Progressing  Flowsheets (Taken 01/02/2022 1919)  Incisions, Wounds, or Drain Sites Healing Without Sign and Symptoms of Infection:   Implement wound care per orders   TWICE DAILY: Assess and document skin integrity  Goal: Oral mucous membranes remain intact  Outcome: Progressing  Flowsheets (Taken 01/02/2022 1919)  Oral Mucous Membranes Remain Intact:   Assess oral mucosa and hygiene practices   Implement preventative oral hygiene regimen     Problem: Musculoskeletal - Adult  Goal: Return mobility to safest level of function  Outcome: Progressing  Flowsheets (Taken 01/02/2022 1919)  Return Mobility to Safest Level of Function: Assess patient stability and activity tolerance for standing, transferring and ambulating with or without assistive devices  Goal: Return ADL status to a safe level of function  Outcome: Progressing  Flowsheets (Taken 01/02/2022 1919)  Return ADL Status to a Safe Level of Function: Assess activities of daily living deficits and provide assistive devices as needed  Problem: Gastrointestinal - Adult  Goal: Minimal or absence of nausea and vomiting  Outcome: Progressing  Flowsheets (Taken 01/02/2022 1919)  Minimal or absence of nausea and vomiting: Administer IV fluids as ordered to ensure adequate  hydration  Goal: Maintains or returns to baseline bowel function  Outcome: Progressing  Flowsheets (Taken 01/02/2022 1919)  Maintains or returns to baseline bowel function: Assess bowel function  Goal: Maintains adequate nutritional intake  Outcome: Progressing  Flowsheets (Taken 01/02/2022 1919)  Maintains adequate nutritional intake:   Monitor percentage of each meal consumed   Identify factors contributing to decreased intake, treat as appropriate   Assist with meals as needed     Problem: Genitourinary - Adult  Goal: Absence of urinary retention  Outcome: Progressing  Flowsheets (Taken 01/02/2022 1919)  Absence of urinary retention:   Assess patient's ability to void and empty bladder   Monitor intake/output and perform bladder scan as needed     Problem: Infection - Adult  Goal: Absence of infection at discharge  Outcome: Progressing  Flowsheets (Taken 01/02/2022 1919)  Absence of infection at discharge:   Assess and monitor for signs and symptoms of infection   Monitor lab/diagnostic results   Monitor all insertion sites i.e., indwelling lines, tubes and drains     Problem: Metabolic/Fluid and Electrolytes - Adult  Goal: Electrolytes maintained within normal limits  Outcome: Progressing  Flowsheets (Taken 01/02/2022 1919)  Electrolytes maintained within normal limits: Monitor labs and assess patient for signs and symptoms of electrolyte imbalances     Problem: Hematologic - Adult  Goal: Maintains hematologic stability  Outcome: Progressing  Flowsheets (Taken 01/02/2022 1919)  Maintains hematologic stability: Assess for signs and symptoms of bleeding or hemorrhage     Problem: Occupational Therapy - Adult  Goal: By Discharge: Performs self-care activities at highest level of function for planned discharge setting.  See evaluation for individualized goals.  Description: Occupational Therapy Goals:  Initiated 12/30/2021 to be met within 7-10 days.    1.  Patient will sit EOB with moderate assistance in order to eat meals  while sitting up.   2.  Patient will complete bed mobility to roll in order to participate in hygiene needs with moderate assistance .  3.  Patient will perform bathing  with moderate assistance .  4.  Patient will perform toilet transfers with moderate assistance .  5.  Patient will perform all aspects of toileting with moderate assistance .  6.  Patient will participate in upper extremity therapeutic exercise/activities with minimal assistance/contact guard assist for 8-10 minutes to increase strength/endurance for ADLs.    7.  Patient will utilize energy conservation techniques during functional activities with verbal, visual, and tactile cues.    PLOF: Pt lives in a single story home with her dtr. Reports aides assist with ADLs a few hours a day, 2-3 days a week.      01/02/2022 1421 by Marylin Crosby Dextradeur, OTA  Outcome: Progressing

## 2022-01-03 NOTE — Progress Notes (Signed)
[place] [facility] Hospitalist Group  Progress Note    Patient: [name] Short Age: 66 y.o. DOB: 03-02-1956 MR#: [id number] SSN: [id number]  Date: 01/03/2022       Subjective/24-hour events:     Patient lying in the bed, feels fine.  No new complaints.    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  Wound VAC to be placed.  Increase Lantus and also add mealtime insulin, continue SSI  Pain control, bowel regimen.  Neuro following.   Continue antibiotics per ID, PICC line in place for outpatient IV antibiotics  PT/OT as tolerated.    Continue supportive care otherwise.      Discussed with case management, awaiting for ARU authorization    Discussed with the patient at bedside and explained about my above plan care.    Case discussed with:  [x] Patient  [x] Family  [x]  Nursing  [] Case Management  DVT Prophylaxis:  [] Lovenox  [] Hep SQ  [x] SCDs  [] Coumadin   [] On Heparin gtt [] PO anticoagulant    Objective:   VS: BP (!) 147/81   Pulse 76   Temp 98.8 F (37.1 C) (Oral)   Resp 18   Ht 4\' 11"  (1.499 m)   Wt 144 lb (65.3 kg)   SpO2 98%   BMI 29.08 kg/m      Tmax/24hrs: Temp (24hrs), Avg:98.5 F (36.9 C), Min:98.1 F (36.7 C), Max:99 F (37.2 C)    Intake/Output Summary (Last 24 hours) at 01/03/2022 1732  Last data filed at 01/03/2022 1321  Gross per 24 hour   Intake 2960 ml   Output 2075 ml   Net 885 ml       Gen:  Appears, NAD.  Lungs: Clear, no wheezes   CV: RRR.  Abdomen: Soft, NTTP.  Extremities: Warm, no pitting edema or ischemia.  Neuro:  Awake and alert.  Oriented x3  Back dressing with wound VAC in place    Current Facility-Administered Medications   Medication Dose  Route Frequency    therapeutic multivitamin-minerals 1 tablet  1 tablet Oral Daily    insulin glargine (LANTUS) injection vial 33 Units  33 Units SubCUTAneous Daily    vancomycin (VANCOCIN) 1,000 mg in sodium chloride 0.9 % 250 mL (vial-mate) IVPB  1,000 mg IntraVENous Q12H    aspirin chewable tablet 81 mg  81 mg Oral Daily    acetaminophen (TYLENOL) tablet 500 mg  500 mg Oral Q4H PRN    atorvastatin (LIPITOR) tablet 80 mg  80 mg Oral Nightly    lactated ringers IV soln infusion   IntraVENous Continuous    HYDROcodone-acetaminophen (NORCO) 5-325 MG per tablet 1 tablet  1 tablet Oral Q4H PRN    furosemide (LASIX) tablet 40 mg  40 mg Oral Daily    sodium chloride flush 0.9 % injection 5-40 mL  5-40 mL IntraVENous 2 times per day    sodium chloride flush 0.9 % injection 5-40 mL  5-40 mL IntraVENous PRN    0.9 % sodium chloride infusion   IntraVENous PRN    potassium chloride 10 mEq/100 mL IVPB (Peripheral Line)  10 mEq IntraVENous PRN  magnesium sulfate 2000 mg in 50 mL IVPB premix  2,000 mg IntraVENous PRN    ondansetron (ZOFRAN-ODT) disintegrating tablet 4 mg  4 mg Oral Q8H PRN    Or    ondansetron (ZOFRAN) injection 4 mg  4 mg IntraVENous Q6H PRN    polyethylene glycol (GLYCOLAX) packet 17 g  17 g Oral Daily PRN    acetaminophen (TYLENOL) suppository 650 mg  650 mg Rectal Q6H PRN    piperacillin-tazobactam (ZOSYN) 3,375 mg in sodium chloride 0.9 % 50 mL IVPB (mini-bag)  3,375 mg IntraVENous Q8H    glucose chewable tablet 16 g  4 tablet Oral PRN    dextrose bolus 10% 125 mL  125 mL IntraVENous PRN    Or    dextrose bolus 10% 250 mL  250 mL IntraVENous PRN    glucagon (rDNA) injection 1 mg  1 mg SubCUTAneous PRN    dextrose 10 % infusion   IntraVENous Continuous PRN    insulin lispro (HUMALOG) injection vial 0-8 Units  0-8 Units SubCUTAneous TID WC    insulin lispro (HUMALOG) injection vial 0-4 Units  0-4 Units SubCUTAneous Nightly        Labs:    Recent Results (from the past 24 hour(s))   POCT Glucose     Collection Time: 01/02/22  9:33 PM   Result Value Ref Range    POC Glucose 303 (H) 70 - 110 mg/dL   POCT Glucose    Collection Time: 01/02/22  9:35 PM   Result Value Ref Range    POC Glucose 273 (H) 70 - 110 mg/dL   POCT Glucose    Collection Time: 01/03/22  8:27 AM   Result Value Ref Range    POC Glucose 158 (H) 70 - 110 mg/dL   POCT Glucose    Collection Time: 01/03/22 12:08 PM   Result Value Ref Range    POC Glucose 289 (H) 70 - 110 mg/dL   POCT Glucose    Collection Time: 01/03/22  4:10 PM   Result Value Ref Range    POC Glucose 401 (HH) 70 - 110 mg/dL   POCT Glucose    Collection Time: 01/03/22  4:13 PM   Result Value Ref Range    POC Glucose 376 (H) 70 - 110 mg/dL         Signed By: Woodward Ku, MD     January 03, 2022

## 2022-01-03 NOTE — Plan of Care (Addendum)
Problem: Physical Therapy - Adult  Goal: By Discharge: Performs mobility at highest level of function for planned discharge setting.  See evaluation for individualized goals.  Description: Physical Therapy Goals:  Initiated 12/30/2021 to be met within 7-10 days.    1.  Patient will move from supine to sit and sit to supine  and scoot up and down in bed with minimal assistance/contact guard assist.    2.  Patient will transfer from bed to chair and chair to bed with moderate assistance  using the least restrictive device.  3.  Patient will perform sit to stand with moderate assistance .  4.  Patient will ambulate with moderate assistance  for 5 feet with the least restrictive device.     PLOF: Per daughter, pt was ambulating with RW; however, questionable at this time when this was given pt's current sacral wound.      Outcome: Progressing     PHYSICAL THERAPY TREATMENT    Patient: [name] Short (66 y.o. female)  Date: 01/03/2022  Diagnosis: Failure to thrive in adult [R62.7]  Fall, initial encounter [W19.XXXA]  Fall at home, initial encounter [phone], H37.169]  Cerebrovascular accident (CVA), unspecified mechanism ([place]) [I63.9] Fall  Procedure(s) (LRB):  SACRAL WOUND DEBRIDEMENT INCISION AND DRAINAGE (N/A) 6 Days Post-Op  Precautions: Fall Risk  ASSESSMENT:  Patient motivated to participate in OOB mobility. Seen with OT to maximize safety. Patient reports pain in BLE's and sacral wound with wound vac intact. She has difficulty with supine to sit transfer limited by pain and weakness needing 2 person assistance. Verbal cues for cervical extension and scapular retraction to improve posture. She completes exercises per flow sheet with increased weakness of right side 2/2 CVA. Patient transfers to standing at [facility] with min A. She ambulates 3 ft to the chair with slow, shuffled gait. Seated rest break 2/2 fatigue and pain. She completes one more standing trial at RW to increase standing tolerance for pressure relief. C/o  dizziness and returns to sitting. Educated on breathing technique to avoid holding breath during activity. Patient left resting in recliner with legs slightly elevated and all needs in reach.     Progression toward goals:   []       Improving appropriately and progressing toward goals  [x]       Improving slowly and progressing toward goals  []       Not making progress toward goals and plan of care will be adjusted     PLAN:  Patient continues to benefit from skilled intervention to address the above impairments.  Continue treatment per established plan of care.    Further Equipment Recommendations for Discharge: RW, WC    AMPAC: AM-PAC Inpatient Mobility Raw Score : 16      Current research shows that an AM-PAC score of 17 (13 without stairs) or less is not associated with a discharge to the patient's home setting. Based on an AM-PAC score and their current functional mobility deficits, it is recommended that the patient have 5-7 sessions per week of Physical Therapy at d/c to increase the patient's independence.  Currently, this patient demonstrates the potential endurance, and/or tolerance for 3 hours of therapy each day at d/c.      This AMPAC score should be considered in conjunction with interdisciplinary team recommendations to determine the most appropriate discharge setting. Patient's social support, diagnosis, medical stability, and prior level of function should also be taken into consideration.     SUBJECTIVE:   Patient stated, "I didn't  have the right equipment at home."    OBJECTIVE DATA SUMMARY:       Functional Mobility Training:  Bed Mobility:  Bed Mobility Training  Supine to Sit: Maximum assistance;Additional time  Scooting: Maximum assistance    Transfers:  Transfer Training  Sit to Stand: Minimum assistance  Stand to Sit: Minimum assistance    Balance:  Balance  Sitting: Impaired  Sitting - Static: Good (unsupported)  Sitting - Dynamic: Fair (occasional)  Standing: With support  Standing - Static:  Fair  Standing - Dynamic: Fair         Ambulation/Gait Training:     Gait  Overall Level of Assistance: Minimum assistance  Speed/Cadence: Slow;Shuffled  Step Length: Left shortened;Right shortened  Gait Abnormalities: Decreased step clearance  Distance (ft): 3 Feet  Assistive Device: Walker, rolling       Therapeutic Exercises:     EXERCISE   Sets   Reps   Active Active Assist   Passive Self ROM   Comments   Ankle Pumps 1 10  [x]  []  []  []     Quad Sets/Glut Sets    []  []  []  []  Hold for 5 secs   Hamstring Sets   []  []  []  []     Short Arc Quads   []  []  []  []     Heel Slides   []  []  []  []     Straight Leg Raises   []  []  []  []     Hip Add   []  []  []  []  Hold for 5 secs, w/ pillow squeeze   Long Arc Quads 2 10 [x]  []  []  []     Seated Marching   []  []  []  []     Standing Marching   []  []  []  []     Scapular retractions   [x]  []  []  []         Pain:  Pain level pre-treatment: -/10  Pain level post-treatment: -/10   Pain Intervention(s): Rest, Ice, Repositioning   Response to intervention: Nurse notified    Activity Tolerance:    Fair  Please refer to the flowsheet for vital signs taken during this treatment.  After treatment:   [x]  Patient left in no apparent distress sitting up in chair  []  Patient left in no apparent distress in bed  [x]  Call bell left within reach  [x]  Nursing notified  []  Caregiver present  []  Bed alarm activated  []  SCDs applied      COMMUNICATION/EDUCATION:   Patient Education  Education Given To: Patient  Education Provided: Role of Therapy;Plan of Care;Transfer Training;Equipment;Fall Prevention Strategies;Energy Conservation;Home Exercise Program  Education Method: Demonstration;Verbal  Barriers to Learning: None  Education Outcome: Verbalized understanding;Continued education needed      [employer], PT  Minutes: 89

## 2022-01-03 NOTE — Progress Notes (Signed)
ARU/IPR REFERRAL CONTACT NOTE  [facility]      Thank you for the opportunity to review this patient's case for admission to [facility].    Based on our pre-admission screening:     [x ] Our Team/Medical Director is following this case.   Comments: Followed up with care manager with insurance via email x2 requesting update on authorization for IPR.   CM with insurance reports that case remains pending review by MD.     Again, Thank you for this referral. Should you have any questions please do not hesitate to call.     Sincerely,  Kenna Gilbert    Clinical Liaison  [facility]  [phone]

## 2022-01-03 NOTE — Plan of Care (Signed)
Problem: Occupational Therapy - Adult  Goal: By Discharge: Performs self-care activities at highest level of function for planned discharge setting.  See evaluation for individualized goals.  Description: Occupational Therapy Goals:  Initiated 12/30/2021 to be met within 7-10 days.    1.  Patient will sit EOB with moderate assistance in order to eat meals while sitting up.   2.  Patient will complete bed mobility to roll in order to participate in hygiene needs with moderate assistance .  3.  Patient will perform bathing  with moderate assistance .  4.  Patient will perform toilet transfers with moderate assistance .  5.  Patient will perform all aspects of toileting with moderate assistance .  6.  Patient will participate in upper extremity therapeutic exercise/activities with minimal assistance/contact guard assist for 8-10 minutes to increase strength/endurance for ADLs.    7.  Patient will utilize energy conservation techniques during functional activities with verbal, visual, and tactile cues.    PLOF: Pt lives in a single story home with her dtr. Reports aides assist with ADLs a few hours a day, 2-3 days a week.      Outcome: Progressing   OCCUPATIONAL THERAPY TREATMENT    Patient: [name] Short (66 y.o. female)  Date: 01/03/2022  Diagnosis: Failure to thrive in adult [R62.7]  Fall, initial encounter [W19.XXXA]  Fall at home, initial encounter [phone], T24.580]  Cerebrovascular accident (CVA), unspecified mechanism ([place]) [I63.9] Fall  Procedure(s) (LRB):  SACRAL WOUND DEBRIDEMENT INCISION AND DRAINAGE (N/A) 6 Days Post-Op  Precautions: Fall Risk    Chart, occupational therapy assessment, plan of care, and goals were reviewed.  ASSESSMENT:  Co-treated w/PT to maximize safety w/functional transfer training. Pt appears brighter today. Anxious for OOB activity. Reviewed cervical precautions 2/2 fusion/laminectomy 12/09/21. Pt demonstrates improved bed mobility and sitting balance today. Pt performs UE Therex, NMR  activities and simple ADL grooming tasks seated EOB . Pt tolerates standing for 2 trials performing NMR activity in prep for functional transfer training to BSC/toilet. Pt left in chair and reinforced importance of calling for assistance.       Progression toward goals:  _0           Improving appropriately and progressing toward goals  _1           Improving slowly and progressing toward goals  _2           Not making progress toward goals and plan of care will be adjusted     PLAN:  Patient continues to benefit from skilled intervention to address the above impairments.  Continue treatment per established plan of care.    Further Equipment Recommendations for Discharge: shower chair and rolling walker    AMPAC: AM-PAC Inpatient Daily Activity Raw Score: 15    Current research shows that an AM-PAC score of 17 or less is not associated with a discharge to the patient's home setting. Based on an AM-PAC score and their current ADL deficits; it is recommended that the patient have 5-7 sessions per week of Occupational Therapy at d/c to increase the patient's independence.  Currently, this patient demonstrates the potential endurance, and/or tolerance for 3 hours of therapy each day at d/c.      This AMPAC score should be considered in conjunction with interdisciplinary team recommendations to determine the most appropriate discharge setting. Patient's social support, diagnosis, medical stability, and prior level of function should also be taken into consideration.     SUBJECTIVE:   Patient stated, "I didn't have  the equipment that I needed when I went home."    OBJECTIVE DATA SUMMARY:   Cognitive/Behavioral Status:  Orientation  Orientation Level: Oriented X4    Functional Mobility and Transfers for ADLs:   Bed Mobility:  Bed Mobility Training  Supine to Sit: Maximum assistance;Additional time  Scooting: Maximum assistance     Transfers:  Transfer Training  Sit to Stand: Minimum assistance  Stand to Sit: Minimum  assistance    Balance:  Balance  Sitting: Impaired  Sitting - Static: Good (unsupported)  Sitting - Dynamic: Fair (occasional)  Standing: With support  Standing - Static: Fair  Standing - Dynamic: Fair    ADL Intervention:  Grooming: Setup  Face/hand hygiene, seated EOB    Neuro Re-Education:  Weight shifting L/R, anterior, posterior, seated EOB  Weight shifting L/R, standing w/RW for support    UE Therapeutic Exercises:   AROM BUE shoulder depression/elevation, horizontal ab/adduction  AROM BUE elbow flexion/extension    Pain:  Pain level pre-treatment: 0/10   Pain level post-treatment: 0/10  Pt c/o sacral pain, however, pain not rated    Activity Tolerance:    Good    After treatment:   _0   Patient left in no apparent distress sitting up in chair  _1   Patient left in no apparent distress in bed  _2   Call bell left within reach  _3   Nursing notified  _4   Caregiver present  _5   Bed alarm activated    COMMUNICATION/EDUCATION:   Patient Education  Education Given To: Patient  Education Provided: Role of Therapy;Plan of Catering manager;ADL Adaptive Strategies;Equipment;Transfer Training  Education Method: Verbal;Teach Back  Barriers to Learning: None  Education Outcome: Continued education needed    Thank you for this referral.  Jazilyn Siegenthaler, OTA  Minutes: 38

## 2022-01-03 NOTE — Plan of Care (Signed)
Problem: Safety - Adult  Goal: Free from fall injury  Outcome: Progressing     Problem: Skin/Tissue Integrity  Goal: Absence of new skin breakdown  Description: 1.  Monitor for areas of redness and/or skin breakdown  2.  Assess vascular access sites hourly  3.  Every 4-6 hours minimum:  Change oxygen saturation probe site  4.  Every 4-6 hours:  If on nasal continuous positive airway pressure, respiratory therapy assess nares and determine need for appliance change or resting period.  Outcome: Progressing     Problem: ABCDS Injury Assessment  Goal: Absence of physical injury  Outcome: Progressing  Flowsheets (Taken 01/03/2022 1625)  Absence of Physical Injury: Implement safety measures based on patient assessment     Problem: Chronic Conditions and Co-morbidities  Goal: Patient's chronic conditions and co-morbidity symptoms are monitored and maintained or improved  Outcome: Progressing  Flowsheets (Taken 01/03/2022 1614)  Care Plan - Patient's Chronic Conditions and Co-Morbidity Symptoms are Monitored and Maintained or Improved:   Monitor and assess patient's chronic conditions and comorbid symptoms for stability, deterioration, or improvement   Collaborate with multidisciplinary team to address chronic and comorbid conditions and prevent exacerbation or deterioration     Problem: Pain  Goal: Verbalizes/displays adequate comfort level or baseline comfort level  Outcome: Progressing     Problem: Nutrition Deficit:  Goal: Optimize nutritional status  Outcome: Progressing     Problem: Neurosensory - Adult  Goal: Absence of seizures  Outcome: Progressing  Flowsheets (Taken 01/03/2022 0929)  Absence of seizures:   Monitor for seizure activity.  If seizure occurs, document type and location of movements and any associated apnea   If seizure occurs, turn head to side and suction secretions as needed     Problem: Respiratory - Adult  Goal: Achieves optimal ventilation and oxygenation  Outcome: Progressing     Problem:  Cardiovascular - Adult  Goal: Maintains optimal cardiac output and hemodynamic stability  Outcome: Progressing  Flowsheets (Taken 01/03/2022 0929)  Maintains optimal cardiac output and hemodynamic stability:   Monitor blood pressure and heart rate   Assess for signs of decreased cardiac output     Problem: Skin/Tissue Integrity - Adult  Goal: Incisions, wounds, or drain sites healing without S/S of infection  Outcome: Progressing  Flowsheets  Taken 01/03/2022 1626  Incisions, Wounds, or Drain Sites Healing Without Sign and Symptoms of Infection:   TWICE DAILY: Assess and document skin integrity   TWICE DAILY: Assess and document dressing/incision, wound bed, drain sites and surrounding tissue  Taken 01/03/2022 0929  Incisions, Wounds, or Drain Sites Healing Without Sign and Symptoms of Infection:   TWICE DAILY: Assess and document skin integrity   TWICE DAILY: Assess and document dressing/incision, wound bed, drain sites and surrounding tissue  Goal: Oral mucous membranes remain intact  Outcome: Progressing  Flowsheets  Taken 01/03/2022 1626  Oral Mucous Membranes Remain Intact:   Assess oral mucosa and hygiene practices   Implement oral medicated treatments as ordered   Implement preventative oral hygiene regimen  Taken 01/03/2022 0929  Oral Mucous Membranes Remain Intact:   Assess oral mucosa and hygiene practices   Implement preventative oral hygiene regimen     Problem: Musculoskeletal - Adult  Goal: Return mobility to safest level of function  Outcome: Progressing  Flowsheets (Taken 01/03/2022 0929)  Return Mobility to Safest Level of Function:   Assess patient stability and activity tolerance for standing, transferring and ambulating with or without assistive devices   Assist with transfers  and ambulation using safe patient handling equipment as needed  Goal: Return ADL status to a safe level of function  Outcome: Progressing  Flowsheets (Taken 01/03/2022 0929)  Return ADL Status to a Safe Level of Function:   Administer  medication as ordered   Assess activities of daily living deficits and provide assistive devices as needed     Problem: Gastrointestinal - Adult  Goal: Minimal or absence of nausea and vomiting  Outcome: Progressing  Flowsheets (Taken 01/03/2022 0929)  Minimal or absence of nausea and vomiting: Administer IV fluids as ordered to ensure adequate hydration  Goal: Maintains or returns to baseline bowel function  Outcome: Progressing  Flowsheets (Taken 01/03/2022 0929)  Maintains or returns to baseline bowel function:   Assess bowel function   Encourage oral fluids to ensure adequate hydration   Administer IV fluids as ordered to ensure adequate hydration  Goal: Maintains adequate nutritional intake  Outcome: Progressing  Flowsheets (Taken 01/03/2022 0929)  Maintains adequate nutritional intake:   Monitor percentage of each meal consumed   Identify factors contributing to decreased intake, treat as appropriate   Assist with meals as needed     Problem: Genitourinary - Adult  Goal: Absence of urinary retention  Outcome: Progressing  Flowsheets (Taken 01/03/2022 0929)  Absence of urinary retention:   Assess patient's ability to void and empty bladder   Monitor intake/output and perform bladder scan as needed     Problem: Infection - Adult  Goal: Absence of infection at discharge  Outcome: Progressing  Flowsheets (Taken 01/03/2022 1614)  Absence of infection at discharge:   Assess and monitor for signs and symptoms of infection   Monitor lab/diagnostic results   Monitor all insertion sites i.e., indwelling lines, tubes and drains     Problem: Metabolic/Fluid and Electrolytes - Adult  Goal: Electrolytes maintained within normal limits  Outcome: Progressing  Flowsheets (Taken 01/03/2022 1614)  Electrolytes maintained within normal limits:   Monitor labs and assess patient for signs and symptoms of electrolyte imbalances   Administer electrolyte replacement as ordered   Monitor response to electrolyte replacements, including repeat lab  results as appropriate     Problem: Hematologic - Adult  Goal: Maintains hematologic stability  Outcome: Progressing  Flowsheets (Taken 01/03/2022 1614)  Maintains hematologic stability:   Assess for signs and symptoms of bleeding or hemorrhage   Monitor labs for bleeding or clotting disorders     Problem: Occupational Therapy - Adult  Goal: By Discharge: Performs self-care activities at highest level of function for planned discharge setting.  See evaluation for individualized goals.  Description: Occupational Therapy Goals:  Initiated 12/30/2021 to be met within 7-10 days.    1.  Patient will sit EOB with moderate assistance in order to eat meals while sitting up.   2.  Patient will complete bed mobility to roll in order to participate in hygiene needs with moderate assistance .  3.  Patient will perform bathing  with moderate assistance .  4.  Patient will perform toilet transfers with moderate assistance .  5.  Patient will perform all aspects of toileting with moderate assistance .  6.  Patient will participate in upper extremity therapeutic exercise/activities with minimal assistance/contact guard assist for 8-10 minutes to increase strength/endurance for ADLs.    7.  Patient will utilize energy conservation techniques during functional activities with verbal, visual, and tactile cues.    PLOF: Pt lives in a single story home with her dtr. Reports aides assist with ADLs a  few hours a day, 2-3 days a week.      01/03/2022 1402 by Marylin Crosby Dextradeur, OTA  Outcome: Progressing     Problem: Physical Therapy - Adult  Goal: By Discharge: Performs mobility at highest level of function for planned discharge setting.  See evaluation for individualized goals.  Description: Physical Therapy Goals:  Initiated 12/30/2021 to be met within 7-10 days.    1.  Patient will move from supine to sit and sit to supine  and scoot up and down in bed with minimal assistance/contact guard assist.    2.  Patient will transfer from bed to chair  and chair to bed with moderate assistance  using the least restrictive device.  3.  Patient will perform sit to stand with moderate assistance .  4.  Patient will ambulate with moderate assistance  for 5 feet with the least restrictive device.     PLOF: Per daughter, pt was ambulating with RW; however, questionable at this time when this was given pt's current sacral wound.      01/03/2022 1236 by Ruta Hinds, PT  Outcome: Progressing

## 2022-01-03 NOTE — Telephone Encounter (Signed)
  Chronic Care Management   Note  01/03/2022 Name: Julie Elliott MRN: [id number] DOB: [birth year]  Successful outreach to patient for transition of care call.  Verified name and date of birth with patient.  Transition of care interview is not appropriate as patient denies being in the hospital.  Johnney Killian, RN, BSN, [facility] Phone: [phone]: [phone]

## 2022-01-04 LAB — POCT GLUCOSE
POC Glucose: 277 mg/dL — ABNORMAL HIGH (ref 70–110)
POC Glucose: 132 mg/dL — ABNORMAL HIGH (ref 70–110)
POC Glucose: 185 mg/dL — ABNORMAL HIGH (ref 70–110)
POC Glucose: 150 mg/dL — ABNORMAL HIGH (ref 70–110)

## 2022-01-04 MED FILL — HYDROCODONE-ACETAMINOPHEN 5-325 MG PO TABS: 5-325 MG | ORAL | Qty: 1

## 2022-01-04 MED FILL — HUMALOG 100 UNIT/ML IJ SOLN: 100 UNIT/ML | INTRAMUSCULAR | Qty: 1

## 2022-01-04 MED FILL — VANCOMYCIN HCL 1 G IV SOLR: 1 g | INTRAVENOUS | Qty: 1000

## 2022-01-04 MED FILL — PIPERACILLIN SOD-TAZOBACTAM SO 3.375 (3-0.375) G IV SOLR: 3.375 (3-0.375) g | INTRAVENOUS | Qty: 3375

## 2022-01-04 MED FILL — THERAPEUTIC-M PO TABS: ORAL | Qty: 1 | Fill #0

## 2022-01-04 MED FILL — LANTUS 100 UNIT/ML SC SOLN: 100 UNIT/ML | SUBCUTANEOUS | Qty: 1

## 2022-01-04 MED FILL — ASPIRIN LOW DOSE 81 MG PO CHEW: 81 MG | ORAL | Qty: 1

## 2022-01-04 MED FILL — ATORVASTATIN CALCIUM 40 MG PO TABS: 40 MG | ORAL | Qty: 2

## 2022-01-04 MED FILL — FUROSEMIDE 40 MG PO TABS: 40 MG | ORAL | Qty: 1

## 2022-01-04 NOTE — Plan of Care (Signed)
Problem: Safety - Adult  Goal: Free from fall injury  01/04/2022 0035 by Corliss Parish, RN  Outcome: Progressing  01/03/2022 1627 by Marolyn Haller, RN  Outcome: Progressing     Problem: Skin/Tissue Integrity  Goal: Absence of new skin breakdown  Description: 1.  Monitor for areas of redness and/or skin breakdown  2.  Assess vascular access sites hourly  3.  Every 4-6 hours minimum:  Change oxygen saturation probe site  4.  Every 4-6 hours:  If on nasal continuous positive airway pressure, respiratory therapy assess nares and determine need for appliance change or resting period.  01/04/2022 0035 by Corliss Parish, RN  Outcome: Progressing  01/03/2022 1627 by Marolyn Haller, RN  Outcome: Progressing     Problem: ABCDS Injury Assessment  Goal: Absence of physical injury  01/04/2022 0035 by Corliss Parish, RN  Outcome: Progressing  01/03/2022 1627 by Marolyn Haller, RN  Outcome: Progressing  Flowsheets (Taken 01/03/2022 1625)  Absence of Physical Injury: Implement safety measures based on patient assessment     Problem: Chronic Conditions and Co-morbidities  Goal: Patient's chronic conditions and co-morbidity symptoms are monitored and maintained or improved  01/04/2022 0035 by Corliss Parish, RN  Outcome: Progressing  Flowsheets (Taken 01/03/2022 1910)  Care Plan - Patient's Chronic Conditions and Co-Morbidity Symptoms are Monitored and Maintained or Improved:   Monitor and assess patient's chronic conditions and comorbid symptoms for stability, deterioration, or improvement   Collaborate with multidisciplinary team to address chronic and comorbid conditions and prevent exacerbation or deterioration   Update acute care plan with appropriate goals if chronic or comorbid symptoms are exacerbated and prevent overall improvement and discharge  01/03/2022 1627 by Marolyn Haller, RN  Outcome: Progressing  Flowsheets (Taken 01/03/2022 1614)  Care Plan - Patient's Chronic Conditions and Co-Morbidity Symptoms are Monitored and Maintained or  Improved:   Monitor and assess patient's chronic conditions and comorbid symptoms for stability, deterioration, or improvement   Collaborate with multidisciplinary team to address chronic and comorbid conditions and prevent exacerbation or deterioration     Problem: Pain  Goal: Verbalizes/displays adequate comfort level or baseline comfort level  01/04/2022 0035 by Corliss Parish, RN  Outcome: Progressing  01/03/2022 1627 by Marolyn Haller, RN  Outcome: Progressing     Problem: Nutrition Deficit:  Goal: Optimize nutritional status  01/04/2022 0035 by Corliss Parish, RN  Outcome: Progressing  01/03/2022 1627 by Marolyn Haller, RN  Outcome: Progressing     Problem: Neurosensory - Adult  Goal: Absence of seizures  01/04/2022 0035 by Corliss Parish, RN  Outcome: Progressing  Flowsheets (Taken 01/03/2022 1910)  Absence of seizures:   Monitor for seizure activity.  If seizure occurs, document type and location of movements and any associated apnea   If seizure occurs, turn head to side and suction secretions as needed  01/03/2022 1627 by Marolyn Haller, RN  Outcome: Progressing  Flowsheets (Taken 01/03/2022 0929)  Absence of seizures:   Monitor for seizure activity.  If seizure occurs, document type and location of movements and any associated apnea   If seizure occurs, turn head to side and suction secretions as needed     Problem: Respiratory - Adult  Goal: Achieves optimal ventilation and oxygenation  01/04/2022 0035 by Corliss Parish, RN  Outcome: Progressing  Flowsheets (Taken 01/03/2022 1910)  Achieves optimal ventilation and oxygenation:   Assess for changes in respiratory status   Assess for changes in mentation and behavior  01/03/2022 1627 by Marolyn Haller, RN  Outcome: Progressing  Problem: Cardiovascular - Adult  Goal: Maintains optimal cardiac output and hemodynamic stability  01/04/2022 0035 by Corliss Parish, RN  Outcome: Progressing  Flowsheets (Taken 01/03/2022 1910)  Maintains optimal cardiac output and hemodynamic stability:    Monitor blood pressure and heart rate   Monitor urine output and notify Licensed Independent Practitioner for values outside of normal range   Assess for signs of decreased cardiac output  01/03/2022 1627 by Marolyn Haller, RN  Outcome: Progressing  Flowsheets (Taken 01/03/2022 0929)  Maintains optimal cardiac output and hemodynamic stability:   Monitor blood pressure and heart rate   Assess for signs of decreased cardiac output     Problem: Skin/Tissue Integrity - Adult  Goal: Incisions, wounds, or drain sites healing without S/S of infection  01/04/2022 0035 by Corliss Parish, RN  Outcome: Progressing  Flowsheets (Taken 01/03/2022 1910)  Incisions, Wounds, or Drain Sites Healing Without Sign and Symptoms of Infection:   TWICE DAILY: Assess and document skin integrity   Implement wound care per orders  01/03/2022 1627 by Marolyn Haller, RN  Outcome: Progressing  Flowsheets  Taken 01/03/2022 1626  Incisions, Wounds, or Drain Sites Healing Without Sign and Symptoms of Infection:   TWICE DAILY: Assess and document skin integrity   TWICE DAILY: Assess and document dressing/incision, wound bed, drain sites and surrounding tissue  Taken 01/03/2022 0929  Incisions, Wounds, or Drain Sites Healing Without Sign and Symptoms of Infection:   TWICE DAILY: Assess and document skin integrity   TWICE DAILY: Assess and document dressing/incision, wound bed, drain sites and surrounding tissue  Goal: Oral mucous membranes remain intact  01/04/2022 0035 by Corliss Parish, RN  Outcome: Progressing  Flowsheets (Taken 01/03/2022 1910)  Oral Mucous Membranes Remain Intact: Assess oral mucosa and hygiene practices  01/03/2022 1627 by Marolyn Haller, RN  Outcome: Progressing  Flowsheets  Taken 01/03/2022 1626  Oral Mucous Membranes Remain Intact:   Assess oral mucosa and hygiene practices   Implement oral medicated treatments as ordered   Implement preventative oral hygiene regimen  Taken 01/03/2022 0929  Oral Mucous Membranes Remain Intact:   Assess oral mucosa and  hygiene practices   Implement preventative oral hygiene regimen     Problem: Musculoskeletal - Adult  Goal: Return mobility to safest level of function  01/04/2022 0035 by Corliss Parish, RN  Outcome: Progressing  Flowsheets (Taken 01/03/2022 1910)  Return Mobility to Safest Level of Function: Assess patient stability and activity tolerance for standing, transferring and ambulating with or without assistive devices  01/03/2022 1627 by Marolyn Haller, RN  Outcome: Progressing  Flowsheets (Taken 01/03/2022 0929)  Return Mobility to Safest Level of Function:   Assess patient stability and activity tolerance for standing, transferring and ambulating with or without assistive devices   Assist with transfers and ambulation using safe patient handling equipment as needed  Goal: Return ADL status to a safe level of function  01/04/2022 0035 by Corliss Parish, RN  Outcome: Progressing  01/03/2022 1627 by Marolyn Haller, RN  Outcome: Progressing  Flowsheets (Taken 01/03/2022 [phone])  Return ADL Status to a Safe Level of Function:   Administer medication as ordered   Assess activities of daily living deficits and provide assistive devices as needed     Problem: Gastrointestinal - Adult  Goal: Minimal or absence of nausea and vomiting  01/04/2022 0035 by Corliss Parish, RN  Outcome: Progressing  Flowsheets (Taken 01/03/2022 1910)  Minimal or absence of nausea and vomiting: Administer IV fluids as ordered to ensure adequate hydration  01/03/2022 1627 by Marolyn Haller, RN  Outcome: Progressing  Flowsheets (Taken 01/03/2022 [phone])  Minimal or absence of nausea and vomiting: Administer IV fluids as ordered to ensure adequate hydration  Goal: Maintains or returns to baseline bowel function  01/04/2022 0035 by Corliss Parish, RN  Outcome: Progressing  Flowsheets (Taken 01/03/2022 1910)  Maintains or returns to baseline bowel function:   Assess bowel function   Encourage oral fluids to ensure adequate hydration   Administer IV fluids as ordered to ensure adequate  hydration  01/03/2022 1627 by Marolyn Haller, RN  Outcome: Progressing  Flowsheets (Taken 01/03/2022 0929)  Maintains or returns to baseline bowel function:   Assess bowel function   Encourage oral fluids to ensure adequate hydration   Administer IV fluids as ordered to ensure adequate hydration  Goal: Maintains adequate nutritional intake  01/04/2022 0035 by Corliss Parish, RN  Outcome: Progressing  Flowsheets (Taken 01/03/2022 1910)  Maintains adequate nutritional intake: Monitor percentage of each meal consumed  01/03/2022 1627 by Marolyn Haller, RN  Outcome: Progressing  Flowsheets (Taken 01/03/2022 0929)  Maintains adequate nutritional intake:   Monitor percentage of each meal consumed   Identify factors contributing to decreased intake, treat as appropriate   Assist with meals as needed     Problem: Genitourinary - Adult  Goal: Absence of urinary retention  01/04/2022 0035 by Corliss Parish, RN  Outcome: Progressing  Flowsheets (Taken 01/03/2022 1910)  Absence of urinary retention:   Assess patient's ability to void and empty bladder   Monitor intake/output and perform bladder scan as needed  01/03/2022 1627 by Marolyn Haller, RN  Outcome: Progressing  Flowsheets (Taken 01/03/2022 0929)  Absence of urinary retention:   Assess patient's ability to void and empty bladder   Monitor intake/output and perform bladder scan as needed     Problem: Infection - Adult  Goal: Absence of infection at discharge  01/04/2022 0035 by Corliss Parish, RN  Outcome: Progressing  Flowsheets (Taken 01/03/2022 1910)  Absence of infection at discharge:   Assess and monitor for signs and symptoms of infection   Monitor lab/diagnostic results   Monitor all insertion sites i.e., indwelling lines, tubes and drains  01/03/2022 1627 by Marolyn Haller, RN  Outcome: Progressing  Flowsheets (Taken 01/03/2022 1614)  Absence of infection at discharge:   Assess and monitor for signs and symptoms of infection   Monitor lab/diagnostic results   Monitor all insertion sites i.e.,  indwelling lines, tubes and drains     Problem: Metabolic/Fluid and Electrolytes - Adult  Goal: Electrolytes maintained within normal limits  01/04/2022 0035 by Corliss Parish, RN  Outcome: Progressing  Flowsheets (Taken 01/03/2022 1910)  Electrolytes maintained within normal limits: Monitor labs and assess patient for signs and symptoms of electrolyte imbalances  01/03/2022 1627 by Marolyn Haller, RN  Outcome: Progressing  Flowsheets (Taken 01/03/2022 1614)  Electrolytes maintained within normal limits:   Monitor labs and assess patient for signs and symptoms of electrolyte imbalances   Administer electrolyte replacement as ordered   Monitor response to electrolyte replacements, including repeat lab results as appropriate     Problem: Hematologic - Adult  Goal: Maintains hematologic stability  01/04/2022 0035 by Corliss Parish, RN  Outcome: Progressing  Flowsheets (Taken 01/03/2022 1910)  Maintains hematologic stability: Assess for signs and symptoms of bleeding or hemorrhage  01/03/2022 1627 by Marolyn Haller, RN  Outcome: Progressing  Flowsheets (Taken 01/03/2022 1614)  Maintains hematologic stability:   Assess for signs and symptoms of bleeding or hemorrhage  Monitor labs for bleeding or clotting disorders     Problem: Occupational Therapy - Adult  Goal: By Discharge: Performs self-care activities at highest level of function for planned discharge setting.  See evaluation for individualized goals.  Description: Occupational Therapy Goals:  Initiated 12/30/2021 to be met within 7-10 days.    1.  Patient will sit EOB with moderate assistance in order to eat meals while sitting up.   2.  Patient will complete bed mobility to roll in order to participate in hygiene needs with moderate assistance .  3.  Patient will perform bathing  with moderate assistance .  4.  Patient will perform toilet transfers with moderate assistance .  5.  Patient will perform all aspects of toileting with moderate assistance .  6.  Patient will participate  in upper extremity therapeutic exercise/activities with minimal assistance/contact guard assist for 8-10 minutes to increase strength/endurance for ADLs.    7.  Patient will utilize energy conservation techniques during functional activities with verbal, visual, and tactile cues.    PLOF: Pt lives in a single story home with her dtr. Reports aides assist with ADLs a few hours a day, 2-3 days a week.      01/03/2022 1402 by Marylin Crosby Dextradeur, OTA  Outcome: Progressing     Problem: Physical Therapy - Adult  Goal: By Discharge: Performs mobility at highest level of function for planned discharge setting.  See evaluation for individualized goals.  Description: Physical Therapy Goals:  Initiated 12/30/2021 to be met within 7-10 days.    1.  Patient will move from supine to sit and sit to supine  and scoot up and down in bed with minimal assistance/contact guard assist.    2.  Patient will transfer from bed to chair and chair to bed with moderate assistance  using the least restrictive device.  3.  Patient will perform sit to stand with moderate assistance .  4.  Patient will ambulate with moderate assistance  for 5 feet with the least restrictive device.     PLOF: Per daughter, pt was ambulating with RW; however, questionable at this time when this was given pt's current sacral wound.      01/03/2022 1236 by Ruta Hinds, PT  Outcome: Progressing

## 2022-01-04 NOTE — Progress Notes (Signed)
[facility]  Progress Note    Patient: [name] Short Age: 66 y.o. DOB: 10-26-1955 MR#: [id number] SSN: [id number]  Date: 01/04/2022       Subjective/24-hour events:     Patient lying in the bed, feels fine.  No new complaints.  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  Wound VAC to be placed.  Continue Lantus and mealtime insulin, continue SSI  Pain control, bowel regimen.  Neuro following.   Continue antibiotics per ID, PICC line in place for outpatient IV antibiotics  PT/OT as tolerated.    Continue supportive care otherwise.      Discussed with case management, awaiting for ARU authorization    Discussed with the patient at bedside and explained about my above plan care.    Case discussed with:  [x] Patient  [x] Family  [x]  Nursing  [] Case Management  DVT Prophylaxis:  [] Lovenox  [] Hep SQ  [x] SCDs  [] Coumadin   [] On Heparin gtt [] PO anticoagulant    Objective:   VS: BP 114/70   Pulse 71   Temp 98.4 F (36.9 C) (Oral)   Resp 18   Ht 4\' 11"  (1.499 m)   Wt 144 lb (65.3 kg)   SpO2 99%   BMI 29.08 kg/m      Tmax/24hrs: Temp (24hrs), Avg:98.5 F (36.9 C), Min:98 F (36.7 C), Max:99 F (37.2 C)    Intake/Output Summary (Last 24 hours) at 01/04/2022 1452  Last data filed at 01/04/2022 1424  Gross per 24 hour   Intake 900 ml   Output 575 ml   Net 325 ml       Gen:  Appears, NAD.  Lungs: Clear, no wheezes   CV: RRR.  Abdomen: Soft, NTTP.  Extremities: Warm, no pitting edema or ischemia.  Neuro:  Awake and alert.  Oriented x3  Back dressing with wound VAC in place    Current Facility-Administered Medications   Medication Dose  Route Frequency    insulin glargine (LANTUS) injection vial 36 Units  36 Units SubCUTAneous Daily    insulin lispro (HUMALOG) injection vial 5 Units  5 Units SubCUTAneous TID WC    therapeutic multivitamin-minerals 1 tablet  1 tablet Oral Daily    vancomycin (VANCOCIN) 1,000 mg in sodium chloride 0.9 % 250 mL (vial-mate) IVPB  1,000 mg IntraVENous Q12H    aspirin chewable tablet 81 mg  81 mg Oral Daily    acetaminophen (TYLENOL) tablet 500 mg  500 mg Oral Q4H PRN    atorvastatin (LIPITOR) tablet 80 mg  80 mg Oral Nightly    lactated ringers IV soln infusion   IntraVENous Continuous    HYDROcodone-acetaminophen (NORCO) 5-325 MG per tablet 1 tablet  1 tablet Oral Q4H PRN    furosemide (LASIX) tablet 40 mg  40 mg Oral Daily    sodium chloride flush 0.9 % injection 5-40 mL  5-40 mL IntraVENous 2 times per day    sodium chloride flush 0.9 % injection 5-40 mL  5-40 mL IntraVENous PRN    0.9 % sodium chloride infusion   IntraVENous PRN  potassium chloride 10 mEq/100 mL IVPB (Peripheral Line)  10 mEq IntraVENous PRN    magnesium sulfate 2000 mg in 50 mL IVPB premix  2,000 mg IntraVENous PRN    ondansetron (ZOFRAN-ODT) disintegrating tablet 4 mg  4 mg Oral Q8H PRN    Or    ondansetron (ZOFRAN) injection 4 mg  4 mg IntraVENous Q6H PRN    polyethylene glycol (GLYCOLAX) packet 17 g  17 g Oral Daily PRN    acetaminophen (TYLENOL) suppository 650 mg  650 mg Rectal Q6H PRN    piperacillin-tazobactam (ZOSYN) 3,375 mg in sodium chloride 0.9 % 50 mL IVPB (mini-bag)  3,375 mg IntraVENous Q8H    glucose chewable tablet 16 g  4 tablet Oral PRN    dextrose bolus 10% 125 mL  125 mL IntraVENous PRN    Or    dextrose bolus 10% 250 mL  250 mL IntraVENous PRN    glucagon (rDNA) injection 1 mg  1 mg SubCUTAneous PRN    dextrose 10 % infusion   IntraVENous Continuous PRN    insulin lispro (HUMALOG) injection vial 0-8 Units  0-8 Units SubCUTAneous TID WC    insulin lispro (HUMALOG) injection vial 0-4 Units  0-4 Units SubCUTAneous Nightly         Labs:    Recent Results (from the past 24 hour(s))   POCT Glucose    Collection Time: 01/03/22  4:10 PM   Result Value Ref Range    POC Glucose 401 (HH) 70 - 110 mg/dL   POCT Glucose    Collection Time: 01/03/22  4:13 PM   Result Value Ref Range    POC Glucose 376 (H) 70 - 110 mg/dL   POCT Glucose    Collection Time: 01/03/22  9:03 PM   Result Value Ref Range    POC Glucose 277 (H) 70 - 110 mg/dL   POCT Glucose    Collection Time: 01/04/22  7:54 AM   Result Value Ref Range    POC Glucose 132 (H) 70 - 110 mg/dL   POCT Glucose    Collection Time: 01/04/22 11:43 AM   Result Value Ref Range    POC Glucose 185 (H) 70 - 110 mg/dL         Signed By: Woodward Ku, MD     January 04, 2022

## 2022-01-05 LAB — CBC WITH AUTO DIFFERENTIAL
Basophils %: 0 % (ref 0–2)
Basophils Absolute: 0 10*3/uL (ref 0.0–0.1)
Eosinophils %: 3 % (ref 0–5)
Eosinophils Absolute: 0.2 10*3/uL (ref 0.0–0.4)
Hematocrit: 27.2 % — ABNORMAL LOW (ref 35.0–45.0)
Hemoglobin: 8.5 g/dL — ABNORMAL LOW (ref 12.0–16.0)
Immature Granulocytes %: 1 % — ABNORMAL HIGH (ref 0.0–0.5)
Immature Granulocytes Absolute: 0.1 10*3/uL — ABNORMAL HIGH (ref 0.00–0.04)
Lymphocytes %: 10 % — ABNORMAL LOW (ref 21–52)
Lymphocytes Absolute: 0.7 10*3/uL — ABNORMAL LOW (ref 0.9–3.6)
MCH: 24.7 pg (ref 24.0–34.0)
MCHC: 31.3 g/dL (ref 31.0–37.0)
MCV: 79.1 FL (ref 78.0–100.0)
MPV: 9.6 FL (ref 9.2–11.8)
Monocytes %: 9 % (ref 3–10)
Monocytes Absolute: 0.6 10*3/uL (ref 0.05–1.2)
Neutrophils %: 78 % — ABNORMAL HIGH (ref 40–73)
Neutrophils Absolute: 5.6 10*3/uL (ref 1.8–8.0)
Nucleated RBCs: 0 /100{WBCs}
Platelets: 307 10*3/uL (ref 135–420)
RBC: 3.44 M/uL — ABNORMAL LOW (ref 4.20–5.30)
RDW: 15.7 % — ABNORMAL HIGH (ref 11.6–14.5)
WBC: 7.2 10*3/uL (ref 4.6–13.2)
nRBC: 0 10*3/uL (ref 0.00–0.01)

## 2022-01-05 LAB — BASIC METABOLIC PANEL
Anion Gap: 6 mmol/L (ref 3.0–18)
BUN/Creatinine Ratio: 34 — ABNORMAL HIGH (ref 12–20)
BUN: 22 mg/dL — ABNORMAL HIGH (ref 7.0–18)
CO2: 29 mmol/L (ref 21–32)
Calcium: 9.9 mg/dL (ref 8.5–10.1)
Chloride: 108 mmol/L (ref 100–111)
Creatinine: 0.65 mg/dL (ref 0.6–1.3)
Est, Glom Filt Rate: >60 mL/min/{1.73_m2} (ref >60)
Glucose: 109 mg/dL — ABNORMAL HIGH (ref 74–99)
Potassium: 3.5 mmol/L (ref 3.5–5.5)
Sodium: 143 mmol/L (ref 136–145)

## 2022-01-05 LAB — POCT GLUCOSE
POC Glucose: 160 mg/dL — ABNORMAL HIGH (ref 70–110)
POC Glucose: 113 mg/dL — ABNORMAL HIGH (ref 70–110)
POC Glucose: 153 mg/dL — ABNORMAL HIGH (ref 70–110)
POC Glucose: 136 mg/dL — ABNORMAL HIGH (ref 70–110)

## 2022-01-05 LAB — VANCOMYCIN LEVEL, RANDOM: Vancomycin Rm: 20 ug/mL (ref 5.0–40.0)

## 2022-01-05 LAB — MAGNESIUM: Magnesium: 1.3 mg/dL — ABNORMAL LOW (ref 1.6–2.6)

## 2022-01-05 MED ORDER — SODIUM CHLORIDE 0.9 % IV SOLN
0.9 % | Freq: Two times a day (BID) | INTRAVENOUS | Status: AC
Start: 2022-01-05 — End: 2022-01-06
  Administered 2022-01-05 – 2022-01-06 (×2): 1000 mg via INTRAVENOUS

## 2022-01-05 MED ORDER — ENOXAPARIN SODIUM 40 MG/0.4ML IJ SOSY
400.4 MG/0.4ML | Freq: Every day | INTRAMUSCULAR | Status: AC
Start: 2022-01-05 — End: 2022-01-06
  Administered 2022-01-05 – 2022-01-06 (×2): 40 mg via SUBCUTANEOUS

## 2022-01-05 MED FILL — PIPERACILLIN SOD-TAZOBACTAM SO 3.375 (3-0.375) G IV SOLR: 3.375 (3-0.375) g | INTRAVENOUS | Qty: 3375

## 2022-01-05 MED FILL — THERAPEUTIC-M PO TABS: ORAL | Qty: 1 | Fill #0

## 2022-01-05 MED FILL — VANCOMYCIN HCL 1 G IV SOLR: 1 g | INTRAVENOUS | Qty: 1000

## 2022-01-05 MED FILL — POLYETHYLENE GLYCOL 3350 17 G PO PACK: 17 g | ORAL | Qty: 1

## 2022-01-05 MED FILL — ATORVASTATIN CALCIUM 40 MG PO TABS: 40 MG | ORAL | Qty: 2

## 2022-01-05 MED FILL — ASPIRIN LOW DOSE 81 MG PO CHEW: 81 MG | ORAL | Qty: 1

## 2022-01-05 MED FILL — LANTUS 100 UNIT/ML SC SOLN: 100 UNIT/ML | SUBCUTANEOUS | Qty: 1

## 2022-01-05 MED FILL — HYDROCODONE-ACETAMINOPHEN 5-325 MG PO TABS: 5-325 MG | ORAL | Qty: 1

## 2022-01-05 MED FILL — LOVENOX 40 MG/0.4ML IJ SOSY: 40 MG/0.4ML | INTRAMUSCULAR | Qty: 0.4

## 2022-01-05 MED FILL — HUMALOG 100 UNIT/ML IJ SOLN: 100 UNIT/ML | INTRAMUSCULAR | Qty: 1

## 2022-01-05 MED FILL — FUROSEMIDE 40 MG PO TABS: 40 MG | ORAL | Qty: 1

## 2022-01-05 NOTE — Plan of Care (Signed)
Problem: Physical Therapy - Adult  Goal: By Discharge: Performs mobility at highest level of function for planned discharge setting.  See evaluation for individualized goals.  Description: Physical Therapy Goals:  Initiated 12/30/2021, re-evaluated 01/05/22 and goals adjusted as needed and to be met within 7-10 days.    1.  Patient will move from supine to sit and sit to supine  and scoot up and down in bed with supervision  2.  Patient will transfer from bed to chair and chair to bed with supervision using the least restrictive device.  3.  Patient will perform sit to stand with supervision.  4.  Patient will ambulate with CGA  for 100 feet with the least restrictive device.   5. Pt will ascend/descend 3 steps with B handrails and CGA.    PLOF: Per daughter, pt was ambulating with RW; however, questionable at this time when this was given pt's current sacral wound.      Outcome: Progressing   PHYSICAL THERAPY RE-EVALUATION    Patient: [name] Short (66 y.o. female)  Date: 01/05/2022  Primary Diagnosis: Failure to thrive in adult [R62.7]  Fall, initial encounter [W19.XXXA]  Fall at home, initial encounter [id number], Y81.448]  Cerebrovascular accident (CVA), unspecified mechanism ([place]) [I63.9]  Procedure(s) (LRB):  SACRAL WOUND DEBRIDEMENT INCISION AND DRAINAGE (N/A) 8 Days Post-Op   Precautions: Fall Risk, Surgical Protocols,  ,  ,  ,  , Spinal Precautions: No Bending, No Lifting, No Twisting,  ,  Patient Goals : to not be in pain    ASSESSMENT :  Pt cleared to participate in PT session, pt received semi-reclined in bed and agreeable to therapy session. Pt continues to have wound vac in place. Based on the objective data described below, the patient presents with decreased endurance, decreased strength, decreased balance reactions, gait deviations, and decreased independence in functional mobility. Pt completing supine to sit with minA, sitting on EOB with good balance, reports has grabber and sock puller at home for  donning/doffing socks. Pt standing x3 reps with CGA_minA. Able to take several forward and side steps with CGA near bed. Then agreeable to ambulate to hall x15 feet with slow gait speed. Pt returned to supine in bed with modA. Pt positioned for comfort and educated to call for assist before getting up, pt verbalized understanding. Pt left with all needs met and call bell in reach. RN notified of position and participation.       DEFICITS/IMPAIRMENTS:    , Body Structures, Functions, Activity Limitations Requiring Skilled Therapeutic Intervention: Decreased functional mobility ;Decreased strength;Decreased ROM;Increased pain;Decreased endurance;Decreased posture;Decreased balance    Patient will benefit from skilled intervention to address the above impairments.  Patient's rehabilitation potential/Therapy Prognosis: Good.  Factors which may influence rehabilitation potential include:   [x]          None noted  []          Mental ability/status  []          Medical condition  []          Home/family situation and support systems  []          Safety awareness  []          Pain tolerance/management  []          Other:      PLAN :  Recommendations and Planned Interventions:   [x]            Bed Mobility Training             [  x]    Neuromuscular Re-Education  [x]            Transfer Training                   []     Orthotic/Prosthetic Training  [x]            Gait Training                          []     Modalities  [x]            Therapeutic Exercises           []     Edema Management/Control  [x]            Therapeutic Activities            [x]     Family Training/Education  [x]            Patient Education  []            Other (comment):    Frequency/Duration: Patient will be followed by physical therapy to address goals, 1-2 times per day/3-5 days per week to address goals.    Further Equipment Recommendations for Discharge: rolling walker    AMPAC:    17    Current research shows that an AM-PAC score of 17 (13 without stairs) or less  is not associated with a discharge to the patient's home setting. Based on an AM-PAC score and their current functional mobility deficits, it is recommended that the patient have 5-7 sessions per week of Physical Therapy at d/c to increase the patient's independence.  Currently, this patient demonstrates the potential endurance, and/or tolerance for 3 hours of therapy each day at d/c.        This AMPAC score should be considered in conjunction with interdisciplinary team recommendations to determine the most appropriate discharge setting. Patient's social support, diagnosis, medical stability, and prior level of function should also be taken into consideration.     SUBJECTIVE:   Patient stated "I don't want to today."    OBJECTIVE DATA SUMMARY:   Hospital course since last seen and reason for re-evaluation:Pt due for re-evaluation, pt able to ambulate x15 feet this session, improvements in pain. Pt would continue to benefit from skilled PT to continue to progress towards goals.   Past Medical History:   Diagnosis Date    Anemia     Arthritis     Chronic pain     Legs and Shoulders    Diabetes ([place])     Exposure to asbestos     History of blood transfusion     History of colon polyps     HTN (hypertension)     Hx of blood clots     DVT    Hyperlipemia     Menopause     Pulmonary nodule     Serum calcium elevated     Sleep apnea     not using cpap    Stroke [facility])      Past Surgical History:   Procedure Laterality Date    CESAREAN SECTION  1987    COLONOSCOPY N/A 01/13/2017    COLONOSCOPY performed by Joycelyn Schmid, MD at [place] N/A 12/28/2021    SACRAL WOUND DEBRIDEMENT INCISION AND DRAINAGE performed by Nestor Ramp, DO at [place]  SPINE SURGERY N/A 12/09/2021    CERVICAL THREE/FOUR/FIVE/SIX LAMINECTOMY FUSION; C-ARM; STRYKER; EXT BONE GROWTH STIM; 23 HR performed by Candi Leash, MD at [place]  Situation:  Social/Functional History  Lives With: Daughter  Type of Home: House  Home Layout: One level  Home Access: Stairs to enter with rails  Entrance Stairs - Number of Steps: 3  Bathroom Shower/Tub: Administrator, Civil Service: Soil scientist: Civil engineer, contracting, Grab bars in shower  Bathroom Accessibility: Accessible  Home Equipment: Walker, rolling, Cane  Has the patient had two or more falls in the past year or any fall with injury in the past year?: Unknown  Receives Help From: Home health  ADL Assistance: Needs assistance  Bath: Maximum assistance  Dressing: Maximum assistance  Grooming: Minimal assistance  Feeding: Modified independent   Toileting: Needs assistance  Homemaking Assistance: Needs assistance  Homemaking Responsibilities: No  Ambulation Assistance: Needs assistance  Transfer Assistance: Needs assistance  Active Driver: Yes  EducationForensic psychologist  Occupation: Full time employment  Type of Occupation: Dentist Behavior:  Orientation  Overall Orientation Status: Within Normal Limits  Orientation Level: Oriented X4    Strength:    Strength: Generally decreased, functional    Tone & Sensation:   Tone: Normal  Sensation: Intact    Range Of Motion:  AROM: Within functional limits    Functional Mobility:  Bed Mobility:     Bed Mobility Training  Supine to Sit: Minimum assistance  Sit to Supine: Minimum assistance  Scooting: Maximum assistance  Transfers:     Pharmacologist: Yes  Sit to Stand: Minimum assistance  Stand to Sit: Minimum assistance  Balance:     Balance  Sitting: Intact  Sitting - Static: Good (unsupported)  Sitting - Dynamic: Good (unsupported)  Standing: Impaired  Standing - Static: Good  Standing - Dynamic: Fair    Ambulation/Gait Training:    Gait  Overall Level of Assistance: Minimum assistance;Contact-guard assistance  Speed/Cadence: Slow;Shuffled  Step Length: Right shortened  Gait Abnormalities: Decreased step  clearance  Distance (ft): 15 Feet  Assistive Device: Walker, rolling    Pain:  Pain level pre-treatment: 0/10   Pain level post-treatment: 0/10     Activity Tolerance:   Activity Tolerance: Patient tolerated treatment well  Please refer to the flowsheet for vital signs taken during this treatment.    After treatment:   []          Patient left in no apparent distress sitting up in chair  [x]          Patient left in no apparent distress in bed  [x]          Call bell left within reach  [x]          Nursing notified  []          Caregiver present  []          Bed alarm activated  []          SCDs applied    COMMUNICATION/EDUCATION:   Patient Education  Education Given To: Patient  Education Provided: Role of Therapy;Plan of Care;Transfer Training;Equipment;Fall Prevention Strategies;Energy Conservation;Home Exercise Program  Education Method: Demonstration;Verbal  Barriers to Learning: None  Education Outcome: Verbalized understanding    Thank you for this referral.  Para March, PT  Minutes: 40

## 2022-01-05 NOTE — Progress Notes (Signed)
Patient turned and repositioned for comfort and pressure relief.    0210 Approached patient for turned after hanging antibiotic Therapy. Patient refused after writer explained to her about the importance. Patient still refused with daughter at bedside.    4401 Patient refused to turned after awaking for vitals sign. Daughter remained at bedside.     0600 Patient repositioned and turned after incontinent care provided. Daughter remain in room.

## 2022-01-05 NOTE — Progress Notes (Incomplete)
Drew labs and arranged transport to lab  .

## 2022-01-05 NOTE — Progress Notes (Signed)
[facility]  Progress Note    Patient: [name] AppleHelena Short Age: 66 y.o. DOB: 09-10-1955 MR#: [id number] SSN: [id number]  Date: 01/05/2022       Subjective/24-hour events:     Patient lying in the bed, feels fine.  No new complaints.  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  Wound VAC to be placed.  Continue Lantus and mealtime insulin, continue SSI  Pain control, bowel regimen.  Neuro following.   Continue antibiotics per ID, PICC line in place for outpatient IV antibiotics  PT/OT as tolerated.    Continue supportive care otherwise.      Discussed with case management, awaiting for ARU authorization    Discussed with the patient at bedside and explained about my above plan care.    Case discussed with:  [x] Patient  [x] Family  [x]  Nursing  [] Case Management  DVT Prophylaxis:  [x] Lovenox  [] Hep SQ  [x] SCDs  [] Coumadin   [] On Heparin gtt [] PO anticoagulant    Objective:   VS: BP (!) 140/68   Pulse 76   Temp 98.1 F (36.7 C) (Oral)   Resp 20   Ht 4\' 11"  (1.499 m)   Wt 159 lb 4.8 oz (72.3 kg)   SpO2 99%   BMI 32.17 kg/m      Tmax/24hrs: Temp (24hrs), Avg:98.4 F (36.9 C), Min:97.5 F (36.4 C), Max:99.1 F (37.3 C)    Intake/Output Summary (Last 24 hours) at 01/05/2022 1447  Last data filed at 01/05/2022 1353  Gross per 24 hour   Intake 880 ml   Output 4250 ml   Net -3370 ml       Gen:  Appears, NAD.  Lungs: Clear, no wheezes   CV: RRR.  Abdomen: Soft, NTTP.  Extremities: Warm, no pitting edema or ischemia.  Neuro:  Awake and alert.  Oriented x3  Back dressing with wound VAC in place    Current Facility-Administered  Medications   Medication Dose Route Frequency    vancomycin (VANCOCIN) 1,000 mg in sodium chloride 0.9 % 250 mL (vial-mate) IVPB  1,000 mg IntraVENous Q12H    insulin glargine (LANTUS) injection vial 36 Units  36 Units SubCUTAneous Daily    insulin lispro (HUMALOG) injection vial 5 Units  5 Units SubCUTAneous TID WC    therapeutic multivitamin-minerals 1 tablet  1 tablet Oral Daily    aspirin chewable tablet 81 mg  81 mg Oral Daily    acetaminophen (TYLENOL) tablet 500 mg  500 mg Oral Q4H PRN    atorvastatin (LIPITOR) tablet 80 mg  80 mg Oral Nightly    lactated ringers IV soln infusion   IntraVENous Continuous    HYDROcodone-acetaminophen (NORCO) 5-325 MG per tablet 1 tablet  1 tablet Oral Q4H PRN    furosemide (LASIX) tablet 40 mg  40 mg Oral Daily    sodium chloride flush 0.9 % injection 5-40 mL  5-40 mL IntraVENous 2 times per day    sodium chloride flush 0.9 % injection 5-40 mL  5-40 mL IntraVENous PRN    0.9 % sodium chloride infusion   IntraVENous  PRN    potassium chloride 10 mEq/100 mL IVPB (Peripheral Line)  10 mEq IntraVENous PRN    magnesium sulfate 2000 mg in 50 mL IVPB premix  2,000 mg IntraVENous PRN    ondansetron (ZOFRAN-ODT) disintegrating tablet 4 mg  4 mg Oral Q8H PRN    Or    ondansetron (ZOFRAN) injection 4 mg  4 mg IntraVENous Q6H PRN    polyethylene glycol (GLYCOLAX) packet 17 g  17 g Oral Daily PRN    acetaminophen (TYLENOL) suppository 650 mg  650 mg Rectal Q6H PRN    piperacillin-tazobactam (ZOSYN) 3,375 mg in sodium chloride 0.9 % 50 mL IVPB (mini-bag)  3,375 mg IntraVENous Q8H    glucose chewable tablet 16 g  4 tablet Oral PRN    dextrose bolus 10% 125 mL  125 mL IntraVENous PRN    Or    dextrose bolus 10% 250 mL  250 mL IntraVENous PRN    glucagon (rDNA) injection 1 mg  1 mg SubCUTAneous PRN    dextrose 10 % infusion   IntraVENous Continuous PRN    insulin lispro (HUMALOG) injection vial 0-8 Units  0-8 Units SubCUTAneous TID WC    insulin lispro (HUMALOG) injection vial 0-4 Units  0-4  Units SubCUTAneous Nightly        Labs:    Recent Results (from the past 24 hour(s))   POCT Glucose    Collection Time: 01/04/22  3:49 PM   Result Value Ref Range    POC Glucose 150 (H) 70 - 110 mg/dL   POCT Glucose    Collection Time: 01/04/22  8:43 PM   Result Value Ref Range    POC Glucose 160 (H) 70 - 110 mg/dL   POCT Glucose    Collection Time: 01/05/22  8:05 AM   Result Value Ref Range    POC Glucose 113 (H) 70 - 110 mg/dL   Vancomycin Level, Random    Collection Time: 01/05/22 10:00 AM   Result Value Ref Range    Vancomycin Rm 20.0 5.0 - 40.0 UG/ML   Basic Metabolic Panel    Collection Time: 01/05/22 10:00 AM   Result Value Ref Range    Sodium 143 136 - 145 mmol/L    Potassium 3.5 3.5 - 5.5 mmol/L    Chloride 108 100 - 111 mmol/L    CO2 29 21 - 32 mmol/L    Anion Gap 6 3.0 - 18 mmol/L    Glucose 109 (H) 74 - 99 mg/dL    BUN 22 (H) 7.0 - 18 MG/DL    Creatinine 4.81 0.6 - 1.3 MG/DL    Bun/Cre Ratio 34 (H) 12 - 20      Est, Glom Filt Rate >60 >60 ml/min/1.42m2    Calcium 9.9 8.5 - 10.1 MG/DL   CBC with Auto Differential    Collection Time: 01/05/22 10:00 AM   Result Value Ref Range    WBC 7.2 4.6 - 13.2 K/uL    RBC 3.44 (L) 4.20 - 5.30 M/uL    Hemoglobin 8.5 (L) 12.0 - 16.0 g/dL    Hematocrit 85.6 (L) 35.0 - 45.0 %    MCV 79.1 78.0 - 100.0 FL    MCH 24.7 24.0 - 34.0 PG    MCHC 31.3 31.0 - 37.0 g/dL    RDW 31.4 (H) 97.0 - 14.5 %    Platelets 307 135 - 420 K/uL    MPV 9.6 9.2 - 11.8 FL    Nucleated RBCs 0.0 0  PER 100 WBC    nRBC 0.00 0.00 - 0.01 K/uL    Neutrophils % 78 (H) 40 - 73 %    Lymphocytes % 10 (L) 21 - 52 %    Monocytes % 9 3 - 10 %    Eosinophils % 3 0 - 5 %    Basophils % 0 0 - 2 %    Immature Granulocytes 1 (H) 0.0 - 0.5 %    Neutrophils Absolute 5.6 1.8 - 8.0 K/UL    Lymphocytes Absolute 0.7 (L) 0.9 - 3.6 K/UL    Monocytes Absolute 0.6 0.05 - 1.2 K/UL    Eosinophils Absolute 0.2 0.0 - 0.4 K/UL    Basophils Absolute 0.0 0.0 - 0.1 K/UL    Absolute Immature Granulocyte 0.1 (H) 0.00 - 0.04 K/UL     Differential Type AUTOMATED     Magnesium    Collection Time: 01/05/22 10:00 AM   Result Value Ref Range    Magnesium 1.3 (L) 1.6 - 2.6 mg/dL   POCT Glucose    Collection Time: 01/05/22 11:31 AM   Result Value Ref Range    POC Glucose 153 (H) 70 - 110 mg/dL         Signed By: Woodward Ku, MD     January 05, 2022

## 2022-01-06 ENCOUNTER — Inpatient Hospital Stay
Admit: 2022-01-06 | Discharge: 2022-01-24 | Disposition: A | Discharge status: Skilled Nursing Facility | Payer: PRIVATE HEALTH INSURANCE | Source: Other Acute Inpatient Hospital | Attending: Emergency Medicine | Admitting: Emergency Medicine

## 2022-01-06 DIAGNOSIS — L89154 Pressure ulcer of sacral region, stage 4: Secondary | ICD-10-CM

## 2022-01-06 DIAGNOSIS — I69354 Hemiplegia and hemiparesis following cerebral infarction affecting left non-dominant side: Secondary | ICD-10-CM

## 2022-01-06 DIAGNOSIS — I639 Cerebral infarction, unspecified: Secondary | ICD-10-CM

## 2022-01-06 LAB — HEMOGLOBIN A1C
Estimated Avg Glucose: 177 mg/dL
Hemoglobin A1C: 7.8 % — ABNORMAL HIGH (ref 4.2–5.6)

## 2022-01-06 LAB — POCT GLUCOSE
POC Glucose: 243 mg/dL — ABNORMAL HIGH (ref 70–110)
POC Glucose: 98 mg/dL (ref 70–110)
POC Glucose: 102 mg/dL (ref 70–110)
POC Glucose: 115 mg/dL — ABNORMAL HIGH (ref 70–110)

## 2022-01-06 MED ORDER — INSULIN LISPRO 100 UNIT/ML IJ SOLN
100 UNIT/ML | Freq: Three times a day (TID) | INTRAMUSCULAR | Status: AC
Start: 2022-01-06 — End: 2022-01-25
  Administered 2022-01-09: 20:00:00 4 [IU] via SUBCUTANEOUS
  Administered 2022-01-09 – 2022-01-11 (×3): 2 [IU] via SUBCUTANEOUS
  Administered 2022-01-13 – 2022-01-14 (×2): 4 [IU] via SUBCUTANEOUS
  Administered 2022-01-17 – 2022-01-18 (×2): 2 [IU] via SUBCUTANEOUS
  Administered 2022-01-19: 21:00:00 4 [IU] via SUBCUTANEOUS
  Administered 2022-01-20: 17:00:00 2 [IU] via SUBCUTANEOUS
  Administered 2022-01-20: 22:00:00 4 [IU] via SUBCUTANEOUS
  Administered 2022-01-21: 22:00:00 2 [IU] via SUBCUTANEOUS
  Administered 2022-01-23: 17:00:00 4 [IU] via SUBCUTANEOUS
  Administered 2022-01-23: 21:00:00 2 [IU] via SUBCUTANEOUS
  Administered 2022-01-24: 16:00:00 6 [IU] via SUBCUTANEOUS

## 2022-01-06 MED ORDER — ATORVASTATIN CALCIUM 80 MG PO TABS
80 MG | ORAL_TABLET | Freq: Every evening | ORAL | 3 refills | Status: AC
Start: 2022-01-06 — End: ?

## 2022-01-06 MED ORDER — SODIUM CHLORIDE 0.9 % IV SOLN
0.9 % | Freq: Two times a day (BID) | INTRAVENOUS | Status: AC
Start: 2022-01-06 — End: 2022-01-10
  Administered 2022-01-07 – 2022-01-10 (×7): 1000 mg via INTRAVENOUS

## 2022-01-06 MED ORDER — VANCOMYCIN (VANCOCIN) INFUSION (OUTPATIENT ONLY)
Freq: Three times a day (TID) | Status: DC
Start: 2022-01-06 — End: 2022-01-23

## 2022-01-06 MED ORDER — PIPERACILLIN SOD-TAZOBACTAM SO 4.5 (4-0.5) G IV SOLR
4.5 (4-0.5) g | Freq: Once | INTRAVENOUS | Status: DC
Start: 2022-01-06 — End: 2022-01-06

## 2022-01-06 MED ORDER — FERROUS SULFATE 325 (65 FE) MG PO TABS
325 (65 Fe) MG | Freq: Every day | ORAL | Status: AC
Start: 2022-01-06 — End: ?
  Administered 2022-01-06 – 2022-01-08 (×3): 325 mg via ORAL

## 2022-01-06 MED ORDER — GLUCAGON HCL RDNA (DIAGNOSTIC) 1 MG IJ SOLR
1 MG | INTRAMUSCULAR | Status: AC | PRN
Start: 2022-01-06 — End: 2022-01-25

## 2022-01-06 MED ORDER — DEXTROSE 10 % IV BOLUS
INTRAVENOUS | Status: AC | PRN
Start: 2022-01-06 — End: 2022-01-25

## 2022-01-06 MED ORDER — MAGNESIUM OXIDE 400 (240 MG) MG PO TABS
400 (240 Mg) MG | ORAL_TABLET | Freq: Every day | ORAL | Status: DC
Start: 2022-01-06 — End: 2022-01-06

## 2022-01-06 MED ORDER — HYDROCODONE-ACETAMINOPHEN 5-325 MG PO TABS
5-325 MG | ORAL | 0 refills | Status: DC | PRN
Start: 2022-01-06 — End: 2022-01-23

## 2022-01-06 MED ORDER — ENOXAPARIN SODIUM 40 MG/0.4ML IJ SOSY
40 MG/0.4ML | Freq: Every day | INTRAMUSCULAR | Status: AC
Start: 2022-01-06 — End: 2022-01-23

## 2022-01-06 MED ORDER — FERROUS SULFATE 325 (65 FE) MG PO TABS
325 (65 Fe) MG | ORAL_TABLET | Freq: Every day | ORAL | Status: AC
Start: 2022-01-06 — End: ?

## 2022-01-06 MED ORDER — FUROSEMIDE 40 MG PO TABS
40 MG | Freq: Every day | ORAL | Status: AC
Start: 2022-01-06 — End: 2022-01-25
  Administered 2022-01-07 – 2022-01-24 (×18): 40 mg via ORAL

## 2022-01-06 MED ORDER — ASPIRIN 81 MG PO CHEW
81 MG | Freq: Every day | ORAL | Status: AC
Start: 2022-01-06 — End: 2022-01-25
  Administered 2022-01-07 – 2022-01-24 (×18): 81 mg via ORAL

## 2022-01-06 MED ORDER — THERAPEUTIC MULTIVIT/MINERAL PO TABS
Freq: Every day | ORAL | Status: AC
Start: 2022-01-06 — End: ?

## 2022-01-06 MED ORDER — MAGNESIUM OXIDE 400 (240 MG) MG PO TABS
400 (240 Mg) MG | Freq: Every day | ORAL | Status: AC
Start: 2022-01-06 — End: 2022-01-14
  Administered 2022-01-07 – 2022-01-13 (×7): 400 mg via ORAL

## 2022-01-06 MED ORDER — THERAPEUTIC MULTIVIT/MINERAL PO TABS
Freq: Every day | ORAL | Status: AC
Start: 2022-01-06 — End: 2022-01-25
  Administered 2022-01-07 – 2022-01-24 (×18): 1 via ORAL

## 2022-01-06 MED ORDER — INSULIN LISPRO 100 UNIT/ML IJ SOLN
100 UNIT/ML | Freq: Three times a day (TID) | INTRAMUSCULAR | Status: AC
Start: 2022-01-06 — End: 2022-01-23

## 2022-01-06 MED ORDER — POLYETHYLENE GLYCOL 3350 17 G PO PACK
17 g | Freq: Every day | ORAL | Status: AC | PRN
Start: 2022-01-06 — End: 2022-01-25
  Administered 2022-01-07: 13:00:00 17 g via ORAL

## 2022-01-06 MED ORDER — VANCOMYCIN 1500 MG NS 500 ML (PREMIX) IVPB
Freq: Once | Status: DC
Start: 2022-01-06 — End: 2022-01-06

## 2022-01-06 MED ORDER — PIPERACILLIN-TAZOBACTAM (ZOSYN) INFUSION (OUTPATIENT ONLY)
Freq: Three times a day (TID) | Status: DC
Start: 2022-01-06 — End: 2022-01-23

## 2022-01-06 MED ORDER — ASPIRIN 81 MG PO CHEW
81 MG | ORAL_TABLET | Freq: Every day | ORAL | 3 refills | Status: AC
Start: 2022-01-06 — End: 2022-06-20

## 2022-01-06 MED ORDER — MAGNESIUM OXIDE 400 (240 MG) MG PO TABS
400240 (240 Mg) MG | Freq: Every day | ORAL | Status: DC
Start: 2022-01-06 — End: 2022-01-06

## 2022-01-06 MED ORDER — MAGNESIUM SULFATE IN D5W 1-5 GM/100ML-% IV SOLN
1-5 GM/100ML-% | Freq: Once | INTRAVENOUS | Status: DC
Start: 2022-01-06 — End: 2022-01-06

## 2022-01-06 MED ORDER — SODIUM CHLORIDE 0.9 % IV SOLN (MINI-BAG)
0.9 % | Freq: Three times a day (TID) | INTRAVENOUS | Status: DC
Start: 2022-01-06 — End: 2022-01-06
  Administered 2022-01-06: 20:00:00 3375 mg via INTRAVENOUS

## 2022-01-06 MED ORDER — SODIUM CHLORIDE 0.9 % IV SOLN (MINI-BAG)
0.9 % | Freq: Three times a day (TID) | INTRAVENOUS | Status: DC
Start: 2022-01-06 — End: 2022-01-06

## 2022-01-06 MED ORDER — INSULIN GLARGINE 100 UNIT/ML SC SOLN
100 UNIT/ML | Freq: Every day | SUBCUTANEOUS | Status: AC
Start: 2022-01-06 — End: 2022-01-07

## 2022-01-06 MED ORDER — INSULIN LISPRO 100 UNIT/ML IJ SOLN
100 UNIT/ML | Freq: Every evening | INTRAMUSCULAR | Status: AC
Start: 2022-01-06 — End: 2022-01-25
  Administered 2022-01-21 – 2022-01-23 (×2): 4 [IU] via SUBCUTANEOUS

## 2022-01-06 MED ORDER — ENOXAPARIN SODIUM 40 MG/0.4ML IJ SOSY
40 MG/0.4ML | Freq: Every day | INTRAMUSCULAR | Status: AC
Start: 2022-01-06 — End: 2022-01-25
  Administered 2022-01-07 – 2022-01-24 (×18): 40 mg via SUBCUTANEOUS

## 2022-01-06 MED ORDER — MAGNESIUM OXIDE 400 (240 MG) MG PO TABS
400 (240 Mg) MG | ORAL_TABLET | Freq: Every day | ORAL | Status: AC
Start: 2022-01-06 — End: 2022-01-13

## 2022-01-06 MED ORDER — ATORVASTATIN CALCIUM 40 MG PO TABS
40 MG | Freq: Every evening | ORAL | Status: AC
Start: 2022-01-06 — End: 2022-01-25
  Administered 2022-01-07 – 2022-01-24 (×18): 80 mg via ORAL

## 2022-01-06 MED ORDER — DEXTROSE 10 % IV SOLN
10 % | INTRAVENOUS | Status: AC | PRN
Start: 2022-01-06 — End: 2022-01-25

## 2022-01-06 MED ORDER — GLUCOSE 4 G PO CHEW
4 g | ORAL | Status: AC | PRN
Start: 2022-01-06 — End: 2022-01-25

## 2022-01-06 MED ORDER — INSULIN GLARGINE 100 UNIT/ML SC SOLN
100 UNIT/ML | Freq: Every day | SUBCUTANEOUS | 3 refills | Status: DC
Start: 2022-01-06 — End: 2022-01-23

## 2022-01-06 MED ORDER — INSULIN LISPRO 100 UNIT/ML IJ SOLN
100 UNIT/ML | Freq: Three times a day (TID) | INTRAMUSCULAR | Status: AC
Start: 2022-01-06 — End: 2022-01-07
  Administered 2022-01-06 – 2022-01-07 (×3): 5 [IU] via SUBCUTANEOUS

## 2022-01-06 MED ORDER — ACETAMINOPHEN 325 MG PO TABS
325 | ORAL | Status: DC | PRN
Start: 2022-01-06 — End: 2022-01-25

## 2022-01-06 MED ORDER — HYDROCODONE-ACETAMINOPHEN 5-325 MG PO TABS
5-325 | ORAL | Status: DC | PRN
Start: 2022-01-06 — End: 2022-01-25
  Administered 2022-01-06 – 2022-01-24 (×40): 1 via ORAL

## 2022-01-06 MED FILL — PIPERACILLIN SOD-TAZOBACTAM SO 3.375 (3-0.375) G IV SOLR: 3.375 (3-0.375) g | INTRAVENOUS | Qty: 3375

## 2022-01-06 MED FILL — LOVENOX 40 MG/0.4ML IJ SOSY: 40 MG/0.4ML | INTRAMUSCULAR | Qty: 0.4

## 2022-01-06 MED FILL — HYDROCODONE-ACETAMINOPHEN 5-325 MG PO TABS: 5-325 MG | ORAL | Qty: 1

## 2022-01-06 MED FILL — MAGNESIUM OXIDE -MG SUPPLEMENT 400 (240 MG) MG PO TABS: 400 (240 Mg) MG | ORAL | Qty: 1

## 2022-01-06 MED FILL — VANCOMYCIN HCL 1 G IV SOLR: 1 g | INTRAVENOUS | Qty: 1000

## 2022-01-06 MED FILL — HUMALOG 100 UNIT/ML IJ SOLN: 100 UNIT/ML | INTRAMUSCULAR | Qty: 1

## 2022-01-06 MED FILL — MAGNESIUM SULFATE IN D5W 1-5 GM/100ML-% IV SOLN: 1-5 GM/100ML-% | INTRAVENOUS | Qty: 100

## 2022-01-06 MED FILL — ATORVASTATIN CALCIUM 40 MG PO TABS: 40 MG | ORAL | Qty: 2

## 2022-01-06 MED FILL — THERAPEUTIC-M PO TABS: ORAL | Qty: 1 | Fill #0

## 2022-01-06 MED FILL — FUROSEMIDE 40 MG PO TABS: 40 MG | ORAL | Qty: 1

## 2022-01-06 MED FILL — ASPIRIN LOW DOSE 81 MG PO CHEW: 81 MG | ORAL | Qty: 1

## 2022-01-06 MED FILL — PIPERACILLIN SOD-TAZOBACTAM SO 4.5 (4-0.5) G IV SOLR: 4.5 (4-0.5) g | INTRAVENOUS | Qty: 4500

## 2022-01-06 MED FILL — FERROUS SULFATE 325 (65 FE) MG PO TABS: 325 (65 Fe) MG | ORAL | Qty: 1

## 2022-01-06 MED FILL — LANTUS 100 UNIT/ML SC SOLN: 100 UNIT/ML | SUBCUTANEOUS | Qty: 1

## 2022-01-06 MED FILL — VANCOMYCIN 1500 MG NS 500 ML (PREMIX) IVPB: Qty: 500

## 2022-01-06 NOTE — Progress Notes (Signed)
Infectious Disease progress  Note        Reason: Infected sacral decubiti    Current abx Prior abx   Zosyn, vancomycin since 5/26      Lines:   Picc line LUE since 12/30/21    Assessment :   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     Cervical spondylotic myelopathy: s/p  C3, C4, C5, C6 laminectomy; posterior cervical fusion C3, C4, C5, C6; segmental instrumentation, [employer] type, C3, C4, C5, C6 with lateral mass screws on 12/09/21    Clinical presentation c/w infected necrotic sacral decubiti.    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     Recommendations:    Continue zosyn, vancomycin till 02/06/22  Follow-up surgery recommendations regarding wound care of sacrum  Follow-up neurology/spine surgery recommendations given lower extremity weakness  Needs weekly cbc with diff, renal function panel while on iv antibiotics to monitor for antibiotic side effects     Above plan was discussed in details with patient,  and dr. Sherlynn Stalls. Please call me if any further questions or concerns. Will continue to participate in the care of this patient.    HPI:     no new complaints. Patient denies any subjective fever, chills at home.  She denies any increasing chest pain, shortness of breath, neck pain.  Past Medical History:   Diagnosis Date    Anemia     Arthritis     Chronic pain     Legs and Shoulders    Diabetes (HCC)     Exposure to asbestos     History of blood transfusion     History of colon polyps     HTN (hypertension)     Hx of blood clots      DVT    Hyperlipemia     Menopause     Pulmonary nodule     Serum calcium elevated     Sleep apnea     not using cpap    Stroke [facility])        Past Surgical History:   Procedure Laterality Date    CESAREAN SECTION  1987    COLONOSCOPY N/A 01/13/2017    COLONOSCOPY performed by Levan Hurst, MD at [facility] ENDOSCOPY    COLONOSCOPY      DILATION AND CURETTAGE OF UTERUS  1995    RECTAL SURGERY N/A 12/28/2021    SACRAL WOUND DEBRIDEMENT INCISION AND DRAINAGE performed by Phineas Semen, DO at [facility] MAIN OR    SPINE SURGERY N/A 12/09/2021    CERVICAL THREE/FOUR/FIVE/SIX LAMINECTOMY FUSION; C-ARM; STRYKER; EXT BONE GROWTH STIM; 23 HR performed by Pamalee Leyden, MD at [facility] MAIN OR       @[email or web address] @    Current Facility-Administered Medications   Medication Dose Route Frequency    vancomycin (VANCOCIN) 1,000 mg in sodium chloride 0.9 % 250 mL (vial-mate) IVPB  1,000 mg IntraVENous Q12H    enoxaparin (LOVENOX) injection 40 mg  40 mg SubCUTAneous Daily    insulin glargine (LANTUS) injection vial  36 Units  36 Units SubCUTAneous Daily    insulin lispro (HUMALOG) injection vial 5 Units  5 Units SubCUTAneous TID WC    therapeutic multivitamin-minerals 1 tablet  1 tablet Oral Daily    aspirin chewable tablet 81 mg  81 mg Oral Daily    acetaminophen (TYLENOL) tablet 500 mg  500 mg Oral Q4H PRN    atorvastatin (LIPITOR) tablet 80 mg  80 mg Oral Nightly    lactated ringers IV soln infusion   IntraVENous Continuous    HYDROcodone-acetaminophen (NORCO) 5-325 MG per tablet 1 tablet  1 tablet Oral Q4H PRN    furosemide (LASIX) tablet 40 mg  40 mg Oral Daily    sodium chloride flush 0.9 % injection 5-40 mL  5-40 mL IntraVENous 2 times per day    sodium chloride flush 0.9 % injection 5-40 mL  5-40 mL IntraVENous PRN    0.9 % sodium chloride infusion   IntraVENous PRN    potassium chloride 10 mEq/100 mL IVPB (Peripheral Line)  10 mEq IntraVENous PRN    magnesium sulfate 2000 mg in 50 mL IVPB premix  2,000 mg IntraVENous PRN    ondansetron  (ZOFRAN-ODT) disintegrating tablet 4 mg  4 mg Oral Q8H PRN    Or    ondansetron (ZOFRAN) injection 4 mg  4 mg IntraVENous Q6H PRN    polyethylene glycol (GLYCOLAX) packet 17 g  17 g Oral Daily PRN    acetaminophen (TYLENOL) suppository 650 mg  650 mg Rectal Q6H PRN    piperacillin-tazobactam (ZOSYN) 3,375 mg in sodium chloride 0.9 % 50 mL IVPB (mini-bag)  3,375 mg IntraVENous Q8H    glucose chewable tablet 16 g  4 tablet Oral PRN    dextrose bolus 10% 125 mL  125 mL IntraVENous PRN    Or    dextrose bolus 10% 250 mL  250 mL IntraVENous PRN    glucagon (rDNA) injection 1 mg  1 mg SubCUTAneous PRN    dextrose 10 % infusion   IntraVENous Continuous PRN    insulin lispro (HUMALOG) injection vial 0-8 Units  0-8 Units SubCUTAneous TID WC    insulin lispro (HUMALOG) injection vial 0-4 Units  0-4 Units SubCUTAneous Nightly       Allergies: Lactose    Family History   Problem Relation Age of Onset    Diabetes Other 10045        parent, NOS    Cancer Father     Hypertension Other 35        parent,NOS    Heart Disease Other 5662        parent, NOS    Lung Disease Father      Social History     Socioeconomic History    Marital status: Single     Spouse name: Not on file    Number of children: Not on file    Years of education: Not on file    Highest education level: Not on file   Occupational History    Not on file   Tobacco Use    Smoking status: Every Day     Packs/day: 1.00     Types: Cigarettes, Cigars     Last attempt to quit: 08/2019     Years since quitting: 2.4    Smokeless tobacco: Never   Vaping Use    Vaping Use: Never used   Substance and Sexual Activity    Alcohol use: Yes     Comment: occ  Drug use: Never    Sexual activity: Not on file   Other Topics Concern    Not on file   Social History Narrative    Not on file     Social Determinants of Health     Financial Resource Strain: Low Risk     Difficulty of Paying Living Expenses: Not hard at all   Food Insecurity: No Food Insecurity    Worried About Running Out of  Food in the Last Year: Never true    Ran Out of Food in the Last Year: Never true   Transportation Needs: No Transportation Needs    Lack of Transportation (Medical): No    Lack of Transportation (Non-Medical): No   Physical Activity: Inactive    Days of Exercise per Week: 0 days    Minutes of Exercise per Session: 0 min   Stress: No Stress Concern Present    Feeling of Stress : Not at all   Social Connections: Moderately Isolated    Frequency of Communication with Friends and Family: Three times a week    Frequency of Social Gatherings with Friends and Family: More than three times a week    Attends Religious Services: More than 4 times per year    Active Member of Clubs or Organizations: No    Attends Banker Meetings: Never    Marital Status: Widowed   Catering manager Violence: Not At Risk    Fear of Current or Ex-Partner: No    Emotionally Abused: No    Physically Abused: No    Sexually Abused: No   Housing Stability: Low Risk     Unable to Pay for Housing in the Last Year: No    Number of Places Lived in the Last Year: 1    Unstable Housing in the Last Year: No     Social History     Tobacco Use   Smoking Status Every Day    Packs/day: 1.00    Types: Cigarettes, Cigars    Last attempt to quit: 08/2019    Years since quitting: 2.4   Smokeless Tobacco Never        Temp (24hrs), Avg:98.3 F (36.8 C), Min:98.1 F (36.7 C), Max:98.5 F (36.9 C)    BP (!) 148/88   Pulse 76   Temp 98.4 F (36.9 C) (Oral)   Resp 18   Ht  (1.499 m)   Wt 156 lb 14.4 oz (71.2 kg)   SpO2 98%   BMI 31.69 kg/m     ROS: 12 point ROS obtained in details. Pertinent positives as mentioned in HPI,   otherwise negative    Physical Exam:    General:  Alert, cooperative, no distress, appears stated age.              Head: Normocephalic, without obvious abnormality, atraumatic.   Eyes:  Conjunctivae/corneas clear. EOMs intact.   Nose: Nares normal. No drainage or sinus tenderness.   Neck: Supple, symmetrical, trachea  midline, no adenopathy, thyroid: no enlargement, no carotid bruit and no JVD.   Lungs:   Clear to auscultation bilaterally.   Heart:  Regular rate and rhythm, S1, S2 normal.      Abdomen: Soft, non-tender. Bowel sounds normal.    Extremities: Extremities normal, atraumatic, no cyanosis or edema.   Pulses: 2+ and symmetric all extremities.   Skin:  wound vac sacral decubiti not opened   Neurologic: AAOx2,lethargic. No focal motor or sensory deficit.  Labs: Results:   Chemistry Recent Labs     01/05/22  1000   GLUCOSE 109*   NA 143   K 3.5   CL 108   CO2 29   BUN 22*   CREATININE 0.65        CBC w/Diff Recent Labs     01/05/22  1000   WBC 7.2   RBC 3.44*   HGB 8.5*   HCT 27.2*   PLT 307        Microbiology Results       Procedure Component Value Units Date/Time    Culture, Anaerobic [[id number]]  (Abnormal) Collected: 12/28/21 1030    Order Status: Completed Specimen: Swab from Sacrum Updated: 12/30/21 1137     Special Requests NO SPECIAL REQUESTS        Culture       LIGHT MIXED ANAEROBIC GRAM NEGATIVE RODS          Culture, Wound Aerobic Only [[id number]]  (Abnormal)  (Susceptibility) Collected: 12/28/21 1030    Order Status: Completed Specimen: Swab from Sacrum Updated: 12/31/21 0733     Special Requests NO SPECIAL REQUESTS        Gram stain RARE WBC'S         NO ORGANISMS SEEN        Culture FEW Proteus mirabilis         FEW Enterococcus faecalis       Susceptibility        Proteus mirabilis      BACTERIAL SUSCEPTIBILITY PANEL MIC      amikacin <=2 ug/mL Sensitive      ampicillin <=2 ug/mL Sensitive      ampicillin-sulbactam <=2 ug/mL Sensitive      ceFAZolin <=4 ug/mL Sensitive      cefepime <=1 ug/mL Sensitive      cefOXitin <=4 ug/mL Sensitive      cefTAZidime <=1 ug/mL Sensitive      cefTRIAXone <=1 ug/mL Sensitive      ciprofloxacin <=0.25 ug/mL Sensitive      gentamicin <=1 ug/mL Sensitive      levofloxacin <=0.12 ug/mL Sensitive      meropenem <=0.25 ug/mL Sensitive      piperacillin-tazobactam <=4  ug/mL Sensitive      tobramycin <=1 ug/mL Sensitive      trimethoprim-sulfamethoxazole <=20 ug/mL Sensitive                       Susceptibility        Enterococcus faecalis      BACTERIAL SUSCEPTIBILITY PANEL MIC      ampicillin <=2 ug/mL Resistant      DAPTOmycin 2 ug/mL Sensitive      linezolid 2 ug/mL Sensitive      vancomycin 1 ug/mL Sensitive                           Culture, Blood 2 [[id number]] Collected: 12/28/21 0435    Order Status: Completed Specimen: Blood Updated: 01/03/22 0951     Special Requests --        NO SPECIAL REQUESTS  RIGHT  HAND       Culture NO GROWTH 6 DAYS       Culture, Blood 1 [[id number]] Collected: 12/28/21 0432    Order Status: Completed Specimen: Blood Updated: 01/03/22 0951     Special Requests --        NO SPECIAL REQUESTS  LEFT  HAND  Culture NO GROWTH 6 DAYS       Culture, Wound Aerobic Only [[id number]]  (Abnormal)  (Susceptibility) Collected: 12/28/21 0000    Order Status: Completed Specimen: Sacrum Updated: 01/01/22 0640     Special Requests NO SPECIAL REQUESTS        Gram stain OCCASIONAL WBCS SEEN               2+ APPARENT Gram negative rods                  1+ Gram positive cocci IN PAIRS           Culture       LIGHT PSEUDOMONAS AERUGINOSA                  MODERATE MIXED ENTERIC FLORA          Susceptibility        Pseudomonas aeruginosa      BACTERIAL SUSCEPTIBILITY PANEL MIC      amikacin <=2 ug/mL Sensitive      cefepime <=1 ug/mL Sensitive      cefTAZidime 4 ug/mL Sensitive      ciprofloxacin <=0.25 ug/mL Sensitive      gentamicin <=1 ug/mL Sensitive      levofloxacin 0.5 ug/mL Sensitive      meropenem <=0.25 ug/mL Sensitive      piperacillin-tazobactam <=4 ug/mL Sensitive      tobramycin <=1 ug/mL Sensitive                                       RADIOLOGY:    All available imaging studies/reports in connect care for this admission were reviewed      Disclaimer: Sections of this note are dictated utilizing voice recognition software, which may have resulted in  some phonetic based errors in grammar and contents. Even though attempts were made to correct all the mistakes, some may have been missed, and remained in the body of the document. If questions arise, please contact our department.    Dr. Raiford Simmonds, Infectious Disease Specialist  [phone]  January 06, 2022  9:22 AM

## 2022-01-06 NOTE — Plan of Care (Signed)
Physical Therapy Progress Note    Time In: 1345  Time Out: 1400    Re-attempt at 1440    Attempted to initiate PT evaluation per MD order.  Pt and daughter agreeable to interview and history was obtained re: home environment and PLOF.  However, pt and daughter reflect that the pt has not yet eaten lunch.  Pt's nursing staff notified and was already aware and speaking with kitchen staff.  Unable to initiate mobility assessment as pt has diabetes and had not yet eaten lunch.  Pt's nurse had checked pt's blood glucose and was warming a meal for the patient.  PT re-attempted to initiate evaluation at 1440.  Pt was eating lunch at this time with daughter reflecting that the patient requires increased time to eat.  Will re-attempt as able.    Rosario Adie PT, DPT

## 2022-01-06 NOTE — Discharge Instructions (Addendum)
DISCHARGE SUMMARY from Nurse    PATIENT INSTRUCTIONS:    After general anesthesia or intravenous sedation, for 24 hours or while taking prescription Narcotics:  Limit your activities  Do not drive and operate hazardous machinery  Do not make important personal or business decisions  Do  not drink alcoholic beverages  If you have not urinated within 8 hours after discharge, please contact your surgeon on call.    Report the following to your surgeon:  Excessive pain, swelling, redness or odor of or around the surgical area  Temperature over 100.5  Nausea and vomiting lasting longer than 4 hours or if unable to take medications  Any signs of decreased circulation or nerve impairment to extremity: change in color, persistent  numbness, tingling, coldness or increase pain  Any questions    What to do at Home:  Recommended activity: activity as tolerated and no driving for today,  discharge to  ARU    If you experience any of the following symptoms  nausea, vomiting, diarrhea, shortness of breath, fever over 101, dizziness, fainting, , please follow up with  primary care physician or dial 911 for emergencies.    *  Please give a list of your current medications to your Primary Care Provider.    *  Please update this list whenever your medications are discontinued, doses are      changed, or new medications (including over-the-counter products) are added.    *  Please carry medication information at all times in case of emergency situations.    These are general instructions for a healthy lifestyle:    No smoking/ No tobacco products/ Avoid exposure to second hand smoke  Surgeon General's Warning:  Quitting smoking now greatly reduces serious risk to your health.    Obesity, smoking, and sedentary lifestyle greatly increases your risk for illness    A healthy diet, regular physical exercise & weight monitoring are important for maintaining a healthy lifestyle    You may be retaining fluid if you have a history of heart  failure or if you experience any of the following symptoms:  Weight gain of 3 pounds or more overnight or 5 pounds in a week, increased swelling in our hands or feet or shortness of breath while lying flat in bed.  Please call your doctor as soon as you notice any of these symptoms; do not wait until your next office visit.        The discharge information has been reviewed with the patient.  The patient verbalized understanding.  Discharge medications reviewed with the patient and appropriate educational materials and side effects teaching were provided.  ___________________________________________________________________________________________________________________________________    Patient armband removed and shredded

## 2022-01-06 NOTE — Progress Notes (Addendum)
Pharmacy Note     Pharmacy was consulted to dose Zosyn for treatment of Infected Sacral Decubitus. Per [facility] Policy, Zosyn will be continued as 3.375 gm q8h to be infused over 240 mins.  Estimated Creatinine Clearance: Estimated Creatinine Clearance: 81 mL/min (based on SCr of 0.65 mg/dL).  Dialysis Status, AKI, CKD: --    BMI:  There is no height or weight on file to calculate BMI.    Rationale for Adjustment:  [facility] B-Lactam extended infusion policy    Pharmacy will continue to monitor and adjust dose as necessary.      Please call with any questions.    Thank you,  Cloyce Blankenhorn, RPH

## 2022-01-06 NOTE — Consults (Signed)
Room 179: wound care orders placed on chart:  Wound Care Orders for Hospital Sacrum: Wound vac dressing first application & dressing changes by unit staff nurses every Monday, Wednesday, & Friday on day shift. Measure wound size & document with each dressing change. Settings: low continuous suction. Upon discharge from [facility], place hospital machine in dirty utility room under white laminated sign. May apply daily saline dressing if home wound vac unavailable.  Wound Care Orders for Home Health Care if needed: Skilled home health nursing care for Wound vac dressing changes three times per week, settings continuous suction.  Wound Care Orders for SNF if needed: Remove wound vac dressing upon discharge from [facility], place saline dressing in wound, SNF will place their wound vac when pt arrives to their facility.  Will turn over care to nursing staff at this time.  Julieta Rogalski Fredric Mare BSN, RN, [employer], C[employer]

## 2022-01-06 NOTE — Progress Notes (Signed)
Discussed with the patient and all questioned fully answered. She will call me if any problems arise.Patient transferred to ARU with PICC line in place.

## 2022-01-06 NOTE — H&P (Signed)
[facility]  [address], [place] [address]     INPATIENT REHABILITATION  HISTORY AND PHYSICAL      Name: [name] Short CSN: [id number]   Age: 66 y.o.  MRN: [id number]   Sex: female Admit Date: 01/06/2022     PCP: Elly Modena Deatherage, APRN - NP         Subjective:     Patient seen and examined.    History of the Present Illness: This is a 66 year old African-American female with a history of diabetes and hypertension and a prior CVA who presented to the ED with failure to thrive.  Patient gave history of progressive generalized weakness.  Patient had a recent cervical laminectomy done by Dr. Ma Rings.  Patient was noted to have a sacral decubitus ulcer which as per the daughter was only a week or 2 old and had progressed quickly.  Patient was admitted.  Spine surgery was consulted and.  MRI of the spine was done.  Spine surgery did not recommend any further intervention.  General surgery saw the patient and patient underwent debridement of the sacral decubitus ulcer.  ID also saw the patient and patient was started on IV antibiotics and a PICC line was placed.  Patient had an episode of some right-sided paresthesias.  Patient was noted to have a acute stroke.  Patient was mobilized.  PT OT saw the patient.  It was felt that patient may benefit from transitioning to the inpatient rehab.  For full details regarding hospital course please refer to chart.    The patient  was noted to have impaired mobility and ADLs. Patient was felt to be a good candidate for acute inpatient rehabilitation. Upon evaluation by Physical Therapy and Occupational Therapy, the patient was recommended for acute inpatient rehabilitation. The patient was discharged and was subsequently admitted to the [facility] for intensive rehabilitation to help recover strength, function and mobility.    I saw the patient for the first time in the inpatient rehab at [facility].  Patient was  sitting in bed in no apparent distress.  Patient is awake and alert and follows commands and responds appropriately.  No history of any chest pain or shortness of breath.  No history of any dizziness or lightheadedness or headaches or visual changes.  Patient complains of some generalized weakness.  No history of any abdominal pain or diarrhea or rectal bleeding.  No history of any rash.  No history of any cough or shortness of breath.      Past Medical History:      Diagnosis Date    Anemia     Arthritis     Chronic pain     Legs and Shoulders    Diabetes (HCC)     Exposure to asbestos     History of blood transfusion     History of colon polyps     HTN (hypertension)     Hx of blood clots     DVT    Hyperlipemia     Menopause     Pulmonary nodule     Serum calcium elevated     Sleep apnea     not using cpap    Stroke [facility])        Past Surgical History:      Procedure Laterality Date    CESAREAN SECTION  1987    COLONOSCOPY N/A 01/13/2017    COLONOSCOPY performed by Joycelyn Schmid, MD at [facility]  ENDOSCOPY    COLONOSCOPY      DILATION AND CURETTAGE OF UTERUS  1995    RECTAL SURGERY N/A 12/28/2021    SACRAL WOUND DEBRIDEMENT INCISION AND DRAINAGE performed by Nestor Ramp, DO at [facility] N/A 12/09/2021    CERVICAL THREE/FOUR/FIVE/SIX LAMINECTOMY FUSION; C-ARM; STRYKER; EXT BONE GROWTH STIM; 23 HR performed by Candi Leash, MD at [facility] MAIN OR       Allergies:  Lactose      Social History:   No history of any tobacco use or alcohol use      Family History:      Problem Relation Age of Onset    Diabetes Other 24        parent, NOS    Cancer Father     Hypertension Other 35        parent,NOS    Heart Disease Other 34        parent, NOS    Lung Disease Father            Transfer Medications (from the Discharge Summary   Prior to Admission medications    Medication Sig Start Date End Date Taking? Authorizing Provider   HYDROcodone-acetaminophen (NORCO) 5-325 MG per tablet Take 1 tablet by mouth every 4  hours as needed for Pain for up to 3 days. Max Daily Amount: 6 tablets 01/06/22 01/09/22  Layne Benton, MD   aspirin 81 MG chewable tablet Take 1 tablet by mouth daily 01/06/22   Layne Benton, MD   enoxaparin (LOVENOX) 40 MG/0.4ML Inject 0.4 mLs into the skin daily 01/07/22   Layne Benton, MD   insulin glargine (LANTUS) 100 UNIT/ML injection vial Inject 36 Units into the skin daily 01/07/22   Layne Benton, MD   insulin lispro (HUMALOG) 100 UNIT/ML SOLN injection vial Inject 5 Units into the skin 3 times daily (with meals) 01/06/22   Layne Benton, MD   atorvastatin (LIPITOR) 80 MG tablet Take 1 tablet by mouth nightly 01/06/22   Layne Benton, MD   ferrous sulfate (IRON 325) 325 (65 Fe) MG tablet Take 1 tablet by mouth daily 01/06/22   Layne Benton, MD   Multiple Vitamins-Minerals (THERAPEUTIC MULTIVITAMIN-MINERALS) tablet Take 1 tablet by mouth daily 01/07/22   Layne Benton, MD   piperacillin-tazobactam (ZOSYN) infusion Infuse 3.375 g intravenously in the morning and 3.375 g at noon and 3.375 g in the evening. Compound per protocol.. 01/06/22 02/06/22  Layne Benton, MD   vancomycin (VANCOCIN) infusion Infuse 1,000 mg intravenously in the morning and 1,000 mg at noon and 1,000 mg in the evening. Compound per pharmacy protocol.. 01/06/22 02/06/22  Layne Benton, MD   magnesium oxide (MAG-OX) 400 (240 Mg) MG tablet Take 1 tablet by mouth daily for 7 days 01/06/22 01/13/22  Layne Benton, MD   acetaminophen (TYLENOL) 500 MG tablet Take 1 tablet by mouth every 4 hours as needed for Fever or Pain    Historical Provider, MD   polyethylene glycol (GLYCOLAX) 17 g packet Take 17 g by mouth 2 times daily    Historical Provider, MD   Continuous Blood Gluc Sensor (FREESTYLE LIBRE 3 SENSOR) MISC  08/01/21   Historical Provider, MD   Jimmy Footman BCISE 2 MG/0.85ML injection Inject 0.85 mLs into the skin every 7 days every  Sunday 09/07/21   Historical Provider, MD   furosemide (LASIX) 40 MG tablet furosemide 40 mg tablet   TAKE 1 TABLET BY MOUTH EVERY DAY 09/23/21  Historical Provider, MD   Glucagon, rDNA, (GLUCAGON EMERGENCY) 1 MG KIT INJECT 1 MILLIGRAM(S) INJECTION EVERY DAY AS NEEDED FOR LOW BLOOD DUGAR 08/18/21   Historical Provider, MD       Review Of Systems: Review of Systems  GENERAL: Patient alert, awake and oriented times 3, able to communicate full sentences and not in distress.   HEENT: No change in vision, no earache, tinnitus, sore throat or sinus congestion.   NECK: No pain or stiffness.   PULMONARY: No shortness of breath, cough or wheeze.   Cardiovascular: no pnd or orthopnea, no CP  GASTROINTESTINAL: No abdominal pain, nausea, vomiting or diarrhea, melena or bright red blood per rectum.   GENITOURINARY: No urinary urgency, hesitancy or dysuria..  Patient states that she urinates frequently  MUSCULOSKELETAL: No back pain, no leg pain  DERMATOLOGIC: No rash, no itching, no lesions.   ENDOCRINE: No polyuria, polydipsia, no heat or cold intolerance. No recent change in weight.   HEMATOLOGICAL: No anemia or easy bruising or bleeding.   NEUROLOGIC: No headache, seizures, numbness, tingling or weakness.           Objective:     Vital Signs:  Patient Vitals for the past 24 hrs:   BP Temp Temp src Pulse Resp   01/06/22 1411 -- -- -- -- 18   01/06/22 1100 129/76 97.6 F (36.4 C) Oral 75 18        There is no height or weight on file to calculate BMI.    Physical Examination:   General:    Sitting in bed in no acute distress.  HEENT:  Pupils equal.  Sclera anicteric.  Conjunctiva pink.  Mucous membranes                           Moist, no ear or nasal discharge  Neck:  Supple.  Trachea midline.  No accessory muscle use.  No thyromegaly.                           No jugular venous distention, no carotid bruit  CV:                  Regular rate and rhythm. S1S2+  Lungs:   Clear to auscultation bilaterally.  No Wheezing or Rhonchi.  No rales.  Abdomen:   Soft, non-tender. Not distended.  Bowel sounds normal.   Extremities: No cyanosis.  No edema. Pulses 1+ b/l  Neurologic: Alert and oriented X 3.  Follows commands, responds appropriately.  Patient has some generalized weakness  Skin:                Warm and dry.  No rashes.   Left arm PICC line is in place  Patient has a sacral decubitus ulcer        Current Medications:  Current Facility-Administered Medications   Medication Dose Route Frequency    [START ON 01/07/2022] aspirin chewable tablet 81 mg  81 mg Oral Daily    atorvastatin (LIPITOR) tablet 80 mg  80 mg Oral Nightly    [START ON 01/07/2022] enoxaparin (LOVENOX) injection 40 mg  40 mg SubCUTAneous Daily    ferrous sulfate (IRON 325) tablet 325 mg  325 mg Oral Daily    [START ON 01/07/2022] furosemide (LASIX) tablet 40 mg  40 mg Oral Daily    HYDROcodone-acetaminophen (NORCO) 5-325 MG per tablet 1 tablet  1 tablet Oral Q4H  PRN    [START ON 01/07/2022] insulin glargine (LANTUS) injection vial 36 Units  36 Units SubCUTAneous Daily    insulin lispro (HUMALOG) injection vial 5 Units  5 Units SubCUTAneous TID WC    [START ON 01/07/2022] magnesium oxide (MAG-OX) tablet 400 mg  400 mg Oral Daily    [START ON 01/07/2022] therapeutic multivitamin-minerals 1 tablet  1 tablet Oral Daily    glucose chewable tablet 16 g  4 tablet Oral PRN    dextrose bolus 10% 125 mL  125 mL IntraVENous PRN    Or    dextrose bolus 10% 250 mL  250 mL IntraVENous PRN    glucagon (rDNA) injection 1 mg  1 mg SubCUTAneous PRN    dextrose 10 % infusion   IntraVENous Continuous PRN    acetaminophen (TYLENOL) tablet 650 mg  650 mg Oral Q4H PRN    polyethylene glycol (GLYCOLAX) packet 17 g  17 g Oral Daily PRN    insulin lispro (HUMALOG) injection vial 0-8 Units  0-8 Units SubCUTAneous TID WC    insulin lispro (HUMALOG) injection vial 0-4 Units  0-4 Units SubCUTAneous Nightly    vancomycin (VANCOCIN) 1,000 mg in sodium chloride 0.9 % 250 mL (vial-mate) IVPB  1,000 mg IntraVENous Q12H     piperacillin-tazobactam (ZOSYN) 3,375 mg in sodium chloride 0.9 % 50 mL IVPB (mini-bag)  3,375 mg IntraVENous Q8H       Functional Assessment:     Occupational Therapy   Prior Level of Function  Pre-Admission Screen  Post-Admission Evaluation   Eating   Independent Eating   Supervision Eating      Grooming   Independent Grooming   Supervision Grooming      Upper Body Dressing   Independent Upper Body Dressing   {Maximum assistance Upper Body Dressing      Lower Body Dressing   Independent Lower Body Dressing   {Totally dependent Lower Body Dressing      Bladder Management   Independent Bladder Management   Modified independence Toileting      Bowel Management   Independent Bowel Management   Modified independence      Physical Therapy   Prior Level of Function  Pre-Admission Screen  Post-Admission Evaluation   Ambulation   Independent Ambulation   Maximum assistance        Bed Mobility   Independent Bed Mobility   Moderate assistance        Supine to Sit   Independent Supine to Sit   Maximum assistance     Sit to Stand   Independent Sit to Stand   Minimum assistance     Bed/Chair Transfers   Independent Bed/Chair Transfers   Maximum assistance     Toilet Transfers   Independent Toilet Transfers   Maximum assistance Museum/gallery conservator and Language Pathology  Post-Admission Evaluation                    Legend:   7 - Independent   6 - Modified Independent   5 - Standby Assistance / Supervision / Set-up   4 - Minimum Assistance / Contact Guard Assistance   3 - Moderate Assistance   2 - Maximum Assistance   1 - Total Assistance / Dependent       Labs on Admission:  Recent Results (from the past 24 hour(s))   POCT Glucose    Collection Time: 01/05/22  9:31 PM   Result Value  Ref Range    POC Glucose 243 (H) 70 - 110 mg/dL   POCT Glucose    Collection Time: 01/06/22  7:56 AM   Result Value Ref Range    POC Glucose 98 70 - 110 mg/dL   POCT Glucose    Collection Time: 01/06/22 11:42 AM   Result Value Ref Range     POC Glucose 102 70 - 110 mg/dL   Hemoglobin A1c    Collection Time: 01/06/22 12:13 PM   Result Value Ref Range    Hemoglobin A1C 7.8 (H) 4.2 - 5.6 %    eAG 177 mg/dL           Assessment:     Primary Rehabilitation Diagnosis impaired mobility and ADLs in the setting of 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    Other Co-Morbid Conditions managed in Rehab   Cervical spondylosis with myelopathy, status post laminectomy and fusion of C3-C6 and external bone growth stimulator placement on Dec 09, 2021  Infected sacral decubitus ulcer-status post debridement  Type 2 diabetes with hyperglycemia  Hypertension  Hypomagnesemia  Anemia      Willingness to participate in the program: Good      Rehabilitation Potential: Fair      Plan:     1. Medical Issues being followed closely:    [x]   Fall and safety precautions     [x]   Wound Care     [x]   Bowel and Bladder Function     [x]   Fluid Electrolyte and Nutrition Balance     [x]   Pain Control      2. Issues that 24 hour rehabilitation nursing is following:    [x]   Fall and safety precautions     [x]   Wound Care     [x]   Bowel and Bladder Function     [x]   Fluid Electrolyte and Nutrition Balance     [x]   Pain Control      [x]   Assistance with and education on in-room safety with transfers to and from the bed, wheelchair, toilet and shower.      3. Acute rehabilitation plan of care:    [x]   Patient to be evaluated and treated by:           [x]   Physical Therapy           [x]   Occupational Therapy           [x]   Speech Therapy     []   Hold Rehab until further notice     5. Medications:    [x]   MAR Reviewed     [x]   Continue Present Medications     6. Chemical DVT Prophylaxis:      [x]   Enoxaparin     []   Unfractionated Heparin     []   Warfarin     []   NOAC     []   Aspirin     []   None     7. Mechanical DVT Prophylaxis:      [x]   TED Stockings     []   Sequential Compression Device     []   None     8. GI Prophylaxis:      []   PPI     []   H2 Blocker     [x]   None / Not  indicated     9. Code status:    [x]   Full code     []   Partial code     []   Do not intubate     []   Do not resuscitate       11. Rehabilitation program and expectations from patient, as well as medical issues discussed with the patient.  - The patient is being admitted to a comprehensive acute inpatient rehabilitation program  consisting of at least 180 minutes a day, 5 out of 7 days a week of  combined physical, and Occupational Therapy and speech therapy.  - The patient's prognosis for significant practical improvement within a reasonable period of time appears to be good.   - The estimated length of stay is 14 days.  - The patient/family has a good understanding of our discharge process. The patient has potential to make improvement and is in need of at least two of the following multidisciplinary therapies including but not limited to physical, occupational, speech, nutritional services, wound care, prosthetics and orthotics. The patient is expected to be able to return to home with home health therapy and family support.  - Given the patient's multiple co-morbidities and risk for further medical complications, rehabilitation services could not be safely provided at a lower level of care such as a skilled nursing facility.    - Physical therapy for therapeutic exercise, progressive mobility, gait training, transfer training, bed mobility training, patient and family education, and wheelchair mobility training. Physical therapy goals to address - extremity function, range of motion, balance, safety awareness, independence in transfers, activity tolerance, independence in bed mobility, and independence in ambulation.  - Occupational therapy for self-care home management, transfer training, therapeutic exercise, activity, wheelchair mobility training. Occupational therapy goals to address - extremity function, cognition, balance, activity tolerance, independence in functional transfers, range of motion, safety  awareness, independence in ADL  and independence in home management skills.  - Speech and Language Pathology for cognitive training, dysphagia therapy, speech and language communication therapy. Goals for speech therapy to address cognition, expression of language, comprehension, safety awareness and safe swallow.  - Specialized 24 hour rehabilitation nursing care for bowel and bladder retraining, disease management, pain management, pressure ulcer prevention and management per policy, education on pressure relief techniques, embolism prevention, nutrition management, hydration management, transfer training and   medication distribution.    - Nutrition and Dietary services will be obtained for assessment of adequate calorie needs, hydration and calorie counts as appropriate.  - Social work services for patient and family counseling and safe discharge planning.       MEDICAL PLAN:    -impaired mobility and ADLs in the setting of 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   Continue aspirin and high intensity statin.  Continue PT OT and speech therapy    -Cervical spondylosis with myelopathy, status post laminectomy and fusion of C3-C6 and external bone growth stimulator placement on Dec 09, 2021-PT and OT.  Judicious pain control.  Bowel regimen      -Infected sacral decubitus ulcer-status post debridement.  Continue wound care.  Continue IV antibiotics as per ID recommendations.        -Type 2 diabetes with hyperglycemia.  Continue Lantus insulin and prandial insulin and sliding scale insulin.  Monitor sugars      -Hypertension-well controlled off medications      -Hypomagnesemia-continue to supplement p.o. magnesium      -Anemia-continue ferrous sulfate, monitor    I discussed these recommendations with the patient and daughter at bedside and they verbalized understanding and agreed.  I also discussed the level of care and patient wishes to be a full  code    Estimated length of stay for this admission 14  days    Anticipated disposition: Home.  The potential to achieve that is good.        PRECAUTIONS:   1. Safety/fall precautions.   2. Deep venous thrombosis precautions.   3. Aspiration precautions.       POTENTIAL BARRIERS TO DISCHARGE:   1. Risk for falls.  2. Current home layout.  3. Family limited in ability to provide constant care.  4. Limited community resources.      RELEVANT CHANGES SINCE PREADMISSION SCREENING: I have compared the patient's medical and functional status at the time of the pre-admission screening and on this post-admission evaluation. The preadmission screen and findings from therapy evaluations both support my post admission physician evaluation, deeming this patient to be an appropriate candidate for the IRF.The patient requires multidisciplinary treatment, physician oversight and intensive therapy not provided at a lower level of care.     By signing this document, I acknowledge that I have personally performed a full physical examination on this patient within 24 hours of admission to this inpatient rehabilitation facility and have determined the patient to be able to tolerate the above course of treatment at an intensive level for a reasonable period of time. I will be completing a detailed individualized plan of care for this patient by day #4 of the patient's stay based upon the Pre-Admission Screen, the History and Physical Evaluation, and the therapy evaluations.        Total clinical care time is 75 minutes, including review of chart including all labs, radiology, past medical history, and discussion with patient. Greater than 50% of my time was spent in coordination of care and counseling.    Dragon medical dictation software was used for portions of this report. Unintended errors may occur.      Signed:    Michel Bickers, MD    January 06, 2022

## 2022-01-06 NOTE — Progress Notes (Addendum)
[name] Short is a 66 y.o.  female admitted on 01/06/2022 from [facility]. Report received from Clerance Lav, RN. Transportation was provided bed, and the patient was transferred to her room via bed . The patient was oriented to her room. Fall risk precautions were discussed with the patient; she was instructed to call for help prior to getting up. The following fall risk precautions were initiated: bed/ chair alarms, door signage, yellow bracelet and socks as well as completion of the [place] tool in the patient's room. Four eyes nurse skin assessment was performed by myself and Taylor,RN. Skin problems noted: pt. Has wound vac on sacral area.    Milus Height, RN

## 2022-01-06 NOTE — Progress Notes (Signed)
ARU/IPR REFERRAL CONTACT NOTE  [facility]      Thank you for the opportunity to review this patient's case for admission to [facility].    Based on our pre-admission screening:     [ x] This patient meets criteria for admission to [facility].    Plan to admit patient to room 179@10 :30am  Please call report to [phone] prior to transfer  Please have completed and signed discharge summary in the chart prior to transfer.     Again, Thank you for this referral. Should you have any questions please do not hesitate to call.     Sincerely,  Kenna Gilbert    Clinical Liaison  [facility]  [phone]

## 2022-01-06 NOTE — Progress Notes (Addendum)
[facility]   Pharmacy Pharmacokinetic Monitoring Service - Vancomycin     Quinnley Colasurdo is a 66 y.o. female starting on vancomycin therapy for infected sacral decub (through 02/06/22 as per ID recommendations). Pharmacy was re-consulted by Dr. Jonah Blue, MD for monitoring and adjustment.    Target Concentration: Goal AUC/MIC 400-600 mg*hr/L    Additional Antimicrobials: Zosyn    Pertinent Laboratory Values:   Wt Readings from Last 1 Encounters:   01/06/22 156 lb 14.4 oz (71.2 kg)     Temp Readings from Last 1 Encounters:   01/06/22 97.6 F (36.4 C) (Oral)     Estimated Creatinine Clearance: 81 mL/min (based on SCr of 0.65 mg/dL).  Recent Labs     01/05/22  1000   CREATININE 0.65   WBC 7.2     Procalcitonin: Not ordered    Pertinent Cultures:  Culture Date Source Results   05/27 Blood x 2 NG   05/27 Wound, Sacral Proteus mirabilis (sens to Zosyn)  Enterococcus faecalis (sens to Vanc)   MRSA Nasal Swab: N/A. Non-respiratory infection.    Plan:  Dosing recommendations based on Bayesian software  Patient's been on Vanc since 12/28/21, last level was yesterday 6/4, 20, last dose this AM 6/5 @0347 .  Continue previous regimen 1000 mg q12h  Anticipated AUC of 516 and trough concentration of 15.5 at steady state  Renal labs as indicated   Vancomycin concentration ordered for 06/06 with AM labs  Pharmacy will continue to monitor patient and adjust therapy as indicated    Thank you for the consult,  Eashan Schipani, [facility]  01/06/2022 12:52 PM

## 2022-01-06 NOTE — Progress Notes (Signed)
Paged Dr. Sherlynn Stalls re: Rehab would like to know if Magnesium can be changed over to PO fully since the bag was not able to be hung prior to patient leaving the unit.     Previously spoke to Dr. Sherlynn Stalls in the morning about the laminectomy dressing not having wound order. He said it can be a dry dressing and Wound Care may be following up on it.   Also let Dr. Sherlynn Stalls know that patient still has not had a BM since 5/26 even though she had miralax yesterday. He said she can continue to receive miralax.

## 2022-01-06 NOTE — Discharge Summary (Signed)
Transfer Summary    Patient: [name] Short MRN: [id number]  CSN: [id number]    Date of Birth: June 23, 1956  Age: 66 y.o.  Sex: female    DOA: 12/26/2021 LOS:  LOS: 10 days   Discharge Date: 01/06/22     Disposition: Transfer to ARU    Admission Diagnoses: Failure to thrive in adult [R62.7]  Fall, initial encounter [W19.XXXA]  Fall at home, initial encounter [phone].[name] Short, N02.725]  Cerebrovascular accident (CVA), unspecified mechanism ([place]) [I63.9]    Discharge Diagnoses:    Infected necrotic sacral ulcer, POA, s/p surgical debridement and I&D 5/27 with Pseudomonas and Enterococcus  Acute Small lacunar infarct in left thalamus   DM 2 with hyperglycemia  Hypertension  Hypercalcemia, resolved  Hypomagnesemia  Cervical spondylosis with myelopathy status post laminectomy and fusion of C3-C6 and external bone growth stimulator placement 12/09/2021  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 No Wheezing/Rales.  Heart:             Regular rate and Rhythm.  Abdomen: Soft, Non distended, Non tender. + Bowel sounds.  Extremities: No edema.  Psych:   Good insight. Not anxious or agitated.  Neurologic:  AA, oriented X 3.  Right lower extremity 3/5, right upper extremity 4/5.  Rest 5/5  Sacral wound VAC present  Cervical dressing clean                                Hospital Course:   [name] Short is a 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 The history was obtained from the patient's daughter. The daughter notes the patient couldn't stand at home and had RUQ pain and she was unable to describe. RUQ pain  started yesterday and family states no known history of this. The daughter notes the pain resolved here. The patient was not able to stand up and has had a recent procedure(cervical decompression and fusion 2 weeks ago) and was evaluated for these symptoms by her spine surgeon Dr. Ma Rings, he does not think her issues are related to her recent surgery per his note from today. The daughter notes the patient has been getting progressively weaker over months. Sh went from a cane to a walker in 6 months. The daughter notes the sacral ulcer is actually only a week or 2 old and has progressed quickly. No nausea or vomiting, no diarrhea or constipation per the patient's daughter     In the ED it was noted the patient had Slight tachycardia at 103 but otherwise normal vitals except for slightly lower BP at 104/56. Labs were remarkable  a potassium of 3.4, chloride of 115, glucose of 398, calcium of 10.6, a hemoglobin of 9.0. The patient was given tylenol, 10 units insulin, 1 liter LR and ativan 1 mg. MRI C/T/L spine were obtained. MRI C spine showed  post cervical change, chronic DDD in the thoracic and lumbar spine, Dr. Ma Rings evaluated the MRI of those aforementioned regions and did not think any findings were contributing to her symptoms at this time.    Patient was admitted to hospital with impression of failure to thrive, sacral ulcer concerning for osteomyelitis.  Was started on empiric antibiotics, ID and surgery was consulted.  Surgery saw the patient recommended debridement.  ID recommended to continue empiric antibiotics.  Patient underwent sacral wound debridement and I&D in the OR.  Patient tolerated procedure well.  Patient was continued on empiric antibiotics.  Postoperatively patient and right-sided paresthesia.  Neurology was consulted and MRI was ordered.  MRI showed small lacunar infarct on the left side.  Neuro recommended aspirin and statin.  Patient followed by ID, cultures were followed and ID  recommended antibiotics with vancomycin and Zosyn till 02/06/2022.  PICC line was placed for long-term IV antibiotics.  PT OT saw the patient recommended rehab placement.  Patient has been accepted to rehab but was waiting for insurance authorization.  Patient has been authorized by insurance for rehab today.  Patient is stable to be transferred to acute rehab today.    Discussed with the patient and also with her daughter over the phone about her discharge plan, follow-up appointments, new medications and side effects.  Patient and daughter completely understood and agreed with my plan.  Answered all the questions and concerns appropriately.    Procedures:   SACRAL WOUND DEBRIDEMENT INCISION AND DRAINAGE    Consults:   Dr. Ma Rings, spine surgery  Dr. Posey Pronto, infectious disease  Dr.Nicole Bing Ree, DO, general surgery  Dr. Humphrey Rolls, neurology    Imaging studies:   MRI Result (most recent):  MRI BRAIN WO CONTRAST 12/30/2021    Narrative  MR BRAIN WITHOUT CONTRAST    HISTORY: Age-indeterminate left thalamic lucency on head CT from 12/27/2021,  possible infarct. Right weakness    COMPARISON: CT 12/27/2021    TECHNIQUE: Axial and sagittal T1W scans, axial T2W scans, axial FLAIR, Swan, and  axial diffusion weighted images.    FINDINGS:    Brain parenchyma: Small acute infarct in left thalamus corresponding to CT  abnormality. No evidence of hemorrhagic transformation. Multiple chronic  infarcts bilateral pons and small infarct left cerebellum. Mild periventricular  and deep white matter FLAIR hyperintensity suggestive chronic microvascular  ischemic disease.    Extra-axial spaces, ventricles, basal cisterns: No extra-axial fluid collections  are identified. Mild prominence of ventricles suggestive of central greater than  cortical volume loss. Basal cisterns are patent.    Orbits and paranasal sinuses: Orbits are within normal limits for technique.  Included paranasal sinuses and mastoid air cells are clear.    Calvarium and  skull base: Calvarium is normal limits. The sella turcica is  within normal limits.    Visualized upper cervical spine: Postsurgical changes to the visualized upper  cervical spine incompletely evaluated.    Impression  Small acute infarct in the left thalamus corresponding to lucency on recent head  CT.    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    White matter changes suggestive of mild chronic microvascular ischemic disease.    Concordant preliminary report provided 12/30/2021 at 1824.        Discharge Medications:     Current Discharge Medication List        START taking these medications    Details   HYDROcodone-acetaminophen (NORCO) 5-325 MG per tablet Take 1 tablet by mouth  every 4 hours as needed for Pain for up to 3 days. Max Daily Amount: 6 tablets  Refills: 0    Comments: Reduce doses taken as pain becomes manageable  Associated Diagnoses: Decubitus ulcer of sacral region, stage 4 (HCC)      aspirin 81 MG chewable tablet Take 1 tablet by mouth daily  Qty: 30 tablet, Refills: 3      enoxaparin (LOVENOX) 40 MG/0.4ML Inject 0.4 mLs into the skin daily      insulin glargine (LANTUS) 100 UNIT/ML injection vial Inject 36 Units into the skin daily  Qty: 10 mL, Refills: 3      insulin lispro (HUMALOG) 100 UNIT/ML SOLN injection vial Inject 5 Units into the skin 3 times daily (with meals)      atorvastatin (LIPITOR) 80 MG tablet Take 1 tablet by mouth nightly  Qty: 30 tablet, Refills: 3      Multiple Vitamins-Minerals (THERAPEUTIC MULTIVITAMIN-MINERALS) tablet Take 1 tablet by mouth daily      piperacillin-tazobactam (ZOSYN) infusion Infuse 3.375 g intravenously in the morning and 3.375 g at noon and 3.375 g in the evening. Compound per protocol..      vancomycin (VANCOCIN) infusion Infuse 1,000 mg intravenously in the morning and 1,000 mg at noon and 1,000 mg in the evening. Compound per pharmacy protocol..      Magnesium oxide 400 mg daily for 7 days     CONTINUE these medications  which have CHANGED    Details   ferrous sulfate (IRON 325) 325 (65 Fe) MG tablet Take 1 tablet by mouth daily  Qty: 30 tablet           CONTINUE these medications which have NOT CHANGED    Details   acetaminophen (TYLENOL) 500 MG tablet Take 1 tablet by mouth every 4 hours as needed for Fever or Pain      polyethylene glycol (GLYCOLAX) 17 g packet Take 17 g by mouth 2 times daily             Continuous Blood Gluc Sensor (FREESTYLE LIBRE 3 SENSOR) MISC       BYDUREON BCISE 2 MG/0.85ML injection Inject 0.85 mLs into the skin every 7 days every Sunday      furosemide (LASIX) 40 MG tablet furosemide 40 mg tablet   TAKE 1 TABLET BY MOUTH EVERY DAY      Glucagon, rDNA, (GLUCAGON EMERGENCY) 1 MG KIT INJECT 1 MILLIGRAM(S) INJECTION EVERY DAY AS NEEDED FOR LOW BLOOD DUGAR           STOP taking these medications       Aspirin (VAZALORE) 81 MG CAPS Comments:   Reason for Stopping:         lisinopril (PRINIVIL;ZESTRIL) 40 MG tablet Comments:   Reason for Stopping:         ascorbic acid (VITAMIN C) 250 MG tablet Comments:   Reason for Stopping:         cyclobenzaprine (FLEXERIL) 5 MG tablet Comments:   Reason for Stopping:         gabapentin (NEURONTIN) 100 MG capsule Comments:   Reason for Stopping:         HUMALOG MIX 75/25 KWIKPEN (75-25) 100 UNIT/ML SUPN injection pen Comments:   Reason for Stopping:         pantoprazole (PROTONIX) 40 MG tablet Comments:   Reason for Stopping:                   DIET:  cardiac diet  and diabetic diet    ACTIVITY: activity as tolerated  Patient needs to be on Fall, aspiration, decubitus precaution.     PT/OT consult   Wound Vac care   Accuchecks  QAC and QHS   PICC line care per routine              DVT prophylaxis     ADDITIONAL INFORMATION: If you experience any of the following symptoms but not limited to Fever, chills, nausea, vomiting, diarrhea, change in mentation, falling, bleeding, shortness of breath, chest pain, please call your primary care physician or return to the emergency room  if you cannot get hold of your doctor:     FOLLOW UP CARE:  Follow-up with 1. Physician at SNF in 1-2 days with Cbc with diff, bmp, mg.                             2. Dr. Nestor Ramp, DO in 2 week                           3.  Dr. Ma Rings, spine surgery 2 weeks                           4.  Dr.Arasho, neurology in 4 weeks    Pt's PCP: Daine Gravel, APRN - NP.    Minutes spent on discharge: >40 minutes spent coordinating this discharge (review instructions/follow-up, prescriptions, preparing report for sign off)    [name] Clement, MD  01/06/2022 9:10 AM    Disclaimer: Sections of this note are dictated using utilizing voice recognition software.  Minor typographical errors may be present. If questions arise, please do not hesitate to contact me or call our department.

## 2022-01-07 LAB — POCT GLUCOSE
POC Glucose: 148 mg/dL — ABNORMAL HIGH (ref 70–110)
POC Glucose: 89 mg/dL (ref 70–110)
POC Glucose: 146 mg/dL — ABNORMAL HIGH (ref 70–110)
POC Glucose: 152 mg/dL — ABNORMAL HIGH (ref 70–110)

## 2022-01-07 LAB — BASIC METABOLIC PANEL
Anion Gap: 3 mmol/L (ref 3.0–18)
BUN/Creatinine Ratio: 27 — ABNORMAL HIGH (ref 12–20)
BUN: 20 mg/dL — ABNORMAL HIGH (ref 7.0–18)
CO2: 31 mmol/L (ref 21–32)
Calcium: 10.6 mg/dL — ABNORMAL HIGH (ref 8.5–10.1)
Chloride: 110 mmol/L (ref 100–111)
Creatinine: 0.74 mg/dL (ref 0.6–1.3)
Est, Glom Filt Rate: >60 mL/min/{1.73_m2} (ref >60)
Glucose: 76 mg/dL (ref 74–99)
Potassium: 3 mmol/L — ABNORMAL LOW (ref 3.5–5.5)
Sodium: 144 mmol/L (ref 136–145)

## 2022-01-07 LAB — CBC WITH AUTO DIFFERENTIAL
Basophils %: 0 % (ref 0–2)
Basophils Absolute: 0 10*3/uL (ref 0.0–0.1)
Eosinophils %: 3 % (ref 0–5)
Eosinophils Absolute: 0.2 10*3/uL (ref 0.0–0.4)
Hematocrit: 26.9 % — ABNORMAL LOW (ref 35.0–45.0)
Hemoglobin: 8.3 g/dL — ABNORMAL LOW (ref 12.0–16.0)
Immature Granulocytes %: 0 % (ref 0.0–0.5)
Immature Granulocytes Absolute: 0 10*3/uL (ref 0.00–0.04)
Lymphocytes %: 11 % — ABNORMAL LOW (ref 21–52)
Lymphocytes Absolute: 0.7 10*3/uL — ABNORMAL LOW (ref 0.9–3.6)
MCH: 24.5 pg (ref 24.0–34.0)
MCHC: 30.9 g/dL — ABNORMAL LOW (ref 31.0–37.0)
MCV: 79.4 FL (ref 78.0–100.0)
MPV: 8.9 FL — ABNORMAL LOW (ref 9.2–11.8)
Monocytes %: 10 % (ref 3–10)
Monocytes Absolute: 0.7 10*3/uL (ref 0.05–1.2)
Neutrophils %: 75 % — ABNORMAL HIGH (ref 40–73)
Neutrophils Absolute: 5.1 10*3/uL (ref 1.8–8.0)
Nucleated RBCs: 0 /100{WBCs}
Platelets: 287 10*3/uL (ref 135–420)
RBC: 3.39 M/uL — ABNORMAL LOW (ref 4.20–5.30)
RDW: 15.9 % — ABNORMAL HIGH (ref 11.6–14.5)
WBC: 6.8 10*3/uL (ref 4.6–13.2)
nRBC: 0 10*3/uL (ref 0.00–0.01)

## 2022-01-07 LAB — VANCOMYCIN LEVEL, RANDOM: Vancomycin Rm: 17.4 ug/mL (ref 5.0–40.0)

## 2022-01-07 LAB — MAGNESIUM: Magnesium: 1.6 mg/dL (ref 1.6–2.6)

## 2022-01-07 MED ORDER — INSULIN LISPRO 100 UNIT/ML IJ SOLN
100 UNIT/ML | Freq: Three times a day (TID) | INTRAMUSCULAR | Status: AC
Start: 2022-01-07 — End: 2022-01-11
  Administered 2022-01-07 – 2022-01-11 (×11): 3 [IU] via SUBCUTANEOUS

## 2022-01-07 MED ORDER — SODIUM CHLORIDE 0.9 % IV SOLN (MINI-BAG)
0.9 % | Freq: Three times a day (TID) | INTRAVENOUS | Status: DC
Start: 2022-01-07 — End: 2022-01-25
  Administered 2022-01-07 – 2022-01-24 (×53): 3375 mg via INTRAVENOUS

## 2022-01-07 MED ORDER — POTASSIUM BICARB-CITRIC ACID 20 MEQ PO TBEF
20 MEQ | Freq: Once | ORAL | Status: AC
Start: 2022-01-07 — End: 2022-01-07
  Administered 2022-01-07: 21:00:00 40 meq via ORAL

## 2022-01-07 MED ORDER — INSULIN GLARGINE 100 UNIT/ML SC SOLN
100 UNIT/ML | Freq: Every day | SUBCUTANEOUS | Status: AC
Start: 2022-01-07 — End: 2022-01-09
  Administered 2022-01-07 – 2022-01-09 (×3): 30 [IU] via SUBCUTANEOUS

## 2022-01-07 MED ORDER — ONDANSETRON 4 MG PO TBDP
4 MG | Freq: Three times a day (TID) | ORAL | Status: AC | PRN
Start: 2022-01-07 — End: 2022-01-25

## 2022-01-07 MED ORDER — POTASSIUM BICARB-CITRIC ACID 20 MEQ PO TBEF
20 MEQ | Freq: Once | ORAL | Status: AC
Start: 2022-01-07 — End: 2022-01-07
  Administered 2022-01-07: 14:00:00 40 meq via ORAL

## 2022-01-07 MED FILL — HYDROCODONE-ACETAMINOPHEN 5-325 MG PO TABS: 5-325 MG | ORAL | Qty: 1

## 2022-01-07 MED FILL — PIPERACILLIN SOD-TAZOBACTAM SO 3.375 (3-0.375) G IV SOLR: 3.375 (3-0.375) g | INTRAVENOUS | Qty: 3375

## 2022-01-07 MED FILL — THERAPEUTIC-M PO TABS: ORAL | Qty: 1

## 2022-01-07 MED FILL — FUROSEMIDE 40 MG PO TABS: 40 MG | ORAL | Qty: 1

## 2022-01-07 MED FILL — ASPIRIN LOW DOSE 81 MG PO CHEW: 81 MG | ORAL | Qty: 1

## 2022-01-07 MED FILL — HUMALOG 100 UNIT/ML IJ SOLN: 100 UNIT/ML | INTRAMUSCULAR | Qty: 1

## 2022-01-07 MED FILL — ATORVASTATIN CALCIUM 40 MG PO TABS: 40 MG | ORAL | Qty: 2

## 2022-01-07 MED FILL — VANCOMYCIN HCL 1 G IV SOLR: 1 g | INTRAVENOUS | Qty: 1000

## 2022-01-07 MED FILL — LOVENOX 40 MG/0.4ML IJ SOSY: 40 MG/0.4ML | INTRAMUSCULAR | Qty: 0.4

## 2022-01-07 MED FILL — LANTUS 100 UNIT/ML SC SOLN: 100 UNIT/ML | SUBCUTANEOUS | Qty: 1

## 2022-01-07 MED FILL — MAGNESIUM OXIDE -MG SUPPLEMENT 400 (240 MG) MG PO TABS: 400 (240 Mg) MG | ORAL | Qty: 1

## 2022-01-07 MED FILL — FERROUS SULFATE 325 (65 FE) MG PO TABS: 325 (65 Fe) MG | ORAL | Qty: 1

## 2022-01-07 MED FILL — EFFER-K 20 MEQ PO TBEF: 20 MEQ | ORAL | Qty: 2

## 2022-01-07 MED FILL — POLYETHYLENE GLYCOL 3350 17 G PO PACK: 17 g | ORAL | Qty: 1

## 2022-01-07 NOTE — Plan of Care (Signed)
Problem: SLP Adult - Disturbed Thought Process  Goal: By Discharge: Demonstrates cognitive skills at highest level of function for planned discharge setting.   See evaluation for individualized goals.  Note:     Long term goals (Initiated 01/07/22 to be accomplished by 01/21/2022)  Patient will:  1. Be oriented x 3 and recall events of the day, supervision.  2. Follow 3 part and complex commands, 90% accuracy.  3.  Listen to 3-4 sentence length paragraphs and respond to questions re: content with 90% accuracy.  4.  List 10-12 items in categories of increasing abstraction, supervision.  5.  Discuss similarities/differences given 2 items, 80-90% accuracy.    Short term goals (by 01/14/22)  Patient will:  1.  Be oriented x 3 and recall events of the day, min assist.  2.  Follow 3 part and complex commands, 70-60% accuracy.  3.  Listen to 3-4 sentence length paragraphs and respond to questions re: content with 70-80% accuracy.  4.  List 10-12 items in categories of increasing abstraction, min assist.  5.  Discuss similarities/differences given 2 items, 70-80% accuracy.    Speech LAnguage Pathology evaluation    Patient: [name] Short (66 y.o. female)  Date: 01/07/2022  Primary Diagnosis: Acute CVA (cerebrovascular accident) (HCC) [I63.9]       Precautions: Standard  PLOF: As per H&P    ASSESSMENT :  Based on the objective data described below, the patient presents with relatively mild impairment of reasoning and recall following recent CVA.    Patient will benefit from skilled intervention to address the above impairments.  Patient's rehabilitation potential/ .  Factors which may influence rehabilitation potential include:   []               None noted  []               Mental ability/status  [x]               Medical condition  [x]               Home/family situation and support systems  [x]               Safety awareness  [x]               Pain tolerance/management  []               Other:      PLAN :  Recommendations and Planned  Interventions:  Recommendations  Total Treatment Time: 45  Frequency/Duration: Patient will be followed by speech-language pathology 1-2 times per day/3-5 days per week to address goals.     SUBJECTIVE:   Patient stated, "I need my pain medicine.  Can you bring it with my lunch" to her nurse.    OBJECTIVE:     Past Medical History:   Diagnosis Date    Anemia     Arthritis     Chronic pain     Legs and Shoulders    Diabetes (HCC)     Exposure to asbestos     History of blood transfusion     History of colon polyps     HTN (hypertension)     Hx of blood clots     DVT    Hyperlipemia     Menopause     Pulmonary nodule     Serum calcium elevated     Sleep apnea     not using cpap    Stroke (HCC)  Past Surgical History:   Procedure Laterality Date    CESAREAN SECTION  1987    COLONOSCOPY N/A 01/13/2017    COLONOSCOPY performed by Levan Hurst, MD at [facility]    COLONOSCOPY      DILATION AND CURETTAGE OF UTERUS  1995    RECTAL SURGERY N/A 12/28/2021    SACRAL WOUND DEBRIDEMENT INCISION AND DRAINAGE performed by Phineas Semen, DO at [facility] MAIN OR    SPINE SURGERY N/A 12/09/2021    CERVICAL THREE/FOUR/FIVE/SIX LAMINECTOMY FUSION; C-ARM; STRYKER; EXT BONE GROWTH STIM; 23 HR performed by Pamalee Leyden, MD at [facility] MAIN OR     Prior Level of Function/Home Situation:  Social/Functional History  Lives With: Family  Type of Home: House  Home Layout: One level  Home Access: Stairs to enter with rails  Entrance Stairs - Number of Steps: 3 STE with wide set rails + 1 threshold step  Entrance Stairs - Rails: Both  Bathroom Shower/Tub: Medical sales representative: Standard (Possibly has a bedside commode)  Oceanographer: Buyer, retail: Environmental consultant, rolling, Rollator, Sock aid, Cane, Cane, quad (suction grab bar  in shower)  Has the patient had two or more falls in the past year or any fall with injury in the past year?: Yes  ADL Assistance: Independent  Bath: Modified independent  Dressing: Modified  independent  Grooming: Modified independent   Feeding: Modified independent   Toileting: Independent  Homemaking Assistance: Independent  Homemaking Responsibilities: Yes  Meal Prep Responsibility: Primary  Laundry Responsibility: Primary  Cleaning Responsibility: Primary  Bill Paying/Finance Responsibility: Primary  Shopping Responsibility: Primary  Dependent Care Responsibility: Primary  Health Care Management: Primary    Mental Status:  Orientation  Overall Orientation Status: Within Normal Limits    Motor Speech:  Oral/Motor  Oral Hygiene: Moist     Language Comprehension and Expression:  Expression  Primary Mode of Expression: Verbal  Verbal Expression  Verbal Expression: Within functional limits  Written Expression  Dominant Hand: Right  Written Expression: Unable to assess  Neuro-Linguistics:  Attention  Attention: Within Functional Limits  Memory  Memory: Within Functional Limits  Problem Solving  Problem Solving: Within Functional Limits  Reasoning  Reasoning: Min-mod assist  Safety/Judgment  Safety/Judgment: Within Functional Limits    Pragmatics:  Pragmatics/Social Functioning  Pragmatics: Within functional limits  Voice:  Patient's voice quality is within functional limits for her age and gender    SPEECH THERAPY DIAGNOSIS:      The severity rating is based on the following outcomes:    Clinical judgment     PAIN:  Pt reports 8/10 pain or discomfort prior to evaluation.  Pt reports 8/10 pain or discomfort post evaluation.     After treatment:   [x]  Patient left in no apparent distress sitting up in chair  []  Patient left in no apparent distress in bed  [x]  Call bell left within reach  [x]   Nursing notified  [x]   Caregiver present  []   Bed alarm activated    COMMUNICATION/EDUCATION:   []  Patient educated regarding compensatory speech/language/comprehension techniques  provided via demonstration, verbalization and teach back of comprehension  [x]  Patient/family have participated as able in goal setting and  plan of care.  []  Patient/family agree to work toward stated goals and plan of care.  []  Patient understands intent and goals of therapy, neutral about participation.  []  Patient unable to participate in goal setting/plan of care secondary to cognition, hearing/vision deficits; education ongoing with interdisciplinary  staff     Thank you for this referral.  Carita Pian, SLP

## 2022-01-07 NOTE — Progress Notes (Signed)
Pharmacy Pharmacokinetic Monitoring Service - Vancomycin    Consulting Provider: Jonah Blue, MD   Indication:  infected sacral decub (through 02/06/22 as per ID recommendations).   Target Concentration: Goal AUC/MIC 400-600 mg*hr/L  Day of Therapy (Rehab): 2 of 31  Additional Antimicrobials: Piperacillin/Tazobactam    Pertinent Laboratory Values:   Temp: 98.4 F (36.9 C)  Recent Labs     01/05/22  1000 01/07/22  0530   BUN 22* 20*   WBC 7.2 6.8     No results for input(s): VANRA in the last 72 hours.    Invalid input(s): VANCP, VANCT, VANCR  Estimated Creatinine Clearance: 71 mL/min (based on SCr of 0.74 mg/dL).    Pertinent Cultures:  Culture Date Source Results   05/27 Blood x 2 NG   05/27 Wound, Sacral Proteus mirabilis (sens to Zosyn)  Enterococcus faecalis (sens to Vanc)     MRSA Nasal Swab: N/A. Non-respiratory infection    Assessment:  Date/Time Current Dose Concentration Timing of Concentration (h) AUC   6/05 1000 mg q12h 17.4 9.2h 415   Note: Serum concentrations collected for AUC dosing may appear elevated if collected in close proximity to the dose administered, this is not necessarily an indication of toxicity    Plan:  Current dosing regimen is therapeutic  Continue current dose of 1000 mg IV q12h  Anticipated AUC of 585 and trough concentration of 18.3 at steady state  Renal labs as indicated   No vancomycin concentration at this time.  Pharmacy will continue to monitor patient and adjust therapy as indicated    Thank you for the consult,    Raeanne Gathers, [facility]  01/07/2022

## 2022-01-07 NOTE — Progress Notes (Signed)
Chaplain conducted an initial consultation and Spiritual Assessment for [facility], who is a 66 y.o.,female. Patient's Primary Language is: [place].   According to the patient's EMR Religious Affiliation is: [employer] [facility].     The reason the Patient came to the hospital is:   Patient Active Problem List    Diagnosis Date Noted    Cervical myelopathy (HCC) 09/23/2021    Obstructive sleep apnea syndrome 09/23/2021    Spinal stenosis of lumbar region 09/23/2021    Cerebrovascular accident (HCC) 09/23/2021    Syncope 09/08/2021    Acute CVA (cerebrovascular accident) (HCC) 01/06/2022    Lacunar infarct, acute (HCC) 12/31/2021    Skin ulcer of sacrum (HCC) 12/28/2021    RUQ pain 12/28/2021    Decubitus ulcer of sacral region, stage 4 (HCC) 12/28/2021    Fall 12/27/2021    Failure to thrive in adult 12/27/2021    Iron deficiency anemia due to chronic blood loss 12/21/2016    Occult blood in stools 12/21/2016    Symptomatic anemia 12/20/2016    Stroke (HCC) 04/23/2013    Diabetes (HCC)         The Chaplain provided the following Interventions:  Initiated a relationship of care and support. With patient in room 179 of the rehab unit. There is an advance directive present.  Listened empathically to patient as she spoke about being here and how she is feeling at this time. Looking to getting better still in the future.  Provided chaplaincy education.  Offered prayer  on patients behalf.   Chart reviewed.    The following outcomes were achieved:  Patient shared limited information about both their medical narrative and spiritual journey/beliefs.  Patient processed feeling about current hospitalization.  Patient expressed gratitude for pastoral care visit.    Assessment:  Patient does not have any religious/cultural needs that will affect patient's preferences in health care.  There are no further spiritual or religious issues which require Spiritual Care Services interventions at this time.       Plan:  Chaplains will continue to  follow and will provide pastoral care on an as needed/requested basis    .Dewaine Oats   Spiritual Care   [phone]

## 2022-01-07 NOTE — Progress Notes (Signed)
Comprehensive Nutrition Assessment    Type and Reason for Visit:  Initial, Wound    Nutrition Recommendations/Plan:   Add supplement: Juven BID (95 kcal, 2.5 gm collagen protein each packet)  Specify in diet order for sugar substitute only  Continue all other nutrition interventions. Encourage/ monitor intake of meals and supplements.      Malnutrition Assessment:  Malnutrition Status:  At risk for malnutrition (Comment) (pt with pressure injury wound) (01/07/22 1349)    Context:  Acute Illness     Findings of the 6 clinical characteristics of malnutrition:  Energy Intake:  Mild decrease in energy intake (Comment)  Weight Loss:  No significant weight loss     Body Fat Loss:  Unable to assess     Muscle Mass Loss:  Unable to assess    Fluid Accumulation:  Mild    Grip Strength:  Not Performed    Nutrition History and Allergies:   Past medical hx: anemia, arthritis, DM, hx of colon polyps, HTN, HLD, stroke, rectal surgery on 12/28/21.    Pt reported having good appetite, fair/good meal intake while in acute care hospital (mostly due to feeling that she was receiving too many CHO and was not on any cardiac diet restrictions).   Wt of 152 lb on 09/21/20;  wt of 156 lb on 01/06/22; stable weight.  No known food allergies     Nutrition Assessment:    Pt admitted to ARU for therapy s/p recent CVA; has hx of DM. Is on cardiac and diabetic diet. Pt stated that she received a sugar packet with her lunch today; will specify in diet order for [employer] to not provide sugar packets, only sugar substitute. Pt reported good appetite; had not always been receiving what she wanted while in acute care hospital and since in ARU. Encouraged pt to ask [facility] about meal/ food alternative options as desired. Pt also with pressure injury; was receiving juven supplement for promotion of wound healing while in hospital; pt asking/ agreeable to receive juven while in ARU; will resume supplement.    Nutrition Related Findings:    BM  5/26.    + edema.   Pertinent labs:  Na 144 wnl,  K 3 low, Mg 1.6 wnl, Ca 10.6 high.   Pertinent meds: lipitor, ferrous sulfate, lasix, 30 units lantus, SSI, mag-ox, effer-K, MVI, antibiotic, glycolax. Wound Type: Pressure Injury, Stage IV, Skin Tears, Deep Tissue Injury       Current Nutrition Intake & Therapies:    Average Meal Intake: 51-75%  Average Supplements Intake: None Ordered  ADULT DIET; Regular; 3 carb choices (45 gm/meal); Low Fat/Low Chol/High Fiber/2 gm Na    Anthropometric Measures:  Height: 4\' 11"  (149.9 cm)  Ideal Body Weight (IBW): 95 lbs (43 kg)    Admission Body Weight: 156 lb 14.4 oz (71.2 kg)  Current Body Weight: 156 lb 14.4 oz (71.2 kg), 165.2 % IBW. Weight Source: Not Specified  Current BMI (kg/m2): 31.7  Usual Body Weight: 152 lb (68.9 kg)  % Weight Change (Calculated): 3.2  Weight Adjustment For: No Adjustment  BMI Categories: Obese Class 1 (BMI 30.0-34.9)    Estimated Daily Nutrient Needs:  Energy Requirements Based On: Formula (MSJ x1.3-1.6)  Weight Used for Energy Requirements: Admission  Energy (kcal/day): 12-06-1992  Weight Used for Protein Requirements: Admission  Protein (g/day): 85-107 (wt x1.2-1.5)  Method Used for Fluid Requirements: 1 ml/kcal  Fluid (ml/day): 0998-3382    Nutrition Diagnosis:   Increased nutrient needs related to  increase demand for energy/nutrients as evidenced by wounds    Nutrition Interventions:   Food and/or Nutrient Delivery: Continue Current Diet, Mineral Supplement, Vitamin Supplement, Start Oral Nutrition Supplement  Nutrition Education/Counseling: Education not indicated  Coordination of Nutrition Care: Continue to monitor while inpatient  Plan of Care discussed with: pt    Goals:     Goals: Meet at least 75% of estimated needs, by next RD assessment       Nutrition Monitoring and Evaluation:   Behavioral-Environmental Outcomes: None Identified  Food/Nutrient Intake Outcomes: Diet Advancement/Tolerance, Food and Nutrient Intake, Supplement Intake,  Vitamin/Mineral Intake  Physical Signs/Symptoms Outcomes: Biochemical Data, Chewing or Swallowing, GI Status, Fluid Status or Edema, Meal Time Behavior, Skin, Weight    Discharge Planning:    Continue current diet, Continue Oral Nutrition Supplement     Brayton El, RD  Contact: [phone]

## 2022-01-07 NOTE — Progress Notes (Signed)
[facility]  [address], [place], [place] [address]     INPATIENT REHABILITATION  DAILY PROGRESS NOTE     Date: 01/07/2022    Name: [name] Short Age / Sex: 66 y.o. / female   CSN: [id number] MRN: [id number]   Admit Date: 01/06/2022 Length of Stay: 1 days     Primary Rehabilitation Diagnosis impaired mobility and ADLs in the setting of 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         Subjective:     I personally saw and evaluated this patient face-to-face today.  Patient is laying in bed in no apparent distress, awake and alert.  Denies any discomfort at this time.  Daughter is at bedside    Objective:     Vital Signs:  Patient Vitals for the past 24 hrs:   BP Temp Temp src Pulse Resp SpO2 Height   01/07/22 1012 -- -- -- -- -- -- 4\' 11"  (1.499 m)   01/07/22 0730 137/70 98 F (36.7 C) Oral 80 14 100 % --   01/06/22 2200 130/70 98.4 F (36.9 C) Oral 81 18 -- --   01/06/22 1645 129/68 98.4 F (36.9 C) Oral 84 18 -- --        Physical Examination:  General:  Awake, alert  Cardiovascular:  S1S2+, RRR  Pulmonary:  CTA b/l  GI:  Soft, BS+, NT, ND  Extremities:  No edema   Patient has a left arm PICC line in place  Patient has a sacral decubitus ulcer    Current Medications:  Current Facility-Administered Medications   Medication Dose Route Frequency    insulin glargine (LANTUS) injection vial 30 Units  30 Units SubCUTAneous Daily    ondansetron (ZOFRAN-ODT) disintegrating tablet 4 mg  4 mg SubLINGual Q8H PRN    aspirin chewable tablet 81 mg  81 mg Oral Daily    atorvastatin (LIPITOR) tablet 80 mg  80 mg Oral Nightly    enoxaparin (LOVENOX) injection 40 mg  40 mg SubCUTAneous Daily    ferrous sulfate (IRON 325) tablet 325 mg  325 mg Oral Daily    furosemide (LASIX) tablet 40 mg  40 mg Oral Daily    HYDROcodone-acetaminophen (NORCO) 5-325 MG per tablet 1 tablet  1 tablet Oral Q4H PRN    insulin lispro (HUMALOG) injection vial 5 Units  5 Units SubCUTAneous TID WC    magnesium oxide  (MAG-OX) tablet 400 mg  400 mg Oral Daily    therapeutic multivitamin-minerals 1 tablet  1 tablet Oral Daily    glucose chewable tablet 16 g  4 tablet Oral PRN    dextrose bolus 10% 125 mL  125 mL IntraVENous PRN    Or    dextrose bolus 10% 250 mL  250 mL IntraVENous PRN    glucagon (rDNA) injection 1 mg  1 mg SubCUTAneous PRN    dextrose 10 % infusion   IntraVENous Continuous PRN    acetaminophen (TYLENOL) tablet 650 mg  650 mg Oral Q4H PRN    polyethylene glycol (GLYCOLAX) packet 17 g  17 g Oral Daily PRN    insulin lispro (HUMALOG) injection vial 0-8 Units  0-8 Units SubCUTAneous TID WC    insulin lispro (HUMALOG) injection vial 0-4 Units  0-4 Units SubCUTAneous Nightly    vancomycin (VANCOCIN) 1,000 mg in sodium chloride 0.9 % 250 mL (vial-mate) IVPB  1,000 mg IntraVENous Q12H    piperacillin-tazobactam (ZOSYN) 3,375 mg in sodium chloride 0.9 % 50  mL IVPB (mini-bag)  3,375 mg IntraVENous Q8H       Allergies:  Allergies   Allergen Reactions    Lactose Other (See Comments)     Intolerance-Pt denies       Lab/Data Review:  Recent Results (from the past 24 hour(s))   POCT Glucose    Collection Time: 01/06/22  5:08 PM   Result Value Ref Range    POC Glucose 115 (H) 70 - 110 mg/dL   POCT Glucose    Collection Time: 01/06/22  9:19 PM   Result Value Ref Range    POC Glucose 148 (H) 70 - 110 mg/dL   Basic Metabolic Panel    Collection Time: 01/07/22  5:30 AM   Result Value Ref Range    Sodium 144 136 - 145 mmol/L    Potassium 3.0 (L) 3.5 - 5.5 mmol/L    Chloride 110 100 - 111 mmol/L    CO2 31 21 - 32 mmol/L    Anion Gap 3 3.0 - 18 mmol/L    Glucose 76 74 - 99 mg/dL    BUN 20 (H) 7.0 - 18 MG/DL    Creatinine 9.600.74 0.6 - 1.3 MG/DL    Bun/Cre Ratio 27 (H) 12 - 20      Est, Glom Filt Rate >60 >60 ml/min/1.7673m2    Calcium 10.6 (H) 8.5 - 10.1 MG/DL   CBC with Auto Differential    Collection Time: 01/07/22  5:30 AM   Result Value Ref Range    WBC 6.8 4.6 - 13.2 K/uL    RBC 3.39 (L) 4.20 - 5.30 M/uL    Hemoglobin 8.3 (L) 12.0 -  16.0 g/dL    Hematocrit 45.426.9 (L) 35.0 - 45.0 %    MCV 79.4 78.0 - 100.0 FL    MCH 24.5 24.0 - 34.0 PG    MCHC 30.9 (L) 31.0 - 37.0 g/dL    RDW 09.815.9 (H) 11.911.6 - 14.5 %    Platelets 287 135 - 420 K/uL    MPV 8.9 (L) 9.2 - 11.8 FL    Nucleated RBCs 0.0 0 PER 100 WBC    nRBC 0.00 0.00 - 0.01 K/uL    Neutrophils % 75 (H) 40 - 73 %    Lymphocytes % 11 (L) 21 - 52 %    Monocytes % 10 3 - 10 %    Eosinophils % 3 0 - 5 %    Basophils % 0 0 - 2 %    Immature Granulocytes 0 0.0 - 0.5 %    Neutrophils Absolute 5.1 1.8 - 8.0 K/UL    Lymphocytes Absolute 0.7 (L) 0.9 - 3.6 K/UL    Monocytes Absolute 0.7 0.05 - 1.2 K/UL    Eosinophils Absolute 0.2 0.0 - 0.4 K/UL    Basophils Absolute 0.0 0.0 - 0.1 K/UL    Absolute Immature Granulocyte 0.0 0.00 - 0.04 K/UL    Differential Type AUTOMATED     Magnesium    Collection Time: 01/07/22  5:30 AM   Result Value Ref Range    Magnesium 1.6 1.6 - 2.6 mg/dL   Vancomycin Level, Random    Collection Time: 01/07/22  5:30 AM   Result Value Ref Range    Vancomycin Rm 17.4 5.0 - 40.0 UG/ML   POCT Glucose    Collection Time: 01/07/22  7:36 AM   Result Value Ref Range    POC Glucose 89 70 - 110 mg/dL   POCT Glucose    Collection  Time: 01/07/22 12:20 PM   Result Value Ref Range    POC Glucose 146 (H) 70 - 110 mg/dL         Assessment:     Primary Rehabilitation Diagnosis impaired mobility and ADLs in the setting of 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     Other Co-Morbid Conditions managed in Rehab   Cervical spondylosis with myelopathy, status post laminectomy and fusion of C3-C6 and external bone growth stimulator placement on Dec 09, 2021  Infected sacral decubitus ulcer-status post debridement  Type 2 diabetes with hyperglycemia  Hypertension  Hypomagnesemia  Anemia    PLAN  -impaired mobility and ADLs in the setting of 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   Continue aspirin and high intensity statin.  Continue PT OT and speech therapy     -Cervical spondylosis with myelopathy,  status post laminectomy and fusion of C3-C6 and external bone growth stimulator placement on Dec 09, 2021-PT and OT.  Judicious pain control.  Bowel regimen        -Infected sacral decubitus ulcer-status post debridement.  Continue wound care.  Continue IV antibiotics as per ID recommendations.          -Type 2 diabetes with hyperglycemia.  Continue Lantus insulin and prandial insulin and sliding scale insulin.  Monitor sugars        -Hypertension-well controlled off medications        -Hypomagnesemia-continue to supplement p.o. magnesium    -Hypokalemia  On January 07, 2022 potassium was 3.0.  Repleted  Recheck BMP in the morning     -Anemia-continue ferrous sulfate, monitor     Fall precautions, aspiration precautions  I discussed with patient and daughter at bedside    Functional Progress:    PHYSICAL THERAPY    ON ADMISSION MOST RECENT   Balance  Balance  Posture: Poor  Sitting - Static: Good  Sitting - Dynamic: Fair  Standing - Static: Poor  Standing - Dynamic: Poor  Poor  Good  Fair  Poor  Poor      Balance  Balance  Posture: Poor  Sitting - Static: Good  Sitting - Dynamic: Fair  Standing - Static: Poor  Standing - Dynamic: Poor  Posture: Poor  Sitting - Static: Good  Sitting - Dynamic: Fair  Standing - Static: Poor  Standing - Dynamic: Poor        Bed Mobility  Maximum assistance  Maximum assistance  Moderate assistance (Pt requird increased encouragement and time to transition to seated position.)  Moderate Assistance  Maximum assistance  Moderate assistance    Bed Mobility  Rolling to Right: Maximum assistance  Rolling to Left: Maximum assistance  Supine to Sit: Maximum assistance  Sit to Stand: Moderate Assistance  Sit to Supine: Maximum assistance  Bed to Chair: Moderate assistance      Wheelchair Mobility  Yes       Wheelchair Mobility  Propulsion: Yes         Ambulation  Rolling Walker  Moderate assistance  Level tile  12 Ambulation  Device: Rolling Walker  Assistance: Moderate assistance  Surface: Level  tile  Distance: 12   Stairs  No       Stairs  Stairs?: No           Functional Progress:    OCCUPATIONAL THERAPY    ON ADMISSION MOST RECENT   Eating  Supervision Eating  Feeding: Supervision   Grooming  Supervision, Setup (sitting EOB) Grooming  Grooming: Supervision, Setup (sitting EOB)   Bathing  UB Bathing Supervision, Setup (sitting EOB)  LB Bathing Maximum assistance Bathing  UB Bathing   UE Bathing: Supervision, Setup (sitting EOB)  LB Bathing   UJWJ([id number])@   Upper Body Dressing  Maximum assistance   Upper Body Dressing  UE Dressing: Maximum assistance   Lower Body Dressing  Maximum assistance Lower Body Dressing  LE Dressing: Maximum assistance   Toileting  Maximum assistance Toileting  Toileting: Maximum assistance   Toilet Transfers  Moderate assistance Toilet Transfers  Toilet Transfer: Moderate assistance   Tub Transfers  Tub Transfers: Not tested  [place] Transfers: Not tested Tub Transfers  Tub Transfers  Tub Transfers: Not tested  SHOWER Transfers  Tub Transfers  Tub Transfers: Not tested     Legend:   7 - Independent   6 - Modified Independent   5 - Standby Assistance / Supervision / Set-up   4 - Minimum Assistance / Contact Guard Assistance   3 - Moderate Assistance   2 - Maximum Assistance   1 - Total Assistance / Dependent             Plan:     1. Justification for continued stay: Fair progression towards established rehabilitation goals.    2. Medical Issues being followed closely:      Fall and safety precautions       Wound Care       Bowel and Bladder Function       Fluid Electrolyte and Nutrition Balance       Pain Control      3. Issues that 24 hour rehabilitation nursing is following:      Fall and safety precautions       Wound Care       Bowel and Bladder Function       Fluid Electrolyte and Nutrition Balance       Pain Control        Assistance with and education on in-room safety with transfers to and from the bed, wheelchair,  toilet and shower.      4. Acute rehabilitation plan of care:      Continue current care and rehab.             Physical Therapy             Occupational Therapy             Speech Therapy       Hold Rehab until further notice     5. Medications:      MAR Reviewed       Continue Present Medications       6. Chemical DVT Prophylaxis:        Enoxaparin       Unfractionated Heparin       Warfarin       NOAC       Aspirin       None     7. Mechanical DVT Prophylaxis:        TED Stockings       Sequential Compression Device       None     8. GI Prophylaxis:        PPI       H2 Blocker       None / Not indicated     9. Code status:Full    Dragon medical dictation software was used for portions of this report. Unintended errors may  occur.      Signed:    Michel Bickers, MD      January 07, 2022

## 2022-01-07 NOTE — Plan of Care (Addendum)
Problem: Occupational Therapy - Adult  Goal: By Discharge: Performs self-care activities at highest level of function for planned discharge setting.  See evaluation for individualized goals.  Description: Occupational Therapy Goals   Long Term Goals  Initiated 01/07/2022 and to be accomplished within 2-3 week(s) 01/28/2022  1. Pt will perform self-feeding with ModI.  2. Pt will perform grooming with S/Setup  3. Pt will perform UB bathing with S/Setup  4. Pt will perform LB bathing with Mina.  5. Pt will perform tub/shower transfer with [facility].  6. Pt will perform UB dressing with S/Setup  7. Pt will perform LB dressing with Mina.  8. Pt will perform toileting task with [facility].  9. Pt will perform toilet transfer with [facility].   10.  Pt will perform an IADL task with good safety and Mina.      Short Term Goals   Initiated 01/07/2022 and to be accomplished within 7 day(s) 01/14/2022  1. Pt will perform self-feeding with Setup.  2. Pt will perform grooming with Setup.  3. Pt will perform UB bathing with Setup.  4. Pt will perform LB bathing with Moda.  5. Pt will perform tub/shower transfer with  [facility].  6. Pt will perform UB dressing with Mina.  7. Pt will perform LB dressing with Moda.  8. Pt will perform toileting task with [facility]a.  9. Pt will perform toilet transfer with Moda.       OCCUPATIONAL THERAPY EVALUATION    Patient: [name] Short 66 y.o.  Date: 01/07/2022  Primary Diagnosis: Acute CVA (cerebrovascular accident) [facility]) [I63.9]    Patient identified with name and DOB: Yes    Past Medical History:   Past Medical History:   Diagnosis Date    Anemia     Arthritis     Chronic pain     Legs and Shoulders    Diabetes (HCC)     Exposure to asbestos     History of blood transfusion     History of colon polyps     HTN (hypertension)     Hx of blood clots     DVT    Hyperlipemia     Menopause     Pulmonary nodule     Serum calcium elevated     Sleep apnea     not using cpap    Stroke [facility])      Precautions: : Fall Risk, Surgical  Protocols (Cervical Spinal Precautions),  ,  ,  ,  , Spinal Precautions: No Bending, No Lifting, No Twisting,  ,      Time In:  0800  Time Out:  0900  Time In:  1100  Time Out:  1130  Time In:  1330  Time Out:  1400  Pain:  Pt reports 7/10 pain or discomfort prior to treatment.    Pt reports 7/10 pain or discomfort post treatment.      SUBJECTIVE:   Patient stated "My but hurts."    OBJECTIVE DATA SUMMARY:   OT introduction performed with purpose of visit discussed.  Pt agreeable to Evaluation.     Therapy Diagnosis:   Difficulty with ADLs  [x]      Difficulty with functional transfers  [x]      Difficulty with ambulation  [x]      Difficulty with IADLs  [x]        Problem List:    Decreased strength B UE  [x]      Decreased strength trunk/core  [x]   Decreased AROM   [x]  Up to 90 degrees 2/2 to spinal precautions   Decreased endurance  [x]      Decreased balance sitting  []      Decreased balance standing  [x]      Pain   [x]      Decreased PROM  []        Functional Limitations:   Decreased independence with ADL  [x]      Decreased independence with functional transfers  [x]      Decreased independence with ambulation  [x]      Decreased independence with IADL  [x]        Previous Functional Level & Home Environment: Pt reports I with ADLs prior to spinal surgery earlier this year.   Lives With: Daughter  Type of Home: House  Home Layout: One level  Home Access: Stairs to enter with rails (3 STE with wide set rails + 1 threshold step)  Entrance Stairs - Number of Steps: 3 STE with wide set rails + 1 threshold step  Entrance Stairs - Rails: Right  Bathroom Shower/Tub: : Standard (Possibly has a bedside commode)  Bathroom Equipment: : , , rolling  Has the patient had two or more falls in the past year or any fall with injury in the past year?: Yes  ADL Assistance: Independent  Bath: Modified independent  Dressing: Modified independent  Grooming: Modified  independent   Feeding: Modified independent   Toileting: Independent  Homemaking Assistance: Independent  Homemaking Responsibilities: Yes  Meal Prep Responsibility: Primary  Laundry Responsibility: Primary  Cleaning Responsibility: Primary  Bill Paying/Finance Responsibility: Primary  Shopping Responsibility: Primary  Dependent Care Responsibility: Primary  Health Care Management: Primary      Barriers to Learning/Limitations:      Compensate with: visual, verbal, tactile, kinesthetic cues/model    Outcome Measures:     Sitting Balance:  Fair  Standing Balance:  Impaired    MMT Initial Assessment   Right Upper Extremity  Left Upper Extremity    UE AROM ROM: up to 90 degrees 2/2 to spinal precautions; MMT :3/5 proximal and 4/5 distal ROM: up to 90 degrees 2/2 to spinal precautions; MMT :3/5 proximal and 4/5 distal    Grip [facility]  WFL   0/5 No palpable muscle contraction  1/5 Palpable muscle contraction, no joint movement  2-/5 Less than full range of motion in gravity eliminated position  2/5 Able to complete full range of motion in gravity eliminated position  2+/5 Able to initiate movement against gravity  3-/5 More than half but not full range of motion against gravity  3/5 Able to complete full range of motion against gravity  3+/5 Completes full range of motion against gravity with minimal resistance  4-/5 Completes full range of motion against gravity with minimal resistance  4/5 Completes full range of motion against gravity with moderate resistance  5/5 Completes full range of motion against gravity with maximum resistance    Sensation: Pt reports new numbness at finger tips   Coordination:  Intact at right; impaired at left for finger to nose and opposition of digits  Tone:         [x]   Right hand dominant   []   Left hand dominant    COGNITION/PERCEPTION Initial Assessment   Overall orientation status     Orientation level Oriented X4   Overall cognitive status     Cognition comment     Vision - Basic  assessment  Perception     Overall sensation status Impaired       EATING Initial Assessment   Functional Level Supervision   Clinical factors       GROOMING Initial Assessment   Functional Level Supervision, Setup (sitting EOB)   Clinical factors        UPPER BODY BATHING Initial Assessment   Functional Level  Supervision, Setup (sitting EOB)   Clinical Factors       LOWER BODY BATHING Initial Assessment   Functional Level  Maximum assistance   Clinical Factors       TUB/SHOWER TRANSFER INDEPENDENCE Initial Assessment   Tub Transfer Score Not tested   Tub Transfer Comments     Shower Transfer Score: Not tested   Shower Transfer Comments       UPPER BODY DRESSING/UNDRESSING Initial Assessment   Functional Level Maximum assistance   Clinical Factors         LOWER BODY DRESSING/UNDRESSING Initial Assessment   Functional Level Maximum assistance   Clinical Factors       TOILETING Initial Assessment   Functional Level Maximum assistance   Clinical factors       TOILET TRANSFER INDEPENDENCE Initial Assessment   Transfer score Moderate assistance   Tub transfer comments  Increased time and management of RW for safe transfer.     WHEELCHAIR/BED TRANSFER INDEPENDENCE Initial Assessment   Transfer Technique       Level of Assistance Moderate assistance   Equipment  Pt required increased time 2/2 to wound pain at sacral area.   Comments       INSTRUMENTAL ADL Initial Assessment (PLOF)   Meal preparation  I         Homemaking  I         Medicine Management  I         Financial Management        Group:  Pt participated in group Connect 4 with and without 2lb weights at wrist to promote strength, FM dexterity/manipulation, and problem solving for ADLs.     ASSESSMENT :  Based on the objective data described above, the patient presents with spinal precaution, decubitus ulcer requiring a wound vac, decreased BUE strength, balance, coordination, endurance, increased pain, and time impacting I with ADLs in a timely manner.     Pt  would benefit from skilled occupational therapy in order to improve independent functional mobility/ADLs,/IADLs within the home. Interventions may include range of motion (AROM, PROM B UE), motor function (B UE/ strengthening/coordination), activity tolerance (vitals, oxygen saturation levels), balance training, ADL/IADL training and functional transfer training.      Please see IRC; Interdisciplinary Eval, Care Plan, and Patient Education for further information regarding occupational therapy examination and plan of care.      PLAN :  Recommendations and Planned Interventions:                 Self Care Training                           Therapeutic Activities                 Functional Mobility Training            Cognitive Retraining                 Therapeutic Exercises              Endurance Activities  [x]                Balance Training                     [x]         Neuromuscular Re-Education  []                Visual/Perceptual Training      [x]    Home Interior and spatial designerafety Training  [x]                Patient Education                    [x]         Family Training/Education  [x]                Other (comment):    Frequency/Duration: Patient will be followed by occupational therapy  1-2 times per day/4-7 days per week to address goals.  Discharge Recommendations:     Further Equipment Recommendations for Discharge:     IRF-PAI Completed: Yes   Entered Differentiated Treatment minutes: N/A     Please refer to the flow sheet for vital signs taken during this treatment.  After treatment:   [x]  Patient left in no apparent distress sitting up in chair (toilet pm session)  []  Patient left in no apparent distress in bed  []  Call bell left within reach  [x]  Nursing notified (pm session)  [x]  Caregiver present (pm session)  []  Bed alarm activated    COMMUNICATION/EDUCATION:   []  Home safety education was provided and the patient/caregiver indicated understanding.  [x]  Patient/family have participated as able in  goal setting and plan of care.  []  Patient/family agree to work toward stated goals and plan of care.  []  Patient understands intent and goals of therapy, but is neutral about his/her participation.  []  Patient is unable to participate in goal setting and plan of care.    Order received from MD for occupational therapy services and chart reviewed.  Pt to be seen 5 times per week for 3 hours of total therapy per day for 2-3  weeks.  Thank you for the referral.    Thank you for this referral.  Alinda DeemRACY E Janete Quilling, OT   Outcome: Progressing

## 2022-01-07 NOTE — ACP (Advance Care Planning) (Signed)
Advance Care Planning     Advance Care Planning Inpatient Note  Spiritual Care Department    Today's Date: 01/07/2022  Unit: [facility] IP REHAB UNIT    Received request from .  Upon review of chart and communication with care team, patient's decision making abilities are not in question.. Patient was/were present in the room during visit.    Goals of ACP Conversation:  Discuss advance care planning documents    Health Care Decision Makers:       Primary Decision Maker: [name] Short, [name] Short Child [phone]    Secondary Decision Maker: [name] Short  Summary:  Verified Documents  Verified Healthcare Decision [facility]    Advance Care Planning Documents (Patient Wishes):  Healthcare Power of Attorney/Advance Directive Appointment of Health Care Agent  Living Will/Advance Directive  Anatomical Gift/Organ Donation     Assessment:  Patient  seen as a new admit to the rehab unit.  There is an advance directive on file . Patient was having breakfast during this visit.    Interventions:      Care Preferences Communicated:   No    Outcomes/Plan:  Existing advance directive reviewed with patient; no changes to patient's previously recorded wishes.    Electronically signed by Charmaine Downs., [facility] on 01/07/2022 at 12:13 PM

## 2022-01-07 NOTE — Plan of Care (Signed)
Problem: Physical Therapy - Adult  Goal: By Discharge: Performs mobility at highest level of function for planned discharge setting.  See evaluation for individualized goals.  Description: Physical Therapy Short Term Goals  Initiated 01/07/2022 and to be accomplished within 7 day(s) (01/14/2022)  1.  Patient will supine to sit, sit to supine, roll right, and roll left  in bed with moderate assistance .    2.  Patient will transfer from bed to chair and chair to bed with minimal assistance/contact guard assist using the least restrictive device.  3.  Patient will perform sit to stand with minimal assistance/contact guard assist  4.  Patient will ambulate with minimal assistance/contact guard assist for 50 feet with the least restrictive device.   5.  Patient will ascend/descend 3 stairs with B handrail(s) with minimal assistance/contact guard assist.    Physical Therapy Long Term Goals  Initiated 01/07/2022 and to be accomplished within 14-21 day(s) (01/28/2022)  1.  Patient will supine to sit, sit to supine, roll right, and roll left in bed with supervision/set-up.    2.  Patient will transfer from bed to chair and chair to bed with supervision/set-up using the least restrictive device.  3.  Patient will perform sit to stand with supervision/set-up.  4.  Patient will ambulate with supervision/set-up for 150 feet with the least restrictive device.   5.  Patient will ascend/descend 4 stairs with 1 handrail(s) with supervision/set-up.      01/07/2022 1010 by Rosario Adie, PT  Outcome: Progressing    PHYSICAL THERAPY EVALUATION    Patient: [name] Short (66 y.o. female)  Date: 01/07/2022  Diagnosis: Acute CVA (cerebrovascular accident) [facility]) [I63.9]   Precautions: Cervical Spinal Precautions, Fall Risk Precautions  Chart, physical therapy assessment, plan of care and goals were reviewed.    Time In: 0930  Time Out: 1100  Minutes: 90  Entered Differentiated Treatment minutes: Yes    Patient seen for: Initial Evaluation, Patient  Education, Teacher, early years/pre, Investment banker, operational, Location manager, Strength Training    Pain:  Pt pain was reported as no c/o pain at rest pre-treatment.  Pt pain was reported as 8/10 pain with mobility post-treatment.  Intervention: Pt reports she received pain medication prior to treatment.  Re-educated pt re: spinal precautions and efficient movement patterns with mobility for increased safety and decreased discomfort.  Assisted pt with repositioning as needed.    Patient identified with name and DOB: Yes    SUBJECTIVE:     Patient stated "I don't feel good."  Pt notes she was nauseous when she took her medications this morning.  Nursing staff notified and aware.  Pt requires encouragement and redirection to task with education re: purpose of participation in skilled intervention as pt takes increased time with all tasks and perseverated on finishing her coffee.    Patient's Goal for Physical Therapy: to return home    OBJECTIVE DATA SUMMARY:     Past Medical History:   Diagnosis Date    Anemia     Arthritis     Chronic pain     Legs and Shoulders    Diabetes (HCC)     Exposure to asbestos     History of blood transfusion     History of colon polyps     HTN (hypertension)     Hx of blood clots     DVT    Hyperlipemia     Menopause     Pulmonary nodule     Serum calcium  elevated     Sleep apnea     not using cpap    Stroke [facility](HCC)      Past Surgical History:   Procedure Laterality Date    CESAREAN SECTION  1987    COLONOSCOPY N/A 01/13/2017    COLONOSCOPY performed by Levan Hursthomas Duntemann, MD at [facility]    COLONOSCOPY      DILATION AND CURETTAGE OF UTERUS  1995    RECTAL SURGERY N/A 12/28/2021    SACRAL WOUND DEBRIDEMENT INCISION AND DRAINAGE performed by Phineas SemenNicole Yeshtokin, DO [facility]    SPINE SURGERY N/A 12/09/2021    CERVICAL THREE/FOUR/FIVE/SIX LAMINECTOMY FUSION; C-ARM; STRYKER; EXT BONE GROWTH STIM; 23 HR performed by Pamalee LeydenMark B Kerner, MD [facility]       Problem List:    Decreased strength B LE  [x]       Decreased strength trunk/core  [x]      Decreased AROM   [x]      Decreased PROM  [x]     Decreased endurance  [x]      Decreased balance sitting  [x]      Decreased balance standing  [x]      Pain   [x]      Slow ambulation velocity  [x]     Decreased coordination  [x]     Decreased safety awareness  [x]       Functional Limitations:   Decreased independence with bed mobility  [x]      Decreased independence with functional transfers  [x]      Decreased independence with ambulation  [x]      Decreased independence with stair negotiation  [x]        Previous Functional Level: Per patient, she had been employed doing Biochemist, clinical"administrative," work at [employer] prior to a gradual decline in function before her cervical spinal surgery.  Pt does not having had a CVA in the past, after which she utilized a cane for ambulation.  Pt notes that her right side was more affected by her first CVA but also indicated she utilized the straight cane in her right hand for ambulation.    Home Environment: House One level Stairs to enter with rails (3 STE with wide set rails + 1 threshold step)         Barriers to Learning/Limitations:  Physical, Cognitive  Compensate with: visual, verbal, tactile, kinesthetic cues/model         Outcome Measures: Mobility Assessment     MMT Initial Assessment   Right Lower Extremity Left Lower Extremity   Hip Flexion 3+ 4-   Knee Extension 4- 4   Knee Flexion 3+ 4   Ankle Dorsiflexion 4- 4   0/5 No palpable muscle contraction  1/5 Palpable muscle contraction, no joint movement  2-/5 Less than full range of motion in gravity eliminated position  2/5 Able to complete full range of motion in gravity eliminated position  2+/5 Able to initiate movement against gravity  3-/5 More than half but not full range of motion against gravity  3/5 Able to complete full range of motion against gravity  3+/5 Completes full range of motion against gravity with minimal resistance  4-/5 Completes full range of motion against gravity with  minimal-moderate resistance  4/5 Completes full range of motion against gravity with moderate resistance  4+/5 Completes full range of motion against gravity with moderate-maximum resistance  5/5 Completes full range of motion against gravity with maximum resistance        GROSS ASSESSMENT Initial Assessment 01/07/2022  AROM  RLE   LLE   Exceptions  Exceptions   Strength  RLE  LLE   Exception  Exception   Coordination  Impaired B UE noted with lower body dressing donning pants   Tone  RLE  LLE   [facility]  WFL   Sensation Impaired   PROM  RLE  LLE   Exceptions  Exceptions       POSTURE Initial Assessment 01/07/2022   Posture (WDL) Poor   Posture Assessment Forward Flexed, Rounded Shoulder Posture with Thoracic Kyphosis.       BALANCE Initial Assessment 01/07/2022   Posture  Sitting - Static  Sitting - Dynamic  Standing - Static  Standing - Dynamic  Comments   Poor  Good  Fair  Poor  Poor       Ability to pick up small object from floor: Unable at this time due to functional weakness       BED/CHAIR/WHEELCHAIR TRANSFERS Initial Assessment 01/07/2022   Rolling Right Maximum assistance   Rolling Left Maximum assistance   Supine to Sit Moderate assistance (Pt required increased encouragement and time to transition to seated position.)   Sit to Stand Moderate Assistance   Sit to Supine Maximum assistance   Transfer Assist Score Moderate assistance   Comments  Pt requires maximal cuing and assistance for utilizing log roll technique with rolling in bed with pt relying on bed rails for support.  Pt maximally cued to maintain spinal precautions and avoid pulling on bed rails.    Pt requires maximal assistance for trunk management and B LE management against gravity with sit to supine and maximal cuing and moderate assistance at pelvis for log roll and trunk to transition to sitting EOB from supine through left side lying.   Car Transfer  NT due to increased time to perform all mobility.  Based on functional transfers from bed and w/c  and assistance for B LE management against gravity, pt would require moderate to maximal assistance with transition from w/c to car transfer trainer.   Car Type N/A       WHEELCHAIR MOBILITY/MANAGEMENT Initial Assessment 01/07/2022   Able to Propel 70 Feet   Assist Level Moderate assistance   Curbs/ramps assistance required  NT   Wheelchair management  Manages B Brakes with maximal assistance.   Comments Pt propelled w/c 50-70 feet at a time with moderate assistance for straight path negotiation.       GAIT Initial Assessment 01/07/2022   Quality of Gait Decreased B foot clearance, decreased left step length with decreased right stance time with step to pattern   Gait Deviations Decreased step height;Decreased step length       WALKING INDEPENDENCE Initial Assessment 01/07/2022   Assistive device Rolling Walker   Ambulation assistance - surface Moderate Assistance  Level tile   Distance 12   Comments  Pt requires minimal to moderate assistance for balance and weight shift with maximal cues for increased left foot clearance and step length and forward gaze as pt demonstrates path deviations to left.       STEPS/STAIRS Initial Assessment 01/07/2022   Steps/Stairs ambulated     Step Height     Rail Use    Assistance Level NT due to decreased safety and foot clearance with ambulation over level surfaces.   Comments     Curbs/Ramps           ASSESSMENT :  Based on the objective data described above, the patient presents with  impaired sensation, impaired balance, impaired strength, impaired coordination, postural dysfunction, and decreased attention to task with decreased activity tolerance s/p cervical spinal surgery and CVA limiting safety and independence with functional mobility..    Patient will benefit from skilled intervention to address the above impairments.  Patient's rehabilitation potential is considered to be Therapy Prognosis: Fair  Factors which may influence rehabilitation potential include:   []          None  noted  [x]          Mental ability/status  [x]          Medical condition  [x]          Home/family situation and support systems  [x]          Safety awareness  [x]          Pain tolerance/management  []          Other:      PLAN :  Please refer to POC for details re: long term goals.    Order received from MD for physical therapy services and chart reviewed.  Pt to be seen 5 times per week for 3 hours of total therapy per day for 2-3 weeks.  Thank you for the referral.    Pt would benefit from skilled physical therapy in order to improve independent functional mobility within the home. Interventions may include range of motion (AROM, PROM B LE/trunk), motor function (B LE/trunk strengthening/coordination), activity tolerance (vitals, oxygen saturation levels), bed mobility training, balance activities, gait training (progressive ambulation program), wheelchair management and functional transfer training. Patient would also benefit from concurrent and group therapy sessions to promote increased participation in skilled therapy interventions and to provide opportunities for increased social interaction.     Discharge Recommendations:  Discharge Recommendations: Home with Home health PT;24 hour supervision or assist;Subacute/Skilled Nursing Facility  Further Equipment Recommendations for Discharge:        Walker: Rolling          IRF-PAI Completed:  Done       Activity Tolerance:   Fair  Please refer to the flowsheet for vital signs taken during this treatment.    After treatment:   []  Patient left in no apparent distress in bed  [x]  Patient left in no apparent distress sitting up in chair under care of occupational therapist.  []  Patient left in no apparent distress in w/c mobilizing under own power  []  Patient left in no apparent distress dining area  []  Patient left in no apparent distress mobilizing under own power  []  Call bell left within reach  []  Nursing notified  []  Caregiver present  []  Bed alarm activated   [x]   Chair alarm activated.    COMMUNICATION/EDUCATION:   [x]          Fall prevention education was provided and the patient/caregiver indicated understanding.  [x]          Patient/family have participated as able in goal setting and plan of care.  [x]          Patient/family agree to work toward stated goals and plan of care.  []          Patient understands intent and goals of therapy, but is neutral about his/her participation.  []          Patient is unable to participate in goal setting and plan of care.    Thank you for this referral.  , PT, DPT  01/07/2022

## 2022-01-08 LAB — BASIC METABOLIC PANEL
Anion Gap: 1 mmol/L — ABNORMAL LOW (ref 3.0–18)
BUN/Creatinine Ratio: 32 — ABNORMAL HIGH (ref 12–20)
BUN: 22 mg/dL — ABNORMAL HIGH (ref 7.0–18)
CO2: 31 mmol/L (ref 21–32)
Calcium: 11 mg/dL — ABNORMAL HIGH (ref 8.5–10.1)
Chloride: 110 mmol/L (ref 100–111)
Creatinine: 0.69 mg/dL (ref 0.6–1.3)
Est, Glom Filt Rate: >60 mL/min/{1.73_m2} (ref >60)
Glucose: 81 mg/dL (ref 74–99)
Potassium: 3.7 mmol/L (ref 3.5–5.5)
Sodium: 142 mmol/L (ref 136–145)

## 2022-01-08 LAB — POCT GLUCOSE
POC Glucose: 96 mg/dL (ref 70–110)
POC Glucose: 105 mg/dL (ref 70–110)
POC Glucose: 145 mg/dL — ABNORMAL HIGH (ref 70–110)
POC Glucose: 172 mg/dL — ABNORMAL HIGH (ref 70–110)

## 2022-01-08 MED FILL — VANCOMYCIN HCL 1 G IV SOLR: 1 g | INTRAVENOUS | Qty: 1000

## 2022-01-08 MED FILL — LANTUS 100 UNIT/ML SC SOLN: 100 UNIT/ML | SUBCUTANEOUS | Qty: 30

## 2022-01-08 MED FILL — PIPERACILLIN SOD-TAZOBACTAM SO 3.375 (3-0.375) G IV SOLR: 3.375 (3-0.375) g | INTRAVENOUS | Qty: 3375

## 2022-01-08 MED FILL — FERROUS SULFATE 325 (65 FE) MG PO TABS: 325 (65 Fe) MG | ORAL | Qty: 1

## 2022-01-08 MED FILL — HYDROCODONE-ACETAMINOPHEN 5-325 MG PO TABS: 5-325 MG | ORAL | Qty: 1

## 2022-01-08 MED FILL — FUROSEMIDE 40 MG PO TABS: 40 MG | ORAL | Qty: 1

## 2022-01-08 MED FILL — LANTUS 100 UNIT/ML SC SOLN: 100 UNIT/ML | SUBCUTANEOUS | Qty: 1

## 2022-01-08 MED FILL — HUMALOG 100 UNIT/ML IJ SOLN: 100 UNIT/ML | INTRAMUSCULAR | Qty: 3

## 2022-01-08 MED FILL — ASPIRIN LOW DOSE 81 MG PO CHEW: 81 MG | ORAL | Qty: 1

## 2022-01-08 MED FILL — HUMALOG 100 UNIT/ML IJ SOLN: 100 UNIT/ML | INTRAMUSCULAR | Qty: 1

## 2022-01-08 MED FILL — ATORVASTATIN CALCIUM 40 MG PO TABS: 40 MG | ORAL | Qty: 2

## 2022-01-08 MED FILL — HUMALOG 100 UNIT/ML IJ SOLN: 100 UNIT/ML | INTRAMUSCULAR | Qty: 4

## 2022-01-08 MED FILL — MAGNESIUM OXIDE -MG SUPPLEMENT 400 (240 MG) MG PO TABS: 400 (240 Mg) MG | ORAL | Qty: 1

## 2022-01-08 MED FILL — HUMALOG 100 UNIT/ML IJ SOLN: 100 UNIT/ML | INTRAMUSCULAR | Qty: 8

## 2022-01-08 MED FILL — LOVENOX 40 MG/0.4ML IJ SOSY: 40 MG/0.4ML | INTRAMUSCULAR | Qty: 0.4

## 2022-01-08 MED FILL — THERAPEUTIC-M PO TABS: ORAL | Qty: 1

## 2022-01-08 NOTE — Plan of Care (Signed)
 Problem: SLP Adult - Disturbed Thought Process  Goal: By Discharge: Demonstrates cognitive skills at highest level of function for planned discharge setting.   See evaluation for individualized goals.  Description: Long term goals (Initiated 01/07/22 to be accomplished by 01/21/2022)  Patient will:  1. Be oriented x 3 and recall events of the day, supervision.  2. Follow 3 part and complex commands, 90% accuracy.  3.  Listen to 3-4 sentence length paragraphs and respond to questions re: content with 90% accuracy.  4.  List 10-12 items in categories of increasing abstraction, supervision.  5.  Discuss similarities/differences given 2 items, 80-90% accuracy.    Short term goals (by 01/14/22)  Patient will:  1.  Be oriented x 3 and recall events of the day, min assist.  2.  Follow 3 part and complex commands, 70-60% accuracy.  3.  Listen to 3-4 sentence length paragraphs and respond to questions re: content with 70-80% accuracy.  4.  List 10-12 items in categories of increasing abstraction, min assist.  5.  Discuss similarities/differences given 2 items, 70-80% accuracy.      Note:   SPEECH-LANGUAGE TREATMENT    Patient: [name] Short (66 y.o. female)  Date: 01/08/2022  Diagnosis: Acute CVA (cerebrovascular accident) [facility]) [I63.9] Acute CVA (cerebrovascular accident) (HCC)      Precautions: Standard  PLOF: As per H&P     ASSESSMENT:  [name] was pleasant and participated in all tasks.  Progression toward goals:  [x]        Improving appropriately and progressing toward goals  []        Improving slowly and progressing toward goals  []        Not making progress toward goals and plan of care will be adjusted     PLAN:  Patient continues to benefit from skilled intervention to address the above impairments.  Continue treatment per established plan of care.     SUBJECTIVE:   Patient stated "I have a grandpuppy, Chloe".    OBJECTIVE:   Mental Status:  [name] was alert and focused during today's session.     Treatment &  Interventions:    Neuro-Linguistics:  Orientation:   Independent  Recent recall:   Independent  3 word delayed recall:  Independent   Answering questions from paragraphs: Basic everyday info 11/12 independent given repetition of 1 paragraph    Listing from categories:   Desserts- 16 independently        Vegetables- 25 given one cue         Kitchen items you plug-in: 11 given minimum assistance (decreased performance due possibly to fatigue or complexity of category)        PAIN:  Start of Tx: no pain reported but presented with visible discomfort such as facial grimaces.  End of Tx: no pain reported but presented with visible discomfort such as facial grimaces.    After treatment:   []        Patient left in no apparent distress sitting up in chair  [x]        Patient left in no apparent distress in bed  [x]        Call bell left within reach  []        Nursing notified  [x]        Caregiver present  []        Bed alarm activated      COMMUNICATION/EDUCATION:   []  Patient educated regarding compensatory speech/language/comprehension techniques provided via demonstration, verbalization and teach back of  comprehension  [x]  Patient/family have participated as able in goal setting and plan of care.  [x]  Patient/family agree to work toward stated goals and plan of care.  []  Patient understands intent and goals of therapy, neutral about participation.  []  Patient unable to participate in goal setting/plan of care secondary to cognition, hearing/vision deficits; education ongoing with interdisciplinary staff       HUNTER BOWEN  Cherre Cornish, CCC-SLP

## 2022-01-08 NOTE — Plan of Care (Signed)
Problem: Occupational Therapy - Adult  Goal: By Discharge: Performs self-care activities at highest level of function for planned discharge setting.  See evaluation for individualized goals.  Description: Occupational Therapy Goals   Long Term Goals  Initiated 01/07/2022 and to be accomplished within 2-3 week(s) 01/28/2022  1. Pt will perform self-feeding with ModI.  2. Pt will perform grooming with S/Setup  3. Pt will perform UB bathing with S/Setup  4. Pt will perform LB bathing with Mina.  5. Pt will perform tub/shower transfer with [facility].  6. Pt will perform UB dressing with S/Setup  7. Pt will perform LB dressing with Mina.  8. Pt will perform toileting task with [facility].  9. Pt will perform toilet transfer with [facility].   10.  Pt will perform an IADL task with good safety and Mina.      Short Term Goals   Initiated 01/07/2022 and to be accomplished within 7 day(s) 01/14/2022  1. Pt will perform self-feeding with Setup.  2. Pt will perform grooming with Setup.  3. Pt will perform UB bathing with Setup.  4. Pt will perform LB bathing with Moda.  5. Pt will perform tub/shower transfer with  Moda.  6. Pt will perform UB dressing with Mina.  7. Pt will perform LB dressing with Moda.  8. Pt will perform toileting task with Moda.  9. Pt will perform toilet transfer with Moda.       Occupational Therapy TREATMENT    Patient: [name] Short   66 y.o.    Patient identified with name and DOB: Yes     Date: 01/08/2022    First Tx Session  Time In:  1100  Time Out:  1130    Second Tx Session  Time In: 1330  Time Out: 1500    Diagnosis: Acute CVA (cerebrovascular accident) [facility]) [I63.9]   Precautions: Restrictions/Precautions: Fall Risk, Surgical Protocols (Cervical Spinal Precautions)  Spinal Precautions: No Bending, No Lifting, No Twisting      Chart, occupational therapy assessment, plan of care, and goals were reviewed.     Pain:  Pt reports 6/10 pain or discomfort prior to treatment.    Pt reports 6/10 pain or discomfort post  treatment.   Intervention Provided: N/A      SUBJECTIVE:   Patient stated "I have to pee."    OBJECTIVE DATA SUMMARY:   Sitting Balance:  Fair   Standing Balance:  Fair-    THERAPEUTIC ACTIVITY Daily Assessment    Pt participated "Connect 4" group in unsupported seating to increase strength, coordination, trunk control for ADLs. Pt able sit unsupported 50% of the time with CGA for proper upright posture.     Pt completed mini peg task to increase strength, coordination, and FM dexterity at left hand.  Pt noted to drop some pegs and use right hand to asst with reorientation of pegs to place in designated space.      THERAPEUTIC EXERCISE Daily Assessment    Pt propelled w/c from room to gym with S.  Pt required vcs to keep upright posture in order to maintain spinal precautions. Increased time and rest breaks needed.  Pt noted to have decreased strength at left UE but able to reorient chair in a straight line.     LOWER BODY DRESSING/UNDRESSING Daily Assessment   Functional Level  Pt participated in changing brief and pants with Mod-Maxa using reacher.  Pt provides with instruction on compensatory strategies (ie dropping  pants down on one side to  thread leg through pants, using reacher to grasp one side of pants to thread )   Clinical Factors       TOILETING    Functional Level Pt performed peri hygiene with setup/SBA   in stand position using RW.    Clinical factors       TOILET TRANSFER  Daily Assessment   Transfer score Pt performed toilet transfer with Mina-Moda requiring increased time to complete 2/2 to pain at buttocks.   Verbal cues for tech required.    Comments       ASSESSMENT:  Pt increasing strength, balance, coordination, activity tolerance for I in ADLs.   Progression toward goals:  [x]           Improving appropriately and progressing toward goals  []           Improving slowly and progressing toward goals  []           Not making progress toward goals and plan of care will be adjusted     PLAN:  Patient  continues to benefit from skilled intervention to address the above impairments.  Continue treatment per established plan of care.  Discharge Recommendations:  Home Health  Further Equipment Recommendations for Discharge:       Activity Tolerance:    Fair     EDUCATION:   Education Given To: Patient;Family  Education Provided: Role of Therapy;Plan of Care;Precautions;Safety;Transfer Training;Mobility Training;Family Education;DME/Home Modifications;Fall Prevention Strategies  Education Method: Demonstration;Verbal  Barriers to Learning: Cognition  Education Outcome: Verbalized understanding;Continued education needed;Demonstrated understanding    COMMUNICATION:   []  Home safety education was provided and the patient/caregiver indicated understanding.  [x]  Patient/family have participated as able in goal setting and plan of care.  []  Patient/family agree to work toward stated goals and plan of care.  []  Patient understands intent and goals of therapy, but is neutral about his/her participation.  []  Patient is unable to participate in goal setting and plan of care.    Please refer to the flowsheet for vital signs taken during this treatment.  After treatment:   []   Patient left in no apparent distress sitting up in chair   [x]   Patient left in no apparent distress in bed  []   Call bell left within reach  []   Nursing notified  []   Caregiver present  []   Bed alarm activated    Estimated LOS: 2-3 weeks  IRF-PAI Completed: N/A  Entered Differentiated Treatment minutes: N/A    , OT   Outcome: Progressing

## 2022-01-08 NOTE — Consults (Signed)
Consult Note      Consult requested by: Jonah Blue, MD    ADMIT DATE: 01/06/2022      CONSULT DATE: January 08, 2022             Admission diagnosis: Acute CVA (cerebrovascular accident) [facility])   Reason for Nephrology Consultation: hypercalcemia     Assessment and plan:  #1 chronic asymptomatic hypercalcemia associated with elevated PTH.  This is primary hyperparathyroidism.  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  She does follow with Dr. Ovidio Hanger.  Calcium today is 11 which corrected would be much higher.  Checking ionized calcium and repeat PTH.  #2 infected necrotic sacral ulcer  #3 diabetes mellitus type 2  #4 hypertension  #5 cervical spondylosis with myelopathy    Plan:  #1 check ionized calcium PTH and vitamin D levels  #2 maintain good hydration  #3 monitor renal functions and electrolytes  #4 check urine protein creatinine ratio, SPEP and serum free light chains    Discussed plan with the patient and primary team    Please call with questions,    Robyne Askew, MD FASN  Cell [phone]  Pager: [phone]  Fax   [phone]        HPI: [name] Short is a 66 y.o. female Black / African American with past medical history of diabetes mellitus type 2, hypertension, history of DVT, prior CVA who presented to the emergency room with failure to thrive.  Patient was noted to have infected necrotic sacral decubitus ulcer.  ID was consulted and patient was started on antibiotics.  Surgery also followed the patient.  When stabilized patient was transferred to the rehab.  Patient was noted to have hypercalcemia which has been chronic.  She follows with Dr. Ovidio Hanger in [place] endocrine for the same.  She has been suspected of having a small parathyroid adenoma posterior to the right thyroid gland and this could be contributing to her hypercalcemia.  Her PTH was elevated in the past at 120.7.  Indicating primary para hyperparathyroidism       Past Medical History:   Diagnosis Date     Anemia     Arthritis     Chronic pain     Legs and Shoulders    Diabetes (HCC)     Exposure to asbestos     History of blood transfusion     History of colon polyps     HTN (hypertension)     Hx of blood clots     DVT    Hyperlipemia     Menopause     Pulmonary nodule     Serum calcium elevated     Sleep apnea     not using cpap    Stroke [facility])       Past Surgical History:   Procedure Laterality Date    CESAREAN SECTION  1987    COLONOSCOPY N/A 01/13/2017    COLONOSCOPY performed by Levan Hurst, MD at [facility]    COLONOSCOPY      DILATION AND CURETTAGE OF UTERUS  1995    RECTAL SURGERY N/A 12/28/2021    SACRAL WOUND DEBRIDEMENT INCISION AND DRAINAGE performed by Phineas Semen, DO at [facility] MAIN OR    SPINE SURGERY N/A 12/09/2021    CERVICAL THREE/FOUR/FIVE/SIX LAMINECTOMY FUSION; C-ARM; STRYKER; EXT BONE GROWTH STIM; 23 HR performed by Pamalee Leyden, MD at [facility] MAIN OR       Social History  Socioeconomic History    Marital status: Single     Spouse name: Not on file    Number of children: Not on file    Years of education: Not on file    Highest education level: Not on file   Occupational History    Not on file   Tobacco Use    Smoking status: Every Day     Packs/day: 1.00     Types: Cigarettes, Cigars     Last attempt to quit: 08/2019     Years since quitting: 2.4    Smokeless tobacco: Never   Vaping Use    Vaping Use: Never used   Substance and Sexual Activity    Alcohol use: Yes     Comment: occ    Drug use: Never    Sexual activity: Not on file   Other Topics Concern    Not on file   Social History Narrative    Not on file     Social Determinants of Health     Financial Resource Strain: Low Risk     Difficulty of Paying Living Expenses: Not hard at all   Food Insecurity: No Food Insecurity    Worried About Programme researcher, broadcasting/film/video in the Last Year: Never true    Ran Out of Food in the Last Year: Never true   Transportation Needs: No Transportation Needs    Lack of Transportation (Medical): No    Lack of  Transportation (Non-Medical): No   Physical Activity: Inactive    Days of Exercise per Week: 0 days    Minutes of Exercise per Session: 0 min   Stress: No Stress Concern Present    Feeling of Stress : Not at all   Social Connections: Moderately Isolated    Frequency of Communication with Friends and Family: Three times a week    Frequency of Social Gatherings with Friends and Family: More than three times a week    Attends Religious Services: More than 4 times per year    Active Member of Clubs or Organizations: No    Attends Banker Meetings: Never    Marital Status: Widowed   Catering manager Violence: Not At Risk    Fear of Current or Ex-Partner: No    Emotionally Abused: No    Physically Abused: No    Sexually Abused: No   Housing Stability: Low Risk     Unable to Pay for Housing in the Last Year: No    Number of Places Lived in the Last Year: 1    Unstable Housing in the Last Year: No       Family History   Problem Relation Age of Onset    Diabetes Other 45        parent, NOS    Cancer Father     Hypertension Other 35        parent,NOS    Heart Disease Other 62        parent, NOS    Lung Disease Father      Allergies   Allergen Reactions    Lactose Other (See Comments)     Intolerance-Pt denies        Home Medications:   @    Current Inpatient Medications:     Current Facility-Administered Medications   Medication Dose Route Frequency    insulin glargine (LANTUS) injection vial 30 Units  30 Units SubCUTAneous Daily    ondansetron (ZOFRAN-ODT) disintegrating tablet 4 mg  4  mg SubLINGual Q8H PRN    insulin lispro (HUMALOG) injection vial 3 Units  3 Units SubCUTAneous TID WC    aspirin chewable tablet 81 mg  81 mg Oral Daily    atorvastatin (LIPITOR) tablet 80 mg  80 mg Oral Nightly    enoxaparin (LOVENOX) injection 40 mg  40 mg SubCUTAneous Daily    ferrous sulfate (IRON 325) tablet 325 mg  325 mg Oral Daily    furosemide (LASIX) tablet 40 mg  40 mg Oral Daily     HYDROcodone-acetaminophen (NORCO) 5-325 MG per tablet 1 tablet  1 tablet Oral Q4H PRN    magnesium oxide (MAG-OX) tablet 400 mg  400 mg Oral Daily    therapeutic multivitamin-minerals 1 tablet  1 tablet Oral Daily    glucose chewable tablet 16 g  4 tablet Oral PRN    dextrose bolus 10% 125 mL  125 mL IntraVENous PRN    Or    dextrose bolus 10% 250 mL  250 mL IntraVENous PRN    glucagon (rDNA) injection 1 mg  1 mg SubCUTAneous PRN    dextrose 10 % infusion   IntraVENous Continuous PRN    acetaminophen (TYLENOL) tablet 650 mg  650 mg Oral Q4H PRN    polyethylene glycol (GLYCOLAX) packet 17 g  17 g Oral Daily PRN    insulin lispro (HUMALOG) injection vial 0-8 Units  0-8 Units SubCUTAneous TID WC    insulin lispro (HUMALOG) injection vial 0-4 Units  0-4 Units SubCUTAneous Nightly    vancomycin (VANCOCIN) 1,000 mg in sodium chloride 0.9 % 250 mL (vial-mate) IVPB  1,000 mg IntraVENous Q12H    piperacillin-tazobactam (ZOSYN) 3,375 mg in sodium chloride 0.9 % 50 mL IVPB (mini-bag)  3,375 mg IntraVENous Q8H       Review of Systems:   No fever or chills. No sore throat. No cough or hemoptysis. No shortness of breath or chest pain. No orthopnea or paroxysmal nocturnal dyspnea. Good appetite. No nausea, vomiting, abdominal pain, melena or hematochezia. No constipation or diarrhea. No dysuria, no gross hematuria of voiding difficulties. No ankle swelling, no joint paints. No muscle aches. No skin changes. No dizziness or lightheadedness. No headaches.       Physical Assessment:     Vitals:    01/07/22 1012 01/07/22 1615 01/07/22 2130 01/08/22 0815   BP:  128/65 130/74 136/67   Pulse:  80 89 89   Resp:  14 18 14    Temp:  97.8 F (36.6 C) 99.1 F (37.3 C) 97 F (36.1 C)   TempSrc:  Oral Oral Oral   SpO2:  100%  95%   Height: 4\' 11"  (1.499 m)        @WEIGHTLAST3IP @  Admission weight:      No intake or output data in the 24 hours ending 01/08/22 1410    Patient is in no apparent distress.   HEENT: mmm  Lungs: good air entry,  clear to auscultation bilaterally.    Cardiovascular system: S1, S2, regular rate and rhythm.  Abdomen: soft, non tender, non distended. Positive bowel sounds  Extremities: no clubbing, cyanosis or edema.   Integumentary: skin is grossly intact.   Neurologic: Alert, oriented time three.    Data Review:    Labs: Results:       Chemistry Recent Labs     01/07/22  0530 01/08/22  0530   NA 144 142   K 3.0* 3.7   CL 110 110   CO2 31 31  BUN 20* 22*         CBC w/Diff Recent Labs     01/07/22  0530   WBC 6.8   RBC 3.39*   HGB 8.3*   HCT 26.9*   PLT 287         Iron/Ferritin No results for input(s): IRON in the last 72 hours.    Invalid input(s): TIBCCALC   PTH/VIT D No results for input(s): PTH in the last 72 hours.    Invalid input(s): VITD           Georgiann CockerPrasad B Mcclain Shall, MD  01/08/2022  2:10 PM      January 08, 2022

## 2022-01-08 NOTE — Plan of Care (Signed)
Problem: Chronic Conditions and Co-morbidities  Goal: Patient's chronic conditions and co-morbidity symptoms are monitored and maintained or improved  Outcome: Progressing     Problem: Safety - Adult  Goal: Free from fall injury  Outcome: Progressing     Problem: Skin/Tissue Integrity  Goal: Absence of new skin breakdown  Description: 1.  Monitor for areas of redness and/or skin breakdown  2.  Assess vascular access sites hourly  3.  Every 4-6 hours minimum:  Change oxygen saturation probe site  4.  Every 4-6 hours:  If on nasal continuous positive airway pressure, respiratory therapy assess nares and determine need for appliance change or resting period.  Outcome: Progressing     Problem: ABCDS Injury Assessment  Goal: Absence of physical injury  Outcome: Progressing     Problem: Pain  Goal: Verbalizes/displays adequate comfort level or baseline comfort level  Outcome: Progressing

## 2022-01-08 NOTE — Plan of Care (Signed)
Physical Therapy Short Term Goals  Initiated 01/07/2022 and to be accomplished within 7 day(s) (01/14/2022)  1.  Patient will supine to sit, sit to supine, roll right, and roll left  in bed with moderate assistance .    2.  Patient will transfer from bed to chair and chair to bed with minimal assistance/contact guard assist using the least restrictive device.  3.  Patient will perform sit to stand with minimal assistance/contact guard assist  4.  Patient will ambulate with minimal assistance/contact guard assist for 50 feet with the least restrictive device.   5.  Patient will ascend/descend 3 stairs with B handrail(s) with minimal assistance/contact guard assist.    Physical Therapy Long Term Goals  Initiated 01/07/2022 and to be accomplished within 14-21 day(s) (01/28/2022)  1.  Patient will supine to sit, sit to supine, roll right, and roll left in bed with supervision/set-up.    2.  Patient will transfer from bed to chair and chair to bed with supervision/set-up using the least restrictive device.  3.  Patient will perform sit to stand with supervision/set-up.  4.  Patient will ambulate with supervision/set-up for 150 feet with the least restrictive device.   5.  Patient will ascend/descend 4 stairs with 1 handrail(s) with supervision/set-up      PHYSICAL THERAPY TREATMENT    Patient: [name] Short ([age] y.o. female)  Date: 01/08/2022  Diagnosis: Acute CVA (cerebrovascular accident) [facility](HCC) [I63.9]   Precautions: Cervical Spinal Precautions, Fall Risk Precautions  Chart, physical therapy assessment, plan of care and goals were reviewed.    Time in: 0825  Time out : 0900    Patient seen for: Patient Education, Bed Mobility, Teacher, early years/preTransfer Training, Wheelchair Mobility Training  Pt missed treatment time at 0800 secondary to nursing care being assisted with dressing and nurse initiating wound vac dressing change.      Time in: 0930  Time out : 1000    Patient seen for: Patient Education, Teacher, early years/preTransfer Training, Investment banker, operationalGait Training, Balance  Training    Attempted to recover time at 1345 - pt with OT after change in schedule.  Will re-attempt as able.    Time in: 1310  Time out : 1345    Patient seen for: Patient Education, Teacher, early years/preTransfer Training, NMRE, Balance Training    Pain:  Pt pain was reported as 7/10 pre-treatment.  Pt pain was reported as 8/10 post-treatment.  Intervention: Pt reports pain at site of sacral wound.  Assisted with repositioning and nursing staff notified and aware.    Patient identified with name and DOB: Yes    SUBJECTIVE:      Pt is pleasant and cooperative throughout treatment session but requires increased time and encouragement for attention to task.    OBJECTIVE DATA SUMMARY:    Objective:   Education: Education Given To: Patient;Family  Education Provided: Role of Therapy;Plan of Care;Precautions;Safety;Transfer Training;Mobility Training;Family Education;DME/Home Modifications;Fall Prevention Strategies  Education Method: Demonstration;Verbal  Barriers to Learning: Cognition  Education Outcome: Verbalized understanding;Continued education needed;Demonstrated understanding  BED/MAT MOBILITY Daily Assessment    Rolling Right Minimal assistance;Moderate assistance with increased time and minimal to moderate verbal cuing for log roll.  Pt requires assistance at pelvis and trunk with cues to avoid pulling on bed trail.    Supine to Sit Minimal assistance;Moderate assistance for log roll from supine to left side lying and minimal to moderate assistance for trunk management from side lying to sit.  Pt is able to advance LEs over EOB to dependent position with verbal  cues.      TRANSFERS Daily Assessment    Sit to Stand Maximum Assistance for sit to stand initially from bed and minimal assistance for stand to sit to w/c.  After initial transfer, pt requires minimal to moderate assistance for lift off with sit to stand with pt citing pain as limiting factor utilizing  B UE to assist with transition sit to stand to RW.  Pt requires  moderate to maximal verbal cuing and demonstration for safe and efficient movement pattern with increased hip flexion for slow controlled descent with stand to sit to avoid arching her back with reaching back with B UEs for arm rests of w/c.    Transfer Assist Score Maximum assistance (minimal assistance once standing for balance and safe RW management with stand step transfer with RW)    At end of second treatment session, pt offered assistance back to bed for repositioning.  However, pt reports toileting need first.  PT assisted pt to bedside commode with call bell in reach and direct hand-off to pt's nurse.  Pt performed stand step transfer with RW with minimal to moderate assistance for balance and weight shifting with maximal verbal cues for safe RW management in narrow space and increased foot clearance with stepping.        GAIT Daily Assessment    Gait Deviations Decreased step height;Decreased step length    Assistive device Rolling Walker    Ambulation assistance - surface  Minimal assistance;Moderate assistance  Level tile    Distance 10 Feet x 1 trial    Comments Pt ambulated in room with RW requiring minimal assistance for balance and weight shift and moderate assistance for safe RW management with verbal cues for positioning and advancement.  Pt demonstrates decreased right weight shift and stance time ambulating with step to gait pattern.        BALANCE Daily Assessment    Posture Poor    Sitting - Static Good    Sitting - Dynamic Fair  During second treatment session, pt participated in unsupported sitting at edge of w/c seat with mirror utilized for visual feedback to participate in NMRE and postural re-education.  Pt demonstrates posterior pelvic tilt with posterior lean and poor ability to maintain B feet flat on floor initially during activity requiring maximal verbal and visual demonstration and maximal manual cues for abdominal and lumbar and thoracic extensor engagement with manual cues for  anterior weight shift to maintain COM over BOS with B hands on distal third of B femurs.  Pt is able to achieve COM over BOS.    Standing - Static Poor    Standing - Dynamic Poor      WHEELCHAIR MOBILITY/MANAGEMENT Daily Assessment   Able to Propel  Distance not challenged - pt challenged to participate in w/c mobility within room to reposition in preparation to eat breakfast OOB in w/c.  Pt propelled w/c approximately 10 feet negotiating a 180 degree turn.   Assist Level  Minimal assistance   Wheelchair management Manages B brakes with moderate assistance and left brake extender.   Comments Pt propels w/c in room in narrow spaces with minimal assistance for obstacle negotiation and moderate to maximal verbal cuing for problem solving propulsion with B UE.       ASSESSMENT:  Pt is progressing requiring decreased assistance with bed mobility this treatment session.  However, pt continues to demonstrate functional weakness and decreased safety awareness and will benefit from ongoing skilled PT intervention to promote increased  safety and independence upon d/c.  Progression toward goals:  []       Improving appropriately and progressing toward goals  [x]       Improving slowly and progressing toward goals  []       Not making progress toward goals and plan of care will be adjusted      PLAN:  Patient continues to benefit from skilled intervention to address the above impairments.  Continue treatment per established plan of care.  Emphasize functional strengthening and repetition of safe and efficient movement patterns to promote increased safety and independence upon d/c home.  Discharge Recommendations:  Home with Home health PT;24 hour supervision or assist;Subacute/Skilled Nursing Facility  Further Equipment Recommendations for Discharge:        Rolling (small adult rolling walker)             Estimated Discharge Date:2 weeks    Activity Tolerance:   Fair  Please refer to the flowsheet for vital signs taken during this  treatment.    After treatment:   []  Patient left in no apparent distress in bed  [x]  Patient left in no apparent distress sitting up in chair  []  Patient left in no apparent distress in w/c mobilizing under own power  []  Patient left in no apparent distress dining area  []  Patient left in no apparent distress mobilizing under own power  [x]  Call bell left within reach  []  Nursing notified  []  Caregiver present  []  Bed alarm activated   [x]  Chair alarm activated      , PT, DPT  01/08/2022

## 2022-01-08 NOTE — Progress Notes (Signed)
[facility]  [address] [place], [place] [address]     INPATIENT REHABILITATION  DAILY PROGRESS NOTE     Date: 01/08/2022    Name: [name] Short Age / Sex: 66 y.o. / female   CSN: [id number] MRN: [id number]   Admit Date: 01/06/2022 Length of Stay: 2 days     Primary Rehabilitation Diagnosis impaired mobility and ADLs in the setting of 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         Subjective:     I personally saw and evaluated this patient face-to-face today.  Patient is laying in bed in no apparent distress, awake and alert.    Objective:     Vital Signs:  Patient Vitals for the past 24 hrs:   BP Temp Temp src Pulse Resp SpO2   01/08/22 0815 136/67 97 F (36.1 C) Oral 89 14 95 %   01/07/22 2130 130/74 99.1 F (37.3 C) Oral 89 18 --        Physical Examination:  General:  Awake, alert  Cardiovascular:  S1S2+, RRR  Pulmonary:  CTA b/l  GI:  Soft, BS+, NT, ND  Extremities:  No edema   Has a sacral decubitus ulcer  Has a left arm PICC line in place    Current Medications:  Current Facility-Administered Medications   Medication Dose Route Frequency    insulin glargine (LANTUS) injection vial 30 Units  30 Units SubCUTAneous Daily    ondansetron (ZOFRAN-ODT) disintegrating tablet 4 mg  4 mg SubLINGual Q8H PRN    insulin lispro (HUMALOG) injection vial 3 Units  3 Units SubCUTAneous TID WC    aspirin chewable tablet 81 mg  81 mg Oral Daily    atorvastatin (LIPITOR) tablet 80 mg  80 mg Oral Nightly    enoxaparin (LOVENOX) injection 40 mg  40 mg SubCUTAneous Daily    ferrous sulfate (IRON 325) tablet 325 mg  325 mg Oral Daily    furosemide (LASIX) tablet 40 mg  40 mg Oral Daily    HYDROcodone-acetaminophen (NORCO) 5-325 MG per tablet 1 tablet  1 tablet Oral Q4H PRN    magnesium oxide (MAG-OX) tablet 400 mg  400 mg Oral Daily    therapeutic multivitamin-minerals 1 tablet  1 tablet Oral Daily    glucose chewable tablet 16 g  4 tablet Oral PRN    dextrose bolus 10% 125 mL  125 mL  IntraVENous PRN    Or    dextrose bolus 10% 250 mL  250 mL IntraVENous PRN    glucagon (rDNA) injection 1 mg  1 mg SubCUTAneous PRN    dextrose 10 % infusion   IntraVENous Continuous PRN    acetaminophen (TYLENOL) tablet 650 mg  650 mg Oral Q4H PRN    polyethylene glycol (GLYCOLAX) packet 17 g  17 g Oral Daily PRN    insulin lispro (HUMALOG) injection vial 0-8 Units  0-8 Units SubCUTAneous TID WC    insulin lispro (HUMALOG) injection vial 0-4 Units  0-4 Units SubCUTAneous Nightly    vancomycin (VANCOCIN) 1,000 mg in sodium chloride 0.9 % 250 mL (vial-mate) IVPB  1,000 mg IntraVENous Q12H    piperacillin-tazobactam (ZOSYN) 3,375 mg in sodium chloride 0.9 % 50 mL IVPB (mini-bag)  3,375 mg IntraVENous Q8H       Allergies:  Allergies   Allergen Reactions    Lactose Other (See Comments)     Intolerance-Pt denies       Lab/Data Review:  Recent Results (  from the past 24 hour(s))   POCT Glucose    Collection Time: 01/07/22  4:47 PM   Result Value Ref Range    POC Glucose 152 (H) 70 - 110 mg/dL   POCT Glucose    Collection Time: 01/07/22  9:19 PM   Result Value Ref Range    POC Glucose 96 70 - 110 mg/dL   Basic Metabolic Panel    Collection Time: 01/08/22  5:30 AM   Result Value Ref Range    Sodium 142 136 - 145 mmol/L    Potassium 3.7 3.5 - 5.5 mmol/L    Chloride 110 100 - 111 mmol/L    CO2 31 21 - 32 mmol/L    Anion Gap 1 (L) 3.0 - 18 mmol/L    Glucose 81 74 - 99 mg/dL    BUN 22 (H) 7.0 - 18 MG/DL    Creatinine 4.090.69 0.6 - 1.3 MG/DL    Bun/Cre Ratio 32 (H) 12 - 20      Est, Glom Filt Rate >60 >60 ml/min/1.3273m2    Calcium 11.0 (H) 8.5 - 10.1 MG/DL   POCT Glucose    Collection Time: 01/08/22  8:32 AM   Result Value Ref Range    POC Glucose 105 70 - 110 mg/dL   POCT Glucose    Collection Time: 01/08/22 12:16 PM   Result Value Ref Range    POC Glucose 145 (H) 70 - 110 mg/dL   POCT Glucose    Collection Time: 01/08/22  4:33 PM   Result Value Ref Range    POC Glucose 172 (H) 70 - 110 mg/dL         Assessment:     Primary  Rehabilitation Diagnosis impaired mobility and ADLs in the setting of 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     Other Co-Morbid Conditions managed in Rehab   Cervical spondylosis with myelopathy, status post laminectomy and fusion of C3-C6 and external bone growth stimulator placement on Dec 09, 2021  Infected sacral decubitus ulcer-status post debridement  Type 2 diabetes with hyperglycemia  Hypertension  Hypomagnesemia  Anemia    PLAN  -impaired mobility and ADLs in the setting of 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   Continue aspirin and high intensity statin.  Continue PT OT and speech therapy     -Cervical spondylosis with myelopathy, status post laminectomy and fusion of C3-C6 and external bone growth stimulator placement on Dec 09, 2021-PT and OT.  Judicious pain control.  Bowel regimen        -Infected sacral decubitus ulcer-status post debridement.  Continue wound care.  Continue IV antibiotics.  Routine PICC line care        -Type 2 diabetes with hyperglycemia.  Continue Lantus insulin and prandial insulin and sliding scale insulin.  Monitor sugars        -Hypertension-well controlled off medications        -Hypomagnesemia-continue to supplement p.o. magnesium    -Hypokalemia  On January 07, 2022 potassium was 3.0.  Repleted  On January 08, 2022 potassium is 3.7  Monitor intermittently     -Anemia-continue ferrous sulfate, monitor  On January 07, 2022 hemoglobin is 8.3    -Hypercalcemia, primary hyperparathyroidism  On January 07, 2022 calcium was 10.6  On January 08, 2022 calcium is 6011  Nephrology is following.  Discussed with patient.  Discussed with nephrologist     Fall precautions, aspiration precautions  I discussed with patient  Functional Progress:    PHYSICAL THERAPY    ON ADMISSION MOST RECENT   Balance  Balance  Posture: Poor  Sitting - Static: Good  Sitting - Dynamic: Fair  Standing - Static: Poor  Standing - Dynamic: Poor  Poor  Good  Fair  Poor  Poor      Balance  Balance  Posture:  Poor  Sitting - Static: Good  Sitting - Dynamic: Fair  Standing - Static: Poor  Standing - Dynamic: Poor  Posture: Poor  Sitting - Static: Good  Sitting - Dynamic: Fair  Standing - Static: Poor  Standing - Dynamic: Poor        Bed Mobility  Maximum assistance  Maximum assistance  Moderate assistance (Pt requird increased encouragement and time to transition to seated position.)  Moderate Assistance  Maximum assistance  Moderate assistance    Bed Mobility  Rolling to Right: Minimal assistance, Moderate assistance  Rolling to Left: Maximum assistance  Supine to Sit: Minimal assistance, Moderate assistance  Sit to Stand: Maximum Assistance  Sit to Supine: Maximum assistance  Bed to Chair: Maximum assistance (minimal assistance once standing for balance and safe RW management with stand step transfer with RW)      Wheelchair Mobility  Yes       Wheelchair Mobility  Propulsion: Yes         Ambulation  Rolling Walker  Moderate assistance  Level tile  12 Ambulation  Device: Agricultural consultant  Assistance: Minimal assistance, Moderate assistance  Surface: Level tile  Distance: 10 Feet x 1 trial   Stairs  No       Stairs  Stairs?: No           Functional Progress:    OCCUPATIONAL THERAPY    ON ADMISSION MOST RECENT   Eating  Supervision Eating  Feeding: Supervision   Grooming  Supervision, Setup (sitting EOB) Grooming  Grooming: Supervision, Setup (sitting EOB)   Bathing  UB Bathing Supervision, Setup (sitting EOB)  LB Bathing Maximum assistance Bathing  UB Bathing   UE Bathing: Supervision, Setup (sitting EOB)  LB Bathing   FVCB([id number])@   Upper Body Dressing  Maximum assistance   Upper Body Dressing  UE Dressing: Maximum assistance   Lower Body Dressing  Maximum assistance Lower Body Dressing  LE Dressing: Maximum assistance   Toileting  Maximum assistance Toileting  Toileting: Maximum assistance   Toilet Transfers  Moderate assistance Toilet Transfers  Toilet Transfer: Moderate assistance   Tub Transfers  Tub  Transfers: Not tested  [employer] Transfers: Not tested Tub Transfers  Tub Transfers  Tub Transfers: Not tested  SHOWER Transfers  Tub Transfers  Tub Transfers: Not tested     Legend:   7 - Independent   6 - Modified Independent   5 - Standby Assistance / Supervision / Set-up   4 - Minimum Assistance / Contact Guard Assistance   3 - Moderate Assistance   2 - Maximum Assistance   1 - Total Assistance / Dependent             Plan:     1. Justification for continued stay: Fair progression towards established rehabilitation goals.    2. Medical Issues being followed closely:    [x]   Fall and safety precautions     [x]   Wound Care     [x]   Bowel and Bladder Function     [x]   Fluid Electrolyte and Nutrition Balance     [x]   Pain  Control      3. Issues that 24 hour rehabilitation nursing is following:      Fall and safety precautions       Wound Care       Bowel and Bladder Function       Fluid Electrolyte and Nutrition Balance       Pain Control        Assistance with and education on in-room safety with transfers to and from the bed, wheelchair, toilet and shower.      4. Acute rehabilitation plan of care:      Continue current care and rehab.             Physical Therapy             Occupational Therapy             Speech Therapy       Hold Rehab until further notice     5. Medications:      MAR Reviewed       Continue Present Medications       6. Chemical DVT Prophylaxis:        Enoxaparin       Unfractionated Heparin       Warfarin       NOAC       Aspirin       None     7. Mechanical DVT Prophylaxis:        TED Stockings       Sequential Compression Device       None     8. GI Prophylaxis:        PPI       H2 Blocker       None / Not indicated     9. Code status:Full    Dragon medical dictation software was used for portions of this report. Unintended errors may occur.      Signed:    Jonah Blue, MD      January 08, 2022

## 2022-01-08 NOTE — Consults (Signed)
Room 179: pt's family member states cervical spine dressing has been in place for over 2 weeks.  Discussed with primary nurse Barbaraann Rondo.   Pt's honeycomb dressing was removed at this time, incision is well approximated & can be left open to air now.  Will turn over care to nursing staff at this time.  Flora Parks Mel Almond BSN, RN, [employer], [place]

## 2022-01-08 NOTE — Progress Notes (Signed)
Pt is a 66 year old female admitted to ARU. Pt is alert and oriented with her daughter in the room. Pt consents to conversation with her daughter present. Pt states that she lives with her daughter in a 1 level home with 4 steps to enter with a tub shower.     Pt states that prior to recent medical events she was working fulltime and able to self care. Pt states that with decline in function prior to admissions she was using a rolling walker and cane for mobility. Pt and daughter state the pt has used [facility] and [facility] in the past. Pt states that she has no history with SNF.     Pt states that she sees  Dr Raechel Chute as her PCP and fills her medications at [facility] on [address]. Pt states that she has received her COVID vaccine/boosters and is interested in an additional booster. Sw encouraged pt to discuss with the MD.     Pt states that her daughter, [name] Short), is her POA and NOK contact. Pt's daughter states that she is able to offer support at dc but asked questions regarding additional supports. Sw reviewed pt's insurance coverage and work benefits. Per pt and daughter pt has excess income per medicaid criteria. Sw offered option of dc to Skilled Nursing and both pt and daughter declined. Sw reviewed caregiver education.     Pt affirmed her insurance as [employer] through her employer. Sw reviewed insurance updates.     Pt and daughter deny additional questions.     Sw will follow.

## 2022-01-08 NOTE — Plan of Care (Signed)
Physical Therapy Attempt Note      Attempted treatment at 0800.  Pt completing bed bath with student nurses and pt's nurse reports she will be reapplying pt's wound vac dressing.  Pt missed scheduled treatment time due to nursing care needs.  Will re-attempt as able.    Rivka Barbara PT, DPT

## 2022-01-09 ENCOUNTER — Ambulatory Visit (INDEPENDENT_AMBULATORY_CARE_PROVIDER_SITE_OTHER): Payer: Medicare Other | Admitting: Podiatry

## 2022-01-09 DIAGNOSIS — L819 Disorder of pigmentation, unspecified: Secondary | ICD-10-CM | POA: Diagnosis not present

## 2022-01-09 LAB — POCT GLUCOSE
POC Glucose: 221 mg/dL — ABNORMAL HIGH (ref 70–110)
POC Glucose: 128 mg/dL — ABNORMAL HIGH (ref 70–110)
POC Glucose: 207 mg/dL — ABNORMAL HIGH (ref 70–110)
POC Glucose: 259 mg/dL — ABNORMAL HIGH (ref 70–110)

## 2022-01-09 LAB — PROTEIN, URINE, RANDOM: Protein, Urine, Random: 14 mg/dL — ABNORMAL HIGH (ref <11.9)

## 2022-01-09 LAB — VITAMIN D 25 HYDROXY: Vit D, 25-Hydroxy: 27 ng/mL — ABNORMAL LOW (ref 30–100)

## 2022-01-09 LAB — CREATININE, RANDOM URINE: Creatinine, Ur: 36 mg/dL (ref 30–125)

## 2022-01-09 LAB — TSH: TSH, 3rd Generation: 1.37 u[IU]/mL (ref 0.36–3.74)

## 2022-01-09 LAB — BASIC METABOLIC PANEL
Anion Gap: 2 mmol/L — ABNORMAL LOW (ref 3.0–18)
BUN/Creatinine Ratio: 30 — ABNORMAL HIGH (ref 12–20)
BUN: 24 mg/dL — ABNORMAL HIGH (ref 7.0–18)
CO2: 31 mmol/L (ref 21–32)
Calcium: 11.1 mg/dL — ABNORMAL HIGH (ref 8.5–10.1)
Chloride: 110 mmol/L (ref 100–111)
Creatinine: 0.79 mg/dL (ref 0.6–1.3)
Est, Glom Filt Rate: >60 mL/min/{1.73_m2} (ref >60)
Glucose: 124 mg/dL — ABNORMAL HIGH (ref 74–99)
Potassium: 3.6 mmol/L (ref 3.5–5.5)
Sodium: 143 mmol/L (ref 136–145)

## 2022-01-09 LAB — PTH, INTACT
Calcium: 10.9 mg/dL — ABNORMAL HIGH (ref 8.5–10.1)
Pth Intact: 135.7 pg/mL — ABNORMAL HIGH (ref 18.4–88.0)

## 2022-01-09 MED ORDER — INSULIN GLARGINE 100 UNIT/ML SC SOLN
100 UNIT/ML | Freq: Every day | SUBCUTANEOUS | Status: AC
Start: 2022-01-09 — End: 2022-01-25
  Administered 2022-01-10 – 2022-01-24 (×14): 32 [IU] via SUBCUTANEOUS

## 2022-01-09 MED ORDER — FERROUS SULFATE 325 (65 FE) MG PO TBEC
325 (65 Fe) MG | Freq: Every day | ORAL | Status: AC
Start: 2022-01-09 — End: 2022-01-25
  Administered 2022-01-09 – 2022-01-24 (×16): 325 mg via ORAL

## 2022-01-09 MED FILL — VANCOMYCIN HCL 1 G IV SOLR: 1 g | INTRAVENOUS | Qty: 1000

## 2022-01-09 MED FILL — ATORVASTATIN CALCIUM 40 MG PO TABS: 40 MG | ORAL | Qty: 2

## 2022-01-09 MED FILL — HYDROCODONE-ACETAMINOPHEN 5-325 MG PO TABS: 5-325 MG | ORAL | Qty: 1

## 2022-01-09 MED FILL — PIPERACILLIN SOD-TAZOBACTAM SO 3.375 (3-0.375) G IV SOLR: 3.375 (3-0.375) g | INTRAVENOUS | Qty: 3375

## 2022-01-09 MED FILL — HUMALOG 100 UNIT/ML IJ SOLN: 100 UNIT/ML | INTRAMUSCULAR | Qty: 3

## 2022-01-09 MED FILL — MAGNESIUM OXIDE -MG SUPPLEMENT 400 (240 MG) MG PO TABS: 400 (240 Mg) MG | ORAL | Qty: 1

## 2022-01-09 MED FILL — ASPIRIN LOW DOSE 81 MG PO CHEW: 81 MG | ORAL | Qty: 1

## 2022-01-09 MED FILL — LOVENOX 40 MG/0.4ML IJ SOSY: 40 MG/0.4ML | INTRAMUSCULAR | Qty: 0.4

## 2022-01-09 MED FILL — FUROSEMIDE 40 MG PO TABS: 40 MG | ORAL | Qty: 1

## 2022-01-09 MED FILL — LANTUS 100 UNIT/ML SC SOLN: 100 UNIT/ML | SUBCUTANEOUS | Qty: 30

## 2022-01-09 MED FILL — THERAPEUTIC-M PO TABS: ORAL | Qty: 1

## 2022-01-09 MED FILL — FERROUS SULFATE 325 (65 FE) MG PO TBEC: 325 (65 Fe) MG | ORAL | Qty: 1

## 2022-01-09 NOTE — Plan of Care (Signed)
 Problem: SLP Adult - Disturbed Thought Process  Goal: By Discharge: Demonstrates cognitive skills at highest level of function for planned discharge setting.   See evaluation for individualized goals.  Description: Long term goals (Initiated 01/07/22 to be accomplished by 01/21/2022)  Patient will:  1. Be oriented x 3 and recall events of the day, supervision.  2. Follow 3 part and complex commands, 90% accuracy.  3.  Listen to 3-4 sentence length paragraphs and respond to questions re: content with 90% accuracy.  4.  List 10-12 items in categories of increasing abstraction, supervision.  5.  Discuss similarities/differences given 2 items, 80-90% accuracy.    Short term goals (by 01/14/22)  Patient will:  1.  Be oriented x 3 and recall events of the day, min assist.  2.  Follow 3 part and complex commands, 70-60% accuracy.  3.  Listen to 3-4 sentence length paragraphs and respond to questions re: content with 70-80% accuracy.  4.  List 10-12 items in categories of increasing abstraction, min assist.  5.  Discuss similarities/differences given 2 items, 70-80% accuracy.      01/09/2022 1333 by Lindi Adie  Note:   SPEECH-LANGUAGE TREATMENT    Patient: [name] Short (66 y.o. female)  Date: 01/09/2022  Diagnosis: Acute CVA (cerebrovascular accident) [facility]) [I63.9] Acute CVA (cerebrovascular accident) (HCC)      Precautions: Standard  PLOF: As per H&P     ASSESSMENT:  [name] Short was alert and participated in all tasks.   Progression toward goals:  [x]        Improving appropriately and progressing toward goals  []        Improving slowly and progressing toward goals  []        Not making progress toward goals and plan of care will be adjusted     PLAN:  Patient continues to benefit from skilled intervention to address the above impairments.  Continue treatment per established plan of care.     SUBJECTIVE:   Patient stated "I point because it's right there".    OBJECTIVE:   Mental Status:  [name] Short was positive and focused during  today's session.   Treatment & Interventions:    Neuro-Linguistics:   Orientation:   Independent  Recent recall:   Independent  Sequencing 4 items:  Household 5/5 Independently      Social: 4/4 supervised  Concrete divergent naming:  Listing items in recipe; listed 8 supervised      PAIN:  Start of Tx: no pain reported but apparent discomfort (facial expressions)  End of Tx: no pain reported but apparent discomfort (facial expressions)    After treatment:   [x]        Patient left in no apparent distress sitting up in chair  []        Patient left in no apparent distress in bed  [x]        Call bell left within reach  []        Nursing notified  []        Caregiver present  []        Bed alarm activated      COMMUNICATION/EDUCATION:   [x]  Patient educated regarding compensatory speech/language/comprehension techniques provided via demonstration, verbalization and teach back of comprehension  [x]  Patient/family have participated as able in goal setting and plan of care.  [x]  Patient/family agree to work toward stated goals and plan of care.  []  Patient understands intent and goals of therapy, neutral about participation.  []  Patient unable to participate in  goal setting/plan of care secondary to cognition, hearing/vision deficits; education ongoing with interdisciplinary staff       HUNTER BOWEN     01/09/2022 1027 by Elisha Headland, SLP  Note:   SPEECH-LANGUAGE TREATMENT    Patient: [name] Short (66 y.o. female)  Date: 01/09/2022  Diagnosis: Acute CVA (cerebrovascular accident) [facility]) [I63.9] Acute CVA (cerebrovascular accident) (HCC)      Precautions: Standard  PLOF: As per H&P     ASSESSMENT:  Patient was alert and oriented this morning.  Progression toward goals:  [x]        Improving appropriately and progressing toward goals  []        Improving slowly and progressing toward goals  []        Not making progress toward goals and plan of care will be adjusted     PLAN:  Patient continues to benefit from skilled intervention to  address the above impairments.  Continue treatment per established plan of care.     SUBJECTIVE:   Patient stated "I have a grand-puppy, Chloe".    OBJECTIVE:   Mental Status:  [name] Short was alert and engaged in this morning's session.  Treatment & Interventions:  Neuro-Linguistics:  Orientation:  Supervision  Recent memory: Supervision  Recall 3 words: Independent  Recall of paragraphs: Recalled 11/12 facts     Repetition of 1 paragraph needed  Divergent naming:    Vegetables: Patient named 25   Desserts: 16 foods named  ` Kitchen items: 11 provided with min cues    Response & Tolerance to Activities:  [name] Short was engaged and motivated throughout the session.    PAIN:  Start of Tx: No pain reported but visible signs of discomfort (facial grimacing)  End of Tx: No pain reported but visible signs of discomfort (facial grimacing)     After treatment:   [x]        Patient left in no apparent distress sitting up in chair  []        Patient left in no apparent distress in bed  [x]        Call bell left within reach  []        Nursing notified  [x]        Caregiver present  []        Bed alarm activated      COMMUNICATION/EDUCATION:   []  Patient educated regarding compensatory speech/language/comprehension techniques provided via demonstration, verbalization and teach back of comprehension  [x]  Patient/family have participated as able in goal setting and plan of care.  [x]  Patient/family agree to work toward stated goals and plan of care.  []  Patient understands intent and goals of therapy, neutral about participation.  []  Patient unable to participate in goal setting/plan of care secondary to cognition, hearing/vision deficits; education ongoing with interdisciplinary staff       Ernst Breach, SLP

## 2022-01-09 NOTE — Plan of Care (Signed)
[facility]  [address] [place], [place] [address]     INPATIENT REHABILITATION  OVERALL PLAN OF CARE    Name: [name] Short CSN: [id number]   Age: 66 y.o.  MRN: [id number]   Sex: female Admit Date: 01/06/2022     Primary Rehabilitation Diagnosis impaired mobility and ADLs in the setting of 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     Other Co-Morbid Conditions managed in Rehab   Cervical spondylosis with myelopathy, status post laminectomy and fusion of C3-C6 and external bone growth stimulator placement on Dec 09, 2021  Infected sacral decubitus ulcer-status post debridement  Type 2 diabetes with hyperglycemia  Hypertension  Hypomagnesemia  Anemia     PLAN  -impaired mobility and ADLs in the setting of 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   Continue aspirin and high intensity statin.  PT OT speech therapy     -Cervical spondylosis with myelopathy, status post laminectomy and fusion of C3-C6 and external bone growth stimulator placement on Dec 09, 2021-PT and OT.  Judicious pain control.  Bowel regimen        -Infected sacral decubitus ulcer-status post debridement.  Wound care IV antibiotics, routine PICC line care     -Type 2 diabetes with hyperglycemia.  Continue Lantus insulin and prandial insulin and sliding scale insulin.  Monitor sugars  On January 09, 2022 I increased Lantus from 30 units to 32 units daily, monitor     -Hypertension-well controlled off medications        -Hypomagnesemia-continue to supplement p.o. magnesium     -Hypokalemia  On January 07, 2022 potassium was 3.0.  Repleted  On January 08, 2022 potassium is 3.7  On January 09, 2022 potassium was 3.6     -Anemia-continue ferrous sulfate, monitor  On January 07, 2022 hemoglobin is 8.3     -Hypercalcemia, primary hyperparathyroidism  On January 07, 2022 calcium was 10.6  On January 08, 2022 calcium is 11  On January 09, 2022 calcium is 10.9  Nephrology is following     Fall precautions, aspiration precautions  I discussed with  patient.      ANTICIPATED INTERVENTIONS THAT SUPPORT THE MEDICAL NECESSITY OF THIS ADMISSION:    I. Physical Therapy              A. Intensity: 1-2 hour per day              B. Frequency: 5 times per week              C. Duration: 2 weeks              D. Short Term Goals:    Initiated 01/07/2022 and to be accomplished within 7 day(s) (01/14/2022)  1.  Patient will supine to sit, sit to supine, roll right, and roll left  in bed with moderate assistance .    2.  Patient will transfer from bed to chair and chair to bed with minimal assistance/contact guard assist using the least restrictive device.  3.  Patient will perform sit to stand with minimal assistance/contact guard assist  4.  Patient will ambulate with minimal assistance/contact guard assist for 50 feet with the least restrictive device.   5.  Patient will ascend/descend 3 stairs with B handrail(s) with minimal assistance/contact guard assist.              E. Long Term Goals:    Initiated 01/07/2022 and to be accomplished within 14-21  day(s) (01/28/2022)  1.  Patient will supine to sit, sit to supine, roll right, and roll left in bed with supervision/set-up.    2.  Patient will transfer from bed to chair and chair to bed with supervision/set-up using the least restrictive device.  3.  Patient will perform sit to stand with supervision/set-up.  4.  Patient will ambulate with supervision/set-up for 150 feet with the least restrictive device.   5.  Patient will ascend/descend 4 stairs with 1 handrail(s) with supervision/set-up   F. Interventions:   Pt would benefit from skilled physical therapy in order to improve independent functional mobility within the home. Interventions may include range of motion (AROM, PROM B LE/trunk), motor function (B LE/trunk strengthening/coordination), activity tolerance (vitals, oxygen saturation levels), bed mobility training, balance activities, gait training (progressive ambulation program), wheelchair management and functional  transfer training. Patient would also benefit from concurrent and group therapy sessions to promote increased participation in skilled therapy interventions and to provide opportunities for increased social interaction.          II. Occupational Therapy              A. Intensity: 1-2 hour per day              B. Frequency: 5 times per week              C. Duration: 2 weeks              D. Short Term Goals:    Initiated 01/07/2022 and to be accomplished within 7 day(s) 01/14/2022  1. Pt will perform self-feeding with Setup.  2. Pt will perform grooming with Setup.  3. Pt will perform UB bathing with Setup.  4. Pt will perform LB bathing with Moda.  5. Pt will perform tub/shower transfer with  Moda.  6. Pt will perform UB dressing with Mina.  7. Pt will perform LB dressing with Moda.  8. Pt will perform toileting task with Moda.  9. Pt will perform toilet transfer with Moda.                     E. Long Term Goals:     Initiated 01/07/2022 and to be accomplished within 2-3 week(s) 01/28/2022  1. Pt will perform self-feeding with ModI.  2. Pt will perform grooming with S/Setup  3. Pt will perform UB bathing with S/Setup  4. Pt will perform LB bathing with Mina.  5. Pt will perform tub/shower transfer with [facility].  6. Pt will perform UB dressing with S/Setup  7. Pt will perform LB dressing with Mina.  8. Pt will perform toileting task with [facility].  9. Pt will perform toilet transfer with [facility].   10.  Pt will perform an IADL task with good safety and Mina.    F. Interventions:   Pt would benefit from skilled occupational therapy in order to improve independent functional mobility/ADLs,/IADLs within the home. Interventions may include range of motion (AROM, PROM B UE), motor function (B UE/ strengthening/coordination), activity tolerance (vitals, oxygen saturation levels), balance training, ADL/IADL training and functional transfer training.      III. Speech Therapy              A. Intensity: 1-2 times per day              B.  Frequency: 5 times per week              C. Duration: 2 weeks  D. Short Term Goals:    Short term goals (by 01/14/22)  Patient will:  1.  Be oriented x 3 and recall events of the day, min assist.  2.  Follow 3 part and complex commands, 70-60% accuracy.  3.  Listen to 3-4 sentence length paragraphs and respond to questions re: content with 70-80% accuracy.  4.  List 10-12 items in categories of increasing abstraction, min assist.  5.  Discuss similarities/differences given 2 items, 70-80% accuracy.              E. Long Term Goals:    Long term goals (Initiated 01/07/22 to be accomplished by 01/21/2022)  Patient will:  1. Be oriented x 3 and recall events of the day, supervision.  2. Follow 3 part and complex commands, 90% accuracy.  3.  Listen to 3-4 sentence length paragraphs and respond to questions re: content with 90% accuracy.  4.  List 10-12 items in categories of increasing abstraction, supervision.  5.  Discuss similarities/differences given 2 items, 80-90% accuracy.    F. Interventions:   Patient continues to benefit from skilled intervention to address the above impairments.  Continue speech therapy per established plan of care.      PHYSICIAN'S ASSESSMENT OF FINDINGS:    Are the established goals sufficient for achieving the optimal level of function?    [x]   Yes      []   No    What changes would you recommend to the goals as written? None      Are the interventions noted sufficient for achieving the optimal level of function?    [x]   Yes      []   No    What changes would you recommend to the interventions noted? If therapy staff is unable to provide 3 hr of total therapy per day in 5 days due to medical issues or decreased patient tolerance, may modify treatment schedule to 15 hr/week.      Estimated length of stay: 14 days    Willingness to participate in the program: Good      Medical rehabilitation prognosis:    []   Excellent     [x]   Good     []   Fair     []   Guarded       Discharge  Destination:     [x]   Home     []   Assisted Living Facility     []   Skilled Nursing Facility     []   [facility]     []   Long Term Care Facility     []   Other:       Signed:    Jonah BlueVikas Hajar Penninger, MD      January 09, 2022

## 2022-01-09 NOTE — Plan of Care (Signed)
Problem: Chronic Conditions and Co-morbidities  Goal: Patient's chronic conditions and co-morbidity symptoms are monitored and maintained or improved  Outcome: Progressing     Problem: Safety - Adult  Goal: Free from fall injury  Outcome: Progressing     Problem: Skin/Tissue Integrity  Goal: Absence of new skin breakdown  Description: 1.  Monitor for areas of redness and/or skin breakdown  2.  Assess vascular access sites hourly  3.  Every 4-6 hours minimum:  Change oxygen saturation probe site  4.  Every 4-6 hours:  If on nasal continuous positive airway pressure, respiratory therapy assess nares and determine need for appliance change or resting period.  Outcome: Progressing     Problem: ABCDS Injury Assessment  Goal: Absence of physical injury  Outcome: Progressing

## 2022-01-09 NOTE — Plan of Care (Addendum)
Late entry for 01/08/22    Problem: SLP Adult - Disturbed Thought Process  Goal: By Discharge: Demonstrates cognitive skills at highest level of function for planned discharge setting.   See evaluation for individualized goals.  Description: Long term goals (Initiated 01/07/22 to be accomplished by 01/21/2022)  Patient will:  1. Be oriented x 3 and recall events of the day, supervision.  2. Follow 3 part and complex commands, 90% accuracy.  3.  Listen to 3-4 sentence length paragraphs and respond to questions re: content with 90% accuracy.  4.  List 10-12 items in categories of increasing abstraction, supervision.  5.  Discuss similarities/differences given 2 items, 80-90% accuracy.    Short term goals (by 01/14/22)  Patient will:  1.  Be oriented x 3 and recall events of the day, min assist.  2.  Follow 3 part and complex commands, 70-60% accuracy.  3.  Listen to 3-4 sentence length paragraphs and respond to questions re: content with 70-80% accuracy.  4.  List 10-12 items in categories of increasing abstraction, min assist.  5.  Discuss similarities/differences given 2 items, 70-80% accuracy.      Note:   SPEECH-LANGUAGE TREATMENT    Patient: [name] Short (66 y.o. female)  Date: 01/09/2022  Diagnosis: Acute CVA (cerebrovascular accident) [facility]) [I63.9] Acute CVA (cerebrovascular accident) (HCC)      Precautions: Standard  PLOF: As per H&P     ASSESSMENT:  Patient was alert and oriented this morning.  Progression toward goals:  [x]        Improving appropriately and progressing toward goals  []        Improving slowly and progressing toward goals  []        Not making progress toward goals and plan of care will be adjusted     PLAN:  Patient continues to benefit from skilled intervention to address the above impairments.  Continue treatment per established plan of care.     SUBJECTIVE:   Patient stated "I have a grand-puppy, Chloe".    OBJECTIVE:   Mental Status:  [name] Short was alert and engaged in this morning's  session.  Treatment & Interventions:  Neuro-Linguistics:  Orientation:  Supervision  Recent memory: Supervision  Recall 3 words: Independent  Recall of paragraphs: Recalled 11/12 facts     Repetition of 1 paragraph needed  Divergent naming:    Vegetables: Patient named 25   Desserts: 16 foods named  ` Kitchen items: 11 provided with min cues    Response & Tolerance to Activities:  [name] Short was engaged and motivated throughout the session.    PAIN:  Start of Tx: No pain reported but visible signs of discomfort (facial grimacing)  End of Tx: No pain reported but visible signs of discomfort (facial grimacing)     After treatment:   [x]        Patient left in no apparent distress sitting up in chair  []        Patient left in no apparent distress in bed  [x]        Call bell left within reach  []        Nursing notified  [x]        Caregiver present  []        Bed alarm activated      COMMUNICATION/EDUCATION:   []  Patient educated regarding compensatory speech/language/comprehension techniques provided via demonstration, verbalization and teach back of comprehension  [x]  Patient/family have participated as able in goal setting and plan of care.  [x]   Patient/family agree to work toward stated goals and plan of care.  []  Patient understands intent and goals of therapy, neutral about participation.  []  Patient unable to participate in goal setting/plan of care secondary to cognition, hearing/vision deficits; education ongoing with interdisciplinary staff       Carita Pian, SLP

## 2022-01-09 NOTE — Plan of Care (Signed)
Problem: Occupational Therapy - Adult  Goal: By Discharge: Performs self-care activities at highest level of function for planned discharge setting.  See evaluation for individualized goals.  Description: Occupational Therapy Goals   Long Term Goals  Initiated 01/07/2022 and to be accomplished within 2-3 week(s) 01/28/2022  1. Pt will perform self-feeding with ModI.  2. Pt will perform grooming with S/Setup  3. Pt will perform UB bathing with S/Setup  4. Pt will perform LB bathing with Mina.  5. Pt will perform tub/shower transfer with [facility].  6. Pt will perform UB dressing with S/Setup  7. Pt will perform LB dressing with Mina.  8. Pt will perform toileting task with [facility].  9. Pt will perform toilet transfer with [facility].   10.  Pt will perform an IADL task with good safety and Mina.      Short Term Goals   Initiated 01/07/2022 and to be accomplished within 7 day(s) 01/14/2022  1. Pt will perform self-feeding with Setup.  2. Pt will perform grooming with Setup.  3. Pt will perform UB bathing with Setup.  4. Pt will perform LB bathing with Moda.  5. Pt will perform tub/shower transfer with  Moda.  6. Pt will perform UB dressing with Mina.  7. Pt will perform LB dressing with Moda.  8. Pt will perform toileting task with Moda.  9. Pt will perform toilet transfer with Moda.         Outcome: Progressing   Occupational Therapy TREATMENT    Patient: [name] Short   66 y.o.    Patient identified with name and DOB: Yes     Date: 01/09/2022    First Tx Session  Time In:  1100  Time Out:  1130    Second Tx Session  Time In: 1330  Time Out: 1400    Third Tx Session  Time In: 1400  Time Out: 1430    Diagnosis: Acute CVA (cerebrovascular accident) [facility]) [I63.9]   Precautions: Restrictions/Precautions: Fall Risk, Surgical Protocols (Cervical Spinal Precautions)  Spinal Precautions: No Bending, No Lifting, No Twisting      Chart, occupational therapy assessment, plan of care, and goals were reviewed.     Pain:  Pt reports 6-7/10 pain or  discomfort prior to treatment.    Pt reports 6-7/10 pain or discomfort post treatment.   Intervention Provided: N/A      SUBJECTIVE:   Patient stated "My but hurts."    OBJECTIVE DATA SUMMARY:     THERAPEUTIC ACTIVITY Daily Assessment    Pt participated in "Uno" group to promote problem solving and strength for ADLs.  2lb weight applied for second round.    Pt participated in reaching for stickies x27 placed in midline on floor mirror to promote standing balance and tolerance for ADLs.  Pt able to stand for 3:27 seconds.      THERAPEUTIC EXERCISE Daily Assessment    Instruct pt. in home exercise program: UB HEP (bicep curls, chess press, chest pulls,) with 2lb weight sitting in w/c.    Pt propelled w/c from room to gym with pillow placed at back to maximize hand placement on w/c wheel.  Pt required S and vcs to keep spinal neck precautions and increased time to complete task.  Pt noted to tilt head to left.     Theraflexbar (red) supination/pronation and wrist extension/flexion 2x10.  Pt instructed to keep elbows at side to maximize forearm strengthening.  Pt given mirror as a visual to promote upright posture  2/2 to spinal precautions.       ASSESSMENT:  Pt increasing strength, balance, and coordination for ADLs.  Progression toward goals:  [x]           Improving appropriately and progressing toward goals  []           Improving slowly and progressing toward goals  []           Not making progress toward goals and plan of care will be adjusted     PLAN:  Patient continues to benefit from skilled intervention to address the above impairments.  Continue treatment per established plan of care.  Discharge Recommendations:  Home Health  Further Equipment Recommendations for Discharge:  N/A      Activity Tolerance:    Fair    EDUCATION:   Education Given To: Patient  Education Provided: Role of Therapy;Plan of Care;Precautions;Safety;Transfer Training;Mobility Training;Family Education;DME/Home Modifications;Fall  Prevention Strategies  Education Method: Demonstration;Verbal  Barriers to Learning: Cognition  Education Outcome: Verbalized understanding;Continued education needed;Demonstrated understanding    COMMUNICATION:   []  Home safety education was provided and the patient/caregiver indicated understanding.  [x]  Patient/family have participated as able in goal setting and plan of care.  []  Patient/family agree to work toward stated goals and plan of care.  []  Patient understands intent and goals of therapy, but is neutral about his/her participation.  []  Patient is unable to participate in goal setting and plan of care.    Please refer to the flowsheet for vital signs taken during this treatment.  After treatment:   []   Patient left in no apparent distress sitting up in chair   [x]   Patient left in no apparent distress in bed  []   Call bell left within reach  []   Nursing notified  []   Caregiver present  []   Bed alarm activated    Estimated LOS:2-3 weeks  IRF-PAI Completed: N/A  Entered Differentiated Treatment minutes:     , OT

## 2022-01-09 NOTE — Plan of Care (Deleted)
Problem: SLP Adult - Disturbed Thought Process  Goal: By Discharge: Demonstrates cognitive skills at highest level of function for planned discharge setting.   See evaluation for individualized goals.  Description: Long term goals (Initiated 01/07/22 to be accomplished by 01/21/2022)  Patient will:  1. Be oriented x 3 and recall events of the day, supervision.  2. Follow 3 part and complex commands, 90% accuracy.  3.  Listen to 3-4 sentence length paragraphs and respond to questions re: content with 90% accuracy.  4.  List 10-12 items in categories of increasing abstraction, supervision.  5.  Discuss similarities/differences given 2 items, 80-90% accuracy.    Short term goals (by 01/14/22)  Patient will:  1.  Be oriented x 3 and recall events of the day, min assist.  2.  Follow 3 part and complex commands, 70-60% accuracy.  3.  Listen to 3-4 sentence length paragraphs and respond to questions re: content with 70-80% accuracy.  4.  List 10-12 items in categories of increasing abstraction, min assist.  5.  Discuss similarities/differences given 2 items, 70-80% accuracy.      01/09/2022 1333 by Lindi Adie  Note:   SPEECH-LANGUAGE TREATMENT    Patient: [name] Short (66 y.o. female)  Date: 01/09/2022  Diagnosis: Acute CVA (cerebrovascular accident) [facility]) [I63.9] Acute CVA (cerebrovascular accident) (HCC)      Precautions: Standard  PLOF: As per H&P     ASSESSMENT:  Ms. Santarelli was alert and participated in all tasks.   Progression toward goals:  [x]        Improving appropriately and progressing toward goals  []        Improving slowly and progressing toward goals  []        Not making progress toward goals and plan of care will be adjusted     PLAN:  Patient continues to benefit from skilled intervention to address the above impairments.  Continue treatment per established plan of care.     SUBJECTIVE:   Patient stated "I point because it's right there".    OBJECTIVE:   Mental Status:  Ms. Sol was positive and focused during  today's session.   Treatment & Interventions:    Neuro-Linguistics:   Orientation:   Independent  Recent recall:   Independent  Sequencing 4 items:  Household 5/5 Independently      Social: 4/4 supervised  Concrete divergent naming:  Listing items in recipe; listed 8 supervised      PAIN:  Start of Tx: no pain reported but apparent discomfort (facial expressions)  End of Tx: no pain reported but apparent discomfort (facial expressions)    After treatment:   [x]        Patient left in no apparent distress sitting up in chair  []        Patient left in no apparent distress in bed  [x]        Call bell left within reach  []        Nursing notified  []        Caregiver present  []        Bed alarm activated      COMMUNICATION/EDUCATION:   [x]  Patient educated regarding compensatory speech/language/comprehension techniques provided via demonstration, verbalization and teach back of comprehension  [x]  Patient/family have participated as able in goal setting and plan of care.  [x]  Patient/family agree to work toward stated goals and plan of care.  []  Patient understands intent and goals of therapy, neutral about participation.  []  Patient unable to participate in  goal setting/plan of care secondary to cognition, hearing/vision deficits; education ongoing with interdisciplinary staff       HUNTER BOWEN     01/09/2022 1027 by Sherol Sabas C Tammey Deeg, SLP  Note:   SPEECH-LANGUAGE TREATMENT    Patient: [name] Short (66 y.o. female)  Date: 01/09/2022  Diagnosis: Acute CVA (cerebrovascular accident) (HCC) [I63.9] Acute CVA (cerebrovascular accident) (HCC)      Precautions: Standard  PLOF: As per H&P     ASSESSMENT:  Patient was alert and oriented this morning.  Progression toward goals:  [x]       Improving appropriately and progressing toward goals  []       Improving slowly and progressing toward goals  []       Not making progress toward goals and plan of care will be adjusted     PLAN:  Patient continues to benefit from skilled intervention to  address the above impairments.  Continue treatment per established plan of care.     SUBJECTIVE:   Patient stated "I have a grand-puppy, Chloe".    OBJECTIVE:   Mental Status:  Mrs. Waldschmidt was alert and engaged in this morning's session.  Treatment & Interventions:  Neuro-Linguistics:  Orientation:  Supervision  Recent memory: Supervision  Recall 3 words: Independent  Recall of paragraphs: Recalled 11/12 facts     Repetition of 1 paragraph needed  Divergent naming:    Vegetables: Patient named 25   Desserts: 16 foods named  ` Kitchen items: 11 provided with min cues    Response & Tolerance to Activities:  Mrs. Lapoint was engaged and motivated throughout the session.    PAIN:  Start of Tx: No pain reported but visible signs of discomfort (facial grimacing)  End of Tx: No pain reported but visible signs of discomfort (facial grimacing)     After treatment:   [x]       Patient left in no apparent distress sitting up in chair  []       Patient left in no apparent distress in bed  [x]       Call bell left within reach  []       Nursing notified  [x]       Caregiver present  []       Bed alarm activated      COMMUNICATION/EDUCATION:   [] Patient educated regarding compensatory speech/language/comprehension techniques provided via demonstration, verbalization and teach back of comprehension  [x] Patient/family have participated as able in goal setting and plan of care.  [x] Patient/family agree to work toward stated goals and plan of care.  [] Patient understands intent and goals of therapy, neutral about participation.  [] Patient unable to participate in goal setting/plan of care secondary to cognition, hearing/vision deficits; education ongoing with interdisciplinary staff       Alanya Vukelich, SLP

## 2022-01-09 NOTE — Progress Notes (Signed)
Pharmacy Pharmacokinetic Monitoring Service - Vancomycin    Consulting Provider: Jonah Blue, MD   Indication:  infected sacral decub.   Target Concentration: Goal AUC/MIC 400-600 mg*hr/L  Day of Therapy (Rehab): 4 of 31 (through 02/06/22 as per ID recommendations).  Additional Antimicrobials: Piperacillin/Tazobactam    Pertinent Laboratory Values:   Temp: 99.1 F (37.3 C)  Recent Labs     01/07/22  0530 01/08/22  0530   BUN 20* 22*   WBC 6.8  --      Estimated Creatinine Clearance: 76 mL/min (based on SCr of 0.69 mg/dL).    Pertinent Cultures:  Culture Date Source Results   05/27 Blood x 2 NG   05/27 Wound, Sacral Proteus mirabilis (sens to Zosyn)  Enterococcus faecalis (sens to Vanc)     MRSA Nasal Swab: N/A. Non-respiratory infection    Assessment:  Date/Time Current Dose Concentration Timing of Concentration (h) AUC   6/05 1000 mg q12h 17.4 9.2h 415   6/06 1000 mg q12h - - -   06/07 " "      06/08 " "      Note: Serum concentrations collected for AUC dosing may appear elevated if collected in close proximity to the dose administered, this is not necessarily an indication of toxicity    Plan:  Current dosing regimen is therapeutic  Continue current dose of 1000 mg IV q12h  Anticipated AUC of 556 and trough concentration of 17.1 at steady state  Renal labs as indicated   Vancomycin level ordered for 06/09 with AM labs  Pharmacy will continue to monitor patient and adjust therapy as indicated

## 2022-01-09 NOTE — Progress Notes (Signed)
[facility]  Team Conference  Date: 01/09/2022  ACTIVE MONITORING OF CO-MORBID CONDITIONS/MANAGEMENT OF NEW MEDICAL ISSUES: Acute CVA (cerebrovascular accident) ([place]) [I63.9]    **Please refer to patient's electronic medical record to view the care plan and goals**  NURSING Making gains Yes   Skin Care: Skin Integumentary   Skin Color: Pink  Skin Condition/Temp: Dry, Warm  Skin Integrity: Incision (see LDA)  Location: sacrum  Wound Prevention/Protection Method: Yes  Location of Wound Prevention: Sacrum, Heel  Orientation of Wound Prevention: Posterior, Mid  Wound Offloading (Prevention Methods): Bed, pressure redistribution/air, Turning, Repositioning, Pillows  Dressing Present : Yes  Skin Assessed Underneath Dressing This Shift: Yes  Skin Fold Management: Yes    Wound Care:           Pain: Pain Assessment  Pain Assessment: 0-10 (01/09/22 1300)  Pain Level: 5 (01/09/22 1300)  Patient's Stated Pain Goal: 0 - No pain (01/09/22 1300)  Pain Location: Buttocks;Sacrum (01/09/22 1234)  Pain Orientation: Mid (01/09/22 1234)  Pain Descriptors: Throbbing (01/09/22 1234)  Functional Pain Assessment: Activities are not prevented (01/09/22 1234)  Non-Pharmaceutical Pain Intervention(s): Repositioned (01/09/22 1234)  Response to Pain Intervention: Pain improved but above pain goal (01/09/22 1300)  Side Effects: No reported side effects (01/09/22 1300)    Bladder/Bowel Urine Assessment  Urinary Status: Voiding  Urinary Incontinence: Present  Urine Color: Yellow/straw  Urine Appearance: Clear  Urine Odor: No odor Stool Assessment  Incontinence: Yes  Last BM (including prior to admit): 01/08/22   [x]   Continent bladder        [x]  Incontinent bladder           [] Bladder training      [x]   Continent bowel        []  Incontinent bowel              [] Bowel training    Education:   [x]  Lovenox/Coumadin education    [x]  Diabetic Teaching   []  Tube Feeding Education      [x]  Wound Care education           [x]   Other: medication, pressure injury, pain, fall risk   []  Family Teaching completed:  No   []  Lab value concerns   No       Occupational Therapy  Making gains  Yes   Treatment Barriers:  [x]  Fatigue                   [x]  Pain                  []  Participation      []  Cognition             []  Incontinence           []  Precautions      []  Communication/Language   [x]  Safety awareness  [x]  Medical Status       []  Visual/Perceptual deficits     []  Other:    Treatment Areas of Focus/Interventions: [] Coordination     [x] Strength   [x] ADL Training     [x] Transfer Training        [] Cognitive Training      [] Perceptual Training        [] Home Management Training   [] Improved UE Function    [] Functional Mobility Training    [] Toileting schedule      [] OOB schedule            [] Modalities (E-stim, etc.):   [x]   other: safety awareness   []  Family Teaching completed No      Accessibility Limitations:3 steps to enter          Physical Therapy Making gains Yes   Treatment Barriers:  [x]  Fatigue                   [x]  Pain                  []  Participation      [x]  Incontinence          [x]  Precautions      []  Communication/Language  [] Mental Status   [x]  Safety awareness  []  Medical Status       [x]  Other: balance   Treatment Areas of Focus/Interventions: [x] Balance     [x] Strength   [x] Coordination     [x] Transfer Training        [x] Mobility     [x] Sitting Balance  [x] Gait Training  [x] Improve safety awareness     [x] W/C Mobility         [] Modalities (E-stim, Lite gait etc.):   []  Family Teaching completed No      Accessibility Limitations:3 steps to enter and threshold      Speech Therapy Making gains Yes   Treatment Barriers:  [x]  Fatigue                   [x]  Pain                  []  Participation      [] Impulsivity               []  Aphasia                  [] Attention           [x] Cognition   []  Dysphagia              [x]  Other: language   Treatment Areas of Focus/Interventions: [] Attention   [] Orientation   [] Executive function    [] Problem solving        [x] Language      [] Speech  [] Memory  [] Swallowing   [] Safety/Awareness     [x]  Other:cognition   []  Family Teaching completed No     Therapy Minutes Density Met: Yes    Therapy Minutes Not met Action/Justification:   []  Pt on medical hold  []  Pt refusing therapy despite encouragement and education on benefits of therapy  []  Pt displays decreased tolerance to therapy  []  Other     CMG Date: tbd  Estimated Discharge Date: 01/24/2022  [x] Rehabilitation goals from IPOC/Treatment plan reviewed  [x] No changes identified  [] Goal(s) changed:     Plan for upcoming treatment period/intervention for current barriers:  [x]  Pt will increase activity tolerance for daily tasks.  [x]  Pt will improve bed mobility with reduced assist.  [x]  Pt will improve safety in fx tasks with reduced cues/assist  [x]  Pt will improve transfers with reduced assist  [x]  Pt will improve toileting with reduced assist  [x]  Pt will improve ADL's with use of adaptive equipment with reduced assist  [x]  Pt will improve pain mgmt for maximum participation in tx program  [x]  Pt will improve communication to get basic needs met on unit  []  Pt will improve swallowing for safe diet advancement with use of strategies  []   Plan for discharge to home  Patient needs identified [x] Patient/Family Education   [] Family Conference      [] Family  Training  Recommended Discharge Plan [] home alone   [] home alone with assist prn    [x] Continuous supervision       [] Return home with s/o/spouse/family   []  Assisted living                     [] SNF   Home Health pt, ot, slp, msw, aide, wound care    CURRENT EQUIPEMENT NEEDS:  Equipment needed at discharge: Rolling walker, Wheelchair, and Wheelchair cushion    Wheelchair: Standard Wheelchair hemi height, 18 inch, Anti-tipping devices pair, Wheel lock brake extension handle left, and Wheel lock brake extension handle right, Seat cushion skin protection width less than 22 inches      ADL  Equipments:None    Plan/Adjustments to Plan   [x] Medical conditions exist that require a minimum of 3 times/week physician      oversight and 24-hour rehabilitation nursing to manage/progress the plan of care   [x] Functional deficits require intensive and coordinated therapies to achieve   goals outlined in plan of care    [x] 3 hours therapy 5 days/week OR 15 hours therapy over 7 days    [x] Continued plan of care as patient is showing progress and /or has an expectation to benefit    Patient's plan of care has been reviewed and/or adjusted. Continue treatment as outlined.   I have led this Team Conference and agree with the plan, Michel Bickers, MD, 01/09/2022, 1:52 PM  Physician signature:      Date/time:    Team Conference Recorder Lolita Cram  Date 01/09/2022    Team members participating in today's conference.   [x]   Rivka Barbara, PT     []                      [x]  Bayard Beaver, OT         []  Tempie Hoist, OT       []  Elisha Headland, OT          [x]   Carita Pian, SLP    [x]  Lolita Cram, SW        []  Eula Listen, RN    [x]  Lelon Frohlich, NP  [x]  Other        Malena Peer, RN

## 2022-01-09 NOTE — Progress Notes (Signed)
[facility]  [address], [place], [place] [id number]     INPATIENT REHABILITATION  DAILY PROGRESS NOTE     Date: 01/09/2022    Name: [name] Short Age / Sex: 66 y.o. / female   CSN: [id number] MRN: [id number]   Admit Date: 01/06/2022 Length of Stay: 3 days     Primary Rehabilitation Diagnosis impaired mobility and ADLs in the setting of 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         Subjective:     I personally saw and evaluated this patient face-to-face today.  Patient is sitting in a chair in no apparent distress.  Patient is awake and follows commands    Objective:     Vital Signs:  Patient Vitals for the past 24 hrs:   BP Temp Temp src Pulse Resp SpO2   01/09/22 1545 131/67 97.4 F (36.3 C) Oral 81 18 100 %   01/09/22 0715 130/70 99 F (37.2 C) Oral 81 18 100 %   01/08/22 2008 (!) 147/66 97.6 F (36.4 C) Oral 85 18 100 %   01/08/22 1730 120/63 97.5 F (36.4 C) Oral 80 14 100 %        Physical Examination:  General:  Awake, alert  Cardiovascular:  S1S2+, RRR  Pulmonary:  CTA b/l  GI:  Soft, BS+, NT, ND  Extremities:  No edema  Has a sacral decub    Current Medications:  Current Facility-Administered Medications   Medication Dose Route Frequency    ferrous sulfate (FE TABS 325) EC tablet 325 mg  325 mg Oral Daily with breakfast    insulin glargine (LANTUS) injection vial 30 Units  30 Units SubCUTAneous Daily    ondansetron (ZOFRAN-ODT) disintegrating tablet 4 mg  4 mg SubLINGual Q8H PRN    insulin lispro (HUMALOG) injection vial 3 Units  3 Units SubCUTAneous TID WC    aspirin chewable tablet 81 mg  81 mg Oral Daily    atorvastatin (LIPITOR) tablet 80 mg  80 mg Oral Nightly    enoxaparin (LOVENOX) injection 40 mg  40 mg SubCUTAneous Daily    furosemide (LASIX) tablet 40 mg  40 mg Oral Daily    HYDROcodone-acetaminophen (NORCO) 5-325 MG per tablet 1 tablet  1 tablet Oral Q4H PRN    magnesium oxide (MAG-OX) tablet 400 mg  400 mg Oral Daily    therapeutic multivitamin-minerals  1 tablet  1 tablet Oral Daily    glucose chewable tablet 16 g  4 tablet Oral PRN    dextrose bolus 10% 125 mL  125 mL IntraVENous PRN    Or    dextrose bolus 10% 250 mL  250 mL IntraVENous PRN    glucagon (rDNA) injection 1 mg  1 mg SubCUTAneous PRN    dextrose 10 % infusion   IntraVENous Continuous PRN    acetaminophen (TYLENOL) tablet 650 mg  650 mg Oral Q4H PRN    polyethylene glycol (GLYCOLAX) packet 17 g  17 g Oral Daily PRN    insulin lispro (HUMALOG) injection vial 0-8 Units  0-8 Units SubCUTAneous TID WC    insulin lispro (HUMALOG) injection vial 0-4 Units  0-4 Units SubCUTAneous Nightly    vancomycin (VANCOCIN) 1,000 mg in sodium chloride 0.9 % 250 mL (vial-mate) IVPB  1,000 mg IntraVENous Q12H    piperacillin-tazobactam (ZOSYN) 3,375 mg in sodium chloride 0.9 % 50 mL IVPB (mini-bag)  3,375 mg IntraVENous Q8H       Allergies:  Allergies  Allergen Reactions    Lactose Other (See Comments)     Intolerance-Pt denies       Lab/Data Review:  Recent Results (from the past 24 hour(s))   POCT Glucose    Collection Time: 01/08/22  4:33 PM   Result Value Ref Range    POC Glucose 172 (H) 70 - 110 mg/dL   Protein, urine, random    Collection Time: 01/08/22  5:39 PM   Result Value Ref Range    Protein, Urine, Random 14 (H) <11.9 mg/dL   Creatinine, Random Urine    Collection Time: 01/08/22  5:39 PM   Result Value Ref Range    Creatinine, Ur 36.00 30 - 125 mg/dL   POCT Glucose    Collection Time: 01/08/22  9:18 PM   Result Value Ref Range    POC Glucose 221 (H) 70 - 110 mg/dL   PTH, Intact    Collection Time: 01/09/22  6:15 AM   Result Value Ref Range    Calcium 10.9 (H) 8.5 - 10.1 MG/DL    Pth Intact 161.0 (H) 18.4 - 88.0 pg/mL   Basic Metabolic Panel    Collection Time: 01/09/22  6:15 AM   Result Value Ref Range    Sodium 143 136 - 145 mmol/L    Potassium 3.6 3.5 - 5.5 mmol/L    Chloride 110 100 - 111 mmol/L    CO2 31 21 - 32 mmol/L    Anion Gap 2 (L) 3.0 - 18 mmol/L    Glucose 124 (H) 74 - 99 mg/dL    BUN 24 (H) 7.0  - 18 MG/DL    Creatinine 9.60 0.6 - 1.3 MG/DL    Bun/Cre Ratio 30 (H) 12 - 20      Est, Glom Filt Rate >60 >60 ml/min/1.101m2    Calcium 11.1 (H) 8.5 - 10.1 MG/DL   TSH    Collection Time: 01/09/22  6:15 AM   Result Value Ref Range    TSH, 3RD GENERATION 1.37 0.36 - 3.74 uIU/mL   Vitamin D 25 Hydroxy    Collection Time: 01/09/22  6:15 AM   Result Value Ref Range    Vit D, 25-Hydroxy 27.0 (L) 30 - 100 ng/mL   POCT Glucose    Collection Time: 01/09/22  7:25 AM   Result Value Ref Range    POC Glucose 128 (H) 70 - 110 mg/dL   POCT Glucose    Collection Time: 01/09/22 11:37 AM   Result Value Ref Range    POC Glucose 207 (H) 70 - 110 mg/dL         Assessment:     Primary Rehabilitation Diagnosis impaired mobility and ADLs in the setting of 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     Other Co-Morbid Conditions managed in Rehab   Cervical spondylosis with myelopathy, status post laminectomy and fusion of C3-C6 and external bone growth stimulator placement on Dec 09, 2021  Infected sacral decubitus ulcer-status post debridement  Type 2 diabetes with hyperglycemia  Hypertension  Hypomagnesemia  Anemia    PLAN  -impaired mobility and ADLs in the setting of 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   Continue aspirin and high intensity statin.  PT OT speech therapy     -Cervical spondylosis with myelopathy, status post laminectomy and fusion of C3-C6 and external bone growth stimulator placement on Dec 09, 2021-PT and OT.  Judicious pain control.  Bowel regimen        -  Infected sacral decubitus ulcer-status post debridement.  Wound care IV antibiotics, routine PICC line care     -Type 2 diabetes with hyperglycemia.  Continue Lantus insulin and prandial insulin and sliding scale insulin.  Monitor sugars  On January 09, 2022 I increased Lantus from 30 units to 32 units daily, monitor     -Hypertension-well controlled off medications        -Hypomagnesemia-continue to supplement p.o. magnesium    -Hypokalemia  On January 07, 2022 potassium was 3.0.  Repleted  On January 08, 2022 potassium is 3.7  On January 09, 2022 potassium was 3.6     -Anemia-continue ferrous sulfate, monitor  On January 07, 2022 hemoglobin is 8.3    -Hypercalcemia, primary hyperparathyroidism  On January 07, 2022 calcium was 10.6  On January 08, 2022 calcium is 11  On January 09, 2022 calcium is 10.9  Nephrology is following     Fall precautions, aspiration precautions  I discussed with patient.  Patient states that she has a lymphedema machine at home and wishes to bring it and use it in the hospital.  I have discussed with the rehab manager and she will let me know if that is feasible.    Functional Progress:    PHYSICAL THERAPY    ON ADMISSION MOST RECENT   Balance  Balance  Posture: Poor  Sitting - Static: Good  Sitting - Dynamic: Fair  Standing - Static: Poor  Standing - Dynamic: Poor  Poor  Good  Fair  Poor  Poor      Balance  Balance  Posture: Poor  Sitting - Static: Good  Sitting - Dynamic: Fair  Standing - Static: Poor  Standing - Dynamic: Poor  Posture: Poor  Sitting - Static: Good  Sitting - Dynamic: Fair  Standing - Static: Poor  Standing - Dynamic: Poor        Bed Mobility  Maximum assistance  Maximum assistance  Moderate assistance (Pt requird increased encouragement and time to transition to seated position.)  Moderate Assistance  Maximum assistance  Moderate assistance    Bed Mobility  Rolling to Right: Minimal assistance, Moderate assistance  Rolling to Left: Maximum assistance  Supine to Sit: Minimal assistance, Moderate assistance  Sit to Stand: Moderate Assistance  Sit to Supine: Maximum assistance  Bed to Chair: Moderate assistance      Wheelchair Mobility  Yes       Wheelchair Mobility  Propulsion: Yes         Ambulation  Rolling Walker  Moderate assistance  Level tile  12 Ambulation  Device: Rolling Walker  Assistance: Minimal assistance  Surface: Level tile  Distance: 15 Feet within room   Stairs  No       Stairs  Stairs?: No           Functional  Progress:    OCCUPATIONAL THERAPY    ON ADMISSION MOST RECENT   Eating  Supervision Eating  Feeding: Supervision   Grooming  Supervision, Setup (sitting EOB) Grooming  Grooming: Supervision, Setup (sitting EOB)   Bathing  UB Bathing Supervision, Setup (sitting EOB)  LB Bathing Maximum assistance Bathing  UB Bathing   UE Bathing: Supervision, Setup (sitting EOB)  LB Bathing   QMGQ([id number])@   Upper Body Dressing  Maximum assistance   Upper Body Dressing  UE Dressing: Maximum assistance   Lower Body Dressing  Maximum assistance Lower Body Dressing  LE Dressing: Maximum assistance   Toileting  Maximum assistance Toileting  Toileting: Maximum  assistance   Toilet Transfers  Moderate assistance Toilet Transfers  Toilet Transfer: Moderate assistance   Tub Transfers  Tub Transfers: Not tested  [place] Transfers: Not tested Tub Transfers  Tub Transfers  Tub Transfers: Not tested  SHOWER Transfers  Tub Transfers  Tub Transfers: Not tested     Legend:   7 - Independent   6 - Modified Independent   5 - Standby Assistance / Supervision / Set-up   4 - Minimum Assistance / Contact Guard Assistance   3 - Moderate Assistance   2 - Maximum Assistance   1 - Total Assistance / Dependent             Plan:     1. Justification for continued stay: Fair progression towards established rehabilitation goals.    2. Medical Issues being followed closely:    [x]   Fall and safety precautions     [x]   Wound Care     [x]   Bowel and Bladder Function     [x]   Fluid Electrolyte and Nutrition Balance     [x]   Pain Control      3. Issues that 24 hour rehabilitation nursing is following:    [x]   Fall and safety precautions     [x]   Wound Care     [x]   Bowel and Bladder Function     [x]   Fluid Electrolyte and Nutrition Balance     [x]   Pain Control      [x]   Assistance with and education on in-room safety with transfers to and from the bed, wheelchair, toilet and shower.      4. Acute rehabilitation plan of care:    [x]   Continue  current care and rehab.           [x]   Physical Therapy           [x]   Occupational Therapy           [x]   Speech Therapy     []   Hold Rehab until further notice     5. Medications:    [x]   MAR Reviewed     [x]   Continue Present Medications       6. Chemical DVT Prophylaxis:      [x]   Enoxaparin     []   Unfractionated Heparin     []   Warfarin     []   NOAC     []   Aspirin     []   None     7. Mechanical DVT Prophylaxis:      [x]   TED Stockings     []   Sequential Compression Device     []   None     8. GI Prophylaxis:      []   PPI     []   H2 Blocker     [x]   None / Not indicated     9. Code status:Full    Dragon medical dictation software was used for portions of this report. Unintended errors may occur.      Signed:    , MD      January 09, 2022

## 2022-01-09 NOTE — Plan of Care (Signed)
Problem: Physical Therapy - Adult  Goal: By Discharge: Performs mobility at highest level of function for planned discharge setting.  See evaluation for individualized goals.  Description: Physical Therapy Short Term Goals  Initiated 01/07/2022 and to be accomplished within 7 day(s) (01/14/2022)  1.  Patient will supine to sit, sit to supine, roll right, and roll left  in bed with moderate assistance .    2.  Patient will transfer from bed to chair and chair to bed with minimal assistance/contact guard assist using the least restrictive device.  3.  Patient will perform sit to stand with minimal assistance/contact guard assist  4.  Patient will ambulate with minimal assistance/contact guard assist for 50 feet with the least restrictive device.   5.  Patient will ascend/descend 3 stairs with B handrail(s) with minimal assistance/contact guard assist.    Physical Therapy Long Term Goals  Initiated 01/07/2022 and to be accomplished within 14-21 day(s) (01/28/2022)  1.  Patient will supine to sit, sit to supine, roll right, and roll left in bed with supervision/set-up.    2.  Patient will transfer from bed to chair and chair to bed with supervision/set-up using the least restrictive device.  3.  Patient will perform sit to stand with supervision/set-up.  4.  Patient will ambulate with supervision/set-up for 150 feet with the least restrictive device.   5.  Patient will ascend/descend 4 stairs with 1 handrail(s) with supervision/set-up      Outcome: Progressing      PHYSICAL THERAPY TREATMENT    Patient: [name] Short (66 y.o. female)  Date: 01/09/2022  Diagnosis: Acute CVA (cerebrovascular accident) [facility]) [I63.9]   Precautions: Cervical Spinal Precautions, Fall Risk Precautions  Chart, physical therapy assessment, plan of care and goals were reviewed.    Time in: 0800  Time out : 0917    Patient seen for: NMRE, Patient education, Transfer Training, Gait training, Balance training, Postural re-education    Time in: 1030  Time  out : 1100    Patient seen for: Patient education, Transfer Training, Balance training, Postural re-education    Pain:  Pt pain was reported as 6-8/10 pre-treatment.  Pt pain was reported as 8/10 post-treatment.  Intervention: Pt provided with education re: repositioning and postural re-education.  Pt received pain medication from nursing staff at start of treatment session.    During second treatment session, pt initially c/o 8/10 sacral pain at site of wound vac.  Pt re-educated re: safe repositioning and repositioned herself in w/c with PT education.  PT spoke with nurse who came to room to ensure wound vac dressing was intact.  Pt reported relief in discomfort after she repositioned herself in w/c.    Patient identified with name and DOB: Yes    SUBJECTIVE:      Pt reports she did not sleep well overnight.  She notes that at some point the prior evening while she was on the bedside commode she voided and then realized she had had a loose bowel movement without feeling it.  She indicated that she received assistance from nursing at the time and her nurse today was notified and aware.    OBJECTIVE DATA SUMMARY:    Objective:   Education: Education Given To: Patient  Education Provided: Role of Therapy;Plan of Care;Precautions;Safety;Transfer Training;Mobility Training;Family Education;DME/Home Modifications;Fall Prevention Strategies  Education Method: Demonstration;Verbal  Barriers to Learning: Cognition  Education Outcome: Verbalized understanding;Continued education needed;Demonstrated understanding    TRANSFERS Daily Assessment    Sit to Stand Moderate  Assistance with increased time for sit to stand from bed and from bedside commode at times requiring multiple attempts with pt reporting pain as limiting factor to achieve lift-off.  Pt requires minimal assistance for slow controlled descent with stand to sit with maximal verbal cues for increased hip flexion.    Transfer Assist Score Moderate assistance for sit  <> stand to and from RW and minimal assistance to moderate assistance in standing for balance and RW management with maximal verbal cues for sequencing and B foot clearance.    During first treatment session, pt performed stand step transfer to her right bed to bedside commode.  During second treatment session, pt performed stand step transfer from commode to w/c to her left after nursing assisted with moving w/c to place commode behind pt when she reported an urgency for voiding.  Pt participated in toileting on commode during both treatment sessions.  Nursing present and nursing and PT educated pt re: importance of participating in a toileting schedule to promote improved awareness and continence.  Pt verbalized understanding.        GAIT Daily Assessment    Gait Deviations Decreased step height;Decreased step length    Assistive device Rolling Walker    Ambulation assistance - surface  Minimal assistance  Level tile    Distance 15 Feet within room    Comments Pt ambulates with minimal assistance for safety and balance with wide BOS and decreased B foot clearance requiring moderate verbal cues for forward gaze.        BALANCE Daily Assessment    Posture Poor    Sitting - Static Good    Sitting - Dynamic Fair    Standing - Static Poor    Standing - Dynamic Poor    Comments Pt sat EOB x 30 minutes at start of treatment session without posterior support to participate in upper body bathing and dressing with increased time to perform and maximal verbal cuing for attention to maintaining precautions.  Pt sits with posterior lean and forward head requiring maximal manual cues for anterior weight shift to maintain weight bearing through B feet and maximal manual cues for lumbar and thoracic extensor engagement.      ASSESSMENT:  Pt is slowly progressing requiring decreased physical assistance with functional transfers.  However, pt appears limited this session by frequent toileting needs as well as increased pain  complaints requiring maximal education re: repositioning for skin integrity and pressure relief.  Progression toward goals:  []       Improving appropriately and progressing toward goals  [x]       Improving slowly and progressing toward goals  []       Not making progress toward goals and plan of care will be adjusted      PLAN:  Patient continues to benefit from skilled intervention to address the above impairments.  Continue treatment per established plan of care.  Emphasize NMRE and postural re-education to promote increased safety and independence upon d/c home.  Discharge Recommendations:  Home with Home health PT;24 hour supervision or assist;Subacute/Skilled Nursing Facility  Further Equipment Recommendations for Discharge:        Rolling Walker             Estimated Discharge Date: 2-3 weeks    Activity Tolerance:   Fair  Please refer to the flowsheet for vital signs taken during this treatment.    After treatment:   []  Patient left in no apparent distress in bed  [x]  Patient  left in no apparent distress sitting up in chair  []  Patient left in no apparent distress in w/c mobilizing under own power  []  Patient left in no apparent distress dining area  []  Patient left in no apparent distress mobilizing under own power  [x]  Call bell left within reach  []  Nursing notified  []  Caregiver present  []  Bed alarm activated   [x]  Chair alarm activated      Rosario AdieLaura  Jamarien Rodkey, PT, DPT  01/09/2022

## 2022-01-09 NOTE — Plan of Care (Signed)
Problem: SLP Adult - Disturbed Thought Process  Goal: By Discharge: Demonstrates cognitive skills at highest level of function for planned discharge setting.   See evaluation for individualized goals.  Description: Long term goals (Initiated 01/07/22 to be accomplished by 01/21/2022)  Patient will:  1. Be oriented x 3 and recall events of the day, supervision.  2. Follow 3 part and complex commands, 90% accuracy.  3.  Listen to 3-4 sentence length paragraphs and respond to questions re: content with 90% accuracy.  4.  List 10-12 items in categories of increasing abstraction, supervision.  5.  Discuss similarities/differences given 2 items, 80-90% accuracy.    Short term goals (by 01/14/22)  Patient will:  1.  Be oriented x 3 and recall events of the day, min assist.  2.  Follow 3 part and complex commands, 70-60% accuracy.  3.  Listen to 3-4 sentence length paragraphs and respond to questions re: content with 70-80% accuracy.  4.  List 10-12 items in categories of increasing abstraction, min assist.  5.  Discuss similarities/differences given 2 items, 70-80% accuracy.      01/09/2022 1333 by Lindi Adie  Note:   SPEECH-LANGUAGE TREATMENT    Patient: [name] Short (66 y.o. female)  Date: 01/09/2022  Diagnosis: Acute CVA (cerebrovascular accident) [facility]) [I63.9] Acute CVA (cerebrovascular accident) (HCC)      Precautions: Standard  PLOF: As per H&P     ASSESSMENT:  Ms. Brinkmeier was alert and participated in all tasks.   Progression toward goals:  [x]        Improving appropriately and progressing toward goals  []        Improving slowly and progressing toward goals  []        Not making progress toward goals and plan of care will be adjusted     PLAN:  Patient continues to benefit from skilled intervention to address the above impairments.  Continue treatment per established plan of care.     SUBJECTIVE:       Patient stated "I point because it's right there".    OBJECTIVE:   Mental Status:  Ms. Foulkes was positive and focused during  today's session.   Treatment & Interventions:    Neuro-Linguistics:   Orientation:   Independent  Recent recall:   Independent  Sequencing 4 items:  Household 5/5 Independently      Social: 4/4 with supervision  Concrete divergent naming:  Listing items in recipe; listed 8 with supervision      PAIN:  Start of Tx: no pain reported but apparent discomfort (facial expressions)  End of Tx: no pain reported but apparent discomfort (facial expressions)    After treatment:   [x]        Patient left in no apparent distress sitting up in chair  []        Patient left in no apparent distress in bed  [x]        Call bell left within reach  []        Nursing notified  []        Caregiver present  []        Bed alarm activated      COMMUNICATION/EDUCATION:   [x]  Patient educated regarding compensatory speech/language/comprehension techniques provided via demonstration, verbalization and teach back of comprehension  [x]  Patient/family have participated as able in goal setting and plan of care.  [x]  Patient/family agree to work toward stated goals and plan of care.  []  Patient understands intent and goals of therapy, neutral about participation.  []   Patient unable to participate in goal setting/plan of care secondary to cognition, hearing/vision deficits; education ongoing with interdisciplinary staff       HUNTER BOWEN  Carita Pian, [place]

## 2022-01-10 LAB — BASIC METABOLIC PANEL
Anion Gap: 3 mmol/L (ref 3.0–18)
BUN/Creatinine Ratio: 26 — ABNORMAL HIGH (ref 12–20)
BUN: 20 mg/dL — ABNORMAL HIGH (ref 7.0–18)
CO2: 30 mmol/L (ref 21–32)
Calcium: 10.5 mg/dL — ABNORMAL HIGH (ref 8.5–10.1)
Chloride: 112 mmol/L — ABNORMAL HIGH (ref 100–111)
Creatinine: 0.77 mg/dL (ref 0.6–1.3)
Est, Glom Filt Rate: >60 mL/min/{1.73_m2} (ref >60)
Glucose: 112 mg/dL — ABNORMAL HIGH (ref 74–99)
Potassium: 3.6 mmol/L (ref 3.5–5.5)
Sodium: 145 mmol/L (ref 136–145)

## 2022-01-10 LAB — KAPPA/LAMBDA QUANTITATIVE FREE LIGHT CHAINS, SERUM
Free Kappa Light Chains: 135.5 mg/L — ABNORMAL HIGH (ref 3.3–19.4)
Free Lambda Light Chains: 20.7 mg/L (ref 5.7–26.3)
K/L Ratio: 6.55 — ABNORMAL HIGH (ref 0.26–1.65)

## 2022-01-10 LAB — POCT GLUCOSE
POC Glucose: 205 mg/dL — ABNORMAL HIGH (ref 70–110)
POC Glucose: 120 mg/dL — ABNORMAL HIGH (ref 70–110)
POC Glucose: 245 mg/dL — ABNORMAL HIGH (ref 70–110)
POC Glucose: 175 mg/dL — ABNORMAL HIGH (ref 70–110)

## 2022-01-10 LAB — VANCOMYCIN LEVEL, RANDOM: Vancomycin Rm: 27.5 ug/mL (ref 5.0–40.0)

## 2022-01-10 MED ORDER — VANCOMYCIN HCL 750 MG IV SOLR
750 MG | Freq: Two times a day (BID) | INTRAVENOUS | Status: AC
Start: 2022-01-10 — End: 2022-01-25
  Administered 2022-01-11 – 2022-01-24 (×28): 750 mg via INTRAVENOUS

## 2022-01-10 MED FILL — FERROUS SULFATE 325 (65 FE) MG PO TBEC: 325 (65 Fe) MG | ORAL | Qty: 1

## 2022-01-10 MED FILL — VANCOMYCIN HCL 750 MG IV SOLR: 750 MG | INTRAVENOUS | Qty: 750

## 2022-01-10 MED FILL — HYDROCODONE-ACETAMINOPHEN 5-325 MG PO TABS: 5-325 MG | ORAL | Qty: 1

## 2022-01-10 MED FILL — PIPERACILLIN SOD-TAZOBACTAM SO 3.375 (3-0.375) G IV SOLR: 3.375 (3-0.375) g | INTRAVENOUS | Qty: 3375

## 2022-01-10 MED FILL — FUROSEMIDE 40 MG PO TABS: 40 MG | ORAL | Qty: 1

## 2022-01-10 MED FILL — LOVENOX 40 MG/0.4ML IJ SOSY: 40 MG/0.4ML | INTRAMUSCULAR | Qty: 0.4

## 2022-01-10 MED FILL — ASPIRIN LOW DOSE 81 MG PO CHEW: 81 MG | ORAL | Qty: 1

## 2022-01-10 MED FILL — THERAPEUTIC-M PO TABS: ORAL | Qty: 1

## 2022-01-10 MED FILL — VANCOMYCIN HCL 1 G IV SOLR: 1 g | INTRAVENOUS | Qty: 1000

## 2022-01-10 MED FILL — MAGNESIUM OXIDE -MG SUPPLEMENT 400 (240 MG) MG PO TABS: 400 (240 Mg) MG | ORAL | Qty: 1

## 2022-01-10 MED FILL — LANTUS 100 UNIT/ML SC SOLN: 100 UNIT/ML | SUBCUTANEOUS | Qty: 32

## 2022-01-10 MED FILL — HUMALOG 100 UNIT/ML IJ SOLN: 100 UNIT/ML | INTRAMUSCULAR | Qty: 3

## 2022-01-10 NOTE — Progress Notes (Signed)
INTERDISCIPLINARY TEAM FOLLOW-UP  Patient: [name] Short (66 y.o. female)  Date: 01/10/2022  Diagnosis: Acute CVA (cerebrovascular accident) [facility]) [I63.9] Acute CVA (cerebrovascular accident) [facility])      Precautions:      Met with patient to discuss the findings from 01/10/2022 Team Conference.    Patient requested further information and/or had questions:   no       Lolita Cram

## 2022-01-10 NOTE — Plan of Care (Signed)
Problem: Occupational Therapy - Adult  Goal: By Discharge: Performs self-care activities at highest level of function for planned discharge setting.  See evaluation for individualized goals.  Description: Occupational Therapy Goals   Long Term Goals  Initiated 01/07/2022 and to be accomplished within 2-3 week(s) 01/28/2022  1. Pt will perform self-feeding with ModI.  2. Pt will perform grooming with S/Setup  3. Pt will perform UB bathing with S/Setup  4. Pt will perform LB bathing with Mina.  5. Pt [facility].  6. Pt will perform UB dressing with S/Setup  7. Pt will perform LB dressing with Mina.  8. Pt will perform toileting task with [facility].  9. Pt will perform toilet transfer with [facility].   10.  Pt will perform an IADL task with good safety and Mina.      Short Term Goals   Initiated 01/07/2022 and to be accomplished within 7 day(s) 01/14/2022  1. Pt will perform self-feeding with Setup.  2. Pt will perform grooming with Setup.  3. Pt will perform UB bathing with Setup.  4. Pt will perform LB bathing with Moda.  5. Pt will perform tub/shower transfer with  Moda.  6. Pt will perform UB dressing with Mina.  7. Pt will perform LB dressing with Moda.  8. Pt will perform toileting task with Moda.  9. Pt will perform toilet transfer with Moda.         Outcome: Progressing   Occupational Therapy TREATMENT    Patient: [name] Short   66 y.o.    Patient identified with name and DOB: Yes    Date: 01/10/2022    First Tx Session  Time In:  0930  Time Out:  1100    Diagnosis: Acute CVA (cerebrovascular accident) [facility]) [I63.9]   Precautions: Restrictions/Precautions: Fall Risk, Surgical Protocols (Cervical Spinal Precautions)  Spinal Precautions: No Bending, No Lifting, No Twisting      Chart, occupational therapy assessment, plan of care, and goals were reviewed.     Pain:  Pt reports 8/10 pain or discomfort prior to treatment at buttocks.  Pt reports 8/10 pain or discomfort post treatment at  buttocks.  Intervention Provided: N/A      SUBJECTIVE:   Patient stated "I want my medication"    OBJECTIVE DATA SUMMARY:     EATING Daily Assessment   Functional Level Supervision   Clinical factors       GROOMING Daily Assessment   Functional Level Supervision?setup   Clinical factors        UPPER BODY BATHING Daily Assessment   Functional Level Supervision;Stand by assistance   Clinical Factors  Setup EOB     LOWER BODY BATHING Daily Assessment   Functional Level  Moderate assistance   Clinical Factors Pt requires an increased amount of time to complete 2/2 to stress incontinence of bladder requiring multiple brief changes. Pt able to perform peri wash but not buttock wash 2/2 to wound vac.  Pt completed washing BLE seated on bed using LHS     UPPER BODY DRESSING/UNDRESSING Daily Assessment   Functional Level Supervision;Stand by assistance   Clinical Factors  Pt thread BUE through dress straps and pulled down overhead.     LOWER BODY DRESSING/UNDRESSING Daily Assessment   Functional Level Moderate assistance;Maximum assistance   Clinical Factors  Pt required Maxa to don compression hose and Mina to don shoes seated EOB.  Pt used long handle shoe horn to complete task.  TOILETING    Functional Level Moderate assistance   Clinical factors  Pt requires asst with buttock hygiene 2/2 wound vac.     TOILET TRANSFER  Daily Assessment   Transfer score Minimal assistance;Moderate assistance   Comments  Pt requires asst with wound vac and increased time to side step with RW     WHEELCHAIR/BED TRANSFER INDEPENDENCE Daily Assessment   Transfer Technique   Stand step   Level of Assistance Minimal assistance (using RW)   Equipment     Comments         ASSESSMENT:  Pt making slow inconsistent progress towards goals 2/2 showing S/Setup/SBA-Maxa for ADL.  Pt incontinence of bladder and decubitus ulcer  at buttocks requiring wound vacuum limits progress.  Pt also noted to require moderate vcs to follow spinal neck  precautions.  Progression toward goals:  []           Improving appropriately and progressing toward goals  [x]           Improving slowly and progressing toward goals  []           Not making progress toward goals and plan of care will be adjusted     PLAN:  Patient continues to benefit from skilled intervention to address the above impairments.  Continue treatment per established plan of care.  Discharge Recommendations:  Home Health with 24 hr asst vs SNF  Further Equipment Recommendations for Discharge:  None at this time     Activity Tolerance:    Fair-    EDUCATION:   Education Given To: Patient  Education Provided: Role of Therapy;Plan of Care;Precautions;Safety;Transfer Training;Mobility Training;Family Education;DME/Home Modifications;Fall Prevention Strategies;Home Exercise Program  Education Method: Demonstration;Verbal  Barriers to Learning: Cognition  Education Outcome: Verbalized understanding;Continued education needed;Demonstrated understanding    COMMUNICATION:   []  Home safety education was provided and the patient/caregiver indicated understanding.  [x]  Patient/family have participated as able in goal setting and plan of care.  []  Patient/family agree to work toward stated goals and plan of care.  []  Patient understands intent and goals of therapy, but is neutral about his/her participation.  []  Patient is unable to participate in goal setting and plan of care.    Please refer to the flowsheet for vital signs taken during this treatment.  After treatment:   []   Patient left in no apparent distress sitting up in chair   [x]   Patient left in no apparent distress in bed  []   Call bell left within reach  []   Nursing notified  []   Caregiver present  []   Bed alarm activated    Estimated LOS:2-3 weeks  IRF-PAI Completed: N/A  Entered Differentiated Treatment minutes: N/A    , OT

## 2022-01-10 NOTE — Plan of Care (Addendum)
Problem: SLP Adult - Disturbed Thought Process  Goal: By Discharge: Demonstrates cognitive skills at highest level of function for planned discharge setting.   See evaluation for individualized goals.  Description: Long term goals (Initiated 01/07/22 to be accomplished by 01/21/2022)  Patient will:  1. Be oriented x 3 and recall events of the day, supervision.  2. Follow 3 part and complex commands, 90% accuracy.  3.  Listen to 3-4 sentence length paragraphs and respond to questions re: content with 90% accuracy.  4.  List 10-12 items in categories of increasing abstraction, supervision.  5.  Discuss similarities/differences given 2 items, 80-90% accuracy.    Short term goals (by 01/14/22)  Patient will:  1.  Be oriented x 3 and recall events of the day, min assist.  2.  Follow 3 part and complex commands, 70-60% accuracy.  3.  Listen to 3-4 sentence length paragraphs and respond to questions re: content with 70-80% accuracy.  4.  List 10-12 items in categories of increasing abstraction, min assist.  5.  Discuss similarities/differences given 2 items, 70-80% accuracy.      Note:   SPEECH-LANGUAGE TREATMENT    Patient: Rolanda Campa (66 y.o. female)  Date: 01/10/2022  Diagnosis: Acute CVA (cerebrovascular accident) [facility]) [I63.9] Acute CVA (cerebrovascular accident) (HCC)      Precautions: Standard  PLOF: As per H&P     ASSESSMENT:  Patient tolerated therapy and participated in all tasks.   Progression toward goals:  [x]        Improving appropriately and progressing toward goals  []        Improving slowly and progressing toward goals  []        Not making progress toward goals and plan of care will be adjusted     PLAN:  Patient continues to benefit from skilled intervention to address the above impairments.  Continue treatment per established plan of care.     SUBJECTIVE:   Patient stated "we going to finish that recipe".    OBJECTIVE:   Mental Status:  Patient was alert and focused on all tasks during today's session.    Treatment & Interventions:    Neuro-Linguistics:   Orientation:   Independent  Recent recall:   Independent  3 word recall:   Independent   List items in category:  Recipe; listed 8 with min assist  Similarities/differences: 11/11 independently     Mrs. Petillo also participated in the weekly Stroke Support Group.  Patients (3) engaged in an initial ice breaker to start the session.  The SLP then provided education about stroke by discussing risk factors and using the acronym BE FAST.  Printed copies of the information was provided with all of the group members and they were asked to share this with family.    Response & Tolerance to Activities:   Patient participates and gives maximum effort to tasks.     PAIN:  Start of Tx: no report but apparent (constant adjusting, facial expressions)  End of Tx: no report but apparent (constant adjusting, facial expressions)    After treatment:   []        Patient left in no apparent distress sitting up in chair  [x]        Patient left in no apparent distress in bed  [x]        Call bell left within reach  []        Nursing notified  [x]        Caregiver present  []   Bed alarm activated      COMMUNICATION/EDUCATION:   [x]  Patient educated regarding compensatory speech/language/comprehension techniques provided via demonstration, verbalization and teach back of comprehension  [x]  Patient/family have participated as able in goal setting and plan of care.  []  Patient/family agree to work toward stated goals and plan of care.  []  Patient understands intent and goals of therapy, neutral about participation.  []  Patient unable to participate in goal setting/plan of care secondary to cognition, hearing/vision deficits; education ongoing with interdisciplinary staff       HUNTER BOWEN  , CCC-SLP

## 2022-01-10 NOTE — Progress Notes (Signed)
In Patient Progress note      Admit Date: 01/06/2022      Impression:        Assessment and plan:  #1 chronic asymptomatic hypercalcemia associated with elevated PTH.  This is primary hyperparathyroidism.  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  She does follow with Dr. Ovidio Hanger.  Calcium today is 11 which corrected would be much higher.  Checking ionized calcium and repeat PTH.  #2 infected necrotic sacral ulcer  #3 diabetes mellitus type 2  #4 hypertension  #5 cervical spondylosis with myelopathy     Plan:  #1 check ionized calcium PTH and vitamin D levels --> pending  #2 maintain good hydration  #3 monitor renal functions and electrolytes  #4 check urine protein creatinine ratio, SPEP and serum free light chains    Will follow peripherally over the weekend      Discussed plan with the patient and primary team     Please call with questions,     Robyne Askew, MD FASN  Cell [phone]  Pager: [phone]  Fax   [phone]        Subjective:     - No acute over night events.  - respiratory - stable  - hemodynamics - stable, no pressrs  - UOP-  - Nutrition -    Objective:     BP (!) 135/58   Pulse 93   Temp 97.7 F (36.5 C) (Oral)   Resp 16   Ht 4\' 11"  (1.499 m)   SpO2 100%   BMI 31.69 kg/m       Intake/Output Summary (Last 24 hours) at 01/11/2022 0111  Last data filed at 01/11/2022 0000  Gross per 24 hour   Intake 960 ml   Output 1225 ml   Net -265 ml       Physical Exam:     Gen NAD  HENT mmm  RS AEBE   CVS s1s2 wnl no JVD  Ext edema +  Skin no rashes  Neuro     Data Review:    No results for input(s): WBC, RBC, HEMOGLOBIN, HCT, MCV, MCH, MCHC, RDW, PLATELET, MPV in the last 72 hours.  Recent Labs     01/08/22  0530 01/09/22  0615 01/10/22  0500   BUN 22* 24* 20*   K 3.7 3.6 3.6   NA 142 143 145   CL 110 110 112*   CO2 31 31 30        03/12/22, MD

## 2022-01-10 NOTE — Progress Notes (Signed)
[facility]  [address], [place], [place] [id number]     INPATIENT REHABILITATION  DAILY PROGRESS NOTE     Date: 01/10/2022    Name: [name] AppleHelena Short Age / Sex: 66 y.o. / female   CSN: [id number] MRN: [id number]   Admit Date: 01/06/2022 Length of Stay: 4 days     Primary Rehabilitation Diagnosis impaired mobility and ADLs in the setting of 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         Subjective:     I personally saw and evaluated this patient face-to-face today.  Patient is laying in bed in no apparent distress.  Daughter is at bedside    Objective:     Vital Signs:  Patient Vitals for the past 24 hrs:   BP Temp Temp src Pulse Resp SpO2   01/10/22 1613 133/68 97 F (36.1 C) Oral 74 18 100 %   01/10/22 0958 -- -- -- -- 18 --   01/10/22 0722 135/78 98.1 F (36.7 C) Oral 80 18 98 %   01/09/22 2330 -- -- -- -- -- 99 %   01/09/22 2005 (!) 145/70 98.2 F (36.8 C) Oral 50 18 --        Physical Examination:  General:  Awake, alert  Cardiovascular:  S1S2+, RRR  Pulmonary:  CTA b/l  GI:  Soft, BS+, NT, ND  Extremities:  + edema.  Patient is wearing graded compression stockings  Has sacral decub    Current Medications:  Current Facility-Administered Medications   Medication Dose Route Frequency    vancomycin (VANCOCIN) 750 mg in sodium chloride 0.9 % 250 mL IVPB (vial-mate)  750 mg IntraVENous Q12H    ferrous sulfate (FE TABS 325) EC tablet 325 mg  325 mg Oral Daily with breakfast    insulin glargine (LANTUS) injection vial 32 Units  32 Units SubCUTAneous Daily    ondansetron (ZOFRAN-ODT) disintegrating tablet 4 mg  4 mg SubLINGual Q8H PRN    insulin lispro (HUMALOG) injection vial 3 Units  3 Units SubCUTAneous TID WC    aspirin chewable tablet 81 mg  81 mg Oral Daily    atorvastatin (LIPITOR) tablet 80 mg  80 mg Oral Nightly    enoxaparin (LOVENOX) injection 40 mg  40 mg SubCUTAneous Daily    furosemide (LASIX) tablet 40 mg  40 mg Oral Daily    HYDROcodone-acetaminophen (NORCO) 5-325 MG  per tablet 1 tablet  1 tablet Oral Q4H PRN    magnesium oxide (MAG-OX) tablet 400 mg  400 mg Oral Daily    therapeutic multivitamin-minerals 1 tablet  1 tablet Oral Daily    glucose chewable tablet 16 g  4 tablet Oral PRN    dextrose bolus 10% 125 mL  125 mL IntraVENous PRN    Or    dextrose bolus 10% 250 mL  250 mL IntraVENous PRN    glucagon (rDNA) injection 1 mg  1 mg SubCUTAneous PRN    dextrose 10 % infusion   IntraVENous Continuous PRN    acetaminophen (TYLENOL) tablet 650 mg  650 mg Oral Q4H PRN    polyethylene glycol (GLYCOLAX) packet 17 g  17 g Oral Daily PRN    insulin lispro (HUMALOG) injection vial 0-8 Units  0-8 Units SubCUTAneous TID WC    insulin lispro (HUMALOG) injection vial 0-4 Units  0-4 Units SubCUTAneous Nightly    piperacillin-tazobactam (ZOSYN) 3,375 mg in sodium chloride 0.9 % 50 mL IVPB (mini-bag)  3,375 mg IntraVENous Q8H  Allergies:  Allergies   Allergen Reactions    Lactose Other (See Comments)     Intolerance-Pt denies       Lab/Data Review:  Recent Results (from the past 24 hour(s))   POCT Glucose    Collection Time: 01/09/22  8:39 PM   Result Value Ref Range    POC Glucose 205 (H) 70 - 110 mg/dL   Vancomycin Level, Random    Collection Time: 01/10/22  5:00 AM   Result Value Ref Range    Vancomycin Rm 27.5 5.0 - 40.0 UG/ML   Basic Metabolic Panel    Collection Time: 01/10/22  5:00 AM   Result Value Ref Range    Sodium 145 136 - 145 mmol/L    Potassium 3.6 3.5 - 5.5 mmol/L    Chloride 112 (H) 100 - 111 mmol/L    CO2 30 21 - 32 mmol/L    Anion Gap 3 3.0 - 18 mmol/L    Glucose 112 (H) 74 - 99 mg/dL    BUN 20 (H) 7.0 - 18 MG/DL    Creatinine 4.19 0.6 - 1.3 MG/DL    Bun/Cre Ratio 26 (H) 12 - 20      Est, Glom Filt Rate >60 >60 ml/min/1.57m2    Calcium 10.5 (H) 8.5 - 10.1 MG/DL   POCT Glucose    Collection Time: 01/10/22  7:25 AM   Result Value Ref Range    POC Glucose 120 (H) 70 - 110 mg/dL   POCT Glucose    Collection Time: 01/10/22 11:20 AM   Result Value Ref Range    POC Glucose 245  (H) 70 - 110 mg/dL   POCT Glucose    Collection Time: 01/10/22  4:11 PM   Result Value Ref Range    POC Glucose 175 (H) 70 - 110 mg/dL         Assessment:     Primary Rehabilitation Diagnosis impaired mobility and ADLs in the setting of 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     Other Co-Morbid Conditions managed in Rehab   Cervical spondylosis with myelopathy, status post laminectomy and fusion of C3-C6 and external bone growth stimulator placement on Dec 09, 2021  Infected sacral decubitus ulcer-status post debridement  Type 2 diabetes with hyperglycemia  Hypertension  Hypomagnesemia  Anemia    PLAN  -impaired mobility and ADLs in the setting of 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   Continue aspirin and high intensity statin.  Physical therapy, Occupational Therapy, speech therapy     -Cervical spondylosis with myelopathy, status post laminectomy and fusion of C3-C6 and external bone growth stimulator placement on Dec 09, 2021-PT and OT.  Judicious pain control.  Bowel regimen        -Infected sacral decubitus ulcer-status post debridement.  Continue wound care and IV antibiotics.  Routine PICC line care     -Type 2 diabetes with hyperglycemia.  Continue Lantus insulin and prandial insulin and sliding scale insulin.  Monitor sugars  On January 09, 2022 I increased Lantus from 30 units to 32 units daily, monitor.  Continue current regimen     -Hypertension-well controlled off medications        -Hypomagnesemia-continue to supplement p.o. magnesium    -Hypokalemia  On January 07, 2022 potassium was 3.0.  Repleted  On January 08, 2022 potassium is 3.7  On January 09, 2022 potassium was 3.6  On January 10, 2022 potassium is 3.6     -Anemia-continue ferrous  sulfate, monitor  On January 07, 2022 hemoglobin is 8.3    -Hypercalcemia, primary hyperparathyroidism  On January 07, 2022 calcium was 10.6  On January 08, 2022 calcium is 45  On January 09, 2022 calcium is 10.9  On January 10, 2022 calcium is 10.5  Nephrology is following     Fall  precautions, aspiration precautions  I discussed with patient and daughter at bedside.  I discussed with RN.    Functional Progress:    PHYSICAL THERAPY    ON ADMISSION MOST RECENT   Balance  Balance  Posture: Poor  Sitting - Static: Good  Sitting - Dynamic: Fair  Standing - Static: Poor  Standing - Dynamic: Poor  Poor  Good  Fair            Balance  Balance  Posture: Poor  Sitting - Static: Good  Sitting - Dynamic: Fair  Standing - Static: Poor  Standing - Dynamic: Poor  Posture: Poor  Sitting - Static: Good  Sitting - Dynamic: Fair              Bed Mobility  Maximum assistance  Maximum assistance  Moderate assistance (Pt requird increased encouragement and time to transition to seated position.)  Moderate Assistance  Maximum assistance  Moderate assistance    Bed Mobility  Rolling to Right: Moderate assistance (with increased time and maximal education and cuing)  Rolling to Left: Maximum assistance  Supine to Sit: Maximum assistance (through right sidelying to right side to maintain spinal precautions with increased time and maximal verbal cues)  Sit to Stand: Minimal Assistance  Sit to Supine: Maximum assistance  Bed to Chair: Moderate assistance      Wheelchair Mobility  Yes       Wheelchair Mobility  Propulsion: Yes         Ambulation  Rolling Walker  Moderate assistance  Level tile  12 Ambulation  Device: Rolling Walker  Assistance: Minimal assistance  Surface: Level tile  Distance: 120 Feet   Stairs  No       Stairs  Stairs?: No           Functional Progress:    OCCUPATIONAL THERAPY    ON ADMISSION MOST RECENT   Eating  Supervision Eating  Feeding: Supervision   Grooming  Supervision, Setup (sitting EOB) Grooming  Grooming: Supervision   Bathing  UB Bathing Supervision, Setup (sitting EOB)  LB Bathing Maximum assistance Bathing  UB Bathing   UE Bathing: Supervision, Stand by assistance  LB Bathing   ZOXW([id number])@   Upper Body Dressing  Maximum assistance   Upper Body Dressing  UE Dressing:  Supervision, Stand by assistance   Lower Body Dressing  Maximum assistance Lower Body Dressing  LE Dressing: Moderate assistance, Maximum assistance   Toileting  Maximum assistance Toileting  Toileting: Moderate assistance   Toilet Transfers  Moderate assistance Toilet Transfers  Toilet Transfer: Minimal assistance, Moderate assistance   Tub Transfers  Tub Transfers: Not tested  Engineer, manufacturing Transfers: Not tested Tub Transfers  Tub Transfers  Tub Transfers: Not tested  SHOWER Transfers  Tub Transfers  Tub Transfers: Not tested     Legend:   7 - Independent   6 - Modified Independent   5 - Standby Assistance / Supervision / Set-up   4 - Minimum Assistance / Contact Guard Assistance   3 - Moderate Assistance   2 - Maximum Assistance   1 - Total Assistance / Dependent  Plan:     1. Justification for continued stay: Fair progression towards established rehabilitation goals.  Patient is now able to ambulate 120 feet with minimal assistance and a rolling walker in comparison to being able to ambulate 12 feet with moderate assistance and a rolling walker at the time of admission.    2. Medical Issues being followed closely:    [x]   Fall and safety precautions     [x]   Wound Care     [x]   Bowel and Bladder Function     [x]   Fluid Electrolyte and Nutrition Balance     [x]   Pain Control      3. Issues that 24 hour rehabilitation nursing is following:    [x]   Fall and safety precautions     [x]   Wound Care     [x]   Bowel and Bladder Function     [x]   Fluid Electrolyte and Nutrition Balance     [x]   Pain Control      [x]   Assistance with and education on in-room safety with transfers to and from the bed, wheelchair, toilet and shower.      4. Acute rehabilitation plan of care:    [x]   Continue current care and rehab.           [x]   Physical Therapy           [x]   Occupational Therapy           [x]   Speech Therapy     []   Hold Rehab until further notice     5. Medications:    [x]   MAR Reviewed     [x]    Continue Present Medications       6. Chemical DVT Prophylaxis:      [x]   Enoxaparin     []   Unfractionated Heparin     []   Warfarin     []   NOAC     []   Aspirin     []   None     7. Mechanical DVT Prophylaxis:      [x]   TED Stockings     []   Sequential Compression Device     []   None     8. GI Prophylaxis:      []   PPI     []   H2 Blocker     [x]   None / Not indicated     9. Code status:Full    Dragon medical dictation software was used for portions of this report. Unintended errors may occur.      Signed:    , MD      January 10, 2022

## 2022-01-10 NOTE — Progress Notes (Signed)
Pharmacy Pharmacokinetic Monitoring Service - Vancomycin    Consulting Provider: Jonah Blue, MD   Indication:  infected sacral decub.   Target Concentration: Goal AUC/MIC 400-600 mg*hr/L  Day of Therapy (Rehab): 5 of 31 (through 02/06/22 as per ID recommendations).  Additional Antimicrobials: Piperacillin/Tazobactam    Pertinent Laboratory Values:   Temp: 99.1 F (37.3 C)  Recent Labs     01/09/22  0615 01/10/22  0500   BUN 24* 20*     Estimated Creatinine Clearance: 68 mL/min (based on SCr of 0.77 mg/dL).    Pertinent Cultures:  Culture Date Source Results   05/27 Blood x 2 NG   05/27 Wound, Sacral Proteus mirabilis (sens to Zosyn)  Enterococcus faecalis (sens to Vanc)     MRSA Nasal Swab: N/A. Non-respiratory infection    Assessment:  Date/Time Current Dose Concentration Timing of Concentration (h) AUC   6/05 1000 mg q12h 17.4 9.2h 415   6/06 1000 mg q12h - - -   06/07 " "      06/08 " "      06/09 " " 27.5 6 634   Note: Serum concentrations collected for AUC dosing may appear elevated if collected in close proximity to the dose administered, this is not necessarily an indication of toxicity    Plan:  Decrease Vancomycin to 750 mg IV q12h  Anticipated AUC of 481 and trough concentration of 15.2 at steady state  Renal labs as indicated   Vancomycin level ordered for 06/11 with AM labs  Pharmacy will continue to monitor patient and adjust therapy as indicated

## 2022-01-10 NOTE — Plan of Care (Signed)
Problem: Physical Therapy - Adult  Goal: By Discharge: Performs mobility at highest level of function for planned discharge setting.  See evaluation for individualized goals.  Description: Physical Therapy Short Term Goals  Initiated 01/07/2022 and to be accomplished within 7 day(s) (01/14/2022)  1.  Patient will supine to sit, sit to supine, roll right, and roll left  in bed with moderate assistance .    2.  Patient will transfer from bed to chair and chair to bed with minimal assistance/contact guard assist using the least restrictive device.  3.  Patient will perform sit to stand with minimal assistance/contact guard assist  4.  Patient will ambulate with minimal assistance/contact guard assist for 50 feet with the least restrictive device.   5.  Patient will ascend/descend 3 stairs with B handrail(s) with minimal assistance/contact guard assist.    Physical Therapy Long Term Goals  Initiated 01/07/2022 and to be accomplished within 14-21 day(s) (01/28/2022)  1.  Patient will supine to sit, sit to supine, roll right, and roll left in bed with supervision/set-up.    2.  Patient will transfer from bed to chair and chair to bed with supervision/set-up using the least restrictive device.  3.  Patient will perform sit to stand with supervision/set-up.  4.  Patient will ambulate with supervision/set-up for 150 feet with the least restrictive device.   5.  Patient will ascend/descend 4 stairs with 1 handrail(s) with supervision/set-up      Outcome: Progressing      PHYSICAL THERAPY TREATMENT    Patient: [name] Short (66 y.o. female)  Date: 01/10/2022  Diagnosis: Acute CVA (cerebrovascular accident) [facility]) [I63.9]   Precautions: Cervical Spinal Precautions, Fall Risk Precautions  Chart, physical therapy assessment, plan of care and goals were reviewed.      Time in: 0800  Time out : 0840    Patient seen for: Patient Education, Therapeutic Exercises, Mobility Training, NMRE  Patient missed treatment time eating breakfast.  Will  re-attempt as able.    Time in: 1202  Time out : 1235    Patient seen for: Patient Education, Teacher, early years/pre, Development worker, community, Gait Training    Time in: 1455  Time out : 1515    Patient seen for: Patient Education, Teacher, early years/pre, Location manager, Mobility Training    Pain:  Pt pain was reported as 8/10 pre-treatment.  Pt pain was reported as 8/10 post-treatment.  Intervention: Pt reports sacral wound pain noting she spoke with nursing staff prior to PT's arrival and is awaiting pain medicine.  Assisted pt with repositioning.    Patient identified with name and DOB: Yes    SUBJECTIVE:      Pt appears lethargic at start of session noting, "things are turned around this morning," and indicating she woke up early with pain in her sacrum and wound vac dressing change.  Pt reports, "my legs aren't working this morning;I couldn't get up," indicating increased difficulty with mobility.    OBJECTIVE DATA SUMMARY:    Objective:   Education: Education Given To: Patient  Education Provided: Role of Therapy;Plan of Care;Precautions;Safety;Transfer Training;Mobility Training;Family Education;DME/Home Modifications;Fall Prevention Strategies;Home Exercise Program  Education Method: Demonstration;Verbal  Barriers to Learning: Cognition  Education Outcome: Verbalized understanding;Continued education needed;Demonstrated understanding  BED/MAT MOBILITY Daily Assessment    Rolling Right Moderate assistance (with increased time and maximal education and cuing)  Pt requires maximal cuing for initiation utilizing log roll technique.    Supine to Sit  Sit to Supine Maximum assistance (through right sidelying  to right side to maintain spinal precautions with increased time and maximal verbal cues) for supine to sit.    During final treatment session, pt was assisted back to bed with moderate assistance for B LE management from dependent position onto bed an maximal cuing for spinal precautions.      TRANSFERS Daily Assessment     Sit to Stand Minimal Assistance    Comments Pt requires increased time with pt demonstrating and reporting sacral pain as well as multiple attempts to initiate lift-off.  Once pt tolerates participation, she is able to perform sit to stand with minimal assistance for lift off and anterior weight shift as pt demonstrates increased B UE weight bearing with posterior lean.  Pt performs stand to sit with minimal assistance for slow controlled descent and maximal verbal cues for increased hip flexion for slow controlled descent with stand to sit transfers.      During final treatment session, pt participated in stand step transfers w/c to commode to her right and bedside commode to bed to her right requiring minimal assistance for balance and safe RW management as well as maximal cues for attention to IV line and wound vac line.        GAIT Daily Assessment    Gait Deviations Decreased step height;Decreased step length    Assistive device Rolling Walker    Ambulation assistance - surface  Minimal assistance  Level tile    Distance 120 Feet    Comments Pt requires minimal assistance for balance and weight shift with ambulation with RW with moderate verbal cues for forward gaze and safe RW management ambulating with wide BOS and decreased B foot clearance.    Ambulation-uneven surface  NT        BALANCE Daily Assessment    Posture Poor    Sitting - Static Good    Sitting - Dynamic Fair    Standing - Static Fair -    Standing - Dynamic Poor      WHEELCHAIR MOBILITY/MANAGEMENT Daily Assessment   Able to Propel 150 Feet   Assist Level Minimal assistance   Curbs/ramps assistance required  NT   Wheelchair management  Pt manages B brakes with use of brake extenders and minimal assistance   Comments Pt propels w/c with minimal assistance due to path deviations to left with maximal verbal cuing for ROM to reach back on rim of wheel with left UE for mor efficient propulsion.     THERAPEUTIC EXERCISES Daily Assessment     Supine  LE ROM/Strength Training:  3 Sets of 10 Repetitions  AROM Right and Left Heel Slides  Ankle Pumps      Neuro Re-Education:  Pt sat EOB to participate in NMRE and set up to eat breakfast EOB with maximal reminders for upright posture, B feet flat on floor and abdominal engagement.    ASSESSMENT:  Pt requires increased assistance for bed mobility during AM treatment session with pt reporting she did not sleep well after wound vac dressing change.  However, pt demonstrated improved quality and increased tolerance ambulating further distances with gait training during second treatment session.  Progression toward goals:  []       Improving appropriately and progressing toward goals  [x]       Improving slowly and progressing toward goals  []       Not making progress toward goals and plan of care will be adjusted      PLAN:  Patient continues to benefit from skilled intervention  to address the above impairments.  Continue treatment per established plan of care.  Discharge Recommendations:  Home with Home health PT;24 hour supervision or assist;Subacute/Skilled Nursing Facility  Further Equipment Recommendations for Discharge:        Rolling Walker             Estimated Discharge Date: 01/24/2022    Activity Tolerance:   Fair  Please refer to the flowsheet for vital signs taken during this treatment.    After treatment:   [x]  Patient left in no apparent distress sitting EOB eating breakfast.  [x]  Patient left in no apparent distress sitting up in chair after second session assisted by rehab tech to dining room to eat.  []  Patient left in no apparent distress in w/c mobilizing under own power  []  Patient left in no apparent distress dining area  []  Patient left in no apparent distress mobilizing under own power  [x]  Call bell left within reach  []  Nursing notified  []  Caregiver present  []  Bed alarm activated   []  Chair alarm activated      Rosario AdieLaura  Nivek Powley, PT, DPT  01/10/2022

## 2022-01-10 NOTE — Progress Notes (Signed)
1600 Pt. Awake sitting up in bed no change in assessment. Wound vac dressing intact.   1800 Pt. Sitting up in chair eating dinner family in room.

## 2022-01-10 NOTE — Plan of Care (Signed)
PHYSICAL THERAPY GROUP THERAPY TREATMENT    Patient: [name] Short (66 y.o. female)  Date: 01/10/2022  Diagnosis: Acute CVA (cerebrovascular accident) [facility]) [I63.9]  Precautions:        Time In: 1405  Time Out: 1430    Patient seen for: Group therapy session    Pain:  Pt pain was reported as no report pre-treatment.  Pt pain was reported as no report post-treatment.  Intervention: NA    Patient identified with name and DOB: yes     SUBJECTIVE:      Patient agreeable to group therapy session.    OBJECTIVE DATA SUMMARY:    Objective:     Seated balance  Activity   Sets   Reps   Time Spent   Comments   []  Beach ball toss/catch       []  Balloon tapping       [x]  Forward/multi-directional reaching at tabletop activity     Pt performed seated reaching to play table top game with emphasis on sitting fwd in w/c away from back to challenge posture and core strengthening.   []   Beach ball "soccer"          ASSESSMENT:  [x]       Patient participated fully in group therapy session and able to tolerate all activities  []       Patient able to participate in group therapy session with only minor modifications and/or extended rest breaks needed.   []       Patient not able to tolerate group therapy session.       PLAN:  Patient continues to benefit from skilled intervention to address functional impairments.  [x]    Patient appropriate to continue group therapy sessions to promote increased participation in skilled therapy interventions and to provide opportunities for increased social interaction.          After treatment:   []  Patient left in no apparent distress in bed  [x]  Patient left in no apparent distress sitting up in chair  []  Patient left in no apparent distress in w/c mobilizing under own power  []  Patient left in no apparent distress dining area  []  Patient left in no apparent distress mobilizing under own power  [x]  Call bell left within reach  []  Nursing notified  []  Caregiver present  []  Bed alarm activated   [x]  Chair  alarm activated      , PTA  01/10/2022

## 2022-01-11 LAB — POCT GLUCOSE
POC Glucose: 233 mg/dL — ABNORMAL HIGH (ref 70–110)
POC Glucose: 136 mg/dL — ABNORMAL HIGH (ref 70–110)
POC Glucose: 203 mg/dL — ABNORMAL HIGH (ref 70–110)
POC Glucose: 197 mg/dL — ABNORMAL HIGH (ref 70–110)

## 2022-01-11 MED ORDER — INSULIN LISPRO 100 UNIT/ML IJ SOLN
100 UNIT/ML | Freq: Three times a day (TID) | INTRAMUSCULAR | Status: AC
Start: 2022-01-11 — End: 2022-01-25
  Administered 2022-01-11 – 2022-01-24 (×38): 4 [IU] via SUBCUTANEOUS

## 2022-01-11 MED ORDER — HYDRALAZINE HCL 10 MG PO TABS
10 MG | Freq: Four times a day (QID) | ORAL | Status: AC | PRN
Start: 2022-01-11 — End: 2022-01-25

## 2022-01-11 MED FILL — ATORVASTATIN CALCIUM 40 MG PO TABS: 40 MG | ORAL | Qty: 2

## 2022-01-11 MED FILL — LANTUS 100 UNIT/ML SC SOLN: 100 UNIT/ML | SUBCUTANEOUS | Qty: 32

## 2022-01-11 MED FILL — PIPERACILLIN SOD-TAZOBACTAM SO 3.375 (3-0.375) G IV SOLR: 3.375 (3-0.375) g | INTRAVENOUS | Qty: 3375

## 2022-01-11 MED FILL — FUROSEMIDE 40 MG PO TABS: 40 MG | ORAL | Qty: 1

## 2022-01-11 MED FILL — VANCOMYCIN HCL 750 MG IV SOLR: 750 MG | INTRAVENOUS | Qty: 750

## 2022-01-11 MED FILL — THERAPEUTIC-M PO TABS: ORAL | Qty: 1

## 2022-01-11 MED FILL — HYDROCODONE-ACETAMINOPHEN 5-325 MG PO TABS: 5-325 MG | ORAL | Qty: 1

## 2022-01-11 MED FILL — MAGNESIUM OXIDE -MG SUPPLEMENT 400 (240 MG) MG PO TABS: 400 (240 Mg) MG | ORAL | Qty: 1

## 2022-01-11 MED FILL — ASPIRIN LOW DOSE 81 MG PO CHEW: 81 MG | ORAL | Qty: 1

## 2022-01-11 MED FILL — HUMALOG 100 UNIT/ML IJ SOLN: 100 UNIT/ML | INTRAMUSCULAR | Qty: 4

## 2022-01-11 MED FILL — LOVENOX 40 MG/0.4ML IJ SOSY: 40 MG/0.4ML | INTRAMUSCULAR | Qty: 0.4

## 2022-01-11 MED FILL — FERROUS SULFATE 325 (65 FE) MG PO TBEC: 325 (65 Fe) MG | ORAL | Qty: 1

## 2022-01-11 MED FILL — HUMALOG 100 UNIT/ML IJ SOLN: 100 UNIT/ML | INTRAMUSCULAR | Qty: 3

## 2022-01-11 NOTE — Plan of Care (Signed)
Problem: SLP Adult - Disturbed Thought Process  Goal: By Discharge: Demonstrates cognitive skills at highest level of function for planned discharge setting.   See evaluation for individualized goals.  Description: Long term goals (Initiated 01/07/22 to be accomplished by 01/21/2022)  Patient will:  1. Be oriented x 3 and recall events of the day, supervision.  2. Follow 3 part and complex commands, 90% accuracy.  3.  Listen to 3-4 sentence length paragraphs and respond to questions re: content with 90% accuracy.  4.  List 10-12 items in categories of increasing abstraction, supervision.  5.  Discuss similarities/differences given 2 items, 80-90% accuracy.    Short term goals (by 01/14/22)  Patient will:  1.  Be oriented x 3 and recall events of the day, min assist.  2.  Follow 3 part and complex commands, 70-60% accuracy.  3.  Listen to 3-4 sentence length paragraphs and respond to questions re: content with 70-80% accuracy.  4.  List 10-12 items in categories of increasing abstraction, min assist.  5.  Discuss similarities/differences given 2 items, 70-80% accuracy.      Note:   SPEECH-LANGUAGE TREATMENT    Patient: [name] Short (66 y.o. female)  Date: 01/11/2022  Diagnosis: Acute CVA (cerebrovascular accident) [facility]) [I63.9] Acute CVA (cerebrovascular accident) (HCC)      Precautions: Standard  PLOF: As per H&P     ASSESSMENT:  Mrs.Sassaman is engaged in tasks presented and making very good progress toward meeting her goals.  Progression toward goals:  [x]        Improving appropriately and progressing toward goals  []        Improving slowly and progressing toward goals  []        Not making progress toward goals and plan of care will be adjusted     PLAN:  Patient continues to benefit from skilled intervention to address the above impairments.  Continue treatment per established plan of care.     SUBJECTIVE:   Patient stated "I like Dr ".    OBJECTIVE:   Mental Status:  Mrs. Whan was awake and alert .  Treatment &  Interventions:  Neuro-Linguistics:  Orientation:  Independent  Recent memory: Independent  3 part instructions: 100% accuracy  Complex instructions: 100% accuracy  Divergent naming:   Fast foods:  Patient listed 9 place to get fast food.   Pizza toppings: 8 options named   Presidents: Patient named 9      Response & Tolerance to Activities:  Mrs. Rackham is alert and motivated in sessions.     PAIN:  Start of Tx: Patient did not report pain  End of Tx: Patient did not report pain     After treatment:   [x]        Patient left in no apparent distress sitting up in chair  []        Patient left in no apparent distress in bed  [x]        Call bell left within reach  [x]        Nursing notified  []        Caregiver present  []        Bed alarm activated      COMMUNICATION/EDUCATION:   []  Patient educated regarding compensatory speech/language/comprehension techniques provided via demonstration, verbalization and teach back of comprehension  [x]  Patient/family have participated as able in goal setting and plan of care.  [x]  Patient/family agree to work toward stated goals and plan of care.  []  Patient understands intent  and goals of therapy, neutral about participation.  []  Patient unable to participate in goal setting/plan of care secondary to cognition, hearing/vision deficits; education ongoing with interdisciplinary staff       , SLP

## 2022-01-11 NOTE — Progress Notes (Signed)
Chaplain conducted a Follow up consultation and Spiritual Assessment for [facility], who is a 65 y.o.,female.      The Chaplain provided the following Interventions:  Continued the relationship of care and support.   Listened empathically.  Offered prayer and assurance of continued prayer on patients behalf.   Chart reviewed.    The following outcomes were achieved:  Patient expressed gratitude for chaplain's visit.    Assessment:  There are no further spiritual or religious issues which require Spiritual Care Services interventions at this time.     Plan:  Chaplains will continue to follow and will provide pastoral care on an as needed/requested basis.  Chaplain recommends bedside caregivers page chaplain on duty if patient shows signs of acute spiritual or emotional distress.       Chaplain Eilene Ghazi  Spiritual Care   [phone]

## 2022-01-11 NOTE — Progress Notes (Signed)
[facility]  [address], [place], [place] [id number]     INPATIENT REHABILITATION  DAILY PROGRESS NOTE     Date: 01/11/2022    Name: [name] AppleHelena Arnell Age / Sex: 66 y.o. 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  Patient is sitting in a chair in no apparent distress.  Speech therapist is also at bedside.  Patient is in good spirits.    Objective:     Vital Signs:  Patient Vitals for the past 24 hrs:   BP Temp Temp src Pulse Resp SpO2   01/11/22 0800 (!) 174/65 99.8 F (37.7 C) Oral 86 18 96 %   01/10/22 2128 -- -- -- -- 16 --   01/10/22 2045 (!) 135/58 97.7 F (36.5 C) Oral 93 16 100 %   01/10/22 1613 133/68 97 F (36.1 C) Oral 74 18 100 %        Physical Examination:  General:  Awake, alert  Cardiovascular:  S1S2+, RRR  Pulmonary:  CTA b/l  GI:  Soft, BS+, NT, ND  Extremities:  + edema.  Patient is wearing graded compression stockings  Has sacral decub with wound VAC in place    Current Medications:  Current Facility-Administered Medications   Medication Dose Route Frequency    vancomycin (VANCOCIN) 750 mg in sodium chloride 0.9 % 250 mL IVPB (vial-mate)  750 mg IntraVENous Q12H    ferrous sulfate (FE TABS 325) EC tablet 325 mg  325 mg Oral Daily with breakfast    insulin glargine (LANTUS) injection vial 32 Units  32 Units SubCUTAneous Daily    ondansetron (ZOFRAN-ODT) disintegrating tablet 4 mg  4 mg SubLINGual Q8H PRN    insulin lispro (HUMALOG) injection vial 3 Units  3 Units SubCUTAneous TID WC    aspirin chewable tablet 81 mg  81 mg Oral Daily    atorvastatin (LIPITOR) tablet 80 mg  80 mg Oral Nightly    enoxaparin (LOVENOX) injection 40 mg  40 mg SubCUTAneous Daily    furosemide (LASIX) tablet 40 mg  40 mg Oral Daily     HYDROcodone-acetaminophen (NORCO) 5-325 MG per tablet 1 tablet  1 tablet Oral Q4H PRN    magnesium oxide (MAG-OX) tablet 400 mg  400 mg Oral Daily    therapeutic multivitamin-minerals 1 tablet  1 tablet Oral Daily    glucose chewable tablet 16 g  4 tablet Oral PRN    dextrose bolus 10% 125 mL  125 mL IntraVENous PRN    Or    dextrose bolus 10% 250 mL  250 mL IntraVENous PRN    glucagon (rDNA) injection 1 mg  1 mg SubCUTAneous PRN    dextrose 10 % infusion   IntraVENous Continuous PRN    acetaminophen (TYLENOL) tablet 650 mg  650 mg Oral Q4H PRN    polyethylene glycol (GLYCOLAX) packet 17 g  17 g Oral Daily PRN    insulin lispro (HUMALOG) injection vial 0-8 Units  0-8 Units SubCUTAneous TID WC    insulin lispro (HUMALOG) injection vial 0-4 Units  0-4 Units SubCUTAneous Nightly    piperacillin-tazobactam (ZOSYN) 3,375 mg in sodium chloride 0.9 % 50 mL IVPB (mini-bag)  3,375 mg IntraVENous Q8H       Allergies:  Allergies   Allergen Reactions    Lactose Other (See Comments)     Intolerance-Pt denies       Lab/Data Review:  Recent Results (from the past 24 hour(s))   POCT Glucose    Collection Time: 01/10/22  4:11 PM   Result Value Ref Range    POC Glucose 175 (H) 70 - 110 mg/dL   POCT Glucose    Collection Time: 01/10/22  8:52 PM   Result Value Ref Range    POC Glucose 233 (H) 70 - 110 mg/dL   POCT Glucose    Collection Time: 01/11/22  7:43 AM   Result Value Ref Range    POC Glucose 136 (H) 70 - 110 mg/dL         Assessment:     Primary Rehabilitation Diagnosis impaired mobility and ADLs in the setting of 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     Other Co-Morbid Conditions managed in Rehab   Cervical spondylosis with myelopathy, status post laminectomy and fusion of C3-C6 and external bone growth stimulator placement on Dec 09, 2021  Infected sacral decubitus ulcer-status post debridement  Type 2 diabetes with hyperglycemia  Hypertension  Hypomagnesemia  Anemia    PLAN  -impaired mobility and ADLs in the  setting of 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   Continue aspirin and high intensity statin.  Physical therapy, Occupational Therapy, speech therapy     -Cervical spondylosis with myelopathy, status post laminectomy and fusion of C3-C6 and external bone growth stimulator placement on Dec 09, 2021-PT and OT.  Judicious pain control.  Bowel regimen        -Infected sacral decubitus ulcer-status post debridement.  Continue wound care and IV antibiotics.  Routine PICC line care     -Type 2 diabetes with hyperglycemia.  Continue Lantus insulin and prandial insulin and sliding scale insulin.  Monitor sugars  On January 09, 2022 I increased Lantus from 30 units to 32 units daily, monitor.  Continue current regimen  On January 11, 2022 I have increased the prandial insulin from 3 units 3 times daily to 4 units 3 times daily with meals     -Hypertension-blood pressure is trending up.  Add as needed hydralazine        -Hypomagnesemia-continue to supplement p.o. magnesium    -Hypokalemia  On January 07, 2022 potassium was 3.0.  Repleted  On January 08, 2022 potassium is 3.7  On January 09, 2022 potassium was 3.6  On January 10, 2022 potassium is 3.6     -Anemia-continue ferrous sulfate, monitor  On January 07, 2022 hemoglobin is 8.3    -Hypercalcemia, primary hyperparathyroidism  On January 07, 2022 calcium was 10.6  On January 08, 2022 calcium is 11  On January 09, 2022 calcium is 10.9  On January 10, 2022 calcium is 10.5  Nephrology is following     Fall precautions, aspiration precautions  I discussed with patient.  I discussed with speech therapist    Functional Progress:    PHYSICAL THERAPY    ON ADMISSION MOST RECENT   Balance  Balance  Posture: Poor  Sitting - Static: Good  Sitting - Dynamic: Fair  Standing - Static: Poor  Standing - Dynamic: Poor                     Balance  Balance  Posture: Poor  Sitting - Static:  Good  Sitting - Dynamic: Fair  Standing - Static: Poor  Standing - Dynamic: Poor                       Bed Mobility  Maximum  assistance  Maximum assistance  Moderate assistance (Pt requird increased encouragement and time to transition to seated position.)  Moderate Assistance  Maximum assistance  Moderate assistance    Bed Mobility  Rolling to Right: Moderate assistance (with increased time and maximal education and cuing)  Rolling to Left: Maximum assistance  Supine to Sit: Maximum assistance (through right sidelying to right side to maintain spinal precautions with increased time and maximal verbal cues)  Sit to Stand: Minimal Assistance  Sit to Supine: Maximum assistance  Bed to Chair: Moderate assistance      Wheelchair Mobility  Yes       Wheelchair Mobility  Propulsion: Yes         Ambulation  Rolling Walker  Moderate assistance  Level tile  12 Ambulation  Device: Rolling Walker  Assistance: Minimal assistance  Surface: Level tile  Distance: 120 Feet   Stairs  No       Stairs  Stairs?: No           Functional Progress:    OCCUPATIONAL THERAPY    ON ADMISSION MOST RECENT   Eating  Supervision Eating  Feeding: Supervision   Grooming  Supervision, Setup (sitting EOB) Grooming  Grooming: Supervision   Bathing  UB Bathing Supervision, Setup (sitting EOB)  LB Bathing Maximum assistance Bathing  UB Bathing   UE Bathing: Supervision, Stand by assistance  LB Bathing   ZOXW([id number])@   Upper Body Dressing  Maximum assistance   Upper Body Dressing  UE Dressing: Supervision, Stand by assistance   Lower Body Dressing  Maximum assistance Lower Body Dressing  LE Dressing: Moderate assistance, Maximum assistance   Toileting  Maximum assistance Toileting  Toileting: Moderate assistance   Toilet Transfers  Moderate assistance Toilet Transfers  Toilet Transfer: Minimal assistance, Moderate assistance   Tub Transfers  Tub Transfers: Not tested  Engineer, manufacturing Transfers: Not tested Tub Transfers  Tub Transfers  Tub Transfers: Not tested  SHOWER Transfers  Tub Transfers  Tub Transfers: Not tested     Legend:   7 - Independent   6 -  Modified Independent   5 - Standby Assistance / Supervision / Set-up   4 - Minimum Assistance / Contact Guard Assistance   3 - Moderate Assistance   2 - Maximum Assistance   1 - Total Assistance / Dependent             Plan:     1. Justification for continued stay: Fair progression towards established rehabilitation goals.      2. Medical Issues being followed closely:      Fall and safety precautions       Wound Care       Bowel and Bladder Function       Fluid Electrolyte and Nutrition Balance       Pain Control      3. Issues that 24 hour rehabilitation nursing is following:      Fall and safety precautions       Wound Care       Bowel and Bladder Function       Fluid Electrolyte and Nutrition Balance       Pain Control        Assistance with  and education on in-room safety with transfers to and from the bed, wheelchair, toilet and shower.      4. Acute rehabilitation plan of care:    [x]   Continue current care and rehab.           [x]   Physical Therapy           [x]   Occupational Therapy           [x]   Speech Therapy     []   Hold Rehab until further notice     5. Medications:    [x]   MAR Reviewed     [x]   Continue Present Medications       6. Chemical DVT Prophylaxis:      [x]   Enoxaparin     []   Unfractionated Heparin     []   Warfarin     []   NOAC     []   Aspirin     []   None     7. Mechanical DVT Prophylaxis:      [x]   TED Stockings     []   Sequential Compression Device     []   None     8. GI Prophylaxis:      []   PPI     []   H2 Blocker     [x]   None / Not indicated     9. Code status:Full    Dragon medical dictation software was used for portions of this report. Unintended errors may occur.      Signed:    , MD      January 11, 2022

## 2022-01-11 NOTE — Plan of Care (Signed)
Problem: Occupational Therapy - Adult  Goal: By Discharge: Performs self-care activities at highest level of function for planned discharge setting.  See evaluation for individualized goals.  Description: Occupational Therapy Goals   Long Term Goals  Initiated 01/07/2022 and to be accomplished within 2-3 week(s) 01/28/2022  1. Pt will perform self-feeding with ModI.  2. Pt will perform grooming with S/Setup  3. Pt will perform UB bathing with S/Setup  4. Pt will perform LB bathing with Mina.  5. Pt will perform tub/shower transfer with [facility].  6. Pt will perform UB dressing with S/Setup  7. Pt will perform LB dressing with Mina.  8. Pt will perform toileting task with [facility].  9. Pt will perform toilet transfer with [facility].   10.  Pt will perform an IADL task with good safety and Mina.      Short Term Goals   Initiated 01/07/2022 and to be accomplished within 7 day(s) 01/14/2022  1. Pt will perform self-feeding with Setup.  2. Pt will perform grooming with Setup.  3. Pt will perform UB bathing with Setup.  4. Pt will perform LB bathing with Moda.  5. Pt will perform tub/shower transfer with  Moda.  6. Pt will perform UB dressing with Mina.  7. Pt will perform LB dressing with Moda.  8. Pt will perform toileting task with Moda.  9. Pt will perform toilet transfer with Moda.         Outcome: Progressing   Occupational Therapy TREATMENT    Patient: [name] Short   66 y.o.    Patient identified with name and DOB: yes    Date: 01/11/2022    First Tx Session  Time In: 0700  Time Out: 0830      Diagnosis: Acute CVA (cerebrovascular accident) [facility]) [I63.9]   Precautions:  Restrictions/Precautions: Fall Risk, Surgical Protocols (Cervical Spinal Precautions) Spinal Precautions: No Bending, No Lifting, No Twisting                           Chart, occupational therapy assessment, plan of care, and goals were reviewed.     Pain:  Pt reports 8/10 pain or discomfort prior to treatment.    Pt reports 8/10 pain or discomfort post treatment.    Intervention Provided: reported to nursing      SUBJECTIVE:   Patient stated "Its these muscle spasms."    OBJECTIVE DATA SUMMARY:   Overall Orientation Status: Within Functional Limits  Orientation Level: Oriented X4   Overall Cognitive Status: WFL   THERAPEUTIC ACTIVITY Daily Assessment    Sitting Balance: Supervision  Standing Balance: Minimal assistance              GROOMING Daily Assessment   Functional Level Supervision;Setup   Clinical factors  Pt performed oral care at EOB with supervision and setup.  Pt required increased time and multiple attempts to open mouthwash due to decreased hand strength.      UPPER BODY BATHING Daily Assessment   Functional Level Supervision   Clinical Factors Pt performed UB bathing via sponge bath at EOB with supervision with exception of washing sticky reside from leads of skin.     LOWER BODY BATHING Daily Assessment   Functional Level  Maximum assistance;Verbal cueing;Adaptive equipment   Clinical Factors Pt performed LB bathing via sponge bath with maxA in long sitting, supine and seated EOB. Pt required assistance to wash lower BLE legs thoroughly to wash dry skin off and  wash buttocks. Pt demonstrated ability to wash lower BLE legs and feet 50% to completion with long handled sponge and wash peri area with supervision. Pt required assistance to wash buttocks thoroughly in side lying and using log roll methods to maintain precautions.     UPPER BODY DRESSING/UNDRESSING Daily Assessment   Functional Level Supervision;Stand by assistance;Verbal cueing   Clinical Factors Pt performed UB dressing at EOB with supervision and setup and min VC's for sequencing and problem solving.     LOWER BODY DRESSING/UNDRESSING Daily Assessment   Functional Level Moderate assistance   Clinical Factors Pt performed LB dressing at EOB with sock aid to don BLE socks and supervision. Pt thread BLE feet through brief and pants using long handled reacher and pulled up over buttocks and BLE hips  with modA from bed level.     TOILETING Daily Assessment   Functional Level     Clinical factors Pt declined. Pt incontinent of bladder.     Activity Tolerance:  Patient limited by pain           EDUCATION:   Education Given To: Patient  Education Provided: Precautions;Energy Conservation;ADL Function;Safety;Fall Prevention Strategies  Education Method: Demonstration;Verbal  Barriers to Learning: Cognition  Education Outcome: Verbalized understanding;Continued education needed;Demonstrated understanding    ASSESSMENT:  Pt is making progress and improving work simplification techniques secondary to pain and impaired BUE strength and activity tolerance. Pt is improving safety with precautions however requires increased time and continues to demonstrate impaired BUE coordination and hand strength for self cares.   Progression toward goals:  []           Improving appropriately and progressing toward goals  [x]           Improving slowly and progressing toward goals  []           Not making progress toward goals and plan of care will be adjusted       PLAN:  Patient continues to benefit from skilled intervention to address the above impairments.  Continue treatment per established plan of care.  Discharge Recommendations:  Home Health with 24 hr assist vs. SNF  Further Equipment Recommendations for Discharge:  TBD dependent on progress  Estimated LOS:TBD     COMMUNICATION/EDUCATION:   []  Home safety education was provided and the patient/caregiver indicated understanding.  []  Patient/family have participated as able in goal setting and plan of care.  [x]  Patient/family agree to work toward stated goals and plan of care.  []  Patient understands intent and goals of therapy, but is neutral about his/her participation.  []  Patient is unable to participate in goal setting and plan of care.    Please refer to the flowsheet for vital signs taken during this treatment.  After treatment:   []   Patient left in no apparent distress  sitting up in chair   [x]   Patient left in no apparent distress in bed  [x]   Call bell and immediate needs left within reach  [x]   Nursing notified  []   Caregiver present  []   Bed alarm activated      Entered Differentiated Treatment minutes: yes    , OT  01/11/2022

## 2022-01-11 NOTE — Progress Notes (Addendum)
0800 Pt. Awake sitting up in bed no change in assessment.   0930 Pt sitting up in chair eating breakfast.   1200 with therapy.  1330 able to transfer from bed to chair with assist.   1500 no change in assessment. Wound vac dressing dry and intact.  1800 Pt. Sitting up in chair eating dinner.

## 2022-01-11 NOTE — Progress Notes (Signed)
Wound Prevention Checklist    Patient: [name] Short (66 y.o. female)  Date: 01/11/2022  Diagnosis: Acute CVA (cerebrovascular accident) [facility]) [I63.9] Acute CVA (cerebrovascular accident) (HCC)    Precautions:         []   Heel prevention boots placed on patient    []   Patient turned q2h during shift    []   Lift team ordered    []   Patient on [employer] bed/Specialty bed    []   Each Wound is documented during shift (Stage, Color, drainage, odor, measurements, and dressings)    []   Dual skin check done with Jada SN    , RN

## 2022-01-12 LAB — PTH, INTACT
Calcium: 10.3 mg/dL — ABNORMAL HIGH (ref 8.5–10.1)
Pth Intact: 97.2 pg/mL — ABNORMAL HIGH (ref 18.4–88.0)

## 2022-01-12 LAB — POCT GLUCOSE
POC Glucose: 224 mg/dL — ABNORMAL HIGH (ref 70–110)
POC Glucose: 117 mg/dL — ABNORMAL HIGH (ref 70–110)
POC Glucose: 121 mg/dL — ABNORMAL HIGH (ref 70–110)
POC Glucose: 131 mg/dL — ABNORMAL HIGH (ref 70–110)

## 2022-01-12 LAB — CALCIUM, IONIZED: Calcium, Ionized: 1.46 MMOL/L — ABNORMAL HIGH (ref 1.15–1.33)

## 2022-01-12 LAB — VANCOMYCIN LEVEL, RANDOM: Vancomycin Rm: 16.6 ug/mL (ref 5.0–40.0)

## 2022-01-12 MED FILL — HUMALOG 100 UNIT/ML IJ SOLN: 100 UNIT/ML | INTRAMUSCULAR | Qty: 4

## 2022-01-12 MED FILL — ATORVASTATIN CALCIUM 40 MG PO TABS: 40 MG | ORAL | Qty: 2

## 2022-01-12 MED FILL — PIPERACILLIN SOD-TAZOBACTAM SO 3.375 (3-0.375) G IV SOLR: 3.375 (3-0.375) g | INTRAVENOUS | Qty: 3375

## 2022-01-12 MED FILL — LOVENOX 40 MG/0.4ML IJ SOSY: 40 MG/0.4ML | INTRAMUSCULAR | Qty: 0.4

## 2022-01-12 MED FILL — VANCOMYCIN HCL 750 MG IV SOLR: 750 MG | INTRAVENOUS | Qty: 750

## 2022-01-12 MED FILL — FUROSEMIDE 40 MG PO TABS: 40 MG | ORAL | Qty: 1

## 2022-01-12 MED FILL — MAGNESIUM OXIDE -MG SUPPLEMENT 400 (240 MG) MG PO TABS: 400 (240 Mg) MG | ORAL | Qty: 1

## 2022-01-12 MED FILL — LANTUS 100 UNIT/ML SC SOLN: 100 UNIT/ML | SUBCUTANEOUS | Qty: 32

## 2022-01-12 MED FILL — THERAPEUTIC-M PO TABS: ORAL | Qty: 1

## 2022-01-12 MED FILL — HYDRALAZINE HCL 10 MG PO TABS: 10 MG | ORAL | Qty: 1

## 2022-01-12 MED FILL — ASPIRIN LOW DOSE 81 MG PO CHEW: 81 MG | ORAL | Qty: 1

## 2022-01-12 MED FILL — HYDROCODONE-ACETAMINOPHEN 5-325 MG PO TABS: 5-325 MG | ORAL | Qty: 1

## 2022-01-12 MED FILL — FERROUS SULFATE 325 (65 FE) MG PO TBEC: 325 (65 Fe) MG | ORAL | Qty: 1

## 2022-01-12 NOTE — Progress Notes (Signed)
Pharmacy Pharmacokinetic Monitoring Service - Vancomycin    Consulting Provider: Jonah Blue, MD   Indication:  infected sacral decub.   Target Concentration: Goal AUC/MIC 400-600 mg*hr/L  Day of Therapy (Rehab): 7 of 31 (through 02/06/22 as per ID recommendations).  Additional Antimicrobials: Piperacillin/Tazobactam    Pertinent Laboratory Values:   Temp: 97.7 F (36.5 C)  Recent Labs     01/10/22  0500   BUN 20*     Estimated Creatinine Clearance: 68 mL/min (based on SCr of 0.77 mg/dL).    Pertinent Cultures:  Culture Date Source Results   05/27 Blood x 2 NG   05/27 Wound, Sacral Proteus mirabilis (sens to Zosyn)  Enterococcus faecalis (sens to Vanc)     MRSA Nasal Swab: N/A. Non-respiratory infection    Assessment:  Date/Time Current Dose Concentration Timing of Concentration (h) AUC   6/05 1000 mg q12h 17.4 9.2h 415   6/06 1000 mg q12h - - -   06/07 " "      06/08 " "      06/09 " " 27.5 6 634   06/10 2200 750 mg q12h - - -   06/11 - 16.6 7 468   Note: Serum concentrations collected for AUC dosing may appear elevated if collected in close proximity to the dose administered, this is not necessarily an indication of toxicity    Plan:  Continue Vancomycin 750 mg IV q12h  Renal labs as indicated. Ordered for 06/16  Vancomycin level ordered for 06/16 with AM labs  Pharmacy will continue to monitor patient and adjust therapy as indicated

## 2022-01-13 ENCOUNTER — Encounter: Payer: Self-pay | Admitting: Cardiology

## 2022-01-13 LAB — POCT GLUCOSE
POC Glucose: 149 mg/dL — ABNORMAL HIGH (ref 70–110)
POC Glucose: 56 mg/dL — ABNORMAL LOW (ref 70–110)
POC Glucose: 60 mg/dL — ABNORMAL LOW (ref 70–110)
POC Glucose: 71 mg/dL (ref 70–110)
POC Glucose: 90 mg/dL (ref 70–110)
POC Glucose: 160 mg/dL — ABNORMAL HIGH (ref 70–110)
POC Glucose: 268 mg/dL — ABNORMAL HIGH (ref 70–110)

## 2022-01-13 MED FILL — MAGNESIUM OXIDE -MG SUPPLEMENT 400 (240 MG) MG PO TABS: 400 (240 Mg) MG | ORAL | Qty: 1 | Fill #0

## 2022-01-13 MED FILL — FERROUS SULFATE 325 (65 FE) MG PO TBEC: 325 (65 Fe) MG | ORAL | Qty: 1

## 2022-01-13 MED FILL — HYDROCODONE-ACETAMINOPHEN 5-325 MG PO TABS: 5-325 MG | ORAL | Qty: 1

## 2022-01-13 MED FILL — PIPERACILLIN SOD-TAZOBACTAM SO 3.375 (3-0.375) G IV SOLR: 3.375 (3-0.375) g | INTRAVENOUS | Qty: 3375

## 2022-01-13 MED FILL — PIPERACILLIN SOD-TAZOBACTAM SO 3.375 (3-0.375) G IV SOLR: 3.375 (3-0.375) g | INTRAVENOUS | Qty: 3375 | Fill #0

## 2022-01-13 MED FILL — VANCOMYCIN HCL 750 MG IV SOLR: 750 MG | INTRAVENOUS | Qty: 750

## 2022-01-13 MED FILL — LANTUS 100 UNIT/ML SC SOLN: 100 UNIT/ML | SUBCUTANEOUS | Qty: 32

## 2022-01-13 MED FILL — ASPIRIN LOW DOSE 81 MG PO CHEW: 81 MG | ORAL | Qty: 1

## 2022-01-13 MED FILL — LOVENOX 40 MG/0.4ML IJ SOSY: 40 MG/0.4ML | INTRAMUSCULAR | Qty: 0.4

## 2022-01-13 MED FILL — FUROSEMIDE 40 MG PO TABS: 40 MG | ORAL | Qty: 1 | Fill #0

## 2022-01-13 MED FILL — ATORVASTATIN CALCIUM 40 MG PO TABS: 40 MG | ORAL | Qty: 2

## 2022-01-13 MED FILL — VANCOMYCIN HCL 750 MG IV SOLR: 750 MG | INTRAVENOUS | Qty: 750 | Fill #0

## 2022-01-13 MED FILL — THERAPEUTIC-M PO TABS: ORAL | Qty: 1 | Fill #0

## 2022-01-13 NOTE — Plan of Care (Signed)
Problem: Occupational Therapy - Adult  Goal: By Discharge: Performs self-care activities at highest level of function for planned discharge setting.  See evaluation for individualized goals.  Description: Occupational Therapy Goals   Long Term Goals  Initiated 01/07/2022 and to be accomplished within 2-3 week(s) 01/28/2022  1. Pt will perform self-feeding with ModI.  2. Pt will perform grooming with S/Setup  3. Pt will perform UB bathing with S/Setup  4. Pt will perform LB bathing with Mina.  5. Pt will perform tub/shower transfer with [facility].  6. Pt will perform UB dressing with S/Setup  7. Pt will perform LB dressing with Mina.  8. Pt will perform toileting task with [facility].  9. Pt will perform toilet transfer with [facility].   10.  Pt will perform an IADL task with good safety and Mina.      Short Term Goals   Initiated 01/07/2022 and to be accomplished within 7 day(s) 01/14/2022  1. Pt will perform self-feeding with Setup.  2. Pt will perform grooming with Setup.  3. Pt will perform UB bathing with Setup.  4. Pt will perform LB bathing with Moda.  5. Pt will perform tub/shower transfer with  Moda.  6. Pt will perform UB dressing with Mina.  7. Pt will perform LB dressing with Moda.  8. Pt will perform toileting task with Moda.  9. Pt will perform toilet transfer with Moda.       Occupational Therapy TREATMENT    Patient: [name] Short   66 y.o.    Patient identified with name and DOB: Yes     Date: 01/13/2022    First Tx Session  Time In:  1200  Time Out:  1230    Second Tx Session  Time In: 1330  Time Out: 1430    Diagnosis: Acute CVA (cerebrovascular accident) [facility]) [I63.9]   Precautions: Restrictions/Precautions: Fall Risk, Surgical Protocols (Cervical Spinal Precautions)  Spinal Precautions: No Bending, No Lifting, No Twisting      Chart, occupational therapy assessment, plan of care, and goals were reviewed.     Pain:  Pt reports 8/10 pain or discomfort prior to treatment.    Pt reports 8/10 pain or discomfort post  treatment.   Intervention Provided: N/A      SUBJECTIVE:   Patient stated "I am waiting for the Nurse."  Pt reported wound vac leakage and awaiting Wound Nurse.    Nurse  OBJECTIVE DATA SUMMARY:   Sitting Balance:    Standing Balance:        THERAPEUTIC EXERCISE Daily Assessment    Instruct pt. in home exercise program: UB HEP (bicep curls, chess press,  butterfly wings, front raise,upright rows, 3x10 with 3lb weight.  Pt perform task seated EOB required moderate vcs to maintain upright posture and spinal cervical precautions.     TOILETING    Functional Level  Pt pulled brief down/up  with Moda, thread BLE through brief seated on bedside commode with SBA, and performed CM with Moda.   OT manage all medical cords.  Pt performed peri hygiene with setup requiring increased time and vcs to attend to task. Pt unable to perform buttock hygiene 2/2 to wound vac placement.    Clinical factors       TOILET TRANSFER  Daily Assessment   Transfer score  Pt performed  transfer from EOB to with Mina-Moda requiring total asst to manage IV, wound vac cord, and stabilize bedside commode   Comments  FUNCTIONAL MOBILITY Daily Assessment    Supine to EOB with physical/vcs to use log roll 2/2 cervical spinal precautions/  Pt required asst Mina at BLE and shoulders     ASSESSMENT:  Pt making no progress 2/2 to incontinence and wound vac placement impacting I with ADLs.  Pt shows a plateau with progress.  Pt daughter present during second session.  Progression toward goals:  []           Improving appropriately and progressing toward goals  []           Improving slowly and progressing toward goals  [x]           Not making progress toward goals and plan of care will be adjusted     PLAN:  Patient continues to benefit from skilled intervention to address the above impairments.  Continue treatment per established plan of care.  Discharge Recommendations:  Home Health vs SNF  Further Equipment Recommendations for Discharge:  N/A      Activity Tolerance:    Fair     EDUCATION:    , PT/CG daughter discharge plans requiring 24 asst.    COMMUNICATION:   []  Home safety education was provided and the patient/caregiver indicated understanding.  [x]  Patient/family have participated as able in goal setting and plan of care.  []  Patient/family agree to work toward stated goals and plan of care.  []  Patient understands intent and goals of therapy, but is neutral about his/her participation.  []  Patient is unable to participate in goal setting and plan of care.    Please refer to the flowsheet for vital signs taken during this treatment.  After treatment:   []   Patient left in no apparent distress sitting up in chair   [x]   Patient left in no apparent distress in bed  []   Call bell left within reach  []   Nursing notified  []   Caregiver present  []   Bed alarm activated    Estimated LOS:2-3 weeks  IRF-PAI Completed: N/A  Entered Differentiated Treatment minutes: N/A    , OT   Outcome: Progressing

## 2022-01-13 NOTE — Plan of Care (Signed)
Problem: Physical Therapy - Adult  Goal: By Discharge: Performs mobility at highest level of function for planned discharge setting.  See evaluation for individualized goals.  Description: Physical Therapy Short Term Goals  Initiated 01/07/2022 and to be accomplished within 7 day(s) (01/14/2022)  1.  Patient will supine to sit, sit to supine, roll right, and roll left  in bed with moderate assistance .    2.  Patient will transfer from bed to chair and chair to bed with minimal assistance/contact guard assist using the least restrictive device.  3.  Patient will perform sit to stand with minimal assistance/contact guard assist  4.  Patient will ambulate with minimal assistance/contact guard assist for 50 feet with the least restrictive device.   5.  Patient will ascend/descend 3 stairs with B handrail(s) with minimal assistance/contact guard assist.    Physical Therapy Long Term Goals  Initiated 01/07/2022 and to be accomplished within 14-21 day(s) (01/28/2022)  1.  Patient will supine to sit, sit to supine, roll right, and roll left in bed with supervision/set-up.    2.  Patient will transfer from bed to chair and chair to bed with supervision/set-up using the least restrictive device.  3.  Patient will perform sit to stand with supervision/set-up.  4.  Patient will ambulate with supervision/set-up for 150 feet with the least restrictive device.   5.  Patient will ascend/descend 4 stairs with 1 handrail(s) with supervision/set-up      Outcome: Progressing      PHYSICAL THERAPY TREATMENT    Patient: [name] Short (65 y.o. female)  Date: 01/13/2022  Diagnosis: Acute CVA (cerebrovascular accident) [facility]) [I63.9]   Precautions: Fall Risk Precautions, Cervical Spinal Precautions  Chart, physical therapy assessment, plan of care and goals were reviewed.    Time in: 0930  Time out : 1032    Patient seen for: Patient Education, Therapeutic Exercise, NMRE    Attempt at 1100 - pt's nursing staff caring for pt providing wound vac  dressing change due to leak.    Attempt at 1450 - pt completed OT treatment session at 1430, medical director was then speaking with pt and family.  When PT attempted to recover treatment, pt was in high fowlers in bed eating food brought by her daughter while her daughter was present in the room on the phone on behalf of her mother.    Attempt at 1510 - pt with wound care nurse addressing wound vac care.  Pt missed mobility interventions today due to wound care needs.    Pain:  Pt pain was reported as 7/10 pre-treatment.  Pt pain was reported as 7/10 post-treatment.  Intervention: Pt's nursing staff notified.  Pt with c/o pain, "everywhere," re: LE, sacrum, back    Patient identified with name and DOB: Yes    SUBJECTIVE:      Pt presents seated EOB after eating breakfast at start of treatment session noting, "it hasn't been a good morning."  Pt indicates her blood sugar has been low and has been assessed multiple times this morning.  The pt also notes, "my wound vac dressing has a leak," indicating the reason she is sitting EOB in a brief and does not wish to don pants at this time.  Pt's glucose per chart at this time is WNL and PT spoke with pt's nurse to clarify that pt is able to participate in skilled intervention at this time.  PT educated pt re: plan of care to participate in exercises and mobility within  pt's room until nursing staff can address pt's wound vac dressing.  Pt is agreeable but reports multiple c/o feeling dizzy.  Pt's nursing staff notified and aware.  Pt had eaten a complete breakfast and her BP was 140/70 mmHg when PT offered to assist pt to bedside commode and pt declined.    OBJECTIVE DATA SUMMARY:    Objective:   Education:      TRANSFERS Daily Assessment    Comments Attempted to initiate - pt requires increased time during first session and declines assistance to commode until end of scheduled treatment with hand-off given to nursing staff.      GAIT Daily Assessment    Comments Pt did  not participate in gait training this session due to wound vac care needs.      BALANCE Daily Assessment    Posture Poor    Sitting - Static Good    Sitting - Dynamic Fair    Comments Pt did not participate in standing during today's treatment session.        THERAPEUTIC EXERCISES Daily Assessment     Seated EOB:  3 Sets of 10 Repetitions:  Right and Left LAQ  Right and Left Hip Flexion      Neuro Re-Education:  Pt sat EOB x 60 minutes during treatment session without posterior support requiring frequent verbal cues and intermittent tactile cuing for neutral spinal posture and forward gaze as pt frequently tilts her head and demonstrates rounded shoulder posture with with posterior lean and PPT.    ASSESSMENT:  Pt limited today by c/o feeling dizzy, requiring increased time and maximal encouragement to attempt to engage in functional transfer and need for nursing care to address wound.  Pt missed treatment time due to nursing care needs.  Will re-attempt as able.  Progression toward goals:  []       Improving appropriately and progressing toward goals  [x]       Improving slowly and progressing toward goals  []       Not making progress toward goals and plan of care will be adjusted      PLAN:  Patient continues to benefit from skilled intervention to address the above impairments.  Continue treatment per established plan of care.  Discharge Recommendations:  Home with Home health PT;24 hour supervision or assist;Subacute/Skilled Nursing Facility  Further Equipment Recommendations for Discharge:        Rolling Walker             Estimated Discharge Date: 01/24/2022    Activity Tolerance:   Poor - limited by pain, c/o feeling dizzy  Please refer to the flowsheet for vital signs taken during this treatment.    After treatment:   [x]  Patient left in no apparent distress sitting EOB with nursing staff notified and aware  []  Patient left in no apparent distress sitting up in chair  []  Patient left in no apparent distress in  w/c mobilizing under own power  []  Patient left in no apparent distress dining area  []  Patient left in no apparent distress mobilizing under own power  [x]  Call bell left within reach  [x]  Nursing notified  []  Caregiver present  []  Bed alarm activated   []  Chair alarm activated      , PT, DPT  01/13/2022

## 2022-01-13 NOTE — Progress Notes (Signed)
Calcium level stable , slightly high   Primary hyperpara   Is following with endocrinology outpatient  Will sign off     Please call with questions    Sabino Gasser, MD FASN  Cell PM:[phone]  Pager: [phone]  Fax   [phone]

## 2022-01-13 NOTE — Progress Notes (Signed)
[facility]  [address] [place], [place] [address]     INPATIENT REHABILITATION  DAILY PROGRESS NOTE     Date: 01/13/2022    Name: [name] Short Age / Sex: 66 y.o. 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  Patient is laying in bed in no apparent distress, awake and alert.  Daughter is at bedside    Objective:     Vital Signs:  Patient Vitals for the past 24 hrs:   BP Temp Temp src Pulse Resp SpO2   01/13/22 1621 (!) 155/72 98.9 F (37.2 C) Oral 87 16 100 %   01/13/22 0815 (!) 164/82 98.8 F (37.1 C) Oral 92 16 98 %   01/12/22 2130 (!) 148/72 98.2 F (36.8 C) Oral 83 18 --        Physical Examination:  General:  Awake, alert  Cardiovascular:  S1S2+, RRR  Pulmonary:  CTA b/l  GI:  Soft, BS+, NT, ND  Extremities:  + edema  Sacral decub, wound VAC in place    Current Medications:  Current Facility-Administered Medications   Medication Dose Route Frequency    insulin lispro (HUMALOG) injection vial 4 Units  4 Units SubCUTAneous TID WC    hydrALAZINE (APRESOLINE) tablet 10 mg  10 mg Oral Q6H PRN    vancomycin (VANCOCIN) 750 mg in sodium chloride 0.9 % 250 mL IVPB (vial-mate)  750 mg IntraVENous Q12H    ferrous sulfate (FE TABS 325) EC tablet 325 mg  325 mg Oral Daily with breakfast    insulin glargine (LANTUS) injection vial 32 Units  32 Units SubCUTAneous Daily    ondansetron (ZOFRAN-ODT) disintegrating tablet 4 mg  4 mg SubLINGual Q8H PRN    aspirin chewable tablet 81 mg  81 mg Oral Daily    atorvastatin (LIPITOR) tablet 80 mg  80 mg Oral Nightly    enoxaparin (LOVENOX) injection 40 mg  40 mg SubCUTAneous Daily    furosemide (LASIX) tablet 40 mg  40 mg Oral Daily    HYDROcodone-acetaminophen (NORCO) 5-325 MG per tablet 1  tablet  1 tablet Oral Q4H PRN    therapeutic multivitamin-minerals 1 tablet  1 tablet Oral Daily    glucose chewable tablet 16 g  4 tablet Oral PRN    dextrose bolus 10% 125 mL  125 mL IntraVENous PRN    Or    dextrose bolus 10% 250 mL  250 mL IntraVENous PRN    glucagon (rDNA) injection 1 mg  1 mg SubCUTAneous PRN    dextrose 10 % infusion   IntraVENous Continuous PRN    acetaminophen (TYLENOL) tablet 650 mg  650 mg Oral Q4H PRN    polyethylene glycol (GLYCOLAX) packet 17 g  17 g Oral Daily PRN    insulin lispro (HUMALOG) injection vial 0-8 Units  0-8 Units SubCUTAneous TID WC    insulin lispro (HUMALOG) injection vial 0-4 Units  0-4 Units SubCUTAneous Nightly    piperacillin-tazobactam (ZOSYN) 3,375 mg in sodium chloride 0.9 % 50 mL IVPB (mini-bag)  3,375 mg IntraVENous Q8H       Allergies:  Allergies   Allergen Reactions    Lactose Other (See Comments)  Intolerance-Pt denies       Lab/Data Review:  Recent Results (from the past 24 hour(s))   POCT Glucose    Collection Time: 01/12/22  9:23 PM   Result Value Ref Range    POC Glucose 149 (H) 70 - 110 mg/dL   POCT Glucose    Collection Time: 01/13/22  8:32 AM   Result Value Ref Range    POC Glucose 60 (L) 70 - 110 mg/dL   POCT Glucose    Collection Time: 01/13/22  8:51 AM   Result Value Ref Range    POC Glucose 56 (L) 70 - 110 mg/dL   POCT Glucose    Collection Time: 01/13/22  9:05 AM   Result Value Ref Range    POC Glucose 71 70 - 110 mg/dL   POCT Glucose    Collection Time: 01/13/22  9:22 AM   Result Value Ref Range    POC Glucose 90 70 - 110 mg/dL   POCT Glucose    Collection Time: 01/13/22 12:09 PM   Result Value Ref Range    POC Glucose 160 (H) 70 - 110 mg/dL   POCT Glucose    Collection Time: 01/13/22  4:54 PM   Result Value Ref Range    POC Glucose 268 (H) 70 - 110 mg/dL         Assessment:     Primary Rehabilitation Diagnosis impaired mobility and ADLs in the setting of 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     Other Co-Morbid Conditions managed  in Rehab   Cervical spondylosis with myelopathy, status post laminectomy and fusion of C3-C6 and external bone growth stimulator placement on Dec 09, 2021  Infected sacral decubitus ulcer-status post debridement  Type 2 diabetes with hyperglycemia  Hypertension  Hypomagnesemia  Anemia    PLAN  -impaired mobility and ADLs in the setting of 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   Aspirin and statin  Physical therapy, Occupational Therapy, speech therapy     -Cervical spondylosis with myelopathy, status post laminectomy and fusion of C3-C6 and external bone growth stimulator placement on Dec 09, 2021-PT and OT.  Judicious pain control.  Bowel regimen        -Infected sacral decubitus ulcer-status post debridement.  Continue wound care and IV antibiotics.  Routine PICC line care.  Wound care was reconsulted and saw the patient earlier today.  Nurse manager states that the wound care nurse is recommending to continue the p.o. awake at this time.     -Type 2 diabetes with hyperglycemia.  Continue Lantus insulin and prandial insulin and sliding scale insulin.  Monitor sugars  On January 09, 2022 I increased Lantus from 30 units to 32 units daily, monitor.  Continue current regimen  On January 11, 2022 I have increased the prandial insulin from 3 units 3 times daily to 4 units 3 times daily with meals  Some low sugars noted this morning.  Will decrease Lantus back to 30 units subcu daily.  Continue to monitor     -Hypertension-blood pressure is trending up.  Add as needed hydralazine  Blood pressure is trending high.  On January 13, 2022 I have added amlodipine 2.5 mg p.o. daily.  Continue to monitor        -Hypomagnesemia-continue to supplement p.o. magnesium    -Hypokalemia  On January 07, 2022 potassium was 3.0.  Repleted  On January 08, 2022 potassium is 3.7  On January 09, 2022 potassium was 3.6  On January 10, 2022 potassium is 3.6     -Anemia-continue ferrous sulfate, monitor  On January 07, 2022 hemoglobin is 8.3    -Hypercalcemia,  primary hyperparathyroidism  On January 07, 2022 calcium was 10.6  On January 08, 2022 calcium is 34  On January 09, 2022 calcium is 10.9  On January 10, 2022 calcium is 10.5  Patient follows with endocrinologist as an outpatient     Fall precautions, aspiration precautions  I discussed with patient and daughter at bedside.  They are agreeable to transitioning to SNF after inpatient rehab.  I have discussed with the social worker    Functional Progress:    PHYSICAL THERAPY    ON ADMISSION MOST RECENT   Balance  Balance  Posture: Poor  Sitting - Static: Good  Sitting - Dynamic: Fair  Standing - Static: Poor  Standing - Dynamic: Poor  Poor  Good  Fair            Balance  Balance  Posture: Poor  Sitting - Static: Good  Sitting - Dynamic: Fair  Standing - Static: Poor  Standing - Dynamic: Poor  Posture: Poor  Sitting - Static: Good  Sitting - Dynamic: Fair              Bed Mobility  Maximum assistance  Maximum assistance  Moderate assistance (Pt requird increased encouragement and time to transition to seated position.)  Moderate Assistance  Maximum assistance  Moderate assistance    Bed Mobility  Rolling to Right: Moderate assistance (with increased time and maximal education and cuing)  Rolling to Left: Maximum assistance  Supine to Sit: Maximum assistance (through right sidelying to right side to maintain spinal precautions with increased time and maximal verbal cues)  Sit to Stand: Minimal Assistance  Sit to Supine: Maximum assistance  Bed to Chair: Moderate assistance      Wheelchair Mobility  Yes       Wheelchair Mobility  Propulsion: Yes         Ambulation  Rolling Walker  Moderate assistance  Level tile  12 Ambulation  Device: Rolling Walker  Assistance: Minimal assistance  Surface: Level tile  Distance: 120 Feet   Stairs  No       Stairs  Stairs?: No           Functional Progress:    OCCUPATIONAL THERAPY    ON ADMISSION MOST RECENT   Eating  Supervision Eating  Feeding: Supervision   Grooming  Supervision, Setup (sitting EOB)  Grooming  Grooming: Supervision, Setup   Bathing  UB Bathing Supervision, Setup (sitting EOB)  LB Bathing Maximum assistance Bathing  UB Bathing   UE Bathing: Supervision  LB Bathing   YNWG([id number])@   Upper Body Dressing  Maximum assistance   Upper Body Dressing  UE Dressing: Supervision, Stand by assistance, Verbal cueing   Lower Body Dressing  Maximum assistance Lower Body Dressing  LE Dressing: Moderate assistance   Toileting  Maximum assistance Toileting  Toileting: Moderate assistance   Toilet Transfers  Moderate assistance Toilet Transfers  Toilet Transfer: Minimal assistance, Moderate assistance   Tub Transfers  Tub Transfers: Not tested  [place] Transfers: Not tested Tub Transfers  Tub Transfers  Tub Transfers: Not tested  SHOWER Transfers  Tub Transfers  Tub Transfers: Not tested     Legend:   7 - Independent   6 - Modified Independent   5 - Standby Assistance / Supervision / Set-up   4 - Minimum  Assistance / Advertising account executiveContact Guard Assistance   3 - Moderate Assistance   2 - Maximum Assistance   1 - Total Assistance / Dependent             Plan:     1. Justification for continued stay: Fair progression towards established rehabilitation goals.  Patient is now able to do toileting with moderate assistance in comparison to requiring maximum assistance with toileting at the time of admission.    2. Medical Issues being followed closely:    [x]   Fall and safety precautions     [x]   Wound Care     [x]   Bowel and Bladder Function     [x]   Fluid Electrolyte and Nutrition Balance     [x]   Pain Control      3. Issues that 24 hour rehabilitation nursing is following:    [x]   Fall and safety precautions     [x]   Wound Care     [x]   Bowel and Bladder Function     [x]   Fluid Electrolyte and Nutrition Balance     [x]   Pain Control      [x]   Assistance with and education on in-room safety with transfers to and from the bed, wheelchair, toilet and shower.      4. Acute rehabilitation plan of care:    [x]    Continue current care and rehab.           [x]   Physical Therapy           [x]   Occupational Therapy           [x]   Speech Therapy     []   Hold Rehab until further notice     5. Medications:    [x]   MAR Reviewed     [x]   Continue Present Medications       6. Chemical DVT Prophylaxis:      [x]   Enoxaparin     []   Unfractionated Heparin     []   Warfarin     []   NOAC     []   Aspirin     []   None     7. Mechanical DVT Prophylaxis:      [x]   TED Stockings     []   Sequential Compression Device     []   None     8. GI Prophylaxis:      []   PPI     []   H2 Blocker     [x]   None / Not indicated     9. Code status:Full    Dragon medical dictation software was used for portions of this report. Unintended errors may occur.      Signed:    Jonah BlueVikas Sabre Leonetti, MD      January 13, 2022

## 2022-01-13 NOTE — Wound Image (Signed)
Unit staff requested assistance with wound vac troubleshooting. Old wet drapes cut away from peri-rectal area.  Skin prep and hydrocolloid applied peri-wound and peri-rectum.  New drapes applied.  Vac seal obtained.  Machine quiet, settings continuous.   Spoke with Associate Professor M. Robb Matar RN, wound needs measurements and at her last assessment she felt wound was not improving.  Recommend holding vac dressing til Wednesday during the daytime when the wound care team can be available to assist with the dressing change. Also recommend using Purewick for urinary diversion while in bed. Patient is incontinent of urine and feces.     Reubin Milan BSN, RN, [facility], [facility]  [facility] Wound Care Dept.  Office: [phone]  Work Cell: [phone]

## 2022-01-13 NOTE — Progress Notes (Signed)
Latest Reference Range & Units 01/13/22 08:32 01/13/22 08:51 01/13/22 09:05 01/13/22 09:22   POC Glucose 70 - 110 mg/dL 60 (L) 56 (L) 71 90   (L): Data is abnormally low    0832- Patient's blood glucose was checked and noted to be 60. Patient is asymptomatic and was assisted to the bathroom. She was then given some crackers with peanut butter to eat. Patient sitting up on side of bed eating. No distress noted at this time.Will recheck blood glucose in 15 minuets per hospital policy.    1610- Blood sugar rechecked and noted to be 56. Patient was given her breakfast tray and encouraged to eat. Juice was offered however she refused at this time. Will recheck blood glucose in 15 minuets per hospital policy.    9604- Blood glucose rechecked and noted to be 71. Patient continued eating breakfast and drank 120 ml of juice. Patient sitting up at EOB with no distress noted. Will recheck blood sugar in 15 minuets.     5409- Blood sugar rechecked and noted to be 90. Patient ate 100% of her morning breakfast. Romeo Apple, NP was notified of the patient's low blood glucose this morning. Humalog and Lantus insulin was held this morning per NP. Patient is still sitting up on the EOB with no distress noted.

## 2022-01-13 NOTE — Plan of Care (Signed)
Problem: Chronic Conditions and Co-morbidities  Goal: Patient's chronic conditions and co-morbidity symptoms are monitored and maintained or improved  Outcome: Progressing     Problem: Safety - Adult  Goal: Free from fall injury  Outcome: Progressing     Problem: Skin/Tissue Integrity  Goal: Absence of new skin breakdown  Description: 1.  Monitor for areas of redness and/or skin breakdown  2.  Assess vascular access sites hourly  3.  Every 4-6 hours minimum:  Change oxygen saturation probe site  4.  Every 4-6 hours:  If on nasal continuous positive airway pressure, respiratory therapy assess nares and determine need for appliance change or resting period.  Outcome: Progressing     Problem: ABCDS Injury Assessment  Goal: Absence of physical injury  Outcome: Progressing     Problem: Pain  Goal: Verbalizes/displays adequate comfort level or baseline comfort level  Outcome: Progressing

## 2022-01-13 NOTE — Plan of Care (Signed)
Problem: SLP Adult - Disturbed Thought Process  Goal: By Discharge: Demonstrates cognitive skills at highest level of function for planned discharge setting.   See evaluation for individualized goals.  Description: Long term goals (Initiated 01/07/22 to be accomplished by 01/21/2022)  Patient will:  1. Be oriented x 3 and recall events of the day, supervision.  2. Follow 3 part and complex commands, 90% accuracy.  3.  Listen to 3-4 sentence length paragraphs and respond to questions re: content with 90% accuracy.  4.  List 10-12 items in categories of increasing abstraction, supervision.  5.  Discuss similarities/differences given 2 items, 80-90% accuracy.    Short term goals (by 01/14/22)  Patient will:  1.  Be oriented x 3 and recall events of the day, min assist.  2.  Follow 3 part and complex commands, 70-60% accuracy.  3.  Listen to 3-4 sentence length paragraphs and respond to questions re: content with 70-80% accuracy.  4.  List 10-12 items in categories of increasing abstraction, min assist.  5.  Discuss similarities/differences given 2 items, 70-80% accuracy.      Note:   SPEECH-LANGUAGE TREATMENT    Patient: [name] Short (66 y.o. female)  Date: 01/13/2022  Diagnosis: Acute CVA (cerebrovascular accident) [facility]) [I63.9] Acute CVA (cerebrovascular accident) (HCC)      Precautions: Standard  PLOF: As per H&P     ASSESSMENT:  Patient was pleasant and participated in all tasks.  Progression toward goals:  [x]        Improving appropriately and progressing toward goals  []        Improving slowly and progressing toward goals  []        Not making progress toward goals and plan of care will be adjusted     PLAN:  Patient continues to benefit from skilled intervention to address the above impairments.  Continue treatment per established plan of care.     SUBJECTIVE:   Patient stated "I didn't eat much dinner".    OBJECTIVE:   Mental Status:  Patient was alert and focused.   Treatment &  Interventions:    Neuro-Linguistics:   Orientation:   Independent  Recent recall:   Independent  3 word recent recall:  Independent  Answering questions from paragraph: Independent  Naming items in categories:   Things in purse- listed 10 independently        Kinds of oils- listed 10 with min assist        Spray cans- listed 7 independently        Fruits with seeds/pits- listed 8 independently     Response & Tolerance to Activities:   Patient was motivated and participated in all tasks.     PAIN:  Start of Tx: none reported  End of Tx: none reported     After treatment:   []        Patient left in no apparent distress sitting up in chair  [x]        Patient left in no apparent distress in bed  [x]        Call bell left within reach  []        Nursing notified  []        Caregiver present  []        Bed alarm activated      COMMUNICATION/EDUCATION:   [x]  Patient educated regarding compensatory speech/language/comprehension techniques provided via demonstration, verbalization and teach back of comprehension  [x]  Patient/family have participated as able in goal setting and plan of care.  []   Patient/family agree to work toward stated goals and plan of care.  []  Patient understands intent and goals of therapy, neutral about participation.  []  Patient unable to participate in goal setting/plan of care secondary to cognition, hearing/vision deficits; education ongoing with interdisciplinary staff       HUNTER BOWEN

## 2022-01-13 NOTE — Progress Notes (Signed)
Latest Reference Range & Units 01/13/22 08:32   POC Glucose 70 - 110 mg/dL 60 (L)   (L): Data is abnormally low    [phone] Patient in bedside  commode at time BG checked- while getting patient situated back to bed and cleaned- she began eating graham crackers with peanut butter at time of next 15 minute recheck, as a result recheck was late. Melanie primary RN notified.    Toy Baker MSN, RN, AGCNS-BC  Inpatient Rehab Interim Clinical Care Lead  [phone]

## 2022-01-13 NOTE — Progress Notes (Signed)
  Subjective:  Patient ID: Julie Elliott, female    DOB: 26-Apr-1956,  MRN: [id number]  Chief Complaint  Patient presents with   pressure sore right foot    Pressure sore right foot.    66 y.o. female presents with the above complaint. History confirmed with patient.  The lesion has been increasing in size and changing in appearance over the last year or 2 she thinks.  She is here with her son today.  Objective:  Physical Exam: warm, good capillary refill, no trophic changes or ulcerative lesions, normal DP and PT pulses, normal sensory exam, and there is a pigmented lesion on the medial facing side of the right fifth toe.     Assessment:   1. Pigmented skin lesion of uncertain nature      Plan:  Patient was evaluated and treated and all questions answered.  Discussed the possible etiologies of this including benign nevus versus possible melanoma.  Clinically does appear to be flat and benign but I did recommend a soft tissue biopsy.  She will return next week for biopsy of the toe and the skin lesion.  We discussed the risk and benefits of this procedure.  Discussed the postprocedural recovery process.  I will see her next week for the biopsy.  No follow-ups on file.

## 2022-01-14 ENCOUNTER — Ambulatory Visit (INDEPENDENT_AMBULATORY_CARE_PROVIDER_SITE_OTHER): Payer: Medicare Other | Admitting: Podiatry

## 2022-01-14 ENCOUNTER — Other Ambulatory Visit: Payer: Self-pay | Admitting: Student

## 2022-01-14 ENCOUNTER — Other Ambulatory Visit: Payer: Self-pay

## 2022-01-14 DIAGNOSIS — L819 Disorder of pigmentation, unspecified: Secondary | ICD-10-CM | POA: Diagnosis not present

## 2022-01-14 DIAGNOSIS — D2271 Melanocytic nevi of right lower limb, including hip: Secondary | ICD-10-CM | POA: Diagnosis not present

## 2022-01-14 DIAGNOSIS — I5042 Chronic combined systolic (congestive) and diastolic (congestive) heart failure: Secondary | ICD-10-CM

## 2022-01-14 LAB — BASIC METABOLIC PANEL
Anion Gap: 4 mmol/L (ref 3.0–18)
BUN/Creatinine Ratio: 27 — ABNORMAL HIGH (ref 12–20)
BUN: 22 mg/dL — ABNORMAL HIGH (ref 7.0–18)
CO2: 27 mmol/L (ref 21–32)
Calcium: 10.2 mg/dL — ABNORMAL HIGH (ref 8.5–10.1)
Chloride: 112 mmol/L — ABNORMAL HIGH (ref 100–111)
Creatinine: 0.82 mg/dL (ref 0.6–1.3)
Est, Glom Filt Rate: >60 mL/min/{1.73_m2} (ref >60)
Glucose: 159 mg/dL — ABNORMAL HIGH (ref 74–99)
Potassium: 3.5 mmol/L (ref 3.5–5.5)
Sodium: 143 mmol/L (ref 136–145)

## 2022-01-14 LAB — CBC WITH AUTO DIFFERENTIAL
Basophils %: 0 % (ref 0–2)
Basophils Absolute: 0 10*3/uL (ref 0.0–0.1)
Eosinophils %: 4 % (ref 0–5)
Eosinophils Absolute: 0.2 10*3/uL (ref 0.0–0.4)
Hematocrit: 27.7 % — ABNORMAL LOW (ref 35.0–45.0)
Hemoglobin: 8.4 g/dL — ABNORMAL LOW (ref 12.0–16.0)
Immature Granulocytes %: 1 % — ABNORMAL HIGH (ref 0.0–0.5)
Immature Granulocytes Absolute: 0.1 10*3/uL — ABNORMAL HIGH (ref 0.00–0.04)
Lymphocytes %: 11 % — ABNORMAL LOW (ref 21–52)
Lymphocytes Absolute: 0.6 10*3/uL — ABNORMAL LOW (ref 0.9–3.6)
MCH: 24.6 pg (ref 24.0–34.0)
MCHC: 30.3 g/dL — ABNORMAL LOW (ref 31.0–37.0)
MCV: 81.2 FL (ref 78.0–100.0)
MPV: 9.7 FL (ref 9.2–11.8)
Monocytes %: 12 % — ABNORMAL HIGH (ref 3–10)
Monocytes Absolute: 0.7 10*3/uL (ref 0.05–1.2)
Neutrophils %: 71 % (ref 40–73)
Neutrophils Absolute: 4 10*3/uL (ref 1.8–8.0)
Nucleated RBCs: 0 /100{WBCs}
Platelets: 322 10*3/uL (ref 135–420)
RBC: 3.41 M/uL — ABNORMAL LOW (ref 4.20–5.30)
RDW: 17.2 % — ABNORMAL HIGH (ref 11.6–14.5)
WBC: 5.6 10*3/uL (ref 4.6–13.2)
nRBC: 0 10*3/uL (ref 0.00–0.01)

## 2022-01-14 LAB — ELECTROPHORESIS PROTEIN, SERUM
Albumin/Globulin Ratio: 1 (ref 0.7–1.7)
Albumin: 2.5 g/dL — ABNORMAL LOW (ref 2.9–4.4)
Alpha-1-Globulin: 0.3 g/dL (ref 0.0–0.4)
Alpha-2-Globulin: 0.8 g/dL (ref 0.4–1.0)
Beta Globulin: 0.9 g/dL (ref 0.7–1.3)
Gamma Globulin: 0.6 g/dL (ref 0.4–1.8)
Globulin: 2.6 g/dL (ref 2.2–3.9)
M-Spike: 0.2 g/dL — ABNORMAL HIGH
Total Protein: 5.1 g/dL — ABNORMAL LOW (ref 6.0–8.5)

## 2022-01-14 LAB — POCT GLUCOSE
POC Glucose: 248 mg/dL — ABNORMAL HIGH (ref 70–110)
POC Glucose: 172 mg/dL — ABNORMAL HIGH (ref 70–110)
POC Glucose: 117 mg/dL — ABNORMAL HIGH (ref 70–110)
POC Glucose: 269 mg/dL — ABNORMAL HIGH (ref 70–110)

## 2022-01-14 MED ORDER — BIOMEPRO PO CPDR
Freq: Every day | ORAL | Status: AC
Start: 2022-01-14 — End: 2022-01-25
  Administered 2022-01-14 – 2022-01-22 (×9): 1 via ORAL

## 2022-01-14 MED FILL — HUMALOG 100 UNIT/ML IJ SOLN: 100 UNIT/ML | INTRAMUSCULAR | Qty: 2 | Fill #0

## 2022-01-14 MED FILL — VANCOMYCIN HCL 750 MG IV SOLR: 750 MG | INTRAVENOUS | Qty: 750 | Fill #0

## 2022-01-14 MED FILL — FUROSEMIDE 40 MG PO TABS: 40 MG | ORAL | Qty: 1 | Fill #0

## 2022-01-14 MED FILL — PIPERACILLIN SOD-TAZOBACTAM SO 3.375 (3-0.375) G IV SOLR: 3.375 (3-0.375) g | INTRAVENOUS | Qty: 3375 | Fill #0

## 2022-01-14 MED FILL — ATORVASTATIN CALCIUM 40 MG PO TABS: 40 MG | ORAL | Qty: 2 | Fill #0

## 2022-01-14 MED FILL — ASPIRIN LOW DOSE 81 MG PO CHEW: 81 MG | ORAL | Qty: 1 | Fill #0

## 2022-01-14 MED FILL — THERAPEUTIC-M PO TABS: ORAL | Qty: 1 | Fill #0

## 2022-01-14 MED FILL — LOVENOX 40 MG/0.4ML IJ SOSY: 40 MG/0.4ML | INTRAMUSCULAR | Qty: 0.4 | Fill #0

## 2022-01-14 MED FILL — LANTUS 100 UNIT/ML SC SOLN: 100 UNIT/ML | SUBCUTANEOUS | Qty: 32 | Fill #0

## 2022-01-14 MED FILL — HYDROCODONE-ACETAMINOPHEN 5-325 MG PO TABS: 5-325 MG | ORAL | Qty: 1 | Fill #0

## 2022-01-14 MED FILL — BIO-K PLUS STRONG PO CPDR: ORAL | Qty: 1

## 2022-01-14 MED FILL — FERROUS SULFATE 325 (65 FE) MG PO TBEC: 325 (65 Fe) MG | ORAL | Qty: 1 | Fill #0

## 2022-01-14 NOTE — Progress Notes (Signed)
[facility]  Team Conference  Date: 01/14/2022  ACTIVE MONITORING OF CO-MORBID CONDITIONS/MANAGEMENT OF NEW MEDICAL ISSUES: Acute CVA (cerebrovascular accident) ([place]) [I63.9]    **Please refer to patient's electronic medical record to view the care plan and goals**  NURSING Making gains Yes   Skin Care: Skin Integumentary   Skin Color: Pink  Skin Condition/Temp: Dry, Warm  Skin Integrity: Wound (see LDA)  Location: sacrum  Wound Prevention/Protection Method: Yes  Location of Wound Prevention: Buttocks, Coccyx, Heel  Orientation of Wound Prevention: Posterior  Wound Offloading (Prevention Methods): Bed, pressure redistribution/air, Bed, pressure reduction mattress, Repositioning, Wheelchair  Dressing Present : Yes  Skin Assessed Underneath Dressing This Shift: No  Skin Fold Management: No  Nails: Within Defined Limits    Wound Care:           Pain: Pain Assessment  Pain Assessment: 0-10 (01/14/22 1222)  Pain Level: 8 (01/14/22 1000)  Wong-Baker Pain Rating: No hurt (01/14/22 0400)  Patient's Stated Pain Goal: 0 - No pain (01/14/22 1222)  Pain Location: Back;Leg (01/14/22 0919)  Pain Orientation: Right;Left;Posterior (01/14/22 0919)  Pain Descriptors: Aching;Spasm (01/14/22 0919)  Functional Pain Assessment: Activities are not prevented (01/14/22 0919)  Pain Type: Acute pain (01/14/22 0919)  Pain Frequency: Continuous (01/13/22 0815)  Pain Onset: On-going (01/14/22 0919)  Non-Pharmaceutical Pain Intervention(s): Repositioned (01/14/22 0919)  Response to Pain Intervention: Progressing toward functional goals (comment) (01/14/22 1000)  Side Effects: No reported side effects (01/14/22 1000)    Bladder/Bowel Urine Assessment  Urinary Status: Voiding, Incontinent briefs, Incontinent  Urinary Incontinence: Present  Urine Color: Yellow/straw  Urine Appearance: Clear  Urine Odor: No odor Stool Assessment  Incontinence: No  Stool Appearance: Formed  Stool Color: Jayshon Dommer  Stool Amount: Medium  Stool  Source: Rectum  Last BM (including prior to admit): 01/14/22   [x]   Continent bladder        [x]  Incontinent bladder           [x] Bladder training      [x]   Continent bowel        [x]  Incontinent bowel              [x] Bowel training    Education:   [x]  Lovenox/Coumadin education    [x]  Diabetic Teaching   []  Tube Feeding Education      [x]  Wound Care education           [x]  Other: medication, fall risk, pain mgt, pressure injury, picc care   []  Family Teaching completed:  No   []  Lab value concerns   No       Occupational Therapy  Making gains  No   Treatment Barriers:  []  Fatigue                   []  Pain                  []  Participation      [x]  Cognition             [x]  Incontinence           [x]  Precautions      []  Communication/Language   [x]  Safety awareness  [x]  Medical Complexity       []  Visual/Perceptual deficits     []  Other:    Treatment Areas of Focus/Interventions: [x] Coordination     [x] Strength   [x] ADL Training     [x] Transfer Training        [] Cognitive Training      []   Perceptual Training        [] Home Management Training   [] Improved UE Function    [] Functional Mobility Training    [] Toileting schedule      [] OOB schedule            [] Modalities (E-stim, etc.):   [x]  other: safety    []  Family Teaching completed No      Accessibility Limitations:unknown          Physical Therapy Making gains Yes   Treatment Barriers:  []  Fatigue                   [x]  Pain                  [x]  Participation      [x]  Incontinence          [x]  Precautions      []  Communication/Language  [] Mental Status   [x]  Safety awareness  []  Medical Status       [x]  Other: cognition, slow initiation, muscle spasms    Treatment Areas of Focus/Interventions: [x] Balance     [x] Strength   [x] Coordination     [x] Transfer Training        [x] Mobility     [] Sitting Balance  [x] Gait Training  [x] Improve safety awareness     [x] W/C Mobility         [] Modalities (E-stim, Lite gait etc.):   []  Family Teaching completed No      Accessibility  Limitations:3 steps to enter and threshold      Speech Therapy Making gains Yes   Treatment Barriers:  []  Fatigue                   []  Pain                  []  Participation      [] Impulsivity               []  Aphasia                  [] Attention           [x] Cognition   []  Dysphagia              []  Other:    Treatment Areas of Focus/Interventions: [] Attention   [] Orientation   [] Executive function   [] Problem solving        [] Language      [] Speech  [] Memory  [] Swallowing   [] Safety/Awareness     [x]  Other: cognition   []  Family Teaching completed No     Therapy Minutes Density Met: Yes    Therapy Minutes Not met Action/Justification:   []  Pt on medical hold  []  Pt refusing therapy despite encouragement and education on benefits of therapy  []  Pt displays decreased tolerance to therapy  []  Other     CMG Date: 01/30/2022  Estimated Discharge Date: 01/24/2022  [x] Rehabilitation goals from IPOC/Treatment plan reviewed  [] No changes identified  [x] Goal(s) changed: Bowel and bladder training    Plan for upcoming treatment period/intervention for current barriers:  [x]  Pt will increase activity tolerance for daily tasks.  [x]  Pt will improve bed mobility with reduced assist.  [x]  Pt will improve safety in fx tasks with reduced cues/assist  [x]  Pt will improve transfers with reduced assist  [x]  Pt will improve toileting/incontinence care  [x]  Pt will improve ADL's with use of adaptive equipment with reduced assist  []  Pt will improve pain mgmt for  maximum participation in tx program  [x]  Pt will improve cognition  []  Pt will improve swallowing for safe diet advancement with use of strategies  []   Continue care and education for wound vac, iv antibiotics, ;pressure injury, picc line care  Patient needs identified [x] Patient/Family Education   [] Family Conference      [] Family Training  Recommended Discharge Plan [] home alone   [] home alone with assist prn    [x] Continuous supervision       [] Return home with  s/o/spouse/family   []  Assisted living                     [x] SNF   Home Health pt, ot, slp, nursing (iv abx, wound vac), aide, msw    CURRENT EQUIPEMENT NEEDS:  Equipment needed at discharge: Rolling walker, Wheelchair, and Wheelchair cushion    Wheelchair: Standard Wheelchair hemi height, 18 inch, Elevating Leg Rest Pair, Anti-tipping devices pair, Wheel lock brake extension handle left, and Wheel lock brake extension handle right, Seat cushion skin protection width less than 22 inches      ADL Equipments:None    Plan/Adjustments to Plan   [x] Medical conditions exist that require a minimum of 3 times/week physician      oversight and 24-hour rehabilitation nursing to manage/progress the plan of care   [x] Functional deficits require intensive and coordinated therapies to achieve   goals outlined in plan of care    [x] 3 hours therapy 5 days/week OR 15 hours therapy over 7 days    [x] Continued plan of care as patient is showing progress and /or has an expectation to benefit    Patient's plan of care has been reviewed and/or adjusted. Continue treatment as outlined.   I have led this Team Conference and agree with the plan, Michel Bickers, MD, 01/14/2022, 1:30 PM  Physician signature:      Date/time:    Team Conference Recorder Lolita Cram  Date 01/14/2022    Team members participating in today's conference.   [x]   Rivka Barbara, PT     []                      [x]  Bayard Beaver, OT         [x]  Tempie Hoist, OT       [x]  Elisha Headland, OT          [x]   Carita Pian, SLP    [x]  Lolita Cram, SW        [x]  Eula Listen, RN    [x]  Lelon Frohlich, NP  []  Other

## 2022-01-14 NOTE — Plan of Care (Signed)
Problem: Physical Therapy - Adult  Goal: By Discharge: Performs mobility at highest level of function for planned discharge setting.  See evaluation for individualized goals.  Description: Physical Therapy Short Term Goals  Initiated 01/07/2022, reassessed 01/14/2022, and to be accomplished within 7 day(s) (01/14/2022)  1.  Patient will supine to sit, sit to supine, roll right, and roll left  in bed with moderate assistance .  MET 01/14/2022   Updated Goal: Patient will supine to sit, sit to supine, roll right, and roll left  in bed with minimal assistance.  2.  Patient will transfer from bed to chair and chair to bed with minimal assistance/contact guard assist using the least restrictive device.  MET 01/14/2022   Updated Goal:  Patient will transfer from bed to chair and chair to bed with supervision using the least restrictive device.  3.  Patient will perform sit to stand with minimal assistance/contact guard assist.  MET 01/14/2022   Updated Goal:  Patient will perform sit to stand with supervision.  4.  Patient will ambulate with minimal assistance/contact guard assist for 50 feet with the least restrictive device.  MET 01/14/2022   Updated Goal:  Patient will ambulate with supervision for 150 feet with the least restrictive device.   5.  Patient will ascend/descend 3 stairs with B handrail(s) with minimal assistance/contact guard assist.  Not Met - Ongoing    Physical Therapy Long Term Goals  Initiated 01/07/2022 and to be accomplished within 14-21 day(s) (01/28/2022)  1.  Patient will supine to sit, sit to supine, roll right, and roll left in bed with supervision/set-up.    2.  Patient will transfer from bed to chair and chair to bed with supervision/set-up using the least restrictive device.  3.  Patient will perform sit to stand with supervision/set-up.  4.  Patient will ambulate with supervision/set-up for 150 feet with the least restrictive device.   5.  Patient will ascend/descend 4 stairs with 1 handrail(s)  with supervision/set-up      Outcome: Progressing      PHYSICAL THERAPY WEEKLY PROGRESS NOTE    Patient: [name] Short (66 y.o. female)  Date: 01/14/2022  Diagnosis: Acute CVA (cerebrovascular accident) [facility]) [I63.9]   Precautions: Fall Risk Precautions, Cervical Spinal Precautions  Chart, physical therapy assessment, plan of care and goals were reviewed.    Time in:0755  Time [id number]  Entered Differentiated Treatment minutes: Yes    Patient seen for: Patient education, Training and development officer, Hotel manager, Personnel officer, Dentist    Time in:1200  Time out:1245  Entered Differentiated Treatment minutes: Yes    Patient seen for: Patient education, Training and development officer, Balance Training      Pain:  Pt pain was reported as 7/10 pre-treatment.  Pt pain was reported as 7/10 post-treatment.   Nursing staff notified and aware of reports of pain in sacral wound, B LE, and back.  Pt's nurse notes she will be bringing patient pain medication.  Spoke with nursing staff re: pt's c/o B LE muscle spasms and educated pt on importance of participation in mobility training to promote increased ease and frequency of repositioning for return to PLOF with mobility as well as addressing skin integrity.      Patient identified with name and DOB: Yes    SUBJECTIVE:     Pt requires increased time to initiate any mobility citing discomfort and pain in sacral region as well as pt appearing to engage in increased conversation requiring redirection to initiate task.  Pt verbalizes understanding of education provided noting, "I'm trying."    OBJECTIVE DATA SUMMARY:     Education: Education Given To: Patient  Education Provided: Precautions;Energy Conservation;Safety;Fall Prevention Strategies;Role of Therapy;Plan of Care;Transfer Training;Mobility Training  Education Method: Demonstration;Verbal  Barriers to Learning: Cognition  Education Outcome: Verbalized understanding;Continued education needed;Demonstrated  understanding  GROSS ASSESSMENT Weekly Progress Assessment 01/14/2022   AROM  RLE   LLE   [facility]  WFL   Strength  RLE  LLE   Exception  Exception  Decreased B LE functional strength with increased reliance on B UE with sit <> stand and ambulation with RW noted.   Coordination  Impaired   Tone  RLE  LLE   [facility]  WFL   Sensation Impaired   PROM  RLE  LLE   [facility]  WFL     MMT Weekly Assessment: Not formally assessed due to pt's increased time to initiate mobility and frequent toileting needs during sessions.   Right Lower Extremity Left Lower Extremity   Hip Flexion       Knee Extension       Knee Flexion       Ankle Dorsiflexion       0/5 No palpable muscle contraction  1/5 Palpable muscle contraction, no joint movement  2-/5 Less than full range of motion in gravity eliminated position  2/5 Able to complete full range of motion in gravity eliminated position  2+/5 Able to initiate movement against gravity  3-/5 More than half but not full range of motion against gravity  3/5 Able to complete full range of motion against gravity  3+/5 Completes full range of motion against gravity with minimal resistance  4-/5 Completes full range of motion against gravity with minimal-moderate resistance  4/5 Completes full range of motion against gravity with moderate resistance  4+/5 Completes full range of motion against gravity with moderate-maximum resistance  5/5 Completes full range of motion against gravity with maximum resistance    POSTURE Weekly Progress Assessment 01/14/2022   Posture (WDL) Poor   Posture Assessment Forward Head, Rounded Shoulder Posture, Posterior lean in sitting with PPT       BALANCE Weekly Progress Assessment 01/14/2022   Posture Poor   Sitting - Static Good   Sitting - Dynamic Fair   Standing - Static Poor;+   Standing - Dynamic Poor;+     BED/CHAIR/WHEELCHAIR TRANSFERS Initial Assessment Weekly Progress Assessment 01/14/2022   Rolling Right Maximum assistance Minimal assistance;Moderate assistance at pelvis  and trunk with moderate verbal cuing for log roll and decreased pulling on bed rail with UE to maintain cervical spinal precautions   Rolling Left Maximum assistance Minimal assistance;Moderate assistance at pelvis and trunk with moderate verbal cuing for log roll and decreased pulling on bed rail with UE to maintain cervical spinal precautions   Supine to Sit Moderate assistance (Pt requird increased encouragement and time to transition to seated position.) Moderate assistance (requiring increased time and asst with BLE)  For log roll and trunk control with transition through right side lying.   Sit to Stand Moderate Assistance Minimal Assistance with increased time and encouragement utilizing B UE to assist with lift off and CGA to minimal assistance for slow controlled descent with maximal cuing for increased hip flexion with stand to sit transfers and decreased compensatory reliance on B UE strength   Sit to Supine Maximum assistance Moderate assistance for B LE management against gravity and for maintaining cervical spinal precautions.   Transfer Assist Score  Moderate assistance          Minimal assistance (stand step with RW)  For sit <> stand and safe RW management with decreased B foot clearance noted.    Pt assisted to bedside commode at end of first session with hand off to nursing staff and at end of second session requiring minimal assistance for stand step transfers with RW.   Chemical engineer Type  N/A       [facility] MOBILITY/MANAGEMENT Initial Assessment Weekly Progress Assessment 01/14/2022   Able to Propel 70 Feet 150 Feet   Assist Level Moderate assistance Supervision (with increased time and moderate verbal cues)   Curbs/ramps assistance required   NT   Wheelchair management   Manages Right Brake with supervision and Left Brake with brake extender with supervision        GAIT Initial Assessment Weekly Progress Assessment 01/14/2022   Quality of Gait   Decreased B foot clearance, decreased  left step length with decreased right stance time with partial step through pattern   Gait Deviations   Decreased step height;Decreased step length       WALKING INDEPENDENCE Initial Assessment Weekly Progress Assessment 01/14/2022   Assistive device Rolling [place]   Ambulation assistance - surface Moderate assistance  Level tile Minimal assistance  Level tile     Distance 12 70 Feet70 Feet   Comments    Pt requires minimal assistance for balance and safe RW management with moderate verbal cues for forward gaze and B foot clearance.   Ambulation-uneven surface NT NT       STEPS/STAIRS Initial Assessment Weekly Progress Assessment 01/14/2022   Steps/Stairs ambulated             [employer] Use           Assistance Level   Attempted to initiate - pt with stress incontinence and required toileting assistance therefor was unable to participate in intervention.   Comments      Curbs/Ramps           ASSESSMENT:  Pt is slowly progressing with functional mobility achieving 4/5 short term goals for functional mobility.  However, pt has been limited by impaired functional strength, impaired balance, increased pain and muscle spasms and a sacral wound with wound vac care.  Progression toward goals:  _0       Improving appropriately and progressing toward goals  _1       Improving slowly and progressing toward goals  _2       Not making progress toward goals and plan of care will be adjusted     PLAN:  Patient continues to benefit from skilled intervention to address the above impairments.  Continue treatment per established plan of care.  Emphasize ongoing skilled intervention to address functional impairments and promote increased safety and independence with mobility.  Discharge Recommendations:  Home with Home health PT;24 hour supervision or assist;Subacute/Skilled Nursing Facility  Further Equipment Recommendations for Discharge:        Rolling Walker            Estimated Discharge Date: 01/24/2022    Activity  Tolerance:   Fair - limited by frequent/stress incontinence, pain, and LE muscle spasms  Please refer to the flowsheet for vital signs taken during this treatment.  After treatment:   _3  Patient left in no apparent distress in bed  _4  Patient left in no apparent distress sitting up on bedside commode with direct hand-off to nursing  staff present with pt in room to attend to pt needs after first treatment session.  Pt left sitting in w/c eating lunch in dining room with direct supervision from staff after second treatment session.  _0  Patient left in no apparent distress in w/c mobilizing under own power  _1  Patient left in no apparent distress dining area  _2  Patient left in no apparent distress mobilizing under own power  _3  Call bell left within reach  _4  Nursing notified  _5  Caregiver present  _6  Bed alarm activated   _7  Chair alarm activated      Rivka Barbara, PT, DPT  01/14/2022

## 2022-01-14 NOTE — Plan of Care (Cosign Needed)
Problem: SLP Adult - Disturbed Thought Process  Goal: By Discharge: Demonstrates cognitive skills at highest level of function for planned discharge setting.   See evaluation for individualized goals.  Description: Long term goals (Initiated 01/07/22 to be accomplished by 01/21/2022)  Patient will:  1. Be oriented x 3 and recall events of the day, supervision.  2. Follow 3 part and complex commands, 90% accuracy.  3.  Listen to 3-4 sentence length paragraphs and respond to questions re: content with 90% accuracy.  4.  List 10-12 items in categories of increasing abstraction, supervision.  5.  Discuss similarities/differences given 2 items, 80-90% accuracy.    Short term goals (by 01/14/22)  Patient will:  1.  Be oriented x 3 and recall events of the day, min assist.  2.  Follow 3 part and complex commands, 70-60% accuracy.  3.  Listen to 3-4 sentence length paragraphs and respond to questions re: content with 70-80% accuracy.  4.  List 10-12 items in categories of increasing abstraction, min assist.  5.  Discuss similarities/differences given 2 items, 70-80% accuracy.      Note:   SPEECH-LANGUAGE TREATMENT    Patient: [name] Short (66 y.o. female)  Date: 01/14/2022  Diagnosis: Acute CVA (cerebrovascular accident) [facility]) [I63.9] Acute CVA (cerebrovascular accident) (HCC)      Precautions: Standard  PLOF: As per H&P     ASSESSMENT:  Patient was jovial and participated in all task outside of time spent to reposition during leg spasms.   Progression toward goals:  [x]        Improving appropriately and progressing toward goals  []        Improving slowly and progressing toward goals  []        Not making progress toward goals and plan of care will be adjusted     PLAN:  Patient continues to benefit from skilled intervention to address the above impairments.  Continue treatment per established plan of care.     SUBJECTIVE:   Patient stated "I need some coffee".    OBJECTIVE:   Mental Status:  Patient was alert and focused during  today's session.   Treatment & Interventions:    Neuro-Linguistics:   Orientation:    Supervised  Answering questions for paragraphs: 100% with minimal assistance.  Naming in categories:   Mens names- listed 12 independently       Product names- listed 12 independently         Response & Tolerance to Activities:   Patient persevered with therapy and stays positive through discomfort.     PAIN:  Start of Tx: present, patient experienced leg spasms and called nurse to reposition  End of Tx: issue resolved     After treatment:   []        Patient left in no apparent distress sitting up in chair  [x]        Patient left in no apparent distress in bed  [x]        Call bell left within reach  []        Nursing notified  []        Caregiver present  []        Bed alarm activated      COMMUNICATION/EDUCATION:   []  Patient educated regarding compensatory speech/language/comprehension techniques provided via demonstration, verbalization and teach back of comprehension  [x]  Patient/family have participated as able in goal setting and plan of care.  [x]  Patient/family agree to work toward stated goals and plan of care.  []   Patient understands intent and goals of therapy, neutral about participation.  []  Patient unable to participate in goal setting/plan of care secondary to cognition, hearing/vision deficits; education ongoing with interdisciplinary staff       HUNTER BOWEN

## 2022-01-14 NOTE — Progress Notes (Signed)
[facility]  [address], [place], [place] [address]     INPATIENT REHABILITATION  DAILY PROGRESS NOTE     Date: 01/14/2022    Name: [name] Short Age / Sex: 66 y.o. / female   CSN: [id number] MRN: [id number]   Admit Date: 01/06/2022 Length of Stay: 8 days     Primary Rehabilitation Diagnosis impaired mobility and ADLs in the setting of 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         Subjective:     I personally saw and evaluated this patient face-to-face today.  Patient is laying in bed in no apparent distress.  Patient is awake and follows commands.  Daughter is at bedside    Objective:     Vital Signs:  Patient Vitals for the past 24 hrs:   BP Temp Temp src Pulse Resp SpO2   01/14/22 0919 138/76 -- -- 88 -- --   01/14/22 0736 (!) 151/64 97.9 F (36.6 C) Oral 77 18 98 %   01/13/22 2100 (!) 146/60 99.1 F (37.3 C) Oral 90 18 --   01/13/22 1621 (!) 155/72 98.9 F (37.2 C) Oral 87 16 100 %        Physical Examination:  General:  Awake, follows commands  Cardiovascular:  S1S2+, RRR  Pulmonary:  CTA b/l  GI:  Soft, BS+, NT, ND  Extremities:  + edema  Sacral decub, wound VAC in place    Current Medications:  Current Facility-Administered Medications   Medication Dose Route Frequency    lactobacillus delayed release capsule 1 capsule  1 capsule Oral Daily with breakfast    insulin lispro (HUMALOG) injection vial 4 Units  4 Units SubCUTAneous TID WC    hydrALAZINE (APRESOLINE) tablet 10 mg  10 mg Oral Q6H PRN    vancomycin (VANCOCIN) 750 mg in sodium chloride 0.9 % 250 mL IVPB (vial-mate)  750 mg IntraVENous Q12H    ferrous sulfate (FE TABS 325) EC tablet 325 mg  325 mg Oral Daily with breakfast    insulin glargine (LANTUS) injection vial 32 Units  32 Units SubCUTAneous Daily    ondansetron (ZOFRAN-ODT) disintegrating tablet 4 mg  4 mg SubLINGual Q8H PRN    aspirin chewable tablet 81 mg  81 mg Oral Daily    atorvastatin (LIPITOR) tablet 80 mg  80 mg Oral Nightly    enoxaparin  (LOVENOX) injection 40 mg  40 mg SubCUTAneous Daily    furosemide (LASIX) tablet 40 mg  40 mg Oral Daily    HYDROcodone-acetaminophen (NORCO) 5-325 MG per tablet 1 tablet  1 tablet Oral Q4H PRN    therapeutic multivitamin-minerals 1 tablet  1 tablet Oral Daily    glucose chewable tablet 16 g  4 tablet Oral PRN    dextrose bolus 10% 125 mL  125 mL IntraVENous PRN    Or    dextrose bolus 10% 250 mL  250 mL IntraVENous PRN    glucagon (rDNA) injection 1 mg  1 mg SubCUTAneous PRN    dextrose 10 % infusion   IntraVENous Continuous PRN    acetaminophen (TYLENOL) tablet 650 mg  650 mg Oral Q4H PRN    polyethylene glycol (GLYCOLAX) packet 17 g  17 g Oral Daily PRN    insulin lispro (HUMALOG) injection vial 0-8 Units  0-8 Units SubCUTAneous TID WC    insulin lispro (HUMALOG) injection vial 0-4 Units  0-4 Units SubCUTAneous Nightly    piperacillin-tazobactam (ZOSYN) 3,375 mg in sodium chloride  0.9 % 50 mL IVPB (mini-bag)  3,375 mg IntraVENous Q8H       Allergies:  Allergies   Allergen Reactions    Lactose Other (See Comments)     Intolerance-Pt denies       Lab/Data Review:  Recent Results (from the past 24 hour(s))   POCT Glucose    Collection Time: 01/13/22  4:54 PM   Result Value Ref Range    POC Glucose 268 (H) 70 - 110 mg/dL   POCT Glucose    Collection Time: 01/13/22  9:02 PM   Result Value Ref Range    POC Glucose 248 (H) 70 - 110 mg/dL   Basic Metabolic Panel    Collection Time: 01/14/22  5:10 AM   Result Value Ref Range    Sodium 143 136 - 145 mmol/L    Potassium 3.5 3.5 - 5.5 mmol/L    Chloride 112 (H) 100 - 111 mmol/L    CO2 27 21 - 32 mmol/L    Anion Gap 4 3.0 - 18 mmol/L    Glucose 159 (H) 74 - 99 mg/dL    BUN 22 (H) 7.0 - 18 MG/DL    Creatinine 1.610.82 0.6 - 1.3 MG/DL    Bun/Cre Ratio 27 (H) 12 - 20      Est, Glom Filt Rate >60 >60 ml/min/1.5573m2    Calcium 10.2 (H) 8.5 - 10.1 MG/DL   CBC with Auto Differential    Collection Time: 01/14/22  5:10 AM   Result Value Ref Range    WBC 5.6 4.6 - 13.2 K/uL    RBC 3.41 (L)  4.20 - 5.30 M/uL    Hemoglobin 8.4 (L) 12.0 - 16.0 g/dL    Hematocrit 09.627.7 (L) 35.0 - 45.0 %    MCV 81.2 78.0 - 100.0 FL    MCH 24.6 24.0 - 34.0 PG    MCHC 30.3 (L) 31.0 - 37.0 g/dL    RDW 04.517.2 (H) 40.911.6 - 14.5 %    Platelets 322 135 - 420 K/uL    MPV 9.7 9.2 - 11.8 FL    Nucleated RBCs 0.0 0 PER 100 WBC    nRBC 0.00 0.00 - 0.01 K/uL    Neutrophils % 71 40 - 73 %    Lymphocytes % 11 (L) 21 - 52 %    Monocytes % 12 (H) 3 - 10 %    Eosinophils % 4 0 - 5 %    Basophils % 0 0 - 2 %    Immature Granulocytes 1 (H) 0.0 - 0.5 %    Neutrophils Absolute 4.0 1.8 - 8.0 K/UL    Lymphocytes Absolute 0.6 (L) 0.9 - 3.6 K/UL    Monocytes Absolute 0.7 0.05 - 1.2 K/UL    Eosinophils Absolute 0.2 0.0 - 0.4 K/UL    Basophils Absolute 0.0 0.0 - 0.1 K/UL    Absolute Immature Granulocyte 0.1 (H) 0.00 - 0.04 K/UL    Differential Type AUTOMATED     POCT Glucose    Collection Time: 01/14/22  7:23 AM   Result Value Ref Range    POC Glucose 172 (H) 70 - 110 mg/dL   POCT Glucose    Collection Time: 01/14/22 12:08 PM   Result Value Ref Range    POC Glucose 117 (H) 70 - 110 mg/dL         Assessment:     Primary Rehabilitation Diagnosis impaired mobility and ADLs in the setting of acute lacunar infarct (CVA) in the  left thalamus     Other Co-Morbid Conditions managed in Rehab   Cervical spondylosis with myelopathy, status post laminectomy and fusion of C3-C6 and external bone growth stimulator placement on Dec 09, 2021  Infected sacral decubitus ulcer-status post debridement  Type 2 diabetes with hyperglycemia  Hypertension  Hypomagnesemia  Anemia    PLAN  -impaired mobility and ADLs in the setting of 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   Aspirin and statin  Physical therapy, Occupational Therapy, speech therapy     -Cervical spondylosis with myelopathy, status post laminectomy and fusion of C3-C6 and external bone growth stimulator placement on Dec 09, 2021-PT and OT.  Judicious pain control.  Bowel regimen        -Infected sacral  decubitus ulcer-status post debridement.  Continue wound care and IV antibiotics.  Routine PICC line care.  Wound care was reconsulted and saw the patient earlier today.  I have added probiotics today     -Type 2 diabetes with hyperglycemia.  Continue Lantus insulin and prandial insulin and sliding scale insulin.  Monitor sugars  On January 09, 2022 I increased Lantus from 30 units to 32 units daily, monitor.  Continue current regimen  On January 11, 2022 I have increased the prandial insulin from 3 units 3 times daily to 4 units 3 times daily with meals  Some low sugars noted this morning.  Will decrease Lantus back to 30 units subcu daily.    Continue to monitor sugars     -Hypertension-blood pressure is trending up.  Add as needed hydralazine  Blood pressure is trending high.  On January 13, 2022 I have added amlodipine 2.5 mg p.o. daily.    Monitor blood pressure        -Hypomagnesemia-continue to supplement p.o. magnesium    -Hypokalemia  On January 07, 2022 potassium was 3.0.  Repleted  On January 08, 2022 potassium is 3.7  On January 09, 2022 potassium was 3.6  On January 10, 2022 potassium is 3.6  On January 14, 2022 potassium is 3.5     -Anemia-continue ferrous sulfate, monitor  On January 07, 2022 hemoglobin is 8.3  On January 14, 2022 hemoglobin is 8.4    -Hypercalcemia, primary hyperparathyroidism  On January 07, 2022 calcium was 10.6  On January 08, 2022 calcium is 28  On January 09, 2022 calcium is 10.9  On January 10, 2022 calcium is 10.5  On January 14, 2022 calcium is 10.2  Patient follows with endocrinologist as an outpatient     Fall precautions, aspiration precautions  I discussed with patient and daughter at bedside.  I discussed with therapist.  I discussed with social worker    Functional Progress:    PHYSICAL THERAPY    ON ADMISSION MOST RECENT   Balance  Balance  Posture: Poor  Sitting - Static: Good  Sitting - Dynamic: Fair  Standing - Static: Poor, +  Standing - Dynamic: Poor, +  Poor  Good  Fair  Poor;+  Poor;+      Balance  Balance  Posture:  Poor  Sitting - Static: Good  Sitting - Dynamic: Fair  Standing - Static: Poor, +  Standing - Dynamic: Poor, +  Posture: Poor  Sitting - Static: Good  Sitting - Dynamic: Fair  Standing - Static: Poor;+  Standing - Dynamic: Poor;+        Bed Mobility  Maximum assistance  Maximum assistance  Moderate assistance (Pt requird increased encouragement and time to transition to  seated position.)  Moderate Assistance  Maximum assistance  Moderate assistance    Bed Mobility  Rolling to Right: Minimal assistance, Moderate assistance  Rolling to Left: Minimal assistance, Moderate assistance  Supine to Sit: Moderate assistance (requiring increased time and asst with BLE)  Sit to Stand: Minimal Assistance  Sit to Supine: Moderate assistance  Bed to Chair: Minimal assistance (stand step with RW)      Wheelchair Mobility  Yes       Wheelchair Mobility  Propulsion: Yes         Ambulation  Rolling Walker  Moderate assistance  Level tile  12 Ambulation  Device: Rolling Walker  Assistance: Minimal assistance  Surface: Level tile  Distance: 70 Feet   Stairs  No       Stairs  Stairs?: No           Functional Progress:    OCCUPATIONAL THERAPY    ON ADMISSION MOST RECENT   Eating  Supervision Eating  Feeding: Supervision   Grooming  Supervision, Setup (sitting EOB) Grooming  Grooming: Supervision, Setup   Bathing  UB Bathing Supervision, Setup (sitting EOB)  LB Bathing Maximum assistance Bathing  UB Bathing   UE Bathing: Supervision  LB Bathing   OEUM([id number])@   Upper Body Dressing  Maximum assistance   Upper Body Dressing  UE Dressing: Supervision, Stand by assistance, Verbal cueing   Lower Body Dressing  Maximum assistance Lower Body Dressing  LE Dressing: Moderate assistance, Maximum assistance (asst to thread BLE through pants using reacher and pull up around hips in standing .)   Toileting  Maximum assistance Toileting  Toileting: Moderate assistance   Toilet Transfers  Moderate assistance Toilet Transfers  Toilet Transfer:  Minimal assistance, Moderate assistance   Tub Transfers  Tub Transfers: Not tested  Engineer, manufacturing Transfers: Not tested Tub Transfers  Tub Transfers  Tub Transfers: Not tested  SHOWER Transfers  Tub Transfers  Tub Transfers: Not tested     Legend:   7 - Independent   6 - Modified Independent   5 - Standby Assistance / Supervision / Set-up   4 - Minimum Assistance / Contact Guard Assistance   3 - Moderate Assistance   2 - Maximum Assistance   1 - Total Assistance / Dependent             Plan:     1. Justification for continued stay: Fair progression towards established rehabilitation goals.  Patient is now able to do toileting with moderate assistance in comparison to requiring maximum assistance with toileting at the time of admission.    2. Medical Issues being followed closely:    [x]   Fall and safety precautions     [x]   Wound Care     [x]   Bowel and Bladder Function     [x]   Fluid Electrolyte and Nutrition Balance     [x]   Pain Control      3. Issues that 24 hour rehabilitation nursing is following:    [x]   Fall and safety precautions     [x]   Wound Care     [x]   Bowel and Bladder Function     [x]   Fluid Electrolyte and Nutrition Balance     [x]   Pain Control      [x]   Assistance with and education on in-room safety with transfers to and from the bed, wheelchair, toilet and shower.      4. Acute rehabilitation plan of care:    [x]   Continue current care and rehab.             Physical Therapy             Occupational Therapy             Speech Therapy       Hold Rehab until further notice     5. Medications:      MAR Reviewed       Continue Present Medications       6. Chemical DVT Prophylaxis:        Enoxaparin       Unfractionated Heparin       Warfarin       NOAC       Aspirin       None     7. Mechanical DVT Prophylaxis:        TED Stockings       Sequential Compression Device       None     8. GI Prophylaxis:        PPI       H2 Blocker       None / Not  indicated     9. Code status:Full    Dragon medical dictation software was used for portions of this report. Unintended errors may occur.      Signed:    Jonah Blue, MD      January 14, 2022

## 2022-01-14 NOTE — Plan of Care (Signed)
Problem: Chronic Conditions and Co-morbidities  Goal: Patient's chronic conditions and co-morbidity symptoms are monitored and maintained or improved  Outcome: Progressing     Problem: Safety - Adult  Goal: Free from fall injury  Outcome: Progressing     Problem: Skin/Tissue Integrity  Goal: Absence of new skin breakdown  Description: 1.  Monitor for areas of redness and/or skin breakdown  2.  Assess vascular access sites hourly  3.  Every 4-6 hours minimum:  Change oxygen saturation probe site  4.  Every 4-6 hours:  If on nasal continuous positive airway pressure, respiratory therapy assess nares and determine need for appliance change or resting period.  Outcome: Progressing     Problem: ABCDS Injury Assessment  Goal: Absence of physical injury  Outcome: Progressing     Problem: Pain  Goal: Verbalizes/displays adequate comfort level or baseline comfort level  Outcome: Progressing

## 2022-01-14 NOTE — Progress Notes (Signed)
Infectious Disease progress  Note        Reason: Infected sacral decubiti    Current abx Prior abx   Zosyn, vancomycin since 5/26      Lines:   Picc line LUE since 12/30/21    Assessment :   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     Cervical spondylotic myelopathy: s/p  C3, C4, C5, C6 laminectomy; posterior cervical fusion C3, C4, C5, C6; segmental instrumentation, [employer] type, C3, C4, C5, C6 with lateral mass screws on 12/09/21    Clinical presentation c/w infected necrotic sacral decubiti.    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     Recommendations:    Continue zosyn, vancomycin till 02/06/22 if plans to send patient to rehab  Recommend outpt IV zosyn, daptomycin till 02/06/22 if plans for patient to go home (risk of nephrotoxicity with prolonged outpatient IV vancomycin, difficulty monitoring levels)  Follow-up surgery recommendations regarding wound care of sacrum  Follow-up neurology/spine surgery recommendations given lower extremity weakness  Needs weekly cbc with diff, renal function panel while on iv antibiotics to monitor for antibiotic side effects    I had detailed discussion with patient about pros and cons of home IV antibiotics here.  Patient states that she wishes to go to rehab.  I was later informed by case manager that patient/family has consented.  I will write tentative home IV antibiotic orders.    Home health orders on chart (click on "chart review, other orders, IP home health)      Above plan was discussed in  details with patient, case manager. . Please call me if any further questions or concerns. Will continue to participate in the care of this patient.    HPI:     no new complaints. Patient denies any subjective fever, chills at home.  She denies any increasing chest pain, shortness of breath, neck pain.  Past Medical History:   Diagnosis Date    Anemia     Arthritis     Chronic pain     Legs and Shoulders    Diabetes (HCC)     Exposure to asbestos     History of blood transfusion     History of colon polyps     HTN (hypertension)     Hx of blood clots     DVT    Hyperlipemia     Menopause     Pulmonary nodule     Serum calcium elevated     Sleep apnea     not using cpap    Stroke [facility])        Past Surgical History:   Procedure Laterality Date    CESAREAN SECTION  1987    COLONOSCOPY N/A 01/13/2017    COLONOSCOPY performed by Levan Hurst, MD at [facility]COPY    COLONOSCOPY      DILATION AND CURETTAGE OF UTERUS  1995    RECTAL SURGERY N/A 12/28/2021    SACRAL WOUND DEBRIDEMENT INCISION AND DRAINAGE performed by Joni Reining  Yeshtokin, DO at [facility] MAIN OR    SPINE SURGERY N/A 12/09/2021    CERVICAL THREE/FOUR/FIVE/SIX LAMINECTOMY FUSION; C-ARM; STRYKER; EXT BONE GROWTH STIM; 23 HR performed by Pamalee Leyden, MD at [facility] MAIN OR       [email or web address] @    Current Facility-Administered Medications   Medication Dose Route Frequency    insulin lispro (HUMALOG) injection vial 4 Units  4 Units SubCUTAneous TID WC    hydrALAZINE (APRESOLINE) tablet 10 mg  10 mg Oral Q6H PRN    vancomycin (VANCOCIN) 750 mg in sodium chloride 0.9 % 250 mL IVPB (vial-mate)  750 mg IntraVENous Q12H    ferrous sulfate (FE TABS 325) EC tablet 325 mg  325 mg Oral Daily with breakfast    insulin glargine (LANTUS) injection vial 32 Units  32 Units SubCUTAneous Daily    ondansetron (ZOFRAN-ODT) disintegrating tablet 4 mg  4 mg SubLINGual Q8H PRN    aspirin chewable tablet 81 mg  81 mg Oral Daily    atorvastatin (LIPITOR) tablet 80 mg  80 mg Oral Nightly     enoxaparin (LOVENOX) injection 40 mg  40 mg SubCUTAneous Daily    furosemide (LASIX) tablet 40 mg  40 mg Oral Daily    HYDROcodone-acetaminophen (NORCO) 5-325 MG per tablet 1 tablet  1 tablet Oral Q4H PRN    therapeutic multivitamin-minerals 1 tablet  1 tablet Oral Daily    glucose chewable tablet 16 g  4 tablet Oral PRN    dextrose bolus 10% 125 mL  125 mL IntraVENous PRN    Or    dextrose bolus 10% 250 mL  250 mL IntraVENous PRN    glucagon (rDNA) injection 1 mg  1 mg SubCUTAneous PRN    dextrose 10 % infusion   IntraVENous Continuous PRN    acetaminophen (TYLENOL) tablet 650 mg  650 mg Oral Q4H PRN    polyethylene glycol (GLYCOLAX) packet 17 g  17 g Oral Daily PRN    insulin lispro (HUMALOG) injection vial 0-8 Units  0-8 Units SubCUTAneous TID WC    insulin lispro (HUMALOG) injection vial 0-4 Units  0-4 Units SubCUTAneous Nightly    piperacillin-tazobactam (ZOSYN) 3,375 mg in sodium chloride 0.9 % 50 mL IVPB (mini-bag)  3,375 mg IntraVENous Q8H       Allergies: Lactose    Family History   Problem Relation Age of Onset    Diabetes Other 84        parent, NOS    Cancer Father     Hypertension Other 35        parent,NOS    Heart Disease Other 55        parent, NOS    Lung Disease Father      Social History     Socioeconomic History    Marital status: Single     Spouse name: Not on file    Number of children: Not on file    Years of education: Not on file    Highest education level: Not on file   Occupational History    Not on file   Tobacco Use    Smoking status: Every Day     Packs/day: 1.00     Types: Cigarettes, Cigars     Last attempt to quit: 08/2019     Years since quitting: 2.4    Smokeless tobacco: Never   Vaping Use    Vaping Use: Never used   Substance and Sexual Activity    Alcohol use: Yes  Comment: occ    Drug use: Never    Sexual activity: Not on file   Other Topics Concern    Not on file   Social History Narrative    Not on file     Social Determinants of Health     Financial Resource Strain: Low  Risk     Difficulty of Paying Living Expenses: Not hard at all   Food Insecurity: No Food Insecurity    Worried About Running Out of Food in the Last Year: Never true    Ran Out of Food in the Last Year: Never true   Transportation Needs: No Transportation Needs    Lack of Transportation (Medical): No    Lack of Transportation (Non-Medical): No   Physical Activity: Inactive    Days of Exercise per Week: 0 days    Minutes of Exercise per Session: 0 min   Stress: No Stress Concern Present    Feeling of Stress : Not at all   Social Connections: Moderately Isolated    Frequency of Communication with Friends and Family: Three times a week    Frequency of Social Gatherings with Friends and Family: More than three times a week    Attends Religious Services: More than 4 times per year    Active Member of Clubs or Organizations: No    Attends BankerClub or Organization Meetings: Never    Marital Status: Widowed   Catering managerntimate Partner Violence: Not At Risk    Fear of Current or Ex-Partner: No    Emotionally Abused: No    Physically Abused: No    Sexually Abused: No   Housing Stability: Low Risk     Unable to Pay for Housing in the Last Year: No    Number of Places Lived in the Last Year: 1    Unstable Housing in the Last Year: No     Social History     Tobacco Use   Smoking Status Every Day    Packs/day: 1.00    Types: Cigarettes, Cigars    Last attempt to quit: 08/2019    Years since quitting: 2.4   Smokeless Tobacco Never        Temp (24hrs), Avg:98.9 F (37.2 C), Min:98.8 F (37.1 C), Max:99.1 F (37.3 C)    BP (!) 146/60   Pulse 90   Temp 99.1 F (37.3 C) (Oral)   Resp 18   Ht 4\' 11"  (1.499 m)   SpO2 100%   BMI 31.69 kg/m     ROS: 12 point ROS obtained in details. Pertinent positives as mentioned in HPI,   otherwise negative    Physical Exam:    General:  Alert, cooperative, no distress, appears stated age.              Head: Normocephalic, without obvious abnormality, atraumatic.   Eyes:  Conjunctivae/corneas clear.  EOMs intact.   Nose: Nares normal. No drainage or sinus tenderness.   Neck: Supple, symmetrical, trachea midline, no, thyroid enlargement, no carotid bruit and no JVD.   Lungs:   Clear to auscultation bilaterally.   Heart:  Regular rate and rhythm, S1, S2 normal.      Abdomen: Soft, non-tender. Bowel sounds normal.    Extremities: Extremities normal, atraumatic, no cyanosis or edema.   Pulses: 2+ and symmetric all extremities.   Skin:  wound vac sacral decubiti not opened   Neurologic: AAOx3 No focal motor or sensory deficit.        Labs: Results:  Chemistry Recent Labs     01/14/22  0510   GLUCOSE 159*   NA 143   K 3.5   CL 112*   CO2 27   BUN 22*   CREATININE 0.82        CBC w/Diff No results for input(s): WBC, RBC, HGB, HCT, PLT in the last 72 hours.    Invalid input(s): GRANS, LYMPH, EOS     Microbiology Results       ** No results found for the last 336 hours. **                RADIOLOGY:    All available imaging studies/reports in connect care for this admission were reviewed      Disclaimer: Sections of this note are dictated utilizing voice recognition software, which may have resulted in some phonetic based errors in grammar and contents. Even though attempts were made to correct all the mistakes, some may have been missed, and remained in the body of the document. If questions arise, please contact our department.    Dr. Raiford Simmonds, Infectious Disease Specialist  [phone]  January 14, 2022  7:27 AM

## 2022-01-14 NOTE — Progress Notes (Addendum)
Comprehensive Nutrition Assessment    Type and Reason for Visit:  Reassess, Wound    Nutrition Recommendations/Plan:   Update food preferences in diet order  Continue supplement: Juven BID (95 kcal, 2.5 gm collaten protein each packet)  Continue all other nutrition interventions. Encourage/ monitor intake of meals and supplements.      Malnutrition Assessment:  Malnutrition Status:  At risk for malnutrition (Comment) (pt with pressure injury wound) (01/07/22 1349)    Context:  Acute Illness     Findings of the 6 clinical characteristics of malnutrition:  Energy Intake:  Mild decrease in energy intake (Comment)  Weight Loss:  No significant weight loss     Body Fat Loss:  Unable to assess     Muscle Mass Loss:  Unable to assess    Fluid Accumulation:  Mild    Grip Strength:  Not Performed    Nutrition Assessment:    Per pt and her daughter, pt with fair/good intake of meals; eating at least 50%, sometimes up to 90% of meals; mostly due to food preferences, which were discussed. Tolerating diet. Discussed adding glucerna shake nutrition supplement; pt declined. Pt likes/ consuming juven supplement. Also encouraged them to ask [facility] about meal/ food alternatives so that pt can receive a food item that she likes/ is consistent with diet order in order to encourage meal intake      Nutrition Related Findings:    BM 6/12.  Pertinent meds: ferrous sulfate, lasix, 32 unit lantus, SSI, lactobacillus, zosyn, MVI with minerals, vancomycin. Wound Type: Pressure Injury, Stage IV, Skin Tears, Deep Tissue Injury         Current Nutrition Intake & Therapies:    Average Meal Intake: 51-75%, 76-100%  Average Supplements Intake: 76-100%  ADULT DIET; Regular; 3 carb choices (45 gm/meal); Low Fat/Low Chol/High Fiber/2 gm Na; please provide sugar substitute only (no regular sugar)  ADULT ORAL NUTRITION SUPPLEMENT; Breakfast, Dinner; Wound Healing Oral Supplement    Anthropometric Measures:  Height: 4' 11" (149.9 cm)  Ideal  Body Weight (IBW): 95 lbs (43 kg)    Admission Body Weight: 156 lb 14.4 oz (71.2 kg)  Current Body Weight: 152 lb 1.6 oz (69 kg), 160.1 % IBW. Weight Source: Not Specified  Current BMI (kg/m2): 30.7  Usual Body Weight: 152 lb (68.9 kg)  % Weight Change (Calculated): 3.2  Weight Adjustment For: No Adjustment  BMI Categories: Obese Class 1 (BMI 30.0-34.9)    Estimated Daily Nutrient Needs:  Energy Requirements Based On: Formula (MSJ x1.2-1.4)  Weight Used for Energy Requirements: Current  Energy (kcal/day): 0923-3007  Weight Used for Protein Requirements: Current  Protein (g/day): 83-104 (wt x1.2-1.5)  Method Used for Fluid Requirements: 1 ml/kcal  Fluid (ml/day): 6226-3335    Nutrition Diagnosis:   Increased nutrient needs related to increase demand for energy/nutrients as evidenced by wounds    Nutrition Interventions:   Food and/or Nutrient Delivery: Continue Current Diet, Mineral Supplement, Vitamin Supplement, Continue Oral Nutrition Supplement  Nutrition Education/Counseling: Education not indicated  Coordination of Nutrition Care: Continue to monitor while inpatient  Plan of Care discussed with: pt and her daughter    Goals:  Previous Goal Met: Progressing toward Goal(s)  Goals: Meet at least 75% of estimated needs, by next RD assessment       Nutrition Monitoring and Evaluation:   Behavioral-Environmental Outcomes: None Identified  Food/Nutrient Intake Outcomes: Diet Advancement/Tolerance, Food and Nutrient Intake, Supplement Intake, Vitamin/Mineral Intake  Physical Signs/Symptoms Outcomes: Biochemical Data, Chewing or Swallowing,  GI Status, Fluid Status or Edema, Meal Time Behavior, Skin, Weight    Discharge Planning:    Continue current diet, Continue Oral Nutrition Supplement     [name] Short, [place]  Contact: [phone]

## 2022-01-14 NOTE — Plan of Care (Addendum)
Problem: Occupational Therapy - Adult  Goal: By Discharge: Performs self-care activities at highest level of function for planned discharge setting.  See evaluation for individualized goals.  Description: Occupational Therapy Goals   Long Term Goals  Initiated 01/07/2022 and to be accomplished within 2-3 week(s) 01/28/2022  1. Pt will perform self-feeding with ModI.  2. Pt will perform grooming with S/Setup  3. Pt will perform UB bathing with S/Setup  4. Pt will perform LB bathing with Mina.  5. Pt will perform tub/shower transfer with [facility].  6. Pt will perform UB dressing with S/Setup  7. Pt will perform LB dressing with Mina.  8. Pt will perform toileting task with [facility].  9. Pt will perform toilet transfer with [facility].   10.  Pt will perform an IADL task with good safety and Mina.      Short Term Goals   Initiated 01/07/2022 and to be accomplished within 7 day(s) 01/14/2022  1. Pt will perform self-feeding with Setup.  2. Pt will perform grooming with Setup.  3. Pt will perform UB bathing with Setup.  4. Pt will perform LB bathing with Moda.  5. Pt will perform tub/shower transfer with  Moda.  6. Pt will perform UB dressing with Mina.  7. Pt will perform LB dressing with Moda.  8. Pt will perform toileting task with Moda.  9. Pt will perform toilet transfer with Moda.       Occupational Therapy TREATMENT    Patient: [name] Short   66 y.o.    Patient identified with name and DOB: Yes     Date: 01/14/2022    First Tx Session  Time In:  1030  Time Out:  1130    Second Tx Session  Time In: 1130  Time Out: 1200    Diagnosis: Acute CVA (cerebrovascular accident) [facility]) [I63.9]   Precautions: Restrictions/Precautions: Fall Risk, Surgical Protocols (Cervical Spinal Precautions)  Spinal Precautions: No Bending, No Lifting, No Twisting      Chart, occupational therapy assessment, plan of care, and goals were reviewed.     Pain:  Pt reports 7/10 pain or discomfort prior to treatment.    Pt reports 7/10 pain or discomfort post  treatment.   Intervention Provided: N/A      SUBJECTIVE:   Patient stated "I am wet."    OBJECTIVE DATA SUMMARY:     THERAPEUTIC ACTIVITY Daily Assessment    To increase strength and problem solving for ADLs.  Pt participated in Wedron game with 2-1/2 weights at wrist in unsupported sitting.  Pt required moderate tactile cues to keep upright posture and maintaining spinal precautions.   Bicep burls and front raises with 2lb weights in hand in unsupported sitting.  Pt       THERAPEUTIC EXERCISE Daily Assessment    To increase strength for ADLs.  Pt propelled w/c from room with asst of IV pole and wound vac attached to w/c. Pt required multiple rest breaks and vcs for tech.      LOWER BODY DRESSING/UNDRESSING Daily Assessment   Functional Level Moderate assistance;Maximum assistance (asst to thread BLE through pants using reacher and pull up around hips in standing .)   Clinical Factors       TOILETING Pt required Moda to pull pants down, Maxa to pull up, and Moda for peri hygiene 2/2 to wound vac.    Functional Level     Clinical factors       TOILET TRANSFER  Daily Assessment  Transfer score  Toilet transfer with Mina-Moda.  Pt required asst   Comments   to manage IV,  wound vac connections, and increased time to transition      FUNCTIONAL MOBILITY Daily Assessment    Supine to EOB with Moda requiring asst at BLE u and vcs for log rolling tech.     WHEELCHAIR/BED TRANSFER INDEPENDENCE Daily Assessment   Transfer Technique    EOB to w/c with [facility] requiring increased time 2/2 to pain, IV, and wound vac   Level of Assistance     Equipment     Comments         ASSESSMENT:  Pt continues to be limited by progressing towards I with ADLs 2/2 to  frequency of incontinence of bowel/bladder and cervical spinal precautions.  Pt shows S/Setup with UB bathing/dressing, Moda -Maxa with LB bathing/dressing, and Mina with functional transfers  per 01/11/22 note.   Progression toward goals:  []           Improving appropriately and  progressing toward goals  []           Improving slowly and progressing toward goals  [x]           Not making progress toward goals and plan of care will be adjusted     PLAN:  Patient continues to benefit from skilled intervention to address the above impairments.  Continue treatment per established plan of care.  Discharge Recommendations:  Home Health vs SNF  Further Equipment Recommendations for Discharge:       Activity Tolerance:    Fair-    EDUCATION:   Education Given To: Patient  Education Provided: Precautions;Energy Conservation;Safety;Fall Prevention Strategies;Role of Therapy;Plan of Care;Transfer Training;Mobility Training  Education Method: Demonstration;Verbal  Barriers to Learning: Cognition  Education Outcome: Verbalized understanding;Continued education needed;Demonstrated understanding    COMMUNICATION:   []  Home safety education was provided and the patient/caregiver indicated understanding.  [x]  Patient/family have participated as able in goal setting and plan of care.  []  Patient/family agree to work toward stated goals and plan of care.  []  Patient understands intent and goals of therapy, but is neutral about his/her participation.  []  Patient is unable to participate in goal setting and plan of care.    Please refer to the flowsheet for vital signs taken during this treatment.  After treatment:   [x]   Patient left in no apparent distress sitting up in chair   []   Patient left in no apparent distress in bed  []   Call bell left within reach  []   Nursing notified  []   Caregiver present  []   Bed alarm activated    Estimated LOS: 2-3 weeks  IRF-PAI Completed: N/A  Entered Differentiated Treatment minutes: N/A    , OT

## 2022-01-14 NOTE — Progress Notes (Signed)
Patient presents today for biopsy of pigmented skin lesion.  The risk benefits and alternatives of the procedure were discussed prior to the visit.  Informed consent was signed and reviewed.  The right lower extremity was anesthetized with a proximal field block with 1.5 cc each of 2% lidocaine and 0.5% Marcaine plain.  He was prepped and draped with Betadine and sterile drape.  A 3 mm punch biopsy was used to biopsy the margin of the lesion with a section of normal-appearing skin.  The wound was irrigated and closed with Steri-Strips.  She tolerated the procedure well.  Specimen was sent to pathology.  Dry sterile dressings were applied.  She tolerated the biopsy well.   Lanae Crumbly, DPM 01/14/2022

## 2022-01-15 LAB — POCT GLUCOSE
POC Glucose: 168 mg/dL — ABNORMAL HIGH (ref 70–110)
POC Glucose: 123 mg/dL — ABNORMAL HIGH (ref 70–110)
POC Glucose: 184 mg/dL — ABNORMAL HIGH (ref 70–110)
POC Glucose: 142 mg/dL — ABNORMAL HIGH (ref 70–110)

## 2022-01-15 LAB — VANCOMYCIN LEVEL, RANDOM: Vancomycin Rm: 18 ug/mL (ref 5.0–40.0)

## 2022-01-15 MED ORDER — AMLODIPINE BESYLATE 5 MG PO TABS
5 MG | Freq: Every day | ORAL | Status: AC
Start: 2022-01-15 — End: 2022-01-21
  Administered 2022-01-15 – 2022-01-21 (×7): 2.5 mg via ORAL

## 2022-01-15 MED FILL — HYDROCODONE-ACETAMINOPHEN 5-325 MG PO TABS: 5-325 MG | ORAL | Qty: 1 | Fill #0

## 2022-01-15 MED FILL — THERAPEUTIC-M PO TABS: ORAL | Qty: 1 | Fill #0

## 2022-01-15 MED FILL — NORVASC 5 MG PO TABS: 5 MG | ORAL | Qty: 1

## 2022-01-15 MED FILL — PIPERACILLIN SOD-TAZOBACTAM SO 3.375 (3-0.375) G IV SOLR: 3.375 (3-0.375) g | INTRAVENOUS | Qty: 3375 | Fill #0

## 2022-01-15 MED FILL — FERROUS SULFATE 325 (65 FE) MG PO TBEC: 325 (65 Fe) MG | ORAL | Qty: 1 | Fill #0

## 2022-01-15 MED FILL — VANCOMYCIN HCL 750 MG IV SOLR: 750 MG | INTRAVENOUS | Qty: 750 | Fill #0

## 2022-01-15 MED FILL — HUMALOG 100 UNIT/ML IJ SOLN: 100 UNIT/ML | INTRAMUSCULAR | Qty: 4

## 2022-01-15 MED FILL — HUMALOG 100 UNIT/ML IJ SOLN: 100 UNIT/ML | INTRAMUSCULAR | Qty: 4 | Fill #0

## 2022-01-15 MED FILL — LOVENOX 40 MG/0.4ML IJ SOSY: 40 MG/0.4ML | INTRAMUSCULAR | Qty: 0.4 | Fill #0

## 2022-01-15 MED FILL — FUROSEMIDE 40 MG PO TABS: 40 MG | ORAL | Qty: 1 | Fill #0

## 2022-01-15 MED FILL — LANTUS 100 UNIT/ML SC SOLN: 100 UNIT/ML | SUBCUTANEOUS | Qty: 32 | Fill #0

## 2022-01-15 MED FILL — BIO-K PLUS STRONG PO CPDR: ORAL | Qty: 1

## 2022-01-15 MED FILL — ASPIRIN LOW DOSE 81 MG PO CHEW: 81 MG | ORAL | Qty: 1 | Fill #0

## 2022-01-15 MED FILL — ATORVASTATIN CALCIUM 40 MG PO TABS: 40 MG | ORAL | Qty: 2 | Fill #0

## 2022-01-15 NOTE — Plan of Care (Addendum)
 Problem: SLP Adult - Disturbed Thought Process  Goal: By Discharge: Demonstrates cognitive skills at highest level of function for planned discharge setting.   See evaluation for individualized goals.  Description: Long term goals (Initiated 01/07/22 to be accomplished by 01/21/2022)  Patient will:  1. Be oriented x 3 and recall events of the day, supervision.  2. Follow 3 part and complex commands, 90% accuracy.  3.  Listen to 3-4 sentence length paragraphs and respond to questions re: content with 90% accuracy.  4.  List 10-12 items in categories of increasing abstraction, supervision.  5.  Discuss similarities/differences given 2 items, 80-90% accuracy.    Short term goals (by 01/21/22)  Patient will:  1.  Be oriented x 3 and recall events of the day, min assist.  2.  Follow 3 part and complex commands, 70-60% accuracy.  3.  Listen to 3-4 sentence length paragraphs and respond to questions re: content with 70-80% accuracy.  4.  List 10-12 items in categories of increasing abstraction, min assist.  5.  Discuss similarities/differences given 2 items, 70-80% accuracy.      Note:   SPEECH-LANGUAGE TREATMENT    Patient: [name] Short (66 y.o. female)  Date: 01/15/2022  Diagnosis: Acute CVA (cerebrovascular accident) [facility]) [I63.9] Acute CVA (cerebrovascular accident) (HCC)      Precautions: Standard  PLOF: As per H&P     ASSESSMENT:  Patient was pleasant and participated in all tasks.   Progression toward goals:  [x]        Improving appropriately and progressing toward goals  []        Improving slowly and progressing toward goals  []        Not making progress toward goals and plan of care will be adjusted     PLAN:  Patient continues to benefit from skilled intervention to address the above impairments.  Continue treatment per established plan of care.     SUBJECTIVE:   Patient stated "sit right in front of me ".    OBJECTIVE:   Mental Status:  Patient was alert and focused  Treatment &  Interventions:    Neuro-Linguistics:   Orientation:    Independent  Recent recall:    Independent  3 word delayed recall:   Independent  Categories-    Presidents- listed 12 independently        Plants- listed 14 independently        Vacation destinations- listed 12 independently  Similarities/differences:  100% independently  Answering questions in paragraphs: 9/11 independently      Response & Tolerance to Activities:   Patient is positive and give maximum effort to therapy.     PAIN:  Start of Tx: apparent but no report   End of Tx: apparent but no report     After treatment:   [x]        Patient left in no apparent distress sitting up in chair  []        Patient left in no apparent distress in bed  [x]        Call bell left within reach  []        Nursing notified  [x]        Caregiver present  []        Bed alarm activated      COMMUNICATION/EDUCATION:   [x]  Patient educated regarding compensatory speech/language/comprehension techniques provided via demonstration, verbalization and teach back of comprehension  [x]  Patient/family have participated as able in goal setting and plan of care.  [x]  Patient/family  agree to work toward stated goals and plan of care.  []  Patient understands intent and goals of therapy, neutral about participation.  []  Patient unable to participate in goal setting/plan of care secondary to cognition, hearing/vision deficits; education ongoing with interdisciplinary staff       HUNTER BOWEN

## 2022-01-15 NOTE — Wound Image (Signed)
Room #: 182      HAR: [id number]      Situation: Wound Care Consult    Background:    PMH:   Active Ambulatory Problems     Diagnosis Date Noted    Iron deficiency anemia due to chronic blood loss 12/21/2016    Diabetes ([place])     Occult blood in stools 12/21/2016    Stroke ([place]) 04/23/2013    Symptomatic anemia 12/20/2016    Cervical myelopathy ([place]) 09/23/2021    Obstructive sleep apnea syndrome 09/23/2021    Spinal stenosis of lumbar region 09/23/2021    Syncope 09/08/2021    Cerebrovascular accident ([place]) 09/23/2021    Fall 12/27/2021    Failure to thrive in adult 12/27/2021    Skin ulcer of sacrum ([place]) 12/28/2021    RUQ pain 12/28/2021    Decubitus ulcer of sacral region, stage 4 ([place]) 12/28/2021    Lacunar infarct, acute ([place]) 12/31/2021     Resolved Ambulatory Problems     Diagnosis Date Noted    No Resolved Ambulatory Problems     Past Medical History:   Diagnosis Date    Anemia     Arthritis     Chronic pain     Exposure to asbestos     History of blood transfusion     History of colon polyps     HTN (hypertension)     Hx of blood clots     Hyperlipemia     Menopause     Pulmonary nodule     Serum calcium elevated     Sleep apnea         Braden Score: 21/23   BMI: Body mass index is 30.72 kg/m.   Preventive measures in place: limited layers, mobilization    Assessment:   Patient found reclined.  Patient is Awake and alert, Oriented x person, place, time and situation, and Pleasant and conversant.     Wound(s) Description:           Negative Pressure Wound Therapy Sacrum Posterior (Active)   $ Standard NPWT >50 sq cm PER TX $ Yes 01/15/22 1609   Wound Type Pressure ulcer: Stage IV 01/15/22 1609   Unit Type Ulta 01/15/22 1609   Dressing Type Black Foam 01/15/22 1609   Number of pieces used 1 01/15/22 1609   Number of pieces removed 1 01/14/22 2008   Cycle Continuous 01/15/22 1609   Target Pressure (mmHg) 125 01/15/22 1609   Canister changed? No 01/15/22 1609   Dressing Status New dressing applied 01/15/22  1609   Dressing Changed Changed/New 01/15/22 1609   Drainage Amount Moderate 01/15/22 1609   Drainage Description Serosanguinous 01/15/22 1609   Dressing Change Due 01/17/22 01/15/22 1609   Output (ml) 100 ml 01/14/22 1925   Wound Assessment Bleeding 01/14/22 1925   Peri-wound Assessment Fragile 01/14/22 2008   Shape 9 cm. long , depth 5 cm sacrum 01/06/22 1347   Odor None 01/15/22 1609   Number of days: 15       Wound 12/16/21 Buttocks Right Right buttocks, pressure injury, POA. (Active)   Wound Image   01/15/22 1608   Wound Etiology Pressure Stage 4 01/15/22 1608   Dressing Status New dressing applied 01/15/22 1608   Wound Cleansed Wound cleanser 01/15/22 1608   Dressing/Treatment Negative pressure wound therapy 01/15/22 1608   Dressing Change Due 01/17/22 01/15/22 1608   Wound Length (cm) 7 cm 01/15/22 1608   Wound Width (cm) 9 cm  01/15/22 1608   Wound Depth (cm) 3 cm 01/15/22 1608   Wound Surface Area (cm^2) 63 cm^2 01/15/22 1608   Wound Volume (cm^3) 189 cm^3 01/15/22 1608   Wound Assessment Slough;Granulation tissue 01/15/22 1608   Drainage Amount Moderate 01/15/22 1608   Drainage Description Serosanguinous 01/15/22 1608   Odor None 01/15/22 1608   Peri-wound Assessment Hypopigmented;Intact 01/15/22 1608   Number of days: 30       Wound 12/31/21 Sacrum Mid Sacrum, pressure injury, POA. (Active)   Wound Etiology Pressure Stage 4 01/14/22 2008   Dressing Status Clean;Dry;Intact;Reinforced dressing 01/14/22 2008   Wound Cleansed Not Cleansed 01/14/22 2008   Dressing/Treatment Negative pressure wound therapy 01/14/22 2008   Offloading for Diabetic Foot Ulcers Offloading ordered 01/14/22 2008   Dressing Change Due 01/15/22 01/14/22 2008   Wound Length (cm) 9.5 cm 01/10/22 0652   Wound Width (cm) 8 cm 01/10/22 0652   Wound Depth (cm) 5 cm 01/10/22 0652   Wound Surface Area (cm^2) 76 cm^2 01/10/22 0652   Change in Wound Size % (l*w) 0 01/10/22 0652   Wound Volume (cm^3) 380 cm^3 01/10/22 0652   Wound Healing % 0  01/10/22 0652   Wound Assessment Pink/red 01/14/22 2008   Drainage Amount Small 01/14/22 2008   Drainage Description Serosanguinous 01/14/22 2008   Odor None 01/14/22 2008   Margins Attached edges 01/10/22 0652   Wound Thickness Description not for Pressure Injury Full thickness 01/14/22 2008   Number of days: 15       Wound 12/31/21 Heel Right;Lateral Right lateral heel, pressure injury, POA. (Active)   Wound Etiology Deep tissue/Injury 01/14/22 0736   Dressing/Treatment Open to air 01/14/22 2008   Wound Assessment Eschar dry 01/14/22 2008   Drainage Amount None 01/14/22 2008   Odor None 01/14/22 2008   Number of days: 15       Wound 12/31/21 Heel Left;Lateral Left lateral heel, pressure injury, POA. (Active)   Wound Etiology Deep tissue/Injury 01/14/22 2008   Dressing Status Clean;Dry;Intact 01/14/22 2008   Dressing/Treatment Open to air 01/14/22 2008   Wound Assessment Eschar dry 01/14/22 2008   Drainage Amount None 01/14/22 2008   Odor None 01/14/22 2008   Number of days: 15                Recommendations:    Wound care orders as follows: use hydrocolloid peri-wound (some strips left in room) and a thin roll of ostomy barrier ring right above the anus to help secure/seal dressing.       Supplies Used: Vac dressing kit, hydrocolloid, skin prep, barrier ring         Care discussed with primary nurse, [place].   Care turned over to nursing staff at this time.    Velora Heckler. Cobbs BSN, RN, [facility], [place] [facility]  [phone]

## 2022-01-15 NOTE — Progress Notes (Signed)
Pt shared her willingness to consider SNF at dc but understands the parameters surrounding SNF admission and insurance approval. Pt states that she will ask her daughter to call with the list of preferred facilities. Pt further clarifies that she does not financially qualify for medicaid.     Sw stayed with pt after reviewing conference goals per request as encouragement for PT session.     Sw will follow.

## 2022-01-15 NOTE — Progress Notes (Addendum)
[facility]  [address], [place], [place] [id number]     INPATIENT REHABILITATION  DAILY PROGRESS NOTE     Date: 01/15/2022    Name: [name] AppleHelena Kading Age / Sex: 66 y.o. 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  Patient is sitting in bed in no apparent distress.  Daughter is at bedside    Objective:     Vital Signs:  Patient Vitals for the past 24 hrs:   BP Temp Temp src Pulse Resp SpO2 Height   01/15/22 1611 (!) 154/75 97.6 F (36.4 C) Oral 75 18 -- --   01/15/22 1515 -- -- -- -- 18 -- --   01/15/22 0728 (!) 143/77 97.9 F (36.6 C) Oral 69 18 -- --   01/14/22 1930 (!) 156/71 98.8 F (37.1 C) Oral 89 20 100 % --   01/14/22 1733 -- -- -- -- -- -- 4\' 11"  (1.499 m)        Physical Examination:  General:  Awake, alert  Cardiovascular:  S1S2+, RRR  Pulmonary:  CTA b/l  GI:  Soft, BS+, NT, ND  Extremities:  + edema  Has sacral decub with wound VAC in place    Current Medications:  Current Facility-Administered Medications   Medication Dose Route Frequency    lactobacillus delayed release capsule 1 capsule  1 capsule Oral Daily with breakfast    insulin lispro (HUMALOG) injection vial 4 Units  4 Units SubCUTAneous TID WC    hydrALAZINE (APRESOLINE) tablet 10 mg  10 mg Oral Q6H PRN    vancomycin (VANCOCIN) 750 mg in sodium chloride 0.9 % 250 mL IVPB (vial-mate)  750 mg IntraVENous Q12H    ferrous sulfate (FE TABS 325) EC tablet 325 mg  325 mg Oral Daily with breakfast    insulin glargine (LANTUS) injection vial 32 Units  32 Units SubCUTAneous Daily    ondansetron (ZOFRAN-ODT) disintegrating tablet 4 mg  4 mg SubLINGual Q8H PRN    aspirin chewable tablet 81 mg  81 mg Oral Daily    atorvastatin (LIPITOR) tablet 80 mg  80 mg Oral  Nightly    enoxaparin (LOVENOX) injection 40 mg  40 mg SubCUTAneous Daily    furosemide (LASIX) tablet 40 mg  40 mg Oral Daily    HYDROcodone-acetaminophen (NORCO) 5-325 MG per tablet 1 tablet  1 tablet Oral Q4H PRN    therapeutic multivitamin-minerals 1 tablet  1 tablet Oral Daily    glucose chewable tablet 16 g  4 tablet Oral PRN    dextrose bolus 10% 125 mL  125 mL IntraVENous PRN    Or    dextrose bolus 10% 250 mL  250 mL IntraVENous PRN    glucagon (rDNA) injection 1 mg  1 mg SubCUTAneous PRN    dextrose 10 % infusion   IntraVENous Continuous PRN    acetaminophen (TYLENOL) tablet 650 mg  650 mg Oral Q4H PRN    polyethylene glycol (GLYCOLAX) packet 17 g  17 g Oral Daily PRN    insulin lispro (HUMALOG) injection vial 0-8 Units  0-8 Units SubCUTAneous TID WC    insulin lispro (HUMALOG) injection vial 0-4 Units  0-4 Units  SubCUTAneous Nightly    piperacillin-tazobactam (ZOSYN) 3,375 mg in sodium chloride 0.9 % 50 mL IVPB (mini-bag)  3,375 mg IntraVENous Q8H       Allergies:  Allergies   Allergen Reactions    Lactose Other (See Comments)     Intolerance-Pt denies       Lab/Data Review:  Recent Results (from the past 24 hour(s))   POCT Glucose    Collection Time: 01/14/22  8:32 PM   Result Value Ref Range    POC Glucose 168 (H) 70 - 110 mg/dL   Vancomycin Level, Random    Collection Time: 01/15/22  5:48 AM   Result Value Ref Range    Vancomycin Rm 18.0 5.0 - 40.0 UG/ML   POCT Glucose    Collection Time: 01/15/22  7:37 AM   Result Value Ref Range    POC Glucose 123 (H) 70 - 110 mg/dL   POCT Glucose    Collection Time: 01/15/22 11:51 AM   Result Value Ref Range    POC Glucose 184 (H) 70 - 110 mg/dL   POCT Glucose    Collection Time: 01/15/22  4:15 PM   Result Value Ref Range    POC Glucose 142 (H) 70 - 110 mg/dL         Assessment:     Primary Rehabilitation Diagnosis impaired mobility and ADLs in the setting of 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     Other Co-Morbid Conditions managed in Rehab   Cervical  spondylosis with myelopathy, status post laminectomy and fusion of C3-C6 and external bone growth stimulator placement on Dec 09, 2021  Infected sacral decubitus ulcer-status post debridement  Type 2 diabetes with hyperglycemia  Hypertension  Hypomagnesemia  Anemia    PLAN  -impaired mobility and ADLs in the setting of 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   Aspirin and statin  PT OT and speech therapy     -Cervical spondylosis with myelopathy, status post laminectomy and fusion of C3-C6 and external bone growth stimulator placement on Dec 09, 2021-PT and OT.  Judicious pain control.  Bowel regimen.  Patient states she did have a BM earlier today        -Infected sacral decubitus ulcer-status post debridement.  Continue wound care and IV antibiotics.  Routine PICC line care.  Continue wound care and wound VAC.  On probiotics     -Type 2 diabetes with hyperglycemia.  Continue Lantus insulin and prandial insulin and sliding scale insulin.  Monitor sugars  On January 09, 2022 I increased Lantus from 30 units to 32 units daily, monitor.  Continue current regimen  On January 11, 2022 I have increased the prandial insulin from 3 units 3 times daily to 4 units 3 times daily with meals  Some low sugars noted this morning.  Will decrease Lantus back to 30 units subcu daily.    Continue to monitor sugars  Continue Lantus 32 units subcu daily     -Hypertension-blood pressure is trending up.  Add as needed hydralazine  Blood pressure is trending high.  On January 13, 2022 I have added amlodipine 2.5 mg p.o. daily.    Monitor blood pressure       -Hypomagnesemia-repleted    -Hypokalemia  On January 07, 2022 potassium was 3.0.  Repleted  On January 08, 2022 potassium is 3.7  On January 09, 2022 potassium was 3.6  On January 10, 2022 potassium is 3.6  On January 14, 2022 potassium is  3.5     -Anemia-continue ferrous sulfate, monitor  On January 07, 2022 hemoglobin is 8.3  On January 14, 2022 hemoglobin is 8.4    -Hypercalcemia, primary  hyperparathyroidism  On January 07, 2022 calcium was 10.6  On January 08, 2022 calcium is 60  On January 09, 2022 calcium is 10.9  On January 10, 2022 calcium is 10.5  On January 14, 2022 calcium is 10.2  Patient follows with endocrinologist as an outpatient     Fall precautions, aspiration precautions  I discussed with patient and daughter at bedside.  I discussed with RN    Functional Progress:    PHYSICAL THERAPY    ON ADMISSION MOST RECENT   Balance  Balance  Posture: Poor  Sitting - Static: Good  Sitting - Dynamic: Fair  Standing - Static: Fair, -  Standing - Dynamic: Poor, +  Poor  Good  Fair  Fair;-  Poor;+      Balance  Balance  Posture: Poor  Sitting - Static: Good  Sitting - Dynamic: Fair  Standing - Static: Fair, -  Standing - Dynamic: Poor, +  Posture: Poor  Sitting - Static: Good  Sitting - Dynamic: Fair  Standing - Static: Fair;-  Standing - Dynamic: Poor;+        Bed Mobility  Maximum assistance  Maximum assistance  Moderate assistance (Pt requird increased encouragement and time to transition to seated position.)  Moderate Assistance  Maximum assistance  Moderate assistance    Bed Mobility  Rolling to Right: Minimal assistance, Moderate assistance  Rolling to Left: Minimal assistance, Moderate assistance  Supine to Sit: Stand by assistance (Pt used log roll (to the right) to get to EOB requiring increased time to complete.)  Sit to Stand: Contact guard assistance  Sit to Supine: Moderate assistance  Bed to Chair: Contact guard assistance      Wheelchair Mobility  Yes       Wheelchair Mobility  Propulsion: Yes         Ambulation  Rolling Walker  Moderate assistance  Level tile  12 Ambulation  Device: Rolling Walker  Assistance: Contact guard assistance  Surface: Level tile  Distance: 30 to 40 feet prior to requiring increased verbal and tactile cuing for scap add and upright posture with B UE support on RW for safety and maintaining spinal precautions.   Stairs  No  Left ascending (side stepping with B UE support on  hand rail)  Minimal assistance Stairs  Stairs?: Yes  Rails: Left ascending (side stepping with B UE support on hand rail)  Assistance: Minimal assistance     Functional Progress:    OCCUPATIONAL THERAPY    ON ADMISSION MOST RECENT   Eating  Supervision Eating  Feeding: Supervision   Grooming  Supervision, Setup (sitting EOB) Grooming  Grooming: Supervision, Setup   Bathing  UB Bathing Supervision, Setup (sitting EOB)  LB Bathing Maximum assistance Bathing  UB Bathing   UE Bathing: Supervision, Setup  LB Bathing   ZOXW([id number])@   Upper Body Dressing  Maximum assistance   Upper Body Dressing  UE Dressing: Supervision, Setup, Stand by assistance   Lower Body Dressing  Maximum assistance Lower Body Dressing  LE Dressing: Minimal assistance   Toileting  Maximum assistance Toileting  Toileting: Moderate assistance   Toilet Transfers  Moderate assistance Toilet Transfers  Toilet Transfer: Moderate assistance   Tub Transfers  Tub Transfers: Not tested  [employer] Transfers: Not tested Tub Transfers  Tub Transfers  Tub Transfers: Not tested  SHOWER Transfers  Tub Transfers  Tub Transfers: Not tested     Legend:   7 - Independent   6 - Modified Independent   5 - Standby Assistance / Supervision / Set-up   4 - Minimum Assistance / Contact Guard Assistance   3 - Moderate Assistance   2 - Maximum Assistance   1 - Total Assistance / Dependent             Plan:     1. Justification for continued stay: Fair progression towards established rehabilitation goals.      2. Medical Issues being followed closely:    [x]   Fall and safety precautions     [x]   Wound Care     [x]   Bowel and Bladder Function     [x]   Fluid Electrolyte and Nutrition Balance     [x]   Pain Control      3. Issues that 24 hour rehabilitation nursing is following:    [x]   Fall and safety precautions     [x]   Wound Care     [x]   Bowel and Bladder Function     [x]   Fluid Electrolyte and Nutrition Balance     [x]   Pain Control      [x]    Assistance with and education on in-room safety with transfers to and from the bed, wheelchair, toilet and shower.      4. Acute rehabilitation plan of care:    [x]   Continue current care and rehab.           [x]   Physical Therapy           [x]   Occupational Therapy           [x]   Speech Therapy     []   Hold Rehab until further notice     5. Medications:    [x]   MAR Reviewed     [x]   Continue Present Medications       6. Chemical DVT Prophylaxis:      [x]   Enoxaparin     []   Unfractionated Heparin     []   Warfarin     []   NOAC     []   Aspirin     []   None     7. Mechanical DVT Prophylaxis:      [x]   TED Stockings     []   Sequential Compression Device     []   None     8. GI Prophylaxis:      []   PPI     []   H2 Blocker     [x]   None / Not indicated     9. Code status:Full    Dragon medical dictation software was used for portions of this report. Unintended errors may occur.      Signed:    , MD      January 15, 2022

## 2022-01-15 NOTE — Progress Notes (Signed)
Pharmacy Pharmacokinetic Monitoring Service - Vancomycin    Consulting Provider: Jonah Blue, MD   Indication:  infected sacral decub.   Target Concentration: Goal AUC/MIC 400-600 mg*hr/L  Day of Therapy (Rehab): 10 of 31 (through 02/06/22 as per ID recommendations).  Additional Antimicrobials: Piperacillin/Tazobactam    Pertinent Laboratory Values:   Temp: 97.7 F (36.5 C)        Labs Renal Latest Ref Rng & Units 01/14/2022 01/10/2022 01/09/2022 01/08/2022 01/07/2022   BUN 7.0 - 18 MG/DL 94(R) 74(Y) 81(K) 48(J) 20(H)   Cr 0.6 - 1.3 MG/DL 8.56 3.14 9.70 2.63 7.85   K 3.5 - 5.5 mmol/L 3.5 3.6 3.6 3.7 3.0(L)   Na 136 - 145 mmol/L 143 145 143 142 144        Estimated Creatinine Clearance: 63 mL/min (based on SCr of 0.82 mg/dL).    Pertinent Cultures:  Culture Date Source Results   05/27 Blood x 2 No growth   05/27 Wound, Sacral Proteus mirabilis (sens to Zosyn)  Enterococcus faecalis (sens to Vanc)     MRSA Nasal Swab: N/A. Non-respiratory infection    Assessment:  Date/Time Current Dose Concentration Timing of Concentration (h) AUC   6/05 1000 mg q12h 17.4 9.2h 415   6/06 1000 mg q12h - - -   06/07 " "      06/08 " "      06/09 " " 27.5 6 634   06/10  750 mg q12h - - -   06/11 " " 16.6 7 468   06/12 " " - - -   06/13 " " - - -   06/14 " " 18 7.5 480   Note: Serum concentrations collected for AUC dosing may appear elevated if collected in close proximity to the dose administered, this is not necessarily an indication of toxicity    Plan:  Continue Vancomycin 750 mg IV q12h   Renal labs as indicated.  Pharmacy will continue to monitor patient and adjust therapy as indicated

## 2022-01-15 NOTE — Plan of Care (Signed)
Problem: Occupational Therapy - Adult  Goal: By Discharge: Performs self-care activities at highest level of function for planned discharge setting.  See evaluation for individualized goals.  Description: Occupational Therapy Goals   Long Term Goals  Initiated 01/07/2022 and to be accomplished within 2-3 week(s) 01/28/2022  1. Pt will perform self-feeding with ModI.  2. Pt will perform grooming with S/Setup  3. Pt will perform UB bathing with S/Setup  4. Pt will perform LB bathing with Mina.  5. Pt will perform tub/shower transfer with [facility].  6. Pt will perform UB dressing with S/Setup  GM 01/15/22  7. Pt will perform LB dressing with Mina.  Continue for consistency  8. Pt will perform toileting task with [facility]. Continue for consistency  9. Pt will perform toilet transfer with [facility]. GM 01/15/22  10.  Pt will perform an IADL task with good safety and Mina.      Short Term Goals   Initiated 01/07/2022 and to be accomplished within 7 day(s) 01/14/2022; update 01/15/22  1. Pt will perform self-feeding with Setup.  GM 01/15/22  2. Pt will perform grooming with Setup.  GM 01/15/22  3. Pt will perform UB bathing with Setup. GM 01/15/22  4. Pt will perform LB bathing with Moda.  GM 01/15/22  5. Pt will perform tub/shower transfer with  [place]a. To be addressed at a later date  6. Pt will perform UB dressing with Mina.  GM 01/15/22  7. Pt will perform LB dressing with Moda. GM 01/15/22  8. Pt will perform toileting task with [facility]a. GM 01/15/22  9. Pt will perform toilet transfer with [place].GM 01/15/22         Outcome: Progressing   OT WEEKLY PROGRESS NOTE  Patient Name:[name] Short    First Treatment Session  Time In:    Time Out:      Medical Diagnosis:  Acute CVA (cerebrovascular accident) (HCC) [I63.9] Acute CVA (cerebrovascular accident) [facility])     Precautions: Fall Risk, Surgical Protocols (Cervical Spinal Precautions),  ,  ,  ,  , Spinal Precautions: No Bending, No Lifting, No Twisting,  ,      Pain at start of tx:7/10 pain or  discomfort.  Pain at stop of tx:7/10 pain or discomfort.    Patient identified with name and DOB:Yes   Subjective: " I have to wash my ears"         Objective: Self Care and Functional Transfers    Outcome Measures:    AROM: WFL    COGNITION/PERCEPTION Initial Assessment Weekly Progress Assessment 01/15/2022   Overall orientation status Within Functional Limits  WFL   Orientation level Oriented X4     Overall cognitive status WFL     Cognition comment       Vision - Basic assessment       Perception       Overall sensation status Impaired       EATING Initial Assessment Weekly Progress Assessment 01/15/2022   Functional Level Supervision  S   Clinical factors         GROOMING Initial Assessment Weekly Progress Assessment 01/15/2022   Functional Level Supervision, Setup (sitting EOB) Grooming: Supervision;Setup   Clinical factors  Pt performed oral care at EOB with supervision and setup.       UPPER BODY BATHING Initial Assessment Weekly Progress Assessment 01/15/2022   Functional Level    Supervision, Setup (sitting EOB) UE Bathing: Supervision;Setup   Clinical Factors Pt performed UB bathing via sponge  bath at EOB with supervision with exception of washing sticky reside from leads of skin. UE Bathing Skilled Clinical Factors: Requires vcs to maintain spinal precautions and increased time to complete task. Pt thread BUE through arm of shirt and dress with S/setup EOB     LOWER BODY BATHING Initial Assessment Weekly Progress Assessment 01/15/2022   Functional Level    Maximum assistance LE Bathing: Minimal assistance   Clinical Factors   Pt requires an increased amount of time to complete 2/2 to stress incontinence of bladder requiring multiple changes. Pt able to perform peri wash but not buttock wash 2/2 to wound vac.  Ptcomplete washing BLE seated on bed using LHS LE Bathing Skilled Clinical Factors: Pt was able to wash Upper/lower leg and feet  seated EOB with setup requiring increased time. Pt  washed front peri  standing with SBA and unable to wash  buttocks thoroughly 2/2 to seal from wound vac. However, pt instructed/guided by tactile cues to reach behind and try to maintain ROM for tasks.     TUB/SHOWER TRANSFER INDEPENDENCE Initial Assessment Weekly Progress Assessment 01/15/2022   Tub/Shower Transfer Score Not tested   Not addressed   Tub Transfer Comments       Shower Transfer Score: Not tested     Shower Transfer Comments         UPPER BODY DRESSING/UNDRESSING Initial Assessment Weekly Assessment 01/15/2022   Functional Level Maximum assistance UE Dressing: Supervision;Setup;Stand by assistance   Clinical Factors Pt performed UB dressing at EOB with supervision and setup and min VC's for sequencing and problem solving. UE Dressing Skilled Clinical Factors: Increased time to complet task     LOWER BODY DRESSING/UNDRESSING Initial Assessment Discharge Assessment 01/15/2022   Functional Level Maximum assistance LE Dressing: Minimal assistance   Clinical Factors Pt performed LB dressing at EOB with sock aid to don BLE socks and supervision. Pt thread BLE feet through brief and pants using long handled reacher and pulled up over buttocks and BLE hips with modA from bed level. LE Dressing Skilled Clinical Factors: Pt doffed sock with reacher/donned with sock aide and thread BLE through brief with setup seated on bed side commode. Both tasks rquired increased time.  Pt reports wearing pants rarely at home and showed a substantial increase in LB dressing without pants.     TOILETING Initial Assessment Weekly Progress Assessment 01/15/2022   Functional Level Maximum assistance Toileting: Moderate assistance   Clinical factors Pt declined.       TOILET TRANSFER INDEPENDENCE Initial Assessment Weekly Progress Assessment 01/15/2022   Transfer score Moderate assistance Toilet Transfer: Moderate assistance   Tub transfer comments                ASSESSMENT:  Pt starting to show consistent progress with bathing,dressing, and functional  transfers.  Pt noted to report she does not wear pants and demonstrate increase I with dressing (LB dressing with socks/brief and donning dress over head). However,pt continue to require increased time to complete tasks  and vcs for maintaining cervical neck precautions.     Progression toward goals:  []           Improving appropriately and progressing toward goals  [x]           Improving slowly and progressing toward goals  []           Not making progress toward goals and plan of care will be adjusted     PLAN:  Patient continues to benefit from skilled  intervention to address the above impairments.  Continue treatment per established plan of care.  Discharge Recommendations:   SNF  Further Equipment Recommendations for Discharge:     N/A     Please refer to the flow sheet for vital signs taken during this treatment.  After treatment:   []   Patient left in no apparent distress sitting up in chair  [x]   Patient left in no apparent distress in bed  []   Call bell left within reach  []   Nursing notified  []   Caregiver present  []   Bed alarm activated    COMMUNICATION/EDUCATION:   []  Home safety education was provided and the patient/caregiver indicated understanding.  [x]  Patient/family have participated as able in goal setting and plan of care.  []  Patient/family agree to work toward stated goals and plan of care.  []  Patient understands intent and goals of therapy, but is neutral about his/her participation.  []  Patient is unable to participate in goal setting and plan of care.    Education: Education Given To: Patient  Education Provided: Precautions;Energy Conservation;Safety;Fall Prevention Strategies;Role of Therapy;Plan of Care;Transfer Training;Mobility Training  Education Method: Demonstration;Verbal  Barriers to Learning: Cognition  Education Outcome: Verbalized understanding;Continued education needed;Demonstrated understanding         Plan of Care: Please see Care Plan for updated STG/LTGs.   Estimated LOS:  2-3 weeks  Entered Differentiated Treatment minutes:     , OT  01/15/2022

## 2022-01-15 NOTE — Progress Notes (Signed)
INTERDISCIPLINARY TEAM FOLLOW-UP  Patient: [name] Short (66 y.o. female)  Date: 01/15/2022  Diagnosis: Acute CVA (cerebrovascular accident) [facility]) [I63.9] Acute CVA (cerebrovascular accident) [facility])      Precautions:      Met with patient to discuss the findings from 01/15/2022 Team Conference.    Patient requested further information and/or had questions:   no       Lolita Cram

## 2022-01-15 NOTE — Plan of Care (Signed)
Problem: Physical Therapy - Adult  Goal: By Discharge: Performs mobility at highest level of function for planned discharge setting.  See evaluation for individualized goals.  Description: Physical Therapy Short Term Goals  Initiated 01/07/2022, reassessed 01/14/2022, and to be accomplished within 7 day(s) (01/14/2022)  1.  Patient will supine to sit, sit to supine, roll right, and roll left  in bed with moderate assistance .  MET 01/14/2022   Updated Goal: Patient will supine to sit, sit to supine, roll right, and roll left  in bed with minimal assistance.  2.  Patient will transfer from bed to chair and chair to bed with minimal assistance/contact guard assist using the least restrictive device.  MET 01/14/2022   Updated Goal:  Patient will transfer from bed to chair and chair to bed with supervision using the least restrictive device.  3.  Patient will perform sit to stand with minimal assistance/contact guard assist.  MET 01/14/2022   Updated Goal:  Patient will perform sit to stand with supervision.  4.  Patient will ambulate with minimal assistance/contact guard assist for 50 feet with the least restrictive device.  MET 01/14/2022   Updated Goal:  Patient will ambulate with supervision for 150 feet with the least restrictive device.   5.  Patient will ascend/descend 3 stairs with B handrail(s) with minimal assistance/contact guard assist.  Not Met - Ongoing    Physical Therapy Long Term Goals  Initiated 01/07/2022 and to be accomplished within 14-21 day(s) (01/28/2022)  1.  Patient will supine to sit, sit to supine, roll right, and roll left in bed with supervision/set-up.    2.  Patient will transfer from bed to chair and chair to bed with supervision/set-up using the least restrictive device.  3.  Patient will perform sit to stand with supervision/set-up.  4.  Patient will ambulate with supervision/set-up for 150 feet with the least restrictive device.   5.  Patient will ascend/descend 4 stairs with 1 handrail(s)  with supervision/set-up      Outcome: Progressing      PHYSICAL THERAPY TREATMENT    Patient: [name] Short (66 y.o. female)  Date: 01/15/2022  Diagnosis: Acute CVA (cerebrovascular accident) [facility]) [I63.9]   Precautions: Fall Risk, Cervical Spinal Precautions  Chart, physical therapy assessment, plan of care and goals were reviewed.    Time in: 1015  Time out : 1155    Patient seen for: Patient education, Transfer training, Balance training, Gait training    Pain:  Pt pain was reported as 7/10 pre-treatment.  Pt pain was reported as 7/10 post-treatment.  Intervention: Pt reported at PT's first attempt at 0930 while pt was eating breakfast that she was expecting pain medication from nursing staff.  Pt's nurse brought pain medication as PT was leaving room for pt to finish eating breakfast.  During treatment session, pt was assisted with multiple opportunities to change position and un-weight sacrum.    Patient identified with name and DOB: Yes    SUBJECTIVE:      Pt notes that she didn't feel like she did much during her previous day's PT treatment session.  Educated pt re: importance of progressing functional mobility and importance of mobility and increased independence with mobility to promote healing.    OBJECTIVE DATA SUMMARY:    Objective:   Education: Education Given To: Patient  Education Provided: Precautions;Energy Conservation;Safety;Fall Prevention Strategies;Role of Therapy;Plan of Media planner  Education Method: Demonstration;Verbal  Barriers to Learning: Cognition  Education Outcome: Verbalized understanding;Continued education  needed;Demonstrated understanding    TRANSFERS Daily Assessment    Sit to Stand Contact guard assistance    Transfer Assist Score Contact guard assistance    Comments Pt requires moderate to maximal education for safety with use of RW with pt initially attempting to pull up on RW with transfer from bed.  Pt requires increased time to initiate/perform  transfers demonstrating wide BOS with sit <> stand requiring maximal cues for anterior weight shift.  With stand to sit, pt requires maximal cues for hip flexion for slow controlled descent to decrease compensatory UE weight bearing on w/c hand rests or hand rests of bedside commode.        GAIT Daily Assessment    Gait Deviations Decreased step height;Decreased step length    Assistive device Rolling Walker    Ambulation assistance - surface  Contact guard assistance  Level tile    Distance 30 to 40 feet prior to requiring increased verbal and tactile cuing for scap add and upright posture with B UE support on RW for safety and maintaining spinal precautions.    Comments Pt requires increased time with slow cadence and decreased B foot clearance and step length with step to pattern with decreased right stance time and decreased right hip and knee flexion in swing phase requiring maximal cuing for hip and knee flexion.        STEPS/STAIRS Daily Assessment     Steps/Stairs ambulated 4  6"    Assistance Required Minimal assistance    Rail Use Right ascending (side stepping with B UE support on hand rail)    Comments Pt participated in pre-gait and stair training activity in front of mirror prior to participating in stair training performing 3 step up and downs with left LE leading on 6" step and B UE support on right and left handrails.  Pt then negotiated 4 (6") steps with B hands on right hand rail sidestepping with left LE leading ascending and right LE leading descending.  Pt requires minimal steadying assistance with maximal cuing for safety and maintaining spinal precautions.        BALANCE Daily Assessment    Posture Poor    Sitting - Static Good    Sitting - Dynamic Fair    Standing - Static Fair;-    Standing - Dynamic Poor;+      WHEELCHAIR MOBILITY/MANAGEMENT Daily Assessment   Able to Propel 150 Feet   Assist Level Supervision   Curbs/ramps assistance required  [place]   Wheelchair management manages B brakes  with brake extender   Comments Pt requires increased time to perform w/c propulsion at a distant supervision level.     ASSESSMENT:  Pt is progressing demonstrating ability to participate in stair training this treatmetn session.  Progression toward goals:  []       Improving appropriately and progressing toward goals  []       Improving slowly and progressing toward goals  []       Not making progress toward goals and plan of care will be adjusted      PLAN:  Patient continues to benefit from skilled intervention to address the above impairments.  Continue treatment per established plan of care. Emphasize functional strengthening and mobility to promote increased safety and independence upon d/c.  Discharge Recommendations:  Home with Home health PT;24 hour supervision or assist;Subacute/Skilled Nursing Facility  Further Equipment Recommendations for Discharge:        Rolling Walker  Estimated Discharge Date: 01/24/2022    Activity Tolerance:   Fair  Please refer to the flowsheet for vital signs taken during this treatment.    After treatment:   []  Patient left in no apparent distress in bed  [x]  Patient left in no apparent distress sitting up in chair  []  Patient left in no apparent distress in w/c mobilizing under own power  []  Patient left in no apparent distress dining area  []  Patient left in no apparent distress mobilizing under own power  [x]  Call bell left within reach  [x]  Nursing notified  []  Caregiver present  []  Bed alarm activated   [x]  Chair alarm activated      Rivka Barbara, PT, DPT  01/15/2022

## 2022-01-16 LAB — POCT GLUCOSE
POC Glucose: 194 mg/dL — ABNORMAL HIGH (ref 70–110)
POC Glucose: 109 mg/dL (ref 70–110)
POC Glucose: 116 mg/dL — ABNORMAL HIGH (ref 70–110)
POC Glucose: 166 mg/dL — ABNORMAL HIGH (ref 70–110)

## 2022-01-16 LAB — BASIC METABOLIC PANEL
Anion Gap: 3 mmol/L (ref 3.0–18)
BUN/Creatinine Ratio: 31 — ABNORMAL HIGH (ref 12–20)
BUN: 26 mg/dL — ABNORMAL HIGH (ref 7.0–18)
CO2: 28 mmol/L (ref 21–32)
Calcium: 10.6 mg/dL — ABNORMAL HIGH (ref 8.5–10.1)
Chloride: 113 mmol/L — ABNORMAL HIGH (ref 100–111)
Creatinine: 0.85 mg/dL (ref 0.6–1.3)
Est, Glom Filt Rate: >60 mL/min/{1.73_m2} (ref >60)
Glucose: 103 mg/dL — ABNORMAL HIGH (ref 74–99)
Potassium: 3.4 mmol/L — ABNORMAL LOW (ref 3.5–5.5)
Sodium: 144 mmol/L (ref 136–145)

## 2022-01-16 MED ORDER — POTASSIUM BICARB-CITRIC ACID 10 MEQ PO TBEF
10 MEQ | Freq: Once | ORAL | Status: AC
Start: 2022-01-16 — End: 2022-01-16
  Administered 2022-01-16: 22:00:00 40 meq via ORAL

## 2022-01-16 MED FILL — ATORVASTATIN CALCIUM 40 MG PO TABS: 40 MG | ORAL | Qty: 2 | Fill #0

## 2022-01-16 MED FILL — FUROSEMIDE 40 MG PO TABS: 40 MG | ORAL | Qty: 1

## 2022-01-16 MED FILL — PIPERACILLIN SOD-TAZOBACTAM SO 3.375 (3-0.375) G IV SOLR: 3.375 (3-0.375) g | INTRAVENOUS | Qty: 3375

## 2022-01-16 MED FILL — VANCOMYCIN HCL 750 MG IV SOLR: 750 MG | INTRAVENOUS | Qty: 750 | Fill #0

## 2022-01-16 MED FILL — LOVENOX 40 MG/0.4ML IJ SOSY: 40 MG/0.4ML | INTRAMUSCULAR | Qty: 0.4

## 2022-01-16 MED FILL — THERAPEUTIC-M PO TABS: ORAL | Qty: 1

## 2022-01-16 MED FILL — HYDROCODONE-ACETAMINOPHEN 5-325 MG PO TABS: 5-325 MG | ORAL | Qty: 1 | Fill #0

## 2022-01-16 MED FILL — HUMALOG 100 UNIT/ML IJ SOLN: 100 UNIT/ML | INTRAMUSCULAR | Qty: 4

## 2022-01-16 MED FILL — HYDROCODONE-ACETAMINOPHEN 5-325 MG PO TABS: 5-325 MG | ORAL | Qty: 1

## 2022-01-16 MED FILL — VANCOMYCIN HCL 750 MG IV SOLR: 750 MG | INTRAVENOUS | Qty: 750

## 2022-01-16 MED FILL — EFFER-K 10 MEQ PO TBEF: 10 MEQ | ORAL | Qty: 4

## 2022-01-16 MED FILL — ASPIRIN LOW DOSE 81 MG PO CHEW: 81 MG | ORAL | Qty: 1

## 2022-01-16 MED FILL — BIO-K PLUS STRONG PO CPDR: ORAL | Qty: 1

## 2022-01-16 MED FILL — FERROUS SULFATE 325 (65 FE) MG PO TBEC: 325 (65 Fe) MG | ORAL | Qty: 1

## 2022-01-16 MED FILL — LANTUS 100 UNIT/ML SC SOLN: 100 UNIT/ML | SUBCUTANEOUS | Qty: 32

## 2022-01-16 MED FILL — NORVASC 5 MG PO TABS: 5 MG | ORAL | Qty: 1

## 2022-01-16 NOTE — Plan of Care (Signed)
Problem: Occupational Therapy - Adult  Goal: By Discharge: Performs self-care activities at highest level of function for planned discharge setting.  See evaluation for individualized goals.  Description: Occupational Therapy Goals   Long Term Goals  Initiated 01/07/2022 and to be accomplished within 2-3 week(s) 01/28/2022  1. Pt will perform self-feeding with ModI.  2. Pt will perform grooming with S/Setup  3. Pt will perform UB bathing with S/Setup  4. Pt will perform LB bathing with Mina.  5. Pt will perform tub/shower transfer with [facility].  6. Pt will perform UB dressing with S/Setup  GM 01/15/22  7. Pt will perform LB dressing with Mina.  Continue for consistency  8. Pt will perform toileting task with [facility]. Continue for consistency  9. Pt will perform toilet transfer with [facility]. GM 01/15/22  10.  Pt will perform an IADL task with good safety and Mina.      Short Term Goals   Initiated 01/07/2022 and to be accomplished within 7 day(s) 01/14/2022; update 01/15/22  1. Pt will perform self-feeding with Setup.  GM 01/15/22  2. Pt will perform grooming with Setup.  GM 01/15/22  3. Pt will perform UB bathing with Setup. GM 01/15/22  4. Pt will perform LB bathing with [facility].  GM 01/15/22  5. Pt will perform tub/shower transfer with  [facility]. To be addressed at a later date  6. Pt will perform UB dressing with Mina.  GM 01/15/22  7. Pt will perform LB dressing with [facility]. GM 01/15/22  8. Pt will perform toileting task with [facility]. GM 01/15/22  9. Pt will perform toilet transfer with [facility].GM 01/15/22         Outcome: Progressing   Occupational Therapy TREATMENT    Patient: [name] Short   66 y.o.    Patient identified with name and DOB: Yes     Date: 01/16/2022    First Tx Session  Time In:  1130  Time Out:  1230    Second Tx Session  Time In: 1430  Time Out: 1530    Diagnosis: Acute CVA (cerebrovascular accident) [facility]) [I63.9]   Precautions: Restrictions/Precautions: Fall Risk, Surgical Protocols (Cervical Spinal Precautions)  Spinal Precautions:  No Bending, No Lifting, No Twisting      Chart, occupational therapy assessment, plan of care, and goals were reviewed.     Pain:  Pt reports 7/10 pain or discomfort prior to treatment at buttocks.   Pt reports 7/10 pain or discomfort post treatment at buttocks.   Intervention Provided: N/A      SUBJECTIVE:   Patient stated "I have to put on my compression stockings."    OBJECTIVE DATA SUMMARY:     THERAPEUTIC ACTIVITY Daily Assessment    Pt unbutton/button shirts x1 to increase strength/FM dexterity for ADLs.  Pt required increased time to complete task.      THERAPEUTIC EXERCISE Daily Assessment    Pt propelled w/c from room to gym with S.  Pt required vcs for upright cervical neck posture and increased time to complete task.   Pt reports neck feels upright straight but it is slanted to left.     Pt complete Large Peg task placed on a 65 degree incline to increase strength, FM dexterity, upright cervical posture for ADLs. 2lb weight applied to both wrists.    Pt propelled w/c from room <>gym with S.   Pt required increased time to complete task.    Theraflex bar (supination/pronation) 3x10.     TOILETING  Functional Level  Pt performed toileting with S requiring increased time to complete task.    Clinical factors       TOILET TRANSFER  Daily Assessment   Transfer score  Pt performed transfer from EOB to bedside commode with S.    Comments       FUNCTIONAL MOBILITY Daily Assessment         ASSESSMENT:  Pt increasing strength, FM dexterity, and endurance for ADLs.  Progression toward goals:  [x]           Improving appropriately and progressing toward goals  []           Improving slowly and progressing toward goals  []           Not making progress toward goals and plan of care will be adjusted     PLAN:  Patient continues to benefit from skilled intervention to address the above impairments.  Continue treatment per established plan of care.  Discharge Recommendations: SNF  Further Equipment Recommendations for  Discharge:  N/A     Activity Tolerance:    Fair     EDUCATION:   Education Given To: Patient  Education Provided: Precautions;Energy Conservation;Safety;Fall Prevention Strategies;Role of Therapy;Plan of Care;Transfer Training;Mobility Training  Education Method: Demonstration;Verbal  Barriers to Learning: Cognition  Education Outcome: Verbalized understanding;Demonstrated understanding;Continued education needed    COMMUNICATION:   []  Home safety education was provided and the patient/caregiver indicated understanding.  [x]  Patient/family have participated as able in goal setting and plan of care.  []  Patient/family agree to work toward stated goals and plan of care.  []  Patient understands intent and goals of therapy, but is neutral about his/her participation.  []  Patient is unable to participate in goal setting and plan of care.    Please refer to the flowsheet for vital signs taken during this treatment.  After treatment:   []   Patient left in no apparent distress sitting up in chair   [x]   Patient left in no apparent distress in bed  []   Call bell left within reach  []   Nursing notified  []   Caregiver present  []   Bed alarm activated    Estimated LOS: 2-3 weeks  IRF-PAI Completed: N/A  Entered Differentiated Treatment minutes: N/A    , OT

## 2022-01-16 NOTE — Progress Notes (Signed)
Pharmacy Pharmacokinetic Monitoring Service - Vancomycin    Consulting Provider: Jonah Blue, MD   Indication:  infected sacral decub.   Target Concentration: Goal AUC/MIC 400-600 mg*hr/L  Day of Therapy (Rehab): 11 of 31 (through 02/06/22 as per ID recommendations).  Additional Antimicrobials: Piperacillin/Tazobactam    Pertinent Laboratory Values:   Temp: 98.8 F (37.1 C), Weight - Scale: 152 lb 1.6 oz (69 kg)        Labs Renal Latest Ref Rng & Units 01/14/2022 01/10/2022 01/09/2022 01/08/2022 01/07/2022   BUN 7.0 - 18 MG/DL 81(W) 29(H) 37(J) 69(C) 20(H)   Cr 0.6 - 1.3 MG/DL 7.89 3.81 0.17 5.10 2.58   K 3.5 - 5.5 mmol/L 3.5 3.6 3.6 3.7 3.0(L)   Na 136 - 145 mmol/L 143 145 143 142 144        Estimated Creatinine Clearance: 63 mL/min (based on SCr of 0.82 mg/dL).    Pertinent Cultures:  Culture Date Source Results   05/27 Blood x 2 No growth   05/27 Wound, Sacral Proteus mirabilis (sens to Zosyn)  Enterococcus faecalis (sens to Vanc)     MRSA Nasal Swab: N/A. Non-respiratory infection    Assessment:  Date/Time Current Dose Concentration Timing of Concentration (h) AUC   6/05 1000 mg q12h 17.4 9.2h 415   6/06 1000 mg q12h - - -   06/07 " "      06/08 " "      06/09 " " 27.5 6 634   06/10  750 mg q12h - - -   06/11 " " 16.6 7 468   06/12 " " - - -   06/13 " " - - -   06/14 " " 18 7.5 480   Note: Serum concentrations collected for AUC dosing may appear elevated if collected in close proximity to the dose administered, this is not necessarily an indication of toxicity    Plan:  Continue current dose of 750 mg IV q12h  Renal labs as indicated   Vancomycin concentration ordered for AM labs 01/18/22  Pharmacy will continue to monitor patient and adjust therapy as indicated    Thank you for the consult,    Raeanne Gathers, [facility]  01/16/2022

## 2022-01-16 NOTE — Plan of Care (Addendum)
Problem: SLP Adult - Disturbed Thought Process  Goal: By Discharge: Demonstrates cognitive skills at highest level of function for planned discharge setting.   See evaluation for individualized goals.  Description: Long term goals (Initiated 01/07/22 to be accomplished by 01/21/2022)  Patient will:  1. Be oriented x 3 and recall events of the day, supervision.  2. Follow 3 part and complex commands, 90% accuracy.  3.  Listen to 3-4 sentence length paragraphs and respond to questions re: content with 90% accuracy.  4.  List 10-12 items in categories of increasing abstraction, supervision.  5.  Discuss similarities/differences given 2 items, 80-90% accuracy.    Short term goals (by 01/21/22)  Patient will:  1.  Be oriented x 3 and recall events of the day, min assist.  2.  Follow 3 part and complex commands, 70-60% accuracy.  3.  Listen to 3-4 sentence length paragraphs and respond to questions re: content with 70-80% accuracy.  4.  List 10-12 items in categories of increasing abstraction, min assist.  5.  Discuss similarities/differences given 2 items, 70-80% accuracy.      Note:   SPEECH-LANGUAGE TREATMENT    Patient: [name] Short (67 y.o. female)  Date: 01/16/2022  Diagnosis: Acute CVA (cerebrovascular accident) [facility]) [I63.9] Acute CVA (cerebrovascular accident) (HCC)      Precautions: Standard  PLOF: As per H&P     ASSESSMENT:  Patient was pleasant and participated in all tasks.  Progression toward goals:  [x]        Improving appropriately and progressing toward goals  []        Improving slowly and progressing toward goals  []        Not making progress toward goals and plan of care will be adjusted     PLAN:  Patient continues to benefit from skilled intervention to address the above impairments.  Continue treatment per established plan of care.     SUBJECTIVE:   Patient stated " I have to pee".    OBJECTIVE:   Mental Status:  Patient was alert and aware during today's session.   Treatment &  Interventions:  Neuro-Linguistics:   Orientation:     Independent  Recent recall:     Independent  Categories:     Things that are scary- listed 12 independently         Things that expire- Listed 10 independently  Following 3 part complex commands- 100% independently         Response & Tolerance to Activities:   Patient enjoys therapy and puts maximum effort into activities.     PAIN:  Start of Tx: no report  End of Tx: no report    After treatment:   [x]        Patient left in no apparent distress sitting up in chair  []        Patient left in no apparent distress in bed  [x]        Call bell left within reach  []        Nursing notified  []        Caregiver present  []        Bed alarm activated      COMMUNICATION/EDUCATION:   []  Patient educated regarding compensatory speech/language/comprehension techniques provided via demonstration, verbalization and teach back of comprehension  [x]  Patient/family have participated as able in goal setting and plan of care.  [x]  Patient/family agree to work toward stated goals and plan of care.  []  Patient understands intent and goals of therapy,  neutral about participation.  []  Patient unable to participate in goal setting/plan of care secondary to cognition, hearing/vision deficits; education ongoing with interdisciplinary staff       HUNTER BOWEN

## 2022-01-16 NOTE — Progress Notes (Signed)
[facility]  [address], [place], [place] [id number]     INPATIENT REHABILITATION  DAILY PROGRESS NOTE     Date: 01/16/2022    Name: [name] AppleHelena Short Age / Sex: 66 y.o. 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  Patient is sitting in a chair in no apparent distress.  Patient is awake and follows commands.    Objective:     Vital Signs:  Patient Vitals for the past 24 hrs:   BP Temp Temp src Pulse Resp SpO2   01/16/22 1630 (!) 160/72 97.7 F (36.5 C) Oral 69 16 100 %   01/16/22 1330 (!) 143/67 -- -- 74 -- --   01/16/22 0830 (!) 168/75 98.2 F (36.8 C) Oral 77 16 --   01/15/22 2117 -- -- -- -- 16 --   01/15/22 2030 (!) 146/62 98.8 F (37.1 C) Oral 79 16 100 %   01/15/22 1802 (!) 154/75 -- -- -- -- --        Physical Examination:  General:  Awake, alert  Cardiovascular:  S1S2+, RRR  Pulmonary:  CTA b/l  GI:  Soft, BS+, NT, ND  Extremities:  + edema.  I encouraged patient to wear graded compression stockings.  Patient states that she will let the occupational therapist put on the graded compression stockings.  Patient has a sacral decubitus ulcer with a wound VAC in place    Current Medications:  Current Facility-Administered Medications   Medication Dose Route Frequency    amLODIPine (NORVASC) tablet 2.5 mg  2.5 mg Oral Daily    lactobacillus delayed release capsule 1 capsule  1 capsule Oral Daily with breakfast    insulin lispro (HUMALOG) injection vial 4 Units  4 Units SubCUTAneous TID WC    hydrALAZINE (APRESOLINE) tablet 10 mg  10 mg Oral Q6H PRN    vancomycin (VANCOCIN) 750 mg in sodium chloride 0.9 % 250 mL IVPB (vial-mate)  750 mg IntraVENous Q12H    ferrous sulfate (FE TABS 325) EC tablet 325 mg  325 mg Oral Daily with  breakfast    insulin glargine (LANTUS) injection vial 32 Units  32 Units SubCUTAneous Daily    ondansetron (ZOFRAN-ODT) disintegrating tablet 4 mg  4 mg SubLINGual Q8H PRN    aspirin chewable tablet 81 mg  81 mg Oral Daily    atorvastatin (LIPITOR) tablet 80 mg  80 mg Oral Nightly    enoxaparin (LOVENOX) injection 40 mg  40 mg SubCUTAneous Daily    furosemide (LASIX) tablet 40 mg  40 mg Oral Daily    HYDROcodone-acetaminophen (NORCO) 5-325 MG per tablet 1 tablet  1 tablet Oral Q4H PRN    therapeutic multivitamin-minerals 1 tablet  1 tablet Oral Daily    glucose chewable tablet 16 g  4 tablet Oral PRN    dextrose bolus 10% 125 mL  125 mL IntraVENous PRN    Or    dextrose bolus 10% 250 mL  250 mL IntraVENous PRN    glucagon (rDNA) injection 1 mg  1 mg SubCUTAneous PRN    dextrose 10 % infusion   IntraVENous Continuous PRN    acetaminophen (TYLENOL) tablet 650  mg  650 mg Oral Q4H PRN    polyethylene glycol (GLYCOLAX) packet 17 g  17 g Oral Daily PRN    insulin lispro (HUMALOG) injection vial 0-8 Units  0-8 Units SubCUTAneous TID WC    insulin lispro (HUMALOG) injection vial 0-4 Units  0-4 Units SubCUTAneous Nightly    piperacillin-tazobactam (ZOSYN) 3,375 mg in sodium chloride 0.9 % 50 mL IVPB (mini-bag)  3,375 mg IntraVENous Q8H       Allergies:  Allergies   Allergen Reactions    Lactose Other (See Comments)     Intolerance-Pt denies       Lab/Data Review:  Recent Results (from the past 24 hour(s))   POCT Glucose    Collection Time: 01/15/22  8:46 PM   Result Value Ref Range    POC Glucose 194 (H) 70 - 110 mg/dL   Basic Metabolic Panel    Collection Time: 01/16/22  6:39 AM   Result Value Ref Range    Sodium 144 136 - 145 mmol/L    Potassium 3.4 (L) 3.5 - 5.5 mmol/L    Chloride 113 (H) 100 - 111 mmol/L    CO2 28 21 - 32 mmol/L    Anion Gap 3 3.0 - 18 mmol/L    Glucose 103 (H) 74 - 99 mg/dL    BUN 26 (H) 7.0 - 18 MG/DL    Creatinine 5.62 0.6 - 1.3 MG/DL    Bun/Cre Ratio 31 (H) 12 - 20      Est, Glom Filt Rate >60 >60  ml/min/1.75m2    Calcium 10.6 (H) 8.5 - 10.1 MG/DL   POCT Glucose    Collection Time: 01/16/22  7:24 AM   Result Value Ref Range    POC Glucose 109 70 - 110 mg/dL   POCT Glucose    Collection Time: 01/16/22 12:37 PM   Result Value Ref Range    POC Glucose 116 (H) 70 - 110 mg/dL   POCT Glucose    Collection Time: 01/16/22  4:40 PM   Result Value Ref Range    POC Glucose 166 (H) 70 - 110 mg/dL         Assessment:     Primary Rehabilitation Diagnosis impaired mobility and ADLs in the setting of 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     Other Co-Morbid Conditions managed in Rehab   Cervical spondylosis with myelopathy, status post laminectomy and fusion of C3-C6 and external bone growth stimulator placement on Dec 09, 2021  Infected sacral decubitus ulcer-status post debridement  Type 2 diabetes with hyperglycemia  Hypertension  Hypomagnesemia  Anemia  Hypokalemia    PLAN  -impaired mobility and ADLs in the setting of 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   Aspirin and statin  PT OT and speech therapy     -Cervical spondylosis with myelopathy, status post laminectomy and fusion of C3-C6 and external bone growth stimulator placement on Dec 09, 2021-PT and OT.  Judicious pain control.  Continue bowel regimen        -Infected sacral decubitus ulcer-status post debridement.  Continue wound care and IV antibiotics.  Routine PICC line care.  Continue wound care and wound VAC.  On probiotics     -Type 2 diabetes with hyperglycemia.  Continue Lantus insulin and prandial insulin and sliding scale insulin.  Monitor sugars  On January 09, 2022 I increased Lantus from 30 units to 32 units daily, monitor.  Continue current regimen  On January 11, 2022  I have increased the prandial insulin from 3 units 3 times daily to 4 units 3 times daily with meals  Some low sugars noted this morning.  Will decrease Lantus back to 30 units subcu daily.    Continue to monitor sugars  Continue Lantus 32 units subcu daily      -Hypertension-blood pressure is trending up.  Add as needed hydralazine  Blood pressure is trending high.  On January 13, 2022 I have added amlodipine 2.5 mg p.o. daily.    Monitor blood pressure  On January 16, 2022, will increase amlodipine to 5 mg p.o. daily with hold parameters       -Hypomagnesemia-repleted    -Hypokalemia  On January 07, 2022 potassium was 3.0.  Repleted  On January 08, 2022 potassium is 3.7  On January 09, 2022 potassium was 3.6  On January 10, 2022 potassium is 3.6  On January 14, 2022 potassium is 3.5  On January 16, 2022 potassium was 3.4.  Will replete     -Anemia-continue ferrous sulfate, monitor  On January 07, 2022 hemoglobin is 8.3  On January 14, 2022 hemoglobin is 8.4    -Hypercalcemia, primary hyperparathyroidism  On January 07, 2022 calcium was 10.6  On January 08, 2022 calcium is 15  On January 09, 2022 calcium is 10.9  On January 10, 2022 calcium is 10.5  On January 14, 2022 calcium is 10.2  Patient follows with endocrinologist as an outpatient     Fall precautions, aspiration precautions  I discussed with patient.  I discussed with RN    Functional Progress:    PHYSICAL THERAPY    ON ADMISSION MOST RECENT   Balance  Balance  Posture: Poor  Sitting - Static: Good  Sitting - Dynamic: Fair  Standing - Static: Fair, -  Standing - Dynamic: Poor  Poor  Good  Fair  Fair;-  Poor      Balance  Balance  Posture: Poor  Sitting - Static: Good  Sitting - Dynamic: Fair  Standing - Static: Fair, -  Standing - Dynamic: Poor  Posture: Poor  Sitting - Static: Good  Sitting - Dynamic: Fair  Standing - Static: Fair;-  Standing - Dynamic: Poor        Bed Mobility  Maximum assistance  Maximum assistance  Moderate assistance (Pt requird increased encouragement and time to transition to seated position.)  Moderate Assistance  Maximum assistance  Moderate assistance    Bed Mobility  Rolling to Right: Minimal assistance, Moderate assistance  Rolling to Left: Minimal assistance, Moderate assistance  Supine to Sit: Stand by assistance (Pt used log roll (to  the right) to get to EOB requiring increased time to complete.)  Sit to Stand: Contact guard assistance, Minimal Assistance  Sit to Supine: Moderate assistance  Bed to Chair: Contact guard assistance, Minimal assistance      Wheelchair Mobility  Yes       Wheelchair Mobility  Propulsion: Yes         Ambulation  Rolling Walker  Moderate assistance  Level tile  12 Ambulation  Device: Rolling Walker  Assistance: Contact guard assistance  Surface: Level tile  Distance: 50 Feet   Stairs  No  Left ascending (side stepping with B UE support on hand rail)  Minimal assistance Stairs  Stairs?: Yes  Rails: Left ascending (side stepping with B UE support on hand rail)  Assistance: Minimal assistance     Functional Progress:    OCCUPATIONAL THERAPY    ON ADMISSION  MOST RECENT   Eating  Supervision Eating  Feeding: Supervision   Grooming  Supervision, Setup (sitting EOB) Grooming  Grooming: Supervision, Setup   Bathing  UB Bathing Supervision, Setup (sitting EOB)  LB Bathing Maximum assistance Bathing  UB Bathing   UE Bathing: Supervision, Setup  LB Bathing   WGYK([id number])@   Upper Body Dressing  Maximum assistance   Upper Body Dressing  UE Dressing: Supervision, Setup, Stand by assistance   Lower Body Dressing  Maximum assistance Lower Body Dressing  LE Dressing: Minimal assistance   Toileting  Maximum assistance Toileting  Toileting: Moderate assistance   Toilet Transfers  Moderate assistance Toilet Transfers  Toilet Transfer: Moderate assistance   Tub Transfers  Tub Transfers: Not tested  [place] Transfers: Not tested Tub Transfers  Tub Transfers  Tub Transfers: Not tested  SHOWER Transfers  Tub Transfers  Tub Transfers: Not tested     Legend:   7 - Independent   6 - Modified Independent   5 - Standby Assistance / Supervision / Set-up   4 - Minimum Assistance / Contact Guard Assistance   3 - Moderate Assistance   2 - Maximum Assistance   1 - Total Assistance / Dependent             Plan:     1.  Justification for continued stay: Fair progression towards established rehabilitation goals.  Patient is now able to ambulate 50 feet with a rolling walker and contact-guard assist in comparison to being able to ambulate 12 feet with a rolling walker and moderate assistance at the time of admission.    2. Medical Issues being followed closely:    [x]   Fall and safety precautions     [x]   Wound Care     [x]   Bowel and Bladder Function     [x]   Fluid Electrolyte and Nutrition Balance     [x]   Pain Control      3. Issues that 24 hour rehabilitation nursing is following:    [x]   Fall and safety precautions     [x]   Wound Care     [x]   Bowel and Bladder Function     [x]   Fluid Electrolyte and Nutrition Balance     [x]   Pain Control      [x]   Assistance with and education on in-room safety with transfers to and from the bed, wheelchair, toilet and shower.      4. Acute rehabilitation plan of care:    [x]   Continue current care and rehab.           [x]   Physical Therapy           [x]   Occupational Therapy           [x]   Speech Therapy     []   Hold Rehab until further notice     5. Medications:    [x]   MAR Reviewed     [x]   Continue Present Medications       6. Chemical DVT Prophylaxis:      [x]   Enoxaparin     []   Unfractionated Heparin     []   Warfarin     []   NOAC     []   Aspirin     []   None     7. Mechanical DVT Prophylaxis:      [x]   TED Stockings     []   Sequential Compression Device     []   None  8. GI Prophylaxis:      []   PPI     []   H2 Blocker     [x]   None / Not indicated     9. Code status:Full    Dragon medical dictation software was used for portions of this report. Unintended errors may occur.      Signed:    , MD      January 16, 2022

## 2022-01-16 NOTE — Plan of Care (Signed)
Problem: Physical Therapy - Adult  Goal: By Discharge: Performs mobility at highest level of function for planned discharge setting.  See evaluation for individualized goals.  Description: Physical Therapy Short Term Goals  Initiated 01/07/2022, reassessed 01/14/2022, and to be accomplished within 7 day(s) (01/14/2022)  1.  Patient will supine to sit, sit to supine, roll right, and roll left  in bed with moderate assistance .  MET 01/14/2022   Updated Goal: Patient will supine to sit, sit to supine, roll right, and roll left  in bed with minimal assistance.  2.  Patient will transfer from bed to chair and chair to bed with minimal assistance/contact guard assist using the least restrictive device.  MET 01/14/2022   Updated Goal:  Patient will transfer from bed to chair and chair to bed with supervision using the least restrictive device.  3.  Patient will perform sit to stand with minimal assistance/contact guard assist.  MET 01/14/2022   Updated Goal:  Patient will perform sit to stand with supervision.  4.  Patient will ambulate with minimal assistance/contact guard assist for 50 feet with the least restrictive device.  MET 01/14/2022   Updated Goal:  Patient will ambulate with supervision for 150 feet with the least restrictive device.   5.  Patient will ascend/descend 3 stairs with B handrail(s) with minimal assistance/contact guard assist.  Not Met - Ongoing    Physical Therapy Long Term Goals  Initiated 01/07/2022 and to be accomplished within 14-21 day(s) (01/28/2022)  1.  Patient will supine to sit, sit to supine, roll right, and roll left in bed with supervision/set-up.    2.  Patient will transfer from bed to chair and chair to bed with supervision/set-up using the least restrictive device.  3.  Patient will perform sit to stand with supervision/set-up.  4.  Patient will ambulate with supervision/set-up for 150 feet with the least restrictive device.   5.  Patient will ascend/descend 4 stairs with 1 handrail(s)  with supervision/set-up      Outcome: Progressing      PHYSICAL THERAPY TREATMENT    Patient: [name] Short (66 y.o. female)  Date: 01/16/2022  Diagnosis: Acute CVA (cerebrovascular accident) [facility]) [I63.9]   Precautions: Fall Risk Precautions  Chart, physical therapy assessment, plan of care and goals were reviewed.    Time in: 0932  Time out : 1107    Patient seen for: Patient Education, Training and development officer, Personnel officer, Wheelchair Mobility Training    Pain:  Pt pain was reported as 8/10 pre-treatment.  Pt pain was reported as 8/10 post-treatment.  Intervention: Pt was offered opportunity throughout treatment for repositioning and reports she received pain medication prior to treatment.    Patient identified with name and DOB: Yes    SUBJECTIVE:      Pt is pleasant but appears frustrated seated EOB at start of treatment session in hospital gown finishing breakfast.  Pt reports she was not woken up reporting frustration with her daughter who came in this morning and staff although she does reflect she requested an extra hour of sleep after she was initially woken up and repositioned this morning.  PT strongly encouraged pt re: importance of taking some responsibility in preparing for the day whether she request a PM wash up from staff in order to sleep in later or set an alarm for herself to ensure she had enough time in the morning to be set up to wash prior to PT interventions.  Educated pt that she had opportunity  to request assistance in washing up with OT later in the morning for skilled intervention and pt was receptive to participating in mobility training prior to wash up with OT.    OBJECTIVE DATA SUMMARY:    Objective:   Education: Education Given To: Patient  Education Provided: Precautions;Energy Conservation;Safety;Fall Prevention Strategies;Role of Therapy;Plan of Media planner  Education Method: Demonstration;Verbal  Barriers to Learning: Cognition  Education Outcome:  Verbalized understanding;Demonstrated understanding;Continued education needed    TRANSFERS Daily Assessment    Sit to Stand Contact guard assistance;Minimal Assistance    Transfer Assist Score Contact guard assistance;Minimal assistance    Comments Pt requires CGA to minimal steadying assisting initially due to increased sacral pain with patient requiring increased assistance to initiate transfer from bed to bedside commode and maximal cuing for safe hand placement with maximal assistance for managing IV line and wound vac line.    Pt assisted to and from commode at 1000 AM and assisted to commode with hand off to pt's nurse at 1105 AM.    Car Transfer NT    Car Type N/A        GAIT Daily Assessment    Gait Deviations  Exceptions to WDL    Assistive device Rolling Walker    Ambulation assistance - surface  Contact guard assistance  Level tile    Distance 50 Feet    Comments Pt requires CGA for safety with moderate tactile cues for scap add and depression and max verbal cues for forward gaze.  Pt ambulates with decreased B foot clearance with decreased hip and knee flexion in swing phase of gait.    Ambulation-uneven surface  NT        BALANCE Daily Assessment    Posture Poor    Sitting - Static Good    Sitting - Dynamic Fair    Standing - Static Fair;-   Pt stood for approximately 2 minutes with single UE support on RW during toileting task with close supervision.    Standing - Dynamic Poor      WHEELCHAIR MOBILITY/MANAGEMENT Daily Assessment   Able to Propel 150 Feet   Assist Level Supervision   Curbs/ramps assistance required  NT   Wheelchair management manages B brakes with brake extender   Comments Pt requires supervision for safety with maximal cues for efficiency with B UE propulsion.     ASSESSMENT:  Pt is slowly progressing with functional mobility requiring decreased physical assistance from evaluation for transfers and gait.  However, pt continues to appear limited by pain, decreased initiation, and  frequent toileting needs.  Progression toward goals:  _0       Improving appropriately and progressing toward goals  _1       Improving slowly and progressing toward goals  _2       Not making progress toward goals and plan of care will be adjusted      PLAN:  Patient continues to benefit from skilled intervention to address the above impairments.  Continue treatment per established plan of care.  Discharge Recommendations:  Home with Home health PT;24 hour supervision or assist;Subacute/Skilled Nursing Facility  Further Equipment Recommendations for Discharge:        Rolling Walker             Estimated Discharge Date: 01/24/2022    Activity Tolerance:   Fair  Please refer to the flowsheet for vital signs taken during this treatment.    After treatment:   _3  Patient left in no apparent distress  in bed  _0  Patient left in no apparent distress sitting on commode  _1  Patient left in no apparent distress in w/c mobilizing under own power  _2  Patient left in no apparent distress dining area  _3  Patient left in no apparent distress mobilizing under own power  _4  Call bell left within reach  _5  Nursing notified and present  _6  Caregiver present  _7  Bed alarm activated   _8  Chair alarm activated      Rivka Barbara, PT, DPT  01/16/2022

## 2022-01-17 LAB — POCT GLUCOSE
POC Glucose: 158 mg/dL — ABNORMAL HIGH (ref 70–110)
POC Glucose: 113 mg/dL — ABNORMAL HIGH (ref 70–110)
POC Glucose: 248 mg/dL — ABNORMAL HIGH (ref 70–110)
POC Glucose: 98 mg/dL (ref 70–110)

## 2022-01-17 MED FILL — ASPIRIN LOW DOSE 81 MG PO CHEW: 81 MG | ORAL | Qty: 1

## 2022-01-17 MED FILL — PIPERACILLIN SOD-TAZOBACTAM SO 3.375 (3-0.375) G IV SOLR: 3.375 (3-0.375) g | INTRAVENOUS | Qty: 3375

## 2022-01-17 MED FILL — HUMALOG 100 UNIT/ML IJ SOLN: 100 UNIT/ML | INTRAMUSCULAR | Qty: 4

## 2022-01-17 MED FILL — FERROUS SULFATE 325 (65 FE) MG PO TBEC: 325 (65 Fe) MG | ORAL | Qty: 1

## 2022-01-17 MED FILL — HYDROCODONE-ACETAMINOPHEN 5-325 MG PO TABS: 5-325 MG | ORAL | Qty: 1

## 2022-01-17 MED FILL — VANCOMYCIN HCL 750 MG IV SOLR: 750 MG | INTRAVENOUS | Qty: 750

## 2022-01-17 MED FILL — NORVASC 5 MG PO TABS: 5 MG | ORAL | Qty: 1

## 2022-01-17 MED FILL — ATORVASTATIN CALCIUM 40 MG PO TABS: 40 MG | ORAL | Qty: 2

## 2022-01-17 MED FILL — THERAPEUTIC-M PO TABS: ORAL | Qty: 1

## 2022-01-17 MED FILL — LANTUS 100 UNIT/ML SC SOLN: 100 UNIT/ML | SUBCUTANEOUS | Qty: 32

## 2022-01-17 MED FILL — LOVENOX 40 MG/0.4ML IJ SOSY: 40 MG/0.4ML | INTRAMUSCULAR | Qty: 0.4

## 2022-01-17 MED FILL — BIO-K PLUS STRONG PO CPDR: ORAL | Qty: 1

## 2022-01-17 MED FILL — FUROSEMIDE 40 MG PO TABS: 40 MG | ORAL | Qty: 1

## 2022-01-17 MED FILL — HUMALOG 100 UNIT/ML IJ SOLN: 100 UNIT/ML | INTRAMUSCULAR | Qty: 2

## 2022-01-17 NOTE — Plan of Care (Signed)
Problem: Safety - Adult  Goal: Free from fall injury  Outcome: Progressing     Problem: Skin/Tissue Integrity  Goal: Absence of new skin breakdown  Description: 1.  Monitor for areas of redness and/or skin breakdown  2.  Assess vascular access sites hourly  3.  Every 4-6 hours minimum:  Change oxygen saturation probe site  4.  Every 4-6 hours:  If on nasal continuous positive airway pressure, respiratory therapy assess nares and determine need for appliance change or resting period.  Outcome: Progressing     Problem: ABCDS Injury Assessment  Goal: Absence of physical injury  Outcome: Progressing     Problem: Pain  Goal: Verbalizes/displays adequate comfort level or baseline comfort level  Outcome: Progressing

## 2022-01-17 NOTE — Plan of Care (Signed)
Problem: Occupational Therapy - Adult  Goal: By Discharge: Performs self-care activities at highest level of function for planned discharge setting.  See evaluation for individualized goals.  Description: Occupational Therapy Goals   Long Term Goals  Initiated 01/07/2022 and to be accomplished within 2-3 week(s) 01/28/2022  1. Pt will perform self-feeding with [facility].  2. Pt will perform grooming with S/Setup  3. Pt will perform UB bathing with S/Setup  4. Pt will perform LB bathing with Mina.  5. Pt will perform tub/shower transfer with [facility].  6. Pt will perform UB dressing with S/Setup  GM 01/15/22  7. Pt will perform LB dressing with Mina.  Continue for consistency  8. Pt will perform toileting task with [facility]. Continue for consistency  9. Pt will perform toilet transfer with [facility]. GM 01/15/22  10.  Pt will perform an IADL task with good safety and Mina.      Short Term Goals   Initiated 01/07/2022 and to be accomplished within 7 day(s) 01/14/2022; update 01/15/22  1. Pt will perform self-feeding with Setup.  GM 01/15/22  2. Pt will perform grooming with Setup.  GM 01/15/22  3. Pt will perform UB bathing with Setup. GM 01/15/22  4. Pt will perform LB bathing with [facility].  GM 01/15/22  5. Pt will perform tub/shower transfer with  [facility]. To be addressed at a later date  6. Pt will perform UB dressing with Mina.  GM 01/15/22  7. Pt will perform LB dressing with [facility]. GM 01/15/22  8. Pt will perform toileting task with [facility]. GM 01/15/22  9. Pt will perform toilet transfer with [facility].GM 01/15/22         Outcome: Progressing  Occupational Therapy TREATMENT    Patient: [name] Short   66 y.o.    Patient identified with name and DOB: Yes     Date: 01/17/2022    First Tx Session  Time In: 0735  Time Out: 0858    Second Tx Session  Time In: 1124  Time Out: 1145    Third Tx Session  Time In: 1145  Time Out: 1200    Diagnosis: Acute CVA (cerebrovascular accident) [facility]) [I63.9]   Precautions: Restrictions/Precautions: Fall Risk, Surgical Protocols  (Cervical Spinal Precautions)  Spinal Precautions: No Bending, No Lifting, No Twisting    Chart, occupational therapy assessment, plan of care, and goals were reviewed.     Pain:  Pt reports 6/10 pain or discomfort prior to treatment.    Pt reports 6/10 pain or discomfort post treatment.   Intervention Provided: N/A      SUBJECTIVE:   Patient stated "I lost count."  Pt encouraged to focus on therapy and minimize distractions in room to maximize therapy sessions.  Pt daughter present and was asked to leave by pt.     OBJECTIVE DATA SUMMARY:   Sitting Balance:    Standing Balance:      THERAPEUTIC ACTIVITY Daily Assessment    Clinical Team and pt/CG daughter participated in planning for discharge. Pt agreeable to SNF placement per insurance approval.      THERAPEUTIC EXERCISE Daily Assessment    Bicep curls and chest press/pushes 3x10 with 2lb weight in both hands seated in w/c.      UPPER BODY BATHING Daily Assessment   Functional Level Supervision;Stand by assistance;Setup   Clinical Factors Pt encouragement to reach to neck to undo gown.  OT adapted LHS to increase ease for washing back.      LOWER BODY BATHING  Daily Assessment   Functional Level  Minimal assistance (Substantial amountof time to complete task)   Clinical Factors       UPPER BODY DRESSING/UNDRESSING Daily Assessment   Functional Level Supervision;Setup   Clinical Factors       LOWER BODY DRESSING/UNDRESSING Daily Assessment   Functional Level Minimal assistance   Clinical Factors asst to donn brief and compression stockings.  Pt requires a substantial amount of time to complete task.     TOILETING    Functional Level Minimal assistance   Clinical factors asst with CM/brief management 2/2 to wound vac cord.  Pt required increase amount of time and  verbal/physical asst to reach behind for buttock hygiene.  Pt noted to perseverate on front perihygiene.     FUNCTIONAL MOBILITY Daily Assessment    Supine to EOB with I     ASSESSMENT:  Pt increasing  strength, balance, strategies for safe independent ADLs.   Progression toward goals:  []           Improving appropriately and progressing toward goals  [x]           Improving slowly and progressing toward goals  []           Not making progress toward goals and plan of care will be adjusted     PLAN:  Patient continues to benefit from skilled intervention to address the above impairments.  Continue treatment per established plan of care.  Discharge Recommendations:  Skilled Nursing Facility  Further Equipment Recommendations for Discharge:   N/A     Activity Tolerance:    Fair     EDUCATION:    Bathing strategies    COMMUNICATION:   []  Home safety education was provided and the patient/caregiver indicated understanding.  [x]  Patient/family have participated as able in goal setting and plan of care.  []  Patient/family agree to work toward stated goals and plan of care.  []  Patient understands intent and goals of therapy, but is neutral about his/her participation.  []  Patient is unable to participate in goal setting and plan of care.    Please refer to the flowsheet for vital signs taken during this treatment.  After treatment:   []   Patient left in no apparent distress sitting up in chair   [x]   Patient left in no apparent distress in bed  []   Call bell left within reach  []   Nursing notified  []   Caregiver present  []   Bed alarm activated    Estimated LOS: 2-3 weeks  IRF-PAI Completed: N/A  Entered Differentiated Treatment minutes: N/A    , OT

## 2022-01-17 NOTE — Plan of Care (Signed)
Problem: Physical Therapy - Adult  Goal: By Discharge: Performs mobility at highest level of function for planned discharge setting.  See evaluation for individualized goals.  Description: Physical Therapy Short Term Goals  Initiated 01/07/2022, reassessed 01/14/2022, and to be accomplished within 7 day(s) (01/21/2022)  1.  Patient will supine to sit, sit to supine, roll right, and roll left  in bed with moderate assistance .  MET 01/14/2022   Updated Goal: Patient will supine to sit, sit to supine, roll right, and roll left  in bed with minimal assistance.  2.  Patient will transfer from bed to chair and chair to bed with minimal assistance/contact guard assist using the least restrictive device.  MET 01/14/2022   Updated Goal:  Patient will transfer from bed to chair and chair to bed with supervision using the least restrictive device.  3.  Patient will perform sit to stand with minimal assistance/contact guard assist.  MET 01/14/2022   Updated Goal:  Patient will perform sit to stand with supervision.  4.  Patient will ambulate with minimal assistance/contact guard assist for 50 feet with the least restrictive device.  MET 01/14/2022   Updated Goal:  Patient will ambulate with supervision for 150 feet with the least restrictive device.   5.  Patient will ascend/descend 3 stairs with B handrail(s) with minimal assistance/contact guard assist.  Not Met - Ongoing    Physical Therapy Long Term Goals  Initiated 01/07/2022 and to be accomplished within 14-21 day(s) (01/28/2022)  1.  Patient will supine to sit, sit to supine, roll right, and roll left in bed with supervision/set-up.    2.  Patient will transfer from bed to chair and chair to bed with supervision/set-up using the least restrictive device.  3.  Patient will perform sit to stand with supervision/set-up.  4.  Patient will ambulate with supervision/set-up for 150 feet with the least restrictive device.   5.  Patient will ascend/descend 4 stairs with 1 handrail(s)  with supervision/set-up      Outcome: Progressing      PHYSICAL THERAPY TREATMENT    Patient: [name] Short (66 y.o. female)  Date: 01/17/2022  Diagnosis: Acute CVA (cerebrovascular accident) [facility]) [I63.9]   Precautions: Fall Risk Precautions, Cervical Spinal Precautions  Chart, physical therapy assessment, plan of care and goals were reviewed.    Time in: 0940  Time out : 1120    Patient seen for: Gait Training, Public librarian, Patient Education  At 1120 PT was joined by SLP, OT, Unit Social Worker, and Unit Nurse Practitioner to discuss pt's progress with pt and her daughter to promote safe and appropriate d/c planning.    Pain:  Pt pain was reported as ongoing sacral wound pain pre-treatment.  Pt pain was reported as sacral wound pain post-treatment.  Intervention: Pt's nursing staff notified and aware    Patient identified with name and DOB: Yes    SUBJECTIVE:      Pt reports she understands PT recommendation for ongoing skilled PT intervention and feels she would benefit from ongoing inpatient therapy.  Pt requests assistance with toileting at 0940 and at 1100 and was assisted to commode each time.  Nursing staff aware.    OBJECTIVE DATA SUMMARY:    Objective:   Education: Education Provided: Precautions;Energy Conservation;Safety;Fall Prevention Strategies;Role of Therapy;Plan of Care;Transfer Training;Mobility Training  Education Method: Demonstration;Verbal  Education Outcome: Verbalized understanding;Demonstrated understanding;Continued education needed    TRANSFERS Daily Assessment    Sit to Stand Contact guard assistance  Transfer Assist Score Contact guard assistance    Comments Pt requires SBA to CGA for safety as pt demonstrates compensatory increased B UE weight bearing with transition sit to stand and stand to sit.  Pt performs with wide BOS and requires maximal verbal cues for quality with moderate manual cues for anterior weight shift with sit to stand and increased hip flexion  with stand to sit.  Pt assisted bed to commode to bed and bed to w/c to her left at start of session and w/c <> commode at end of treatment session.        GAIT Daily Assessment    Gait Deviations Decreased step height;Decreased step length    Assistive device Rolling Walker    Ambulation assistance - surface  Stand by assistance  Level tile    Distance 76 Feet    Comments Pt requires SBA for safety with assistance for line (wound vac and IV) management ambulating 76 feet with RW with moderate verbal cuing for forward gaze and upright posture to maintain precautions as well as moderate verbal cues for hip and knee flexion with swing phase of gait.      BALANCE Daily Assessment    Posture Poor    Sitting - Static Good    Sitting - Dynamic Fair    Standing - Static Fair;-    Standing - Dynamic Poor      WHEELCHAIR MOBILITY/MANAGEMENT Daily Assessment   Able to Propel 200 Feet    Assist Level  Supervision   Curbs/ramps assistance required  NT   Wheelchair management  Manages B Brakes, left brake with brake extender.   Comments Pt requires supervision with moderate verbal cues for efficient propulsion due to path deviations to left with decreased left UE coordination.       ASSESSMENT:  Pt is progressing with functional mobility requiring decreased physical assistance with ambulation but demonstrates ongoing functional strength and balance impairments limiting safety and independence with mobility.   Progression toward goals:  [x]       Improving appropriately and progressing toward goals  []       Improving slowly and progressing toward goals  []       Not making progress toward goals and plan of care will be adjusted      PLAN:  Patient continues to benefit from skilled intervention to address the above impairments.  Continue treatment per established plan of care.  Emphasize functional strengthening and postural re-education to promote increased safety and independence with mobility.  Discharge Recommendations:  Home  with Home health PT;24 hour supervision or assist;Subacute/Skilled Nursing Facility  Further Equipment Recommendations for Discharge:        Rolling Walker             Estimated Discharge Date: 01/24/2022    Activity Tolerance:   Good  Please refer to the flowsheet for vital signs taken during this treatment.    After treatment:   []  Patient left in no apparent distress in bed  [x]  Patient left in no apparent distress sitting up in chair  []  Patient left in no apparent distress in w/c mobilizing under own power  []  Patient left in no apparent distress dining area  []  Patient left in no apparent distress mobilizing under own power  [x]  Call bell left within reach  [x]  Nursing notified  []  Caregiver present  []  Bed alarm activated   [x]  Chair alarm activated      Rivka Barbara, PT, DPT  01/17/2022

## 2022-01-17 NOTE — Plan of Care (Addendum)
Problem: SLP Adult - Disturbed Thought Process  Goal: By Discharge: Demonstrates cognitive skills at highest level of function for planned discharge setting.   See evaluation for individualized goals.  Description: Long term goals (Initiated 01/07/22 to be accomplished by 01/21/2022)  Patient will:  1. Be oriented x 3 and recall events of the day, supervision.  2. Follow 3 part and complex commands, 90% accuracy.  3.  Listen to 3-4 sentence length paragraphs and respond to questions re: content with 90% accuracy.  4.  List 10-12 items in categories of increasing abstraction, supervision.  5.  Discuss similarities/differences given 2 items, 80-90% accuracy.    Short term goals (by 01/21/22)  Patient will:  1.  Be oriented x 3 and recall events of the day, min assist.  2.  Follow 3 part and complex commands, 70-60% accuracy.  3.  Listen to 3-4 sentence length paragraphs and respond to questions re: content with 70-80% accuracy.  4.  List 10-12 items in categories of increasing abstraction, min assist.  5.  Discuss similarities/differences given 2 items, 70-80% accuracy.      Note:   SPEECH-LANGUAGE TREATMENT    Patient: [name] Short (66 y.o. female)  Date: 01/17/2022  Diagnosis: Acute CVA (cerebrovascular accident) [facility]) [I63.9] Acute CVA (cerebrovascular accident) ([facility]CC)      Precautions: Standard  PLOF: As per H&P     ASSESSMENT:  Patient was sleeping when entered and remained tired for the session but participated in all tasks.   Progression toward goals:  [x]        Improving appropriately and progressing toward goals  []        Improving slowly and progressing toward goals  []        Not making progress toward goals and plan of care will be adjusted     PLAN:  Patient continues to benefit from skilled intervention to address the above impairments.  Continue treatment per established plan of care.     SUBJECTIVE:       Patient stated "I fell asleep trying to read this paperwork".    OBJECTIVE:   Mental Status:  Patient  was present and aware during today's session.   Treatment & Interventions:  Neuro-Linguistics:   Orientation:  Independent  Recent recall:  Independent  Categories:  Things that are cold- listed 12 with min assist     Things that grow- listed 16 independently  Answering questions  From paragraphs: 68% accuracy given repeating as needed          Response & Tolerance to Activities:   Patient was tired and did not perform at typical accuracy.     PAIN:  Start of Tx: no report  End of Tx: no report     After treatment:   [x]        Patient left in no apparent distress sitting up in chair  []        Patient left in no apparent distress in bed  [x]        Call bell left within reach  []        Nursing notified  []        Caregiver present  []        Bed alarm activated      COMMUNICATION/EDUCATION:   []  Patient educated regarding compensatory speech/language/comprehension techniques provided via demonstration, verbalization and teach back of comprehension  [x]  Patient/family have participated as able in goal setting and plan of care.  [x]  Patient/family agree to work toward stated goals and  plan of care.  []  Patient understands intent and goals of therapy, neutral about participation.  []  Patient unable to participate in goal setting/plan of care secondary to cognition, hearing/vision deficits; education ongoing with interdisciplinary staff       HUNTER BOWEN  , CCC-SLP      Addendum:  SLP also participated in a family conference this morning with the OT, PT, SW, NP and her daughter.  Patient will likely not need continue SLP follow-up post discharge from the ARU.  She will benefit from continued skilled services for mobility training and to gain independence with ADLs.    , CCC-SLP

## 2022-01-17 NOTE — Progress Notes (Signed)
[facility]  [address], [place], [place] [address]     INPATIENT REHABILITATION  DAILY PROGRESS NOTE     Date: 01/17/2022    Name: [name] Short Age / Sex: 66 y.o. 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  Patient is sitting in a chair in no apparent distress.  Patient is in good spirits    Objective:     Vital Signs:  Patient Vitals for the past 24 hrs:   BP Temp Temp src Pulse Resp SpO2   01/17/22 0715 135/60 97.4 F (36.3 C) Oral 62 16 99 %   01/16/22 2217 -- -- -- -- 18 --   01/16/22 2000 (!) 153/70 98 F (36.7 C) Oral 73 16 99 %   01/16/22 1630 (!) 160/72 97.7 F (36.5 C) Oral 69 16 100 %        Physical Examination:  General:  Awake, alert  Cardiovascular:  S1S2+, RRR  Pulmonary:  CTA b/l  GI:  Soft, BS+, NT, ND  Extremities:  +Edema  Has sacral decubitus ulcer.  Has wound VAC in place    Current Medications:  Current Facility-Administered Medications   Medication Dose Route Frequency    amLODIPine (NORVASC) tablet 2.5 mg  2.5 mg Oral Daily    lactobacillus delayed release capsule 1 capsule  1 capsule Oral Daily with breakfast    insulin lispro (HUMALOG) injection vial 4 Units  4 Units SubCUTAneous TID WC    hydrALAZINE (APRESOLINE) tablet 10 mg  10 mg Oral Q6H PRN    vancomycin (VANCOCIN) 750 mg in sodium chloride 0.9 % 250 mL IVPB (vial-mate)  750 mg IntraVENous Q12H    ferrous sulfate (FE TABS 325) EC tablet 325 mg  325 mg Oral Daily with breakfast    insulin glargine (LANTUS) injection vial 32 Units  32 Units SubCUTAneous Daily    ondansetron (ZOFRAN-ODT) disintegrating tablet 4 mg  4 mg SubLINGual Q8H PRN    aspirin chewable tablet 81 mg  81 mg Oral Daily    atorvastatin (LIPITOR) tablet 80 mg  80 mg Oral  Nightly    enoxaparin (LOVENOX) injection 40 mg  40 mg SubCUTAneous Daily    furosemide (LASIX) tablet 40 mg  40 mg Oral Daily    HYDROcodone-acetaminophen (NORCO) 5-325 MG per tablet 1 tablet  1 tablet Oral Q4H PRN    therapeutic multivitamin-minerals 1 tablet  1 tablet Oral Daily    glucose chewable tablet 16 g  4 tablet Oral PRN    dextrose bolus 10% 125 mL  125 mL IntraVENous PRN    Or    dextrose bolus 10% 250 mL  250 mL IntraVENous PRN    glucagon (rDNA) injection 1 mg  1 mg SubCUTAneous PRN    dextrose 10 % infusion   IntraVENous Continuous PRN    acetaminophen (TYLENOL) tablet 650 mg  650 mg Oral Q4H PRN    polyethylene glycol (GLYCOLAX) packet 17 g  17 g Oral Daily PRN    insulin lispro (HUMALOG) injection vial 0-8 Units  0-8 Units SubCUTAneous TID WC    insulin lispro (HUMALOG) injection vial 0-4 Units  0-4 Units SubCUTAneous Nightly  piperacillin-tazobactam (ZOSYN) 3,375 mg in sodium chloride 0.9 % 50 mL IVPB (mini-bag)  3,375 mg IntraVENous Q8H       Allergies:  Allergies   Allergen Reactions    Lactose Other (See Comments)     Intolerance-Pt denies       Lab/Data Review:  Recent Results (from the past 24 hour(s))   POCT Glucose    Collection Time: 01/16/22  4:40 PM   Result Value Ref Range    POC Glucose 166 (H) 70 - 110 mg/dL   POCT Glucose    Collection Time: 01/16/22  9:36 PM   Result Value Ref Range    POC Glucose 158 (H) 70 - 110 mg/dL   POCT Glucose    Collection Time: 01/17/22  7:21 AM   Result Value Ref Range    POC Glucose 113 (H) 70 - 110 mg/dL   POCT Glucose    Collection Time: 01/17/22 11:48 AM   Result Value Ref Range    POC Glucose 248 (H) 70 - 110 mg/dL   POCT Glucose    Collection Time: 01/17/22  4:11 PM   Result Value Ref Range    POC Glucose 98 70 - 110 mg/dL         Assessment:     Primary Rehabilitation Diagnosis impaired mobility and ADLs in the setting of 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     Other Co-Morbid Conditions managed in Rehab   Cervical spondylosis with  myelopathy, status post laminectomy and fusion of C3-C6 and external bone growth stimulator placement on Dec 09, 2021  Infected sacral decubitus ulcer-status post debridement  Type 2 diabetes with hyperglycemia  Hypertension  Hypomagnesemia  Anemia  Hypokalemia    PLAN  -impaired mobility and ADLs in the setting of 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   Aspirin and statin  Physical therapy, Occupational Therapy, speech therapy     -Cervical spondylosis with myelopathy, status post laminectomy and fusion of C3-C6 and external bone growth stimulator placement on Dec 09, 2021-PT and OT.  Judicious pain control.  Continue bowel regimen        -Infected sacral decubitus ulcer-status post debridement.  Continue wound care and IV antibiotics.  Routine PICC line care.  Continue wound care and wound VAC.  On probiotics     -Type 2 diabetes with hyperglycemia.  Continue Lantus insulin and prandial insulin and sliding scale insulin.  Monitor sugars  On January 09, 2022 I increased Lantus from 30 units to 32 units daily, monitor.  Continue current regimen  On January 11, 2022 I have increased the prandial insulin from 3 units 3 times daily to 4 units 3 times daily with meals  Some low sugars noted this morning.  Will decrease Lantus back to 30 units subcu daily.    Continue to monitor sugars  Continue Lantus 32 units subcu daily     -Hypertension-blood pressure is trending up.  Add as needed hydralazine  Blood pressure is trending high.  On January 13, 2022 I have added amlodipine 2.5 mg p.o. daily.    Monitor blood pressure  On January 16, 2022, will increase amlodipine to 5 mg p.o. daily with hold parameters  Continue amlodipine 5 mg p.o. daily     -Hypomagnesemia-repleted    -Hypokalemia  On January 07, 2022 potassium was 3.0.  Repleted  On January 08, 2022 potassium is 3.7  On January 09, 2022 potassium was 3.6  On January 10, 2022 potassium  is 3.6  On January 14, 2022 potassium is 3.5  On January 16, 2022 potassium was 3.4.  Will replete      -Anemia-continue ferrous sulfate, monitor  On January 07, 2022 hemoglobin is 8.3  On January 14, 2022 hemoglobin is 8.4    -Hypercalcemia, primary hyperparathyroidism  On January 07, 2022 calcium was 10.6  On January 08, 2022 calcium is 45  On January 09, 2022 calcium is 10.9  On January 10, 2022 calcium is 10.5  On January 14, 2022 calcium is 10.2  Patient follows with endocrinologist as an outpatient     Fall precautions, aspiration precautions  I discussed with patient.  I discussed with Child psychotherapist    Functional Progress:    PHYSICAL THERAPY    ON ADMISSION MOST RECENT   Balance  Balance  Posture: Poor  Sitting - Static: Good  Sitting - Dynamic: Fair  Standing - Static: Fair, -  Standing - Dynamic: Poor  Poor  Good  Fair  Fair;-  Poor      Balance  Balance  Posture: Poor  Sitting - Static: Good  Sitting - Dynamic: Fair  Standing - Static: Fair, -  Standing - Dynamic: Poor  Posture: Poor  Sitting - Static: Good  Sitting - Dynamic: Fair  Standing - Static: Fair;-  Standing - Dynamic: Poor        Bed Mobility  Maximum assistance  Maximum assistance  Moderate assistance (Pt requird increased encouragement and time to transition to seated position.)  Moderate Assistance  Maximum assistance  Moderate assistance    Bed Mobility  Rolling to Right: Minimal assistance, Moderate assistance  Rolling to Left: Minimal assistance, Moderate assistance  Supine to Sit: Stand by assistance (Pt used log roll (to the right) to get to EOB requiring increased time to complete.)  Sit to Stand: Contact guard assistance  Sit to Supine: Moderate assistance  Bed to Chair: Contact guard assistance      Wheelchair Mobility  Yes       Wheelchair Mobility  Propulsion: Yes         Ambulation  Rolling Walker  Moderate assistance  Level tile  12 Ambulation  Device: Rolling Walker  Assistance: Stand by assistance  Surface: Level tile  Distance: 76 Feet   Stairs  No  Left ascending (side stepping with B UE support on hand rail)  Minimal assistance Stairs  Stairs?:  Yes  Rails: Left ascending (side stepping with B UE support on hand rail)  Assistance: Minimal assistance     Functional Progress:    OCCUPATIONAL THERAPY    ON ADMISSION MOST RECENT   Eating  Supervision Eating  Feeding: Supervision   Grooming  Supervision, Setup (sitting EOB) Grooming  Grooming: Supervision, Setup   Bathing  UB Bathing Supervision, Setup (sitting EOB)  LB Bathing Maximum assistance Bathing  UB Bathing   UE Bathing: Supervision, Stand by assistance, Setup  LB Bathing   NTZG([id number])@   Upper Body Dressing  Maximum assistance   Upper Body Dressing  UE Dressing: Supervision, Setup   Lower Body Dressing  Maximum assistance Lower Body Dressing  LE Dressing: Minimal assistance   Toileting  Maximum assistance Toileting  Toileting: Minimal assistance   Toilet Transfers  Moderate assistance Toilet Transfers  Toilet Transfer: Moderate assistance   Tub Transfers  Tub Transfers: Not tested  [place] Transfers: Not tested Tub Transfers  Tub Transfers  Tub Transfers: Not tested  SHOWER Transfers  Tub Transfers  Tub  Transfers: Not tested     Legend:   7 - Independent   6 - Modified Independent   5 - Standby Assistance / Supervision / Set-up   4 - Minimum Assistance / Contact Guard Assistance   3 - Moderate Assistance   2 - Maximum Assistance   1 - Total Assistance / Dependent             Plan:     1. Justification for continued stay: Fair progression towards established rehabilitation goals.  Patient is now requiring minimal assistance with toileting in comparison to requiring maximum assistance with toileting at the time of admission.    2. Medical Issues being followed closely:    [x]   Fall and safety precautions     [x]   Wound Care     [x]   Bowel and Bladder Function     [x]   Fluid Electrolyte and Nutrition Balance     [x]   Pain Control      3. Issues that 24 hour rehabilitation nursing is following:    [x]   Fall and safety precautions     [x]   Wound Care     [x]   Bowel and Bladder  Function     [x]   Fluid Electrolyte and Nutrition Balance     [x]   Pain Control      [x]   Assistance with and education on in-room safety with transfers to and from the bed, wheelchair, toilet and shower.      4. Acute rehabilitation plan of care:    [x]   Continue current care and rehab.           [x]   Physical Therapy           [x]   Occupational Therapy           [x]   Speech Therapy     []   Hold Rehab until further notice     5. Medications:    [x]   MAR Reviewed     [x]   Continue Present Medications       6. Chemical DVT Prophylaxis:      [x]   Enoxaparin     []   Unfractionated Heparin     []   Warfarin     []   NOAC     []   Aspirin     []   None     7. Mechanical DVT Prophylaxis:      [x]   TED Stockings     []   Sequential Compression Device     []   None     8. GI Prophylaxis:      []   PPI     []   H2 Blocker     [x]   None / Not indicated     9. Code status:Full    Dragon medical dictation software was used for portions of this report. Unintended errors may occur.      Signed:    , MD      January 17, 2022

## 2022-01-17 NOTE — Plan of Care (Signed)
Problem: Chronic Conditions and Co-morbidities  Goal: Patient's chronic conditions and co-morbidity symptoms are monitored and maintained or improved  Outcome: Progressing  Flowsheets (Taken 01/16/2022 2000)  Care Plan - Patient's Chronic Conditions and Co-Morbidity Symptoms are Monitored and Maintained or Improved: Monitor and assess patient's chronic conditions and comorbid symptoms for stability, deterioration, or improvement     Problem: Safety - Adult  Goal: Free from fall injury  Outcome: Progressing  Flowsheets (Taken 01/17/2022 0043)  Free From Fall Injury: Instruct family/caregiver on patient safety     Problem: Skin/Tissue Integrity  Goal: Absence of new skin breakdown  Description: 1.  Monitor for areas of redness and/or skin breakdown  2.  Assess vascular access sites hourly  3.  Every 4-6 hours minimum:  Change oxygen saturation probe site  4.  Every 4-6 hours:  If on nasal continuous positive airway pressure, respiratory therapy assess nares and determine need for appliance change or resting period.  Outcome: Progressing     Problem: ABCDS Injury Assessment  Goal: Absence of physical injury  Outcome: Progressing  Flowsheets (Taken 01/17/2022 0043)  Absence of Physical Injury: Implement safety measures based on patient assessment     Problem: Nutrition Deficit:  Goal: Optimize nutritional status  Outcome: Progressing

## 2022-01-17 NOTE — Progress Notes (Signed)
Sw present for family conference with pt, daughter, OT, PT, SLP and NP. Conference was scheduled to begin at 11 but was delayed due to pt's need for toileting.     Team members shared pt's progress and answered questions regarding function. Sw shared summary of conversations noting - pt and daughter's continued agreement to make a primary plan for transfer to SNF and secondary plan for transfer to home with community supports. Pt and daughter agree with plan for accepting SNF to submit to insurance as soon as able, acknowledging that the pt may be able to transfer prior to the currently planned 6/23.    Sw has sent clinicals to [facility], [address] of [place], [address], [address], [place], [place], [address], [place]. Sw spoke with [place] with [facility] [place] [facility] [place] who are reviewing clinical needs. Sw answered questions and will continue to support.     Sw will follow.

## 2022-01-18 LAB — POCT GLUCOSE
POC Glucose: 159 mg/dL — ABNORMAL HIGH (ref 70–110)
POC Glucose: 87 mg/dL (ref 70–110)
POC Glucose: 196 mg/dL — ABNORMAL HIGH (ref 70–110)
POC Glucose: 207 mg/dL — ABNORMAL HIGH (ref 70–110)

## 2022-01-18 LAB — BASIC METABOLIC PANEL
Anion Gap: 4 mmol/L (ref 3.0–18)
BUN/Creatinine Ratio: 31 — ABNORMAL HIGH (ref 12–20)
BUN: 21 mg/dL — ABNORMAL HIGH (ref 7.0–18)
CO2: 27 mmol/L (ref 21–32)
Calcium: 10.6 mg/dL — ABNORMAL HIGH (ref 8.5–10.1)
Chloride: 113 mmol/L — ABNORMAL HIGH (ref 100–111)
Creatinine: 0.68 mg/dL (ref 0.6–1.3)
Est, Glom Filt Rate: >60 mL/min/{1.73_m2} (ref >60)
Glucose: 83 mg/dL (ref 74–99)
Potassium: 3.3 mmol/L — ABNORMAL LOW (ref 3.5–5.5)
Sodium: 144 mmol/L (ref 136–145)

## 2022-01-18 LAB — VANCOMYCIN LEVEL, RANDOM: Vancomycin Rm: 18.4 ug/mL (ref 5.0–40.0)

## 2022-01-18 MED FILL — ASPIRIN LOW DOSE 81 MG PO CHEW: 81 MG | ORAL | Qty: 1

## 2022-01-18 MED FILL — VANCOMYCIN HCL 750 MG IV SOLR: 750 MG | INTRAVENOUS | Qty: 750

## 2022-01-18 MED FILL — FERROUS SULFATE 325 (65 FE) MG PO TBEC: 325 (65 Fe) MG | ORAL | Qty: 1

## 2022-01-18 MED FILL — BIO-K PLUS STRONG PO CPDR: ORAL | Qty: 1

## 2022-01-18 MED FILL — THERAPEUTIC-M PO TABS: ORAL | Qty: 1

## 2022-01-18 MED FILL — PIPERACILLIN SOD-TAZOBACTAM SO 3.375 (3-0.375) G IV SOLR: 3.375 (3-0.375) g | INTRAVENOUS | Qty: 3375

## 2022-01-18 MED FILL — LOVENOX 40 MG/0.4ML IJ SOSY: 40 MG/0.4ML | INTRAMUSCULAR | Qty: 0.4

## 2022-01-18 MED FILL — HYDROCODONE-ACETAMINOPHEN 5-325 MG PO TABS: 5-325 MG | ORAL | Qty: 1

## 2022-01-18 MED FILL — ATORVASTATIN CALCIUM 40 MG PO TABS: 40 MG | ORAL | Qty: 2

## 2022-01-18 MED FILL — NORVASC 5 MG PO TABS: 5 MG | ORAL | Qty: 1

## 2022-01-18 MED FILL — HUMALOG 100 UNIT/ML IJ SOLN: 100 UNIT/ML | INTRAMUSCULAR | Qty: 4

## 2022-01-18 MED FILL — LANTUS 100 UNIT/ML SC SOLN: 100 UNIT/ML | SUBCUTANEOUS | Qty: 32

## 2022-01-18 MED FILL — FUROSEMIDE 40 MG PO TABS: 40 MG | ORAL | Qty: 1

## 2022-01-18 NOTE — Progress Notes (Signed)
[facility]  [address], [place], [place] [address]     INPATIENT REHABILITATION  DAILY PROGRESS NOTE     Date: 01/18/2022    Name: [name] Short Age / Sex: 66 y.o. 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  HPI:  Presented to the hospital (5/25-01/06/22) for failure to thrive. The history was obtained from the patient's daughter. The daughter notes the patient couldn't stand at home and had RUQ pain     The patient was not able to stand up and has had a recent procedure (cervical decompression and fusion 2 weeks before admission) and was evaluated for these symptoms by her spine surgeon Dr. Carney Living, he does not think her issues are related to her recent surgery per his note from today. The daughter notes the patient has been getting progressively weaker over months. Sh went from a cane to a walker in 6 months. The daughter notes the sacral ulcer is actually only a week or 2 old and has progressed quickly. No nausea or vomiting, no diarrhea or constipation per the patient's daughter.     In the ED it was noted the patient had slight tachycardia at 103 but otherwise normal vitals except for slightly lower BP at 104/56. Labs were remarkable  a potassium of 3.4, chloride of 115, glucose of 398, calcium of 10.6, a hemoglobin of 9.0. The patient was given tylenol, 10 units insulin, 1 liter LR and ativan 1 mg. MRI C/T/L spine were obtained. 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 Carney Living evaluated the MRI of those aforementioned regions and did not think any findings were contributing to her symptoms at this time.     Patient was  admitted to hospital with impression of failure to thrive and sacral ulcer concerning for osteomyelitis.  Was started on empiric antibiotics, ID and surgery was consulted.  Surgery saw the patient and recommended debridement.  ID recommended to continue empiric antibiotics.  Patient underwent sacral wound debridement and I&D in the OR.  Patient tolerated procedure well.  Patient was continued on empiric antibiotics.  Postoperatively patient had right-sided paresthesia.  Neurology was consulted and MRI was ordered.  MRI showed small lacunar infarct on the left side.  Neuro recommended aspirin and statin.  Patient followed by ID, cultures were followed and ID recommended antibiotics with vancomycin and Zosyn till 02/06/2022.  PICC line was placed for long-term IV antibiotics.  PT OT saw the patient and recommended rehab placement.      Pt sleepy lyimg down in bed after PT  Declined new c.o   No chest pain no SOB  No abdominal pain  No urinary sx      Objective:     Vital Signs:  Patient Vitals for the past 24 hrs:   BP Temp Temp src Pulse Resp SpO2   01/18/22 1632 (!) 151/70 97.4 F (36.3 C) Oral 77 16 100 %   01/18/22 0730 (!) 160/71 97.1 F (36.2 C) Oral 72 18 98 %   01/17/22 2311 -- -- -- --  16 --   01/17/22 2020 132/62 97.8 F (36.6 C) Oral 71 16 98 %        Physical Examination:  General:  Awake, alert  Cardiovascular:  S1S2+, RRR  Pulmonary:  CTA b/l  GI:  Soft, BS+, NT, ND  Extremities:  +Edema  Has sacral decubitus ulcer.  Has wound VAC in place    Current Medications:  Current Facility-Administered Medications   Medication Dose Route Frequency    amLODIPine (NORVASC) tablet 2.5 mg  2.5 mg Oral Daily    lactobacillus delayed release capsule 1 capsule  1 capsule Oral Daily with breakfast    insulin lispro (HUMALOG) injection vial 4 Units  4 Units SubCUTAneous TID WC    hydrALAZINE (APRESOLINE) tablet 10 mg  10 mg Oral Q6H PRN    vancomycin (VANCOCIN) 750 mg in sodium chloride 0.9 % 250 mL IVPB (vial-mate)  750  mg IntraVENous Q12H    ferrous sulfate (FE TABS 325) EC tablet 325 mg  325 mg Oral Daily with breakfast    insulin glargine (LANTUS) injection vial 32 Units  32 Units SubCUTAneous Daily    ondansetron (ZOFRAN-ODT) disintegrating tablet 4 mg  4 mg SubLINGual Q8H PRN    aspirin chewable tablet 81 mg  81 mg Oral Daily    atorvastatin (LIPITOR) tablet 80 mg  80 mg Oral Nightly    enoxaparin (LOVENOX) injection 40 mg  40 mg SubCUTAneous Daily    furosemide (LASIX) tablet 40 mg  40 mg Oral Daily    HYDROcodone-acetaminophen (NORCO) 5-325 MG per tablet 1 tablet  1 tablet Oral Q4H PRN    therapeutic multivitamin-minerals 1 tablet  1 tablet Oral Daily    glucose chewable tablet 16 g  4 tablet Oral PRN    dextrose bolus 10% 125 mL  125 mL IntraVENous PRN    Or    dextrose bolus 10% 250 mL  250 mL IntraVENous PRN    glucagon (rDNA) injection 1 mg  1 mg SubCUTAneous PRN    dextrose 10 % infusion   IntraVENous Continuous PRN    acetaminophen (TYLENOL) tablet 650 mg  650 mg Oral Q4H PRN    polyethylene glycol (GLYCOLAX) packet 17 g  17 g Oral Daily PRN    insulin lispro (HUMALOG) injection vial 0-8 Units  0-8 Units SubCUTAneous TID WC    insulin lispro (HUMALOG) injection vial 0-4 Units  0-4 Units SubCUTAneous Nightly    piperacillin-tazobactam (ZOSYN) 3,375 mg in sodium chloride 0.9 % 50 mL IVPB (mini-bag)  3,375 mg IntraVENous Q8H       Allergies:  Allergies   Allergen Reactions    Lactose Other (See Comments)     Intolerance-Pt denies       Lab/Data Review:  Recent Results (from the past 24 hour(s))   POCT Glucose    Collection Time: 01/17/22  9:39 PM   Result Value Ref Range    POC Glucose 159 (H) 70 - 110 mg/dL   Basic Metabolic Panel    Collection Time: 01/18/22  5:07 AM   Result Value Ref Range    Sodium 144 136 - 145 mmol/L    Potassium 3.3 (L) 3.5 - 5.5 mmol/L    Chloride 113 (H) 100 - 111 mmol/L    CO2 27 21 - 32 mmol/L    Anion Gap 4 3.0 - 18 mmol/L    Glucose 83 74 - 99 mg/dL    BUN 21 (H) 7.0 - 18 MG/DL    Creatinine  0.68 0.6 - 1.3 MG/DL    Bun/Cre Ratio 31 (H) 12 - 20      Est, Glom Filt Rate >60 >60 ml/min/1.6973m2    Calcium 10.6 (H) 8.5 - 10.1 MG/DL   Vancomycin Level, Random    Collection Time: 01/18/22  5:07 AM   Result Value Ref Range    Vancomycin Rm 18.4 5.0 - 40.0 UG/ML   POCT Glucose    Collection Time: 01/18/22  7:35 AM   Result Value Ref Range    POC Glucose 87 70 - 110 mg/dL   POCT Glucose    Collection Time: 01/18/22 11:55 AM   Result Value Ref Range    POC Glucose 196 (H) 70 - 110 mg/dL   POCT Glucose    Collection Time: 01/18/22  4:36 PM   Result Value Ref Range    POC Glucose 207 (H) 70 - 110 mg/dL         Assessment:     Primary Rehabilitation Diagnosis impaired mobility and ADLs in the setting of 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     Other Co-Morbid Conditions managed in Rehab   Cervical spondylosis with myelopathy, status post laminectomy and fusion of C3-C6 and external bone growth stimulator placement on Dec 09, 2021  Infected sacral decubitus ulcer-status post debridement  Type 2 diabetes with hyperglycemia  Hypertension  Hypomagnesemia  Anemia  Hypokalemia    Plan    Impaired mobility and ADLs in the setting of 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  Continue wound care and IV antibiotics zosyn and vancomycin . Follow up with ID (seen in hospital, noted recommended duration of ABX was until 02/06/22).  Routine PICC line care.       Type 2 diabetes with hyperglycemia   Continue Lantus insulin and prandial insulin  and sliding scale insulin.    Monitor sugars  Lantus  32 units daily (6/8).  Continue current regimen  Prandial insulin increased from 3 units TID to 4 units TID daily with meals  Hemoglobin A1c: 7.8 (6/5)     Hypertension   Has Hydralazine, 10mg  every 4 hours as needed added.  Looks like Amlodipine added 2.5mg  PO (6/12). Currently not listed in active medications.  Continue to monitor.     Hypomagnesemia  Magnesium 1.6 (6/6), 1.3 (6/4)  Not receiving Mg supplement currently, but getting MVI     Hypokalemia  Continue to monitor,      Anemia:  Continue ferrous sulfate, 325mg , PO  Monitor H&H  Hemoglobin: 8.4 (stable since admission)  Hematocrit: 27.7 (stable since admission)    Hypercalcemia, primary hyperparathyroidism  PTH: 97.2 (6/11)  Calcium down trending.  10.2 (6/13), 10.5 (6/9), 10.9 (6/8), 11 (6/7)  Nephrology saw (6/9) Dr. Mercer PodBichu. 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 Follows with Dr. Ovidio HangerLarocque. Recommended ionized calcium, PTH, and Vitamin D levels; alos check urine protein creatinine ratio, SPEP, and serum free light chains.  Nephrology signs off (6/12).  Follows  outpatient with endocrinologist    Vitamin D deficiency: mildly low, 27  Start supplement (none ordered)?  1,000 IU Vitamin D daily (Cholecalciferol). Recheck level x 3 months.       Functional Progress:    PHYSICAL THERAPY    ON ADMISSION MOST RECENT   Balance  Balance  Posture: Poor  Sitting - Static: Good  Sitting - Dynamic: Fair  Standing - Static: Fair, -  Standing - Dynamic: Poor                     Balance  Balance  Posture: Poor  Sitting - Static: Good  Sitting - Dynamic: Fair  Standing - Static: Fair, -  Standing - Dynamic: Poor                       Bed Mobility  Maximum assistance  Maximum assistance  Moderate assistance (Pt requird increased encouragement and time to transition to seated position.)  Moderate Assistance  Maximum assistance  Moderate assistance    Bed Mobility  Rolling to Right: Minimal assistance, Moderate  assistance  Rolling to Left: Minimal assistance, Moderate assistance (log roll technique)  Supine to Sit: Stand by assistance (Pt used log roll technique to get to EOB requiring increased time to complete.)  Sit to Stand: Contact guard assistance  Sit to Supine: Minimal assistance, Moderate assistance  Bed to Chair: Contact guard assistance      Wheelchair Mobility  Yes       Wheelchair Mobility  Propulsion: Yes         Ambulation  Rolling Walker  Moderate assistance  Level tile  12 Ambulation  Device: Agricultural consultant  Assistance: Stand by assistance  Surface: Level tile  Distance: 76 Feet   Stairs  No  Left ascending (side stepping with B UE support on hand rail)  Minimal assistance Stairs  Stairs?: Yes  Rails: Left ascending (side stepping with B UE support on hand rail)  Assistance: Minimal assistance     Functional Progress:    OCCUPATIONAL THERAPY    ON ADMISSION MOST RECENT   Eating  Supervision Eating  Feeding: Supervision   Grooming  Supervision, Setup (sitting EOB) Grooming  Grooming: Supervision, Setup   Bathing  UB Bathing Supervision, Setup (sitting EOB)  LB Bathing Maximum assistance Bathing  UB Bathing   UE Bathing: Supervision, Stand by assistance, Setup  LB Bathing   DVVO([id number]:[id number])@   Upper Body Dressing  Maximum assistance   Upper Body Dressing  UE Dressing: Supervision, Setup   Lower Body Dressing  Maximum assistance Lower Body Dressing  LE Dressing: Minimal assistance   Toileting  Maximum assistance Toileting  Toileting: Minimal assistance   Toilet Transfers  Moderate assistance Toilet Transfers  Toilet Transfer: Moderate assistance   Tub Transfers  Tub Transfers: Not tested  [place] Transfers: Not tested Tub Transfers  Tub Transfers  Tub Transfers: Not tested  SHOWER Transfers  Tub Transfers  Tub Transfers: Not tested     Legend:   7 - Independent   6 - Modified Independent   5 - Standby Assistance / Supervision / Set-up   4 - Minimum Assistance / Contact Guard  Assistance   3 - Moderate Assistance   2 - Maximum Assistance   1 - Total Assistance / Dependent             Plan:     1. Justification for continued stay: Good progression towards established rehabilitation  goals.    2. Medical Issues being followed closely:    [x]   Fall and safety precautions     []   Wound Care     [x]   Bowel and Bladder Function     [x]   Fluid Electrolyte and Nutrition Balance     []   Pain Control      3. Issues that 24 hour rehabilitation nursing is following:    [x]   Fall and safety precautions     []   Wound Care     [x]   Bowel and Bladder Function     [x]   Fluid Electrolyte and Nutrition Balance     []   Pain Control      [x]   Assistance with and education on in-room safety with transfers to and from the bed, wheelchair, toilet and shower.      4. Acute rehabilitation plan of care:    [x]   Continue current care and rehab.           [x]   Physical Therapy           [x]   Occupational Therapy           []   Speech Therapy     []   Hold Rehab until further notice     5. Medications:    [x]   MAR Reviewed     [x]   Continue Present Medications       6. Chemical DVT Prophylaxis:      []   Enoxaparin     []   Unfractionated Heparin     []   Warfarin     []   NOAC     []   Aspirin     []   None     7. Mechanical DVT Prophylaxis:      []   TED Stockings     []   Sequential Compression Device     []   None     8. GI Prophylaxis:      []   PPI     []   H2 Blocker     [x]   None / Not indicated     9. Code status:Full    Dragon medical dictation software was used for portions of this report. Unintended errors may occur.      Signed:    , MD      January 18, 2022

## 2022-01-18 NOTE — Progress Notes (Signed)
Pharmacy Pharmacokinetic Monitoring Service - Vancomycin    Consulting Provider: Jonah Blue, MD   Indication:  infected sacral decub.   Target Concentration: Goal AUC/MIC 400-600 mg*hr/L  Day of Therapy (Rehab): 13 of 31 (through 02/06/22 as per ID recommendations).  Additional Antimicrobials: Piperacillin/Tazobactam    Pertinent Laboratory Values:   Temp: 97.8 F (36.6 C), Weight - Scale: 152 lb 1.6 oz (69 kg)        Labs Renal Latest Ref Rng & Units 01/18/2022 01/16/2022 01/14/2022 01/10/2022 01/09/2022   BUN 7.0 - 18 MG/DL 19(J) 09(T) 26(Z) 12(W) 24(H)   Cr 0.6 - 1.3 MG/DL 5.80 9.98 3.38 2.50 5.39   K 3.5 - 5.5 mmol/L 3.3(L) 3.4(L) 3.5 3.6 3.6   Na 136 - 145 mmol/L 144 144 143 145 143        Estimated Creatinine Clearance: 76 mL/min (based on SCr of 0.68 mg/dL).    Pertinent Cultures:  Culture Date Source Results   05/27 Blood x 2 No growth   05/27 Wound, Sacral Proteus mirabilis (sens to Zosyn)  Enterococcus faecalis (sens to Vanc)     MRSA Nasal Swab: N/A. Non-respiratory infection    Assessment:  Date/Time Current Dose Concentration Timing of Concentration (h) AUC   6/05 1000 mg q12h 17.4 9.2h 415   6/06 1000 mg q12h - - -   06/07 " "      06/08 " "      06/09 " " 27.5 6 634   06/10  750 mg q12h - - -   06/11 " " 16.6 7 468   06/12 " " - - -   06/13 " " - - -   06/14 " " 18 7.5 480   06/15 " " - - -   06/16 " " 18.4 7.56 474   Note: Serum concentrations collected for AUC dosing may appear elevated if collected in close proximity to the dose administered, this is not necessarily an indication of toxicity    Plan:  Continue current dose of 750 mg IV q12h  Renal labs as indicated   Vancomycin concentration ordered for AM labs 01/21/22  Pharmacy will continue to monitor patient and adjust therapy as indicated    Thank you for the consult,    Raeanne Gathers, [facility]  01/18/2022

## 2022-01-18 NOTE — Plan of Care (Signed)
Problem: Occupational Therapy - Adult  Goal: By Discharge: Performs self-care activities at highest level of function for planned discharge setting.  See evaluation for individualized goals.  Description: Occupational Therapy Goals   Long Term Goals  Initiated 01/07/2022 and to be accomplished within 2-3 week(s) 01/28/2022  1. Pt will perform self-feeding with ModI.  2. Pt will perform grooming with S/Setup  3. Pt will perform UB bathing with S/Setup  4. Pt will perform LB bathing with Mina.  5. Pt will perform tub/shower transfer with [facility].  6. Pt will perform UB dressing with S/Setup  GM 01/15/22  7. Pt will perform LB dressing with Mina.  Continue for consistency  8. Pt will perform toileting task with [facility]. Continue for consistency  9. Pt will perform toilet transfer with [facility]. GM 01/15/22  10.  Pt will perform an IADL task with good safety and Mina.      Short Term Goals   Initiated 01/07/2022 and to be accomplished within 7 day(s) 01/14/2022; update 01/15/22  1. Pt will perform self-feeding with Setup.  GM 01/15/22  2. Pt will perform grooming with Setup.  GM 01/15/22  3. Pt will perform UB bathing with Setup. GM 01/15/22  4. Pt will perform LB bathing with [facility].  GM 01/15/22  5. Pt will perform tub/shower transfer with  [facility]. To be addressed at a later date  49. Pt will perform UB dressing with Mina.  GM 01/15/22  7. Pt will perform LB dressing with [facility]. GM 01/15/22  8. Pt will perform toileting task with [facility]. GM 01/15/22  9. Pt will perform toilet transfer with [facility].GM 01/15/22         Outcome: Progressing   Occupational Therapy TREATMENT    Patient: [name] Short   66 y.o.    Patient identified with name and DOB: yes    Date: 01/18/2022    First Tx Session  Time In: 1115  Time Out: 1215    Diagnosis: Acute CVA (cerebrovascular accident) [facility]) [I63.9]   Precautions: Restrictions/Precautions: Fall Risk, Surgical Protocols (Cervical Spinal Precautions)  Spinal Precautions: No Bending, No Lifting, No Twisting    Chart,  occupational therapy assessment, plan of care, and goals were reviewed.     Pain:  Pt reports  4/10 pain or discomfort prior to treatment; sacrum/ buttock area.  Pt reports  4/10 pain or discomfort post treatment; sacrum/ buttock area.  Intervention: repositioning; intermittent rest breaks. Opportunity for weight shift off of sacrum. Care coordinated with staff nurse regarding pain medication.      SUBJECTIVE:   Patient stated "I had a stroke back in 2014 that affected my right side, this last one affected my left."    OBJECTIVE DATA SUMMARY:   Sitting Balance: Supervision (seated at edge of bed)    THERAPEUTIC ACTIVITY Daily Assessment    Bed mobility performed (Min/ Mod A) with increased time using log roll technique. Therapist managing wound vac tubing and IV tubing during functional transition. Supine to sit transitioning performed (SBA) to seated position at edge of bed (transitioning towards pt's left); with increased time. Pt scooted forward to edge of bed with (SBA) and increased time. Verbal cues for spinal precautions. Pt engaged in RUE/ LUE there act in seated position at edge of bed; for [facility] (functional grasp), in-hand manipulation, bilateral integration, and coordination for carryover to self-care performance.      THERAPEUTIC EXERCISE Daily Assessment    RUE/ LUE there ex performed using 2 lb hand weights (10  reps x3) for bicep curls, chest press, IR/ ER, and (10 reps x1) for supination/ pronation. Verbal cues for positioning and technique. Intermittent rest breaks between sets due to fatigue. Performed for range of motion, strength/ endurance, and activity tolerance for carryover to self-cares.     RUE/ LUE there ex performed for reaching out-front to secure graded clothespins (25 clips) onto edge of tabletop container; followed by removal. Verbal cues for alternating upper extremities. Performed for functional reach out-front, sitting balance, to promote anterior weight shift, and for pinch strength  for increased functional independence with ADL's. Intermittent rest breaks due to fatigue.      EATING Daily Assessment   Functional Level Supervision   Clinical factors Pt performed self-feeding (set-up/ supv) in seated position at edge of bed for lunch tray; pt demonstrating ability to open lid and containers on tray with increased time.     GROOMING Daily Assessment   Functional Level Supervision;Setup   Clinical factors  Pt performed oral care at EOB with supervision and setup; ADL items set-up on tray table. Pt performed hand hygiene (set-up) using skin sanitizing wipe.     ASSESSMENT:  Patient improving slowly and progressing with self-care performance, generalized strength, sitting balance, and strategies for promoting safe functional participation during bed mobility/ transitions and ADL's. Pt limited at times due to medical complexity 2/2 wound vac and spinal precautions.   Progression toward goals:  []          Improving appropriately and progressing toward goals  [x]          Improving slowly and progressing toward goals  []          Not making progress toward goals and plan of care will be adjusted     PLAN:  Patient continues to benefit from skilled intervention to address the above impairments.  Continue treatment per established plan of care.  Discharge Recommendations:  Skilled nursing facility  Further Equipment Recommendations for Discharge:  TBD at next level of care     Activity Tolerance:  Activity Tolerance: Patient limited by fatigue     EDUCATION:   Education Given To: Patient  Education Provided: ADL Function;Safety;Energy Conservation;Precautions;Home Exercise Program  Education Method: Demonstration;Verbal  Barriers to Learning: Cognition  Education Outcome: Verbalized understanding;Demonstrated understanding;Continued education needed    COMMUNICATION:   [] Home safety education was provided and the patient/caregiver indicated understanding.  [x] Patient/family have participated as able  in goal setting and plan of care.  [x] Patient/family agree to work toward stated goals and plan of care.  [] Patient understands intent and goals of therapy, but is neutral about his/her participation.  [] Patient is unable to participate in goal setting and plan of care.    Please refer to the flowsheet for vital signs taken during this treatment.  After treatment:   []  Patient left in no apparent distress sitting up in chair   [x]  Patient left in no apparent distress in bed with needs met  [x]  Call bell left within reach  [x]  Nursing notified  []  Caregiver present  [x]  Bed alarm activated    Estimated LOS: 01/24/2022  Entered Differentiated Treatment minutes: yes    Garnette Czech, OT

## 2022-01-18 NOTE — Plan of Care (Signed)
Problem: Chronic Conditions and Co-morbidities  Goal: Patient's chronic conditions and co-morbidity symptoms are monitored and maintained or improved  Outcome: Progressing     Problem: Safety - Adult  Goal: Free from fall injury  01/18/2022 1036 by Orland Dec, RN  Outcome: Progressing  01/18/2022 0021 by Octavia Heir, RN  Outcome: Progressing     Problem: Skin/Tissue Integrity  Goal: Absence of new skin breakdown  Description: 1.  Monitor for areas of redness and/or skin breakdown  2.  Assess vascular access sites hourly  3.  Every 4-6 hours minimum:  Change oxygen saturation probe site  4.  Every 4-6 hours:  If on nasal continuous positive airway pressure, respiratory therapy assess nares and determine need for appliance change or resting period.  01/18/2022 1036 by Orland Dec, RN  Outcome: Progressing  01/18/2022 0021 by Octavia Heir, RN  Outcome: Progressing     Problem: ABCDS Injury Assessment  Goal: Absence of physical injury  01/18/2022 1036 by Orland Dec, RN  Outcome: Progressing  01/18/2022 0021 by Octavia Heir, RN  Outcome: Progressing     Problem: Pain  Goal: Verbalizes/displays adequate comfort level or baseline comfort level  01/18/2022 1036 by Orland Dec, RN  Outcome: Progressing  01/18/2022 0021 by Octavia Heir, RN  Outcome: Progressing     Problem: Nutrition Deficit:  Goal: Optimize nutritional status  Outcome: Progressing

## 2022-01-18 NOTE — Plan of Care (Signed)
Problem: Chronic Conditions and Co-morbidities  Goal: Patient's chronic conditions and co-morbidity symptoms are monitored and maintained or improved  01/18/2022 2205 by Elisabeth Pigeon, RN  Outcome: Progressing  01/18/2022 1036 by Orland Dec, RN  Outcome: Progressing  Flowsheets (Taken 01/18/2022 0800)  Care Plan - Patient's Chronic Conditions and Co-Morbidity Symptoms are Monitored and Maintained or Improved:   Monitor and assess patient's chronic conditions and comorbid symptoms for stability, deterioration, or improvement   Collaborate with multidisciplinary team to address chronic and comorbid conditions and prevent exacerbation or deterioration     Problem: Safety - Adult  Goal: Free from fall injury  01/18/2022 2205 by Elisabeth Pigeon, RN  Outcome: Progressing  01/18/2022 1036 by Orland Dec, RN  Outcome: Progressing     Problem: Skin/Tissue Integrity  Goal: Absence of new skin breakdown  Description: 1.  Monitor for areas of redness and/or skin breakdown  2.  Assess vascular access sites hourly  3.  Every 4-6 hours minimum:  Change oxygen saturation probe site  4.  Every 4-6 hours:  If on nasal continuous positive airway pressure, respiratory therapy assess nares and determine need for appliance change or resting period.  01/18/2022 2205 by Elisabeth Pigeon, RN  Outcome: Progressing  01/18/2022 1036 by Orland Dec, RN  Outcome: Progressing     Problem: ABCDS Injury Assessment  Goal: Absence of physical injury  01/18/2022 2205 by Elisabeth Pigeon, RN  Outcome: Progressing  01/18/2022 1036 by Orland Dec, RN  Outcome: Progressing     Problem: Pain  Goal: Verbalizes/displays adequate comfort level or baseline comfort level  01/18/2022 1036 by Orland Dec, RN  Outcome: Progressing

## 2022-01-19 LAB — POCT GLUCOSE
POC Glucose: 241 mg/dL — ABNORMAL HIGH (ref 70–110)
POC Glucose: 115 mg/dL — ABNORMAL HIGH (ref 70–110)
POC Glucose: 175 mg/dL — ABNORMAL HIGH (ref 70–110)
POC Glucose: 291 mg/dL — ABNORMAL HIGH (ref 70–110)

## 2022-01-19 MED FILL — HYDROCODONE-ACETAMINOPHEN 5-325 MG PO TABS: 5-325 MG | ORAL | Qty: 1

## 2022-01-19 MED FILL — VANCOMYCIN HCL 750 MG IV SOLR: 750 MG | INTRAVENOUS | Qty: 750

## 2022-01-19 MED FILL — FERROUS SULFATE 325 (65 FE) MG PO TBEC: 325 (65 Fe) MG | ORAL | Qty: 1

## 2022-01-19 MED FILL — THERAPEUTIC-M PO TABS: ORAL | Qty: 1

## 2022-01-19 MED FILL — PIPERACILLIN SOD-TAZOBACTAM SO 3.375 (3-0.375) G IV SOLR: 3.375 (3-0.375) g | INTRAVENOUS | Qty: 3375

## 2022-01-19 MED FILL — HUMALOG 100 UNIT/ML IJ SOLN: 100 UNIT/ML | INTRAMUSCULAR | Qty: 4

## 2022-01-19 MED FILL — BIO-K PLUS STRONG PO CPDR: ORAL | Qty: 1

## 2022-01-19 MED FILL — LANTUS 100 UNIT/ML SC SOLN: 100 UNIT/ML | SUBCUTANEOUS | Qty: 32

## 2022-01-19 MED FILL — NORVASC 5 MG PO TABS: 5 MG | ORAL | Qty: 1

## 2022-01-19 MED FILL — ATORVASTATIN CALCIUM 40 MG PO TABS: 40 MG | ORAL | Qty: 2

## 2022-01-19 MED FILL — ASPIRIN LOW DOSE 81 MG PO CHEW: 81 MG | ORAL | Qty: 1

## 2022-01-19 MED FILL — FUROSEMIDE 40 MG PO TABS: 40 MG | ORAL | Qty: 1

## 2022-01-19 MED FILL — LOVENOX 40 MG/0.4ML IJ SOSY: 40 MG/0.4ML | INTRAMUSCULAR | Qty: 0.4

## 2022-01-19 NOTE — Plan of Care (Signed)
Problem: Chronic Conditions and Co-morbidities  Goal: Patient's chronic conditions and co-morbidity symptoms are monitored and maintained or improved  01/19/2022 1050 by Orland Dec, RN  Outcome: Progressing  01/18/2022 2205 by Elisabeth Pigeon, RN  Outcome: Progressing     Problem: Safety - Adult  Goal: Free from fall injury  01/19/2022 1050 by Orland Dec, RN  Outcome: Progressing  01/18/2022 2205 by Elisabeth Pigeon, RN  Outcome: Progressing     Problem: Skin/Tissue Integrity  Goal: Absence of new skin breakdown  Description: 1.  Monitor for areas of redness and/or skin breakdown  2.  Assess vascular access sites hourly  3.  Every 4-6 hours minimum:  Change oxygen saturation probe site  4.  Every 4-6 hours:  If on nasal continuous positive airway pressure, respiratory therapy assess nares and determine need for appliance change or resting period.  01/19/2022 1050 by Orland Dec, RN  Outcome: Progressing  01/18/2022 2205 by Elisabeth Pigeon, RN  Outcome: Progressing     Problem: ABCDS Injury Assessment  Goal: Absence of physical injury  01/19/2022 1050 by Orland Dec, RN  Outcome: Progressing  01/18/2022 2205 by Elisabeth Pigeon, RN  Outcome: Progressing     Problem: Pain  Goal: Verbalizes/displays adequate comfort level or baseline comfort level  Outcome: Progressing

## 2022-01-19 NOTE — Plan of Care (Signed)
Problem: Safety - Adult  Goal: Free from fall injury  01/19/2022 2334 by Jobe Gibbon, RN  Outcome: Progressing  Flowsheets (Taken 01/19/2022 2331)  Free From Fall Injury: Instruct family/caregiver on patient safety  01/19/2022 1050 by Orland Dec, RN  Outcome: Progressing     Problem: Skin/Tissue Integrity  Goal: Absence of new skin breakdown  Description: 1.  Monitor for areas of redness and/or skin breakdown  2.  Assess vascular access sites hourly  3.  Every 4-6 hours minimum:  Change oxygen saturation probe site  4.  Every 4-6 hours:  If on nasal continuous positive airway pressure, respiratory therapy assess nares and determine need for appliance change or resting period.  01/19/2022 2334 by Jobe Gibbon, RN  Outcome: Progressing  01/19/2022 1050 by Orland Dec, RN  Outcome: Progressing     Problem: ABCDS Injury Assessment  Goal: Absence of physical injury  01/19/2022 2334 by Jobe Gibbon, RN  Outcome: Progressing  Flowsheets (Taken 01/19/2022 2331)  Absence of Physical Injury: Implement safety measures based on patient assessment  01/19/2022 1050 by Orland Dec, RN  Outcome: Progressing     Problem: Pain  Goal: Verbalizes/displays adequate comfort level or baseline comfort level  01/19/2022 2334 by Jobe Gibbon, RN  Outcome: Progressing  Flowsheets (Taken 01/19/2022 2000)  Verbalizes/displays adequate comfort level or baseline comfort level:   Encourage patient to monitor pain and request assistance   Assess pain using appropriate pain scale   Administer analgesics based on type and severity of pain and evaluate response   Implement non-pharmacological measures as appropriate and evaluate response   Consider cultural and social influences on pain and pain management  01/19/2022 1050 by Orland Dec, RN  Outcome: Progressing     Problem: Nutrition Deficit:  Goal: Optimize nutritional status  Outcome: Progressing

## 2022-01-19 NOTE — Progress Notes (Signed)
[facility]  [address], [place], [place] [address]     INPATIENT REHABILITATION  DAILY PROGRESS NOTE     Date: 01/19/2022    Name: [name] Short Age / Sex: 66 y.o. 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             Subjective:     Pt seen by me face to face today  Discussed with nursing staff pt up during meals has wound VACC in place   PICC line Lt arm  Stage 4 sacral decubitus improving       Objective:     Vital Signs:  Patient Vitals for the past 24 hrs:   BP Temp Temp src Pulse Resp SpO2   01/19/22 1609 136/69 97 F (36.1 C) Oral 74 18 100 %   01/19/22 0751 (!) 146/62 97.2 F (36.2 C) Oral 64 16 100 %   01/18/22 1945 (!) 153/65 98 F (36.7 C) Oral 77 16 100 %        Physical Examination:  General:  Awake, alert  Cardiovascular:  S1S2+, RRR  Pulmonary:  CTA b/l  GI:  Soft, BS+, NT, ND  Extremities:  +Edema  Has sacral decubitus ulcer.  Has wound VAC in place    Current Medications:  Current Facility-Administered Medications   Medication Dose Route Frequency    amLODIPine (NORVASC) tablet 2.5 mg  2.5 mg Oral Daily    lactobacillus delayed release capsule 1 capsule  1 capsule Oral Daily with breakfast    insulin lispro (HUMALOG) injection vial 4 Units  4 Units SubCUTAneous TID WC    hydrALAZINE (APRESOLINE) tablet 10 mg  10 mg Oral Q6H PRN    vancomycin (VANCOCIN) 750 mg in sodium chloride 0.9 % 250 mL IVPB (vial-mate)  750 mg IntraVENous Q12H    ferrous sulfate (FE TABS 325) EC tablet 325 mg  325 mg Oral Daily with breakfast    insulin glargine (LANTUS) injection vial 32 Units  32 Units SubCUTAneous Daily    ondansetron (ZOFRAN-ODT) disintegrating tablet 4 mg  4 mg SubLINGual Q8H PRN    aspirin chewable tablet 81 mg  81 mg Oral Daily    atorvastatin (LIPITOR) tablet 80 mg  80 mg Oral Nightly     enoxaparin (LOVENOX) injection 40 mg  40 mg SubCUTAneous Daily    furosemide (LASIX) tablet 40 mg  40 mg Oral Daily    HYDROcodone-acetaminophen (NORCO) 5-325 MG per tablet 1 tablet  1 tablet Oral Q4H PRN    therapeutic multivitamin-minerals 1 tablet  1 tablet Oral Daily    glucose chewable tablet 16 g  4 tablet Oral PRN    dextrose bolus 10% 125 mL  125 mL IntraVENous PRN    Or    dextrose bolus 10% 250 mL  250 mL IntraVENous PRN    glucagon (rDNA) injection 1 mg  1 mg SubCUTAneous PRN    dextrose 10 % infusion   IntraVENous Continuous PRN    acetaminophen (TYLENOL) tablet 650 mg  650 mg Oral Q4H PRN    polyethylene glycol (GLYCOLAX) packet 17 g  17 g Oral Daily PRN    insulin lispro (HUMALOG) injection vial 0-8 Units  0-8 Units SubCUTAneous TID WC    insulin lispro (HUMALOG) injection vial 0-4 Units  0-4  Units SubCUTAneous Nightly    piperacillin-tazobactam (ZOSYN) 3,375 mg in sodium chloride 0.9 % 50 mL IVPB (mini-bag)  3,375 mg IntraVENous Q8H       Allergies:  Allergies   Allergen Reactions    Lactose Other (See Comments)     Intolerance-Pt denies       Lab/Data Review:  Recent Results (from the past 24 hour(s))   POCT Glucose    Collection Time: 01/18/22  4:36 PM   Result Value Ref Range    POC Glucose 207 (H) 70 - 110 mg/dL   POCT Glucose    Collection Time: 01/18/22  8:29 PM   Result Value Ref Range    POC Glucose 241 (H) 70 - 110 mg/dL   POCT Glucose    Collection Time: 01/19/22  7:55 AM   Result Value Ref Range    POC Glucose 115 (H) 70 - 110 mg/dL   POCT Glucose    Collection Time: 01/19/22 11:48 AM   Result Value Ref Range    POC Glucose 175 (H) 70 - 110 mg/dL   POCT Glucose    Collection Time: 01/19/22  4:12 PM   Result Value Ref Range    POC Glucose 291 (H) 70 - 110 mg/dL         Assessment:     Primary Rehabilitation Diagnosis impaired mobility and ADLs in the setting of 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     Other Co-Morbid Conditions managed in Rehab   Cervical spondylosis with  myelopathy, status post laminectomy and fusion of C3-C6 and external bone growth stimulator placement on Dec 09, 2021  Infected sacral decubitus ulcer-status post debridement  Type 2 diabetes with hyperglycemia  Hypertension  Hypomagnesemia  Anemia  Hypokalemia    Plan    Impaired mobility and ADLs in the setting of 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  Continue wound care and IV antibiotics zosyn and vancomycin . Follow up with ID (seen in hospital, noted recommended duration of ABX was until 02/06/22).  Routine PICC line care.       Type 2 diabetes with hyperglycemia   Continue Lantus insulin and prandial insulin and sliding scale insulin.    Monitor sugars  Lantus  32 units daily (6/8).  Continue current regimen  Prandial insulin increased from 3 units TID to 4 units TID daily with meals  Hemoglobin A1c: 7.8 (6/5)     Hypertension   Has Hydralazine, 10mg  every 4 hours as needed added.  Looks like Amlodipine added 2.5mg  PO (6/12). Currently not listed in active medications.  Continue to monitor.     Hypomagnesemia  Magnesium 1.6 (6/6), 1.3 (6/4)  Not receiving Mg supplement currently, but getting MVI     Hypokalemia  Continue to monitor,      Anemia:  Continue ferrous sulfate, 325mg , PO  Monitor H&H  Hemoglobin: 8.4 (stable since admission)  Hematocrit: 27.7 (stable since admission)    Hypercalcemia, primary hyperparathyroidism  PTH:  97.2 (6/11)  Calcium down trending.  10.2 (6/13), 10.5 (6/9), 10.9 (6/8), 11 (6/7)  Nephrology saw  (6/9) Dr. Mercer Pod. 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 Follows with Dr. Ovidio Hanger. Recommended ionized calcium, PTH, and Vitamin D levels; alos check urine protein creatinine ratio, SPEP, and serum free light chains.  Nephrology signs off (6/12).  Follows outpatient with endocrinologist    Vitamin D deficiency: mildly low, 27  Start supplement (none ordered)?  1,000 IU Vitamin D daily (Cholecalciferol). Recheck level x 3 months.       Functional Progress:    PHYSICAL THERAPY    ON ADMISSION MOST RECENT   Balance  Balance  Posture: Poor  Sitting - Static: Good  Sitting - Dynamic: Fair  Standing - Static: Fair, -  Standing - Dynamic: Poor                     Balance  Balance  Posture: Poor  Sitting - Static: Good  Sitting - Dynamic: Fair  Standing - Static: Fair, -  Standing - Dynamic: Poor                       Bed Mobility  Maximum assistance  Maximum assistance  Moderate assistance (Pt requird increased encouragement and time to transition to seated position.)  Moderate Assistance  Maximum assistance  Moderate assistance    Bed Mobility  Rolling to Right: Minimal assistance, Moderate assistance  Rolling to Left: Minimal assistance, Moderate assistance (log roll technique)  Supine to Sit: Stand by assistance (Pt used log roll technique to get to EOB requiring increased time to complete.)  Sit to Stand: Contact guard assistance  Sit to Supine: Minimal assistance, Moderate assistance  Bed to Chair: Contact guard assistance      Wheelchair Mobility  Yes       Wheelchair Mobility  Propulsion: Yes         Ambulation  Rolling Walker  Moderate assistance  Level tile  12 Ambulation  Device: Agricultural consultant  Assistance: Stand by assistance  Surface: Level tile  Distance: 76 Feet   Stairs  No  Left ascending (side stepping with B UE support on hand rail)  Minimal assistance Stairs  Stairs?: Yes  Rails: Left ascending (side stepping with B UE support on hand rail)  Assistance: Minimal assistance     Functional  Progress:    OCCUPATIONAL THERAPY    ON ADMISSION MOST RECENT   Eating  Supervision Eating  Feeding: Supervision   Grooming  Supervision, Setup (sitting EOB) Grooming  Grooming: Supervision, Setup   Bathing  UB Bathing Supervision, Setup (sitting EOB)  LB Bathing Maximum assistance Bathing  UB Bathing   UE Bathing: Supervision, Stand by assistance, Setup  LB Bathing   VWUJ([id number])@   Upper Body Dressing  Maximum assistance   Upper Body Dressing  UE Dressing: Supervision, Setup   Lower Body Dressing  Maximum assistance Lower Body Dressing  LE Dressing: Minimal assistance   Toileting  Maximum assistance Toileting  Toileting: Minimal assistance   Toilet Transfers  Moderate assistance Toilet Transfers  Toilet Transfer: Moderate assistance   Tub Transfers  Tub Transfers: Not tested  [place] Transfers: Not tested Tub Transfers  Tub Transfers  Tub Transfers: Not tested  SHOWER Transfers  Tub Transfers  Tub Transfers: Not tested     Legend:   7 - Independent   6 - Modified Independent   5 - Standby Assistance /  Supervision / Set-up   4 - Minimum Assistance / Contact Guard Assistance   3 - Moderate Assistance   2 - Maximum Assistance   1 - Total Assistance / Dependent             Plan:     1. Justification for continued stay: Good progression towards established rehabilitation goals.    2. Medical Issues being followed closely:    [x]   Fall and safety precautions     []   Wound Care     [x]   Bowel and Bladder Function     [x]   Fluid Electrolyte and Nutrition Balance     []   Pain Control      3. Issues that 24 hour rehabilitation nursing is following:    [x]   Fall and safety precautions     []   Wound Care     [x]   Bowel and Bladder Function     [x]   Fluid Electrolyte and Nutrition Balance     []   Pain Control      [x]   Assistance with and education on in-room safety with transfers to and from the bed, wheelchair, toilet and shower.      4. Acute rehabilitation plan of care:    [x]   Continue current  care and rehab.           [x]   Physical Therapy           [x]   Occupational Therapy           []   Speech Therapy     []   Hold Rehab until further notice     5. Medications:    [x]   MAR Reviewed     [x]   Continue Present Medications       6. Chemical DVT Prophylaxis:      []   Enoxaparin     []   Unfractionated Heparin     []   Warfarin     []   NOAC     []   Aspirin     []   None     7. Mechanical DVT Prophylaxis:      []   TED Stockings     []   Sequential Compression Device     []   None     8. GI Prophylaxis:      []   PPI     []   H2 Blocker     [x]   None / Not indicated     9. Code status:Full    Dragon medical dictation software was used for portions of this report. Unintended errors may occur.      Signed:    , MD      January 19, 2022

## 2022-01-20 LAB — BASIC METABOLIC PANEL
Anion Gap: 3 mmol/L (ref 3.0–18)
BUN/Creatinine Ratio: 34 — ABNORMAL HIGH (ref 12–20)
BUN: 25 mg/dL — ABNORMAL HIGH (ref 7.0–18)
CO2: 28 mmol/L (ref 21–32)
Calcium: 10.7 mg/dL — ABNORMAL HIGH (ref 8.5–10.1)
Chloride: 113 mmol/L — ABNORMAL HIGH (ref 100–111)
Creatinine: 0.74 mg/dL (ref 0.6–1.3)
Est, Glom Filt Rate: >60 mL/min/{1.73_m2} (ref >60)
Glucose: 161 mg/dL — ABNORMAL HIGH (ref 74–99)
Potassium: 3.5 mmol/L (ref 3.5–5.5)
Sodium: 144 mmol/L (ref 136–145)

## 2022-01-20 LAB — POCT GLUCOSE
POC Glucose: 165 mg/dL — ABNORMAL HIGH (ref 70–110)
POC Glucose: 229 mg/dL — ABNORMAL HIGH (ref 70–110)
POC Glucose: 239 mg/dL — ABNORMAL HIGH (ref 70–110)
POC Glucose: 258 mg/dL — ABNORMAL HIGH (ref 70–110)

## 2022-01-20 MED FILL — PIPERACILLIN SOD-TAZOBACTAM SO 3.375 (3-0.375) G IV SOLR: 3.375 (3-0.375) g | INTRAVENOUS | Qty: 3375

## 2022-01-20 MED FILL — VANCOMYCIN HCL 750 MG IV SOLR: 750 MG | INTRAVENOUS | Qty: 750

## 2022-01-20 MED FILL — BIO-K PLUS STRONG PO CPDR: ORAL | Qty: 1

## 2022-01-20 MED FILL — NORVASC 5 MG PO TABS: 5 MG | ORAL | Qty: 1

## 2022-01-20 MED FILL — LANTUS 100 UNIT/ML SC SOLN: 100 UNIT/ML | SUBCUTANEOUS | Qty: 32

## 2022-01-20 MED FILL — HUMALOG 100 UNIT/ML IJ SOLN: 100 UNIT/ML | INTRAMUSCULAR | Qty: 4

## 2022-01-20 MED FILL — FERROUS SULFATE 325 (65 FE) MG PO TBEC: 325 (65 Fe) MG | ORAL | Qty: 1

## 2022-01-20 MED FILL — ASPIRIN LOW DOSE 81 MG PO CHEW: 81 MG | ORAL | Qty: 1

## 2022-01-20 MED FILL — HYDROCODONE-ACETAMINOPHEN 5-325 MG PO TABS: 5-325 MG | ORAL | Qty: 1

## 2022-01-20 MED FILL — LOVENOX 40 MG/0.4ML IJ SOSY: 40 MG/0.4ML | INTRAMUSCULAR | Qty: 0.4

## 2022-01-20 MED FILL — ATORVASTATIN CALCIUM 40 MG PO TABS: 40 MG | ORAL | Qty: 2

## 2022-01-20 MED FILL — THERAPEUTIC-M PO TABS: ORAL | Qty: 1

## 2022-01-20 MED FILL — FUROSEMIDE 40 MG PO TABS: 40 MG | ORAL | Qty: 1

## 2022-01-20 NOTE — Progress Notes (Signed)
 [facility]  [address] [place], [place] [address]     INPATIENT REHABILITATION  DAILY PROGRESS NOTE     Date: 01/20/2022    Name: [name] Short Age / Sex: 66 y.o. / female   CSN: [id number] MRN: [id number]   Admit Date: 01/06/2022 Length of Stay: 14 days     Primary Rehabilitation Diagnosis impaired mobility and ADLs in the setting of 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         Subjective:     I personally saw and evaluated this patient face-to-face on January 20, 2022.  Patient is sitting in a chair in no apparent distress, awake and alert.  Daughter is at bedside    Objective:     Vital Signs:  Patient Vitals for the past 24 hrs:   BP Temp Temp src Pulse Resp SpO2   01/20/22 1645 (!) 155/70 97.3 F (36.3 C) Oral 72 16 --   01/20/22 0715 (!) 151/70 97.2 F (36.2 C) Oral 65 16 100 %   01/20/22 0445 -- -- -- -- 18 --   01/19/22 1939 (!) 141/60 97.7 F (36.5 C) Oral 73 18 100 %        Physical Examination:  General:  Awake, alert  Cardiovascular:  S1S2+, RRR  Pulmonary:  CTA b/l  GI:  Soft, BS+, NT, ND  Extremities:  + edema.  Patient is wearing graded compression stockings  Has sacral decubitus ulcer.  Wound VAC in place    Current Medications:  Current Facility-Administered Medications   Medication Dose Route Frequency    amLODIPine  (NORVASC ) tablet 2.5 mg  2.5 mg Oral Daily    lactobacillus delayed release capsule 1 capsule  1 capsule Oral Daily with breakfast    insulin  lispro (HUMALOG ) injection vial 4 Units  4 Units SubCUTAneous TID WC    hydrALAZINE  (APRESOLINE ) tablet 10 mg  10 mg Oral Q6H PRN    vancomycin  (VANCOCIN ) 750 mg in sodium chloride  0.9 % 250 mL IVPB (vial-mate)  750 mg IntraVENous Q12H    ferrous sulfate  (FE TABS 325) EC tablet 325 mg  325 mg Oral Daily with breakfast    insulin  glargine (LANTUS ) injection vial 32 Units  32 Units SubCUTAneous Daily    ondansetron  (ZOFRAN -ODT) disintegrating tablet 4 mg  4 mg SubLINGual Q8H PRN    aspirin  chewable tablet  81 mg  81 mg Oral Daily    atorvastatin  (LIPITOR ) tablet 80 mg  80 mg Oral Nightly    enoxaparin  (LOVENOX ) injection 40 mg  40 mg SubCUTAneous Daily    furosemide  (LASIX ) tablet 40 mg  40 mg Oral Daily    HYDROcodone -acetaminophen  (NORCO ) 5-325 MG per tablet 1 tablet  1 tablet Oral Q4H PRN    therapeutic multivitamin-minerals 1 tablet  1 tablet Oral Daily    glucose chewable tablet 16 g  4 tablet Oral PRN    dextrose  bolus 10% 125 mL  125 mL IntraVENous PRN    Or    dextrose  bolus 10% 250 mL  250 mL IntraVENous PRN    glucagon  (rDNA) injection 1 mg  1 mg SubCUTAneous PRN    dextrose  10 % infusion   IntraVENous Continuous PRN    acetaminophen  (TYLENOL ) tablet 650 mg  650 mg Oral Q4H PRN    polyethylene glycol (GLYCOLAX ) packet 17 g  17 g Oral Daily PRN    insulin  lispro (HUMALOG ) injection vial 0-8 Units  0-8 Units SubCUTAneous TID WC  insulin  lispro (HUMALOG ) injection vial 0-4 Units  0-4 Units SubCUTAneous Nightly    piperacillin -tazobactam (ZOSYN ) 3,375 mg in sodium chloride  0.9 % 50 mL IVPB (mini-bag)  3,375 mg IntraVENous Q8H       Allergies:  Allergies   Allergen Reactions    Lactose Other (See Comments)     Intolerance-Pt denies       Lab/Data Review:  Recent Results (from the past 24 hour(s))   POCT Glucose    Collection Time: 01/19/22  8:25 PM   Result Value Ref Range    POC Glucose 229 (H) 70 - 110 mg/dL   Basic Metabolic Panel    Collection Time: 01/20/22  5:55 AM   Result Value Ref Range    Sodium 144 136 - 145 mmol/L    Potassium 3.5 3.5 - 5.5 mmol/L    Chloride 113 (H) 100 - 111 mmol/L    CO2 28 21 - 32 mmol/L    Anion Gap 3 3.0 - 18 mmol/L    Glucose 161 (H) 74 - 99 mg/dL    BUN 25 (H) 7.0 - 18 MG/DL    Creatinine 4.09 0.6 - 1.3 MG/DL    Bun/Cre Ratio 34 (H) 12 - 20      Est, Glom Filt Rate >60 >60 ml/min/1.45m2    Calcium 10.7 (H) 8.5 - 10.1 MG/DL   POCT Glucose    Collection Time: 01/20/22  7:24 AM   Result Value Ref Range    POC Glucose 165 (H) 70 - 110 mg/dL   POCT Glucose    Collection Time:  01/20/22 12:36 PM   Result Value Ref Range    POC Glucose 239 (H) 70 - 110 mg/dL   POCT Glucose    Collection Time: 01/20/22  5:07 PM   Result Value Ref Range    POC Glucose 258 (H) 70 - 110 mg/dL         Assessment:     Primary Rehabilitation Diagnosis impaired mobility and ADLs in the setting of 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     Other Co-Morbid Conditions managed in Rehab   Cervical spondylosis with myelopathy, status post laminectomy and fusion of C3-C6 and external bone growth stimulator placement on Dec 09, 2021  Infected sacral decubitus ulcer-status post debridement  Type 2 diabetes with hyperglycemia  Hypertension  Hypomagnesemia  Anemia  Hypokalemia    PLAN  -impaired mobility and ADLs in the setting of 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   Aspirin  and statin  PT OT and speech therapy    -Cervical spondylosis with myelopathy, status post laminectomy and fusion of C3-C6 and external bone growth stimulator placement on Dec 09, 2021-PT and OT.  Judicious pain control.  Continue bowel regimen        -Infected sacral decubitus ulcer-status post debridement.  Continue wound care and IV antibiotics.  Routine PICC line care.  Wound care and wound VAC       -Type 2 diabetes with hyperglycemia.  Continue Lantus  insulin  and prandial insulin  and sliding scale insulin .  Monitor sugars  On January 09, 2022 I increased Lantus  from 30 units to 32 units daily, monitor.  Continue current regimen  On January 11, 2022 I have increased the prandial insulin  from 3 units 3 times daily to 4 units 3 times daily with meals  Some low sugars noted this morning.  Will decrease Lantus  back to 30 units subcu daily.    Continue to monitor sugars  Continue Lantus  32 units subcu daily, monitor sugars     -Hypertension-blood pressure is trending up.  Add as needed hydralazine   Blood pressure is trending high.  On January 13, 2022 I have added amlodipine  2.5 mg p.o. daily.    Continue amlodipine  2.5 mg p.o. daily      -Hypomagnesemia-repleted    -Hypokalemia  On January 07, 2022 potassium was 3.0.  Repleted  On January 08, 2022 potassium is 3.7  On January 09, 2022 potassium was 3.6  On January 10, 2022 potassium is 3.6  On January 14, 2022 potassium is 3.5  On January 16, 2022 potassium was 3.4.  Will replete     -Anemia-continue ferrous sulfate , monitor  On January 07, 2022 hemoglobin is 8.3  On January 14, 2022 hemoglobin is 8.4    -Hypercalcemia, primary hyperparathyroidism  On January 07, 2022 calcium was 10.6  On January 08, 2022 calcium is 78  On January 09, 2022 calcium is 10.9  On January 10, 2022 calcium is 10.5  On January 14, 2022 calcium is 10.2  Patient follows with endocrinologist as an outpatient     Fall precautions, aspiration precautions  I discussed with patient and daughter at bedside.  I discussed with Child psychotherapist    Functional Progress:    PHYSICAL THERAPY    ON ADMISSION MOST RECENT   Balance  Balance  Posture: Poor  Sitting - Static: Good  Sitting - Dynamic: Fair  Standing - Static: Fair, -  Standing - Dynamic: Poor  Poor  Good  Fair  Fair;-  Poor      Balance  Balance  Posture: Poor  Sitting - Static: Good  Sitting - Dynamic: Fair  Standing - Static: Fair, -  Standing - Dynamic: Poor  Posture: Poor  Sitting - Static: Good  Sitting - Dynamic: Fair  Standing - Static: Fair;-  Standing - Dynamic: Poor        Bed Mobility  Maximum assistance  Maximum assistance  Moderate assistance (Pt requird increased encouragement and time to transition to seated position.)  Moderate Assistance  Maximum assistance  Moderate assistance    Bed Mobility  Rolling to Right: Minimal assistance, Moderate assistance  Rolling to Left: Minimal assistance, Moderate assistance (log roll technique)  Supine to Sit: Minimal assistance (with increased time, maximal verbal reminders for log roll and HOB elevated 20 degrees)  Sit to Stand: Contact guard assistance  Sit to Supine: Minimal assistance, Moderate assistance  Bed to Chair: Contact guard assistance      Wheelchair  Mobility  Yes       Wheelchair Mobility  Propulsion: Yes         Ambulation  Rolling Walker  Moderate assistance  Level tile  12 Ambulation  Device: Agricultural consultant  Assistance: Stand by assistance  Surface: Level tile  Distance: 110 Feet   Stairs  No  Left ascending (side stepping with B UE support on hand rail)  Minimal assistance Stairs  Stairs?: Yes  Rails: Left ascending (side stepping with B UE support on hand rail)  Assistance: Minimal assistance     Functional Progress:    OCCUPATIONAL THERAPY    ON ADMISSION MOST RECENT   Eating  Supervision Eating  Feeding: Supervision   Grooming  Supervision, Setup (sitting EOB) Grooming  Grooming: Supervision, Setup   Bathing  UB Bathing Supervision, Setup (sitting EOB)  LB Bathing Maximum assistance Bathing  UB Bathing   UE Bathing: Supervision, Stand by assistance, Setup  LB Bathing  WGNF([id number])@   Upper Body Dressing  Maximum assistance   Upper Body Dressing  UE Dressing: Supervision, Setup   Lower Body Dressing  Maximum assistance Lower Body Dressing  LE Dressing: Minimal assistance   Toileting  Maximum assistance Toileting  Toileting: Minimal assistance   Toilet Transfers  Moderate assistance Toilet Transfers  Toilet Transfer: Moderate assistance   Tub Transfers  Tub Transfers: Not tested  [place] Transfers: Not tested Tub Transfers  Tub Transfers  Tub Transfers: Not tested  SHOWER Transfers  Tub Transfers  Tub Transfers: Not tested     Legend:   7 - Independent   6 - Modified Independent   5 - Standby Assistance / Supervision / Set-up   4 - Minimum Assistance / Contact Guard Assistance   3 - Moderate Assistance   2 - Maximum Assistance   1 - Total Assistance / Dependent             Plan:     1. Justification for continued stay: Fair progression towards established rehabilitation goals.  Patient is now able to ambulate 110 feet with a rolling walker and standby assist in comparison to being able to ambulate 12 feet with a rolling walker  and moderate assistance at the time of admission    2. Medical Issues being followed closely:    [x]   Fall and safety precautions     [x]   Wound Care     [x]   Bowel and Bladder Function     [x]   Fluid Electrolyte and Nutrition Balance     [x]   Pain Control      3. Issues that 24 hour rehabilitation nursing is following:    [x]   Fall and safety precautions     [x]   Wound Care     [x]   Bowel and Bladder Function     [x]   Fluid Electrolyte and Nutrition Balance     [x]   Pain Control      [x]   Assistance with and education on in-room safety with transfers to and from the bed, wheelchair, toilet and shower.      4. Acute rehabilitation plan of care:    [x]   Continue current care and rehab.           [x]   Physical Therapy           [x]   Occupational Therapy           [x]   Speech Therapy     []   Hold Rehab until further notice     5. Medications:    [x]   MAR Reviewed     [x]   Continue Present Medications       6. Chemical DVT Prophylaxis:      [x]   Enoxaparin      []   Unfractionated Heparin      []   Warfarin     []   NOAC     []   Aspirin      []   None     7. Mechanical DVT Prophylaxis:      [x]   TED Stockings     []   Sequential Compression Device     []   None     8. GI Prophylaxis:      []   PPI     []   H2 Blocker     [x]   None / Not indicated     9. Code status:Full    Dragon medical dictation software was used for portions of this report. Unintended errors may  occur.      Signed:    Halbert Levee, MD      January 20, 2022

## 2022-01-20 NOTE — Plan of Care (Addendum)
 Problem: Physical Therapy - Adult  Goal: By Discharge: Performs mobility at highest level of function for planned discharge setting.  See evaluation for individualized goals.  Description: Physical Therapy Short Term Goals  Initiated 01/07/2022, reassessed 01/14/2022, and to be accomplished within 7 day(s) (01/21/2022)  1.  Patient will supine to sit, sit to supine, roll right, and roll left  in bed with moderate assistance .  MET 01/14/2022   Updated Goal: Patient will supine to sit, sit to supine, roll right, and roll left  in bed with minimal assistance.  2.  Patient will transfer from bed to chair and chair to bed with minimal assistance/contact guard assist using the least restrictive device.  MET 01/14/2022   Updated Goal:  Patient will transfer from bed to chair and chair to bed with supervision using the least restrictive device.  3.  Patient will perform sit to stand with minimal assistance/contact guard assist.  MET 01/14/2022   Updated Goal:  Patient will perform sit to stand with supervision.  4.  Patient will ambulate with minimal assistance/contact guard assist for 50 feet with the least restrictive device.  MET 01/14/2022   Updated Goal:  Patient will ambulate with supervision for 150 feet with the least restrictive device.   5.  Patient will ascend/descend 3 stairs with B handrail(s) with minimal assistance/contact guard assist.  Not Met - Ongoing    Physical Therapy Long Term Goals  Initiated 01/07/2022 and to be accomplished within 14-21 day(s) (01/28/2022)  1.  Patient will supine to sit, sit to supine, roll right, and roll left in bed with supervision/set-up.    2.  Patient will transfer from bed to chair and chair to bed with supervision/set-up using the least restrictive device.  3.  Patient will perform sit to stand with supervision/set-up.  4.  Patient will ambulate with supervision/set-up for 150 feet with the least restrictive device.   5.  Patient will ascend/descend 4 stairs with 1 handrail(s)  with supervision/set-up      Outcome: Progressing       PHYSICAL THERAPY TREATMENT    Patient: [name] Short (66 y.o. female)  Date: 01/20/2022  Diagnosis: Acute CVA (cerebrovascular accident) [facility]) [I63.9]   Precautions: Cervical Spinal Precautions, Fall Risk Precautions  Chart, physical therapy assessment, plan of care and goals were reviewed.    Time in: 0800  Time out : 0900    Patient seen for: Patient Education, Teacher, early years/pre, Investment banker, operational, Balance Training    Time in: 1030  Time out : 1105    Patient seen for: Patient Education, Teacher, early years/pre, Therapeutic Exercises    Pain:  Pt pain was reported as 0/10 at rest pre-treatment.  Pt pain was reported as 7/10 with mobility post-treatment.  Intervention: Educated pt re: repositioning opportunity and pt's nursing staff notified and aware.    Patient identified with name and DOB: Yes    SUBJECTIVE:      Pt reports she did not see her daughter much over the weekend and she wasn't sure when she would be back in.    OBJECTIVE DATA SUMMARY:    Objective:   Education: Education Given To: Patient  Education Provided: Role of Therapy;Plan of Care;Precautions;Safety;Transfer Training;Energy Administrator, sports;Fall Prevention Strategies  Education Method: Demonstration;Verbal  Barriers to Learning: Cognition  Education Outcome: Verbalized understanding;Demonstrated understanding;Continued education needed  BED/MAT MOBILITY Daily Assessment    Supine to Sit Minimal assistance (with increased time, maximal verbal reminders for log roll and HOB elevated 20 degrees)  Pt required increased time to initiate this treatment session, max verbal cues and assistance at pelvis to maintain spinal precautions.      TRANSFERS Daily Assessment    Sit to Stand Contact guard assistance    Transfer Assist Score    Contact guard assistance          Comments Pt requires increased time to perform and CGA for safety as pt at times does not fully clear sacrum with lift off for sit  to stand due to c/o sacral pain requiring multiple attempts with tactile cuing for anterior weight shift.  Pt requires CGA for safety and balance in standing with moderate verbal cues for safe RW management with stand step transfer which pt performs with wide BOS.        GAIT Daily Assessment    Gait Deviations Decreased step height;Decreased step length    Assistive device Rolling Walker    Ambulation assistance - surface  Stand by assistance  Level tile    Distance 110 Feet    Comments Pt ambulates with decreased right stance time and weight shift with intermittent left toe drag/forefoot initial contact with advancing left LE through swing phase requiring moderate verbal cuing for safety and foot clearance.  Pt also requires maximal verbal cues for forward gaze and to visual scan environment without head turning.        BALANCE Daily Assessment    Posture Poor    Sitting - Static Good    Sitting - Dynamic Fair    Standing - Static Fair;-    Standing - Dynamic Poor      WHEELCHAIR MOBILITY/MANAGEMENT Daily Assessment   Able to Propel 150 Feet   Assist Level Supervision   Curbs/ramps assistance required  NT   Wheelchair management manages B brakes with brake extender   Comments Pt requires supervision with verbal cues for efficient propulsion with B UE     THERAPEUTIC EXERCISES Daily Assessment     2 Sets of 10 Repetitions:  AROM Right and Left LAQ  AROM Right and Left Hip Flexion        ASSESSMENT:  Pt is progressing with functional mobility requiring decreased physical assistance overall.  However, pt continues to demonstrate postural dysfunction and functional strength impairments limiting safety and independence with mobility.  Progression toward goals:  []       Improving appropriately and progressing toward goals  [x]       Improving slowly and progressing toward goals  []       Not making progress toward goals and plan of care will be adjusted      PLAN:  Patient continues to benefit from skilled intervention to  address the above impairments.  Continue treatment per established plan of care.  Emphasize postural re-education, NMRE, strength and balance training to promote safe d/c.  Discharge Recommendations:  Home with Home health PT;24 hour supervision or assist;Subacute/Skilled Nursing Facility  Further Equipment Recommendations for Discharge:        Rolling Walker             Estimated Discharge Date: 01/24/2022    Activity Tolerance:   Fair  Please refer to the flowsheet for vital signs taken during this treatment.    After treatment:   []  Patient left in no apparent distress in bed  [x]  Patient left in no apparent distress sitting up in chair  [x]  Patient left in no apparent distress sitting up on commode with direct hand-off to OT after second treatment  session.  []  Patient left in no apparent distress in w/c mobilizing under own power  []  Patient left in no apparent distress dining area  []  Patient left in no apparent distress mobilizing under own power  []  Call bell left within reach  [x]  Nursing notified  []  Caregiver present  []  Bed alarm activated   [x]  Chair alarm activated      Gwyn Leos, PT, DPT  01/20/2022

## 2022-01-20 NOTE — Progress Notes (Signed)
Pharmacy Pharmacokinetic Monitoring Service - Vancomycin    Consulting Provider: Jonah Blue, MD   Indication:  infected sacral decub.   Target Concentration: Goal AUC/MIC 400-600 mg*hr/L  Day of Therapy (Rehab): 15 of 31 (through 02/06/22 as per ID recommendations).  Additional Antimicrobials: Piperacillin/Tazobactam    Pertinent Laboratory Values:   Temp: 97.7 F (36.5 C), Weight - Scale: 152 lb 1.6 oz (69 kg)        Labs Renal Latest Ref Rng & Units 01/20/2022 01/18/2022 01/16/2022 01/14/2022 01/10/2022   BUN 7.0 - 18 MG/DL 25(K) 27(C) 62(B) 76(E) 20(H)   Cr 0.6 - 1.3 MG/DL 8.31 5.17 6.16 0.73 7.10   K 3.5 - 5.5 mmol/L 3.5 3.3(L) 3.4(L) 3.5 3.6   Na 136 - 145 mmol/L 144 144 144 143 145        Estimated Creatinine Clearance: 70 mL/min (based on SCr of 0.74 mg/dL).    Pertinent Cultures:  Culture Date Source Results   05/27 Blood x 2 No growth   05/27 Wound, Sacral Proteus mirabilis (sens to Zosyn)  Enterococcus faecalis (sens to Vanc)     MRSA Nasal Swab: N/A. Non-respiratory infection    Assessment:  Date/Time Current Dose Concentration Timing of Concentration (h) AUC   6/05 1000 mg q12h 17.4 9.2h 415   6/06 1000 mg q12h - - -   06/07 " "      06/08 " "      06/09 " " 27.5 6 634   06/10  750 mg q12h - - -   06/11 " " 16.6 7 468   06/12 " " - - -   06/13 " " - - -   06/14 " " 18 7.5 480   06/15 " " - - -   06/16 " " 18.4 7.56 474   06/17 " " - - -   06/18 " " - - -   Note: Serum concentrations collected for AUC dosing may appear elevated if collected in close proximity to the dose administered, this is not necessarily an indication of toxicity    Plan:  Continue current dose of 750 mg IV q12h  Renal labs as indicated   Vancomycin concentration ordered for AM labs 01/21/22  Pharmacy will continue to monitor patient and adjust therapy as indicated    Thank you for the consult,    Raeanne Gathers, [facility]  01/20/2022

## 2022-01-20 NOTE — Plan of Care (Signed)
Problem: SLP Adult - Disturbed Thought Process  Goal: By Discharge: Demonstrates cognitive skills at highest level of function for planned discharge setting.   See evaluation for individualized goals.  Description: Long term goals (Initiated 01/07/22 to be accomplished by 01/21/2022)  Patient will:  1. Be oriented x 3 and recall events of the day, supervision.  2. Follow 3 part and complex commands, 90% accuracy.  3.  Listen to 3-4 sentence length paragraphs and respond to questions re: content with 90% accuracy.  4.  List 10-12 items in categories of increasing abstraction, supervision.  5.  Discuss similarities/differences given 2 items, 80-90% accuracy.    Short term goals (by 01/21/22)  Patient will:  1.  Be oriented x 3 and recall events of the day, min assist.  2.  Follow 3 part and complex commands, 70-60% accuracy.  3.  Listen to 3-4 sentence length paragraphs and respond to questions re: content with 70-80% accuracy.  4.  List 10-12 items in categories of increasing abstraction, min assist.  5.  Discuss similarities/differences given 2 items, 70-80% accuracy.      Note:   SPEECH-LANGUAGE TREATMENT    Patient: Charlisa Cham (66 y.o. female)  Date: 01/20/2022  Diagnosis: Acute CVA (cerebrovascular accident) [facility]) [I63.9] Acute CVA (cerebrovascular accident) (HCC)      Precautions: Standard  PLOF: As per H&P     ASSESSMENT:  Mrs. Suchecki is consistently alert and engaged in sessions.  Progression toward goals:  [x]        Improving appropriately and progressing toward goals  []        Improving slowly and progressing toward goals  []        Not making progress toward goals and plan of care will be adjusted     PLAN:  Patient continues to benefit from skilled intervention to address the above impairments.  Continue treatment per established plan of care.     SUBJECTIVE:   Patient stated "Now I don't know the answer to that one.".    OBJECTIVE:   Mental Status:  Mrs. Brierley was awake an actively participated in treatment  tasks.  Treatment & Interventions:  Neuro-Linguistics:  Orientation:  Independent  Recall 3 words: Supervision  "Chat Pack":  Patient was engaged and answered appropriately to questions.     More than once, her responses were quite humorous.    Voice:  T her baseline    Response & Tolerance to Activities:  Mrs. Romano is consistently an active participant in treatment sessions.  She is also conversant in the dining room at lunchtime.    PAIN:  Start of Tx: No complaint of pain  End of Tx: No complaint of pain     After treatment:   [x]        Patient left in no apparent distress sitting up in chair  []        Patient left in no apparent distress in bed  [x]        Call bell left within reach  [x]        Nursing notified  []        Caregiver present  []        Bed alarm activated      COMMUNICATION/EDUCATION:   []  Patient educated regarding compensatory speech/language/comprehension techniques provided via demonstration, verbalization and teach back of comprehension  [x]  Patient/family have participated as able in goal setting and plan of care.  [x]  Patient/family agree to work toward stated goals and plan of care.  []  Patient  understands intent and goals of therapy, neutral about participation.  []  Patient unable to participate in goal setting/plan of care secondary to cognition, hearing/vision deficits; education ongoing with interdisciplinary staff       , SLP

## 2022-01-20 NOTE — Progress Notes (Signed)
Pt has been accepted to [facility] pending insurance auth. Pt states understanding and notes that she will update her daughter.     Sw will follow.

## 2022-01-20 NOTE — Plan of Care (Signed)
Problem: Occupational Therapy - Adult  Goal: By Discharge: Performs self-care activities at highest level of function for planned discharge setting.  See evaluation for individualized goals.  Description: Occupational Therapy Goals   Long Term Goals  Initiated 01/07/2022 and to be accomplished within 2-3 week(s) 01/28/2022  1. Pt will perform self-feeding with [facility]I.  2. Pt will perform grooming with S/Setup  3. Pt will perform UB bathing with S/Setup  4. Pt will perform LB bathing with Mina.  5. Pt will perform tub/shower transfer with [facility]  6. Pt will perform UB dressing with S/Setup  GM 01/15/22  7. Pt will perform LB dressing with Mina.  Continue for consistency  8. Pt will perform toileting task with [facility]. Continue for consistency  9. Pt will perform toilet transfer with [facility]. GM 01/15/22  10.  Pt will perform an IADL task with good safety and Mina.      Short Term Goals   Initiated 01/07/2022 and to be accomplished within 7 day(s) 01/14/2022; update 01/15/22  1. Pt will perform self-feeding with Setup.  GM 01/15/22  2. Pt will perform grooming with Setup.  GM 01/15/22  3. Pt will perform UB bathing with Setup. GM 01/15/22  4. Pt will perform LB bathing with [facility].  GM 01/15/22  5. Pt will perform tub/shower transfer with  [facility]. To be addressed at a later date  6. Pt will perform UB dressing with Mina.  GM 01/15/22  7. Pt will perform LB dressing with [facility]. GM 01/15/22  8. Pt will perform toileting task with [facility]. GM 01/15/22  9. Pt will perform toilet transfer with [facility].GM 01/15/22         Outcome: Progressing   Occupational Therapy TREATMENT    Patient: [name] Short   66 y.o.    Patient identified with name and DOB: Yes     Date: 01/20/2022    First Tx Session  Time In:  0930  Time Out:  1100    Diagnosis: Acute CVA (cerebrovascular accident) [facility]) [I63.9]   Precautions: Restrictions/Precautions: Fall Risk, Surgical Protocols (Cervical Spinal Precautions)  Spinal Precautions: No Bending, No Lifting, No Twisting      Chart,  occupational therapy assessment, plan of care, and goals were reviewed.     Pain:  Pt reports 7/10 pain or discomfort prior to treatment at buttocks.  Intervention Provided: N/A      SUBJECTIVE:   Patient stated "My walker."    OBJECTIVE DATA SUMMARY:     THERAPEUTIC EXERCISE Daily Assessment    Instruct pt. in home exercise program: UB HEP (chess press, chest pulls, butterfly wings, front raise,upright rows, overhead press, tricep extension) with 2lb weigh 3x10.     Theraflex bar ( yellow) supination/pronation) Handout given 3x10.     TOILETING    Functional Level  Pt performed peri hygiene in standing with [facility] for dress/brief management.   Clinical factors       TOILET TRANSFER  Daily Assessment   Transfer score Toilet/bedside commode to w/c with SBA   Comments       WHEELCHAIR/BED TRANSFER INDEPENDENCE Daily Assessment   Transfer Technique    Sit to stand with SBA with asst to manage IV/wound vac cord.  Stand step to w/c with SBA using RW.      ASSESSMENT:  Pt increasing strength and balance for ADLs.   Progression toward goals:  []           Improving appropriately and progressing toward goals  [x]   Improving slowly and progressing toward goals  []           Not making progress toward goals and plan of care will be adjusted     PLAN:  Patient continues to benefit from skilled intervention to address the above impairments.  Continue treatment per established plan of care.  Discharge Recommendations:  Home Health vs SNF  Further Equipment Recommendations for Discharge:  N/A     Activity Tolerance:    Fair    EDUCATION:   Education Given To: Patient  Education Provided: Role of Therapy;Plan of Care;Precautions;Safety;Transfer Training;Energy ;Fall Prevention Strategies  Education Method: Demonstration;Verbal  Barriers to Learning: Cognition  Education Outcome: Verbalized understanding;Demonstrated understanding;Continued education needed    COMMUNICATION:   []  Home safety  education was provided and the patient/caregiver indicated understanding.  [x]  Patient/family have participated as able in goal setting and plan of care.  []  Patient/family agree to work toward stated goals and plan of care.  []  Patient understands intent and goals of therapy, but is neutral about his/her participation.  []  Patient is unable to participate in goal setting and plan of care.    Please refer to the flowsheet for vital signs taken during this treatment.  After treatment:   [x]   Patient left in no apparent distress sitting up in chair   []   Patient left in no apparent distress in bed  []   Call bell left within reach  []   Nursing notified  []   Caregiver present  []   Bed alarm activated    Estimated LOS:2-3 weeks  IRF-PAI Completed: N/A  Entered Differentiated Treatment minutes:     Administrator, sports, OT

## 2022-01-21 ENCOUNTER — Ambulatory Visit (INDEPENDENT_AMBULATORY_CARE_PROVIDER_SITE_OTHER): Payer: Medicare Other | Admitting: Podiatry

## 2022-01-21 DIAGNOSIS — L819 Disorder of pigmentation, unspecified: Secondary | ICD-10-CM

## 2022-01-21 LAB — POCT GLUCOSE
POC Glucose: 316 mg/dL — ABNORMAL HIGH (ref 70–110)
POC Glucose: 90 mg/dL (ref 70–110)
POC Glucose: 189 mg/dL — ABNORMAL HIGH (ref 70–110)
POC Glucose: 210 mg/dL — ABNORMAL HIGH (ref 70–110)

## 2022-01-21 LAB — CREATINE: Creatine, Serum: 1.4 mg/dL — ABNORMAL HIGH (ref 0.1–1.0)

## 2022-01-21 LAB — VANCOMYCIN LEVEL, RANDOM: Vancomycin Rm: 18.2 ug/mL (ref 5.0–40.0)

## 2022-01-21 MED ORDER — AMLODIPINE BESYLATE 5 MG PO TABS
5 MG | Freq: Every day | ORAL | Status: DC
Start: 2022-01-21 — End: 2022-01-25
  Administered 2022-01-22 – 2022-01-24 (×3): 5 mg via ORAL

## 2022-01-21 MED FILL — HUMALOG 100 UNIT/ML IJ SOLN: 100 UNIT/ML | INTRAMUSCULAR | Qty: 4

## 2022-01-21 MED FILL — FUROSEMIDE 40 MG PO TABS: 40 MG | ORAL | Qty: 1

## 2022-01-21 MED FILL — PIPERACILLIN SOD-TAZOBACTAM SO 3.375 (3-0.375) G IV SOLR: 3.375 (3-0.375) g | INTRAVENOUS | Qty: 3375

## 2022-01-21 MED FILL — HYDROCODONE-ACETAMINOPHEN 5-325 MG PO TABS: 5-325 MG | ORAL | Qty: 1

## 2022-01-21 MED FILL — VIAL-MATE ADAPTOR: Qty: 1

## 2022-01-21 MED FILL — LANTUS 100 UNIT/ML SC SOLN: 100 UNIT/ML | SUBCUTANEOUS | Qty: 32

## 2022-01-21 MED FILL — FERROUS SULFATE 325 (65 FE) MG PO TBEC: 325 (65 Fe) MG | ORAL | Qty: 1

## 2022-01-21 MED FILL — NORVASC 5 MG PO TABS: 5 MG | ORAL | Qty: 1

## 2022-01-21 MED FILL — VANCOMYCIN HCL 750 MG IV SOLR: 750 MG | INTRAVENOUS | Qty: 750

## 2022-01-21 MED FILL — THERAPEUTIC-M PO TABS: ORAL | Qty: 1

## 2022-01-21 MED FILL — ASPIRIN LOW DOSE 81 MG PO CHEW: 81 MG | ORAL | Qty: 1

## 2022-01-21 MED FILL — BIO-K PLUS STRONG PO CPDR: ORAL | Qty: 1

## 2022-01-21 MED FILL — ATORVASTATIN CALCIUM 40 MG PO TABS: 40 MG | ORAL | Qty: 2

## 2022-01-21 MED FILL — LOVENOX 40 MG/0.4ML IJ SOSY: 40 MG/0.4ML | INTRAMUSCULAR | Qty: 0.4

## 2022-01-21 NOTE — Plan of Care (Signed)
Problem: Safety - Adult  Goal: Free from fall injury  Outcome: Progressing     Problem: Skin/Tissue Integrity  Goal: Absence of new skin breakdown  Description: 1.  Monitor for areas of redness and/or skin breakdown  2.  Assess vascular access sites hourly  3.  Every 4-6 hours minimum:  Change oxygen saturation probe site  4.  Every 4-6 hours:  If on nasal continuous positive airway pressure, respiratory therapy assess nares and determine need for appliance change or resting period.  Outcome: Progressing     Problem: ABCDS Injury Assessment  Goal: Absence of physical injury  Outcome: Progressing     Problem: Pain  Goal: Verbalizes/displays adequate comfort level or baseline comfort level  Outcome: Progressing

## 2022-01-21 NOTE — Progress Notes (Signed)
[facility]  Team Conference  Date: 01/21/2022  ACTIVE MONITORING OF CO-MORBID CONDITIONS/MANAGEMENT OF NEW MEDICAL ISSUES: Acute CVA (cerebrovascular accident) ([place]) [I63.9]    **Please refer to patient's electronic medical record to view the care plan and goals**  NURSING Making gains Yes   Skin Care: Skin Integumentary   Skin Color: Pink  Skin Condition/Temp: Dry, Warm  Skin Integrity: Wound (see LDA) (sacrum)  Location: Sacrum  Wound Prevention/Protection Method: Yes  Location of Wound Prevention: Buttocks, Heel, Sacrum  Orientation of Wound Prevention: Posterior  Wound Offloading (Prevention Methods): Bed, pressure redistribution/air, Chair cushion, Heel boots, Pillows, Repositioning, Walker, Wheelchair  Dressing Present : Yes  Skin Assessed Underneath Dressing This Shift: Yes  Skin Fold Management: No  Dressing Site: Other (comment) (heel)  Nails: Within Defined Limits    Wound Care:           Pain: Pain Assessment  Pain Assessment: 0-10 (01/21/22 1200)  Pain Level: 5 (01/21/22 1200)  Wong-Baker Pain Rating: No hurt (01/21/22 0400)  Patient's Stated Pain Goal: 0 - No pain (01/21/22 1200)  Pain Location: Buttocks (01/21/22 0925)  Pain Orientation: Mid (01/21/22 0925)  Pain Descriptors: Aching (01/21/22 0925)  Functional Pain Assessment: Activities are not prevented (01/16/22 1200)  Pain Type: Chronic pain (01/18/22 0815)  Pain Frequency: Intermittent (01/21/22 0925)  Pain Onset: On-going (01/18/22 0815)  Non-Pharmaceutical Pain Intervention(s): Repositioned (01/21/22 0800)  Response to Pain Intervention: Patient satisfied (01/21/22 1200)  Side Effects: No reported side effects (01/21/22 1200)    Bladder/Bowel Urine Assessment  Urinary Status: Voiding  Urinary Incontinence: Absent  Urine Color: Yellow/straw  Urine Appearance: Clear  Urine Odor: No odor Stool Assessment  Incontinence: No  Stool Appearance: Hard  Stool Color: Green  Stool Amount: Medium  Stool Source: Rectum  Last BM  (including prior to admit): 01/20/22   []   Continent bladder        [x]  Incontinent bladder           [x] Bladder training      [x]   Continent bowel        []  Incontinent bowel              [] Bowel training    Education:   [x]  Lovenox/Coumadin education    [x]  Diabetic Teaching   []  Tube Feeding Education      [x]  Wound Care education           [x]  Other: medication, stroke prevention, bp mgt, pain mgt   []  Family Teaching completed:  No   []  Lab value concerns   No       Occupational Therapy  Making gains  Yes   Treatment Barriers:  [x]  Fatigue                   [x]  Pain                  [x]  Participation      [x]  Cognition             [x]  Incontinence           []  Precautions      []  Communication/Language   []  Safety awareness  []  Medical Status       []  Visual/Perceptual deficits     []  Other:    Treatment Areas of Focus/Interventions: [x] Coordination     [x] Strength   [x] ADL Training     [x] Transfer Training        [] Cognitive Training      []   Perceptual Training        [] Home Management Training   [x] Improved UE Function    [] Functional Mobility Training    [] Toileting schedule      [] OOB schedule            [] Modalities (E-stim, etc.):   [x]  other: safety    []  Family Teaching completed No      Accessibility Limitations:unknown          Physical Therapy Making gains Yes   Treatment Barriers:  [x]  Fatigue                   [x]  Pain                  []  Participation      [x]  Incontinence          [x]  Precautions      []  Communication/Language  [] Mental Status   [x]  Safety awareness  []  Medical Status       [x]  Other: balance   Treatment Areas of Focus/Interventions: [x] Balance     [x] Strength   [] Coordination     [x] Transfer Training        [x] Mobility     [] Sitting Balance  [x] Gait Training  [x] Improve safety awareness     [x] W/C Mobility         [] Modalities (E-stim, Lite gait etc.):   []  Family Teaching completed No      Accessibility Limitations:steps to enter, able to complete with therapy      Speech  Therapy Making gains Yes   Treatment Barriers:  []  Fatigue                   []  Pain                  []  Participation      [] Impulsivity               []  Aphasia                  [] Attention           [x] Cognition   []  Dysphagia              [x]  Other: memory   Treatment Areas of Focus/Interventions: [] Attention   [] Orientation   [] Executive function   [] Problem solving        [] Language      [] Speech  [x] Memory  [] Swallowing   [] Safety/Awareness     [x]  Other:cognition   []  Family Teaching completed No     Therapy Minutes Density Met: Yes    Therapy Minutes Not met Action/Justification:   []  Pt on medical hold  []  Pt refusing therapy despite encouragement and education on benefits of therapy  []  Pt displays decreased tolerance to therapy  []  Other     CMG Date: 01/30/2022  Estimated Discharge Date: 01/24/2022  [x] Rehabilitation goals from IPOC/Treatment plan reviewed  [x] No changes identified  [] Goal(s) changed:     Plan for upcoming treatment period/intervention for current barriers:  [x]  Pt will increase activity tolerance for daily tasks.  [x]  Pt will improve bed mobility with reduced assist.  [x]  Pt will improve safety in fx tasks with reduced cues/assist  [x]  Pt will improve transfers with reduced assist  []  Pt will improve toileting with reduced assist  [x]  Pt will improve ADL's with use of adaptive equipment with reduced assist  [x]  Pt will improve pain mgmt for maximum participation in tx program  [  x] Pt will improve cognition  []  Pt will improve swallowing for safe diet advancement with use of strategies  []   Plan for discharge to home  Patient needs identified [] Patient/Family Education   [] Family Conference      [] Family Training  Recommended Discharge Plan [] home alone   [] home alone with assist prn    [x] Continuous supervision       [] Return home with s/o/spouse/family   []  Assisted living                     [x] SNF   Home Health pt, ot, slp, nursing and [place]:  Equipment needed at discharge: Rolling walker, Wheelchair, and Wheelchair cushion    Wheelchair: Standard Wheelchair hemi height, 18 inch, Elevating Leg Rest Pair and Wheel lock brake extension handle left, Seat cushion skin protection width less than 22 inches      ADL Equipments:3 in 1 BSC - use of UE for lift off due to pain and function, assistance with hygiene     Plan/Adjustments to Plan   [x] Medical conditions exist that require a minimum of 3 times/week physician      oversight and 24-hour rehabilitation nursing to manage/progress the plan of care   [x] Functional deficits require intensive and coordinated therapies to achieve   goals outlined in plan of care    [x] 3 hours therapy 5 days/week OR 15 hours therapy over 7 days    [x] Continued plan of care as patient is showing progress and /or has an expectation to benefit    Patient's plan of care has been reviewed and/or adjusted. Continue treatment as outlined.   I have led this Team Conference and agree with the plan, Michel Bickers, MD, 01/21/2022, 1:50 PM  Physician signature:      Date/time:    Team Conference Recorder Lolita Cram  Date 01/21/2022    Team members participating in today's conference.   [x]   Rivka Barbara, PT     []                      [x]  Bayard Beaver, OT         [x]  Tempie Hoist, OT       [x]  Elisha Headland, OT          [x]   Carita Pian, SLP    [x]  Lolita Cram, SW        [x]  Eula Listen, RN    [x]  Lelon Frohlich, NP  []  Other

## 2022-01-21 NOTE — Progress Notes (Signed)
Pharmacy Pharmacokinetic Monitoring Service - Vancomycin    Consulting Provider: Jonah Blue, MD   Indication:  infected sacral decub.   Target Concentration: Goal AUC/MIC 400-600 mg*hr/L  Day of Therapy (Rehab): 16 of 31 (through 02/06/22 as per ID recommendations).  Additional Antimicrobials: Piperacillin/Tazobactam    Pertinent Laboratory Values:   Temp: 97.6 F (36.4 C), Weight - Scale: 152 lb 1.6 oz (69 kg)        Labs Renal Latest Ref Rng & Units 01/20/2022 01/18/2022 01/16/2022 01/14/2022 01/10/2022   BUN 7.0 - 18 MG/DL 68(T) 41(D) 62(I) 29(N) 20(H)   Cr 0.6 - 1.3 MG/DL 9.89 2.11 9.41 7.40 8.14   K 3.5 - 5.5 mmol/L 3.5 3.3(L) 3.4(L) 3.5 3.6   Na 136 - 145 mmol/L 144 144 144 143 145        Estimated Creatinine Clearance: 70 mL/min (based on SCr of 0.74 mg/dL).    Pertinent Cultures:  Culture Date Source Results   05/27 Blood x 2 No growth   05/27 Wound, Sacral Proteus mirabilis (sens to Zosyn)  Enterococcus faecalis (sens to Vanc)     MRSA Nasal Swab: N/A. Non-respiratory infection    Assessment:  Date/Time Current Dose Concentration Timing of Concentration (h) AUC   6/05 1000 mg q12h 17.4 9.2h 415   6/06 1000 mg q12h - - -   06/07 " "      06/08 " "      06/09 " " 27.5 6 634   06/10  750 mg q12h - - -   06/11 " " 16.6 7 468   06/12 " " - - -   06/13 " " - - -   06/14 " " 18 7.5 480   06/15 " " - - -   06/16 " " 18.4 7.56 474   06/17 " " - - -   06/18 " " - - -   06/19 " " 18.2 7.5 449   Note: Serum concentrations collected for AUC dosing may appear elevated if collected in close proximity to the dose administered, this is not necessarily an indication of toxicity    Plan:  Continue current dose of 750 mg IV q12h  Renal labs as indicated   No vancomycin concentration at this time.  Pharmacy will continue to monitor patient and adjust therapy as indicated    Thank you for the consult,    Raeanne Gathers, [facility]  01/21/2022

## 2022-01-21 NOTE — Plan of Care (Signed)
Problem: SLP Adult - Disturbed Thought Process  Goal: By Discharge: Demonstrates cognitive skills at highest level of function for planned discharge setting.   See evaluation for individualized goals.  Description: Long term goals (Initiated 01/07/22 to be accomplished by 01/21/2022)  Patient will:  1. Be oriented x 3 and recall events of the day, supervision.  2. Follow 3 part and complex commands, 90% accuracy.  3.  Listen to 3-4 sentence length paragraphs and respond to questions re: content with 90% accuracy.  4.  List 10-12 items in categories of increasing abstraction, supervision.  5.  Discuss similarities/differences given 2 items, 80-90% accuracy.    Short term goals (by 01/21/22)  Patient will:  1.  Be oriented x 3 and recall events of the day, min assist.  2.  Follow 3 part and complex commands, 70-60% accuracy.  3.  Listen to 3-4 sentence length paragraphs and respond to questions re: content with 70-80% accuracy.  4.  List 10-12 items in categories of increasing abstraction, min assist.  5.  Discuss similarities/differences given 2 items, 70-80% accuracy.    Note:   SPEECH-LANGUAGE TREATMENT    Patient: [name] Short (66 y.o. female)  Date: 01/21/2022  Diagnosis: Acute CVA (cerebrovascular accident) [facility]) [I63.9] Acute CVA (cerebrovascular accident) ([facility])      Precautions: Standard  PLOF: As per H&P     ASSESSMENT:  Patient was pleasant and participated in al tasks.   Progression toward goals:  [x]        Improving appropriately and progressing toward goals  []        Improving slowly and progressing toward goals  []        Not making progress toward goals and plan of care will be adjusted     PLAN:  Patient continues to benefit from skilled intervention to address the above impairments.  Continue treatment per established plan of care.     SUBJECTIVE:       Patient stated "I did not rest well".    OBJECTIVE:   Mental Status:  Patient was alert and focused for today's session.   Treatment & Interventions:      Orientation:    Independent  Recent recall:    Independent  Answering questions to paragraphs: 71% accuracy given min assist  Categories:     Things that burn- listed 12 given min assist        Things that are salty- listed 8 independently      Response & Tolerance to Activities:   Patient participates and gives maximal effort to all tasks.     PAIN:  Start of Tx: 6/10 d/t leg spasms   End of Tx: 6/10 d/t leg spasms     After treatment:   [x]        Patient left in no apparent distress sitting up in chair  []        Patient left in no apparent distress in bed  [x]        Call bell left within reach  []        Nursing notified  []        Caregiver present  []        Bed alarm activated      COMMUNICATION/EDUCATION:   []  Patient educated regarding compensatory speech/language/comprehension techniques provided via demonstration, verbalization and teach back of comprehension  [x]  Patient/family have participated as able in goal setting and plan of care.  [x]  Patient/family agree to work toward stated goals and plan of care.  []   Patient understands intent and goals of therapy, neutral about participation.  []  Patient unable to participate in goal setting/plan of care secondary to cognition, hearing/vision deficits; education ongoing with interdisciplinary staff     Time in:  11:00  Time out:  11:30    HUNTER BOWEN  , CCC-SLP

## 2022-01-21 NOTE — Progress Notes (Signed)
[facility]  [address], [place], [place] [address]     INPATIENT REHABILITATION  DAILY PROGRESS NOTE     Date: 01/21/2022    Name: [name] Short Age / Sex: 66 y.o. / female   CSN: [id number] MRN: [id number]   Admit Date: 01/06/2022 Length of Stay: 15 days     Primary Rehabilitation Diagnosis impaired mobility and ADLs in the setting of 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         Subjective:     I personally saw and evaluated this patient face-to-face today.  Patient is sitting in a chair in no apparent distress.  Patient is playing game on her cell phone.  Patient is in good spirits.    Objective:     Vital Signs:  Patient Vitals for the past 24 hrs:   BP Temp Temp src Pulse Resp SpO2   01/21/22 0715 (!) 149/65 97.6 F (36.4 C) Oral 61 16 100 %   01/20/22 2152 -- -- -- -- 18 --   01/20/22 2115 (!) 140/64 97.1 F (36.2 C) Oral 85 18 (!) 89 %   01/20/22 1645 (!) 155/70 97.3 F (36.3 C) Oral 72 16 --        Physical Examination:  General:  Awake, alert  Cardiovascular:  S1S2+, RRR  Pulmonary:  CTA b/l  GI:  Soft, BS+, NT, ND  Extremities:  + edema.  Patient is wearing graded compression stockings.  Patient has a sacral decubitus ulcer with wound VAC in place    Current Medications:  Current Facility-Administered Medications   Medication Dose Route Frequency    [START ON 01/22/2022] amLODIPine (NORVASC) tablet 5 mg  5 mg Oral Daily    lactobacillus delayed release capsule 1 capsule  1 capsule Oral Daily with breakfast    insulin lispro (HUMALOG) injection vial 4 Units  4 Units SubCUTAneous TID WC    hydrALAZINE (APRESOLINE) tablet 10 mg  10 mg Oral Q6H PRN    vancomycin (VANCOCIN) 750 mg in sodium chloride 0.9 % 250 mL IVPB (vial-mate)  750 mg IntraVENous Q12H    ferrous sulfate (FE TABS 325) EC tablet 325 mg  325 mg Oral Daily with breakfast    insulin glargine (LANTUS) injection vial 32 Units  32 Units SubCUTAneous Daily    ondansetron (ZOFRAN-ODT) disintegrating tablet 4  mg  4 mg SubLINGual Q8H PRN    aspirin chewable tablet 81 mg  81 mg Oral Daily    atorvastatin (LIPITOR) tablet 80 mg  80 mg Oral Nightly    enoxaparin (LOVENOX) injection 40 mg  40 mg SubCUTAneous Daily    furosemide (LASIX) tablet 40 mg  40 mg Oral Daily    HYDROcodone-acetaminophen (NORCO) 5-325 MG per tablet 1 tablet  1 tablet Oral Q4H PRN    therapeutic multivitamin-minerals 1 tablet  1 tablet Oral Daily    glucose chewable tablet 16 g  4 tablet Oral PRN    dextrose bolus 10% 125 mL  125 mL IntraVENous PRN    Or    dextrose bolus 10% 250 mL  250 mL IntraVENous PRN    glucagon (rDNA) injection 1 mg  1 mg SubCUTAneous PRN    dextrose 10 % infusion   IntraVENous Continuous PRN    acetaminophen (TYLENOL) tablet 650 mg  650 mg Oral Q4H PRN    polyethylene glycol (GLYCOLAX) packet 17 g  17 g Oral Daily PRN    insulin lispro (HUMALOG) injection vial 0-8  Units  0-8 Units SubCUTAneous TID WC    insulin lispro (HUMALOG) injection vial 0-4 Units  0-4 Units SubCUTAneous Nightly    piperacillin-tazobactam (ZOSYN) 3,375 mg in sodium chloride 0.9 % 50 mL IVPB (mini-bag)  3,375 mg IntraVENous Q8H       Allergies:  Allergies   Allergen Reactions    Lactose Other (See Comments)     Intolerance-Pt denies       Lab/Data Review:  Recent Results (from the past 24 hour(s))   POCT Glucose    Collection Time: 01/20/22  5:07 PM   Result Value Ref Range    POC Glucose 258 (H) 70 - 110 mg/dL   POCT Glucose    Collection Time: 01/20/22  9:24 PM   Result Value Ref Range    POC Glucose 316 (H) 70 - 110 mg/dL   Vancomycin Level, Random    Collection Time: 01/21/22  6:12 AM   Result Value Ref Range    Vancomycin Rm 18.2 5.0 - 40.0 UG/ML   POCT Glucose    Collection Time: 01/21/22  7:15 AM   Result Value Ref Range    POC Glucose 90 70 - 110 mg/dL   POCT Glucose    Collection Time: 01/21/22 11:44 AM   Result Value Ref Range    POC Glucose 189 (H) 70 - 110 mg/dL         Assessment:     Primary Rehabilitation Diagnosis impaired mobility and ADLs  in the setting of 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     Other Co-Morbid Conditions managed in Rehab   Cervical spondylosis with myelopathy, status post laminectomy and fusion of C3-C6 and external bone growth stimulator placement on Dec 09, 2021  Infected sacral decubitus ulcer-status post debridement  Type 2 diabetes with hyperglycemia  Hypertension  Hypomagnesemia  Anemia  Hypokalemia    PLAN  -impaired mobility and ADLs in the setting of 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   Aspirin and statin  PT OT and speech therapy    -Cervical spondylosis with myelopathy, status post laminectomy and fusion of C3-C6 and external bone growth stimulator placement on Dec 09, 2021-PT and OT.  Judicious pain control.  Continue bowel regimen        -Infected sacral decubitus ulcer-status post debridement.  Continue wound care and IV antibiotics.  Routine PICC line care.  Wound care and wound VAC       -Type 2 diabetes with hyperglycemia.  Continue Lantus insulin and prandial insulin and sliding scale insulin.  Monitor sugars  On January 09, 2022 I increased Lantus from 30 units to 32 units daily, monitor.  Continue current regimen  On January 11, 2022 I have increased the prandial insulin from 3 units 3 times daily to 4 units 3 times daily with meals  Some low sugars noted this morning.  Will decrease Lantus back to 30 units subcu daily.    Continue to monitor sugars  Continue Lantus 32 units subcu daily, monitor sugars     -Hypertension-blood pressure is trending up.  Add as needed hydralazine  Blood pressure is trending high.  On January 13, 2022 I have added amlodipine 2.5 mg p.o. daily.    Continue amlodipine 2.5 mg p.o. daily  On January 29, 2022 I have increased amlodipine to 5 mg p.o. daily     -Hypomagnesemia-repleted    -Hypokalemia  On January 07, 2022 potassium was 3.0.  Repleted  On January 08, 2022  potassium is 3.7  On January 09, 2022 potassium was 3.6  On January 10, 2022 potassium is 3.6  On January 14, 2022 potassium is  3.5  On January 16, 2022 potassium was 3.4.    On June 19th 2023 potassium was 3.5     -Anemia-continue ferrous sulfate, monitor  On January 07, 2022 hemoglobin is 8.3  On January 14, 2022 hemoglobin is 8.4  Check CBC in the morning    -Hypercalcemia, primary hyperparathyroidism  On January 07, 2022 calcium was 10.6  On January 08, 2022 calcium is 31  On January 09, 2022 calcium is 10.9  On January 10, 2022 calcium is 10.5  On January 14, 2022 calcium is 10.2  On January 20, 2022 calcium is 10.7  Patient follows with endocrinologist as an outpatient     Fall precautions, aspiration precautions  I discussed with patient.  I discussed with her.  I discussed with the therapist    Functional Progress:    PHYSICAL THERAPY    ON ADMISSION MOST RECENT   Balance  Balance  Posture: Poor  Sitting - Static: Good  Sitting - Dynamic: Fair  Standing - Static: Fair, -  Standing - Dynamic: Poor  Poor  Good  Fair  Fair;-  Poor      Balance  Balance  Posture: Poor  Sitting - Static: Good  Sitting - Dynamic: Fair  Standing - Static: Fair, -  Standing - Dynamic: Poor  Posture: Poor  Sitting - Static: Good  Sitting - Dynamic: Fair  Standing - Static: Fair;-  Standing - Dynamic: Poor        Bed Mobility  Maximum assistance  Maximum assistance  Moderate assistance (Pt requird increased encouragement and time to transition to seated position.)  Moderate Assistance  Maximum assistance  Moderate assistance    Bed Mobility  Rolling to Right: Minimal assistance, Moderate assistance  Rolling to Left: Minimal assistance, Moderate assistance (log roll technique)  Supine to Sit: Minimal assistance (with increased time, maximal verbal reminders for log roll and HOB elevated 20 degrees)  Sit to Stand: Contact guard assistance  Sit to Supine: Minimal assistance, Moderate assistance  Bed to Chair: Contact guard assistance      Wheelchair Mobility  Yes       Wheelchair Mobility  Propulsion: Yes         Ambulation  Rolling Walker  Moderate assistance  Level tile  12  Ambulation  Device: Agricultural consultant  Assistance: Stand by assistance  Surface: Level tile  Distance: 61 Feet   Stairs  No  Left ascending (side stepping with B UE support on hand rail)  Minimal assistance Stairs  Stairs?: Yes  Rails: Left ascending (side stepping with B UE support on hand rail)  Assistance: Minimal assistance     Functional Progress:    OCCUPATIONAL THERAPY    ON ADMISSION MOST RECENT   Eating  Supervision Eating  Feeding: Supervision   Grooming  Supervision, Setup (sitting EOB) Grooming  Grooming: Supervision, Setup   Bathing  UB Bathing Supervision, Setup (sitting EOB)  LB Bathing Maximum assistance Bathing  UB Bathing   UE Bathing: Supervision, Setup  LB Bathing   QPRF([id number])@   Upper Body Dressing  Maximum assistance   Upper Body Dressing  UE Dressing: Supervision, Setup   Lower Body Dressing  Maximum assistance Lower Body Dressing  LE Dressing: Minimal assistance   Toileting  Maximum assistance Toileting  Toileting: Minimal assistance   Toilet Transfers  Moderate assistance  Ecologist: Minimal assistance   Tub Transfers  Tub Transfers: Not tested  [employer] Transfers: Not tested Tub Transfers  Tub Transfers  Tub Transfers: Not tested  [employer] Transfers  Tub Transfers  Tub Transfers: Not tested     Legend:   7 - Independent   6 - Modified Independent   5 - Standby Assistance / Supervision / Set-up   4 - Minimum Assistance / Contact Guard Assistance   3 - Moderate Assistance   2 - Maximum Assistance   1 - Total Assistance / Dependent             Plan:     1. Justification for continued stay: Fair progression towards established rehabilitation goals.      2. Medical Issues being followed closely:    [x]   Fall and safety precautions     [x]   Wound Care     [x]   Bowel and Bladder Function     [x]   Fluid Electrolyte and Nutrition Balance     [x]   Pain Control      3. Issues that 24 hour rehabilitation nursing is following:    [x]   Fall and safety  precautions     [x]   Wound Care     [x]   Bowel and Bladder Function     [x]   Fluid Electrolyte and Nutrition Balance     [x]   Pain Control      [x]   Assistance with and education on in-room safety with transfers to and from the bed, wheelchair, toilet and shower.      4. Acute rehabilitation plan of care:    [x]   Continue current care and rehab.           [x]   Physical Therapy           [x]   Occupational Therapy           [x]   Speech Therapy     []   Hold Rehab until further notice     5. Medications:    [x]   MAR Reviewed     [x]   Continue Present Medications       6. Chemical DVT Prophylaxis:      [x]   Enoxaparin     []   Unfractionated Heparin     []   Warfarin     []   NOAC     []   Aspirin     []   None     7. Mechanical DVT Prophylaxis:      [x]   TED Stockings     []   Sequential Compression Device     []   None     8. GI Prophylaxis:      []   PPI     []   H2 Blocker     [x]   None / Not indicated     9. Code status:Full    Dragon medical dictation software was used for portions of this report. Unintended errors may occur.      Signed:    , MD      January 21, 2022

## 2022-01-21 NOTE — Plan of Care (Signed)
Problem: Physical Therapy - Adult  Goal: By Discharge: Performs mobility at highest level of function for planned discharge setting.  See evaluation for individualized goals.  Description: Physical Therapy Short Term Goals  Initiated 01/07/2022, reassessed 01/14/2022, and to be accomplished within 7 day(s) (01/21/2022)  1.  Patient will supine to sit, sit to supine, roll right, and roll left  in bed with moderate assistance .  MET 01/14/2022   Updated Goal: Patient will supine to sit, sit to supine, roll right, and roll left  in bed with minimal assistance.  2.  Patient will transfer from bed to chair and chair to bed with minimal assistance/contact guard assist using the least restrictive device.  MET 01/14/2022   Updated Goal:  Patient will transfer from bed to chair and chair to bed with supervision using the least restrictive device.  3.  Patient will perform sit to stand with minimal assistance/contact guard assist.  MET 01/14/2022   Updated Goal:  Patient will perform sit to stand with supervision.  4.  Patient will ambulate with minimal assistance/contact guard assist for 50 feet with the least restrictive device.  MET 01/14/2022   Updated Goal:  Patient will ambulate with supervision for 150 feet with the least restrictive device.   5.  Patient will ascend/descend 3 stairs with B handrail(s) with minimal assistance/contact guard assist.  Not Met - Ongoing    Physical Therapy Long Term Goals  Initiated 01/07/2022 and to be accomplished within 14-21 day(s) (01/28/2022)  1.  Patient will supine to sit, sit to supine, roll right, and roll left in bed with supervision/set-up.    2.  Patient will transfer from bed to chair and chair to bed with supervision/set-up using the least restrictive device.  3.  Patient will perform sit to stand with supervision/set-up.  4.  Patient will ambulate with supervision/set-up for 150 feet with the least restrictive device.   5.  Patient will ascend/descend 4 stairs with 1 handrail(s)  with supervision/set-up      Outcome: Progressing      PHYSICAL THERAPY TREATMENT    Patient: [name] Short (66 y.o. female)  Date: 01/21/2022  Diagnosis: Acute CVA (cerebrovascular accident) [facility]) [I63.9]   Precautions: Fall Risk Precautions, Cervical Spinal Precautions  Chart, physical therapy assessment, plan of care and goals were reviewed.    Time in: 0945  Time out : 1100    Patient seen for: Hotel manager, Personnel officer, Training and development officer, Patient Education    Time in: 1515  Time out : 1535    Patient seen for: Hotel manager, Personnel officer, Training and development officer, Patient Education    Pain:  Pt pain was reported as 7/10 pre-treatment.  Pt pain was reported as 7/10 post-treatment.  Intervention: Pt's nursing staff notified and aware of pt's c/o sacral pain and right LE muscle spasms.    Patient identified with name and DOB: Yes    SUBJECTIVE:      Pt reports she has right LE spasms with ambulation limiting pt's ambulation tolerance this treatment session.    OBJECTIVE DATA SUMMARY:    Objective:   Education: Education Given To: Patient  Education Provided: Role of Therapy;Plan of Care;Precautions;Safety;Transfer Training;Energy Occupational hygienist;Fall Prevention Strategies  Education Method: Demonstration;Verbal  Barriers to Learning: Cognition  Education Outcome: Verbalized understanding;Demonstrated understanding;Continued education needed    TRANSFERS Daily Assessment    Sit to Stand Contact guard assistance    Transfer Assist Score Contact guard assistance stand step with RW  Comments Pt requires CGA for safety and balance with functional transfers with moderate verbal cuing for quality and anterior weight shift with sit to stand for decreased compensatory UE weight bearing.        GAIT Daily Assessment    Gait Deviations Decreased step height;Decreased step length    Assistive device Rolling Walker    Ambulation assistance - surface  Stand by assistance  Level tile    Distance 61 Feet     Comments Pt ambulates with decreased B foot clearance with increased lateral weight shifts requiring moderate verbal cues for increased hip and knee flexion in swing phase.  Pt demonstrates decreased right stance time and weight shift with decreased left step length.    During second treatment session, pt ambulated with RW in hallway for 10 minutes requiring moderate verbal cues for forward gaze, upright posture and maximal verbal cues for hip and knee flexion throughout gait trial.        BALANCE Daily Assessment    Posture Poor    Sitting - Static Good    Sitting - Dynamic Fair    Standing - Static Fair;-    Standing - Dynamic Poor      WHEELCHAIR MOBILITY/MANAGEMENT Daily Assessment   Able to Propel  200 Feet   Assist Level  Supervision   Curbs/ramps assistance required  NT   Wheelchair management  Manages B Brakes, left brake with brake extender   Comments Pt requires maximal encouragement for efficient propulsion with B UE with maximal cues for increased ROM and push through left UE.     THERAPEUTIC EXERCISES Daily Assessment     Seated LE Strength Training:  3 Sets of 10 Repetitions:  AROM Right and Left LAQ  AROM Alternate Hip Flexion        ASSESSMENT:  Pt is progressing well with functional mobility but was limited in tolerance for ambulation this treatment session  2/2 reported muscle spasms.  Progression toward goals:  []       Improving appropriately and progressing toward goals  [x]       Improving slowly and progressing toward goals  []       Not making progress toward goals and plan of care will be adjusted      PLAN:  Patient continues to benefit from skilled intervention to address the above impairments.  Continue treatment per established plan of care.  Discharge Recommendations:  Home with Home health PT;24 hour supervision or assist;Subacute/Skilled Nursing Facility  Further Equipment Recommendations for Discharge:        Rolling Walker             Estimated Discharge Date: 01/24/2022    Activity  Tolerance:   Fair  Please refer to the flowsheet for vital signs taken during this treatment.    After treatment:   []  Patient left in no apparent distress in bed  [x]  Patient left in no apparent distress sitting up in chair  []  Patient left in no apparent distress in w/c mobilizing under own power  []  Patient left in no apparent distress dining area  []  Patient left in no apparent distress mobilizing under own power  [x]  Call bell left within reach  [x]  Nursing notified  []  Caregiver present  []  Bed alarm activated   [x]  Chair alarm activated      Rivka Barbara, PT, DPT  01/21/2022

## 2022-01-21 NOTE — Plan of Care (Signed)
Problem: Safety - Adult  Goal: Free from fall injury  Outcome: Progressing  Flowsheets (Taken 01/21/2022 0009)  Free From Fall Injury: Instruct family/caregiver on patient safety     Problem: Skin/Tissue Integrity  Goal: Absence of new skin breakdown  Description: 1.  Monitor for areas of redness and/or skin breakdown  2.  Assess vascular access sites hourly  3.  Every 4-6 hours minimum:  Change oxygen saturation probe site  4.  Every 4-6 hours:  If on nasal continuous positive airway pressure, respiratory therapy assess nares and determine need for appliance change or resting period.  Outcome: Progressing     Problem: ABCDS Injury Assessment  Goal: Absence of physical injury  Outcome: Progressing  Flowsheets (Taken 01/21/2022 0009)  Absence of Physical Injury: Implement safety measures based on patient assessment     Problem: Chronic Conditions and Co-morbidities  Goal: Patient's chronic conditions and co-morbidity symptoms are monitored and maintained or improved  Outcome: Progressing  Flowsheets (Taken 01/20/2022 2000)  Care Plan - Patient's Chronic Conditions and Co-Morbidity Symptoms are Monitored and Maintained or Improved: Monitor and assess patient's chronic conditions and comorbid symptoms for stability, deterioration, or improvement     Problem: Pain  Goal: Verbalizes/displays adequate comfort level or baseline comfort level  Outcome: Progressing  Flowsheets (Taken 01/20/2022 2100)  Verbalizes/displays adequate comfort level or baseline comfort level:   Encourage patient to monitor pain and request assistance   Assess pain using appropriate pain scale   Administer analgesics based on type and severity of pain and evaluate response   Implement non-pharmacological measures as appropriate and evaluate response     Problem: Nutrition Deficit:  Goal: Optimize nutritional status  Outcome: Progressing

## 2022-01-21 NOTE — Plan of Care (Signed)
Problem: Occupational Therapy - Adult  Goal: By Discharge: Performs self-care activities at highest level of function for planned discharge setting.  See evaluation for individualized goals.  Description: Occupational Therapy Goals   Long Term Goals  Initiated 01/07/2022 and to be accomplished within 2-3 week(s) 01/28/2022; updated 01/21/22  1. Pt will perform self-feeding with ModI.  2. Pt will perform grooming with S/Setup GM 01/21/22  3. Pt will perform UB bathing with S/Setup  GM 01/21/22  4. Pt will perform LB bathing with Mina.  GM 01/21/22  5. Pt will perform tub/shower transfer with [facility]. (to be addressed at later date)  6. Pt will perform UB dressing with S/Setup  GM 01/15/22    7. Pt will perform LB dressing with Mina.  Continue for consistency  GM 01/21/22  8. Pt will perform toileting task with [facility]. Continue for consistency   GM 01/21/22  9. Pt will perform toilet transfer with [facility]. GM 01/15/22  10.  Pt will perform an IADL task with good safety and Mina.      Short Term Goals   Initiated 01/07/2022 and to be accomplished within 7 day(s) 01/14/2022; update 01/15/22; updated 01/21/22  1. Pt will perform self-feeding with Setup.  GM 01/15/22  2. Pt will perform grooming with Setup.  GM 01/15/22  3. Pt will perform UB bathing with Setup. GM 01/15/22  4. Pt will perform LB bathing with Moda.  GM 01/15/22  5. Pt will perform tub/shower transfer with  [facility]. To be addressed at a later date  76. Pt will perform UB dressing with Mina.  GM 01/15/22  7. Pt will perform LB dressing with Moda. GM 01/15/22  8. Pt will perform toileting task with [facility]. GM 01/15/22  9. Pt will perform toilet transfer with Moda.GM 01/15/22         Outcome: Progressing   OCCUPATIONAL THERAPY DISCHARGE    Patient: [name] Short (66 y.o. female)  Date: 01/21/2022    First Tx Session  Time In: 0730  Time Out: 0913  Pt pending authorization for Discharge.       Primary Diagnosis: Acute CVA (cerebrovascular accident) ([place]) [I63.9] Acute CVA (cerebrovascular accident)  [facility])    Precautions: Fall Risk, Surgical Protocols (Cervical Spinal Precautions),  ,  ,  ,  , Spinal Precautions: No Bending, No Lifting, No Twisting,  ,        Barriers to Learning/Limitations: yes;  cognitive  Compensate with: visual, verbal, tactile, kinesthetic cues/model     Patient identified with name and DOB:Yes    SUBJECTIVE:   Patient instructed purpose of session was to work on time management.  Pt showed no adherence to the focus off session deciding to have crackers and peanut butter x3 during session.     OBJECTIVE DATA SUMMARY:     Past Medical History:   Diagnosis Date    Anemia     Arthritis     Chronic pain     Legs and Shoulders    Diabetes ([place])     Exposure to asbestos     History of blood transfusion     History of colon polyps     HTN (hypertension)     Hx of blood clots     DVT    Hyperlipemia     Menopause     Pulmonary nodule     Serum calcium elevated     Sleep apnea     not using cpap    Stroke ([place])  Past Surgical History:   Procedure Laterality Date    CESAREAN SECTION  1987    COLONOSCOPY N/A 01/13/2017    COLONOSCOPY performed by Joycelyn Schmid, MD at [place] N/A 12/28/2021    SACRAL WOUND DEBRIDEMENT INCISION AND DRAINAGE performed by Nestor Ramp, DO at [place] N/A 12/09/2021    CERVICAL THREE/FOUR/FIVE/SIX LAMINECTOMY FUSION; C-ARM; STRYKER; EXT BONE GROWTH STIM; 23 HR performed by Candi Leash, MD at [place]     Prior Level of Function: ModI using RW  Home Situation: House One level Stairs to enter with rails (3 STE with wide set rails + 1 threshold step)     _0      Right hand dominant   _1      Left hand dominant     Pain:  Pt reports 7/10 pain or discomfort prior to treatment.    Pt reports 7/10 pain or discomfort post treatment.   Problem List:    Decreased strength B UE  _2      Decreased strength trunk/core  _3      Decreased AROM   _4      Decreased PROM  _5       Decreased balance sitting  _6      Decreased balance standing  _7      Decreased endurance  _8      Pain  _9        Functional Limitations:   Decreased independence with ADL  _10      Decreased independence with functional transfers  _11      Decreased independence with ambulation  _12      Decreased independence with IADL  _13        Outcome Measures:      MMT Initial Assessment    Right Upper Extremity  Left Upper Extremity    UE AROM ROM: up to 90 degrees 2/2 to spinal precautions; MMT :3/5 proximal and 4/5 distal ROM: up to 90 degrees 2/2 to spinal precautions; MMT :3/5 proximal and 4/5 distal    Grip [facility]       MMT Discharge Assessment   Right Upper Extremity  Left Upper Extremity    UE AROM WFL 2/2 spinal precautions WFL 2/2 to spinal precautions   Grip [facility] WFL       0/5 No palpable muscle contraction  1/5 Palpable muscle contraction, no joint movement  2-/5 Less than full range of motion in gravity eliminated position  2/5 Able to complete full range of motion in gravity eliminated position  2+/5 Able to initiate movement against gravity  3-/5 More than half but not full range of motion against gravity  3/5 Able to complete full range of motion against gravity  3+/5 Completes full range of motion against gravity with minimal resistance  4-/5 Completes full range of motion against gravity with minimal resistance  4/5 Completes full range of motion against gravity with moderate resistance  5/5 Completes full range of motion against gravity with maximum resistance    Coordination: WFL  Sensation: Impaired    COGNITION/PERCEPTION Initial Assessment Discharge Assessment 01/21/2022   Overall orientation status Within Functional Limits  WFL   Orientation level Oriented X4     Overall cognitive status WFL     Cognition comment       Vision - Basic assessment       Perception  Overall sensation status Impaired       EATING Initial Assessment Discharge Assessment 01/21/2022   Functional Level Supervision   Supervision   Clinical factors Pt performed self-feeding (set-up/ supv) in seated position at edge of bed for lunch tray; pt demonstrating ability to open lid and containers on tray with increased time.       GROOMING Initial Assessment Discharge Assessment 01/21/2022   Functional Level Supervision, Setup (sitting EOB) Grooming: Supervision;Setup   Clinical factors  Pt performed oral care at EOB with supervision and setup.       UPPER BODY BATHING Initial Assessment Discharge Assessment 01/21/2022   Functional Level    Supervision, Setup (sitting EOB) UE Bathing: Supervision;Setup   Clinical Factors Pt performed UB bathing via sponge bath at EOB with supervision with exception of washing sticky reside from leads of skin.       LOWER BODY BATHING Initial Assessment Discharge Progress Assessment 01/21/2022   Functional Level    Maximum assistance LE Bathing: Minimal assistance   Clinical Factors   Pt requires an increased amount of time to complete 2/2 to stress incontinence of bladder requiring multiple changes. Pt able to perform peri wash but not buttock wash 2/2 to wound vac.  Ptcomplete washing BLE seated on bed using LHS       TUB/SHOWER TRANSFER INDEPENDENCE Initial Assessment Discharge Assessment 01/21/2022   Tub/Shower Transfer Score Not tested   Not Tested 2/2 to wound vac-To be addressed at later date   Tub Transfer Comments       Shower Transfer Score: Not tested  Not Tested to wound vac-To be addressed at later date   Shower Transfer Comments         UPPER BODY DRESSING/UNDRESSING Initial Assessment Discharge Assessment 01/21/2022   Functional Level Maximum assistance UE Dressing: Supervision;Setup   Clinical Factors Pt performed UB dressing at EOB with supervision and setup and min VC's for sequencing and problem solving.       LOWER BODY DRESSING/UNDRESSING Initial Assessment Discharge Assessment 01/21/2022   Functional Level Maximum assistance LE Dressing: Minimal assistance   Clinical Factors Pt performed  LB dressing at EOB with sock aid to don BLE socks and supervision. Pt thread BLE feet through brief and pants using long handled reacher and pulled up over buttocks and BLE hips with modA from bed level.       TOILETING Initial Assessment Discharge Assessment 01/21/2022   Functional Level Maximum assistance Toileting: Minimal assistance   Clinical factors Pt declined.       TOILET TRANSFER INDEPENDENCE Initial Assessment Discharge Assessment 01/21/2022   Transfer score Moderate assistance Toilet Transfer: Minimal assistance   Toilet transfer comments           WHEELCHAIR/BED TRANSFER INDEPENDENCE Initial Assessment Discharge Assessment 01/21/2022   Transfer Technique   Stand step     Level of Assistance Moderate assistance     Equipment       Comments           Please see [facility] Interdisciplinary Eval: Coordination/Balance Section for details regarding FIM score description.    Activity Tolerance:   Fair-    ASSESSMENT:  Pt has met most STG/LTG requiring S/SBA to [facility] with ADLs and a substantial amount of time to complete ADLs.   Progression toward goals:  _0       Improving appropriately and progressing toward goals  _1       Improving slowly and progressing toward goals  _2   Not making progress toward goals and plan of care will be adjusted     PLAN:  Pt would benefit from continued skilled occupational therapy in order to improve ADL function and IADL with use of least restrictive device. Interventions may include range of motion (AROM, PROM B UE/trunk), motor function (B UE/trunk strengthening/coordination), activity tolerance (vitals, oxygen saturation levels), ADL, balance activities, IADL, and functional transfer training.     Discharge Recommendations:   SNF VS HH w/24 hr asst  Further Equipment Recommendations for Discharge:     Bedside Commode  IRF PAI completed: N/A  Entered Differentiated Treatment minutes: N/A       Please refer to the flow sheet for vital signs taken during this treatment.  After  treatment:   _0   Patient left in no apparent distress sitting up in chair  _1   Patient left in no apparent distress in bed  _2   Call bell left within reach  _3   Nursing notified  _4   Caregiver present  _5   Bed alarm activated    COMMUNICATION/EDUCATION:   _6  Home safety education was provided and the patient/caregiver indicated understanding.  _7  Patient/family have participated as able in goal setting and plan of care.  _8  Patient/family agree to work toward stated goals and plan of care.  _9  Patient understands intent and goals of therapy, but is neutral about his/her participation.  _10  Patient is unable to participate in goal setting and plan of care.    Estimated LOS:2-3 weeks  IRF-PAI Completed: N/A  Entered Differentiated Treatment minutes: N/A    Arlean Hopping, OT  01/21/2022

## 2022-01-21 NOTE — Progress Notes (Signed)
  Subjective:  Patient ID: Julie Elliott, female    DOB: January 11, 1956,  MRN: [id number]  Chief Complaint  Patient presents with   Routine Post Op      POV #1 DOS 01/14/2022 BIOPSY OF SKIN LESION RT     66 y.o. female returns for post-op check.  Here today for follow-up of biopsy  Review of Systems: Negative except as noted in the HPI. Denies N/V/F/Ch.   Objective:  There were no vitals filed for this visit. There is no height or weight on file to calculate BMI. Constitutional Well developed. Well nourished.  Vascular Foot warm and well perfused. Capillary refill normal to all digits.  Calf is soft and supple, no posterior calf or knee pain, negative Homans' sign  Neurologic Normal speech. Oriented to person, place, and time. Epicritic sensation to light touch grossly present bilaterally.  Dermatologic Skin healing well without signs of infection.  No signs of delayed hearing currently  Orthopedic: Tenderness to palpation noted about the surgical site.    1. Pigmented skin lesion of uncertain nature    Plan:  Patient was evaluated and treated and all questions answered.  Right fifth toe biopsy site is healing well.  There is no signs of infection.  I do not have her biopsy results currently I will message her with the results and we will discuss further treatment remotely.  Return to see me as needed in office  Return if symptoms worsen or fail to improve.

## 2022-01-22 LAB — BASIC METABOLIC PANEL
Anion Gap: 4 mmol/L (ref 3.0–18)
BUN/Creatinine Ratio: 32 — ABNORMAL HIGH (ref 12–20)
BUN: 25 mg/dL — ABNORMAL HIGH (ref 7.0–18)
CO2: 30 mmol/L (ref 21–32)
Calcium: 11.1 mg/dL — ABNORMAL HIGH (ref 8.5–10.1)
Chloride: 109 mmol/L (ref 100–111)
Creatinine: 0.79 mg/dL (ref 0.6–1.3)
Est, Glom Filt Rate: >60 mL/min/{1.73_m2} (ref >60)
Glucose: 139 mg/dL — ABNORMAL HIGH (ref 74–99)
Potassium: 3.4 mmol/L — ABNORMAL LOW (ref 3.5–5.5)
Sodium: 143 mmol/L (ref 136–145)

## 2022-01-22 LAB — CBC WITH AUTO DIFFERENTIAL
Basophils %: 1 % (ref 0–2)
Basophils Absolute: 0 10*3/uL (ref 0.0–0.1)
Eosinophils %: 9 % — ABNORMAL HIGH (ref 0–5)
Eosinophils Absolute: 0.4 10*3/uL (ref 0.0–0.4)
Hematocrit: 31.7 % — ABNORMAL LOW (ref 35.0–45.0)
Hemoglobin: 9.5 g/dL — ABNORMAL LOW (ref 12.0–16.0)
Immature Granulocytes %: 0 % (ref 0.0–0.5)
Immature Granulocytes Absolute: 0 10*3/uL (ref 0.00–0.04)
Lymphocytes %: 14 % — ABNORMAL LOW (ref 21–52)
Lymphocytes Absolute: 0.6 10*3/uL — ABNORMAL LOW (ref 0.9–3.6)
MCH: 24.5 pg (ref 24.0–34.0)
MCHC: 30 g/dL — ABNORMAL LOW (ref 31.0–37.0)
MCV: 81.9 FL (ref 78.0–100.0)
MPV: 10 FL (ref 9.2–11.8)
Monocytes %: 13 % — ABNORMAL HIGH (ref 3–10)
Monocytes Absolute: 0.6 10*3/uL (ref 0.05–1.2)
Neutrophils %: 63 % (ref 40–73)
Neutrophils Absolute: 2.6 10*3/uL (ref 1.8–8.0)
Nucleated RBCs: 0 /100{WBCs}
Platelets: 279 10*3/uL (ref 135–420)
RBC: 3.87 M/uL — ABNORMAL LOW (ref 4.20–5.30)
RDW: 16.9 % — ABNORMAL HIGH (ref 11.6–14.5)
WBC: 4.2 10*3/uL — ABNORMAL LOW (ref 4.6–13.2)
nRBC: 0 10*3/uL (ref 0.00–0.01)

## 2022-01-22 LAB — POCT GLUCOSE
POC Glucose: 152 mg/dL — ABNORMAL HIGH (ref 70–110)
POC Glucose: 132 mg/dL — ABNORMAL HIGH (ref 70–110)
POC Glucose: 157 mg/dL — ABNORMAL HIGH (ref 70–110)
POC Glucose: 163 mg/dL — ABNORMAL HIGH (ref 70–110)

## 2022-01-22 MED ORDER — POTASSIUM BICARB-CITRIC ACID 10 MEQ PO TBEF
10 MEQ | Freq: Once | ORAL | Status: AC
Start: 2022-01-22 — End: 2022-01-22
  Administered 2022-01-22: 21:00:00 40 meq via ORAL

## 2022-01-22 MED FILL — HYDROCODONE-ACETAMINOPHEN 5-325 MG PO TABS: 5-325 MG | ORAL | Qty: 1

## 2022-01-22 MED FILL — ASPIRIN LOW DOSE 81 MG PO CHEW: 81 MG | ORAL | Qty: 1

## 2022-01-22 MED FILL — VANCOMYCIN HCL 750 MG IV SOLR: 750 MG | INTRAVENOUS | Qty: 750

## 2022-01-22 MED FILL — HUMALOG 100 UNIT/ML IJ SOLN: 100 UNIT/ML | INTRAMUSCULAR | Qty: 4

## 2022-01-22 MED FILL — LANTUS 100 UNIT/ML SC SOLN: 100 UNIT/ML | SUBCUTANEOUS | Qty: 32

## 2022-01-22 MED FILL — PIPERACILLIN SOD-TAZOBACTAM SO 3.375 (3-0.375) G IV SOLR: 3.375 (3-0.375) g | INTRAVENOUS | Qty: 3375

## 2022-01-22 MED FILL — FERROUS SULFATE 325 (65 FE) MG PO TBEC: 325 (65 Fe) MG | ORAL | Qty: 1

## 2022-01-22 MED FILL — THERAPEUTIC-M PO TABS: ORAL | Qty: 1

## 2022-01-22 MED FILL — NORVASC 5 MG PO TABS: 5 MG | ORAL | Qty: 1

## 2022-01-22 MED FILL — EFFER-K 10 MEQ PO TBEF: 10 MEQ | ORAL | Qty: 4

## 2022-01-22 MED FILL — ATORVASTATIN CALCIUM 40 MG PO TABS: 40 MG | ORAL | Qty: 2

## 2022-01-22 MED FILL — BIO-K PLUS STRONG PO CPDR: ORAL | Qty: 1

## 2022-01-22 MED FILL — LOVENOX 40 MG/0.4ML IJ SOSY: 40 MG/0.4ML | INTRAMUSCULAR | Qty: 0.4

## 2022-01-22 MED FILL — FUROSEMIDE 40 MG PO TABS: 40 MG | ORAL | Qty: 1

## 2022-01-22 NOTE — Plan of Care (Signed)
Problem: Chronic Conditions and Co-morbidities  Goal: Patient's chronic conditions and co-morbidity symptoms are monitored and maintained or improved  Outcome: Progressing     Problem: Safety - Adult  Goal: Free from fall injury  Outcome: Progressing     Problem: Skin/Tissue Integrity  Goal: Absence of new skin breakdown  Description: 1.  Monitor for areas of redness and/or skin breakdown  2.  Assess vascular access sites hourly  3.  Every 4-6 hours minimum:  Change oxygen saturation probe site  4.  Every 4-6 hours:  If on nasal continuous positive airway pressure, respiratory therapy assess nares and determine need for appliance change or resting period.  Outcome: Progressing     Problem: ABCDS Injury Assessment  Goal: Absence of physical injury  Outcome: Progressing     Problem: Pain  Goal: Verbalizes/displays adequate comfort level or baseline comfort level  Outcome: Progressing

## 2022-01-22 NOTE — Progress Notes (Signed)
[facility]  [address], [place], [place] [address]     INPATIENT REHABILITATION  DAILY PROGRESS NOTE     Date: 01/22/2022    Name: [name] Short Age / Sex: 66 y.o. 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  Patient is sitting in bed in no apparent distress, awake and follows commands    Objective:     Vital Signs:  Patient Vitals for the past 24 hrs:   BP Temp Temp src Pulse Resp SpO2   01/22/22 1559 135/63 97.2 F (36.2 C) Oral 70 18 100 %   01/22/22 0846 139/74 -- -- -- -- --   01/22/22 0732 139/74 97 F (36.1 C) Oral 63 18 100 %   01/21/22 1945 (!) 145/68 97.2 F (36.2 C) Oral 72 16 99 %        Physical Examination:  General:  Awake, alert  Cardiovascular:  S1S2+, RRR  Pulmonary:  CTA b/l  GI:  Soft, BS+, NT, ND  Extremities:  + edema  Patient has sacral decubitus ulcer with wound VAC in place    Current Medications:  Current Facility-Administered Medications   Medication Dose Route Frequency    potassium bicarb-citric acid (EFFER-K) effervescent tablet 40 mEq  40 mEq Oral Once    amLODIPine (NORVASC) tablet 5 mg  5 mg Oral Daily    lactobacillus delayed release capsule 1 capsule  1 capsule Oral Daily with breakfast    insulin lispro (HUMALOG) injection vial 4 Units  4 Units SubCUTAneous TID WC    hydrALAZINE (APRESOLINE) tablet 10 mg  10 mg Oral Q6H PRN    vancomycin (VANCOCIN) 750 mg in sodium chloride 0.9 % 250 mL IVPB (vial-mate)  750 mg IntraVENous Q12H    ferrous sulfate (FE TABS 325) EC tablet 325 mg  325 mg Oral Daily with breakfast    insulin glargine (LANTUS) injection vial 32 Units  32 Units SubCUTAneous Daily    ondansetron (ZOFRAN-ODT) disintegrating tablet 4 mg  4 mg SubLINGual Q8H PRN    aspirin chewable tablet 81  mg  81 mg Oral Daily    atorvastatin (LIPITOR) tablet 80 mg  80 mg Oral Nightly    enoxaparin (LOVENOX) injection 40 mg  40 mg SubCUTAneous Daily    furosemide (LASIX) tablet 40 mg  40 mg Oral Daily    HYDROcodone-acetaminophen (NORCO) 5-325 MG per tablet 1 tablet  1 tablet Oral Q4H PRN    therapeutic multivitamin-minerals 1 tablet  1 tablet Oral Daily    glucose chewable tablet 16 g  4 tablet Oral PRN    dextrose bolus 10% 125 mL  125 mL IntraVENous PRN    Or    dextrose bolus 10% 250 mL  250 mL IntraVENous PRN    glucagon (rDNA) injection 1 mg  1 mg SubCUTAneous PRN    dextrose 10 % infusion   IntraVENous Continuous PRN    acetaminophen (TYLENOL) tablet 650 mg  650 mg Oral Q4H PRN    polyethylene glycol (GLYCOLAX) packet 17 g  17 g Oral Daily PRN    insulin lispro (HUMALOG) injection vial 0-8 Units  0-8 Units SubCUTAneous TID WC    insulin  lispro (HUMALOG) injection vial 0-4 Units  0-4 Units SubCUTAneous Nightly    piperacillin-tazobactam (ZOSYN) 3,375 mg in sodium chloride 0.9 % 50 mL IVPB (mini-bag)  3,375 mg IntraVENous Q8H       Allergies:  Allergies   Allergen Reactions    Lactose Other (See Comments)     Intolerance-Pt denies       Lab/Data Review:  Recent Results (from the past 24 hour(s))   POCT Glucose    Collection Time: 01/21/22 10:02 PM   Result Value Ref Range    POC Glucose 152 (H) 70 - 110 mg/dL   Basic Metabolic Panel    Collection Time: 01/22/22  7:18 AM   Result Value Ref Range    Sodium 143 136 - 145 mmol/L    Potassium 3.4 (L) 3.5 - 5.5 mmol/L    Chloride 109 100 - 111 mmol/L    CO2 30 21 - 32 mmol/L    Anion Gap 4 3.0 - 18 mmol/L    Glucose 139 (H) 74 - 99 mg/dL    BUN 25 (H) 7.0 - 18 MG/DL    Creatinine 5.400.79 0.6 - 1.3 MG/DL    Bun/Cre Ratio 32 (H) 12 - 20      Est, Glom Filt Rate >60 >60 ml/min/1.8573m2    Calcium 11.1 (H) 8.5 - 10.1 MG/DL   CBC with Auto Differential    Collection Time: 01/22/22  7:18 AM   Result Value Ref Range    WBC 4.2 (L) 4.6 - 13.2 K/uL    RBC 3.87 (L) 4.20 - 5.30 M/uL     Hemoglobin 9.5 (L) 12.0 - 16.0 g/dL    Hematocrit 98.131.7 (L) 35.0 - 45.0 %    MCV 81.9 78.0 - 100.0 FL    MCH 24.5 24.0 - 34.0 PG    MCHC 30.0 (L) 31.0 - 37.0 g/dL    RDW 19.116.9 (H) 47.811.6 - 14.5 %    Platelets 279 135 - 420 K/uL    MPV 10.0 9.2 - 11.8 FL    Nucleated RBCs 0.0 0 PER 100 WBC    nRBC 0.00 0.00 - 0.01 K/uL    Neutrophils % 63 40 - 73 %    Lymphocytes % 14 (L) 21 - 52 %    Monocytes % 13 (H) 3 - 10 %    Eosinophils % 9 (H) 0 - 5 %    Basophils % 1 0 - 2 %    Immature Granulocytes 0 0.0 - 0.5 %    Neutrophils Absolute 2.6 1.8 - 8.0 K/UL    Lymphocytes Absolute 0.6 (L) 0.9 - 3.6 K/UL    Monocytes Absolute 0.6 0.05 - 1.2 K/UL    Eosinophils Absolute 0.4 0.0 - 0.4 K/UL    Basophils Absolute 0.0 0.0 - 0.1 K/UL    Absolute Immature Granulocyte 0.0 0.00 - 0.04 K/UL    Differential Type AUTOMATED     POCT Glucose    Collection Time: 01/22/22  7:35 AM   Result Value Ref Range    POC Glucose 132 (H) 70 - 110 mg/dL   POCT Glucose    Collection Time: 01/22/22 11:43 AM   Result Value Ref Range    POC Glucose 157 (H) 70 - 110 mg/dL   POCT Glucose    Collection Time: 01/22/22  3:57 PM   Result Value Ref Range    POC Glucose 163 (H) 70 - 110 mg/dL         Assessment:  Primary Rehabilitation Diagnosis impaired mobility and ADLs in the setting of 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     Other Co-Morbid Conditions managed in Rehab   Cervical spondylosis with myelopathy, status post laminectomy and fusion of C3-C6 and external bone growth stimulator placement on Dec 09, 2021  Infected sacral decubitus ulcer-status post debridement  Type 2 diabetes with hyperglycemia  Hypertension  Hypomagnesemia  Anemia  Hypokalemia    PLAN  -impaired mobility and ADLs in the setting of 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   Aspirin and statin  PT OT and speech therapy    -Cervical spondylosis with myelopathy, status post laminectomy and fusion of C3-C6 and external bone growth stimulator placement on Dec 09, 2021-PT  and OT.  Judicious pain control, continue bowel regimen        -Infected sacral decubitus ulcer-status post debridement.  Continue wound care and IV antibiotics.  Routine PICC line care.  Wound care and wound VAC       -Type 2 diabetes with hyperglycemia.  Continue Lantus insulin and prandial insulin and sliding scale insulin.  Monitor sugars  On January 09, 2022 I increased Lantus from 30 units to 32 units daily, monitor.  Continue current regimen  On January 11, 2022 I have increased the prandial insulin from 3 units 3 times daily to 4 units 3 times daily with meals  Some low sugars noted this morning.  Will decrease Lantus back to 30 units subcu daily.    Continue to monitor sugars  Continue Lantus 32 units subcu daily, monitor sugars     -Hypertension-blood pressure is trending up.  Add as needed hydralazine  Blood pressure is trending high.  On January 13, 2022 I have added amlodipine 2.5 mg p.o. daily.    Continue amlodipine 2.5 mg p.o. daily  On January 21, 2022 I have increased amlodipine to 5 mg p.o. daily  Continue to monitor blood pressure     -Hypomagnesemia-repleted    -Hypokalemia  On January 07, 2022 potassium was 3.0.  Repleted  On January 08, 2022 potassium is 3.7  On January 09, 2022 potassium was 3.6  On January 10, 2022 potassium is 3.6  On January 14, 2022 potassium is 3.5  On January 16, 2022 potassium was 3.4.    On June 19th 2023 potassium was 3.5  On January 22, 2022 potassium was 3.4-repleted     -Anemia-continue ferrous sulfate, monitor  On January 07, 2022 hemoglobin is 8.3  On January 14, 2022 hemoglobin is 8.4  On January 22, 2022 hemoglobin is 9.5  Check CBC in the morning    -Hypercalcemia, primary hyperparathyroidism  On January 07, 2022 calcium was 10.6  On January 08, 2022 calcium is 64  On January 09, 2022 calcium is 10.9  On January 10, 2022 calcium is 10.5  On January 14, 2022 calcium is 10.2  On January 20, 2022 calcium is 10.7  On January 22, 2022 calcium is 11.1  Patient follows with endocrinologist as an outpatient     Fall precautions,  aspiration precautions  I discussed with patient    Functional Progress:    PHYSICAL THERAPY    ON ADMISSION MOST RECENT   Balance  Balance  Posture: Poor  Sitting - Static: Good  Sitting - Dynamic: Fair, +  Standing - Static: Fair, -  Standing - Dynamic: Poor, +  Poor  Good  Fair;+  Fair;-  Poor;+      Balance  Balance  Posture: Poor  Sitting - Static: Good  Sitting - Dynamic: Fair, +  Standing - Static: Fair, -  Standing - Dynamic: Poor, +  Posture: Poor  Sitting - Static: Good  Sitting - Dynamic: Fair;+  Standing - Static: Fair;-  Standing - Dynamic: Poor;+        Bed Mobility  Maximum assistance  Maximum assistance  Moderate assistance (Pt requird increased encouragement and time to transition to seated position.)  Moderate Assistance  Maximum assistance  Moderate assistance    Bed Mobility  Rolling to Right: Minimal assistance  Rolling to Left: Minimal assistance  Supine to Sit: Minimal assistance  Sit to Stand: Stand by assistance  Sit to Supine: Minimal assistance  Bed to Chair: Stand by assistance      Wheelchair Mobility  Yes       Wheelchair Mobility  Propulsion: Yes         Ambulation  Rolling Walker  Moderate assistance  Level tile  12 Ambulation  Device: Agricultural consultant  Assistance: Stand by assistance  Surface: Level tile  Distance: 61 Feet   Stairs  No  Left ascending (side stepping with B UE support on hand rail)  Minimal assistance Stairs  Stairs?: Yes  Rails: Left ascending (side stepping with B UE support on hand rail)  Assistance: Minimal assistance     Functional Progress:    OCCUPATIONAL THERAPY    ON ADMISSION MOST RECENT   Eating  Supervision Eating  Feeding: Supervision   Grooming  Supervision, Setup (sitting EOB) Grooming  Grooming: Supervision, Setup   Bathing  UB Bathing Supervision, Setup (sitting EOB)  LB Bathing Maximum assistance Bathing  UB Bathing   UE Bathing: Supervision, Setup  LB Bathing   DVVO([id number]:IRSW)@   Upper Body Dressing  Maximum assistance   Upper Body Dressing  UE  Dressing: Supervision, Setup   Lower Body Dressing  Maximum assistance Lower Body Dressing  LE Dressing: Minimal assistance   Toileting  Maximum assistance Toileting  Toileting: Minimal assistance   Toilet Transfers  Moderate assistance Toilet Transfers  Toilet Transfer: Minimal assistance   Tub Transfers  Tub Transfers: Not tested  Engineer, manufacturing Transfers: Not tested Tub Transfers  Tub Transfers  Tub Transfers: Not tested  SHOWER Transfers  Tub Transfers  Tub Transfers: Not tested     Legend:   7 - Independent   6 - Modified Independent   5 - Standby Assistance / Supervision / Set-up   4 - Minimum Assistance / Contact Guard Assistance   3 - Moderate Assistance   2 - Maximum Assistance   1 - Total Assistance / Dependent             Plan:     1. Justification for continued stay: Fair progression towards established rehabilitation goals.  Patient is now able to ambulate 61 feet with a rolling walker and standby assist in comparison to being able to ambulate 12 feet with a rolling walker and moderate assistance at the time of admission    2. Medical Issues being followed closely:    [x]   Fall and safety precautions     [x]   Wound Care     [x]   Bowel and Bladder Function     [x]   Fluid Electrolyte and Nutrition Balance     [x]   Pain Control      3. Issues that 24 hour rehabilitation nursing is following:    [x]   Fall and safety precautions     [  x]  Wound Care     [x]   Bowel and Bladder Function     [x]   Fluid Electrolyte and Nutrition Balance     [x]   Pain Control      [x]   Assistance with and education on in-room safety with transfers to and from the bed, wheelchair, toilet and shower.      4. Acute rehabilitation plan of care:    [x]   Continue current care and rehab.           [x]   Physical Therapy           [x]   Occupational Therapy           [x]   Speech Therapy     []   Hold Rehab until further notice     5. Medications:    [x]   MAR Reviewed     [x]   Continue Present Medications       6. Chemical DVT  Prophylaxis:      [x]   Enoxaparin     []   Unfractionated Heparin     []   Warfarin     []   NOAC     []   Aspirin     []   None     7. Mechanical DVT Prophylaxis:      [x]   TED Stockings     []   Sequential Compression Device     []   None     8. GI Prophylaxis:      []   PPI     []   H2 Blocker     [x]   None / Not indicated     9. Code status:Full    Dragon medical dictation software was used for portions of this report. Unintended errors may occur.      Signed:    , MD      January 22, 2022

## 2022-01-22 NOTE — Plan of Care (Signed)
Problem: Occupational Therapy - Adult  Goal: By Discharge: Performs self-care activities at highest level of function for planned discharge setting.  See evaluation for individualized goals.  Description: Occupational Therapy Goals   Long Term Goals  Initiated 01/07/2022 and to be accomplished within 2-3 week(s) 01/28/2022; updated 01/21/22  1. Pt will perform self-feeding with [facility].  2. Pt will perform grooming with S/Setup GM 01/21/22  3. Pt will perform UB bathing with S/Setup  GM 01/21/22  4. Pt will perform LB bathing with [facility].  GM 01/21/22  5. Pt will perform tub/shower transfer with [facility]. (to be addressed at later date)  6. Pt will perform UB dressing with S/Setup  GM 01/15/22    7. Pt will perform LB dressing with [facility].  Continue for consistency  GM 01/21/22  8. Pt will perform toileting task with [facility]. Continue for consistency   GM 01/21/22  9. Pt will perform toilet transfer with [facility]. GM 01/15/22  10.  Pt will perform an IADL task with good safety and [facility].      Short Term Goals   Initiated 01/07/2022 and to be accomplished within 7 day(s) 01/14/2022; update 01/15/22; updated 01/21/22  1. Pt will perform self-feeding with Setup.  GM 01/15/22  2. Pt will perform grooming with Setup.  GM 01/15/22  3. Pt will perform UB bathing with Setup. GM 01/15/22  4. Pt will perform LB bathing with [facility].  GM 01/15/22  5. Pt will perform tub/shower transfer with  [facility]. To be addressed at a later date  6. Pt will perform UB dressing with [facility].  GM 01/15/22  7. Pt will perform LB dressing with [facility]. GM 01/15/22  8. Pt will perform toileting task with [facility]. GM 01/15/22  9. Pt will perform toilet transfer with [facility].GM 01/15/22         Outcome: Progressing   Occupational Therapy TREATMENT    Patient: [name] Short   66 y.o.    Patient identified with name and DOB: Yes     Date: 01/22/2022    First Tx Session  Time In:  0939  Time Out:  1030    Second Tx Session  Time In: 1206  Time Out: 1230    Diagnosis: Acute CVA (cerebrovascular accident) [facility])  [I63.9]   Precautions: Restrictions/Precautions: Fall Risk, Surgical Protocols (Cervical Spinal Precautions)  Spinal Precautions: No Bending, No Lifting, No Twisting      Chart, occupational therapy assessment, plan of care, and goals were reviewed.     Pain:  Pt reports 6/10 pain or discomfort prior to treatment.    Pt reports 6/10 pain or discomfort post treatment.   Intervention Provided: N/A      SUBJECTIVE:   Patient stated "The weight help my arms pull myself in bed."  Pt educated on not pulling self up in bed with BUE 2/2 to cervical spinal precautions.  Pt instructed to use feet and elbows on bed on sit on EOB and side step to head of  bed wit staff present.  Pt verbalized understanding.     OBJECTIVE DATA SUMMARY:       THERAPEUTIC EXERCISE Daily Assessment   To increase strength, endurance, and promote ROM at BUE for ADLs.  Pt propelled w/c from room to gym with asst of IV pole. (First/second) session.  Pt required increased time to complete task.    UB bike up to 10 minutes without resistance to promote BUE ROM for ADLs.  Pt required rest break x1.  Instruct pt. in home exercise program: UB HEP ( bicep curls, chess press, chest pulls, butterfly wings, front raise,upright rows) at BUE using 2lb weights in hand seated in w/c. (first and second session) 3x10 with 2lb weight in hand.         ASSESSMENT:  Pt working on increasing strength, balance, and endurance for ADLs.  Pt noted not have an incontinence episode this session.   Progression toward goals:  []           Improving appropriately and progressing toward goals  [x]           Improving slowly and progressing toward goals  []           Not making progress toward goals and plan of care will be adjusted     PLAN:  Patient continues to benefit from skilled intervention to address the above impairments.  Continue treatment per established plan of care.  Discharge Recommendations:     Further Equipment Recommendations for Discharge:       Activity  Tolerance:    Fair    EDUCATION:   Education Provided: Role of Therapy;Plan of Care;Precautions;Safety;Transfer Training;Energy ;Fall Prevention Strategies  Education Method: Demonstration;Verbal  Barriers to Learning: None  Education Outcome: Verbalized understanding;Demonstrated understanding;Continued education needed    COMMUNICATION:   []  Home safety education was provided and the patient/caregiver indicated understanding.  [x]  Patient/family have participated as able in goal setting and plan of care.  []  Patient/family agree to work toward stated goals and plan of care.  []  Patient understands intent and goals of therapy, but is neutral about his/her participation.  []  Patient is unable to participate in goal setting and plan of care.    Please refer to the flowsheet for vital signs taken during this treatment.  After treatment:   []   Patient left in no apparent distress sitting up in chair   [x]   Patient left in no apparent distress in bed  []   Call bell left within reach  []   Nursing notified  []   Caregiver present  []   Bed alarm activated    Hand-off has been provided to reporting occupational therapist for team conference:N/A    Estimated LOS: Pt awaiting authorization for SNF placement.  IRF-PAI Completed: N/A  Entered Differentiated Treatment minutes: N/A    , OT

## 2022-01-22 NOTE — Plan of Care (Signed)
Problem: SLP Adult - Disturbed Thought Process  Goal: By Discharge: Demonstrates cognitive skills at highest level of function for planned discharge setting.   See evaluation for individualized goals.  Description: Long term goals (Initiated 01/07/22 to be accomplished by 01/21/2022)  Patient will:  1. Be oriented x 3 and recall events of the day, supervision.  2. Follow 3 part and complex commands, 90% accuracy.  3.  Listen to 3-4 sentence length paragraphs and respond to questions re: content with 90% accuracy.  4.  List 10-12 items in categories of increasing abstraction, supervision.  5.  Discuss similarities/differences given 2 items, 80-90% accuracy.    Short term goals (by 01/21/22)  Patient will:  1.  Be oriented x 3 and recall events of the day, min assist.  2.  Follow 3 part and complex commands, 70-60% accuracy.  3.  Listen to 3-4 sentence length paragraphs and respond to questions re: content with 70-80% accuracy.  4.  List 10-12 items in categories of increasing abstraction, min assist.  5.  Discuss similarities/differences given 2 items, 70-80% accuracy.      Note:   SPEECH-LANGUAGE TREATMENT    Patient: [name] Short (66 y.o. female)  Date: 01/22/2022  Diagnosis: Acute CVA (cerebrovascular accident) [facility]) [I63.9] Acute CVA (cerebrovascular accident) (HCC)      Precautions: Standard  PLOF: As per H&P     ASSESSMENT:  Patient was pleasant and participated in all tasks.              Improving appropriately and progressing toward goals  [x]        Improving slowly and progressing toward goals  []        Not making progress toward goals and plan of care will be adjusted      PLAN:  Patient continues to benefit from skilled intervention to address the above impairments.  Continue treatment per established plan of care.     SUBJECTIVE:       Patient stated "I've got to wash my hands".    OBJECTIVE:   Mental Status:  Patient was aware and alert during today's session.   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     PAIN:  Start of Tx: no report  End of Tx: no report    After treatment:   [x]        Patient left in no apparent distress sitting up in chair  []        Patient left in no apparent distress in bed  [x]        Call bell left within reach  []        Nursing notified  []        Caregiver present  []        Bed alarm activated      COMMUNICATION/EDUCATION:   []  Patient educated regarding compensatory speech/language/comprehension techniques provided via demonstration, verbalization and teach back of comprehension  [x]  Patient/family have participated as able in goal setting and plan of care.  [x]  Patient/family agree to work toward stated goals and  plan of care.  []  Patient understands intent and goals of therapy, neutral about participation.  []  Patient unable to participate in goal setting/plan of care secondary to cognition, hearing/vision deficits; education ongoing with interdisciplinary staff     Time in: 1030   Time out: 1100     HUNTER BOWEN       , CCC-SLP

## 2022-01-22 NOTE — Plan of Care (Signed)
Problem: Physical Therapy - Adult  Goal: By Discharge: Performs mobility at highest level of function for planned discharge setting.  See evaluation for individualized goals.  Description: Physical Therapy Short Term Goals  Initiated 01/07/2022, reassessed 01/14/2022, and to be accomplished within 7 day(s) (01/21/2022)  1.  Patient will supine to sit, sit to supine, roll right, and roll left  in bed with moderate assistance .  MET 01/14/2022   Updated Goal: Patient will supine to sit, sit to supine, roll right, and roll left  in bed with minimal assistance.  MET 01/21/2022  2.  Patient will transfer from bed to chair and chair to bed with minimal assistance/contact guard assist using the least restrictive device.  MET 01/14/2022   Updated Goal:  Patient will transfer from bed to chair and chair to bed with supervision using the least restrictive device.  Partially met 01/21/2022 - requires stand by and set up assistance for line management  3.  Patient will perform sit to stand with minimal assistance/contact guard assist.  MET 01/14/2022   Updated Goal:  Patient will perform sit to stand with supervision.  Partially met 01/21/2022 - requires stand by and set up assistance for line management  4.  Patient will ambulate with minimal assistance/contact guard assist for 50 feet with the least restrictive device.  MET 01/14/2022   Updated Goal:  Patient will ambulate with supervision for 150 feet with the least restrictive device.   Partially met 01/21/2022 - requires stand by and set up assistance for line management  5.  Patient will ascend/descend 3 stairs with B handrail(s) with minimal assistance/contact guard assist.  MET on last assessment on 01/15/2022    Physical Therapy Long Term Goals  Initiated 01/07/2022 and to be accomplished within 14-21 day(s) (01/28/2022)  1.  Patient will supine to sit, sit to supine, roll right, and roll left in bed with supervision/set-up.    2.  Patient will transfer from bed to chair and chair  to bed with supervision/set-up using the least restrictive device.  3.  Patient will perform sit to stand with supervision/set-up.  4.  Patient will ambulate with supervision/set-up for 150 feet with the least restrictive device.   5.  Patient will ascend/descend 4 stairs with 1 handrail(s) with supervision/set-up      Outcome: Progressing      PHYSICAL THERAPY WEEKLY PROGRESS NOTE    Patient: Jennilee Demarco (66 y.o. female)  Date: 01/22/2022  Diagnosis: Acute CVA (cerebrovascular accident) [facility]) [I63.9]   Precautions: Fall Risk, Cervical Spinal Precautions  Chart, physical therapy assessment, plan of care and goals were reviewed.    Time in:0800  Time out:0830  Entered Differentiated Treatment minutes: Yes    Patient seen for: Patient Education, Training and development officer, Balance Training    Time in:1100  Time out:1200  Entered Differentiated Treatment minutes: Yes    Patient seen for: Patient Education, Training and development officer, Personnel officer, Balance Training      Pain:  Pt pain was reported as 7/10 pre-treatment.  Pt pain was reported as 7/10 post-treatment.   Pt reports she received pain medication prior to treatment session.      Patient identified with name and DOB: Yes    SUBJECTIVE:     Pt is pleasant and cooperative.  Pt educated re: plan for treatment sessions and amount of time PT will be with pt but sherequires encouragement to facilitate movements in a timely manner and increased time for all mobility at times limiting opportunity for skilled  intervention.    OBJECTIVE DATA SUMMARY:     Education: Education Provided: Role of Therapy;Plan of Care;Precautions;Safety;Transfer Training;Energy Occupational hygienist;Fall Prevention Strategies  Education Method: Demonstration;Verbal  Barriers to Learning: None  Education Outcome: Verbalized understanding;Demonstrated understanding;Continued education needed  GROSS ASSESSMENT Weekly Progress Assessment 01/22/2022   AROM  RLE   LLE   Exceptions  Exceptions    Strength  RLE  LLE   Exception  Exception   Coordination  Generally Decreased   Tone  RLE  LLE   Hypertonic  Hypertonic   Sensation  Impaired   PROM  RLE  LLE   Exceptions  Exceptions     MMT Weekly Assessment   Right Lower Extremity Left Lower Extremity   Hip Flexion 3+ 3+   Knee Extension 4- 4   Knee Flexion 3+ 3+   Ankle Dorsiflexion 4- 4-   0/5 No palpable muscle contraction  1/5 Palpable muscle contraction, no joint movement  2-/5 Less than full range of motion in gravity eliminated position  2/5 Able to complete full range of motion in gravity eliminated position  2+/5 Able to initiate movement against gravity  3-/5 More than half but not full range of motion against gravity  3/5 Able to complete full range of motion against gravity  3+/5 Completes full range of motion against gravity with minimal resistance  4-/5 Completes full range of motion against gravity with minimal-moderate resistance  4/5 Completes full range of motion against gravity with moderate resistance  4+/5 Completes full range of motion against gravity with moderate-maximum resistance  5/5 Completes full range of motion against gravity with maximum resistance    POSTURE Weekly Progress Assessment 01/22/2022   Posture (WDL)  Exceptions to [facility]   Posture Assessment Forward Head, Rounded Shoulders       BALANCE Weekly Progress Assessment 01/22/2022   Posture Poor   Sitting - Static Good   Sitting - Dynamic Fair;+   Standing - Static Fair;-   Standing - Dynamic Poor;+   Comments       BED/CHAIR/WHEELCHAIR TRANSFERS Initial Assessment Weekly Progress Assessment 01/22/2022   Rolling Right Maximum assistance Minimal assistance with maximal cuing to decrease reliance of bed rails and avoid pulling to maintain spinal precautions   Rolling Left Maximum assistance Minimal assistance with maximal cuing to decrease reliance of bed rails and avoid pulling to maintain spinal precautions   Supine to Sit Moderate assistance (Pt requird increased encouragement  and time to transition to seated position.) Minimal assistance for log roll technique   Sit to Stand Moderate Assistance Stand by assistance   Sit to Supine Maximum assistance Minimal assistance for safe trunk control and maintaining precautions   Transfer Assist Score Moderate assistance  Contact guard assistance       Stand by assistance for safety with line management and device management with stand step transfers with RW with verbal cues for forward gaze and foot clearance.     Midwife Type N/A N/A       WHEELCHAIR MOBILITY/MANAGEMENT Initial Assessment Weekly Progress Assessment 01/22/2022   Able to Propel 70 Feet  150 Feet   Assist Level Moderate assistance  Supervision   Curbs/ramps assistance required    NT   Wheelchair management manages B brakes with brake extender  Manages B brakes, left brake with brake extender.   Comments  Pt requires maximal verbal cuing for efficient propulsion with B UE.       GAIT  Initial Assessment Weekly Progress Assessment 01/22/2022   Quality of Gait       Gait Deviations           WALKING INDEPENDENCE Initial Assessment Weekly Progress Assessment 01/22/2022   Assistive device Rolling Land   Ambulation assistance - surface Moderate assistance  Level tile  Stand By Assistance  Level tile     Distance 12 20 Feet x 2 trials in room, 120 Feet x 1 trial   Comments   Pt requires  Stand By Assistance for balance, safety and line management with maximal verbal cues for forward gaze and increased hip and knee flexion in swing phase of gait with pt demonstrating decreased right stance time and weight shift.   Ambulation-uneven surface      NT       STEPS/STAIRS Initial Assessment Weekly Progress Assessment 01/22/2022   Steps/Stairs ambulated  4  6"   Rail Use  Left ascending (side stepping with B UE support on hand rail)   Assistance Level NT on evaluation  Minimal assistance when assessed on 01/15/2022   Comments   Pt required minimal assistance for balance  and safety with ascending/descending stairs.   Curbs/Ramps   NT       ASSESSMENT:  Pt has progressed to achieve bed mobility goals and partially meet transfer and gait goals due to ongoing functional weakness and balance impairments as well as assistance needed for management of multiple lines.  Progression toward goals:  []       Improving appropriately and progressing toward goals  [x]       Improving slowly and progressing toward goals  []       Not making progress toward goals and plan of care will be adjusted     PLAN:  Patient continues to benefit from skilled intervention to address the above impairments.  Continue treatment per established plan of care.  Discharge Recommendations:  Home with Home health PT;24 hour supervision or assist;Subacute/Skilled Nursing Facility  Further Equipment Recommendations for Discharge:        Rolling Walker            Estimated Discharge Date: 01/24/22    Activity Tolerance:   Fair  Please refer to the flowsheet for vital signs taken during this treatment.  After treatment:   []  Patient left in no apparent distress in bed  [x]  Patient left in no apparent distress sitting up in chair  []  Patient left in no apparent distress in w/c mobilizing under own power  []  Patient left in no apparent distress dining area  []  Patient left in no apparent distress mobilizing under own power  [x]  Call bell left within reach  [x]  Nursing notified  []  Caregiver present  []  Bed alarm activated   [x]  Chair alarm activated    Hand-off has been provided to reporting physical therapist for team conference: N/A      Rivka Barbara, PT, DPT  01/22/2022

## 2022-01-22 NOTE — Progress Notes (Signed)
INTERDISCIPLINARY TEAM FOLLOW-UP  Patient: [name] Short (66 y.o. female)  Date: 01/22/2022  Diagnosis: Acute CVA (cerebrovascular accident) [facility]) [I63.9] Acute CVA (cerebrovascular accident) [facility])      Precautions:      Met with patient to discuss the findings from 01/22/2022 Team Conference.    Patient requested further information and/or had questions:   no       Lolita Cram

## 2022-01-23 ENCOUNTER — Telehealth: Payer: Self-pay | Admitting: *Deleted

## 2022-01-23 LAB — POCT GLUCOSE
POC Glucose: 306 mg/dL — ABNORMAL HIGH (ref 70–110)
POC Glucose: 335 mg/dL — ABNORMAL HIGH (ref 70–110)
POC Glucose: 99 mg/dL (ref 70–110)
POC Glucose: 297 mg/dL — ABNORMAL HIGH (ref 70–110)
POC Glucose: 224 mg/dL — ABNORMAL HIGH (ref 70–110)

## 2022-01-23 MED ORDER — VANCOMYCIN (VANCOCIN) INFUSION (OUTPATIENT ONLY)
1 refills | Status: AC
Start: 2022-01-23 — End: 2022-02-06

## 2022-01-23 MED ORDER — INSULIN LISPRO 100 UNIT/ML IJ SOLN
100 UNIT/ML | Freq: Three times a day (TID) | INTRAMUSCULAR | 0 refills | Status: AC
Start: 2022-01-23 — End: 2022-07-02

## 2022-01-23 MED ORDER — INSULIN GLARGINE 100 UNIT/ML SC SOLN
100 UNIT/ML | Freq: Every day | SUBCUTANEOUS | 0 refills | Status: DC
Start: 2022-01-23 — End: 2022-07-02

## 2022-01-23 MED ORDER — AMLODIPINE BESYLATE 5 MG PO TABS
5 MG | ORAL_TABLET | Freq: Every day | ORAL | 0 refills | Status: AC
Start: 2022-01-23 — End: 2022-06-20

## 2022-01-23 MED ORDER — ACETAMINOPHEN 500 MG PO TABS
500 MG | ORAL_TABLET | Freq: Four times a day (QID) | ORAL | 0 refills | Status: AC | PRN
Start: 2022-01-23 — End: ?

## 2022-01-23 MED ORDER — PIPERACILLIN-TAZOBACTAM (ZOSYN) INFUSION (OUTPATIENT ONLY)
1 refills | Status: DC
Start: 2022-01-23 — End: 2022-04-16

## 2022-01-23 MED ORDER — POLYETHYLENE GLYCOL 3350 17 G PO PACK
17 g | Freq: Every day | ORAL | 0 refills | Status: AC | PRN
Start: 2022-01-23 — End: 2022-02-22

## 2022-01-23 MED ORDER — INSULIN LISPRO 100 UNIT/ML IJ SOLN
100 UNIT/ML | INTRAMUSCULAR | 0 refills | Status: DC
Start: 2022-01-23 — End: 2022-08-25

## 2022-01-23 MED ORDER — HYDRALAZINE HCL 10 MG PO TABS
10 MG | ORAL_TABLET | Freq: Four times a day (QID) | ORAL | 0 refills | Status: AC | PRN
Start: 2022-01-23 — End: 2022-06-20

## 2022-01-23 MED ORDER — BIOMEPRO PO CPDR
ORAL_CAPSULE | Freq: Every day | ORAL | 0 refills | Status: AC
Start: 2022-01-23 — End: 2022-06-20

## 2022-01-23 MED ORDER — HYDROCODONE-ACETAMINOPHEN 5-325 MG PO TABS
5-325 MG | ORAL_TABLET | Freq: Four times a day (QID) | ORAL | 0 refills | Status: AC | PRN
Start: 2022-01-23 — End: 2022-01-26

## 2022-01-23 MED FILL — NORVASC 5 MG PO TABS: 5 MG | ORAL | Qty: 1

## 2022-01-23 MED FILL — HUMALOG 100 UNIT/ML IJ SOLN: 100 UNIT/ML | INTRAMUSCULAR | Qty: 4

## 2022-01-23 MED FILL — THERAPEUTIC-M PO TABS: ORAL | Qty: 1

## 2022-01-23 MED FILL — HYDROCODONE-ACETAMINOPHEN 5-325 MG PO TABS: 5-325 MG | ORAL | Qty: 1

## 2022-01-23 MED FILL — VANCOMYCIN HCL 750 MG IV SOLR: 750 MG | INTRAVENOUS | Qty: 750

## 2022-01-23 MED FILL — ATORVASTATIN CALCIUM 40 MG PO TABS: 40 MG | ORAL | Qty: 2

## 2022-01-23 MED FILL — LOVENOX 40 MG/0.4ML IJ SOSY: 40 MG/0.4ML | INTRAMUSCULAR | Qty: 0.4

## 2022-01-23 MED FILL — LANTUS 100 UNIT/ML SC SOLN: 100 UNIT/ML | SUBCUTANEOUS | Qty: 32

## 2022-01-23 MED FILL — PIPERACILLIN SOD-TAZOBACTAM SO 3.375 (3-0.375) G IV SOLR: 3.375 (3-0.375) g | INTRAVENOUS | Qty: 3375

## 2022-01-23 MED FILL — ASPIRIN LOW DOSE 81 MG PO CHEW: 81 MG | ORAL | Qty: 1

## 2022-01-23 MED FILL — FUROSEMIDE 40 MG PO TABS: 40 MG | ORAL | Qty: 1

## 2022-01-23 MED FILL — FERROUS SULFATE 325 (65 FE) MG PO TBEC: 325 (65 Fe) MG | ORAL | Qty: 1

## 2022-01-23 NOTE — Plan of Care (Signed)
Problem: Physical Therapy - Adult  Goal: By Discharge: Performs mobility at highest level of function for planned discharge setting.  See evaluation for individualized goals.  Description: Physical Therapy Short Term Goals  Initiated 01/07/2022, reassessed 01/22/2022, and to be progressed to long term goals  1.  Patient will supine to sit, sit to supine, roll right, and roll left  in bed with moderate assistance .  MET 01/14/2022   Updated Goal: Patient will supine to sit, sit to supine, roll right, and roll left  in bed with minimal assistance.  MET 01/21/2022  2.  Patient will transfer from bed to chair and chair to bed with minimal assistance/contact guard assist using the least restrictive device.  MET 01/14/2022   Updated Goal:  Patient will transfer from bed to chair and chair to bed with supervision using the least restrictive device.  Partially met 01/21/2022 - requires stand by and set up assistance for line management  3.  Patient will perform sit to stand with minimal assistance/contact guard assist.  MET 01/14/2022   Updated Goal:  Patient will perform sit to stand with supervision.  Partially met 01/21/2022 - requires stand by and set up assistance for line management  4.  Patient will ambulate with minimal assistance/contact guard assist for 50 feet with the least restrictive device.  MET 01/14/2022   Updated Goal:  Patient will ambulate with supervision for 150 feet with the least restrictive device.   Partially met 01/21/2022 - requires stand by and set up assistance for line management  5.  Patient will ascend/descend 3 stairs with B handrail(s) with minimal assistance/contact guard assist.  MET on last assessment on 01/15/2022    Physical Therapy Long Term Goals  Initiated 01/07/2022 and to be accomplished within 14-21 day(s) (01/28/2022)  1.  Patient will supine to sit, sit to supine, roll right, and roll left in bed with supervision/set-up.  MET 01/23/2022  2.  Patient will transfer from bed to chair and chair  to bed with supervision/set-up using the least restrictive device.  MET 01/23/2022  3.  Patient will perform sit to stand with supervision/set-up.  MET 01/23/2022  4.  Patient will ambulate with supervision/set-up for 150 feet with the least restrictive device.   Not Met - limited by incontinence and urinary frequency/urgency  5.  Patient will ascend/descend 4 stairs with 1 handrail(s) with supervision/set-up.  Not Med due to ongoing functional weakness    Outcome: Progressing       PHYSICAL THERAPY DISCHARGE NOTE    Patient: [name] Short (66 y.o. female)  Date: 01/23/2022  Diagnosis: Acute CVA (cerebrovascular accident) [facility]) [I63.9]   Precautions: Fall Risk;Surgical Protocols, Cervical Spinal Precautions  Chart, physical therapy assessment, plan of care and goals were reviewed.    Time in: 0800  Time out: 0848  Entered Differentiated Treatment minutes: Yes    Patient seen for: Patient Education, Training and development officer, Personnel officer, Hotel manager, Postural Re-education    Time in: 1100  Time out: 1210  Entered Differentiated Treatment minutes: Yes    Patient seen for: Patient Education, Training and development officer, Personnel officer, Hotel manager, Postural Re-education    Pain:  Pt pain was reported as 8/10 pre-treatment.  Pt pain was reported as 8/10 post-treatment.  Intervention: Pt's nursing staff notified and brought pt pain medication during treatment session.  Pt offered assistance with repositioning.    Patient identified with name and DOB: Yes    SUBJECTIVE:     Patient stated: "I'm going to need  to go back to my room," during gait training during second session re: toileting needs.  Pt requires maximal cuing for safety.  Pt is pleasant and cooperative throughout treatment sessions but requires maximal redirection to attend to quality of movement and mobility.    OBJECTIVE DATA SUMMARY:     Education: Education Given To: Patient  Education Provided: Role of Therapy;Plan of Care;Precautions;Safety;Transfer Training;Energy  Occupational hygienist;Fall Prevention Strategies  Education Method: Demonstration;Verbal  Barriers to Learning: None  Education Outcome: Verbalized understanding;Demonstrated understanding;Continued education needed  Problem List:    Decreased strength B LE  [x]      Decreased strength trunk/core  [x]      Decreased AROM   [x]      Decreased PROM  [x]     Decreased endurance  [x]      Decreased balance sitting  [x]      Decreased balance standing  [x]      Pain   [x]      Slow ambulation velocity  [x]     Decreased coordination  [x]     Decreased safety awareness  [x]       Functional Limitations:   Decreased independence with bed mobility  [x]      Decreased independence with functional transfers  [x]      Decreased independence with ambulation  [x]      Decreased independence with stair negotiation  [x]          GROSS ASSESSMENT Discharge Assessment 01/23/2022   AROM  RLE   LLE   Exceptions  Exceptions   Strength  RLE  LLE   Exception  Exception   Coordination  Generally Decreased   Tone  RLE  LLE   WFL  WFL   Sensation Impaired   PROM  RLE  LLE   Exceptions  Exceptions       POSTURE Discharge Assessment 01/23/2022   Posture (WDL)  Exceptions to [facility]   Posture Assessment Forward Head, Rounded Shoulders         BALANCE Discharge Assessment 01/23/2022   Posture Poor   Sitting - Static Good   Sitting - Dynamic Fair;+   Standing - Static Fair;-   Standing - Dynamic Poor;+      Ability to pick up small object from floor:Unable to safely perform at this time         BED/CHAIR/WHEELCHAIR TRANSFERS Initial Assessment Discharge Assessment   Rolling Right Maximum assistance Stand by assistance   Rolling Left Maximum assistance Stand by assistance   Supine to Sit Moderate assistance (Pt requird increased encouragement and time to transition to seated position.) Stand by assistance   Sit to Stand Moderate Assistance Stand by assistance   Sit to Supine Maximum assistance Stand by assistance   Transfer Assist Score Moderate assistance  Stand by assistance   Comments     Pt requires SBA for safety and line management with moderate to maximal verbal cuing for safe and efficient movement patterns with mobility and maintaining cervical spinal precautions.   Car Transfer    Based on functional transfers, pt would requires supervision with transition to standard sedan.  Limited by toileting needs this session.   Car Type  N/A       WHEELCHAIR MOBILITY/MANAGEMENT Initial Assessment Discharge Assessment   Able to Propel 70 Feet Distance not challenged this session but pt is able to propel w/c at least 150 feet at a supervision level   Assist Level   Supervision   Curbs/ramps assistance required   NT   Wheelchair management manages B  brakes with brake extender Manages B Brakes (left brake with brake extender)       GAIT Discharge Assessment 01/23/2022   Quality of Gait  Exceptions to WDL   Gait Deviations  Decreased B foot clearance, Decreased B step length, Decreased right stance time and weight shift, Decreased B hip and knee flexion in swing phase of gait       WALKING INDEPENDENCE Initial Assessment Discharge Assessment   Assistive device Rolling Land   Ambulation assistance - surface Moderate assistance  Level tile Stand by assistance  Level tile     Distance 12 117 Feet (limited by urgent need for toileting assistance) and household distances in room to/from commode   Comments           STEPS/STAIRS Initial Assessment Discharge Assessment   Steps/Stairs ambulated NT Not challenged this session but has been able to ascend/descend 4 (6") steps   Rail Use   NT Left ascending (side stepping with B UE support on hand rail)   Assistance Level   Minimal assistance   Comments    Pt utilized side stepping technique with minimal steadying assistance   Curbs/Ramps    NT       Therapeutic Exercises:   Seated LE Strength Training:  3 Sets of 10 Repetitions  AROM Right and Left LAQ  AROM Alternate Hip Flexion    ASSESSMENT:  Pt has progressed  slowly throughout rehab stay requiring prompting for initiation and efficient mobility.  Pt has achieved goals for bed mobility and transfers but has been limited with locomotion and stairs due to incontinence with urinary frequency and urgency and functional weakness with c/o B LE muscle spasms  LTGs: Pt met 3/5 long term goals     PLAN:  Pt would benefit from continued skilled physical therapy in order to improve independent functional mobility at a SNF versus Home Health level. Interventions may include range of motion (AROM, PROM B LE/trunk), motor function (B LE/trunk strengthening/coordination), activity tolerance (vitals, oxygen saturation levels), bed mobility training, balance activities, gait training (progressive ambulation program), and functional transfer training.     Discharge Recommendations:  Home with Home health PT;24 hour supervision or assist;Subacute/Skilled Nursing Facility  Further Equipment Recommendations for Discharge:        Rolling Walker       Activity Tolerance:   Fair  Please refer to the flowsheet for vital signs taken during this treatment.  After treatment:   []  Patient left in no apparent distress in bed  [x]  Patient left in no apparent distress sitting up in chair  []  Patient left in no apparent distress in w/c mobilizing under own power  []  Patient left in no apparent distress dining area  []  Patient left in no apparent distress mobilizing under own power  [x]  Call bell left within reach  [x]  Nursing notified  []  Caregiver present  []  Bed alarm activated   [x]  Chair alarm activated    Hand-off has been provided to reporting physical therapist for team conference: N/A      IRF-PAI Completed: Done      Rivka Barbara, PT, DPT  01/23/2022

## 2022-01-23 NOTE — Progress Notes (Signed)
Pt's daughter asking questions about wound vac at facility. Daughter states that the facility has ordered the vac and it will be present tomorrow. Daughter requesting to speak with MD. MD aware.

## 2022-01-23 NOTE — Progress Notes (Signed)
Pt has been accepted for transfer to [facility]. Pt's daughter to transport pt between 3-4pm. Nurse given information to call report, send scripts and take wound vac to a wet-to-dry dressing.     No further needs are noted at this time.

## 2022-01-23 NOTE — Plan of Care (Signed)
Problem: SLP Adult - Disturbed Thought Process  Goal: By Discharge: Demonstrates cognitive skills at highest level of function for planned discharge setting.   See evaluation for individualized goals.  Description: Long term goals (Initiated 01/07/22 to be accomplished by 01/21/2022)  Patient will:  1. Be oriented x 3 and recall events of the day, supervision.  2. Follow 3 part and complex commands, 90% accuracy.  3.  Listen to 3-4 sentence length paragraphs and respond to questions re: content with 90% accuracy.  4.  List 10-12 items in categories of increasing abstraction, supervision.  5.  Discuss similarities/differences given 2 items, 80-90% accuracy.    Short term goals (by 01/21/22)  Patient will:  1.  Be oriented x 3 and recall events of the day, min assist.  2.  Follow 3 part and complex commands, 70-60% accuracy.  3.  Listen to 3-4 sentence length paragraphs and respond to questions re: content with 70-80% accuracy.  4.  List 10-12 items in categories of increasing abstraction, min assist.  5.  Discuss similarities/differences given 2 items, 70-80% accuracy.      Note:   SPEECH-LANGUAGE TREATMENT    Patient: [name] Short (66 y.o. female)  Date: 01/23/2022  Diagnosis: Acute CVA (cerebrovascular accident) [facility]) [I63.9] Acute CVA (cerebrovascular accident) ([facility])      Precautions: Standard  PLOF: As per H&P     ASSESSMENT:  Patient was pleasant and participated in all tasks.   Progression toward goals:  [x]        Improving appropriately and progressing toward goals  []        Improving slowly and progressing toward goals  []        Not making progress toward goals and plan of care will be adjusted     PLAN:  Patient continues to benefit from skilled intervention to address the above impairments.  Continue treatment per established plan of care.     SUBJECTIVE:   Patient stated "I am all packed up".    OBJECTIVE:   Mental Status:  Patient was alert and focused for today's session.   Treatment &  Interventions:  Neuro-Linguistics:   IRF-PAI-   Independent  Categories-   Female musicians- Listed 11 independently                    1 given min assist      Ice cream toppings- Listed 13 independently          2 given min assist   Listening comprehension: Answering questions given paragraph- 90% accuracy given repeating of 2 paragraphs      Response & Tolerance to Activities:   Patient gives maximum effort to all therapy tasks.     PAIN:  Start of Tx: no report  End of Tx: no report    After treatment:   [x]        Patient left in no apparent distress sitting up in chair  []        Patient left in no apparent distress in bed  [x]        Call bell left within reach  []        Nursing notified  []        Caregiver present  []        Bed alarm activated      COMMUNICATION/EDUCATION:   []  Patient educated regarding compensatory speech/language/comprehension techniques provided via demonstration, verbalization and teach back of comprehension  [x]  Patient/family have participated as able in goal setting and plan of  care.  [x]  Patient/family agree to work toward stated goals and plan of care.  []  Patient understands intent and goals of therapy, neutral about participation.  []  Patient unable to participate in goal setting/plan of care secondary to cognition, hearing/vision deficits; education ongoing with interdisciplinary staff     Attempted:  1430 (toileting)                      1440 (Doctor entered)                      1530 (wound care)    Time in/out: 1600-1630    HUNTER BOWEN

## 2022-01-23 NOTE — Progress Notes (Signed)
Social worker advised me stating that the wound VAC at the skilled nursing facility was not available today and had been ordered and would likely be available tomorrow.  Social worker also states that she is not able to be sure of the facility will accept the patient tomorrow.  I discussed with daughter.  Daughter is concerned that without the wound VAC the decubitus ulcer may worsen.  Daughter states that she has spoken to the facility and they will accept her tomorrow.  Daughter wishes for patient to stay here overnight.

## 2022-01-23 NOTE — Progress Notes (Signed)
16:30 Wound vac removed,Wet to dry dressing applied to sacral area. patient preparing for discharge to skilled facility.   17:00 Patients daughter called with concerns about wound vac would not be arriving to facility until tomorrow am. Daughter wishes for patient to stay until tomorrow when wound vac arrives to skilled facility.  17:15 Discharge cancelled per Dr Garret Reddish, Patient will remain in ARU tonight. Wound vac will be reapplied on sacrum tonight.

## 2022-01-23 NOTE — IP Home Care (Signed)
Notified [facility]; Scheduling; Dietitian.  Active patient to transfer to  [facility] today.

## 2022-01-23 NOTE — Discharge Summary (Signed)
[facility]  [address], [place] [address]     INPATIENT REHABILITATION  DISCHARGE SUMMARY    Name: [name] Short MRN: [id number]   Age / Sex: 66 y.o. 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  Patient follows with Dr. Glee Arvin for this    CONSULTS CALLED: Nephrology, wound care      PROCEDURES DONE: None      BRIEF HISTORY:   This is a 66 year old African-American female with a history of diabetes and hypertension and a prior CVA who presented to the ED with failure to thrive.  Patient gave history of progressive generalized weakness.  Patient had a recent cervical laminectomy done by Dr. Ma Rings.  Patient was noted to have a sacral decubitus ulcer which as per the daughter was only a week or 2 old and had progressed quickly.  Patient was admitted.  Spine surgery was consulted and.  MRI of the spine was done.  Spine surgery did not recommend any further intervention.  General surgery saw the patient and patient underwent debridement of the sacral decubitus ulcer.  ID also saw the patient and patient was started on IV antibiotics and a PICC line was placed.  Patient had an episode of some right-sided paresthesias.  Patient was noted to have a acute stroke.  Patient was mobilized.  PT OT saw the  patient.  It was felt that patient may benefit from transitioning to the inpatient rehab.  For full details regarding hospital course please refer to chart.      COURSE IN THE IRF: Upon admission to the [facility], the patient underwent physical therapy, occupational therapy and speech therapy. The patient was able to actively participate in the rehabilitation activities and progressed . On discharge, the patient was able to perform the following activities:    Functional Progress:    PHYSICAL THERAPY    ON ADMISSION MOST RECENT   Balance  Poor  Good  Fair;+  Fair;-  Poor;+      Balance  Posture: Poor  Sitting - Static: Good  Sitting - Dynamic: Fair;+  Standing - Static: Fair;-  Standing - Dynamic: Poor;+        Bed Mobility  Maximum assistance  Maximum assistance  Moderate assistance (Pt requird increased encouragement and time to transition to seated position.)  Moderate Assistance  Maximum assistance  Moderate assistance    Bed Mobility  Rolling to Right: Stand by assistance  Rolling to Left: Stand by assistance  Supine to Sit: Stand by assistance  Sit to Stand: Stand by assistance  Sit to Supine: Stand by assistance  Bed to Chair: Stand by assistance      Wheelchair Mobility  Yes       Wheelchair Mobility  Propulsion:  Yes         Ambulation  Rolling Walker  Moderate assistance  Level tile  12 Ambulation  Device: Rolling Walker  Assistance: Stand by assistance  Surface: Level tile  Distance: 117 Feet (limited by urgent need for toileting assistance) and household distances in room to/from commode   Stairs  No  Left ascending (side stepping with B UE support on hand rail)  Minimal assistance Stairs  Stairs?: Yes  Rails: Left ascending (side stepping with B UE support on hand rail)  Assistance: Minimal assistance           Functional Progress:    OCCUPATIONAL THERAPY    ON ADMISSION MOST RECENT   Eating  Supervision Eating  Feeding: Supervision   Grooming  Supervision, Setup  (sitting EOB) Grooming  Grooming: Supervision, Setup   Bathing  UB Bathing Supervision, Setup (sitting EOB)  LB Bathing Maximum assistance Bathing  UB Bathing   UE Bathing: Supervision, Setup  LB Bathing   LE Bathing: Minimal assistance   Upper Body Dressing  Maximum assistance   Upper Body Dressing  UE Dressing: Supervision, Setup   Lower Body Dressing  Maximum assistance Lower Body Dressing  LE Dressing: Minimal assistance   Toileting  Maximum assistance Toileting  Toileting: Minimal assistance   Toilet Transfers  Moderate assistance Toilet Transfers  Toilet Transfer: Minimal assistance   Tub /Shower Transfers  Not tested Tub/Shower Transfers  Tub Transfers: Not tested     Legend:   7 - Independent   6 - Modified Independent   5 - Standby Assistance / Supervision / Set-up   4 - Minimum Assistance / Contact Guard Assistance   3 - Moderate Assistance   2 - Maximum Assistance   1 - Total Assistance / Dependent            ACUTE MEDICAL ISSUES ADDRESSED IN INPATIENT REHABILITATION FACILITY:     Patient's aspirin and statin were continued for the recent stroke.  Patient's spinal bone growth stimulator was continued..  Pain control was provided.  Wound care and wound VAC were continued.  Patient was seen by the wound care nurse.  The IV antibiotics were continued as per ID recommendations from the hospital.  Patient has a PICC line in place.  ID has recommended to continue the IV vancomycin and IV Zosyn through February 06, 2022.  Patient was started on a probiotic.  Sugars were monitored.  Insulin regimen was adjusted.  Patient is currently on regimen including basal insulin as well as prandial insulin as well as sliding scale coverage.  Patient had some hypokalemia and hypomagnesemia noted and these were repleted.  Patient was also noted to be anemic.  Ferrous sulfate was continued.  Patient was noted to be hypercalcemic and nephrology was consulted.  Patient has primary hyperparathyroidism and has a parathyroid adenoma.   Patient follows up with endocrinologist Dr. Glee Arvin as an outpatient.  Social worker services were involved.  It was felt that patient may benefit from transitioning to skilled nursing facility.  I have been told by the social worker that patient has been accepted to a skilled nursing facility and will be transferred today.      MEDICATIONS ON DISCHARGE:       Medication List        START taking these medications      amLODIPine 5 MG tablet  Commonly known as: NORVASC  Take 1 tablet by mouth daily  Start taking on: January 24, 2022  hydrALAZINE 10 MG tablet  Commonly known as: APRESOLINE  Take 1 tablet by mouth every 6 hours as needed (As needed for systolic blood pressure greater than 160)     lactobacillus delayed release capsule  Take 1 capsule by mouth daily (with breakfast)  Start taking on: January 24, 2022            CHANGE how you take these medications      acetaminophen 500 MG tablet  Commonly known as: TYLENOL  Take 1 tablet by mouth every 6 hours as needed for Fever or Pain  What changed: when to take this     HYDROcodone-acetaminophen 5-325 MG per tablet  Commonly known as: NORCO  Take 1 tablet by mouth every 6 hours as needed for Pain for up to 3 days. Max Daily Amount: 4 tablets  What changed: when to take this     insulin glargine 100 UNIT/ML injection vial  Commonly known as: LANTUS  Inject 34 Units into the skin daily  What changed: how much to take     * insulin lispro 100 UNIT/ML Soln injection vial  Commonly known as: HUMALOG  Inject 4 Units into the skin 3 times daily (with meals)  What changed: how much to take     * insulin lispro 100 UNIT/ML Soln injection vial  Commonly known as: HUMALOG  Sliding Scale insulin as per Institutional protocol  What changed: You were already taking a medication with the same name, and this prescription was added. Make sure you understand how and when to take each.     piperacillin-tazobactam  infusion  Commonly known as: ZOSYN  Compound per protocol.   Continue IV Zosyn per pharmacy dosing through February 06, 2022  What changed:   how much to take  how to take this  when to take this  additional instructions     polyethylene glycol 17 g packet  Commonly known as: GLYCOLAX  Take 17 g by mouth daily as needed for Constipation  What changed:   when to take this  reasons to take this     vancomycin  infusion  Commonly known as: VANCOCIN  Compound per pharmacy protocol.  Continue vancomycin per pharmacy dosing through February 06, 2022  What changed:   how much to take  how to take this  when to take this  additional instructions           * This list has 2 medication(s) that are the same as other medications prescribed for you. Read the directions carefully, and ask your doctor or other care provider to review them with you.                CONTINUE taking these medications      aspirin 81 MG chewable tablet  Take 1 tablet by mouth daily     atorvastatin 80 MG tablet  Commonly known as: LIPITOR  Take 1 tablet by mouth nightly     ferrous sulfate 325 (65 Fe) MG tablet  Commonly known as: IRON 325  Take 1 tablet by mouth daily     FreeStyle Libre 3 Sensor Misc     furosemide 40 MG tablet  Commonly known as: LASIX     therapeutic multivitamin-minerals tablet  Take 1 tablet by mouth daily            STOP taking these medications      Bydureon BCise 2 MG/0.85ML injection  Generic drug: Exenatide ER  enoxaparin 40 MG/0.4ML  Commonly known as: LOVENOX     Glucagon Emergency 1 MG Kit     magnesium oxide 400 (240 Mg) MG tablet  Commonly known as: MAG-OX               Where to Get Your Medications        Information about where to get these medications is not yet available    Ask your nurse or doctor about these medications  acetaminophen 500 MG tablet  amLODIPine 5 MG tablet  hydrALAZINE 10 MG tablet  HYDROcodone-acetaminophen 5-325 MG per tablet  insulin glargine 100 UNIT/ML injection vial  insulin lispro 100 UNIT/ML Soln injection vial  insulin lispro 100 UNIT/ML Soln injection  vial  lactobacillus delayed release capsule  piperacillin-tazobactam  infusion  polyethylene glycol 17 g packet  vancomycin  infusion           DISCHARGE VITAL SIGNS:  Vitals:    01/23/22 0814   BP: (!) 148/72   Pulse: 78   Resp: 17   Temp: 96.9 F (36.1 C)   SpO2: 100%       DISCHARGE PHYSICAL EXAMINATION:  General:  Awake, alert  Cardiovascular:  S1S2+, RRR  Pulmonary:  CTA b/l  GI:  Soft, BS+, NT, ND  Extremities:  +edema  Patient has sacral decubitus ulcer and has wound VAC in place    CONDITION ON DISCHARGE: Stable.      DISPOSITION: Skilled nursing facility      FOLLOW-UP RECOMMENDATIONS:   Follow-up Information       Follow up With Specialties Details Why Contact Info    Elly Modena Deatherage, APRN - NP Nurse Practitioner Follow up  [place]  [place] [address]  [place]   [address]  [place]  [place], APRN - NP Nurse Practitioner Schedule an appointment as soon as possible for a visit in 1 week(s)  [place]  [place] [address]  [place], MD Orthopedic Surgery Schedule an appointment as soon as possible for a visit in 2 week(s)  [place] [address]  [place] [address]  [phone]      Nestor Ramp, [facility], Bariatrics Follow up in 2 week(s)  [place]  [place] [address]  [phone]      Jeralyn Bennett, MD Neurology Follow up in 2 week(s)  [address]  [address]  [place] [address]  [phone]          Patient will also need to follow-up with endocrinologist Dr. Glee Arvin in 2 to 3 weeks    OTHER INSTRUCTIONS:  Diet- Cardiac, Diabetic,  Juven with breakfast and dinner  Activity - as tolerated  FALL PRECAUTIONS  ASPIRATION PRECAUTIONS  DECUBITUS PRECAUTIONS  Cervical spinal precautions  Graded compression stockings bilateral lower extremities-use as directed  Incentive  Spirometry Q30 minutes when awake  PT, OT Evaluate and Treat  Check CBC, CMP, Mg once every week while on the IV antibiotics.  Send results to supervising physician at facility immediately  Routine PICC line care.  Please have trained medical staff discontinue the PICC line once course of antibiotics is completed.  Notify supervising physician at facility immediately if patient has no bowel movement for 2 consecutive  days.  Wound Care Orders : Wound vac dressing first application & dressing changes by unit staff nurses every Monday, Wednesday, & Friday on day shift. Measure wound size & document with each dressing change. Settings: 122mHG low continuous suction.    I discussed discharge plans with the patient and her daughter at bedside.  I have discussed with the sEducation officer, museum  I have discussed with the nurse.    TIME SPENT ON DISCHARGE ACTIVITIES: More than 35 minutes.      Signed:  VMichel Bickers MD      01/23/2022

## 2022-01-23 NOTE — Plan of Care (Signed)
Problem: Occupational Therapy - Adult  Goal: By Discharge: Performs self-care activities at highest level of function for planned discharge setting.  See evaluation for individualized goals.  Description: Occupational Therapy Goals   Long Term Goals  Initiated 01/07/2022 and to be accomplished within 2-3 week(s) 01/28/2022; updated 01/21/22  1. Pt will perform self-feeding with ModI.  2. Pt will perform grooming with S/Setup GM 01/21/22  3. Pt will perform UB bathing with S/Setup  GM 01/21/22  4. Pt will perform LB bathing with Mina.  GM 01/21/22  5. Pt will perform tub/shower transfer with [facility]. (to be addressed at later date)  6. Pt will perform UB dressing with S/Setup  GM 01/15/22    7. Pt will perform LB dressing with Mina.  Continue for consistency  GM 01/21/22  8. Pt will perform toileting task with [facility]. Continue for consistency   GM 01/21/22  9. Pt will perform toilet transfer with [facility]. GM 01/15/22  10.  Pt will perform an IADL task with good safety and Mina.      Short Term Goals   Initiated 01/07/2022 and to be accomplished within 7 day(s) 01/14/2022; update 01/15/22; updated 01/21/22  1. Pt will perform self-feeding with Setup.  GM 01/15/22  2. Pt will perform grooming with Setup.  GM 01/15/22  3. Pt will perform UB bathing with Setup. GM 01/15/22  4. Pt will perform LB bathing with [facility].  GM 01/15/22  5. Pt will perform tub/shower transfer with  [facility]. To be addressed at a later date  6. Pt will perform UB dressing with Mina.  GM 01/15/22  7. Pt will perform LB dressing with Mod[place]. GM 01/15/22  8. Pt will perform toileting task with [facility]. GM 01/15/22  9. Pt will perform toilet transfer with Moda.GM 01/15/22         Outcome: Progressing   Occupational Therapy TREATMENT    Patient: [name] Short   66 y.o.    Patient identified with name and DOB: yes     Date: 01/23/2022    First Tx Session  Time In:  0937  Time Out:  1100    Diagnosis: Acute CVA (cerebrovascular accident) [facility]) [I63.9]   Precautions: Restrictions/Precautions: Fall  Risk, Surgical Protocols (Cervical Spinal Precautions)  Spinal Precautions: No Bending, No Lifting, No Twisting      Chart, occupational therapy assessment, plan of care, and goals were reviewed.     Pain:  Pt reports 7/10 pain or discomfort prior to treatment at buttocks  Pt reports 7/10 pain or discomfort post treatment at buttocks.   Intervention Provided: N/A      SUBJECTIVE:   Patient stated "I am leaving today."    OBJECTIVE DATA SUMMARY:       THERAPEUTIC ACTIVITY Daily Assessment   To promote BUE ROM, strength, FM dexterity, and problem solving for ADLs.  Pt completed a 24 piece puzzle placed on a 65 degree incline.  Pt required vcs for instruction x2 to build puzzle from bottom to top. Pt required increased time to complete most of the puzzle.      THERAPEUTIC EXERCISE Daily Assessment   To increase strength, endurance, and promote ROM at BUE for ADLs.  Pt propelled w/c from room to gym with S and total asst of IV pole.     UB bIke up to 10 minutes without resistance to promote ROM for ADLs.      ASSESSMENT:  Pt increasing strength and endurance for ADLs.   Progression toward goals:  []   Improving appropriately and progressing toward goals  [x]           Improving slowly and progressing toward goals  []           Not making progress toward goals and plan of care will be adjusted     PLAN:  Patient continues to benefit from skilled intervention to address the above impairments.  Continue treatment per established plan of care.  Discharge Recommendations:  Skilled Nursing Facility  Further Equipment Recommendations for Discharge:  N/A     Activity Tolerance:    Fair     EDUCATION:   Education Given To: Patient  Education Provided: Role of Therapy;Plan of Care;Precautions;Safety;Transfer Training;Energy ;Fall Prevention Strategies  Education Method: Demonstration;Verbal  Barriers to Learning: None  Education Outcome: Verbalized understanding;Demonstrated  understanding;Continued education needed    COMMUNICATION:   []  Home safety education was provided and the patient/caregiver indicated understanding.  [x]  Patient/family have participated as able in goal setting and plan of care.  []  Patient/family agree to work toward stated goals and plan of care.  []  Patient understands intent and goals of therapy, but is neutral about his/her participation.  []  Patient is unable to participate in goal setting and plan of care.    Please refer to the flowsheet for vital signs taken during this treatment.  After treatment:   []   Patient left in no apparent distress sitting up in chair   [x]   Patient left in no apparent distress in bed  []   Call bell left within reach  []   Nursing notified  []   Caregiver present  []   Bed alarm activated    Hand-off has been provided to reporting occupational therapist for team conference N/A    Estimated to SNF today  IRF-PAI Completed: yes   Entered Differentiated Treatment minutes: N/A    Administrator, sports, OT

## 2022-01-24 LAB — BASIC METABOLIC PANEL
Anion Gap: 5 mmol/L (ref 3.0–18)
BUN/Creatinine Ratio: 43 — ABNORMAL HIGH (ref 12–20)
BUN: 29 mg/dL — ABNORMAL HIGH (ref 7.0–18)
CO2: 28 mmol/L (ref 21–32)
Calcium: 10.9 mg/dL — ABNORMAL HIGH (ref 8.5–10.1)
Chloride: 109 mmol/L (ref 100–111)
Creatinine: 0.67 mg/dL (ref 0.6–1.3)
Est, Glom Filt Rate: >60 mL/min/{1.73_m2} (ref >60)
Glucose: 100 mg/dL — ABNORMAL HIGH (ref 74–99)
Potassium: 3.5 mmol/L (ref 3.5–5.5)
Sodium: 142 mmol/L (ref 136–145)

## 2022-01-24 LAB — POCT GLUCOSE
POC Glucose: 128 mg/dL — ABNORMAL HIGH (ref 70–110)
POC Glucose: 116 mg/dL — ABNORMAL HIGH (ref 70–110)
POC Glucose: 335 mg/dL — ABNORMAL HIGH (ref 70–110)
POC Glucose: 281 mg/dL — ABNORMAL HIGH (ref 70–110)

## 2022-01-24 MED ORDER — VIAL-MATE ADAPTOR
750 MG | Freq: Once | INTRAVENOUS | Status: DC
Start: 2022-01-24 — End: 2022-01-24

## 2022-01-24 MED FILL — LANTUS 100 UNIT/ML SC SOLN: 100 UNIT/ML | SUBCUTANEOUS | Qty: 32

## 2022-01-24 MED FILL — HYDROCODONE-ACETAMINOPHEN 5-325 MG PO TABS: 5-325 MG | ORAL | Qty: 1

## 2022-01-24 MED FILL — FERROUS SULFATE 325 (65 FE) MG PO TBEC: 325 (65 Fe) MG | ORAL | Qty: 1

## 2022-01-24 MED FILL — PIPERACILLIN SOD-TAZOBACTAM SO 3.375 (3-0.375) G IV SOLR: 3.375 (3-0.375) g | INTRAVENOUS | Qty: 3375

## 2022-01-24 MED FILL — FUROSEMIDE 40 MG PO TABS: 40 MG | ORAL | Qty: 1

## 2022-01-24 MED FILL — NORVASC 5 MG PO TABS: 5 MG | ORAL | Qty: 1

## 2022-01-24 MED FILL — THERAPEUTIC-M PO TABS: ORAL | Qty: 1

## 2022-01-24 MED FILL — VANCOMYCIN HCL 750 MG IV SOLR: 750 MG | INTRAVENOUS | Qty: 750

## 2022-01-24 MED FILL — LOVENOX 40 MG/0.4ML IJ SOSY: 40 MG/0.4ML | INTRAMUSCULAR | Qty: 0.4

## 2022-01-24 MED FILL — SODIUM CHLORIDE 0.9 % IV SOLN: 0.9 % | INTRAVENOUS | Qty: 250

## 2022-01-24 MED FILL — HUMALOG 100 UNIT/ML IJ SOLN: 100 UNIT/ML | INTRAMUSCULAR | Qty: 4

## 2022-01-24 MED FILL — ATORVASTATIN CALCIUM 40 MG PO TABS: 40 MG | ORAL | Qty: 2

## 2022-01-24 MED FILL — ASPIRIN LOW DOSE 81 MG PO CHEW: 81 MG | ORAL | Qty: 1

## 2022-01-24 NOTE — Progress Notes (Addendum)
Comprehensive Nutrition Assessment    Type and Reason for Visit:  Reassess, Wound    Nutrition Recommendations/Plan:    Continue supplement: Juven BID (95 kcal, 2.5 gm collagen protein each packet)  Continue all other nutrition interventions. Encourage/ monitor intake of meals and supplements.      Malnutrition Assessment:  Malnutrition Status:  At risk for malnutrition (Comment) (pt with pressure injury wound) (01/07/22 1349)    Context:  Acute Illness     Findings of the 6 clinical characteristics of malnutrition:  Energy Intake:  Mild decrease in energy intake (Comment)  Weight Loss:  No significant weight loss     Body Fat Loss:  Unable to assess     Muscle Mass Loss:  Unable to assess    Fluid Accumulation:  Mild    Grip Strength:  Not Performed    Nutrition Assessment:    Pt unavailable at time of visit. Has good meal intake per chart documentation, >76% meal intake. Unable to assess intake of juven supplement. Noted plan for possible discharge today.      Nutrition Related Findings:    BM 6/22. Pertinent meds: lipitor, ferrous sulfate, lasix, 32 unit lantus, SSI, lactobacillus (pt refusing), zosyn, MVI, vancomycin. Wound Type: Pressure Injury, Stage IV, Skin Tears, Deep Tissue Injury         Current Nutrition Intake & Therapies:    Average Meal Intake: 76-100%  Average Supplements Intake: Unable to assess  ADULT ORAL NUTRITION SUPPLEMENT; Breakfast, Dinner; Wound Healing Oral Supplement  ADULT DIET; Regular; 3 carb choices (45 gm/meal); Low Fat/Low Chol/High Fiber/2 gm Na; please provide sugar substitute only (no regular sugar); Likes potatoes instead of rice.    Anthropometric Measures:  Height: 4' 11"  (149.9 cm)  Ideal Body Weight (IBW): 95 lbs (43 kg)    Admission Body Weight: 156 lb 14.4 oz (71.2 kg)  Current Body Weight: 152 lb 1.6 oz (69 kg), 160.1 % IBW. Weight Source: Not Specified  Current BMI (kg/m2): 30.7  Usual Body Weight: 152 lb (68.9 kg)  % Weight Change (Calculated): 3.2  Weight Adjustment  For: No Adjustment  BMI Categories: Obese Class 1 (BMI 30.0-34.9)    Estimated Daily Nutrient Needs:  Energy Requirements Based On: Formula (MSJ x1.2-1.4)  Weight Used for Energy Requirements: Current  Energy (kcal/day): 1093-2355  Weight Used for Protein Requirements: Current  Protein (g/day): 83-104 (wt x1.2-1.5)  Method Used for Fluid Requirements: 1 ml/kcal  Fluid (ml/day): 7322-0254    Nutrition Diagnosis:   Increased nutrient needs related to increase demand for energy/nutrients as evidenced by wounds    Nutrition Interventions:   Food and/or Nutrient Delivery: Continue Current Diet, Mineral Supplement, Vitamin Supplement, Continue Oral Nutrition Supplement  Nutrition Education/Counseling: Education not indicated  Coordination of Nutrition Care: Continue to monitor while inpatient  Plan of Care discussed with: .    Goals:  Previous Goal Met: Goal(s) Achieved  Goals: Meet at least 75% of estimated needs, by next RD assessment       Nutrition Monitoring and Evaluation:   Behavioral-Environmental Outcomes: None Identified  Food/Nutrient Intake Outcomes: Diet Advancement/Tolerance, Food and Nutrient Intake, Supplement Intake, Vitamin/Mineral Intake  Physical Signs/Symptoms Outcomes: Biochemical Data, Chewing or Swallowing, GI Status, Fluid Status or Edema, Meal Time Behavior, Skin, Weight    Discharge Planning:    Continue current diet, Continue Oral Nutrition Supplement     Coralee North, RD  Contact: [phone]

## 2022-01-24 NOTE — Plan of Care (Addendum)
Problem: SLP Adult - Disturbed Thought Process  Goal: By Discharge: Demonstrates cognitive skills at highest level of function for planned discharge setting.   See evaluation for individualized goals.  Description: Long term goals (Initiated 01/07/22 to be accomplished by 01/21/2022)  Patient will:  1. Be oriented x 3 and recall events of the day, supervision.  2. Follow 3 part and complex commands, 90% accuracy.  3.  Listen to 3-4 sentence length paragraphs and respond to questions re: content with 90% accuracy.  4.  List 10-12 items in categories of increasing abstraction, supervision.  5.  Discuss similarities/differences given 2 items, 80-90% accuracy.    Short term goals (by 01/28/22)  Patient will:  1.  Be oriented x 3 and recall events of the day, min assist.  2.  Follow 3 part and complex commands, 90% accuracy.  3.  Listen to 3-4 sentence length paragraphs and respond to questions re: content with 900% accuracy.  4.  List 10-12 items in categories of increasing abstraction, Supervision  5.  Discuss similarities/differences given 2 items, 80% accuracy.      Note:   SPEECH-LANGUAGE TREATMENT    Patient: [name] Short (66 y.o. female)  Date: 01/24/2022  Diagnosis: Acute CVA (cerebrovascular accident) [facility]) [I63.9] Acute CVA (cerebrovascular accident) (HCC)      Precautions: Standard  PLOF: As per H&P     ASSESSMENT:  Patient was pleasant and participated in all tasks.   Progression toward goals:  [x]        Improving appropriately and progressing toward goals  []        Improving slowly and progressing toward goals  []        Not making progress toward goals and plan of care will be adjusted     PLAN:  Patient continues to benefit from skilled intervention to address the above impairments.  Continue treatment per established plan of care.     SUBJECTIVE:   Patient stated "it was a rough morning".    OBJECTIVE:   Mental Status:  Patient is aware and alert.   Treatment & Interventions:  Neuro-Linguistic Exercises:    Orientation:  Independent  Recent recall:  Independent  Categories:   5 second rule game (without time limit) 100% accuracy independently  Cognitive:  Name that Word game ( word finding with abstract cues) 95% accuracy independently        Response & Tolerance to Activities:   Patient was distracted with some pain and incontinence insecurities (purwick).     PAIN:  Start of Tx: 7.5/10  End of Tx: 7.5/10    After treatment:   []        Patient left in no apparent distress sitting up in chair  [x]        Patient left in no apparent distress in bed  [x]        Call bell left within reach  []        Nursing notified  []        Caregiver present  []        Bed alarm activated      COMMUNICATION/EDUCATION:   []  Patient educated regarding compensatory speech/language/comprehension techniques provided via demonstration, verbalization and teach back of comprehension  [x]  Patient/family have participated as able in goal setting and plan of care.  [x]  Patient/family agree to work toward stated goals and plan of care.  []  Patient understands intent and goals of therapy, neutral about participation.  []  Patient unable to participate in goal setting/plan of care secondary  to cognition, hearing/vision deficits; education ongoing with interdisciplinary staff     Time in/out: 1100-1200    HUNTER BOWEN  Ernst Breach, CCC-SLP

## 2022-01-24 NOTE — Progress Notes (Signed)
Patient was scheduled to discharge to SNF yesterday, however daughter raised concerns that SNF did not have wound vac available and wanted to stay an additional night on the ARU.     NP called SNF ([facility]) this morning to confirm that bed is still available for patient today. Spoke with [name], RN Admission Coordinator at SNF and she states patient is clear to arrive today between 3pm and 4pm. RN stated that they will have wound vac available today when patient arrives.     NP discussed new discharge plans for today with patient, patient's daughter [name] Short) and attending.     [name] Rives, APRN - CNP

## 2022-01-24 NOTE — Plan of Care (Signed)
Problem: Chronic Conditions and Co-morbidities  Goal: Patient's chronic conditions and co-morbidity symptoms are monitored and maintained or improved  Outcome: Adequate for Discharge     Problem: Safety - Adult  Goal: Free from fall injury  Outcome: Adequate for Discharge     Problem: Skin/Tissue Integrity  Goal: Absence of new skin breakdown  Description: 1.  Monitor for areas of redness and/or skin breakdown  2.  Assess vascular access sites hourly  3.  Every 4-6 hours minimum:  Change oxygen saturation probe site  4.  Every 4-6 hours:  If on nasal continuous positive airway pressure, respiratory therapy assess nares and determine need for appliance change or resting period.  Outcome: Adequate for Discharge     Problem: ABCDS Injury Assessment  Goal: Absence of physical injury  Outcome: Adequate for Discharge     Problem: Pain  Goal: Verbalizes/displays adequate comfort level or baseline comfort level  Outcome: Adequate for Discharge  Flowsheets (Taken 01/24/2022 0800)  Verbalizes/displays adequate comfort level or baseline comfort level:   Encourage patient to monitor pain and request assistance   Assess pain using appropriate pain scale   Administer analgesics based on type and severity of pain and evaluate response   Implement non-pharmacological measures as appropriate and evaluate response   Consider cultural and social influences on pain and pain management   Notify Licensed Independent Practitioner if interventions unsuccessful or patient reports new pain     Problem: Nutrition Deficit:  Goal: Optimize nutritional status  Outcome: Adequate for Discharge  Flowsheets (Taken 01/24/2022 1454 by Brayton El, RD)  Nutrient intake appropriate for improving, restoring, or maintaining nutritional needs: Assess nutritional status and recommend course of action     Problem: SLP Adult - Disturbed Thought Process  Goal: By Discharge: Demonstrates cognitive skills at highest level of function for planned discharge  setting.   See evaluation for individualized goals.  Description: Long term goals (Initiated 01/07/22 to be accomplished by 01/21/2022)  Patient will:  1. Be oriented x 3 and recall events of the day, supervision.  2. Follow 3 part and complex commands, 90% accuracy.  3.  Listen to 3-4 sentence length paragraphs and respond to questions re: content with 90% accuracy.  4.  List 10-12 items in categories of increasing abstraction, supervision.  5.  Discuss similarities/differences given 2 items, 80-90% accuracy.    Short term goals (by 01/21/22)  Patient will:  1.  Be oriented x 3 and recall events of the day, min assist.  2.  Follow 3 part and complex commands, 70-60% accuracy.  3.  Listen to 3-4 sentence length paragraphs and respond to questions re: content with 70-80% accuracy.  4.  List 10-12 items in categories of increasing abstraction, min assist.  5.  Discuss similarities/differences given 2 items, 70-80% accuracy.      01/24/2022 1354 by Lindi Adie  Note:   SPEECH-LANGUAGE TREATMENT    Patient: [name] Short (66 y.o. female)  Date: 01/24/2022  Diagnosis: Acute CVA (cerebrovascular accident) [facility]) [I63.9] Acute CVA (cerebrovascular accident) (HCC)      Precautions: Standard  PLOF: As per H&P     ASSESSMENT:  Patient was pleasant and participated in all tasks.   Progression toward goals:  [x]        Improving appropriately and progressing toward goals  []        Improving slowly and progressing toward goals  []        Not making progress toward goals and plan of care will be adjusted  PLAN:  Patient continues to benefit from skilled intervention to address the above impairments.  Continue treatment per established plan of care.     SUBJECTIVE:   Patient stated "it was a rough morning".    OBJECTIVE:   Mental Status:  Patient is aware and alert.   Treatment & Interventions:  Neuro-Linguistic Exercises:   Orientation:  Independent  Recent recall:  Independent  Categories:   5 second rule game (without time limit) 100%  accuracy independently  Cognitive:  Name that Word game ( word finding with abstract cues) 95% accuracy independently        Response & Tolerance to Activities:   Patient was distracted with some pain and incontinence insecurities (purwick).     PAIN:  Start of Tx: 7.5/10  End of Tx: 7.5/10    After treatment:   []        Patient left in no apparent distress sitting up in chair  [x]        Patient left in no apparent distress in bed  [x]        Call bell left within reach  []        Nursing notified  []        Caregiver present  []        Bed alarm activated      COMMUNICATION/EDUCATION:   []  Patient educated regarding compensatory speech/language/comprehension techniques provided via demonstration, verbalization and teach back of comprehension  [x]  Patient/family have participated as able in goal setting and plan of care.  [x]  Patient/family agree to work toward stated goals and plan of care.  []  Patient understands intent and goals of therapy, neutral about participation.  []  Patient unable to participate in goal setting/plan of care secondary to cognition, hearing/vision deficits; education ongoing with interdisciplinary staff     Time in/out: 1100-1200    HUNTER BOWEN

## 2022-01-24 NOTE — Progress Notes (Signed)
Report called to [facility]. Pt discharging from unit with daughter in no distress with stable vital signs. Pt signature obtained for discharge paperwork, wound vac removed, wet to dry dressing placed. Pt belongings with patient.

## 2022-01-24 NOTE — Progress Notes (Signed)
Norco script faxed to [phone], hard copy with pt discharge packet.

## 2022-01-24 NOTE — Telephone Encounter (Signed)
[facility] diagnostic(Sherry) calling for the size of the lesion sent on the 13th, where on the fifth toe was it removed?Please call her @ [phone].

## 2022-01-30 ENCOUNTER — Encounter: Payer: Medicare Other | Admitting: Podiatry

## 2022-02-03 ENCOUNTER — Inpatient Hospital Stay: Admit: 2022-02-03 | Payer: PRIVATE HEALTH INSURANCE | Primary: Physician Assistant

## 2022-02-03 DIAGNOSIS — A310 Pulmonary mycobacterial infection: Secondary | ICD-10-CM

## 2022-02-03 LAB — COMPREHENSIVE METABOLIC PANEL
ALT: 37 U/L (ref 10–49)
AST: 34 U/L — ABNORMAL HIGH (ref 0.0–33.9)
Albumin: 2.9 g/dL — ABNORMAL LOW (ref 3.4–5.0)
Alkaline Phosphatase: 139 U/L — ABNORMAL HIGH (ref 46–116)
Anion Gap: 9 mmol/L (ref 5–15)
BUN: 20 mg/dL (ref 9–23)
CO2: 25 meq/L (ref 20–31)
Calcium: 10.5 mg/dL — ABNORMAL HIGH (ref 8.7–10.4)
Chloride: 112 meq/L — ABNORMAL HIGH (ref 98–107)
Creatinine: 0.87 mg/dL (ref 0.55–1.02)
GFR African American: >60
GFR Non-African American: >60
Glucose: 206 mg/dL — ABNORMAL HIGH (ref 74–106)
Potassium: 3.8 meq/L (ref 3.5–5.1)
Sodium: 146 meq/L — ABNORMAL HIGH (ref 136–145)
Total Bilirubin: 0.2 mg/dL — ABNORMAL LOW (ref 0.30–1.20)
Total Protein: 6 g/dL (ref 5.7–8.2)

## 2022-02-05 ENCOUNTER — Inpatient Hospital Stay: Admit: 2022-02-05 | Primary: Physician Assistant

## 2022-02-05 DIAGNOSIS — A310 Pulmonary mycobacterial infection: Principal | ICD-10-CM

## 2022-02-05 LAB — VANCOMYCIN LEVEL, TROUGH: Vancomycin Tr: 45.8 ug/mL (ref 5.0–10.0)

## 2022-02-09 ENCOUNTER — Encounter: Admit: 2022-02-09 | Discharge: 2022-02-09 | Payer: PRIVATE HEALTH INSURANCE | Primary: Physician Assistant

## 2022-02-09 NOTE — Home Health (Addendum)
 Skilled services/Home bound verification:   Skilled Reason for admission/summary of clinical condition:  Infected sacral decubitus ulcer S/P Debridement requiring wound care.  This patient is homebound for the following reasons Requires considerable and taxing effort to leave the home  and Requires the assistance of 1 or more persons to leave the home .  Caregiver: daughter.  Caregiver assists with is able to assist with bathing, dressing, walking, turn in bed, bathroom, meal prep and setup, medication management, grocery shopping, household chores and transportation to MD appointment.  Medications reconciled and all medications are not available in the home this visit.    pt needing to get aspirin , iron . pt does not take probiotic, pt completed zosyn . pt unsure of norvasc - reporting she does not currently take and was not on discharge list. MD office notified of medication discrepancies- no returned call.  The following education was provided regarding medications: reviewed side effects, purposes, dosage, frequencies  Medications  are effective at this time.    High risk medication teaching regarding anticoagulants, hyperglycemic agents or opiod narcotics performed (specify) lantus , humalog , roxicodone -Instructed patient/caregiver that hypoglycemic medications may result in lower blood sugars than body is accustomed to, notify SN/PT of dizziness, confusion, nervousness, or any other symptoms of low blood sugar.  Instructed patient/caregiver to take exact amount of narcotics prescribed, signs and symptoms of oversedation, notify SN/PT if oversedated.  May cause constipation, notify SN/PT if no BM x 3 days.   MD notified of any discrepancies/look a like medications/medication interactions- NA no severe interactions- pt reporting she does not drink grapefruit juice.   Home health supplies by type and quantity ordered/delivered this visit include: sn ordered supplies, pt needs dakins from office.  Patient education  provided this visit to include: patient/cg instructed to monitor for edema/increase in edema, to elevate extremity when edema occurs and to notify md if edema exceeds normal limits for patient, none noted at this time.  patient made aware to monitor for s/s of infection [increased swelling, increased redness around site, increased pain, foul smelling drainage, fever] aware who to report to/when. no s/s of infection noted. encouraged patient to get three nutritional meals daily and to stay hydrated, drink plenty of fluids. pt instructed to follow a high protein diet for healing- to try to get 90g protein daily. Instructed patient/caregiver on low salt diet- patient made aware to restrict sodium intake to 2 g/day, to reduce sodium if weight begins to increase, educated on tips to limit sodium (avoiding foods high in sodium, getting rid of salt shaker, using herbs/spices, using fresh foods, avoiding salt substitutes which may contain potassium, buying foods labeled low-sodium or sodium free), reading food labels, and making changes slowly to give taste buds time to adjust.  discussed importance of monitoring blood pressure daily and recording for review, discussed hypertension, causes/long term effects of uncontrolled hypertension. pt denies any s/s of hypertension at this time- instructed to monitor for blurred vision, strong headaches, fatigue, confusion, etc.  Discussed diabetic dietary adjustments such as: reduce portion sizes, limit daily carbohydrate intake to not greater than 45-60 grams per meal for a total of <180 grams of carbohydrate daily, avoid concentrated carbohydrates such as soft drinks, sweet tea, and sweet treats, increase physical activity (if possible) along with strength training as tolerated to facilitate weight loss and build muscle mass. pt instructed to continue monitoring BS daily as ordered by MD and to ensure BS levels are within good range to ensure proper healing.- pt is aware of BS  parameters to report to MD/seek tx. discussed fall precautions in detail- having lighted hallways, removing throw rugs, monitoring medication that may alter mental status. patient aware to turn every 2 hours and to keep pressure off of bony prominences, to monitor for any pressure ulcer development/worsening. patient instructed to minimize clutter and to maintain clear pathways in order to prevent falls from occurring.  Patient level of understanding of education provided: patient has a partial understanding of education that was provided at this time by engaging in all education provided and is able to verbalize understanding, first visit and will require additional education. pt denies any questions or concerns at this time.  Sharps Education Provided: Clinician instructed patient/cg on proper disposal of sharps as follows: Containers should be made of hard plastic, be puncture-resistant and leakproof, such as a laundry detergent or bleach bottle.  When the container is  full, it should be sealed with tape and labeled.DO NOT RECYCLE prior to discarding in the regular trash.  Skilled Care Provided this visit: full body assessment and education as above. wound care performed per orders on referral to coccyx- old dressing completely removed, wound cleansed with saline and applied wet to dry (ordering supplies/dakins), secured with foam dressing. See wound addendum for description. SN unable to get a proper photo due to patient inability to turn well enough for SN to take a photo.  Patient response to procedure performed:  patient tolerated procedure with no signs of discomfort, grimacing or pain, no complications or concerns noted.  Home exercise program/Homework provided: patient instructed to perform sob hep 4-5 x daily and prn for sob, to promote lung expansion. pt also encouraged to use ICS q 2 hours and to perform sob hep during therapy. patient instructed to perform incontinence hep 3-4 x daily to strengthen  muscles and to perform timed voiding q 2 hours to prevent incontinence from occurring.  Pt/Caregiver instructed on plan of care and are agreeable to plan of care at this time.    Darshana K Chadda, DO notified of patient admission to home health and plan of care including anticipated frequency of 3w3 2w3 1w3 3 prn and treatments/interventions/modalities of wound care.  Discharge planning discussed with patient and caregiver.  Discharge planning as follows: Patient will be discharged once education has completed, patient is medically stable and pt/cg are able to independently manage wound care/ wound has healed or no longer requires skilled care.  Pt/Caregiver did verbalize understanding of discharge planning.   Next MD appointment TBD (date) with PCP.  Patient/caregiver encouraged/instructed to keep appointment as lack of follow through with physician appointment could result in discontinuation of home care services for non-compliance.

## 2022-02-10 ENCOUNTER — Encounter: Admit: 2022-02-10 | Discharge: 2022-02-10 | Payer: PRIVATE HEALTH INSURANCE | Primary: Physician Assistant

## 2022-02-10 NOTE — Home Health (Signed)
Pt is referred to home care for SN,PT, OT with Dx infected sacral decub s/p debriedement.  Pt is s/p cervical spinal fusion 12/09/21.  Pt had a CVA 12/26/21 and was admitted to acute care where she also had debridement of sacral decub.  From there she went to inpt rehab 6/5 - 6/23 and then to SNF for additional rehab prior to returning home 7/8.   Pt/dau report pt has been released from cervical brace and is allowed to move her head/neck in all directions but remains on lifting precautions.      PMHx:   1. Infected sacral decubitus ulcer    2. Cervical spondylosis with myelopathy- - S/P cervical C3-C6 laminectomy/fusion.    3. Cerebrovascular disease-Most recent stroke was in 12/2021.    4. HTN Chronic. Controlled.    5. D iabetes mellitus with hyperglycemia - Chronic with variable control.    6. Muscle spasms - Intermittent of bilateral UE (R> L). Start Baclofen 21m BID.    7. Slow transit constipation -- Uncontrolled. Exacerbated by limited mobility and medications    8. Iron deficiency anemia - Chronic. No active bleeding reported.    9. Hyperlipidemia -- Chronic. Continue statin daily.    10. Urinary incontinence - Chronic. Daughter reque sted a PureWick system for use at nighttime.protocol and PRN.    11. BMI 31.5-       SUBJECTIVE: "I was doing better in rehab"  LIVING SITUATION/PLOF: Pt lives with her dau in a one story home with 4 STE with rails.  Home is very cluttered with narrow paths to maneuver.  PTA pt was S/MOD I with mobility in the home using FWW.   CAREGIVER INVOLVEMENT/ ASSISTANCE NEEDED FOR: Transportation, mobility, ADL's, meal prep, med management  MEDICATIONS REVIEWED AND UPDATED: Meds reviewed with no changes  NEXT MD APPT: 7/12 with surgeon    ROM: B LE's WFL.  R LE is influenced by increased flexor tone  STRENGTH: L LE: hip 3/5, knee and ankle 3+/5.  R hip 2+/5, knee 3/5, ankle 3+/5.   WOUNDS: SN and pt's dau are managing scaral wound.   BED MOBILITY: Pt is sleeping and staying on her sofa.   She states she cannot safely get to her bedroom due to clutter in the home and also that her bed is not usable. Supine<>sit with MAX A.  Rolling L/R with MAX A.    TRANSFERS: MAX A sit<>stand from low sofa.  MOD A sit<>[facility]. MOD A stand/step sofa<>BSC with FWW.  GAIT: Pt amb a few steps to [facility] and MOD A.  Gait characterized by flexed trunk, shuffling gait, flexed hips and knees  STAIRS: NT  BALANCE: Unable to perform Tinetti as pt is not amb 10' and cannot stand w/o A.      blood glucose 102    HEP consisting of:  1. Change positions every 1-2 hours: stand with FWW and assist, roll side<>side on sofa/bed  2. Seated: ankle pumps, hip flexion, LAQ  3. Deep breathing   Patient/caregiver verbalized understanding but will need reinforcement for consistency and progress.      PATIENT EDUCATION PROVIDED THIS VISIT: safety, HEP, mobility, deep breathing, change positions often and avoid sitting on sacral ulcer    PATIENT RESPONSE TO EDUCATION PROVIDED: Pt/CG verbalizes understanding of all information and demo's back as appropriate   PATIENT RESPONSE TO EVALUATION/TREATMENT: Patient demonstrated a positive outcome post treatment and reported increased comfort and increased confidence with transfers  and mobility. Patient reported good understanding of the HEP and reports confidence in ability to complete the program as prescribed by PT.    ASSESSMENT AND CONTINUED NEED FOR THE FOLLOWING SKILLS:  Pt is s/p cervical fusion 5/8 with CVA and debridement of scaral decub 5/23.  She went to inpt rehab and SNF prior to returning home.  Her dau is her CG and reports it is very difficult for her to transfer pt at this time as pt is staying on a low sofa.  Pt and her dau report pt cannot safely get to her room due to clutter and that her bed is "broken".  Pt would benefit from a hospital bed for positioning to relieve pressure and allow wound healing as well as ease of transferring.  The home is very  cluttered and pt's dau reports she is working on making clear paths to get from room to room.    Pt presents with: decreased strength, impaired gait, decreased ability w stair negotiation, decreased transfer status, decreased endurance, decreased balance and decreased safety, increased pain. HHPT is medically necessary in order to improve functional mobility/quality of life, decrease burden of care, reduce risk for re-hospitalization, work towards patient's personal goals of return to PLOF w decrease risk for falls.  Goals established for increased independence in the home, safe mobility in the home, improvement in strength and ROM - all designed to reduce fall risk and progress toward independence. Patient will benefit from continued PT intervention to progress toward meeting all established goals     PLAN: Pt will be seen 2W6 1W1 to establish and upgrade HEP, gait training with the least restrictive A DEV on flat and uneven surfaces, bed mobility training, transfer training, stair training, dynamic standing balance re -education. Reinforece general safety precautions.      DISCHARGE PLANNING DISCUSSED: Discharge to self and family under MD supervision once all goals have been met or patient has reached max potential.

## 2022-02-11 ENCOUNTER — Encounter: Admit: 2022-02-11 | Discharge: 2022-02-11 | Payer: PRIVATE HEALTH INSURANCE | Primary: Physician Assistant

## 2022-02-11 NOTE — Home Health (Signed)
Skilled reason for visit: Wound care, disease process education, medication management     Caregiver involvement: Pt resides in single story home with her daughter, amb with use of a walker, daughter is pts CG and assts with ADL's, medication, appts and transportation and is available 24/7 to asst as needed.    Home health supplies by type and quantity ordered/delivered this visit include: N/A    Medications reviewed and all medications are available in the home this visit.     The following education was provided regarding medications: SN Instructed pt and CG not to stop, start or change the way she is currently taking any medications without consulting with the MD    - SN reminded pt/CG on opioid/narcotic purpose, dose, appearance, preparation, and scheduling; signs/symptoms for which to notify home health/physician such as: dizziness, sleepiness, constipation, nausea, vomiting, itchiness, dry mouth. Instruct patient/caregiver on signs and symptoms of overdose for which to call emergency services such as: seizures, inability to be woken, trouble staying awake, slow or shallow breathing, slurred speech, blue lips or fingers, confusion, inability to urinate, or inability to stop vomiting. Instructed pt on methods to mitigate risk such as: taking medications as prescribed, keeping a schedule of medications, not taking opioids that are not prescribed to pt, avoiding alcohol, eating high fiber foods, staying hydrated, and exercising as tolerated.    MD notified of any discrepancies/look a-like medications/medication interactions: NA    Medications are effective at this time.     Patient education provided this visit:    -SN instructed pt/CG to notify home health or physician for the following wound healing complications: increased drainage, pus or a foul odor coming from the wound, fever, muscle, joint, or body aches, sweating, increased swelling, redness or red streaks surrounding the wound; dramatic change in color  or size of wound; stitches coming apart or wound reopening; increase in pain at wound site in spite of interventions.    -SN instructed pt/CG on importance of adequate nutrition and hydration for wound healing. Healthy foods give your body the nutrients it needs to heal wounds. Protein foods like meat, fish, nuts, and soy products are important to wound healing. In addition to protein, calories, vitamin C, and zinc help wounds heal. Liquids prevent dehydration that can decrease the blood supply to wounds. Always follow physician recommended diet in regards to patient condition. Instruct on other factors that affect wound healing such as: maintaining general hygiene, not smoking or using tobacco products, offloading pressure and pressure relieving devices, and management of blood sugars.    -SN instructed pt/CG that a CVA/stroke is the term used when a part of the brain is damaged because of a problem with blood flow. Risk factors for stroke include: older age, family history of stroke, diabetes, kidney disease, high cholesterol, plaque buildup in arteries, atrial fibrillation, high blood pressure, smoking cigarettes, drinking too much alcohol, or using illicit drugs, not enough physical activity, obesity, or the use of hormone replacement therapies (in women). Ischemic strokes happen when an artery going to the brain gets clogged or closes off, and part of the brain goes without blood for too long. Treatment for ischemic strokes focuses on reopening clogged arteries. This can be achieved through the use of 'clot-busting' medications and additional medications that help prevent new clots from forming.    -SN instructed pt/CG that symptoms of stroke usually come on suddenly. Signs/symptoms to watch for can easily be recalled using the phrase "BE FAST" each letter in the word  stands for one of the things you should watch for and what to do about it:  Balance - does the person have a sudden loss of balance?  Eyes - Has  the person lost vision in one or both eyes?  Face - Does the person's face look uneven or droop on one side?   Arm - Does the person have weakness or numbness in one or both arms? Does one arm drift down if the person tries to hold both arms out?   Speech - Is the person having trouble speaking? Does his or her speech sound strange?   Time - If you notice any of these stroke signs, call emergency services. You need to act FAST. The sooner treatment begins, the better the chances of recovery.    -SN instructed pt/CG on risks for pressure injury such as: sedentary lifestyle or immobility, inadequate nutrition, moisture, friction, shear, incontinence, poor hygiene, age, or conditions that affect blood flow to the extremities. Instruct patient/caregiver on prevention strategies such as: frequent position changes (at least every 2 hours), proper positioning or use of pressure relieving devices to alleviate pressure points, maintenance of hygiene, checking skin regularly, and following your physician-recommended diet.    -SN reviewed that hypertension is high blood pressure. Blood pressure is the force of blood moving against the walls of the arteries. The blood pressure reading is reported in 2 numbers. The top number, called systole is the pressure inside the arteries when the heart is contracting, and the bottom number called the diastole, is the pressure inside the arteries when the heart is relaxed. HTN causes the blood pressure to get so high that the heart has to work harder than normal. This can damage the heart. The cause of hypertension may not be known. This is called essential or primary hypertension. Hypertension caused by another medical condition, such as kidney disease, is called secondary hypertension. There may be no signs and symptoms of hypertension or may include headache, blurred vision, chest pain, nose bleeds, dizziness, weakness, or trouble breathing. Hypertension is diagnosed after taking blood  pressure readings at several doctor visits. If left untreated, hypertension increases the risk of heart attack, stroke, and kidney disease.    -SN instruct pt/CG in techniques to avoid risks while taking hypoglycemic medication(s) such as: adhering to the schedule of testing blood sugars or other labs for therapeutic levels as instructed by MD, avoiding alcohol while taking these meds,  follow a diet/exercise plan as instructed by MD, staying hydrated. For a missed dose, take it as soon as pt remembers, if it is almost time for the next dose, wait until and take the regular dose. Do not take extra medicine to make up for a missed dose.    -SN instructed pt/CG in care of feet including: wash feet with soap and water daily, wear clean socks daily, shoes that fit properly, checking daily for cracks, sores or cuts, washing daily with warm water and mild soap but do not soak feet, put lotion on feet every night but not between toes, wear comfortable shoes not tight or worn out, never go barefoot, do not use sharp tools on feet, file nails straight across, do not use hot water bottles/heating pads on feet/legs, test water temperature with your elbow, do not smoke, have podiatrist care for your feet regularly, have physician look at feet at each appointment.    Sharps education provided: Containers should be made of hard plastic, be puncture-resistant and leakproof, such as a Pharmacologist  detergent or bleach bottle.  When the container is  full, it should be sealed with tape and labeled DO NOT RECYCLE prior to discarding in the regular trash. Patient level of understanding of education provided: Pt verbalized all understanding to education provided    Pt acknowledged understanding of all education provided, some written information also provided for pt to keep and review.    Agency Progress toward goals: Progressing toward established interventions     Patient's Progress towards personal goals: when patient reaches goals and  medication is managed, disease processes are understood patient agrees and understand that discharge will take place.

## 2022-02-11 NOTE — Case Communication (Signed)
Patient admitted to [facility] services on 02/09/22 for wound care.  Will Bonnet, DO notified of patient admission to home health and plan of care including anticipated frequency of 3w3 2w3 1w3 3 prn and treatments/interventions/modalities of wound care.  Medications reconciled and all medications are not available in the home this visit.    pt needing to get aspirin, iron. pt does not take probiotic, pt completed zosyn. pt unsure of norvas c- reporting she does not currently take and was not on discharge list.  No severe medication interactions noted on admission visit.  wound care performed per orders on referral to coccyx- old dressing completely removed, wound cleansed with saline and applied wet to dry (ordering supplies/dakins), secured with foam dressing. See wound addendum for description. SN unable to get a proper photo due to patient inability to turn well enough  for SN to take a photo.  Home health supplies by type and quantity ordered/delivered this visit include: sn ordered supplies, pt needs dakins from office.  Please call [phone] with any questions. Thank you!  Benson Setting, RN, BSN

## 2022-02-12 ENCOUNTER — Encounter: Payer: PRIVATE HEALTH INSURANCE | Primary: Physician Assistant

## 2022-02-12 ENCOUNTER — Ambulatory Visit
Admit: 2022-02-12 | Discharge: 2022-02-12 | Payer: PRIVATE HEALTH INSURANCE | Attending: Surgery | Primary: Physician Assistant

## 2022-02-12 DIAGNOSIS — L89154 Pressure ulcer of sacral region, stage 4: Secondary | ICD-10-CM

## 2022-02-12 MED ORDER — HYDROCODONE-ACETAMINOPHEN 5-325 MG PO TABS
5-325 MG | ORAL_TABLET | ORAL | 0 refills | Status: AC | PRN
Start: 2022-02-12 — End: 2022-02-15

## 2022-02-12 MED ORDER — COLLAGENASE 250 UNIT/GM EX OINT
250 UNIT/GM | CUTANEOUS | 1 refills | Status: AC
Start: 2022-02-12 — End: 2022-02-22

## 2022-02-12 NOTE — Progress Notes (Signed)
[name] Short is a 66 y.o. female  Chief Complaint   Patient presents with    Post-Op Check     Sacral wound I&D     [name] Short has been given the following recommendations today due to her elevated BP reading referred to Alternative/PCP Patient did not take BP meds today.  1. Have you been to the ER, urgent care clinic since your last visit?  Hospitalized since your last visit?No    2. Have you seen or consulted any other health care providers outside of the [facility] since your last visit?  Include any pap smears or colon screening. No

## 2022-02-12 NOTE — Telephone Encounter (Signed)
Notified [place] home health of referral for wound vac

## 2022-02-13 ENCOUNTER — Encounter: Payer: PRIVATE HEALTH INSURANCE | Primary: Physician Assistant

## 2022-02-13 ENCOUNTER — Other Ambulatory Visit: Primary: Physician Assistant

## 2022-02-13 ENCOUNTER — Inpatient Hospital Stay: Admit: 2022-02-13 | Payer: PRIVATE HEALTH INSURANCE | Primary: Physician Assistant

## 2022-02-13 ENCOUNTER — Encounter

## 2022-02-13 DIAGNOSIS — Z981 Arthrodesis status: Secondary | ICD-10-CM

## 2022-02-13 NOTE — Progress Notes (Signed)
CC:   Chief Complaint   Patient presents with    Post-Op Check     Sacral wound I&D        Assessment:    ICD-10-CM    1. Sacral decubitus ulcer, stage IV (HCC)  L89.154 HYDROcodone-acetaminophen (NORCO) 5-325 MG per tablet     External Referral To Home Health          Plan: Today the wound measured 6.0x7.0 with 5.0 cm depth. Recommend applying small amount of santyl to the bed of the wound and then placing wound vac with black foam, -175mHG continuous changes MWF. Follow up in 2 months or sooner should she have any questions or concerns. She agrees with this plan.         HPI:  [name] Raczynskiis a 66y.o. female who is here today for her initial follow up from surgery on 12/28/2021 for sacral wound debridement. She is here today with her daughter who helps care for her sacral wound. No fevers or chills.     Allergies:  Allergies   Allergen Reactions    Lactose Other (See Comments)     Intolerance       Medication Review:  Current Outpatient Medications on File Prior to Visit   Medication Sig Dispense Refill    Baclofen 5 MG PACK Take 1 tablet by mouth every 12 hours as needed (muscle spasms).      oxyCODONE (ROXICODONE) 5 MG immediate release tablet Take 1 tablet by mouth every 6 hours as needed for Pain.      acetaminophen (TYLENOL) 500 MG tablet Take 1 tablet by mouth every 6 hours as needed for Fever or Pain 30 tablet 0    insulin glargine (LANTUS) 100 UNIT/ML injection vial Inject 34 Units into the skin daily 10 mL 0    insulin lispro (HUMALOG) 100 UNIT/ML SOLN injection vial Inject 4 Units into the skin 3 times daily (with meals) 1 each 0    hydrALAZINE (APRESOLINE) 10 MG tablet Take 1 tablet by mouth every 6 hours as needed (As needed for systolic blood pressure greater than 160) 90 tablet 0    amLODIPine (NORVASC) 5 MG tablet Take 1 tablet by mouth daily 30 tablet 0    polyethylene glycol (GLYCOLAX) 17 g packet Take 17 g by mouth daily as needed for Constipation 527 g 0    aspirin 81 MG chewable tablet Take 1  tablet by mouth daily 30 tablet 3    atorvastatin (LIPITOR) 80 MG tablet Take 1 tablet by mouth nightly 30 tablet 3    ferrous sulfate (IRON 325) 325 (65 Fe) MG tablet Take 1 tablet by mouth daily 30 tablet     Multiple Vitamins-Minerals (THERAPEUTIC MULTIVITAMIN-MINERALS) tablet Take 1 tablet by mouth daily      furosemide (LASIX) 40 MG tablet furosemide 40 mg tablet   TAKE 1 TABLET BY MOUTH EVERY DAY      insulin lispro (HUMALOG) 100 UNIT/ML SOLN injection vial Sliding Scale insulin as per Institutional protocol (Patient not taking: Reported on 02/11/2022) 1 each 0    lactobacillus delayed release capsule Take 1 capsule by mouth daily (with breakfast) (Patient not taking: Reported on 02/12/2022) 15 capsule 0    piperacillin-tazobactam (ZOSYN) infusion Compound per protocol.  Continue IV Zosyn per pharmacy dosing through February 06, 2022 (Patient not taking: Reported on 02/11/2022) 304 g 1     No current facility-administered medications on file prior to visit.       Systems  Review:  Review of Systems   Constitutional:  Negative for fever and unexpected weight change.     PMH:  Past Medical History:   Diagnosis Date    Anemia     Arthritis     Chronic pain     Legs and Shoulders    Diabetes ([place])     Exposure to asbestos     History of blood transfusion     History of colon polyps     HTN (hypertension)     Hx of blood clots     DVT    Hyperlipemia     Menopause     Pulmonary nodule     Serum calcium elevated     Sleep apnea     not using cpap    Stroke [facility])        Surgical History:  Past Surgical History:   Procedure Laterality Date    CESAREAN SECTION  1987    COLONOSCOPY N/A 01/13/2017    COLONOSCOPY performed by Joycelyn Schmid, MD at [place] N/A 12/28/2021    SACRAL WOUND DEBRIDEMENT INCISION AND DRAINAGE performed by Nestor Ramp, DO at [place] N/A 12/09/2021    CERVICAL THREE/FOUR/FIVE/SIX LAMINECTOMY FUSION;  C-ARM; STRYKER; EXT BONE GROWTH STIM; 23 HR performed by Candi Leash, MD at [facility] MAIN OR       Social History:  Social History     Socioeconomic History    Marital status: Single     Spouse name: None    Number of children: None    Years of education: None    Highest education level: None   Tobacco Use    Smoking status: Former     Packs/day: 1.00     Types: Cigarettes, Cigars     Quit date: 11/13/2021     Years since quitting: 0.2    Smokeless tobacco: Never   Vaping Use    Vaping Use: Never used   Substance and Sexual Activity    Alcohol use: Not Currently     Comment: occ    Drug use: Never     Social Determinants of Health     Financial Resource Strain: Low Risk     Difficulty of Paying Living Expenses: Not hard at all   Food Insecurity: No Food Insecurity    Worried About Charity fundraiser in the Last Year: Never true    Ran Out of Food in the Last Year: Never true   Transportation Needs: No Transportation Needs    Lack of Transportation (Medical): No    Lack of Transportation (Non-Medical): No   Physical Activity: Inactive    Days of Exercise per Week: 0 days    Minutes of Exercise per Session: 0 min   Stress: No Stress Concern Present    Feeling of Stress : Not at all   Social Connections: Moderately Isolated    Frequency of Communication with Friends and Family: Three times a week    Frequency of Social Gatherings with Friends and Family: More than three times a week    Attends Religious Services: More than 4 times per year    Active Member of Clubs or Organizations: No    Attends Archivist Meetings: Never    Marital Status: Widowed   Intimate Partner Violence: Not At Risk  Fear of Current or Ex-Partner: No    Emotionally Abused: No    Physically Abused: No    Sexually Abused: No   Housing Stability: Low Risk     Unable to Pay for Housing in the Last Year: No    Number of Places Lived in the Last Year: 1    Unstable Housing in the Last Year: No       Family History:  Family History   Problem  Relation Age of Onset    Diabetes Other 66        parent, NOS    Cancer Father     Hypertension Other 35        parent,NOS    Heart Disease Other 29        parent, NOS    Lung Disease Father        Hospital Outpatient Visit on 02/03/2022   Component Date Value Ref Range Status    Potassium 02/02/2022 3.8  3.5 - 5.1 mEq/L Final    Chloride 02/02/2022 112 (H)  98 - 107 mEq/L Final    Sodium 02/02/2022 146 (H)  136 - 145 mEq/L Final    CO2 02/02/2022 25  20 - 31 mEq/L Final    Glucose 02/02/2022 206 (H)  74 - 106 mg/dl Final    BUN 02/02/2022 20  9 - 23 mg/dl Final    Creatinine 02/02/2022 0.87  0.55 - 1.02 mg/dl Final    GFR African American 02/02/2022 >60.0    Final    Comment: THE [facility] [employer] STATES THAT THE MDRD STUDY EQUATION SHOULD ONLY BE USED ON  INDIVIDUALS 18 OR OLDER. THE REPORT ALSO NOTES THAT THE MDRD STUDY EQUATION HAS NOT BEEN  VALIDATED FOR USE WITH THE ELDERLY (OVER 23 YEARS OF AGE), PREGNANT WOMEN, PATIENTS WITH SERIOUS  COMORBID CONDITIONS, OR PERSONS WITH EXTREMES OF BODY SIZE, MUSCLE MASS, OR NUTRITIONAL STATUS.  APPLICATION OF THE EQUATION TO THESE PATIENT GROUPS MAY LEAD TO ERRORS IN GFR ESTIMATION. GFR  ESTIMATING EQUATIONS HAVE POORER AGREEMENT WITH MEASURED GFR FOR ILL HOSPITALIZED PATIENTS AND  FOR PEOPLE WITH NEAR NORMAL KIDNEY FUNCTION THAN FOR SUBJECTS IN THE MDRD STUDY. VALIDATION  STUDIES ARE IN PROGRESS TO EVALUATE THE MDRD STUDY EQUATION FOR ADDITIONAL ETHNIC GROUPS, THE  ELDERLY, VARIOUS DISEASE CONDITIONS, AND PEOPLE WITH NORMAL KIDNEY FUNCTION.    GFRA----REFERS TO AFRICAN AMERICAN  GFRO---REFERS TO OTHER RACES    REFERENCES AVAILABLE UPON REQUEST.        GFR Non-African American 02/02/2022 >60    Final    Calcium 02/02/2022 10.5 (H)  8.7 - 10.4 mg/dl Final    Anion Gap 02/02/2022 9  5 - 15 mmol/L Final    AST 02/02/2022 34.0 (H)  0.0 - 33.9 U/L Final    ALT 02/02/2022 37  10 - 49 U/L Final    Alkaline Phosphatase 02/02/2022 139 (H)  46 - 116 U/L Final    Total  Bilirubin 02/02/2022 0.20 (L)  0.30 - 1.20 mg/dl Final    Total Protein 02/02/2022 6.0  5.7 - 8.2 gm/dl Final    Albumin 02/02/2022 2.9 (L)  3.4 - 5.0 gm/dl Final   No results displayed because visit has over 200 results.      No results displayed because visit has over 200 results.      Admission on 12/09/2021, Discharged on 12/11/2021   Component Date Value Ref Range Status    POC Glucose 12/09/2021 204 (H)  70 -  110 mg/dL Final    Comment: (NOTE)  The FDA has indicated that no capillary point of care blood glucose   monitoring systems are approved for use in "critically ill" patients,   however they have not defined this population. The College of   American Pathologists has recommended that these devices should not   be used in cases such as severe hypotension, dehydration, shock, and   hyperglycemic-hyperosmolar state, amongst others.  Venous or arterial   collection is the recommended specimen for testing these patients.      POC Glucose 12/09/2021 167 (H)  70 - 110 mg/dL Final    Comment: (NOTE)  The FDA has indicated that no capillary point of care blood glucose   monitoring systems are approved for use in "critically ill" patients,   however they have not defined this population. The College of   American Pathologists has recommended that these devices should not   be used in cases such as severe hypotension, dehydration, shock, and   hyperglycemic-hyperosmolar state, amongst others.  Venous or arterial   collection is the recommended specimen for testing these patients.      Hemoglobin A1C 12/09/2021 7.5 (H)  4.2 - 5.6 % Final    Comment: (NOTE)  HbA1C Interpretive Ranges  <5.7              Normal  5.7 - 6.4         Consider Prediabetes  >6.5              Consider Diabetes      eAG 12/09/2021 169  mg/dL Final    Comment: (NOTE)  The eAG should be interpreted with patient characteristics in mind   since ethnicity, interindividual differences, red cell lifespan,   variation in rates of glycation, etc. may  affect the validity of the   calculation.      POC Glucose 12/09/2021 239 (H)  70 - 110 mg/dL Final    Comment: (NOTE)  The FDA has indicated that no capillary point of care blood glucose   monitoring systems are approved for use in "critically ill" patients,   however they have not defined this population. The College of   American Pathologists has recommended that these devices should not   be used in cases such as severe hypotension, dehydration, shock, and   hyperglycemic-hyperosmolar state, amongst others.  Venous or arterial   collection is the recommended specimen for testing these patients.      Sodium 12/10/2021 135 (L)  136 - 145 mmol/L Final    Potassium 12/10/2021 5.3  3.5 - 5.5 mmol/L Final    Chloride 12/10/2021 107  100 - 111 mmol/L Final    CO2 12/10/2021 25  21 - 32 mmol/L Final    Anion Gap 12/10/2021 3  3.0 - 18 mmol/L Final    Glucose 12/10/2021 310 (H)  74 - 99 mg/dL Final    BUN 12/10/2021 31 (H)  7.0 - 18 MG/DL Final    Creatinine 12/10/2021 1.31 (H)  0.6 - 1.3 MG/DL Final    Bun/Cre Ratio 12/10/2021 24 (H)  12 - 20   Final    Est, Glom Filt Rate 12/10/2021 46 (L)  >60 ml/min/1.61m Final    Comment:      Pediatric calculator link: https://www.kidney.org/professionals/kdoqi/gfr_calculatorped       These results are not intended for use in patients <135years of age.       eGFR results are calculated without a race factor  using  the 2021 CKD-EPI equation. Careful clinical correlation is recommended, particularly when comparing to results calculated using previous equations.  The CKD-EPI equation is less accurate in patients with extremes of muscle mass, extra-renal metabolism of creatinine, excessive creatine ingestion, or following therapy that affects renal tubular secretion.      Calcium 12/10/2021 10.5 (H)  8.5 - 10.1 MG/DL Final    WBC 12/10/2021 9.4  4.6 - 13.2 K/uL Final    RBC 12/10/2021 4.03 (L)  4.20 - 5.30 M/uL Final    Hemoglobin 12/10/2021 10.2 (L)  12.0 - 16.0 g/dL Final     Hematocrit 12/10/2021 32.1 (L)  35.0 - 45.0 % Final    MCV 12/10/2021 79.7  78.0 - 100.0 FL Final    MCH 12/10/2021 25.3  24.0 - 34.0 PG Final    MCHC 12/10/2021 31.8  31.0 - 37.0 g/dL Final    RDW 12/10/2021 15.3 (H)  11.6 - 14.5 % Final    Platelets 12/10/2021 195  135 - 420 K/uL Final    MPV 12/10/2021 10.7  9.2 - 11.8 FL Final    Nucleated RBCs 12/10/2021 0.0  0 PER 100 WBC Final    nRBC 12/10/2021 0.00  0.00 - 0.01 K/uL Final    POC Glucose 12/09/2021 295 (H)  70 - 110 mg/dL Final    Comment: Notified RN or MD immediately by operator  (NOTE)  The FDA has indicated that no capillary point of care blood glucose   monitoring systems are approved for use in "critically ill" patients,   however they have not defined this population. The College of   American Pathologists has recommended that these devices should not   be used in cases such as severe hypotension, dehydration, shock, and   hyperglycemic-hyperosmolar state, amongst others.  Venous or arterial   collection is the recommended specimen for testing these patients.      POC Glucose 12/10/2021 357 (H)  70 - 110 mg/dL Final    Comment: Notified RN or MD immediately by operator  (NOTE)  The FDA has indicated that no capillary point of care blood glucose   monitoring systems are approved for use in "critically ill" patients,   however they have not defined this population. The College of   American Pathologists has recommended that these devices should not   be used in cases such as severe hypotension, dehydration, shock, and   hyperglycemic-hyperosmolar state, amongst others.  Venous or arterial   collection is the recommended specimen for testing these patients.      POC Glucose 12/10/2021 365 (H)  70 - 110 mg/dL Final    Comment: (NOTE)  The FDA has indicated that no capillary point of care blood glucose   monitoring systems are approved for use in "critically ill" patients,   however they have not defined this population. The College of   American  Pathologists has recommended that these devices should not   be used in cases such as severe hypotension, dehydration, shock, and   hyperglycemic-hyperosmolar state, amongst others.  Venous or arterial   collection is the recommended specimen for testing these patients.      POC Glucose 12/10/2021 400 (H)  70 - 110 mg/dL Final    Comment: (NOTE)  The FDA has indicated that no capillary point of care blood glucose   monitoring systems are approved for use in "critically ill" patients,   however they have not defined this population. The [employer] of   American Pathologists  has recommended that these devices should not   be used in cases such as severe hypotension, dehydration, shock, and   hyperglycemic-hyperosmolar state, amongst others.  Venous or arterial   collection is the recommended specimen for testing these patients.      POC Glucose 12/10/2021 364 (H)  70 - 110 mg/dL Final    Comment: (NOTE)  The FDA has indicated that no capillary point of care blood glucose   monitoring systems are approved for use in "critically ill" patients,   however they have not defined this population. The College of   American Pathologists has recommended that these devices should not   be used in cases such as severe hypotension, dehydration, shock, and   hyperglycemic-hyperosmolar state, amongst others.  Venous or arterial   collection is the recommended specimen for testing these patients.      POC Glucose 12/11/2021 222 (H)  70 - 110 mg/dL Final    Comment: (NOTE)  The FDA has indicated that no capillary point of care blood glucose   monitoring systems are approved for use in "critically ill" patients,   however they have not defined this population. The College of   American Pathologists has recommended that these devices should not   be used in cases such as severe hypotension, dehydration, shock, and   hyperglycemic-hyperosmolar state, amongst others.  Venous or arterial   collection is the recommended specimen for testing  these patients.      POC Glucose 12/11/2021 328 (H)  70 - 110 mg/dL Final    Comment: (NOTE)  The FDA has indicated that no capillary point of care blood glucose   monitoring systems are approved for use in "critically ill" patients,   however they have not defined this population. The College of   American Pathologists has recommended that these devices should not   be used in cases such as severe hypotension, dehydration, shock, and   hyperglycemic-hyperosmolar state, amongst others.  Venous or arterial   collection is the recommended specimen for testing these patients.         MRA NECK WO CONTRAST  Narrative: MRA OF THE NECK WITHOUT CONTRAST    HISTORY: Left thalamic infarct, weakness    COMPARISON: Concurrent brain MRA    TECHNIQUE: Noncontrast scanning of the neck arteries focused at the carotid  artery bifurcations was accomplished with 2D TOF technique.  Source images of  the arteries from the aorta up to the skull base processed with MIP algorithm.    CONTRAST: None.    FINDINGS:   Right carotid: Patent.   Left carotid: Patent.   Suboptimal evaluation of stenoses secondary to lack of intravenous contrast  however no limiting stenosis identified.    Right vertebral: Patent.   Left vertebral: Occluded.   Impression: 1.  Occluded cervical left vertebral artery.  2.  Otherwise patent right vertebral artery and bilateral carotid arteries.  3.  Suboptimal evaluation of stenoses secondary to lack of intravenous contrast  however no limiting stenosis identified of the ICA origins identified.  MRA HEAD WO CONTRAST  Narrative: MRA WITHOUT CONTRAST    HISTORY: Left colonic infarct, weakness    COMPARISON: Concurrent brain MRI    TECHNIQUE: 3-D time of flight MR angiography of the intracranial arteries was  obtained and portrayed in raw data and maximum intensity projection formats.    FINDINGS:    ANTERIOR CIRCULATION:   Internal carotid: Moderate narrowing of the distal left cavernous ICA otherwise  patent right  ICA.  Anterior cerebral: Bilateral A1 and proximal A2 segments are within normal  limits.   Anterior communicating: Nonvisualized  Middle cerebral: The bilateral M1 and M2 segments are within normal limits.  Posterior communicating:  The bilateral posterior communicating arteries are  within normal limits.    Posterior circulation:   Vertebral: Right vertebral artery is patent with moderate long segment narrowing  of the distal V4 segment. Left vertebral artery is occluded intradurally minimal  distal reconstitution, possibly retrograde flow.  Basilar: Diminutive basilar artery.  Superior cerebellar: Origins are patent bilaterally.  Posterior cerebral: Dominant supply bilateral posterior communicating arteries  bilaterally with diminutive P1 segments. Tapering of the distal P2 segment on  the right.  Impression: 1.  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  2.  Narrowing of the distal right vertebral artery.  3.  Moderate stenosis left cavernous ICA.  MRI BRAIN WO CONTRAST  Narrative: MR BRAIN WITHOUT CONTRAST    HISTORY: Age-indeterminate left thalamic lucency on head CT from 12/27/2021,  possible infarct. Right weakness    COMPARISON: CT 12/27/2021    TECHNIQUE: Axial and sagittal T1W scans, axial T2W scans, axial FLAIR, Swan, and  axial diffusion weighted images.    FINDINGS:     Brain parenchyma: Small acute infarct in left thalamus corresponding to CT  abnormality. No evidence of hemorrhagic transformation. Multiple chronic  infarcts bilateral pons and small infarct left cerebellum. Mild periventricular  and deep white matter FLAIR hyperintensity suggestive chronic microvascular  ischemic disease.    Extra-axial spaces, ventricles, basal cisterns: No extra-axial fluid collections  are identified. Mild prominence of ventricles suggestive of central greater than  cortical volume loss. Basal cisterns are patent.     Orbits and  paranasal sinuses: Orbits are within normal limits for technique.  Included paranasal sinuses and mastoid air cells are clear.    Calvarium and skull base: Calvarium is normal limits. The sella turcica is  within normal limits.    Visualized upper cervical spine: Postsurgical changes to the visualized upper  cervical spine incompletely evaluated.   Impression: Small acute infarct in the left thalamus corresponding to lucency on recent head  CT.    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    White matter changes suggestive of mild chronic microvascular ischemic disease.    Concordant preliminary report provided 12/30/2021 at 1824.         Physical Exam:  BP (!) 172/84 (Site: Left Wrist, Position: Sitting) Comment (Cuff Size): wrist cuff  Pulse 84   Temp 97.3 F (36.3 C) (Temporal)   Resp 18   Ht 4' 11"  (1.499 m)   Wt 159 lb (72.1 kg)   SpO2 100%   BMI 32.11 kg/m  BMI: Body mass index is 32.11 kg/m.    Physical Exam  Skin:     General: Skin is warm and dry.      Comments: Small area slough bed of wound, no periwound erythema, serous drainage, minimal, non tender       I have reviewed the information entered by the clinical staff and/or patient and verified it as accurate or edited where necessary.     Electronically signed by:    Nestor Ramp, DO, MPH

## 2022-02-14 ENCOUNTER — Encounter: Admit: 2022-02-14 | Discharge: 2022-02-14 | Payer: PRIVATE HEALTH INSURANCE | Primary: Physician Assistant

## 2022-02-14 ENCOUNTER — Other Ambulatory Visit: Payer: Self-pay | Admitting: Student

## 2022-02-14 DIAGNOSIS — I5042 Chronic combined systolic (congestive) and diastolic (congestive) heart failure: Secondary | ICD-10-CM

## 2022-02-14 NOTE — Home Health (Addendum)
 SUBJECTIVE: Pt reports she is tired after using BSC and c/o fatigue.  CAREGIVER INVOLVEMENT/ASSISTANCE NEEDED FOR: pts dtr assist pt with all needs prn and in home during PT session.  .  OBJECTIVE: See interventions.  PATIENT EDUCATION PROVIDED THIS VISIT: Pt to continue HEP 2x/day to BLEs in sitting and amb in home with FWW and assist every 2 waking hours.  PATIENT RESPONSE TO EDUCATION PROVIDED: Pt demonstrates a good understanding of HEP and reports a good copliance.  Pt reports she stands/transfers throughout they day   PATIENT RESPONSE TO TREATMENT: Pt declined amb in home and requires frequent rest breaks throughout PT session secondary to c/o weakness/pain in knees.    .  ASSESSMENT OF PROGRESS TOWARD GOALS:Pt had bathroom issues at beginning of PT session and reports fatigue afterwards. Pt declined amb today secondary to c/o pain in knees and fatigue.  Pt did agree to perform sitting HEP and requires assist with sit <> stand and SPT BSC to/from couch with UE support, extra time/effort and CGA for safety.  Pt requires cuing for safe walker placement.  Aaron Aas  PLAN FOR NEXT VISIT: Will plan to attempt stair amb next visit to safely enter/exit home for appts.  THE FOLLOWING DISCHARGE PLANNING WAS DISCUSSED WITH THE PATIENT/CAREGIVER:Pt is scheduled to continue 2w5, 1w1.

## 2022-02-17 ENCOUNTER — Encounter: Admit: 2022-02-17 | Discharge: 2022-02-17 | Payer: PRIVATE HEALTH INSURANCE | Primary: Physician Assistant

## 2022-02-17 NOTE — Home Health (Signed)
Skilled reason for visit: Wound care    Caregiver involvement: Pt resides in single story home with her daughter, amb with use of a walker, daughter is pts CG and assts with ADL's, medication, appts and transportation and is available 24/7 to asst as needed.    Home health supplies by type and quantity ordered/delivered this visit include: N/A    Medications reviewed and all medications are available in the home this visit.    The following education was provided regarding medications: SN Instructed pt and CG not to stop, start or change the way she is currently taking any medications without consulting with the MD    - SN reminded pt/CG on opioid/narcotic purpose, dose, appearance, preparation, and scheduling; signs/symptoms for which to notify home health/physician such as: dizziness, sleepiness, constipation, nausea, vomiting, itchiness, dry mouth. Instruct patient/caregiver on signs and symptoms of overdose for which to call emergency services such as: seizures, inability to be woken, trouble staying awake, slow or shallow breathing, slurred speech, blue lips or fingers, confusion, inability to urinate, or inability to stop vomiting. Instructed pt on methods to mitigate risk such as: taking medications as prescribed, keeping a schedule of medications, not taking opioids that are not prescribed to pt, avoiding alcohol, eating high fiber foods, staying hydrated, and exercising as tolerated.    MD notified of any discrepancies/look a-like medications/medication interactions: NA    Medications are effective at this time.     Patient education provided this visit: SEE INTERVENTIONS    -SN instructed pt/CG to notify home health or physician for the following wound healing complications: increased drainage, pus or a foul odor coming from the wound, fever, muscle, joint, or body aches, sweating, increased swelling, redness or red streaks surrounding the wound; dramatic change in color or size of wound; stitches coming  apart or wound reopening; increase in pain at wound site in spite of interventions.    -SN instructed pt/CG on importance of adequate nutrition and hydration for wound healing. Healthy foods give your body the nutrients it needs to heal wounds. Protein foods like meat, fish, nuts, and soy products are important to wound healing. In addition to protein, calories, vitamin C, and zinc help wounds heal. Liquids prevent dehydration that can decrease the blood supply to wounds. Always follow physician recommended diet in regards to patient condition. Instruct on other factors that affect wound healing such as: maintaining general hygiene, not smoking or using tobacco products, offloading pressure and pressure relieving devices, and management of blood sugars.    -SN instructed pt/CG that a CVA/stroke is the term used when a part of the brain is damaged because of a problem with blood flow. Risk factors for stroke include: older age, family history of stroke, diabetes, kidney disease, high cholesterol, plaque buildup in arteries, atrial fibrillation, high blood pressure, smoking cigarettes, drinking too much alcohol, or using illicit drugs, not enough physical activity, obesity, or the use of hormone replacement therapies (in women). Ischemic strokes happen when an artery going to the brain gets clogged or closes off, and part of the brain goes without blood for too long. Treatment for ischemic strokes focuses on reopening clogged arteries. This can be achieved through the use of 'clot-busting' medications and additional medications that help prevent new clots from forming.    -SN instructed pt/CG that symptoms of stroke usually come on suddenly. Signs/symptoms to watch for can easily be recalled using the phrase "BE FAST" each letter in the word stands for one of the  things you should watch for and what to do about it:  Balance - does the person have a sudden loss of balance?  Eyes - Has the person lost vision in one or  both eyes?  Face - Does the person's face look uneven or droop on one side?   Arm - Does the person have weakness or numbness in one or both arms? Does one arm drift down if the person tries to hold both arms out?   Speech - Is the person having trouble speaking? Does his or her speech sound strange?   Time - If you notice any of these stroke signs, call emergency services. You need to act FAST. The sooner treatment begins, the better the chances of recovery.    -SN instructed pt/CG on risks for pressure injury such as: sedentary lifestyle or immobility, inadequate nutrition, moisture, friction, shear, incontinence, poor hygiene, age, or conditions that affect blood flow to the extremities. Instruct patient/caregiver on prevention strategies such as: frequent position changes (at least every 2 hours), proper positioning or use of pressure relieving devices to alleviate pressure points, maintenance of hygiene, checking skin regularly, and following your physician-recommended diet.    -SN reviewed that hypertension is high blood pressure. Blood pressure is the force of blood moving against the walls of the arteries. The blood pressure reading is reported in 2 numbers. The top number, called systole is the pressure inside the arteries when the heart is contracting, and the bottom number called the diastole, is the pressure inside the arteries when the heart is relaxed. HTN causes the blood pressure to get so high that the heart has to work harder than normal. This can damage the heart. The cause of hypertension may not be known. This is called essential or primary hypertension. Hypertension caused by another medical condition, such as kidney disease, is called secondary hypertension. There may be no signs and symptoms of hypertension or may include headache, blurred vision, chest pain, nose bleeds, dizziness, weakness, or trouble breathing. Hypertension is diagnosed after taking blood pressure readings at several  doctor visits. If left untreated, hypertension increases the risk of heart attack, stroke, and kidney disease.    -SN instruct pt/CG in techniques to avoid risks while taking hypoglycemic medication(s) such as: adhering to the schedule of testing blood sugars or other labs for therapeutic levels as instructed by MD, avoiding alcohol while taking these meds,  follow a diet/exercise plan as instructed by MD, staying hydrated. For a missed dose, take it as soon as pt remembers, if it is almost time for the next dose, wait until and take the regular dose. Do not take extra medicine to make up for a missed dose.    -SN instructed pt/CG in care of feet including: wash feet with soap and water daily, wear clean socks daily, shoes that fit properly, checking daily for cracks, sores or cuts, washing daily with warm water and mild soap but do not soak feet, put lotion on feet every night but not between toes, wear comfortable shoes not tight or worn out, never go barefoot, do not use sharp tools on feet, file nails straight across, do not use hot water bottles/heating pads on feet/legs, test water temperature with your elbow, do not smoke, have podiatrist care for your feet regularly, have physician look at feet at each appointment.    Sharps education provided: Containers should be made of hard plastic, be puncture-resistant and leakproof, such as a laundry detergent or bleach bottle.  When the container is  full, it should be sealed with tape and labeled DO NOT RECYCLE prior to discarding in the regular trash. Patient level of understanding of education provided: Pt verbalized all understanding to education provided    Pt acknowledged understanding of all education provided, some written information also provided for pt to keep and review.    Agency Progress toward goals: Progressing toward established interventions     Patient's Progress towards personal goals: when patient reaches goals and medication is managed, disease  processes are understood patient agrees and understand that discharge will take place.

## 2022-02-18 ENCOUNTER — Encounter: Admit: 2022-02-18 | Discharge: 2022-02-18 | Payer: PRIVATE HEALTH INSURANCE | Primary: Physician Assistant

## 2022-02-18 NOTE — Home Health (Signed)
 SUBJECTIVE: Pt reports she is very tired and did not feel like she could get up/down steps today.  CAREGIVER INVOLVEMENT/ASSISTANCE NEEDED FOR: pts dtr present during PT session and assist pt with all needs prn.  .  OBJECTIVE: See interventions.  PATIENT EDUCATION PROVIDED THIS VISIT: Pt instructed to continue HEP 2x/day and amb with FWW in home every 1-2 hrs with assist and/or stand every hour with walker.  Recommend pt having BSC in kitchen for pt to have to amb to/from [facility] to increase amb tolerance.  PATIENT RESPONSE TO EDUCATION PROVIDED: Pt reports a fair compliance with HEP and does not amb or stand often during the day except to use BSC.  PATIENT RESPONSE TO TREATMENT: Pt attempted to amb up/down steps but pt unable to step down top step secondary to c/o weakness and fatigue in LEs.  Pt did amb through many obstacles to amb to/from front door.  .  ASSESSMENT OF PROGRESS TOWARD GOALS: Pt attempted stair amb today but unable secondary to c/o weakness in LEs. Reviewed with pt importance of increasing standing/amb attempts during the day with assist to increase standing tolerance in prep for stair amb.  Aaron Aas  PLAN FOR NEXT VISIT: Will plan to attempt stair amb again next visit.  THE FOLLOWING DISCHARGE PLANNING WAS DISCUSSED WITH THE PATIENT/CAREGIVER: Pt is scheduled to continue PT 1 more visit this week then 2w4, 1w1.

## 2022-02-19 ENCOUNTER — Encounter: Admit: 2022-02-19 | Discharge: 2022-02-19 | Payer: PRIVATE HEALTH INSURANCE | Primary: Physician Assistant

## 2022-02-19 NOTE — Home Health (Signed)
Skilled reason for visit: Wound care    Caregiver involvement: Pt resides in single story home with her daughter, amb with use of a walker, daughter is pts CG and assts with ADL's, medication, appts and transportation and is available 24/7 to asst as needed.    Home health supplies by type and quantity ordered/delivered this visit include: SN placed an order for supplies to be delivered to pts home.    Medications reviewed and all medications are available in the home this visit.    The following education was provided regarding medications: SN Instructed pt and CG not to stop, start or change the way she is currently taking any medications without consulting with the MD    - SN reminded pt/CG on opioid/narcotic purpose, dose, appearance, preparation, and scheduling; signs/symptoms for which to notify home health/physician such as: dizziness, sleepiness, constipation, nausea, vomiting, itchiness, dry mouth. Instruct patient/caregiver on signs and symptoms of overdose for which to call emergency services such as: seizures, inability to be woken, trouble staying awake, slow or shallow breathing, slurred speech, blue lips or fingers, confusion, inability to urinate, or inability to stop vomiting. Instructed pt on methods to mitigate risk such as: taking medications as prescribed, keeping a schedule of medications, not taking opioids that are not prescribed to pt, avoiding alcohol, eating high fiber foods, staying hydrated, and exercising as tolerated.    MD notified of any discrepancies/look a-like medications/medication interactions: NA    Medications are effective at this time.     Patient education provided this visit: SEE INTERVENTIONS    -SN instructed pt/CG to notify home health or physician for the following wound healing complications: increased drainage, pus or a foul odor coming from the wound, fever, muscle, joint, or body aches, sweating, increased swelling, redness or red streaks surrounding the wound;  dramatic change in color or size of wound; stitches coming apart or wound reopening; increase in pain at wound site in spite of interventions.    -SN instructed pt/CG on importance of adequate nutrition and hydration for wound healing. Healthy foods give your body the nutrients it needs to heal wounds. Protein foods like meat, fish, nuts, and soy products are important to wound healing. In addition to protein, calories, vitamin C, and zinc help wounds heal. Liquids prevent dehydration that can decrease the blood supply to wounds. Always follow physician recommended diet in regards to patient condition. Instruct on other factors that affect wound healing such as: maintaining general hygiene, not smoking or using tobacco products, offloading pressure and pressure relieving devices, and management of blood sugars.    -SN instructed pt/CG that a CVA/stroke is the term used when a part of the brain is damaged because of a problem with blood flow. Risk factors for stroke include: older age, family history of stroke, diabetes, kidney disease, high cholesterol, plaque buildup in arteries, atrial fibrillation, high blood pressure, smoking cigarettes, drinking too much alcohol, or using illicit drugs, not enough physical activity, obesity, or the use of hormone replacement therapies (in women). Ischemic strokes happen when an artery going to the brain gets clogged or closes off, and part of the brain goes without blood for too long. Treatment for ischemic strokes focuses on reopening clogged arteries. This can be achieved through the use of 'clot-busting' medications and additional medications that help prevent new clots from forming.    -SN instructed pt/CG that symptoms of stroke usually come on suddenly. Signs/symptoms to watch for can easily be recalled using the phrase "BE  FAST" each letter in the word stands for one of the things you should watch for and what to do about it:  Balance - does the person have a sudden loss  of balance?  Eyes - Has the person lost vision in one or both eyes?  Face - Does the person's face look uneven or droop on one side?   Arm - Does the person have weakness or numbness in one or both arms? Does one arm drift down if the person tries to hold both arms out?   Speech - Is the person having trouble speaking? Does his or her speech sound strange?   Time - If you notice any of these stroke signs, call emergency services. You need to act FAST. The sooner treatment begins, the better the chances of recovery.    -SN instructed pt/CG on risks for pressure injury such as: sedentary lifestyle or immobility, inadequate nutrition, moisture, friction, shear, incontinence, poor hygiene, age, or conditions that affect blood flow to the extremities. Instruct patient/caregiver on prevention strategies such as: frequent position changes (at least every 2 hours), proper positioning or use of pressure relieving devices to alleviate pressure points, maintenance of hygiene, checking skin regularly, and following your physician-recommended diet.    -SN reviewed that hypertension is high blood pressure. Blood pressure is the force of blood moving against the walls of the arteries. The blood pressure reading is reported in 2 numbers. The top number, called systole is the pressure inside the arteries when the heart is contracting, and the bottom number called the diastole, is the pressure inside the arteries when the heart is relaxed. HTN causes the blood pressure to get so high that the heart has to work harder than normal. This can damage the heart. The cause of hypertension may not be known. This is called essential or primary hypertension. Hypertension caused by another medical condition, such as kidney disease, is called secondary hypertension. There may be no signs and symptoms of hypertension or may include headache, blurred vision, chest pain, nose bleeds, dizziness, weakness, or trouble breathing. Hypertension is  diagnosed after taking blood pressure readings at several doctor visits. If left untreated, hypertension increases the risk of heart attack, stroke, and kidney disease.    -SN instruct pt/CG in techniques to avoid risks while taking hypoglycemic medication(s) such as: adhering to the schedule of testing blood sugars or other labs for therapeutic levels as instructed by MD, avoiding alcohol while taking these meds,  follow a diet/exercise plan as instructed by MD, staying hydrated. For a missed dose, take it as soon as pt remembers, if it is almost time for the next dose, wait until and take the regular dose. Do not take extra medicine to make up for a missed dose.    -SN instructed pt/CG in care of feet including: wash feet with soap and water daily, wear clean socks daily, shoes that fit properly, checking daily for cracks, sores or cuts, washing daily with warm water and mild soap but do not soak feet, put lotion on feet every night but not between toes, wear comfortable shoes not tight or worn out, never go barefoot, do not use sharp tools on feet, file nails straight across, do not use hot water bottles/heating pads on feet/legs, test water temperature with your elbow, do not smoke, have podiatrist care for your feet regularly, have physician look at feet at each appointment.    Sharps education provided: Containers should be made of hard plastic, be puncture-resistant  and leakproof, such as a laundry detergent or bleach bottle.  When the container is  full, it should be sealed with tape and labeled DO NOT RECYCLE prior to discarding in the regular trash. Patient level of understanding of education provided: Pt verbalized all understanding to education provided    Pt acknowledged understanding of all education provided, some written information also provided for pt to keep and review.    Agency Progress toward goals: Progressing toward established interventions     Patient's Progress towards personal goals:  when patient reaches goals and medication is managed, disease processes are understood patient agrees and understand that discharge will take place.

## 2022-02-20 ENCOUNTER — Encounter: Admit: 2022-02-20 | Discharge: 2022-02-20 | Payer: PRIVATE HEALTH INSURANCE | Primary: Physician Assistant

## 2022-02-21 ENCOUNTER — Encounter: Admit: 2022-02-21 | Discharge: 2022-02-21 | Payer: PRIVATE HEALTH INSURANCE | Primary: Physician Assistant

## 2022-02-21 NOTE — Home Health (Signed)
Skilled reason for visit: Wound care    Caregiver involvement: Pt resides in single story home with her daughter, amb with use of a walker, daughter is pts CG and assts with ADL's, medication, appts and transportation and is available 24/7 to asst as needed.    Home health supplies by type and quantity ordered/delivered this visit include: N/A    Medications reviewed and all medications are available in the home this visit.    The following education was provided regarding medications: SN Instructed pt and CG not to stop, start or change the way she is currently taking any medications without consulting with the MD    - SN reminded pt/CG on opioid/narcotic purpose, dose, appearance, preparation, and scheduling; signs/symptoms for which to notify home health/physician such as: dizziness, sleepiness, constipation, nausea, vomiting, itchiness, dry mouth. Instruct patient/caregiver on signs and symptoms of overdose for which to call emergency services such as: seizures, inability to be woken, trouble staying awake, slow or shallow breathing, slurred speech, blue lips or fingers, confusion, inability to urinate, or inability to stop vomiting. Instructed pt on methods to mitigate risk such as: taking medications as prescribed, keeping a schedule of medications, not taking opioids that are not prescribed to pt, avoiding alcohol, eating high fiber foods, staying hydrated, and exercising as tolerated.    MD notified of any discrepancies/look a-like medications/medication interactions: NA    Medications are effective at this time.     Patient education provided this visit: SEE INTERVENTIONS    -SN instructed pt/CG to notify home health or physician for the following wound healing complications: increased drainage, pus or a foul odor coming from the wound, fever, muscle, joint, or body aches, sweating, increased swelling, redness or red streaks surrounding the wound; dramatic change in color or size of wound; stitches coming  apart or wound reopening; increase in pain at wound site in spite of interventions.    -SN instructed pt/CG on importance of adequate nutrition and hydration for wound healing. Healthy foods give your body the nutrients it needs to heal wounds. Protein foods like meat, fish, nuts, and soy products are important to wound healing. In addition to protein, calories, vitamin C, and zinc help wounds heal. Liquids prevent dehydration that can decrease the blood supply to wounds. Always follow physician recommended diet in regards to patient condition. Instruct on other factors that affect wound healing such as: maintaining general hygiene, not smoking or using tobacco products, offloading pressure and pressure relieving devices, and management of blood sugars.    -SN instructed pt/CG that a CVA/stroke is the term used when a part of the brain is damaged because of a problem with blood flow. Risk factors for stroke include: older age, family history of stroke, diabetes, kidney disease, high cholesterol, plaque buildup in arteries, atrial fibrillation, high blood pressure, smoking cigarettes, drinking too much alcohol, or using illicit drugs, not enough physical activity, obesity, or the use of hormone replacement therapies (in women). Ischemic strokes happen when an artery going to the brain gets clogged or closes off, and part of the brain goes without blood for too long. Treatment for ischemic strokes focuses on reopening clogged arteries. This can be achieved through the use of 'clot-busting' medications and additional medications that help prevent new clots from forming.    -SN instructed pt/CG that symptoms of stroke usually come on suddenly. Signs/symptoms to watch for can easily be recalled using the phrase "BE FAST" each letter in the word stands for one of the  things you should watch for and what to do about it:    Balance - does the person have a sudden loss of balance?  Eyes - Has the person lost vision in one or  both eyes?  Face - Does the person's face look uneven or droop on one side?   Arm - Does the person have weakness or numbness in one or both arms? Does one arm drift down if the person tries to hold both arms out?   Speech - Is the person having trouble speaking? Does his or her speech sound strange?   Time - If you notice any of these stroke signs, call emergency services. You need to act FAST. The sooner treatment begins, the better the chances of recovery.    -SN instructed pt/CG on risks for pressure injury such as: sedentary lifestyle or immobility, inadequate nutrition, moisture, friction, shear, incontinence, poor hygiene, age, or conditions that affect blood flow to the extremities. Instruct patient/caregiver on prevention strategies such as: frequent position changes (at least every 2 hours), proper positioning or use of pressure relieving devices to alleviate pressure points, maintenance of hygiene, checking skin regularly, and following your physician-recommended diet.    -SN reviewed that hypertension is high blood pressure. Blood pressure is the force of blood moving against the walls of the arteries. The blood pressure reading is reported in 2 numbers. The top number, called systole is the pressure inside the arteries when the heart is contracting, and the bottom number called the diastole, is the pressure inside the arteries when the heart is relaxed. HTN causes the blood pressure to get so high that the heart has to work harder than normal. This can damage the heart. The cause of hypertension may not be known. This is called essential or primary hypertension. Hypertension caused by another medical condition, such as kidney disease, is called secondary hypertension. There may be no signs and symptoms of hypertension or may include headache, blurred vision, chest pain, nose bleeds, dizziness, weakness, or trouble breathing. Hypertension is diagnosed after taking blood pressure readings at several  doctor visits. If left untreated, hypertension increases the risk of heart attack, stroke, and kidney disease.    -SN instruct pt/CG in techniques to avoid risks while taking hypoglycemic medication(s) such as: adhering to the schedule of testing blood sugars or other labs for therapeutic levels as instructed by MD, avoiding alcohol while taking these meds,  follow a diet/exercise plan as instructed by MD, staying hydrated. For a missed dose, take it as soon as pt remembers, if it is almost time for the next dose, wait until and take the regular dose. Do not take extra medicine to make up for a missed dose.    -SN instructed pt/CG in care of feet including: wash feet with soap and water daily, wear clean socks daily, shoes that fit properly, checking daily for cracks, sores or cuts, washing daily with warm water and mild soap but do not soak feet, put lotion on feet every night but not between toes, wear comfortable shoes not tight or worn out, never go barefoot, do not use sharp tools on feet, file nails straight across, do not use hot water bottles/heating pads on feet/legs, test water temperature with your elbow, do not smoke, have podiatrist care for your feet regularly, have physician look at feet at each appointment.    Sharps education provided: Containers should be made of hard plastic, be puncture-resistant and leakproof, such as a laundry detergent or bleach  bottle.  When the container is  full, it should be sealed with tape and labeled DO NOT RECYCLE prior to discarding in the regular trash. Patient level of understanding of education provided: Pt verbalized all understanding to education provided    Pt acknowledged understanding of all education provided.     Agency Progress toward goals: Progressing toward established interventions     Patient's Progress towards personal goals: when patient reaches goals and medication is managed, disease processes are understood patient agrees and understand that  discharge will take place.

## 2022-02-21 NOTE — Telephone Encounter (Signed)
Spoke to the patient's daughter and let her know that we did received the form for the wound vac.  Larita Fife the surgery scheduler will have Dr. Yvone Neu sign the form and send to the company.

## 2022-02-23 NOTE — Home Health (Signed)
SUBJECTIVE: Pt reports she was able to get up/down steps to go to MD appt Tuesday but reports fatigue and LE pain limiting her being able to amb  on steps today.  CAREGIVER INVOLVEMENT/ASSISTANCE NEEDED FOR: pts dtr present during PT session and assist pt with all needs prn.  .  OBJECTIVE: See interventions.  PATIENT EDUCATION PROVIDED THIS VISIT: Pt instructed to continue HEP 2x/day and amb with FWW in home every 1-2 hrs with assist and/or stand every hour with walker.  Recommend pt having BSC in kitchen for pt to have to amb to/from [facility] to increase amb tolerance.  PATIENT RESPONSE TO EDUCATION PROVIDED: Pt reports a fair compliance with HEP and does not amb or stand often during the day except to use BSC.  PATIENT RESPONSE TO TREATMENT: Pt had much difficulty standing from couch today and declined stair amb attempts.  .  ASSESSMENT OF PROGRESS TOWARD GOALS: Pt declined stair amb today secondary to c/o increased pain/stiffness in knees.  Pt requires mod/max A to transfer sit to stand from couch today with extra time/effort vs min A.  Pt instructed to perform her exercises 2x/day and to increase amb frequency every day with assistance in home to increase standing/amb tolerance.  Marland Kitchen  PLAN FOR NEXT VISIT: Will plan to attempt stair amb again next visit.  THE FOLLOWING DISCHARGE PLANNING WAS DISCUSSED WITH THE PATIENT/CAREGIVER: Pt is scheduled to continue PT  2w4, 1w1.

## 2022-02-24 ENCOUNTER — Encounter: Payer: PRIVATE HEALTH INSURANCE | Primary: Physician Assistant

## 2022-02-24 ENCOUNTER — Encounter: Admit: 2022-02-24 | Discharge: 2022-02-24 | Payer: PRIVATE HEALTH INSURANCE | Primary: Physician Assistant

## 2022-02-24 NOTE — Home Health (Signed)
SUBJECTIVE: Pt reports she has been doing much better and has not had to take pain meds this AM.  CAREGIVER INVOLVEMENT/ASSISTANCE NEEDED FOR: pts brother present during PT session and assist pt with all needs prn.  .  OBJECTIVE: See interventions.  PATIENT EDUCATION PROVIDED THIS VISIT: Pt instructed to continue HEP 2x/day to BLES in sitting/standing with walker.  Pt to amb < 4x/day with FWW and assist.  Reviewed with pt/pts dtr importance of clear pathways for safety.  PATIENT RESPONSE TO EDUCATION PROVIDED: Pts dtr has minimal area cleared for pathway to/from kitchen from den but not enough room to [employer] walker to front door.  Pt reports a fair compliance with HEP.  PATIENT RESPONSE TO TREATMENT: Pt continues to require frequent rest breaks throughout PT session secondary to c/o fatigue and requires extra time/effort with all activities.  .  ASSESSMENT OF PROGRESS TOWARD GOALS: Outdoor amb deferred today secondary to inclement weather.  Pt reporting an improved walking program 4x/day with FWW and assist and performing her HEP.  Pt continues to have difficulty with standing from low surface couch requiring extra time/effort.  Marland Kitchen  PLAN FOR NEXT VISIT: Will plan to attempt outdoor amb and car transfers next visit.  THE FOLLOWING DISCHARGE PLANNING WAS DISCUSSED WITH THE PATIENT/CAREGIVER: Pt is scheduled to continue PT 2 more visits this week with dc at end of week.

## 2022-02-24 NOTE — Home Health (Signed)
Skilled reason for visit: Wound care    Caregiver involvement: Pt resides in single story home with her daughter, amb with use of a walker, daughter is pts CG and assts with ADL's, medication, appts and transportation and is available 24/7 to asst as needed.    This visit: Pt is A&O x4, able to communicate her needs, daughter administers pts meds and gives her a pain pill as instructed by SN prior to wound care being rendered, pt tolerated well. All paperwork has been completed for wound vac, pending arrival and SN will apply as ordered.    Home health supplies by type and quantity ordered/delivered this visit include: N/A    Medications reviewed and all medications are available in the home this visit.    The following education was provided regarding medications: SN Instructed pt and CG not to stop, start or change the way she is currently taking any medications without consulting with the MD    MD notified of any discrepancies/look a-like medications/medication interactions: NA    Medications are effective at this time.     Patient education provided this visit: SEE INTERVENTIONS    -SN instructed pt/CG to notify home health or physician for the following wound healing complications: increased drainage, pus or a foul odor coming from the wound, fever, muscle, joint, or body aches, sweating, increased swelling, redness or red streaks surrounding the wound; dramatic change in color or size of wound; stitches coming apart or wound reopening; increase in pain at wound site in spite of interventions.    -SN instructed pt/CG that a CVA/stroke is the term used when a part of the brain is damaged because of a problem with blood flow. Risk factors for stroke include: older age, family history of stroke, diabetes, kidney disease, high cholesterol, plaque buildup in arteries, atrial fibrillation, high blood pressure, smoking cigarettes, drinking too much alcohol, or using illicit drugs, not enough physical activity,  obesity, or the use of hormone replacement therapies (in women). Ischemic strokes happen when an artery going to the brain gets clogged or closes off, and part of the brain goes without blood for too long. Treatment for ischemic strokes focuses on reopening clogged arteries. This can be achieved through the use of 'clot-busting' medications and additional medications that help prevent new clots from forming.    -SN instructed pt/CG that symptoms of stroke usually come on suddenly. Signs/symptoms to watch for can easily be recalled using the phrase "BE FAST" each letter in the word stands for one of the things you should watch for and what to do about it:    Balance - does the person have a sudden loss of balance?  Eyes - Has the person lost vision in one or both eyes?  Face - Does the person's face look uneven or droop on one side?   Arm - Does the person have weakness or numbness in one or both arms? Does one arm drift down if the person tries to hold both arms out?   Speech - Is the person having trouble speaking? Does his or her speech sound strange?   Time - If you notice any of these stroke signs, call emergency services. You need to act FAST. The sooner treatment begins, the better the chances of recovery.    -SN instructed pt/CG on risks for pressure injury such as: sedentary lifestyle or immobility, inadequate nutrition, moisture, friction, shear, incontinence, poor hygiene, age, or conditions that affect blood flow to the extremities. Instruct patient/caregiver  on prevention strategies such as: frequent position changes (at least every 2 hours), proper positioning or use of pressure relieving devices to alleviate pressure points, maintenance of hygiene, checking skin regularly, and following your physician-recommended diet.    Pt and CG acknowledged understanding of all education provided.     Agency Progress toward goals: Progressing toward established interventions     Patient's Progress towards personal  goals: when patient reaches goals and medication is managed, disease processes are understood patient agrees and understand that discharge will take place.

## 2022-02-25 ENCOUNTER — Encounter: Payer: PRIVATE HEALTH INSURANCE | Primary: Physician Assistant

## 2022-02-26 ENCOUNTER — Encounter: Admit: 2022-02-26 | Discharge: 2022-02-26 | Payer: PRIVATE HEALTH INSURANCE | Primary: Physician Assistant

## 2022-02-26 NOTE — Home Health (Signed)
Skilled reason for visit: Wound care    Caregiver involvement: Pt resides in single story home with her daughter, amb with use of a walker, daughter is pts CG and assts with ADL's, medication, appts and transportation and is available 24/7 to asst as needed.    This visit: Pt is A&O x4, able to communicate her needs, daughter administers pts meds and gives her a pain pill as instructed by SN prior to wound care being rendered, pt tolerated well. Wound vac applied, pt tolerated well.    Home health supplies by type and quantity ordered/delivered this visit include: N/A    Medications reviewed and all medications are available in the home this visit.    The following education was provided regarding medications: SN Instructed pt and CG not to stop, start or change the way she is currently taking any medications without consulting with the MD    MD notified of any discrepancies/look a-like medications/medication interactions: NA    Medications are effective at this time.     Patient education provided this visit: SEE INTERVENTIONS    -SN instructed pt/CG to notify home health or physician for the following wound healing complications: increased drainage, pus or a foul odor coming from the wound, fever, muscle, joint, or body aches, sweating, increased swelling, redness or red streaks surrounding the wound; dramatic change in color or size of wound; stitches coming apart or wound reopening; increase in pain at wound site in spite of interventions.    -SN instructed pt/CG that a CVA/stroke is the term used when a part of the brain is damaged because of a problem with blood flow. Risk factors for stroke include: older age, family history of stroke, diabetes, kidney disease, high cholesterol, plaque buildup in arteries, atrial fibrillation, high blood pressure, smoking cigarettes, drinking too much alcohol, or using illicit drugs, not enough physical activity, obesity, or the use of hormone replacement therapies (in  women). Ischemic strokes happen when an artery going to the brain gets clogged or closes off, and part of the brain goes without blood for too long. Treatment for ischemic strokes focuses on reopening clogged arteries. This can be achieved through the use of 'clot-busting' medications and additional medications that help prevent new clots from forming.    -SN instructed pt/CG that symptoms of stroke usually come on suddenly. Signs/symptoms to watch for can easily be recalled using the phrase "BE FAST" each letter in the word stands for one of the things you should watch for and what to do about it:    Balance - does the person have a sudden loss of balance?  Eyes - Has the person lost vision in one or both eyes?  Face - Does the person's face look uneven or droop on one side?   Arm - Does the person have weakness or numbness in one or both arms? Does one arm drift down if the person tries to hold both arms out?   Speech - Is the person having trouble speaking? Does his or her speech sound strange?   Time - If you notice any of these stroke signs, call emergency services. You need to act FAST. The sooner treatment begins, the better the chances of recovery.    -SN instructed pt/CG on risks for pressure injury such as: sedentary lifestyle or immobility, inadequate nutrition, moisture, friction, shear, incontinence, poor hygiene, age, or conditions that affect blood flow to the extremities. Instruct patient/caregiver on prevention strategies such as: frequent position changes (at least  every 2 hours), proper positioning or use of pressure relieving devices to alleviate pressure points, maintenance of hygiene, checking skin regularly, and following your physician-recommended diet.    Pt and CG acknowledged understanding of all education provided.     Agency Progress toward goals: Progressing toward established interventions     Patient's Progress towards personal goals: when patient reaches goals and medication is  managed, disease processes are understood patient agrees and understand that discharge will take place.

## 2022-02-26 NOTE — Home Health (Addendum)
SUBJECTIVE: Pt reports she is doing okay today but gets tired easily.    CAREGIVER INVOLVEMENT/ASSISTANCE NEEDED FOR: pts dtr present during PT session and assist pt with all needs prn.  .  OBJECTIVE: See interventions.  PATIENT EDUCATION PROVIDED THIS VISIT: Pt instructed to continue HEP 2x/day to BLEs in sitting.  pt to amb in home with assist and walker atleast 4x/day with FWW  PATIENT RESPONSE TO EDUCATION PROVIDED: Pt reports she has been walking a few times a day but not often.  Pt verbalizes a good understanding of need to walk but reports she is unable to walk on her own with FWW secondary to cluttered home.  PATIENT RESPONSE TO TREATMENT: Pt was able to tolerate amb up/down steps with both hands on one HR but declined outdoor amb secondary to hot weather.  Pt declined standing exercises after stair amb secondary to c/o fatigue.  .  ASSESSMENT OF PROGRESS TOWARD GOALS: Pt began stair amb today but declined outdoor amb today secondary to hot, humid weather.  Pt continues to have much difficulty manuvering in home secondary to narrow passageways and clutter.  Marland Kitchen  PLAN FOR NEXT VISIT: Will plan to attempt stair amb again next visit.  THE FOLLOWING DISCHARGE PLANNING WAS DISCUSSED WITH THE PATIENT/CAREGIVER: Pt is scheduled to continue PT 2w3, 1w1.

## 2022-02-27 ENCOUNTER — Encounter: Payer: PRIVATE HEALTH INSURANCE | Primary: Physician Assistant

## 2022-02-28 ENCOUNTER — Encounter: Admit: 2022-02-28 | Discharge: 2022-02-28 | Payer: PRIVATE HEALTH INSURANCE | Primary: Physician Assistant

## 2022-02-28 ENCOUNTER — Encounter: Payer: PRIVATE HEALTH INSURANCE | Primary: Physician Assistant

## 2022-02-28 NOTE — Home Health (Signed)
Skilled reason for visit: Wound care    Caregiver involvement: Pt resides in single story home with her daughter, amb with use of a walker, daughter is pts CG and assts with ADL's, medication, appts and transportation and is available 24/7 to asst as needed.    This visit: Pt is A&O x4, able to communicate her needs, daughter administers pts meds and gives her a pain pill as instructed by SN prior to wound care being rendered, pt tolerated wound care and vac reapplication well. SN educated pts daughter/CG on what to do if seal breaks on vac, she was able to demonstrate proper procedure.    Home health supplies by type and quantity ordered/delivered this visit include: SN placed an order for supplies to be sent to patients home    Medications reviewed and all medications are available in the home this visit.    The following education was provided regarding medications: SN Instructed pt and CG not to stop, start or change the way she is currently taking any medications without consulting with the MD    MD notified of any discrepancies/look a-like medications/medication interactions: NA    Medications are effective at this time.     Patient education provided this visit: SEE INTERVENTIONS    -SN instructed pt/CG to notify home health or physician for the following wound healing complications: increased drainage, pus or a foul odor coming from the wound, fever, muscle, joint, or body aches, sweating, increased swelling, redness or red streaks surrounding the wound; dramatic change in color or size of wound; stitches coming apart or wound reopening; increase in pain at wound site in spite of interventions.    -SN instructed pt/CG that a CVA/stroke is the term used when a part of the brain is damaged because of a problem with blood flow. Risk factors for stroke include: older age, family history of stroke, diabetes, kidney disease, high cholesterol, plaque buildup in arteries, atrial fibrillation, high blood pressure,  smoking cigarettes, drinking too much alcohol, or using illicit drugs, not enough physical activity, obesity, or the use of hormone replacement therapies (in women). Ischemic strokes happen when an artery going to the brain gets clogged or closes off, and part of the brain goes without blood for too long. Treatment for ischemic strokes focuses on reopening clogged arteries. This can be achieved through the use of 'clot-busting' medications and additional medications that help prevent new clots from forming.    -SN instructed pt/CG that symptoms of stroke usually come on suddenly. Signs/symptoms to watch for can easily be recalled using the phrase "BE FAST" each letter in the word stands for one of the things you should watch for and what to do about it:    Balance - does the person have a sudden loss of balance?  Eyes - Has the person lost vision in one or both eyes?  Face - Does the person's face look uneven or droop on one side?   Arm - Does the person have weakness or numbness in one or both arms? Does one arm drift down if the person tries to hold both arms out?   Speech - Is the person having trouble speaking? Does his or her speech sound strange?   Time - If you notice any of these stroke signs, call emergency services. You need to act FAST. The sooner treatment begins, the better the chances of recovery.    -SN instructed pt/CG on risks for pressure injury such as: sedentary lifestyle or immobility,  inadequate nutrition, moisture, friction, shear, incontinence, poor hygiene, age, or conditions that affect blood flow to the extremities. Instruct patient/caregiver on prevention strategies such as: frequent position changes (at least every 2 hours), proper positioning or use of pressure relieving devices to alleviate pressure points, maintenance of hygiene, checking skin regularly, and following your physician-recommended diet.    Pt and CG acknowledged understanding of all education provided.     Agency  Progress toward goals: Progressing toward established interventions     Patient's Progress towards personal goals: when patient reaches goals and medication is managed, disease processes are understood patient agrees and understand that discharge will take place.

## 2022-03-01 ENCOUNTER — Encounter: Payer: PRIVATE HEALTH INSURANCE | Primary: Physician Assistant

## 2022-03-03 ENCOUNTER — Encounter: Admit: 2022-03-03 | Discharge: 2022-03-03 | Payer: PRIVATE HEALTH INSURANCE | Primary: Physician Assistant

## 2022-03-03 ENCOUNTER — Inpatient Hospital Stay: Admit: 2022-03-03 | Payer: PRIVATE HEALTH INSURANCE | Primary: Physician Assistant

## 2022-03-03 ENCOUNTER — Encounter: Payer: PRIVATE HEALTH INSURANCE | Primary: Physician Assistant

## 2022-03-03 ENCOUNTER — Ambulatory Visit
Admit: 2022-03-03 | Discharge: 2022-03-03 | Payer: PRIVATE HEALTH INSURANCE | Attending: Acute Care | Primary: Physician Assistant

## 2022-03-03 DIAGNOSIS — I6503 Occlusion and stenosis of bilateral vertebral arteries: Secondary | ICD-10-CM

## 2022-03-03 LAB — SENTARA SPECIMEN COLLECTION

## 2022-03-03 NOTE — Home Health (Signed)
Skilled reason for visit: Wound care    Caregiver involvement: Pt resides in single story home with her daughter, amb with use of a walker, daughter is pts CG and assts with ADL's, medication, appts and transportation and is available 24/7 to asst as needed.    This visit: Pt is A&O x4, able to communicate her needs, daughter administers pts meds and gives her a pain pill as instructed by SN prior to wound care being rendered, pt was moving slow today and c/o being in pain d/t having 2 MD appointments and a lab appointment, c/o pain in her neck, back and sacrum, pt did not take a pain pill until after the wound vac change because she stated it made her sleepy and SN needed her awake for repositioning, after dsg change was completed CG administered pain medication and pt was able to voice some pain relief before end of visit. Wound remains stable no changes noted at this time.    Home health supplies by type and quantity ordered/delivered this visit include: SN placed an order for supplies to be sent to patients home    Medications reviewed and all medications are available in the home this visit.    The following education was provided regarding medications: SN Instructed pt and CG not to stop, start or change the way she is currently taking any medications without consulting with the MD    MD notified of any discrepancies/look a-like medications/medication interactions: NA    Medications are effective at this time.     Patient education provided this visit: SEE INTERVENTIONS    -SN instructed pt/CG to notify home health or physician for the following wound healing complications: increased drainage, pus or a foul odor coming from the wound, fever, muscle, joint, or body aches, sweating, increased swelling, redness or red streaks surrounding the wound; dramatic change in color or size of wound; stitches coming apart or wound reopening; increase in pain at wound site in spite of interventions.    -SN instructed pt/CG  on risks for pressure injury such as: sedentary lifestyle or immobility, inadequate nutrition, moisture, friction, shear, incontinence, poor hygiene, age, or conditions that affect blood flow to the extremities. Instruct patient/caregiver on prevention strategies such as: frequent position changes (at least every 2 hours), proper positioning or use of pressure relieving devices to alleviate pressure points, maintenance of hygiene, checking skin regularly, and following your physician-recommended diet.    Pt and CG acknowledged understanding of all education provided.     Agency Progress toward goals: Progressing toward established interventions     Patient's Progress towards personal goals: when patient reaches goals and medication is managed, disease processes are understood patient agrees and understand that discharge will take place.

## 2022-03-03 NOTE — Patient Instructions (Signed)
Patient instructions:  -aspirin 325 mg daily  -atorvastatin 80 mg  -BP, cholesterol, glucose control  -neurovascular referral [phone]   -home health therapies  -follow up with Dr. Rosana Hoes.  Follow up regarding sleep apnea.

## 2022-03-03 NOTE — Progress Notes (Signed)
[facility]  [address], [place], [place] [id number]  Office:  [phone]  Fax: [phone]  Chief Complaint   Patient presents with    Cerebrovascular Accident       HPI: [name] Short presents in hospital follow-up for stroke.  She was seen at [facility] in neurology consultation by Dr. Welton FlakesKhan on 12/29/2021.  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  History per note is as follows.  She presented with lower extremity weakness, as per daughter.  Was able to walk but recently unable to do so.  Daughter noted that she could not stand at home and had right upper quadrant pain.  The pain resolved in the hospital.  She was not able to stand up and had a recent procedure, cervical decompression and fusion 2 weeks prior and was evaluated for the symptoms by her spine surgeon Dr. Carney LivingKerner.  He did not think her issues were related to her recent surgery per his note from that day.  Daughter notes that the patient has been getting progressively weaker over months.  She went from a cane to a walker in 6 months.  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 Carney LivingKerner evaluated the MRI of those aforementioned regions and did not think any findings were contributing to her symptoms at this time.  Daughter noted the patient had memory problems and forgetfulness for the last 6 months.  Patient reported taking her aspirin regularly before.  Patient reported lower extremity paresthesias and neck pain.  MRI of the brain reported small lacunar infarct in the left thalamus seen on prior head CT from 5/26 is compatible with an acute infarct.  Small chronic infarcts in the left cerebellar hemisphere.  Postsurgical changes in the cervical spine.  Multiple chronic infarcts within the pons.  TTE showed LVEF 60%.  She has history of sleep apnea.  Memory problems consideration with  vascular dementia.  MRI in 2022 showed old stroke with hippocampal volume loss.  She was started on memantine 5 mg daily.  Recommended for neuropsychological evaluation outpatient.  Vitamin B12 level normal, 477.  Lipid panel showed total cholesterol 153, triglycerides 173, HDL low (34), LDL-C 84.4.  Hemoglobin A1c was 7.6.    She presents today in follow-up.  She is with her daughter.  After discharge, she went to the ARU for 17 days then SNF then home now with daughter.  She has home health wound nurse and therapists.  Has home health PT and OT.  Had ST in the facilities but finished.  No problems swallowing.  Daughter noted memory concerns previously.  Reports her neurologist Dr. Rosana HoesSheth was following her for previous stroke (started seeing him last year), also had sleep study and dx with sleep apnea.  New dx, not placed on CPAP yet.  Had other things going on, spine surgery.  Then had the stroke.  Symptoms which prompted c-spine surgery: problems walking, then had to use walker, so then she had c-spine surgery, glucose high after surgery from steroids, had sacral ulcer, had home health PT after.  Sees Dr. Tana CoastAlcantara and Dr. Carney LivingKerner.  +urinary urgency since first stroke in 2014, weakness of right side of the body.  Seeing Dr. Rosana HoesSheth since last year due to problems walking.  Hand grips better after c-spine surgery.  Muscle spasms progressively worse, right leg.  Since the time of the stroke.  Spasms and cramps in RLE.  Better if she exercises and moves it.  It spasms if she lifts the leg.  Right arm just sore from leaning on it.  Not great at getting up but working on it.  Needs help for transfers most of the time.  Sofa in [place] is too low.  Knee pain from getting into sofa which is too low.  Lives with daughter.  She is not there all the time but most of the time now.  Missed appt with eye doctor but will reschedule it.  No headaches.  Memory concerns: per daughter, problems with answering questions.  Daughter notes  she has wax buildup in ear.  'Processing issue' answering questions.  Patient admits to forgetting some stuff, she relates it to 'old age'.  Was on aspirin 81 mg (was still off from surgery) and daughter reports she was off atorvastatin for hypercalcemia (has parathyroid disease).  Back on aspirin 81 mg and atorvastatin 80 mg.  They report she was also on Plavix since the stroke in 2014.  Was off of that as well due to surgery.  Stopped smoking before c-spine surgery (cigars, less than 1 in a day).  Alcohol occasional, denies excessive use; no drug use.      Past Medical History:   Diagnosis Date    Anemia     Arthritis     Chronic pain     Legs and Shoulders    Diabetes (HCC)     Exposure to asbestos     History of blood transfusion     History of colon polyps     HTN (hypertension)     Hx of blood clots     DVT    Hyperlipemia     Menopause     Pulmonary nodule     Serum calcium elevated     Sleep apnea     not using cpap    Stroke [facility])        Past Surgical History:   Procedure Laterality Date    CESAREAN SECTION  1987    COLONOSCOPY N/A 01/13/2017    COLONOSCOPY performed by Levan Hurst, MD at [facility] ENDOSCOPY    COLONOSCOPY      DILATION AND CURETTAGE OF UTERUS  1995    RECTAL SURGERY N/A 12/28/2021    SACRAL WOUND DEBRIDEMENT INCISION AND DRAINAGE performed by Phineas Semen, DO at [facility] MAIN OR    SPINE SURGERY N/A 12/09/2021    CERVICAL THREE/FOUR/FIVE/SIX LAMINECTOMY FUSION; C-ARM; STRYKER; EXT BONE GROWTH STIM; 23 HR performed by Pamalee Leyden, MD at [facility] MAIN OR       Current Outpatient Medications   Medication Sig Dispense Refill    Baclofen 5 MG PACK Take 1 tablet by mouth every 12 hours as needed (muscle spasms).      oxyCODONE (ROXICODONE) 5 MG immediate release tablet Take 1 tablet by mouth every 6 hours as needed for Pain.      acetaminophen (TYLENOL) 500 MG tablet Take 1 tablet by mouth every 6 hours as needed for Fever or Pain 30 tablet 0    insulin glargine (LANTUS) 100 UNIT/ML injection vial Inject  34 Units into the skin daily 10 mL 0    insulin lispro (HUMALOG) 100 UNIT/ML SOLN injection vial Inject 4 Units into the skin 3 times daily (with meals) 1 each 0    insulin lispro (HUMALOG) 100 UNIT/ML SOLN injection vial Sliding Scale insulin as  per Institutional protocol (Patient not taking: Reported on 02/11/2022) 1 each 0    lactobacillus delayed release capsule Take 1 capsule by mouth daily (with breakfast) (Patient not taking: Reported on 02/12/2022) 15 capsule 0    hydrALAZINE (APRESOLINE) 10 MG tablet Take 1 tablet by mouth every 6 hours as needed (As needed for systolic blood pressure greater than 160) 90 tablet 0    amLODIPine (NORVASC) 5 MG tablet Take 1 tablet by mouth daily 30 tablet 0    piperacillin-tazobactam (ZOSYN) infusion Compound per protocol.  Continue IV Zosyn per pharmacy dosing through February 06, 2022 (Patient not taking: Reported on 02/11/2022) 304 g 1    aspirin 81 MG chewable tablet Take 1 tablet by mouth daily 30 tablet 3    atorvastatin (LIPITOR) 80 MG tablet Take 1 tablet by mouth nightly 30 tablet 3    ferrous sulfate (IRON 325) 325 (65 Fe) MG tablet Take 1 tablet by mouth daily 30 tablet     Multiple Vitamins-Minerals (THERAPEUTIC MULTIVITAMIN-MINERALS) tablet Take 1 tablet by mouth daily      furosemide (LASIX) 40 MG tablet furosemide 40 mg tablet   TAKE 1 TABLET BY MOUTH EVERY DAY       No current facility-administered medications for this visit.        Allergies   Allergen Reactions    Lactose Other (See Comments)     Intolerance       Social History     Tobacco Use    Smoking status: Former     Packs/day: 1.00     Types: Cigarettes, Cigars     Quit date: 11/13/2021     Years since quitting: 0.3    Smokeless tobacco: Never   Vaping Use    Vaping Use: Never used   Substance Use Topics    Alcohol use: Not Currently     Comment: occ    Drug use: Never       Family History   Problem Relation Age of Onset    Diabetes Other 45        parent, NOS    Cancer Father     Hypertension Other 35         parent,NOS    Heart Disease Other 54        parent, NOS    Lung Disease Father        Review of Systems:  GENERAL: Denies fever or fatigue  CARDIAC: No CP or SOB  PULMONARY: No cough or SOB  MUSCULOSKELETAL: No new joint pain. +buttocks pain. (Sacral ulcer)  NEURO: SEE HPI    Physical Examination:  BP 130/70   Pulse 87   Resp 18   Ht 4\' 11"  (1.499 m)   Wt 147 lb (66.7 kg)   BMI 29.69 kg/m     Alert, in NAD. Heart is regular. Oriented x3. She scored 30/30 on the MMSE.  Clock drawing intact.  Speech is fluent. Speech clear. Daughter helped with history. EOMs are full, PERRL, VFFTC, no nystagmus. No facial asymmetry. Tongue is midline. Strength and tone are normal.  Mild right upper extremity drift. Fine finger movements fairly symmetrical. FNF intact bilaterally. DTRs increased in the bilateral lower extremities.  Hoffmann negative. +BLE edema. (NPO for labs after this; did not take Lasix yet). In wheelchair.  She stood up to help with the sacral pain but that took effort.    MRI BRAIN WO CONT 12/30/2021:  Narrative & Impression  MR BRAIN WITHOUT CONTRAST  HISTORY: Age-indeterminate left thalamic lucency on head CT from 12/27/2021,  possible infarct. Right weakness     COMPARISON: CT 12/27/2021     TECHNIQUE: Axial and sagittal T1W scans, axial T2W scans, axial FLAIR, Swan, and  axial diffusion weighted images.     FINDINGS:      Brain parenchyma: Small acute infarct in left thalamus corresponding to CT  abnormality. No evidence of hemorrhagic transformation. Multiple chronic  infarcts bilateral pons and small infarct left cerebellum. Mild periventricular  and deep white matter FLAIR hyperintensity suggestive chronic microvascular  ischemic disease.     Extra-axial spaces, ventricles, basal cisterns: No extra-axial fluid collections  are identified. Mild prominence of ventricles suggestive of central greater than  cortical volume loss. Basal cisterns are patent.      Orbits and paranasal sinuses: Orbits are  within normal limits for technique.  Included paranasal sinuses and mastoid air cells are clear.     Calvarium and skull base: Calvarium is normal limits. The sella turcica is  within normal limits.     Visualized upper cervical spine: Postsurgical changes to the visualized upper  cervical spine incompletely evaluated.      IMPRESSION:     Small acute infarct in the left thalamus corresponding to lucency on recent head  CT.     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     White matter changes suggestive of mild chronic microvascular ischemic disease.     [facility] preliminary report provided 12/30/2021 at 1824.      MRA HEAD WO CONT 01/02/2022:  Narrative & Impression  MRA WITHOUT CONTRAST     HISTORY: Left colonic infarct, weakness     COMPARISON: Concurrent brain MRI     TECHNIQUE: 3-D time of flight MR angiography of the intracranial arteries was  obtained and portrayed in raw data and maximum intensity projection formats.     FINDINGS:     ANTERIOR CIRCULATION:   Internal carotid: Moderate narrowing of the distal left cavernous ICA otherwise  patent right ICA.  Anterior cerebral: Bilateral A1 and proximal A2 segments are within normal  limits.   Anterior communicating: Nonvisualized  Middle cerebral: The bilateral M1 and M2 segments are within normal limits.  Posterior communicating:  The bilateral posterior communicating arteries are  within normal limits.     Posterior circulation:   Vertebral: Right vertebral artery is patent with moderate long segment narrowing  of the distal V4 segment. Left vertebral artery is occluded intradurally minimal  distal reconstitution, possibly retrograde flow.  Basilar: Diminutive basilar artery.  Superior cerebellar: Origins are patent bilaterally.  Posterior cerebral: Dominant supply bilateral posterior communicating arteries  bilaterally with diminutive P1 segments. Tapering of the distal P2 segment on  the right.     IMPRESSION:     1.   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  2.  Narrowing of the distal right vertebral artery.  3.  Moderate stenosis left cavernous ICA.      MRA NECK WO CONT 01/02/2022:  Narrative & Impression  MRA OF THE NECK WITHOUT CONTRAST     HISTORY: Left thalamic infarct, weakness     COMPARISON: Concurrent brain MRA     TECHNIQUE: Noncontrast scanning of the neck arteries focused at the carotid  artery bifurcations was accomplished with 2D TOF technique.  Source images of  the arteries from the aorta up to the skull base processed with  MIP algorithm.     CONTRAST: None.     FINDINGS:   Right carotid: Patent.   Left carotid: Patent.   Suboptimal evaluation of stenoses secondary to lack of intravenous contrast  however no limiting stenosis identified.     Right vertebral: Patent.   Left vertebral: Occluded.         IMPRESSION:     1.  Occluded cervical left vertebral artery.  2.  Otherwise patent right vertebral artery and bilateral carotid arteries.  3.  Suboptimal evaluation of stenoses secondary to lack of intravenous contrast  however no limiting stenosis identified of the ICA origins identified.      Impression/Plan:  This is a 66 year old female who presents in hospital follow-up for stroke.  She has medical history to include hypertension, hyperlipidemia, type 2 diabetes, newly diagnosed sleep apnea, and history of DVT.  She presented to the hospital on 12/26/2021 for weakness of the lower extremities.  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  Her daughter noted she had been getting weaker progressively over months.  She went from a cane to a walker over 6 months.  MRI of the brain reported small acute infarct in the left thalamus.  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  Mild chronic microvascular ischemic disease.  The patient has a history of stroke in  2014 (acute infarct in the left central pons).  She reports she was on aspirin 81 mg and Plavix since 2014.  She reports she was off aspirin and Plavix and was not back on it after surgery and was not on her atorvastatin either.  She is on aspirin 81 mg at this time.  Recommended taking aspirin 325 mg.  She is going to see her PCP next week.  Recommended neurovascular evaluation due to occluded cervical and intradural left vertebral artery, noted to be likely chronic, and also noted to have right vertebral artery moderate long segment narrowing of the distal L4 segment.  Recommended to obtain evaluation from neurovascular regarding dual antiplatelet therapy versus single and any further recommendations.  She is on atorvastatin 80 mg.  LDL goal less than 70.  Continue vascular risk factor management.  Daughter notes BP is improved than previous.  Hemoglobin A1c goal less than 7.  She is in home PT and OT.  May consider adding home ST for cognitive therapy.  She scored 30 out of 30 on the MMSE today and normal on clock drawing.  May consider neuropsychological evaluation later if warranted.  She has a current neurologist but was set up to come here post hospitalization.  She has an appointment with her neurologist next month.  She was diagnosed with sleep apnea but with everything going on recently she has not had treatment yet.  She will follow up with her neurologist Dr. Rosana Hoes for this.  She will follow up with her PCP next week.  Follow up here as needed.    Arnella was seen today for cerebrovascular accident.    Diagnoses and all orders for this visit:    Stenosis of both vertebral arteries  -     External Referral To Neurology    History of stroke  -     External Referral To Neurology      Total time 45 minutes with 25 minutes spent in counseling.     Signed By: Maurine Simmering, APRN - NP        PLEASE NOTE:  Portions of this document may have been produced using voice recognition software. Unrecognized errors in  transcription may be present.

## 2022-03-04 ENCOUNTER — Encounter: Admit: 2022-03-04 | Discharge: 2022-03-04 | Payer: PRIVATE HEALTH INSURANCE | Primary: Physician Assistant

## 2022-03-04 NOTE — Home Health (Signed)
SUBJECTIVE: Pt repors she is doing okay today and she is ready to go outdoors.  CAREGIVER INVOLVEMENT/ASSISTANCE NEEDED FOR: Pts dtr present during PT session and assist pt with all needs prn.  .  OBJECTIVE: See interventions.  PATIENT EDUCATION PROVIDED THIS VISIT: Pt instructed to continue HEP 2x/day to BLEs in sitting/standing and amb every 2 waking hours in home with FWW and assist.  PATIENT RESPONSE TO EDUCATION PROVIDED: Pt requires occ cuing to safely manuver walker through narrow pathways and around obstacles/clutter.  Pt reports a fair compliance with walking program and exercises but does do her sitting exercises before she tries to stand.  PATIENT RESPONSE TO TREATMENT: Pt was able to amb up/down steps today but declined standing exercises after amb secondary to c/o fatigue.  .  ASSESSMENT OF PROGRESS TOWARD GOALS: Pt agreed to amb up/down steps today and outdoors and requires occ cuing for proper walker placement for safety and to increase foot clearance/upright posture.  Pt reports a fair compliance with HEP and reviewed with pt importance of compliance with HEP and walking program to increase LE strength and decrease fall risk.  Marland Kitchen  PLAN FOR NEXT VISIT: Will plan to attempt outdoor amb again next visit.  THE FOLLOWING DISCHARGE PLANNING WAS DISCUSSED WITH THE PATIENT/CAREGIVER: Pt is scheduled to continue PT 1 more time this week then 2w2,, 1w1.

## 2022-03-05 ENCOUNTER — Encounter: Admit: 2022-03-05 | Discharge: 2022-03-05 | Payer: PRIVATE HEALTH INSURANCE | Primary: Physician Assistant

## 2022-03-05 NOTE — Home Health (Signed)
SN received a call from pts daughter stating the wound vac was beeping and reading low pressure, she tried trouble shooting with SN over the phone to no avail. SN preformed a SNC visit to reinforce the drape over the pts wound and reinstated the proper seal. Wound vac noted to be performing proper suctioning at as ordered, pt tolerating well with no further problems reported by CG today.

## 2022-03-05 NOTE — Home Health (Signed)
Caregiver involvement: Michelle Nasuti (daughter) lives with pt and provides daily assistance with ADL and mobility    Medications reviewed and all medications are available in the home this visit.    The following education was provided regarding medications, medication interactions, and look alike medications (specify): Continue as directed by MD.    Medications  are effective at this time.      Patient education provided this visit: see ADL note    Sharps education provided:  Clinician instructed patient/CG on proper disposal of sharps: Containers should be made of hard plastic, be puncture-resistant and leakproof, such as a laundry detergent or bleach bottle. When the container is  full, it should be sealed with tape and labeled DO NOT RECYCLE prior to discarding in the regular trash.   BG 145    Patient level of understanding of education provided: see ADL note    Skilled Care Performed this visit: Completed OT evaluation and assessment for safety with ADL and mobility.    Patient response to procedure performed:  Ms. Sawtell had a good response to therapy.  She denies having pain that limits particiaption with assessment and vitals within therapy parameters.    Patient's Progress towards personal goals: Ms. Schaffer has fair rehab potential.  Her mobility in the home is limited due to cluttered and unsafe walkways.  Advised daughter to clear out area for pt safety.  Pt currently requires min A for safety with transfers with RW and max A for ADL tasks.    Home exercise program: B UE strengthening against gravity for shoulder flexion/extension, overhead press, chest press, ab/adduction and elbow flexion/extension    Continued need for the following skills: OT, PT, SN    Discharge Plans:  1w1, 2w2, 1w1 with plans to discharge to self once maximal potential has been achieved with self care and functional mobility in the home.    CONTINUED NEED FOR THE FOLLOWING SKILLS: HH OT is medically necessary to address barriers of  decreased activity tolerance, generalized weakness, decreased balance and pain.  These barriers limit pt's participation in ADL and functional mobility safely and would benefit from continued skilled OT to maximize independence and reduce burden on caregiver.    PAST MEDICAL HISTORY: Cerebrovascular disease (stroke x 3 -- 04/2013, summer 2022, 12/2021); diabetes mellitus; anemia; hyperlipidemia; urinary incontinence; history of UTI; osteoarthritis; RLE lymphedema; history of RLE DVT; parathyroid adenoma; primary hyperparathyroidism; uterine polyps; obstructive sleep apn ea; cervical spondylosis with myelopathy     PAST SURGICAL HISTORY: Cesarean section; D&C; cervical laminectomy/fusion C3-C6 with bone growth stimulator   placement (12/09/2021); debridement of sacral pressure ulcer (12/28/2021)     SOCIALHISTORY: Single; one daughter; history of tobacco use (up to 1ppd x 40 years, then 1 cigar/day x 2 years; quit 11/2021); history of EtOH (socially, beer); history of illicit drug use (marijuana)     FAMILY HISTORY: Father (deceased; asbestosis); mother (deceased; CAD, CHF) ALLERGIES: Lactose intolerant MEDICATIONS: Medication list was reviewed. REVIEW OF SYSTEMS: Constitutional: +fatigue; denies fever, chills; appetite improving Skin: +sacral   wound (see nursing notes) ENT: denies ear pain, runny nose, nasal congestion, or sore throat Resp denies shortness of breath cough     ASSESSMENT/PLAN:     1. Infected sacral de cubitus ulcer -- Patient is s/p debridement by general surgery. See nursing/wound care notes for details. Wound VAC is currently   being used, but orders may be changed due to necrotic tissue. F/U with wound care nurse/physician for wound management. Continue IV antibiotics (Zosyn,  Vancomycin) through 02/06/2022. Pharmacy to dose Vancomycin. Recommended offloading pressure from the area   as tolerated. Continue Hydrocodone/APAP PRN pain. Start Pro-Stat AWC 63ml daily for wound healing.     2. Cervical  spondylosis with myelopathy- - S/P cervical C3-C6 laminectomy/fusion. Bone stimulator in place, and electrodes will need to be changed weekly and   battery changed daily. F/U with surgeon daily.     3. Cerebrovascular disease-Most recent stroke was in 12/2021. Continue Aspirin, statin, appropriate BP and DM control. Continue supportive care.     4. HTN C hronic. Controlled. Continue antihypertensive medications as prescribe     5. Diabetes mellitus with hyperglycemia - Chronic with variable control. Continue to monitor blood sugars QAC and QHS. Continue Lantus/Humalog SSI as ordered. Adjust medications as needed for optimal glycemic control.       6. Muscle spasms - Intermittent of bilateral UE (R> L). Start Baclofen 5mg  BID.     7. Slow transit constipation -- Uncontrolled. Exa cerbated by limited mobility and medications. Increase water/prune juice intake. Add Miralax daily PRN constipation (patient refused daily bowel medication).     8. Iron deficiency anemia - Chronic. No active bleeding reported. Continue iron, vitamin C. Check CBC, iron studies, B12, folate for monitoring.     9. Hyperlipidemia -- Chronic. Continue statin daily.     10. Urinary incontinence - Chronic. Daughter requested a PureW ick system for use at nighttime. She states she will see if she can purchase one as they are not provided by the facility. For now, staff to change patient per protocol and PRN.     11. BMI 31.5- Encouraged healthy dietary and lifestyle choices. F/U with dietician as scheduled. 11. Debility/physical deconditioning - Secondary to recent hospitalizations and comorbidities noted above. E/U with PT/OT to improve strength/mobility.     12. ACP - Full code

## 2022-03-06 ENCOUNTER — Encounter: Admit: 2022-03-06 | Discharge: 2022-03-06 | Payer: PRIVATE HEALTH INSURANCE | Primary: Physician Assistant

## 2022-03-06 NOTE — Case Communication (Signed)
Occupational Therapy Evaluation    1w1, 2w2, 1w1    [name] Short is unable to safely ambulate in her home due to severely cluttered walkways and inability for RW to safely fit throughout the home.  Encouraged daughter to clear a pathway for pt safety and explained that the home situation will limit pt's safety with mobility.  Daughter received the message well and nodded in agreement.   [name] Short uses her RW and is able to stand pivot with C GA.  Extra assistance needed to keep cords clear from RW and feet.  Multiple extension cords along with wound vac tubing increase pt risk for falls.  Pt performs sponge bathing on the couch where she sleeps.  Daughter is assisting with most of the ADL due to pt c/o feeling weaker.  Suggested allowing pt to initiate tasks such as bathing and then assist as needed to encourage increased pt participation.  Pt states that she is unable to p ull her own clothing up/down with toileting due to wearing a brief.  Suggested wearing depends; however, daughter states that pt urinates through them.  Discussed using a bowel/bladder program and toileting every 2 hours.  Pt has a 3:1 that is placed at couch side for safety and ease with transfer.  Pt receptive to suggestion to increase toileting during the day and daughter states that PT is also asking pt to get up more frequently; ho wever, pt has not been compliant.  Education provided that increase mobility with transfers will reduce further skin break down and promote healing of pressure ulcer.  [name] Short requires min A for sit <> stand from the low couch surface.  Suggested placing risers under corners of the couch; however, daughter stated they were waiting for a hospital bed instead.  Daughter not agreeable to use lifts under couch at this time.  [name] Short has a  hip kit to assist with LB dressing but denies using it recently.  She states that she needs help with UB dressing because her daughter doesn't want her to accidentally pull out her  Libre glucose monitor.  Suggested using hemi technique strategy and dressing the arm with monitor in it first for increased pt participation with ADL and to keep monitor safe.  [name] Short is agreeable for OT to continue with education and instruction on strate gies to maximize her safety and participation with ADL tasks.

## 2022-03-06 NOTE — Home Health (Signed)
SUBJECTIVE: Pt reports she is tired today but wants to go outside.  Pt reports now that she has a daytime caregiver she is planning on sitting out on porch more often.  CAREGIVER INVOLVEMENT/ASSISTANCE NEEDED FOR: Pts dtr and daytime caregiver assist pt with all needs prn and pts caregiver present during PT session.  .  OBJECTIVE: See interventions.  PATIENT EDUCATION PROVIDED THIS VISIT: Pt to amb 6x/day in home with FWW.  Pt to continue HEP 2x/day to BLEs in sitting/standing with walker and assist.  PATIENT RESPONSE TO EDUCATION PROVIDED: Pt/pts caregiver reports pt will increase amb frequency and has been performing sitting exercises 2x/day.  PATIENT RESPONSE TO TREATMENT: Pt requires occ rest breaks throughout PT session secondary to c/o fatigue .  Pt requires occ cuing to safely manuver around obstacles outdoors and keep walker closer to her.  Pt was able to fold walker and manuver through living room safely with S.    .  ASSESSMENT OF PROGRESS TOWARD GOALS: Pt was able to fold walker and manuver through narrow pathway in living room to front door from kitchen today without cuing required today for technique.  Pt amb up/down steps with SBA vs CGA today.    Marland Kitchen  PLAN FOR NEXT VISIT: sup visit with PT next week.  THE FOLLOWING DISCHARGE PLANNING WAS DISCUSSED WITH THE PATIENT/CAREGIVER: Pt is scheduled to continue PT 2w2, 1w1.

## 2022-03-06 NOTE — Home Health (Signed)
Skilled reason for visit: Wound care    Caregiver involvement: Pt resides in single story home with her daughter, amb with use of a walker, daughter is pts CG and assts with ADL's, medication, appts and transportation and is available 24/7 to asst as needed.    This visit: Pt is A&O x4, able to communicate her needs, daughter administers pts meds and gives her a pain pill as instructed by SN prior to wound care being rendered, pt continues to tolerate wound vac well, SN will place a call to MD d/t concern that wound edges are starting to roll inwards (epibole), informed pt and daughter of SN concern.    Home health supplies by type and quantity ordered/delivered this visit include: SN placed an order for supplies to be sent to patients home    Medications reviewed and all medications are available in the home this visit.    The following education was provided regarding medications: SN Instructed pt and CG not to stop, start or change the way she is currently taking any medications without consulting with the MD    MD notified of any discrepancies/look a-like medications/medication interactions: NA    Medications are effective at this time.     Patient education provided this visit: SEE INTERVENTION LIST    -SN instructed pt/CG to notify home health or physician for the following wound healing complications: increased drainage, pus or a foul odor coming from the wound, fever, muscle, joint, or body aches, sweating, increased swelling, redness or red streaks surrounding the wound; dramatic change in color or size of wound; stitches coming apart or wound reopening; increase in pain at wound site in spite of interventions.    Pt and CG acknowledged understanding of all education provided.     Agency Progress toward goals: Progressing toward established interventions     Patient's Progress towards personal goals: when patient reaches goals and medication is managed, disease processes are understood patient agrees and  understand that discharge will take place.

## 2022-03-07 ENCOUNTER — Encounter

## 2022-03-07 MED ORDER — HYDROCODONE-ACETAMINOPHEN 5-325 MG PO TABS
5-325 MG | ORAL_TABLET | Freq: Four times a day (QID) | ORAL | 0 refills | Status: AC | PRN
Start: 2022-03-07 — End: 2022-03-12

## 2022-03-07 NOTE — Telephone Encounter (Signed)
Patient daught Michelle Nasuti LVM on nurse line requesting refill on pain medication for patient.  Pharmacy already on file [facility] [address]  States please call [phone] with any questions or concerns

## 2022-03-10 ENCOUNTER — Encounter: Admit: 2022-03-10 | Discharge: 2022-03-10 | Payer: PRIVATE HEALTH INSURANCE | Primary: Physician Assistant

## 2022-03-10 NOTE — Home Health (Signed)
Skilled reason for visit: Wound care    Caregiver involvement: Pt resides in single story home with her daughter, amb with use of a walker, daughter is pts CG and assts with ADL's, medication, appts and transportation and is available 24/7 to asst as needed.    This visit: Pt is A&O x4, able to communicate her needs, daughter administers pts meds and gives her a pain pill as instructed by SN prior to wound care being rendered, pt continues to tolerate wound vac well, wound remains stable.    Home health supplies by type and quantity ordered/delivered this visit include:NA    Medications reviewed and all medications are available in the home this visit.    The following education was provided regarding medications: SN Instructed pt and CG not to stop, start or change the way she is currently taking any medications without consulting with the MD    MD notified of any discrepancies/look a-like medications/medication interactions: NA    Medications are effective at this time.     Patient education provided this visit: SEE INTERVENTION LIST    -SN instructed pt/CG to notify home health or physician for the following wound healing complications: increased drainage, pus or a foul odor coming from the wound, fever, muscle, joint, or body aches, sweating, increased swelling, redness or red streaks surrounding the wound; dramatic change in color or size of wound; stitches coming apart or wound reopening; increase in pain at wound site in spite of interventions.    Pt and CG acknowledged understanding of all education provided.     Agency Progress toward goals: Progressing toward established interventions     Patient's Progress towards personal goals: when patient reaches goals and medication is managed, disease processes are understood patient agrees and understand that discharge will take place.

## 2022-03-10 NOTE — Home Health (Signed)
SUBJECTIVE: Pt was getting off of BSC with daughter upon arrival, home is extremely cluttered with minimal room and singular pathway from front door to pt area on couch. Clutter limits pt ability to move at all and is unsafe with RW to move beyond couch. Pts daughter reported poor aide coverage from agency and stated the MSW through medicaid has not been helpful   CAREGIVER ASSISTANCE NEEDED FOR: pts daughter present for visit to assist and for education  MEDICATIONS REVIEWED AND UPDATED: no medication changes reported this visit.  .  OBJECTIVE: see interventions  PATIENT RESPONSE TO TREATMENT: Pt demonstrated positive response to treatment with no increased pain during movement and good participation/compliance with education and recommendations.  Marland Kitchen  PATIENT/CAREGIVER EDUCATION PROVIDED THIS VISIT:  Therapist educated pt and CG on switch to pull ups to increase pt I with LB self-care, recommended raising couch cushion to increase pt I with sit <> stands and encouraged use of hemi technique for UB dressing to avoid knocking off [place] unit.  PATIENT LEVEL OF UNDERSTANDING OF EDUCATION PROVIDED: Pt very receptive to education and recommendations regarding self-care and environment and open to trying with teachback of LB dressing during visit.  ASSESSMENT AND PROGRESS TOWARD GOALS: Pt demonstrated good progress towards ADL, transfer and balance goals, pt is severely limited by cluttered home and motivation to be more I when daughter can do things for her.  PLAN: next visit will be for HEP instruction and further transfer safety training.   DISCHARGE PLANNING DISCUSSED: Discharge to self and family under MD supervision once all goals have been met or patient has reached maximum potential. Frequency remaining: 1w1, 2w1, 1w1 remaining.

## 2022-03-11 ENCOUNTER — Encounter: Admit: 2022-03-11 | Discharge: 2022-03-11 | Payer: PRIVATE HEALTH INSURANCE | Primary: Physician Assistant

## 2022-03-11 ENCOUNTER — Encounter: Payer: PRIVATE HEALTH INSURANCE | Primary: Physician Assistant

## 2022-03-11 NOTE — Home Health (Signed)
SUBJECTIVE: "I think I'm doing much better"  CAREGIVER INVOLVEMENT/ ASSISTANCE NEEDED FOR: Transportation, mobility, ADL's  MEDICATIONS REVIEWED AND UPDATED: Meds reviewed with no changes    WOUNDS: Wound vac is on and working.  Bandage is clean and intact .  Wounds at eval: SN and pt's dau are managing scaral wound.   ROM: B LE's WFL.  ROM at eval: R LE is influenced by increased flexor tone  STRENGTH: Grossly 3+,2+/5.  Strength at eval: L LE: hip 3/5, knee and ankle 3+/5.  R hip 2+/5, knee 3/5, ankle 3+/5.   BED MOBILITY: Pt did not demo this visit.  She is still sleeping on the sofa.  She reports she has been contacted by DME company regarding the hospital bed and her dau is trying to arrange for help to get rid of her current bed and make room for the hospital bed.  PT recommended she contact the local fire dept to see if they can help.  Bed Mobility at eval: Pt is sleeping and staying on her sofa.  She states she cannot safely get to her bedroom due to clutter in the home and also that her bed is not usable. Supine<>sit with MAX A.  Rolling L/R with MAX A.    TRANSFERS: MOD A sit<>stand from sofa.  MIN A sofa<>BSC.  Transfers at eval: MAX A sit<>stand from low sofa.  MOD A sit<>stand from [facility] which is higher. MOD A stand/step sofa<>BSC with FWW.  GAIT: Pt amb in the home with FWW and CG.  Due to the exreme clutter she uses the FWW folded in one hand and holds onto furniture/boxes with the other hand.  PT had her try using a SC and pt said she did not feel as comfortable using the cane. Amb 20' on the sidewalk with fWW and CG.   Gait deficits noted: decreased foot clearance.  Gait at eval: Pt amb a few steps to [facility] with FWW and MOD A.  Gait characterized by flexed trunk, shuffling gait, flexed hips and knees  STAIRS: MIN A up/down 4 steps with rail. Pt uses both hands on the rail, does not want to use a cane.  Stairs at eval: NT  BALANCE:  Tinetti = 12/28 .  Balance at eval: Unable to perform Tinetti as pt is  not amb 10' and cannot stand w/o A.      HEP consisting of:   sitting: hip flex, LAQ, hip abd and heel/toe raises x 10 ea ; standing with walker: heel raises, hip abd, mini squats, marching in place, knee flex x 10 ea        PATIENT/CAREGIVER EDUCATION PROVIDED THIS VISIT: pressure relief, safety, mobility   PATIENT/CAREGIVER RESPONSE TO EDUCATION PROVIDED: Pt/CG verbalizes understanding of all information and demo's back as appropriate   PATIENT RESPONSE TO TREATMENT: Pt demo'd positive response to PT shown by full participation w/o increased pain and with stable vitals    .  ASSESSMENT AND PROGRESS TOWARD GOALS: Pt has made excellent progress toward all goals and has met the goal for stairs.  She is limited by the amount of clutter in the home that leaves only narrow paths to move around.  She had received notification from [facility] that a hospital bed is ready to be delivered but does not have anywhere to put the bed.  PT discussed with pt and her dau the importance of sleeping in a bed instead of on the sofa in order for her wound to heal  and for overall health.  PT suggested they contact the fire dept to see if they can assist with moving furniture and making room for the hospital bed. LPTA to address bed mobility on sofa or hospital bed over the next few visits as pt's dau reports pt needs assist to lift her LE's onto the sofa.  Patient will benefit from continued PT intervention to progress toward meeting all established goals.  HHPT is medically necessary in order to improve functional mobility/quality of life, decrease burden of care, reduce risk for re-hospitalization, work towards patient's personal goals of return to PLOF w decrease risk for falls.      PLAN and DISCHARGE PLANNING DISCUSSED: Continue with progression of mobility, ther ex for strength, ROM, provide education to patient and caregivers to maximize the recovery and reduce the fall risk to progress toward a return to independent function.  Discharge to self and family under MD supervision once all goals have been met or patient has reached max potential. Patient/caregiver verbalized understanding.

## 2022-03-12 ENCOUNTER — Encounter: Admit: 2022-03-12 | Discharge: 2022-03-12 | Payer: PRIVATE HEALTH INSURANCE | Primary: Physician Assistant

## 2022-03-12 NOTE — Home Health (Signed)
SUBJECTIVE: Pt seated on couch upon arrival, pt couch still elevated but pt sitting in crack of coushions instead of up on center making it hard to get up. Pt reported she has not been practicing with standing hourly, getting on BSC every two hours or working on threading pull ups on with reacher to prepare for transition to wearing them full time. Pt saw Dr. Marinus Maw yesterday  CAREGIVER ASSISTANCE NEEDED FOR: pts daughter present during visit for assistance and education, pt had PCA present today which daughter reported will allow her to start clearing out the clutter in pts walkway.  MEDICATIONS REVIEWED AND UPDATED: Cinacalcet 34m, pts daughter reported pt was already taking 1x/day and after yesterdays MD appt they were instructed to start taking 2x/day. This is not in chart and therapist will have supervisor add.  .  OBJECTIVE: see interventions  PATIENT RESPONSE TO TREATMENT: Pt demonstrated positive response to treatment with min increased pain in BLE during transfer and ADL training.  .Marland Kitchen PATIENT/CAREGIVER EDUCATION PROVIDED THIS VISIT:  Therapist reiterated importance of pt carrying over HEP and transfer training every day and not just when therapy present, continuing to perform ADLs at highest possible level the imminent need to clear a safer pathway through extreme clutter in home.  PATIENT LEVEL OF UNDERSTANDING OF EDUCATION PROVIDED: Pt verbalized understanding and daughter stated she has tried to tell her, daughter also reported now that an aide is present daily she should be able to get more done with clutter removal.  ASSESSMENT AND PROGRESS TOWARD GOALS: Pt demonstrated good progress towards ADL, transfer, balance and endurance goal in limited space with use of BSC and FWW with decreased assistance an fatigue reported.  PLAN: next visit will be for continued ADL and transfer training with use of LHAD and environmental modifications.   DISCHARGE PLANNING DISCUSSED: Discharge to self and family under  MD supervision once all goals have been met or patient has reached maximum potential. Frequency remaining: 2w1, 1w1 remaining.

## 2022-03-13 ENCOUNTER — Encounter: Admit: 2022-03-13 | Discharge: 2022-03-13 | Payer: PRIVATE HEALTH INSURANCE | Primary: Physician Assistant

## 2022-03-13 NOTE — Home Health (Addendum)
SUBJECTIVE: Pt reports her daughter is trying to coordinate people to remove bed from bedroom and clear clutter for pathway to have hospital bed delivered and set up in home.  CAREGIVER INVOLVEMENT/ASSISTANCE NEEDED FOR:pts dtr assist pt with all needs prn but not present during PT session.  Pts paid caregiver present during PT session.  OBJECTIVE: See interventions.  PATIENT EDUCATION PROVIDED THIS VISIT: Pt instructed to continue HEP to BLEs in sitting and standing with walker and assist 2x/day and amb 6x/day with FWW and assist.  PATIENT RESPONSE TO EDUCATION PROVIDED: Pt reports she has been doing her sitting exercises and does walk sometimes during the day.  PATIENT RESPONSE TO TREATMENT: Pt requires frequent rest breaks throughout PT session and displays frequent stall tactics/easily distracted requiring constant cuing to re direct pt to task.  .  ASSESSMENT OF PROGRESS TOWARD GOALS: Pt continues to require frequent rest breaks throughout PT session and easily distracted.  Pt has much difficulty bring legs on/off couch from sit to sup but was able to with using strap to assist LLE on couch.  Pt unable to may in supine secondary to sacral wound.  Marland Kitchen  PLAN FOR NEXT VISIT: Will plan to attempt bed mobility next visit.  THE FOLLOWING DISCHARGE PLANNING WAS DISCUSSED WITH THE PATIENT/CAREGIVER: Pt is scheduled to continue PT 2w1, 1w1.

## 2022-03-13 NOTE — Home Health (Signed)
Skilled reason for visit: Wound care    Caregiver involvement: Pt resides in single story home with her daughter, amb with use of a walker, daughter is pts CG and assts with ADL's, medication, appts and transportation and is available 24/7 to asst as needed.    This visit: Pt is A&O x4, able to communicate her needs, daughter administers pts meds and gives her a pain pill as instructed by SN prior to wound care being rendered, pt continues to tolerate wound vac well, wound continues to reduce in size and depth.    Home health supplies by type and quantity ordered/delivered this visit include:NA    Medications reviewed and all medications are available in the home this visit.    The following education was provided regarding medications: SN Instructed pt and CG not to stop, start or change the way she is currently taking any medications without consulting with the MD    MD notified of any discrepancies/look a-like medications/medication interactions: NA    Medications are effective at this time.     Patient education provided this visit: SEE INTERVENTION LIST    -SN instructed pt/CG to notify home health or physician for the following wound healing complications: increased drainage, pus or a foul odor coming from the wound, fever, muscle, joint, or body aches, sweating, increased swelling, redness or red streaks surrounding the wound; dramatic change in color or size of wound; stitches coming apart or wound reopening; increase in pain at wound site in spite of interventions.    Pt and CG acknowledged understanding of all education provided.     Agency Progress toward goals: Progressing toward established interventions     Patient's Progress towards personal goals: when patient reaches goals and medication is managed, disease processes are understood patient agrees and understand that discharge will take place.

## 2022-03-14 ENCOUNTER — Encounter: Payer: PRIVATE HEALTH INSURANCE | Primary: Physician Assistant

## 2022-03-17 ENCOUNTER — Encounter: Admit: 2022-03-17 | Discharge: 2022-03-17 | Payer: PRIVATE HEALTH INSURANCE | Primary: Physician Assistant

## 2022-03-17 ENCOUNTER — Encounter: Payer: PRIVATE HEALTH INSURANCE | Primary: Physician Assistant

## 2022-03-17 NOTE — Home Health (Signed)
SUBJECTIVE: Pt seated on BSC upon arrival, reported she had a large number of BMs throughout the night and is staying on [facility] for now because she feels she may have to go again.  CAREGIVER ASSISTANCE NEEDED FOR: pts daughter present during visit for assistance and education  MEDICATIONS REVIEWED AND UPDATED: no medication changes reported this visit  .  OBJECTIVE: see interventions  PATIENT RESPONSE TO TREATMENT: Pt demonstrated positive response to treatment with no increased pain and good participation with limited space in home.  Marland Kitchen  PATIENT/CAREGIVER EDUCATION PROVIDED THIS VISIT:  Therapist educated pt on ways to upgrade HEP, importance of continuing to perform daily as instructed and working on reducing clutter in home to progress with her mobility outside of limited area near couch.  PATIENT LEVEL OF UNDERSTANDING OF EDUCATION PROVIDED: Pt verbalized good understanding and able to teachback HEP using worksheet as guide and verbalized some improvement with carry over, pt increased I with toileting transfer and clothing management.  ASSESSMENT AND PROGRESS TOWARD GOALS: Pt demonstrated good progress towards transfer, self-care and strengthening goals with decreased assistance and v/c.  PLAN: next visit will be for final ADL assessment at [facility] level for bathing/toileting/dressing teachback with use of educated techniques.   DISCHARGE PLANNING DISCUSSED: Discharge to self and family under MD supervision once all goals have been met or patient has reached maximum potential. Frequency remaining: 1w2 remaining.

## 2022-03-18 ENCOUNTER — Encounter: Admit: 2022-03-18 | Discharge: 2022-03-18 | Payer: PRIVATE HEALTH INSURANCE | Primary: Physician Assistant

## 2022-03-18 ENCOUNTER — Encounter: Payer: PRIVATE HEALTH INSURANCE | Primary: Physician Assistant

## 2022-03-18 ENCOUNTER — Other Ambulatory Visit: Payer: Self-pay | Admitting: Student

## 2022-03-18 DIAGNOSIS — Z8639 Personal history of other endocrine, nutritional and metabolic disease: Secondary | ICD-10-CM

## 2022-03-18 DIAGNOSIS — I5042 Chronic combined systolic (congestive) and diastolic (congestive) heart failure: Secondary | ICD-10-CM

## 2022-03-18 NOTE — Home Health (Signed)
Skilled reason for visit: Wound care    Caregiver involvement: Pt resides in single story home with her daughter, amb with use of a walker, daughter is pts CG and assts with ADL's, medication, appts and transportation and is available 24/7 to asst as needed.    This visit: Pt is A&O x4, able to communicate her needs, daughter administers pts meds and gives her a pain pill as instructed by SN prior to wound care being rendered but stated today that pt has only 2 pai pills remaining, SN encouraged her to contact the MD office for a new Rx to be sent to the pharmacy, pt continues to tolerate wound vac well, wound continues to reduce in size and depth.    Home health supplies by type and quantity ordered/delivered this visit include:NA    Medications reviewed and all medications are available in the home this visit.    The following education was provided regarding medications: SN Instructed pt and CG not to stop, start or change the way she is currently taking any medications without consulting with the MD    MD notified of any discrepancies/look a-like medications/medication interactions: NA    Medications are effective at this time.     Patient education provided this visit: SEE INTERVENTION LIST    -SN instructed pt/CG to notify home health or physician for the following wound healing complications: increased drainage, pus or a foul odor coming from the wound, fever, muscle, joint, or body aches, sweating, increased swelling, redness or red streaks surrounding the wound; dramatic change in color or size of wound; stitches coming apart or wound reopening; increase in pain at wound site in spite of interventions.    Pt and CG acknowledged understanding of all education provided.     Agency Progress toward goals: Progressing toward established interventions     Patient's Progress towards personal goals: when patient reaches goals and medication is managed, disease processes are understood patient agrees and understand  that discharge will take place.

## 2022-03-18 NOTE — Home Health (Signed)
SUBJECTIVE: Pt reports she is very tired and stiff today.  Pt reports she cannot move much today secondary to increased edema in LEs.  CAREGIVER INVOLVEMENT/ASSISTANCE NEEDED FOR: pt has paid caregiver during the day and present during PT session.  Pts dtr assist pt in evening and AM before caregiver arrives.  .  OBJECTIVE: See interventions.  PATIENT EDUCATION PROVIDED THIS VISIT: Encouraged pt to wear compression stocking to BLEs when out of bed and keep LEs elevated when not active to decrease edema in LEs.  Pt to amb with caregiver using FWW > 6x/day and to stand every 2 hrs for pressure relief on buttocks.  PATIENT RESPONSE TO EDUCATION PROVIDED: Pt reports a good compliance with HEP but has not been wearing stocking secondary to pts dtr does not give them to her daily for aide to assist putting them on.  PATIENT RESPONSE TO TREATMENT: Pt had much difficulty bringing LEs on/off couch secondary to stiffness and pain in LEs.  Pt was not wearing compression stockings.  .  ASSESSMENT OF PROGRESS TOWARD GOALS: Reviewed with pt importance of wearing compression stockings and keeping LEs elevated when not active to decrease edema in LEs.  Pt had much difficulty with bed mobility bringing LEs on couch.  Pts dtr has not yet cleared space for hospital bed or path from front door to bedroom to transport bed to pts room but reports she is looking for help.  Marland Kitchen  PLAN FOR NEXT VISIT: Will plan to focus on bed mobility again on couch next visit unless bed is available.  THE FOLLOWING DISCHARGE PLANNING WAS DISCUSSED WITH THE PATIENT/CAREGIVER: Pt is scheduled to continue PT 1 more visit this week then 2w1 next week with dc at end of week and with nsg, caregivers and HEP.

## 2022-03-19 ENCOUNTER — Encounter: Admit: 2022-03-19 | Discharge: 2022-03-19 | Payer: PRIVATE HEALTH INSURANCE | Primary: Physician Assistant

## 2022-03-19 NOTE — Telephone Encounter (Signed)
Patient's daughter Raziah Funnell requesting refill of pain medication Norco for patient.  Michelle Nasuti is on patient's PHI consent form.  Michelle Nasuti confirmed pharmacy on file [facility] [address]

## 2022-03-19 NOTE — Home Health (Signed)
SUBJECTIVE: Pt in living room upon arrival, she reported she had some increased back pain today frm spasms that began last night and weakness in her legs. Pt still able to partcipate well with therapy and verbalized understanding about need to push through on her good days to keep up with training. Therapist discussed future potential with therapy if pt able to get home decluttered to safely ambulate and access bathroom for further ADL training, pt and CG verbalized understanding and are aware of OTDC next week.  CAREGIVER ASSISTANCE NEEDED FOR: pts PCA and daughter home during visit for assistance as needed  [place]: no medication changes reported this visit  .  OBJECTIVE: see interventions  PATIENT RESPONSE TO TREATMENT: Pt demonstrated positive response to treatment with no increased pain reported during ADL and transfer participation.  Marland Kitchen  PATIENT/CAREGIVER EDUCATION PROVIDED THIS VISIT:  Therapist educated pt and daughter on importance of changing environment and decluttering for increased safety with mobility and bathroom access before OT would be able to progress further with her in the home.  PATIENT LEVEL OF UNDERSTANDING OF EDUCATION PROVIDED: Pt and CG verbalized understanding and plan to work towards that gradually.  ASSESSMENT AND PROGRESS TOWARD GOALS: Pt demonstrated good progress towards ADL, transfer, balance and endurance goals with decreased assistance and good carry over of training.  PLAN: next visit will be for OTDC as pt on track to meet current goals and has understanding of environmental changes needed for therapy to return in the future.   DISCHARGE PLANNING DISCUSSED: Discharge to self and family under MD supervision once all goals have been met or patient has reached maximum potential. Frequency remaining: 1w1 remaining with pt aware of OTDC next week.

## 2022-03-20 ENCOUNTER — Encounter: Payer: PRIVATE HEALTH INSURANCE | Primary: Physician Assistant

## 2022-03-21 ENCOUNTER — Encounter: Admit: 2022-03-21 | Discharge: 2022-03-21 | Payer: PRIVATE HEALTH INSURANCE | Primary: Physician Assistant

## 2022-03-21 ENCOUNTER — Encounter: Payer: PRIVATE HEALTH INSURANCE | Primary: Physician Assistant

## 2022-03-21 NOTE — Telephone Encounter (Signed)
Spoke to the daughter and advised per Dr. Yvone Neu. The daughter questioned about the wound vac and pain.  Placed the daughter on a brief hold to speak with Dr. Yvone Neu.  Dr. Yvone Neu stated that she need to see pain management for the pain.  Returned to the call to see it was disconnected.

## 2022-03-21 NOTE — Telephone Encounter (Signed)
Spoke to Mason regarding the pain medication.  Per Dr. Yvone Neu, she is not able at this time to prescribe pain medication.  The daughter wanted to know what the regulations are for prescribing pain medication. Advised the daughter pain medication is only prescribed for 7 days after surgery per the provider.  Advised that the patient will need to see pain management per Dr. Yvone Neu.  Advised that Dr. Yvone Neu can send in gabapentin and the daughter declined it stating her mother didn't do well on it. Advised that the daughter that the patient can take OTC for the pain.  The daughter stated that it does help either.

## 2022-03-21 NOTE — Home Health (Signed)
Skilled reason for visit: Wound care    Caregiver involvement: Pt resides in single story home with her daughter, amb with use of a walker, daughter is pts CG and assts with ADL's, medication, appts and transportation and is available 24/7 to asst as needed.    This visit: Pt is A&O x4, able to communicate her needs, daughter administers pts meds and gives her a pain pill as instructed by SN prior to wound care being rendered, pt continues to tolerate wound vac well, wound continues to reduce in size and depth. Pts daughter was able to contact MD for Rx renewal.    Home health supplies by type and quantity ordered/delivered this visit include:NA    Medications reviewed and all medications are available in the home this visit.    The following education was provided regarding medications: SN Instructed pt and CG not to stop, start or change the way she is currently taking any medications without consulting with the MD    MD notified of any discrepancies/look a-like medications/medication interactions: NA    Medications are effective at this time.     Patient education provided this visit: SEE INTERVENTION LIST    -SN instructed pt/CG to notify home health or physician for the following wound healing complications: increased drainage, pus or a foul odor coming from the wound, fever, muscle, joint, or body aches, sweating, increased swelling, redness or red streaks surrounding the wound; dramatic change in color or size of wound; stitches coming apart or wound reopening; increase in pain at wound site in spite of interventions.    Pt and CG acknowledged understanding of all education provided.     Agency Progress toward goals: Progressing toward established interventions     Patient's Progress towards personal goals: when patient reaches goals and medication is managed, disease processes are understood patient agrees and understand that discharge will take place.

## 2022-03-24 ENCOUNTER — Encounter: Admit: 2022-03-24 | Discharge: 2022-03-24 | Payer: PRIVATE HEALTH INSURANCE | Primary: Physician Assistant

## 2022-03-24 NOTE — Home Health (Signed)
SUBJECTIVE: Pt reports she is doing better today secondary to decreased edema in LEs.  Pt reports she has been wearing her compression stockings more often.  CAREGIVER INVOLVEMENT/ASSISTANCE NEEDED FOR: Pt has paid caregiver with pt during the day M-F usually but she was not present today.  Pts dtr present and assist pt with all needs prn.  .  OBJECTIVE: See interventions.  PATIENT EDUCATION PROVIDED THIS VISIT: Pt instructed to continue HEP 2x/day to BLES in sitting/standing with walker.  Pt to amb > 6x/day  PATIENT RESPONSE TO EDUCATION PROVIDED:   PATIENT RESPONSE TO TREATMENT: Pt demonstrates a good understanding of HEP with cuing for increasing posture.  Pt amb in home with S to SBA secondary to cluttered home and narrow pathways.  .  ASSESSMENT OF PROGRESS TOWARD GOALS:   Bed mobility:pt is S with UE support with all bed mobility which demonstrates an improvement from initial evaluation of max assist.  This allows pt to be more independent.  Transfers: Pt is SB with all transfers which demonstrates an improvement from initial evaluation of mod/max assist.  This allows the pt increased functional independence and mobilty.  Gait/wc mobility:Pt is able to amb  up to 100 feet outside over even/uneven surfaces with SB assist with FWW AD and S indoors-pt folds walker and reaches out for boxes/furniture in narrow, cluttered pathways.  This represents an improvement from initial evaluation of few steps  with FWW AD on level surfaces with mod assist.  Stairs:Pt amb up/down steps side stepping with both hands on one HR and SBA with assist to manage walker as of 03/11/22.  Balance:Pts final Tinetti score is12/28 as of 03/11/22.  FSTS 28 seconds from [facility] with UE support.  THE FOLLOWING DISCHARGE PLANNING WAS DISCUSSED WITH THE PATIENT/CAREGIVER:Pt is scheduled for dc from [facility] PT services with HEP,  caregivers and nsg.

## 2022-03-24 NOTE — Home Health (Signed)
Skilled reason for visit: Wound care    Caregiver involvement: Pt resides in single story home with her daughter, amb with use of a walker, daughter is pts CG and assts with ADL's, medication, appts and transportation and is available 24/7 to asst as needed.    This visit: Pt is A&O x4, able to communicate her needs, daughter administers pts meds and gives her a pain pill as instructed by SN prior to wound care being rendered, pt continues to tolerate wound vac well, wound continues to reduce in size and depth. SN instructed CG in ordering more wound vac supplies.    Home health supplies by type and quantity ordered/delivered this visit include:NA    Medications reviewed and all medications are available in the home this visit.    The following education was provided regarding medications: SN Instructed pt and CG not to stop, start or change the way she is currently taking any medications without consulting with the MD    MD notified of any discrepancies/look a-like medications/medication interactions: NA    Medications are effective at this time.     Patient education provided this visit: SEE INTERVENTION LIST    -SN instructed pt/CG to notify home health or physician for the following wound healing complications: increased drainage, pus or a foul odor coming from the wound, fever, muscle, joint, or body aches, sweating, increased swelling, redness or red streaks surrounding the wound; dramatic change in color or size of wound; stitches coming apart or wound reopening; increase in pain at wound site in spite of interventions.    Pt and CG acknowledged understanding of all education provided.     Patient's Progress towards personal goals: when patient reaches goals and medication is managed, disease processes are understood patient agrees and understand that discharge will take place.

## 2022-03-24 NOTE — Home Health (Signed)
Pt to be discharged this visit as she has met all her goals in the confined space she has in living room due to immense clutter in home. Family aware of environmental changes needed for pt to continue with therapy. See DC summary for details.

## 2022-03-25 ENCOUNTER — Encounter: Payer: PRIVATE HEALTH INSURANCE | Primary: Physician Assistant

## 2022-03-26 ENCOUNTER — Encounter: Admit: 2022-03-26 | Discharge: 2022-03-26 | Payer: PRIVATE HEALTH INSURANCE | Primary: Physician Assistant

## 2022-03-26 NOTE — Home Health (Signed)
SUBJECTIVE: I am doing ok today  REQUIRES CAREGIVER ASSISTANCE FOR: transportation, IADS, ADLS, medications   MEDICATIONS REVIEWED AND RECONCILED no changes   NEXT MD APPT: Ignacia Palma, PA next month   ROM:B LE WFL  STRENGTH: B hip flexors, quads hamstrings DF/PF 4/5 FSTS 28 seconds from [facility] with UE support.  WOUNDS:wound vac is intact on her scaral area   BED MOBILITY:supine<>sit on couch MOD I increased time needed to complete task. Pt is currently still sleeping on the couch. Pt and daughter report that they have gotten the hospital bed. They daughter just needs help removing clutter so it can be brought in home.  TRANSFERS:sit to stand from couch x 3 with B UE support for push off with S. sit to stand from [facility] x 7 with B UE support for push off with S. stand piviot transfer couch <> BSC with RW with S  STAIRS: Per PTA visit 03/11/22 Pt amb up/down steps side stepping with both hands on one HR and SBA with assist to manage walker as of 03/11/22  GAIT Pt amb 50 ft x 2 with RW with S on flat level surfaces.Pt amb with reciprocal gait pattern for short distances and B feet did clear the floor during swing phase of gait.  Pt walker does not fit through all areas of the home so she has to turn the walker sideways secondary to clutter. Pt tends to reach out to move clutter out of the way so walker can fit through narrow pathways, which we discussed was a safety concern. Pt verbalized understanding. Per PTA visit 03/21/22 Pt is able to amb  up to 100 feet outside over even/uneven surfaces with SB assist with FWW AD and S indoors-pt folds walker and reaches out for boxes/furniture in narrow, cluttered pathways.  This represents an improvement from initial evaluation of few steps  with FWW AD on level surfaces with mod assist  BALANCE: Pt scored 14/28 on Tinetti Balance Assessment placing pt at high risk for falls.   PATIENT RESPONE TO TX: Pt pain level remained the same   PATIENT LEVEL OF UNDERSTANDING OF EDUCATION  PROVIDED Pt demonstrated use of RW at all times when amb for safety   PATIENT EDUCATION PROVIDED THIS VISIT: safety, HEP, walking, deep breathing,       HEP consisting of:  in sitting/standing: sitting:hip flex, LAQ, hip abd and heel/toe raises x 15 ea; standing with walker: heel raises, marching in place, mini squats, hip abd x 15 ea.  Written HEP issued, patient/caregiver verbalized understanding.   Patient is s/p infected sacral decubitis and has been treated for ROM, strengthening, gait training, stair training, HEP training, safety training, and balance training. On Re Assessment 03/11/22  strength was Grossly 3+,2+/5.  Strength at eval: L LE: hip 3/5, knee and ankle 3+/5.  R hip 2+/5, knee 3/5, ankle 3+/5. Today at DC strength is B hip flexors, quads hamstrings DF/PF 4/5 FSTS 28 seconds from [facility] with UE support. On Re Assessment bed mobility was Pt did not demo this visit.  She is still sleeping on the sofa.  She reports she has been contacted by DME company regarding the hospital bed and her dau is trying to arrange for help to get rid of her current bed and make room for the hospital bed.  PT recommended she contact the local fire dept to see if they can help.  Bed Mobility at eval: Pt is sleeping and staying on her sofa.  She states  she cannot safely get to her bedroom due to clutter in the home and also that her bed is not usable. Supine<>sit with MAX A.  Rolling L/R with MAX A..  Today at DC bed mobility is supine<>sit on couch MOD I increased time needed to complete task. Pt is currently still sleeping on the couch. Pt and daughter report that they have gotten the hospital bed. They daughter just needs help removing clutter so it can be brought in home..  On ReAssessment transfers were MOD A sit<>stand from sofa.  MIN A sofa<>BSC.  Transfers at eval: MAX A sit<>stand from low sofa.  MOD A sit<>stand from [facility] which is higher. MOD A stand/step sofa<>BSC with FWW. Today at DC transfers are :sit to stand from  couch x 3 with B UE support for push off with S. sit to stand from [facility] x 7 with B UE support for push off with S. stand piviot transfer couch <> BSC with RW with S. Pt made progress but did not met goal of MOD I with transfers  On Re Assesment  gait was Pt amb in the home with FWW and CG.  Due to the exreme clutter she uses the FWW folded in one hand and holds onto furniture/boxes with the other hand.  PT had her try using a SC and pt said she did not feel as comfortable using the cane. Amb 20' on the sidewalk with fWW and CG.   Gait deficits noted: decreased foot clearance.  Gait at eval: Pt amb a few steps to [facility] with FWW and MOD A.  Gait characterized by flexed trunk, shuffling gait, flexed hips and knees.Today at DC gait is Pt amb 50 ft x 2 with RW with S on flat level surfaces.Pt amb with reciprocal gait pattern for short distances and B feet did clear the floor during swing phase of gait.  Pt walker does not fit through all areas of the home so she has to turn the walker sideways secondary to clutter. Pt tends to reach out to move clutter out of the way so walker can fit through narrow pathways, which we discussed was a safety concern. Pt verbalized understanding. Per PTA visit 03/21/22 Pt is able to amb  up to 100 feet outside over even/uneven surfaces with SB assist with FWW AD and S indoors-pt folds walker and reaches out for boxes/furniture in narrow, cluttered pathways.  This represents an improvement from initial evaluation of few steps  with FWW AD on level surfaces with mod assist. Pt did make progress but did not met goal of MOD I with amb secondary to S with cluttered living environment. On Re Assessment  stairs are MIN A up/down 4 steps with rail. Pt uses both hands on the rail, does not want to use a cane.  Stairs at eval:NT On Re Assessment Tinetti Per PTA visit 03/11/22 Pt amb up/down steps side stepping with both hands on one HR and SBA with assist to manage walker as 03/11/22. Balance at eval:  Unable to perform Tinetti as pt is not amb 10' and cannot stand w/o A. Today at DC pt scored a 14/28 on Tinetti Balance Assessment placing pt as a high  fall risk.. Pt did have more than a 4 point statistical improvement. Pt has made progress and has met all goals.  Discharge plan: Discharge from [facility] services as  goals have been met.  Ignacia Palma, PA office has been notified of DC from [place] with  goals met

## 2022-03-27 NOTE — Home Health (Signed)
Skilled reason for visit: Wound care    Caregiver involvement: Pt resides in single story home with her daughter, amb with use of a walker, daughter is pts CG and assts with ADL's, medication, appts and transportation and is available 24/7 to asst as needed.    This visit: Pt is A&O x4, able to communicate her needs, daughter administers pts meds and gives her a pain pill as instructed by SN prior to wound care being rendered, pt continues to tolerate wound vac well.     Home health supplies by type and quantity ordered/delivered this visit include:NA    Medications reviewed and all medications are available in the home this visit.    The following education was provided regarding medications: SN Instructed pt and CG not to stop, start or change the way she is currently taking any medications without consulting with the MD    MD notified of any discrepancies/look a-like medications/medication interactions: NA    Medications are effective at this time.     Patient education provided this visit: SEE INTERVENTION LIST    -SN instructed pt/CG to notify home health or physician for the following wound healing complications: increased drainage, pus or a foul odor coming from the wound, fever, muscle, joint, or body aches, sweating, increased swelling, redness or red streaks surrounding the wound; dramatic change in color or size of wound; stitches coming apart or wound reopening; increase in pain at wound site in spite of interventions.    Pt and CG acknowledged understanding of all education provided.     Patient's Progress towards personal goals: when patient reaches goals and medication is managed, disease processes are understood patient agrees and understand that discharge will take place.

## 2022-04-01 ENCOUNTER — Encounter: Admit: 2022-04-01 | Discharge: 2022-04-01 | Payer: PRIVATE HEALTH INSURANCE | Primary: Physician Assistant

## 2022-04-01 ENCOUNTER — Ambulatory Visit: Payer: Medicare Other | Admitting: Podiatry

## 2022-04-01 NOTE — Home Health (Signed)
Skilled reason for visit: Wound care    Caregiver involvement: Pt resides in single story home with her daughter, amb with use of a walker, daughter is pts CG and assts with ADL's, medication, appts and transportation and is available 24/7 to asst as needed.    This visit: Pt is A&O x4, able to communicate her needs, daughter assts with all her ADLs,wound vac applied to sacral wound, wound continues to decrease in size and depth, tx in progress continues, pt tolerates wound vac well.     Home health supplies by type and quantity ordered/delivered this visit include:NA    Medications reviewed and all medications are available in the home this visit.    The following education was provided regarding medications: SN Instructed pt and CG not to stop, start or change the way she is currently taking any medications without consulting with the MD    MD notified of any discrepancies/look a-like medications/medication interactions: NA    Medications are effective at this time.     Patient education provided this visit: SEE INTERVENTION LIST    -SN instructed pt/CG to notify home health or physician for the following wound healing complications: increased drainage, pus or a foul odor coming from the wound, fever, muscle, joint, or body aches, sweating, increased swelling, redness or red streaks surrounding the wound; dramatic change in color or size of wound; stitches coming apart or wound reopening; increase in pain at wound site in spite of interventions.    Pt and CG acknowledged understanding of all education provided.     Patient's Progress towards personal goals: when patient reaches goals and medication is managed, disease processes are understood patient agrees and understand that discharge will take place.

## 2022-04-03 ENCOUNTER — Ambulatory Visit (INDEPENDENT_AMBULATORY_CARE_PROVIDER_SITE_OTHER): Payer: Medicare Other

## 2022-04-03 DIAGNOSIS — Z0000 Encounter for general adult medical examination without abnormal findings: Secondary | ICD-10-CM | POA: Diagnosis not present

## 2022-04-03 NOTE — Progress Notes (Signed)
Subjective:   Julie Elliott is a 66 y.o. female who presents for an Initial Medicare Annual Wellness Visit. I connected with  Julie Elliott on 04/16/22 by a audio enabled telemedicine application and verified that I am speaking with the correct person using two identifiers.  Patient Location: Home  Provider Location: Office/Clinic  I discussed the limitations of evaluation and management by telemedicine. The patient expressed understanding and agreed to proceed.  Review of Systems    Defer to PCP       Objective:    There were no vitals filed for this visit. There is no height or weight on file to calculate BMI.     08/20/2021    4:25 PM 03/06/2021    4:01 PM 11/05/2020    3:57 PM 05/07/2020    4:05 PM 11/28/2019    4:46 PM 06/21/2019    3:43 PM 04/27/2018    3:51 PM  Advanced Directives  Does Patient Have a Medical Advance Directive? No No No No No No No  Would patient like information on creating a medical advance directive? No - Patient declined Yes (MAU/Ambulatory/Procedural Areas - Information given) Yes (MAU/Ambulatory/Procedural Areas - Information given) No - Patient declined No - Patient declined No - Patient declined No - Patient declined    Current Medications (verified) Outpatient Encounter Medications as of 04/03/2022  Medication Sig   ammonium lactate (AMLACTIN) 12 % lotion Apply 1 application topically 2 (two) times daily. Apply to feet once daily.   aspirin 81 MG EC tablet Take 1 tablet (81 mg total) by mouth daily.   atorvastatin (LIPITOR) 40 MG tablet Take 1 tablet (40 mg total) by mouth daily.   buPROPion (WELLBUTRIN SR) 150 MG 12 hr tablet Take 1 tablet (150 mg total) by mouth 2 (two) times daily.   clotrimazole-betamethasone (LOTRISONE) cream Apply 1 application topically daily. Apply to both feet twice daily for 2-3 weeks until resolved.   enalapril (VASOTEC) 5 MG tablet Take 1 tablet (5 mg total) by mouth 2 (two) times daily.   furosemide (LASIX) 40 MG tablet  TAKE ONE TABLET BY MOUTH TWICE DAILY   metoprolol succinate (TOPROL-XL) 50 MG 24 hr tablet TAKE 1 TABLET DAILY WITH OR IMMEDIATELY FOLLOWING A MEAL.   potassium chloride SA (KLOR-CON M) 20 MEQ tablet Take 1 tablet (20 mEq total) by mouth daily.   pregabalin (LYRICA) 75 MG capsule TAKE 1 CAPSULE IN THE MORNING, 1 CAPSULE IN THE EVENING AND 2 AT BEDTIME. Please see if primary care will refill this going forward.   spironolactone (ALDACTONE) 25 MG tablet TAKE ONE TABLET BY MOUTH DAILY.   SYNJARDY 5-500 MG TABS TAKE ONE TABLET BY MOUTH TWICE DAILY   No facility-administered encounter medications on file as of 04/03/2022.    Allergies (verified) Loratadine-pseudoephedrine er   History: Past Medical History:  Diagnosis Date   Cervical disc disease with myelopathy    CHF (congestive heart failure) (HCC)    Chronic headaches    DJD (degenerative joint disease)    DM (diabetes mellitus) ([place]) 08/04/2006   stable HgBA1C at 6.5   GERD (gastroesophageal reflux disease)    well controlled on Omeprazole   HLD (hyperlipidemia) 08/04/2006   stable, well controlled   Hypertension    well controlled   Past Surgical History:  Procedure Laterality Date   carpel tunnel Right    20 yrs ago   [place]  2016   TUBAL LIGATION     Family History  Problem  Relation Age of Onset   Diabetes Mother    Heart disease Mother    Prostate cancer Father    Neuropathy Neg Hx    Social History   Socioeconomic History   Marital status: Single    Spouse name: Not on file   Number of children: 3   Years of education: 84   Highest education level: Not on file  Occupational History   Occupation: Unemployed  Tobacco Use   Smoking status: Some Days    Types: Cigarettes    Last attempt to quit: 04/18/2013    Years since quitting: 9.0   Smokeless tobacco: Current   Tobacco comments:    1-2 cigs in the a.m.  Vaping Use   Vaping Use: Never used  Substance and Sexual Activity   Alcohol use:  No    Alcohol/week: 0.0 standard drinks of alcohol   Drug use: No   Sexual activity: Not Currently  Other Topics Concern   Not on file  Social History Narrative   to get supplies nfrom [facility] per pt request.1--[phone]   Lives at home with son.    Right handed.   Caffeine use: drinks coffee-rare   Social Determinants of Health   Financial Resource Strain: Low Risk  (04/03/2022)   Overall Financial Resource Strain (CARDIA)    Difficulty of Paying Living Expenses: Not hard at all  Food Insecurity: No Food Insecurity (04/03/2022)   Hunger Vital Sign    Worried About Running Out of Food in the Last Year: Never true    Ran Out of Food in the Last Year: Never true  Transportation Needs: No Transportation Needs (04/03/2022)   PRAPARE - Hydrologist (Medical): No    Lack of Transportation (Non-Medical): No  Physical Activity: Insufficiently Active (04/03/2022)   Exercise Vital Sign    Days of Exercise per Week: 7 days    Minutes of Exercise per Session: 10 min  Stress: No Stress Concern Present (04/03/2022)   [place]    Feeling of Stress : Not at all  Social Connections: Moderately Isolated (04/03/2022)   Social Connection and Isolation Panel [NHANES]    Frequency of Communication with Friends and Family: More than three times a week    Frequency of Social Gatherings with Friends and Family: Not on file    Attends Religious Services: Never    Marine scientist or Organizations: No    Attends Archivist Meetings: Never    Marital Status: Married    Tobacco Counseling Ready to quit: Not Answered Counseling given: Not Answered Tobacco comments: 1-2 cigs in the a.m.   Clinical Intake:  Pre-visit preparation completed: Yes  Pain : No/denies pain     Nutritional Risks: None Diabetes: Yes  How often do you need to have someone help you when you read instructions,  pamphlets, or other written materials from your doctor or pharmacy?: 1 - Never What is the last grade level you completed in school?: 12th grade  Diabetic?Yes  Interpreter Needed?: No  Information entered by :: Corey Skains [employer]adee [employer],cma 04/03/2022 2:00pm   Activities of Daily Living    08/20/2021    4:24 PM  In your present state of health, do you have any difficulty performing the following activities:  Hearing? 0  Vision? 0  Difficulty concentrating or making decisions? 0  Walking or climbing stairs? 1  Dressing or bathing? 0  Doing errands, shopping? 0  Patient Care Team: Rick Duff, MD as PCP - General Buford Dresser, MD as PCP - Cardiology (Cardiology) Dr. Colin Ina as Consulting Physician (Ophthalmology)  Indicate any recent Medical Services you may have received from other than [facility] providers in the past year (date may be approximate).     Assessment:   This is a routine wellness examination for [place].  Hearing/Vision screen No results found.  Dietary issues and exercise activities discussed:     Goals Addressed   None   Depression Screen    04/03/2022    2:02 PM 08/20/2021    4:25 PM 03/06/2021    3:50 PM 11/05/2020    4:08 PM 05/07/2020    4:50 PM 11/28/2019    4:47 PM 06/21/2019    3:41 PM  PHQ 2/9 Scores  PHQ - 2 Score 0 0 0 0 0 0 3  PHQ- 9 Score    0 0  3    Fall Risk    08/20/2021    4:23 PM 03/06/2021    3:50 PM 11/05/2020    3:58 PM 05/07/2020    4:03 PM 11/28/2019    4:45 PM  [place] in the past year? 1 0 0 0 0  Number falls in past yr: 0    0  Injury with Fall? 0    0  Risk for fall due to :  Impaired balance/gait;Impaired mobility     Follow up  Falls prevention discussed       FALL RISK PREVENTION PERTAINING TO THE HOME:  Any stairs in or around the home? Yes  If so, are there any without handrails? Yes  Home free of loose throw rugs in walkways, pet beds, electrical cords, etc? Yes  Adequate lighting in your home to  reduce risk of falls? Yes   ASSISTIVE DEVICES UTILIZED TO PREVENT FALLS:  Life alert? No  Use of a cane, walker or w/c? Yes  Grab bars in the bathroom? Yes  Shower chair or bench in shower? Yes  Elevated toilet seat or a handicapped toilet? Yes   TIMED UP AND GO:  Was the test performed? No .  Length of time to ambulate 10 feet: N/A sec.   Gait slow and steady with assistive device  Cognitive Function:        04/03/2022    2:03 PM  6CIT Screen  What Year? 0 points  What month? 0 points  What time? 0 points  Count back from 20 0 points  Months in reverse 0 points  Repeat phrase 0 points  Total Score 0 points    Immunizations Immunization History  Administered Date(s) Administered   Influenza-Unspecified 05/04/2018    TDAP status: Due, Education has been provided regarding the importance of this vaccine. Advised may receive this vaccine at local pharmacy or Health Dept. Aware to provide a copy of the vaccination record if obtained from local pharmacy or Health Dept. Verbalized acceptance and understanding.  Flu Vaccine status: Due, Education has been provided regarding the importance of this vaccine. Advised may receive this vaccine at local pharmacy or Health Dept. Aware to provide a copy of the vaccination record if obtained from local pharmacy or Health Dept. Verbalized acceptance and understanding.  Pneumococcal vaccine status: Due, Education has been provided regarding the importance of this vaccine. Advised may receive this vaccine at local pharmacy or Health Dept. Aware to provide a copy of the vaccination record if obtained from local pharmacy or Health Dept.  Verbalized acceptance and understanding.  Covid-19 vaccine status: Information provided on how to obtain vaccines.   Qualifies for Shingles Vaccine? No   Zostavax completed No   Shingrix Completed?: No.    Education has been provided regarding the importance of this vaccine. Patient has been advised to call  insurance company to determine out of pocket expense if they have not yet received this vaccine. Advised may also receive vaccine at local pharmacy or Health Dept. Verbalized acceptance and understanding.  Screening Tests Health Maintenance  Topic Date Due   COVID-19 Vaccine (1) Never done   Zoster Vaccines- Shingrix (1 of 2) Never done   PAP SMEAR-Modifier  11/03/2019   HEMOGLOBIN A1C  02/17/2022   INFLUENZA VACCINE  03/04/2022   Diabetic kidney evaluation - GFR measurement  03/06/2022   MAMMOGRAM  08/20/2022 (Originally 11/12/2016)   COLONOSCOPY (Pts 45-54yr Insurance coverage will need to be confirmed)  08/20/2022 (Originally 07/21/2018)   Diabetic kidney evaluation - Urine ACR  08/20/2022   Hepatitis C Screening  Completed   HIV Screening  Completed   HPV VACCINES  Aged Out   FOOT EXAM  Discontinued   OPHTHALMOLOGY EXAM  Discontinued    Health Maintenance  Health Maintenance Due  Topic Date Due   COVID-19 Vaccine (1) Never done   Zoster Vaccines- Shingrix (1 of 2) Never done   PAP SMEAR-Modifier  11/03/2019   HEMOGLOBIN A1C  02/17/2022   INFLUENZA VACCINE  03/04/2022   Diabetic kidney evaluation - GFR measurement  03/06/2022    Colorectal cancer screening: Type of screening: Colonoscopy. Completed 07/21/2008. Repeat every 10 years  Mammogram status: Completed 11/13/2014. Repeat every year:2    Lung Cancer Screening: (Low Dose CT Chest recommended if Age 64-80years, 30 pack-year currently smoking OR have quit w/in 15years.) does not qualify.   Lung Cancer Screening Referral: N/A  Additional Screening:  Hepatitis C Screening: does not qualify; Completed 12/21/2014  Vision Screening: Recommended annual ophthalmology exams for early detection of glaucoma and other disorders of the eye. Is the patient up to date with their annual eye exam?  No  Who is the provider or what is the name of the office in which the patient attends annual eye exams?  If pt is not  established with a provider, would they like to be referred to a provider to establish care? No .   Dental Screening: Recommended annual dental exams for proper oral hygiene  Community Resource Referral / Chronic Care Management: CRR required this visit?  No   CCM required this visit?  No      Plan:     I have personally reviewed and noted the following in the patient's chart:   Medical and social history Use of alcohol, tobacco or illicit drugs  Current medications and supplements including opioid prescriptions. Patient is not currently taking opioid prescriptions. Functional ability and status Nutritional status Physical activity Advanced directives List of other physicians Hospitalizations, surgeries, and ER visits in previous 12 months Vitals Screenings to include cognitive, depression, and falls Referrals and appointments  In addition, I have reviewed and discussed with patient certain preventive protocols, quality metrics, and best practice recommendations. A written personalized care plan for preventive services as well as general preventive health recommendations were provided to patient.     JKerin Perna [facility]  04/16/2022   Nurse Notes: Non Face-To-Face 7 minute visit  Ms. WOdle, Thank you for taking time to come for your Medicare Wellness Visit. I appreciate your  ongoing commitment to your health goals. Please review the following plan we discussed and let me know if I can assist you in the future.   These are the goals we discussed:  Goals      Blood Pressure < 140/90     HEMOGLOBIN A1C < 7.0     LDL CALC < 100     Quit Smoking        This is a list of the screening recommended for you and due dates:  Health Maintenance  Topic Date Due   COVID-19 Vaccine (1) Never done   Zoster (Shingles) Vaccine (1 of 2) Never done   Pap Smear  11/03/2019   Hemoglobin A1C  02/17/2022   Flu Shot  03/04/2022   Yearly kidney function blood test for diabetes   03/06/2022   Mammogram  08/20/2022*   Colon Cancer Screening  08/20/2022*   Yearly kidney health urinalysis for diabetes  08/20/2022   Hepatitis C Screening: USPSTF Recommendation to screen - Ages 18-79 yo.  Completed   HIV Screening  Completed   HPV Vaccine  Aged Out   Complete foot exam   Discontinued   Eye exam for diabetics  Discontinued  *Topic was postponed. The date shown is not the original due date.

## 2022-04-04 NOTE — Telephone Encounter (Signed)
Patient's daughter Deshon Koslowski states patient need a new order for Home Health visits/wound care.  Michelle Nasuti states today  is the last day for Home health visit but patient still needs a couple of more visits for wound care.  I advised Michelle Nasuti I will forward message to Dr. Yvone Neu and Nurse.  Michelle Nasuti verbalized understanding.

## 2022-04-04 NOTE — Home Health (Signed)
Skilled reason for visit: Wound care    Caregiver involvement: Pt resides in single story home with her daughter, amb with use of a walker, daughter is pts CG and assts with ADL's, medication, appts and transportation and is available 24/7 to asst as needed.    This visit: Pt is A&O x4, able to communicate her needs, daughter assts with all her ADLs,wound vac applied to sacral wound, wound continues to decrease in size and depth, tx in progress continues, wound vac continues to operate w/o difficulty.    Home health supplies by type and quantity ordered/delivered this visit include:NA    Medications reviewed and all medications are available in the home this visit.    The following education was provided regarding medications: SN Instructed pt and CG not to stop, start or change the way she is currently taking any medications without consulting with the MD    MD notified of any discrepancies/look a-like medications/medication interactions: NA    Medications are effective at this time.     Patient education provided this visit: SEE INTERVENTION LIST    -SN instructed pt/CG to notify home health or physician for the following wound healing complications: increased drainage, pus or a foul odor coming from the wound, fever, muscle, joint, or body aches, sweating, increased swelling, redness or red streaks surrounding the wound; dramatic change in color or size of wound; stitches coming apart or wound reopening; increase in pain at wound site in spite of interventions.    Pt and CG acknowledged understanding of all education provided.     Patient's Progress towards personal goals: when patient reaches goals and medication is managed, disease processes are understood patient agrees and understand that discharge will take place.

## 2022-04-07 ENCOUNTER — Encounter: Admit: 2022-04-07 | Discharge: 2022-04-07 | Payer: PRIVATE HEALTH INSURANCE | Primary: Physician Assistant

## 2022-04-07 NOTE — Home Health (Addendum)
 Physician Notification and Justification to Continue Services:     Justification for continued intermittent care: patient with wound care concerns as follows slowly healing sacral wound     [name] Shark Deatherage, APRN - NP notified of the following: the need for continued Skilled Nursing home health services for above reasons, plan of care including visit frequency of 3w8, 1w1 Skilled Nursing for : additional assessment and WOUND FOLEY IV ETC: wound care services to sacral wound.    Skilled reason for visit: body systems assess, teach disease process and medication management, wound care, reassess for recert to continue SN services  Picture was taken this visit and is located in media section.  Caregiver involvement: family assists with adl's and iadl's prn, daughter independent with applying backup wound dressing.    Medications reviewed and all medications are available in the home this visit.    The following education was provided regarding medications: take medications as ordered, side effects, dosages, purposes, frequencies, do not stop or start any medications without notifying MD, obtain refills prn.    MD notified of any discrepancies/look a-like medications/medication interactions: n/a  Medications are effective at this time.      Home health supplies by type and quantity ordered/delivered this visit include: n/a    Patient education provided this visit: as above, INSTRUCTED PATIENT AND CG THAT SHOULD ANY NEEDS OR CONCERNS ARISE TO FIRST CALL OUR OFFICE, INCLUDING FOR WOUND VAC COMPLICATIONS, OR THE DR'S OFFICE OR GO TO AN URGENT CARE CENTER AND NOT TO THE ED FOR NON-LIFE THREATENING EVENTS. IF IT IS LIFE THREATENING THEN CALL 911 OR GO TO THE CLOSEST ER, see care plan.    Sharps education provided: Writer.    Patient level of understanding of education provided: partial understanding and teach back    Skilled Care Performed this visit: VS, body systems assessment, teach disease process and  medication management, wound care/ reapplied wound vac dressing, wound vac patent at upon RN departure.    Patient response to procedure performed: receptive to teaching, tolerated assess and teaching without adverse effects noted, patient continent of bladder and bowel during SN visit, prior to wound vac dressing application.    Agency Progress toward goals: progressing, see care plan    Patient's Progress towards personal goals: progressing, see care plan, patient given opportunity to voice questions and/ or concerns. Patient states has no questions or concerns this visit.  Home exercise program: take medications as ordered, follow ordered diet, follow through with HEP and use assistive devices as instructed by PT    Continued need for the following skills: Nursing.    Plan for next visit: body systems assess, teach disease process and medication management, wound care    Patient and/or caregiver notified and agrees to changes in the Plan of Care: N/A.     The following discharge planning was discussed with the pt/caregiver: DC to self and family under MD supervision when all goals are met or maximum potential is reached. Pt did verbalize understanding of DC planning.

## 2022-04-08 NOTE — Home Health (Signed)
SN received a call from pts CG/daughter stating that the wound vac was alarming, SN went to trouble shoot wound vac. At check pt had a large amt of fecal matter in drape. SN removed existing drape, cleansed wound and reapplied drape and vac. Vac operating well w/o problems.

## 2022-04-09 ENCOUNTER — Encounter: Admit: 2022-04-09 | Discharge: 2022-04-09 | Payer: PRIVATE HEALTH INSURANCE | Primary: Physician Assistant

## 2022-04-09 NOTE — Home Health (Signed)
Skilled reason for visit: Wound care    Caregiver involvement: Pt resides in single story home with her daughter, amb with use of a walker, daughter is pts CG and assts with ADL's, medication, appts and transportation and is available 24/7 to asst as needed.    This visit: Pt is A&O x4, with some periods of forgetfulness, pt is able to communicate her needs, daughter assts with all her ADLs, no wound care performed today, just a follow up visit, Wound vac noted to be functioning properly. Pts V/S WNL.    Home health supplies by type and quantity ordered/delivered this visit include:NA    Medications reviewed and all medications are available in the home this visit.    The following education was provided regarding medications: SN Instructed pt and CG not to stop, start or change the way she is currently taking any medications without consulting with the MD    MD notified of any discrepancies/look a-like medications/medication interactions: NA    Medications are effective at this time.     Patient education provided this visit: SEE INTERVENTION LIST    -SN instructed pt/CG to notify home health or physician for the following wound healing complications: increased drainage, pus or a foul odor coming from the wound, fever, muscle, joint, or body aches, sweating, increased swelling, redness or red streaks surrounding the wound; dramatic change in color or size of wound; stitches coming apart or wound reopening; increase in pain at wound site in spite of interventions.    Pt and CG acknowledged understanding of all education provided.     Patient's Progress towards personal goals: when patient reaches goals and medication is managed, disease processes are understood patient agrees and understand that discharge will take place.

## 2022-04-11 ENCOUNTER — Encounter: Admit: 2022-04-11 | Discharge: 2022-04-11 | Payer: PRIVATE HEALTH INSURANCE | Primary: Physician Assistant

## 2022-04-11 NOTE — Home Health (Signed)
Skilled reason for visit: Wound care    Caregiver involvement: Pt resides in single story home with her daughter, amb with use of a walker, daughter is pts CG and assts with ADL's, medication, appts and transportation and is available 24/7 to asst as needed.    This visit: Pt is A&O x4, with some periods of forgetfulness, pt is able to communicate her needs, daughter assts with all her ADLs, wound care performed as ordered, pt tolerated well, wound remains stable and continues to decrease in size and drainage amounts,     Home health supplies by type and quantity ordered/delivered this visit include:CG stated wound vac supplies ordered have been received    Medications reviewed and all medications are available in the home this visit.    The following education was provided regarding medications: SN Instructed pt and CG not to stop, start or change the way she is currently taking any medications without consulting with the MD    MD notified of any discrepancies/look a-like medications/medication interactions: NA    Medications are effective at this time.     Patient education provided this visit: SEE INTERVENTION LIST    -SN instructed pt/CG to notify home health or physician for the following wound healing complications: increased drainage, pus or a foul odor coming from the wound, fever, muscle, joint, or body aches, sweating, increased swelling, redness or red streaks surrounding the wound; dramatic change in color or size of wound; stitches coming apart or wound reopening; increase in pain at wound site in spite of interventions.    Pt and CG acknowledged understanding of all education provided.     Patient's Progress towards personal goals: when patient reaches goals and medication is managed, disease processes are understood patient agrees and understand that discharge will take place.

## 2022-04-14 ENCOUNTER — Encounter: Admit: 2022-04-14 | Discharge: 2022-04-14 | Payer: PRIVATE HEALTH INSURANCE | Primary: Physician Assistant

## 2022-04-14 NOTE — Home Health (Signed)
Skilled reason for visit: Wound care    Caregiver involvement: Pt resides in single story home with her daughter, amb with use of a walker, daughter is pts CG and assts with ADL's, medication, appts and transportation and is available 24/7 to asst as needed.    This visit: Pt is A&O x4, with some periods of forgetfulness, pt is able to communicate her needs, daughter assts with all her ADLs, wound care performed as ordered, pt tolerated well, wound remains stable and continues to decrease in size and drainage amount.     Home health supplies by type and quantity ordered/delivered this visit include:NA    Medications reviewed and all medications are available in the home this visit.    The following education was provided regarding medications: SN Instructed pt and CG not to stop, start or change the way she is currently taking any medications without consulting with the MD    MD notified of any discrepancies/look a-like medications/medication interactions: NA    Medications are effective at this time.     Patient education provided this visit: SEE INTERVENTION LIST    -SN instructed pt/CG to notify home health or physician for the following wound healing complications: increased drainage, pus or a foul odor coming from the wound, fever, muscle, joint, or body aches, sweating, increased swelling, redness or red streaks surrounding the wound; dramatic change in color or size of wound; stitches coming apart or wound reopening; increase in pain at wound site in spite of interventions.    Pt and CG acknowledged understanding of all education provided.     Patient's Progress towards personal goals: when patient reaches goals and medication is managed, disease processes are understood patient agrees and understand that discharge will take place.

## 2022-04-16 ENCOUNTER — Encounter: Admit: 2022-04-16 | Discharge: 2022-04-16 | Payer: PRIVATE HEALTH INSURANCE | Primary: Physician Assistant

## 2022-04-16 ENCOUNTER — Ambulatory Visit
Admit: 2022-04-16 | Discharge: 2022-04-16 | Payer: PRIVATE HEALTH INSURANCE | Attending: Surgery | Primary: Physician Assistant

## 2022-04-16 DIAGNOSIS — L89154 Pressure ulcer of sacral region, stage 4: Secondary | ICD-10-CM

## 2022-04-16 NOTE — Home Health (Signed)
Skilled reason for visit: Wound care    Caregiver involvement: Pt resides in single story home with her daughter, amb with use of a walker, daughter is pts CG and assts with ADL's, medication, appts and transportation and is available 24/7 to asst as needed.    This visit: Pt is A&O x4, with some periods of forgetfulness, pt is able to communicate her needs, daughter assts with all her ADLs, wound care performed as ordered, pt tolerated well, wound remains stable. Pt had an appt with the wound MD yesterday, per CG MD stated wound looks good, is healing well. Tx in progress continues.    Home health supplies by type and quantity ordered/delivered this visit include:NA    Medications reviewed and all medications are available in the home this visit.    The following education was provided regarding medications: SN Instructed pt and CG not to stop, start or change the way she is currently taking any medications without consulting with the MD    MD notified of any discrepancies/look a-like medications/medication interactions: NA    Medications are effective at this time.     Patient education provided this visit: SEE INTERVENTION LIST    -SN instructed pt/CG to notify home health or physician for the following wound healing complications: increased drainage, pus or a foul odor coming from the wound, fever, muscle, joint, or body aches, sweating, increased swelling, redness or red streaks surrounding the wound; dramatic change in color or size of wound; stitches coming apart or wound reopening; increase in pain at wound site in spite of interventions.    Pt and CG acknowledged understanding of all education provided.     Patient's Progress towards personal goals: when patient reaches goals and medication is managed, disease processes are understood patient agrees and understand that discharge will take place.

## 2022-04-17 NOTE — Progress Notes (Signed)
CC:   Chief Complaint   Patient presents with    Follow-up     Sacral decubitus ulcer,         Assessment:    ICD-10-CM    1. Sacral decubitus ulcer, stage IV (HCC)  L89.154           Plan: The wound is healing very nicely with the wound vac. A wet to dry dressing was applied today in the clinic. She should continue with wound vac therapy changes 3 times a week. Continue to offload every 2 hours. I encouraged high protein diet and the importance of good nutrition. I would like to see her back in 3 months or sooner should she have any questions or concerns. She agrees with this plan.         HPI:  [name] Short is a 66 y.o. female who is here today for wound check. She reports no issues with vac therapy and states tomorrow home nursing was planning on changing the wound vac. She has no fevers or chills. Pain is controlled. Overall she is feeling well today with no complaints.     Allergies:  Allergies   Allergen Reactions    Lactose Other (See Comments)     Intolerance       Medication Review:  Current Outpatient Medications on File Prior to Visit   Medication Sig Dispense Refill    baclofen (LIORESAL) 10 MG tablet Take 1 tablet by mouth 3 times daily as needed (PAIN) Indications: Pain FOR PAIN PER SHETH      Magnesium Oxide 400 MG CAPS Take 1 capsule by mouth daily.      cinacalcet (SENSIPAR) 30 MG tablet Take 1 tablet by mouth in the morning and 1 tablet in the evening.      aspirin 325 MG tablet Take 1 tablet by mouth daily      acetaminophen (TYLENOL) 500 MG tablet Take 1 tablet by mouth every 6 hours as needed for Fever or Pain 30 tablet 0    insulin glargine (LANTUS) 100 UNIT/ML injection vial Inject 34 Units into the skin daily 10 mL 0    insulin lispro (HUMALOG) 100 UNIT/ML SOLN injection vial Inject 4 Units into the skin 3 times daily (with meals) 1 each 0    insulin lispro (HUMALOG) 100 UNIT/ML SOLN injection vial Sliding Scale insulin as per Institutional protocol 1 each 0    lactobacillus delayed release  capsule Take 1 capsule by mouth daily (with breakfast) 15 capsule 0    hydrALAZINE (APRESOLINE) 10 MG tablet Take 1 tablet by mouth every 6 hours as needed (As needed for systolic blood pressure greater than 160) 90 tablet 0    atorvastatin (LIPITOR) 80 MG tablet Take 1 tablet by mouth nightly 30 tablet 3    ferrous sulfate (IRON 325) 325 (65 Fe) MG tablet Take 1 tablet by mouth daily 30 tablet     Multiple Vitamins-Minerals (THERAPEUTIC MULTIVITAMIN-MINERALS) tablet Take 1 tablet by mouth daily      furosemide (LASIX) 40 MG tablet furosemide 40 mg tablet   TAKE 1 TABLET BY MOUTH EVERY DAY      Potassium Chloride Crys ER (KLOR-CON M10 PO) Take 1 tablet by mouth daily.      amLODIPine (NORVASC) 5 MG tablet Take 1 tablet by mouth daily (Patient not taking: Reported on 04/16/2022) 30 tablet 0    aspirin 81 MG chewable tablet Take 1 tablet by mouth daily 30 tablet 3     No  current facility-administered medications on file prior to visit.       Systems Review:  Review of Systems   Constitutional:  Negative for fatigue, fever and unexpected weight change.   Respiratory:  Negative for chest tightness and shortness of breath.    Cardiovascular:  Negative for chest pain and palpitations.   Skin:  Positive for wound. Negative for color change, pallor and rash.   Hematological:  Negative for adenopathy. Does not bruise/bleed easily.       PMH:  Past Medical History:   Diagnosis Date    Anemia     Arthritis     Chronic pain     Legs and Shoulders    Diabetes (HCC)     Exposure to asbestos     History of blood transfusion     History of colon polyps     HTN (hypertension)     Hx of blood clots     DVT    Hyperlipemia     Menopause     Pulmonary nodule     Serum calcium elevated     Sleep apnea     not using cpap    Stroke [facility])        Surgical History:  Past Surgical History:   Procedure Laterality Date    CESAREAN SECTION  1987    COLONOSCOPY N/A 01/13/2017    COLONOSCOPY performed by Levan Hurst, MD at [facility] ENDOSCOPY     COLONOSCOPY      DILATION AND CURETTAGE OF UTERUS  1995    RECTAL SURGERY N/A 12/28/2021    SACRAL WOUND DEBRIDEMENT INCISION AND DRAINAGE performed by Phineas Semen, DO at [facility] MAIN OR    SPINE SURGERY N/A 12/09/2021    CERVICAL THREE/FOUR/FIVE/SIX LAMINECTOMY FUSION; C-ARM; STRYKER; EXT BONE GROWTH STIM; 23 HR performed by Pamalee Leyden, MD at [facility] MAIN OR       Social History:  Social History     Socioeconomic History    Marital status: Single     Spouse name: None    Number of children: None    Years of education: None    Highest education level: None   Tobacco Use    Smoking status: Former     Packs/day: 1     Types: Cigarettes, Cigars     Quit date: 11/13/2021     Years since quitting: 0.4    Smokeless tobacco: Never   Vaping Use    Vaping Use: Never used   Substance and Sexual Activity    Alcohol use: Not Currently     Comment: occ    Drug use: Never     Social Determinants of Health     Financial Resource Strain: Low Risk  (01/05/2022)    Overall Financial Resource Strain (CARDIA)     Difficulty of Paying Living Expenses: Not hard at all   Food Insecurity: No Food Insecurity (01/05/2022)    Hunger Vital Sign     Worried About Running Out of Food in the Last Year: Never true     Ran Out of Food in the Last Year: Never true   Transportation Needs: No Transportation Needs (02/09/2022)    OASIS A1250: Transportation     Lack of Transportation (Medical): No     Lack of Transportation (Non-Medical): No     Patient Unable or Declines to Respond: No   Physical Activity: Inactive (01/05/2022)    Exercise Vital Sign     Days of Exercise per Week:  0 days     Minutes of Exercise per Session: 0 min   Stress: No Stress Concern Present (01/05/2022)    [employer] of Occupational Health - Occupational Stress Questionnaire     Feeling of Stress : Not at all   Social Connections: Feeling Socially Integrated (02/09/2022)    OASIS D0700: Social Isolation     Frequency of experiencing loneliness or isolation: Never   Recent Concern:  Social Connections - Moderately Isolated (01/05/2022)    Social Connection and Isolation Panel [NHANES]     Frequency of Communication with Friends and Family: Three times a week     Frequency of Social Gatherings with Friends and Family: More than three times a week     Attends Religious Services: More than 4 times per year     Active Member of Clubs or Organizations: No     Attends Banker Meetings: Never     Marital Status: Widowed   Intimate Partner Violence: Not At Risk (01/05/2022)    Humiliation, Afraid, Rape, and Kick questionnaire     Fear of Current or Ex-Partner: No     Emotionally Abused: No     Physically Abused: No     Sexually Abused: No   Housing Stability: Low Risk  (01/05/2022)    Housing Stability Vital Sign     Unable to Pay for Housing in the Last Year: No     Number of Places Lived in the Last Year: 1     Unstable Housing in the Last Year: No       Family History:  Family History   Problem Relation Age of Onset    Diabetes Other 63        parent, NOS    Cancer Father     Hypertension Other 35        parent,NOS    Heart Disease Other 35        parent, NOS    Lung Disease Father        Hospital Outpatient Visit on 03/03/2022   Component Date Value Ref Range Status    [facility] Specimen Collection 03/03/2022 Specimens collected/sent to [facility] Outpatient Visit on 02/03/2022   Component Date Value Ref Range Status    Potassium 02/02/2022 3.8  3.5 - 5.1 mEq/L Final    Chloride 02/02/2022 112 (H)  98 - 107 mEq/L Final    Sodium 02/02/2022 146 (H)  136 - 145 mEq/L Final    CO2 02/02/2022 25  20 - 31 mEq/L Final    Glucose 02/02/2022 206 (H)  74 - 106 mg/dl Final    BUN 34/74/2595 20  9 - 23 mg/dl Final    Creatinine 63/87/5643 0.87  0.55 - 1.02 mg/dl Final    GFR African American 02/02/2022 >60.0    Final    Comment: THE [facility] [employer] STATES THAT THE MDRD STUDY EQUATION SHOULD ONLY BE USED ON  INDIVIDUALS 18 OR OLDER. THE REPORT ALSO NOTES THAT THE MDRD STUDY  EQUATION HAS NOT BEEN  VALIDATED FOR USE WITH THE ELDERLY (OVER 6 YEARS OF AGE), PREGNANT WOMEN, PATIENTS WITH SERIOUS  COMORBID CONDITIONS, OR PERSONS WITH EXTREMES OF BODY SIZE, MUSCLE MASS, OR NUTRITIONAL STATUS.  APPLICATION OF THE EQUATION TO THESE PATIENT GROUPS MAY LEAD TO ERRORS IN GFR ESTIMATION. GFR  ESTIMATING EQUATIONS HAVE POORER AGREEMENT WITH MEASURED GFR FOR ILL HOSPITALIZED PATIENTS AND  FOR PEOPLE WITH NEAR NORMAL KIDNEY FUNCTION THAN  FOR SUBJECTS IN THE MDRD STUDY. VALIDATION  STUDIES ARE IN PROGRESS TO EVALUATE THE MDRD STUDY EQUATION FOR ADDITIONAL ETHNIC GROUPS, THE  ELDERLY, VARIOUS DISEASE CONDITIONS, AND PEOPLE WITH NORMAL KIDNEY FUNCTION.    GFRA----REFERS TO AFRICAN AMERICAN  GFRO---REFERS TO OTHER RACES    REFERENCES AVAILABLE UPON REQUEST.        GFR Non-African American 02/02/2022 >60    Final    Calcium 02/02/2022 10.5 (H)  8.7 - 10.4 mg/dl Final    Anion Gap 78/93/8101 9  5 - 15 mmol/L Final    AST 02/02/2022 34.0 (H)  0.0 - 33.9 U/L Final    ALT 02/02/2022 37  10 - 49 U/L Final    Alkaline Phosphatase 02/02/2022 139 (H)  46 - 116 U/L Final    Total Bilirubin 02/02/2022 0.20 (L)  0.30 - 1.20 mg/dl Final    Total Protein 02/02/2022 6.0  5.7 - 8.2 gm/dl Final    Albumin 75/05/2584 2.9 (L)  3.4 - 5.0 gm/dl Final   No results displayed because visit has over 200 results.          XR CERVICAL SPINE (4-5 VIEWS)  Narrative: Cervical spine flexion and extension views    Indications:    Status post cervical spine fusion    Comparison:    None    Findings:    AP, lateral and swimmer's view of the cervical spine are submitted with lateral  flexion and extension views. The patient is status post posterior fusion C3-C6.  Status post C3 root C6 laminectomies. Surgical hardware is stable in appearance.  Disc space narrowing and discal spurring is seen at the C6-C7 level. I see no  fracture or subluxation. I see no instability on flexion and extension views.  Prevertebral soft tissues and predental  space are normal.  Impression: Stable appearing posterior fusion C3-C6. 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 No acute abnormalities. No instability.         Physical Exam:  BP 118/68   Pulse 75   Temp 98.2 F (36.8 C)   Resp 20   SpO2 99%  BMI: There is no height or weight on file to calculate BMI.    Physical Exam  Constitutional:       Appearance: Normal appearance.   HENT:      Head: Normocephalic.   Eyes:      Extraocular Movements: Extraocular movements intact.      Conjunctiva/sclera: Conjunctivae normal.      Pupils: Pupils are equal, round, and reactive to light.   Cardiovascular:      Rate and Rhythm: Normal rate.   Skin:     General: Skin is warm and dry.      Comments: Sacral wound stage 4, beefy red granulation tissue bed of wound, no periwound erythema or break down   Neurological:      General: No focal deficit present.      Mental Status: She is alert and oriented to person, place, and time. Mental status is at baseline.   Psychiatric:         Mood and Affect: Mood normal.         Behavior: Behavior normal.         Thought Content: Thought content normal.         Judgment: Judgment normal.         I have reviewed the information entered by the clinical staff and/or patient and verified it as accurate or edited  where necessary.     Electronically signed by:    Nestor Ramp, DO, MPH

## 2022-04-18 ENCOUNTER — Encounter: Admit: 2022-04-18 | Discharge: 2022-04-18 | Payer: PRIVATE HEALTH INSURANCE | Primary: Physician Assistant

## 2022-04-18 NOTE — Home Health (Signed)
Skilled reason for visit: Wound care    Caregiver involvement: Pt resides in single story home with her daughter, amb with use of a walker, daughter is pts CG and assts with ADL's, medication, appts and transportation and is available 24/7 to asst as needed.    This visit: Pt is A&O x4, with some periods of forgetfulness, pt is able to communicate her needs, daughter assts with all her ADLs, sacral wound remains stable.    Home health supplies by type and quantity ordered/delivered this visit include:NA    Medications reviewed and all medications are available in the home this visit.    The following education was provided regarding medications: SN Instructed pt and CG not to stop, start or change the way she is currently taking any medications without consulting with the MD    MD notified of any discrepancies/look a-like medications/medication interactions: NA    Medications are effective at this time.     Patient education provided this visit: SEE INTERVENTION LIST    -SN instructed pt/CG to notify home health or physician for the following wound healing complications: increased drainage, pus or a foul odor coming from the wound, fever, muscle, joint, or body aches, sweating, increased swelling, redness or red streaks surrounding the wound; dramatic change in color or size of wound; stitches coming apart or wound reopening; increase in pain at wound site in spite of interventions.    Pt and CG acknowledged understanding of all education provided.     Patient's Progress towards personal goals: when patient reaches goals and medication is managed, disease processes are understood patient agrees and understand that discharge will take place.

## 2022-04-19 ENCOUNTER — Encounter: Payer: Self-pay | Admitting: Podiatry

## 2022-04-19 ENCOUNTER — Ambulatory Visit (INDEPENDENT_AMBULATORY_CARE_PROVIDER_SITE_OTHER): Payer: Medicare Other | Admitting: Podiatry

## 2022-04-19 DIAGNOSIS — B351 Tinea unguium: Secondary | ICD-10-CM | POA: Diagnosis not present

## 2022-04-19 DIAGNOSIS — E1142 Type 2 diabetes mellitus with diabetic polyneuropathy: Secondary | ICD-10-CM | POA: Diagnosis not present

## 2022-04-19 DIAGNOSIS — M79675 Pain in left toe(s): Secondary | ICD-10-CM

## 2022-04-19 DIAGNOSIS — B353 Tinea pedis: Secondary | ICD-10-CM | POA: Diagnosis not present

## 2022-04-19 DIAGNOSIS — M79674 Pain in right toe(s): Secondary | ICD-10-CM | POA: Diagnosis not present

## 2022-04-21 ENCOUNTER — Encounter: Admit: 2022-04-21 | Discharge: 2022-04-21 | Payer: PRIVATE HEALTH INSURANCE | Primary: Physician Assistant

## 2022-04-21 NOTE — Home Health (Signed)
Skilled reason for visit: wound Care     Caregiver involvement: Daugter assist with meals, medication, bathing  and transportation     Medications reviewed and all medications are not available in the home this visit.    The following education was provided regarding medications:  Aspirin SN instructed you may bleed more easily. Be careful and avoid injury. Use a soft toothbrush and an Copy.  MD notified of any discrepancies/look a-like medications/medication interactions: n/a  Medications are effective  at this time.      Home health supplies by type and quantity ordered/delivered this visit include: n/a    Patient education provided this visit: Instruct patient/CG on the importance of adeguate nutrition and hydration for wound healing.  Healthy foods give your body the nutrients it needs to heal wounds.  Protein foods like meats,fish,nuts and soy products are important wound healing.  In addition to protein, calories,vitamin C and zinc help wound heal.  Liquid prevent dehydration that can decrease the blood supply to wounds.  Always follow physician recommended diet in regards to patient conditions.  Instruct on other factors that affect wound healing such as:  maintaining genteral hygiene, not smoking or using tobacco products, offloading pressure relieving devices, and management of blood sugar.    Sharps education provided:Clinician instructed patient/CG on proper disposal of sharps: Containers should be made of hard plastic, be puncture-resistant and leakproof, such as a laundry detergent or bleach bottle. When the container is  full, it should be sealed with tape and labeled DO NOT RECYCLE prior to discarding in the regular trash.     Patient level of understanding of education provided: Patient responded well to education.  Patient asked several questions and able to repeat back portion of the education.     Patient response to procedure performed:Patient tolerated wound care, denies any pain or  discomfort r/t procedure.    Agency Progress toward goals: Continue to promote wound healing     Patient's Progress towards personal goals: Patient personal goals to continue healing absent of infection     Home exercise program: monitor for s/s of infection     Continued need for the following skills: Nursing.    Plan for next visit: wound care     Patient and/or caregiver notified and agrees to changes in the Plan of Care: Yes.     The following discharge planning was discussed with the pt/caregiver: Discharge when wound is healed or managed by caregivers.

## 2022-04-23 ENCOUNTER — Encounter: Admit: 2022-04-23 | Discharge: 2022-04-23 | Payer: PRIVATE HEALTH INSURANCE | Primary: Physician Assistant

## 2022-04-23 NOTE — Home Health (Signed)
 Skilled reason for visit: Wound care    Caregiver involvement: Pt resides in single story home with her daughter, amb with use of a walker, daughter is pts CG and assts with ADL's, medication, appts and transportation and is available 24/7 to asst as needed.    This visit: Pt is A&O x4, able to communicate her needs, denies pain or discomfort this visit, daughter assts with all her ADLs, sacral wound remains stable continues to decrease in size and drainage.    Home health supplies by type and quantity ordered/delivered this visit include:NA    Medications reviewed and all medications are available in the home this visit.    The following education was provided regarding medications: SN Instructed pt and CG not to stop, start or change the way she is currently taking any medications without consulting with the MD    MD notified of any discrepancies/look a-like medications/medication interactions: NA    Medications are effective at this time.     Patient education provided this visit: SEE INTERVENTION LIST    -SN instructed pt/CG to notify home health or physician for the following wound healing complications: increased drainage, pus or a foul odor coming from the wound, fever, muscle, joint, or body aches, sweating, increased swelling, redness or red streaks surrounding the wound; dramatic change in color or size of wound; stitches coming apart or wound reopening; increase in pain at wound site in spite of interventions.    Pt and CG acknowledged understanding of all education provided.     Patient's Progress towards personal goals: when patient reaches goals and medication is managed, disease processes are understood patient agrees and understand that discharge will take place.

## 2022-04-25 ENCOUNTER — Encounter: Payer: PRIVATE HEALTH INSURANCE | Primary: Physician Assistant

## 2022-04-25 NOTE — Home Health (Signed)
Skilled reason for visit: Wound care    Caregiver involvement: Pt resides in single story home with her daughter, amb with use of a walker, daughter is pts CG and assts with ADL's, medication, appts and transportation and is available 24/7 to asst as needed.    This visit: Pt is A&O x4, able to communicate her needs, denies pain or discomfort this visit, daughter assts with all her ADLs, sacral wound remains stable.     Home health supplies by type and quantity ordered/delivered this visit include:NA    Medications reviewed and all medications are available in the home this visit.    The following education was provided regarding medications: SN Instructed pt and CG not to stop, start or change the way she is currently taking any medications without consulting with the MD    MD notified of any discrepancies/look a-like medications/medication interactions: NA    Medications are effective at this time.     Patient education provided this visit: SEE INTERVENTION LIST    -SN instructed pt/CG to notify home health or physician for the following wound healing complications: increased drainage, pus or a foul odor coming from the wound, fever, muscle, joint, or body aches, sweating, increased swelling, redness or red streaks surrounding the wound; dramatic change in color or size of wound; stitches coming apart or wound reopening; increase in pain at wound site in spite of interventions.    Pt and CG acknowledged understanding of all education provided.     Patient's Progress towards personal goals: when patient reaches goals and medication is managed, disease processes are understood patient agrees and understand that discharge will take place.

## 2022-04-26 NOTE — Progress Notes (Signed)
  Subjective:  Patient ID: Julie Elliott, female    DOB: 1956-02-07,  MRN: [id number]  Julie Elliott presents to clinic today for at risk foot care with history of diabetic neuropathy and painful elongated mycotic toenails 1-5 bilaterally which are tender when wearing enclosed shoe gear. Pain is relieved with periodic professional debridement.  She did have biopsy of right 5th digit performed by Dr. Sherryle Lis and has received results. They will monitor digit for any changes for now.  Patient is accompanied by her son, Julie Elliott, on today's visit.  New problem(s): None.   PCP is Rick Duff, MD , and last visit was  August 20, 2021.  Allergies  Allergen Reactions   Loratadine-Pseudoephedrine Er Hives    Claritin-D    Review of Systems: Negative except as noted in the HPI.  Objective: No changes noted in today's physical examination. Julie Elliott is a pleasant 66 y.o. female in NAD. AAO x 3. General: Patient is a pleasant [name] in NAD. AAO x 3.   Neurovascular Examination: CFT <3 seconds b/l LE. Palpable DP pulse(s) b/l LE. Palpable PT pulse(s) b/l LE. Pedal hair absent. No pain with calf compression b/l. Lower extremity skin temperature gradient within normal limits. Nonpitting edema noted BLE. No cyanosis or clubbing noted b/l LE.  Pt has subjective symptoms of neuropathy. Protective sensation intact 5/5 intact bilaterally with 10g monofilament b/l. Vibratory sensation intact b/l.  Dermatological:  Pedal skin is warm and supple b/l LE. oenails 1-5 bilaterally elongated, discolored, dystrophic, thickened, and crumbly with subungual debris and tenderness to dorsal palpation. Multiple nevi noted b/l LE, medial aspect right foot and medial aspect right 5th digit.   No hyperkeratotic lesions noted today.  Biopsy site right 5th digit healed.  Interdigital maceration webspaces 3rd and 4th web spaces bilaterally. No breaks in skin, no blistering, no signs of secondary  bacterial infection.  Musculoskeletal:  Muscle strength 5/5 to all lower extremity muscle groups bilaterally. No pain, crepitus or joint limitation noted with ROM bilateral LE. No gross bony deformities bilaterally.  Assessment/Plan: 1. Pain due to onychomycosis of toenails of both feet   2. Tinea pedis of both feet   3. Diabetic peripheral neuropathy associated with type 2 diabetes mellitus ([place])     -Patient's family member present. All questions/concerns addressed on today's visit. -Continue foot and shoe inspections daily. Monitor blood glucose per PCP/Endocrinologist's recommendations. -Toenails 1-5 b/l were debrided in length and girth with sterile nail nippers and dremel without iatrogenic bleeding.  -Patient instructed to purchase OTC Lotrimin antifungal spray powder between toes once daily. -Patient/POA to call should there be question/concern in the interim.   Return in about 3 months (around 07/19/2022).  Marzetta Board, DPM

## 2022-04-28 ENCOUNTER — Encounter: Payer: PRIVATE HEALTH INSURANCE | Primary: Physician Assistant

## 2022-04-28 NOTE — Home Health (Signed)
 Skilled reason for visit: Wound care    Caregiver involvement: Pt resides in single story home with her daughter, amb with use of a walker, daughter is pts CG and assts with ADL's, medication, appts and transportation and is available 24/7 to asst as needed.    This visit: Pt is A&O x4, able to communicate her needs, denies pain or discomfort this visit, daughter assts with all her ADLs, sacral wound continues to heal well and continues to decrease in size and drainage.     Home health supplies by type and quantity ordered/delivered this visit include:NA    Medications reviewed and all medications are available in the home this visit.    The following education was provided regarding medications: SN Instructed pt and CG not to stop, start or change the way she is currently taking any medications without consulting with the MD    MD notified of any discrepancies/look a-like medications/medication interactions: NA    Medications are effective at this time.     Patient education provided this visit: SEE INTERVENTION LIST    -SN instructed pt/CG to notify home health or physician for the following wound healing complications: increased drainage, pus or a foul odor coming from the wound, fever, muscle, joint, or body aches, sweating, increased swelling, redness or red streaks surrounding the wound; dramatic change in color or size of wound; stitches coming apart or wound reopening; increase in pain at wound site in spite of interventions.    Pt and CG acknowledged understanding of all education provided.     Patient's Progress towards personal goals: when patient reaches goals and medication is managed, disease processes are understood patient agrees and understand that discharge will take place.

## 2022-04-30 ENCOUNTER — Encounter: Payer: PRIVATE HEALTH INSURANCE | Primary: Physician Assistant

## 2022-04-30 NOTE — Home Health (Signed)
 Skilled reason for visit: Wound care    Caregiver involvement: Pt resides in single story home with her daughter, amb with use of a walker, daughter is pts CG and assts with ADL's, medication, appts and transportation and is available 24/7 to asst as needed.    This visit: Pt is A&O x4, able to communicate her needs, denies pain or discomfort this visit, daughter assts with all her ADLs, pts daughter requested SN to remove wound vac for pts early morning appt, provider wanted to see pts wound, SN applied wet to dry dsg as per order, will reapply dsg at pts next visit on friday. Pt was also started on oral ABT (Cipro 500mg  po bid for 7 days) for UTI, daughter did a clean catch urine specimen and took it to the PCP office for processing which indicated pt has a UTI pt started on 04/29/22.    Home health supplies by type and quantity ordered/delivered this visit include:NA    Medications reviewed and all medications are available in the home this visit.    The following education was provided regarding medications: SN Instructed pt and CG not to stop, start or change the way she is currently taking any medications without consulting with the MD    MD notified of any discrepancies/look a-like medications/medication interactions: NA    Medications are effective at this time.     Patient education provided this visit: SEE INTERVENTION LIST    -SN instructed pt/CG to notify home health or physician for the following wound healing complications: increased drainage, pus or a foul odor coming from the wound, fever, muscle, joint, or body aches, sweating, increased swelling, redness or red streaks surrounding the wound; dramatic change in color or size of wound; stitches coming apart or wound reopening; increase in pain at wound site in spite of interventions.    Pt and CG acknowledged understanding of all education provided.     Patient's Progress towards personal goals: when patient reaches goals and medication is managed,  disease processes are understood patient agrees and understand that discharge will take place.

## 2022-05-02 ENCOUNTER — Encounter: Payer: PRIVATE HEALTH INSURANCE | Primary: Physician Assistant

## 2022-05-02 NOTE — Home Health (Signed)
 Skilled reason for visit: Wound care    Caregiver involvement: Pt resides in single story home with her daughter, amb with use of a walker, daughter is pts CG and assts with ADL's, medication, appts and transportation and is available 24/7 to asst as needed.    This visit: Pt is A&O x4, able to communicate her needs, denies pain or discomfort this visit, wound care provided as ordered, pt tolerated well, wound remains stable, continues to heal well, ABT in progress continues (UTI) no A/R noted or reported.    Home health supplies by type and quantity ordered/delivered this visit include:NA    Medications reviewed and all medications are available in the home this visit.    The following education was provided regarding medications: SN Instructed pt and CG not to stop, start or change the way she is currently taking any medications without consulting with the MD    MD notified of any discrepancies/look a-like medications/medication interactions: NA    Medications are effective at this time.     Patient education provided this visit: SEE INTERVENTION LIST    -SN instructed pt/CG to notify home health or physician for the following wound healing complications: increased drainage, pus or a foul odor coming from the wound, fever, muscle, joint, or body aches, sweating, increased swelling, redness or red streaks surrounding the wound; dramatic change in color or size of wound; stitches coming apart or wound reopening; increase in pain at wound site in spite of interventions.    Pt and CG acknowledged understanding of all education provided.     Patient's Progress towards personal goals: when patient reaches goals and medication is managed, disease processes are understood patient agrees and understand that discharge will take place.

## 2022-05-05 ENCOUNTER — Encounter: Admit: 2022-05-05 | Discharge: 2022-05-05 | Payer: PRIVATE HEALTH INSURANCE | Primary: Physician Assistant

## 2022-05-05 NOTE — Home Health (Signed)
 Skilled reason for visit: Wound care    Caregiver involvement: Pt resides in single story home with her daughter, amb with use of a walker, daughter is pts CG and assts with ADL's, medication, appts and transportation and is available 24/7 to asst as needed.    This visit: Pt is A&O x4, able to communicate her needs, denies pain or discomfort this visit, wound care provided as ordered, pt tolerated well, wound remains stable.     Home health supplies by type and quantity ordered/delivered this visit include:NA    Medications reviewed and all medications are available in the home this visit.    The following education was provided regarding medications: SN Instructed pt and CG not to stop, start or change the way she is currently taking any medications without consulting with the MD     MD notified of any discrepancies/look a-like medications/medication interactions: NA    Medications are effective at this time.     Patient education provided this visit: SEE INTERVENTION LIST    -SN instructed pt/CG to notify home health or physician for the following wound healing complications: increased drainage, pus or a foul odor coming from the wound, fever, muscle, joint, or body aches, sweating, increased swelling, redness or red streaks surrounding the wound; dramatic change in color or size of wound; stitches coming apart or wound reopening; increase in pain at wound site in spite of interventions.    Pt and CG acknowledged understanding of all education provided.     Patient's Progress towards personal goals: when patient reaches goals and medication is managed, disease processes are understood patient agrees and understand that discharge will take place.

## 2022-05-07 ENCOUNTER — Encounter: Payer: PRIVATE HEALTH INSURANCE | Primary: Physician Assistant

## 2022-05-07 NOTE — Home Health (Signed)
 Skilled reason for visit: Wound care    Caregiver involvement: Pt resides in single story home with her daughter, amb with use of a walker, daughter is pts CG and assts with ADL's, medication, appts and transportation and is available 24/7 to asst as needed.    This visit: Pt is A&O x4, able to communicate her needs, denies pain or discomfort this visit, wound care provided as ordered, pt tolerated well, wound remains stable.     Home health supplies by type and quantity ordered/delivered this visit include:NA    Medications reviewed and all medications are available in the home this visit.    The following education was provided regarding medications: SN Instructed pt and CG not to stop, start or change the way she is currently taking any medications without consulting with the MD     MD notified of any discrepancies/look a-like medications/medication interactions: NA    Medications are effective at this time.     Patient education provided this visit: SEE INTERVENTION LIST    -SN instructed pt/CG to notify home health or physician for the following wound healing complications: increased drainage, pus or a foul odor coming from the wound, fever, muscle, joint, or body aches, sweating, increased swelling, redness or red streaks surrounding the wound; dramatic change in color or size of wound; stitches coming apart or wound reopening; increase in pain at wound site in spite of interventions.    Pt and CG acknowledged understanding of all education provided.     Patient's Progress towards personal goals: when patient reaches goals and medication is managed, disease processes are understood patient agrees and understand that discharge will take place.

## 2022-05-08 NOTE — Unmapped (Addendum)
 Code Status: Full Code

## 2022-05-10 ENCOUNTER — Encounter: Admit: 2022-05-10 | Discharge: 2022-05-10 | Payer: PRIVATE HEALTH INSURANCE | Primary: Physician Assistant

## 2022-05-10 NOTE — Home Health (Signed)
Skilled reason for visit: wound vac    Caregiver involvement: daughter assist with ADLs, meal prep, meds and transportation    Medications reviewed and all medications are available in the home this visit.    The following education was provided regarding medications:   SN instructed on proper method of medication intake, as many people taking prescription medications do not follow their doctors orders. SN instruct on medication compliance to better control the patients disease process, to refill medication on time to prevent missed/skipped doses. do not take any medication that does not belong to you. Also ask your doctor before taking any over the counter medication to avoid interactions. Patient verbalized understanding.    .    MD notified of any discrepancies/look a-like medications/medication interactions: none  Medications are effective at this time.      Home health supplies by type and quantity ordered/delivered this visit include: n/a    Patient education provided this visit: see intervention    Sharps education provided: Clinician instructed patient/CG on proper disposal of sharps: Containers should be made of hard plastic, be puncture-resistant and leakproof, such as a laundry detergent or bleach bottle. When the container is  full, it should be sealed with tape and labeled DO NOT RECYCLE prior to discarding in the regular trash.       Patient level of understanding of education provided: Patient and caregiver verbalized understanding      Patient response to procedure performed:  Patient tolerated w/o any increased level of pain      Agency Progress toward goals: progressing    Patient's Progress towards personal goals: progressing    Home exercise program: Take all medications as prescribed, follow all fall precaution measures, attend all future medical appointments      Continued need for the following skills: Nursing.    Plan for next visit: wound care    Patient and/or caregiver notified and  agrees to changes in the Plan of Care: Yes.     The following discharge planning was discussed with the pt/caregiver: Discharge when goals are met, patient/caregiver able to manage disease process, medications, and pain

## 2022-05-12 ENCOUNTER — Encounter: Admit: 2022-05-12 | Discharge: 2022-05-12 | Payer: PRIVATE HEALTH INSURANCE | Primary: Physician Assistant

## 2022-05-12 NOTE — Home Health (Signed)
Skilled reason for visit: Wound care    Caregiver involvement: Pt resides in single story home with her daughter, amb with use of a walker, daughter is pts CG and assts with ADL's, medication, appts and transportation and is available 24/7 to asst as needed.    This visit: Pt is A&O x4, able to communicate her needs, denies pain or discomfort this visit, wound care provided as ordered, pt tolerated well, wound continues to heal well, pts daughter states she has a f/u appt around mid December, SN mentioned to daughter that we may have to contact the MD for an order to d/c the vac d/t the wound is closing.    Home health supplies by type and quantity ordered/delivered this visit include:NA    Medications reviewed and all medications are available in the home this visit.    The following education was provided regarding medications: SN Instructed pt and CG not to stop, start or change the way she is currently taking any medications without consulting with the MD    MD notified of any discrepancies/look a-like medications/medication interactions: NA    Medications are effective at this time.     Patient education provided this visit: SEE INTERVENTION LIST    -SN instructed pt/CG to notify home health or physician for the following wound healing complications: increased drainage, pus or a foul odor coming from the wound, fever, muscle, joint, or body aches, sweating, increased swelling, redness or red streaks surrounding the wound; dramatic change in color or size of wound; stitches coming apart or wound reopening; increase in pain at wound site in spite of interventions.    Pt and CG acknowledged understanding of all education provided.     Patient's Progress towards personal goals: when patient reaches goals and medication is managed, disease processes are understood patient agrees and understand that discharge will take place

## 2022-05-14 ENCOUNTER — Encounter: Admit: 2022-05-14 | Discharge: 2022-05-14 | Payer: PRIVATE HEALTH INSURANCE | Primary: Physician Assistant

## 2022-05-14 NOTE — Home Health (Signed)
Skilled reason for visit: Wound care    Caregiver involvement: Pt resides in single story home with her daughter, amb with use of a walker, daughter is pts CG and assts with ADL's, medication, appts and transportation and is available 24/7 to asst as needed.    This visit: Pt is A&O x4, able to communicate her needs, denies pain or discomfort this visit, wound care provided as ordered, pt tolerated well, wound continues to heal well.      Home health supplies by type and quantity ordered/delivered this visit include:NA    Medications reviewed and all medications are available in the home this visit.    The following education was provided regarding medications: SN Instructed pt and CG not to stop, start or change the way she is currently taking any medications without consulting with the MD    MD notified of any discrepancies/look a-like medications/medication interactions: NA    Medications are effective at this time.     Patient education provided this visit: SEE INTERVENTION LIST    -SN instructed pt/CG to notify home health or physician for the following wound healing complications: increased drainage, pus or a foul odor coming from the wound, fever, muscle, joint, or body aches, sweating, increased swelling, redness or red streaks surrounding the wound; dramatic change in color or size of wound; stitches coming apart or wound reopening; increase in pain at wound site in spite of interventions.    Pt and CG acknowledged understanding of all education provided.     Patient's Progress towards personal goals: when patient reaches goals and medication is managed, disease processes are understood patient agrees and understand that discharge will take place

## 2022-05-16 ENCOUNTER — Encounter: Admit: 2022-05-16 | Discharge: 2022-05-16 | Payer: PRIVATE HEALTH INSURANCE | Primary: Physician Assistant

## 2022-05-16 NOTE — Home Health (Signed)
Skilled reason for visit: Wound care    Caregiver involvement: Pt resides in single story home with her daughter, amb with use of a walker, daughter is pts CG and assts with ADL's, medication, appts and transportation and is available 24/7 to asst as needed.    This visit: Pt is A&O x4, able to communicate her needs, denies pain or discomfort this visit, wound care provided as ordered, pt tolerated well, wound continues to heal well. SN visits are longer when pts CG/daughter is not present to open the door and asst pt with amb, pt has to amb slowly and maneuver around the cluttered walk ways to let SN into the home.    Home health supplies by type and quantity ordered/delivered this visit include:NA    Medications reviewed and all medications are available in the home this visit.    The following education was provided regarding medications: SN Instructed pt and CG not to stop, start or change the way she is currently taking any medications without consulting with the MD    MD notified of any discrepancies/look a-like medications/medication interactions: NA    Medications are effective at this time.     Patient education provided this visit: SEE INTERVENTION LIST    -SN instructed pt/CG to notify home health or physician for the following wound healing complications: increased drainage, pus or a foul odor coming from the wound, fever, muscle, joint, or body aches, sweating, increased swelling, redness or red streaks surrounding the wound; dramatic change in color or size of wound; stitches coming apart or wound reopening; increase in pain at wound site in spite of interventions.    Pt and CG acknowledged understanding of all education provided.     Patient's Progress towards personal goals: when patient reaches goals and medication is managed, disease processes are understood patient agrees and understand that discharge will take place

## 2022-05-19 ENCOUNTER — Encounter: Admit: 2022-05-19 | Discharge: 2022-05-19 | Payer: PRIVATE HEALTH INSURANCE | Primary: Physician Assistant

## 2022-05-19 NOTE — Home Health (Signed)
Skilled reason for visit: Wound care    Caregiver involvement: Pt resides in single story home with her daughter, amb with use of a walker, daughter is pts CG and assts with ADL's, medication, appts and transportation and is available 24/7 to asst as needed.    This visit: Pt is A&O x4, able to communicate her needs, denies pain or discomfort this visit, wound care provided as ordered, pt tolerated well, wound continues to heal well.     Home health supplies by type and quantity ordered/delivered this visit include:NA    Medications reviewed and all medications are available in the home this visit.    The following education was provided regarding medications: SN Instructed pt and CG not to stop, start or change the way she is currently taking any medications without consulting with the MD    MD notified of any discrepancies/look a-like medications/medication interactions: NA    Medications are effective at this time.     Patient education provided this visit: SEE INTERVENTION LIST    -SN instructed pt/CG to notify home health or physician for the following wound healing complications: increased drainage, pus or a foul odor coming from the wound, fever, muscle, joint, or body aches, sweating, increased swelling, redness or red streaks surrounding the wound; dramatic change in color or size of wound; stitches coming apart or wound reopening; increase in pain at wound site in spite of interventions.    Pt and CG acknowledged understanding of all education provided.     Patient's Progress towards personal goals: when patient reaches goals and medication is managed, disease processes are understood patient agrees and understand that discharge will take place

## 2022-05-21 ENCOUNTER — Encounter: Admit: 2022-05-21 | Discharge: 2022-05-21 | Payer: PRIVATE HEALTH INSURANCE | Primary: Physician Assistant

## 2022-05-21 NOTE — Home Health (Signed)
Skilled reason for visit: Wound care    Caregiver involvement: Pt resides in single story home with her daughter, amb with use of a walker, daughter is pts CG and assts with ADL's, medication, appts and transportation and is available 24/7 to asst as needed.    This visit: Pt is A&O x4, able to communicate her needs, denies pain or discomfort this visit, wound care provided as ordered, pt tolerated well, wound continues to heal well.     Home health supplies by type and quantity ordered/delivered this visit include:NA    Medications reviewed and all medications are available in the home this visit.    The following education was provided regarding medications: SN Instructed pt and CG not to stop, start or change the way she is currently taking any medications without consulting with the MD    MD notified of any discrepancies/look a-like medications/medication interactions: NA    Medications are effective at this time.     Patient education provided this visit: SEE INTERVENTION LIST    -SN instructed pt/CG to notify home health or physician for the following wound healing complications: increased drainage, pus or a foul odor coming from the wound, fever, muscle, joint, or body aches, sweating, increased swelling, redness or red streaks surrounding the wound; dramatic change in color or size of wound; stitches coming apart or wound reopening; increase in pain at wound site in spite of interventions.    Pt and CG acknowledged understanding of all education provided.     Patient's Progress towards personal goals: when patient reaches goals and medication is managed, disease processes are understood patient agrees and understand that discharge will take place

## 2022-05-23 ENCOUNTER — Encounter: Admit: 2022-05-23 | Discharge: 2022-05-23 | Payer: PRIVATE HEALTH INSURANCE | Primary: Physician Assistant

## 2022-05-23 NOTE — Home Health (Signed)
Skilled reason for visit: Wound care    Caregiver involvement: Pt resides in single story home with her daughter, amb with use of a walker, daughter is pts CG and assts with ADL's, medication, appts and transportation and is available 24/7 to asst as needed.    This visit: Pt is A&O x4, able to communicate her needs, denies pain or discomfort this visit, wound care provided as ordered, pt tolerated well, wound continues to heal well.     Home health supplies by type and quantity ordered/delivered this visit include:NA    Medications reviewed and all medications are available in the home this visit.    The following education was provided regarding medications: SN Instructed pt and CG not to stop, start or change the way she is currently taking any medications without consulting with the MD    MD notified of any discrepancies/look a-like medications/medication interactions: NA    Medications are effective at this time.     Patient education provided this visit: SEE INTERVENTION LIST    -SN instructed pt/CG to notify home health or physician for the following wound healing complications: increased drainage, pus or a foul odor coming from the wound, fever, muscle, joint, or body aches, sweating, increased swelling, redness or red streaks surrounding the wound; dramatic change in color or size of wound; stitches coming apart or wound reopening; increase in pain at wound site in spite of interventions.    Pt and CG acknowledged understanding of all education provided.     Patient's Progress towards personal goals: when patient reaches goals and medication is managed, disease processes are understood patient agrees and understand that discharge will take place

## 2022-05-26 ENCOUNTER — Encounter: Admit: 2022-05-26 | Discharge: 2022-05-26 | Payer: PRIVATE HEALTH INSURANCE | Primary: Physician Assistant

## 2022-05-26 NOTE — Home Health (Signed)
Skilled reason for visit: Wound care    Caregiver involvement: Pt resides in single story home with her daughter, amb with use of a walker, daughter is pts CG and assts with ADL's, medication, appts and transportation and is available 24/7 to asst as needed.    This visit: Pt is A&O x4, able to communicate her needs, denies pain or discomfort this visit, wound care provided as ordered, pt tolerated well, wound continues to heal well. Pt had an appt with Neurology today, no new orders received per pt and CG and no change in meds.     Home health supplies by type and quantity ordered/delivered this visit include:NA    Medications reviewed and all medications are available in the home this visit.    The following education was provided regarding medications: SN Instructed pt and CG not to stop, start or change the way she is currently taking any medications without consulting with the MD    MD notified of any discrepancies/look a-like medications/medication interactions: NA    Medications are effective at this time.     Patient education provided this visit: SEE INTERVENTION LIST    -SN instructed pt/CG to notify home health or physician for the following wound healing complications: increased drainage, pus or a foul odor coming from the wound, fever, muscle, joint, or body aches, sweating, increased swelling, redness or red streaks surrounding the wound; dramatic change in color or size of wound; stitches coming apart or wound reopening; increase in pain at wound site in spite of interventions.    Pt and CG acknowledged understanding of all education provided.     Patient's Progress towards personal goals: when patient reaches goals and medication is managed, disease processes are understood patient agrees and understand that discharge will take place

## 2022-05-28 ENCOUNTER — Encounter: Admit: 2022-05-28 | Discharge: 2022-05-28 | Payer: PRIVATE HEALTH INSURANCE | Primary: Physician Assistant

## 2022-05-28 NOTE — Home Health (Signed)
Skilled reason for visit: Wound care    Caregiver involvement: Pt resides in single story home with her daughter, amb with use of a walker, daughter is pts CG and assts with ADL's, medication, appts and transportation and is available 24/7 to asst as needed.    This visit: Pt is A&O x4, able to communicate her needs, denies pain or discomfort this visit, wound care provided as ordered, pt tolerated well, wound continues to heal well.     Home health supplies by type and quantity ordered/delivered this visit include:NA    Medications reviewed and all medications are available in the home this visit.    The following education was provided regarding medications: SN Instructed pt and CG not to stop, start or change the way she is currently taking any medications without consulting with the MD    MD notified of any discrepancies/look a-like medications/medication interactions: NA    Medications are effective at this time.     Patient education provided this visit: SEE INTERVENTION LIST    -SN instructed pt/CG to notify home health or physician for the following wound healing complications: increased drainage, pus or a foul odor coming from the wound, fever, muscle, joint, or body aches, sweating, increased swelling, redness or red streaks surrounding the wound; dramatic change in color or size of wound; stitches coming apart or wound reopening; increase in pain at wound site in spite of interventions.    Pt and CG acknowledged understanding of all education provided.     Patient's Progress towards personal goals: when patient reaches goals and medication is managed, disease processes are understood patient agrees and understand that discharge will take place

## 2022-05-30 ENCOUNTER — Encounter: Admit: 2022-05-30 | Discharge: 2022-05-30 | Payer: PRIVATE HEALTH INSURANCE | Primary: Physician Assistant

## 2022-05-30 NOTE — Home Health (Signed)
Skilled reason for visit: Wound care    Caregiver involvement: Pt resides in single story home with her daughter, amb with use of a walker, daughter is pts CG and assts with ADL's, medication, appts and transportation and is available 24/7 to asst as needed.    This visit: Pt is A&O x4, able to communicate her needs, denies pain or discomfort this visit, wound care provided as ordered, pt tolerated well, wound continues to heal well.     Home health supplies by type and quantity ordered/delivered this visit include:NA    Medications reviewed and all medications are available in the home this visit.    The following education was provided regarding medications: SN Instructed pt and CG not to stop, start or change the way she is currently taking any medications without consulting with the MD    MD notified of any discrepancies/look a-like medications/medication interactions: NA    Medications are effective at this time.     Patient education provided this visit: SEE INTERVENTION LIST    -SN instructed pt/CG to notify home health or physician for the following wound healing complications: increased drainage, pus or a foul odor coming from the wound, fever, muscle, joint, or body aches, sweating, increased swelling, redness or red streaks surrounding the wound; dramatic change in color or size of wound; stitches coming apart or wound reopening; increase in pain at wound site in spite of interventions.    Pt and CG acknowledged understanding of all education provided.     Patient's Progress towards personal goals: when patient reaches goals and medication is managed, disease processes are understood patient agrees and understand that discharge will take place

## 2022-06-02 ENCOUNTER — Encounter: Admit: 2022-06-02 | Discharge: 2022-06-02 | Payer: PRIVATE HEALTH INSURANCE | Primary: Physician Assistant

## 2022-06-02 NOTE — Home Health (Signed)
Skilled reason for visit: Wound care    Caregiver involvement: Pt resides in single story home with her daughter, amb with use of a walker, daughter is pts CG and assts with ADL's, medication, appts and transportation and is available 24/7 to asst as needed.    This visit: Pt is A&O x4, able to communicate her needs, denies pain or discomfort this visit, wound care provided as ordered, pt tolerated well, wound remains stable.    Home health supplies by type and quantity ordered/delivered this visit include:NA    Medications reviewed and all medications are available in the home this visit.    The following education was provided regarding medications: SN Instructed pt and CG not to stop, start or change the way she is currently taking any medications without consulting with the MD    MD notified of any discrepancies/look a-like medications/medication interactions: NA    Medications are effective at this time.     Patient education provided this visit: SEE INTERVENTION LIST    -SN instructed pt/CG to notify home health or physician for the following wound healing complications: increased drainage, pus or a foul odor coming from the wound, fever, muscle, joint, or body aches, sweating, increased swelling, redness or red streaks surrounding the wound; dramatic change in color or size of wound; stitches coming apart or wound reopening; increase in pain at wound site in spite of interventions.    Pt and CG acknowledged understanding of all education provided.     Patient's Progress towards personal goals: when patient reaches goals and medication is managed, disease processes are understood patient agrees and understand that discharge will take place

## 2022-06-04 ENCOUNTER — Encounter: Admit: 2022-06-04 | Discharge: 2022-06-04 | Payer: PRIVATE HEALTH INSURANCE | Primary: Physician Assistant

## 2022-06-04 NOTE — Home Health (Signed)
Skilled reason for visit: Wound care    Caregiver involvement: Pt resides in single story home with her daughter, amb with use of a walker, daughter is pts CG and assts with ADL's, medication, appts and transportation and is available 24/7 to asst as needed.    This visit: Pt is A&O x4, able to communicate her needs, denies pain or discomfort this visit, wound care provided as ordered, pt tolerated well, wound remains stable.    Home health supplies by type and quantity ordered/delivered this visit include:NA    Medications reviewed and all medications are available in the home this visit.    The following education was provided regarding medications: SN Instructed pt and CG not to stop, start or change the way she is currently taking any medications without consulting with the MD    MD notified of any discrepancies/look a-like medications/medication interactions: NA    Medications are effective at this time.     Patient education provided this visit: SEE INTERVENTION LIST    -SN instructed pt/CG to notify home health or physician for the following wound healing complications: increased drainage, pus or a foul odor coming from the wound, fever, muscle, joint, or body aches, sweating, increased swelling, redness or red streaks surrounding the wound; dramatic change in color or size of wound; stitches coming apart or wound reopening; increase in pain at wound site in spite of interventions.    Pt and CG acknowledged understanding of all education provided.     Patient's Progress towards personal goals: when patient reaches goals and medication is managed, disease processes are understood patient agrees and understand that discharge will take place

## 2022-06-06 ENCOUNTER — Encounter: Admit: 2022-06-06 | Discharge: 2022-06-06 | Payer: PRIVATE HEALTH INSURANCE | Primary: Physician Assistant

## 2022-06-06 NOTE — Home Health (Signed)
Skilled reason for visit: RECERTIFICATION FOR SACRAL WOUND CARE   Caregiver involvement:COOKS, CLEANS AND TAKES PATIENT TO MD APPT.    Medications reviewed and all medications are available in the home this visit.    The following education was provided regarding medications:  INSTRUCTED ON IMPORTANCE OF MEDICATION COMPLIANCY..    MD notified of any discrepancies/look a-like medications/medication interactions: NA  Medications are IN HOME AND EFFECTIVE  at this time.      Home health supplies by type and quantity ordered/delivered this visit include: NA    Patient education provided this visit: WOUND CARE, SEE NSG INTERVENTIONS, INSTRUCTED ON S/S OF INFECTION AT WOUND SITE. WD VAC AT 125 CONTINUOUS.    Sharps education provided: YES    Patient level of understanding of education provided:GOOD, VERBALIZED UNDERSTANDING.    Skilled Care Performed this visit:WOUND CARE TO SACRAL WOUND,  WD VAC REAPPLIED. WOUND BED BEEFY COLORED AND HEALING..SEE NSG INTERVENTIONS.    Patient response to procedure performed:  GOOD.      Patient's Progress towards personal goals: PROGRESSING.    Home exercise program: DEEP BREATHING EXERCISES TO HELP PREVENT PNEUMONIA.    Continued need for the following skills: Nursing.    Plan for next visit: [place]     Patient and/or caregiver notified and agrees to changes in the Plan of Care: Yes.     The following discharge planning was discussed with the pt/caregiver: PATIENT WILL BE DISCHARGED WHEN GOALS ARE MET AND PATIENT IS STABLE.      1. I certify that the patient needs intermittent care as follows: skilled nursing care:  wound care    2. Recertification Statement  I certify that this patient is homebound, that is: 1) patient requires the use of a wheelchair device, special transportation, or assistance of another to leave the home; or 2) patient's condition makes leaving the home medically contraindicated; and 3) patient has a normal inability to  leave the home and leaving the home requires considerable and taxing effort.  Patient may leave the home for infrequent and short duration for medical reasons, and occasional absences for non-medical reasons. Homebound status is due to the following functional limitations: Patient's ambulation limited secondary to severe pain and requires the use of an assistive device and the assistance of a caregiver for safe completion.  Patient with strength and ROM deficits limiting ambulation endurance requiring the use of an assistive device and the assistance of a caregiver.  Patient deemed temporarily homebound secondary to increased risk for infection when leaving home and going out into the community.    3. I certify that this patient is confined to his/her home and needs intermittent skilled nursing care, physical therapy and/or speech therapy or continues to need occupational therapy.  The patient is under my care, and I have authorized services on this plan of care and will periodically review the plan.        Physician Notification and Justification to Continue Services:     Justification for continued intermittent care: patient with wound care concerns as follows due to patient medical condition and area of wound, patient unable to manage wound, also use of negative pressure therapy.     Darnelle Bos notified of the following: the need for continued Skilled Nursing home health services for above reasons, plan of care including visit frequency of 2x a week Skilled Nursing for : additional assessment of  WOUND: and wound care services to sacral wound.

## 2022-06-07 ENCOUNTER — Encounter: Admit: 2022-06-07 | Discharge: 2022-06-07 | Payer: PRIVATE HEALTH INSURANCE | Primary: Physician Assistant

## 2022-06-07 NOTE — Home Health (Signed)
This visit: SN received a call from pts CG/daughter (connected by [facility] office) she stated pt was having quite a bit of discomfort in the area of her wound vac, SN out to reapply pts wound vac. Vac was bridged to left side of pts buttocks, at removal SN noticed a small red area of approx .5 cm to pts wound at 12 o'clock after cleansing and looking with proper lighting (pts home has very poor lighting) area is tunneled of 3 cm deep, wound does have a mild odor, pts PCP has been made aware and pts CG/daughter has been advised to follow up with her for further instruction. Pt voiced no further c/opain or discomfort at vac site, vac noted to be functioning properly.

## 2022-06-09 ENCOUNTER — Encounter: Payer: PRIVATE HEALTH INSURANCE | Primary: Physician Assistant

## 2022-06-09 NOTE — Home Health (Signed)
SN to pts home to check on pt and status of wound vac, pt stable and vac functioning properly, SN heard back from pts PCP w/ new order that had already been called into pharmacy that pt uses of Keflex 500mg  po tid x 10 days for wound infection. SN checked the amt of drainage in pts canister, continues to be a very small amt of serosanginous moderately odorous drainage, SN changed pts wound vac dsg again because a large amt of feces had gotten lodged up into the area where the vac drape was. SN spoke with pt and her daughter, SN is going to get with the RN and consult with the pts Surgeon about possibly d/cing the vac and changing the dressing  to possibly continue to clean w/ Vashe, apply aquacel (pack in into the tunnel as well) with honey, cover with foam silicone border dsg 3x wk and prn soilage d/t the very small size and the location of the area it is very difficult to keep the dressing clean after the pt has a bm daily.

## 2022-06-10 ENCOUNTER — Encounter: Payer: PRIVATE HEALTH INSURANCE | Primary: Physician Assistant

## 2022-06-12 ENCOUNTER — Encounter: Payer: PRIVATE HEALTH INSURANCE | Primary: Physician Assistant

## 2022-06-13 ENCOUNTER — Encounter: Admit: 2022-06-13 | Discharge: 2022-06-13 | Payer: PRIVATE HEALTH INSURANCE | Primary: Physician Assistant

## 2022-06-13 NOTE — Home Health (Signed)
Skilled reason for visit: Wound care/care. Advised daughter to schedule follow up appointment with MD as wound vac may be d/c'd due to decrease in size. Daughter stated patient was started on abx due to foul odor noted from wound.  Wound noted 1.5 x 1.5 x 0.3 cm. Patient had 117m of drainage in canister but drainage is thin which may be mixed with urine and stool as patient has incontinence. PCP ordered antibiotics for patient due to foul odor, case communication sent to Dr. YKathlen Modyto advise due to next upcoming appointment not until December.     Caregiver involvement: daughter assist with ADLs, meal prep, meds and transportation.    Medications reviewed and all medications are available in the home this visit.    The following education was provided regarding medications:  Keflex is a medication is used to treat a wide variety of bacterial infections. This medication is known as a cephalosporin antibiotic. It works by stopping the growth of bacteria.This medication will not work for viral infections (such as common cold, flu). Unnecessary use or misuse of any antibiotic can lead to its decreased effectiveness.    MD notified of any discrepancies/look a-like medications/medication interactions: none  Medications are effective at this time.      Home health supplies by type and quantity ordered/delivered this visit include: n/a    Patient education provided this visit: see intervention    Sharps education provided: Clinician instructed patient/CG on proper disposal of sharps: Containers should be made of hard plastic, be puncture-resistant and leakproof, such as a laundry detergent or bleach bottle. When the container is  full, it should be sealed with tape and labeled DO NOT RECYCLE prior to discarding in the regular trash.       Patient level of understanding of education provided: Patient and caregiver verbalized understanding      Patient response to procedure performed:  Patient tolerated w/o any  increased level of pain      Agency Progress toward goals: progressing    Patient's Progress towards personal goals: progressing    Home exercise program: Take all medications as prescribed, follow all fall precaution measures, attend all future medical appointments      Continued need for the following skills: Nursing.    Plan for next visit: wound care    Patient and/or caregiver notified and agrees to changes in the Plan of Care: Yes.     The following discharge planning was discussed with the pt/caregiver: Discharge when goals are met, patient/caregiver able to manage disease process, medications, and pain

## 2022-06-16 ENCOUNTER — Encounter: Payer: PRIVATE HEALTH INSURANCE | Primary: Physician Assistant

## 2022-06-17 ENCOUNTER — Encounter: Admit: 2022-06-17 | Discharge: 2022-06-17 | Payer: PRIVATE HEALTH INSURANCE | Primary: Physician Assistant

## 2022-06-17 NOTE — Home Health (Signed)
Skilled reason for visit: wound care/vac, patient noted with small open area but draining from two small pin sized openings in wound.     Caregiver involvement: daughter assist with ADLs, meal prep, meds and transportation.    Medications reviewed and all medications are available in the home this visit.    The following education was provided regarding medications:   SN instructed on proper method of medication intake, as many people taking prescription medications do not follow their doctors orders. SN instruct on medication compliance to better control the patients disease process, to refill medication on time to prevent missed/skipped doses. do not take any medication that does not belong to you. Also ask your doctor before taking any over the counter medication to avoid interactions. Patient verbalized understanding.    .    MD notified of any discrepancies/look a-like medications/medication interactions: none  Medications are effective at this time.      Home health supplies by type and quantity ordered/delivered this visit include: n/a    Patient education provided this visit: see intervention    Sharps education provided: Clinician instructed patient/CG on proper disposal of sharps: Containers should be made of hard plastic, be puncture-resistant and leakproof, such as a laundry detergent or bleach bottle. When the container is  full, it should be sealed with tape and labeled DO NOT RECYCLE prior to discarding in the regular trash.       Patient level of understanding of education provided: Patient and caregiver verbalized understanding      Patient response to procedure performed:  Patient tolerated w/o any increased level of pain      Agency Progress toward goals: progressing    Patient's Progress towards personal goals: progressing    Home exercise program: Take all medications as prescribed, follow all fall precaution measures, attend all future medical appointments      Continued need for the  following skills: Nursing.    Plan for next visit: wound care    Patient and/or caregiver notified and agrees to changes in the Plan of Care: Yes.     The following discharge planning was discussed with the pt/caregiver: Discharge when goals are met, patient/caregiver able to manage disease process, medications, and pain

## 2022-06-17 NOTE — Home Health (Signed)
Skilled reason for visit: wound care    Caregiver involvement: daughter assist with ADLs, meal prep , meds and transportation.    Medications reviewed and all medications are available in the home this visit.    The following education was provided regarding medications:   SN instructed on proper method of medication intake, as many people taking prescription medications do not follow their doctors orders. SN instruct on medication compliance to better control the patients disease process, to refill medication on time to prevent missed/skipped doses. do not take any medication that does not belong to you. Also ask your doctor before taking any over the counter medication to avoid interactions. Patient verbalized understanding.    .    MD notified of any discrepancies/look a-like medications/medication interactions: none  Medications are effective at this time.      Home health supplies by type and quantity ordered/delivered this visit include: n/a    Patient education provided this visit: see intervention    Sharps education provided: Clinician instructed patient/CG on proper disposal of sharps: Containers should be made of hard plastic, be puncture-resistant and leakproof, such as a laundry detergent or bleach bottle. When the container is  full, it should be sealed with tape and labeled DO NOT RECYCLE prior to discarding in the regular trash.       Patient level of understanding of education provided: Patient and caregiver verbalized understanding      Patient response to procedure performed:  Patient tolerated w/o any increased level of pain      Agency Progress toward goals: progressing    Patient's Progress towards personal goals: progressing    Home exercise program: Take all medications as prescribed, follow all fall precaution measures, attend all future medical appointments      Continued need for the following skills: Nursing.    Plan for next visit: wound care    Patient and/or caregiver notified and  agrees to changes in the Plan of Care: Yes.     The following discharge planning was discussed with the pt/caregiver: Discharge when goals are met, patient/caregiver able to manage disease process, medications, and pain

## 2022-06-18 NOTE — Unmapped (Signed)
 Recertification Statement                           1. I certify that the patient needs intermittent care as follows: skilled nursing care:  teaching/training of DM, pressure injury disease process and medication management, skilled observation/assessment, patient education, complex care plan management, and wound care    2. I certify that this patient is homebound, that is: 1) patient requires the use of a walker device, special transportation, or assistance of another to leave the home; or 2) patient's condition makes leaving the home medically contraindicated; and 3) patient has a normal inability to leave the home and leaving the home requires considerable and taxing effort.  Patient may leave the home for infrequent and short duration for medical reasons, and occasional absences for non-medical reasons. Homebound status is due to the following functional limitations: Patient's ambulation limited secondary to severe pain and requires the use of an assistive device and the assistance of a caregiver for safe completion.  Patient with strength and ROM deficits limiting ambulation endurance requiring the use of an assistive device and the assistance of a caregiver.  Patient deemed temporarily homebound secondary to increased risk for infection when leaving home and going out into the community.  Patient with strength deficits limiting the performance of all ADL's without caregiver assistance or the use of an assistive device.    3. I certify that this patient is confined to his/her home and needs intermittent skilled nursing care, physical therapy and/or speech therapy or continues to need occupational therapy.  The patient is under my care, and I have authorized services on this plan of care and will periodically review the plan.

## 2022-06-19 ENCOUNTER — Encounter: Admit: 2022-06-19 | Discharge: 2022-06-19 | Payer: PRIVATE HEALTH INSURANCE | Primary: Physician Assistant

## 2022-06-19 NOTE — Home Health (Signed)
Skilled reason for visit: wound care, wound vac d/c this visit, patient transitioned to aquacel, foam,and tape, small drainage noted. Daughter demostrated proper wound care technique.    Caregiver involvement: daughter assist with ADLs, meal prep, meds and transportation.    Medications reviewed and all medications are available in the home this visit.    The following education was provided regarding medications:   SN instructed on proper method of medication intake, as many people taking prescription medications do not follow their doctors orders. SN instruct on medication compliance to better control the patients disease process, to refill medication on time to prevent missed/skipped doses. do not take any medication that does not belong to you. Also ask your doctor before taking any over the counter medication to avoid interactions. Patient verbalized understanding.    .    MD notified of any discrepancies/look a-like medications/medication interactions: none  Medications are effective at this time.      Home health supplies by type and quantity ordered/delivered this visit include: foam dressing, aquacel, gauze, vashe, zinc paster    Patient education provided this visit: see intervention    Sharps education provided:Clinician instructed patient/CG on proper disposal of sharps: Containers should be made of hard plastic, be puncture-resistant and leakproof, such as a laundry detergent or bleach bottle. When the container is  full, it should be sealed with tape and labeled DO NOT RECYCLE prior to discarding in the regular trash.       Patient level of understanding of education provided: Patient and caregiver verbalized understanding      Patient response to procedure performed:  Patient tolerated w/o any increased level of pain      Agency Progress toward goals: progressing    Patient's Progress towards personal goals: progressing    Home exercise program: Take all medications as prescribed, follow all fall  precaution measures, attend all future medical appointments      Continued need for the following skills: Nursing.    Plan for next visit: wound care    Patient and/or caregiver notified and agrees to changes in the Plan of Care: Yes.     The following discharge planning was discussed with the pt/caregiver: Discharge when goals are met, patient/caregiver able to manage disease process, medications, and pain

## 2022-06-20 ENCOUNTER — Emergency Department: Admit: 2022-06-20 | Payer: PRIVATE HEALTH INSURANCE | Primary: Physician Assistant

## 2022-06-20 ENCOUNTER — Emergency Department: Admit: 2022-06-21 | Payer: PRIVATE HEALTH INSURANCE | Primary: Physician Assistant

## 2022-06-20 ENCOUNTER — Inpatient Hospital Stay
Admission: EM | Admit: 2022-06-20 | Discharge: 2022-07-04 | Disposition: A | Discharge status: Skilled Nursing Facility | Payer: PRIVATE HEALTH INSURANCE | Admitting: Student in an Organized Health Care Education/Training Program

## 2022-06-20 DIAGNOSIS — A419 Sepsis, unspecified organism: Secondary | ICD-10-CM

## 2022-06-20 DIAGNOSIS — M4628 Osteomyelitis of vertebra, sacral and sacrococcygeal region: Secondary | ICD-10-CM

## 2022-06-20 LAB — CBC WITH AUTO DIFFERENTIAL
Basophils %: 0 % (ref 0–2)
Basophils Absolute: 0 10*3/uL (ref 0.0–0.1)
Eosinophils %: 0 % (ref 0–5)
Eosinophils Absolute: 0 10*3/uL (ref 0.0–0.4)
Hematocrit: 27.8 % — ABNORMAL LOW (ref 35.0–45.0)
Hemoglobin: 8.4 g/dL — ABNORMAL LOW (ref 12.0–16.0)
Immature Granulocytes %: 1 % — ABNORMAL HIGH (ref 0.0–0.5)
Immature Granulocytes Absolute: 0.1 10*3/uL — ABNORMAL HIGH (ref 0.00–0.04)
Lymphocytes %: 5 % — ABNORMAL LOW (ref 21–52)
Lymphocytes Absolute: 0.5 10*3/uL — ABNORMAL LOW (ref 0.9–3.6)
MCH: 22.2 pg — ABNORMAL LOW (ref 24.0–34.0)
MCHC: 30.2 g/dL — ABNORMAL LOW (ref 31.0–37.0)
MCV: 73.4 FL — ABNORMAL LOW (ref 78.0–100.0)
MPV: 9.1 FL — ABNORMAL LOW (ref 9.2–11.8)
Monocytes %: 7 % (ref 3–10)
Monocytes Absolute: 0.8 10*3/uL (ref 0.05–1.2)
Neutrophils %: 87 % — ABNORMAL HIGH (ref 40–73)
Neutrophils Absolute: 9.8 10*3/uL — ABNORMAL HIGH (ref 1.8–8.0)
Nucleated RBCs: 0 /100{WBCs}
Platelets: 354 10*3/uL (ref 135–420)
RBC: 3.79 M/uL — ABNORMAL LOW (ref 4.20–5.30)
RDW: 18.4 % — ABNORMAL HIGH (ref 11.6–14.5)
WBC: 11.3 10*3/uL (ref 4.6–13.2)
nRBC: 0 10*3/uL (ref 0.00–0.01)

## 2022-06-20 MED ORDER — PIPERACILLIN SOD-TAZOBACTAM SO 4.5 (4-0.5) G IV SOLR
4.5 (4-0.5) g | Freq: Once | INTRAVENOUS | Status: AC
Start: 2022-06-20 — End: 2022-06-20
  Administered 2022-06-20: 4500 mg via INTRAVENOUS

## 2022-06-20 MED ORDER — VANCOMYCIN 1500 MG NS 500 ML (PREMIX) IVPB
Freq: Once | Status: AC
Start: 2022-06-20 — End: 2022-06-21
  Administered 2022-06-21: 06:00:00 1500 mg via INTRAVENOUS

## 2022-06-20 MED ORDER — ACETAMINOPHEN 325 MG PO TABS
325 MG | ORAL | Status: AC
Start: 2022-06-20 — End: 2022-06-20
  Administered 2022-06-20: 650 mg via ORAL

## 2022-06-20 MED ORDER — SODIUM CHLORIDE 0.9 % IV BOLUS
0.9 % | Freq: Once | INTRAVENOUS | Status: AC
Start: 2022-06-20 — End: 2022-06-20
  Administered 2022-06-20: 1000 mL via INTRAVENOUS

## 2022-06-20 MED FILL — PIPERACILLIN SOD-TAZOBACTAM SO 4.5 (4-0.5) G IV SOLR: 4.5 (4-0.5) g | INTRAVENOUS | Qty: 4500

## 2022-06-20 MED FILL — SODIUM CHLORIDE 0.9 % IV SOLN: 0.9 % | INTRAVENOUS | Qty: 1000

## 2022-06-20 MED FILL — ACETAMINOPHEN 325 MG PO TABS: 325 MG | ORAL | Qty: 2

## 2022-06-20 NOTE — ED Notes (Signed)
Report given to [facility].     Alwyn Pea, RN  06/20/22 1926

## 2022-06-20 NOTE — ED Provider Notes (Addendum)
EMERGENCY DEPARTMENT HISTORY AND PHYSICAL EXAM      Date: 06/20/2022  Patient Name: [name] Short    History of Presenting Illness     Chief Complaint   Patient presents with    Fatigue       History (Context): [name] Short is a 66 y.o. female with significant past medical history of CVA, hyperlipidemia, anemia, DM, htn, sleep apnea presents via ambulance to the ED today. Patient's daughter at bedside. Pt is currently receiving home health for dressing changes of sacral ulcer.  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 Daughter reports increased drainage from wound lately. Patient reports feeling more fatigued than normal.  Denies cough, congestion, vomiting, diarrhea, fever, chills.  Denies dysuria, hematuria.  Daughter states patient has been taking Keflex to prevent infection of ulcer.  Daughter also states patient's blood sugars have been running higher than normal. Pt also reports a fall today, landing up buttock.       PCP: Pearlean Brownie D, PA    No current facility-administered medications for this encounter.     Current Outpatient Medications   Medication Sig Dispense Refill    cephALEXin (KEFLEX) 500 MG capsule Take 500 mg by mouth 3 times daily. Admin 1 tab po tid x 10 days for sacral wound infection      baclofen (LIORESAL) 10 MG tablet Take 1 tablet by mouth 3 times daily as needed (PAIN) Indications: Pain FOR PAIN PER SHETH      Magnesium Oxide 400 MG CAPS Take 1 capsule by mouth daily.      Potassium Chloride Crys ER (KLOR-CON M10 PO) Take 1 tablet by mouth daily.      cinacalcet (SENSIPAR) 30 MG tablet Take 1 tablet by mouth in the morning and 1 tablet in the evening.      aspirin 325 MG tablet Take 1 tablet by mouth daily      acetaminophen (TYLENOL) 500 MG tablet Take 1 tablet by mouth every 6 hours as needed for Fever or Pain 30 tablet 0    insulin glargine (LANTUS) 100 UNIT/ML injection vial Inject 34 Units into the skin daily 10 mL 0    insulin  lispro (HUMALOG) 100 UNIT/ML SOLN injection vial Inject 4 Units into the skin 3 times daily (with meals) 1 each 0    insulin lispro (HUMALOG) 100 UNIT/ML SOLN injection vial Sliding Scale insulin as per Institutional protocol 1 each 0    lactobacillus delayed release capsule Take 1 capsule by mouth daily (with breakfast) 15 capsule 0    hydrALAZINE (APRESOLINE) 10 MG tablet Take 1 tablet by mouth every 6 hours as needed (As needed for systolic blood pressure greater than 160) 90 tablet 0    amLODIPine (NORVASC) 5 MG tablet Take 1 tablet by mouth daily (Patient not taking: Reported on 04/16/2022) 30 tablet 0    aspirin 81 MG chewable tablet Take 1 tablet by mouth daily 30 tablet 3    atorvastatin (LIPITOR) 80 MG tablet Take 1 tablet by mouth nightly 30 tablet 3    ferrous sulfate (IRON 325) 325 (65 Fe) MG tablet Take 1 tablet by mouth daily 30 tablet     Multiple Vitamins-Minerals (THERAPEUTIC MULTIVITAMIN-MINERALS) tablet Take 1 tablet by mouth daily      furosemide (LASIX) 40 MG tablet furosemide 40 mg tablet   TAKE 1 TABLET BY MOUTH EVERY DAY         Past History  Past Medical History:   Past Medical History:   Diagnosis Date    Anemia     Arthritis     Chronic pain     Legs and Shoulders    Diabetes (HCC)     Exposure to asbestos     History of blood transfusion     History of colon polyps     HTN (hypertension)     Hx of blood clots     DVT    Hyperlipemia     Menopause     Pulmonary nodule     Serum calcium elevated     Sleep apnea     not using cpap    Stroke [facility])        Past Surgical History:  Past Surgical History:   Procedure Laterality Date    CESAREAN SECTION  1987    COLONOSCOPY N/A 01/13/2017    COLONOSCOPY performed by Levan Hurst, MD at [facility]    COLONOSCOPY      DILATION AND CURETTAGE OF UTERUS  1995    RECTAL SURGERY N/A 12/28/2021    SACRAL WOUND DEBRIDEMENT INCISION AND DRAINAGE performed by Phineas Semen, DO at [facility] MAIN OR    SPINE SURGERY N/A 12/09/2021    CERVICAL  THREE/FOUR/FIVE/SIX LAMINECTOMY FUSION; C-ARM; STRYKER; EXT BONE GROWTH STIM; 23 HR performed by Pamalee Leyden, MD at [facility] MAIN OR       Family History:  Family History   Problem Relation Age of Onset    Diabetes Other 65        parent, NOS    Cancer Father     Hypertension Other 35        parent,NOS    Heart Disease Other 33        parent, NOS    Lung Disease Father        Social History:   Social History     Tobacco Use    Smoking status: Former     Packs/day: 1     Types: Cigarettes, Cigars     Quit date: 11/13/2021     Years since quitting: 0.6    Smokeless tobacco: Never   Vaping Use    Vaping Use: Never used   Substance Use Topics    Alcohol use: Not Currently     Comment: occ    Drug use: Never       Allergies:  Allergies   Allergen Reactions    Lactose Other (See Comments)     Intolerance       PMH, PSH, family history, social history, allergies reviewed with the patient with significant items noted above.  Review of Systems   Review of Systems   Constitutional:  Positive for fatigue. Negative for activity change.   HENT:  Negative for congestion and sore throat.    Respiratory:  Negative for shortness of breath.    Cardiovascular:  Negative for chest pain.   Gastrointestinal:  Negative for abdominal pain, diarrhea and vomiting.   Genitourinary:  Negative for dysuria and vaginal discharge.   Musculoskeletal:  Negative for arthralgias and myalgias.   Skin:  Negative for wound.   Neurological:  Negative for dizziness, syncope and headaches.   All other systems reviewed and are negative.      Physical Exam     Vitals:    06/20/22 1718   BP: (!) 159/111   Pulse: (!) 109   Resp: 17   Temp: (!) 102 F (38.9 C)  TempSrc: Oral   SpO2: 100%   Weight: 66.7 kg (147 lb)   Height: 1.499 m (4\' 11" )       Physical Exam  Vitals and nursing note reviewed.   Constitutional:       Appearance: Normal appearance.      Comments: Pt in NAD   HENT:      Head: Normocephalic and atraumatic.   Eyes:      General: No scleral icterus.      Comments: PERRL  EOM intact   Cardiovascular:      Rate and Rhythm: Regular rhythm. Tachycardia present.      Pulses: No decreased pulses.   Pulmonary:      Effort: Pulmonary effort is normal.      Breath sounds: Normal breath sounds and air entry.      Comments: Lungs CTA  Not working to breathe  Abdominal:      General: There is no distension.      Palpations: Abdomen is soft.      Tenderness: There is no abdominal tenderness.   Musculoskeletal:      Cervical back: Full passive range of motion without pain.      Right lower leg: No edema.      Left lower leg: No edema.   Skin:     General: Skin is warm and dry.      Capillary Refill: Capillary refill takes less than 2 seconds.      Comments: Ulcer visualized to sacrum. Unable to determine depth. TTP. Pustular drainage.    Neurological:      Mental Status: She is alert and oriented to person, place, and time. Mental status is at baseline.   Psychiatric:         Attention and Perception: Attention normal.         Diagnostic Study Results     Labs -   No results found for this or any previous visit (from the past 12 hour(s)).   Labs Reviewed   CULTURE, BLOOD 1   CULTURE, BLOOD 2   CBC WITH AUTO DIFFERENTIAL   COMPREHENSIVE METABOLIC PANEL   PROCALCITONIN   TROPONIN   URINALYSIS   LACTIC ACID   POCT GLUCOSE       Radiologic Studies -   XR CHEST PORTABLE    (Results Pending)         The laboratory results, imaging results, and other diagnostic exams were reviewed in the EMR.    Medical Decision Making   I am the first provider for this patient.    I reviewed the vital signs, available nursing notes, past medical history, past surgical history, family history and social history.    Vital Signs-Reviewed the patient's vital signs.    EKG: Sinus tachycardia. No acute ischemic changes.       Records Reviewed: Personally, on initial evaluation    MDM:   DDX includes but is not limited to: Sepsis, Infected ulcer, COVID, Influenza, UTI, PNA, Sacral fracture    Will obtain  lab work and imaging for further evaluation of patients complaint. Will continue to monitor and evaluate patient while in the ED.      Orders as below:  Orders Placed This Encounter   Procedures    Blood Culture 1    Blood Culture 2    XR CHEST PORTABLE    CBC with Auto Differential    Comprehensive Metabolic Panel    Procalcitonin    Troponin    Urinalysis  Lactic Acid    Height and weight    Neurologic Status Assessment    Strict intake and output    Vital Signs Per Unit Routine    POCT Glucose    EKG 12 Lead    Saline lock IV        [name] Short presents with complaint of fatigue x 2 days. Hx stage 4 sacral ulcer. On exam, purulent drainage from area.  Patient initially febrile and tachycardic. No leukocytosis. Elevated lactic acid. Weight based fluids ordered.     ED Course:        Is this patient to be included in the SEP-1 core measure? Yes SEP-1 CORE MEASURE DATA      Sepsis Criteria   Severe Sepsis Criteria   Septic Shock Criteria       Must meet 2:    [x] Temp >100.9 F (38.3 C) or < 96.8 F (36 C)  [x] HR > 90  [] RR > 20  [] WBC > 12 or < 4 or 10% bands    AND:    [x]  Infection Confirmed or Suspected.     Must meet 1:    [x] Lactate > 2       or   [] Signs of Organ Dysfunction:    - SBP < 90 or MAP < 65  -Creatinine > 2 or increased from baseline  -Urine Output < 0.5 ml/kg/hr  -Bilirubin > 2  -INR > 1.5 (not anticoagulated)  -Platelets < 100,000  -Acute Respiratory Failure as evidenced by new need for NIPPV or mechanical ventilation   Must meet 1:    [] Lactate > 4        or   [] SBP < 90 or MAP < 65 for at least two readings in the first hour after fluid bolus administration    [] Vasopressors initiated (if hypotension persists after fluid resuscitation)   Patient Vitals for the past 6 hrs:   BP Temp Pulse Resp SpO2 Height Weight Weight Method Percent Weight Change   06/20/22 1718 (!) 159/111 (!) 102 F (38.9 C) (!) 109 17 100 % 1.499 m (4\' 11" ) 66.7 kg (147 lb) Estimated 0      No results for input(s): "WBC",  "LACTA", "LACTACIDWB", "CREATININE", "BILITOT", "INR", "PLT" in the last 72 hours.     Severe sepsis identified date: 11/17 time: 1924    Fluid Resuscitation Rationale: at least 75mL/kg based on entered actual weight at time of triage    Repeat lactate level: ordered and pending at this time      Procedures:  Procedures     Critical Care Time:  The services I provided to this patient were to treat and/or prevent clinically significant deterioration that could result in the failure of one or more body systems and/or organ systems due to Sepsis    Services included the following:  -reviewing nursing notes and old charts  -vital sign assessments  -direct patient care  -medication orders and management  -interpreting and reviewing diagnostic studies/labs  -re-evaluations  -documentation time    Aggregate critical care time was 35 minutes, which includes only time during which I was engaged in work directly related to the patient's care as described above, whether I was at bedside or elsewhere in the Emergency Department. It did not include time spent performing other reported procedures or the services of residents, students, or nurses.    Collier Flowers, PA-C        Documentation/Prior Results Review:  Old medical records.  Nursing notes.  7:25 PM : Pt care transferred to Rica Koyanagi  ,ED provider. History of patient complaint(s), available diagnostic reports and current treatment plan has been discussed thoroughly.   Bedside rounding on patient occured : No .  Intended disposition of patient : Admission  Pending diagnostics reports and/or labs (please list): CT scan        Dragon Disclaimer     Please note that this dictation was completed with Dragon, the computer voice recognition software.  Quite often unanticipated grammatical, syntax, homophones, and other interpretive errors are inadvertently transcribed by the computer software.  Please disregard these errors.  Please excuse any errors that have escaped  final proofreading.      [address] PA-C       Paulo Fruit, [place]  06/20/22 [place], [place], [place]  07/12/22 [phone]

## 2022-06-20 NOTE — ED Notes (Signed)
8:000 PM Assumed care of the patient at this time. Discussed with PA Olivia Mackie concerning patient [name] Short, standard discussion of reason for visit, HPI, ROS, PE, and current results available.  Recommendation for obtaining pending CT imaging followed by bedside re-evaluation to dispo the pt.     Krystyn Picking PA-C      9:42 PM CT resulted, CT head unremarkable however the CT abdomen pelvis is consistent with a large ill-defined fluid and gas collection in the subcutaneous fat overlying the sacrum concerning for phlegmon versus early abscess.  There is extension into the sacrum with erosion consistent with osteomyelitis.  I have seen and reevaluated at bedside.  Imaging of the patient's wound is available below.  Patient's repeat temperature is 100.2 orally.  She is still tachycardic around 120.  Her lactic acid has improved after IV fluids and antibiotics.  I have consulted with the on-call surgeon Dr. Bridget Hartshorn who recommends admission to medicine for IV antibiotics and he will see the patient in the morning.  No n.p.o. status recommended at this time.    Consulted with the on-call hospitalist Dr. Marta Antu who recommends admission to stepdown.  Mission orders have been placed.        Disposition: admit    Dictation disclaimer:  Please note that this dictation was completed with Dragon, the computer voice recognition software.  Quite often unanticipated grammatical, syntax, homophones, and other interpretive errors are inadvertently transcribed by the computer software.  Please disregard these errors.  Please excuse any errors that have escaped final proofreading.       [place], Nakyiah Kuck J, [place]  06/20/22 2159

## 2022-06-20 NOTE — ED Notes (Signed)
Unsuccessful IV attempt, tech aware to attempt ultrasound IV.     Alwyn Pea, RN  06/20/22 1805

## 2022-06-20 NOTE — H&P (Signed)
History and Physical          Subjective     HPI: Gorgeous Newlun is a 66 y.o. female with a PMHx of arthritis, DMT2, hypertension, hyperlipidemia, sleep apnea noncompliant with CPAP, stroke who presented to the ED with daughter.  Patient is currently receiving home health for sacral ulcer dressing changes.  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  Daughter reports increased drainage from wound since last week. States wound vac was taken out last week and has noticed increased drainage since then. Patient reports feeling increased fatigued.  According to the daughter patient has been taking Keflex to prevent infection of the ulcer, but has not helped in her opinion. Patient has had sacral wound I&D in the past by Dr Bing Ree.  Denies any associated fever or chills.  Daughter states her mother is not getting the appropriate treatment that she should be receiving. States next appointment with Dr Bing Ree is 07/15/2022. Along with this patient also reports a fall today, landing on her buttocks.  Denies LOC or hitting head.  Denies chest pain, shortness of breath, nausea vomiting diarrhea, abdominal pain, headache, dizziness, cough.  Denies smoking drinking or illicit drug use.    In the ED, temp 102.0--> 100.2, respiration 17, heart rate 118, blood pressure 159/111, 100% on room air.  CMP without significant abnormality.  Lactic acid 2.2--> 0.8.  Pro-Cal 0.79.  Troponin negative.  Glucose 200.  Hemoglobin 8.4, hematocrit 27.8, MCV 73.4.  Blood cultures x2 obtained.  Respiratory viral panel negative.  Chest x-ray with mild pulmonary edema.  CT head with no acute intracranial abnormality.  CT abdomen and pelvis with large ill-defined fluid and gas collection in the subcutaneous fat overlying the sacrum concerning for phlegmon or early abscess.  Possible osteomyelitis.  EKG with sinus tachycardia.  General surgery consulted by the ED.  Received Tylenol, Zosyn, 1 L normal saline  bolus in the ED.  Has vancomycin ordered but has not received yet.    Home medications reconciled with the patient and daughter at bedside   CODE STATUS discussed-FULL code     PMHx:  Past Medical History:   Diagnosis Date    Anemia     Arthritis     Chronic pain     Legs and Shoulders    Diabetes ([place])     Exposure to asbestos     History of blood transfusion     History of colon polyps     HTN (hypertension)     Hx of blood clots     DVT    Hyperlipemia     Menopause     Pulmonary nodule     Serum calcium elevated     Sleep apnea     not using cpap    Stroke [facility])        PSurgHx:  Past Surgical History:   Procedure Laterality Date    CESAREAN SECTION  1987    COLONOSCOPY N/A 01/13/2017    COLONOSCOPY performed by Joycelyn Schmid, MD at [place] N/A 12/28/2021    SACRAL WOUND DEBRIDEMENT INCISION AND DRAINAGE performed by Nestor Ramp, DO at [place] N/A 12/09/2021    CERVICAL THREE/FOUR/FIVE/SIX LAMINECTOMY FUSION; C-ARM; STRYKER; EXT BONE GROWTH STIM; 23 HR performed by Candi Leash, MD at [facility]  MAIN OR       SocialHx:  Social History     Socioeconomic History    Marital status: Single   Tobacco Use    Smoking status: Former     Packs/day: 1     Types: Cigarettes, Cigars     Quit date: 11/13/2021     Years since quitting: 0.6    Smokeless tobacco: Never   Vaping Use    Vaping Use: Never used   Substance and Sexual Activity    Alcohol use: Not Currently     Comment: occ    Drug use: Never     Social Determinants of Health     Financial Resource Strain: Low Risk  (01/05/2022)    Overall Financial Resource Strain (CARDIA)     Difficulty of Paying Living Expenses: Not hard at all   Food Insecurity: No Food Insecurity (01/05/2022)    Hunger Vital Sign     Worried About Running Out of Food in the Last Year: Never true     Ran Out of Food in the Last Year: Never true   Transportation Needs: No Transportation Needs (02/09/2022)     OASIS A1250: Transportation     Lack of Transportation (Medical): No     Lack of Transportation (Non-Medical): No     Patient Unable or Declines to Respond: No   Physical Activity: Inactive (01/05/2022)    Exercise Vital Sign     Days of Exercise per Week: 0 days     Minutes of Exercise per Session: 0 min   Stress: No Stress Concern Present (01/05/2022)    [place]     Feeling of Stress : Not at all   Social Connections: Feeling Socially Integrated (02/09/2022)    OASIS D0700: Social Isolation     Frequency of experiencing loneliness or isolation: Never   Recent Concern: Social Connections - Moderately Isolated (01/05/2022)    Social Connection and Isolation Panel [NHANES]     Frequency of Communication with Friends and Family: Three times a week     Frequency of Social Gatherings with Friends and Family: More than three times a week     Attends Religious Services: More than 4 times per year     Active Member of Clubs or Organizations: No     Attends Archivist Meetings: Never     Marital Status: Widowed   Intimate Partner Violence: Not At Risk (01/05/2022)    Humiliation, Afraid, Rape, and Kick questionnaire     Fear of Current or Ex-Partner: No     Emotionally Abused: No     Physically Abused: No     Sexually Abused: No   Housing Stability: Low Risk  (01/05/2022)    Housing Stability Vital Sign     Unable to Pay for Housing in the Last Year: No     Number of Places Lived in the Last Year: 1     Unstable Housing in the Last Year: No       FamilyHx:  Family History   Problem Relation Age of Onset    Diabetes Other 68        parent, NOS    Cancer Father     Hypertension Other 35        parent,NOS    Heart Disease Other 55        parent, NOS    Lung Disease Father        Home  Medications:  Prior to Admission medications    Medication Sig Start Date End Date Taking? Authorizing Provider   baclofen (LIORESAL) 10 MG tablet Take 1 tablet by mouth 3 times  daily as needed (PAIN) Indications: Pain FOR PAIN PER SHETH 04/07/22   Jettie Booze D, PA   Magnesium Oxide 400 MG CAPS Take 1 capsule by mouth daily.    [provider]   Potassium Chloride Crys ER (KLOR-CON M10 PO) Take 1 tablet by mouth daily.    [provider]   cinacalcet (SENSIPAR) 30 MG tablet Take 1 tablet by mouth in the morning and 1 tablet in the evening.    [provider]   aspirin 325 MG tablet Take 1 tablet by mouth daily    [provider]   acetaminophen (TYLENOL) 500 MG tablet Take 1 tablet by mouth every 6 hours as needed for Fever or Pain 01/23/22   Michel Bickers, MD   insulin glargine (LANTUS) 100 UNIT/ML injection vial Inject 34 Units into the skin daily  Patient taking differently: Inject 30 Units into the skin daily 01/23/22   Michel Bickers, MD   insulin lispro (HUMALOG) 100 UNIT/ML SOLN injection vial Inject 4 Units into the skin 3 times daily (with meals) 01/23/22   Michel Bickers, MD   insulin lispro (HUMALOG) 100 UNIT/ML SOLN injection vial Sliding Scale insulin as per Institutional protocol 01/23/22   Michel Bickers, MD   atorvastatin (LIPITOR) 80 MG tablet Take 1 tablet by mouth nightly 01/06/22   Layne Benton, MD   ferrous sulfate (IRON 325) 325 (65 Fe) MG tablet Take 1 tablet by mouth daily 01/06/22   Layne Benton, MD   Multiple Vitamins-Minerals (THERAPEUTIC MULTIVITAMIN-MINERALS) tablet Take 1 tablet by mouth daily 01/07/22   Layne Benton, MD   furosemide (LASIX) 40 MG tablet furosemide 40 mg tablet   TAKE 1 TABLET BY MOUTH EVERY DAY 09/23/21   [provider]       Allergies:  Allergies   Allergen Reactions    Lactose Other (See Comments)     Intolerance        Review of Systems:  CONST: no weight loss/gain, no fever or chills, + fatigue  Eyes: No change in vision, no itching or drainage  ENT: No earache, no tinnitus, no sore throat or sinus congestion.   PULM: No shortness of breath, no cough or wheeze.    CV: no pnd or orthopnea, no CP, no palpitations, no edema  GI: No abdominal pain, no nausea, no vomiting or diarrhea  GU: No urinary frequency, no urgency, no hesitancy or dysuria.   MSK: No joint or muscle pain, no back pain, no neck pain, no recent trauma.   INTEG: No rash, no itching, no lesions, + sacral wound with drainage.   ENDO: No polyuria, no polydipsia, no heat or cold intolerance.   HEME: No anemia or easy bruising or bleeding.   NEURO: No headache, no dizziness, no seizures, no numbness, no tingling or weakness.   PSYCH: No anxiety, no depression      Objective     Physical Exam:  Visit Vitals  BP (!) 159/111   Pulse (!) 118   Temp 100.2 F (37.9 C)   Resp 17   Ht 1.499 m (_0 )   Wt 66.7 kg (147 lb)   SpO2 100%   BMI 29.69 kg/m       General: NAD, appears stated age, alert  Skin: warm, dry, no rashes, sacral wound  with drsg in place currently, but see pic below   Eyes: PERRL, sclera is non-icteric  HENT: normocephalic/atraumatic, moist mucus membranes  Respiratory: CTA with no signs of respiratory distress  Cardiovascular: tachycardia, no m/r/g, no cyanosis + 1-2 BLE ankle edema  GI: soft, non-tender, normal bowel sounds  Neuro: moves all extremities, no focal deficits, normal speech  Psych: appropriate mood and affect, no visual or auditory hallucinations          Laboratory Studies:  Recent Results (from the past 24 hour(s))   EKG 12 Lead    Collection Time: 06/20/22  5:58 PM   Result Value Ref Range    Ventricular Rate 112 BPM    Atrial Rate 112 BPM    P-R Interval 130 ms    QRS Duration 74 ms    Q-T Interval 312 ms    QTc Calculation (Bazett) 425 ms    P Axis 65 degrees    R Axis 26 degrees    T Axis 61 degrees    Diagnosis       Sinus tachycardia  Otherwise normal ECG  When compared with ECG of 26-Dec-2021 20:27,  No significant change was found     CBC with Auto Differential    Collection Time: 06/20/22  6:20 PM   Result Value Ref Range    WBC 11.3 4.6 - 13.2 K/uL    RBC 3.79 (L) 4.20 -  5.30 M/uL    Hemoglobin 8.4 (L) 12.0 - 16.0 g/dL    Hematocrit 27.8 (L) 35.0 - 45.0 %    MCV 73.4 (L) 78.0 - 100.0 FL    MCH 22.2 (L) 24.0 - 34.0 PG    MCHC 30.2 (L) 31.0 - 37.0 g/dL    RDW 18.4 (H) 11.6 - 14.5 %    Platelets 354 135 - 420 K/uL    MPV 9.1 (L) 9.2 - 11.8 FL    Nucleated RBCs 0.0 0 PER 100 WBC    nRBC 0.00 0.00 - 0.01 K/uL    Neutrophils % 87 (H) 40 - 73 %    Lymphocytes % 5 (L) 21 - 52 %    Monocytes % 7 3 - 10 %    Eosinophils % 0 0 - 5 %    Basophils % 0 0 - 2 %    Immature Granulocytes 1 (H) 0.0 - 0.5 %    Neutrophils Absolute 9.8 (H) 1.8 - 8.0 K/UL    Lymphocytes Absolute 0.5 (L) 0.9 - 3.6 K/UL    Monocytes Absolute 0.8 0.05 - 1.2 K/UL    Eosinophils Absolute 0.0 0.0 - 0.4 K/UL    Basophils Absolute 0.0 0.0 - 0.1 K/UL    Absolute Immature Granulocyte 0.1 (H) 0.00 - 0.04 K/UL    Differential Type AUTOMATED     Comprehensive Metabolic Panel    Collection Time: 06/20/22  6:20 PM   Result Value Ref Range    Sodium 139 136 - 145 mmol/L    Potassium 4.2 3.5 - 5.5 mmol/L    Chloride 107 100 - 111 mmol/L    CO2 29 21 - 32 mmol/L    Anion Gap 3 3.0 - 18 mmol/L    Glucose 200 (H) 74 - 99 mg/dL    BUN 15 7.0 - 18 MG/DL    Creatinine 0.88 0.6 - 1.3 MG/DL    Bun/Cre Ratio 17 12 - 20      Est, Glom Filt Rate >60 >60 ml/min/1.32m    Calcium 9.6  8.5 - 10.1 MG/DL    Total Bilirubin 0.4 0.2 - 1.0 MG/DL    ALT 30 13 - 56 U/L    AST 21 10 - 38 U/L    Alk Phosphatase 137 (H) 45 - 117 U/L    Total Protein 6.5 6.4 - 8.2 g/dL    Albumin 2.5 (L) 3.4 - 5.0 g/dL    Globulin 4.0 2.0 - 4.0 g/dL    Albumin/Globulin Ratio 0.6 (L) 0.8 - 1.7     Procalcitonin    Collection Time: 06/20/22  6:20 PM   Result Value Ref Range    Procalcitonin 0.79 ng/mL   Troponin    Collection Time: 06/20/22  6:20 PM   Result Value Ref Range    Troponin, High Sensitivity 12 0 - 54 ng/L   Lactic Acid    Collection Time: 06/20/22  6:20 PM   Result Value Ref Range    Lactic Acid, Plasma 2.2 (HH) 0.4 - 2.0 MMOL/L   Respiratory Panel, Molecular, with  COVID-19 (Restricted: peds pts or suitable admitted adults)    Collection Time: 06/20/22  6:20 PM    Specimen: Nasopharyngeal   Result Value Ref Range    Adenovirus by PCR Not detected NOTD      Coronavirus 229E by PCR Not detected NOTD      Coronavirus HKU1 by PCR Not detected NOTD      Coronavirus NL63 by PCR Not detected NOTD      Coronavirus OC43 by PCR Not detected NOTD      SARS-CoV-2, PCR Not detected NOTD      Human Metapneumovirus by PCR Not detected NOTD      Rhinovirus Enterovirus PCR Not detected NOTD      Influenza A by PCR Not detected NOTD      Influenza B PCR Not detected NOTD      Parainfluenza 1 PCR Not detected NOTD      Parainfluenza 2 PCR Not detected NOTD      Parainfluenza 3 PCR Not detected NOTD      Parainfluenza 4 PCR Not detected NOTD      Respiratory Syncytial Virus by PCR Not detected NOTD      Bordetella parapertussis by PCR Not detected NOTD      Bordetella pertussis by PCR Not detected NOTD      Chlamydophila Pneumonia PCR Not detected NOTD      Mycoplasma pneumo by PCR Not detected NOTD     Lactic Acid    Collection Time: 06/20/22  8:22 PM   Result Value Ref Range    Lactic Acid, Plasma 0.8 0.4 - 2.0 MMOL/L       Imaging Reviewed:  XR CHEST PORTABLE    Result Date: 06/20/2022  EXAM: Chest Radiograph INDICATION:  Sepsis TECHNIQUE: AP view of the chest COMPARISON: 12/26/2021, 04/22/2018, 08/01/2017 FINDINGS: No pneumothorax identified.  Mild interstitial opacities are noted. No effusions identified.  Cardiac silhouette is prominent.   Mild prominence of the pulmonary vascular tree. Minimal degenerative changes are noted.     1.  Mild pulmonary edema.     CT ABDOMEN PELVIS W IV CONTRAST Additional Contrast? None    Result Date: 06/20/2022  EXAM: CT ABDOMEN AND PELVIS WITH CONTRAST CLINICAL INDICATION/HISTORY: sacral wound with concern for infection, recent fall with pain to buttock. Weakness and fever for 2 days. Weakness. Stage IV sacral ulcer. Increased drainage. Fall today. COMPARISON:  CT abdomen/pelvis 12/28/2021 TECHNIQUE:  CT abdomen and pelvis with IV contrast. All CT  scans at this facility are performed using dose optimization technique as appropriate to the performed examination, to include automated exposure control, adjustment of the mA and/or kV according to patient's size (including appropriate matching for site-specific examinations), or use of an iterative reconstruction technique. FINDINGS: Lower chest: Partially visualized 27m right middle lobe lung nodule is unchanged from April 2022. Liver: Unremarkable. Biliary: Unremarkable. Pancreas: Unremarkable. Spleen: Unremarkable. Adrenal glands: Unremarkable. Kidneys: Unremarkable. Bladder: Unremarkable. Reproductive organs: Calcified uterine fibroid. Stable 2.6 cm right adnexal cyst. Bowel: Moderate stool burden. Normal appendix. No evidence for bowel obstruction. Lymph nodes: No pathologically enlarged lymph nodes. Vasculature: Mild burden of calcified atherosclerotic disease. Other: No free fluid or free air. Body wall: Extensive body wall edema. Ill-defined fluid and gas in the subcutaneous fat overlying the sacrum, just right of midline. Collection overall measures 6.2 x 2.7 x 8.7 cm. Significant surrounding stranding. This appears to extend to the underlying sacrum (series 2, image [id number]). Also extends into the gluteal cleft on the left (2, 64). Bones: There is some erosion of the posterior elements of the midline sacrum underlying the above-described inflammatory process. (series 3, image [id number]). Multilevel degenerative changes in the spine.     1. Large ill-defined fluid and gas collection in the subcutaneous fat overlying the sacrum, just right of midline. This is concerning for phlegmon or early abscess. There is extension to the underlying sacrum, with erosion of the posterior elements of the midline sacrum, consistent with osteomyelitis. -Extensive surrounding stranding and edema. 2. Moderate stool burden, correlate for  constipation. 3. 8 mm right middle lobe lung nodule is unchanged since at least April 2022. Recommend follow-up in April 2024 to ensure two-year stability. 4. Stable small right adnexal cyst.    CT HEAD WO CONTRAST    Result Date: 06/20/2022  CT of the head without contrast HISTORY: Weakness and fever for 2 days. Weakness. Stage IV sacral ulcer. Increased drainage. Fall today. COMPARISON: CT head 12/27/2021, MR brain 12/31/2018. TECHNIQUE: Axial images of the brain were obtained, without the administration of intravenous contrast. Coronal and sagittal reconstructions were generated. All CT scans at this facility are performed using dose optimization technique as appropriate to a performed exam, to include automated exposure control, adjustment of the MA and/or kV according to patient size (including appropriate matching for site-specific examinations) or use of  iterative reconstruction technique. FINDINGS: Paranasal sinuses and mastoid air cells: Paranasal sinuses are clear. Mastoid air cells are clear. Calvarium: No acute fracture. Brain: Chronic left cerebellar infarct. Previously seen left thalamic infarct not is well visualized. Chronic infarct in the pons. No acute intracranial hemorrhage. No mass effect or midline shift. Gray-white matter differentiations are maintained.     No acute intracranial abnormality.  Chronic pontine and left cerebellar infarcts. Previously seen left thalamic lacunar infarct not well visualized.            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 ([place] 06/20/2022 Yes     Assessment:  Severe Sepsis 2/2 sacral abscess  with osteomyelitis - tachycardia,  febrile, lactic acid 2.2--0.8, procal 0.79  DMT2  Hypertension  History of CVA  Hyperlipidemia  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 after midnight at this time.  States they will see the patient tomorrow   -ID consulted    PT/OT/IS    Anticipated Discharge: 2 days     DVT Prophylaxis:  _0 Lovenox  _1 Hep SQ  _2 SCDs  _3 Coumadin _4 DOAC  _5 On Heparin gtt     I have personally reviewed all pertinent labs, films and EKGs that have officially resulted. I reviewed available electronic documentation outlining the initial presentation as well as the emergency room physician's encounter.    Time spent reviewing records, independently interpreting results, obtaining history from patient or caregiver, performing physical exam, ordering tests and medications, communicating with specialists, documenting in the chart, and coordinating overall care is  >75 minutes     Durward Mallard, FNP-C  [place]  Office:  [phone]

## 2022-06-20 NOTE — ED Triage Notes (Signed)
Pt arrived via EMS from home c/o feeling weak and febrile x 2 days, per report pt having a hard time walking due to weakness, pt also has a chronic wound on sacral area.

## 2022-06-21 LAB — CULTURE, BLOOD 1

## 2022-06-21 LAB — CULTURE, BLOOD, PCR ID PANEL RESULTS REPORT
Acinetobacter calcoac baumannii complex by PCR: NOT DETECTED
Bacteroides fragilis by PCR: DETECTED — AB
Candida albicans by PCR: NOT DETECTED
Candida auris by PCR: NOT DETECTED
Candida glabrata: NOT DETECTED
Candida krusei by PCR: NOT DETECTED
Candida parapsilosis by PCR: NOT DETECTED
Candida tropicalis by PCR: NOT DETECTED
Cryptococcus neoformans/gattii by PCR: NOT DETECTED
Enterobacter cloacae complex by PCR: NOT DETECTED
Enterobacteriaceae by PCR: NOT DETECTED
Enterococcus faecalis by PCR: NOT DETECTED
Enterococcus faecium by PCR: NOT DETECTED
Escherichia Coli: NOT DETECTED
Haemophilus Influenzae by PCR: NOT DETECTED
Klebsiella aerogenes by PCR: NOT DETECTED
Klebsiella oxytoca by PCR: NOT DETECTED
Klebsiella pneumoniae group by PCR: NOT DETECTED
Listeria monocytogenes by PCR: NOT DETECTED
Neisseria meningitidis by PCR: NOT DETECTED
Proteus by PCR: NOT DETECTED
Pseudomonas aeruginosa: NOT DETECTED
STAPHYLOCOCCUS: NOT DETECTED
STREPTOCOCCUS: NOT DETECTED
Salmonella species by PCR: NOT DETECTED
Serratia marcescens by PCR: NOT DETECTED
Staphylococcus Aureus: NOT DETECTED
Staphylococcus epidermidis by PCR: NOT DETECTED
Staphylococcus lugdunensis by PCR: NOT DETECTED
Stenotrophomonas maltophilia by PCR: NOT DETECTED
Strep pneumoniae: NOT DETECTED
Strep pyogenes,(Grp. A): NOT DETECTED
Streptococcus agalactiae (Group B): NOT DETECTED

## 2022-06-21 LAB — RESPIRATORY PANEL, MOLECULAR, WITH COVID-19
Adenovirus by PCR: NOT DETECTED
Bordetella parapertussis by PCR: NOT DETECTED
Bordetella pertussis by PCR: NOT DETECTED
Chlamydophila Pneumonia PCR: NOT DETECTED
Coronavirus 229E by PCR: NOT DETECTED
Coronavirus HKU1 by PCR: NOT DETECTED
Coronavirus NL63 by PCR: NOT DETECTED
Coronavirus OC43 by PCR: NOT DETECTED
Human Metapneumovirus by PCR: NOT DETECTED
Influenza A by PCR: NOT DETECTED
Influenza B PCR: NOT DETECTED
Mycoplasma pneumo by PCR: NOT DETECTED
Parainfluenza 1 PCR: NOT DETECTED
Parainfluenza 2 PCR: NOT DETECTED
Parainfluenza 3 PCR: NOT DETECTED
Parainfluenza 4 PCR: NOT DETECTED
Respiratory Syncytial Virus by PCR: NOT DETECTED
Rhinovirus Enterovirus PCR: NOT DETECTED
SARS-CoV-2, PCR: NOT DETECTED

## 2022-06-21 LAB — CBC WITH AUTO DIFFERENTIAL
Basophils %: 0 % (ref 0–2)
Basophils Absolute: 0 10*3/uL (ref 0.0–0.1)
Eosinophils %: 1 % (ref 0–5)
Eosinophils Absolute: 0.1 10*3/uL (ref 0.0–0.4)
Hematocrit: 27.7 % — ABNORMAL LOW (ref 35.0–45.0)
Hemoglobin: 8.3 g/dL — ABNORMAL LOW (ref 12.0–16.0)
Immature Granulocytes %: 1 % — ABNORMAL HIGH (ref 0.0–0.5)
Immature Granulocytes Absolute: 0.1 10*3/uL — ABNORMAL HIGH (ref 0.00–0.04)
Lymphocytes %: 5 % — ABNORMAL LOW (ref 21–52)
Lymphocytes Absolute: 0.5 10*3/uL — ABNORMAL LOW (ref 0.9–3.6)
MCH: 22.1 pg — ABNORMAL LOW (ref 24.0–34.0)
MCHC: 30 g/dL — ABNORMAL LOW (ref 31.0–37.0)
MCV: 73.9 FL — ABNORMAL LOW (ref 78.0–100.0)
MPV: 8.7 FL — ABNORMAL LOW (ref 9.2–11.8)
Monocytes %: 13 % — ABNORMAL HIGH (ref 3–10)
Monocytes Absolute: 1.5 10*3/uL — ABNORMAL HIGH (ref 0.05–1.2)
Neutrophils %: 81 % — ABNORMAL HIGH (ref 40–73)
Neutrophils Absolute: 9.4 10*3/uL — ABNORMAL HIGH (ref 1.8–8.0)
Nucleated RBCs: 0 /100{WBCs}
Platelets: 302 10*3/uL (ref 135–420)
RBC: 3.75 M/uL — ABNORMAL LOW (ref 4.20–5.30)
RDW: 18.5 % — ABNORMAL HIGH (ref 11.6–14.5)
WBC: 11.6 10*3/uL (ref 4.6–13.2)
nRBC: 0 10*3/uL (ref 0.00–0.01)

## 2022-06-21 LAB — COMPREHENSIVE METABOLIC PANEL
ALT: 30 U/L (ref 13–56)
AST: 21 U/L (ref 10–38)
Albumin/Globulin Ratio: 0.6 — ABNORMAL LOW (ref 0.8–1.7)
Albumin: 2.5 g/dL — ABNORMAL LOW (ref 3.4–5.0)
Alk Phosphatase: 137 U/L — ABNORMAL HIGH (ref 45–117)
Anion Gap: 3 mmol/L (ref 3.0–18)
BUN/Creatinine Ratio: 17 (ref 12–20)
BUN: 15 mg/dL (ref 7.0–18)
CO2: 29 mmol/L (ref 21–32)
Calcium: 9.6 mg/dL (ref 8.5–10.1)
Chloride: 107 mmol/L (ref 100–111)
Creatinine: 0.88 mg/dL (ref 0.6–1.3)
Est, Glom Filt Rate: >60 mL/min/{1.73_m2} (ref >60)
Globulin: 4 g/dL (ref 2.0–4.0)
Glucose: 200 mg/dL — ABNORMAL HIGH (ref 74–99)
Potassium: 4.2 mmol/L (ref 3.5–5.5)
Sodium: 139 mmol/L (ref 136–145)
Total Bilirubin: 0.4 mg/dL (ref 0.2–1.0)
Total Protein: 6.5 g/dL (ref 6.4–8.2)

## 2022-06-21 LAB — POCT GLUCOSE
POC Glucose: 228 mg/dL — ABNORMAL HIGH (ref 70–110)
POC Glucose: 238 mg/dL — ABNORMAL HIGH (ref 70–110)
POC Glucose: 227 mg/dL — ABNORMAL HIGH (ref 70–110)
POC Glucose: 258 mg/dL — ABNORMAL HIGH (ref 70–110)
POC Glucose: 256 mg/dL — ABNORMAL HIGH (ref 70–110)
POC Glucose: 224 mg/dL — ABNORMAL HIGH (ref 70–110)

## 2022-06-21 LAB — EKG 12-LEAD
Atrial Rate: 112 {beats}/min
P Axis: 65 degrees
P-R Interval: 130 ms
Q-T Interval: 312 ms
QRS Duration: 74 ms
QTc Calculation (Bazett): 425 ms
R Axis: 26 degrees
T Axis: 61 degrees
Ventricular Rate: 112 {beats}/min

## 2022-06-21 LAB — TROPONIN: Troponin, High Sensitivity: 12 ng/L (ref 0–54)

## 2022-06-21 LAB — URINALYSIS
Bilirubin Urine: NEGATIVE
Glucose, UA: NEGATIVE mg/dL
Ketones, Urine: NEGATIVE mg/dL
Leukocyte Esterase, Urine: NEGATIVE
Nitrite, Urine: NEGATIVE
Protein, UA: 30 mg/dL — AB
Specific Gravity, UA: >1.03 — ABNORMAL HIGH (ref 1.003–1.030)
Urobilinogen, Urine: 1 EU/dL (ref 0.2–1.0)
pH, Urine: 6 (ref 5.0–8.0)

## 2022-06-21 LAB — BASIC METABOLIC PANEL W/ REFLEX TO MG FOR LOW K
Anion Gap: 5 mmol/L (ref 3.0–18)
BUN/Creatinine Ratio: 15 (ref 12–20)
BUN: 14 mg/dL (ref 7.0–18)
CO2: 26 mmol/L (ref 21–32)
Calcium: 9.1 mg/dL (ref 8.5–10.1)
Chloride: 109 mmol/L (ref 100–111)
Creatinine: 0.94 mg/dL (ref 0.6–1.3)
Est, Glom Filt Rate: >60 mL/min/{1.73_m2} (ref >60)
Glucose: 252 mg/dL — ABNORMAL HIGH (ref 74–99)
Potassium: 3.3 mmol/L — ABNORMAL LOW (ref 3.5–5.5)
Sodium: 140 mmol/L (ref 136–145)

## 2022-06-21 LAB — URINALYSIS, MICRO
RBC, UA: NEGATIVE /HPF (ref 0–5)
WBC, UA: NEGATIVE /HPF (ref 0–4)

## 2022-06-21 LAB — MAGNESIUM: Magnesium: 1.3 mg/dL — ABNORMAL LOW (ref 1.6–2.6)

## 2022-06-21 LAB — PROCALCITONIN: Procalcitonin: 0.79 ng/mL

## 2022-06-21 LAB — LACTIC ACID
Lactic Acid, Plasma: 2.2 MMOL/L (ref 0.4–2.0)
Lactic Acid, Plasma: 0.8 MMOL/L (ref 0.4–2.0)

## 2022-06-21 LAB — HEMOGLOBIN A1C
Estimated Avg Glucose: 189 mg/dL
Hemoglobin A1C: 8.2 % — ABNORMAL HIGH (ref 4.2–5.6)

## 2022-06-21 MED ORDER — GLUCAGON (RDNA) 1 MG IJ KIT
1 MG | INTRAMUSCULAR | Status: DC | PRN
Start: 2022-06-21 — End: 2022-07-04

## 2022-06-21 MED ORDER — POTASSIUM BICARB-CITRIC ACID 20 MEQ PO TBEF
20 MEQ | ORAL | Status: DC | PRN
Start: 2022-06-21 — End: 2022-07-04
  Administered 2022-06-21: 20:00:00 40 meq via ORAL

## 2022-06-21 MED ORDER — CULTURELLE PO CAPS
Freq: Every day | ORAL | Status: AC
Start: 2022-06-21 — End: 2022-07-04
  Administered 2022-06-21 – 2022-07-04 (×13): 1 via ORAL

## 2022-06-21 MED ORDER — ATORVASTATIN CALCIUM 40 MG PO TABS
40 MG | Freq: Every evening | ORAL | Status: DC
Start: 2022-06-21 — End: 2022-07-04
  Administered 2022-06-21 – 2022-07-04 (×14): 80 mg via ORAL

## 2022-06-21 MED ORDER — MAGNESIUM SULFATE 2000 MG/50 ML IVPB PREMIX
2 GM/50ML | INTRAVENOUS | Status: DC | PRN
Start: 2022-06-21 — End: 2022-07-04
  Administered 2022-06-21 (×2): 2000 mg via INTRAVENOUS

## 2022-06-21 MED ORDER — VIAL-MATE ADAPTOR
0.9 % | INTRAVENOUS | Status: DC
Start: 2022-06-21 — End: 2022-07-01
  Administered 2022-06-22 – 2022-07-01 (×13): 1000 mg via INTRAVENOUS

## 2022-06-21 MED ORDER — SODIUM CHLORIDE 0.9 % IV SOLN
0.9 % | INTRAVENOUS | Status: DC
Start: 2022-06-21 — End: 2022-06-20

## 2022-06-21 MED ORDER — NORMAL SALINE FLUSH 0.9 % IV SOLN
0.9 % | INTRAVENOUS | Status: DC | PRN
Start: 2022-06-21 — End: 2022-07-04

## 2022-06-21 MED ORDER — POTASSIUM CHLORIDE CRYS ER 20 MEQ PO TBCR
20 MEQ | ORAL | Status: DC | PRN
Start: 2022-06-21 — End: 2022-07-04

## 2022-06-21 MED ORDER — GLUCOSE 4 G PO CHEW
4 g | ORAL | Status: DC | PRN
Start: 2022-06-21 — End: 2022-07-04

## 2022-06-21 MED ORDER — BACLOFEN 10 MG PO TABS
10 MG | Freq: Two times a day (BID) | ORAL | Status: DC
Start: 2022-06-21 — End: 2022-07-04
  Administered 2022-06-21 – 2022-07-04 (×27): 10 mg via ORAL

## 2022-06-21 MED ORDER — POTASSIUM CHLORIDE 10 MEQ/100ML IV SOLN
10 MEQ/0ML | INTRAVENOUS | Status: DC | PRN
Start: 2022-06-21 — End: 2022-07-04

## 2022-06-21 MED ORDER — PIPERACILLIN SOD-TAZOBACTAM SO 3.375 (3-0.375) G IV SOLR
3.375 (3-0.375) g | Freq: Three times a day (TID) | INTRAVENOUS | Status: DC
Start: 2022-06-21 — End: 2022-07-04
  Administered 2022-06-21 – 2022-07-04 (×40): 3375 mg via INTRAVENOUS

## 2022-06-21 MED ORDER — SODIUM CHLORIDE 0.9 % IV SOLN
0.9 % | INTRAVENOUS | Status: AC
Start: 2022-06-21 — End: 2022-06-21
  Administered 2022-06-21: 06:00:00 via INTRAVENOUS

## 2022-06-21 MED ORDER — POLYETHYLENE GLYCOL 3350 17 G PO PACK
17 g | Freq: Every day | ORAL | Status: AC | PRN
Start: 2022-06-21 — End: 2022-07-04

## 2022-06-21 MED ORDER — FAMOTIDINE 20 MG PO TABS
20 MG | Freq: Every day | ORAL | Status: DC
Start: 2022-06-21 — End: 2022-07-04
  Administered 2022-06-21 – 2022-07-04 (×13): 20 mg via ORAL

## 2022-06-21 MED ORDER — DEXTROSE 10 % IV BOLUS
INTRAVENOUS | Status: DC | PRN
Start: 2022-06-21 — End: 2022-07-04

## 2022-06-21 MED ORDER — ASPIRIN 325 MG PO TABS
325 MG | Freq: Every day | ORAL | Status: AC
Start: 2022-06-21 — End: 2022-07-04
  Administered 2022-06-21 – 2022-07-04 (×10): 325 mg via ORAL

## 2022-06-21 MED ORDER — FERROUS SULFATE 325 (65 FE) MG PO TABS
325 (65 Fe) MG | Freq: Every day | ORAL | Status: DC
Start: 2022-06-21 — End: 2022-07-04
  Administered 2022-06-21 – 2022-07-04 (×13): 325 mg via ORAL

## 2022-06-21 MED ORDER — ONDANSETRON 4 MG PO TBDP
4 MG | Freq: Three times a day (TID) | ORAL | Status: DC | PRN
Start: 2022-06-21 — End: 2022-07-04

## 2022-06-21 MED ORDER — SODIUM CHLORIDE 0.9 % IV SOLN
0.9 % | Freq: Two times a day (BID) | INTRAVENOUS | Status: DC
Start: 2022-06-21 — End: 2022-06-21
  Administered 2022-06-21: 14:00:00 750 mg via INTRAVENOUS

## 2022-06-21 MED ORDER — DEXTROSE 10 % IV SOLN
10 % | INTRAVENOUS | Status: DC | PRN
Start: 2022-06-21 — End: 2022-07-04

## 2022-06-21 MED ORDER — ONDANSETRON HCL 4 MG/2ML IJ SOLN
4 MG/2ML | Freq: Four times a day (QID) | INTRAMUSCULAR | Status: DC | PRN
Start: 2022-06-21 — End: 2022-07-04
  Administered 2022-06-26: 22:00:00 4 mg via INTRAVENOUS

## 2022-06-21 MED ORDER — NORMAL SALINE FLUSH 0.9 % IV SOLN
0.9 % | Freq: Two times a day (BID) | INTRAVENOUS | Status: DC
Start: 2022-06-21 — End: 2022-07-04
  Administered 2022-06-21 – 2022-07-04 (×21): 10 mL via INTRAVENOUS

## 2022-06-21 MED ORDER — INSULIN GLARGINE 100 UNIT/ML SC SOLN
100 UNIT/ML | Freq: Every evening | SUBCUTANEOUS | Status: DC
Start: 2022-06-21 — End: 2022-06-26
  Administered 2022-06-22 – 2022-06-27 (×6): 10 [IU] via SUBCUTANEOUS

## 2022-06-21 MED ORDER — INSULIN LISPRO 100 UNIT/ML IJ SOLN
100 UNIT/ML | Freq: Four times a day (QID) | INTRAMUSCULAR | Status: AC
Start: 2022-06-21 — End: 2022-06-25
  Administered 2022-06-21: 20:00:00 6 [IU] via SUBCUTANEOUS
  Administered 2022-06-21 (×2): 4 [IU] via SUBCUTANEOUS
  Administered 2022-06-22: 19:00:00 10 [IU] via SUBCUTANEOUS
  Administered 2022-06-22: 8 [IU] via SUBCUTANEOUS
  Administered 2022-06-22: 03:00:00 6 [IU] via SUBCUTANEOUS
  Administered 2022-06-23: 22:00:00 8 [IU] via SUBCUTANEOUS
  Administered 2022-06-23 (×3): 6 [IU] via SUBCUTANEOUS
  Administered 2022-06-24: 14:00:00 15 [IU] via SUBCUTANEOUS
  Administered 2022-06-24: 02:00:00 10 [IU] via SUBCUTANEOUS
  Administered 2022-06-24: 20:00:00 6 [IU] via SUBCUTANEOUS
  Administered 2022-06-24: 18:00:00 9 [IU] via SUBCUTANEOUS
  Administered 2022-06-25: 14:00:00 12 [IU] via SUBCUTANEOUS
  Administered 2022-06-25: 03:00:00 6 [IU] via SUBCUTANEOUS

## 2022-06-21 MED ORDER — HEPARIN SODIUM (PORCINE) 5000 UNIT/ML IJ SOLN
5000 UNIT/ML | Freq: Three times a day (TID) | INTRAMUSCULAR | Status: AC
Start: 2022-06-21 — End: 2022-07-04
  Administered 2022-06-21 – 2022-07-04 (×29): 5000 [IU] via SUBCUTANEOUS

## 2022-06-21 MED ORDER — SODIUM CHLORIDE 0.9 % IV BOLUS
0.9 % | Freq: Once | INTRAVENOUS | Status: AC
Start: 2022-06-21 — End: 2022-06-21
  Administered 2022-06-21: 05:00:00 1000 mL via INTRAVENOUS

## 2022-06-21 MED ORDER — IOPAMIDOL 61 % IV SOLN
61 % | Freq: Once | INTRAVENOUS | Status: AC | PRN
Start: 2022-06-21 — End: 2022-06-20
  Administered 2022-06-21: 01:00:00 100 mL via INTRAVENOUS

## 2022-06-21 MED ORDER — ACETAMINOPHEN 650 MG RE SUPP
650 | Freq: Four times a day (QID) | RECTAL | Status: DC | PRN
Start: 2022-06-21 — End: 2022-07-04

## 2022-06-21 MED ORDER — ACETAMINOPHEN 325 MG PO TABS
325 | Freq: Four times a day (QID) | ORAL | Status: DC | PRN
Start: 2022-06-21 — End: 2022-07-04
  Administered 2022-06-21 – 2022-07-03 (×7): 650 mg via ORAL

## 2022-06-21 MED FILL — INSULIN LISPRO 100 UNIT/ML IJ SOLN: 100 UNIT/ML | INTRAMUSCULAR | Qty: 4

## 2022-06-21 MED FILL — MAGNESIUM SULFATE 2 GM/50ML IV SOLN: 2 GM/50ML | INTRAVENOUS | Qty: 50

## 2022-06-21 MED FILL — FAMOTIDINE 20 MG PO TABS: 20 MG | ORAL | Qty: 1

## 2022-06-21 MED FILL — VANCOMYCIN HCL 750 MG IV SOLR: 750 MG | INTRAVENOUS | Qty: 750

## 2022-06-21 MED FILL — ACETAMINOPHEN 325 MG PO TABS: 325 MG | ORAL | Qty: 2

## 2022-06-21 MED FILL — CULTURELLE PO CAPS: ORAL | Qty: 1

## 2022-06-21 MED FILL — HEPARIN SODIUM (PORCINE) 5000 UNIT/ML IJ SOLN: 5000 UNIT/ML | INTRAMUSCULAR | Qty: 1

## 2022-06-21 MED FILL — PIPERACILLIN SOD-TAZOBACTAM SO 3.375 (3-0.375) G IV SOLR: 3.375 (3-0.375) g | INTRAVENOUS | Qty: 3375

## 2022-06-21 MED FILL — FERROUS SULFATE 325 (65 FE) MG PO TABS: 325 (65 Fe) MG | ORAL | Qty: 1

## 2022-06-21 MED FILL — BACLOFEN 10 MG PO TABS: 10 MG | ORAL | Qty: 1

## 2022-06-21 MED FILL — ASPIRIN ADULT 325 MG PO TABS: 325 MG | ORAL | Qty: 1

## 2022-06-21 MED FILL — INSULIN LISPRO 100 UNIT/ML IJ SOLN: 100 UNIT/ML | INTRAMUSCULAR | Qty: 6

## 2022-06-21 MED FILL — VANCOMYCIN HCL 1 G IV SOLR: 1 g | INTRAVENOUS | Qty: 0

## 2022-06-21 MED FILL — EFFER-K 20 MEQ PO TBEF: 20 MEQ | ORAL | Qty: 2

## 2022-06-21 MED FILL — ISOVUE-300 61 % IV SOLN: 61 % | INTRAVENOUS | Qty: 100

## 2022-06-21 MED FILL — BD POSIFLUSH 0.9 % IV SOLN: 0.9 % | INTRAVENOUS | Qty: 40

## 2022-06-21 MED FILL — SODIUM CHLORIDE 0.9 % IV SOLN: 0.9 % | INTRAVENOUS | Qty: 1000

## 2022-06-21 MED FILL — ATORVASTATIN CALCIUM 40 MG PO TABS: 40 MG | ORAL | Qty: 2

## 2022-06-21 NOTE — Progress Notes (Signed)
General Surgery Consult    Elliett Guarisco  Admit date: 06/20/2022    MRN: [id number]     DOB: 1956-07-19     Age: 66 y.o.        Attending Physician: Earlyne Iba, MD, FACS      History of Present Illness:      [name] Short is a 66 y.o. female who I was consulted by the emergency room for evaluation of a decubitus ulcer.  The patient had this ulcer since earlier this year when she underwent spine surgery and she was not able to walk and she developed a decubitus ulcer.  She was seen by our team and she had multiple debridement as well and her last office visit with [place] was 2 months ago and her wound was healing well.   According to the daughter the patient was not able to walk very well and she fell couple times and now there is worsening of her ulceration.  She had a CT scan that showed a phlegmon with possible collection with evidence of osteomyelitis at the sacrum.    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    Acute CVA (cerebrovascular accident) ([place]) 01/06/2022    Lacunar infarct, acute ([place]) 12/31/2021    Skin ulcer of sacrum ([place]) 12/28/2021    RUQ pain 12/28/2021    Decubitus ulcer of sacral region, stage 4 ([place]) 12/28/2021    Failure to thrive in adult 12/27/2021    Iron deficiency anemia due to chronic blood loss 12/21/2016    Occult blood in stools 12/21/2016    Symptomatic anemia 12/20/2016    Stroke ([place]) 04/23/2013    Diabetes ([place])      Past Medical History:   Diagnosis Date    Anemia     Arthritis     Chronic pain     Legs and Shoulders    Diabetes ([place])     Exposure to asbestos      History of blood transfusion     History of colon polyps     HTN (hypertension)     Hx of blood clots     DVT    Hyperlipemia     Menopause     Pulmonary nodule     Serum calcium elevated     Sleep apnea     not using cpap    Stroke [facility])       Past Surgical History:   Procedure Laterality Date    CESAREAN SECTION  1987    COLONOSCOPY N/A 01/13/2017    COLONOSCOPY performed by Joycelyn Schmid, MD at [place] N/A 12/28/2021    SACRAL WOUND DEBRIDEMENT INCISION AND DRAINAGE performed by Nestor Ramp, DO at [place] N/A 12/09/2021    CERVICAL THREE/FOUR/FIVE/SIX LAMINECTOMY FUSION; C-ARM; STRYKER; EXT BONE GROWTH STIM; 23 HR performed by Candi Leash, MD at [place]  Social History     Tobacco Use    Smoking status: Former     Packs/day: 1     Types: Cigarettes, Cigars     Quit date: 11/13/2021     Years since quitting: 0.6    Smokeless tobacco: Never   Substance Use Topics    Alcohol use: Not Currently     Comment: occ      Social History     Tobacco Use   Smoking Status Former    Packs/day: 1    Types: Cigarettes, Cigars    Quit date: 11/13/2021    Years since quitting: 0.6   Smokeless Tobacco Never     Family History   Problem Relation Age of Onset    Diabetes Other 71        parent, NOS    Cancer Father     Hypertension Other 35        parent,NOS    Heart Disease Other 21        parent, NOS    Lung Disease Father       Current Facility-Administered Medications   Medication Dose Route Frequency    lactobacillus (CULTURELLE) capsule 1 capsule  1 capsule Oral Daily with breakfast    vancomycin (VANCOCIN) 750 mg in sodium chloride 0.9 % 250 mL IVPB (vial-mate)  750 mg IntraVENous Q12H    sodium chloride flush 0.9 % injection 5-40 mL  5-40 mL IntraVENous 2 times per day    sodium chloride flush 0.9 % injection 5-40 mL  5-40 mL IntraVENous PRN    potassium chloride (KLOR-CON M) extended release tablet 40 mEq  40  mEq Oral PRN    Or    potassium bicarb-citric acid (EFFER-K) effervescent tablet 40 mEq  40 mEq Oral PRN    Or    potassium chloride 10 mEq/100 mL IVPB (Peripheral Line)  10 mEq IntraVENous PRN    magnesium sulfate 2000 mg in 50 mL IVPB premix  2,000 mg IntraVENous PRN    ondansetron (ZOFRAN-ODT) disintegrating tablet 4 mg  4 mg Oral Q8H PRN    Or    ondansetron (ZOFRAN) injection 4 mg  4 mg IntraVENous Q6H PRN    polyethylene glycol (GLYCOLAX) packet 17 g  17 g Oral Daily PRN    famotidine (PEPCID) tablet 20 mg  20 mg Oral Daily    acetaminophen (TYLENOL) tablet 650 mg  650 mg Oral Q6H PRN    Or    acetaminophen (TYLENOL) suppository 650 mg  650 mg Rectal Q6H PRN    insulin lispro (HUMALOG) injection vial 0-8 Units  0-8 Units SubCUTAneous 4x Daily AC & HS    glucose chewable tablet 16 g  4 tablet Oral PRN    dextrose bolus 10% 125 mL  125 mL IntraVENous PRN    Or    dextrose bolus 10% 250 mL  250 mL IntraVENous PRN    glucagon injection 1 mg  1 mg SubCUTAneous PRN    dextrose 10 % infusion   IntraVENous Continuous PRN    piperacillin-tazobactam (ZOSYN) 3,375 mg in sodium chloride 0.9 % 50 mL IVPB (mini-bag)  3,375 mg IntraVENous Q8H    heparin (porcine) injection 5,000 Units  5,000 Units SubCUTAneous 3 times per day    0.9 % sodium chloride infusion   IntraVENous Continuous    insulin glargine (LANTUS) injection vial 10 Units  10 Units SubCUTAneous Nightly    baclofen (LIORESAL) tablet 10 mg  10 mg Oral BID  aspirin tablet 325 mg  325 mg Oral Daily    atorvastatin (LIPITOR) tablet 80 mg  80 mg Oral Nightly    ferrous sulfate (IRON 325) tablet 325 mg  325 mg Oral Daily      Allergies   Allergen Reactions    Lactose Other (See Comments)     Intolerance          Review of Systems:  Pertinent items are noted in the History of Present Illness.    Objective:     BP (!) 151/70   Pulse 99   Temp (!) 100.7 F (38.2 C) (Axillary)   Resp 16   Ht 1.499 m (_0 )   Wt 77.5 kg (170 lb 13.7 oz)   SpO2 96%   BMI 34.51  kg/m     Physical Exam:      General:  in no apparent distress                   Abdomen:   rounded, soft, nontender, nondistended.  I did not look at the ulcer itself but I looked at the pictures that were taken the same day by the daughter which showed an ulcer with good granulation and some erythema.                Imaging and Lab Review:     CBC:   Lab Results   Component Value Date/Time    WBC 11.6 06/21/2022 03:38 AM    RBC 3.75 06/21/2022 03:38 AM    HGB 8.3 06/21/2022 03:38 AM    HCT 27.7 06/21/2022 03:38 AM    PLT 302 06/21/2022 03:38 AM     BMP:   Lab Results   Component Value Date/Time    NA 140 06/21/2022 03:38 AM    K 3.3 06/21/2022 03:38 AM    CL 109 06/21/2022 03:38 AM    CO2 26 06/21/2022 03:38 AM    BUN 14 06/21/2022 03:38 AM     CMP:  Lab Results   Component Value Date/Time    NA 140 06/21/2022 03:38 AM    K 3.3 06/21/2022 03:38 AM    CL 109 06/21/2022 03:38 AM    CO2 26 06/21/2022 03:38 AM    BUN 14 06/21/2022 03:38 AM    TP 6.0 02/02/2022 04:00 PM    GLOB 4.0 06/20/2022 06:20 PM       Recent Results (from the past 24 hour(s))   EKG 12 Lead    Collection Time: 06/20/22  5:58 PM   Result Value Ref Range    Ventricular Rate 112 BPM    Atrial Rate 112 BPM    P-R Interval 130 ms    QRS Duration 74 ms    Q-T Interval 312 ms    QTc Calculation (Bazett) 425 ms    P Axis 65 degrees    R Axis 26 degrees    T Axis 61 degrees    Diagnosis       Sinus tachycardia  Otherwise normal ECG  When compared with ECG of 26-Dec-2021 20:27,  No significant change was found     CBC with Auto Differential    Collection Time: 06/20/22  6:20 PM   Result Value Ref Range    WBC 11.3 4.6 - 13.2 K/uL    RBC 3.79 (L) 4.20 - 5.30 M/uL    Hemoglobin 8.4 (L) 12.0 - 16.0 g/dL    Hematocrit 27.8 (L) 35.0 - 45.0 %  MCV 73.4 (L) 78.0 - 100.0 FL    MCH 22.2 (L) 24.0 - 34.0 PG    MCHC 30.2 (L) 31.0 - 37.0 g/dL    RDW 18.4 (H) 11.6 - 14.5 %    Platelets 354 135 - 420 K/uL    MPV 9.1 (L) 9.2 - 11.8 FL    Nucleated RBCs 0.0 0 PER 100 WBC     nRBC 0.00 0.00 - 0.01 K/uL    Neutrophils % 87 (H) 40 - 73 %    Lymphocytes % 5 (L) 21 - 52 %    Monocytes % 7 3 - 10 %    Eosinophils % 0 0 - 5 %    Basophils % 0 0 - 2 %    Immature Granulocytes 1 (H) 0.0 - 0.5 %    Neutrophils Absolute 9.8 (H) 1.8 - 8.0 K/UL    Lymphocytes Absolute 0.5 (L) 0.9 - 3.6 K/UL    Monocytes Absolute 0.8 0.05 - 1.2 K/UL    Eosinophils Absolute 0.0 0.0 - 0.4 K/UL    Basophils Absolute 0.0 0.0 - 0.1 K/UL    Absolute Immature Granulocyte 0.1 (H) 0.00 - 0.04 K/UL    Differential Type AUTOMATED     Comprehensive Metabolic Panel    Collection Time: 06/20/22  6:20 PM   Result Value Ref Range    Sodium 139 136 - 145 mmol/L    Potassium 4.2 3.5 - 5.5 mmol/L    Chloride 107 100 - 111 mmol/L    CO2 29 21 - 32 mmol/L    Anion Gap 3 3.0 - 18 mmol/L    Glucose 200 (H) 74 - 99 mg/dL    BUN 15 7.0 - 18 MG/DL    Creatinine 0.88 0.6 - 1.3 MG/DL    Bun/Cre Ratio 17 12 - 20      Est, Glom Filt Rate >60 >60 ml/min/1.20m    Calcium 9.6 8.5 - 10.1 MG/DL    Total Bilirubin 0.4 0.2 - 1.0 MG/DL    ALT 30 13 - 56 U/L    AST 21 10 - 38 U/L    Alk Phosphatase 137 (H) 45 - 117 U/L    Total Protein 6.5 6.4 - 8.2 g/dL    Albumin 2.5 (L) 3.4 - 5.0 g/dL    Globulin 4.0 2.0 - 4.0 g/dL    Albumin/Globulin Ratio 0.6 (L) 0.8 - 1.7     Procalcitonin    Collection Time: 06/20/22  6:20 PM   Result Value Ref Range    Procalcitonin 0.79 ng/mL   Troponin    Collection Time: 06/20/22  6:20 PM   Result Value Ref Range    Troponin, High Sensitivity 12 0 - 54 ng/L   Lactic Acid    Collection Time: 06/20/22  6:20 PM   Result Value Ref Range    Lactic Acid, Plasma 2.2 (HH) 0.4 - 2.0 MMOL/L   Respiratory Panel, Molecular, with COVID-19 (Restricted: peds pts or suitable admitted adults)    Collection Time: 06/20/22  6:20 PM    Specimen: Nasopharyngeal   Result Value Ref Range    Adenovirus by PCR Not detected NOTD      Coronavirus 229E by PCR Not detected NOTD      Coronavirus HKU1 by PCR Not detected NOTD      Coronavirus NL63 by PCR  Not detected NOTD      Coronavirus OC43 by PCR Not detected NOTD      SARS-CoV-2, PCR Not detected  NOTD      Human Metapneumovirus by PCR Not detected NOTD      Rhinovirus Enterovirus PCR Not detected NOTD      Influenza A by PCR Not detected NOTD      Influenza B PCR Not detected NOTD      Parainfluenza 1 PCR Not detected NOTD      Parainfluenza 2 PCR Not detected NOTD      Parainfluenza 3 PCR Not detected NOTD      Parainfluenza 4 PCR Not detected NOTD      Respiratory Syncytial Virus by PCR Not detected NOTD      Bordetella parapertussis by PCR Not detected NOTD      Bordetella pertussis by PCR Not detected NOTD      Chlamydophila Pneumonia PCR Not detected NOTD      Mycoplasma pneumo by PCR Not detected NOTD     Hemoglobin A1c    Collection Time: 06/20/22  6:20 PM   Result Value Ref Range    Hemoglobin A1C 8.2 (H) 4.2 - 5.6 %    eAG 189 mg/dL   Lactic Acid    Collection Time: 06/20/22  8:22 PM   Result Value Ref Range    Lactic Acid, Plasma 0.8 0.4 - 2.0 MMOL/L   Urinalysis    Collection Time: 06/20/22 11:42 PM   Result Value Ref Range    Color, UA YELLOW      Appearance CLEAR      Specific Gravity, UA >1.030 (H) 1.003 - 1.030    pH, Urine 6.0 5.0 - 8.0      Protein, UA 30 (A) NEG mg/dL    Glucose, UA Negative NEG mg/dL    Ketones, Urine Negative NEG mg/dL    Bilirubin Urine Negative NEG      Blood, Urine MODERATE (A) NEG      Urobilinogen, Urine 1.0 0.2 - 1.0 EU/dL    Nitrite, Urine Negative NEG      Leukocyte Esterase, Urine Negative NEG     Urinalysis, Micro    Collection Time: 06/20/22 11:42 PM   Result Value Ref Range    WBC, UA Negative 0 - 4 /hpf    RBC, UA Negative 0 - 5 /hpf    Epithelial Cells UA 1+ 0 - 5 /lpf    BACTERIA, URINE FEW (A) NEG /hpf   POCT Glucose    Collection Time: 06/20/22 11:57 PM   Result Value Ref Range    POC Glucose 228 (H) 70 - 110 mg/dL   Basic Metabolic Panel w/ Reflex to MG    Collection Time: 06/21/22  3:38 AM   Result Value Ref Range    Sodium 140 136 - 145 mmol/L     Potassium 3.3 (L) 3.5 - 5.5 mmol/L    Chloride 109 100 - 111 mmol/L    CO2 26 21 - 32 mmol/L    Anion Gap 5 3.0 - 18 mmol/L    Glucose 252 (H) 74 - 99 mg/dL    BUN 14 7.0 - 18 MG/DL    Creatinine 0.94 0.6 - 1.3 MG/DL    Bun/Cre Ratio 15 12 - 20      Est, Glom Filt Rate >60 >60 ml/min/1.49m    Calcium 9.1 8.5 - 10.1 MG/DL   CBC with Auto Differential    Collection Time: 06/21/22  3:38 AM   Result Value Ref Range    WBC 11.6 4.6 - 13.2 K/uL    RBC 3.75 (L) 4.20 -  5.30 M/uL    Hemoglobin 8.3 (L) 12.0 - 16.0 g/dL    Hematocrit 27.7 (L) 35.0 - 45.0 %    MCV 73.9 (L) 78.0 - 100.0 FL    MCH 22.1 (L) 24.0 - 34.0 PG    MCHC 30.0 (L) 31.0 - 37.0 g/dL    RDW 18.5 (H) 11.6 - 14.5 %    Platelets 302 135 - 420 K/uL    MPV 8.7 (L) 9.2 - 11.8 FL    Nucleated RBCs 0.0 0 PER 100 WBC    nRBC 0.00 0.00 - 0.01 K/uL    Neutrophils % 81 (H) 40 - 73 %    Lymphocytes % 5 (L) 21 - 52 %    Monocytes % 13 (H) 3 - 10 %    Eosinophils % 1 0 - 5 %    Basophils % 0 0 - 2 %    Immature Granulocytes 1 (H) 0.0 - 0.5 %    Neutrophils Absolute 9.4 (H) 1.8 - 8.0 K/UL    Lymphocytes Absolute 0.5 (L) 0.9 - 3.6 K/UL    Monocytes Absolute 1.5 (H) 0.05 - 1.2 K/UL    Eosinophils Absolute 0.1 0.0 - 0.4 K/UL    Basophils Absolute 0.0 0.0 - 0.1 K/UL    Absolute Immature Granulocyte 0.1 (H) 0.00 - 0.04 K/UL    Differential Type AUTOMATED     Magnesium    Collection Time: 06/21/22  3:38 AM   Result Value Ref Range    Magnesium 1.3 (L) 1.6 - 2.6 mg/dL       images and reports reviewed    Assessment:   Cozette Braggs is a 66 y.o. female who has multiple medical conditions and is having a chronic sacral decubitus ulcer with evidence of osteomyelitis and phlegmon.  For now no need for any urgent drainage or debridement over the weekend but will continue with antibiotics and assessment of for her osteomyelitis.     Plan:     Appreciate medicine management  IV antibiotics  I did a consultation for treatment of her osteomyelitis  I will discuss it with Dr. Bing Ree on  Monday for possible need for further debridement    Please call me if you have any questions (cell phone: [phone])     Signed By: Earlyne Iba, MD     June 21, 2022

## 2022-06-21 NOTE — Plan of Care (Signed)
Problem: Physical Therapy - Adult  Goal: By Discharge: Performs mobility at highest level of function for planned discharge setting.  See evaluation for individualized goals.  06/21/2022 1310 by Jefm Miles, PT  Outcome: Progressing  06/21/2022 1307 by Jefm Miles, PT  Note: Physical Therapy Goals:  Initiated 06/21/2022 to be met within 7-10 days.    1.  Patient will move from supine to sit and sit to supine  in bed with minimal assistance/contact guard assist.    2.  Patient will transfer from bed to chair and chair to bed with minimal assistance/contact guard assist using the least restrictive device.  3.  Patient will perform sit to stand with minimal assistance/contact guard assist.  4.  Patient will ambulate with minimal assistance/contact guard assist for 15 feet with the least restrictive device.   5.  Patient will ascend/descend 3 stairs with 1 handrail(s) with moderate assistance .    PLOF: used rolling walker for ambulation around home, lives with daughter who assists with ADLs, 1 story, 3 stairs to enter with handrailing   PHYSICAL THERAPY EVALUATION    Patient: [name] Short (66 y.o. female)  Date: 06/21/2022  Primary Diagnosis: Septicemia ([place]) [A41.9]  Sepsis ([place]) [A41.9]       Precautions: Fall Risk,     ASSESSMENT :  Pt positioned in left side lying when therapist entered room with daughter present. Pt willing to roll to right side to eat breakfast. Required max assist x 2 to roll and would flinch any time therapist touched LE s do to reported muscle spasms that are occur bilaterally but R>L. She was able to wiggle toes and plantarflex/dorsiflex ankles when asked. Pt left in right side lying with all needs within reach and denied any additional assistance upon end of evaluation.    DEFICITS/IMPAIRMENTS:    , Body Structures, Functions, Activity Limitations Requiring Skilled Therapeutic Intervention: Decreased functional mobility ;Decreased strength;Decreased endurance;Decreased balance;Increased  pain    Patient will benefit from skilled intervention to address the above impairments.  Patient's rehabilitation potential/Therapy Prognosis: Fair.  Factors which may influence rehabilitation potential include:   []         None noted  []         Mental ability/status  [x]         Medical condition  []         Home/family situation and support systems  []         Safety awareness  []         Pain tolerance/management  []         Other:      PLAN :  Recommendations and Planned Interventions:   [x]           Bed Mobility Training             [x]    Neuromuscular Re-Education  [x]           Transfer Training                   []    Orthotic/Prosthetic Training  [x]           Gait Training                          []    Modalities  [x]           Therapeutic Exercises           []  Edema Management/Control  [x]           Therapeutic Activities            [x]    Family Training/Education  [x]           Patient Education  []           Other (comment):    Frequency/Duration: Patient will be followed by physical therapy to address goals, 1-2 times per day/3-5 days per week to address goals.    Further Equipment Recommendations for Discharge: hospital bed, shower chair, and walker    AMPAC: AM-PAC Inpatient Mobility Raw Score : 7      Current research shows that an AM-PAC score of 17 (13 without stairs) or less is not associated with a discharge to the patient's home setting. Based on an AM-PAC score and their current functional mobility deficits, it is recommended that the patient have 3-5 sessions per week of Physical Therapy at d/c to increase the patient's independence.     This AMPAC score should be considered in conjunction with interdisciplinary team recommendations to determine the most appropriate discharge setting. Patient's social support, diagnosis, medical stability, and prior level of function should also be taken into consideration.     SUBJECTIVE:   Patient stated "I am not getting up today."    OBJECTIVE  DATA SUMMARY:     Past Medical History:   Diagnosis Date    Anemia     Arthritis     Chronic pain     Legs and Shoulders    Diabetes ([place])     Exposure to asbestos     History of blood transfusion     History of colon polyps     HTN (hypertension)     Hx of blood clots     DVT    Hyperlipemia     Menopause     Pulmonary nodule     Serum calcium elevated     Sleep apnea     not using cpap    Stroke [facility])      Past Surgical History:   Procedure Laterality Date    CESAREAN SECTION  1987    COLONOSCOPY N/A 01/13/2017    COLONOSCOPY performed by Joycelyn Schmid, MD at [place] N/A 12/28/2021    SACRAL WOUND DEBRIDEMENT INCISION AND DRAINAGE performed by Nestor Ramp, DO at [place] N/A 12/09/2021    CERVICAL THREE/FOUR/FIVE/SIX LAMINECTOMY FUSION; C-ARM; STRYKER; EXT BONE GROWTH STIM; 23 HR performed by Candi Leash, MD at [place] Situation:  Social/Functional History  Lives With: Daughter  Type of Home: House  Home Layout: One level  Home Access: Stairs to enter with rails  Entrance Stairs - Number of Steps: 3  Bathroom Toilet: Standard  Bathroom Equipment: Civil engineer, contracting, [employer]  Home Equipment: Environmental consultant, [employer], Wheelchair-manual, [employer] Help From: Family  ADL Assistance: Needs assistance  Ambulation Assistance: Needs assistance  Transfer Assistance: Needs assistance  Active Driver: No  Occupation: Unemployed  Critical Behavior:  Orientation  Overall Orientation Status: Within Normal Limits  Orientation Level: Oriented to person;Oriented to place;Oriented to situation  Cognition  Overall Cognitive Status: WFL    Strength:    Strength: Generally decreased, functional    Tone & Sensation:   Tone: Normal  Sensation: Intact    Range Of Motion:  AROM: Generally decreased, functional    Functional Mobility:  Bed Mobility:   Bed Mobility Training  Bed Mobility Training: Yes  Rolling: Maximum  assistance;Assist X2  Scooting: Total assistance;Assist X2    Pain:  Pain level pre-treatment: 0/10  Pain level post-treatment: 0/10   Pain Intervention(s): Medication (see MAR); Rest, Ice, Repositioning  Response to intervention: Nurse notified     Activity Tolerance:   Activity Tolerance: Patient limited by pain;Patient limited by endurance;Treatment limited secondary to medical complications  Please refer to the flowsheet for vital signs taken during this treatment.    After treatment:   []         Patient left in no apparent distress sitting up in chair  [x]         Patient left in no apparent distress in bed  [x]         Call bell left within reach  [x]         Nursing notified  [x]         Caregiver present  []         Bed alarm activated  []         SCDs applied    COMMUNICATION/EDUCATION:   Patient Education  Education Given To: Family;Patient  Education Provided: Role of Therapy;Plan of Care  Education Method: Verbal;Teach Back  Barriers to Learning: None  Education Outcome: Verbalized understanding    Thank you for this referral.  Jefm Miles, PT  Minutes: 24      Eval Complexity: Decision Making: Medium Complexity

## 2022-06-21 NOTE — Progress Notes (Addendum)
TRANSFER - IN REPORT:    Verbal report received from [place], RN on [name] Short  being received from ED for routine progression of patient care      Report consisted of patient's Situation, Background, Assessment and   Recommendations(SBAR).     Information from the following report(s) Nurse Handoff Report and Cardiac Rhythm NSR  was reviewed with the receiving nurse.    Opportunity for questions and clarification was provided.      Assessment completed upon patient's arrival to unit and care assumed.        0215:  Assumed care of patient.     3419:  Tylenol given for temp of 100.7

## 2022-06-21 NOTE — Care Coordination-Inpatient (Signed)
06/21/22 1056   Service Assessment   Patient Orientation Alert and Oriented;Person;Place;Situation   Cognition Alert   History Provided By Patient;Child/Family  (daughter [place] via phone)   Primary Caregiver Self   Support Systems Children;Family Members   Patient's Healthcare Decision Maker is: Named in Scanned ACP Document   PCP Verified by CM Yes   Last Visit to PCP Within last 3 months   Prior Functional Level Assistance with the following:;Bathing;Dressing;Toileting;Mobility   Current Functional Level Assistance with the following:;Bathing;Dressing;Toileting;Mobility   Can patient return to prior living arrangement Yes   Ability to make needs known: Good   Family able to assist with home care needs: Yes   Would you like for me to discuss the discharge plan with any other family members/significant others, and if so, who? Yes  (Daughter [place])   Financial controller;Other (Comment)  Biomedical engineer)   Community Resources ECF/Home Care   Social/Functional History   Lives With Daughter   Type of Home House   Home Layout One level   Home Access Stairs to enter with rails   Entrance Stairs - Number of Steps 3   Bathroom Designer, industrial/product, rolling;Wheelchair-manual;Cane   Receives Help From Family   ADL Assistance Needs assistance   Ambulation Assistance Needs assistance   Transfer Assistance Needs assistance   Active Driver No   Occupation Unemployed   Discharge Planning   Type of Residence House   Living Arrangements Children   Current Services Prior To Admission Home Care   Potential Assistance Needed Home Care;Skilled Nursing Facility   Type of Home Care Services Aide Services;Nursing Services   Patient expects to be discharged to:   (TBD)   Services At/After Discharge   Transition of Care Consult (CM Consult) Home Health;SNF   Internal Home Health Yes   Partner SNF Yes   Services At/After Discharge OT;PT;Nursing services   Mode of Transport at  Discharge Other (see comment)  (pt's daughter Michelle Nasuti)   Confirm Follow Up Transport Family   Condition of Participation: Discharge Planning   The Patient and/or Patient Representative was provided with a Choice of Provider? Patient;Patient Representative   Name of the Patient Representative who was provided with the Choice of Provider and agrees with the Discharge Plan?  pt's daughter Michelle Nasuti   The Patient and/Or Patient Representative agree with the Discharge Plan? Yes   Freedom of Choice list was provided with basic dialogue that supports the patient's individualized plan of care/goals, treatment preferences, and shares the quality data associated with the providers?  Yes           Case Management Assessment  Initial Evaluation    Date/Time of Evaluation: 06/21/2022 11:07 AM  Assessment Completed by: Raynelle Bring, RN    If patient is discharged prior to next notation, then this note serves as note for discharge by case management.    Patient Name: [name] Short                   Date of Birth: [birth year]  Diagnosis: Septicemia (HCC) [A41.9]  Sepsis (HCC) [A41.9]                   Date / Time: 06/20/2022  5:07 PM    Patient Admission Status: Inpatient   Readmission Risk (Low < 19, Mod (19-27), High > 27): Readmission Risk Score: 18.8    Current PCP: Will Bonnet, PA  PCP verified by  CM? Yes    Chart Reviewed: Yes      History Provided by: Patient, Child/Family (daughter [place] via phone)  Patient Orientation: Alert and Oriented, Person, Place, Situation    Patient Cognition: Alert    Hospitalization in the last 30 days (Readmission):  No    If yes, Readmission Assessment in CM Navigator will be completed.    Advance Directives:      Code Status: Full Code   Patient's Primary Decision Maker is: Named in Scanned ACP Document    Primary Decision MakerStephanee, [place] - Child [phone]    Secondary Decision Maker: Barbaraann Barthel    Discharge Planning:    Patient lives with: (P) Children Type of Home: (P)  House  Primary Care Giver: Self  Patient Support Systems include: Children, Family Members   Current Financial resources: (P) Medicaid, Other (Comment) ([employer])  Current community resources: (P) ECF/Home Care  Current services prior to admission: (P) Home Care            Current DME:              Type of Home Care services:  (P) Aide Services, Nursing Services    ADLS  Prior functional level: Assistance with the following:, Bathing, Dressing, Toileting, Mobility  Current functional level: Assistance with the following:, Bathing, Dressing, Toileting, Mobility    PT AM-PAC:   /24  OT AM-PAC:   /24    Family can provide assistance at DC: Yes  Would you like Case Management to discuss the discharge plan with any other family members/significant others, and if so, who? Yes (Daughter Jiles Garter)  Plans to Return to Present Housing: Yes  Other Identified Issues/Barriers to RETURNING to current housing:   Potential Assistance needed at discharge: (P) [place], [place]            Potential DME:    Patient expects to discharge to: (P)  (TBD)  Plan for transportation at discharge: (P) Family    Financial    Payor: [employer] / Plan: [employer] HMO / Product Type: *No Product type* /     Does insurance require precert for SNF: Yes    Potential assistance Purchasing Medications:    Meds-to-Beds request:        [id number] [address], [address] [place]  [place]  [place] [address]  Phone: [phone] Fax: [phone]      Notes:    Factors facilitating achievement of predicted outcomes: Family support, Cooperative, and Pleasant    Barriers to discharge:     Additional Case Management Notes:     The Plan for Transition of Care is related to the following treatment goals of Septicemia ([place]) [A41.9]  Sepsis ([place]) [I29.7]    IF APPLICABLE: The Patient and/or patient representative [place] and her family were provided with a choice of provider and agrees with the  discharge plan. Freedom of choice list with basic dialogue that supports the patient's individualized plan of care/goals and shares the quality data associated with the providers was provided to: (P) Patient, Patient Representative   Patient Representative Name: (P) pt's daughter Jiles Garter     The Patient and/or Patient Representative Agree with the Discharge Plan? (P) Yes    Cleatrice Burke, RN  Case Management Department

## 2022-06-21 NOTE — Progress Notes (Incomplete Revision)
@  1938 Bedside and Verbal shift change report given to Jess, RN (oncoming nurse) by Lesly Rubenstein, RN (offgoing nurse). Report included the following information . Nurse Handoff Report, Index, Adult Overview, Intake/Output, MAR, Cardiac Rhythm tachy, Quality Measures, and Event Log    @1938  Pt turned right.  @2130  Pt turned left  @2320  Pt c/o tingling fingers. Pt stated that she has felt this before. Pt stated she took gabapentin in the past. Will cont to monitor pt. Attempted to reduce pt blankets. Pt refused. Pt has a low grade temp. Will continue to monitor at this time. Canister emptied and pt assessed. BG rechecked.

## 2022-06-21 NOTE — Progress Notes (Signed)
Attempt was made to visit but patient is comfortably resting. Patient is not available to be assessed at this time.      Verl Dicker, [place]   [phone]

## 2022-06-21 NOTE — Progress Notes (Signed)
Progress Note  Hospitalist Service    Patient: [name] Short MRN: [id number]   SSN: [id number]  Date of Birth: Dec 17, 1955   Age: 66 y.o.  Sex: female      Admit Date: 06/20/2022    LOS: 1 day   Chief Complaint   Patient presents with    Fatigue       Subjective:     Patient seen and examined.  She is currently comfortable; pain well managed.  Patient with some concerns about quality of home health she's receiving.     Objective:     Vitals:  BP (!) 104/56   Pulse 87   Temp 100 F (37.8 C) (Axillary)   Resp 17   Ht 1.499 m (4\' 11" )   Wt 77.5 kg (170 lb 13.7 oz)   SpO2 99%   BMI 34.51 kg/m     Physical Exam:   General appearance: alert, appears stated age, and cooperative  Lungs: clear to auscultation bilaterally  Heart: regular rate and rhythm, S1, S2 normal, no murmur, click, rub or gallop  Abdomen: soft, non-tender.   Buttocks - wound recently bandaged; exam deferred for today. Pictures reviewed.   Pulses: 2+ and symmetric  Skin: Skin color, texture, turgor normal. No rashes or lesions  Neuro:  normal without focal findings, mental status, speech normal, alert and oriented x3, PERLA, and reflexes normal and symmetric    Intake and Output:  Current Shift: 11/18 0701 - 11/18 1900  In: 240 [P.O.:240]  Out: 725 [Urine:725]  Last three shifts: No intake/output data recorded.    Lab/Data Review:  Recent Results (from the past 12 hour(s))   Basic Metabolic Panel w/ Reflex to MG    Collection Time: 06/21/22  3:38 AM   Result Value Ref Range    Sodium 140 136 - 145 mmol/L    Potassium 3.3 (L) 3.5 - 5.5 mmol/L    Chloride 109 100 - 111 mmol/L    CO2 26 21 - 32 mmol/L    Anion Gap 5 3.0 - 18 mmol/L    Glucose 252 (H) 74 - 99 mg/dL    BUN 14 7.0 - 18 MG/DL    Creatinine 06/23/22 0.6 - 1.3 MG/DL    Bun/Cre Ratio 15 12 - 20      Est, Glom Filt Rate >60 >60 ml/min/1.55m2    Calcium 9.1 8.5 - 10.1 MG/DL   CBC with Auto Differential    Collection Time: 06/21/22  3:38 AM   Result Value Ref Range    WBC 11.6 4.6 - 13.2 K/uL    RBC  3.75 (L) 4.20 - 5.30 M/uL    Hemoglobin 8.3 (L) 12.0 - 16.0 g/dL    Hematocrit 06/23/22 (L) 35.0 - 45.0 %    MCV 73.9 (L) 78.0 - 100.0 FL    MCH 22.1 (L) 24.0 - 34.0 PG    MCHC 30.0 (L) 31.0 - 37.0 g/dL    RDW 77.4 (H) 12.8 - 14.5 %    Platelets 302 135 - 420 K/uL    MPV 8.7 (L) 9.2 - 11.8 FL    Nucleated RBCs 0.0 0 PER 100 WBC    nRBC 0.00 0.00 - 0.01 K/uL    Neutrophils % 81 (H) 40 - 73 %    Lymphocytes % 5 (L) 21 - 52 %    Monocytes % 13 (H) 3 - 10 %    Eosinophils % 1 0 - 5 %    Basophils % 0 0 -  2 %    Immature Granulocytes 1 (H) 0.0 - 0.5 %    Neutrophils Absolute 9.4 (H) 1.8 - 8.0 K/UL    Lymphocytes Absolute 0.5 (L) 0.9 - 3.6 K/UL    Monocytes Absolute 1.5 (H) 0.05 - 1.2 K/UL    Eosinophils Absolute 0.1 0.0 - 0.4 K/UL    Basophils Absolute 0.0 0.0 - 0.1 K/UL    Absolute Immature Granulocyte 0.1 (H) 0.00 - 0.04 K/UL    Differential Type AUTOMATED     Magnesium    Collection Time: 06/21/22  3:38 AM   Result Value Ref Range    Magnesium 1.3 (L) 1.6 - 2.6 mg/dL   POCT Glucose    Collection Time: 06/21/22  8:12 AM   Result Value Ref Range    POC Glucose 238 (H) 70 - 110 mg/dL   POCT Glucose    Collection Time: 06/21/22 11:03 AM   Result Value Ref Range    POC Glucose 227 (H) 70 - 110 mg/dL         Key Findings or tests:       Telemetry NONE   Oxygen NONE     Assessment and Plan:     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   #1. Surgery (Drs. Forest Gleason, Criselda Peaches) following. Patient may required debridement possibly Monday. Decision not yet made. Continue vanc, Zosyn. ID - Dr. Jerilynn Mages. Patel consulted. Wound care consulted.   DMT2, insulin dependent with hyperglycemia - SSI + Lantus   Hypertension, essential - does not appear to be taking amlodipine or home antihypertensives any longer. Will monitor. Add back amlodipine if needed.   History of CVA - on ASA, statin   Hyperlipidemia - continue statin.   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 PT/OT recs pending.          Freddie Apley, DO, hospitalist   June 21, 2022

## 2022-06-21 NOTE — Progress Notes (Addendum)
@  1938 Bedside and Verbal shift change report given to Jess, RN (oncoming nurse) by Lesly Rubenstein, RN (offgoing nurse). Report included the following information . Nurse Handoff Report, Index, Adult Overview, Intake/Output, MAR, Cardiac Rhythm tachy, Quality Measures, and Event Log  @1938  Pt turned left.  @2130  Pt turned right Head to toe complete.  @2320  Pt c/o tingling fingers. Pt stated that she has felt this before. Pt stated she took gabapentin in the past. Will cont to monitor pt. Attempted to reduce pt blankets. Pt refused. Pt has a low grade temp. Will continue to monitor at this time. Canister emptied and pt assessed. BG rechecked.  @0038  pt turned to the left. Pt side sleeping.   @0242  pt turned to right side. Meds given. Pt educated  @0458  pt refused to be turned at this time. Pt sleeping in between care. Pt refused bed bath at this time. Pt informed that change of shift is in a couple of hours and maybe a while before pt can get a bath. Pt still refusing at this time.   Bedside shift report given to Lesly Rubenstein, RN

## 2022-06-21 NOTE — Plan of Care (Signed)
Problem: Occupational Therapy - Adult  Goal: By Discharge: Performs self-care activities at highest level of function for planned discharge setting.  See evaluation for individualized goals.  Description: Occupational Therapy Goals:  Initiated 06/21/2022 to be met within 7-10 days.    1.  Patient will perform bed mobility for ADLs and repositioning with minimal assistance/contact guard assist.   2.  Patient will perform upper body dressing with supervision/set-up.  3.  Patient will perform functional activity at EOB with supervision/set-up.  4.  Patient will perform bathing with moderate assistance .  5.  Patient will participate in upper extremity therapeutic exercise/activities with supervision/set-up for 8 minutes to improve endurance and UB strength needed for ADLs    6.  Patient will utilize energy conservation techniques during functional activities with verbal cues.    PLOF: Pt lives with daughter, reports recently has been working with HHPT on walking, was [facility] from [facility] due to being able to complete her ADLs with set-up, daughter and PCA assisting mostly with IADLs. PCA comes in intermittently during the week, daughter is pt's primary caregiver. Per pt and daughter since 1 week ago pt requires significant amount of assist for functional mobility.   Outcome: Progressing  OCCUPATIONAL THERAPY EVALUATION    Patient: [name] Short (66 y.o. female)  Date: 06/21/2022  Primary Diagnosis: Septicemia ([place]) [A41.9]  Sepsis ([place]) [A41.9]       Precautions: Fall Risk    ASSESSMENT :    Pt cleared to participate in OT evaluation by RN. Upon entering room, pt received in bed, alert, and agreeable to OT eval/treatment with supportive and insightful. Pt seen in conjunction with PT to maximize safety of patient and staff members. Pt c/o severe pain in sacral area, noted Mepilex dressing. Pt adamantly refusing to maneuver to EOB for ADLs, agreeable to bed mobility for repositioning. Pt required increased time and Max Ax2 to  roll in bed to achieve optimal position for self-feeding while offloading sacral wound. Pt with LLE muscle spasms during all bed mobility and with light touch, reports chronic. Pt was set-up for self-feeding at the end of the session, able to complete without assistance.     DEFICITS/IMPAIRMENTS:  Performance deficits / Impairments: Decreased functional mobility ;Decreased ADL status;Decreased strength;Decreased safe awareness;Decreased cognition;Decreased endurance    Patient will benefit from skilled intervention to address the above impairments.  Patient's rehabilitation potential/Prognosis: Guarded.  Factors which may influence rehabilitation potential include:   _0              None noted  _1              Mental ability/status  _2              Medical condition  _3              Home/family situation and support systems  _4              Safety awareness  _5              Pain tolerance/management  _6              Other:      PLAN :  Recommendations and Planned Interventions:   _7                Self Care Training                  _8       Therapeutic Activities  _9   Functional Mobility Training   _0       Cognitive Retraining  _1                Therapeutic Exercises           _2       Endurance Activities  _3                Balance Training                    _4       Neuromuscular Re-Education  _5                Visual/Perceptual Training     _6       Home Safety Training  _7                Patient Education                   _8       Family Training/Education  _9                Other (comment):    Frequency/Duration: Patient will be followed by occupational therapy to address goals, 1-2 times per day/3-5 days per week to address goals.    Further Equipment Recommendations for Discharge: hospital bed    AMPAC: AM-PAC Inpatient Daily Activity Raw Score: 12  Current research shows that an AM-PAC score of 17 or less is not associated with a discharge to the patient's home setting.  Based on an AM-PAC score and  their current ADL deficits; it is recommended that the patient have 3-5 sessions per week of Occupational Therapy at d/c to increase the patient's independence.      This AMPAC score should be considered in conjunction with interdisciplinary team recommendations to determine the most appropriate discharge setting. Patient's social support, diagnosis, medical stability, and prior level of function should also be taken into consideration.     SUBJECTIVE:   Patient stated, "I am not sitting up, I'm sorry."    OBJECTIVE DATA SUMMARY:     Past Medical History:   Diagnosis Date    Anemia     Arthritis     Chronic pain     Legs and Shoulders    Diabetes ([place])     Exposure to asbestos     History of blood transfusion     History of colon polyps     HTN (hypertension)     Hx of blood clots     DVT    Hyperlipemia     Menopause     Pulmonary nodule     Serum calcium elevated     Sleep apnea     not using cpap    Stroke [facility])      Past Surgical History:   Procedure Laterality Date    CESAREAN SECTION  1987    COLONOSCOPY N/A 01/13/2017    COLONOSCOPY performed by Joycelyn Schmid, MD at [place] N/A 12/28/2021    SACRAL WOUND DEBRIDEMENT INCISION AND DRAINAGE performed by Nestor Ramp, DO at [place] N/A 12/09/2021    CERVICAL THREE/FOUR/FIVE/SIX LAMINECTOMY FUSION; C-ARM; STRYKER; EXT BONE GROWTH STIM; 23 HR performed by Candi Leash, MD at [place] Situation:   Social/Functional History  Lives With: Daughter  Type  of Home: House  Home Layout: One level  Home Access: Stairs to enter with rails  Entrance Stairs - Number of Steps: 3  Bathroom Toilet: Standard  Bathroom Equipment: Civil engineer, contracting, [employer]  Home Equipment: Environmental consultant, [place], [place], [employer] Help From: Family  ADL Assistance: Needs assistance  Ambulation Assistance: Needs assistance  Transfer Assistance: Needs assistance  Active Driver:  No  Occupation: Unemployed  _0   Right hand dominant   _1   Left hand dominant    Cognitive/Behavioral Status:  Orientation  Overall Orientation Status: Within Functional Limits  Orientation Level: Oriented to person;Oriented to place;Oriented to situation  Cognition  Overall Cognitive Status: WFL    Skin: dressing over buttocks wound  Edema: none noted    Vision/Perceptual:    Vision  Vision: Within Functional Limits        Coordination: BUE  Coordination: Generally decreased, functional      Strength: BUE  Strength: Generally decreased, functional (RUE grossly 4+/5 LUE grossly 3+/5)    Tone & Sensation: BUE  Tone: Normal  Sensation: Impaired (pt reports numbness to all digits of BUE)    Range of Motion: BUE  AROM: Generally decreased, functional   Functional Mobility and Transfers for ADLs:  Bed Mobility:     Bed Mobility Training  Bed Mobility Training: Yes  Rolling: Maximum assistance;Assist X2  Scooting: Total assistance;Assist X2  Transfers:   NT pt declined  ADL Assessment:   Feeding: Supervision;Setup  Grooming: Stand by assistance  UE Bathing: Moderate assistance  LE Bathing: Maximum assistance  UE Dressing: Minimal assistance  LE Dressing: Maximum assistance  Toileting: Dependent/Total    ADL Intervention:  Self-feeding with Supervision after set-up  UB dressing with Mod A  Socks management with Total A, limited tolerance to touch of LLE    Pain:  Pain level pre-treatment: not rated  Pain level post-treatment: not rated    Activity Tolerance:   Activity Tolerance: Patient limited by pain  Please refer to the flowsheet for vital signs taken during this treatment.    After treatment:   _2  Patient left in no apparent distress sitting up in chair  _3  Patient left in no apparent distress in bed  _4  Call bell left within reach  _5  Nursing notified  _6  Caregiver present  _7  Bed alarm activated    COMMUNICATION/EDUCATION:   Patient Education  Education Given To: Patient;Family  Education Provided: Role of  Therapy;ADL Adaptive Strategies;Energy Conservation;Plan of Care  Education Method: Demonstration;Verbal;Teach Back  Barriers to Learning: None  Education Outcome: Continued education needed    Thank you for this referral.  Rich Number, OTR/L  Minutes: 19    Eval Complexity: Decision Making: Medium Complexity

## 2022-06-21 NOTE — Care Coordination-Inpatient (Signed)
Spoke with pt's daughter Jiles Garter on the phone.  She stated she is Medicaid paid caregiver for pt.  She will like for pt to go for rehab at the acute rehab unit in the hospital.  Informed her CM will notify Attending so PTOT can be ordered to see pt and we will follow recommendations.  Informed her that if they recommend SNF or inpatient rehab, we will have to send pt's clinicals to as many SNF as possible and if we have acceptance, we will let her know for her to make her choice.  She expressed understanding.  Pt is also active with [facility].        Sent message to Dr. Nevada Crane to order PTOT to evaluate pt when appropriate.       Lequita Halt, BSN RN  Care Management

## 2022-06-21 NOTE — Progress Notes (Signed)
[place] Pharmacokinetic Monitoring Service - Vancomycin     [name] Short is a 66 y.o. female starting on vancomycin therapy for Sepsis of Unknown Etiology. Pharmacy consulted for monitoring and adjustment.    Target Concentration: Goal AUC/MIC 400-600 mg*hr/L    Additional Antimicrobials: Piperacillin/Tazobactam    Pertinent Laboratory Values:   Temp: 100 F (37.8 C), Weight - Scale: 77.5 kg (170 lb 13.7 oz)  Recent Labs     06/20/22  1820   CREATININE 0.88   BUN 15   WBC 11.3   PROCAL 0.79     Estimated Creatinine Clearance: 61 mL/min (based on SCr of 0.88 mg/dL).    Pertinent Cultures:  Culture Date Source Results        MRSA Nasal Swab: N/A. Non-respiratory infection    Plan:  Dosing recommendations based on Bayesian software  Start vancomycin 1500 mg x1 then 750mg  q12  Anticipated AUC of 598 and trough concentration of 18.7 at steady state  Renal labs as indicated   Vancomycin concentration ordered for AM labs tomorrow  Pharmacy will continue to monitor patient and adjust therapy as indicated    Thank you for the consult,  Doreene Nest, [facility]  06/21/2022

## 2022-06-21 NOTE — Progress Notes (Signed)
Chaplain conducted a Follow up consultation and Spiritual Assessment for [name] who is a 66 y.o.,female.      The Chaplain provided the following Interventions:  Continued the relationship of care and support. Jiles Garter (daughter) was in the room with patient.  Listened empathically.  Offered assurance of continued prayer on behalf of the patient. Provided Daily Bread devotional.    Intervention/Outcome:  Patient has some concerns with her medical issue but has that hopeful spirit in her.  Patient expressed gratitude to Chaplain's visit.    Plan:  Continue support as per need or as a request basis.  All caregivers, family members, call Chaplain if patient develops any spiritual or emotional crises.    Ricarda Frame [place]

## 2022-06-21 NOTE — Progress Notes (Signed)
0745-Bedside and Verbal shift change report given to Duanne Moron (oncoming nurse) by Doroteo Bradford RN (offgoing nurse). Report included the following information Nurse Handoff Report, Adult Overview, Intake/Output, MAR, Recent Results, Cardiac Rhythm NSR, and Neuro Assessment. Pts daughter at the bedside. Bone stimulator alarming poor connection. Electrodes in place and connected. Pt right side lying. SCDs in place and on.     0931- Pt in bed alert and oriented times 4 with bed locked and low and call bell in reach. Needs addressed, denies pain. Heel boots applied.  Pt medicated per MAR.       1130-Pt in bed alert and oriented times 4 with bed locked and low and call bell in reach. Needs addressed, denies pain. Pt medicated per MAR.     Wound care provided to tunneling sacral wound. Large effusion of drainge noted soiling old dressing. Dressing removed and site cleaned. Purulent drainage, slough, old and fresh blood noted. Wound was cleansed with wound spray. Wound measured as 0.4 cm wide, 0.2 cm long and 5 cm deep. Wound was packed with opticell AG tape, the wound opening was covered with sterile gauze. The wound boarder was cleaned with skin prep and a sacral alyven was placed over the site.      1204- Pt mag replaced 2 grams per protocol.     1415- Pt mag replaced 2 grams per protocol     1520- Pt K+ replaced 40 meq per Protocol.     1625- Pt in bed alert and oriented times 4 with bed locked and low and call bell in reach. Needs addressed, denies pain.     1749- Pt medicated per MAR.

## 2022-06-21 NOTE — Plan of Care (Signed)
Problem: Discharge Planning  Goal: Discharge to home or other facility with appropriate resources  Outcome: Progressing     Problem: Pain  Goal: Verbalizes/displays adequate comfort level or baseline comfort level  Outcome: Progressing     Problem: Skin/Tissue Integrity  Goal: Absence of new skin breakdown  Description: 1.  Monitor for areas of redness and/or skin breakdown  2.  Assess vascular access sites hourly  3.  Every 4-6 hours minimum:  Change oxygen saturation probe site  4.  Every 4-6 hours:  If on nasal continuous positive airway pressure, respiratory therapy assess nares and determine need for appliance change or resting period.  Outcome: Progressing     Problem: Safety - Adult  Goal: Free from fall injury  Outcome: Progressing     Problem: ABCDS Injury Assessment  Goal: Absence of physical injury  Outcome: Progressing     Problem: Occupational Therapy - Adult  Goal: By Discharge: Performs self-care activities at highest level of function for planned discharge setting.  See evaluation for individualized goals.  Description: Occupational Therapy Goals:  Initiated 06/21/2022 to be met within 7-10 days.    1.  Patient will perform bed mobility for ADLs and repositioning with minimal assistance/contact guard assist.   2.  Patient will perform upper body dressing with supervision/set-up.  3.  Patient will perform functional activity at EOB with supervision/set-up.  4.  Patient will perform bathing with moderate assistance .  5.  Patient will participate in upper extremity therapeutic exercise/activities with supervision/set-up for 8 minutes to improve endurance and UB strength needed for ADLs    6.  Patient will utilize energy conservation techniques during functional activities with verbal cues.    PLOF: Pt lives with daughter, reports recently has been working with HHPT on walking, was [facility] due to being able to complete her ADLs with set-up, daughter and PCA assisting mostly with IADLs. PCA comes  in intermittently during the week, daughter is pt's primary caregiver. Per pt and daughter since 1 week ago pt requires significant amount of assist for functional mobility.   06/21/2022 1219 by Rich Number, OTR/L  Outcome: Progressing     Problem: Physical Therapy - Adult  Goal: By Discharge: Performs mobility at highest level of function for planned discharge setting.  See evaluation for individualized goals.  06/21/2022 1310 by Jefm Miles, PT  Outcome: Progressing  06/21/2022 1307 by Jefm Miles, PT  Note: Physical Therapy Goals:  Initiated 06/21/2022 to be met within 7-10 days.    1.  Patient will move from supine to sit and sit to supine  in bed with minimal assistance/contact guard assist.    2.  Patient will transfer from bed to chair and chair to bed with minimal assistance/contact guard assist using the least restrictive device.  3.  Patient will perform sit to stand with minimal assistance/contact guard assist.  4.  Patient will ambulate with minimal assistance/contact guard assist for 15 feet with the least restrictive device.   5.  Patient will ascend/descend 3 stairs with 1 handrail(s) with moderate assistance .    PLOF: used rolling walker for ambulation around home, lives with daughter who assists with ADLs, 1 story, 3 stairs to enter with handrailing

## 2022-06-22 LAB — POCT GLUCOSE
POC Glucose: 132 mg/dL — ABNORMAL HIGH (ref 70–110)
POC Glucose: 225 mg/dL — ABNORMAL HIGH (ref 70–110)
POC Glucose: 236 mg/dL — ABNORMAL HIGH (ref 70–110)
POC Glucose: 255 mg/dL — ABNORMAL HIGH (ref 70–110)
POC Glucose: 257 mg/dL — ABNORMAL HIGH (ref 70–110)
POC Glucose: 331 mg/dL — ABNORMAL HIGH (ref 70–110)
POC Glucose: 348 mg/dL — ABNORMAL HIGH (ref 70–110)
POC Glucose: 367 mg/dL — ABNORMAL HIGH (ref 70–110)
POC Glucose: 369 mg/dL — ABNORMAL HIGH (ref 70–110)

## 2022-06-22 LAB — CBC WITH AUTO DIFFERENTIAL
Basophils %: 0 % (ref 0–2)
Basophils Absolute: 0 10*3/uL (ref 0.0–0.1)
Eosinophils %: 1 % (ref 0–5)
Eosinophils Absolute: 0.1 10*3/uL (ref 0.0–0.4)
Hematocrit: 22.8 % — ABNORMAL LOW (ref 35.0–45.0)
Hemoglobin: 6.8 g/dL — ABNORMAL LOW (ref 12.0–16.0)
Immature Granulocytes %: 1 % — ABNORMAL HIGH (ref 0.0–0.5)
Immature Granulocytes Absolute: 0.1 10*3/uL — ABNORMAL HIGH (ref 0.00–0.04)
Lymphocytes %: 6 % — ABNORMAL LOW (ref 21–52)
Lymphocytes Absolute: 0.6 10*3/uL — ABNORMAL LOW (ref 0.9–3.6)
MCH: 21.6 pg — ABNORMAL LOW (ref 24.0–34.0)
MCHC: 29.8 g/dL — ABNORMAL LOW (ref 31.0–37.0)
MCV: 72.4 FL — ABNORMAL LOW (ref 78.0–100.0)
MPV: 9.4 FL (ref 9.2–11.8)
Monocytes %: 13 % — ABNORMAL HIGH (ref 3–10)
Monocytes Absolute: 1.3 10*3/uL — ABNORMAL HIGH (ref 0.05–1.2)
Neutrophils %: 80 % — ABNORMAL HIGH (ref 40–73)
Neutrophils Absolute: 8 10*3/uL (ref 1.8–8.0)
Nucleated RBCs: 0 /100{WBCs}
Platelets: 304 10*3/uL (ref 135–420)
RBC: 3.15 M/uL — ABNORMAL LOW (ref 4.20–5.30)
RDW: 18.7 % — ABNORMAL HIGH (ref 11.6–14.5)
WBC: 10.1 10*3/uL (ref 4.6–13.2)
nRBC: 0 10*3/uL (ref 0.00–0.01)

## 2022-06-22 LAB — BASIC METABOLIC PANEL W/ REFLEX TO MG FOR LOW K
Anion Gap: 4 mmol/L (ref 3.0–18)
BUN/Creatinine Ratio: 18 (ref 12–20)
BUN: 16 mg/dL (ref 7.0–18)
CO2: 25 mmol/L (ref 21–32)
Calcium: 9.3 mg/dL (ref 8.5–10.1)
Chloride: 111 mmol/L (ref 100–111)
Creatinine: 0.91 mg/dL (ref 0.6–1.3)
Est, Glom Filt Rate: >60 mL/min/{1.73_m2} (ref >60)
Glucose: 140 mg/dL — ABNORMAL HIGH (ref 74–99)
Potassium: 3.7 mmol/L (ref 3.5–5.5)
Sodium: 140 mmol/L (ref 136–145)

## 2022-06-22 LAB — VANCOMYCIN LEVEL, RANDOM: Vancomycin Rm: 20.3 ug/mL (ref 5.0–40.0)

## 2022-06-22 MED FILL — VANCOMYCIN HCL 1 G IV SOLR: 1 g | INTRAVENOUS | Qty: 1000

## 2022-06-22 MED FILL — HEPARIN SODIUM (PORCINE) 5000 UNIT/ML IJ SOLN: 5000 UNIT/ML | INTRAMUSCULAR | Qty: 1

## 2022-06-22 MED FILL — PIPERACILLIN SOD-TAZOBACTAM SO 3.375 (3-0.375) G IV SOLR: 3.375 (3-0.375) g | INTRAVENOUS | Qty: 3375

## 2022-06-22 MED FILL — INSULIN LISPRO 100 UNIT/ML IJ SOLN: 100 UNIT/ML | INTRAMUSCULAR | Qty: 6

## 2022-06-22 MED FILL — ASPIRIN ADULT 325 MG PO TABS: 325 MG | ORAL | Qty: 1

## 2022-06-22 MED FILL — BACLOFEN 10 MG PO TABS: 10 MG | ORAL | Qty: 1

## 2022-06-22 MED FILL — FAMOTIDINE 20 MG PO TABS: 20 MG | ORAL | Qty: 1

## 2022-06-22 MED FILL — ATORVASTATIN CALCIUM 40 MG PO TABS: 40 MG | ORAL | Qty: 2

## 2022-06-22 MED FILL — INSULIN GLARGINE 100 UNIT/ML SC SOLN: 100 UNIT/ML | SUBCUTANEOUS | Qty: 10

## 2022-06-22 MED FILL — ACETAMINOPHEN 325 MG PO TABS: 325 MG | ORAL | Qty: 2

## 2022-06-22 MED FILL — CULTURELLE PO CAPS: ORAL | Qty: 1

## 2022-06-22 MED FILL — FERROUS SULFATE 325 (65 FE) MG PO TABS: 325 (65 Fe) MG | ORAL | Qty: 1

## 2022-06-22 NOTE — Progress Notes (Addendum)
Progress Note  Hospitalist Service    Patient: [name] Short MRN: [id number]   SSN: [id number]  Date of Birth: 12-28-1955   Age: 66 y.o.  Sex: female      Admit Date: 06/20/2022    LOS: 2 days   Chief Complaint   Patient presents with    Fatigue       Subjective:     Patient seen and examined.  Multiple non-medical concerns today. Patient continues to be frustrated about her wound and the care she received before hospitalization.   Febrile overnight.     Objective:     Vitals:  BP 117/64   Pulse (!) 105   Temp (!) 100.9 F (38.3 C) (Oral)   Resp 25   Ht 1.499 m (4\' 11" )   Wt 81.1 kg (178 lb 12.7 oz)   SpO2 95%   BMI 36.11 kg/m     Physical Exam:   General appearance: alert, appears stated age, and cooperative  Lungs: clear to auscultation bilaterally  Heart: regular rate and rhythm, S1, S2 normal, no murmur, click, rub or gallop  Abdomen: soft, non-tender.   Buttocks - wound recently bandaged; exam deferred for today. Pictures reviewed.   Pulses: 2+ and symmetric  Skin: Skin color, texture, turgor normal. No rashes or lesions  Neuro:  normal without focal findings, mental status, speech normal, alert and oriented x3, PERLA, and reflexes normal and symmetric    Intake and Output:  Current Shift: 11/19 0701 - 11/19 1900  In: 463.3   Out: -   Last three shifts: 11/17 1901 - 11/19 0700  In: 490 [P.O.:480; I.V.:10]  Out: 2125 [Urine:2125]    Lab/Data Review:  Recent Results (from the past 12 hour(s))   POCT Glucose    Collection Time: 06/21/22  9:49 PM   Result Value Ref Range    POC Glucose 257 (H) 70 - 110 mg/dL   POCT Glucose    Collection Time: 06/21/22 11:13 PM   Result Value Ref Range    POC Glucose 236 (H) 70 - 110 mg/dL   Basic Metabolic Panel w/ Reflex to MG    Collection Time: 06/22/22  4:33 AM   Result Value Ref Range    Sodium 140 136 - 145 mmol/L    Potassium 3.7 3.5 - 5.5 mmol/L    Chloride 111 100 - 111 mmol/L    CO2 25 21 - 32 mmol/L    Anion Gap 4 3.0 - 18 mmol/L    Glucose 140 (H) 74 - 99 mg/dL     BUN 16 7.0 - 18 MG/DL    Creatinine 0.91 0.6 - 1.3 MG/DL    Bun/Cre Ratio 18 12 - 20      Est, Glom Filt Rate >60 >60 ml/min/1.70m2    Calcium 9.3 8.5 - 10.1 MG/DL   CBC with Auto Differential    Collection Time: 06/22/22  4:33 AM   Result Value Ref Range    WBC 10.1 4.6 - 13.2 K/uL    RBC 3.15 (L) 4.20 - 5.30 M/uL    Hemoglobin 6.8 (L) 12.0 - 16.0 g/dL    Hematocrit 22.8 (L) 35.0 - 45.0 %    MCV 72.4 (L) 78.0 - 100.0 FL    MCH 21.6 (L) 24.0 - 34.0 PG    MCHC 29.8 (L) 31.0 - 37.0 g/dL    RDW 18.7 (H) 11.6 - 14.5 %    Platelets 304 135 - 420 K/uL    MPV 9.4 9.2 -  11.8 FL    Nucleated RBCs 0.0 0 PER 100 WBC    nRBC 0.00 0.00 - 0.01 K/uL    Neutrophils % 80 (H) 40 - 73 %    Lymphocytes % 6 (L) 21 - 52 %    Monocytes % 13 (H) 3 - 10 %    Eosinophils % 1 0 - 5 %    Basophils % 0 0 - 2 %    Immature Granulocytes 1 (H) 0.0 - 0.5 %    Neutrophils Absolute 8.0 1.8 - 8.0 K/UL    Lymphocytes Absolute 0.6 (L) 0.9 - 3.6 K/UL    Monocytes Absolute 1.3 (H) 0.05 - 1.2 K/UL    Eosinophils Absolute 0.1 0.0 - 0.4 K/UL    Basophils Absolute 0.0 0.0 - 0.1 K/UL    Absolute Immature Granulocyte 0.1 (H) 0.00 - 0.04 K/UL    Differential Type AUTOMATED     Vancomycin Level, Random    Collection Time: 06/22/22  4:33 AM   Result Value Ref Range    Vancomycin Rm 20.3 5.0 - 40.0 UG/ML   POCT Glucose    Collection Time: 06/22/22  6:30 AM   Result Value Ref Range    POC Glucose 132 (H) 70 - 110 mg/dL         Key Findings or tests:       Telemetry NONE   Oxygen NONE     Assessment and Plan:     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   Gram negative bacteremia - anaerobic blood culture with gram negative rods. Drawn 11/17.   #1. Surgery (Drs. Kristen Loader, Eather Colas) following. Patient may require debridement possibly Monday. Decision not yet made. Continue vanc, Zosyn. ID - Dr. Judie Petit. Patel consulted. Wound care consulted. Blood cultures redrawn on 11/19.   DMT2, insulin dependent with  hyperglycemia - SSI + Lantus   Hypertension, essential - does not appear to be taking amlodipine or home antihypertensives any longer. Will monitor. Add back amlodipine if needed.   History of CVA - on ASA, statin   Hyperlipidemia - continue statin.   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 with external bone growth stimulator placement on Dec 09, 2021   Sleep apnea noncompliant with CPAP      Diet Carb reduced    DVT Prophylax ASA 325 mg; SCDs   GI Prophylaxis Famotidine    Code status Full    Disposition TBD- interested in ARU. PT/OT recs pending.  Debridement on Tuesday         Henrietta Hoover, DO, hospitalist   June 22, 2022

## 2022-06-22 NOTE — Progress Notes (Signed)
0730-Bedside and Verbal shift change report given to Duanne Moron (oncoming nurse) by Lucina Mellow (offgoing nurse). Report included the following information Nurse Handoff Report, Adult Overview, Intake/Output, MAR, Recent Results, Cardiac Rhythm ST, and Neuro Assessment. PT has home CPAP at the bedside.       44- PT medicated for elevated temp of 100.9 oral with PO tylenol.     1030- Temp recheck 99.9      1330- Pts BGL 369- 10 units humalog given. Dr. Nevada Crane paged. No new orders.     1830- Pts Bgl 331- 8 units humalog given.     1930-Bedside and Verbal shift change report given to Nicole Kindred RN (oncoming nurse) by Duanne Moron (offgoing nurse). Report included the following information Nurse Handoff Report, Adult Overview, Intake/Output, Recent Results, Cardiac Rhythm NSR- ST, and Neuro Assessment.

## 2022-06-22 NOTE — Plan of Care (Signed)
Problem: Discharge Planning  Goal: Discharge to home or other facility with appropriate resources  06/22/2022 0810 by Vanice Sarah, RN  Outcome: Progressing  06/22/2022 0140 by Prudencio Pair, RN  Outcome: Progressing  Flowsheets (Taken 06/21/2022 2130)  Discharge to home or other facility with appropriate resources:   Identify barriers to discharge with patient and caregiver   Arrange for needed discharge resources and transportation as appropriate     Problem: Pain  Goal: Verbalizes/displays adequate comfort level or baseline comfort level  06/22/2022 0810 by Vanice Sarah, RN  Outcome: Progressing  06/22/2022 0140 by Prudencio Pair, RN  Outcome: Progressing  Flowsheets (Taken 06/21/2022 2130)  Verbalizes/displays adequate comfort level or baseline comfort level:   Encourage patient to monitor pain and request assistance   Implement non-pharmacological measures as appropriate and evaluate response     Problem: Skin/Tissue Integrity  Goal: Absence of new skin breakdown  Description: 1.  Monitor for areas of redness and/or skin breakdown  2.  Assess vascular access sites hourly  3.  Every 4-6 hours minimum:  Change oxygen saturation probe site  4.  Every 4-6 hours:  If on nasal continuous positive airway pressure, respiratory therapy assess nares and determine need for appliance change or resting period.  06/22/2022 0810 by Vanice Sarah, RN  Outcome: Progressing  06/22/2022 0140 by Prudencio Pair, RN  Outcome: Progressing     Problem: Safety - Adult  Goal: Free from fall injury  06/22/2022 0810 by Vanice Sarah, RN  Outcome: Progressing  06/22/2022 0140 by Prudencio Pair, RN  Outcome: Progressing  Flowsheets (Taken 06/21/2022 2130)  Free From Fall Injury:   Instruct family/caregiver on patient safety   Based on caregiver fall risk screen, instruct family/caregiver to ask for assistance with transferring infant if caregiver noted to have fall risk factors     Problem: ABCDS Injury  Assessment  Goal: Absence of physical injury  06/22/2022 0810 by Vanice Sarah, RN  Outcome: Progressing  06/22/2022 0140 by Prudencio Pair, RN  Outcome: Progressing  Flowsheets (Taken 06/21/2022 2130)  Absence of Physical Injury: Implement safety measures based on patient assessment

## 2022-06-22 NOTE — Plan of Care (Signed)
Problem: Discharge Planning  Goal: Discharge to home or other facility with appropriate resources  06/22/2022 0140 by Prudencio Pair, RN  Outcome: Progressing  Flowsheets (Taken 06/21/2022 2130)  Discharge to home or other facility with appropriate resources:   Identify barriers to discharge with patient and caregiver   Arrange for needed discharge resources and transportation as appropriate    Outcome: Progressing     Problem: Pain  Goal: Verbalizes/displays adequate comfort level or baseline comfort level  06/22/2022 0140 by Prudencio Pair, RN  Outcome: Progressing  Flowsheets (Taken 06/21/2022 2130)  Verbalizes/displays adequate comfort level or baseline comfort level:   Encourage patient to monitor pain and request assistance   Implement non-pharmacological measures as appropriate and evaluate response  Outcome: Progressing     Problem: Skin/Tissue Integrity  Goal: Absence of new skin breakdown  Description: 1.  Monitor for areas of redness and/or skin breakdown  2.  Assess vascular access sites hourly  3.  Every 4-6 hours minimum:  Change oxygen saturation probe site  4.  Every 4-6 hours:  If on nasal continuous positive airway pressure, respiratory therapy assess nares and determine need for appliance change or resting period.  06/22/2022 0140 by Prudencio Pair, RN  Outcome: Progressing     Problem: Safety - Adult  Goal: Free from fall injury  06/22/2022 0140 by Prudencio Pair, RN  Outcome: Progressing  Flowsheets (Taken 06/21/2022 2130)  Free From Fall Injury:   Instruct family/caregiver on patient safety   Based on caregiver fall risk screen, instruct family/caregiver to ask for assistance with transferring infant if caregiver noted to have fall risk factors    Outcome: Progressing     Problem: ABCDS Injury Assessment  Goal: Absence of physical injury  06/22/2022 0140 by Prudencio Pair, RN  Outcome: Progressing  Flowsheets (Taken 06/21/2022 2130)  Absence of Physical Injury: Implement safety  measures based on patient assessment

## 2022-06-23 ENCOUNTER — Encounter: Payer: PRIVATE HEALTH INSURANCE | Primary: Physician Assistant

## 2022-06-23 LAB — TYPE AND SCREEN
ABO/Rh: B POS
Antibody Screen: NEGATIVE
Dispense Status Blood Bank: TRANSFUSED
Unit Divison: 0

## 2022-06-23 LAB — CBC WITH AUTO DIFFERENTIAL
Basophils %: 0 % (ref 0–2)
Basophils Absolute: 0 10*3/uL (ref 0.0–0.1)
Eosinophils %: 2 % (ref 0–5)
Eosinophils Absolute: 0.2 10*3/uL (ref 0.0–0.4)
Hematocrit: 27.5 % — ABNORMAL LOW (ref 35.0–45.0)
Hemoglobin: 8.5 g/dL — ABNORMAL LOW (ref 12.0–16.0)
Immature Granulocytes %: 1 % — ABNORMAL HIGH (ref 0.0–0.5)
Immature Granulocytes Absolute: 0.1 10*3/uL — ABNORMAL HIGH (ref 0.00–0.04)
Lymphocytes %: 4 % — ABNORMAL LOW (ref 21–52)
Lymphocytes Absolute: 0.4 10*3/uL — ABNORMAL LOW (ref 0.9–3.6)
MCH: 23 pg — ABNORMAL LOW (ref 24.0–34.0)
MCHC: 30.9 g/dL — ABNORMAL LOW (ref 31.0–37.0)
MCV: 74.3 FL — ABNORMAL LOW (ref 78.0–100.0)
MPV: 8.9 FL — ABNORMAL LOW (ref 9.2–11.8)
Monocytes %: 11 % — ABNORMAL HIGH (ref 3–10)
Monocytes Absolute: 1.3 10*3/uL — ABNORMAL HIGH (ref 0.05–1.2)
Neutrophils %: 82 % — ABNORMAL HIGH (ref 40–73)
Neutrophils Absolute: 9.4 10*3/uL — ABNORMAL HIGH (ref 1.8–8.0)
Nucleated RBCs: 0 /100{WBCs}
Platelets: 354 10*3/uL (ref 135–420)
RBC: 3.7 M/uL — ABNORMAL LOW (ref 4.20–5.30)
RDW: 19.7 % — ABNORMAL HIGH (ref 11.6–14.5)
WBC: 11.4 10*3/uL (ref 4.6–13.2)
nRBC: 0 10*3/uL (ref 0.00–0.01)

## 2022-06-23 LAB — BASIC METABOLIC PANEL W/ REFLEX TO MG FOR LOW K
Anion Gap: 5 mmol/L (ref 3.0–18)
BUN/Creatinine Ratio: 17 (ref 12–20)
BUN: 16 mg/dL (ref 7.0–18)
CO2: 25 mmol/L (ref 21–32)
Calcium: 10.3 mg/dL — ABNORMAL HIGH (ref 8.5–10.1)
Chloride: 111 mmol/L (ref 100–111)
Creatinine: 0.95 mg/dL (ref 0.6–1.3)
Est, Glom Filt Rate: >60 mL/min/{1.73_m2} (ref >60)
Glucose: 268 mg/dL — ABNORMAL HIGH (ref 74–99)
Potassium: 3.9 mmol/L (ref 3.5–5.5)
Sodium: 141 mmol/L (ref 136–145)

## 2022-06-23 LAB — POCT GLUCOSE
POC Glucose: 283 mg/dL — ABNORMAL HIGH (ref 70–110)
POC Glucose: 252 mg/dL — ABNORMAL HIGH (ref 70–110)
POC Glucose: 285 mg/dL — ABNORMAL HIGH (ref 70–110)
POC Glucose: 314 mg/dL — ABNORMAL HIGH (ref 70–110)

## 2022-06-23 LAB — CULTURE, BLOOD 1

## 2022-06-23 LAB — PREPARE RBC (CROSSMATCH)

## 2022-06-23 MED ORDER — SODIUM CHLORIDE 0.9 % IV SOLN
0.9 | INTRAVENOUS | Status: DC | PRN
Start: 2022-06-23 — End: 2022-07-03

## 2022-06-23 MED FILL — PIPERACILLIN SOD-TAZOBACTAM SO 3.375 (3-0.375) G IV SOLR: 3.375 (3-0.375) g | INTRAVENOUS | Qty: 3375

## 2022-06-23 MED FILL — VANCOMYCIN HCL 1 G IV SOLR: 1 g | INTRAVENOUS | Qty: 1000

## 2022-06-23 MED FILL — INSULIN LISPRO 100 UNIT/ML IJ SOLN: 100 UNIT/ML | INTRAMUSCULAR | Qty: 6

## 2022-06-23 MED FILL — BACLOFEN 10 MG PO TABS: 10 MG | ORAL | Qty: 1

## 2022-06-23 MED FILL — CULTURELLE PO CAPS: ORAL | Qty: 1

## 2022-06-23 MED FILL — INSULIN LISPRO 100 UNIT/ML IJ SOLN: 100 UNIT/ML | INTRAMUSCULAR | Qty: 8

## 2022-06-23 MED FILL — ATORVASTATIN CALCIUM 40 MG PO TABS: 40 MG | ORAL | Qty: 2

## 2022-06-23 MED FILL — ACETAMINOPHEN 325 MG PO TABS: 325 MG | ORAL | Qty: 2

## 2022-06-23 MED FILL — INSULIN GLARGINE 100 UNIT/ML SC SOLN: 100 UNIT/ML | SUBCUTANEOUS | Qty: 10

## 2022-06-23 MED FILL — FERROUS SULFATE 325 (65 FE) MG PO TABS: 325 (65 Fe) MG | ORAL | Qty: 1

## 2022-06-23 MED FILL — FAMOTIDINE 20 MG PO TABS: 20 MG | ORAL | Qty: 1

## 2022-06-23 MED FILL — INSULIN LISPRO 100 UNIT/ML IJ SOLN: 100 UNIT/ML | INTRAMUSCULAR | Qty: 4

## 2022-06-23 NOTE — Progress Notes (Signed)
Pt using home [employer]

## 2022-06-23 NOTE — Plan of Care (Signed)
Problem: Discharge Planning  Goal: Discharge to home or other facility with appropriate resources  Outcome: Progressing     Problem: Pain  Goal: Verbalizes/displays adequate comfort level or baseline comfort level  Outcome: Progressing     Problem: Skin/Tissue Integrity  Goal: Absence of new skin breakdown  Description: 1.  Monitor for areas of redness and/or skin breakdown  2.  Assess vascular access sites hourly  3.  Every 4-6 hours minimum:  Change oxygen saturation probe site  4.  Every 4-6 hours:  If on nasal continuous positive airway pressure, respiratory therapy assess nares and determine need for appliance change or resting period.  Outcome: Progressing     Problem: Safety - Adult  Goal: Free from fall injury  Outcome: Progressing     Problem: ABCDS Injury Assessment  Goal: Absence of physical injury  Outcome: Progressing

## 2022-06-23 NOTE — Telephone Encounter (Signed)
Pt daughter aware Dr Bing Ree is aware of pts current condition and will see her tomorrow morning during her rounds.

## 2022-06-23 NOTE — Progress Notes (Signed)
General surgery:    Patient added to OR schedule 06/24/2022 for incision and drainage/debridement of sacral decubitus. NPO after midnight.     Nestor Ramp, DO

## 2022-06-23 NOTE — Telephone Encounter (Signed)
Pt daughter called in wondering if home health has contacted the office yet.Patient has second infection and in the hospital. Daughter stated she has sepsice. Daughter is requesting a phone call back.

## 2022-06-23 NOTE — Progress Notes (Addendum)
Comprehensive Nutrition Assessment    Type and Reason for Visit:  Initial, Positive Nutrition Screen    Nutrition Recommendations/Plan:   Continue Current Diet.   Start Oral Nutrition Supplement- Plan to add Juven (each provides 95 kcal, 2.5g protein) BID, fruit punch per pt's request.   Continue to monitor tolerance of PO, compliance of oral supplements, weight, labs, and plan of care during admission.         Malnutrition Assessment:  Malnutrition Status:  At risk for malnutrition (Comment) (Wound) (06/23/22 1053)      Nutrition History and Allergies:   PMHx: Arthritis, DMT2, hypertension, hyperlipidemia, sleep apnea noncompliant with CPAP, stroke. Pt reports poor intake x (1) day, prior to that time period, reports good appetite. Pt reports UBW of ~154 lbs x unknown timeframe and fluctuating weight. Wt hx: 178 lbs (06/22/22), 143 lbs (12/09/21), weight gain of 35 lbs (24%) x 6 months, 154 lbs (08/29/21), weight gain of 24 lbs (15%) x 10 months, 150 lbs (05/25/21), weight gain of 28 lbs (18.6%) x >1 year. Will continue to follow weight trends. NKFA.    Nutrition Assessment:    Pt presents with complaints of increased drainage from sacral ulcer. 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 Positive nutrition screen noted, pressure injury. Pt noted for incision and drainage/debridement of sacral decubitus 11/21. Pt seen at bedside with daughter present, RN presenting later in visit. Note, limited conversation d/t medical procedure. Pt reports good appetite consuming 100% of breakfast today. (1) meal documented, pt consumed 1-25% of lunch on 06/22/22. Will add oral supplements to increase calorie/protein intake opportunity,pt agreeable. Pt requesting tropical flavored Juven only. Pt reports lactose intolerance. Will update preferences. Will continue to monitor intake.     Nutrition Education:  Educated on Nutrition Therapy for Pressure  Injuries  Learners: Patient and Family  Readiness: Acceptance  Method: Handout  Response:  Limited visit, will assess learning on follow up.  Contact name and number provided.     Nutrition Related Findings:    Last BM (including prior to admit): 06/21/22. Edema: Generalized  Edema Generalized: +1. Pertinent Meds: Lipitor, pepcid, iron 325, lantus, humalog, culturelle, zosyn, mag sulfate (prn) effer-k (prn), klor-con (prn), K Cl (prn). Pertinent Labs: Calcium 10.3 (H), POC Glucose 132-369 mg/dl x 24 hrs, A1c 8.2 (H) 06/20/22. Wound Type: Pressure Injury, Stage IV       Current Nutrition Intake & Therapies:    Average Meal Intake: 1-25%  Average Supplements Intake: None Ordered  ADULT DIET; Regular; 3 carb choices (45 gm/meal); Low Fat/Low Chol/High Fiber/2 gm Na; Low Sodium (2 gm)  Diet NPO    Anthropometric Measures:  Height: 149.9 cm (4' 11.02")  Ideal Body Weight (IBW): 95 lbs (43 kg)    Admission Body Weight: 81.1 kg (178 lb 12.7 oz)  Current Body Weight: 81.1 kg (178 lb 12.7 oz), 188.2 % IBW. Weight Source: Bed Scale  Current BMI (kg/m2): 36.1  Usual Body Weight: 69.1 kg (152 lb 5.4 oz)  % Weight Change (Calculated): 17.4  Weight Adjustment For: No Adjustment  BMI Categories: Obese Class 2 (BMI 35.0 -39.9)    Estimated Daily Nutrient Needs:  Energy Requirements Based On: Formula  Weight Used for Energy Requirements: Current  Energy (kcal/day): 1610-9604 (1-1.1)  Weight Used for Protein Requirements: Ideal  Protein (g/day): 65-86 (1.5-2)  Method Used for Fluid Requirements: 1 ml/kcal  Fluid (ml/day): 5409-8119    Nutrition Diagnosis:  Increased nutrient needs related to increase demand for energy/nutrients as evidenced by wounds    Nutrition Interventions:   Food and/or Nutrient Delivery: Continue Current Diet, Start Oral Nutrition Supplement  Nutrition Education/Counseling: Education completed  Coordination of Nutrition Care: Continue to monitor while inpatient  Plan of Care discussed with: Pt &  daughter    Goals:     Goals: Meet at least 75% of estimated needs, by next RD assessment       Nutrition Monitoring and Evaluation:   Behavioral-Environmental Outcomes: None Identified  Food/Nutrient Intake Outcomes: Food and Nutrient Intake, Supplement Intake  Physical Signs/Symptoms Outcomes: Biochemical Data, Chewing or Swallowing, Constipation, Diarrhea, GI Status, Nausea or Vomiting, Fluid Status or Edema, Hemodynamic Status, Meal Time Behavior, Skin, Weight    Discharge Planning:    Continue Oral Nutrition Supplement, Continue current diet     Lakima Dona, RD  Contact: [phone]

## 2022-06-23 NOTE — Consults (Addendum)
Infectious Disease Consultation Note        Reason:sepsis, sacral abscess/osteomyelitis, gram negative bloodstream infection    Current abx Prior abx   Zosyn, vancomycin since 06/20/22      Lines:       Assessment :  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  Plans for surgical debridement noted     Cervical spondylotic myelopathy: s/p  C3, C4, C5, C6 laminectomy; posterior cervical fusion C3, C4, C5, C6; segmental instrumentation, Stryker type, C3, C4, C5, C6 with lateral mass screws on 12/09/21        Recommendations:     Continue zosyn, vancomycin   Follow-up blood cx 11/19  Follow up surgery recommendations regarding I&D of sacrum  Follow up intra op findings, cultures and above results to make final abx decisions.       Thank you for consultation request. Above plan was discussed in details with patient, daughter at bedside and primary team. Please call me if any further questions or concerns. Will continue to participate in the care of this patient.    HPI:    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     Patient is known to me from prior inpatient consultation [facility].   Daughter reports increased drainage from wound since last week. States wound vac was taken out last week and has noticed increased drainage since then. Patient reports feeling increased fatigued.  According to the daughter patient has been taking Keflex to prevent infection of the ulcer, but has not helped in her opinion. Not clear to me as to who prescribed th keflex.        Denies any associated fever or chills.  Daughter states her mother is not getting the appropriate wound care  that she should be receiving. States next appointment with Dr Yvone NeuYeshtokin was scheduled for 07/15/2022. But she was brought to ed due to increased weakness/drainage from wound.  Denies chest pain, shortness of breath, nausea vomiting diarrhea, abdominal pain, headache, dizziness, cough.  Denies smoking drinking or illicit drug use.     In the ED, temp 102.0--> 100.2, respiration 17, heart rate 118, blood pressure 159/111, 100% on room air.  CMP without significant abnormality.  Lactic acid 2.2--> 0.8.  Pro-Cal 0.79.  Troponin negative.  Glucose 200.  Hemoglobin 8.4, hematocrit 27.8, MCV 73.4.  Blood cultures x2 obtained.  Respiratory viral panel negative.  Chest x-ray with mild pulmonary edema.  CT head with no acute intracranial abnormality.  CT abdomen and pelvis with large ill-defined fluid and gas collection in the subcutaneous fat  overlying the sacrum concerning for phlegmon or early abscess.  Possible osteomyelitis.  EKG with sinus tachycardia.  General surgery consulted by the ED.  Blood cx obtained. Started on zosyn, vancomycin. I have been consulted for further recommendations.            Past Medical History:   Diagnosis Date    Anemia     Arthritis     Chronic pain     Legs and Shoulders    Diabetes (HCC)     Exposure to asbestos     History of blood transfusion     History of colon polyps     HTN (hypertension)     Hx of blood clots     DVT     Hyperlipemia     Menopause     Pulmonary nodule     Serum calcium elevated     Sleep apnea     not using cpap    Stroke [facility])        Past Surgical History:   Procedure Laterality Date    CESAREAN SECTION  1987    COLONOSCOPY N/A 01/13/2017    COLONOSCOPY performed by Levan Hurst, MD at [facility] ENDOSCOPY    COLONOSCOPY      DILATION AND CURETTAGE OF UTERUS  1995    RECTAL SURGERY N/A 12/28/2021    SACRAL WOUND DEBRIDEMENT INCISION AND DRAINAGE performed by Phineas Semen, DO at [facility] MAIN OR    SPINE SURGERY N/A 12/09/2021    CERVICAL THREE/FOUR/FIVE/SIX LAMINECTOMY FUSION; C-ARM; STRYKER; EXT BONE GROWTH STIM; 23 HR performed by Pamalee Leyden, MD at [facility] MAIN OR       @    Current Facility-Administered Medications   Medication Dose Route Frequency    0.9 % sodium chloride infusion   IntraVENous PRN    lactobacillus (CULTURELLE) capsule 1 capsule  1 capsule Oral Daily with breakfast    vancomycin (VANCOCIN) 1,000 mg in sodium chloride 0.9 % 250 mL (vial-mate) IVPB  1,000 mg IntraVENous Q18H    sodium chloride flush 0.9 % injection 5-40 mL  5-40 mL IntraVENous 2 times per day    sodium chloride flush 0.9 % injection 5-40 mL  5-40 mL IntraVENous PRN    potassium chloride (KLOR-CON M) extended release tablet 40 mEq  40 mEq Oral PRN    Or    potassium bicarb-citric acid (EFFER-K) effervescent tablet 40 mEq  40 mEq Oral PRN    Or    potassium chloride 10 mEq/100 mL IVPB (Peripheral Line)  10 mEq IntraVENous PRN    magnesium sulfate 2000 mg in 50 mL IVPB premix  2,000 mg IntraVENous PRN    ondansetron (ZOFRAN-ODT) disintegrating tablet 4 mg  4 mg Oral Q8H PRN    Or    ondansetron (ZOFRAN) injection 4 mg  4 mg IntraVENous Q6H PRN    polyethylene glycol (GLYCOLAX) packet 17 g  17 g Oral Daily PRN    famotidine (PEPCID) tablet 20 mg  20 mg Oral Daily    acetaminophen (TYLENOL) tablet 650 mg  650 mg Oral Q6H PRN    Or    acetaminophen (TYLENOL) suppository 650 mg  650 mg Rectal Q6H PRN    insulin lispro (HUMALOG)  injection vial 0-8 Units  0-8 Units SubCUTAneous 4x Daily AC & HS    glucose chewable tablet 16 g  4 tablet Oral PRN    dextrose bolus 10% 125 mL  125 mL IntraVENous PRN    Or  dextrose bolus 10% 250 mL  250 mL IntraVENous PRN    glucagon injection 1 mg  1 mg SubCUTAneous PRN    dextrose 10 % infusion   IntraVENous Continuous PRN    piperacillin-tazobactam (ZOSYN) 3,375 mg in sodium chloride 0.9 % 50 mL IVPB (mini-bag)  3,375 mg IntraVENous Q8H    [Held by provider] heparin (porcine) injection 5,000 Units  5,000 Units SubCUTAneous 3 times per day    insulin glargine (LANTUS) injection vial 10 Units  10 Units SubCUTAneous Nightly    baclofen (LIORESAL) tablet 10 mg  10 mg Oral BID    [Held by provider] aspirin tablet 325 mg  325 mg Oral Daily    atorvastatin (LIPITOR) tablet 80 mg  80 mg Oral Nightly    ferrous sulfate (IRON 325) tablet 325 mg  325 mg Oral Daily       Allergies: Lactose    Family History   Problem Relation Age of Onset    Diabetes Other 84        parent, NOS    Cancer Father     Hypertension Other 35        parent,NOS    Heart Disease Other 26        parent, NOS    Lung Disease Father      Social History     Socioeconomic History    Marital status: Single     Spouse name: Not on file    Number of children: Not on file    Years of education: Not on file    Highest education level: Not on file   Occupational History    Not on file   Tobacco Use    Smoking status: Former     Packs/day: 1     Types: Cigarettes, Cigars     Quit date: 11/13/2021     Years since quitting: 0.6    Smokeless tobacco: Never   Vaping Use    Vaping Use: Never used   Substance and Sexual Activity    Alcohol use: Not Currently     Comment: occ    Drug use: Never    Sexual activity: Not on file   Other Topics Concern    Not on file   Social History Narrative    Not on file     Social Determinants of Health     Financial Resource Strain: Low Risk  (01/05/2022)    Overall Financial Resource Strain (CARDIA)     Difficulty of Paying  Living Expenses: Not hard at all   Food Insecurity: No Food Insecurity (01/05/2022)    Hunger Vital Sign     Worried About Running Out of Food in the Last Year: Never true     Ran Out of Food in the Last Year: Never true   Transportation Needs: No Transportation Needs (02/09/2022)    OASIS A1250: Transportation     Lack of Transportation (Medical): No     Lack of Transportation (Non-Medical): No     Patient Unable or Declines to Respond: No   Physical Activity: Inactive (01/05/2022)    Exercise Vital Sign     Days of Exercise per Week: 0 days     Minutes of Exercise per Session: 0 min   Stress: No Stress Concern Present (01/05/2022)    [employer] of Occupational Health - Occupational Stress Questionnaire     Feeling of Stress : Not at all   Social Connections: Feeling Socially Integrated (02/09/2022)  OASIS D0700: Social Isolation     Frequency of experiencing loneliness or isolation: Never   Recent Concern: Social Connections - Moderately Isolated (01/05/2022)    Social Connection and Isolation Panel [NHANES]     Frequency of Communication with Friends and Family: Three times a week     Frequency of Social Gatherings with Friends and Family: More than three times a week     Attends Religious Services: More than 4 times per year     Active Member of Clubs or Organizations: No     Attends Banker Meetings: Never     Marital Status: Widowed   Intimate Partner Violence: Not At Risk (01/05/2022)    Humiliation, Afraid, Rape, and Kick questionnaire     Fear of Current or Ex-Partner: No     Emotionally Abused: No     Physically Abused: No     Sexually Abused: No   Housing Stability: Low Risk  (01/05/2022)    Housing Stability Vital Sign     Unable to Pay for Housing in the Last Year: No     Number of Places Lived in the Last Year: 1     Unstable Housing in the Last Year: No     Social History     Tobacco Use   Smoking Status Former    Packs/day: 1    Types: Cigarettes, Cigars    Quit date: 11/13/2021    Years since  quitting: 0.6   Smokeless Tobacco Never        Temp (24hrs), Avg:99.3 F (37.4 C), Min:98.1 F (36.7 C), Max:101.9 F (38.8 C)    BP 138/60   Pulse 95   Temp 99.4 F (37.4 C) (Oral)   Resp 18   Ht 1.499 m (4' 11.02")   Wt 81.1 kg (178 lb 12.7 oz)   SpO2 96%   BMI 36.09 kg/m     ROS: 12 point ROS obtained in details. Pertinent positives as mentioned in HPI,   otherwise negative    Physical Exam:    General: NAD, appears stated age, alert, comfortable    Eyes: PERRL, sclera is non-icteric  HENT: normocephalic/atraumatic, moist mucus membranes  Respiratory: CTA with no signs of respiratory distress  Cardiovascular: S1S2 regular, no cyanosis + 1-2 BLE ankle edema  GI: soft, non-tender, normal bowel sounds  Neuro: moves all extremities, no focal deficits, normal speech  Psych: appropriate mood and affect, no visual or auditory hallucinations  Skin: stage 4 sacral ulcer with red granulation tissue- no purulence, fluctuant tender swelling proximal to sacral ulcer       Labs: Results:   Chemistry Recent Labs     06/20/22  1820 06/21/22  0338 06/22/22  0433 06/23/22  0405   GLUCOSE 200* 252* 140* 268*   NA 139 140 140 141   K 4.2 3.3* 3.7 3.9   CL 107 109 111 111   CO2 29 26 25 25    BUN 15 14 16 16    CREATININE 0.88 0.94 0.91 0.95   GLOB 4.0  --   --   --    ALT 30  --   --   --    AST 21  --   --   --       CBC w/Diff Recent Labs     06/21/22  0338 06/22/22  0433 06/23/22  0405   WBC 11.6 10.1 11.4   RBC 3.75* 3.15* 3.70*   HGB 8.3* 6.8* 8.5*  HCT 27.7* 22.8* 27.5*   PLT 302 304 354      Microbiology Results       Procedure Component Value Units Date/Time    Culture, Blood 1 [[id number]][[id number]] Collected: 06/22/22 0433    Order Status: Completed Specimen: Blood Updated: 06/23/22 0650     Special Requests NO SPECIAL REQUESTS        Culture NO GROWTH 1 DAY       Culture, Blood 2 [[id number]][[id number]] Collected: 06/22/22 0433    Order Status: Completed Specimen: Blood Updated: 06/23/22 0650     Special Requests NO SPECIAL  REQUESTS        Culture NO GROWTH 1 DAY       Culture, Wound Aerobic Only [[id number]][[id number]] Collected: 06/20/22 2230    Order Status: Canceled Specimen: Sacrum     Blood Culture 2 [[id number]] Collected: 06/20/22 1820    Order Status: Completed Specimen: Blood Updated: 06/23/22 0650     Special Requests NO SPECIAL REQUESTS        Culture NO GROWTH 3 DAYS       Respiratory Panel, Molecular, with COVID-19 (Restricted: peds pts or suitable admitted adults) [[id number]][[id number]] Collected: 06/20/22 1820    Order Status: Completed Specimen: Nasopharyngeal Updated: 06/20/22 1926     Adenovirus by PCR Not detected        Coronavirus 229E by PCR Not detected        Coronavirus HKU1 by PCR Not detected        Coronavirus NL63 by PCR Not detected        Coronavirus OC43 by PCR Not detected        SARS-CoV-2, PCR Not detected        Human Metapneumovirus by PCR Not detected        Rhinovirus Enterovirus PCR Not detected        Influenza A by PCR Not detected        Influenza B PCR Not detected        Parainfluenza 1 PCR Not detected        Parainfluenza 2 PCR Not detected        Parainfluenza 3 PCR Not detected        Parainfluenza 4 PCR Not detected        Respiratory Syncytial Virus by PCR Not detected        Bordetella parapertussis by PCR Not detected        Bordetella pertussis by PCR Not detected        Chlamydophila Pneumonia PCR Not detected        Mycoplasma pneumo by PCR Not detected       Culture, Blood, PCR ID Panel [[id number]][[id number]]  (Abnormal) Collected: 06/20/22 1820    Order Status: Completed Specimen: Blood Updated: 06/21/22 1848     Accession Number [id number]     Enterococcus faecalis by PCR Not detected        Enterococcus faecium by PCR Not detected        Listeria monocytogenes by PCR Not detected        STAPHYLOCOCCUS Not detected        Staphylococcus Aureus Not detected        Staphylococcus epidermidis by PCR Not detected        Staphylococcus lugdunensis by PCR Not detected        STREPTOCOCCUS Not detected         Streptococcus agalactiae (Group B) Not detected        Strep pneumoniae  Not detected        Strep pyogenes,(Grp. A) Not detected        Acinetobacter calcoac baumannii complex by PCR Not detected        Bacteroides fragilis by PCR Detected        Enterobacteriaceae by PCR Not detected        Enterobacter cloacae complex by PCR Not detected        Escherichia Coli Not detected        Klebsiella aerogenes by PCR Not detected        Klebsiella oxytoca by PCR Not detected        Klebsiella pneumoniae group by PCR Not detected        Proteus by PCR Not detected        Salmonella species by PCR Not detected        Serratia marcescens by PCR Not detected        Haemophilus Influenzae by PCR Not detected        Neisseria meningitidis by PCR Not detected        Pseudomonas aeruginosa Not detected        Stenotrophomonas maltophilia by PCR Not detected        Candida albicans by PCR Not detected        Candida auris by PCR Not detected        Candida glabrata Not detected        Candida krusei by PCR Not detected        Candida parapsilosis by PCR Not detected        Candida tropicalis by PCR Not detected        Cryptococcus neoformans/gattii by PCR Not detected        Resistant gene targets          [employer] test comment       False positive results may rarely occur. Correlate with clinical,epidemiologic, and other laboratory findings           Comment: Please see BCID Interpretation Guide in EPIC Links       Blood Culture 1 [[id number]]  (Abnormal) Collected: 06/20/22 1800    Order Status: Completed Specimen: Blood Updated: 06/23/22 1031     Special Requests NO SPECIAL REQUESTS        Gram stain       ANAEROBIC BOTTLE Gram negative rods                  SMEAR CALLED TO AND CORRECTLY REPEATED BY: RN W BARKER SVTSD ON 18NOV23 AT 1836 HRS TO 1396           Culture       Bacteroides fragilis BETA LACTAMASE POSITIVE GROWING IN 1 OF 2 BOTTLES DRAWN SITE=RFA                      RADIOLOGY:    All available imaging studies/reports  in connect care for this admission were reviewed      Disclaimer: Sections of this note are dictated utilizing voice recognition software, which may have resulted in some phonetic based errors in grammar and contents. Even though attempts were made to correct all the mistakes, some may have been missed, and remained in the body of the document. If questions arise, please contact our department.    Dr. Tommie Ard, Infectious Disease Specialist  [phone]  June 23, 2022  2:49 PM

## 2022-06-23 NOTE — Progress Notes (Addendum)
2000: Daughter at bedside.    2142:  Blood transfusion started.     2200: No adverse reaction to transfusion. Patient turned to left side.    0000: Transfusion finished. No adverse reaction. Patient turned to right side. Dressing to sacrum changed.    0200: Patient refused to be turned.    0400: Patient refused to be turned.    2505: Bedside and Verbal shift change report given to Precious, RN (On-coming nurse) by Lawernce Pitts (Off-going nurse). Report included the following information Nurse Handoff Report, Adult Overview, Intake/Output, MAR, Recent Results, Cardiac Rhythm SR-ST, and Neuro Assessment. Home CPAP is at bedside.     Wound Prevention Checklist    Patient: [name] Short (66 y.o. female)  Date: 06/23/2022  Diagnosis: Septicemia ([place]) [A41.9]  Sepsis ([place]) [A41.9] Severe sepsis ([place])    Precautions:         []   Heel prevention boots placed on patient    [x]   Patient turned q2h during shift    []   Lift team ordered    []   Patient on Stryker bed/Specialty bed    [x]   Each Wound is documented during shift (Stage, Color, drainage, odor, measurements, and dressings)    [x]   Dual skin check done with Precious, RN    Harless Litten, RN

## 2022-06-23 NOTE — Progress Notes (Signed)
[facility] Pharmacokinetic Monitoring Service - Vancomycin    Indication: Sepsis of Unknown Etiology  Target Concentration: Goal AUC/MIC 400-600 mg*hr/L  Day of Therapy: 3  Additional Antimicrobials: Piperacillin/Tazobactam    Pertinent Laboratory Values:   Temp: 98.6 F (37 C), Weight - Scale: 81.1 kg (178 lb 12.7 oz)  Recent Labs     06/20/22  1820 06/21/22  0338 06/22/22  0433 06/23/22  0405   CREATININE 0.88   < > 0.91 0.95   BUN 15   < > 16 16   WBC 11.3   < > 10.1 11.4   PROCAL 0.79  --   --   --     < > = values in this interval not displayed.       Estimated Creatinine Clearance: 58 mL/min (based on SCr of 0.95 mg/dL).    Pertinent Cultures:  Culture Date Source Results   11/17 blood ngtd   MRSA Nasal Swab: N/A. Non-respiratory infection    Assessment:  Date/Time Current Dose Concentration Timing of Concentration (h) AUC   11/18 1500mg  x1  750mg  x1  1000mg  q18h - - -   11/19 1000mg  q18h 20.3 3.75 461     Note: Serum concentrations collected for AUC dosing may appear elevated if collected in close proximity to the dose administered, this is not necessarily an indication of toxicity    Plan:  Current dosing regimen is therapeutic  Continue vancomycin 1000mg  q18h  Renal labs as indicated   Vancomycin concentration ordered with AM labs  Pharmacy will continue to monitor patient and adjust therapy as indicated    Thank you for the consult,  Granville Lewis, [facility]  06/23/2022

## 2022-06-23 NOTE — Progress Notes (Signed)
Progress Note  Hospitalist Service    Patient: [name] Short MRN: [id number]   SSN: [id number]  Date of Birth: 1956-04-20   Age: 66 y.o.  Sex: female      Admit Date: 06/20/2022    LOS: 3 days   Chief Complaint   Patient presents with    Fatigue       Subjective:     Patient seen and examined.  Daughter at bedside.  Febrile this Afternoon.  Pain is buttocks is well managed.     Objective:     Vitals:  BP (!) 127/59   Pulse 87   Temp 98.6 F (37 C) (Oral)   Resp 20   Ht 1.499 m (4' 11.02")   Wt 81.1 kg (178 lb 12.7 oz)   SpO2 97%   BMI 36.09 kg/m     Physical Exam:   General appearance: alert, appears stated age, and cooperative  Lungs: clear to auscultation bilaterally  Heart: regular rate and rhythm, S1, S2 normal, no murmur, click, rub or gallop  Abdomen: soft, non-tender.   Buttocks - wound recently bandaged; exam deferred for today. Pictures reviewed.   Pulses: 2+ and symmetric  Skin: Skin color, texture, turgor normal. No rashes or lesions  Neuro:  normal without focal findings, mental status, speech normal, alert and oriented x3, PERLA, and reflexes normal and symmetric    Intake and Output:  Current Shift: No intake/output data recorded.  Last three shifts: 11/19 0701 - 11/20 1900  In: 1023.3 [P.O.:240]  Out: 1050 [Urine:1050]    Lab/Data Review:  Recent Results (from the past 12 hour(s))   POCT Glucose    Collection Time: 06/23/22 11:21 AM   Result Value Ref Range    POC Glucose 285 (H) 70 - 110 mg/dL   POCT Glucose    Collection Time: 06/23/22  3:16 PM   Result Value Ref Range    POC Glucose 314 (H) 70 - 110 mg/dL         Key Findings or tests:       Telemetry NONE   Oxygen NONE     Assessment and Plan:     Severe Sepsis 2/2 sacral abscess with osteomyelitis - tachycardia, febrile, lactic acid 2.2--0.8, procal 0.79.   Gram negative bacteremia - anaerobic blood culture with gram negative rods. Drawn 11/17.   #1. Surgery (Drs. Forest Gleason, Criselda Peaches) following. Patient will undergo I&D on Nov 21 with Dr.  Bing Ree.  Continue vanc, Zosyn. ID - Dr. Jerilynn Mages. Patel consulted. Wound care consulted. Blood cultures redrawn on 11/19.   DMT2, insulin dependent with hyperglycemia - SSI + Lantus   Hypertension, essential - does not appear to be taking amlodipine or home antihypertensives any longer. Will monitor. Add back amlodipine if needed.   History of CVA - on ASA, statin   Hyperlipidemia - continue statin.   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 with external bone growth stimulator placement on Dec 09, 2021   Sleep apnea noncompliant with CPAP      Diet Carb reduced    DVT Prophylax ASA 325 mg; SCDs   GI Prophylaxis Famotidine    Code status Full    Disposition TBD- interested in ARU. PT/OT recs pending.  Debridement on Tuesday         Braylee Lal N Chellsie Gomer, DO, hospitalist   June 23, 2022

## 2022-06-23 NOTE — Progress Notes (Signed)
Patient's POC glucose is 350. Patient given scheduled Lantus 10 units @ 2056 and 10 units humalog @ 2109 per sliding scale. Dr. Shearon Stalls (night hospitalist) notified, No new order received.

## 2022-06-24 LAB — POCT GLUCOSE
POC Glucose: 350 mg/dL — ABNORMAL HIGH (ref 70–110)
POC Glucose: 399 mg/dL — ABNORMAL HIGH (ref 70–110)
POC Glucose: 277 mg/dL — ABNORMAL HIGH (ref 70–110)
POC Glucose: 222 mg/dL — ABNORMAL HIGH (ref 70–110)
POC Glucose: 184 mg/dL — ABNORMAL HIGH (ref 70–110)

## 2022-06-24 LAB — VANCOMYCIN LEVEL, RANDOM: Vancomycin Rm: 12.3 ug/mL (ref 5.0–40.0)

## 2022-06-24 MED ORDER — PROPOFOL 200 MG/20ML IV EMUL
200 MG/20ML | INTRAVENOUS | Status: DC | PRN
Start: 2022-06-24 — End: 2022-06-24
  Administered 2022-06-24: 22:00:00 150 via INTRAVENOUS

## 2022-06-24 MED ORDER — ONDANSETRON HCL 4 MG/2ML IJ SOLN
4 MG/2ML | INTRAMUSCULAR | Status: DC | PRN
Start: 2022-06-24 — End: 2022-06-24
  Administered 2022-06-24: 22:00:00 4 via INTRAVENOUS

## 2022-06-24 MED ORDER — LACTATED RINGERS IV SOLN
INTRAVENOUS | Status: DC
Start: 2022-06-24 — End: 2022-06-28
  Administered 2022-06-24 – 2022-06-28 (×5): via INTRAVENOUS

## 2022-06-24 MED ORDER — ROCURONIUM BROMIDE 50 MG/5ML IV SOLN
50 MG/5ML | INTRAVENOUS | Status: DC | PRN
Start: 2022-06-24 — End: 2022-06-24
  Administered 2022-06-24: 22:00:00 5 via INTRAVENOUS

## 2022-06-24 MED ORDER — DEXAMETHASONE SODIUM PHOSPHATE 4 MG/ML IJ SOLN
4 MG/ML | INTRAMUSCULAR | Status: AC
Start: 2022-06-24 — End: ?

## 2022-06-24 MED ORDER — LIDOCAINE HCL (PF) 2 % IJ SOLN
2 % | INTRAMUSCULAR | Status: AC
Start: 2022-06-24 — End: ?

## 2022-06-24 MED ORDER — SUCCINYLCHOLINE CHLORIDE 100 MG/5ML IV SOSY
100 MG/5ML | INTRAVENOUS | Status: AC
Start: 2022-06-24 — End: ?

## 2022-06-24 MED ORDER — PROCHLORPERAZINE EDISYLATE 10 MG/2ML IJ SOLN
10 MG/2ML | Freq: Once | INTRAMUSCULAR | Status: DC | PRN
Start: 2022-06-24 — End: 2022-06-24

## 2022-06-24 MED ORDER — ONDANSETRON HCL 4 MG/2ML IJ SOLN
4 MG/2ML | INTRAMUSCULAR | Status: AC
Start: 2022-06-24 — End: ?

## 2022-06-24 MED ORDER — LACTATED RINGERS IV SOLN
INTRAVENOUS | Status: DC
Start: 2022-06-24 — End: 2022-06-28

## 2022-06-24 MED ORDER — LIDOCAINE HCL (PF) 2 % IJ SOLN
2 % | INTRAMUSCULAR | Status: DC | PRN
Start: 2022-06-24 — End: 2022-06-24
  Administered 2022-06-24: 22:00:00 60 via INTRAVENOUS

## 2022-06-24 MED ORDER — PROPOFOL 200 MG/20ML IV EMUL
200 MG/20ML | INTRAVENOUS | Status: AC
Start: 2022-06-24 — End: ?

## 2022-06-24 MED ORDER — FENTANYL CITRATE (PF) 100 MCG/2ML IJ SOLN
100 MCG/2ML | INTRAMUSCULAR | Status: AC
Start: 2022-06-24 — End: ?

## 2022-06-24 MED ORDER — ONDANSETRON HCL 4 MG/2ML IJ SOLN
4 MG/2ML | Freq: Once | INTRAMUSCULAR | Status: DC | PRN
Start: 2022-06-24 — End: 2022-06-24

## 2022-06-24 MED ORDER — FENTANYL CITRATE (PF) 100 MCG/2ML IJ SOLN
100 MCG/2ML | INTRAMUSCULAR | Status: DC | PRN
Start: 2022-06-24 — End: 2022-06-24
  Administered 2022-06-24 (×2): 25 via INTRAVENOUS
  Administered 2022-06-24: 22:00:00 50 via INTRAVENOUS

## 2022-06-24 MED ORDER — SUCCINYLCHOLINE CHLORIDE 100 MG/5ML IV SOSY
100 MG/5ML | INTRAVENOUS | Status: DC | PRN
Start: 2022-06-24 — End: 2022-06-24
  Administered 2022-06-24: 22:00:00 100 via INTRAVENOUS

## 2022-06-24 MED ORDER — ROCURONIUM BROMIDE 50 MG/5ML IV SOLN
50 MG/5ML | INTRAVENOUS | Status: AC
Start: 2022-06-24 — End: ?

## 2022-06-24 MED ORDER — DEXAMETHASONE SODIUM PHOSPHATE 4 MG/ML IJ SOLN
4 MG/ML | INTRAMUSCULAR | Status: DC | PRN
Start: 2022-06-24 — End: 2022-06-24
  Administered 2022-06-24: 22:00:00 4 via INTRAVENOUS

## 2022-06-24 MED ORDER — HYDROMORPHONE 0.5MG/0.5ML IJ SOLN
1 MG/ML | Status: DC | PRN
Start: 2022-06-24 — End: 2022-06-24

## 2022-06-24 MED ORDER — FENTANYL CITRATE (PF) 100 MCG/2ML IJ SOLN
100 | INTRAMUSCULAR | Status: DC | PRN
Start: 2022-06-24 — End: 2022-06-24

## 2022-06-24 MED FILL — ONDANSETRON HCL 4 MG/2ML IJ SOLN: 4 MG/2ML | INTRAMUSCULAR | Qty: 2

## 2022-06-24 MED FILL — PIPERACILLIN SOD-TAZOBACTAM SO 3.375 (3-0.375) G IV SOLR: 3.375 (3-0.375) g | INTRAVENOUS | Qty: 3375

## 2022-06-24 MED FILL — LACTATED RINGERS IV SOLN: INTRAVENOUS | Qty: 1000

## 2022-06-24 MED FILL — CULTURELLE PO CAPS: ORAL | Qty: 1

## 2022-06-24 MED FILL — INSULIN LISPRO 100 UNIT/ML IJ SOLN: 100 UNIT/ML | INTRAMUSCULAR | Qty: 6

## 2022-06-24 MED FILL — VANCOMYCIN HCL 1 G IV SOLR: 1 g | INTRAVENOUS | Qty: 1000

## 2022-06-24 MED FILL — FENTANYL CITRATE (PF) 100 MCG/2ML IJ SOLN: 100 MCG/2ML | INTRAMUSCULAR | Qty: 2

## 2022-06-24 MED FILL — DIPRIVAN 200 MG/20ML IV EMUL: 200 MG/20ML | INTRAVENOUS | Qty: 20

## 2022-06-24 MED FILL — INSULIN LISPRO 100 UNIT/ML IJ SOLN: 100 UNIT/ML | INTRAMUSCULAR | Qty: 8

## 2022-06-24 MED FILL — INSULIN GLARGINE 100 UNIT/ML SC SOLN: 100 UNIT/ML | SUBCUTANEOUS | Qty: 10

## 2022-06-24 MED FILL — BACLOFEN 10 MG PO TABS: 10 MG | ORAL | Qty: 1

## 2022-06-24 MED FILL — FAMOTIDINE 20 MG PO TABS: 20 MG | ORAL | Qty: 1

## 2022-06-24 MED FILL — XYLOCAINE-MPF 2 % IJ SOLN: 2 % | INTRAMUSCULAR | Qty: 5

## 2022-06-24 MED FILL — DEXAMETHASONE SODIUM PHOSPHATE 4 MG/ML IJ SOLN: 4 MG/ML | INTRAMUSCULAR | Qty: 1

## 2022-06-24 MED FILL — SUCCINYLCHOLINE CHLORIDE 100 MG/5ML IV SOSY: 100 MG/5ML | INTRAVENOUS | Qty: 5

## 2022-06-24 MED FILL — ATORVASTATIN CALCIUM 40 MG PO TABS: 40 MG | ORAL | Qty: 2

## 2022-06-24 MED FILL — ROCURONIUM BROMIDE 50 MG/5ML IV SOLN: 50 MG/5ML | INTRAVENOUS | Qty: 5

## 2022-06-24 MED FILL — FERROUS SULFATE 325 (65 FE) MG PO TABS: 325 (65 Fe) MG | ORAL | Qty: 1

## 2022-06-24 NOTE — Plan of Care (Signed)
Problem: Occupational Therapy - Adult  Goal: By Discharge: Performs self-care activities at highest level of function for planned discharge setting.  See evaluation for individualized goals.  Description: Occupational Therapy Goals:  Initiated 06/21/2022 to be met within 7-10 days.    1.  Patient will perform bed mobility for ADLs and repositioning with minimal assistance/contact guard assist.   2.  Patient will perform upper body dressing with supervision/set-up.  3.  Patient will perform functional activity at EOB with supervision/set-up.  4.  Patient will perform bathing with moderate assistance .  5.  Patient will participate in upper extremity therapeutic exercise/activities with supervision/set-up for 8 minutes to improve endurance and UB strength needed for ADLs    6.  Patient will utilize energy conservation techniques during functional activities with verbal cues.    PLOF: Pt lives with daughter, reports recently has been working with HHPT on walking, was [facility] from [facility] due to being able to complete her ADLs with set-up, daughter and PCA assisting mostly with IADLs. PCA comes in intermittently during the week, daughter is pt's primary caregiver. Per pt and daughter since 1 week ago pt requires significant amount of assist for functional mobility.   Outcome: Progressing   OCCUPATIONAL THERAPY TREATMENT    Patient: [name] Short (66 y.o. female)  Date: 06/24/2022  Diagnosis: Septicemia ([place]) [A41.9]  Sepsis ([place]) [A41.9] Severe sepsis ([place])  Procedure(s) (LRB):  DEBRIDEMENT SACRAL DECUBITUS (N/A)    Precautions: Fall Risk    Chart, occupational therapy assessment, plan of care, and goals were reviewed.  ASSESSMENT:  Pt seen at bed for CHG bath in prep for scheduled surgery today. Pt c/o BLE pain w/minimal touch/movement. Pt unable to sit EOB for activity 2/2 c/o pain. Pt reacquire MAX A w/LE bathing ADL. Pt demonstrates good ROM BUE and encouraged to perform UE Therex throughout the day. Pt left in R side  lying at end of session.     Progression toward goals:  _0           Improving appropriately and progressing toward goals  _1           Improving slowly and progressing toward goals  _2           Not making progress toward goals and plan of care will be adjusted     PLAN:  Patient continues to benefit from skilled intervention to address the above impairments.  Continue treatment per established plan of care.    Further Equipment Recommendations for Discharge:  TBD by next level of care    AMPAC: AM-PAC Inpatient Daily Activity Raw Score: 13    Current research shows that an AM-PAC score of 17 or less is not associated with a discharge to the patient's home setting. Based on an AM-PAC score and their current ADL deficits; it is recommended that the patient have 3-5 sessions per week of Occupational Therapy at d/c to increase the patient's independence.      This AMPAC score should be considered in conjunction with interdisciplinary team recommendations to determine the most appropriate discharge setting. Patient's social support, diagnosis, medical stability, and prior level of function should also be taken into consideration.     SUBJECTIVE:   Patient stated, "Everything was fine until I got this infection. I was just two tiny little holes."    OBJECTIVE DATA SUMMARY:   Cognitive/Behavioral Status:  Orientation  Orientation Level: Oriented X4    Functional Mobility and Transfers for ADLs:   Bed Mobility:  Bed Mobility Training  Rolling: Moderate assistance    ADL Intervention:  UE Bathing: Moderate assistance    LE Bathing: Maximum assistance    UE Dressing: Minimal assistance  Donning/doffing hospital gown at bed level    UE Therapeutic Exercises:   AROM BUE all planes at bed level    Pain:  Pain level pre-treatment: 0/10   Pain level post-treatment: 0/10  Pt c/o BLE pain w/minimal touch/movement 2/2 muscle spasms    Activity Tolerance:    Fair 2/2 c/o pain    After treatment:   _0   Patient left in no apparent  distress sitting up in chair  _1   Patient left in no apparent distress in bed  _2   Call bell left within reach  _3   Nursing notified  _4   Caregiver present  _5   Bed alarm activated    COMMUNICATION/EDUCATION:   Patient Education  Education Given To: Patient  Education Provided: Role of Therapy;Plan of Catering manager;ADL Adaptive Strategies  Education Method: Verbal;Teach Back  Barriers to Learning: None  Education Outcome: Continued education needed    Thank you for this referral.  Javier Gell, OTA  Minutes: 24

## 2022-06-24 NOTE — Progress Notes (Addendum)
[place] Pharmacokinetic Monitoring Service - Vancomycin    Indication: Sepsis of Unknown Etiology  Target Concentration: Goal AUC/MIC 400-600 mg*hr/L  Day of Therapy: 4  Additional Antimicrobials: Piperacillin/Tazobactam    Pertinent Laboratory Values:   Temp: 99.4 F (37.4 C), Weight - Scale: 81.1 kg (178 lb 12.7 oz)  Recent Labs     06/22/22  0433 06/23/22  0405   CREATININE 0.91 0.95   BUN 16 16   WBC 10.1 11.4       Estimated Creatinine Clearance: 58 mL/min (based on SCr of 0.95 mg/dL).    Pertinent Cultures:  Culture Date Source Results   11/17 blood ngtd   MRSA Nasal Swab: N/A. Non-respiratory infection    Assessment:  Date/Time Current Dose Concentration Timing of Concentration (h) AUC   11/18 1500mg  x1  750mg  x1 - - -   11/19 1000mg  q18h 20.3 3.75 461   11/20 1000 mg q18h - - -   11/21 1000 mg q18h  12.3 13.5 477     Note: Serum concentrations collected for AUC dosing may appear elevated if collected in close proximity to the dose administered, this is not necessarily an indication of toxicity    Plan:  Current dosing regimen is therapeutic  Continue vancomycin 1000mg  q18h  BMP for tomorrow AM   Vancomycin concentration ordered with AM labs  Pharmacy will continue to monitor patient and adjust therapy as indicated    Thank you for the consult,  Mellissa Kohut, [facility]  06/24/2022

## 2022-06-24 NOTE — Progress Notes (Signed)
Infectious Disease progress Note        Reason:sepsis, sacral abscess/osteomyelitis, gram negative bloodstream infection    Current abx Prior abx   Zosyn, vancomycin since 06/20/22      Lines:       Assessment :  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  Plans for surgical debridement noted     Cervical spondylotic myelopathy: s/p  C3, C4, C5, C6 laminectomy; posterior cervical fusion C3, C4, C5, C6; segmental instrumentation, Stryker type, C3, C4, C5, C6 with lateral mass screws on 12/09/21        Recommendations:     Continue zosyn, vancomycin   Follow up surgery recommendations regarding I&D of sacrum  Follow up intra op findings, cultures and above results to make final abx decisions.            Above plan was discussed in details with patient, daughter at bedside and primary team. Please call me if any further questions or concerns. Will continue to participate in the care of this patient.    HPI:    No new complaints      Past Medical History:   Diagnosis Date    Anemia     Arthritis     Chronic pain     Legs and Shoulders    Diabetes (HCC)     Exposure to asbestos     History of blood transfusion     History of colon polyps     HTN (hypertension)     Hx of blood clots      DVT    Hyperlipemia     Menopause     Pulmonary nodule     Serum calcium elevated     Sleep apnea     not using cpap    Stroke [facility])        Past Surgical History:   Procedure Laterality Date    CESAREAN SECTION  1987    COLONOSCOPY N/A 01/13/2017    COLONOSCOPY performed by Levan Hurst, MD at [facility] ENDOSCOPY    COLONOSCOPY      DILATION AND CURETTAGE OF UTERUS  1995    RECTAL SURGERY N/A 12/28/2021    SACRAL WOUND DEBRIDEMENT INCISION AND DRAINAGE performed by Phineas Semen, DO at [facility] MAIN OR    SPINE SURGERY N/A 12/09/2021    CERVICAL THREE/FOUR/FIVE/SIX LAMINECTOMY FUSION; C-ARM; STRYKER; EXT BONE GROWTH STIM; 23 HR performed by Pamalee Leyden, MD at [facility] MAIN OR       @    Current Facility-Administered Medications   Medication Dose Route Frequency    lactated ringers IV soln infusion   IntraVENous Continuous    0.9 % sodium chloride infusion   IntraVENous PRN    lactobacillus (CULTURELLE) capsule 1 capsule  1 capsule Oral  Daily with breakfast    vancomycin (VANCOCIN) 1,000 mg in sodium chloride 0.9 % 250 mL (vial-mate) IVPB  1,000 mg IntraVENous Q18H    sodium chloride flush 0.9 % injection 5-40 mL  5-40 mL IntraVENous 2 times per day    sodium chloride flush 0.9 % injection 5-40 mL  5-40 mL IntraVENous PRN    potassium chloride (KLOR-CON M) extended release tablet 40 mEq  40 mEq Oral PRN    Or    potassium bicarb-citric acid (EFFER-K) effervescent tablet 40 mEq  40 mEq Oral PRN    Or    potassium chloride 10 mEq/100 mL IVPB (Peripheral Line)  10 mEq IntraVENous PRN    magnesium sulfate 2000 mg in 50 mL IVPB premix  2,000 mg IntraVENous PRN    ondansetron (ZOFRAN-ODT) disintegrating tablet 4 mg  4 mg Oral Q8H PRN    Or    ondansetron (ZOFRAN) injection 4 mg  4 mg IntraVENous Q6H PRN    polyethylene glycol (GLYCOLAX) packet 17 g  17 g Oral Daily PRN    famotidine (PEPCID) tablet 20 mg  20 mg Oral Daily    acetaminophen (TYLENOL) tablet 650 mg  650 mg Oral Q6H PRN    Or    acetaminophen (TYLENOL)  suppository 650 mg  650 mg Rectal Q6H PRN    insulin lispro (HUMALOG) injection vial 0-8 Units  0-8 Units SubCUTAneous 4x Daily AC & HS    glucose chewable tablet 16 g  4 tablet Oral PRN    dextrose bolus 10% 125 mL  125 mL IntraVENous PRN    Or    dextrose bolus 10% 250 mL  250 mL IntraVENous PRN    glucagon injection 1 mg  1 mg SubCUTAneous PRN    dextrose 10 % infusion   IntraVENous Continuous PRN    piperacillin-tazobactam (ZOSYN) 3,375 mg in sodium chloride 0.9 % 50 mL IVPB (mini-bag)  3,375 mg IntraVENous Q8H    [Held by provider] heparin (porcine) injection 5,000 Units  5,000 Units SubCUTAneous 3 times per day    insulin glargine (LANTUS) injection vial 10 Units  10 Units SubCUTAneous Nightly    baclofen (LIORESAL) tablet 10 mg  10 mg Oral BID    [Held by provider] aspirin tablet 325 mg  325 mg Oral Daily    atorvastatin (LIPITOR) tablet 80 mg  80 mg Oral Nightly    ferrous sulfate (IRON 325) tablet 325 mg  325 mg Oral Daily       Allergies: Lactose    Family History   Problem Relation Age of Onset    Diabetes Other 68        parent, NOS    Cancer Father     Hypertension Other 35        parent,NOS    Heart Disease Other 33        parent, NOS    Lung Disease Father      Social History     Socioeconomic History    Marital status: Single     Spouse name: Not on file    Number of children: Not on file    Years of education: Not on file    Highest education level: Not on file   Occupational History    Not on file   Tobacco Use    Smoking status: Former     Packs/day: 1     Types: Cigarettes, Cigars     Quit date: 11/13/2021  Years since quitting: 0.6    Smokeless tobacco: Never   Vaping Use    Vaping Use: Never used   Substance and Sexual Activity    Alcohol use: Not Currently     Comment: occ    Drug use: Never    Sexual activity: Not on file   Other Topics Concern    Not on file   Social History Narrative    Not on file     Social Determinants of Health     Financial Resource Strain: Low Risk  (01/05/2022)     Overall Financial Resource Strain (CARDIA)     Difficulty of Paying Living Expenses: Not hard at all   Food Insecurity: No Food Insecurity (01/05/2022)    Hunger Vital Sign     Worried About Running Out of Food in the Last Year: Never true     Ran Out of Food in the Last Year: Never true   Transportation Needs: No Transportation Needs (02/09/2022)    OASIS A1250: Transportation     Lack of Transportation (Medical): No     Lack of Transportation (Non-Medical): No     Patient Unable or Declines to Respond: No   Physical Activity: Inactive (01/05/2022)    Exercise Vital Sign     Days of Exercise per Week: 0 days     Minutes of Exercise per Session: 0 min   Stress: No Stress Concern Present (01/05/2022)    [place]     Feeling of Stress : Not at all   Social Connections: Feeling Socially Integrated (02/09/2022)    OASIS D0700: Social Isolation     Frequency of experiencing loneliness or isolation: Never   Recent Concern: Social Connections - Moderately Isolated (01/05/2022)    Social Connection and Isolation Panel [NHANES]     Frequency of Communication with Friends and Family: Three times a week     Frequency of Social Gatherings with Friends and Family: More than three times a week     Attends Religious Services: More than 4 times per year     Active Member of Clubs or Organizations: No     Attends Archivist Meetings: Never     Marital Status: Widowed   Intimate Partner Violence: Not At Risk (01/05/2022)    Humiliation, Afraid, Rape, and Kick questionnaire     Fear of Current or Ex-Partner: No     Emotionally Abused: No     Physically Abused: No     Sexually Abused: No   Housing Stability: Low Risk  (01/05/2022)    Housing Stability Vital Sign     Unable to Pay for Housing in the Last Year: No     Number of Places Lived in the Last Year: 1     Unstable Housing in the Last Year: No     Social History     Tobacco Use   Smoking Status Former    Packs/day: 1     Types: Cigarettes, Cigars    Quit date: 11/13/2021    Years since quitting: 0.6   Smokeless Tobacco Never        Temp (24hrs), Avg:98.6 F (37 C), Min:98.1 F (36.7 C), Max:99.4 F (37.4 C)    BP 130/69   Pulse 72   Temp 98.1 F (36.7 C) (Oral)   Resp 18   Ht 1.499 m (4' 11.02")   Wt 81.1 kg (178 lb 12.7 oz)   SpO2 96%  BMI 36.09 kg/m     ROS: 12 point ROS obtained in details. Pertinent positives as mentioned in HPI,   otherwise negative    Physical Exam:    General: NAD, appears stated age, alert, comfortable    Eyes: PERRL, sclera is non-icteric  HENT: normocephalic/atraumatic, moist mucus membranes  Respiratory: CTA with no signs of respiratory distress  Cardiovascular: S1S2 regular, no cyanosis + 1-2 BLE ankle edema  GI: soft, non-tender, normal bowel sounds  Neuro: moves all extremities, no focal deficits, normal speech  Psych: appropriate mood and affect, no visual or auditory hallucinations  Skin: stage 4 sacral ulcer with red granulation tissue- no purulence, fluctuant tender swelling proximal to sacral ulcer       Labs: Results:   Chemistry Recent Labs     06/22/22  0433 06/23/22  0405   GLUCOSE 140* 268*   NA 140 141   K 3.7 3.9   CL 111 111   CO2 25 25   BUN 16 16   CREATININE 0.91 0.95        CBC w/Diff Recent Labs     06/22/22  0433 06/23/22  0405   WBC 10.1 11.4   RBC 3.15* 3.70*   HGB 6.8* 8.5*   HCT 22.8* 27.5*   PLT 304 354        Microbiology Results       Procedure Component Value Units Date/Time    Culture, Blood 1 [[id number]] Collected: 06/22/22 0433    Order Status: Completed Specimen: Blood Updated: 06/24/22 0643     Special Requests NO SPECIAL REQUESTS        Culture NO GROWTH 2 DAYS       Culture, Blood 2 [[id number]] Collected: 06/22/22 0433    Order Status: Completed Specimen: Blood Updated: 06/24/22 0643     Special Requests NO SPECIAL REQUESTS        Culture NO GROWTH 2 DAYS       Culture, Wound Aerobic Only [[id number]] Collected: 06/20/22 2230    Order Status: Canceled  Specimen: Sacrum     Blood Culture 2 [[id number]] Collected: 06/20/22 1820    Order Status: Completed Specimen: Blood Updated: 06/24/22 0643     Special Requests NO SPECIAL REQUESTS        Culture NO GROWTH 4 DAYS       Respiratory Panel, Molecular, with COVID-19 (Restricted: peds pts or suitable admitted adults) [[id number]] Collected: 06/20/22 1820    Order Status: Completed Specimen: Nasopharyngeal Updated: 06/20/22 1926     Adenovirus by PCR Not detected        Coronavirus 229E by PCR Not detected        Coronavirus HKU1 by PCR Not detected        Coronavirus NL63 by PCR Not detected        Coronavirus OC43 by PCR Not detected        SARS-CoV-2, PCR Not detected        Human Metapneumovirus by PCR Not detected        Rhinovirus Enterovirus PCR Not detected        Influenza A by PCR Not detected        Influenza B PCR Not detected        Parainfluenza 1 PCR Not detected        Parainfluenza 2 PCR Not detected        Parainfluenza 3 PCR Not detected        Parainfluenza 4 PCR Not detected  Respiratory Syncytial Virus by PCR Not detected        Bordetella parapertussis by PCR Not detected        Bordetella pertussis by PCR Not detected        Chlamydophila Pneumonia PCR Not detected        Mycoplasma pneumo by PCR Not detected       Culture, Blood, PCR ID Panel [[id number]]  (Abnormal) Collected: 06/20/22 1820    Order Status: Completed Specimen: Blood Updated: 06/21/22 1848     Accession Number [id number]     Enterococcus faecalis by PCR Not detected        Enterococcus faecium by PCR Not detected        Listeria monocytogenes by PCR Not detected        STAPHYLOCOCCUS Not detected        Staphylococcus Aureus Not detected        Staphylococcus epidermidis by PCR Not detected        Staphylococcus lugdunensis by PCR Not detected        STREPTOCOCCUS Not detected        Streptococcus agalactiae (Group B) Not detected        Strep pneumoniae Not detected        Strep pyogenes,(Grp. A) Not detected         Acinetobacter calcoac baumannii complex by PCR Not detected        Bacteroides fragilis by PCR Detected        Enterobacteriaceae by PCR Not detected        Enterobacter cloacae complex by PCR Not detected        Escherichia Coli Not detected        Klebsiella aerogenes by PCR Not detected        Klebsiella oxytoca by PCR Not detected        Klebsiella pneumoniae group by PCR Not detected        Proteus by PCR Not detected        Salmonella species by PCR Not detected        Serratia marcescens by PCR Not detected        Haemophilus Influenzae by PCR Not detected        Neisseria meningitidis by PCR Not detected        Pseudomonas aeruginosa Not detected        Stenotrophomonas maltophilia by PCR Not detected        Candida albicans by PCR Not detected        Candida auris by PCR Not detected        Candida glabrata Not detected        Candida krusei by PCR Not detected        Candida parapsilosis by PCR Not detected        Candida tropicalis by PCR Not detected        Cryptococcus neoformans/gattii by PCR Not detected        Resistant gene targets          [employer] test comment       False positive results may rarely occur. Correlate with clinical,epidemiologic, and other laboratory findings           Comment: Please see BCID Interpretation Guide in EPIC Links       Blood Culture 1 [[id number]]  (Abnormal) Collected: 06/20/22 1800    Order Status: Completed Specimen: Blood Updated: 06/23/22 1031     Special Requests NO SPECIAL REQUESTS  Gram stain       ANAEROBIC BOTTLE Gram negative rods                  SMEAR CALLED TO AND CORRECTLY REPEATED BY: RN W BARKER SVTSD ON [id number] AT 1836 HRS TO 1396           Culture       Bacteroides fragilis BETA LACTAMASE POSITIVE GROWING IN 1 OF 2 BOTTLES DRAWN SITE=RFA                      RADIOLOGY:    All available imaging studies/reports in connect care for this admission were reviewed      Disclaimer: Sections of this note are dictated utilizing voice recognition  software, which may have resulted in some phonetic based errors in grammar and contents. Even though attempts were made to correct all the mistakes, some may have been missed, and remained in the body of the document. If questions arise, please contact our department.    Dr. Raiford Simmonds, Infectious Disease Specialist  [phone]  June 24, 2022  3:06 PM

## 2022-06-24 NOTE — Other (Signed)
TRANSFER - IN REPORT:    Verbal report received from Merlin, [place]  on [name] Short  being received from Room 466 for ordered procedure      Report consisted of patient's Situation, Background, Assessment and   Recommendations(SBAR).     Information from the following report(s) Nurse Handoff Report was reviewed with the receiving nurse.    Opportunity for questions and clarification was provided.      Assessment completed upon patient's arrival to unit and care assumed.

## 2022-06-24 NOTE — Other (Signed)
SBAR given to C. Roberts, RN Pre Op Nurse for continuing of care.

## 2022-06-24 NOTE — Other (Signed)
TRANSFER - OUT REPORT:    Verbal report given to [facility] on [place]  being transferred to room for routine progression of patient care       Report consisted of patient's Situation, Background, Assessment and   Recommendations(SBAR).     Information from the following report(s) Surgery Report, Intake/Output, and MAR was reviewed with the receiving nurse.           Lines:   Peripheral IV 06/20/22 Posterior;Right Forearm (Active)   Site Assessment Clean, dry & intact 06/23/22 2130   Line Status Infusing 06/23/22 2130   Line Care Connections checked and tightened 06/23/22 2130   Phlebitis Assessment No symptoms 06/23/22 2130   Infiltration Assessment 0 06/23/22 2130   Alcohol Cap Used Yes 06/23/22 2130   Dressing Status Clean, dry & intact 06/23/22 2130   Dressing Type Transparent 06/23/22 2130       Peripheral IV 06/20/22 Left;Anterior Forearm (Active)   Site Assessment Clean, dry & intact 06/23/22 2130   Line Status Infusing 06/23/22 2130   Line Care Connections checked and tightened 06/23/22 2130   Phlebitis Assessment No symptoms 06/23/22 2130   Infiltration Assessment 0 06/23/22 2130   Alcohol Cap Used Yes 06/23/22 2130   Dressing Status Clean, dry & intact 06/23/22 2130   Dressing Type Transparent 06/23/22 2130        Opportunity for questions and clarification was provided.      Patient transported with:  O2 @ 2lpm and Tech

## 2022-06-24 NOTE — Anesthesia Post-Procedure Evaluation (Signed)
Department of Anesthesiology  Postprocedure Note    Patient: Kaniya Trueheart  MRN: [id number]  Birthdate: [birth year]  Date of evaluation: 06/24/2022      Procedure Summary     Date: 06/24/22 Room / Location: [facility] MAIN 03 / [facility] MAIN OR    Anesthesia Start: 1633 Anesthesia Stop: 7425    Procedure: INCISION AND DRAINAGE /DEBRIDEMENT SACRAL DECUBITUS (Sacrum) Diagnosis:       Pressure injury of skin of sacral region, unspecified injury stage      (Pressure injury of skin of sacral region, unspecified injury stage [L89.159])    Surgeons: Nestor Ramp, DO Responsible Provider: Lucretia Field, MD    Anesthesia Type: General ASA Status: 3          Anesthesia Type: General    Aldrete Phase I: Aldrete Score: 8    Aldrete Phase II:        Anesthesia Post Evaluation    Patient location during evaluation: PACU  Patient participation: complete - patient participated  Level of consciousness: sleepy but conscious  Pain score: 0  Airway patency: patent  Nausea & Vomiting: no nausea and no vomiting  Complications: no  Cardiovascular status: blood pressure returned to baseline  Respiratory status: acceptable  Hydration status: euvolemic  Pain management: adequate

## 2022-06-24 NOTE — Case Communication (Signed)
Pt was admitted to [facility] on 06/20/2022 for severe sepsis.  Transfer [facility] completed.

## 2022-06-24 NOTE — Other (Signed)
At 1625h patient verbalized the she sees floaters and quite dizzy, vital signs rechecked as follows- BP- 113/53 mmhg, Hr- 77 bpm, 02 sat-97% and temperature of 99.3 farenheit. Blood glucose was rechecked 187 mg/dl, after rechecking the vital signs patient said that she feels better and verbalized that she is okay, informed Dr. Nyoka Cowden.

## 2022-06-24 NOTE — Consults (Signed)
History & Physical    Date: 06/24/2022    Referring Physician: Hospitlaist    Assessment:    Full code    Principal Problem:    Severe sepsis [facility])  Active Problems:    Obstructive sleep apnea syndrome    Diabetes ([place])    Decubitus ulcer of sacral region, stage 4 (HCC)    Hypertension    History of CVA (cerebrovascular accident)    Hyperlipidemia    Sacral osteomyelitis ([place])  Resolved Problems:    * No resolved hospital problems. *      Plan:   I have discussed the above findings with [name] and personally reviewed her CT scan of the abdomen and pelvis demonstrating developing abscess sacral decubitus. She has been NPO since midnight. I recommended incision and drainage/debridement today in the operating room. The risks and benefits of the procedure were reviewed including infection, bleeding, need for repeat procedure, injury to surrounding structures. Questions were answered.     History of Present Illness:  [name] Short is a 66 y.o. year old female who presented with increasing drainage and foul smelling odor from her wound. Her daughter states this started at the beginning of the month the nurses noted a change in her sacral decubitus. She also noted increasing fatigue with elevated blood glucose levels.     History:  Past Medical History:   Diagnosis Date    Anemia     Arthritis     Chronic pain     Legs and Shoulders    Diabetes ([place])     Exposure to asbestos     History of blood transfusion     History of colon polyps     HTN (hypertension)     Hx of blood clots     DVT    Hyperlipemia     Menopause     Pulmonary nodule     Serum calcium elevated     Sleep apnea     not using cpap    Stroke [facility])      Past Surgical History:   Procedure Laterality Date    CESAREAN SECTION  1987    COLONOSCOPY N/A 01/13/2017    COLONOSCOPY performed by Joycelyn Schmid, MD at [facility] N/A 12/28/2021    SACRAL WOUND DEBRIDEMENT INCISION AND  DRAINAGE performed by Nestor Ramp, DO at [facility] N/A 12/09/2021    CERVICAL THREE/FOUR/FIVE/SIX LAMINECTOMY FUSION; C-ARM; STRYKER; EXT BONE GROWTH STIM; 23 HR performed by Candi Leash, MD at [facility] MAIN OR     Family History   Problem Relation Age of Onset    Diabetes Other 32        parent, NOS    Cancer Father     Hypertension Other 35        parent,NOS    Heart Disease Other 110        parent, NOS    Lung Disease Father      Social History     Socioeconomic History    Marital status: Single     Spouse name: Not on file    Number of children: Not on file    Years of education: Not on file    Highest education level: Not on file   Occupational History    Not on file   Tobacco Use  Smoking status: Former     Packs/day: 1     Types: Cigarettes, Cigars     Quit date: 11/13/2021     Years since quitting: 0.6    Smokeless tobacco: Never   Vaping Use    Vaping Use: Never used   Substance and Sexual Activity    Alcohol use: Not Currently     Comment: occ    Drug use: Never    Sexual activity: Not on file   Other Topics Concern    Not on file   Social History Narrative    Not on file     Social Determinants of Health     Financial Resource Strain: Low Risk  (01/05/2022)    Overall Financial Resource Strain (CARDIA)     Difficulty of Paying Living Expenses: Not hard at all   Food Insecurity: No Food Insecurity (01/05/2022)    Hunger Vital Sign     Worried About Running Out of Food in the Last Year: Never true     Ran Out of Food in the Last Year: Never true   Transportation Needs: No Transportation Needs (02/09/2022)    OASIS A1250: Transportation     Lack of Transportation (Medical): No     Lack of Transportation (Non-Medical): No     Patient Unable or Declines to Respond: No   Physical Activity: Inactive (01/05/2022)    Exercise Vital Sign     Days of Exercise per Week: 0 days     Minutes of Exercise per Session: 0 min   Stress: No Stress Concern Present (01/05/2022)    [place]     Feeling of Stress : Not at all   Social Connections: Feeling Socially Integrated (02/09/2022)    OASIS D0700: Social Isolation     Frequency of experiencing loneliness or isolation: Never   Recent Concern: Social Connections - Moderately Isolated (01/05/2022)    Social Connection and Isolation Panel [NHANES]     Frequency of Communication with Friends and Family: Three times a week     Frequency of Social Gatherings with Friends and Family: More than three times a week     Attends Religious Services: More than 4 times per year     Active Member of Clubs or Organizations: No     Attends Archivist Meetings: Never     Marital Status: Widowed   Intimate Partner Violence: Not At Risk (01/05/2022)    Humiliation, Afraid, Rape, and Kick questionnaire     Fear of Current or Ex-Partner: No     Emotionally Abused: No     Physically Abused: No     Sexually Abused: No   Housing Stability: Low Risk  (01/05/2022)    Housing Stability Vital Sign     Unable to Pay for Housing in the Last Year: No     Number of [place] in the Last Year: 1     Unstable Housing in the Last Year: No     Allergies   Allergen Reactions    Lactose Other (See Comments)     Intolerance       Medications:  No current facility-administered medications on file prior to encounter.     Current Outpatient Medications on File Prior to Encounter   Medication Sig Dispense Refill    baclofen (LIORESAL) 10 MG tablet Take 1 tablet by mouth 3 times daily as needed (PAIN) Indications: Pain FOR PAIN PER SHETH  Magnesium Oxide 400 MG CAPS Take 1 capsule by mouth daily.      Potassium Chloride Crys ER (KLOR-CON M10 PO) Take 1 tablet by mouth daily.      cinacalcet (SENSIPAR) 30 MG tablet Take 1 tablet by mouth in the morning and 1 tablet in the evening.      aspirin 325 MG tablet Take 1 tablet by mouth daily      acetaminophen (TYLENOL) 500 MG tablet Take 1 tablet by mouth every 6 hours as needed for Fever or Pain  30 tablet 0    insulin glargine (LANTUS) 100 UNIT/ML injection vial Inject 34 Units into the skin daily (Patient taking differently: Inject 30 Units into the skin daily) 10 mL 0    insulin lispro (HUMALOG) 100 UNIT/ML SOLN injection vial Inject 4 Units into the skin 3 times daily (with meals) 1 each 0    insulin lispro (HUMALOG) 100 UNIT/ML SOLN injection vial Sliding Scale insulin as per Institutional protocol 1 each 0    atorvastatin (LIPITOR) 80 MG tablet Take 1 tablet by mouth nightly 30 tablet 3    ferrous sulfate (IRON 325) 325 (65 Fe) MG tablet Take 1 tablet by mouth daily 30 tablet     Multiple Vitamins-Minerals (THERAPEUTIC MULTIVITAMIN-MINERALS) tablet Take 1 tablet by mouth daily      furosemide (LASIX) 40 MG tablet furosemide 40 mg tablet   TAKE 1 TABLET BY MOUTH EVERY DAY         Review of Systems:  Review of Systems   Constitutional:  Positive for fatigue. Negative for fever.   Respiratory:  Negative for chest tightness and shortness of breath.    Skin:  Positive for wound. Negative for pallor and rash.   Hematological:  Negative for adenopathy. Does not bruise/bleed easily.       Physical Exam:  Patient Vitals for the past 24 hrs:   BP Temp Temp src Pulse Resp SpO2   06/24/22 0808 137/79 98.4 F (36.9 C) Oral 77 20 97 %   06/24/22 0430 (!) 157/68 99.4 F (37.4 C) Axillary 87 20 95 %   06/24/22 0015 138/75 98.7 F (37.1 C) Oral 93 20 95 %   06/23/22 2044 -- -- -- 89 -- 97 %   06/23/22 2000 (!) 158/75 98.2 F (36.8 C) Oral 90 20 97 %   06/23/22 1529 (!) 127/59 98.6 F (37 C) Oral 87 20 97 %   06/23/22 1430 -- 99.4 F (37.4 C) Oral -- -- --   06/23/22 1208 -- (!) 100.7 F (38.2 C) Oral -- -- --   06/23/22 1117 138/60 (!) 101.9 F (38.8 C) Oral 95 18 96 %       Physical Exam  Constitutional:       Appearance: Normal appearance.   Eyes:      Extraocular Movements: Extraocular movements intact.      Conjunctiva/sclera: Conjunctivae normal.      Pupils: Pupils are equal, round, and reactive to light.    Cardiovascular:      Rate and Rhythm: Normal rate.   Pulmonary:      Effort: Pulmonary effort is normal.   Neurological:      General: No focal deficit present.      Mental Status: She is alert and oriented to person, place, and time. Mental status is at baseline.   Psychiatric:         Mood and Affect: Mood normal.         Behavior:  Behavior normal.         Thought Content: Thought content normal.         Judgment: Judgment normal.         Laboratory Data:  Recent Results (from the past 2016 hour(s))   EKG 12 Lead    Collection Time: 06/20/22  5:58 PM   Result Value Ref Range    Ventricular Rate 112 BPM    Atrial Rate 112 BPM    P-R Interval 130 ms    QRS Duration 74 ms    Q-T Interval 312 ms    QTc Calculation (Bazett) 425 ms    P Axis 65 degrees    R Axis 26 degrees    T Axis 61 degrees    Diagnosis       Sinus tachycardia  Otherwise normal ECG  When compared with ECG of 26-Dec-2021 20:27,  No significant change was found  Confirmed by Theodoro Parma MD, Marcello Moores [phone]) on 06/21/2022 3:37:42 PM     Blood Culture 1    Collection Time: 06/20/22  6:00 PM    Specimen: Blood   Result Value Ref Range    Special Requests NO SPECIAL REQUESTS      Gram stain ANAEROBIC BOTTLE Gram negative rods      Gram stain        SMEAR CALLED TO AND CORRECTLY REPEATED BY: RN W BARKER SVTSD ON 18NOV23 AT 1836 HRS TO 1396    Culture (A)       Bacteroides fragilis BETA LACTAMASE POSITIVE GROWING IN 1 OF 2 BOTTLES DRAWN SITE=RFA   Blood Culture 2    Collection Time: 06/20/22  6:20 PM    Specimen: Blood   Result Value Ref Range    Special Requests NO SPECIAL REQUESTS      Culture NO GROWTH 4 DAYS     CBC with Auto Differential    Collection Time: 06/20/22  6:20 PM   Result Value Ref Range    WBC 11.3 4.6 - 13.2 K/uL    RBC 3.79 (L) 4.20 - 5.30 M/uL    Hemoglobin 8.4 (L) 12.0 - 16.0 g/dL    Hematocrit 27.8 (L) 35.0 - 45.0 %    MCV 73.4 (L) 78.0 - 100.0 FL    MCH 22.2 (L) 24.0 - 34.0 PG    MCHC 30.2 (L) 31.0 - 37.0 g/dL    RDW 18.4 (H) 11.6 - 14.5 %     Platelets 354 135 - 420 K/uL    MPV 9.1 (L) 9.2 - 11.8 FL    Nucleated RBCs 0.0 0 PER 100 WBC    nRBC 0.00 0.00 - 0.01 K/uL    Neutrophils % 87 (H) 40 - 73 %    Lymphocytes % 5 (L) 21 - 52 %    Monocytes % 7 3 - 10 %    Eosinophils % 0 0 - 5 %    Basophils % 0 0 - 2 %    Immature Granulocytes 1 (H) 0.0 - 0.5 %    Neutrophils Absolute 9.8 (H) 1.8 - 8.0 K/UL    Lymphocytes Absolute 0.5 (L) 0.9 - 3.6 K/UL    Monocytes Absolute 0.8 0.05 - 1.2 K/UL    Eosinophils Absolute 0.0 0.0 - 0.4 K/UL    Basophils Absolute 0.0 0.0 - 0.1 K/UL    Absolute Immature Granulocyte 0.1 (H) 0.00 - 0.04 K/UL    Differential Type AUTOMATED     Comprehensive Metabolic Panel    Collection  Time: 06/20/22  6:20 PM   Result Value Ref Range    Sodium 139 136 - 145 mmol/L    Potassium 4.2 3.5 - 5.5 mmol/L    Chloride 107 100 - 111 mmol/L    CO2 29 21 - 32 mmol/L    Anion Gap 3 3.0 - 18 mmol/L    Glucose 200 (H) 74 - 99 mg/dL    BUN 15 7.0 - 18 MG/DL    Creatinine 0.88 0.6 - 1.3 MG/DL    Bun/Cre Ratio 17 12 - 20      Est, Glom Filt Rate >60 >60 ml/min/1.61m    Calcium 9.6 8.5 - 10.1 MG/DL    Total Bilirubin 0.4 0.2 - 1.0 MG/DL    ALT 30 13 - 56 U/L    AST 21 10 - 38 U/L    Alk Phosphatase 137 (H) 45 - 117 U/L    Total Protein 6.5 6.4 - 8.2 g/dL    Albumin 2.5 (L) 3.4 - 5.0 g/dL    Globulin 4.0 2.0 - 4.0 g/dL    Albumin/Globulin Ratio 0.6 (L) 0.8 - 1.7     Procalcitonin    Collection Time: 06/20/22  6:20 PM   Result Value Ref Range    Procalcitonin 0.79 ng/mL   Troponin    Collection Time: 06/20/22  6:20 PM   Result Value Ref Range    Troponin, High Sensitivity 12 0 - 54 ng/L   Lactic Acid    Collection Time: 06/20/22  6:20 PM   Result Value Ref Range    Lactic Acid, Plasma 2.2 (HH) 0.4 - 2.0 MMOL/L   Respiratory Panel, Molecular, with COVID-19 (Restricted: peds pts or suitable admitted adults)    Collection Time: 06/20/22  6:20 PM    Specimen: Nasopharyngeal   Result Value Ref Range    Adenovirus by PCR Not detected NOTD      Coronavirus 229E by  PCR Not detected NOTD      Coronavirus HKU1 by PCR Not detected NOTD      Coronavirus NL63 by PCR Not detected NOTD      Coronavirus OC43 by PCR Not detected NOTD      SARS-CoV-2, PCR Not detected NOTD      Human Metapneumovirus by PCR Not detected NOTD      Rhinovirus Enterovirus PCR Not detected NOTD      Influenza A by PCR Not detected NOTD      Influenza B PCR Not detected NOTD      Parainfluenza 1 PCR Not detected NOTD      Parainfluenza 2 PCR Not detected NOTD      Parainfluenza 3 PCR Not detected NOTD      Parainfluenza 4 PCR Not detected NOTD      Respiratory Syncytial Virus by PCR Not detected NOTD      Bordetella parapertussis by PCR Not detected NOTD      Bordetella pertussis by PCR Not detected NOTD      Chlamydophila Pneumonia PCR Not detected NOTD      Mycoplasma pneumo by PCR Not detected NOTD     Hemoglobin A1c    Collection Time: 06/20/22  6:20 PM   Result Value Ref Range    Hemoglobin A1C 8.2 (H) 4.2 - 5.6 %    eAG 189 mg/dL   Culture, Blood, PCR ID Panel    Collection Time: 06/20/22  6:20 PM    Specimen: Blood   Result Value Ref Range    Accession  Number [id number]     Enterococcus faecalis by PCR Not detected NOTD      Enterococcus faecium by PCR Not detected NOTD      Listeria monocytogenes by PCR Not detected NOTD      STAPHYLOCOCCUS Not detected NOTD      Staphylococcus Aureus Not detected NOTD      Staphylococcus epidermidis by PCR Not detected NOTD      Staphylococcus lugdunensis by PCR Not detected NOTD      STREPTOCOCCUS Not detected NOTD      Streptococcus agalactiae (Group B) Not detected NOTD      Strep pneumoniae Not detected NOTD      Strep pyogenes,(Grp. A) Not detected NOTD      Acinetobacter calcoac baumannii complex by PCR Not detected NOTD      Bacteroides fragilis by PCR Detected (A) NOTD      Enterobacteriaceae by PCR Not detected NOTD      Enterobacter cloacae complex by PCR Not detected NOTD      Escherichia Coli Not detected NOTD      Klebsiella aerogenes by PCR Not detected  NOTD      Klebsiella oxytoca by PCR Not detected NOTD      Klebsiella pneumoniae group by PCR Not detected NOTD      Proteus by PCR Not detected NOTD      Salmonella species by PCR Not detected NOTD      Serratia marcescens by PCR Not detected NOTD      Haemophilus Influenzae by PCR Not detected NOTD      Neisseria meningitidis by PCR Not detected NOTD      Pseudomonas aeruginosa Not detected NOTD      Stenotrophomonas maltophilia by PCR Not detected NOTD      Candida albicans by PCR Not detected NOTD      Candida auris by PCR Not detected NOTD      Candida glabrata Not detected NOTD      Candida krusei by PCR Not detected NOTD      Candida parapsilosis by PCR Not detected NOTD      Candida tropicalis by PCR Not detected NOTD      Cryptococcus neoformans/gattii by PCR Not detected NOTD      Resistant gene targets        [employer] test comment        False positive results may rarely occur. Correlate with clinical,epidemiologic, and other laboratory findings   Lactic Acid    Collection Time: 06/20/22  8:22 PM   Result Value Ref Range    Lactic Acid, Plasma 0.8 0.4 - 2.0 MMOL/L   Urinalysis    Collection Time: 06/20/22 11:42 PM   Result Value Ref Range    Color, UA YELLOW      Appearance CLEAR      Specific Gravity, UA >1.030 (H) 1.003 - 1.030    pH, Urine 6.0 5.0 - 8.0      Protein, UA 30 (A) NEG mg/dL    Glucose, UA Negative NEG mg/dL    Ketones, Urine Negative NEG mg/dL    Bilirubin Urine Negative NEG      Blood, Urine MODERATE (A) NEG      Urobilinogen, Urine 1.0 0.2 - 1.0 EU/dL    Nitrite, Urine Negative NEG      Leukocyte Esterase, Urine Negative NEG     Urinalysis, Micro    Collection Time: 06/20/22 11:42 PM   Result Value Ref Range    WBC,  UA Negative 0 - 4 /hpf    RBC, UA Negative 0 - 5 /hpf    Epithelial Cells UA 1+ 0 - 5 /lpf    BACTERIA, URINE FEW (A) NEG /hpf   POCT Glucose    Collection Time: 06/20/22 11:57 PM   Result Value Ref Range    POC Glucose 228 (H) 70 - 110 mg/dL   Basic Metabolic Panel w/ Reflex  to MG    Collection Time: 06/21/22  3:38 AM   Result Value Ref Range    Sodium 140 136 - 145 mmol/L    Potassium 3.3 (L) 3.5 - 5.5 mmol/L    Chloride 109 100 - 111 mmol/L    CO2 26 21 - 32 mmol/L    Anion Gap 5 3.0 - 18 mmol/L    Glucose 252 (H) 74 - 99 mg/dL    BUN 14 7.0 - 18 MG/DL    Creatinine 0.94 0.6 - 1.3 MG/DL    Bun/Cre Ratio 15 12 - 20      Est, Glom Filt Rate >60 >60 ml/min/1.34m    Calcium 9.1 8.5 - 10.1 MG/DL   CBC with Auto Differential    Collection Time: 06/21/22  3:38 AM   Result Value Ref Range    WBC 11.6 4.6 - 13.2 K/uL    RBC 3.75 (L) 4.20 - 5.30 M/uL    Hemoglobin 8.3 (L) 12.0 - 16.0 g/dL    Hematocrit 27.7 (L) 35.0 - 45.0 %    MCV 73.9 (L) 78.0 - 100.0 FL    MCH 22.1 (L) 24.0 - 34.0 PG    MCHC 30.0 (L) 31.0 - 37.0 g/dL    RDW 18.5 (H) 11.6 - 14.5 %    Platelets 302 135 - 420 K/uL    MPV 8.7 (L) 9.2 - 11.8 FL    Nucleated RBCs 0.0 0 PER 100 WBC    nRBC 0.00 0.00 - 0.01 K/uL    Neutrophils % 81 (H) 40 - 73 %    Lymphocytes % 5 (L) 21 - 52 %    Monocytes % 13 (H) 3 - 10 %    Eosinophils % 1 0 - 5 %    Basophils % 0 0 - 2 %    Immature Granulocytes 1 (H) 0.0 - 0.5 %    Neutrophils Absolute 9.4 (H) 1.8 - 8.0 K/UL    Lymphocytes Absolute 0.5 (L) 0.9 - 3.6 K/UL    Monocytes Absolute 1.5 (H) 0.05 - 1.2 K/UL    Eosinophils Absolute 0.1 0.0 - 0.4 K/UL    Basophils Absolute 0.0 0.0 - 0.1 K/UL    Absolute Immature Granulocyte 0.1 (H) 0.00 - 0.04 K/UL    Differential Type AUTOMATED     Magnesium    Collection Time: 06/21/22  3:38 AM   Result Value Ref Range    Magnesium 1.3 (L) 1.6 - 2.6 mg/dL   POCT Glucose    Collection Time: 06/21/22  8:12 AM   Result Value Ref Range    POC Glucose 238 (H) 70 - 110 mg/dL   POCT Glucose    Collection Time: 06/21/22 11:03 AM   Result Value Ref Range    POC Glucose 227 (H) 70 - 110 mg/dL   POCT Glucose    Collection Time: 06/21/22  3:15 PM   Result Value Ref Range    POC Glucose 258 (H) 70 - 110 mg/dL   POCT Glucose    Collection Time: 06/21/22  4:37 PM  Result Value Ref  Range    POC Glucose 256 (H) 70 - 110 mg/dL   POCT Glucose    Collection Time: 06/21/22  5:48 PM   Result Value Ref Range    POC Glucose 224 (H) 70 - 110 mg/dL   POCT Glucose    Collection Time: 06/21/22  8:22 PM   Result Value Ref Range    POC Glucose 255 (H) 70 - 110 mg/dL   POCT Glucose    Collection Time: 06/21/22  9:49 PM   Result Value Ref Range    POC Glucose 257 (H) 70 - 110 mg/dL   POCT Glucose    Collection Time: 06/21/22 11:13 PM   Result Value Ref Range    POC Glucose 236 (H) 70 - 110 mg/dL   Basic Metabolic Panel w/ Reflex to MG    Collection Time: 06/22/22  4:33 AM   Result Value Ref Range    Sodium 140 136 - 145 mmol/L    Potassium 3.7 3.5 - 5.5 mmol/L    Chloride 111 100 - 111 mmol/L    CO2 25 21 - 32 mmol/L    Anion Gap 4 3.0 - 18 mmol/L    Glucose 140 (H) 74 - 99 mg/dL    BUN 16 7.0 - 18 MG/DL    Creatinine 0.91 0.6 - 1.3 MG/DL    Bun/Cre Ratio 18 12 - 20      Est, Glom Filt Rate >60 >60 ml/min/1.31m    Calcium 9.3 8.5 - 10.1 MG/DL   CBC with Auto Differential    Collection Time: 06/22/22  4:33 AM   Result Value Ref Range    WBC 10.1 4.6 - 13.2 K/uL    RBC 3.15 (L) 4.20 - 5.30 M/uL    Hemoglobin 6.8 (L) 12.0 - 16.0 g/dL    Hematocrit 22.8 (L) 35.0 - 45.0 %    MCV 72.4 (L) 78.0 - 100.0 FL    MCH 21.6 (L) 24.0 - 34.0 PG    MCHC 29.8 (L) 31.0 - 37.0 g/dL    RDW 18.7 (H) 11.6 - 14.5 %    Platelets 304 135 - 420 K/uL    MPV 9.4 9.2 - 11.8 FL    Nucleated RBCs 0.0 0 PER 100 WBC    nRBC 0.00 0.00 - 0.01 K/uL    Neutrophils % 80 (H) 40 - 73 %    Lymphocytes % 6 (L) 21 - 52 %    Monocytes % 13 (H) 3 - 10 %    Eosinophils % 1 0 - 5 %    Basophils % 0 0 - 2 %    Immature Granulocytes 1 (H) 0.0 - 0.5 %    Neutrophils Absolute 8.0 1.8 - 8.0 K/UL    Lymphocytes Absolute 0.6 (L) 0.9 - 3.6 K/UL    Monocytes Absolute 1.3 (H) 0.05 - 1.2 K/UL    Eosinophils Absolute 0.1 0.0 - 0.4 K/UL    Basophils Absolute 0.0 0.0 - 0.1 K/UL    Absolute Immature Granulocyte 0.1 (H) 0.00 - 0.04 K/UL    Differential Type AUTOMATED      Vancomycin Level, Random    Collection Time: 06/22/22  4:33 AM   Result Value Ref Range    Vancomycin Rm 20.3 5.0 - 40.0 UG/ML   Culture, Blood 1    Collection Time: 06/22/22  4:33 AM    Specimen: Blood   Result Value Ref Range    Special Requests NO SPECIAL REQUESTS  Culture NO GROWTH 2 DAYS     Culture, Blood 2    Collection Time: 06/22/22  4:33 AM    Specimen: Blood   Result Value Ref Range    Special Requests NO SPECIAL REQUESTS      Culture NO GROWTH 2 DAYS     POCT Glucose    Collection Time: 06/22/22  6:30 AM   Result Value Ref Range    POC Glucose 132 (H) 70 - 110 mg/dL   POCT Glucose    Collection Time: 06/22/22 11:01 AM   Result Value Ref Range    POC Glucose 225 (H) 70 - 110 mg/dL   TYPE AND SCREEN    Collection Time: 06/22/22 12:45 PM   Result Value Ref Range    Crossmatch expiration date 06/25/2022,2359     ABO/Rh B POSITIVE     Antibody Screen NEG     CALLED TO [facility] HELEN CVSD AT 19:39 ON 06/22/2022 BY AKM     Unit Number [id number]     Product Code Blood Bank RC LR     Unit Divison 00     Dispense Status Blood Bank TRANSFUSED     Crossmatch Result Compatible    POCT Glucose    Collection Time: 06/22/22  1:29 PM   Result Value Ref Range    POC Glucose 367 (H) 70 - 110 mg/dL   POCT Glucose    Collection Time: 06/22/22  1:32 PM   Result Value Ref Range    POC Glucose 369 (H) 70 - 110 mg/dL   POCT Glucose    Collection Time: 06/22/22  4:01 PM   Result Value Ref Range    POC Glucose 348 (H) 70 - 110 mg/dL   POCT Glucose    Collection Time: 06/22/22  6:40 PM   Result Value Ref Range    POC Glucose 331 (H) 70 - 110 mg/dL   PREPARE RBC (CROSSMATCH), 1 Units    Collection Time: 06/22/22  7:30 PM   Result Value Ref Range    History Check Historical check performed    POCT Glucose    Collection Time: 06/22/22  8:55 PM   Result Value Ref Range    POC Glucose 283 (H) 70 - 110 mg/dL   Basic Metabolic Panel w/ Reflex to MG    Collection Time: 06/23/22  4:05 AM   Result Value Ref Range    Sodium 141 136 -  145 mmol/L    Potassium 3.9 3.5 - 5.5 mmol/L    Chloride 111 100 - 111 mmol/L    CO2 25 21 - 32 mmol/L    Anion Gap 5 3.0 - 18 mmol/L    Glucose 268 (H) 74 - 99 mg/dL    BUN 16 7.0 - 18 MG/DL    Creatinine 0.95 0.6 - 1.3 MG/DL    Bun/Cre Ratio 17 12 - 20      Est, Glom Filt Rate >60 >60 ml/min/1.78m    Calcium 10.3 (H) 8.5 - 10.1 MG/DL   CBC with Auto Differential    Collection Time: 06/23/22  4:05 AM   Result Value Ref Range    WBC 11.4 4.6 - 13.2 K/uL    RBC 3.70 (L) 4.20 - 5.30 M/uL    Hemoglobin 8.5 (L) 12.0 - 16.0 g/dL    Hematocrit 27.5 (L) 35.0 - 45.0 %    MCV 74.3 (L) 78.0 - 100.0 FL    MCH 23.0 (L) 24.0 - 34.0 PG  MCHC 30.9 (L) 31.0 - 37.0 g/dL    RDW 19.7 (H) 11.6 - 14.5 %    Platelets 354 135 - 420 K/uL    MPV 8.9 (L) 9.2 - 11.8 FL    Nucleated RBCs 0.0 0 PER 100 WBC    nRBC 0.00 0.00 - 0.01 K/uL    Neutrophils % 82 (H) 40 - 73 %    Lymphocytes % 4 (L) 21 - 52 %    Monocytes % 11 (H) 3 - 10 %    Eosinophils % 2 0 - 5 %    Basophils % 0 0 - 2 %    Immature Granulocytes 1 (H) 0.0 - 0.5 %    Neutrophils Absolute 9.4 (H) 1.8 - 8.0 K/UL    Lymphocytes Absolute 0.4 (L) 0.9 - 3.6 K/UL    Monocytes Absolute 1.3 (H) 0.05 - 1.2 K/UL    Eosinophils Absolute 0.2 0.0 - 0.4 K/UL    Basophils Absolute 0.0 0.0 - 0.1 K/UL    Absolute Immature Granulocyte 0.1 (H) 0.00 - 0.04 K/UL    Differential Type AUTOMATED     POCT Glucose    Collection Time: 06/23/22  7:14 AM   Result Value Ref Range    POC Glucose 252 (H) 70 - 110 mg/dL   POCT Glucose    Collection Time: 06/23/22 11:21 AM   Result Value Ref Range    POC Glucose 285 (H) 70 - 110 mg/dL   POCT Glucose    Collection Time: 06/23/22  3:16 PM   Result Value Ref Range    POC Glucose 314 (H) 70 - 110 mg/dL   POCT Glucose    Collection Time: 06/23/22  8:02 PM   Result Value Ref Range    POC Glucose 350 (H) 70 - 110 mg/dL   Vancomycin Level, Random    Collection Time: 06/24/22  1:26 AM   Result Value Ref Range    Vancomycin Rm 12.3 5.0 - 40.0 UG/ML   POCT Glucose     Collection Time: 06/24/22  7:24 AM   Result Value Ref Range    POC Glucose 399 (H) 70 - 110 mg/dL       Diagnostic Studies:  Narrative & Impression  EXAM: CT ABDOMEN AND PELVIS WITH CONTRAST     CLINICAL INDICATION/HISTORY: sacral wound with concern for infection, recent  fall with pain to buttock. Weakness and fever for 2 days. Weakness. Stage IV  sacral ulcer. Increased drainage. Fall today.     COMPARISON: CT abdomen/pelvis 12/28/2021     TECHNIQUE:  CT abdomen and pelvis with IV contrast.   All CT scans at this facility are performed using dose optimization technique as  appropriate to the performed examination, to include automated exposure control,  adjustment of the mA and/or kV according to patient's size (including  appropriate matching for site-specific examinations), or use of an iterative  reconstruction technique.     FINDINGS:      Lower chest: Partially visualized 75m right middle lobe lung nodule is unchanged  from April 2022.     Liver: Unremarkable.      Biliary: Unremarkable.      Pancreas: Unremarkable.      Spleen: Unremarkable.      Adrenal glands: Unremarkable.      Kidneys: Unremarkable.      Bladder: Unremarkable.      Reproductive organs: Calcified uterine fibroid. Stable 2.6 cm right adnexal  cyst.      Bowel: Moderate stool  burden. Normal appendix. No evidence for bowel  obstruction.      Lymph nodes: No pathologically enlarged lymph nodes.      Vasculature: Mild burden of calcified atherosclerotic disease.      Other: No free fluid or free air.     Body wall: Extensive body wall edema. Ill-defined fluid and gas in the  subcutaneous fat overlying the sacrum, just right of midline. Collection overall  measures 6.2 x 2.7 x 8.7 cm. Significant surrounding stranding. This appears to  extend to the underlying sacrum (series 2, image 4-56). Also extends into the  gluteal cleft on the left (2, 64).     Bones: There is some erosion of the posterior elements of the midline sacrum  underlying the  above-described inflammatory process. (series 3, image [id number]).  Multilevel degenerative changes in the spine.        IMPRESSION:  1. Large ill-defined fluid and gas collection in the subcutaneous fat overlying  the sacrum, just right of midline. This is concerning for phlegmon or early  abscess. There is extension to the underlying sacrum, with erosion of the  posterior elements of the midline sacrum, consistent with osteomyelitis.  -Extensive surrounding stranding and edema.     2. Moderate stool burden, correlate for constipation.     3. 8 mm right middle lobe lung nodule is unchanged since at least April 2022.  Recommend follow-up in April 2024 to ensure two-year stability.     4. Stable small right adnexal cyst.           Specimen Collected: 06/20/22 20:21 EST             Nestor Ramp, [place]

## 2022-06-24 NOTE — Plan of Care (Signed)
Problem: Discharge Planning  Goal: Discharge to home or other facility with appropriate resources  06/24/2022 0034 by Claudie Revering, RN  Outcome: Progressing  06/23/2022 1056 by Chaya Jan, RN  Outcome: Progressing     Problem: Pain  Goal: Verbalizes/displays adequate comfort level or baseline comfort level  06/24/2022 0034 by Claudie Revering, RN  Outcome: Progressing  06/23/2022 1056 by Chaya Jan, RN  Outcome: Progressing     Problem: Skin/Tissue Integrity  Goal: Absence of new skin breakdown  Description: 1.  Monitor for areas of redness and/or skin breakdown  2.  Assess vascular access sites hourly  3.  Every 4-6 hours minimum:  Change oxygen saturation probe site  4.  Every 4-6 hours:  If on nasal continuous positive airway pressure, respiratory therapy assess nares and determine need for appliance change or resting period.  06/24/2022 0034 by Claudie Revering, RN  Outcome: Progressing  06/23/2022 1056 by Chaya Jan, RN  Outcome: Progressing     Problem: Safety - Adult  Goal: Free from fall injury  06/24/2022 0034 by Claudie Revering, RN  Outcome: Progressing  06/23/2022 1056 by Chaya Jan, RN  Outcome: Progressing     Problem: ABCDS Injury Assessment  Goal: Absence of physical injury  06/24/2022 0034 by Claudie Revering, RN  Outcome: Progressing  06/23/2022 1056 by Chaya Jan, RN  Outcome: Progressing     Problem: Chronic Conditions and Co-morbidities  Goal: Patient's chronic conditions and co-morbidity symptoms are monitored and maintained or improved  06/24/2022 0034 by Claudie Revering, RN  Outcome: Progressing  06/23/2022 1056 by Chaya Jan, RN  Outcome: Progressing     Problem: Nutrition Deficit:  Goal: Optimize nutritional status  06/24/2022 0034 by Claudie Revering, RN  Outcome: Progressing  06/23/2022 1056 by Chaya Jan, RN  Outcome: Progressing

## 2022-06-24 NOTE — Anesthesia Pre-Procedure Evaluation (Signed)
Department of Anesthesiology  Preprocedure Note       Name:  [name] Short   Age:  66 y.o.  DOB:  Sep 09, 1955                                          MRN:  [id number]         Date:  06/24/2022      Surgeon: Moishe Spice):  Phineas Semen, DO    Procedure: Procedure(s):  DEBRIDEMENT SACRAL DECUBITUS    Medications prior to admission:   Prior to Admission medications    Medication Sig Start Date End Date Taking? Authorizing Provider   baclofen (LIORESAL) 10 MG tablet Take 1 tablet by mouth 3 times daily as needed (PAIN) Indications: Pain FOR PAIN PER SHETH 04/07/22   Pearlean Brownie D, PA   Magnesium Oxide 400 MG CAPS Take 1 capsule by mouth daily.    [provider]   Potassium Chloride Crys ER (KLOR-CON M10 PO) Take 1 tablet by mouth daily.    [provider]   cinacalcet (SENSIPAR) 30 MG tablet Take 1 tablet by mouth in the morning and 1 tablet in the evening.    [provider]   aspirin 325 MG tablet Take 1 tablet by mouth daily    [provider]   acetaminophen (TYLENOL) 500 MG tablet Take 1 tablet by mouth every 6 hours as needed for Fever or Pain 01/23/22   Jonah Blue, MD   insulin glargine (LANTUS) 100 UNIT/ML injection vial Inject 34 Units into the skin daily  Patient taking differently: Inject 30 Units into the skin daily 01/23/22   Jonah Blue, MD   insulin lispro (HUMALOG) 100 UNIT/ML SOLN injection vial Inject 4 Units into the skin 3 times daily (with meals) 01/23/22   Jonah Blue, MD   insulin lispro (HUMALOG) 100 UNIT/ML SOLN injection vial Sliding Scale insulin as per Institutional protocol 01/23/22   Jonah Blue, MD   atorvastatin (LIPITOR) 80 MG tablet Take 1 tablet by mouth nightly 01/06/22   Loleta Books, MD   ferrous sulfate (IRON 325) 325 (65 Fe) MG tablet Take 1 tablet by mouth daily 01/06/22   Loleta Books, MD   Multiple Vitamins-Minerals (THERAPEUTIC MULTIVITAMIN-MINERALS) tablet Take 1 tablet by mouth daily 01/07/22   Loleta Books, MD   furosemide (LASIX) 40 MG tablet furosemide 40 mg tablet   TAKE 1 TABLET BY MOUTH EVERY DAY 09/23/21   [provider]       Current medications:    Current Facility-Administered Medications   Medication Dose Route Frequency Provider Last Rate Last Admin   . lactated ringers IV soln infusion   IntraVENous Continuous Brummett, Emilio Aspen., MD       . 0.9 % sodium chloride infusion   IntraVENous PRN Margo Aye, Brittney N, DO       . lactobacillus (CULTURELLE) capsule 1 capsule  1 capsule Oral Daily with breakfast Jonah Blue, MD   1 capsule at 06/23/22 0922   . vancomycin (VANCOCIN) 1,000 mg in sodium chloride 0.9 % 250 mL (vial-mate) IVPB  1,000 mg IntraVENous Q18H Gordy Levan, MD   Stopped at 06/24/22 0850   . sodium chloride flush 0.9 % injection 5-40 mL  5-40 mL IntraVENous 2 times per day Randalyn Rhea, APRN - NP   10 mL at 06/24/22 0851   . sodium chloride flush 0.9 %  injection 5-40 mL  5-40 mL IntraVENous PRN Randalyn Rhea, APRN - NP       . potassium chloride (KLOR-CON M) extended release tablet 40 mEq  40 mEq Oral PRN Randalyn Rhea, APRN - NP        Or   . potassium bicarb-citric acid (EFFER-K) effervescent tablet 40 mEq  40 mEq Oral PRN Randalyn Rhea, APRN - NP   40 mEq at 06/21/22 1520    Or   . potassium chloride 10 mEq/100 mL IVPB (Peripheral Line)  10 mEq IntraVENous PRN Randalyn Rhea, APRN - NP       . magnesium sulfate 2000 mg in 50 mL IVPB premix  2,000 mg IntraVENous PRN Randalyn Rhea, APRN - NP   Stopped at 06/21/22 1606   . ondansetron (ZOFRAN-ODT) disintegrating tablet 4 mg  4 mg Oral Q8H PRN Randalyn Rhea, APRN - NP        Or   . ondansetron [facility]) injection 4 mg  4 mg IntraVENous Q6H PRN Randalyn Rhea, APRN - NP       . polyethylene glycol (GLYCOLAX) packet 17 g  17 g Oral Daily PRN Randalyn Rhea, APRN - NP       . famotidine (PEPCID) tablet 20 mg  20 mg Oral Daily Randalyn Rhea, APRN - NP   20 mg at 06/23/22 0923   . acetaminophen (TYLENOL)  tablet 650 mg  650 mg Oral Q6H PRN Randalyn Rhea, APRN - NP   650 mg at 06/23/22 1207    Or   . acetaminophen (TYLENOL) suppository 650 mg  650 mg Rectal Q6H PRN Randalyn Rhea, APRN - NP       . insulin lispro (HUMALOG) injection vial 0-8 Units  0-8 Units SubCUTAneous 4x Daily AC & HS Hall, Brittney N, DO   9 Units at 06/24/22 1323   . glucose chewable tablet 16 g  4 tablet Oral PRN Randalyn Rhea, APRN - NP       . dextrose bolus 10% 125 mL  125 mL IntraVENous PRN Randalyn Rhea, APRN - NP        Or   . dextrose bolus 10% 250 mL  250 mL IntraVENous PRN Randalyn Rhea, APRN - NP       . glucagon injection 1 mg  1 mg SubCUTAneous PRN Randalyn Rhea, APRN - NP       . dextrose 10 % infusion   IntraVENous Continuous PRN Randalyn Rhea, APRN - NP       . piperacillin-tazobactam (ZOSYN) 3,375 mg in sodium chloride 0.9 % 50 mL IVPB (mini-bag)  3,375 mg IntraVENous Q8H Randalyn Rhea, APRN - NP 12.5 mL/hr at 06/24/22 1044 3,375 mg at 06/24/22 1044   . [Held by provider] heparin (porcine) injection 5,000 Units  5,000 Units SubCUTAneous 3 times per day Randalyn Rhea, APRN - NP   5,000 Units at 06/22/22 1334   . insulin glargine (LANTUS) injection vial 10 Units  10 Units SubCUTAneous Nightly Randalyn Rhea, APRN - NP   10 Units at 06/23/22 2056   . baclofen (LIORESAL) tablet 10 mg  10 mg Oral BID Randalyn Rhea, APRN - NP   10 mg at 06/23/22 2059   . [Held by provider] aspirin tablet 325 mg  325 mg Oral Daily Randalyn Rhea, APRN - NP   325 mg at 06/22/22 1028   . atorvastatin (LIPITOR) tablet 80 mg  80 mg Oral Nightly Randalyn Rhea, APRN - NP   80 mg at 06/23/22 2058   .  ferrous sulfate (IRON 325) tablet 325 mg  325 mg Oral Daily Randalyn RheaPatel, Kush[place], APRN - NP   325 mg at 06/23/22 [id number]       Allergies:    Allergies   Allergen Reactions   . Lactose Other (See Comments)     Intolerance       Problem List:    Patient Active Problem List   Diagnosis Code   . Iron deficiency anemia due to chronic blood loss D50.0   .  Diabetes (HCC) E11.9   . Occult blood in stools R19.5   . Stroke (HCC) I63.9   . Symptomatic anemia D64.9   . Cervical myelopathy (HCC) G95.9   . Obstructive sleep apnea syndrome G47.33   . Spinal stenosis of lumbar region M48.061   . Syncope R55   . Cerebrovascular accident (HCC) I63.9   . Failure to thrive in adult R62.7   . Skin ulcer of sacrum (HCC) L98.429   . RUQ pain R10.11   . 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 Lacunar infarct, acute (HCC) I63.81   . Acute CVA (cerebrovascular accident) (HCC) I63.9   . Sepsis (HCC) A41.9   . Severe sepsis (HCC) A41.9, R65.20   . Hypertension I10   . History of CVA (cerebrovascular accident) 15Z86.73   . Hyperlipidemia E78.5   . 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 Anemia    . Arthritis    . Chronic pain     Legs and Shoulders   . Diabetes (HCC)    . Exposure to asbestos    . History of blood transfusion    . History of colon polyps    . HTN (hypertension)    . Hx of blood clots     DVT   . Hyperlipemia    . Menopause    . Pulmonary nodule    . Serum calcium elevated    . Sleep apnea     not using cpap   . Stroke [facility](HCC)        Past Surgical History:        Procedure Laterality Date   . CESAREAN SECTION  1987   . COLONOSCOPY N/A 01/13/2017    COLONOSCOPY performed by Levan Hursthomas Duntemann, MD at [facility]COPY   . COLONOSCOPY     . DILATION AND CURETTAGE OF UTERUS  1995   . RECTAL SURGERY N/A 12/28/2021    SACRAL WOUND DEBRIDEMENT INCISION AND DRAINAGE performed by Phineas SemenNicole Yeshtokin, DO [facility]   . SPINE SURGERY N/A 12/09/2021    CERVICAL THREE/FOUR/FIVE/SIX LAMINECTOMY FUSION; C-ARM; STRYKER; EXT BONE GROWTH STIM; 23 HR performed [facility] Pamalee LeydenMark B Kerner[facility]       Social History:    Social History     Tobacco Use   . Smoking status: Former     Packs/day: 1     Types: Cigarettes, Cigars     Quit date: 11/13/2021     Years since quitting: 0.6   . Smokeless tobacco: Never   Substance Use Topics   .  Alcohol use: Not Currently     Comment: occ                                Counseling given: Not Answered  Vital Signs (Current):   Vitals:    06/24/22 0015 06/24/22 0430 06/24/22 0808 06/24/22 1140   BP: 138/75 (!) 157/68 137/79 130/69   Pulse: 93 87 77 72   Resp: 20 20 20 18    Temp: 98.7 F (37.1 C) 99.4 F (37.4 C) 98.4 F (36.9 C) 98.1 F (36.7 C)   TempSrc: Oral Axillary Oral Oral   SpO2: 95% 95% 97% 96%   Weight:       Height:                                                  BP Readings from Last 3 Encounters:   06/24/22 130/69   06/19/22 (!) 148/80   06/17/22 132/70       NPO Status: Time of last liquid consumption: 1800                        Time of last solid consumption: 1800                        Date of last liquid consumption: 06/23/22                        Date of last solid food consumption: 06/23/22    BMI:   Wt Readings from Last 3 Encounters:   06/22/22 81.1 kg (178 lb 12.7 oz)   03/03/22 66.7 kg (147 lb)   02/12/22 72.1 kg (159 lb)     Body mass index is 36.09 kg/m.    CBC:   Lab Results   Component Value Date/Time    WBC 11.4 06/23/2022 04:05 AM    RBC 3.70 06/23/2022 04:05 AM    HGB 8.5 06/23/2022 04:05 AM    HCT 27.5 06/23/2022 04:05 AM    MCV 74.3 06/23/2022 04:05 AM    RDW 19.7 06/23/2022 04:05 AM    PLT 354 06/23/2022 04:05 AM       CMP:   Lab Results   Component Value Date/Time    NA 141 06/23/2022 04:05 AM    K 3.9 06/23/2022 04:05 AM    CL 111 06/23/2022 04:05 AM    CO2 25 06/23/2022 04:05 AM    BUN 16 06/23/2022 04:05 AM    CREATININE 0.95 06/23/2022 04:05 AM    GFRAA >60.0 02/02/2022 04:00 PM    AGRATIO 0.9 05/26/2021 02:43 AM    LABGLOM >60 06/23/2022 04:05 AM    GLUCOSE 268 06/23/2022 04:05 AM    GLUCOSE 206 02/02/2022 04:00 PM    PROT 6.5 06/20/2022 06:20 PM    CALCIUM 10.3 06/23/2022 04:05 AM    BILITOT 0.4 06/20/2022 06:20 PM    ALKPHOS 137 06/20/2022 06:20 PM    ALKPHOS 139 02/02/2022 04:00 PM    AST 21 06/20/2022 06:20 PM    ALT 30 06/20/2022 06:20 PM       POC Tests:    Recent Labs     06/24/22  1142   POCGLU 277*       Coags: No results found for: "PROTIME", "INR", "APTT"    HCG (If Applicable): No results found for: "PREGTESTUR", "PREGSERUM", "HCG", "HCGQUANT"     ABGs: No results found for: "PHART", "PO2ART", "PCO2ART", "HCO3ART", "BEART", "O2SATART"     Type & Screen (  If Applicable):  No results found for: "LABABO", "LABRH"    Drug/Infectious Status (If Applicable):  No results found for: "HIV", "HEPCAB"    COVID-19 Screening (If Applicable):   Lab Results   Component Value Date/Time    COVID19 Not detected 06/20/2022 06:20 PM           Anesthesia Evaluation  Patient summary reviewed and Nursing notes reviewed no history of anesthetic complications:   Airway: Mallampati: III  TM distance: <3 FB   Neck ROM: full  Mouth opening: > = 3 FB   Dental:    (+) poor dentition      Pulmonary:normal exam    (+) sleep apnea: on CPAP,                             Cardiovascular:    (+) hypertension: mild,                   Neuro/Psych:   (+) CVA: residual symptoms,             GI/Hepatic/Renal:   (+) morbid obesity          Endo/Other:    (+) DiabetesType II DM, , .                 Abdominal:   (+) obese,           Vascular: negative vascular ROS.         Other Findings:           Anesthesia Plan      general     ASA 3       Induction: intravenous.      Anesthetic plan and risks discussed with patient.                        Bubba Camp, MD   06/24/2022

## 2022-06-24 NOTE — Progress Notes (Signed)
Progress Note  Hospitalist Service    Patient: [name] Short MRN: [id number]   SSN: [id number]  Date of Birth: 12/24/1955   Age: 66 y.o.  Sex: female      Admit Date: 06/20/2022    LOS: 4 days   Chief Complaint   Patient presents with    Fatigue       Subjective:     Patient admitted from home on 11/17 secondary to infection of stage IV decubitus sacral ulcer. She was receiving wound care at home but noticed increased pain and drainage. Patient underwent I&D and debridement with Dr. Yvone Neu (GS) on 11/21. Intra-op cultures were obtained. Dr. Allena Katz (ID) following for antibiotic recs.     Patient seen and examined before procedure. She has been sleeping off and on through the day. She is currently comfortable and without concerns.     Objective:     Vitals:  BP (!) 151/71   Pulse 89   Temp 99.7 F (37.6 C) (Axillary)   Resp 16   Ht 1.499 m (4' 11.02")   Wt 81.1 kg (178 lb 12.7 oz)   SpO2 98%   BMI 36.09 kg/m     Physical Exam:   General appearance: alert, appears stated age, and cooperative  Lungs: clear to auscultation bilaterally  Heart: regular rate and rhythm, S1, S2 normal, no murmur, click, rub or gallop  Abdomen: soft, non-tender.   Buttocks - bandaged; clean and dry.   Pulses: 2+ and symmetric  Skin: Skin color, texture, turgor normal. No rashes or lesions  Neuro:  normal without focal findings, mental status, speech normal, alert and oriented x3, PERLA, and reflexes normal and symmetric    Intake and Output:  Current Shift: No intake/output data recorded.  Last three shifts: 11/20 0701 - 11/21 1900  In: 200 [I.V.:200]  Out: 1000 [Urine:1000]    Lab/Data Review:  Recent Results (from the past 12 hour(s))   POCT Glucose    Collection Time: 06/24/22 11:42 AM   Result Value Ref Range    POC Glucose 277 (H) 70 - 110 mg/dL   POCT Glucose    Collection Time: 06/24/22  3:10 PM   Result Value Ref Range    POC Glucose 222 (H) 70 - 110 mg/dL   POCT Glucose    Collection Time: 06/24/22  4:24 PM   Result Value Ref  Range    POC Glucose 184 (H) 70 - 110 mg/dL         Key Findings or tests:       Telemetry NONE   Oxygen NONE     Assessment and Plan:     Severe Sepsis 2/2 sacral abscess with osteomyelitis - tachycardia, febrile, lactic acid 2.2--0.8, procal 0.79.   Gram negative bacteremia - anaerobic blood culture with gram negative rods. Drawn 11/17.   #1. Surgery (Drs. Kristen Loader, Eather Colas) following. Patient will undergo I&D on Nov 21 with Dr. Yvone Neu.  Continue vanc, Zosyn. ID - Dr. Judie Petit. Patel consulted. Wound care consulted. Blood cultures redrawn on 11/19, currently NGTD. Follow intra-op cultures.   DMT2, insulin dependent with hyperglycemia - SSI + Lantus   Hypertension, essential - did not appear to be taking amlodipine or home antihypertensives any longer. Will monitor. Added back amlodipine on 11/21.   History of CVA - on ASA, statin   Hyperlipidemia - continue statin.   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 with external bone growth stimulator placement on Dec 09, 2021  Sleep apnea noncompliant with CPAP      Diet Carb reduced    DVT Prophylax ASA 325 mg; SCDs   GI Prophylaxis Famotidine    Code status Full    Disposition Likely will be medically stable for discharge on 11/24 - will need maturation of intra-op cultures obtained 11/21.   Interested in [facility];  CM will also provide daughter with SNF choices.         Freddie Apley, DO, hospitalist   June 24, 2022

## 2022-06-24 NOTE — Op Note (Signed)
Operative Note      Patient: [name] Short  Date of Birth: 1955-09-24  MRN: [id number]    Date of Procedure: 07-22-2022    Pre-Op Diagnosis Codes:     * Pressure injury of skin of sacral region, unspecified injury stage [L89.159]    Post-Op Diagnosis: stage 4 sacral decubitus with ulcer       Procedure(s):  INCISION AND DRAINAGE /DEBRIDEMENT SACRAL DECUBITUS    Surgeon(s):  Nestor Ramp, DO    Assistant:   First Assistant: WALKER, [place]    Anesthesia: General    Estimated Blood Loss (mL): Minimal    Complications: None    Specimens:   ID Type Source Tests Collected by Time Destination   1 : Sacral decubitus Tissue Tissue CULTURE, TISSUE Nestor Ramp, DO 07-22-2022 1647    2 : sacral decubitus Tissue Tissue CULTURE, ANAEROBIC Nestor Ramp, DO 07/22/22 1647        Implants:  * No implants in log *      Drains:   External Urinary Catheter (Active)   Site Assessment Clean,dry & intact 07/22/2022 0724   Placement Replaced 22-Jul-2022 0724   Securement Method Securing device (Describe) 06/22/22 1049   Catheter Care Catheter/Wick replaced 07-22-22 0724   Perineal Care Yes 07-22-22 0724   Suction 40 mmgHg continuous Jul 22, 2022 0724   Urine Color Amber 07-22-2022 0618   Urine Appearance Clear 07-22-22 0618   Urine Odor Other (Comment) July 22, 2022 0618   Output (mL) 1000 mL 07-22-2022 0618       [REMOVED] Closed/Suction Drain Posterior;Right Neck Bulb (Removed)       [REMOVED] Negative Pressure Wound Therapy Sacrum Posterior (Removed)       [REMOVED] Urinary Catheter 12/09/21 Foley (Removed)       [REMOVED] External Urinary Catheter (Removed)       Findings: 11.0x3.0x6.5cm with right lateral tunneling 6.5cm        Detailed Description of Procedure:   After informed consent was obtained the patient was taken to the operating room and placed in the right lateral position. General anesthesia was administered by the anesthetist and titrated to effect. The sacrum was then prepped and draped in the usual sterile fashion and a time  out procedure was performed. Next using the bovie electrocautery the open wound was extended superiorly and large pus pocket was encountered. Wound cultures were obtained. Necrotic soft tissue and slough was noted to the sacrum with purulent fluid. This was debrided. A pus pocket then extended superiorly and right laterally to larger pocket. This was irrigated with irricept solution. Pinpoint bleeding was cauterized with the bovie. Measurements were obtained. Pictures were placed in the chart. The wound was then packed with quicklot and ABD pads were applied and tape. She was then extubated and tolerated the procedure well.     Electronically signed by Nestor Ramp, DO on July 22, 2022 at 7:58 PM

## 2022-06-25 ENCOUNTER — Encounter: Payer: PRIVATE HEALTH INSURANCE | Primary: Physician Assistant

## 2022-06-25 LAB — POCT GLUCOSE
POC Glucose: 227 mg/dL — ABNORMAL HIGH (ref 70–110)
POC Glucose: 302 mg/dL — ABNORMAL HIGH (ref 70–110)
POC Glucose: 367 mg/dL — ABNORMAL HIGH (ref 70–110)
POC Glucose: 395 mg/dL — ABNORMAL HIGH (ref 70–110)

## 2022-06-25 LAB — BASIC METABOLIC PANEL
Anion Gap: 3 mmol/L (ref 3.0–18)
BUN/Creatinine Ratio: 23 — ABNORMAL HIGH (ref 12–20)
BUN: 20 mg/dL — ABNORMAL HIGH (ref 7.0–18)
CO2: 25 mmol/L (ref 21–32)
Calcium: 10.2 mg/dL — ABNORMAL HIGH (ref 8.5–10.1)
Chloride: 111 mmol/L (ref 100–111)
Creatinine: 0.86 mg/dL (ref 0.6–1.3)
Est, Glom Filt Rate: >60 mL/min/{1.73_m2} (ref >60)
Glucose: 278 mg/dL — ABNORMAL HIGH (ref 74–99)
Potassium: 4.7 mmol/L (ref 3.5–5.5)
Sodium: 139 mmol/L (ref 136–145)

## 2022-06-25 MED ORDER — AMLODIPINE BESYLATE 5 MG PO TABS
5 MG | Freq: Every day | ORAL | Status: DC
Start: 2022-06-25 — End: 2022-07-04
  Administered 2022-06-25 – 2022-07-04 (×10): 5 mg via ORAL

## 2022-06-25 MED ORDER — INSULIN LISPRO 100 UNIT/ML IJ SOLN
100 UNIT/ML | Freq: Four times a day (QID) | INTRAMUSCULAR | Status: DC
Start: 2022-06-25 — End: 2022-06-25

## 2022-06-25 MED ORDER — INSULIN LISPRO 100 UNIT/ML IJ SOLN
100 UNIT/ML | Freq: Four times a day (QID) | INTRAMUSCULAR | Status: DC
Start: 2022-06-25 — End: 2022-07-04
  Administered 2022-06-25 (×2): 15 [IU] via SUBCUTANEOUS
  Administered 2022-06-26: 22:00:00 12 [IU] via SUBCUTANEOUS
  Administered 2022-06-26: 12:00:00 3 [IU] via SUBCUTANEOUS
  Administered 2022-06-26 – 2022-06-27 (×2): 15 [IU] via SUBCUTANEOUS
  Administered 2022-06-28 (×2): 3 [IU] via SUBCUTANEOUS
  Administered 2022-06-30: 22:00:00 6 [IU] via SUBCUTANEOUS
  Administered 2022-06-30: 04:00:00 3 [IU] via SUBCUTANEOUS
  Administered 2022-06-30 – 2022-07-01 (×2): 6 [IU] via SUBCUTANEOUS
  Administered 2022-07-02: 22:00:00 3 [IU] via SUBCUTANEOUS

## 2022-06-25 MED FILL — FERROUS SULFATE 325 (65 FE) MG PO TABS: 325 (65 Fe) MG | ORAL | Qty: 1

## 2022-06-25 MED FILL — NORVASC 5 MG PO TABS: 5 MG | ORAL | Qty: 1

## 2022-06-25 MED FILL — BACLOFEN 10 MG PO TABS: 10 MG | ORAL | Qty: 1

## 2022-06-25 MED FILL — INSULIN LISPRO 100 UNIT/ML IJ SOLN: 100 UNIT/ML | INTRAMUSCULAR | Qty: 6

## 2022-06-25 MED FILL — FAMOTIDINE 20 MG PO TABS: 20 MG | ORAL | Qty: 1

## 2022-06-25 MED FILL — INSULIN LISPRO 100 UNIT/ML IJ SOLN: 100 UNIT/ML | INTRAMUSCULAR | Qty: 15

## 2022-06-25 MED FILL — PIPERACILLIN SOD-TAZOBACTAM SO 3.375 (3-0.375) G IV SOLR: 3.375 (3-0.375) g | INTRAVENOUS | Qty: 3375

## 2022-06-25 MED FILL — VANCOMYCIN HCL 1 G IV SOLR: 1 g | INTRAVENOUS | Qty: 1000

## 2022-06-25 MED FILL — ATORVASTATIN CALCIUM 40 MG PO TABS: 40 MG | ORAL | Qty: 2

## 2022-06-25 MED FILL — INSULIN LISPRO 100 UNIT/ML IJ SOLN: 100 UNIT/ML | INTRAMUSCULAR | Qty: 8

## 2022-06-25 MED FILL — INSULIN GLARGINE 100 UNIT/ML SC SOLN: 100 UNIT/ML | SUBCUTANEOUS | Qty: 10

## 2022-06-25 MED FILL — CULTURELLE PO CAPS: ORAL | Qty: 1

## 2022-06-25 NOTE — Plan of Care (Signed)
Problem: Discharge Planning  Goal: Discharge to home or other facility with appropriate resources  Outcome: Progressing     Problem: Pain  Goal: Verbalizes/displays adequate comfort level or baseline comfort level  Outcome: Progressing     Problem: Skin/Tissue Integrity  Goal: Absence of new skin breakdown  Description: 1.  Monitor for areas of redness and/or skin breakdown  2.  Assess vascular access sites hourly  3.  Every 4-6 hours minimum:  Change oxygen saturation probe site  4.  Every 4-6 hours:  If on nasal continuous positive airway pressure, respiratory therapy assess nares and determine need for appliance change or resting period.  Outcome: Progressing     Problem: Safety - Adult  Goal: Free from fall injury  Outcome: Progressing     Problem: ABCDS Injury Assessment  Goal: Absence of physical injury  Outcome: Progressing     Problem: Occupational Therapy - Adult  Goal: By Discharge: Performs self-care activities at highest level of function for planned discharge setting.  See evaluation for individualized goals.  Description: Occupational Therapy Goals:  Initiated 06/21/2022, Re-evaluated s/p I &D; goals adjusted to be met within 7-10 days.    1.  Patient will perform bed mobility for ADLs and repositioning with moderate assistance.  2.  Patient will perform upper body dressing with supervision/set-up.  3.  Patient will perform functional activity at EOB with supervision/set-up, F balance.  4.  Patient will perform bathing with minimal assistance.  5.  Patient will participate in upper extremity therapeutic exercise/activities with supervision/set-up for 8 minutes to improve endurance and UB strength needed for ADLs    6.  Patient will utilize energy conservation techniques during functional activities with verbal cues.      PLOF: Pt lives with daughter, reports recently has been working with HHPT on walking, was [facility] from [facility] due to being able to complete her ADLs with set-up/ AE (sock-aid), daughter  and PCA assisting mostly with IADLs. PCA comes in intermittently during the week, daughter is pt's primary caregiver. Per pt and daughter since 1 week ago pt requires significant amount of assist for functional mobility.   06/25/2022 1246 by Newell Coral, OTR/L  Outcome: Progressing     Problem: Physical Therapy - Adult  Goal: By Discharge: Performs mobility at highest level of function for planned discharge setting.  See evaluation for individualized goals.  Description: Note: Physical Therapy Goals:  Initiated 06/21/2022 to be met within 7-10 days.     1.  Patient will move from supine to sit and sit to supine  in bed with minimal assistance/contact guard assist.    2.  Patient will transfer from bed to chair and chair to bed with minimal assistance/contact guard assist using the least restrictive device.  3.  Patient will perform sit to stand with minimal assistance/contact guard assist.  4.  Patient will ambulate with minimal assistance/contact guard assist for 15 feet with the least restrictive device.   5.  Patient will ascend/descend 3 stairs with 1 handrail(s) with moderate assistance .     PLOF: used rolling walker for ambulation around home, lives with daughter who assists with ADLs, 1 story, 3 stairs to enter with handrailing  06/25/2022 1507 by Adine Madura, PT  Note:   Note: Physical Therapy Goals:  Initiated 06/21/2022 to be met within 7-10 days.     1.  Patient will move from supine to sit and sit to supine  in bed with minimal assistance/contact guard assist.  2.  Patient will transfer from bed to chair and chair to bed with minimal assistance/contact guard assist using the least restrictive device.  3.  Patient will perform sit to stand with minimal assistance/contact guard assist.  4.  Patient will ambulate with minimal assistance/contact guard assist for 15 feet with the least restrictive device.   5.  Patient will ascend/descend 3 stairs with 1 handrail(s) with moderate assistance .      PLOF: used rolling walker for ambulation around home, lives with daughter who assists with ADLs, 1 story, 3 stairs to enter with handrailing  06/25/2022 1505 by Adine Madura, PT  Outcome: Progressing     Problem: Chronic Conditions and Co-morbidities  Goal: Patient's chronic conditions and co-morbidity symptoms are monitored and maintained or improved  Outcome: Progressing     Problem: Nutrition Deficit:  Goal: Optimize nutritional status  Outcome: Progressing

## 2022-06-25 NOTE — Progress Notes (Signed)
Progress Note  Hospitalist Service    Patient: [name] Short MRN: [id number]   SSN: [id number]  Date of Birth: 1955/11/22   Age: 66 y.o.  Sex: female      Admit Date: 06/20/2022    LOS: 5 days   Chief Complaint   Patient presents with    Fatigue       Subjective:         Pt seen with daughter and therapists at bedside.    Objective:     Vitals:  BP (!) 179/69   Pulse 79   Temp 98.7 F (37.1 C) (Oral)   Resp 18   Ht 1.499 m (4' 11.02")   Wt 81.1 kg (178 lb 12.7 oz)   SpO2 98%   BMI 36.09 kg/m     Physical Exam:   General: alert, awake, in NAD  HEENT: NCAT, sclerae anicteric,  mmm, neck supple  COR: rrr, no murmurs  PULM: CTAB  ABD: soft, nt, nd  EXT: no edema  NEURO: follows commands, no gross focal abnormalities, speech normal, no tremors  PSYCH: non agitated      Intake and Output:  Current Shift: No intake/output data recorded.  Last three shifts: 11/20 1901 - 11/22 0700  In: 440 [P.O.:240; I.V.:200]  Out: 1800 [Urine:1800]    Lab/Data Review:  Recent Results (from the past 12 hour(s))   Basic Metabolic Panel    Collection Time: 06/25/22  4:21 AM   Result Value Ref Range    Sodium 139 136 - 145 mmol/L    Potassium 4.7 3.5 - 5.5 mmol/L    Chloride 111 100 - 111 mmol/L    CO2 25 21 - 32 mmol/L    Anion Gap 3 3.0 - 18 mmol/L    Glucose 278 (H) 74 - 99 mg/dL    BUN 20 (H) 7.0 - 18 MG/DL    Creatinine 0.86 0.6 - 1.3 MG/DL    Bun/Cre Ratio 23 (H) 12 - 20      Est, Glom Filt Rate >60 >60 ml/min/1.11m2    Calcium 10.2 (H) 8.5 - 10.1 MG/DL   POCT Glucose    Collection Time: 06/25/22  8:03 AM   Result Value Ref Range    POC Glucose 302 (H) 70 - 110 mg/dL   POCT Glucose    Collection Time: 06/25/22 11:32 AM   Result Value Ref Range    POC Glucose 367 (H) 70 - 110 mg/dL         Key Findings or tests:       Telemetry NONE   Oxygen NONE     Assessment and Plan:   Patient admitted from home on 11/17 secondary to infection of stage IV decubitus sacral ulcer. She was receiving wound care at home but noticed increased pain  and drainage. Patient underwent I&D and debridement with Dr. Bing Ree ([place]) on 11/21. Intra-op cultures were obtained. Dr. Posey Pronto ([place]) following for antibiotic recs.   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 -->resolved  Gram negative bacteremia - anaerobic blood culture with gram negative rods. Drawn 11/17.   #1. Surgery (Drs. Forest Gleason, Criselda Peaches) following. Recommendation made for wound vac to sacrum, wound vac placed.  Continue vanc, Zosyn. ID - Dr. Jerilynn Mages. Patel consulted. Wound care consulted. Blood cultures redrawn on 11/19, currently NGTD. Follow intra-op cultures.   DMT2, insulin dependent with hyperglycemia - SSI + Lantus   Hypertension, essential - did not appear to be taking  amlodipine or home antihypertensives any longer. Will monitor. Added back amlodipine on 11/21.   History of CVA - on ASA, statin   Hyperlipidemia - continue statin.   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 with external bone growth stimulator placement on Dec 09, 2021   Sleep apnea noncompliant with CPAP      Diet Carb reduced    DVT Prophylax ASA 325 mg; SCDs   GI Prophylaxis Famotidine    Code status Full    Disposition Likely will be medically stable for discharge on 11/24 - will need maturation of intra-op cultures obtained 11/21.   Interested in ARU;  CM will also provide daughter with SNF choices.         Aurther Loft, MD, hospitalist   June 25, 2022

## 2022-06-25 NOTE — Progress Notes (Signed)
POD# 1    Admit Date: 06/20/2022    Assessment    [name] Short is a 66 y.o. female POD 1 s/p incision and drainage/debridement of sacral decubitus stage 4    Patient Active Problem List   Diagnosis    Iron deficiency anemia due to chronic blood loss    Diabetes (HCC)    Occult blood in stools    Stroke (HCC)    Symptomatic anemia    Cervical myelopathy (HCC)    Obstructive sleep apnea syndrome    Spinal stenosis of lumbar region    Syncope    Cerebrovascular accident ([place])    Failure to thrive in adult    Skin ulcer of sacrum ([place])    RUQ pain    Decubitus ulcer of sacral region, stage 4 ([place])    Lacunar infarct, acute ([place])    Acute CVA (cerebrovascular accident) ([place])    Sepsis ([place])    Severe sepsis ([place])    Hypertension    History of CVA (cerebrovascular accident)    Hyperlipidemia    Sacral osteomyelitis ([place])       Plan  -recommend wound vac to sacrum  -awaiting culture from surgery     Subjective    Overnight events: No acute events overnight      Objective    Physical Exam:   BP (!) 170/71   Pulse 73   Temp 98.4 F (36.9 C) (Oral)   Resp 18   Ht 1.499 m (4' 11.02")   Wt 81.1 kg (178 lb 12.7 oz)   SpO2 97%   BMI 36.09 kg/m     Intake/Output Summary (Last 24 hours) at 06/25/2022 0826  Last data filed at 06/24/2022 1654  Gross per 24 hour   Intake 200 ml   Output --   Net 200 ml     Physical Exam  Skin:     General: Skin is warm and dry.      Comments: Sacral wound packed with quicklot, no purulent drainage or foul odor noted   Psychiatric:         Mood and Affect: Mood normal.         Behavior: Behavior normal.         Thought Content: Thought content normal.         Judgment: Judgment normal.               Nestor Ramp, DO  Phone: [phone]

## 2022-06-25 NOTE — Plan of Care (Addendum)
Problem: Physical Therapy - Adult  Goal: By Discharge: Performs mobility at highest level of function for planned discharge setting.  See evaluation for individualized goals.  Description: Note: Physical Therapy Goals:  Initiated 06/21/2022 to be met within 7-10 days.     1.  Patient will move from supine to sit and sit to supine  in bed with minimal assistance/contact guard assist.    2.  Patient will transfer from bed to chair and chair to bed with minimal assistance/contact guard assist using the least restrictive device.  3.  Patient will perform sit to stand with minimal assistance/contact guard assist.  4.  Patient will ambulate with minimal assistance/contact guard assist for 15 feet with the least restrictive device.   5.  Patient will ascend/descend 3 stairs with 1 handrail(s) with moderate assistance .     PLOF: used rolling walker for ambulation around home, lives with daughter who assists with ADLs, 1 story, 3 stairs to enter with handrailing  06/25/2022 1505 by Adine Madura, PT  Outcome: Progressing   PHYSICAL THERAPY TREATMENT    Patient: [name] Short (66 y.o. female)  Date: 06/25/2022  Diagnosis: Septicemia ([place]) [A41.9]  Sepsis ([place]) [A41.9] Severe sepsis ([place])  Procedure(s) (LRB):  INCISION AND DRAINAGE /DEBRIDEMENT SACRAL DECUBITUS (N/A) 1 Day Post-Op  Precautions: Fall Risk, sacral wound stage 4    ASSESSMENT:  Pt cleared by nursing to participate in PT.  Pt is s/p I & D to sacral wound on 06/24/22.  Pt seen with OT to maximize functional mobility and to maximize safety.  Pt participated in bed mobility with mod to max A and additional time, and transfers sit to stand with mod A x 2 people from edge of bed to RW.  Pt needs increased time to complete tasks and is hindered by pain in her knees/legs and muscle spasms.  Pt stood a 2nd time and took 3-4 small steps laterally at side of bed to get closer to [facility].  Pt started crying and noted pain in both knees with standing and stepping. Pt then  returned to supine in bed and positioned on right side with wedges behind her to keep off sacrum.      Progression toward goals:   _0       Improving appropriately and progressing toward goals  _1       Improving slowly and progressing toward goals  _2       Not making progress toward goals and plan of care will be adjusted     PLAN:  Patient continues to benefit from skilled intervention to address the above impairments.  Continue treatment per established plan of care.    Further Equipment Recommendations for Discharge: rolling walker and wheelchair      AMPAC: AM-PAC Inpatient Mobility Raw Score : 8      Current research shows that an AM-PAC score of 17 (13 without stairs) or less is not associated with a discharge to the patient's home setting. Based on an AM-PAC score and their current functional mobility deficits, it is recommended that the patient have 3-5 sessions per week of Physical Therapy at d/c to increase the patient's independence.     This AMPAC score should be considered in conjunction with interdisciplinary team recommendations to determine the most appropriate discharge setting. Patient's social support, diagnosis, medical stability, and prior level of function should also be taken into consideration.     SUBJECTIVE:   Patient stated, "I have spasms in my legs and I have pain  in my legs." Pt agreeable to participate in PT.  During taking steps, pt began crying and stated "my knees hurt so bad"    OBJECTIVE DATA SUMMARY:   Critical Behavior:  Orientation  Overall Orientation Status: Within Normal Limits  Orientation Level: Oriented X4       Functional Mobility Training:  Bed Mobility:  Bed Mobility Training  Bed Mobility Training: Yes  Rolling: Moderate assistance  Supine to Sit: Maximum assistance  Sit to Supine: Maximum assistance  Scooting: Maximum assistance;Assist X2  Transfers:  Biomedical engineer Training: Yes  Sit to Stand: Moderate assistance;Assist X2  Stand to Sit: Minimum  assistance;Assist X1  Balance:  Balance  Sitting: Impaired  Sitting - Static: Fair (occasional)  Sitting - Dynamic: Fair (occasional)  Standing: Impaired  Standing - Static: Fair (with RW support)  Standing - Dynamic: Fair ((-); with RW support)     Ambulation/Gait Training:     Gait  Overall Level of Assistance: Minimum assistance;Assist X2;Additional time  Distance (ft): 3 Feet (3-4 small steps laterally Left toward [facility])  Assistive Device: Walker, rolling;Gait belt  Interventions: Manual cues;Weight shifting training/pressure relief;Verbal cues;Safety awareness training  Base of Support: Widened  Speed/Cadence: Shuffled;Slow  Gait Abnormalities: Decreased step clearance;Step to gait    Therapeutic Exercises:     EXERCISE   Sets   Reps   Active Active Assist   Passive Self ROM   Comments   Ankle Pumps 1 5  _0  _1  _2  _3     Quad Sets/Glut Sets    _4  _5  _6  _7  Hold for 5 secs   Hamstring Sets   _8  _9  _10  _11     Short Arc Quads   _12  _13  _14  _15     Heel Slides 1 5 _16  _17  _18  _19  Slow pace due to pain/muscle spasms   Straight Leg Raises   _20  _21  _22  _23     Hip Add   _24  _25  _26  _27  Hold for 5 secs, w/ pillow squeeze   Long Arc Quads   _28  _29  _30  _31     Seated Marching   _32  _33  _34  _35     Standing Marching   _36  _37  _38  _39        _40  _41  _42  _43         Pain:  Pain level pre-treatment: 6/10  Pain level post-treatment: 6/10   Pain Intervention(s): Rest, Repositioning   Response to intervention: Nurse notified    Activity Tolerance:   Activity Tolerance: Patient limited by pain;Patient limited by endurance;Treatment limited secondary to medical complications (muscle spasms in LE's)  Please refer to the flowsheet for vital signs taken during this treatment.  After treatment:   _44  Patient left in no apparent distress sitting up in chair  _45  Patient left in no apparent distress in bed  _46  Call bell left within reach  _47  Nursing notified  _48  Daugher present  _49  Bed alarm activated  _50  SCDs applied      COMMUNICATION/EDUCATION:   Patient  Education  Education Given To: Patient;Family  Education Provided: Print production planner;Fall Prevention Strategies (safety awareness training; mobility training)  Education Method: Demonstration;Verbal;Teach Back  Barriers to Learning: None  Education Outcome: Verbalized understanding;Continued education needed      [place], PT  Minutes: 34

## 2022-06-25 NOTE — Progress Notes (Signed)
Infectious Disease progress Note        Reason:sepsis, sacral abscess/osteomyelitis, gram negative bloodstream infection    Current abx Prior abx   Zosyn, vancomycin since 06/20/22      Lines:       Assessment :  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  Status post I&D on 06/24/2022.  Intraoperative findings noted.  Necrotic soft tissue/slough noted in the sacrum with purulent fluid.  Pus pocket extended superiorly and laterally to the larger pocket.    IntraOp cultures 11/21-no growth till date     Cervical spondylotic myelopathy: s/p  C3, C4, C5, C6 laminectomy; posterior cervical fusion C3, C4, C5, C6; segmental instrumentation, [employer] type, C3, C4, C5, C6 with lateral mass screws on 12/09/21        Recommendations:     Continue zosyn, vancomycin   Follow up surgery recommendations regarding sacral wound care  Patient will need outpatient IV antibiotic upon discharge  Follow-up IntraOp cultures.  Modify antibiotics accordingly  Monitor clinically           Above plan was discussed in details with patient, daughter at bedside and primary team. Please call me if any further questions or concerns. Will continue to participate in the care of this patient.    HPI:    No new  complaints      Past Medical History:   Diagnosis Date    Anemia     Arthritis     Chronic pain     Legs and Shoulders    Diabetes (HCC)     Exposure to asbestos     History of blood transfusion     History of colon polyps     HTN (hypertension)     Hx of blood clots     DVT    Hyperlipemia     Menopause     Pulmonary nodule     Serum calcium elevated     Sleep apnea     not using cpap    Stroke [facility])        Past Surgical History:   Procedure Laterality Date    CESAREAN SECTION  1987    COLONOSCOPY N/A 01/13/2017    COLONOSCOPY performed by Levan Hurst, MD at [facility]OSCOPY    COLONOSCOPY      DILATION AND CURETTAGE OF UTERUS  1995    RECTAL SURGERY N/A 12/28/2021    SACRAL WOUND DEBRIDEMENT INCISION AND DRAINAGE performed by Phineas Semen, DO at [facility] MAIN OR    SPINE SURGERY N/A 12/09/2021    CERVICAL THREE/FOUR/FIVE/SIX LAMINECTOMY FUSION; C-ARM; STRYKER; EXT BONE GROWTH STIM; 23 HR performed by Pamalee Leyden, MD at [facility] MAIN OR       @    Current Facility-Administered Medications   Medication  Dose Route Frequency    insulin lispro (HUMALOG) injection vial 0-12 Units  0-12 Units SubCUTAneous 4x Daily AC & HS    lactated ringers IV soln infusion   IntraVENous Continuous    lactated ringers IV soln infusion   IntraVENous Continuous    amLODIPine (NORVASC) tablet 5 mg  5 mg Oral Daily    0.9 % sodium chloride infusion   IntraVENous PRN    lactobacillus (CULTURELLE) capsule 1 capsule  1 capsule Oral Daily with breakfast    vancomycin (VANCOCIN) 1,000 mg in sodium chloride 0.9 % 250 mL (vial-mate) IVPB  1,000 mg IntraVENous Q18H    sodium chloride flush 0.9 % injection 5-40 mL  5-40 mL IntraVENous 2 times per day    sodium chloride flush 0.9 % injection 5-40 mL  5-40 mL IntraVENous PRN    potassium chloride (KLOR-CON M) extended release tablet 40 mEq  40 mEq Oral PRN    Or    potassium bicarb-citric acid (EFFER-K) effervescent tablet 40 mEq  40 mEq Oral PRN    Or    potassium chloride 10 mEq/100 mL IVPB  (Peripheral Line)  10 mEq IntraVENous PRN    magnesium sulfate 2000 mg in 50 mL IVPB premix  2,000 mg IntraVENous PRN    ondansetron (ZOFRAN-ODT) disintegrating tablet 4 mg  4 mg Oral Q8H PRN    Or    ondansetron (ZOFRAN) injection 4 mg  4 mg IntraVENous Q6H PRN    polyethylene glycol (GLYCOLAX) packet 17 g  17 g Oral Daily PRN    famotidine (PEPCID) tablet 20 mg  20 mg Oral Daily    acetaminophen (TYLENOL) tablet 650 mg  650 mg Oral Q6H PRN    Or    acetaminophen (TYLENOL) suppository 650 mg  650 mg Rectal Q6H PRN    glucose chewable tablet 16 g  4 tablet Oral PRN    dextrose bolus 10% 125 mL  125 mL IntraVENous PRN    Or    dextrose bolus 10% 250 mL  250 mL IntraVENous PRN    glucagon injection 1 mg  1 mg SubCUTAneous PRN    dextrose 10 % infusion   IntraVENous Continuous PRN    piperacillin-tazobactam (ZOSYN) 3,375 mg in sodium chloride 0.9 % 50 mL IVPB (mini-bag)  3,375 mg IntraVENous Q8H    [Held by provider] heparin (porcine) injection 5,000 Units  5,000 Units SubCUTAneous 3 times per day    insulin glargine (LANTUS) injection vial 10 Units  10 Units SubCUTAneous Nightly    baclofen (LIORESAL) tablet 10 mg  10 mg Oral BID    [Held by provider] aspirin tablet 325 mg  325 mg Oral Daily    atorvastatin (LIPITOR) tablet 80 mg  80 mg Oral Nightly    ferrous sulfate (IRON 325) tablet 325 mg  325 mg Oral Daily       Allergies: Lactose    Family History   Problem Relation Age of Onset    Diabetes Other 61        parent, NOS    Cancer Father     Hypertension Other 35        parent,NOS    Heart Disease Other 62        parent, NOS    Lung Disease Father      Social History     Socioeconomic History    Marital status: Single     Spouse name: Not on file    Number of children: Not  on file    Years of education: Not on file    Highest education level: Not on file   Occupational History    Not on file   Tobacco Use    Smoking status: Former     Packs/day: 1     Types: Cigarettes, Cigars     Quit date: 11/13/2021     Years  since quitting: 0.6    Smokeless tobacco: Never   Vaping Use    Vaping Use: Never used   Substance and Sexual Activity    Alcohol use: Not Currently     Comment: occ    Drug use: Never    Sexual activity: Not on file   Other Topics Concern    Not on file   Social History Narrative    Not on file     Social Determinants of Health     Financial Resource Strain: Low Risk  (01/05/2022)    Overall Financial Resource Strain (CARDIA)     Difficulty of Paying Living Expenses: Not hard at all   Food Insecurity: No Food Insecurity (01/05/2022)    Hunger Vital Sign     Worried About Running Out of Food in the Last Year: Never true     Ran Out of Food in the Last Year: Never true   Transportation Needs: No Transportation Needs (02/09/2022)    OASIS A1250: Transportation     Lack of Transportation (Medical): No     Lack of Transportation (Non-Medical): No     Patient Unable or Declines to Respond: No   Physical Activity: Inactive (01/05/2022)    Exercise Vital Sign     Days of Exercise per Week: 0 days     Minutes of Exercise per Session: 0 min   Stress: No Stress Concern Present (01/05/2022)    [employer] of Occupational Health - Occupational Stress Questionnaire     Feeling of Stress : Not at all   Social Connections: Feeling Socially Integrated (02/09/2022)    OASIS D0700: Social Isolation     Frequency of experiencing loneliness or isolation: Never   Recent Concern: Social Connections - Moderately Isolated (01/05/2022)    Social Connection and Isolation Panel [NHANES]     Frequency of Communication with Friends and Family: Three times a week     Frequency of Social Gatherings with Friends and Family: More than three times a week     Attends Religious Services: More than 4 times per year     Active Member of Clubs or Organizations: No     Attends Banker Meetings: Never     Marital Status: Widowed   Intimate Partner Violence: Not At Risk (01/05/2022)    Humiliation, Afraid, Rape, and Kick questionnaire     Fear of Current  or Ex-Partner: No     Emotionally Abused: No     Physically Abused: No     Sexually Abused: No   Housing Stability: Low Risk  (01/05/2022)    Housing Stability Vital Sign     Unable to Pay for Housing in the Last Year: No     Number of Places Lived in the Last Year: 1     Unstable Housing in the Last Year: No     Social History     Tobacco Use   Smoking Status Former    Packs/day: 1    Types: Cigarettes, Cigars    Quit date: 11/13/2021    Years since quitting: 0.6   Smokeless Tobacco  Never        Temp (24hrs), Avg:98.7 F (37.1 C), Min:97 F (36.1 C), Max:99.7 F (37.6 C)    BP (!) 170/71   Pulse 73   Temp 98.4 F (36.9 C) (Oral)   Resp 18   Ht 1.499 m (4' 11.02")   Wt 81.1 kg (178 lb 12.7 oz)   SpO2 97%   BMI 36.09 kg/m     ROS: 12 point ROS obtained in details. Pertinent positives as mentioned in HPI,   otherwise negative    Physical Exam:    General: NAD, appears stated age, alert, comfortable    Eyes: PERRL, sclera is non-icteric  HENT: normocephalic/atraumatic, moist mucus membranes  Respiratory: CTA with no signs of respiratory distress  Cardiovascular: S1S2 regular, no cyanosis + 1-2 BLE ankle edema  GI: soft, non-tender, normal bowel sounds  Neuro: moves all extremities, no focal deficits, normal speech  Psych: appropriate mood and affect, no visual or auditory hallucinations  Skin: stage 4 sacral ulcer with red granulation tissue- no purulence, fluctuant tender swelling proximal to sacral ulcer       Labs: Results:   Chemistry Recent Labs     06/23/22  0405 06/25/22  0421   GLUCOSE 268* 278*   NA 141 139   K 3.9 4.7   CL 111 111   CO2 25 25   BUN 16 20*   CREATININE 0.95 0.86        CBC w/Diff Recent Labs     06/23/22  0405   WBC 11.4   RBC 3.70*   HGB 8.5*   HCT 27.5*   PLT 354        Microbiology Results       Procedure Component Value Units Date/Time    Culture, Anaerobic [[id number]] Collected: 06/24/22 1647    Order Status: Sent Specimen: Tissue Updated: 06/24/22 2252    Culture, Tissue  [[id number]] Collected: 06/24/22 1647    Order Status: Completed Specimen: Tissue Updated: 06/25/22 0022     Special Requests SACRAL DECUBITUS     Gram stain OCCASIONAL WBCS SEEN         NO ORGANISMS SEEN        Culture PENDING    Culture, Blood 1 [[id number]] Collected: 06/22/22 0433    Order Status: Completed Specimen: Blood Updated: 06/25/22 0638     Special Requests NO SPECIAL REQUESTS        Culture NO GROWTH 3 DAYS       Culture, Blood 2 [[id number]] Collected: 06/22/22 0433    Order Status: Completed Specimen: Blood Updated: 06/25/22 0638     Special Requests NO SPECIAL REQUESTS        Culture NO GROWTH 3 DAYS       Culture, Wound Aerobic Only [[id number]] Collected: 06/20/22 2230    Order Status: Canceled Specimen: Sacrum     Blood Culture 2 [[id number]] Collected: 06/20/22 1820    Order Status: Completed Specimen: Blood Updated: 06/25/22 0638     Special Requests NO SPECIAL REQUESTS        Culture NO GROWTH 5 DAYS       Respiratory Panel, Molecular, with COVID-19 (Restricted: peds pts or suitable admitted adults) [[id number]] Collected: 06/20/22 1820    Order Status: Completed Specimen: Nasopharyngeal Updated: 06/20/22 1926     Adenovirus by PCR Not detected        Coronavirus 229E by PCR Not detected        Coronavirus HKU1 by PCR Not detected  Coronavirus NL63 by PCR Not detected        Coronavirus OC43 by PCR Not detected        SARS-CoV-2, PCR Not detected        Human Metapneumovirus by PCR Not detected        Rhinovirus Enterovirus PCR Not detected        Influenza A by PCR Not detected        Influenza B PCR Not detected        Parainfluenza 1 PCR Not detected        Parainfluenza 2 PCR Not detected        Parainfluenza 3 PCR Not detected        Parainfluenza 4 PCR Not detected        Respiratory Syncytial Virus by PCR Not detected        Bordetella parapertussis by PCR Not detected        Bordetella pertussis by PCR Not detected        Chlamydophila Pneumonia PCR Not detected        Mycoplasma  pneumo by PCR Not detected       Culture, Blood, PCR ID Panel [[id number]]  (Abnormal) Collected: 06/20/22 1820    Order Status: Completed Specimen: Blood Updated: 06/21/22 1848     Accession Number [id number]     Enterococcus faecalis by PCR Not detected        Enterococcus faecium by PCR Not detected        Listeria monocytogenes by PCR Not detected        STAPHYLOCOCCUS Not detected        Staphylococcus Aureus Not detected        Staphylococcus epidermidis by PCR Not detected        Staphylococcus lugdunensis by PCR Not detected        STREPTOCOCCUS Not detected        Streptococcus agalactiae (Group B) Not detected        Strep pneumoniae Not detected        Strep pyogenes,(Grp. A) Not detected        Acinetobacter calcoac baumannii complex by PCR Not detected        Bacteroides fragilis by PCR Detected        Enterobacteriaceae by PCR Not detected        Enterobacter cloacae complex by PCR Not detected        Escherichia Coli Not detected        Klebsiella aerogenes by PCR Not detected        Klebsiella oxytoca by PCR Not detected        Klebsiella pneumoniae group by PCR Not detected        Proteus by PCR Not detected        Salmonella species by PCR Not detected        Serratia marcescens by PCR Not detected        Haemophilus Influenzae by PCR Not detected        Neisseria meningitidis by PCR Not detected        Pseudomonas aeruginosa Not detected        Stenotrophomonas maltophilia by PCR Not detected        Candida albicans by PCR Not detected        Candida auris by PCR Not detected        Candida glabrata Not detected        Candida krusei by PCR Not detected  Candida parapsilosis by PCR Not detected        Candida tropicalis by PCR Not detected        Cryptococcus neoformans/gattii by PCR Not detected        Resistant gene targets          [employer] test comment       False positive results may rarely occur. Correlate with clinical,epidemiologic, and other laboratory findings           Comment:  Please see BC[facility] Interpretation Guide in EPIC Links       Blood Culture 1 [[id number]]  (Abnormal) Collected: 06/20/22 1800    Order Status: Completed Specimen: Blood Updated: 06/23/22 1031     Special Requests NO SPECIAL REQUESTS        Gram stain       ANAEROBIC BOTTLE Gram negative rods                  SMEAR CALLED TO AND CORRECTLY REPEATED BY: RN W BARKER SVTSD ON [id number] AT 1836 HRS TO 1396           Culture       Bacteroides fragilis BETA LACTAMASE POSITIVE GROWING IN 1 OF 2 BOTTLES DRAWN SITE=RFA                      RADIOLOGY:    All available imaging studies/reports in connect care for this admission were reviewed    High complexity decision making was performed during the evaluation of this patient at high risk for decompensation with multiple organ involvement     Above mentioned total time spent on reviewing the case/medical record/data/notes/EMR/patient examination/documentation/coordinating care with nurse/consultants, exclusive of procedures with complex decision making performed and > 50% time spent in face to face evaluation.       Disclaimer: Sections of this note are dictated utilizing voice recognition software, which may have resulted in some phonetic based errors in grammar and contents. Even though attempts were made to correct all the mistakes, some may have been missed, and remained in the body of the document. If questions arise, please contact our department.    Dr. Raiford Simmonds, Infectious Disease Specialist  [phone]  June 25, 2022  10:00 AM

## 2022-06-25 NOTE — Plan of Care (Signed)
Problem: Occupational Therapy - Adult  Goal: By Discharge: Performs self-care activities at highest level of function for planned discharge setting.  See evaluation for individualized goals.  Description: Occupational Therapy Goals:  Initiated 06/21/2022, Re-evaluated s/p I &D; goals adjusted to be met within 7-10 days.    1.  Patient will perform bed mobility for ADLs and repositioning with moderate assistance.  2.  Patient will perform upper body dressing with supervision/set-up.  3.  Patient will perform functional activity at EOB with supervision/set-up, F balance.  4.  Patient will perform bathing with minimal assistance.  5.  Patient will participate in upper extremity therapeutic exercise/activities with supervision/set-up for 8 minutes to improve endurance and UB strength needed for ADLs    6.  Patient will utilize energy conservation techniques during functional activities with verbal cues.      PLOF: Pt lives with daughter, reports recently has been working with HHPT on walking, was [facility] from [facility] due to being able to complete her ADLs with set-up/ AE (sock-aid), daughter and PCA assisting mostly with IADLs. PCA comes in intermittently during the week, daughter is pt's primary caregiver. Per pt and daughter since 1 week ago pt requires significant amount of assist for functional mobility.     Outcome: Progressing      OCCUPATIONAL THERAPY RE-EVALUATION    Patient: [name] Short (66 y.o. female)  Date: 06/25/2022  Primary Diagnosis: Septicemia ([place]) [A41.9]  Sepsis ([place]) [A41.9]  Procedure(s) (LRB):  INCISION AND DRAINAGE /DEBRIDEMENT SACRAL DECUBITUS (N/A) 1 Day Post-Op   Precautions: Fall Risk    ASSESSMENT :  Patient cleared to participate in OT re-evaluation s/p I & D by RN. Upon entering the room, the patient was supine in bed with wedges on L, alert, and agreeable to participate in OT session with daughter present. Patient was seen with PT to maximize patient safety, participation, and functional  mobility in preparation for self-care tasks. Patient total assist for donning socks, moderate assist for rolling, max assist for supine > sit and moderate assist x 2 for sit > stand. Patient able to take lateral steps towards [facility], verbal cues required for placement of hands/ feet during functional transfers and physical assistance with maneuvering RW. Patient with lateral lean to R observed sitting and standing, cues needed. Patient tearful with functional mobility and reports no pain relief. Left in bed with all needs met, call bell in reach and RN notified of session.       DEFICITS/IMPAIRMENTS:  Performance deficits / Impairments: Decreased functional mobility ;Decreased ADL status;Decreased strength;Decreased safe awareness;Decreased cognition;Decreased endurance;Decreased balance;Decreased coordination;Decreased posture;Decreased ROM    Patient will benefit from skilled intervention to address the above impairments.  Patient's rehabilitation potential/Prognosis: Fair.  Factors which may influence rehabilitation potential include:   _0              None noted  _1              Mental ability/status  _2              Medical condition  _3              Home/family situation and support systems  _4              Safety awareness  _5              Pain tolerance/management  _6              Other:      PLAN :  Recommendations and Planned Interventions:   [  x]               Self Care Training                  _0       Therapeutic Activities  _1                Functional Mobility Training   _2       Cognitive Retraining  _3                Therapeutic Exercises           _4       Endurance Activities  _5                Balance Training                    _6       Neuromuscular Re-Education  _7                Visual/Perceptual Training     _8       Home Safety Training  _9                Patient Education                   _10       Family Training/Education  _11                Other (comment):    Frequency/Duration: Patient will be  followed by occupational therapy to address goals, 1-2 times per day/3-5 days per week to address goals.    Further Equipment Recommendations for Discharge: hospital bed, BSC, tub transfer bench    AMPAC: AM-PAC Inpatient Daily Activity Raw Score: 14     Current research shows that an AM-PAC score of 17 or less is not associated with a discharge to the patient's home setting.  Based on an AM-PAC score and their current ADL deficits; it is recommended that the patient have 3-5 sessions per week of Occupational Therapy at d/c to increase the patient's independence.      This AMPAC score should be considered in conjunction with interdisciplinary team recommendations to determine the most appropriate discharge setting. Patient's social support, diagnosis, medical stability, and prior level of function should also be taken into consideration.     SUBJECTIVE:   Patient stated "I get spasms" and "It hurts" tearfully    OBJECTIVE DATA SUMMARY:   Hospital course since last seen and reason for re-evaluation: Patient seen s/p I&D. OT will continue to follow the patient for further intervention during this hospitalization, in order to maximize ADL performance and overall functional independence.     Past Medical History:   Diagnosis Date    Anemia     Arthritis     Chronic pain     Legs and Shoulders    Diabetes (HCC)     Exposure to asbestos     History of blood transfusion     History of colon polyps     HTN (hypertension)     Hx of blood clots     DVT    Hyperlipemia     Menopause     Pulmonary nodule     Serum calcium elevated     Sleep apnea     not using cpap    Stroke [facility])      Past Surgical History:   Procedure Laterality Date    BACK SURGERY N/A 06/24/2022    INCISION AND  DRAINAGE /DEBRIDEMENT SACRAL DECUBITUS performed by Nestor Ramp, DO at [place]    COLONOSCOPY N/A 01/13/2017    COLONOSCOPY performed by Joycelyn Schmid, MD at [place] N/A 12/28/2021    SACRAL WOUND DEBRIDEMENT INCISION AND DRAINAGE performed by Nestor Ramp, DO at [place] N/A 12/09/2021    CERVICAL THREE/FOUR/FIVE/SIX LAMINECTOMY FUSION; C-ARM; STRYKER; EXT BONE GROWTH STIM; 23 HR performed by Candi Leash, MD at [place] Situation:   Social/Functional History  Lives With: Daughter  Type of Home: House  Home Layout: One level  Home Access: Stairs to enter with rails  Entrance Stairs - Number of Steps: 3  Bathroom Toilet: Standard  Bathroom Equipment: Civil engineer, contracting, [employer]mode  Home Equipment: Environmental consultant, rolling, [employer], [employer] Help From: Family  ADL Assistance: Needs assistance  Ambulation Assistance: Needs assistance  Transfer Assistance: Needs assistance  Active Driver: No  Occupation: Unemployed  _0   Right hand dominant   _1   Left hand dominant    Cognitive/Behavioral Status:  Orientation  Overall Orientation Status: Within Normal Limits  Orientation Level: Oriented X4    Skin: Intact  Edema: None noted    Vision/Perceptual:    Vision  Vision: Within Functional Limits         Coordination: BUE  Coordination: Generally decreased, functional    Balance:  Balance  Sitting: Impaired  Sitting - Static: Fair (occasional)  Sitting - Dynamic: Fair (occasional);Occasional  Standing: Impaired  Standing - Static: Fair  Standing - Dynamic: Fair;Occasional    Strength: BUE  Strength: Generally decreased, functional    Tone & Sensation: BUE  Tone: Normal    Range of Motion: BUE  AROM: Generally decreased, functional    Functional Mobility and Transfers for ADLs:  Bed Mobility:  Bed Mobility Training  Bed Mobility Training: Yes  Rolling: Moderate assistance  Supine to Sit: Maximum assistance  Sit to Supine: Maximum assistance  Scooting: Maximum assistance;Assist X2 (towards [facility])    Transfers:  Transfer Training  Transfer Training: Yes  Sit to Stand: Moderate assistance;Assist X2  Stand to Sit:  Minimum assistance;Assist X1    ADL Assessment:   Feeding: Supervision;Setup  Grooming: Stand by assistance  UE Bathing: Minimal assistance  LE Bathing: Maximum assistance  UE Dressing: Minimal assistance  LE Dressing: Maximum assistance  Toileting: Dependent/Total    Pain:  Pain level pre-treatment: 6/10 , buttocks and BLE  Pain level post-treatment: 6/10   Pain Intervention(s): Rest, Ice, Repositioning   Response to intervention: Nurse notified    Activity Tolerance:   Activity Tolerance: Patient limited by pain  Please refer to the flowsheet for vital signs taken during this treatment.    After treatment:   _2  Patient left in no apparent distress sitting up in chair  _3  Patient left in no apparent distress in bed  _4  Call bell left within reach  _5  Nursing notified  _6  Caregiver present  _7  Bed alarm activated    COMMUNICATION/EDUCATION:   Patient Education  Education Given To: Patient;Family  Education Provided: Role of Therapy;Plan of Care;Energy Conservation;ADL Adaptive Strategies;Transfer Training;Equipment;Home Exercise Program;Fall Prevention Strategies;Precautions;Family Education  Education Method: Verbal;Teach Back;Demonstration  Barriers to Learning: None  Education Outcome: Continued education needed    Thank you for this referral.  Newell Coral, OTR/L  Minutes: [phone]

## 2022-06-25 NOTE — Progress Notes (Signed)
1935  Bedside and Verbal shift change report given to Linzie Boursiquot,RN (Soil scientist) by Junie Panning, RN (offgoing nurse). Report included the following information Nurse Handoff Report, MAR, and Quality Measures.

## 2022-06-25 NOTE — Progress Notes (Signed)
Pt using home unit

## 2022-06-25 NOTE — Consults (Addendum)
Room #: 960      HAR: [id number]      Situation: Wound Care Consult    Background:    PMH:   Active Ambulatory Problems     Diagnosis Date Noted    Iron deficiency anemia due to chronic blood loss 12/21/2016    Diabetes ([place])     Occult blood in stools 12/21/2016    Stroke ([place]) 04/23/2013    Symptomatic anemia 12/20/2016    Cervical myelopathy ([place]) 09/23/2021    Obstructive sleep apnea syndrome 09/23/2021    Spinal stenosis of lumbar region 09/23/2021    Syncope 09/08/2021    Cerebrovascular accident ([place]) 09/23/2021    Failure to thrive in adult 12/27/2021    Skin ulcer of sacrum ([place]) 12/28/2021    RUQ pain 12/28/2021    Decubitus ulcer of sacral region, stage 4 ([place]) 12/28/2021    Lacunar infarct, acute ([place]) 12/31/2021    Acute CVA (cerebrovascular accident) ([place]) 01/06/2022    Sepsis ([place]) 06/20/2022     Resolved Ambulatory Problems     Diagnosis Date Noted    Fall 12/27/2021     Past Medical History:   Diagnosis Date    Anemia     Arthritis     Chronic pain     Exposure to asbestos     History of blood transfusion     History of colon polyps     HTN (hypertension)     Hx of blood clots     Hyperlipemia     Menopause     Pulmonary nodule     Serum calcium elevated     Sleep apnea         Braden Score: 15/23   BMI: Body mass index is 36.09 kg/m.   Preventive measures in place: limited layers, Purewick, off loading wedges, heel boots    Assessment:   Patient found  reclined with wedges to left side .  Patient is Awake and alert, Oriented x person, place, time and situation, and Pleasant and conversant. Daughter at bedside and is very helpful.      Wound(s) Description:           Negative Pressure Wound Therapy Sacrum (Active)   $ Standard NPWT >50 sq cm PER TX $ Yes 06/25/22 1338   Wound Type Pressure ulcer: Stage IV 06/25/22 1338   Dressing Type Black Foam;Non adherent contact layer 06/25/22 1338   Number of pieces used 1 06/25/22 1338   Cycle Continuous 06/25/22 1338   Target Pressure (mmHg) 125 06/25/22  1338   Canister changed? Yes 06/25/22 1338   Dressing Status New dressing applied 06/25/22 1338   Dressing Changed Changed/New 06/25/22 1338   Drainage Amount Large 06/25/22 1338   Drainage Description Serosanguinous 06/25/22 1338   Dressing Change Due 06/25/22 06/25/22 1338   Number of days: 0       Wound 12/16/21 Sacrum (Active)   Wound Image   06/25/22 1336   Wound Etiology Pressure Stage 4 06/25/22 1336   Dressing Status New dressing applied 06/25/22 1336   Wound Cleansed Cleansed with saline 06/25/22 1336   Dressing/Treatment Negative pressure wound therapy 06/25/22 1336   Offloading for Diabetic Foot Ulcers Offloading boot 06/22/22 2000   Dressing Change Due 06/24/22 06/24/22 1658   Wound Length (cm) 13 cm 06/25/22 1336   Wound Width (cm) 5 cm 06/25/22 1336   Wound Depth (cm) 5.5 cm 06/25/22 1336   Wound Surface Area (cm^2) 65 cm^2 06/25/22 1336  Change in Wound Size % (l*w) -3.17 06/25/22 1336   Wound Volume (cm^3) 357.5 cm^3 06/25/22 1336   Wound Healing % -89 06/25/22 1336   Distance Tunneling (cm) 5 cm 06/21/22 1130   Tunneling Position ___ O'Clock 12 06/21/22 1130   Wound Assessment Granulation tissue 06/25/22 1336   Drainage Amount Large (50-75% saturated) 06/25/22 1336   Drainage Description Sanguinous 06/25/22 1336   Odor None 06/25/22 1336   Peri-wound Assessment Intact;Hyperpigmented 06/25/22 1336   Margins Flat/open edges 06/21/22 1130   Wound Thickness Description not for Pressure Injury Full thickness 06/25/22 1336   Number of days: 191       Wound 06/25/22 Buttocks Left (Active)   Wound Image   06/25/22 1337   Wound Etiology Traumatic 06/25/22 1337   Dressing/Treatment Open to air 06/25/22 1337   Wound Length (cm) 3 cm 06/25/22 1337   Wound Width (cm) 0.1 cm 06/25/22 1337   Wound Surface Area (cm^2) 0.3 cm^2 06/25/22 1337   Wound Assessment Erythema;Fluid filled blister 06/25/22 1337   Drainage Amount None (dry) 06/25/22 1337   Odor None 06/25/22 1337   Peri-wound Assessment Intact 06/25/22 1337    Number of days: 0                Recommendations:    Wound care orders as follows: For Hospital: Unit nurse to provide wound vac dressing to sacrum every Monday, Wednesday and Friday and PRN. Clean with wound spray then apply vac.  Settings continuous.  **Use Adaptice/Oil emulsion/Curad to cover base, use ostomy barrier ring just above rectum to enhance your seal** Upon discharge from [facility], place hospital machine in dirty utility room under white laminated sign. May apply daily saline dressing if home wound vac unavailable.    For Home Health: Skilled nurse to perform [facility] dressing change to sacrum  3 times weekly and prn seal breach or vac malfunction. May soak dressing in saline prior to removal if necessary. Cleanse with NS or wound cleanser and apply Wound Vac at 125 - 175 mmHG  continuous suction. May titrate up or down by  to obtain seal, for increased or decreased amount of drainage. May apply prn non adherent dressing to protect tendons, ligaments, bone, areas not intended for granulation. Use white foam for all tunnels and undermining or areas where wound base is not easily visualized. When using white foam, increase setting to or greater. May apply NS wet to dry dressing prn wound vac problems. Instruct pt/caregiver regarding procedures in case of wound vac problems.       Supplies Used: vac dressing, vac cannister, gauze, Adaptic         Care discussed with primary nurse, Erin RN.   CM Dana notified of wound vac application and patient will need home vac therapy.  Daughter reports patient still has home wound vac machine from previous admission.  Care turned over to nursing staff at this time.    [name] Short. Cobbs BSN, RN, [facility], CLIN IV  [facility] Wound Care Dept  Office: [phone]  Work Cell: [phone]

## 2022-06-25 NOTE — Plan of Care (Signed)
Problem: Discharge Planning  Goal: Discharge to home or other facility with appropriate resources  Outcome: Progressing     Problem: Pain  Goal: Verbalizes/displays adequate comfort level or baseline comfort level  Outcome: Progressing     Problem: Skin/Tissue Integrity  Goal: Absence of new skin breakdown  Description: 1.  Monitor for areas of redness and/or skin breakdown  2.  Assess vascular access sites hourly  3.  Every 4-6 hours minimum:  Change oxygen saturation probe site  4.  Every 4-6 hours:  If on nasal continuous positive airway pressure, respiratory therapy assess nares and determine need for appliance change or resting period.  Outcome: Progressing     Problem: Safety - Adult  Goal: Free from fall injury  Outcome: Progressing     Problem: ABCDS Injury Assessment  Goal: Absence of physical injury  Outcome: Progressing     Problem: Chronic Conditions and Co-morbidities  Goal: Patient's chronic conditions and co-morbidity symptoms are monitored and maintained or improved  Outcome: Progressing     Problem: Nutrition Deficit:  Goal: Optimize nutritional status  Outcome: Progressing

## 2022-06-25 NOTE — Unmapped (Signed)
Code Status: Full Code

## 2022-06-26 LAB — CULTURE, ANAEROBIC

## 2022-06-26 LAB — CULTURE, BLOOD 2: Culture: NO GROWTH

## 2022-06-26 LAB — POCT GLUCOSE
POC Glucose: 416 mg/dL (ref 70–110)
POC Glucose: 372 mg/dL — ABNORMAL HIGH (ref 70–110)
POC Glucose: 281 mg/dL — ABNORMAL HIGH (ref 70–110)
POC Glucose: 184 mg/dL — ABNORMAL HIGH (ref 70–110)
POC Glucose: 168 mg/dL — ABNORMAL HIGH (ref 70–110)
POC Glucose: 146 mg/dL — ABNORMAL HIGH (ref 70–110)
POC Glucose: 343 mg/dL — ABNORMAL HIGH (ref 70–110)
POC Glucose: 360 mg/dL — ABNORMAL HIGH (ref 70–110)

## 2022-06-26 MED ORDER — INSULIN LISPRO 100 UNIT/ML IJ SOLN
100 UNIT/ML | Freq: Once | INTRAMUSCULAR | Status: AC
Start: 2022-06-26 — End: 2022-06-26
  Administered 2022-06-26: 06:00:00 8 [IU] via SUBCUTANEOUS

## 2022-06-26 MED FILL — NORVASC 5 MG PO TABS: 5 MG | ORAL | Qty: 1

## 2022-06-26 MED FILL — ONDANSETRON HCL 4 MG/2ML IJ SOLN: 4 MG/2ML | INTRAMUSCULAR | Qty: 2

## 2022-06-26 MED FILL — FERROUS SULFATE 325 (65 FE) MG PO TABS: 325 (65 Fe) MG | ORAL | Qty: 1

## 2022-06-26 MED FILL — ACETAMINOPHEN 325 MG PO TABS: 325 MG | ORAL | Qty: 2

## 2022-06-26 MED FILL — ATORVASTATIN CALCIUM 40 MG PO TABS: 40 MG | ORAL | Qty: 2

## 2022-06-26 MED FILL — INSULIN LISPRO 100 UNIT/ML IJ SOLN: 100 UNIT/ML | INTRAMUSCULAR | Qty: 15

## 2022-06-26 MED FILL — CULTURELLE PO CAPS: ORAL | Qty: 1

## 2022-06-26 MED FILL — INSULIN LISPRO 100 UNIT/ML IJ SOLN: 100 UNIT/ML | INTRAMUSCULAR | Qty: 12

## 2022-06-26 MED FILL — INSULIN GLARGINE 100 UNIT/ML SC SOLN: 100 UNIT/ML | SUBCUTANEOUS | Qty: 10

## 2022-06-26 MED FILL — PIPERACILLIN SOD-TAZOBACTAM SO 3.375 (3-0.375) G IV SOLR: 3.375 (3-0.375) g | INTRAVENOUS | Qty: 3375

## 2022-06-26 MED FILL — VANCOMYCIN HCL 1 G IV SOLR: 1 g | INTRAVENOUS | Qty: 1000

## 2022-06-26 MED FILL — BACLOFEN 10 MG PO TABS: 10 MG | ORAL | Qty: 1

## 2022-06-26 MED FILL — INSULIN LISPRO 100 UNIT/ML IJ SOLN: 100 UNIT/ML | INTRAMUSCULAR | Qty: 8

## 2022-06-26 MED FILL — INSULIN LISPRO 100 UNIT/ML IJ SOLN: 100 UNIT/ML | INTRAMUSCULAR | Qty: 3

## 2022-06-26 MED FILL — FAMOTIDINE 20 MG PO TABS: 20 MG | ORAL | Qty: 1

## 2022-06-26 NOTE — Progress Notes (Addendum)
Progress Note  Hospitalist Service    Patient: [name] Short MRN: [id number]   SSN: [id number]  Date of Birth: 1955/11/25   Age: 66 y.o.  Sex: female      Admit Date: 06/20/2022    LOS: 6 days   Chief Complaint   Patient presents with    Fatigue       Subjective:         Pt reports she feels tired. Has c/o some back pain. Appeared comfortable.     Objective:     Vitals:  BP (!) 152/69   Pulse 76   Temp 98.4 F (36.9 C) (Oral)   Resp 18   Ht 1.499 m (4' 11.02")   Wt 81.1 kg (178 lb 12.7 oz)   SpO2 97%   BMI 36.09 kg/m     Physical Exam:   General: alert, awake, in NAD  HEENT: NCAT, sclerae anicteric,  mmm, neck supple  COR: rrr, no murmurs  PULM: CTAB  ABD: soft, nt, nd  EXT: no edema  NEURO: follows commands, no gross focal abnormalities, speech normal, no tremors  PSYCH: non agitated      Intake and Output:  Current Shift: No intake/output data recorded.  Last three shifts: 11/22 0701 - 11/23 1900  In: 1600 [P.O.:400; I.V.:1200]  Out: 1300 [Urine:1300]    Lab/Data Review:  Recent Results (from the past 12 hour(s))   POCT Glucose    Collection Time: 06/26/22 11:19 AM   Result Value Ref Range    POC Glucose 146 (H) 70 - 110 mg/dL   POCT Glucose    Collection Time: 06/26/22  4:12 PM   Result Value Ref Range    POC Glucose 360 (H) 70 - 110 mg/dL   POCT Glucose    Collection Time: 06/26/22  4:14 PM   Result Value Ref Range    POC Glucose 343 (H) 70 - 110 mg/dL         Key Findings or tests:       Telemetry NONE   Oxygen NONE     Assessment and Plan:   Patient admitted from home on 11/17 secondary to infection of stage IV decubitus sacral ulcer. She was receiving wound care at home but noticed increased pain and drainage. Patient underwent I&D and debridement with Dr. Bing Ree ([place]) on 11/21. Intra-op cultures were obtained. Dr. Posey Pronto (ID) following for antibiotic recs.   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 -->resolved. Remains on vanc and  zosyn. ID recommends outpt IV abx, and placement of PICC line.  Bacteroides bacteremia, wound infection -  blood culture gram . Drawn 11/17.   #1. Surgery (Drs. Forest Gleason, Criselda Peaches) following. Recommendation made for wound vac to sacrum, wound vac placed.  Continue vanc, Zosyn. ID - Dr. Jerilynn Mages. Patel consulted. Wound care consulted. Blood cultures redrawn on 11/19, currently NGTD. Follow intra-op cultures.   DMT2, insulin dependent with hyperglycemia - SSI + Lantus   Hypertension, essential - did not appear to be taking amlodipine or home antihypertensives any longer. Will monitor. Added back amlodipine on 11/21.   History of CVA - on ASA, statin   Hyperlipidemia - continue statin.   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 with external bone growth stimulator placement on Dec 09, 2021   Sleep apnea noncompliant with CPAP      Diet Carb reduced    DVT Prophylax ASA 325 mg; SCDs  GI Prophylaxis Famotidine    Code status Full    Disposition Likely will be medically stable for discharge on 11/24 - will need maturation of intra-op cultures obtained 11/21.   Interested in ARU;  CM will also provide daughter with SNF choices.         Aurther Loft, MD, hospitalist   June 26, 2022

## 2022-06-26 NOTE — Progress Notes (Signed)
Infectious Disease progress Note        Reason:sepsis, sacral abscess/osteomyelitis, gram negative bloodstream infection    Current abx Prior abx   Zosyn, vancomycin since 06/20/22      Lines:       Assessment :  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  Status post I&D on 06/24/2022.  Intraoperative findings noted.  Necrotic soft tissue/slough noted in the sacrum with purulent fluid.  Pus pocket extended superiorly and laterally to the larger pocket.    IntraOp cultures 11/21-staph aureus     Cervical spondylotic myelopathy: s/p  C3, C4, C5, C6 laminectomy; posterior cervical fusion C3, C4, C5, C6; segmental instrumentation, Stryker type, C3, C4, C5, C6 with lateral mass screws on 12/09/21        Recommendations:     Continue zosyn, vancomycin   Follow up surgery recommendations regarding sacral wound care  Patient will need outpatient IV antibiotic upon discharge.  Place PICC line for outpatient IV antibiotics  Follow-up IntraOp cultures.  Modify antibiotics accordingly  Monitor clinically           Above plan was discussed in details with patient, daughter at bedside and primary team. Please call me if any further questions or concerns. Will continue to participate in the care  of this patient.    HPI:    No new complaints      Past Medical History:   Diagnosis Date    Anemia     Arthritis     Chronic pain     Legs and Shoulders    Diabetes (HCC)     Exposure to asbestos     History of blood transfusion     History of colon polyps     HTN (hypertension)     Hx of blood clots     DVT    Hyperlipemia     Menopause     Pulmonary nodule     Serum calcium elevated     Sleep apnea     not using cpap    Stroke [facility])        Past Surgical History:   Procedure Laterality Date    BACK SURGERY N/A 06/24/2022    INCISION AND DRAINAGE /DEBRIDEMENT SACRAL DECUBITUS performed by Phineas Semen, DO at [facility] MAIN OR    CESAREAN SECTION  1987    COLONOSCOPY N/A 01/13/2017    COLONOSCOPY performed by Levan Hurst, MD at [facility] ENDOSCOPY    COLONOSCOPY      DILATION AND CURETTAGE OF UTERUS  1995    RECTAL SURGERY N/A 12/28/2021    SACRAL WOUND DEBRIDEMENT INCISION AND DRAINAGE performed by Phineas Semen, DO at [facility] MAIN OR    SPINE SURGERY N/A 12/09/2021    CERVICAL THREE/FOUR/FIVE/SIX LAMINECTOMY FUSION; C-ARM; STRYKER; EXT  BONE GROWTH STIM; 23 HR performed by Pamalee Leyden, MD at [facility] MAIN OR       @    Current Facility-Administered Medications   Medication Dose Route Frequency    insulin lispro (HUMALOG) injection vial 0-15 Units  0-15 Units SubCUTAneous 4x Daily AC & HS    lactated ringers IV soln infusion   IntraVENous Continuous    lactated ringers IV soln infusion   IntraVENous Continuous    amLODIPine (NORVASC) tablet 5 mg  5 mg Oral Daily    0.9 % sodium chloride infusion   IntraVENous PRN    lactobacillus (CULTURELLE) capsule 1 capsule  1 capsule Oral Daily with breakfast    vancomycin (VANCOCIN) 1,000 mg in sodium chloride 0.9 % 250 mL (vial-mate) IVPB  1,000 mg IntraVENous Q18H    sodium chloride flush 0.9 % injection 5-40 mL  5-40 mL IntraVENous 2 times per day    sodium chloride flush 0.9 % injection 5-40 mL  5-40 mL IntraVENous PRN    potassium chloride (KLOR-CON M) extended release  tablet 40 mEq  40 mEq Oral PRN    Or    potassium bicarb-citric acid (EFFER-K) effervescent tablet 40 mEq  40 mEq Oral PRN    Or    potassium chloride 10 mEq/100 mL IVPB (Peripheral Line)  10 mEq IntraVENous PRN    magnesium sulfate 2000 mg in 50 mL IVPB premix  2,000 mg IntraVENous PRN    ondansetron (ZOFRAN-ODT) disintegrating tablet 4 mg  4 mg Oral Q8H PRN    Or    ondansetron (ZOFRAN) injection 4 mg  4 mg IntraVENous Q6H PRN    polyethylene glycol (GLYCOLAX) packet 17 g  17 g Oral Daily PRN    famotidine (PEPCID) tablet 20 mg  20 mg Oral Daily    acetaminophen (TYLENOL) tablet 650 mg  650 mg Oral Q6H PRN    Or    acetaminophen (TYLENOL) suppository 650 mg  650 mg Rectal Q6H PRN    glucose chewable tablet 16 g  4 tablet Oral PRN    dextrose bolus 10% 125 mL  125 mL IntraVENous PRN    Or    dextrose bolus 10% 250 mL  250 mL IntraVENous PRN    glucagon injection 1 mg  1 mg SubCUTAneous PRN    dextrose 10 % infusion   IntraVENous Continuous PRN    piperacillin-tazobactam (ZOSYN) 3,375 mg in sodium chloride 0.9 % 50 mL IVPB (mini-bag)  3,375 mg IntraVENous Q8H    [Held by provider] heparin (porcine) injection 5,000 Units  5,000 Units SubCUTAneous 3 times per day    insulin glargine (LANTUS) injection vial 10 Units  10 Units SubCUTAneous Nightly    baclofen (LIORESAL) tablet 10 mg  10 mg Oral BID    [Held by provider] aspirin tablet 325 mg  325 mg Oral Daily    atorvastatin (LIPITOR) tablet 80 mg  80 mg Oral Nightly    ferrous sulfate (IRON 325) tablet 325 mg  325 mg Oral Daily       Allergies: Lactose    Family History   Problem Relation Age of Onset    Diabetes Other 74        parent, NOS    Cancer Father     Hypertension Other 35        parent,NOS    Heart Disease Other 49        parent, NOS    Lung Disease Father  Social History     Socioeconomic History    Marital status: Single     Spouse name: Not on file    Number of children: Not on file    Years of education: Not on file    Highest education level: Not  on file   Occupational History    Not on file   Tobacco Use    Smoking status: Former     Packs/day: 1     Types: Cigarettes, Cigars     Quit date: 11/13/2021     Years since quitting: 0.6    Smokeless tobacco: Never   Vaping Use    Vaping Use: Never used   Substance and Sexual Activity    Alcohol use: Not Currently     Comment: occ    Drug use: Never    Sexual activity: Not on file   Other Topics Concern    Not on file   Social History Narrative    Not on file     Social Determinants of Health     Financial Resource Strain: Low Risk  (01/05/2022)    Overall Financial Resource Strain (CARDIA)     Difficulty of Paying Living Expenses: Not hard at all   Food Insecurity: No Food Insecurity (01/05/2022)    Hunger Vital Sign     Worried About Running Out of Food in the Last Year: Never true     Ran Out of Food in the Last Year: Never true   Transportation Needs: No Transportation Needs (02/09/2022)    OASIS A1250: Transportation     Lack of Transportation (Medical): No     Lack of Transportation (Non-Medical): No     Patient Unable or Declines to Respond: No   Physical Activity: Inactive (01/05/2022)    Exercise Vital Sign     Days of Exercise per Week: 0 days     Minutes of Exercise per Session: 0 min   Stress: No Stress Concern Present (01/05/2022)    [employer] of Occupational Health - Occupational Stress Questionnaire     Feeling of Stress : Not at all   Social Connections: Feeling Socially Integrated (02/09/2022)    OASIS D0700: Social Isolation     Frequency of experiencing loneliness or isolation: Never   Recent Concern: Social Connections - Moderately Isolated (01/05/2022)    Social Connection and Isolation Panel [NHANES]     Frequency of Communication with Friends and Family: Three times a week     Frequency of Social Gatherings with Friends and Family: More than three times a week     Attends Religious Services: More than 4 times per year     Active Member of Clubs or Organizations: No     Attends Banker  Meetings: Never     Marital Status: Widowed   Intimate Partner Violence: Not At Risk (01/05/2022)    Humiliation, Afraid, Rape, and Kick questionnaire     Fear of Current or Ex-Partner: No     Emotionally Abused: No     Physically Abused: No     Sexually Abused: No   Housing Stability: Low Risk  (01/05/2022)    Housing Stability Vital Sign     Unable to Pay for Housing in the Last Year: No     Number of Places Lived in the Last Year: 1     Unstable Housing in the Last Year: No     Social History     Tobacco Use   Smoking  Status Former    Packs/day: 1    Types: Cigarettes, Cigars    Quit date: 11/13/2021    Years since quitting: 0.6   Smokeless Tobacco Never        Temp (24hrs), Avg:97.9 F (36.6 C), Min:97.3 F (36.3 C), Max:98.7 F (37.1 C)    BP (!) 154/76   Pulse 63   Temp 98.1 F (36.7 C) (Oral)   Resp 20   Ht 1.499 m (4' 11.02")   Wt 81.1 kg (178 lb 12.7 oz)   SpO2 99%   BMI 36.09 kg/m     ROS: 12 point ROS obtained in details. Pertinent positives as mentioned in HPI,   otherwise negative    Physical Exam:    General: NAD, appears stated age, alert, comfortable    Eyes: PERRL, sclera is non-icteric  HENT: normocephalic/atraumatic, moist mucus membranes  Respiratory: CTA with no signs of respiratory distress  Cardiovascular: S1S2 regular, no cyanosis + 1-2 BLE ankle edema  GI: soft, non-tender, normal bowel sounds  Neuro: moves all extremities, no focal deficits, normal speech  Psych: appropriate mood and affect, no visual or auditory hallucinations  Skin: stage 4 sacral ulcer with red granulation tissue- no purulence, fluctuant tender swelling proximal to sacral ulcer       Labs: Results:   Chemistry Recent Labs     06/25/22  0421   GLUCOSE 278*   NA 139   K 4.7   CL 111   CO2 25   BUN 20*   CREATININE 0.86        CBC w/Diff No results for input(s): "WBC", "RBC", "HGB", "HCT", "PLT" in the last 72 hours.    Invalid input(s): "GRANS", "LYMPH", "EOS"     Microbiology Results       Procedure Component Value  Units Date/Time    Culture, Anaerobic [[id number]] Collected: 06/24/22 1647    Order Status: Completed Specimen: Tissue Updated: 06/25/22 0981     Special Requests SACRAL DECUBITUS     Culture NO GROWTH THUS FAR       Culture, Tissue [[id number]]  (Abnormal) Collected: 06/24/22 1647    Order Status: Completed Specimen: Tissue Updated: 06/26/22 0850     Special Requests SACRAL DECUBITUS     Gram stain OCCASIONAL WBCS SEEN         NO ORGANISMS SEEN        Culture       RARE POSSIBLE Staphylococcus aureus          Culture, Blood 1 [[id number]] Collected: 06/22/22 0433    Order Status: Completed Specimen: Blood Updated: 06/26/22 0707     Special Requests NO SPECIAL REQUESTS        Culture NO GROWTH 4 DAYS       Culture, Blood 2 [[id number]] Collected: 06/22/22 0433    Order Status: Completed Specimen: Blood Updated: 06/26/22 0707     Special Requests NO SPECIAL REQUESTS        Culture NO GROWTH 4 DAYS       Culture, Wound Aerobic Only [[id number]] Collected: 06/20/22 2230    Order Status: Canceled Specimen: Sacrum     Blood Culture 2 [[id number]] Collected: 06/20/22 1820    Order Status: Completed Specimen: Blood Updated: 06/26/22 0707     Special Requests NO SPECIAL REQUESTS        Culture NO GROWTH 6 DAYS       Respiratory Panel, Molecular, with COVID-19 (Restricted: peds pts or suitable admitted adults) [[id number]] Collected: 06/20/22  1820    Order Status: Completed Specimen: Nasopharyngeal Updated: 06/20/22 1926     Adenovirus by PCR Not detected        Coronavirus 229E by PCR Not detected        Coronavirus HKU1 by PCR Not detected        Coronavirus NL63 by PCR Not detected        Coronavirus OC43 by PCR Not detected        SARS-CoV-2, PCR Not detected        Human Metapneumovirus by PCR Not detected        Rhinovirus Enterovirus PCR Not detected        Influenza A by PCR Not detected        Influenza B PCR Not detected        Parainfluenza 1 PCR Not detected        Parainfluenza 2 PCR Not detected         Parainfluenza 3 PCR Not detected        Parainfluenza 4 PCR Not detected        Respiratory Syncytial Virus by PCR Not detected        Bordetella parapertussis by PCR Not detected        Bordetella pertussis by PCR Not detected        Chlamydophila Pneumonia PCR Not detected        Mycoplasma pneumo by PCR Not detected       Culture, Blood, PCR ID Panel [[id number]]  (Abnormal) Collected: 06/20/22 1820    Order Status: Completed Specimen: Blood Updated: 06/21/22 1848     Accession Number [id number]     Enterococcus faecalis by PCR Not detected        Enterococcus faecium by PCR Not detected        Listeria monocytogenes by PCR Not detected        STAPHYLOCOCCUS Not detected        Staphylococcus Aureus Not detected        Staphylococcus epidermidis by PCR Not detected        Staphylococcus lugdunensis by PCR Not detected        STREPTOCOCCUS Not detected        Streptococcus agalactiae (Group B) Not detected        Strep pneumoniae Not detected        Strep pyogenes,(Grp. A) Not detected        Acinetobacter calcoac baumannii complex by PCR Not detected        Bacteroides fragilis by PCR Detected        Enterobacteriaceae by PCR Not detected        Enterobacter cloacae complex by PCR Not detected        Escherichia Coli Not detected        Klebsiella aerogenes by PCR Not detected        Klebsiella oxytoca by PCR Not detected        Klebsiella pneumoniae group by PCR Not detected        Proteus by PCR Not detected        Salmonella species by PCR Not detected        Serratia marcescens by PCR Not detected        Haemophilus Influenzae by PCR Not detected        Neisseria meningitidis by PCR Not detected        Pseudomonas aeruginosa Not detected        Stenotrophomonas maltophilia  by PCR Not detected        Candida albicans by PCR Not detected        Candida auris by PCR Not detected        Candida glabrata Not detected        Candida krusei by PCR Not detected        Candida parapsilosis by PCR Not detected         Candida tropicalis by PCR Not detected        Cryptococcus neoformans/gattii by PCR Not detected        Resistant gene targets          [employer] test comment       False positive results may rarely occur. Correlate with clinical,epidemiologic, and other laboratory findings           Comment: Please see BCID Interpretation Guide in EPIC Links       Blood Culture 1 [[id number]]  (Abnormal) Collected: 06/20/22 1800    Order Status: Completed Specimen: Blood Updated: 06/23/22 1031     Special Requests NO SPECIAL REQUESTS        Gram stain       ANAEROBIC BOTTLE Gram negative rods                  SMEAR CALLED TO AND CORRECTLY REPEATED BY: RN W BARKER SVTSD ON 18NOV23 AT 1836 HRS TO 1396           Culture       Bacteroides fragilis BETA LACTAMASE POSITIVE GROWING IN 1 OF 2 BOTTLES DRAWN SITE=RFA                      RADIOLOGY:    All available imaging studies/reports in connect care for this admission were reviewed      Disclaimer: Sections of this note are dictated utilizing voice recognition software, which may have resulted in some phonetic based errors in grammar and contents. Even though attempts were made to correct all the mistakes, some may have been missed, and remained in the body of the document. If questions arise, please contact our department.    Dr. Raiford Simmonds, Infectious Disease Specialist  [phone]  June 26, 2022  9:30 AM

## 2022-06-26 NOTE — Progress Notes (Signed)
POD# 2    Admit Date: 06/20/2022    Assessment    Reesa Gotschall is a 66 y.o. female s/p incision and drainage/debridement of sacral decubitus stage 4    Patient Active Problem List   Diagnosis    Iron deficiency anemia due to chronic blood loss    Diabetes (HCC)    Occult blood in stools    Stroke (HCC)    Symptomatic anemia    Cervical myelopathy (HCC)    Obstructive sleep apnea syndrome    Spinal stenosis of lumbar region    Syncope    Cerebrovascular accident ([place])    Failure to thrive in adult    Skin ulcer of sacrum ([place])    RUQ pain    Decubitus ulcer of sacral region, stage 4 ([place])    Lacunar infarct, acute ([place])    Acute CVA (cerebrovascular accident) ([place])    Sepsis ([place])    Severe sepsis ([place])    Hypertension    History of CVA (cerebrovascular accident)    Hyperlipidemia    Sacral osteomyelitis ([place])       Plan  -vac placed and holding good seal  -changes MWF  -follow up outpatient  -ok to resume anticoagulation  -general surgery will sign off    Subjective    Overnight events: No acute events overnight. Wound vac was placed     Review of Systems   Constitutional:  Negative for fever.       Objective    Physical Exam:   BP (!) 111/59   Pulse 72   Temp 97.5 F (36.4 C) (Axillary)   Resp 18   Ht 1.499 m (4' 11.02")   Wt 81.1 kg (178 lb 12.7 oz)   SpO2 99%   BMI 36.09 kg/m     Intake/Output Summary (Last 24 hours) at 06/26/2022 0726  Last data filed at 06/26/2022 8546  Gross per 24 hour   Intake 1600 ml   Output 1300 ml   Net 300 ml     Physical Exam  Skin:     General: Skin is warm and dry.      Comments: Wound vac with good seal SS drainage               Nestor Ramp, DO  Phone: [phone]

## 2022-06-26 NOTE — Progress Notes (Signed)
Physician Progress Note      PATIENTMARLOW, [name] Short  CSN #:                  [id number]  DOB:                       [birth year]  ADMIT DATE:       06/20/2022 5:07 PM  [place] DATE:  RESPONDING  PROVIDER #:        Nestor Ramp DO          QUERY TEXT:    Patient admitted with sepsis and a stage IV sacral decubitus ulcer. Per Op   note dated 06/24/22 documentation of debridement. To accurately reflect the   procedure performed please document if debridement was excisional or   nonexcisional and the deepest depth of tissue removed as down to and   including:    Risk Factors: Stage IV sacral decubitus ulcer  Clinical Indicators: 06/24/22 OP Note: Procedure(s): INCISION AND DRAINAGE   /DEBRIDEMENT SACRAL DECUBITUS  General anesthesia was administered by the anesthetist and titrated to effect.   The sacrum was then prepped and draped in the usual sterile fashion and a   time out procedure was performed. Next using the bovie electrocautery the open   wound was extended superiorly and large pus pocket was encountered. Wound   cultures were obtained. Necrotic soft tissue and slough was noted to the   sacrum with purulent fluid. This was debrided. A pus pocket then extended   superiorly and right laterally to larger pocket. This was irrigated with   irricept solution. Pinpoint bleeding was cauterized with the bovie.   Measurements were obtained.  Treatment: Post OP care    Thank you,  Ailene Rud RN/CCDS  [email or web address]  Options provided:  -- Nonexcisional debridement of skin  -- Excisional debridement of skin  -- Nonexcisional debridement of subcutaneous tissue  -- Excisional debridement of subcutaneous tissue  -- Nonexcisional debridement of fascia  -- Excisional debridement of fascia  -- Nonexcisional debridement of muscle  -- Excisional debridement of muscle  -- Other - I will add my own diagnosis  -- Disagree - Not applicable / Not valid  -- Disagree - Clinically unable to determine /  Unknown  -- Refer to Clinical Documentation Reviewer    PROVIDER RESPONSE TEXT:    Excisional debridement of subcutaneous tissue of sacrum was performed during   procedure on 06/24/22.    Query created by: Ailene Rud on 06/25/2022 3:02 PM      Electronically signed by:  Nestor Ramp DO 06/26/2022 7:20 AM

## 2022-06-26 NOTE — Plan of Care (Signed)
Problem: Discharge Planning  Goal: Discharge to home or other facility with appropriate resources  Outcome: Progressing     Problem: Pain  Goal: Verbalizes/displays adequate comfort level or baseline comfort level  Outcome: Progressing     Problem: Skin/Tissue Integrity  Goal: Absence of new skin breakdown  Description: 1.  Monitor for areas of redness and/or skin breakdown  2.  Assess vascular access sites hourly  3.  Every 4-6 hours minimum:  Change oxygen saturation probe site  4.  Every 4-6 hours:  If on nasal continuous positive airway pressure, respiratory therapy assess nares and determine need for appliance change or resting period.  Outcome: Progressing     Problem: Safety - Adult  Goal: Free from fall injury  Outcome: Progressing     Problem: ABCDS Injury Assessment  Goal: Absence of physical injury  Outcome: Progressing     Problem: Chronic Conditions and Co-morbidities  Goal: Patient's chronic conditions and co-morbidity symptoms are monitored and maintained or improved  Outcome: Progressing     Problem: Nutrition Deficit:  Goal: Optimize nutritional status  Outcome: Progressing

## 2022-06-26 NOTE — Consults (Signed)
Vascular access assessment completed. 4 Fr. PICC line placed in right brachial vein and confirmed with 3CG. External length is 0 cm, internal length is 35 cm and arm circumference is 35 cm. Lot # is [id number], REF# is [id number] and expiration is 01-02-2023. Local lidocaine utilized during procedure and patient did not present with any negative reactions to injection. Patient tolerated procedure well and CHG dressing applied to site. Initial dressing should be changed no later than 30 Nov. 2023.

## 2022-06-27 ENCOUNTER — Encounter: Payer: PRIVATE HEALTH INSURANCE | Primary: Physician Assistant

## 2022-06-27 LAB — POCT GLUCOSE
POC Glucose: 356 mg/dL — ABNORMAL HIGH (ref 70–110)
POC Glucose: 130 mg/dL — ABNORMAL HIGH (ref 70–110)
POC Glucose: 141 mg/dL — ABNORMAL HIGH (ref 70–110)
POC Glucose: 169 mg/dL — ABNORMAL HIGH (ref 70–110)

## 2022-06-27 MED ORDER — INSULIN GLARGINE 100 UNIT/ML SC SOLN
100 UNIT/ML | Freq: Once | SUBCUTANEOUS | Status: AC
Start: 2022-06-27 — End: 2022-06-26
  Administered 2022-06-27: 05:00:00 20 [IU] via SUBCUTANEOUS

## 2022-06-27 MED ORDER — INSULIN GLARGINE 100 UNIT/ML SC SOLN
100 UNIT/ML | Freq: Every evening | SUBCUTANEOUS | Status: AC
Start: 2022-06-27 — End: 2022-07-04
  Administered 2022-06-28 – 2022-07-04 (×7): 30 [IU] via SUBCUTANEOUS

## 2022-06-27 MED ORDER — INSULIN LISPRO 100 UNIT/ML IJ SOLN
100 UNIT/ML | Freq: Three times a day (TID) | INTRAMUSCULAR | Status: DC
Start: 2022-06-27 — End: 2022-07-04
  Administered 2022-06-27 – 2022-07-04 (×18): 7 [IU] via SUBCUTANEOUS

## 2022-06-27 MED FILL — BACLOFEN 10 MG PO TABS: 10 MG | ORAL | Qty: 1

## 2022-06-27 MED FILL — HEPARIN SODIUM (PORCINE) 5000 UNIT/ML IJ SOLN: 5000 UNIT/ML | INTRAMUSCULAR | Qty: 1

## 2022-06-27 MED FILL — PIPERACILLIN SOD-TAZOBACTAM SO 3.375 (3-0.375) G IV SOLR: 3.375 (3-0.375) g | INTRAVENOUS | Qty: 3375

## 2022-06-27 MED FILL — CULTURELLE PO CAPS: ORAL | Qty: 1

## 2022-06-27 MED FILL — INSULIN GLARGINE 100 UNIT/ML SC SOLN: 100 UNIT/ML | SUBCUTANEOUS | Qty: 10

## 2022-06-27 MED FILL — INSULIN GLARGINE 100 UNIT/ML SC SOLN: 100 UNIT/ML | SUBCUTANEOUS | Qty: 20

## 2022-06-27 MED FILL — NORVASC 5 MG PO TABS: 5 MG | ORAL | Qty: 1

## 2022-06-27 MED FILL — INSULIN LISPRO 100 UNIT/ML IJ SOLN: 100 UNIT/ML | INTRAMUSCULAR | Qty: 7

## 2022-06-27 MED FILL — VANCOMYCIN HCL 1 G IV SOLR: 1 g | INTRAVENOUS | Qty: 1000

## 2022-06-27 MED FILL — ATORVASTATIN CALCIUM 40 MG PO TABS: 40 MG | ORAL | Qty: 2

## 2022-06-27 MED FILL — FERROUS SULFATE 325 (65 FE) MG PO TABS: 325 (65 Fe) MG | ORAL | Qty: 1

## 2022-06-27 MED FILL — ASPIRIN ADULT 325 MG PO TABS: 325 MG | ORAL | Qty: 1

## 2022-06-27 MED FILL — FAMOTIDINE 20 MG PO TABS: 20 MG | ORAL | Qty: 1

## 2022-06-27 NOTE — Progress Notes (Signed)
Wound vac dressing changed.

## 2022-06-27 NOTE — Progress Notes (Signed)
ARU/IPR REFERRAL CONTACT NOTE  [facility]      Thank you for the opportunity to review this patient's case for admission to [facility] for Physical Rehabilitation.    Based on our pre-admission screening:     [ x] This patient does not meet criteria for admission to [facility] due to:    [x ] Too low level, per documentation, patient has not demonstrated tolerance for acute rehabilitation level of intensity.        Again, Thank you for this referral. Should you have any questions please do not hesitate to call.     Sincerely,  Alberteen Spindle    Clinical Liaison  [facility]  [phone]

## 2022-06-27 NOTE — Care Coordination-Inpatient (Addendum)
Writer met with patient and daughter Jiles Garter at bedside. CM explained reason ARU are unable to accept because patient requires a higher level of care. Patient and daughter understood.     Referrals sent in CC link. Patient and daughter had no preference in SNF, local facilities in [place] area.       Has wound vac, IV abx.     Luwanna Brossman, MSW  Care Management

## 2022-06-27 NOTE — Progress Notes (Signed)
[place] Pharmacokinetic Monitoring Service - Vancomycin    Indication: Sepsis of Unknown Etiology  Target Concentration: Goal AUC/MIC 400-600 mg*hr/L  Day of Therapy: 7  Additional Antimicrobials: Piperacillin/Tazobactam    Pertinent Laboratory Values:   Temp: 98.4 F (36.9 C), Weight - Scale: 81.1 kg (178 lb 12.7 oz)  Recent Labs     06/25/22  0421   CREATININE 0.86   BUN 20*       Estimated Creatinine Clearance: 64 mL/min (based on SCr of 0.86 mg/dL).    Pertinent Cultures:  Culture Date Source Results   11/17 blood bacteroides   11/19 blood ngtd   11/21 Sacral decub MRSA   MRSA Nasal Swab: N/A. Non-respiratory infection    Assessment:  Date/Time Current Dose Concentration Timing of Concentration (h) AUC   11/18 1500mg  x1  750mg  x1 - - -   11/19 1000mg  q18h 20.3 3.75 461   11/20 1000 mg q18h - - -   11/21 1000 mg q18h  12.3 13.5 477   11/23 1000mg  q18h      11/24 1000mg  q18h   452     Note: Serum concentrations collected for AUC dosing may appear elevated if collected in close proximity to the dose administered, this is not necessarily an indication of toxicity    Plan:  Current dosing regimen is therapeutic  Continue Vancomycin 1000mg  q18h  BMP for tomorrow AM   Vancomycin concentration to be repeated as indicated  Pharmacy will continue to monitor patient and adjust therapy as indicated    Thank you for the consult,  Mellissa Kohut, [facility]  06/27/2022

## 2022-06-27 NOTE — Progress Notes (Signed)
Progress Note  Hospitalist Service    Patient: [name] Short MRN: [id number]   SSN: [id number]  Date of Birth: 1955-11-26   Age: 66 y.o.  Sex: female      Admit Date: 06/20/2022    LOS: 7 days   Chief Complaint   Patient presents with    Fatigue       Subjective:   Pt has no complaints.    Objective:     Vitals:  BP (!) 148/65   Pulse 73   Temp 98.3 F (36.8 C) (Oral)   Resp 18   Ht 1.499 m (4' 11.02")   Wt 81.1 kg (178 lb 12.7 oz)   SpO2 98%   BMI 36.09 kg/m     Physical Exam:   General: alert, awake, in NAD  HEENT: NCAT, sclerae anicteric,  mmm, neck supple  COR: rrr, no murmurs  PULM: CTAB  ABD: soft, nt, nd  EXT: no edema  NEURO: follows commands, no gross focal abnormalities, speech normal, no tremors  PSYCH: non agitated      Intake and Output:  Current Shift: No intake/output data recorded.  Last three shifts: 11/23 0701 - 11/24 1900  In: 360 [P.O.:360]  Out: 2350 [Urine:2200; Drains:150]    Lab/Data Review:  Recent Results (from the past 12 hour(s))   POCT Glucose    Collection Time: 06/27/22 11:29 AM   Result Value Ref Range    POC Glucose 141 (H) 70 - 110 mg/dL   POCT Glucose    Collection Time: 06/27/22  3:32 PM   Result Value Ref Range    POC Glucose 169 (H) 70 - 110 mg/dL   POCT Glucose    Collection Time: 06/27/22 10:04 PM   Result Value Ref Range    POC Glucose 147 (H) 70 - 110 mg/dL         Key Findings or tests:       Telemetry NONE   Oxygen NONE     Assessment and Plan:   Patient admitted from home on 11/17 secondary to infection of stage IV decubitus sacral ulcer. She was receiving wound care at home but noticed increased pain and drainage. Patient underwent I&D and debridement with Dr. Yvone Neu (GS) on 11/21. Intra-op cultures were obtained. Dr. Allena Katz (ID) following for antibiotic recs.   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 -->resolved. Remains on vanc and zosyn. ID recommends outpt IV abx, s/p PICC  line.  Bacteroides bacteremia, wound infection -  blood culture Drawn 11/17.   #1. Surgery (Drs. Kristen Loader, Eather Colas) following. Recommendation made for wound vac to sacrum, wound vac placed.  Continue vanc, Zosyn. Wound care consulted. Blood cultures redrawn on 11/19, NGTD. intra-op cultures-->MRSA (prelim/antigen detection).   DMT2, insulin dependent with hyperglycemia - SSI + Lantus   Hypertension, essential - did not appear to be taking amlodipine or home antihypertensives any longer. Will monitor. Added back amlodipine on 11/21.   History of CVA - on ASA, statin   Hyperlipidemia - continue statin.   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 with external bone growth stimulator placement on Dec 09, 2021   Sleep apnea noncompliant with CPAP      Diet Carb reduced    DVT Prophylax ASA 325 mg; heparin subq   GI Prophylaxis Famotidine    Code status Full    Disposition Likely will be medically stable for discharge on next 24 hrs -  will need maturation of intra-op cultures obtained 11/21.   Interested in [place];  CM will also provide daughter with SNF choices.         Bartholomew Boards, MD, hospitalist   June 27, 2022

## 2022-06-28 LAB — CULTURE, BLOOD 2: Culture: NO GROWTH

## 2022-06-28 LAB — BASIC METABOLIC PANEL
Anion Gap: 1 mmol/L — ABNORMAL LOW (ref 3.0–18)
BUN/Creatinine Ratio: 20 (ref 12–20)
BUN: 18 mg/dL (ref 7.0–18)
CO2: 30 mmol/L (ref 21–32)
Calcium: 10.1 mg/dL (ref 8.5–10.1)
Chloride: 114 mmol/L — ABNORMAL HIGH (ref 100–111)
Creatinine: 0.92 mg/dL (ref 0.6–1.3)
Est, Glom Filt Rate: >60 mL/min/{1.73_m2} (ref >60)
Glucose: 141 mg/dL — ABNORMAL HIGH (ref 74–99)
Potassium: 4 mmol/L (ref 3.5–5.5)
Sodium: 145 mmol/L (ref 136–145)

## 2022-06-28 LAB — CBC
Hematocrit: 27.4 % — ABNORMAL LOW (ref 35.0–45.0)
Hemoglobin: 8.2 g/dL — ABNORMAL LOW (ref 12.0–16.0)
MCH: 23.2 pg — ABNORMAL LOW (ref 24.0–34.0)
MCHC: 29.9 g/dL — ABNORMAL LOW (ref 31.0–37.0)
MCV: 77.4 FL — ABNORMAL LOW (ref 78.0–100.0)
MPV: 8.4 FL — ABNORMAL LOW (ref 9.2–11.8)
Nucleated RBCs: 0.3 /100{WBCs} — ABNORMAL HIGH
Platelets: 324 10*3/uL (ref 135–420)
RBC: 3.54 M/uL — ABNORMAL LOW (ref 4.20–5.30)
RDW: 20.8 % — ABNORMAL HIGH (ref 11.6–14.5)
WBC: 6.9 10*3/uL (ref 4.6–13.2)
nRBC: 0.02 10*3/uL — ABNORMAL HIGH (ref 0.00–0.01)

## 2022-06-28 LAB — CULTURE, TISSUE (WITH GRAM STAIN): Gram Stain: NONE SEEN

## 2022-06-28 LAB — POCT GLUCOSE
POC Glucose: 147 mg/dL — ABNORMAL HIGH (ref 70–110)
POC Glucose: 134 mg/dL — ABNORMAL HIGH (ref 70–110)
POC Glucose: 183 mg/dL — ABNORMAL HIGH (ref 70–110)
POC Glucose: 152 mg/dL — ABNORMAL HIGH (ref 70–110)

## 2022-06-28 LAB — CULTURE, BLOOD 1: Culture: NO GROWTH

## 2022-06-28 MED FILL — HEPARIN SODIUM (PORCINE) 5000 UNIT/ML IJ SOLN: 5000 UNIT/ML | INTRAMUSCULAR | Qty: 1

## 2022-06-28 MED FILL — INSULIN LISPRO 100 UNIT/ML IJ SOLN: 100 UNIT/ML | INTRAMUSCULAR | Qty: 7

## 2022-06-28 MED FILL — PIPERACILLIN SOD-TAZOBACTAM SO 3.375 (3-0.375) G IV SOLR: 3.375 (3-0.375) g | INTRAVENOUS | Qty: 3375

## 2022-06-28 MED FILL — INSULIN GLARGINE 100 UNIT/ML SC SOLN: 100 UNIT/ML | SUBCUTANEOUS | Qty: 30

## 2022-06-28 MED FILL — ATORVASTATIN CALCIUM 40 MG PO TABS: 40 MG | ORAL | Qty: 2

## 2022-06-28 MED FILL — VANCOMYCIN HCL 1 G IV SOLR: 1 g | INTRAVENOUS | Qty: 1000

## 2022-06-28 MED FILL — NORVASC 5 MG PO TABS: 5 MG | ORAL | Qty: 1

## 2022-06-28 MED FILL — FAMOTIDINE 20 MG PO TABS: 20 MG | ORAL | Qty: 1

## 2022-06-28 MED FILL — ACETAMINOPHEN 325 MG PO TABS: 325 MG | ORAL | Qty: 2

## 2022-06-28 MED FILL — ASPIRIN ADULT 325 MG PO TABS: 325 MG | ORAL | Qty: 1

## 2022-06-28 MED FILL — BACLOFEN 10 MG PO TABS: 10 MG | ORAL | Qty: 1

## 2022-06-28 MED FILL — CULTURELLE PO CAPS: ORAL | Qty: 1

## 2022-06-28 MED FILL — FERROUS SULFATE 325 (65 FE) MG PO TABS: 325 (65 Fe) MG | ORAL | Qty: 1

## 2022-06-28 NOTE — Progress Notes (Signed)
[place] Pharmacokinetic Monitoring Service - Vancomycin    Indication: sepsis  Goal AUC/MIC: 400-600 mg*hr/L  Day of Therapy: 8  Additional Antimicrobials: pip-tazo    Pertinent Laboratory Values:   Wt Readings from Last 1 Encounters:   06/22/22 81.1 kg (178 lb 12.7 oz)     Temp Readings from Last 1 Encounters:   06/28/22 97.4 F (36.3 C) (Oral)     Estimated Creatinine Clearance: 60 mL/min (based on SCr of 0.92 mg/dL).    Recent Labs     06/28/22  0330   CREATININE 0.92   BUN 18   WBC 6.9     Pertinent Cultures:  Date Source Results   11/17 blood Bacteroides in 1/2   11/19 blood NGF   11/21 wound MRSA, CoNS   MRSA Nasal Swab: NA    Assessment:  Date Current Dose Level (mg/L) Timing of Level (h) AUC/MIC   11/18 1,500 mg x1  750 mg x1 - - -   11/19 1,000 mg q18h 20.3 4 461   11/20 1,000 mg q18h - - -   11/21 1,000 mg q18h 12.3 13 477   11/22 1,000 mg q18h - - -   11/23 1,000 mg q18h - - -   11/24 1,000 mg q18h - - -   11/25 1,000 mg q18h - - -   Note: Serum concentrations collected for AUC dosing may appear elevated if collected in close proximity to the dose administered, this is not necessarily an indication of toxicity    Plan:  Continue current dose of 1,000 mg q18h  Ordered a level for 11/26 with AM labs  Pharmacy will continue to monitor patient and adjust therapy as indicated    Thank you for the consult,  Rolley Sims, [facility]  06/28/2022

## 2022-06-28 NOTE — Plan of Care (Signed)
Problem: Pain  Goal: Verbalizes/displays adequate comfort level or baseline comfort level  06/28/2022 2011 by Ovid Curd, RN  Outcome: Progressing  06/28/2022 1028 by Beryl Meager, RN  Outcome: Progressing     Problem: Skin/Tissue Integrity  Goal: Absence of new skin breakdown  06/28/2022 2011 by Ovid Curd, RN  Outcome: Progressing  06/28/2022 1028 by Beryl Meager, RN  Outcome: Progressing     Problem: Safety - Adult  Goal: Free from fall injury  06/28/2022 2011 by Ovid Curd, RN  Outcome: Progressing  06/28/2022 1028 by Beryl Meager, RN  Outcome: Progressing     Problem: Chronic Conditions and Co-morbidities  Goal: Patient's chronic conditions and co-morbidity symptoms are monitored and maintained or improved  06/28/2022 2011 by Ovid Curd, RN  Outcome: Progressing  06/28/2022 1028 by Beryl Meager, RN  Outcome: Progressing

## 2022-06-28 NOTE — Progress Notes (Signed)
06/28/22 0008   NIV Type   NIV Started/Stopped Off   Settings/Measurements   Patient's Home Machine Yes (type/vendor)     Patient has home CPAP at bedside. Not on at this time.

## 2022-06-28 NOTE — Progress Notes (Signed)
Hospitalist Progress Note    NAME:  [name] Short   DOB:   1956/01/05   MRN:   [id number]     Date/Time:  06/28/2022  Subjective:   Chief Complaint:    Pt has no cp no sob;       Past Med History and Social history reviewed. No changes.   [x]   Current Medication list and allergies reviewed*    Objective:   Vitals:  BP (!) 167/81   Pulse 66   Temp 97.6 F (36.4 C) (Oral)   Resp 20   Ht 1.499 m (4' 11.02")   Wt 81.1 kg (178 lb 12.7 oz)   SpO2 97%   BMI 36.09 kg/m   Temp (24hrs), Avg:97.9 F (36.6 C), Min:97.5 F (36.4 C), Max:98.4 F (36.9 C)      Last 24hr Input/Output:    Intake/Output Summary (Last 24 hours) at 06/28/2022 1349  Last data filed at 06/28/2022 1226  Gross per 24 hour   Intake 240 ml   Output 3575 ml   Net -3335 ml        PHYSICAL EXAM:  Gen:  []   WD [x]   WN  []   cachectic  []   thin  []   obese  []   disheveled             []    Critically ill or  []   Chronically ill appearing    HEENT:   []    red/pink conjunctivae []   pale conjunctivae                  PERRL  []   yes  []   no        hearing intact to voice []   yes  []   No               [x]   moist or  []   dry  Mucosa  NECK:   supple []   yes  []   no      masses []   yes  []   No               thyroid    []   non tender []   tender    RESP:   use of accessory muscles []   yes [x]   no                BS    [x]   clear  []   wheezing []   rhonchi []   crackles   CARD:  murmur  []   yes [x]   no   []   thrill  []   carotid bruits                 LE edema []   yes  []   no    []   JVD present    ABD:    tenderness []   yes [x]   no   Liver/spleen enlargement []    yes []    no                distended []    yes [x]   no    bowel sound []   normal []   hypo or  []   hyperactive    MSKL:   cyanosis/clubbing []   yes []   no         []    Spastic []   Flaccid []   Cog wheeling      NEUR:   []   cranial nerves intact       []   L []   R weakness    []   follows commands     []    aphasic    [x]   alert  []   lethargic  []   stuporous  []   sedated             Alert and Oriented  []   time  []    place   []   person  PSYCH:   insight []   poor []   good     mood []   depressed []   anxious []   agitated                   []   Telemetry Reviewed     []   NSR []   PAC/PVCs   []   Afib  []   Paced   []   NSVT   []   Foley []   NGT  []   Intubated on vent    Lab Data Reviewed:  No components found for: "GLPOC"  Recent Labs     06/28/22  0330   NA 145   K 4.0   CL 114*   CO2 30   BUN 18   WBC 6.9   HGB 8.2*   HCT 27.4*   PLT 324        []     I have personally reviewed the   []   xray  []   CT scan    Assessment/Plan:     Principal Problem:    Severe sepsis (HCC)  Active Problems:    Obstructive sleep apnea syndrome    Diabetes (HCC)    Decubitus ulcer of sacral region, stage 4 (HCC)    Hypertension    History of CVA (cerebrovascular accident)    Hyperlipidemia    Sacral osteomyelitis (HCC)  Resolved Problems:    * No resolved hospital problems. *     ___________________________________________________  PLAN:  Risk of deterioration:  []   Low    []   Moderate  []   High       Patient admitted from home on 11/17 secondary to infection of stage IV decubitus sacral ulcer. She was receiving wound care at home but noticed increased pain and drainage. Patient underwent I&D and debridement with Dr. Yvone NeuYeshtokin (GS) on 11/21. Intra-op cultures were obtained. Dr. Allena KatzPatel (ID) following for antibiotic recs.     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 -->resolved. Remains on vanc and zosyn. ID recommends outpt IV abx, s/p PICC line.    Bacteroides bacteremia, wound infection -  blood culture Drawn 11/17.   #1. Surgery (Drs. Kristen LoaderYouseff, Eather ColasYeshotkin) following. Recommendation made for wound vac to sacrum, wound vac placed.  Continue vanc, Zosyn. Wound care consulted. Blood cultures redrawn on 11/19, NGTD. intra-op cultures-->MRSA (prelim/antigen detection).     Repeat b/c x 2;    DMT2, insulin dependent with hyperglycemia - SSI + Lantus     Hypertension, essential - did not appear to  be taking amlodipine or home antihypertensives any longer. Will monitor. Added back amlodipine on 11/21.     History of CVA - on ASA, statin     Hyperlipidemia - continue statin.     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 with external bone growth stimulator placement on Dec 09, 2021     Sleep apnea noncompliant with CPAP    __________________________________________________    Total time spent with patient:     minutes    []   Critical Care time:      minutes    Care  Plan discussed with:    [x]   Patient   []   Family    []   Care Manager  []   Nursing   []   Consultant/Specialist :      []     >50% of visit spent in counseling and coordination of care   (Discussed []   CODE status,  []   Care Plan, []   D/C Planning)    Prophylaxis:  []   Lovenox  []   Coumadin  [x]   Hep SQ  []   SCD's  []   H2B/PPI    Disposition:  []   Home w/ Family   []   HH PT,OT,RN   [x]   SNF/LTC   []   SAH/Rehab  ___________________________________________________    Hospitalist: , MD     ___________________________________________________    *Medications reviewed:  Current Facility-Administered Medications   Medication Dose Route Frequency    insulin glargine (LANTUS) injection vial 30 Units  30 Units SubCUTAneous Nightly    insulin lispro (HUMALOG) injection vial 7 Units  7 Units SubCUTAneous TID WC    insulin lispro (HUMALOG) injection vial 0-15 Units  0-15 Units SubCUTAneous 4x Daily AC & HS    lactated ringers IV soln infusion   IntraVENous Continuous    lactated ringers IV soln infusion   IntraVENous Continuous    amLODIPine (NORVASC) tablet 5 mg  5 mg Oral Daily    0.9 % sodium chloride infusion   IntraVENous PRN    lactobacillus (CULTURELLE) capsule 1 capsule  1 capsule Oral Daily with breakfast    vancomycin (VANCOCIN) 1,000 mg in sodium chloride 0.9 % 250 mL (vial-mate) IVPB  1,000 mg IntraVENous Q18H    sodium chloride flush 0.9 % injection 5-40 mL  5-40 mL IntraVENous 2 times per day     sodium chloride flush 0.9 % injection 5-40 mL  5-40 mL IntraVENous PRN    potassium chloride (KLOR-CON M) extended release tablet 40 mEq  40 mEq Oral PRN    Or    potassium bicarb-citric acid (EFFER-K) effervescent tablet 40 mEq  40 mEq Oral PRN    Or    potassium chloride 10 mEq/100 mL IVPB (Peripheral Line)  10 mEq IntraVENous PRN    magnesium sulfate 2000 mg in 50 mL IVPB premix  2,000 mg IntraVENous PRN    ondansetron (ZOFRAN-ODT) disintegrating tablet 4 mg  4 mg Oral Q8H PRN    Or    ondansetron (ZOFRAN) injection 4 mg  4 mg IntraVENous Q6H PRN    polyethylene glycol (GLYCOLAX) packet 17 g  17 g Oral Daily PRN    famotidine (PEPCID) tablet 20 mg  20 mg Oral Daily    acetaminophen (TYLENOL) tablet 650 mg  650 mg Oral Q6H PRN    Or    acetaminophen (TYLENOL) suppository 650 mg  650 mg Rectal Q6H PRN    glucose chewable tablet 16 g  4 tablet Oral PRN    dextrose bolus 10% 125 mL  125 mL IntraVENous PRN    Or    dextrose bolus 10% 250 mL  250 mL IntraVENous PRN    glucagon injection 1 mg  1 mg SubCUTAneous PRN    dextrose 10 % infusion   IntraVENous Continuous PRN    piperacillin-tazobactam (ZOSYN) 3,375 mg in sodium chloride 0.9 % 50 mL IVPB (mini-bag)  3,375 mg IntraVENous Q8H    heparin (porcine) injection 5,000 Units  5,000 Units SubCUTAneous 3 times per day    baclofen (LIORESAL) tablet 10 mg  10 mg  Oral BID    aspirin tablet 325 mg  325 mg Oral Daily    atorvastatin (LIPITOR) tablet 80 mg  80 mg Oral Nightly    ferrous sulfate (IRON 325) tablet 325 mg  325 mg Oral Daily

## 2022-06-28 NOTE — Plan of Care (Signed)
Problem: Discharge Planning  Goal: Discharge to home or other facility with appropriate resources  Outcome: Progressing     Problem: Pain  Goal: Verbalizes/displays adequate comfort level or baseline comfort level  Outcome: Progressing     Problem: Skin/Tissue Integrity  Goal: Absence of new skin breakdown  Description: 1.  Monitor for areas of redness and/or skin breakdown  2.  Assess vascular access sites hourly  3.  Every 4-6 hours minimum:  Change oxygen saturation probe site  4.  Every 4-6 hours:  If on nasal continuous positive airway pressure, respiratory therapy assess nares and determine need for appliance change or resting period.  Outcome: Progressing     Problem: Safety - Adult  Goal: Free from fall injury  Outcome: Progressing     Problem: ABCDS Injury Assessment  Goal: Absence of physical injury  Outcome: Progressing     Problem: Chronic Conditions and Co-morbidities  Goal: Patient's chronic conditions and co-morbidity symptoms are monitored and maintained or improved  Outcome: Progressing     Problem: Nutrition Deficit:  Goal: Optimize nutritional status  Outcome: Progressing

## 2022-06-29 LAB — POCT GLUCOSE
POC Glucose: 125 mg/dL — ABNORMAL HIGH (ref 70–110)
POC Glucose: 63 mg/dL — ABNORMAL LOW (ref 70–110)
POC Glucose: 90 mg/dL (ref 70–110)
POC Glucose: 113 mg/dL — ABNORMAL HIGH (ref 70–110)
POC Glucose: 125 mg/dL — ABNORMAL HIGH (ref 70–110)

## 2022-06-29 MED FILL — NORVASC 5 MG PO TABS: 5 MG | ORAL | Qty: 1

## 2022-06-29 MED FILL — FERROUS SULFATE 325 (65 FE) MG PO TABS: 325 (65 Fe) MG | ORAL | Qty: 1

## 2022-06-29 MED FILL — PIPERACILLIN SOD-TAZOBACTAM SO 3.375 (3-0.375) G IV SOLR: 3.375 (3-0.375) g | INTRAVENOUS | Qty: 3375

## 2022-06-29 MED FILL — HEPARIN SODIUM (PORCINE) 5000 UNIT/ML IJ SOLN: 5000 UNIT/ML | INTRAMUSCULAR | Qty: 1

## 2022-06-29 MED FILL — BACLOFEN 10 MG PO TABS: 10 MG | ORAL | Qty: 1

## 2022-06-29 MED FILL — ASPIRIN ADULT 325 MG PO TABS: 325 MG | ORAL | Qty: 1

## 2022-06-29 MED FILL — FAMOTIDINE 20 MG PO TABS: 20 MG | ORAL | Qty: 1

## 2022-06-29 MED FILL — CULTURELLE PO CAPS: ORAL | Qty: 1

## 2022-06-29 MED FILL — VANCOMYCIN HCL 1 G IV SOLR: 1 g | INTRAVENOUS | Qty: 1000

## 2022-06-29 MED FILL — ATORVASTATIN CALCIUM 40 MG PO TABS: 40 MG | ORAL | Qty: 2

## 2022-06-29 MED FILL — INSULIN GLARGINE 100 UNIT/ML SC SOLN: 100 UNIT/ML | SUBCUTANEOUS | Qty: 30

## 2022-06-29 NOTE — Plan of Care (Signed)
Problem: Discharge Planning  Goal: Discharge to home or other facility with appropriate resources  Outcome: Progressing     Problem: Pain  Goal: Verbalizes/displays adequate comfort level or baseline comfort level  Outcome: Progressing     Problem: Skin/Tissue Integrity  Goal: Absence of new skin breakdown  Description: 1.  Monitor for areas of redness and/or skin breakdown  2.  Assess vascular access sites hourly  3.  Every 4-6 hours minimum:  Change oxygen saturation probe site  4.  Every 4-6 hours:  If on nasal continuous positive airway pressure, respiratory therapy assess nares and determine need for appliance change or resting period.  Outcome: Progressing     Problem: Safety - Adult  Goal: Free from fall injury  Outcome: Progressing     Problem: ABCDS Injury Assessment  Goal: Absence of physical injury  Outcome: Progressing     Problem: Chronic Conditions and Co-morbidities  Goal: Patient's chronic conditions and co-morbidity symptoms are monitored and maintained or improved  Outcome: Progressing     Problem: Nutrition Deficit:  Goal: Optimize nutritional status  Outcome: Progressing

## 2022-06-29 NOTE — Progress Notes (Signed)
[facility] Pharmacokinetic Monitoring Service - Vancomycin    Indication: sepsis  Goal AUC/MIC: 400-600 mg*hr/L  Day of Therapy: 9  Additional Antimicrobials: pip-tazo    Pertinent Laboratory Values:   Wt Readings from Last 1 Encounters:   06/22/22 81.1 kg (178 lb 12.7 oz)     Temp Readings from Last 1 Encounters:   06/29/22 97.3 F (36.3 C) (Oral)     Estimated Creatinine Clearance: 60 mL/min (based on SCr of 0.92 mg/dL).    Recent Labs     06/28/22  0330   CREATININE 0.92   BUN 18   WBC 6.9     Pertinent Cultures:  Date Source Results   11/17 blood Bacteroides in 1/2   11/19 blood NGF   11/21 wound MRSA, CoNS   MRSA Nasal Swab: NA    Assessment:  Date Current Dose Level (mg/L) Timing of Level (h) AUC/MIC   11/18 1,500 mg x1  750 mg x1 - - -   11/19 1,000 mg q18h 20.3 4 461   11/20 1,000 mg q18h - - -   11/21 1,000 mg q18h 12.3 13 477   11/22 1,000 mg q18h - - -   11/23 1,000 mg q18h - - -   11/24 1,000 mg q18h - - -   11/25 1,000 mg q18h - - -   11/26 1,000 mg q18h - - -   Note: Serum concentrations collected for AUC dosing may appear elevated if collected in close proximity to the dose administered, this is not necessarily an indication of toxicity    Plan:  Continue current dose of 1,000 mg q18h  Level was not drawn today  Ordered a level for 11/27 with AM labs  Pharmacy will continue to monitor patient and adjust therapy as indicated    Thank you for the consult,  Rolley Sims, [facility]  06/29/2022

## 2022-06-29 NOTE — Progress Notes (Signed)
Hospitalist Progress Note    NAME:  [name] AppleHelena Buckbee   DOB:   Jan 19, 1956   MRN:   [id number]     Date/Time:  06/29/2022  Subjective:   Chief Complaint:    Pt has no cp no sob;       Past Med History and Social history reviewed. No changes.   [x]   Current Medication list and allergies reviewed*    Objective:   Vitals:  BP (!) 160/72   Pulse 61   Temp 97.3 F (36.3 C) (Oral)   Resp 20   Ht 1.499 m (4' 11.02")   Wt 81.1 kg (178 lb 12.7 oz)   SpO2 99%   BMI 36.09 kg/m   Temp (24hrs), Avg:97.6 F (36.4 C), Min:97.3 F (36.3 C), Max:98 F (36.7 C)      Last 24hr Input/Output:    Intake/Output Summary (Last 24 hours) at 06/29/2022 1517  Last data filed at 06/28/2022 1906  Gross per 24 hour   Intake --   Output 1300 ml   Net -1300 ml        PHYSICAL EXAM:  Gen:  []   WD [x]   WN  []   cachectic  []   thin  []   obese  []   disheveled             []    Critically ill or  []   Chronically ill appearing    HEENT:   []    red/pink conjunctivae []   pale conjunctivae                  PERRL  []   yes  []   no        hearing intact to voice []   yes  []   No               [x]   moist or  []   dry  Mucosa  NECK:   supple []   yes  []   no      masses []   yes  []   No               thyroid    []   non tender []   tender    RESP:   use of accessory muscles []   yes [x]   no                BS    [x]   clear  []   wheezing []   rhonchi []   crackles   CARD:  murmur  []   yes [x]   no   []   thrill  []   carotid bruits                 LE edema []   yes  []   no    []   JVD present    ABD:    tenderness []   yes [x]   no   Liver/spleen enlargement []    yes []    no                distended []    yes [x]   no    bowel sound []   normal []   hypo or  []   hyperactive    MSKL:   cyanosis/clubbing []   yes []   no         []    Spastic []   Flaccid []   Cog wheeling      NEUR:   []   cranial nerves intact       []   L []   R weakness    []   follows commands     []    aphasic    [x]   alert  []   lethargic  []   stuporous  []   sedated             Alert and Oriented  []   time  []   place    []   person  PSYCH:   insight []   poor []   good     mood []   depressed []   anxious []   agitated                   []   Telemetry Reviewed     []   NSR []   PAC/PVCs   []   Afib  []   Paced   []   NSVT   []   Foley []   NGT  []   Intubated on vent    Lab Data Reviewed:  No components found for: "GLPOC"  Recent Labs     06/28/22  0330   NA 145   K 4.0   CL 114*   CO2 30   BUN 18   WBC 6.9   HGB 8.2*   HCT 27.4*   PLT 324        []     I have personally reviewed the   []   xray  []   CT scan    Assessment/Plan:     Principal Problem:    Severe sepsis (HCC)  Active Problems:    Obstructive sleep apnea syndrome    Diabetes (HCC)    Decubitus ulcer of sacral region, stage 4 (HCC)    Hypertension    History of CVA (cerebrovascular accident)    Hyperlipidemia    Sacral osteomyelitis (HCC)  Resolved Problems:    * No resolved hospital problems. *     ___________________________________________________  PLAN:  Risk of deterioration:  []   Low    []   Moderate  []   High       Patient admitted from home on 11/17 secondary to infection of stage IV decubitus sacral ulcer. She was receiving wound care at home but noticed increased pain and drainage. Patient underwent I&D and debridement with Dr. (GS) on 11/21. Intra-op cultures were obtained. Dr. (ID) following for antibiotic recs.     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 -->resolved. Remains on vanc and zosyn. ID recommends outpt IV abx, s/p PICC line.    Has wound Vacc on;    Bacteroides bacteremia, wound infection -  blood culture Drawn 11/17.   #1. Surgery (Drs. , ) following. Recommendation made for wound vac to sacrum, wound vac placed.  Continue vanc, Zosyn. Wound care consulted. Blood cultures redrawn on 11/19, NGTD. intra-op cultures-->MRSA (prelim/antigen detection).     Repeat b/c x 2;    DMT2, insulin dependent with hyperglycemia - SSI + Lantus     Hypertension, essential - did  not appear to be taking amlodipine or home antihypertensives any longer. Will monitor. Added back amlodipine on 11/21.     History of CVA - on ASA, statin     Hyperlipidemia - continue statin.     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 with external bone growth stimulator placement on Dec 09, 2021     Sleep apnea noncompliant with CPAP    __________________________________________________    Total time spent with patient:     minutes    []   Critical Care time:  minutes    Care Plan discussed with:    [x]   Patient   []   Family    []   Care Manager  [x]   Nursing   []   Consultant/Specialist :      []     >50% of visit spent in counseling and coordination of care   (Discussed []   CODE status,  []   Care Plan, []   D/C Planning)    Prophylaxis:  []   Lovenox  []   Coumadin  [x]   Hep SQ  []   SCD's  []   H2B/PPI    Disposition:  []   Home w/ Family   []   HH PT,OT,RN   [x]   SNF/LTC   []   SAH/Rehab  ___________________________________________________    Hospitalist: , MD     ___________________________________________________    *Medications reviewed:  Current Facility-Administered Medications   Medication Dose Route Frequency    insulin glargine (LANTUS) injection vial 30 Units  30 Units SubCUTAneous Nightly    insulin lispro (HUMALOG) injection vial 7 Units  7 Units SubCUTAneous TID WC    insulin lispro (HUMALOG) injection vial 0-15 Units  0-15 Units SubCUTAneous 4x Daily AC & HS    amLODIPine (NORVASC) tablet 5 mg  5 mg Oral Daily    0.9 % sodium chloride infusion   IntraVENous PRN    lactobacillus (CULTURELLE) capsule 1 capsule  1 capsule Oral Daily with breakfast    vancomycin (VANCOCIN) 1,000 mg in sodium chloride 0.9 % 250 mL (vial-mate) IVPB  1,000 mg IntraVENous Q18H    sodium chloride flush 0.9 % injection 5-40 mL  5-40 mL IntraVENous 2 times per day    sodium chloride flush 0.9 % injection 5-40 mL  5-40 mL IntraVENous PRN    potassium chloride (KLOR-CON M)  extended release tablet 40 mEq  40 mEq Oral PRN    Or    potassium bicarb-citric acid (EFFER-K) effervescent tablet 40 mEq  40 mEq Oral PRN    Or    potassium chloride 10 mEq/100 mL IVPB (Peripheral Line)  10 mEq IntraVENous PRN    magnesium sulfate 2000 mg in 50 mL IVPB premix  2,000 mg IntraVENous PRN    ondansetron (ZOFRAN-ODT) disintegrating tablet 4 mg  4 mg Oral Q8H PRN    Or    ondansetron (ZOFRAN) injection 4 mg  4 mg IntraVENous Q6H PRN    polyethylene glycol (GLYCOLAX) packet 17 g  17 g Oral Daily PRN    famotidine (PEPCID) tablet 20 mg  20 mg Oral Daily    acetaminophen (TYLENOL) tablet 650 mg  650 mg Oral Q6H PRN    Or    acetaminophen (TYLENOL) suppository 650 mg  650 mg Rectal Q6H PRN    glucose chewable tablet 16 g  4 tablet Oral PRN    dextrose bolus 10% 125 mL  125 mL IntraVENous PRN    Or    dextrose bolus 10% 250 mL  250 mL IntraVENous PRN    glucagon injection 1 mg  1 mg SubCUTAneous PRN    dextrose 10 % infusion   IntraVENous Continuous PRN    piperacillin-tazobactam (ZOSYN) 3,375 mg in sodium chloride 0.9 % 50 mL IVPB (mini-bag)  3,375 mg IntraVENous Q8H    heparin (porcine) injection 5,000 Units  5,000 Units SubCUTAneous 3 times per day    baclofen (LIORESAL) tablet 10 mg  10 mg Oral BID    aspirin tablet 325 mg  325 mg Oral Daily    atorvastatin (LIPITOR)  tablet 80 mg  80 mg Oral Nightly    ferrous sulfate (IRON 325) tablet 325 mg  325 mg Oral Daily

## 2022-06-30 ENCOUNTER — Encounter: Payer: PRIVATE HEALTH INSURANCE | Primary: Physician Assistant

## 2022-06-30 LAB — POCT GLUCOSE
POC Glucose: 172 mg/dL — ABNORMAL HIGH (ref 70–110)
POC Glucose: 108 mg/dL (ref 70–110)
POC Glucose: 227 mg/dL — ABNORMAL HIGH (ref 70–110)
POC Glucose: 216 mg/dL — ABNORMAL HIGH (ref 70–110)

## 2022-06-30 MED ORDER — CARVEDILOL 6.25 MG PO TABS
6.25 MG | Freq: Two times a day (BID) | ORAL | Status: AC
Start: 2022-06-30 — End: 2022-07-04
  Administered 2022-06-30 – 2022-07-04 (×9): 6.25 mg via ORAL

## 2022-06-30 MED FILL — FERROUS SULFATE 325 (65 FE) MG PO TABS: 325 (65 Fe) MG | ORAL | Qty: 1

## 2022-06-30 MED FILL — FAMOTIDINE 20 MG PO TABS: 20 MG | ORAL | Qty: 1

## 2022-06-30 MED FILL — INSULIN LISPRO 100 UNIT/ML IJ SOLN: 100 UNIT/ML | INTRAMUSCULAR | Qty: 7

## 2022-06-30 MED FILL — CARVEDILOL 6.25 MG PO TABS: 6.25 MG | ORAL | Qty: 1

## 2022-06-30 MED FILL — PIPERACILLIN SOD-TAZOBACTAM SO 3.375 (3-0.375) G IV SOLR: 3.375 (3-0.375) g | INTRAVENOUS | Qty: 3375

## 2022-06-30 MED FILL — ASPIRIN ADULT 325 MG PO TABS: 325 MG | ORAL | Qty: 1

## 2022-06-30 MED FILL — BACLOFEN 10 MG PO TABS: 10 MG | ORAL | Qty: 1

## 2022-06-30 MED FILL — INSULIN GLARGINE 100 UNIT/ML SC SOLN: 100 UNIT/ML | SUBCUTANEOUS | Qty: 30

## 2022-06-30 MED FILL — VANCOMYCIN HCL 1 G IV SOLR: 1 g | INTRAVENOUS | Qty: 1000

## 2022-06-30 MED FILL — NORVASC 5 MG PO TABS: 5 MG | ORAL | Qty: 1

## 2022-06-30 MED FILL — HEPARIN SODIUM (PORCINE) 5000 UNIT/ML IJ SOLN: 5000 UNIT/ML | INTRAMUSCULAR | Qty: 1

## 2022-06-30 MED FILL — CULTURELLE PO CAPS: ORAL | Qty: 1

## 2022-06-30 MED FILL — INSULIN LISPRO 100 UNIT/ML IJ SOLN: 100 UNIT/ML | INTRAMUSCULAR | Qty: 3

## 2022-06-30 MED FILL — INSULIN LISPRO 100 UNIT/ML IJ SOLN: 100 UNIT/ML | INTRAMUSCULAR | Qty: 6

## 2022-06-30 MED FILL — ATORVASTATIN CALCIUM 40 MG PO TABS: 40 MG | ORAL | Qty: 2

## 2022-06-30 NOTE — Plan of Care (Signed)
Problem: Physical Therapy - Adult  Goal: By Discharge: Performs mobility at highest level of function for planned discharge setting.  See evaluation for individualized goals.  Description: Note: Physical Therapy Goals:  Initiated 06/21/2022 to be met within 7-10 days. Re-evaluated 06/30/22     1.  Patient will move from supine to sit and sit to supine  in bed with minimal assistance/contact guard assist.    2.  Patient will transfer from bed to chair and chair to bed with minimal assistance/contact guard assist using the least restrictive device.  3.  Patient will perform sit to stand with minimal assistance/contact guard assist.  4.  Patient will ambulate with minimal assistance/contact guard assist for 15 feet with the least restrictive device.   5.  Patient will ascend/descend 3 stairs with 1 handrail(s) with moderate assistance .     PLOF: used rolling walker for ambulation around home, lives with daughter who assists with ADLs, 1 story, 3 stairs to enter with handrailing  Outcome: Progressing     PHYSICAL THERAPY RE-EVALUATION    Patient: [name] Short (66 y.o. female)  Date: 06/30/2022  Primary Diagnosis: Septicemia ([place]) [A41.9]  Sepsis ([place]) [A41.9]  Procedure(s) (LRB):  INCISION AND DRAINAGE /DEBRIDEMENT SACRAL DECUBITUS (N/A) 6 Days Post-Op   Precautions: Fall Risk, General Precautions      ASSESSMENT :  Pt received in bed in NaD and agreeable, daughter present for support. Pt with decreased ROM and strength to BLE. Pt stood to RW this date with mod A x 2. Agreeable to transfer to recliner with SPT. Pt having difficulty taking steps and assisted with manually weight shifting laterally to help facilitate taking a step with increased step clearance. Pt required assist for controlled lowering into recliner, set up for lunch with all needs met. Will continue to follow. Educated on importance of daily mobility.     DEFICITS/IMPAIRMENTS:    Body Structures, Functions, Activity Limitations Requiring Skilled  Therapeutic Intervention: Decreased functional mobility ;Decreased strength;Decreased endurance;Decreased balance;Increased pain    Patient will benefit from skilled intervention to address the above impairments.  Patient's rehabilitation potential/Therapy Prognosis: Fair.  Factors which may influence rehabilitation potential include:   _0          None noted  _1          Mental ability/status  _2          Medical condition  _3          Home/family situation and support systems  _4          Safety awareness  _5          Pain tolerance/management  _6          Other:      PLAN :  Recommendations and Planned Interventions:   _7            Bed Mobility Training             _8     Neuromuscular Re-Education  _9            Transfer Training                   _10     Orthotic/Prosthetic Training  _11            Gait Training                          _12     Modalities  _13            Therapeutic Exercises           _14   Edema Management/Control  _0            Therapeutic Activities            _1     Family Training/Education  _2            Patient Education  _3            Other (comment):    Frequency/Duration: Patient will be followed by physical therapy to address goals, 1-2 times per day/3-5 days per week to address goals.    Further Equipment Recommendations for Discharge: RW, BSC, WC,     AMPAC:    12    Current research shows that an AM-PAC score of 17 (13 without stairs) or less is not associated with a discharge to the patient's home setting. Based on an AM-PAC score and their current functional mobility deficits, it is recommended that the patient have 5-7 sessions per week of Physical Therapy at d/c to increase the patient's independence.  Currently, this patient demonstrates the potential endurance, and/or tolerance for 3 hours of therapy each day at d/c.    This AMPAC score should be considered in conjunction with interdisciplinary team recommendations to determine the most appropriate discharge setting. Patient's social  support, diagnosis, medical stability, and prior level of function should also be taken into consideration.     SUBJECTIVE:   Patient stated "I will get up."    OBJECTIVE DATA SUMMARY:   Hospital course since last seen and reason for re-evaluation:pt making slow but steady progress towards goals this date, was able to get up in chair this date  Past Medical History:   Diagnosis Date    Anemia     Arthritis     Chronic pain     Legs and Shoulders    Diabetes ([place])     Exposure to asbestos     History of blood transfusion     History of colon polyps     HTN (hypertension)     Hx of blood clots     DVT    Hyperlipemia     Menopause     Pulmonary nodule     Serum calcium elevated     Sleep apnea     not using cpap    Stroke [facility])      Past Surgical History:   Procedure Laterality Date    BACK SURGERY N/A 06/24/2022    INCISION AND DRAINAGE /DEBRIDEMENT SACRAL DECUBITUS performed by Nestor Ramp, DO at [place]    COLONOSCOPY N/A 01/13/2017    COLONOSCOPY performed by Joycelyn Schmid, MD at [place] N/A 12/28/2021    SACRAL WOUND DEBRIDEMENT INCISION AND DRAINAGE performed by Nestor Ramp, DO at [place] N/A 12/09/2021    CERVICAL THREE/FOUR/FIVE/SIX LAMINECTOMY FUSION; C-ARM; STRYKER; EXT BONE GROWTH STIM; 23 HR performed by Candi Leash, MD at [place] Situation:  Social/Functional History  Lives With: Daughter  Type of Home: House  Home Layout: One level  Home Access: Stairs to enter with rails  Entrance Stairs - Number of Steps: 3  Bathroom Toilet: Standard  Bathroom Equipment: Civil engineer, contracting, [place]: Environmental consultant, [place], [place], [employer] Help From: Family  ADL Assistance: Needs assistance  Ambulation Assistance: Needs assistance  Transfer  Assistance: Needs assistance  Active Driver: No  Occupation: Unemployed  Critical  Behavior:  Orientation  Overall Orientation Status: Within Normal Limits  Orientation Level: Oriented X4       Strength:    Strength: Generally decreased, functional    Tone & Sensation:   Tone: Normal  Sensation: Intact    Range Of Motion:  AROM: Generally decreased, functional       Functional Mobility:  Bed Mobility:     Bed Mobility Training  Bed Mobility Training: Yes  Supine to Sit: Moderate assistance  Scooting: Minimum assistance  Transfers:     Pharmacologist: Yes  Sit to Stand: Moderate assistance;Assist X2  Stand to Sit: Minimum assistance;Assist X1  Stand Pivot Transfers: Moderate assistance;Assist X2  Balance:               Balance  Sitting: With support  Sitting - Static: Good (unsupported)  Sitting - Dynamic: Fair (occasional)  Standing: Impaired  Standing - Static: Fair  Standing - Dynamic: Poor    Pain:  Pain level pre-treatment: not rated, back, sacral pain /10   Pain level post-treatment: "    " /10   Pain Intervention(s): Medication (see MAR); Rest, Ice, Repositioning  Response to intervention: Nurse notified    Activity Tolerance:   Activity Tolerance: Patient limited by pain;Patient limited by endurance  Please refer to the flowsheet for vital signs taken during this treatment.    After treatment:   _0          Patient left in no apparent distress sitting up in chair  _1          Patient left in no apparent distress in bed  _2          Call bell left within reach  _3          Nursing notified  _4          Caregiver present  _5          Bed alarm activated  _6          SCDs applied    COMMUNICATION/EDUCATION:   Patient Education  Education Given To: Patient;Family  Education Provided: Print production planner;Fall Prevention Strategies  Education Method: Demonstration;Verbal;Teach Back  Barriers to Learning: None  Education Outcome: Verbalized understanding;Continued education needed    Thank you for this referral.  Idalia Needle, PT  Minutes: 25

## 2022-06-30 NOTE — Plan of Care (Signed)
Problem: Occupational Therapy - Adult  Goal: By Discharge: Performs self-care activities at highest level of function for planned discharge setting.  See evaluation for individualized goals.  Description: Occupational Therapy Goals:  Initiated 06/21/2022, Re-evaluated s/p I &D; goals adjusted to be met within 7-10 days.    1.  Patient will perform bed mobility for ADLs and repositioning with moderate assistance.  2.  Patient will perform upper body dressing with supervision/set-up.  3.  Patient will perform functional activity at EOB with supervision/set-up, F balance.  4.  Patient will perform bathing with minimal assistance.  5.  Patient will participate in upper extremity therapeutic exercise/activities with supervision/set-up for 8 minutes to improve endurance and UB strength needed for ADLs    6.  Patient will utilize energy conservation techniques during functional activities with verbal cues.    PLOF: Pt lives with daughter, reports recently has been working with HHPT on walking, was [facility] due to being able to complete her ADLs with set-up/ AE (sock-aid), daughter and PCA assisting mostly with IADLs. PCA comes in intermittently during the week, daughter is pt's primary caregiver. Per pt and daughter since 1 week ago pt requires significant amount of assist for functional mobility.   Outcome: Progressing   OCCUPATIONAL THERAPY TREATMENT    Patient: [name] Short (65 y.o. female)  Date: 06/30/2022  Diagnosis: Septicemia ([place]) [A41.9]  Sepsis ([place]) [A41.9] Severe sepsis ([place])  Procedure(s) (LRB):  INCISION AND DRAINAGE /DEBRIDEMENT SACRAL DECUBITUS (N/A) 6 Days Post-Op  Precautions: Fall Risk, General Precautions    Chart, occupational therapy assessment, plan of care, and goals were reviewed.  ASSESSMENT:  Pt is pleasant and cooperative. Supportive daughter at bedside. Pt agreeable to OOB. Pt edematous throughout; encouraged increase movement and UE Therex at bed level. Pt request brief prior to OOB  activity 2/2 incontinence. Pt requires MOD A, rolling L/R to don brief. Reviewed lumbar precautions and reinforced importance of maintaining w/bed mobility. Generalized weakness requires 2 person assist w/STS and max verbal/tactile cues for weight shifting to advance BLE w/functional transfer to chair. Pt perform simple ADL grooming tasks at chair level. Pt appears brighter at end of session.    Progression toward goals:  _0           Improving appropriately and progressing toward goals  _1           Improving slowly and progressing toward goals  _2           Not making progress toward goals and plan of care will be adjusted     PLAN:  Patient continues to benefit from skilled intervention to address the above impairments.  Continue treatment per established plan of care.    Further Equipment Recommendations for Discharge:  n/a, pt reports she has DME    AMPAC: AM-PAC Inpatient Daily Activity Raw Score: 14    Current research shows that an AM-PAC score of 17 or less is not associated with a discharge to the patient's home setting. Based on an AM-PAC score and their current ADL deficits; it is recommended that the patient have 5-7 sessions per week of Occupational Therapy at d/c to increase the patient's independence.  Currently, this patient demonstrates the potential endurance, and/or tolerance for 3 hours of therapy each day at d/c.      This AMPAC score should be considered in conjunction with interdisciplinary team recommendations to determine the most appropriate discharge setting. Patient's social support, diagnosis, medical stability, and prior level of function should also be taken  into consideration.     SUBJECTIVE:   Patient stated, "Look at all that in the wound vac."    OBJECTIVE DATA SUMMARY:   Cognitive/Behavioral Status:  Orientation  Overall Orientation Status: Within Normal Limits  Orientation Level: Oriented X4    Functional Mobility and Transfers for ADLs:   Bed Mobility:  Bed Mobility Training  Bed  Mobility Training: Yes  Supine to Sit: Moderate assistance  Scooting: Minimum assistance     Transfers:  Transfer Training  Transfer Training: Yes  Sit to Stand: Moderate assistance;Assist X2  Stand to Sit: Minimum assistance;Assist X1  Stand Pivot Transfers: Moderate assistance;Assist X2    Balance:  Balance  Sitting: With support  Sitting - Static: Good (unsupported)  Sitting - Dynamic: Fair (occasional)  Standing: Impaired  Standing - Static: Fair  Standing - Dynamic: Poor    ADL Intervention:  Grooming: Stand by assistance  Face/hand hygiene at chair level    LE Dressing: Maximum assistance  Donning brief at bed level    UE Therapeutic Exercises:   AROM BUE elbow flexion/extension    Pain:  Pain level pre-treatment: 0/10   Pain level post-treatment: 0/10  None reported    Activity Tolerance:    Good    After treatment:   _0   Patient left in no apparent distress sitting up in chair  _1   Patient left in no apparent distress in bed  _2   Call bell left within reach  _3   Nursing notified  _4   Caregiver present  _5   Bed alarm activated    COMMUNICATION/EDUCATION:   Patient Education  Education Given To: Patient;Family  Education Provided: ADL Systems developer  Education Method: Verbal;Teach Back  Barriers to Learning: None  Education Outcome: Continued education needed    Thank you for this referral.  Jerren Flinchbaugh, OTA  Minutes: 23

## 2022-06-30 NOTE — Progress Notes (Addendum)
Comprehensive Nutrition Assessment    Type and Reason for Visit:  Reassess, Positive Nutrition Screen    Nutrition Recommendations/Plan:   Modify po diet; add diabetic diet restrictions, 3 CHO choice/ meal, no concentrated sweets  Resume supplement: Juven BID (each provides 95 kcal, 2.5g protein). Add Gelatein once daily for additional protein (80 kcal, 20 gm protein each)   Continue to monitor tolerance of PO, compliance of oral supplements, weight, labs, and plan of care during admission.         Malnutrition Assessment:  Malnutrition Status:  At risk for malnutrition (Comment) (Wound) (06/23/22 1053)        Nutrition Assessment:    Pt presents with complaints of increased drainage from sacral ulcer. 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 Positive nutrition screen noted, pressure injury. Pt noted for incision and drainage/debridement of sacral decubitus 11/21. Pt has good appetite/ meal intake, eating 90- 100% of meals. Tolerating diet. Was receiving juven supplement, but was discontinued with pt made NPO and supplement not resumed with diet resumption. Plan to resume supplement per discussion with pt. Pt also reported that she is on regular diet, but has hx of DM and asking for diabetic diet restrictions; will resume restrictions per discussion. Pt understands the importance of BG control and adequate protein intake for promotion of wound healing.       Nutrition Education: 06/23/22  Educated on Nutrition Therapy for Pressure Injuries  Learners: Patient and Family  Readiness: Acceptance  Method: Handout  Response:  Limited visit, will assess learning on follow up.   (Pt understands nutrition information regarding nutrition therapy for pressure injuries (BG control and adequate protein intake) on 06/30/22)  Contact name and number provided.     Nutrition Related Findings:    BM 11/27.   Recent POC BG:  108- 227 mg/dL x 24 hours.  Pertinent meds: lipitor, pepcid, ferrous sulfate, lantus, SSI, lactobacillus, antibiotic.   Pertinent labs reviewed. +edema. Wound Type: Pressure Injury, Stage IV         Current Nutrition Intake & Therapies:    Average Meal Intake: 76-100%  Average Supplements Intake: None Ordered  ADULT DIET; Regular    Anthropometric Measures:  Height: 149.9 cm (4' 11.02")  Ideal Body Weight (IBW): 95 lbs (43 kg)    Admission Body Weight: 81.1 kg (178 lb 12.7 oz)  Current Body Weight: 81.1 kg (178 lb 12.7 oz), 188.2 % IBW. Weight Source: Bed Scale  Current BMI (kg/m2): 36.1  Usual Body Weight: 69.1 kg (152 lb 5.4 oz)  % Weight Change (Calculated): 17.4  Weight Adjustment For: No Adjustment  BMI Categories: Obese Class 2 (BMI 35.0 -39.9)    Estimated Daily Nutrient Needs:  Energy Requirements Based On: Formula  Weight Used for Energy Requirements: Current  Energy (kcal/day): 8469-6295 (1-1.1)  Weight Used for Protein Requirements: Current  Protein (g/day): 122-146 (wt x1.5-1.8)  Method Used for Fluid Requirements: 1 ml/kcal  Fluid (ml/day): 2841-3244    Nutrition Diagnosis:   Increased nutrient needs related to increase demand for energy/nutrients as evidenced by wounds    Nutrition Interventions:   Food and/or Nutrient Delivery: Start Oral Nutrition Supplement, Modify Current Diet  Nutrition Education/Counseling: Education completed  Coordination of Nutrition Care: Continue to monitor while inpatient  Plan of Care discussed with: pt and her daughter    Goals:  Previous Goal Met: Progressing toward Goal(s)  Goals: Meet at least 75% of estimated  needs, by next RD assessment       Nutrition Monitoring and Evaluation:   Behavioral-Environmental Outcomes: None Identified  Food/Nutrient Intake Outcomes: Diet Advancement/Tolerance, Food and Nutrient Intake, Supplement Intake  Physical Signs/Symptoms Outcomes: Biochemical Data, Chewing or Swallowing, Constipation, Diarrhea, GI Status, Nausea or Vomiting, Fluid Status or Edema,  Hemodynamic Status, Meal Time Behavior, Skin, Weight    Discharge Planning:    Continue current diet, Continue Oral Nutrition Supplement     Coralee North, RD  Contact: [phone]

## 2022-06-30 NOTE — Care Coordination-Inpatient (Signed)
PT/OT notes received. Recommendations ARU.     Discussed in IDR, Requested ARU manager to make priority for acceptance. Will need auth.

## 2022-06-30 NOTE — Progress Notes (Signed)
Hospitalist Progress Note    NAME:  [name] Short   DOB:   Sep 18, 1955   MRN:   [id number]     Date/Time:  06/30/2022  Subjective:   Chief Complaint:    Pt has no cp no sob;       Past Med History and Social history reviewed. No changes.   [x]   Current Medication list and allergies reviewed*    Objective:   Vitals:  BP 121/79   Pulse 64   Temp 98.2 F (36.8 C) (Oral)   Resp 19   Ht 1.499 m (4' 11.02")   Wt 81.1 kg (178 lb 12.7 oz)   SpO2 99%   BMI 36.09 kg/m   Temp (24hrs), Avg:97.9 F (36.6 C), Min:97.4 F (36.3 C), Max:98.2 F (36.8 C)      Last 24hr Input/Output:    Intake/Output Summary (Last 24 hours) at 06/30/2022 1640  Last data filed at 06/30/2022 0700  Gross per 24 hour   Intake --   Output 1000 ml   Net -1000 ml        PHYSICAL EXAM:  Gen:  []   WD [x]   WN  []   cachectic  []   thin  []   obese  []   disheveled             []    Critically ill or  []   Chronically ill appearing    HEENT:   []    red/pink conjunctivae []   pale conjunctivae                  PERRL  []   yes  []   no        hearing intact to voice []   yes  []   No               [x]   moist or  []   dry  Mucosa  NECK:   supple []   yes  []   no      masses []   yes  []   No               thyroid    []   non tender []   tender    RESP:   use of accessory muscles []   yes [x]   no                BS    [x]   clear  []   wheezing []   rhonchi []   crackles   CARD:  murmur  []   yes [x]   no   []   thrill  []   carotid bruits                 LE edema []   yes  []   no    []   JVD present    ABD:    tenderness []   yes [x]   no   Liver/spleen enlargement []    yes []    no                distended []    yes [x]   no    bowel sound []   normal []   hypo or  []   hyperactive    MSKL:   cyanosis/clubbing []   yes []   no         []    Spastic []   Flaccid []   Cog wheeling      NEUR:   []   cranial nerves intact       []   L []   R weakness    []   follows commands     []    aphasic    [x]   alert  []   lethargic  []   stuporous  []   sedated             Alert and Oriented  []   time  []   place   []    person  PSYCH:   insight []   poor []   good     mood []   depressed []   anxious []   agitated                   []   Telemetry Reviewed     []   NSR []   PAC/PVCs   []   Afib  []   Paced   []   NSVT   []   Foley []   NGT  []   Intubated on vent    Lab Data Reviewed:  No components found for: "GLPOC"  Recent Labs     06/28/22  0330   NA 145   K 4.0   CL 114*   CO2 30   BUN 18   WBC 6.9   HGB 8.2*   HCT 27.4*   PLT 324        []     I have personally reviewed the   []   xray  []   CT scan    Assessment/Plan:     Principal Problem:    Severe sepsis (HCC)  Active Problems:    Obstructive sleep apnea syndrome    Diabetes (HCC)    Decubitus ulcer of sacral region, stage 4 (HCC)    Hypertension    History of CVA (cerebrovascular accident)    Hyperlipidemia    Sacral osteomyelitis (HCC)  Resolved Problems:    * No resolved hospital problems. *     ___________________________________________________  PLAN:  Risk of deterioration:  []   Low    []   Moderate  []   High       Patient admitted from home on 11/17 secondary to infection of stage IV decubitus sacral ulcer. She was receiving wound care at home but noticed increased pain and drainage. Patient underwent I&D and debridement with Dr. (GS) on 11/21. Intra-op cultures were obtained. Dr. (ID) following for antibiotic recs.     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 -->resolved. Remains on vanc and zosyn. ID recommends outpt IV abx, s/p PICC line.    Has wound Vacc on;    Bacteroides bacteremia, wound infection -    #1. Surgery (Drs. , ) following. Recommendation made for wound vac to sacrum, wound vac placed.      Continue vanc, Zosyn.thru 08/07/22; s/p Picc line;     Wound care consulted. Blood cultures redrawn on 11/19, NGTD. intra-op cultures-->MRSA (prelim/antigen detection).     Repeat b/c x 2; so fare negative;    DMT2, insulin dependent with hyperglycemia - SSI + Lantus      Hypertension, essential - with high BP; add coreg; and on Norvasc;    History of CVA - on ASA, statin     Hyperlipidemia - continue statin.     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 with external bone growth stimulator placement on Dec 09, 2021     Sleep apnea noncompliant with CPAP    __________________________________________________    Total time spent with patient:     minutes    []   Critical Care time:  minutes    Care Plan discussed with:    [x]   Patient   []   Family    []   Care Manager  [x]   Nursing   []   Consultant/Specialist :      []     >50% of visit spent in counseling and coordination of care   (Discussed []   CODE status,  []   Care Plan, []   D/C Planning)    Prophylaxis:  []   Lovenox  []   Coumadin  [x]   Hep SQ  []   SCD's  []   H2B/PPI    Disposition:  []   Home w/ Family   []   HH PT,OT,RN   [x]   SNF/LTC   []   SAH/Rehab  ___________________________________________________    Hospitalist: Daine GravelKanwarpreet S Deara Bober, MD     ___________________________________________________    *Medications reviewed:  Current Facility-Administered Medications   Medication Dose Route Frequency    carvedilol (COREG) tablet 6.25 mg  6.25 mg Oral BID WC    insulin glargine (LANTUS) injection vial 30 Units  30 Units SubCUTAneous Nightly    insulin lispro (HUMALOG) injection vial 7 Units  7 Units SubCUTAneous TID WC    insulin lispro (HUMALOG) injection vial 0-15 Units  0-15 Units SubCUTAneous 4x Daily AC & HS    amLODIPine (NORVASC) tablet 5 mg  5 mg Oral Daily    0.9 % sodium chloride infusion   IntraVENous PRN    lactobacillus (CULTURELLE) capsule 1 capsule  1 capsule Oral Daily with breakfast    vancomycin (VANCOCIN) 1,000 mg in sodium chloride 0.9 % 250 mL (vial-mate) IVPB  1,000 mg IntraVENous Q18H    sodium chloride flush 0.9 % injection 5-40 mL  5-40 mL IntraVENous 2 times per day    sodium chloride flush 0.9 % injection 5-40 mL  5-40 mL IntraVENous PRN    potassium chloride  (KLOR-CON M) extended release tablet 40 mEq  40 mEq Oral PRN    Or    potassium bicarb-citric acid (EFFER-K) effervescent tablet 40 mEq  40 mEq Oral PRN    Or    potassium chloride 10 mEq/100 mL IVPB (Peripheral Line)  10 mEq IntraVENous PRN    magnesium sulfate 2000 mg in 50 mL IVPB premix  2,000 mg IntraVENous PRN    ondansetron (ZOFRAN-ODT) disintegrating tablet 4 mg  4 mg Oral Q8H PRN    Or    ondansetron (ZOFRAN) injection 4 mg  4 mg IntraVENous Q6H PRN    polyethylene glycol (GLYCOLAX) packet 17 g  17 g Oral Daily PRN    famotidine (PEPCID) tablet 20 mg  20 mg Oral Daily    acetaminophen (TYLENOL) tablet 650 mg  650 mg Oral Q6H PRN    Or    acetaminophen (TYLENOL) suppository 650 mg  650 mg Rectal Q6H PRN    glucose chewable tablet 16 g  4 tablet Oral PRN    dextrose bolus 10% 125 mL  125 mL IntraVENous PRN    Or    dextrose bolus 10% 250 mL  250 mL IntraVENous PRN    glucagon injection 1 mg  1 mg SubCUTAneous PRN    dextrose 10 % infusion   IntraVENous Continuous PRN    piperacillin-tazobactam (ZOSYN) 3,375 mg in sodium chloride 0.9 % 50 mL IVPB (mini-bag)  3,375 mg IntraVENous Q8H    heparin (porcine) injection 5,000 Units  5,000 Units SubCUTAneous 3 times per day    baclofen (LIORESAL) tablet 10 mg  10 mg Oral BID  aspirin tablet 325 mg  325 mg Oral Daily    atorvastatin (LIPITOR) tablet 80 mg  80 mg Oral Nightly    ferrous sulfate (IRON 325) tablet 325 mg  325 mg Oral Daily

## 2022-06-30 NOTE — Progress Notes (Signed)
0045 Patient placed on Home CPAP machine.

## 2022-06-30 NOTE — Progress Notes (Signed)
Infectious Disease progress Note        Reason:sepsis, sacral abscess/osteomyelitis, gram negative bloodstream infection    Current abx Prior abx   Zosyn, vancomycin since 06/20/22      Lines:       Assessment :  [name] with a past medical history of uncontrolled Type 2 DM, HTN, DVT, prior CVA in left brain stem and cerebellum see on MRI in 2022 who presented to ED on 06/20/22 with concerns for worsening sacral wound infection, weakness    Hospitalization at [facility] 12/2021 for infected necrotic sacral decubiti.     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  Status post I&D on 06/24/2022.  Intraoperative findings noted.  Necrotic soft tissue/slough noted in the sacrum with purulent fluid.  Pus pocket extended superiorly and laterally to the larger pocket.    IntraOp cultures 11/21-MRSA     Cervical spondylotic myelopathy: s/p  C3, C4, C5, C6 laminectomy; posterior cervical fusion C3, C4, C5, C6; segmental instrumentation, [employer]ryker type, C3, C4, C5, C6 with lateral mass screws on 12/09/21        Recommendations:     Continue zosyn, vancomycin till 08/07/22  Follow up surgery recommendations regarding sacral wound care  Patient will need outpatient IV antibiotic upon discharge.  Place PICC line for outpatient IV antibiotics  D/c planning per primary team  [facility] pharmacy to adjust antibiotic dosing, monitor vancomycin levels.           Above plan was discussed in details with patient, daughter at bedside and primary team. Please call me if any further questions or concerns. Will continue to  participate in the care of this patient.    HPI:    No new complaints      Past Medical History:   Diagnosis Date    Anemia     Arthritis     Chronic pain     Legs and Shoulders    Diabetes (HCC)     Exposure to asbestos     History of blood transfusion     History of colon polyps     HTN (hypertension)     Hx of blood clots     DVT    Hyperlipemia     Menopause     Pulmonary nodule     Serum calcium elevated     Sleep apnea     not using cpap    Stroke [facility])        Past Surgical History:   Procedure Laterality Date    BACK SURGERY N/A 06/24/2022    INCISION AND DRAINAGE /DEBRIDEMENT SACRAL DECUBITUS performed by Phineas Semen, DO at [facility] MAIN OR    CESAREAN SECTION  1987    COLONOSCOPY N/A 01/13/2017    COLONOSCOPY performed by Levan Hurst, MD at [facility] ENDOSCOPY    COLONOSCOPY      DILATION AND CURETTAGE OF UTERUS  1995    RECTAL SURGERY N/A 12/28/2021    SACRAL WOUND DEBRIDEMENT INCISION AND DRAINAGE performed by Phineas Semen, DO at [facility] MAIN OR    SPINE SURGERY N/A 12/09/2021    CERVICAL THREE/FOUR/FIVE/SIX  LAMINECTOMY FUSION; C-ARM; STRYKER; EXT BONE GROWTH STIM; 23 HR performed by Pamalee Leyden, MD at [facility] MAIN OR       [email or web address] @    Current Facility-Administered Medications   Medication Dose Route Frequency    insulin glargine (LANTUS) injection vial 30 Units  30 Units SubCUTAneous Nightly    insulin lispro (HUMALOG) injection vial 7 Units  7 Units SubCUTAneous TID WC    insulin lispro (HUMALOG) injection vial 0-15 Units  0-15 Units SubCUTAneous 4x Daily AC & HS    amLODIPine (NORVASC) tablet 5 mg  5 mg Oral Daily    0.9 % sodium chloride infusion   IntraVENous PRN    lactobacillus (CULTURELLE) capsule 1 capsule  1 capsule Oral Daily with breakfast    vancomycin (VANCOCIN) 1,000 mg in sodium chloride 0.9 % 250 mL (vial-mate) IVPB  1,000 mg IntraVENous Q18H    sodium chloride flush 0.9 % injection 5-40 mL  5-40 mL IntraVENous 2 times per day    sodium chloride flush 0.9 % injection 5-40 mL  5-40 mL  IntraVENous PRN    potassium chloride (KLOR-CON M) extended release tablet 40 mEq  40 mEq Oral PRN    Or    potassium bicarb-citric acid (EFFER-K) effervescent tablet 40 mEq  40 mEq Oral PRN    Or    potassium chloride 10 mEq/100 mL IVPB (Peripheral Line)  10 mEq IntraVENous PRN    magnesium sulfate 2000 mg in 50 mL IVPB premix  2,000 mg IntraVENous PRN    ondansetron (ZOFRAN-ODT) disintegrating tablet 4 mg  4 mg Oral Q8H PRN    Or    ondansetron (ZOFRAN) injection 4 mg  4 mg IntraVENous Q6H PRN    polyethylene glycol (GLYCOLAX) packet 17 g  17 g Oral Daily PRN    famotidine (PEPCID) tablet 20 mg  20 mg Oral Daily    acetaminophen (TYLENOL) tablet 650 mg  650 mg Oral Q6H PRN    Or    acetaminophen (TYLENOL) suppository 650 mg  650 mg Rectal Q6H PRN    glucose chewable tablet 16 g  4 tablet Oral PRN    dextrose bolus 10% 125 mL  125 mL IntraVENous PRN    Or    dextrose bolus 10% 250 mL  250 mL IntraVENous PRN    glucagon injection 1 mg  1 mg SubCUTAneous PRN    dextrose 10 % infusion   IntraVENous Continuous PRN    piperacillin-tazobactam (ZOSYN) 3,375 mg in sodium chloride 0.9 % 50 mL IVPB (mini-bag)  3,375 mg IntraVENous Q8H    heparin (porcine) injection 5,000 Units  5,000 Units SubCUTAneous 3 times per day    baclofen (LIORESAL) tablet 10 mg  10 mg Oral BID    aspirin tablet 325 mg  325 mg Oral Daily    atorvastatin (LIPITOR) tablet 80 mg  80 mg Oral Nightly    ferrous sulfate (IRON 325) tablet 325 mg  325 mg Oral Daily       Allergies: Lactose    Family History   Problem Relation Age of Onset    Diabetes Other 75        parent, NOS    Cancer Father     Hypertension Other 35        parent,NOS    Heart Disease Other 58        parent, NOS    Lung Disease Father      Social History     Socioeconomic History  Marital status: Single     Spouse name: Not on file    Number of children: Not on file    Years of education: Not on file    Highest education level: Not on file   Occupational History    Not on file   Tobacco  Use    Smoking status: Former     Packs/day: 1     Types: Cigarettes, Cigars     Quit date: 11/13/2021     Years since quitting: 0.6    Smokeless tobacco: Never   Vaping Use    Vaping Use: Never used   Substance and Sexual Activity    Alcohol use: Not Currently     Comment: occ    Drug use: Never    Sexual activity: Not on file   Other Topics Concern    Not on file   Social History Narrative    Not on file     Social Determinants of Health     Financial Resource Strain: Low Risk  (01/05/2022)    Overall Financial Resource Strain (CARDIA)     Difficulty of Paying Living Expenses: Not hard at all   Food Insecurity: No Food Insecurity (01/05/2022)    Hunger Vital Sign     Worried About Running Out of Food in the Last Year: Never true     Ran Out of Food in the Last Year: Never true   Transportation Needs: No Transportation Needs (02/09/2022)    OASIS A1250: Transportation     Lack of Transportation (Medical): No     Lack of Transportation (Non-Medical): No     Patient Unable or Declines to Respond: No   Physical Activity: Inactive (01/05/2022)    Exercise Vital Sign     Days of Exercise per Week: 0 days     Minutes of Exercise per Session: 0 min   Stress: No Stress Concern Present (01/05/2022)    [employer] of Occupational Health - Occupational Stress Questionnaire     Feeling of Stress : Not at all   Social Connections: Feeling Socially Integrated (02/09/2022)    OASIS D0700: Social Isolation     Frequency of experiencing loneliness or isolation: Never   Recent Concern: Social Connections - Moderately Isolated (01/05/2022)    Social Connection and Isolation Panel [NHANES]     Frequency of Communication with Friends and Family: Three times a week     Frequency of Social Gatherings with Friends and Family: More than three times a week     Attends Religious Services: More than 4 times per year     Active Member of Clubs or Organizations: No     Attends Banker Meetings: Never     Marital Status: Widowed   Intimate  Partner Violence: Not At Risk (01/05/2022)    Humiliation, Afraid, Rape, and Kick questionnaire     Fear of Current or Ex-Partner: No     Emotionally Abused: No     Physically Abused: No     Sexually Abused: No   Housing Stability: Low Risk  (01/05/2022)    Housing Stability Vital Sign     Unable to Pay for Housing in the Last Year: No     Number of Places Lived in the Last Year: 1     Unstable Housing in the Last Year: No     Social History     Tobacco Use   Smoking Status Former    Packs/day: 1    Types:  Cigarettes, Cigars    Quit date: 11/13/2021    Years since quitting: 0.6   Smokeless Tobacco Never        Temp (24hrs), Avg:97.7 F (36.5 C), Min:97.3 F (36.3 C), Max:98.2 F (36.8 C)    BP (!) 171/64   Pulse 69   Temp 97.4 F (36.3 C) (Oral)   Resp 20   Ht 1.499 m (4' 11.02")   Wt 81.1 kg (178 lb 12.7 oz)   SpO2 97%   BMI 36.09 kg/m     ROS: 12 point ROS obtained in details. Pertinent positives as mentioned in HPI,   otherwise negative    Physical Exam:    General: NAD, appears stated age, alert, comfortable    Eyes: PERRL, sclera is non-icteric  HENT: normocephalic/atraumatic, moist mucus membranes  Respiratory: CTA with no signs of respiratory distress  Cardiovascular: S1S2 regular, no cyanosis + 1-2 BLE ankle edema  GI: soft, non-tender, normal bowel sounds  Neuro: moves all extremities, no focal deficits, normal speech  Psych: appropriate mood and affect, no visual or auditory hallucinations  Skin: stage 4 sacral ulcer with red granulation tissue- no purulence, fluctuant tender swelling proximal to sacral ulcer       Labs: Results:   Chemistry Recent Labs     06/28/22  0330   GLUCOSE 141*   NA 145   K 4.0   CL 114*   CO2 30   BUN 18   CREATININE 0.92        CBC w/Diff Recent Labs     06/28/22  0330   WBC 6.9   RBC 3.54*   HGB 8.2*   HCT 27.4*   PLT 324        Microbiology Results       Procedure Component Value Units Date/Time    Culture, Blood 2 [[id number]][[id number]] Collected: 06/28/22 1603    Order Status:  Completed Specimen: Blood Updated: 06/30/22 0734     Special Requests NO SPECIAL REQUESTS        Culture NO GROWTH 2 DAYS       Culture, Blood 1 [[id number]][[id number]] Collected: 06/28/22 1558    Order Status: Completed Specimen: Blood Updated: 06/30/22 0734     Special Requests NO SPECIAL REQUESTS        Culture NO GROWTH 2 DAYS       Culture, Anaerobic [[id number]][[phone]]  (Abnormal) Collected: 06/24/22 1647    Order Status: Completed Specimen: Tissue Updated: 06/26/22 1207     Special Requests SACRAL DECUBITUS     Culture       MODERATE BACTEROIDES THETAIOTAOMICRON BETA LACTAMASE POSITIVE                  MODERATE Bacteroides fragilis BETA LACTAMASE POSITIVE          Culture, Tissue [[id number]][[phone]]  (Abnormal)  (Susceptibility) Collected: 06/24/22 1647    Order Status: Completed Specimen: Tissue Updated: 06/28/22 0820     Special Requests SACRAL DECUBITUS     Gram stain OCCASIONAL WBCS SEEN         NO ORGANISMS SEEN        Culture       RARE * Methicillin Resistant Staphylococcus aureus *                  RARE STAPHYLOCOCCUS SPECIES, COAGULASE NEGATIVE            (NOTE) PRELIMINARY REPORT OF MRSA CALLED TO L RIDDICK RN AT [id number] ON 11/24. CH.  Susceptibility        Methicillin-Resistant Staphylococcus aureus      BACTERIAL SUSCEPTIBILITY PANEL MIC      ciprofloxacin >=8 ug/mL Resistant      clindamycin 0.25 ug/mL Resistant      DAPTOmycin 0.25 ug/mL Sensitive      doxycycline 8 ug/mL Intermediate      erythromycin >=8 ug/mL Resistant      gentamicin <=0.5 ug/mL Sensitive      levofloxacin >=8 ug/mL Resistant      linezolid 4 ug/mL Sensitive      oxacillin >=4 ug/mL Resistant      tetracycline >=16 ug/mL Resistant      trimethoprim-sulfamethoxazole <=10 ug/mL Sensitive      vancomycin 1 ug/mL Sensitive                           Culture, Blood 1 [[id number]] Collected: 06/22/22 0433    Order Status: Completed Specimen: Blood Updated: 06/28/22 0608     Special Requests NO SPECIAL REQUESTS        Culture NO GROWTH 6 DAYS       Culture,  Blood 2 [[id number]] Collected: 06/22/22 0433    Order Status: Completed Specimen: Blood Updated: 06/28/22 0608     Special Requests NO SPECIAL REQUESTS        Culture NO GROWTH 6 DAYS       Culture, Wound Aerobic Only [[id number]] Collected: 06/20/22 2230    Order Status: Canceled Specimen: Sacrum     Blood Culture 2 [[id number]] Collected: 06/20/22 1820    Order Status: Completed Specimen: Blood Updated: 06/26/22 0707     Special Requests NO SPECIAL REQUESTS        Culture NO GROWTH 6 DAYS       Respiratory Panel, Molecular, with COVID-19 (Restricted: peds pts or suitable admitted adults) [[id number]] Collected: 06/20/22 1820    Order Status: Completed Specimen: Nasopharyngeal Updated: 06/20/22 1926     Adenovirus by PCR Not detected        Coronavirus 229E by PCR Not detected        Coronavirus HKU1 by PCR Not detected        Coronavirus NL63 by PCR Not detected        Coronavirus OC43 by PCR Not detected        SARS-CoV-2, PCR Not detected        Human Metapneumovirus by PCR Not detected        Rhinovirus Enterovirus PCR Not detected        Influenza A by PCR Not detected        Influenza B PCR Not detected        Parainfluenza 1 PCR Not detected        Parainfluenza 2 PCR Not detected        Parainfluenza 3 PCR Not detected        Parainfluenza 4 PCR Not detected        Respiratory Syncytial Virus by PCR Not detected        Bordetella parapertussis by PCR Not detected        Bordetella pertussis by PCR Not detected        Chlamydophila Pneumonia PCR Not detected        Mycoplasma pneumo by PCR Not detected       Culture, Blood, PCR ID Panel [[id number]]  (Abnormal) Collected: 06/20/22 1820    Order Status: Completed Specimen: Blood Updated: 06/21/22 1848  Accession Number [id number]     Enterococcus faecalis by PCR Not detected        Enterococcus faecium by PCR Not detected        Listeria monocytogenes by PCR Not detected        STAPHYLOCOCCUS Not detected        Staphylococcus Aureus Not detected         Staphylococcus epidermidis by PCR Not detected        Staphylococcus lugdunensis by PCR Not detected        STREPTOCOCCUS Not detected        Streptococcus agalactiae (Group B) Not detected        Strep pneumoniae Not detected        Strep pyogenes,(Grp. A) Not detected        Acinetobacter calcoac baumannii complex by PCR Not detected        Bacteroides fragilis by PCR Detected        Enterobacteriaceae by PCR Not detected        Enterobacter cloacae complex by PCR Not detected        Escherichia Coli Not detected        Klebsiella aerogenes by PCR Not detected        Klebsiella oxytoca by PCR Not detected        Klebsiella pneumoniae group by PCR Not detected        Proteus by PCR Not detected        Salmonella species by PCR Not detected        Serratia marcescens by PCR Not detected        Haemophilus Influenzae by PCR Not detected        Neisseria meningitidis by PCR Not detected        Pseudomonas aeruginosa Not detected        Stenotrophomonas maltophilia by PCR Not detected        Candida albicans by PCR Not detected        Candida auris by PCR Not detected        Candida glabrata Not detected        Candida krusei by PCR Not detected        Candida parapsilosis by PCR Not detected        Candida tropicalis by PCR Not detected        Cryptococcus neoformans/gattii by PCR Not detected        Resistant gene targets          [employer] test comment       False positive results may rarely occur. Correlate with clinical,epidemiologic, and other laboratory findings           Comment: Please see BCID Interpretation Guide in EPIC Links       Blood Culture 1 [[id number]]  (Abnormal) Collected: 06/20/22 1800    Order Status: Completed Specimen: Blood Updated: 06/23/22 1031     Special Requests NO SPECIAL REQUESTS        Gram stain       ANAEROBIC BOTTLE Gram negative rods                  SMEAR CALLED TO AND CORRECTLY REPEATED BY: RN W BARKER SVTSD ON [id number] AT 1836 HRS TO 1396           Culture       Bacteroides  fragilis BETA LACTAMASE POSITIVE GROWING IN 1 OF 2 BOTTLES DRAWN SITE=RFA  RADIOLOGY:    All available imaging studies/reports in connect care for this admission were reviewed      Disclaimer: Sections of this note are dictated utilizing voice recognition software, which may have resulted in some phonetic based errors in grammar and contents. Even though attempts were made to correct all the mistakes, some may have been missed, and remained in the body of the document. If questions arise, please contact our department.    Dr. Tommie Ard, Infectious Disease Specialist  [phone]  June 30, 2022  8:02 AM

## 2022-07-01 LAB — POCT GLUCOSE
POC Glucose: 228 mg/dL — ABNORMAL HIGH (ref 70–110)
POC Glucose: 80 mg/dL (ref 70–110)
POC Glucose: 90 mg/dL (ref 70–110)
POC Glucose: 94 mg/dL (ref 70–110)
POC Glucose: 100 mg/dL (ref 70–110)

## 2022-07-01 LAB — VANCOMYCIN LEVEL, RANDOM: Vancomycin Rm: 13.6 ug/mL (ref 5.0–40.0)

## 2022-07-01 LAB — FERRITIN: Ferritin: 72 ng/mL (ref 8–388)

## 2022-07-01 MED ORDER — PLACEHOLDER
Freq: Once | Status: DC
Start: 2022-07-01 — End: 2022-07-01
  Administered 2022-07-01: 15:00:00

## 2022-07-01 MED ORDER — VANCOMYCIN 1500 MG NS 500 ML (PREMIX) IVPB
Status: AC
Start: 2022-07-01 — End: 2022-07-04
  Administered 2022-07-01 – 2022-07-03 (×3): 1500 mg via INTRAVENOUS

## 2022-07-01 MED FILL — CARVEDILOL 6.25 MG PO TABS: 6.25 MG | ORAL | Qty: 1

## 2022-07-01 MED FILL — BACLOFEN 10 MG PO TABS: 10 MG | ORAL | Qty: 1

## 2022-07-01 MED FILL — INSULIN LISPRO 100 UNIT/ML IJ SOLN: 100 UNIT/ML | INTRAMUSCULAR | Qty: 6

## 2022-07-01 MED FILL — VANCOMYCIN HCL 1 G IV SOLR: 1 g | INTRAVENOUS | Qty: 0

## 2022-07-01 MED FILL — FERROUS SULFATE 325 (65 FE) MG PO TABS: 325 (65 Fe) MG | ORAL | Qty: 1

## 2022-07-01 MED FILL — ASPIRIN ADULT 325 MG PO TABS: 325 MG | ORAL | Qty: 1

## 2022-07-01 MED FILL — ATORVASTATIN CALCIUM 40 MG PO TABS: 40 MG | ORAL | Qty: 2

## 2022-07-01 MED FILL — HEPARIN SODIUM (PORCINE) 5000 UNIT/ML IJ SOLN: 5000 UNIT/ML | INTRAMUSCULAR | Qty: 1

## 2022-07-01 MED FILL — VANCOMYCIN HCL 1 G IV SOLR: 1 g | INTRAVENOUS | Qty: 1000

## 2022-07-01 MED FILL — INSULIN LISPRO 100 UNIT/ML IJ SOLN: 100 UNIT/ML | INTRAMUSCULAR | Qty: 7

## 2022-07-01 MED FILL — PIPERACILLIN SOD-TAZOBACTAM SO 3.375 (3-0.375) G IV SOLR: 3.375 (3-0.375) g | INTRAVENOUS | Qty: 3375

## 2022-07-01 MED FILL — NORVASC 5 MG PO TABS: 5 MG | ORAL | Qty: 1

## 2022-07-01 MED FILL — FAMOTIDINE 20 MG PO TABS: 20 MG | ORAL | Qty: 1

## 2022-07-01 MED FILL — CULTURELLE PO CAPS: ORAL | Qty: 1

## 2022-07-01 MED FILL — INSULIN GLARGINE 100 UNIT/ML SC SOLN: 100 UNIT/ML | SUBCUTANEOUS | Qty: 30

## 2022-07-01 NOTE — Progress Notes (Signed)
07/01/22 2202   NIV Type   NIV Started/Stopped Off   Settings/Measurements   Patient's Home Machine Yes (type/vendor)  (RESMED)

## 2022-07-01 NOTE — Progress Notes (Signed)
Infectious Disease progress Note        Reason:sepsis, sacral abscess/osteomyelitis, gram negative bloodstream infection    Current abx Prior abx   Zosyn, vancomycin since 06/20/22      Lines:       Assessment :  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  Status post I&D on 06/24/2022.  Intraoperative findings noted.  Necrotic soft tissue/slough noted in the sacrum with purulent fluid.  Pus pocket extended superiorly and laterally to the larger pocket.    IntraOp cultures 11/21-MRSA     Cervical spondylotic myelopathy: s/p  C3, C4, C5, C6 laminectomy; posterior cervical fusion C3, C4, C5, C6; segmental instrumentation, [employer] type, C3, C4, C5, C6 with lateral mass screws on 12/09/21        Recommendations:     Continue zosyn, vancomycin till 08/07/22  Follow up surgery recommendations regarding sacral wound care  Patient will need outpatient IV antibiotic upon discharge.  Place PICC line for outpatient IV antibiotics  D/c planning per primary team  [facility] pharmacy to adjust antibiotic dosing, monitor vancomycin levels.           Above plan was discussed in details with patient, and primary team. Please call me if any further questions or concerns. Will continue to participate in the care  of this patient.    HPI:    No new complaints      Past Medical History:   Diagnosis Date    Anemia     Arthritis     Chronic pain     Legs and Shoulders    Diabetes (HCC)     Exposure to asbestos     History of blood transfusion     History of colon polyps     HTN (hypertension)     Hx of blood clots     DVT    Hyperlipemia     Menopause     Pulmonary nodule     Serum calcium elevated     Sleep apnea     not using cpap    Stroke [facility])        Past Surgical History:   Procedure Laterality Date    BACK SURGERY N/A 06/24/2022    INCISION AND DRAINAGE /DEBRIDEMENT SACRAL DECUBITUS performed by Phineas Semen, DO at [facility] MAIN OR    CESAREAN SECTION  1987    COLONOSCOPY N/A 01/13/2017    COLONOSCOPY performed by Levan Hurst, MD at [facility]    COLONOSCOPY      DILATION AND CURETTAGE OF UTERUS  1995    RECTAL SURGERY N/A 12/28/2021    SACRAL WOUND DEBRIDEMENT INCISION AND DRAINAGE performed by Phineas Semen, DO at [facility] MAIN OR    SPINE SURGERY N/A 12/09/2021    CERVICAL THREE/FOUR/FIVE/SIX LAMINECTOMY FUSION; C-ARM;  STRYKER; EXT BONE GROWTH STIM; 23 HR performed by Pamalee Leyden, MD at [facility] MAIN OR       [email or web address] @    Current Facility-Administered Medications   Medication Dose Route Frequency    vancomycin (VANCOCIN) 1500 mg in sodium chloride 0.9% 500 mL IVPB  1,500 mg IntraVENous Q24H    carvedilol (COREG) tablet 6.25 mg  6.25 mg Oral BID WC    insulin glargine (LANTUS) injection vial 30 Units  30 Units SubCUTAneous Nightly    insulin lispro (HUMALOG) injection vial 7 Units  7 Units SubCUTAneous TID WC    insulin lispro (HUMALOG) injection vial 0-15 Units  0-15 Units SubCUTAneous 4x Daily AC & HS    amLODIPine (NORVASC) tablet 5 mg  5 mg Oral Daily    0.9 % sodium chloride infusion   IntraVENous PRN    lactobacillus (CULTURELLE) capsule 1 capsule  1 capsule Oral Daily with breakfast    sodium chloride flush 0.9 % injection 5-40 mL  5-40 mL IntraVENous 2 times per day    sodium chloride flush 0.9 % injection  5-40 mL  5-40 mL IntraVENous PRN    potassium chloride (KLOR-CON M) extended release tablet 40 mEq  40 mEq Oral PRN    Or    potassium bicarb-citric acid (EFFER-K) effervescent tablet 40 mEq  40 mEq Oral PRN    Or    potassium chloride 10 mEq/100 mL IVPB (Peripheral Line)  10 mEq IntraVENous PRN    magnesium sulfate 2000 mg in 50 mL IVPB premix  2,000 mg IntraVENous PRN    ondansetron (ZOFRAN-ODT) disintegrating tablet 4 mg  4 mg Oral Q8H PRN    Or    ondansetron (ZOFRAN) injection 4 mg  4 mg IntraVENous Q6H PRN    polyethylene glycol (GLYCOLAX) packet 17 g  17 g Oral Daily PRN    famotidine (PEPCID) tablet 20 mg  20 mg Oral Daily    acetaminophen (TYLENOL) tablet 650 mg  650 mg Oral Q6H PRN    Or    acetaminophen (TYLENOL) suppository 650 mg  650 mg Rectal Q6H PRN    glucose chewable tablet 16 g  4 tablet Oral PRN    dextrose bolus 10% 125 mL  125 mL IntraVENous PRN    Or    dextrose bolus 10% 250 mL  250 mL IntraVENous PRN    glucagon injection 1 mg  1 mg SubCUTAneous PRN    dextrose 10 % infusion   IntraVENous Continuous PRN    piperacillin-tazobactam (ZOSYN) 3,375 mg in sodium chloride 0.9 % 50 mL IVPB (mini-bag)  3,375 mg IntraVENous Q8H    heparin (porcine) injection 5,000 Units  5,000 Units SubCUTAneous 3 times per day    baclofen (LIORESAL) tablet 10 mg  10 mg Oral BID    aspirin tablet 325 mg  325 mg Oral Daily    atorvastatin (LIPITOR) tablet 80 mg  80 mg Oral Nightly    ferrous sulfate (IRON 325) tablet 325 mg  325 mg Oral Daily       Allergies: Lactose    Family History   Problem Relation Age of Onset    Diabetes Other 86        parent, NOS    Cancer Father     Hypertension Other 35        parent,NOS    Heart Disease Other 75        parent, NOS    Lung Disease Father  Social History     Socioeconomic History    Marital status: Single     Spouse name: Not on file    Number of children: Not on file    Years of education: Not on file    Highest education level: Not on file   Occupational History    Not  on file   Tobacco Use    Smoking status: Former     Packs/day: 1     Types: Cigarettes, Cigars     Quit date: 11/13/2021     Years since quitting: 0.6    Smokeless tobacco: Never   Vaping Use    Vaping Use: Never used   Substance and Sexual Activity    Alcohol use: Not Currently     Comment: occ    Drug use: Never    Sexual activity: Not on file   Other Topics Concern    Not on file   Social History Narrative    Not on file     Social Determinants of Health     Financial Resource Strain: Low Risk  (01/05/2022)    Overall Financial Resource Strain (CARDIA)     Difficulty of Paying Living Expenses: Not hard at all   Food Insecurity: No Food Insecurity (01/05/2022)    Hunger Vital Sign     Worried About Running Out of Food in the Last Year: Never true     Ran Out of Food in the Last Year: Never true   Transportation Needs: No Transportation Needs (02/09/2022)    OASIS A1250: Transportation     Lack of Transportation (Medical): No     Lack of Transportation (Non-Medical): No     Patient Unable or Declines to Respond: No   Physical Activity: Inactive (01/05/2022)    Exercise Vital Sign     Days of Exercise per Week: 0 days     Minutes of Exercise per Session: 0 min   Stress: No Stress Concern Present (01/05/2022)    [employer] - Occupational Stress Questionnaire     Feeling of Stress : Not at all   Social Connections: Feeling Socially Integrated (02/09/2022)    OASIS D0700: Social Isolation     Frequency of experiencing loneliness or isolation: Never   Recent Concern: Social Connections - Moderately Isolated (01/05/2022)    Social Connection and Isolation Panel [NHANES]     Frequency of Communication with Friends and Family: Three times a week     Frequency of Social Gatherings with Friends and Family: More than three times a week     Attends Religious Services: More than 4 times per year     Active Member of Clubs or Organizations: No     Attends BankerClub or Organization Meetings: Never     Marital Status:  Widowed   Intimate Partner Violence: Not At Risk (01/05/2022)    Humiliation, Afraid, Rape, and Kick questionnaire     Fear of Current or Ex-Partner: No     Emotionally Abused: No     Physically Abused: No     Sexually Abused: No   Housing Stability: Low Risk  (01/05/2022)    Housing Stability Vital Sign     Unable to Pay for Housing in the Last Year: No     Number of Places Lived in the Last Year: 1     Unstable Housing in the Last Year: No     Social History     Tobacco Use   Smoking  Status Former    Packs/day: 1    Types: Cigarettes, Cigars    Quit date: 11/13/2021    Years since quitting: 0.6   Smokeless Tobacco Never        Temp (24hrs), Avg:98 F (36.7 C), Min:97.7 F (36.5 C), Max:98.5 F (36.9 C)    BP 139/63   Pulse 62   Temp 97.7 F (36.5 C) (Oral)   Resp 18   Ht 1.499 m (4' 11.02")   Wt 81.1 kg (178 lb 12.7 oz)   SpO2 98%   BMI 36.09 kg/m     ROS: 12 point ROS obtained in details. Pertinent positives as mentioned in HPI,   otherwise negative    Physical Exam:    General: NAD, appears stated age, alert, comfortable    Eyes: PERRL, sclera is non-icteric  HENT: normocephalic/atraumatic, moist mucus membranes  Respiratory: CTA with no signs of respiratory distress  Cardiovascular: S1S2 regular, no cyanosis + 1-2 BLE ankle edema  GI: soft, non-tender, normal bowel sounds  Neuro: moves all extremities, no focal deficits, normal speech  Psych: appropriate mood and affect, no visual or auditory hallucinations  Skin: stage 4 sacral ulcer with red granulation tissue- no purulence, fluctuant tender swelling proximal to sacral ulcer       Labs: Results:   Chemistry No results for input(s): "GLUCOSE", "NA", "K", "CL", "CO2", "BUN", "CREATININE", "CA", "AGAP", "TP", "ALB", "GLOB", "ALT", "AST" in the last 72 hours.    Invalid input(s): "BUCR", "TBIL", "GPT", "AP", "AGRAT"     CBC w/Diff No results for input(s): "WBC", "RBC", "HGB", "HCT", "PLT" in the last 72 hours.    Invalid input(s): "GRANS", "LYMPH", "EOS"      Microbiology Results       Procedure Component Value Units Date/Time    Culture, Blood 2 [[id number]] Collected: 06/28/22 1603    Order Status: Completed Specimen: Blood Updated: 07/01/22 0626     Special Requests NO SPECIAL REQUESTS        Culture NO GROWTH 3 DAYS       Culture, Blood 1 [[id number]] Collected: 06/28/22 1558    Order Status: Completed Specimen: Blood Updated: 07/01/22 0626     Special Requests NO SPECIAL REQUESTS        Culture NO GROWTH 3 DAYS       Culture, Anaerobic [[id number]]  (Abnormal) Collected: 06/24/22 1647    Order Status: Completed Specimen: Tissue Updated: 06/26/22 1207     Special Requests SACRAL DECUBITUS     Culture       MODERATE BACTEROIDES THETAIOTAOMICRON BETA LACTAMASE POSITIVE                  MODERATE Bacteroides fragilis BETA LACTAMASE POSITIVE          Culture, Tissue [[id number]]  (Abnormal)  (Susceptibility) Collected: 06/24/22 1647    Order Status: Completed Specimen: Tissue Updated: 06/28/22 0820     Special Requests SACRAL DECUBITUS     Gram stain OCCASIONAL WBCS SEEN         NO ORGANISMS SEEN        Culture       RARE * Methicillin Resistant Staphylococcus aureus *                  RARE STAPHYLOCOCCUS SPECIES, COAGULASE NEGATIVE            (NOTE) PRELIMINARY REPORT OF MRSA CALLED TO L Fulton RN AT 6195 ON 11/24. CH.    Susceptibility  Methicillin-Resistant Staphylococcus aureus      BACTERIAL SUSCEPTIBILITY PANEL MIC      ciprofloxacin >=8 ug/mL Resistant      clindamycin 0.25 ug/mL Resistant      DAPTOmycin 0.25 ug/mL Sensitive      doxycycline 8 ug/mL Intermediate      erythromycin >=8 ug/mL Resistant      gentamicin <=0.5 ug/mL Sensitive      levofloxacin >=8 ug/mL Resistant      linezolid 4 ug/mL Sensitive      oxacillin >=4 ug/mL Resistant      tetracycline >=16 ug/mL Resistant      trimethoprim-sulfamethoxazole <=10 ug/mL Sensitive      vancomycin 1 ug/mL Sensitive                           Culture, Blood 1 [[id number]] Collected: 06/22/22 0433    Order  Status: Completed Specimen: Blood Updated: 06/28/22 0608     Special Requests NO SPECIAL REQUESTS        Culture NO GROWTH 6 DAYS       Culture, Blood 2 [[id number]] Collected: 06/22/22 0433    Order Status: Completed Specimen: Blood Updated: 06/28/22 0608     Special Requests NO SPECIAL REQUESTS        Culture NO GROWTH 6 DAYS       Culture, Wound Aerobic Only [[id number]] Collected: 06/20/22 2230    Order Status: Canceled Specimen: Sacrum     Blood Culture 2 [[id number]] Collected: 06/20/22 1820    Order Status: Completed Specimen: Blood Updated: 06/26/22 0707     Special Requests NO SPECIAL REQUESTS        Culture NO GROWTH 6 DAYS       Respiratory Panel, Molecular, with COVID-19 (Restricted: peds pts or suitable admitted adults) [[id number]] Collected: 06/20/22 1820    Order Status: Completed Specimen: Nasopharyngeal Updated: 06/20/22 1926     Adenovirus by PCR Not detected        Coronavirus 229E by PCR Not detected        Coronavirus HKU1 by PCR Not detected        Coronavirus NL63 by PCR Not detected        Coronavirus OC43 by PCR Not detected        SARS-CoV-2, PCR Not detected        Human Metapneumovirus by PCR Not detected        Rhinovirus Enterovirus PCR Not detected        Influenza A by PCR Not detected        Influenza B PCR Not detected        Parainfluenza 1 PCR Not detected        Parainfluenza 2 PCR Not detected        Parainfluenza 3 PCR Not detected        Parainfluenza 4 PCR Not detected        Respiratory Syncytial Virus by PCR Not detected        Bordetella parapertussis by PCR Not detected        Bordetella pertussis by PCR Not detected        Chlamydophila Pneumonia PCR Not detected        Mycoplasma pneumo by PCR Not detected       Culture, Blood, PCR ID Panel [[id number]]  (Abnormal) Collected: 06/20/22 1820    Order Status: Completed Specimen: Blood Updated: 06/21/22 1848     Accession Number [id number]  Enterococcus faecalis by PCR Not detected        Enterococcus faecium by PCR Not  detected        Listeria monocytogenes by PCR Not detected        STAPHYLOCOCCUS Not detected        Staphylococcus Aureus Not detected        Staphylococcus epidermidis by PCR Not detected        Staphylococcus lugdunensis by PCR Not detected        STREPTOCOCCUS Not detected        Streptococcus agalactiae (Group B) Not detected        Strep pneumoniae Not detected        Strep pyogenes,(Grp. A) Not detected        Acinetobacter calcoac baumannii complex by PCR Not detected        Bacteroides fragilis by PCR Detected        Enterobacteriaceae by PCR Not detected        Enterobacter cloacae complex by PCR Not detected        Escherichia Coli Not detected        Klebsiella aerogenes by PCR Not detected        Klebsiella oxytoca by PCR Not detected        Klebsiella pneumoniae group by PCR Not detected        Proteus by PCR Not detected        Salmonella species by PCR Not detected        Serratia marcescens by PCR Not detected        Haemophilus Influenzae by PCR Not detected        Neisseria meningitidis by PCR Not detected        Pseudomonas aeruginosa Not detected        Stenotrophomonas maltophilia by PCR Not detected        Candida albicans by PCR Not detected        Candida auris by PCR Not detected        Candida glabrata Not detected        Candida krusei by PCR Not detected        Candida parapsilosis by PCR Not detected        Candida tropicalis by PCR Not detected        Cryptococcus neoformans/gattii by PCR Not detected        Resistant gene targets          [employer] test comment       False positive results may rarely occur. Correlate with clinical,epidemiologic, and other laboratory findings           Comment: Please see BCID Interpretation Guide in EPIC Links       Blood Culture 1 [[id number]]  (Abnormal) Collected: 06/20/22 1800    Order Status: Completed Specimen: Blood Updated: 06/23/22 1031     Special Requests NO SPECIAL REQUESTS        Gram stain       ANAEROBIC BOTTLE Gram negative rods                   SMEAR CALLED TO AND CORRECTLY REPEATED BY: RN W BARKER SVTSD ON [id number] AT 1836 HRS TO 1396           Culture       Bacteroides fragilis BETA LACTAMASE POSITIVE GROWING IN 1 OF 2 BOTTLES DRAWN SITE=RFA  RADIOLOGY:    All available imaging studies/reports in connect care for this admission were reviewed      Disclaimer: Sections of this note are dictated utilizing voice recognition software, which may have resulted in some phonetic based errors in grammar and contents. Even though attempts were made to correct all the mistakes, some may have been missed, and remained in the body of the document. If questions arise, please contact our department.    Dr. Raiford Simmonds, Infectious Disease Specialist  [phone]  July 01, 2022  4:11 PM

## 2022-07-01 NOTE — Care Coordination-Inpatient (Addendum)
Met with patient and daughter at bedside. Reviewed accepting facilities. Dtr not agreeable to accepting facilities. Additional [place] facilities. [facility], [place] and [facility]. Referrals sent in cc link and careport.     Declined by ARU    Karin Pinedo, MSW  Care Management

## 2022-07-01 NOTE — Plan of Care (Signed)
Problem: Physical Therapy - Adult  Goal: By Discharge: Performs mobility at highest level of function for planned discharge setting.  See evaluation for individualized goals.  Description: Note: Physical Therapy Goals:  Initiated 06/21/2022 to be met within 7-10 days. Re-evaluated 06/30/22     1.  Patient will move from supine to sit and sit to supine  in bed with minimal assistance/contact guard assist.    2.  Patient will transfer from bed to chair and chair to bed with minimal assistance/contact guard assist using the least restrictive device.  3.  Patient will perform sit to stand with minimal assistance/contact guard assist.  4.  Patient will ambulate with minimal assistance/contact guard assist for 15 feet with the least restrictive device.   5.  Patient will ascend/descend 3 stairs with 1 handrail(s) with moderate assistance .     PLOF: used rolling walker for ambulation around home, lives with daughter who assists with ADLs, 1 story, 3 stairs to enter with handrailing  Outcome: Progressing     PHYSICAL THERAPY TREATMENT    Patient: [name] Short (66 y.o. female)  Date: 07/01/2022  Diagnosis: Septicemia ([place]) [A41.9]  Sepsis ([place]) [A41.9] Severe sepsis ([place])  Procedure(s) (LRB):  INCISION AND DRAINAGE /DEBRIDEMENT SACRAL DECUBITUS (N/A) 7 Days Post-Op  Precautions: Fall Risk, General Precautions      ASSESSMENT:  Pt received in bed in NAD and agreeable. Pt required mod A to sit up at EOB followed by mod A x 2 for SPT to turn and sit in recliner. Pt with decreased step clearance and step length, assist given for manual weight shifting for taking steps. Pt with increased difficulty picking up feet to step posteriorly in prep for sitting in recliner. Pt continue to progress towards goals. Left up in recliner eating lunch.     Progression toward goals:   [x]      Improving appropriately and progressing toward goals  []      Improving slowly and progressing toward goals  []      Not making progress toward goals  and plan of care will be adjusted     PLAN:  Patient continues to benefit from skilled intervention to address the above impairments.  Continue treatment per established plan of care.    Further Equipment Recommendations for Discharge: rolling walker    AMPAC:    14    Current research shows that an AM-PAC score of 17 (13 without stairs) or less is not associated with a discharge to the patient's home setting. Based on an AM-PAC score and their current functional mobility deficits, it is recommended that the patient have 5-7 sessions per week of Physical Therapy at d/c to increase the patient's independence.  Currently, this patient demonstrates the potential endurance, and/or tolerance for 3 hours of therapy each day at d/c.    This AMPAC score should be considered in conjunction with interdisciplinary team recommendations to determine the most appropriate discharge setting. Patient's social support, diagnosis, medical stability, and prior level of function should also be taken into consideration.     SUBJECTIVE:   Patient stated, "I sat up in that chair for 4 hours."    OBJECTIVE DATA SUMMARY:   Critical Behavior:  Orientation  Overall Orientation Status: Within Normal Limits  Orientation Level: Oriented X4       Functional Mobility Training:  Bed Mobility:  Bed Mobility Training  Bed Mobility Training: Yes  Supine to Sit: Additional time;Moderate assistance  Scooting: Minimum assistance  Transfers:  Pharmacologist: Yes  Sit to Stand: Moderate assistance;Assist X2  Stand to Sit: Minimum assistance;Assist X1  Stand Pivot Transfers: Moderate assistance;Assist X2  Balance:  Balance  Sitting: With support  Sitting - Static: Good (unsupported)  Sitting - Dynamic: Fair (occasional)  Standing: Impaired  Standing - Static: Fair  Standing - Dynamic: Fair    Pain:  Pain level pre-treatment:not rated, sacral pain /10  Pain level post-treatment: "    " /10   Pain Intervention(s): Rest, Ice,  Repositioning      Activity Tolerance:   Activity Tolerance: Patient limited by pain;Patient limited by endurance  Please refer to the flowsheet for vital signs taken during this treatment.  After treatment:   [x] Patient left in no apparent distress sitting up in chair  [] Patient left in no apparent distress in bed  [x] Call bell left within reach  [x] Nursing notified  [] Caregiver present  [] Bed alarm activated  [] SCDs applied      COMMUNICATION/EDUCATION:   Patient Education  Education Given To: Patient;Family  Education Provided: Print production planner;Fall Prevention Strategies  Education Method: Demonstration;Verbal;Teach Back  Barriers to Learning: None  Education Outcome: Verbalized understanding;Continued education needed      [name] Short, PT  Minutes: 29

## 2022-07-01 NOTE — Plan of Care (Addendum)
Problem: Occupational Therapy - Adult  Goal: By Discharge: Performs self-care activities at highest level of function for planned discharge setting.  See evaluation for individualized goals.  Description: Occupational Therapy Goals:  Initiated 06/21/2022, Re-evaluated s/p I &D; goals adjusted to be met within 7-10 days.    1.  Patient will perform bed mobility for ADLs and repositioning with moderate assistance.  2.  Patient will perform upper body dressing with supervision/set-up.  3.  Patient will perform functional activity at EOB with supervision/set-up, F balance.  4.  Patient will perform bathing with minimal assistance.  5.  Patient will participate in upper extremity therapeutic exercise/activities with supervision/set-up for 8 minutes to improve endurance and UB strength needed for ADLs    6.  Patient will utilize energy conservation techniques during functional activities with verbal cues.      PLOF: Pt lives with daughter, reports recently has been working with HHPT on walking, was [facility] from [facility] due to being able to complete her ADLs with set-up/ AE (sock-aid), daughter and PCA assisting mostly with IADLs. PCA comes in intermittently during the week, daughter is pt's primary caregiver. Per pt and daughter since 1 week ago pt requires significant amount of assist for functional mobility.   Outcome: Progressing   OCCUPATIONAL THERAPY TREATMENT    Patient: [name] Short (66 y.o. female)  Date: 07/01/2022  Diagnosis: Septicemia ([place]) [A41.9]  Sepsis ([place]) [A41.9] Severe sepsis ([place])  Procedure(s) (LRB):  INCISION AND DRAINAGE /DEBRIDEMENT SACRAL DECUBITUS (N/A) 7 Days Post-Op  Precautions: Fall Risk, General Precautions,  ,  ,  ,  ,  ,  ,      Chart, occupational therapy assessment, plan of care, and goals were reviewed.  ASSESSMENT:  Pt presented supine in bed upon entry and agreeable for participation. Pt required MOD A to roll L/R to change soiled chux pads from urine and MAX A for peri area hygiene and  donning adult brief @ bed level. Pt was assisted to EOB MOD A in prep for functional tasks. STS transfer MOD A x 2 with RW. Pt performed SPT MOD A x 2 with RW to reclining chair, simulating BSC transfer. Pt noted having difficulty advancing B LE's requiring mod vc's for safety and sequencing. Pt was left sitting in chair at end of session with B LE's elevated and all needs within reach. RN made aware.   Progression toward goals:  _0           Improving appropriately and progressing toward goals  _1           Improving slowly and progressing toward goals  _2           Not making progress toward goals and plan of care will be adjusted     PLAN:  Patient continues to benefit from skilled intervention to address the above impairments.  Continue treatment per established plan of care.    Further Equipment Recommendations for Discharge: BSC, RW, W/C    AMPAC: AM-PAC Inpatient Daily Activity Raw Score: 15    Current research shows that an AM-PAC score of 17 or less is not associated with a discharge to the patient's home setting. Based on an AM-PAC score and their current ADL deficits; it is recommended that the patient have 5-7 sessions per week of Occupational Therapy at d/c to increase the patient's independence.  Currently, this patient demonstrates the potential endurance, and/or tolerance for 3 hours of therapy each day at d/c.      This AMPAC score  should be considered in conjunction with interdisciplinary team recommendations to determine the most appropriate discharge setting. Patient's social support, diagnosis, medical stability, and prior level of function should also be taken into consideration.     SUBJECTIVE:   Patient stated, "I sat up too long yesterday."    OBJECTIVE DATA SUMMARY:     Functional Mobility and Transfers for ADLs:   Bed Mobility:  Bed Mobility Training  Supine to Sit: Moderate assistance  Scooting: Minimum assistance   Transfers:  Transfer Training  Transfer Training: Yes  Sit to Stand:  Moderate assistance;Assist X2  Stand to Sit: Minimum assistance;Assist X1  Stand Pivot Transfers: Moderate assistance;Assist X2    Balance:  Balance  Sitting: With support  Sitting - Static: Good (unsupported)  Sitting - Dynamic: Fair (occasional)  Standing: Impaired  Standing - Static: Fair  Standing - Dynamic: Fair (-)    ADL Intervention:     LE Dressing: Maximum assistance  Toileting: Maximum assistance        Short:  Pt did not rate sacral Short on scale at this time.     Activity Tolerance:    Activity Tolerance: Patient limited by Short;Patient limited by endurance  Please refer to the flowsheet for vital signs taken during this treatment.  After treatment:   _0   Patient left in no apparent distress sitting up in chair  _1   Patient left in no apparent distress in bed  _2   Call bell left within reach  _3   Nursing notified  _4   Caregiver present  _5   Bed alarm activated    COMMUNICATION/EDUCATION:   Patient Education  Education Given To: Patient  Education Provided: Role of Therapy;Plan of Museum/gallery exhibitions officer;Fall Prevention Strategies;Energy Conservation  Education Method: Retail banker  Barriers to Learning: None  Education Outcome: Continued education needed      Thank you for this referral.  Stephani Police Jerod Mcquain, OTA  Minutes: 29

## 2022-07-01 NOTE — Care Coordination-Inpatient (Signed)
11:00 am  OT/PT recommend ARU for Transition of care. Spoke with ARU liaison, Manuela Schwartz, who is reviewing patient for acceptance.     Wilferd Ritson, MSW  Care Management

## 2022-07-01 NOTE — Progress Notes (Signed)
[place] Pharmacokinetic Monitoring Service - Vancomycin    Indication: sepsis  Goal AUC/MIC: 400-600 mg*hr/L  Day of Therapy: 11  Additional Antimicrobials: pip-tazo    Pertinent Laboratory Values:   Wt Readings from Last 1 Encounters:   06/22/22 81.1 kg (178 lb 12.7 oz)     Temp Readings from Last 1 Encounters:   07/01/22 97.8 F (36.6 C) (Oral)     Estimated Creatinine Clearance: 60 mL/min (based on SCr of 0.92 mg/dL).    No results for input(s): "CREATININE", "BUN", "WBC" in the last 72 hours.    Pertinent Cultures:  Date Source Results   11/17 blood Bacteroides in 1/2   11/19 blood NGF   11/21 wound MRSA, CoNS   MRSA Nasal Swab: NA    Assessment:  Date Current Dose Level (mg/L) Timing of Level (h) AUC/MIC   11/18 1,500 mg x1  750 mg x1 - - -   11/19 1,000 mg q18h 20.3 4 461   11/20 1,000 mg q18h - - -   11/21 1,000 mg q18h 12.3 13 477   11/22 1,000 mg q18h - - -   11/23 1,000 mg q18h - - -   11/24 1,000 mg q18h - - -   11/25 1,000 mg q18h - - -   11/26 1,000 mg q18h - - -   11/27 1000mg  q18h      11/28 1000mg  q18h  1500mg  q24h 13.6   9.75 401   Note: Serum concentrations collected for AUC dosing may appear elevated if collected in close proximity to the dose administered, this is not necessarily an indication of toxicity    Plan:  Adjust vancomycin to 1500mg  q24h,  projected AUCss/T = 447/10.4  Repeat level as indicated  Pharmacy will continue to monitor patient and adjust therapy as indicated    Thank you for the consult,  [place], [facility]  07/01/2022

## 2022-07-01 NOTE — Progress Notes (Signed)
Hospitalist Progress Note    NAME:  [name] Short   DOB:   07-Jun-1956   MRN:   [id number]     Date/Time:  07/01/2022  Subjective:   Chief Complaint:    Pt has no cp no sob;       Past Med History and Social history reviewed. No changes.   [x]   Current Medication list and allergies reviewed*    Objective:   Vitals:  BP (!) 135/59   Pulse 68   Temp 97.8 F (36.6 C) (Oral)   Resp 20   Ht 1.499 m (4' 11.02")   Wt 81.1 kg (178 lb 12.7 oz)   SpO2 100%   BMI 36.09 kg/m   Temp (24hrs), Avg:98.1 F (36.7 C), Min:97.8 F (36.6 C), Max:98.5 F (36.9 C)      Last 24hr Input/Output:    Intake/Output Summary (Last 24 hours) at 07/01/2022 1530  Last data filed at 07/01/2022 1156  Gross per 24 hour   Intake 480 ml   Output 1325 ml   Net -845 ml        PHYSICAL EXAM:  Gen:  []   WD [x]   WN  []   cachectic  []   thin  []   obese  []   disheveled             []    Critically ill or  []   Chronically ill appearing    HEENT:   []    red/pink conjunctivae []   pale conjunctivae                  PERRL  []   yes  []   no        hearing intact to voice []   yes  []   No               [x]   moist or  []   dry  Mucosa  NECK:   supple []   yes  []   no      masses []   yes  []   No               thyroid    []   non tender []   tender    RESP:   use of accessory muscles []   yes [x]   no                BS    [x]   clear  []   wheezing []   rhonchi []   crackles   CARD:  murmur  []   yes [x]   no   []   thrill  []   carotid bruits                 LE edema []   yes  []   no    []   JVD present    ABD:    tenderness []   yes [x]   no   Liver/spleen enlargement []    yes []    no                distended []    yes [x]   no    bowel sound []   normal []   hypo or  []   hyperactive    MSKL:   cyanosis/clubbing []   yes []   no         []    Spastic []   Flaccid []   Cog wheeling      NEUR:   []   cranial nerves intact       []   L []   R weakness    []   follows commands     []    aphasic    [x]   alert  []   lethargic  []   stuporous  []   sedated             Alert and Oriented  []   time  []    place   []   person  PSYCH:   insight []   poor []   good     mood []   depressed []   anxious []   agitated                   []   Telemetry Reviewed     []   NSR []   PAC/PVCs   []   Afib  []   Paced   []   NSVT   []   Foley []   NGT  []   Intubated on vent    Lab Data Reviewed:  No components found for: "GLPOC"  No results for input(s): "NA", "K", "CL", "CO2", "BUN", "CREA", "GLU", "PHOS", "CA", "ALB", "WBC", "HGB", "HCT", "PLT" in the last 72 hours.       []     I have personally reviewed the   []   xray  []   CT scan    Assessment/Plan:     Principal Problem:    Severe sepsis ([place])  Active Problems:    Obstructive sleep apnea syndrome    Diabetes ([place])    Decubitus ulcer of sacral region, stage 4 (HCC)    Hypertension    History of CVA (cerebrovascular accident)    Hyperlipidemia    Sacral osteomyelitis ([place])  Resolved Problems:    * No resolved hospital problems. *     ___________________________________________________  PLAN:  Risk of deterioration:  []   Low    []   Moderate  []   High       Patient admitted from home on 11/17 secondary to infection of stage IV decubitus sacral ulcer. She was receiving wound care at home but noticed increased pain and drainage. Patient underwent I&D and debridement with Dr. Bing Ree ([place]) on 11/21. Intra-op cultures were obtained. Dr. Posey Pronto (ID) following for antibiotic recs.     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 -->resolved. Remains on vanc and zosyn. ID recommends outpt IV abx, s/p PICC line.    Has wound Vacc on;    Bacteroides bacteremia, wound infection -    #1. Surgery (Drs. Forest Gleason, Criselda Peaches) following. Recommendation made for wound vac to sacrum, wound vac placed.      Continue vanc, Zosyn.thru 08/07/22; s/p Picc line;     Wound care consulted. Blood cultures redrawn on 11/19, NGTD. intra-op cultures-->MRSA (prelim/antigen detection).     Repeat b/c x 2; so fare negative;    DMT2, insulin dependent with  hyperglycemia - SSI + Lantus     Hypertension, essential - with high BP; add coreg; and on Norvasc;    History of CVA - on ASA, statin     Hyperlipidemia - continue statin.     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 with external bone growth stimulator placement on Dec 09, 2021     Sleep apnea noncompliant with CPAP    __________________________________________________    Total time spent with patient:     minutes    []   Critical Care time:      minutes    Care Plan discussed with:    [x]   Patient   []   Family    []   Care Manager  [x]   Nursing   []   Consultant/Specialist :      []     >50% of visit spent in counseling and coordination of care   (Discussed []   CODE status,  []   Care Plan, []   D/C Planning)    Prophylaxis:  []   Lovenox  []   Coumadin  [x]   Hep SQ  []   SCD's  []   H2B/PPI    Disposition:  []   Home w/ Family   []   HH PT,OT,RN   [x]   SNF/LTC   []   SAH/Rehab  ___________________________________________________    Hospitalist: Daine GravelKanwarpreet S Aarionna Germer, MD     ___________________________________________________    *Medications reviewed:  Current Facility-Administered Medications   Medication Dose Route Frequency    vancomycin (VANCOCIN) 1500 mg in sodium chloride 0.9% 500 mL IVPB  1,500 mg IntraVENous Q24H    carvedilol (COREG) tablet 6.25 mg  6.25 mg Oral BID WC    insulin glargine (LANTUS) injection vial 30 Units  30 Units SubCUTAneous Nightly    insulin lispro (HUMALOG) injection vial 7 Units  7 Units SubCUTAneous TID WC    insulin lispro (HUMALOG) injection vial 0-15 Units  0-15 Units SubCUTAneous 4x Daily AC & HS    amLODIPine (NORVASC) tablet 5 mg  5 mg Oral Daily    0.9 % sodium chloride infusion   IntraVENous PRN    lactobacillus (CULTURELLE) capsule 1 capsule  1 capsule Oral Daily with breakfast    sodium chloride flush 0.9 % injection 5-40 mL  5-40 mL IntraVENous 2 times per day    sodium chloride flush 0.9 % injection 5-40 mL  5-40 mL IntraVENous PRN    potassium  chloride (KLOR-CON M) extended release tablet 40 mEq  40 mEq Oral PRN    Or    potassium bicarb-citric acid (EFFER-K) effervescent tablet 40 mEq  40 mEq Oral PRN    Or    potassium chloride 10 mEq/100 mL IVPB (Peripheral Line)  10 mEq IntraVENous PRN    magnesium sulfate 2000 mg in 50 mL IVPB premix  2,000 mg IntraVENous PRN    ondansetron (ZOFRAN-ODT) disintegrating tablet 4 mg  4 mg Oral Q8H PRN    Or    ondansetron (ZOFRAN) injection 4 mg  4 mg IntraVENous Q6H PRN    polyethylene glycol (GLYCOLAX) packet 17 g  17 g Oral Daily PRN    famotidine (PEPCID) tablet 20 mg  20 mg Oral Daily    acetaminophen (TYLENOL) tablet 650 mg  650 mg Oral Q6H PRN    Or    acetaminophen (TYLENOL) suppository 650 mg  650 mg Rectal Q6H PRN    glucose chewable tablet 16 g  4 tablet Oral PRN    dextrose bolus 10% 125 mL  125 mL IntraVENous PRN    Or    dextrose bolus 10% 250 mL  250 mL IntraVENous PRN    glucagon injection 1 mg  1 mg SubCUTAneous PRN    dextrose 10 % infusion   IntraVENous Continuous PRN    piperacillin-tazobactam (ZOSYN) 3,375 mg in sodium chloride 0.9 % 50 mL IVPB (mini-bag)  3,375 mg IntraVENous Q8H    heparin (porcine) injection 5,000 Units  5,000 Units SubCUTAneous 3 times per day    baclofen (LIORESAL) tablet 10 mg  10 mg Oral BID    aspirin tablet 325 mg  325 mg Oral Daily    atorvastatin (LIPITOR) tablet 80 mg  80 mg Oral Nightly    ferrous  sulfate (IRON 325) tablet 325 mg  325 mg Oral Daily

## 2022-07-02 ENCOUNTER — Encounter: Payer: PRIVATE HEALTH INSURANCE | Primary: Physician Assistant

## 2022-07-02 LAB — POCT GLUCOSE
POC Glucose: 130 mg/dL — ABNORMAL HIGH (ref 70–110)
POC Glucose: 145 mg/dL — ABNORMAL HIGH (ref 70–110)
POC Glucose: 126 mg/dL — ABNORMAL HIGH (ref 70–110)
POC Glucose: 153 mg/dL — ABNORMAL HIGH (ref 70–110)

## 2022-07-02 MED ORDER — INSULIN GLARGINE 100 UNIT/ML SC SOLN
100 UNIT/ML | Freq: Every day | SUBCUTANEOUS | 2 refills | Status: AC
Start: 2022-07-02 — End: 2022-08-25

## 2022-07-02 MED ORDER — FAMOTIDINE 20 MG PO TABS
20 MG | ORAL_TABLET | Freq: Every day | ORAL | 3 refills | Status: AC
Start: 2022-07-02 — End: 2022-08-25

## 2022-07-02 MED ORDER — INSULIN LISPRO 100 UNIT/ML IJ SOLN
100 UNIT/ML | Freq: Three times a day (TID) | INTRAMUSCULAR | 0 refills | Status: DC
Start: 2022-07-02 — End: 2022-08-25

## 2022-07-02 MED ORDER — CARVEDILOL 6.25 MG PO TABS
6.25 MG | ORAL_TABLET | Freq: Two times a day (BID) | ORAL | 3 refills | Status: AC
Start: 2022-07-02 — End: ?

## 2022-07-02 MED ORDER — CULTURELLE PO CAPS
ORAL_CAPSULE | Freq: Every day | ORAL | 0 refills | Status: AC
Start: 2022-07-02 — End: ?

## 2022-07-02 MED ORDER — VANCOMYCIN HCL 1 G IV SOLR
1 g | INTRAVENOUS | Status: AC
Start: 2022-07-02 — End: 2022-08-06

## 2022-07-02 MED ORDER — PIPERACILLIN-TAZOBACTAM (ZOSYN) INFUSION (OUTPATIENT ONLY)
Freq: Four times a day (QID) | 1 refills | Status: AC
Start: 2022-07-02 — End: 2022-08-07

## 2022-07-02 MED ORDER — VANCOMYCIN (VANCOCIN) INFUSION (OUTPATIENT ONLY)
1 refills | Status: AC
Start: 2022-07-02 — End: 2022-08-07

## 2022-07-02 MED ORDER — CARBAMIDE PEROXIDE 6.5 % OT SOLN
6.5 % | Freq: Once | OTIC | Status: AC
Start: 2022-07-02 — End: 2022-07-02
  Administered 2022-07-02: 20:00:00 5 [drp] via OTIC

## 2022-07-02 MED FILL — BACLOFEN 10 MG PO TABS: 10 MG | ORAL | Qty: 1

## 2022-07-02 MED FILL — PIPERACILLIN SOD-TAZOBACTAM SO 3.375 (3-0.375) G IV SOLR: 3.375 (3-0.375) g | INTRAVENOUS | Qty: 3375

## 2022-07-02 MED FILL — HEPARIN SODIUM (PORCINE) 5000 UNIT/ML IJ SOLN: 5000 UNIT/ML | INTRAMUSCULAR | Qty: 1

## 2022-07-02 MED FILL — INSULIN LISPRO 100 UNIT/ML IJ SOLN: 100 UNIT/ML | INTRAMUSCULAR | Qty: 7

## 2022-07-02 MED FILL — FERROUS SULFATE 325 (65 FE) MG PO TABS: 325 (65 Fe) MG | ORAL | Qty: 1

## 2022-07-02 MED FILL — VANCOMYCIN HCL 1 G IV SOLR: 1 g | INTRAVENOUS | Qty: 0

## 2022-07-02 MED FILL — ASPIRIN ADULT 325 MG PO TABS: 325 MG | ORAL | Qty: 1

## 2022-07-02 MED FILL — CARVEDILOL 6.25 MG PO TABS: 6.25 MG | ORAL | Qty: 1

## 2022-07-02 MED FILL — NORVASC 5 MG PO TABS: 5 MG | ORAL | Qty: 1

## 2022-07-02 MED FILL — ATORVASTATIN CALCIUM 40 MG PO TABS: 40 MG | ORAL | Qty: 2

## 2022-07-02 MED FILL — MURINE EAR 6.5 % OT SOLN: 6.5 % | OTIC | Qty: 15

## 2022-07-02 MED FILL — FAMOTIDINE 20 MG PO TABS: 20 MG | ORAL | Qty: 1

## 2022-07-02 MED FILL — INSULIN GLARGINE 100 UNIT/ML SC SOLN: 100 UNIT/ML | SUBCUTANEOUS | Qty: 30

## 2022-07-02 MED FILL — ACETAMINOPHEN 325 MG PO TABS: 325 MG | ORAL | Qty: 2

## 2022-07-02 MED FILL — CULTURELLE PO CAPS: ORAL | Qty: 1

## 2022-07-02 NOTE — Plan of Care (Signed)
Problem: Nutrition Deficit:  Goal: Optimize nutritional status  07/02/2022 0324 by Claudie Revering, RN  Outcome: Progressing  07/01/2022 1831 by Morley Kos, RN  Outcome: Progressing     Problem: Chronic Conditions and Co-morbidities  Goal: Patient's chronic conditions and co-morbidity symptoms are monitored and maintained or improved  07/02/2022 0324 by Claudie Revering, RN  Outcome: Progressing  07/01/2022 1831 by Morley Kos, RN  Outcome: Progressing  Flowsheets (Taken 07/01/2022 0915)  Care Plan - Patient's Chronic Conditions and Co-Morbidity Symptoms are Monitored and Maintained or Improved:   Monitor and assess patient's chronic conditions and comorbid symptoms for stability, deterioration, or improvement   Collaborate with multidisciplinary team to address chronic and comorbid conditions and prevent exacerbation or deterioration     Problem: Chronic Conditions and Co-morbidities  Goal: Patient's chronic conditions and co-morbidity symptoms are monitored and maintained or improved  07/02/2022 0324 by Claudie Revering, RN  Outcome: Progressing     Problem: Discharge Planning  Goal: Discharge to home or other facility with appropriate resources  07/02/2022 0324 by Claudie Revering, RN  Outcome: Progressing  07/01/2022 1831 by Morley Kos, RN  Outcome: Progressing  Flowsheets (Taken 07/01/2022 0915)  Discharge to home or other facility with appropriate resources:   Identify barriers to discharge with patient and caregiver   Arrange for needed discharge resources and transportation as appropriate     Problem: Pain  Goal: Verbalizes/displays adequate comfort level or baseline comfort level  07/02/2022 0324 by Claudie Revering, RN  Outcome: Progressing  07/01/2022 1831 by Morley Kos, RN  Outcome: Progressing     Problem: Skin/Tissue Integrity  Goal: Absence of new skin breakdown  Description: 1.  Monitor for areas of redness and/or skin breakdown  2.  Assess vascular access sites  hourly  3.  Every 4-6 hours minimum:  Change oxygen saturation probe site  4.  Every 4-6 hours:  If on nasal continuous positive airway pressure, respiratory therapy assess nares and determine need for appliance change or resting period.  07/02/2022 0324 by Claudie Revering, RN  Outcome: Progressing  07/01/2022 1831 by Morley Kos, RN  Outcome: Progressing     Problem: Safety - Adult  Goal: Free from fall injury  07/02/2022 0324 by Claudie Revering, RN  Outcome: Progressing  07/01/2022 1831 by Morley Kos, RN  Outcome: Progressing  Flowsheets (Taken 07/01/2022 0915)  Free From Fall Injury:   Instruct family/caregiver on patient safety   Based on caregiver fall risk screen, instruct family/caregiver to ask for assistance with transferring infant if caregiver noted to have fall risk factors     Problem: ABCDS Injury Assessment  Goal: Absence of physical injury  07/02/2022 0324 by Claudie Revering, RN  Outcome: Progressing  07/01/2022 1831 by Morley Kos, RN  Outcome: Progressing  Flowsheets (Taken 07/01/2022 0915)  Absence of Physical Injury: Implement safety measures based on patient assessment     Problem: Occupational Therapy - Adult  Goal: By Discharge: Performs self-care activities at highest level of function for planned discharge setting.  See evaluation for individualized goals.  Description: Occupational Therapy Goals:  Initiated 06/21/2022, Re-evaluated s/p I &D; goals adjusted to be met within 7-10 days.    1.  Patient will perform bed mobility for ADLs and repositioning with moderate assistance.  2.  Patient will perform upper body dressing with supervision/set-up.  3.  Patient will perform functional activity at EOB with supervision/set-up, F balance.  4.  Patient will perform bathing with minimal  assistance.  5.  Patient will participate in upper extremity therapeutic exercise/activities with supervision/set-up for 8 minutes to improve endurance and UB strength needed for ADLs    6.   Patient will utilize energy conservation techniques during functional activities with verbal cues.      PLOF: Pt lives with daughter, reports recently has been working with HHPT on walking, was [facility] from [place] due to being able to complete her ADLs with set-up/ AE (sock-aid), daughter and PCA assisting mostly with IADLs. PCA comes in intermittently during the week, daughter is pt's primary caregiver. Per pt and daughter since 1 week ago pt requires significant amount of assist for functional mobility.   07/01/2022 1506 by Shirlee Latch L, OTA  Outcome: Progressing     Problem: Physical Therapy - Adult  Goal: By Discharge: Performs mobility at highest level of function for planned discharge setting.  See evaluation for individualized goals.  Description: Note: Physical Therapy Goals:  Initiated 06/21/2022 to be met within 7-10 days. Re-evaluated 06/30/22     1.  Patient will move from supine to sit and sit to supine  in bed with minimal assistance/contact guard assist.    2.  Patient will transfer from bed to chair and chair to bed with minimal assistance/contact guard assist using the least restrictive device.  3.  Patient will perform sit to stand with minimal assistance/contact guard assist.  4.  Patient will ambulate with minimal assistance/contact guard assist for 15 feet with the least restrictive device.   5.  Patient will ascend/descend 3 stairs with 1 handrail(s) with moderate assistance .     PLOF: used rolling walker for ambulation around home, lives with daughter who assists with ADLs, 1 story, 3 stairs to enter with handrailing  07/01/2022 1520 by Idalia Needle, PT  Outcome: Progressing     Problem: Chronic Conditions and Co-morbidities  Goal: Patient's chronic conditions and co-morbidity symptoms are monitored and maintained or improved  07/02/2022 0324 by Claudie Revering, RN  Outcome: Progressing  07/01/2022 1831 by Morley Kos, RN  Outcome: Progressing  Flowsheets (Taken 07/01/2022  0915)  Care Plan - Patient's Chronic Conditions and Co-Morbidity Symptoms are Monitored and Maintained or Improved:   Monitor and assess patient's chronic conditions and comorbid symptoms for stability, deterioration, or improvement   Collaborate with multidisciplinary team to address chronic and comorbid conditions and prevent exacerbation or deterioration     Problem: Nutrition Deficit:  Goal: Optimize nutritional status  07/02/2022 0324 by Claudie Revering, RN  Outcome: Progressing  07/01/2022 1831 by Morley Kos, RN  Outcome: Progressing

## 2022-07-02 NOTE — Discharge Summary (Signed)
Hospitalist Discharge Summary     Patient ID:  [name] Short  [id number]  66 y.o.  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 She was receiving wound care at home but noticed increased pain and drainage. Patient underwent I&D and debridement with Dr. Bing Ree ([place]) on 11/21. Intra-op cultures were obtained. Dr. Posey Pronto (ID) following for antibiotic recs.     Postop cultures grew out Bacteroides MRSA;    Blood Cultures also grew out Bacteroides.  Repeat blood cultures are negative;    Has been on IV antibiotics as per ID.  She is status post PICC line.    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 -->resolved. Remains on vanc and zosyn. ID recommends outpt IV abx, s/p PICC line.     Has wound Vacc on;     Bacteroides bacteremia, wound infection -    #1. Surgery (Drs. Forest Gleason, Criselda Peaches) following. Recommendation made for wound vac to sacrum, wound vac placed.       Continue vanc, Zosyn.thru 08/07/22; s/p Picc line;      Wound care consulted. Blood cultures redrawn on 11/19, NGTD. intra-op cultures-->MRSA (prelim/antigen detection).      Repeat b/c x 2; so fare negative;     DMT2, insulin dependent with hyperglycemia - SSI + Lantus      Hypertension, essential - with high BP; add coreg; and on Norvasc;     History of CVA - on ASA, statin      Hyperlipidemia - continue statin.      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 with external bone growth stimulator placement on Dec 09, 2021      Sleep apnea noncompliant with CPAP                                                        Consults: ID and general  surgery          Pertinent Lab Data:  No results for input(s): "WBC", "HGB", "HCT", "PLT" in the last 72 hours.  No results for input(s): "NA", "K", "CL", "CO2", "GLU", "BUN", "CREA", "CA", "MG", "PHOS", "ALB", "ALT", "INR" in the last 72 hours.    Invalid input(s): "TBIL", "SGOT"    DISCHARGE MEDICATIONS:  @     Medication List        START taking these medications      carvedilol 6.25 MG tablet  Commonly known as: COREG  Take 1 tablet by mouth 2 times daily (with meals)     famotidine 20 MG tablet  Commonly known as: PEPCID  Take 1 tablet by mouth daily  Start taking on: July 03, 2022     lactobacillus capsule  Take 1 capsule by mouth daily (with breakfast)  Start taking on: July 03, 2022     piperacillin-tazobactam  infusion  Commonly known as: ZOSYN  Infuse 3.375 g intravenously every 6 hours Compound per protocol.     vancomycin  infusion  Commonly known as: VANCOCIN  Infuse 1,500 mg intravenously every 24 hours Compound per protocol.            CHANGE how you take these medications      * insulin lispro 100 UNIT/ML Soln injection vial  Commonly known as: HUMALOG  Sliding Scale insulin as per Institutional protocol  What changed: Another medication with the same name was changed. Make sure you understand how and when to take each.     * insulin lispro 100 UNIT/ML Soln injection vial  Commonly known as: HUMALOG  Inject 7 Units into the skin 3 times daily (with meals)  What changed: how much to take           * This list has 2 medication(s) that are the same as other medications prescribed for you. Read the directions carefully, and ask your doctor or other care provider to review them with you.                CONTINUE taking these medications      acetaminophen 500 MG tablet  Commonly known as: TYLENOL  Take 1 tablet by mouth every 6 hours as needed for Fever or Pain     aspirin 325 MG tablet     atorvastatin 80 MG tablet  Commonly known as: LIPITOR  Take 1 tablet by mouth nightly     baclofen 10 MG  tablet  Commonly known as: LIORESAL     ferrous sulfate 325 (65 Fe) MG tablet  Commonly known as: IRON 325  Take 1 tablet by mouth daily     furosemide 40 MG tablet  Commonly known as: LASIX     insulin glargine 100 UNIT/ML injection vial  Commonly known as: LANTUS  Inject 30 Units into the skin daily     Magnesium Oxide 400 MG Caps     therapeutic multivitamin-minerals tablet  Take 1 tablet by mouth daily            STOP taking these medications      KLOR-CON M10 PO     Sensipar 30 MG tablet  Generic drug: cinacalcet               Where to Get Your Medications        These medications were sent to [id number] - [place], [place] - [address] - P [phone] - F [phone]  [address] [place], [place] [address]      Phone: [phone]   carvedilol 6.25 MG tablet  famotidine 20 MG tablet  insulin lispro 100 UNIT/ML Soln injection vial       Information about where to get these medications is not yet available    Ask your nurse or doctor about these medications  insulin glargine 100 UNIT/ML injection vial  lactobacillus capsule  piperacillin-tazobactam  infusion  vancomycin  infusion             My Recommended Diet, Activity, Wound Care, and follow-up labs are listed in the patient's Discharge Insturctions which I have personally completed and reviewed.      Disposition:     []  Home with family     []  HH PT/RN   [x]  SNF/NH   []  Inpatient Rehab/SAHR  Condition at Discharge:  Stable    Follow up with:   PCP : , [place]      >30  minutes spent coordinating this discharge (review instructions/follow-up, prescriptions, preparing report for sign off)    Signed:  Daine Gravel, MD  07/02/2022  2:00 PM

## 2022-07-02 NOTE — Care Coordination-Inpatient (Addendum)
12:39 pm  Auth started. Wound vac need to be delivered to facility. LTSS/UAI already scanned in epic. Completed by ARU Education officer, museum, A. Brown 01/31/2022    12:00 pm   Met with patient and daughter at bedside. Reviewed accepting facilities. Selected facility: [facility]. CM spoke with Linward Foster, admissions at [facility], who will speak with administrator regarding wound vac and IV abx. Advised Zandra to start [place].     Ajmal Kathan, MSW  Care Management

## 2022-07-02 NOTE — Discharge Instructions (Addendum)
Wound care and Wound Vac as per DR Bing Ree      DISCHARGE SUMMARY from Nurse    PATIENT INSTRUCTIONS:    After general anesthesia or intravenous sedation, for 24 hours or while taking prescription Narcotics:  Limit your activities  Do not drive and operate hazardous machinery  Do not make important personal or business decisions  Do  not drink alcoholic beverages  If you have not urinated within 8 hours after discharge, please contact your surgeon on call.    Report the following to your surgeon:  Excessive pain, swelling, redness or odor of or around the surgical area  Temperature over 100.5  Nausea and vomiting lasting longer than 4 hours or if unable to take medications  Any signs of decreased circulation or nerve impairment to extremity: change in color, persistent  numbness, tingling, coldness or increase pain  Any questions    What to do at Home:  Recommended activity: activity as tolerated,  SNF    If you experience any of the following symptoms  Nausea, vomiting, diarrhea, shortness of breath, dizziness, fainting, infection in wound not healing, please follow up with  primary care team or dial 911 for emergencies.    *  Please give a list of your current medications to your Primary Care Provider.    *  Please update this list whenever your medications are discontinued, doses are      changed, or new medications (including over-the-counter products) are added.    *  Please carry medication information at all times in case of emergency situations.    These are general instructions for a healthy lifestyle:    No smoking/ No tobacco products/ Avoid exposure to second hand smoke  Surgeon General's Warning:  Quitting smoking now greatly reduces serious risk to your health.    Obesity, smoking, and sedentary lifestyle greatly increases your risk for illness    A healthy diet, regular physical exercise & weight monitoring are important for maintaining a healthy lifestyle    You may be retaining fluid if you have a  history of heart failure or if you experience any of the following symptoms:  Weight gain of 3 pounds or more overnight or 5 pounds in a week, increased swelling in our hands or feet or shortness of breath while lying flat in bed.  Please call your doctor as soon as you notice any of these symptoms; do not wait until your next office visit.        The discharge information has been reviewed with the patient.  The patient verbalized understanding.  Discharge medications reviewed with the patient and appropriate educational materials and side effects teaching were provided.  ___________________________________________________________________________________________________________________________________    Patient armband removed and shredded

## 2022-07-02 NOTE — Care Coordination-Inpatient (Signed)
9:30 am   Spoke with Candi at [facility]- do not accept patient insurance.

## 2022-07-02 NOTE — Progress Notes (Signed)
Infectious Disease progress Note        Reason:sepsis, sacral abscess/osteomyelitis, gram negative bloodstream infection    Current abx Prior abx   Zosyn, vancomycin since 06/20/22      Lines:       Assessment :  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  Status post I&D on 06/24/2022.  Intraoperative findings noted.  Necrotic soft tissue/slough noted in the sacrum with purulent fluid.  Pus pocket extended superiorly and laterally to the larger pocket.    IntraOp cultures 11/21-MRSA     Cervical spondylotic myelopathy: s/p  C3, C4, C5, C6 laminectomy; posterior cervical fusion C3, C4, C5, C6; segmental instrumentation, [employer] type, C3, C4, C5, C6 with lateral mass screws on 12/09/21      right hip pain: ? Bursitis ? Blunt trauma. No signs of infection noted on today's exam    Recommendations:     Continue zosyn, vancomycin till 08/07/22  Follow up surgery recommendations regarding sacral wound care  Patient will need outpatient IV antibiotic upon discharge.  Place PICC line for outpatient IV antibiotics  D/c planning per primary team  [facility] pharmacy to adjust antibiotic dosing, monitor vancomycin levels.           Above plan was discussed in details with patient, and primary team.  Please call me if any further questions or concerns. Will continue to participate in the care of this patient.    HPI:    Complains of right hip pain.  No fever, chills.  Was able to sit in the chair yesterday      Past Medical History:   Diagnosis Date    Anemia     Arthritis     Chronic pain     Legs and Shoulders    Diabetes (HCC)     Exposure to asbestos     History of blood transfusion     History of colon polyps     HTN (hypertension)     Hx of blood clots     DVT    Hyperlipemia     Menopause     Pulmonary nodule     Serum calcium elevated     Sleep apnea     not using cpap    Stroke [facility](HCC)        Past Surgical History:   Procedure Laterality Date    BACK SURGERY N/A 06/24/2022    INCISION AND DRAINAGE /DEBRIDEMENT SACRAL DECUBITUS performed by Phineas SemenYeshtokin, Nicole, DO at [facility] MAIN OR    CESAREAN SECTION  1987    COLONOSCOPY N/A 01/13/2017    COLONOSCOPY performed by Levan Hursthomas Duntemann, MD at [facility] ENDOSCOPY    COLONOSCOPY      DILATION AND CURETTAGE OF UTERUS  1995    RECTAL SURGERY N/A 12/28/2021  SACRAL WOUND DEBRIDEMENT INCISION AND DRAINAGE performed by Phineas Semen, DO at [facility] MAIN OR    SPINE SURGERY N/A 12/09/2021    CERVICAL THREE/FOUR/FIVE/SIX LAMINECTOMY FUSION; C-ARM; STRYKER; EXT BONE GROWTH STIM; 23 HR performed by Pamalee Leyden, MD at [facility] MAIN OR       @[email or web address] @    Current Facility-Administered Medications   Medication Dose Route Frequency    vancomycin (VANCOCIN) 1500 mg in sodium chloride 0.9% 500 mL IVPB  1,500 mg IntraVENous Q24H    carvedilol (COREG) tablet 6.25 mg  6.25 mg Oral BID WC    insulin glargine (LANTUS) injection vial 30 Units  30 Units SubCUTAneous Nightly    insulin lispro (HUMALOG) injection vial 7 Units  7 Units SubCUTAneous TID WC    insulin lispro (HUMALOG) injection vial 0-15 Units  0-15 Units SubCUTAneous 4x Daily AC & HS    amLODIPine (NORVASC) tablet 5 mg  5 mg Oral Daily    0.9 % sodium chloride infusion   IntraVENous PRN    lactobacillus (CULTURELLE) capsule 1 capsule   1 capsule Oral Daily with breakfast    sodium chloride flush 0.9 % injection 5-40 mL  5-40 mL IntraVENous 2 times per day    sodium chloride flush 0.9 % injection 5-40 mL  5-40 mL IntraVENous PRN    potassium chloride (KLOR-CON M) extended release tablet 40 mEq  40 mEq Oral PRN    Or    potassium bicarb-citric acid (EFFER-K) effervescent tablet 40 mEq  40 mEq Oral PRN    Or    potassium chloride 10 mEq/100 mL IVPB (Peripheral Line)  10 mEq IntraVENous PRN    magnesium sulfate 2000 mg in 50 mL IVPB premix  2,000 mg IntraVENous PRN    ondansetron (ZOFRAN-ODT) disintegrating tablet 4 mg  4 mg Oral Q8H PRN    Or    ondansetron (ZOFRAN) injection 4 mg  4 mg IntraVENous Q6H PRN    polyethylene glycol (GLYCOLAX) packet 17 g  17 g Oral Daily PRN    famotidine (PEPCID) tablet 20 mg  20 mg Oral Daily    acetaminophen (TYLENOL) tablet 650 mg  650 mg Oral Q6H PRN    Or    acetaminophen (TYLENOL) suppository 650 mg  650 mg Rectal Q6H PRN    glucose chewable tablet 16 g  4 tablet Oral PRN    dextrose bolus 10% 125 mL  125 mL IntraVENous PRN    Or    dextrose bolus 10% 250 mL  250 mL IntraVENous PRN    glucagon injection 1 mg  1 mg SubCUTAneous PRN    dextrose 10 % infusion   IntraVENous Continuous PRN    piperacillin-tazobactam (ZOSYN) 3,375 mg in sodium chloride 0.9 % 50 mL IVPB (mini-bag)  3,375 mg IntraVENous Q8H    heparin (porcine) injection 5,000 Units  5,000 Units SubCUTAneous 3 times per day    baclofen (LIORESAL) tablet 10 mg  10 mg Oral BID    aspirin tablet 325 mg  325 mg Oral Daily    atorvastatin (LIPITOR) tablet 80 mg  80 mg Oral Nightly    ferrous sulfate (IRON 325) tablet 325 mg  325 mg Oral Daily       Allergies: Lactose    Family History   Problem Relation Age of Onset    Diabetes Other 45        parent, NOS    Cancer Father     Hypertension Other 60  parent,NOS    Heart Disease Other 62        parent, NOS    Lung Disease Father      Social History     Socioeconomic History    Marital status: Single      Spouse name: Not on file    Number of children: Not on file    Years of education: Not on file    Highest education level: Not on file   Occupational History    Not on file   Tobacco Use    Smoking status: Former     Packs/day: 1     Types: Cigarettes, Cigars     Quit date: 11/13/2021     Years since quitting: 0.6    Smokeless tobacco: Never   Vaping Use    Vaping Use: Never used   Substance and Sexual Activity    Alcohol use: Not Currently     Comment: occ    Drug use: Never    Sexual activity: Not on file   Other Topics Concern    Not on file   Social History Narrative    Not on file     Social Determinants of Health     Financial Resource Strain: Low Risk  (01/05/2022)    Overall Financial Resource Strain (CARDIA)     Difficulty of Paying Living Expenses: Not hard at all   Food Insecurity: No Food Insecurity (01/05/2022)    Hunger Vital Sign     Worried About Running Out of Food in the Last Year: Never true     Ran Out of Food in the Last Year: Never true   Transportation Needs: No Transportation Needs (02/09/2022)    OASIS A1250: Transportation     Lack of Transportation (Medical): No     Lack of Transportation (Non-Medical): No     Patient Unable or Declines to Respond: No   Physical Activity: Inactive (01/05/2022)    Exercise Vital Sign     Days of Exercise per Week: 0 days     Minutes of Exercise per Session: 0 min   Stress: No Stress Concern Present (01/05/2022)    [employer] of Occupational Health - Occupational Stress Questionnaire     Feeling of Stress : Not at all   Social Connections: Feeling Socially Integrated (02/09/2022)    OASIS D0700: Social Isolation     Frequency of experiencing loneliness or isolation: Never   Recent Concern: Social Connections - Moderately Isolated (01/05/2022)    Social Connection and Isolation Panel [NHANES]     Frequency of Communication with Friends and Family: Three times a week     Frequency of Social Gatherings with Friends and Family: More than three times a week     Attends  Religious Services: More than 4 times per year     Active Member of Clubs or Organizations: No     Attends Banker Meetings: Never     Marital Status: Widowed   Intimate Partner Violence: Not At Risk (01/05/2022)    Humiliation, Afraid, Rape, and Kick questionnaire     Fear of Current or Ex-Partner: No     Emotionally Abused: No     Physically Abused: No     Sexually Abused: No   Housing Stability: Low Risk  (01/05/2022)    Housing Stability Vital Sign     Unable to Pay for Housing in the Last Year: No     Number of Places Lived in the Last  Year: 1     Unstable Housing in the Last Year: No     Social History     Tobacco Use   Smoking Status Former    Packs/day: 1    Types: Cigarettes, Cigars    Quit date: 11/13/2021    Years since quitting: 0.6   Smokeless Tobacco Never        Temp (24hrs), Avg:98.3 F (36.8 C), Min:97.7 F (36.5 C), Max:98.7 F (37.1 C)    BP (!) 159/71   Pulse 73   Temp 98.5 F (36.9 C) (Oral)   Resp 18   Ht 1.499 m (4' 11.02")   Wt 81.1 kg (178 lb 12.7 oz)   SpO2 95%   BMI 36.09 kg/m     ROS: 12 point ROS obtained in details. Pertinent positives as mentioned in HPI,   otherwise negative    Physical Exam:    General: NAD, appears stated age, alert, comfortable    Eyes: PERRL, sclera is non-icteric  HENT: normocephalic/atraumatic, moist mucus membranes  Respiratory: CTA with no signs of respiratory distress  Cardiovascular: S1S2 regular, no cyanosis + 1-2 BLE ankle edema  GI: soft, non-tender, normal bowel sounds  Neuro: moves all extremities, no focal deficits, normal speech  Psych: appropriate mood and affect, no visual or auditory hallucinations  Skin: stage 4 sacral ulcer with red granulation tissue- no purulence, fluctuant tender swelling proximal to sacral ulcer       Labs: Results:   Chemistry No results for input(s): "GLUCOSE", "NA", "K", "CL", "CO2", "BUN", "CREATININE", "CA", "AGAP", "TP", "ALB", "GLOB", "ALT", "AST" in the last 72 hours.    Invalid input(s): "BUCR",  "TBIL", "GPT", "AP", "AGRAT"     CBC w/Diff No results for input(s): "WBC", "RBC", "HGB", "HCT", "PLT" in the last 72 hours.    Invalid input(s): "GRANS", "LYMPH", "EOS"     Microbiology Results       Procedure Component Value Units Date/Time    Culture, Blood 2 [[id number]] Collected: 06/28/22 1603    Order Status: Completed Specimen: Blood Updated: 07/02/22 0631     Special Requests NO SPECIAL REQUESTS        Culture NO GROWTH 4 DAYS       Culture, Blood 1 [[id number]] Collected: 06/28/22 1558    Order Status: Completed Specimen: Blood Updated: 07/02/22 0631     Special Requests NO SPECIAL REQUESTS        Culture NO GROWTH 4 DAYS       Culture, Anaerobic [[id number]]  (Abnormal) Collected: 06/24/22 1647    Order Status: Completed Specimen: Tissue Updated: 06/26/22 1207     Special Requests SACRAL DECUBITUS     Culture       MODERATE BACTEROIDES THETAIOTAOMICRON BETA LACTAMASE POSITIVE                  MODERATE Bacteroides fragilis BETA LACTAMASE POSITIVE          Culture, Tissue [[id number]]  (Abnormal)  (Susceptibility) Collected: 06/24/22 1647    Order Status: Completed Specimen: Tissue Updated: 06/28/22 0820     Special Requests SACRAL DECUBITUS     Gram stain OCCASIONAL WBCS SEEN         NO ORGANISMS SEEN        Culture       RARE * Methicillin Resistant Staphylococcus aureus *                  RARE STAPHYLOCOCCUS SPECIES, COAGULASE NEGATIVE            (  NOTE) PRELIMINARY REPORT OF MRSA CALLED TO L RIDDICK RN AT 1610 ON 11/24. CH.    Susceptibility        Methicillin-Resistant Staphylococcus aureus      BACTERIAL SUSCEPTIBILITY PANEL MIC      ciprofloxacin >=8 ug/mL Resistant      clindamycin 0.25 ug/mL Resistant      DAPTOmycin 0.25 ug/mL Sensitive      doxycycline 8 ug/mL Intermediate      erythromycin >=8 ug/mL Resistant      gentamicin <=0.5 ug/mL Sensitive      levofloxacin >=8 ug/mL Resistant      linezolid 4 ug/mL Sensitive      oxacillin >=4 ug/mL Resistant      tetracycline >=16 ug/mL Resistant       trimethoprim-sulfamethoxazole <=10 ug/mL Sensitive      vancomycin 1 ug/mL Sensitive                           Culture, Blood 1 [[id number]] Collected: 06/22/22 0433    Order Status: Completed Specimen: Blood Updated: 06/28/22 0608     Special Requests NO SPECIAL REQUESTS        Culture NO GROWTH 6 DAYS       Culture, Blood 2 [[id number]] Collected: 06/22/22 0433    Order Status: Completed Specimen: Blood Updated: 06/28/22 0608     Special Requests NO SPECIAL REQUESTS        Culture NO GROWTH 6 DAYS       Culture, Wound Aerobic Only [[id number]] Collected: 06/20/22 2230    Order Status: Canceled Specimen: Sacrum     Blood Culture 2 [[id number]] Collected: 06/20/22 1820    Order Status: Completed Specimen: Blood Updated: 06/26/22 0707     Special Requests NO SPECIAL REQUESTS        Culture NO GROWTH 6 DAYS       Respiratory Panel, Molecular, with COVID-19 (Restricted: peds pts or suitable admitted adults) [[id number]] Collected: 06/20/22 1820    Order Status: Completed Specimen: Nasopharyngeal Updated: 06/20/22 1926     Adenovirus by PCR Not detected        Coronavirus 229E by PCR Not detected        Coronavirus HKU1 by PCR Not detected        Coronavirus NL63 by PCR Not detected        Coronavirus OC43 by PCR Not detected        SARS-CoV-2, PCR Not detected        Human Metapneumovirus by PCR Not detected        Rhinovirus Enterovirus PCR Not detected        Influenza A by PCR Not detected        Influenza B PCR Not detected        Parainfluenza 1 PCR Not detected        Parainfluenza 2 PCR Not detected        Parainfluenza 3 PCR Not detected        Parainfluenza 4 PCR Not detected        Respiratory Syncytial Virus by PCR Not detected        Bordetella parapertussis by PCR Not detected        Bordetella pertussis by PCR Not detected        Chlamydophila Pneumonia PCR Not detected        Mycoplasma pneumo by PCR Not detected       Culture, Blood, PCR ID  Panel [[id number]]  (Abnormal) Collected: 06/20/22 1820     Order Status: Completed Specimen: Blood Updated: 06/21/22 1848     Accession Number [id number]     Enterococcus faecalis by PCR Not detected        Enterococcus faecium by PCR Not detected        Listeria monocytogenes by PCR Not detected        STAPHYLOCOCCUS Not detected        Staphylococcus Aureus Not detected        Staphylococcus epidermidis by PCR Not detected        Staphylococcus lugdunensis by PCR Not detected        STREPTOCOCCUS Not detected        Streptococcus agalactiae (Group B) Not detected        Strep pneumoniae Not detected        Strep pyogenes,(Grp. A) Not detected        Acinetobacter calcoac baumannii complex by PCR Not detected        Bacteroides fragilis by PCR Detected        Enterobacteriaceae by PCR Not detected        Enterobacter cloacae complex by PCR Not detected        Escherichia Coli Not detected        Klebsiella aerogenes by PCR Not detected        Klebsiella oxytoca by PCR Not detected        Klebsiella pneumoniae group by PCR Not detected        Proteus by PCR Not detected        Salmonella species by PCR Not detected        Serratia marcescens by PCR Not detected        Haemophilus Influenzae by PCR Not detected        Neisseria meningitidis by PCR Not detected        Pseudomonas aeruginosa Not detected        Stenotrophomonas maltophilia by PCR Not detected        Candida albicans by PCR Not detected        Candida auris by PCR Not detected        Candida glabrata Not detected        Candida krusei by PCR Not detected        Candida parapsilosis by PCR Not detected        Candida tropicalis by PCR Not detected        Cryptococcus neoformans/gattii by PCR Not detected        Resistant gene targets          [employer] test comment       False positive results may rarely occur. Correlate with clinical,epidemiologic, and other laboratory findings           Comment: Please see BCID Interpretation Guide in EPIC Links       Blood Culture 1 [[id number]]  (Abnormal) Collected: 06/20/22  1800    Order Status: Completed Specimen: Blood Updated: 06/23/22 1031     Special Requests NO SPECIAL REQUESTS        Gram stain       ANAEROBIC BOTTLE Gram negative rods                  SMEAR CALLED TO AND CORRECTLY REPEATED BY: RN W BARKER SVTSD ON [id number] AT 1836 HRS TO 1396           Culture  Bacteroides fragilis BETA LACTAMASE POSITIVE GROWING IN 1 OF 2 BOTTLES DRAWN SITE=RFA                      RADIOLOGY:    All available imaging studies/reports in connect care for this admission were reviewed      Disclaimer: Sections of this note are dictated utilizing voice recognition software, which may have resulted in some phonetic based errors in grammar and contents. Even though attempts were made to correct all the mistakes, some may have been missed, and remained in the body of the document. If questions arise, please contact our department.    Dr. Raiford Simmonds, Infectious Disease Specialist  [phone]  July 02, 2022  3:10 PM

## 2022-07-03 LAB — POCT GLUCOSE
POC Glucose: 126 mg/dL — ABNORMAL HIGH (ref 70–110)
POC Glucose: 119 mg/dL — ABNORMAL HIGH (ref 70–110)
POC Glucose: 134 mg/dL — ABNORMAL HIGH (ref 70–110)
POC Glucose: 123 mg/dL — ABNORMAL HIGH (ref 70–110)

## 2022-07-03 MED FILL — INSULIN LISPRO 100 UNIT/ML IJ SOLN: 100 UNIT/ML | INTRAMUSCULAR | Qty: 7

## 2022-07-03 MED FILL — CARVEDILOL 6.25 MG PO TABS: 6.25 MG | ORAL | Qty: 1

## 2022-07-03 MED FILL — INSULIN GLARGINE 100 UNIT/ML SC SOLN: 100 UNIT/ML | SUBCUTANEOUS | Qty: 30

## 2022-07-03 MED FILL — PIPERACILLIN SOD-TAZOBACTAM SO 3.375 (3-0.375) G IV SOLR: 3.375 (3-0.375) g | INTRAVENOUS | Qty: 3375

## 2022-07-03 MED FILL — NORVASC 5 MG PO TABS: 5 MG | ORAL | Qty: 1

## 2022-07-03 MED FILL — HEPARIN SODIUM (PORCINE) 5000 UNIT/ML IJ SOLN: 5000 UNIT/ML | INTRAMUSCULAR | Qty: 1

## 2022-07-03 MED FILL — FAMOTIDINE 20 MG PO TABS: 20 MG | ORAL | Qty: 1

## 2022-07-03 MED FILL — BACLOFEN 10 MG PO TABS: 10 MG | ORAL | Qty: 1

## 2022-07-03 MED FILL — ATORVASTATIN CALCIUM 40 MG PO TABS: 40 MG | ORAL | Qty: 2

## 2022-07-03 MED FILL — CULTURELLE PO CAPS: ORAL | Qty: 1

## 2022-07-03 MED FILL — ASPIRIN ADULT 325 MG PO TABS: 325 MG | ORAL | Qty: 1

## 2022-07-03 MED FILL — VANCOMYCIN HCL 750 MG IV SOLR: 750 MG | INTRAVENOUS | Qty: 0

## 2022-07-03 MED FILL — ACETAMINOPHEN 325 MG PO TABS: 325 MG | ORAL | Qty: 2

## 2022-07-03 MED FILL — FERROUS SULFATE 325 (65 FE) MG PO TABS: 325 (65 Fe) MG | ORAL | Qty: 1

## 2022-07-03 NOTE — Plan of Care (Signed)
Problem: Discharge Planning  Goal: Discharge to home or other facility with appropriate resources  07/03/2022 1145 by Vivianne Spence, RN  Outcome: Progressing  Flowsheets (Taken 07/03/2022 [phone])  Discharge to home or other facility with appropriate resources:   Identify barriers to discharge with patient and caregiver   Arrange for needed discharge resources and transportation as appropriate  07/03/2022 0105 by Claudie Revering, RN  Outcome: Progressing     Problem: Pain  Goal: Verbalizes/displays adequate comfort level or baseline comfort level  07/03/2022 1145 by Vivianne Spence, RN  Outcome: Progressing  07/03/2022 0105 by Claudie Revering, RN  Outcome: Progressing     Problem: Skin/Tissue Integrity  Goal: Absence of new skin breakdown  Description: 1.  Monitor for areas of redness and/or skin breakdown  2.  Assess vascular access sites hourly  3.  Every 4-6 hours minimum:  Change oxygen saturation probe site  4.  Every 4-6 hours:  If on nasal continuous positive airway pressure, respiratory therapy assess nares and determine need for appliance change or resting period.  07/03/2022 1145 by Vivianne Spence, RN  Outcome: Progressing  07/03/2022 0105 by Claudie Revering, RN  Outcome: Progressing     Problem: Safety - Adult  Goal: Free from fall injury  07/03/2022 1145 by Vivianne Spence, RN  Outcome: Progressing  07/03/2022 0105 by Claudie Revering, RN  Outcome: Progressing     Problem: ABCDS Injury Assessment  Goal: Absence of physical injury  07/03/2022 1145 by Vivianne Spence, RN  Outcome: Progressing  Flowsheets (Taken 07/01/2022 0915 by Morley Kos, RN)  Absence of Physical Injury: Implement safety measures based on patient assessment  07/03/2022 0105 by Claudie Revering, RN  Outcome: Progressing     Problem: Chronic Conditions and Co-morbidities  Goal: Patient's chronic conditions and co-morbidity symptoms are monitored and maintained or improved  Recent Flowsheet Documentation  Taken  07/03/2022 0916 by Vivianne Spence, [place] - Patient's Chronic Conditions and Co-Morbidity Symptoms are Monitored and Maintained or Improved:   Monitor and assess patient's chronic conditions and comorbid symptoms for stability, deterioration, or improvement   Collaborate with multidisciplinary team to address chronic and comorbid conditions and prevent exacerbation or deterioration  07/03/2022 0105 by Claudie Revering, RN  Outcome: Progressing  Flowsheets (Taken 07/02/2022 2145)  Care Plan - Patient's Chronic Conditions and Co-Morbidity Symptoms are Monitored and Maintained or Improved:   Monitor and assess patient's chronic conditions and comorbid symptoms for stability, deterioration, or improvement   Collaborate with multidisciplinary team to address chronic and comorbid conditions and prevent exacerbation or deterioration   Update acute care plan with appropriate goals if chronic or comorbid symptoms are exacerbated and prevent overall improvement and discharge     Problem: Nutrition Deficit:  Goal: Optimize nutritional status  07/03/2022 0105 by Claudie Revering, RN  Outcome: Progressing

## 2022-07-03 NOTE — Progress Notes (Signed)
Pt using Home unit

## 2022-07-03 NOTE — Plan of Care (Signed)
Problem: Discharge Planning  Goal: Discharge to home or other facility with appropriate resources  Outcome: Progressing     Problem: Pain  Goal: Verbalizes/displays adequate comfort level or baseline comfort level  Outcome: Progressing     Problem: Skin/Tissue Integrity  Goal: Absence of new skin breakdown  Description: 1.  Monitor for areas of redness and/or skin breakdown  2.  Assess vascular access sites hourly  3.  Every 4-6 hours minimum:  Change oxygen saturation probe site  4.  Every 4-6 hours:  If on nasal continuous positive airway pressure, respiratory therapy assess nares and determine need for appliance change or resting period.  Outcome: Progressing     Problem: Safety - Adult  Goal: Free from fall injury  Outcome: Progressing     Problem: ABCDS Injury Assessment  Goal: Absence of physical injury  Outcome: Progressing     Problem: Chronic Conditions and Co-morbidities  Goal: Patient's chronic conditions and co-morbidity symptoms are monitored and maintained or improved  Outcome: Progressing  Flowsheets (Taken 07/02/2022 2145)  Care Plan - Patient's Chronic Conditions and Co-Morbidity Symptoms are Monitored and Maintained or Improved:   Monitor and assess patient's chronic conditions and comorbid symptoms for stability, deterioration, or improvement   Collaborate with multidisciplinary team to address chronic and comorbid conditions and prevent exacerbation or deterioration   Update acute care plan with appropriate goals if chronic or comorbid symptoms are exacerbated and prevent overall improvement and discharge     Problem: Nutrition Deficit:  Goal: Optimize nutritional status  Outcome: Progressing

## 2022-07-03 NOTE — Progress Notes (Signed)
Hospitalist Progress Note    NAME:  [name] Short   DOB:   [birth year]   MRN:   [id number]     Date/Time:  07/03/2022  Subjective:   Chief Complaint:    Pt has no cp no sob; NAD, stable, no complaints       Past Med History and Social history reviewed. No changes.   [x]   Current Medication list and allergies reviewed*    Objective:   Vitals:  BP 136/63   Pulse 70   Temp 98.5 F (36.9 C) (Oral)   Resp 18   Ht 1.499 m (4' 11.02")   Wt 81.1 kg (178 lb 12.7 oz)   SpO2 98%   BMI 36.09 kg/m   Temp (24hrs), Avg:98.3 F (36.8 C), Min:98.1 F (36.7 C), Max:98.5 F (36.9 C)      Last 24hr Input/Output:    Intake/Output Summary (Last 24 hours) at 07/03/2022 2005  Last data filed at 07/03/2022 1432  Gross per 24 hour   Intake 240 ml   Output 2100 ml   Net -1860 ml        PHYSICAL EXAM:  Gen:  []   WD [x]   WN  []   cachectic  []   thin  []   obese  []   disheveled             []    Critically ill or  []   Chronically ill appearing    HEENT:   []    red/pink conjunctivae []   pale conjunctivae                  PERRL  []   yes  []   no        hearing intact to voice []   yes  []   No               [x]   moist or  []   dry  Mucosa  NECK:   supple []   yes  []   no      masses []   yes  []   No               thyroid    []   non tender []   tender    RESP:   use of accessory muscles []   yes [x]   no                BS    [x]   clear  []   wheezing []   rhonchi []   crackles   CARD:  murmur  []   yes [x]   no   []   thrill  []   carotid bruits                 LE edema []   yes  []   no    []   JVD present    ABD:    tenderness []   yes [x]   no   Liver/spleen enlargement []    yes []    no                distended []    yes [x]   no    bowel sound []   normal []   hypo or  []   hyperactive    MSKL:   cyanosis/clubbing []   yes []   no         []    Spastic []   Flaccid []   Cog wheeling      NEUR:   []   cranial nerves intact       []   L []   R weakness    []    follows commands     []    aphasic    [x]   alert  []   lethargic  []   stuporous  []   sedated             Alert and  Oriented  []   time  []   place   []   person  PSYCH:   insight []   poor []   good     mood []   depressed []   anxious []   agitated                   []   Telemetry Reviewed     []   NSR []   PAC/PVCs   []   Afib  []   Paced   []   NSVT   []   Foley []   NGT  []   Intubated on vent    Lab Data Reviewed:  No components found for: "GLPOC"  No results for input(s): "NA", "K", "CL", "CO2", "BUN", "CREA", "GLU", "PHOS", "CA", "ALB", "WBC", "HGB", "HCT", "PLT" in the last 72 hours.       []     I have personally reviewed the   []   xray  []   CT scan    Assessment/Plan:     Principal Problem (Resolved):    Severe sepsis (HCC)  Active Problems:    Obstructive sleep apnea syndrome    Diabetes (HCC)    Decubitus ulcer of sacral region, stage 4 (HCC)    Hypertension    History of CVA (cerebrovascular accident)    Hyperlipidemia    Sacral osteomyelitis (HCC)     ___________________________________________________  PLAN:  Risk of deterioration:  []   Low    []   Moderate  []   High       Patient admitted from home on 11/17 secondary to infection of stage IV decubitus sacral ulcer. She was receiving wound care at home but noticed increased pain and drainage. Patient underwent I&D and debridement with Dr. (GS) on 11/21. Intra-op cultures were obtained. Dr. (ID) following for antibiotic recs.     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 -->resolved. Remains on vanc and zosyn. ID recommends outpt IV abx, s/p PICC line.    Has wound Vacc on;    Bacteroides bacteremia, wound infection -    #1. Surgery (Drs. , ) following. Recommendation made for wound vac to sacrum, wound vac placed.      Continue vanc, Zosyn.thru 08/07/22; s/p Picc line;     Wound care consulted. Blood cultures redrawn on 11/19, NGTD. intra-op cultures-->MRSA (prelim/antigen detection).     Repeat b/c x 2; so fare negative;    DMT2, insulin dependent with hyperglycemia - SSI + Lantus      Hypertension, essential - with high BP; add coreg; and on Norvasc;    History of CVA - on ASA, statin     Hyperlipidemia - continue statin.     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 with external bone growth stimulator placement on Dec 09, 2021     Sleep apnea noncompliant with CPAP    __________________________________________________    Total time spent with patient:     minutes    []   Critical Care time:      minutes    Care Plan discussed with:    [x]   Patient   []   Family    []   Care  Manager  [x]   Nursing   []   Consultant/Specialist :      []     >50% of visit spent in counseling and coordination of care   (Discussed []   CODE status,  []   Care Plan, []   D/C Planning)    Prophylaxis:  []   Lovenox  []   Coumadin  [x]   Hep SQ  []   SCD's  []   H2B/PPI    Disposition:  []   Home w/ Family   []   HH PT,OT,RN   [x]   SNF/LTC   []   SAH/Rehab  ___________________________________________________    Hospitalist: [name] BoJaime L Isyss Espinal, MD     ___________________________________________________    *Medications reviewed:  Current Facility-Administered Medications   Medication Dose Route Frequency    vancomycin (VANCOCIN) 1500 mg in sodium chloride 0.9% 500 mL IVPB  1,500 mg IntraVENous Q24H    carvedilol (COREG) tablet 6.25 mg  6.25 mg Oral BID WC    insulin glargine (LANTUS) injection vial 30 Units  30 Units SubCUTAneous Nightly    insulin lispro (HUMALOG) injection vial 7 Units  7 Units SubCUTAneous TID WC    insulin lispro (HUMALOG) injection vial 0-15 Units  0-15 Units SubCUTAneous 4x Daily AC & HS    amLODIPine (NORVASC) tablet 5 mg  5 mg Oral Daily    0.9 % sodium chloride infusion   IntraVENous PRN    lactobacillus (CULTURELLE) capsule 1 capsule  1 capsule Oral Daily with breakfast    sodium chloride flush 0.9 % injection 5-40 mL  5-40 mL IntraVENous 2 times per day    sodium chloride flush 0.9 % injection 5-40 mL  5-40 mL IntraVENous PRN    potassium chloride (KLOR-CON M) extended  release tablet 40 mEq  40 mEq Oral PRN    Or    potassium bicarb-citric acid (EFFER-K) effervescent tablet 40 mEq  40 mEq Oral PRN    Or    potassium chloride 10 mEq/100 mL IVPB (Peripheral Line)  10 mEq IntraVENous PRN    magnesium sulfate 2000 mg in 50 mL IVPB premix  2,000 mg IntraVENous PRN    ondansetron (ZOFRAN-ODT) disintegrating tablet 4 mg  4 mg Oral Q8H PRN    Or    ondansetron (ZOFRAN) injection 4 mg  4 mg IntraVENous Q6H PRN    polyethylene glycol (GLYCOLAX) packet 17 g  17 g Oral Daily PRN    famotidine (PEPCID) tablet 20 mg  20 mg Oral Daily    acetaminophen (TYLENOL) tablet 650 mg  650 mg Oral Q6H PRN    Or    acetaminophen (TYLENOL) suppository 650 mg  650 mg Rectal Q6H PRN    glucose chewable tablet 16 g  4 tablet Oral PRN    dextrose bolus 10% 125 mL  125 mL IntraVENous PRN    Or    dextrose bolus 10% 250 mL  250 mL IntraVENous PRN    glucagon injection 1 mg  1 mg SubCUTAneous PRN    dextrose 10 % infusion   IntraVENous Continuous PRN    piperacillin-tazobactam (ZOSYN) 3,375 mg in sodium chloride 0.9 % 50 mL IVPB (mini-bag)  3,375 mg IntraVENous Q8H    heparin (porcine) injection 5,000 Units  5,000 Units SubCUTAneous 3 times per day    baclofen (LIORESAL) tablet 10 mg  10 mg Oral BID    aspirin tablet 325 mg  325 mg Oral Daily    atorvastatin (LIPITOR) tablet 80 mg  80 mg Oral Nightly    ferrous sulfate (  IRON 325) tablet 325 mg  325 mg Oral Daily

## 2022-07-03 NOTE — Care Coordination-Inpatient (Signed)
Spoke with [name] Short at [facility]. Auth approved. Wound vac should arrive to facility tomorrow.   Request noon transport.     Tanyika Barros, MSW  Care Management

## 2022-07-04 ENCOUNTER — Encounter: Payer: PRIVATE HEALTH INSURANCE | Primary: Physician Assistant

## 2022-07-04 LAB — POCT GLUCOSE
POC Glucose: 129 mg/dL — ABNORMAL HIGH (ref 70–110)
POC Glucose: 123 mg/dL — ABNORMAL HIGH (ref 70–110)
POC Glucose: 125 mg/dL — ABNORMAL HIGH (ref 70–110)

## 2022-07-04 LAB — BASIC METABOLIC PANEL
Anion Gap: 2 mmol/L — ABNORMAL LOW (ref 3.0–18)
BUN/Creatinine Ratio: 26 — ABNORMAL HIGH (ref 12–20)
BUN: 21 mg/dL — ABNORMAL HIGH (ref 7.0–18)
CO2: 27 mmol/L (ref 21–32)
Calcium: 10.2 mg/dL — ABNORMAL HIGH (ref 8.5–10.1)
Chloride: 114 mmol/L — ABNORMAL HIGH (ref 100–111)
Creatinine: 0.81 mg/dL (ref 0.6–1.3)
Est, Glom Filt Rate: >60 mL/min/{1.73_m2} (ref >60)
Glucose: 132 mg/dL — ABNORMAL HIGH (ref 74–99)
Potassium: 3.6 mmol/L (ref 3.5–5.5)
Sodium: 143 mmol/L (ref 136–145)

## 2022-07-04 LAB — CULTURE, BLOOD 1: Culture: NO GROWTH

## 2022-07-04 LAB — CULTURE, BLOOD 2: Culture: NO GROWTH

## 2022-07-04 MED FILL — FAMOTIDINE 20 MG PO TABS: 20 MG | ORAL | Qty: 1

## 2022-07-04 MED FILL — INSULIN LISPRO 100 UNIT/ML IJ SOLN: 100 UNIT/ML | INTRAMUSCULAR | Qty: 7

## 2022-07-04 MED FILL — FERROUS SULFATE 325 (65 FE) MG PO TABS: 325 (65 Fe) MG | ORAL | Qty: 1

## 2022-07-04 MED FILL — ATORVASTATIN CALCIUM 40 MG PO TABS: 40 MG | ORAL | Qty: 2

## 2022-07-04 MED FILL — SODIUM CHLORIDE 0.9 % IV SOLN: 0.9 % | INTRAVENOUS | Qty: 50

## 2022-07-04 MED FILL — PIPERACILLIN SOD-TAZOBACTAM SO 3.375 (3-0.375) G IV SOLR: 3.375 (3-0.375) g | INTRAVENOUS | Qty: 3375

## 2022-07-04 MED FILL — CULTURELLE PO CAPS: ORAL | Qty: 1

## 2022-07-04 MED FILL — INSULIN GLARGINE 100 UNIT/ML SC SOLN: 100 UNIT/ML | SUBCUTANEOUS | Qty: 30

## 2022-07-04 MED FILL — HEPARIN SODIUM (PORCINE) 5000 UNIT/ML IJ SOLN: 5000 UNIT/ML | INTRAMUSCULAR | Qty: 1

## 2022-07-04 MED FILL — VANCOMYCIN 1500 MG NS 500 ML (PREMIX) IVPB: Qty: 500

## 2022-07-04 MED FILL — CARVEDILOL 6.25 MG PO TABS: 6.25 MG | ORAL | Qty: 1

## 2022-07-04 MED FILL — BACLOFEN 10 MG PO TABS: 10 MG | ORAL | Qty: 1

## 2022-07-04 MED FILL — NORVASC 5 MG PO TABS: 5 MG | ORAL | Qty: 1

## 2022-07-04 MED FILL — ASPIRIN ADULT 325 MG PO TABS: 325 MG | ORAL | Qty: 1

## 2022-07-04 NOTE — Plan of Care (Signed)
Problem: Discharge Planning  Goal: Discharge to home or other facility with appropriate resources  07/04/2022 1119 by Lillia Dallas, RN  Outcome: Progressing  Flowsheets (Taken 07/04/2022 0930)  Discharge to home or other facility with appropriate resources:   Identify barriers to discharge with patient and caregiver   Arrange for needed discharge resources and transportation as appropriate   Identify discharge learning needs (meds, wound care, etc)  07/04/2022 0145 by Claudie Revering, RN  Outcome: Progressing  Flowsheets (Taken 07/03/2022 2017)  Discharge to home or other facility with appropriate resources:   Identify barriers to discharge with patient and caregiver   Arrange for needed discharge resources and transportation as appropriate     Problem: Pain  Goal: Verbalizes/displays adequate comfort level or baseline comfort level  07/04/2022 1119 by Lillia Dallas, RN  Outcome: Progressing  07/04/2022 0145 by Claudie Revering, RN  Outcome: Progressing     Problem: Skin/Tissue Integrity  Goal: Absence of new skin breakdown  Description: 1.  Monitor for areas of redness and/or skin breakdown  2.  Assess vascular access sites hourly  3.  Every 4-6 hours minimum:  Change oxygen saturation probe site  4.  Every 4-6 hours:  If on nasal continuous positive airway pressure, respiratory therapy assess nares and determine need for appliance change or resting period.  07/04/2022 1119 by Lillia Dallas, RN  Outcome: Progressing  07/04/2022 0145 by Claudie Revering, RN  Outcome: Progressing     Problem: Safety - Adult  Goal: Free from fall injury  07/04/2022 1119 by Lillia Dallas, RN  Outcome: Progressing  Flowsheets (Taken 07/04/2022 1117)  Free From Fall Injury:   Instruct family/caregiver on patient safety   Based on caregiver fall risk screen, instruct family/caregiver to ask for assistance with transferring infant if caregiver noted to have fall risk factors  07/04/2022 0145 by Claudie Revering,  RN  Outcome: Progressing     Problem: ABCDS Injury Assessment  Goal: Absence of physical injury  07/04/2022 1119 by Lillia Dallas, RN  Outcome: Progressing  Flowsheets (Taken 07/04/2022 1117)  Absence of Physical Injury: Implement safety measures based on patient assessment  07/04/2022 0145 by Claudie Revering, RN  Outcome: Progressing     Problem: Chronic Conditions and Co-morbidities  Goal: Patient's chronic conditions and co-morbidity symptoms are monitored and maintained or improved  07/04/2022 1119 by Lillia Dallas, RN  Outcome: Progressing  Flowsheets (Taken 07/04/2022 0930)  Care Plan - Patient's Chronic Conditions and Co-Morbidity Symptoms are Monitored and Maintained or Improved:   Monitor and assess patient's chronic conditions and comorbid symptoms for stability, deterioration, or improvement   Collaborate with multidisciplinary team to address chronic and comorbid conditions and prevent exacerbation or deterioration   Update acute care plan with appropriate goals if chronic or comorbid symptoms are exacerbated and prevent overall improvement and discharge  07/04/2022 0145 by Claudie Revering, RN  Outcome: Progressing  [place] (Taken 07/03/2022 2017)  Care Plan - Patient's Chronic Conditions and Co-Morbidity Symptoms are Monitored and Maintained or Improved:   Monitor and assess patient's chronic conditions and comorbid symptoms for stability, deterioration, or improvement   Collaborate with multidisciplinary team to address chronic and comorbid conditions and prevent exacerbation or deterioration   Update acute care plan with appropriate goals if chronic or comorbid symptoms are exacerbated and prevent overall improvement and discharge     Problem: Nutrition Deficit:  Goal: Optimize nutritional status  07/04/2022 1119 by Lillia Dallas, RN  Outcome: Progressing  07/04/2022 0145  by Shaune Leeks, RN  Outcome: Progressing

## 2022-07-04 NOTE — Care Coordination-Inpatient (Addendum)
CASE MANAGEMENT DISCHARGE NOTE        DISCHARGE DISPOSITION INFORMATION:    DISPOSITION NAME/TYPE - [facility]     Please take patient home CPAP.    ADDRESS - [address] [place] [address]    ROOM#    REPORT# [phone]      Bedside RN notified of discharge disposition.  Nursing Please include all hard scripts for controlled substances, med rec and dc summary, and AVS in packet.      Spoke with Zandra in admissions to confirm wound vac and IV abx are accommodated at facility.     Perfectserved Dr. Posey Pronto and Dr. Daisy Lazar to update discharge summary and include IV antibiotics.       Harrington Jobe, MSW  Care Management

## 2022-07-04 NOTE — Progress Notes (Signed)
Infectious Disease progress Note        Reason:sepsis, sacral abscess/osteomyelitis, gram negative bloodstream infection    Current abx Prior abx   Zosyn, vancomycin since 06/20/22      Lines:       Assessment :  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  Status post I&D on 06/24/2022.  Intraoperative findings noted.  Necrotic soft tissue/slough noted in the sacrum with purulent fluid.  Pus pocket extended superiorly and laterally to the larger pocket.    IntraOp cultures 11/21-MRSA     Cervical spondylotic myelopathy: s/p  C3, C4, C5, C6 laminectomy; posterior cervical fusion C3, C4, C5, C6; segmental instrumentation, [employer] type, C3, C4, C5, C6 with lateral mass screws on 12/09/21      right hip pain: ? Bursitis ? Blunt trauma. No signs of infection noted on today's exam    Recommendations:     Continue zosyn, vancomycin till 08/07/22  Follow up surgery recommendations regarding sacral wound care  [facility] pharmacy to adjust antibiotic dosing, monitor vancomycin levels.  Remove PICC line after completion of antibiotics           Above plan was discussed in details with patient, case manager, dr. Buena Irish.  Please call me if any further questions or concerns. Will continue to participate in  the care of this patient.    HPI:    No new complaints.  Awaiting wound VAC arrangement at nursing home      Past Medical History:   Diagnosis Date    Anemia     Arthritis     Chronic pain     Legs and Shoulders    Diabetes (HCC)     Exposure to asbestos     History of blood transfusion     History of colon polyps     HTN (hypertension)     Hx of blood clots     DVT    Hyperlipemia     Menopause     Pulmonary nodule     Serum calcium elevated     Sleep apnea     not using cpap    Stroke [facility])        Past Surgical History:   Procedure Laterality Date    BACK SURGERY N/A 06/24/2022    INCISION AND DRAINAGE /DEBRIDEMENT SACRAL DECUBITUS performed by Phineas Semen, DO at [facility] MAIN OR    CESAREAN SECTION  1987    COLONOSCOPY N/A 01/13/2017    COLONOSCOPY performed by Levan Hurst, MD at [facility] ENDOSCOPY    COLONOSCOPY      DILATION AND CURETTAGE OF UTERUS  1995    RECTAL SURGERY N/A 12/28/2021    SACRAL WOUND DEBRIDEMENT INCISION AND DRAINAGE performed by Phineas Semen, DO at [facility] MAIN OR  SPINE SURGERY N/A 12/09/2021    CERVICAL THREE/FOUR/FIVE/SIX LAMINECTOMY FUSION; C-ARM; STRYKER; EXT BONE GROWTH STIM; 23 HR performed by Pamalee LeydenMark B Kerner, MD at [facility] MAIN OR       @[email or web address] @    Current Facility-Administered Medications   Medication Dose Route Frequency    vancomycin (VANCOCIN) 1500 mg in sodium chloride 0.9% 500 mL IVPB  1,500 mg IntraVENous Q24H    carvedilol (COREG) tablet 6.25 mg  6.25 mg Oral BID WC    insulin glargine (LANTUS) injection vial 30 Units  30 Units SubCUTAneous Nightly    insulin lispro (HUMALOG) injection vial 7 Units  7 Units SubCUTAneous TID WC    insulin lispro (HUMALOG) injection vial 0-15 Units  0-15 Units SubCUTAneous 4x Daily AC & HS    amLODIPine (NORVASC) tablet 5 mg  5 mg Oral Daily    lactobacillus (CULTURELLE) capsule 1 capsule  1 capsule Oral Daily with breakfast    sodium chloride flush 0.9 % injection 5-40 mL  5-40 mL IntraVENous 2 times per day    sodium chloride flush 0.9 %  injection 5-40 mL  5-40 mL IntraVENous PRN    potassium chloride (KLOR-CON M) extended release tablet 40 mEq  40 mEq Oral PRN    Or    potassium bicarb-citric acid (EFFER-K) effervescent tablet 40 mEq  40 mEq Oral PRN    Or    potassium chloride 10 mEq/100 mL IVPB (Peripheral Line)  10 mEq IntraVENous PRN    magnesium sulfate 2000 mg in 50 mL IVPB premix  2,000 mg IntraVENous PRN    ondansetron (ZOFRAN-ODT) disintegrating tablet 4 mg  4 mg Oral Q8H PRN    Or    ondansetron (ZOFRAN) injection 4 mg  4 mg IntraVENous Q6H PRN    polyethylene glycol (GLYCOLAX) packet 17 g  17 g Oral Daily PRN    famotidine (PEPCID) tablet 20 mg  20 mg Oral Daily    acetaminophen (TYLENOL) tablet 650 mg  650 mg Oral Q6H PRN    Or    acetaminophen (TYLENOL) suppository 650 mg  650 mg Rectal Q6H PRN    glucose chewable tablet 16 g  4 tablet Oral PRN    dextrose bolus 10% 125 mL  125 mL IntraVENous PRN    Or    dextrose bolus 10% 250 mL  250 mL IntraVENous PRN    glucagon injection 1 mg  1 mg SubCUTAneous PRN    dextrose 10 % infusion   IntraVENous Continuous PRN    piperacillin-tazobactam (ZOSYN) 3,375 mg in sodium chloride 0.9 % 50 mL IVPB (mini-bag)  3,375 mg IntraVENous Q8H    heparin (porcine) injection 5,000 Units  5,000 Units SubCUTAneous 3 times per day    baclofen (LIORESAL) tablet 10 mg  10 mg Oral BID    aspirin tablet 325 mg  325 mg Oral Daily    atorvastatin (LIPITOR) tablet 80 mg  80 mg Oral Nightly    ferrous sulfate (IRON 325) tablet 325 mg  325 mg Oral Daily       Allergies: Lactose    Family History   Problem Relation Age of Onset    Diabetes Other 4245        parent, NOS    Cancer Father     Hypertension Other 35        parent,NOS    Heart Disease Other 9162        parent, NOS    Lung Disease Father  Social History     Socioeconomic History    Marital status: Single     Spouse name: Not on file    Number of children: Not on file    Years of education: Not on file    Highest education level: Not on file   Occupational  History    Not on file   Tobacco Use    Smoking status: Former     Packs/day: 1     Types: Cigarettes, Cigars     Quit date: 11/13/2021     Years since quitting: 0.6    Smokeless tobacco: Never   Vaping Use    Vaping Use: Never used   Substance and Sexual Activity    Alcohol use: Not Currently     Comment: occ    Drug use: Never    Sexual activity: Not on file   Other Topics Concern    Not on file   Social History Narrative    Not on file     Social Determinants of Health     Financial Resource Strain: Low Risk  (01/05/2022)    Overall Financial Resource Strain (CARDIA)     Difficulty of Paying Living Expenses: Not hard at all   Food Insecurity: Not on file (01/05/2022)   Transportation Needs: No Transportation Needs (02/09/2022)    OASIS A1250: Transportation     Lack of Transportation (Medical): No     Lack of Transportation (Non-Medical): No     Patient Unable or Declines to Respond: No   Physical Activity: Inactive (01/05/2022)    Exercise Vital Sign     Days of Exercise per Week: 0 days     Minutes of Exercise per Session: 0 min   Stress: No Stress Concern Present (01/05/2022)    [employer] of Occupational Health - Occupational Stress Questionnaire     Feeling of Stress : Not at all   Social Connections: Feeling Socially Integrated (02/09/2022)    OASIS D0700: Social Isolation     Frequency of experiencing loneliness or isolation: Never   Recent Concern: Social Connections - Moderately Isolated (01/05/2022)    Social Connection and Isolation Panel [NHANES]     Frequency of Communication with Friends and Family: Three times a week     Frequency of Social Gatherings with Friends and Family: More than three times a week     Attends Religious Services: More than 4 times per year     Active Member of Clubs or Organizations: No     Attends Banker Meetings: Never     Marital Status: Widowed   Intimate Partner Violence: Not At Risk (01/05/2022)    Humiliation, Afraid, Rape, and Kick questionnaire     Fear of  Current or Ex-Partner: No     Emotionally Abused: No     Physically Abused: No     Sexually Abused: No   Housing Stability: Low Risk  (01/05/2022)    Housing Stability Vital Sign     Unable to Pay for Housing in the Last Year: No     Number of Places Lived in the Last Year: 1     Unstable Housing in the Last Year: No     Social History     Tobacco Use   Smoking Status Former    Packs/day: 1    Types: Cigarettes, Cigars    Quit date: 11/13/2021    Years since quitting: 0.6   Smokeless Tobacco Never  Temp (24hrs), Avg:98.4 F (36.9 C), Min:97.9 F (36.6 C), Max:98.6 F (37 C)    BP (!) 152/69   Pulse 62   Temp 97.9 F (36.6 C) (Oral)   Resp 18   Ht 1.499 m (4' 11.02")   Wt 81.1 kg (178 lb 12.7 oz)   SpO2 94%   BMI 36.09 kg/m     ROS: 12 point ROS obtained in details. Pertinent positives as mentioned in HPI,   otherwise negative    Physical Exam:    General: NAD, appears stated age, alert, comfortable    Eyes: PERRL, sclera is non-icteric  HENT: normocephalic/atraumatic, moist mucus membranes  Respiratory: CTA with no signs of respiratory distress  Cardiovascular: S1S2 regular, no cyanosis + 1-2 BLE ankle edema  GI: soft, non-tender, normal bowel sounds  Neuro: moves all extremities, no focal deficits, normal speech  Psych: appropriate mood and affect, no visual or auditory hallucinations  Skin: stage 4 sacral ulcer with red granulation tissue- no purulence, fluctuant tender swelling proximal to sacral ulcer       Labs: Results:   Chemistry Recent Labs     07/04/22  0630   GLUCOSE 132*   NA 143   K 3.6   CL 114*   CO2 27   BUN 21*   CREATININE 0.81        CBC w/Diff No results for input(s): "WBC", "RBC", "HGB", "HCT", "PLT" in the last 72 hours.    Invalid input(s): "GRANS", "LYMPH", "EOS"     Microbiology Results       Procedure Component Value Units Date/Time    Culture, Blood 2 [[id number]] Collected: 06/28/22 1603    Order Status: Completed Specimen: Blood Updated: 07/04/22 0749     Special Requests  NO SPECIAL REQUESTS        Culture NO GROWTH 6 DAYS       Culture, Blood 1 [[id number]] Collected: 06/28/22 1558    Order Status: Completed Specimen: Blood Updated: 07/04/22 0749     Special Requests NO SPECIAL REQUESTS        Culture NO GROWTH 6 DAYS       Culture, Anaerobic [[id number]]  (Abnormal) Collected: 06/24/22 1647    Order Status: Completed Specimen: Tissue Updated: 06/26/22 1207     Special Requests SACRAL DECUBITUS     Culture       MODERATE BACTEROIDES THETAIOTAOMICRON BETA LACTAMASE POSITIVE                  MODERATE Bacteroides fragilis BETA LACTAMASE POSITIVE          Culture, Tissue [[id number]]  (Abnormal)  (Susceptibility) Collected: 06/24/22 1647    Order Status: Completed Specimen: Tissue Updated: 06/28/22 0820     Special Requests SACRAL DECUBITUS     Gram stain OCCASIONAL WBCS SEEN         NO ORGANISMS SEEN        Culture       RARE * Methicillin Resistant Staphylococcus aureus *                  RARE STAPHYLOCOCCUS SPECIES, COAGULASE NEGATIVE            (NOTE) PRELIMINARY REPORT OF MRSA CALLED TO L RIDDICK RN AT 3474 ON 11/24. CH.    Susceptibility        Methicillin-Resistant Staphylococcus aureus      BACTERIAL SUSCEPTIBILITY PANEL MIC      ciprofloxacin >=8 ug/mL Resistant      clindamycin  0.25 ug/mL Resistant      DAPTOmycin 0.25 ug/mL Sensitive      doxycycline 8 ug/mL Intermediate      erythromycin >=8 ug/mL Resistant      gentamicin <=0.5 ug/mL Sensitive      levofloxacin >=8 ug/mL Resistant      linezolid 4 ug/mL Sensitive      oxacillin >=4 ug/mL Resistant      tetracycline >=16 ug/mL Resistant      trimethoprim-sulfamethoxazole <=10 ug/mL Sensitive      vancomycin 1 ug/mL Sensitive                           Culture, Blood 1 [[id number]] Collected: 06/22/22 0433    Order Status: Completed Specimen: Blood Updated: 06/28/22 0608     Special Requests NO SPECIAL REQUESTS        Culture NO GROWTH 6 DAYS       Culture, Blood 2 [[id number]] Collected: 06/22/22 0433    Order Status:  Completed Specimen: Blood Updated: 06/28/22 0608     Special Requests NO SPECIAL REQUESTS        Culture NO GROWTH 6 DAYS       Culture, Wound Aerobic Only [[id number]] Collected: 06/20/22 2230    Order Status: Canceled Specimen: Sacrum     Blood Culture 2 [[id number]] Collected: 06/20/22 1820    Order Status: Completed Specimen: Blood Updated: 06/26/22 0707     Special Requests NO SPECIAL REQUESTS        Culture NO GROWTH 6 DAYS       Respiratory Panel, Molecular, with COVID-19 (Restricted: peds pts or suitable admitted adults) [[id number]] Collected: 06/20/22 1820    Order Status: Completed Specimen: Nasopharyngeal Updated: 06/20/22 1926     Adenovirus by PCR Not detected        Coronavirus 229E by PCR Not detected        Coronavirus HKU1 by PCR Not detected        Coronavirus NL63 by PCR Not detected        Coronavirus OC43 by PCR Not detected        SARS-CoV-2, PCR Not detected        Human Metapneumovirus by PCR Not detected        Rhinovirus Enterovirus PCR Not detected        Influenza A by PCR Not detected        Influenza B PCR Not detected        Parainfluenza 1 PCR Not detected        Parainfluenza 2 PCR Not detected        Parainfluenza 3 PCR Not detected        Parainfluenza 4 PCR Not detected        Respiratory Syncytial Virus by PCR Not detected        Bordetella parapertussis by PCR Not detected        Bordetella pertussis by PCR Not detected        Chlamydophila Pneumonia PCR Not detected        Mycoplasma pneumo by PCR Not detected       Culture, Blood, PCR ID Panel [[id number]]  (Abnormal) Collected: 06/20/22 1820    Order Status: Completed Specimen: Blood Updated: 06/21/22 1848     Accession Number [id number]     Enterococcus faecalis by PCR Not detected        Enterococcus faecium by PCR Not detected  Listeria monocytogenes by PCR Not detected        STAPHYLOCOCCUS Not detected        Staphylococcus Aureus Not detected        Staphylococcus epidermidis by PCR Not detected         Staphylococcus lugdunensis by PCR Not detected        STREPTOCOCCUS Not detected        Streptococcus agalactiae (Group B) Not detected        Strep pneumoniae Not detected        Strep pyogenes,(Grp. A) Not detected        Acinetobacter calcoac baumannii complex by PCR Not detected        Bacteroides fragilis by PCR Detected        Enterobacteriaceae by PCR Not detected        Enterobacter cloacae complex by PCR Not detected        Escherichia Coli Not detected        Klebsiella aerogenes by PCR Not detected        Klebsiella oxytoca by PCR Not detected        Klebsiella pneumoniae group by PCR Not detected        Proteus by PCR Not detected        Salmonella species by PCR Not detected        Serratia marcescens by PCR Not detected        Haemophilus Influenzae by PCR Not detected        Neisseria meningitidis by PCR Not detected        Pseudomonas aeruginosa Not detected        Stenotrophomonas maltophilia by PCR Not detected        Candida albicans by PCR Not detected        Candida auris by PCR Not detected        Candida glabrata Not detected        Candida krusei by PCR Not detected        Candida parapsilosis by PCR Not detected        Candida tropicalis by PCR Not detected        Cryptococcus neoformans/gattii by PCR Not detected        Resistant gene targets          [employer] test comment       False positive results may rarely occur. Correlate with clinical,epidemiologic, and other laboratory findings           Comment: Please see BCID Interpretation Guide in EPIC Links       Blood Culture 1 [[id number]]  (Abnormal) Collected: 06/20/22 1800    Order Status: Completed Specimen: Blood Updated: 06/23/22 1031     Special Requests NO SPECIAL REQUESTS        Gram stain       ANAEROBIC BOTTLE Gram negative rods                  SMEAR CALLED TO AND CORRECTLY REPEATED BY: RN W BARKER SVTSD ON [id number] AT 1836 HRS TO 1396           Culture       Bacteroides fragilis BETA LACTAMASE POSITIVE GROWING IN 1 OF 2 BOTTLES  DRAWN SITE=RFA                      RADIOLOGY:    All available imaging studies/reports in connect care for this admission were reviewed  Disclaimer: Sections of this note are dictated utilizing voice recognition software, which may have resulted in some phonetic based errors in grammar and contents. Even though attempts were made to correct all the mistakes, some may have been missed, and remained in the body of the document. If questions arise, please contact our department.    Dr. Raiford Simmonds, Infectious Disease Specialist  [phone]  July 04, 2022  2:38 PM

## 2022-07-04 NOTE — Care Coordination-Inpatient (Signed)
Call made to [facility] [phone], spoke with LaBrenda, dispatch will call the nurses station with ETA.  Trip # [phone].

## 2022-07-04 NOTE — Plan of Care (Signed)
Problem: Discharge Planning  Goal: Discharge to home or other facility with appropriate resources  07/04/2022 0145 by Claudie Revering, RN  Outcome: Progressing  Flowsheets (Taken 07/03/2022 2017)  Discharge to home or other facility with appropriate resources:   Identify barriers to discharge with patient and caregiver   Arrange for needed discharge resources and transportation as appropriate  07/03/2022 1145 by Vivianne Spence, RN  Outcome: Progressing  Flowsheets (Taken 07/03/2022 [phone])  Discharge to home or other facility with appropriate resources:   Identify barriers to discharge with patient and caregiver   Arrange for needed discharge resources and transportation as appropriate     Problem: Pain  Goal: Verbalizes/displays adequate comfort level or baseline comfort level  07/04/2022 0145 by Claudie Revering, RN  Outcome: Progressing  07/03/2022 1145 by Vivianne Spence, RN  Outcome: Progressing     Problem: Skin/Tissue Integrity  Goal: Absence of new skin breakdown  Description: 1.  Monitor for areas of redness and/or skin breakdown  2.  Assess vascular access sites hourly  3.  Every 4-6 hours minimum:  Change oxygen saturation probe site  4.  Every 4-6 hours:  If on nasal continuous positive airway pressure, respiratory therapy assess nares and determine need for appliance change or resting period.  07/04/2022 0145 by Claudie Revering, RN  Outcome: Progressing  07/03/2022 1145 by Vivianne Spence, RN  Outcome: Progressing     Problem: Safety - Adult  Goal: Free from fall injury  07/04/2022 0145 by Claudie Revering, RN  Outcome: Progressing  07/03/2022 1145 by Vivianne Spence, RN  Outcome: Progressing     Problem: ABCDS Injury Assessment  Goal: Absence of physical injury  07/04/2022 0145 by Claudie Revering, RN  Outcome: Progressing  07/03/2022 1145 by Vivianne Spence, RN  Outcome: Progressing  Flowsheets (Taken 07/01/2022 0915 by Morley Kos, RN)  Absence of Physical Injury: Implement safety  measures based on patient assessment     Problem: Chronic Conditions and Co-morbidities  Goal: Patient's chronic conditions and co-morbidity symptoms are monitored and maintained or improved  Outcome: Progressing  Flowsheets (Taken 07/03/2022 2017)  Care Plan - Patient's Chronic Conditions and Co-Morbidity Symptoms are Monitored and Maintained or Improved:   Monitor and assess patient's chronic conditions and comorbid symptoms for stability, deterioration, or improvement   Collaborate with multidisciplinary team to address chronic and comorbid conditions and prevent exacerbation or deterioration   Update acute care plan with appropriate goals if chronic or comorbid symptoms are exacerbated and prevent overall improvement and discharge     Problem: Nutrition Deficit:  Goal: Optimize nutritional status  Outcome: Progressing

## 2022-07-04 NOTE — Discharge Summary (Addendum)
Hospitalist Discharge Summary     Patient ID:  [name] Short  [id number]  66 y.o.  Jan 17, 1956    PCP on record: Will Bonnet, [place]    Admit date: 06/20/2022  Discharge date and time: 07/04/2022    Discharge Diagnoses:  and hospital course;     Patient admitted from home on 11/17 secondary to infection of stage IV decubitus sacral ulcer. She was receiving wound care at home but noticed increased pain and drainage. Patient underwent I&D and debridement with Dr. Yvone Neu (GS) on 11/21. Intra-op cultures were obtained. Dr. Allena Katz (ID) following for antibiotic recs.     Postop cultures grew out Bacteroides MRSA;    Blood Cultures also grew out Bacteroides.  Repeat blood cultures are negative;    Has been on IV antibiotics as per ID.  She is status post PICC line.    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 -->resolved. Remains on vanc and zosyn. ID recommends outpt IV abx, s/p PICC line.     Has wound Vacc on;     Bacteroides bacteremia, wound infection -    #1. Surgery (Drs. Kristen Loader, Eather Colas) following. Recommendation made for wound vac to sacrum, wound vac placed.       Continue vanc, Zosyn.thru 08/07/22; s/p Picc line;      Wound care consulted. Blood cultures redrawn on 11/19, NGTD. intra-op cultures-->MRSA (prelim/antigen detection).      Repeat b/c x 2; so fare negative;     DMT2, insulin dependent with hyperglycemia - SSI + Lantus      Hypertension, essential - with high BP; add coreg; and on Norvasc;     History of CVA - on ASA, statin      Hyperlipidemia - continue statin.      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 with external bone growth stimulator placement on Dec 09, 2021      Sleep apnea noncompliant with CPAP                                                        Consults: ID and general  surgery          Pertinent Lab Data:  No results for input(s): "WBC", "HGB", "HCT", "PLT" in the last 72 hours.  Recent Labs     07/04/22  0630   NA 143   K 3.6   CL 114*   CO2 27   BUN 21*       DISCHARGE MEDICATIONS:  @     Medication List        START taking these medications      carvedilol 6.25 MG tablet  Commonly known as: COREG  Take 1 tablet by mouth 2 times daily (with meals)     famotidine 20 MG tablet  Commonly known as: PEPCID  Take 1 tablet by mouth daily     lactobacillus capsule  Take 1 capsule by mouth daily (with breakfast)     piperacillin-tazobactam  infusion  Commonly known as: ZOSYN  Infuse 3.375 g intravenously every 6 hours Compound per protocol.  vancomycin  infusion  Commonly known as: VANCOCIN  Infuse 1,500 mg intravenously every 24 hours Compound per protocol.            CHANGE how you take these medications      * insulin lispro 100 UNIT/ML Soln injection vial  Commonly known as: HUMALOG  Sliding Scale insulin as per Institutional protocol  What changed: Another medication with the same name was changed. Make sure you understand how and when to take each.     * insulin lispro 100 UNIT/ML Soln injection vial  Commonly known as: HUMALOG  Inject 7 Units into the skin 3 times daily (with meals)  What changed: how much to take           * This list has 2 medication(s) that are the same as other medications prescribed for you. Read the directions carefully, and ask your doctor or other care provider to review them with you.                CONTINUE taking these medications      acetaminophen 500 MG tablet  Commonly known as: TYLENOL  Take 1 tablet by mouth every 6 hours as needed for Fever or Pain     aspirin 325 MG tablet     atorvastatin 80 MG tablet  Commonly known as: LIPITOR  Take 1 tablet by mouth nightly     baclofen 10 MG tablet  Commonly known as: LIORESAL     ferrous sulfate 325 (65 Fe) MG tablet  Commonly known as: IRON 325  Take 1 tablet by mouth daily     furosemide 40 MG  tablet  Commonly known as: LASIX     insulin glargine 100 UNIT/ML injection vial  Commonly known as: LANTUS  Inject 30 Units into the skin daily     Magnesium Oxide 400 MG Caps     therapeutic multivitamin-minerals tablet  Take 1 tablet by mouth daily            STOP taking these medications      KLOR-CON M10 PO     Sensipar 30 MG tablet  Generic drug: cinacalcet               Where to Get Your Medications        [id number] [place] [place] - [address]  [place], [place] [address]      Phone: [phone]   carvedilol 6.25 MG tablet  famotidine 20 MG tablet  insulin lispro 100 UNIT/ML Soln injection vial       Information about where to get these medications is not yet available    Ask your nurse or doctor about these medications  insulin glargine 100 UNIT/ML injection vial  lactobacillus capsule  piperacillin-tazobactam  infusion  vancomycin  infusion             My Recommended Diet, Activity, Wound Care, and follow-up labs are listed in the patient's Discharge Insturctions which I have personally completed and reviewed.      Disposition:     []  Home with family     []  HH PT/RN   [x]  SNF/NH   []  Inpatient Rehab/SAHR  Condition at Discharge:  Stable    Follow up with:   PCP : Ignacia Palma, PA      >30 minutes spent coordinating this discharge (review instructions/follow-up, prescriptions, preparing report for sign off)  Signed:  Rachel Bo, MD  07/04/2022  12:05 PM

## 2022-07-04 NOTE — Progress Notes (Signed)
[facility] Pharmacokinetic Monitoring Service - Vancomycin    Indication: sepsis  Goal AUC/MIC: 400-600 mg*hr/L  Day of Therapy: 14  Additional Antimicrobials: pip-tazo    Pertinent Laboratory Values:   Wt Readings from Last 1 Encounters:   06/22/22 81.1 kg (178 lb 12.7 oz)     Temp Readings from Last 1 Encounters:   07/04/22 97.9 F (36.6 C) (Oral)     Estimated Creatinine Clearance: 68 mL/min (based on SCr of 0.81 mg/dL).    Recent Labs     07/04/22  0630   CREATININE 0.81   BUN 21*       Pertinent Cultures:  Date Source Results   11/17 blood Bacteroides in 1/2   11/19 blood NGF   11/21 wound MRSA, CoNS   11/25 blood NGTD   MRSA Nasal Swab: NA    Assessment:  Date Current Dose Level (mg/L) Timing of Level (h) AUC/MIC   11/18 1,500 mg x1  750 mg x1 - - -   11/19 1,000 mg q18h 20.3 4 461   11/20 1,000 mg q18h - - -   11/21 1,000 mg q18h 12.3 13 477   11/22 1,000 mg q18h - - -   11/23 1,000 mg q18h - - -   11/24 1,000 mg q18h - - -   11/25 1,000 mg q18h - - -   11/26 1,000 mg q18h - - -   11/27 1000mg  q18h      11/28 1000mg  q18h  1500mg  q24h 13.6   9.75 401   Note: Serum concentrations collected for AUC dosing may appear elevated if collected in close proximity to the dose administered, this is not necessarily an indication of toxicity    Plan:  Continue vancomycin to 1500mg  q24h,   Repeat level as indicated  Pharmacy will continue to monitor patient and adjust therapy as indicated    Thank you for the consult,  Granville Lewis, [facility]  07/04/2022

## 2022-07-04 NOTE — Care Coordination-Inpatient (Addendum)
Requested Case Management specialist to assist with transportation to [facility] .  Address is [address] [place]  [address] and phone number is [phone]    Patient will require BLS transport.   Pt requires Stretcher If stretcher, reason: limited mobility, arthritis, sacral ulcers, CVA, diabetes  Patient is currently requiring oxygen    Height:4 11   Weight: 178   Pt is on isolation:  MRSA  Isolation is for: MRSA  Is the pt ready now? no  Requested time: 12:00 pm  PCS Faxed: no  Insurance verified on face sheet: yes  Auth needed for transport:   CM completed PCS/ Envelope and placed on chart.

## 2022-07-04 NOTE — Progress Notes (Signed)
Patient discharged to [facility]. Report given to [facility] LPN , questions answered. Patient transported at 1555 wound vac removed and wet to dry dressing applied.

## 2022-07-04 NOTE — IP Home Care (Signed)
Notified [facility]; Scheduling; Dietitian.  Active patient to transfer to [facility] and [facility].

## 2022-07-07 ENCOUNTER — Encounter: Payer: PRIVATE HEALTH INSURANCE | Primary: Physician Assistant

## 2022-07-09 ENCOUNTER — Encounter: Payer: PRIVATE HEALTH INSURANCE | Primary: Physician Assistant

## 2022-07-11 ENCOUNTER — Encounter: Payer: PRIVATE HEALTH INSURANCE | Primary: Physician Assistant

## 2022-07-14 ENCOUNTER — Encounter: Payer: PRIVATE HEALTH INSURANCE | Primary: Physician Assistant

## 2022-07-16 ENCOUNTER — Encounter: Payer: PRIVATE HEALTH INSURANCE | Primary: Physician Assistant

## 2022-07-16 ENCOUNTER — Other Ambulatory Visit: Payer: Self-pay | Admitting: Student

## 2022-07-16 ENCOUNTER — Other Ambulatory Visit: Payer: Self-pay | Admitting: Internal Medicine

## 2022-07-16 DIAGNOSIS — I5042 Chronic combined systolic (congestive) and diastolic (congestive) heart failure: Secondary | ICD-10-CM

## 2022-07-16 DIAGNOSIS — Z8639 Personal history of other endocrine, nutritional and metabolic disease: Secondary | ICD-10-CM

## 2022-07-18 ENCOUNTER — Encounter: Payer: PRIVATE HEALTH INSURANCE | Primary: Physician Assistant

## 2022-07-18 ENCOUNTER — Ambulatory Visit
Admit: 2022-07-18 | Discharge: 2022-07-18 | Payer: PRIVATE HEALTH INSURANCE | Attending: Surgery | Primary: Physician Assistant

## 2022-07-18 DIAGNOSIS — L89154 Pressure ulcer of sacral region, stage 4: Secondary | ICD-10-CM

## 2022-07-18 NOTE — Progress Notes (Signed)
Jerricka Carvey is a 66 y.o. female (DOB: 12-19-1955)     Chief Complaint   Patient presents with    Follow-up     Ulcer        Medication list and allergies have been reviewed with Marina Gravel and updated as of today's date.     I have gone over all Medical, Surgical and Social History with Marina Gravel and updated/added the information accordingly.     1. Have you been to the ER, urgent care clinic since your last visit?  Hospitalized since your last visit?Yes Where: [facility]     2. Have you seen or consulted any other health care providers outside of the [facility] since your last visit?  Include any pap smears or colon screening. No

## 2022-07-18 NOTE — Telephone Encounter (Signed)
Pt asking for [facility]ill [facility] [address]

## 2022-07-19 MED ORDER — HYDROCODONE-ACETAMINOPHEN 5-325 MG PO TABS
5-325 MG | ORAL_TABLET | ORAL | 0 refills | Status: AC | PRN
Start: 2022-07-19 — End: 2022-07-21

## 2022-07-20 NOTE — Progress Notes (Signed)
CC:   Chief Complaint   Patient presents with    Follow-up     Ulcer         Assessment:    ICD-10-CM    1. Sacral decubitus ulcer, stage IV (HCC)  L89.154 HYDROcodone-acetaminophen (NORCO) 5-325 MG per tablet          Plan: They are taking excellent care of her wound and it is progressing nicely. Continue with wound vac therapy. Refill of pain medication prescribed. Follow up in 2-3 months or sooner should there be any questions or concerns. She agrees with this plan.       HPI:  [name] Short is a 66 y.o. female who is here today for her initial follow up 06/24/2022 debridement of stage 4 sacral decubitus with placement of wound vac. She is doing well at her facility. She has her usual pain with wound vac changes and requests refill of pain medication. No fevers or chills. Otherwise she is without complaints.      Allergies:  Allergies   Allergen Reactions    Lactose Other (See Comments)     Intolerance       Medication Review:  Current Outpatient Medications on File Prior to Visit   Medication Sig Dispense Refill    insulin glargine (LANTUS) 100 UNIT/ML injection vial Inject 30 Units into the skin daily 1 each 2    famotidine (PEPCID) 20 MG tablet Take 1 tablet by mouth daily 60 tablet 3    insulin lispro (HUMALOG) 100 UNIT/ML SOLN injection vial Inject 7 Units into the skin 3 times daily (with meals) 1 each 0    lactobacillus (CULTURELLE) capsule Take 1 capsule by mouth daily (with breakfast) 60 capsule 0    carvedilol (COREG) 6.25 MG tablet Take 1 tablet by mouth 2 times daily (with meals) 60 tablet 3    piperacillin-tazobactam (ZOSYN) infusion Infuse 3.375 g intravenously every 6 hours Compound per protocol. 405 g 1    vancomycin (VANCOCIN) infusion Infuse 1,500 mg intravenously every 24 hours Compound per protocol. 45 g 1    baclofen (LIORESAL) 10 MG tablet Take 1 tablet by mouth 3 times daily as needed (PAIN) Indications: Pain FOR PAIN PER SHETH      Magnesium Oxide 400 MG CAPS Take 1 capsule by mouth daily.       aspirin 325 MG tablet Take 1 tablet by mouth daily      acetaminophen (TYLENOL) 500 MG tablet Take 1 tablet by mouth every 6 hours as needed for Fever or Pain 30 tablet 0    insulin lispro (HUMALOG) 100 UNIT/ML SOLN injection vial Sliding Scale insulin as per Institutional protocol 1 each 0    atorvastatin (LIPITOR) 80 MG tablet Take 1 tablet by mouth nightly 30 tablet 3    ferrous sulfate (IRON 325) 325 (65 Fe) MG tablet Take 1 tablet by mouth daily 30 tablet     Multiple Vitamins-Minerals (THERAPEUTIC MULTIVITAMIN-MINERALS) tablet Take 1 tablet by mouth daily      furosemide (LASIX) 40 MG tablet furosemide 40 mg tablet   TAKE 1 TABLET BY MOUTH EVERY DAY       Current Facility-Administered Medications on File Prior to Visit   Medication Dose Route Frequency Provider Last Rate Last Admin    vancomycin (VANCOCIN) injection 1,500 mg  1,500 mg IntraVENous Q24H Gordy Levan, MD           Systems Review:  Review of Systems   Constitutional:  Negative for fever.  PMH:  Past Medical History:   Diagnosis Date    Anemia     Arthritis     Chronic pain     Legs and Shoulders    Diabetes (HCC)     Exposure to asbestos     History of blood transfusion     History of colon polyps     HTN (hypertension)     Hx of blood clots     DVT    Hyperlipemia     Menopause     Pulmonary nodule     Serum calcium elevated     Sleep apnea     not using cpap    Stroke [facility](HCC)        Surgical History:  Past Surgical History:   Procedure Laterality Date    BACK SURGERY N/A 06/24/2022    INCISION AND DRAINAGE /DEBRIDEMENT SACRAL DECUBITUS performed by Phineas SemenYeshtokin, Kaari Zeigler, DO [facility]    CESAREAN SECTION  1987    COLONOSCOPY N/A 01/13/2017    COLONOSCOPY performed by Levan Hursthomas Duntemann, MD at [facility] ENDOSCOPY    COLONOSCOPY      DILATION AND CURETTAGE OF UTERUS  1995    RECTAL SURGERY N/A 12/28/2021    SACRAL WOUND DEBRIDEMENT INCISION AND DRAINAGE performed by Phineas SemenNicole Jenica Costilow, [facility]    SPINE SURGERY N/A 12/09/2021    CERVICAL  THREE/FOUR/FIVE/SIX LAMINECTOMY FUSION; C-ARM; STRYKER; EXT BONE GROWTH STIM; 23 HR performed by Pamalee LeydenMark B Kerner, [facility]       Social History:  Social History     Socioeconomic History    Marital status: Single     Spouse name: None    Number of children: None    Years of education: None    Highest education level: None   Tobacco Use    Smoking status: Former     Current packs/day: 0.00     Types: Cigarettes, Cigars     Quit date: 11/13/2021     Years since quitting: 0.6    Smokeless tobacco: Never   Vaping Use    Vaping Use: Never used   Substance and Sexual Activity    Alcohol use: Not Currently     Comment: occ    Drug use: Never     Social Determinants of Health     Financial Resource Strain: Low Risk  (01/05/2022)    Overall Financial Resource Strain (CARDIA)     Difficulty of Paying Living Expenses: Not hard at all   Transportation Needs: No Transportation Needs (02/09/2022)    OASIS A1250: Transportation     Lack of Transportation (Medical): No     Lack of Transportation (Non-Medical): No     Patient Unable or Declines to Respond: No   Physical Activity: Inactive (01/05/2022)    Exercise Vital Sign     Days of Exercise per Week: 0 days     Minutes of Exercise per Session: 0 min   Stress: No Stress Concern Present (01/05/2022)    [employer] - Occupational Stress Questionnaire     Feeling of Stress : Not at all   Social Connections: Feeling Socially Integrated (02/09/2022)    OASIS D0700: Social Isolation     Frequency of experiencing loneliness or isolation: Never   Recent Concern: Social Connections - Moderately Isolated (01/05/2022)    Social Connection and Isolation Panel [NHANES]     Frequency of Communication with Friends and Family: Three times a week  Frequency of Social Gatherings with Friends and Family: More than three times a week     Attends Religious Services: More than 4 times per year     Active Member of Clubs or Organizations: No     Attends Banker  Meetings: Never     Marital Status: Widowed   Intimate Partner Violence: Not At Risk (01/05/2022)    Humiliation, Afraid, Rape, and Kick questionnaire     Fear of Current or Ex-Partner: No     Emotionally Abused: No     Physically Abused: No     Sexually Abused: No   Housing Stability: Low Risk  (01/05/2022)    Housing Stability Vital Sign     Unable to Pay for Housing in the Last Year: No     Number of Places Lived in the Last Year: 1     Unstable Housing in the Last Year: No       Family History:  Family History   Problem Relation Age of Onset    Diabetes Other 40        parent, NOS    Cancer Father     Hypertension Other 35        parent,NOS    Heart Disease Other 76        parent, NOS    Lung Disease Father        No results displayed because visit has over 200 results.          XR CHEST PORTABLE  Narrative: EXAM: Chest Radiograph    INDICATION:  Sepsis    TECHNIQUE: AP view of the chest    COMPARISON: 12/26/2021, 04/22/2018, 08/01/2017    FINDINGS: No pneumothorax identified.  Mild interstitial opacities are noted. No  effusions identified.  Cardiac silhouette is prominent.   Mild prominence of the  pulmonary vascular tree. Minimal degenerative changes are noted.  Impression: 1.  Mild pulmonary edema.   CT ABDOMEN PELVIS W IV CONTRAST Additional Contrast? None  Narrative: EXAM: CT ABDOMEN AND PELVIS WITH CONTRAST    CLINICAL INDICATION/HISTORY: sacral wound with concern for infection, recent  fall with pain to buttock. Weakness and fever for 2 days. Weakness. Stage IV  sacral ulcer. Increased drainage. Fall today.    COMPARISON: CT abdomen/pelvis 12/28/2021    TECHNIQUE:  CT abdomen and pelvis with IV contrast.   All CT scans at this facility are performed using dose optimization technique as  appropriate to the performed examination, to include automated exposure control,  adjustment of the mA and/or kV according to patient's size (including  appropriate matching for site-specific examinations), or use of an  iterative  reconstruction technique.    FINDINGS:     Lower chest: Partially visualized 45mm right middle lobe lung nodule is unchanged  from April 2022.    Liver: Unremarkable.     Biliary: Unremarkable.     Pancreas: Unremarkable.     Spleen: Unremarkable.     Adrenal glands: Unremarkable.     Kidneys: Unremarkable.     Bladder: Unremarkable.     Reproductive organs: Calcified uterine fibroid. Stable 2.6 cm right adnexal  cyst.     Bowel: Moderate stool burden. Normal appendix. No evidence for bowel  obstruction.     Lymph nodes: No pathologically enlarged lymph nodes.     Vasculature: Mild burden of calcified atherosclerotic disease.     Other: No free fluid or free air.    Body wall: Extensive body wall edema. Ill-defined fluid  and gas in the  subcutaneous fat overlying the sacrum, just right of midline. Collection overall  measures 6.2 x 2.7 x 8.7 cm. Significant surrounding stranding. This appears to  extend to the underlying sacrum (series 2, image 4-56). Also extends into the  gluteal cleft on the left (2, 64).    Bones: There is some erosion of the posterior elements of the midline sacrum  underlying the above-described inflammatory process. (series 3, image [id number]).  Multilevel degenerative changes in the spine.  Impression: 1. Large ill-defined fluid and gas collection in the subcutaneous fat overlying  the sacrum, just right of midline. This is concerning for phlegmon or early  abscess. There is extension to the underlying sacrum, with erosion of the  posterior elements of the midline sacrum, consistent with osteomyelitis.  -Extensive surrounding stranding and edema.    2. Moderate stool burden, correlate for constipation.    3. 8 mm right middle lobe lung nodule is unchanged since at least April 2022.  Recommend follow-up in April 2024 to ensure two-year stability.    4. Stable small right adnexal cyst.  CT HEAD WO CONTRAST  Narrative: CT of the head without contrast    HISTORY: Weakness and fever for 2  days. Weakness. Stage IV sacral ulcer.  Increased drainage. Fall today.    COMPARISON: CT head 12/27/2021, MR brain 12/31/2018.    TECHNIQUE: Axial images of the brain were obtained, without the administration  of intravenous contrast. Coronal and sagittal reconstructions were generated.    All CT scans at this facility are performed using dose optimization technique as  appropriate to a performed exam, to include automated exposure control,  adjustment of the MA and/or kV according to patient size (including appropriate  matching for site-specific examinations) or use of  iterative reconstruction  technique.    FINDINGS:     Paranasal sinuses and mastoid air cells: Paranasal sinuses are clear. Mastoid  air cells are clear.    Calvarium: No acute fracture.    Brain: Chronic left cerebellar infarct. Previously seen left thalamic infarct  not is well visualized. Chronic infarct in the pons. No acute intracranial  hemorrhage. No mass effect or midline shift. Gray-white matter differentiations  are maintained.  Impression: No acute intracranial abnormality.      Chronic pontine and left cerebellar infarcts. Previously seen left thalamic  lacunar infarct not well visualized.            Physical Exam:  BP (!) 142/82   Pulse 61   Temp 97.6 F (36.4 C)   Resp 16   Ht 1.499 m (4' 11.02")   SpO2 100%   BMI 36.09 kg/m  BMI: Body mass index is 36.09 kg/m.    Physical Exam  Skin:     Comments: Wound vac removed- pink granulation tissue bed of the wound- minimal serousanginous drainage in canister. No periwound erythema or breakdown of skin   Neurological:      Mental Status: She is alert.         I have reviewed the information entered by the clinical staff and/or patient and verified it as accurate or edited where necessary.     Electronically signed by:    Nestor Ramp, DO, MPH

## 2022-07-21 ENCOUNTER — Encounter: Payer: PRIVATE HEALTH INSURANCE | Primary: Physician Assistant

## 2022-07-23 ENCOUNTER — Encounter: Payer: PRIVATE HEALTH INSURANCE | Primary: Physician Assistant

## 2022-07-23 ENCOUNTER — Ambulatory Visit (INDEPENDENT_AMBULATORY_CARE_PROVIDER_SITE_OTHER): Payer: Medicare Other | Admitting: Podiatry

## 2022-07-23 ENCOUNTER — Encounter: Payer: Self-pay | Admitting: Podiatry

## 2022-07-23 DIAGNOSIS — B353 Tinea pedis: Secondary | ICD-10-CM

## 2022-07-23 DIAGNOSIS — M79674 Pain in right toe(s): Secondary | ICD-10-CM

## 2022-07-23 DIAGNOSIS — E1142 Type 2 diabetes mellitus with diabetic polyneuropathy: Secondary | ICD-10-CM

## 2022-07-23 DIAGNOSIS — M79675 Pain in left toe(s): Secondary | ICD-10-CM

## 2022-07-23 DIAGNOSIS — B351 Tinea unguium: Secondary | ICD-10-CM

## 2022-07-23 MED ORDER — CLOTRIMAZOLE-BETAMETHASONE 1-0.05 % EX CREA: TOPICAL_CREAM | CUTANEOUS | 1 refills | Status: AC

## 2022-07-25 ENCOUNTER — Encounter: Payer: PRIVATE HEALTH INSURANCE | Primary: Physician Assistant

## 2022-07-25 NOTE — Progress Notes (Signed)
  Subjective:  Patient ID: Julie Elliott, female    DOB: [birth year],  MRN: [id number]  Julie Elliott presents to clinic today for at risk foot care with history of diabetic neuropathy and painful thick toenails that are difficult to trim. Pain interferes with ambulation. Aggravating factors include wearing enclosed shoe gear. Pain is relieved with periodic professional debridement.   She is accompanied by her son, [name], on today's visit.  Chief Complaint  Patient presents with   diabetic foot care    Last seen PCP a few months ago   New problem(s): None.   PCP is Rick Duff, MD.  Allergies  Allergen Reactions   Loratadine-Pseudoephedrine Er Hives    Claritin-D    Review of Systems: Negative except as noted in the HPI.  Objective: No changes noted in today's physical examination. There were no vitals filed for this visit.  Julie Elliott is a pleasant 66 y.o. female obese in NAD. AAO x 3.  Neurovascular Examination: CFT <3 seconds b/l LE. Palpable DP pulse(s) b/l LE. Palpable PT pulse(s) b/l LE. Pedal hair absent. No pain with calf compression b/l. Lower extremity skin temperature gradient within normal limits. Nonpitting edema noted BLE. No cyanosis or clubbing noted b/l LE.  Pt has subjective symptoms of neuropathy. Protective sensation intact 5/5 intact bilaterally with 10g monofilament b/l. Vibratory sensation intact b/l.  Dermatological:  Pedal skin is warm and supple b/l LE. oenails 1-5 bilaterally elongated, discolored, dystrophic, thickened, and crumbly with subungual debris and tenderness to dorsal palpation. Multiple nevi noted b/l LE, medial aspect right foot and medial aspect right 5th digit.   No hyperkeratotic lesions noted today.  Interdigital maceration webspaces 3rd and 4th web spaces bilaterally have resolved. No breaks in skin, no blistering, no signs of secondary bacterial infection.  Diffuse scaling noted peripherally and plantarly b/l feet.  No  interdigital macerations.  No blisters, no weeping. No signs of secondary bacterial infection noted.  Musculoskeletal:  Muscle strength 5/5 to all lower extremity muscle groups bilaterally. No pain, crepitus or joint limitation noted with ROM bilateral LE. No gross bony deformities bilaterally.  Assessment/Plan: 1. Pain due to onychomycosis of toenails of both feet   2. Tinea pedis of both feet   3. Diabetic peripheral neuropathy associated with type 2 diabetes mellitus ([place])     Meds ordered this encounter  Medications   clotrimazole-betamethasone (LOTRISONE) cream    Sig: Apply to both feet twice daily for 6 weeks.    Dispense:  45 g    Refill:  1    -Patient's family member present. All questions/concerns addressed on today's visit. -Interdigital maceration bilaterally has resolved. Discontinue betadine solution. -Continue diabetic foot care principles: inspect feet daily, monitor glucose as recommended by PCP and/or Endocrinologist, and follow prescribed diet per PCP, Endocrinologist and/or dietician. -Continue supportive shoe gear daily. -Mycotic toenails 1-5 bilaterally were debrided in length and girth with sterile nail nippers and dremel without incident. -For tinea pedis, Rx refilled for Lotrisone Cream 1%/0.05% to be applied to affected foot/feet twice daily for 6 weeks. -Patient/POA to call should there be question/concern in the interim.   Return in about 3 months (around 10/22/2022).  Marzetta Board, DPM

## 2022-07-26 LAB — HEMOGLOBIN A1C
Estimated Avg Glucose, External: 157 mg/dL — ABNORMAL HIGH (ref 91–123)
Hemoglobin A1C, External: 7.1 % — ABNORMAL HIGH (ref 4.8–5.6)

## 2022-07-28 ENCOUNTER — Encounter: Payer: PRIVATE HEALTH INSURANCE | Primary: Physician Assistant

## 2022-07-30 ENCOUNTER — Encounter: Payer: PRIVATE HEALTH INSURANCE | Primary: Physician Assistant

## 2022-08-01 ENCOUNTER — Encounter: Payer: PRIVATE HEALTH INSURANCE | Primary: Physician Assistant

## 2022-08-09 ENCOUNTER — Emergency Department: Admit: 2022-08-10 | Payer: PRIVATE HEALTH INSURANCE | Primary: Physician Assistant

## 2022-08-09 ENCOUNTER — Emergency Department: Payer: PRIVATE HEALTH INSURANCE | Primary: Physician Assistant

## 2022-08-09 DIAGNOSIS — U071 COVID-19: Secondary | ICD-10-CM

## 2022-08-09 NOTE — ED Provider Notes (Signed)
 [facility] Care  Emergency Department Treatment Report  Patient: [name] Short Age: 67 y.o. Sex: female    Date of Birth: [birth year] Admit Date: 08/09/2022 PCP: Barrington Lied, PA   MRN: [id number]  CSN: [id number]  Attending: Dr. Jeanie Miller, Jone Neither, MD    Room: ER10/ER10 Time Dictated: 5:48 AM APP: Bernita Bristle, MPA, PA-C     (Note to patient:The purpose of this note is to communicate optimally with the other providers involved in your care. It is written using standard medical terminology. The Dragon dictation tool was used in preparation of this note, so please excuse typos/grammatical errors.)    Chief Complaint      Chief Complaint   Patient presents with    Leg Pain    Altered Mental Status       History of Present Illness   67 y.o. female who presents to the ED with past medical history of diabetes, hypertension, history of DVT, history of stroke and sleep apnea who presents to the emergency room tonight via EMS from [facility] for evaluation of bilateral lower extremity pain and fever.    Per medic, she has had a wound VAC for the sacral decubitus ulcer which was infected.  According to EMS, the wound VAC had been turned off for 2 days.  She developed a fever and the staff had been treating the fever with Tylenol .  She tells me that she has been on antibiotics through a PICC line in her right upper extremity.  She tells me that she thinks that they just finished 2 days ago as well.    Patient is not sure exactly how high her fever was, she does tell me that both of her legs have been hurting.  Per my review of the discharge medication list from [facility], she does not appear to have an anticoagulant on her discharge medication list but on her history she does have a history of DVT.  Patient endorses a cough, she states that she thinks it has been productive.    Patient was transported from facility per request of daughter because she felt that the patient was altered.  En route,  patient's blood sugar was noted to be elevated at 301 per EMS.      RECORDS REVIEWED: I reviewed the patient's previous records here at [facility] and available outside facilities and note that patient was admitted with severe sepsis secondary to sacral abscess with osteomyelitis on November 17 2 [facility] view hospital.  She was noted to have bacteroides uremia wound infection and underwent surgery with Dr. Alveda Jing (GS) on 06/24/22 and had an incision and drainage and debridement, intraoperative cultures were obtained, patient was placed on a wound VAC at that time.  Cultures came back positive for MRSA.  Infectious disease was consulted and PICC line was placed and patient was discharged home with Vanco and Zosyn .    EXTERNAL RESULTS REVIEWED: Labs, imaging, note    INDEPENDENT HISTORIAN: History and/or plan development assisted by: EMS    Review of Systems     As per HPI    Past Medical History     Past Medical History:   Diagnosis Date    Anemia     Arthritis     Chronic pain     Legs and Shoulders    Diabetes (HCC)     Exposure to asbestos     History of blood transfusion     History of colon polyps  HTN (hypertension)     Hx of blood clots     DVT    Hyperlipemia     Menopause     Pulmonary nodule     Serum calcium elevated     Sleep apnea     not using cpap    Stroke [facility])        Surgical History     Past Surgical History:   Procedure Laterality Date    BACK SURGERY N/A 06/24/2022    INCISION AND DRAINAGE /DEBRIDEMENT SACRAL DECUBITUS performed by Drena Gentile, DO at [facility] MAIN OR    CESAREAN SECTION  1987    COLONOSCOPY N/A 01/13/2017    COLONOSCOPY performed by Geneva Kerbs, MD at [facility] ENDOSCOPY    COLONOSCOPY      DILATION AND CURETTAGE OF UTERUS  1995    RECTAL SURGERY N/A 12/28/2021    SACRAL WOUND DEBRIDEMENT INCISION AND DRAINAGE performed by Drena Gentile, DO at [facility] MAIN OR    SPINE SURGERY N/A 12/09/2021    CERVICAL THREE/FOUR/FIVE/SIX LAMINECTOMY FUSION; C-ARM; STRYKER; EXT BONE GROWTH STIM; 23 HR  performed by Alvino Aye, MD at [facility] MAIN OR       Family History     Family History   Problem Relation Age of Onset    Diabetes Other 77        parent, NOS    Cancer Father     Hypertension Other 35        parent,NOS    Heart Disease Other 34        parent, NOS    Lung Disease Father        Social History   Mykalah Saari  reports that she quit smoking about 8 months ago. Her smoking use included cigarettes and cigars. She has never used smokeless tobacco. She reports that she does not currently use alcohol. She reports that she does not use drugs.     Current Medications     Prior to Admission medications    Medication Sig Start Date End Date Taking? Authorizing Provider   insulin  glargine (LANTUS ) 100 UNIT/ML injection vial Inject 30 Units into the skin daily 07/02/22   Alois Jakob, MD   famotidine  (PEPCID ) 20 MG tablet Take 1 tablet by mouth daily 07/03/22   Saini, Kanwarpreet S, MD   insulin  lispro (HUMALOG ) 100 UNIT/ML SOLN injection vial Inject 7 Units into the skin 3 times daily (with meals) 07/02/22   Alois Jakob, MD   lactobacillus (CULTURELLE) capsule Take 1 capsule by mouth daily (with breakfast) 07/03/22   Saini, Kanwarpreet S, MD   carvedilol  (COREG ) 6.25 MG tablet Take 1 tablet by mouth 2 times daily (with meals) 07/02/22   Alois Jakob, MD   baclofen  (LIORESAL ) 10 MG tablet Take 1 tablet by mouth 3 times daily as needed (PAIN) Indications: Pain FOR PAIN PER SHETH 04/07/22   Randal Bury D, PA   Magnesium  Oxide 400 MG CAPS Take 1 capsule by mouth daily.    [provider]   aspirin  325 MG tablet Take 1 tablet by mouth daily    [provider]   acetaminophen  (TYLENOL ) 500 MG tablet Take 1 tablet by mouth every 6 hours as needed for Fever or Pain 01/23/22   Halbert Levee, MD   insulin  lispro (HUMALOG ) 100 UNIT/ML SOLN injection vial Sliding Scale insulin  as per Institutional protocol 01/23/22   Halbert Levee, MD   atorvastatin  (LIPITOR ) 80 MG tablet Take 1  tablet by mouth nightly 01/06/22   Jimmye Moulds, MD   ferrous sulfate  (IRON  325) 325 (65 Fe) MG tablet Take 1 tablet by mouth daily 01/06/22   Jimmye Moulds, MD   Multiple Vitamins-Minerals (THERAPEUTIC MULTIVITAMIN-MINERALS) tablet Take 1 tablet by mouth daily 01/07/22   Jimmye Moulds, MD   furosemide  (LASIX ) 40 MG tablet furosemide  40 mg tablet   TAKE 1 TABLET BY MOUTH EVERY DAY 09/23/21   [provider]        Allergies     Allergies   Allergen Reactions    Lactose Other (See Comments)     Intolerance       Physical Exam     ED TRIAGE VITALS    ED Triage Vitals [08/09/22 2040]   BP Temp Temp src Pulse Respirations SpO2 Height Weight - Scale   (!) 193/75 98.7 F (37.1 C) -- 70 20 100 % 1.702 m (5\' 7" ) 71.7 kg (158 lb)        Physical Exam  Vitals and nursing note reviewed.   Constitutional:       Appearance: She is ill-appearing (chronically).   HENT:      Head: Normocephalic and atraumatic.      Right Ear: External ear normal.      Left Ear: External ear normal.      Nose: Nose normal.      Mouth/Throat:      Mouth: Mucous membranes are dry.   Eyes:      Extraocular Movements: Extraocular movements intact.      Conjunctiva/sclera: Conjunctivae normal.      Pupils: Pupils are equal, round, and reactive to light.   Cardiovascular:      Rate and Rhythm: Normal rate and regular rhythm.   Pulmonary:      Effort: Pulmonary effort is normal. No respiratory distress.   Abdominal:      General: There is no distension.      Palpations: Abdomen is soft.      Tenderness: There is no abdominal tenderness.   Musculoskeletal:         General: No signs of injury.      Cervical back: Normal range of motion.   Skin:     General: Skin is warm and dry.      Capillary Refill: Capillary refill takes less than 2 seconds.      Findings: Wound (sacral decubitus ulcer) present.   Neurological:      General: No focal deficit present.      Mental Status: She is alert.   Psychiatric:          Behavior: Behavior normal.          Impression and Management Plan   67 y.o. female presents with wound with fever and reported AMS, Possible sepsis:    -  initiate sepsis evaluation     -  place IV    -  chest x-ray    -  blood cultures    -  CBC    -  complete metabolic panel    -  lactic acid    -  procalcitonin    -  point-of-care urine dipstick with reflex urine culture.      -  weight based fluid resuscitation.      -  viral swab    DDx includes but is not limited to: sepsis/severe sepsis/septic shock,meningitis, UTI, pneumonia, cellulitis, endocarditis, gastroenteritis, emergent abdominal infection     Further testing and management based on  pts response and evolution of sxms.     Diagnostic Studies   Lab:   Recent Results (from the past 24 hour(s))   COVID-19, Flu A/B, and RSV Combo    Collection Time: 08/09/22  9:04 PM    Specimen: Nasopharyngeal Swab   Result Value Ref Range    SARS-CoV-2 POSITIVE (A) NEGATIVE      Influenza A H1 (Seasonal) PCR NEGATIVE NEGATIVE      Influenza virus B RNA NEGATIVE NEGATIVE      RSV A/B PCR NEGATIVE NEGATIVE     Comprehensive Metabolic Panel    Collection Time: 08/09/22 11:00 PM   Result Value Ref Range    Potassium 3.0 (L) 3.5 - 5.1 mEq/L    Chloride 109 (H) 98 - 107 mEq/L    Sodium 147 (H) 136 - 145 mEq/L    CO2 31 20 - 31 mEq/L    Glucose 232 (H) 74 - 106 mg/dl    BUN 16 9 - 23 mg/dl    Creatinine 8.29 5.62 - 1.02 mg/dl    GFR African American >60.0      GFR Non-African American >60      Calcium  10.3 8.7 - 10.4 mg/dl    Anion Gap 7 5 - 15 mmol/L    AST 39.0 (H) 0.0 - 33.9 U/L    ALT 34 10 - 49 U/L    Alkaline Phosphatase 118 (H) 46 - 116 U/L    Total Bilirubin 0.20 (L) 0.30 - 1.20 mg/dl    Total Protein 6.4 5.7 - 8.2 gm/dl    Albumin 2.7 (L) 3.4 - 5.0 gm/dl   CBC with Auto Differential    Collection Time: 08/09/22 11:00 PM   Result Value Ref Range    WBC 4.5 4.0 - 11.0 1000/mm3    RBC 4.68 3.60 - 5.20 M/uL    Hemoglobin 11.4 11.0 - 16.0 gm/dl    Hematocrit 13.0 86.5 -  47.0 %    MCV 77.8 (L) 80.0 - 98.0 fL    MCH 24.4 (L) 25.4 - 34.6 pg    MCHC 31.3 30.0 - 36.0 gm/dl    Platelets 784 696 - 450 1000/mm3    MPV 9.8 6.0 - 10.0 fL    RDW 49.1 (H) 36.4 - 46.3      Nucleated RBCs 0 0 - 0      Immature Granulocytes 0.2 0.0 - 3.0 %    Neutrophils Segmented 65.5 (H) 34 - 64 %    Lymphocytes 13.3 (L) 28 - 48 %    Monocytes 14.8 (H) 1 - 13 %    Eosinophils 5.8 (H) 0 - 5 %    Basophils 0.4 0 - 3 %   Lactate, Sepsis    Collection Time: 08/09/22 11:00 PM   Result Value Ref Range    Lactate 1.0 0.5 - 2.2 mmol/L   Procalcitonin    Collection Time: 08/09/22 11:00 PM   Result Value Ref Range    Procalcitonin 0.06 0.00 - 0.50 ng/ml   Troponin    Collection Time: 08/09/22 11:00 PM   Result Value Ref Range    Troponin, High Sensitivity 11 0 - 34 ng/L        Imaging:  (All imaging personally reviewed and read by myself in conjunction with Dr. Jeanie Miller, Jone Neither, MD)  My interpretation is No head bleed, no abscess per my read.  Final overread will be done by the attending radiologist.    CT ABDOMEN PELVIS W  IV CONTRAST Additional Contrast? Radiologist Recommendation    Result Date: 08/10/2022  EXAMINATION: CT ABDOMEN PELVIS W IV CONTRAST CLINICAL INDICATION: fever, sacral decub ulcer, hx osteomyelitis COMPARISON:  None TECHNIQUE:  All CT exams at this facility use one or more dose reduction techniques including automatic exposure control, ma/kV  adjustment per patient's size, or iterative reconstruction technique. Multiple contiguous axial CT images at 5  mm increments were obtained from the bases of the lungs to the  pubic symphysis with intravenous contrast. FINDINGS: Visualized lungs/chest: Small bilateral pleural effusions. Left upper lobe lung nodule 7 x 5 mm. Right middle lobe lung nodule 8 x 7 mm. Hepatobiliary: Unremarkable. Gallbladder normal. Kidneys/ureters: Unremarkable. Esophagus/Stomach: Unremarkable. Adrenal glands: Unremarkable. Pancreas: Unremarkable. Spleen: Unremarkable. Vascular  structures:  Unremarkable. Peritoneum/mesentery/bowel:  Unremarkable. Retroperitoneal lymph nodes: Unremarkable Pelvis:  Appendix normal.  Right adnexal low-density 2.5 x 1.8 cm. Unclear if this represents bowel or ovarian cystic lesion. Bones: Marked disc space narrowing lumbar sacral junction. No acute appearing erosions involving the sacrum or coccyx. Soft tissue thickening with small central ulceration overlying the sacrum and coccyx. No discrete subcutaneous abscess. Miscellaneous: None.     IMPRESSION: 1. Lung nodules. The largest 8 x 7 mm in the right middle lobe. Follow Fleischner's criteria. 2. Right adnexal low-density 2.5 x 1.8 cm. Unclear if this represents bowel or ovarian cystic lesion. This can be followed by nonemergent pelvic ultrasound. 3. Degenerative changes of the lumbar sacral spine. No acute appearing erosions to suggest osteomyelitis. Thickened overlying soft tissue with small central ulceration. Please correlate with direct inspection. Please follow Fleischner Society recommendations and correlate clinically. These recommendations have been recently updated. FLEISCHNER SOCIETY RECOMMENDATIONS, INCIDENTAL PULMONARY SOLID NODULE  AND SUBSOLID NODULES BASED ON SIZE: Nodule size (millimeters)  SOLID NODULES Nodule Size:  < 6 mm Low-Risk Patient:  No CT followup needed. High-Risk Patient: Optional CT follow-up at 12 months Low-Risk Patient with multiple nodules: No CT follow-up needed. High-Risk Patient with multiple nodules: Optional CT follow-up at 12 months. Nodule Size: 6-8 mm Low-Risk Patient: CT at 6-12 months, then consider CT at 18-24 months. High-Risk Patient:  Initial followup at 6-12 months and then at 18-24 months if no change. Low-Risk Patient with multiple nodules: CT at 3-6 months, then consider CT at 18-24 months if no change. High-Risk Patient with multiple nodules: CT at 3-6 months, then at 18-24 months if no change. Nodule Size:  >8 mm Low-Risk Patient: Consider CT, PET/CT, or  tissue sampling at 3 months. High-Risk Patient:  Consider CT, PET/CT, or tissue sampling at 3 months. Low-Risk Patient with multiple nodules: CT at 3-6 months, then consider CT at 18-24 months if no change. High-Risk Patient with multiple nodules: CT at 3-6 months, then at 18-24 months if no change. SUBSOLID NODULES: Nodule Size:  < 6 mm Groundglass: No CT followup needed. Part solid: No CT followup needed. Multiple nodules: CT at 3-6 months. If stable, consider CT at 2 and 4 years. Nodule size > 6 mm Groundglass: CT at 6-12 months to confirm presence. Then CT every 2 years until 5 years. Part solid: CT at 3-6 months to confirm presence. If unchanged and solid component remains less than 6 mm, annual CT should be performed for 5 years. Multiple nodules: CT at 3-6 months. Subsequent management based on the most suspicious nodule. PLEASE NOTE DISTINCTION BETWEEN SOLID AND SUBSOLID NODULES. Guidelines for Management of Incidental Pulmonary Nodules Detected on CT Images: From the Fleischner Society 2017 Electronically signed by: Asenath Blacker, MD 08/10/2022  2:09 AM EST          Workstation ID: [id number]     CT Head W/O Contrast    Result Date: 08/10/2022  Examination: CT brain noncontrast Clinical indication: AMS. Comparison: None Findings: Multiple contiguous axial CT images of the brain were obtained from the vertex of the skull to the skull base with no contrast. All CT exams at this facility use one or more dose reduction techniques including automatic exposure control, ma/kV  adjustment per patient's size, or iterative reconstruction technique. Paranasal sinuses well aerated.  Mastoids, normal. No hemorrhage, masses, or midline shift. No extra-axial fluid collections. Small chronic infarction left corona radiata. Chronic lacunar infarction left cerebellum. Chronic appearing lacunar infarction central pons. No hydrocephalus. Heavy calcification anterior falx. Periventricular areas of low attenuation.   Moderate  frontotemporal atrophy. Subinsular low densities. Osseous structures intact. Skullbase and orbits grossly intact. Craniocervical junction is intact.     Impression: Chronic appearing lacunar infarctions central pons. Chronic infarctions left cerebellum and left corona radiata. Chronic microvascular ischemic changes. Electronically signed by: Asenath Blacker, MD 08/10/2022 1:36 AM EST          Workstation ID: [id number]     XR CHEST PORTABLE    Result Date: 08/09/2022  Clinical history: Sepsis, cough EXAMINATION: Single view of the chest 08/09/2022 FINDINGS: Trachea and heart size are within normal limits. Lungs are clear. Anterior cervical fusion. Tip of the right approximately PICC line projects in the SVC.     IMPRESSION: No acute pulmonary process. Electronically signed by: Adolph Aid, MD 08/09/2022 9:19 PM EST          Workstation ID: [id number]       Telemetry rhythm strip: (Personally reviewed by myself and Dr. Jeanie Miller, Jone Neither, MD) my interpretation is SR 60    EKG: (EKG personally reviewed and read by myself in conjunction with Dr. Jeanie Miller, Jone Neither, MD who performed final read) My interpretation is Normal sinus rhythm 61 bpm per my read.      ED Course     Patient Vitals for the past 24 hrs:   BP Temp Pulse Resp SpO2 Height Weight   08/10/22 0401 (!) 167/62 -- 58 14 97 % -- --   08/10/22 0331 (!) 195/81 -- 59 13 98 % -- --   08/10/22 0327 (!) 185/74 -- 60 13 99 % -- --   08/10/22 0301 (!) 198/77 -- 71 14 99 % -- --   08/10/22 0202 (!) 237/81 -- 66 12 99 % -- --   08/10/22 0117 (!) 213/77 -- 69 17 99 % -- --   08/09/22 2104 -- -- 72 20 99 % -- --   08/09/22 2059 (!) 204/87 -- -- -- 100 % -- --   08/09/22 2040 (!) 193/75 98.7 F (37.1 C) 70 20 100 % 1.702 m (5\' 7" ) 71.7 kg (158 lb)       ED Course as of 08/10/22 0548   Sat Aug 09, 2022   2117 XR CHEST PORTABLE  I personally reviewed and read the radiograph and my interpretation is as follows: R-sided perihilar increased density, may reflect infiltrate [SH]    Sun Aug 10, 2022   0226 CT ABDOMEN PELVIS W IV CONTRAST Additional Contrast? Radiologist Recommendation  Reviewed.  No acute surgical pathology [SH]      ED Course User Index  [SH] Bernita Bristle, PA-C       Medications   carvedilol  (COREG ) tablet 6.25 mg ( Oral Canceled Entry  08/10/22 0451)   iopamidol  (ISOVUE -300) 61 % injection 85 mL (85 mLs IntraVENous Given 08/10/22 0105)   potassium chloride  (KLOR-CON  M) extended release tablet 40 mEq (40 mEq Oral Given 08/10/22 0239)   cloNIDine  (CATAPRES ) tablet 0.2 mg (0.2 mg Oral Given 08/10/22 0239)   vancomycin  (VANCOCIN ) 1750 mg in sodium chloride  0.9 % 500 mL IVPB (0 mg IntraVENous Stopped 08/10/22 0451)   piperacillin -tazobactam (ZOSYN ) 4,500 mg in sodium chloride  0.9 % 100 mL IVPB (mini-bag) (0 mg IntraVENous Stopped 08/10/22 0309)       Procedures      Severe exacerbation or progression of chronic illness: sacral wound    Threat to body function without evaluation and management: skin, pulmonary, life and limb    SOCIAL DETERMINANTS impacting Evaluation and Management:     -  Access to provider given after hours of operation of offices.      -  Patient's care has been impacted by health system's issues including ED overcrowding, boarding, and functioning over capacity.    -  health literacy    -  health support in facility    Comorbidities impacting Evaluation and Management: dm, htn, hx cva    Medical Decision Making   NARRATIVE:  67 y.o. female who presents to the ED with cc of fever for the last 2 days who is currently getting IV antibiotics for her a sacral wound infection.  She does not appear to be septic. I have spoken to Tyson Gals, MD regarding this patient's care and we discussed patient's history, clinical presentation, physical exam, and my initial diagnosis and plan for care management.  CT of abdomen and pelvis did not show any evidence for abscess.  CT of head did not show any intracranial bleeding.  Chest x-ray was read as no acute pulmonary process.   Patient's viral swab came back positive for COVID-19.  Blood pressure was rather elevated.  She was given medications to address her blood pressure. I considered the following testing, treatment, or disposition: Paxlovid but decided not to pursue due to the fact that her daughter tells me that she was diagnosed at the beginning of December with COVID.  I am not sure if this positive COVID swab today reflects a new infection or simply a persistent COVID PCR.  Patient was given doses of her IV antibiotics while she was here in the ER as she was due.  She is got yet 1 more day left of them.  I do not think that she had her blood pressure medications prior to being taken from the facility she was at as well, her blood pressure came down nicely with interventions.  Patient remained stable in the emergency room, I do not feel that she requires inpatient hospitalization.  She is advised to follow-up with outpatient specialist and primary care doctor.    Final Diagnosis     Final diagnoses:   COVID-19   Sacral osteomyelitis (HCC)   Decubitus ulcer of sacral region, stage 4 (HCC)   Hypertensive urgency          Disposition   Discharge       Medication List        ASK your doctor about these medications      acetaminophen  500 MG tablet  Commonly known as: TYLENOL   Take 1 tablet by mouth every 6 hours as needed for Fever or Pain     aspirin  325 MG tablet     atorvastatin  80 MG tablet  Commonly  known as: LIPITOR   Take 1 tablet by mouth nightly     baclofen  10 MG tablet  Commonly known as: LIORESAL      carvedilol  6.25 MG tablet  Commonly known as: COREG   Take 1 tablet by mouth 2 times daily (with meals)     famotidine  20 MG tablet  Commonly known as: PEPCID   Take 1 tablet by mouth daily     ferrous sulfate  325 (65 Fe) MG tablet  Commonly known as: IRON  325  Take 1 tablet by mouth daily     furosemide  40 MG tablet  Commonly known as: LASIX      insulin  glargine 100 UNIT/ML injection vial  Commonly known as: LANTUS   Inject 30 Units  into the skin daily     * insulin  lispro 100 UNIT/ML Soln injection vial  Commonly known as: HUMALOG   Sliding Scale insulin  as per Institutional protocol     * insulin  lispro 100 UNIT/ML Soln injection vial  Commonly known as: HUMALOG   Inject 7 Units into the skin 3 times daily (with meals)     lactobacillus capsule  Take 1 capsule by mouth daily (with breakfast)     Magnesium  Oxide 400 MG Caps     therapeutic multivitamin-minerals tablet  Take 1 tablet by mouth daily           * This list has 2 medication(s) that are the same as other medications prescribed for you. Read the directions carefully, and ask your doctor or other care provider to review them with you.                  Type(s) of Personal Protection Equipment (PPE) worn during exam:  gloves, mask    Bernita Bristle, MPA, PA-C  08/09/2022  5:48 AM    The patient was personally evaluated by myself and Dr. Jeanie Miller, Jone Neither, MD who agrees with the above assessment and plan.    My signature above authenticates this document and my orders, the final diagnosis(es), discharge prescription (s), and instructions in the Epic record. If you have any questions please contact [phone].     Nursing notes have been reviewed by the Physician/Advanced Practice Clinician. Dragon medical dictation software was used for portions of this report. Unintended voice recognition errors may occur.               Bernita Bristle, PA-C  08/10/22 [phone]

## 2022-08-09 NOTE — ED Notes (Cosign Needed)
Rad: CT Abd Pelvis  Small bilateral pleural effusions. Left upper lobe lung nodule 7x5 mm. Right middle lobe nodule 8x7 mm.   Results presented to Dr. Veneta Penton who states this was most likely addressed when she was at [facility] but requests I send report to her current residence at [facility] [place]. I called the facility to get the unit fax number but no answer. Will ask tomorrow's staff to call since this is Sunday.     [name] Short  08/10/22 1711

## 2022-08-09 NOTE — ED Notes (Cosign Needed)
Rad  The patient's CT abdomen and pelvis with contrast came back showing the patient has lung nodules the largest is 8 x 7 in the right middle lobe she also is a right adnexal low-density 2.5 x 1.8 cm patient needs follow-up.  We have tried for several days to reach the caregivers at [facility].  We have left messages but received none back.  I called the patient's daughter Roma Schanz.  She said that they were aware of something going on with her lung.  She has a pulmonologist who said they would follow it.  But I did inform her of the size of the nodule and the size of the adnexal nodule.  She thanked [place] for calling     Charlcie Cradle, LPN  29/56/21 [id number]  [employer] librarian returned our call.  She told me that the patient was discharged from the facility and actually lives at home and her daughter Jiles Garter is her caregiver.     Shiela Mayer [place], [place]  [phone]

## 2022-08-09 NOTE — ED Triage Notes (Signed)
Arrived by [employer] from nursing facility on [facility]    Pt daughter requesting patient be evaluated for AMS. Pt complains of leg pain    Pt has a wound vac on her buttock area    Blood sugar 301 mg/dl per EMS

## 2022-08-09 NOTE — ED Notes (Cosign Needed)
Other: Daughter Jiles Garter left VM asking for lab results.   I called daughter back and told her that there are abnorms in electrolytes that need to be addressed by PCP. Blood culture negative. Daughter wanted results of UA, urine culture and pressure ulcer culture. I told her those were not ordered. She feels that these should have been done as her mother was not at baseline when EMS called. EMS was called for confusion and urinary incontinence in her bed at [facility]. Daughter wants her concern reported to my superiors. I have sent such to the appropriate MD.     Ambrose Pancoast  08/16/22 1445

## 2022-08-09 NOTE — Progress Notes (Signed)
Prelim bilateral lower extremity venous Doppler:    No evidence of deep vein thrombosis in the bilateral lower extremities.    Final report to follow.

## 2022-08-10 ENCOUNTER — Emergency Department: Admit: 2022-08-10 | Payer: PRIVATE HEALTH INSURANCE | Primary: Physician Assistant

## 2022-08-10 ENCOUNTER — Inpatient Hospital Stay
Admit: 2022-08-10 | Discharge: 2022-08-10 | Disposition: A | Discharge status: Home / Self Care | Payer: PRIVATE HEALTH INSURANCE | Attending: Emergency Medicine

## 2022-08-10 DIAGNOSIS — U071 COVID-19: Secondary | ICD-10-CM

## 2022-08-10 LAB — COMPREHENSIVE METABOLIC PANEL
ALT: 34 U/L (ref 10–49)
AST: 39 U/L — ABNORMAL HIGH (ref 0.0–33.9)
Albumin: 2.7 g/dL — ABNORMAL LOW (ref 3.4–5.0)
Alkaline Phosphatase: 118 U/L — ABNORMAL HIGH (ref 46–116)
Anion Gap: 7 mmol/L (ref 5–15)
BUN: 16 mg/dL (ref 9–23)
CO2: 31 meq/L (ref 20–31)
Calcium: 10.3 mg/dL (ref 8.7–10.4)
Chloride: 109 meq/L — ABNORMAL HIGH (ref 98–107)
Creatinine: 0.85 mg/dL (ref 0.55–1.02)
GFR African American: >60
GFR Non-African American: >60
Glucose: 232 mg/dL — ABNORMAL HIGH (ref 74–106)
Potassium: 3 meq/L — ABNORMAL LOW (ref 3.5–5.1)
Sodium: 147 meq/L — ABNORMAL HIGH (ref 136–145)
Total Bilirubin: 0.2 mg/dL — ABNORMAL LOW (ref 0.30–1.20)
Total Protein: 6.4 g/dL (ref 5.7–8.2)

## 2022-08-10 LAB — EKG 12-LEAD
Atrial Rate: 61 {beats}/min
Calculated P Axis: 74 degrees
Calculated R Axis: 40 degrees
Calculated T Axis: 61 degrees
DIAGNOSIS, 93000: NORMAL
P-R Interval: 140 ms
Q-T Interval: 424 ms
QRS Duration: 94 ms
QTC Calculation (Bezet): 426 ms
Ventricular Rate: 61 {beats}/min

## 2022-08-10 LAB — CBC WITH AUTO DIFFERENTIAL
Basophils: 0.4 % (ref 0–3)
Eosinophils: 5.8 % — ABNORMAL HIGH (ref 0–5)
Hematocrit: 36.4 % (ref 35.0–47.0)
Hemoglobin: 11.4 g/dL (ref 11.0–16.0)
Immature Granulocytes %: 0.2 % (ref 0.0–3.0)
Lymphocytes: 13.3 % — ABNORMAL LOW (ref 28–48)
MCH: 24.4 pg — ABNORMAL LOW (ref 25.4–34.6)
MCHC: 31.3 g/dL (ref 30.0–36.0)
MCV: 77.8 fL — ABNORMAL LOW (ref 80.0–98.0)
MPV: 9.8 fL (ref 6.0–10.0)
Monocytes: 14.8 % — ABNORMAL HIGH (ref 1–13)
Neutrophils Segmented: 65.5 % — ABNORMAL HIGH (ref 34–64)
Nucleated RBCs: 0 (ref 0–0)
Platelets: 240 10*3/uL (ref 140–450)
RBC: 4.68 M/uL (ref 3.60–5.20)
RDW: 49.1 — ABNORMAL HIGH (ref 36.4–46.3)
WBC: 4.5 10*3/uL (ref 4.0–11.0)

## 2022-08-10 LAB — PROCALCITONIN: Procalcitonin: 0.06 ng/mL (ref 0.00–0.50)

## 2022-08-10 LAB — COVID-19, FLU A/B, AND RSV COMBO
Influenza A H1 (Seasonal) PCR: NEGATIVE
Influenza virus B RNA: NEGATIVE
RSV A/B PCR: NEGATIVE
SARS-CoV-2: POSITIVE — AB

## 2022-08-10 LAB — TROPONIN: Troponin, High Sensitivity: 11 ng/L (ref 0–34)

## 2022-08-10 LAB — LACTATE, SEPSIS: Lactate: 1 mmol/L (ref 0.5–2.2)

## 2022-08-10 MED ORDER — IOPAMIDOL 61 % IV SOLN
61 % | Freq: Once | INTRAVENOUS | Status: AC | PRN
Start: 2022-08-10 — End: 2022-08-10
  Administered 2022-08-10: 06:00:00 85 mL via INTRAVENOUS

## 2022-08-10 MED ORDER — VANCOMYCIN 1750 MG IN NS 500 ML (PREMIX) IVPB
Status: AC
Start: 2022-08-10 — End: 2022-08-10
  Administered 2022-08-10: 08:00:00 1750 mg via INTRAVENOUS

## 2022-08-10 MED ORDER — PIPERACILLIN SOD-TAZOBACTAM SO 4.5 (4-0.5) G IV SOLR
4.5 (4-0.5) g | Freq: Once | INTRAVENOUS | Status: AC
Start: 2022-08-10 — End: 2022-08-10
  Administered 2022-08-10: 08:00:00 4500 mg via INTRAVENOUS

## 2022-08-10 MED ORDER — POTASSIUM CHLORIDE CRYS ER 20 MEQ PO TBCR
20 MEQ | Freq: Once | ORAL | Status: AC
Start: 2022-08-10 — End: 2022-08-10
  Administered 2022-08-10: 08:00:00 40 meq via ORAL

## 2022-08-10 MED ORDER — CLONIDINE HCL 0.1 MG PO TABS
0.1 MG | Freq: Once | ORAL | Status: AC
Start: 2022-08-10 — End: 2022-08-10
  Administered 2022-08-10: 08:00:00 0.2 mg via ORAL

## 2022-08-10 MED ORDER — CARVEDILOL 6.25 MG PO TABS
6.25 MG | ORAL | Status: DC
Start: 2022-08-10 — End: 2022-08-10

## 2022-08-10 MED FILL — POTASSIUM CHLORIDE CRYS ER 20 MEQ PO TBCR: 20 MEQ | ORAL | Qty: 2

## 2022-08-10 MED FILL — VANCOMYCIN 1750 MG IN NS 500 ML (PREMIX) IVPB: Qty: 500

## 2022-08-10 MED FILL — PIPERACILLIN SOD-TAZOBACTAM SO 4.5 (4-0.5) G IV SOLR: 4.5 (4-0.5) g | INTRAVENOUS | Qty: 4500

## 2022-08-10 MED FILL — ISOVUE-300 61 % IV SOLN: 61 % | INTRAVENOUS | Qty: 85

## 2022-08-10 MED FILL — CLONIDINE HCL 0.1 MG PO TABS: 0.1 MG | ORAL | Qty: 2

## 2022-08-10 NOTE — ED Notes (Signed)
Transport arrived to take patient to ct. Patient was not ready to go. She wants to urinate first.     Will call CT when patient is ready     Milagros Reap, RN  08/10/22 0010

## 2022-08-10 NOTE — ED Notes (Signed)
32 Certification Medical Necessity Statement For  Non-Emergency Ambulance Service    Patient: [name] Short Age: 67 y.o. Sex: female    Date of Birth: [birth year] Admit Date: 08/09/2022 PCP: Ignacia Palma, PA   MRN: [id number]  CSN: [id number]  Room: ER10/ER10     Chief Complaint:   Chief Complaint   Patient presents with    Leg Pain    Altered Mental Status      Height/Weight: Body mass index is 24.75 kg/m. Body mass index is 24.75 kg/m.   Weight:     Weight - Scale: 71.7 kg (158 lb)  Weight Source:      Transport From:  [x] Hospital    [] SNF   [] Residence   [] Other:   Transport To:      [] Hospital    [x] SNF   [] Residence   [] Other:   Address:        Medicare: [place])  Transport Date: [name] 7, 2024 (Valid for round trips this date, or for scheduled repetitive trips for 60 days from date signed below.)  Origin: [place]: Discharge to nursing home  [place] [phone]    Is the Patient's stay covered under Medicare Part A (PPS/DRG?)  []  Yes    []  No     Closest appropriate facility?  []  Yes    []  No     If no, why was the patient transported to another facility?   If hospital to hospital transfer, describe services needed at 2nd facility not available at 1st facility:   If hospice Pt, is this transport related to Pt's terminal illness? []  Yes    []  No   Describe:     [place]  Ambulance transportation is medically necessary only if other means of transport are contraindicated or would be potentially   Harmful to the patient. To meet this requirement, the patient must be either "bed confined" or suffer from condition such   that transport by means other than an ambulance is contraindicated by the patient's condition.The following questions   must be addressed by the healthcare professional signing below for this form to be valid:    1) Describe the MEDICAL CONDITION space (physical and/or mental) of this patient AT  [place]       that requires the patient to be transported in an ambulance, and why transport in an ambulance, and why transport by       other means is contraindicated by the patient's condition:     2) Is this patient "bed confined" as defined below?  []  Yes    [x]  No           To be "bed confined" the patient must satisfy all 3 of the following criteria:           (1) unable to get up from bed without assistance; AND           (2) unable to ambulate; AND           (3) unable to sit in a chair or wheelchair.    3) Can this patient safely be transported by car or wheelchair van (I.e., may safely sit during transport, without an attendant or monitoring?)      [x]  Yes    []  No    4) In addition to completing questions 1-3 above, please check any of the following conditions that apply*:       *  Note: Supporting documentation for any boxes checked must be maintained in the patient's medical records    Patient requires ambulance transportation due to the following condition:  [] Airway control or positioning required en route  [] Altered Mental Status, Etiology:   [] Amputation of lower extremity, Site:    (BKA / AKA / Other)  [] Asphyxia or Hypoxemia, Etiology:   [] Cancer, Site:   [] Cardiac or Hemodynamic Monitoring required en route  [] Cerebrovascular disease, WITH: Cognitive Defects  [] Cerebrovascular disease, WITH: Hemiparesis / Hemiplegia  [] Cerebrovascular disease, WITH: Monoplegia of a lower limb  [] Chest wall injury  [] Contractures of extremities, Site:   [x] Decubitus ulcer, Site: saccum   [] Fracture, Site:   [] Head Injury  [] IV Fluid Management, Medications being administered:   [x] PIV:   []  Yes    []  No  [] Oxygen required, patient cannot self-administer, Reason:   [] Oxygen requiring titrated therapy en route  [] Patient Safety: Danger to self or others - flight risk / monitoring  [] Patient Safety: Risk of falling off wheelchair or stretcher while in motion  [] Restraints required:  [] Verbal       [] Physical    [] Chemical  [] Special handling en route to reduce pain  [] Special handling en route for patient positioning  [] Special handling en route for Isolation, Type:     Diagnosis:   [] Suctioning required en route  [] Torso or Trunk injury  [] Ventilator Dependent  [] Other    SECTION III - SIGNATURE OF PHYSICIAN OR OTHER AUTHORIZED HEALTHCARE PROFESSIONAL  I certify that the above information is accurate based on my evaluation of this patient, and that the medical necessity provisions   of 42 CFR 410.40 (e)(1) are met, requiring that this patient be transported by ambulance.  I understand this information will be used by   the centers for Medicare and Medicaid Services (CMS) to support the determination of medical necessity for ambulance services.  I represent   that I am the beneficiary's attending physician; or an employee of the beneficiary's attending physician, or the hospital or facility where the   beneficiary is being treated and from which the beneficiary is being transported; that I have personal knowledge of the beneficiary's   condition at the time of transport; and that I meet all Medicare regulations and applicable state licensure laws for the credentialed indicated.    [] If this box is checked, I also certify that the patient is physically or mentally incapable of signing the ambulance services claim form and   that the institution with which I am affiliated has furnished care, services or assistance to the patient.  My signature below was made on behalf   of the patient pursuant to 61 ZOX096.04(V)(4).  In accordance with 42 [id number], the specific reason(s) that the patient is physically   or mentally incapable of signing the claim form is as follows.    Electronically signed by:  Milagros Reap, RN  [name] 7, 2024  5:50 AM    Name: _________________________Title: ______________________  Facility: [facility]  Phone Number: [phone]  Date: [name] 7, 2024     Please call to arrange  transportation: [phone]  [email or web address]       Milagros Reap, RN  08/10/22 [phone]

## 2022-08-10 NOTE — Discharge Instructions (Addendum)
Take all medications as prescribed   Follow up with all specialist and PCP     Recent Results (from the past 24 hour(s))   COVID-19, Flu A/B, and RSV Combo    Collection Time: 08/09/22  9:04 PM    Specimen: Nasopharyngeal Swab   Result Value Ref Range    SARS-CoV-2 POSITIVE (A) NEGATIVE      Influenza A H1 (Seasonal) PCR NEGATIVE NEGATIVE      Influenza virus B RNA NEGATIVE NEGATIVE      RSV A/B PCR NEGATIVE NEGATIVE     Comprehensive Metabolic Panel    Collection Time: 08/09/22 11:00 PM   Result Value Ref Range    Potassium 3.0 (L) 3.5 - 5.1 mEq/L    Chloride 109 (H) 98 - 107 mEq/L    Sodium 147 (H) 136 - 145 mEq/L    CO2 31 20 - 31 mEq/L    Glucose 232 (H) 74 - 106 mg/dl    BUN 16 9 - 23 mg/dl    Creatinine 6.64 4.03 - 1.02 mg/dl    GFR African American >60.0      GFR Non-African American >60      Calcium 10.3 8.7 - 10.4 mg/dl    Anion Gap 7 5 - 15 mmol/L    AST 39.0 (H) 0.0 - 33.9 U/L    ALT 34 10 - 49 U/L    Alkaline Phosphatase 118 (H) 46 - 116 U/L    Total Bilirubin 0.20 (L) 0.30 - 1.20 mg/dl    Total Protein 6.4 5.7 - 8.2 gm/dl    Albumin 2.7 (L) 3.4 - 5.0 gm/dl   CBC with Auto Differential    Collection Time: 08/09/22 11:00 PM   Result Value Ref Range    WBC 4.5 4.0 - 11.0 1000/mm3    RBC 4.68 3.60 - 5.20 M/uL    Hemoglobin 11.4 11.0 - 16.0 gm/dl    Hematocrit 47.4 25.9 - 47.0 %    MCV 77.8 (L) 80.0 - 98.0 fL    MCH 24.4 (L) 25.4 - 34.6 pg    MCHC 31.3 30.0 - 36.0 gm/dl    Platelets 563 875 - 450 1000/mm3    MPV 9.8 6.0 - 10.0 fL    RDW 49.1 (H) 36.4 - 46.3      Nucleated RBCs 0 0 - 0      Immature Granulocytes 0.2 0.0 - 3.0 %    Neutrophils Segmented 65.5 (H) 34 - 64 %    Lymphocytes 13.3 (L) 28 - 48 %    Monocytes 14.8 (H) 1 - 13 %    Eosinophils 5.8 (H) 0 - 5 %    Basophils 0.4 0 - 3 %   Lactate, Sepsis    Collection Time: 08/09/22 11:00 PM   Result Value Ref Range    Lactate 1.0 0.5 - 2.2 mmol/L   Procalcitonin    Collection Time: 08/09/22 11:00 PM   Result Value Ref Range    Procalcitonin 0.06 0.00 -  0.50 ng/ml   Troponin    Collection Time: 08/09/22 11:00 PM   Result Value Ref Range    Troponin, High Sensitivity 11 0 - 34 ng/L        CT ABDOMEN PELVIS W IV CONTRAST Additional Contrast? Radiologist Recommendation    Result Date: 08/10/2022  EXAMINATION: CT ABDOMEN PELVIS W IV CONTRAST CLINICAL INDICATION: fever, sacral decub ulcer, hx osteomyelitis COMPARISON:  None TECHNIQUE:  All CT exams at this facility use one or  more dose reduction techniques including automatic exposure control, ma/kV  adjustment per patient's size, or iterative reconstruction technique. Multiple contiguous axial CT images at 5  mm increments were obtained from the bases of the lungs to the  pubic symphysis with intravenous contrast. FINDINGS: Visualized lungs/chest: Small bilateral pleural effusions. Left upper lobe lung nodule 7 x 5 mm. Right middle lobe lung nodule 8 x 7 mm. Hepatobiliary: Unremarkable. Gallbladder normal. Kidneys/ureters: Unremarkable. Esophagus/Stomach: Unremarkable. Adrenal glands: Unremarkable. Pancreas: Unremarkable. Spleen: Unremarkable. Vascular structures:  Unremarkable. Peritoneum/mesentery/bowel:  Unremarkable. Retroperitoneal lymph nodes: Unremarkable Pelvis:  Appendix normal.  Right adnexal low-density 2.5 x 1.8 cm. Unclear if this represents bowel or ovarian cystic lesion. Bones: Marked disc space narrowing lumbar sacral junction. No acute appearing erosions involving the sacrum or coccyx. Soft tissue thickening with small central ulceration overlying the sacrum and coccyx. No discrete subcutaneous abscess. Miscellaneous: None.     IMPRESSION: 1. Lung nodules. The largest 8 x 7 mm in the right middle lobe. Follow Fleischner's criteria. 2. Right adnexal low-density 2.5 x 1.8 cm. Unclear if this represents bowel or ovarian cystic lesion. This can be followed by nonemergent pelvic ultrasound. 3. Degenerative changes of the lumbar sacral spine. No acute appearing erosions to suggest osteomyelitis. Thickened  overlying soft tissue with small central ulceration. Please correlate with direct inspection. Please follow Fleischner Society recommendations and correlate clinically. These recommendations have been recently updated. FLEISCHNER SOCIETY RECOMMENDATIONS, INCIDENTAL PULMONARY SOLID NODULE  AND SUBSOLID NODULES BASED ON SIZE: Nodule size (millimeters)  SOLID NODULES Nodule Size:  < 6 mm Low-Risk Patient:  No CT followup needed. High-Risk Patient: Optional CT follow-up at 12 months Low-Risk Patient with multiple nodules: No CT follow-up needed. High-Risk Patient with multiple nodules: Optional CT follow-up at 12 months. Nodule Size: 6-8 mm Low-Risk Patient: CT at 6-12 months, then consider CT at 18-24 months. High-Risk Patient:  Initial followup at 6-12 months and then at 18-24 months if no change. Low-Risk Patient with multiple nodules: CT at 3-6 months, then consider CT at 18-24 months if no change. High-Risk Patient with multiple nodules: CT at 3-6 months, then at 18-24 months if no change. Nodule Size:  >8 mm Low-Risk Patient: Consider CT, PET/CT, or tissue sampling at 3 months. High-Risk Patient:  Consider CT, PET/CT, or tissue sampling at 3 months. Low-Risk Patient with multiple nodules: CT at 3-6 months, then consider CT at 18-24 months if no change. High-Risk Patient with multiple nodules: CT at 3-6 months, then at 18-24 months if no change. SUBSOLID NODULES: Nodule Size:  < 6 mm Groundglass: No CT followup needed. Part solid: No CT followup needed. Multiple nodules: CT at 3-6 months. If stable, consider CT at 2 and 4 years. Nodule size > 6 mm Groundglass: CT at 6-12 months to confirm presence. Then CT every 2 years until 5 years. Part solid: CT at 3-6 months to confirm presence. If unchanged and solid component remains less than 6 mm, annual CT should be performed for 5 years. Multiple nodules: CT at 3-6 months. Subsequent management based on the most suspicious nodule. PLEASE NOTE DISTINCTION BETWEEN SOLID  AND SUBSOLID NODULES. Guidelines for Management of Incidental Pulmonary Nodules Detected on CT Images: From the Fleischner Society 2017 Electronically signed by: Javier Glazier, MD 08/10/2022 2:09 AM EST          Workstation ID: [id number]     CT Head W/O Contrast    Result Date: 08/10/2022  Examination: CT brain noncontrast Clinical indication: AMS. Comparison: None Findings: Multiple contiguous  axial CT images of the brain were obtained from the vertex of the skull to the skull base with no contrast. All CT exams at this facility use one or more dose reduction techniques including automatic exposure control, ma/kV  adjustment per patient's size, or iterative reconstruction technique. Paranasal sinuses well aerated.  Mastoids, normal. No hemorrhage, masses, or midline shift. No extra-axial fluid collections. Small chronic infarction left corona radiata. Chronic lacunar infarction left cerebellum. Chronic appearing lacunar infarction central pons. No hydrocephalus. Heavy calcification anterior falx. Periventricular areas of low attenuation.   Moderate frontotemporal atrophy. Subinsular low densities. Osseous structures intact. Skullbase and orbits grossly intact. Craniocervical junction is intact.     Impression: Chronic appearing lacunar infarctions central pons. Chronic infarctions left cerebellum and left corona radiata. Chronic microvascular ischemic changes. Electronically signed by: Javier Glazier, MD 08/10/2022 1:36 AM EST          Workstation ID: [id number]     XR CHEST PORTABLE    Result Date: 08/09/2022  Clinical history: Sepsis, cough EXAMINATION: Single view of the chest 08/09/2022 FINDINGS: Trachea and heart size are within normal limits. Lungs are clear. Anterior cervical fusion. Tip of the right approximately PICC line projects in the SVC.     IMPRESSION: No acute pulmonary process. Electronically signed by: Kathlene Cote, MD 08/09/2022 9:19 PM EST          Workstation ID: [id number]

## 2022-08-10 NOTE — ED Notes (Signed)
Patient leaving with fast track      Milagros Reap, RN  08/10/22 [phone]

## 2022-08-10 NOTE — ED Notes (Signed)
Patient brief and sheet changed.     Placed on external catheter per patient and family request.          Milagros Reap, RN  08/10/22 0011

## 2022-08-13 ENCOUNTER — Encounter: Admit: 2022-08-13 | Discharge: 2022-08-13 | Payer: PRIVATE HEALTH INSURANCE | Primary: Physician Assistant

## 2022-08-13 NOTE — Home Health (Signed)
Skilled services/Home bound verification:      Skilled Reason for admission/summary of clinical condition:  67 y.o. female who presents to the [facility] with past medical history of diabetes, hypertension, history of DVT, history of stroke and sleep apnea who presents to the emergency room tonight via EMS from [place] [facility] and rehab for evaluation of bilateral lower extremity pain and fever. PMH: DM, HTN, Cervical Myelopathy, Hypercholesterolemia, CVA, Osteoarthritis, Sleep Apnea. Pt has sacral wound; wound vac 3x/week.    This patient is homebound for the following reasons Requires considerable and taxing effort to leave the home  and Only leaves the home for medical reasons or religious services and are infrequent and of short duration for other reasons.    Skilled care: Teaching disease and medication management, completed assessment, performed sacral wound care (see wound addendum). explained [place] admission packet, discussed POC, SNV freq, PT, OT eval and d/c plan.     Patient response to procedure performed:  Pt tolerated well during the assessment procedure with no c/o.    Caregiver: Primary caregiver/daughter is available to assist with daily meals, ADL's, transfer, administer with daily medications, run errands and accompany to MD appt prn.  Medications reconciled and all medications listed are available in the home this visit. Discussed taking daily prescribed med's on timely basis and taking with food.        The following education was provided regarding medications: Aspirin s/e: abdominal cramps, bloody or black and tarry stool, bloody/dark urine, chest pain discomfort, confusion and constipation. I Discussed Lantus s/e: fast heart beat, blurred vision, dizziness, hunger and low BG.Geralyn Corwin MD if any of these s/s present.  Medications are somewhat effective at this time.      High risk medication teaching regarding anticoagulants, hyperglycemic agents or opoid narcotics performed (specify): Discussed  s/e with anticoagulant and hyperglycemic as mentioned above.    MD notified of any discrepancies/look a like medications/medication interactions N/A    Home health supplies by type and quantity ordered/delivered this visit include: Has available supplies at home.    Patient education provided this visit to include:  Discussed intervention to prevent infection; hand washing or using hand sanitizer when touching contaminated items and avoiding sick person. Repositioning q 2 hrs, using cushion donut. Encouraged to ambulate q 2hrs as tolerated, safety and fall prec; clearing walkway, avoid getting up too quickly when feeling weak, dizzy, and to call for assistance prn. Pacing activity/energy conservation; rest in bet activity and deep breathing exercises. CG to monitor wound vac malfunction, reinforce dressing with air leak and call [facility] for assistance prn. Continue to follow  ADA and heart healthy diet.  Monitor for S/S of infection; fever 100.4, increase pain, redness, swelling, coughing with yellow thick sputum, cloudy urine with foul smell, purulent wound drainage with foul smell, not feeling well 2-3 days, SOB, and to call [facility] or MD for assistance if experiencing any of these S/S. To call 911 with chest pains, facial drooping, difficulty talking, non arousable/unconscious and uncontrollable bleeding.   Patient level of understanding of education provided: Pt/CG has good understanding of the teaching provided during visit.    Sharps education provided: Reviewed teaching how to dispose sharp needles, may obtain sharps containers from medical supplies or pharmacies, may use laundry detergent or liquid softener container, close lid with a tight lid, needles should not be able to poke through puncture the lid. Close lid tight and tape when container gets 2/3 full and label " SHARP Needles Do Not Recycle".  Home exercise program/Homework provided: Ambulation, HEP deep breathing exercises 10x when having SOB, pain and  anxiety, IS 10x q 1-2 hrs.   Pt/Caregiver instructed on plan of care and are agreeable to plan of care at this time.  Physician Ignacia Palma, PA notified of patient admission to home health 08/13/22 and plan of care including anticipated frequency of 2w1, 1w8, 2 prn and treatments/interventions/modalities of teaching disease and medication management, sacral wound care 3x/week.    Discharge planning discussed with patient and caregiver.  Discharge planning as follows: Pt/CG will be able to manage disease and medication independently, and health condition stable Pt/Caregiver did verbalize understanding of discharge planning.   Next MD 10/22/2022 1:45 PM Nestor Ramp, DO Patient/caregiver encouraged/instructed to keep appointment as lack of follow through with physician appointment could result in discontinuation of service.

## 2022-08-14 ENCOUNTER — Encounter: Admit: 2022-08-14 | Discharge: 2022-08-14 | Payer: PRIVATE HEALTH INSURANCE | Primary: Physician Assistant

## 2022-08-14 NOTE — Home Health (Signed)
PMHx: DM   HTN  Cervical Myelopathy  Hypercholesterolemia  CVA   Osteoarthritis  Sleep Apnea  SUBJECTIVE:Pt reports she's been having ongoing issues with her wound on her buttocks and been in and out of the hospital. States she is doing better but continues to feel weak and having ongoing generalized pain  LIVING SITUATION: Lives with daughter and small dog in a very cluttered home with 4 steps to enter.   REQUIRES CAREGIVER ASSISTANCE FOR: Pts daughter assists with meals, medications, ADL's and transportation to appointments  PLOF: Min/Mod A with ADL's.   MEDICATIONS REVIEWED AND UPDATED: no changes noted  NEXT MD APPT: TBD  ROM:B LE's WNL  STRENGTH: R quads 3/5, R hams 3+/5, L LE 3+/5  WOUNDS:Wound vac in place and running to pateint sacral area, No signs of leakage noted  BED MOBILITY:sleeps on couch, unable to get to bedroom d/t clutter  TRANSFERS:CGA/Min A with sit to stand from raised couch  GAIT: X69ft with FWRW with CGA.  Gait characterized by slow short shuffling gait  STAIRS:NT  BALANCE: Pt scored 8/28 on [employer]etti Balance Assessment placing patient at high risk for falls.   PATIENT EDUCATION PROVIDED THIS VISIT: safety, HEP, walking, deep breathing, pressure relief  PATIENT RESPONSE TO EDUCATION: Pt needs ongoing reinforcement of pressure relief strategies and daughter needs ongoing encouragement to declutter home for clear pathways       HEP consisting of:  1. Standing every hour during the day with FWRW   2. Changing positions every hour for pressure relief  3. Deep breathing   Written instructions issued.  Patient/caregiver verbalized understanding.   PATIENT RESPONSE TO TREATMENT: No c/o increased pain during assessment. Pt reports fatigue after assessment  CONTINUED NEED FOR THE FOLLOWING SKILLS: HH PT is medically necessary to address pain, decreased strength, increased swelling, impaired bed mobility, decreased independence with functional transfers, impaired gait, impaired stair negotiation, and  impaired balance in order to improve functional independence, quality of life, return to PLOF, and reduce the risk for falls.  PLAN: PT 1W1, 2W8 for B LE strengthening, transfer training, gait training and stair training to improve pts safe mobility in the home and ability to safely egress home for medical appointments    DISCHARGE PLANNING DISCUSSED: Discharge to self and family under MD supervision once all goals have been met or patient has reached max potential.

## 2022-08-15 LAB — CULTURE, BLOOD 2: BLOOD CULTURE RESULT: NO GROWTH

## 2022-08-15 LAB — CULTURE, BLOOD 1: BLOOD CULTURE RESULT: NO GROWTH

## 2022-08-16 ENCOUNTER — Encounter: Admit: 2022-08-16 | Discharge: 2022-08-16 | Payer: PRIVATE HEALTH INSURANCE | Primary: Physician Assistant

## 2022-08-16 NOTE — Home Health (Signed)
 Skilled reason for visit: Pt assessment and wound care    Caregiver involvement: Pt resides in a single story home with her daughter, pt amb with use of a walker and requires asst with all ADL's and medications, a PCA comes in during the week intermittently when the agency has someone available and the daughter is the primary CG and assts with transportation to appts and is available 24/7 to asst as needed.    This visit: Pt is A&Ox4 with some periods of forgetfulness, able to communicate her needs, denies pain or discomfort at this time, SN provided wound care to pts sacral wound as ordered, wound vac in progress, pt continues to tolerate well, wound remains stable, no s/s of infxn or delay in progress noted, CG had stepped out at the start of this visit and SN was left to asst pt with toileting and positioning in between wound care which prolonged this visit.    Home health supplies by type and quantity ordered/delivered this visit include: N/A    Medications reviewed and all medications are available in the home this visit.    MD notified of any discrepancies/look a-like medications/medication interactions: NA    Medications are effective at this time.     Patient education provided this visit: SEE INTERVENTIONS LIST    Pt advised not to start, stop or change the way she is currently taking any of her medications without first consulting with the MD.    SN instructed pt to promptly report any redness, swelling, warmth, fever, chills, increased heart/respiratory rate, uncontrolled pain/tenderness, drainage: green/yellow/brown, or odor to MD immediately. No s/s of infection noted this visit no odor or foul drainage noted.      SN instructed pt to notify home health or physician for the following wound healing complications: increased drainage, pus or a foul odor coming from the wound, fever, muscle, joint, or body aches, sweating, increased swelling, redness or red streaks surrounding the wound; dramatic change in  color or size of wound; increase in pain at wound site in spite of interventions.    SN advised pt during shower/bath time to assess skin for evidence of pressure injury such as: non-blanchable erythema, blistering, discoloration, alteration of oral mucosa: signs of mucositis, yeast infection, or breaks in membrane, edema, rash and skin tears.  Report location, size, and severity of new or worsening findings to physician.    Agency Progress toward goals: progressing     Patient's Progress towards personal goals: progressing    Pt acknowledged understanding of all education provided.    Patient's Progress towards personal goals: when patient reaches goals and medication is managed, disease processes are understood patient agrees and understand that discharge will take place.

## 2022-08-17 ENCOUNTER — Emergency Department: Admit: 2022-08-18 | Payer: PRIVATE HEALTH INSURANCE | Primary: Physician Assistant

## 2022-08-17 ENCOUNTER — Inpatient Hospital Stay
Admit: 2022-08-17 | Discharge: 2022-08-18 | Disposition: A | Discharge status: Home / Self Care | Payer: PRIVATE HEALTH INSURANCE | Attending: Emergency Medicine

## 2022-08-17 DIAGNOSIS — R5383 Other fatigue: Secondary | ICD-10-CM

## 2022-08-17 NOTE — ED Provider Notes (Signed)
EMERGENCY DEPARTMENT HISTORY AND PHYSICAL EXAM    7:06 PM EST seen at this time in room 12        Date: 08/17/2022  Patient Name: [name] Short    History of Presenting Illness     Chief Complaint   Patient presents with    Dysuria    Altered Mental Status    Urinary Frequency         History Provided By: patient    Additional History (Context): Zachary Lovins is a 67 y.o. female presents with significant for diabetes and cervical spine problems implanted stimulator, had developed a left pelvis wound on her posterior that has a wound VAC.  She comes in with 1 week of dysuria and weakness, some vague lightheadedness when she gets up.  No pain including no abdominal pain no sweats no chest pain or shortness of breath.  Minimal distress at the time my first exam..    PCP: Will Bonnet, PA    Chief Complaint:   Duration:    Timing:    Location:   Quality:   Severity:   Modifying Factors:   Associated Symptoms:       No current facility-administered medications for this encounter.     Current Outpatient Medications   Medication Sig Dispense Refill    insulin glargine (LANTUS) 100 UNIT/ML injection vial Inject 30 Units into the skin daily 1 each 2    famotidine (PEPCID) 20 MG tablet Take 1 tablet by mouth daily 60 tablet 3    insulin lispro (HUMALOG) 100 UNIT/ML SOLN injection vial Inject 7 Units into the skin 3 times daily (with meals) 1 each 0    lactobacillus (CULTURELLE) capsule Take 1 capsule by mouth daily (with breakfast) 60 capsule 0    carvedilol (COREG) 6.25 MG tablet Take 1 tablet by mouth 2 times daily (with meals) 60 tablet 3    baclofen (LIORESAL) 10 MG tablet Take 1 tablet by mouth 3 times daily as needed (PAIN) Indications: Pain FOR PAIN PER SHETH      Magnesium Oxide 400 MG CAPS Take 1 capsule by mouth daily.      aspirin 325 MG tablet Take 1 tablet by mouth daily      acetaminophen (TYLENOL) 500 MG tablet Take 1 tablet by mouth every 6 hours as needed for Fever or Pain 30 tablet 0    insulin lispro  (HUMALOG) 100 UNIT/ML SOLN injection vial Sliding Scale insulin as per Institutional protocol 1 each 0    atorvastatin (LIPITOR) 80 MG tablet Take 1 tablet by mouth nightly 30 tablet 3    ferrous sulfate (IRON 325) 325 (65 Fe) MG tablet Take 1 tablet by mouth daily 30 tablet     Multiple Vitamins-Minerals (THERAPEUTIC MULTIVITAMIN-MINERALS) tablet Take 1 tablet by mouth daily      furosemide (LASIX) 40 MG tablet furosemide 40 mg tablet   TAKE 1 TABLET BY MOUTH EVERY DAY         Past History     Past Medical History:  Past Medical History:   Diagnosis Date    Anemia     Arthritis     Chronic pain     Legs and Shoulders    Diabetes (HCC)     Exposure to asbestos     History of blood transfusion     History of colon polyps     HTN (hypertension)     Hx of blood clots     DVT  Hyperlipemia     Menopause     Pulmonary nodule     Serum calcium elevated     Sleep apnea     not using cpap    Stroke [facility])        Past Surgical History:  Past Surgical History:   Procedure Laterality Date    BACK SURGERY N/A 06/24/2022    INCISION AND DRAINAGE /DEBRIDEMENT SACRAL DECUBITUS performed by Nestor Ramp, DO at [place]    COLONOSCOPY N/A 01/13/2017    COLONOSCOPY performed by Joycelyn Schmid, MD at [place] N/A 12/28/2021    SACRAL WOUND DEBRIDEMENT INCISION AND DRAINAGE performed by Nestor Ramp, DO at [place] N/A 12/09/2021    CERVICAL THREE/FOUR/FIVE/SIX LAMINECTOMY FUSION; C-ARM; STRYKER; EXT BONE GROWTH STIM; 23 HR performed by Candi Leash, MD at [facility] MAIN OR       Family History:  Family History   Problem Relation Age of Onset    Diabetes Other 72        parent, NOS    Cancer Father     Hypertension Other 35        parent,NOS    Heart Disease Other 21        parent, NOS    Lung Disease Father        Social History:  Social History     Tobacco Use    Smoking status: Former     Current  packs/day: 0.00     Types: Cigarettes, Cigars     Quit date: 11/13/2021     Years since quitting: 0.7    Smokeless tobacco: Never   Vaping Use    Vaping Use: Never used   Substance Use Topics    Alcohol use: Not Currently     Comment: occ    Drug use: Never       Allergies:  Allergies   Allergen Reactions    Lactose Other (See Comments)     Intolerance         Review of Systems     Review of Systems      Physical Exam       Patient Vitals for the past 12 hrs:   Temp Pulse Resp BP SpO2   08/17/22 1801 97.5 F (36.4 C) 64 18 (!) 192/65 99 %       IPVITALS  Patient Vitals for the past 24 hrs:   BP Temp Temp src Pulse Resp SpO2   08/17/22 1801 (!) 192/65 97.5 F (36.4 C) Temporal 64 18 99 %       Physical Exam  Constitutional:       General: She is not in acute distress.     Appearance: Normal appearance. She is obese. She is not toxic-appearing.   HENT:      Head: Normocephalic and atraumatic.   Cardiovascular:      Rate and Rhythm: Normal rate and regular rhythm.      Pulses: Normal pulses.      Heart sounds: Normal heart sounds.   Pulmonary:      Effort: Pulmonary effort is normal.      Breath sounds: Normal breath sounds.   Abdominal:      Tenderness: There is no abdominal tenderness.   Musculoskeletal:  General: Normal range of motion.      Cervical back: Normal range of motion and neck supple.      Right lower leg: No edema.      Left lower leg: No edema.      Comments: Benign appearing wound VAC without an obvious complication on the left pelvis   Skin:     General: Skin is warm and dry.   Neurological:      General: No focal deficit present.      Mental Status: She is alert.           Diagnostic Study Results   Labs -  No results found for this or any previous visit (from the past 24 hour(s)).    Radiologic Studies -   XR CHEST PORTABLE    (Results Pending)     @[id number] @    Medications ordered:   Medications - No data to display      Medical Decision Making   Initial Medical Decision Making and  DDx:  Broad workup for weakness in a 67 year old with diabetes and wound.  Considered dehydration kidney failure missed MI.  Feel pneumonia unlikely.  UTI is most likely.    ED Course: Progress Notes, Reevaluation, and Consults:  ED Course as of 08/17/22 2244   2245 Aug 17, 2022   1949 My interpretation twelve-lead EKG sinus rhythm at a rate of 61 with a narrow complex no acute process [CB]   2239 My interpretation 1 view of the chest no acute cardiopulmonary process [CB]      ED Course User Index  [CB] 2240, MD     10:44 PM EST Pt reevaluated at this time.  Discussed results and findings, as well as, diagnosis and plan for discharge. Follow up with doctors/services listed.  Return to the emergency department for alarming symptoms.  Pt verbalizes understanding and agreement with plan. All questions addressed.    No findings to explain her fatigue certainly no UTI.  Discussed with the patient she is agreeable to going home her only complaint is leg cramps from being immobilized.    I am the first provider for this patient.    I reviewed the vital signs, available nursing notes, past medical history, past surgical history, family history and social history.    Patient Vitals for the past 12 hrs:   Temp Pulse Resp BP SpO2   08/17/22 1801 97.5 F (36.4 C) 64 18 (!) 192/65 99 %       Vital Signs-Reviewed the patient's vital signs.    Pulse Oximetry Analysis, Cardiac Monitor, 12 lead ekg:      Interpreted by the EP.      Records Reviewed: Nursing notes reviewed (Time of Review: 7:06 PM)    Procedures:   Critical Care Time: 0  If critical care time is note it is exclusive of any separately billable procedures.     Aspirin: (was aspirin given for stroke?)    Diagnosis     Disposition: DISPOSITION         DISCHARGE MEDICATIONS:  Current Discharge Medication List             Details   insulin glargine (LANTUS) 100 UNIT/ML injection vial Inject 30 Units into the skin daily  Qty: 1 each, Refills: 2       famotidine (PEPCID) 20 MG tablet Take 1 tablet by mouth daily  Qty: 60 tablet, Refills: 3      !! insulin lispro (HUMALOG) 100  UNIT/ML SOLN injection vial Inject 7 Units into the skin 3 times daily (with meals)  Qty: 1 each, Refills: 0      lactobacillus (CULTURELLE) capsule Take 1 capsule by mouth daily (with breakfast)  Qty: 60 capsule, Refills: 0      carvedilol (COREG) 6.25 MG tablet Take 1 tablet by mouth 2 times daily (with meals)  Qty: 60 tablet, Refills: 3      baclofen (LIORESAL) 10 MG tablet Take 1 tablet by mouth 3 times daily as needed (PAIN) Indications: Pain FOR PAIN PER SHETH      Magnesium Oxide 400 MG CAPS Take 1 capsule by mouth daily.      aspirin 325 MG tablet Take 1 tablet by mouth daily      acetaminophen (TYLENOL) 500 MG tablet Take 1 tablet by mouth every 6 hours as needed for Fever or Pain  Qty: 30 tablet, Refills: 0      !! insulin lispro (HUMALOG) 100 UNIT/ML SOLN injection vial Sliding Scale insulin as per Institutional protocol  Qty: 1 each, Refills: 0      atorvastatin (LIPITOR) 80 MG tablet Take 1 tablet by mouth nightly  Qty: 30 tablet, Refills: 3      ferrous sulfate (IRON 325) 325 (65 Fe) MG tablet Take 1 tablet by mouth daily  Qty: 30 tablet      Multiple Vitamins-Minerals (THERAPEUTIC MULTIVITAMIN-MINERALS) tablet Take 1 tablet by mouth daily      furosemide (LASIX) 40 MG tablet furosemide 40 mg tablet   TAKE 1 TABLET BY MOUTH EVERY DAY       !! - Potential duplicate medications found. Please discuss with provider.          DISCONTINUED MEDICATIONS:  Current Discharge Medication List          PATIENT REFERRED TO:  Follow Up with:  No follow-up provider specified.  _______________________________    Clinical Impression: No diagnosis found.     Doristine Counter, MD using Dragon dictation      _______________________________     Freddie Apley, MD  08/17/22 [phone]

## 2022-08-17 NOTE — ED Notes (Signed)
Report given to RN Laura.

## 2022-08-17 NOTE — ED Notes (Signed)
EKG completed and given to provider Beaudette MD. Purewick in place and set to suction. Line and labs being obtained by EDT [employer]

## 2022-08-17 NOTE — ED Triage Notes (Signed)
Pt arrived in ER complaining of dysuria, urine frequency, and confusion.

## 2022-08-18 ENCOUNTER — Encounter: Payer: PRIVATE HEALTH INSURANCE | Primary: Physician Assistant

## 2022-08-18 ENCOUNTER — Encounter: Admit: 2022-08-18 | Discharge: 2022-08-18 | Payer: PRIVATE HEALTH INSURANCE | Primary: Physician Assistant

## 2022-08-18 LAB — EKG 12-LEAD
Atrial Rate: 61 {beats}/min
Diagnosis: NORMAL
P Axis: 57 degrees
P-R Interval: 142 ms
Q-T Interval: 438 ms
QRS Duration: 80 ms
QTc Calculation (Bazett): 440 ms
R Axis: -5 degrees
T Axis: 5 degrees
Ventricular Rate: 61 {beats}/min

## 2022-08-18 LAB — CBC WITH AUTO DIFFERENTIAL
Basophils %: 0 % (ref 0–2)
Basophils Absolute: 0 10*3/uL (ref 0.0–0.1)
Eosinophils %: 4 % (ref 0–5)
Eosinophils Absolute: 0.2 10*3/uL (ref 0.0–0.4)
Hematocrit: 40.6 % (ref 35.0–45.0)
Hemoglobin: 12.7 g/dL (ref 12.0–16.0)
Immature Granulocytes %: 0 % (ref 0.0–0.5)
Immature Granulocytes Absolute: 0 10*3/uL (ref 0.00–0.04)
Lymphocytes %: 17 % — ABNORMAL LOW (ref 21–52)
Lymphocytes Absolute: 1 10*3/uL (ref 0.9–3.6)
MCH: 24.5 pg (ref 24.0–34.0)
MCHC: 31.3 g/dL (ref 31.0–37.0)
MCV: 78.2 FL (ref 78.0–100.0)
MPV: 9.6 FL (ref 9.2–11.8)
Monocytes %: 11 % — ABNORMAL HIGH (ref 3–10)
Monocytes Absolute: 0.6 10*3/uL (ref 0.05–1.2)
Neutrophils %: 67 % (ref 40–73)
Neutrophils Absolute: 3.7 10*3/uL (ref 1.8–8.0)
Nucleated RBCs: 0 /100{WBCs}
Platelets: 275 10*3/uL (ref 135–420)
RBC: 5.19 M/uL (ref 4.20–5.30)
RDW: 16.3 % — ABNORMAL HIGH (ref 11.6–14.5)
WBC: 5.5 10*3/uL (ref 4.6–13.2)
nRBC: 0 10*3/uL (ref 0.00–0.01)

## 2022-08-18 LAB — COMPREHENSIVE METABOLIC PANEL
ALT: 53 U/L (ref 13–56)
AST: 41 U/L — ABNORMAL HIGH (ref 10–38)
Albumin/Globulin Ratio: 0.9 (ref 0.8–1.7)
Albumin: 3.3 g/dL — ABNORMAL LOW (ref 3.4–5.0)
Alk Phosphatase: 155 U/L — ABNORMAL HIGH (ref 45–117)
Anion Gap: 4 mmol/L (ref 3.0–18)
BUN/Creatinine Ratio: 14 (ref 12–20)
BUN: 11 mg/dL (ref 7.0–18)
CO2: 30 mmol/L (ref 21–32)
Calcium: 9.6 mg/dL (ref 8.5–10.1)
Chloride: 108 mmol/L (ref 100–111)
Creatinine: 0.77 mg/dL (ref 0.6–1.3)
Est, Glom Filt Rate: >60 mL/min/{1.73_m2} (ref >60)
Globulin: 3.8 g/dL (ref 2.0–4.0)
Glucose: 57 mg/dL — ABNORMAL LOW (ref 74–99)
Potassium: 3.4 mmol/L — ABNORMAL LOW (ref 3.5–5.5)
Sodium: 142 mmol/L (ref 136–145)
Total Bilirubin: 0.3 mg/dL (ref 0.2–1.0)
Total Protein: 7.1 g/dL (ref 6.4–8.2)

## 2022-08-18 LAB — URINALYSIS, MICRO: RBC, UA: 3 /HPF (ref 0–5)

## 2022-08-18 LAB — URINALYSIS
Bilirubin Urine: NEGATIVE
Glucose, UA: NEGATIVE mg/dL
Ketones, Urine: NEGATIVE mg/dL
Leukocyte Esterase, Urine: NEGATIVE
Nitrite, Urine: NEGATIVE
Protein, UA: 30 mg/dL — AB
Specific Gravity, UA: 1.007 (ref 1.005–1.030)
Urobilinogen, Urine: 0.2 EU/dL (ref 0.2–1.0)
pH, Urine: 7.5 (ref 5.0–8.0)

## 2022-08-18 LAB — TROPONIN: Troponin, High Sensitivity: 10 ng/L (ref 0–54)

## 2022-08-18 NOTE — Home Health (Signed)
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 Pt required minimal/moderate assistance for ADLs at baseline.    CAREGIVER ASSISTANCE: Pts daughter assists with meals, medications, ADL's and transportation to appointments    Medications: Pt with no medication changes reported since last reviewed and educated to continue as directed per MD.    SUBJECTIVE: Pt daughter present throughout. Pt daughter and pt report she went to the ER last night with concerns for a UTI but came back negative and was sent home.      DME ORDERED/RECOMMENEDED: N/A    OBJECTIVE:  BATHING: Pt sponge bathing due to her sacral wound. Pt sponge bathing with minimal assistance  TOILETING: Pt minimal assistance for toileting  UB DRESSING: Pt minimal assistance for upper body dressing to donn/doff shirts  LB DRESSING: Pt moderate assistance  for lower body dressing to donn/doff socks, shoes and pants  GROOMING: Pt set up for grooming for hair care, oral care and washing hands/face  FEEDING: Pt set up for self feeding    BArthel Index for ADLs: 50/100  BUE MMT: 3+/5  BUE ROM:WFL    VISION:Pt vision is [facility] for reading directions/medications and to navigate their home environment safely.    BALANCE:Pt with good sitting balance/tolerance. Pt with  fair-/poor+ standing balance/tolerance    BUE COORDINATION: Pt B UE coordination WFL shown through serial opposition test. Pt reports they are able to open containers and manipulate clothing fasteners without difficulty.    BUE FINE MOTOR STRENGTH:Pt presents with B good hand strength for grasping objects for ADL/IADL tasks and opening containers. Pt is right handed.    OT instructed/demonstrated pt the following with good understanding:     IADL: Pt daughter assists with IADLs as needed  AMBULATION: Pt ambulated short house  hold distances using rolling walker with CGA/minimal assistance  EOB/BED TRANSFER: Pt sleeps up on couch due to inability to access bedroom due to heavy clutter  COUCH: Pt CGA for sit to stand transfers  TOILET: Pt minimal assistance for bedside commode transfers  TUB SHOWER:N/A pt sponge bathing     PATIENT RESPONSE TO TREATMENT: Pt responded well to skilled home health occupational therapy assessment    PATIENT EDUCATION PROVIDED THIS VISIT: OT role, adaptive equipment, BUE HEP, energy conservation, fall prevention/safety training ADL/IADL tasks, continue diet and medications as instructed per MD, consult MD or urgent care for medical assistance as opposed to ER unless situation emergent.     PATIENT LEVEL OF UNDERSTANDING OF EDUCATION PROVIDED: Pt able to teach back role of OT with good understanding. Pt educated on fall hazards to include cords/ wound vac cord in walking path from couch to bedside commode. Pt educated on her BUE HEP with handout to reference as needed.    REHAB POTENTIAL:Mrs. Vasallo presents with good rehab potential to increase her strength and balance needed for completion of her daily ADL/IADL routine. Pt with increased assistance from her daughter for all ADLs tasks and mobility needed for ADLs. Pt and caregiver in agreement for continued home health occupational therapy skilled services to increase her safety/independence within her home environment.     HOME EXERCISE PROGRAM:Pt educated on B UE strengthening against gravity for shoulder flexion/extension, shoulder shurgs, shoulder raises, scapular pinces and elbow flexion/extension    CONTINUED NEED FOR  THE FOLLOWING SKILLS: HH OT is medically necessary to address pain, decreased functional strength, decreased independence and safety with functional transfers, decreased independence and safety performing ADL/IADL tasks, decreased activity and standing tolerance, decreased functional endurance, and impaired balance in order to improve  functional independence, obtain set goals, reduce risk of falls, reduce pain, improve quality of life, and return to PLOF.    Skilled Care Provided: Pt completed full occupational therapy assessment to include balance, coordination, strengthening, ADL education/training, functional mobility and home safety.    ASSESSMENT: Pt presents with BUE weakness noted by BUE MMT of 3/5 and she would benifit from BUE strengthening HEP to increase her BUE strength/endurance needed for functional transfers. Pt with decreased safety/independence wiht funcitonal mobility needed for ADLs to include toilet transfers. She would benifit from functional mobility training to increase her independence and safety with mobility needed for her daily routine. Pt with increased assistance for ADLs from her daughter noted by Barthel Index for ADLs score of 50/100. She would benifit from ADL training with adaptive techniques/equipment to increase her independence and reduce need for caregiver assistance. Pt and caregiver requesting additional visits start next week due to multiple appointments this week and schedule "off" due to the holiday today.    PLAN: D/C 1w1, 2w3 with plans to discharge when goals are met or maximal potential achieved

## 2022-08-19 ENCOUNTER — Encounter: Admit: 2022-08-19 | Discharge: 2022-08-19 | Payer: PRIVATE HEALTH INSURANCE | Primary: Physician Assistant

## 2022-08-19 ENCOUNTER — Encounter: Payer: PRIVATE HEALTH INSURANCE | Primary: Physician Assistant

## 2022-08-19 NOTE — Home Health (Addendum)
 SUBJECTIVE: Pt reports she is doing okay today but gets tired very easily.  CAREGIVER INVOLVEMENT/ASSISTANCE NEEDED FOR: pts dtr in home and assist pt with all needs prn.  .  OBJECTIVE: See interventions.  PATIENT EDUCATION PROVIDED THIS VISIT: Pt instructed to continue HEP 2x/day and amb with FWW in home 3-4x/day with assist.  PATIENT RESPONSE TO EDUCATION PROVIDED:Pt reports she has been walking a little and doing some exercises.  PATIENT RESPONSE TO TREATMENT: Pt requires frequent rest breaks throughout PT session and reports she will try to do her HEP more often.  .  ASSESSMENT OF PROGRESS TOWARD GOALS: Pt continues to require frequent rest breaks throughout PT session secondary to c/o fatigue and LE weakness.  Pt is very talkative and requires frequent re direction to focus on PT today.  Aaron Aas  PLAN FOR NEXT VISIT: Will plan to continue PT working towards improving LE strength/ROM and transfer/gait abliity.  THE FOLLOWING DISCHARGE PLANNING WAS DISCUSSED WITH THE PATIENT/CAREGIVER: Pt is scheduled to continue PT 1 more visit this week thne 2w7.

## 2022-08-19 NOTE — Home Health (Signed)
Skilled reason for visit: Pt assessment and wound care    Caregiver involvement: Pt resides in a single story home with her daughter, pt amb with use of a walker and requires asst with all ADL's and medications, a PCA comes in during the week intermittently when the agency has someone available and the daughter is the primary CG and assts with transportation to appts and is available 24/7 to asst as needed.    This visit: Pt is A&Ox4 with some periods of forgetfulness, able to communicate her needs, no verbal c/o pain or discomfort at this time, pts daughter/CG was present for this visit which was able to flow much better than the last one, SN rendered wound care to pts sacral wound as ordered, wound remains stable, no changes noted, new vac dsg applied, wound vac noted to be functioning properly, pt continues to tolerate well, tx in progress continues.    Home health supplies by type and quantity ordered/delivered this visit include: N/A    Medications reviewed and all medications are available in the home this visit.    MD notified of any discrepancies/look a-like medications/medication interactions: NA    Medications are effective at this time.     Patient education provided this visit: SEE INTERVENTIONS LIST    Pt advised not to start, stop or change the way she is currently taking any of her medications without first consulting with the MD.    SN instructed pt to promptly report any redness, swelling, warmth, fever, chills, increased heart/respiratory rate, uncontrolled pain/tenderness, drainage: green/yellow/brown, or odor to MD immediately. No s/s of infection noted this visit no odor or foul drainage noted.      SN instructed pt to notify home health or physician for the following wound healing complications: increased drainage, pus or a foul odor coming from the wound, fever, muscle, joint, or body aches, sweating, increased swelling, redness or red streaks surrounding the wound; dramatic change in color or  size of wound; increase in pain at wound site in spite of interventions.    SN advised pt during shower/bath time to assess skin for evidence of pressure injury such as: non-blanchable erythema, blistering, discoloration, alteration of oral mucosa: signs of mucositis, yeast infection, or breaks in membrane, edema, rash and skin tears.  Report location, size, and severity of new or worsening findings to physician.    Agency Progress toward goals: progressing    Patient's Progress towards personal goals: progressing    Pt acknowledged understanding of all education provided.    Patient's Progress towards personal goals: when patient reaches goals and medication is managed, disease processes are understood patient agrees and understand that discharge will take place.

## 2022-08-19 NOTE — Telephone Encounter (Signed)
[name] Short, daughter, calling due to wound vac being discontinued by [facility]. Daughter would like a order put in to continue getting supplies. Please call daughter back at [phone].

## 2022-08-20 ENCOUNTER — Encounter: Payer: PRIVATE HEALTH INSURANCE | Primary: Physician Assistant

## 2022-08-20 ENCOUNTER — Ambulatory Visit (INDEPENDENT_AMBULATORY_CARE_PROVIDER_SITE_OTHER): Payer: Medicare Other | Admitting: Student

## 2022-08-20 ENCOUNTER — Other Ambulatory Visit: Payer: Self-pay

## 2022-08-20 ENCOUNTER — Encounter: Payer: Self-pay | Admitting: Student

## 2022-08-20 VITALS — BP 121/80 | HR 82 | Temp 98.1°F | Ht 63.0 in | Wt 209.7 lb

## 2022-08-20 DIAGNOSIS — Z0000 Encounter for general adult medical examination without abnormal findings: Secondary | ICD-10-CM

## 2022-08-20 DIAGNOSIS — F17210 Nicotine dependence, cigarettes, uncomplicated: Secondary | ICD-10-CM | POA: Diagnosis not present

## 2022-08-20 DIAGNOSIS — E782 Mixed hyperlipidemia: Secondary | ICD-10-CM

## 2022-08-20 DIAGNOSIS — I5042 Chronic combined systolic (congestive) and diastolic (congestive) heart failure: Secondary | ICD-10-CM

## 2022-08-20 DIAGNOSIS — E1142 Type 2 diabetes mellitus with diabetic polyneuropathy: Secondary | ICD-10-CM | POA: Diagnosis not present

## 2022-08-20 DIAGNOSIS — Z7984 Long term (current) use of oral hypoglycemic drugs: Secondary | ICD-10-CM

## 2022-08-20 DIAGNOSIS — Z8639 Personal history of other endocrine, nutritional and metabolic disease: Secondary | ICD-10-CM

## 2022-08-20 DIAGNOSIS — G9520 Unspecified cord compression: Secondary | ICD-10-CM

## 2022-08-20 DIAGNOSIS — Z1231 Encounter for screening mammogram for malignant neoplasm of breast: Secondary | ICD-10-CM

## 2022-08-20 DIAGNOSIS — Z720 Tobacco use: Secondary | ICD-10-CM

## 2022-08-20 DIAGNOSIS — Z1211 Encounter for screening for malignant neoplasm of colon: Secondary | ICD-10-CM

## 2022-08-20 DIAGNOSIS — Z23 Encounter for immunization: Secondary | ICD-10-CM

## 2022-08-20 LAB — POCT GLYCOSYLATED HEMOGLOBIN (HGB A1C): Hemoglobin A1C: 6.2 % — AB (ref 4.0–5.6)

## 2022-08-20 LAB — GLUCOSE, CAPILLARY: Glucose-Capillary: 105 mg/dL — ABNORMAL HIGH (ref 70–99)

## 2022-08-20 MED ORDER — SPIRONOLACTONE 25 MG PO TABS: 25.0000 mg | ORAL_TABLET | Freq: Every day | ORAL | 3 refills | Status: DC

## 2022-08-20 MED ORDER — FUROSEMIDE 40 MG PO TABS: 40.0000 mg | ORAL_TABLET | Freq: Two times a day (BID) | ORAL | 3 refills | Status: DC

## 2022-08-20 MED ORDER — POTASSIUM CHLORIDE CRYS ER 20 MEQ PO TBCR: 20.0000 meq | EXTENDED_RELEASE_TABLET | Freq: Every day | ORAL | 3 refills | Status: DC

## 2022-08-20 MED ORDER — BUPROPION HCL ER (SR) 150 MG PO TB12: 150.0000 mg | ORAL_TABLET | Freq: Two times a day (BID) | ORAL | 2 refills | Status: DC

## 2022-08-20 MED ORDER — ATORVASTATIN CALCIUM 40 MG PO TABS: 40.0000 mg | ORAL_TABLET | Freq: Every day | ORAL | 3 refills | Status: DC

## 2022-08-20 MED ORDER — ENALAPRIL MALEATE 5 MG PO TABS: 5.0000 mg | ORAL_TABLET | Freq: Two times a day (BID) | ORAL | 2 refills | Status: DC

## 2022-08-20 MED ORDER — SYNJARDY 5-500 MG PO TABS: 1.0000 | ORAL_TABLET | Freq: Two times a day (BID) | ORAL | 3 refills | Status: DC

## 2022-08-20 MED ORDER — METOPROLOL SUCCINATE ER 50 MG PO TB24: ORAL_TABLET | ORAL | 3 refills | Status: DC

## 2022-08-20 NOTE — Progress Notes (Signed)
Discussed with patient A1c during clinic visit. Continued her synjardy.

## 2022-08-20 NOTE — Patient Instructions (Signed)
Please call Dr. Salem Senate office to see if you need a follow up.

## 2022-08-21 ENCOUNTER — Inpatient Hospital Stay: Payer: PRIVATE HEALTH INSURANCE | Primary: Physician Assistant

## 2022-08-21 ENCOUNTER — Encounter: Admit: 2022-08-21 | Discharge: 2022-08-21 | Payer: PRIVATE HEALTH INSURANCE | Primary: Physician Assistant

## 2022-08-21 ENCOUNTER — Encounter: Payer: PRIVATE HEALTH INSURANCE | Primary: Physician Assistant

## 2022-08-21 ENCOUNTER — Encounter

## 2022-08-21 ENCOUNTER — Inpatient Hospital Stay: Admit: 2022-08-21 | Payer: PRIVATE HEALTH INSURANCE | Primary: Physician Assistant

## 2022-08-21 DIAGNOSIS — E11649 Type 2 diabetes mellitus with hypoglycemia without coma: Secondary | ICD-10-CM

## 2022-08-21 DIAGNOSIS — Z981 Arthrodesis status: Secondary | ICD-10-CM

## 2022-08-21 LAB — BMP8+ANION GAP
Anion Gap: 22 mmol/L — ABNORMAL HIGH (ref 10.0–18.0)
BUN/Creatinine Ratio: 20 (ref 12–28)
BUN: 11 mg/dL (ref 8–27)
CO2: 20 mmol/L (ref 20–29)
Calcium: 9.6 mg/dL (ref 8.7–10.3)
Chloride: 101 mmol/L (ref 96–106)
Creatinine, Ser: 0.54 mg/dL — ABNORMAL LOW (ref 0.57–1.00)
Glucose: 104 mg/dL — ABNORMAL HIGH (ref 70–99)
Potassium: 4 mmol/L (ref 3.5–5.2)
Sodium: 143 mmol/L (ref 134–144)
eGFR: 103 mL/min/{1.73_m2} (ref >59)

## 2022-08-21 LAB — MICROALBUMIN / CREATININE URINE RATIO
Creatinine, Urine: 13.8 mg/dL
Microalb/Creat Ratio: <22 mg/g creat (ref 0–29)
Microalbumin, Urine: <3 ug/mL

## 2022-08-21 NOTE — Telephone Encounter (Signed)
Pt daughter calling in states we cancelled her wound vac order and that [facility] has been reaching out to our office and getting no replies, I spoke with [facility] they just did wound vac change on 08/19/22 and have no order to cancel wound vac, pt had been sick or in Er/ hospital and [facility] did not go out for a wound vac change during that time. I will contact [facility] and find out what is needed. Anna at [facility] states there was an order from our office to discontinue wound vac. Sherri speaking with [employer].

## 2022-08-22 ENCOUNTER — Encounter: Payer: PRIVATE HEALTH INSURANCE | Primary: Physician Assistant

## 2022-08-22 ENCOUNTER — Encounter: Admit: 2022-08-22 | Discharge: 2022-08-22 | Payer: PRIVATE HEALTH INSURANCE | Primary: Physician Assistant

## 2022-08-22 NOTE — Home Health (Addendum)
 SUBJECTIVE: Pt reports she is doing better and walking during the day more often.  CAREGIVER INVOLVEMENT/ASSISTANCE NEEDED FOR: pts dtr present during PT session and assist pt with all needs prn.  .  OBJECTIVE: See interventions.  PATIENT EDUCATION PROVIDED THIS VISIT: Pt instructed to continue HEP 2x/day to BLEs and amb with FWW in home with assist every 2-3 hr.  PATIENT RESPONSE TO EDUCATION PROVIDED: Pt reports she has been walking a few times/day and does some exercises.  PATIENT RESPONSE TO TREATMENT: Pt continues to require frequent rest breaks throughout PT session secondary to c/o fatigue.  Vitals remained WFL throughout PT session.  .  ASSESSMENT OF PROGRESS TOWARD GOALS: Pt has increased amb distance in home today x 30 ft but continues to require safety cuing for safe walker placement/posture through narrow pathways.  Pt is very cluttered and pt requires extra time/effort also to [employer] walker.  Aaron Aas  PLAN FOR NEXT VISIT: Will plan to continue progressing amb distance and standing exercises to improve gait ability and balance.  THE FOLLOWING DISCHARGE PLANNING WAS DISCUSSED WITH THE PATIENT/CAREGIVER:Pt is scheduled to continue PT 2x/wk next week.

## 2022-08-23 ENCOUNTER — Inpatient Hospital Stay: Admit: 2022-08-24 | Payer: PRIVATE HEALTH INSURANCE | Primary: Physician Assistant

## 2022-08-23 ENCOUNTER — Emergency Department: Admit: 2022-08-23 | Payer: PRIVATE HEALTH INSURANCE | Primary: Physician Assistant

## 2022-08-23 ENCOUNTER — Encounter: Admit: 2022-08-23 | Discharge: 2022-08-23 | Payer: PRIVATE HEALTH INSURANCE | Primary: Physician Assistant

## 2022-08-23 ENCOUNTER — Inpatient Hospital Stay
Admission: EM | Admit: 2022-08-23 | Discharge: 2022-08-26 | Disposition: A | Discharge status: Home Health | Payer: PRIVATE HEALTH INSURANCE | Admitting: Hospitalist

## 2022-08-23 DIAGNOSIS — R4182 Altered mental status, unspecified: Secondary | ICD-10-CM

## 2022-08-23 LAB — POCT GLUCOSE
POC Glucose: 63 mg/dL — ABNORMAL LOW (ref 70–110)
POC Glucose: 148 mg/dL — ABNORMAL HIGH (ref 70–110)
POC Glucose: 72 mg/dL (ref 70–110)
POC Glucose: 163 mg/dL — ABNORMAL HIGH (ref 70–110)
POC Glucose: 127 mg/dL — ABNORMAL HIGH (ref 70–110)

## 2022-08-23 LAB — COMPREHENSIVE METABOLIC PANEL
ALT: 58 U/L — ABNORMAL HIGH (ref 13–56)
AST: 55 U/L — ABNORMAL HIGH (ref 10–38)
Albumin/Globulin Ratio: 0.8 (ref 0.8–1.7)
Albumin: 3.1 g/dL — ABNORMAL LOW (ref 3.4–5.0)
Alk Phosphatase: 135 U/L — ABNORMAL HIGH (ref 45–117)
Anion Gap: 3 mmol/L (ref 3.0–18)
BUN/Creatinine Ratio: 17 (ref 12–20)
BUN: 12 mg/dL (ref 7.0–18)
CO2: 33 mmol/L — ABNORMAL HIGH (ref 21–32)
Calcium: 9 mg/dL (ref 8.5–10.1)
Chloride: 108 mmol/L (ref 100–111)
Creatinine: 0.72 mg/dL (ref 0.6–1.3)
Est, Glom Filt Rate: >60 mL/min/{1.73_m2} (ref >60)
Globulin: 3.9 g/dL (ref 2.0–4.0)
Glucose: 49 mg/dL — CL (ref 74–99)
Potassium: 3.6 mmol/L (ref 3.5–5.5)
Sodium: 144 mmol/L (ref 136–145)
Total Bilirubin: 0.5 mg/dL (ref 0.2–1.0)
Total Protein: 7 g/dL (ref 6.4–8.2)

## 2022-08-23 LAB — CBC WITH AUTO DIFFERENTIAL
Basophils %: 0 % (ref 0–2)
Basophils Absolute: 0 10*3/uL (ref 0.0–0.1)
Eosinophils %: 3 % (ref 0–5)
Eosinophils Absolute: 0.2 10*3/uL (ref 0.0–0.4)
Hematocrit: 41.1 % (ref 35.0–45.0)
Hemoglobin: 12.8 g/dL (ref 12.0–16.0)
Immature Granulocytes %: 0 % (ref 0.0–0.5)
Immature Granulocytes Absolute: 0 10*3/uL (ref 0.00–0.04)
Lymphocytes %: 14 % — ABNORMAL LOW (ref 21–52)
Lymphocytes Absolute: 0.7 10*3/uL — ABNORMAL LOW (ref 0.9–3.6)
MCH: 24.5 pg (ref 24.0–34.0)
MCHC: 31.1 g/dL (ref 31.0–37.0)
MCV: 78.6 FL (ref 78.0–100.0)
MPV: 10.2 FL (ref 9.2–11.8)
Monocytes %: 9 % (ref 3–10)
Monocytes Absolute: 0.4 10*3/uL (ref 0.05–1.2)
Neutrophils %: 74 % — ABNORMAL HIGH (ref 40–73)
Neutrophils Absolute: 3.5 10*3/uL (ref 1.8–8.0)
Nucleated RBCs: 0 /100{WBCs}
Platelets: 238 10*3/uL (ref 135–420)
RBC: 5.23 M/uL (ref 4.20–5.30)
RDW: 16 % — ABNORMAL HIGH (ref 11.6–14.5)
WBC: 4.8 10*3/uL (ref 4.6–13.2)
nRBC: 0 10*3/uL (ref 0.00–0.01)

## 2022-08-23 LAB — URINALYSIS
Bilirubin Urine: NEGATIVE
Blood, Urine: NEGATIVE
Glucose, UA: NEGATIVE mg/dL
Ketones, Urine: NEGATIVE mg/dL
Leukocyte Esterase, Urine: NEGATIVE
Nitrite, Urine: POSITIVE — AB
Protein, UA: 30 mg/dL — AB
Specific Gravity, UA: 1.009 (ref 1.005–1.030)
Urobilinogen, Urine: 0.2 EU/dL (ref 0.2–1.0)
pH, Urine: 7 (ref 5.0–8.0)

## 2022-08-23 LAB — LACTATE, SEPSIS
Lactic Acid, Sepsis: 2.4 MMOL/L (ref 0.4–2.0)
Lactic Acid, Sepsis: 0.8 MMOL/L (ref 0.4–2.0)

## 2022-08-23 LAB — TROPONIN
Troponin, High Sensitivity: 9 ng/L (ref 0–54)
Troponin, High Sensitivity: 9 ng/L (ref 0–54)

## 2022-08-23 LAB — URINALYSIS, MICRO
RBC, UA: 0 /HPF (ref 0–5)
WBC, UA: 0 /HPF (ref 0–4)

## 2022-08-23 LAB — PROCALCITONIN: Procalcitonin: <0.05 ng/mL

## 2022-08-23 LAB — PROTIME-INR
INR: 1 (ref 0.9–1.1)
Protime: 13.7 s (ref 11.9–14.7)

## 2022-08-23 LAB — MAGNESIUM: Magnesium: 1.2 mg/dL — ABNORMAL LOW (ref 1.6–2.6)

## 2022-08-23 MED ORDER — SENNOSIDES 8.6 MG PO TABS
8.6 MG | Freq: Every day | ORAL | Status: AC | PRN
Start: 2022-08-23 — End: 2022-08-25

## 2022-08-23 MED ORDER — ONDANSETRON 4 MG PO TBDP
4 MG | Freq: Three times a day (TID) | ORAL | Status: AC | PRN
Start: 2022-08-23 — End: 2022-08-25

## 2022-08-23 MED ORDER — FAMOTIDINE 20 MG PO TABS
20 MG | Freq: Every day | ORAL | Status: AC
Start: 2022-08-23 — End: 2022-08-25
  Administered 2022-08-24 – 2022-08-25 (×2): 20 mg via ORAL

## 2022-08-23 MED ORDER — ONDANSETRON HCL 4 MG/2ML IJ SOLN
4 MG/2ML | Freq: Four times a day (QID) | INTRAMUSCULAR | Status: AC | PRN
Start: 2022-08-23 — End: 2022-08-25

## 2022-08-23 MED ORDER — ATORVASTATIN CALCIUM 40 MG PO TABS
40 MG | Freq: Every evening | ORAL | Status: AC
Start: 2022-08-23 — End: 2022-08-25
  Administered 2022-08-24 – 2022-08-25 (×2): 80 mg via ORAL

## 2022-08-23 MED ORDER — ASPIRIN 81 MG PO CHEW
81 MG | Freq: Every day | ORAL | Status: DC
Start: 2022-08-23 — End: 2022-08-23

## 2022-08-23 MED ORDER — CLOPIDOGREL BISULFATE 75 MG PO TABS
75 MG | Freq: Every day | ORAL | Status: AC
Start: 2022-08-23 — End: 2022-08-24
  Administered 2022-08-23 – 2022-08-24 (×2): 75 mg via ORAL

## 2022-08-23 MED ORDER — SODIUM CHLORIDE 0.9 % IV SOLN
0.9 % | INTRAVENOUS | Status: AC
Start: 2022-08-23 — End: 2022-08-24
  Administered 2022-08-24: 01:00:00 via INTRAVENOUS

## 2022-08-23 MED ORDER — HYDRALAZINE HCL 10 MG PO TABS
10 MG | Freq: Every day | ORAL | Status: DC
Start: 2022-08-23 — End: 2022-08-23
  Administered 2022-08-23: 10 mg via ORAL

## 2022-08-23 MED ORDER — ASPIRIN 300 MG RE SUPP
300 MG | Freq: Every day | RECTAL | Status: DC
Start: 2022-08-23 — End: 2022-08-23

## 2022-08-23 MED ORDER — GLUCAGON (RDNA) 1 MG IJ KIT
1 MG | Freq: Once | INTRAMUSCULAR | Status: AC
Start: 2022-08-23 — End: 2022-08-23
  Administered 2022-08-23: 19:00:00 1 mg via INTRAVENOUS

## 2022-08-23 MED ORDER — LABETALOL HCL 5 MG/ML IV SOLN
5 MG/ML | INTRAVENOUS | Status: AC | PRN
Start: 2022-08-23 — End: 2022-08-25

## 2022-08-23 MED ORDER — SODIUM CHLORIDE 0.9 % IV BOLUS
0.9 % | Freq: Once | INTRAVENOUS | Status: AC
Start: 2022-08-23 — End: 2022-08-23
  Administered 2022-08-23: 19:00:00 2040 mL/kg via INTRAVENOUS

## 2022-08-23 MED ORDER — STERILE WATER FOR INJECTION (MIXTURES ONLY)
1 g | INTRAMUSCULAR | Status: AC
Start: 2022-08-23 — End: 2022-08-23
  Administered 2022-08-23: 23:00:00 1000 mg via INTRAVENOUS

## 2022-08-23 MED ORDER — IOPAMIDOL 76 % IV SOLN
76 % | Freq: Once | INTRAVENOUS | Status: AC | PRN
Start: 2022-08-23 — End: 2022-08-23
  Administered 2022-08-23: 23:00:00 100 mL via INTRAVENOUS

## 2022-08-23 MED ORDER — DEXTROSE 10 % IV BOLUS
Freq: Once | INTRAVENOUS | Status: AC
Start: 2022-08-23 — End: 2022-08-23
  Administered 2022-08-23: 18:00:00 250 mL via INTRAVENOUS

## 2022-08-23 MED ORDER — MAGNESIUM SULFATE 4000 MG/100 ML IVPB PREMIX
4 GM/100ML | INTRAVENOUS | Status: AC
Start: 2022-08-23 — End: 2022-08-23
  Administered 2022-08-23: 22:00:00 4000 mg via INTRAVENOUS

## 2022-08-23 MED ORDER — ASPIRIN 325 MG PO TABS
325 MG | Freq: Every day | ORAL | Status: DC
Start: 2022-08-23 — End: 2022-08-25
  Administered 2022-08-23 – 2022-08-25 (×3): 325 mg via ORAL

## 2022-08-23 MED FILL — ISOVUE-370 76 % IV SOLN: 76 % | INTRAVENOUS | Qty: 100

## 2022-08-23 MED FILL — SODIUM CHLORIDE 0.9 % IV SOLN: 0.9 % | INTRAVENOUS | Qty: 3000

## 2022-08-23 MED FILL — CEFTRIAXONE SODIUM 1 G IJ SOLR: 1 g | INTRAMUSCULAR | Qty: 1000

## 2022-08-23 MED FILL — HYDRALAZINE HCL 10 MG PO TABS: 10 MG | ORAL | Qty: 1

## 2022-08-23 MED FILL — GLUCAGON EMERGENCY 1 MG IJ KIT: 1 MG | INTRAMUSCULAR | Qty: 1

## 2022-08-23 MED FILL — MAGNESIUM SULFATE 4 GM/100ML IV SOLN: 4 GM/100ML | INTRAVENOUS | Qty: 100

## 2022-08-23 MED FILL — CLOPIDOGREL BISULFATE 75 MG PO TABS: 75 MG | ORAL | Qty: 1

## 2022-08-23 MED FILL — ASPIRIN ADULT 325 MG PO TABS: 325 MG | ORAL | Qty: 1

## 2022-08-23 MED FILL — DEXTROSE 10 % IV SOLN: 10 % | INTRAVENOUS | Qty: 250

## 2022-08-23 NOTE — Progress Notes (Signed)
[place] Pharmacokinetic Monitoring Service - Vancomycin     [name] Short is a 67 y.o. female starting on vancomycin therapy for Sepsis of Unknown Etiology, Skin and Soft Tissue Infection, Urinary Tract Infection. Pharmacy consulted for monitoring and adjustment.    Target Concentration: Goal AUC/MIC 400-600 mg*hr/L    Additional Antimicrobials: Piperacillin/Tazobactam    Pertinent Laboratory Values:   Temp: 97.8 F (36.6 C), Weight - Scale: 68 kg (150 lb)  Recent Labs     08/23/22  1305   CREATININE 0.72   BUN 12   WBC 4.8   PROCAL <0.05     Estimated Creatinine Clearance: 77 mL/min (based on SCr of 0.72 mg/dL).    Pertinent Cultures:  Culture Date Source Results   1/20  1/20 Blood x 2  Urine IP  IP   MRSA Nasal Swab: N/A. Non-respiratory infection    Plan:  Dosing recommendations based on Bayesian software  Start vancomycin 1,500 mg loading dose x 1, followed by Vancomycin 1,000 mg every 12 hours  Anticipated AUC of 556 mg/L.hr and trough concentration of 15.4 mg/L at steady state  Renal labs as indicated   Vancomycin concentration ordered for  1/22 @ 0600  Pharmacy will continue to monitor patient and adjust therapy as indicated    Thank you for the consult,  Granville Lewis, [facility]  08/23/2022

## 2022-08-23 NOTE — ED Provider Notes (Signed)
[facility] EMERGENCY DEPT  EMERGENCY DEPARTMENT ENCOUNTER      Pt Name: [name] Short  MRN: [id number]  [place] Apr 04, 1956  Date of evaluation: 08/23/2022  Provider: Ronnie Derby, PA  6:31 PM    CHIEF COMPLAINT       Chief Complaint   Patient presents with    Hypoglycemia         HISTORY OF PRESENT ILLNESS    [name] Short is a 67 y.o. female who presents to the emergency department found altered by caregiver today.  She was responsive but not alert and conversational when EMS arrived and blood sugar was 32.  Given 140 cc of D10 infusion.  Lost IV IV replaced.  Patient has wound VAC and says the only pain that she has is on her sacrum where the wound VAC is.  She is not on any antibiotics.  She shrugs her shoulders at me when I ask her if she had any chest pain or shortness of breath.  No abdominal tenderness.  Does not ambulate except with walker and was not out of bed when all of the above occurred.  No signs of trauma in the room.  Prior CVA hx with right sided deficits.  Went to bed feeling well.    HPI    Nursing Notes were reviewed.    REVIEW OF SYSTEMS       Review of Systems   Constitutional: Negative.    HENT: Negative.     Eyes: Negative.    Respiratory:  Negative for shortness of breath.    Cardiovascular:  Negative for chest pain.   Gastrointestinal:  Negative for abdominal pain, diarrhea, nausea and vomiting.   Endocrine: Negative.    Genitourinary: Negative.    Musculoskeletal:  Positive for back pain.   Skin:  Positive for wound.   Allergic/Immunologic: Negative.    Neurological: Negative.    Hematological:  Bruises/bleeds easily.   Psychiatric/Behavioral:  Positive for confusion.        Except as noted above the remainder of the review of systems was reviewed and negative.       PAST MEDICAL HISTORY     Past Medical History:   Diagnosis Date    Anemia     Arthritis     Chronic pain     Legs and Shoulders    Diabetes ([place])     Exposure to asbestos     History of blood transfusion     History of colon polyps      HTN (hypertension)     Hx of blood clots     DVT    Hyperlipemia     Menopause     Pulmonary nodule     Serum calcium elevated     Sleep apnea     not using cpap    Stroke [facility])          SURGICAL HISTORY       Past Surgical History:   Procedure Laterality Date    BACK SURGERY N/A 06/24/2022    INCISION AND DRAINAGE /DEBRIDEMENT SACRAL DECUBITUS performed by Nestor Ramp, DO at [place]    COLONOSCOPY N/A 01/13/2017    COLONOSCOPY performed by Joycelyn Schmid, MD at [place] N/A 12/28/2021    SACRAL WOUND DEBRIDEMENT INCISION AND DRAINAGE performed by Nestor Ramp, DO at [facility]  MAIN OR    SPINE SURGERY N/A 12/09/2021    CERVICAL THREE/FOUR/FIVE/SIX LAMINECTOMY FUSION; C-ARM; STRYKER; EXT BONE GROWTH STIM; 23 HR performed by Candi Leash, MD at [place]       Previous Medications    ACETAMINOPHEN (TYLENOL) 500 MG TABLET    Take 1 tablet by mouth every 6 hours as needed for Fever or Pain    ASPIRIN 325 MG TABLET    Take 1 tablet by mouth daily    ATORVASTATIN (LIPITOR) 80 MG TABLET    Take 1 tablet by mouth nightly    BACLOFEN (LIORESAL) 10 MG TABLET    Take 1 tablet by mouth 3 times daily as needed (PAIN) Indications: Pain FOR PAIN PER SHETH    CARVEDILOL (COREG) 6.25 MG TABLET    Take 1 tablet by mouth 2 times daily (with meals)    FAMOTIDINE (PEPCID) 20 MG TABLET    Take 1 tablet by mouth daily    FERROUS SULFATE (IRON 325) 325 (65 FE) MG TABLET    Take 1 tablet by mouth daily    FUROSEMIDE (LASIX) 40 MG TABLET    furosemide 40 mg tablet   TAKE 1 TABLET BY MOUTH EVERY DAY    INSULIN GLARGINE (LANTUS) 100 UNIT/ML INJECTION VIAL    Inject 30 Units into the skin daily    INSULIN LISPRO (HUMALOG) 100 UNIT/ML SOLN INJECTION VIAL    Sliding Scale insulin as per Institutional protocol    INSULIN LISPRO (HUMALOG) 100 UNIT/ML SOLN INJECTION VIAL    Inject 7 Units into the skin 3  times daily (with meals)    LACTOBACILLUS (CULTURELLE) CAPSULE    Take 1 capsule by mouth daily (with breakfast)    MAGNESIUM OXIDE 400 MG CAPS    Take 1 capsule by mouth daily.    MULTIPLE VITAMINS-MINERALS (THERAPEUTIC MULTIVITAMIN-MINERALS) TABLET    Take 1 tablet by mouth daily       ALLERGIES     Lactose    FAMILY HISTORY       Family History   Problem Relation Age of Onset    Diabetes Other 49        parent, NOS    Cancer Father     Hypertension Other 35        parent,NOS    Heart Disease Other 43        parent, NOS    Lung Disease Father           SOCIAL HISTORY       Social History     Socioeconomic History    Marital status: Single     Spouse name: None    Number of children: None    Years of education: None    Highest education level: None   Tobacco Use    Smoking status: Former     Current packs/day: 0.00     Types: Cigarettes, Cigars     Quit date: 11/13/2021     Years since quitting: 0.7    Smokeless tobacco: Never   Vaping Use    Vaping Use: Never used   Substance and Sexual Activity    Alcohol use: Not Currently     Comment: occ    Drug use: Never     Social Determinants of Health     Financial Resource Strain: Low Risk  (01/05/2022)    Overall Financial Resource Strain (CARDIA)  Difficulty of Paying Living Expenses: Not hard at all   Transportation Needs: No Transportation Needs (08/13/2022)    OASIS A1250: Transportation     Lack of Transportation (Medical): No     Lack of Transportation (Non-Medical): No     Patient Unable or Declines to Respond: No   Physical Activity: Inactive (01/05/2022)    Exercise Vital Sign     Days of Exercise per Week: 0 days     Minutes of Exercise per Session: 0 min   Stress: No Stress Concern Present (01/05/2022)    [employer] of Occupational Health - Occupational Stress Questionnaire     Feeling of Stress : Not at all   Social Connections: Feeling Socially Integrated (08/13/2022)    OASIS D0700: Social Isolation     Frequency of experiencing loneliness or isolation:  Never   Intimate Partner Violence: Not At Risk (01/05/2022)    Humiliation, Afraid, Rape, and Kick questionnaire     Fear of Current or Ex-Partner: No     Emotionally Abused: No     Physically Abused: No     Sexually Abused: No   Housing Stability: Low Risk  (01/05/2022)    Housing Stability Vital Sign     Unable to Pay for Housing in the Last Year: No     Number of Places Lived in the Last Year: 1     Unstable Housing in the Last Year: No       SCREENINGS   NIH Stroke Scale  Interval: Baseline  Level of Consciousness (1a): Alert  LOC Questions (1b): Answers both correctly  LOC Commands (1c): Performs both tasks correctly  Best Gaze (2): Normal  Visual (3): No visual loss  Facial Palsy (4): Normal symmetrical movement  Motor Arm, Left (5a): No drift  Motor Arm, Right (5b): No drift  Motor Leg, Left (6a): No drift  Motor Leg, Right (6b): Some effort against gravity  Limb Ataxia (7): Absent  Sensory (8): Normal  Best Language (9): No aphasia  Dysarthria (10): Normal  Extinction and Inattention (11): No abnormality  Total: 2     Glasgow Coma Scale  Eye Opening: Spontaneous  Best Verbal Response: Oriented  Best Motor Response: Obeys commands  Glasgow Coma Scale Score: 15                     CIWA Assessment  BP: (!) 179/81  Pulse: 74                 PHYSICAL EXAM       ED Triage Vitals   BP Temp Temp src Pulse Resp SpO2 Height Weight   -- -- -- -- -- -- -- --       Physical Exam  Constitutional:       General: She is not in acute distress.     Appearance: Normal appearance. She is normal weight. She is not toxic-appearing.   HENT:      Head: Normocephalic.      Nose: Nose normal.      Mouth/Throat:      Mouth: Mucous membranes are moist.   Eyes:      Extraocular Movements: Extraocular movements intact.      Comments: No nystagmus.  Negative test of skew.   Cardiovascular:      Rate and Rhythm: Normal rate and regular rhythm.      Pulses: Normal pulses.      Heart sounds: Normal heart sounds.   Pulmonary:  Effort:  Pulmonary effort is normal.      Breath sounds: Normal breath sounds.   Abdominal:      General: Abdomen is flat.      Tenderness: There is no abdominal tenderness.   Musculoskeletal:         General: No deformity.      Cervical back: Normal range of motion and neck supple. No rigidity.   Skin:     General: Skin is warm.      Findings: Lesion present.      Comments: Decubitus sacral wound with wound VAC Liters with clean margins and no redness swelling   Neurological:      Mental Status: She is alert and oriented to person, place, and time.      Motor: Weakness present.      Comments: RUE and RLE weakness.  RUE strength very slight.  RLE strength 3/5.   Psychiatric:         Mood and Affect: Mood normal.         Behavior: Behavior normal.         Thought Content: Thought content normal.         Judgment: Judgment normal.         DIAGNOSTIC RESULTS     EKG: All EKG's are interpreted by the Emergency Department Physician who either signs or Co-signs this chart in the absence of a cardiologist.    EKG shows sinus bradycardia with a ventricular rate of 53.  No focal of 130 QRS duration 78 and a QTc of 467 without any ST elevations depressions or T wave inversions.    RADIOLOGY:   Non-plain film images such as CT, Ultrasound and MRI are read by the radiologist. Plain radiographic images are visualized and preliminarily interpreted by the emergency physician with the below findings:        Interpretation per the Radiologist below, if available at the time of this note:    CTA HEAD NECK W WO CONTRAST         CT Head W/O Contrast   Final Result      Hypoattenuation in the left cerebrum involving the deep periventricular white   matter and adjacent basal ganglia, new compared to the head CT from 06/20/2022,   suspicious for edema which may be a subacute/evolving infarct.      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  Other nonacute appearing  findings as   above.      I have telephoned a wet reading directly to  PA Waldo LaineJana Nekia Maxham at 5:00 PM on   08/23/2022.       XR CHEST PORTABLE   Final Result      Mild prominence of the interstitial markings without evidence of focal pulmonary   consolidation. Borderline cardiomegaly.             ED BEDSIDE ULTRASOUND:   Performed by ED Physician - none    LABS:  Labs Reviewed   CBC WITH AUTO DIFFERENTIAL - Abnormal; Notable for the following components:       Result Value    RDW 16.0 (*)     Neutrophils % 74 (*)     Lymphocytes % 14 (*)     Lymphocytes Absolute 0.7 (*)     All other components within normal limits   COMPREHENSIVE METABOLIC PANEL - Abnormal; Notable for the following components:    CO2 33 (*)  Glucose 49 (*)     ALT 58 (*)     AST 55 (*)     Alk Phosphatase 135 (*)     Albumin 3.1 (*)     All other components within normal limits   MAGNESIUM - Abnormal; Notable for the following components:    Magnesium 1.2 (*)     All other components within normal limits   URINALYSIS - Abnormal; Notable for the following components:    Protein, UA 30 (*)     Nitrite, Urine Positive (*)     All other components within normal limits   LACTATE, SEPSIS - Abnormal; Notable for the following components:    Lactic Acid, Sepsis 2.4 (*)     All other components within normal limits   URINALYSIS, MICRO - Abnormal; Notable for the following components:    BACTERIA, URINE 2+ (*)     All other components within normal limits   POCT GLUCOSE - Abnormal; Notable for the following components:    POC Glucose 63 (*)     All other components within normal limits   POCT GLUCOSE - Abnormal; Notable for the following components:    POC Glucose 148 (*)     All other components within normal limits   POCT GLUCOSE - Abnormal; Notable for the following components:    POC Glucose 163 (*)     All other components within normal limits   POCT GLUCOSE - Abnormal; Notable for the following components:    POC Glucose 127 (*)     All other components  within normal limits   CULTURE, BLOOD 1   CULTURE, BLOOD 2   TROPONIN   PROCALCITONIN   LACTATE, SEPSIS   TROPONIN   PROTIME-INR   POCT GLUCOSE       All other labs were within normal range or not returned as of this dictation.    EMERGENCY DEPARTMENT COURSE and DIFFERENTIAL DIAGNOSIS/MDM:   Vitals:    Vitals:    08/23/22 1704 08/23/22 1715 08/23/22 1800 08/23/22 1815   BP: (!) 171/66 (!) 165/90 (!) 179/81    Pulse: 66 70 74    Resp: 13 10 12     Temp: 97.1 F (36.2 C)   97.8 F (36.6 C)   TempSrc: Oral   Oral   SpO2: 100%  100%    Weight:       Height:           Different to include hypoglycemia UTI infection wound infection sepsis pneumonia COVID flu electrolyte abnormalities    Patient went to bed last night and feeling well at around 1230.  Was unresponsive to her caregiver who arrived and her blood sugar was found by EMS to be in the 30s given an amp of D10 in the field but this did not have much improvement as she lost her IV.  Amp of D10 given here blood sugar had dropped after this and an amp of glucagon was given.  Her blood sugar has since normalized; she was also hypothermic.  Code sepsis was initiated [employer] hugger placed.  Her wound on her sacrum appears at baseline.  Appears to be healing extremely well not the source of any kind of acute infection.  She does have a urinary tract infection and Rocephin has been ordered.  As patient does not ambulate often and was unresponsive, CT head was also obtained.  She did not have any new deficits with only perhaps a perception of right upper extremity strength  weakness relative to her left slightly more than usual.  Replacing her magnesium which is low at 1.4.        CT head radiology read was of a new possibly evolving acute infarct in the left basal ganglia.  Code stroke was also called and patient was evaluated by Dr. Josue Hector.  Recommends inpatient MRI and to give full-strength aspirin.  Have paged neurologist Dr. Imogene Burn.  He recommends full-strength aspirin and  Plavix after swallow study and inpatient MRI.  Radiologist evaluating CTA head and neck agree that patient has left vertebral artery occlusions chronic but does see the new left sided basal ganglia lucency new since November.    Will admit to hospital.  Medical Decision Making  Amount and/or Complexity of Data Reviewed  Labs: ordered.  Radiology: ordered. Decision-making details documented in ED Course.  ECG/medicine tests: ordered.    Risk  OTC drugs.  Prescription drug management.  Decision regarding hospitalization.      Is this patient to be included in the SEP-1 core measure? Yes SEP-1 CORE MEASURE DATA      Sepsis Criteria   Severe Sepsis Criteria   Septic Shock Criteria       Must meet 2:    [] Temp >100.9 F (38.3 C) or < 96.8 F (36 C)  [] HR > 90  [] RR > 20  [] WBC > 12 or < 4 or 10% bands    AND:    []  Infection Confirmed or Suspected.     Must meet 1:    [] Lactate > 2       or   [] Signs of Organ Dysfunction:    - SBP < 90 or MAP < 65  -Creatinine > 2 or increased from baseline  -Urine Output < 0.5 ml/kg/hr  -Bilirubin > 2  -INR > 1.5 (not anticoagulated)  -Platelets < 100,000  -Acute Respiratory Failure as evidenced by new need for NIPPV or mechanical ventilation   Must meet 1:    [] Lactate > 4        or   [] SBP < 90 or MAP < 65 for at least two readings in the first hour after fluid bolus administration    [] Vasopressors initiated (if hypotension persists after fluid resuscitation)   Patient Vitals for the past 6 hrs:   BP Temp Pulse Resp SpO2 Height Weight Weight Method Percent Weight Change   08/23/22 1308 (!) 195/92 (!) 94.6 F (34.8 C) 53 13 100 % 1.702 m (5\' 7" ) 68 kg (150 lb) Stated 0   08/23/22 1315 (!) 169/90 -- 52 14 100 % -- -- -- --   08/23/22 1330 (!) 155/116 -- 55 -- 100 % -- -- -- --   08/23/22 1347 (!) 153/133 (!) 95 F (35 C) 61 13 100 % -- -- -- --   08/23/22 1503 (!) 163/65 (!) 95.5 F (35.3 C) 62 10 100 % -- -- -- --   08/23/22 1545 (!) 150/59 -- 59 12 99 % -- -- -- --   08/23/22 1600  (!) 150/62 -- 61 12 98 % -- -- -- --   08/23/22 1615 -- (!) 96.5 F (35.8 C) -- -- -- -- -- -- --   08/23/22 1653 (!) 179/67 -- 78 15 100 % -- -- -- --   08/23/22 1704 (!) 171/66 97.1 F (36.2 C) 66 13 100 % -- -- -- --   08/23/22 1715 (!) 165/90 -- 70 10 -- -- -- -- --  Recent Labs     08/23/22  1305   WBC 4.8   CREATININE 0.72   BILITOT 0.5   PLT 238        Severe sepsis not identified.  Abnormal vital signs of for hypothermia and hypoglycemia indications for sepsis.    Fluid Resuscitation Rationale: at least 105mL/kg based on entered actual weight at time of triage    Repeat lactate level: not indicated due to initial lactate < 2    Reassessment Exam: I have reassessed tissue perfusion and hemodynamic status after fluid bolus at this date/time: 08/23/22 at 4:30pm.        REASSESSMENT     ED Course as of 08/23/22 1831   Sat Aug 23, 2022   1654 XR CHEST PORTABLE [JG]   1815 Admit for CVA, hypoglycemia [JM]      ED Course User Index  [JG] Karen Kitchens, PA  [JM] Louretta Parma, DO         CRITICAL CARE TIME   Total Critical Care time was 90 minutes, excluding separately reportable procedures.  There was a high probability of clinically significant/life threatening deterioration in the patient's condition which required my urgent intervention.      CONSULTS:  IP CONSULT TO TELE-NEUROLOGY    PROCEDURES:  Unless otherwise noted below, none     Procedures    FINAL IMPRESSION      1. Altered mental status, unspecified altered mental status type    2. Acute UTI    3. Hypoglycemia    4. Hypothermia, initial encounter    5. Abnormal CT of the head    6. Hypomagnesemia    7. Pressure injury of skin of sacral region, unspecified injury stage          DISPOSITION/PLAN   DISPOSITION Admitted 08/23/2022 06:27:12 PM      PATIENT REFERRED TO:  Will Bonnet, PA  [address]  [place] [address]  [phone]            DISCHARGE MEDICATIONS:  New Prescriptions    No medications on file     Controlled  Substances Monitoring:          No data to display                (Please note that portions of this note were completed with a voice recognition program.  Efforts were made to edit the dictations but occasionally words are mis-transcribed.)    Arvella Merles, PA (electronically signed)  Attending Emergency Physician           Karen Kitchens, PA  08/23/22 1830       Karen Kitchens, PA  08/23/22 1831

## 2022-08-23 NOTE — ED Notes (Signed)
Dinner tray given, daughter at bedside helping her eat.

## 2022-08-23 NOTE — ED Notes (Addendum)
Code stroke called by PA at 1710 due to CT head results. This RN accompanied pt to have CTA done, in scanner now.

## 2022-08-23 NOTE — ED Notes (Signed)
Report called to 3N

## 2022-08-23 NOTE — H&P (Signed)
History and Physical          Subjective     HPI: [name] Short is a 67 y.o. female with a PMHx of arthritis, DMT2, hypertension, hyperlipidemia, sleep apnea noncompliant with CPAP, stroke with right sided weakness who presented to the ED after being found unresponsive for a brief period and then altered by caregiver today. EMS was called and patient BS was 32 upon their arrival. Patient was treated appropriately for hypoglycemia. Patient is noted to have a sacral wound vac in place since ay 2023. Denies being on abx at home. Daughter at bedside who assisted in providing history. Daughter feeding patient at bedside and patient tolerating food without coughing or choking. Currently upon my evaluation, patient resting in no distress, eating dinner. VSS. Receiving IV mag sulfate. Per daughter patient last took her 30 U Lantus at 12:30 AM last night, did not have a bedtime snack. This morning patient had breast at 7 :30 AM and had her SSI. Initially in the morning her BS was 119, but subsequently she came diaphoretic, altered, unresponsive. Patient also has RLE weakness at baseline and uses a walker at home to walk. Today, she felt increased weakness to RLE. Denies any other stroke like symptoms, including drooling, facial droop, numbness or tingling to any extremities, vision changes. Denies associated chest pain, sob, fever, chills, nvd, abdominal pain, headache, cough, urinary symptoms, dizziness. Denies smoking, drinking, illicit drug use.     In the ED, temp 94.6-->97.8, Resp 10-13, HR 50-70, BP 179/81, 100% on RA. Mag 1.2, Lactic acid 2.4--> 0.8. Procal < 0.05. Trop x2 negative. Glucose 49-->127. Chronically elevated liver enzymes. CBC without abnormality. Blood cultures x 2 obtained. Urine culture obtained. CXR negative. EKG with sinus bradycardia. UA with positive nitrite, no WBC. CT head with subacute infarct. CTA head and neck preliminary read with no LVO. Neurology has been consulted by the ED.   Received ASA  325, Ceftriaxone, Plavix, D10, Glucagon, Hydralazine, Mag Sulfate, 2 L NS bolus.     Home meds reconciled with the patient  Code status discussed- FULL code     PMHx:  Past Medical History:   Diagnosis Date    Anemia     Arthritis     Chronic pain     Legs and Shoulders    Diabetes ([place])     Exposure to asbestos     History of blood transfusion     History of colon polyps     HTN (hypertension)     Hx of blood clots     DVT    Hyperlipemia     Menopause     Pulmonary nodule     Serum calcium elevated     Sleep apnea     not using cpap    Stroke [facility])        PSurgHx:  Past Surgical History:   Procedure Laterality Date    BACK SURGERY N/A 06/24/2022    INCISION AND DRAINAGE /DEBRIDEMENT SACRAL DECUBITUS performed by Nestor Ramp, DO at [place]    COLONOSCOPY N/A 01/13/2017    COLONOSCOPY performed by Joycelyn Schmid, MD at [place] N/A 12/28/2021    SACRAL WOUND DEBRIDEMENT INCISION AND DRAINAGE performed by Nestor Ramp, DO at [place] N/A 12/09/2021    CERVICAL THREE/FOUR/FIVE/SIX  LAMINECTOMY FUSION; C-ARM; STRYKER; EXT BONE GROWTH STIM; 23 HR performed by Pamalee Leyden, MD at [facility] MAIN OR       SocialHx:  Social History     Socioeconomic History    Marital status: Single     Spouse name: None    Number of children: None    Years of education: None    Highest education level: None   Tobacco Use    Smoking status: Former     Current packs/day: 0.00     Types: Cigarettes, Cigars     Quit date: 11/13/2021     Years since quitting: 0.7    Smokeless tobacco: Never   Vaping Use    Vaping Use: Never used   Substance and Sexual Activity    Alcohol use: Not Currently     Comment: occ    Drug use: Never     Social Determinants of Health     Financial Resource Strain: Low Risk  (01/05/2022)    Overall Financial Resource Strain (CARDIA)     Difficulty of Paying Living Expenses: Not hard at all    Transportation Needs: No Transportation Needs (08/13/2022)    OASIS A1250: Transportation     Lack of Transportation (Medical): No     Lack of Transportation (Non-Medical): No     Patient Unable or Declines to Respond: No   Physical Activity: Inactive (01/05/2022)    Exercise Vital Sign     Days of Exercise per Week: 0 days     Minutes of Exercise per Session: 0 min   Stress: No Stress Concern Present (01/05/2022)    [employer] of Occupational Health - Occupational Stress Questionnaire     Feeling of Stress : Not at all   Social Connections: Feeling Socially Integrated (08/13/2022)    OASIS D0700: Social Isolation     Frequency of experiencing loneliness or isolation: Never   Intimate Partner Violence: Not At Risk (01/05/2022)    Humiliation, Afraid, Rape, and Kick questionnaire     Fear of Current or Ex-Partner: No     Emotionally Abused: No     Physically Abused: No     Sexually Abused: No   Housing Stability: Low Risk  (01/05/2022)    Housing Stability Vital Sign     Unable to Pay for Housing in the Last Year: No     Number of Places Lived in the Last Year: 1     Unstable Housing in the Last Year: No       FamilyHx:  Family History   Problem Relation Age of Onset    Diabetes Other 45        parent, NOS    Cancer Father     Hypertension Other 35        parent,NOS    Heart Disease Other 2        parent, NOS    Lung Disease Father        Home Medications:  Prior to Admission medications    Medication Sig Start Date End Date Taking? Authorizing Provider   cinacalcet (SENSIPAR) 30 MG tablet Take 1 tablet by mouth 2 times daily 08/13/22  Yes [provider]   hydrALAZINE (APRESOLINE) 10 MG tablet Take 1 tablet by mouth as needed (PRN for SBP > 160) 08/13/22  Yes [provider]   insulin glargine (LANTUS) 100 UNIT/ML injection vial Inject 30 Units into the skin daily 07/02/22   Daine Gravel, MD   famotidine (  PEPCID) 20 MG tablet Take 1 tablet by mouth daily 07/03/22   Daine GravelSaini, Kanwarpreet S, MD    insulin lispro (HUMALOG) 100 UNIT/ML SOLN injection vial Inject 7 Units into the skin 3 times daily (with meals) 07/02/22   Daine GravelSaini, Kanwarpreet S, MD   lactobacillus (CULTURELLE) capsule Take 1 capsule by mouth daily (with breakfast) 07/03/22   Daine GravelSaini, Kanwarpreet S, MD   carvedilol (COREG) 6.25 MG tablet Take 1 tablet by mouth 2 times daily (with meals) 07/02/22   Daine GravelSaini, Kanwarpreet S, MD   baclofen (LIORESAL) 10 MG tablet Take 1 tablet by mouth 3 times daily as needed (PAIN) Indications: Pain FOR PAIN PER SHETH 04/07/22   Pearlean BrownieFetters, Lisa D, PA   Magnesium Oxide 400 MG CAPS Take 1 capsule by mouth daily.    [provider]   aspirin 325 MG tablet Take 1 tablet by mouth daily    [provider]   acetaminophen (TYLENOL) 500 MG tablet Take 1 tablet by mouth every 6 hours as needed for Fever or Pain 01/23/22   Jonah Bluehakral, Vikas, MD   insulin lispro (HUMALOG) 100 UNIT/ML SOLN injection vial Sliding Scale insulin as per Institutional protocol 01/23/22   Jonah Bluehakral, Vikas, MD   atorvastatin (LIPITOR) 80 MG tablet Take 1 tablet by mouth nightly 01/06/22   Loleta Booksalur-Matadha, Basavalinga, MD   ferrous sulfate (IRON 325) 325 (65 Fe) MG tablet Take 1 tablet by mouth daily 01/06/22   Loleta Booksalur-Matadha, Basavalinga, MD   Multiple Vitamins-Minerals (THERAPEUTIC MULTIVITAMIN-MINERALS) tablet Take 1 tablet by mouth daily 01/07/22   Loleta Booksalur-Matadha, Basavalinga, MD   furosemide (LASIX) 40 MG tablet furosemide 40 mg tablet   TAKE 1 TABLET BY MOUTH EVERY DAY 09/23/21   [provider]       Allergies:  Allergies   Allergen Reactions    Lactose Other (See Comments)     Intolerance        Review of Systems:  CONST: no fever or chills, no fatigue, + unresponsive for brief period, + AMS  Eyes: No change in vision, no itching or drainage  ENT: No earache, no tinnitus, no sore throat or sinus congestion.   PULM: No shortness of breath, no cough or wheeze.   CV: no pnd or orthopnea, no CP, no palpitations, no edema  GI: No abdominal pain, no  nausea, no vomiting or diarrhea  GU: No urinary frequency, no urgency, no hesitancy or dysuria.   MSK: No joint or muscle pain, no back pain, no neck pain, no recent trauma.   INTEG: No rash, no itching, no lesions.   ENDO: No polyuria, no polydipsia, no heat or cold intolerance.   HEME: No anemia or easy bruising or bleeding.   NEURO: No headache, no dizziness, no seizures, no numbness, no tingling or weakness.   PSYCH: No anxiety, no depression      Objective     Physical Exam:  Visit Vitals  BP (!) 179/81   Pulse 74   Temp 97.1 F (36.2 C) (Oral)   Resp 12   Ht 1.702 m (5\' 7" )   Wt 68 kg (150 lb)   SpO2 100%   BMI 23.49 kg/m       General: NAD, appears stated age, alert  Skin: warm, dry, no rashes  Eyes: PERRL, sclera is non-icteric  HENT: normocephalic/atraumatic, moist mucus membranes  Respiratory: CTA with no signs of respiratory distress  Cardiovascular: RRR, no m/r/g, no cyanosis + BLE edema (with underlying right leg lymphedema at baseline)  GI: soft, non-tender, normal bowel sounds  Neuro: moves all extremities with increased RLE weakness from baseline, 5/5 to BUE, no facial asymmetry, no focal deficits, normal speech  Psych: appropriate mood and affect, no visual or auditory hallucinations    Laboratory Studies:  Recent Results (from the past 24 hour(s))   POCT Glucose    Collection Time: 08/23/22  1:01 PM   Result Value Ref Range    POC Glucose 63 (L) 70 - 110 mg/dL   CBC with Auto Differential    Collection Time: 08/23/22  1:05 PM   Result Value Ref Range    WBC 4.8 4.6 - 13.2 K/uL    RBC 5.23 4.20 - 5.30 M/uL    Hemoglobin 12.8 12.0 - 16.0 g/dL    Hematocrit 85.6 31.4 - 45.0 %    MCV 78.6 78.0 - 100.0 FL    MCH 24.5 24.0 - 34.0 PG    MCHC 31.1 31.0 - 37.0 g/dL    RDW 97.0 (H) 26.3 - 14.5 %    Platelets 238 135 - 420 K/uL    MPV 10.2 9.2 - 11.8 FL    Nucleated RBCs 0.0 0 PER 100 WBC    nRBC 0.00 0.00 - 0.01 K/uL    Neutrophils % 74 (H) 40 - 73 %    Lymphocytes % 14 (L) 21 - 52 %    Monocytes % 9 3 -  10 %    Eosinophils % 3 0 - 5 %    Basophils % 0 0 - 2 %    Immature Granulocytes 0 0.0 - 0.5 %    Neutrophils Absolute 3.5 1.8 - 8.0 K/UL    Lymphocytes Absolute 0.7 (L) 0.9 - 3.6 K/UL    Monocytes Absolute 0.4 0.05 - 1.2 K/UL    Eosinophils Absolute 0.2 0.0 - 0.4 K/UL    Basophils Absolute 0.0 0.0 - 0.1 K/UL    Absolute Immature Granulocyte 0.0 0.00 - 0.04 K/UL    Differential Type AUTOMATED     CMP    Collection Time: 08/23/22  1:05 PM   Result Value Ref Range    Sodium 144 136 - 145 mmol/L    Potassium 3.6 3.5 - 5.5 mmol/L    Chloride 108 100 - 111 mmol/L    CO2 33 (H) 21 - 32 mmol/L    Anion Gap 3 3.0 - 18 mmol/L    Glucose 49 (LL) 74 - 99 mg/dL    BUN 12 7.0 - 18 MG/DL    Creatinine 7.85 0.6 - 1.3 MG/DL    Bun/Cre Ratio 17 12 - 20      Est, Glom Filt Rate >60 >60 ml/min/1.32m2    Calcium 9.0 8.5 - 10.1 MG/DL    Total Bilirubin 0.5 0.2 - 1.0 MG/DL    ALT 58 (H) 13 - 56 U/L    AST 55 (H) 10 - 38 U/L    Alk Phosphatase 135 (H) 45 - 117 U/L    Total Protein 7.0 6.4 - 8.2 g/dL    Albumin 3.1 (L) 3.4 - 5.0 g/dL    Globulin 3.9 2.0 - 4.0 g/dL    Albumin/Globulin Ratio 0.8 0.8 - 1.7     Troponin    Collection Time: 08/23/22  1:05 PM   Result Value Ref Range    Troponin, High Sensitivity 9 0 - 54 ng/L   Magnesium    Collection Time: 08/23/22  1:05 PM   Result Value Ref Range  Magnesium 1.2 (L) 1.6 - 2.6 mg/dL   Procalcitonin    Collection Time: 08/23/22  1:05 PM   Result Value Ref Range    Procalcitonin <0.05 ng/mL   Protime-INR    Collection Time: 08/23/22  1:05 PM   Result Value Ref Range    Protime 13.7 11.9 - 14.7 sec    INR 1.0 0.9 - 1.1     EKG 12 Lead    Collection Time: 08/23/22  1:20 PM   Result Value Ref Range    Ventricular Rate 53 BPM    Atrial Rate 53 BPM    P-R Interval 130 ms    QRS Duration 78 ms    Q-T Interval 498 ms    QTc Calculation (Bazett) 467 ms    P Axis 69 degrees    R Axis 13 degrees    T Axis -3 degrees    Diagnosis       Sinus bradycardia  Septal infarct (cited on or before  17-Aug-2022)  Abnormal ECG  When compared with ECG of 17-Aug-2022 19:46,  No significant change was found     POCT Glucose    Collection Time: 08/23/22  1:48 PM   Result Value Ref Range    POC Glucose 72 70 - 110 mg/dL   Lactate, Sepsis    Collection Time: 08/23/22  2:07 PM   Result Value Ref Range    Lactic Acid, Sepsis 2.4 (HH) 0.4 - 2.0 MMOL/L   POCT Glucose    Collection Time: 08/23/22  2:17 PM   Result Value Ref Range    POC Glucose 148 (H) 70 - 110 mg/dL   POCT Glucose    Collection Time: 08/23/22  3:32 PM   Result Value Ref Range    POC Glucose 163 (H) 70 - 110 mg/dL   Lactate, Sepsis    Collection Time: 08/23/22  4:22 PM   Result Value Ref Range    Lactic Acid, Sepsis 0.8 0.4 - 2.0 MMOL/L   Urinalysis    Collection Time: 08/23/22  4:47 PM   Result Value Ref Range    Color, UA YELLOW      Appearance CLOUDY      Specific Gravity, UA 1.009 1.005 - 1.030      pH, Urine 7.0 5.0 - 8.0      Protein, UA 30 (A) NEG mg/dL    Glucose, UA Negative NEG mg/dL    Ketones, Urine Negative NEG mg/dL    Bilirubin Urine Negative NEG      Blood, Urine Negative NEG      Urobilinogen, Urine 0.2 0.2 - 1.0 EU/dL    Nitrite, Urine Positive (A) NEG      Leukocyte Esterase, Urine Negative NEG     Urinalysis, Micro    Collection Time: 08/23/22  4:47 PM   Result Value Ref Range    WBC, UA 0 to 1 0 - 4 /hpf    RBC, UA 0 to 1 0 - 5 /hpf    Epithelial Cells UA 1+ 0 - 5 /lpf    BACTERIA, URINE 2+ (A) NEG /hpf   Troponin    Collection Time: 08/23/22  5:04 PM   Result Value Ref Range    Troponin, High Sensitivity 9 0 - 54 ng/L   POCT Glucose    Collection Time: 08/23/22  5:54 PM   Result Value Ref Range    POC Glucose 127 (H) 70 - 110 mg/dL       Imaging  Reviewed:  CTA HEAD NECK W WO CONTRAST    Result Date: 08/23/2022  EXAM: CTA HEAD NECK W WO CONTRAST PROVIDED REASON FOR EXAM: "Infarct eval" STROKE ALERT PRELIMINARY REPORT PRIOR TO POSTPROCESSING: Patent anterior intracranial brain arteries. No LVO. Patent cervical carotid arteries. Chronic  occlusion of the cervical and intradural left vertebral artery. Patent intradural RVA, basilar, and PCAs. Moderate stenosis in the distal left ICA. Called to ED PA Galyo at 555pm 08-23-22    CT Head W/O Contrast    Result Date: 08/23/2022  NONCONTRAST HEAD CT INDICATION: Above.  Altered mental status. COMPARISON: 06/20/2022 TECHNIQUE: Serial axial CT images through the brain were obtained without administration of intravenous contrast. Additional coronal and sagittal reformation images were also performed. All CT scans at this facility are performed using dose optimization technique as appropriate to the performed exam, to include automated exposure control, adjustment of the mA and/or kV according to patient's size (Including appropriate matching for site-specific examinations), or use of iterative reconstruction technique. FINDINGS: There is mild diffuse prominence of the cortical sulci and proportional global ventricular prominencecompatible with cerebral volume loss, not grossly remarkable for the patient's age.  There is no evidence of acute intracranial hemorrhage. Previously described small hypodensities consistent with chronic infarcts in the left cerebellum and pons again noted. Mild deep periventricular white matter hypoattenuation consistent with mild burden of chronic white matter microvascular ischemic changes. There is a vaguely rounded region of more subtle decreased attenuation in the left basal ganglia involving the region of the caudate head and anterior limb of the internal capsule extending to the subinsular region, new compared to prior, suspicious for edema which may reflect a subacute/evolving infarct. No evidence of associated midline shift or other significant mass effect. No mass lesion identified.  The visualized portions of the paranasal sinuses demonstrate no significant mucosal pathology.   The mastoid air cells are aerated  The calvarium appears intact.     Hypoattenuation in the left  cerebrum involving the deep periventricular white matter and adjacent basal ganglia, new compared to the head CT from 06/20/2022, suspicious for edema which may be a subacute/evolving infarct. Previously described small hypodensities consistent with chronic infarcts in the left cerebellum and pons again seen.  Other nonacute appearing findings as above. I have telephoned a wet reading directly to  PA Waldo LaineJana Galyo at 5:00 PM on 08/23/2022.     XR CHEST PORTABLE    Result Date: 08/23/2022  AP CHEST, PORTABLE INDICATION: Above. Altered mental status. COMPARISON: 08/17/2022. TECHNIQUE: Portable AP chest radiograph is reviewed.  Semiupright. FINDINGS: EKG leads/wires overlie the patient. Partially visualized cervical spine fusion hardware again noted before sending cephalad to the field-of-view. Oblong metallic density partially visualized overlying the right axillary region may be related to lateral device which was better visualized on the prior study, query neurostimulator. Mild prominence of the interstitial markings. No evidence of focal pulmonary consolidation or overt alveolar edema.  No evidence of pneumothorax.  The costophrenic sulci appear sharp.  The cardiac silhouette is borderline prominent.     Mild prominence of the interstitial markings without evidence of focal pulmonary consolidation. Borderline cardiomegaly.           Assessment/Plan       Hospital Problems             Last Modified POA    * (Principal) AMS (altered mental status) 08/23/2022 Yes    Obstructive sleep apnea syndrome 08/23/2022 Yes    Diabetes mellitus, type 2 (HCC) 08/23/2022  Yes    Decubitus ulcer of sacral region, stage 4 (HCC) 08/23/2022 Yes    Acute CVA (cerebrovascular accident) (HCC) 08/23/2022 Yes    Severe sepsis (HCC) 08/23/2022 Yes    Hypertension 08/23/2022 Yes    History of CVA (cerebrovascular accident) 08/23/2022 Yes    Hyperlipidemia 08/23/2022 Yes    Hypothermia 08/23/2022 Yes    Hypoglycemia 08/23/2022 Yes    Hypomagnesemia 08/23/2022  Yes    Acute cystitis 08/23/2022 Yes       Assessment:  Severe Sepsis 2/2 Acute Cystitis vs very likely Sacral Wound Infection - lactic acidosis, hypothermia, procal  < 0.05, wbc wnl   Subacute Infarct - per CT head, CTA preliminary without LVO   Hypothermia POA- resolved s/p bair hugger   Hypoglycemia POA- resolved s/p D10 and glucagon in the ED   Hypomagnesemia - 1.2   Sacral Ulcer with wound Vac since May 2023  Recent COVID 19 infection in December 2023  DMT2  Hypertension  History of CVA with right sided weakness  Hyperlipidemia  Sleep apnea noncompliant with CPAP   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 with external bone growth stimulator placement on Dec 09, 2021       Plan:  - IV Abx- Vanc and Zosyn  - Follow blood/urine cultures   - Mag replaced with 4 g, recheck in the AM   - MRI brain  - Echo with bubble study  - Cardiac monitoring  - ASA/Statin/Plavix  - Permissive HTN <220/110; PRN labetalol  - ST/PT/OT  - Lipid panel, a1c, TSH  - IV fluids - NS 100 ml/hr   - Holding home antihypertensives to allow for permissive HTN (Coreg)   - Holding home lasix while hydrating right now for sepsis   - check A1c  - Check glucose q4h or as needed if signs of hypoglycemia noted   - Holding home Lantus in light of hypoglycemia upon arrival   - Resume appropriate home meds- ASA, Statin, Pepcid, Sinsipar, Iron   - Wound care consulted for sacral wound drsg change   - Neurology consulted- appreciate assistance   - Consider ID consult tomorrow If needed     PT/OT/IS     Anticipated Discharge: 2 days     DVT Prophylaxis:  [] Lovenox  [] Hep SQ  [x] SCDs  [] Coumadin [] DOAC  [] On Heparin gtt     I have personally reviewed all pertinent labs, films and EKGs that have officially resulted. I reviewed available electronic documentation outlining the initial presentation as well as the emergency room physician's encounter.    Time spent reviewing records, independently interpreting results,  obtaining history from patient or caregiver, performing physical exam, ordering tests and medications, communicating with specialists, documenting in the chart, and coordinating overall care is  >75 minutes     , FNP-C  [facility]  Hospitalist Division  Office:  [phone]

## 2022-08-23 NOTE — Plan of Care (Signed)
Problem: SLP Adult - Impaired Swallowing  Goal: By Discharge: Advance to least restrictive diet without signs or symptoms of aspiration for planned discharge setting.  See evaluation for individualized goals.  Outcome: Adequate for Discharge     [facility] EVALUATION AND DISCHARGE    Patient: [name] Short (67 y.o. female)  Date: 08/23/2022  Primary Diagnosis: Hypomagnesemia [E83.42]  Hypoglycemia [E16.2]  Abnormal CT of the head [R93.0]  Acute UTI [N39.0]  Hypothermia, initial encounter [T68.XXXA]  Altered mental status, unspecified altered mental status type [R41.82]  Cerebrovascular accident (CVA), unspecified mechanism ([place]) [I63.9]  Pressure injury of skin of sacral region, unspecified injury stage [L89.159]  AMS (altered mental status) [R41.82]       Precautions: Aspiration   PLOF: As per H&P    ASSESSMENT:  Pt in bed, adjusted by SLP. Pt's family @ bedside for evaluation. MD was at bedside at start of evaluation. Pt with natural teeth, needing feeding A. d  Based on the objective data described below, the patient presents with oropharyngeal swallow function essentially WFL. Strength, ROM, and coordination of the orofacial musculature were all found to be [facility]. Pt tolerated reg solid, puree, and thin liquids +/- straw consecutive swallows without any overt signs/symptoms of aspiration. Mastication was appropriate with timely a-p transit. Positive oral clearance observed across all trials. Pt safe for initiation of reg solid diet with thin liquids, aspiration precautions, oral care TID, and meds as tolerated. No further skilled SLP services are indicated at this time. Pt will feeding assistance, refer to OT/PT. Please re-consult if needed.      PLAN:  Recommendations and Planned Interventions:  No formal ST needs identified for dysphagia. Eval only.   Discharge Recommendations: D/C Recommendations: No follow up therapy recommended post discharge     SUBJECTIVE:   Patient stated "I  can eat everything".    OBJECTIVE:     Past Medical History:   Diagnosis Date    Anemia     Arthritis     Chronic pain     Legs and Shoulders    Diabetes ([place])     Exposure to asbestos     History of blood transfusion     History of colon polyps     HTN (hypertension)     Hx of blood clots     DVT    Hyperlipemia     Menopause     Pulmonary nodule     Serum calcium elevated     Sleep apnea     not using cpap    Stroke [facility])      Past Surgical History:   Procedure Laterality Date    BACK SURGERY N/A 06/24/2022    INCISION AND DRAINAGE /DEBRIDEMENT SACRAL DECUBITUS performed by Nestor Ramp, DO at [place]    COLONOSCOPY N/A 01/13/2017    COLONOSCOPY performed by Joycelyn Schmid, MD at [place] N/A 12/28/2021    SACRAL WOUND DEBRIDEMENT INCISION AND DRAINAGE performed by Nestor Ramp, DO at [place] N/A 12/09/2021    CERVICAL THREE/FOUR/FIVE/SIX LAMINECTOMY FUSION; C-ARM; STRYKER; EXT BONE GROWTH STIM; 23 HR performed by Candi Leash, MD at [place]     Prior Level of Function/Home Situation: Home    Daily Assessment:  Baseline Assessment  Temperature Spikes  Noted: No  Communication Observation: Functional  Follows Directions: Complex  Current Diet : NPO  Current Liquid Diet : NPO  Dentition: Adequate, Some missing teeth  Patient Positioning: Upright in bed  Baseline Vocal Quality: Normal  Volitional Cough: Strong    Orientation:  Person, Place, Time, and Situation    Oral Assessment:  Oral Motor   Labial: No impairment  Dentition: Edentulous  Oral Hygiene: Clean  Lingual: No impairment  Velum: No Impairment  Mandible: No impairment  Gag: No Impairment  Pharyngeal Phase: WFL       PAIN:  Start of Eval: 0  End of Eval: 0     After evaluation:   []             Patient left in no apparent distress sitting up in chair  [x]             Patient left in no apparent distress in bed  [x]              Call bell left within reach  [x]             Nursing notified  [x]             Family present  []             Caregiver present  []             Bed alarm activated      COMMUNICATION/EDUCATION:   [x]             Aspiration precautions; swallow safety; compensatory techniques provided via demonstration, verbalization and teach back of comprehension  [x]          Patient/family have participated as able in goal setting and plan of care.  []             Patient/family agree to work toward stated goals and plan of care.  []             Patient understands intent and goals of therapy, neutral about participation.  []             Patient unable to participate in goal setting/plan of care secondary to cognition, hearing/vision deficits; education ongoing with interdisciplinary staff   []             Handout regarding diet recommendations and thickener instructions provided.  []          Posted safety precautions in patient's room.    Thank you for this referral.    [place] J. Melyna Huron MA CCC-SLP

## 2022-08-23 NOTE — ED Notes (Signed)
Report given to oncoming nurse.

## 2022-08-23 NOTE — ED Notes (Signed)
Pt rectal temp 94.93F. PA made aware.     Pt cleaned due to saturated brief, linens changed, and purewick applied. Bair hugger put on 38C.

## 2022-08-23 NOTE — ED Triage Notes (Signed)
Pt arrived via EMS for hypoglycemia. [place] CNA called due to pt being 'unconscious'. When EMS arrived, pt not alert, but is awake, BS 32. Pt received 12ml D10, BG 203.

## 2022-08-23 NOTE — ED Notes (Signed)
Bairhugger taken off and rectal temp probe removed, temp WNL, 97.39F.

## 2022-08-23 NOTE — Assessment & Plan Note (Signed)
Patient currently not interested in COVID vaccine. Will continue to discuss this at subsequent appointments.

## 2022-08-23 NOTE — Assessment & Plan Note (Addendum)
Her blood sugars are well controlled on synjardy. A1c 6.2. Continue synjardy. Her microalb/cr is WNL. She follows regularly with podiatry and will get those records faxed to [place].

## 2022-08-23 NOTE — Assessment & Plan Note (Signed)
Patient on metoprolol succinate '50mg'$  qd, spironolactone '25mg'$  qd, enalapril '5mg'$  qd, and lasix '40mg'$ . As well as empagliflozin.  -She is euvolemic, she will reach out to Dr. Harrell Gave to get an appointment

## 2022-08-23 NOTE — Assessment & Plan Note (Signed)
Patient has not been working with OT recently but she does exercises on her own. She is able to ambulate with the assistance of her cane and walker. Her son is looking at getting her re-established with PT/OT to further improve her strength.

## 2022-08-23 NOTE — Progress Notes (Signed)
   CC: F/u T2DM  HPI:  [name] is a 67 y.o. F with PMH per below who presents for f/u of her chronic medical problems. Please see problem based charting under encounters tab for further details.    Past Medical History:  Diagnosis Date   Cervical disc disease with myelopathy    CHF (congestive heart failure) (HCC)    Chronic headaches    DJD (degenerative joint disease)    DM (diabetes mellitus) ([place]) 08/04/2006   stable HgBA1C at 6.5   GERD (gastroesophageal reflux disease)    well controlled on Omeprazole   HLD (hyperlipidemia) 08/04/2006   stable, well controlled   Hypertension    well controlled   Review of Systems:  Please see problem based charting under encounters tab for further details.    Physical Exam:  Vitals:   08/20/22 1613  BP: 121/80  Pulse: 82  Temp: 98.1 F (36.7 C)  TempSrc: Oral  SpO2: 100%  Weight: 209 lb 11.2 oz (95.1 kg)  Height: '5\' 3"'$  (1.6 m)    Constitutional: Well-developed, well-nourished, seated in wheelchair, and in no distress.  Cardiovascular: Normal rate, regular rhythm, intact distal pulses. No gallop and no friction rub.  No murmur heard. No lower extremity edema  Pulmonary: Non labored breathing on room air, no wheezing or rales  Abdominal: Soft. Normal bowel sounds. Non distended and non tender Musculoskeletal: Normal range of motion.        General: No tenderness or edema.  Neurological: Alert and oriented to person, place, and time.  Skin: Skin is warm and dry.    Assessment & Plan:   See Encounters Tab for problem based charting.  Patient discussed with Dr. Heber [place]

## 2022-08-23 NOTE — Assessment & Plan Note (Addendum)
Patient would like to trial wellbutrin again to help with this.

## 2022-08-24 LAB — RESPIRATORY PANEL, MOLECULAR, WITH COVID-19
Adenovirus by PCR: NOT DETECTED
Bordetella parapertussis by PCR: NOT DETECTED
Bordetella pertussis by PCR: NOT DETECTED
Chlamydophila Pneumonia PCR: NOT DETECTED
Coronavirus 229E by PCR: NOT DETECTED
Coronavirus HKU1 by PCR: NOT DETECTED
Coronavirus NL63 by PCR: NOT DETECTED
Coronavirus OC43 by PCR: NOT DETECTED
Human Metapneumovirus by PCR: NOT DETECTED
Influenza A by PCR: NOT DETECTED
Influenza B PCR: NOT DETECTED
Mycoplasma pneumo by PCR: NOT DETECTED
Parainfluenza 1 PCR: NOT DETECTED
Parainfluenza 2 PCR: NOT DETECTED
Parainfluenza 3 PCR: NOT DETECTED
Parainfluenza 4 PCR: NOT DETECTED
Respiratory Syncytial Virus by PCR: NOT DETECTED
Rhinovirus Enterovirus PCR: NOT DETECTED
SARS-CoV-2, PCR: DETECTED — AB

## 2022-08-24 LAB — CBC
Hematocrit: 33.7 % — ABNORMAL LOW (ref 35.0–45.0)
Hemoglobin: 10.5 g/dL — ABNORMAL LOW (ref 12.0–16.0)
MCH: 24.3 pg (ref 24.0–34.0)
MCHC: 31.2 g/dL (ref 31.0–37.0)
MCV: 78 FL (ref 78.0–100.0)
MPV: 10.2 FL (ref 9.2–11.8)
Nucleated RBCs: 0 /100{WBCs}
Platelets: 218 10*3/uL (ref 135–420)
RBC: 4.32 M/uL (ref 4.20–5.30)
RDW: 15.9 % — ABNORMAL HIGH (ref 11.6–14.5)
WBC: 4.9 10*3/uL (ref 4.6–13.2)
nRBC: 0 10*3/uL (ref 0.00–0.01)

## 2022-08-24 LAB — BASIC METABOLIC PANEL W/ REFLEX TO MG FOR LOW K
Anion Gap: 4 mmol/L (ref 3.0–18)
BUN/Creatinine Ratio: 17 (ref 12–20)
BUN: 13 mg/dL (ref 7.0–18)
CO2: 29 mmol/L (ref 21–32)
Calcium: 8.4 mg/dL — ABNORMAL LOW (ref 8.5–10.1)
Chloride: 110 mmol/L (ref 100–111)
Creatinine: 0.75 mg/dL (ref 0.6–1.3)
Est, Glom Filt Rate: >60 mL/min/{1.73_m2} (ref >60)
Glucose: 160 mg/dL — ABNORMAL HIGH (ref 74–99)
Potassium: 2.7 mmol/L — CL (ref 3.5–5.5)
Sodium: 143 mmol/L (ref 136–145)

## 2022-08-24 LAB — POCT GLUCOSE
POC Glucose: 138 mg/dL — ABNORMAL HIGH (ref 70–110)
POC Glucose: 148 mg/dL — ABNORMAL HIGH (ref 70–110)
POC Glucose: 161 mg/dL — ABNORMAL HIGH (ref 70–110)

## 2022-08-24 LAB — EKG 12-LEAD
Atrial Rate: 53 {beats}/min
P Axis: 69 degrees
P-R Interval: 130 ms
Q-T Interval: 498 ms
QRS Duration: 78 ms
QTc Calculation (Bazett): 467 ms
R Axis: 13 degrees
T Axis: -3 degrees
Ventricular Rate: 53 {beats}/min

## 2022-08-24 LAB — LIPID PANEL
Chol/HDL Ratio: 2.8 (ref 0–5.0)
Cholesterol, Total: 119 mg/dL (ref <200)
HDL: 42 mg/dL (ref 40–60)
LDL Calculated: 66.4 mg/dL (ref 0–100)
Triglycerides: 53 mg/dL (ref <150)
VLDL Cholesterol Calculated: 10.6 mg/dL

## 2022-08-24 LAB — POTASSIUM: Potassium: 3.8 mmol/L (ref 3.5–5.5)

## 2022-08-24 LAB — HEMOGLOBIN A1C
Estimated Avg Glucose: 151 mg/dL
Estimated Avg Glucose: 151 mg/dL
Hemoglobin A1C: 6.9 % — ABNORMAL HIGH (ref 4.2–5.6)
Hemoglobin A1C: 6.9 % — ABNORMAL HIGH (ref 4.2–5.6)

## 2022-08-24 LAB — TSH: TSH, 3rd Generation: 0.57 u[IU]/mL (ref 0.36–3.74)

## 2022-08-24 LAB — MAGNESIUM
Magnesium: 1.6 mg/dL (ref 1.6–2.6)
Magnesium: 1.3 mg/dL — ABNORMAL LOW (ref 1.6–2.6)

## 2022-08-24 LAB — PHOSPHORUS: Phosphorus: 2.1 mg/dL — ABNORMAL LOW (ref 2.5–4.9)

## 2022-08-24 MED ORDER — SODIUM CHLORIDE 0.9 % IV SOLN (MINI-BAG)
0.9 % | Freq: Once | INTRAVENOUS | Status: AC
Start: 2022-08-24 — End: 2022-08-23
  Administered 2022-08-24: 01:00:00 4500 mg via INTRAVENOUS

## 2022-08-24 MED ORDER — PIPERACILLIN SOD-TAZOBACTAM SO 3.375 (3-0.375) G IV SOLR
3.375 (3-0.375) g | Freq: Three times a day (TID) | INTRAVENOUS | Status: AC
Start: 2022-08-24 — End: 2022-08-24
  Administered 2022-08-24 (×2): 3375 mg via INTRAVENOUS

## 2022-08-24 MED ORDER — MAGNESIUM OXIDE -MG SUPPLEMENT 400 (240 MG) MG PO TABS
400 (240 Mg) MG | Freq: Every day | ORAL | Status: DC
Start: 2022-08-24 — End: 2022-08-25
  Administered 2022-08-24 – 2022-08-25 (×2): 400 mg via ORAL

## 2022-08-24 MED ORDER — POTASSIUM BICARB-CITRIC ACID 20 MEQ PO TBEF
20 MEQ | Freq: Four times a day (QID) | ORAL | Status: AC
Start: 2022-08-24 — End: 2022-08-24
  Administered 2022-08-24 – 2022-08-25 (×3): 40 meq via ORAL

## 2022-08-24 MED ORDER — VIAL-MATE ADAPTOR
1 g | Freq: Two times a day (BID) | INTRAVENOUS | Status: AC
Start: 2022-08-24 — End: 2022-08-24
  Administered 2022-08-24: 14:00:00 1000 mg via INTRAVENOUS

## 2022-08-24 MED ORDER — FERROUS SULFATE 325 (65 FE) MG PO TABS
325 (65 Fe) MG | Freq: Every day | ORAL | Status: AC
Start: 2022-08-24 — End: 2022-08-25
  Administered 2022-08-24 – 2022-08-25 (×2): 325 mg via ORAL

## 2022-08-24 MED ORDER — SULFAMETHOXAZOLE-TRIMETHOPRIM 400-80 MG PO TABS
400-80 MG | Freq: Two times a day (BID) | ORAL | Status: DC
Start: 2022-08-24 — End: 2022-08-25
  Administered 2022-08-25 (×2): 2 via ORAL

## 2022-08-24 MED ORDER — BACLOFEN 10 MG PO TABS
10 MG | Freq: Three times a day (TID) | ORAL | Status: DC | PRN
Start: 2022-08-24 — End: 2022-08-25
  Administered 2022-08-24 – 2022-08-25 (×3): 5 mg via ORAL

## 2022-08-24 MED ORDER — CINACALCET HCL 60 MG PO TABS
60 MG | Freq: Two times a day (BID) | ORAL | Status: AC
Start: 2022-08-24 — End: 2022-08-25
  Administered 2022-08-24 – 2022-08-25 (×4): 30 mg via ORAL

## 2022-08-24 MED ORDER — SODIUM CHLORIDE 0.9 % IV SOLN
0.9 % | Freq: Once | INTRAVENOUS | Status: AC
Start: 2022-08-24 — End: 2022-08-23
  Administered 2022-08-24: 02:00:00 1500 mg via INTRAVENOUS

## 2022-08-24 MED ORDER — SODIUM CHLORIDE 0.9 % IV SOLN
0.9 % | Freq: Once | INTRAVENOUS | Status: AC
Start: 2022-08-24 — End: 2022-08-24
  Administered 2022-08-24: 20:00:00 15 mmol via INTRAVENOUS

## 2022-08-24 MED FILL — SENSIPAR 60 MG PO TABS: 60 MG | ORAL | Qty: 1

## 2022-08-24 MED FILL — BACLOFEN 10 MG PO TABS: 10 MG | ORAL | Qty: 1

## 2022-08-24 MED FILL — POTASSIUM PHOSPHATES 15 MMOLE/5ML IV SOLN: 15 MMOLE/5ML | INTRAVENOUS | Qty: 5

## 2022-08-24 MED FILL — VANCOMYCIN HCL 1 G IV SOLR: 1 g | INTRAVENOUS | Qty: 1000

## 2022-08-24 MED FILL — PIPERACILLIN SOD-TAZOBACTAM SO 3.375 (3-0.375) G IV SOLR: 3.375 (3-0.375) g | INTRAVENOUS | Qty: 3375

## 2022-08-24 MED FILL — SODIUM CHLORIDE 0.9 % IV SOLN: 0.9 % | INTRAVENOUS | Qty: 1000

## 2022-08-24 MED FILL — PIPERACILLIN SOD-TAZOBACTAM SO 4.5 (4-0.5) G IV SOLR: 4.5 (4-0.5) g | INTRAVENOUS | Qty: 4500

## 2022-08-24 MED FILL — CLOPIDOGREL BISULFATE 75 MG PO TABS: 75 MG | ORAL | Qty: 1

## 2022-08-24 MED FILL — CINACALCET HCL 60 MG PO TABS: 60 MG | ORAL | Qty: 0.5

## 2022-08-24 MED FILL — ATORVASTATIN CALCIUM 40 MG PO TABS: 40 MG | ORAL | Qty: 2

## 2022-08-24 MED FILL — FAMOTIDINE 20 MG PO TABS: 20 MG | ORAL | Qty: 1

## 2022-08-24 MED FILL — CINACALCET HCL 30 MG PO TABS: 30 MG | ORAL | Qty: 1

## 2022-08-24 MED FILL — MAGNESIUM OXIDE -MG SUPPLEMENT 400 (240 MG) MG PO TABS: 400 (240 Mg) MG | ORAL | Qty: 1

## 2022-08-24 MED FILL — ASPIRIN ADULT 325 MG PO TABS: 325 MG | ORAL | Qty: 1

## 2022-08-24 MED FILL — EFFER-K 20 MEQ PO TBEF: 20 MEQ | ORAL | Qty: 2

## 2022-08-24 MED FILL — FERROUS SULFATE 325 (65 FE) MG PO TABS: 325 (65 Fe) MG | ORAL | Qty: 1

## 2022-08-24 NOTE — Plan of Care (Signed)
Problem: Safety - Adult  Goal: Free from fall injury  Outcome: Progressing     Problem: Discharge Planning  Goal: Discharge to home or other facility with appropriate resources  Outcome: Progressing     Problem: Pain  Goal: Verbalizes/displays adequate comfort level or baseline comfort level  Outcome: Progressing     Problem: Chronic Conditions and Co-morbidities  Goal: Patient's chronic conditions and co-morbidity symptoms are monitored and maintained or improved  Outcome: Progressing     Problem: Physical Therapy - Adult  Goal: By Discharge: Performs mobility at highest level of function for planned discharge setting.  See evaluation for individualized goals.  Description: Physical Therapy Goals:  Initiated 08/24/2022 to be met within 7-10 days.    1.  Patient will move from supine to sit and sit to supine  in bed with supervision/set-up.    2.  Patient will transfer from bed to chair and chair to bed with minimal assistance/contact guard assist using the least restrictive device.  3.  Patient will perform sit to stand with modified independence.  4.  Patient will ambulate with minimal assistance/contact guard assist for 50 feet with the least restrictive device.     PLOF: lives with daughter, 1 story, level entry, uses walker     08/24/2022 1452 by Jefm Miles, PT  Outcome: Progressing     Problem: Occupational Therapy - Adult  Goal: By Discharge: Performs self-care activities at highest level of function for planned discharge setting.  See evaluation for individualized goals.  Description: Occupational Therapy Goals:  Initiated 08/24/2022 to be met within 7-10 days.    1.  Patient will perform bathing with modified independence.   2.  Patient will perform lower body dressing with modified independence.  3.  Patient will perform grooming  with modified independence standing at sink >5 minutes.  4.  Patient will perform toilet transfers with modified independence.  5.  Patient will perform all aspects of toileting  with modified independence.  6.  Patient will participate in upper extremity therapeutic exercise/activities with modified independence for 8-10 minutes to increase strength/endurance for ADLs.    7.  Patient will utilize energy conservation techniques during functional activities with verbal and visual cues.    PLOF: Pt lives in a 1 story home with daughter. Independent in all self care tasks.     08/24/2022 1531 by Alma Friendly, OT  Outcome: Progressing     Problem: Nutrition Deficit:  Goal: Optimize nutritional status  Outcome: Progressing     Problem: Skin/Tissue Integrity  Goal: Absence of new skin breakdown  Description: 1.  Monitor for areas of redness and/or skin breakdown  2.  Assess vascular access sites hourly  3.  Every 4-6 hours minimum:  Change oxygen saturation probe site  4.  Every 4-6 hours:  If on nasal continuous positive airway pressure, respiratory therapy assess nares and determine need for appliance change or resting period.  Outcome: Progressing

## 2022-08-24 NOTE — Progress Notes (Addendum)
0700: Bedside and Verbal shift change report given to Gregary Signs, Therapist, sports (oncoming nurse) by Renold Don, RN (offgoing nurse). Report included the following information Nurse Handoff Report, Index, Adult Overview, Intake/Output, MAR, Recent Results, Cardiac Rhythm NSR, Alarm Parameters, and Quality Measures.      4696: NIHSS completed at bedside with Amica, RN, pt score 1.    0830: Dr. Bridgett Larsson (neuro) at bedside with pt.      1200: Pt working with PT/OT. Neuro check complete, no changes. Wound vac continuous at 125 suction. Intact, does not need to be changed at this time.    1300: Dr. Hulen Skains at bedside, RN present.  Possible Discharge tomorrow. Daughter Jiles Garter made aware.    1900: Bedside and Verbal shift change report given to Renold Don, Therapist, sports (Soil scientist) by Gregary Signs, RN (offgoing nurse). Report included the following information Nurse Handoff Report, Index, Adult Overview, Intake/Output, Cardiac Rhythm NSR, and Alarm Parameters.      Daughter at bedside upon change of shift.   BG 301. Post dinner.

## 2022-08-24 NOTE — Progress Notes (Signed)
Advance Care Planning     Advance Care Planning Inpatient Note  Spiritual Care Department    Today's Date: 08/24/2022  Unit: [facility] 3 NORTH TELEMETRY    Received request from admission screening.  Upon review of chart and communication with care team, request Health Care Provider's clarification of patient's decision making capacity.. Patient was/were present in the room during visit.    Health Care Decision Makers:       Primary Decision Maker: [name] Short, [name] Short - Child - [phone]    Secondary Decision Maker: Barbaraann Barthel [phone]  Summary:  Verified Documents      Advance Care Planning Documents (Patient Wishes):  Healthcare Power of Attorney/Advance Directive Appointment of [place]  Living Will/Advance Directive     Assessment:       08/24/22 1442   Encounter Summary   Encounter Overview/Reason  Advance Care Planning   Service Provided For: Patient   Referral/Consult From: Multi-disciplinary team   Support System Family members   Last Encounter  08/24/22  ([id number])   Complexity of Encounter Moderate   Begin Time 1440   End Time  1445   Total Time Calculated 5 min   Spiritual/Emotional needs   Type Spiritual Support   Assessment/Intervention/Outcome   Assessment Unable to assess   Intervention Other (Comment)   Outcome Did not respond   Plan and Referrals   Plan/Referrals Continue to visit, (comment)       Interventions:  Reviewed but did not complete ACP document    Care Preferences Communicated:   No    Outcomes/Plan:  ACP Discussion: Completed    Electronically signed by Salvadore Dom, [place] on 08/24/2022 at 2:44 PM

## 2022-08-24 NOTE — Progress Notes (Signed)
Progress Note    Patient: [name] Short MRN: [id number]  CSN: [id number]    Date of Birth: Mar 28, 1956  Age: 67 y.o.  Sex: female      DOA: 08/23/2022  CC: AMS    PCP: Will Bonnet, PA       HPI:     [name] Short is a 66 y.o. female who presented to [facility] on 1/20 with AMS secondary to hypoglycemia. She had a stroke workup which was negative for acute stroke. Neurology felt she could d/c plavix. At the time she was hypothermic with lactic acidosis and sepsis protocol was initiated w/ UTI as source. Her procal turned up negative and patient's WBC were stable, sepsis r/o this admission. Hypothermia attributed to hypoglycemia. 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 Her A1C was 6.9 on labs. Her labs were also significant for hypokalemia, hypophosphatemia, and hypomagnesmia. Lasix was held her admission and lytes replaced.     08/24/22  No complaints  Drinking her potassium drink      Past Medical History:   Diagnosis Date    Anemia     Arthritis     Chronic pain     Legs and Shoulders    Diabetes (HCC)     Exposure to asbestos     History of blood transfusion     History of colon polyps     HTN (hypertension)     Hx of blood clots     DVT    Hyperlipemia     Menopause     Pulmonary nodule     Serum calcium elevated     Sleep apnea     not using cpap    Stroke [facility])        Past Surgical History:   Procedure Laterality Date    BACK SURGERY N/A 06/24/2022    INCISION AND DRAINAGE /DEBRIDEMENT SACRAL DECUBITUS performed by Phineas Semen, DO at [facility] MAIN OR    CESAREAN SECTION  1987    COLONOSCOPY N/A 01/13/2017    COLONOSCOPY performed by Levan Hurst, MD at [facility] ENDOSCOPY    COLONOSCOPY      DILATION AND CURETTAGE OF UTERUS  1995    RECTAL SURGERY N/A 12/28/2021    SACRAL WOUND DEBRIDEMENT INCISION AND DRAINAGE performed by Phineas Semen, DO at [facility] MAIN OR    SPINE SURGERY N/A 12/09/2021    CERVICAL THREE/FOUR/FIVE/SIX LAMINECTOMY FUSION; C-ARM; STRYKER; EXT BONE GROWTH STIM; 23 HR  performed by Pamalee Leyden, MD at [facility] MAIN OR       Family History   Problem Relation Age of Onset    Diabetes Other 75        parent, NOS    Cancer Father     Hypertension Other 35        parent,NOS    Heart Disease Other 59        parent, NOS    Lung Disease Father        Social History     Socioeconomic History    Marital status: Single     Spouse name: None    Number of children: None    Years of education: None    Highest education level: None   Tobacco Use    Smoking status: Former     Current packs/day: 0.00     Types: Cigarettes, Cigars     Quit date: 11/13/2021     Years since quitting: 0.7  Smokeless tobacco: Never   Vaping Use    Vaping Use: Never used   Substance and Sexual Activity    Alcohol use: Not Currently     Comment: occ    Drug use: Never     Social Determinants of Health     Financial Resource Strain: Low Risk  (01/05/2022)    Overall Financial Resource Strain (CARDIA)     Difficulty of Paying Living Expenses: Not hard at all   Food Insecurity: Food Insecurity Present (08/24/2022)    Hunger Vital Sign     Worried About Running Out of Food in the Last Year: Sometimes true     Ran Out of Food in the Last Year: Sometimes true   Transportation Needs: No Transportation Needs (08/24/2022)    PRAPARE - Armed forces logistics/support/administrative officer (Medical): No     Lack of Transportation (Non-Medical): No   Physical Activity: Inactive (01/05/2022)    Exercise Vital Sign     Days of Exercise per Week: 0 days     Minutes of Exercise per Session: 0 min   Stress: No Stress Concern Present (01/05/2022)    [place]     Feeling of Stress : Not at all   Social Connections: [place] (08/13/2022)    OASIS D0700: Social Isolation     Frequency of experiencing loneliness or isolation: Never   Intimate Partner Violence: Not At Risk (01/05/2022)    Humiliation, Afraid, Rape, and Kick questionnaire     Fear of Current or Ex-Partner: No      Emotionally Abused: No     Physically Abused: No     Sexually Abused: No   Housing Stability: Low Risk  (08/24/2022)    Housing Stability Vital Sign     Unable to Pay for Housing in the Last Year: No     Number of [place] in the Last Year: 1     Unstable Housing in the Last Year: No       Prior to Admission medications    Medication Sig Start Date End Date Taking? Authorizing Provider   potassium chloride (KLOR-CON M) 10 MEQ extended release tablet Take 1 tablet by mouth daily   Yes [provider]   cinacalcet (SENSIPAR) 30 MG tablet Take 1 tablet by mouth 2 times daily 08/13/22  Yes [provider]   hydrALAZINE (APRESOLINE) 10 MG tablet Take 1 tablet by mouth as needed (PRN for SBP > 160) 08/13/22  Yes [provider]   insulin glargine (LANTUS) 100 UNIT/ML injection vial Inject 30 Units into the skin daily 07/02/22   Elza Rafter, MD   famotidine (PEPCID) 20 MG tablet Take 1 tablet by mouth daily  Patient not taking: Reported on 08/24/2022 07/03/22   Elza Rafter, MD   insulin lispro (HUMALOG) 100 UNIT/ML SOLN injection vial Inject 7 Units into the skin 3 times daily (with meals) 07/02/22   Elza Rafter, MD   lactobacillus (CULTURELLE) capsule Take 1 capsule by mouth daily (with breakfast) 07/03/22   Elza Rafter, MD   carvedilol (COREG) 6.25 MG tablet Take 1 tablet by mouth 2 times daily (with meals) 07/02/22   Elza Rafter, MD   baclofen (LIORESAL) 10 MG tablet Take 1 tablet by mouth 3 times daily as needed (PAIN) Indications: Pain FOR PAIN PER SHETH 04/07/22   Jettie Booze D, PA   Magnesium Oxide 400 MG CAPS  Take 1 capsule by mouth daily.    [provider]   aspirin 325 MG tablet Take 1 tablet by mouth daily    [provider]   acetaminophen (TYLENOL) 500 MG tablet Take 1 tablet by mouth every 6 hours as needed for Fever or Pain 01/23/22   Jonah Blue, MD   insulin lispro (HUMALOG) 100 UNIT/ML SOLN injection vial Sliding  Scale insulin as per Institutional protocol 01/23/22   Jonah Blue, MD   atorvastatin (LIPITOR) 80 MG tablet Take 1 tablet by mouth nightly 01/06/22   Loleta Books, MD   ferrous sulfate (IRON 325) 325 (65 Fe) MG tablet Take 1 tablet by mouth daily 01/06/22   Loleta Books, MD   Multiple Vitamins-Minerals (THERAPEUTIC MULTIVITAMIN-MINERALS) tablet Take 1 tablet by mouth daily 01/07/22   Loleta Books, MD   furosemide (LASIX) 40 MG tablet furosemide 40 mg tablet   TAKE 1 TABLET BY MOUTH EVERY DAY 09/23/21   [provider]       Allergies   Allergen Reactions    Lactose Other (See Comments)     Intolerance              Physical Exam:      Visit Vitals  BP (!) 145/94   Pulse 80   Temp 98.5 F (36.9 C) (Axillary)   Resp 17   Ht 1.702 m (5\' 7" )   Wt 76.4 kg (168 lb 8 oz)   SpO2 100%   BMI 26.39 kg/m       Physical Exam  Neurological:      Mental Status: She is alert.           Lab/Data Review:  Labs: Results:       Chemistry Recent Labs     08/23/22  1305 08/24/22  0444   NA 144 143   K 3.6 2.7*   CL 108 110   CO2 33* 29   BUN 12 13   GLOB 3.9  --       CBC w/Diff Recent Labs     08/23/22  1305 08/24/22  0444   WBC 4.8 4.9   RBC 5.23 4.32   HGB 12.8 10.5*   HCT 41.1 33.7*   PLT 238 218      Coagulation Recent Labs     08/23/22  1305   INR 1.0       Iron/Ferritin No results for input(s): "IRON" in the last 72 hours.    Invalid input(s): "TIBCCALC"   BNP Invalid input(s): "BNPP"   Cardiac Enzymes No results for input(s): "CPK", "MYO" in the last 72 hours.    Invalid input(s): "CKRMB", "CKND1", "TROIP"   Liver Enzymes No results for input(s): "TP", "ALB" in the last 72 hours.    Invalid input(s): "TBIL", "AP", "SGOT", "GPT", "DBIL"   Thyroid Studies No results found for: "T4", "T3RU", "TSH"       Inpat Anti-Infectives (From admission, onward)       Start     Ordered Stop    08/24/22 2100  sulfamethoxazole-trimethoprim (BACTRIM;SEPTRA) 400-80 MG per tablet 2 tablet  2 tablet,    Oral,   2 times per day         08/24/22 1345 08/31/22 2059                  Results       Procedure Component Value Units Date/Time    Respiratory Panel, Molecular, with COVID-19 (Restricted: peds pts or suitable  admitted adults) [[id number]]  (Abnormal) Collected: 08/23/22 2322    Order Status: Completed Specimen: Nasopharyngeal Updated: 08/24/22 0203     Adenovirus by PCR Not detected        Coronavirus 229E by PCR Not detected        Coronavirus HKU1 by PCR Not detected        Coronavirus NL63 by PCR Not detected        Coronavirus OC43 by PCR Not detected        SARS-CoV-2, PCR Detected        Human Metapneumovirus by PCR Not detected        Rhinovirus Enterovirus PCR Not detected        Influenza A by PCR Not detected        Influenza B PCR Not detected        Parainfluenza 1 PCR Not detected        Parainfluenza 2 PCR Not detected        Parainfluenza 3 PCR Not detected        Parainfluenza 4 PCR Not detected        Respiratory Syncytial Virus by PCR Not detected        Bordetella parapertussis by PCR Not detected        Bordetella pertussis by PCR Not detected        Chlamydophila Pneumonia PCR Not detected        Mycoplasma pneumo by PCR Not detected       Urine Culture [[id number]] Collected: 08/23/22 1647    Order Status: Sent Specimen: Urine, clean catch Updated: 08/24/22 0855    Blood Culture 2 [[id number]] Collected: 08/23/22 1414    Order Status: Completed Specimen: Blood Updated: 08/24/22 0602     Special Requests NO SPECIAL REQUESTS        Culture NO GROWTH AFTER 15 HOURS       Blood Culture 1 [[id number]] Collected: 08/23/22 1407    Order Status: Completed Specimen: Blood Updated: 08/24/22 0602     Special Requests NO SPECIAL REQUESTS        Culture NO GROWTH AFTER 15 HOURS               Imaging Reviewed:  MRI brain without contrast  Narrative: EXAM: MRI BRAIN WO CONTRAST    CLINICAL INDICATION/HISTORY: Altered mental status, prior brainstem infarction    TECHNIQUE: Multisequence multiplanar MR  imaging acquired through the brain.     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  infarction these are stable    Cerebellum demonstrates old watershed infarction on the left    Diffusion images are negative for any acute area of restricted diffusion    Gradient images unremarkable    Deep white matter demonstrates scattered plaque and punctate areas of gliotic  change on the right and left, left more burden noted    Overall mild to moderate burden chronic small vessel disease    Hemispheres reflect a moderate generalized atrophy    There is also more preferential volume loss in the hippocampal and medial  temporal region    Frontal and parietal lobes are relatively spared     No acute infarction. No acute hemorrhage. No mass lesion.     CSF spaces: There is a large calcification along the dural to the left  of  midline involving frontal parietal region consistent with a large benign dural  plaque    Measuring 40 x 7.7 mm    Underlying en plaque meningioma cannot be excluded, not felt to be clinically  significant finding    IAC regions: Unremarkable    Parasellar region: Unremarkable    Vasculature: Appropriate flow voids within the major skull base vasculature.    Cervicomedullary junction: Junction intact however there is evidence for  multiple prior posterior cervical pedicle screw fixations from C3 inferiorly    There is also a large herniation at C3-4 and C4-5 with at least mild compression  of the cord, these changes are stable from the May 2023 exam    Orbits: Unremarkable    Paranasal sinuses: Clear   Impression: Evidence for significant old brainstem infarction right and left side    Moderate burden chronic small vessel disease    Moderate generalized atrophy with more focal hippocampal and middle temporal  lobe volume loss    Question possibility of clinical mild  cognitive impairment versus early  Alzheimer's presentation    No acute infarction, bleed or mass is seen  CTA HEAD NECK W WO CONTRAST  Narrative: EXAM: CTA HEAD NECK W WO CONTRAST    PROVIDED REASON FOR EXAM: "Infarct eval"    STROKE ALERT PRELIMINARY REPORT PRIOR TO POSTPROCESSING:    Patent anterior intracranial brain arteries. No LVO.  Patent cervical carotid arteries.  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  Patent intradural RVA, basilar, and PCAs.  Moderate stenosis in the distal left ICA.    Called to ED PA Galyo at 555pm 08-23-22     EXAM: CTA HEAD NECK W WO CONTRAST    CLINICAL INDICATIONS: infarct eval altered mental status    TECHNIQUE: Helical CT scan of the brain and neck were performed at 1.25 mm  intervals during rapid IV bolus contrast administration.  These data were  reconstructed at .625 mm intervals for vascular analysis.  The data was also  reviewed at 2.5 mm intervals for accompanying soft tissue analysis. 3D post  processed images, including surface shaded displays, were produced for this exam  on independent console, permanently archived and interpreted.    All CT scans at this facility are performed using dose optimization technique as  appropriate to a performed exam, to include automated exposure control,  adjustment of the MA and/or kV according to patient size (including appropriate  matching for site-specific examinations) or use of  iterative reconstruction  technique.    CONTRAST: 80 cc Isovue 370    COMPARISON: Head CT January 20, MRI August 23, 2022    CTA SOFT TISSUE ANALYSIS:  Lungs: Clear  Upper chest: Air refluxed in the upper esophagus question GERD  Neck: There is mild medialization of the internal carotid on the right side  Lymph nodes: No adenopathy  Orbits: Unremarkable  Paranasal sinuses: Clear  Brain: Please see same-day head CT report  Bones: There is missing maxillary and mandibular dentition appears evidence for  periodontal disease in  the maxilla on the right side    There are multiple dental caries    CTA NECK VASCULAR ANALYSIS:     Aortic arch: Left sided with typical configuration.    Innominate: Patent    Right Subclavian: Patent  Left Subclavian: Patent    Right carotid:  -CCA: Heavy atherosclerotic changes around the carotid siphon is observed  however no high-grade stenosis noted  -ECA: Patent  -ICA:  Patent. Estimated category 1, less than 10%% stenosis of proximal ICA by  NASCET criteria.    Left carotid:  -CCA: Any atherosclerotic changes in the carotid siphon suspect at least  moderate stenosis of the proximal portion of the carotid siphon    The middle and anterior portions also demonstrate atherosclerotic change however  no significant stenosis  -ECA: Patent  -ICA: Patent Estimated category 1, less than 10%% stenosis of proximal ICA by  NASCET criteria.    Right vertebral: Patent     Left vertebral: Origin is occluded, reconstitution of the distal section 2 and 3  identified within diminutive appearance to the vertebral artery    The intradural portion on the left demonstrates a small amount of more distal  section 4 that has been reconstituted to the basilar    CTA BRAIN VASCULAR ANALYSIS:     Right anterior circulation:  -ICA: Patent  -ACA: Patent   -MCA: Patent    Left anterior circulation:  -ICA: Patent  -ACA: Patent   -MCA: Patent    -ACOM: Unremarkable  -PCOM: Bilateral fetal circulation    Posterior circulation:  -RVA: Patent  -LVA: There is occlusion of the proximal portion extending in the section to    High-grade stenosis versus chronic occlusion at the transition point between  intra and extradural section 4 however reconstitution of a portion of the  intradural section 4 is observed  -Basilar: Patent  -Right PCA: Patent  -Left PCA: Patent    Major dural venous sinuses: Unremarkable  Impression: 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 of the distal  sections 2, 3    Section 4 demonstrates again severe  atherosclerotic changes with what appears to  be occlusion at the transition between intra and extradural portions with distal  reconstitution of the intradural portion    The extracranial right vertebral, carotids and subclavian is are patent    Intracranial vessels unremarkable for any LVO    Bilateral fetal circulation is noted which may be acting compensatory fashion to  preserve the posterior circulation    Question GERD    Poor dental health, numerous caries and evidence for at least one focus of  periodontal disease in the right maxillary region          Assessment/Plan:   Acute metabolic encephalopathy, resolved, due to hypoglycemic episode, Hx DMII, A1C 6.9  D/c on SSI humalog, no lantus or insulin needed this admission   Sepsis r/o this admission, SIRs criteria, due to above  Low- no concern sepsis at this point, SIRS secondary to hypoglycemic event   UTI, with low pro calcitonin  IV abx streamlined to bactrim   Lyte derangements due to malnutrition and lasix use  Aggressive replacement, recheck this afternoon   Sacral ulcer with wound vac  Subacute CVA on CT head, no stroke on MRI   Cont' ASA  Peridontal disease/dental caries   Recommend dentist appt at d/c   Recent COVID infection 12/24, remains (+) on PCR , no s/o   OSA with CPAP, noncompliance   Hx CVA with R sided weakness     Possible d/c today or tomorrow pending labs       Best Practice  AC-Lovenox  SUP- not indicated  DIET- ADULT DIET; Regular; 3 carb choices (45 gm/meal); Low Fat/Low Chol/High Fiber/NAS  Home medications- reviewed  Full Code      Total of 35 min time spent at bedside during the course of care providing evaluation,management and care decisions  and ordering appropriate treatment.  The reason for providing this level of medical care for this ill patient was due an illness that impaired one or more vital organ systems such that there was a high probability of imminent or life threatening deterioration in the patients condition. This  care involved high complexity decision making to assess, manipulate, and support vital system functions, to treat this degree vital organ system failure and to prevent further life threatening deterioration of the patient's condition    Darlin Drop, PA-C  08/24/22

## 2022-08-24 NOTE — Consults (Signed)
[facility]  Department of Neurology  Chief Complaint   Patient presents with    Hypoglycemia       HPI:   [name] Short is a 67 y.o. 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    Patient went to bed 1 am of admission morning as usual. She got up at 5 am briefly. She formally got up at 8 am. She had diabetic medication and breakfast. At 10 am, her caregiver came. She then lost consciousness and memory. When she woke up, she found she was surrounded by paramedics who sent her to hospital. She was found hypoglycemia 32.    Patient stated that she has normal appetite and oral intake    Of note, per daughter, patient last took her 30 U Lantus at 00:30 AM this morning before going to bed, did not have a bedtime snack. This morning patient had breakfast at 7:30 AM and had her SSI. Initially in the morning her BS was 119, but subsequently she became diaphoretic, altered, unresponsive.     Patient also has RLE weakness at baseline and uses a walker. Patient had a sacral wound vac in place since ay 2023.      In the ED, temp 94.6-->97.8, Resp 10-13, HR 50-70, BP 179/81, 100% on RA. Mag 1.2, Lactic acid 2.4--> 0.8. Procal < 0.05. Trop x2 negative. Glucose 49-->127. Chronically elevated liver enzymes. CBC without abnormality. Blood cultures x 2 obtained. Urine culture obtained. CXR negative. EKG with sinus bradycardia. UA with positive nitrite, no WBC. CT head with subacute infarct. CTA head and neck preliminary read with no LVO. She was not considered as a TPA candidate by teleneurology    She was tested COVID-19 positive    Past Medical History:   Diagnosis Date    Anemia     Arthritis     Chronic pain     Legs and Shoulders    Diabetes (HCC)     Exposure to asbestos     History of blood transfusion     History of colon polyps     HTN (hypertension)     Hx of blood clots     DVT     Hyperlipemia     Menopause     Pulmonary nodule     Serum calcium elevated     Sleep apnea     not using cpap    Stroke [facility])        Past Surgical History:   Procedure Laterality Date    BACK SURGERY N/A 06/24/2022    INCISION AND DRAINAGE /DEBRIDEMENT SACRAL DECUBITUS performed by Phineas Semen, DO at [facility] MAIN OR    CESAREAN SECTION  1987    COLONOSCOPY N/A 01/13/2017    COLONOSCOPY performed by Levan Hurst, MD at [facility] ENDOSCOPY    COLONOSCOPY      DILATION AND CURETTAGE OF UTERUS  1995    RECTAL SURGERY N/A 12/28/2021    SACRAL WOUND DEBRIDEMENT INCISION AND DRAINAGE performed by Phineas Semen, DO at [facility] MAIN OR    SPINE SURGERY N/A 12/09/2021    CERVICAL THREE/FOUR/FIVE/SIX LAMINECTOMY FUSION; C-ARM; STRYKER; EXT BONE GROWTH STIM; 23 HR performed by Pamalee Leyden, MD at [facility] MAIN OR       Current Facility-Administered Medications   Medication Dose Route Frequency Provider Last Rate Last Admin    baclofen (LIORESAL) tablet 5 mg  5 mg  Oral TID PRN Olin PiaSchley, Emily, MD   5 mg at 08/24/22 0230    magnesium oxide (MAG-OX) tablet 400 mg  400 mg Oral Daily Olin PiaSchley, Emily, MD   400 mg at 08/24/22 0857    potassium bicarb-citric acid (EFFER-K) effervescent tablet 40 mEq  40 mEq Oral Q6H Olin PiaSchley, Emily, MD   40 mEq at 08/24/22 0857    aspirin tablet 325 mg  325 mg Oral Daily Galyo, Jana L, PA   325 mg at 08/24/22 [id number]    clopidogrel (PLAVIX) tablet 75 mg  75 mg Oral Daily Galyo, Jana L, PA   75 mg at 08/24/22 0857    0.9 % sodium chloride infusion   IntraVENous Continuous Randalyn RheaPatel, Kushangi, APRN - NP 100 mL/hr at 08/23/22 2152 Restarted at 08/23/22 2152    ondansetron (ZOFRAN-ODT) disintegrating tablet 4 mg  4 mg Oral Q8H PRN Randalyn RheaPatel, Kushangi, APRN - NP        Or    ondansetron [facility](ZOFRAN) injection 4 mg  4 mg IntraVENous Q6H PRN Randalyn RheaPatel, Kushangi, APRN - NP        senna (SENOKOT) tablet 8.6 mg  1 tablet Oral Daily PRN Randalyn RheaPatel, Kushangi, APRN - NP        atorvastatin (LIPITOR) tablet 80 mg  80 mg Oral Nightly Randalyn RheaPatel, Kushangi, APRN  - NP   80 mg at 08/23/22 1952    labetalol (NORMODYNE;TRANDATE) injection 5 mg  5 mg IntraVENous Q10 Min PRN Randalyn RheaPatel, Kushangi, APRN - NP        famotidine (PEPCID) tablet 20 mg  20 mg Oral Daily Randalyn RheaPatel, Kushangi, APRN - NP   20 mg at 08/24/22 0857    piperacillin-tazobactam (ZOSYN) 3,375 mg in sodium chloride 0.9 % 50 mL IVPB (mini-bag)  3,375 mg IntraVENous Q8H Randalyn RheaPatel, Kushangi, APRN - NP   Stopped at 08/24/22 0834    vancomycin (VANCOCIN) 1,000 mg in sodium chloride 0.9 % 250 mL (vial-mate) IVPB  1,000 mg IntraVENous Q12H Randalyn RheaPatel, Kushangi, APRN - NP 250 mL/hr at 08/24/22 0858 1,000 mg at 08/24/22 0858    cinacalcet (SENSIPAR) tablet 30 mg  30 mg Oral BID Randalyn RheaPatel, Kushangi, APRN - NP   30 mg at 08/24/22 0858    ferrous sulfate (IRON 325) tablet 325 mg  325 mg Oral Daily Randalyn RheaPatel, Kushangi, APRN - NP   325 mg at 08/24/22 [id number]        Allergies   Allergen Reactions    Lactose Other (See Comments)     Intolerance       Social History     Tobacco Use    Smoking status: Former     Current packs/day: 0.00     Types: Cigarettes, Cigars     Quit date: 11/13/2021     Years since quitting: 0.7    Smokeless tobacco: Never   Vaping Use    Vaping Use: Never used   Substance Use Topics    Alcohol use: Not Currently     Comment: occ    Drug use: Never       Family History   Problem Relation Age of Onset    Diabetes Other 45        parent, NOS    Cancer Father     Hypertension Other 35        parent,NOS    Heart Disease Other 7962        parent, NOS    Lung Disease Father        Review of  Systems:  GENERAL: Denies fever or fatigue  CARDIAC: No CP or SOB  PULMONARY: No cough or SOB  MUSCULOSKELETAL: No new joint pain  NEURO: SEE HPI    Physical Examination:  BP (!) 150/78   Pulse 75   Temp 98.3 F (36.8 C) (Oral)   Resp 16   Ht 1.702 m (5\' 7" )   Wt 76.4 kg (168 lb 8 oz)   SpO2 99%   BMI 26.39 kg/m     GENERAL APPEARANCE:  The patient's not in acute distress.  EYES:  Anicteric.   NECK:  Supple.  CARDIOVASCULAR:  Pulses are present  bilaterally.  LUNGS:  No respiratory distress.    EXTREMITIES: No edema.    NEUROLOGICAL EXAM:  Mental status:  The patient has a normal level of alertness and awareness.  The speed of cognition is slow.  Orientation is full to person, place, situation and time.  Concentration and attention are slow.  Memory are impaired. Patient can name and repeat sentences. Expressive language is fluent and content is normal.  Comprehension is normal.      Cranial nerves:     2:  Visual fields are full.  Visual Acuity is good for reading and TV viewing. Pupils are equal and pupillary responses are normal.   3, 4, 6:  Extraocular muscles are intact. There is no ptosis, congenital esotropia/exotropias, or nystagmus.   5:  Sensation and jaw opening are normal.   7:  Facial strength is intact.     8:  Hearing is intact.   9:  Palate elevates symmetrically.  10:  No hoarseness of voice.  11:  Sternocleidomastoid and trapezius strength intact.  12:  Tongue protrudes rightward.  There are no fasciculations of the tongue.     Motor:  The strength is 5/5 in left limb all muscle groups, 4+/5 right limbs.  The bulk and tone is normal. There are no abnormal movements.     Sensory:  Sensation to light touch are symmetric and intact.     Cerebellar:  There is no dysmetria.    Reflexes:  1+ symmetrically. No Hoffman sign. No ankle clonus. Plantar responses are flexor.    Recent Results (from the past 24 hour(s))   POCT Glucose    Collection Time: 08/23/22  1:01 PM   Result Value Ref Range    POC Glucose 63 (L) 70 - 110 mg/dL   CBC with Auto Differential    Collection Time: 08/23/22  1:05 PM   Result Value Ref Range    WBC 4.8 4.6 - 13.2 K/uL    RBC 5.23 4.20 - 5.30 M/uL    Hemoglobin 12.8 12.0 - 16.0 g/dL    Hematocrit 16.141.1 09.635.0 - 45.0 %    MCV 78.6 78.0 - 100.0 FL    MCH 24.5 24.0 - 34.0 PG    MCHC 31.1 31.0 - 37.0 g/dL    RDW 04.516.0 (H) 40.911.6 - 14.5 %    Platelets 238 135 - 420 K/uL    MPV 10.2 9.2 - 11.8 FL    Nucleated RBCs 0.0 0 PER 100 WBC     nRBC 0.00 0.00 - 0.01 K/uL    Neutrophils % 74 (H) 40 - 73 %    Lymphocytes % 14 (L) 21 - 52 %    Monocytes % 9 3 - 10 %    Eosinophils % 3 0 - 5 %    Basophils % 0 0 - 2 %  Immature Granulocytes 0 0.0 - 0.5 %    Neutrophils Absolute 3.5 1.8 - 8.0 K/UL    Lymphocytes Absolute 0.7 (L) 0.9 - 3.6 K/UL    Monocytes Absolute 0.4 0.05 - 1.2 K/UL    Eosinophils Absolute 0.2 0.0 - 0.4 K/UL    Basophils Absolute 0.0 0.0 - 0.1 K/UL    Absolute Immature Granulocyte 0.0 0.00 - 0.04 K/UL    Differential Type AUTOMATED     CMP    Collection Time: 08/23/22  1:05 PM   Result Value Ref Range    Sodium 144 136 - 145 mmol/L    Potassium 3.6 3.5 - 5.5 mmol/L    Chloride 108 100 - 111 mmol/L    CO2 33 (H) 21 - 32 mmol/L    Anion Gap 3 3.0 - 18 mmol/L    Glucose 49 (LL) 74 - 99 mg/dL    BUN 12 7.0 - 18 MG/DL    Creatinine 0.72 0.6 - 1.3 MG/DL    Bun/Cre Ratio 17 12 - 20      Est, Glom Filt Rate >60 >60 ml/min/1.92m2    Calcium 9.0 8.5 - 10.1 MG/DL    Total Bilirubin 0.5 0.2 - 1.0 MG/DL    ALT 58 (H) 13 - 56 U/L    AST 55 (H) 10 - 38 U/L    Alk Phosphatase 135 (H) 45 - 117 U/L    Total Protein 7.0 6.4 - 8.2 g/dL    Albumin 3.1 (L) 3.4 - 5.0 g/dL    Globulin 3.9 2.0 - 4.0 g/dL    Albumin/Globulin Ratio 0.8 0.8 - 1.7     Troponin    Collection Time: 08/23/22  1:05 PM   Result Value Ref Range    Troponin, High Sensitivity 9 0 - 54 ng/L   Magnesium    Collection Time: 08/23/22  1:05 PM   Result Value Ref Range    Magnesium 1.2 (L) 1.6 - 2.6 mg/dL   Procalcitonin    Collection Time: 08/23/22  1:05 PM   Result Value Ref Range    Procalcitonin <0.05 ng/mL   Protime-INR    Collection Time: 08/23/22  1:05 PM   Result Value Ref Range    Protime 13.7 11.9 - 14.7 sec    INR 1.0 0.9 - 1.1     Hemoglobin A1C    Collection Time: 08/23/22  1:05 PM   Result Value Ref Range    Hemoglobin A1C 6.9 (H) 4.2 - 5.6 %    eAG 151 mg/dL   EKG 12 Lead    Collection Time: 08/23/22  1:20 PM   Result Value Ref Range    Ventricular Rate 53 BPM    Atrial Rate 53 BPM     P-R Interval 130 ms    QRS Duration 78 ms    Q-T Interval 498 ms    QTc Calculation (Bazett) 467 ms    P Axis 69 degrees    R Axis 13 degrees    T Axis -3 degrees    Diagnosis       Sinus bradycardia  Septal infarct (cited on or before 17-Aug-2022)  Abnormal ECG  When compared with ECG of 17-Aug-2022 19:46,  No significant change was found  Confirmed by Carnegie Pink, M.D., [place] [id number]) on 08/23/2022 9:32:29 PM     POCT Glucose    Collection Time: 08/23/22  1:48 PM   Result Value Ref Range    POC Glucose 72 70 - 110 mg/dL  Blood Culture 1    Collection Time: 08/23/22  2:07 PM    Specimen: Blood   Result Value Ref Range    Special Requests NO SPECIAL REQUESTS      Culture NO GROWTH AFTER 15 HOURS     Lactate, Sepsis    Collection Time: 08/23/22  2:07 PM   Result Value Ref Range    Lactic Acid, Sepsis 2.4 (HH) 0.4 - 2.0 MMOL/L   Blood Culture 2    Collection Time: 08/23/22  2:14 PM    Specimen: Blood   Result Value Ref Range    Special Requests NO SPECIAL REQUESTS      Culture NO GROWTH AFTER 15 HOURS     POCT Glucose    Collection Time: 08/23/22  2:17 PM   Result Value Ref Range    POC Glucose 148 (H) 70 - 110 mg/dL   POCT Glucose    Collection Time: 08/23/22  3:32 PM   Result Value Ref Range    POC Glucose 163 (H) 70 - 110 mg/dL   Lactate, Sepsis    Collection Time: 08/23/22  4:22 PM   Result Value Ref Range    Lactic Acid, Sepsis 0.8 0.4 - 2.0 MMOL/L   Urinalysis    Collection Time: 08/23/22  4:47 PM   Result Value Ref Range    Color, UA YELLOW      Appearance CLOUDY      Specific Gravity, UA 1.009 1.005 - 1.030      pH, Urine 7.0 5.0 - 8.0      Protein, UA 30 (A) NEG mg/dL    Glucose, UA Negative NEG mg/dL    Ketones, Urine Negative NEG mg/dL    Bilirubin Urine Negative NEG      Blood, Urine Negative NEG      Urobilinogen, Urine 0.2 0.2 - 1.0 EU/dL    Nitrite, Urine Positive (A) NEG      Leukocyte Esterase, Urine Negative NEG     Urinalysis, Micro    Collection Time: 08/23/22  4:47 PM   Result Value Ref Range    WBC,  UA 0 to 1 0 - 4 /hpf    RBC, UA 0 to 1 0 - 5 /hpf    Epithelial Cells UA 1+ 0 - 5 /lpf    BACTERIA, URINE 2+ (A) NEG /hpf   Troponin    Collection Time: 08/23/22  5:04 PM   Result Value Ref Range    Troponin, High Sensitivity 9 0 - 54 ng/L   POCT Glucose    Collection Time: 08/23/22  5:54 PM   Result Value Ref Range    POC Glucose 127 (H) 70 - 110 mg/dL   POCT Glucose    Collection Time: 08/23/22  7:56 PM   Result Value Ref Range    POC Glucose 138 (H) 70 - 110 mg/dL   Respiratory Panel, Molecular, with COVID-19 (Restricted: peds pts or suitable admitted adults)    Collection Time: 08/23/22 11:22 PM    Specimen: Nasopharyngeal   Result Value Ref Range    Adenovirus by PCR Not detected NOTD      Coronavirus 229E by PCR Not detected NOTD      Coronavirus HKU1 by PCR Not detected NOTD      Coronavirus NL63 by PCR Not detected NOTD      Coronavirus OC43 by PCR Not detected NOTD      SARS-CoV-2, PCR Detected (A) NOTD      Human Metapneumovirus  by PCR Not detected NOTD      Rhinovirus Enterovirus PCR Not detected NOTD      Influenza A by PCR Not detected NOTD      Influenza B PCR Not detected NOTD      Parainfluenza 1 PCR Not detected NOTD      Parainfluenza 2 PCR Not detected NOTD      Parainfluenza 3 PCR Not detected NOTD      Parainfluenza 4 PCR Not detected NOTD      Respiratory Syncytial Virus by PCR Not detected NOTD      Bordetella parapertussis by PCR Not detected NOTD      Bordetella pertussis by PCR Not detected NOTD      Chlamydophila Pneumonia PCR Not detected NOTD      Mycoplasma pneumo by PCR Not detected NOTD     POCT Glucose    Collection Time: 08/23/22 11:27 PM   Result Value Ref Range    POC Glucose 148 (H) 70 - 110 mg/dL   Magnesium    Collection Time: 08/24/22  4:44 AM   Result Value Ref Range    Magnesium 1.6 1.6 - 2.6 mg/dL   TSH    Collection Time: 08/24/22  4:44 AM   Result Value Ref Range    TSH, 3RD GENERATION 0.57 0.36 - 3.74 uIU/mL   Basic Metabolic Panel w/ Reflex to MG    Collection Time:  08/24/22  4:44 AM   Result Value Ref Range    Sodium 143 136 - 145 mmol/L    Potassium 2.7 (LL) 3.5 - 5.5 mmol/L    Chloride 110 100 - 111 mmol/L    CO2 29 21 - 32 mmol/L    Anion Gap 4 3.0 - 18 mmol/L    Glucose 160 (H) 74 - 99 mg/dL    BUN 13 7.0 - 18 MG/DL    Creatinine 1.61 0.6 - 1.3 MG/DL    Bun/Cre Ratio 17 12 - 20      Est, Glom Filt Rate >60 >60 ml/min/1.51m2    Calcium 8.4 (L) 8.5 - 10.1 MG/DL   CBC    Collection Time: 08/24/22  4:44 AM   Result Value Ref Range    WBC 4.9 4.6 - 13.2 K/uL    RBC 4.32 4.20 - 5.30 M/uL    Hemoglobin 10.5 (L) 12.0 - 16.0 g/dL    Hematocrit 09.6 (L) 35.0 - 45.0 %    MCV 78.0 78.0 - 100.0 FL    MCH 24.3 24.0 - 34.0 PG    MCHC 31.2 31.0 - 37.0 g/dL    RDW 04.5 (H) 40.9 - 14.5 %    Platelets 218 135 - 420 K/uL    MPV 10.2 9.2 - 11.8 FL    Nucleated RBCs 0.0 0 PER 100 WBC    nRBC 0.00 0.00 - 0.01 K/uL   Hemoglobin A1c    Collection Time: 08/24/22  4:44 AM   Result Value Ref Range    Hemoglobin A1C 6.9 (H) 4.2 - 5.6 %    eAG 151 mg/dL   Lipid Panel    Collection Time: 08/24/22  4:44 AM   Result Value Ref Range    LIPID PANEL        Cholesterol, Total 119 <200 MG/DL    Triglycerides 53 <811 MG/DL    HDL 42 40 - 60 MG/DL    LDL Calculated 91.4 0 - 100 MG/DL    VLDL Cholesterol Calculated 10.6 MG/DL  Chol/HDL Ratio 2.8 0 - 5.0     Phosphorus    Collection Time: 08/24/22  4:44 AM   Result Value Ref Range    Phosphorus 2.1 (L) 2.5 - 4.9 MG/DL   POCT Glucose    Collection Time: 08/24/22  6:05 AM   Result Value Ref Range    POC Glucose 161 (H) 70 - 110 mg/dL       MRI brain without contrast  Impression: Evidence for significant old brainstem infarction right and left side    Moderate burden chronic small vessel disease    Moderate generalized atrophy with more focal hippocampal and middle temporal  lobe volume loss    Question possibility of clinical mild cognitive impairment versus early  Alzheimer's presentation    No acute infarction, bleed or mass is seen  CTA HEAD NECK W WO  CONTRAST  Impression: 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 of the distal  sections 2, 3    Section 4 demonstrates again severe atherosclerotic changes with what appears to  be occlusion at the transition between intra and extradural portions with distal  reconstitution of the intradural portion    The extracranial right vertebral, carotids and subclavian is are patent    Intracranial vessels unremarkable for any LVO    Bilateral fetal circulation is noted which may be acting compensatory fashion to  preserve the posterior circulation    Question GERD    Poor dental health, numerous caries and evidence for at least one focus of  periodontal disease in the right maxillary region     12/26/21    TRANSTHORACIC ECHOCARDIOGRAM (TTE) COMPLETE (CONTRAST/BUBBLE/3D PRN) 12/28/2021  2:47 PM (Final)    Interpretation Summary    Left Ventricle: Normal left ventricular systolic function with a visually estimated EF of 55 - 60%. Left ventricle size is normal. Mildly increased wall thickness. Normal wall motion. Normal diastolic function.    Right Ventricle: Normal systolic function. TAPSE is normal. TAPSE is 2.1 cm.    Left Atrium: Left atrium size is normal. Left atrial volume index is normal (16-34 mL/m2). LA Vol Index A/L is 18 mL/m2.    Mitral Valve: Mildly thickened leaflet.    Tricuspid Valve: Unable to assess RVSP due to inadequate or insignificant tricuspid regurgitation.    Signed by: Eulis Canner, MD on 12/28/2021  2:47 PM   Impression  [name] Short is a 67 y.o. female whose history and physical are consistent with unresponsiveness secondary to hypoglycemia.      Plan:  Insulin dose should be adjusted, as patient had hypoglycemia under normal oral intake  Since MRI brain is negative for new stroke, continue aspirin, and cease plavix  PT/OT/ST  COVID-19 management per medical team  Keep BP 120/80, LDL 70, A1C 5.6  Antibiotics for sepsis such as cystitis and sacral wound infection, given lactic  acidosis, hypothermia       Total time 58 minutes taking care of patient in following activities:  1-Preparing to see the patient (review of tests, prior medical visits)  2-Obtaining and/or reviewing separately medically appropriate obtained history  3-Performing the medically appropriate exam and/or evaluation  4-Clinical documentation in the EHR or other health record  5-Counseling and educating the patient or caregiver  6-Interpreting results and/or communicating results to the patient/family/caregiver  7-Care coordination  8-Ordering medications, tests, or procedures  9-Referring and communicating with other health care professionals  10-Documentation in EHR.     Signed By: Youlanda Mighty, MD. PhD, [place]

## 2022-08-24 NOTE — Plan of Care (Signed)
Problem: Occupational Therapy - Adult  Goal: By Discharge: Performs self-care activities at highest level of function for planned discharge setting.  See evaluation for individualized goals.  Description: Occupational Therapy Goals:  Initiated 08/24/2022 to be met within 7-10 days.    1.  Patient will perform bathing with modified independence.   2.  Patient will perform lower body dressing with modified independence.  3.  Patient will perform grooming  with modified independence standing at sink >5 minutes.  4.  Patient will perform toilet transfers with modified independence.  5.  Patient will perform all aspects of toileting with modified independence.  6.  Patient will participate in upper extremity therapeutic exercise/activities with modified independence for 8-10 minutes to increase strength/endurance for ADLs.    7.  Patient will utilize energy conservation techniques during functional activities with verbal and visual cues.    PLOF: Pt lives in a 1 story home with daughter. Independent in all self care tasks.     Outcome: Progressing    OCCUPATIONAL THERAPY EVALUATION    Patient: [name] Short (67 y.o. female)  Date: 08/24/2022  Primary Diagnosis: Hypomagnesemia [E83.42]  Hypoglycemia [E16.2]  Abnormal CT of the head [R93.0]  Acute UTI [N39.0]  Hypothermia, initial encounter [T68.XXXA]  Altered mental status, unspecified altered mental status type [R41.82]  Cerebrovascular accident (CVA), unspecified mechanism ([place]) [I63.9]  Pressure injury of skin of sacral region, unspecified injury stage [L89.159]  AMS (altered mental status) [R41.82]       Precautions: Fall Risk, Contact Precautions     ASSESSMENT :  Pt cleared for OT evaluation and pt agreeable. Seen with PT to increase safety and participation. Pt presents with decreased strength, endurance, and independence in ADLs. Pt required modA for bed mobility to sit EOB. Pt reports needing to use the bathroom. Put BSC next to bed and required minA for STS and  CGA for bed to [facility] txr. Stood with minA to stand and maxA for peri-hygiene. Returned to bed with CGA. Required modA to return to supine and provided pillows to assist with positioning. Left pt lying comfortably in bed with all needs met and call bell within reach.      DEFICITS/IMPAIRMENTS:  Performance deficits / Impairments: Decreased functional mobility ;Decreased ADL status;Decreased strength;Decreased balance;Decreased endurance    Patient will benefit from skilled intervention to address the above impairments.  Patient's rehabilitation potential/Prognosis: Good.  Factors which may influence rehabilitation potential include:   [x]              None noted  []              Mental ability/status  []              Medical condition  []              Home/family situation and support systems  []              Safety awareness  []              Pain tolerance/management  []              Other:      PLAN :  Recommendations and Planned Interventions:   [x]                Self Care Training                  [x]       Therapeutic Activities  [x]   Functional Mobility Training   []       Cognitive Retraining  [x]                Therapeutic Exercises           [x]       Endurance Activities  [x]                Balance Training                    []       Neuromuscular Re-Education  []                Visual/Perceptual Training     [x]       Home Safety Training  [x]                Patient Education                   [x]       Family Training/Education  []                Other (comment):    Frequency/Duration: Patient will be followed by occupational therapy to address goals, 1-2 times per day/3-5 days per week to address goals.    Further Equipment Recommendations for Discharge: bedside commode    AMPAC: AM-PAC Inpatient Daily Activity Raw Score: 17    Current research shows that an AM-PAC score of 17 or less is not associated with a discharge to the patient's home setting.  Based on an AM-PAC score and their current ADL deficits; it  is recommended that the patient have 3-5 sessions per week of Occupational Therapy at d/c to increase the patient's independence.      This AMPAC score should be considered in conjunction with interdisciplinary team recommendations to determine the most appropriate discharge setting. Patient's social support, diagnosis, medical stability, and prior level of function should also be taken into consideration.     SUBJECTIVE:   Patient stated, "I need to go to the bathroom."    OBJECTIVE DATA SUMMARY:     Past Medical History:   Diagnosis Date    Anemia     Arthritis     Chronic pain     Legs and Shoulders    Diabetes (HCC)     Exposure to asbestos     History of blood transfusion     History of colon polyps     HTN (hypertension)     Hx of blood clots     DVT    Hyperlipemia     Menopause     Pulmonary nodule     Serum calcium elevated     Sleep apnea     not using cpap    Stroke [facility])      Past Surgical History:   Procedure Laterality Date    BACK SURGERY N/A 06/24/2022    INCISION AND DRAINAGE /DEBRIDEMENT SACRAL DECUBITUS performed by , DO at [facility] MAIN OR    CESAREAN SECTION  1987    COLONOSCOPY N/A 01/13/2017    COLONOSCOPY performed by , MD at [facility]    COLONOSCOPY      DILATION AND CURETTAGE OF UTERUS  1995    RECTAL SURGERY N/A 12/28/2021    SACRAL WOUND DEBRIDEMENT INCISION AND DRAINAGE performed by , DO at [facility] MAIN OR    SPINE SURGERY N/A 12/09/2021    CERVICAL THREE/FOUR/FIVE/SIX LAMINECTOMY FUSION; C-ARM; STRYKER; EXT BONE GROWTH STIM; 23 HR performed  by Pamalee Leyden, MD at [facility] MAIN OR     Home Situation:   Social/Functional History  Lives With: Family  Type of Home: House  Home Layout: One level  Home Access: Level entry  Bathroom Shower/Tub: Tub/Shower unit  Bathroom Toilet: Standard  Receives Help From: Family  ADL Assistance: Independent  Homemaking Assistance: Independent  Ambulation Assistance: Needs assistance  Transfer Assistance: Independent  []    Right hand dominant   []   Left hand dominant    Cognitive/Behavioral Status:  Orientation  Overall Orientation Status: Within Normal Limits  Orientation Level: Oriented X4  Cognition  Overall Cognitive Status: WNL    Skin: sacral wound w/ wound vac   Edema: none noted     Vision/Perceptual:    Vision  Vision: Within Functional Limits        Coordination: BUE  Coordination: Generally decreased, functional    Balance:   Balance  Sitting: With support  Standing: Impaired  Standing - Static: Fair  Standing - Dynamic: Fair    Strength: BUE  Strength: Generally decreased, functional    Tone & Sensation: BUE  Tone: Normal  Sensation: Intact    Range of Motion: BUE  AROM: Generally decreased, functional    Functional Mobility and Transfers for ADLs:  Bed Mobility:  Bed Mobility Training  Bed Mobility Training: Yes  Rolling: Moderate assistance  Supine to Sit: Minimum assistance;Additional time  Sit to Supine: Minimum assistance  Scooting: Minimum assistance  Transfers:  Transfer Training  Transfer Training: Yes  Sit to Stand: Minimum assistance  Stand to Sit: Minimum assistance  Stand Pivot Transfers: Contact-guard assistance;Adaptive equipment  Toilet Transfer: Stand-by assistance    ADL Assessment:   Feeding: Modified independent   Grooming: Stand by assistance  UE Bathing: Minimal assistance  LE Bathing: Minimal assistance  UE Dressing: Minimal assistance  LE Dressing: Moderate assistance  Functional Mobility: Minimal assistance    Pain:  Pain level pre-treatment: 0/10   Pain level post-treatment: 0/10   Pain Intervention(s): Rest, Ice, Repositioning   Response to intervention: Nurse notified    Activity Tolerance:   Activity Tolerance: Patient Tolerated treatment well  Please refer to the flowsheet for vital signs taken during this treatment.    After treatment:   []  Patient left in no apparent distress sitting up in chair  [x]  Patient left in no apparent distress in bed  [x]  Call bell left within reach  [x]  Nursing  notified  []  Caregiver present  []  Bed alarm activated    COMMUNICATION/EDUCATION:   Patient Education  Education Given To: Patient  Education Provided: Role of Therapy;Plan of Care;ADL ;Fall Prevention Strategies  Education Method: Teach Back  Barriers to Learning: None  Education Outcome: Verbalized understanding    Thank you for this referral.  , OT  Minutes: 31    Eval Complexity: Decision Making: Medium Complexity

## 2022-08-24 NOTE — Plan of Care (Signed)
Problem: Physical Therapy - Adult  Goal: By Discharge: Performs mobility at highest level of function for planned discharge setting.  See evaluation for individualized goals.  Description: Physical Therapy Goals:  Initiated 08/24/2022 to be met within 7-10 days.    1.  Patient will move from supine to sit and sit to supine  in bed with supervision/set-up.    2.  Patient will transfer from bed to chair and chair to bed with minimal assistance/contact guard assist using the least restrictive device.  3.  Patient will perform sit to stand with modified independence.  4.  Patient will ambulate with minimal assistance/contact guard assist for 50 feet with the least restrictive device.     PLOF: lives with daughter, 1 story, level entry, uses walker     Outcome: Progressing   PHYSICAL THERAPY EVALUATION    Patient: [name] Short (67 y.o. female)  Date: 08/24/2022  Primary Diagnosis: Hypomagnesemia [E83.42]  Hypoglycemia [E16.2]  Abnormal CT of the head [R93.0]  Acute UTI [N39.0]  Hypothermia, initial encounter [T68.XXXA]  Altered mental status, unspecified altered mental status type [R41.82]  Cerebrovascular accident (CVA), unspecified mechanism ([place]) [I63.9]  Pressure injury of skin of sacral region, unspecified injury stage [L89.159]  AMS (altered mental status) [R41.82]       Precautions: Fall Risk, Contact Precautions,    ASSESSMENT :  Pt was in left side lying position propped on pillows when therapist entered room. Pt was agreeable to treatment. Pt required mod assist to roll and sit at right EOB. Pt required min assist to stand and CGA to stand pivot transfer using walker to bed side commode. She transferred to and from the commode and standing with stand by assist. She transferred back to EOB with CGA. She required moderate assistance to return to supine in bed and therapist assisted in propping pillows for left side lying at pt request. All needs were left within reach and pt denied any additional needs following  evaluation.    DEFICITS/IMPAIRMENTS:    , Body Structures, Functions, Activity Limitations Requiring Skilled Therapeutic Intervention: Decreased functional mobility ;Decreased strength;Decreased endurance;Decreased balance;Increased pain    Patient will benefit from skilled intervention to address the above impairments.  Patient's rehabilitation potential/Therapy Prognosis: Good.  Factors which may influence rehabilitation potential include:   [x]          None noted  []          Mental ability/status  []          Medical condition  []          Home/family situation and support systems  []          Safety awareness  []          Pain tolerance/management  []          Other:      PLAN :  Recommendations and Planned Interventions:   [x]            Bed Mobility Training             [x]     Neuromuscular Re-Education  [x]            Transfer Training                   []     Orthotic/Prosthetic Training  [x]            Gait Training                          []   Modalities  [x]            Therapeutic Exercises           []     Edema Management/Control  [x]            Therapeutic Activities            [x]     Family Training/Education  [x]            Patient Education  []            Other (comment):    Frequency/Duration: Patient will be followed by physical therapy to address goals, 1-2 times per day/3-5 days per week to address goals.    Further Equipment Recommendations for Discharge: Pt reports having walker at home    AMPAC: AM-PAC Inpatient Mobility Raw Score : 11      Current research shows that an AM-PAC score of 17 (13 without stairs) or less is not associated with a discharge to the patient's home setting. Based on an AM-PAC score and their current functional mobility deficits, it is recommended that the patient have 3-5 sessions per week of Physical Therapy at d/c to increase the patient's independence.     This AMPAC score should be considered in conjunction with interdisciplinary team recommendations to determine the most  appropriate discharge setting. Patient's social support, diagnosis, medical stability, and prior level of function should also be taken into consideration.     SUBJECTIVE:   Patient stated "I have to go to the bathroom."    OBJECTIVE DATA SUMMARY:     Past Medical History:   Diagnosis Date    Anemia     Arthritis     Chronic pain     Legs and Shoulders    Diabetes (HCC)     Exposure to asbestos     History of blood transfusion     History of colon polyps     HTN (hypertension)     Hx of blood clots     DVT    Hyperlipemia     Menopause     Pulmonary nodule     Serum calcium elevated     Sleep apnea     not using cpap    Stroke [facility])      Past Surgical History:   Procedure Laterality Date    BACK SURGERY N/A 06/24/2022    INCISION AND DRAINAGE /DEBRIDEMENT SACRAL DECUBITUS performed by , DO at [facility] MAIN OR    CESAREAN SECTION  1987    COLONOSCOPY N/A 01/13/2017    COLONOSCOPY performed by , MD at [facility]    COLONOSCOPY      DILATION AND CURETTAGE OF UTERUS  1995    RECTAL SURGERY N/A 12/28/2021    SACRAL WOUND DEBRIDEMENT INCISION AND DRAINAGE performed by 06/26/2022, DO at [facility] MAIN OR    SPINE SURGERY N/A 12/09/2021    CERVICAL THREE/FOUR/FIVE/SIX LAMINECTOMY FUSION; C-ARM; STRYKER; EXT BONE GROWTH STIM; 23 HR performed by 03/15/2017, MD at [facility] MAIN OR       Home Situation:  Social/Functional History  Lives With: Family  Type of Home: House  Home Layout: One level  Home Access: Level entry  Receives Help From: Family  ADL Assistance: Independent  Homemaking Assistance: Independent  Ambulation Assistance: Needs assistance (walker)  Transfer Assistance: Independent  Critical Behavior:  Orientation  Overall Orientation Status: Within Normal Limits  Orientation Level: Oriented X4  Cognition  Overall Cognitive Status: WNL  Strength:    Strength: Generally decreased, functional    Tone & Sensation:   Tone: Normal       Range Of Motion:  AROM: Generally decreased,  functional  PROM: Generally decreased, functional    Functional Mobility:  Bed Mobility:     Bed Mobility Training  Bed Mobility Training: Yes  Rolling: Moderate assistance  Supine to Sit: Minimum assistance;Additional time  Sit to Supine: Minimum assistance  Scooting: Minimum assistance  Transfers:     Transfer Training  Transfer Training: Yes  Sit to Stand: Minimum assistance  Stand to Sit: Minimum assistance  Stand Pivot Transfers: Contact-guard assistance;Adaptive equipment (walker)  Toilet Transfer: Stand-by assistance (commode)  Balance:      Balance  Sitting: With support  Standing: Impaired  Standing - Static: Fair  Standing - Dynamic: Fair    Pain:  Pain level pre-treatment: 0/10 reported muscle spasms throughout session which resolved on with time and position change  Pain level post-treatment: 0/10   Pain Intervention(s): Medication (see MAR); Rest, Ice, Repositioning  Response to intervention: Nurse notified     Activity Tolerance:   Activity Tolerance: Patient limited by fatigue;Patient limited by pain;Patient tolerated evaluation without incident  Please refer to the flowsheet for vital signs taken during this treatment.    After treatment:   []          Patient left in no apparent distress sitting up in chair  [x]          Patient left in no apparent distress in bed  [x]          Call bell left within reach  [x]          Nursing notified  []          Caregiver present  [x]          Bed alarm activated  []          SCDs applied    COMMUNICATION/EDUCATION:   Patient Education  Education Given To: Patient  Education Provided: Role of Therapy;Plan of Care;Fall Prevention Strategies  Education Method: Verbal;Teach Back  Barriers to Learning: None  Education Outcome: Verbalized understanding    Thank you for this referral.  Jefm Miles, PT  Minutes: 31      Eval Complexity: Decision Making: Medium Complexity

## 2022-08-24 NOTE — Progress Notes (Addendum)
Comprehensive Nutrition Assessment    Type and Reason for Visit:  Initial, Positive Nutrition Screen    Nutrition Recommendations/Plan:   Continue Current Diet.   Start Oral Nutrition Supplement- Plan to add Juven (each provides 95 kcal, 2.5g protein)  BID, per notes fruit punch flavored Juven only   Continue to monitor tolerance of PO, compliance of oral supplements, weight, labs, and plan of care during admission.         Malnutrition Assessment:  Malnutrition Status:  Insufficient data (08/24/22 1532)      Nutrition History and Allergies:   PMHx: Arthritis, DMT2, hypertension, hyperlipidemia, sleep apnea noncompliant with CPAP, stroke. Nutrition hx obtained via chart review. Per RD 06/23/22 note- Pt reports UBW of ~154 lbs x unknown timeframe and fluctuating weight. Wt hx: 168 lb (08/24/22), 178 lb (06/22/22), weight loss of 10 lb (5.6%) x 2 months, 159 lb (02/12/22), weight gain of 9 lb (5.7%) x 6 months, 154 lb (08/29/21), weight gain of 14 lb (9%) x 1 year. Pt with non significant weight loss. Will continue to follow weight trends. Lactose allergy noted.     Nutrition Assessment:    Pt presents after being found unresponsive for a brief period and then altered by caregiver. Pt admitted for altered mental status. Positive nutrition screen noted, pressure injury & cultural, religious/ethnic food preferences. Per SLP 1/20 note- recommend reg solid diet with thin liquids. Remote visit. Attempted to contact pt via phone with no success. (1) meal documented in flowsheets, pt consumed 51-75% of lunch on 08/24/22. Will add oral supplements to increase calorie/protein intake opportunity.  Per RD 06/23/22 note, pt requesting fruit punch flavored Juven only. Will attempt to obtain cultural, religious/ethnic food preferences, nutrition hx and conduct full NFPE on follow-up. Will continue to monitor intake.    Nutrition Related Findings:    Last BM (including prior to admit): 08/24/22. Edema: No data recorded. Pertinent  Meds: Lipitor, pepcid, iron 325, mag-ox, effer-k, K Phos, zosyn. Pertinent Labs: K 2.7 (L) replaced, Mag 1.6 (wnl), calcium 8.4 (L) no albumin to correct, Phos 2.1 (L) replaced, POC Glucose 63-163 mg/dl x 24 hrs, A1c 6.9 (wnl-DM) Wound Type: Pressure Injury, Stage IV       Current Nutrition Intake & Therapies:    Average Meal Intake: 51-75%  Average Supplements Intake: None Ordered  ADULT ORAL NUTRITION SUPPLEMENT; Breakfast, Dinner; Wound Healing Oral Supplement  ADULT DIET; Regular; 3 carb choices (45 gm/meal); Low Fat/Low Chol/High Fiber/NAS; Fruit Punch Juven    Anthropometric Measures:  Height: 170.2 cm (5' 7.01")  Ideal Body Weight (IBW): 135 lbs (61 kg)    Admission Body Weight: 68 kg (150 lb) (Stated)  Current Body Weight: 76.2 kg (168 lb), 124.4 % IBW. Weight Source: Bed Scale  Current BMI (kg/m2): 26.3  Usual Body Weight: 69.9 kg (154 lb)  % Weight Change (Calculated): 9.1  Weight Adjustment For: No Adjustment  BMI Categories: Overweight (BMI 25.0-29.9)    Estimated Daily Nutrient Needs:  Energy Requirements Based On: Formula  Weight Used for Energy Requirements: Current  Energy (kcal/day): 3086-5784 (MSJ x 1.2-1.4)  Weight Used for Protein Requirements: Current  Protein (g/day): 92-107 (1.2-1.4)  Method Used for Fluid Requirements: 1 ml/kcal  Fluid (ml/day): 6962-9528    Nutrition Diagnosis:   Increased nutrient needs related to increase demand for energy/nutrients as evidenced by wounds  Altered nutrition-related lab values related to acute injury/trauma as evidenced by lab values    Nutrition Interventions:   Food and/or Nutrient Delivery: Continue Current  Diet, Start Oral Nutrition Supplement  Nutrition Education/Counseling: Education not indicated  Coordination of Nutrition Care: Continue to monitor while inpatient       Goals:     Goals: Meet at least 75% of estimated needs, by next RD assessment       Nutrition Monitoring and Evaluation:   Behavioral-Environmental Outcomes: None  Identified  Food/Nutrient Intake Outcomes: Food and Nutrient Intake, Supplement Intake  Physical Signs/Symptoms Outcomes: Biochemical Data, Chewing or Swallowing, Constipation, Diarrhea, GI Status, Nausea or Vomiting, Fluid Status or Edema, Hemodynamic Status, Meal Time Behavior, Skin, Weight    Discharge Planning:    Continue Oral Nutrition Supplement, Continue current diet     [place], Kennard  Contact: [phone]

## 2022-08-24 NOTE — Progress Notes (Addendum)
2114hrs:    TRANSFER - IN REPORT:  Verbal report received from [facility]., RN on [name] Short  being received from [place], [place] for routine progression of patient care      Report consisted of patient's Situation, Background, Assessment and   Recommendations(SBAR).     Information from the following report(s) Nurse Handoff Report, Index, ED Encounter Summary, Adult Overview, Surgery Report, Intake/Output, MAR, Recent Results, and Cardiac Rhythm NSR/ST  was reviewed with the receiving nurse.    Opportunity for questions and clarification was provided.      Assessment completed upon patient's arrival to unit and care assumed.      0010hrs:    4 Eyes Skin Assessment     NAME:  [name] Short  DATE OF BIRTH:  09-08-1955  MEDICAL RECORD NUMBER:  [id number]    The patient is being assessed for  Admission    I agree that at least one RN has performed a thorough Head to Toe Skin Assessment on the patient. ALL assessment sites listed below have been assessed.      Areas assessed by both nurses:    Head, Face, Ears, Shoulders, Back, Chest, Arms, Elbows, Hands, Sacrum. Buttock, Coccyx, Ischium, and Legs. Feet and Heels        Does the Patient have a Wound? Yes wound(s) were present on assessment. LDA wound assessment was Initiated and completed by RN       [place]en Prevention initiated by RN: Yes  Wound Care Orders initiated by RN: Yes    Pressure Injury (Stage 3,4, Unstageable, DTI, NWPT, and Complex wounds) if present, place Wound referral order by RN under ORDER ENTRY: Yes    New Ostomies, if present place, Ostomy referral order under ORDER ENTRY: No     Nurse 1:  [place], RN  Nurse 2:  Legrand Como, RN    [id number]:  Review of pt's labs, pt Potassium 2.7; Albumin 3.1 (08/23/22); and Positive SARS-CoV-2, PCR.    [id number]:  MD paged.    0718hrs:  Spoke with Dr. Bernadene Bell regarding pt's abnormal labs.  RN advised MD that pt can swallow.  No telephone orders were received.       0800hrs:  Bedside and Verbal shift change report given to Gregary Signs, Therapist, sports  (oncoming nurse) by Renold Don, RN (offgoing nurse). Report included the following information Nurse Handoff Report, Index, ED Encounter Summary, Adult Overview, Surgery Report, Intake/Output, MAR, Recent Results, and Cardiac Rhythm NSR/ST .

## 2022-08-25 ENCOUNTER — Encounter: Payer: PRIVATE HEALTH INSURANCE | Primary: Physician Assistant

## 2022-08-25 ENCOUNTER — Inpatient Hospital Stay: Admit: 2022-08-25 | Discharge: 2022-08-29 | Payer: PRIVATE HEALTH INSURANCE | Primary: Physician Assistant

## 2022-08-25 LAB — RENAL FUNCTION PANEL
Albumin: 2.5 g/dL — ABNORMAL LOW (ref 3.4–5.0)
Anion Gap: 1 mmol/L — ABNORMAL LOW (ref 3.0–18)
BUN/Creatinine Ratio: 20 (ref 12–20)
BUN: 20 mg/dL — ABNORMAL HIGH (ref 7.0–18)
CO2: 29 mmol/L (ref 21–32)
Calcium: 8.7 mg/dL (ref 8.5–10.1)
Chloride: 111 mmol/L (ref 100–111)
Creatinine: 0.98 mg/dL (ref 0.6–1.3)
Est, Glom Filt Rate: >60 mL/min/{1.73_m2} (ref >60)
Glucose: 154 mg/dL — ABNORMAL HIGH (ref 74–99)
Phosphorus: 2 mg/dL — ABNORMAL LOW (ref 2.5–4.9)
Potassium: 4 mmol/L (ref 3.5–5.5)
Sodium: 141 mmol/L (ref 136–145)

## 2022-08-25 LAB — POCT GLUCOSE
POC Glucose: 301 mg/dL — ABNORMAL HIGH (ref 70–110)
POC Glucose: 261 mg/dL — ABNORMAL HIGH (ref 70–110)
POC Glucose: 167 mg/dL — ABNORMAL HIGH (ref 70–110)
POC Glucose: 220 mg/dL — ABNORMAL HIGH (ref 70–110)

## 2022-08-25 LAB — CBC
Hematocrit: 32.6 % — ABNORMAL LOW (ref 35.0–45.0)
Hemoglobin: 10.3 g/dL — ABNORMAL LOW (ref 12.0–16.0)
MCH: 24.5 pg (ref 24.0–34.0)
MCHC: 31.6 g/dL (ref 31.0–37.0)
MCV: 77.6 FL — ABNORMAL LOW (ref 78.0–100.0)
MPV: 10.1 FL (ref 9.2–11.8)
Nucleated RBCs: 0 /100{WBCs}
Platelets: 210 10*3/uL (ref 135–420)
RBC: 4.2 M/uL (ref 4.20–5.30)
RDW: 15.9 % — ABNORMAL HIGH (ref 11.6–14.5)
WBC: 4.9 10*3/uL (ref 4.6–13.2)
nRBC: 0 10*3/uL (ref 0.00–0.01)

## 2022-08-25 LAB — PROCALCITONIN: Procalcitonin: 0.2 ng/mL

## 2022-08-25 LAB — MAGNESIUM: Magnesium: 1.1 mg/dL — ABNORMAL LOW (ref 1.6–2.6)

## 2022-08-25 MED ORDER — INSULIN LISPRO 100 UNIT/ML IJ SOLN
100 UNIT/ML | Freq: Three times a day (TID) | INTRAMUSCULAR | 0 refills | Status: AC
Start: 2022-08-25 — End: ?

## 2022-08-25 MED ORDER — HYDRALAZINE HCL 25 MG PO TABS
25 MG | ORAL_TABLET | Freq: Three times a day (TID) | ORAL | 3 refills | Status: AC
Start: 2022-08-25 — End: ?

## 2022-08-25 MED ORDER — POTASSIUM PHOSPHATE 3 MMOL/ML IV SOLN (MIXTURES ONLY)
155 mmol/5 mL | Freq: Once | INTRAVENOUS | Status: AC
Start: 2022-08-25 — End: 2022-08-25
  Administered 2022-08-25: 16:00:00 15 mmol via INTRAVENOUS

## 2022-08-25 MED ORDER — HYDRALAZINE HCL 25 MG PO TABS
25 MG | Freq: Three times a day (TID) | ORAL | Status: DC
Start: 2022-08-25 — End: 2022-08-25
  Administered 2022-08-25: 18:00:00 25 mg via ORAL

## 2022-08-25 MED ORDER — CARVEDILOL 6.25 MG PO TABS
6.25 MG | Freq: Two times a day (BID) | ORAL | Status: DC
Start: 2022-08-25 — End: 2022-08-25
  Administered 2022-08-25 (×2): 6.25 mg via ORAL

## 2022-08-25 MED ORDER — HYDRALAZINE HCL 10 MG PO TABS
10 MG | Freq: Three times a day (TID) | ORAL | Status: DC
Start: 2022-08-25 — End: 2022-08-25

## 2022-08-25 MED ORDER — SODIUM CHLORIDE 0.9 % IV SOLN
0.9 % | Freq: Once | INTRAVENOUS | Status: DC
Start: 2022-08-25 — End: 2022-08-25

## 2022-08-25 MED ORDER — INSULIN GLARGINE 100 UNIT/ML SC SOLN
100 UNIT/ML | Freq: Every day | SUBCUTANEOUS | 0 refills | Status: AC
Start: 2022-08-25 — End: ?

## 2022-08-25 MED FILL — POTASSIUM PHOSPHATES 15 MMOLE/5ML IV SOLN: 15 MMOLE/5ML | INTRAVENOUS | Qty: 5

## 2022-08-25 MED FILL — FAMOTIDINE 20 MG PO TABS: 20 MG | ORAL | Qty: 1

## 2022-08-25 MED FILL — SULFAMETHOXAZOLE-TRIMETHOPRIM 400-80 MG PO TABS: 400-80 MG | ORAL | Qty: 2

## 2022-08-25 MED FILL — SENSIPAR 60 MG PO TABS: 60 MG | ORAL | Qty: 1

## 2022-08-25 MED FILL — FERROUS SULFATE 325 (65 FE) MG PO TABS: 325 (65 Fe) MG | ORAL | Qty: 1

## 2022-08-25 MED FILL — HYDRALAZINE HCL 25 MG PO TABS: 25 MG | ORAL | Qty: 1

## 2022-08-25 MED FILL — CARVEDILOL 6.25 MG PO TABS: 6.25 MG | ORAL | Qty: 1

## 2022-08-25 MED FILL — ATORVASTATIN CALCIUM 40 MG PO TABS: 40 MG | ORAL | Qty: 2

## 2022-08-25 MED FILL — EFFER-K 20 MEQ PO TBEF: 20 MEQ | ORAL | Qty: 2

## 2022-08-25 MED FILL — BACLOFEN 10 MG PO TABS: 10 MG | ORAL | Qty: 1

## 2022-08-25 MED FILL — MAGNESIUM OXIDE -MG SUPPLEMENT 400 (240 MG) MG PO TABS: 400 (240 Mg) MG | ORAL | Qty: 1

## 2022-08-25 MED FILL — ASPIRIN ADULT 325 MG PO TABS: 325 MG | ORAL | Qty: 1

## 2022-08-25 NOTE — Progress Notes (Addendum)
1930hrs:  Bedside and Verbal shift change report given to Tula Schryver, RN (Soil scientist) by Gregary Signs, RN (offgoing nurse). Report included the following information Nurse Handoff Report, Index, ED Encounter Summary, Adult Overview, Surgery Report, Intake/Output, MAR, Recent Results, and Cardiac Rhythm NSR .     Pt in bed, with eyes closed.  Pt is easily aroused.  Daughter at bedside also resting.  No s/s distress noted.     2230hrs:  NIHSS Stoke Scale completed per MD order; pt scored 1.  Flowsheet updated accordingly.    0056hrs:  Pt c/o 8 out of 10 spasm pain to bilat lower extremities.  PRN PO Baclofen admin'd per MD order.     0745hrs:  Bedside and Verbal shift change report given to Rhoda, RN (Soil scientist) by Renold Don, RN (offgoing nurse). Report included the following information Nurse Handoff Report, Index, ED Encounter Summary, Adult Overview, Surgery Report, Intake/Output, MAR, Recent Results, and Cardiac Rhythm NSR .

## 2022-08-25 NOTE — Care Coordination-Inpatient (Signed)
Case Management Assessment  Initial Evaluation    Date/Time of Evaluation: 08/25/2022 11:12 AM  Assessment Completed by: Denzil Hughes, LSW    If patient is discharged prior to next notation, then this note serves as note for discharge by case management.    Patient Name: [name] Short                   Date of Birth: 1955-11-22  Diagnosis: Hypomagnesemia [E83.42]  Hypoglycemia [E16.2]  Abnormal CT of the head [R93.0]  Acute UTI [N39.0]  Hypothermia, initial encounter [T68.XXXA]  Altered mental status, unspecified altered mental status type [R41.82]  Cerebrovascular accident (CVA), unspecified mechanism ([place]) [I63.9]  Pressure injury of skin of sacral region, unspecified injury stage [L89.159]  AMS (altered mental status) [R41.82]                   Date / Time: 08/23/2022 12:50 PM    Patient Admission Status: Inpatient   Readmission Risk (Low < 19, Mod (19-27), High > 27): Readmission Risk Score: 25.3    Current PCP: Ignacia Palma, PA  PCP verified by CM?      Chart Reviewed: Yes      History Provided by: (P) Patient  Patient Orientation: (P) Alert and Oriented, Person    Patient Cognition: (P) Alert    Hospitalization in the last 30 days (Readmission):  No    If yes, Readmission Assessment in CM Navigator will be completed.    Advance Directives:      Code Status: Full Code   Patient's Primary Decision Maker is: (P) Named in Scanned ACP Document    Primary Decision MakerZoii, Florer - Child [phone]    Secondary Decision Maker: Barbaraann Barthel [phone]    Discharge Planning:    Patient lives with: (P) Children Type of Home: (P) House  Primary Care Giver: (P) Family (Daughter [place])  Patient Support Systems include: (P) Children   Current Financial resources:    Current community resources:    Current services prior to admission: (P) C-pap, Wound Vac, Home Care            Current DME:              Type of Home Care services:  (P) OT, PT, Nursing Services    ADLS  Prior functional level: (P) Independent  in ADLs/IADLs  Current functional level: (P) Assistance with the following:, Mobility    PT AM-PAC: 11 /24  OT AM-PAC: 17 /24    Family can provide assistance at DC: (P) Yes  Would you like Case Management to discuss the discharge plan with any other family members/significant others, and if so, who? (P) Yes (Daughter Jiles Garter)  Plans to Return to Present Housing: (P) Yes  Other Identified Issues/Barriers to RETURNING to current housing: Pt dispo home, Daughter Jiles Garter will transport.   Potential Assistance needed at discharge: (P) Outpatient PT/OT, Home Care            Potential DME:    Patient expects to discharge to: (P) [place] for transportation at discharge: (P) Family    Financial    Payor: Carmin [place]rat / Plan: [place] POS / Product Type: *No Product type* /     Does insurance require precert for SNF: Yes    Potential assistance Purchasing Medications:    Meds-to-Beds request:        [id number] [place], [address]  [place]  [place] [address]  Phone: [phone] Fax: [phone]      Notes:    Factors facilitating achievement of predicted outcomes: Family support, Motivated, Cooperative, and Pleasant    Barriers to discharge: None    Additional Case Management Notes:     The Plan for Transition of Care is related to the following treatment goals of Hypomagnesemia [E83.42]  Hypoglycemia [E16.2]  Abnormal CT of the head [R93.0]  Acute UTI [N39.0]  Hypothermia, initial encounter [T68.XXXA]  Altered mental status, unspecified altered mental status type [R41.82]  Cerebrovascular accident (CVA), unspecified mechanism ([place]) [I63.9]  Pressure injury of skin of sacral region, unspecified injury stage [L89.159]  AMS (altered mental status) [S01.09]    IF APPLICABLE: The Patient and/or patient representative [place] and her family were provided with a choice of provider and agrees with the discharge plan. Freedom of choice list with basic dialogue that  supports the patient's individualized plan of care/goals and shares the quality data associated with the providers was provided to:     Patient Representative Name:       The Patient and/or Patient Representative Agree with the Discharge Plan?  Yes   08/25/22 1109   Service Assessment   Patient Orientation Alert and Oriented;Person   Cognition Alert   History Provided By Patient   Primary Caregiver Family  (Daughter [place])   [place] is: Named in [place] in ADLs/IADLs   Current Functional Level Assistance with the following:;Mobility   Can patient return to prior living arrangement Yes   Ability to make needs known: Good   Family able to assist with home care needs: Yes   Would you like for me to discuss the discharge plan with any other family members/significant others, and if so, who? Yes  (Daughter [place])   Social/Functional History   Lives With Family   Type of [place] One level   Home Access Stairs to enter with rails   Entrance Stairs - Number of Steps 3   Bathroom Shower/Tub Tub/Shower unit   Educational psychologist None   Academic librarian, [place] Help From Family   ADL Assistance Independent   Copywriter, advertising Yes   Ambulation Assistance Needs assistance   Secretary/administrator Yes   Mode of Scientist, product/process development   Occupation Full time employment   Type of Occupation [employer]   Discharge Planning   Type of [place] Prior To Admission C-pap;Wound Vac;Home Care   Potential Assistance Needed Outpatient PT/OT;Home Care   DME Ordered? Cpap;Shower chair;Hospital bed;Bedside commode;Wound Vac   Type of Home Care Services OT;PT;Nursing Services   Patient expects to be discharged to: House    One/Two Story Residence One story   Services At/After Discharge   Services At/After Discharge PT;OT   [employer] Information Provided? No   Mode of Transport at Discharge Other (see comment)  (Daughter will transport)   Confirm Follow Up Transport Family   Condition of Participation: Discharge Planning   The Plan for Transition of Care is related to the following treatment goals: Pt dispo. home with [place], [place]  Case Management Department  Ph: [phone]

## 2022-08-25 NOTE — Plan of Care (Signed)
Problem: Safety - Adult  Goal: Free from fall injury  Outcome: Completed     Problem: Discharge Planning  Goal: Discharge to home or other facility with appropriate resources  Outcome: Completed     Problem: Pain  Goal: Verbalizes/displays adequate comfort level or baseline comfort level  Outcome: Completed     Problem: Chronic Conditions and Co-morbidities  Goal: Patient's chronic conditions and co-morbidity symptoms are monitored and maintained or improved  Outcome: Completed     Problem: Nutrition Deficit:  Goal: Optimize nutritional status  Outcome: Completed     Problem: Skin/Tissue Integrity  Goal: Absence of new skin breakdown  Description: 1.  Monitor for areas of redness and/or skin breakdown  2.  Assess vascular access sites hourly  3.  Every 4-6 hours minimum:  Change oxygen saturation probe site  4.  Every 4-6 hours:  If on nasal continuous positive airway pressure, respiratory therapy assess nares and determine need for appliance change or resting period.  Outcome: Completed

## 2022-08-25 NOTE — Consults (Signed)
Room #: 016      HAR: [id number]      Situation: Wound Care Consult    Background:    PMH:   Active Ambulatory Problems     Diagnosis Date Noted    Iron deficiency anemia due to chronic blood loss 12/21/2016    Diabetes mellitus, type 2 ([place])     Occult blood in stools 12/21/2016    Stroke ([place]) 04/23/2013    Symptomatic anemia 12/20/2016    Cervical myelopathy ([place]) 09/23/2021    Obstructive sleep apnea syndrome 09/23/2021    Spinal stenosis of lumbar region 09/23/2021    Syncope 09/08/2021    Cerebrovascular accident ([place]) 09/23/2021    Failure to thrive in adult 12/27/2021    Skin ulcer of sacrum ([place]) 12/28/2021    RUQ pain 12/28/2021    Decubitus ulcer of sacral region, stage 4 ([place]) 12/28/2021    Lacunar infarct, acute ([place]) 12/31/2021    Acute CVA (cerebrovascular accident) ([place]) 01/06/2022    Sepsis ([place]) 06/20/2022    Severe sepsis ([place]) 06/20/2022    Hypertension 06/20/2022    History of CVA (cerebrovascular accident) 06/20/2022    Hyperlipidemia 06/20/2022    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   Preventive measures in place: limited layer, [place]    Assessment:reclined   Patient found reclined.  Patient is Oriented x person, place, time and situation and Pleasant and conversant.     Wound(s) Description:           Negative Pressure Wound Therapy Sacrum (Active)   $ Standard NPWT >50 sq cm PER TX $ Yes 08/25/22 1042   Wound Type Pressure ulcer: Stage IV 08/25/22 0745   Unit Type  Activac 08/25/22 1042   Dressing Type Black Foam 08/25/22 1042   Number of pieces used 3 08/25/22 1042   Number of pieces removed 2 08/24/22 0010   Cycle Continuous 08/25/22 1042   Target Pressure (mmHg) 125 08/25/22 1042   Irrigation Solution Other (comment) 08/24/22 0010   Canister changed? No 08/25/22 1042   Dressing Status New dressing applied 08/25/22 1042   Dressing Changed Changed/New 08/25/22 1042   Drainage Amount Small 08/25/22 1042   Drainage Description Serosanguinous 08/25/22 1042   Dressing Change Due 08/27/22 08/25/22 1042   Wound Assessment Bleeding;Granulation tissue 08/24/22 0010   Peri-wound Assessment Excoriated 08/24/22 0010   Shape irregular shaped 08/19/22 1440   Odor None 08/24/22 0010   Number of days: 61       Wound 08/13/22 Sacrum (Active)   Wound Image   08/25/22 1041   Wound Etiology Pressure Stage 4 08/25/22 1041   Dressing Status  New dressing applied 08/25/22 1041   Wound Cleansed Wound cleanser 08/25/22 1041   Dressing/Treatment Negative pressure wound therapy 08/25/22 1041   Wound Length (cm) 7.1 cm 08/25/22 1041   Wound Width (cm) 1.9 cm 08/25/22 1041   Wound Depth (cm) 1 cm 08/25/22 1041   Wound Surface Area (cm^2) 13.49 cm^2 08/25/22 1041   Change in Wound Size % (l*w) 78.59 08/25/22 1041   Wound Volume (cm^3) 13.49 cm^3 08/25/22 1041   Wound Healing % 93 08/25/22 1041   Wound Assessment Granulation tissue 08/25/22 1041   Drainage Amount Small (< 25%) 08/25/22 1041   Drainage Description Serosanguinous 08/25/22 1041   Odor None 08/25/22 1041   Peri-wound Assessment Intact;Hyperpigmented;Hyperkeratosis (callous) 08/25/22 1041   Number of days: 11          Recommendations:    Wound care orders as follows: For Hospital: Unit nurse to provide wound vac dressing to sacrum every Monday, Wednesday and Friday and PRN. Clean with wound spray then apply vac.  Settings 178mmhg continuous.  **Clean machines are stored in central sterile, first floor** Upon discharge from [facility], place  hospital machine in dirty utility room under white laminated sign. May apply daily saline dressing if home wound vac unavailable.    For Home Health: Skilled nurse to perform [facility] dressing change to sacrum  3 times weekly and prn seal breach or vac malfunction. May soak dressing in saline prior to removal if necessary. Cleanse with NS or wound cleanser and apply Wound Vac at 125 - 175 mmHG  continuous suction. May titrate up or down by 84mmHg  to obtain seal, for increased or decreased amount of drainage. May apply prn non adherent dressing to protect tendons, ligaments, bone, areas not intended for granulation. Use white foam for all tunnels and undermining or areas where wound base is not easily visualized. When using white foam, increase setting to 19mmhg or greater. May apply NS wet to dry dressing prn wound vac problems. Instruct pt/caregiver regarding procedures in case of wound vac problems.       Supplies Used: vac kit, gauze, strip paste       Care turned over to nursing staff at this time.    Velora Heckler. Cobbs BSN, RN, [facility], [place] [facility] Dept  Office: [phone]  Work Cell: [phone]

## 2022-08-25 NOTE — IP Home Care (Signed)
Received resumption of care orders for SN / PT / OT.  Wound Vac.  Spoke with Dtr Jiles Garter  -  verified - dressing to sacral has been changed today and home wound vac re-applied.  Needing ROC visit on Wednesday.  Please provide additional education and encouragement on DM2.    Referral has been updated and sent to central intake and scheduling.         ---   Melika Reder Arnell Asal, [place]

## 2022-08-25 NOTE — Care Coordination-Inpatient (Addendum)
Cm spoke with the patient daughter Jiles Garter About SNF recs. from PT/OT. Jiles Garter stated she would prefer home with home health instead. She informed Cm that her mother already receives services through [facility] and Jiles Garter would like those services to be resumed.     Cm notified Dr. Martina Sinner via [employer].       12:02 pm    Discharge order noted for today. Orders received. No needs identified at this time. Case management remains available as needed. Daughter will pick up patient.          Denzil Hughes, MSW

## 2022-08-25 NOTE — Discharge Summary (Signed)
Discharge Summary    Patient: [name] Short MRN: [id number]  CSN: [id number]    Date of Birth: 1956-01-21  Age: 67 y.o.  Sex: female    DOA: 08/23/2022 LOS:  LOS: 2 days        Disposition: Home with [facility]    Discharge Date: 08/25/2022    Admission Diagnosis: Hypomagnesemia [E83.42]  Hypoglycemia [E16.2]  Abnormal CT of the head [R93.0]  Acute UTI [N39.0]  Hypothermia, initial encounter [T68.XXXA]  Altered mental status, unspecified altered mental status type [R41.82]  Cerebrovascular accident (CVA), unspecified mechanism ([place]) [I63.9]  Pressure injury of skin of sacral region, unspecified injury stage [L89.159]  AMS (altered mental status) [R41.82]    Discharge Diagnosis:    Acute metabolic encephalopathy due to hypoglycemia, resolved now  Hypoglycemia  SIRS due to hypoglycemia, sepsis ruled out  Sacral decubitus ulcer with wound VAC, no signs of infection  Recent COVID-19 infection  OSA on CPAP  History of CVA with right-sided weakness    Discharge Condition: Stable      PHYSICAL EXAM  Visit Vitals  BP (!) 175/76   Pulse 74   Temp 98 F (36.7 C) (Oral)   Resp 18   Ht 1.702 m (5' 7.01")   Wt 78.9 kg (173 lb 14.4 oz)   SpO2 98%   BMI 27.23 kg/m       General: Alert, cooperative, no acute distress    HEENT: PERRLA, EOMI. Anicteric sclerae.  Lungs:  CTA Bilaterally. No Wheezing/Rales.  Heart:             Regular rate and Rhythm.  Abdomen: Soft, Non distended, Non tender. + Bowel sounds.  Extremities: No edema.  Psych:   Good insight. Not anxious or agitated.  Neurologic:  AA, oriented X 3.  Right-sided weakness         Sacral ulcer with wound VAC, no signs of infection                       Hospital Course:   Karsynn Deweese is a 67 y.o. female with a PMHx of arthritis, DMT2, hypertension, hyperlipidemia, sleep apnea noncompliant with CPAP, stroke with right sided weakness who presented to the ED after being found unresponsive for a brief period and then altered by caregiver today. EMS was called and patient BS was 32 upon  their arrival. Patient was treated appropriately for hypoglycemia. Patient is noted to have a sacral wound vac in place since ay 2023. Denies being on abx at home. Daughter at bedside who assisted in providing history. Daughter feeding patient at bedside and patient tolerating food without coughing or choking. Currently upon my evaluation, patient resting in no distress, eating dinner. VSS. Receiving IV mag sulfate. Per daughter patient last took her 30 U Lantus at 12:30 AM last night, did not have a bedtime snack. This morning patient had breast at 7 :30 AM and had her SSI. Initially in the morning her BS was 119, but subsequently she came diaphoretic, altered, unresponsive. Patient also has RLE weakness at baseline and uses a walker at home to walk. Today, she felt increased weakness to RLE.      In the ED, temp 94.6-->97.8, Resp 10-13, HR 50-70, BP 179/81, 100% on RA. Mag 1.2, Lactic acid 2.4--> 0.8. Procal < 0.05. Trop x2 negative. Glucose 49-->127. Chronically elevated liver enzymes. CBC without abnormality. Blood cultures x 2 obtained. Urine culture obtained. CXR negative. EKG with sinus bradycardia. UA with positive nitrite, no WBC.  CT head with subacute infarct. CTA head and neck preliminary read with no LVO. Neurology has been consulted by the ED.   Received ASA 325, Ceftriaxone, Plavix, D10, Glucagon, Hydralazine, Mag Sulfate, 2 L NS bolus.     Patient was admitted to hospital with impression of SIRS, rule out sepsis, rule out CVA.  MRI was ordered, neurology was consulted.  Patient was started on empiric antibiotics, IV fluids.  Lantus was held.  Patient's mental status improved, blood sugar remained stable.  Neurology saw the patient and agree with MRI.  MRI came back negative.  Neurology thought patient's symptoms most likely from hypoglycemia did not suspect seizures or CVA.  Neuro recommended no further intervention.  Infection workup negative.  UA was negative, urine culture growing gram-negative  rods but patient asymptomatic and no pyuria suspect colonization.  Sacral wound no signs of infection.  Blood sugars trended up now, will start low-dose of Lantus.  Blood pressure trending up, will resume Coreg and also add hydralazine.  Patient currently does not have any signs of infection, she is hemodynamically medically stable for discharge.  Patient will be discharged home with outpatient follow-up.    Discussed with patient and also with her daughter over the phone about her discharge plan, follow-up appointments, new medications and side effects.  Discussed with the patient and daughter about starting Lantus at 10 units and slowly increase by 2 units if blood sugars more than 200.  Also advised her to decrease lispro to 3 units.  Patient and daughter complaint understood and agreed with the plan.  Answered all their questions and concerns appropriate.    Procedures: None     Consults:   Dr. Bridgett Larsson, neurology    Imaging studies:   MRI Result (most recent):  MRI BRAIN WO CONTRAST 08/23/2022    Narrative  EXAM: MRI BRAIN WO CONTRAST    CLINICAL INDICATION/HISTORY: Altered mental status, prior brainstem infarction    TECHNIQUE: Multisequence multiplanar MR imaging acquired through the brain.    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  infarction these are stable    Cerebellum demonstrates old watershed infarction on the left    Diffusion images are negative for any acute area of restricted diffusion    Gradient images unremarkable    Deep white matter demonstrates scattered plaque and punctate areas of gliotic  change on the right and left, left more burden noted    Overall mild to moderate burden chronic small vessel disease    Hemispheres reflect a moderate generalized atrophy    There is also more preferential volume loss in the hippocampal and medial  temporal  region    Frontal and parietal lobes are relatively spared    No acute infarction. No acute hemorrhage. No mass lesion.    CSF spaces: There is a large calcification along the dural to the left of  midline involving frontal parietal region consistent with a large benign dural  plaque    Measuring 40 x 7.7 mm    Underlying en plaque meningioma cannot be excluded, not felt to be clinically  significant finding    IAC regions: Unremarkable    Parasellar region: Unremarkable    Vasculature: Appropriate flow voids within the major skull base vasculature.    Cervicomedullary junction: Junction intact however there is evidence for  multiple prior posterior cervical pedicle screw fixations from C3 inferiorly    There is also a large herniation at C3-4 and C4-5 with at least mild compression  of the cord, these changes are stable from the May 2023 exam    Orbits: Unremarkable    Paranasal sinuses: Clear    Impression  Evidence for significant old brainstem infarction right and left side    Moderate burden chronic small vessel disease    Moderate generalized atrophy with more focal hippocampal and middle temporal  lobe volume loss    Question possibility of clinical mild cognitive impairment versus early  Alzheimer's presentation    No acute infarction, bleed or mass is seen        Discharge Medications:     Current Discharge Medication List             Details   insulin glargine (LANTUS) 100 UNIT/ML injection vial Inject 10 Units into the skin daily  Qty: 1 each, Refills: 0      insulin lispro (HUMALOG) 100 UNIT/ML SOLN injection vial Inject 3 Units into the skin 3 times daily (with meals)  Qty: 1 each, Refills: 0      hydrALAZINE (APRESOLINE) 25 MG tablet Take 1 tablet by mouth every 8 hours  Qty: 90 tablet, Refills: 3                Details   potassium chloride (KLOR-CON M) 10 MEQ extended release tablet Take 1 tablet by mouth daily      cinacalcet (SENSIPAR) 30 MG tablet Take 1 tablet by mouth 2 times daily       lactobacillus (CULTURELLE) capsule Take 1 capsule by mouth daily (with breakfast)  Qty: 60 capsule, Refills: 0      carvedilol (COREG) 6.25 MG tablet Take 1 tablet by mouth 2 times daily (with meals)  Qty: 60 tablet, Refills: 3      baclofen (LIORESAL) 10 MG tablet Take 1 tablet by mouth 3 times daily as needed (PAIN) Indications: Pain FOR PAIN PER SHETH      Magnesium Oxide 400 MG CAPS Take 1 capsule by mouth daily.      aspirin 325 MG tablet Take 1 tablet by mouth daily      acetaminophen (TYLENOL) 500 MG tablet Take 1 tablet by mouth every 6 hours as needed for Fever or Pain  Qty: 30 tablet, Refills: 0      atorvastatin (LIPITOR) 80 MG tablet Take 1 tablet by mouth nightly  Qty: 30 tablet, Refills: 3      ferrous sulfate (IRON 325) 325 (65 Fe) MG tablet Take 1 tablet by mouth daily  Qty: 30 tablet      Multiple Vitamins-Minerals (THERAPEUTIC MULTIVITAMIN-MINERALS) tablet Take 1 tablet by mouth daily      furosemide (LASIX) 40 MG tablet furosemide 40 mg tablet   TAKE 1 TABLET BY MOUTH EVERY DAY             It is important that you take the medication exactly as they are prescribed.   Keep your medication in the bottles provided by the pharmacist and keep a list of the medication names, dosages, and times to be taken in your wallet.   Do not take other medications without consulting your doctor.     DIET:  cardiac diet and diabetic diet    ACTIVITY: activity as tolerated  Home health care for Skilled care for DM, Hypertension and medication management  PT/OT consult   Wound vac care consult      ADDITIONAL INFORMATION: If you experience any of the following symptoms but not limited to Fever, chills, nausea, vomiting, diarrhea, change in mentation, falling, bleeding, shortness of breath, chest pain, please call your primary care physician or return to the emergency room if you cannot get hold of your doctor:       FOLLOW UP CARE:   Follow-up Information     Will Bonnet, PA. Schedule an appointment as soon  as possible for a   visit in 1 week(s).    Specialty: Physician Assistant  Contact information:  [address]  [place]  [place] [address]  [phone]             Girtha Rm, MD. Schedule an appointment as soon as possible   for a visit in 2 week(s).    Specialty: Endocrinology  Contact information:  [address]  [place] [address]  [phone]                       Minutes spent on discharge: 40 minutes spent coordinating this discharge (review instructions/follow-up, prescriptions, preparing report for sign off)    Woodward Ku, MD  08/25/2022 12:05 PM    Disclaimer: Sections of this note are dictated using utilizing voice recognition software. Minor typographical errors may be present. If questions arise, please do not hesitate to contact me or call our department.

## 2022-08-25 NOTE — Case Communication (Signed)
Patient admitted to [facility] 08/23/22 due to [place], transfer completed 08/25/22.

## 2022-08-25 NOTE — Discharge Instructions (Addendum)
Discharge Instructions    Patient: [name] Short MRN: [id number]  CSN: [id number]    Date of Birth: May 11, 1956  Age: 67 y.o.  Sex: female    DOA: 08/23/2022       DIET:  cardiac diet and diabetic diet    ACTIVITY: activity as tolerated  Home health care for Skilled care for DM, Hypertension and medication management       PT/OT consult   Wound vac care consult      ADDITIONAL INFORMATION: If you experience any of the following symptoms but not limited to Fever, chills, nausea, vomiting, diarrhea, change in mentation, falling, bleeding, shortness of breath, chest pain, please call your primary care physician or return to the emergency room if you cannot get hold of your doctor:     FOLLOW UP CARE:   Follow-up Information     Ignacia Palma, PA. Schedule an appointment as soon as possible for a   visit in 1 week(s).    Specialty: Physician Assistant  Contact information:  [place] [address]  [phone]             Judith Part, MD. Schedule an appointment as soon as possible   for a visit in 2 week(s).    Specialty: Endocrinology  Contact information:  [place] [address]  [phone]                       Gordy Clement, MD  08/25/2022 12:00 PM

## 2022-08-26 ENCOUNTER — Encounter: Payer: PRIVATE HEALTH INSURANCE | Primary: Physician Assistant

## 2022-08-26 ENCOUNTER — Encounter: Primary: Physician Assistant

## 2022-08-26 LAB — ECHO (TTE) COMPLETE (PRN CONTRAST/BUBBLE/STRAIN/3D)
AV Area by Peak Velocity: 1.9 cm2
AV Area by VTI: 2 cm2
AV Mean Gradient: 4 mmHg
AV Mean Velocity: 1 m/s
AV Peak Gradient: 8 mmHg
AV Peak Velocity: 1.4 m/s
AV VTI: 31.3 cm
AV Velocity Ratio: 0.64
AVA/BSA Peak Velocity: 1 cm2/m2
AVA/BSA VTI: 1.1 cm2/m2
Ao Root Index: 1.21 cm/m2
Aortic Root: 2.3 cm
Body Surface Area: 1.93 m2
Fractional Shortening 2D: 27 % (ref 28–44)
IVSd: 1.4 cm — AB (ref 0.6–0.9)
LA Volume A-L A4C: 37 mL (ref 22–52)
LA Volume A-L A4C: 76 mL — AB (ref 22–52)
LA Volume A/L: 54 mL
LA Volume Index A-L A2C: 40 mL/m2 — AB (ref 16–34)
LA Volume Index A-L A4C: 19 mL/m2 (ref 16–34)
LA Volume Index A/L: 28 mL/m2 (ref 16–34)
LA Volume Index MOD A2C: 37 mL/m2 — AB (ref 16–34)
LA Volume Index MOD A4C: 18 mL/m2 (ref 16–34)
LA Volume MOD A2C: 71 mL — AB (ref 22–52)
LA Volume MOD A4C: 34 mL (ref 22–52)
LV E' Lateral Velocity: 11 cm/s
LV E' Septal Velocity: 6 cm/s
LV Mass 2D Index: 126.5 g/m2 — AB (ref 43–95)
LV Mass 2D: 240.3 g — AB (ref 67–162)
LV RWT Ratio: 0.64
LVIDd Index: 2.32 cm/m2
LVIDd: 4.4 cm (ref 3.9–5.3)
LVIDs Index: 1.68 cm/m2
LVIDs: 3.2 cm
LVOT Area: 3.1 cm2
LVOT Diameter: 2 cm
LVOT Mean Gradient: 2 mmHg
LVOT Peak Gradient: 3 mmHg
LVOT Peak Velocity: 0.9 m/s
LVOT SV: 66.6 mL
LVOT Stroke Volume Index: 35 mL/m2
LVOT VTI: 21.2 cm
LVOT:AV VTI Index: 0.68
LVPWd: 1.4 cm — AB (ref 0.6–0.9)
MV Area by VTI: 2.3 cm2
MV Max Velocity: 1.1 m/s
MV Mean Gradient: 2 mmHg
MV Mean Velocity: 0.7 m/s
MV Peak Gradient: 5 mmHg
MV VTI: 29 cm
MV:LVOT VTI Index: 1.37
PV Max Velocity: 0.9 m/s
PV Peak Gradient: 3 mmHg
RVOT Peak Gradient: 2 mmHg
RVOT Peak Velocity: 0.7 m/s

## 2022-08-26 LAB — CULTURE, URINE
Colony count: >100000
Culture: >2

## 2022-08-26 NOTE — Unmapped (Signed)
I certify that this patient is confined to his/her home and needs intermittent skilled nursing care, physical therapy and/or speech therapy or continues to need occupational therapy.  The patient is under my care, and I have authorized services on this plan of care and will periodically review the plan.  The patient had a face-to face encounter with an allowed provider type on 08/05/2022 and the encounter was related to the primary reason for home health care.

## 2022-08-26 NOTE — Unmapped (Signed)
Code Status: Full Code

## 2022-08-27 ENCOUNTER — Encounter: Admit: 2022-08-27 | Discharge: 2022-08-27 | Payer: PRIVATE HEALTH INSURANCE | Primary: Physician Assistant

## 2022-08-27 ENCOUNTER — Encounter: Payer: PRIVATE HEALTH INSURANCE | Primary: Physician Assistant

## 2022-08-27 NOTE — Home Health (Signed)
Skilled reason for visit: RESUME CARE FOR DIABETES AND STAGE 4 SACRAL WOUND.    Caregiver involvement: COOKS, CLEANS AND TAKES PATIENT TO MD APPT.    Medications reviewed and all medications are available in the home this visit.    The following education was provided regarding medications:  INSTRUCTED PATIENT ON IMPORTANCE OF TAKING MEDICATIONS AS ORDERED..    MD notified of any discrepancies/look a-like medications/medication interactions: NA  Medications are effective AND IN THE HOME AT THIS TIME.       Home health supplies by type and quantity ordered/delivered this visit include: ORDERED WD VAC SUPPLIES.    Patient education provided this visit: TAUGHT PATIENT ON S/S OF INFECTION AT WOUND SITE, INSTRUCTED PATIENT ON  KEEPING WOUND DRY. INSTRUCTED PATIENT ON KEEPING BLOOD SUGARS LOW TO PROMOTE HEALING. INSTRUCTED PATIENT TO TURN FROM SIDE TO SIDE TO RELIEVE PRESSURE ON SACRAL AREA.    [place] education provided: YES    Patient level of understanding of education provided:GOOD.    Patient response to procedure performed:  GOOD.      Patient's Progress towards personal goals:PROGRESSING.    Home exercise program: DEEP BREATHING EXERCISES TO HELP PREVENT PNEUMONIA.    Continued need for the following skills: Nursing.    Plan for next visit: APPLY WD VAC, TEACHING ON DISEASE PROCESS FOR DIABETES.    Patient and/or caregiver notified and agrees to changes in the Plan of Care: Yes.     The following discharge planning was discussed with the pt/caregiver: PATIENT WLL BE DISCHARGED WHEN GOALS ARE MET AND PATIENT IS STABLE.

## 2022-08-28 ENCOUNTER — Encounter: Primary: Physician Assistant

## 2022-08-28 ENCOUNTER — Encounter: Admit: 2022-08-28 | Discharge: 2022-08-28 | Payer: PRIVATE HEALTH INSURANCE | Primary: Physician Assistant

## 2022-08-28 ENCOUNTER — Encounter: Payer: PRIVATE HEALTH INSURANCE | Primary: Physician Assistant

## 2022-08-28 NOTE — Home Health (Signed)
PMHx: H+P per hospitaliztion 08/23/22 [name] Short is a 67 y.o. female with a PMHx of arthritis, DMT2, hypertension, hyperlipidemia, sleep apnea noncompliant with CPAP, stroke with right sided weakness who presented to the ED after being found unresponsive for a brief period and then altered by caregiver today. EMS was called and patient BS was 32 upon their arrival. Patient was treated appropriately for hypoglycemia. Patient is noted to have a sacral wound vac in place since ay 2023. Denies being on abx at home. Daughter at bedside who assisted in providing history. Daughter feeding patient at bedside and patient tolerating food without coughing or choking. Currently upon my evaluation, patient resting in no distress, eating dinner. VSS. Receiving IV mag sulfate. Per daughter patient last took her 30 U Lantus at 12:30 AM last night, did not have a bedtime snack. This morning patient had breast at 7 :30 AM and had her SSI. Initially in the morning her BS was 119, but subsequently she came diaphoretic, altered, unresponsive. Patient also has RLE weakness at baseline and uses a walker at home to walk. Today, she felt increased weakness to RLE.     In the ED, temp 94.6-->97.8, Resp 10-13, HR 50-70, BP 179/81, 100% on RA. Mag 1.2, Lactic acid 2.4--> 0.8. Procal < 0.05. Trop x2 negative. Glucose 49-->127. Chronically elevated liver enzymes. CBC without abnormality. Blood cultures x 2 obtained. Urine culture obtained. CXR negative. EKG with sinus bradycardia. UA with positive nitrite, no WBC. CT head with subacute infarct. CTA head and neck preliminary read with no LVO. Neurology has been consulted by the ED.   Received ASA 325, Ceftriaxone, Plavix, D10, Glucagon, Hydralazine, Mag Sulfate, 2 L NS bolus.     Patient was admitted to hospital with impression of SIRS, rule out sepsis, rule out CVA.  MRI was ordered, neurology was consulted.  Patient was started on empiric antibiotics, IV fluids.  Lantus was held.  Patient's  mental status improved, blood sugar remained stable.  Neurology saw the patient and agree with MRI.  MRI came back negative.  Neurology thought patient's symptoms most likely from hypoglycemia did not suspect seizures or CVA.  Neuro recommended no further intervention.  Infection workup negative.  UA was negative, urine culture growing gram-negative rods but patient asymptomatic and no pyuria suspect colonization.  Sacral wound no signs of infection.  Blood sugars trended up now, will start low-dose of Lantus.  Blood pressure trending up, will resume Coreg and also add hydralazine.  Patient currently does not have any signs of infection, she is hemodynamically medically stable for discharge.  Patient will be discharged home with outpatient follow-up.    Discussed with patient and also with her daughter over the phone about her discharge plan, follow-up appointments, new medications and side effects.  Discussed with the patient and daughter about starting Lantus at 10 units and slowly increase by 2 units if blood sugars more than 200.  Also advised her to decrease lispro to 3 units.  Patient and daughter complaint understood and agreed with the plan.  Answered all their questions and concerns appropriate.       SUBJECTIVE:Pt daughter reported that pt slept sitting on the edges of the couch last night. I reniforced the importances of laying down on the couch to elevate B LE to assist with swellling   LIVING SITUATION: Pt lives with daughter in a very cluttered single level home with 4 steps to enter with 2 wide HR . Pt noted that her sugar was 221.  Pt noted that she came home from the hospital in her daughter car and had a hard time getting out of it. Pt also noted that she was able to come up the steps with alot of help from her daughter   REQUIRES CAREGIVER ASSISTANCE FOR: transportation, medications, ADSL, IADLS   PLOF: Pt need A with with RW. Pt needs A with dressing and bathing   MEDICATIONS REVIEWED AND  RECONCILED: no changes   NEXT MD APPT: Dr.Fetters TBD   EQUIPMENT:RW woud vac,   ROM:B LE's WNL  STRENGTH: R hip flexors 3/5 R quads 3/5, R hams 3+/5, L LE hip flexors, quads and hamstrings  3+/5  WOUNDS:Wound vac in place and running to pateint sacral area, No signs of leakage noted. +2 pitting edema noted B ankles. Reinforced elevating B LE throughout the day to A with swelling   BED MOBILITY:sleeps on couch, unable to get to bedroom due to extensive clutter sit to supine with MIN A for B LE managment. supine to sit wtih SBA   TRANSFERS sit to stand from low couch with B UE support for push off with CGA/ MIN A Assistance needs to make space so the walker could be placed infont of pt and to remove clutter on the ground   GAIT:t amb 20 ftx 2 to kitchen and back  with RW with step too gait pattern with SBA. Decreased B step length noted . VC needed to keep the walker closer as she pushs it to far infront of her. Fwd flexed posture noted. B feet did clear the floor during swing phase of gait. slow cadence. Increased time needed to complete task. Therapist A with wound vac tubing. Pt is limited in distances with amb secondary to increased clutter. Recommeded that pathways pt amb in be free of clutter especially pathway to the front door  STAIRS:NA  BALANCE: Pt scored 12/28 on [place]etti Balance Assessment placing pt at high  risk for falls.   PATIENT RESPONE TO TX: Pt pain level remained the same througout tx session   PATIENT LEVEL OF UNDERSTANDING OF EDUCATION PROVIDED Pt educated to use RW at all times when amb.  PATIENT EDUCATION PROVIDED THIS VISIT: safety, HEP, walking, deep breathing,  Pt educated to change position every HR for pressure relief . Educated pt and daughter to keep the pathways that she walkis in free clean of clutter for safety.     HEP consisting of:  1. Walking every hour during the day with RW with A  2. B LE seated hip flexion, LAQS, AP 3 daily x10  Written HEP issued, patient/caregiver  verbalized understanding.   CONTINUED NEED FOR THE FOLLOWING SKILLS: HH PT is medically necessary to  address pain, decreased strength, increased swelling, impaired bed mobility, decreased independence with functional transfers, impaired gait, impaired stair negotiation, and impaired balance in order to improve functional independence, quality of life, return to PLOF, and reduce the risk for falls.  PLAN: Pt will be seen to establish and upgrade HEP. Gait training with RW on flat and uneven surfaces. Bed mobility training, transfer training. Stair training. Dynamic standing balance re -education. Reinforce general safety precautions.  DISCHARGE PLANNING DISCUSSED: Discharge to self and family under MD supervision once all goals have been met or patient has reached max potential. Patient/caregiver verbalized understanding.

## 2022-08-29 ENCOUNTER — Encounter: Payer: PRIVATE HEALTH INSURANCE | Primary: Physician Assistant

## 2022-08-29 ENCOUNTER — Encounter: Admit: 2022-08-29 | Discharge: 2022-08-29 | Payer: PRIVATE HEALTH INSURANCE | Primary: Physician Assistant

## 2022-08-29 LAB — CULTURE, BLOOD 1: Culture: NO GROWTH

## 2022-08-29 LAB — CULTURE, BLOOD 2: Culture: NO GROWTH

## 2022-08-29 NOTE — Home Health (Signed)
Skilled reason for visit: Pt assessment and wound care    Caregiver involvement: Pt resides in a single story home with her daughter, pt amb with use of a walker and requires asst with all ADL's and medications, a PCA comes in during the week intermittently when the agency has someone available and the daughter is the primary CG and assts with transportation to appts and is available 24/7 to asst as needed.    This visit: Pt is A&Ox4 with some periods of forgetfulness, able to communicate her needs, no verbal c/o pain or discomfort at this time, pts sacral wound remains stable and continues to progress well AEB a reduction in size and drainage, tx in progress continues, pt continues to tolerate well.    Home health supplies by type and quantity ordered/delivered this visit include: N/A    Medications reviewed and all medications are available in the home this visit.    MD notified of any discrepancies/look a-like medications/medication interactions: NA    Medications are effective at this time.     Patient education provided this visit: SEE INTERVENTIONS LIST    Pt advised not to start, stop or change the way she is currently taking any of her medications without first consulting with the MD.    SN instructed pt/CG to promptly report any redness, swelling, warmth, fever, chills, increased heart/respiratory rate, uncontrolled pain/tenderness, drainage: green/yellow/brown, or odor to MD immediately. No s/s of infection noted this visit no odor or foul drainage noted.      SN instructed pt/CG to notify home health or physician for the following wound healing complications: increased drainage, pus or a foul odor coming from the wound, fever, muscle, joint, or body aches, sweating, increased swelling, redness or red streaks surrounding the wound; dramatic change in color or size of wound; increase in pain at wound site in spite of interventions.    SN advised CG during shower/bath time to assess pts skin for evidence of  pressure injury such as: non-blanchable erythema, blistering, discoloration, alteration of oral mucosa: signs of mucositis, yeast infection, or breaks in membrane, edema, rash and skin tears. Report location, size, and severity of new or worsening findings to physician.    Agency Progress toward goals: progressing well    Patient's Progress towards personal goals: progressing well    Pt acknowledged understanding of all education provided.    Patient's Progress towards personal goals: when patient reaches goals and medication is managed, disease processes are understood patient agrees and understand that discharge will take place.

## 2022-08-29 NOTE — Telephone Encounter (Addendum)
Spoke with [employer] regarding pt home health wound vac supplies order, per [employer] her order is pending insurance approval and our office does not need to do anything at this time.     Spoke with Malachy Mood at [facility] she is sending message to Dwana Curd RN, I attempted to reach Ivin Booty at number left with our front desk [phone] and it says not in order, Malachy Mood aware I spoke with [employer] and insurance is pending.

## 2022-09-01 ENCOUNTER — Encounter: Payer: PRIVATE HEALTH INSURANCE | Primary: Physician Assistant

## 2022-09-01 ENCOUNTER — Encounter: Admit: 2022-09-01 | Discharge: 2022-09-01 | Payer: PRIVATE HEALTH INSURANCE | Primary: Physician Assistant

## 2022-09-01 NOTE — Home Health (Signed)
Skilled reason for visit: Pt assessment and wound care    Caregiver involvement: Pt resides in a single story home with her daughter, pt amb with use of a walker and requires asst with all ADL's and medications, a PCA comes in during the week intermittently when the agency has someone available and the daughter is the primary CG and assts with transportation to appts and is available 24/7 to asst as needed.    This visit: Pt is A&Ox4 with some periods of forgetfulness, able to communicate her needs, no  c/o pain or discomfort voiced, pts sacral wound remains stable, tx progress continues, pt continues to tolerate well.    Home health supplies by type and quantity ordered/delivered this visit include: N/A    Medications reviewed and all medications are available in the home this visit.    MD notified of any discrepancies/look a-like medications/medication interactions: NA    Medications are effective at this time.     Patient education provided this visit: SEE INTERVENTIONS LIST    Pt advised not to start, stop or change the way she is currently taking any of her medications without first consulting with the MD.    SN instructed pt/CG to promptly report any redness, swelling, warmth, fever, chills, increased heart/respiratory rate, uncontrolled pain/tenderness, drainage: green/yellow/brown, or odor to MD immediately. No s/s of infection noted this visit no odor or foul drainage noted.      SN instructed pt/CG to notify home health or physician for the following wound healing complications: increased drainage, pus or a foul odor coming from the wound, fever, muscle, joint, or body aches, sweating, increased swelling, redness or red streaks surrounding the wound; dramatic change in color or size of wound; increase in pain at wound site in spite of interventions.    SN advised CG during shower/bath time to assess pts skin for evidence of pressure injury such as: non-blanchable erythema, blistering, discoloration,  alteration of oral mucosa: signs of mucositis, yeast infection, or breaks in membrane, edema, rash and skin tears. Report location, size, and severity of new or worsening findings to physician.    Agency Progress toward goals: progressing well    Patient's Progress towards personal goals: progressing well    Pt acknowledged understanding of all education provided.    Patient's Progress towards personal goals: when patient reaches goals and medication is managed, disease processes are understood patient agrees and understand that discharge will take place.

## 2022-09-01 NOTE — Progress Notes (Signed)
Internal Medicine Clinic Attending  Case discussed with the resident at the time of the visit.  We reviewed the resident's history and exam and pertinent patient test results.  I agree with the assessment, diagnosis, and plan of care documented in the resident's note.  

## 2022-09-02 ENCOUNTER — Encounter: Admit: 2022-09-02 | Discharge: 2022-09-02 | Payer: PRIVATE HEALTH INSURANCE | Primary: Physician Assistant

## 2022-09-02 ENCOUNTER — Encounter: Payer: PRIVATE HEALTH INSURANCE | Primary: Physician Assistant

## 2022-09-02 NOTE — Home Health (Addendum)
SUBJECTIVE: Pt reports she is feeling stronger and can do a little more today.  CAREGIVER INVOLVEMENT/ASSISTANCE NEEDED FOR: Pts dtr present during PT session and assist pt with all needs prn. Pt also has paid caregiver during the day.  .  OBJECTIVE: See interventions.  PATIENT EDUCATION PROVIDED THIS VISIT: Pt instructed to continue HEP 2x/day and amb with FWW in home every 2-3 hrs with assist.  PATIENT RESPONSE TO EDUCATION PROVIDED: Pt reports she walks a few times/day with FWW in home and does her HEP daily.  PATIENT RESPONSE TO TREATMENT: Pt requires frequent rest breaks throughout PT session secondary to c/o fatigue.  Vitals remain WFL.  Marland Kitchen  ASSESSMENT OF PROGRESS TOWARD GOALS: Pt required continued cuing to safely manuver around obtacles in home.  Pt has increased distance x 30 ft today.  Pt reports a good understanding of sitting exercises and requires cuing to pace herself with activities to conserve energy.  Marland Kitchen  PLAN FOR NEXT VISIT: Will plan to attempt amb to/from front door next visit in prep for enter/exiting home.  THE FOLLOWING DISCHARGE PLANNING WAS DISCUSSED WITH THE PATIENT/CAREGIVER: Pt is scheduled to continue PT 1 more visit this week then 2w3, 1w1.

## 2022-09-03 ENCOUNTER — Encounter: Payer: PRIVATE HEALTH INSURANCE | Primary: Physician Assistant

## 2022-09-04 ENCOUNTER — Encounter: Payer: PRIVATE HEALTH INSURANCE | Primary: Physician Assistant

## 2022-09-04 ENCOUNTER — Encounter: Admit: 2022-09-04 | Discharge: 2022-09-04 | Payer: PRIVATE HEALTH INSURANCE | Primary: Physician Assistant

## 2022-09-04 NOTE — Home Health (Signed)
Skilled reason for visit: Pt assessment and wound care    Caregiver involvement: Pt resides in a single story home with her daughter, pt amb with use of a walker and requires asst with all ADL's and medications, a PCA comes in during the week intermittently when the agency has someone available and the daughter is the primary CG and assts with transportation to appts and is available 24/7 to asst as needed.    This visit: Pt is A&Ox4 with some periods of forgetfulness, able to communicate her needs, no c/o pain or discomfort voiced, SN provided tx to pts sacral wound, wound remains stable, pt tolerated well, tx in progress continues.     Home health supplies by type and quantity ordered/delivered this visit include: N/A    Medications reviewed and all medications are available in the home this visit.    MD notified of any discrepancies/look a-like medications/medication interactions: NA    Medications are effective at this time.     Patient education provided this visit: SEE INTERVENTIONS LIST    Pt advised not to start, stop or change the way she is currently taking any of her medications without first consulting with the MD.    SN instructed pt/CG to promptly report any redness, swelling, warmth, fever, chills, increased heart/respiratory rate, uncontrolled pain/tenderness, drainage: green/yellow/brown, or odor to MD immediately. No s/s of infection noted this visit no odor or foul drainage noted.      SN instructed pt/CG to notify home health or physician for the following wound healing complications: increased drainage, pus or a foul odor coming from the wound, fever, muscle, joint, or body aches, sweating, increased swelling, redness or red streaks surrounding the wound; dramatic change in color or size of wound; increase in pain at wound site in spite of interventions.    SN advised CG during shower/bath time to assess pts skin for evidence of pressure injury such as: non-blanchable erythema, blistering,  discoloration, alteration of oral mucosa: signs of mucositis, yeast infection, or breaks in membrane, edema, rash and skin tears. Report location, size, and severity of new or worsening findings to physician.    Agency Progress toward goals: progressing well    Patient's Progress towards personal goals: progressing well    Pt acknowledged understanding of all education provided.    Patient's Progress towards personal goals: when patient reaches goals and medication is managed, disease processes are understood patient agrees and understand that discharge will take place.

## 2022-09-05 ENCOUNTER — Encounter: Payer: PRIVATE HEALTH INSURANCE | Primary: Physician Assistant

## 2022-09-05 ENCOUNTER — Encounter: Admit: 2022-09-05 | Discharge: 2022-09-05 | Payer: PRIVATE HEALTH INSURANCE | Primary: Physician Assistant

## 2022-09-05 NOTE — Home Health (Signed)
SUBJECTIVE: Pt reports she is tired from MD appt today but will do exercises after amb from car into home.  CAREGIVER INVOLVEMENT/ASSISTANCE NEEDED FOR: pts dtr and paid caregiver present during PT session and assist pt with all needs prn.  .  OBJECTIVE: See interventions.  PATIENT EDUCATION PROVIDED THIS VISIT: Pt instructed to continue HEP 2x/day to BLEs in sitting/standing with walker and amb every 2 waking hours in home.  PATIENT RESPONSE TO EDUCATION PROVIDED: Pt reports she has been walking more during the day and does exercises daily.  PATIENT RESPONSE TO TREATMENT: Pt requires frequent rest breaks throughout PT session secondary to c/o fatigue.  .  ASSESSMENT OF PROGRESS TOWARD GOALS: Pt returning home from MD appt when LPTA arrived.  Pt requires extra time to transfer out of car with walker for immediate stance and cuing for safe positioning transferring hands from walker to hand rail to ascend stairs.  Pt requires min A and assist to manage walker.  Pt also requires occ cuing to safely manage walker through narrow pathway in living room.  Recommend clearing pathway and increasing light in living room for safety.  Marland Kitchen  PLAN FOR NEXT VISIT: Will plan to continue working towards improving LE strength and gait ability.  THE FOLLOWING DISCHARGE PLANNING WAS DISCUSSED WITH THE PATIENT/CAREGIVER:Pt is scheduled to continue PT 2w3.

## 2022-09-05 NOTE — Unmapped (Signed)
Recertification Statement                           1. I certify that the patient needs intermittent care as follows: skilled nursing care:  skilled observation/assessment, patient education, therapeutic drug monitoring, and wound care    2. I certify that this patient is homebound, that is: 1) patient requires the use of a wheelchair device, special transportation, or assistance of another to leave the home; or 2) patient's condition makes leaving the home medically contraindicated; and 3) patient has a normal inability to leave the home and leaving the home requires considerable and taxing effort.  Patient may leave the home for infrequent and short duration for medical reasons, and occasional absences for non-medical reasons. Homebound status is due to the following functional limitations: Patient with strength deficits limiting the performance of all ADL's without caregiver assistance or the use of an assistive device.    3. I certify that this patient is confined to his/her home and needs intermittent skilled nursing care, physical therapy and/or speech therapy or continues to need occupational therapy.  The patient is under my care, and I have authorized services on this plan of care and will periodically review the plan.

## 2022-09-06 ENCOUNTER — Encounter: Admit: 2022-09-06 | Discharge: 2022-09-06 | Payer: PRIVATE HEALTH INSURANCE | Primary: Physician Assistant

## 2022-09-06 NOTE — Home Health (Signed)
Skilled reason for visit: Assessment, sacral wound care; wound vac    Medications reviewed and all listed medications are available at home.  The following education was provided regarding medications, medication interactions, and look a like medications: all med's reviewed. Discussed importance of timely taking all med's, proper dosage and freq. Medications are somewhat effective at this time. No new medication added.  Caregiver: CG/daughter, PCA  who assist with daily meals, assist with ADL's, assist/administer with daily medication, run errands groceries and accompany to MD appt.prn     Patient education provided this visit to include: Reviewed infection prevention; hand washing or using hand sanitizer when touching contaminated items. Repositioning q 2 hrs, keeping peri-area dry and clean. Continue to monitor wound vac malfunction, clogged and air leak and to call [facility] for assistance prn. Encouraged to ambulate as tolerated. Increasing protein intake may consume Glucerna 1-2 x/day, avoid skipping meals and good hydration to prevent constipation. Monitor daily BG before meals. Continue to monitor for S/S of infection; fever 100.4, increase pain, redness, increased swelling, coughing with yellow thick sputum, cloudy urine with strong odor, purulent wound drainage with foul smell, not feeling well 2-3 days, SOB, and to call [facility] or MD for assistance if experiencing any of these S/S. To call 911 with chest pains, facial drooping, difficulty talking, non arousable/unconscious and uncontrollable bleeding.    Sharps education provided: Completed teaching.  Patient/caregiver degree of understanding: Pt/CG has good understanding of the teaching provided during visit.    Skilled care provided: Completed assessment, performed sacral surgical wound care (see wound addendum). Wound is healing well and no s/s of infection. Pt. tolerated well during the assessment & procedure with no C/O.     Continue skilled care to provide:  SN, PT    Home health supplies by type and quantity ordered/delivered this visit include: N/A (has supplies at home)    Agency Progress toward goals: Progressing towards goals  Patient's Progress towards personal goals:  Partially met. Pt adheres POC. Pt/CG has good understanding purpose with daily medications and current wound treatment.   Home exercise program/Homework provided: Ambulation, HEP deep breathing exercises 10x when having SOB, pain and anxiety.     Discharge planning discussed with patient and caregiver. Discharge planning as follows: Pt/CG will continue  to manage disease and medication independently, pt reach her maximum level of function, sacral wound is healed and health condition stable. Pt/Caregiver did verbalize understanding of discharge planning.

## 2022-09-08 ENCOUNTER — Encounter: Payer: PRIVATE HEALTH INSURANCE | Primary: Physician Assistant

## 2022-09-08 ENCOUNTER — Encounter: Admit: 2022-09-08 | Discharge: 2022-09-08 | Payer: PRIVATE HEALTH INSURANCE | Primary: Physician Assistant

## 2022-09-08 NOTE — Home Health (Signed)
Skilled reason for visit: Pt assessment and wound care    Caregiver involvement: Pt resides in a single story home with her daughter, pt amb with use of a walker and requires asst with all ADL's and medications, a PCA comes in during the week intermittently when the agency has someone available and the daughter is the primary CG and assts with transportation to appts and is available 24/7 to asst as needed.    This visit: Pt is A&Ox4, able to communicate her needs, no c/o pain or discomfort voiced, pts sacral wound continues to heal nicely with a noted decrease in size and drainage amt, no odor or s/s of infxn, tx in progress of wound vac with continuous suction @ 172mmHg and dsg changes 3x/wk continues and pt continues to tolerate well.    Home health supplies by type and quantity ordered/delivered this visit include: N/A    Medications reviewed and all medications are available in the home this visit.    MD notified of any discrepancies/look a-like medications/medication interactions: NA    Medications are effective at this time.     Patient education provided this visit: SEE INTERVENTIONS LIST    Pt advised not to start, stop or change the way she is currently taking any of her medications without first consulting with the MD.    SN instructed pt/CG to promptly report any redness, swelling, warmth, fever, chills, increased heart/respiratory rate, uncontrolled pain/tenderness, drainage: green/yellow/brown, or odor to MD immediately. No s/s of infection noted this visit no odor or foul drainage noted.      SN instructed pt/CG to notify home health or physician for the following wound healing complications: increased drainage, pus or a foul odor coming from the wound, fever, muscle, joint, or body aches, sweating, increased swelling, redness or red streaks surrounding the wound; dramatic change in color or size of wound; increase in pain at wound site in spite of interventions.    SN advised CG during shower/bath  time to assess pts skin for evidence of pressure injury such as: non-blanchable erythema, blistering, discoloration, alteration of oral mucosa: signs of mucositis, yeast infection, or breaks in membrane, edema, rash and skin tears. Report location, size, and severity of new or worsening findings to physician.    Agency Progress toward goals: progressing well    Patient's Progress towards personal goals: progressing well    Pt acknowledged understanding of all education provided.    Patient's Progress towards personal goals: when patient reaches goals and medication is managed, disease processes are understood patient agrees and understand that discharge will take place.

## 2022-09-09 ENCOUNTER — Encounter: Payer: PRIVATE HEALTH INSURANCE | Primary: Physician Assistant

## 2022-09-09 ENCOUNTER — Encounter: Admit: 2022-09-09 | Discharge: 2022-09-09 | Payer: PRIVATE HEALTH INSURANCE | Primary: Physician Assistant

## 2022-09-09 NOTE — Home Health (Signed)
SUBJECTIVE: Pt reports she is sleepy today but will do what she can do.  CAREGIVER INVOLVEMENT/ASSISTANCE NEEDED FOR: Pts continues to require frequent rest breaks throughout PT session secondary to c/o fatigue.  .  OBJECTIVE: See interventions.  PATIENT EDUCATION PROVIDED THIS VISIT: Pt instructed to continue HEP 2x/day and amb with FWW every 2-3 waking hours.  Pt to also pace herself with activities and keep walker closer to her during amb.  PATIENT RESPONSE TO EDUCATION PROVIDED: Pt reports she has been walking more during the day and doing exercises daily.  PATIENT RESPONSE TO TREATMENT: Pt requires frequent rest breaks throughout PT session secondary to c/o fatigue.  .  ASSESSMENT OF PROGRESS TOWARD GOALS: Pt continues to require safely cues to stay closer to walker and increase upright posture during amb for safety.  Pt has increased amb distance today.  Marland Kitchen  PLAN FOR NEXT VISIT: Will plan to progress to amb to front door next visit to [facility] through narrow passageways.  THE FOLLOWING DISCHARGE PLANNING WAS DISCUSSED WITH THE PATIENT/CAREGIVER: Pt is scheduled to continue PT 1 more visit this week then 2w2

## 2022-09-10 ENCOUNTER — Encounter: Admit: 2022-09-10 | Discharge: 2022-09-10 | Payer: PRIVATE HEALTH INSURANCE | Primary: Physician Assistant

## 2022-09-10 ENCOUNTER — Encounter: Payer: PRIVATE HEALTH INSURANCE | Primary: Physician Assistant

## 2022-09-10 NOTE — Home Health (Signed)
Skilled reason for visit: Wound care    Caregiver involvement: Pt resides in a single story home with her daughter, pt amb with use of a walker and requires asst with all ADL's and medications, a PCA comes in during the week intermittently when the agency has someone available and the daughter is the primary CG and provides transportation to appts and is available to asst as needed.    This visit: Pt is A&Ox4, able to communicate her needs, no c/o pain or discomfort voiced, pts sacral wound remains stable, will continue to monitor the decrease in drainage and consider d/c the wound vac if scantamt of drainage continues, canister has not been changed this week, SN instructed CG to monitor..    Home health supplies by type and quantity ordered/delivered this visit include: N/A    Medications reviewed and all medications are available in the home this visit.    MD notified of any discrepancies/look a-like medications/medication interactions: NA    Medications are effective at this time.     Patient education provided this visit: SEE INTERVENTIONS LIST    Pt advised not to start, stop or change the way she is currently taking any of her medications without first consulting with the MD.    SN instructed pt/CG to promptly report any redness, swelling, warmth, fever, chills, increased heart/respiratory rate, uncontrolled pain/tenderness, drainage: green/yellow/brown, or odor to MD immediately. No s/s of infection noted this visit no odor or foul drainage noted.      SN instructed pt/CG to notify home health or physician for the following wound healing complications: increased drainage, pus or a foul odor coming from the wound, fever, muscle, joint, or body aches, sweating, increased swelling, redness or red streaks surrounding the wound; dramatic change in color or size of wound; increase in pain at wound site in spite of interventions.    SN advised CG during shower/bath time to assess pts skin for evidence of pressure  injury such as: non-blanchable erythema, blistering, discoloration, alteration of oral mucosa: signs of mucositis, yeast infection, or breaks in membrane, edema, rash and skin tears. Report location, size, and severity of new or worsening findings to physician.    Agency Progress toward goals: progressing well    Patient's Progress towards personal goals: progressing well    Pt acknowledged understanding of all education provided.    Patient's Progress towards personal goals: when patient reaches goals and medication is managed, disease processes are understood patient agrees and understand that discharge will take place.

## 2022-09-11 ENCOUNTER — Encounter: Admit: 2022-09-11 | Discharge: 2022-09-11 | Payer: PRIVATE HEALTH INSURANCE | Primary: Physician Assistant

## 2022-09-11 NOTE — Home Health (Signed)
SUBJECTIVE: Pt reports she just woke up and is very tired today.  CAREGIVER INVOLVEMENT/ASSISTANCE NEEDED FOR: pts dtr assist pt with all needs and present during PT session.  .  OBJECTIVE: See interventions.  PATIENT EDUCATION PROVIDED THIS VISIT: Pt instructed to continue HEP 2x/day and amb with FWW 4-5x/day with assist.  Reviewed with pt importance of proper walker placement and increasing upright posture for safety.  PATIENT RESPONSE TO EDUCATION PROVIDED: Pt reports she does her HEP 1-2x/day and amb with FWW a few times a day.  Pt reports she transfers to/from [facility] frequently throughout the day.  PATIENT RESPONSE TO TREATMENT:Pt required much encouragement initially to participate with PT secondary to c/o fatigue.  Pt requires extra time/effort with all exercises.  .  ASSESSMENT OF PROGRESS TOWARD GOALS: Pt has a decreased tolerance for PT overall today secondary to c/o fatigue.  Pt was able to perform standing exercise today but declined 2nd amb today secondary to c/o fatigue.  Marland Kitchen  PLAN FOR NEXT VISIT: Will plan to attempt amb to/from porch next visit.  THE FOLLOWING DISCHARGE PLANNING WAS DISCUSSED WITH THE PATIENT/CAREGIVER: Pt is scheduled to continue PT 2w2.

## 2022-09-12 ENCOUNTER — Encounter: Payer: PRIVATE HEALTH INSURANCE | Primary: Physician Assistant

## 2022-09-12 ENCOUNTER — Encounter: Admit: 2022-09-12 | Discharge: 2022-09-12 | Payer: PRIVATE HEALTH INSURANCE | Primary: Physician Assistant

## 2022-09-13 ENCOUNTER — Encounter: Payer: PRIVATE HEALTH INSURANCE | Primary: Physician Assistant

## 2022-09-13 ENCOUNTER — Encounter: Admit: 2022-09-13 | Discharge: 2022-09-13 | Payer: PRIVATE HEALTH INSURANCE | Primary: Physician Assistant

## 2022-09-13 NOTE — Home Health (Signed)
Skilled reason for visit: 67 year old female who is requiring wound care to sacral area.  Patient had wound vac on and stated that it has been beeping for a long time.  Assessed wound, and recommending discontinuing wound vac and starting Collagen with Aquacel Ag/equivalent treatment to area as it is superficial and healing well.  Caregiver involvement: Daughter, able to assist with any needs that may arise-ADL's, medications, meal preparation, wound care, transportation to MD appointments.  Medications reviewed and all medications are available in the home this visit.    The following education was provided regarding medications:  side effects,dosages, purposes, frequencies (patient knowledgeable regarding medications).    MD notified of any discrepancies/look a-like medications/medication interactions: No issues found  Medications are effective at this time.    Home health supplies by type and quantity ordered/delivered this visit include: Adequate supplies  Patient education provided this visit:   INSTRUCTED PATIENT AND CG THAT SHOULD ANY NEEDS OR CONCERNS ARISE TO FIRST CALL OUR OFFICE, OR THE DR'S OFFICE  OR GO TO AN URGENT CARE CENTER AND NOT TO THE ED FOR NON-LIFE THREATENING EVENTS. IF IT IS LIFE THREATENING THEN CALL 911 OR GO TO THE CLOSEST ER.  Patient is a fall risk. Educated pateint to sit on the side of the chair/bed, take a slow deep breaths, have feet firmly planted before standing up, use cane/walker if available, or have someone to assist.  encouraged patient to get three nutritional meals daily and to stay hydrated, drink plenty of fluids.  patient made aware to monitor for s/s of infection [increased swelling, increased redness around site, increased pain, foul smelling drainage, fever] aware who to report to/when.  pt aware to keep dressing clean, dry and intact as ordered.  Pt/cg aware of disease process, s/s to monitor for, who to report to/when.    Patient level of understanding of education  provided: Good, verbalized understanding.    Skilled Care Performed this visit: Dressing change, assessment    Patient response to procedure performed:  Tolerated well, no complaints of pain or discomfort.    Agency Progress toward goals: Progressing    Patient's Progress towards personal goals: Progressing    Home exercise program: Take medications as prescribed, monitor for signs of infection, keep dressing clean and dry, keep all MD appointments.    Continued need for the following skills: Nursing    Plan for next visit: Wound care, assessment    Patient and/or caregiver notified and agrees to changes in the Plan of Care YES/NO/NA: YES      The following discharge planning was discussed with the pt/caregiver: Patient will be discharged once education has completed, patient is medically stable and pt/cg are able to independently manage wound care/ wound has healed or no longer requires skilled care.

## 2022-09-15 ENCOUNTER — Encounter: Admit: 2022-09-15 | Discharge: 2022-09-15 | Payer: PRIVATE HEALTH INSURANCE | Primary: Physician Assistant

## 2022-09-15 ENCOUNTER — Encounter: Payer: PRIVATE HEALTH INSURANCE | Primary: Physician Assistant

## 2022-09-15 NOTE — Home Health (Signed)
Caregiver involvement: Pt's daughter lives with her and assists with ADL and functional mobility.    Medications reviewed and all medications are available in the home this visit.    The following education was provided regarding medications, medication interactions, and look alike medications (specify): Continue as directed by MD.    Medications  are effective at this time.      Patient education provided this visit: see ADL note    Sharps education provided:  na    Patient level of understanding of education provided: see ADL note    Skilled Care Performed this visit: Completed OT evaluation and assessment for safety with ADL and mobility.    Modified Barthel ADL Index  Bowels   ( ) 0 = Incontinent or needs enemas   (x ) 1 = Occasional Accident   ( ) 2 = Continent  Bladder    ( x) 0 = Incontinent   ( ) 1 = Occasional accident (1x/wk)   ( ) 2 = Continent  Grooming    (x ) 0 = Needs help with personal care   ( ) 1 = Independent (including face, hair, teeth, shaving)  Toilet Use    (x ) 0 = Dependent   ( ) 1 = Needs some help   ( ) 2 = Independent  Feeding   ( ) 0 = Unable   ( ) 1 = Needs help, e.g. cutting   (x ) 2 = Independent  Transfers   (bed to chair and back)   ( ) 0 = Unable, no sitting balance   (x ) 1 = Major help (1 or 2 people), can sit   ( ) 2 = Minor help (verbal or physical)   ( ) 3 = Independent  Mobility   (x ) 0 = Immobile   ( ) 1 = Wheelchair independent (including corners)   ( ) 2 = Walks with the help of 1 person (physical or verbal help)   ( ) 3 = Independent (may use aid)  Dressing    ( ) 0 = Unable   (x ) 1 = Needs help; can do ~1/2 unaided   ( ) 2 = Independent  Stairs   (x ) 0 = Unable   ( ) 1 =  Needs help (verbal or physical)   ( ) 2 = Independent  Bathing   ( x) 0 = Dependent   ( ) 1 = Independent    Total: (5)  Scores <15 usually represent moderate disability  Scores <10 usually represent severe disability    Patient response to procedure performed:  [name] Short had a positive response to therapy.  She was agreeable to participate with assessment and comply with recommendation.  Vitals were within therapy parameters.    Patient's Progress towards personal goals: [name] Short has good rehab potential to return to S with all ADL and transfers with support from her daughter and ongoing OT for training.  She currently requires min A for UB ADL and max for LB ADL due to safety limitations with balance and fatigue.    Home exercise program: B UE strengthening against gravity for shoulder flexion/extension, overhead press, chest press, ab/adduction and elbow flexion/extension    Continued need for the following skills: OT, PT and SN    Discharge Plans:  1w4 with plans to d/c to self.  Pt requesting 1 OT visit per week at this time.  MD office notified of OT freqeuency.  CONTINUED NEED FOR THE FOLLOWING SKILLS: HH OT is medically necessary to address barriers of decreased activity tolerance, generalized weakness and decreased standing balance.  These barriers limit pt's participation in ADL and functional mobility safely and would benefit from continued skilled OT to maximize independence and reduce burden on caregiver.    Notes per [facility]    Referral Instructions         Re-admitted to [facility]: 08/23/22 - 08/25/22 (hjl)  DX: DM2  Disciplines: SN / PT / OT  Services: Resume skilled care for hypertension, disease, wound care and wound VAC management. PT/OT eval and treatment  For Home Health: Skilled nurse to perform [facility] dressing change to sacrum 3 times weekly and prn seal breach or vac malfunction. May soak dressing in saline prior to removal if necessary. Cleanse with NS or wound cleanser  and apply Wound Vac at 125 - 175 mmHG continuous suction. May titrate up or down by 68mmHg to obtain seal, for increased or decreased amount of drainage. May apply prn non adherent dressing to protect tendons, ligaments, bone, areas not intended for granulation. Use white foam for all tunnels and undermining or areas where wound base is not easily visualized. When using white foam, increase setting to 164mmhg or greater. May apply NS wet to dry dressing prn wound vac problems. Instruct pt/caregiver regarding procedures in case of wound vac problems.   Follow-Ups  Schedule an appointment with Ignacia Palma, PA (Physician Assistant) in 1 week (09/01/2022)  Schedule an appointment with Judith Part, MD (Endocrinology) in 2 weeks (09/08/2022)  Continue to follow up with [facility]: Positive 08/23/22  08/25/22 36.7  08/24/22 36.9  08/23/22 36.2  Previously positive also: 08/09/22  ====================================================================  Referral has been accepted and completed.  Referral Date (M0104): 08/12/2021  Ordered by Physician or his agent (name): Chauncey Cruel FNP  Provider signing plan of care: Chauncey Cruel FNP  Physician to follow patient's care (the person listed here will be responsible for signing ongoing orders): [place], Theressa Stamps to [facility] Encounter (Locator #[id number])  I attest that I or another qualified licensed provider saw [name] Short 90 days prior to or 30 days post admission and this face to face encounter meets the necessary Home Health requirements. The face to face encounter occurred by Chauncey Cruel FNP (This physician will no longer follow the patient in the community and, therefore, I am certifying that this face to face summary is a permanent part of the patient's medical record in your office as required.)  The encounter with the patient was in whole, or in part, for the following medical condition, which is the primary reason for home health care. PDGM Dx:  Stage IV pressure ulcer of sacral Region .Marland KitchenFound in: Media Document:Discharge Summary: Date: 08/05/2022  Disciplines requested: SN, PT, and OT  Services to provide: SN Evaluation and Treat, Medication and Disease Management, wound care, PT OT Eval and treat  Wound Etiology: Pressure  Special Instructions: Wound care with wound VAC as directed: Sacrum- Cleanse with NS apply wound VAC, bridge to hip, change M-W-F 125 mm HG Continuous (last changed 08/11/2021)  Follow up: Surgery andPCP as indicated  Covid-19 : Neg___ Pos_X__ Not Tested____Vaccinated_____  Dates tested 08/09/2021  Temp x 3 days  1/8-98.3  1/7-97.5  08/09/22 98.7  Zone: B  Physician Start of Care date will be within 48 hrs  PDGM Type (Community vs. Institutional): Institutional  PDGM Time (Early vs. Late): Late  PDGM Diagnosis Can  be found in Document From: Media on Discharge Summary on Date: 08/05/2022  List of Comorbidities:  DM  HTN  Cervical Myelopathy  Hypercholesterolemia  CVA  Osteoarthritis  Sleep Apnea               DME         Walker  3-in-1 Commode  Manual wheelchair

## 2022-09-15 NOTE — Home Health (Signed)
Skilled reason for visit: Wound care    Caregiver involvement: Pt resides in a single story home with her daughter, pt amb with use of a walker and requires asst with all ADL's and medications, a PCA comes in during the week intermittently when the agency has someone available and the daughter is the primary CG and provides transportation to appts and is available to asst as needed.    This visit: Pt is A&Ox4, able to communicate her needs, no c/o pain or discomfort voiced, pts sacral wound remains stable, continues to reduce in size in drainage, healing well, tx in progress continues.    Home health supplies by type and quantity ordered/delivered this visit include: N/A    Medications reviewed and all medications are available in the home this visit.    MD notified of any discrepancies/look a-like medications/medication interactions: NA    Medications are effective at this time.     Patient education provided this visit: SEE INTERVENTIONS LIST    Pt advised not to start, stop or change the way she is currently taking any of her medications without first consulting with the MD.    SN instructed pt/CG to promptly report any redness, swelling, warmth, fever, chills, increased heart/respiratory rate, uncontrolled pain/tenderness, drainage: green/yellow/brown, or odor to MD immediately. No s/s of infection noted this visit no odor or foul drainage noted.      SN instructed pt/CG to notify home health or physician for the following wound healing complications: increased drainage, pus or a foul odor coming from the wound, fever, muscle, joint, or body aches, sweating, increased swelling, redness or red streaks surrounding the wound; dramatic change in color or size of wound; increase in pain at wound site in spite of interventions.    SN advised CG during shower/bath time to assess pts skin for evidence of pressure injury such as: non-blanchable erythema, blistering, discoloration, alteration of oral mucosa: signs of  mucositis, yeast infection, or breaks in membrane, edema, rash and skin tears. Report location, size, and severity of new or worsening findings to physician.    Agency Progress toward goals: progressing well    Patient's Progress towards personal goals: progressing well    Pt acknowledged understanding of all education provided.    Patient's Progress towards personal goals: when patient reaches goals and medication is managed, disease processes are understood patient agrees and understand that discharge will take place.

## 2022-09-16 ENCOUNTER — Encounter: Payer: PRIVATE HEALTH INSURANCE | Primary: Physician Assistant

## 2022-09-16 ENCOUNTER — Encounter: Admit: 2022-09-16 | Discharge: 2022-09-16 | Payer: PRIVATE HEALTH INSURANCE | Primary: Physician Assistant

## 2022-09-16 NOTE — Home Health (Signed)
SUBJECTIVE: Pt reports she is doing well today   CAREGIVER INVOLVEMENT/ASSISTANCE NEEDED FOR: Pts dtr is present during PT session and assist pt with all needs prn.  Pts has paid caregiver during the day but caregiver left for lunch break when LPTA arrived today.  .  OBJECTIVE: See interventions.  PATIENT EDUCATION PROVIDED THIS VISIT: Pt instructed to continue HEP 2x/day to BLEs in sitting/standing with walker.  Reviewed with pt/pts dtr importance of having clear pathways.  PATIENT RESPONSE TO EDUCATION PROVIDED: Pt reports she walks a few times a day with FWW in home and stands/transfers often to [facility] secondary to frequent urination.  PATIENT RESPONSE TO TREATMENT: Pt was able to perform all standing exercises today without rest break requires and was able to safely manage through narrow passageways in living room to/from front door.  .  ASSESSMENT OF PROGRESS TOWARD GOALS: Outdoor amb deferred today secondary to inclement weather.  Pt demonstartes a good understanding of HEP and good teach back method for standing/sitting HEP except cuing to increase upright posture.  Pt managed amb through narrow passageways through living room with half folded walker and occ reaching out for boxes for balance.  Marland Kitchen  PLAN FOR NEXT VISIT: Will plan to attempt stair amb and outdoor amb next visit.  THE FOLLOWING DISCHARGE PLANNING WAS DISCUSSED WITH THE PATIENT/CAREGIVER: Pt is scheduled to continue PT 1 more visit this week then 2w1 with dc at end of week next week and HEP, caregivers, SN and OT.

## 2022-09-16 NOTE — Case Communication (Signed)
Occupational Therapy Evaluation    1w4    [name] Short is unable to safely ambulate in her home due to excessive clutter and narrow walkways.  Daughter has been instructed on importance of clearing space to maximize pt's safety with ambulation and allowing the RW to safely fit throughout the home.  Pt's bedroom is not accessible to excessive clutter and pt reports that the bed is not put together yet.  Therefore, she sleeps on the couch.  C ouch cushions have been lifted by placing thick pads/full packs of briefs under the cushions.  [name] Short performed sit <> stand from the couch with CGA for safety.  She denies having any recent falls or needing assistance from daughter with transfer; however, she transfers with wide BOS and appears unsteady.  Instructed pt to either use the RW with all transfers or keep 3:1 adjacent to the couch so that she can use arm rests for balance.   Pt agreeable to use the latter strategy due to space limitations with the RW.  [name] Short performed transfer onto commode with CGA for safety on first attempt.  She is able to perform static standing with support from the RW and close SBA/CGA.  While seated, she performs her own self care after s/u.  Daughter reports assisting with LB bathing and toileting due to sacral wound and attempts to keep it clean.  [name] Short is asking for a few  OT visits to review energy conservation with ADL so that it is less taxing and maximize her strength and safety with functional transfers during ADL tasks.    Therapy Functional Score Assessment  Question   Score  M1800 Grooming  2      M1810 Upper Dressing 2     M1820 Lower Dressing 3     M1830 Bathing  6     [id number] Toilet Transfer  4   M1845 Toileting Hygiene 3  M1850 Transfer  2           M1860 Ambulation  NT  M1400 Dyspnea                      0      Est number therapy visits      4

## 2022-09-17 ENCOUNTER — Encounter: Payer: PRIVATE HEALTH INSURANCE | Primary: Physician Assistant

## 2022-09-17 ENCOUNTER — Encounter: Admit: 2022-09-17 | Discharge: 2022-09-17 | Payer: PRIVATE HEALTH INSURANCE | Primary: Physician Assistant

## 2022-09-17 NOTE — Home Health (Signed)
Skilled reason for visit: Wound care    Caregiver involvement: Pt resides in a single story home with her daughter, pt amb with use of a walker and requires asst with all ADL's and medications, a PCA comes in during the week intermittently when the agency has someone available and the daughter is the primary CG and provides transportation to appts and is available to asst as needed.    This visit: Pt is A&Ox4, able to communicate her needs, no c/o pain or discomfort voiced, pts sacral wound continues to heal well, scant amt of drainage, less than 1.5 in size, tx in progress continues.    Home health supplies by type and quantity ordered/delivered this visit include: N/A    Medications reviewed and all medications are available in the home this visit.    MD notified of any discrepancies/look a-like medications/medication interactions: NA    Medications are effective at this time.     Patient education provided this visit: SEE INTERVENTIONS LIST    Pt advised not to start, stop or change the way she is currently taking any of her medications without first consulting with the MD.    SN instructed pt/CG to promptly report any redness, swelling, warmth, fever, chills, increased heart/respiratory rate, uncontrolled pain/tenderness, drainage: green/yellow/brown, or odor to MD immediately. No s/s of infection noted this visit no odor or foul drainage noted.      SN instructed pt/CG to notify home health or physician for the following wound healing complications: increased drainage, pus or a foul odor coming from the wound, fever, muscle, joint, or body aches, sweating, increased swelling, redness or red streaks surrounding the wound; dramatic change in color or size of wound; increase in pain at wound site in spite of interventions.    SN advised CG during shower/bath time to assess pts skin for evidence of pressure injury such as: non-blanchable erythema, blistering, discoloration, alteration of oral mucosa: signs of  mucositis, yeast infection, or breaks in membrane, edema, rash and skin tears. Report location, size, and severity of new or worsening findings to physician.    Agency Progress toward goals: progressing well    Patient's Progress towards personal goals: progressing well    Pt acknowledged understanding of all education provided.    Patient's Progress towards personal goals: when patient reaches goals and medication is managed, disease processes are understood patient agrees and understand that discharge will take place.

## 2022-09-18 ENCOUNTER — Encounter: Admit: 2022-09-18 | Discharge: 2022-09-18 | Payer: PRIVATE HEALTH INSURANCE | Primary: Physician Assistant

## 2022-09-18 NOTE — Home Health (Addendum)
SUBJECTIVE: Pt reports she is feeling like she is getting back to her PLOF.  Pt reports she continues to require frequent rest breaks secondary to c/o fatigue.  CAREGIVER INVOLVEMENT/ASSISTANCE NEEDED FOR: pts dtr and paid caregiver assist pt with all needs prn and pts dtr present during PT session.  Pts paid caregiver present at beginning of PT session but takes lunch breaks per caregiver during PT sessions.  .  OBJECTIVE: See interventions.  PATIENT EDUCATION PROVIDED THIS VISIT: Pt instructed to continue HEP 2x/day and amb with FWW in home every 2-3 waking hours.  Continue to recommend removing clutter and clearing (increasing width of pathways) pathways for safety with all amb.  PATIENT RESPONSE TO EDUCATION PROVIDED: Pts dtr reports she is continuing to slowly remove clutter from home.  Pt reports she is doing her HEP during the day and amb atleast 3-4x/day with walker and S   PATIENT RESPONSE TO TREATMENT:Pts vitals WFL including BS (per pts dtr).  Pt was able to tolerate amb up/down steps and outdoor amb along with reviewing standing exercises with 2 sitting break required.  .  ASSESSMENT OF PROGRESS TOWARD GOALS: Pt amb up/down steps with decreased difficulty requiring SBA vs CGA last attempt.  Pt continues to require assist with managing walker up/down steps.  Pt amb outdoors with FWW and throw narrow passageways in living room with folded walker and requires cuing for safe walker placement and to pace herself.  Marland Kitchen  PLAN FOR NEXT VISIT: Will plan to attempt outdoor amb and stair amb next visit.  THE FOLLOWING DISCHARGE PLANNING WAS DISCUSSED WITH THE PATIENT/CAREGIVER:Pt is scheduled to continue PT 2w1 with dc at end of next week.

## 2022-09-18 NOTE — Unmapped (Signed)
Code Status: Full Code

## 2022-09-19 ENCOUNTER — Encounter: Payer: PRIVATE HEALTH INSURANCE | Primary: Physician Assistant

## 2022-09-20 ENCOUNTER — Encounter: Admit: 2022-09-20 | Discharge: 2022-09-20 | Payer: PRIVATE HEALTH INSURANCE | Primary: Physician Assistant

## 2022-09-20 NOTE — Home Health (Signed)
Skilled reason for visit: Wound care    Caregiver involvement: Pt resides in a single story home with her daughter, pt amb with use of a walker and requires asst with all ADL's and medications, a PCA comes in during the week intermittently when the agency has someone available and the daughter is the primary CG and provides transportation to appts and is available to asst as needed.    This visit: Pt is A&Ox4, able to communicate her needs, no c/o pain or discomfort voiced, pts sacral wound has reduced to approx 1cmx0.2, scant amt of serosang drainage, less than 1.5 in size, tx in progress continues, SN continues to stress the importance of the risks for pressure injury such as sedentary lifestyle/ immobility, improper nutrition, moisture, friction, shear, incontinence, poor hygiene, age, conditions that affect blood flow to the extremities; SN suggested prevention strategies such as frequent position changes (at least every 2 hours), proper positioning/use of pressure relieving devices to alleviate pressure points, maintaining personal hygiene, checking skin regularly, and following physician-recommended diet. Both pt and CG acknowledged understanding.    Home health supplies by type and quantity ordered/delivered this visit include: N/A    Medications reviewed and all medications are available in the home this visit.    MD notified of any discrepancies/look a-like medications/medication interactions: NA    Medications are effective at this time.     Patient education provided this visit: SEE INTERVENTIONS LIST    Pt advised not to start, stop or change the way she is currently taking any of her medications without first consulting with the MD.    SN instructed pt/CG to promptly report any redness, swelling, warmth, fever, chills, increased heart/respiratory rate, uncontrolled pain/tenderness, drainage: green/yellow/brown, or odor to MD immediately. No s/s of infection noted this visit no odor or foul drainage  noted.      SN instructed pt/CG to notify home health or physician for the following wound healing complications: increased drainage, pus or a foul odor coming from the wound, fever, muscle, joint, or body aches, sweating, increased swelling, redness or red streaks surrounding the wound; dramatic change in color or size of wound; increase in pain at wound site in spite of interventions.    SN advised CG during shower/bath time to assess pts skin for evidence of pressure injury such as: non-blanchable erythema, blistering, discoloration, alteration of oral mucosa: signs of mucositis, yeast infection, or breaks in membrane, edema, rash and skin tears. Report location, size, and severity of new or worsening findings to physician.    Agency Progress toward goals: progressing well    Patient's Progress towards personal goals: progressing well    Pt acknowledged understanding of all education provided.    Patient's Progress towards personal goals: when patient reaches goals and medication is managed, disease processes are understood patient agrees and understand that discharge will take place.

## 2022-09-22 ENCOUNTER — Encounter: Admit: 2022-09-22 | Discharge: 2022-09-22 | Payer: PRIVATE HEALTH INSURANCE | Primary: Physician Assistant

## 2022-09-22 ENCOUNTER — Encounter: Payer: PRIVATE HEALTH INSURANCE | Primary: Physician Assistant

## 2022-09-22 NOTE — Home Health (Signed)
Skilled reason for visit: Wound care    Caregiver involvement: Pt resides in a single story home with her daughter, pt amb with use of a walker and requires asst with all ADL's and medications, a PCA comes in during the week intermittently when the agency has someone available and the daughter is the primary CG and provides transportation to appts and is available to asst as needed.    This visit: Pt is A&Ox4, able to communicate her needs, no c/o pain or discomfort voiced, SN provided wound care to pts sacral wound, area remains stable and continues to heal well, tx in progress continues.    Home health supplies by type and quantity ordered/delivered this visit include: N/A    Medications reviewed and all medications are available in the home this visit.    MD notified of any discrepancies/look a-like medications/medication interactions: NA    Medications are effective at this time.     Patient education provided this visit: SEE INTERVENTIONS LIST    Pt advised not to start, stop or change the way she is currently taking any of her medications without first consulting with the MD.    SN instructed pt/CG to promptly report any redness, swelling, warmth, fever, chills, increased heart/respiratory rate, uncontrolled pain/tenderness, drainage: green/yellow/brown, or odor to MD immediately. No s/s of infection noted this visit no odor or foul drainage noted.      SN instructed pt/CG to notify home health or physician for the following wound healing complications: increased drainage, pus or a foul odor coming from the wound, fever, muscle, joint, or body aches, sweating, increased swelling, redness or red streaks surrounding the wound; dramatic change in color or size of wound; increase in pain at wound site in spite of interventions.    SN advised CG during shower/bath time to assess pts skin for evidence of pressure injury such as: non-blanchable erythema, blistering, discoloration, alteration of oral mucosa: signs of  mucositis, yeast infection, or breaks in membrane, edema, rash and skin tears. Report location, size, and severity of new or worsening findings to physician.    Agency Progress toward goals: progressing well    Patient's Progress towards personal goals: progressing well    Pt acknowledged understanding of all education provided.    Patient's Progress towards personal goals: when patient reaches goals and medication is managed, disease processes are understood patient agrees and understand that discharge will take place.

## 2022-09-23 ENCOUNTER — Encounter: Payer: PRIVATE HEALTH INSURANCE | Primary: Physician Assistant

## 2022-09-23 ENCOUNTER — Encounter: Admit: 2022-09-23 | Discharge: 2022-09-23 | Payer: PRIVATE HEALTH INSURANCE | Primary: Physician Assistant

## 2022-09-23 NOTE — Home Health (Signed)
SUBJECTIVE: Pt reports she is feeling stronger and feels like she is able to do more now-going to kitchen for drink/snack,etc...  CAREGIVER INVOLVEMENT/ASSISTANCE NEEDED FOR: pts dtr present during PT session and assist pt with all needs prn.  Pt has paid caregiver daily but not present during PT session.  .  OBJECTIVE: See interventions.  PATIENT EDUCATION PROVIDED THIS VISIT: Pt instructed to continue HEP 2x/day and amb with FWW in home throughout the day.  PATIENT RESPONSE TO EDUCATION PROVIDED: Pt reports she is doing HEP to keep from getting too stiff throughout the day and feels she is much stronger now.  PATIENT RESPONSE TO TREATMENT: Outdoor amb declined secondary to cold weather.  Pt amb in home with I using FWW.  Vitals remained WFL.  Marland Kitchen  ASSESSMENT OF PROGRESS TOWARD GOALS:   Bed mobility:pt is I on couch with UE support with all bed mobility which demonstrates an improvement from initial evaluation of min assist.  Pts dtr reports pt requires assist at night and first thing in AM.  This allows pt to be more independent.  Transfers: Pt is I and UE support with all transfers which demonstrates an improvement from initial evaluation of CGA/min assist.  This allows the pt increased functional independence and mobilty.  Gait/wc mobility:Pt is able to amb 50 feet outside over even/uneven surfaces with S assist with FWW AD with safety cues for proper walker placement and posture.  Pt amb in home with DS using walker to safely manuver around obstacles and through narrow passageways.  This represents an improvement from initial evaluation of 20 ft with FWW AD on level surfaces with SB assist.  Stairs:as of 12/15 Pt amb up/down threshold to porch with FWW and S for safety, occ cuing for safe walker placement and to manage door.  Pt amb up/down 3 steps with both hands on one HR and SBA with assist to manage walker up/down steps.  Pt requires occ cuing for proper foot placement..  Balance:Pts final Tinetti score is  18/28 which is an improvement from initial evaluation score of 12/28.  This allows the pt to be functionally more independent and have a decreased fall risk.  THE FOLLOWING DISCHARGE PLANNING WAS DISCUSSED WITH THE PATIENT/CAREGIVER: Pt is scheduled for dc from [facility] PT services next visit with HEP, caregiver, OT and SN.

## 2022-09-24 ENCOUNTER — Encounter: Payer: PRIVATE HEALTH INSURANCE | Primary: Physician Assistant

## 2022-09-24 ENCOUNTER — Encounter: Admit: 2022-09-24 | Discharge: 2022-09-24 | Payer: PRIVATE HEALTH INSURANCE | Primary: Physician Assistant

## 2022-09-24 NOTE — Home Health (Signed)
Skilled reason for visit: Wound care    Caregiver involvement: Pt resides in a single story home with her daughter, pt amb with use of a walker and requires asst with all ADL's and medications, a PCA comes in during the week intermittently when the agency has someone available and the daughter is the primary CG and provides transportation to appts and is available to asst as needed.    This visit: Pt is A&Ox4, able to communicate her needs, no c/o pain or discomfort voiced, small wound to pts scaral area remains stable and continues to heal, area remains stable, tx in progress continues.    Home health supplies by type and quantity ordered/delivered this visit include: N/A    Medications reviewed and all medications are available in the home this visit.    MD notified of any discrepancies/look a-like medications/medication interactions: NA    Medications are effective at this time.     Patient education provided this visit: SEE INTERVENTIONS LIST    Pt advised not to start, stop or change the way she is currently taking any of her medications without first consulting with the MD.    SN instructed pt/CG to promptly report any redness, swelling, warmth, fever, chills, increased heart/respiratory rate, uncontrolled pain/tenderness, drainage: green/yellow/brown, or odor to MD immediately. No s/s of infection noted this visit no odor or foul drainage noted.      SN instructed pt/CG to notify home health or physician for the following wound healing complications: increased drainage, pus or a foul odor coming from the wound, fever, muscle, joint, or body aches, sweating, increased swelling, redness or red streaks surrounding the wound; dramatic change in color or size of wound; increase in pain at wound site in spite of interventions.    SN advised CG during shower/bath time to assess pts skin for evidence of pressure injury such as: non-blanchable erythema, blistering, discoloration, alteration of oral mucosa: signs of  mucositis, yeast infection, or breaks in membrane, edema, rash and skin tears. Report location, size, and severity of new or worsening findings to physician.    Agency Progress toward goals: progressing well    Patient's Progress towards personal goals: progressing well    Pt acknowledged understanding of all education provided.    Patient's Progress towards personal goals: when patient reaches goals and medication is managed, disease processes are understood patient agrees and understand that discharge will take place.

## 2022-09-24 NOTE — Telephone Encounter (Signed)
Spoke to Hardtner from home health who is stating she no longer needs wound vac measurements are 0.5 by 0.1 no draianage times a week and half

## 2022-09-25 ENCOUNTER — Encounter: Payer: PRIVATE HEALTH INSURANCE | Primary: Physician Assistant

## 2022-09-26 ENCOUNTER — Encounter: Admit: 2022-09-26 | Discharge: 2022-09-26 | Payer: PRIVATE HEALTH INSURANCE | Primary: Physician Assistant

## 2022-09-26 NOTE — Home Health (Signed)
Pt was unavailable for final PT DC visit as pt daughter was not going to be home to answer the door. Pt was agreeable to a non- billible PT discipline DC as she was aware that or orders ended on 09/26/22.Marland KitchenPatient is s/p DM2 and has been treated for strengthening, gait training, stair training, HEP training, safety training, and balance training.  On [facility] 08/28/22 strength was R hip flexors 3/5 R quads 3/5, R hams 3+/5, L LE hip flexors, quads and hamstrings  3+/5.Today at DC strength is NA as pt was not available for DC visit. On SOC bed mobility was sleeps on couch, unable to get to bedroom due to extensive clutter sit to supine with MIN A for B LE managment. supine to sit wtih SBA. Per PTA visit 09/23/22 Bed mobility:pt is I on couch with UE support with all bed mobility which demonstrates an improvement from initial evaluation of min assist. Pts dtr reports pt requires assist at night and first thing in AM. This allows pt to be more independent.  On [place] transfers were sit to stand from low couch with B UE support for push off with CGA/ MIN A Assistance needs to make space so the walker could be placed infont of pt and to remove clutter on the ground  Per PTA visit 09/23/22 Pt is I and UE support with all transfers .  On SOC gait was :pt amb 20 ftx 2 to kitchen and back  with RW with step too gait pattern with SBA. Decreased B step length noted . VC needed to keep the walker closer as she pushs it to far infront of her. Fwd flexed posture noted. B feet did clear the floor during swing phase of gait. slow cadence. Increased time needed to complete task. Therapist A with wound vac tubing. Pt is limited in distances with amb secondary to increased clutter. Recommeded that pathways pt amb in be free of clutter especially pathway to the front door. Per PTA visit 09/23/22 Pt is able to amb 50 feet outside over even/uneven surfaces with S assist with FWW AD with safety cues for proper walker placement and posture. Pt amb in  home with DS using walker to safely manuver around obstacles and through narrow passageways. Pt did make progress but did not met goal of amb 70 ft with RW. On [place] stairs were NA  Per PTA visit 09/18/22.Pt amb up/down threshold to porch with FWW and S for safety, occ cuing for safe walker placement and to manage door. Pt amb up/down 3 steps with both hands on one HR and SBA with assist to manage walker up/down steps. Pt requires occ cuing for proper foot placement. On [place] Pt scored 12/28 on Tinetti Balance Assessment placing pt at high  risk for falls.  Per PTA visit 09/23/22 :Pts final Tinetti score is 18/28 t placing pt as a low  fall risk.. Pt did have more than a 4 point statistical improvement. Pt has made progress and has met all goals.  Discharge plan: Discharge from [place] PT services as goals have been met.  Jettie Booze D, PA  office has been notified of DC from [place] with goals met. Pt will cont with HHOT and SN services

## 2022-09-27 ENCOUNTER — Encounter: Admit: 2022-09-27 | Discharge: 2022-09-27 | Payer: PRIVATE HEALTH INSURANCE | Primary: Physician Assistant

## 2022-09-27 NOTE — Home Health (Signed)
Skilled reason for visit: Wound care    Caregiver involvement: Pt resides in a single story home with her daughter, pt amb with use of a walker and requires asst with all ADL's and medications, a PCA comes in during the week intermittently when the agency has someone available and the daughter is the primary CG and provides transportation to appts and is available to asst as needed.    This visit: Pt is A&Ox4, able to communicate her needs, no c/o pain or discomfort voiced, small wound to pts scaral area remains stable and continues to heal, area remains stable, tx in progress continues.    Home health supplies by type and quantity ordered/delivered this visit include: N/A    Medications reviewed and all medications are available in the home this visit.    MD notified of any discrepancies/look a-like medications/medication interactions: NA    Medications are effective at this time.     Patient education provided this visit: SEE INTERVENTIONS LIST    Pt advised not to start, stop or change the way she is currently taking any of her medications without first consulting with the MD.    SN instructed pt/CG to promptly report any redness, swelling, warmth, fever, chills, increased heart/respiratory rate, uncontrolled pain/tenderness, drainage: green/yellow/brown, or odor to MD immediately. No s/s of infection noted this visit no odor or foul drainage noted.      SN instructed pt/CG to notify home health or physician for the following wound healing complications: increased drainage, pus or a foul odor coming from the wound, fever, muscle, joint, or body aches, sweating, increased swelling, redness or red streaks surrounding the wound; dramatic change in color or size of wound; increase in pain at wound site in spite of interventions.    SN advised CG during shower/bath time to assess pts skin for evidence of pressure injury such as: non-blanchable erythema, blistering, discoloration, alteration of oral mucosa: signs of  mucositis, yeast infection, or breaks in membrane, edema, rash and skin tears. Report location, size, and severity of new or worsening findings to physician.    Agency Progress toward goals: progressing well    Patient's Progress towards personal goals: progressing well    Pt acknowledged understanding of all education provided.    Patient's Progress towards personal goals: when patient reaches goals and medication is managed, disease processes are understood patient agrees and understand that discharge will take place.

## 2022-09-29 ENCOUNTER — Encounter: Admit: 2022-09-29 | Discharge: 2022-09-29 | Payer: PRIVATE HEALTH INSURANCE | Primary: Physician Assistant

## 2022-09-29 NOTE — Home Health (Signed)
Skilled reason for visit: Wound care    Caregiver involvement: Pt resides in a single story home with her daughter, pt amb with use of a walker and requires asst with all ADL's and medications, a PCA comes in during the week intermittently when the agency has someone available and the daughter is the primary CG and provides transportation to appts and is available to asst as needed.    This visit: Pt is A&Ox4, able to communicate her needs, no c/o pain or discomfort voiced, open area to pts sacrum measures less than 1cm in length, remains stable, tx in progress continues. Pts daughter instructed on how to perform wound care on days opposite SN visits, demonstrated proper technique.    Home health supplies by type and quantity ordered/delivered this visit include: N/A    Medications reviewed and all medications are available in the home this visit.    MD notified of any discrepancies/look a-like medications/medication interactions: NA    Medications are effective at this time.     Patient education provided this visit: SEE INTERVENTIONS LIST    Pt advised not to start, stop or change the way she is currently taking any of her medications without first consulting with the MD.    SN instructed pt/CG to promptly report any redness, swelling, warmth, fever, chills, increased heart/respiratory rate, uncontrolled pain/tenderness, drainage: green/yellow/brown, or odor to MD immediately. No s/s of infection noted this visit no odor or foul drainage noted.      SN instructed pt/CG to notify home health or physician for the following wound healing complications: increased drainage, pus or a foul odor coming from the wound, fever, muscle, joint, or body aches, sweating, increased swelling, redness or red streaks surrounding the wound; dramatic change in color or size of wound; increase in pain at wound site in spite of interventions.    SN advised CG during shower/bath time to assess pts skin for evidence of pressure injury  such as: non-blanchable erythema, blistering, discoloration, alteration of oral mucosa: signs of mucositis, yeast infection, or breaks in membrane, edema, rash and skin tears. Report location, size, and severity of new or worsening findings to physician.    Agency Progress toward goals: progressing well    Patient's Progress towards personal goals: progressing well    Pt acknowledged understanding of all education provided.    Patient's Progress towards personal goals: when patient reaches goals and medication is managed, disease processes are understood patient agrees and understand that discharge will take place.

## 2022-10-01 ENCOUNTER — Encounter: Admit: 2022-10-01 | Discharge: 2022-10-01 | Payer: PRIVATE HEALTH INSURANCE | Primary: Physician Assistant

## 2022-10-01 NOTE — Home Health (Signed)
SUBJECTIVE: I am supposed to stand up every hour.    CAREGIVER INVOLVEMENT/ASSISTANCE NEEDED FOR: The patient's daughter assists with ADL/IADL tasks and transportation.  The daughter was present.  .  OBJECTIVE: See interventions.  PATIENT EDUCATION PROVIDED THIS VISIT: Treatment focused on functional transfers, BUE strengthening and compensatory techniques including energy conservation strategies during ADL tasks.    PATIENT RESPONSE TO EDUCATION PROVIDED: The patient was instructed on energy conservation while performing bathing, dressing, and setup such as set clothing out night before, gather items required to perform task in one trip, sit while performing tasks, take rest breaks as needed, perform shower/bathing on days with now other appointments or activities shceduled, etc; following the training the patient was able to recall 2 of the energy conserving strategies.  The patient was trained on the home exercise program for B UE strengthening against gravity for shoulder flexion/extension, overhead presses, chest press, ab/adductions and elbow flexion/extension using soup cans for resistance 2 sets of 10 repetitions.  The patient was trained in safe transfers using appropriate body mechanics and the necessary equipment: following the training the patient performed sit to stands from the couch and bedside commode with Supervision.  The patient/caregiver were instructed on the home exercise program to increase muscle strengthening: the patient was able to return demonstrate the exercises with supervison.    PATIENT RESPONSE TO TREATMENT: The patient tolerated treatment well, maintaining vital signs within acceptable parameters and without complaints of increased pain level.  .  ASSESSMENT OF PROGRESS TOWARD GOALS: The patient has made good progress towards all the short term goals. The patient is able to utilize energy conservation strategies during ADLs and with functional transfers.  Marland Kitchen  PLAN FOR NEXT VISIT:  Complete OT discharge.    THE FOLLOWING DISCHARGE PLANNING WAS DISCUSSED WITH THE PATIENT/CAREGIVER: Discharge the patient to self and daughter with HEP,  when all goals are met or maximum potential has been reached.  There is one visit remaining.

## 2022-10-01 NOTE — Home Health (Signed)
Skilled reason for visit: Wound care    Caregiver involvement: Pt resides in a single story home with her daughter, pt amb with use of a walker and requires asst with all ADL's and medications, a PCA comes in during the week intermittently when the agency has someone available and the daughter is the primary CG and provides transportation to appts and is available to asst as needed.    This visit: Pt is A&Ox4, able to communicate her needs, no c/o pain or discomfort voiced, small area to pts sacrum remains stable, tx in progress continues, SN has began to talk to pt and CG about pt being discharged soon and snswering any questions they may have, pt has a follow up appt with her wound care MD on 10/22/22, MD is aware of current size and status of pts wound.    Home health supplies by type and quantity ordered/delivered this visit include: N/A    Medications reviewed and all medications are available in the home this visit.    MD notified of any discrepancies/look a-like medications/medication interactions: NA    Medications are effective at this time.     Patient education provided this visit: SEE INTERVENTIONS LIST    Pt advised not to start, stop or change the way she is currently taking any of her medications without first consulting with the MD.  SN instructed pt/CG to promptly report any redness, swelling, warmth, fever, chills, increased heart/respiratory rate, uncontrolled pain/tenderness, drainage: green/yellow/brown, or odor to MD immediately.     SN advised CG during shower/bath time to assess pts skin for evidence of pressure injury such as: non-blanchable erythema, blistering, discoloration, alteration of oral mucosa: signs of mucositis, yeast infection, or breaks in membrane, edema, rash and skin tears. Report location, size, and severity of new or worsening findings to physician.    Agency Progress toward goals: progressing well    Patient's Progress towards personal goals: progressing well    Pt  acknowledged understanding of all education provided.    Patient's Progress towards personal goals: when patient reaches goals and medication is managed, disease processes are understood patient agrees and understand that discharge will take place.

## 2022-10-03 ENCOUNTER — Telehealth

## 2022-10-03 MED ORDER — NYSTATIN 100000 UNIT/GM EX POWD
100000 UNIT/GM | CUTANEOUS | 0 refills | Status: DC
Start: 2022-10-03 — End: 2023-02-03

## 2022-10-03 NOTE — Telephone Encounter (Signed)
Complaining sacral pain - hard as well as yeast infection    Nestor Ramp, DO

## 2022-10-03 NOTE — Telephone Encounter (Signed)
Lvm on daughters number letting her know Dr Bing Ree ordered a CT scan to check for fluid buildup where pt is c/o pain in sacral area and that yeast medication called in to pharmacy on file and to return call with any questions.

## 2022-10-03 NOTE — Telephone Encounter (Signed)
Lvm asking pt daughter to return call, Dr Bing Ree will send something for yeast infection but not pain meds, and need to know what pharmacy.

## 2022-10-04 ENCOUNTER — Encounter: Admit: 2022-10-04 | Discharge: 2022-10-04 | Payer: PRIVATE HEALTH INSURANCE | Primary: Physician Assistant

## 2022-10-04 DIAGNOSIS — L98421 Non-pressure chronic ulcer of back limited to breakdown of skin: Secondary | ICD-10-CM

## 2022-10-04 NOTE — ED Triage Notes (Signed)
Has vaginal itching x1 week, given script for fluconazole, has pill but has not taken yet.  Also has healing wound on buttucks but has "raised area" on right buttucks that they are wanting a ct.

## 2022-10-04 NOTE — Home Health (Signed)
Caregiver involvement: Daughter is CG she .Assists with ADLs, Medication management, Transportation to appointments, Meal prep and assists with ambulation.        Medications reconciled and all medications are available in the home this visit.  The following education was provided regarding medications, All medications should be given the same time daily. Medications  are in the home at this time.      Home health supplies by type and quantity ordered/delivered this visit include: All supplies are in the home.     Patient education provided this visit:Patient started on Fluconaole I went over the things she needs to know with this Antifungal medication,   I went over the importance of taking them as prescribed,  taking them the same time every day and taking all till they are gone.  Patient is a fall risk, I reccommend supervision at all times, keeping walk ways/halls clear of clutter.  The importance of frequent and regular blood sugar checks before meals to keep BS under control.   PATIENT SHOULD EAT A DIET LOW IN SODIUM, WE WENT OVER HOW TO READ A LABEL AND WHAT FOODS TO STAY AWAY FROM.     Progress toward goals:wound is nearly healed. small area open no depth, and cleansed with DWC and Zinc product applied.      Home exercise program: F/u with PCP as ordered.  Take all medications as prescribed.  Patient to have CT of area shortly to r/o further infection/abcess.  Keep area clean and dry with minimal pressure on wound.      Continued need for the following skills: SN    The following discharge planning was discussed with the pt/caregiver: Will discharge when education is completed,  patient is medically stable and wound healed, or family can manage dressing. Marland Kitchen

## 2022-10-04 NOTE — ED Provider Notes (Signed)
EMERGENCY DEPARTMENT HISTORY AND PHYSICAL EXAM        Date: 10/04/2022  Patient Name: [name] Short    History of Presenting Illness     Chief Complaint   Patient presents with    Vaginal Itching     Vaginal itching since last week.. has healing wound on buttucks, being seen by wound care nurse.       History Provided By: History obtained from patient    HPI: [name] Short, 67 y.o. female presents to the ED with cc of sacral wound with adjacent area of swelling, weakness x 7 days    Patient has history of sacral ulcer that is well cared for by her daughter who is in the emergency department with her.  However daughter is concerned about an area of swelling and firmness in the right buttock adjacent to this.  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  She was admitted to the hospital November 17 to July 04, 2022.  Daughter reports that the patient is having weakness over the last few days and she is concerned about sepsis given patient history.  The weakness is described that patient is ambulatory with her walker but unable to stand for prolonged periods of time at her walker.  Phone call was made to general surgeon Dr. Bing Ree, who ordered for CT abdomen pelvis with IV contrast.    Vaginal itch noted in triage note however daughter clarifies that it should originated at the sacral wound and at 1 point patient stated this was spreading to vaginal area although patient denies vaginal itch.    No nausea, vomiting, diarrhea, fever, chills, chest pain, shortness of breath, leg swelling     There are no other complaints, changes, or physical findings at this time.    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     Current Outpatient Medications on File Prior to Encounter   Medication Sig Dispense Refill    fluconazole  (DIFLUCAN) 100 MG tablet Take 100 mg by mouth daily.      nystatin (MYCOSTATIN) 100000 UNIT/GM powder Apply 3 times daily. 30 g 0    insulin glargine (LANTUS) 100 UNIT/ML injection vial Inject 10 Units into the skin daily 1 each 0    insulin lispro (HUMALOG) 100 UNIT/ML SOLN injection vial Inject 3 Units into the skin 3 times daily (with meals) 1 each 0    hydrALAZINE (APRESOLINE) 25 MG tablet Take 1 tablet by mouth every 8 hours 90 tablet 3    potassium chloride (KLOR-CON M) 10 MEQ extended release tablet Take 1 tablet by mouth daily      cinacalcet (SENSIPAR) 30 MG tablet Take 1 tablet by mouth 2 times daily      lactobacillus (CULTURELLE) capsule Take 1 capsule by mouth daily (with breakfast) 60 capsule 0    carvedilol (COREG) 6.25 MG tablet Take 1 tablet by mouth 2 times daily (with meals) 60 tablet 3    baclofen (LIORESAL) 10 MG tablet Take 1 tablet by mouth 3 times daily as needed (PAIN) Indications: Pain FOR PAIN PER SHETH      Magnesium Oxide 400 MG CAPS Take 1 capsule by mouth daily.      aspirin 325 MG tablet Take 1 tablet by mouth daily      acetaminophen (TYLENOL) 500 MG  tablet Take 1 tablet by mouth every 6 hours as needed for Fever or Pain 30 tablet 0    atorvastatin (LIPITOR) 80 MG tablet Take 1 tablet by mouth nightly 30 tablet 3    ferrous sulfate (IRON 325) 325 (65 Fe) MG tablet Take 1 tablet by mouth daily 30 tablet     Multiple Vitamins-Minerals (THERAPEUTIC MULTIVITAMIN-MINERALS) tablet Take 1 tablet by mouth daily      furosemide (LASIX) 40 MG tablet furosemide 40 mg tablet   TAKE 1 TABLET BY MOUTH EVERY DAY             Past History     Past Medical History:  Past Medical History:   Diagnosis Date    Anemia     Arthritis     Chronic pain     Legs and Shoulders    Diabetes (HCC)     Exposure to asbestos     History of blood transfusion     History of colon polyps     HTN (hypertension)     Hx of blood clots     DVT    Hyperlipemia     Menopause     Pulmonary nodule     Serum calcium elevated      Sleep apnea     not using cpap    Stroke [facility])        Past Surgical History:  Past Surgical History:   Procedure Laterality Date    BACK SURGERY N/A 06/24/2022    INCISION AND DRAINAGE /DEBRIDEMENT SACRAL DECUBITUS performed by Nestor Ramp, DO at [place]    COLONOSCOPY N/A 01/13/2017    COLONOSCOPY performed by Joycelyn Schmid, MD at [place] N/A 12/28/2021    SACRAL WOUND DEBRIDEMENT INCISION AND DRAINAGE performed by Nestor Ramp, DO at [place] N/A 12/09/2021    CERVICAL THREE/FOUR/FIVE/SIX LAMINECTOMY FUSION; C-ARM; STRYKER; EXT BONE GROWTH STIM; 23 HR performed by Candi Leash, MD at [facility] MAIN OR       Family History:  Family History   Problem Relation Age of Onset    Diabetes Other 69        parent, NOS    Cancer Father     Hypertension Other 35        parent,NOS    Heart Disease Other 72        parent, NOS    Lung Disease Father        Social History:  Social History     Tobacco Use    Smoking status: Former     Current packs/day: 0.00     Types: Cigarettes, Cigars     Quit date: 11/13/2021     Years since quitting: 0.8    Smokeless tobacco: Never   Vaping Use    Vaping Use: Never used   Substance Use Topics    Alcohol use: Not Currently     Comment: occ    Drug use: Never       Allergies:  Allergies   Allergen Reactions    Lactose Other (See Comments)     Intolerance         Review of Systems   Review of Systems   Constitutional:  Negative for appetite change, fatigue and fever.   Respiratory:  Negative  for cough, chest tightness and shortness of breath.    Cardiovascular:  Negative for chest pain, palpitations and leg swelling.   Gastrointestinal:  Negative for abdominal pain, diarrhea, nausea and vomiting.   Genitourinary:  Negative for difficulty urinating, dysuria, flank pain and frequency.   Musculoskeletal:  Negative for back pain, neck pain and neck stiffness.    Skin:  Positive for wound. Negative for rash.   Neurological:  Negative for dizziness, facial asymmetry, weakness, light-headedness, numbness and headaches.   Hematological:  Negative for adenopathy. Does not bruise/bleed easily.   Psychiatric/Behavioral:  Negative for agitation, behavioral problems and confusion.          Physical Exam   Physical Exam  Vitals and nursing note reviewed.   Constitutional:       General: She is not in acute distress.     Appearance: Normal appearance. She is not ill-appearing, toxic-appearing or diaphoretic.   HENT:      Head: Normocephalic and atraumatic.   Cardiovascular:      Rate and Rhythm: Normal rate and regular rhythm.   Pulmonary:      Effort: Pulmonary effort is normal.      Breath sounds: Normal breath sounds.   Musculoskeletal:         General: Normal range of motion.      Cervical back: Normal range of motion and neck supple.   Skin:     General: Skin is warm.      Findings: No rash.      Comments: Sacral decubitus ulcer no induration clean wound edges, well dressed with ointment on arrival.  Stage I    Right buttock with firm area within the gluteus superior and lateral of sacral wound, muscle spasm versus swelling.  This is asymmetric from the left.  No erythema or tenderness with palpation.   Neurological:      General: No focal deficit present.      Mental Status: She is alert and oriented to person, place, and time.      Cranial Nerves: No cranial nerve deficit.      Sensory: No sensory deficit.      Motor: No weakness.        Vitals:    10/05/22 0318   BP: (!) 154/101   Pulse: 87   Resp: 20   Temp:    SpO2: 99%         Diagnostic Study Results     Labs -     Recent Results (from the past 12 hour(s))   Urinalysis    Collection Time: 10/05/22 12:38 AM   Result Value Ref Range    Color, UA YELLOW      Appearance CLEAR      Specific Gravity, UA 1.012 1.005 - 1.030      pH, Urine 6.5 5.0 - 8.0      Protein, UA TRACE (A) NEG mg/dL    Glucose, UA Negative NEG mg/dL     Ketones, Urine Negative NEG mg/dL    Bilirubin Urine Negative NEG      Blood, Urine Negative NEG      Urobilinogen, Urine 0.2 0.2 - 1.0 EU/dL    Nitrite, Urine Negative NEG      Leukocyte Esterase, Urine Negative NEG     Wet prep, genital    Collection Time: 10/05/22 12:38 AM    Specimen: Vaginal   Result Value Ref Range    Special Requests NO SPECIAL REQUESTS  Wet Prep NO YEAST,TRICHOMONAS OR CLUE CELLS NOTED     Urinalysis, Micro    Collection Time: 10/05/22 12:38 AM   Result Value Ref Range    WBC, UA Negative 0 - 4 /hpf    RBC, UA Negative 0 - 5 /hpf    Epithelial Cells UA 1+ 0 - 5 /lpf    BACTERIA, URINE FEW (A) NEG /hpf   CBC with Auto Differential    Collection Time: 10/05/22  2:49 AM   Result Value Ref Range    WBC 4.5 (L) 4.6 - 13.2 K/uL    RBC 4.71 4.20 - 5.30 M/uL    Hemoglobin 11.6 (L) 12.0 - 16.0 g/dL    Hematocrit 35.7 35.0 - 45.0 %    MCV 75.8 (L) 78.0 - 100.0 FL    MCH 24.6 24.0 - 34.0 PG    MCHC 32.5 31.0 - 37.0 g/dL    RDW 14.4 11.6 - 14.5 %    Platelets 169 135 - 420 K/uL    MPV 10.5 9.2 - 11.8 FL    Nucleated RBCs 0.0 0 PER 100 WBC    nRBC 0.00 0.00 - 0.01 K/uL    Neutrophils % 62 40 - 73 %    Lymphocytes % 23 21 - 52 %    Monocytes % 11 (H) 3 - 10 %    Eosinophils % 4 0 - 5 %    Basophils % 0 0 - 2 %    Immature Granulocytes 0 0.0 - 0.5 %    Neutrophils Absolute 2.8 1.8 - 8.0 K/UL    Lymphocytes Absolute 1.0 0.9 - 3.6 K/UL    Monocytes Absolute 0.5 0.05 - 1.2 K/UL    Eosinophils Absolute 0.2 0.0 - 0.4 K/UL    Basophils Absolute 0.0 0.0 - 0.1 K/UL    Absolute Immature Granulocyte 0.0 0.00 - 0.04 K/UL    Differential Type AUTOMATED     Comprehensive Metabolic Panel    Collection Time: 10/05/22  2:49 AM   Result Value Ref Range    Sodium 139 136 - 145 mmol/L    Potassium 3.9 3.5 - 5.5 mmol/L    Chloride 107 100 - 111 mmol/L    CO2 29 21 - 32 mmol/L    Anion Gap 3 3.0 - 18 mmol/L    Glucose 172 (H) 74 - 99 mg/dL    BUN 28 (H) 7.0 - 18 MG/DL    Creatinine 0.75 0.6 - 1.3 MG/DL    Bun/Cre Ratio 37  (H) 12 - 20      Est, Glom Filt Rate >60 >60 ml/min/1.13m    Calcium 9.6 8.5 - 10.1 MG/DL    Total Bilirubin 0.2 0.2 - 1.0 MG/DL    ALT 92 (H) 13 - 56 U/L    AST 41 (H) 10 - 38 U/L    Alk Phosphatase 161 (H) 45 - 117 U/L    Total Protein 6.7 6.4 - 8.2 g/dL    Albumin 3.3 (L) 3.4 - 5.0 g/dL    Globulin 3.4 2.0 - 4.0 g/dL    Albumin/Globulin Ratio 1.0 0.8 - 1.7           Radiologic Studies -   Non-plain film images such as CT, Ultrasound and MRI are read by the radiologist. Plain radiographic images are visualized and preliminarily interpreted by me with findings noted in MDM section. In evenings and at night radiology interpretations are not routinely available.     Interpretation per the  Radiologist below, if available at the time of this note:    CT ABDOMEN PELVIS W IV CONTRAST Additional Contrast? None    (Results Pending)       SCREENINGS                 Medical Decision Making   I am the first provider for this patient.    I reviewed the vital signs, available nursing notes, past medical history, past surgical history, family history and social history.      Provider Notes (Medical Decision Making):     Gluteal swelling: Concern for infection given history of sacral osteomyelitis and sepsis.  Normal vital signs, white blood cell count 4.5.  Sepsis unlikely at this point however lactate and procalcitonin, CT abdomen pending.    3:33 AM   Pt care transferred to Dr. Trudee Kuster ED provider. Pt complaint(s), current treatment plan, progression and available diagnostic results have been discussed thoroughly. The patient was seen and evaluated on my shift, but please refer to their note for assessment and disposition. Patient was informed of changing providers.  Rounding occurred: yes  Intended Disposition: home  Pending diagnostic reports and/or labs (please list): CT, lactate, CMP        Is this patient to be included in the SEP-1 core measure due to severe sepsis or septic shock? No Exclusion criteria - the patient  is NOT to be included for SEP-1 Core Measure due to: Infection is not suspected      ED Course:        1. Skin ulcer of sacrum, limited to breakdown of skin ([place])        DISPOSITION        Orders Placed This Encounter   Medications    iopamidol (ISOVUE-300) 61 % injection 100 mL          Medication List        ASK your doctor about these medications      acetaminophen 500 MG tablet  Commonly known as: TYLENOL  Take 1 tablet by mouth every 6 hours as needed for Fever or Pain     aspirin 325 MG tablet     atorvastatin 80 MG tablet  Commonly known as: LIPITOR  Take 1 tablet by mouth nightly     baclofen 10 MG tablet  Commonly known as: LIORESAL     carvedilol 6.25 MG tablet  Commonly known as: COREG  Take 1 tablet by mouth 2 times daily (with meals)     cinacalcet 30 MG tablet  Commonly known as: SENSIPAR     ferrous sulfate 325 (65 Fe) MG tablet  Commonly known as: IRON 325  Take 1 tablet by mouth daily     fluconazole 100 MG tablet  Commonly known as: DIFLUCAN     furosemide 40 MG tablet  Commonly known as: LASIX     hydrALAZINE 25 MG tablet  Commonly known as: APRESOLINE  Take 1 tablet by mouth every 8 hours     insulin glargine 100 UNIT/ML injection vial  Commonly known as: LANTUS  Inject 10 Units into the skin daily     insulin lispro 100 UNIT/ML Soln injection vial  Commonly known as: HUMALOG  Inject 3 Units into the skin 3 times daily (with meals)     lactobacillus capsule  Take 1 capsule by mouth daily (with breakfast)     Magnesium Oxide 400 MG Caps     nystatin 100000 UNIT/GM powder  Commonly known as:  MYCOSTATIN  Apply 3 times daily.     potassium chloride 10 MEQ extended release tablet  Commonly known as: KLOR-CON M     therapeutic multivitamin-minerals tablet  Take 1 tablet by mouth daily              Follow-ups:  No follow-up provider specified.          Ellis Parents, [place]  10/05/22 [phone]

## 2022-10-05 ENCOUNTER — Encounter: Payer: PRIVATE HEALTH INSURANCE | Primary: Physician Assistant

## 2022-10-05 ENCOUNTER — Emergency Department: Admit: 2022-10-05 | Payer: PRIVATE HEALTH INSURANCE | Primary: Physician Assistant

## 2022-10-05 ENCOUNTER — Inpatient Hospital Stay
Admit: 2022-10-05 | Discharge: 2022-10-05 | Disposition: A | Discharge status: Home / Self Care | Payer: PRIVATE HEALTH INSURANCE

## 2022-10-05 LAB — CBC WITH AUTO DIFFERENTIAL
Basophils %: 0 % (ref 0–2)
Basophils Absolute: 0 10*3/uL (ref 0.0–0.1)
Eosinophils %: 4 % (ref 0–5)
Eosinophils Absolute: 0.2 10*3/uL (ref 0.0–0.4)
Hematocrit: 35.7 % (ref 35.0–45.0)
Hemoglobin: 11.6 g/dL — ABNORMAL LOW (ref 12.0–16.0)
Immature Granulocytes %: 0 % (ref 0.0–0.5)
Immature Granulocytes Absolute: 0 10*3/uL (ref 0.00–0.04)
Lymphocytes %: 23 % (ref 21–52)
Lymphocytes Absolute: 1 10*3/uL (ref 0.9–3.6)
MCH: 24.6 pg (ref 24.0–34.0)
MCHC: 32.5 g/dL (ref 31.0–37.0)
MCV: 75.8 FL — ABNORMAL LOW (ref 78.0–100.0)
MPV: 10.5 FL (ref 9.2–11.8)
Monocytes %: 11 % — ABNORMAL HIGH (ref 3–10)
Monocytes Absolute: 0.5 10*3/uL (ref 0.05–1.2)
Neutrophils %: 62 % (ref 40–73)
Neutrophils Absolute: 2.8 10*3/uL (ref 1.8–8.0)
Nucleated RBCs: 0 /100{WBCs}
Platelets: 169 10*3/uL (ref 135–420)
RBC: 4.71 M/uL (ref 4.20–5.30)
RDW: 14.4 % (ref 11.6–14.5)
WBC: 4.5 10*3/uL — ABNORMAL LOW (ref 4.6–13.2)
nRBC: 0 10*3/uL (ref 0.00–0.01)

## 2022-10-05 LAB — LACTATE, SEPSIS: Lactic Acid, Sepsis: 0.6 MMOL/L (ref 0.4–2.0)

## 2022-10-05 LAB — COMPREHENSIVE METABOLIC PANEL
ALT: 92 U/L — ABNORMAL HIGH (ref 13–56)
AST: 41 U/L — ABNORMAL HIGH (ref 10–38)
Albumin/Globulin Ratio: 1 (ref 0.8–1.7)
Albumin: 3.3 g/dL — ABNORMAL LOW (ref 3.4–5.0)
Alk Phosphatase: 161 U/L — ABNORMAL HIGH (ref 45–117)
Anion Gap: 3 mmol/L (ref 3.0–18)
BUN/Creatinine Ratio: 37 — ABNORMAL HIGH (ref 12–20)
BUN: 28 mg/dL — ABNORMAL HIGH (ref 7.0–18)
CO2: 29 mmol/L (ref 21–32)
Calcium: 9.6 mg/dL (ref 8.5–10.1)
Chloride: 107 mmol/L (ref 100–111)
Creatinine: 0.75 mg/dL (ref 0.6–1.3)
Est, Glom Filt Rate: >60 mL/min/{1.73_m2} (ref >60)
Globulin: 3.4 g/dL (ref 2.0–4.0)
Glucose: 172 mg/dL — ABNORMAL HIGH (ref 74–99)
Potassium: 3.9 mmol/L (ref 3.5–5.5)
Sodium: 139 mmol/L (ref 136–145)
Total Bilirubin: 0.2 mg/dL (ref 0.2–1.0)
Total Protein: 6.7 g/dL (ref 6.4–8.2)

## 2022-10-05 LAB — URINALYSIS
Bilirubin Urine: NEGATIVE
Blood, Urine: NEGATIVE
Glucose, UA: NEGATIVE mg/dL
Ketones, Urine: NEGATIVE mg/dL
Leukocyte Esterase, Urine: NEGATIVE
Nitrite, Urine: NEGATIVE
Specific Gravity, UA: 1.012 (ref 1.005–1.030)
Urobilinogen, Urine: 0.2 EU/dL (ref 0.2–1.0)
pH, Urine: 6.5 (ref 5.0–8.0)

## 2022-10-05 LAB — PROCALCITONIN: Procalcitonin: <0.05 ng/mL

## 2022-10-05 LAB — WET PREP, GENITAL

## 2022-10-05 LAB — URINALYSIS, MICRO
RBC, UA: NEGATIVE /HPF (ref 0–5)
WBC, UA: NEGATIVE /HPF (ref 0–4)

## 2022-10-05 LAB — MAGNESIUM: Magnesium: 1.2 mg/dL — ABNORMAL LOW (ref 1.6–2.6)

## 2022-10-05 MED ORDER — IOPAMIDOL 61 % IV SOLN
61 % | Freq: Once | INTRAVENOUS | Status: AC | PRN
Start: 2022-10-05 — End: 2022-10-05
  Administered 2022-10-05: 09:00:00 100 mL via INTRAVENOUS

## 2022-10-05 MED FILL — ISOVUE-300 61 % IV SOLN: 61 % | INTRAVENOUS | Qty: 100

## 2022-10-07 ENCOUNTER — Encounter: Admit: 2022-10-07 | Discharge: 2022-10-07 | Payer: PRIVATE HEALTH INSURANCE | Primary: Physician Assistant

## 2022-10-07 NOTE — Home Health (Signed)
Skilled reason for visit: Wound care    Caregiver involvement: Pt resides in a single story home with her daughter, pt amb with use of a walker and requires asst with all ADL's and medications, a PCA comes in during the week intermittently when the agency has someone available and the daughter is the primary CG and provides transportation to appts and is available to asst as needed.    This visit: Pt is A&Ox4, able to communicate her needs, pt continues to deny any pain or discomfort, small area to pts sacrum remains stable, no drainage noted, small amt of pink still seen, tx in progress continues, SN continues to educate pt and CG on risks for pressure injury such as sedentary lifestyle,  immobility, moisture, friction, shear, incontinence, poor hygiene, age, or conditions that affect blood flow to the extremities. SN instructed pt/CG on prevention strategies such as frequent position changes (at least every 2 hours), proper positioning or use of pressure relieving devices to alleviate pressure points, maintenance of hygiene, checking skin regularly, and following her physician-recommended diet, both pt and CG acknowledged understanding.    Home health supplies by type and quantity ordered/delivered this visit include: N/A    Medications reviewed and all medications are available in the home this visit.    MD notified of any discrepancies/look a-like medications/medication interactions: NA    Medications are effective at this time.     Patient education provided this visit: SEE INTERVENTIONS LIST    Pt advised not to start, stop or change the way she is currently taking any of her medications without first consulting with the MD.    SN instructed pt/CG to promptly report any redness, swelling, warmth, fever, chills, increased heart/respiratory rate, uncontrolled pain/tenderness, drainage: green/yellow/brown, or odor to MD immediately.     SN advised CG during shower/bath time to assess pts skin for evidence of  pressure injury such as: non-blanchable erythema, blistering, discoloration, alteration of oral mucosa: signs of mucositis, yeast infection, or breaks in membrane, edema, rash and skin tears. Report location, size, and severity of new or worsening findings to physician.    Agency Progress toward goals: progressing well    Patient's Progress towards personal goals: progressing well    Pt acknowledged understanding of all education provided.    Patient's Progress towards personal goals: when patient reaches goals and medication is managed, disease processes are understood patient agrees and understand that discharge will take place.

## 2022-10-08 ENCOUNTER — Encounter: Payer: PRIVATE HEALTH INSURANCE | Primary: Physician Assistant

## 2022-10-09 ENCOUNTER — Encounter: Admit: 2022-10-09 | Discharge: 2022-10-09 | Payer: PRIVATE HEALTH INSURANCE | Primary: Physician Assistant

## 2022-10-09 NOTE — Home Health (Signed)
 Caregiver involvement: Pt's daughter provides daily S with ADL and transfers.  She also assists with toileting hygiene as needed.    Medications reviewed and all medications are available in the home this visit.    The following education was provided regarding medications, medication interactions, and look alike medications (specify): Continue as directed by MD.    Medications  are effective at this time.      Patient education provided this visit: see d/c summary    Sharps education provided:  na    Patient level of understanding of education provided: see d/c summary    Skilled Care Performed this visit: OT discharge/reassessment of goals    Patient response to procedure performed:  [name] Short had a positive response to therapy.  She denies having pain that limits daily participation with self care and vitals were within therapy parameters.    Patient's Progress towards personal goals: all goals met    Home exercise program: B UE strengthening against gravity for shoulder flexion/extension, overhead press, chest press, ab/adduction and elbow flexion/extension    Continued need for the following skills: SN    Discharge Plans:  DC OT.  No further need for skilled OT as pt has reached maximal potential with self care and functional mobility.  MD office notified of OT d/c.

## 2022-10-10 ENCOUNTER — Encounter: Payer: PRIVATE HEALTH INSURANCE | Primary: Physician Assistant

## 2022-10-10 NOTE — Home Health (Signed)
 Skilled reason for visit: Assessment, HHC D/C  Medications reconciled  and all listed medications are available at home.  The following education was provided regarding medications, medication interactions, and look a like medications: all med's reviewed. Discussed importance of timely taking all med's, proper dosage and freq. Medications are somewhat effective at this time. No new medication added.  Caregiver: CG/daughter, PCA  who assist with daily meals, assist with ADL's, assist/administer with daily medication, run errands groceries and accompany to MD appt.prn     Patient education provided this visit to include: Reviewed infection prevention; hand washing or using hand sanitizer when touching contaminated items. Ambulate as tolerated, repositioning q 2 hrs when in bed, keeping peri-area dry, clean and continue EPC cream to sacral area. Continue ADA and heart healthy diet,  avoid skipping meals and good hydration to prevent constipation. Monitor daily BG before meals. Continue to monitor for S/S of infection; fever 100.4, increase pain, redness, increased swelling, coughing with yellow thick sputum, cloudy urine with strong odor,  not feeling well 2-3 days, SOB, and to call [facility] or MD for assistance if experiencing any of these S/S. To call 911 with chest pains, facial drooping, difficulty talking, non arousable/unconscious and uncontrollable bleeding.  Sharps education provided: Completed teaching.  Patient/caregiver degree of understanding: Pt/CG has good understanding of the teaching provided during visit.    Skilled care provided: Completed assessment, sacral wound completely closed, no redness or swelling. Discussed no further skilled care needed and completed final D/C instructions. Pt to f/u scheduled MD appt. 10/22/2022 1:45 PM with Drena Gentile, DO .   Pt. tolerated well during the assessment & procedure with no C/O.   Home health supplies by type and quantity ordered/delivered this visit include:  BN/A (has supplies at home)    Agency Progress toward goals: Goals met.  Patient's Progress towards personal goals:  Goals met.     Home exercise program/Homework provided: Ambulation, HEP deep breathing exercises 10x when having SOB, pain and anxiety.    Pt/CG able to  continue to manage disease and medication independently, pt reach her maximum level of function, sacral wound is healed and health condition stable.

## 2022-10-12 NOTE — Case Communication (Signed)
Occupational Therapy Discharge - [name] Short was seen by OT for 4 weeks to maximize her safety and participation with self care and functional mobility.  She has been educated on use of energy conservation strategies such as sitting during functional tasks, taking rest breaks and keeping frequently used items within reach.  She has a hip kit and and has been instructed on how to use it to assist with LB ADL.  [name] Short requires s/u supervis ion for grooming, sponge bathing, UB dressing and LB dressing.  She requires Min A for toileting/bowel hygiene due to sacral wound.  Discussed use of a toileting aide once healed to maximize independence with toileting.  [name] Short performs functional transfers with S and use of a RW.  She has a 3:1 commode.  Maximal potential has been achieved with self care and functional mobility at this time.  No further OT is indicated.  MD office no tified.

## 2022-10-22 ENCOUNTER — Ambulatory Visit
Admit: 2022-10-22 | Discharge: 2022-10-22 | Payer: PRIVATE HEALTH INSURANCE | Attending: Surgery | Primary: Physician Assistant

## 2022-10-22 DIAGNOSIS — L89154 Pressure ulcer of sacral region, stage 4: Secondary | ICD-10-CM

## 2022-10-22 NOTE — Progress Notes (Signed)
CC:   Chief Complaint   Patient presents with    Follow-up     Sacral ulcer         Assessment:    ICD-10-CM    1. Sacral decubitus ulcer, stage IV (HCC)  L89.154           Plan: Her sacral wound is completely healed and there is no need for any home nursing or additional follow-up in the clinic.  She will continue to offload the area is much as possible.  She can wash and shower without any restrictions at this time.  There is no need for additional follow-up unless they have any questions or concerns in the future.  They agree with the plan.        HPI:  [name] Short is a 67 y.o. female who is here today for wound check. On 06/24/2022 debridement of stage 4 sacral decubitus with placement of wound vac. She is doing well at her facility. She denies any sacral pain at this time and last visit with home nursing was a week ago and the wound was healed. No fevers or chills. Otherwise she is without complaints.       Allergies:  Allergies   Allergen Reactions    Lactose Other (See Comments)     Intolerance       Medication Review:  Current Outpatient Medications on File Prior to Visit   Medication Sig Dispense Refill    vitamin C (ASCORBIC ACID) 500 MG tablet Take 500 mg by mouth daily.      fluconazole (DIFLUCAN) 100 MG tablet Take 100 mg by mouth daily.      nystatin (MYCOSTATIN) 100000 UNIT/GM powder Apply 3 times daily. 30 g 0    insulin glargine (LANTUS) 100 UNIT/ML injection vial Inject 10 Units into the skin daily 1 each 0    insulin lispro (HUMALOG) 100 UNIT/ML SOLN injection vial Inject 3 Units into the skin 3 times daily (with meals) 1 each 0    hydrALAZINE (APRESOLINE) 25 MG tablet Take 1 tablet by mouth every 8 hours 90 tablet 3    potassium chloride (KLOR-CON M) 10 MEQ extended release tablet Take 1 tablet by mouth daily      cinacalcet (SENSIPAR) 30 MG tablet Take 1 tablet by mouth 2 times daily      lactobacillus (CULTURELLE) capsule Take 1 capsule by mouth daily (with breakfast) 60 capsule 0    carvedilol  (COREG) 6.25 MG tablet Take 1 tablet by mouth 2 times daily (with meals) 60 tablet 3    baclofen (LIORESAL) 10 MG tablet Take 1 tablet by mouth 3 times daily as needed (PAIN) Indications: Pain FOR PAIN PER SHETH      Magnesium Oxide 400 MG CAPS Take 1 capsule by mouth daily.      aspirin 325 MG tablet Take 1 tablet by mouth daily      acetaminophen (TYLENOL) 500 MG tablet Take 1 tablet by mouth every 6 hours as needed for Fever or Pain 30 tablet 0    atorvastatin (LIPITOR) 80 MG tablet Take 1 tablet by mouth nightly 30 tablet 3    ferrous sulfate (IRON 325) 325 (65 Fe) MG tablet Take 1 tablet by mouth daily 30 tablet     Multiple Vitamins-Minerals (THERAPEUTIC MULTIVITAMIN-MINERALS) tablet Take 1 tablet by mouth daily      furosemide (LASIX) 40 MG tablet furosemide 40 mg tablet   TAKE 1 TABLET BY MOUTH EVERY DAY  No current facility-administered medications on file prior to visit.       Systems Review:  Review of Systems   Constitutional:  Negative for fatigue and fever.   Respiratory:  Negative for chest tightness and shortness of breath.    Skin:  Negative for pallor, rash and wound.   Hematological:  Negative for adenopathy. Does not bruise/bleed easily.       PMH:  Past Medical History:   Diagnosis Date    Anemia     Arthritis     Chronic pain     Legs and Shoulders    Diabetes ([place])     Exposure to asbestos     History of blood transfusion     History of colon polyps     HTN (hypertension)     Hx of blood clots     DVT    Hyperlipemia     Menopause     Pulmonary nodule     Serum calcium elevated     Sleep apnea     not using cpap    Stroke [facility])        Surgical History:  Past Surgical History:   Procedure Laterality Date    BACK SURGERY N/A 06/24/2022    INCISION AND DRAINAGE /DEBRIDEMENT SACRAL DECUBITUS performed by Nestor Ramp, DO at [place]    COLONOSCOPY N/A 01/13/2017    COLONOSCOPY performed by Joycelyn Schmid, MD at [place] N/A 12/28/2021    SACRAL WOUND DEBRIDEMENT INCISION AND DRAINAGE performed by Nestor Ramp, DO at [place] N/A 12/09/2021    CERVICAL THREE/FOUR/FIVE/SIX LAMINECTOMY FUSION; C-ARM; STRYKER; EXT BONE GROWTH STIM; 23 HR performed by Candi Leash, MD at [place]       Social History:  Social History     Socioeconomic History    Marital status: Single     Spouse name: None    Number of children: None    Years of education: None    Highest education level: None   Tobacco Use    Smoking status: Former     Current packs/day: 0.00     Types: Cigarettes, Cigars     Quit date: 11/13/2021     Years since quitting: 0.9    Smokeless tobacco: Never   Vaping Use    Vaping Use: Never used   Substance and Sexual Activity    Alcohol use: Not Currently     Comment: occ    Drug use: Never     Social Determinants of Health     Financial Resource Strain: Low Risk  (01/05/2022)    Overall Financial Resource Strain (CARDIA)     Difficulty of Paying Living Expenses: Not hard at all   Food Insecurity: Food Insecurity Present (08/24/2022)    Hunger Vital Sign     Worried About Running Out of Food in the Last Year: Sometimes true     Ran Out of Food in the Last Year: Sometimes true   Transportation Needs: No Transportation Needs (10/10/2022)    OASIS A1250: Transportation     Lack of Transportation (Medical): No     Lack of Transportation (Non-Medical): No     Patient Unable or Declines to Respond: No   Physical Activity: Inactive (01/05/2022)    Exercise Vital  Sign     Days of Exercise per Week: 0 days     Minutes of Exercise per Session: 0 min   Stress: No Stress Concern Present (01/05/2022)    [place]     Feeling of Stress : Not at all   Social Connections: Feeling Socially Integrated (10/10/2022)    OASIS D0700: Social Isolation     Frequency of experiencing loneliness or isolation: Never   Intimate Partner  Violence: Not At Risk (01/05/2022)    Humiliation, Afraid, Rape, and Kick questionnaire     Fear of Current or Ex-Partner: No     Emotionally Abused: No     Physically Abused: No     Sexually Abused: No   Housing Stability: Low Risk  (08/24/2022)    Housing Stability Vital Sign     Unable to Pay for Housing in the Last Year: No     Number of Places Lived in the Last Year: 1     Unstable Housing in the Last Year: No       Family History:  Family History   Problem Relation Age of Onset    Diabetes Other 57        parent, NOS    Cancer Father     Hypertension Other 35        parent,NOS    Heart Disease Other 35        parent, NOS    Lung Disease Father        Admission on 10/04/2022, Discharged on 10/05/2022   Component Date Value Ref Range Status    Color, UA 10/05/2022 YELLOW    Final    Appearance 10/05/2022 CLEAR    Final    Specific Gravity, UA 10/05/2022 1.012  1.005 - 1.030   Final    pH, Urine 10/05/2022 6.5  5.0 - 8.0   Final    Protein, UA 10/05/2022 TRACE (A)  NEG mg/dL Final    Glucose, UA 10/05/2022 Negative  NEG mg/dL Final    Ketones, Urine 10/05/2022 Negative  NEG mg/dL Final    Bilirubin Urine 10/05/2022 Negative  NEG   Final    Blood, Urine 10/05/2022 Negative  NEG   Final    Urobilinogen, Urine 10/05/2022 0.2  0.2 - 1.0 EU/dL Final    Nitrite, Urine 10/05/2022 Negative  NEG   Final    Leukocyte Esterase, Urine 10/05/2022 Negative  NEG   Final    Special Requests 10/05/2022 NO SPECIAL REQUESTS    Final    Wet Prep 10/05/2022 NO YEAST,TRICHOMONAS OR CLUE CELLS NOTED    Final    WBC 10/05/2022 4.5 (L)  4.6 - 13.2 K/uL Final    RBC 10/05/2022 4.71  4.20 - 5.30 M/uL Final    Hemoglobin 10/05/2022 11.6 (L)  12.0 - 16.0 g/dL Final    Hematocrit 10/05/2022 35.7  35.0 - 45.0 % Final    MCV 10/05/2022 75.8 (L)  78.0 - 100.0 FL Final    MCH 10/05/2022 24.6  24.0 - 34.0 PG Final    MCHC 10/05/2022 32.5  31.0 - 37.0 g/dL Final    RDW 10/05/2022 14.4  11.6 - 14.5 % Final    Platelets 10/05/2022 169  135 - 420 K/uL  Final    MPV 10/05/2022 10.5  9.2 - 11.8 FL Final    Nucleated RBCs 10/05/2022 0.0  0 PER 100 WBC Final    nRBC 10/05/2022 0.00  0.00 -  0.01 K/uL Final    Neutrophils % 10/05/2022 62  40 - 73 % Final    Lymphocytes % 10/05/2022 23  21 - 52 % Final    Monocytes % 10/05/2022 11 (H)  3 - 10 % Final    Eosinophils % 10/05/2022 4  0 - 5 % Final    Basophils % 10/05/2022 0  0 - 2 % Final    Immature Granulocytes 10/05/2022 0  0.0 - 0.5 % Final    Neutrophils Absolute 10/05/2022 2.8  1.8 - 8.0 K/UL Final    Lymphocytes Absolute 10/05/2022 1.0  0.9 - 3.6 K/UL Final    Monocytes Absolute 10/05/2022 0.5  0.05 - 1.2 K/UL Final    Eosinophils Absolute 10/05/2022 0.2  0.0 - 0.4 K/UL Final    Basophils Absolute 10/05/2022 0.0  0.0 - 0.1 K/UL Final    Absolute Immature Granulocyte 10/05/2022 0.0  0.00 - 0.04 K/UL Final    Differential Type 10/05/2022 AUTOMATED    Final    Sodium 10/05/2022 139  136 - 145 mmol/L Final    Potassium 10/05/2022 3.9  3.5 - 5.5 mmol/L Final    Chloride 10/05/2022 107  100 - 111 mmol/L Final    CO2 10/05/2022 29  21 - 32 mmol/L Final    Anion Gap 10/05/2022 3  3.0 - 18 mmol/L Final    Glucose 10/05/2022 172 (H)  74 - 99 mg/dL Final    BUN 10/05/2022 28 (H)  7.0 - 18 MG/DL Final    Creatinine 10/05/2022 0.75  0.6 - 1.3 MG/DL Final    Bun/Cre Ratio 10/05/2022 37 (H)  12 - 20   Final    Est, Glom Filt Rate 10/05/2022 >60  >60 ml/min/1.4m2 Final    Comment:    Pediatric calculator link: https://www.kidney.org/professionals/kdoqi/gfr_calculatorped     These results are not intended for use in patients <35 years of age.     eGFR results are calculated without a race factor using  the 2021 CKD-EPI equation. Careful clinical correlation is recommended, particularly when comparing to results calculated using previous equations.  The CKD-EPI equation is less accurate in patients with extremes of muscle mass, extra-renal metabolism of creatinine, excessive creatine ingestion, or following therapy that affects  renal tubular secretion.      Calcium 10/05/2022 9.6  8.5 - 10.1 MG/DL Final    Total Bilirubin 10/05/2022 0.2  0.2 - 1.0 MG/DL Final    ALT 10/05/2022 92 (H)  13 - 56 U/L Final    AST 10/05/2022 41 (H)  10 - 38 U/L Final    Alk Phosphatase 10/05/2022 161 (H)  45 - 117 U/L Final    Total Protein 10/05/2022 6.7  6.4 - 8.2 g/dL Final    Albumin 10/05/2022 3.3 (L)  3.4 - 5.0 g/dL Final    Globulin 10/05/2022 3.4  2.0 - 4.0 g/dL Final    Albumin/Globulin Ratio 10/05/2022 1.0  0.8 - 1.7   Final    Procalcitonin 10/05/2022 <0.05  ng/mL Final    Comment:    Suspected Sepsis:  <0.50 ng/mL     Low likelihood of sepsis.  0.50-2.00 ng/mL    Increased likelihood of sepsis. Antibiotics encouraged.  >2.00 ng/mL  High risk of sepsis/shock. Antibiotics strongly encouraged.     Suspected Lower Resp Tract Infections:  <0.24 ng/mL    Low likelihood of bacterial infection.  >0.24 ng/mL    Increased likelihood of bacterial infection. Antibiotics encouraged.     With successful antibiotic  therapy, PCT levels should decrease rapidly. (Half-life of 24 to 36 hours)     Procalcitonin values from samples collected within the first 6 hours of systemic infection may still be low. Retesting may be indicated.  Values from day 1 and day 4 can be entered into the Change in Procalcitonin Calculator (www.brahms-pct-calculator.com) to determine the patient's Mortality Risk Prognosis.     In healthy neonates, plasma Procalcitonin (PCT) concentrations increase gradually after birth, reaching peak values at about 24 hours of age then decrease to normal values below 0                           .5 ng/mL by 48-72 hours of age.      Lactic Acid, Sepsis 10/05/2022 0.6  0.4 - 2.0 MMOL/L Final    WBC, UA 10/05/2022 Negative  0 - 4 /hpf Final    RBC, UA 10/05/2022 Negative  0 - 5 /hpf Final    Epithelial Cells UA 10/05/2022 1+  0 - 5 /lpf Final    BACTERIA, URINE 10/05/2022 FEW (A)  NEG /hpf Final    Magnesium 10/05/2022 1.2 (L)  1.6 - 2.6 mg/dL Final   No  results displayed because visit has over 200 results.      Admission on 08/17/2022, Discharged on 08/17/2022   Component Date Value Ref Range Status    WBC 08/17/2022 5.5  4.6 - 13.2 K/uL Final    RBC 08/17/2022 5.19  4.20 - 5.30 M/uL Final    Hemoglobin 08/17/2022 12.7  12.0 - 16.0 g/dL Final    Hematocrit 08/17/2022 40.6  35.0 - 45.0 % Final    MCV 08/17/2022 78.2  78.0 - 100.0 FL Final    MCH 08/17/2022 24.5  24.0 - 34.0 PG Final    MCHC 08/17/2022 31.3  31.0 - 37.0 g/dL Final    RDW 08/17/2022 16.3 (H)  11.6 - 14.5 % Final    Platelets 08/17/2022 275  135 - 420 K/uL Final    MPV 08/17/2022 9.6  9.2 - 11.8 FL Final    Nucleated RBCs 08/17/2022 0.0  0 PER 100 WBC Final    nRBC 08/17/2022 0.00  0.00 - 0.01 K/uL Final    Neutrophils % 08/17/2022 67  40 - 73 % Final    Lymphocytes % 08/17/2022 17 (L)  21 - 52 % Final    Monocytes % 08/17/2022 11 (H)  3 - 10 % Final    Eosinophils % 08/17/2022 4  0 - 5 % Final    Basophils % 08/17/2022 0  0 - 2 % Final    Immature Granulocytes 08/17/2022 0  0.0 - 0.5 % Final    Neutrophils Absolute 08/17/2022 3.7  1.8 - 8.0 K/UL Final    Lymphocytes Absolute 08/17/2022 1.0  0.9 - 3.6 K/UL Final    Monocytes Absolute 08/17/2022 0.6  0.05 - 1.2 K/UL Final    Eosinophils Absolute 08/17/2022 0.2  0.0 - 0.4 K/UL Final    Basophils Absolute 08/17/2022 0.0  0.0 - 0.1 K/UL Final    Absolute Immature Granulocyte 08/17/2022 0.0  0.00 - 0.04 K/UL Final    Differential Type 08/17/2022 AUTOMATED    Final    Sodium 08/17/2022 142  136 - 145 mmol/L Final    Potassium 08/17/2022 3.4 (L)  3.5 - 5.5 mmol/L Final    Chloride 08/17/2022 108  100 - 111 mmol/L Final    CO2 08/17/2022 30  21 -  32 mmol/L Final    Anion Gap 08/17/2022 4  3.0 - 18 mmol/L Final    Glucose 08/17/2022 57 (L)  74 - 99 mg/dL Final    BUN 08/17/2022 11  7.0 - 18 MG/DL Final    Creatinine 08/17/2022 0.77  0.6 - 1.3 MG/DL Final    Bun/Cre Ratio 08/17/2022 14  12 - 20   Final    Est, Glom Filt Rate 08/17/2022 >60  >60 ml/min/1.68m2  Final    Comment:    Pediatric calculator link: https://www.kidney.org/professionals/kdoqi/gfr_calculatorped     These results are not intended for use in patients <54 years of age.     eGFR results are calculated without a race factor using  the 2021 CKD-EPI equation. Careful clinical correlation is recommended, particularly when comparing to results calculated using previous equations.  The CKD-EPI equation is less accurate in patients with extremes of muscle mass, extra-renal metabolism of creatinine, excessive creatine ingestion, or following therapy that affects renal tubular secretion.      Calcium 08/17/2022 9.6  8.5 - 10.1 MG/DL Final    Total Bilirubin 08/17/2022 0.3  0.2 - 1.0 MG/DL Final    ALT 08/17/2022 53  13 - 56 U/L Final    AST 08/17/2022 41 (H)  10 - 38 U/L Final    Alk Phosphatase 08/17/2022 155 (H)  45 - 117 U/L Final    Total Protein 08/17/2022 7.1  6.4 - 8.2 g/dL Final    Albumin 08/17/2022 3.3 (L)  3.4 - 5.0 g/dL Final    Globulin 08/17/2022 3.8  2.0 - 4.0 g/dL Final    Albumin/Globulin Ratio 08/17/2022 0.9  0.8 - 1.7   Final    Color, UA 08/17/2022 YELLOW    Final    Appearance 08/17/2022 CLEAR    Final    Specific Gravity, UA 08/17/2022 1.007  1.005 - 1.030   Final    pH, Urine 08/17/2022 7.5  5.0 - 8.0   Final    Protein, UA 08/17/2022 30 (A)  NEG mg/dL Final    Glucose, UA 08/17/2022 Negative  NEG mg/dL Final    Ketones, Urine 08/17/2022 Negative  NEG mg/dL Final    Bilirubin Urine 08/17/2022 Negative  NEG   Final    Blood, Urine 08/17/2022 TRACE (A)  NEG   Final    Urobilinogen, Urine 08/17/2022 0.2  0.2 - 1.0 EU/dL Final    Nitrite, Urine 08/17/2022 Negative  NEG   Final    Leukocyte Esterase, Urine 08/17/2022 Negative  NEG   Final    Troponin, High Sensitivity 08/17/2022 10  0 - 54 ng/L Final    Comment: A HS troponin value change of (+ or -) 50% or more below the 99th percentile, in a 1/2/3 hr interval represents a significant change. Clinical correlation is recommended.  A HS troponin  value change of (+ or -) 20% or above the 99th percentile, in a 1/2/3 hr interval represents a significant change. Clinical correlation is recommended.  99th Percentile:    Women:  0-54 ng/L                                                               Men:  0-78 ng/L      Ventricular Rate 08/17/2022 61  BPM Final  Atrial Rate 08/17/2022 61  BPM Final    P-R Interval 08/17/2022 142  ms Final    QRS Duration 08/17/2022 80  ms Final    Q-T Interval 08/17/2022 438  ms Final    QTc Calculation (Bazett) 08/17/2022 440  ms Final    P Axis 08/17/2022 57  degrees Final    R Axis 08/17/2022 -5  degrees Final    T Axis 08/17/2022 5  degrees Final    Diagnosis 08/17/2022    Final                    Value:Normal sinus rhythm  Cannot rule out Septal infarct , age undetermined  Abnormal ECG  When compared with ECG of 20-Jun-2022 17:58,  Vent. rate has decreased BY  51 BPM  Septal infarct is now present  Inverted T waves have replaced nonspecific T wave abnormality in Anterior   leads  Confirmed by Lutricia Feil, MD, [place] [phone]) on 08/18/2022 9:08:32 AM      WBC, UA 08/17/2022 NONE  0 - 4 /hpf Final    RBC, UA 08/17/2022 3 to 5  0 - 5 /hpf Final    Epithelial Cells UA 08/17/2022 FEW  0 - 5 /lpf Final    BACTERIA, URINE 08/17/2022 FEW (A)  NEG /hpf Final   Admission on 08/09/2022, Discharged on 08/10/2022   Component Date Value Ref Range Status    SARS-CoV-2 08/09/2022 POSITIVE (A)  NEGATIVE   Final    Comment: Negative results do not preclude SARS-CoV-2 infection and should not be used as the sole basis  for treatment or other patient management decisions. Negative results must be combined with  clinical observations, patient history, and epidemiological information.    Testing with the Xpert Xpress SARS-CoV-2 test is intended for use by trained operators who are  proficient in performing tests using either GeneXpert Dx, GeneXpert Infinity and/or [employer]. The Xpert Xpress SARS-CoV-2 test is only for use under the  Food and Drug  Administration's Emergency Use Authorization.        Influenza A H1 (Seasonal) PCR 08/09/2022 NEGATIVE  NEGATIVE   Final    Influenza virus B RNA 08/09/2022 NEGATIVE  NEGATIVE   Final    RSV A/B PCR 08/09/2022 NEGATIVE  NEGATIVE   Final    Potassium 08/09/2022 3.0 (L)  3.5 - 5.1 mEq/L Final    Chloride 08/09/2022 109 (H)  98 - 107 mEq/L Final    Sodium 08/09/2022 147 (H)  136 - 145 mEq/L Final    CO2 08/09/2022 31  20 - 31 mEq/L Final    Glucose 08/09/2022 232 (H)  74 - 106 mg/dl Final    BUN 08/09/2022 16  9 - 23 mg/dl Final    Creatinine 08/09/2022 0.85  0.55 - 1.02 mg/dl Final    GFR African American 08/09/2022 >60.0    Final    Comment: THE [facility] STATES THAT THE MDRD STUDY EQUATION SHOULD ONLY BE USED ON  INDIVIDUALS 18 OR OLDER. THE REPORT ALSO NOTES THAT THE MDRD STUDY EQUATION HAS NOT BEEN  VALIDATED FOR USE WITH THE ELDERLY (OVER 13 YEARS OF AGE), PREGNANT WOMEN, PATIENTS WITH SERIOUS  COMORBID CONDITIONS, OR PERSONS WITH EXTREMES OF BODY SIZE, MUSCLE MASS, OR NUTRITIONAL STATUS.  APPLICATION OF THE EQUATION TO THESE PATIENT GROUPS MAY LEAD TO ERRORS IN GFR ESTIMATION. GFR  ESTIMATING EQUATIONS HAVE POORER AGREEMENT WITH MEASURED GFR FOR ILL HOSPITALIZED PATIENTS AND  FOR PEOPLE  WITH NEAR NORMAL KIDNEY FUNCTION THAN FOR SUBJECTS IN THE MDRD STUDY. VALIDATION  STUDIES ARE IN PROGRESS TO EVALUATE THE MDRD STUDY EQUATION FOR ADDITIONAL ETHNIC GROUPS, THE  ELDERLY, VARIOUS DISEASE CONDITIONS, AND PEOPLE WITH NORMAL KIDNEY FUNCTION.    GFRA----REFERS TO AFRICAN AMERICAN  GFRO---REFERS TO OTHER RACES    REFERENCES AVAILABLE UPON REQUEST.        GFR Non-African American 08/09/2022 >60    Final    Calcium 08/09/2022 10.3  8.7 - 10.4 mg/dl Final    Anion Gap 08/09/2022 7  5 - 15 mmol/L Final    AST 08/09/2022 39.0 (H)  0.0 - 33.9 U/L Final    ALT 08/09/2022 34  10 - 49 U/L Final    Alkaline Phosphatase 08/09/2022 118 (H)  46 - 116 U/L Final    Total Bilirubin 08/09/2022 0.20  (L)  0.30 - 1.20 mg/dl Final    Total Protein 08/09/2022 6.4  5.7 - 8.2 gm/dl Final    Albumin 08/09/2022 2.7 (L)  3.4 - 5.0 gm/dl Final    BLOOD CULTURE RESULT 08/09/2022 No Growth at 5 days    Final    BLOOD CULTURE RESULT 08/09/2022 No Growth at 5 days    Final    WBC 08/09/2022 4.5  4.0 - 11.0 1000/mm3 Final    RBC 08/09/2022 4.68  3.60 - 5.20 M/uL Final    Hemoglobin 08/09/2022 11.4  11.0 - 16.0 gm/dl Final    Hematocrit 08/09/2022 36.4  35.0 - 47.0 % Final    MCV 08/09/2022 77.8 (L)  80.0 - 98.0 fL Final    MCH 08/09/2022 24.4 (L)  25.4 - 34.6 pg Final    MCHC 08/09/2022 31.3  30.0 - 36.0 gm/dl Final    Platelets 08/09/2022 240  140 - 450 1000/mm3 Final    MPV 08/09/2022 9.8  6.0 - 10.0 fL Final    RDW 08/09/2022 49.1 (H)  36.4 - 46.3   Final    Nucleated RBCs 08/09/2022 0  0 - 0   Final    Immature Granulocytes 08/09/2022 0.2  0.0 - 3.0 % Final    Comment: IG - Immature granulocytes (promyelocytes + myelocytes + metamyelocytes), their presence  indicates a left shift. An IG > 3% may predict positive blood cultures with 98% specificity.  (P<0.04) and 92% Positive Predictive Value (Ansari-Lari)1. Increased immature granulocytes  assist with the detection of infection and/or inflammation and may be present at an early stage  and are more sensitive and specific than band counts.        Neutrophils Segmented 08/09/2022 65.5 (H)  34 - 64 % Final    Lymphocytes 08/09/2022 13.3 (L)  28 - 48 % Final    Monocytes 08/09/2022 14.8 (H)  1 - 13 % Final    Eosinophils 08/09/2022 5.8 (H)  0 - 5 % Final    Basophils 08/09/2022 0.4  0 - 3 % Final    Ventricular Rate 08/09/2022 61  BPM Final    Atrial Rate 08/09/2022 61  BPM Final    P-R Interval 08/09/2022 140  ms Final    QRS Duration 08/09/2022 94  ms Final    Q-T Interval 08/09/2022 424  ms Final    QTC Calculation (Bezet) 08/09/2022 426  ms Final    Calculated P Axis 08/09/2022 74  degrees Final    Calculated R Axis 08/09/2022 40  degrees Final    Calculated T Axis  08/09/2022 61  degrees Final  DIAGNOSIS, 93000 08/09/2022    Final                    Value:Normal sinus rhythm  Nonspecific ST abnormality  Abnormal ECG  No previous ECGs available  Confirmed by Reeves Dam, M.D., K. Shahid ([id number]) on 08/10/2022 8:27:38 AM      Lactate 08/09/2022 1.0  0.5 - 2.2 mmol/L Final    Procalcitonin 08/09/2022 0.06  0.00 - 0.50 ng/ml Final    Comment: <0.5 ng/ml = LOW risk of severe sepsis and/or septic shock    <0.5 to <= 2.0 ng/mL = Sepsis is possible but interpretation should be done by taking into  account the patient history    >2 ng/mL = Represents a HIGH risk of Severe Sepsis and/or Septic Shock    Recommendation: Retest PCT (Procalcitonin) within 6-24 hours if concentrations <2 ng/mL are  obtained.      Troponin, High Sensitivity 08/09/2022 11  0 - 34 ng/L Final    Comment: Up to 50% of normal patients may have low levels of detectable troponin (within reference range)  circulating in the blood. Mild elevations in troponin that are above the reference range may  present with other cardiac and non-cardiac disease states. Two or three serial tests are  recommended to evaluate changes in circulating troponin over time.  Note: The reference range is based on the 99th percentile of female 0-53 ng/L and female 0-34 ng/L  population.           CT ABDOMEN PELVIS W IV CONTRAST Additional Contrast? None  Narrative: CT abdomen pelvis with IV contrast.    HISTORY: Sacral wound. Right buttock induration.    All CT scans at this facility are performed using dose optimization technique as  appropriate to a performed exam, to include automated exposure control,  adjustment of the mA and/or kV according to patient size (including appropriate  matching for site specific examination) or use of iterative reconstruction  technique.    Comparison June 20, 2022    Grossly stable 8 mm pulmonary nodule in the right middle lobe. However, there is  a new low-attenuation nodule in the right medial posterior lung base,  lower  lobe.    Liver, gallbladder, spleen, pancreas, adrenal glands and kidneys remain  unremarkable. No ascites or free air.    Aorta is normal in caliber with moderate calcification.    Moderate fecal retention in the colon. No obstruction or extraluminal  inflammation.    Uterus unremarkable. Bladder is distended and also unremarkable. No adenopathy.    Significant improvement with much less severe subcutaneous stranding in the  gluteal region, slightly worse on the right. No discrete abscess. Minimal  residual phlegmon or thickening at the gluteal crease, slightly eccentric to the  left. No drainable fluid collection or abscess.  No soft tissue air.  Gluteus  musculature is intact.    No acute bony abnormalities.  Impression: 1. Mild residual stranding in the posterior gluteal subcutaneous fat with no  discrete abscess or drainable collection. Mild skin thickening at the gluteal  crease. Potential residual phlegmon but no significant abscess.  2. Stable 8 mm pulmonary nodule in the right middle lobe but there is a new 10  mm nodule in the right posterior lung base. Nonemergent chest CT recommended for  further evaluation.         Physical Exam:  BP (!) 141/54   Pulse 64   Temp 97.5 F (36.4 C)   Resp 18  Ht 1.499 m (4\' 11" )   Wt 66.3 kg (146 lb 3.2 oz)   BMI 29.53 kg/m  BMI: Body mass index is 29.53 kg/m.    Physical Exam  Constitutional:       Appearance: Normal appearance. She is normal weight.   HENT:      Head: Normocephalic.   Cardiovascular:      Rate and Rhythm: Normal rate.   Skin:     Comments: Completely healed stage 4 sacral wound, no pain   Neurological:      General: No focal deficit present.      Mental Status: She is alert and oriented to person, place, and time. Mental status is at baseline.   Psychiatric:         Mood and Affect: Mood normal.         Behavior: Behavior normal.         Thought Content: Thought content normal.         I have reviewed the information entered by the  clinical staff and/or patient and verified it as accurate or edited where necessary.     Electronically signed by:    Nestor Ramp, DO, MPH

## 2022-10-22 NOTE — Progress Notes (Signed)
[name] Short is a 67 y.o. female (DOB: July 26, 1956)     Chief Complaint   Patient presents with    Follow-up     Sacral ulcer        Medication list and allergies have been reviewed with [name] Short and updated as of today's date.     I have gone over all Medical, Surgical and Social History with [name] Short and updated/added the information accordingly.      1. Have you been to the ER, urgent care clinic since your last visit?  Hospitalized since your last visit?No    2. Have you seen or consulted any other health care providers outside of the [facility] since your last visit?  Include any pap smears or colon screening. No

## 2022-11-05 ENCOUNTER — Encounter: Payer: Self-pay | Admitting: Podiatry

## 2022-11-05 ENCOUNTER — Ambulatory Visit (INDEPENDENT_AMBULATORY_CARE_PROVIDER_SITE_OTHER): Payer: Medicare Other | Admitting: Podiatry

## 2022-11-05 DIAGNOSIS — M79674 Pain in right toe(s): Secondary | ICD-10-CM | POA: Diagnosis not present

## 2022-11-05 DIAGNOSIS — L84 Corns and callosities: Secondary | ICD-10-CM

## 2022-11-05 DIAGNOSIS — M79675 Pain in left toe(s): Secondary | ICD-10-CM

## 2022-11-05 DIAGNOSIS — E1142 Type 2 diabetes mellitus with diabetic polyneuropathy: Secondary | ICD-10-CM

## 2022-11-05 DIAGNOSIS — B351 Tinea unguium: Secondary | ICD-10-CM

## 2022-11-06 ENCOUNTER — Emergency Department: Admit: 2022-11-06 | Payer: PRIVATE HEALTH INSURANCE | Primary: Physician Assistant

## 2022-11-06 ENCOUNTER — Emergency Department: Admit: 2022-11-06 | Discharge: 2022-11-11 | Payer: MEDICAID | Primary: Physician Assistant

## 2022-11-06 ENCOUNTER — Inpatient Hospital Stay: Admit: 2022-11-06 | Discharge: 2022-11-07 | Disposition: A | Discharge status: Home / Self Care | Payer: MEDICAID

## 2022-11-06 DIAGNOSIS — N3000 Acute cystitis without hematuria: Secondary | ICD-10-CM

## 2022-11-06 LAB — COMPREHENSIVE METABOLIC PANEL
ALT: 149 U/L — ABNORMAL HIGH (ref 13–56)
AST: 77 U/L — ABNORMAL HIGH (ref 10–38)
Albumin/Globulin Ratio: 1 (ref 0.8–1.7)
Albumin: 3.6 g/dL (ref 3.4–5.0)
Alk Phosphatase: 170 U/L — ABNORMAL HIGH (ref 45–117)
Anion Gap: 3 mmol/L (ref 3.0–18)
BUN/Creatinine Ratio: 28 — ABNORMAL HIGH (ref 12–20)
BUN: 25 mg/dL — ABNORMAL HIGH (ref 7.0–18)
CO2: 32 mmol/L (ref 21–32)
Calcium: 9.6 mg/dL (ref 8.5–10.1)
Chloride: 104 mmol/L (ref 100–111)
Creatinine: 0.9 mg/dL (ref 0.6–1.3)
Est, Glom Filt Rate: 71 mL/min/{1.73_m2} (ref >60)
Globulin: 3.6 g/dL (ref 2.0–4.0)
Glucose: 161 mg/dL — ABNORMAL HIGH (ref 74–99)
Potassium: 4.4 mmol/L (ref 3.5–5.5)
Sodium: 139 mmol/L (ref 136–145)
Total Bilirubin: 0.3 mg/dL (ref 0.2–1.0)
Total Protein: 7.2 g/dL (ref 6.4–8.2)

## 2022-11-06 LAB — URINALYSIS
Bilirubin Urine: NEGATIVE
Blood, Urine: NEGATIVE
Glucose, UA: NEGATIVE mg/dL
Ketones, Urine: NEGATIVE mg/dL
Nitrite, Urine: POSITIVE — AB
Protein, UA: NEGATIVE mg/dL
Specific Gravity, UA: 1.007 (ref 1.005–1.030)
Urobilinogen, Urine: 0.2 EU/dL (ref 0.2–1.0)
pH, Urine: 6.5 (ref 5.0–8.0)

## 2022-11-06 LAB — CBC WITH AUTO DIFFERENTIAL
Basophils %: 0 % (ref 0–2)
Basophils Absolute: 0 10*3/uL (ref 0.0–0.1)
Eosinophils %: 3 % (ref 0–5)
Eosinophils Absolute: 0.2 10*3/uL (ref 0.0–0.4)
Hematocrit: 38.3 % (ref 35.0–45.0)
Hemoglobin: 12 g/dL (ref 12.0–16.0)
Immature Granulocytes %: 0 % (ref 0.0–0.5)
Immature Granulocytes Absolute: 0 10*3/uL (ref 0.00–0.04)
Lymphocytes %: 17 % — ABNORMAL LOW (ref 21–52)
Lymphocytes Absolute: 0.9 10*3/uL (ref 0.9–3.6)
MCH: 24.4 pg (ref 24.0–34.0)
MCHC: 31.3 g/dL (ref 31.0–37.0)
MCV: 77.8 FL — ABNORMAL LOW (ref 78.0–100.0)
MPV: 10.6 FL (ref 9.2–11.8)
Monocytes %: 12 % — ABNORMAL HIGH (ref 3–10)
Monocytes Absolute: 0.6 10*3/uL (ref 0.05–1.2)
Neutrophils %: 67 % (ref 40–73)
Neutrophils Absolute: 3.4 10*3/uL (ref 1.8–8.0)
Nucleated RBCs: 0 /100{WBCs}
Platelets: 216 10*3/uL (ref 135–420)
RBC: 4.92 M/uL (ref 4.20–5.30)
RDW: 15.3 % — ABNORMAL HIGH (ref 11.6–14.5)
WBC: 5 10*3/uL (ref 4.6–13.2)
nRBC: 0 10*3/uL (ref 0.00–0.01)

## 2022-11-06 LAB — URINALYSIS, MICRO
RBC, UA: 1 /HPF (ref 0–5)
WBC, UA: 10 /HPF (ref 0–4)

## 2022-11-06 LAB — BRAIN NATRIURETIC PEPTIDE: NT Pro-BNP: 337 pg/mL (ref 0–900)

## 2022-11-06 MED ORDER — CEFTRIAXONE SODIUM 1 G IJ SOLR
1 g | Freq: Once | INTRAMUSCULAR | Status: AC
Start: 2022-11-06 — End: 2022-11-06
  Administered 2022-11-06: 22:00:00 1000 mg via INTRAVENOUS

## 2022-11-06 MED ORDER — CEPHALEXIN 500 MG PO CAPS
500 | ORAL_CAPSULE | Freq: Two times a day (BID) | ORAL | 0 refills | Status: AC
Start: 2022-11-06 — End: 2022-11-13

## 2022-11-06 MED FILL — CEFTRIAXONE SODIUM 1 G IJ SOLR: 1 g | INTRAMUSCULAR | Qty: 1000

## 2022-11-06 NOTE — ED Provider Notes (Cosign Needed)
EMERGENCY DEPARTMENT HISTORY AND PHYSICAL EXAM    8:28 PM      Date: 11/06/2022  Patient Name: [name] Short    History of Presenting Illness     Chief Complaint   Patient presents with    Leg Swelling    Dysuria         History Provided By: the patient.     Additional History (Context): [name] Short is a 67 y.o. female with a history of anemia, arthritis, diabetes mellitus, DVT, and stroke presenting to the emergency department due to increasing bilateral lower extremity edema and foul-smelling odor.  Patient is accompanied by her daughter who helps take care of her.  Daughter reports that over the last 3 weeks she has noticed an increase in the swelling in the the patient's legs.  Reports that she has not been wearing the compression stockings like she is supposed to.  States that she has been compliant with her Lasix.  Patient denies any pain in the legs.  Denies chest pain or shortness of breath.  Patient's daughter also reports for the last week that she has noticed a foul odor to the urine.  Patient denies any dysuria, but admits to increased frequency.  Denies abdominal pain, nausea, vomiting, diarrhea, constipation.    PCP: Pearlean Brownie D, PA    No current facility-administered medications for this encounter.     Current Outpatient Medications   Medication Sig Dispense Refill    cephALEXin (KEFLEX) 500 MG capsule Take 1 capsule by mouth 2 times daily for 7 days 14 capsule 0    vitamin C (ASCORBIC ACID) 500 MG tablet Take 500 mg by mouth daily.      fluconazole (DIFLUCAN) 100 MG tablet Take 100 mg by mouth daily.      nystatin (MYCOSTATIN) 100000 UNIT/GM powder Apply 3 times daily. 30 g 0    insulin glargine (LANTUS) 100 UNIT/ML injection vial Inject 10 Units into the skin daily 1 each 0    insulin lispro (HUMALOG) 100 UNIT/ML SOLN injection vial Inject 3 Units into the skin 3 times daily (with meals) 1 each 0    hydrALAZINE (APRESOLINE) 25 MG tablet Take 1 tablet by mouth every 8 hours 90 tablet 3    potassium  chloride (KLOR-CON M) 10 MEQ extended release tablet Take 1 tablet by mouth daily      cinacalcet (SENSIPAR) 30 MG tablet Take 1 tablet by mouth 2 times daily      lactobacillus (CULTURELLE) capsule Take 1 capsule by mouth daily (with breakfast) 60 capsule 0    carvedilol (COREG) 6.25 MG tablet Take 1 tablet by mouth 2 times daily (with meals) 60 tablet 3    baclofen (LIORESAL) 10 MG tablet Take 1 tablet by mouth 3 times daily as needed (PAIN) Indications: Pain FOR PAIN PER SHETH      Magnesium Oxide 400 MG CAPS Take 1 capsule by mouth daily.      aspirin 325 MG tablet Take 1 tablet by mouth daily      acetaminophen (TYLENOL) 500 MG tablet Take 1 tablet by mouth every 6 hours as needed for Fever or Pain 30 tablet 0    atorvastatin (LIPITOR) 80 MG tablet Take 1 tablet by mouth nightly 30 tablet 3    ferrous sulfate (IRON 325) 325 (65 Fe) MG tablet Take 1 tablet by mouth daily 30 tablet     Multiple Vitamins-Minerals (THERAPEUTIC MULTIVITAMIN-MINERALS) tablet Take 1 tablet by mouth daily  furosemide (LASIX) 40 MG tablet furosemide 40 mg tablet   TAKE 1 TABLET BY MOUTH EVERY DAY         Past History     Past Medical History:  Past Medical History:   Diagnosis Date    Anemia     Arthritis     Chronic pain     Legs and Shoulders    Diabetes (HCC)     Exposure to asbestos     History of blood transfusion     History of colon polyps     HTN (hypertension)     Hx of blood clots     DVT    Hyperlipemia     Menopause     Pulmonary nodule     Serum calcium elevated     Sleep apnea     not using cpap    Stroke [facility])        Past Surgical History:  Past Surgical History:   Procedure Laterality Date    BACK SURGERY N/A 06/24/2022    INCISION AND DRAINAGE /DEBRIDEMENT SACRAL DECUBITUS performed by Phineas Semen, DO at [facility] MAIN OR    CESAREAN SECTION  1987    COLONOSCOPY N/A 01/13/2017    COLONOSCOPY performed by Levan Hurst, MD at [facility] ENDOSCOPY    COLONOSCOPY      DILATION AND CURETTAGE OF UTERUS  1995    RECTAL SURGERY  N/A 12/28/2021    SACRAL WOUND DEBRIDEMENT INCISION AND DRAINAGE performed by Phineas Semen, DO at [facility] MAIN OR    SPINE SURGERY N/A 12/09/2021    CERVICAL THREE/FOUR/FIVE/SIX LAMINECTOMY FUSION; C-ARM; STRYKER; EXT BONE GROWTH STIM; 23 HR performed by Pamalee Leyden, MD at [facility] MAIN OR       Family History:  Family History   Problem Relation Age of Onset    Diabetes Other 66        parent, NOS    Cancer Father     Hypertension Other 35        parent,NOS    Heart Disease Other 52        parent, NOS    Lung Disease Father        Social History:  Social History     Tobacco Use    Smoking status: Former     Current packs/day: 0.00     Types: Cigarettes, Cigars     Quit date: 11/13/2021     Years since quitting: 0.9    Smokeless tobacco: Never   Vaping Use    Vaping Use: Never used   Substance Use Topics    Alcohol use: Not Currently     Comment: occ    Drug use: Never       Allergies:  Allergies   Allergen Reactions    Lactose Other (See Comments)     Intolerance         Review of Systems       Review of Systems   Constitutional:  Negative for chills and fever.   Respiratory:  Negative for cough, chest tightness and shortness of breath.    Cardiovascular:  Positive for leg swelling. Negative for chest pain.   Gastrointestinal:  Negative for abdominal pain, constipation, diarrhea, nausea and vomiting.   Genitourinary:  Positive for frequency. Negative for dysuria.   Skin:  Negative for rash and wound.   Neurological:  Negative for dizziness, weakness, light-headedness, numbness and headaches.         Physical Exam  BP (!) 158/67   Pulse 65   Temp 97.5 F (36.4 C) (Oral)   Resp 16   Ht 1.499 m (4\' 11" )   Wt 66.2 kg (146 lb)   SpO2 97%   BMI 29.49 kg/m       Physical Exam  Vitals and nursing note reviewed.   Constitutional:       General: She is awake. She is not in acute distress.     Appearance: Normal appearance. She is well-developed, well-groomed and normal weight. She is not ill-appearing.   HENT:      Head:  Normocephalic and atraumatic.   Cardiovascular:      Rate and Rhythm: Normal rate and regular rhythm.      Pulses: Normal pulses.      Heart sounds: Normal heart sounds, S1 normal and S2 normal. No murmur heard.  Pulmonary:      Effort: Pulmonary effort is normal. No tachypnea or respiratory distress.      Breath sounds: Normal breath sounds and air entry. No decreased breath sounds, wheezing, rhonchi or rales.   Abdominal:      General: Abdomen is flat. Bowel sounds are normal. There is no distension.      Palpations: Abdomen is soft.      Tenderness: There is no abdominal tenderness. There is no guarding or rebound.   Musculoskeletal:      Right lower leg: 2+ Edema present.      Left lower leg: 2+ Edema present.   Skin:     Capillary Refill: Capillary refill takes less than 2 seconds.   Neurological:      General: No focal deficit present.      Mental Status: She is alert. Mental status is at baseline.   Psychiatric:         Behavior: Behavior is cooperative.           Diagnostic Study Results     Labs -  Recent Results (from the past 12 hour(s))   CBC with Auto Differential    Collection Time: 11/06/22  4:06 PM   Result Value Ref Range    WBC 5.0 4.6 - 13.2 K/uL    RBC 4.92 4.20 - 5.30 M/uL    Hemoglobin 12.0 12.0 - 16.0 g/dL    Hematocrit 41.3 24.4 - 45.0 %    MCV 77.8 (L) 78.0 - 100.0 FL    MCH 24.4 24.0 - 34.0 PG    MCHC 31.3 31.0 - 37.0 g/dL    RDW 01.0 (H) 27.2 - 14.5 %    Platelets 216 135 - 420 K/uL    MPV 10.6 9.2 - 11.8 FL    Nucleated RBCs 0.0 0 PER 100 WBC    nRBC 0.00 0.00 - 0.01 K/uL    Neutrophils % 67 40 - 73 %    Lymphocytes % 17 (L) 21 - 52 %    Monocytes % 12 (H) 3 - 10 %    Eosinophils % 3 0 - 5 %    Basophils % 0 0 - 2 %    Immature Granulocytes 0 0.0 - 0.5 %    Neutrophils Absolute 3.4 1.8 - 8.0 K/UL    Lymphocytes Absolute 0.9 0.9 - 3.6 K/UL    Monocytes Absolute 0.6 0.05 - 1.2 K/UL    Eosinophils Absolute 0.2 0.0 - 0.4 K/UL    Basophils Absolute 0.0 0.0 - 0.1 K/UL    Absolute Immature  Granulocyte 0.0 0.00 - 0.04 K/UL  Differential Type AUTOMATED     CMP    Collection Time: 11/06/22  4:06 PM   Result Value Ref Range    Sodium 139 136 - 145 mmol/L    Potassium 4.4 3.5 - 5.5 mmol/L    Chloride 104 100 - 111 mmol/L    CO2 32 21 - 32 mmol/L    Anion Gap 3 3.0 - 18 mmol/L    Glucose 161 (H) 74 - 99 mg/dL    BUN 25 (H) 7.0 - 18 MG/DL    Creatinine 1.610.90 0.6 - 1.3 MG/DL    Bun/Cre Ratio 28 (H) 12 - 20      Est, Glom Filt Rate 71 >60 ml/min/1.2273m2    Calcium 9.6 8.5 - 10.1 MG/DL    Total Bilirubin 0.3 0.2 - 1.0 MG/DL    ALT 096149 (H) 13 - 56 U/L    AST 77 (H) 10 - 38 U/L    Alk Phosphatase 170 (H) 45 - 117 U/L    Total Protein 7.2 6.4 - 8.2 g/dL    Albumin 3.6 3.4 - 5.0 g/dL    Globulin 3.6 2.0 - 4.0 g/dL    Albumin/Globulin Ratio 1.0 0.8 - 1.7     BNP    Collection Time: 11/06/22  4:06 PM   Result Value Ref Range    NT Pro-BNP 337 0 - 900 PG/ML   Urinalysis    Collection Time: 11/06/22  4:06 PM   Result Value Ref Range    Color, UA YELLOW      Appearance CLEAR      Specific Gravity, UA 1.007 1.005 - 1.030      pH, Urine 6.5 5.0 - 8.0      Protein, UA Negative NEG mg/dL    Glucose, UA Negative NEG mg/dL    Ketones, Urine Negative NEG mg/dL    Bilirubin Urine Negative NEG      Blood, Urine Negative NEG      Urobilinogen, Urine 0.2 0.2 - 1.0 EU/dL    Nitrite, Urine Positive (A) NEG      Leukocyte Esterase, Urine MODERATE (A) NEG     Urinalysis, Micro    Collection Time: 11/06/22  4:06 PM   Result Value Ref Range    WBC, UA 10 to 14 0 - 4 /hpf    RBC, UA 1 to 4 0 - 5 /hpf    Epithelial Cells UA 1+ 0 - 5 /lpf    BACTERIA, URINE 4+ (A) NEG /hpf   Vascular duplex lower extremity venous bilateral    Collection Time: 11/06/22  8:19 PM   Result Value Ref Range    Body Surface Area 1.66 m2       Radiologic Studies -   Vascular duplex lower extremity venous bilateral         XR CHEST PORTABLE   Final Result      No acute airspace disease.               Medical Decision Making   I am the first provider for this  patient.    I reviewed the vital signs, available nursing notes, past medical history, past surgical history, family history and social history.    Vital Signs-Reviewed the patient's vital signs.    Pulse Oximetry Analysis -  97 on room air (Interpretation)    Records Reviewed: Prior medical records(Time of Review: 8:28 PM)    ED Course: Progress Notes, Reevaluation, and Consults:  DDx: CHF exacerbation, nephrotic  syndrome, DVT, cellulitis, UTI    Provider Notes (Medical Decision Making):   Patient is a 67 year old female present to the emergency department due to foul-smelling odor and bilateral leg swelling.  On exam, patient is alert, at baseline.  Heart regular rate and rhythm.  No murmurs appreciated.  2+ lower extremity edema.  Lungs clear to auscultation bilaterally.  Abdomen is soft and nontender.  Will obtain baseline labs as well as UA and chest x-ray.    UA concerning for UTI with positive nitrates, moderate leukocyte esterase, 4+ bacteria with 10-14 WBC.    CBC and CMP WNL.  BNP of 337.    Chest x-ray shows no acute abnormalities.    Regarding UTI, will give patient IV Rocephin and discharged home on Keflex twice a day for the next 7 days.  Will send urine for culture.    Ultrasound showed no evidence of DVT bilaterally.    Regarding bilateral leg edema, suspect likely patient's chronic lymphedema.  Patient educated on using compression stockings as well as elevating legs.  Instructed to follow-up with PCP within the next 2 days and when to be reevaluated.  Educated on signs and symptoms to monitor for given strict return precautions.  Patient and daughter display understanding and are in agreement with plan and discharge at this time.    Diagnosis     Clinical Impression:   1. Acute cystitis without hematuria    2. Lymphedema        Disposition: Discharge home with PCP follow-up    Will BonnetFetters, Lisa D, PA  [address]  [address]  [place] [id number]  [phone]      Follow up from ER    [facility]  [facility]  [address]  [place] [id number]  [phone]    As needed, If symptoms worsen          Medication List        START taking these medications      cephALEXin 500 MG capsule  Commonly known as: KEFLEX  Take 1 capsule by mouth 2 times daily for 7 days            ASK your doctor about these medications      acetaminophen 500 MG tablet  Commonly known as: TYLENOL  Take 1 tablet by mouth every 6 hours as needed for Fever or Pain     aspirin 325 MG tablet     atorvastatin 80 MG tablet  Commonly known as: LIPITOR  Take 1 tablet by mouth nightly     baclofen 10 MG tablet  Commonly known as: LIORESAL     carvedilol 6.25 MG tablet  Commonly known as: COREG  Take 1 tablet by mouth 2 times daily (with meals)     cinacalcet 30 MG tablet  Commonly known as: SENSIPAR     ferrous sulfate 325 (65 Fe) MG tablet  Commonly known as: IRON 325  Take 1 tablet by mouth daily     fluconazole 100 MG tablet  Commonly known as: DIFLUCAN     furosemide 40 MG tablet  Commonly known as: LASIX     hydrALAZINE 25 MG tablet  Commonly known as: APRESOLINE  Take 1 tablet by mouth every 8 hours     insulin glargine 100 UNIT/ML injection vial  Commonly known as: LANTUS  Inject 10 Units into the skin daily     insulin lispro 100 UNIT/ML Soln injection vial  Commonly known as: HUMALOG  Inject  3 Units into the skin 3 times daily (with meals)     lactobacillus capsule  Take 1 capsule by mouth daily (with breakfast)     Magnesium Oxide 400 MG Caps     nystatin 100000 UNIT/GM powder  Commonly known as: MYCOSTATIN  Apply 3 times daily.     potassium chloride 10 MEQ extended release tablet  Commonly known as: KLOR-CON M     therapeutic multivitamin-minerals tablet  Take 1 tablet by mouth daily     vitamin C 500 MG tablet  Commonly known as: ASCORBIC ACID               Where to Get Your Medications        These medications [facility] sent to [facility] [address], [place] - [address] - P [phone] Carmon Ginsberg [phone]  [address] [place],  [place] [address]      Phone: [phone]   cephALEXin 500 MG capsule          Dictation disclaimer:  Please note that this dictation was completed with Dragon, the computer voice recognition software.  Quite often unanticipated grammatical, syntax, homophones, and other interpretive errors are inadvertently transcribed by the computer software.  Please disregard these errors.  Please excuse any errors that have escaped final proofreading.        Evert Kohl, PA-C  11/06/22 2028

## 2022-11-06 NOTE — Discharge Instructions (Addendum)
Begin taking Keflex twice a day for 7 days.   Wear compression stockings daily.  Elevate legs as much as possible to help decrease swelling.  Follow-up with PCP within the next few days in order to be re-evaluated.  Return to the ED with any new or worsening symptoms.

## 2022-11-06 NOTE — Other (Signed)
Phone call to patient, she is feeling better since visit to emergency department.  Suspect contaminated urine or proper response to outpatient Keflex.  Advised patient to follow-up with primary care as instructed, return to ED if worsening condition.

## 2022-11-06 NOTE — ED Triage Notes (Addendum)
Received patient in triage with c/o increased BLE swelling and foul odor to urine. BLE have +3 edema, but are not warm to touch. Pt has history of lymph edema.

## 2022-11-06 NOTE — ED Notes (Signed)
Pt incontinent of urine. Pericare performed, new diaper and chux placed. Warm blanket placed on pt.

## 2022-11-07 LAB — CULTURE, URINE

## 2022-11-07 LAB — VAS DUP LOWER EXTREMITY VENOUS BILATERAL: Body Surface Area: 1.66 m2

## 2022-11-07 NOTE — Progress Notes (Signed)
  Subjective:  Patient ID: Julie Elliott, female    DOB: 01-11-1956,  MRN: [id number]  Julie Elliott presents to clinic today for at risk foot care with history of diabetic neuropathy and callus(es) both feet and painful thick toenails that are difficult to trim. Painful toenails interfere with ambulation. Aggravating factors include wearing enclosed shoe gear. Pain is relieved with periodic professional debridement. Painful calluses are aggravated when weightbearing with and without shoegear. Pain is relieved with periodic professional debridement.  Chief Complaint  Patient presents with   Nail Problem    RFC? PCP-[place], [place] PCP VST-2 months ago   New problem(s): None.   PCP is Marolyn Haller, MD.  Allergies  Allergen Reactions   Loratadine-Pseudoephedrine Er Hives    Claritin-D    Review of Systems: Negative except as noted in the HPI.  Objective: No changes noted in today's physical examination. There were no vitals filed for this visit. Julie Elliott is a pleasant 67 y.o. female obese in NAD. AAO x 3.  Neurovascular Examination: CFT <3 seconds b/l LE. Palpable DP pulse(s) b/l LE. Palpable PT pulse(s) b/l LE. Pedal hair absent. No pain with calf compression b/l. Lower extremity skin temperature gradient within normal limits. Nonpitting edema noted BLE. No cyanosis or clubbing noted b/l LE.  Pt has subjective symptoms of neuropathy. Protective sensation intact 5/5 intact bilaterally with 10g monofilament b/l. Vibratory sensation intact b/l.  Dermatological:  Pedal skin is warm and supple b/l LE. oenails 1-5 bilaterally elongated, discolored, dystrophic, thickened, and crumbly with subungual debris and tenderness to dorsal palpation. Multiple nevi noted b/l LE, medial aspect right foot and medial aspect right 5th digit.   Hyperkeratotic lesion submet head 5 left foot and sulcus area left foot.  Interdigital maceration webspaces 3rd and 4th web spaces bilaterally have resolved.  No breaks in skin, no blistering, no signs of secondary bacterial infection.  Musculoskeletal:  Muscle strength 5/5 to all lower extremity muscle groups bilaterally. No pain, crepitus or joint limitation noted with ROM bilateral LE. No gross bony deformities bilaterally.  Assessment/Plan: 1. Pain due to onychomycosis of toenails of both feet   2. Diabetic peripheral neuropathy associated with type 2 diabetes mellitus     -Consent given for treatment as described below: -Examined patient. -Toenails 1-5 b/l were debrided in length and girth with sterile nail nippers and dremel without iatrogenic bleeding.  -Callus(es) sulcus left foot and submet head 5 left foot pared utilizing sharp debridement with sterile blade without complication or incident. Total number debrided =2. -For interdigital tinea pedis, resume betadine paint prn daily until resolved. -Patient/POA to call should there be question/concern in the interim.   Return in about 3 months (around 02/04/2023).  Freddie Breech, DPM

## 2022-11-08 LAB — CULTURE, URINE
Colony count: >100000
Culture: >2

## 2022-11-17 ENCOUNTER — Encounter

## 2022-12-02 ENCOUNTER — Inpatient Hospital Stay: Admit: 2022-12-02 | Payer: MEDICAID | Primary: Physician Assistant

## 2022-12-02 ENCOUNTER — Inpatient Hospital Stay: Admit: 2022-12-02 | Payer: PRIVATE HEALTH INSURANCE | Primary: Physician Assistant

## 2022-12-02 DIAGNOSIS — R911 Solitary pulmonary nodule: Secondary | ICD-10-CM

## 2022-12-02 LAB — LABCORP SPECIMEN COLLECTION

## 2022-12-11 ENCOUNTER — Emergency Department: Admit: 2022-12-11 | Payer: MEDICAID | Primary: Physician Assistant

## 2022-12-11 ENCOUNTER — Inpatient Hospital Stay
Admit: 2022-12-11 | Discharge: 2022-12-11 | Disposition: A | Discharge status: Home / Self Care | Payer: PRIVATE HEALTH INSURANCE | Attending: Emergency Medicine

## 2022-12-11 LAB — COMPREHENSIVE METABOLIC PANEL
ALT: 176 U/L — ABNORMAL HIGH (ref 13–56)
AST: 72 U/L — ABNORMAL HIGH (ref 10–38)
Albumin/Globulin Ratio: 1 (ref 0.8–1.7)
Albumin: 3.4 g/dL (ref 3.4–5.0)
Alk Phosphatase: 163 U/L — ABNORMAL HIGH (ref 45–117)
Anion Gap: 2 mmol/L — ABNORMAL LOW (ref 3.0–18)
BUN/Creatinine Ratio: 33 — ABNORMAL HIGH (ref 12–20)
BUN: 29 mg/dL — ABNORMAL HIGH (ref 7.0–18)
CO2: 32 mmol/L (ref 21–32)
Calcium: 9.3 mg/dL (ref 8.5–10.1)
Chloride: 110 mmol/L (ref 100–111)
Creatinine: 0.89 mg/dL (ref 0.6–1.3)
Est, Glom Filt Rate: 72 mL/min/{1.73_m2} (ref >60)
Globulin: 3.4 g/dL (ref 2.0–4.0)
Glucose: 84 mg/dL (ref 74–99)
Potassium: 4.1 mmol/L (ref 3.5–5.5)
Sodium: 144 mmol/L (ref 136–145)
Total Bilirubin: 0.3 mg/dL (ref 0.2–1.0)
Total Protein: 6.8 g/dL (ref 6.4–8.2)

## 2022-12-11 LAB — CBC WITH AUTO DIFFERENTIAL
Basophils %: 0 % (ref 0–2)
Basophils Absolute: 0 10*3/uL (ref 0.0–0.1)
Eosinophils %: 2 % (ref 0–5)
Eosinophils Absolute: 0.1 10*3/uL (ref 0.0–0.4)
Hematocrit: 35.8 % (ref 35.0–45.0)
Hemoglobin: 11.2 g/dL — ABNORMAL LOW (ref 12.0–16.0)
Immature Granulocytes %: 0 % (ref 0.0–0.5)
Immature Granulocytes Absolute: 0 10*3/uL (ref 0.00–0.04)
Lymphocytes %: 19 % — ABNORMAL LOW (ref 21–52)
Lymphocytes Absolute: 0.9 10*3/uL (ref 0.9–3.6)
MCH: 24.9 pg (ref 24.0–34.0)
MCHC: 31.3 g/dL (ref 31.0–37.0)
MCV: 79.6 FL (ref 78.0–100.0)
MPV: 10.7 FL (ref 9.2–11.8)
Monocytes %: 12 % — ABNORMAL HIGH (ref 3–10)
Monocytes Absolute: 0.5 10*3/uL (ref 0.05–1.2)
Neutrophils %: 67 % (ref 40–73)
Neutrophils Absolute: 3.1 10*3/uL (ref 1.8–8.0)
Nucleated RBCs: 0 /100{WBCs}
Platelets: 180 10*3/uL (ref 135–420)
RBC: 4.5 M/uL (ref 4.20–5.30)
RDW: 15.7 % — ABNORMAL HIGH (ref 11.6–14.5)
WBC: 4.6 10*3/uL (ref 4.6–13.2)
nRBC: 0 10*3/uL (ref 0.00–0.01)

## 2022-12-11 LAB — URINE DRUG SCREEN
Amphetamine, Urine: NEGATIVE
Barbiturates, Urine: NEGATIVE
Benzodiazepines, Urine: NEGATIVE
Cocaine, Urine: NEGATIVE
Methadone, Urine: NEGATIVE
Opiates, Urine: NEGATIVE
Phencyclidine, Urine: NEGATIVE
THC, TH-Cannabinol, Urine: NEGATIVE

## 2022-12-11 LAB — TSH: TSH, 3rd Generation: 0.04 u[IU]/mL — ABNORMAL LOW (ref 0.36–3.74)

## 2022-12-11 LAB — URINALYSIS
Bilirubin, Urine: NEGATIVE
Blood, Urine: NEGATIVE
Glucose, Ur: NEGATIVE mg/dL
Ketones, Urine: NEGATIVE mg/dL
Leukocyte Esterase, Urine: NEGATIVE
Nitrite, Urine: NEGATIVE
Protein, UA: NEGATIVE mg/dL
Specific Gravity, UA: 1.014 (ref 1.005–1.030)
Urobilinogen, Urine: 1 EU/dL (ref 0.2–1.0)
pH, Urine: 6.5 (ref 5.0–8.0)

## 2022-12-11 LAB — COVID-19 & INFLUENZA COMBO
Rapid Influenza A By PCR: NOT DETECTED
Rapid Influenza B By PCR: NOT DETECTED
SARS-CoV-2, PCR: NOT DETECTED

## 2022-12-11 LAB — T4, FREE: T4 Free: 1.2 ng/dL (ref 0.7–1.5)

## 2022-12-11 LAB — ETHANOL: Ethanol Lvl: <3 mg/dL (ref 0–3)

## 2022-12-11 LAB — MAGNESIUM: Magnesium: 1.4 mg/dL — ABNORMAL LOW (ref 1.6–2.6)

## 2022-12-11 LAB — CK: Total CK: 109 U/L (ref 26–192)

## 2022-12-11 MED ORDER — KETOROLAC TROMETHAMINE 10 MG PO TABS
10 | ORAL_TABLET | Freq: Four times a day (QID) | ORAL | 0 refills | Status: AC | PRN
Start: 2022-12-11 — End: ?

## 2022-12-11 MED ORDER — KETOROLAC TROMETHAMINE 15 MG/ML IJ SOLN
15 | Freq: Once | INTRAMUSCULAR | Status: AC
Start: 2022-12-11 — End: 2022-12-11
  Administered 2022-12-11: 08:00:00 15 mg via INTRAVENOUS

## 2022-12-11 MED ORDER — TIZANIDINE HCL 4 MG PO TABS
4 | ORAL_TABLET | Freq: Three times a day (TID) | ORAL | 0 refills | Status: AC | PRN
Start: 2022-12-11 — End: ?

## 2022-12-11 MED ORDER — MAGNESIUM SULFATE IN D5W 1-5 GM/100ML-% IV SOLN
1-5 | Freq: Once | INTRAVENOUS | Status: AC
Start: 2022-12-11 — End: 2022-12-11
  Administered 2022-12-11: 06:00:00 1000 mg via INTRAVENOUS

## 2022-12-11 MED ORDER — SODIUM CHLORIDE 0.9 % IV BOLUS
0.9 | Freq: Once | INTRAVENOUS | Status: AC
Start: 2022-12-11 — End: 2022-12-11
  Administered 2022-12-11: 06:00:00 1000 mL via INTRAVENOUS

## 2022-12-11 MED ORDER — TIZANIDINE HCL 2 MG PO TABS
2 | Freq: Once | ORAL | Status: AC
Start: 2022-12-11 — End: 2022-12-11
  Administered 2022-12-11: 08:00:00 4 mg via ORAL

## 2022-12-11 MED FILL — TIZANIDINE HCL 2 MG PO TABS: 2 MG | ORAL | Qty: 2

## 2022-12-11 MED FILL — KETOROLAC TROMETHAMINE 15 MG/ML IJ SOLN: 15 MG/ML | INTRAMUSCULAR | Qty: 1

## 2022-12-11 MED FILL — MAGNESIUM SULFATE IN D5W 1-5 GM/100ML-% IV SOLN: 1-5 GM/100ML-% | INTRAVENOUS | Qty: 100

## 2022-12-11 MED FILL — SODIUM CHLORIDE 0.9 % IV SOLN: 0.9 % | INTRAVENOUS | Qty: 1000

## 2022-12-11 NOTE — ED Notes (Signed)
Pt C/O muscle spasms. MD Mariah Milling made aware. Orders placed.

## 2022-12-11 NOTE — ED Notes (Signed)
Pt DC. DC instructions and education completed. Pt and pt daughter verbalized understanding. IV removed.

## 2022-12-11 NOTE — ED Provider Notes (Signed)
EMERGENCY DEPARTMENT HISTORY AND PHYSICAL EXAM      Date: 12/11/2022  Patient Name: [name] Short      History of Presenting Illness     Chief Complaint   Patient presents with    Altered Mental Status       Location/Duration/Severity/Modifying factors   Chief Complaint   Patient presents with    Altered Mental Status       HPI:  [name] Short is a 67 y.o. female with PMH significant for multiple medical comorbidities including chronic lymphedema of the right leg, hypertension, CVA, hyperlipidemia, diabetes mellitus type 2 presents with low-grade fever noted tonight, nasal congestion, left arm pain diffusely without swelling.  Daughter also felt her mental status was off tonight.  Pt  denies cough, shortness of breath, chest pain, abdominal pain, nausea or vomiting, diarrhea.  Daughter concern for foul-smelling urine.  Recently treated for UTI 1 month ago.  Treatments attempted at home include none.  Prior history of DVT in one of her legs in the past.    PCP: Will Bonnet, PA    No current facility-administered medications for this encounter.     Current Outpatient Medications   Medication Sig Dispense Refill    tiZANidine (ZANAFLEX) 4 MG tablet Take 1 tablet by mouth every 8 hours as needed (Muscle spasm) 15 tablet 0    ketorolac (TORADOL) 10 MG tablet Take 1 tablet by mouth every 6 hours as needed for Pain 20 tablet 0    vitamin C (ASCORBIC ACID) 500 MG tablet Take 500 mg by mouth daily.      fluconazole (DIFLUCAN) 100 MG tablet Take 100 mg by mouth daily.      nystatin (MYCOSTATIN) 100000 UNIT/GM powder Apply 3 times daily. 30 g 0    insulin glargine (LANTUS) 100 UNIT/ML injection vial Inject 10 Units into the skin daily 1 each 0    insulin lispro (HUMALOG) 100 UNIT/ML SOLN injection vial Inject 3 Units into the skin 3 times daily (with meals) 1 each 0    hydrALAZINE (APRESOLINE) 25 MG tablet Take 1 tablet by mouth every 8 hours 90 tablet 3    potassium chloride (KLOR-CON M) 10 MEQ extended release tablet  Take 1 tablet by mouth daily      cinacalcet (SENSIPAR) 30 MG tablet Take 1 tablet by mouth 2 times daily      lactobacillus (CULTURELLE) capsule Take 1 capsule by mouth daily (with breakfast) 60 capsule 0    carvedilol (COREG) 6.25 MG tablet Take 1 tablet by mouth 2 times daily (with meals) 60 tablet 3    baclofen (LIORESAL) 10 MG tablet Take 1 tablet by mouth 3 times daily as needed (PAIN) Indications: Pain FOR PAIN PER SHETH      Magnesium Oxide 400 MG CAPS Take 1 capsule by mouth daily.      aspirin 325 MG tablet Take 1 tablet by mouth daily      acetaminophen (TYLENOL) 500 MG tablet Take 1 tablet by mouth every 6 hours as needed for Fever or Pain 30 tablet 0    atorvastatin (LIPITOR) 80 MG tablet Take 1 tablet by mouth nightly 30 tablet 3    ferrous sulfate (IRON 325) 325 (65 Fe) MG tablet Take 1 tablet by mouth daily 30 tablet     Multiple Vitamins-Minerals (THERAPEUTIC MULTIVITAMIN-MINERALS) tablet Take 1 tablet by mouth daily      furosemide (LASIX) 40 MG tablet furosemide 40 mg tablet   TAKE 1 TABLET BY MOUTH  EVERY DAY         Past History     Past Medical History:  Past Medical History:   Diagnosis Date    Anemia     Arthritis     Chronic pain     Legs and Shoulders    Diabetes (HCC)     Exposure to asbestos     History of blood transfusion     History of colon polyps     HTN (hypertension)     Hx of blood clots     DVT    Hyperlipemia     Menopause     Pulmonary nodule     Serum calcium elevated     Sleep apnea     not using cpap    Stroke [facility])        Past Surgical History:  Past Surgical History:   Procedure Laterality Date    BACK SURGERY N/A 06/24/2022    INCISION AND DRAINAGE /DEBRIDEMENT SACRAL DECUBITUS performed by Phineas Semen, DO at [facility] MAIN OR    CESAREAN SECTION  1987    COLONOSCOPY N/A 01/13/2017    COLONOSCOPY performed by Levan Hurst, MD at [facility] ENDOSCOPY    COLONOSCOPY      DILATION AND CURETTAGE OF UTERUS  1995    RECTAL SURGERY N/A 12/28/2021    SACRAL WOUND DEBRIDEMENT INCISION  AND DRAINAGE performed by Phineas Semen, DO at [facility] MAIN OR    SPINE SURGERY N/A 12/09/2021    CERVICAL THREE/FOUR/FIVE/SIX LAMINECTOMY FUSION; C-ARM; STRYKER; EXT BONE GROWTH STIM; 23 HR performed by Pamalee Leyden, MD at [facility] MAIN OR       Family History:  Family History   Problem Relation Age of Onset    Diabetes Other 53        parent, NOS    Cancer Father     Hypertension Other 35        parent,NOS    Heart Disease Other 72        parent, NOS    Lung Disease Father        Social History:  Social History     Tobacco Use    Smoking status: Former     Current packs/day: 0.00     Types: Cigarettes, Cigars     Quit date: 11/13/2021     Years since quitting: 1.0    Smokeless tobacco: Never   Vaping Use    Vaping Use: Never used   Substance Use Topics    Alcohol use: Not Currently     Comment: occ    Drug use: Never       Allergies:  Allergies   Allergen Reactions    Lactose Other (See Comments)     Intolerance         Physical Exam     General: Patient is awake and alert, resting comfortably in no acute distress.  Cardiovascular: RRR, no murmurs.  Respiratory: Normal respiratory effort. Lung sounds clear to auscultation.  GI: soft, non-tender, non-distended.  Skin: Nontender or warm, blanching induration to the left anterior mid tibial region, no palpable fluctuance.  Neuro: The patient is alert and oriented.  Psych: Appropriate mood and affect.        Lab and Diagnostic Study Results     Labs -  Recent Results (from the past 24 hour(s))   CBC with Auto Differential    Collection Time: 12/11/22 12:32 AM   Result Value Ref Range    WBC  4.6 4.6 - 13.2 K/uL    RBC 4.50 4.20 - 5.30 M/uL    Hemoglobin 11.2 (L) 12.0 - 16.0 g/dL    Hematocrit 16.1 09.6 - 45.0 %    MCV 79.6 78.0 - 100.0 FL    MCH 24.9 24.0 - 34.0 PG    MCHC 31.3 31.0 - 37.0 g/dL    RDW 04.5 (H) 40.9 - 14.5 %    Platelets 180 135 - 420 K/uL    MPV 10.7 9.2 - 11.8 FL    Nucleated RBCs 0.0 0 PER 100 WBC    nRBC 0.00 0.00 - 0.01 K/uL    Neutrophils % 67 40 - 73 %     Lymphocytes % 19 (L) 21 - 52 %    Monocytes % 12 (H) 3 - 10 %    Eosinophils % 2 0 - 5 %    Basophils % 0 0 - 2 %    Immature Granulocytes % 0 0.0 - 0.5 %    Neutrophils Absolute 3.1 1.8 - 8.0 K/UL    Lymphocytes Absolute 0.9 0.9 - 3.6 K/UL    Monocytes Absolute 0.5 0.05 - 1.2 K/UL    Eosinophils Absolute 0.1 0.0 - 0.4 K/UL    Basophils Absolute 0.0 0.0 - 0.1 K/UL    Immature Granulocytes Absolute 0.0 0.00 - 0.04 K/UL    Differential Type AUTOMATED     CMP    Collection Time: 12/11/22 12:32 AM   Result Value Ref Range    Sodium 144 136 - 145 mmol/L    Potassium 4.1 3.5 - 5.5 mmol/L    Chloride 110 100 - 111 mmol/L    CO2 32 21 - 32 mmol/L    Anion Gap 2 (L) 3.0 - 18 mmol/L    Glucose 84 74 - 99 mg/dL    BUN 29 (H) 7.0 - 18 MG/DL    Creatinine 8.11 0.6 - 1.3 MG/DL    Bun/Cre Ratio 33 (H) 12 - 20      Est, Glom Filt Rate 72 >60 ml/min/1.38m2    Calcium 9.3 8.5 - 10.1 MG/DL    Total Bilirubin 0.3 0.2 - 1.0 MG/DL    ALT 914 (H) 13 - 56 U/L    AST 72 (H) 10 - 38 U/L    Alk Phosphatase 163 (H) 45 - 117 U/L    Total Protein 6.8 6.4 - 8.2 g/dL    Albumin 3.4 3.4 - 5.0 g/dL    Globulin 3.4 2.0 - 4.0 g/dL    Albumin/Globulin Ratio 1.0 0.8 - 1.7     TSH    Collection Time: 12/11/22 12:32 AM   Result Value Ref Range    TSH, 3rd Generation 0.04 (L) 0.36 - 3.74 uIU/mL   ETOH    Collection Time: 12/11/22 12:32 AM   Result Value Ref Range    Ethanol Lvl <3 0 - 3 MG/DL   Magnesium    Collection Time: 12/11/22 12:32 AM   Result Value Ref Range    Magnesium 1.4 (L) 1.6 - 2.6 mg/dL   T4, Free    Collection Time: 12/11/22 12:32 AM   Result Value Ref Range    T4 Free 1.2 0.7 - 1.5 NG/DL   CK    Collection Time: 12/11/22 12:32 AM   Result Value Ref Range    Total CK 109 26 - 192 U/L   COVID-19 & Influenza Combo    Collection Time: 12/11/22  1:18 AM    Specimen:  Nasopharyngeal   Result Value Ref Range    SARS-CoV-2, PCR Not detected NOTD      Rapid Influenza A By PCR Not detected NOTD      Rapid Influenza B By PCR Not detected NOTD      Urinalysis    Collection Time: 12/11/22  3:08 AM   Result Value Ref Range    Color, UA YELLOW      Appearance CLEAR      Specific Gravity, UA 1.014 1.005 - 1.030      pH, Urine 6.5 5.0 - 8.0      Protein, UA Negative NEG mg/dL    Glucose, Ur Negative NEG mg/dL    Ketones, Urine Negative NEG mg/dL    Bilirubin, Urine Negative NEG      Blood, Urine Negative NEG      Urobilinogen, Urine 1.0 0.2 - 1.0 EU/dL    Nitrite, Urine Negative NEG      Leukocyte Esterase, Urine Negative NEG     Urine Drug Screen    Collection Time: 12/11/22  3:08 AM   Result Value Ref Range    Benzodiazepines, Urine Negative NEG      Barbiturates, Urine Negative NEG      THC, TH-Cannabinol, Urine Negative NEG      Opiates, Urine Negative NEG      PCP, Urine Negative NEG      Cocaine, Urine Negative NEG      Amphetamine, Urine Negative NEG      Methadone, Urine Negative NEG      Comments: (NOTE)        Radiologic Studies -   XR CHEST PORTABLE    (Results Pending)         Procedures and Critical Care     Performed by: Louretta Parma, DO    Procedures     Louretta Parma, DO    Medical Decision Making and ED Course   - I am the first and primary provider for this patient AND AM THE PRIMARY PROVIDER OF RECORD.    - I reviewed the vital signs, available nursing notes, past medical history, past surgical history, family history and social history.    - Initial assessment performed. The patients presenting problems have been discussed, and the staff are in agreement with the care plan formulated and outlined with them.  I have encouraged them to ask questions as they arise throughout their visit.    Vital Signs-Reviewed the patient's vital signs.    Patient Vitals for the past 12 hrs:   Temp Pulse Resp BP SpO2   12/11/22 0330 -- 76 21 (!) 160/84 100 %   12/11/22 0130 -- 67 12 (!) 138/49 100 %   12/11/22 0115 -- 65 12 (!) 122/46 98 %   12/11/22 0100 -- 73 12 128/65 100 %   12/11/22 0015 98.2 F (36.8 C) 70 18 (!) 136/51 98 %         Provider Notes  (Medical Decision Making):   ED Course as of 12/11/22 0422   Thu Dec 11, 2022   0056 Normal sinus rhythm, no ST segment elevations or depressions, no T wave inversions, normal QTc interval, no hyperacute T waves, no Q waves noted [JM]      ED Course User Index  [JM] Louretta Parma, DO       Initial Differential Diagnosis: Acute cystitis, acute viral syndrome, dehydration.  Does have chronic appearing transaminitis, negative serum ethanol level.  Chest x-ray shows possible hazy  linear infiltrate to the right lung base, no cardiomegaly, pneumothorax appreciated or pleural effusions.    Clinical presentation most consistent with possible acute upper respiratory viral infection, myalgias secondary to hypomagnesemia versus dehydration versus viral illness.  TSH level significantly decreased suggesting possible hyperthyroidism however presentation not consistent with hyperthyroid storm is not tachycardic, altered, diaphoretic, febrile or significantly hypertensive.  Administered tizanidine and Toradol for acute onset muscle spasms versus cramps which seem to help.  Added T4 and CK levels which were within normal limits suggesting against clinical hyperthyroidism, rhabdomyolysis does have relatively stable transaminitis of unclear etiology but no abdominal pain or other symptoms to warrant gallbladder ultrasound, CT imaging or MRCP as total bilirubin level within normal normal limits.  Urinalysis not suggestive of UTI.  Question of a slight infiltrate to the right lower lung however no cough, fever or leukocytosis here thus we will hold off on empiric treatment for commune acquired pneumonia.    Documentation/Prior Results Review:  Nursing notes    Social Determinants of Health: None identified    Is this patient to be included in the SEP-1 core measure due to severe sepsis or septic shock? No Exclusion criteria - the patient is NOT to be included for SEP-1 Core Measure due to: 2+ SIRS criteria are not met    The patient  will be discharged home. The patient was reassured that these symptoms do not appear to represent a serious or life threatening condition at this time. Warning signs of worsening condition were discusses and understood by the patient. Based on patient's age, coexisting illness, exam and the results of this ED evaluation, the decision to treat as an outpatient was made. Based on the information available at time of discharge, acute pathology requiring immediate intervention was deemed relative unlikely. While it is impossible to completely exclude the possibility of underlying serious disease or worsening condition, I feel the relative likelihood is extremely low. I discussed this uncertainty with the patient and/or family member/caregiver, who understood ED evaluation and treatment and felt comfortable with the outpatient treatment plan. All questions regarding care, test results and follow up were answered. At discharge, pt looked well, nontoxic, no distress, and is good candidate for outpatient follow up. They understand that they should return to the Emergency Department for any new or worsening symptoms. I stressed the importance of follow up for repeat assessment and possibly further evaluation/treatment.        Meds Given in ED:  Medications   magnesium sulfate 1000 mg in dextrose 5% 100 mL IVPB (0 mg IntraVENous Stopped 12/11/22 0314)   sodium chloride 0.9 % bolus 1,000 mL (1,000 mLs IntraVENous New Bag 12/11/22 0219)   tiZANidine (ZANAFLEX) tablet 4 mg (4 mg Oral Given 12/11/22 0331)   ketorolac (TORADOL) injection 15 mg (15 mg IntraVENous Given 12/11/22 0331)       Final Diagnosis:  1. Hypomagnesemia    2. History of fever    3. Transaminitis    4. Myalgia        Disposition:  Destination: Discharge    Discharge Rx:   New Prescriptions    KETOROLAC (TORADOL) 10 MG TABLET    Take 1 tablet by mouth every 6 hours as needed for Pain    TIZANIDINE (ZANAFLEX) 4 MG TABLET    Take 1 tablet by mouth every 8 hours as needed  (Muscle spasm)         Dictation disclaimer: Please note that this dictation was completed with Dragon, the computer  voice recognition software. Quite often unanticipated grammatical, syntax, homophones, and other interpretive errors are inadvertently transcribed by the computer software. Please disregard these errors. Please excuse any errors that have escaped final proofreading.     Louretta Parma, D.O.  Emergency Physician  [facility]             Louretta Parma, [place]  12/11/22 [phone]

## 2022-12-11 NOTE — ED Notes (Signed)
Pt educated on need for urine sample

## 2022-12-11 NOTE — ED Notes (Signed)
Pt arrived via Triage and placed in a wheelchair, uses a walker at home but having difficulty walking since yesterday. C/o of chronic left lower leg pain, new left arm pain. Per daughter she seems slower to respond and is concerned she may have a UTI.

## 2022-12-11 NOTE — ED Notes (Signed)
Pt refused straight cath. Pt provided water to help urinate.

## 2022-12-11 NOTE — ED Notes (Signed)
Urine sent to lab 

## 2022-12-11 NOTE — Discharge Instructions (Addendum)
Have your magnesium level rechecked ideally within 1 to 2 weeks.  Can take ibuprofen and/or Tylenol for fever, pain relief.  Do not find a specific cause for your low-grade fever at this time.  Suspect acute viral infection.

## 2022-12-13 LAB — EKG 12-LEAD
Atrial Rate: 66 {beats}/min
Diagnosis: NORMAL
P Axis: 63 degrees
P-R Interval: 140 ms
Q-T Interval: 396 ms
QRS Duration: 92 ms
QTc Calculation (Bazett): 415 ms
R Axis: 7 degrees
T Axis: 35 degrees
Ventricular Rate: 66 {beats}/min

## 2022-12-23 ENCOUNTER — Telehealth: Payer: Self-pay | Admitting: *Deleted

## 2022-12-23 NOTE — Patient Outreach (Signed)
  Care Coordination   12/23/2022 Name: Julie Elliott MRN: [id number] DOB: 07/15/1956   Care Coordination Outreach Attempts:  An unsuccessful telephone outreach was attempted today to offer the patient information about available care coordination services.  Follow Up Plan:  Additional outreach attempts will be made to offer the patient care coordination information and services.   Encounter Outcome:  No Answer   Care Coordination Interventions:  No, not indicated    Reece Levy, MSW, LCSW Clinical Social Worker [facility] [employer] [facility] [phone]

## 2023-01-05 ENCOUNTER — Encounter: Payer: Self-pay | Admitting: *Deleted

## 2023-01-13 NOTE — Progress Notes (Signed)
Lf vm for pt to schedule a sooner apt w/Dr. Bea Graff

## 2023-01-30 ENCOUNTER — Emergency Department: Admit: 2023-01-30 | Discharge: 2023-02-03 | Payer: MEDICARE | Primary: Physician Assistant

## 2023-01-30 ENCOUNTER — Emergency Department: Admit: 2023-01-30 | Payer: MEDICARE | Primary: Physician Assistant

## 2023-01-30 ENCOUNTER — Inpatient Hospital Stay: Admit: 2023-01-30 | Discharge: 2023-01-30 | Disposition: A | Discharge status: Home / Self Care | Payer: Medicare Other

## 2023-01-30 DIAGNOSIS — W19XXXA Unspecified fall, initial encounter: Secondary | ICD-10-CM

## 2023-01-30 DIAGNOSIS — S39012A Strain of muscle, fascia and tendon of lower back, initial encounter: Secondary | ICD-10-CM

## 2023-01-30 LAB — CBC WITH AUTO DIFFERENTIAL
Basophils %: 0 % (ref 0–2)
Basophils Absolute: 0 10*3/uL (ref 0.0–0.1)
Eosinophils %: 3 % (ref 0–5)
Eosinophils Absolute: 0.2 10*3/uL (ref 0.0–0.4)
Hematocrit: 38.3 % (ref 35.0–45.0)
Hemoglobin: 12.6 g/dL (ref 12.0–16.0)
Immature Granulocytes %: 0 % (ref 0.0–0.5)
Immature Granulocytes Absolute: 0 10*3/uL (ref 0.00–0.04)
Lymphocytes %: 16 % — ABNORMAL LOW (ref 21–52)
Lymphocytes Absolute: 0.8 10*3/uL — ABNORMAL LOW (ref 0.9–3.6)
MCH: 26 pg (ref 24.0–34.0)
MCHC: 32.9 g/dL (ref 31.0–37.0)
MCV: 79.1 FL (ref 78.0–100.0)
MPV: 10.5 FL (ref 9.2–11.8)
Monocytes %: 11 % — ABNORMAL HIGH (ref 3–10)
Monocytes Absolute: 0.6 10*3/uL (ref 0.05–1.2)
Neutrophils %: 69 % (ref 40–73)
Neutrophils Absolute: 3.5 10*3/uL (ref 1.8–8.0)
Nucleated RBCs: 0 /100{WBCs}
Platelets: 187 10*3/uL (ref 135–420)
RBC: 4.84 M/uL (ref 4.20–5.30)
RDW: 13.8 % (ref 11.6–14.5)
WBC: 5 10*3/uL (ref 4.6–13.2)
nRBC: 0 10*3/uL (ref 0.00–0.01)

## 2023-01-30 LAB — COMPREHENSIVE METABOLIC PANEL
ALT: 112 U/L — ABNORMAL HIGH (ref 13–56)
AST: 57 U/L — ABNORMAL HIGH (ref 10–38)
Albumin/Globulin Ratio: 1 (ref 0.8–1.7)
Albumin: 3.5 g/dL (ref 3.4–5.0)
Alk Phosphatase: 135 U/L — ABNORMAL HIGH (ref 45–117)
Anion Gap: 3 mmol/L (ref 3.0–18)
BUN/Creatinine Ratio: 18 (ref 12–20)
BUN: 14 mg/dL (ref 7.0–18)
CO2: 29 mmol/L (ref 21–32)
Calcium: 10.3 mg/dL — ABNORMAL HIGH (ref 8.5–10.1)
Chloride: 109 mmol/L (ref 100–111)
Creatinine: 0.76 mg/dL (ref 0.6–1.3)
Est, Glom Filt Rate: 87 mL/min/{1.73_m2} (ref >60)
Globulin: 3.5 g/dL (ref 2.0–4.0)
Glucose: 88 mg/dL (ref 74–99)
Potassium: 4.1 mmol/L (ref 3.5–5.5)
Sodium: 141 mmol/L (ref 136–145)
Total Bilirubin: 0.4 mg/dL (ref 0.2–1.0)
Total Protein: 7 g/dL (ref 6.4–8.2)

## 2023-01-30 LAB — URINALYSIS, MICRO
RBC, UA: 0 /HPF (ref 0–5)
WBC, UA: 0 /HPF (ref 0–4)

## 2023-01-30 LAB — URINALYSIS
Bilirubin, Urine: NEGATIVE
Blood, Urine: NEGATIVE
Glucose, Ur: NEGATIVE mg/dL
Ketones, Urine: NEGATIVE mg/dL
Leukocyte Esterase, Urine: NEGATIVE
Nitrite, Urine: NEGATIVE
Protein, UA: 30 mg/dL — AB
Specific Gravity, UA: 1.013 (ref 1.005–1.030)
Urobilinogen, Urine: 1 EU/dL (ref 0.2–1.0)
pH, Urine: 8 (ref 5.0–8.0)

## 2023-01-30 LAB — MAGNESIUM: Magnesium: 1.5 mg/dL — ABNORMAL LOW (ref 1.6–2.6)

## 2023-01-30 LAB — POCT GLUCOSE: POC Glucose: 75 mg/dL (ref 70–110)

## 2023-01-30 MED ORDER — BACLOFEN 10 MG PO TABS
10 | Freq: Once | ORAL | Status: AC
Start: 2023-01-30 — End: 2023-01-30
  Administered 2023-01-30: 21:00:00 10 mg via ORAL

## 2023-01-30 MED ORDER — KETOROLAC TROMETHAMINE 15 MG/ML IJ SOLN
15 MG/ML | INTRAMUSCULAR | Status: AC
Start: 2023-01-30 — End: 2023-01-30
  Administered 2023-01-30: 21:00:00 15 mg via INTRAVENOUS

## 2023-01-30 MED FILL — KETOROLAC TROMETHAMINE 15 MG/ML IJ SOLN: 15 MG/ML | INTRAMUSCULAR | Qty: 1

## 2023-01-30 MED FILL — BACLOFEN 10 MG PO TABS: 10 MG | ORAL | Qty: 1

## 2023-01-30 NOTE — ED Notes (Signed)
Patient placed on bedpan and urine collected and sent

## 2023-01-30 NOTE — ED Triage Notes (Addendum)
Patient fell down approx 2 carpeted steps. Patient states she is having 6/10 back and butt pain. She also states her knees are stiff and legs are in pain. Denies hitting head. Patient reports takes baby asa daily. Ambulates with walker normally, using rails at the time.

## 2023-01-30 NOTE — ED Provider Notes (Cosign Needed)
EMERGENCY DEPARTMENT HISTORY AND PHYSICAL EXAM      Date: 01/30/2023  Patient Name: [name] Short    History of Presenting Illness     Chief Complaint   Patient presents with    Fall       History (Context): Laurielle Plock is a 67 y.o. female with significant past medical history of CVA, hyperlipidemia, anemia, DM, sleep apnea presents via ambulance to the ED today. Patient states prior to arrival she was walking down her steps letting her dogs out, both of her knees gave out and she fell on her right side. Patient reports her knees commonly give out and she believes it may be related to arthritis. Denies hitting head and LOC. Patient reports taking baby aspirin daily.  Denies taking blood thinners.  Patient ports pain to her left knee and low back.  Denies numbness, tingling, radiating pain, weakness.  Patient was ambulatory after event.  Denies dysuria, hematuria, vomiting, diarrhea, fever, chills.  Denies CP, SOB, dizziness.  Patient does not feel she had a syncopal episode and denies lightheadedness.  Patient believes she tripped and fell    PCP: Will Bonnet, PA    No current facility-administered medications for this encounter.     Current Outpatient Medications   Medication Sig Dispense Refill    tiZANidine (ZANAFLEX) 4 MG tablet Take 1 tablet by mouth every 8 hours as needed (Muscle spasm) 15 tablet 0    ketorolac (TORADOL) 10 MG tablet Take 1 tablet by mouth every 6 hours as needed for Pain 20 tablet 0    vitamin C (ASCORBIC ACID) 500 MG tablet Take 500 mg by mouth daily.      fluconazole (DIFLUCAN) 100 MG tablet Take 100 mg by mouth daily.      nystatin (MYCOSTATIN) 100000 UNIT/GM powder Apply 3 times daily. 30 g 0    insulin glargine (LANTUS) 100 UNIT/ML injection vial Inject 10 Units into the skin daily 1 each 0    insulin lispro (HUMALOG) 100 UNIT/ML SOLN injection vial Inject 3 Units into the skin 3 times daily (with meals) 1 each 0    hydrALAZINE (APRESOLINE) 25 MG tablet Take 1 tablet by mouth every  8 hours 90 tablet 3    potassium chloride (KLOR-CON M) 10 MEQ extended release tablet Take 1 tablet by mouth daily      cinacalcet (SENSIPAR) 30 MG tablet Take 1 tablet by mouth 2 times daily      lactobacillus (CULTURELLE) capsule Take 1 capsule by mouth daily (with breakfast) 60 capsule 0    carvedilol (COREG) 6.25 MG tablet Take 1 tablet by mouth 2 times daily (with meals) 60 tablet 3    baclofen (LIORESAL) 10 MG tablet Take 1 tablet by mouth 3 times daily as needed (PAIN) Indications: Pain FOR PAIN PER SHETH      Magnesium Oxide 400 MG CAPS Take 1 capsule by mouth daily.      aspirin 325 MG tablet Take 1 tablet by mouth daily      acetaminophen (TYLENOL) 500 MG tablet Take 1 tablet by mouth every 6 hours as needed for Fever or Pain 30 tablet 0    atorvastatin (LIPITOR) 80 MG tablet Take 1 tablet by mouth nightly 30 tablet 3    ferrous sulfate (IRON 325) 325 (65 Fe) MG tablet Take 1 tablet by mouth daily 30 tablet     Multiple Vitamins-Minerals (THERAPEUTIC MULTIVITAMIN-MINERALS) tablet Take 1 tablet by mouth daily      furosemide (LASIX)  40 MG tablet furosemide 40 mg tablet   TAKE 1 TABLET BY MOUTH EVERY DAY         Past History     Past Medical History:   Past Medical History:   Diagnosis Date    Anemia     Arthritis     Chronic pain     Legs and Shoulders    Diabetes (HCC)     Exposure to asbestos     History of blood transfusion     History of colon polyps     HTN (hypertension)     Hx of blood clots     DVT    Hyperlipemia     Menopause     Pulmonary nodule     Serum calcium elevated     Sleep apnea     not using cpap    Stroke [facility])        Past Surgical History:  Past Surgical History:   Procedure Laterality Date    BACK SURGERY N/A 06/24/2022    INCISION AND DRAINAGE /DEBRIDEMENT SACRAL DECUBITUS performed by Phineas Semen, DO at [facility] MAIN OR    CESAREAN SECTION  1987    COLONOSCOPY N/A 01/13/2017    COLONOSCOPY performed by Levan Hurst, MD at [facility] ENDOSCOPY    COLONOSCOPY      DILATION AND CURETTAGE  OF UTERUS  1995    RECTAL SURGERY N/A 12/28/2021    SACRAL WOUND DEBRIDEMENT INCISION AND DRAINAGE performed by Phineas Semen, DO at [facility] MAIN OR    SPINE SURGERY N/A 12/09/2021    CERVICAL THREE/FOUR/FIVE/SIX LAMINECTOMY FUSION; C-ARM; STRYKER; EXT BONE GROWTH STIM; 23 HR performed by Pamalee Leyden, MD at [facility] MAIN OR       Family History:  Family History   Problem Relation Age of Onset    Diabetes Other 26        parent, NOS    Cancer Father     Hypertension Other 35        parent,NOS    Heart Disease Other 36        parent, NOS    Lung Disease Father        Social History:   Social History     Tobacco Use    Smoking status: Former     Current packs/day: 0.00     Types: Cigarettes, Cigars     Quit date: 11/13/2021     Years since quitting: 1.2    Smokeless tobacco: Never   Vaping Use    Vaping Use: Never used   Substance Use Topics    Alcohol use: Not Currently     Comment: occ    Drug use: Never       Allergies:  Allergies   Allergen Reactions    Lactose Other (See Comments)     Intolerance       PMH, PSH, family history, social history, allergies reviewed with the patient with significant items noted above.  Review of Systems   Review of Systems   Constitutional:  Negative for activity change and fatigue.   HENT:  Negative for congestion and sore throat.    Respiratory:  Negative for shortness of breath.    Cardiovascular:  Negative for chest pain.   Gastrointestinal:  Negative for abdominal pain, diarrhea and vomiting.   Genitourinary:  Negative for dysuria and vaginal discharge.   Musculoskeletal:  Positive for arthralgias (left knee pain) and back pain. Negative for myalgias.   Skin:  Negative for wound.   Neurological:  Negative for dizziness, syncope and headaches.   All other systems reviewed and are negative.      Physical Exam     Vitals:    01/30/23 1123 01/30/23 1126   BP: 138/68    Pulse: 76    Resp: 18    Temp:  97.7 F (36.5 C)   TempSrc:  Oral   SpO2: 100%    Weight: 67.1 kg (148 lb)    Height: 1.499  m (4\' 11" )        Physical Exam  Vitals and nursing note reviewed.   Constitutional:       Appearance: Normal appearance.      Comments: Pt in NAD   HENT:      Head: Normocephalic and atraumatic.   Eyes:      General: No scleral icterus.     Comments: PERRL  EOM intact   Cardiovascular:      Rate and Rhythm: Normal rate and regular rhythm.      Pulses: No decreased pulses.   Pulmonary:      Effort: Pulmonary effort is normal.      Breath sounds: Normal breath sounds and air entry.   Abdominal:      General: There is no distension.      Palpations: Abdomen is soft.      Tenderness: There is no abdominal tenderness.   Musculoskeletal:      Cervical back: Full passive range of motion without pain.      Lumbar back: Tenderness and bony tenderness present.      Right knee: Tenderness present over the medial joint line and lateral joint line.      Left knee: Tenderness present over the medial joint line and lateral joint line.      Right lower leg: No edema.      Left lower leg: No edema.      Comments: DP pulses strong and equal b/l  Sensation equal and intact to lower extremities b/l  Strength 5/5 to lower extremities b/l   Skin:     General: Skin is warm and dry.      Capillary Refill: Capillary refill takes less than 2 seconds.   Neurological:      Mental Status: She is alert and oriented to person, place, and time. Mental status is at baseline.      Comments: A&Ox4  Speech clear  Gait stable  Facial muscles equal and intact  Strength 5/5 in all extremities  Sensation intact in all extremities  (-) pronator drift  No finger to nose dysmetria   Psychiatric:         Attention and Perception: Attention normal.         Diagnostic Study Results     Labs -     Recent Results (from the past 12 hour(s))   EKG 12 Lead    Collection Time: 01/30/23 11:58 AM   Result Value Ref Range    Ventricular Rate 69 BPM    Atrial Rate 69 BPM    P-R Interval 140 ms    QRS Duration 90 ms    Q-T Interval 414 ms    QTc Calculation (Bazett) 443  ms    P Axis 51 degrees    R Axis -9 degrees    T Axis 33 degrees    Diagnosis       Normal sinus rhythm  Anterior infarct (cited on or before 17-Aug-2022)  Abnormal  ECG  When compared with ECG of 11-Dec-2022 00:48,  No significant change was found     CBC with Auto Differential    Collection Time: 01/30/23 12:08 PM   Result Value Ref Range    WBC 5.0 4.6 - 13.2 K/uL    RBC 4.84 4.20 - 5.30 M/uL    Hemoglobin 12.6 12.0 - 16.0 g/dL    Hematocrit 16.1 09.6 - 45.0 %    MCV 79.1 78.0 - 100.0 FL    MCH 26.0 24.0 - 34.0 PG    MCHC 32.9 31.0 - 37.0 g/dL    RDW 04.5 40.9 - 81.1 %    Platelets 187 135 - 420 K/uL    MPV 10.5 9.2 - 11.8 FL    Nucleated RBCs 0.0 0 PER 100 WBC    nRBC 0.00 0.00 - 0.01 K/uL    Neutrophils % 69 40 - 73 %    Lymphocytes % 16 (L) 21 - 52 %    Monocytes % 11 (H) 3 - 10 %    Eosinophils % 3 0 - 5 %    Basophils % 0 0 - 2 %    Immature Granulocytes % 0 0.0 - 0.5 %    Neutrophils Absolute 3.5 1.8 - 8.0 K/UL    Lymphocytes Absolute 0.8 (L) 0.9 - 3.6 K/UL    Monocytes Absolute 0.6 0.05 - 1.2 K/UL    Eosinophils Absolute 0.2 0.0 - 0.4 K/UL    Basophils Absolute 0.0 0.0 - 0.1 K/UL    Immature Granulocytes Absolute 0.0 0.00 - 0.04 K/UL    Differential Type AUTOMATED     Comprehensive Metabolic Panel    Collection Time: 01/30/23 12:08 PM   Result Value Ref Range    Sodium 141 136 - 145 mmol/L    Potassium 4.1 3.5 - 5.5 mmol/L    Chloride 109 100 - 111 mmol/L    CO2 29 21 - 32 mmol/L    Anion Gap 3 3.0 - 18 mmol/L    Glucose 88 74 - 99 mg/dL    BUN 14 7.0 - 18 MG/DL    Creatinine 9.14 0.6 - 1.3 MG/DL    BUN/Creatinine Ratio 18 12 - 20      Est, Glom Filt Rate 87 >60 ml/min/1.24m2    Calcium 10.3 (H) 8.5 - 10.1 MG/DL    Total Bilirubin 0.4 0.2 - 1.0 MG/DL    ALT 782 (H) 13 - 56 U/L    AST 57 (H) 10 - 38 U/L    Alk Phosphatase 135 (H) 45 - 117 U/L    Total Protein 7.0 6.4 - 8.2 g/dL    Albumin 3.5 3.4 - 5.0 g/dL    Globulin 3.5 2.0 - 4.0 g/dL    Albumin/Globulin Ratio 1.0 0.8 - 1.7     Magnesium    Collection  Time: 01/30/23 12:08 PM   Result Value Ref Range    Magnesium 1.5 (L) 1.6 - 2.6 mg/dL   Vascular duplex lower extremity venous left    Collection Time: 01/30/23  1:19 PM   Result Value Ref Range    Body Surface Area 1.67 m2   POCT Glucose    Collection Time: 01/30/23  2:11 PM   Result Value Ref Range    POC Glucose 75 70 - 110 mg/dL   Urinalysis    Collection Time: 01/30/23  2:51 PM   Result Value Ref Range    Color, UA YELLOW      Appearance  CLEAR      Specific Gravity, UA 1.013 1.005 - 1.030      pH, Urine 8.0 5.0 - 8.0      Protein, UA 30 (A) NEG mg/dL    Glucose, Ur Negative NEG mg/dL    Ketones, Urine Negative NEG mg/dL    Bilirubin, Urine Negative NEG      Blood, Urine Negative NEG      Urobilinogen, Urine 1.0 0.2 - 1.0 EU/dL    Nitrite, Urine Negative NEG      Leukocyte Esterase, Urine Negative NEG     Urinalysis, Micro    Collection Time: 01/30/23  2:51 PM   Result Value Ref Range    WBC, UA 0 to 1 0 - 4 /hpf    RBC, UA 0 to 3 0 - 5 /hpf    Epithelial Cells, UA 1+ 0 - 5 /lpf    BACTERIA, URINE FEW (A) NEG /hpf      Labs Reviewed   CBC WITH AUTO DIFFERENTIAL - Abnormal; Notable for the following components:       Result Value    Lymphocytes % 16 (*)     Monocytes % 11 (*)     Lymphocytes Absolute 0.8 (*)     All other components within normal limits   COMPREHENSIVE METABOLIC PANEL - Abnormal; Notable for the following components:    Calcium 10.3 (*)     ALT 112 (*)     AST 57 (*)     Alk Phosphatase 135 (*)     All other components within normal limits   URINALYSIS - Abnormal; Notable for the following components:    Protein, UA 30 (*)     All other components within normal limits   MAGNESIUM - Abnormal; Notable for the following components:    Magnesium 1.5 (*)     All other components within normal limits   URINALYSIS, MICRO - Abnormal; Notable for the following components:    BACTERIA, URINE FEW (*)     All other components within normal limits   POCT GLUCOSE       Radiologic Studies -   CT LUMBAR SPINE WO  CONTRAST   Final Result      No acute fracture or focal malalignment identified. Osteopenia slightly limits   evaluation.      L4-S1 solid interbody fusion. Notable degenerative changes summarized above.      Electronically signed by Migdalia Dk      Vascular duplex lower extremity venous left         XR KNEE LEFT (3 VIEWS)   Final Result   1.  No acute abnormality of the left knee.         Electronically signed by Lucrezia Starch            The laboratory results, imaging results, and other diagnostic exams were reviewed in the EMR.    Medical Decision Making   I am the first provider for this patient.    I reviewed the vital signs, available nursing notes, past medical history, past surgical history, family history and social history.    Vital Signs-Reviewed the patient's vital signs.    EKG: Normal sinus rhythm.  No acute ischemic changes       Records Reviewed: Personally, on initial evaluation    MDM:   DDX includes but is not limited to: Fall, Lumbar strain vs fracture, Knee sprain vs fracture, DVT, UTI, Electrolyte abnormality, Dehydration, DVT    Will  obtain lab work and imaging for further evaluation of patients complaint. Will continue to monitor and evaluate patient while in the ED.      Orders as below:  Orders Placed This Encounter   Procedures    CT LUMBAR SPINE WO CONTRAST    XR KNEE LEFT (3 VIEWS)    CBC with Auto Differential    Comprehensive Metabolic Panel    Urinalysis    Magnesium    Urinalysis, Micro    POCT Glucose    EKG 12 Lead    Insert peripheral IV    Vascular duplex lower extremity venous left        [name] presents with complaint of fall after her knees gave out prior to arrival. Denies hitting head and LOC. Patient reports pain to low back. On exam, TTP to lumbar area. NVI. Lower leg swelling b/l. Hx lymphedema.  Duplex negative for DVT.  X-ray of knee negative for fracture.  CT scan negative for fracture as well.  EKG shows no ischemic changes. Normal creatinine and  electrolytes.  Suspect fall likely related to tripping and given fall precautions.  Spoke with patient's daughter and answered all questions.  Given strict return precautions. At time of discharge, pt non-toxic appearing in NAD. Pt stable for prompt outpatient follow-up with PCP 1 to 2 days.  Patient given strict instructions to return if symptoms worsen.        ED Course:        Procedures:  Procedures         Documentation/Prior Results Review:  Old medical records.  Nursing notes.      Diagnosis and Disposition     CLINICAL IMPRESSION:  1. Fall, initial encounter    2. Strain of lumbar region, initial encounter    3. Contusion of left knee, initial encounter         Medication List        ASK your doctor about these medications      acetaminophen 500 MG tablet  Commonly known as: TYLENOL  Take 1 tablet by mouth every 6 hours as needed for Fever or Pain     aspirin 325 MG tablet     atorvastatin 80 MG tablet  Commonly known as: LIPITOR  Take 1 tablet by mouth nightly     baclofen 10 MG tablet  Commonly known as: LIORESAL     carvedilol 6.25 MG tablet  Commonly known as: COREG  Take 1 tablet by mouth 2 times daily (with meals)     cinacalcet 30 MG tablet  Commonly known as: SENSIPAR     ferrous sulfate 325 (65 Fe) MG tablet  Commonly known as: IRON 325  Take 1 tablet by mouth daily     fluconazole 100 MG tablet  Commonly known as: DIFLUCAN     furosemide 40 MG tablet  Commonly known as: LASIX     hydrALAZINE 25 MG tablet  Commonly known as: APRESOLINE  Take 1 tablet by mouth every 8 hours     insulin glargine 100 UNIT/ML injection vial  Commonly known as: LANTUS  Inject 10 Units into the skin daily     insulin lispro 100 UNIT/ML Soln injection vial  Commonly known as: HUMALOG,ADMELOG  Inject 3 Units into the skin 3 times daily (with meals)     ketorolac 10 MG tablet  Commonly known as: TORADOL  Take 1 tablet by mouth every 6 hours as needed for Pain     lactobacillus capsule  Take 1 capsule by mouth daily (with  breakfast)     Magnesium Oxide 400 MG Caps     nystatin 100000 UNIT/GM powder  Commonly known as: MYCOSTATIN  Apply 3 times daily.     potassium chloride 10 MEQ extended release tablet  Commonly known as: KLOR-CON M     therapeutic multivitamin-minerals tablet  Take 1 tablet by mouth daily     tiZANidine 4 MG tablet  Commonly known as: ZANAFLEX  Take 1 tablet by mouth every 8 hours as needed (Muscle spasm)     vitamin C 500 MG tablet  Commonly known as: ASCORBIC ACID              Disposition: Discharged    Patient condition at time of disposition: Stable    DISCHARGE NOTE:   Pt has been reexamined. Patient has no new complaints, changes, or physical findings.  Care plan outlined and precautions discussed.  Results were reviewed with the patient. All medications were reviewed with the patient. All of pt's questions and concerns were addressed.  Alarm symptoms and return precautions associated with chief complaint and evaluation were reviewed with the patient in detail.  The patient demonstrated adequate understanding.  Patient was instructed to follow up with PCP, as well as given strict return precautions to the ED upon further deterioration. Patient is ready for discharge home.        Dragon Disclaimer     Please note that this dictation was completed with Dragon, the [employer] account planner.  Quite often unanticipated grammatical, syntax, homophones, and other interpretive errors are inadvertently transcribed by the computer software.  Please disregard these errors.  Please excuse any errors that have escaped final proofreading.      [address] PA-C       Joaquim Lai, [place]  01/30/23 2027

## 2023-01-30 NOTE — ED Notes (Signed)
Pt aox4 at discharge. NAD noted. VSS. Pt family to pick her up. Pt wheelchair to lobby. Provided with discharge paperwork and verbalized understanding.

## 2023-01-31 LAB — EKG 12-LEAD
Atrial Rate: 69 {beats}/min
Diagnosis: NORMAL
P Axis: 51 degrees
P-R Interval: 140 ms
Q-T Interval: 414 ms
QRS Duration: 90 ms
QTc Calculation (Bazett): 443 ms
R Axis: -9 degrees
T Axis: 33 degrees
Ventricular Rate: 69 {beats}/min

## 2023-01-31 LAB — VAS DUP LOWER EXTREMITY VENOUS LEFT: Body Surface Area: 1.67 m2

## 2023-02-04 MED ORDER — NYSTATIN 100000 UNIT/GM EX POWD
100000 UNIT/GM | CUTANEOUS | 0 refills | Status: AC
Start: 2023-02-04 — End: ?

## 2023-03-04 ENCOUNTER — Other Ambulatory Visit: Payer: MEDICARE | Primary: Physician Assistant

## 2023-03-04 ENCOUNTER — Encounter

## 2023-03-04 ENCOUNTER — Inpatient Hospital Stay: Admit: 2023-03-04 | Payer: Medicare Other | Primary: Physician Assistant

## 2023-03-04 DIAGNOSIS — I10 Essential (primary) hypertension: Secondary | ICD-10-CM

## 2023-03-04 LAB — CBC WITH AUTO DIFFERENTIAL
Basophils %: 0 % (ref 0–2)
Basophils Absolute: 0 10*3/uL (ref 0.0–0.1)
Eosinophils %: 3 % (ref 0–5)
Eosinophils Absolute: 0.2 10*3/uL (ref 0.0–0.4)
Hematocrit: 38.5 % (ref 35.0–45.0)
Hemoglobin: 12.3 g/dL (ref 12.0–16.0)
Immature Granulocytes %: 0 % (ref 0.0–0.5)
Immature Granulocytes Absolute: 0 10*3/uL (ref 0.00–0.04)
Lymphocytes %: 24 % (ref 21–52)
Lymphocytes Absolute: 1.3 10*3/uL (ref 0.9–3.6)
MCH: 25.2 pg (ref 24.0–34.0)
MCHC: 31.9 g/dL (ref 31.0–37.0)
MCV: 78.9 FL (ref 78.0–100.0)
MPV: 10 FL (ref 9.2–11.8)
Monocytes %: 12 % — ABNORMAL HIGH (ref 3–10)
Monocytes Absolute: 0.7 10*3/uL (ref 0.05–1.2)
Neutrophils %: 60 % (ref 40–73)
Neutrophils Absolute: 3.2 10*3/uL (ref 1.8–8.0)
Nucleated RBCs: 0 /100{WBCs}
Platelets: 192 10*3/uL (ref 135–420)
RBC: 4.88 M/uL (ref 4.20–5.30)
RDW: 14 % (ref 11.6–14.5)
WBC: 5.3 10*3/uL (ref 4.6–13.2)
nRBC: 0 10*3/uL (ref 0.00–0.01)

## 2023-03-04 LAB — LIPID PANEL
Chol/HDL Ratio: 2.3 (ref 0–5.0)
Cholesterol, Total: 135 mg/dL (ref <200)
HDL: 59 mg/dL (ref 40–60)
LDL Cholesterol: 64.2 mg/dL (ref 0–100)
Triglycerides: 59 mg/dL (ref <150)
VLDL Cholesterol Calculated: 11.8 mg/dL

## 2023-03-04 LAB — COMPREHENSIVE METABOLIC PANEL
ALT: 226 U/L — ABNORMAL HIGH (ref 13–56)
AST: 88 U/L — ABNORMAL HIGH (ref 10–38)
Albumin/Globulin Ratio: 1.4 (ref 0.8–1.7)
Albumin: 3.8 g/dL (ref 3.4–5.0)
Alk Phosphatase: 156 U/L — ABNORMAL HIGH (ref 45–117)
Anion Gap: 5 mmol/L (ref 3.0–18)
BUN/Creatinine Ratio: 43 — ABNORMAL HIGH (ref 12–20)
BUN: 32 mg/dL — ABNORMAL HIGH (ref 7.0–18)
CO2: 27 mmol/L (ref 21–32)
Calcium: 9.1 mg/dL (ref 8.5–10.1)
Chloride: 111 mmol/L (ref 100–111)
Creatinine: 0.75 mg/dL (ref 0.6–1.3)
Est, Glom Filt Rate: 88 mL/min/{1.73_m2} (ref >60)
Globulin: 2.7 g/dL (ref 2.0–4.0)
Glucose: 97 mg/dL (ref 74–99)
Potassium: 4.4 mmol/L (ref 3.5–5.5)
Sodium: 143 mmol/L (ref 136–145)
Total Bilirubin: 0.4 mg/dL (ref 0.2–1.0)
Total Protein: 6.5 g/dL (ref 6.4–8.2)

## 2023-03-04 LAB — HEMOGLOBIN A1C
Estimated Avg Glucose: 157 mg/dL
Hemoglobin A1C: 7.1 % — ABNORMAL HIGH (ref 4.2–5.6)

## 2023-03-04 LAB — MAGNESIUM: Magnesium: 1.4 mg/dL — ABNORMAL LOW (ref 1.6–2.6)

## 2023-03-09 ENCOUNTER — Ambulatory Visit: Payer: MEDICARE | Attending: Pulmonary Disease | Primary: Physician Assistant

## 2023-03-13 ENCOUNTER — Ambulatory Visit: Payer: MEDICARE | Attending: Specialist | Primary: Physician Assistant
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
## 2023-03-18 ENCOUNTER — Ambulatory Visit (INDEPENDENT_AMBULATORY_CARE_PROVIDER_SITE_OTHER): Payer: Medicare Other | Admitting: Podiatry

## 2023-03-18 ENCOUNTER — Ambulatory Visit
Admit: 2023-03-18 | Discharge: 2023-03-18 | Payer: MEDICARE | Attending: Pulmonary Disease | Primary: Physician Assistant

## 2023-03-18 DIAGNOSIS — R918 Other nonspecific abnormal finding of lung field: Secondary | ICD-10-CM

## 2023-03-18 DIAGNOSIS — M79675 Pain in left toe(s): Secondary | ICD-10-CM | POA: Diagnosis not present

## 2023-03-18 DIAGNOSIS — M79674 Pain in right toe(s): Secondary | ICD-10-CM

## 2023-03-18 DIAGNOSIS — B351 Tinea unguium: Secondary | ICD-10-CM | POA: Diagnosis not present

## 2023-03-18 DIAGNOSIS — E1142 Type 2 diabetes mellitus with diabetic polyneuropathy: Secondary | ICD-10-CM

## 2023-03-18 NOTE — Progress Notes (Signed)
[name] Short presents today for   Chief Complaint   Patient presents with    Follow-up     Other nonspecific abnormal finding of lung field       Is someone accompanying this pt? Daughter    Is the patient using any DME equipment during OV? No    -DME Company NA    Depression Screening:        09/24/2021     3:10 PM   PHQ-9 Questionaire   Little interest or pleasure in doing things 0   Feeling down, depressed, or hopeless 0   PHQ-9 Total Score 0       Learning Needs Questionnaire:     Who is the primary learner? Patient    What is the preferred language for health care of the primary learner? ENGLISH    How does the primary learner prefer to learn new concepts? DEMONSTRATION    Answered By Patient    Relationship to Learner SELF          Fall Risk:         09/24/2021     3:10 PM   Fall Risk   Do you feel unsteady or are you worried about falling?  yes   2 or more falls in past year? yes   Fall with injury in past year? no        Abuse Screening:         09/24/2021     3:00 PM   AMB Abuse Screening   Do you ever feel afraid of your partner? N   Are you in a relationship with someone who physically or mentally threatens you? N   Is it safe for you to go home? Y         Coordination of Care:    1. Have you been to the ER, urgent care clinic since your last visit? Hospitalized since your last visit? Multiple ED Visits for pain/fall    2. Have you seen or consulted any other health care providers outside of the [facility] since your last visit? Include any pap smears or colon screening. PCP    Medication list has been update per patient.

## 2023-03-18 NOTE — Progress Notes (Signed)
[name] Short (DOB:  [birth year]) is a 67 y.o. female,Established patient, here for evaluation of the following chief complaint(s):  Follow-up (Other nonspecific abnormal finding of lung field)         Assessment & Plan  Lung nodules    No indication for CT follow up specifically for lung nodules as these are likely benign.  Will perform annual LDCT screening in light of smoking history.       Personal history of tobacco use       Orders:    PR VISIT TO DISCUSS LUNG CA SCREEN W LDCT    CT Lung Screen (Initial/Annual/Baseline); Future  Will call pt with LDCT results  Further intervention pending results.    Return in about 1 year (around 03/17/2024).       Subjective   Pt is a former smoker with incidental note of a lung nodule on a CT of the abdomen in April 2022. Dedicated CT done in May confirmed R lung nodule with other small nodules. Follow up CT Dec 2022 showed stable nodules, see below. She was lost to follow up since 2022.    Pt denies shortness of breath or wheezing. Per pt's daughter, pt has had a cough with tenacious clear phlegm for years which resolved since the last visit. She has no chest pain or hemoptysis. No new respiratory symptoms.    Pt has a 34 PY smoking history and quit smoking in 2023.          Review of Systems   Constitutional:  Positive for activity change. Negative for appetite change, chills, diaphoresis, fatigue, fever and unexpected weight change.   HENT:  Negative for congestion, facial swelling, hearing loss, sneezing, sore throat, tinnitus, trouble swallowing and voice change.    Eyes:  Negative for photophobia, pain, discharge, redness, itching and visual disturbance.   Respiratory:  Negative for apnea, cough, choking, chest tightness, shortness of breath, wheezing and stridor.    Cardiovascular:  Negative for chest pain, palpitations and leg swelling.   Gastrointestinal:  Negative for abdominal pain, blood in stool, nausea and vomiting.   Endocrine: Negative for cold intolerance,  heat intolerance, polydipsia, polyphagia and polyuria.   Genitourinary:  Negative for dysuria, frequency and urgency.   Musculoskeletal:  Positive for arthralgias (B knees), back pain and neck pain. Negative for joint swelling and myalgias.   Skin:  Negative for color change, pallor, rash and wound.   Allergic/Immunologic: Negative for environmental allergies, food allergies and immunocompromised state.   Neurological:  Negative for tremors, seizures, syncope, weakness and headaches.   Hematological:  Negative for adenopathy. Does not bruise/bleed easily.   Psychiatric/Behavioral:  Negative for confusion, hallucinations, self-injury, sleep disturbance and suicidal ideas. The patient is not nervous/anxious.         Past Medical History:   Diagnosis Date    Anemia     Arthritis     Chronic pain     Legs and Shoulders    Diabetes (HCC)     Exposure to asbestos     History of blood transfusion     History of colon polyps     HTN (hypertension)     Hx of blood clots     DVT    Hyperlipemia     Menopause     Pulmonary nodule     Serum calcium elevated     Sleep apnea     not using cpap    Stroke [facility])      Past Surgical  History:   Procedure Laterality Date    BACK SURGERY N/A 06/24/2022    INCISION AND DRAINAGE /DEBRIDEMENT SACRAL DECUBITUS performed by Phineas Semen, DO at [facility] MAIN OR    CESAREAN SECTION  1987    COLONOSCOPY N/A 01/13/2017    COLONOSCOPY performed by Levan Hurst, MD at [facility]COPY    COLONOSCOPY      DILATION AND CURETTAGE OF UTERUS  1995    RECTAL SURGERY N/A 12/28/2021    SACRAL WOUND DEBRIDEMENT INCISION AND DRAINAGE performed by Phineas Semen, DO at [facility] MAIN OR    SPINE SURGERY N/A 12/09/2021    CERVICAL THREE/FOUR/FIVE/SIX LAMINECTOMY FUSION; C-ARM; STRYKER; EXT BONE GROWTH STIM; 23 HR performed by Pamalee Leyden, MD at [facility] MAIN OR     Current Outpatient Medications on File Prior to Visit   Medication Sig Dispense Refill    famotidine (PEPCID) 20 MG tablet TAKE 1 TABLET BY MOUTH ONCE A DAY  FOR GERD      nystatin (MYCOSTATIN) 100000 UNIT/GM powder Apply 3 times daily. 30 g 0    vitamin C (ASCORBIC ACID) 500 MG tablet Take 1 tablet by mouth daily      insulin glargine (LANTUS) 100 UNIT/ML injection vial Inject 10 Units into the skin daily 1 each 0    insulin lispro (HUMALOG) 100 UNIT/ML SOLN injection vial Inject 3 Units into the skin 3 times daily (with meals) 1 each 0    hydrALAZINE (APRESOLINE) 25 MG tablet Take 1 tablet by mouth every 8 hours 90 tablet 3    potassium chloride (KLOR-CON M) 10 MEQ extended release tablet Take 1 tablet by mouth daily      cinacalcet (SENSIPAR) 30 MG tablet Take 1 tablet by mouth 2 times daily      carvedilol (COREG) 6.25 MG tablet Take 1 tablet by mouth 2 times daily (with meals) 60 tablet 3    baclofen (LIORESAL) 10 MG tablet Take 1 tablet by mouth 3 times daily as needed (PAIN) Indications: Pain FOR PAIN PER SHETH      Magnesium Oxide 400 MG CAPS Take 1 capsule by mouth daily.      aspirin 325 MG tablet Take 1 tablet by mouth daily      acetaminophen (TYLENOL) 500 MG tablet Take 1 tablet by mouth every 6 hours as needed for Fever or Pain 30 tablet 0    atorvastatin (LIPITOR) 80 MG tablet Take 1 tablet by mouth nightly 30 tablet 3    ferrous sulfate (IRON 325) 325 (65 Fe) MG tablet Take 1 tablet by mouth daily 30 tablet     Multiple Vitamins-Minerals (THERAPEUTIC MULTIVITAMIN-MINERALS) tablet Take 1 tablet by mouth daily      furosemide (LASIX) 40 MG tablet furosemide 40 mg tablet   TAKE 1 TABLET BY MOUTH EVERY DAY      lisinopril (PRINIVIL;ZESTRIL) 40 MG tablet Take 1 tablet by mouth daily (Patient not taking: Reported on 03/18/2023)      pantoprazole (PROTONIX) 40 MG tablet Pantoprazole (Sodium) Oral Delayed Release    twice daily    inactive (Patient not taking: Reported on 03/18/2023)      traMADol (ULTRAM) 50 MG tablet Take 1 tablet by mouth every 6 hours as needed. Max Daily Amount: 200 mg (Patient not taking: Reported on 03/18/2023)      tiZANidine (ZANAFLEX) 4 MG  tablet Take 1 tablet by mouth every 8 hours as needed (Muscle spasm) (Patient not taking: Reported on 03/18/2023) 15 tablet 0  ketorolac (TORADOL) 10 MG tablet Take 1 tablet by mouth every 6 hours as needed for Pain (Patient not taking: Reported on 03/18/2023) 20 tablet 0    fluconazole (DIFLUCAN) 100 MG tablet Take 100 mg by mouth daily. (Patient not taking: Reported on 03/18/2023)      lactobacillus (CULTURELLE) capsule Take 1 capsule by mouth daily (with breakfast) (Patient not taking: Reported on 03/18/2023) 60 capsule 0     No current facility-administered medications on file prior to visit.     Allergies   Allergen Reactions    Lactose Other (See Comments)     Intolerance     Family History   Problem Relation Age of Onset    Diabetes Other 47        parent, NOS    Cancer Father     Hypertension Other 35        parent,NOS    Heart Disease Other 24        parent, NOS    Lung Disease Father      Social History     Socioeconomic History    Marital status: Single     Spouse name: Not on file    Number of children: Not on file    Years of education: Not on file    Highest education level: Not on file   Occupational History    Not on file   Tobacco Use    Smoking status: Former     Current packs/day: 0.00     Types: Cigarettes, Cigars     Quit date: 11/13/2021     Years since quitting: 1.3    Smokeless tobacco: Never   Vaping Use    Vaping status: Never Used   Substance and Sexual Activity    Alcohol use: Not Currently     Comment: occ    Drug use: Never    Sexual activity: Not on file   Other Topics Concern    Not on file   Social History Narrative    Not on file     Social Determinants of Health     Financial Resource Strain: Low Risk  (01/05/2022)    Overall Financial Resource Strain (CARDIA)     Difficulty of Paying Living Expenses: Not hard at all   Food Insecurity: Food Insecurity Present (08/24/2022)    Hunger Vital Sign     Worried About Running Out of Food in the Last Year: Sometimes true     Ran Out of Food in  the Last Year: Sometimes true   Transportation Needs: No Transportation Needs (10/10/2022)    OASIS A1250: Transportation     Lack of Transportation (Medical): No     Lack of Transportation (Non-Medical): No     Patient Unable or Declines to Respond: No   Physical Activity: Inactive (01/05/2022)    Exercise Vital Sign     Days of Exercise per Week: 0 days     Minutes of Exercise per Session: 0 min   Stress: No Stress Concern Present (01/05/2022)    [employer] of Occupational Health - Occupational Stress Questionnaire     Feeling of Stress : Not at all   Social Connections: Feeling Socially Integrated (10/10/2022)    OASIS D0700: Social Isolation     Frequency of experiencing loneliness or isolation: Never   Intimate Partner Violence: Not At Risk (01/05/2022)    Humiliation, Afraid, Rape, and Kick questionnaire     Fear of Current or Ex-Partner: No  Emotionally Abused: No     Physically Abused: No     Sexually Abused: No   Housing Stability: Low Risk  (08/24/2022)    Housing Stability Vital Sign     Unable to Pay for Housing in the Last Year: No     Number of Places Lived in the Last Year: 1     Unstable Housing in the Last Year: No       Objective   Blood pressure (!) 151/65, pulse 67, temperature 97.9 F (36.6 C), temperature source Temporal, resp. rate 16, weight 70 kg (154 lb 6.4 oz), SpO2 98%.   Physical Exam  Constitutional:       General: She is not in acute distress.     Appearance: She is not ill-appearing, toxic-appearing or diaphoretic.      Comments: Walker use   HENT:      Head: Normocephalic and atraumatic.      Right Ear: External ear normal.      Left Ear: External ear normal.      Nose: Nose normal. No congestion.      Mouth/Throat:      Mouth: Mucous membranes are moist.      Pharynx: Oropharynx is clear. No oropharyngeal exudate or posterior oropharyngeal erythema.   Eyes:      General: No scleral icterus.        Right eye: No discharge.         Left eye: No discharge.      Conjunctiva/sclera:  Conjunctivae normal.      Pupils: Pupils are equal, round, and reactive to light.   Neck:      Vascular: No carotid bruit.      Comments: Surgical scar posterior base of neck  Cardiovascular:      Rate and Rhythm: Normal rate and regular rhythm.      Pulses: Normal pulses.      Heart sounds: Normal heart sounds. No murmur heard.     No gallop.   Pulmonary:      Effort: Pulmonary effort is normal. No respiratory distress.      Breath sounds: Normal breath sounds. No stridor. No wheezing, rhonchi or rales.   Chest:      Chest wall: No tenderness.   Abdominal:      Palpations: Abdomen is soft. There is no mass.      Tenderness: There is no abdominal tenderness.   Musculoskeletal:         General: No swelling, tenderness, deformity or signs of injury.      Cervical back: No rigidity or tenderness.      Right lower leg: No edema.      Left lower leg: No edema.   Lymphadenopathy:      Cervical: No cervical adenopathy.   Skin:     General: Skin is warm and dry.      Coloration: Skin is not jaundiced or pale.      Findings: No bruising, erythema, lesion or rash.   Neurological:      General: No focal deficit present.      Mental Status: She is alert and oriented to person, place, and time.      Gait: Gait abnormal (walker use).             CT Result (most recent):  CT LUMBAR SPINE WO CONTRAST 01/30/2023    Narrative  EXAMINATION: CT lumbar spine without contrast    INDICATION: Trauma, fall, back pain    COMPARISON:  None    TECHNIQUE: CT lumbar spine without contrast. All CT scans at this facility are  performed using dose optimization technique as appropriate to a performed exam,  to include automated exposure control, adjustment of the mA and/or kV according  to patient size (including appropriate matching first site specific  examinations), or use of iterative reconstruction technique.    FINDINGS:    General: Osteopenia limits evaluation of bony detail. No significant listhesis.  Lumbar lordosis maintained. L4-S1 solid  interbody fusion, otherwise multilevel  disc base loss. Vertebral body heights preserved. Multilevel advanced lumbar  facet arthropathy. No acute fracture identified.    Miscellaneous: Atherosclerotic calcifications. Incompletely imaged suspected  bladder distention.    Levels:  -Multilevel mild disc herniations. L4-L5 and L5-S1 notable disc/osteophyte  bulges.  -Multifocal facet arthropathy, most pronounced at L3-S1.  -Likely mild to moderate spinal canal stenoses at L2-S1, most pronounced at  L4-L5.  -Foraminal stenoses most notable at bilateral T10-T11, bilateral T11-T12, right  T12-L1, right L4-L5, right L5-S1.    Impression  No acute fracture or focal malalignment identified. Osteopenia slightly limits  evaluation.    L4-S1 solid interbody fusion. Notable degenerative changes summarized above.    Electronically signed by Migdalia Dk            CT CHEST WO CONTRAST  Order: [id number]  Status: Final result       Visible to patient: Yes (seen)       Next appt: Today at 10:45 AM in Pulmonology Tacey Ruiz Trinna Balloon, MD)       Dx: Right upper lobe pulmonary nodule    0 Result Notes       1 HM Topic  Details    Reading Physician Reading Date Result Priority   Lester Kinsman, MD  [phone] 12/03/2022    Herma Mering, MD  [phone] 12/03/2022      Narrative & Impression  EXAM: CT CHEST WO CONTRAST.     CLINICAL INDICATION/HISTORY: Solitary pulmonary nodule.     COMPARISON: CT abdomen pelvis 10/05/2022. CT chest 07/22/2021. CT chest 12/11/2020.     TECHNIQUE: Unenhanced CT imaging of the chest was performed, after which  sagittal and coronal reformats were rendered from axial source images.     One or more dose reduction techniques were used on this CT: automated exposure  control, adjustment of the mAs and/or kVp according to patient size, and  iterative reconstruction techniques. The specific techniques used on this CT  exam have been documented in the patient's electronic medical record. Digital  Imaging and  Communications in Medicine (DICOM) format image data are available  to nonaffiliated external healthcare facilities or entities on a secure, media  free, reciprocally searchable basis with patient authorization for at least a  34-month period after this study.  _______________        FINDINGS:     Lungs/Pleura: There is no focal airspace consolidation, pneumothorax, or pleural  effusion. The 1 cm right lower lobe nodule noted on recent CT abdomen pelvis is  no longer present. Redemonstrated 7 mm nodule in the right middle lobe, stable  since May 2022. Redemonstrated 8 mm nodule in the left upper lobe is stable  since May 2022. No new suspicious pulmonary nodules or masses.     Central Airways: Patent.      Vessels: Aortic atherosclerotic changes without aneurysm.     Heart/pericardium: Heart size is normal. No pericardial effusion. Coronary  artery calcifications are present.     Mediastinum/hila:  Within normal limits.     Chest wall/lower neck: Within normal limits.     Axilla: Within normal limits.     Upper abdomen: Limited visualization of the upper abdominal contents reveals no  significant acute findings.     Osseous structures: Degenerative changes with no acute or suspicious osseous  findings.     _______________     IMPRESSION:        1. Interval resolution of 1 cm right lower lobe nodule noted on CT abdomen  pelvis in March 2024.     2. 7 mm right middle lobe and 8 mm left upper lobe nodules have been stable  since May 2022, very likely benign, no further follow-up is needed.         Maylon Peppers MD  [facility]     All findings discussed with attending radiologist. I have personally reviewed  all images and agree with the interpretation above.              Exam Ended: 12/02/22 15:32 EDT Last Resulted: 12/03/22 11:38 EDT                           An electronic signature was used to authenticate this note.    --Isabell Jarvis, MD   Discussed with the patient the current USPSTF guidelines  released October 11, 2019 for screening for lung cancer.    For adults aged 68 to 43 years who have a 20 pack-year smoking history and currently smoke or have quit within the past 15 years the grade B recommendation is to:  Screen for lung cancer with low-dose computed tomography (LDCT) every year.  Stop screening once a person has not smoked for 15 years or has a health problem that limits life expectancy or the ability to have lung surgery.    The patient  reports that she quit smoking about 16 months ago. Her smoking use included cigarettes and cigars. She has never used smokeless tobacco.. Discussed with patient the risks and benefits of screening, including over-diagnosis, false positive rate, and total radiation exposure.  The patient currently exhibits no signs or symptoms suggestive of lung cancer.  Discussed with patient the importance of compliance with yearly annual lung cancer screenings and willingness to undergo diagnosis and treatment if screening scan is positive.  In addition, the patient was counseled regarding the importance of remaining smoke free and/or total smoking cessation.    Also reviewed the following if the patient has Medicare that as of September 13, 2020, Medicare only covers LDCT screening in patients aged 50-77 with at least a 20 pack-year smoking history who currently smoke or have quit in the last 15 years

## 2023-03-18 NOTE — Patient Instructions (Signed)
Learning About Lung Cancer Screening  What is screening for lung cancer?     Lung cancer screening is a way to find some lung cancers early, before a person has any symptoms of the cancer.  Lung cancer screening may help those who have the highest risk for lung cancer--people age 65 and older who are or were heavy smokers. For most people, who aren't at increased risk, screening for lung cancer probably isn't helpful.  Screening won't prevent cancer. And it may not find all lung cancers. Lung cancer screening may lower the risk of dying from lung cancer in a small number of people.  How is it done?  Lung cancer screening is done with a low-dose CT (computed tomography) scan. A CT scan uses X-rays, or radiation, to make detailed pictures of your body. Experts recommend that screening be done in medical centers that focus on finding and treating lung cancer.  Who is screening recommended for?  Lung cancer screening is recommended for people age 65 and older who are or were heavy smokers. That means people with a smoking history of at least 20 pack years. A pack year is a way to measure how heavy a smoker you are or were.  To figure out your pack years, multiply how many packs a day on average (assuming 20 cigarettes per pack) you have smoked by how many years you have smoked. For example:  If you smoked 1 pack a day for 20 years, that's 1 times 20. So you have a smoking history of 20 pack years.  If you smoked 2 packs a day for 10 years, that's 2 times 10. So you have a smoking history of 20 pack years.  Experts agree that screening is for people who have a high risk of lung cancer. But experts don't agree on what high risk means. Some say people age 65 or older with at least a 20-pack-year smoking history are high risk. Others say it's people age 55 or older with a 30-pack-year history.  To see if you could benefit from screening, first find out if you are at high risk for lung cancer. Your doctor can help you  decide your lung cancer risk.  What are the risks of screening?  CT screening for lung cancer isn't perfect. It can show an abnormal result when it turns out there wasn't any cancer. This is called a false-positive result. This means you may need more tests to make sure you don't have cancer. These tests can be harmful and cause a lot of worry.  These tests may include more CT scans and invasive testing like a lung biopsy. In a biopsy, the doctor takes a sample of tissue from inside your lung so it can be looked at under a microscope. A biopsy is the only way to tell if you have lung cancer. If the biopsy finds cancer, you and your doctor will have to decide how or whether to treat it.  Some lung cancers found on CT scans are harmless and would not have caused a problem if they had not been found through screening. But because doctors can't tell which ones will turn out to be harmless, most will be treated. This means that you may get treatment--including surgery, radiation, or chemotherapy--that you don't need.  There is a risk of damage to cells or tissue from being exposed to radiation, including the small amounts used in CTs, X-rays, and other medical tests. Over time, exposure to radiation may cause cancer   and other health problems. But in most cases, the risk of getting cancer from being exposed to small amounts of radiation is low. It's not a reason to avoid these tests for most people.  What are the benefits of screening?  Your scan may be normal (negative).  For some people who are at higher risk, screening lowers the chance of dying of lung cancer. How much and how long you smoked helps to determine your risk level. Screening can find some cancers early, when treatment may be more likely to work.  What happens after screening?  The results of your CT scan will be sent to your doctor. Someone from your care team will explain the results of your scan and answer any questions you may have. If you need any  follow-up, he or she will help you understand what to do next.  After a lung cancer screening, you can go back to your usual activities right away.  A lung cancer screening test can't tell if you have lung cancer. If your results are positive, your doctor can't tell whether an abnormal finding is a harmless nodule, cancer, or something else without doing more tests.  What can you do to help prevent lung cancer?  Some lung cancers can't be prevented. But if you smoke, quitting smoking is the best step you can take to prevent lung cancer. If you want to quit, your doctor can recommend medicines or other ways to help.  Follow-up care is a key part of your treatment and safety. Be sure to make and go to all appointments, and call your doctor if you are having problems. It's also a good idea to know your test results and keep a list of the medicines you take.  Where can you learn more?  Go to https://www.healthwise.net/patientEd and enter Q940 to learn more about "Learning About Lung Cancer Screening."  Current as of: May 28, 2022  Content Version: 14.1   2006-2024 [employer].   Care instructions adapted under license by [facility]. If you have questions about a medical condition or this instruction, always ask your healthcare professional. [employer] disclaims any warranty or liability for your use of this information.

## 2023-03-23 ENCOUNTER — Encounter: Payer: Self-pay | Admitting: Podiatry

## 2023-03-23 ENCOUNTER — Ambulatory Visit: Admit: 2023-03-23 | Discharge: 2023-03-23 | Payer: MEDICARE | Attending: Specialist | Primary: Physician Assistant

## 2023-03-23 DIAGNOSIS — M25561 Pain in right knee: Secondary | ICD-10-CM

## 2023-03-23 DIAGNOSIS — G8929 Other chronic pain: Secondary | ICD-10-CM
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
## 2023-03-23 NOTE — Progress Notes (Signed)
  Subjective:  Patient ID: Julie Elliott, female    DOB: 11-Jul-1956,  MRN: [id number]  Julie Elliott presents to clinic today for at risk foot care with history of diabetic neuropathy and callus(es) left foot and painful thick toenails that are difficult to trim. Painful toenails interfere with ambulation. Aggravating factors include wearing enclosed shoe gear. Pain is relieved with periodic professional debridement. Painful calluses are aggravated when weightbearing with and without shoegear. Pain is relieved with periodic professional debridement.   She is accompanied by her son on today's visit. Patient is now using ginger oil and moisturizing lotion to moisturize her feet daily.  New problem(s): None.   PCP is Hassan Rowan, [place], [place].  Allergies  Allergen Reactions   Loratadine-Pseudoephedrine Er Hives    Claritin-D    Review of Systems: Negative except as noted in the HPI.  Objective:  There were no vitals filed for this visit. Julie Elliott is a pleasant 67 y.o. female in NAD. AAO x 3.  Neurovascular Examination: CFT <3 seconds b/l LE. Palpable DP pulse(s) b/l LE. Palpable PT pulse(s) b/l LE. Pedal hair absent. No pain with calf compression b/l. Lower extremity skin temperature gradient within normal limits. Nonpitting edema noted BLE. No cyanosis or clubbing noted b/l LE.  Pt has subjective symptoms of neuropathy. Protective sensation intact 5/5 intact bilaterally with 10g monofilament b/l. Vibratory sensation intact b/l.  Dermatological:  Pedal skin is warm and supple b/l LE. oenails 1-5 bilaterally elongated, discolored, dystrophic, thickened, and crumbly with subungual debris and tenderness to dorsal palpation.   No interdigital macerations noted b/l LE.  Multiple nevi noted b/l LE, medial aspect right foot and medial aspect right 5th digit.   Minimal hyperkeratotic lesion submet head 5 left foot.  Musculoskeletal:  Muscle strength 5/5 to all lower extremity muscle groups  bilaterally. No pain, crepitus or joint limitation noted with ROM bilateral LE. No gross bony deformities bilaterally.  Assessment/Plan: 1. Pain due to onychomycosis of toenails of both feet   2. Diabetic peripheral neuropathy associated with type 2 diabetes mellitus (HCC)     -Patient was evaluated and treated. All patient's and/or POA's questions/concerns answered on today's visit. -Continue foot and shoe inspections daily. Monitor blood glucose per PCP/Endocrinologist's recommendations. -Patient to continue soft, supportive shoe gear daily. -Toenails 1-5 b/l were debrided in length and girth with sterile nail nippers and dremel without iatrogenic bleeding.  -Patient/POA to call should there be question/concern in the interim.   Return in about 3 months (around 06/18/2023).  Freddie Breech, DPM

## 2023-04-01 NOTE — Progress Notes (Signed)
 Instructions for your procedure at [facility]      Today's Date: 04/01/2023      Patient's Name: [name] Short      Procedure Date: 9/4        Please enter the main entrance of the hospital and check-in at the front desk located in the lobby.      Do NOT eat or drink anything, including candy, gum, or ice chips after midnight prior to your procedure, unless it is part of your prep.  Brush your teeth before coming to the hospital.You may swish with water , but do not swallow.  No smoking/Vaping/E-Cigarettes 24 hours prior to the day of procedure.  No alcohol 24 hours prior to the day of procedure.  No recreational drugs for one week prior to the day of procedure.  Bring Photo ID, Insurance information, and Co-pay if required on day of procedure.  Bring in pertinent legal documents, such as, Medical Power of [place], DNR, Advance Directive, etc.  Leave all other valuables, including money/purse, at home.  Remove jewelry, including ALL body piercings, nail polish, acrylic nails, and makeup (including mascara); no lotions, powders, deodorant, and/or perfume/cologne/after shave on the skin.  Glasses and dentures may be worn to the hospital.  They must be removed prior to procedure. Please bring case/container for glasses or dentures.  11. Contacts should not be worn on day of procedure.   12. Call the office [phone]) if you have symptoms of a cold or illness within 24-48 hours prior to your procedure.   13. AN ADULT (relative or friend 18 years or older) MUST DRIVE YOU HOME AFTER YOUR PROCEDURE.   14. Please make arrangements for a responsible adult (18 years or older) to be with you for 24 hours after your procedure.   15. TWO VISITORS will be allowed in the waiting area during your procedure.       Special Instructions:      Bring list of CURRENT medications.  Follow instructions from the office regarding Bowel Prep, Vitamins, Iron , Blood Thinners, Insulin , Seizure, and Blood  Pressure/Heart medications.  Bring inhaler.  Bring CPAP machine.    Your Pacemaker/AICD needs to be interrogated within 6 months of your procedure. If not interrogated within this timeframe, your procedure will be canceled.    If you have a history of recreational drug use, you may be required to submit a urine sample for drug testing the day of your procedure, as some recreational drugs can interact with anesthetics and increase your surgical risk.    Any questions regarding prep, please call the office at [phone].    For any questions or concerns on the day of procedure, please call the Endo Suite at [phone].    These surgical instructions were reviewed with the patient during the PAT phone call.

## 2023-04-03 ENCOUNTER — Ambulatory Visit: Payer: MEDICARE | Primary: Physician Assistant

## 2023-04-03 ENCOUNTER — Encounter

## 2023-04-03 ENCOUNTER — Inpatient Hospital Stay: Admit: 2023-04-03 | Payer: MEDICARE | Primary: Physician Assistant

## 2023-04-03 DIAGNOSIS — Z8614 Personal history of Methicillin resistant Staphylococcus aureus infection: Secondary | ICD-10-CM

## 2023-04-08 ENCOUNTER — Inpatient Hospital Stay: Payer: Medicare (Managed Care) | Attending: Gastroenterology

## 2023-04-08 LAB — POCT GLUCOSE
POC Glucose: 86 mg/dL (ref 70–110)
POC Glucose: 68 mg/dL — ABNORMAL LOW (ref 70–110)
POC Glucose: 72 mg/dL (ref 70–110)

## 2023-04-08 MED ORDER — DEXTROSE 10 % IV BOLUS
INTRAVENOUS | Status: DC | PRN
Start: 2023-04-08 — End: 2023-04-08

## 2023-04-08 MED ORDER — LIDOCAINE HCL (PF) 1 % IJ SOLN
1 | Freq: Once | INTRAMUSCULAR | Status: DC | PRN
Start: 2023-04-08 — End: 2023-04-08

## 2023-04-08 MED ORDER — GLUCOSE 4 G PO CHEW
4 | ORAL | Status: DC | PRN
Start: 2023-04-08 — End: 2023-04-08

## 2023-04-08 MED ORDER — SIMETHICONE 40 MG/0.6ML PO SUSP
40 | ORAL | Status: DC | PRN
Start: 2023-04-08 — End: 2023-04-08
  Administered 2023-04-08: 12:00:00 40

## 2023-04-08 MED ORDER — NORMAL SALINE FLUSH 0.9 % IV SOLN
0.9 | INTRAVENOUS | Status: DC | PRN
Start: 2023-04-08 — End: 2023-04-08

## 2023-04-08 MED ORDER — PROPOFOL 200 MG/20ML IV EMUL
200 | INTRAVENOUS | Status: AC
Start: 2023-04-08 — End: ?

## 2023-04-08 MED ORDER — LIDOCAINE HCL (PF) 2 % IJ SOLN
2 | INTRAMUSCULAR | Status: AC
Start: 2023-04-08 — End: ?

## 2023-04-08 MED ORDER — FAMOTIDINE 20 MG PO TABS
20 | Freq: Once | ORAL | Status: DC
Start: 2023-04-08 — End: 2023-04-08

## 2023-04-08 MED ORDER — LIDOCAINE HCL (PF) 2 % IJ SOLN
2 | INTRAMUSCULAR | Status: DC | PRN
Start: 2023-04-08 — End: 2023-04-08
  Administered 2023-04-08: 12:00:00 50 via INTRAVENOUS

## 2023-04-08 MED ORDER — PROPOFOL 200 MG/20ML IV EMUL
200 | INTRAVENOUS | Status: DC | PRN
Start: 2023-04-08 — End: 2023-04-08
  Administered 2023-04-08 (×2): 25 via INTRAVENOUS
  Administered 2023-04-08 (×2): 30 via INTRAVENOUS
  Administered 2023-04-08: 12:00:00 25 via INTRAVENOUS
  Administered 2023-04-08: 12:00:00 50 via INTRAVENOUS

## 2023-04-08 MED ORDER — DEXTROSE 10 % IV SOLN
10 | INTRAVENOUS | Status: DC | PRN
Start: 2023-04-08 — End: 2023-04-08

## 2023-04-08 MED ORDER — LACTATED RINGERS IV SOLN
INTRAVENOUS | Status: DC
Start: 2023-04-08 — End: 2023-04-08
  Administered 2023-04-08: 12:00:00 via INTRAVENOUS

## 2023-04-08 MED ORDER — INSULIN LISPRO 100 UNIT/ML IJ SOLN
100 | Freq: Once | INTRAMUSCULAR | Status: DC
Start: 2023-04-08 — End: 2023-04-08

## 2023-04-08 MED FILL — DIPRIVAN 200 MG/20ML IV EMUL: 200 MG/20ML | INTRAVENOUS | Qty: 20

## 2023-04-08 MED FILL — LACTATED RINGERS IV SOLN: INTRAVENOUS | Qty: 1000

## 2023-04-08 MED FILL — XYLOCAINE-MPF 2 % IJ SOLN: 2 % | INTRAMUSCULAR | Qty: 5

## 2023-04-08 NOTE — Discharge Instructions (Signed)
 Learning About Colonoscopy  What is a colonoscopy?     A colonoscopy is a test (also called a procedure) that lets a doctor look inside your large intestine. The doctor uses a thin, lighted tube called a colonoscope. The doctor uses it to look for small growths called polyps, colon or rectal cancer (colorectal cancer), or other problems like bleeding.  During the procedure, the doctor can take samples of tissue. The samples can then be checked for cancer or other conditions. The doctor can also take out polyps.  How is a colonoscopy done?  This procedure is done in a doctor's office or a clinic or hospital. You will get medicine to help you relax and not feel pain. Some people find that they don't remember having the test because of the medicine.  The doctor gently moves the colonoscope, or scope, through the colon. The scope is also a small video camera. It lets the doctor see the colon and take pictures.  How do you prepare for the procedure?  You need to clean out your colon before the procedure so the doctor can see your colon. This depends on which "colon prep" your doctor recommends.  To clean out your colon, you'll do a "colon prep" before the test. This means you stop eating solid foods and drink only clear liquids. You can have water , tea, coffee, clear juices, clear broths, flavored ice pops, and gelatin (such as Jell-O). Do not drink anything red or purple.  The day or night before the procedure, you drink a large amount of a special liquid. This causes loose, frequent stools. You will go to the bathroom a lot. Your doctor may have you drink part of the liquid the evening before and the rest on the day of the test. It's very important to drink all of the liquid. If you have problems drinking it, call your doctor.  Arrange to have someone take you home after the test.  What can you expect after a colonoscopy?  Your doctor will tell you when you can eat and do your usual activities.  Drink a lot of  fluid after the test to replace the fluids you may have lost during the colon prep. But don't drink alcohol.  Your doctor will talk to you about when you'll need your next colonoscopy. The results of your test and your risk for colorectal cancer will help your doctor decide how often you need to be checked.  After the test, you may be bloated or have gas pains. You may need to pass gas. If a biopsy was done or a polyp was removed, you may have streaks of blood in your stool (feces) for a few days. Check with your doctor to see when it is safe to take aspirin  and nonsteroidal anti-inflammatory drugs (NSAIDs) again.  Problems such as heavy rectal bleeding may not occur until several weeks after the test. This isn't common. But it can happen after polyps are removed.  Follow-up care is a key part of your treatment and safety. Be sure to make and go to all appointments, and call your doctor if you are having problems. It's also a good idea to know your test results and keep a list of the medicines you take.  Where can you learn more?  Go to [employer] and enter Z368 to learn more about "Learning About Colonoscopy."  Current as of: May 28, 2022  Content Version: 14.1   2006-2024 [employer].   Care instructions adapted under license  by [facility]. If you have questions about a medical condition or this instruction, always ask your healthcare professional. [employer] disclaims any warranty or liability for your use of this information.         Colon Polyps: Care Instructions  Your Care Instructions     Colon polyps are growths in the colon or the rectum. The cause of most colon polyps is not known, and most people who get them do not have any problems. But a certain kind can turn into cancer. For this reason, regular testing for colon polyps is important for people as they get older. It is also important for anyone who has an increased risk for colon  cancer.  Polyps are usually found through routine colon cancer screening tests. Although most colon polyps are not cancerous, they are usually removed and then tested for cancer. Screening for colon cancer saves lives because the cancer can usually be cured if it is caught early.  If you have a polyp that is the type that can turn into cancer, you may need more tests to examine your entire colon. The doctor will remove any other polyps that are found, and you will be tested more often.  Follow-up care is a key part of your treatment and safety. Be sure to make and go to all appointments, and call your doctor if you are having problems. It's also a good idea to know your test results and keep a list of the medicines you take.  How can you care for yourself at home?  Regular exams to look for colon polyps are the best way to prevent polyps from turning into colon cancer. These can include stool tests, sigmoidoscopy, colonoscopy, and CT colonography. Talk with your doctor about a testing schedule that is right for you.  To prevent polyps  There is no home treatment that can prevent colon polyps. But these steps may help lower your risk for cancer.  Stay active. Being active can help you get to and stay at a healthy weight. Try to exercise on most days of the week. Walking is a good choice.  Eat well. Choose a variety of vegetables, fruits, legumes (such as peas and beans), fish, poultry, and whole grains.  Do not smoke. If you need help quitting, talk to your doctor about stop-smoking programs and medicines. These can increase your chances of quitting for good.  If you drink alcohol, limit how much you drink. Limit alcohol to 2 drinks a day for men and 1 drink a day for women.  When should you call for help?   Call your doctor now or seek immediate medical care if:    You have severe belly pain.     Your stools are maroon or very bloody.   Watch closely for changes in your health, and be sure to contact your doctor  if:    You have a fever.     You have nausea or vomiting.     You have a change in bowel habits (new constipation or diarrhea).     Your symptoms get worse or are not improving as expected.   Where can you learn more?  Go to [employer] and enter C571 to learn more about "Colon Polyps: Care Instructions."  Current as of: May 22, 2022  Content Version: 14.1   2006-2024 [employer].   Care instructions adapted under license by [facility]. If you have questions about a medical condition or this instruction, always ask your  healthcare professional. [employer] disclaims any warranty or liability for your use of this information.

## 2023-04-08 NOTE — H&P (Signed)
 [email or web address]  [phone]    GASTROENTEROLOGY CONSULT      Impression:   1. CRCS      Plan:     1. Colo mac all risks benefits and alt discussed all questions answered pt wishes to proceed       Chief Complaint: CRCS      HPI:  [name] Short is a 67 y.o. female who I am being asked to see in consultation for an opinion regarding CRCS.    PMH:   Past Medical History:   Diagnosis Date    Anemia     Arthritis     Chronic pain     Legs and Shoulders    Diabetes (HCC)     Exposure to asbestos     History of blood transfusion     History of colon polyps     HTN (hypertension)     Hx of blood clots     DVT    Hyperlipemia     Menopause     Pulmonary nodule     Serum calcium elevated     Sleep apnea     not using cpap    Stroke (HCC) 12/2021    left sided weakness       PSH:   Past Surgical History:   Procedure Laterality Date    BACK SURGERY N/A 06/24/2022    INCISION AND DRAINAGE /DEBRIDEMENT SACRAL DECUBITUS performed by Drena Gentile, DO at [facility] MAIN OR    CESAREAN SECTION  1987    COLONOSCOPY N/A 01/13/2017    COLONOSCOPY performed by Geneva Kerbs, MD at [facility] ENDOSCOPY    COLONOSCOPY      DILATION AND CURETTAGE OF UTERUS  1995    RECTAL SURGERY N/A 12/28/2021    SACRAL WOUND DEBRIDEMENT INCISION AND DRAINAGE performed by Drena Gentile, DO at [facility] MAIN OR    SPINE SURGERY N/A 12/09/2021    CERVICAL THREE/FOUR/FIVE/SIX LAMINECTOMY FUSION; C-ARM; STRYKER; EXT BONE GROWTH STIM; 23 HR performed by Alvino Aye, MD at [facility] MAIN OR       Social HX:   Social History     Socioeconomic History    Marital status: Single     Spouse name: Not on file    Number of children: Not on file    Years of education: Not on file    Highest education level: Not on file   Occupational History    Not on file   Tobacco Use    Smoking status: Former     Current packs/day: 0.00     Types: Cigarettes, Cigars     Quit date: 11/13/2021     Years since quitting: 1.4    Smokeless tobacco: Never   Vaping Use    Vaping status:  Never Used   Substance and Sexual Activity    Alcohol use: Not Currently     Comment: occ    Drug use: Never    Sexual activity: Not on file   Other Topics Concern    Not on file   Social History Narrative    Not on file     Social Determinants of Health     Financial Resource Strain: Low Risk  (01/05/2022)    Overall Financial Resource Strain (CARDIA)     Difficulty of Paying Living Expenses: Not hard at all   Food Insecurity: Food Insecurity Present (08/24/2022)    Hunger Vital Sign     Worried About Running Out of Food in the Last Year:  Sometimes true     Ran Out of Food in the Last Year: Sometimes true   Transportation Needs: No Transportation Needs (10/10/2022)    OASIS A1250: Transportation     Lack of Transportation (Medical): No     Lack of Transportation (Non-Medical): No     Patient Unable or Declines to Respond: No   Physical Activity: Inactive (01/05/2022)    Exercise Vital Sign     Days of Exercise per Week: 0 days     Minutes of Exercise per Session: 0 min   Stress: No Stress Concern Present (01/05/2022)    [employer] of Occupational Health - Occupational Stress Questionnaire     Feeling of Stress : Not at all   Social Connections: Feeling Socially Integrated (10/10/2022)    OASIS D0700: Social Isolation     Frequency of experiencing loneliness or isolation: Never   Intimate Partner Violence: Not At Risk (01/05/2022)    Humiliation, Afraid, Rape, and Kick questionnaire     Fear of Current or Ex-Partner: No     Emotionally Abused: No     Physically Abused: No     Sexually Abused: No   Housing Stability: Low Risk  (08/24/2022)    Housing Stability Vital Sign     Unable to Pay for Housing in the Last Year: No     Number of Places Lived in the Last Year: 1     Unstable Housing in the Last Year: No       FHX:   Family History   Problem Relation Age of Onset    Diabetes Other 45        parent, NOS    Cancer Father     Hypertension Other 35        parent,NOS    Heart Disease Other 79        parent, NOS    Lung  Disease Father        Allergy:   Allergies   Allergen Reactions    Lactose Other (See Comments)     Intolerance       Patient Active Problem List   Diagnosis    Iron  deficiency anemia due to chronic blood loss    Diabetes mellitus, type 2 (HCC)    Occult blood in stools    Stroke (HCC)    Symptomatic anemia    Cervical myelopathy (HCC)    Obstructive sleep apnea syndrome    Spinal stenosis of lumbar region    Syncope    Cerebrovascular accident (HCC)    Failure to thrive in adult    Skin ulcer of sacrum (HCC)    RUQ pain    Pressure injury of skin of sacral region    Lacunar infarct, acute (HCC)    Acute CVA (cerebrovascular accident) (HCC)    Sepsis (HCC)    Hypertension    History of CVA (cerebrovascular accident)    Hyperlipidemia    Sacral osteomyelitis (HCC)    COVID-19    Hypertensive urgency    Altered mental status    Abnormal CT of the head       Home Medications:     Prior to Admission medications    Medication Sig Start Date End Date Taking? Authorizing Provider   Probiotic Product (PROBIOTIC BLEND PO) Take by mouth Daily   Yes [provider]   famotidine  (PEPCID ) 20 MG tablet TAKE 1 TABLET BY MOUTH ONCE A DAY FOR GERD    [provider]  vitamin C (ASCORBIC ACID) 500 MG tablet Take 1 tablet by mouth daily    [provider]   insulin  glargine (LANTUS ) 100 UNIT/ML injection vial Inject 10 Units into the skin daily  Patient taking differently: Inject 20 Units into the skin daily 08/25/22   Jimmye Moulds, MD   insulin  lispro (HUMALOG ) 100 UNIT/ML SOLN injection vial Inject 3 Units into the skin 3 times daily (with meals) 08/25/22   Jimmye Moulds, MD   hydrALAZINE  (APRESOLINE ) 25 MG tablet Take 1 tablet by mouth every 8 hours  Patient taking differently: Take 1 tablet by mouth 4 times daily As needed elevated SBP <140 08/25/22   Jimmye Moulds, MD   potassium chloride  (KLOR-CON  M) 10 MEQ extended release tablet Take 1 tablet by mouth daily     [provider]   cinacalcet  (SENSIPAR ) 30 MG tablet Take 1 tablet by mouth 2 times daily 08/13/22   [provider]   carvedilol  (COREG ) 6.25 MG tablet Take 1 tablet by mouth 2 times daily (with meals) 07/02/22   Alois Jakob, MD   baclofen  (LIORESAL ) 10 MG tablet Take 1 tablet by mouth 3 times daily as needed (PAIN) Indications: Pain FOR PAIN PER SHETH 04/07/22   Fetters, Edwina Gram D, PA   Magnesium  Oxide 400 MG CAPS Take 1 capsule by mouth daily.    [provider]   aspirin  325 MG tablet Take 1 tablet by mouth daily    [provider]   atorvastatin  (LIPITOR ) 80 MG tablet Take 1 tablet by mouth nightly 01/06/22   Jimmye Moulds, MD   ferrous sulfate  (IRON  325) 325 (65 Fe) MG tablet Take 1 tablet by mouth daily 01/06/22   Jimmye Moulds, MD   Multiple Vitamins-Minerals (THERAPEUTIC MULTIVITAMIN-MINERALS) tablet Take 1 tablet by mouth daily 01/07/22   Jimmye Moulds, MD   furosemide  (LASIX ) 40 MG tablet furosemide  40 mg tablet   TAKE 1 TABLET BY MOUTH EVERY DAY 09/23/21   [provider]       Review of Systems:     Constitutional: No fevers, chills, weight loss, fatigue.   Skin: No rashes, pruritis, jaundice, ulcerations, erythema.   HENT: No headaches, nosebleeds, sinus pressure, rhinorrhea, sore throat.   Eyes: No visual changes, blurred vision, eye pain, photophobia, jaundice.   Cardiovascular: No chest pain, heart palpitations.   Respiratory: No cough, SOB, wheezing, chest discomfort, orthopnea.   Gastrointestinal: Neg unless noted otherwise in H&P   Genitourinary: No dysuria, bleeding, discharge, pyuria.   Musculoskeletal: No weakness, arthralgias, wasting.   Endo: No sweats.   Heme: No bruising, easy bleeding.   Allergies: As noted.   Neurological: Cranial nerves intact.  Alert and oriented. Gait not assessed.   Psychiatric:  No anxiety, depression, hallucinations.          There were no vitals taken for this visit.    Physical  Assessment:     constitutional: appearance: well developed, well nourished, normal habitus, no deformities, in no acute distress.   skin: inspection: no rashes, ulcers, icterus or other lesions; no clubbing or telangiectasias. palpation: no induration or subcutaneos nodules.   eyes: inspection: normal conjunctivae and lids; no jaundice pupils: normal  ENMT: mouth: normal oral mucosa,lips and gums; good dentition. oropharynx: normal tongue, hard and soft palate; posterior pharynx without erithema, exudate or lesions.   neck: thyroid: normal size, consistency and position; no masses or tenderness.   respiratory: effort: normal chest excursion; no intercostal retraction or accessory muscle use.  cardiovascular: abdominal aorta: normal size and position; no bruits. palpation: PMI of normal size and position; normal rhythm; no thrill or murmurs.   abdominal: abdomen: normal consistency; no tenderness or masses. hernias: no hernias appreciated. liver: normal size and consistency. spleen: not palpable.   rectal: hemoccult/guaiac: not performed.   musculoskeletal: digits and nails: no clubbing, cyanosis, petechiae or other inflammatory conditions. head and neck: normal range of motion; no pain, crepitation or contracture. spine/ribs/pelvis: normal range of motion; no pain, deformity or contracture.   neurologic: cranial nerves: II-XII normal. Pupils intact.   psychiatric: judgement/insight: within normal limits. memory: within normal limits for recent and remote events. mood and affect: no evidence of depression, anxiety or agitation. orientation: oriented to time, space and person.        Basic Metabolic Profile   No results for input(s): "NA", "K", "CL", "CO2", "BUN", "GLU", "MG", "PHOS" in the last 72 hours.    Invalid input(s): "CREAT", "CA"      CBC w/Diff    No results for input(s): "WBC", "RBC", "HGB", "HCT", "MCV", "MCH", "MCHC", "RDW", "PLT", "MPV" in the last 72 hours. No results for input(s): "MONO", "PRO",  "METAS" in the last 72 hours.    Invalid input(s): "GRANS", "LYMPH", "EOS", "BASO", "MYELO", "BLAST"     Hepatic Function   No results for input(s): "TP" in the last 72 hours.    Invalid input(s): "ALB", "DBILI", "TBILI", "GPT", "SGOT", "AP", "AML", "LPSE"     Coags   No results for input(s): "INR", "APTT" in the last 72 hours.    Invalid input(s): "PTP"        Newt Barefoot, MD.   [facility]  [phone]

## 2023-04-08 NOTE — Anesthesia Pre-Procedure Evaluation (Signed)
 Department of Anesthesiology  Preprocedure Note       Name:  [name] Short   Age:  67 y.o.  DOB:  1956-02-27                                          MRN:  [id number]         Date:  04/08/2023      Surgeon: Kelby Patches):  Newt Barefoot, MD    Procedure: Procedure(s):  COLONOSCOPY DIAGNOSTIC    Medications prior to admission:   Prior to Admission medications    Medication Sig Start Date End Date Taking? Authorizing Provider   Probiotic Product (PROBIOTIC BLEND PO) Take by mouth Daily   Yes [provider]   famotidine  (PEPCID ) 20 MG tablet TAKE 1 TABLET BY MOUTH ONCE A DAY FOR GERD   Yes [provider]   vitamin C (ASCORBIC ACID) 500 MG tablet Take 1 tablet by mouth daily   Yes [provider]   insulin  glargine (LANTUS ) 100 UNIT/ML injection vial Inject 10 Units into the skin daily  Patient taking differently: Inject 20 Units into the skin daily 08/25/22  Yes Talur-Matadha, Nickolas Barr, MD   insulin  lispro (HUMALOG ) 100 UNIT/ML SOLN injection vial Inject 3 Units into the skin 3 times daily (with meals) 08/25/22  Yes Talur-Matadha, Nickolas Barr, MD   hydrALAZINE  (APRESOLINE ) 25 MG tablet Take 1 tablet by mouth every 8 hours  Patient taking differently: Take 1 tablet by mouth 4 times daily As needed elevated SBP <140 08/25/22  Yes Talur-Matadha, Nickolas Barr, MD   potassium chloride  (KLOR-CON  M) 10 MEQ extended release tablet Take 1 tablet by mouth daily   Yes [provider]   cinacalcet  (SENSIPAR ) 30 MG tablet Take 1 tablet by mouth 2 times daily 08/13/22  Yes [provider]   carvedilol  (COREG ) 6.25 MG tablet Take 1 tablet by mouth 2 times daily (with meals) 07/02/22  Yes Saini, Hallie Levers, MD   baclofen  (LIORESAL ) 10 MG tablet Take 1 tablet by mouth 3 times daily as needed (PAIN) Indications: Pain FOR PAIN PER SHETH 04/07/22  Yes Randal Bury D, PA   Magnesium  Oxide 400 MG CAPS Take 1 capsule by mouth daily.   Yes [provider]   aspirin  325 MG tablet Take 1 tablet  by mouth daily   Yes [provider]   atorvastatin  (LIPITOR ) 80 MG tablet Take 1 tablet by mouth nightly 01/06/22  Yes Talur-Matadha, Nickolas Barr, MD   ferrous sulfate  (IRON  325) 325 (65 Fe) MG tablet Take 1 tablet by mouth daily 01/06/22  Yes Talur-Matadha, Nickolas Barr, MD   Multiple Vitamins-Minerals (THERAPEUTIC MULTIVITAMIN-MINERALS) tablet Take 1 tablet by mouth daily 01/07/22  Yes Talur-Matadha, Nickolas Barr, MD   furosemide  (LASIX ) 40 MG tablet furosemide  40 mg tablet   TAKE 1 TABLET BY MOUTH EVERY DAY 09/23/21  Yes [provider]       Current medications:    Current Facility-Administered Medications   Medication Dose Route Frequency Provider Last Rate Last Admin   . lidocaine  PF 1 % injection 1 mL  1 mL IntraDERmal Once PRN Melissa Spring, APRN - CRNA       . famotidine  (PEPCID ) tablet 20 mg  20 mg Oral Once Caras, Adrian A, APRN - CRNA       . lactated ringers  IV soln infusion   IntraVENous Continuous Avonne Boettcher Joesphine Must, APRN -  CRNA       . sodium chloride  flush 0.9 % injection 5-40 mL  5-40 mL IntraVENous PRN Caras, Joesphine Must, APRN - CRNA       . insulin  lispro (HUMALOG ,ADMELOG ) injection vial 0-15 Units  0-15 Units SubCUTAneous Once Caras, Adrian A, APRN - CRNA       . glucose chewable tablet 16 g  4 tablet Oral PRN Melissa Spring, APRN - CRNA       . dextrose  bolus 10% 125 mL  125 mL IntraVENous PRN Avonne Boettcher, Joesphine Must, APRN - CRNA        Or   . dextrose  bolus 10% 250 mL  250 mL IntraVENous PRN Caras, Joesphine Must, APRN - CRNA       . dextrose  10 % infusion   IntraVENous Continuous PRN Avonne Boettcher Joesphine Must, APRN - CRNA           Allergies:    Allergies   Allergen Reactions   . Lactose Other (See Comments)     Intolerance       Problem List:    Patient Active Problem List   Diagnosis Code   . Iron  deficiency anemia due to chronic blood loss D50.0   . Diabetes mellitus, type 2 (HCC) E11.9   . Occult blood in stools R19.5   . Stroke (HCC) I63.9   . Symptomatic anemia D64.9   . Cervical myelopathy (HCC) G95.9    . Obstructive sleep apnea syndrome G47.33   . Spinal stenosis of lumbar region M48.061   . Syncope R55   . Cerebrovascular accident (HCC) I63.9   . Failure to thrive in adult R62.7   . Skin ulcer of sacrum (HCC) L98.429   . RUQ pain R10.11   . Pressure injury of skin of sacral region L89.159   . Lacunar infarct, acute (HCC) I63.81   . Acute CVA (cerebrovascular accident) (HCC) I63.9   . Sepsis (HCC) A41.9   . Hypertension I10   . History of CVA (cerebrovascular accident) Z25.73   . Hyperlipidemia E78.5   . Sacral osteomyelitis (HCC) M46.28   . COVID-19 U07.1   . Hypertensive urgency I16.0   . Altered mental status R41.82   . Abnormal CT of the head R93.0       Past Medical History:        Diagnosis Date   . Anemia    . Arthritis    . Chronic pain     Legs and Shoulders   . Diabetes (HCC)    . Exposure to asbestos    . History of blood transfusion    . History of colon polyps    . HTN (hypertension)    . Hx of blood clots     DVT   . Hyperlipemia    . Menopause    . Pulmonary nodule    . Serum calcium elevated    . Sleep apnea     not using cpap   . Stroke [facility]) 12/2021    left sided weakness       Past Surgical History:        Procedure Laterality Date   . BACK SURGERY N/A 06/24/2022    INCISION AND DRAINAGE /DEBRIDEMENT SACRAL DECUBITUS performed by Drena Gentile, DO at [facility] MAIN OR   . CESAREAN SECTION  1987   . COLONOSCOPY N/A 01/13/2017    COLONOSCOPY performed by Geneva Kerbs, MD at [facility] ENDOSCOPY   . COLONOSCOPY     . DILATION AND  CURETTAGE OF UTERUS  1995   . RECTAL SURGERY N/A 12/28/2021    SACRAL WOUND DEBRIDEMENT INCISION AND DRAINAGE performed by Drena Gentile, DO at [facility] MAIN OR   . SPINE SURGERY N/A 12/09/2021    CERVICAL THREE/FOUR/FIVE/SIX LAMINECTOMY FUSION; C-ARM; STRYKER; EXT BONE GROWTH STIM; 23 HR performed by Alvino Aye, MD at [facility] MAIN OR       Social History:    Social History     Tobacco Use   . Smoking status: Former     Current packs/day: 0.00     Types: Cigarettes, Cigars     Quit  date: 11/13/2021     Years since quitting: 1.4   . Smokeless tobacco: Never   Substance Use Topics   . Alcohol use: Not Currently     Comment: occ                                Counseling given: Not Answered      Vital Signs (Current):   Vitals:    04/08/23 0713   BP: (!) 156/64   Pulse: 62   Resp: 18   Temp: 98.1 F (36.7 C)   SpO2: 100%   Weight: 69.4 kg (153 lb)   Height: 1.499 m (4' 11.01")                                              BP Readings from Last 3 Encounters:   04/08/23 (!) 156/64   03/18/23 (!) 151/65   01/30/23 138/68       NPO Status: Time of last liquid consumption: 0330                        Time of last solid consumption: 2100                        Date of last liquid consumption: 04/08/23                        Date of last solid food consumption: 04/06/23    BMI:   Wt Readings from Last 3 Encounters:   04/08/23 69.4 kg (153 lb)   03/23/23 68.9 kg (152 lb)   03/18/23 70 kg (154 lb 6.4 oz)     Body mass index is 30.89 kg/m.    CBC:   Lab Results   Component Value Date/Time    WBC 5.3 03/04/2023 10:38 AM    RBC 4.88 03/04/2023 10:38 AM    HGB 12.3 03/04/2023 10:38 AM    HCT 38.5 03/04/2023 10:38 AM    MCV 78.9 03/04/2023 10:38 AM    RDW 14.0 03/04/2023 10:38 AM    PLT 192 03/04/2023 10:38 AM       CMP:   Lab Results   Component Value Date/Time    NA 143 03/04/2023 10:38 AM    K 4.4 03/04/2023 10:38 AM    CL 111 03/04/2023 10:38 AM    CO2 27 03/04/2023 10:38 AM    BUN 32 03/04/2023 10:38 AM    CREATININE 0.75 03/04/2023 10:38 AM    GFRAA >60.0 08/09/2022 11:00 PM    AGRATIO 0.9 05/26/2021 02:43 AM    LABGLOM 88 03/04/2023 10:38 AM  LABGLOM 71 11/06/2022 04:06 PM    LABGLOM >60 05/26/2021 02:43 AM    GLUCOSE 97 03/04/2023 10:38 AM    GLUCOSE 232 08/09/2022 11:00 PM    CALCIUM 9.1 03/04/2023 10:38 AM    BILITOT 0.4 03/04/2023 10:38 AM    ALKPHOS 156 03/04/2023 10:38 AM    ALKPHOS 118 08/09/2022 11:00 PM    AST 88 03/04/2023 10:38 AM    ALT 226 03/04/2023 10:38 AM       POC Tests:   No results  for input(s): "POCGLU", "POCNA", "POCK", "POCCL", "POCBUN", "POCHEMO", "POCHCT" in the last 72 hours.      Coags:   Lab Results   Component Value Date/Time    PROTIME 13.7 08/23/2022 01:05 PM    INR 1.0 08/23/2022 01:05 PM       HCG (If Applicable): No results found for: "PREGTESTUR", "PREGSERUM", "HCG", "HCGQUANT"     ABGs: No results found for: "PHART", "PO2ART", "PCO2ART", "HCO3ART", "BEART", "O2SATART"     Type & Screen (If Applicable):  No results found for: "LABABO"    Drug/Infectious Status (If Applicable):  No results found for: "HIV", "HEPCAB"    COVID-19 Screening (If Applicable):   Lab Results   Component Value Date/Time    COVID19 Not detected 12/11/2022 01:18 AM           Anesthesia Evaluation  Patient summary reviewed   no history of anesthetic complications:   Airway: Mallampati: III  TM distance: <3 FB   Neck ROM: full  Mouth opening: > = 3 FB   Dental:    (+) poor dentition      Pulmonary:normal exam    (+)     sleep apnea: on CPAP,                                  Cardiovascular:    (+) hypertension: mild      ECG reviewed      Echocardiogram reviewed                  Neuro/Psych:   (+) CVA: residual symptoms            GI/Hepatic/Renal:   (+) morbid obesity          Endo/Other:    (+) DiabetesType II DM, no interval change.                 Abdominal:   (+) obese          Vascular: negative vascular ROS.         Other Findings:         Anesthesia Plan      MAC     ASA 3       Induction: intravenous.      Anesthetic plan and risks discussed with patient.                      Taydon Nasworthy Brummett Jr, MD   04/08/2023

## 2023-04-08 NOTE — Anesthesia Post-Procedure Evaluation (Signed)
 Department of Anesthesiology  Postprocedure Note    Patient: [name] Short  MRN: [id number]  Birthdate: 08-25-1955  Date of evaluation: 04/08/2023    Procedure Summary       Date: 04/08/23 Room / Location: [facility] ENDO 03 / [facility] ENDOSCOPY    Anesthesia Start: 0738 Anesthesia Stop: 0824    Procedure: COLONOSCOPY DIAGNOSTIC (Abdomen) Diagnosis:       Elevated liver enzymes      Benign neoplasm of descending colon      Eosinophilic esophagitis      Gastroesophageal reflux disease, unspecified whether esophagitis present      Iron  deficiency anemia, unspecified iron  deficiency anemia type      BMI 26.0-26.9,adult      (Elevated liver enzymes [R74.8])      (Benign neoplasm of descending colon [D12.4])      (Eosinophilic esophagitis [K20.0])      (Gastroesophageal reflux disease, unspecified whether esophagitis present [K21.9])      (Iron  deficiency anemia, unspecified iron  deficiency anemia type [D50.9])      (BMI 26.0-26.9,adult [W09.81])    Surgeons: Newt Barefoot, MD Responsible Provider: Yancey Aubree., MD    Anesthesia Type: MAC ASA Status: 3            Anesthesia Type: MAC    Aldrete Phase I: Aldrete Score: 10    Aldrete Phase II: Aldrete Score: 10    Anesthesia Post Evaluation    Patient location during evaluation: bedside  Airway patency: patent  Cardiovascular status: hemodynamically stable  Respiratory status: acceptable  Hydration status: stable  Pain management: adequate    No notable events documented.

## 2023-04-08 NOTE — Other (Signed)
 Dr. Sande Rives aware that patients Blood Glucose is 86. No intervention required at this time

## 2023-04-28 ENCOUNTER — Ambulatory Visit: Payer: Medicare Other

## 2023-04-28 VITALS — Wt 209.0 lb

## 2023-04-28 DIAGNOSIS — Z0000 Encounter for general adult medical examination without abnormal findings: Secondary | ICD-10-CM

## 2023-04-28 NOTE — Progress Notes (Signed)
Subjective:   Julie Elliott is a 67 y.o. female who presents for an Initial Medicare Annual Wellness Visit.  Visit Complete: Virtual  I connected with  Julie Elliott on 04/28/23 by a audio enabled telemedicine application and verified that I am speaking with the correct person using two identifiers.  Patient Location: Home  Provider Location: Home Office  I discussed the limitations of evaluation and management by telemedicine. The patient expressed understanding and agreed to proceed. Vital Signs: Unable to obtain new vitals due to this being a telehealth visit.   Cardiac Risk Factors include: advanced age (>71men, >61 women);hypertension;dyslipidemia;diabetes mellitus;obesity (BMI >30kg/m2)     Objective:    Today's Vitals   04/28/23 1441  Weight: 209 lb (94.8 kg)   Body mass index is 37.02 kg/m.     08/20/2021    4:25 PM 03/06/2021    4:01 PM 11/05/2020    3:57 PM 05/07/2020    4:05 PM 11/28/2019    4:46 PM 06/21/2019    3:43 PM 04/27/2018    3:51 PM  Advanced Directives  Does Patient Have a Medical Advance Directive? No No No No No No No  Would patient like information on creating a medical advance directive? No - Patient declined Yes (MAU/Ambulatory/Procedural Areas - Information given) Yes (MAU/Ambulatory/Procedural Areas - Information given) No - Patient declined No - Patient declined No - Patient declined No - Patient declined    Current Medications (verified) Outpatient Encounter Medications as of 04/28/2023  Medication Sig   ammonium lactate (AMLACTIN) 12 % lotion Apply 1 application topically 2 (two) times daily. Apply to feet once daily.   aspirin 81 MG EC tablet Take 1 tablet (81 mg total) by mouth daily.   atorvastatin (LIPITOR) 40 MG tablet Take 1 tablet (40 mg total) by mouth daily.   buPROPion (WELLBUTRIN SR) 150 MG 12 hr tablet Take 1 tablet (150 mg total) by mouth 2 (two) times daily.   clotrimazole-betamethasone (LOTRISONE) cream Apply to both feet twice daily  for 6 weeks.   Empagliflozin-metFORMIN HCl (SYNJARDY) 5-500 MG TABS Take 1 tablet by mouth 2 (two) times daily.   enalapril (VASOTEC) 5 MG tablet Take 1 tablet (5 mg total) by mouth 2 (two) times daily.   furosemide (LASIX) 40 MG tablet Take 1 tablet (40 mg total) by mouth 2 (two) times daily.   metoprolol succinate (TOPROL-XL) 50 MG 24 hr tablet TAKE 1 TABLET DAILY WITH OR IMMEDIATELY FOLLOWING A MEAL.   potassium chloride SA (KLOR-CON M) 20 MEQ tablet Take 1 tablet (20 mEq total) by mouth daily.   spironolactone (ALDACTONE) 25 MG tablet Take 1 tablet (25 mg total) by mouth daily.   [DISCONTINUED] pregabalin (LYRICA) 75 MG capsule TAKE 1 CAPSULE IN THE MORNING, 1 CAPSULE IN THE EVENING AND 2 AT BEDTIME. Please see if primary care will refill this going forward.   No facility-administered encounter medications on file as of 04/28/2023.    Allergies (verified) Loratadine-pseudoephedrine er   History: Past Medical History:  Diagnosis Date   Cervical disc disease with myelopathy    CHF (congestive heart failure) (HCC)    Chronic headaches    DJD (degenerative joint disease)    DM (diabetes mellitus) (HCC) 08/04/2006   stable HgBA1C at 6.5   GERD (gastroesophageal reflux disease)    well controlled on Omeprazole   HLD (hyperlipidemia) 08/04/2006   stable, well controlled   Hypertension    well controlled   Past Surgical History:  Procedure Laterality Date  carpel tunnel Right    20 yrs ago   CERVICAL SPINE SURGERY  2016   TUBAL LIGATION     Family History  Problem Relation Age of Onset   Diabetes Mother    Heart disease Mother    Prostate cancer Father    Neuropathy Neg Hx    Social History   Socioeconomic History   Marital status: Single    Spouse name: Not on file   Number of children: 3   Years of education: 6   Highest education level: Not on file  Occupational History   Occupation: Unemployed  Tobacco Use   Smoking status: Some Days    Current packs/day:  0.00    Types: Cigarettes    Last attempt to quit: 04/18/2013    Years since quitting: 10.0   Smokeless tobacco: Current   Tobacco comments:    1-2 cigs in the a.m.  Vaping Use   Vaping status: Never Used  Substance and Sexual Activity   Alcohol use: No    Alcohol/week: 0.0 standard drinks of alcohol   Drug use: No   Sexual activity: Not Currently  Other Topics Concern   Not on file  Social History Narrative   to get supplies nfrom [facility] per pt request.1--[phone]   Lives at home with son.    Right handed.   Caffeine use: drinks coffee-rare   Social Determinants of Health   Financial Resource Strain: Low Risk  (04/28/2023)   Overall Financial Resource Strain (CARDIA)    Difficulty of Paying Living Expenses: Not hard at all  Food Insecurity: No Food Insecurity (04/28/2023)   Hunger Vital Sign    Worried About Running Out of Food in the Last Year: Never true    Ran Out of Food in the Last Year: Never true  Transportation Needs: No Transportation Needs (04/28/2023)   PRAPARE - Administrator, Civil Service (Medical): No    Lack of Transportation (Non-Medical): No  Physical Activity: Insufficiently Active (04/28/2023)   Exercise Vital Sign    Days of Exercise per Week: 7 days    Minutes of Exercise per Session: 10 min  Stress: No Stress Concern Present (04/28/2023)   [employer] of Occupational Health - Occupational Stress Questionnaire    Feeling of Stress : Not at all  Social Connections: Socially Isolated (04/28/2023)   Social Connection and Isolation Panel [NHANES]    Frequency of Communication with Friends and Family: More than three times a week    Frequency of Social Gatherings with Friends and Family: More than three times a week    Attends Religious Services: Never    Database administrator or Organizations: No    Attends Banker Meetings: Never    Marital Status: Never married    Tobacco Counseling Ready to quit: Not  Answered Counseling given: Not Answered Tobacco comments: 1-2 cigs in the a.m.   Clinical Intake:  Pre-visit preparation completed: Yes  Pain : No/denies pain     BMI - recorded: 37.02 Nutritional Status: BMI > 30  Obese Nutritional Risks: None Diabetes: Yes CBG done?: No Did pt. bring in CBG monitor from home?: No  How often do you need to have someone help you when you read instructions, pamphlets, or other written materials from your doctor or pharmacy?: 1 - Never  Interpreter Needed?: No  Information entered by :: Lanier Ensign, LPN   Activities of Daily Living    04/28/2023    2:42  PM  In your present state of health, do you have any difficulty performing the following activities:  Hearing? 0  Vision? 0  Difficulty concentrating or making decisions? 0  Walking or climbing stairs? 1  Dressing or bathing? 1  Comment has an aid  Doing errands, shopping? 0  Preparing Food and eating ? Y  Comment has an aid  Using the Toilet? N  In the past six months, have you accidently leaked urine? Y  Comment wears a pad  Do you have problems with loss of bowel control? N  Managing your Medications? N  Managing your Finances? N  Housekeeping or managing your Housekeeping? N    Patient Care Team: Manuela Neptune, MD as PCP - General Jodelle Red, MD as PCP - Cardiology (Cardiology) Dr. August Saucer as Consulting Physician (Ophthalmology)  Indicate any recent Medical Services you may have received from other than [facility] providers in the past year (date may be approximate).     Assessment:   This is a routine wellness examination for [place].  Hearing/Vision screen Hearing Screening - Comments:: Pt denies any hearing issues  Vision Screening - Comments:: Pt stated she will follow up with provider    Goals Addressed   None    Depression Screen    04/28/2023    2:46 PM 04/03/2022    2:02 PM 08/20/2021    4:25 PM 03/06/2021    3:50 PM 11/05/2020     4:08 PM 05/07/2020    4:50 PM 11/28/2019    4:47 PM  PHQ 2/9 Scores  PHQ - 2 Score 0 0 0 0 0 0 0  PHQ- 9 Score     0 0     Fall Risk    04/28/2023    2:57 PM 08/20/2021    4:23 PM 03/06/2021    3:50 PM 11/05/2020    3:58 PM 05/07/2020    4:03 PM  Fall Risk   Falls in the past year? 0 1 0 0 0  Number falls in past yr: 0 0     Injury with Fall? 0 0     Risk for fall due to : Impaired mobility;Impaired balance/gait;Impaired vision  Impaired balance/gait;Impaired mobility    Follow up Falls prevention discussed  Falls prevention discussed      MEDICARE RISK AT HOME: Medicare Risk at Home Any stairs in or around the home?: Yes If so, are there any without handrails?: No Home free of loose throw rugs in walkways, pet beds, electrical cords, etc?: Yes Adequate lighting in your home to reduce risk of falls?: Yes Life alert?: No Use of a cane, walker or w/c?: Yes Grab bars in the bathroom?: Yes Shower chair or bench in shower?: Yes Elevated toilet seat or a handicapped toilet?: No  TIMED UP AND GO:  Was the test performed? No    Cognitive Function:        04/28/2023    2:57 PM 04/03/2022    2:03 PM  6CIT Screen  What Year? 0 points 0 points  What month? 0 points 0 points  What time? 0 points 0 points  Count back from 20 0 points 0 points  Months in reverse 0 points 0 points  Repeat phrase 0 points 0 points  Total Score 0 points 0 points    Immunizations Immunization History  Administered Date(s) Administered   Influenza-Unspecified 05/04/2018      Flu Vaccine status: Declined, Education has been provided regarding the importance of this vaccine but  patient still declined. Advised may receive this vaccine at local pharmacy or Health Dept. Aware to provide a copy of the vaccination record if obtained from local pharmacy or Health Dept. Verbalized acceptance and understanding.  Pneumococcal vaccine status: Declined,  Education has been provided regarding the importance of  this vaccine but patient still declined. Advised may receive this vaccine at local pharmacy or Health Dept. Aware to provide a copy of the vaccination record if obtained from local pharmacy or Health Dept. Verbalized acceptance and understanding.   Covid-19 vaccine status: Declined, Education has been provided regarding the importance of this vaccine but patient still declined. Advised may receive this vaccine at local pharmacy or Health Dept.or vaccine clinic. Aware to provide a copy of the vaccination record if obtained from local pharmacy or Health Dept. Verbalized acceptance and understanding.  Qualifies for Shingles Vaccine? Yes   Zostavax completed No   Shingrix Completed?: No.    Education has been provided regarding the importance of this vaccine. Patient has been advised to call insurance company to determine out of pocket expense if they have not yet received this vaccine. Advised may also receive vaccine at local pharmacy or Health Dept. Verbalized acceptance and understanding.  Screening Tests Health Maintenance  Topic Date Due   Pneumonia Vaccine 71+ Years old (1 of 2 - PCV) Never done   Zoster Vaccines- Shingrix (1 of 2) Never done   MAMMOGRAM  11/12/2016   Cervical Cancer Screening (HPV/Pap Cotest)  11/02/2017   Colonoscopy  07/21/2018   DEXA SCAN  02/01/2023   HEMOGLOBIN A1C  02/18/2023   INFLUENZA VACCINE  03/05/2023   COVID-19 Vaccine (1 - 2023-24 season) Never done   Diabetic kidney evaluation - eGFR measurement  08/21/2023   Diabetic kidney evaluation - Urine ACR  08/21/2023   Medicare Annual Wellness (AWV)  04/27/2024   Hepatitis C Screening  Completed   HIV Screening  Completed   HPV VACCINES  Aged Out   DTaP/Tdap/Td  Discontinued   FOOT EXAM  Discontinued   OPHTHALMOLOGY EXAM  Discontinued    Health Maintenance  Health Maintenance Due  Topic Date Due   Pneumonia Vaccine 43+ Years old (1 of 2 - PCV) Never done   Zoster Vaccines- Shingrix (1 of 2) Never done    MAMMOGRAM  11/12/2016   Cervical Cancer Screening (HPV/Pap Cotest)  11/02/2017   Colonoscopy  07/21/2018   DEXA SCAN  02/01/2023   HEMOGLOBIN A1C  02/18/2023   INFLUENZA VACCINE  03/05/2023   COVID-19 Vaccine (1 - 2023-24 season) Never done    Colorectal cancer screening: Type of screening: Colonoscopy. Completed 07/21/08. Repeat every 10 years pt declined   Mammogram status: Ordered 08/23/22. Pt provided with contact info and advised to call to schedule appt.      Additional Screening:  Hepatitis C Screening: 12/21/14 Completed   Vision Screening: Recommended annual ophthalmology exams for early detection of glaucoma and other disorders of the eye. Is the patient up to date with their annual eye exam?  No  Who is the provider or what is the name of the office in which the patient attends annual eye exams? Pt stated she will follow up with provider  If pt is not established with a provider, would they like to be referred to a provider to establish care? No .   Dental Screening: Recommended annual dental exams for proper oral hygiene  Diabetic Foot Exam: Diabetic Foot Exam: Overdue, Pt has been advised about the importance in completing  this exam. Pt is scheduled for diabetic foot exam on next appt .  Community Resource Referral / Chronic Care Management: CRR required this visit?  No   CCM required this visit?  No     Plan:     I have personally reviewed and noted the following in the patient's chart:   Medical and social history Use of alcohol, tobacco or illicit drugs  Current medications and supplements including opioid prescriptions. Patient is not currently taking opioid prescriptions. Functional ability and status Nutritional status Physical activity Advanced directives List of other physicians Hospitalizations, surgeries, and ER visits in previous 12 months Vitals Screenings to include cognitive, depression, and falls Referrals and appointments  In  addition, I have reviewed and discussed with patient certain preventive protocols, quality metrics, and best practice recommendations. A written personalized care plan for preventive services as well as general preventive health recommendations were provided to patient.     Marzella Schlein, LPN   4/69/6295   After Visit Summary: (MyChart) Due to this being a telephonic visit, the after visit summary with patients personalized plan was offered to patient via MyChart   Nurse Notes: none

## 2023-04-28 NOTE — Patient Instructions (Signed)
Ms. Oestreicher , Thank you for taking time to come for your Medicare Wellness Visit. I appreciate your ongoing commitment to your health goals. Please review the following plan we discussed and let me know if I can assist you in the future.   Referrals/Orders/Follow-Ups/Clinician Recommendations: stay strong and healthy  This is a list of the screening recommended for you and due dates:  Health Maintenance  Topic Date Due   Pneumonia Vaccine (1 of 2 - PCV) Never done   Zoster (Shingles) Vaccine (1 of 2) Never done   Mammogram  11/12/2016   Pap with HPV screening  11/02/2017   Colon Cancer Screening  07/21/2018   DEXA scan (bone density measurement)  02/01/2023   Hemoglobin A1C  02/18/2023   Flu Shot  03/05/2023   COVID-19 Vaccine (1 - 2023-24 season) Never done   Yearly kidney function blood test for diabetes  08/21/2023   Yearly kidney health urinalysis for diabetes  08/21/2023   Medicare Annual Wellness Visit  04/27/2024   Hepatitis C Screening  Completed   HIV Screening  Completed   HPV Vaccine  Aged Out   DTaP/Tdap/Td vaccine  Discontinued   Complete foot exam   Discontinued   Eye exam for diabetics  Discontinued    Advanced directives: (Declined) Advance directive discussed with you today. Even though you declined this today, please call our office should you change your mind, and we can give you the proper paperwork for you to fill out.  Next Medicare Annual Wellness Visit scheduled for next year: Yes

## 2023-05-18 ENCOUNTER — Inpatient Hospital Stay: Admit: 2023-05-18 | Payer: MEDICARE | Primary: Physician Assistant

## 2023-05-18 DIAGNOSIS — M25561 Pain in right knee: Secondary | ICD-10-CM

## 2023-05-18 NOTE — Other (Signed)
[facility] - [address]  [address]  [place], [place] [address]  [phone] fax    Plan of Care / Statement of Necessity for Physical Therapy Services     Patient Name: [name] Short DOB: 1956-02-16   Medical   Diagnosis: Pain in right knee [M25.561]  Pain in left knee [M25.562]  History of falling [Z91.81] Treatment Diagnosis: R26.89  Abnormalities of gait and mobility  Pain in right knee [M25.561]  Pain in left knee [M25.562]  History of falling [Z91.81]      Onset Date: chronic Payor :  Payor: BCBS MEDICARE / Plan: ANTHE[place] [employer] [place] HEALTHKEEPERS DUAL / Product Type: *No Product type* /    Referral Source: Tonia Brooms, MD Start of Care [facility]): 05/18/2023   Prior Hospitalization: See medical history Provider #: [phone]   Prior Level of Function: Needs assistance from daughter or aide to complete ADLs   Comorbidities: CVA, DM, Fall risk     Assessment / key information:    Pt is a 67 y.o F who presents to initial PT evaluation who complaints of chronic bilateral anterior knee pain for the last 5 years with insidious onset and chronic gait/balance instability. Pt has a history of multiple falls in the last 6 months due to her knees giving out or navigating stairs. Pertinent findings include overall decreased functional mobility in sit to stand transfers, decreased gait speed, decreased static balance especially in narrow base of stance contributing to high fall risk. Pt also has a history of CVA effecting functional strength and cognitive abilities. Pt will benefit with a course of skilled PT to address aforementioned impairments in order to improve quality of life and improve functional mobility to complete ADLs and increase participation at home and the community.    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 Most recent fall occurred last week falling out of the couch. Requires assistance from her daughter to return back to standing.    Pt reports aggravating of chronic bilateral knee pain after a fall down the stairs (3 steps) that occurred in June 2024. She has been dealing with knee pain over the last 5 years.    Pt has the most difficulty with ADLs of dressing, bathing, and cooking activities. Her standing tolerance is about 5-10 minutes      In May 2023, patient reports having stroke heavily effecting her strength, mobility, and cognitive function.    Objective:    Observation/Posture:   Excessive forward head and forward trunk lean but is able to maintain upright posture for a short amount of time.    Gait:   Significantly decreased speed, staggered steps  with RW  10 MWT: 32 seconds with CGA    Balance:  NBOS: 5 seconds  NBOS EC: 2-3 seconds   Tandem: LOB, MOD A  Functional Screen:   Sit to stand: bilateral UE support. Slow speed, decreased eccentric control  5 time sit to stand: 40 seconds in standard chair, bilateral UE support      Evaluation Complexity:  History:  HIGH Complexity :3+ comorbidities / personal factors will impact the outcome/ POC ; Examination:  LOW Complexity : 1-2 Standardized tests and measures addressing body structure, function, activity limitation and / or participation in recreation  ;Presentation:  LOW Complexity : Stable, uncomplicated  ;Clinical Decision Making:  Lower Extremity Functional Scale (LEFS) = 23/80 ; (< 40 Moderate to Severe Dysfunction) = HIGH Complexity   Overall Complexity Rating: LOW   Problem List: pain  affecting function, decrease ROM, decrease strength, impaired gait/balance, decrease ADL/functional abilities, decrease activity tolerance, decrease flexibility/joint mobility, and decrease transfer abilities    Treatment Plan may include any combination of the following: 65784 Therapeutic Exercise, 97112 Neuromuscular Re-Education, 97140 Manual Therapy, 97530 Therapeutic Activity, 97535 Self Care/Home Management, 97014 Electrical Stim unattended, [phone] Electrical Stim attended, and [phone] Gait Training  Patient / Family readiness to learn indicated by: asking questions, trying to perform skills, interest, return verbalization , and return demonstration   Persons(s) to be included in education: patient (P)  Barriers to Learning/Limitations: cognitive  Measures taken if barriers to learning present: none  Patient Goal (s): move more, stand upright  Patient Self Reported Health Status: fair  Rehabilitation Potential: fair    Short Term Goals: To be accomplished in  4  weeks:  1. Independent with HEP.  EVAL: NA  2. Decrease max pain 25-50% to assist with ADLs of prolong standing and walking activities  EVAL: 6/10    Long Term Goals: To be accomplished in  8 weeks:  1.  Decrease max pain 50-75% to assist with ADLs of prolong standing and walking activities  EVAL: 6/10  2.  Improve Lower Extremity Functional Scale (LEFS) 9 points  in order to show significant functional improvement.  EVAL: 23/80  3.  Pt will be able to complete 5 sit to stands with UE support in standard chair in 30 seconds to improve strength to complete sit to stand transfers.  EVAL:  40 seconds  4. Pt will complete in 25 seconds or less to demonstrate improved walking speed to decrease risk of falls.  EVAL: 32 seconds with RW CGA  Frequency / Duration:  Patient to be seen  1-2 times per week for 4-8  weeks:     Frequency / Duration: Patient would benefit from skilled PT 2 times per week for 16 sessions as needed in this certification period.   Goals will be assigned and reassessed every 10 visits/ 30 days per Medicare guidelines    Patient/ Caregiver education and instruction: Diagnosis, prognosis, self care, activity modification, and exercises [x]   Plan of care has been reviewed with PTA    Certification Period: 05/18/2023-07/13/2023    Lara Mulch, PT       05/18/2023       9:23 AM  ===================================================================  I certify that the above Therapy Services are being furnished while the patient is under my care. I agree with the treatment plan and certify that this therapy is necessary.    Physician's Signature:_________________________   DATE:_________   TIME:________                           Tonia Brooms, MD    ** Signature, Date and Time must be completed  for valid certification **  Please sign and return to [facility] - [address]  [address]  [place], [place] [address]  [phone] fax

## 2023-05-18 NOTE — Progress Notes (Addendum)
PHYSICAL / OCCUPATIONAL THERAPY - DAILY TREATMENT NOTE    Patient Name: [name] Short    Date: 05/18/2023    DOB: 08/01/1956  Insurance: Payor: BCBS MEDICARE / Plan: [place] [employer] D[place] / Product Type: *No Product type* /      Patient DOB verified YES   Visit #   Current / Total 1 16   Time   In / Out 3:30 4:10   Pain   In / Out 6 6   Subjective Functional Status/Changes: SEE POC     TREATMENT AREA =  Pain in right knee [M25.561]  Pain in left knee [M25.562]  Other abnormalities of gait and mobility [R26.89]     OBJECTIVE    40 min untimed eval    Therapeutic Procedures:    Tx Min Billable or 1:1 Min (if diff from Tx Min) Procedure, Rationale, Specifics     97110 Therapeutic Exercise (timed):  increase ROM, strength, coordination, balance, and proprioception to improve patient's ability to progress to PLOF and address remaining functional goals. (see flow sheet as applicable)     Details if applicable:         97535 Self Care/Home Management (timed):  improve patient knowledge and understanding of activity modification, diagnosis/prognosis, and physical therapy expectations, procedures and progression  to improve patient's ability to progress to PLOF and address remaining functional goals.  (see flow sheet as applicable)     Details if applicable:            Details if applicable:            Details if applicable:            Details if applicable:       [facility] BC Totals Reminder: bill using total billable min of TIMED therapeutic procedures (example: do not include dry needle or estim unattended, both untimed codes, in totals to left)  8-22 min = 1 unit; 23-37 min = 2 units; 38-52 min = 3 units; 53-67 min = 4 units; 68-82 min = 5 units   Total Total     [x]   Patient Education billed concurrently with other procedures   [x]  Review HEP    []  Progressed/Changed HEP, detail:    []  Other detail:       Objective Information/Functional Measures/Assessment    SEE POC    Patient will continue to benefit from  skilled PT / OT services to modify and progress therapeutic interventions, analyze and address functional mobility deficits, analyze and address ROM deficits, analyze and address strength deficits, analyze and address soft tissue restrictions, analyze and cue for proper movement patterns, analyze and modify for postural abnormalities, and instruct in home and community integration to address functional deficits and attain remaining goals.    Progress toward goals / Updated goals:  []   See Progress Note/Recertification    SEE POC    Next PN/ RC due 06/18/2023  Auth due (visit number/ date) Pending    PLAN  - Continue Plan of Care  - Upgrade activities as tolerated    Lara Mulch, PT    05/18/2023    4:21 PM  If an interpreting service was utilized for treatment of this patient, the contents of this document represent the material reviewed with the patient via the interpreter.     No future appointments.

## 2023-05-21 ENCOUNTER — Ambulatory Visit: Payer: MEDICARE | Attending: Pulmonary Disease | Primary: Physician Assistant

## 2023-05-25 ENCOUNTER — Other Ambulatory Visit: Payer: Self-pay

## 2023-05-25 DIAGNOSIS — I5042 Chronic combined systolic (congestive) and diastolic (congestive) heart failure: Secondary | ICD-10-CM

## 2023-05-25 DIAGNOSIS — Z8639 Personal history of other endocrine, nutritional and metabolic disease: Secondary | ICD-10-CM

## 2023-05-26 MED ORDER — SYNJARDY 5-500 MG PO TABS: 1.0000 | ORAL_TABLET | Freq: Two times a day (BID) | ORAL | 3 refills | Status: DC

## 2023-05-26 MED ORDER — FUROSEMIDE 40 MG PO TABS: 40.0000 mg | ORAL_TABLET | Freq: Two times a day (BID) | ORAL | 3 refills | Status: DC

## 2023-06-08 ENCOUNTER — Inpatient Hospital Stay: Admit: 2023-06-08 | Payer: MEDICARE | Primary: Physician Assistant

## 2023-06-08 DIAGNOSIS — M25561 Pain in right knee: Secondary | ICD-10-CM

## 2023-06-08 NOTE — Progress Notes (Signed)
PHYSICAL / OCCUPATIONAL THERAPY - DAILY TREATMENT NOTE    Patient Name: [name] Short    Date: 06/08/2023    DOB: 1955/09/19  Insurance: Payor: BCBS MEDICARE / Plan: [place] [employer] [employer] DUAL / Product Type: *No Product type* /      Patient DOB verified YES   Visit #   Current / Total 2 16   Time   In / Out 12:30 12:50   Pain   In / Out 6-7 6   Subjective Functional Status/Changes: Pt arrived 20 minutes late.    Pt reports both knees hurt.     TREATMENT AREA =  Pain in right knee [M25.561]  Pain in left knee [M25.562]  Other abnormalities of gait and mobility [R26.89]     OBJECTIVE      Therapeutic Procedures:    Tx Min Billable or 1:1 Min (if diff from Tx Min) Procedure, Rationale, Specifics   20  97110 Therapeutic Exercise (timed):  increase ROM, strength, coordination, balance, and proprioception to improve patient's ability to progress to PLOF and address remaining functional goals. (see flow sheet as applicable)     Details if applicable:         97112 Neuromuscular Re-Education (timed):  improve balance, coordination, kinesthetic sense, posture, core stability and proprioception to improve patient's ability to develop conscious control of individual muscles and awareness of position of extremities in order to progress to PLOF and address remaining functional goals. (see flow sheet as applicable)    Details if applicable:        97530 Therapeutic Activity (timed):  use of dynamic activities replicating functional movements to increase ROM, strength, coordination, balance, and proprioception in order to improve patient's ability to progress to PLOF and address remaining functional goals.  (see flow sheet as applicable)      Details if applicable:            Details if applicable:            Details if applicable:     20  [facility] BC Totals Reminder: bill using total billable min of TIMED therapeutic procedures (example: do not include dry needle or estim unattended, both untimed codes, in totals to  left)  8-22 min = 1 unit; 23-37 min = 2 units; 38-52 min = 3 units; 53-67 min = 4 units; 68-82 min = 5 units   Total Total     [x]   Patient Education billed concurrently with other procedures   [x]  Review HEP    []  Progressed/Changed HEP, detail:    []  Other detail:       Objective Information/Functional Measures/Assessment    Pt arrived 20 minutes late limiting overall session. Pt presents to therapy with increased bilateral knee pain.Pt completed all activities with good effort today but decreased speed throughout especially with transitional movements. No increased pain with all activities. Pt able to complete sit to stands on hi-low table independently. Progress as tolerated.    Patient will continue to benefit from skilled PT / OT services to modify and progress therapeutic interventions, analyze and address functional mobility deficits, analyze and address ROM deficits, analyze and address strength deficits, analyze and address soft tissue restrictions, analyze and cue for proper movement patterns, analyze and modify for postural abnormalities, and instruct in home and community integration to address functional deficits and attain remaining goals.    Progress toward goals / Updated goals:  []   See Progress Note/Recertification    Short Term Goals: To be  accomplished in  4  weeks:  1. Independent with HEP.  EVAL: NA  Current: trying to complete 06/08/2023 progressing  2. Decrease max pain 25-50% to assist with ADLs of prolong standing and walking activities  EVAL: 6/10     Long Term Goals: To be accomplished in  8 weeks:  1.  Decrease max pain 50-75% to assist with ADLs of prolong standing and walking activities  EVAL: 6/10  2.  Improve Lower Extremity Functional Scale (LEFS) 9 points  in order to show significant functional improvement.  EVAL: 23/80  3.  Pt will be able to complete 5 sit to stands with UE support in standard chair in 30 seconds to improve strength to complete sit to stand transfers.  EVAL:  40  seconds  4. Pt will complete in 25 seconds or less to demonstrate improved walking speed to decrease risk of falls.  EVAL: 32 seconds with RW CGA    Next PN/ RC due 06/18/2023  Auth due (visit number/ date) 8 visits 05/18/2023-07/16/2023    PLAN  - Continue Plan of Care  - Upgrade activities as tolerated    Lara Mulch, PT    06/08/2023    9:05 AM  If an interpreting service was utilized for treatment of this patient, the contents of this document represent the material reviewed with the patient via the interpreter.     Future Appointments   Date Time Provider Department Center   06/08/2023 12:10 PM [facility] PT [address] [facility]   06/10/2023 12:10 PM Jennette Dubin, PT [facility]   06/15/2023 12:10 PM [facility] [address] [facility]   06/17/2023 12:10 PM Jennette Dubin, PT [facility]   06/22/2023 12:10 PM [facility] PT [address] [facility]   06/24/2023 12:10 PM Jennette Dubin, PT [facility]   06/29/2023 12:10 PM Irwin Brakeman, [place] [facility]   07/01/2023 12:10 PM Irwin Brakeman, PT [facility]

## 2023-06-10 ENCOUNTER — Inpatient Hospital Stay: Admit: 2023-06-10 | Payer: MEDICARE | Primary: Physician Assistant

## 2023-06-10 NOTE — Progress Notes (Signed)
PHYSICAL / OCCUPATIONAL THERAPY - DAILY TREATMENT NOTE    Patient Name: [name] Short    Date: 06/10/2023    DOB: 01-Oct-1955  Insurance: Payor: BCBS MEDICARE / Plan: [place] [employer] [employer] DUAL / Product Type: *No Product type* /      Patient DOB verified YES   Visit #   Current / Total 3 16   Time   In / Out 3:55 4:33   Pain   In / Out 6 0   Subjective Functional Status/Changes: Pt reports random muscle spasms of bilateral knee that goes up to her hip. Pt states GI distress that caused her to be late today.     TREATMENT AREA =  Pain in right knee [M25.561]  Pain in left knee [M25.562]  Other abnormalities of gait and mobility [R26.89]     OBJECTIVE      Therapeutic Procedures:    Tx Min Billable or 1:1 Min (if diff from Tx Min) Procedure, Rationale, Specifics   15  97110 Therapeutic Exercise (timed):  increase ROM, strength, coordination, balance, and proprioception to improve patient's ability to progress to PLOF and address remaining functional goals. (see flow sheet as applicable)     Details if applicable:       13  97112 Neuromuscular Re-Education (timed):  improve balance, coordination, kinesthetic sense, posture, core stability and proprioception to improve patient's ability to develop conscious control of individual muscles and awareness of position of extremities in order to progress to PLOF and address remaining functional goals. (see flow sheet as applicable)    Details if applicable:     10   97530 Therapeutic Activity (timed):  use of dynamic activities replicating functional movements to increase ROM, strength, coordination, balance, and proprioception in order to improve patient's ability to progress to PLOF and address remaining functional goals.  (see flow sheet as applicable)      Details if applicable:            Details if applicable:            Details if applicable:     38  [facility] BC Totals Reminder: bill using total billable min of TIMED therapeutic procedures (example: do not include  dry needle or estim unattended, both untimed codes, in totals to left)  8-22 min = 1 unit; 23-37 min = 2 units; 38-52 min = 3 units; 53-67 min = 4 units; 68-82 min = 5 units   Total Total     [x]   Patient Education billed concurrently with other procedures   [x]  Review HEP    []  Progressed/Changed HEP, detail:    []  Other detail:       Objective Information/Functional Measures/Assessment  Session limited due to patient in bathroom for prolong time mid session. Pt continues to progress with functional strengthening exercises to improve muscle performance. Increased repetitions with sit to stands. Poor tolerance with postural correction activities. Pt requires Min to Mod A with step ups on 4in platform. Finished session at [employer]. Pt reminded of next appointments and encouraged to arrive on time to complete session.    Patient will continue to benefit from skilled PT / OT services to modify and progress therapeutic interventions, analyze and address functional mobility deficits, analyze and address ROM deficits, analyze and address strength deficits, analyze and address soft tissue restrictions, analyze and cue for proper movement patterns, analyze and modify for postural abnormalities, and instruct in home and community integration to address functional deficits and attain remaining  goals.    Progress toward goals / Updated goals:  []   See Progress Note/Recertification    Short Term Goals: To be accomplished in  4  weeks:  1. Independent with HEP.  EVAL: NA  Current: trying to complete 06/08/2023 progressing  2. Decrease max pain 25-50% to assist with ADLs of prolong standing and walking activities  EVAL: 6/10  Currently: no change in pain levels 06/10/2023     Long Term Goals: To be accomplished in  8 weeks:  1.  Decrease max pain 50-75% to assist with ADLs of prolong standing and walking activities  EVAL: 6/10  2.  Improve Lower Extremity Functional Scale (LEFS) 9 points  in order to show significant functional  improvement.  EVAL: 23/80  3.  Pt will be able to complete 5 sit to stands with UE support in standard chair in 30 seconds to improve strength to complete sit to stand transfers.  EVAL:  40 seconds  4. Pt will complete in 25 seconds or less to demonstrate improved walking speed to decrease risk of falls.  EVAL: 32 seconds with RW CGA    Next PN/ RC due 06/18/2023  Auth due (visit number/ date) 5 visits 05/18/2023-07/16/2023    PLAN  - Continue Plan of Care  - Upgrade activities as tolerated    Lara Mulch, PT    06/10/2023    3:52 PM  If an interpreting service was utilized for treatment of this patient, the contents of this document represent the material reviewed with the patient via the interpreter.     Future Appointments   Date Time Provider Department Center   06/15/2023 12:10 PM [facility] PT [address] [facility]   06/17/2023 12:10 PM Jennette Dubin, PT [facility]   06/22/2023 12:10 PM [facility] PT [address] [facility]   06/24/2023 12:10 PM Jennette Dubin, PT [facility]   06/29/2023 12:10 PM Irwin Brakeman, [place] [facility]   07/01/2023 12:10 PM Irwin Brakeman, [place] [facility]

## 2023-06-15 ENCOUNTER — Inpatient Hospital Stay: Admit: 2023-06-15 | Payer: MEDICARE | Primary: Physician Assistant

## 2023-06-15 NOTE — Other (Signed)
[facility] - [address]  [address]  [place], [place] [address]  [phone] fax    CONTINUED PLAN OF CARE/RECERTIFICATION FOR PHYSICAL THERAPY          Patient Name: [name] Short DOB: 12-18-1955   Treatment/Medical Diagnosis: Pain in right knee [M25.561]  Pain in left knee [M25.562]  Other abnormalities of gait and mobility [R26.89]   Onset Date: chronic    Referral Source: Tonia Brooms, MD Start of Care [facility]): 05/18/2023   Prior Hospitalization: See Medical History Provider #: [phone]   Prior Level of Function: Needs assistance from daughter or aide to complete ADLs    Comorbidities: CVA, DM, Fall risk    Vis[facility] from [facility]: 4 Missed Visits: 0     Progress to Goals:  Short Term Goals: To be accomplished in  4  weeks:  1. Independent with HEP.  EVAL: NA  Current: inconsistent with HEP11/06/2023 progressing  2. Decrease max pain 25-50% to assist with ADLs of prolong standing and walking activities  EVAL: 6/10  Currently: 10/10 regression 06/15/2023     Long Term Goals: To be accomplished in  8 weeks:  1.  Decrease max pain 50-75% to assist with ADLs of prolong standing and walking activities  EVAL: 6/10  Currently: 10/10 regression 06/15/2023  2.  Improve Lower Extremity Functional Scale (LEFS) 9 points  in order to show significant functional improvement.  EVAL: 23/80  Currently:22/80 regression 06/15/2023  3.  Pt will be able to complete 5 sit to stands with UE support in standard chair in 30 seconds to improve strength to complete sit to stand transfers.  EVAL:  40 seconds  Currently: 1 minute and 9 seconds with UE support in standard chair 06/15/2023  4. Pt will complete in 25 seconds or less to demonstrate improved walking speed to decrease risk of falls.  EVAL: 32 seconds with RW CGA  Currently: assessed next visit    Objective:  5 time sit to stand: 1 minute and 9 seconds with UE support in standard chair 06/15/2023     Right A/PROM MMT Left A/PROM MMT   Hip  Flexion (0-120) 100 4-/5 100 4-/5   Hip Extension (0-20) 5 degrees 2/5 5 degrees 2/5   Hip ER (0-45) 25 4-/5 25 4-/5   Hip IR (0-35) 15 4-/5 15 4-/5   Hip Abduction (0-45) 20 3-/5 20 3-/5   Hip Adduction (0-35) NT NT NT NT   Knee Flexion (0-140) 110 5/5 110 5/5   Knee Extension (0-0) 0 5/5 0 5/5   Ankle DF [facility] 4/5 WFL 4/5   Ankle PF WFL 5/5 WFL 5/5     *denotes pain  If blank denotes not tested   Key Functional Changes/Progress:     Pt has made minimal progress in PT as demonstrated by decreased max pain levels at 10/10 and average pain level of 4.5/10. Pt has a history of late arrival to physical therapy appointments limiting overall session. Pt report 10% improvement since beginning therapy. Pt demonstrates no change with functional strength assessment of sit to stands and exercise tolerance since initial evalation. Pt ambulates with decreased speed with RW and forward flexion.Pt is easily fatigued and requires moderate encouragement to complete all activities.Pt reports continued difficulty with her balance especially to come up to standing and difficulties of general functional and walking activities. Pt notes having been performing the HEP as prescribed. Pt will continue to benefit from skilled  PT to progress exercises and improve upon?functional activities.     Problem List: pain affecting function, decrease ROM, decrease strength, edema affection function, impaired gait/balance, decrease ADL/functional abilities, decrease activity tolerance, decrease flexibility/joint mobility, and decrease transfer abilities    Treatment Plan may include any combination of the following: 19147 Therapeutic Exercise, 97112 Neuromuscular Re-Education, 97140 Manual Therapy, 97530 Therapeutic Activity, 97535 Self Care/Home Management, 97014 Electrical Stim unattended / [phone] [facility]), [id number] Electrical Stim attended, and [phone] Gait Training  Patient Goal(s) has been updated and includes:   Short Term Goals: To be accomplished in  4   weeks:  1. Independent with HEP.  EVAL: NA  Current: inconsistent with HEP11/06/2023 progressing  2. Decrease max pain 25-50% to assist with ADLs of prolong standing and walking activities  EVAL: 6/10  Currently: 10/10 regression 06/15/2023     Long Term Goals: To be accomplished in  8 weeks:  1.  Decrease max pain 50-75% to assist with ADLs of prolong standing and walking activities  EVAL: 6/10  Currently: 10/10 regression 06/15/2023  2.  Improve Lower Extremity Functional Scale (LEFS) 9 points  in order to show significant functional improvement.  EVAL: 23/80  Currently:22/80 regression 06/15/2023  3.  Pt will be able to complete 5 sit to stands with UE support in standard chair in 30 seconds to improve strength to complete sit to stand transfers.  EVAL:  40 seconds  Currently: 1 minute and 9 seconds with UE support in standard chair 06/15/2023  4. Pt will complete in 25 seconds or less to demonstrate improved walking speed to decrease risk of falls.  EVAL: 32 seconds with RW CGA  Currently: assessed next visit    Goals for this certification period include and are to be achieved in   4  weeks:    Continue to progress with aforementioned goals.    Frequency / Duration:   Patient to be seen   2   times per week for   4   weeks:  Frequency / Duration: Patient would benefit from skilled PT 2 times per week for 24 VISITS sessions as needed in this certification period.  Goals will be assigned and reassessed every 10 visits/ 30 days per Medicare guidelines      If you have any questions/comments please contact us directly at [phone].   Thank you for allowing [place] to assist in the care of your patient.    Certification Period: 06/15/2023-07/15/2023  Reporting Period (date from last assessment to current assessment): 05/18/2023-06/15/2023    Lara Mulch, PT       06/15/2023       9:14 AM      ___ I have read the above report and request that my patient continue as recommended.   ___ I have read the above report  and request that my patient continue therapy with the following changes/special instructions: ________________________________________________   ___ I have read the above report and request that my patient be discharged from therapy.     Physician's Signature:_________________________   DATE:_________   TIME:________                           Tonia Brooms, MD    ** Signature, Date and Time must be completed for valid certification **  Please sign and return to [facility] or you may fax the signed copy to [phone]  Thank you.

## 2023-06-15 NOTE — Progress Notes (Signed)
PHYSICAL / OCCUPATIONAL THERAPY - DAILY TREATMENT NOTE    Patient Name: [name] Short    Date: 06/15/2023    DOB: [birth year]  Insurance: Payor: BCBS MEDICARE / Plan: [place] [employer] [employer] DUAL / Product Type: *No Product type* /      Patient DOB verified YES   Visit #   Current / Total 4 16   Time   In / Out 12:20 12:50   Pain   In / Out 6 6   Subjective Functional Status/Changes: SEE RC     TREATMENT AREA =  Pain in right knee [M25.561]  Pain in left knee [M25.562]  Other abnormalities of gait and mobility [R26.89]     OBJECTIVE      Therapeutic Procedures:    Tx Min Billable or 1:1 Min (if diff from Tx Min) Procedure, Rationale, Specifics   15  97110 Therapeutic Exercise (timed):  increase ROM, strength, coordination, balance, and proprioception to improve patient's ability to progress to PLOF and address remaining functional goals. (see flow sheet as applicable)     Details if applicable:         97112 Neuromuscular Re-Education (timed):  improve balance, coordination, kinesthetic sense, posture, core stability and proprioception to improve patient's ability to develop conscious control of individual muscles and awareness of position of extremities in order to progress to PLOF and address remaining functional goals. (see flow sheet as applicable)    Details if applicable:     15   97530 Therapeutic Activity (timed):  use of dynamic activities replicating functional movements to increase ROM, strength, coordination, balance, and proprioception in order to improve patient's ability to progress to PLOF and address remaining functional goals.  (see flow sheet as applicable)      Details if applicable:            Details if applicable:            Details if applicable:     30  [facility] BC Totals Reminder: bill using total billable min of TIMED therapeutic procedures (example: do not include dry needle or estim unattended, both untimed codes, in totals to left)  8-22 min = 1 unit; 23-37 min = 2 units; 38-52 min =  3 units; 53-67 min = 4 units; 68-82 min = 5 units   Total Total     [x]   Patient Education billed concurrently with other procedures   [x]  Review HEP    []  Progressed/Changed HEP, detail:    []  Other detail:       Objective Information/Functional Measures/Assessment  SEE RC    Patient will continue to benefit from skilled PT / OT services to modify and progress therapeutic interventions, analyze and address functional mobility deficits, analyze and address ROM deficits, analyze and address strength deficits, analyze and address soft tissue restrictions, analyze and cue for proper movement patterns, analyze and modify for postural abnormalities, and instruct in home and community integration to address functional deficits and attain remaining goals.    Progress toward goals / Updated goals:  []   See Progress Note/Recertification  SEE RC    Next PN/ RC due 07/15/2023  Auth due (visit number/ date) 5 visits 05/18/2023-07/16/2023    PLAN  - Continue Plan of Care  - Upgrade activities as tolerated    Lara Mulch, PT    06/15/2023    9:14 AM  If an interpreting service was utilized for treatment of this patient, the contents of this document represent the material  reviewed with the patient via the interpreter.     Future Appointments   Date Time Provider Department Center   06/15/2023 12:10 PM [facility] PT [address] [facility]   06/17/2023 12:10 PM Jennette Dubin, PT [facility]   06/22/2023 12:10 PM [facility] PT [address] [facility]   06/24/2023 12:10 PM Jennette Dubin, PT [facility]   06/29/2023 12:10 PM Irwin Brakeman, [place] [facility]   07/01/2023 12:10 PM Irwin Brakeman, [place] [facility]

## 2023-06-17 ENCOUNTER — Inpatient Hospital Stay: Admit: 2023-06-17 | Payer: MEDICARE | Primary: Physician Assistant

## 2023-06-17 NOTE — Progress Notes (Signed)
 PHYSICAL / OCCUPATIONAL THERAPY - DAILY TREATMENT NOTE    Patient Name: [name] Short    Date: 06/17/2023    DOB: Oct 31, 1955  Insurance: Payor: BCBS MEDICARE / Plan: [place] [employer] [employer] [place] / Product Type: *No Product type* /      Patient DOB verified Yes     Visit #   Current / Total 1 8   Time   In / Out 1212 1250   Pain   In / Out 6 5   Subjective Functional Status/Changes: "My knees are hurting and swollen."     TREATMENT AREA =  Pain in right knee [M25.561]  Pain in left knee [M25.562]  Other abnormalities of gait and mobility [R26.89]     OBJECTIVE         Therapeutic Procedures:    Tx Min Billable or 1:1 Min (if diff from Tx Min) Procedure, Rationale, Specifics   10 10 97110 Therapeutic Exercise (timed):  increase ROM, strength, coordination, balance, and proprioception to improve patient's ability to progress to PLOF and address remaining functional goals. (see flow sheet as applicable)     Details if applicable:       18 18 97112 Neuromuscular Re-Education (timed):  improve balance, coordination, kinesthetic sense, posture, core stability and proprioception to improve patient's ability to develop conscious control of individual muscles and awareness of position of extremities in order to progress to PLOF and address remaining functional goals. (see flow sheet as applicable)     Details if applicable:     10 10 97530 Therapeutic Activity (timed):  use of dynamic activities replicating functional movements to increase ROM, strength, coordination, balance, and proprioception in order to improve patient's ability to progress to PLOF and address remaining functional goals.  (see flow sheet as applicable)     Details if applicable:     38 38 MC BC Totals Reminder: bill using total billable min of TIMED therapeutic procedures (example: do not include dry needle or estim unattended, both untimed codes, in totals to left)  8-22 min = 1 unit; 23-37 min = 2 units; 38-52 min = 3 units; 53-67 min = 4  units; 68-82 min = 5 units   Total Total     [x]   Patient Education billed concurrently with other procedures   [x]  Review HEP    []  Progressed/Changed HEP, detail:    []  Other detail:       Objective Information/Functional Measures/Assessment    Pt was able to increase her standing tolerance today, but was fatigued following exercises. Continues to have a shuffled gait and excessive forward flexion with ambulation. Needed cuing throughout for foot clearance and to stay inside of her FWW while ambulating. Challenged with balance activities with poor safety awareness. Also cuing needed for safety with transfers.     Patient will continue to benefit from skilled PT / OT services to modify and progress therapeutic interventions, analyze and address functional mobility deficits, analyze and address ROM deficits, analyze and address strength deficits, analyze and address soft tissue restrictions, analyze and cue for proper movement patterns, analyze and modify for postural abnormalities, analyze and address imbalance/dizziness, and instruct in home and community integration to address functional deficits and attain remaining goals.    Progress toward goals / Updated goals:  []   See Progress Note/Recertification    Short Term Goals: To be accomplished in  4  weeks:  1. Independent with HEP.  EVAL: NA  Current: inconsistent with HEP11/06/2023 progressing  2.  Decrease max pain 25-50% to assist with ADLs of prolong standing and walking activities  EVAL: 6/10  Currently: 10/10 regression 06/15/2023     Long Term Goals: To be accomplished in  8 weeks:  1.  Decrease max pain 50-75% to assist with ADLs of prolong standing and walking activities  EVAL: 6/10  Currently: 10/10 regression 06/15/2023  2.  Improve Lower Extremity Functional Scale (LEFS) 9 points  in order to show significant functional improvement.  EVAL: 23/80  Currently:22/80 regression 06/15/2023  3.  Pt will be able to complete 5 sit to stands with UE support in  standard chair in 30 seconds to improve strength to complete sit to stand transfers.  EVAL:  40 seconds  Currently: 1 minute and 9 seconds with UE support in standard chair 06/15/2023  4. Pt will complete in 25 seconds or less to demonstrate improved walking speed to decrease risk of falls.  EVAL: 32 seconds with RW CGA  Currently: 27 seconds    Next PN/ RC due 07/14/2023  Auth due (visit number/ date) 3 remaining by 07/16/2023    PLAN  - Continue Plan of Care    Rayford Cake, PTA, CSCS    06/17/2023    12:19 PM  If an interpreting service was utilized for treatment of this patient, the contents of this document represent the material reviewed with the patient via the interpreter.     Future Appointments   Date Time Provider Department Center   06/22/2023 12:10 PM [facility] PT [address] [facility]   06/24/2023 12:10 PM Rayford Cake, PT [facility]   06/29/2023 12:10 PM Carliss Chess, [place] [facility]   07/01/2023 12:10 PM Carliss Chess, PT [facility]

## 2023-06-18 ENCOUNTER — Encounter: Payer: Self-pay | Admitting: Podiatry

## 2023-06-18 ENCOUNTER — Ambulatory Visit (INDEPENDENT_AMBULATORY_CARE_PROVIDER_SITE_OTHER): Payer: Medicare Other | Admitting: Podiatry

## 2023-06-18 DIAGNOSIS — M79675 Pain in left toe(s): Secondary | ICD-10-CM | POA: Diagnosis not present

## 2023-06-18 DIAGNOSIS — B351 Tinea unguium: Secondary | ICD-10-CM | POA: Diagnosis not present

## 2023-06-18 DIAGNOSIS — M79674 Pain in right toe(s): Secondary | ICD-10-CM | POA: Diagnosis not present

## 2023-06-18 DIAGNOSIS — L84 Corns and callosities: Secondary | ICD-10-CM

## 2023-06-18 DIAGNOSIS — E1142 Type 2 diabetes mellitus with diabetic polyneuropathy: Secondary | ICD-10-CM | POA: Diagnosis not present

## 2023-06-21 NOTE — Progress Notes (Signed)
  Subjective:  Patient ID: Julie Elliott, female    DOB: 11/10/1955,  MRN: [id number]  Chief Complaint  Patient presents with   [facility]    [facility] No cpmplants    67 y.o. female presents with the above complaint. History confirmed with patient.  She presents today for debridement of her nails and calluses which has been helpful for her in reducing her pain and improving function  Objective:  Physical Exam: warm, good capillary refill, no trophic changes or ulcerative lesions, normal DP and PT pulses, and sensory exam with diffuse polyneuropathy.  Bilateral submetatarsal 5 callus Left Foot: dystrophic yellowed discolored nail plates with subungual debris Right Foot: dystrophic yellowed discolored nail plates with subungual debris   Assessment:   1. Pain due to onychomycosis of toenails of both feet   2. Callus   3. Controlled type 2 diabetes mellitus with diabetic polyneuropathy, without long-term current use of insulin (HCC)      Plan:  Patient was evaluated and treated and all questions answered.   All symptomatic hyperkeratoses were safely debrided with a sterile #15 blade to patient's level of comfort without incident. We discussed preventative and palliative care of these lesions including supportive and accommodative shoegear, padding, prefabricated and custom molded accommodative orthoses, use of a pumice stone and lotions/creams daily.  Discussed the etiology and treatment options for the condition in detail with the patient.   Recommended debridement of the nails today. Sharp and mechanical debridement performed of all painful and mycotic nails today. Nails debrided in length and thickness using a nail nipper to level of comfort. Discussed treatment options including appropriate shoe gear. Follow up as needed for painful nails.    No follow-ups on file.

## 2023-06-22 ENCOUNTER — Inpatient Hospital Stay: Admit: 2023-06-22 | Payer: MEDICARE | Primary: Physician Assistant

## 2023-06-22 NOTE — Progress Notes (Signed)
 PHYSICAL / OCCUPATIONAL THERAPY - DAILY TREATMENT NOTE    Patient Name: [name] Short    Date: 06/22/2023    DOB: 05-Jul-1956  Insurance: Payor: BCBS MEDICARE / Plan: [place] [employer] [employer] [employer] [place] / Product Type: *No Product type* /      Patient DOB verified Yes     Visit #   Current / Total 2 8   Time   In / Out 1210 1250   Pain   In / Out 6 6   Subjective Functional Status/Changes: Pt reports today is not her day.     TREATMENT AREA =  Pain in right knee [M25.561]  Pain in left knee [M25.562]  Other abnormalities of gait and mobility [R26.89]     OBJECTIVE         Therapeutic Procedures:    Tx Min Billable or 1:1 Min (if diff from Tx Min) Procedure, Rationale, Specifics     97110 Therapeutic Exercise (timed):  increase ROM, strength, coordination, balance, and proprioception to improve patient's ability to progress to PLOF and address remaining functional goals. (see flow sheet as applicable)     Details if applicable:       15 15 97112 Neuromuscular Re-Education (timed):  improve balance, coordination, kinesthetic sense, posture, core stability and proprioception to improve patient's ability to develop conscious control of individual muscles and awareness of position of extremities in order to progress to PLOF and address remaining functional goals. (see flow sheet as applicable)     Details if applicable:     15 15 97530 Therapeutic Activity (timed):  use of dynamic activities replicating functional movements to increase ROM, strength, coordination, balance, and proprioception in order to improve patient's ability to progress to PLOF and address remaining functional goals.  (see flow sheet as applicable)     Details if applicable:     30 30 MC BC Totals Reminder: bill using total billable min of TIMED therapeutic procedures (example: do not include dry needle or estim unattended, both untimed codes, in totals to left)  8-22 min = 1 unit; 23-37 min = 2 units; 38-52 min = 3 units; 53-67 min = 4 units; 68-82  min = 5 units   Total Total     [x]   Patient Education billed concurrently with other procedures   [x]  Review HEP    []  Progressed/Changed HEP, detail:    []  Other detail:       Objective Information/Functional Measures/Assessment  Pt arrived 10 minutes late limited overall session. Pt continues to progress with functional LE strengthening exercises to improve muscle performance.Moderate instability with NBOS with best attempt of 10 seconds. Added seated rows with yellow theraband to improve upright posture. Finished session at [employer].    Patient will continue to benefit from skilled PT / OT services to modify and progress therapeutic interventions, analyze and address functional mobility deficits, analyze and address ROM deficits, analyze and address strength deficits, analyze and address soft tissue restrictions, analyze and cue for proper movement patterns, analyze and modify for postural abnormalities, analyze and address imbalance/dizziness, and instruct in home and community integration to address functional deficits and attain remaining goals.    Progress toward goals / Updated goals:  []   See Progress Note/Recertification    Short Term Goals: To be accomplished in  4  weeks:  1. Independent with HEP.  EVAL: NA  Current: inconsistent with HEP11/06/2023 progressing  2. Decrease max pain 25-50% to assist with ADLs of prolong standing and walking activities  EVAL: 6/10  Currently: 10/10 regression 06/15/2023     Long Term Goals: To be accomplished in  8 weeks:  1.  Decrease max pain 50-75% to assist with ADLs of prolong standing and walking activities  EVAL: 6/10  Currently: 10/10 regression 06/15/2023  2.  Improve Lower Extremity Functional Scale (LEFS) 9 points  in order to show significant functional improvement.  EVAL: 23/80  Currently:22/80 regression 06/15/2023  3.  Pt will be able to complete 5 sit to stands with UE support in standard chair in 30 seconds to improve strength to complete sit to stand  transfers.  EVAL:  40 seconds  Currently: 1 minute and 9 seconds with UE support in standard chair 06/15/2023  4. Pt will complete in 25 seconds or less to demonstrate improved walking speed to decrease risk of falls.  EVAL: 32 seconds with RW CGA  Currently: 27 seconds    Next PN/ RC due 07/14/2023  Auth due (visit number/ date) 2 remaining by 07/16/2023    PLAN  - Continue Plan of Care    [place], [place]   06/22/2023    9:05 AM  If an interpreting service was utilized for treatment of this patient, the contents of this document represent the material reviewed with the patient via the interpreter.     Future Appointments   Date Time Provider Department Center   06/22/2023 12:10 PM [facility] PT [address] [facility]   06/24/2023 12:10 PM Rayford Cake, PT [facility]   06/29/2023 12:10 PM Carliss Chess, [place] [facility]   07/01/2023 12:10 PM Carliss Chess, PT [facility]

## 2023-06-23 ENCOUNTER — Inpatient Hospital Stay: Admit: 2023-06-23 | Primary: Physician Assistant

## 2023-06-23 LAB — LABCORP SPECIMEN COLLECTION

## 2023-06-24 ENCOUNTER — Inpatient Hospital Stay: Admit: 2023-06-24 | Payer: MEDICARE | Primary: Physician Assistant

## 2023-06-24 NOTE — Progress Notes (Signed)
 PHYSICAL / OCCUPATIONAL THERAPY - DAILY TREATMENT NOTE    Patient Name: [name] Short    Date: 06/24/2023    DOB: 1955-12-12  Insurance: Payor: BCBS MEDICARE / Plan: [place] [employer] [employer] [employer] DUAL / Product Type: *No Product type* /      Patient DOB verified Yes     Visit #   Current / Total 3 8   Time   In / Out 1216 1250   Pain   In / Out 6 0   Subjective Functional Status/Changes: "My left knee hurts."     TREATMENT AREA =  Pain in right knee [M25.561]  Pain in left knee [M25.562]  Other abnormalities of gait and mobility [R26.89]     OBJECTIVE         Therapeutic Procedures:    Tx Min Billable or 1:1 Min (if diff from Tx Min) Procedure, Rationale, Specifics   10 10 97110 Therapeutic Exercise (timed):  increase ROM, strength, coordination, balance, and proprioception to improve patient's ability to progress to PLOF and address remaining functional goals. (see flow sheet as applicable)     Details if applicable:       14 14 97112 Neuromuscular Re-Education (timed):  improve balance, coordination, kinesthetic sense, posture, core stability and proprioception to improve patient's ability to develop conscious control of individual muscles and awareness of position of extremities in order to progress to PLOF and address remaining functional goals. (see flow sheet as applicable)     Details if applicable:     10 10 97530 Therapeutic Activity (timed):  use of dynamic activities replicating functional movements to increase ROM, strength, coordination, balance, and proprioception in order to improve patient's ability to progress to PLOF and address remaining functional goals.  (see flow sheet as applicable)     Details if applicable:     34 34 MC BC Totals Reminder: bill using total billable min of TIMED therapeutic procedures (example: do not include dry needle or estim unattended, both untimed codes, in totals to left)  8-22 min = 1 unit; 23-37 min = 2 units; 38-52 min = 3 units; 53-67 min = 4 units; 68-82 min =  5 units   Total Total     [x]   Patient Education billed concurrently with other procedures   [x]  Review HEP    []  Progressed/Changed HEP, detail:    []  Other detail:       Objective Information/Functional Measures/Assessment    Pt continues to have quite a bit of strength deficits in bilateral LE. Low activity tolerance noted. Able to perform bridging today on plinth, but with limited ROM. Continues to ambulate with excessive trunk flexion, downward gaze, and FWW that is raised too high. Pt has 1 remaining visit left on her authorization.     Patient will continue to benefit from skilled PT / OT services to modify and progress therapeutic interventions, analyze and address functional mobility deficits, analyze and address ROM deficits, analyze and address strength deficits, analyze and address soft tissue restrictions, analyze and cue for proper movement patterns, analyze and modify for postural abnormalities, analyze and address imbalance/dizziness, and instruct in home and community integration to address functional deficits and attain remaining goals.    Progress toward goals / Updated goals:  []   See Progress Note/Recertification    Short Term Goals: To be accomplished in  4  weeks:  1. Independent with HEP.  EVAL: NA  Current: inconsistent with HEP11/06/2023 progressing    2. Decrease max pain  25-50% to assist with ADLs of prolong standing and walking activities  EVAL: 6/10  Currently: 10/10 regression 06/15/2023     Long Term Goals: To be accomplished in  8 weeks:  1.  Decrease max pain 50-75% to assist with ADLs of prolong standing and walking activities  EVAL: 6/10  Currently: 10/10 regression 06/15/2023    2.  Improve Lower Extremity Functional Scale (LEFS) 9 points  in order to show significant functional improvement.  EVAL: 23/80  Currently:22/80 regression 06/15/2023    3.  Pt will be able to complete 5 sit to stands with UE support in standard chair in 30 seconds to improve strength to complete sit to  stand transfers.  EVAL:  40 seconds  Currently: 1 minute and 9 seconds with UE support in standard chair 06/15/2023    4. Pt will complete in 25 seconds or less to demonstrate improved walking speed to decrease risk of falls.  EVAL: 32 seconds with RW CGA  Currently: 27 seconds    Next PN/ RC due 07/14/2023  Auth due (visit number/ date) 1 remaining by 07/16/2023    PLAN  - Continue Plan of Care    Rayford Cake, PTA, CSCS    06/24/2023    12:00 PM  If an interpreting service was utilized for treatment of this patient, the contents of this document represent the material reviewed with the patient via the interpreter.     Future Appointments   Date Time Provider Department Center   06/24/2023 12:10 PM Rayford Cake, PT [facility]   06/29/2023 12:10 PM Carliss Chess, [place] [facility]   07/01/2023 12:10 PM Carliss Chess, [place] [facility]

## 2023-06-29 ENCOUNTER — Inpatient Hospital Stay: Admit: 2023-06-29 | Payer: MEDICARE | Primary: Physician Assistant

## 2023-06-29 NOTE — Progress Notes (Signed)
 PHYSICAL / OCCUPATIONAL THERAPY - DAILY TREATMENT NOTE    Patient Name: [name] Short    Date: 06/29/2023    DOB: 02/04/1956  Insurance: Payor: BCBS MEDICARE / Plan: [place] [employer] [employer] [employer] DUAL / Product Type: *No Product type* /      Patient DOB verified Yes     Visit #   Current / Total 4 8   Time   In / Out 1220 1250   Pain   In / Out 7 5   Subjective Functional Status/Changes: Really sore after last visit but did her HEP at home on Friday, Saturday. Rested on "Sunday.     TREATMENT AREA =  Pain in right knee [M25.561]  Pain in left knee [M25.562]  Other abnormalities of gait and mobility [R26.89]     OBJECTIVE         Therapeutic Procedures:    Tx Min Billable or 1:1 Min (if diff from Tx Min) Procedure, Rationale, Specifics   20 20 97110 Therapeutic Exercise (timed):  increase ROM, strength, coordination, balance, and proprioception to improve patient's ability to progress to PLOF and address remaining functional goals. (see flow sheet as applicable)     Details if applicable:       10 10 97530 Therapeutic Activity (timed):  use of dynamic activities replicating functional movements to increase ROM, strength, coordination, balance, and proprioception in order to improve patient's ability to progress to PLOF and address remaining functional goals.  (see flow sheet as applicable)     Details if applicable:     30"  30 MC BC Totals Reminder: bill using total billable min of TIMED therapeutic procedures (example: do not include dry needle or estim unattended, both untimed codes, in totals to left)  8-22 min = 1 unit; 23-37 min = 2 units; 38-52 min = 3 units; 53-67 min = 4 units; 68-82 min = 5 units   Total Total     [x]   Patient Education billed concurrently with other procedures   [x]  Review HEP    []  Progressed/Changed HEP, detail:    []  Other detail:       Objective Information/Functional Measures/Assessment    Pt tolerating session well despite elevated pain levels today. Continues to require assistance  with LE management with supine to sit & when getting into [place]. Continued with plinth exercises via glute sets & ball/band to improve bed mobility. Continued with elevated STST with patient continuing to demonstrate challenge with task. New round of authorized visits starts on 07/01/23.    Patient will continue to benefit from skilled PT / OT services to modify and progress therapeutic interventions, analyze and address functional mobility deficits, analyze and address ROM deficits, analyze and address strength deficits, analyze and address soft tissue restrictions, analyze and cue for proper movement patterns, analyze and modify for postural abnormalities, analyze and address imbalance/dizziness, and instruct in home and community integration to address functional deficits and attain remaining goals.    Progress toward goals / Updated goals:  []   See Progress Note/Recertification    Short Term Goals: To be accomplished in  4  weeks:  1. Independent with HEP.  EVAL: NA  Current: inconsistent with HEP11/06/2023 progressing     2. Decrease max pain 25-50% to assist with ADLs of prolong standing and walking activities  EVAL: 6/10  Currently: 10/10 at worst 06/29/2023     Long Term Goals: To be accomplished in  8 weeks:  1.  Decrease max pain 50-75% to assist  with ADLs of prolong standing and walking activities  EVAL: 6/10  Currently: 10/10 regression 06/15/2023     2.  Improve Lower Extremity Functional Scale (LEFS) 9 points  in order to show significant functional improvement.  EVAL: 23/80  Currently:22/80 regression 06/15/2023     3.  Pt will be able to complete 5 sit to stands with UE support in standard chair in 30 seconds to improve strength to complete sit to stand transfers.  EVAL:  40 seconds  Currently: 1 minute and 9 seconds with UE support in standard chair 06/15/2023     4. Pt will complete in 25 seconds or less to demonstrate improved walking speed to decrease risk of falls.  EVAL: 32 seconds with  RW CGA  Currently: 27 seconds       Next PN/ RC due 07/14/23  Auth due (visit number/ date) 4 more starting 07/01/23 - 07/30/23    PLAN  - Continue Plan of Care    [place], PT    06/29/2023    12:58 PM  If an interpreting service was utilized for treatment of this patient, the contents of this document represent the material reviewed with the patient via the interpreter.     Future Appointments   Date Time Provider Department Center   07/01/2023 12:10 PM [place], [place] [facility]

## 2023-07-01 ENCOUNTER — Inpatient Hospital Stay: Admit: 2023-07-01 | Payer: MEDICARE | Primary: Physician Assistant

## 2023-07-01 NOTE — Progress Notes (Signed)
 PHYSICAL / OCCUPATIONAL THERAPY - DAILY TREATMENT NOTE    Patient Name: [name] Short    Date: 07/01/2023    DOB: 1956-03-03  Insurance: Payor: BCBS MEDICARE / Plan: [employer] [place] [employer] / Product Type: *No Product type* /      Patient DOB verified Yes     Visit #   Current / Total 5 8   Time   In / Out 1226 1255   Pain   In / Out 7 7   Subjective Functional Status/Changes: States that she is hurting more today, was able to watch wicked yesterday.      TREATMENT AREA =  Pain in right knee [M25.561]  Pain in left knee [M25.562]  Other abnormalities of gait and mobility [R26.89]     OBJECTIVE         Therapeutic Procedures:    Tx Min Billable or 1:1 Min (if diff from Tx Min) Procedure, Rationale, Specifics   20 20 97110 Therapeutic Exercise (timed):  increase ROM, strength, coordination, balance, and proprioception to improve patient's ability to progress to PLOF and address remaining functional goals. (see flow sheet as applicable)     Details if applicable:       9 9 97530 Therapeutic Activity (timed):  use of dynamic activities replicating functional movements to increase ROM, strength, coordination, balance, and proprioception in order to improve patient's ability to progress to PLOF and address remaining functional goals.  (see flow sheet as applicable)     Details if applicable:     29 29 MC BC Totals Reminder: bill using total billable min of TIMED therapeutic procedures (example: do not include dry needle or estim unattended, both untimed codes, in totals to left)  8-22 min = 1 unit; 23-37 min = 2 units; 38-52 min = 3 units; 53-67 min = 4 units; 68-82 min = 5 units   Total Total     [x]   Patient Education billed concurrently with other procedures   [x]  Review HEP    []  Progressed/Changed HEP, detail:    []  Other detail:       Objective Information/Functional Measures/Assessment    Pt tolerating session well today, shortened session due to late arrival. Emphasizing more standing exercises today  to work on standing tolerance. Assessing 5TSTS time today with improvements noted during todays f/u compared to last RC. Still demonstrating increased T/S kyphosis posturing. Continue to work on standing tolerance & stability.     Patient will continue to benefit from skilled PT / OT services to modify and progress therapeutic interventions, analyze and address functional mobility deficits, analyze and address ROM deficits, analyze and address strength deficits, analyze and address soft tissue restrictions, analyze and cue for proper movement patterns, analyze and modify for postural abnormalities, analyze and address imbalance/dizziness, and instruct in home and community integration to address functional deficits and attain remaining goals.    Progress toward goals / Updated goals:  []   See Progress Note/Recertification    Short Term Goals: To be accomplished in  4  weeks:  1. Independent with HEP.  EVAL: NA  Current: inconsistent with HEP11/06/2023 progressing     2. Decrease max pain 25-50% to assist with ADLs of prolong standing and walking activities  EVAL: 6/10  Currently: 10/10 at worst 06/29/2023     Long Term Goals: To be accomplished in  8 weeks:  1.  Decrease max pain 50-75% to assist with ADLs of prolong standing and walking activities  EVAL: 6/10  Currently: 10/10 regression 06/15/2023     2.  Improve Lower Extremity Functional Scale (LEFS) 9 points  in order to show significant functional improvement.  EVAL: 23/80  Currently:22/80 regression 06/15/2023     3.  Pt will be able to complete 5 sit to stands with UE support in standard chair in 30 seconds to improve strength to complete sit to stand transfers.  EVAL:  40 seconds  Current: 57 seconds with UE support [Date assessed: 07/01/23]       4. Pt will complete in 25 seconds or less to demonstrate improved walking speed to decrease risk of falls.  EVAL: 32 seconds with RW CGA  Currently: 27 seconds    Next PN/ RC due 07/14/23  Auth due (visit  number/ date) 3 more til 07/30/23    PLAN  - Continue Plan of Care    [place], [place]    07/01/2023    12:40 PM  If an interpreting service was utilized for treatment of this patient, the contents of this document represent the material reviewed with the patient via the interpreter.     Future Appointments   Date Time Provider Department Center   07/08/2023 12:10 PM Audra Blend, PT [facility]   07/13/2023 12:10 PM Audra Blend, PT [facility]   07/15/2023 12:10 PM Pruitt, Cheryle Corral, PTA [facility]

## 2023-07-08 ENCOUNTER — Emergency Department: Admit: 2023-07-08 | Payer: MEDICARE | Primary: Physician Assistant

## 2023-07-08 ENCOUNTER — Inpatient Hospital Stay
Admit: 2023-07-08 | Disposition: A | Discharge status: Home / Self Care | Payer: MEDICAID | Source: Ambulatory Visit | Admitting: Specialist | Primary: Physician Assistant

## 2023-07-08 ENCOUNTER — Inpatient Hospital Stay: Admit: 2023-07-08 | Discharge: 2023-07-09 | Disposition: A | Discharge status: Home / Self Care | Payer: MEDICARE

## 2023-07-08 DIAGNOSIS — M25561 Pain in right knee: Secondary | ICD-10-CM

## 2023-07-08 DIAGNOSIS — M7989 Other specified soft tissue disorders: Secondary | ICD-10-CM

## 2023-07-08 DIAGNOSIS — R600 Localized edema: Secondary | ICD-10-CM

## 2023-07-08 LAB — BASIC METABOLIC PANEL
Anion Gap: 3 mmol/L (ref 3.0–18)
BUN/Creatinine Ratio: 27 — ABNORMAL HIGH (ref 12–20)
BUN: 26 mg/dL — ABNORMAL HIGH (ref 7.0–18)
CO2: 30 mmol/L (ref 21–32)
Calcium: 9.6 mg/dL (ref 8.5–10.1)
Chloride: 110 mmol/L (ref 100–111)
Creatinine: 0.96 mg/dL (ref 0.6–1.3)
Est, Glom Filt Rate: 66 mL/min/{1.73_m2} (ref >60)
Glucose: 132 mg/dL — ABNORMAL HIGH (ref 74–99)
Potassium: 4.1 mmol/L (ref 3.5–5.5)
Sodium: 143 mmol/L (ref 136–145)

## 2023-07-08 LAB — CBC WITH AUTO DIFFERENTIAL
Basophils %: 0 % (ref 0–2)
Basophils Absolute: 0 10*3/uL (ref 0.0–0.1)
Eosinophils %: 2 % (ref 0–5)
Eosinophils Absolute: 0.1 10*3/uL (ref 0.0–0.4)
Hematocrit: 37.1 % (ref 35.0–45.0)
Hemoglobin: 11.7 g/dL — ABNORMAL LOW (ref 12.0–16.0)
Immature Granulocytes %: 0 % (ref 0.0–0.5)
Immature Granulocytes Absolute: 0 10*3/uL (ref 0.00–0.04)
Lymphocytes %: 16 % — ABNORMAL LOW (ref 21–52)
Lymphocytes Absolute: 0.8 10*3/uL — ABNORMAL LOW (ref 0.9–3.6)
MCH: 25.9 pg (ref 24.0–34.0)
MCHC: 31.5 g/dL (ref 31.0–37.0)
MCV: 82.1 FL (ref 78.0–100.0)
MPV: 10.7 FL (ref 9.2–11.8)
Monocytes %: 14 % — ABNORMAL HIGH (ref 3–10)
Monocytes Absolute: 0.7 10*3/uL (ref 0.05–1.2)
Neutrophils %: 67 % (ref 40–73)
Neutrophils Absolute: 3.3 10*3/uL (ref 1.8–8.0)
Nucleated RBCs: 0 /100{WBCs}
Platelets: 211 10*3/uL (ref 135–420)
RBC: 4.52 M/uL (ref 4.20–5.30)
RDW: 13.5 % (ref 11.6–14.5)
WBC: 4.9 10*3/uL (ref 4.6–13.2)
nRBC: 0 10*3/uL (ref 0.00–0.01)

## 2023-07-08 LAB — URINALYSIS
Bilirubin, Urine: NEGATIVE
Blood, Urine: NEGATIVE
Glucose, Ur: NEGATIVE mg/dL
Ketones, Urine: NEGATIVE mg/dL
Leukocyte Esterase, Urine: NEGATIVE
Nitrite, Urine: NEGATIVE
Protein, UA: NEGATIVE mg/dL
Specific Gravity, UA: 1.01 (ref 1.005–1.030)
Urobilinogen, Urine: 0.2 EU/dL (ref 0.2–1.0)
pH, Urine: 7 (ref 5.0–8.0)

## 2023-07-08 LAB — BRAIN NATRIURETIC PEPTIDE: NT Pro-BNP: 239 pg/mL (ref 0–900)

## 2023-07-08 NOTE — ED Triage Notes (Signed)
 Pt presents to the ed with possible leg infection. Daughter with patient states that the patient was prescribed antibiotics for her legs, however, she is unsure if she consumed all of the meds due to the patient experiencing emesis whenever she has muscle spasms. Daughter states that she has a hx of lymphedema.

## 2023-07-08 NOTE — Discharge Instructions (Signed)
 Thank you for allowing me to take care of you today.    Your workup here in the emergency department has been very reassuring.    I would highly suggest you follow-up with your primary care provider in the morning    Please not hesitate to return the emergency department any new worsening or concerning signs or symptoms.

## 2023-07-08 NOTE — ED Notes (Signed)
Pt discharged at this time, provided pt with DC paperwork   Condition stable  Pt discharged to home  Education was completed   Teaching method used was discussion & handout  Understanding of teaching was good   Pt in no apparent distress, pain managed at this time   Pt discharged with family   All questions and concerns answered at this time

## 2023-07-08 NOTE — Progress Notes (Addendum)
 PHYSICAL / OCCUPATIONAL THERAPY - DAILY TREATMENT NOTE    Patient Name: [name] Short    Date: 07/08/2023    DOB: [birth year]  Insurance: Payor: BCBS MEDICARE / Plan: ANTHEM FULL DUAL ADVANTAGE 2 / Product Type: *No Product type* /      Patient DOB verified Yes     Visit #   Current / Total 6 8   Time   In / Out 12:24 12:55   Pain   In / Out 7 7   Subjective Functional Status/Changes: Pt reports she is hurting today     TREATMENT AREA =  Pain in right knee [M25.561]  Pain in left knee [M25.562]  Other abnormalities of gait and mobility [R26.89]     OBJECTIVE    Therapeutic Procedures:    Tx Min Billable or 1:1 Min (if diff from Tx Min) Procedure, Rationale, Specifics   16  97530 Therapeutic Activity (timed):  use of dynamic activities replicating functional movements to increase ROM, strength, coordination, balance, and proprioception in order to improve patient's ability to progress to PLOF and address remaining functional goals.  (see flow sheet as applicable)     Details if applicable:  functional standing exercises, pt education   16 (15 min bathroom break)  MC BC Totals Reminder: bill using total billable min of TIMED therapeutic procedures (example: do not include dry needle or estim unattended, both untimed codes, in totals to left)  8-22 min = 1 unit; 23-37 min = 2 units; 38-52 min = 3 units; 53-67 min = 4 units; 68-82 min = 5 units   Total Total     [x]   Patient Education billed concurrently with other procedures   [x]  Review HEP    []  Progressed/Changed HEP, detail:    []  Other detail:       Objective Information/Functional Measures/Assessment  Significantly abbreviated session today secondary to pt being 16 mins late and needing 15 min bathroom break. Pt education given regarding the pt's tardiness to therapy and importance of coming to therapy on time for improved outcomes. She reported increased right knee cramping after performing balancing exercises then needed to use the restroom. Also educated pt on  performance of HEP at home due to pt's limited time in therapy. Pt due for a reassess NV.     Patient will continue to benefit from skilled PT / OT services to modify and progress therapeutic interventions, analyze and address functional mobility deficits, analyze and address ROM deficits, analyze and address strength deficits, analyze and address soft tissue restrictions, analyze and cue for proper movement patterns, analyze and modify for postural abnormalities, analyze and address imbalance/dizziness, and instruct in home and community integration to address functional deficits and attain remaining goals.    Progress toward goals / Updated goals:  []   See Progress Note/Recertification  Short Term Goals: To be accomplished in  4  weeks:  1. Independent with HEP.  EVAL: NA  Current: not met, continued inconsistencies 07/08/2023     2. Decrease max pain 25-50% to assist with ADLs of prolong standing and walking activities  EVAL: 6/10  Currently: 10/10 at worst 06/29/2023     Long Term Goals: To be accomplished in  8 weeks:  1.  Decrease max pain 50-75% to assist with ADLs of prolong standing and walking activities  EVAL: 6/10  Currently: 10/10 regression 06/15/2023     2.  Improve Lower Extremity Functional Scale (LEFS) 9 points  in order to show significant functional improvement.  EVAL: 23/80  Currently:22/80 regression 06/15/2023     3.  Pt will be able to complete 5 sit to stands with UE support in standard chair in 30 seconds to improve strength to complete sit to stand transfers.  EVAL:  40 seconds  Current: 57 seconds with UE support [Date assessed: 07/01/23]    4. Pt will complete in 25 seconds or less to demonstrate improved walking speed to decrease risk of falls.  EVAL: 32 seconds with RW CGA  Currently: 27 seconds    Next PN/ RC due 07/14/23  Auth due (visit number/ date) 2 more til 07/30/23    PLAN  - Continue Plan of Care    Audra Blend, PT    07/08/2023    8:15 AM  If an interpreting service was  utilized for treatment of this patient, the contents of this document represent the material reviewed with the patient via the interpreter.     Future Appointments   Date Time Provider Department Center   07/13/2023 12:10 PM Audra Blend, PT [facility]   07/15/2023 12:10 PM Pruitt, Cheryle Corral, PTA [facility]

## 2023-07-08 NOTE — ED Provider Notes (Signed)
 [facility] EMERGENCY DEPT  EMERGENCY DEPARTMENT ENCOUNTER       Pt Name: [name] Short  MRN: [id number]  Birthdate 17-Jul-1956  Date of evaluation: 07/08/2023  PCP: Randal Bury D, PA  Note Started: 9:14 PM 07/08/23     CHIEF COMPLAINT       No chief complaint on file.       HISTORY OF PRESENT ILLNESS: 1 or more elements      History From: Patient and Patient's Daughter  HPI Limitations: None  Chronic Conditions: Type 2 diabetes, lymphedema, CVA,   Social Determinants affecting Dx or Tx: None    [name] Short is a 67 y.o. female who presents to ED with daughter with multiple complaints.  Her first is that she is concerned that her cellulitis of her lower extremities has not improved.  She has chronic swelling secondary to her lymphedema.  States she finished a round of antibiotics for cellulitis and feels like it is improved but was concerned because she vomited a few times while taking the antibiotics.  She denies any current nausea vomiting diarrhea or abdominal pain.  Her second complaint is her nasal congestion.  Somewhat and her family had a viral upper respiratory infection and she is concerned with that.  She denies any sore throat cough shortness of breath or chest pain lastly she is concerned about her left wrist.  States she fell and into the car and her left wrist hurts.  This happened a few days ago.  She denies any weakness in her left upper extremity but states she has some paresthesias from her mid forearm to her fingers.     Nursing Notes were all reviewed and agreed with or any disagreements were addressed in the HPI.      PHYSICAL EXAM      Vitals:    07/08/23 1604   BP: (!) 147/66   Pulse: 71   Resp: 18   Temp: 97.6 F (36.4 C)   TempSrc: Oral   SpO2: 98%   Weight: 68.5 kg (151 lb)   Height: 1.499 m (4\' 11" )     Physical Exam  Vitals and nursing note reviewed.   Constitutional:       Appearance: Normal appearance.   HENT:      Head: Normocephalic and atraumatic.      Mouth/Throat:      Mouth: Mucous membranes  are moist.   Eyes:      Extraocular Movements: Extraocular movements intact.      Conjunctiva/sclera: Conjunctivae normal.   Cardiovascular:      Rate and Rhythm: Normal rate and regular rhythm.      Pulses: Normal pulses.           Radial pulses are 2+ on the right side and 2+ on the left side.        Dorsalis pedis pulses are 2+ on the right side and 2+ on the left side.      Heart sounds: Normal heart sounds.   Pulmonary:      Effort: Pulmonary effort is normal.      Breath sounds: Normal breath sounds.   Abdominal:      Palpations: Abdomen is soft.      Tenderness: There is no abdominal tenderness.   Musculoskeletal:         General: Normal range of motion.      Cervical back: Normal range of motion and neck supple.      Right lower leg: Edema present.  Left lower leg: Edema present.      Comments: Small hyperpigmentation of the left leg with an area of approximately 2 cm in diameter in the distal lateral left lower extremity.  No fluctuance tenderness drainage or erythema.    +4 pitting edema in the bilateral lower extremities to the proximal shin.    Normal range of motion in the left upper extremity wrist included and hands and fingers.  Mild tenderness to the distal lateral radius.  No erythema ecchymosis or swelling.  No nerve palsy.  Sensation intact.  +2 radial pulses bilaterally   Skin:     General: Skin is warm and dry.   Neurological:      Mental Status: She is alert and oriented to person, place, and time.   Psychiatric:         Mood and Affect: Mood normal.         Behavior: Behavior normal.         Thought Content: Thought content normal.              EMERGENCY DEPARTMENT COURSE and DIFFERENTIAL DIAGNOSIS/MDM   Vitals:    Vitals:    07/08/23 1604   BP: (!) 147/66   Pulse: 71   Resp: 18   Temp: 97.6 F (36.4 C)   TempSrc: Oral   SpO2: 98%   Weight: 68.5 kg (151 lb)   Height: 1.499 m (4\' 11" )       Patient was given the following medications:  Medications - No data to display        Records  Reviewed (source and summary): Old medical records.  Nursing notes.        ED COURSE       Medial Decision Making:  DDX: Cellulitis, fracture, abscess, CHF exacerbation, viral illness, osteomyelitis, DVT, lymphedema, others    67 y.o. female  In evaluation of the above differential diagnosis, consideration was given to the following tests and treatments: Very kind pleasant and well-appearing 67 year old female here for multiple complaints.  First she wants to make sure her cellulitis is cleared up in her legs.  She has chronic lymphedema.  Second she fell into her car and has pain in her left wrist.  Third she has some nasal congestion.    On exam she does have +3 pitting edema to the proximal shins bilaterally.  She has no erythema or significant tenderness.  2+ DP pulses sensation intact.  No tenderness along the venous tract to suggest that this is a DVT.  Benign oropharyngeal exam.  No emesis of breath sounds.  No increased work of breathing.  Vitals very reassuring.  She has mild tenderness to the distal left radius    Workup includes no left shift or leukocytosis.  Chronic stable anemia.  BNP is 239.  No significant electrolyte abnormality.  No renal dysfunction.  No evidence of UTI.  Chest x-ray without acute abnormality.  My read of the left wrist and left tib-fib do not suggest any osteomyelitis or fracture.    Overall she is very well-appearing and pleased to hear about her reassuring workup.  Daughter is at the bedside and she is also in agreements for discharge.  Plan to follow-up with her primary care provider tomorrow.    I discussed each of these tests and considerations with the patient and family. They agree with the plan of discharge.      PAST HISTORY     Past Medical History:  Past Medical History:  Diagnosis Date    Anemia     Arthritis     Chronic pain     Legs and Shoulders    Diabetes (HCC)     Exposure to asbestos     History of blood transfusion     History of colon polyps     HTN  (hypertension)     Hx of blood clots     DVT    Hyperlipemia     Menopause     Pulmonary nodule     Serum calcium elevated     Sleep apnea     not using cpap    Stroke (HCC) 12/2021    left sided weakness       Past Surgical History:  Past Surgical History:   Procedure Laterality Date    BACK SURGERY N/A 06/24/2022    INCISION AND DRAINAGE /DEBRIDEMENT SACRAL DECUBITUS performed by Drena Gentile, DO at [facility] MAIN OR    CESAREAN SECTION  1987    COLONOSCOPY N/A 01/13/2017    COLONOSCOPY performed by Geneva Kerbs, MD at [facility] ENDOSCOPY    COLONOSCOPY      COLONOSCOPY N/A 04/08/2023    COLONOSCOPY DIAGNOSTIC performed by Newt Barefoot, MD at [facility] ENDOSCOPY    DILATION AND CURETTAGE OF UTERUS  1995    RECTAL SURGERY N/A 12/28/2021    SACRAL WOUND DEBRIDEMENT INCISION AND DRAINAGE performed by Drena Gentile, DO at [facility] MAIN OR    SPINE SURGERY N/A 12/09/2021    CERVICAL THREE/FOUR/FIVE/SIX LAMINECTOMY FUSION; C-ARM; STRYKER; EXT BONE GROWTH STIM; 23 HR performed by Alvino Aye, MD at [facility] MAIN OR       Family History:  Family History   Problem Relation Age of Onset    Diabetes Other 45        parent, NOS    Cancer Father     Hypertension Other 35        parent,NOS    Heart Disease Other 72        parent, NOS    Lung Disease Father        Social History:  Social History     Socioeconomic History    Marital status: Single   Tobacco Use    Smoking status: Former     Current packs/day: 0.00     Types: Cigarettes, Cigars     Quit date: 11/13/2021     Years since quitting: 1.6    Smokeless tobacco: Never   Vaping Use    Vaping status: Never Used   Substance and Sexual Activity    Alcohol use: Not Currently     Comment: occ    Drug use: Never     Social Determinants of Health     Financial Resource Strain: Low Risk  (01/05/2022)    Overall Financial Resource Strain (CARDIA)     Difficulty of Paying Living Expenses: Not hard at all   Food Insecurity: Food Insecurity Present (08/24/2022)    Hunger Vital Sign     Worried About  Running Out of Food in the Last Year: Sometimes true     Ran Out of Food in the Last Year: Sometimes true   Transportation Needs: No Transportation Needs (10/10/2022)    OASIS A1250: Transportation     Lack of Transportation (Medical): No     Lack of Transportation (Non-Medical): No     Patient Unable or Declines to Respond: No   Physical Activity: Inactive (01/05/2022)    Exercise Vital Sign  Days of Exercise per Week: 0 days     Minutes of Exercise per Session: 0 min   Stress: No Stress Concern Present (01/05/2022)    [employer] of Occupational Health - Occupational Stress Questionnaire     Feeling of Stress : Not at all   Social Connections: Feeling Socially Integrated (10/10/2022)    OASIS D0700: Social Isolation     Frequency of experiencing loneliness or isolation: Never   Intimate Partner Violence: Not At Risk (01/05/2022)    Humiliation, Afraid, Rape, and Kick questionnaire     Fear of Current or Ex-Partner: No     Emotionally Abused: No     Physically Abused: No     Sexually Abused: No   Housing Stability: Low Risk  (08/24/2022)    Housing Stability Vital Sign     Unable to Pay for Housing in the Last Year: No     Number of Places Lived in the Last Year: 1     Unstable Housing in the Last Year: No       Allergies:  Allergies   Allergen Reactions    Lactose Other (See Comments)     Intolerance       CURRENT MEDICATIONS      No current facility-administered medications for this encounter.     Current Outpatient Medications   Medication Sig Dispense Refill    Probiotic Product (PROBIOTIC BLEND PO) Take by mouth Daily      famotidine  (PEPCID ) 20 MG tablet TAKE 1 TABLET BY MOUTH ONCE A DAY FOR GERD      vitamin C (ASCORBIC ACID) 500 MG tablet Take 1 tablet by mouth daily      insulin  glargine (LANTUS ) 100 UNIT/ML injection vial Inject 10 Units into the skin daily (Patient taking differently: Inject 20 Units into the skin daily) 1 each 0    insulin  lispro (HUMALOG ) 100 UNIT/ML SOLN injection vial Inject 3 Units  into the skin 3 times daily (with meals) 1 each 0    hydrALAZINE  (APRESOLINE ) 25 MG tablet Take 1 tablet by mouth every 8 hours (Patient taking differently: Take 1 tablet by mouth 4 times daily As needed elevated SBP <140) 90 tablet 3    potassium chloride  (KLOR-CON  M) 10 MEQ extended release tablet Take 1 tablet by mouth daily      cinacalcet  (SENSIPAR ) 30 MG tablet Take 1 tablet by mouth 2 times daily      carvedilol  (COREG ) 6.25 MG tablet Take 1 tablet by mouth 2 times daily (with meals) 60 tablet 3    baclofen  (LIORESAL ) 10 MG tablet Take 1 tablet by mouth 3 times daily as needed (PAIN) Indications: Pain FOR PAIN PER SHETH      Magnesium  Oxide 400 MG CAPS Take 1 capsule by mouth daily.      aspirin  325 MG tablet Take 1 tablet by mouth daily      atorvastatin  (LIPITOR ) 80 MG tablet Take 1 tablet by mouth nightly 30 tablet 3    ferrous sulfate  (IRON  325) 325 (65 Fe) MG tablet Take 1 tablet by mouth daily 30 tablet     Multiple Vitamins-Minerals (THERAPEUTIC MULTIVITAMIN-MINERALS) tablet Take 1 tablet by mouth daily      furosemide  (LASIX ) 40 MG tablet furosemide  40 mg tablet   TAKE 1 TABLET BY MOUTH EVERY DAY            DIAGNOSTIC RESULTS   LABS:    Recent Results (from the past 24 hour(s))   Urinalysis  Collection Time: 07/08/23  5:25 PM   Result Value Ref Range    Color, UA YELLOW      Appearance CLEAR      Specific Gravity, UA 1.010 1.005 - 1.030      pH, Urine 7.0 5.0 - 8.0      Protein, UA Negative NEG mg/dL    Glucose, Ur Negative NEG mg/dL    Ketones, Urine Negative NEG mg/dL    Bilirubin, Urine Negative NEG      Blood, Urine Negative NEG      Urobilinogen, Urine 0.2 0.2 - 1.0 EU/dL    Nitrite, Urine Negative NEG      Leukocyte Esterase, Urine Negative NEG     CBC with Auto Differential    Collection Time: 07/08/23  5:30 PM   Result Value Ref Range    WBC 4.9 4.6 - 13.2 K/uL    RBC 4.52 4.20 - 5.30 M/uL    Hemoglobin 11.7 (L) 12.0 - 16.0 g/dL    Hematocrit 19.1 47.8 - 45.0 %    MCV 82.1 78.0 - 100.0 FL     MCH 25.9 24.0 - 34.0 PG    MCHC 31.5 31.0 - 37.0 g/dL    RDW 29.5 62.1 - 30.8 %    Platelets 211 135 - 420 K/uL    MPV 10.7 9.2 - 11.8 FL    Nucleated RBCs 0.0 0 PER 100 WBC    nRBC 0.00 0.00 - 0.01 K/uL    Neutrophils % 67 40 - 73 %    Lymphocytes % 16 (L) 21 - 52 %    Monocytes % 14 (H) 3 - 10 %    Eosinophils % 2 0 - 5 %    Basophils % 0 0 - 2 %    Immature Granulocytes % 0 0.0 - 0.5 %    Neutrophils Absolute 3.3 1.8 - 8.0 K/UL    Lymphocytes Absolute 0.8 (L) 0.9 - 3.6 K/UL    Monocytes Absolute 0.7 0.05 - 1.2 K/UL    Eosinophils Absolute 0.1 0.0 - 0.4 K/UL    Basophils Absolute 0.0 0.0 - 0.1 K/UL    Immature Granulocytes Absolute 0.0 0.00 - 0.04 K/UL    Differential Type AUTOMATED     BMP    Collection Time: 07/08/23  5:30 PM   Result Value Ref Range    Sodium 143 136 - 145 mmol/L    Potassium 4.1 3.5 - 5.5 mmol/L    Chloride 110 100 - 111 mmol/L    CO2 30 21 - 32 mmol/L    Anion Gap 3 3.0 - 18 mmol/L    Glucose 132 (H) 74 - 99 mg/dL    BUN 26 (H) 7.0 - 18 MG/DL    Creatinine 6.57 0.6 - 1.3 MG/DL    BUN/Creatinine Ratio 27 (H) 12 - 20      Est, Glom Filt Rate 66 >60 ml/min/1.49m2    Calcium 9.6 8.5 - 10.1 MG/DL   Brain Natriuretic Peptide    Collection Time: 07/08/23  5:30 PM   Result Value Ref Range    NT Pro-BNP 239 0 - 900 PG/ML       Labs Reviewed   CBC WITH AUTO DIFFERENTIAL - Abnormal; Notable for the following components:       Result Value    Hemoglobin 11.7 (*)     Lymphocytes % 16 (*)     Monocytes % 14 (*)     Lymphocytes Absolute 0.8 (*)  All other components within normal limits   BASIC METABOLIC PANEL - Abnormal; Notable for the following components:    Glucose 132 (*)     BUN 26 (*)     BUN/Creatinine Ratio 27 (*)     All other components within normal limits   BRAIN NATRIURETIC PEPTIDE   URINALYSIS         EKG: When ordered, EKG's are interpreted by the Emergency Department Provider in the absence of a cardiologist.  Please see their note for interpretation of EKG.   None  Read by me.       RADIOLOGY:  Non-plain film images such as CT, Ultrasound and MRI are read by the radiologist. Plain radiographic images are visualized and preliminarily interpreted by the ED Provider with the below findings:    No evidence of fracture or osteomyelitis on my read    Interpretation per the Radiologist below, if available at the time of this note:  XR CHEST PORTABLE   Final Result   No acute process identified.       Electronically signed by Sherrilee Doles      XR TIBIA FIBULA LEFT (2 VIEWS)    (Results Pending)   XR WRIST LEFT (MIN 3 VIEWS)    (Results Pending)           PROCEDURES   Unless otherwise noted below, none  Procedures         CRITICAL CARE TIME   none      FINAL IMPRESSION     1. Leg swelling            DISPOSITION/PLAN   DISPOSITION Decision To Discharge 07/08/2023 09:07:04 PM   DISPOSITION CONDITION Stable                PATIENT REFERRED TO:  No follow-up provider specified.       DISCHARGE MEDICATIONS:     Medication List        ASK your doctor about these medications      aspirin  325 MG tablet     atorvastatin  80 MG tablet  Commonly known as: LIPITOR   Take 1 tablet by mouth nightly     baclofen  10 MG tablet  Commonly known as: LIORESAL      carvedilol  6.25 MG tablet  Commonly known as: COREG   Take 1 tablet by mouth 2 times daily (with meals)     cinacalcet  30 MG tablet  Commonly known as: SENSIPAR      famotidine  20 MG tablet  Commonly known as: PEPCID      ferrous sulfate  325 (65 Fe) MG tablet  Commonly known as: IRON  325  Take 1 tablet by mouth daily     furosemide  40 MG tablet  Commonly known as: LASIX      hydrALAZINE  25 MG tablet  Commonly known as: APRESOLINE   Take 1 tablet by mouth every 8 hours     insulin  glargine 100 UNIT/ML injection vial  Commonly known as: LANTUS   Inject 10 Units into the skin daily     insulin  lispro 100 UNIT/ML Soln injection vial  Commonly known as: HUMALOG ,ADMELOG   Inject 3 Units into the skin 3 times daily (with meals)     Magnesium  Oxide 400 MG Caps     potassium  chloride 10 MEQ extended release tablet  Commonly known as: KLOR-CON  M     PROBIOTIC BLEND PO     therapeutic multivitamin-minerals tablet  Take 1 tablet by mouth daily     vitamin C 500 MG  tablet  Commonly known as: ASCORBIC ACID                   I am the Primary Clinician of Record.       (Please note that parts of this dictation were completed with voice recognition software. Quite often unanticipated grammatical, syntax, homophones, and other interpretive errors are inadvertently transcribed by the computer software. Please disregards these errors. Please excuse any errors that have escaped final proofreading.)       Cattie Tineo G, ACNP  07/08/23 2114

## 2023-07-08 NOTE — ED Notes (Signed)
 Report given to Leonard, [place].

## 2023-07-13 ENCOUNTER — Inpatient Hospital Stay: Admit: 2023-07-13 | Payer: MEDICAID | Primary: Physician Assistant

## 2023-07-13 NOTE — Progress Notes (Addendum)
 PT DISCHARGE DAILY NOTE AND SUMMARY 09-24    If signature is requested please return to:    [facility] [employer]  Fax: [phone]    Date:07/13/2023  Patient Name: [name] Short DOB: 1956-02-28   Treatment/Medical Diagnosis: Pain in right knee [M25.561]  Pain in left knee [M25.562]  Other abnormalities of gait and mobility [R26.89]   Onset Date: chronic     Referral Source: Bertell Broach, MD Start of Care [facility]): 05/18/2023   Prior Hospitalization: See Medical History Provider #: [phone]   Prior Level of Function: Needs assistance from daughter or aide to complete ADLs    Comorbidities: CVA, DM, Fall risk    Insurance: Payor: BCBS MEDICARE / Plan: ANTHEM FULL DUAL ADVANTAGE 2 / Product Type: *No Product type* /      Visits from Start of Care: 11        Missed Visits: 0    Medicare: Reporting Period (date from last assessment to current assessment): 06/15/2023-07/13/2023    Patient DOB verified yes     Visit #   Current / Total 7 8   Time   In / Out 12:15 12:58   Pain   In / Out 6 6   Subjective Functional Status/Changes: Pt to d/c today to HEP and start new order for her neck soon. Pt reports she went to the ED for leg pain but tests were negative for a DVT/infection.      TREATMENT AREA =  Pain in right knee [M25.561]  Pain in left knee [M25.562]  Other abnormalities of gait and mobility [R26.89]      OBJECTIVE  Therapeutic Procedures:    Tx Min Billable or 1:1 Min (if diff from Tx Min) Procedure, Rationale, Specifics   28  97530 Therapeutic Activity (timed):  use of dynamic activities replicating functional movements to increase ROM, strength, coordination, balance, and proprioception in order to improve patient's ability to progress to PLOF and address remaining functional goals.  (see flow sheet as applicable)     Details if applicable:  goals, placing cut tennis balls on the back legs of the pt's FWW with help from the pt's daughter to place onto the back legs (with no LOB with FWW afterwards)     15   97110 Therapeutic Exercise (timed):  increase ROM, strength, coordination, balance, and proprioception to improve patient's ability to progress to PLOF and address remaining functional goals. (see flow sheet as applicable)     Details if applicable:  exercises, pt education on importance of performing HEP at home to improve certain symptoms.   43  MC BC Totals Reminder: bill using total billable min of TIMED therapeutic procedures (example: do not include dry needle or estim unattended, both untimed codes, in totals to left)  8-22 min = 1 unit; 23-37 min = 2 units; 38-52 min = 3 units; 53-67 min = 4 units; 68-82 min = 5 units   Total Total       [x]   Patient Education billed concurrently with other procedures   [x]  Review HEP    []  Progressed/Changed HEP, detail:    []  Other detail:       Objective Information/Functional Measures/Assessment  At best pain: 5/10  At worst pain: 9/10  Pt was seen for 11 therapy sessions. Pt continues to have limitations with her gait speed, balance and fatigue. Pt fatigues quickly with standing and sit to stands. Pt educated on importance of continuing HEP at home to help with  her symptoms. Pt is d/c'ed from therapy at this time to HEP secondary to lack of progress and ability to independently perform HEP to manage current deficits.      GOALS  Short Term Goals: To be accomplished in  4  weeks:  1. Independent with HEP.  EVAL: NA  Current: not met, intermittent compliance 07/13/2023     2. Decrease max pain 25-50% to assist with ADLs of prolong standing and walking activities  EVAL: 6/10  Current: not met, 5-9/10 pain range 07/13/2023     Long Term Goals: To be accomplished in  8 weeks:  1.  Decrease max pain 50-75% to assist with ADLs of prolong standing and walking activities  EVAL: 6/10  Current: not met, 5-9/10 pain range 07/13/2023     2.  Improve Lower Extremity Functional Scale (LEFS) 9 points  in order to show significant functional improvement.  EVAL: 23/80  Current: not met,  23/80 points 07/13/2023     3.  Pt will be able to complete 5 sit to stands with UE support in standard chair in 30 seconds to improve strength to complete sit to stand transfers.  EVAL:  40 seconds  Current: not met, 57 seconds 07/13/2023    4. Pt will complete in 25 seconds or less to demonstrate improved walking speed to decrease risk of falls.  EVAL: 32 seconds with RW CGA  Current: not met, 48 seconds with FWW and SBA 07/13/2023      RECOMMENDATIONS  Discontinue therapy due to lack of appreciable progress towards goals.    Audra Blend, PT       07/13/2023       8:04 AM  If an interpreting service was utilized for treatment of this patient, the contents of this document represent the material reviewed with the patient via the interpreter.     Payor: BCBS MEDICARE / Plan: ANTHEM FULL DUAL ADVANTAGE 2 / Product Type: *No Product type* /      Not Medicaid Ins, no signature required for DC

## 2023-07-15 ENCOUNTER — Encounter: Payer: MEDICAID | Primary: Physician Assistant

## 2023-07-21 ENCOUNTER — Encounter: Admit: 2023-07-21

## 2023-07-21 DIAGNOSIS — G3184 Mild cognitive impairment, so stated: Secondary | ICD-10-CM

## 2023-08-04 ENCOUNTER — Inpatient Hospital Stay: Admit: 2023-08-04 | Payer: MEDICARE | Primary: Physician Assistant

## 2023-08-04 NOTE — Progress Notes (Signed)
 [facility] - [address]  [address]  [place], [place] [address]  [phone] fax    Plan of Care / Statement of Necessity for Physical Therapy Services     Patient Name: [name] Short DOB: [birth year]   Medical   Diagnosis: Neck pain [M54.2]  Other low back pain [M54.59] Treatment Diagnosis: M54.2  NECK PAIN and M54.59  OTHER LOWER BACK PAIN      Onset Date: Ref date: 05/07/23 Payor :  Payor: BCBS MEDICARE / Plan: ANTHEM FULL DUAL ADVANTAGE 2 / Product Type: *No Product type* /    Referral Source: Doylene Genet, MD Start of Care [facility]): 08/04/2023   Prior Hospitalization: See medical history Provider #: [id number]   Prior Level of Function: Assistance from care giver for most self care   Comorbidities:   Hx of CVA (2023), DM, Arthritis, HTN, C/C fusion, Hx of sacral surgery for wound.        Subjective Info:     MOI: Has a Hx of neck surgery (laminectomy fusion) which has given her issues since surgery; she states that lower back pain is caused by arthritis & states that her recent fall prior to her eval for her knee during her last episode of care also exacerbated her back  Current Symptoms: left sided neck pain/tightness, she states that she has trouble looking up and rotating but both directions are limited. Ache pain in lower back & intermittent sharp pain when turning (more on her left side)  Current Current Pain: 6/10     Best Pain:  6/10     Worst Pain:  8/10  Constant/Intermittent: constant  Progression since onset?: waxes/wanes  Current Mobility: ambulating with FWW  Aggravating & Participation Limitations/Relieving factors: Difficultly reaching with UE due to neck pain, prolonged walking /standing, difficulty with car transfers, getting in/out of chair.  Previous Treatment: has had PT for her knees  Self Care: Impaired sleep due to pain, right hand dominant  Home- Environment: 3 STE, daughter/aid with patient all day.   Past Med Hx/Surgical Hx:Hx of CVA (2023), DM,  Arthritis, HTN, C/C fusion, Hx of sacral surgery for wound.   Allergies: Lactose  Motivation: good  Goals: "to be able to reach better."    Assessment / key information:  Pt is a pleasant 67 y.o. female who presents to PT with c/o neck pain & lower back pain. Pt demonstrates impaired AROM into C/S with most limitations seen with rotation and c/s extension which good be more related to her hx of cervical fusion. She demonstrates impaired postural awareness due to forward head, rounded shoulder & increased t/s kyphosis posturing. Weakness noted into bilateral shoulder flexion, ER, elbow flexion/extension with most weakness seen at right UE vs. Left UE. Limited mobility also present at right shoulder. Hip flexion and seated abduciton strength limited resulting in impaired lumbopelvic stability, her subjective hx suggest that she has a flexion bias as LE cramps seem to respond well to movements that cause L/S flexion. Pt's impairments have limited their ability to get in/out of chair, difficulty getting into car, unable to rotate head and look up to into cabinets; pain with overhead reaching during home chores. Pt would benefit from skilled PT to address above deficits in order to improve their ability to perfrom pain-free unrestricted ADLs/IADLs in order to enhance the Pt's overall quality of life.      Evaluation Complexity:  History:  HIGH Complexity :3+ comorbidities / personal factors will impact  the outcome/ POC ; Examination:  MEDIUM Complexity : 3 Standardized tests and measures addressin body structure, function, activity limitation and / or participation in recreation  ;Presentation:  MEDIUM Complexity : Evolving with changing characteristics  ;Clinical Decision Making:  Neck Disability Index (NDI) = 64 % ; (> 50% Severe Disability) = HIGH Complexity   Overall Complexity Rating: MEDIUM  Problem List: pain affecting function, decrease ROM, decrease strength, edema affection function, impaired gait/balance,  decrease ADL/functional abilities, decrease activity tolerance, decrease flexibility/joint mobility, and decrease transfer abilities    Treatment Plan may include any combination of the following: 16109 Therapeutic Exercise, 97112 Neuromuscular Re-Education, 97140 Manual Therapy, 97530 Therapeutic Activity, 97535 Self Care/Home Management, 97014 Electrical Stim unattended / [phone] [facility]), [phone] Electrical Stim attended, and [phone] Gait Training  Patient / Family readiness to learn indicated by: asking questions, trying to perform skills, interest, return verbalization , and return demonstration   Persons(s) to be included in education: patient (P)  Barriers to Learning/Limitations: none  Measures taken if barriers to learning present:   Patient Goal (s): "to be able to reach better."  Patient Self Reported Health Status: fair  Rehabilitation Potential: fair    Short Term Goals: To be accomplished in 2 weeks    Pt will be able to report a 6/10 pain rating at worst to improve patient's ability to tolerate prolonged functional activities at home. (Initial STG)   Eval: 8/10 at worst    Pt will be independent with HEP to facilitate carry-over of functional gains made in PT at home & community. (Initial STG)    Eval: established at eval    3. Pt will improve right shoulder flexion AROM to at least 120 degrees to improve their ability to perform reaching into her cabinet.  Eval: 110 degrees        Long Term Goals: To be accomplished in 5 weeks    Pt will be able to improve strength in bilateral shoulder flexion, elbow flexion/extension, ER to at least 5/5 to improve patient's ability to lift objects into cabinet at home & community. (Initial LTG)   Eval: Shoulder Flexion: Right = 4-/5, Left = 4/5; Elbow Flexion: right = 4-/5, left = 4+/5, Elbow extension: right = 4-/5, left = 4+/5    2. Pt will improve NDI (functional outcome name) score to 22 to demonstrate improvement in patient's ability to perform unrestricted ADLs/IADLs  at home & community. (Initial LTG)   Eval: 32 points    3. Pt will report compliance with home HEP at end of care to enhance carry over of fuctional gains made with skilled therapy services in order to improve quality of life. (Initial LTG)              Eval: Established first HEP at eval    4. Pt will improve her 5 time sit to stand time to least 45 seconds to demonstrate improvement in her functional LE strength & decrease her risk for falls at home.    Eval: 50 seconds    5. Pt will improve bilateral rotation AROM to at least 30 degrees to improve their ability to perform head turns before cross street.  Eval: right 24 degrees, left 20 degrees       Frequency / Duration: Patient would benefit from skilled PT 1-2 times per week for 24 VISITS sessions as needed in this certification period.  Goals will be assigned and reassessed every 10 visits/ 30 days per Medicare guidelines    Patient/  Caregiver education and instruction: Diagnosis, prognosis, self care, activity modification, and exercises [x]   Plan of care has been reviewed with PTA    Certification Period: 08/04/23 - 09/02/23    Carliss Chess, PT       08/04/2023       10:42 AM  ===================================================================  I certify that the above Therapy Services are being furnished while the patient is under my care. I agree with the treatment plan and certify that this therapy is necessary.    Physician's Signature:_________________________   DATE:_________   TIME:________                           Doylene Genet, MD    ** Signature, Date and Time must be completed for valid certification **  Please sign and return to [facility] - [address]  [address]  [place], [place] [address]  [phone] fax

## 2023-08-04 NOTE — Progress Notes (Signed)
 PHYSICAL / OCCUPATIONAL THERAPY - DAILY TREATMENT NOTE    Patient Name: [name] Short    Date: 08/04/2023    DOB: 30-Jul-1956  Insurance: Payor: BCBS MEDICARE / Plan: ANTHEM FULL DUAL ADVANTAGE 2 / Product Type: *No Product type* /      Patient DOB verified Yes     Visit #   Current / Total 1 10   Time   In / Out 1050 1100   Pain   In / Out 6 6   Subjective Functional Status/Changes: See POC     TREATMENT AREA =  Neck pain [M54.2]  Other low back pain [M54.59]    OBJECTIVE    40 min [x] Eval - untimed                             Therapeutic Procedures:    Tx Min Billable or 1:1 Min (if diff from [employer]) Procedure, Rationale, Specifics          Details if applicable:              Details if applicable:            Details if applicable:            Details if applicable:            Details if applicable:       [facility] BC Totals Reminder: bill using total billable min of TIMED therapeutic procedures (example: do not include dry needle or estim unattended, both untimed codes, in totals to left)  8-22 min = 1 unit; 23-37 min = 2 units; 38-52 min = 3 units; 53-67 min = 4 units; 68-82 min = 5 units   Total Total     [x]   Patient Education billed concurrently with other procedures   [x]  Review HEP    []  Progressed/Changed HEP, detail:    []  Other detail:       Objective Information/Functional Measures/Assessment    See POC    Patient will continue to benefit from skilled PT / OT services to modify and progress therapeutic interventions, analyze and address functional mobility deficits, analyze and address ROM deficits, analyze and address strength deficits, analyze and address soft tissue restrictions, analyze and cue for proper movement patterns, analyze and modify for postural abnormalities, analyze and address imbalance/dizziness, and instruct in home and community integration to address functional deficits and attain remaining goals.    Progress toward goals / Updated goals:  []   See Progress Note/Recertification    See  POC    Next PN/ RC due 09/02/23  Auth due pending    PLAN  - Continue Plan of Care    [place], [place]    08/04/2023    10:43 AM  If an interpreting service was utilized for treatment of this patient, the contents of this document represent the material reviewed with the patient via the interpreter.   Future Appointments   Date Time Provider Department Center   08/04/2023 10:50 AM Carliss Chess, PT [facility] [place] [facility]

## 2023-08-11 ENCOUNTER — Inpatient Hospital Stay
Admit: 2023-08-11 | Disposition: A | Discharge status: Home / Self Care | Payer: MEDICARE | Source: Ambulatory Visit | Primary: Physician Assistant

## 2023-08-11 DIAGNOSIS — M542 Cervicalgia: Secondary | ICD-10-CM

## 2023-08-11 NOTE — Progress Notes (Signed)
 PHYSICAL / OCCUPATIONAL THERAPY - DAILY TREATMENT NOTE    Patient Name: [name] Short    Date: 08/11/2023    DOB: 01/02/1956  Insurance: Payor: BCBS MEDICARE / Plan: ANTHEM FULL DUAL ADVANTAGE 2 / Product Type: *No Product type* /      Patient DOB verified Yes     Visit #   Current / Total 2 10   Time   In / Out 1217 1259   Pain   In / Out 6 5   Subjective Functional Status/Changes: Hurting today, neck and back. Just found HEP this morning.      TREATMENT AREA =    Neck pain [M54.2]  Other low back pain [M54.59]    OBJECTIVE:    Therapeutic Procedures:    Tx Min Billable or 1:1 Min (if diff from Tx Min) Procedure, Rationale, Specifics   15   97110 Therapeutic Exercise (timed):  increase ROM, strength, coordination, balance, and proprioception to improve patient's ability to progress to PLOF and address remaining functional goals. (see flow sheet as applicable)       Details if applicable:       15   97112 Neuromuscular Re-Education (timed):  improve balance, coordination, kinesthetic sense, posture, core stability and proprioception to improve patient's ability to develop conscious control of individual muscles and awareness of position of extremities in order to progress to PLOF and address remaining functional goals. (see flow sheet as applicable)       Details if applicable:     12   97530 Therapeutic Activity (timed):  use of dynamic activities replicating functional movements to increase ROM, strength, coordination, balance, and proprioception in order to improve patient's ability to progress to PLOF and address remaining functional goals.  (see flow sheet as applicable)       Details if applicable:            Details if applicable:     42  [facility] BC Totals Reminder: bill using total billable min of TIMED therapeutic procedures (example: do not include dry needle or estim unattended, both untimed codes, in totals to left)  8-22 min = 1 unit; 23-37 min = 2 units; 38-52 min = 3 units; 53-67 min = 4 units; 68-82 min = 5  units   Total Total     [x]   Patient Education billed concurrently with other procedures   [x]  Review HEP    []  Progressed/Changed HEP, detail:    []  Other detail:       Objective Information/Functional Measures/Assessment:  Arrives 7 mins late, Daughter is present for most of therapy today    Today's initial visit goes well with max verbal and visual cueing for initiation of exercises, upright seated posture with B feet on floor for support. She initial demonstrates a posterior lean seated EOB, she has good balance seated EOB. She reports tolerating today's visit and responding well.      Assess her response to today's initial visit and encouragement to complete increased daily activity and compliance of initial HEP.     Patient will continue to benefit from skilled PT / OT services to modify and progress therapeutic interventions, analyze and address functional mobility deficits, analyze and address ROM deficits, analyze and address strength deficits, analyze and address soft tissue restrictions, analyze and cue for proper movement patterns, analyze and modify for postural abnormalities, analyze and address imbalance/dizziness, and instruct in home and community integration to address functional deficits and attain remaining goals.    Progress  toward goals / Updated goals:  []   See Progress Note/Recertification  Short Term Goals: To be accomplished in 2 weeks  Pt will be able to report a 6/10 pain rating at worst to improve patient's ability to tolerate prolonged functional activities at home. (Initial STG)              Eval: 8/10 at worst     Pt will be independent with HEP to facilitate carry-over of functional gains made in PT at home & community. (Initial STG)              Eval: established at eval     3. Pt will improve right shoulder flexion AROM to at least 120 degrees to improve their ability to perform reaching into her cabinet.  Eval: 110 degrees       Long Term Goals: To be accomplished in 5 weeks  Pt  will be able to improve strength in bilateral shoulder flexion, elbow flexion/extension, ER to at least 5/5 to improve patient's ability to lift objects into cabinet at home & community. (Initial LTG)              Eval: Shoulder Flexion: Right = 4-/5, Left = 4/5; Elbow Flexion: right = 4-/5, left = 4+/5, Elbow extension: right = 4-/5, left = 4+/5     2. Pt will improve NDI (functional outcome name) score to 22 to demonstrate improvement in patient's ability to perform unrestricted ADLs/IADLs at home & community. (Initial LTG)              Eval: 32 points     3. Pt will report compliance with home HEP at end of care to enhance carry over of fuctional gains made with skilled therapy services in order to improve quality of life. (Initial LTG)              Eval: Established first HEP at eval     4. Pt will improve her 5 time sit to stand time to least 45 seconds to demonstrate improvement in her functional LE strength & decrease her risk for falls at home.               Eval: 50 seconds     5. Pt will improve bilateral rotation AROM to at least 30 degrees to improve their ability to perform head turns before cross street.  Eval: right 24 degrees, left 20 degrees       Next PN/ RC due 09/02/23  Auth due pending      PLAN  - Continue Plan of Care    Saryiah Bencosme, PTA    08/11/2023    8:01 AM  If an interpreting service was utilized for treatment of this patient, the contents of this document represent the material reviewed with the patient via the interpreter.   Future Appointments   Date Time Provider Department Center   08/11/2023 12:10 PM Alvy Journey, PTA [facility]   08/18/2023 10:10 AM Markel Silber, PTA [facility]   08/25/2023 10:10 AM Claudie Cull, PT [facility]

## 2023-08-18 ENCOUNTER — Inpatient Hospital Stay: Admit: 2023-08-18 | Payer: MEDICARE | Primary: Physician Assistant

## 2023-08-18 NOTE — Progress Notes (Signed)
 PHYSICAL / OCCUPATIONAL THERAPY - DAILY TREATMENT NOTE    Patient Name: [name] Short    Date: 08/18/2023    DOB: [birth year]  Insurance: Payor: BCBS MEDICARE / Plan: ANTHEM FULL DUAL ADVANTAGE 2 / Product Type: *No Product type* /      Patient DOB verified Yes     Visit #   Current / Total 3 10   Time   In / Out 1020 1050   Pain   In / Out 6 5   Subjective Functional Status/Changes: Pt reports feeling stiff in neck and lower back today.     TREATMENT AREA =  Neck pain [M54.2]  Other low back pain [M54.59]     OBJECTIVE         Therapeutic Procedures:    Tx Min Billable or 1:1 Min (if diff from Tx Min) Procedure, Rationale, Specifics   10  97110 Therapeutic Exercise (timed):  increase ROM, strength, coordination, balance, and proprioception to improve patient's ability to progress to PLOF and address remaining functional goals. (see flow sheet as applicable)     Details if applicable:       10  97112 Neuromuscular Re-Education (timed):  improve balance, coordination, kinesthetic sense, posture, core stability and proprioception to improve patient's ability to develop conscious control of individual muscles and awareness of position of extremities in order to progress to PLOF and address remaining functional goals. (see flow sheet as applicable)     Details if applicable:     10  97530 Therapeutic Activity (timed):  use of dynamic activities replicating functional movements to increase ROM, strength, coordination, balance, and proprioception in order to improve patient's ability to progress to PLOF and address remaining functional goals.  (see flow sheet as applicable)     Details if applicable:            Details if applicable:            Details if applicable:     30  [facility] BC Totals Reminder: bill using total billable min of TIMED therapeutic procedures (example: do not include dry needle or estim unattended, both untimed codes, in totals to left)  8-22 min = 1 unit; 23-37 min = 2 units; 38-52 min = 3 units; 53-67 min = 4  units; 68-82 min = 5 units   Total Total     [x]   Patient Education billed concurrently with other procedures   [x]  Review HEP    []  Progressed/Changed HEP, detail:    []  Other detail:       Objective Information/Functional Measures/Assessment  Pt reports feeling a little better from last tx session.Pt exhibits antalgic gait pattern with ambulating with FWW at this time Increased ability to transfer and ambulate with decreased difficulty noted at this time.Pt needed verbal and tactile cues with all activities standing in parallel bars today.Reviewed HEP with Pt and she was able to perform demo.Pt progressing towards all goals of PT.    Patient will continue to benefit from skilled PT / OT services to modify and progress therapeutic interventions, analyze and address functional mobility deficits, analyze and address ROM deficits, analyze and address strength deficits, analyze and address soft tissue restrictions, analyze and cue for proper movement patterns, analyze and modify for postural abnormalities, analyze and address imbalance/dizziness, and instruct in home and community integration to address functional deficits and attain remaining goals.    Progress toward goals / Updated goals:  []   See Progress Note/Recertification    Pt will  be able to report a 6/10 pain rating at worst to improve patient's ability to tolerate prolonged functional activities at home. (Initial STG)              Eval: 8/10 at worst     Pt will be independent with HEP to facilitate carry-over of functional gains made in PT at home & community. (Initial STG)              Eval: established at eval     3. Pt will improve right shoulder flexion AROM to at least 120 degrees to improve their ability to perform reaching into her cabinet.  Eval: 110 degrees       Long Term Goals: To be accomplished in 5 weeks  Pt will be able to improve strength in bilateral shoulder flexion, elbow flexion/extension, ER to at least 5/5 to improve patient's ability  to lift objects into cabinet at home & community. (Initial LTG)              Eval: Shoulder Flexion: Right = 4-/5, Left = 4/5; Elbow Flexion: right = 4-/5, left = 4+/5, Elbow extension: right = 4-/5, left = 4+/5     2. Pt will improve NDI (functional outcome name) score to 22 to demonstrate improvement in patient's ability to perform unrestricted ADLs/IADLs at home & community. (Initial LTG)              Eval: 32 points     3. Pt will report compliance with home HEP at end of care to enhance carry over of fuctional gains made with skilled therapy services in order to improve quality of life. (Initial LTG)              Eval: Established first HEP at eval     4. Pt will improve her 5 time sit to stand time to least 45 seconds to demonstrate improvement in her functional LE strength & decrease her risk for falls at home.               Eval: 50 seconds     5. Pt will improve bilateral rotation AROM to at least 30 degrees to improve their ability to perform head turns before cross street.  Eval: right 24 degrees, left 20 degrees    Next PN/ RC due 09/02/23  Auth due (visit number/ date) 08/03/24- 11/01/23  8 visits remaining    PLAN  - Continue Plan of Care    Larey Brick, PTA    08/18/2023    10:11 AM  If an interpreting service was utilized for treatment of this patient, the contents of this document represent the material reviewed with the patient via the interpreter.     Future Appointments   Date Time Provider Department Center   08/25/2023 10:10 AM Marlyne Beards, PT [facility]

## 2023-08-25 ENCOUNTER — Inpatient Hospital Stay: Admit: 2023-08-25 | Payer: MEDICARE | Primary: Physician Assistant

## 2023-08-25 NOTE — Progress Notes (Addendum)
PHYSICAL / OCCUPATIONAL THERAPY - DAILY TREATMENT NOTE    Patient Name: [name] Short    Date: 08/25/2023    DOB: 11/21/1955  Insurance: Payor: BCBS MEDICARE / Plan: ANTHEM FULL DUAL ADVANTAGE 2 / Product Type: *No Product type* /      Patient DOB verified Yes     Visit #   Current / Total 4 10   Time   In / Out 1026 1121   Pain   In / Out 7/10 5-6/10   Subjective Functional Status/Changes: "Of course I'm in pain"      TREATMENT AREA =  Neck pain [M54.2]  Other low back pain [M54.59]     OBJECTIVE    Therapeutic Procedures:    Tx Min Billable or 1:1 Min (if diff from Tx Min) Procedure, Rationale, Specifics   14 14 97110 Therapeutic Exercise (timed):  increase ROM, strength, coordination, balance, and proprioception to improve patient's ability to progress to PLOF and address remaining functional goals. (see flow sheet as applicable)     Details if applicable:       12 12 97112 Neuromuscular Re-Education (timed):  improve balance, coordination, kinesthetic sense, posture, core stability and proprioception to improve patient's ability to develop conscious control of individual muscles and awareness of position of extremities in order to progress to PLOF and address remaining functional goals. (see flow sheet as applicable)     Details if applicable:  hip stability, core re-ed, balance   14 14 97530 Therapeutic Activity (timed):  use of dynamic activities replicating functional movements to increase ROM, strength, coordination, balance, and proprioception in order to improve patient's ability to progress to PLOF and address remaining functional goals.  (see flow sheet as applicable)     Details if applicable:  elev STS, standing functional hip strength    40 40 MC BC Totals Reminder: bill using total billable min of TIMED therapeutic procedures (example: do not include dry needle or estim unattended, both untimed codes, in totals to left)  8-22 min = 1 unit; 23-37 min = 2 units; 38-52 min = 3 units; 53-67 min = 4 units;  68-82 min = 5 units   Total Total     [x]   Patient Education billed concurrently with other procedures   [x]  Review HEP    []  Progressed/Changed HEP, detail:    []  Other detail:       Objective Information/Functional Measures/Assessment    Patient participated in today's session without adverse effect. Fatigues with standing hip 4-way, but good carryover from last session. Cuing for upright posture with ambulation and standing exercises. Added seated "knee slaps" - marches with alternating opposite UE knee tap - for core control. Attempted 1/2 stance on 4" step, but cut-short 2/2 pt need to use bathroom; 10 min break in session to use bathroom. Added elevated STS from plinth for BLE functional strength; cuing for foot placement and leaning forward with stand up. DUE FOR PN NV; advised pt to arrive early to next appt.     Patient will continue to benefit from skilled PT / OT services to modify and progress therapeutic interventions, analyze and address functional mobility deficits, analyze and address ROM deficits, analyze and address strength deficits, analyze and address soft tissue restrictions, analyze and cue for proper movement patterns, analyze and modify for postural abnormalities, analyze and address imbalance/dizziness, and instruct in home and community integration to address functional deficits and attain remaining goals.    Progress toward goals / Updated goals:  []   See Progress Note/Recertification    Short Term Goals: To be accomplished in 2 weeks   Pt will be able to report a 6/10 pain rating at worst to improve patient's ability to tolerate prolonged functional activities at home. (Initial STG)              Eval: 8/10 at worst    Assess NV    Pt will be independent with HEP to facilitate carry-over of functional gains made in PT at home & community. (Initial STG)              Eval: established at eval    NOT MET; reports poor compliance with exercises at home (08/25/23)     3. Pt will improve right  shoulder flexion AROM to at least 120 degrees to improve their ability to perform reaching into her cabinet.  Eval: 110 degrees      Assess NV    Long Term Goals: To be accomplished in 5 weeks  Pt will be able to improve strength in bilateral shoulder flexion, elbow flexion/extension, ER to at least 5/5 to improve patient's ability to lift objects into cabinet at home & community. (Initial LTG)              Eval: Shoulder Flexion: Right = 4-/5, Left = 4/5; Elbow Flexion: right = 4-/5, left = 4+/5, Elbow extension: right = 4-/5, left = 4+/5    Assess NV    2. Pt will improve NDI (functional outcome name) score to 22 to demonstrate improvement in patient's ability to perform unrestricted ADLs/IADLs at home & community. (Initial LTG)              Eval: 32 points    Assess NV    3. Pt will report compliance with home HEP at end of care to enhance carry over of fuctional gains made with skilled therapy services in order to improve quality of life. (Initial LTG)              Eval: Established first HEP at eval    ONGOING; poor compliance with initial HEP; pt ed on sticking to a schedule at home for max therapy benefits (08/25/23)     4. Pt will improve her 5 time sit to stand time to least 45 seconds to demonstrate improvement in her functional LE strength & decrease her risk for falls at home.               Eval: 50 seconds  Added elevated STS without UE today (08/25/23)     Assess NV    5. Pt will improve bilateral rotation AROM to at least 30 degrees to improve their ability to perform head turns before cross street.  Eval: right 24 degrees, left 20 degrees   Assess NV    Next PN/ RC due 09/02/23  Auth due (visit number/ date) 7 visits remaining through 11/02/23    PLAN  - Continue Plan of Care    Marlyne Beards, PT    08/25/2023    10:29 AM  If an interpreting service was utilized for treatment of this patient, the contents of this document represent the material reviewed with the patient via the interpreter.     Future  Appointments   Date Time Provider Department Center   09/01/2023 12:10 PM Dorise Bullion [facility]

## 2023-09-01 ENCOUNTER — Inpatient Hospital Stay: Admit: 2023-09-01 | Payer: MEDICARE | Primary: Physician Assistant

## 2023-09-01 NOTE — Progress Notes (Signed)
[facility] - [address]  [address]  [place], [place] [address]  [phone] fax    CONTINUED PLAN OF CARE/RECERTIFICATION FOR PHYSICAL THERAPY          Patient Name: [name] Short DOB: September 30, 1955   Treatment/Medical Diagnosis: Neck pain [M54.2]  Other low back pain [M54.59]   Onset Date: Ref date: 05/07/23    Referral Source: Elmer Ramp, MD Start of Care [facility]): 08/04/23   Prior Hospitalization: See Medical History Provider #: [id number]   Prior Level of Function: Assistance from care giver for most self care    Comorbidities: Hx of CVA (2023), DM, Arthritis, HTN, C/C fusion, Hx of sacral surgery for wound.    Visits from [facility]: 5 Missed Visits: 0     Progress to Goals:  Short Term Goals: To be accomplished in 2 weeks   Pt will be able to report a 6/10 pain rating at worst to improve patient's ability to tolerate prolonged functional activities at home. (Initial STG)              Eval: 8/10 at worst              NOT MET: 8/10 at worst (09/01/23)     Pt will be independent with HEP to facilitate carry-over of functional gains made in PT at home & community. (Initial STG)              Eval: established at eval               NOT MET; reports poor compliance with exercises at home (08/25/23)      3. Pt will improve right shoulder flexion AROM to at least 120 degrees to improve their ability to perform reaching into her cabinet.  Eval: 110 degrees                MET: 135 deg R shoulder flex (09/01/23)     Long Term Goals: To be accomplished in 5 weeks  Pt will be able to improve strength in bilateral shoulder flexion, elbow flexion/extension, ER to at least 5/5 to improve patient's ability to lift objects into cabinet at home & community. (Initial LTG)              Eval: Shoulder Flexion: Right = 4-/5, Left = 4/5; Elbow Flexion: right = 4-/5, left = 4+/5, Elbow extension: right = 4-/5, left = 4+/5              PROGRESSED: shoulder flex R 4/5, L 4+/5; Elbow flex R 4+/5, L 5/5; Elbow  ext 5/5 B (09/01/23)      2. Pt will improve NDI (functional outcome name) score to 22 to demonstrate improvement in patient's ability to perform unrestricted ADLs/IADLs at home & community. (Initial LTG)              Eval: 32 points              PROGRESSED: 26 points (52%)      3. Pt will report compliance with home HEP at end of care to enhance carry over of fuctional gains made with skilled therapy services in order to improve quality of life. (Initial LTG)              Eval: Established first HEP at eval               ONGOING; poor compliance with initial HEP; pt ed on sticking to a schedule  at home for max therapy benefits (08/25/23)      4. Pt will improve her 5 time sit to stand time to least 45 seconds to demonstrate improvement in her functional LE strength & decrease her risk for falls at home.               Eval: 50 seconds  Added elevated STS without UE today (08/25/23)               NOT MET: 1 min 14 sec with BUE, with 2WW upon standing (09/01/23)      5. Pt will improve bilateral rotation AROM to at least 30 degrees to improve their ability to perform head turns before cross street.  Eval: right 24 degrees, left 20 degrees              MET: 34 deg B (09/01/23)     Key Functional Changes/Progress:   Functional Gains: do a little bit more with her neck, getting in/out of bed  Functional Deficits: Impaired balance in standing, looking up   % improvement: 20% improvements  Pain   Average: 6/10       Best: 5/10     Worst: 8/10  Patient Goal: "I want to be able to walk, I want to not have pain"    Problem List: pain affecting function, decrease ROM, decrease strength, edema affection function, impaired gait/balance, decrease ADL/functional abilities, decrease activity tolerance, decrease flexibility/joint mobility, and decrease transfer abilities    Treatment Plan may include any combination of the following: 73220 Therapeutic Exercise, 97112 Neuromuscular Re-Education, 97140 Manual Therapy, 97530  Therapeutic Activity, 97535 Self Care/Home Management, 97014 Electrical Stim unattended / [phone] [facility]), [phone] Electrical Stim attended, and [phone] Gait Training  Patient Goal(s) has been updated and includes:     Goals for this certification period include and are to be achieved in   5  weeks:    Pt will be able to improve strength in bilateral shoulder flexion, elbow flexion/extension, ER to at least 5/5 to improve patient's ability to lift objects into cabinet at home & community. (Initial LTG)              RC: shoulder flex R 4/5, L 4+/5; Elbow flex R 4+/5, L 5/5; Elbow ext 5/5 B (09/01/23)      2. Pt will improve NDI (functional outcome name) score to 22 to demonstrate improvement in patient's ability to perform unrestricted ADLs/IADLs at home & community. (Initial LTG)              RC: 26 points (52%)      3. Pt will report compliance with home HEP at end of care to enhance carry over of fuctional gains made with skilled therapy services in order to improve quality of life. (Initial LTG)              RC, ongoing; poor compliance with initial HEP; pt ed on sticking to a schedule at home for max therapy benefits (08/25/23)      4. Pt will improve her 5 time sit to stand time to least 45 seconds to demonstrate improvement in her functional LE strength & decrease her risk for falls at home.               RC: 1 min 14 sec with BUE, with 2WW upon standing (09/01/23)      5. Pt will improve bilateral c/s rotation AROM to at least 45 degrees to improve their ability to perform  head turns before cross street. (Updated goal)               RC: 34 deg B (09/01/23)     Frequency / Duration:   Patient to be seen   2   times per week for   5    weeks:  Frequency / Duration: Patient would benefit from skilled PT 2 times per week for 24 VISITS sessions as needed in this certification period.  Goals will be assigned and reassessed every 10 visits/ 30 days per Medicare guidelines    Assessments/Recommendations: Patient is a  pleasant 68 year old female with c/o neck and low back pain. Patient has completed x5 visits of skilled PT to address, reporting 20% improvement since [facility]. Reports functional gains including improved neck movement and improved ability to get in/out of bed. Reports functional deficits including neck pain with looking up and impaired balance in standing. Objectively, patient presents with improved c/s rotation AROM, R shoulder AROM, and BUE strength. Patient remains indicated as fall risk via 5xSTS. Patient will continue to benefit from skilled PT to address the remaining deficits and improve functional mobility, so that she can perform ADLs, transfers, and ambulation safely with minimal pain or limitation.       If you have any questions/comments please contact us directly at [phone].   Thank you for allowing [place] to assist in the care of your patient.    Certification Period: 09/01/23 - 09/30/23  Reporting Period (date from last assessment to current assessment): 08/04/23 - 09/01/23    Marlyne Beards, PT       09/01/2023       12:00 PM      ___ I have read the above report and request that my patient continue as recommended.   ___ I have read the above report and request that my patient continue therapy with the following changes/special instructions: ________________________________________________   ___ I have read the above report and request that my patient be discharged from therapy.     Physician's Signature:_________________________   DATE:_________   TIME:________                           Elmer Ramp, MD    ** Signature, Date and Time must be completed for valid certification **  Please sign and return to [facility] or you may fax the signed copy to [phone]  Thank you.

## 2023-09-01 NOTE — Progress Notes (Signed)
PHYSICAL / OCCUPATIONAL THERAPY - DAILY TREATMENT NOTE    Patient Name: [name] Short    Date: 09/01/2023    DOB: 1955/10/22  Insurance: Payor: BCBS MEDICARE / Plan: ANTHEM FULL DUAL ADVANTAGE 2 / Product Type: *No Product type* /      Patient DOB verified Yes     Visit #   Current / Total 5 10   Time   In / Out 1216 1257   Pain   In / Out 6/10 5/10   Subjective Functional Status/Changes: "I tried to close my drapes, but could not stand for long enough to do it"      TREATMENT AREA =  Neck pain [M54.2]  Other low back pain [M54.59]     OBJECTIVE    Therapeutic Procedures:    Tx Min Billable or 1:1 Min (if diff from Tx Min) Procedure, Rationale, Specifics   10 10 97110 Therapeutic Exercise (timed):  increase ROM, strength, coordination, balance, and proprioception to improve patient's ability to progress to PLOF and address remaining functional goals. (see flow sheet as applicable)     Details if applicable:       12 12 97112 Neuromuscular Re-Education (timed):  improve balance, coordination, kinesthetic sense, posture, core stability and proprioception to improve patient's ability to develop conscious control of individual muscles and awareness of position of extremities in order to progress to PLOF and address remaining functional goals. (see flow sheet as applicable)     Details if applicable:  scap re-ed, hip stability, core stability    18 18 97530 Therapeutic Activity (timed):  use of dynamic activities replicating functional movements to increase ROM, strength, coordination, balance, and proprioception in order to improve patient's ability to progress to PLOF and address remaining functional goals.  (see flow sheet as applicable)     Details if applicable:  progress update   40 40 MC BC Totals Reminder: bill using total billable min of TIMED therapeutic procedures (example: do not include dry needle or estim unattended, both untimed codes, in totals to left)  8-22 min = 1 unit; 23-37 min = 2 units; 38-52 min = 3  units; 53-67 min = 4 units; 68-82 min = 5 units   Total Total     [x]   Patient Education billed concurrently with other procedures   [x]  Review HEP    []  Progressed/Changed HEP, detail:    []  Other detail:       Objective Information/Functional Measures/Assessment    Patient is a pleasant 68 year old female with c/o neck and low back pain. Patient has completed x5 visits of skilled PT to address, reporting 20% improvement since [facility]. Reports functional gains including improved neck movement and improved ability to get in/out of bed. Reports functional deficits including neck pain with looking up and impaired balance in standing. Objectively, patient presents with improved c/s rotation AROM, R shoulder AROM, and BUE strength. Patient remains indicated as fall risk via 5xSTS. Patient will continue to benefit from skilled PT to address the remaining deficits and improve functional mobility, so that she can perform ADLs, transfers, and ambulation safely with minimal pain or limitation.     Patient will continue to benefit from skilled PT / OT services to modify and progress therapeutic interventions, analyze and address functional mobility deficits, analyze and address ROM deficits, analyze and address strength deficits, analyze and address soft tissue restrictions, analyze and cue for proper movement patterns, analyze and modify for postural abnormalities, analyze and address imbalance/dizziness, and  instruct in home and community integration to address functional deficits and attain remaining goals.    Progress toward goals / Updated goals:  []   See Progress Note/Recertification    Pt will be able to improve strength in bilateral shoulder flexion, elbow flexion/extension, ER to at least 5/5 to improve patient's ability to lift objects into cabinet at home & community. (Initial LTG)              RC: shoulder flex R 4/5, L 4+/5; Elbow flex R 4+/5, L 5/5; Elbow ext 5/5 B (09/01/23)      2. Pt will improve NDI (functional  outcome name) score to 22 to demonstrate improvement in patient's ability to perform unrestricted ADLs/IADLs at home & community. (Initial LTG)              RC: 26 points (52%)      3. Pt will report compliance with home HEP at end of care to enhance carry over of fuctional gains made with skilled therapy services in order to improve quality of life. (Initial LTG)              RC, ongoing; poor compliance with initial HEP; pt ed on sticking to a schedule at home for max therapy benefits (08/25/23)      4. Pt will improve her 5 time sit to stand time to least 45 seconds to demonstrate improvement in her functional LE strength & decrease her risk for falls at home.               RC: 1 min 14 sec with BUE, with 2WW upon standing (09/01/23)      5. Pt will improve bilateral c/s rotation AROM to at least 45 degrees to improve their ability to perform head turns before cross street. (Updated goal)               RC: 34 deg B (09/01/23)     Next PN/ RC due 09/30/23  Auth due (visit number/ date) 6 visits remaining through 11/02/23    PLAN  - Continue Plan of Care    Marlyne Beards, PT    09/01/2023    12:00 PM  If an interpreting service was utilized for treatment of this patient, the contents of this document represent the material reviewed with the patient via the interpreter.     Future Appointments   Date Time Provider Department Center   09/01/2023 12:10 PM Marlyne Beards, PT [facility]   09/08/2023 12:10 PM Jennette Dubin, PT [facility]   09/15/2023 12:10 PM Gerarda Gunther [facility]   09/22/2023 12:10 PM Dorise Bullion [facility]

## 2023-09-07 ENCOUNTER — Other Ambulatory Visit: Payer: Self-pay

## 2023-09-07 DIAGNOSIS — I5042 Chronic combined systolic (congestive) and diastolic (congestive) heart failure: Secondary | ICD-10-CM

## 2023-09-07 MED ORDER — POTASSIUM CHLORIDE CRYS ER 20 MEQ PO TBCR: 20.0000 meq | EXTENDED_RELEASE_TABLET | Freq: Every day | ORAL | 3 refills | Status: DC

## 2023-09-07 MED ORDER — SPIRONOLACTONE 25 MG PO TABS: 25.0000 mg | ORAL_TABLET | Freq: Every day | ORAL | 3 refills | Status: DC

## 2023-09-08 ENCOUNTER — Inpatient Hospital Stay: Admit: 2023-09-08 | Payer: MEDICARE | Primary: Physician Assistant

## 2023-09-08 DIAGNOSIS — M542 Cervicalgia: Secondary | ICD-10-CM

## 2023-09-08 NOTE — Progress Notes (Signed)
PHYSICAL / OCCUPATIONAL THERAPY - DAILY TREATMENT NOTE    Patient Name: [name] Short    Date: 09/08/2023    DOB: [birth year]  Insurance: Payor: BCBS MEDICARE / Plan: ANTHEM FULL DUAL ADVANTAGE 2 / Product Type: *No Product type* /      Patient DOB verified Yes     Visit #   Current / Total 1 10   Time   In / Out 1222 100   Pain   In / Out 6 6   Subjective Functional Status/Changes: "My shoulder is hurting."     TREATMENT AREA =  Neck pain [M54.2]  Other low back pain [M54.59]     OBJECTIVE         Therapeutic Procedures:    Tx Min Billable or 1:1 Min (if diff from Tx Min) Procedure, Rationale, Specifics   10 10 97110 Therapeutic Exercise (timed):  increase ROM, strength, coordination, balance, and proprioception to improve patient's ability to progress to PLOF and address remaining functional goals. (see flow sheet as applicable)     Details if applicable:       18 18 97112 Neuromuscular Re-Education (timed):  improve balance, coordination, kinesthetic sense, posture, core stability and proprioception to improve patient's ability to develop conscious control of individual muscles and awareness of position of extremities in order to progress to PLOF and address remaining functional goals. (see flow sheet as applicable)     Details if applicable:     10 10 97530 Therapeutic Activity (timed):  use of dynamic activities replicating functional movements to increase ROM, strength, coordination, balance, and proprioception in order to improve patient's ability to progress to PLOF and address remaining functional goals.  (see flow sheet as applicable)     Details if applicable:     38 38 MC BC Totals Reminder: bill using total billable min of TIMED therapeutic procedures (example: do not include dry needle or estim unattended, both untimed codes, in totals to left)  8-22 min = 1 unit; 23-37 min = 2 units; 38-52 min = 3 units; 53-67 min = 4 units; 68-82 min = 5 units   Total Total     [x]   Patient Education billed concurrently  with other procedures   [x]  Review HEP    []  Progressed/Changed HEP, detail:    []  Other detail:       Objective Information/Functional Measures/Assessment    Pt continues to make slow, but steady progress with her standing tolerance. Challenged with more dynamic balance activities in 1/2 stance. Unable to get hip flexion activation to do 8 inch step tap today so went back to 4 inch step. Continue to progress as able.    Patient will continue to benefit from skilled PT / OT services to modify and progress therapeutic interventions, analyze and address functional mobility deficits, analyze and address ROM deficits, analyze and address strength deficits, analyze and address soft tissue restrictions, analyze and cue for proper movement patterns, analyze and modify for postural abnormalities, analyze and address imbalance/dizziness, and instruct in home and community integration to address functional deficits and attain remaining goals.    Progress toward goals / Updated goals:  []   See Progress Note/Recertification    Pt will be able to improve strength in bilateral shoulder flexion, elbow flexion/extension, ER to at least 5/5 to improve patient's ability to lift objects into cabinet at home & community. (Initial LTG)              RC: shoulder flex R 4/5,  L 4+/5; Elbow flex R 4+/5, L 5/5; Elbow ext 5/5 B (09/01/23)      2. Pt will improve NDI (functional outcome name) score to 22 to demonstrate improvement in patient's ability to perform unrestricted ADLs/IADLs at home & community. (Initial LTG)              RC: 26 points (52%)      3. Pt will report compliance with home HEP at end of care to enhance carry over of fuctional gains made with skilled therapy services in order to improve quality of life. (Initial LTG)              RC, ongoing; poor compliance with initial HEP; pt ed on sticking to a schedule at home for max therapy benefits (08/25/23)     Reports compliance [Date assessed: 09/08/2023]    4. Pt will improve  her 5 time sit to stand time to least 45 seconds to demonstrate improvement in her functional LE strength & decrease her risk for falls at home.               RC: 1 min 14 sec with BUE, with 2WW upon standing (09/01/23)      5. Pt will improve bilateral c/s rotation AROM to at least 45 degrees to improve their ability to perform head turns before cross street. (Updated goal)               RC: 34 deg B (09/01/23)     Next PN/ RC due 09/30/2023  Auth due (visit number/ date) 5 visits remaining through 11/02/2023    PLAN  - Continue Plan of Care    Jennette Dubin, PTA, CSCS    09/08/2023    12:08 PM  If an interpreting service was utilized for treatment of this patient, the contents of this document represent the material reviewed with the patient via the interpreter.     Future Appointments   Date Time Provider Department Center   09/08/2023 12:10 PM Jennette Dubin, PT [facility]   09/15/2023 12:10 PM Gerarda Gunther [facility]   09/22/2023 12:10 PM Dorise Bullion [facility]

## 2023-09-15 ENCOUNTER — Encounter

## 2023-09-15 ENCOUNTER — Encounter: Payer: MEDICARE | Primary: Physician Assistant

## 2023-09-18 ENCOUNTER — Inpatient Hospital Stay: Admit: 2023-09-18 | Payer: MEDICARE | Primary: Physician Assistant

## 2023-09-18 NOTE — Progress Notes (Signed)
PHYSICAL / OCCUPATIONAL THERAPY - DAILY TREATMENT NOTE    Patient Name: [name] Short    Date: 09/18/2023    DOB: 1955/12/18  Insurance: Payor: BCBS MEDICARE / Plan: ANTHEM FULL DUAL ADVANTAGE 2 / Product Type: *No Product type* /      Patient DOB verified Yes     Visit #   Current / Total 2 10   Time   In / Out 1216 1256   Pain   In / Out 6/10 6/10   Subjective Functional Status/Changes: "No changes"      TREATMENT AREA =  Neck pain [M54.2]  Other low back pain [M54.59]     OBJECTIVE    Therapeutic Procedures:    Tx Min Billable or 1:1 Min (if diff from Tx Min) Procedure, Rationale, Specifics   14 14 97110 Therapeutic Exercise (timed):  increase ROM, strength, coordination, balance, and proprioception to improve patient's ability to progress to PLOF and address remaining functional goals. (see flow sheet as applicable)     Details if applicable:       14 14 97112 Neuromuscular Re-Education (timed):  improve balance, coordination, kinesthetic sense, posture, core stability and proprioception to improve patient's ability to develop conscious control of individual muscles and awareness of position of extremities in order to progress to PLOF and address remaining functional goals. (see flow sheet as applicable)     Details if applicable:  scap/ c/s re-ed, hip stability    12 12 97530 Therapeutic Activity (timed):  use of dynamic activities replicating functional movements to increase ROM, strength, coordination, balance, and proprioception in order to improve patient's ability to progress to PLOF and address remaining functional goals.  (see flow sheet as applicable)     Details if applicable:  standing functional BLE strength, STS   40 40 MC BC Totals Reminder: bill using total billable min of TIMED therapeutic procedures (example: do not include dry needle or estim unattended, both untimed codes, in totals to left)  8-22 min = 1 unit; 23-37 min = 2 units; 38-52 min = 3 units; 53-67 min = 4 units; 68-82 min = 5 units    Total Total     [x]   Patient Education billed concurrently with other procedures   [x]  Review HEP    []  Progressed/Changed HEP, detail:    []  Other detail:       Objective Information/Functional Measures/Assessment    Patient participated in today's session without adverse effect; progressing appropriately. Good carryover of familiar exercises. Able to resume 8" step tap with good hip flex control. Unable to perform STS from chair without UE; performed from elevated plinth with cuing for hip/trunk ext upon standing. Progress as able.     Patient will continue to benefit from skilled PT / OT services to modify and progress therapeutic interventions, analyze and address functional mobility deficits, analyze and address ROM deficits, analyze and address strength deficits, analyze and address soft tissue restrictions, analyze and cue for proper movement patterns, analyze and modify for postural abnormalities, analyze and address imbalance/dizziness, and instruct in home and community integration to address functional deficits and attain remaining goals.    Progress toward goals / Updated goals:  []   See Progress Note/Recertification    Pt will be able to improve strength in bilateral shoulder flexion, elbow flexion/extension, ER to at least 5/5 to improve patient's ability to lift objects into cabinet at home & community. (Initial LTG)  RC: shoulder flex R 4/5, L 4+/5; Elbow flex R 4+/5, L 5/5; Elbow ext 5/5 B (09/01/23)      2. Pt will improve NDI (functional outcome name) score to 22 to demonstrate improvement in patient's ability to perform unrestricted ADLs/IADLs at home & community. (Initial LTG)              RC: 26 points (52%)      3. Pt will report compliance with home HEP at end of care to enhance carry over of fuctional gains made with skilled therapy services in order to improve quality of life. (Initial LTG)              RC, ongoing; poor compliance with initial HEP; pt ed on sticking to a  schedule at home for max therapy benefits (08/25/23)                Reports compliance [Date assessed: 09/08/2023]     4. Pt will improve her 5 time sit to stand time to least 45 seconds to demonstrate improvement in her functional LE strength & decrease her risk for falls at home.               RC: 1 min 14 sec with BUE, with 2WW upon standing (09/01/23)      Remains challenged with STS from elevated plinth without UE (09/18/23)     5. Pt will improve bilateral c/s rotation AROM to at least 45 degrees to improve their ability to perform head turns before cross street. (Updated goal)               RC: 34 deg B (09/01/23)     Next PN/ RC due 09/30/23  Auth due (visit number/ date) 4 visits remaining through 11/02/23    PLAN  - Continue Plan of Care    Marlyne Beards, PT    09/18/2023    12:22 PM  If an interpreting service was utilized for treatment of this patient, the contents of this document represent the material reviewed with the patient via the interpreter.     Future Appointments   Date Time Provider Department Center   09/22/2023 12:10 PM Dorise Bullion [facility]   09/29/2023 12:10 PM Jennette Dubin, PT [facility]

## 2023-09-21 ENCOUNTER — Ambulatory Visit: Payer: Medicare Other | Admitting: Student

## 2023-09-21 VITALS — BP 129/59 | HR 70 | Ht 63.0 in | Wt 215.0 lb

## 2023-09-21 DIAGNOSIS — E1142 Type 2 diabetes mellitus with diabetic polyneuropathy: Secondary | ICD-10-CM | POA: Diagnosis not present

## 2023-09-21 DIAGNOSIS — Z7984 Long term (current) use of oral hypoglycemic drugs: Secondary | ICD-10-CM

## 2023-09-21 DIAGNOSIS — F17210 Nicotine dependence, cigarettes, uncomplicated: Secondary | ICD-10-CM

## 2023-09-21 DIAGNOSIS — I5042 Chronic combined systolic (congestive) and diastolic (congestive) heart failure: Secondary | ICD-10-CM | POA: Diagnosis not present

## 2023-09-21 DIAGNOSIS — R7989 Other specified abnormal findings of blood chemistry: Secondary | ICD-10-CM

## 2023-09-21 DIAGNOSIS — R32 Unspecified urinary incontinence: Secondary | ICD-10-CM | POA: Diagnosis not present

## 2023-09-21 DIAGNOSIS — Z720 Tobacco use: Secondary | ICD-10-CM

## 2023-09-21 DIAGNOSIS — R159 Full incontinence of feces: Secondary | ICD-10-CM

## 2023-09-21 DIAGNOSIS — Z0000 Encounter for general adult medical examination without abnormal findings: Secondary | ICD-10-CM

## 2023-09-21 DIAGNOSIS — E782 Mixed hyperlipidemia: Secondary | ICD-10-CM

## 2023-09-21 LAB — BASIC METABOLIC PANEL
Anion gap: 13 (ref 5–15)
BUN: 25 mg/dL — ABNORMAL HIGH (ref 8–23)
CO2: 22 mmol/L (ref 22–32)
Calcium: 9.1 mg/dL (ref 8.9–10.3)
Chloride: 103 mmol/L (ref 98–111)
Creatinine, Ser: 1.08 mg/dL — ABNORMAL HIGH (ref 0.44–1.00)
GFR, Estimated: 57 mL/min — ABNORMAL LOW (ref >60)
Glucose, Bld: 110 mg/dL — ABNORMAL HIGH (ref 70–99)
Potassium: 4.9 mmol/L (ref 3.5–5.1)
Sodium: 138 mmol/L (ref 135–145)

## 2023-09-21 LAB — POCT GLYCOSYLATED HEMOGLOBIN (HGB A1C): Hemoglobin A1C: 6.2 % — AB (ref 4.0–5.6)

## 2023-09-21 LAB — GLUCOSE, CAPILLARY: Glucose-Capillary: 115 mg/dL — ABNORMAL HIGH (ref 70–99)

## 2023-09-21 MED ORDER — ENALAPRIL MALEATE 5 MG PO TABS: 5.0000 mg | ORAL_TABLET | Freq: Two times a day (BID) | ORAL | 3 refills | Status: AC

## 2023-09-21 MED ORDER — ASPIRIN 81 MG PO TBEC
81.0000 mg | DELAYED_RELEASE_TABLET | Freq: Every day | ORAL | 3 refills | Status: AC
Start: 2023-09-21 — End: ?

## 2023-09-21 MED ORDER — METOPROLOL SUCCINATE ER 50 MG PO TB24: ORAL_TABLET | ORAL | 3 refills | Status: AC

## 2023-09-21 MED ORDER — ATORVASTATIN CALCIUM 40 MG PO TABS: 40.0000 mg | ORAL_TABLET | Freq: Every day | ORAL | 3 refills | Status: AC

## 2023-09-21 NOTE — Assessment & Plan Note (Signed)
A2c is 6.2 today. Has been taking Synjardy 5-500mg  tablets BID. Denies any vaginal discharge, itch. Tolerating it well. No GI upset. Due to urine microalbumin ratio and BMP today.   -Cw Synjardy 5-500mg  tablets BID

## 2023-09-21 NOTE — Patient Instructions (Signed)
Thank you, Ms.Torra Pala for allowing [place] to provide your care today.   I have ordered the following labs for you:   Lab Orders         Microalbumin / Creatinine Urine Ratio         Glucose, capillary         BMP8+Anion Gap         POC Hbg A1C      Referrals ordered today:    Referral Orders         Ambulatory Referral for DME      I have ordered the following medication/changed the following medications:   Meds ordered this encounter  Medications   metoprolol succinate (TOPROL-XL) 50 MG 24 hr tablet    Sig: TAKE 1 TABLET DAILY WITH OR IMMEDIATELY FOLLOWING A MEAL.    Dispense:  90 tablet    Refill:  3   enalapril (VASOTEC) 5 MG tablet    Sig: Take 1 tablet (5 mg total) by mouth 2 (two) times daily.    Dispense:  180 tablet    Refill:  3   atorvastatin (LIPITOR) 40 MG tablet    Sig: Take 1 tablet (40 mg total) by mouth daily.    Dispense:  90 tablet    Refill:  3   aspirin EC 81 MG tablet    Sig: Take 1 tablet (81 mg total) by mouth daily.    Dispense:  90 tablet    Refill:  3     Follow up: 6 months    Should you have any questions or concerns please call the internal medicine clinic at [phone].     Manuela Neptune, MD [facility]

## 2023-09-21 NOTE — Progress Notes (Signed)
CC:  Chief Complaint  Patient presents with   Medical Management of Chronic Issues   Urinary Incontinence    Requests order for supplies    HPI:  [name] is a 68 y.o. female living with a history stated below and presents today for check up on her diabetes. Please see problem based assessment and plan for additional details.  Past Medical History:  Diagnosis Date   Cervical disc disease with myelopathy    CHF (congestive heart failure) (HCC)    Chronic headaches    DJD (degenerative joint disease)    DM (diabetes mellitus) (HCC) 08/04/2006   stable HgBA1C at 6.5   GERD (gastroesophageal reflux disease)    well controlled on Omeprazole   HLD (hyperlipidemia) 08/04/2006   stable, well controlled   Hypertension    well controlled    Current Outpatient Medications on File Prior to Visit  Medication Sig Dispense Refill   ammonium lactate (AMLACTIN) 12 % lotion Apply 1 application topically 2 (two) times daily. Apply to feet once daily. 400 g 5   buPROPion (WELLBUTRIN SR) 150 MG 12 hr tablet Take 1 tablet (150 mg total) by mouth 2 (two) times daily. 180 tablet 2   clotrimazole-betamethasone (LOTRISONE) cream Apply to both feet twice daily for 6 weeks. 45 g 1   Empagliflozin-metFORMIN HCl (SYNJARDY) 5-500 MG TABS Take 1 tablet by mouth 2 (two) times daily. 90 tablet 3   furosemide (LASIX) 40 MG tablet Take 1 tablet (40 mg total) by mouth 2 (two) times daily. 90 tablet 3   potassium chloride SA (KLOR-CON M) 20 MEQ tablet Take 1 tablet (20 mEq total) by mouth daily. 90 tablet 3   spironolactone (ALDACTONE) 25 MG tablet Take 1 tablet (25 mg total) by mouth daily. 90 tablet 3   No current facility-administered medications on file prior to visit.    Family History  Problem Relation Age of Onset   Diabetes Mother    Heart disease Mother    Prostate cancer Father    Neuropathy Neg Hx     Social History   Socioeconomic History   Marital status: Single    Spouse name:  Not on file   Number of children: 3   Years of education: 43   Highest education level: Not on file  Occupational History   Occupation: Unemployed  Tobacco Use   Smoking status: Some Days    Current packs/day: 0.00    Types: Cigarettes    Last attempt to quit: 04/18/2013    Years since quitting: 10.4   Smokeless tobacco: Current   Tobacco comments:    1-2 cigs in the a.m.  Vaping Use   Vaping status: Never Used  Substance and Sexual Activity   Alcohol use: No    Alcohol/week: 0.0 standard drinks of alcohol   Drug use: No   Sexual activity: Not Currently  Other Topics Concern   Not on file  Social History Narrative   to get supplies nfrom [facility] per pt request.1--[phone]   Lives at home with son.    Right handed.   Caffeine use: drinks coffee-rare   Social Drivers of Corporate investment banker Strain: Low Risk  (04/28/2023)   Overall Financial Resource Strain (CARDIA)    Difficulty of Paying Living Expenses: Not hard at all  Food Insecurity: No Food Insecurity (04/28/2023)   Hunger Vital Sign    Worried About Running Out of Food in the Last Year: Never true  Ran Out of Food in the Last Year: Never true  Transportation Needs: No Transportation Needs (04/28/2023)   PRAPARE - Administrator, Civil Service (Medical): No    Lack of Transportation (Non-Medical): No  Physical Activity: Insufficiently Active (04/28/2023)   Exercise Vital Sign    Days of Exercise per Week: 7 days    Minutes of Exercise per Session: 10 min  Stress: No Stress Concern Present (04/28/2023)   [employer] of Occupational Health - Occupational Stress Questionnaire    Feeling of Stress : Not at all  Social Connections: Socially Isolated (04/28/2023)   Social Connection and Isolation Panel [NHANES]    Frequency of Communication with Friends and Family: More than three times a week    Frequency of Social Gatherings with Friends and Family: More than three times a week    Attends  Religious Services: Never    Database administrator or Organizations: No    Attends Banker Meetings: Never    Marital Status: Never married  Intimate Partner Violence: Not At Risk (04/28/2023)   Humiliation, Afraid, Rape, and Kick questionnaire    Fear of Current or Ex-Partner: No    Emotionally Abused: No    Physically Abused: No    Sexually Abused: No    Review of Systems: ROS negative except for what is noted on the assessment and plan.  Vitals:   09/21/23 1556  BP: (!) 129/59  Pulse: 70  SpO2: 100%  Weight: 215 lb (97.5 kg)  Height: 5\' 3"  (1.6 m)    Physical Exam: Constitutional: well-appearing, in NAD HENT: normocephalic atraumatic, mucous membranes moist Eyes: conjunctiva non-erythematous Cardiovascular: regular rate and rhythm, no m/r/g, 1+ pitting edema on the BLE Pulmonary/Chest: normal work of breathing on room air, lungs clear to auscultation bilaterally Abdominal: soft, non-tender, non-distended MSK: normal bulk and tone Neurological: alert & oriented x 3 Skin: warm and dry Psych: normal mood and behavior  Assessment & Plan:   Patient discussed with Dr. 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 Has been taking Synjardy 5-500mg  tablets BID. Denies any vaginal discharge, itch. Tolerating it well. No GI upset. Due to urine microalbumin ratio and BMP today.   -Cw Synjardy 5-500mg  tablets BID  Combined systolic and diastolic congestive heart failure (HCC) Is euvolemic today on exam. Has some mild pitting edema. On metoprolol 50mg  every day, Spironolactone 25mg  daily, Enalapril 5mg  daily and empagliflozin. Echo 4 years ago showed decreased EF with 40-45%. Refills sent for metoprolol, spironolactone and enalapril.   Health care maintenance Patient is overdue for many health maintenance items. Discussed need for pap smear, mammogram, colonoscopy, dexa  scan. Declined.  Declined vaccination for influenza and PCV. "She doesn't get sick like that". She understands the risk sand the benefits of vaccination and declines to have them today. Discussed that we could do a FIT test for colon cancer screening, considering but declined for today.  Manuela Neptune, MD [facility] Internal Medicine, PGY-1 Phone: [phone] Date 09/21/2023 Time 4:36 PM

## 2023-09-21 NOTE — Assessment & Plan Note (Addendum)
Patient is overdue for many health maintenance items. Discussed need for pap smear, mammogram, colonoscopy, dexa scan. Declined.  Declined vaccination for influenza and PCV. "She doesn't get sick like that". She understands the risk sand the benefits of vaccination and declines to have them today. Discussed that we could do a FIT test for colon cancer screening, considering but declined for today.

## 2023-09-21 NOTE — Addendum Note (Signed)
Addended by: Bufford Spikes on: 09/21/2023 05:41 PM   Modules accepted: Orders

## 2023-09-21 NOTE — Assessment & Plan Note (Addendum)
Is euvolemic today on exam. Has some mild pitting edema. On metoprolol 50mg  every day, Spironolactone 25mg  daily, Enalapril 5mg  daily and empagliflozin. Echo 4 years ago showed decreased EF with 40-45%. Refills sent for metoprolol, spironolactone and enalapril.

## 2023-09-22 ENCOUNTER — Ambulatory Visit (INDEPENDENT_AMBULATORY_CARE_PROVIDER_SITE_OTHER): Payer: Medicare Other | Admitting: Podiatry

## 2023-09-22 ENCOUNTER — Encounter: Payer: Self-pay | Admitting: Podiatry

## 2023-09-22 ENCOUNTER — Inpatient Hospital Stay: Admit: 2023-09-22 | Payer: MEDICARE | Primary: Physician Assistant

## 2023-09-22 DIAGNOSIS — M79675 Pain in left toe(s): Secondary | ICD-10-CM

## 2023-09-22 DIAGNOSIS — B351 Tinea unguium: Secondary | ICD-10-CM | POA: Diagnosis not present

## 2023-09-22 DIAGNOSIS — M79674 Pain in right toe(s): Secondary | ICD-10-CM | POA: Diagnosis not present

## 2023-09-22 DIAGNOSIS — E1142 Type 2 diabetes mellitus with diabetic polyneuropathy: Secondary | ICD-10-CM

## 2023-09-22 LAB — MICROALBUMIN / CREATININE URINE RATIO
Creatinine, Urine: 37.3 mg/dL
Microalb/Creat Ratio: <8 mg/g{creat} (ref 0–29)
Microalbumin, Urine: <3 ug/mL

## 2023-09-22 NOTE — Progress Notes (Signed)
PHYSICAL / OCCUPATIONAL THERAPY - DAILY TREATMENT NOTE    Patient Name: [name] Short    Date: 09/22/2023    DOB: 13-Jul-1956  Insurance: Payor: BCBS MEDICARE / Plan: ANTHEM FULL DUAL ADVANTAGE 2 / Product Type: *No Product type* /      Patient DOB verified Yes     Visit #   Current / Total 3 10   Time   In / Out 1210 1250   Pain   In / Out 6 5   Subjective Functional Status/Changes: Pt reports feeling better overall     TREATMENT AREA =  Neck pain [M54.2]  Other low back pain [M54.59]     OBJECTIVE         Therapeutic Procedures:    Tx Min Billable or 1:1 Min (if diff from Tx Min) Procedure, Rationale, Specifics   15 15 97110 Therapeutic Exercise (timed):  increase ROM, strength, coordination, balance, and proprioception to improve patient's ability to progress to PLOF and address remaining functional goals. (see flow sheet as applicable)     Details if applicable:       15 15 97112 Neuromuscular Re-Education (timed):  improve balance, coordination, kinesthetic sense, posture, core stability and proprioception to improve patient's ability to develop conscious control of individual muscles and awareness of position of extremities in order to progress to PLOF and address remaining functional goals. (see flow sheet as applicable)     Details if applicable:     10 10 97530 Therapeutic Activity (timed):  use of dynamic activities replicating functional movements to increase ROM, strength, coordination, balance, and proprioception in order to improve patient's ability to progress to PLOF and address remaining functional goals.  (see flow sheet as applicable)     Details if applicable:            Details if applicable:            Details if applicable:     40 40 MC BC Totals Reminder: bill using total billable min of TIMED therapeutic procedures (example: do not include dry needle or estim unattended, both untimed codes, in totals to left)  8-22 min = 1 unit; 23-37 min = 2 units; 38-52 min = 3 units; 53-67 min = 4 units; 68-82  min = 5 units   Total Total     [x]   Patient Education billed concurrently with other procedures   [x]  Review HEP    []  Progressed/Changed HEP, detail:    []  Other detail:       Objective Information/Functional Measures/Assessment    Pt Participated in today's session with minimal difficulty today.Pt exhibits increased ability to perform all standing activities in parallel bars with good strength and endurance at this time.Increased ability to transfer and ambulate with FWW noted today.Increase stride length and cadence noted during same. Reviewed HEP with Pt and see was able to perform demo.Progress with exercises program as tolerated.     Patient will continue to benefit from skilled PT / OT services to modify and progress therapeutic interventions, analyze and address functional mobility deficits, analyze and address ROM deficits, analyze and address strength deficits, analyze and address soft tissue restrictions, analyze and cue for proper movement patterns, analyze and modify for postural abnormalities, analyze and address imbalance/dizziness, and instruct in home and community integration to address functional deficits and attain remaining goals.    Progress toward goals / Updated goals:  []   See Progress Note/Recertification  Pt will be able to improve strength in bilateral  shoulder flexion, elbow flexion/extension, ER to at least 5/5 to improve patient's ability to lift objects into cabinet at home & community. (Initial LTG)              RC: shoulder flex R 4/5, L 4+/5; Elbow flex R 4+/5, L 5/5; Elbow ext 5/5 B (09/01/23)      2. Pt will improve NDI (functional outcome name) score to 22 to demonstrate improvement in patient's ability to perform unrestricted ADLs/IADLs at home & community. (Initial LTG)              RC: 26 points (52%)      3. Pt will report compliance with home HEP at end of care to enhance carry over of fuctional gains made with skilled therapy services in order to improve quality of life.  (Initial LTG)              RC, ongoing; poor compliance with initial HEP; pt ed on sticking to a schedule at home for max therapy benefits (08/25/23)                Reports compliance [Date assessed: 09/08/2023]     4. Pt will improve her 5 time sit to stand time to least 45 seconds to demonstrate improvement in her functional LE strength & decrease her risk for falls at home.               RC: 1 min 14 sec with BUE, with 2WW upon standing (09/01/23)                 Remains challenged with STS from elevated plinth without UE (09/18/23)      5. Pt will improve bilateral c/s rotation AROM to at least 45 degrees to improve their ability to perform head turns before cross street. (Updated goal)               RC: 34 deg B (09/01/23)     Next PN/ RC due 09/30/23  Auth due (visit number/ date) 3 visits remaining through 11/02/23    PLAN  - Continue Plan of Care    Larey Brick, PTA    09/22/2023    12:16 PM  If an interpreting service was utilized for treatment of this patient, the contents of this document represent the material reviewed with the patient via the interpreter.     Future Appointments   Date Time Provider Department Center   09/29/2023 12:10 PM Jennette Dubin, PT [facility]   09/29/2023  2:00 PM [facility] RM 3 3D [facility]   09/29/2023  3:00 PM [facility]ULAR LAB 1 [facility]

## 2023-09-22 NOTE — Progress Notes (Signed)
Spoke to patient about increase in Cr. She has been taking her lasix, denies worsening BLE edema, no orthopnea, no SOB. Lungs were clear to auscultation on exam, had 1+ pitting edema. She will continue taking her lasix and come in for a recheck on her BMP in two weeks.

## 2023-09-22 NOTE — Addendum Note (Signed)
Addended by: Manuela Neptune on: 09/22/2023 01:30 PM   Modules accepted: Orders

## 2023-09-24 NOTE — Progress Notes (Deleted)
 [facility] Clinic Attending  Case discussed with the resident at the time of the visit.  We reviewed the resident's history and exam and pertinent patient test results.  I agree with the assessment, diagnosis, and plan of care documented in the resident's note.

## 2023-09-24 NOTE — Progress Notes (Signed)
 [facility] Clinic Attending  Case discussed with the resident at the time of the visit.  We reviewed the resident's history and exam and pertinent patient test results.  I agree with the assessment, diagnosis, and plan of care documented in the resident's note.

## 2023-09-29 ENCOUNTER — Inpatient Hospital Stay: Payer: MEDICARE | Primary: Physician Assistant

## 2023-09-29 ENCOUNTER — Encounter

## 2023-09-29 ENCOUNTER — Inpatient Hospital Stay: Admit: 2023-09-29 | Payer: MEDICARE | Primary: Physician Assistant

## 2023-09-29 ENCOUNTER — Encounter: Payer: MEDICARE | Primary: Physician Assistant

## 2023-09-29 DIAGNOSIS — Z1231 Encounter for screening mammogram for malignant neoplasm of breast: Secondary | ICD-10-CM

## 2023-09-30 ENCOUNTER — Inpatient Hospital Stay: Admit: 2023-09-30 | Discharge: 2023-10-08 | Payer: MEDICARE | Primary: Physician Assistant

## 2023-09-30 DIAGNOSIS — M79661 Pain in right lower leg: Secondary | ICD-10-CM

## 2023-09-30 DIAGNOSIS — M7989 Other specified soft tissue disorders: Secondary | ICD-10-CM

## 2023-09-30 NOTE — Progress Notes (Signed)
  Subjective:  Patient ID: Tekela Garguilo, female    DOB: 02/24/1956,  MRN: [id number]  68 y.o. female presents to clinic with  at risk foot care with history of diabetic neuropathy and painful, elongated thickened toenails x 10 which are symptomatic when wearing enclosed shoe gear. This interferes with his/her daily activities. She is accompanied by her son, Greig Castilla, on today's visit. Chief Complaint  Patient presents with   [facility]    RM#[facility] patient doesn't have to monitor bs at home per patient.     New problem(s): None   PCP is Hassan Rowan, [place], [place].  Allergies  Allergen Reactions   Loratadine-Pseudoephedrine Er Hives    Claritin-D    Review of Systems: Negative except as noted in the HPI.   Objective:  Shilo Philipson is a pleasant 68 y.o. female obese in NAD. AAO x 3.  Vascular Examination: Palpable pedal pulses. CFT immediate b/l. No pain with calf compression b/l. Skin temperature gradient WNL b/l. Nonpitting edema noted BLE.  Neurological Examination: Sensation grossly intact b/l with 10 gram monofilament. Vibratory sensation intact b/l. Pt has subjective symptoms of neuropathy.  Dermatological Examination: Pedal skin with normal turgor, texture and tone b/l. Toenails 1-5 b/l thick, discolored, elongated with subungual debris and pain on dorsal palpation. No hyperkeratotic lesions noted b/l. Multiple nevi noted b/l LE, medial aspect right foot and medial aspect right 5th digit.   Musculoskeletal Examination: Muscle strength 5/5 to b/l LE. No pain, crepitus or joint limitation noted with ROM bilateral LE. Pes planus deformity noted bilateral LE.  Radiographs: None  Last A1c:      Latest Ref Rng & Units 09/21/2023    4:01 PM  Hemoglobin A1C  Hemoglobin-A1c 4.0 - 5.6 % 6.2      Assessment:   1. Pain due to onychomycosis of toenails of both feet   2. Diabetic peripheral neuropathy associated with type 2 diabetes mellitus (HCC)    Plan:  Patient was evaluated and  treated. All patient's and/or POA's questions/concerns addressed on today's visit. Mycotic toenails 1-5 debrided in length and girth without incident. Continue soft, supportive shoe gear daily. Report any pedal injuries to medical professional. Call office if there are any quesitons/concerns. -Patient/POA to call should there be question/concern in the interim.  Return in about 3 months (around 12/20/2023).  Freddie Breech, DPM      [facility] LOCATION: [address], [place] [address]                   Office [phone]   [facility] LOCATION: [address] [place], [place] [address] Office [phone]

## 2023-10-01 ENCOUNTER — Inpatient Hospital Stay: Admit: 2023-10-01 | Payer: MEDICARE | Primary: Physician Assistant

## 2023-10-01 NOTE — Progress Notes (Signed)
 [facility] - [address]  [address]  [place], [place] [address]  [phone] fax    CONTINUED PLAN OF CARE/RECERTIFICATION FOR PHYSICAL THERAPY          Patient Name: [name] Short DOB: 04-Jul-1956   Treatment/Medical Diagnosis: Neck pain [M54.2]  Other low back pain [M54.59]   Onset Date: Ref date: 05/07/23     Referral Source: Elmer Ramp, MD Start of Care [facility]): 9   Prior Hospitalization: See Medical History Provider #: [id number]   Prior Level of Function: Assistance from care giver for most self care    Comorbidities: Hx of CVA (2023), DM, Arthritis, HTN, C/C fusion, Hx of sacral surgery for wound.    Visits from [facility]: 9 Missed Visits: 0     Progress to Goals:    Pt will be able to improve strength in bilateral shoulder flexion, elbow flexion/extension, ER to at least 5/5 to improve patient's ability to lift objects into cabinet at home & community. (Initial LTG)              RC: shoulder flex R 4/5, L 4+/5; Elbow flex R 4+/5, L 5/5; Elbow ext 5/5 B (09/01/23)               RC: Shoulder flexion: right 4/5, left 4+/5; Elbow Right 4+/5, left 5/5, Tricep = 5/5     2. Pt will improve NDI (functional outcome name) score to 22 to demonstrate improvement in patient's ability to perform unrestricted ADLs/IADLs at home & community. (Initial LTG)              RC: 26 points (52%)               RC: 29 points      3. Pt will report compliance with home HEP at end of care to enhance carry over of fuctional gains made with skilled therapy services in order to improve quality of life. (Initial LTG)              RC, ongoing; poor compliance with initial HEP; pt ed on sticking to a schedule at home for max therapy benefits (08/25/23)                RC: progressing., Compliance with 1 exercise a day - encouraged to complete all of her HEP everyday.       4. Pt will improve her 5 time sit to stand time to least 45 seconds to demonstrate improvement in her functional LE strength & decrease her  risk for falls at home.               RC: 1 min 14 sec with BUE, with 2WW upon standing (09/01/23)               RC: Progressing, 53 seconds 2WW      5. Pt will improve bilateral c/s rotation AROM to at least 45 degrees to improve their ability to perform head turns before cross street. (Updated goal)               RC: 34 deg B (09/01/23)               RC: progressed Right: 40 degrees, Left: 20 degrees    Key Functional Changes/Progress:     Functional Gains: Reports that turning my neck has gotten better. Able to use UE more since starting therapy.   Functional Deficits: States that standing up from chair  has gotten worse due to pain radiating both legs (when exerting LE to stand). Despite more movement into her arms she still lacks end-range mobility & weakness continues to persist.   % improvement: 35%  Pain   Average: 6/10                  Best: 5/10                Worst: 7/10  Patient Goal: "To be able to hold my head up & turn around & no pain in my back/knees."    Problem List: pain affecting function, decrease ROM, decrease strength, edema affection function, impaired gait/balance, decrease ADL/functional abilities, decrease activity tolerance, decrease flexibility/joint mobility, and decrease transfer abilities    Treatment Plan may include any combination of the following: 16109 Therapeutic Exercise, 97112 Neuromuscular Re-Education, 97140 Manual Therapy, 97530 Therapeutic Activity, 97535 Self Care/Home Management, 97014 Electrical Stim unattended / [phone] [facility]), [phone] Electrical Stim attended, and [phone] Gait Training  Patient Goal(s) has been updated and includes: "To be able to hold my head up & turn around & no pain in my back/knees."    Goals for this certification period include and are to be achieved in   5  weeks:    Pt will be able to improve strength in bilateral shoulder flexion, elbow flexion/extension, ER to at least 5/5 to improve patient's ability to lift objects into cabinet at home &  community. (Initial LTG)              RC: no change, Shoulder flexion: right 4/5, left 4+/5; Elbow Right 4+/5, left 5/5, Tricep = 5/5     2. Pt will improve NDI (functional outcome name) score to 22 to demonstrate improvement in patient's ability to perform unrestricted ADLs/IADLs at home & community. (Initial LTG)              RC: regressed, 29 points      3. Pt will report compliance with home HEP at end of care to enhance carry over of fuctional gains made with skilled therapy services in order to improve quality of life. (Initial LTG)              RC, ongoing; poor compliance with initial HEP; pt ed on sticking to a schedule at home for max therapy benefits (08/25/23)                RC: progressing. Compliance with 1 exercise a day - encouraged to complete all of her HEP everyday.       4. Pt will improve her 5 time sit to stand time to least 45 seconds to demonstrate improvement in her functional LE strength & decrease her risk for falls at home.                          RC: Progressing, 53 seconds 2WW      5. Pt will improve bilateral c/s rotation AROM to at least 45 degrees to improve their ability to perform head turns before cross street. (Updated goal)              RC: progressed Right: 40 degrees, Left: 20 degrees    Frequency / Duration:   Patient to be seen   1-2   times per week for   5    weeks:  Frequency / Duration: Patient would benefit from skilled PT 1-2 times per week for 24 VISITS sessions as  needed in this certification period.  Goals will be assigned and reassessed every 10 visits/ 30 days per Medicare guidelines    Assessments/Recommendations:     Pt self-reports 35% improvement since beginning PT with slight but slow progress. Pt reports functional gains that include: Reports that turning my neck has gotten better. Able to use UE more since starting therapy. Pt reports functional deficits that include: States that standing up from chair has gotten worse due to pain radiating both legs (when  exerting LE to stand). Despite more movement into her arms she still lacks end-range mobility & weakness continues to persist. Pt reports a recent average pain of 6/10, 7/10 at worst, and 5/10 at best. Objectively, she demonstrates progressing 5TSTS as she was able to decrease her time during today's reassessment compared to last PN however continues to be a falls risk; no change with shoulder strength at this time but all no regression suggesting good maintenance regarding her strength. She demonstrated improving C/S rotation to the right compared to last RC however regression noted to the left. May add in core stabilization exercises to improve her lower back pain. Pt would benefit from continued skilled PT to address remaining functional deficits. We will continue with PT at 1-2x/wk for 5 weeks.     If you have any questions/comments please contact us directly at [phone].   Thank you for allowing [place] to assist in the care of your patient.    Certification Period: 10/01/23 - 10/30/23  Reporting Period (date from last assessment to current assessment): 09/01/23 - 10/01/23    Irwin Brakeman, PT       10/01/2023       5:51 PM      ___ I have read the above report and request that my patient continue as recommended.   ___ I have read the above report and request that my patient continue therapy with the following changes/special instructions: ________________________________________________   ___ I have read the above report and request that my patient be discharged from therapy.     Physician's Signature:_________________________   DATE:_________   TIME:________                           Elmer Ramp, MD    ** Signature, Date and Time must be completed for valid certification **  Please sign and return to [facility] or you may fax the signed copy to [phone]  Thank you.

## 2023-10-01 NOTE — Progress Notes (Addendum)
 PHYSICAL / OCCUPATIONAL THERAPY - DAILY TREATMENT NOTE    Patient Name: [name] Short    Date: 10/01/2023    DOB: [birth year]  Insurance: Payor: BCBS MEDICARE / Plan: ANTHEM FULL DUAL ADVANTAGE 2 / Product Type: *No Product type* /      Patient DOB verified Yes     Visit #   Current / Total 4 10   Time   In / Out 452 540   Pain   In / Out 6 5   Subjective Functional Status/Changes: States that she feels like therapy has been helping, feels like she can turn her head better.      TREATMENT AREA =  Neck pain [M54.2]  Other low back pain [M54.59]     OBJECTIVE         Therapeutic Procedures:    Tx Min Billable or 1:1 Min (if diff from Tx Min) Procedure, Rationale, Specifics   30 30 97530 Therapeutic Activity (timed):  use of dynamic activities replicating functional movements to increase ROM, strength, coordination, balance, and proprioception in order to improve patient's ability to progress to PLOF and address remaining functional goals.  (see flow sheet as applicable)     Details if applicable:  NDI, Reassesment, Goals     8 8 97535 Self Care/Home Management (timed):  improve patient knowledge and understanding of home injury/symptom/pain management  to improve patient's ability to progress to PLOF and address remaining functional goals.  (see flow sheet as applicable)     Details if applicable:  Patient education on compliance with HEP to ensure carry over.    38 38 MC BC Totals Reminder: bill using total billable min of TIMED therapeutic procedures (example: do not include dry needle or estim unattended, both untimed codes, in totals to left)  8-22 min = 1 unit; 23-37 min = 2 units; 38-52 min = 3 units; 53-67 min = 4 units; 68-82 min = 5 units   Total Total     [x]   Patient Education billed concurrently with other procedures   [x]  Review HEP    []  Progressed/Changed HEP, detail:    []  Other detail:       Objective Information/Functional Measures/Assessment    Pt self-reports 35% improvement since beginning PT with  slight but slow progress. Pt reports functional gains that include: Reports that turning my neck has gotten better. Able to use UE more since starting therapy. Pt reports functional deficits that include: States that standing up from chair has gotten worse due to pain radiating both legs (when exerting LE to stand). Despite more movement into her arms she still lacks end-range mobility & weakness continues to persist. Pt reports a recent average pain of 6/10, 7/10 at worst, and 5/10 at best. Objectively, she demonstrates progressing 5TSTS as she was able to decrease her time during today's reassessment compared to last PN however continues to be a falls risk; no change with shoulder strength at this time but all no regression suggesting good maintenance regarding her strength. She demonstrated improving C/S rotation to the right compared to last RC however regression noted to the left. May add in core stabilization exercises to improve her lower back pain. Pt would benefit from continued skilled PT to address remaining functional deficits. We will continue with PT at 1-2x/wk for 5 weeks.      Functional Gains: Reports that turning my neck has gotten better. Able to use UE more since starting therapy.   Functional Deficits: States that standing up  from chair has gotten worse due to pain radiating both legs (when exerting LE to stand). Despite more movement into her arms she still lacks end-range mobility & weakness continues to persist.   % improvement: 35%  Pain   Average: 6/10       Best: 5/10     Worst: 7/10  Patient Goal: "To be able to hold my head up & turn around & no pain in my back/knees."      Patient will continue to benefit from skilled PT / OT services to modify and progress therapeutic interventions, analyze and address functional mobility deficits, analyze and address ROM deficits, analyze and address strength deficits, analyze and address soft tissue restrictions, analyze and cue for proper movement  patterns, analyze and modify for postural abnormalities, analyze and address imbalance/dizziness, and instruct in home and community integration to address functional deficits and attain remaining goals.    Progress toward goals / Updated goals:  []   See Progress Note/Recertification    Pt will be able to improve strength in bilateral shoulder flexion, elbow flexion/extension, ER to at least 5/5 to improve patient's ability to lift objects into cabinet at home & community. (Initial LTG)              RC: shoulder flex R 4/5, L 4+/5; Elbow flex R 4+/5, L 5/5; Elbow ext 5/5 B (09/01/23)    RC: Shoulder flexion: right 4/5, left 4+/5; Elbow Right 4+/5, left 5/5, Tricep = 5/5     2. Pt will improve NDI (functional outcome name) score to 22 to demonstrate improvement in patient's ability to perform unrestricted ADLs/IADLs at home & community. (Initial LTG)              RC: 26 points (52%)    RC: 29 points      3. Pt will report compliance with home HEP at end of care to enhance carry over of fuctional gains made with skilled therapy services in order to improve quality of life. (Initial LTG)              RC, ongoing; poor compliance with initial HEP; pt ed on sticking to a schedule at home for max therapy benefits (08/25/23)                RC: progressing., Compliance with 1 exercise a day - encouraged to complete all of her HEP everyday.       4. Pt will improve her 5 time sit to stand time to least 45 seconds to demonstrate improvement in her functional LE strength & decrease her risk for falls at home.               RC: 1 min 14 sec with BUE, with 2WW upon standing (09/01/23)               RC: Progressing, 53 seconds 2WW     5. Pt will improve bilateral c/s rotation AROM to at least 45 degrees to improve their ability to perform head turns before cross street. (Updated goal)               RC: 34 deg B (09/01/23)    RC: progressed Right: 40 degrees, Left: 20 degrees    Next PN/ RC due 09/01/23 - 10/01/23  Auth due (visit  number/ date) 1 more til 11/01/23    PLAN  - Continue Plan of Care    [place], [place]    10/01/2023    4:56 PM  If an interpreting service was utilized for treatment of this patient, the contents of this document represent the material reviewed with the patient via the interpreter.     No future appointments.

## 2023-10-02 ENCOUNTER — Encounter: Payer: MEDICARE | Primary: Physician Assistant

## 2023-10-02 DIAGNOSIS — M79661 Pain in right lower leg: Secondary | ICD-10-CM

## 2023-10-06 ENCOUNTER — Inpatient Hospital Stay: Admit: 2023-10-06 | Payer: MEDICARE | Primary: Physician Assistant

## 2023-10-06 DIAGNOSIS — M542 Cervicalgia: Secondary | ICD-10-CM

## 2023-10-06 NOTE — Progress Notes (Signed)
 PHYSICAL / OCCUPATIONAL THERAPY - DAILY TREATMENT NOTE    Patient Name: [name] Short    Date: 10/06/2023    DOB: 1956-08-03  Insurance: Payor: BCBS MEDICARE / Plan: ANTHEM FULL DUAL ADVANTAGE 2 / Product Type: *No Product type* /      Patient DOB verified Yes     Visit #   Current / Total 1 10   Time   In / Out 1:29 2:08   Pain   In / Out 6 6   Subjective Functional Status/Changes: Pt notes she travelled over the weekend to go to her doctor's office in [place].     TREATMENT AREA =  Neck pain [M54.2]  Other low back pain [M54.59]     OBJECTIVE         Therapeutic Procedures:    Tx Min Billable or 1:1 Min (if diff from Tx Min) Procedure, Rationale, Specifics   8  97530 Therapeutic Activity (timed):  use of dynamic activities replicating functional movements to increase ROM, strength, coordination, balance, and proprioception in order to improve patient's ability to progress to PLOF and address remaining functional goals.  (see flow sheet as applicable)     Details if applicable:  NDI, Reassesment, Goals     18  97110 Therapeutic Exercise (timed):  increase ROM, strength, coordination, balance, and proprioception to improve patient's ability to progress to PLOF and address remaining functional goals. (see flow sheet as applicable)     Details if applicable:     13  97112 Neuromuscular Re-Education (timed):  improve balance, coordination, kinesthetic sense, posture, core stability and proprioception to improve patient's ability to develop conscious control of individual muscles and awareness of position of extremities in order to progress to PLOF and address remaining functional goals. (see flow sheet as applicable)     39  MC BC Totals Reminder: bill using total billable min of TIMED therapeutic procedures (example: do not include dry needle or estim unattended, both untimed codes, in totals to left)  8-22 min = 1 unit; 23-37 min = 2 units; 38-52 min = 3 units; 53-67 min = 4 units; 68-82 min = 5 units   Total Total      [x]   Patient Education billed concurrently with other procedures   [x]  Review HEP    []  Progressed/Changed HEP, detail:    []  Other detail:       Objective Information/Functional Measures/Assessment    Progressed scap squeezes to no money with RTB to improve scapular retraction strength for standing posture  Added 1/2 FR to seated t/s ext to increase ROM   Pt tolerated today's session well, progress NV  GOAL - 3. Pt will report compliance with home HEP at end of care to enhance carry over of fuctional gains made with skilled therapy services in order to improve quality of life  Status 3/4: Pt notes partial compliance, states she does "some one day and some another"    Patient will continue to benefit from skilled PT / OT services to modify and progress therapeutic interventions, analyze and address functional mobility deficits, analyze and address ROM deficits, analyze and address strength deficits, analyze and address soft tissue restrictions, analyze and cue for proper movement patterns, analyze and modify for postural abnormalities, analyze and address imbalance/dizziness, and instruct in home and community integration to address functional deficits and attain remaining goals.    Progress toward goals / Updated goals:  []   See Progress Note/Recertification    Pt will be able to  improve strength in bilateral shoulder flexion, elbow flexion/extension, ER to at least 5/5 to improve patient's ability to lift objects into cabinet at home & community. (Initial LTG)              RC: shoulder flex R 4/5, L 4+/5; Elbow flex R 4+/5, L 5/5; Elbow ext 5/5 B (09/01/23)    RC: Shoulder flexion: right 4/5, left 4+/5; Elbow Right 4+/5, left 5/5, Tricep = 5/5     2. Pt will improve NDI (functional outcome name) score to 22 to demonstrate improvement in patient's ability to perform unrestricted ADLs/IADLs at home & community. (Initial LTG)              RC: 26 points (52%)    RC: 29 points      3. Pt will report compliance with  home HEP at end of care to enhance carry over of fuctional gains made with skilled therapy services in order to improve quality of life. (Initial LTG)              RC, ongoing; poor compliance with initial HEP; pt ed on sticking to a schedule at home for max therapy benefits (08/25/23)                RC: progressing., Compliance with 1 exercise a day - encouraged to complete all of her HEP everyday.     Status 3/4:  Pt notes partial compliance, states she does "some one day and some another"    4. Pt will improve her 5 time sit to stand time to least 45 seconds to demonstrate improvement in her functional LE strength & decrease her risk for falls at home.               RC: 1 min 14 sec with BUE, with 2WW upon standing (09/01/23)               RC: Progressing, 53 seconds 2WW     5. Pt will improve bilateral c/s rotation AROM to at least 45 degrees to improve their ability to perform head turns before cross street. (Updated goal)               RC: 34 deg B (09/01/23)    RC: progressed Right: 40 degrees, Left: 20 degrees    Next PN/ RC due 10/01/23 - 10/30/23  Auth due (visit number/ date) 10 more til 10/30/23    PLAN  - Continue Plan of Care    [place], [place]    10/06/2023    11:54 AM  If an interpreting service was utilized for treatment of this patient, the contents of this document represent the material reviewed with the patient via the interpreter.     Future Appointments   Date Time Provider Department Center   10/06/2023  1:30 PM Durel Salts, PT [facility]

## 2023-11-03 ENCOUNTER — Inpatient Hospital Stay: Admit: 2023-11-03 | Payer: MEDICARE | Primary: Physician Assistant

## 2023-11-03 DIAGNOSIS — M542 Cervicalgia: Secondary | ICD-10-CM

## 2023-11-03 NOTE — Progress Notes (Signed)
 [facility] - [address]  [address]  [place], [place] [address]  [phone] fax    CONTINUED PLAN OF CARE/RECERTIFICATION FOR PHYSICAL THERAPY          Patient Name: [name] Short DOB: 02-21-1956   Treatment/Medical Diagnosis: Neck pain [M54.2]  Other low back pain [M54.59]   Onset Date: Ref date: 05/07/23     Referral Source: Elmer Ramp, MD Start of Care [facility]): 08/04/23   Prior Hospitalization: See Medical History Provider #: [id number]   Prior Level of Function: Assistance from care giver for most self care    Comorbidities: Hx of CVA (2023), DM, Arthritis, HTN, C/C fusion, Hx of sacral surgery for wound.    Visits from [facility]: 11 Missed Visits: 0     Progress to Goals:    Pt will be able to improve strength in bilateral shoulder flexion, elbow flexion/extension, ER to at least 5/5 to improve patient's ability to lift objects into cabinet at home & community. (Initial LTG)              Last RC: Shoulder flexion: right 4/5, left 4+/5; Elbow Right 4+/5, left 5/5, Tricep = 5/5               RC: shoulder flexion: 4/5 bilaterally, elbow flexion, 4+/5 right, 4+/5 left; Tricep 5/5     2. Pt will improve NDI (functional outcome name) score to 22 to demonstrate improvement in patient's ability to perform unrestricted ADLs/IADLs at home & community. (Initial LTG)              Last RC: 29 points                RC: progressed, 26 points      3. Pt will report compliance with home HEP at end of care to enhance carry over of fuctional gains made with skilled therapy services in order to improve quality of life. (Initial LTG)               last RC: progressing., Compliance with 1 exercise a day - encouraged to complete all of her HEP everyday.                RC: reports non-compliance with HEP      4. Pt will improve her 5 time sit to stand time to least 45 seconds to demonstrate improvement in her functional LE strength & decrease her risk for falls at home.                          last  RC: Progressing, 53 seconds 2WW               RC: progressing 47 seconds with FWW with UE support     5. Pt will improve bilateral c/s rotation AROM to at least 45 degrees to improve their ability to perform head turns before cross street. (Updated goal)              Last RC: progressed Right: 40 degrees, Left: 20 degrees              RC: progressing, 40 degrees right, left: 26 degrees    Key Functional Changes/Progress:     Functional Gains: Turning her neck a bit better; minimal functional gains due to lack of f/u  Functional Deficits: Knees continue to give her issues & her lower back pain; reaching overhead is hard and  unable to lift a jug out the top of the shelf. Decreased standing/walking tolerance.  % improvement: 35%  Pain   Average: 6/10                  Best: 4/10                Worst: 8/10  Patient Goal: "To be able able to walk again"    Problem List: pain affecting function, decrease ROM, decrease strength, impaired gait/balance, decrease ADL/functional abilities, decrease activity tolerance, decrease flexibility/joint mobility, and decrease transfer abilities    Treatment Plan may include any combination of the following: 13244 Therapeutic Exercise, 97112 Neuromuscular Re-Education, 97140 Manual Therapy, 97530 Therapeutic Activity, 97535 Self Care/Home Management, 97014 Electrical Stim unattended / [phone] [facility]), [phone] Electrical Stim attended, and [phone] Gait Training  Patient Goal(s) has been updated and includes: "To be able able to walk again"    Goals for this certification period include and are to be achieved in   5  weeks:    Pt will be able to improve strength in bilateral shoulder flexion, elbow flexion/extension, ER to at least 5/5 to improve patient's ability to lift objects into cabinet at home & community. (Initial LTG)               RC: shoulder flexion: 4/5 bilaterally, elbow flexion, 4+/5 right, 4+/5 left; Tricep 5/5     2. Pt will improve NDI (functional outcome name) score to 22 to  demonstrate improvement in patient's ability to perform unrestricted ADLs/IADLs at home & community. (Initial LTG)               RC: progressed, 26 points      3. Pt will report compliance with home HEP at end of care to enhance carry over of fuctional gains made with skilled therapy services in order to improve quality of life. (Initial LTG)              RC: reports non-compliance with HEP      4. Pt will improve her 5 time sit to stand time to least 45 seconds to demonstrate improvement in her functional LE strength & decrease her risk for falls at home.                          RC: progressing 47 seconds with FWW with UE support     5. Pt will improve bilateral c/s rotation AROM to at least 45 degrees to improve their ability to perform head turns before cross street. (Updated goal)              RC: progressing, 40 degrees right, left: 26 degrees    Frequency / Duration:   Patient to be seen   2   times per week for   5    weeks:  Frequency / Duration: Patient would benefit from skilled PT 2 times per week for 24 VISITS sessions as needed in this certification period.  Goals will be assigned and reassessed every 10 visits/ 30 days per Medicare guidelines    Assessments/Recommendations:     Pt self-reports 35% improvement since beginning PT with slow progress. Pt reports functional gains that include: Turning her neck a bit better; minimal functional gains due to lack of f/u. Pt reports functional deficits that include: Knees continue to give her issues & her lower back pain; reaching overhead is hard and unable to lift a jug out the top  of the shelf. Decreased standing/walking tolerance.. Pt reports a recent average pain of 6/10, 8/10 at worst, and 4/10 at best. Objectively, she demonstrates no regression with her C/S mobility & 5TSTS despite lack of consistent f/u these last couple of weeks. Regression however was noted with UE strength MMT during assessment today. She is scheduled for more consistent visits. Pt  would benefit from continued skilled PT to address remaining functional deficits. We will continue with PT at 2x/wk for 5 weeks.     If you have any questions/comments please contact us directly at [phone].   Thank you for allowing [place] to assist in the care of your patient.    Certification Period: 11/03/23 - 12/02/23  Reporting Period (date from last assessment to current assessment): 10/01/23 - 11/03/23    Irwin Brakeman, PT       11/03/2023       1:20 PM      ___ I have read the above report and request that my patient continue as recommended.   ___ I have read the above report and request that my patient continue therapy with the following changes/special instructions: ________________________________________________   ___ I have read the above report and request that my patient be discharged from therapy.     Physician's Signature:_________________________   DATE:_________   TIME:________                           Elmer Ramp, MD    ** Signature, Date and Time must be completed for valid certification **  Please sign and return to [facility] or you may fax the signed copy to [phone]  Thank you.

## 2023-11-03 NOTE — Progress Notes (Signed)
 PHYSICAL / OCCUPATIONAL THERAPY - DAILY TREATMENT NOTE    Patient Name: [name] Short    Date: 11/03/2023    DOB: [birth year]  Insurance: Payor: BCBS MEDICARE / Plan: ANTHEM FULL DUAL ADVANTAGE 2 / Product Type: *No Product type* /      Patient DOB verified Yes     Visit #   Current / Total 1 10   Time   In / Out 1210 1240   Pain   In / Out 6 6   Subjective Functional Status/Changes: States that she's had a lapse in care due to waiting on insurance auth.      TREATMENT AREA =  Neck pain  Other low back pain    OBJECTIVE         Therapeutic Procedures:    Tx Min Billable or 1:1 Min (if diff from Tx Min) Procedure, Rationale, Specifics   20 20 97530 Therapeutic Activity (timed):  use of dynamic activities replicating functional movements to increase ROM, strength, coordination, balance, and proprioception in order to improve patient's ability to progress to PLOF and address remaining functional goals.  (see flow sheet as applicable)     Details if applicable:       10 10 97535 Self Care/Home Management (timed):  improve patient knowledge and understanding of home injury/symptom/pain management, positioning, posture/ergonomics, home safety, and activity modification  to improve patient's ability to progress to PLOF and address remaining functional goals.  (see flow sheet as applicable)     Details if applicable:  Pt education   30 30 MC BC Totals Reminder: bill using total billable min of TIMED therapeutic procedures (example: do not include dry needle or estim unattended, both untimed codes, in totals to left)  8-22 min = 1 unit; 23-37 min = 2 units; 38-52 min = 3 units; 53-67 min = 4 units; 68-82 min = 5 units   Total Total     Charge Capture    [x]   Patient Education billed concurrently with other procedures   [x]  Review HEP    []  Progressed/Changed HEP, detail:    []  Other detail:       Objective Information/Functional Measures/Assessment    Pt self-reports 35% improvement since beginning PT with slow progress. Pt  reports functional gains that include: Turning her neck a bit better; minimal functional gains due to lack of f/u. Pt reports functional deficits that include: Knees continue to give her issues & her lower back pain; reaching overhead is hard and unable to lift a jug out the top of the shelf. Decreased standing/walking tolerance.. Pt reports a recent average pain of 6/10, 8/10 at worst, and 4/10 at best. Objectively, she demonstrates no regression with her C/S mobility & 5TSTS despite lack of consistent f/u these last couple of weeks. Regression however was noted with UE strength MMT during assessment today. She is scheduled for more consistent visits. Pt would benefit from continued skilled PT to address remaining functional deficits. We will continue with PT at 2x/wk for 5 weeks.     Functional Gains: Turning her neck a bit better; minimal functional gains due to lack of f/u  Functional Deficits: Knees continue to give her issues & her lower back pain; reaching overhead is hard and unable to lift a jug out the top of the shelf. Decreased standing/walking tolerance.  % improvement: 35%  Pain   Average: 6/10       Best: 4/10     Worst: 8/10  Patient Goal: "To be  able able to walk again"      Patient will continue to benefit from skilled PT / OT services to modify and progress therapeutic interventions, analyze and address functional mobility deficits, analyze and address ROM deficits, analyze and address strength deficits, analyze and address soft tissue restrictions, analyze and cue for proper movement patterns, analyze and modify for postural abnormalities, analyze and address imbalance/dizziness, and instruct in home and community integration to address functional deficits and attain remaining goals.    Progress toward goals / Updated goals:  []   See Progress Note/Recertification    Pt will be able to improve strength in bilateral shoulder flexion, elbow flexion/extension, ER to at least 5/5 to improve patient's  ability to lift objects into cabinet at home & community. (Initial LTG)              Last RC: Shoulder flexion: right 4/5, left 4+/5; Elbow Right 4+/5, left 5/5, Tricep = 5/5    RC: shoulder flexion: 4/5 bilaterally, elbow flexion, 4+/5 right, 4+/5 left; Tricep 5/5    2. Pt will improve NDI (functional outcome name) score to 22 to demonstrate improvement in patient's ability to perform unrestricted ADLs/IADLs at home & community. (Initial LTG)              Last RC: 29 points     RC: progressed, 26 points     3. Pt will report compliance with home HEP at end of care to enhance carry over of fuctional gains made with skilled therapy services in order to improve quality of life. (Initial LTG)               last RC: progressing., Compliance with 1 exercise a day - encouraged to complete all of her HEP everyday.     RC: reports non-compliance with HEP      4. Pt will improve her 5 time sit to stand time to least 45 seconds to demonstrate improvement in her functional LE strength & decrease her risk for falls at home.                          last RC: Progressing, 53 seconds 2WW    RC: progressing 47 seconds with FWW with UE support     5. Pt will improve bilateral c/s rotation AROM to at least 45 degrees to improve their ability to perform head turns before cross street. (Updated goal)              Last RC: progressed Right: 40 degrees, Left: 20 degrees   RC: 40 degrees right, left: 26 degrees    Next PN/ RC due 12/02/23  Auth due (visit number/ date) 9 more til 01/01/24    PLAN  - Continue Plan of Care    [place], [place]    11/03/2023    12:14 PM  If an interpreting service was utilized for treatment of this patient, the contents of this document represent the material reviewed with the patient via the interpreter.     Future Appointments   Date Time Provider Department Center   11/05/2023 12:10 PM Marlyne Beards, PT [facility]   11/10/2023 12:10 PM Jennette Dubin, PT [facility]   11/12/2023 12:10 PM Marlyne Beards, PT  [facility]   11/17/2023 12:10 PM Valerie Roys [facility]   11/19/2023 12:10 PM Irwin Brakeman, [place] [facility]   11/24/2023 12:10 PM Jennette Dubin, PT [facility]   11/26/2023 12:10 PM  Marlyne Beards, PT [facility]   12/01/2023 12:10 PM Jennette Dubin, PT [facility]

## 2023-11-05 ENCOUNTER — Inpatient Hospital Stay: Admit: 2023-11-05 | Payer: MEDICARE | Primary: Physician Assistant

## 2023-11-05 NOTE — Progress Notes (Signed)
 PHYSICAL / OCCUPATIONAL THERAPY - DAILY TREATMENT NOTE    Patient Name: [name] Short    Date: 11/05/2023    DOB: February 01, 1956  Insurance: Payor: BCBS MEDICARE / Plan: ANTHEM FULL DUAL ADVANTAGE 2 / Product Type: *No Product type* /      Patient DOB verified Yes     Visit #   Current / Total 2 10   Time   In / Out 1210 1250   Pain   In / Out 7/10 7/10   Subjective Functional Status/Changes: "My neck is hurting today"     TREATMENT AREA =  Neck pain  Other low back pain    OBJECTIVE    Therapeutic Procedures:    Tx Min Billable or 1:1 Min (if diff from Tx Min) Procedure, Rationale, Specifics   30 18 97110 Therapeutic Exercise (timed):  increase ROM, strength, coordination, balance, and proprioception to improve patient's ability to progress to PLOF and address remaining functional goals. (see flow sheet as applicable)     Details if applicable:       10 10 97112 Neuromuscular Re-Education (timed):  improve balance, coordination, kinesthetic sense, posture, core stability and proprioception to improve patient's ability to develop conscious control of individual muscles and awareness of position of extremities in order to progress to PLOF and address remaining functional goals. (see flow sheet as applicable)     Details if applicable:  scap and c/s re-ed   40 28 MC BC Totals Reminder: bill using total billable min of TIMED therapeutic procedures (example: do not include dry needle or estim unattended, both untimed codes, in totals to left)  8-22 min = 1 unit; 23-37 min = 2 units; 38-52 min = 3 units; 53-67 min = 4 units; 68-82 min = 5 units   Total Total     Charge Capture    [x]   Patient Education billed concurrently with other procedures   [x]  Review HEP    []  Progressed/Changed HEP, detail:    []  Other detail:       Objective Information/Functional Measures/Assessment    Patient participated in today's session without adverse effect. Continued limitations in cervical and thoracic mobility. Added 1/2 foam roll to thoracic  extensions with good return. Fair tolerance with familiar exercises. Fair mechanics with scap ER with RTB; cuing for scap retraction. Pt requested adding D2 PNF flex and performed with good tolerance using RTB. Progress as able.     Patient will continue to benefit from skilled PT / OT services to modify and progress therapeutic interventions, analyze and address functional mobility deficits, analyze and address ROM deficits, analyze and address strength deficits, analyze and address soft tissue restrictions, analyze and cue for proper movement patterns, analyze and modify for postural abnormalities, analyze and address imbalance/dizziness, and instruct in home and community integration to address functional deficits and attain remaining goals.    Progress toward goals / Updated goals:  []   See Progress Note/Recertification    Pt will be able to improve strength in bilateral shoulder flexion, elbow flexion/extension, ER to at least 5/5 to improve patient's ability to lift objects into cabinet at home & community. (Initial LTG)               RC: shoulder flexion: 4/5 bilaterally, elbow flexion, 4+/5 right, 4+/5 left; Tricep 5/5     2. Pt will improve NDI (functional outcome name) score to 22 to demonstrate improvement in patient's ability to perform unrestricted ADLs/IADLs at home & community. (Initial LTG)  RC: progressed, 26 points      Assess at next 30-day mark (11/05/23)     3. Pt will report compliance with home HEP at end of care to enhance carry over of fuctional gains made with skilled therapy services in order to improve quality of life. (Initial LTG)              RC: reports non-compliance with HEP      4. Pt will improve her 5 time sit to stand time to least 45 seconds to demonstrate improvement in her functional LE strength & decrease her risk for falls at home.                          RC: progressing 47 seconds with FWW with UE support     5. Pt will improve bilateral c/s rotation AROM to  at least 45 degrees to improve their ability to perform head turns before cross street. (Updated goal)              RC: progressing, 40 degrees right, left: 26 degrees    Next PN/ RC due 12/02/23  Auth due (visit number/ date) 8 more til 01/01/24    PLAN  - Continue Plan of Care    Marlyne Beards, PT    11/05/2023    12:34 PM  If an interpreting service was utilized for treatment of this patient, the contents of this document represent the material reviewed with the patient via the interpreter.     Future Appointments   Date Time Provider Department Center   11/10/2023 12:10 PM Jennette Dubin, PT [facility]   11/12/2023 12:10 PM Marlyne Beards, PT [facility]   11/17/2023 12:10 PM Valerie Roys [facility]   11/19/2023 12:10 PM Irwin Brakeman, [place] [facility]   11/24/2023 12:10 PM Jennette Dubin, PT [facility]   11/26/2023 12:10 PM Marlyne Beards, PT [facility]   12/01/2023 12:10 PM Jennette Dubin, PT [facility]

## 2023-11-10 ENCOUNTER — Inpatient Hospital Stay: Admit: 2023-11-10 | Payer: MEDICARE | Primary: Physician Assistant

## 2023-11-10 NOTE — Progress Notes (Signed)
 PHYSICAL / OCCUPATIONAL THERAPY - DAILY TREATMENT NOTE    Patient Name: [name] Short    Date: 11/10/2023    DOB: 1956-01-14  Insurance: Payor: BCBS MEDICARE / Plan: ANTHEM FULL DUAL ADVANTAGE 2 / Product Type: *No Product type* /      Patient DOB verified Yes     Visit #   Current / Total 3 10   Time   In / Out 1214 1252   Pain   In / Out 6 7   Subjective Functional Status/Changes: "I'm hurting because I haven't done heat."     TREATMENT AREA =  Neck pain  Other low back pain    OBJECTIVE         Therapeutic Procedures:    Tx Min Billable or 1:1 Min (if diff from Tx Min) Procedure, Rationale, Specifics   10 10 97110 Therapeutic Exercise (timed):  increase ROM, strength, coordination, balance, and proprioception to improve patient's ability to progress to PLOF and address remaining functional goals. (see flow sheet as applicable)     Details if applicable:       20 20 97112 Neuromuscular Re-Education (timed):  improve balance, coordination, kinesthetic sense, posture, core stability and proprioception to improve patient's ability to develop conscious control of individual muscles and awareness of position of extremities in order to progress to PLOF and address remaining functional goals. (see flow sheet as applicable)     Details if applicable:     8 8 97530 Therapeutic Activity (timed):  use of dynamic activities replicating functional movements to increase ROM, strength, coordination, balance, and proprioception in order to improve patient's ability to progress to PLOF and address remaining functional goals.  (see flow sheet as applicable)     Details if applicable:     38 38 MC BC Totals Reminder: bill using total billable min of TIMED therapeutic procedures (example: do not include dry needle or estim unattended, both untimed codes, in totals to left)  8-22 min = 1 unit; 23-37 min = 2 units; 38-52 min = 3 units; 53-67 min = 4 units; 68-82 min = 5 units   Total Total     Charge Capture    [x]   Patient Education billed  concurrently with other procedures   [x]  Review HEP    []  Progressed/Changed HEP, detail:    []  Other detail:       Objective Information/Functional Measures/Assessment    Pt is making progress with her mobility. Still ambulates with downward gaze and kyphotic posture with FWW. Reports that she is inconsistent with her HEP, but completes it "somewhat." Educated on importance of completing daily to maximize carryover. Continues to need cuing with sit to stand transfers to get nose over toes vs pushing back and pulling up with bilateral UE vs pushing from chair. Continue to progress balance activities as able.      Patient will continue to benefit from skilled PT / OT services to modify and progress therapeutic interventions, analyze and address functional mobility deficits, analyze and address ROM deficits, analyze and address strength deficits, analyze and address soft tissue restrictions, analyze and cue for proper movement patterns, analyze and modify for postural abnormalities, analyze and address imbalance/dizziness, and instruct in home and community integration to address functional deficits and attain remaining goals.    Progress toward goals / Updated goals:  []   See Progress Note/Recertification    Pt will be able to improve strength in bilateral shoulder flexion, elbow flexion/extension, ER to at least 5/5  to improve patient's ability to lift objects into cabinet at home & community. (Initial LTG)               RC: shoulder flexion: 4/5 bilaterally, elbow flexion, 4+/5 right, 4+/5 left; Tricep 5/5     2. Pt will improve NDI (functional outcome name) score to 22 to demonstrate improvement in patient's ability to perform unrestricted ADLs/IADLs at home & community. (Initial LTG)               RC: progressed, 26 points                 Assess at next 30-day mark (11/05/23)      3. Pt will report compliance with home HEP at end of care to enhance carry over of fuctional gains made with skilled therapy services  in order to improve quality of life. (Initial LTG)              RC: reports non-compliance with HEP     Pt reports completing HEP "somewhat" [Date assessed: 11/10/2023]    4. Pt will improve her 5 time sit to stand time to least 45 seconds to demonstrate improvement in her functional LE strength & decrease her risk for falls at home.                          RC: progressing 47 seconds with FWW with UE support     5. Pt will improve bilateral c/s rotation AROM to at least 45 degrees to improve their ability to perform head turns before cross street. (Updated goal)              RC: progressing, 40 degrees right, left: 26 degrees    Next PN/ RC due 12/02/2023  Auth due (visit number/ date) 7 visits remaining by 01/01/2024    PLAN  - Continue Plan of Care    Jennette Dubin, PTA, CSCS    11/10/2023    11:33 AM  If an interpreting service was utilized for treatment of this patient, the contents of this document represent the material reviewed with the patient via the interpreter.     Future Appointments   Date Time Provider Department Center   11/10/2023 12:10 PM Jennette Dubin, PT [facility]   11/12/2023 12:10 PM Marlyne Beards, PT [facility]   11/17/2023 12:10 PM Valerie Roys [facility]   11/19/2023 12:10 PM Irwin Brakeman, [place] [facility]   11/24/2023 12:10 PM Jennette Dubin, PT [facility]   11/26/2023 12:10 PM Marlyne Beards, PT [facility]   12/01/2023 12:10 PM Jennette Dubin, PT [facility]

## 2023-11-12 ENCOUNTER — Encounter: Payer: MEDICARE | Primary: Physician Assistant

## 2023-11-17 ENCOUNTER — Inpatient Hospital Stay: Admit: 2023-11-17 | Primary: Physician Assistant

## 2023-11-17 ENCOUNTER — Inpatient Hospital Stay: Admit: 2023-11-17 | Payer: Medicare (Managed Care) | Primary: Physician Assistant

## 2023-11-17 LAB — LABCORP SPECIMEN COLLECTION

## 2023-11-17 NOTE — Progress Notes (Signed)
 PHYSICAL / OCCUPATIONAL THERAPY - DAILY TREATMENT NOTE    Patient Name: [name] Short    Date: 11/17/2023    DOB: [birth year]  Insurance: Payor: BCBS MEDICARE / Plan: ANTHEM FULL DUAL ADVANTAGE 2 / Product Type: *No Product type* /      Patient DOB verified Yes     Visit #   Current / Total 4 10   Time   In / Out 1215 100   Pain   In / Out 7 5   Subjective Functional Status/Changes: "I think my neck and back hurt because I'm learning forward so much"     TREATMENT AREA =  Neck pain  Other low back pain    OBJECTIVE         Therapeutic Procedures:    Tx Min Billable or 1:1 Min (if diff from Tx Min) Procedure, Rationale, Specifics   15 15 97110 Therapeutic Exercise (timed):  increase ROM, strength, coordination, balance, and proprioception to improve patient's ability to progress to PLOF and address remaining functional goals. (see flow sheet as applicable)     Details if applicable:       15 15 97112 Neuromuscular Re-Education (timed):  improve balance, coordination, kinesthetic sense, posture, core stability and proprioception to improve patient's ability to develop conscious control of individual muscles and awareness of position of extremities in order to progress to PLOF and address remaining functional goals. (see flow sheet as applicable)     Details if applicable:     15 15 97530 Therapeutic Activity (timed):  use of dynamic activities replicating functional movements to increase ROM, strength, coordination, balance, and proprioception in order to improve patient's ability to progress to PLOF and address remaining functional goals.  (see flow sheet as applicable)     Details if applicable:     45 45 MC BC Totals Reminder: bill using total billable min of TIMED therapeutic procedures (example: do not include dry needle or estim unattended, both untimed codes, in totals to left)  8-22 min = 1 unit; 23-37 min = 2 units; 38-52 min = 3 units; 53-67 min = 4 units; 68-82 min = 5 units   Total Total     Charge  Capture    [x]   Patient Education billed concurrently with other procedures   [x]  Review HEP    []  Progressed/Changed HEP, detail:    []  Other detail:       Objective Information/Functional Measures/Assessment    Towel roll utilized at L/S during Nustep warmup and then again during pulleys for increased upright posture; patient with report of improved comfort. Patient educated on maintaining body within FWW during ambulation for additional upright posturing/decreased forward head posturing during gait for decreased fall risk; she verbalized understanding. Increased repetitions during T//s extensions for additional T/S and C/S mobility.     Patient will continue to benefit from skilled PT / OT services to modify and progress therapeutic interventions, analyze and address functional mobility deficits, analyze and address ROM deficits, analyze and address strength deficits, analyze and address soft tissue restrictions, analyze and cue for proper movement patterns, analyze and modify for postural abnormalities, analyze and address imbalance/dizziness, and instruct in home and community integration to address functional deficits and attain remaining goals.    Progress toward goals / Updated goals:  []   See Progress Note/Recertification    Pt will be able to improve strength in bilateral shoulder flexion, elbow flexion/extension, ER to at least 5/5 to improve patient's ability to lift objects into  cabinet at home & community. (Initial LTG)               RC: shoulder flexion: 4/5 bilaterally, elbow flexion, 4+/5 right, 4+/5 left; Tricep 5/5     2. Pt will improve NDI (functional outcome name) score to 22 to demonstrate improvement in patient's ability to perform unrestricted ADLs/IADLs at home & community. (Initial LTG)               RC: progressed, 26 points                 Assess at next 30-day mark (11/05/23)      3. Pt will report compliance with home HEP at end of care to enhance carry over of fuctional gains made  with skilled therapy services in order to improve quality of life. (Initial LTG)              RC: reports non-compliance with HEP     Pt reports completing HEP "somewhat" [Date assessed: 11/10/2023]    4. Pt will improve her 5 time sit to stand time to least 45 seconds to demonstrate improvement in her functional LE strength & decrease her risk for falls at home.                          RC: progressing 47 seconds with FWW with UE support     5. Pt will improve bilateral c/s rotation AROM to at least 45 degrees to improve their ability to perform head turns before cross street. (Updated goal)              RC: progressing, 40 degrees right, left: 26 degrees   Current: progressing, 42 deg right rot; 26 deg L rot (with L/S towel roll utilized) 11/17/23    Next PN/ RC due 12/02/2023  Auth due (visit number/ date) 6 visits remaining by 01/01/2024    PLAN  - Continue Plan of Care    Particia Jasper, PTAA, CSCS    11/17/2023    9:14 AM  If an interpreting service was utilized for treatment of this patient, the contents of this document represent the material reviewed with the patient via the interpreter.     Future Appointments   Date Time Provider Department Center   11/17/2023 12:10 PM Valerie Roys [facility]   11/19/2023 12:10 PM Irwin Brakeman, [place] [facility]   11/24/2023 12:10 PM Jennette Dubin, PT [facility]   11/26/2023 12:10 PM Marlyne Beards, PT [facility]   12/01/2023 12:10 PM Jennette Dubin, PT [facility]

## 2023-11-19 ENCOUNTER — Encounter: Payer: Medicare (Managed Care) | Primary: Physician Assistant

## 2023-11-23 ENCOUNTER — Other Ambulatory Visit: Payer: Self-pay | Admitting: Student

## 2023-11-23 DIAGNOSIS — I5042 Chronic combined systolic (congestive) and diastolic (congestive) heart failure: Secondary | ICD-10-CM

## 2023-11-23 DIAGNOSIS — Z8639 Personal history of other endocrine, nutritional and metabolic disease: Secondary | ICD-10-CM

## 2023-11-24 ENCOUNTER — Inpatient Hospital Stay: Admit: 2023-11-24 | Payer: Medicare (Managed Care) | Primary: Physician Assistant

## 2023-11-24 NOTE — Progress Notes (Signed)
 PHYSICAL / OCCUPATIONAL THERAPY - DAILY TREATMENT NOTE    Patient Name: [name] Short    Date: 11/24/2023    DOB: 05-11-1956  Insurance: Payor: BCBS MEDICARE / Plan: ANTHEM FULL DUAL ADVANTAGE 2 / Product Type: *No Product type* /      Patient DOB verified Yes     Visit #   Current / Total 5 10   Time   In / Out 1214 1255   Pain   In / Out 6 5   Subjective Functional Status/Changes: "My neck is hurting more than my back."     TREATMENT AREA =  Neck pain  Other low back pain    OBJECTIVE         Therapeutic Procedures:    Tx Min Billable or 1:1 Min (if diff from Tx Min) Procedure, Rationale, Specifics   10 10 97110 Therapeutic Exercise (timed):  increase ROM, strength, coordination, balance, and proprioception to improve patient's ability to progress to PLOF and address remaining functional goals. (see flow sheet as applicable)     Details if applicable:       20 20 97112 Neuromuscular Re-Education (timed):  improve balance, coordination, kinesthetic sense, posture, core stability and proprioception to improve patient's ability to develop conscious control of individual muscles and awareness of position of extremities in order to progress to PLOF and address remaining functional goals. (see flow sheet as applicable)     Details if applicable:     11 11 97530 Therapeutic Activity (timed):  use of dynamic activities replicating functional movements to increase ROM, strength, coordination, balance, and proprioception in order to improve patient's ability to progress to PLOF and address remaining functional goals.  (see flow sheet as applicable)     Details if applicable:     41 41 MC BC Totals Reminder: bill using total billable min of TIMED therapeutic procedures (example: do not include dry needle or estim unattended, both untimed codes, in totals to left)  8-22 min = 1 unit; 23-37 min = 2 units; 38-52 min = 3 units; 53-67 min = 4 units; 68-82 min = 5 units   Total Total     Charge Capture    [x]   Patient Education billed  concurrently with other procedures   [x]  Review HEP    []  Progressed/Changed HEP, detail:    []  Other detail:       Objective Information/Functional Measures/Assessment    Pt continues to ambulate with downward gaze and kyphotic posture. Improved posture with sitting with towel roll behind thoracic spine. Continues to require safety cues at times for hand placement with her FWW. Strength improvements remain slow, but steady.     Patient will continue to benefit from skilled PT / OT services to modify and progress therapeutic interventions, analyze and address functional mobility deficits, analyze and address ROM deficits, analyze and address strength deficits, analyze and address soft tissue restrictions, analyze and cue for proper movement patterns, analyze and modify for postural abnormalities, analyze and address imbalance/dizziness, and instruct in home and community integration to address functional deficits and attain remaining goals.    Progress toward goals / Updated goals:  []   See Progress Note/Recertification    Pt will be able to improve strength in bilateral shoulder flexion, elbow flexion/extension, ER to at least 5/5 to improve patient's ability to lift objects into cabinet at home & community. (Initial LTG)               RC: shoulder flexion: 4/5  bilaterally, elbow flexion, 4+/5 right, 4+/5 left; Tricep 5/5     2. Pt will improve NDI (functional outcome name) score to 22 to demonstrate improvement in patient's ability to perform unrestricted ADLs/IADLs at home & community. (Initial LTG)               RC: progressed, 26 points                 Assess at next 30-day mark (11/05/23)      3. Pt will report compliance with home HEP at end of care to enhance carry over of fuctional gains made with skilled therapy services in order to improve quality of life. (Initial LTG)              RC: reports non-compliance with HEP                Reports completing 5 days per week currently [Date assessed:  11/24/2023]     4. Pt will improve her 5 time sit to stand time to least 45 seconds to demonstrate improvement in her functional LE strength & decrease her risk for falls at home.                          RC: progressing 47 seconds with FWW with UE support      5. Pt will improve bilateral c/s rotation AROM to at least 45 degrees to improve their ability to perform head turns before cross street. (Updated goal)              RC: progressing, 40 degrees right, left: 26 degrees              Current: progressing, 42 deg right rot; 26 deg L rot (with L/S towel roll utilized) 11/17/23    Next PN/ RC due 12/02/2023  Auth due (visit number/ date) 5 remaining by 01/01/2024    PLAN  - Continue Plan of Care    Rayford Cake, PTA, CSCS    11/24/2023    12:09 PM  If an interpreting service was utilized for treatment of this patient, the contents of this document represent the material reviewed with the patient via the interpreter.     Future Appointments   Date Time Provider Department Center   11/24/2023 12:10 PM Rayford Cake, PT [facility]   11/26/2023 12:10 PM Claudie Cull, PT [facility]   12/01/2023 12:10 PM Rayford Cake, PT [facility]   12/03/2023 12:10 PM Carliss Chess, [place] [facility]   12/08/2023 12:10 PM Rayford Cake, PT [facility]

## 2023-11-25 ENCOUNTER — Telehealth: Payer: Self-pay | Admitting: *Deleted

## 2023-11-25 NOTE — Telephone Encounter (Signed)
 Called pt - no anwser; left message for pt to call the office.

## 2023-11-25 NOTE — Telephone Encounter (Signed)
-----   Message from [facility] sent at 11/25/2023  7:22 AM EDT ----- Regarding: appt Can we please schedule an appt for this patient? She is taking furosemide  and her Cr had bumped up in February.   Thank you!   Sincerely,  [place]

## 2023-11-26 ENCOUNTER — Inpatient Hospital Stay: Admit: 2023-11-26 | Payer: Medicare (Managed Care) | Primary: Physician Assistant

## 2023-11-26 NOTE — Progress Notes (Signed)
 PHYSICAL / OCCUPATIONAL THERAPY - DAILY TREATMENT NOTE    Patient Name: [name] Short    Date: 11/26/2023    DOB: 1956-07-18  Insurance: Payor: BCBS MEDICARE / Plan: ANTHEM FULL DUAL ADVANTAGE 2 / Product Type: *No Product type* /      Patient DOB verified Yes     Visit #   Current / Total 6 10   Time   In / Out 1209 1252   Pain   In / Out 7/10 5/10   Subjective Functional Status/Changes: "I don't what's going on with this neck"     TREATMENT AREA =  Neck pain  Other low back pain    OBJECTIVE    Therapeutic Procedures:    Tx Min Billable or 1:1 Min (if diff from Tx Min) Procedure, Rationale, Specifics   25 25 97110 Therapeutic Exercise (timed):  increase ROM, strength, coordination, balance, and proprioception to improve patient's ability to progress to PLOF and address remaining functional goals. (see flow sheet as applicable)     Details if applicable:       15 15 97112 Neuromuscular Re-Education (timed):  improve balance, coordination, kinesthetic sense, posture, core stability and proprioception to improve patient's ability to develop conscious control of individual muscles and awareness of position of extremities in order to progress to PLOF and address remaining functional goals. (see flow sheet as applicable)     Details if applicable:  scap re-ed, c/s re-ed    40 40 MC BC Totals Reminder: bill using total billable min of TIMED therapeutic procedures (example: do not include dry needle or estim unattended, both untimed codes, in totals to left)  8-22 min = 1 unit; 23-37 min = 2 units; 38-52 min = 3 units; 53-67 min = 4 units; 68-82 min = 5 units   Total Total     Charge Capture    [x]   Patient Education billed concurrently with other procedures   [x]  Review HEP    []  Progressed/Changed HEP, detail:    []  Other detail:       Objective Information/Functional Measures/Assessment    Patient participated in today's session without adverse effect. Pt with continued significant kyphotic posture; she reports that she is  working on looking up/ahead more at home/while walking. Emphasis on addressing neck pain today. Trial of c/s ext/retraction isometrics and scapular shrug-roll-depressions for further postural stability. Will continue to progress as able; due for RC NV.       Patient will continue to benefit from skilled PT / OT services to modify and progress therapeutic interventions, analyze and address functional mobility deficits, analyze and address ROM deficits, analyze and address strength deficits, analyze and address soft tissue restrictions, analyze and cue for proper movement patterns, analyze and modify for postural abnormalities, analyze and address imbalance/dizziness, and instruct in home and community integration to address functional deficits and attain remaining goals.    Progress toward goals / Updated goals:  []   See Progress Note/Recertification    Pt will be able to improve strength in bilateral shoulder flexion, elbow flexion/extension, ER to at least 5/5 to improve patient's ability to lift objects into cabinet at home & community. (Initial LTG)               RC: shoulder flexion: 4/5 bilaterally, elbow flexion, 4+/5 right, 4+/5 left; Tricep 5/5     Assess NV    2. Pt will improve NDI (functional outcome name) score to 22 to demonstrate improvement in patient's ability to perform  unrestricted ADLs/IADLs at home & community. (Initial LTG)               RC: progressed, 26 points                 Assess at next 30-day mark (11/05/23)      3. Pt will report compliance with home HEP at end of care to enhance carry over of fuctional gains made with skilled therapy services in order to improve quality of life. (Initial LTG)              RC: reports non-compliance with HEP               Reports completing 5 days per week currently [Date assessed: 11/24/2023]   Assess NV:     4. Pt will improve her 5 time sit to stand time to least 45 seconds to demonstrate improvement in her functional LE strength & decrease her risk  for falls at home.                          RC: progressing 47 seconds with FWW with UE support              Assess NV    5. Pt will improve bilateral c/s rotation AROM to at least 45 degrees to improve their ability to perform head turns before cross street. (Updated goal)              RC: progressing, 40 degrees right, left: 26 degrees              Current: progressing, 42 deg right rot; 26 deg L rot (with L/S towel roll utilized) 11/17/23   Assess NV    Next PN/ RC due 12/02/23  Auth due (visit number/ date) 4 remaining by 01/01/24    PLAN  - Continue Plan of Care    Claudie Cull, PT    11/26/2023    12:12 PM  If an interpreting service was utilized for treatment of this patient, the contents of this document represent the material reviewed with the patient via the interpreter.     Future Appointments   Date Time Provider Department Center   12/01/2023 12:10 PM Rayford Cake, PT [facility]   12/03/2023 12:10 PM Carliss Chess, [place] [facility]   12/08/2023 12:10 PM Rayford Cake, PT [facility]

## 2023-11-27 NOTE — Telephone Encounter (Signed)
 I talked to pt to schedule an appt per Dr Isabell Manzanilla; pt stated she will call back, she has to talk to her son who provides the transportation.

## 2023-12-01 ENCOUNTER — Inpatient Hospital Stay: Admit: 2023-12-01 | Payer: Medicare (Managed Care) | Primary: Physician Assistant

## 2023-12-01 NOTE — Progress Notes (Signed)
 [facility] - [address]  [address]  [place], [place] [address]  [phone] fax    CONTINUED PLAN OF CARE/RECERTIFICATION FOR PHYSICAL THERAPY          Patient Name: [name] Short DOB: 1956-03-14   Treatment/Medical Diagnosis: Neck pain [M54.2]  Other low back pain [M54.59]   Onset Date: Ref date: 05/07/23         Referral Source: [name] Genet, MD Start of Care [facility]): 08/04/23   Prior Hospitalization: See Medical History Provider #: [id number]   Prior Level of Function: Assistance from care giver for most self care    Comorbidities: Hx of CVA (2023), DM, Arthritis, HTN, C/C fusion, Hx of sacral surgery for wound.    Vis[facility] from [facility]: 17 Missed Visits: 0     Progress to Goals:    Pt will be able to improve strength in bilateral shoulder flexion, elbow flexion/extension, ER to at least 5/5 to improve patient's ability to lift objects into cabinet at home & community. (Initial LTG)               RC: shoulder flexion: 4/5 bilaterally, elbow flexion, 4+/5 right, 4+/5 left; Tricep 5/5                PROGRESSING; Shoulder Flexion: 4+/5 bilateral, Shoulder ER: Right 4/5, Left 4-/5, Elbow Flexion 5/5, Elbow Extension: 5/5     2. Pt will improve NDI (functional outcome name) score to 22 to demonstrate improvement in patient's ability to perform unrestricted ADLs/IADLs at home & community. (Initial LTG)               RC: progressed, 26 points                 NOT MET; 27 points     3. Pt will report compliance with home HEP at end of care to enhance carry over of fuctional gains made with skilled therapy services in order to improve quality of life. (Initial LTG)              RC: reports non-compliance with HEP               PROGRESSING; reports increased compliance since her last reassessment     4. Pt will improve her 5 time sit to stand time to least 45 seconds to demonstrate improvement in her functional LE strength & decrease her risk for falls at home.                          RC:  progressing 47 seconds with FWW with UE support              NOT MET; 49  seconds     5. Pt will improve bilateral c/s rotation AROM to at least 45 degrees to improve their ability to perform head turns before cross street. (Updated goal)              RC: progressing, 40 degrees right, left: 26 degrees              PROGRESSING; Right 41 deg, Left 41 deg    Key Functional Changes/Progress:     Functional Gains: lifting light objects, cervical rotation, looking straight ahead, basic ADLs  Functional Deficits: reaching overhead, repetitive reaching, standing/walking in upright posture, looking up, lifting heavier objects  % improvement: 50%  Pain   Average: 5/10  Best: 4/10                Worst: 7/10  Patient Goal: "zero pain"    Problem List: pain affecting function, decrease ROM, decrease strength, impaired gait/balance, decrease ADL/functional abilities, decrease activity tolerance, decrease flexibility/joint mobility, and decrease transfer abilities    Treatment Plan may include any combination of the following: [address] Therapeutic Exercise, 97112 Neuromuscular Re-Education, 97140 Manual Therapy, 97530 Therapeutic Activity, 97535 Self Care/Home Management, 97014 Electrical Stim unattended / [phone] [name]), [phone] Electrical Stim attended, and [phone] Gait Training  Patient Goal(s) has been updated and includes: "zero pain"    Goals for this certification period include and are to be achieved in   5  weeks:    Pt will be able to improve strength in bilateral shoulder flexion, elbow flexion/extension, ER to at least 5/5 to improve patient's ability to lift objects into cabinet at home & community. (Initial LTG)                RC: PROGRESSING; Shoulder Flexion: 4+/5 bilateral, Shoulder ER: Right 4/5, Left 4-/5, Elbow Flexion 5/5, Elbow Extension: 5/5     2. Pt will improve NDI (functional outcome name) score to 22 to demonstrate improvement in patient's ability to perform unrestricted ADLs/IADLs at home &  community. (Initial LTG)               RC:  NOT MET; 27 points     3. Pt will report compliance with home HEP at end of care to enhance carry over of fuctional gains made with skilled therapy services in order to improve quality of life. (Initial LTG)              RC: PROGRESSING; reports increased compliance since her last reassessment     4. Pt will improve her 5 time sit to stand time to least 45 seconds to demonstrate improvement in her functional LE strength & decrease her risk for falls at home.                          RC: NOT MET; 49  seconds     5. Pt will improve bilateral c/s rotation AROM to at least 45 degrees to improve their ability to perform head turns before cross street. (Updated goal)              RC: PROGRESSING; Right 41 deg, Left 41 deg    Frequency / Duration:   Patient to be seen   1-2   times per week for   5    weeks:  Frequency / Duration: Patient would benefit from skilled PT 1-2 times per week for 24 VISITS sessions as needed in this certification period.  Goals will be assigned and reassessed every 10 visits/ 30 days per Medicare guidelines    Assessments/Recommendations:     Ms. Patella self-reports 50% improvement since beginning PT with slow, but steady progress. Pt reports functional gains that include: lifting light objects, cervical rotation, looking straight ahead, basic ADLs. Pt reports functional deficits that include:  reaching overhead, repetitive reaching, standing/walking in upright posture, looking up, lifting heavier objects. Pt reports a recent average pain of 5/10, 7/10 at worst, and 4/10 at best. Objectively pt is showing an increase in bilateral UE strength, but still has some shoulder flexion and ER strength deficits bilaterally. Demonstrates an increase in left cervical rotation, but still limited bilaterally per objective measurements. Continues to require verbal and  tactile cuing to improve upright postural awareness and to reduce downward gaze during ambulation. Pt  would benefit from continued skilled PT to address remaining functional deficits. We will continue with PT at 2x/wk for 5 weeks.     If you have any questions/comments please contact us  directly at [phone].   Thank you for allowing us  to assist in the care of your patient.    Certification Period: 12/01/23 - 12/30/23  Reporting Period (date from last assessment to current assessment): 11/03/23 - 12/01/23    Carliss Chess, PT       12/02/2023       7:36 AM      ___ I have read the above report and request that my patient continue as recommended.   ___ I have read the above report and request that my patient continue therapy with the following changes/special instructions: ________________________________________________   ___ I have read the above report and request that my patient be discharged from therapy.     Physician's Signature:_________________________   DATE:_________   TIME:________                           [name] Genet, MD    ** Signature, Date and Time must be completed for valid certification **  Please sign and return to [facility] or you may fax the signed copy to [phone]  Thank you.

## 2023-12-01 NOTE — Progress Notes (Signed)
 PHYSICAL / OCCUPATIONAL THERAPY - DAILY TREATMENT NOTE    Patient Name: [name] Short    Date: 12/01/2023    DOB: April 13, 1956  Insurance: Payor: BCBS MEDICARE / Plan: ANTHEM FULL DUAL ADVANTAGE 2 / Product Type: *No Product type* /      Patient DOB verified Yes     Visit #   Current / Total 7 10   Time   In / Out 1217 1251   Pain   In / Out 7 6   Subjective Functional Status/Changes: "I have a little pain in my neck."     TREATMENT AREA =  Neck pain  Other low back pain    OBJECTIVE         Therapeutic Procedures:    Tx Min Billable or 1:1 Min (if diff from Tx Min) Procedure, Rationale, Specifics   14 14 97110 Therapeutic Exercise (timed):  increase ROM, strength, coordination, balance, and proprioception to improve patient's ability to progress to PLOF and address remaining functional goals. (see flow sheet as applicable)     Details if applicable:       20 20 97530 Therapeutic Activity (timed):  use of dynamic activities replicating functional movements to increase ROM, strength, coordination, balance, and proprioception in order to improve patient's ability to progress to PLOF and address remaining functional goals.  (see flow sheet as applicable)     Details if applicable:     34 34 MC BC Totals Reminder: bill using total billable min of TIMED therapeutic procedures (example: do not include dry needle or estim unattended, both untimed codes, in totals to left)  8-22 min = 1 unit; 23-37 min = 2 units; 38-52 min = 3 units; 53-67 min = 4 units; 68-82 min = 5 units   Total Total     Charge Capture    [x]   Patient Education billed concurrently with other procedures   [x]  Review HEP    []  Progressed/Changed HEP, detail:    []  Other detail:       Objective Information/Functional Measures/Assessment    NDI 54% (27/50)    5x STS = 49 seconds    AROM Right Left   Cervical Rotation  41 deg  41 deg     MMT Right Left   Shoulder Flexion  4+/5  4+/5   Shoulder ER  4/5  4-/5     MMT  Right Left   Elbow Flexion  5/5   5/5   Elbow  Extension  5/5   5/5     Ms. Smejkal self-reports 50% improvement since beginning PT with slow, but steady progress. Pt reports functional gains that include: lifting light objects, cervical rotation, looking straight ahead, basic ADLs. Pt reports functional deficits that include:  reaching overhead, repetitive reaching, standing/walking in upright posture, looking up, lifting heavier objects. Pt reports a recent average pain of 5/10, 7/10 at worst, and 4/10 at best. Objectively pt is showing an increase in bilateral UE strength, but still has some shoulder flexion and ER strength deficits bilaterally. Demonstrates an increase in left cervical rotation, but still limited bilaterally per objective measurements. Continues to require verbal and tactile cuing to improve upright postural awareness and to reduce downward gaze during ambulation. Pt would benefit from continued skilled PT to address remaining functional deficits. We will continue with PT at 2x/wk for 5 weeks.      Patient will continue to benefit from skilled PT / OT services to modify and progress therapeutic interventions, analyze and  address functional mobility deficits, analyze and address ROM deficits, analyze and address strength deficits, analyze and address soft tissue restrictions, analyze and cue for proper movement patterns, analyze and modify for postural abnormalities, analyze and address imbalance/dizziness, and instruct in home and community integration to address functional deficits and attain remaining goals.    Progress toward goals / Updated goals:  [x]   See Progress Note/Recertification    Pt will be able to improve strength in bilateral shoulder flexion, elbow flexion/extension, ER to at least 5/5 to improve patient's ability to lift objects into cabinet at home & community. (Initial LTG)               RC: shoulder flexion: 4/5 bilaterally, elbow flexion, 4+/5 right, 4+/5 left; Tricep 5/5                PROGRESSING; Shoulder Flexion: 4+/5  bilateral, Shoulder ER: Right 4/5, Left 4-/5, Elbow Flexion 5/5, Elbow Extension: 5/5     2. Pt will improve NDI (functional outcome name) score to 22 to demonstrate improvement in patient's ability to perform unrestricted ADLs/IADLs at home & community. (Initial LTG)               RC: progressed, 26 points                 NOT MET; 27 points     3. Pt will report compliance with home HEP at end of care to enhance carry over of fuctional gains made with skilled therapy services in order to improve quality of life. (Initial LTG)              RC: reports non-compliance with HEP               PROGRESSING; reports increased compliance since her last reassessment     4. Pt will improve her 5 time sit to stand time to least 45 seconds to demonstrate improvement in her functional LE strength & decrease her risk for falls at home.                          RC: progressing 47 seconds with FWW with UE support              NOT MET; 49  seconds     5. Pt will improve bilateral c/s rotation AROM to at least 45 degrees to improve their ability to perform head turns before cross street. (Updated goal)              RC: progressing, 40 degrees right, left: 26 degrees              PROGRESSING; Right 41 deg, Left 41 deg    Functional Gains: lifting light objects, cervical rotation, looking straight ahead, basic ADLs  Functional Deficits: reaching overhead, repetitive reaching, standing/walking in upright posture, looking up, lifting heavier objects  % improvement: 50%  Pain   Average: 5/10       Best: 4/10     Worst: 7/10  Patient Goal: "zero pain"    Next PN/ RC due 12/30/2023  Auth due (visit number/ date) 3 remaining by 01/01/2024    PLAN  - Continue Plan of Care    Rayford Cake, PTA, CSCS    12/01/2023    9:56 AM  If an interpreting service was utilized for treatment of this patient, the contents of this document represent the material reviewed with the patient via the interpreter.  Future Appointments   Date Time Provider  Department Center   12/01/2023 12:10 PM Rayford Cake, PT [facility]   12/03/2023 12:10 PM Robinette Chou [facility]   12/08/2023 12:10 PM Rayford Cake, PT [facility]

## 2023-12-03 ENCOUNTER — Inpatient Hospital Stay: Admit: 2023-12-03 | Payer: Medicare (Managed Care) | Primary: Physician Assistant

## 2023-12-03 DIAGNOSIS — M542 Cervicalgia: Secondary | ICD-10-CM

## 2023-12-03 NOTE — Progress Notes (Signed)
 PHYSICAL / OCCUPATIONAL THERAPY - DAILY TREATMENT NOTE    Patient Name: [name] Short    Date: 12/03/2023    DOB: 1956-04-13  Insurance: Payor: BCBS MEDICARE / Plan: ANTHEM FULL DUAL ADVANTAGE 2 / Product Type: *No Product type* /      Patient DOB verified Yes     Visit #   Current / Total 1 10   Time   In / Out 1215 1255   Pain   In / Out 7 5   Subjective Functional Status/Changes: Pt reports feeling stiff today.     TREATMENT AREA =  Neck pain  Other low back pain    OBJECTIVE         Therapeutic Procedures:    Tx Min Billable or 1:1 Min (if diff from Tx Min) Procedure, Rationale, Specifics   20 20 97110 Therapeutic Exercise (timed):  increase ROM, strength, coordination, balance, and proprioception to improve patient's ability to progress to PLOF and address remaining functional goals. (see flow sheet as applicable)     Details if applicable:       10 10 97112 Neuromuscular Re-Education (timed):  improve balance, coordination, kinesthetic sense, posture, core stability and proprioception to improve patient's ability to develop conscious control of individual muscles and awareness of position of extremities in order to progress to PLOF and address remaining functional goals. (see flow sheet as applicable)     Details if applicable:     10 10 97530 Therapeutic Activity (timed):  use of dynamic activities replicating functional movements to increase ROM, strength, coordination, balance, and proprioception in order to improve patient's ability to progress to PLOF and address remaining functional goals.  (see flow sheet as applicable)     Details if applicable:            Details if applicable:            Details if applicable:     40 40 MC BC Totals Reminder: bill using total billable min of TIMED therapeutic procedures (example: do not include dry needle or estim unattended, both untimed codes, in totals to left)  8-22 min = 1 unit; 23-37 min = 2 units; 38-52 min = 3 units; 53-67 min = 4 units; 68-82 min = 5 units    Total Total     Charge Capture    [x]   Patient Education billed concurrently with other procedures   [x]  Review HEP    []  Progressed/Changed HEP, detail:    []  Other detail:       Objective Information/Functional Measures/Assessment    Pt reports no adverse effects from treatment session.Pt able to perform 1/2 stance exercises with decreased use of her B UE today.Pt needed frequent verbal and tactile cues to increase Pts upright postural awareness due to Pts significant kyphotic posture at this time.Pt able to increase reps performing SB rollouts with good strength and endurance.Reviewed HEP with Pt and she was able to perform demo.Progress as able.    Patient will continue to benefit from skilled PT / OT services to modify and progress therapeutic interventions, analyze and address functional mobility deficits, analyze and address ROM deficits, analyze and address strength deficits, analyze and address soft tissue restrictions, analyze and cue for proper movement patterns, analyze and modify for postural abnormalities, analyze and address imbalance/dizziness, and instruct in home and community integration to address functional deficits and attain remaining goals.    Progress toward goals / Updated goals:  []   See Progress Note/Recertification  Pt will be able to improve strength in bilateral shoulder flexion, elbow flexion/extension, ER to at least 5/5 to improve patient's ability to lift objects into cabinet at home & community. (Initial LTG)                RC: PROGRESSING; Shoulder Flexion: 4+/5 bilateral, Shoulder ER: Right 4/5, Left 4-/5, Elbow Flexion 5/5, Elbow Extension: 5/5     2. Pt will improve NDI (functional outcome name) score to 22 to demonstrate improvement in patient's ability to perform unrestricted ADLs/IADLs at home & community. (Initial LTG)               RC:  NOT MET; 27 points     3. Pt will report compliance with home HEP at end of care to enhance carry over of fuctional gains made with  skilled therapy services in order to improve quality of life. (Initial LTG)              RC: PROGRESSING; reports increased compliance since her last reassessment     4. Pt will improve her 5 time sit to stand time to least 45 seconds to demonstrate improvement in her functional LE strength & decrease her risk for falls at home.                          RC: NOT MET; 49  seconds     5. Pt will improve bilateral c/s rotation AROM to at least 45 degrees to improve their ability to perform head turns before cross street. (Updated goal)              RC: PROGRESSING; Right 41 deg, Left 41 deg    Next PN/ RC due 12/30/23  Auth due (visit number/ date) 2 remaining by 01/01/24    PLAN  - Continue Plan of Care    Markel Silber, PTA    12/03/2023    12:09 PM  If an interpreting service was utilized for treatment of this patient, the contents of this document represent the material reviewed with the patient via the interpreter.     Future Appointments   Date Time Provider Department Center   12/03/2023 12:10 PM Robinette Chou [facility]   12/08/2023 12:10 PM Rayford Cake, PT [facility]

## 2023-12-08 ENCOUNTER — Encounter: Payer: Medicare (Managed Care) | Primary: Physician Assistant

## 2023-12-10 ENCOUNTER — Inpatient Hospital Stay: Admit: 2023-12-10 | Payer: Medicare (Managed Care) | Primary: Physician Assistant

## 2023-12-16 ENCOUNTER — Ambulatory Visit (INDEPENDENT_AMBULATORY_CARE_PROVIDER_SITE_OTHER): Payer: Medicare Other | Admitting: Podiatry

## 2023-12-16 DIAGNOSIS — M79675 Pain in left toe(s): Secondary | ICD-10-CM | POA: Diagnosis not present

## 2023-12-16 DIAGNOSIS — E1142 Type 2 diabetes mellitus with diabetic polyneuropathy: Secondary | ICD-10-CM

## 2023-12-16 DIAGNOSIS — M79674 Pain in right toe(s): Secondary | ICD-10-CM

## 2023-12-16 DIAGNOSIS — B351 Tinea unguium: Secondary | ICD-10-CM | POA: Diagnosis not present

## 2023-12-16 DIAGNOSIS — B353 Tinea pedis: Secondary | ICD-10-CM | POA: Diagnosis not present

## 2023-12-17 ENCOUNTER — Inpatient Hospital Stay: Admit: 2023-12-17 | Payer: Medicare (Managed Care) | Primary: Physician Assistant

## 2023-12-17 NOTE — Progress Notes (Addendum)
 PHYSICAL / OCCUPATIONAL THERAPY - DAILY TREATMENT NOTE    Patient Name: [name] Short    Date: 12/17/2023    DOB: 1955/11/01  Insurance: Payor: BCBS MEDICARE / Plan: ANTHEM FULL DUAL ADVANTAGE 2 / Product Type: *No Product type* /      Patient DOB verified Yes     Visit #   Current / Total 2 10   Time   In / Out 208 248   Pain   In / Out 6 -   Subjective Functional Status/Changes: Patient reports doing okay.     TREATMENT AREA =  Neck pain  Other low back pain    OBJECTIVE        Therapeutic Procedures:    Tx Min Billable or 1:1 Min (if diff from Tx Min) Procedure, Rationale, Specifics   19 19 97110 Therapeutic Exercise (timed):  increase ROM, strength, coordination, balance, and proprioception to improve patient's ability to progress to PLOF and address remaining functional goals. (see flow sheet as applicable)     Details if applicable:       12 12 97112 Neuromuscular Re-Education (timed):  improve balance, coordination, kinesthetic sense, posture, core stability and proprioception to improve patient's ability to develop conscious control of individual muscles and awareness of position of extremities in order to progress to PLOF and address remaining functional goals. (see flow sheet as applicable)     Details if applicable:     11 11 97530 Therapeutic Activity (timed):  use of dynamic activities replicating functional movements to increase ROM, strength, coordination, balance, and proprioception in order to improve patient's ability to progress to PLOF and address remaining functional goals.  (see flow sheet as applicable)     Details if applicable:     42 42 MC BC Totals Reminder: bill using total billable min of TIMED therapeutic procedures (example: do not include dry needle or estim unattended, both untimed codes, in totals to left)  8-22 min = 1 unit; 23-37 min = 2 units; 38-52 min = 3 units; 53-67 min = 4 units; 68-82 min = 5 units   Total Total     Charge Capture    [x]   Patient Education billed concurrently  with other procedures   [x]  Review HEP    []  Progressed/Changed HEP, detail:    []  Other detail:       Objective Information/Functional Measures/Assessment    Patient participated in today's session without adverse effects. Patient continues to be motivated for therapy and exercise progressions. Therapist provided vc/demo for exercise recall. Had good tolerance with exercise progressions with step taps and hip 4-way. Reported tightness in neck and upper back during hip 4-way/march. Patient will be DC at next session and will come back in July with new referral.    Patient will continue to benefit from skilled PT / OT services to modify and progress therapeutic interventions, analyze and address functional mobility deficits, analyze and address ROM deficits, analyze and address strength deficits, analyze and address soft tissue restrictions, analyze and cue for proper movement patterns, analyze and modify for postural abnormalities, analyze and address imbalance/dizziness, and instruct in home and community integration to address functional deficits and attain remaining goals.    Progress toward goals / Updated goals:  []   See Progress Note/Recertification      Pt will be able to improve strength in bilateral shoulder flexion, elbow flexion/extension, ER to at least 5/5 to improve patient's ability to lift objects into cabinet at home & community. (Initial  LTG)                RC: PROGRESSING; Shoulder Flexion: 4+/5 bilateral, Shoulder ER: Right 4/5, Left 4-/5, Elbow Flexion 5/5, Elbow Extension: 5/5     2. Pt will improve NDI (functional outcome name) score to 22 to demonstrate improvement in patient's ability to perform unrestricted ADLs/IADLs at home & community. (Initial LTG)               RC:  NOT MET; 27 points     3. Pt will report compliance with home HEP at end of care to enhance carry over of fuctional gains made with skilled therapy services in order to improve quality of life. (Initial LTG)               RC: PROGRESSING; reports increased compliance since her last reassessment     4. Pt will improve her 5 time sit to stand time to least 45 seconds to demonstrate improvement in her functional LE strength & decrease her risk for falls at home.                          RC: NOT MET; 49  seconds     5. Pt will improve bilateral c/s rotation AROM to at least 45 degrees to improve their ability to perform head turns before cross street. (Updated goal)              RC: PROGRESSING; Right 41 deg, Left 41 deg    Next PN/ RC due 12/30/2023  Auth due (visit number/ date) 1 visits remaining    PLAN  - Continue Plan of Care    Benuel Brazier, PTA    12/17/2023    2:07 PM  If an interpreting service was utilized for treatment of this patient, the contents of this document represent the material reviewed with the patient via the interpreter.     Future Appointments   Date Time Provider Department Center   12/17/2023  2:10 PM Gregoria Leas [facility]   12/29/2023  1:00 PM [facility] CT RM 2 [facility]   04/12/2024 12:00 PM Lilia Reges, MD [facility]

## 2023-12-20 ENCOUNTER — Encounter: Payer: Self-pay | Admitting: Podiatry

## 2023-12-20 NOTE — Progress Notes (Signed)
  Subjective:  Patient ID: Julie Elliott, female    DOB: 08-27-1955,  MRN: [id number]  Julie Elliott presents to clinic today for at risk foot care with history of diabetic neuropathy and painful mycotic toenails of both feet that are difficult to trim. Pain interferes with daily activities and wearing enclosed shoe gear comfortably.  Chief Complaint  Patient presents with   Nail Problem    DFC   New problem(s): None.   PCP is Alexander-Savino, [place], MD.  Allergies  Allergen Reactions   Loratadine-Pseudoephedrine Er Hives    Claritin-D    Review of Systems: Negative except as noted in the HPI.  Objective: No changes noted in today's physical examination. There were no vitals filed for this visit. Keimani Elliott is a pleasant 68 y.o. female obese in NAD. AAO x 3.  Vascular Examination: Capillary refill time immediate b/l. Vascular status intact b/l with palpable DP pulses; faintly palpable PT pulses. Pedal hair absent b/l. No pain with calf compression b/l. Skin temperature gradient WNL b/l. No cyanosis or clubbing noted b/l. No ischemia or gangrene b/l. Nonpitting edema noted BLE.  Neurological Examination: Sensation grossly intact b/l with 10 gram monofilament. Vibratory sensation intact b/l. Pt has subjective symptoms of neuropathy.  Dermatological Examination: Pedal skin with normal turgor, texture and tone b/l.  No open wounds. No interdigital macerations.   Toenails 1-5 b/l thick, discolored, elongated with subungual debris and pain on dorsal palpation.   Toenails 1-5 b/l elongated, discolored, dystrophic, thickened, crumbly with subungual debris and tenderness to dorsal palpation. Peeling noted b/l feet. No interdigital macerations.  No blisters, no weeping. No signs of secondary bacterial infection noted.  Musculoskeletal Examination: Muscle strength 5/5 to all lower extremity muscle groups bilaterally. Pes planus deformity noted bilateral LE.  Radiographs: None  Last A1c:       Latest Ref Rng & Units 09/21/2023    4:01 PM  Hemoglobin A1C  Hemoglobin-A1c 4.0 - 5.6 % 6.2    Assessment/Plan: 1. Pain due to onychomycosis of toenails of both feet   2. Tinea pedis of both feet   3. Diabetic peripheral neuropathy associated with type 2 diabetes mellitus (HCC)    -Consent given for treatment as described below: -Examined patient. -Patient still has Ketoconazole Cream and she was instructed to apply to both feet and between toes once daily for 6 weeks. -Continue foot and shoe inspections daily. Monitor blood glucose per PCP/Endocrinologist's recommendations. -Toenails 1-5 b/l were debrided in length and girth with sterile nail nippers and dremel without iatrogenic bleeding.  -Patient/POA to call should there be question/concern in the interim.   Return in about 3 months (around 03/17/2024).  Luella Sager, DPM      [facility] LOCATION: [address] [place] [address]                   Office [phone]   [facility] LOCATION: [address] [place], [place] [address] Office [phone]

## 2023-12-22 ENCOUNTER — Inpatient Hospital Stay: Admit: 2023-12-22 | Payer: Medicare (Managed Care) | Primary: Physician Assistant

## 2023-12-22 NOTE — Progress Notes (Signed)
 [facility] - [address]  [address]  [place], [place] [address]  [phone] fax    DISCHARGE SUMMARY  Patient Name: [name] Short DOB: [birth year]   Treatment/Medical Diagnosis: Neck pain [M54.2]  Other low back pain [M54.59]   Referral Source: Doylene Genet, MD     Date of Initial Visit: 08/04/23 Attended Visits: 10 Missed Visits: 0     SUMMARY OF TREATMENT  [name] Short has attended 21 visits consistently and made good progress with skilled physical therapy services.  At time of last visit, Pt reported the following:  Functional Gains - ROM, decreased stiffness, mobility, stairs to enter home, walking/standing tolerance; Functional Deficits - looking right and left, looking up, reaching overhead, squatting, sit to stands; and 55% improvement since start of care. Pt reports a recent average pain of 6/10, 8/10 at worst, and 5/10 at best. Pt demonstrates full functional bilateral strength within available ROM per objective measurements. She remains limited with bilateral c/s rotation and continues to have poor postural awareness in standing and sitting. She will be unable to attend therapy for > 30 days so we are discharging to an updated HEP at this time. Pt has met or is progressing towards all goals and is compliant with comprehensive HEP.  Pt is appropriate for D/C at this time to continue to manage care independently and maintain long term gains made with skilled therapy.     CURRENT STATUS    Pt will be able to improve strength in bilateral shoulder flexion, elbow flexion/extension, ER to at least 5/5 to improve patient's ability to lift objects into cabinet at home & community. (Initial LTG)                RC: PROGRESSING; Shoulder Flexion: 4+/5 bilateral, Shoulder ER: Right 4/5, Left 4-/5, Elbow Flexion 5/5, Elbow Extension: 5/5               MET; Shoulder Flexion: Bilateral 5/5, Shoulder ER: 5/5, Elbow Flexion: 5/5, Elbow Extension: 5/5     2. Pt will improve NDI  (functional outcome name) score to 22 to demonstrate improvement in patient's ability to perform unrestricted ADLs/IADLs at home & community. (Initial LTG)               RC:  NOT MET; 27 points               MET; 21 points     3. Pt will report compliance with home HEP at end of care to enhance carry over of fuctional gains made with skilled therapy services in order to improve quality of life. (Initial LTG)              RC: PROGRESSING; reports increased compliance since her last reassessment               MET; reports compliance     4. Pt will improve her 5 time sit to stand time to least 45 seconds to demonstrate improvement in her functional LE strength & decrease her risk for falls at home.                          RC: NOT MET; 49  seconds               MET; 43 seconds     5. Pt will improve bilateral c/s rotation AROM to at least 45 degrees to improve their ability to perform  head turns before cross street. (Updated goal)              RC: PROGRESSING; Right 41 deg, Left 41 deg              NOT MET; Right 25 deg, Left 42 deg     Functional Gains: ROM, decreased stiffness, mobility, stairs to enter home, walking/standing tolerance  Functional Deficits: looking right and left, looking up, reaching overhead, squatting, sit to stands  % improvement: 55%  Pain   Average: 6/10                  Best: 5/10                Worst: 8/10  Patient Goal: "able to get up and hold my head and turn right and left without pain"      RECOMMENDATIONS  Pt to be discharged with an updated HEP to further enhance carry over of functional gains made with PT & independent symptom management. Should the patient require more physical therapy, we would be a happy to work with patient again with an updated referral from a physician.      If you have any questions/comments please contact us  directly at [phone].   Thank you for allowing us  to assist in the care of your patient.  PTA signature       Therapist Signature: Carliss Chess,  PT Date: 01/05/24   Reporting Period: 12/01/23 - 12/22/23 Time: 7:45 AM

## 2023-12-22 NOTE — Progress Notes (Signed)
 Physical Therapy Discharge Instructions      [facility] - [address]  [address]  [place], [place] [address]  [phone] fax      Patient: [name] Short  DOB: 07-30-1956      Continue Home Exercise Program 1-2 times per day       Continue with    [x]  Ice  as needed      [x]  Heat           Follow up with MD:     []  Upon completion of therapy     [x]  As needed      Recommendations:     [x]    Return to activity with home program    []    Return to activity with the following modifications:       [] Post Rehab Program    [] Join Independent aquatic program     [] Return to/join local gym      Taylor, PTA, CSCS  12/22/2023 8:40 AM

## 2023-12-22 NOTE — Progress Notes (Signed)
 PHYSICAL / OCCUPATIONAL THERAPY - DAILY TREATMENT NOTE    Patient Name: [name] Short    Date: 12/22/2023    DOB: 1955/09/24  Insurance: Payor: BCBS MEDICARE / Plan: ANTHEM FULL DUAL ADVANTAGE 2 / Product Type: *No Product type* /      Patient DOB verified Yes     Visit #   Current / Total 3 10   Time   In / Out 1210 100   Pain   In / Out 5 4   Subjective Functional Status/Changes: "My neck is cracking."     TREATMENT AREA =  Neck pain  Other low back pain    OBJECTIVE    Modalities Rationale:     decrease pain to improve patient's ability to progress to PLOF and address remaining functional goals.     min []  Estim Unattended, type/location:                                      []   w/ice    []   w/heat    min []  Estim Attended, type/location:                                     []   w/US      []   w/ice    []   w/heat    []   TENS insruct      min []   Mechanical Traction: type/lbs                   []   pro   []   sup   []   int   []   cont    []   before manual    []   after manual    min []   Ultrasound, settings/location:     10 min  unbill []   Ice     [x]   Heat    location/position:  Neck in sitting    min []   Paraffin,  details:     min []   Vasopneumatic Device, press/temp:     min []   Whirlpool / Fluido:    If using vaso (only need to measure limb vaso being performed on)      pre-treatment girth :       post-treatment girth :       measured at (landmark location) :      min []   Other:    Skin assessment post-treatment:   Intact      Therapeutic Procedures:    Tx Min Billable or 1:1 Min (if diff from Tx Min) Procedure, Rationale, Specifics   10 10 97110 Therapeutic Exercise (timed):  increase ROM, strength, coordination, balance, and proprioception to improve patient's ability to progress to PLOF and address remaining functional goals. (see flow sheet as applicable)     Details if applicable:       20 20 97530 Therapeutic Activity (timed):  use of dynamic activities replicating functional movements to increase ROM, strength,  coordination, balance, and proprioception in order to improve patient's ability to progress to PLOF and address remaining functional goals.  (see flow sheet as applicable)     Details if applicable:     10 10 97535 Self Care/Home Management (timed):  improve patient knowledge and understanding of home injury/symptom/pain management, positioning, posture/ergonomics, home safety, activity modification, transfer techniques, and joint protection strategies  to improve patient's ability to progress to PLOF and address remaining functional goals.  (see flow sheet as applicable)     Details if applicable:     40 40 MC BC Totals Reminder: bill using total billable min of TIMED therapeutic procedures (example: do not include dry needle or estim unattended, both untimed codes, in totals to left)  8-22 min = 1 unit; 23-37 min = 2 units; 38-52 min = 3 units; 53-67 min = 4 units; 68-82 min = 5 units   Total Total     Charge Capture    [x]   Patient Education billed concurrently with other procedures   [x]  Review HEP    []  Progressed/Changed HEP, detail:    []  Other detail:       Objective Information/Functional Measures/Assessment    NDI 42%    5x STS = 43 seconds      AROM Right Left   Cervical Rotation  25 deg  42 deg     MMT Right Left   Shoulder Flexion  5/5  5/5   Shoulder ER  5/5   5/5     MMT  Right Left   Elbow Flexion  5/5   5/5   Elbow Extension  5/5   5/5     Ms. Hickox has attended 21 visits consistently and made good progress with skilled physical therapy services.  At time of last visit, Pt reported the following:  Functional Gains - ROM, decreased stiffness, mobility, stairs to enter home, walking/standing tolerance; Functional Deficits - looking right and left, looking up, reaching overhead, squatting, sit to stands; and 55% improvement since start of care. Pt reports a recent average pain of 6/10, 8/10 at worst, and 5/10 at best. Pt demonstrates full functional bilateral strength within available ROM per objective  measurements. She remains limited with bilateral c/s rotation and continues to have poor postural awareness in standing and sitting. She will be unable to attend therapy for > 30 days so we are discharging to an updated HEP at this time. Pt has met or is progressing towards all goals and is compliant with comprehensive HEP.  Pt is appropriate for D/C at this time to continue to manage care independently and maintain long term gains made with skilled therapy.      Progress toward goals / Updated goals:  []   See Progress Note/Recertification    Pt will be able to improve strength in bilateral shoulder flexion, elbow flexion/extension, ER to at least 5/5 to improve patient's ability to lift objects into cabinet at home & community. (Initial LTG)                RC: PROGRESSING; Shoulder Flexion: 4+/5 bilateral, Shoulder ER: Right 4/5, Left 4-/5, Elbow Flexion 5/5, Elbow Extension: 5/5    MET; Shoulder Flexion: Bilateral 5/5, Shoulder ER: 5/5, Elbow Flexion: 5/5, Elbow Extension: 5/5    2. Pt will improve NDI (functional outcome name) score to 22 to demonstrate improvement in patient's ability to perform unrestricted ADLs/IADLs at home & community. (Initial LTG)               RC:  NOT MET; 27 points    MET; 21 points    3. Pt will report compliance with home HEP at end of care to enhance carry over of fuctional gains made with skilled therapy services in order to improve quality of life. (Initial LTG)              RC: PROGRESSING;  reports increased compliance since her last reassessment    MET; reports compliance    4. Pt will improve her 5 time sit to stand time to least 45 seconds to demonstrate improvement in her functional LE strength & decrease her risk for falls at home.                          RC: NOT MET; 49  seconds    MET; 43 seconds    5. Pt will improve bilateral c/s rotation AROM to at least 45 degrees to improve their ability to perform head turns before cross street. (Updated goal)              RC:  PROGRESSING; Right 41 deg, Left 41 deg   NOT MET; Right 25 deg, Left 42 deg    Functional Gains: ROM, decreased stiffness, mobility, stairs to enter home, walking/standing tolerance  Functional Deficits: looking right and left, looking up, reaching overhead, squatting, sit to stands  % improvement: 55%  Pain   Average: 6/10       Best: 5/10     Worst: 8/10  Patient Goal: "able to get up and hold my head and turn right and left without pain"    PLAN  - Discharge due to : PROGRAM COMPLETE    Rayford Cake, PTA, CSCS    12/22/2023    8:37 AM  If an interpreting service was utilized for treatment of this patient, the contents of this document represent the material reviewed with the patient via the interpreter.     Future Appointments   Date Time Provider Department Center   12/22/2023 12:10 PM Rayford Cake, PT [facility]   12/29/2023  1:00 PM [facility] CT RM 2 [facility]   04/12/2024 12:00 PM Lilia Reges, MD [facility]

## 2023-12-29 ENCOUNTER — Inpatient Hospital Stay: Payer: Medicare (Managed Care) | Attending: Pulmonary Disease | Primary: Physician Assistant

## 2024-01-17 ENCOUNTER — Encounter: Payer: Self-pay | Admitting: *Deleted

## 2024-02-09 ENCOUNTER — Inpatient Hospital Stay: Admit: 2024-02-09 | Payer: Medicare (Managed Care) | Attending: Pulmonary Disease | Primary: Physician Assistant

## 2024-02-09 DIAGNOSIS — Z87891 Personal history of nicotine dependence: Principal | ICD-10-CM

## 2024-02-24 ENCOUNTER — Other Ambulatory Visit: Payer: Self-pay

## 2024-02-24 DIAGNOSIS — Z720 Tobacco use: Secondary | ICD-10-CM

## 2024-02-24 MED ORDER — BUPROPION HCL ER (SR) 150 MG PO TB12
150.0000 mg | ORAL_TABLET | Freq: Two times a day (BID) | ORAL | 2 refills | Status: AC
  Filled 2024-08-24: qty 180, 90d supply, fill #0

## 2024-03-21 ENCOUNTER — Ambulatory Visit: Admit: 2024-03-21 | Discharge: 2024-03-21 | Payer: MEDICARE | Primary: Physician Assistant

## 2024-03-21 VITALS — BP 151/67 | HR 66 | Ht 59.0 in | Wt 154.0 lb

## 2024-03-21 DIAGNOSIS — S31829A Unspecified open wound of left buttock, initial encounter: Principal | ICD-10-CM

## 2024-03-21 NOTE — Telephone Encounter (Signed)
 Pt daughter would like a returned call to get some clarity on some things. [phone] Colleen)

## 2024-03-21 NOTE — Telephone Encounter (Signed)
 Attempted to reach pt, sent to vm. Pt has appt today with Dr Reda.

## 2024-03-21 NOTE — Progress Notes (Signed)
 CC:   Chief Complaint   Patient presents with    Follow-up     Pt daughter states flaky;skin breaking open, patient states sensitivity         Assessment:    ICD-10-CM    1. Buttock wound, left, initial encounter  S31.829A           Plan: The wound present on her left buttocks is superficial and does not appear infected during today's visit. She should continue to keep this area clean and dry and may use a saline-based wound cleanser to clean the wound. I encouraged her daughter to continue applying a barrier cream around the edges of the wound.  She should offload pressure from her buttocks as often as possible.  She will follow-up with me or Dr. Reda as needed with questions or concerns.  She agrees with this plan.      HPI:  [name] Short is a 68 y.o. female who is here today for a wound check. She underwent debridement of stage 4 decubitus with placement of wound vac with Dr. Reda on 06/24/2022. She noticed a new small wound near the previous wound this past week. There is a small area of scar tissue present just superior to the wound. She has been applying a barrier cream around the wound edges. She denies fevers or chills.     Allergies:  Allergies   Allergen Reactions    Lactose Other (See Comments)     Intolerance       Medication Review:  Current Outpatient Medications on File Prior to Visit   Medication Sig Dispense Refill    Probiotic Product (PROBIOTIC BLEND PO) Take by mouth Daily      famotidine  (PEPCID ) 20 MG tablet TAKE 1 TABLET BY MOUTH ONCE A DAY FOR GERD      vitamin C (ASCORBIC ACID) 500 MG tablet Take 1 tablet by mouth daily      insulin  glargine (LANTUS ) 100 UNIT/ML injection vial Inject 10 Units into the skin daily (Patient taking differently: Inject 20 Units into the skin daily) 1 each 0    insulin  lispro (HUMALOG ) 100 UNIT/ML SOLN injection vial Inject 3 Units into the skin 3 times daily (with meals) 1 each 0    hydrALAZINE  (APRESOLINE ) 25 MG tablet Take 1 tablet by mouth every 8  hours (Patient taking differently: Take 1 tablet by mouth 4 times daily As needed elevated SBP <140) 90 tablet 3    potassium chloride  (KLOR-CON  M) 10 MEQ extended release tablet Take 1 tablet by mouth daily      cinacalcet  (SENSIPAR ) 30 MG tablet Take 1 tablet by mouth 2 times daily      carvedilol  (COREG ) 6.25 MG tablet Take 1 tablet by mouth 2 times daily (with meals) 60 tablet 3    baclofen  (LIORESAL ) 10 MG tablet Take 1 tablet by mouth 3 times daily as needed (PAIN) Indications: Pain FOR PAIN PER SHETH      Magnesium  Oxide 400 MG CAPS Take 1 capsule by mouth daily.      aspirin  325 MG tablet Take 1 tablet by mouth daily      atorvastatin  (LIPITOR ) 80 MG tablet Take 1 tablet by mouth nightly 30 tablet 3    ferrous sulfate  (IRON  325) 325 (65 Fe) MG tablet Take 1 tablet by mouth daily 30 tablet     Multiple Vitamins-Minerals (THERAPEUTIC MULTIVITAMIN-MINERALS) tablet Take 1 tablet by mouth daily      furosemide  (LASIX ) 40 MG tablet furosemide   40 mg tablet   TAKE 1 TABLET BY MOUTH EVERY DAY       No current facility-administered medications on file prior to visit.       Systems Review:  Review of Systems   Constitutional:  Negative for chills and fever.   Respiratory:  Negative for shortness of breath.    Cardiovascular:  Negative for chest pain.   Gastrointestinal:  Negative for abdominal pain, nausea and vomiting.   Skin:  Positive for wound. Negative for color change, pallor and rash.   Hematological:  Negative for adenopathy.       PMH:  Past Medical History:   Diagnosis Date    Anemia     Arthritis     Chronic pain     Legs and Shoulders    Diabetes (HCC)     Exposure to asbestos     History of blood transfusion     History of colon polyps     HTN (hypertension)     Hx of blood clots     DVT    Hyperlipemia     Menopause     Pulmonary nodule     Serum calcium  elevated     Sleep apnea     not using cpap    Stroke (HCC) 12/2021    left sided weakness       Surgical History:  Past Surgical History:   Procedure  Laterality Date    BACK SURGERY N/A 06/24/2022    INCISION AND DRAINAGE /DEBRIDEMENT SACRAL DECUBITUS performed by Reda Garre, DO at [facility] MAIN OR    CESAREAN SECTION  1987    COLONOSCOPY N/A 01/13/2017    COLONOSCOPY performed by Debby Dun, MD at [facility] ENDOSCOPY    COLONOSCOPY      COLONOSCOPY N/A 04/08/2023    COLONOSCOPY DIAGNOSTIC performed by Gerlean Norleen Anes, MD at [facility] ENDOSCOPY    DILATION AND CURETTAGE OF UTERUS  1995    RECTAL SURGERY N/A 12/28/2021    SACRAL WOUND DEBRIDEMENT INCISION AND DRAINAGE performed by Garre Reda, DO at [facility] MAIN OR    SPINE SURGERY N/A 12/09/2021    CERVICAL THREE/FOUR/FIVE/SIX LAMINECTOMY FUSION; C-ARM; STRYKER; EXT BONE GROWTH STIM; 23 HR performed by Anes KATHEE March, MD at [facility] MAIN OR       Social History:  Social History     Socioeconomic History    Marital status: Single     Spouse name: None    Number of children: None    Years of education: None    Highest education level: None   Tobacco Use    Smoking status: Former     Current packs/day: 0.00     Types: Cigarettes, Cigars     Quit date: 11/13/2021     Years since quitting: 2.3    Smokeless tobacco: Never   Vaping Use    Vaping status: Never Used   Substance and Sexual Activity    Alcohol use: Not Currently     Comment: occ    Drug use: Never       Family History:  Family History   Problem Relation Age of Onset    Diabetes Other 45        parent, NOS    Cancer Father     Hypertension Other 35        parent,NOS    Heart Disease Other 3        parent, NOS    Lung Disease Father  No visits with results within 3 Month(s) from this visit.   Latest known visit with results is:   Hospital Outpatient Visit on 11/17/2023   Component Date Value Ref Range Status    [facility] Specimen Collection 11/17/2023 Specimens collected/sent to [facility]. Please direct inquiries to [phone]).    Final       CT Lung Screen (Initial/Annual/Baseline)  Narrative: CT CHEST LUNG SCREENING    INDICATION: Lung CT screening study requested  as patient needs established age  criteria, smoking history requirements, or additional risk factor eligibility  requirements. CT screening study requested as patient meets established  criteria.     TECHNIQUE: Low-dose computed tomography acquisition performed according to the  national comprehensive cancer network guidelines space on body mass index. Axial  raw images obtained. Reconstruction on lung windows and soft tissue windows with  additional coronal and sagittally reformatted series. No intravenous contrast  administered for this exam.    COMPARISON:     - None under current patient registration number ([id number]).  - The patient does have another registration number ([id number]).  Under this  different registration, the patient has had a prior scan dated 07/22/21.    FINDINGS:    Lungs:      - Smooth solid ovoid pulmonary nodule in the right middle lobe measuring 0.8 x  0.7 cm. (Series 3 image 130), stable compared to prior study.  - Solid irregular pulmonary nodule with lobulated margins in the left upper lobe  measuring 0.9 x 0.7 cm. (Series 604, image 35), stable compared to prior study.  - No airspace opacities.    Pleura: No effusion.    Lymph Nodes: No adenopathy.    Cardiovascular: Mild coronary artery calcifications noted.    Bones: Degenerative changes noted in the lower spine.    Soft Tissue: Calcification noted in the right upper quadrant along the left  border of the left liver lobe.  Impression: Stable lung nodules.  Benign based on long term stability.    - Lung RADS:  2.  Benign.  - Management:  Low dose screening CT in 12 months.    Electronically signed by Coye Argyle         Physical Exam:  BP (!) 151/67   Pulse 66   Ht 1.499 m (4' 11)   Wt 69.9 kg (154 lb)   BMI 31.10 kg/m  BMI: Body mass index is 31.1 kg/m.    Physical Exam  Vitals reviewed.   Constitutional:       Appearance: Normal appearance.   HENT:      Head: Normocephalic and atraumatic.   Eyes:      Extraocular Movements:  Extraocular movements intact.      Conjunctiva/sclera: Conjunctivae normal.      Pupils: Pupils are equal, round, and reactive to light.   Cardiovascular:      Pulses: Normal pulses.   Pulmonary:      Effort: Pulmonary effort is normal.   Skin:     General: Skin is warm and dry.      Comments: Small, superficial 0.5 cm scabbed wound present on left lower buttock near previous incision from old sacral wound debridement. Small keloid scar present just superior to the wound.    Neurological:      Mental Status: She is alert and oriented to person, place, and time.   Psychiatric:         Mood and Affect: Mood normal.         Behavior: Behavior  normal.         Thought Content: Thought content normal.         Judgment: Judgment normal.         I have reviewed the information entered by the clinical staff and/or patient and verified it as accurate or edited where necessary.      Electronically signed by:    Cory Rama, MPA, PA-C

## 2024-03-22 ENCOUNTER — Encounter: Payer: Self-pay | Admitting: Podiatry

## 2024-03-22 ENCOUNTER — Ambulatory Visit (INDEPENDENT_AMBULATORY_CARE_PROVIDER_SITE_OTHER): Payer: Medicare Other | Admitting: Podiatry

## 2024-03-22 DIAGNOSIS — E1142 Type 2 diabetes mellitus with diabetic polyneuropathy: Secondary | ICD-10-CM

## 2024-03-22 DIAGNOSIS — M79675 Pain in left toe(s): Secondary | ICD-10-CM

## 2024-03-22 DIAGNOSIS — B351 Tinea unguium: Secondary | ICD-10-CM | POA: Diagnosis not present

## 2024-03-22 DIAGNOSIS — M79674 Pain in right toe(s): Secondary | ICD-10-CM | POA: Diagnosis not present

## 2024-03-27 ENCOUNTER — Encounter: Payer: Self-pay | Admitting: Podiatry

## 2024-03-27 NOTE — Progress Notes (Signed)
  Subjective:  Patient ID: Julie Elliott, female    DOB: 1956-01-08,  MRN: [id number]  Julie Elliott presents to clinic today for at risk foot care with history of diabetic neuropathy and painful thick toenails that are difficult to trim. Pain interferes with ambulation. Aggravating factors include wearing enclosed shoe gear. Pain is relieved with periodic professional debridement. She is accompanied by her son, Prentice, on today's visit. Chief Complaint  Patient presents with   [facility]    Rm17 Diabetic foot care/ Dr. Volney last visit Feb. 17, 2025   New problem(s): None.   PCP is D'Mello, Rosalyn, DO.  Allergies  Allergen Reactions   Loratadine-Pseudoephedrine Er Hives    Claritin-D    Review of Systems: Negative except as noted in the HPI.  Objective: No changes noted in today's physical examination. There were no vitals filed for this visit. Julie Elliott is a pleasant 68 y.o. female in NAD. AAO x 3.  Vascular Examination: Capillary refill time immediate b/l. Vascular status intact b/l with palpable DP pulses; faintly palpable PT pulses. Pedal hair absent b/l. No pain with calf compression b/l. Skin temperature gradient WNL b/l. No cyanosis or clubbing noted b/l. No ischemia or gangrene b/l. Nonpitting edema noted BLE.  Neurological Examination: Sensation grossly intact b/l with 10 gram monofilament. Vibratory sensation intact b/l. Pt has subjective symptoms of neuropathy.  Dermatological Examination: Pedal skin with normal turgor, texture and tone b/l.  No open wounds. No interdigital macerations.   Toenails 1-5 b/l thick, discolored, elongated with subungual debris and pain on dorsal palpation.   Musculoskeletal Examination: Muscle strength 5/5 to all lower extremity muscle groups bilaterally. Pes planus deformity noted bilateral LE.  Radiographs: None  Assessment/Plan: 1. Pain due to onychomycosis of toenails of both feet   2. Diabetic peripheral neuropathy associated  with type 2 diabetes mellitus (HCC)   Patient was evaluated and treated. All patient's and/or POA's questions/concerns addressed on today's visit. Mycotic toenails 1-5 debrided in length and girth without incident. Continue soft, supportive shoe gear daily. Report any pedal injuries to medical professional. Call office if there are any quesitons/concerns. -Patient/POA to call should there be question/concern in the interim.   Return in about 3 months (around 06/22/2024).  Delon LITTIE Merlin, DPM      [facility] LOCATION: [address], [place] [address]                   Office [phone]   [facility] LOCATION: [address] [place], [place] [address] Office [phone]

## 2024-04-06 ENCOUNTER — Encounter

## 2024-04-08 ENCOUNTER — Encounter

## 2024-04-08 ENCOUNTER — Inpatient Hospital Stay: Admit: 2024-04-08 | Payer: Medicare (Managed Care) | Primary: Physician Assistant

## 2024-04-08 DIAGNOSIS — R531 Weakness: Principal | ICD-10-CM

## 2024-04-08 NOTE — Progress Notes (Signed)
 [facility] - [address]  [address]  [place], [place] [address]  [phone] fax    Plan of Care / Statement of Necessity for Physical Therapy Services     Patient Name: [name] Short DOB: Jun 08, 1956   Medical   Diagnosis: Decreased strength  Loss of balance   Treatment Diagnosis: R26.81  Unsteadiness on feet, R42   Dizziness and giddiness, and M62.81  GENERAL MUSCLE WEAKNESS   Onset Date: July 2025 - fall Start of Care [facility]): 04/08/2024   Referral Source: Ike Olam BIRCH, PA Prior Hospitalization: See medical history   Prior Level of Function: Mod with FWW, DTR/Aid with patient all day   Comorbidities: 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        Subjective Info:     MOI: Had a fall a couple of months ago (July - August) resulting in her getting a referral for PT.   Current Symptoms: States that she has weakness in her legs resulting in decreased ability to get in/out of chair, impaired walking tolerance. Decreased balance and bent over posturing during ambulation due to lower back pain. Core weakness which affects her mobility. Impaired coordination with bilateral LE with right worse than left.   Progression since onset?: Plateau   Current Mobility: FWW  Aggravating & Participation Limitations/Relieving factors: Prolonged walking/standing, decreased efficiency with sit to stands, decreased efficiency with bed mobility. Decreased efficiency with stairs.  Previous Treatment: has had PT in the past  Self Care:  Impaired sleep due to pain, right hand dominant   PLOF: Mod I, aid/Dtr with patient all day  Home- Environment:  3 STE, daughter/aid with patient all day.   Past Med Hx/Surgical Hx: Hx of CVA (2023), DM, Arthritis, HTN, C/C fusion, Hx of sacral surgery for wound.   Allergies: lactose  Motivation: good  Goals: move better at home      Assessment / key information:  Pt is a pleasant 68 y.o. female who presents  to PT with c/o generalized weakness and unsteadiness at feet. Her presentation is further complicated by her hx of a CVA affecting her left side. Pt demonstrates impaired strength into bilateral LE with right worse than left, and impaired UE strength into bicep/triceps which has impacted her ability to support herself with UE during functional mobility. Impaired postural awareness noted as she presents with increased T/S kyphosis. She demonstrates impaired static/dynamic balance during assessment today. She c/o of dizziness particularly after sit to stands or prolonged standing, suggesting potential orthostatic involvement however unable to confirm during evaluation today. Pt's impairments have limited their ability to decreased walking and standing tolerance affecting her ability to participate in community activities, decreased efficiency with ambulation at home resulting in falls, decreased efficiency with stair negotiation; over limited functional mobility has impacted her ability to be independent with IADLs. Pt would benefit from skilled PT to address above deficits in order to improve their ability to perfrom pain-free unrestricted ADLs/IADLs in order to enhance the Pt's overall quality of life.       Evaluation Complexity:  History:  HIGH Complexity :3+ comorbidities / personal factors will impact the outcome/ POC ; Examination:  MEDIUM Complexity : 3 Standardized tests and measures addressin body structure, function, activity limitation and / or participation in recreation  ;Presentation:  LOW Complexity : Stable, uncomplicated  ;Clinical Decision Making:  Lower Extremity Functional Scale (LEFS) = 21 ; (<  40 Moderate to Severe Dysfunction) = HIGH Complexity   Overall Complexity Rating: MEDIUM  Problem List: pain affecting function, decrease ROM, decrease strength, edema affection function, impaired gait/balance, decrease ADL/functional abilities, decrease activity tolerance, decrease flexibility/joint  mobility, and decrease transfer abilities    Treatment Plan may include any combination of the following: 02889 Therapeutic Exercise, 97112 Neuromuscular Re-Education, 97140 Manual Therapy, 97530 Therapeutic Activity, 97535 Self Care/Home Management, 97014 Electrical Stim unattended / [phone] [facility]), [phone] Electrical Stim attended, and [phone] Gait Training  Patient / Family readiness to learn indicated by: asking questions, trying to perform skills, interest, return verbalization , and return demonstration   Persons(s) to be included in education: patient (P)  Barriers to Learning/Limitations: none  Measures taken if barriers to learning present:   Patient Goal (s):  move better at home  Patient Self Reported Health Status: fair  Rehabilitation Potential: fair    Short Term Goals: To be accomplished in 2 weeks    Pt will be independent with HEP to facilitate carry-over of functional gains made in PT at home & community. (Initial STG)    Eval: Established at eval    2. Pt will be able to improve strength in bilateral biceps/triceps to at least 4+/5 to improve patient's ability to use UE for support during functional mobility at home & community. (Initial LTG)   Eval: Bicep: 4/5 bilaterally; triceps: 4-/5 bilaterally     Long Term Goals: To be accomplished in 5 weeks    Pt will be able to improve strength in Hip flexion, seated hip ER/Adduction; knee extension/flexion to at least 5/5 to improve patient's ability to get in/out of chair with efficiency at home & community. (Initial LTG)   Eval: Hip flexion: 4-/5 bilaterally, ER: right = 4/5, left = 4+/5, Hip adduction: right = 4/5, left = 4+/5; Knee extension: right = 4/5 left = 4+/5, Knee flexion: 4-/5 bilaterally     2. Pt will improve LEFS score to 31 to demonstrate improvement in patient's ability to perform unrestricted ADLs/IADLs at home & community. (Initial LTG)   Eval: 21    3. Pt will report compliance with home HEP at end of care to enhance carry over of  fuctional gains made with skilled therapy services in order to improve quality of life. (Initial LTG)              Eval: Established first HEP at eval    4. Pt will improve TUG score to at least 26 seconds with FWW to demonstrate a decrease in patients falls risk. (Initial LTG)   Eval: 36 with FWW    5. Pt will improve her 5 time sit to stand time to least 23 seconds with UE to demonstrate improvement in her functional LE strength & decrease her risk for falls at home.    Eval: 33 sec with ue support      Frequency / Duration: Patient would benefit from skilled PT 1-2 times per week for 24 VISITS sessions as needed in this certification period.  Goals will be assigned and reassessed every 10 visits/ 30 days per Medicare guidelines    Patient/ Caregiver education and instruction: Diagnosis, prognosis, self care, activity modification, and exercises [x]   Plan of care has been reviewed with PTA    Certification Period: 04/08/24 - 05/06/24    04/08/2024 - 06/07/24  use for Medicare Cert.or Medicaid (primary or secondary) tracking           Fonda Shay, PT  04/08/2024       11:38 AM  ===================================================================  I certify that the above Therapy Services are being furnished while the patient is under my care. I agree with the treatment plan and certify that this therapy is necessary.    No Physician signature required; Signature on POC no longer required for Medicare as of 08/05/23    Physician's Signature:_________________________   DATE:_________   TIME:________                           Ike Olam BIRCH, PA    ** Signature, Date and Time must be completed for valid certification **  Please sign and return to [facility] - [address]  [address]  [place], [place] [address]  [phone] fax

## 2024-04-08 NOTE — Progress Notes (Signed)
 PHYSICAL / OCCUPATIONAL THERAPY - DAILY TREATMENT NOTE    Patient Name: [name] Short    Date: 04/08/2024    DOB: 10-07-1955  Insurance: [place]: [place] / Plan: [place] [employer] [employer] DUAL / Product Type: *No Product type* /      Patient DOB verified Yes     Visit #   Current / Total 1 10   Time   In / Out 1130 1210   Pain   In / Out 8 8   Subjective Functional Status/Changes: See POC     TREATMENT AREA =  Decreased strength  Loss of balance      If an interpreting service is utilized for treatment of this patient, the contents of this document represent the material reviewed with the patient via the interpreter.     OBJECTIVE    40 min [x] Eval - untimed                             Therapeutic Procedures:    Tx Min Billable or 1:1 Min (if diff from [employer]) Procedure, Rationale, Specifics          Details if applicable:              Details if applicable:            Details if applicable:            Details if applicable:            Details if applicable:       [facility] BC Totals Reminder: bill using total billable min of TIMED therapeutic procedures (example: do not include dry needle or estim unattended, both untimed codes, in totals to left)  8-22 min = 1 unit; 23-37 min = 2 units; 38-52 min = 3 units; 53-67 min = 4 units; 68-82 min = 5 units   Total Total     Charge Capture    [x]   Patient Education billed concurrently with other procedures   [x]  Review HEP    []  Progressed/Changed HEP, detail:    []  Other detail:       Objective Information/Functional Measures/Assessment    See POC    Patient will continue to benefit from skilled PT / OT services to modify and progress therapeutic interventions, analyze and address functional mobility deficits, analyze and address ROM deficits, analyze and address strength deficits, analyze and address soft tissue restrictions, analyze and cue for proper movement patterns, analyze and modify for postural abnormalities, analyze and address imbalance/dizziness, and  instruct in home and community integration to address functional deficits and attain remaining goals.    Progress toward goals / Updated goals:  []   See Progress Note/Recertification    See POC    Next PN/ RC due 05/06/24  Auth due pending    PLAN  - Continue Plan of Care    [place], [place]    04/08/2024    1:08 PM  If an interpreting service was utilized for treatment of this patient, the contents of this document represent the material reviewed with the patient via the interpreter.   Future Appointments   Date Time Provider Department Center   04/12/2024 12:00 PM Jeanell Rea HERO, MD [facility]   04/22/2024 12:10 PM Galdo, Jaimie C, PTA [facility]

## 2024-04-12 ENCOUNTER — Ambulatory Visit
Admit: 2024-04-12 | Discharge: 2024-04-12 | Payer: MEDICARE | Attending: Pulmonary Disease | Primary: Physician Assistant

## 2024-04-12 VITALS — BP 168/58 | HR 67 | Temp 97.20000°F | Resp 16 | Wt 155.6 lb

## 2024-04-12 DIAGNOSIS — R918 Other nonspecific abnormal finding of lung field: Principal | ICD-10-CM

## 2024-04-12 NOTE — Progress Notes (Signed)
 [name] Short presents today for   Chief Complaint   Patient presents with    Follow-up     Lung nodules      Results     LDCT done 02/09/24       Is someone accompanying this pt? Daughter    Is the patient using any DME equipment during OV? No    -DME Company NA    Depression Screening:        09/24/2021     3:10 PM   PHQ-9 Questionaire   Little interest or pleasure in doing things 0   Feeling down, depressed, or hopeless 0   PHQ-9 Total Score 0        Data saved with a previous flowsheet row definition       Learning Needs Questionnaire:     No question data found.      Fall Risk:         09/24/2021     3:10 PM   Fall Risk   Do you feel unsteady or are you worried about falling?  yes   2 or more falls in past year? yes   Fall with injury in past year? no        Abuse Screening:         09/24/2021     3:00 PM   AMB Abuse Screening   Do you ever feel afraid of your partner? N   Are you in a relationship with someone who physically or mentally threatens you? N   Is it safe for you to go home? Y         Coordination of Care:    1. Have you been to the ER, urgent care clinic since your last visit? Hospitalized since your last visit? No    2. Have you seen or consulted any other health care providers outside of the [facility] since your last visit? Include any pap smears or colon screening. PCP    Medication list has been update per patient.

## 2024-04-12 NOTE — Patient Instructions (Signed)
 Learning About Lung Cancer Screening  What is screening for lung cancer?     Lung cancer screening is a way to find some lung cancers early, before a person has any symptoms of the cancer.  Lung cancer screening may help those who have the highest risk for lung cancer--people age 66 and older who are or were heavy smokers. For most people, who aren't at increased risk, screening for lung cancer probably isn't helpful.  Screening won't prevent cancer. And it may not find all lung cancers. Lung cancer screening may lower the risk of dying from lung cancer in a small number of people.  How is it done?  Lung cancer screening is done with a low-dose CT (computed tomography) scan. A CT scan uses X-rays, or radiation, to make detailed pictures of your body. Experts recommend that screening be done in medical centers that focus on finding and treating lung cancer.  Who is screening recommended for?  Lung cancer screening is recommended for people age 12 and older who are or were heavy smokers. That means people with a smoking history of at least 20 pack years. A pack year is a way to measure how heavy a smoker you are or were.  To figure out your pack years, multiply how many packs a day on average (assuming 20 cigarettes per pack) you have smoked by how many years you have smoked. For example:  If you smoked 1 pack a day for 20 years, that's 1 times 20. So you have a smoking history of 20 pack years.  If you smoked 2 packs a day for 10 years, that's 2 times 10. So you have a smoking history of 20 pack years.  Experts agree that screening is for people who have a high risk of lung cancer. But experts don't agree on what high risk means. Some say people age 76 or older with at least a 20-pack-year smoking history are high risk. Others say it's people age 77 or older with a 30-pack-year history.  To see if you could benefit from screening, first find out if you are at high risk for lung cancer. Your doctor can help you  decide your lung cancer risk.  What are the risks of screening?  CT screening for lung cancer isn't perfect. It can show an abnormal result when it turns out there wasn't any cancer. This is called a false-positive result. This means you may need more tests to make sure you don't have cancer. These tests can be harmful and cause a lot of worry.  These tests may include more CT scans and invasive testing like a lung biopsy. In a biopsy, the doctor takes a sample of tissue from inside your lung so it can be looked at under a microscope. A biopsy is the only way to tell if you have lung cancer. If the biopsy finds cancer, you and your doctor will have to decide how or whether to treat it.  Some lung cancers found on CT scans are harmless and would not have caused a problem if they had not been found through screening. But because doctors can't tell which ones will turn out to be harmless, most will be treated. This means that you may get treatment--including surgery, radiation, or chemotherapy--that you don't need.  There is a risk of damage to cells or tissue from being exposed to radiation, including the small amounts used in CTs, X-rays, and other medical tests. Over time, exposure to radiation may cause cancer  and other health problems. But in most cases, the risk of getting cancer from being exposed to small amounts of radiation is low. It's not a reason to avoid these tests for most people.  What are the benefits of screening?  Your scan may be normal (negative).  For some people who are at higher risk, screening lowers the chance of dying of lung cancer. How much and how long you smoked helps to determine your risk level. Screening can find some cancers early, when treatment may be more likely to work.  What happens after screening?  The results of your CT scan will be sent to your doctor. Someone from your care team will explain the results of your scan and answer any questions you may have. If you need any  follow-up, he or she will help you understand what to do next.  After a lung cancer screening, you can go back to your usual activities right away.  A lung cancer screening test can't tell if you have lung cancer. If your results are positive, your doctor can't tell whether an abnormal finding is a harmless nodule, cancer, or something else without doing more tests.  What can you do to help prevent lung cancer?  Some lung cancers can't be prevented. But if you smoke, quitting smoking is the best step you can take to prevent lung cancer. If you want to quit, your doctor can recommend medicines or other ways to help.  Follow-up care is a key part of your treatment and safety. Be sure to make and go to all appointments, and call your doctor if you are having problems. It's also a good idea to know your test results and keep a list of the medicines you take.  Where can you learn more?  Go to [employer] and enter Q940 to learn more about Learning About Lung Cancer Screening.  Current as of: May 29, 2023  Content Version: 14.6   2024-2025 [place], [place].   Care instructions adapted under license by [facility]. If you have questions about a medical condition or this instruction, always ask your healthcare professional. Romayne Alderman, [facility], disclaims any warranty or liability for your use of this information.

## 2024-04-12 NOTE — Progress Notes (Signed)
 [name] Short (DOB:  April 20, 1956) is a 68 y.o. female,Established patient, here for evaluation of the following chief complaint(s):  Follow-up (Lung nodules/) and Results (LDCT done 02/09/24)         Assessment & Plan  Lung nodules            Personal history of tobacco use       Orders:    PR VISIT TO DISCUSS LUNG CA SCREEN W LDCT    CT Lung Screen (Initial/Annual/Baseline); Future      Reviewed latest LDCT, nodules are likely benign based on long term stability.  No further indication for dedicated CT chest but will need annual LDCT screen per guidelines.  LDCT ordered.    Return in about 1 year (around 04/12/2025).       Subjective   Pt is a former smoker with incidental note of a lung nodule on a CT of the abdomen in April 2022. Dedicated CT done in May confirmed R lung nodule with other small nodules. Follow up CT Dec 2022 showed stable nodules, see below. LDCT done 02/09/2024 below shows stable nodules dating back to 2022.    Pt denies shortness of breath or wheezing. Per pt's daughter, pt has had a cough with tenacious clear phlegm for years which resolved since a prior visit. She has no chest pain or hemoptysis. No new respiratory symptoms.    Pt has a 34 PY smoking history and quit smoking in 2023.          Review of Systems   Constitutional:  Negative for activity change, appetite change, chills, diaphoresis, fatigue, fever and unexpected weight change.   HENT:  Negative for congestion, facial swelling, hearing loss, sneezing, sore throat, tinnitus, trouble swallowing and voice change.    Eyes:  Negative for photophobia, pain, discharge, redness, itching and visual disturbance.   Respiratory:  Negative for apnea, cough, choking, chest tightness, shortness of breath, wheezing and stridor.    Cardiovascular:  Negative for chest pain, palpitations and leg swelling.   Gastrointestinal:  Negative for abdominal pain, blood in stool, nausea and vomiting.   Endocrine: Negative for cold intolerance, heat intolerance,  polydipsia, polyphagia and polyuria.   Genitourinary:  Negative for dysuria, frequency and urgency.   Musculoskeletal:  Positive for arthralgias (B knees), back pain and neck pain. Negative for joint swelling and myalgias.   Skin:  Negative for color change, pallor, rash and wound.   Allergic/Immunologic: Negative for environmental allergies, food allergies and immunocompromised state.   Neurological:  Negative for tremors, seizures, syncope, weakness and headaches.   Hematological:  Negative for adenopathy. Does not bruise/bleed easily.   Psychiatric/Behavioral:  Negative for confusion, hallucinations, self-injury, sleep disturbance and suicidal ideas. The patient is not nervous/anxious.         Past Medical History:   Diagnosis Date    Anemia     Arthritis     Chronic pain     Legs and Shoulders    Diabetes (HCC)     Exposure to asbestos     History of blood transfusion     History of colon polyps     HTN (hypertension)     Hx of blood clots     DVT    Hyperlipemia     Menopause     Pulmonary nodule     Serum calcium  elevated     Sleep apnea     not using cpap    Stroke (HCC) 12/2021    left sided  weakness     Past Surgical History:   Procedure Laterality Date    BACK SURGERY N/A 06/24/2022    INCISION AND DRAINAGE /DEBRIDEMENT SACRAL DECUBITUS performed by Reda Garre, DO at [facility] MAIN OR    CESAREAN SECTION  1987    COLONOSCOPY N/A 01/13/2017    COLONOSCOPY performed by Debby Dun, MD at [facility] ENDOSCOPY    COLONOSCOPY      COLONOSCOPY N/A 04/08/2023    COLONOSCOPY DIAGNOSTIC performed by Gerlean Norleen Anes, MD at [facility] ENDOSCOPY    DILATION AND CURETTAGE OF UTERUS  1995    RECTAL SURGERY N/A 12/28/2021    SACRAL WOUND DEBRIDEMENT INCISION AND DRAINAGE performed by Garre Reda, DO at [facility] MAIN OR    SPINE SURGERY N/A 12/09/2021    CERVICAL THREE/FOUR/FIVE/SIX LAMINECTOMY FUSION; C-ARM; STRYKER; EXT BONE GROWTH STIM; 23 HR performed by Anes KATHEE March, MD at [facility] MAIN OR     Current Outpatient Medications on File  Prior to Visit   Medication Sig Dispense Refill    Probiotic Product (PROBIOTIC BLEND PO) Take by mouth Daily      famotidine  (PEPCID ) 20 MG tablet TAKE 1 TABLET BY MOUTH ONCE A DAY FOR GERD      vitamin C (ASCORBIC ACID) 500 MG tablet Take 1 tablet by mouth daily      insulin  glargine (LANTUS ) 100 UNIT/ML injection vial Inject 10 Units into the skin daily 1 each 0    insulin  lispro (HUMALOG ) 100 UNIT/ML SOLN injection vial Inject 3 Units into the skin 3 times daily (with meals) 1 each 0    hydrALAZINE  (APRESOLINE ) 25 MG tablet Take 1 tablet by mouth every 8 hours 90 tablet 3    potassium chloride  (KLOR-CON  M) 10 MEQ extended release tablet Take 1 tablet by mouth daily      cinacalcet  (SENSIPAR ) 30 MG tablet Take 1 tablet by mouth 2 times daily      carvedilol  (COREG ) 6.25 MG tablet Take 1 tablet by mouth 2 times daily (with meals) 60 tablet 3    baclofen  (LIORESAL ) 10 MG tablet Take 1 tablet by mouth 3 times daily as needed (PAIN) Indications: Pain FOR PAIN PER SHETH      Magnesium  Oxide 400 MG CAPS Take 1 capsule by mouth daily.      aspirin  325 MG tablet Take 1 tablet by mouth daily      atorvastatin  (LIPITOR ) 80 MG tablet Take 1 tablet by mouth nightly 30 tablet 3    ferrous sulfate  (IRON  325) 325 (65 Fe) MG tablet Take 1 tablet by mouth daily 30 tablet     furosemide  (LASIX ) 40 MG tablet furosemide  40 mg tablet   TAKE 1 TABLET BY MOUTH EVERY DAY      Multiple Vitamins-Minerals (THERAPEUTIC MULTIVITAMIN-MINERALS) tablet Take 1 tablet by mouth daily       No current facility-administered medications on file prior to visit.     Allergies   Allergen Reactions    Lactose Other (See Comments)     Intolerance     Family History   Problem Relation Age of Onset    Diabetes Other 41        parent, NOS    Cancer Father     Hypertension Other 35        parent,NOS    Heart Disease Other 77        parent, NOS    Lung Disease Father      Social History  Socioeconomic History    Marital status: Single     Spouse name: Not  on file    Number of children: Not on file    Years of education: Not on file    Highest education level: Not on file   Occupational History    Not on file   Tobacco Use    Smoking status: Former     Current packs/day: 1.00     Average packs/day: 1 pack/day for 34.0 years (34.0 ttl pk-yrs)     Types: Cigarettes, Cigars     Start date: 04/12/1990    Smokeless tobacco: Never   Vaping Use    Vaping status: Never Used   Substance and Sexual Activity    Alcohol use: Not Currently     Comment: occ    Drug use: Never    Sexual activity: Not on file   Other Topics Concern    Not on file   Social History Narrative    Not on file     Social Drivers of Health     Financial Resource Strain: Not on file   Food Insecurity: Not on file   Transportation Needs: Not on file   Physical Activity: Not on file   Stress: Not on file   Social Connections: Not on file   Intimate Partner Violence: Not on file   Housing Stability: Not on file       Objective   Blood pressure (!) 168/58, pulse 67, temperature 97.2 F (36.2 C), temperature source Temporal, resp. rate 16, weight 70.6 kg (155 lb 9.6 oz), SpO2 96%.   Physical Exam  Constitutional:       General: She is not in acute distress.     Appearance: She is not ill-appearing, toxic-appearing or diaphoretic.      Comments: Walker use   HENT:      Head: Normocephalic and atraumatic.      Right Ear: External ear normal.      Left Ear: External ear normal.      Nose: Nose normal. No congestion.      Mouth/Throat:      Mouth: Mucous membranes are moist.      Pharynx: Oropharynx is clear. No oropharyngeal exudate or posterior oropharyngeal erythema.   Eyes:      General: No scleral icterus.        Right eye: No discharge.         Left eye: No discharge.      Conjunctiva/sclera: Conjunctivae normal.      Pupils: Pupils are equal, round, and reactive to light.   Neck:      Vascular: No carotid bruit.      Comments: Surgical scar posterior base of neck  Cardiovascular:      Rate and Rhythm: Normal  rate and regular rhythm.      Pulses: Normal pulses.      Heart sounds: Normal heart sounds. No murmur heard.     No gallop.   Pulmonary:      Effort: Pulmonary effort is normal. No respiratory distress.      Breath sounds: Normal breath sounds. No stridor. No wheezing, rhonchi or rales.   Chest:      Chest wall: No tenderness.   Abdominal:      Palpations: Abdomen is soft. There is no mass.      Tenderness: There is no abdominal tenderness.   Musculoskeletal:         General: No swelling, tenderness, deformity or  signs of injury.      Cervical back: No rigidity or tenderness.      Right lower leg: No edema.      Left lower leg: No edema.   Lymphadenopathy:      Cervical: No cervical adenopathy.   Skin:     General: Skin is warm and dry.      Coloration: Skin is not jaundiced or pale.      Findings: No bruising, erythema, lesion or rash.   Neurological:      General: No focal deficit present.      Mental Status: She is alert and oriented to person, place, and time.      Gait: Gait normal.             CT Result (most recent):  CT LUNG SCREENING (INITIAL/ANNUAL) 02/09/2024    Narrative  CT CHEST LUNG SCREENING    INDICATION: Lung CT screening study requested as patient needs established age  criteria, smoking history requirements, or additional risk factor eligibility  requirements. CT screening study requested as patient meets established  criteria.    TECHNIQUE: Low-dose computed tomography acquisition performed according to the  national comprehensive cancer network guidelines space on body mass index. Axial  raw images obtained. Reconstruction on lung windows and soft tissue windows with  additional coronal and sagittally reformatted series. No intravenous contrast  administered for this exam.    COMPARISON:    - None under current patient registration number ([id number]).  - The patient does have another registration number ([id number]).  Under this  different registration, the patient has had a prior scan dated  07/22/21.    FINDINGS:    Lungs:    - Smooth solid ovoid pulmonary nodule in the right middle lobe measuring 0.8 x  0.7 cm. (Series 3 image 130), stable compared to prior study.  - Solid irregular pulmonary nodule with lobulated margins in the left upper lobe  measuring 0.9 x 0.7 cm. (Series 604, image 35), stable compared to prior study.  - No airspace opacities.    Pleura: No effusion.    Lymph Nodes: No adenopathy.    Cardiovascular: Mild coronary artery calcifications noted.    Bones: Degenerative changes noted in the lower spine.    Soft Tissue: Calcification noted in the right upper quadrant along the left  border of the left liver lobe.    Impression  Stable lung nodules.  Benign based on long term stability.    - Lung RADS:  2.  Benign.  - Management:  Low dose screening CT in 12 months.    Electronically signed by Coye Argyle            CT CHEST WO CONTRAST  Order: [id number]  Status: Final result       Visible to patient: Yes (seen)       Next appt: Today at 10:45 AM in Pulmonology Marinda CHRISTELLA Drafts, MD)       Dx: Right upper lobe pulmonary nodule    0 Result Notes       1 HM Topic  Details    Reading Physician Reading Date Result Priority   Jolee Elsie CROME, MD  [phone] 12/03/2022    Arlyss Lang BIRCH, MD  [phone] 12/03/2022      Narrative & Impression  EXAM: CT CHEST WO CONTRAST.     CLINICAL INDICATION/HISTORY: Solitary pulmonary nodule.     COMPARISON: CT abdomen pelvis 10/05/2022. CT chest 07/22/2021. CT chest 12/11/2020.  TECHNIQUE: Unenhanced CT imaging of the chest was performed, after which  sagittal and coronal reformats were rendered from axial source images.     One or more dose reduction techniques were used on this CT: automated exposure  control, adjustment of the mAs and/or kVp according to patient size, and  iterative reconstruction techniques. The specific techniques used on this CT  exam have been documented in the patient's electronic medical record. Digital  Imaging and Communications in  Medicine (DICOM) format image data are available  to nonaffiliated external healthcare facilities or entities on a secure, media  free, reciprocally searchable basis with patient authorization for at least a  33-month period after this study.  _______________        FINDINGS:     Lungs/Pleura: There is no focal airspace consolidation, pneumothorax, or pleural  effusion. The 1 cm right lower lobe nodule noted on recent CT abdomen pelvis is  no longer present. Redemonstrated 7 mm nodule in the right middle lobe, stable  since May 2022. Redemonstrated 8 mm nodule in the left upper lobe is stable  since May 2022. No new suspicious pulmonary nodules or masses.     Central Airways: Patent.      Vessels: Aortic atherosclerotic changes without aneurysm.     Heart/pericardium: Heart size is normal. No pericardial effusion. Coronary  artery calcifications are present.     Mediastinum/hila: Within normal limits.     Chest wall/lower neck: Within normal limits.     Axilla: Within normal limits.     Upper abdomen: Limited visualization of the upper abdominal contents reveals no  significant acute findings.     Osseous structures: Degenerative changes with no acute or suspicious osseous  findings.     _______________     IMPRESSION:        1. Interval resolution of 1 cm right lower lobe nodule noted on CT abdomen  pelvis in March 2024.     2. 7 mm right middle lobe and 8 mm left upper lobe nodules have been stable  since May 2022, very likely benign, no further follow-up is needed.         Jacob Graham MD  [facility]     All findings discussed with attending radiologist. I have personally reviewed  all images and agree with the interpretation above.              Exam Ended: 12/02/22 15:32 EDT Last Resulted: 12/03/22 11:38 EDT                           An electronic signature was used to authenticate this note.    --Rea CHRISTELLA Drafts, MD     Discussed with the patient the current USPSTF guidelines released October 11, 2019  for screening for lung cancer.    For adults aged 58 to 35 years who have a 20 pack-year smoking history and currently smoke or have quit within the past 15 years the grade B recommendation is to:  Screen for lung cancer with low-dose computed tomography (LDCT) every year.  Stop screening once a person has not smoked for 15 years or has a health problem that limits life expectancy or the ability to have lung surgery.    The patient  reports that she has quit smoking. Her smoking use included cigarettes and cigars. She started smoking about 34 years ago. She has a 34 pack-year smoking history. She has never used smokeless  tobacco.. Discussed with patient the risks and benefits of screening, including over-diagnosis, false positive rate, and total radiation exposure.  The patient currently exhibits no signs or symptoms suggestive of lung cancer.  Discussed with patient the importance of compliance with yearly annual lung cancer screenings and willingness to undergo diagnosis and treatment if screening scan is positive.  In addition, the patient was counseled regarding the importance of remaining smoke free and/or total smoking cessation.    Also reviewed the following if the patient has Medicare that as of September 13, 2020, Medicare only covers LDCT screening in patients aged 50-77 with at least a 20 pack-year smoking history who currently smoke or have quit in the last 15 years

## 2024-04-18 ENCOUNTER — Ambulatory Visit: Payer: Medicare (Managed Care) | Primary: Physician Assistant

## 2024-04-18 ENCOUNTER — Inpatient Hospital Stay: Admit: 2024-04-18 | Payer: Medicare (Managed Care) | Primary: Physician Assistant

## 2024-04-18 DIAGNOSIS — M542 Cervicalgia: Secondary | ICD-10-CM

## 2024-04-18 DIAGNOSIS — M5030 Other cervical disc degeneration, unspecified cervical region: Secondary | ICD-10-CM

## 2024-04-22 ENCOUNTER — Inpatient Hospital Stay: Admit: 2024-04-22 | Payer: Medicare (Managed Care) | Primary: Physician Assistant

## 2024-04-22 NOTE — Progress Notes (Signed)
 PHYSICAL / OCCUPATIONAL THERAPY - DAILY TREATMENT NOTE    Patient Name: [name] Short    Date: 04/22/2024    DOB: 29-Jan-1956  Ins[place]: [place] / Plan: [place] [employer] [place] [employer] [employer] [place] / Product Type: *No Product type* /      Patient DOB verified Yes     Visit #   Current / Total 2 10   Time   In / Out 1210 1250   Pain   In / Out 6 6   Subjective Functional Status/Changes: Pt reports pain in AM hours is the worst.     TREATMENT AREA =  Decreased strength  Loss of balance    OBJECTIVE         Therapeutic Procedures:    Tx Min Billable or 1:1 Min (if diff from Tx Min) Procedure, Rationale, Specifics   15 15 97110 Therapeutic Exercise (timed):  increase ROM, strength, coordination, balance, and proprioception to improve patient's ability to progress to PLOF and address remaining functional goals. (see flow sheet as applicable)     Details if applicable:       15 15 97112 Neuromuscular Re-Education (timed):  improve balance, coordination, kinesthetic sense, posture, core stability and proprioception to improve patient's ability to develop conscious control of individual muscles and awareness of position of extremities in order to progress to PLOF and address remaining functional goals. (see flow sheet as applicable)     Details if applicable:     11 11 97530 Therapeutic Activity (timed):  use of dynamic activities replicating functional movements to increase ROM, strength, coordination, balance, and proprioception in order to improve patient's ability to progress to PLOF and address remaining functional goals.  (see flow sheet as applicable)     Details if applicable:            Details if applicable:            Details if applicable:     41 41 MC BC Totals Reminder: bill using total billable min of TIMED therapeutic procedures (example: do not include dry needle or estim unattended, both untimed codes, in totals to left)  8-22 min = 1 unit; 23-37 min = 2 units; 38-52 min = 3 units; 53-67 min = 4  units; 68-82 min = 5 units   Total Total     Charge Capture    [x]   Patient Education billed concurrently with other procedures   [x]  Review HEP    []  Progressed/Changed HEP, detail:    []  Other detail:       Objective Information/Functional Measures/Assessment    Pt arrives for first f/u from IE. Initiated exercise program per POC without adverse effects. Pt requires initial cuing for all exercises but good carryover with reps.Instructed Pt with step ups and she was able to perform task with fair return.Frequent verbal and tactile cues needed to encourage Pt to stand up right due to severe kyphotic posture.Reviewed HEP and Pt was able to perform demo.Progress as able.    Patient will continue to benefit from skilled PT / OT services to modify and progress therapeutic interventions, analyze and address functional mobility deficits, analyze and address ROM deficits, analyze and address strength deficits, analyze and address soft tissue restrictions, analyze and cue for proper movement patterns, analyze and modify for postural abnormalities, analyze and address imbalance/dizziness, and instruct in home and community integration to address functional deficits and attain remaining goals.    Progress toward goals / Updated goals:  []   See Progress Note/Recertification    Short Term Goals: To be accomplished in 2 weeks     Pt will be independent with HEP to facilitate carry-over of functional gains made in PT at home & community. (Initial STG)              Eval: Established at eval    Pt performs HEP 3x per week 04/22/24    2. Pt will be able to improve strength in bilateral biceps/triceps to at least 4+/5 to improve patient's ability to use UE for support during functional mobility at home & community. (Initial LTG)              Eval: Bicep: 4/5 bilaterally; triceps: 4-/5 bilaterally      Long Term Goals: To be accomplished in 5 weeks     Pt will be able to improve strength in Hip flexion, seated hip ER/Adduction; knee  extension/flexion to at least 5/5 to improve patient's ability to get in/out of chair with efficiency at home & community. (Initial LTG)              Eval: Hip flexion: 4-/5 bilaterally, ER: right = 4/5, left = 4+/5, Hip adduction: right = 4/5, left = 4+/5; Knee extension: right = 4/5 left = 4+/5, Knee flexion: 4-/5 bilaterally      2. Pt will improve LEFS score to 31 to demonstrate improvement in patient's ability to perform unrestricted ADLs/IADLs at home & community. (Initial LTG)              Eval: 21     3. Pt will report compliance with home HEP at end of care to enhance carry over of fuctional gains made with skilled therapy services in order to improve quality of life. (Initial LTG)              Eval: Established first HEP at eval     4. Pt will improve TUG score to at least 26 seconds with FWW to demonstrate a decrease in patients falls risk. (Initial LTG)              Eval: 36 with FWW     5. Pt will improve her 5 time sit to stand time to least 23 seconds with UE to demonstrate improvement in her functional LE strength & decrease her risk for falls at home.               Eval: 33 sec with ue support    Next PN 05/06/24  RC or Medicaid tracking due 05/06/24  Auth due (visit number/ date) 22 visits till 07/3124    PLAN  - Continue Plan of Care    Ozell Low, PTA    04/22/2024    12:18 PM  If an interpreting service was utilized for treatment of this patient, the contents of this document represent the material reviewed with the patient via the interpreter.     No future appointments.

## 2024-04-26 ENCOUNTER — Inpatient Hospital Stay: Admit: 2024-04-26 | Payer: Medicare (Managed Care) | Primary: Physician Assistant

## 2024-04-26 NOTE — Progress Notes (Signed)
 PHYSICAL / OCCUPATIONAL THERAPY - DAILY TREATMENT NOTE    Patient Name: [name] Short    Date: 04/26/2024    DOB: 1955/11/04  Insurance: [place] / Plan: [employer] [employer] [place] [employer] [place] / Product Type: *No Product type* /      Patient DOB verified Yes     Visit #   Current / Total 3 10   Time   In / Out 1214 1256   Pain   In / Out 6 6   Subjective Functional Status/Changes: I'm hurting this morning.     TREATMENT AREA =  Decreased strength  Loss of balance    OBJECTIVE         Therapeutic Procedures:    Tx Min Billable or 1:1 Min (if diff from Tx Min) Procedure, Rationale, Specifics   20 20 97110 Therapeutic Exercise (timed):  increase ROM, strength, coordination, balance, and proprioception to improve patient's ability to progress to PLOF and address remaining functional goals. (see flow sheet as applicable)     Details if applicable:       12 12 97112 Neuromuscular Re-Education (timed):  improve balance, coordination, kinesthetic sense, posture, core stability and proprioception to improve patient's ability to develop conscious control of individual muscles and awareness of position of extremities in order to progress to PLOF and address remaining functional goals. (see flow sheet as applicable)     Details if applicable:     10 10 97530 Therapeutic Activity (timed):  use of dynamic activities replicating functional movements to increase ROM, strength, coordination, balance, and proprioception in order to improve patient's ability to progress to PLOF and address remaining functional goals.  (see flow sheet as applicable)     Details if applicable:     42 42 MC BC Totals Reminder: bill using total billable min of TIMED therapeutic procedures (example: do not include dry needle or estim unattended, both untimed codes, in totals to left)  8-22 min = 1 unit; 23-37 min = 2 units; 38-52 min = 3 units; 53-67 min = 4 units; 68-82 min = 5 units   Total Total     Charge Capture    [x]   Patient  Education billed concurrently with other procedures   [x]  Review HEP    []  Progressed/Changed HEP, detail:    []  Other detail:       Objective Information/Functional Measures/Assessment    Pt continues to ambulate with downward gaze, shuffled feet, decreased step/stride length and FWW. Pt required min A throughout for sit to stand transfers from lower surface, but from higher surface like the nustep she was able to rise with SBA. Pt challenged with balance activities especially with eyes closed. Continue to progress as able.     Patient will continue to benefit from skilled PT / OT services to modify and progress therapeutic interventions, analyze and address functional mobility deficits, analyze and address ROM deficits, analyze and address strength deficits, analyze and address soft tissue restrictions, analyze and cue for proper movement patterns, analyze and modify for postural abnormalities, analyze and address imbalance/dizziness, and instruct in home and community integration to address functional deficits and attain remaining goals.    Progress toward goals / Updated goals:  []   See Progress Note/Recertification    Short Term Goals: To be accomplished in 2 weeks     Pt will be independent with HEP to facilitate carry-over of functional gains made in PT at home & community. (Initial STG)  Eval: Established at eval               Pt performs HEP 3x per week 04/22/24     2. Pt will be able to improve strength in bilateral biceps/triceps to at least 4+/5 to improve patient's ability to use UE for support during functional mobility at home & community. (Initial LTG)              Eval: Bicep: 4/5 bilaterally; triceps: 4-/5 bilaterally       Long Term Goals: To be accomplished in 5 weeks     Pt will be able to improve strength in Hip flexion, seated hip ER/Adduction; knee extension/flexion to at least 5/5 to improve patient's ability to get in/out of chair with efficiency at home & community. (Initial  LTG)              Eval: Hip flexion: 4-/5 bilaterally, ER: right = 4/5, left = 4+/5, Hip adduction: right = 4/5, left = 4+/5; Knee extension: right = 4/5 left = 4+/5, Knee flexion: 4-/5 bilaterally      2. Pt will improve LEFS score to 31 to demonstrate improvement in patient's ability to perform unrestricted ADLs/IADLs at home & community. (Initial LTG)              Eval: 21     3. Pt will report compliance with home HEP at end of care to enhance carry over of fuctional gains made with skilled therapy services in order to improve quality of life. (Initial LTG)              Eval: Established first HEP at eval     4. Pt will improve TUG score to at least 26 seconds with FWW to demonstrate a decrease in patients falls risk. (Initial LTG)              Eval: 36 with FWW      5. Pt will improve her 5 time sit to stand time to least 23 seconds with UE to demonstrate improvement in her functional LE strength & decrease her risk for falls at home.               Eval: 33 sec with ue support   Remains high falls risk per safety awareness with sit to stands [Date assessed: 04/26/2024]    Next PN 05/07/2024  RC or Medicaid tracking due 05/07/2024  Auth due (visit number/ date) 21 visits remaining by 08/03/2024    PLAN  - Continue Plan of Care    Dorise Ni, PTA, CSCS    04/26/2024    10:14 AM  If an interpreting service was utilized for treatment of this patient, the contents of this document represent the material reviewed with the patient via the interpreter.     Future Appointments   Date Time Provider Department Center   04/26/2024 12:10 PM Ni Dorise, PT [facility]

## 2024-04-29 ENCOUNTER — Inpatient Hospital Stay: Admit: 2024-04-29 | Payer: Medicare (Managed Care) | Primary: Physician Assistant

## 2024-04-29 NOTE — Progress Notes (Signed)
 PHYSICAL  DAILY TREATMENT NOTE     Patient Name: [name] Short    Date: 04/29/2024    DOB: 08-23-1955  Insurance: [place] / Plan: [employer] [employer] [place] [employer] / Product Type: *No Product type* /      Patient DOB verified Yes     Visit #   Current / Total 4 10   Time   In / Out 1211 1252   Pain   In / Out 6/10 6/10   Subjective Functional Status/Changes: Pt reports she hasn't been able to do all exercises bc she is busy c her hand exercises      Next PN 05/07/2024  RC or Medicaid tracking due 05/07/2024  Auth due (visit number/ date) 21 visits remaining by 08/03/2024    TREATMENT AREA =  Decreased strength  Loss of balance    OBJECTIVE      Therapeutic Procedures:    Tx Min Billable or 1:1 Min (if diff from Tx Min) Procedure, Rationale, Specifics   19  97110 Therapeutic Exercise (timed):  increase ROM, strength, coordination, balance, and proprioception to improve patient's ability to progress to PLOF and address remaining functional goals. (see flow sheet as applicable)     Details if applicable:       10  97112 Neuromuscular Re-Education (timed):  improve balance, coordination, kinesthetic sense, posture, core stability and proprioception to improve patient's ability to develop conscious control of individual muscles and awareness of position of extremities in order to progress to PLOF and address remaining functional goals. (see flow sheet as applicable)     Details if applicable:     12  97530 Therapeutic Activity (timed):  use of dynamic activities replicating functional movements to increase ROM, strength, coordination, balance, and proprioception in order to improve patient's ability to progress to PLOF and address remaining functional goals.  (see flow sheet as applicable)     Details if applicable:     41 41 MC BC Totals Reminder: bill using total billable min of TIMED therapeutic procedures (example: do not include dry needle or estim unattended, both untimed codes, in totals to  left)  8-22 min = 1 unit; 23-37 min = 2 units; 38-52 min = 3 units; 53-67 min = 4 units; 68-82 min = 5 units   Total Total     Charge Capture    [x]   Patient Education billed concurrently with other procedures   [x]  Review HEP    []  Progressed/Changed HEP, detail:    [x]  Other detail: Pt ed on the benefits and importance of compliance with HEP      Objective Information/Functional Measures/Assessment    Required min/Mod VCs + demo to perform proper technique and for safety with TE  Pt demonstrated minimal apprehension during movements without c/o increase p!    Ccs/ Vcs to engage core and keep upright posture     Required 2 seated rest breaks,a ble to perform seated exercises during rest    Increase time to perform all exercises for transfers, and gait    Patient will continue to benefit from skilled PT / OT services to modify and progress therapeutic interventions, analyze and address functional mobility deficits, analyze and address ROM deficits, analyze and address strength deficits, analyze and address soft tissue restrictions, analyze and cue for proper movement patterns, analyze and modify for postural abnormalities, and instruct in home and community integration to address functional deficits and attain remaining goals.    Progress toward goals /  Updated goals:  []   See Progress Note/Recertification    Short Term Goals: To be accomplished in 2 weeks     Pt will be independent with HEP to facilitate carry-over of functional gains made in PT at home & community. (Initial STG) progressing, min VCing              Eval: Established at eval     2. Pt will be able to improve strength in bilateral biceps/triceps to at least 4+/5 to improve patient's ability to use UE for support during functional mobility at home & community. (Initial LTG) addressed c strengthening              Eval: Bicep: 4/5 bilaterally; triceps: 4-/5 bilaterally      Long Term Goals: To be accomplished in 5 weeks     Pt will be able to improve  strength in Hip flexion, seated hip ER/Adduction; knee extension/flexion to at least 5/5 to improve patient's ability to get in/out of chair with efficiency at home & community. (Initial LTG)               Eval: Hip flexion: 4-/5 bilaterally, ER: right = 4/5, left = 4+/5, Hip adduction: right = 4/5, left = 4+/5; Knee extension: right = 4/5 left = 4+/5, Knee flexion: 4-/5 bilaterally      2. Pt will improve LEFS score to 31 to demonstrate improvement in patient's ability to perform unrestricted ADLs/IADLs at home & community. (Initial LTG)              Eval: 21     3. Pt will report compliance with home HEP at end of care to enhance carry over of fuctional gains made with skilled therapy services in order to improve quality of life. (Initial LTG) progressing, intermittent compliance              Eval: Established first HEP at eval     4. Pt will improve TUG score to at least 26 seconds with FWW to demonstrate a decrease in patients falls risk. (Initial LTG)              Eval: 36 with FWW     5. Pt will improve her 5 time sit to stand time to least 23 seconds with UE to demonstrate improvement in her functional LE strength & decrease her risk for falls at home.               Eval: 33 sec with ue support      PLAN  Yes  Continue plan of care  [x]   Upgrade activities as tolerated  []   Discharge due to :  []   Other:    Caprice Harries, PTA    04/29/2024    12:16 PM  If an interpreting service was utilized for treatment of this patient, the contents of this document represent the material reviewed with the patient via the interpreter.     Future Appointments   Date Time Provider Department Center   05/03/2024  1:30 PM Galdo, Jaimie C, PTA [facility]   05/06/2024 12:10 PM Maryjane Pride, PT [facility]

## 2024-05-03 ENCOUNTER — Inpatient Hospital Stay: Admit: 2024-05-03 | Payer: Medicare (Managed Care) | Primary: Physician Assistant

## 2024-05-03 NOTE — Progress Notes (Signed)
 PHYSICAL / OCCUPATIONAL THERAPY - DAILY TREATMENT NOTE    Patient Name: [name] Short    Date: 05/03/2024    DOB: [birth year]  Insurance: [place] / Plan: [employer] [employer] [place] [employer] / Product Type: *No Product type* /      Patient DOB verified Yes     Visit #   Current / Total 5 10   Time   In / Out 133 213   Pain   In / Out 6 6   Subjective Functional Status/Changes: Pt states, My butt hurts. Reports she slid off her couch onto her butt this morning.      TREATMENT AREA =  Decreased strength  Loss of balance    OBJECTIVE    Therapeutic Procedures:    Tx Min Billable or 1:1 Min (if diff from Tx Min) Procedure, Rationale, Specifics   12  97110 Therapeutic Exercise (timed):  increase ROM, strength, coordination, balance, and proprioception to improve patient's ability to progress to PLOF and address remaining functional goals. (see flow sheet as applicable)     Details if applicable:       8  97112 Neuromuscular Re-Education (timed):  improve balance, coordination, kinesthetic sense, posture, core stability and proprioception to improve patient's ability to develop conscious control of individual muscles and awareness of position of extremities in order to progress to PLOF and address remaining functional goals. (see flow sheet as applicable)     Details if applicable:  scap/hip re-ed   20  97530 Therapeutic Activity (timed):  use of dynamic activities replicating functional movements to increase ROM, strength, coordination, balance, and proprioception in order to improve patient's ability to progress to PLOF and address remaining functional goals.  (see flow sheet as applicable)     Details if applicable:  standing functional hip strength, STS   40  MC BC Totals Reminder: bill using total billable min of TIMED therapeutic procedures (example: do not include dry needle or estim unattended, both untimed codes, in totals to left)  8-22 min = 1 unit; 23-37 min = 2 units; 38-52 min = 3 units;  53-67 min = 4 units; 68-82 min = 5 units   Total Total     Charge Capture    [x]   Patient Education billed concurrently with other procedures   [x]  Review HEP    []  Progressed/Changed HEP, detail:    []  Other detail:       Objective Information/Functional Measures/Assessment    Patient reports buttocks pain after sliding off her couch onto her butt this morning, denies hitting her head or other injury. Educated on appropriate mechanics for STS and for safe sitting on her furniture at home. Demonstrates limitations with hip strength with reduced AROM knee flexion in standing and increased 5XSTS time. Addressed with TB hamstring curls in sitting and elevated STS from plinth. She is due for reassessment at [facility] with plans to continue.    Patient will continue to benefit from skilled PT / OT services to modify and progress therapeutic interventions, analyze and address functional mobility deficits, analyze and address ROM deficits, analyze and address strength deficits, analyze and address soft tissue restrictions, analyze and cue for proper movement patterns, analyze and modify for postural abnormalities, analyze and address imbalance/dizziness, and instruct in home and community integration to address functional deficits and attain remaining goals.    Progress toward goals / Updated goals:  []   See Progress Note/Recertification    Short Term Goals: To be accomplished  in 2 weeks  Pt will be independent with HEP to facilitate carry-over of functional gains made in PT at home & community. (Initial STG)               Eval: Established at eval   Current: progressing, intermittent compliance     2. Pt will be able to improve strength in bilateral biceps/triceps to at least 4+/5 to improve patient's ability to use UE for support during functional mobility at home & community. (Initial LTG)               Eval: Bicep: 4/5 bilaterally; triceps: 4-/5 bilaterally    Current: biceps: 4+/5 B, triceps: 4+/5 B     Long Term Goals: To  be accomplished in 5 weeks  Pt will be able to improve strength in Hip flexion, seated hip ER/Adduction; knee extension/flexion to at least 5/5 to improve patient's ability to get in/out of chair with efficiency at home & community. (Initial LTG)               Eval: Hip flexion: 4-/5 bilaterally, ER: right = 4/5, left = 4+/5, Hip adduction: right = 4/5, left = 4+/5; Knee extension: right = 4/5 left = 4+/5, Knee flexion: 4-/5 bilaterally    Assess NV     2. Pt will improve LEFS score to 31 to demonstrate improvement in patient's ability to perform unrestricted ADLs/IADLs at home & community. (Initial LTG)              Eval: 21   Assess NV     3. Pt will report compliance with home HEP at end of care to enhance carry over of fuctional gains made with skilled therapy services in order to improve quality of life. (Initial LTG)               Eval: Established first HEP at eval   Current: progressing, intermittent compliance     4. Pt will improve TUG score to at least 26 seconds with FWW to demonstrate a decrease in patients falls risk. (Initial LTG)              Eval: 36 with FWW     5. Pt will improve her 5 time sit to stand time to least 23 seconds with UE to demonstrate improvement in her functional LE strength & decrease her risk for falls at home.               Eval: 33 sec with ue support   Current: 48 sec with UE support, cuing to scoot forward in seat and improve anterior WS [Date assessed: 05/03/24]    Next PN 05/07/2024  RC or Medicaid tracking due 05/07/2024  Auth due (visit number/ date) 19 visits remaining by 08/03/2024    PLAN  - Continue Plan of Care    Ami JAYSON Furnish, PTA    05/03/2024    1:18 PM  If an interpreting service was utilized for treatment of this patient, the contents of this document represent the material reviewed with the patient via the interpreter.     Future Appointments   Date Time Provider Department Center   05/03/2024  1:30 PM Byrd Rushlow C, PTA [facility]   05/06/2024 12:10 PM  Maysie Parkhill C, PTA [facility]

## 2024-05-06 ENCOUNTER — Inpatient Hospital Stay: Admit: 2024-05-06 | Payer: Medicare (Managed Care) | Primary: Physician Assistant

## 2024-05-06 DIAGNOSIS — R531 Weakness: Principal | ICD-10-CM

## 2024-05-06 NOTE — Progress Notes (Signed)
 "PHYSICAL / OCCUPATIONAL THERAPY - DAILY TREATMENT NOTE    Patient Name: [name] Short    Date: 05/06/2024    DOB: March 18, 1956  Insurance: [place] / Plan: [place] [employer] [employer] D[place] / Product Type: *No Product type* /      Patient DOB verified Yes     Visit #   Current / Total 6 10   Time   In / Out 1218 106   Pain   In / Out 6 6   Subjective Functional Status/Changes: Pt reports she [place] about ten [place] she was at a funeral.      TREATMENT AREA =  Decreased strength  Loss of balance    OBJECTIVE      Therapeutic Procedures:    Tx Min Billable or 1:1 Min (if diff from Tx Min) Procedure, Rationale, Specifics   8  97110 Therapeutic Exercise (timed):  increase ROM, strength, coordination, balance, and proprioception to improve patient's ability to progress to PLOF and address remaining functional goals. (see flow sheet as applicable)     Details if applicable:       30  97530 Therapeutic Activity (timed):  use of dynamic activities replicating functional movements to increase ROM, strength, coordination, balance, and proprioception in order to improve patient's ability to progress to PLOF and address remaining functional goals.  (see flow sheet as applicable)     Details if applicable:  reassessment   10  97535 Self Care/Home Management (timed):  improve patient knowledge and understanding of home injury/symptom/pain management, positioning, posture/ergonomics, home safety, transfer techniques, and HEP progression  to improve patient's ability to progress to PLOF and address remaining functional goals.  (see flow sheet as applicable)     Details if applicable:  pt ed, HEP update   48  MC BC Totals Reminder: bill using total billable min of TIMED therapeutic procedures (example: do not include dry needle or estim unattended, both untimed codes, in totals to left)  8-22 min = 1 unit; 23-37 min = 2 units; 38-52 min = 3 units; 53-67 min = 4  units; 68-82 min = 5 units   Total Total     Charge Capture    [x]   Patient Education billed concurrently with other procedures   [x]  Review HEP    []  Progressed/Changed HEP, detail:    []  Other detail:       Objective Information/Functional Measures/Assessment    [name] Short self-reports 5% improvement since beginning PT with variable progress. Pt reports functional gains that include: improved walking tolerance and ability to navigate uneven surfaces. Pt reports functional deficits that include: increased difficulty with STS transfers, weakness and stiffness in her right leg, decreased core strength, and limited ability to reach overhead. Pt reports a recent average pain of 6/10, 7/10 at worst, and 6/10 at best. Objectively pt is making progress with UE and LE strength, though still demonstrates deficits in right LE strength. She is still considered a fall risk based on increased 5XSTS time and TUG time. Pt would benefit from continued skilled PT to address remaining functional deficits to reduce her risk for falls and improve her ability to getup from chairs more easily and reach overhead. We will continue with PT at 1-2x/wk for 5 weeks.     Patient will continue to benefit from skilled PT / OT services to modify and progress therapeutic interventions, analyze and address functional mobility deficits, analyze and  address ROM deficits, analyze and address strength deficits, analyze and address soft tissue restrictions, analyze and cue for proper movement patterns, analyze and modify for postural abnormalities, analyze and address imbalance/dizziness, and instruct in home and community integration to address functional deficits and attain remaining goals.    Progress toward goals / Updated goals:  []   See Progress Note/Recertification    Short Term Goals: To be accomplished in 2 weeks  Pt will be independent with HEP to facilitate carry-over of functional gains made in PT at home & community. (Initial STG)                Eval: Established at eval              Current: MET, compliant 1x/day     2. Pt will be able to improve strength in bilateral biceps/triceps to at least 4+/5 to improve patient's ability to use UE for support during functional mobility at home & community. (Initial LTG)               Eval: Bicep: 4/5 bilaterally; triceps: 4-/5 bilaterally               Current: progressing, biceps: 4+/5 B, triceps: 4+/5 B     Long Term Goals: To be accomplished in 5 weeks  Pt will be able to improve strength in Hip flexion, seated hip ER/Adduction; knee extension/flexion to at least 5/5 to improve patient's ability to get in/out of chair with efficiency at home & community. (Initial LTG)               Eval: Hip flexion: 4-/5 bilaterally, ER: right = 4/5, left = 4+/5, Hip adduction: right = 4/5, left = 4+/5; Knee extension: right = 4/5 left = 4+/5, Knee flexion: 4-/5 bilaterally               Current: progressing, hip flex = R 4/5, L 4+/5; hip ER = R 4/5, L 4+/5; hip add = R 5/5, L 5/5; knee ext = R 4/5, L 5/5; knee flex = R 4/5, L 4+/5     2. Pt will improve LEFS score to 31 to demonstrate improvement in patient's ability to perform unrestricted ADLs/IADLs at home & community. (Initial LTG)              Eval: 21              Current: regressed, 14/80     3. Pt will report compliance with home HEP at end of care to enhance carry over of fuctional gains made with skilled therapy services in order to improve quality of life. (Initial LTG)               Eval: Established first HEP at eval              Current: progressing, compliant with initial HEP 1x/day, updated today     4. Pt will improve TUG score to at least 26 seconds with FWW to demonstrate a decrease in patients falls risk. (Initial LTG)              Eval: 36 with FWW    Current: regressed, 39 sec with FWW    5. Pt will improve her 5 time sit to stand time to least 23 seconds with UE to demonstrate improvement in her functional LE strength & decrease her risk for falls at  home.  Eval: 33 sec with ue support              Current: regressed, 48 sec with UE support, cuing to scoot forward in seat and improve anterior WS     Functional Gains: walking tolerance, ability to navigate uneven surfaces/gravel  Functional Deficits: difficulty getting up from chairs, weakness and stiffness in right leg, decreased core strength  % improvement: 5%  Pain   Average: 6/10       Best: 6/10     Worst: 7/10  Patient Goal: To be able to stand up on my tip toes and look up, reach up higher     Next PN 05/07/2024  RC or Medicaid tracking due 05/07/2024  Auth due (visit number/ date) 18 visits remaining by 08/03/2024    PLAN  - Continue Plan of Care    Ami JAYSON Furnish, PTA    05/06/2024    11:43 AM  If an interpreting service was utilized for treatment of this patient, the contents of this document represent the material reviewed with the patient via the interpreter.     Future Appointments   Date Time Provider Department Center   05/06/2024 12:10 PM Furnish Mady JAYSON JOSETTA [facility]   05/09/2024  2:50 PM Reather Chew,  [facility]   05/12/2024 12:10 PM Maryjane Pride, PT [facility]   05/18/2024 12:10 PM Arlis Ozell JOSETTA [facility]   05/20/2024 12:10 PM Rush Ivy, PT [facility]   05/24/2024 12:10 PM MMC PT [address] [facility]   05/26/2024 12:10 PM Arlis Ozell JOSETTA [facility]   05/31/2024  1:30 PM Arlis Ozell, PTA [facility]   06/02/2024 12:10 PM Maryjane Pride, PT [facility]         "

## 2024-05-06 NOTE — Progress Notes (Signed)
 "[facility] - [address]  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    Visits from [facility]: 6 Missed Visits: 0     Progress to Goals:    Short Term Goals: To be accomplished in 2 weeks    Pt will be independent with HEP to facilitate carry-over of functional gains made in PT at home & community. (Initial STG)               Eval: Established at eval              Current: MET, compliant 1x/day     2. Pt will be able to improve strength in bilateral biceps/triceps to at least 4+/5 to improve patient's ability to use UE for support during functional mobility at home & community. (Initial LTG)               Eval: Bicep: 4/5 bilaterally; triceps: 4-/5 bilaterally               Current: MET, biceps: 4+/5 B, triceps: 4+/5 B     Long Term Goals: To be accomplished in 5 weeks    Pt will be able to improve strength in Hip flexion, seated hip ER/Adduction; knee extension/flexion to at least 5/5 to improve patient's ability to get in/out of chair with efficiency at home & community. (Initial LTG)               Eval: Hip flexion: 4-/5 bilaterally, ER: right = 4/5, left = 4+/5, Hip adduction: right = 4/5, left = 4+/5; Knee extension: right = 4/5 left = 4+/5, Knee flexion: 4-/5 bilaterally               Current: progressing, hip flex = R 4/5, L 4+/5; hip ER = R 4/5, L 4+/5; hip add = R 5/5, L 5/5;  knee ext = R 4/5, L 5/5; knee flex = R 4/5, L 4+/5     2. Pt will improve LEFS score to 31 to demonstrate improvement in patient's ability to perform unrestricted ADLs/IADLs at home & community. (Initial LTG)              Eval: 21              Current: regressed, 14/80     3. Pt will report compliance with home HEP at end of care to enhance carry over of fuctional gains made with skilled therapy services in order to improve quality of life. (Initial LTG)               Eval: Established first HEP at eval              Current: progressing, compliant with initial HEP 1x/day, updated today  4. Pt will improve TUG score to at least 26 seconds with FWW to demonstrate a decrease in patients falls risk. (Initial LTG)              Eval: 36 with FWW               Current: regressed, 39 sec with FWW     5. Pt will improve her 5 time sit to stand time to least 23 seconds with UE to demonstrate improvement in her functional LE strength & decrease her risk for falls at home.               Eval: 33 sec with ue support              Current: regressed, 48 sec with UE support, cuing to scoot forward in seat and improve anterior WS       Key Functional Changes/Progress:     Functional Gains: walking tolerance, ability to navigate uneven surfaces/gravel  Functional Deficits: difficulty getting up from chairs, weakness and stiffness in right leg, decreased core strength  % improvement: 5%  Pain   Average: 6/10                  Best: 6/10                Worst: 7/10  Patient Goal: To be able to stand up on my tip toes and look up, reach up higher    Problem List: pain affecting function, decrease ROM, decrease strength, edema affection function, impaired gait/balance, decrease ADL/functional abilities, decrease activity tolerance, decrease flexibility/joint mobility, and decrease transfer abilities    Treatment Plan may include any combination of the following: 02889 Therapeutic Exercise, 97112 Neuromuscular Re-Education, 97140  Manual Therapy, 97530 Therapeutic Activity, 97535 Self Care/Home Management, 97014 Electrical Stim unattended / [phone] [facility]), and [id number] Gait Training    Primary mitigating factor which impeded progress:  None of these apply from preset list (refer to note for other details)    Patient Goal(s) has been updated and includes: To be able to stand up on my tip toes and look up, reach up higher    Goals for this certification period include and are to be achieved in   5  weeks:    Pt will be able to improve strength in Hip flexion, seated hip ER/Adduction; knee extension/flexion to at least 5/5 to improve patient's ability to get in/out of chair with efficiency at home & community. (Initial LTG)               RC: progressing, hip flex = R 4/5, L 4+/5; hip ER = R 4/5, L 4+/5; hip add = R 5/5, L 5/5; knee ext = R 4/5, L 5/5; knee flex = R 4/5, L 4+/5     2. Pt will improve LEFS score to 31 to demonstrate improvement in patient's ability to perform unrestricted ADLs/IADLs at home & community. (Initial LTG)              Current: regressed, 14/80     3. Pt will report compliance with home HEP at end of care to enhance carry over of fuctional gains made with skilled therapy services in order to improve quality of life. (Initial LTG)               RC: progressing, compliant with initial HEP 1x/day, updated today     4. Pt will improve TUG score to at least 26 seconds with  FWW to demonstrate a decrease in patients falls risk. (Initial LTG)               RC: regressed, 39 sec with FWW     5. Pt will improve her 5 time sit to stand time to least 23 seconds with UE to demonstrate improvement in her functional LE strength & decrease her risk for falls at home.               RC: regressed, 48 sec with UE support, cuing to scoot forward in seat and improve anterior WS     6. Pt will be able to improve strength in bilateral biceps/triceps to at least 4+/5 to improve patient's ability to use UE for support during functional mobility at  home & community. (Initial LTG)               RC: biceps: 4+/5 B, triceps: 4+/5 B   Updated goal: 5/5    Frequency / Duration:   Patient to be seen   1-2   times per week for   5    weeks:  Frequency / Duration: Patient would benefit from skilled PT 1-2 times per week for 24 VISITS sessions as needed in this certification period.  Goals will be assigned and reassessed every 10 visits/ 30 days per Medicare guidelines    Assessments/Recommendations:     Ms. Roa self-reports 5% improvement since beginning PT with variable progress. Pt reports functional gains that include: improved walking tolerance and ability to navigate uneven surfaces. Pt reports functional deficits that include: increased difficulty with STS transfers, weakness and stiffness in her right leg, decreased core strength, and limited ability to reach overhead. Pt reports a recent average pain of 6/10, 7/10 at worst, and 6/10 at best. Objectively pt is making progress with UE and LE strength, though still demonstrates deficits in right LE strength. She is still considered a fall risk based on increased 5XSTS time and TUG time. Pt would benefit from continued skilled PT to address remaining functional deficits to reduce her risk for falls and improve her ability to getup from chairs more easily and reach overhead. We will continue with PT at 1-2x/wk for 5 weeks.     If you have any questions/comments please contact us  directly at [phone].   Thank you for allowing us  to assist in the care of your patient.    Certification Period: 05/06/24 - 06/04/24  Reporting Period (date from last assessment to current assessment): 04/08/24 - 05/06/24  Next PN/RC: 06/04/24    Fonda Shay, PT       05/09/2024       7:30 AM      ___ I have read the above report and request that my patient continue as recommended.   ___ I have read the above report and request that my patient continue therapy with the following changes/special instructions:  ________________________________________________   ___ I have read the above report and request that my patient be discharged from therapy.     Physician's Signature:_________________________   DATE:_________   TIME:________                           Ike Olam BIRCH, PA    ** Signature, Date and Time must be completed for valid certification **  Please sign and return to [facility] or you may fax the signed copy to [phone]  Thank you.      "  Fetters, Olam BIRCH, PA     ** Signature, Date and Time must be completed for valid certification **  Please sign and return to [facility] or you may fax the signed copy to [phone]  Thank you.  "

## 2024-05-09 ENCOUNTER — Inpatient Hospital Stay: Admit: 2024-05-09 | Payer: Medicare (Managed Care) | Primary: Physician Assistant

## 2024-05-09 NOTE — Progress Notes (Signed)
 "PHYSICAL / OCCUPATIONAL THERAPY - DAILY TREATMENT NOTE    Patient Name: [name] Short    Date: 05/09/2024    DOB: Apr 24, 1956  Insurance: [place] / Plan: [place] [employer] [employer] [employer] DUAL / Product Type: *No Product type* /      Patient DOB verified Yes     Visit #   Current / Total 1 10   Time   In / Out 450 531   Pain   In / Out 7 6   Subjective Functional Status/Changes: I have some pain this evening, trying to stand up taller     TREATMENT AREA =  Decreased strength  Loss of balance    OBJECTIVE         Therapeutic Procedures:    Tx Min Billable or 1:1 Min (if diff from Tx Min) Procedure, Rationale, Specifics   32 32 97110 Therapeutic Exercise (timed):  increase ROM, strength, coordination, balance, and proprioception to improve patient's ability to progress to PLOF and address remaining functional goals. (see flow sheet as applicable)     Details if applicable:  LE/UE strengthening     9 9 97112 Neuromuscular Re-Education (timed):  improve balance, coordination, kinesthetic sense, posture, core stability and proprioception to improve patient's ability to develop conscious control of individual muscles and awareness of position of extremities in order to progress to PLOF and address remaining functional goals. (see flow sheet as applicable)     Details if applicable:  Balance static/dynamic   41 41 MC BC Totals Reminder: bill using total billable min of TIMED therapeutic procedures (example: do not include dry needle or estim unattended, both untimed codes, in totals to left)  8-22 min = 1 unit; 23-37 min = 2 units; 38-52 min = 3 units; 53-67 min = 4 units; 68-82 min = 5 units   Total Total     Charge Capture    [x]   Patient Education billed concurrently with other procedures   [x]  Review HEP    []  Progressed/Changed HEP, detail:    []  Other detail:       Objective Information/Functional Measures/Assessment    Pt tolerating session well, exercises consisting of both LE/UE strenghtening. Able to  complete hip 4 way for 12 reps during session today. Cues throughout session to improve posture with fair return however noting quick fatigue with task particularly during NBOS and mod. Tandem balance. Also attempting dynamic balance work via step taps but needing UE support to complete during session today. Needing intermittent rest breaks during standing exercises - performing biceps curls with 2# and tricep extension with RTB to improve patients ability to use UE for assistance during functional mobility.     Patient will continue to benefit from skilled PT / OT services to modify and progress therapeutic interventions, analyze and address functional mobility deficits, analyze and address ROM deficits, analyze and address strength deficits, analyze and address soft tissue restrictions, analyze and cue for proper movement patterns, analyze and modify for postural abnormalities, analyze and address imbalance/dizziness, and instruct in home and community integration to address functional deficits and attain remaining goals.    Progress toward goals / Updated goals:  []   See Progress Note/Recertification    Pt will be able to improve strength in Hip flexion, seated hip ER/Adduction; knee extension/flexion to at least 5/5 to improve patient's ability to get in/out of chair with efficiency at home & community. (Initial LTG)  RC: progressing, hip flex = R 4/5, L 4+/5; hip ER = R 4/5, L 4+/5; hip add = R 5/5, L 5/5; knee ext = R 4/5, L 5/5; knee flex = R 4/5, L 4+/5     2. Pt will improve LEFS score to 31 to demonstrate improvement in patient's ability to perform unrestricted ADLs/IADLs at home & community. (Initial LTG)              Current: regressed, 14/80     3. Pt will report compliance with home HEP at end of care to enhance carry over of fuctional gains made with skilled therapy services in order to improve quality of life. (Initial LTG)               RC: progressing, compliant with initial HEP  1x/day, updated today     4. Pt will improve TUG score to at least 26 seconds with FWW to demonstrate a decrease in patients falls risk. (Initial LTG)               RC: regressed, 39 sec with FWW     5. Pt will improve her 5 time sit to stand time to least 23 seconds with UE to demonstrate improvement in her functional LE strength & decrease her risk for falls at home.               RC: regressed, 48 sec with UE support, cuing to scoot forward in seat and improve anterior WS      6. Pt will be able to improve strength in bilateral biceps/triceps to at least 4+/5 to improve patient's ability to use UE for support during functional mobility at home & community. (Initial LTG)               RC: biceps: 4+/5 B, triceps: 4+/5 B              Updated goal: 5/5    Next PN 06/04/24  RC or Medicaid tracking due 06/04/24  Auth due (visit number/ date) 17 more til 08/03/24    PLAN  - Continue Plan of Care    [place], [place]    05/09/2024    5:33 PM  If an interpreting service was utilized for treatment of this patient, the contents of this document represent the material reviewed with the patient via the interpreter.     Future Appointments   Date Time Provider Department Center   05/12/2024 12:10 PM Maryjane Pride, PT [facility]   05/18/2024 12:10 PM Arlis Ozell HACKNEY [facility]   05/20/2024 12:10 PM Rush Ivy, PT [facility]   05/24/2024 12:10 PM [facility] PT [address] [facility]   05/26/2024 12:10 PM Arlis Ozell HACKNEY [facility]   05/31/2024  1:30 PM Arlis Ozell, PTA [facility]   06/02/2024 12:10 PM Maryjane Pride, PT [facility]         "

## 2024-05-09 NOTE — Progress Notes (Deleted)
 "[facility] - [address]  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    Visits from [facility]: 6 Missed Visits: 0     Progress to Goals:    Short Term Goals: To be accomplished in 2 weeks    Pt will be independent with HEP to facilitate carry-over of functional gains made in PT at home & community. (Initial STG)               Eval: Established at eval              Current: MET, compliant 1x/day     2. Pt will be able to improve strength in bilateral biceps/triceps to at least 4+/5 to improve patient's ability to use UE for support during functional mobility at home & community. (Initial LTG)               Eval: Bicep: 4/5 bilaterally; triceps: 4-/5 bilaterally               Current: MET, biceps: 4+/5 B, triceps: 4+/5 B     Long Term Goals: To be accomplished in 5 weeks    Pt will be able to improve strength in Hip flexion, seated hip ER/Adduction; knee extension/flexion to at least 5/5 to improve patient's ability to get in/out of chair with efficiency at home & community. (Initial LTG)               Eval: Hip flexion: 4-/5 bilaterally, ER: right = 4/5, left = 4+/5, Hip adduction: right = 4/5, left = 4+/5; Knee extension: right = 4/5 left = 4+/5, Knee flexion: 4-/5 bilaterally               Current: progressing, hip flex = R 4/5, L 4+/5; hip ER = R 4/5, L 4+/5; hip add = R 5/5, L 5/5;  knee ext = R 4/5, L 5/5; knee flex = R 4/5, L 4+/5     2. Pt will improve LEFS score to 31 to demonstrate improvement in patient's ability to perform unrestricted ADLs/IADLs at home & community. (Initial LTG)              Eval: 21              Current: regressed, 14/80     3. Pt will report compliance with home HEP at end of care to enhance carry over of fuctional gains made with skilled therapy services in order to improve quality of life. (Initial LTG)               Eval: Established first HEP at eval              Current: progressing, compliant with initial HEP 1x/day, updated today  4. Pt will improve TUG score to at least 26 seconds with FWW to demonstrate a decrease in patients falls risk. (Initial LTG)              Eval: 36 with FWW               Current: regressed, 39 sec with FWW     5. Pt will improve her 5 time sit to stand time to least 23 seconds with UE to demonstrate improvement in her functional LE strength & decrease her risk for falls at home.               Eval: 33 sec with ue support              Current: regressed, 48 sec with UE support, cuing to scoot forward in seat and improve anterior WS       Key Functional Changes/Progress:     Functional Gains: walking tolerance, ability to navigate uneven surfaces/gravel  Functional Deficits: difficulty getting up from chairs, weakness and stiffness in right leg, decreased core strength  % improvement: 5%  Pain   Average: 6/10                  Best: 6/10                Worst: 7/10  Patient Goal: To be able to stand up on my tip toes and look up, reach up higher    Problem List: pain affecting function, decrease ROM, decrease strength, edema affection function, impaired gait/balance, decrease ADL/functional abilities, decrease activity tolerance, decrease flexibility/joint mobility, and decrease transfer abilities    Treatment Plan may include any combination of the following: 02889 Therapeutic Exercise, 97112 Neuromuscular Re-Education, 97140  Manual Therapy, 97530 Therapeutic Activity, 97535 Self Care/Home Management, 97014 Electrical Stim unattended / [phone] [facility]), and [id number] Gait Training    Primary mitigating factor which impeded progress:  None of these apply from preset list (refer to note for other details)    Patient Goal(s) has been updated and includes: To be able to stand up on my tip toes and look up, reach up higher    Goals for this certification period include and are to be achieved in   5  weeks:    Pt will be able to improve strength in Hip flexion, seated hip ER/Adduction; knee extension/flexion to at least 5/5 to improve patient's ability to get in/out of chair with efficiency at home & community. (Initial LTG)               RC: progressing, hip flex = R 4/5, L 4+/5; hip ER = R 4/5, L 4+/5; hip add = R 5/5, L 5/5; knee ext = R 4/5, L 5/5; knee flex = R 4/5, L 4+/5     2. Pt will improve LEFS score to 31 to demonstrate improvement in patient's ability to perform unrestricted ADLs/IADLs at home & community. (Initial LTG)              Current: regressed, 14/80     3. Pt will report compliance with home HEP at end of care to enhance carry over of fuctional gains made with skilled therapy services in order to improve quality of life. (Initial LTG)               RC: progressing, compliant with initial HEP 1x/day, updated today     4. Pt will improve TUG score to at least 26 seconds with  FWW to demonstrate a decrease in patients falls risk. (Initial LTG)               RC: regressed, 39 sec with FWW     5. Pt will improve her 5 time sit to stand time to least 23 seconds with UE to demonstrate improvement in her functional LE strength & decrease her risk for falls at home.               RC: regressed, 48 sec with UE support, cuing to scoot forward in seat and improve anterior WS     6. Pt will be able to improve strength in bilateral biceps/triceps to at least 4+/5 to improve patient's ability to use UE for support during functional mobility at  home & community. (Initial LTG)               RC: biceps: 4+/5 B, triceps: 4+/5 B   Updated goal: 5/5    Frequency / Duration:   Patient to be seen   1-2   times per week for   5    weeks:  Frequency / Duration: Patient would benefit from skilled PT 1-2 times per week for 24 VISITS sessions as needed in this certification period.  Goals will be assigned and reassessed every 10 visits/ 30 days per Medicare guidelines    Assessments/Recommendations:     Ms. Roa self-reports 5% improvement since beginning PT with variable progress. Pt reports functional gains that include: improved walking tolerance and ability to navigate uneven surfaces. Pt reports functional deficits that include: increased difficulty with STS transfers, weakness and stiffness in her right leg, decreased core strength, and limited ability to reach overhead. Pt reports a recent average pain of 6/10, 7/10 at worst, and 6/10 at best. Objectively pt is making progress with UE and LE strength, though still demonstrates deficits in right LE strength. She is still considered a fall risk based on increased 5XSTS time and TUG time. Pt would benefit from continued skilled PT to address remaining functional deficits to reduce her risk for falls and improve her ability to getup from chairs more easily and reach overhead. We will continue with PT at 1-2x/wk for 5 weeks.     If you have any questions/comments please contact us  directly at [phone].   Thank you for allowing us  to assist in the care of your patient.    Certification Period: 05/06/24 - 06/04/24  Reporting Period (date from last assessment to current assessment): 04/08/24 - 05/06/24  Next PN/RC: 06/04/24    Fonda Shay, PT       05/09/2024       7:30 AM      ___ I have read the above report and request that my patient continue as recommended.   ___ I have read the above report and request that my patient continue therapy with the following changes/special instructions:  ________________________________________________   ___ I have read the above report and request that my patient be discharged from therapy.     Physician's Signature:_________________________   DATE:_________   TIME:________                           Ike Olam BIRCH, PA    ** Signature, Date and Time must be completed for valid certification **  Please sign and return to [facility] or you may fax the signed copy to [phone]  Thank you.      "

## 2024-05-12 ENCOUNTER — Inpatient Hospital Stay: Admit: 2024-05-12 | Payer: Medicare (Managed Care) | Primary: Physician Assistant

## 2024-05-12 NOTE — Progress Notes (Signed)
 "PHYSICAL / OCCUPATIONAL THERAPY - DAILY TREATMENT NOTE    Patient Name: [name] Short    Date: 05/12/2024    DOB: [birth year]  Insurance: [place] / Plan: [place] [employer] [employer] [place] / Product Type: *No Product type* /      Patient DOB verified Yes     Visit #   Current / Total 2 10   Time   In / Out 1219 1257   Pain   In / Out 7 6   Subjective Functional Status/Changes: My back and legs hurt today.     TREATMENT AREA =  Decreased strength  Loss of balance    OBJECTIVE         Therapeutic Procedures:    Tx Min Billable or 1:1 Min (if diff from Tx Min) Procedure, Rationale, Specifics   28 28 97110 Therapeutic Exercise (timed):  increase ROM, strength, coordination, balance, and proprioception to improve patient's ability to progress to PLOF and address remaining functional goals. (see flow sheet as applicable)     Details if applicable:       10 10 97112 Neuromuscular Re-Education (timed):  improve balance, coordination, kinesthetic sense, posture, core stability and proprioception to improve patient's ability to develop conscious control of individual muscles and awareness of position of extremities in order to progress to PLOF and address remaining functional goals. (see flow sheet as applicable)     Details if applicable:     38 38 MC BC Totals Reminder: bill using total billable min of TIMED therapeutic procedures (example: do not include dry needle or estim unattended, both untimed codes, in totals to left)  8-22 min = 1 unit; 23-37 min = 2 units; 38-52 min = 3 units; 53-67 min = 4 units; 68-82 min = 5 units   Total Total     Charge Capture    [x]   Patient Education billed concurrently with other procedures   [x]  Review HEP    []  Progressed/Changed HEP, detail:    []  Other detail:       Objective Information/Functional Measures/Assessment    Pt still requires frequent seated rest breaks during exercises, but able to perform active rest breaks as she performs seated exercises between  standing exercises. Still needs cuing for downward gaze and shuffled gait, but did have an increased pace with walking down the hall today. Continues to have safety issues with sit to stands. Continue to progress as able.     Patient will continue to benefit from skilled PT / OT services to modify and progress therapeutic interventions, analyze and address functional mobility deficits, analyze and address ROM deficits, analyze and address strength deficits, analyze and address soft tissue restrictions, analyze and cue for proper movement patterns, analyze and modify for postural abnormalities, analyze and address imbalance/dizziness, and instruct in home and community integration to address functional deficits and attain remaining goals.    Progress toward goals / Updated goals:  []   See Progress Note/Recertification    Pt will be able to improve strength in Hip flexion, seated hip ER/Adduction; knee extension/flexion to at least 5/5 to improve patient's ability to get in/out of chair with efficiency at home & community. (Initial LTG)               RC: progressing, hip flex = R 4/5, L 4+/5; hip ER = R 4/5, L 4+/5; hip add = R 5/5, L 5/5; knee ext = R 4/5, L 5/5; knee flex = R 4/5, L  4+/5     2. Pt will improve LEFS score to 31 to demonstrate improvement in patient's ability to perform unrestricted ADLs/IADLs at home & community. (Initial LTG)              Current: regressed, 14/80     3. Pt will report compliance with home HEP at end of care to enhance carry over of fuctional gains made with skilled therapy services in order to improve quality of life. (Initial LTG)               RC: progressing, compliant with initial HEP 1x/day, updated today     4. Pt will improve TUG score to at least 26 seconds with FWW to demonstrate a decrease in patients falls risk. (Initial LTG)               RC: regressed, 39 sec with FWW     5. Pt will improve her 5 time sit to stand time to least 23 seconds with UE to demonstrate  improvement in her functional LE strength & decrease her risk for falls at home.               RC: regressed, 48 sec with UE support, cuing to scoot forward in seat and improve anterior WS      6. Pt will be able to improve strength in bilateral biceps/triceps to at least 4+/5 to improve patient's ability to use UE for support during functional mobility at home & community. (Initial LTG)               RC: biceps: 4+/5 B, triceps: 4+/5 B              Updated goal: 5/5    Next PN 06/04/2021  RC or Medicaid tracking due 06/04/2024  Auth due (visit number/ date) 16 visits remaining by 08/03/2024    PLAN  - Continue Plan of Care    Dorise Ni, PTA, CSCS    05/12/2024    11:05 AM  If an interpreting service was utilized for treatment of this patient, the contents of this document represent the material reviewed with the patient via the interpreter.     Future Appointments   Date Time Provider Department Center   05/12/2024 12:10 PM Ni Dorise, PT [facility]   05/18/2024 12:10 PM Arlis Ozell HACKNEY [facility]   05/20/2024 12:10 PM Rush Ivy, PT [facility]   05/24/2024 12:10 PM [facility] PT [address] [facility]   05/26/2024 12:10 PM Arlis Ozell HACKNEY [facility]   05/31/2024  1:30 PM Arlis Ozell, PTA [facility]   06/02/2024 12:10 PM Ni Dorise, PT [facility]         "

## 2024-05-18 ENCOUNTER — Inpatient Hospital Stay: Admit: 2024-05-18 | Payer: Medicare (Managed Care) | Primary: Physician Assistant

## 2024-05-18 NOTE — Progress Notes (Addendum)
 "PHYSICAL / OCCUPATIONAL THERAPY - DAILY TREATMENT NOTE    Patient Name: [name] Short    Date: 05/18/2024    DOB: 1955/08/27  Insurance: [place] / Plan: [place] [employer] [employer] [employer] D[place] / Product Type: *No Product type* /      Patient DOB verified Yes     Visit #   Current / Total 3 10   Time   In / Out 1210 1250   Pain   In / Out 6 6   Subjective Functional Status/Changes: Pt reports PT is helping with the pain.     TREATMENT AREA =  Decreased strength  Loss of balance    OBJECTIVE         Therapeutic Procedures:    Tx Min Billable or 1:1 Min (if diff from Tx Min) Procedure, Rationale, Specifics   15  97110 Therapeutic Exercise (timed):  increase ROM, strength, coordination, balance, and proprioception to improve patient's ability to progress to PLOF and address remaining functional goals. (see flow sheet as applicable)     Details if applicable:       10  97112 Neuromuscular Re-Education (timed):  improve balance, coordination, kinesthetic sense, posture, core stability and proprioception to improve patient's ability to develop conscious control of individual muscles and awareness of position of extremities in order to progress to PLOF and address remaining functional goals. (see flow sheet as applicable)     Details if applicable:     15  97530 Therapeutic Activity (timed):  use of dynamic activities replicating functional movements to increase ROM, strength, coordination, balance, and proprioception in order to improve patient's ability to progress to PLOF and address remaining functional goals.  (see flow sheet as applicable)     Details if applicable:            Details if applicable:            Details if applicable:     40  MC BC Totals Reminder: bill using total billable min of TIMED therapeutic procedures (example: do not include dry needle or estim unattended, both untimed codes, in totals to left)  8-22 min = 1 unit; 23-37 min = 2 units; 38-52 min = 3 units; 53-67 min = 4 units;  68-82 min = 5 units   Total Total     Charge Capture    [x]   Patient Education billed concurrently with other procedures   [x]  Review HEP    []  Progressed/Changed HEP, detail:    []  Other detail:       Objective Information/Functional Measures/Assessment    Pt reports no adverse effects to treatment session.Pt able to increase reps with step up with fair strength and no elevation in pain today.Pt needed frequent verbal and tactile cues to increase upright posture performing same.Pt still has difficulty performing STS from 24 chair to date.Pt still well challenged with performing NBOS standing activities in parallel bars EC>EO at this time.Reviewed HEP and Pt was able to perform demo.Progress as able.    Patient will continue to benefit from skilled PT / OT services to modify and progress therapeutic interventions, analyze and address functional mobility deficits, analyze and address ROM deficits, analyze and address strength deficits, analyze and address soft tissue restrictions, analyze and cue for proper movement patterns, analyze and modify for postural abnormalities, analyze and address imbalance/dizziness, and instruct in home and community integration to address functional deficits and attain remaining goals.    Progress toward goals / Updated goals:  []   See Progress Note/Recertification    Pt will be able to improve strength in Hip flexion, seated hip ER/Adduction; knee extension/flexion to at least 5/5 to improve patient's ability to get in/out of chair with efficiency at home & community. (Initial LTG)               RC: progressing, hip flex = R 4/5, L 4+/5; hip ER = R 4/5, L 4+/5; hip add = R 5/5, L 5/5; knee ext = R 4/5, L 5/5; knee flex = R 4/5, L 4+/5     2. Pt will improve LEFS score to 31 to demonstrate improvement in patient's ability to perform unrestricted ADLs/IADLs at home & community. (Initial LTG)              Current: regressed, 14/80     3. Pt will report compliance with home HEP at end of  care to enhance carry over of fuctional gains made with skilled therapy services in order to improve quality of life. (Initial LTG)               RC: progressing, compliant with initial HEP 1x/day, updated today     4. Pt will improve TUG score to at least 26 seconds with FWW to demonstrate a decrease in patients falls risk. (Initial LTG)               RC: regressed, 39 sec with FWW     5. Pt will improve her 5 time sit to stand time to least 23 seconds with UE to demonstrate improvement in her functional LE strength & decrease her risk for falls at home.               RC: regressed, 48 sec with UE support, cuing to scoot forward in seat and improve anterior WS      6. Pt will be able to improve strength in bilateral biceps/triceps to at least 4+/5 to improve patient's ability to use UE for support during functional mobility at home & community. (Initial LTG)               RC: biceps: 4+/5 B, triceps: 4+/5 B              Updated goal: 5/5    Next PN 06/05/24  RC or Medicaid tracking due 06/04/24  Auth due (visit number/ date) 15 visits remaining by 08/03/24    PLAN  - Continue Plan of Care    Ozell Low, PTA    05/18/2024    12:25 PM  If an interpreting service was utilized for treatment of this patient, the contents of this document represent the material reviewed with the patient via the interpreter.     Future Appointments   Date Time Provider Department Center   05/20/2024 12:10 PM Rush Ivy, PT [facility]   05/24/2024 12:10 PM [facility] PT [address] [facility]   05/26/2024 12:10 PM Low Ozell HACKNEY [facility]   05/31/2024  1:30 PM Low Ozell, PTA [facility]   06/02/2024 12:10 PM Maryjane Pride, PT [facility]        "

## 2024-05-20 ENCOUNTER — Inpatient Hospital Stay: Payer: Medicare (Managed Care) | Primary: Physician Assistant

## 2024-05-24 ENCOUNTER — Encounter: Payer: Medicare (Managed Care) | Primary: Physician Assistant

## 2024-05-24 ENCOUNTER — Inpatient Hospital Stay: Admit: 2024-05-24 | Payer: Medicare (Managed Care) | Primary: Physician Assistant

## 2024-05-24 NOTE — Progress Notes (Signed)
 "PHYSICAL / OCCUPATIONAL THERAPY - DAILY TREATMENT NOTE    Patient Name: [name] Short    Date: 05/24/2024    DOB: 08-21-1955  Ins[place]: [place] / Plan: [place] [employer] [employer] [employer] [place] / Product Type: *No Product type* /      Patient DOB verified Yes     Visit #   Current / Total 4 10   Time   In / Out 2:11 255   Pain   In / Out 6 6   Subjective Functional Status/Changes: No new complaint      TREATMENT AREA =  Decreased strength  Loss of balance    OBJECTIVE         Therapeutic Procedures:    Tx Min Billable or 1:1 Min (if diff from Tx Min) Procedure, Rationale, Specifics    15 97110 Therapeutic Exercise (timed):  increase ROM, strength, coordination, balance, and proprioception to improve patient's ability to progress to PLOF and address remaining functional goals. (see flow sheet as applicable)     Details if applicable:        14 97112 Neuromuscular Re-Education (timed):  improve balance, coordination, kinesthetic sense, posture, core stability and proprioception to improve patient's ability to develop conscious control of individual muscles and awareness of position of extremities in order to progress to PLOF and address remaining functional goals. (see flow sheet as applicable)     Details if applicable:      15 97530 Therapeutic Activity (timed):  use of dynamic activities replicating functional movements to increase ROM, strength, coordination, balance, and proprioception in order to improve patient's ability to progress to PLOF and address remaining functional goals.  (see flow sheet as applicable)     Details if applicable:      44 MC BC Totals Reminder: bill using total billable min of TIMED therapeutic procedures (example: do not include dry needle or estim unattended, both untimed codes, in totals to left)  8-22 min = 1 unit; 23-37 min = 2 units; 38-52 min = 3 units; 53-67 min = 4 units; 68-82 min = 5 units   Total Total     Charge Capture    [x]   Patient Education billed  concurrently with other procedures   [x]  Review HEP    []  Progressed/Changed HEP, detail:    []  Other detail:       Objective Information/Functional Measures/Assessment    Initiated Tband row and ext with yellow Tband in sitting cues for correct up right sitting posture   Noted patient with decreased ROM for hip ext in standing initiated hip flexor stretching   Required frequent verbal and tactile cues to increase upright posture performing exercise  Performed sit <> stand from chair with BUE support and cues for anterior weight shift an cues for B feet position    Initiated t/s extension with 1/2 foam in sitting to address posture and postural awareness cues provided for muscle activation   Progress as able.    Patient will continue to benefit from skilled PT / OT services to modify and progress therapeutic interventions, analyze and address functional mobility deficits, analyze and address ROM deficits, analyze and address strength deficits, analyze and address soft tissue restrictions, analyze and cue for proper movement patterns, analyze and modify for postural abnormalities, analyze and address imbalance/dizziness, and instruct in home and community integration to address functional deficits and attain remaining goals.    Progress toward goals / Updated goals:  []   See Progress Note/Recertification  Pt will be able to improve strength in Hip flexion, seated hip ER/Adduction; knee extension/flexion to at least 5/5 to improve patient's ability to get in/out of chair with efficiency at home & community. (Initial LTG)               RC: progressing, hip flex = R 4/5, L 4+/5; hip ER = R 4/5, L 4+/5; hip add = R 5/5, L 5/5; knee ext = R 4/5, L 5/5; knee flex = R 4/5, L 4+/5     2. Pt will improve LEFS score to 31 to demonstrate improvement in patient's ability to perform unrestricted ADLs/IADLs at home & community. (Initial LTG)              Current: regressed, 14/80     3. Pt will report compliance with home HEP at  end of care to enhance carry over of fuctional gains made with skilled therapy services in order to improve quality of life. (Initial LTG)               RC: progressing, compliant with initial HEP 1x/day, updated today     4. Pt will improve TUG score to at least 26 seconds with FWW to demonstrate a decrease in patients falls risk. (Initial LTG)               RC: regressed, 39 sec with FWW     5. Pt will improve her 5 time sit to stand time to least 23 seconds with UE to demonstrate improvement in her functional LE strength & decrease her risk for falls at home.               RC: regressed, 48 sec with UE support, cuing to scoot forward in seat and improve anterior WS   Current 05/24/24: addressing with sit <> stand from chair with cues      6. Pt will be able to improve strength in bilateral biceps/triceps to at least 4+/5 to improve patient's ability to use UE for support during functional mobility at home & community. (Initial LTG)               RC: biceps: 4+/5 B, triceps: 4+/5 B              Updated goal: 5/5    Next PN 06/05/24  RC or Medicaid tracking due 06/04/24  Auth due (visit number/ date) 15 visits remaining by 08/03/24    PLAN  - Continue Plan of Care    Robbin GLENWOOD Mace, PT    05/24/2024    2:38 PM  If an interpreting service was utilized for treatment of this patient, the contents of this document represent the material reviewed with the patient via the interpreter.     Future Appointments   Date Time Provider Department Center   05/26/2024 12:10 PM Arlis Ozell [facility]   05/31/2024  1:30 PM Arlis Ozell [facility]   06/02/2024 12:10 PM Maryjane Pride, PT [facility]   06/07/2024 12:10 PM Reather Chew, [place] [facility]        "

## 2024-05-25 ENCOUNTER — Other Ambulatory Visit: Payer: Self-pay

## 2024-05-25 DIAGNOSIS — Z8639 Personal history of other endocrine, nutritional and metabolic disease: Secondary | ICD-10-CM

## 2024-05-25 MED ORDER — SYNJARDY 5-500 MG PO TABS
1.0000 | ORAL_TABLET | Freq: Two times a day (BID) | ORAL | 3 refills | Status: AC
  Filled 2024-08-24: qty 60, 30d supply, fill #0

## 2024-05-26 ENCOUNTER — Inpatient Hospital Stay: Admit: 2024-05-26 | Payer: Medicare (Managed Care) | Primary: Physician Assistant

## 2024-05-26 NOTE — Progress Notes (Signed)
 "PHYSICAL / OCCUPATIONAL THERAPY - DAILY TREATMENT NOTE    Patient Name: [name] Short    Date: 05/26/2024    DOB: June 01, 1956  Ins[place]: [place] / Plan: [place] [employer] [employer] [place] / Product Type: *No Product type* /      Patient DOB verified Yes     Visit #   Current / Total 5 10   Time   In / Out 1210 1250   Pain   In / Out 6 6   Subjective Functional Status/Changes: Pt reports feeling better today.     TREATMENT AREA =  Decreased strength  Loss of balance    OBJECTIVE         Therapeutic Procedures:    Tx Min Billable or 1:1 Min (if diff from Tx Min) Procedure, Rationale, Specifics   15 15 97110 Therapeutic Exercise (timed):  increase ROM, strength, coordination, balance, and proprioception to improve patient's ability to progress to PLOF and address remaining functional goals. (see flow sheet as applicable)     Details if applicable:       15 15 97112 Neuromuscular Re-Education (timed):  improve balance, coordination, kinesthetic sense, posture, core stability and proprioception to improve patient's ability to develop conscious control of individual muscles and awareness of position of extremities in order to progress to PLOF and address remaining functional goals. (see flow sheet as applicable)     Details if applicable:     10 10 97530 Therapeutic Activity (timed):  use of dynamic activities replicating functional movements to increase ROM, strength, coordination, balance, and proprioception in order to improve patient's ability to progress to PLOF and address remaining functional goals.  (see flow sheet as applicable)     Details if applicable:            Details if applicable:            Details if applicable:     40 40 MC BC Totals Reminder: bill using total billable min of TIMED therapeutic procedures (example: do not include dry needle or estim unattended, both untimed codes, in totals to left)  8-22 min = 1 unit; 23-37 min = 2 units; 38-52 min = 3 units; 53-67 min = 4 units;  68-82 min = 5 units   Total Total     Charge Capture    [x]   Patient Education billed concurrently with other procedures   [x]  Review HEP    []  Progressed/Changed HEP, detail:    []  Other detail:       Objective Information/Functional Measures/Assessment    Pt reports no adverse effects from treatment session.Pt able to increase reps with biceps curls with good return.Pt is well challenged with tandem balance exercises in parallel bars EC>EO  at this time.Pt able to increase wt with LAQ with good strength and no elevation in pain.Pt progressing well with STS transfers with better upright postural awareness and increase BOS today.Progress as able    Patient will continue to benefit from skilled PT / OT services to modify and progress therapeutic interventions, analyze and address functional mobility deficits, analyze and address ROM deficits, analyze and address strength deficits, analyze and address soft tissue restrictions, analyze and cue for proper movement patterns, analyze and modify for postural abnormalities, analyze and address imbalance/dizziness, and instruct in home and community integration to address functional deficits and attain remaining goals.    Progress toward goals / Updated goals:  []   See Progress Note/Recertification  Pt will be able to improve strength in Hip flexion, seated hip ER/Adduction; knee extension/flexion to at least 5/5 to improve patient's ability to get in/out of chair with efficiency at home & community. (Initial LTG)               RC: progressing, hip flex = R 4/5, L 4+/5; hip ER = R 4/5, L 4+/5; hip add = R 5/5, L 5/5; knee ext = R 4/5, L 5/5; knee flex = R 4/5, L 4+/5     2. Pt will improve LEFS score to 31 to demonstrate improvement in patient's ability to perform unrestricted ADLs/IADLs at home & community. (Initial LTG)              Current: regressed, 14/80     3. Pt will report compliance with home HEP at end of care to enhance carry over of fuctional gains made with  skilled therapy services in order to improve quality of life. (Initial LTG)               RC: progressing, compliant with initial HEP 1x/day, updated today     4. Pt will improve TUG score to at least 26 seconds with FWW to demonstrate a decrease in patients falls risk. (Initial LTG)               RC: regressed, 39 sec with FWW     5. Pt will improve her 5 time sit to stand time to least 23 seconds with UE to demonstrate improvement in her functional LE strength & decrease her risk for falls at home.               RC: regressed, 48 sec with UE support, cuing to scoot forward in seat and improve anterior WS   Current 05/24/24: addressing with sit <> stand from chair with cues      6. Pt will be able to improve strength in bilateral biceps/triceps to at least 4+/5 to improve patient's ability to use UE for support during functional mobility at home & community. (Initial LTG)               RC: biceps: 4+/5 B, triceps: 4+/5 B              Updated goal: 5/5    Next PN 06/05/24  RC or Medicaid tracking due 06/05/24  Auth due (visit number/ date) 14 visits remaining by 08/03/24    PLAN  - Continue Plan of Care    Ozell Low, PTA    05/26/2024    12:18 PM  If an interpreting service was utilized for treatment of this patient, the contents of this document represent the material reviewed with the patient via the interpreter.     Future Appointments   Date Time Provider Department Center   05/31/2024  1:30 PM Low Ozell [facility]   06/02/2024 12:10 PM Maryjane Pride, PT [facility]   06/07/2024 12:10 PM Reather Chew, [place] [facility]        "

## 2024-05-31 ENCOUNTER — Inpatient Hospital Stay: Admit: 2024-05-31 | Payer: Medicare (Managed Care) | Primary: Physician Assistant

## 2024-05-31 NOTE — Progress Notes (Signed)
 "PHYSICAL / OCCUPATIONAL THERAPY - DAILY TREATMENT NOTE    Patient Name: [name] Short    Date: 05/31/2024    DOB: [birth year]  Insurance: [place] / Plan: [place] [employer] [employer] [place] / Product Type: *No Product type* /      Patient DOB verified Yes     Visit #   Current / Total 6 10   Time   In / Out 130  210   Pain   In / Out 6 4   Subjective Functional Status/Changes: Pt reports PT is helping with the pain     TREATMENT AREA =  Decreased strength  Loss of balance    OBJECTIVE         Therapeutic Procedures:    Tx Min Billable or 1:1 Min (if diff from Tx Min) Procedure, Rationale, Specifics   15  97110 Therapeutic Exercise (timed):  increase ROM, strength, coordination, balance, and proprioception to improve patient's ability to progress to PLOF and address remaining functional goals. (see flow sheet as applicable)     Details if applicable:       15  97112 Neuromuscular Re-Education (timed):  improve balance, coordination, kinesthetic sense, posture, core stability and proprioception to improve patient's ability to develop conscious control of individual muscles and awareness of position of extremities in order to progress to PLOF and address remaining functional goals. (see flow sheet as applicable)     Details if applicable:     10  97530 Therapeutic Activity (timed):  use of dynamic activities replicating functional movements to increase ROM, strength, coordination, balance, and proprioception in order to improve patient's ability to progress to PLOF and address remaining functional goals.  (see flow sheet as applicable)     Details if applicable:            Details if applicable:            Details if applicable:     40  MC BC Totals Reminder: bill using total billable min of TIMED therapeutic procedures (example: do not include dry needle or estim unattended, both untimed codes, in totals to left)  8-22 min = 1 unit; 23-37 min = 2 units; 38-52 min = 3 units; 53-67 min = 4 units; 68-82  min = 5 units   Total Total     Charge Capture    [x]   Patient Education billed concurrently with other procedures   [x]  Review HEP    []  Progressed/Changed HEP, detail:    []  Other detail:       Objective Information/Functional Measures/Assessment    Pt reports no adverse effects from treatment session.Pt able to increase reps with step ups and step taps with better strength and balance and no increase in pain today.Pt able to perform LAQ with some R quad lag which decreased with reps.Pt well challenged with NBOS balance activities in parallel bars EC>EO today.Progress as able.    Patient will continue to benefit from skilled PT / OT services to modify and progress therapeutic interventions, analyze and address functional mobility deficits, analyze and address ROM deficits, analyze and address strength deficits, analyze and address soft tissue restrictions, analyze and cue for proper movement patterns, analyze and modify for postural abnormalities, analyze and address imbalance/dizziness, and instruct in home and community integration to address functional deficits and attain remaining goals.    Progress toward goals / Updated goals:  []   See Progress Note/Recertification       1 Pt will  be able to improve strength in Hip flexion, seated hip ER/Adduction; knee extension/flexion to at least 5/5 to improve patient's ability to get in/out of chair with efficiency at home & community. (Initial LTG)               RC: progressing, hip flex = R 4/5, L 4+/5; hip ER = R 4/5, L 4+/5; hip add = R 5/5, L 5/5; knee ext = R 4/5, L 5/5; knee flex = R 4/5, L 4+/5     2. Pt will improve LEFS score to 31 to demonstrate improvement in patient's ability to perform unrestricted ADLs/IADLs at home & community. (Initial LTG)              Current: regressed, 14/80     3. Pt will report compliance with home HEP at end of care to enhance carry over of fuctional gains made with skilled therapy services in order to improve quality of life.  (Initial LTG)               RC: progressing, compliant with initial HEP 1x/day, updated today     4. Pt will improve TUG score to at least 26 seconds with FWW to demonstrate a decrease in patients falls risk. (Initial LTG)               RC: regressed, 39 sec with FWW     5. Pt will improve her 5 time sit to stand time to least 23 seconds with UE to demonstrate improvement in her functional LE strength & decrease her risk for falls at home.               RC: regressed, 48 sec with UE support, cuing to scoot forward in seat and improve anterior WS   Current 05/24/24: addressing with sit <> stand from chair with cues      6. Pt will be able to improve strength in bilateral biceps/triceps to at least 4+/5 to improve patient's ability to use UE for support during functional mobility at home & community. (Initial LTG)               RC: biceps: 4+/5 B, triceps: 4+/5 B              Updated goal: 5/5    Next PN 06/05/24  RC or Medicaid tracking due 06/05/24  Auth due (visit number/ date) 14 visits remaining by 08/03/24    PLAN  - Continue Plan of Care    Ozell Low, PTA    05/31/2024    1:29 PM  If an interpreting service was utilized for treatment of this patient, the contents of this document represent the material reviewed with the patient via the interpreter.     Future Appointments   Date Time Provider Department Center   05/31/2024  1:30 PM Low Ozell [facility]   06/02/2024 12:10 PM Maryjane Pride, PT [facility]   06/07/2024 12:10 PM Reather Chew,  [facility]        "

## 2024-06-02 ENCOUNTER — Inpatient Hospital Stay: Admit: 2024-06-02 | Payer: Medicare (Managed Care) | Primary: Physician Assistant

## 2024-06-02 NOTE — Therapy Recertification (Signed)
 "[facility] - [address]  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    Vis[facility] from [facility]: 13 Missed Visits: 0     Progress to Goals:     1 Pt will be able to improve strength in Hip flexion, seated hip ER/Adduction; knee extension/flexion to at least 5/5 to improve patient's ability to get in/out of chair with efficiency at home & community. (Initial LTG)               RC: progressing, hip flex = R 4/5, L 4+/5; hip ER = R 4/5, L 4+/5; hip add = R 5/5, L 5/5; knee ext = R 4/5, L 5/5; knee flex = R 4/5, L 4+/5               NOT MET; Hip Flexion: right 4/5, left 4+/5, Hip ER: right 4/5, left 4/5, Hip Adduction (seated): right 4+/5, left 5/5, Knee Extension: right 5/5, left 5/5, Knee Flexion: right 4-/5, left 4+/5     2. Pt will improve LEFS score to 31 to demonstrate improvement in patient's ability to perform unrestricted ADLs/IADLs at home & community. (Initial LTG)              Current: regressed, 14/80               NOT MET; 12/80 points     3. Pt will report compliance with home HEP at end of care to enhance carry over of fuctional gains made with skilled therapy services in order to improve quality of life. (Initial LTG)               RC: progressing, compliant with initial HEP 1x/day, updated today               PROGRESSING; reports daily compliance, update as appropriate      4. Pt  will improve TUG score to at least 26 seconds with FWW to demonstrate a decrease in patients falls risk. (Initial LTG)               RC: regressed, 39 sec with FWW               NOT MET; regression to 48 seconds with FWW with left knee pain.     5. Pt will improve her 5 time sit to stand time to least 23 seconds with UE to demonstrate improvement in her functional LE strength & decrease her risk for falls at home.               RC: regressed, 48 sec with UE support, cuing to scoot forward in seat and improve anterior WS   PROGRESSING; 39 seconds with bilateral UE support     6. Pt will be able to improve strength  in bilateral biceps/triceps to at least 4+/5 to improve patient's ability to use UE for support during functional mobility at home & community. (Initial LTG)               RC: biceps: 4+/5 B, triceps: 4+/5 B              Updated goal: 5/5              MET; biceps 5/5 bilaterally, triceps 5/5 bilaterally    Key Functional Changes/Progress:     Functional Gains: exercises at home, standing tolerance  Functional Deficits: standing/walking tolerance, stability, fear of falls, balance, postural awareness, lifting objects from the floor, getting in/out car, lifting right LE up, bending right knee  % improvement: 25%  Pain   Average: 7/10                  Best: 6/10                Worst: 8/10  Patient Goal: be able to walk with my head up and be able to pick up stuff without thinking I'm going to fall'    Problem List: pain affecting function, decrease ROM, decrease strength, edema affection function, impaired gait/balance, decrease ADL/functional abilities, decrease activity tolerance, decrease flexibility/joint mobility, and decrease transfer abilities    Treatment Plan may include any combination of the following: 02889 Therapeutic Exercise, 97112 Neuromuscular Re-Education, 97140 Manual Therapy, 97530 Therapeutic Activity, 97535 Self Care/Home Management, and 97116 Gait Training    Primary mitigating factor  which impeded progress:  None of these apply from preset list (refer to note for other details)    Patient Goal(s) has been updated and includes: be able to walk with my head up and be able to pick up stuff without thinking I'm going to fall'    Goals for this certification period include and are to be achieved in   5  weeks:     1 Pt will be able to improve strength in Hip flexion, seated hip ER/Adduction; knee extension/flexion to at least 5/5 to improve patient's ability to get in/out of chair with efficiency at home & community. (Initial LTG)                RC: NOT MET; Hip Flexion: right 4/5, left 4+/5, Hip ER: right 4/5, left 4/5, Hip Adduction (seated): right 4+/5, left 5/5, Knee Extension: right 5/5, left 5/5, Knee Flexion: right 4-/5, left 4+/5     2. Pt will improve LEFS score to 31 to demonstrate improvement in patient's ability to perform unrestricted ADLs/IADLs at home & community. (Initial LTG)               RC: NOT MET; 12/80 points     3. Pt will report compliance with home HEP at end of care to enhance carry over of fuctional gains made with skilled therapy services in order to improve quality of life. (Initial LTG)                RC: PROGRESSING; reports daily compliance, update as appropriate      4. Pt will improve TUG score to at least 26 seconds with FWW to demonstrate a decrease in patients falls risk. (Initial LTG)               RC: NOT MET; regression to 48 seconds with FWW with left knee pain.     5. Pt will improve her 5 time sit to stand time  to least 23 seconds with UE to demonstrate improvement in her functional LE strength & decrease her risk for falls at home.   RC: PROGRESSING; 39 seconds with bilateral UE support    Frequency / Duration:   Patient to be seen   1-2   times per week for   5    weeks:  Frequency / Duration: Patient would benefit from skilled PT 1-2 times per week for 10 visits (2 times per week/30 day certification) sessions as needed in this certification period.   Goals will be assigned and reassessed every 10 visits/ 30 days per Medicare guidelines    Assessments/Recommendations:     Ms. Adler self-reports 25% improvement since beginning PT with slow, but steady progress. Pt reports functional gains that include: exercises at home, standing tolerance. Pt reports functional deficits that include: standing/walking tolerance, stability, fear of falls, balance, postural awareness, lifting objects from the floor, getting in/out car, lifting right LE up, bending right knee. Pt reports a recent average pain of 7/10, 8/10 at worst, and 6/10 at best. Objectively pt is making progress with her UE and LE strength per MMT testing, but some deficits remains in bilateral LE (right > left). Remains a high falls risk per 5x STS testing and TUG time. She continues to have lumbar flexion and downward gaze with ambulation. Reports continued difficulty with getting in/out of the car which according to her as gotten a little more difficult recently due to challenges with flexing right hip to raise leg. Pt would benefit from continued skilled PT to address remaining functional deficits. We will continue with PT at 2x/wk for 5 weeks.     If you have any questions/comments please contact us  directly at [phone].   Thank you for allowing us  to assist in the care of your patient.    Certification Period: 06/02/24 - 07/01/24  Reporting Period (date from last assessment to current assessment): 05/06/24 - 06/02/24  Next PN/RC: 07/01/24    Fonda Shay, PT       06/02/2024       2:40 PM      ___ I have read the above report and request that my patient continue as recommended.   ___ I have read the above report and request that my patient continue therapy with the following changes/special instructions: ________________________________________________   ___ I have read the above report and request that my patient be discharged from therapy.     Physician's Signature:_________________________    DATE:_________   TIME:________                           Ike Olam BIRCH, PA    ** Signature, Date and Time must be completed for valid certification **  Please sign and return to [facility] or you may fax the signed copy to [phone]  Thank you.      "

## 2024-06-02 NOTE — Progress Notes (Signed)
 "PHYSICAL / OCCUPATIONAL THERAPY - DAILY TREATMENT NOTE    Patient Name: [name] Short    Date: 06/02/2024    DOB: 1955-09-12  Insurance: [place] / Plan: [place] [employer] [employer] [employer] [place] / Product Type: *No Product type* /      Patient DOB verified Yes     Visit #   Current / Total 7 10   Time   In / Out 1204 1249   Pain   In / Out 7 7   Subjective Functional Status/Changes: I'm hurting today.     TREATMENT AREA =  Decreased strength  Loss of balance    OBJECTIVE         Therapeutic Procedures:    Tx Min Billable or 1:1 Min (if diff from Tx Min) Procedure, Rationale, Specifics   10 10 97110 Therapeutic Exercise (timed):  increase ROM, strength, coordination, balance, and proprioception to improve patient's ability to progress to PLOF and address remaining functional goals. (see flow sheet as applicable)     Details if applicable:       25 25 97530 Therapeutic Activity (timed):  use of dynamic activities replicating functional movements to increase ROM, strength, coordination, balance, and proprioception in order to improve patient's ability to progress to PLOF and address remaining functional goals.  (see flow sheet as applicable)     Details if applicable:     10 10 97535 Self Care/Home Management (timed):  improve patient knowledge and understanding of home injury/symptom/pain management, positioning, posture/ergonomics, home safety, activity modification, transfer techniques, and joint protection strategies  to improve patient's ability to progress to PLOF and address remaining functional goals.  (see flow sheet as applicable)     Details if applicable:     45 45 MC BC Totals Reminder: bill using total billable min of TIMED therapeutic procedures (example: do not include dry needle or estim unattended, both untimed codes, in totals to left)  8-22 min = 1 unit; 23-37 min = 2 units; 38-52 min = 3 units; 53-67 min = 4 units; 68-82 min = 5 units   Total Total     Charge Capture    [x]   Patient  Education billed concurrently with other procedures   [x]  Review HEP    []  Progressed/Changed HEP, detail:    []  Other detail:       Objective Information/Functional Measures/Assessment    LEFS 12    TUG = 48 seconds    5x STS = 39 seconds    MMT Left Right   Hip Flexion  4+/5  4/5   Hip Adduction (seated)  5/5  4+/5   Hip ER  4/5  4/5     MMT  Left Right   Knee Flexion  4+/5  4-/5   Knee Extension  5/5  5/5      MMT  Left Right   Elbow Flexion  5/5   5/5   Elbow Extension  5/5   5/5     [name] self-reports 25% improvement since beginning PT with slow, but steady progress. Pt reports functional gains that include: exercises at home, standing tolerance. Pt reports functional deficits that include: standing/walking tolerance, stability, fear of falls, balance, postural awareness, lifting objects from the floor, getting in/out car, lifting right LE up, bending right knee. Pt reports a recent average pain of 7/10, 8/10 at worst, and 6/10 at best. Objectively pt is making progress with her UE and LE strength per MMT testing, but some  deficits remains in bilateral LE (right > left). Remains a high falls risk per 5x STS testing and TUG time. She continues to have lumbar flexion and downward gaze with ambulation. Reports continued difficulty with getting in/out of the car which according to her as gotten a little more difficult recently due to challenges with flexing right hip to raise leg. Pt would benefit from continued skilled PT to address remaining functional deficits. We will continue with PT at 2x/wk for 5 weeks.      Patient will continue to benefit from skilled PT / OT services to modify and progress therapeutic interventions, analyze and address functional mobility deficits, analyze and address ROM deficits, analyze and address strength deficits, analyze and address soft tissue restrictions, analyze and cue for proper movement patterns, analyze and modify for postural abnormalities, analyze and address  imbalance/dizziness, and instruct in home and community integration to address functional deficits and attain remaining goals.    Progress toward goals / Updated goals:  []   See Progress Note/Recertification     1 Pt will be able to improve strength in Hip flexion, seated hip ER/Adduction; knee extension/flexion to at least 5/5 to improve patient's ability to get in/out of chair with efficiency at home & community. (Initial LTG)               RC: progressing, hip flex = R 4/5, L 4+/5; hip ER = R 4/5, L 4+/5; hip add = R 5/5, L 5/5; knee ext = R 4/5, L 5/5; knee flex = R 4/5, L 4+/5    NOT MET; Hip Flexion: right 4/5, left 4+/5, Hip ER: right 4/5, left 4/5, Hip Adduction (seated): right 4+/5, left 5/5, Knee Extension: right 5/5, left 5/5, Knee Flexion: right 4-/5, left 4+/5    2. Pt will improve LEFS score to 31 to demonstrate improvement in patient's ability to perform unrestricted ADLs/IADLs at home & community. (Initial LTG)              Current: regressed, 14/80    NOT MET; 12/80 points    3. Pt will report compliance with home HEP at end of care to enhance carry over of fuctional gains made with skilled therapy services in order to improve quality of life. (Initial LTG)               RC: progressing, compliant with initial HEP 1x/day, updated today    PROGRESSING; reports daily compliance, update as appropriate     4. Pt will improve TUG score to at least 26 seconds with FWW to demonstrate a decrease in patients falls risk. (Initial LTG)               RC: regressed, 39 sec with FWW    NOT MET; regression to 48 seconds with FWW with left knee pain.    5. Pt will improve her 5 time sit to stand time to least 23 seconds with UE to demonstrate improvement in her functional LE strength & decrease her risk for falls at home.               RC: regressed, 48 sec with UE support, cuing to scoot forward in seat and improve anterior WS   PROGRESSING; 39 seconds with bilateral UE support     6. Pt will be able to improve  strength in bilateral biceps/triceps to at least 4+/5 to improve patient's ability to use UE for support during functional mobility at home & community. (Initial LTG)  RC: biceps: 4+/5 B, triceps: 4+/5 B              Updated goal: 5/5   MET; biceps 5/5 bilaterally, triceps 5/5 bilaterally    Functional Gains: exercises at home, standing tolerance  Functional Deficits: standing/walking tolerance, stability, fear of falls, balance, postural awareness, lifting objects from the floor, getting in/out car, lifting right LE up, bending right knee  % improvement: 25%  Pain   Average: 7/10       Best: 6/10     Worst: 8/10  Patient Goal: be able to walk with my head up and be able to pick up stuff without thinking I'm going to fall'    Next PN 07/01/2024  [facility] or Medicaid tracking due 07/01/2024  Auth due (visit number/ date) 11 visits remaining by 08/03/2024    PLAN  - Continue Plan of Care    Dorise Ni, PTA, CSCS    06/02/2024    8:38 AM  If an interpreting service was utilized for treatment of this patient, the contents of this document represent the material reviewed with the patient via the interpreter.     Future Appointments   Date Time Provider Department Center   06/02/2024 12:10 PM Ni Dorise, PT [facility]   06/07/2024 12:10 PM Reather Chew, [place] [facility]         "

## 2024-06-06 ENCOUNTER — Other Ambulatory Visit: Payer: Self-pay

## 2024-06-06 DIAGNOSIS — I5042 Chronic combined systolic (congestive) and diastolic (congestive) heart failure: Secondary | ICD-10-CM

## 2024-06-06 MED ORDER — FUROSEMIDE 40 MG PO TABS
40.0000 mg | ORAL_TABLET | Freq: Two times a day (BID) | ORAL | 3 refills | Status: AC
  Filled 2024-09-06 (×2): qty 90, 45d supply, fill #0

## 2024-06-07 ENCOUNTER — Inpatient Hospital Stay: Admit: 2024-06-07 | Payer: Medicare (Managed Care) | Primary: Physician Assistant

## 2024-06-07 DIAGNOSIS — R531 Weakness: Principal | ICD-10-CM

## 2024-06-07 NOTE — Progress Notes (Signed)
 "PHYSICAL / OCCUPATIONAL THERAPY - DAILY TREATMENT NOTE    Patient Name: [name] Short    Date: 06/07/2024    DOB: 1956/01/18  Insurance: [place] / Plan: [place] [employer] [employer] [employer] [place] / Product Type: *No Product type* /      Patient DOB verified Yes     Visit #   Current / Total 1 10   Time   In / Out 1212 1255   Pain   In / Out 8 7   Subjective Functional Status/Changes: States that her pain is elevated today especially in the knee.      TREATMENT AREA =  Decreased strength  Loss of balance    OBJECTIVE         Therapeutic Procedures:    Tx Min Billable or 1:1 Min (if diff from Tx Min) Procedure, Rationale, Specifics   23 23 97110 Therapeutic Exercise (timed):  increase ROM, strength, coordination, balance, and proprioception to improve patient's ability to progress to PLOF and address remaining functional goals. (see flow sheet as applicable)     Details if applicable:       20 20 97530 Therapeutic Activity (timed):  use of dynamic activities replicating functional movements to increase ROM, strength, coordination, balance, and proprioception in order to improve patient's ability to progress to PLOF and address remaining functional goals.  (see flow sheet as applicable)     Details if applicable:     43 43 MC BC Totals Reminder: bill using total billable min of TIMED therapeutic procedures (example: do not include dry needle or estim unattended, both untimed codes, in totals to left)  8-22 min = 1 unit; 23-37 min = 2 units; 38-52 min = 3 units; 53-67 min = 4 units; 68-82 min = 5 units   Total Total     Charge Capture    [x]   Patient Education billed concurrently with other procedures   [x]  Review HEP    []  Progressed/Changed HEP, detail:    []  Other detail:       Objective Information/Functional Measures/Assessment    Pt tolerating session well, cues provided to ensure more upright posturing during exercises today. Adding in medicine ball pres 4.4# out and up to further improve UE strength  and postural control with fair return. Continued emphasis placed on CKC strengthening with step up however continues to perform on 6inch step to ensure proper form and minimize compensation during task. 2# weight performed for further quad strengthening. Continue to progress patient to tolerance.     Patient will continue to benefit from skilled PT / OT services to modify and progress therapeutic interventions, analyze and address functional mobility deficits, analyze and address ROM deficits, analyze and address strength deficits, analyze and address soft tissue restrictions, analyze and cue for proper movement patterns, analyze and modify for postural abnormalities, analyze and address imbalance/dizziness, and instruct in home and community integration to address functional deficits and attain remaining goals.    Progress toward goals / Updated goals:  []   See Progress Note/Recertification    1 Pt will be able to improve strength in Hip flexion, seated hip ER/Adduction; knee extension/flexion to at least 5/5 to improve patient's ability to get in/out of chair with efficiency at home & community. (Initial LTG)                RC: NOT MET; Hip Flexion: right 4/5, left 4+/5, Hip ER: right 4/5, left 4/5, Hip Adduction (seated): right 4+/5, left 5/5,  Knee Extension: right 5/5, left 5/5, Knee Flexion: right 4-/5, left 4+/5     2. Pt will improve LEFS score to 31 to demonstrate improvement in patient's ability to perform unrestricted ADLs/IADLs at home & community. (Initial LTG)               RC: NOT MET; 12/80 points   Current: reassess at next 30 days [Date assessed: 06/07/24]     3. Pt will report compliance with home HEP at end of care to enhance carry over of fuctional gains made with skilled therapy services in order to improve quality of life. (Initial LTG)                RC: PROGRESSING; reports daily compliance, update as appropriate      4. Pt will improve TUG score to at least 26 seconds with FWW to demonstrate  a decrease in patients falls risk. (Initial LTG)               RC: NOT MET; regression to 48 seconds with FWW with left knee pain.     5. Pt will improve her 5 time sit to stand time to least 23 seconds with UE to demonstrate improvement in her functional LE strength & decrease her risk for falls at home.   RC: PROGRESSING; 39 seconds with bilateral UE support    Next PN 07/01/24  RC or Medicaid tracking due 07/01/24  Auth due (visit number/ date) 10 more til 08/03/24    PLAN  - Continue Plan of Care    Delia, [place]    06/07/2024    1:05 PM  If an interpreting service was utilized for treatment of this patient, the contents of this document represent the material reviewed with the patient via the interpreter.     Future Appointments   Date Time Provider Department Center   06/09/2024 12:10 PM Arlis Ozell H[place] [facility]   06/13/2024 12:10 PM [facility] PT [address] [facility]   06/16/2024 12:10 PM Rush Ivy, PT [facility]   06/21/2024 12:10 PM Reather Chew, [place] [facility]   06/23/2024 12:10 PM Arlis Ozell HACKNEY [facility]   06/28/2024 12:10 PM Rex, Megan, PTA [facility]         "

## 2024-06-09 ENCOUNTER — Inpatient Hospital Stay: Admit: 2024-06-09 | Payer: Medicare (Managed Care) | Primary: Physician Assistant

## 2024-06-09 NOTE — Progress Notes (Addendum)
 "PHYSICAL / OCCUPATIONAL THERAPY - DAILY TREATMENT NOTE    Patient Name: [name] Short    Date: 06/09/2024    DOB: [birth year]  Ins[place]: [place] / Plan: [place] [employer] [employer] DUAL / Product Type: *No Product type* /      Patient DOB verified Yes     Visit #   Current / Total 2 10   Time   In / Out 1215 1255   Pain   In / Out 8 7   Subjective Functional Status/Changes: Pt reports PT is helping with the pain.     TREATMENT AREA =  Decreased strength  Loss of balance    OBJECTIVE         Therapeutic Procedures:    Tx Min Billable or 1:1 Min (if diff from Tx Min) Procedure, Rationale, Specifics   23 23 [id number] Therapeutic Exercise (timed):  increase ROM, strength, coordination, balance, and proprioception to improve patient's ability to progress to PLOF and address remaining functional goals. (see flow sheet as applicable)     Details if applicable:       17 17 [id number] Therapeutic Activity (timed):  use of dynamic activities replicating functional movements to increase ROM, strength, coordination, balance, and proprioception in order to improve patient's ability to progress to PLOF and address remaining functional goals.  (see flow sheet as applicable)     Details if applicable:            Details if applicable:            Details if applicable:            Details if applicable:     40 40 MC BC Totals Reminder: bill using total billable min of TIMED therapeutic procedures (example: do not include dry needle or estim unattended, both untimed codes, in totals to left)  8-22 min = 1 unit; 23-37 min = 2 units; 38-52 min = 3 units; 53-67 min = 4 units; 68-82 min = 5 units   Total Total     Charge Capture    [x]   Patient Education billed concurrently with other procedures   [x]  Review HEP    []  Progressed/Changed HEP, detail:    []  Other detail:       Objective Information/Functional Measures/Assessment    Pt reports no adverse effect from treatment session.Pt was able to perform step up with better  upright postural and decrease rest breaks at this time.Pt reports ambulating more at home due to PT intervention.Pt continues to be well challenged with LAQ with 2# today. Pt was able to increase reps with 4.4 # ball press ups/outs with good return.Reviewed HEP and Pt was able to perform demo.Progress as able.      Patient will continue to benefit from skilled PT / OT services to modify and progress therapeutic interventions, analyze and address functional mobility deficits, analyze and address ROM deficits, analyze and address strength deficits, analyze and address soft tissue restrictions, analyze and cue for proper movement patterns, analyze and modify for postural abnormalities, analyze and address imbalance/dizziness, and instruct in home and community integration to address functional deficits and attain remaining goals.    Progress toward goals / Updated goals:  []   See Progress Note/Recertification    1 Pt will be able to improve strength in Hip flexion, seated hip ER/Adduction; knee extension/flexion to at least 5/5 to improve patient's ability to get in/out of chair with efficiency at home & community. (Initial LTG)  RC: NOT MET; Hip Flexion: right 4/5, left 4+/5, Hip ER: right 4/5, left 4/5, Hip Adduction (seated): right 4+/5, left 5/5, Knee Extension: right 5/5, left 5/5, Knee Flexion: right 4-/5, left 4+/5     2. Pt will improve LEFS score to 31 to demonstrate improvement in patient's ability to perform unrestricted ADLs/IADLs at home & community. (Initial LTG)               RC: NOT MET; 12/80 points              Current: reassess at next 30 days [Date assessed: 06/07/24]     3. Pt will report compliance with home HEP at end of care to enhance carry over of fuctional gains made with skilled therapy services in order to improve quality of life. (Initial LTG)                RC: PROGRESSING; reports daily compliance, update as appropriate      4. Pt will improve TUG score to at least 26  seconds with FWW to demonstrate a decrease in patients falls risk. (Initial LTG)               RC: NOT MET; regression to 48 seconds with FWW with left knee pain.     5. Pt will improve her 5 time sit to stand time to least 23 seconds with UE to demonstrate improvement in her functional LE strength & decrease her risk for falls at home.   RC: PROGRESSING; 39 seconds with bilateral UE support    Next PN 07/01/24  RC or Medicaid tracking due 07/01/24  Auth due (visit number/ date) 9 more till 08/03/24    PLAN  - Continue Plan of Care    Ozell Low, PTA    06/09/2024    12:59 PM  If an interpreting service was utilized for treatment of this patient, the contents of this document represent the material reviewed with the patient via the interpreter.     Future Appointments   Date Time Provider Department Center   06/13/2024 12:10 PM [facility] PT [address] [facility]   06/16/2024 12:10 PM Rush Ivy, PT [facility]   06/21/2024 12:10 PM Reather Chew, [place] [facility]   06/23/2024 12:10 PM Low Ozell HACKNEY [facility]   06/28/2024 12:10 PM Rex, Megan, PTA [facility]        "

## 2024-06-13 ENCOUNTER — Emergency Department: Admit: 2024-06-14 | Payer: Medicare (Managed Care) | Primary: Physician Assistant

## 2024-06-13 ENCOUNTER — Inpatient Hospital Stay: Payer: Medicare (Managed Care) | Primary: Physician Assistant

## 2024-06-13 DIAGNOSIS — M7989 Other specified soft tissue disorders: Principal | ICD-10-CM

## 2024-06-13 DIAGNOSIS — R600 Localized edema: Secondary | ICD-10-CM

## 2024-06-13 NOTE — ED Triage Notes (Signed)
"  Pt to ED c/o R ankle pain and bilateral leg swelling. Per daughter, pt mobility is decreasing and not as good as it was prior to her stroke, pt is in PT.   "

## 2024-06-13 NOTE — ED Provider Notes (Signed)
 " EMERGENCY DEPARTMENT HISTORY AND PHYSICAL EXAM    Date: 06/13/2024  Patient Name: [name] Short    History of Presenting Illness     Chief Complaint   Patient presents with    Leg Swelling       History Provided By: Patient     History [name] Short):   [name] Short is a 68 y.o. female with a PMHX of anemia, diabetes, hyperlipidemia, stroke, CHF who presents to the emergency department  by POV. Patient states over the past couple days swelling in her legs has gotten progressively worse.  Patient endorses a history of lymphedema in the right leg.  Patient denies fever, chest pain, shortness of breath, syncope, nausea, vomiting.  Patient states she has been wearing her compression stockings and taking her 40 mg Lasix  dose daily as prescribed.  Additionally patient feels as if her skin is darker than normal.    PCP: Ike Olam BIRCH, PA    No current facility-administered medications for this encounter.     Current Outpatient Medications   Medication Sig Dispense Refill    Probiotic Product (PROBIOTIC BLEND PO) Take by mouth Daily      famotidine  (PEPCID ) 20 MG tablet TAKE 1 TABLET BY MOUTH ONCE A DAY FOR GERD      vitamin C (ASCORBIC ACID) 500 MG tablet Take 1 tablet by mouth daily      insulin  glargine (LANTUS ) 100 UNIT/ML injection vial Inject 10 Units into the skin daily 1 each 0    insulin  lispro (HUMALOG ) 100 UNIT/ML SOLN injection vial Inject 3 Units into the skin 3 times daily (with meals) 1 each 0    hydrALAZINE  (APRESOLINE ) 25 MG tablet Take 1 tablet by mouth every 8 hours 90 tablet 3    potassium chloride  (KLOR-CON  M) 10 MEQ extended release tablet Take 1 tablet by mouth daily      cinacalcet  (SENSIPAR ) 30 MG tablet Take 1 tablet by mouth 2 times daily      carvedilol  (COREG ) 6.25 MG tablet Take 1 tablet by mouth 2 times daily (with meals) 60 tablet 3    baclofen  (LIORESAL ) 10 MG tablet Take 1 tablet by mouth 3 times daily as needed (PAIN) Indications: Pain FOR PAIN PER SHETH      Magnesium  Oxide 400 MG CAPS Take 1  capsule by mouth daily.      aspirin  325 MG tablet Take 1 tablet by mouth daily      atorvastatin  (LIPITOR ) 80 MG tablet Take 1 tablet by mouth nightly 30 tablet 3    ferrous sulfate  (IRON  325) 325 (65 Fe) MG tablet Take 1 tablet by mouth daily 30 tablet     Multiple Vitamins-Minerals (THERAPEUTIC MULTIVITAMIN-MINERALS) tablet Take 1 tablet by mouth daily      furosemide  (LASIX ) 40 MG tablet furosemide  40 mg tablet   TAKE 1 TABLET BY MOUTH EVERY DAY         Past History     Past Medical History:  Past Medical History:   Diagnosis Date    Anemia     Arthritis     Chronic pain     Legs and Shoulders    Diabetes (HCC)     Exposure to asbestos     History of blood transfusion     History of colon polyps     HTN (hypertension)     Hx of blood clots     DVT    Hyperlipemia     Menopause  Pulmonary nodule     Serum calcium  elevated     Sleep apnea     not using cpap    Stroke (HCC) 12/2021    left sided weakness       Past Surgical History:  Past Surgical History:   Procedure Laterality Date    BACK SURGERY N/A 06/24/2022    INCISION AND DRAINAGE /DEBRIDEMENT SACRAL DECUBITUS performed by Reda Garre, DO at [facility] MAIN OR    CESAREAN SECTION  1987    COLONOSCOPY N/A 01/13/2017    COLONOSCOPY performed by Debby Dun, MD at [facility] ENDOSCOPY    COLONOSCOPY      COLONOSCOPY N/A 04/08/2023    COLONOSCOPY DIAGNOSTIC performed by Gerlean Norleen Anes, MD at [facility] ENDOSCOPY    DILATION AND CURETTAGE OF UTERUS  1995    RECTAL SURGERY N/A 12/28/2021    SACRAL WOUND DEBRIDEMENT INCISION AND DRAINAGE performed by Garre Reda, DO at [facility] MAIN OR    SPINE SURGERY N/A 12/09/2021    CERVICAL THREE/FOUR/FIVE/SIX LAMINECTOMY FUSION; C-ARM; STRYKER; EXT BONE GROWTH STIM; 23 HR performed by Anes KATHEE March, MD at [facility] MAIN OR       Family History:  Family History   Problem Relation Age of Onset    Diabetes Other 33        parent, NOS    Cancer Father     Hypertension Other 35        parent,NOS    Heart Disease Other 54        parent, NOS     Lung Disease Father        Social History:  Social History     Tobacco Use    Smoking status: Former     Current packs/day: 1.00     Average packs/day: 1 pack/day for 34.2 years (34.2 ttl pk-yrs)     Types: Cigarettes, Cigars     Start date: 04/12/1990    Smokeless tobacco: Never   Vaping Use    Vaping status: Never Used   Substance Use Topics    Alcohol use: Not Currently     Comment: occ    Drug use: Never       Allergies:  Allergies   Allergen Reactions    Lactose Other (See Comments)     Intolerance       Review of Systems   Review of Systems  All Other Systems Negative  Physical Exam     Vitals:    06/13/24 2213   BP: (!) 158/101   Pulse: 68   Resp: 20   Temp: 98.8 F (37.1 C)   TempSrc: Oral   SpO2: 100%   Weight: 69.4 kg (153 lb)   Height: 1.499 m (4' 11)     Physical Exam  Vitals and nursing note reviewed.   Constitutional:       General: She is not in acute distress.     Appearance: Normal appearance. She is not ill-appearing, toxic-appearing or diaphoretic.   HENT:      Head: Normocephalic and atraumatic.      Right Ear: Tympanic membrane and ear canal normal.      Left Ear: Tympanic membrane and ear canal normal.      Nose: Congestion present.      Mouth/Throat:      Mouth: Mucous membranes are moist.   Eyes:      General: No scleral icterus.        Right eye: No discharge.  Left eye: No discharge.      Extraocular Movements: Extraocular movements intact.      Conjunctiva/sclera: Conjunctivae normal.      Pupils: Pupils are equal, round, and reactive to light.   Cardiovascular:      Rate and Rhythm: Normal rate and regular rhythm.      Heart sounds: No murmur heard.  Pulmonary:      Effort: Pulmonary effort is normal. No respiratory distress.      Breath sounds: Normal breath sounds. No stridor. No wheezing or rhonchi.   Musculoskeletal:         General: Swelling and tenderness present. No deformity or signs of injury. Normal range of motion.      Cervical back: Normal range of motion and neck  supple.      Right lower leg: Edema present.      Left lower leg: Edema present.      Comments: Physical exam at this time remarkable for 2+ bilateral pitting edema.  There is some slight darkening of the skin bilaterally however no obvious stasis dermatitis no scaling tibial and pedal pulses 2+ bilaterally sensation intact full range of motion.  There is significant warmth to either extremity there is no significant swelling or cellulitis.   Skin:     General: Skin is warm and dry.      Findings: No rash.   Neurological:      General: No focal deficit present.      Mental Status: She is alert and oriented to person, place, and time. Mental status is at baseline.      Motor: No weakness.      Coordination: Coordination normal.      Comments: No focal neurological deficit noted. No facial droop, slurred speech, or evidence of altered mentation noted on exam.     Psychiatric:         Mood and Affect: Mood normal.         Behavior: Behavior normal.         Thought Content: Thought content normal.       Diagnostic Study Results     Labs -     Recent Results (from the past 12 hours)   CBC with Auto Differential    Collection Time: 06/13/24 10:50 PM   Result Value Ref Range    WBC 4.7 4.6 - 13.2 K/uL    RBC 4.95 4.20 - 5.30 M/uL    Hemoglobin 12.7 12.0 - 16.0 g/dL    Hematocrit 59.7 64.9 - 45.0 %    MCV 81.2 78.0 - 100.0 FL    MCH 25.7 24.0 - 34.0 PG    MCHC 31.6 31.0 - 37.0 g/dL    RDW 86.3 88.3 - 85.4 %    Platelets 178 135 - 420 K/uL    MPV 10.1 9.2 - 11.8 FL    Nucleated RBCs 0.0 0 PER 100 WBC    nRBC 0.00 0.00 - 0.01 K/uL    Neutrophils % 66.8 40.0 - 73.0 %    Lymphocytes % 18.3 (L) 21.0 - 52.0 %    Monocytes % 12.6 (H) 3.0 - 10.0 %    Eosinophils % 1.7 0.0 - 5.0 %    Basophils % 0.4 0.0 - 2.0 %    Immature Granulocytes % 0.2 0.0 - 0.5 %    Neutrophils Absolute 3.13 1.80 - 8.00 K/UL    Lymphocytes Absolute 0.86 (L) 0.90 - 3.60 K/UL    Monocytes Absolute 0.59 0.05 -  1.20 K/UL    Eosinophils Absolute 0.08 0.00 - 0.40  K/UL    Basophils Absolute 0.02 0.00 - 0.10 K/UL    Immature Granulocytes Absolute 0.01 0.00 - 0.04 K/UL    Differential Type AUTOMATED     Comprehensive Metabolic Panel    Collection Time: 06/13/24 10:50 PM   Result Value Ref Range    Sodium 146 (H) 136 - 145 mmol/L    Potassium 4.8 3.5 - 5.5 mmol/L    Chloride 107 98 - 107 mmol/L    CO2 32 21 - 32 mmol/L    Anion Gap 7 3.0 - 18.0 mmol/L    Glucose 215 (H) 74 - 108 mg/dL    BUN 24 (H) 6 - 23 MG/DL    Creatinine 9.06 0.6 - 1.3 MG/DL    BUN/Creatinine Ratio 26 (H) 12 - 20      Est, Glom Filt Rate 68 >60 ml/min/1.80m2    Calcium  10.6 (H) 8.6 - 10.2 MG/DL    Total Bilirubin 0.2 0.2 - 1.0 MG/DL    ALT 821 (H) 10 - 35 U/L    AST 102 (H) 10 - 38 U/L    Alk Phosphatase 165 (H) 45 - 117 U/L    Total Protein 6.4 6.4 - 8.2 g/dL    Albumin 3.6 3.4 - 5.0 g/dL    Globulin 2.8 2.0 - 4.0 g/dL    Albumin/Globulin Ratio 1.3 0.8 - 1.7     Brain Natriuretic Peptide    Collection Time: 06/13/24 10:50 PM   Result Value Ref Range    NT Pro-BNP 290 36 - 900 PG/ML       Radiologic Studies -   XR CHEST PORTABLE   Final Result      No acute pulmonary disease.      Electronically signed by Tinnie Bowen        @CT48 @  @CXR48 @  Procedures     Procedures    ED Course     12:45 AM EST I Signe Baptist am the first provider for this patient. Initial assessment performed. I reviewed the vital signs, available nursing notes, past medical history, past surgical history, family history and social history. The patients presenting problems have been discussed, and they are in agreement with the care plan formulated and outlined with them.  I have encouraged them to ask questions as they arise throughout their visit.    Records Reviewed: Nursing Notes, Old Medical Records, Previous Radiology Studies, and Previous Laboratory Studies    Is this patient to be included in the SEP-1 core measure? No Exclusion criteria - the patient is NOT to be included for SEP-1 Core Measure due to: Infection is not  suspected    MEDICATIONS ADMINISTERED IN THE ED:  Medications - No data to display    MED RECONCILIATION:  No current facility-administered medications for this encounter.     Current Outpatient Medications   Medication Sig    Probiotic Product (PROBIOTIC BLEND PO) Take by mouth Daily    famotidine  (PEPCID ) 20 MG tablet TAKE 1 TABLET BY MOUTH ONCE A DAY FOR GERD    vitamin C (ASCORBIC ACID) 500 MG tablet Take 1 tablet by mouth daily    insulin  glargine (LANTUS ) 100 UNIT/ML injection vial Inject 10 Units into the skin daily    insulin  lispro (HUMALOG ) 100 UNIT/ML SOLN injection vial Inject 3 Units into the skin 3 times daily (with meals)    hydrALAZINE  (APRESOLINE ) 25 MG tablet Take 1 tablet by  mouth every 8 hours    potassium chloride  (KLOR-CON  M) 10 MEQ extended release tablet Take 1 tablet by mouth daily    cinacalcet  (SENSIPAR ) 30 MG tablet Take 1 tablet by mouth 2 times daily    carvedilol  (COREG ) 6.25 MG tablet Take 1 tablet by mouth 2 times daily (with meals)    baclofen  (LIORESAL ) 10 MG tablet Take 1 tablet by mouth 3 times daily as needed (PAIN) Indications: Pain FOR PAIN PER SHETH    Magnesium  Oxide 400 MG CAPS Take 1 capsule by mouth daily.    aspirin  325 MG tablet Take 1 tablet by mouth daily    atorvastatin  (LIPITOR ) 80 MG tablet Take 1 tablet by mouth nightly    ferrous sulfate  (IRON  325) 325 (65 Fe) MG tablet Take 1 tablet by mouth daily    Multiple Vitamins-Minerals (THERAPEUTIC MULTIVITAMIN-MINERALS) tablet Take 1 tablet by mouth daily    furosemide  (LASIX ) 40 MG tablet furosemide  40 mg tablet   TAKE 1 TABLET BY MOUTH EVERY DAY       Medical Decision Making   Patient overall ANO x 3 at baseline.  Will obtain lab work and imaging for further evaluation of patients complaint. Will continue to monitor and evaluate patient while in the ED.    I reviewed the vital signs, available nursing notes, past medical history, past surgical history, family history and social history.    Vital Signs-Reviewed the  patient's vital signs.    ED Course as of 06/14/24 0046   Mon Jun 13, 2024   2300 Initial patient pression at this time.  Patient states over the past couple days swelling in her legs has gotten progressively worse.  Patient endorses a history of lymphedema in the right leg.  Patient denies fever, chest pain, shortness of breath, syncope, nausea, vomiting.  Patient states she has been wearing her compression stockings and taking her 40 mg Lasix  dose daily as prescribed.  Additionally patient feels as if her skin is darker than normal. [CH]   2328 Physical exam at this time remarkable for 2+ bilateral pitting edema.  There is some slight darkening of the skin bilaterally however no obvious stasis dermatitis no scaling tibial and pedal pulses 2+ bilaterally sensation intact full range of motion.  There is significant warmth to either extremity there is no significant swelling or cellulitis. [CH]   2329 At this time order CBC CMP BNP as well as chest x-ray for possible CHF exacerbation. [CH]   2329 CBC rather reassuring no leukocytosis no anemia [CH]   2346 CMP grossly within normal what is for the patient based on previous lab results.  proBNP negative at 290 [CH]   2347 Workup in close no left shift or leukocytosis no anemia BNP is 290 no significant abnormality of the electrolytes per patient's normal limits no significant renal dysfunction reassuring workup up to this point.  Pending chest x-ray. [CH]   Tue Jun 14, 2024   0023 Chest x-ray per radiological interpretation shows  IMPRESSION:     No acute pulmonary disease.   [CH]   0023 Will discuss results with patient dispo accordingly. [CH]   0045 X-ray results discussed with patient at this time.At this time patient stable for discharge vital signs stable upon discharge all patient questions answered thoroughly at this time patient agrees with plan moving forward strict ER return precautions given at this time which patient verbalizes competent understanding for.   Patient's friend member bedside patient's family member questions answered thoroughly  patient's family agrees with plan going forward at this time.  Reemphasized strict ER return precautions to patient family at this time as well [CH]      ED Course User Index  [CH] Ina Freund, PA-C       Dictation disclaimer:  Please note that this dictation was completed with Dragon, the computer voice recognition software.  Quite often unanticipated grammatical, syntax, homophones, and other interpretive errors are inadvertently transcribed by the computer software.  Please disregard these errors.  Please excuse any errors that have escaped final proofreading.      Diagnosis     CLINICAL IMPRESSION:  1. Leg swelling         Medication List        ASK your doctor about these medications      aspirin  325 MG tablet     atorvastatin  80 MG tablet  Commonly known as: LIPITOR   Take 1 tablet by mouth nightly     baclofen  10 MG tablet  Commonly known as: LIORESAL      carvedilol  6.25 MG tablet  Commonly known as: COREG   Take 1 tablet by mouth 2 times daily (with meals)     cinacalcet  30 MG tablet  Commonly known as: SENSIPAR      famotidine  20 MG tablet  Commonly known as: PEPCID      ferrous sulfate  325 (65 Fe) MG tablet  Commonly known as: IRON  325  Take 1 tablet by mouth daily     furosemide  40 MG tablet  Commonly known as: LASIX      hydrALAZINE  25 MG tablet  Commonly known as: APRESOLINE   Take 1 tablet by mouth every 8 hours     insulin  glargine 100 UNIT/ML injection vial  Commonly known as: LANTUS   Inject 10 Units into the skin daily     insulin  lispro 100 UNIT/ML Soln injection vial  Commonly known as: HUMALOG ,ADMELOG   Inject 3 Units into the skin 3 times daily (with meals)     Magnesium  Oxide 400 MG Caps     potassium chloride  10 MEQ extended release tablet  Commonly known as: KLOR-CON  M     PROBIOTIC BLEND PO     therapeutic multivitamin-minerals tablet  Take 1 tablet by mouth daily     vitamin C 500 MG tablet  Commonly known as:  ASCORBIC ACID              Disposition: Discharge    Patient condition at time of disposition: Stable    DISCHARGE NOTE:   Pt has been reexamined. Patient has no new complaints, changes, or physical findings.  Care plan outlined and precautions discussed.  Results were reviewed with the patient. All medications were reviewed with the patient. All of pt's questions and concerns were addressed.  Alarm symptoms and return precautions associated with chief complaint and evaluation were reviewed with the patient in detail.  The patient demonstrated adequate understanding.  Patient was instructed to follow up with PCP, as well as given strict return precautions to the ED upon further deterioration. Patient is ready for discharge home.    [address] [place], [place]  06/14/24 9950    "

## 2024-06-13 NOTE — Telephone Encounter (Signed)
 Lvm for patient to schedule a appointment.

## 2024-06-13 NOTE — Telephone Encounter (Signed)
 Attempt 2 - unable to reach patient.

## 2024-06-14 ENCOUNTER — Inpatient Hospital Stay
Admit: 2024-06-14 | Discharge: 2024-06-14 | Disposition: A | Discharge status: Home / Self Care | Payer: Medicare (Managed Care) | Arrived: VH

## 2024-06-14 LAB — CBC WITH AUTO DIFFERENTIAL
Basophils %: 0.4 % (ref 0.0–2.0)
Basophils Absolute: 0.02 K/UL (ref 0.00–0.10)
Eosinophils %: 1.7 % (ref 0.0–5.0)
Eosinophils Absolute: 0.08 K/UL (ref 0.00–0.40)
Hematocrit: 40.2 % (ref 35.0–45.0)
Hemoglobin: 12.7 g/dL (ref 12.0–16.0)
Immature Granulocytes %: 0.2 % (ref 0.0–0.5)
Immature Granulocytes Absolute: 0.01 K/UL (ref 0.00–0.04)
Lymphocytes %: 18.3 % — ABNORMAL LOW (ref 21.0–52.0)
Lymphocytes Absolute: 0.86 K/UL — ABNORMAL LOW (ref 0.90–3.60)
MCH: 25.7 pg (ref 24.0–34.0)
MCHC: 31.6 g/dL (ref 31.0–37.0)
MCV: 81.2 FL (ref 78.0–100.0)
MPV: 10.1 FL (ref 9.2–11.8)
Monocytes %: 12.6 % — ABNORMAL HIGH (ref 3.0–10.0)
Monocytes Absolute: 0.59 K/UL (ref 0.05–1.20)
Neutrophils %: 66.8 % (ref 40.0–73.0)
Neutrophils Absolute: 3.13 K/UL (ref 1.80–8.00)
Nucleated RBCs: 0 /100{WBCs}
Platelets: 178 K/uL (ref 135–420)
RBC: 4.95 M/uL (ref 4.20–5.30)
RDW: 13.6 % (ref 11.6–14.5)
WBC: 4.7 K/uL (ref 4.6–13.2)
nRBC: 0 K/uL (ref 0.00–0.01)

## 2024-06-14 LAB — COMPREHENSIVE METABOLIC PANEL
ALT: 178 U/L — ABNORMAL HIGH (ref 10–35)
AST: 102 U/L — ABNORMAL HIGH (ref 10–38)
Albumin/Globulin Ratio: 1.3 (ref 0.8–1.7)
Albumin: 3.6 g/dL (ref 3.4–5.0)
Alk Phosphatase: 165 U/L — ABNORMAL HIGH (ref 45–117)
Anion Gap: 7 mmol/L (ref 3.0–18.0)
BUN/Creatinine Ratio: 26 — ABNORMAL HIGH (ref 12–20)
BUN: 24 mg/dL — ABNORMAL HIGH (ref 6–23)
CO2: 32 mmol/L (ref 21–32)
Calcium: 10.6 mg/dL — ABNORMAL HIGH (ref 8.6–10.2)
Chloride: 107 mmol/L (ref 98–107)
Creatinine: 0.93 mg/dL (ref 0.6–1.3)
Est, Glom Filt Rate: 68 ml/min/1.73m2 (ref >60)
Globulin: 2.8 g/dL (ref 2.0–4.0)
Glucose: 215 mg/dL — ABNORMAL HIGH (ref 74–108)
Potassium: 4.8 mmol/L (ref 3.5–5.5)
Sodium: 146 mmol/L — ABNORMAL HIGH (ref 136–145)
Total Bilirubin: 0.2 mg/dL (ref 0.2–1.0)
Total Protein: 6.4 g/dL (ref 6.4–8.2)

## 2024-06-14 LAB — BRAIN NATRIURETIC PEPTIDE: NT Pro-BNP: 290 pg/mL (ref 36–900)

## 2024-06-14 NOTE — Discharge Instructions (Signed)
"  Your evaluated in the emergency department today and are being discharged in stable condition.  Please return to the immediately to the ER if your symptoms worsen, you develop any new concerning symptoms, or you feel your condition is not improving.  Follow-up with your primary care provider as dictated for your ongoing care and monitoring.  Please stay diligent following up with PCP and other medical care providers.  "

## 2024-06-14 NOTE — Telephone Encounter (Signed)
Attempt 3- lvm

## 2024-06-16 ENCOUNTER — Inpatient Hospital Stay: Admit: 2024-06-16 | Payer: Medicare (Managed Care) | Primary: Physician Assistant

## 2024-06-16 NOTE — Progress Notes (Signed)
 "PHYSICAL / OCCUPATIONAL THERAPY - DAILY TREATMENT NOTE    Patient Name: [name] Short    Date: 06/16/2024    DOB: March 06, 1956  Ins[place]: [place] / Plan: [place] [employer] [employer] [employer] [place] / Product Type: *No Product type* /      Patient DOB verified Yes     Visit #   Current / Total 3 10   Time   In / Out 1217 1258   Pain   In / Out 8/10 7/10   Subjective Functional Status/Changes: Pt reports no new changes.      TREATMENT AREA =  Decreased strength  Loss of balance    OBJECTIVE    Therapeutic Procedures:    Tx Min Billable or 1:1 Min (if diff from Tx Min) Procedure, Rationale, Specifics   15  97110 Therapeutic Exercise (timed):  increase ROM, strength, coordination, balance, and proprioception to improve patient's ability to progress to PLOF and address remaining functional goals. (see flow sheet as applicable)     Details if applicable:       14  97112 Neuromuscular Re-Education (timed):  improve balance, coordination, kinesthetic sense, posture, core stability and proprioception to improve patient's ability to develop conscious control of individual muscles and awareness of position of extremities in order to progress to PLOF and address remaining functional goals. (see flow sheet as applicable)     Details if applicable:  balance, quad/HS re-ed    12  97530 Therapeutic Activity (timed):  use of dynamic activities replicating functional movements to increase ROM, strength, coordination, balance, and proprioception in order to improve patient's ability to progress to PLOF and address remaining functional goals.  (see flow sheet as applicable)     Details if applicable:  standing functional BLE strength   41  MC BC Totals Reminder: bill using total billable min of TIMED therapeutic procedures (example: do not include dry needle or estim unattended, both untimed codes, in totals to left)  8-22 min = 1 unit; 23-37 min = 2 units; 38-52 min = 3 units; 53-67 min = 4 units; 68-82 min = 5 units   Total  Total     Charge Capture    [x]   Patient Education billed concurrently with other procedures   [x]  Review HEP    []  Progressed/Changed HEP, detail:    []  Other detail:       Objective Information/Functional Measures/Assessment    Patient participated in today's session without adverse effect. Pt with continued severe forward posturing and slow gait with 2WW. Added UE reaching up/cross body reaches/scap squeeze with standing balance for further postural control and dynamic balance -- CGA required with gait belt. Cuing  Resumed seated HS curl with appropriate challenge with RTB. Deferred step ups today per pt request, but may resume NV. Independent with STS transfers, except for STS at end of session after seated exercises, requiring min Ax1 for lift assist. Progress as able.       Patient will continue to benefit from skilled PT / OT services to modify and progress therapeutic interventions, analyze and address functional mobility deficits, analyze and address ROM deficits, analyze and address strength deficits, analyze and address soft tissue restrictions, analyze and cue for proper movement patterns, analyze and modify for postural abnormalities, analyze and address imbalance/dizziness, and instruct in home and community integration to address functional deficits and attain remaining goals.    Progress toward goals / Updated goals:  []   See Progress Note/Recertification  Pt will be able to improve strength in Hip flexion, seated hip ER/Adduction; knee extension/flexion to at least 5/5 to improve patient's ability to get in/out of chair with efficiency at home & community. (Initial LTG)                RC: NOT MET; Hip Flexion: right 4/5, left 4+/5, Hip ER: right 4/5, left 4/5, Hip Adduction (seated): right 4+/5, left 5/5, Knee Extension: right 5/5, left 5/5, Knee Flexion: right 4-/5, left 4+/5     2.  Pt will improve LEFS score to 31 to demonstrate improvement in patient's ability to perform unrestricted  ADLs/IADLs at home & community. (Initial LTG)               RC: NOT MET; 12/80 points              Current: reassess at next 30 days [Date assessed: 06/07/24]     3. Pt will report compliance with home HEP at end of care to enhance carry over of fuctional gains made with skilled therapy services in order to improve quality of life. (Initial LTG)                RC: PROGRESSING; reports daily compliance, update as appropriate     Continues to report HEP compliance [Date assessed: 06/16/2024]     4. Pt will improve TUG score to at least 26 seconds with FWW to demonstrate a decrease in patients falls risk. (Initial LTG)               RC: NOT MET; regression to 48 seconds with FWW with left knee pain.     5. Pt will improve her 5 time sit to stand time to least 23 seconds with UE to demonstrate improvement in her functional LE strength & decrease her risk for falls at home.   RC: PROGRESSING; 39 seconds with bilateral UE support    Next PN 07/01/24  RC or Medicaid tracking due 07/01/24  Auth due (visit number/ date) 8 more until 08/03/24     PLAN  - Continue Plan of Care    Erla Diss, PT    06/16/2024    12:21 PM  If an interpreting service was utilized for treatment of this patient, the contents of this document represent the material reviewed with the patient via the interpreter.     Future Appointments   Date Time Provider Department Center   06/21/2024 12:10 PM Reather Chew, Walker [facility]   06/23/2024 12:10 PM Arlis Ozell HACKNEY [facility]   06/28/2024 12:10 PM Rex, Megan, PTA [facility]         "

## 2024-06-21 ENCOUNTER — Inpatient Hospital Stay: Admit: 2024-06-21 | Payer: Medicare (Managed Care) | Primary: Physician Assistant

## 2024-06-21 NOTE — Progress Notes (Signed)
 "PHYSICAL / OCCUPATIONAL THERAPY - DAILY TREATMENT NOTE    Patient Name: [name] Short    Date: 06/21/2024    DOB: 12/05/1955  Insurance: [place] / Plan: [place] [employer] [employer] [employer] [place] / Product Type: *No Product type* /      Patient DOB verified Yes     Visit #   Current / Total 4 10   Time   In / Out 1210 1250   Pain   In / Out 8 7   Subjective Functional Status/Changes: States that her knees feel very stiff today     TREATMENT AREA =  Decreased strength  Loss of balance    OBJECTIVE         Therapeutic Procedures:    Tx Min Billable or 1:1 Min (if diff from Tx Min) Procedure, Rationale, Specifics   32 32 97110 Therapeutic Exercise (timed):  increase ROM, strength, coordination, balance, and proprioception to improve patient's ability to progress to PLOF and address remaining functional goals. (see flow sheet as applicable)     Details if applicable:       8 8 97112 Neuromuscular Re-Education (timed):  improve balance, coordination, kinesthetic sense, posture, core stability and proprioception to improve patient's ability to develop conscious control of individual muscles and awareness of position of extremities in order to progress to PLOF and address remaining functional goals. (see flow sheet as applicable)     Details if applicable:  balance, gluteal re-ed in sitting   40 40 MC BC Totals Reminder: bill using total billable min of TIMED therapeutic procedures (example: do not include dry needle or estim unattended, both untimed codes, in totals to left)  8-22 min = 1 unit; 23-37 min = 2 units; 38-52 min = 3 units; 53-67 min = 4 units; 68-82 min = 5 units   Total Total     Charge Capture    [x]   Patient Education billed concurrently with other procedures   [x]  Review HEP    []  Progressed/Changed HEP, detail:    []  Other detail:       Objective Information/Functional Measures/Assessment    Pt tolerating session well despite reported elevated pain levels at knees today. She was able to  tolerate 1# weight with hip 4 way but noting challenge with HS curl therefore also performing HS curls with TB at right LE with improved return. Tandem balance performed with patient requiring Min A to maintain upright. Demonstrates improved form and tolerance to med ball press up & out. She required extensive recovery period after hip 4 way during session today. Ending session with ball/band exercises to improve proximal hip strength, demonstrates fatigue towards end of session resulting in extended period to stand up from chair. Reports slight decrease in pain after her session.     Patient will continue to benefit from skilled PT / OT services to modify and progress therapeutic interventions, analyze and address functional mobility deficits, analyze and address ROM deficits, analyze and address strength deficits, analyze and address soft tissue restrictions, analyze and cue for proper movement patterns, analyze and modify for postural abnormalities, analyze and address imbalance/dizziness, and instruct in home and community integration to address functional deficits and attain remaining goals.    Progress toward goals / Updated goals:  []   See Progress Note/Recertification     Pt will be able to improve strength in Hip flexion, seated hip ER/Adduction; knee extension/flexion to at least 5/5 to improve patient's ability to get in/out of chair  with efficiency at home & community. (Initial LTG)                RC: NOT MET; Hip Flexion: right 4/5, left 4+/5, Hip ER: right 4/5, left 4/5, Hip Adduction (seated): right 4+/5, left 5/5, Knee Extension: right 5/5, left 5/5, Knee Flexion: right 4-/5, left 4+/5    Current: able to progress with 1# during hip 4 way. [Date assessed: 06/21/24]    2.  Pt will improve LEFS score to 31 to demonstrate improvement in patient's ability to perform unrestricted ADLs/IADLs at home & community. (Initial LTG)               RC: NOT MET; 12/80 points              Current: reassess at next 30  days [Date assessed: 06/07/24]     3. Pt will report compliance with home HEP at end of care to enhance carry over of fuctional gains made with skilled therapy services in order to improve quality of life. (Initial LTG)                RC: PROGRESSING; reports daily compliance, update as appropriate                Continues to report HEP compliance [Date assessed: 06/16/2024]      4. Pt will improve TUG score to at least 26 seconds with FWW to demonstrate a decrease in patients falls risk. (Initial LTG)               RC: NOT MET; regression to 48 seconds with FWW with left knee pain.     5. Pt will improve her 5 time sit to stand time to least 23 seconds with UE to demonstrate improvement in her functional LE strength & decrease her risk for falls at home.   RC: PROGRESSING; 39 seconds with bilateral UE support    Next PN 07/01/24  RC or Medicaid tracking due 07/01/24  Auth due (visit number/ date) 7 more til 08/03/24    PLAN  - Continue Plan of Care    [place], [place]    06/21/2024    12:52 PM  If an interpreting service was utilized for treatment of this patient, the contents of this document represent the material reviewed with the patient via the interpreter.     Future Appointments   Date Time Provider Department Center   06/23/2024 12:10 PM Arlis Ozell [facility]   06/28/2024 12:10 PM Rex, Megan, PTA [facility]         "

## 2024-06-23 ENCOUNTER — Inpatient Hospital Stay: Admit: 2024-06-23 | Payer: Medicare (Managed Care) | Primary: Physician Assistant

## 2024-06-23 NOTE — Progress Notes (Signed)
 "PHYSICAL / OCCUPATIONAL THERAPY - DAILY TREATMENT NOTE    Patient Name: [name] Short    Date: 06/23/2024    DOB: [birth year]  Insurance: [place] / Plan: [employer] [employer] [place] [employer] [employer] [place] / Product Type: *No Product type* /      Patient DOB verified Yes     Visit #   Current / Total 5 10   Time   In / Out 1210 1250   Pain   In / Out 8 5   Subjective Functional Status/Changes: Pt reports feeling less stiff today.     TREATMENT AREA =  Decreased strength  Loss of balance    OBJECTIVE         Therapeutic Procedures:    Tx Min Billable or 1:1 Min (if diff from Tx Min) Procedure, Rationale, Specifics   15  97110 Therapeutic Exercise (timed):  increase ROM, strength, coordination, balance, and proprioception to improve patient's ability to progress to PLOF and address remaining functional goals. (see flow sheet as applicable)     Details if applicable:       15  97112 Neuromuscular Re-Education (timed):  improve balance, coordination, kinesthetic sense, posture, core stability and proprioception to improve patient's ability to develop conscious control of individual muscles and awareness of position of extremities in order to progress to PLOF and address remaining functional goals. (see flow sheet as applicable)     Details if applicable:     10  97530 Therapeutic Activity (timed):  use of dynamic activities replicating functional movements to increase ROM, strength, coordination, balance, and proprioception in order to improve patient's ability to progress to PLOF and address remaining functional goals.  (see flow sheet as applicable)     Details if applicable:            Details if applicable:            Details if applicable:     40  MC BC Totals Reminder: bill using total billable min of TIMED therapeutic procedures (example: do not include dry needle or estim unattended, both untimed codes, in totals to left)  8-22 min = 1 unit; 23-37 min = 2 units; 38-52 min = 3 units; 53-67 min = 4 units; 68-82  min = 5 units   Total Total     Charge Capture    [x]   Patient Education billed concurrently with other procedures   [x]  Review HEP    []  Progressed/Changed HEP, detail:    []  Other detail:       Objective Information/Functional Measures/Assessment    Pt reports no adverse effects from treatment.Pt was able to increase reps with press ups seated with 4.4# ball and good strength and no increase in pain.Pt is well challenged with NBOS with foam at this time.Pt was able to increase wt with LAQ and good return.Ended treatment session with bike and pt reported decrease pain post treatment session.Progress as tolerated.    Patient will continue to benefit from skilled PT / OT services to modify and progress therapeutic interventions, analyze and address functional mobility deficits, analyze and address ROM deficits, analyze and address strength deficits, analyze and address soft tissue restrictions, analyze and cue for proper movement patterns, analyze and modify for postural abnormalities, analyze and address imbalance/dizziness, and instruct in home and community integration to address functional deficits and attain remaining goals.    Progress toward goals / Updated goals:  []   See Progress Note/Recertification     Pt will  be able to improve strength in Hip flexion, seated hip ER/Adduction; knee extension/flexion to at least 5/5 to improve patient's ability to get in/out of chair with efficiency at home & community. (Initial LTG)                RC: NOT MET; Hip Flexion: right 4/5, left 4+/5, Hip ER: right 4/5, left 4/5, Hip Adduction (seated): right 4+/5, left 5/5, Knee Extension: right 5/5, left 5/5, Knee Flexion: right 4-/5, left 4+/5               Current: able to progress with 1# during hip 4 way. [Date assessed: 06/21/24]     2.  Pt will improve LEFS score to 31 to demonstrate improvement in patient's ability to perform unrestricted ADLs/IADLs at home & community. (Initial LTG)               RC: NOT MET; 12/80  points              Current: reassess at next 30 days [Date assessed: 06/07/24]     3. Pt will report compliance with home HEP at end of care to enhance carry over of fuctional gains made with skilled therapy services in order to improve quality of life. (Initial LTG)                RC: PROGRESSING; reports daily compliance, update as appropriate                Continues to report HEP compliance [Date assessed: 06/16/2024]      4. Pt will improve TUG score to at least 26 seconds with FWW to demonstrate a decrease in patients falls risk. (Initial LTG)               RC: NOT MET; regression to 48 seconds with FWW with left knee pain.     5. Pt will improve her 5 time sit to stand time to least 23 seconds with UE to demonstrate improvement in her functional LE strength & decrease her risk for falls at home.   RC: PROGRESSING; 39 seconds with bilateral UE support    Next PN 07/01/24  RC or Medicaid tracking due 07/01/24  Auth due (visit number/ date) 6 more till 08/03/24    PLAN  - Continue Plan of Care    Ozell Low, PTA    06/23/2024    12:12 PM  If an interpreting service was utilized for treatment of this patient, the contents of this document represent the material reviewed with the patient via the interpreter.     Future Appointments   Date Time Provider Department Center   06/28/2024 12:10 PM Rex, Duwaine HACKNEY [facility]         "

## 2024-06-28 ENCOUNTER — Inpatient Hospital Stay: Admit: 2024-06-28 | Payer: Medicare (Managed Care) | Primary: Physician Assistant

## 2024-06-28 NOTE — Therapy Recertification (Signed)
 "[facility] - [address]  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    Visits from [facility]: 19 Missed Visits: 0     Progress to Goals:     Pt will be able to improve strength in Hip flexion, seated hip ER/Adduction; knee extension/flexion to at least 5/5 to improve patient's ability to get in/out of chair with efficiency at home & community. (Initial LTG)                RC: NOT MET; Hip Flexion: right 4/5, left 4+/5, Hip ER: right 4/5, left 4/5, Hip Adduction (seated): right 4+/5, left 5/5, Knee Extension: right 5/5, left 5/5, Knee Flexion: right 4-/5, left 4+/5              RC Status: Hip Flexion: 4/5 B  (HIP: REGRESSION; Knee: PROGRESSION)                          Hip ER:  4-/5 B                          Hip Adduction: 4+/5 B                          Knee Flexion: 4+/5 L; 4/5 R                          Knee Extension: 5/5 B     2.  Pt will improve LEFS score to 31 to demonstrate improvement in patient's ability to perform unrestricted ADLs/IADLs at home & community. (Initial LTG)               RC: NOT MET; 12/80 points              RC Status: 5 points (REGRESSION)     3. Pt will report compliance with home HEP at end of care to enhance carry over of fuctional gains made with skilled therapy services in order to improve quality of life. (Initial LTG)                RC: PROGRESSING;  reports daily compliance, update as appropriate               RC Status: Reports Daily Compliance - Update as needed (MET)     4. Pt will improve TUG score to at least 26 seconds with FWW to demonstrate a decrease in patients falls risk. (Initial LTG)               RC: NOT MET; regression to 48 seconds with FWW with left knee pain.  RC Status: 20 seconds - p! (REGRESSION)     5. Pt will improve her 5 time sit to stand time to least 23 seconds with UE to demonstrate improvement in her functional LE strength & decrease her risk for falls at home.   RC: PROGRESSING; 39 seconds with bilateral UE support  RC Status: 45 seconds with L knee pain (REGRESSION)    Key Functional Changes/Progress:     Functional Gains: holding head up, arm strength  Functional Deficits: standing tolerance, mm spasm, walking, bed mobility, sitting up in bed, reaching to top shelf of fridge, cooking, cleaning, getting in and out of shower  % improvement: 0%  Pain   Average: 5/10                  Best: 5/10                Worst: 9/10  Patient Goal: I want zero pain, mobility, move like I use to     Problem List: pain affecting function, decrease ROM, decrease strength, edema affection function, impaired gait/balance, decrease ADL/functional abilities, decrease activity tolerance, decrease flexibility/joint mobility, and decrease transfer abilities    Treatment Plan may include any combination of the following: 02889 Therapeutic Exercise, 97112 Neuromuscular Re-Education, 97140 Manual Therapy, 97530 Therapeutic Activity, 97535 Self Care/Home Management, 97014 Electrical Stim unattended / [phone] [facility]), and [id number] Gait Training    Primary mitigating factor which impeded progress:  None of these apply from preset list (refer to note for other details)    Patient Goal(s) has been updated and includes: I want zero pain, mobility, move like I use to     Goals for this certification period include and are to be achieved in   8   weeks:    Pt will be able to improve strength in Hip flexion, seated hip ER/Adduction; knee extension/flexion to at least 5/5 to improve patient's ability to get in/out of chair with efficiency at home & community. (Initial LTG)               RC Status: Hip Flexion: 4/5 B  (HIP: REGRESSION; Knee: PROGRESSION)                          Hip ER:  4-/5 B                          Hip Adduction: 4+/5 B                          Knee Flexion: 4+/5 L; 4/5 R                          Knee Extension: 5/5 B     2.  Pt will improve LEFS score to 31 to demonstrate improvement in patient's ability to perform unrestricted ADLs/IADLs at home & community. (Initial LTG)              RC Status: 5 points (REGRESSION)     3. Pt will report compliance with home HEP at end of care to enhance carry over of fuctional gains made with skilled therapy services in order to improve quality of life. (Initial LTG)               RC Status: Reports Daily Compliance - Update as needed (MET)     4.  Pt will improve TUG score to at least 26 seconds with FWW to demonstrate a decrease in patients falls risk. (Initial LTG)              RC Status: 20 seconds - p! (REGRESSION)     5. Pt will improve her 5 time sit to stand time to least 23 seconds with UE to demonstrate improvement in her functional LE strength & decrease her risk for falls at home.   RC Status: 45 seconds with L knee pain (REGRESSION)    Frequency / Duration:   Patient to be seen   2   times per week for   8    weeks:  Frequency / Duration: Patient would benefit from skilled PT 2 times per week for 24 VISITS (2 times per week for 12 weeks/ 90 day cert) sessions as needed in this certification period.  Goals will be assigned and reassessed every 10 visits/ 30 days per Medicare guidelines    Assessments/Recommendations:     Pt attended 19 visits consistently and made minimal progress with skilled physical therapy services.  At time of last visit, Pt reported the following:  Functional Gains  - holding head up, arm strength; Functional Deficits - standing tolerance, mm spasm, walking, bed mobility, sitting up in bed, reaching to top shelf of fridge, cooking, cleaning, getting in and out of shower; and 0% improvement since start of care. Pt has regressed with most goals and is compliant with comprehensive HEP. Patient reports regression may be caused by onset on knee pain. Pt would benefit from continued skilled therapy to increase patient strength and stability to increase safety with ambulation in clinic and community.     If you have any questions/comments please contact us  directly at [phone].   Thank you for allowing us  to assist in the care of your patient.    Certification Period: 06/28/24 - 08/27/24  Reporting Period (date from last assessment to current assessment): 06/02/24 - 06/28/24  Next PN/RC: 07/27/24; RC = 08/27/24    Fonda Shay, PT       06/28/2024       2:35 PM      ___ I have read the above report and request that my patient continue as recommended.   ___ I have read the above report and request that my patient continue therapy with the following changes/special instructions: ________________________________________________   ___ I have read the above report and request that my patient be discharged from therapy.     Physician's Signature:_________________________   DATE:_________   TIME:________                           Ike Olam BIRCH, PA    ** Signature, Date and Time must be completed for valid certification **  Please sign and return to [facility] or you may fax the signed copy to [phone]  Thank you.      "

## 2024-06-28 NOTE — Progress Notes (Addendum)
 "PHYSICAL / OCCUPATIONAL THERAPY - DAILY TREATMENT NOTE    Patient Name: [name] Short    Date: 06/28/2024    DOB: Jan 13, 1956  Ins[place]: [place]: [place] / Plan: [place] [employer] [employer] [employer] [place] / Product Type: *No Product type* /      Patient DOB verified Yes     Visit #   Current / Total 6 10   Time   In / Out 1220 1250   Pain   In / Out 8 8   Subjective Functional Status/Changes: Patient reports 8/10 pain. Feels like she is going backwards with therapy.      TREATMENT AREA =  Decreased strength  Loss of balance    OBJECTIVE      Therapeutic Procedures:    Tx Min Billable or 1:1 Min (if diff from Tx Min) Procedure, Rationale, Specifics   22 22 97530 Therapeutic Activity (timed):  use of dynamic activities replicating functional movements to increase ROM, strength, coordination, balance, and proprioception in order to improve patient's ability to progress to PLOF and address remaining functional goals.  (see flow sheet as applicable)     Details if applicable:       8 8 97535 Self Care/Home Management (timed):  improve patient knowledge and understanding of home injury/symptom/pain management, positioning, posture/ergonomics, home safety, activity modification, transfer techniques, and joint protection strategies  to improve patient's ability to progress to PLOF and address remaining functional goals.  (see flow sheet as applicable)     Details if applicable:     30 30 MC BC Totals Reminder: bill using total billable min of TIMED therapeutic procedures (example: do not include dry needle or estim unattended, both untimed codes, in totals to left)  8-22 min = 1 unit; 23-37 min = 2 units; 38-52 min = 3 units; 53-67 min = 4 units; 68-82 min = 5 units   Total Total     Charge Capture    [x]   Patient Education billed concurrently with other procedures   [x]  Review HEP    []  Progressed/Changed HEP, detail:    []  Other detail:       Objective Information/Functional Measures/Assessment    Functional Gains:  holding head up, arm strength  Functional Deficits: standing tolerance, mm spasm, walking, bed mobility, sitting up in bed, reaching to top shelf of fridge, cooking, cleaning, getting in and out of shower  % improvement: 0%  Pain   Average: 5/10       Best: 5/10     Worst: 9/10  Patient Goal: I want zero pain, mobility, move like I use to     Pt attended 19 visits consistently and made minimal progress with skilled physical therapy services.  At time of last visit, Pt reported the following:  Functional Gains - holding head up, arm strength; Functional Deficits - standing tolerance, mm spasm, walking, bed mobility, sitting up in bed, reaching to top shelf of fridge, cooking, cleaning, getting in and out of shower; and 0% improvement since start of care. Pt has regressed with most goals and is compliant with comprehensive HEP. Patient reports regression may be caused by onset on knee pain. Pt would benefit from continued skilled therapy to increase patient strength and stability to increase safety with ambulation in clinic and community.    Patient will continue to benefit from skilled PT / OT services to modify and progress therapeutic interventions, analyze and address functional mobility deficits, analyze and address ROM deficits, analyze and address strength  deficits, analyze and address soft tissue restrictions, analyze and cue for proper movement patterns, analyze and modify for postural abnormalities, analyze and address imbalance/dizziness, and instruct in home and community integration to address functional deficits and attain remaining goals.    Progress toward goals / Updated goals:  []   See Progress Note/Recertification     Pt will be able to improve strength in Hip flexion, seated hip ER/Adduction; knee extension/flexion to at least 5/5 to improve patient's ability to get in/out of chair with efficiency at home & community. (Initial LTG)                RC: NOT MET; Hip Flexion: right 4/5, left 4+/5,  Hip ER: right 4/5, left 4/5, Hip Adduction (seated): right 4+/5, left 5/5, Knee Extension: right 5/5, left 5/5, Knee Flexion: right 4-/5, left 4+/5               Current: able to progress with 1# during hip 4 way. [Date assessed: 06/21/24]   RC Status: Hip Flexion: 4/5 B  (HIP: REGRESSION; Knee: PROGRESSION)    Hip ER:  4-/5 B    Hip Adduction: 4+/5 B    Knee Flexion: 4+/5 L; 4/5 R    Knee Extension: 5/5 B     2.  Pt will improve LEFS score to 31 to demonstrate improvement in patient's ability to perform unrestricted ADLs/IADLs at home & community. (Initial LTG)               RC: NOT MET; 12/80 points              Current: reassess at next 30 days [Date assessed: 06/07/24]   RC Status: 5 points (REGRESSION)     3. Pt will report compliance with home HEP at end of care to enhance carry over of fuctional gains made with skilled therapy services in order to improve quality of life. (Initial LTG)                RC: PROGRESSING; reports daily compliance, update as appropriate                Continues to report HEP compliance [Date assessed: 06/16/2024]    RC Status: Reports Daily Compliance - Update as needed (MET)     4. Pt will improve TUG score to at least 26 seconds with FWW to demonstrate a decrease in patients falls risk. (Initial LTG)               RC: NOT MET; regression to 48 seconds with FWW with left knee pain.   RC Status: 20 seconds - p! (REGRESSION)     5. Pt will improve her 5 time sit to stand time to least 23 seconds with UE to demonstrate improvement in her functional LE strength & decrease her risk for falls at home.   RC: PROGRESSING; 39 seconds with bilateral UE support  RC Status: 45 seconds with L knee pain (REGRESSION)    Next PN 07/01/2024  RC or Medicaid tracking due 07/01/2024  Auth due (visit number/ date) 5 more til 08/03/2024    PLAN  - Continue Plan of Care    [place] Donnavan Covault, PTA    06/28/2024    11:15 AM  If an interpreting service was utilized for treatment of this patient, the contents of  this document represent the material reviewed with the patient via the interpreter.     Future Appointments   Date Time Provider Department Center  06/28/2024 12:10 PM Tresea Bouchard, PTA [facility]   07/05/2024 12:10 PM Arlis Ozell HACKNEY [facility]   07/07/2024 12:10 PM Arlis Ozell HACKNEY [facility]   07/12/2024 12:10 PM Arlis Ozell HACKNEY [facility]   07/14/2024 12:10 PM Arlis Ozell HACKNEY [facility]   07/19/2024 12:10 PM Arlis Ozell HACKNEY [facility]   07/21/2024 12:10 PM Reather Chew, [place] [facility]   07/25/2024 12:10 PM Arlis Ozell HACKNEY [facility]   07/27/2024 12:10 PM Arlis Ozell, PTA [facility]         "

## 2024-07-05 ENCOUNTER — Inpatient Hospital Stay: Admit: 2024-07-05 | Payer: Medicare (Managed Care) | Primary: Physician Assistant

## 2024-07-05 DIAGNOSIS — R531 Weakness: Principal | ICD-10-CM

## 2024-07-05 NOTE — Progress Notes (Addendum)
 "PHYSICAL / OCCUPATIONAL THERAPY - DAILY TREATMENT NOTE    Patient Name: [name] Short    Date: 07/05/2024    DOB: [birth year]  Insurance: [place]: [place] / Plan: [employer] [employer] [place] [employer] / Product Type: *No Product type* /      Patient DOB verified Yes     Visit #   Current / Total 1 10   Time   In / Out 1230 102   Pain   In / Out 8 8   Subjective Functional Status/Changes: Pt reports some increase in pain today due to the damp weather.     TREATMENT AREA =  Decreased strength  Loss of balance    OBJECTIVE         Therapeutic Procedures:    Tx Min Billable or 1:1 Min (if diff from Tx Min) Procedure, Rationale, Specifics   23  97110 Therapeutic Exercise (timed):  increase ROM, strength, coordination, balance, and proprioception to improve patient's ability to progress to PLOF and address remaining functional goals. (see flow sheet as applicable)     Details if applicable:       10  97530 Therapeutic Activity (timed):  use of dynamic activities replicating functional movements to increase ROM, strength, coordination, balance, and proprioception in order to improve patient's ability to progress to PLOF and address remaining functional goals.  (see flow sheet as applicable)     Details if applicable:            Details if applicable:            Details if applicable:            Details if applicable:     33  MC BC Totals Reminder: bill using total billable min of TIMED therapeutic procedures (example: do not include dry needle or estim unattended, both untimed codes, in totals to left)  8-22 min = 1 unit; 23-37 min = 2 units; 38-52 min = 3 units; 53-67 min = 4 units; 68-82 min = 5 units   Total Total     Charge Capture    [x]   Patient Education billed concurrently with other procedures   [x]  Review HEP    []  Progressed/Changed HEP, detail:    []  Other detail:       Objective Information/Functional Measures/Assessment    Pt arrived to Pt 15 min late and program was adjusted.Pt reports no adverse  effects from treatment session.Pt continues to be well challenged with NBOS EC in parallel bars at this time.Pt able to increase reps with LAQ with good return.Pt exhibits fatigue towards end of session which resulted in extended rest breaks performing exercises in sitting today.Progress as able.    Patient will continue to benefit from skilled PT / OT services to modify and progress therapeutic interventions, analyze and address functional mobility deficits, analyze and address ROM deficits, analyze and address strength deficits, analyze and address soft tissue restrictions, analyze and cue for proper movement patterns, analyze and modify for postural abnormalities, analyze and address imbalance/dizziness, and instruct in home and community integration to address functional deficits and attain remaining goals.    Progress toward goals / Updated goals:  []   See Progress Note/Recertification    Pt will be able to improve strength in Hip flexion, seated hip ER/Adduction; knee extension/flexion to at least 5/5 to improve patient's ability to get in/out of chair with efficiency at home & community. (Initial LTG)  RC Status: Hip Flexion: 4/5 B  (HIP: REGRESSION; Knee: PROGRESSION)                          Hip ER:  4-/5 B                          Hip Adduction: 4+/5 B                          Knee Flexion: 4+/5 L; 4/5 R                          Knee Extension: 5/5 B     2.  Pt will improve LEFS score to 31 to demonstrate improvement in patient's ability to perform unrestricted ADLs/IADLs at home & community. (Initial LTG)              RC Status: 5 points (REGRESSION)     3. Pt will report compliance with home HEP at end of care to enhance carry over of fuctional gains made with skilled therapy services in order to improve quality of life. (Initial LTG)               RC Status: Reports Daily Compliance - Update as needed (MET)     4. Pt will improve TUG score to at least 26 seconds with FWW to demonstrate a  decrease in patients falls risk. (Initial LTG)              RC Status: 20 seconds - p! (REGRESSION)     5. Pt will improve her 5 time sit to stand time to least 23 seconds with UE to demonstrate improvement in her functional LE strength & decrease her risk for falls at home.   RC Status: 45 seconds with L knee pain (REGRESSION)    Next PN 07/27/25  RC or Medicaid tracking due 08/30/23  Auth due (visit number/ date) 4 more till 08/03/24    PLAN  - Continue Plan of Care    Ozell Low, PTA    07/05/2024    12:14 PM  If an interpreting service was utilized for treatment of this patient, the contents of this document represent the material reviewed with the patient via the interpreter.     Future Appointments   Date Time Provider Department Center   07/07/2024 12:10 PM Low Ozell HACKNEY [facility]   07/12/2024 12:10 PM Low Ozell HACKNEY [facility]   07/14/2024 12:10 PM Tresea Duwaine HACKNEY [facility]   07/19/2024 12:10 PM Low Ozell HACKNEY [facility]   07/21/2024 12:10 PM Reather Chew, [place] [facility]   07/25/2024 12:10 PM Low Ozell HACKNEY [facility]   07/27/2024 12:10 PM Low Ozell, PTA [facility]         "

## 2024-07-06 ENCOUNTER — Encounter: Payer: Self-pay | Admitting: Podiatry

## 2024-07-06 ENCOUNTER — Ambulatory Visit: Admitting: Podiatry

## 2024-07-06 DIAGNOSIS — M79675 Pain in left toe(s): Secondary | ICD-10-CM

## 2024-07-06 DIAGNOSIS — B351 Tinea unguium: Secondary | ICD-10-CM | POA: Diagnosis not present

## 2024-07-06 DIAGNOSIS — M79674 Pain in right toe(s): Secondary | ICD-10-CM | POA: Diagnosis not present

## 2024-07-06 DIAGNOSIS — E1142 Type 2 diabetes mellitus with diabetic polyneuropathy: Secondary | ICD-10-CM

## 2024-07-07 ENCOUNTER — Inpatient Hospital Stay: Admit: 2024-07-07 | Payer: Medicare (Managed Care) | Primary: Physician Assistant

## 2024-07-07 NOTE — Progress Notes (Signed)
 "PHYSICAL / OCCUPATIONAL THERAPY - DAILY TREATMENT NOTE    Patient Name: [name] Short    Date: 07/07/2024    DOB: February 14, 1956  Insurance: Pay[place]: [place] / Plan: [employer] [employer] [place] [employer] D[employer] / Product Type: *No Product type* /      Patient DOB verified Yes     Visit #   Current / Total 2 10   Time   In / Out 1210 1255   Pain   In / Out 8 8   Subjective Functional Status/Changes: Pt reports PT is helping with the pain     TREATMENT AREA =  Decreased strength  Loss of balance    OBJECTIVE         Therapeutic Procedures:    Tx Min Billable or 1:1 Min (if diff from Tx Min) Procedure, Rationale, Specifics   15  97110 Therapeutic Exercise (timed):  increase ROM, strength, coordination, balance, and proprioception to improve patient's ability to progress to PLOF and address remaining functional goals. (see flow sheet as applicable)     Details if applicable:       15  97112 Neuromuscular Re-Education (timed):  improve balance, coordination, kinesthetic sense, posture, core stability and proprioception to improve patient's ability to develop conscious control of individual muscles and awareness of position of extremities in order to progress to PLOF and address remaining functional goals. (see flow sheet as applicable)     Details if applicable:     10  97530 Therapeutic Activity (timed):  use of dynamic activities replicating functional movements to increase ROM, strength, coordination, balance, and proprioception in order to improve patient's ability to progress to PLOF and address remaining functional goals.  (see flow sheet as applicable)     Details if applicable:            Details if applicable:            Details if applicable:     40  MC BC Totals Reminder: bill using total billable min of TIMED therapeutic procedures (example: do not include dry needle or estim unattended, both untimed codes, in totals to left)  8-22 min = 1 unit; 23-37 min = 2 units; 38-52 min = 3 units; 53-67 min = 4 units; 68-82  min = 5 units   Total Total     Charge Capture    [x]   Patient Education billed concurrently with other procedures   [x]  Review HEP    []  Progressed/Changed HEP, detail:    []  Other detail:       Objective Information/Functional Measures/Assessment    Pt reports no adverse effects from treatment session.Pt was able to increase reps with ball press with good strength and no increase in pain today.Pt still well challenged with LAQ and 3#, but able to progress from red TB to green performing HS curls at this time.Pt able to perform step taps with the use of 1 UE without LOB today.Frequent verbal cues needed to encourage upright postural awareness performing same.Progress as able.     Patient will continue to benefit from skilled PT / OT services to modify and progress therapeutic interventions, analyze and address functional mobility deficits, analyze and address ROM deficits, analyze and address strength deficits, analyze and address soft tissue restrictions, analyze and cue for proper movement patterns, analyze and modify for postural abnormalities, analyze and address imbalance/dizziness, and instruct in home and community integration to address functional deficits and attain remaining goals.    Progress toward goals /  Updated goals:  []   See Progress Note/Recertification    Pt will be able to improve strength in Hip flexion, seated hip ER/Adduction; knee extension/flexion to at least 5/5 to improve patient's ability to get in/out of chair with efficiency at home & community. (Initial LTG)               RC Status: Hip Flexion: 4/5 B  (HIP: REGRESSION; Knee: PROGRESSION)                          Hip ER:  4-/5 B                          Hip Adduction: 4+/5 B                          Knee Flexion: 4+/5 L; 4/5 R                          Knee Extension: 5/5 B     2.  Pt will improve LEFS score to 31 to demonstrate improvement in patient's ability to perform unrestricted ADLs/IADLs at home & community. (Initial LTG)               RC Status: 5 points (REGRESSION)     3. Pt will report compliance with home HEP at end of care to enhance carry over of fuctional gains made with skilled therapy services in order to improve quality of life. (Initial LTG)               RC Status: Reports Daily Compliance - Update as needed (MET)     4. Pt will improve TUG score to at least 26 seconds with FWW to demonstrate a decrease in patients falls risk. (Initial LTG)              RC Status: 20 seconds - p! (REGRESSION)     5. Pt will improve her 5 time sit to stand time to least 23 seconds with UE to demonstrate improvement in her functional LE strength & decrease her risk for falls at home.   RC Status: 45 seconds with L knee pain (REGRESSION)    Next PN 07/27/24  RC or Medicaid tracking due 08/29/24  Auth due (visit number/ date) 3 more till 08/03/24    PLAN  - Continue Plan of Care    Ozell Low, PTA    07/07/2024    12:14 PM  If an interpreting service was utilized for treatment of this patient, the contents of this document represent the material reviewed with the patient via the interpreter.     Future Appointments   Date Time Provider Department Center   07/12/2024 12:10 PM Low Ozell HACKNEY [facility]   07/14/2024 12:10 PM Tresea Duwaine HACKNEY [facility]   07/19/2024 12:10 PM Low Ozell HACKNEY [facility]   07/21/2024 12:10 PM Reather Chew, [place] [facility]   07/25/2024 12:10 PM Low Ozell HACKNEY [facility]   07/27/2024 12:10 PM Low Ozell, PTA [facility]         "

## 2024-07-10 ENCOUNTER — Encounter: Payer: Self-pay | Admitting: Podiatry

## 2024-07-10 NOTE — Progress Notes (Signed)
  Subjective:  Patient ID: Julie Elliott, female    DOB: 1955/12/14,  MRN: [id number]  Julie Elliott presents to clinic today for at risk foot care with history of diabetic neuropathy and painful mycotic toenails of both feet that are difficult to trim. Pain interferes with daily activities and wearing enclosed shoe gear comfortably. Her son is present during today's visit. Chief Complaint  Patient presents with   [facility]    Rm15 Diabetic foot care/Dr. Remonia D'Mello last visit    New problem(s): None.   PCP is D'Mello, Rosalyn, DO.   Allergies  Allergen Reactions   Loratadine-Pseudoephedrine Er Hives    Claritin-D    Review of Systems: Negative except as noted in the HPI.  Objective: No changes noted in today's physical examination. There were no vitals filed for this visit. Julie Elliott is a pleasant 68 y.o. female in NAD. AAO x 3.  Vascular Examination: Capillary refill time immediate b/l. Vascular status intact b/l with palpable DP pulses; faintly palpable PT pulses. Pedal hair absent b/l. No pain with calf compression b/l. Skin temperature gradient WNL b/l. No cyanosis or clubbing noted b/l. No ischemia or gangrene b/l. Nonpitting edema noted BLE.  Neurological Examination: Sensation grossly intact b/l with 10 gram monofilament. Vibratory sensation intact b/l. Pt has subjective symptoms of neuropathy.  Dermatological Examination: Mildly dry skin noted b/l. No open wounds. No interdigital macerations.   Toenails 1-5 b/l thick, discolored, elongated with subungual debris and pain on dorsal palpation.   Musculoskeletal Examination: Muscle strength 5/5 to all lower extremity muscle groups bilaterally. Pes planus deformity noted bilateral LE.  Radiographs: None  Assessment/Plan: 1. Pain due to onychomycosis of toenails of both feet   2. Diabetic peripheral neuropathy associated with type 2 diabetes mellitus [facility])   Consent given for treatment. Patient examined. All patient's and/or  POA's questions/concerns addressed on today's visit. Encouraged patient to moisturize feet daily. Toenails 1-5 b/l debrided in length and girth without incident. Continue foot and shoe inspections daily. Monitor blood glucose per PCP/Endocrinologist's recommendations. Continue soft, supportive shoe gear daily. Report any pedal injuries to medical professional. Call office if there are any questions/concerns.  Return in about 3 months (around 10/04/2024).  Delon LITTIE Merlin, DPM      [facility] LOCATION: [address] [place] [address]                   Office [phone]   [facility] LOCATION: [address] [place], [place] [address] Office [phone]

## 2024-07-12 ENCOUNTER — Inpatient Hospital Stay: Payer: Medicare (Managed Care) | Primary: Physician Assistant

## 2024-07-13 ENCOUNTER — Ambulatory Visit

## 2024-07-13 ENCOUNTER — Inpatient Hospital Stay: Admit: 2024-07-13 | Payer: Medicare (Managed Care) | Primary: Physician Assistant

## 2024-07-13 VITALS — Ht 63.0 in | Wt 215.0 lb

## 2024-07-13 DIAGNOSIS — Z0000 Encounter for general adult medical examination without abnormal findings: Secondary | ICD-10-CM

## 2024-07-13 NOTE — Progress Notes (Signed)
 "PHYSICAL / OCCUPATIONAL THERAPY - DAILY TREATMENT NOTE    Patient Name: [name] Short    Date: 07/13/2024    DOB: 1956-06-02  Ins[place]: [place] / Plan: [place] [employer] [employer] DUAL / Product Type: *No Product type* /      Patient DOB verified Yes     Visit #   Current / Total 3 10   Time   In / Out 350 410   Pain   In / Out 8 7   Subjective Functional Status/Changes: Knees are hurting a lot today - requesting to hold on standing exercises today.      TREATMENT AREA =  Decreased strength  Loss of balance    OBJECTIVE         Therapeutic Procedures:    Tx Min Billable or 1:1 Min (if diff from Tx Min) Procedure, Rationale, Specifics   20 20 [id number] Therapeutic Exercise (timed):  increase ROM, strength, coordination, balance, and proprioception to improve patient's ability to progress to PLOF and address remaining functional goals. (see flow sheet as applicable)     Details if applicable:       20 20 [facility] BC Totals Reminder: bill using total billable min of TIMED therapeutic procedures (example: do not include dry needle or estim unattended, both untimed codes, in totals to left)  8-22 min = 1 unit; 23-37 min = 2 units; 38-52 min = 3 units; 53-67 min = 4 units; 68-82 min = 5 units   Total Total     Charge Capture    [x]   Patient Education billed concurrently with other procedures   [x]  Review HEP    []  Progressed/Changed HEP, detail:    []  Other detail:       Objective Information/Functional Measures/Assessment    Very shortened session due to late arrival.    Exercises today consisting of seated exercises via ball/band and LAQ/HS curls. Also incorporating UE strengthening via seated rows/extension with cues for upright posture during task. Able to progress to 3# bicep curls and GTB tricep extension - cues to keep elbow pinned to side to ensure isolation of tricep vs. Shoulder extension. Continue to with standing exercises if tolerable.     Patient will continue to benefit from skilled PT / OT  services to modify and progress therapeutic interventions, analyze and address functional mobility deficits, analyze and address ROM deficits, analyze and address strength deficits, analyze and address soft tissue restrictions, analyze and cue for proper movement patterns, analyze and modify for postural abnormalities, analyze and address imbalance/dizziness, and instruct in home and community integration to address functional deficits and attain remaining goals.    Progress toward goals / Updated goals:  []   See Progress Note/Recertification    Pt will be able to improve strength in Hip flexion, seated hip ER/Adduction; knee extension/flexion to at least 5/5 to improve patient's ability to get in/out of chair with efficiency at home & community. (Initial LTG)               RC Status: Hip Flexion: 4/5 B  (HIP: REGRESSION; Knee: PROGRESSION)                          Hip ER:  4-/5 B                          Hip Adduction: 4+/5 B  Knee Flexion: 4+/5 L; 4/5 R                          Knee Extension: 5/5 B     2.  Pt will improve LEFS score to 31 to demonstrate improvement in patient's ability to perform unrestricted ADLs/IADLs at home & community. (Initial LTG)              RC Status: 5 points (REGRESSION)   Current: reassess at next 30 days [Date assessed: 07/13/24]       3. Pt will report compliance with home HEP at end of care to enhance carry over of fuctional gains made with skilled therapy services in order to improve quality of life. (Initial LTG)               RC Status: Reports Daily Compliance - Update as needed (MET)     4. Pt will improve TUG score to at least 26 seconds with FWW to demonstrate a decrease in patients falls risk. (Initial LTG)              RC Status: 20 seconds - p! (REGRESSION)     5. Pt will improve her 5 time sit to stand time to least 23 seconds with UE to demonstrate improvement in her functional LE strength & decrease her risk for falls at home.   RC Status:  45 seconds with L knee pain (REGRESSION)    Next PN 07/27/24  RC or Medicaid tracking due 08/29/24  Auth due (visit number/ date) 2 more til 08/03/24    PLAN  - Continue Plan of Care    [place], [place]    07/13/2024    5:09 PM  If an interpreting service was utilized for treatment of this patient, the contents of this document represent the material reviewed with the patient via the interpreter.     Future Appointments   Date Time Provider Department Center   07/14/2024 12:10 PM Tresea Duwaine H[facility]   07/19/2024 12:10 PM Violeta Mady JAYSON [facility]   07/21/2024 12:10 PM Reather Chew,  [facility]   07/25/2024 12:10 PM Arlis Ozell [facility]   07/27/2024 12:10 PM Arlis Ozell, PTA [facility]         "

## 2024-07-13 NOTE — Progress Notes (Signed)
 Chief Complaint  Patient presents with   Medicare Wellness    SUBSEQUENT     Subjective:   Julie Elliott is a 68 y.o. female who presents for a Medicare Annual Wellness Visit.  Visit info / Clinical Intake: Medicare Wellness Visit Type:: Subsequent Annual Wellness Visit Persons participating in visit and providing information:: patient Medicare Wellness Visit Mode:: Telephone If telephone:: video declined Since this visit was completed virtually, some vitals may be partially provided or unavailable. Missing vitals are due to the limitations of the virtual format.: Documented vitals are patient reported If Telephone or Video please confirm:: I connected with patient using audio/video enable telemedicine. I verified patient identity with two identifiers, discussed telehealth limitations, and patient agreed to proceed. Patient Location:: HOME Provider Location:: OFFICE Interpreter Needed?: No Pre-visit prep was completed: yes AWV questionnaire completed by patient prior to visit?: no Living arrangements:: with family/others (WITH HER SONS) Patient's Overall Health Status Rating: excellent Typical amount of pain: none Does pain affect daily life?: no Are you currently prescribed opioids?: no  Dietary Habits and Nutritional Risks How many meals a day?: 3 Eats fruit and vegetables daily?: yes Most meals are obtained by: having others provide food ([employer]) In the last 2 weeks, have you had any of the following?: none Diabetic:: no (PREDIABETIC PER PATIENT) Any non-healing wounds?: no Would you like to be referred to a Nutritionist or for Diabetic Management? : no  Functional Status Activities of Daily Living (to include ambulation/medication): Independent Ambulation: Independent with device- listed below Home Assistive Devices/Equipment: Johna Finder (specify Type); Eyeglasses; Elevated toliet seat; Shower/tub chair Medication Administration: Independent Home Management (perform  basic housework or laundry): Independent Manage your own finances?: yes Primary transportation is: family / friends Concerns about vision?: no *vision screening is required for WTM* Concerns about hearing?: no  Fall Screening Falls in the past year?: 0 Number of falls in past year: 0 Was there an injury with Fall?: 0 Fall Risk Category Calculator: 0 Patient Fall Risk Level: Low Fall Risk  Fall Risk Patient at Risk for Falls Due to: No Fall Risks Fall risk Follow up: Falls evaluation completed; Education provided  Home and Transportation Safety: All rugs have non-skid backing?: N/A, no rugs All stairs or steps have railings?: N/A, no stairs Grab bars in the bathtub or shower?: yes Have non-skid surface in bathtub or shower?: yes Good home lighting?: yes Regular seat belt use?: yes Hospital stays in the last year:: no  Cognitive Assessment Difficulty concentrating, remembering, or making decisions? : no Will 6CIT or Mini Cog be Completed: yes What year is it?: 0 points What month is it?: 0 points Give patient an address phrase to remember (5 components): [place] [address] About what time is it?: 0 points Count backwards from 20 to 1: 0 points Say the months of the year in reverse: 0 points Repeat the address phrase from earlier: 0 points 6 CIT Score: 0 points  Advance Directives (For Healthcare) Does Patient Have a Medical Advance Directive?: No Would patient like information on creating a medical advance directive?: No - Patient declined (PENDING COMPLETION)  Reviewed/Updated  Reviewed/Updated: Reviewed All (Medical, Surgical, Family, Medications, Allergies, Care Teams, Patient Goals)    Allergies (verified) Loratadine-pseudoephedrine er   Current Medications (verified) Outpatient Encounter Medications as of 07/13/2024  Medication Sig   ammonium lactate  (AMLACTIN) 12 % lotion Apply 1 application topically 2 (two) times daily. Apply to feet once daily.    aspirin  EC 81 MG  tablet Take 1 tablet (81 mg total) by mouth daily.   atorvastatin  (LIPITOR) 40 MG tablet Take 1 tablet (40 mg total) by mouth daily.   buPROPion  (WELLBUTRIN  SR) 150 MG 12 hr tablet Take 1 tablet (150 mg total) by mouth 2 (two) times daily.   clotrimazole -betamethasone  (LOTRISONE ) cream Apply to both feet twice daily for 6 weeks.   Empagliflozin -metFORMIN  HCl (SYNJARDY ) 5-500 MG TABS Take 1 tablet by mouth 2 (two) times daily.   enalapril  (VASOTEC ) 5 MG tablet Take 1 tablet (5 mg total) by mouth 2 (two) times daily.   furosemide  (LASIX ) 40 MG tablet Take 1 tablet (40 mg total) by mouth 2 (two) times daily.   metoprolol  succinate (TOPROL -XL) 50 MG 24 hr tablet TAKE 1 TABLET DAILY WITH OR IMMEDIATELY FOLLOWING A MEAL.   potassium chloride  SA (KLOR-CON  M) 20 MEQ tablet Take 1 tablet (20 mEq total) by mouth daily.   spironolactone  (ALDACTONE ) 25 MG tablet Take 1 tablet (25 mg total) by mouth daily.   No facility-administered encounter medications on file as of 07/13/2024.    History: Past Medical History:  Diagnosis Date   Cervical disc disease with myelopathy    CHF (congestive heart failure) (HCC)    Chronic headaches    DJD (degenerative joint disease)    DM (diabetes mellitus) (HCC) 08/04/2006   stable HgBA1C at 6.5   GERD (gastroesophageal reflux disease)    well controlled on Omeprazole    HLD (hyperlipidemia) 08/04/2006   stable, well controlled   Hypertension    well controlled   Past Surgical History:  Procedure Laterality Date   carpel tunnel Right    20 yrs ago   CERVICAL SPINE SURGERY  2016   TUBAL LIGATION     Family History  Problem Relation Age of Onset   Diabetes Mother    Heart disease Mother    Prostate cancer Father    Neuropathy Neg Hx    Social History   Occupational History   Occupation: Unemployed  Tobacco Use   Smoking status: Some Days    Current packs/day: 0.00    Types: Cigarettes    Last attempt to quit: 04/18/2013    Years  since quitting: 11.2   Smokeless tobacco: Current   Tobacco comments:    1-2 cigs in the a.m.  Vaping Use   Vaping status: Never Used  Substance and Sexual Activity   Alcohol use: No    Alcohol/week: 0.0 standard drinks of alcohol   Drug use: No   Sexual activity: Not Currently   Tobacco Counseling Ready to quit: Not Answered Counseling given: Not Answered Tobacco comments: 1-2 cigs in the a.m.  SDOH Screenings   Food Insecurity: No Food Insecurity (07/13/2024)  Housing: Low Risk  (07/13/2024)  Transportation Needs: No Transportation Needs (07/13/2024)  Utilities: Not At Risk (07/13/2024)  Alcohol Screen: Low Risk  (07/13/2024)  Depression (PHQ2-9): Low Risk  (07/13/2024)  Financial Resource Strain: Low Risk  (07/13/2024)  Physical Activity: Sufficiently Active (07/13/2024)  Social Connections: Socially Isolated (07/13/2024)  Stress: No Stress Concern Present (07/13/2024)  Tobacco Use: High Risk (07/13/2024)  Health Literacy: Adequate Health Literacy (07/13/2024)   See flowsheets for full screening details  Depression Screen PHQ 2 & 9 Depression Scale- Over the past 2 weeks, how often have you been bothered by any of the following problems? Little interest or pleasure in doing things: 0 Feeling down, depressed, or hopeless (PHQ Adolescent also includes...irritable): 0 PHQ-2 Total Score: 0 Trouble falling or staying asleep,  or sleeping too much: 0 Feeling tired or having little energy: 0 Poor appetite or overeating (PHQ Adolescent also includes...weight loss): 0 Feeling bad about yourself - or that you are a failure or have let yourself or your family down: 0 Trouble concentrating on things, such as reading the newspaper or watching television (PHQ Adolescent also includes...like school work): 0 Moving or speaking so slowly that other people could have noticed. Or the opposite - being so fidgety or restless that you have been moving around a lot more than usual: 0 Thoughts  that you would be better off dead, or of hurting yourself in some way: 0 PHQ-9 Total Score: 0 If you checked off any problems, how difficult have these problems made it for you to do your work, take care of things at home, or get along with other people?: Not difficult at all  Depression Treatment Depression Interventions/Treatment : EYV7-0 Score <4 Follow-up Not Indicated     Goals Addressed             This Visit's Progress    Client understands the importance of follow-up with providers by attending scheduled visits       Quit Smoking               Objective:    Today's Vitals   07/13/24 1535  Weight: 215 lb (97.5 kg)  Height: 5' 3 (1.6 m)  PainSc: 0-No pain   Body mass index is 38.09 kg/m.  Hearing/Vision screen Hearing Screening - Comments:: ADEQUATE HEARING. Vision Screening - Comments:: WEARS EYEGLASSES.  NOT UP TO DATE WITH YEARLY EYE EXAM. DEFER TO PCP. Immunizations and Health Maintenance Health Maintenance  Topic Date Due   Pneumococcal Vaccine: 50+ Years (1 of 2 - PCV) Never done   Zoster Vaccines- Shingrix (1 of 2) Never done   OPHTHALMOLOGY EXAM  06/21/2015   Influenza Vaccine  03/04/2024   HEMOGLOBIN A1C  03/20/2024   COVID-19 Vaccine (1 - 2025-26 season) Never done   Mammogram  09/20/2024 (Originally 11/12/2016)   Bone Density Scan  09/20/2024 (Originally 02/01/2023)   Colonoscopy  09/20/2024 (Originally 07/21/2018)   Diabetic kidney evaluation - eGFR measurement  09/20/2024   Diabetic kidney evaluation - Urine ACR  09/20/2024   Medicare Annual Wellness (AWV)  07/13/2025   Hepatitis C Screening  Completed   Meningococcal B Vaccine  Aged Out   DTaP/Tdap/Td  Discontinued   FOOT EXAM  Discontinued        Assessment/Plan:  This is a routine wellness examination for [place].  Patient Care Team: D'Mello, Rosalyn, DO as PCP - General Lonni Slain, MD as PCP - Cardiology (Cardiology) Dr. Colbert Crumble as Consulting Physician  (Ophthalmology)  I have personally reviewed and noted the following in the patients chart:   Medical and social history Use of alcohol, tobacco or illicit drugs  Current medications and supplements including opioid prescriptions. Functional ability and status Nutritional status Physical activity Advanced directives List of other physicians Hospitalizations, surgeries, and ER visits in previous 12 months Vitals Screenings to include cognitive, depression, and falls Referrals and appointments  No orders of the defined types were placed in this encounter.  In addition, I have reviewed and discussed with patient certain preventive protocols, quality metrics, and best practice recommendations. A written personalized care plan for preventive services as well as general preventive health recommendations were provided to patient.   Roz LOISE Fuller, LPN   [id number]   Return in 1 year (on 07/13/2025).  After Visit  Summary: (Mail) Due to this being a telephonic visit, the after visit summary with patients personalized plan was offered to patient via mail   Nurse Notes: Patient declined all vaccines and eye exam.

## 2024-07-13 NOTE — Patient Instructions (Signed)
 Ms. Bezio,  Thank you for taking the time for your Medicare Wellness Visit. I appreciate your continued commitment to your health goals. Please review the care plan we discussed, and feel free to reach out if I can assist you further.  Please note that Annual Wellness Visits do not include a physical exam. Some assessments may be limited, especially if the visit was conducted virtually. If needed, we may recommend an in-person follow-up with your provider.  Ongoing Care Seeing your primary care provider every 3 to 6 months helps us  monitor your health and provide consistent, personalized care.   Referrals If a referral was made during today's visit and you haven't received any updates within two weeks, please contact the referred provider directly to check on the status.  Recommended Screenings:  Health Maintenance  Topic Date Due   Pneumococcal Vaccine for age over 36 (1 of 2 - PCV) Never done   Zoster (Shingles) Vaccine (1 of 2) Never done   Eye exam for diabetics  06/21/2015   Flu Shot  03/04/2024   Hemoglobin A1C  03/20/2024   COVID-19 Vaccine (1 - 2025-26 season) Never done   Medicare Annual Wellness Visit  04/27/2024   Breast Cancer Screening  09/20/2024*   Osteoporosis screening with Bone Density Scan  09/20/2024*   Colon Cancer Screening  09/20/2024*   Yearly kidney function blood test for diabetes  09/20/2024   Yearly kidney health urinalysis for diabetes  09/20/2024   Hepatitis C Screening  Completed   Meningitis B Vaccine  Aged Out   DTaP/Tdap/Td vaccine  Discontinued   Complete foot exam   Discontinued  *Topic was postponed. The date shown is not the original due date.       07/13/2024    3:36 PM  Advanced Directives  Does Patient Have a Medical Advance Directive? No  Would patient like information on creating a medical advance directive? --    Vision: Annual vision screenings are recommended for early detection of glaucoma, cataracts, and diabetic retinopathy.  These exams can also reveal signs of chronic conditions such as diabetes and high blood pressure.  Dental: Annual dental screenings help detect early signs of oral cancer, gum disease, and other conditions linked to overall health, including heart disease and diabetes.  Please see the attached documents for additional preventive care recommendations.

## 2024-07-14 ENCOUNTER — Inpatient Hospital Stay: Payer: Medicare (Managed Care) | Primary: Physician Assistant

## 2024-07-14 ENCOUNTER — Inpatient Hospital Stay
Admit: 2024-07-14 | Discharge: 2024-07-14 | Disposition: A | Discharge status: Home / Self Care | Payer: Medicare (Managed Care) | Arrived: VH | Attending: Emergency Medicine

## 2024-07-14 ENCOUNTER — Emergency Department: Admit: 2024-07-14 | Payer: Medicare (Managed Care) | Primary: Physician Assistant

## 2024-07-14 DIAGNOSIS — M25562 Pain in left knee: Principal | ICD-10-CM

## 2024-07-14 LAB — CBC WITH AUTO DIFFERENTIAL
Basophils %: 0.2 % (ref 0.0–2.0)
Basophils Absolute: 0.01 K/UL (ref 0.00–0.10)
Eosinophils %: 2.2 % (ref 0.0–5.0)
Eosinophils Absolute: 0.1 K/UL (ref 0.00–0.40)
Hematocrit: 39.5 % (ref 35.0–45.0)
Hemoglobin: 12.6 g/dL (ref 12.0–16.0)
Immature Granulocytes %: 0.2 % (ref 0.0–0.5)
Immature Granulocytes Absolute: 0.01 K/UL (ref 0.00–0.04)
Lymphocytes %: 18.1 % — ABNORMAL LOW (ref 21.0–52.0)
Lymphocytes Absolute: 0.84 K/UL — ABNORMAL LOW (ref 0.90–3.60)
MCH: 25.7 pg (ref 24.0–34.0)
MCHC: 31.9 g/dL (ref 31.0–37.0)
MCV: 80.4 FL (ref 78.0–100.0)
MPV: 10.9 FL (ref 9.2–11.8)
Monocytes %: 14.4 % — ABNORMAL HIGH (ref 3.0–10.0)
Monocytes Absolute: 0.67 K/UL (ref 0.05–1.20)
Neutrophils %: 64.9 % (ref 40.0–73.0)
Neutrophils Absolute: 3.02 K/UL (ref 1.80–8.00)
Nucleated RBCs: 0 /100{WBCs}
Platelets: 190 K/uL (ref 135–420)
RBC: 4.91 M/uL (ref 4.20–5.30)
RDW: 13.1 % (ref 11.6–14.5)
WBC: 4.7 K/uL (ref 4.6–13.2)
nRBC: 0 K/uL (ref 0.00–0.01)

## 2024-07-14 LAB — BASIC METABOLIC PANEL
Anion Gap: 9 mmol/L (ref 3.0–18.0)
BUN/Creatinine Ratio: 24 — ABNORMAL HIGH (ref 12–20)
BUN: 22 mg/dL (ref 6–23)
CO2: 28 mmol/L (ref 21–32)
Calcium: 9.6 mg/dL (ref 8.6–10.2)
Chloride: 104 mmol/L (ref 98–107)
Creatinine: 0.88 mg/dL (ref 0.6–1.3)
Est, Glom Filt Rate: 72 ml/min/1.73m2 (ref >60)
Glucose: 278 mg/dL — ABNORMAL HIGH (ref 74–108)
Potassium: 5.3 mmol/L (ref 3.5–5.5)
Sodium: 141 mmol/L (ref 136–145)

## 2024-07-14 LAB — MAGNESIUM: Magnesium: 1.9 mg/dL (ref 1.6–2.6)

## 2024-07-14 MED ORDER — MELOXICAM 7.5 MG PO TABS
7.5 | ORAL_TABLET | Freq: Two times a day (BID) | ORAL | 0 refills | Status: AC | PRN
Start: 2024-07-14 — End: ?

## 2024-07-14 NOTE — ED Provider Notes (Signed)
 "EMERGENCY DEPARTMENT HISTORY AND PHYSICAL EXAM    10:23 PM      Date: 07/14/2024  Patient Name: [name] Short    History of Presenting Illness     Chief Complaint   Patient presents with    Left Knee Pain    Left Arm Weakness    Right Big Toe Pain       History From: Patient and Patient's Daughter    [name] Short is a 68 y.o. female   With Pmhx of osteoarthritis, T2DM, anemia, diabetes, hyperlipidemia, stroke, CHF presenting due to concern of L knee pain and R great toe pain. Pt states she has a caregiver come to the house who has stepped on her toe several times in the past week, 4 which the daughter has witnessed, 2 times today. Pt states she does not think it is purposeful, states she is either trying to help her up or is moving past her when this happens. States she is having significant pain in her R great toe from this however and is worried that it is fractured. States her L knee pain has been going on for a long time, states she was told she had arthritis. Used to see Dr. Evlyn with orthopedics but has not seen him in a year. States she has tried tylenol  at home for the pain which helped the knee pain a bit but not the toe pain. Is able to still ambulate with a rolling walker. Pt states she feels safe at home. Daughter says she called the agency of the caregiver due to the frequent accidental toe stepping incidents.            Nursing Notes were all reviewed and agreed with or any disagreements were addressed in the HPI.    PCP: Ike Planas D, PA    No current facility-administered medications for this encounter.     Current Outpatient Medications   Medication Sig Dispense Refill    meloxicam  (MOBIC ) 7.5 MG tablet Take 1 tablet by mouth 2 times daily as needed for Pain 10 tablet 0    Probiotic Product (PROBIOTIC BLEND PO) Take by mouth Daily      famotidine  (PEPCID ) 20 MG tablet TAKE 1 TABLET BY MOUTH ONCE A DAY FOR GERD      vitamin C (ASCORBIC ACID) 500 MG tablet Take 1 tablet by mouth daily      insulin   glargine (LANTUS ) 100 UNIT/ML injection vial Inject 10 Units into the skin daily 1 each 0    insulin  lispro (HUMALOG ) 100 UNIT/ML SOLN injection vial Inject 3 Units into the skin 3 times daily (with meals) 1 each 0    hydrALAZINE  (APRESOLINE ) 25 MG tablet Take 1 tablet by mouth every 8 hours 90 tablet 3    potassium chloride  (KLOR-CON  M) 10 MEQ extended release tablet Take 1 tablet by mouth daily      cinacalcet  (SENSIPAR ) 30 MG tablet Take 1 tablet by mouth 2 times daily      carvedilol  (COREG ) 6.25 MG tablet Take 1 tablet by mouth 2 times daily (with meals) 60 tablet 3    baclofen  (LIORESAL ) 10 MG tablet Take 1 tablet by mouth 3 times daily as needed (PAIN) Indications: Pain FOR PAIN PER SHETH      Magnesium  Oxide 400 MG CAPS Take 1 capsule by mouth daily.      aspirin  325 MG tablet Take 1 tablet by mouth daily      atorvastatin  (LIPITOR ) 80 MG tablet Take 1  tablet by mouth nightly 30 tablet 3    ferrous sulfate  (IRON  325) 325 (65 Fe) MG tablet Take 1 tablet by mouth daily 30 tablet     Multiple Vitamins-Minerals (THERAPEUTIC MULTIVITAMIN-MINERALS) tablet Take 1 tablet by mouth daily      furosemide  (LASIX ) 40 MG tablet furosemide  40 mg tablet   TAKE 1 TABLET BY MOUTH EVERY DAY         Past History     Past Medical History:  Past Medical History:   Diagnosis Date    Anemia     Arthritis     Chronic pain     Legs and Shoulders    Diabetes (HCC)     Exposure to asbestos     History of blood transfusion     History of colon polyps     HTN (hypertension)     Hx of blood clots     DVT    Hyperlipemia     Menopause     Pulmonary nodule     Serum calcium  elevated     Sleep apnea     not using cpap    Stroke (HCC) 12/2021    left sided weakness       Past Surgical History:  Past Surgical History:   Procedure Laterality Date    BACK SURGERY N/A 06/24/2022    INCISION AND DRAINAGE /DEBRIDEMENT SACRAL DECUBITUS performed by Reda Garre, DO at [facility] MAIN OR    CESAREAN SECTION  1987    COLONOSCOPY N/A 01/13/2017     COLONOSCOPY performed by Debby Dun, MD at [facility] ENDOSCOPY    COLONOSCOPY      COLONOSCOPY N/A 04/08/2023    COLONOSCOPY DIAGNOSTIC performed by Gerlean Norleen Anes, MD at [facility] ENDOSCOPY    DILATION AND CURETTAGE OF UTERUS  1995    RECTAL SURGERY N/A 12/28/2021    SACRAL WOUND DEBRIDEMENT INCISION AND DRAINAGE performed by Garre Reda, DO at [facility] MAIN OR    SPINE SURGERY N/A 12/09/2021    CERVICAL THREE/FOUR/FIVE/SIX LAMINECTOMY FUSION; C-ARM; STRYKER; EXT BONE GROWTH STIM; 23 HR performed by Anes KATHEE March, MD at [facility] MAIN OR       Family History:  Family History   Problem Relation Age of Onset    Diabetes Other 36        parent, NOS    Cancer Father     Hypertension Other 35        parent,NOS    Heart Disease Other 59        parent, NOS    Lung Disease Father        Social History:  Social History     Tobacco Use    Smoking status: Former     Current packs/day: 1.00     Average packs/day: 1 pack/day for 34.3 years (34.3 ttl pk-yrs)     Types: Cigarettes, Cigars     Start date: 04/12/1990    Smokeless tobacco: Never   Vaping Use    Vaping status: Never Used   Substance Use Topics    Alcohol use: Not Currently     Comment: occ    Drug use: Never       Allergies:  Allergies   Allergen Reactions    Lactose Other (See Comments)     Intolerance         Review of Systems       Review of Systems   Musculoskeletal:         Knee  pain  Toe pain         Physical Exam   BP (!) 131/58   Pulse 71   Temp 97.9 F (36.6 C) (Oral)   Resp 16   Ht 1.499 m (4' 11)   Wt 68.9 kg (152 lb)   SpO2 99%   BMI 30.70 kg/m        Physical Exam  HENT:      Head: Normocephalic.   Cardiovascular:      Rate and Rhythm: Normal rate and regular rhythm.   Pulmonary:      Effort: Pulmonary effort is normal.      Breath sounds: Normal breath sounds.   Musculoskeletal:      Comments: L knee with slight suprapatellar effusion. No tenderness to palpation of knees bilaterally. No erythema present bilaterally. Limited range of motion with flexion, full  ROM with extension. Ecchymosis on lateral side of R great toe. Pain with flexion and extension of R great toe.    Skin:     General: Skin is warm.   Neurological:      Mental Status: She is alert.           Diagnostic Study Results     Labs -  Recent Results (from the past 72 hours)   BMP    Collection Time: 07/14/24  2:35 PM   Result Value Ref Range    Sodium 141 136 - 145 mmol/L    Potassium 5.3 3.5 - 5.5 mmol/L    Chloride 104 98 - 107 mmol/L    CO2 28 21 - 32 mmol/L    Anion Gap 9 3.0 - 18.0 mmol/L    Glucose 278 (H) 74 - 108 mg/dL    BUN 22 6 - 23 MG/DL    Creatinine 9.11 0.6 - 1.3 MG/DL    BUN/Creatinine Ratio 24 (H) 12 - 20      Est, Glom Filt Rate 72 >60 ml/min/1.72m2    Calcium  9.6 8.6 - 10.2 MG/DL   CBC with Auto Differential    Collection Time: 07/14/24  2:35 PM   Result Value Ref Range    WBC 4.7 4.6 - 13.2 K/uL    RBC 4.91 4.20 - 5.30 M/uL    Hemoglobin 12.6 12.0 - 16.0 g/dL    Hematocrit 60.4 64.9 - 45.0 %    MCV 80.4 78.0 - 100.0 FL    MCH 25.7 24.0 - 34.0 PG    MCHC 31.9 31.0 - 37.0 g/dL    RDW 86.8 88.3 - 85.4 %    Platelets 190 135 - 420 K/uL    MPV 10.9 9.2 - 11.8 FL    Nucleated RBCs 0.0 0 PER 100 WBC    nRBC 0.00 0.00 - 0.01 K/uL    Neutrophils % 64.9 40.0 - 73.0 %    Lymphocytes % 18.1 (L) 21.0 - 52.0 %    Monocytes % 14.4 (H) 3.0 - 10.0 %    Eosinophils % 2.2 0.0 - 5.0 %    Basophils % 0.2 0.0 - 2.0 %    Immature Granulocytes % 0.2 0.0 - 0.5 %    Neutrophils Absolute 3.02 1.80 - 8.00 K/UL    Lymphocytes Absolute 0.84 (L) 0.90 - 3.60 K/UL    Monocytes Absolute 0.67 0.05 - 1.20 K/UL    Eosinophils Absolute 0.10 0.00 - 0.40 K/UL    Basophils Absolute 0.01 0.00 - 0.10 K/UL    Immature Granulocytes Absolute 0.01 0.00 - 0.04 K/UL  Differential Type AUTOMATED     Magnesium     Collection Time: 07/14/24  2:35 PM   Result Value Ref Range    Magnesium  1.9 1.6 - 2.6 mg/dL       Radiologic Studies -   XR KNEE LEFT (3 VIEWS)   Final Result      No clearly acute findings. Mild degenerative changes.  Evidence of small joint   effusion.      Electronically signed by Keller Soda      XR TOE RIGHT (MIN 2 VIEWS)   Final Result      Osteopenia limits evaluation of bony detail. Multifocal mild degenerative   changes. No clearly acute osseous findings. Notable dorsal forefoot soft tissue   swelling.      Electronically signed by Keller Soda            Medical Decision Making   I am the first provider for this patient.    I reviewed the vital signs, available nursing notes, past medical history, past surgical history, family history and social history.    Vital Signs-Reviewed the patient's vital signs.         ED Course: Progress Notes, Reevaluation, and Consults:    Provider Notes (Medical Decision Making):     MDM  Number of Diagnoses or Management Options  Chronic pain of left knee  Great toe pain, right  Diagnosis management comments: Differential includes fracture vs osteoarthritis vs effusion vs septic arthritis  Kalliopi Coupland is a 68 y.o. female with Pmhx of osteoarthritis, T2DM, anemia, diabetes, hyperlipidemia, stroke, CHF presenting due to concern of L knee pain and R great toe pain. VSS, physical exam with tenderness to palpation of R great toe and limited ROM of L knee and R great toe. No signs of erythema, significant warmth, leukocytosis, febrile temperature, or other concerning findings of septic arthritis. L knee XR shows evidence of small effusion and degenerative changes, R great toe XR shows notable dorsal forefoot soft tissue swelling, degenerative changes, but no fracture. Discussed findings with pt, sent prescription of Meloxicam  BID for 5 days as needed for pain, and encouraged multimodal method of pain management with voltaren gel, biofreeze, tylenol , and the meloxicam . Recommended pt follow up with their PCP for follow up on the osteopenia and return to orthopedics for the degenerative changes.          Medication List        START taking these medications      meloxicam  7.5 MG  tablet  Commonly known as: MOBIC   Take 1 tablet by mouth 2 times daily as needed for Pain            ASK your doctor about these medications      aspirin  325 MG tablet     atorvastatin  80 MG tablet  Commonly known as: LIPITOR   Take 1 tablet by mouth nightly     baclofen  10 MG tablet  Commonly known as: LIORESAL      carvedilol  6.25 MG tablet  Commonly known as: COREG   Take 1 tablet by mouth 2 times daily (with meals)     cinacalcet  30 MG tablet  Commonly known as: SENSIPAR      famotidine  20 MG tablet  Commonly known as: PEPCID      ferrous sulfate  325 (65 Fe) MG tablet  Commonly known as: IRON  325  Take 1 tablet by mouth daily     furosemide  40 MG tablet  Commonly known as: LASIX      hydrALAZINE   25 MG tablet  Commonly known as: APRESOLINE   Take 1 tablet by mouth every 8 hours     insulin  glargine 100 UNIT/ML injection vial  Commonly known as: LANTUS   Inject 10 Units into the skin daily     insulin  lispro 100 UNIT/ML Soln injection vial  Commonly known as: HUMALOG ,ADMELOG   Inject 3 Units into the skin 3 times daily (with meals)     Magnesium  Oxide 400 MG Caps     potassium chloride  10 MEQ extended release tablet  Commonly known as: KLOR-CON  M     PROBIOTIC BLEND PO     therapeutic multivitamin-minerals tablet  Take 1 tablet by mouth daily     vitamin C 500 MG tablet  Commonly known as: ASCORBIC ACID               Where to Get Your Medications        These medications were sent to [facility] [address] [place] - [address] - P [phone] - F [phone]  [address] [place], [place] [address]      Phone: [phone]   meloxicam  7.5 MG tablet              Patient was given the following medications:  Medications - No data to display    CONSULTS: (Who and What was discussed)  None    Chronic Conditions: osteoarthritis, T2DM, anemia, diabetes, hyperlipidemia, stroke, CHF     Social Determinants affecting Dx or Tx:     Records Reviewed (source and summary of external notes): Nursing Notes and Old  Medical Records    Procedures    Critical Care Time:     SCREENING TOOLS:  None    CLINICAL MANAGEMENT TOOLS:  Not Applicable      Diagnosis     Clinical Impression:   1. Chronic pain of left knee    2. Great toe pain, right        Disposition: Discharge home    Ike Olam [place], [place]  [address] [place] [address]  [phone]    Schedule an appointment as soon as possible for a visit in 2 days      Evlyn Elspeth BROCKS, MD  [address]  [address]  [place] [address]  [phone]    Schedule an appointment as soon as possible for a visit in 2 days         Disclaimer: Sections of this note are dictated using utilizing voice recognition software.  Minor typographical errors may be present. If questions arise, please do not hesitate to contact me or call our department.          "

## 2024-07-14 NOTE — ED Notes (Signed)
 Pt instructed to follow up with pcp.Take meds as rx'd.Return if worse

## 2024-07-14 NOTE — ED Triage Notes (Signed)
"  Pt brought to triage by daughter for stroke evaluation. Per daughter, pt complained of left arm weakness on Tuesday. Pt has equal grip in triage.  Denies numbness.    Pt also complained of left knee pain and right big toe pain.    Hx of stroke 47yrs ago.  Pt is on Aspirin .  "

## 2024-07-14 NOTE — Discharge Instructions (Addendum)
"  You were seen here for right great toe pain and L knee pain. You were not found to have a fracture, but you did have some soft tissue swelling. You can take meloxicam  twice daily as needed for pain. Please follow up with your orthopedic for and primary care for concern of osteopenia, or decreased bone density. Based on your work-up it was deemed that you were stable for discharge.   Please follow-up with your primary care physician if you have any further concerns and go over your work-up.  If you experience any chest pain, shortness of breath, worsening abdominal pain, vomiting blood, worsening headache, seizures, or any worsening of your symptoms please return to the emergency department immediately.  If you have any pending results or any further questions please contact the emergency department at [phone].    "

## 2024-07-19 ENCOUNTER — Inpatient Hospital Stay: Admit: 2024-07-19 | Payer: Medicare (Managed Care) | Primary: Physician Assistant

## 2024-07-19 NOTE — Progress Notes (Signed)
 "PHYSICAL / OCCUPATIONAL THERAPY - DAILY TREATMENT NOTE    Patient Name: [name] Short    Date: 07/19/2024    DOB: [birth year]  Ins[place]: [place] / Plan: [place] [employer] [place] [employer] [employer] DUAL / Product Type: *No Product type* /      Patient DOB verified Yes     Visit #   Current / Total 4 10   Time   In / Out 131 219   Pain   In / Out 8 7.5   Subjective Functional Status/Changes: Pt reports her right ankle and toe, and left knee are hurting. The toe is a 10.     TREATMENT AREA =  Decreased strength  Loss of balance    OBJECTIVE    Therapeutic Procedures:    Tx Min Billable or 1:1 Min (if diff from Tx Min) Procedure, Rationale, Specifics   16  97110 Therapeutic Exercise (timed):  increase ROM, strength, coordination, balance, and proprioception to improve patient's ability to progress to PLOF and address remaining functional goals. (see flow sheet as applicable)     Details if applicable:       20  97112 Neuromuscular Re-Education (timed):  improve balance, coordination, kinesthetic sense, posture, core stability and proprioception to improve patient's ability to develop conscious control of individual muscles and awareness of position of extremities in order to progress to PLOF and address remaining functional goals. (see flow sheet as applicable)     Details if applicable:  hip/scapular re-ed   12  97530 Therapeutic Activity (timed):  use of dynamic activities replicating functional movements to increase ROM, strength, coordination, balance, and proprioception in order to improve patient's ability to progress to PLOF and address remaining functional goals.  (see flow sheet as applicable)     Details if applicable:  functional reaching, safety with transfers   48  [facility] Totals Reminder: bill using total billable min of TIMED therapeutic procedures (example: do not include dry needle or estim unattended, both untimed codes, in totals to left)  8-22 min = 1 unit; 23-37 min = 2 units; 38-52 min = 3  units; 53-67 min = 4 units; 68-82 min = 5 units   Total Total     Charge Capture    [x]   Patient Education billed concurrently with other procedures   [x]  Review HEP    []  Progressed/Changed HEP, detail:    []  Other detail:       Objective Information/Functional Measures/Assessment    Patient reports increased pain primarily in right toe today and declined to perform standing exercises secondary to pain. Performed seated hip and scapular strengthening providing cuing to improve upright posture. Patient needed cuing throughout with transfers for hand placement to improve safety. She is due for reassessment at next visit- last authorized by insurance.    Patient will continue to benefit from skilled PT / OT services to modify and progress therapeutic interventions, analyze and address functional mobility deficits, analyze and address ROM deficits, analyze and address strength deficits, analyze and address soft tissue restrictions, analyze and cue for proper movement patterns, analyze and modify for postural abnormalities, analyze and address imbalance/dizziness, and instruct in home and community integration to address functional deficits and attain remaining goals.    Progress toward goals / Updated goals:  []   See Progress Note/Recertification    Pt will be able to improve strength in Hip flexion, seated hip ER/Adduction; knee extension/flexion to at least 5/5 to improve patient's ability to get in/out of  chair with efficiency at home & community. (Initial LTG)               RC Status: Hip Flexion: 4/5 B  (HIP: REGRESSION; Knee: PROGRESSION)                          Hip ER:  4-/5 B                          Hip Adduction: 4+/5 B                          Knee Flexion: 4+/5 L; 4/5 R                          Knee Extension: 5/5 B   Current: addressing with resisted hip and knee strengthening [Date assessed: 07/19/24]     2.  Pt will improve LEFS score to 31 to demonstrate improvement in patient's ability to perform  unrestricted ADLs/IADLs at home & community. (Initial LTG)              RC Status: 5 points (REGRESSION)              Current: reassess at next 30 days [Date assessed: 07/13/24]     3. Pt will report compliance with home HEP at end of care to enhance carry over of fuctional gains made with skilled therapy services in order to improve quality of life. (Initial LTG)               RC Status: Reports Daily Compliance - Update as needed (MET)     4. Pt will improve TUG score to at least 26 seconds with FWW to demonstrate a decrease in patients falls risk. (Initial LTG)              RC Status: 20 seconds - p! (REGRESSION)     5. Pt will improve her 5 time sit to stand time to least 23 seconds with UE to demonstrate improvement in her functional LE strength & decrease her risk for falls at home.   RC Status: 45 seconds with L knee pain (REGRESSION)     Next PN 07/27/24  RC or Medicaid tracking due 08/29/24  Auth due (visit number/ date) 1 more til 08/03/24    PLAN  - Continue Plan of Care    Ami JAYSON Furnish, PTA    07/19/2024    1:31 PM  If an interpreting service was utilized for treatment of this patient, the contents of this document represent the material reviewed with the patient via the interpreter.     Future Appointments   Date Time Provider Department Center   07/21/2024 12:10 PM Reather Chew, [place] [facility]   07/25/2024 12:10 PM Arlis Ozell [place] [facility]   07/27/2024 12:10 PM Arlis Ozell, PTA [facility]         "

## 2024-07-21 ENCOUNTER — Ambulatory Visit
Admit: 2024-07-21 | Discharge: 2024-07-21 | Payer: Medicare (Managed Care) | Attending: Specialist | Primary: Physician Assistant

## 2024-07-21 ENCOUNTER — Encounter: Payer: Medicare (Managed Care) | Primary: Physician Assistant

## 2024-07-21 VITALS — Ht 59.0 in | Wt 152.0 lb

## 2024-07-21 DIAGNOSIS — M25561 Pain in right knee: Principal | ICD-10-CM

## 2024-07-21 MED ORDER — BETAMETHASONE SOD PHOS & ACET 6 (3-3) MG/ML IJ SUSP
6 | Freq: Once | INTRAMUSCULAR | Status: AC
Start: 2024-07-21 — End: 2024-07-21
  Administered 2024-07-21: 21:00:00 3 mg via INTRA_ARTICULAR

## 2024-07-21 MED ORDER — BUPIVACAINE HCL 0.5 % IJ SOLN
0.5 | Freq: Once | INTRAMUSCULAR | Status: AC
Start: 2024-07-21 — End: 2024-07-21
  Administered 2024-07-21: 21:00:00 20 mL via INTRA_ARTICULAR
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
## 2024-07-21 NOTE — Progress Notes (Signed)
 Internal Medicine Attending:  I reviewed the AWV findings of the medical professional who conducted the visit. I was present in the office suite and immediately available to provide assistance and direction throughout the time the service was provided.

## 2024-07-25 ENCOUNTER — Inpatient Hospital Stay: Admit: 2024-07-25 | Payer: Medicare (Managed Care) | Primary: Physician Assistant

## 2024-07-25 NOTE — Therapy Recertification (Signed)
 "[facility] - [address]  [address]  [place], [place] [address]  [phone] fax    CONTINUED PLAN OF CARE/RECERTIFICATION FOR PHYSICAL THERAPY          Patient Name: [name] Short DOB: [birth year]   Treatment/Medical Diagnosis: Decreased strength [R53.1]  Loss of balance [R26.89]  Other abnormalities of gait and mobility [R26.89]   Onset Date: July 2025 - fall     Referral Source: Ike Olam [place], [place] Start of Care [facility]): 04/08/24   Prior Hospitalization: See Medical History     Prior Level of Function: Mod I with FWW, DTR/Aid with patient all day    Comorbidities: 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    Visits from [facility]: 24 Missed Visits: 1     Progress to Goals:  Pt will be able to improve strength in Hip flexion, seated hip ER/Adduction; knee extension/flexion to at least 5/5 to improve patient's ability to get in/out of chair with efficiency at home & community. (Initial LTG)               RC Status: Hip Flexion: 4/5 B  (HIP: REGRESSION; Knee: PROGRESSION)                          Hip ER:  4-/5 B                          Hip Adduction: 4+/5 B                          Knee Flexion: 4+/5 L; 4/5 R                          Knee Extension: 5/5 B              Current: addressing with resisted hip and knee strengthening [Date assessed: 07/19/24]   Progression   Hip ER:  4/5 B                          Hip Adduction: 4+/5 B                          Knee Flexion: 4+/5 L; 4/5 R                          Knee Extension: 5/5 B     2.  Pt will improve LEFS score to 31 to demonstrate improvement in patient's ability to perform unrestricted ADLs/IADLs at home & community. (Initial LTG)              RC Status: 5 points (REGRESSION)              Current: reassess at next 30 days [Date assessed: 07/13/24]               Progression : 11 points     3. Pt will report compliance with home HEP at end of care to enhance carry over of  fuctional gains made with skilled therapy services in order to improve quality of life. (Initial LTG)               RC Status: Reports  Daily Compliance - Update as needed (MET)               Progression: Performs HEP 1x per day.     4. Pt will improve TUG score to at least 26 seconds with FWW to demonstrate a decrease in patients falls risk. (Initial LTG)              RC Status: 20 seconds - p! (REGRESSION)               Progression : 1 min 10 sec p!     5. Pt will improve her 5 time sit to stand time to least 23 seconds with UE to demonstrate improvement in her functional LE strength & decrease her risk for falls at home.   RC Status: 45 seconds with L knee pain (REGRESSION)              Progression : 42 sec with L  knee discomfort     Key Functional Changes/Progress:     Functional Gains: Holding head up with ambulation,strength in B arms  Functional Deficits: Prolonged standing, cooking, cleaning, lifting pot off stove, bathing, reaching into cabinet ,  % improvement: 10%  Pain   Average: 4/10                  Best: 4/10                Worst: 8/10  Patient Goal: Pain free move better with less effort     Problem List: pain affecting function, decrease ROM, decrease strength, edema affection function, impaired gait/balance, decrease ADL/functional abilities, decrease activity tolerance, decrease flexibility/joint mobility, and decrease transfer abilities    Treatment Plan may include any combination of the following: 02889 Therapeutic Exercise, 97112 Neuromuscular Re-Education, 97140 Manual Therapy, 97530 Therapeutic Activity, 97535 Self Care/Home Management, 97014 Electrical Stim unattended / [phone] [facility]), and [id number] Gait Training    Primary mitigating factor which impeded progress:  None of these apply from preset list (refer to note for other details)    Patient Goal(s) has been updated and includes: Pain free move better with less effort     Goals for this certification period include and are to be  achieved in   5  weeks:    Pt will be able to improve strength in Hip flexion, seated hip ER/Adduction; knee extension/flexion to at least 5/5 to improve patient's ability to get in/out of chair with efficiency at home & community. (Initial LTG)               RC: addressing with resisted hip and knee strengthening [Date assessed: 07/19/24]   Progression   Hip ER:  4/5 B                          Hip Adduction: 4+/5 B                          Knee Flexion: 4+/5 L; 4/5 R                          Knee Extension: 5/5 B     2.  Pt will improve LEFS score to 31 to demonstrate improvement in patient's ability to perform unrestricted ADLs/IADLs at home & community. (Initial LTG)               RC: Progression :  11 points     3. Pt will report compliance with home HEP at end of care to enhance carry over of fuctional gains made with skilled therapy services in order to improve quality of life. (Initial LTG)                RC: Progression: Performs HEP 1x per day.     4. Pt will improve TUG score to at least 26 seconds with FWW to demonstrate a decrease in patients falls risk. (Initial LTG)               RC: Progression : 1 min 10 sec p!     5. Pt will improve her 5 time sit to stand time to least 23 seconds with UE to demonstrate improvement in her functional LE strength & decrease her risk for falls at home.               RC: Progression : 42 sec with L  knee discomfort     Frequency / Duration:   Patient to be seen   1-2   times per week for   8    weeks:  Frequency / Duration: Patient would benefit from skilled PT 1-2 times per week for 16 VISITS (2 times per week for 8 weeks/ 60 day cert) sessions as needed in this certification period.  Goals will be assigned and reassessed every 10 visits/ 30 days per Medicare guidelines    Assessments/Recommendations:     Pt reports 10% improvement since beginning PT with fair progress. Pt reports functional gains that include: Holding head up with ambulation,strength in B arms. Pt  reports functional deficits that include:  Prolonged standing, cooking, cleaning, lifting pot off stove, bathing, reaching into cabinet ,. Pt reports a recent average pain of 4/10, 8/10 at worst, and 4/10 at best. Objectively pt is progressing slow but steady with pain and strength at this time. Pt would benefit from continued skilled PT to address remaining functional deficits. We will continue with PT at 2x/wk for 5 weeks.     If you have any questions/comments please contact us  directly at [phone].   Thank you for allowing us  to assist in the care of your patient.    Certification Period: 07/25/24 - 09/2024  Reporting Period (date from last assessment to current assessment): 06/28/24 - 07/25/24  Next PN/RC: 08/23/24    Fonda Shay, PT       07/26/2024       7:39 AM      ___ I have read the above report and request that my patient continue as recommended.   ___ I have read the above report and request that my patient continue therapy with the following changes/special instructions: ________________________________________________   ___ I have read the above report and request that my patient be discharged from therapy.     Physician's Signature:_________________________   DATE:_________   TIME:________                           Ike Olam [place], [place]    ** Signature, Date and Time must be completed for valid certification **  Please sign and return to [facility] or you may fax the signed copy to [phone]  Thank you.      "

## 2024-07-25 NOTE — Progress Notes (Signed)
 "PHYSICAL / OCCUPATIONAL THERAPY - DAILY TREATMENT NOTE    Patient Name: [name] Short    Date: 07/25/2024    DOB: 1956/03/08  Insurance: [employer]* (Medicare Managed) /     Patient DOB verified Yes     Visit #   Current / Total 5 10   Time   In / Out 1210 1250   Pain   In / Out 8 8   Subjective Functional Status/Changes: Pt reports she is having a bad day.     TREATMENT AREA =  Decreased strength  Loss of balance    OBJECTIVE         Therapeutic Procedures:    Tx Min Billable or 1:1 Min (if diff from Tx Min) Procedure, Rationale, Specifics   20 20 97530 Therapeutic Activity (timed):  use of dynamic activities replicating functional movements to increase ROM, strength, coordination, balance, and proprioception in order to improve patient's ability to progress to PLOF and address remaining functional goals.  (see flow sheet as applicable)     Details if applicable:       10 10 97110 Therapeutic Exercise (timed):  increase ROM, strength, coordination, balance, and proprioception to improve patient's ability to progress to PLOF and address remaining functional goals. (see flow sheet as applicable)     Details if applicable:     10 10 97535 Self Care/Home Management (timed):  improve patient knowledge and understanding of home injury/symptom/pain management, positioning, posture/ergonomics, home safety, activity modification, transfer techniques, and joint protection strategies  to improve patient's ability to progress to PLOF and address remaining functional goals.  (see flow sheet as applicable)     Details if applicable:            Details if applicable:            Details if applicable:     40 40 MC BC Totals Reminder: bill using total billable min of TIMED therapeutic procedures (example: do not include dry needle or estim unattended, both untimed codes, in totals to left)  8-22 min = 1 unit; 23-37 min = 2 units; 38-52 min = 3 units; 53-67 min = 4 units; 68-82 min = 5 units   Total Total     Charge  Capture    [x]   Patient Education billed concurrently with other procedures   [x]  Review HEP    []  Progressed/Changed HEP, detail:    []  Other detail:       Objective Information/Functional Measures/Assessment  Functional Gains: Holding head up with ambulation,strength in B arms  Functional Deficits: Prolonged standing, cooking, cleaning, lifting pot off stove, bathing, reaching into cabinet ,  % improvement: 10%  Pain   Average: 4/10       Best: 4/10     Worst: 8/10  Patient Goal: Pain free move better with less effort     Pt reports 10% improvement since beginning PT with fair progress. Pt reports functional gains that include: Holding head up with ambulation,strength in B arms. Pt reports functional deficits that include:  Prolonged standing, cooking, cleaning, lifting pot off stove, bathing, reaching into cabinet ,. Pt reports a recent average pain of 4/10, 8/10 at worst, and 4/10 at best. Objectively pt is progressing slow but steady with pain and strength at this time. Pt would benefit from continued skilled PT to address remaining functional deficits. We will continue with PT at 2x/wk for 5 weeks.     Patient will continue to benefit from  skilled PT / OT services to modify and progress therapeutic interventions to address functional deficits and attain remaining goals.    Progress toward goals / Updated goals:  []   See Progress Note/Recertification    Pt will be able to improve strength in Hip flexion, seated hip ER/Adduction; knee extension/flexion to at least 5/5 to improve patient's ability to get in/out of chair with efficiency at home & community. (Initial LTG)               RC Status: Hip Flexion: 4/5 B  (HIP: REGRESSION; Knee: PROGRESSION)                          Hip ER:  4-/5 B                          Hip Adduction: 4+/5 B                          Knee Flexion: 4+/5 L; 4/5 R                          Knee Extension: 5/5 B              Current: addressing with resisted hip and knee strengthening  [Date assessed: 07/19/24]   Progression  Hip ER:  4/5 B                          Hip Adduction: 4+/5 B                          Knee Flexion: 4+/5 L; 4/5 R                          Knee Extension: 5/5 B    2.  Pt will improve LEFS score to 31 to demonstrate improvement in patient's ability to perform unrestricted ADLs/IADLs at home & community. (Initial LTG)              RC Status: 5 points (REGRESSION)              Current: reassess at next 30 days [Date assessed: 07/13/24]    Progression : 11 points    3. Pt will report compliance with home HEP at end of care to enhance carry over of fuctional gains made with skilled therapy services in order to improve quality of life. (Initial LTG)               RC Status: Reports Daily Compliance - Update as needed (MET)    Progression: Performs HEP 1x per day.    4. Pt will improve TUG score to at least 26 seconds with FWW to demonstrate a decrease in patients falls risk. (Initial LTG)              RC Status: 20 seconds - p! (REGRESSION)    Progression : 1 min 10 sec p!    5. Pt will improve her 5 time sit to stand time to least 23 seconds with UE to demonstrate improvement in her functional LE strength & decrease her risk for falls at home.   RC Status: 45 seconds with L knee pain (REGRESSION)   Progression : 42 sec with L  knee discomfort  Next PN 07/27/24  RC or Medicaid tracking due 08/29/24  Auth due (visit number/ date) pending    PLAN  - Continue Plan of Care    [name] Short, PTA    07/25/2024    12:24 PM  If an interpreting service was utilized for treatment of this patient, the contents of this document represent the material reviewed with the patient via the interpreter.     Future Appointments   Date Time Provider Department Center   07/27/2024 12:10 PM Short [name] [facility]ACKNEY [facility]         "

## 2024-07-25 NOTE — Telephone Encounter (Signed)
"  [facility] called to request clinicals for the patient, as they are attempting to re-submit under plan B for her [employer] Authorization.     Fax: [phone]  Attn: Ref # [id number]  "

## 2024-07-27 ENCOUNTER — Inpatient Hospital Stay: Admit: 2024-07-27 | Payer: Medicare (Managed Care) | Primary: Physician Assistant

## 2024-07-27 NOTE — Progress Notes (Signed)
 "PHYSICAL / OCCUPATIONAL THERAPY - DAILY TREATMENT NOTE    Patient Name: [name] Short    Date: 07/27/2024    DOB: [birth year]  Insurance: [employer] (Medicare Managed) /     Patient DOB verified Yes     Visit #   Current / Total 1 10   Time   In / Out 1210 1250   Pain   In / Out 7 7   Subjective Functional Status/Changes: Pt reports PT is helping with the pain.     TREATMENT AREA =  Decreased strength  Loss of balance    OBJECTIVE         Therapeutic Procedures:    Tx Min Billable or 1:1 Min (if diff from Tx Min) Procedure, Rationale, Specifics   15  97110 Therapeutic Exercise (timed):  increase ROM, strength, coordination, balance, and proprioception to improve patient's ability to progress to PLOF and address remaining functional goals. (see flow sheet as applicable)     Details if applicable:       15  97112 Neuromuscular Re-Education (timed):  improve balance, coordination, kinesthetic sense, posture, core stability and proprioception to improve patient's ability to develop conscious control of individual muscles and awareness of position of extremities in order to progress to PLOF and address remaining functional goals. (see flow sheet as applicable)     Details if applicable:     10  97530 Therapeutic Activity (timed):  use of dynamic activities replicating functional movements to increase ROM, strength, coordination, balance, and proprioception in order to improve patient's ability to progress to PLOF and address remaining functional goals.  (see flow sheet as applicable)     Details if applicable:            Details if applicable:            Details if applicable:     40  MC BC Totals Reminder: bill using total billable min of TIMED therapeutic procedures (example: do not include dry needle or estim unattended, both untimed codes, in totals to left)  8-22 min = 1 unit; 23-37 min = 2 units; 38-52 min = 3 units; 53-67 min = 4 units; 68-82 min = 5 units   Total Total     Charge Capture    [x]    Patient Education billed concurrently with other procedures   [x]  Review HEP    []  Progressed/Changed HEP, detail:    []  Other detail:       Objective Information/Functional Measures/Assessment    Pt reports no adverse effects from treatment session.Pt was able to perform all standing exercises in parallel bars with good strength and 1 rest break at this time.Pt needed frequent verbal and tactile cues to encourage upright postural awareness performing same.Pt is still well challenged performing NBOS for balance at this time.Reviewed HEP and pt was able to perform demo.Progress as able.    Patient will continue to benefit from skilled PT / OT services to modify and progress therapeutic interventions, analyze and address functional mobility deficits, analyze and address ROM deficits, analyze and address strength deficits, analyze and address soft tissue restrictions, analyze and cue for proper movement patterns, analyze and modify for postural abnormalities, analyze and address imbalance/dizziness, and instruct in home and community integration to address functional deficits and attain remaining goals.    Progress toward goals / Updated goals:  []   See Progress Note/Recertification    Pt will be able to improve strength in Hip flexion, seated hip  ER/Adduction; knee extension/flexion to at least 5/5 to improve patient's ability to get in/out of chair with efficiency at home & community. (Initial LTG)               RC: addressing with resisted hip and knee strengthening [Date assessed: 07/19/24]   Progression   Hip ER:  4/5 B                          Hip Adduction: 4+/5 B                          Knee Flexion: 4+/5 L; 4/5 R                          Knee Extension: 5/5 B     2.  Pt will improve LEFS score to 31 to demonstrate improvement in patient's ability to perform unrestricted ADLs/IADLs at home & community. (Initial LTG)               RC: Progression : 11 points     3. Pt will report compliance with home HEP at end  of care to enhance carry over of fuctional gains made with skilled therapy services in order to improve quality of life. (Initial LTG)                RC: Progression: Performs HEP 1x per day.     4. Pt will improve TUG score to at least 26 seconds with FWW to demonstrate a decrease in patients falls risk. (Initial LTG)               RC: Progression : 1 min 10 sec p!     5. Pt will improve [name] Short 5 time sit to stand time to least 23 seconds with UE to demonstrate improvement in [name] Short functional LE strength & decrease [name] Short risk for falls at home.               RC: Progression : 42 sec with L  knee discomfort     Next PN 08/23/24  RC or Medicaid tracking due 08/23/24  Auth due (visit number/ date) pending    PLAN  - Continue Plan of Care    Ozell Low, PTA    07/27/2024    12:16 PM  If an interpreting service was utilized for treatment of this patient, the contents of this document represent the material reviewed with the patient via the interpreter.     No future appointments.      "

## 2024-08-01 ENCOUNTER — Inpatient Hospital Stay: Admit: 2024-08-01 | Payer: Medicare (Managed Care) | Primary: Physician Assistant

## 2024-08-01 NOTE — Progress Notes (Signed)
 "PHYSICAL / OCCUPATIONAL THERAPY - DAILY TREATMENT NOTE    Patient Name: [name] Short    Date: 08/01/2024    DOB: 08-29-1955  Insurance: [employer]* (Medicare Managed) /     Patient DOB verified Yes     Visit #   Current / Total 2 10   Time   In / Out 305 343   Pain   In / Out 8 7.5   Subjective Functional Status/Changes: My big toe is hurting.     TREATMENT AREA =  Decreased strength  Loss of balance    OBJECTIVE         Therapeutic Procedures:    Tx Min Billable or 1:1 Min (if diff from Tx Min) Procedure, Rationale, Specifics   10 10 97110 Therapeutic Exercise (timed):  increase ROM, strength, coordination, balance, and proprioception to improve patient's ability to progress to PLOF and address remaining functional goals. (see flow sheet as applicable)     Details if applicable:       18 18 97112 Neuromuscular Re-Education (timed):  improve balance, coordination, kinesthetic sense, posture, core stability and proprioception to improve patient's ability to develop conscious control of individual muscles and awareness of position of extremities in order to progress to PLOF and address remaining functional goals. (see flow sheet as applicable)     Details if applicable:     10 10 97530 Therapeutic Activity (timed):  use of dynamic activities replicating functional movements to increase ROM, strength, coordination, balance, and proprioception in order to improve patient's ability to progress to PLOF and address remaining functional goals.  (see flow sheet as applicable)     Details if applicable:     38 38 MC BC Totals Reminder: bill using total billable min of TIMED therapeutic procedures (example: do not include dry needle or estim unattended, both untimed codes, in totals to left)  8-22 min = 1 unit; 23-37 min = 2 units; 38-52 min = 3 units; 53-67 min = 4 units; 68-82 min = 5 units   Total Total     Charge Capture    [x]   Patient Education billed concurrently with other procedures   [x]  Review HEP     []  Progressed/Changed HEP, detail:    []  Other detail:       Objective Information/Functional Measures/Assessment    Pt arrived 15 min late to treatment today so was unable to complete full session due to time constraints. She was unsteady on her feet today, needing several standing rest breaks and therapist assist to maintain upright standing upon walking in due to right great toe swelling and pain. Poor quality with step ups from 6 inch step with cuing to get her heel up on the step to help with posterior chain activation. Continues to require multiple seated rest breaks during exercises. Continue to progress as able.    Patient will continue to benefit from skilled PT / OT services to modify and progress therapeutic interventions, analyze and address functional mobility deficits, analyze and address ROM deficits, analyze and address strength deficits, analyze and address soft tissue restrictions, analyze and cue for proper movement patterns, analyze and modify for postural abnormalities, analyze and address imbalance/dizziness, and instruct in home and community integration to address functional deficits and attain remaining goals.    Progress toward goals / Updated goals:  []   See Progress Note/Recertification    Pt will be able to improve strength in Hip flexion, seated hip ER/Adduction; knee extension/flexion to at  least 5/5 to improve patient's ability to get in/out of chair with efficiency at home & community. (Initial LTG)               RC: addressing with resisted hip and knee strengthening [Date assessed: 07/19/24]   Progression   Hip ER:  4/5 B                          Hip Adduction: 4+/5 B                          Knee Flexion: 4+/5 L; 4/5 R                          Knee Extension: 5/5 B     2.  Pt will improve LEFS score to 31 to demonstrate improvement in patient's ability to perform unrestricted ADLs/IADLs at home & community. (Initial LTG)               RC: Progression : 11 points    Assess at 30  day mark [Date assessed: 08/01/2024]    3. Pt will report compliance with home HEP at end of care to enhance carry over of fuctional gains made with skilled therapy services in order to improve quality of life. (Initial LTG)                RC: Progression: Performs HEP 1x per day.     4. Pt will improve TUG score to at least 26 seconds with FWW to demonstrate a decrease in patients falls risk. (Initial LTG)               RC: Progression : 1 min 10 sec p!     5. Pt will improve her 5 time sit to stand time to least 23 seconds with UE to demonstrate improvement in her functional LE strength & decrease her risk for falls at home.               RC: Progression : 42 sec with L  knee discomfort     Next PN 08/23/2024  RC or Medicaid tracking due 09/21/2024  Auth due (visit number/ date) pending    PLAN  - Continue Plan of Care    [place], PTA, CSCS    08/01/2024    1:46 PM  If an interpreting service was utilized for treatment of this patient, the contents of this document represent the material reviewed with the patient via the interpreter.     Future Appointments   Date Time Provider Department Center   08/01/2024  2:50 PM Maryjane Pride, PT [facility]   08/03/2024  2:10 PM Arlis Ozell HACKNEY [facility]         "

## 2024-08-03 ENCOUNTER — Inpatient Hospital Stay: Admit: 2024-08-03 | Payer: Medicare (Managed Care) | Primary: Physician Assistant

## 2024-08-03 NOTE — Progress Notes (Signed)
 "PHYSICAL / OCCUPATIONAL THERAPY - DAILY TREATMENT NOTE    Patient Name: [name] Short    Date: 08/03/2024    DOB: 04-Mar-1956  Insurance: [employer]* (Medicare Managed) /     Patient DOB verified Yes     Visit #   Current / Total 3 16   Time   In / Out 210 250   Pain   In / Out 8 8   Subjective Functional Status/Changes: Pt reports PT is helping with the pain.     TREATMENT AREA =  Decreased strength  Loss of balance    OBJECTIVE         Therapeutic Procedures:    Tx Min Billable or 1:1 Min (if diff from Tx Min) Procedure, Rationale, Specifics   15 15 97110 Therapeutic Exercise (timed):  increase ROM, strength, coordination, balance, and proprioception to improve patient's ability to progress to PLOF and address remaining functional goals. (see flow sheet as applicable)     Details if applicable:       15 15 97112 Neuromuscular Re-Education (timed):  improve balance, coordination, kinesthetic sense, posture, core stability and proprioception to improve patient's ability to develop conscious control of individual muscles and awareness of position of extremities in order to progress to PLOF and address remaining functional goals. (see flow sheet as applicable)     Details if applicable:     10 10 97530 Therapeutic Activity (timed):  use of dynamic activities replicating functional movements to increase ROM, strength, coordination, balance, and proprioception in order to improve patient's ability to progress to PLOF and address remaining functional goals.  (see flow sheet as applicable)     Details if applicable:            Details if applicable:            Details if applicable:     40 40 MC BC Totals Reminder: bill using total billable min of TIMED therapeutic procedures (example: do not include dry needle or estim unattended, both untimed codes, in totals to left)  8-22 min = 1 unit; 23-37 min = 2 units; 38-52 min = 3 units; 53-67 min = 4 units; 68-82 min = 5 units   Total Total     Charge  Capture    [x]   Patient Education billed concurrently with other procedures   [x]  Review HEP    []  Progressed/Changed HEP, detail:    []  Other detail:       Objective Information/Functional Measures/Assessment    Pt reports no adverse effects from treatment session.Pt able to perform step 8 taps in parallel bars with fair balance and no LOB today.Increase reps with hip 4 ways with good strength and no elevation in pain at this time.Pt continues to be well challenged with performance of LAQ with 3# to date.Reviewed HEP and pt was able to perform demo.Progress as able.    Patient will continue to benefit from skilled PT / OT services to modify and progress therapeutic interventions, analyze and address functional mobility deficits, analyze and address ROM deficits, analyze and address strength deficits, analyze and address soft tissue restrictions, analyze and cue for proper movement patterns, analyze and modify for postural abnormalities, analyze and address imbalance/dizziness, and instruct in home and community integration to address functional deficits and attain remaining goals.    Progress toward goals / Updated goals:  []   See Progress Note/Recertification    Pt will be able to improve strength in Hip flexion, seated  hip ER/Adduction; knee extension/flexion to at least 5/5 to improve patient's ability to get in/out of chair with efficiency at home & community. (Initial LTG)               RC: addressing with resisted hip and knee strengthening [Date assessed: 07/19/24]   Progression   Hip ER:  4/5 B                          Hip Adduction: 4+/5 B                          Knee Flexion: 4+/5 L; 4/5 R                          Knee Extension: 5/5 B     2.  Pt will improve LEFS score to 31 to demonstrate improvement in patient's ability to perform unrestricted ADLs/IADLs at home & community. (Initial LTG)               RC: Progression : 11 points               Assess at 30 day mark [Date assessed: 08/01/2024]     3.  Pt will report compliance with home HEP at end of care to enhance carry over of fuctional gains made with skilled therapy services in order to improve quality of life. (Initial LTG)                RC: Progression: Performs HEP 1x per day.     4. Pt will improve TUG score to at least 26 seconds with FWW to demonstrate a decrease in patients falls risk. (Initial LTG)               RC: Progression : 1 min 10 sec p!     5. Pt will improve her 5 time sit to stand time to least 23 seconds with UE to demonstrate improvement in her functional LE strength & decrease her risk for falls at home.               RC: Progression : 42 sec with L  knee discomfort     Next PN 08/23/24  RC or Medicaid tracking due 09/21/24  Auth due (visit number/ date) 13 visits till 10/28/24    PLAN  - Continue Plan of Care    Ozell Low, PTA    08/03/2024    2:21 PM  If an interpreting service was utilized for treatment of this patient, the contents of this document represent the material reviewed with the patient via the interpreter.     No future appointments.      "

## 2024-08-10 ENCOUNTER — Inpatient Hospital Stay: Admit: 2024-08-10 | Payer: Medicare (Managed Care) | Primary: Physician Assistant

## 2024-08-10 DIAGNOSIS — R531 Weakness: Principal | ICD-10-CM

## 2024-08-10 NOTE — Progress Notes (Signed)
 PHYSICAL / OCCUPATIONAL THERAPY - DAILY TREATMENT NOTE    Patient Name: [name] Short    Date: 08/10/2024    DOB: May 22, 1956  Insurance: [employer]* (Medicare Managed) /     Patient DOB verified Yes     Visit #   Current / Total 4 16   Time   In / Out 1218 1256   Pain   In / Out 7 7   Subjective Functional Status/Changes: Pt reports her right toe is still hurting, but she's feeling a little better than yesterday.     TREATMENT AREA =  Decreased strength  Loss of balance    OBJECTIVE    Therapeutic Procedures:    Tx Min Billable or 1:1 Min (if diff from Tx Min) Procedure, Rationale, Specifics   14  97110 Therapeutic Exercise (timed):  increase ROM, strength, coordination, balance, and proprioception to improve patient's ability to progress to PLOF and address remaining functional goals. (see flow sheet as applicable)     Details if applicable:       10  97112 Neuromuscular Re-Education (timed):  improve balance, coordination, kinesthetic sense, posture, core stability and proprioception to improve patient's ability to develop conscious control of individual muscles and awareness of position of extremities in order to progress to PLOF and address remaining functional goals. (see flow sheet as applicable)     Details if applicable:  quad/HS/hip re-ed   14  97530 Therapeutic Activity (timed):  use of dynamic activities replicating functional movements to increase ROM, strength, coordination, balance, and proprioception in order to improve patient's ability to progress to PLOF and address remaining functional goals.  (see flow sheet as applicable)     Details if applicable:  functional reaching, standing functional activity   38  MC BC Totals Reminder: bill using total billable min of TIMED therapeutic procedures (example: do not include dry needle or estim unattended, both untimed codes, in totals to left)  8-22 min = 1 unit; 23-37 min = 2 units; 38-52 min = 3 units; 53-67 min = 4 units; 68-82 min = 5  units   Total Total     Charge Capture    [x]   Patient Education billed concurrently with other procedures   [x]  Review HEP    []  Progressed/Changed HEP, detail:    []  Other detail:       Objective Information/Functional Measures/Assessment    Patient reports improvement in pain today vs the last few days, stating her knees have been very painful and swollen. She demonstrates better gait speed today and improved postural awareness though still ambulates with flexed posture. Performed adductor ball squeezes with weighted ball today for increased challenge which she tolerated well. Able to perform some standing exercise today  but some activities held due to toe pain.    Patient will continue to benefit from skilled PT / OT services to modify and progress therapeutic interventions, analyze and address functional mobility deficits, analyze and address ROM deficits, analyze and address strength deficits, analyze and address soft tissue restrictions, analyze and cue for proper movement patterns, analyze and modify for postural abnormalities, analyze and address imbalance/dizziness, and instruct in home and community integration to address functional deficits and attain remaining goals.    Progress toward goals / Updated goals:  []   See Progress Note/Recertification    Pt will be able to improve strength in Hip flexion, seated hip ER/Adduction; knee extension/flexion to at least 5/5 to improve patient's ability to get in/out of  chair with efficiency at home & community. (Initial LTG)               RC: addressing with resisted hip and knee strengthening [Date assessed: 07/19/24]   Progression   Hip ER:  4/5 B                          Hip Adduction: 4+/5 B                          Knee Flexion: 4+/5 L; 4/5 R                          Knee Extension: 5/5 B     2.  Pt will improve LEFS score to 31 to demonstrate improvement in patient's ability to perform unrestricted ADLs/IADLs at home & community. (Initial LTG)                RC: Progression : 11 points               Assess at 30 day mark [Date assessed: 08/01/2024]     3. Pt will report compliance with home HEP at end of care to enhance carry over of fuctional gains made with skilled therapy services in order to improve quality of life. (Initial LTG)                RC: Progression: Performs HEP 1x per day.    Current: continued compliance [Date assessed: 08/10/24]  4. Pt will improve TUG score to at least 26 seconds with FWW to demonstrate a decrease in patients falls risk. (Initial LTG)               RC: Progression : 1 min 10 sec p!     5. Pt will improve her 5 time sit to stand time to least 23 seconds with UE to demonstrate improvement in her functional LE strength & decrease her risk for falls at home.               RC: Progression : 42 sec with L  knee discomfort      Next PN 08/23/24  RC or Medicaid tracking due 09/21/24  Auth due (visit number/ date) 12 visits till 10/28/24    PLAN  - Continue Plan of Care    Ami JAYSON Furnish, PTA    08/10/2024    11:07 AM  If an interpreting service was utilized for treatment of this patient, the contents of this document represent the material reviewed with the patient via the interpreter.     Future Appointments   Date Time Provider Department Center   08/10/2024 12:10 PM Furnish Mady JAYSON JOSETTA [facility]   08/12/2024  2:10 PM Furnish Mady JAYSON JOSETTA [facility]   08/16/2024 12:10 PM [facility] [address] [facility]   08/18/2024 12:10 PM Maryjane Pride, PT [facility]   08/23/2024 12:10 PM Maryjane Pride, PT [facility]   08/25/2024 12:10 PM Tresea Bouchard, PTA [facility]   08/30/2024 12:10 PM [facility] PT [address] [facility]   09/01/2024 12:10 PM Rex, Bouchard, PTA [facility]

## 2024-08-12 ENCOUNTER — Inpatient Hospital Stay: Payer: Medicare (Managed Care) | Primary: Physician Assistant

## 2024-08-15 ENCOUNTER — Emergency Department: Admit: 2024-08-15 | Discharge: 2024-08-23 | Payer: Medicare (Managed Care) | Primary: Physician Assistant

## 2024-08-15 ENCOUNTER — Inpatient Hospital Stay
Admit: 2024-08-15 | Discharge: 2024-08-17 | Disposition: A | Discharge status: Home / Self Care | Payer: Medicare (Managed Care) | Arrived: VH | Attending: Student in an Organized Health Care Education/Training Program

## 2024-08-15 DIAGNOSIS — R531 Weakness: Secondary | ICD-10-CM

## 2024-08-15 LAB — URINALYSIS, MICRO

## 2024-08-15 LAB — COVID-19 & INFLUENZA COMBO
Rapid Influenza A By PCR: NOT DETECTED
Rapid Influenza B By PCR: NOT DETECTED
SARS-CoV-2, PCR: NOT DETECTED

## 2024-08-15 LAB — URINALYSIS
Bilirubin, Urine: NEGATIVE
Glucose, Ur: NEGATIVE mg/dL
Ketones, Urine: NEGATIVE mg/dL
Leukocyte Esterase, Urine: NEGATIVE
Nitrite, Urine: NEGATIVE
Protein, UA: NEGATIVE mg/dL
Specific Gravity, UA: 1.011 (ref 1.005–1.030)
Urobilinogen, Urine: 0.2 EU/dL (ref 0.2–1.0)
pH, Urine: 7.5 (ref 5.0–8.0)

## 2024-08-15 NOTE — ED Provider Notes (Signed)
 EMERGENCY DEPARTMENT HISTORY AND PHYSICAL EXAM      Date: 08/15/2024  Patient Name: [name] Short    History of Presenting Illness     Chief Complaint   Patient presents with    Generalized Weakness    Vomiting    Nasal Congestion    Chills       History (Context): [name] Short is a 69 y.o. female  with pmhx of osteoarthritis, T2DM, anemia, HLD, stroke w/ residual right sided deficits, CHF, lymphedema who presents to the ED with her daughter today with multiple complaints of generalized weakness, vomiting, runny nose and swelling and pain of her right LE and pain of her right great toe. Daughter states she has been having a runny nose for the last 3-4 days, she then vomited last night. Daughter states at that time she also seemed somewhat confused but notes she seems back to normal today. Patient denies any fevers, chills, CP, or SOB since the onset of her symptoms. Daughter does not that her home aide has been coughing recently. Patient also endorses difficulty standing that she attributes to both generalized weakness and pain and swelling of her RLE. She has a history of lymphedema and sees Dr. Jacquline for this. She notes her right leg is usually more swollen but seems worsen than her usual accompanied by some pain, daughter notes they have been thinking about doubling her fluid pill. She usually uses a lymphedema device at home for about an hour but was only able to use it for 30 mins due to pain. Daughter notes she has a hx of blood clots in the right leg and cellulitis. She has also had recent issues with pain of her right great toe which they believe to be due to it bing stepped on multiple times accidentally by her aide. Attends PT twice weekly.      PCP: Ike Olam BIRCH, PA    Current Facility-Administered Medications   Medication Dose Route Frequency Provider Last Rate Last Admin    aspirin  tablet 325 mg  325 mg Oral NOW Wende Laundry D, MD        [START ON 08/16/2024] insulin  glargine (LANTUS ) injection  vial 20 Units  20 Units SubCUTAneous Daily Wende, Raef Sprigg D, MD        carvedilol  (COREG ) tablet 6.25 mg  6.25 mg Oral BID Clerance Umland D, MD        furosemide  (LASIX ) tablet 40 mg  40 mg Oral NOW Wende, Nakota Elsen D, MD        cinacalcet  (SENSIPAR ) tablet 30 mg  30 mg Oral Nightly Jhony Antrim D, MD        [START ON 08/16/2024] potassium chloride  (KLOR-CON  M) extended release tablet 10 mEq  10 mEq Oral Daily Gaspard Isbell D, MD        [START ON 08/16/2024] topiramate  (TOPAMAX ) tablet 100 mg  100 mg Oral Daily Royden Bulman D, MD        baclofen  (LIORESAL ) tablet 15 mg  15 mg Oral TID Adeana Grilliot D, MD        donepezil  (ARICEPT ) tablet 5 mg  5 mg Oral Nightly Jetty Berland D, MD        [START ON 08/16/2024] modafinil  (PROVIGIL ) tablet 100 mg  100 mg Oral Daily Vasiliy Mccarry D, MD        [START ON 08/16/2024] Magnesium  Oxide (MAGNESIUM -OXIDE) 500 mg  500 mg Oral Daily Wende Laundry BIRCH, MD         Current Outpatient  Medications   Medication Sig Dispense Refill    meloxicam  (MOBIC ) 7.5 MG tablet Take 1 tablet by mouth 2 times daily as needed for Pain 10 tablet 0    Probiotic Product (PROBIOTIC BLEND PO) Take by mouth Daily      famotidine  (PEPCID ) 20 MG tablet TAKE 1 TABLET BY MOUTH ONCE A DAY FOR GERD      vitamin C (ASCORBIC ACID) 500 MG tablet Take 1 tablet by mouth daily      insulin  glargine (LANTUS ) 100 UNIT/ML injection vial Inject 10 Units into the skin daily 1 each 0    insulin  lispro (HUMALOG ) 100 UNIT/ML SOLN injection vial Inject 3 Units into the skin 3 times daily (with meals) 1 each 0    hydrALAZINE  (APRESOLINE ) 25 MG tablet Take 1 tablet by mouth every 8 hours 90 tablet 3    potassium chloride  (KLOR-CON  M) 10 MEQ extended release tablet Take 1 tablet by mouth daily      cinacalcet  (SENSIPAR ) 30 MG tablet Take 1 tablet by mouth 2 times daily      carvedilol  (COREG ) 6.25 MG tablet Take 1 tablet by mouth 2 times daily (with meals) 60 tablet 3    baclofen  (LIORESAL ) 10 MG tablet Take 1 tablet by mouth 3  times daily as needed (PAIN) Indications: Pain FOR PAIN PER SHETH      Magnesium  Oxide 400 MG CAPS Take 1 capsule by mouth daily.      aspirin  325 MG tablet Take 1 tablet by mouth daily      atorvastatin  (LIPITOR ) 80 MG tablet Take 1 tablet by mouth nightly 30 tablet 3    ferrous sulfate  (IRON  325) 325 (65 Fe) MG tablet Take 1 tablet by mouth daily 30 tablet     Multiple Vitamins-Minerals (THERAPEUTIC MULTIVITAMIN-MINERALS) tablet Take 1 tablet by mouth daily      furosemide  (LASIX ) 40 MG tablet furosemide  40 mg tablet   TAKE 1 TABLET BY MOUTH EVERY DAY         Past History     Past Medical History:   Past Medical History:   Diagnosis Date    Anemia     Arthritis     Chronic pain     Legs and Shoulders    Diabetes (HCC)     Exposure to asbestos     History of blood transfusion     History of colon polyps     HTN (hypertension)     Hx of blood clots     DVT    Hyperlipemia     Menopause     Pulmonary nodule     Serum calcium  elevated     Sleep apnea     not using cpap    Stroke (HCC) 12/2021    left sided weakness       Past Surgical History:  Past Surgical History:   Procedure Laterality Date    BACK SURGERY N/A 06/24/2022    INCISION AND DRAINAGE /DEBRIDEMENT SACRAL DECUBITUS performed by Reda Garre, DO at [facility] MAIN OR    CESAREAN SECTION  1987    COLONOSCOPY N/A 01/13/2017    COLONOSCOPY performed by Debby Dun, MD at [facility] ENDOSCOPY    COLONOSCOPY      COLONOSCOPY N/A 04/08/2023    COLONOSCOPY DIAGNOSTIC performed by Gerlean Norleen Anes, MD at [facility] ENDOSCOPY    DILATION AND CURETTAGE OF UTERUS  1995    RECTAL SURGERY N/A 12/28/2021    SACRAL WOUND DEBRIDEMENT INCISION AND  DRAINAGE performed by Nat Lull, DO at [facility] MAIN OR    SPINE SURGERY N/A 12/09/2021    CERVICAL THREE/FOUR/FIVE/SIX LAMINECTOMY FUSION; C-ARM; STRYKER; EXT BONE GROWTH STIM; 23 HR performed by Oneil KATHEE March, MD at [facility] MAIN OR       Family History:  Family History   Problem Relation Age of Onset    Diabetes Other 39        parent, NOS     Cancer Father     Hypertension Other 35        parent,NOS    Heart Disease Other 1        parent, NOS    Lung Disease Father        Social History:   Social History     Tobacco Use    Smoking status: Former     Current packs/day: 1.00     Average packs/day: 1 pack/day for 34.3 years (34.3 ttl pk-yrs)     Types: Cigarettes, Cigars     Start date: 04/12/1990    Smokeless tobacco: Never   Vaping Use    Vaping status: Never Used   Substance Use Topics    Alcohol use: Not Currently     Comment: occ    Drug use: Never       Allergies:  Allergies   Allergen Reactions    Lactose Other (See Comments)     Intolerance         Physical Exam     Vitals:    08/15/24 1631 08/15/24 2012   BP: 134/77 133/76   Pulse: 55 69   Resp: 16 16   Temp: 97.8 F (36.6 C) 97.5 F (36.4 C)   TempSrc: Oral Oral   SpO2: 100% 100%   Weight: 69.4 kg (153 lb)    Height: 1.499 m (4' 11)        Physical Exam  Constitutional:       Appearance: Normal appearance.   HENT:      Head: Normocephalic and atraumatic.   Cardiovascular:      Rate and Rhythm: Normal rate.   Pulmonary:      Effort: Pulmonary effort is normal.      Breath sounds: Normal breath sounds.   Musculoskeletal:      Cervical back: Neck supple.      Right knee: Swelling present. No erythema. Tenderness present.      Right lower leg: Swelling and tenderness present. 2+ Pitting Edema present.   Skin:     General: Skin is warm and dry.   Neurological:      General: No focal deficit present.      Mental Status: She is alert and oriented to person, place, and time.         Diagnostic Study Results     Labs -     Recent Results (from the past 12 hours)   COVID-19 & Influenza Combo    Collection Time: 08/15/24  4:30 PM    Specimen: Nasopharynx   Result Value Ref Range    Source Nasopharyngeal      SARS-CoV-2, PCR Not detected NOTD      Rapid Influenza A By PCR Not detected NOTD      Rapid Influenza B By PCR Not detected NOTD     Urinalysis    Collection Time: 08/15/24  4:30 PM   Result Value Ref  Range    Color, UA YELLOW      Appearance CLEAR  Specific Gravity, UA 1.011 1.005 - 1.030      pH, Urine 7.5 5.0 - 8.0      Protein, UA Negative NEG mg/dL    Glucose, Ur Negative NEG mg/dL    Ketones, Urine Negative NEG mg/dL    Bilirubin, Urine Negative NEG      Blood, Urine SMALL (A) NEG      Urobilinogen, Urine 0.2 0.2 - 1.0 EU/dL    Nitrite, Urine Negative NEG      Leukocyte Esterase, Urine Negative NEG     Urinalysis, Micro    Collection Time: 08/15/24  4:30 PM   Result Value Ref Range    WBC, UA 0-3 0 - 5 /hpf    RBC, UA 21-35 0 - 5 /hpf    Epithelial Cells, UA 3+ 0 - 5 /lpf    BACTERIA, URINE FEW (A) NEG /hpf   Vascular duplex lower extremity venous right    Collection Time: 08/15/24  7:38 PM   Result Value Ref Range    Body Surface Area 1.7 m2   CBC with Auto Differential    Collection Time: 08/15/24  7:56 PM   Result Value Ref Range    WBC 4.4 (L) 4.6 - 13.2 K/uL    RBC 5.35 (H) 4.20 - 5.30 M/uL    Hemoglobin 13.5 12.0 - 16.0 g/dL    Hematocrit 56.8 64.9 - 45.0 %    MCV 80.6 78.0 - 100.0 FL    MCH 25.2 24.0 - 34.0 PG    MCHC 31.3 31.0 - 37.0 g/dL    RDW 86.5 88.3 - 85.4 %    Platelets 194 135 - 420 K/uL    MPV 10.2 9.2 - 11.8 FL    Nucleated RBCs 0.0 0 PER 100 WBC    nRBC 0.00 0.00 - 0.01 K/uL    Neutrophils % 64.0 40.0 - 73.0 %    Lymphocytes % 20.8 (L) 21.0 - 52.0 %    Monocytes % 11.8 (H) 3.0 - 10.0 %    Eosinophils % 2.7 0.0 - 5.0 %    Basophils % 0.5 0.0 - 2.0 %    Immature Granulocytes % 0.2 0.0 - 0.5 %    Neutrophils Absolute 2.81 1.80 - 8.00 K/UL    Lymphocytes Absolute 0.92 0.90 - 3.60 K/UL    Monocytes Absolute 0.52 0.05 - 1.20 K/UL    Eosinophils Absolute 0.12 0.00 - 0.40 K/UL    Basophils Absolute 0.02 0.00 - 0.10 K/UL    Immature Granulocytes Absolute 0.01 0.00 - 0.04 K/UL    Differential Type SMEAR SCANNED      Platelet Comment ADEQUATE PLATELETS      RBC Comment NORMOCYTIC, NORMOCHROMIC     BMP    Collection Time: 08/15/24  7:56 PM   Result Value Ref Range    Sodium 141 136 - 145 mmol/L     Potassium 4.6 3.5 - 5.5 mmol/L    Chloride 107 98 - 107 mmol/L    CO2 24 21 - 32 mmol/L    Anion Gap 10 3.0 - 18.0 mmol/L    Glucose 87 74 - 108 mg/dL    BUN 21 6 - 23 MG/DL    Creatinine 9.12 0.6 - 1.3 MG/DL    BUN/Creatinine Ratio 24 (H) 12 - 20      Est, Glom Filt Rate 73 >60 ml/min/1.64m2    Calcium  10.6 (H) 8.6 - 10.2 MG/DL   BNP    Collection Time: 08/15/24  7:56  PM   Result Value Ref Range    NT Pro-BNP 124 36 - 900 PG/ML      Labs Reviewed   URINALYSIS - Abnormal; Notable for the following components:       Result Value    Blood, Urine SMALL (*)     All other components within normal limits   URINALYSIS, MICRO - Abnormal; Notable for the following components:    BACTERIA, URINE FEW (*)     All other components within normal limits   CBC WITH AUTO DIFFERENTIAL - Abnormal; Notable for the following components:    WBC 4.4 (*)     RBC 5.35 (*)     Lymphocytes % 20.8 (*)     Monocytes % 11.8 (*)     All other components within normal limits   BASIC METABOLIC PANEL - Abnormal; Notable for the following components:    BUN/Creatinine Ratio 24 (*)     Calcium  10.6 (*)     All other components within normal limits   COVID-19 & INFLUENZA COMBO   CULTURE, URINE   BRAIN NATRIURETIC PEPTIDE       Radiologic Studies -   Vascular duplex lower extremity venous right               The laboratory results, imaging results, and other diagnostic exams were reviewed in the EMR.    Medical Decision Making   I am the first provider for this patient.    I reviewed the vital signs, available nursing notes, past medical history, past surgical history, family history and social history.    Vital Signs-Reviewed the patient's vital signs.         Records Reviewed: Personally, on initial evaluation    MDM:   DDX includes but is not limited to: Covid 19 infection, influenza, PNA, DVT, UTI, lymphedema, cellulitis, others       This is a 69 yo female with pmhx of osteoarthritis, T2DM, anemia, HLD, stroke w/ residual right sided deficits,  CHF, lymphedema presenting with multiple complaints. On exam she has 2-3+ pitting edema of RLE and 2+ pitting edema of LLE. Some pain to palpation of right calf without significant eythema. Will check a PVL of her RLE to rule out DVT given report of increased pain the last 2-3 days. Will check CBC, BMP to evaluate for electrolyte abnormalities and possible infectious etiology but lower suspicion for this based on initial presentation with reassuring vitals. No increased work of breathing and she is alert dn oriented x3. Will check Covid and flu given complaint of generalized weakness and rhinorrhea although patient with goo strength on exam with expected right sided deficits from previous CVA.  Given hx of CHF will also check BNP.    Workup shows UA not suggestive of UTI but will check urine culture. Nasal swab negative for flu and Covid. Lab work significant for CBC with WBC of 4.4, BMP with BUN consistent with baseline per review of previous values, BNP wnl. Overall reassuring. PVL showed no evidence of DVT.      Discussed overall reassuring workup with patient and daughter and suspicion for viral illness as etiology for patient's symptoms. Given patient's continued generalized weakness she elected to stay overnight for evaluation by PT/OT and CM in the morning for possible home health services. Will place under ED observation.        Orders as below:  Orders Placed This Encounter   Procedures    COVID-19 & Influenza Combo  Culture, Urine    Urinalysis    Urinalysis, Micro    CBC with Auto Differential    BMP    BNP    Inpatient consult to Case Management    OT eval and treat    PT eval and treat    Vascular duplex lower extremity venous right          ED Course:   ED Course as of 08/15/24 2237   Mon Aug 15, 2024   1813 On initial evaluation patient is A&Ox3, complains of RLE pain and swelling, 2-3 days of rhinorrhea. UA not suggestive of UTI but will check culture. Flu and Covid swab negative. Will  check CBC,  BMP, BNP, and PVL of RLE. [RI]   2124 Lab work shows BNP wnl at 124, CBC with WBC of 4.4, BMP with BUN 24 consistent with baseline. [RI]   2236 PVL without evidence for DVT. Discussed overall reassuring workup with patient and daughter. She would like to stay overnight for evaluation by PT/OT and CM in the AM. ED observation. [RI]      ED Course User Index  [RI] Wende Laundry D, MD         Is this patient to be included in the SEP-1 core measure? No Exclusion criteria - the patient is NOT to be included for SEP-1 Core Measure due to: Infection is not suspected      Procedures:  Procedures      Rhythm interpretation from monitor:       Social Determinants of Health: none       Supplemental Historians include: Patient's daughter       Documentation/Prior Results Review:  Old medical records.  Nursing notes.  Previous radiology studies.      Discussion of Mangement with other Physicians, QHP or Appropriate Source:  None        Diagnosis and Disposition     CLINICAL IMPRESSION:  No diagnosis found.     Medication List        ASK your doctor about these medications      aspirin  325 MG tablet     atorvastatin  80 MG tablet  Commonly known as: LIPITOR   Take 1 tablet by mouth nightly     baclofen  10 MG tablet  Commonly known as: LIORESAL      carvedilol  6.25 MG tablet  Commonly known as: COREG   Take 1 tablet by mouth 2 times daily (with meals)     cinacalcet  30 MG tablet  Commonly known as: SENSIPAR      famotidine  20 MG tablet  Commonly known as: PEPCID      ferrous sulfate  325 (65 Fe) MG tablet  Commonly known as: IRON  325  Take 1 tablet by mouth daily     furosemide  40 MG tablet  Commonly known as: LASIX      hydrALAZINE  25 MG tablet  Commonly known as: APRESOLINE   Take 1 tablet by mouth every 8 hours     insulin  glargine 100 UNIT/ML injection vial  Commonly known as: LANTUS   Inject 10 Units into the skin daily     insulin  lispro 100 UNIT/ML Soln injection vial  Commonly known as: HUMALOG ,ADMELOG   Inject 3 Units into the  skin 3 times daily (with meals)     Magnesium  Oxide 400 MG Caps     meloxicam  7.5 MG tablet  Commonly known as: MOBIC   Take 1 tablet by mouth 2 times daily as needed for Pain     potassium chloride  10 MEQ  extended release tablet  Commonly known as: KLOR-CON  M     PROBIOTIC BLEND PO     therapeutic multivitamin-minerals tablet  Take 1 tablet by mouth daily     vitamin C 500 MG tablet  Commonly known as: ASCORBIC ACID              Disposition: ED observation    Patient condition at time of disposition: Stable    DISCHARGE NOTE:   Pt has been reexamined. Patient has no new complaints, changes, or physical findings.  Care plan outlined and precautions discussed.  Results were reviewed with the patient. All medications were reviewed with the patient. All of pt's questions and concerns were addressed.  Alarm symptoms and return precautions associated with chief complaint and evaluation were reviewed with the patient in detail.  The patient demonstrated adequate understanding.  Patient was instructed to follow up with PCP, as well as given strict return precautions to the ED upon further deterioration. Patient is ready for discharge home.          Dragon Disclaimer     Please note that this dictation was completed with Dragon, the advertising account planner.  Quite often unanticipated grammatical, syntax, homophones, and other interpretive errors are inadvertently transcribed by the computer software.  Please disregard these errors.  Please excuse any errors that have escaped final proofreading.      Darvin Paula, MD  [facility] PGY-2

## 2024-08-15 NOTE — ED Triage Notes (Signed)
 Pt accompanied to triage by daughter c/o generalized weakness, vomiting nasal congestion and chills since last night.

## 2024-08-15 NOTE — ED Notes (Signed)
 Departed from the ED to CT department, via wheelchair, accompanied by hospital transport, in stable condition.

## 2024-08-15 NOTE — ED Provider Notes (Signed)
 ED Course as of 08/16/24 1424   Mon Aug 15, 2024   1813 On initial evaluation patient is A&Ox3, complains of RLE pain and swelling, 2-3 days of rhinorrhea. UA not suggestive of UTI but will check culture. Flu and Covid swab negative. Will  check CBC, BMP, BNP, and PVL of RLE. [RI]   2124 Lab work shows BNP wnl at 124, CBC with WBC of 4.4, BMP with BUN 24 consistent with baseline. [RI]   2236 PVL without evidence for DVT. Discussed overall reassuring workup with patient and daughter. She would like to stay overnight for evaluation by PT/OT and CM in the AM. ED observation. [RI]   Tue Aug 16, 2024   1421 Dr. Willie taking over for patient care. Spoke with case management who states home health orders need to be placed.  Can be discharged to go home and will continue to follow patient's need for skilled nursing facility over the next 72 hours.  Patient's family member agreeable.  Will discharge and begin transport at this time. [DV]      ED Course User Index  [DV] Willie Alm DELENA Mickey., DO  [RI] Wende Darvin BIRCH, MD          Willie Alm DELENA Mickey., DO  08/16/24 1424

## 2024-08-15 NOTE — ED Provider Notes (Signed)
 ED continued care note    12:13 AM EST  Signed out to me by Dr Sean at shift change, patient with a gradual decline no longer able to transfer into the car cannot care for herself pending placement    Reviewed at 12:13 AM EST  Patient Vitals for the past 12 hrs:   Temp Pulse Resp BP SpO2   08/15/24 2012 97.5 F (36.4 C) 69 16 133/76 100 %   08/15/24 1631 97.8 F (36.6 C) 55 16 134/77 100 %       ED Course as of 09/03/24 0826   Mon Aug 15, 2024   1813 On initial evaluation patient is A&Ox3, complains of RLE pain and swelling, 2-3 days of rhinorrhea. UA not suggestive of UTI but will check culture. Flu and Covid swab negative. Will  check CBC, BMP, BNP, and PVL of RLE. [RI]   2124 Lab work shows BNP wnl at 124, CBC with WBC of 4.4, BMP with BUN 24 consistent with baseline. [RI]   2236 PVL without evidence for DVT. Discussed overall reassuring workup with patient and daughter. She would like to stay overnight for evaluation by PT/OT and CM in the AM. ED observation. [RI]   Tue Aug 16, 2024   1421 Dr. Willie taking over for patient care. Spoke with case management who states home health orders need to be placed.  Can be discharged to go home and will continue to follow patient's need for skilled nursing facility over the next 72 hours.  Patient's family member agreeable.  Will discharge and begin transport at this time. [DV]      ED Course User Index  [DV] Willie Alm DELENA Mickey., DO  [RI] Wende Darvin BIRCH, MD       Clinical Impression:   1. Generalized weakness         Bianca Carlin FERNS, MD  09/03/24 [phone]

## 2024-08-16 ENCOUNTER — Inpatient Hospital Stay: Payer: Medicare (Managed Care) | Primary: Physician Assistant

## 2024-08-16 LAB — POCT GLUCOSE
POC Glucose: 174 mg/dL — ABNORMAL HIGH (ref 70–110)
POC Glucose: 442 mg/dL (ref 70–110)

## 2024-08-16 LAB — CBC WITH AUTO DIFFERENTIAL
Basophils %: 0.5 % (ref 0.0–2.0)
Basophils Absolute: 0.02 K/UL (ref 0.00–0.10)
Eosinophils %: 2.7 % (ref 0.0–5.0)
Eosinophils Absolute: 0.12 K/UL (ref 0.00–0.40)
Hematocrit: 43.1 % (ref 35.0–45.0)
Hemoglobin: 13.5 g/dL (ref 12.0–16.0)
Immature Granulocytes %: 0.2 % (ref 0.0–0.5)
Immature Granulocytes Absolute: 0.01 K/UL (ref 0.00–0.04)
Lymphocytes %: 20.8 % — ABNORMAL LOW (ref 21.0–52.0)
Lymphocytes Absolute: 0.92 K/UL (ref 0.90–3.60)
MCH: 25.2 pg (ref 24.0–34.0)
MCHC: 31.3 g/dL (ref 31.0–37.0)
MCV: 80.6 FL (ref 78.0–100.0)
MPV: 10.2 FL (ref 9.2–11.8)
Monocytes %: 11.8 % — ABNORMAL HIGH (ref 3.0–10.0)
Monocytes Absolute: 0.52 K/UL (ref 0.05–1.20)
Neutrophils %: 64 % (ref 40.0–73.0)
Neutrophils Absolute: 2.81 K/UL (ref 1.80–8.00)
Nucleated RBCs: 0 /100{WBCs}
Platelet Comment: ADEQUATE
Platelets: 194 K/uL (ref 135–420)
RBC: 5.35 M/uL — ABNORMAL HIGH (ref 4.20–5.30)
RDW: 13.4 % (ref 11.6–14.5)
WBC: 4.4 K/uL — ABNORMAL LOW (ref 4.6–13.2)
nRBC: 0 K/uL (ref 0.00–0.01)

## 2024-08-16 LAB — BASIC METABOLIC PANEL
Anion Gap: 10 mmol/L (ref 3.0–18.0)
BUN/Creatinine Ratio: 24 — ABNORMAL HIGH (ref 12–20)
BUN: 21 mg/dL (ref 6–23)
CO2: 24 mmol/L (ref 21–32)
Calcium: 10.6 mg/dL — ABNORMAL HIGH (ref 8.6–10.2)
Chloride: 107 mmol/L (ref 98–107)
Creatinine: 0.87 mg/dL (ref 0.6–1.3)
Est, Glom Filt Rate: 73 ml/min/1.73m2 (ref >60)
Glucose: 87 mg/dL (ref 74–108)
Potassium: 4.6 mmol/L (ref 3.5–5.5)
Sodium: 141 mmol/L (ref 136–145)

## 2024-08-16 LAB — BRAIN NATRIURETIC PEPTIDE: NT Pro-BNP: 124 pg/mL (ref 36–900)

## 2024-08-16 MED ORDER — GLUCAGON EMERGENCY 1 MG/ML IJ SOLR
1 | INTRAMUSCULAR | Status: DC | PRN
Start: 2024-08-16 — End: 2024-08-17

## 2024-08-16 MED ORDER — MAGNESIUM OXIDE -MG SUPPLEMENT 400 (240 MG) MG PO TABS
400 | Freq: Every day | ORAL | Status: DC
Start: 2024-08-16 — End: 2024-08-17
  Administered 2024-08-16: 15:00:00 400 mg via ORAL

## 2024-08-16 MED ORDER — MAGNESIUM GLUCONATE 500 MG PO TABS
500 | Freq: Every day | ORAL | Status: DC
Start: 2024-08-16 — End: 2024-08-15

## 2024-08-16 MED ORDER — DONEPEZIL HCL 5 MG PO TABS
5 | Freq: Every evening | ORAL | Status: DC
Start: 2024-08-16 — End: 2024-08-17
  Administered 2024-08-16: 07:00:00 5 mg via ORAL

## 2024-08-16 MED ORDER — CARVEDILOL 6.25 MG PO TABS
6.25 | Freq: Two times a day (BID) | ORAL | Status: DC
Start: 2024-08-16 — End: 2024-08-17
  Administered 2024-08-16 (×2): 6.25 mg via ORAL

## 2024-08-16 MED ORDER — BACLOFEN 10 MG PO TABS
10 | Freq: Three times a day (TID) | ORAL | Status: DC
Start: 2024-08-16 — End: 2024-08-17
  Administered 2024-08-16 (×3): 15 mg via ORAL

## 2024-08-16 MED ORDER — GLUCOSE 4 G PO CHEW
4 | ORAL | Status: DC | PRN
Start: 2024-08-16 — End: 2024-08-17

## 2024-08-16 MED ORDER — DEXTROSE 10 % IV BOLUS
INTRAVENOUS | Status: DC | PRN
Start: 2024-08-16 — End: 2024-08-17

## 2024-08-16 MED ORDER — TOPIRAMATE 100 MG PO TABS
100 | Freq: Every day | ORAL | Status: DC
Start: 2024-08-16 — End: 2024-08-17
  Administered 2024-08-16: 15:00:00 100 mg via ORAL

## 2024-08-16 MED ORDER — FUROSEMIDE 40 MG PO TABS
40 | ORAL | Status: AC
Start: 2024-08-16 — End: 2024-08-16

## 2024-08-16 MED ORDER — CINACALCET HCL 30 MG PO TABS
30 | Freq: Every evening | ORAL | Status: DC
Start: 2024-08-16 — End: 2024-08-17
  Administered 2024-08-16: 07:00:00 30 mg via ORAL

## 2024-08-16 MED ORDER — INSULIN GLARGINE 100 UNIT/ML SC SOLN
100 | Freq: Every evening | SUBCUTANEOUS | Status: DC
Start: 2024-08-16 — End: 2024-08-17
  Administered 2024-08-17: 01:00:00 20 [IU] via SUBCUTANEOUS

## 2024-08-16 MED ORDER — DEXTROSE 10 % IV SOLN
10 | INTRAVENOUS | Status: DC | PRN
Start: 2024-08-16 — End: 2024-08-17

## 2024-08-16 MED ORDER — MODAFINIL 100 MG PO TABS
100 | Freq: Every day | ORAL | Status: DC
Start: 2024-08-16 — End: 2024-08-17
  Administered 2024-08-16: 15:00:00 100 mg via ORAL

## 2024-08-16 MED ORDER — ASPIRIN 325 MG PO TABS
325 | ORAL | Status: AC
Start: 2024-08-16 — End: 2024-08-16
  Administered 2024-08-16: 07:00:00 325 mg via ORAL

## 2024-08-16 MED ORDER — INSULIN GLARGINE 100 UNIT/ML SC SOLN
100 | Freq: Every day | SUBCUTANEOUS | Status: DC
Start: 2024-08-16 — End: 2024-08-16

## 2024-08-16 MED ORDER — MAGNESIUM OXIDE 250 MG PO TABS
250 | Freq: Every day | ORAL | Status: DC
Start: 2024-08-16 — End: 2024-08-16

## 2024-08-16 MED ORDER — POTASSIUM CHLORIDE CRYS ER 10 MEQ PO TBCR
10 | Freq: Every day | ORAL | Status: DC
Start: 2024-08-16 — End: 2024-08-17
  Administered 2024-08-16: 15:00:00 10 meq via ORAL

## 2024-08-16 MED ORDER — INSULIN LISPRO 100 UNIT/ML IJ SOLN
100 | Freq: Four times a day (QID) | INTRAMUSCULAR | Status: DC
Start: 2024-08-16 — End: 2024-08-17
  Administered 2024-08-16: 8 [IU] via SUBCUTANEOUS

## 2024-08-16 MED FILL — BACLOFEN 10 MG PO TABS: 10 mg | ORAL | Qty: 2

## 2024-08-16 MED FILL — CARVEDILOL 6.25 MG PO TABS: 6.25 mg | ORAL | Qty: 1

## 2024-08-16 MED FILL — HUMALOG 100 UNIT/ML IJ SOLN: 100 [IU]/mL | INTRAMUSCULAR | Qty: 8

## 2024-08-16 MED FILL — MODAFINIL 100 MG PO TABS: 100 mg | ORAL | Qty: 1

## 2024-08-16 MED FILL — MAGNESIUM OXIDE 250 MG PO TABS: 250 mg | ORAL | Qty: 2

## 2024-08-16 MED FILL — TOPIRAMATE 100 MG PO TABS: 100 mg | ORAL | Qty: 1

## 2024-08-16 MED FILL — POTASSIUM CHLORIDE CRYS ER 10 MEQ PO TBCR: 10 meq | ORAL | Qty: 1

## 2024-08-16 MED FILL — DEXTROSE 10 % IV SOLN: 10 % | INTRAVENOUS | Qty: 250

## 2024-08-16 MED FILL — MAGNESIUM OXIDE -MG SUPPLEMENT 400 (240 MG) MG PO TABS: 400 (240 Mg) MG | ORAL | Qty: 1

## 2024-08-16 MED FILL — DONEPEZIL HCL 5 MG PO TABS: 5 mg | ORAL | Qty: 1

## 2024-08-16 MED FILL — FUROSEMIDE 40 MG PO TABS: 40 mg | ORAL | Qty: 1

## 2024-08-16 MED FILL — CINACALCET HCL 30 MG PO TABS: 30 mg | ORAL | Qty: 1 | Fill #0

## 2024-08-16 MED FILL — ASPIRIN ADULT 325 MG PO TABS: 325 mg | ORAL | Qty: 1

## 2024-08-16 MED FILL — CINACALCET HCL 30 MG PO TABS: 30 mg | ORAL | Qty: 1

## 2024-08-16 NOTE — ACP (Advance Care Planning) (Signed)
 Advance Care Planning   Healthcare Decision Maker:    Primary Decision Maker: Nevena, Rozen[place] Child [phone]    Secondary Decision Maker: Burdette Pay [phone]    [facility]                  Room # ER16/16    Name: Concetta Guion           Age: 69 y.o.    Gender: female          MRN: [id number]  Religion: Baptist       Preferred Language: English    Date: 08/16/24  Visit Time:            Visit Summary: Patient was in bed and awake.There was a daughter  at bedside with patient. Chaplain introduced self. . Patient and family shared about patient 's generalized weakness and constant  decline. In strength and health.  . Patient expressed comfort in faith and worship community. 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   Referral/Consult From: Rounding  Encounter Overview/Reason: Initial Encounter, Crisis  Encounter Code: Encounter Code: Q9001 Assessment by chaplain services   Crisis (if applicable):     Service Provided For: Patient     Patient was available.      Faith, Belief, Meaning:   Patient is connected with a faith tradition or spiritual practice  Family/Friends identifies as spiritual  Family/friends present:    Rituals (if applicable)      Importance and Influence:  Patient has spiritual/personal beliefs that influence decisions regarding their health  Family/Friends has spiritual/personal beliefs that influence decisions regarding the patient's health    Community:  Patient   indicated that they feel well-supported  Family/Friends   indicated that they feel well-supported    Assessment and Plan of Care:   Emotions Expressed by Patient:        Interventions by Chaplain:         Result/ Response by Patient:        Patient Plan of Care:         Emotions Expressed by Spouse/Family/Friends:   calm  peaceful  thankful    Chaplain Interventions with Spouse/ Family/Friends include:   active listening    Spouse/Family/Friends Plan of Care:   Spiritual care  available upon referral.        Electronically signed by Jackee JINNY Ysidro Mickey., [facility] on 08/16/2024 at 1:31 PM

## 2024-08-16 NOTE — Care Coordination (Signed)
 Social informed ER diplomatic services operational officer that patient will need medical transport home to [address] [place] [address].     Social worker verified address with patient. Per patients daughter Buel, she will be there to receive patient.    RN will need to call patient daughter to make sure someone is home to receive patient.  RN to call Violanda, Bobeck (Child)  [phone] (Home Phone)       Adonna Mace MSW  Social Worker Case Management  [facility]  [facility]  [address]  [place], [place] [address]  [phone]

## 2024-08-16 NOTE — Care Coordination (Signed)
 Social worker followed up with patient, patient working with PT. Social worker will follow-up with patient once she complete therapy.      Adonna Mace MSW  Social Worker Case Management  [facility]  [facility]  [address]  [place], [place] [address]  [phone]

## 2024-08-16 NOTE — Care Coordination (Signed)
 Social worker met with patient at bedside, while patients daughter  Gerica, Koble was on speaker phone.     Social worker spoke with patient and her daughter to get a full understanding of what needs patients prior to discharge.     Per patient and patients daughter patient are agreeable to Home Health for PT/OT skilled nursing and medication management.    Per patient and patients daughter patient need medical transport.    Social worker placed Home Health Services referrals in careport and patient was informed that she will need to be accepted and social worker ill call her for choice is discharged.    Dr. Willie was notified and agreeable with patient dispo plan. Social worker requested Home Health orders for patient.      No other social worker needs identified at this time. Case management remains available as needed.      Adonna Mace MSW  Social Worker Case Management  [facility]  [facility]  [address]  [place], [place] [address]  [phone]

## 2024-08-16 NOTE — ED Notes (Signed)
 Assumed care of pt at this time.

## 2024-08-16 NOTE — ED Notes (Signed)
 Report given to [employer].

## 2024-08-16 NOTE — Plan of Care (Signed)
 Problem: Physical Therapy - Adult  Goal: By Discharge: Performs mobility at highest level of function for planned discharge setting.  See evaluation for individualized goals.  Description: Physical Therapy Goals  Initiated 08/16/2024 and to be accomplished within 7 day(s)  1.  Patient will move from supine to sit and sit to supine  in bed with minimal assistance in preparation for seated tasks.    2.  Patient will transfer from bed to chair and chair to bed with moderate assistance using the least restrictive device in preparation for out of bed.  3.  Patient will perform sit to stand with moderate assistance in preparation for out of bed tasks.  4.  Patient will ambulate with moderate assistance for 3 feet with the least restrictive device in preparation for home setting.     PLOF: Lives with daughter. One story home with 3-4 steps to enter. Caregiver 8 hours/day Monday-Friday; daughter assist onn weekends. Transfers from couch to bedside commode with assist from daughter/aide. Has manual wheelchair. Has ww and rollator; used in past. Increased weakness over past week and a half per daughter. Attending outpatient PT.   Outcome: Progressing   Recommendations for interdisciplinary mobility: Chair position in bed. Assist to EOB for all meals.    PHYSICAL THERAPY EVALUATION    Patient: [name] Short (69 y.o. female)  Date: 08/16/2024  Primary Diagnosis: No admission diagnoses are documented for this encounter.  Precautions: Contact Precautions  History & Present Condition:  Presented to ED 08/15/2024 with weakness and 3-4 day upper respiratory infection symptoms.   ASSESSMENT :  Evaluated in ED 16. Patient on stretcher with daughter present. Oriented to self and place. Able to wiggle toes and perform knee ROM on stretcher. Decreased strength and range noted on RLE. Slow movements with functional mobility. Max A for supine to sit. Seated at edge of stretcher with supervision for balance. Demonstrates full knee extension  bilaterally with AROM assessment in seated. Strength grossly 3/5 BLE. Attempted sit to stand with max A; unable to extend through hips/knees d/t poor anterior weight shift, stretcher positioning, and weakness. Max A for sit to supine. Max A for scooting up on stretcher. Patient with person gait belt on; daughter and patient request to leave on. Educated on skin breakdown and proper wear use of gait belt. Educated on need for RN assistance with mobility; verbalized understanding. Call bell in reach.     DEFICITS/IMPAIRMENTS:   Body Structures, Functions, Activity Limitations Requiring Skilled Therapeutic Intervention: Decreased functional mobility ;Decreased safe awareness;Decreased endurance;Decreased balance;Decreased strength;Decreased ROM    Patient will benefit from skilled intervention to address the above impairments.  Patient's rehabilitation potential: Therapy Prognosis: Fair.  Factors which may influence rehabilitation potential include:  []          None noted  []          Mental ability/status  [x]          Medical condition  []          Home/family situation and support systems  []          Safety awareness  []          Pain tolerance/management  []          Other:      PLAN :  Recommendations and Planned Interventions:   [x]            Bed Mobility Training             [x]     Neuromuscular Re-Education  [x]   Transfer Training                   []     Orthotic/Prosthetic Training  [x]            Gait Training                          []     Modalities  [x]            Therapeutic Exercises           []     Edema Management/Control  [x]            Therapeutic Activities            [x]     Family Training/Education  [x]            Patient Education  []            Other (comment):    Frequency/Duration: Patient will be followed by physical therapy 1-2 times per day/3-5 days per week to address goals.    Further Equipment Recommendations for Discharge: Needs: hospital bed, mechanical lift / Has: bedside commode,  rollator, and rolling walker    AM-PAC Inpatient Mobility Raw Score : 10     This AMPAC score should be considered in conjunction with interdisciplinary team recommendations to determine the most appropriate discharge setting. Patient's social support, diagnosis, medical stability, and prior level of function should also be taken into consideration.    Current research shows that an AM-PAC score of 17 (13 without stairs) or less is not associated with a discharge to the patient's home setting.     SUBJECTIVE:   Patient stated My knees hurt.    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/2021    left sided weakness     Past Surgical History:   Procedure Laterality Date    BACK SURGERY N/A 06/24/2022    INCISION AND DRAINAGE /DEBRIDEMENT SACRAL DECUBITUS performed by Reda Garre, DO at [facility] MAIN OR    CESAREAN SECTION  1987    COLONOSCOPY N/A 01/13/2017    COLONOSCOPY performed by Debby Dun, MD at [facility] ENDOSCOPY    COLONOSCOPY      COLONOSCOPY N/A 04/08/2023    COLONOSCOPY DIAGNOSTIC performed by Gerlean Norleen Anes, MD at [facility] ENDOSCOPY    DILATION AND CURETTAGE OF UTERUS  1995    RECTAL SURGERY N/A 12/28/2021    SACRAL WOUND DEBRIDEMENT INCISION AND DRAINAGE performed by Garre Reda, DO at [facility] MAIN OR    SPINE SURGERY N/A 12/09/2021    CERVICAL THREE/FOUR/FIVE/SIX LAMINECTOMY FUSION; C-ARM; STRYKER; EXT BONE GROWTH STIM; 23 HR performed by Anes KATHEE March, MD at [facility] MAIN OR     Home Situation:  Social/Functional History  Lives With: Daughter  Type of Home: House  Home  Layout: One level  Home Access: Stairs to enter with rails  Entrance  Stairs - Number of Steps: 3-4  Bathroom Toilet: Bedside commode  Home Equipment: Cabin Crew, Wheelchair - Manual    Critical Behavior:  Orientation  Orientation Level: Oriented to person;Oriented to place     Strength (BLE):    Strength: Generally decreased, functional    Range Of Motion (BLE):  AROM: Generally decreased, functional     Functional Mobility:  Bed Mobility:  Bed Mobility Training  Bed Mobility Training: Yes  Supine to Sit: Substantial/Maximal assistance  Sit to Supine: Substantial/Maximal assistance  Scooting: Substantial/Maximal assistance  Balance:   Balance  Sitting: Impaired  Sitting - Static: Fair (occasional)  Sitting - Dynamic: Fair (occasional)    Pain:  Intensity Pre-treatment: 10/10   Intensity Post-treatment: 10/10  Scale: Numeric Rating Scale  Location: bilateral knee (chronic)  Quality: Aching  Intervention(s): Nurse Notified and Repositioning    Activity Tolerance:   Activity Tolerance: Patient tolerated evaluation without incident    After treatment:   []          Patient left in no apparent distress sitting up in chair  [x]          Patient left in no apparent distress   [x]          Call bell left within reach  [x]          Nursing notified  []          Caregiver present  []          Bed alarm activated  []          Chair alarm activated  []          No alarm   []          SCDs applied    COMMUNICATION/EDUCATION:   Patient Education  Education Given To: Patient  Education Provided: Role of Therapy;Plan of Care  Education Method: Demonstration;Verbal;Teach Back  Barriers to Learning: None  Education Outcome: Verbalized understanding;Demonstrated understanding;Continued education needed    Thank you for this referral.  Edsel Loll, PT, DPT  Minutes: 20    Eval Complexity: Decision Making: Medium Complexity

## 2024-08-16 NOTE — Plan of Care (Signed)
 Problem: Occupational Therapy - Adult  Goal: By Discharge: Performs self-care activities at highest level of function for planned discharge setting.  See evaluation for individualized goals.  Description: Occupational Therapy Goals:  Initiated 08/16/2024 to be met within 7-10 days.    1.  Patient will perform bathing with minimal assistance.   2.  Patient will perform upper body dressing with minimal assistance.  3.  Patient will perform lower body dressing  with moderate assistance.  4.  Patient will perform toilet transfers with moderate assistance.  5.  Patient will perform all aspects of toileting with moderate assistance.  6.  Patient will participate in upper extremity therapeutic exercise/activities with minimal assistance for 8-10 minutes to increase strength/endurance for ADLs.    7.  Patient will utilize energy conservation techniques during functional activities with verbal cues.    PLOF: Lives with daughter. One story home with 3-4 steps to enter. Caregiver 8 hours/day Monday-Friday; daughter assist onn weekends. Transfers from couch to bedside commode with assist from daughter/aide. Has manual wheelchair. Has ww and rollator; used in past. Increased weakness over past week and a half per daughter. Attending outpatient PT.       Outcome: Progressing   Recommendations for interdisciplinary care: bed in chair mode     OCCUPATIONAL THERAPY EVALUATION    Patient: [name] Short (69 y.o. female)  Date: 08/16/2024  Primary Diagnosis: No admission diagnoses are documented for this encounter.       Precautions: Contact Precautions,  ,  ,  ,  ,  ,  ,      ASSESSMENT :  Pt seen for OT evaluation. Pt soiled upon OT arrival, copious amounts of stool noted. RN made aware, appreciate her assistance with cleaning pt and changing sheets. At this time, she is requiring max assist to roll L<>R and max-total assist ADLs. She is below her functional baseline and may benefit from skilled acute OT services during hospital  stay.    Recommend for next OT session:  ADLs at EOB    DEFICITS/IMPAIRMENTS:  Performance deficits / Impairments: Decreased endurance;Decreased functional mobility ;Decreased ADL status;Decreased balance;Decreased strength;Decreased safe awareness;Decreased cognition;Decreased high-level IADLs    Patient will benefit from skilled intervention to address the above impairments.  Patient's rehabilitation potential/Prognosis: Fair.  Factors which may influence rehabilitation potential include:   []              None noted  []              Mental ability/status  [x]              Medical condition  [x]              Home/family situation and support systems  []              Safety awareness  []              Pain tolerance/management  []              Other:      PLAN :  Recommendations and Planned Interventions:   [x]                Self Care Training                  [x]       Therapeutic Activities  [x]                Functional Mobility Training   []       Cognitive Retraining  [x]   Therapeutic Exercises           [x]       Endurance Activities  [x]                Balance Training                    [x]       Neuromuscular Re-Education  []                Visual/Perceptual Training     [x]       Home Safety Training  [x]                Patient Education                   [x]       Family Training/Education  []                Other (comment):    Frequency/Duration: Patient will be followed by occupational therapy to address goals, 1-2 times per day/3-5 days per week to address goals.    Further Equipment Recommendations for Discharge: bedside commode and tub transfer bench    AMPAC: AM-PAC Inpatient Daily Activity Raw Score: 12    Current research shows that an AM-PAC score of 17 or less is not associated with a discharge to the patient's home setting.    This AMPAC score should be considered in conjunction with interdisciplinary team recommendations to determine the most appropriate discharge setting. Patient's social support,  diagnosis, medical stability, and prior level of function should also be taken into consideration.     SUBJECTIVE:   Patient stated, I thought I maybe went to the bathroom.    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/2021    left sided weakness     Past Surgical History:   Procedure Laterality Date    BACK SURGERY N/A 06/24/2022    INCISION AND DRAINAGE /DEBRIDEMENT SACRAL DECUBITUS performed by Reda Garre, DO at [facility] MAIN OR    CESAREAN SECTION  1987    COLONOSCOPY N/A 01/13/2017    COLONOSCOPY performed by Debby Dun, MD at [facility] ENDOSCOPY    COLONOSCOPY      COLONOSCOPY N/A 04/08/2023    COLONOSCOPY DIAGNOSTIC performed by Gerlean Norleen Anes, MD at [facility] ENDOSCOPY    DILATION AND CURETTAGE OF UTERUS  1995    RECTAL SURGERY N/A 12/28/2021    SACRAL WOUND DEBRIDEMENT INCISION AND DRAINAGE performed by Garre Reda, DO at [facility] MAIN OR    SPINE SURGERY N/A 12/09/2021    CERVICAL THREE/FOUR/FIVE/SIX LAMINECTOMY FUSION; C-ARM; STRYKER; EXT BONE GROWTH STIM; 23 HR performed by Anes KATHEE March, MD at [facility] MAIN OR       Home Situation:   Social/Functional History  Lives With: Daughter  Type of Home: House  Home  Layout: One level  Home Access: Stairs to enter with rails  Entrance Stairs - Number of Steps: 3-4  Bathroom Toilet: Bedside commode  Home Equipment: Cabin Crew, Wheelchair - Manual  []   Right hand dominant   []   Left hand dominant    Cognitive/Behavioral Status:  Orientation  Orientation Level: Oriented to person;Oriented to place     Skin: visible skin intact  Edema: none noted     Coordination: BUE  Coordination:  Generally decreased, functional    Balance:  Balance  Sitting: Impaired  Sitting - Static: Fair (occasional)  Sitting - Dynamic: Fair (occasional)    Strength: BUE  Strength: Generally decreased, functional    Range of Motion: BUE  AROM: Generally decreased, functional  PROM: Generally decreased, functional    Functional Mobility and Transfers for ADLs:  Bed Mobility:  Bed Mobility Training  Bed Mobility Training: Yes  Rolling: Substantial/Maximal assistance  Supine to Sit: Substantial/Maximal assistance  Sit to Supine: Substantial/Maximal assistance  Scooting: Substantial/Maximal assistance  Transfers:    ADL Assessment:   Feeding: Setup  Grooming: Setup  UE Bathing: Maximum assistance  LE Bathing: Maximum assistance  UE Dressing: Maximum assistance  LE Dressing: Maximum assistance  Toileting: Dependent/Total    Pain:  Intensity Pre-treatment: 0/10   Intensity Post-treatment: 0/10  Scale: Numeric Rating Scale    Activity Tolerance:   Activity Tolerance: Patient limited by fatigue  Please refer to the flowsheet for vital signs taken during this treatment.    After treatment:   []  Patient left in no apparent distress sitting up in chair  [x]  Patient left in no apparent distress in bed  [x]  Call bell left within reach  [x]  Nursing notified  []  Caregiver present  []  Bed/chair alarm activated  [x]  No alarm-ED stretcher, no alarm available     COMMUNICATION/EDUCATION:   Patient Education  Education Given To: Patient  Education Provided: Plan of Care;Role of Therapy;Mobility Training  Education Method: Teach Back  Barriers to Learning: Cognition  Education Outcome: Verbalized understanding;Continued education needed    Thank you for this referral.  Waddell Halim, OT  Minutes: 38    Eval Complexity: Decision Making: Medium Complexity

## 2024-08-17 LAB — CULTURE, URINE: Culture: NO GROWTH

## 2024-08-17 LAB — POCT GLUCOSE: POC Glucose: 427 mg/dL (ref 70–110)

## 2024-08-17 MED ORDER — INSULIN REGULAR HUMAN 100 UNIT/ML IJ SOLN
100 | INTRAMUSCULAR | Status: AC
Start: 2024-08-17 — End: 2024-08-16
  Administered 2024-08-17: 01:00:00 3 via INTRAVENOUS

## 2024-08-17 MED ORDER — INSULIN REGULAR HUMAN 100 UNIT/ML IJ SOLN
100 | Freq: Once | INTRAMUSCULAR | Status: AC
Start: 2024-08-17 — End: 2024-08-16

## 2024-08-17 MED FILL — LANTUS 100 UNIT/ML SC SOLN: 100 [IU]/mL | SUBCUTANEOUS | Qty: 20

## 2024-08-17 MED FILL — MODAFINIL 100 MG PO TABS: 100 mg | ORAL | Qty: 1

## 2024-08-17 MED FILL — HUMULIN R 100 UNIT/ML IJ SOLN: 100 [IU]/mL | INTRAMUSCULAR | Qty: 1

## 2024-08-18 ENCOUNTER — Encounter: Payer: Medicare (Managed Care) | Primary: Physician Assistant

## 2024-08-19 LAB — VAS DUP LOWER EXTREMITY VENOUS RIGHT: Body Surface Area: 1.7 m2

## 2024-08-23 ENCOUNTER — Encounter: Payer: Medicare (Managed Care) | Primary: Physician Assistant

## 2024-08-24 ENCOUNTER — Other Ambulatory Visit (HOSPITAL_COMMUNITY): Payer: Self-pay

## 2024-08-25 ENCOUNTER — Other Ambulatory Visit (HOSPITAL_COMMUNITY): Payer: Self-pay

## 2024-08-25 ENCOUNTER — Encounter: Payer: Medicare (Managed Care) | Primary: Physician Assistant

## 2024-08-29 ENCOUNTER — Ambulatory Visit
Admit: 2024-08-29 | Discharge: 2024-08-29 | Payer: Medicare (Managed Care) | Attending: Specialist | Primary: Physician Assistant

## 2024-08-29 VITALS — Ht 59.0 in

## 2024-08-29 DIAGNOSIS — M1711 Unilateral primary osteoarthritis, right knee: Principal | ICD-10-CM

## 2024-08-29 MED ORDER — SODIUM HYALURONATE (VISCOSUP) 20 MG/2ML IX SOSY
20 | Freq: Once | INTRA_ARTICULAR | Status: AC
Start: 2024-08-29 — End: 2024-08-29
  Administered 2024-08-29: 19:00:00 20 mg via INTRA_ARTICULAR
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
## 2024-08-30 ENCOUNTER — Encounter: Payer: Medicare (Managed Care) | Primary: Physician Assistant

## 2024-08-31 ENCOUNTER — Other Ambulatory Visit (HOSPITAL_COMMUNITY): Payer: Self-pay

## 2024-08-31 ENCOUNTER — Other Ambulatory Visit (HOSPITAL_BASED_OUTPATIENT_CLINIC_OR_DEPARTMENT_OTHER): Payer: Self-pay

## 2024-09-01 ENCOUNTER — Inpatient Hospital Stay: Admit: 2024-09-01 | Payer: Medicare (Managed Care) | Primary: Physician Assistant

## 2024-09-01 NOTE — Progress Notes (Signed)
 PHYSICAL / OCCUPATIONAL THERAPY - DAILY TREATMENT NOTE    Patient Name: [name] Short    Date: 09/01/2024    DOB: 1955-09-12  Insurance: [employer]* (Medicare Managed) /     Patient DOB verified Yes     Visit #   Current / Total 1 16   Time   In / Out 1210 0112   Pain   In / Out 8/10 8/10   Subjective Functional Status/Changes: Pt reports that she went to the ER x3 times 2 weeks ago; they're recommending she get some more testing.      TREATMENT AREA =  Decreased strength  Loss of balance    OBJECTIVE    Therapeutic Procedures:    Tx Min Billable or 1:1 Min (if diff from Tx Min) Procedure, Rationale, Specifics   19  97530 Therapeutic Activity (timed):  use of dynamic activities replicating functional movements to increase ROM, strength, coordination, balance, and proprioception in order to improve patient's ability to progress to PLOF and address remaining functional goals.  (see flow sheet as applicable)     Details if applicable:  progress update     8  97535 Self Care/Home Management (timed):  improve patient knowledge and understanding of home injury/symptom/pain management, positioning, posture/ergonomics, home safety, activity modification, transfer techniques, and joint protection strategies  to improve patient's ability to progress to PLOF and address remaining functional goals.  (see flow sheet as applicable)     Details if applicable:  pt ed    27  MC BC Totals Reminder: bill using total billable min of TIMED therapeutic procedures (example: do not include dry needle or estim unattended, both untimed codes, in totals to left)  8-22 min = 1 unit; 23-37 min = 2 units; 38-52 min = 3 units; 53-67 min = 4 units; 68-82 min = 5 units   Total Total     Charge Capture    [x]   Patient Education billed concurrently with other procedures   [x]  Review HEP    []  Progressed/Changed HEP, detail:    []  Other detail:       Objective Information/Functional Measures/Assessment  ** Pt in bathroom for 35 min 2/2  incontinence upon arriving into clinic space, assisted by her caregiver**      Patient is a pleasant 69 year old female with c/o weakness and impaired balance. Patient has completed x29 visits of skilled PT to address, reporting 10% improvement since [facility]. Reports functional no current functional gains 2/2 recent x3 ER visits 2 weeks ago (without hospital admission) and poor HEP compliance in the past month. Reports functional deficits including: continued downward gaze with ambulation, slow gait speed, pain/difficulty with transfers, getting up/down from the toilet, getting in/out of the shower, dusting furniture, and cannot stand/cook for herself. Objectively, patient presents with slow, but steady improvements in BLE strength with seated MMT. Otherwise, pt remains indicated as severe fall risk via 5xSTS and TUG tests; slight improvement in 5xSTS score and regression in TUG score. Progress limited by x3 weeks off from PT, x3 ER visits recently, and poor HEP compliance in the meantime. Patient will continue to benefit from skilled PT to address the remaining deficits and improve functional mobility, so that she can perform ADLs/IADLs, ambulation, and transfers safely with minimal pain or limitation.       Patient will continue to benefit from skilled PT / OT services to modify and progress therapeutic interventions, analyze and address functional mobility deficits, analyze and address  ROM deficits, analyze and address strength deficits, analyze and address soft tissue restrictions, analyze and cue for proper movement patterns, analyze and modify for postural abnormalities, analyze and address imbalance/dizziness, and instruct in home and community integration to address functional deficits and attain remaining goals.    Progress toward goals / Updated goals:  []   See Progress Note/Recertification    Functional Gains: no functional gains to report -- especially with recent x3 ER visits and poor HEP compliance    Functional Deficits: looking down with ambulation, slow ambulation, transfers, getting up/down from the toilet, cannot stand/cook for herself, getting in/out the shower, dusting furniture    % improvement: 10%  Pain   Average: NT/10       Best: 4/10     Worst: 8/10 -- B knees   Patient Goal: To get back to where I was -- walk around the house, move about, cook for myself    Pt will be able to improve strength in Hip flexion, seated hip ER/Adduction; knee extension/flexion to at least 5/5 to improve patient's ability to get in/out of chair with efficiency at home & community. (Initial LTG)               RC: addressing with resisted hip and knee strengthening [Date assessed: 07/19/24]   Progression   Hip ER:  4/5 B                          Hip Adduction: 4+/5 B                          Knee Flexion: 4+/5 L; 4/5 R                          Knee Extension: 5/5 B    PROGRESSED: Hip flex: 4+/5 B, knee ext 4+/5 R, 5/5 L; knee flex 5/5 B; seated hip ER 4+/5 B, seated hip ADD 5/5 B [Date assessed: 09/01/2024]     2.  Pt will improve LEFS score to 31 to demonstrate improvement in patient's ability to perform unrestricted ADLs/IADLs at home & community. (Initial LTG)               RC: Progression : 11 points               Assess at 30 day mark [Date assessed: 08/01/2024]     REGRESSED: 10 points [Date assessed: 09/01/2024]     3. Pt will report compliance with home HEP at end of care to enhance carry over of fuctional gains made with skilled therapy services in order to improve quality of life. (Initial LTG)                RC: Progression: Performs HEP 1x per day.               Current: continued compliance [Date assessed: 08/10/24]    REGRESSED: poor HEP compliance since x3 ER visits in the past few weeks [Date assessed: 09/01/2024]     4. Pt will improve TUG score to at least 26 seconds with FWW to demonstrate a decrease in patients falls risk. (Initial LTG)               RC: Progression : 1 min 10 sec p!    REGRESSED: 1 min  37 sec -- with RW, slow, shuffled gait     5. Pt  will improve her 5 time sit to stand time to least 23 seconds with UE to demonstrate improvement in her functional LE strength & decrease her risk for falls at home.               RC: Progression : 42 sec with L  knee discomfort    PROGRESSED: 38 sec with BUE -- heavy BUE and not not reach full stand, keeping BUE on armrests [Date assessed: 09/01/2024]     Next PN 09/30/24  RC or Medicaid tracking due 10/29/24  Auth due (visit number/ date) 11 visits remaining by 10/28/24    PLAN  - Continue Plan of Care    Erla Diss, PT    09/01/2024    12:15 PM  If an interpreting service was utilized for treatment of this patient, the contents of this document represent the material reviewed with the patient via the interpreter.     Future Appointments   Date Time Provider Department Center   09/06/2024 12:10 PM Maryjane Pride, PT [facility]   09/08/2024  1:50 PM Evlyn Elspeth BROCKS, MD [facility]   09/08/2024  2:10 PM Maryjane Pride, PT [facility]   09/13/2024 12:10 PM Fabiola Laymon HACKNEY [facility]   09/15/2024 12:10 PM Reather Chew, [place] [facility]   09/15/2024  1:50 PM Evlyn Elspeth BROCKS, MD [facility]   09/20/2024 12:10 PM Fabiola Laymon HACKNEY [facility]   09/22/2024 12:10 PM Maryjane Pride, PT [facility]   09/27/2024 12:10 PM Maryjane Pride, PT [facility]   09/29/2024 12:10 PM Diss Erla, PT [facility]

## 2024-09-01 NOTE — Therapy Recertification (Signed)
 [facility] - [address]  [address]  [place], [place] [address]  [phone] fax    CONTINUED PLAN OF CARE/RECERTIFICATION FOR PHYSICAL THERAPY          Patient Name: [name] Short DOB: [birth year]   Treatment/Medical Diagnosis: Decreased strength [R53.1]  Loss of balance [R26.89]  Other abnormalities of gait and mobility [R26.89]   Onset Date: July 2025 - fall     Referral Source: Ike Olam BIRCH, PA Start of Care [facility]): 04/08/24   Prior Hospitalization: See Medical History     Prior Level of Function: Mod I with FWW, DTR/Aid with patient all day    Comorbidities:   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       Visits from [facility]: 29 Missed Visits: 4     Insurance Primary / Secondary: [employer]* (Medicare Managed) /     Progress to Goals  Pt will be able to improve strength in Hip flexion, seated hip ER/Adduction; knee extension/flexion to at least 5/5 to improve patient's ability to get in/out of chair with efficiency at home & community. (Initial LTG)               RC: addressing with resisted hip and knee strengthening [Date assessed: 07/19/24]   Progression   Hip ER:  4/5 B                          Hip Adduction: 4+/5 B                          Knee Flexion: 4+/5 L; 4/5 R                          Knee Extension: 5/5 B               PROGRESSED: Hip flex: 4+/5 B, knee ext 4+/5 R, 5/5 L; knee flex 5/5 B; seated hip ER 4+/5 B, seated hip ADD 5/5 B [Date assessed: 09/01/2024]      2.  Pt will improve LEFS score to 31 to demonstrate improvement in patient's ability to perform unrestricted ADLs/IADLs at home & community. (Initial LTG)               RC: Progression : 11 points               Assess at 30 day mark [Date assessed: 08/01/2024]                REGRESSED: 10 points [Date assessed: 09/01/2024]      3. Pt will report compliance with home HEP at end of care to enhance carry over of fuctional gains  made with skilled therapy services in order to improve quality of life. (Initial LTG)                RC: Progression: Performs HEP 1x per day.               Current: continued compliance [Date assessed: 08/10/24]               REGRESSED: poor HEP compliance since x3 ER visits in the past few weeks [Date assessed: 09/01/2024]      4. Pt will improve TUG score to at least 26 seconds with FWW to  demonstrate a decrease in patients falls risk. (Initial LTG)               RC: Progression : 1 min 10 sec p!               REGRESSED: 1 min 37 sec -- with RW, slow, shuffled gait      5. Pt will improve her 5 time sit to stand time to least 23 seconds with UE to demonstrate improvement in her functional LE strength & decrease her risk for falls at home.               RC: Progression : 42 sec with L  knee discomfort               PROGRESSED: 38 sec with BUE -- heavy BUE and not not reach full stand, keeping BUE on armrests [Date assessed: 09/01/2024]     Key Functional Changes/Progress:   Functional Gains: no functional gains to report -- especially with recent x3 ER visits and poor HEP compliance   Functional Deficits: looking down with ambulation, slow ambulation, transfers, getting up/down from the toilet, cannot stand/cook for herself, getting in/out the shower, dusting furniture    % improvement: 10%  Pain   Average: NT/10                  Best: 4/10                Worst: 8/10 -- B knees   Patient Goal: To get back to where I was -- walk around the house, move about, cook for myself    Problem List: pain affecting function, decreased ROM, decreased strength, edema affecting function, impaired gait/balance, decreased ADL/functional abilities, decreased activity tolerance, decreased flexibility/joint mobility, and decreased transfer abilities    Treatment Plan may include any combination of the following: 02889 Therapeutic Exercise, 97112 Neuromuscular Re-Education, 97140 Manual Therapy, 97530 Therapeutic Activity, 97535 Self  Care/Home Management, 97014 Electrical Stim unattended / [phone] [facility]), [phone] Electrical Stim attended, and [phone] Gait Training    Primary mitigating factor which impeded progress:  Poor follow through due to factors not listed above    Patient Goal(s) has been updated and includes:     Goals for this certification period include and are to be achieved in   8  weeks:    Pt will be able to improve strength in Hip flexion, seated hip ER/Adduction; knee extension/flexion to at least 5/5 to improve patient's ability to get in/out of chair with efficiency at home & community. (Initial LTG)                RC: Hip flex: 4+/5 B, knee ext 4+/5 R, 5/5 L; knee flex 5/5 B; seated hip ER 4+/5 B, seated hip ADD 5/5 B [Date assessed: 09/01/2024]      2.  Pt will improve LEFS score to 31 to demonstrate improvement in patient's ability to perform unrestricted ADLs/IADLs at home & community. (Initial LTG)                RC: 10 points [Date assessed: 09/01/2024]      3. Pt will report compliance with home HEP at end of care to enhance carry over of fuctional gains made with skilled therapy services in order to improve quality of life. (Initial LTG) ]               RC: poor HEP compliance since x3 ER visits in the past few  weeks [Date assessed: 09/01/2024]      4. Pt will improve TUG score to at least 26 seconds with FWW to demonstrate a decrease in patients falls risk. (Initial LTG)               RC: 1 min 37 sec -- with RW, slow, shuffled gait      5. Pt will improve her 5 time sit to stand time to least 23 seconds with UE to demonstrate improvement in her functional LE strength & decrease her risk for falls at home.               RC: 38 sec with BUE -- heavy BUE and not not reach full stand, keeping BUE on armrests [Date assessed: 09/01/2024]     Frequency / Duration:   Patient to be seen   1-2   times per week for   8    weeks:  Frequency / Duration: Patient would benefit from skilled PT 1-2 times per week for 16 VISITS (2 times per week  for 8 weeks/ 60 day cert) sessions as needed in this certification period.  Goals will be assigned and reassessed every 10 visits/ 30 days per Medicare guidelines    Assessments/Recommendations: Patient is a pleasant 69 year old female with c/o weakness and impaired balance. Patient has completed x29 visits of skilled PT to address, reporting 10% improvement since [facility]. Reports functional no current functional gains 2/2 recent x3 ER visits 2 weeks ago (without hospital admission) and poor HEP compliance in the past month. Reports functional deficits including: continued downward gaze with ambulation, slow gait speed, pain/difficulty with transfers, getting up/down from the toilet, getting in/out of the shower, dusting furniture, and cannot stand/cook for herself. Objectively, patient presents with slow, but steady improvements in BLE strength with seated MMT. Otherwise, pt remains indicated as severe fall risk via 5xSTS and TUG tests; slight improvement in 5xSTS score and regression in TUG score. Progress limited by x3 weeks off from PT, x3 ER visits recently, and poor HEP compliance in the meantime. Patient will continue to benefit from skilled PT to address the remaining deficits and improve functional mobility, so that she can perform ADLs/IADLs, ambulation, and transfers safely with minimal pain or limitation.     If you have any questions/comments please contact us  directly at [phone].   Thank you for allowing us  to assist in the care of your patient.    Certification Period: 09/01/24 - 10/29/24  Reporting Period (date from last assessment to current assessment): 07/25/24 - 09/01/24   Next PN/RC: 09/30/24    Erla Diss, PT       09/01/2024       1:50 PM      ___ I have read the above report and request that my patient continue as recommended.   ___ I have read the above report and request that my patient continue therapy with the following changes/special instructions:  ________________________________________________   ___ I have read the above report and request that my patient be discharged from therapy.     Physician's Signature:_________________________   DATE:_________   TIME:________                           Ike Olam BIRCH, PA    ** Signature, Date and Time must be completed for valid certification **  Please sign and return to [facility] or you may fax the signed copy to [phone]  Thank you.

## 2024-09-06 ENCOUNTER — Other Ambulatory Visit (HOSPITAL_COMMUNITY): Payer: Self-pay

## 2024-09-06 ENCOUNTER — Other Ambulatory Visit: Payer: Self-pay

## 2024-09-06 ENCOUNTER — Other Ambulatory Visit: Payer: Self-pay | Admitting: Student

## 2024-09-06 ENCOUNTER — Inpatient Hospital Stay: Admit: 2024-09-06 | Payer: Medicare (Managed Care) | Primary: Physician Assistant

## 2024-09-06 DIAGNOSIS — I5042 Chronic combined systolic (congestive) and diastolic (congestive) heart failure: Secondary | ICD-10-CM

## 2024-09-06 MED ORDER — SPIRONOLACTONE 25 MG PO TABS
25.0000 mg | ORAL_TABLET | Freq: Every day | ORAL | 0 refills | Status: AC
  Filled 2024-09-06: qty 30, 30d supply, fill #0

## 2024-09-06 MED ORDER — POTASSIUM CHLORIDE CRYS ER 20 MEQ PO TBCR
20.0000 meq | EXTENDED_RELEASE_TABLET | Freq: Every day | ORAL | 0 refills | Status: AC
  Filled 2024-09-06: qty 30, 30d supply, fill #0

## 2024-09-06 NOTE — Progress Notes (Signed)
 PHYSICAL / OCCUPATIONAL THERAPY - DAILY TREATMENT NOTE    Patient Name: [name] Short    Date: 09/06/2024    DOB: 08-20-1955  Insurance: [employer]* (Medicare Managed) /     Patient DOB verified Yes     Visit #   Current / Total 2 16   Time   In / Out 1232 1255   Pain   In / Out 8 8   Subjective Functional Status/Changes: I always have pain.     TREATMENT AREA =  Decreased strength  Loss of balance    OBJECTIVE         Therapeutic Procedures:    Tx Min Billable or 1:1 Min (if diff from Tx Min) Procedure, Rationale, Specifics   10 10 97110 Therapeutic Exercise (timed):  increase ROM, strength, coordination, balance, and proprioception to improve patient's ability to progress to PLOF and address remaining functional goals. (see flow sheet as applicable)     Details if applicable:       13 13 97530 Therapeutic Activity (timed):  use of dynamic activities replicating functional movements to increase ROM, strength, coordination, balance, and proprioception in order to improve patient's ability to progress to PLOF and address remaining functional goals.  (see flow sheet as applicable)     Details if applicable:     23 23 MC BC Totals Reminder: bill using total billable min of TIMED therapeutic procedures (example: do not include dry needle or estim unattended, both untimed codes, in totals to left)  8-22 min = 1 unit; 23-37 min = 2 units; 38-52 min = 3 units; 53-67 min = 4 units; 68-82 min = 5 units   Total Total     Charge Capture    [x]   Patient Education billed concurrently with other procedures   [x]  Review HEP    []  Progressed/Changed HEP, detail:    []  Other detail:       Objective Information/Functional Measures/Assessment    Pt arrived 22 minutes late to treatment so held on most exercises due to time constraints. She continues to have low tolerance to standing activities. Poor sit to stand quality and continues to have safety concerns with performance. Also required CGA to min A as she fatigued.  Poor thoracic mobility and overall postural awareness throughout treatment. Continue to progress as able.     Patient will continue to benefit from skilled PT / OT services to modify and progress therapeutic interventions, analyze and address functional mobility deficits, analyze and address ROM deficits, analyze and address strength deficits, analyze and address soft tissue restrictions, analyze and cue for proper movement patterns, analyze and modify for postural abnormalities, analyze and address imbalance/dizziness, and instruct in home and community integration to address functional deficits and attain remaining goals.    Progress toward goals / Updated goals:  []   See Progress Note/Recertification    Pt will be able to improve strength in Hip flexion, seated hip ER/Adduction; knee extension/flexion to at least 5/5 to improve patient's ability to get in/out of chair with efficiency at home & community. (Initial LTG)                RC: Hip flex: 4+/5 B, knee ext 4+/5 R, 5/5 L; knee flex 5/5 B; seated hip ER 4+/5 B, seated hip ADD 5/5 B [Date assessed: 09/01/2024]      2.  Pt will improve LEFS score to 31 to demonstrate improvement in patient's ability to perform unrestricted ADLs/IADLs at home &  community. (Initial LTG)                RC: 10 points [Date assessed: 09/01/2024]     Assess at 30 day mark [Date assessed: 09/06/2024]    3. Pt will report compliance with home HEP at end of care to enhance carry over of fuctional gains made with skilled therapy services in order to improve quality of life. (Initial LTG) ]               RC: poor HEP compliance since x3 ER visits in the past few weeks [Date assessed: 09/01/2024]      4. Pt will improve TUG score to at least 26 seconds with FWW to demonstrate a decrease in patients falls risk. (Initial LTG)               RC: 1 min 37 sec -- with RW, slow, shuffled gait      5. Pt will improve her 5 time sit to stand time to least 23 seconds with UE to demonstrate improvement  in her functional LE strength & decrease her risk for falls at home.               RC: 38 sec with BUE -- heavy BUE and not not reach full stand, keeping BUE on armrests [Date assessed: 09/01/2024]        Next PN 09/30/2024  RC or Medicaid tracking due 10/29/2024  Auth due (visit number/ date) 10 visits remaining by 10/28/2024    PLAN  - Continue Plan of Care    Dorise Ni, PTA, CSCS    09/06/2024    9:23 AM  If an interpreting service was utilized for treatment of this patient, the contents of this document represent the material reviewed with the patient via the interpreter.     Future Appointments   Date Time Provider Department Center   09/06/2024 12:10 PM Ni Dorise, PT [facility]   09/08/2024  1:50 PM Evlyn Elspeth BROCKS, MD [facility]   09/08/2024  2:10 PM Ni Dorise, PT [facility]   09/13/2024 12:10 PM Fabiola Laymon HACKNEY [facility]   09/15/2024 12:10 PM Reather Chew, [place] [facility]   09/15/2024  1:50 PM Evlyn Elspeth BROCKS, MD [facility]   09/20/2024 12:10 PM Fabiola Laymon HACKNEY [facility]   09/22/2024 12:10 PM Ni Dorise, PT [facility]   09/27/2024 12:10 PM Ni Dorise, PT [facility]   09/29/2024 12:10 PM Rush Ivy, PT [facility]

## 2024-09-07 ENCOUNTER — Other Ambulatory Visit: Payer: Self-pay

## 2024-09-08 ENCOUNTER — Ambulatory Visit
Admit: 2024-09-08 | Discharge: 2024-09-08 | Payer: Medicare (Managed Care) | Attending: Specialist | Primary: Physician Assistant

## 2024-09-08 ENCOUNTER — Inpatient Hospital Stay: Admit: 2024-09-08 | Payer: Medicare (Managed Care) | Primary: Physician Assistant

## 2024-09-08 DIAGNOSIS — M1711 Unilateral primary osteoarthritis, right knee: Principal | ICD-10-CM

## 2024-09-08 MED ORDER — SODIUM HYALURONATE (VISCOSUP) 20 MG/2ML IX SOSY
20 | Freq: Once | INTRA_ARTICULAR | Status: AC
Start: 2024-09-08 — End: 2024-09-08
  Administered 2024-09-08: 19:00:00 20 mg via INTRA_ARTICULAR

## 2024-09-08 NOTE — Progress Notes (Signed)
 Patient: [name] Short                MRN: [id number]       SSN: [id number]  Date of Birth: Jan 05, 1956        AGE: 69 y.o.        SEX: female  There is no height or weight on file to calculate BMI.    PCP: Ike Olam BIRCH, PA  09/08/24    Chief Complaint   Patient presents with    Knee Pain     bilateral     HISTORY:  [name] is a 69 y.o. female who is seen for bilateral  knee pain. She presents today for her second injection in the Euflexxa visco supplementation series.      ICD-10-CM    1. Unilateral primary osteoarthritis, right knee  M17.11 DRAIN/INJECT LARGE JOINT/BURSA     sodium hyaluronate (EUFLEXXA, HYALGAN) injection 20 mg      2. Unilateral primary osteoarthritis, left knee  M17.12 DRAIN/INJECT LARGE JOINT/BURSA     sodium hyaluronate (EUFLEXXA, HYALGAN) injection 20 mg         PROCEDURE:  Ms. Gaetz knees injected with 2 cc of Euflexxa.     Chart reviewed for the following:   I, Elspeth JAYSON Loge, MD, have reviewed the History, Physical and updated the Allergic reactions for [facility]     TIME OUT performed immediately prior to start of procedure:  I, Elspeth JAYSON Loge, MD, have performed the following reviews on [place] prior to the start of the procedure:            * Patient was identified by name and date of birth   * Agreement on procedure being performed was verified  * Risks and Benefits explained to the patient  * Procedure site verified and marked as necessary  * Patient was positioned for comfort  * Consent was obtained     Time: 2:26 PM     Date of procedure: 09/08/2024    Procedure performed by:  Elspeth JAYSON Loge, MD    Ms. Pilley tolerated the procedure well with no complications.    PLAN: Ms. Lindh knees injected with 2 cc of Euflexxa. Ms. Kintz will follow up in one week to complete her visco supplementation injection series.    Documentation by Rosina Hails, scribe, as documented by Elspeth KYM Loge, MD.

## 2024-09-08 NOTE — Progress Notes (Signed)
 PHYSICAL / OCCUPATIONAL THERAPY - DAILY TREATMENT NOTE    Patient Name: [name] Short    Date: 09/08/2024    DOB: September 18, 1955  Insurance: [employer]* (Medicare Managed) /     Patient DOB verified Yes     Visit #   Current / Total 3 16   Time   In / Out 228 310   Pain   In / Out 8 8   Subjective Functional Status/Changes: I'm hurting. I just had my knees injected.     TREATMENT AREA =  Decreased strength  Loss of balance    OBJECTIVE         Therapeutic Procedures:    Tx Min Billable or 1:1 Min (if diff from Tx Min) Procedure, Rationale, Specifics   12 12 97110 Therapeutic Exercise (timed):  increase ROM, strength, coordination, balance, and proprioception to improve patient's ability to progress to PLOF and address remaining functional goals. (see flow sheet as applicable)     Details if applicable:       20 20 97112 Neuromuscular Re-Education (timed):  improve balance, coordination, kinesthetic sense, posture, core stability and proprioception to improve patient's ability to develop conscious control of individual muscles and awareness of position of extremities in order to progress to PLOF and address remaining functional goals. (see flow sheet as applicable)     Details if applicable:     10 10 97530 Therapeutic Activity (timed):  use of dynamic activities replicating functional movements to increase ROM, strength, coordination, balance, and proprioception in order to improve patient's ability to progress to PLOF and address remaining functional goals.  (see flow sheet as applicable)     Details if applicable:     42 42 MC BC Totals Reminder: bill using total billable min of TIMED therapeutic procedures (example: do not include dry needle or estim unattended, both untimed codes, in totals to left)  8-22 min = 1 unit; 23-37 min = 2 units; 38-52 min = 3 units; 53-67 min = 4 units; 68-82 min = 5 units   Total Total     Charge Capture    [x]   Patient Education billed concurrently with other procedures    [x]  Review HEP    []  Progressed/Changed HEP, detail:    []  Other detail:       Objective Information/Functional Measures/Assessment    Pt continues to be slow and deliberate with her gait. Also continues to require frequent cuing for downward gaze. Held on most standing exercises secondary to patient coming straight to therapy after getting both knees injected. Required more assistance today with sit to stands at mod A from chair. Balance was challenged without UE support in NBOS with min A to maintain standing. Continue to progress as able.     Patient will continue to benefit from skilled PT / OT services to modify and progress therapeutic interventions, analyze and address functional mobility deficits, analyze and address ROM deficits, analyze and address strength deficits, analyze and address soft tissue restrictions, analyze and cue for proper movement patterns, analyze and modify for postural abnormalities, analyze and address imbalance/dizziness, and instruct in home and community integration to address functional deficits and attain remaining goals.    Progress toward goals / Updated goals:  []   See Progress Note/Recertification    Pt will be able to improve strength in Hip flexion, seated hip ER/Adduction; knee extension/flexion to at least 5/5 to improve patient's ability to get in/out of chair with efficiency at home &  community. (Initial LTG)                RC: Hip flex: 4+/5 B, knee ext 4+/5 R, 5/5 L; knee flex 5/5 B; seated hip ER 4+/5 B, seated hip ADD 5/5 B [Date assessed: 09/01/2024]      2.  Pt will improve LEFS score to 31 to demonstrate improvement in patient's ability to perform unrestricted ADLs/IADLs at home & community. (Initial LTG)                RC: 10 points [Date assessed: 09/01/2024]                Assess at 30 day mark [Date assessed: 09/06/2024]     3. Pt will report compliance with home HEP at end of care to enhance carry over of fuctional gains made with skilled therapy services in  order to improve quality of life. (Initial LTG) ]               RC: poor HEP compliance since x3 ER visits in the past few weeks [Date assessed: 09/01/2024]      4. Pt will improve TUG score to at least 26 seconds with FWW to demonstrate a decrease in patients falls risk. (Initial LTG)               RC: 1 min 37 sec -- with RW, slow, shuffled gait      5. Pt will improve her 5 time sit to stand time to least 23 seconds with UE to demonstrate improvement in her functional LE strength & decrease her risk for falls at home.               RC: 38 sec with BUE -- heavy BUE and not not reach full stand, keeping BUE on armrests [Date assessed: 09/01/2024]    Continues to be significantly challenged with sit to stands, requiring mod A today [Date assessed: 09/08/2024]    Next PN 09/30/2024  RC or Medicaid tracking due 10/29/2024  Auth due (visit number/ date) 9 visits remaining by 10/28/2024    PLAN  - Continue Plan of Care    Dorise Ni, PTA, CSCS    09/08/2024    2:08 PM  If an interpreting service was utilized for treatment of this patient, the contents of this document represent the material reviewed with the patient via the interpreter.     Future Appointments   Date Time Provider Department Center   09/08/2024  2:10 PM Ni Dorise, PT [facility]   09/13/2024 12:10 PM Fabiola Laymon HACKNEY [facility]   09/15/2024 12:10 PM Reather Chew, [place] [facility]   09/15/2024  1:50 PM Evlyn Elspeth BROCKS, MD [facility]   09/20/2024 12:10 PM Fabiola Laymon HACKNEY [facility]   09/22/2024 12:10 PM Ni Dorise, PT [facility]   09/27/2024 12:10 PM Ni Dorise, PT [facility]   09/29/2024 12:10 PM Rush Ivy, PT [facility]

## 2024-09-13 ENCOUNTER — Encounter: Payer: Medicare (Managed Care) | Primary: Physician Assistant

## 2024-09-15 ENCOUNTER — Encounter: Payer: Medicare (Managed Care) | Attending: Specialist | Primary: Physician Assistant

## 2024-09-15 ENCOUNTER — Encounter: Payer: Medicare (Managed Care) | Primary: Physician Assistant

## 2024-09-20 ENCOUNTER — Encounter: Payer: Medicare (Managed Care) | Primary: Physician Assistant

## 2024-09-22 ENCOUNTER — Encounter: Payer: Medicare (Managed Care) | Primary: Physician Assistant

## 2024-09-29 ENCOUNTER — Encounter: Payer: Medicare (Managed Care) | Primary: Physician Assistant

## 2024-10-19 ENCOUNTER — Ambulatory Visit: Admitting: Podiatry
